# Patient Record
Sex: Female | Born: 1940
Health system: Southern US, Community
[De-identification: ages and names within clinical notes are randomized; demographics above are authoritative.]

## PROBLEM LIST (undated history)

## (undated) DIAGNOSIS — I739 Peripheral vascular disease, unspecified: Secondary | ICD-10-CM

## (undated) DIAGNOSIS — D329 Benign neoplasm of meninges, unspecified: Secondary | ICD-10-CM

## (undated) DIAGNOSIS — E785 Hyperlipidemia, unspecified: Secondary | ICD-10-CM

## (undated) DIAGNOSIS — I639 Cerebral infarction, unspecified: Secondary | ICD-10-CM

## (undated) DIAGNOSIS — I4891 Unspecified atrial fibrillation: Secondary | ICD-10-CM

## (undated) DIAGNOSIS — L97929 Non-pressure chronic ulcer of unspecified part of left lower leg with unspecified severity: Secondary | ICD-10-CM

## (undated) DIAGNOSIS — C50919 Malignant neoplasm of unspecified site of unspecified female breast: Secondary | ICD-10-CM

## (undated) DIAGNOSIS — R0989 Other specified symptoms and signs involving the circulatory and respiratory systems: Secondary | ICD-10-CM

## (undated) DIAGNOSIS — L97919 Non-pressure chronic ulcer of unspecified part of right lower leg with unspecified severity: Secondary | ICD-10-CM

## (undated) DIAGNOSIS — I10 Essential (primary) hypertension: Secondary | ICD-10-CM

## (undated) DIAGNOSIS — M479 Spondylosis, unspecified: Secondary | ICD-10-CM

## (undated) DIAGNOSIS — G458 Other transient cerebral ischemic attacks and related syndromes: Secondary | ICD-10-CM

## (undated) DIAGNOSIS — D649 Anemia, unspecified: Secondary | ICD-10-CM

## (undated) DIAGNOSIS — J189 Pneumonia, unspecified organism: Secondary | ICD-10-CM

## (undated) DIAGNOSIS — I6529 Occlusion and stenosis of unspecified carotid artery: Secondary | ICD-10-CM

## (undated) HISTORY — DX: Other specified symptoms and signs involving the circulatory and respiratory systems: R09.89

## (undated) HISTORY — PX: EXPLORATORY LAPAROTOMY: SUR591

## (undated) HISTORY — DX: Other transient cerebral ischemic attacks and related syndromes: G45.8

## (undated) HISTORY — DX: Essential (primary) hypertension: I10

## (undated) HISTORY — DX: Malignant neoplasm of unspecified site of unspecified female breast: C50.919

## (undated) HISTORY — DX: Occlusion and stenosis of unspecified carotid artery: I65.29

## (undated) HISTORY — DX: Peripheral vascular disease, unspecified: I73.9

## (undated) HISTORY — DX: Hyperlipidemia, unspecified: E78.5

## (undated) HISTORY — DX: Cerebral infarction, unspecified: I63.9

## (undated) HISTORY — DX: Benign neoplasm of meninges, unspecified: D32.9

## (undated) HISTORY — PX: LUMBAR EPIDURAL INJECTION: SHX1980

## (undated) HISTORY — DX: Anemia, unspecified: D64.9

## (undated) HISTORY — PX: EYE SURGERY: SHX253

## (undated) HISTORY — DX: Unspecified atrial fibrillation: I48.91

## (undated) HISTORY — PX: CARDIAC CATHETERIZATION: SHX172

---

## 1988-04-09 HISTORY — PX: MASTECTOMY: SHX3

## 2006-08-10 ENCOUNTER — Inpatient Hospital Stay (HOSPITAL_COMMUNITY): Admission: EM | Admit: 2006-08-10 | Discharge: 2006-08-13 | Payer: Self-pay | Admitting: Emergency Medicine

## 2006-08-11 ENCOUNTER — Encounter: Payer: Self-pay | Admitting: Neurology

## 2006-08-12 ENCOUNTER — Encounter (INDEPENDENT_AMBULATORY_CARE_PROVIDER_SITE_OTHER): Payer: Self-pay | Admitting: Cardiology

## 2006-08-23 ENCOUNTER — Ambulatory Visit (HOSPITAL_COMMUNITY): Admission: RE | Admit: 2006-08-23 | Discharge: 2006-08-23 | Payer: Self-pay | Admitting: Interventional Radiology

## 2006-09-05 ENCOUNTER — Encounter: Payer: Self-pay | Admitting: Interventional Radiology

## 2006-09-09 ENCOUNTER — Encounter (HOSPITAL_COMMUNITY): Admission: RE | Admit: 2006-09-09 | Discharge: 2006-10-09 | Payer: Self-pay | Admitting: Internal Medicine

## 2006-11-26 ENCOUNTER — Ambulatory Visit (HOSPITAL_COMMUNITY): Admission: RE | Admit: 2006-11-26 | Discharge: 2006-11-26 | Payer: Self-pay | Admitting: Interventional Radiology

## 2006-12-17 ENCOUNTER — Ambulatory Visit (HOSPITAL_COMMUNITY): Admission: RE | Admit: 2006-12-17 | Discharge: 2006-12-17 | Payer: Self-pay | Admitting: Internal Medicine

## 2007-01-08 ENCOUNTER — Ambulatory Visit (HOSPITAL_COMMUNITY): Admission: RE | Admit: 2007-01-08 | Discharge: 2007-01-08 | Payer: Self-pay | Admitting: Internal Medicine

## 2007-07-22 ENCOUNTER — Ambulatory Visit (HOSPITAL_COMMUNITY): Admission: RE | Admit: 2007-07-22 | Discharge: 2007-07-22 | Payer: Self-pay | Admitting: Internal Medicine

## 2007-08-06 ENCOUNTER — Ambulatory Visit (HOSPITAL_COMMUNITY): Admission: RE | Admit: 2007-08-06 | Discharge: 2007-08-06 | Payer: Self-pay | Admitting: Orthopedic Surgery

## 2007-08-26 ENCOUNTER — Encounter (HOSPITAL_COMMUNITY): Admission: RE | Admit: 2007-08-26 | Discharge: 2007-09-25 | Payer: Self-pay | Admitting: Internal Medicine

## 2007-09-26 ENCOUNTER — Encounter (HOSPITAL_COMMUNITY): Admission: RE | Admit: 2007-09-26 | Discharge: 2007-10-26 | Payer: Self-pay | Admitting: Internal Medicine

## 2007-12-19 ENCOUNTER — Ambulatory Visit (HOSPITAL_COMMUNITY): Admission: RE | Admit: 2007-12-19 | Discharge: 2007-12-19 | Payer: Self-pay | Admitting: Internal Medicine

## 2008-01-15 ENCOUNTER — Ambulatory Visit (HOSPITAL_COMMUNITY): Admission: RE | Admit: 2008-01-15 | Discharge: 2008-01-15 | Payer: Self-pay | Admitting: Internal Medicine

## 2009-02-15 ENCOUNTER — Ambulatory Visit (HOSPITAL_COMMUNITY): Admission: RE | Admit: 2009-02-15 | Discharge: 2009-02-15 | Payer: Self-pay | Admitting: Internal Medicine

## 2009-06-28 ENCOUNTER — Ambulatory Visit (HOSPITAL_COMMUNITY): Admission: RE | Admit: 2009-06-28 | Discharge: 2009-06-28 | Payer: Self-pay | Admitting: Internal Medicine

## 2009-07-01 ENCOUNTER — Ambulatory Visit (HOSPITAL_COMMUNITY)
Admission: RE | Admit: 2009-07-01 | Discharge: 2009-07-01 | Payer: Self-pay | Source: Home / Self Care | Admitting: Internal Medicine

## 2009-07-18 ENCOUNTER — Ambulatory Visit: Payer: Self-pay | Admitting: Surgery

## 2009-10-10 ENCOUNTER — Ambulatory Visit: Payer: Self-pay | Admitting: Cardiology

## 2009-10-10 ENCOUNTER — Inpatient Hospital Stay (HOSPITAL_COMMUNITY): Admission: EM | Admit: 2009-10-10 | Discharge: 2009-10-20 | Payer: Self-pay | Admitting: Emergency Medicine

## 2009-10-11 ENCOUNTER — Telehealth: Payer: Self-pay | Admitting: Cardiovascular Disease

## 2009-10-12 ENCOUNTER — Encounter (INDEPENDENT_AMBULATORY_CARE_PROVIDER_SITE_OTHER): Payer: Self-pay | Admitting: Internal Medicine

## 2009-10-12 ENCOUNTER — Ambulatory Visit: Payer: Self-pay | Admitting: Vascular Surgery

## 2009-10-12 ENCOUNTER — Encounter: Payer: Self-pay | Admitting: Cardiovascular Disease

## 2009-10-15 ENCOUNTER — Encounter: Payer: Self-pay | Admitting: Cardiovascular Disease

## 2009-10-17 ENCOUNTER — Encounter: Payer: Self-pay | Admitting: Cardiovascular Disease

## 2009-10-20 ENCOUNTER — Telehealth: Payer: Self-pay | Admitting: Cardiovascular Disease

## 2009-10-20 ENCOUNTER — Encounter: Payer: Self-pay | Admitting: Cardiovascular Disease

## 2009-10-24 ENCOUNTER — Ambulatory Visit: Payer: Self-pay | Admitting: Cardiology

## 2009-10-26 ENCOUNTER — Ambulatory Visit: Payer: Self-pay | Admitting: Cardiovascular Disease

## 2009-10-26 DIAGNOSIS — I4819 Other persistent atrial fibrillation: Secondary | ICD-10-CM | POA: Insufficient documentation

## 2009-10-26 DIAGNOSIS — R55 Syncope and collapse: Secondary | ICD-10-CM | POA: Insufficient documentation

## 2009-10-26 DIAGNOSIS — I4891 Unspecified atrial fibrillation: Secondary | ICD-10-CM

## 2009-10-26 DIAGNOSIS — I5033 Acute on chronic diastolic (congestive) heart failure: Secondary | ICD-10-CM | POA: Insufficient documentation

## 2009-10-26 DIAGNOSIS — I10 Essential (primary) hypertension: Secondary | ICD-10-CM | POA: Insufficient documentation

## 2009-10-26 DIAGNOSIS — E785 Hyperlipidemia, unspecified: Secondary | ICD-10-CM | POA: Insufficient documentation

## 2009-10-27 ENCOUNTER — Encounter: Payer: Self-pay | Admitting: Cardiovascular Disease

## 2009-10-28 ENCOUNTER — Telehealth: Payer: Self-pay | Admitting: Cardiovascular Disease

## 2009-10-31 ENCOUNTER — Ambulatory Visit: Payer: Self-pay | Admitting: Cardiology

## 2009-11-01 DIAGNOSIS — R0989 Other specified symptoms and signs involving the circulatory and respiratory systems: Secondary | ICD-10-CM

## 2009-11-04 ENCOUNTER — Telehealth: Payer: Self-pay | Admitting: Cardiovascular Disease

## 2009-11-07 ENCOUNTER — Ambulatory Visit: Payer: Self-pay | Admitting: Cardiology

## 2009-11-21 ENCOUNTER — Ambulatory Visit: Payer: Self-pay | Admitting: Cardiology

## 2009-12-02 ENCOUNTER — Emergency Department (HOSPITAL_COMMUNITY): Admission: EM | Admit: 2009-12-02 | Discharge: 2009-12-02 | Payer: Self-pay | Admitting: Emergency Medicine

## 2009-12-05 ENCOUNTER — Ambulatory Visit: Payer: Self-pay | Admitting: Cardiology

## 2009-12-05 LAB — CONVERTED CEMR LAB: POC INR: 2.2

## 2009-12-19 ENCOUNTER — Ambulatory Visit: Payer: Self-pay | Admitting: Surgery

## 2009-12-19 ENCOUNTER — Encounter: Payer: Self-pay | Admitting: Cardiovascular Disease

## 2009-12-26 ENCOUNTER — Ambulatory Visit: Payer: Self-pay | Admitting: Cardiology

## 2009-12-26 LAB — CONVERTED CEMR LAB: POC INR: 2.1

## 2009-12-27 ENCOUNTER — Encounter: Payer: Self-pay | Admitting: Cardiovascular Disease

## 2009-12-29 ENCOUNTER — Encounter: Payer: Self-pay | Admitting: Cardiovascular Disease

## 2010-01-23 ENCOUNTER — Ambulatory Visit: Payer: Self-pay | Admitting: Cardiology

## 2010-01-23 LAB — CONVERTED CEMR LAB: POC INR: 3.6

## 2010-01-24 ENCOUNTER — Ambulatory Visit: Payer: Self-pay | Admitting: Cardiovascular Disease

## 2010-02-02 ENCOUNTER — Telehealth: Payer: Self-pay | Admitting: Cardiovascular Disease

## 2010-02-13 ENCOUNTER — Ambulatory Visit: Payer: Self-pay | Admitting: Cardiology

## 2010-02-13 LAB — CONVERTED CEMR LAB: POC INR: 3.7

## 2010-02-20 ENCOUNTER — Ambulatory Visit (HOSPITAL_COMMUNITY)
Admission: RE | Admit: 2010-02-20 | Discharge: 2010-02-20 | Payer: Self-pay | Source: Home / Self Care | Admitting: Internal Medicine

## 2010-03-06 ENCOUNTER — Ambulatory Visit: Payer: Self-pay | Admitting: Cardiology

## 2010-03-06 LAB — CONVERTED CEMR LAB: POC INR: 1.3

## 2010-03-13 ENCOUNTER — Ambulatory Visit: Payer: Self-pay | Admitting: Cardiology

## 2010-03-13 LAB — CONVERTED CEMR LAB: POC INR: 2.8

## 2010-04-05 ENCOUNTER — Ambulatory Visit: Payer: Self-pay | Admitting: Cardiology

## 2010-04-05 LAB — CONVERTED CEMR LAB: POC INR: 2.5

## 2010-04-30 ENCOUNTER — Encounter: Payer: Self-pay | Admitting: Internal Medicine

## 2010-05-03 ENCOUNTER — Ambulatory Visit: Admission: RE | Admit: 2010-05-03 | Discharge: 2010-05-03 | Payer: Self-pay | Source: Home / Self Care

## 2010-05-04 ENCOUNTER — Encounter (HOSPITAL_COMMUNITY)
Admission: RE | Admit: 2010-05-04 | Discharge: 2010-05-09 | Payer: Self-pay | Source: Home / Self Care | Attending: Internal Medicine | Admitting: Internal Medicine

## 2010-05-09 NOTE — Medication Information (Signed)
Summary: ccr-lr  Anticoagulant Therapy  Managed by: Vashti Hey, RN Referring MD: Eden Emms MD, Theron Arista PCP: Louanne Skye Supervising MD: Dietrich Pates MD, Molly Maduro Indication 1: Atrial Fibrillation Indication 2: Cerebrovascular Accident Lab Used: [facility] [place] Site: [address] INR POC 1.8 INR RANGE 2.0-3.0  Dietary changes: no    Health status changes: no    Bleeding/hemorrhagic complications: no    Recent/future hospitalizations: no    Any changes in medication regimen? no    Recent/future dental: no  Any missed doses?: no       Is patient compliant with meds? yes       Allergies: 1)  ! * Providone-Iodine 2)  ! Iodine 3)  ! * Contrast Dye  Anticoagulation Management History:      The patient is taking warfarin and comes in today for a routine follow up visit.  Positive risk factors for bleeding include an age of 72 years or older.  The bleeding index is 'intermediate risk'.  Positive CHADS2 values include History of CHF and History of HTN.  Negative CHADS2 values include Age > 72 years old.  Anticoagulation responsible provider: Dietrich Pates MD, Molly Maduro.  INR POC: 1.8.  Cuvette Lot#: [id number].  Exp: 01/2011.    Anticoagulation Management Assessment/Plan:      The patient's current anticoagulation dose is Coumadin 4 mg tabs: use as directed per Anticoagulation Clinic.  The next INR is due 11/21/2009.  Anticoagulation instructions were given to patient.  Results were reviewed/authorized by Vashti Hey, RN.  She was notified by Vashti Hey RN.         Prior Anticoagulation Instructions: INR 3.2 Decrease coumadin to 1 tablet once daily except 1/2 tablet on Mondays, Wednesdays and Fridays  Current Anticoagulation Instructions: INR 1.8 Increase coumadin to 4mg  once daily except 2mg  on Wednesdays

## 2010-05-09 NOTE — Letter (Signed)
Summary: [facility] OFFICE NOTE 12/19/09  [facility] OFFICE NOTE 12/19/09   Imported By: Faythe Ghee 12/29/2009 14:20:02  _____________________________________________________________________  External Attachment:    Type:   Image     Comment:   External Document

## 2010-05-09 NOTE — Medication Information (Signed)
Summary: ccr-lr  Anticoagulant Therapy  Managed by: Vashti Hey, RN Referring MD: Eden Emms MD, Theron Arista PCP: Louanne Skye Supervising MD: Diona Browner MD, Remi Deter Indication 1: Atrial Fibrillation Indication 2: Cerebrovascular Accident Lab Used: [facility] [place] Site: [place] INR POC 1.3 INR RANGE 2.0-3.0  Dietary changes: no    Health status changes: no    Bleeding/hemorrhagic complications: no    Recent/future hospitalizations: no    Any changes in medication regimen? no    Recent/future dental: no  Any missed doses?: no       Is patient compliant with meds? yes       Allergies: 1)  ! * Providone-Iodine 2)  ! Iodine 3)  ! * Contrast Dye  Anticoagulation Management History:      The patient is taking warfarin and comes in today for a routine follow up visit.  Positive risk factors for bleeding include an age of 72 years or older.  The bleeding index is 'intermediate risk'.  Positive CHADS2 values include History of CHF and History of HTN.  Negative CHADS2 values include Age > 50 years old.  Anticoagulation responsible provider: Diona Browner MD, Remi Deter.  INR POC: 1.3.  Cuvette Lot#: [id number].  Exp: 01/2011.    Anticoagulation Management Assessment/Plan:      The patient's current anticoagulation dose is Coumadin 4 mg tabs: use as directed per Anticoagulation Clinic.  The target INR is 2.0-3.0.  The next INR is due 03/13/2010.  Anticoagulation instructions were given to patient.  Results were reviewed/authorized by Vashti Hey, RN.  She was notified by Vashti Hey RN.         Prior Anticoagulation Instructions: INR 3.7 Hold coumadin tonight then decrease dose to 4mg  once daily except 2mg  on Mondays, Wednesdays and Fridays  Current Anticoagulation Instructions: INR 1.3 Take coumadin 1 1/2 tablets tonight then increase dose to 4mg  once daily except 2mg  on Mondays and Fridays

## 2010-05-09 NOTE — Progress Notes (Signed)
Summary: calling regarding dental work on Monday/ calling back   Phone Note Call from Patient Call back at [employer] [phone]   Caller: Patient Summary of Call: Pt having dental work on 02/06/10 and pt need clearance on what meds pt need to take call Dr.Hugh Dowdy # [phone] Initial call taken by: Judie Grieve,  February 02, 2010 9:07 AM  Follow-up for Phone Call        Left message to call back Deliah Goody, RN  February 02, 2010 3:33 PM   per brenda calling back office [phone] Lorne Skeens  February 03, 2010 8:48 AM   office closed at 1pm. Left message to call back Deliah Goody, RN  February 03, 2010 1:47 PM   Additional Follow-up for Phone Call Additional follow up Details #1::        spoke with brenda, okay given, per dr Eden Emms note, for pt to hold coumadin and plavix prior to dental procedures Deliah Goody, RN  February 06, 2010 9:21 AM

## 2010-05-09 NOTE — Medication Information (Signed)
Summary: ccr-lr  Anticoagulant Therapy  Managed by: Vashti Hey, RN Referring MD: Eden Emms MD, Theron Arista PCP: Louanne Skye Supervising MD: Dietrich Pates MD, Molly Maduro Indication 1: Atrial Fibrillation Indication 2: Cerebrovascular Accident Lab Used: [facility] [place] Site: [address] INR POC 2.1 INR RANGE 2.0-3.0  Dietary changes: no    Health status changes: no    Bleeding/hemorrhagic complications: no    Recent/future hospitalizations: no    Any changes in medication regimen? no    Recent/future dental: no  Any missed doses?: no       Is patient compliant with meds? yes       Allergies: 1)  ! * Providone-Iodine 2)  ! Iodine 3)  ! * Contrast Dye  Anticoagulation Management History:      The patient is taking warfarin and comes in today for a routine follow up visit.  Positive risk factors for bleeding include an age of 15 years or older.  The bleeding index is 'intermediate risk'.  Positive CHADS2 values include History of CHF and History of HTN.  Negative CHADS2 values include Age > 32 years old.  Anticoagulation responsible provider: Dietrich Pates MD, Molly Maduro.  INR POC: 2.1.  Cuvette Lot#: [id number].  Exp: 01/2011.    Anticoagulation Management Assessment/Plan:      The patient's current anticoagulation dose is Coumadin 4 mg tabs: use as directed per Anticoagulation Clinic.  The target INR is 2.0-3.0.  The next INR is due 01/23/2010.  Anticoagulation instructions were given to patient.  Results were reviewed/authorized by Vashti Hey, RN.  She was notified by Vashti Hey RN.         Prior Anticoagulation Instructions: INR 2.2 Continue coumadin 4mg  once daily except 2mg  on Wednesdays  Current Anticoagulation Instructions: INR 2.1 Continue coumadin 4mg  once daily except 2mg  on Wednesdays

## 2010-05-09 NOTE — Medication Information (Signed)
Summary: ccr-lr  Anticoagulant Therapy  Managed by: Vashti Hey, RN Referring MD: Eden Emms MD, Theron Arista PCP: Louanne Skye Supervising MD: Dietrich Pates MD, Molly Maduro Indication 1: Atrial Fibrillation Indication 2: Cerebrovascular Accident Lab Used: [facility] [place] Site: [address] INR POC 2.2 INR RANGE 2.0-3.0  Dietary changes: no    Health status changes: no    Bleeding/hemorrhagic complications: no    Recent/future hospitalizations: no    Any changes in medication regimen? no    Recent/future dental: no  Any missed doses?: no       Is patient compliant with meds? yes       Allergies: 1)  ! * Providone-Iodine 2)  ! Iodine 3)  ! * Contrast Dye  Anticoagulation Management History:      The patient is taking warfarin and comes in today for a routine follow up visit.  Positive risk factors for bleeding include an age of 72 years or older.  The bleeding index is 'intermediate risk'.  Positive CHADS2 values include History of CHF and History of HTN.  Negative CHADS2 values include Age > 46 years old.  Anticoagulation responsible provider: Dietrich Pates MD, Molly Maduro.  INR POC: 2.2.  Cuvette Lot#: [id number].  Exp: 01/2011.    Anticoagulation Management Assessment/Plan:      The patient's current anticoagulation dose is Coumadin 4 mg tabs: use as directed per Anticoagulation Clinic.  The target INR is 2.0-3.0.  The next INR is due 12/26/2009.  Anticoagulation instructions were given to patient.  Results were reviewed/authorized by Vashti Hey, RN.  She was notified by Vashti Hey RN.         Prior Anticoagulation Instructions: INR 2.3 Continue coumadin 4mg  once daily except 2mg  on Wednesdays  Current Anticoagulation Instructions: INR 2.2 Continue coumadin 4mg  once daily except 2mg  on Wednesdays

## 2010-05-09 NOTE — Medication Information (Signed)
Summary: ccr-lr  Anticoagulant Therapy  Managed by: Vashti Hey, RN Referring MD: Eden Emms MD, Theron Arista PCP: Louanne Skye Supervising MD: Dietrich Pates MD, Molly Maduro Indication 1: Atrial Fibrillation Indication 2: Cerebrovascular Accident Lab Used: [facility] [place] Site: [place] INR POC 3.7 INR RANGE 2.0-3.0  Dietary changes: no    Health status changes: no    Bleeding/hemorrhagic complications: no    Recent/future hospitalizations: no    Any changes in medication regimen? no    Recent/future dental: no  Any missed doses?: no       Is patient compliant with meds? yes       Allergies: 1)  ! * Providone-Iodine 2)  ! Iodine 3)  ! * Contrast Dye  Anticoagulation Management History:      The patient is taking warfarin and comes in today for a routine follow up visit.  Positive risk factors for bleeding include an age of 72 years or older.  The bleeding index is 'intermediate risk'.  Positive CHADS2 values include History of CHF and History of HTN.  Negative CHADS2 values include Age > 72 years old.  Anticoagulation responsible Darria Corvera: Dietrich Pates MD, Molly Maduro.  INR POC: 3.7.  Cuvette Lot#: [id number].  Exp: 01/2011.    Anticoagulation Management Assessment/Plan:      The patient's current anticoagulation dose is Coumadin 4 mg tabs: use as directed per Anticoagulation Clinic.  The target INR is 2.0-3.0.  The next INR is due 03/06/2010.  Anticoagulation instructions were given to patient.  Results were reviewed/authorized by Vashti Hey, RN.  She was notified by Vashti Hey RN.         Prior Anticoagulation Instructions: INR 3.6 Hold coumadin tonight then resume 1 tablet once daily except 1/2 tablet on Wednesdays  Current Anticoagulation Instructions: INR 3.7 Hold coumadin tonight then decrease dose to 4mg  once daily except 2mg  on Mondays, Wednesdays and Fridays

## 2010-05-09 NOTE — Letter (Signed)
Summary: [facility]   Imported By: Roderic Ovens 11/07/2009 15:31:31  _____________________________________________________________________  External Attachment:    Type:   Image     Comment:   External Document

## 2010-05-09 NOTE — Letter (Signed)
Summary: CAROTID DUPLEX 12/19/09  CAROTID DUPLEX 12/19/09   Imported By: Faythe Ghee 12/29/2009 14:20:29  _____________________________________________________________________  External Attachment:    Type:   Image     Comment:   External Document

## 2010-05-09 NOTE — Medication Information (Signed)
Summary: ccr  Anticoagulant Therapy  Managed by: Vashti Hey, RN Referring MD: Eden Emms MD, Theron Arista PCP: Louanne Skye Supervising MD: Dietrich Pates MD, Molly Maduro Indication 1: Atrial Fibrillation Indication 2: Cerebrovascular Accident Lab Used: [facility] [place] Site: [address] INR POC 3.2 INR RANGE 2.0-3.0  Dietary changes: no    Health status changes: yes       Details: nausea when taking pills   told to take with food  Bleeding/hemorrhagic complications: no    Recent/future hospitalizations: no    Any changes in medication regimen? no    Recent/future dental: no  Any missed doses?: no       Is patient compliant with meds? yes       Allergies: 1)  ! * Providone-Iodine 2)  ! Iodine 3)  ! * Contrast Dye  Anticoagulation Management History:      The patient is taking warfarin and comes in today for a routine follow up visit.  Positive risk factors for bleeding include an age of 72 years or older.  The bleeding index is 'intermediate risk'.  Positive CHADS2 values include History of CHF and History of HTN.  Negative CHADS2 values include Age > 72 years old.  Anticoagulation responsible provider: Dietrich Pates MD, Molly Maduro.  INR POC: 3.2.  Cuvette Lot#: [id number].  Exp: 01/2011.    Anticoagulation Management Assessment/Plan:      The patient's current anticoagulation dose is Coumadin 4 mg tabs: use as directed per Anticoagulation Clinic.  The next INR is due 11/07/2009.  Anticoagulation instructions were given to patient/spouse.  Results were reviewed/authorized by Vashti Hey, RN.  She was notified by Vashti Hey RN.        Coagulation management information includes: On amiodarone 200mg  bid.  Prior Anticoagulation Instructions: INR 2.2 Continue 4mg s everyday. Recheck INR in one week at [facility] office.  Discontinue Lovenox injections.   Current Anticoagulation Instructions: INR 3.2 Decrease coumadin to 1 tablet once daily except 1/2 tablet on Mondays, Wednesdays and Fridays

## 2010-05-09 NOTE — Progress Notes (Signed)
Summary: pt needs carotid  Phone Note Call from Patient Call back at Home Phone [phone]   Caller: Patient Reason for Call: Talk to Nurse, Talk to Doctor Summary of Call: pt was seen by Dr. Eden Emms in the [place] office on 7/20 her next appt with him is 10/18 @ 9am. Pt explained that Dr. Eden Emms told her to have a carotid study done at Dr. Myra Gianotti office but patient was told she needed a referral and I explained that we could do that study here at out office but she said that is not what Dr. Eden Emms wanted. I explained to patient I did not know why so I would send a message and we would ask Dr. Eden Emms. Initial call taken by: Omer Jack,  October 28, 2009 4:26 PM  Follow-up for Phone Call        Spoke with pt. Patient states Dr. Eden Emms agreed for  pt. to have a Carotid duplex that  Dr. Linnell Fulling recommeded for pt. to have one in his office. According to pt. Dr. Dwana Melena from [place]  referred pt. to Dr. Linnell Fulling Vascular vein specialist  in april/11.  Because Dr. Eden Emms wants the  test  be done before Oct.18th his office told pt. she needs a referral from Dr. Fabio Bering opffice. The vascular office is closed at this time. I let pt. know we  will  call next week with referral. Pt. verbalized understanding.  Follow-up by: Ollen Gross, RN, BSN,  October 28, 2009 5:26 PM  Additional Follow-up for Phone Call Additional follow up Details #1::        Patient should have a referral to [facility] from vascular/CVTS for known inominate stenosis and bilateral carotid disease.  Would lkie her to have carotid and subclavian duplex at his office so he can review.  Can use my name as referral. Additional Follow-up by: Colon Branch, MD, [facility],  October 29, 2009 3:33 PM     Appended Document: pt needs carotid    Clinical Lists Changes  Problems: Added new problem of CAROTID STENOSIS (ICD-433.10) Added new problem of CAROTID ARTERY STENOSIS, BILATERAL (ICD-433.10) Orders: Added new Referral order of  [facility] Referral ([facility] Ref) - Signed      Appended Document: pt needs carotid LMOM giving instructions that we will call with appt date and time for Dr. Lear Ng

## 2010-05-09 NOTE — Medication Information (Signed)
Summary: ccr-lr  Anticoagulant Therapy  Managed by: Vashti Hey, RN Referring MD: Eden Emms MD, Theron Arista PCP: Louanne Skye Supervising MD: Diona Browner MD, Remi Deter Indication 1: Atrial Fibrillation Indication 2: Cerebrovascular Accident Lab Used: [facility] [place] Site: [place] INR POC 2.8 INR RANGE 2.0-3.0           Allergies: 1)  ! * Providone-Iodine 2)  ! Iodine 3)  ! * Contrast Dye  Anticoagulation Management History:      The patient is taking warfarin and comes in today for a routine follow up visit.  Positive risk factors for bleeding include an age of 72 years or older.  The bleeding index is 'intermediate risk'.  Positive CHADS2 values include History of CHF and History of HTN.  Negative CHADS2 values include Age > 72 years old.  Anticoagulation responsible provider: Diona Browner MD, Remi Deter.  INR POC: 2.8.  Cuvette Lot#: [id number].  Exp: 01/2011.    Anticoagulation Management Assessment/Plan:      The patient's current anticoagulation dose is Coumadin 4 mg tabs: use as directed per Anticoagulation Clinic.  The target INR is 2.0-3.0.  The next INR is due 04/05/2010.  Anticoagulation instructions were given to patient.  Results were reviewed/authorized by Vashti Hey, RN.  She was notified by Vashti Hey RN.         Prior Anticoagulation Instructions: INR 1.3 Take coumadin 1 1/2 tablets tonight then increase dose to 4mg  once daily except 2mg  on Mondays and Fridays  Current Anticoagulation Instructions: INR 2.8 Continue coumadin 4mg  once daily except 2mg  on Mondays and Fridays

## 2010-05-09 NOTE — Medication Information (Signed)
Summary: rov/sp  Anticoagulant Therapy  Managed by: Bethena Midget, RN, BSN Referring MD: Eden Emms MD, Lavera Guise MD: Antoine Poche MD, Fayrene Fearing Indication 1: Atrial Fibrillation Indication 2: Cerebrovascular Accident Lab Used: [facility] [place] Site: [address] INR POC 2.2 INR RANGE 2.0-3.0          Comments: Discharged home on Thursday. Started on 7/11 with 4gms daily. Discharged home on Lovenox two times a day 60mg s. Discharged home on New meds: Norvasc, Amiodarone 200mg s two times a day, Metoprolol 75mg s BID  Current Medications (verified): 1)  Amiodarone Hcl 200 Mg Tabs (Amiodarone Hcl) .... Take 1 Tablet By Mouth Twice Daily 2)  Norvasc 5 Mg Tabs (Amlodipine Besylate) .... Take 1 Tablet By Mouth Daily 3)  Norco 5-325 Mg Tabs (Hydrocodone-Acetaminophen) .... Take 1 Tablet By Mouth Every 6 Hours As Needed 4)  Metoprolol Tartrate 25 Mg Tabs (Metoprolol Tartrate) .... Take 3 Tablets By Mouth Twice Daily 5)  Aspirin 81 Mg Tbec (Aspirin) .... Take 1 Tablet By Mouth Every Morning 6)  Calcium-Vitamin D 250-125 Mg-Unit Tabs (Calcium Carbonate-Vitamin D) .... Take 1 Tablet By Mouth Twice Daily 7)  Fosamax 70 Mg Tabs (Alendronate Sodium) .... Take 1 Tablet By Mouth Weekly 8)  Mobic 15 Mg Tabs (Meloxicam) .... Take 1 Tablet By Mouth Every Morning 9)  Multivitamins  Caps (Multiple Vitamin) .... Take 1 Tablet By Mouth Once Daily 10)  Plavix 75 Mg Tabs (Clopidogrel Bisulfate) .... Take One Tablet By Mouth Every Morning 11)  Pravachol 40 Mg Tabs (Pravastatin Sodium) .... Take 1 Tablet By Mouth At Bedtime 12)  Ascorbic Acid 500 Mg Tabs (Ascorbic Acid) .... Take 1 Tablet By Mouth Every Morning 13)  Welchol 625 Mg Tabs (Colesevelam Hcl) .... Take 3 Tablets By Mouth Twice Daily  Allergies (verified): 1)  ! * Providone-Iodine 2)  ! Iodine 3)  ! * Contrast Dye  Anticoagulation Management History:      The patient comes in today for her initial visit for anticoagulation  therapy.  Positive risk factors for bleeding include an age of 72 years or older.  The bleeding index is 'intermediate risk'.  Negative CHADS2 values include Age > 75 years old.  Anticoagulation responsible provider: Antoine Poche MD, Fayrene Fearing.  INR POC: 2.2.  Cuvette Lot#: [id number].  Exp: 01/2011.    Anticoagulation Management Assessment/Plan:      The next INR is due 10/31/2009.  Anticoagulation instructions were given to patient/spouse.  Results were reviewed/authorized by Bethena Midget, RN, BSN.  She was notified by Bethena Midget, RN, BSN.         Current Anticoagulation Instructions: INR 2.2 Continue 4mg s everyday. Recheck INR in one week at [facility] office.  Discontinue Lovenox injections.

## 2010-05-09 NOTE — Assessment & Plan Note (Signed)
Summary: f51m/jml   Primary Chrishonda Hesch:  Timothy Lasso Hall,MD   History of Present Illness: Brittley is seen post hospital D/C  It appears that she was consulted on by Dr Daleen Squibb and cathed by Dr Alice Reichert.  She has known vascular disease primarily in the sublavian and carotids.  She was admitted with syncope.  She had PAF with diastolic heart failure.  Her cath showed only diagonal disease with no critical major vessel disease.  EF was normal.  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  She has 60-79% stenosis on left ICA.  In combination with rapid atrial fibrillation this may have contributed to a brief loss of consdousness.  Her diastolic CHF cleared with restoration of NSR.  She was sent home on low dose amiodarone.  She has had no SOB, or palpitations since being home. there have been no bleeding problems.  She denies SSCP, she wants to get back to her gym activities  She needs a root canal in the near future.  She should be ok with this but Plavix and coumadin will likely need to be held 3-5 days ahead of time.  I think she can have sedation in office but she is chronically on Norco so lower doses should be used.  Current Problems (verified): 1)  Carotid Artery Stenosis, Bilateral  (ICD-433.10) 2)  Carotid Stenosis  (ICD-433.10) 3)  Acute On Chronic Diastolic Heart Failure  (ICD-428.33) 4)  Atrial Fibrillation  (ICD-427.31) 5)  Hyperlipidemia  (ICD-272.4) 6)  Hypertension  (ICD-401.9) 7)  Syncope and Collapse  (ICD-780.2)  Current Medications (verified): 1)  Amiodarone Hcl 200 Mg Tabs (Amiodarone Hcl) .... Take 1 Tablet By Mouth Twice Daily 2)  Norvasc 5 Mg Tabs (Amlodipine Besylate) .... Take 1 Tablet By Mouth Daily 3)  Norco 5-325 Mg Tabs (Hydrocodone-Acetaminophen) .... Take 1 Tablet By Mouth Every 6 Hours As Needed 4)  Metoprolol Tartrate 25 Mg Tabs (Metoprolol Tartrate) .... Take 3 Tablets By Mouth Twice Daily 5)  Aspirin 81  Mg Tbec (Aspirin) .... Take 1 Tablet By Mouth Every Morning 6)  Calcium-Vitamin D 250-125 Mg-Unit Tabs (Calcium Carbonate-Vitamin D) .... Take 1 Tablet By Mouth Twice Daily 7)  Fosamax 70 Mg Tabs (Alendronate Sodium) .... Take 1 Tablet By Mouth Weekly 8)  Mobic 15 Mg Tabs (Meloxicam) .... Take 1 Tablet By Mouth Every Morning 9)  Multivitamins  Caps (Multiple Vitamin) .... Take 1 Tablet By Mouth Once Daily 10)  Plavix 75 Mg Tabs (Clopidogrel Bisulfate) .... Take One Tablet By Mouth Every Morning 11)  Pravachol 40 Mg Tabs (Pravastatin Sodium) .... Take 1 Tablet By Mouth At Bedtime 12)  Ascorbic Acid 500 Mg Tabs (Ascorbic Acid) .... Take 1 Tablet By Mouth Every Morning 13)  Welchol 625 Mg Tabs (Colesevelam Hcl) .... Take 3 Tablets By Mouth Twice Daily 14)  Coumadin 4 Mg Tabs (Warfarin Sodium) .... Use As Directed Per Anticoagulation Clinic 15)  Fish Oil 1200 Mg Caps (Omega-3 Fatty Acids) .... Take 1 Tablet By Mouth Once A Day 16)  Diovan 160 Mg Tabs (Valsartan) .... Take One Tablet By Mouth Daily  Allergies (verified): 1)  ! * Providone-Iodine 2)  ! Iodine 3)  ! * Contrast Dye  Past History:  Past Medical History: Last updated: 10/26/2009  1. Hypertension.   2. Osteoporosis.   3. Hyperlipidemia.   4. History of right frontal CVA in 2008 followed by Dr. Sandria Manly.   5. History  of breast cancer status post left mastectomy.   6. History of left occipital meningioma.   7. 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  The patient also noted to have low       blood pressure in the right arm.      Past Surgical History: Last updated: 2009/11/03 Cardiac Catheterization  Family History: Last updated: Nov 03, 2009     Her mother died at 59 from complications of Alzheimer   dementia.  Father died at 16 from complications of Parkinson disease.   Social History: Last  updated: 11/03/09     The patient is married.  She has 2 children.  She moved   from [place] to [place].  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  She quit tobacco after she had a stroke and now she drinks   occasional alcohol.   Review of Systems       Denies fever, malais, weight loss, blurry vision, decreased visual acuity, cough, sputum, SOB, hemoptysis, pleuritic pain, palpitaitons, heartburn, abdominal pain, melena, lower extremity edema, claudication, or rash.   Vital Signs:  Patient profile:   70 year old female Height:      65 inches Weight:      127 pounds BMI:     21.21 Pulse rate:   72 / minute Resp:     12 per minute BP sitting:   180 / 98  (left arm)  Vitals Entered By: Kem Parkinson (January 24, 2010 9:10 AM)  Physical Exam  General:  Affect appropriate Healthy:  appears stated age HEENT: normal Neck supple with no adenopathy JVP normal left  bruits no thyromegaly Lungs clear with no wheezing and good diaphragmatic motion Heart:  S1/S2 no murmur,rub, gallop or click PMI normal Abdomen: benighn, BS positve, no tenderness, no AAA no bruit.  No HSM or HJR Distal pulses intact with no bruits No edema Neuro non-focal Skin warm and dry Tremor   Impression & Recommendations:  Problem # 1:  CAROTID ARTERY STENOSIS, BILATERAL (ICD-433.10) 60-79% left ICA  F/U duplex VVS in 6 months Her updated medication list for this problem includes:    Aspirin 81 Mg Tbec (Aspirin) .Marland Kitchen... Take 1 tablet by mouth every morning    Plavix 75 Mg Tabs (Clopidogrel bisulfate) .Marland Kitchen... Take one tablet by mouth every morning    Coumadin 4 Mg Tabs (Warfarin sodium) ..... Use as directed per anticoagulation clinic  Problem # 2:  ACUTE ON CHRONIC DIASTOLIC HEART FAILURE (ICD-428.33) Resolved Continue Rx BP and maint of NSR will be key Her updated medication list for this problem includes:    Amiodarone Hcl 200 Mg Tabs (Amiodarone hcl) .Marland Kitchen... Take 1 tablet by mouth  twice daily    Norvasc 5 Mg Tabs (Amlodipine besylate) .Marland Kitchen... Take 1 tablet by mouth daily    Metoprolol Tartrate 25 Mg Tabs (Metoprolol tartrate) .Marland Kitchen... Take 3 tablets by mouth twice daily    Aspirin 81 Mg Tbec (Aspirin) .Marland Kitchen... Take 1 tablet by mouth every morning    Plavix 75 Mg Tabs (Clopidogrel bisulfate) .Marland Kitchen... Take one tablet by mouth every morning    Coumadin 4 Mg Tabs (Warfarin sodium) ..... Use as directed per anticoagulation clinic    Diovan 160 Mg Tabs (Valsartan) .Marland Kitchen... Take one tablet by mouth daily  Problem # 3:  ATRIAL FIBRILLATION (ICD-427.31) Continue low dose amiodarone.  LFT's Thyroid and DLCO in 6 months Her updated medication list for this problem includes:    Amiodarone Hcl 200 Mg Tabs (Amiodarone hcl) .Marland Kitchen... Take 1 tablet by mouth twice daily    Metoprolol Tartrate 25 Mg Tabs (Metoprolol tartrate) .Marland Kitchen... Take 3 tablets by mouth twice daily    Aspirin 81 Mg Tbec (Aspirin) .Marland Kitchen... Take 1 tablet by mouth every morning    Plavix 75 Mg Tabs (Clopidogrel bisulfate) .Marland Kitchen... Take one tablet by mouth every morning    Coumadin 4 Mg Tabs (Warfarin sodium) ..... Use as directed per anticoagulation clinic  Problem # 4:  HYPERLIPIDEMIA (ICD-272.4) Well controlled Her updated medication list for this problem includes:    Pravachol 40 Mg Tabs (Pravastatin sodium) .Marland Kitchen... Take 1 tablet by mouth at bedtime    Welchol 625 Mg Tabs (Colesevelam hcl) .Marland Kitchen... Take 3 tablets by mouth twice daily  Problem # 5:  HYPERTENSION (ICD-401.9) Continieu Diovan and low sodium diet Her updated medication list for this problem includes:    Norvasc 5 Mg Tabs (Amlodipine besylate) .Marland Kitchen... Take 1 tablet by mouth daily    Metoprolol Tartrate 25 Mg Tabs (Metoprolol tartrate) .Marland Kitchen... Take 3 tablets by mouth twice daily    Aspirin 81 Mg Tbec (Aspirin) .Marland Kitchen... Take 1 tablet by mouth every morning    Diovan 160 Mg Tabs (Valsartan) .Marland Kitchen... Take one tablet by mouth daily  Problem # 6:  SYNCOPE AND COLLAPSE (ICD-780.2) Non  recurrent.  At cerebral risk with right innominate occlusion and Left ICA diseae.  No significant arrtythmias  Aviod dehydration Her updated medication list for this problem includes:    Amiodarone Hcl 200 Mg Tabs (Amiodarone hcl) .Marland Kitchen... Take 1 tablet by mouth twice daily    Norvasc 5 Mg Tabs (Amlodipine besylate) .Marland Kitchen... Take 1 tablet by mouth daily    Metoprolol Tartrate 25 Mg Tabs (Metoprolol tartrate) .Marland Kitchen... Take 3 tablets by mouth twice daily    Aspirin 81 Mg Tbec (Aspirin) .Marland Kitchen... Take 1 tablet by mouth every morning    Plavix 75 Mg Tabs (Clopidogrel bisulfate) .Marland Kitchen... Take one tablet by mouth every morning    Coumadin 4 Mg Tabs (Warfarin sodium) ..... Use as directed per anticoagulation clinic  Patient Instructions: 1)  Your physician recommends that you schedule a follow-up appointment in:  6 MONTHS WITH DR Eden Emms 2)  Your physician recommends that you continue on your current medications as directed. Please refer to the Current Medication list given to you today. Prescriptions: DIOVAN 160 MG TABS (VALSARTAN) Take one tablet by mouth daily  #90 x 3   Entered by:   Scherrie Bateman, LPN   Authorized by:   Colon Branch, MD, [facility]   Signed by:   Scherrie Bateman, LPN on 04/54/0981   Method used:   Electronically to        [facility]* (mail-order)       [address]       [place], [place]  [address]       Ph: [phone]       Fax: [phone]   RxID:   [id number]

## 2010-05-09 NOTE — Medication Information (Signed)
Summary: ccr-lr  Anticoagulant Therapy  Managed by: Colleen Hey, RN Referring MD: Eden Emms MD, Theron Arista PCP: Louanne Skye Supervising MD: Dietrich Pates MD, Molly Maduro Indication 1: Atrial Fibrillation Indication 2: Cerebrovascular Accident Lab Used: [facility] [place] Site: [address] INR POC 2.3 INR RANGE 2.0-3.0  Dietary changes: no    Health status changes: no    Bleeding/hemorrhagic complications: no    Recent/future hospitalizations: no    Any changes in medication regimen? no    Recent/future dental: no  Any missed doses?: no       Is patient compliant with meds? yes       Allergies: 1)  ! * Providone-Iodine 2)  ! Iodine 3)  ! * Contrast Dye  Anticoagulation Management History:      The patient is taking warfarin and comes in today for a routine follow up visit.  Positive risk factors for bleeding include an age of 20 years or older.  The bleeding index is 'intermediate risk'.  Positive CHADS2 values include History of CHF and History of HTN.  Negative CHADS2 values include Age > 63 years old.  Anticoagulation responsible Berenize Gatlin: Dietrich Pates MD, Molly Maduro.  INR POC: 2.3.  Cuvette Lot#: [id number].  Exp: 01/2011.    Anticoagulation Management Assessment/Plan:      The patient's current anticoagulation dose is Coumadin 4 mg tabs: use as directed per Anticoagulation Clinic.  The target INR is 2.0-3.0.  The next INR is due 12/05/2009.  Anticoagulation instructions were given to patient.  Results were reviewed/authorized by Colleen Hey, RN.  She was notified by Colleen Hey RN.         Prior Anticoagulation Instructions: INR 1.8 Increase coumadin to 4mg  once daily except 2mg  on Wednesdays   Current Anticoagulation Instructions: INR 2.3 Continue coumadin 4mg  once daily except 2mg  on Wednesdays

## 2010-05-09 NOTE — Medication Information (Signed)
Summary: ccr-lr  Anticoagulant Therapy  Managed by: Vashti Hey, RN Referring MD: Eden Emms MD, Theron Arista PCP: Louanne Skye Supervising MD: Diona Browner MD, Remi Deter Indication 1: Atrial Fibrillation Indication 2: Cerebrovascular Accident Lab Used: [facility] [place] Site: [address] INR POC 3.6 INR RANGE 2.0-3.0  Dietary changes: no    Health status changes: no    Bleeding/hemorrhagic complications: no    Recent/future hospitalizations: no    Any changes in medication regimen? no    Recent/future dental: no  Any missed doses?: no       Is patient compliant with meds? yes       Allergies: 1)  ! * Providone-Iodine 2)  ! Iodine 3)  ! * Contrast Dye  Anticoagulation Management History:      The patient is taking warfarin and comes in today for a routine follow up visit.  Positive risk factors for bleeding include an age of 72 years or older.  The bleeding index is 'intermediate risk'.  Positive CHADS2 values include History of CHF and History of HTN.  Negative CHADS2 values include Age > 72 years old.  Anticoagulation responsible Jamai Dolce: Diona Browner MD, Remi Deter.  INR POC: 3.6.  Cuvette Lot#: [id number].  Exp: 01/2011.    Anticoagulation Management Assessment/Plan:      The patient's current anticoagulation dose is Coumadin 4 mg tabs: use as directed per Anticoagulation Clinic.  The target INR is 2.0-3.0.  The next INR is due 02/13/2010.  Anticoagulation instructions were given to patient.  Results were reviewed/authorized by Vashti Hey, RN.  She was notified by Vashti Hey RN.         Prior Anticoagulation Instructions: INR 2.1 Continue coumadin 4mg  once daily except 2mg  on Wednesdays  Current Anticoagulation Instructions: INR 3.6 Hold coumadin tonight then resume 1 tablet once daily except 1/2 tablet on Wednesdays

## 2010-05-09 NOTE — Assessment & Plan Note (Signed)
Summary: eph/per pt request/jml   Visit Type:  post cath Primary Provider:  Zack Hall,MD   History of Present Illness: [name] is seen post hospital D/C  It appears that she was consulted on by Dr Daleen Squibb and cathed by Dr Alice Reichert.  She has known vascular disease primarily in the sublavian and carotids.  She was admitted with syncope.  She had PAF with diastolic heart failure.  Her cath showed only diagonal disease with no critical major vessel disease.  EF was normal.  She has F/U with Dr Myra Gianotti in October.  She has an occluded inominate on the right and had failed previous stenting attempts.  She has 60-79% stenosis on left ICA.  In combination with rapid atrial fibrillation this may have contributed to a brief loss of consdousness.  Her diastolic CHF cleared with restoration of NSR.  She was sent home on low dose amiodarone.  She has had no SOB, or palpitations since being home.  Her INR on Monday in the [place] office was 2.2.  She will see Misty Stanley in [place] this Monday.  there have been no bleeding problems.  She denies SSCP, she wants to get back to her gym activities.  I told her from a cardiac perspective there was no reason to prohibit driving since she is in NSR, has normal EF and no critical CAD.  However she will have to have her primary or neuro sign off on driving sine she had a "syncopal" episode  Preventive Screening-Counseling & Management  Alcohol-Tobacco     Smoking Status: quit     Year Quit: 2008     Pack years: 1 &1/2 PPD x's 47yrs  Current Problems (verified): None  Current Medications (verified): 1)  Amiodarone Hcl 200 Mg Tabs (Amiodarone Hcl) .... Take 1 Tablet By Mouth Twice Daily 2)  Norvasc 5 Mg Tabs (Amlodipine Besylate) .... Take 1 Tablet By Mouth Daily 3)  Norco 5-325 Mg Tabs (Hydrocodone-Acetaminophen) .... Take 1 Tablet By Mouth Every 6 Hours As Needed 4)  Metoprolol Tartrate 25 Mg Tabs (Metoprolol Tartrate) .... Take 3 Tablets By Mouth Twice Daily 5)   Aspirin 81 Mg Tbec (Aspirin) .... Take 1 Tablet By Mouth Every Morning 6)  Calcium-Vitamin D 250-125 Mg-Unit Tabs (Calcium Carbonate-Vitamin D) .... Take 1 Tablet By Mouth Twice Daily 7)  Fosamax 70 Mg Tabs (Alendronate Sodium) .... Take 1 Tablet By Mouth Weekly 8)  Mobic 15 Mg Tabs (Meloxicam) .... Take 1 Tablet By Mouth Every Morning 9)  Multivitamins  Caps (Multiple Vitamin) .... Take 1 Tablet By Mouth Once Daily 10)  Plavix 75 Mg Tabs (Clopidogrel Bisulfate) .... Take One Tablet By Mouth Every Morning 11)  Pravachol 40 Mg Tabs (Pravastatin Sodium) .... Take 1 Tablet By Mouth At Bedtime 12)  Ascorbic Acid 500 Mg Tabs (Ascorbic Acid) .... Take 1 Tablet By Mouth Every Morning 13)  Welchol 625 Mg Tabs (Colesevelam Hcl) .... Take 3 Tablets By Mouth Twice Daily 14)  Coumadin 4 Mg Tabs (Warfarin Sodium) .... Use As Directed Per Anticoagulation Clinic 15)  Fish Oil 1200 Mg Caps (Omega-3 Fatty Acids) .... Take 1 Tablet By Mouth Once A Day  Allergies (verified): 1)  ! * Providone-Iodine 2)  ! Iodine 3)  ! * Contrast Dye  Comments:  Nurse/Medical Assistant: The patient's medication list and allergies were reviewed with the patient and were updated in the Medication and Allergy Lists.  Past History:  Past Medical History: CVA PAF Diastolic Dysfunciton Right inominate occlusion Breast CA Hypercholesterolemia  Social History: Smoking Status:  quit Pack years:  1 &1/2 PPD x's 4yrs  Vital Signs:  Patient profile:   70 year old female Height:      65 inches Weight:      127 pounds BMI:     21.21 Pulse rate:   77 / minute BP sitting:   121 / 74  (left arm) Cuff size:   large  Vitals Entered By: Carlye Grippe (October 26, 2009 9:47 AM)  Physical Exam  General:  Affect appropriate Healthy:  appears stated age HEENT: normal Neck supple with no adenopathy JVP normal bilateral carotid and subclavian bruits no thyromegaly Lungs clear with no wheezing and good diaphragmatic  motion Heart:  S1/S2 no murmur,rub, gallop or click PMI normal Abdomen: benighn, BS positve, no tenderness, no AAA no bruit.  No HSM or HJR Distal pulses intact with no bruits No edema Neuro non-focal Skin warm and dry BP 80mm Hg on right arm and 120 mmHg on left    EKG  Procedure date:  10/12/2009  Findings:       Study Conclusions    - Left ventricle: The cavity size was normal. Wall thickness was     increased in a pattern of mild LVH. Systolic function was normal.     The estimated ejection fraction was in the range of 55% to 60%.     Diastolic function was indeterminant. Wall motion was normal;     there were no regional wall motion abnormalities.   - Aortic valve: There was no stenosis.   - Mitral valve: Mild regurgitation.   - Left atrium: The atrium was mildly dilated.   - Right ventricle: The cavity size was normal. Systolic function was     normal.   - Right atrium: The atrium was mildly dilated.   - Tricuspid valve: Peak RV-RA gradient: 34mm Hg (S).   - Pulmonary arteries: Based on estimated RA pressure of 16-20 mmHg     from dilated IVC, PA systolic pressure would be 50-54 mmHg and PA     diastolic pressure would be 26-30 mmHg.   - Systemic veins: The IVC was dilated to 2.7 cm with some     respirophasic variation, suggesting RA pressure 16-20 mmHg.   Impressions:    - Normal LV size with mild LV hypertrophy. EF 55-60% without wall     motion abnormalities. The RV was normal in size and systolic     function. The IVC was dilated to 2.7 cm. There was estimated to be     moderate pulmonary hypertension (PA systolic pressure 50-54 mmHg).    -------------------------------------------------------------  ABI's  Procedure date:  10/12/2009  Findings:      --------------------------------------------------------------------   Summary:   Right - There appears to be an stenosis versis an occlusion proximal   to the origin of the common carotid artery. Doppler  waveforms are   abnormal with reduction of velocity throughout the extracranial   carotid system. There is a patent vertebral artery with retrograde   flow consistent with the known subclavian steal. Cannot rule out   that the vertebral is feeding the common. Left - There is a 60% to   79% ICA stenosis at the origin and mid ICA. Difficult to evaluate   the velocity due to possible increase also being somewhat due to   compensatory flow. The vertebral artery flow is antegrade.   Other specific details can be found in the table(s) above.  Prepared  and Electronically Authenticated by    Gretta Began, MD  CXR  Procedure date:  10/13/2009  Findings:         IMPRESSION:   Interval resolution of the bibasilar airspace opacities.   Hyperexpansion.  VQ Scan  Procedure date:  10/10/2009  Findings:       IMPRESSION:    1.  Low probability for pulmonary embolus.   2.  Minimal air trapping at the right upper lung zone, with small   focal ventilation defect about the right hilum.    Read By:  Carita Ghent,  M.D.  Impression & Recommendations:  Problem # 1:  SYNCOPE AND COLLAPSE (ICD-780.2) Needs further f/u for her vascular disease.  I believe she has appt with Dr Edilia Bo.  Right inominate occlusion with moderate LICA stenosis.  Reverse flow documented on right side with BP differential Her updated medication list for this problem includes:    Amiodarone Hcl 200 Mg Tabs (Amiodarone hcl) .Marland Kitchen... Take 1 tablet by mouth twice daily    Norvasc 5 Mg Tabs (Amlodipine besylate) .Marland Kitchen... Take 1 tablet by mouth daily    Metoprolol Tartrate 25 Mg Tabs (Metoprolol tartrate) .Marland Kitchen... Take 3 tablets by mouth twice daily    Aspirin 81 Mg Tbec (Aspirin) .Marland Kitchen... Take 1 tablet by mouth every morning    Plavix 75 Mg Tabs (Clopidogrel bisulfate) .Marland Kitchen... Take one tablet by mouth every morning    Coumadin 4 Mg Tabs (Warfarin sodium) ..... Use as directed per anticoagulation clinic  Problem # 2:  ATRIAL  FIBRILLATION (ICD-427.31) Maint NSR continue amiodarone Her updated medication list for this problem includes:    Amiodarone Hcl 200 Mg Tabs (Amiodarone hcl) .Marland Kitchen... Take 1 tablet by mouth twice daily    Metoprolol Tartrate 25 Mg Tabs (Metoprolol tartrate) .Marland Kitchen... Take 3 tablets by mouth twice daily    Aspirin 81 Mg Tbec (Aspirin) .Marland Kitchen... Take 1 tablet by mouth every morning    Plavix 75 Mg Tabs (Clopidogrel bisulfate) .Marland Kitchen... Take one tablet by mouth every morning    Coumadin 4 Mg Tabs (Warfarin sodium) ..... Use as directed per anticoagulation clinic  Problem # 3:  ACUTE ON CHRONIC DIASTOLIC HEART FAILURE (ICD-428.33) Flair usually with rapid afib.  So long as she maint NSR may not need diuretic Her updated medication list for this problem includes:    Amiodarone Hcl 200 Mg Tabs (Amiodarone hcl) .Marland Kitchen... Take 1 tablet by mouth twice daily    Norvasc 5 Mg Tabs (Amlodipine besylate) .Marland Kitchen... Take 1 tablet by mouth daily    Metoprolol Tartrate 25 Mg Tabs (Metoprolol tartrate) .Marland Kitchen... Take 3 tablets by mouth twice daily    Aspirin 81 Mg Tbec (Aspirin) .Marland Kitchen... Take 1 tablet by mouth every morning    Plavix 75 Mg Tabs (Clopidogrel bisulfate) .Marland Kitchen... Take one tablet by mouth every morning    Coumadin 4 Mg Tabs (Warfarin sodium) ..... Use as directed per anticoagulation clinic  Problem # 4:  HYPERTENSION (ICD-401.9) Well controlled.  BP should be taken on left arm despite previous mastectomy as left side is lower from inominate occlusion Her updated medication list for this problem includes:    Norvasc 5 Mg Tabs (Amlodipine besylate) .Marland Kitchen... Take 1 tablet by mouth daily    Metoprolol Tartrate 25 Mg Tabs (Metoprolol tartrate) .Marland Kitchen... Take 3 tablets by mouth twice daily    Aspirin 81 Mg Tbec (Aspirin) .Marland Kitchen... Take 1 tablet by mouth every morning  Problem # 5:  HYPERLIPIDEMIA (ICD-272.4) Continue 2 drug Rx given known vascular  disease Her updated medication list for this problem includes:    Pravachol 40 Mg Tabs  (Pravastatin sodium) .Marland Kitchen... Take 1 tablet by mouth at bedtime    Welchol 625 Mg Tabs (Colesevelam hcl) .Marland Kitchen... Take 3 tablets by mouth twice daily  Patient Instructions: 1)  Your physician recommends that you schedule a follow-up appointment in: 3 month   Echocardiogram Report  Procedure date:  10/26/2009  Findings:      NSR 70 Nonspecific ST/T wave changes

## 2010-05-09 NOTE — Progress Notes (Signed)
Summary: refill  Phone Note Refill Request Message from:  Patient on November 04, 2009 1:07 PM  Refills Requested: Medication #1:  WELCHOL 625 MG TABS take 3 tablets by mouth twice daily  Medication #2:  MOBIC 15 MG TABS take 1 tablet by mouth every morning  Medication #3:  METOPROLOL TARTRATE 25 MG TABS take 3 tablets by mouth twice daily  Medication #4:  NORVASC 5 MG TABS take 1 tablet by mouth daily Prescription  [facility] [phone] opt 1 ref  #[id number]  Initial call taken by: Judie Grieve,  November 04, 2009 1:12 PM  Follow-up for Phone Call        called pt husband answered states she sleep so i told him we filled meds excpet mobic primary care md has to fill this for her Follow-up by: Kem Parkinson,  November 04, 2009 2:22 PM    Prescriptions: NORVASC 5 MG TABS (AMLODIPINE BESYLATE) take 1 tablet by mouth daily  #90 x 3   Entered by:   Kem Parkinson   Authorized by:   Colon Branch, MD, [facility]   Signed by:   Kem Parkinson on 11/04/2009   Method used:   Electronically to        [facility] (mail-order)       [address]       [place], [place]  [address]       Ph: [phone]       Fax: [phone]   RxID:   [id number] METOPROLOL TARTRATE 25 MG TABS (METOPROLOL TARTRATE) take 3 tablets by mouth twice daily  #540 x 3   Entered by:   Kem Parkinson   Authorized by:   Colon Branch, MD, [facility]   Signed by:   Kem Parkinson on 11/04/2009   Method used:   Electronically to        [facility] (mail-order)       [address]       [place], [place]  [address]       Ph: [phone]       Fax: [phone]   RxID:   [id number] WELCHOL 625 MG TABS (COLESEVELAM HCL) take 3 tablets by mouth twice daily  #540 x 3   Entered by:   Kem Parkinson   Authorized by:   Colon Branch, MD, [facility]   Signed by:   Kem Parkinson on 11/04/2009   Method used:   Electronically to        [facility]* (mail-order)       [address], [place]  [address]       Ph: [phone]       Fax: [phone]   RxID:   [id number]

## 2010-05-09 NOTE — Progress Notes (Signed)
Summary: husband requesting dr Eden Emms see pt at [facility]  Phone Note Call from Patient   Caller: Spouse robert  Reason for Call: Talk to Nurse Summary of Call: pt aiyana stegmann is requesting that dr Eden Emms see his wife [name], she is at [facility] due to passing out yesterday, and was told she would need to see a cardiologist while at [facility] and pt is requesting dr Eden Emms to see her-is this possible? [phone] or [phone] Initial call taken by: Glynda Jaeger,  October 11, 2009 10:04 AM  Follow-up for Phone Call        spoke with pt husband, he is aware that dr Eden Emms is in [place] until thursday this week and would not be able to see them until after thursday. husband voiced understanding Deliah Goody, RN  October 11, 2009 2:36 PM

## 2010-05-09 NOTE — Progress Notes (Signed)
Summary: pt's husband wants pt seen asap  Phone Note Call from Patient   Caller: Spouse [name] Reason for Call: Talk to Nurse Summary of Call: pt being d/c from [facility] today-eph made for 8-24 w/Foy Mungia-husband doesn't want to wait that long, she is weak and lightheaded, no other symptomspls call [phone] Initial call taken by: Glynda Jaeger,  October 20, 2009 12:01 PM  Follow-up for Phone Call        Called patient's husband back and advised that if she does not feel well then he needs to speak with the staff at the hospital before she goes home. He states that he has already reported her condition to the hospital staff and called here  just  to see if Dr.Alireza Pollack could see her sooner than 8/24. I moved up her appointment with PN to 7/20 in the [facility] clinic.Marland KitchenVincy Haigler's husband aware. I called Rhonda PA at the hospital to report Patient's symptoms because she is still and INPATIENT at [facility]. Follow-up by: Suzan Garibaldi RN

## 2010-05-10 ENCOUNTER — Ambulatory Visit (HOSPITAL_COMMUNITY)
Admission: RE | Admit: 2010-05-10 | Discharge: 2010-05-10 | Disposition: A | Discharge status: Home / Self Care | Payer: Medicare Other | Source: Ambulatory Visit | Attending: Internal Medicine | Admitting: Internal Medicine

## 2010-05-10 DIAGNOSIS — L97309 Non-pressure chronic ulcer of unspecified ankle with unspecified severity: Secondary | ICD-10-CM | POA: Insufficient documentation

## 2010-05-10 DIAGNOSIS — IMO0001 Reserved for inherently not codable concepts without codable children: Secondary | ICD-10-CM | POA: Insufficient documentation

## 2010-05-11 NOTE — Medication Information (Signed)
Summary: ccr-lr  Anticoagulant Therapy  Managed by: Vashti Hey, RN Referring MD: Eden Emms MD, Theron Arista PCP: Louanne Skye Supervising MD: Dietrich Pates MD, Molly Maduro Indication 1: Atrial Fibrillation Indication 2: Cerebrovascular Accident Lab Used: [facility] [place] Site: [place] INR POC 2.5 INR RANGE 2.0-3.0  Dietary changes: no    Health status changes: no    Bleeding/hemorrhagic complications: no    Recent/future hospitalizations: no    Any changes in medication regimen? no    Recent/future dental: no  Any missed doses?: no       Is patient compliant with meds? yes       Allergies: 1)  ! * Providone-Iodine 2)  ! Iodine 3)  ! * Contrast Dye  Anticoagulation Management History:      The patient is taking warfarin and comes in today for a routine follow up visit.  Positive risk factors for bleeding include an age of 72 years or older.  The bleeding index is 'intermediate risk'.  Positive CHADS2 values include History of CHF and History of HTN.  Negative CHADS2 values include Age > 72 years old.  Anticoagulation responsible provider: Dietrich Pates MD, Molly Maduro.  INR POC: 2.5.  Cuvette Lot#: [id number].  Exp: 01/2011.    Anticoagulation Management Assessment/Plan:      The patient's current anticoagulation dose is Coumadin 4 mg tabs: use as directed per Anticoagulation Clinic.  The target INR is 2.0-3.0.  The next INR is due 05/03/2010.  Anticoagulation instructions were given to patient.  Results were reviewed/authorized by Vashti Hey, RN.  She was notified by Vashti Hey RN.         Prior Anticoagulation Instructions: INR 2.8 Continue coumadin 4mg  once daily except 2mg  on Mondays and Fridays  Current Anticoagulation Instructions: INR 2.5 Continue coumadin 4mg  once daily except 2mg  on Mondays and Fridays

## 2010-05-11 NOTE — Medication Information (Signed)
Summary: ccr-lr  Anticoagulant Therapy  Managed by: Vashti Hey, RN Referring MD: Eden Emms MD, Theron Arista PCP: Louanne Skye Supervising MD: Diona Browner MD, Remi Deter Indication 1: Atrial Fibrillation Indication 2: Cerebrovascular Accident Lab Used: [facility]  Site: [place] INR RANGE 2.0-3.0  Dietary changes: no    Health status changes: no    Bleeding/hemorrhagic complications: no    Recent/future hospitalizations: no    Any changes in medication regimen? no    Recent/future dental: no  Any missed doses?: no       Is patient compliant with meds? yes       Allergies: 1)  ! * Providone-Iodine 2)  ! Iodine 3)  ! * Contrast Dye  Anticoagulation Management History:      The patient is taking warfarin and comes in today for a routine follow up visit.  Positive risk factors for bleeding include an age of 73 years or older.  The bleeding index is 'intermediate risk'.  Positive CHADS2 values include History of CHF and History of HTN.  Negative CHADS2 values include Age > 33 years old.  Anticoagulation responsible provider: Diona Browner MD, Remi Deter.  Cuvette Lot#: [id number].  Exp: 01/2011.    Anticoagulation Management Assessment/Plan:      The patient's current anticoagulation dose is Coumadin 4 mg tabs: use as directed per Anticoagulation Clinic.  The target INR is 2.0-3.0.  The next INR is due 05/29/2010.  Anticoagulation instructions were given to patient.  Results were reviewed/authorized by Vashti Hey, RN.  She was notified by Vashti Hey RN.         Prior Anticoagulation Instructions: INR 2.5 Continue coumadin 4mg  once daily except 2mg  on Mondays and Fridays  Current Anticoagulation Instructions: INR 2.4 Continue coumadin 4mg  once daily except 2mg  on Mondays and Fridays

## 2010-05-12 ENCOUNTER — Ambulatory Visit (HOSPITAL_COMMUNITY)
Admission: RE | Admit: 2010-05-12 | Discharge: 2010-05-12 | Disposition: A | Discharge status: Home / Self Care | Payer: Medicare Other | Source: Ambulatory Visit | Attending: Internal Medicine | Admitting: Internal Medicine

## 2010-05-12 DIAGNOSIS — L97309 Non-pressure chronic ulcer of unspecified ankle with unspecified severity: Secondary | ICD-10-CM | POA: Insufficient documentation

## 2010-05-12 DIAGNOSIS — IMO0001 Reserved for inherently not codable concepts without codable children: Secondary | ICD-10-CM | POA: Insufficient documentation

## 2010-05-15 ENCOUNTER — Ambulatory Visit (HOSPITAL_COMMUNITY)
Admission: RE | Admit: 2010-05-15 | Discharge: 2010-05-15 | Disposition: A | Discharge status: Home / Self Care | Payer: Medicare Other | Source: Ambulatory Visit | Attending: Internal Medicine | Admitting: Internal Medicine

## 2010-05-15 DIAGNOSIS — S91009A Unspecified open wound, unspecified ankle, initial encounter: Secondary | ICD-10-CM | POA: Insufficient documentation

## 2010-05-15 DIAGNOSIS — S81009A Unspecified open wound, unspecified knee, initial encounter: Secondary | ICD-10-CM | POA: Insufficient documentation

## 2010-05-15 DIAGNOSIS — IMO0001 Reserved for inherently not codable concepts without codable children: Secondary | ICD-10-CM | POA: Insufficient documentation

## 2010-05-17 ENCOUNTER — Ambulatory Visit (HOSPITAL_COMMUNITY): Payer: Self-pay

## 2010-05-17 ENCOUNTER — Ambulatory Visit (HOSPITAL_COMMUNITY)
Admission: RE | Admit: 2010-05-17 | Discharge: 2010-05-17 | Disposition: A | Discharge status: Home / Self Care | Payer: Medicare Other | Source: Ambulatory Visit | Attending: Internal Medicine | Admitting: Internal Medicine

## 2010-05-17 DIAGNOSIS — IMO0001 Reserved for inherently not codable concepts without codable children: Secondary | ICD-10-CM | POA: Insufficient documentation

## 2010-05-17 DIAGNOSIS — S91009A Unspecified open wound, unspecified ankle, initial encounter: Secondary | ICD-10-CM | POA: Insufficient documentation

## 2010-05-17 DIAGNOSIS — S81009A Unspecified open wound, unspecified knee, initial encounter: Secondary | ICD-10-CM | POA: Insufficient documentation

## 2010-05-19 ENCOUNTER — Ambulatory Visit (HOSPITAL_COMMUNITY)
Admission: RE | Admit: 2010-05-19 | Discharge: 2010-05-19 | Disposition: A | Discharge status: Home / Self Care | Payer: Medicare Other | Source: Ambulatory Visit | Admitting: Physical Therapy

## 2010-05-22 ENCOUNTER — Ambulatory Visit (HOSPITAL_COMMUNITY)
Admission: RE | Admit: 2010-05-22 | Discharge: 2010-05-22 | Disposition: A | Discharge status: Home / Self Care | Payer: Medicare Other | Source: Ambulatory Visit | Attending: Internal Medicine | Admitting: Internal Medicine

## 2010-05-22 DIAGNOSIS — S81009A Unspecified open wound, unspecified knee, initial encounter: Secondary | ICD-10-CM | POA: Insufficient documentation

## 2010-05-22 DIAGNOSIS — IMO0001 Reserved for inherently not codable concepts without codable children: Secondary | ICD-10-CM | POA: Insufficient documentation

## 2010-05-24 ENCOUNTER — Ambulatory Visit (HOSPITAL_COMMUNITY)
Admission: RE | Admit: 2010-05-24 | Discharge: 2010-05-24 | Disposition: A | Discharge status: Home / Self Care | Payer: Medicare Other | Source: Ambulatory Visit | Attending: Internal Medicine | Admitting: Internal Medicine

## 2010-05-26 ENCOUNTER — Ambulatory Visit (HOSPITAL_COMMUNITY)
Admission: RE | Admit: 2010-05-26 | Discharge: 2010-05-26 | Disposition: A | Discharge status: Home / Self Care | Payer: Medicare Other | Source: Ambulatory Visit | Attending: *Deleted | Admitting: *Deleted

## 2010-05-26 LAB — WOUND CULTURE: Gram Stain: NONE SEEN

## 2010-05-29 ENCOUNTER — Encounter: Payer: Self-pay | Admitting: Cardiovascular Disease

## 2010-05-29 ENCOUNTER — Encounter (INDEPENDENT_AMBULATORY_CARE_PROVIDER_SITE_OTHER): Payer: Medicare Other

## 2010-05-29 ENCOUNTER — Ambulatory Visit (HOSPITAL_COMMUNITY)
Admission: RE | Admit: 2010-05-29 | Discharge: 2010-05-29 | Disposition: A | Discharge status: Home / Self Care | Payer: Medicare Other | Source: Ambulatory Visit | Attending: *Deleted | Admitting: *Deleted

## 2010-05-29 DIAGNOSIS — Z7901 Long term (current) use of anticoagulants: Secondary | ICD-10-CM

## 2010-05-29 DIAGNOSIS — I4891 Unspecified atrial fibrillation: Secondary | ICD-10-CM

## 2010-06-06 NOTE — Medication Information (Signed)
Summary: ccr-lr  Anticoagulant Therapy  Managed by: Vashti Hey, RN Referring MD: Eden Emms MD, Theron Arista PCP: Louanne Skye Supervising MD: Clifton James MD,Christopher Indication 1: Atrial Fibrillation Indication 2: Cerebrovascular Accident Lab Used: [facility] [place] Site: [place] INR POC 3.4 INR RANGE 2.0-3.0  Dietary changes: no    Health status changes: yes       Details: has wound on foot  getting debrided 3 x wk  Bleeding/hemorrhagic complications: no    Recent/future hospitalizations: no    Any changes in medication regimen? yes       Details: Been on Keflex 3 week   pending wound culture today   Abx may change  Recent/future dental: no  Any missed doses?: no       Is patient compliant with meds? yes       Allergies: 1)  ! * Providone-Iodine 2)  ! Iodine 3)  ! * Contrast Dye  Anticoagulation Management History:      The patient is taking warfarin and comes in today for a routine follow up visit.  Positive risk factors for bleeding include an age of 72 years or older.  The bleeding index is 'intermediate risk'.  Positive CHADS2 values include History of CHF and History of HTN.  Negative CHADS2 values include Age > 8 years old.  Anticoagulation responsible provider: Clifton James MD,Christopher.  INR POC: 3.4.  Exp: 01/2011.    Anticoagulation Management Assessment/Plan:      The patient's current anticoagulation dose is Coumadin 4 mg tabs: use as directed per Anticoagulation Clinic.  The target INR is 2.0-3.0.  The next INR is due 06/14/2010.  Anticoagulation instructions were given to patient.  Results were reviewed/authorized by Vashti Hey, RN.  She was notified by Vashti Hey RN.         Prior Anticoagulation Instructions: INR 2.4 Continue coumadin 4mg  once daily except 2mg  on Mondays and Fridays  Current Anticoagulation Instructions: INR 3.4 Been on Keflex.  Pending wound culture.  Abx might change. Decrease coumadin to 4mg  once daily except 2mg  on Mondays,  Wednesdays and Fridays

## 2010-06-08 ENCOUNTER — Ambulatory Visit (HOSPITAL_COMMUNITY)
Admission: RE | Admit: 2010-06-08 | Discharge: 2010-06-08 | Disposition: A | Discharge status: Home / Self Care | Payer: Medicare Other | Source: Ambulatory Visit | Attending: Internal Medicine | Admitting: Internal Medicine

## 2010-06-08 DIAGNOSIS — IMO0001 Reserved for inherently not codable concepts without codable children: Secondary | ICD-10-CM | POA: Insufficient documentation

## 2010-06-08 DIAGNOSIS — L97309 Non-pressure chronic ulcer of unspecified ankle with unspecified severity: Secondary | ICD-10-CM | POA: Insufficient documentation

## 2010-06-09 ENCOUNTER — Ambulatory Visit (HOSPITAL_COMMUNITY): Payer: Medicare Other | Admitting: Physical Therapy

## 2010-06-12 ENCOUNTER — Ambulatory Visit (HOSPITAL_COMMUNITY)
Admission: RE | Admit: 2010-06-12 | Discharge: 2010-06-12 | Disposition: A | Discharge status: Home / Self Care | Payer: Medicare Other | Source: Ambulatory Visit | Attending: *Deleted | Admitting: *Deleted

## 2010-06-14 ENCOUNTER — Encounter: Payer: Self-pay | Admitting: Cardiovascular Disease

## 2010-06-14 ENCOUNTER — Encounter (INDEPENDENT_AMBULATORY_CARE_PROVIDER_SITE_OTHER): Payer: Medicare Other

## 2010-06-14 ENCOUNTER — Ambulatory Visit (INDEPENDENT_AMBULATORY_CARE_PROVIDER_SITE_OTHER): Payer: Medicare Other | Admitting: Cardiovascular Disease

## 2010-06-14 DIAGNOSIS — I4891 Unspecified atrial fibrillation: Secondary | ICD-10-CM

## 2010-06-14 DIAGNOSIS — I10 Essential (primary) hypertension: Secondary | ICD-10-CM

## 2010-06-14 DIAGNOSIS — R0989 Other specified symptoms and signs involving the circulatory and respiratory systems: Secondary | ICD-10-CM

## 2010-06-14 DIAGNOSIS — Z7901 Long term (current) use of anticoagulants: Secondary | ICD-10-CM

## 2010-06-14 DIAGNOSIS — I6529 Occlusion and stenosis of unspecified carotid artery: Secondary | ICD-10-CM

## 2010-06-14 DIAGNOSIS — E785 Hyperlipidemia, unspecified: Secondary | ICD-10-CM

## 2010-06-14 NOTE — Patient Instructions (Signed)
F/U Dr Eden Emms 6 months F/U Dr Margo Aye for lab work F/U wound center for cellulitis F/U coumadin clinic per schedule

## 2010-06-14 NOTE — Assessment & Plan Note (Signed)
Well controlled Take BP in left arm given known inominate occlusion

## 2010-06-14 NOTE — Assessment & Plan Note (Signed)
Maint NSR  INR RX today.  Continue beta blocker

## 2010-06-14 NOTE — Progress Notes (Signed)
[name] is seen for F/U of PAF, bruit, right subclavian steal and anticoagulatin She has known vascular disease primarily in the sublavian and carotids.  She was admitted with syncope last year .  She had PAF with diastolic heart failure.  Her cath showed only diagonal disease with no critical major vessel disease.  EF was normal.  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  She has 60-79% stenosis on left ICA.  In combination with rapid atrial fibrillation this may have contributed to a brief loss of consdousness.  Her diastolic CHF cleared with restoration of NSR.  She was sent home on low dose amiodarone.  She has had no SOB, or palpitations since being home. there have been no bleeding problems.  She denies SSCP,  Has had an ulcer on the lateral aspect of the left ankle.  Seeing wound care center and has been on antibiotics This has made her coumadin hard to control.    Reviewed duplex from [facility] 9/1  60-79% LICA stenosis.  Innominate stenosis.     ROS:  denieas TIA, bleeding, palpitaitons, SOB, SSCP, falls, no fever or D/C from wound on leg.  Denies recurrent syncope  All other systems reviewed and negative

## 2010-06-14 NOTE — Assessment & Plan Note (Signed)
Has F/U duplex at [facility] this month.  Continue antiplatlet RX

## 2010-06-14 NOTE — Assessment & Plan Note (Signed)
Continue aggressive Rx with statin and welchol LABs per Dr Margo Aye

## 2010-06-15 ENCOUNTER — Ambulatory Visit (HOSPITAL_COMMUNITY)
Admission: RE | Admit: 2010-06-15 | Discharge: 2010-06-15 | Disposition: A | Discharge status: Home / Self Care | Payer: Medicare Other | Source: Ambulatory Visit | Attending: Internal Medicine | Admitting: Internal Medicine

## 2010-06-19 ENCOUNTER — Ambulatory Visit (HOSPITAL_COMMUNITY)
Admission: RE | Admit: 2010-06-19 | Discharge: 2010-06-19 | Disposition: A | Discharge status: Home / Self Care | Payer: Medicare Other | Source: Ambulatory Visit | Attending: Internal Medicine | Admitting: Internal Medicine

## 2010-06-20 NOTE — Medication Information (Signed)
Summary: ccr-lr  Anticoagulant Therapy  Managed by: Vashti Hey, RN Referring MD: Eden Emms MD, Theron Arista PCP: Louanne Skye Supervising MD: Eden Emms MD, Theron Arista Indication 1: Atrial Fibrillation Indication 2: Cerebrovascular Accident Lab Used: [facility] [place] Site: [place] INR POC 3.2 INR RANGE 2.0-3.0  Dietary changes: no    Health status changes: no    Bleeding/hemorrhagic complications: no    Recent/future hospitalizations: no    Any changes in medication regimen? yes       Details: On Doxycycline for staph infection in ankle  Recent/future dental: no  Any missed doses?: no       Is patient compliant with meds? yes       Allergies: 1)  ! * Providone-Iodine 2)  ! Iodine 3)  ! * Contrast Dye  Anticoagulation Management History:      The patient is taking warfarin and comes in today for a routine follow up visit.  Positive risk factors for bleeding include an age of 72 years or older.  The bleeding index is 'intermediate risk'.  Positive CHADS2 values include History of CHF and History of HTN.  Negative CHADS2 values include Age > 72 years old.  Anticoagulation responsible provider: Eden Emms MD, Theron Arista.  INR POC: 3.2.  Cuvette Lot#: [id number].  Exp: 01/2011.    Anticoagulation Management Assessment/Plan:      The patient's current anticoagulation dose is Coumadin 4 mg tabs: use as directed per Anticoagulation Clinic.  The target INR is 2.0-3.0.  The next INR is due 07/12/2010.  Anticoagulation instructions were given to patient.  Results were reviewed/authorized by Vashti Hey, RN.  She was notified by Vashti Hey RN.         Prior Anticoagulation Instructions: INR 3.4 Been on Keflex.  Pending wound culture.  Abx might change. Decrease coumadin to 4mg  once daily except 2mg  on Mondays, Wednesdays and Fridays  Current Anticoagulation Instructions: INR 3.2 Hold coumadin tonight then resume 4mg  once daily except 2mg  on Mondays, Wednesdays and Fridays Finished  Doxycycline last night.  Took diflucan last Friday

## 2010-06-20 NOTE — Assessment & Plan Note (Signed)
Summary: FOLLOW UP - 6 MONTHS   Visit Type:  Follow-up Primary Provider:  Zack Hall,MD  CC:  no cardiology complaints.  History of Present Illness: [name] is seen for F/U of PAF, bruit, right subclavian steal and anticoagulatin She has known vascular disease primarily in the sublavian and carotids.  She was admitted with syncope last year .  She had PAF with diastolic heart failure.  Her cath showed only diagonal disease with no critical major vessel disease.  EF was normal.  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  She has 60-79% stenosis on left ICA.  In combination with rapid atrial fibrillation this may have contributed to a brief loss of consdousness.  Her diastolic CHF cleared with restoration of NSR.  She was sent home on low dose amiodarone.  She has had no SOB, or palpitations since being home. there have been no bleeding problems.  She denies SSCP,  Has had an ulcer on the lateral aspect of the left ankle.  Seeing wound care center and has been on antibiotics This has made her coumadin hard to control.    Reviewed duplex from [facility] 9/1  60-79% LICA stenosis.  Innominate stenosis.    Current Problems (verified): 1)  Carotid Artery Stenosis, Bilateral  (ICD-433.10) 2)  Carotid Stenosis  (ICD-433.10) 3)  Acute On Chronic Diastolic Heart Failure  (ICD-428.33) 4)  Atrial Fibrillation  (ICD-427.31) 5)  Hyperlipidemia  (ICD-272.4) 6)  Hypertension  (ICD-401.9) 7)  Syncope and Collapse  (ICD-780.2)  Current Medications (verified): 1)  Amiodarone Hcl 200 Mg Tabs (Amiodarone Hcl) .... Take 1 Tablet By Mouth Twice Daily 2)  Norco 5-325 Mg Tabs (Hydrocodone-Acetaminophen) .... Take 1 Tablet By Mouth Every 6 Hours As Needed 3)  Metoprolol Tartrate 25 Mg Tabs (Metoprolol Tartrate) .... Take 3 Tablets By Mouth Twice Daily 4)  Aspirin 81 Mg Tbec (Aspirin) .... Take 1 Tablet By Mouth Every Morning 5)  Calcium-Vitamin D  250-125 Mg-Unit Tabs (Calcium Carbonate-Vitamin D) .... Take 1 Tablet By Mouth Twice Daily 6)  Fosamax 70 Mg Tabs (Alendronate Sodium) .... Take 1 Tablet By Mouth Weekly 7)  Mobic 15 Mg Tabs (Meloxicam) .... Take 1 Tablet By Mouth Every Morning 8)  Multivitamins  Caps (Multiple Vitamin) .... Take 1 Tablet By Mouth Once Daily 9)  Plavix 75 Mg Tabs (Clopidogrel Bisulfate) .... Take One Tablet By Mouth Every Morning 10)  Pravachol 40 Mg Tabs (Pravastatin Sodium) .... Take 1 Tablet By Mouth At Bedtime 11)  Ascorbic Acid 500 Mg Tabs (Ascorbic Acid) .... Take 1 Tablet By Mouth Every Morning 12)  Welchol 625 Mg Tabs (Colesevelam Hcl) .... Take 3 Tablets By Mouth Twice Daily 13)  Coumadin 4 Mg Tabs (Warfarin Sodium) .... Use As Directed Per Anticoagulation Clinic 14)  Fish Oil 1200 Mg Caps (Omega-3 Fatty Acids) .... Take 1 Tablet By Mouth Once A Day 15)  Exforge Hct 10-320-25 Mg Tabs (Amlodipine-Valsartan-Hctz) .... Take 1 Tab Daily  Allergies (verified): 1)  ! * Providone-Iodine 2)  ! Iodine 3)  ! * Contrast Dye  Comments:  Nurse/Medical Assistant: patient brought med list we reviewed she use [facility]  Past History:  Past Medical History: Last updated: 10/26/2009  1. Hypertension.   2. Osteoporosis.   3. Hyperlipidemia.   4. History of right frontal CVA in 2008 followed by Dr. Sandria Manly.   5. History of breast cancer status post  left mastectomy.   6. History of left occipital meningioma.   7. 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  The patient also noted to have low       blood pressure in the right arm.      Past Surgical History: Last updated: 11/04/2009 Cardiac Catheterization  Family History: Last updated: 2009/11/04     Her mother died at 60 from complications of Alzheimer   dementia.  Father died at 59  from complications of Parkinson disease.   Social History: Last updated: 2009/11/04     The patient is married.  She has 2 children.  She moved   from [place] to [place].  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  She quit tobacco after she had a stroke and now she drinks   occasional alcohol.   Review of Systems       Denies fever, malais, weight loss, blurry vision, decreased visual acuity, cough, sputum, SOB, hemoptysis, pleuritic pain, palpitaitons, heartburn, abdominal pain, melena, lower extremity edema, claudication, or rash.   Vital Signs:  Patient profile:   70 year old female Weight:      125 pounds BMI:     20.88 Pulse rate:   64 / minute BP sitting:   137 / 82  (left arm)  Vitals Entered By: Dreama Saa, CNA (June 14, 2010 8:46 AM)  Physical Exam  General:  Affect appropriate Healthy:  appears stated age HEENT: normal Neck supple with no adenopathy JVP normal left bruits no thyromegaly Lungs clear with no wheezing and good diaphragmatic motion Heart:  S1/S2 no murmur,rub, gallop or click PMI normal Abdomen: benighn, BS positve, no tenderness, no AAA no bruit.  No HSM or HJR Distal pulses intact with no bruits No edema Neuro non-focal Skin warm and dry Wound lateral aspect of left ankle   Impression & Recommendations:  Problem # 1:  CAROTID ARTERY STENOSIS, BILATERAL (ICD-433.10) Has duplex scheduled with [facility] next month  No TIA symptoms Her updated medication list for this problem includes:    Aspirin 81 Mg Tbec (Aspirin) .Marland Kitchen... Take 1 tablet by mouth every morning    Plavix 75 Mg Tabs (Clopidogrel bisulfate) .Marland Kitchen... Take one tablet by mouth every morning    Coumadin 4 Mg Tabs (Warfarin sodium) ..... Use as directed per anticoagulation clinic  Problem # 2:  ATRIAL FIBRILLATION (ICD-427.31) Main NSR.  Not keen on changing to pradaxa as she has had a stroke in 2008 and has low body mass Her updated medication list for this problem  includes:    Amiodarone Hcl 200 Mg Tabs (Amiodarone hcl) .Marland Kitchen... Take 1 tablet by mouth twice daily    Metoprolol Tartrate 25 Mg Tabs (Metoprolol tartrate) .Marland Kitchen... Take 3 tablets by mouth twice daily    Aspirin 81 Mg Tbec (Aspirin) .Marland Kitchen... Take 1 tablet by mouth every morning    Plavix 75 Mg Tabs (Clopidogrel bisulfate) .Marland Kitchen... Take one tablet by mouth every morning    Coumadin 4 Mg Tabs (Warfarin sodium) ..... Use as directed per anticoagulation clinic  Problem # 3:  HYPERTENSION (ICD-401.9) Well controlled  Should be checked in left arm with right inominate stenosis The following medications were removed from the medication list:    Norvasc 5 Mg Tabs (Amlodipine besylate) .Marland Kitchen... Take 1 tablet  by mouth daily    Diovan 320 Mg Tabs (Valsartan) .Marland Kitchen... Take 1 tablet once daily Her updated medication list for this problem includes:    Metoprolol Tartrate 25 Mg Tabs (Metoprolol tartrate) .Marland Kitchen... Take 3 tablets by mouth twice daily    Aspirin 81 Mg Tbec (Aspirin) .Marland Kitchen... Take 1 tablet by mouth every morning    Exforge Hct 10-320-25 Mg Tabs (Amlodipine-valsartan-hctz) .Marland Kitchen... Take 1 tab daily  Problem # 4:  HYPERLIPIDEMIA (ICD-272.4) Continue agressive Rx given vascular disease.  F/U labs Dr Margo Aye Her updated medication list for this problem includes:    Pravachol 40 Mg Tabs (Pravastatin sodium) .Marland Kitchen... Take 1 tablet by mouth at bedtime    Welchol 625 Mg Tabs (Colesevelam hcl) .Marland Kitchen... Take 3 tablets by mouth twice daily  Problem # 5:  COUMADIN THERAPY (ICD-V58.61) INR 3.4  slightly high due to antibiotics.  Dose adjusted by Misty Stanley in clinic  Patient Instructions: 1)  Your physician recommends that you schedule a follow-up appointment in: 6 months 2)  Your physician recommends that you continue on your current medications as directed. Please refer to the Current Medication list given to you today.

## 2010-06-22 ENCOUNTER — Ambulatory Visit (HOSPITAL_COMMUNITY)
Admission: RE | Admit: 2010-06-22 | Discharge: 2010-06-22 | Disposition: A | Discharge status: Home / Self Care | Payer: Medicare Other | Source: Ambulatory Visit | Attending: Internal Medicine | Admitting: Internal Medicine

## 2010-06-23 LAB — PROTIME-INR
INR: 1.83 — ABNORMAL HIGH (ref 0.00–1.49)
Prothrombin Time: 21.3 seconds — ABNORMAL HIGH (ref 11.6–15.2)

## 2010-06-23 LAB — CBC
Platelets: 331 10*3/uL (ref 150–400)
RBC: 4.01 MIL/uL (ref 3.87–5.11)
RDW: 14 % (ref 11.5–15.5)
WBC: 9.1 10*3/uL (ref 4.0–10.5)

## 2010-06-23 LAB — BASIC METABOLIC PANEL
BUN: 20 mg/dL (ref 6–23)
Calcium: 9 mg/dL (ref 8.4–10.5)
Creatinine, Ser: 0.82 mg/dL (ref 0.4–1.2)
GFR calc Af Amer: >60 mL/min (ref >60)
GFR calc non Af Amer: >60 mL/min (ref >60)

## 2010-06-23 LAB — URINALYSIS, ROUTINE W REFLEX MICROSCOPIC
Ketones, ur: NEGATIVE mg/dL
Nitrite: NEGATIVE
Urobilinogen, UA: 0.2 mg/dL (ref 0.0–1.0)
pH: 6 (ref 5.0–8.0)

## 2010-06-23 LAB — URINE CULTURE
Colony Count: 80000
Culture  Setup Time: 201108262153

## 2010-06-23 LAB — DIFFERENTIAL
Basophils Absolute: 0.1 10*3/uL (ref 0.0–0.1)
Eosinophils Relative: 2 % (ref 0–5)
Lymphocytes Relative: 27 % (ref 12–46)
Lymphs Abs: 2.4 10*3/uL (ref 0.7–4.0)
Neutrophils Relative %: 60 % (ref 43–77)

## 2010-06-23 LAB — GLUCOSE, CAPILLARY: Glucose-Capillary: 116 mg/dL — ABNORMAL HIGH (ref 70–99)

## 2010-06-23 LAB — URINE MICROSCOPIC-ADD ON

## 2010-06-25 LAB — URINALYSIS, ROUTINE W REFLEX MICROSCOPIC
Bilirubin Urine: NEGATIVE
Glucose, UA: NEGATIVE mg/dL
Hgb urine dipstick: NEGATIVE
Ketones, ur: 15 mg/dL — AB
Nitrite: NEGATIVE
Protein, ur: NEGATIVE mg/dL
Specific Gravity, Urine: 1.01 (ref 1.005–1.030)
Urobilinogen, UA: 0.2 mg/dL (ref 0.0–1.0)
pH: 5.5 (ref 5.0–8.0)

## 2010-06-25 LAB — BLOOD GAS, ARTERIAL
Bicarbonate: 24 mEq/L (ref 20.0–24.0)
Drawn by: 319961
O2 Saturation: 99.1 %
Patient temperature: 98.6
TCO2: 25 mmol/L (ref 0–100)
pCO2 arterial: 27 mmHg — ABNORMAL LOW (ref 35.0–45.0)
pH, Arterial: 7.485 — ABNORMAL HIGH (ref 7.350–7.400)
pO2, Arterial: 113 mmHg — ABNORMAL HIGH (ref 80.0–100.0)

## 2010-06-25 LAB — CBC
HCT: 27.6 % — ABNORMAL LOW (ref 36.0–46.0)
HCT: 30.9 % — ABNORMAL LOW (ref 36.0–46.0)
HCT: 34.5 % — ABNORMAL LOW (ref 36.0–46.0)
HCT: 35.8 % — ABNORMAL LOW (ref 36.0–46.0)
HCT: 36.2 % (ref 36.0–46.0)
HCT: 36.9 % (ref 36.0–46.0)
HCT: 38 % (ref 36.0–46.0)
Hemoglobin: 10.4 g/dL — ABNORMAL LOW (ref 12.0–15.0)
Hemoglobin: 10.5 g/dL — ABNORMAL LOW (ref 12.0–15.0)
Hemoglobin: 11.8 g/dL — ABNORMAL LOW (ref 12.0–15.0)
Hemoglobin: 12.5 g/dL (ref 12.0–15.0)
Hemoglobin: 12.9 g/dL (ref 12.0–15.0)
Hemoglobin: 9.2 g/dL — ABNORMAL LOW (ref 12.0–15.0)
MCH: 32.4 pg (ref 26.0–34.0)
MCH: 32.7 pg (ref 26.0–34.0)
MCH: 32.8 pg (ref 26.0–34.0)
MCH: 32.8 pg (ref 26.0–34.0)
MCH: 32.8 pg (ref 26.0–34.0)
MCH: 33 pg (ref 26.0–34.0)
MCH: 33 pg (ref 26.0–34.0)
MCH: 33.1 pg (ref 26.0–34.0)
MCH: 33.4 pg (ref 26.0–34.0)
MCHC: 33.4 g/dL (ref 30.0–36.0)
MCHC: 33.6 g/dL (ref 30.0–36.0)
MCHC: 33.9 g/dL (ref 30.0–36.0)
MCHC: 34 g/dL (ref 30.0–36.0)
MCHC: 34 g/dL (ref 30.0–36.0)
MCHC: 34.1 g/dL (ref 30.0–36.0)
MCHC: 34.2 g/dL (ref 30.0–36.0)
MCHC: 34.5 g/dL (ref 30.0–36.0)
MCHC: 34.5 g/dL (ref 30.0–36.0)
MCV: 95.5 fL (ref 78.0–100.0)
MCV: 96.4 fL (ref 78.0–100.0)
MCV: 96.8 fL (ref 78.0–100.0)
MCV: 96.8 fL (ref 78.0–100.0)
MCV: 97.2 fL (ref 78.0–100.0)
MCV: 97.5 fL (ref 78.0–100.0)
Platelets: 221 K/uL (ref 150–400)
Platelets: 243 10*3/uL (ref 150–400)
Platelets: 247 10*3/uL (ref 150–400)
Platelets: 280 10*3/uL (ref 150–400)
Platelets: 300 10*3/uL (ref 150–400)
RBC: 2.84 MIL/uL — ABNORMAL LOW (ref 3.87–5.11)
RBC: 3.58 MIL/uL — ABNORMAL LOW (ref 3.87–5.11)
RBC: 3.73 MIL/uL — ABNORMAL LOW (ref 3.87–5.11)
RBC: 3.92 MIL/uL (ref 3.87–5.11)
RDW: 13.3 % (ref 11.5–15.5)
RDW: 13.7 % (ref 11.5–15.5)
RDW: 13.7 % (ref 11.5–15.5)
RDW: 13.7 % (ref 11.5–15.5)
RDW: 13.8 % (ref 11.5–15.5)
RDW: 13.8 % (ref 11.5–15.5)
RDW: 14 % (ref 11.5–15.5)
RDW: 14 % (ref 11.5–15.5)
WBC: 14.4 10*3/uL — ABNORMAL HIGH (ref 4.0–10.5)
WBC: 15.9 10*3/uL — ABNORMAL HIGH (ref 4.0–10.5)
WBC: 16.7 K/uL — ABNORMAL HIGH (ref 4.0–10.5)
WBC: 18.3 10*3/uL — ABNORMAL HIGH (ref 4.0–10.5)
WBC: 9.8 10*3/uL (ref 4.0–10.5)

## 2010-06-25 LAB — HEPARIN LEVEL (UNFRACTIONATED)
Heparin Unfractionated: 0.13 IU/mL — ABNORMAL LOW (ref 0.30–0.70)
Heparin Unfractionated: 0.24 IU/mL — ABNORMAL LOW (ref 0.30–0.70)
Heparin Unfractionated: 0.34 IU/mL (ref 0.30–0.70)
Heparin Unfractionated: 0.39 IU/mL (ref 0.30–0.70)
Heparin Unfractionated: 0.46 IU/mL (ref 0.30–0.70)
Heparin Unfractionated: 0.6 [IU]/mL (ref 0.30–0.70)
Heparin Unfractionated: 0.61 IU/mL (ref 0.30–0.70)

## 2010-06-25 LAB — BASIC METABOLIC PANEL
BUN: 11 mg/dL (ref 6–23)
BUN: 13 mg/dL (ref 6–23)
BUN: 15 mg/dL (ref 6–23)
BUN: 17 mg/dL (ref 6–23)
BUN: 17 mg/dL (ref 6–23)
BUN: 19 mg/dL (ref 6–23)
BUN: 33 mg/dL — ABNORMAL HIGH (ref 6–23)
BUN: 8 mg/dL (ref 6–23)
CO2: 23 mEq/L (ref 19–32)
CO2: 23 mEq/L (ref 19–32)
CO2: 23 mEq/L (ref 19–32)
CO2: 26 mEq/L (ref 19–32)
CO2: 27 mEq/L (ref 19–32)
CO2: 27 mEq/L (ref 19–32)
Calcium: 8.5 mg/dL (ref 8.4–10.5)
Calcium: 8.7 mg/dL (ref 8.4–10.5)
Calcium: 8.8 mg/dL (ref 8.4–10.5)
Calcium: 8.8 mg/dL (ref 8.4–10.5)
Calcium: 9.2 mg/dL (ref 8.4–10.5)
Calcium: 9.5 mg/dL (ref 8.4–10.5)
Calcium: 9.7 mg/dL (ref 8.4–10.5)
Chloride: 100 mEq/L (ref 96–112)
Chloride: 108 mEq/L (ref 96–112)
Chloride: 97 mEq/L (ref 96–112)
Creatinine, Ser: 0.64 mg/dL (ref 0.4–1.2)
Creatinine, Ser: 0.74 mg/dL (ref 0.4–1.2)
Creatinine, Ser: 0.76 mg/dL (ref 0.4–1.2)
Creatinine, Ser: 0.8 mg/dL (ref 0.4–1.2)
GFR calc Af Amer: >60 mL/min (ref >60)
GFR calc Af Amer: >60 mL/min (ref >60)
GFR calc Af Amer: >60 mL/min (ref >60)
GFR calc Af Amer: >60 mL/min (ref >60)
GFR calc non Af Amer: >60 mL/min (ref >60)
GFR calc non Af Amer: >60 mL/min (ref >60)
GFR calc non Af Amer: >60 mL/min (ref >60)
GFR calc non Af Amer: >60 mL/min (ref >60)
GFR calc non Af Amer: >60 mL/min (ref >60)
GFR calc non Af Amer: >60 mL/min (ref >60)
GFR calc non Af Amer: >60 mL/min (ref >60)
Glucose, Bld: 106 mg/dL — ABNORMAL HIGH (ref 70–99)
Glucose, Bld: 108 mg/dL — ABNORMAL HIGH (ref 70–99)
Glucose, Bld: 111 mg/dL — ABNORMAL HIGH (ref 70–99)
Glucose, Bld: 117 mg/dL — ABNORMAL HIGH (ref 70–99)
Glucose, Bld: 128 mg/dL — ABNORMAL HIGH (ref 70–99)
Glucose, Bld: 138 mg/dL — ABNORMAL HIGH (ref 70–99)
Glucose, Bld: 99 mg/dL (ref 70–99)
Potassium: 3.1 mEq/L — ABNORMAL LOW (ref 3.5–5.1)
Potassium: 3.8 mEq/L (ref 3.5–5.1)
Potassium: 3.9 mEq/L (ref 3.5–5.1)
Potassium: 4.1 mEq/L (ref 3.5–5.1)
Sodium: 130 mEq/L — ABNORMAL LOW (ref 135–145)
Sodium: 131 mEq/L — ABNORMAL LOW (ref 135–145)
Sodium: 136 mEq/L (ref 135–145)
Sodium: 137 mEq/L (ref 135–145)
Sodium: 139 mEq/L (ref 135–145)
Sodium: 142 mEq/L (ref 135–145)

## 2010-06-25 LAB — DIFFERENTIAL
Basophils Absolute: 0 10*3/uL (ref 0.0–0.1)
Basophils Relative: 0 % (ref 0–1)
Eosinophils Absolute: 0 10*3/uL (ref 0.0–0.7)
Eosinophils Relative: 0 % (ref 0–5)
Lymphocytes Relative: 16 % (ref 12–46)
Lymphs Abs: 1.6 K/uL (ref 0.7–4.0)
Monocytes Absolute: 0.8 K/uL (ref 0.1–1.0)
Monocytes Relative: 8 % (ref 3–12)
Neutro Abs: 7.4 K/uL (ref 1.7–7.7)
Neutrophils Relative %: 75 % (ref 43–77)

## 2010-06-25 LAB — CARDIAC PANEL(CRET KIN+CKTOT+MB+TROPI)
CK, MB: 12.7 ng/mL (ref 0.3–4.0)
Total CK: 456 U/L — ABNORMAL HIGH (ref 7–177)
Total CK: 550 U/L — ABNORMAL HIGH (ref 7–177)
Total CK: 595 U/L — ABNORMAL HIGH (ref 7–177)
Troponin I: 0.38 ng/mL — ABNORMAL HIGH (ref 0.00–0.06)

## 2010-06-25 LAB — CK TOTAL AND CKMB (NOT AT ARMC)
CK, MB: 15.7 ng/mL (ref 0.3–4.0)
CK, MB: 7.2 ng/mL (ref 0.3–4.0)
Relative Index: 3.5 — ABNORMAL HIGH (ref 0.0–2.5)
Total CK: 448 U/L — ABNORMAL HIGH (ref 7–177)

## 2010-06-25 LAB — PREALBUMIN: Prealbumin: 34.1 mg/dL (ref 18.0–45.0)

## 2010-06-25 LAB — POCT I-STAT 3, ART BLOOD GAS (G3+)
Acid-Base Excess: 4 mmol/L — ABNORMAL HIGH (ref 0.0–2.0)
Bicarbonate: 26.2 mEq/L — ABNORMAL HIGH (ref 20.0–24.0)
O2 Saturation: 96 %
TCO2: 27 mmol/L (ref 0–100)
pCO2 arterial: 32.6 mmHg — ABNORMAL LOW (ref 35.0–45.0)
pH, Arterial: 7.512 — ABNORMAL HIGH (ref 7.350–7.400)
pO2, Arterial: 74 mmHg — ABNORMAL LOW (ref 80.0–100.0)

## 2010-06-25 LAB — POCT I-STAT 3, VENOUS BLOOD GAS (G3P V)
Acid-Base Excess: 1 mmol/L (ref 0.0–2.0)
Bicarbonate: 24.7 meq/L — ABNORMAL HIGH (ref 20.0–24.0)
O2 Saturation: 61 %
TCO2: 26 mmol/L (ref 0–100)
pCO2, Ven: 36 mmHg — ABNORMAL LOW (ref 45.0–50.0)
pH, Ven: 7.445 — ABNORMAL HIGH (ref 7.250–7.300)
pO2, Ven: 30 mmHg (ref 30.0–45.0)

## 2010-06-25 LAB — GLUCOSE, CAPILLARY
Glucose-Capillary: 104 mg/dL — ABNORMAL HIGH (ref 70–99)
Glucose-Capillary: 105 mg/dL — ABNORMAL HIGH (ref 70–99)
Glucose-Capillary: 106 mg/dL — ABNORMAL HIGH (ref 70–99)
Glucose-Capillary: 113 mg/dL — ABNORMAL HIGH (ref 70–99)
Glucose-Capillary: 116 mg/dL — ABNORMAL HIGH (ref 70–99)
Glucose-Capillary: 120 mg/dL — ABNORMAL HIGH (ref 70–99)
Glucose-Capillary: 124 mg/dL — ABNORMAL HIGH (ref 70–99)
Glucose-Capillary: 133 mg/dL — ABNORMAL HIGH (ref 70–99)
Glucose-Capillary: 142 mg/dL — ABNORMAL HIGH (ref 70–99)

## 2010-06-25 LAB — MRSA PCR SCREENING: MRSA by PCR: NEGATIVE

## 2010-06-25 LAB — BASIC METABOLIC PANEL WITH GFR
Calcium: 9.1 mg/dL (ref 8.4–10.5)
Creatinine, Ser: 0.7 mg/dL (ref 0.4–1.2)
GFR calc Af Amer: >60 mL/min (ref >60)
GFR calc non Af Amer: >60 mL/min (ref >60)
Glucose, Bld: 119 mg/dL — ABNORMAL HIGH (ref 70–99)
Potassium: 3.5 meq/L (ref 3.5–5.1)
Sodium: 131 meq/L — ABNORMAL LOW (ref 135–145)

## 2010-06-25 LAB — PROTIME-INR
INR: 1.05 (ref 0.00–1.49)
INR: 1.39 (ref 0.00–1.49)
Prothrombin Time: 13.6 s (ref 11.6–15.2)
Prothrombin Time: 14.2 seconds (ref 11.6–15.2)

## 2010-06-25 LAB — URINE CULTURE: Colony Count: 50000

## 2010-06-25 LAB — POCT CARDIAC MARKERS
CKMB, poc: 3.5 ng/mL (ref 1.0–8.0)
CKMB, poc: 4.5 ng/mL (ref 1.0–8.0)
Myoglobin, poc: 400 ng/mL (ref 12–200)
Myoglobin, poc: >500 ng/mL (ref 12–200)
Troponin i, poc: 0.18 ng/mL — ABNORMAL HIGH (ref 0.00–0.09)
Troponin i, poc: <0.05 ng/mL (ref 0.00–0.09)

## 2010-06-25 LAB — PROLACTIN: Prolactin: 2.6 ng/mL

## 2010-06-25 LAB — ALBUMIN: Albumin: 3.8 g/dL (ref 3.5–5.2)

## 2010-06-25 LAB — LIPID PANEL
HDL: 67 mg/dL (ref >39)
Total CHOL/HDL Ratio: 1.9 RATIO
VLDL: 17 mg/dL (ref 0–40)

## 2010-06-25 LAB — APTT: aPTT: 28 s (ref 24–37)

## 2010-06-25 LAB — MAGNESIUM: Magnesium: 2.1 mg/dL (ref 1.5–2.5)

## 2010-06-25 LAB — TROPONIN I
Troponin I: 0.33 ng/mL — ABNORMAL HIGH (ref 0.00–0.06)
Troponin I: 1.28 ng/mL (ref 0.00–0.06)

## 2010-06-25 LAB — D-DIMER, QUANTITATIVE: D-Dimer, Quant: 0.49 ug/mL-FEU — ABNORMAL HIGH (ref 0.00–0.48)

## 2010-06-26 ENCOUNTER — Other Ambulatory Visit (INDEPENDENT_AMBULATORY_CARE_PROVIDER_SITE_OTHER): Payer: Medicare Other

## 2010-06-26 ENCOUNTER — Ambulatory Visit (INDEPENDENT_AMBULATORY_CARE_PROVIDER_SITE_OTHER): Payer: Medicare Other | Admitting: Surgery

## 2010-06-26 ENCOUNTER — Other Ambulatory Visit: Payer: Self-pay

## 2010-06-26 DIAGNOSIS — I6529 Occlusion and stenosis of unspecified carotid artery: Secondary | ICD-10-CM

## 2010-06-27 ENCOUNTER — Ambulatory Visit (HOSPITAL_COMMUNITY)
Admission: RE | Admit: 2010-06-27 | Discharge: 2010-06-27 | Disposition: A | Discharge status: Home / Self Care | Payer: Medicare Other | Source: Ambulatory Visit | Attending: Internal Medicine | Admitting: Internal Medicine

## 2010-06-27 DIAGNOSIS — IMO0001 Reserved for inherently not codable concepts without codable children: Secondary | ICD-10-CM | POA: Insufficient documentation

## 2010-06-27 DIAGNOSIS — L97309 Non-pressure chronic ulcer of unspecified ankle with unspecified severity: Secondary | ICD-10-CM | POA: Insufficient documentation

## 2010-06-27 NOTE — Assessment & Plan Note (Addendum)
OFFICE VISIT  [name], [name] DOB:  [birth year]                                       06/26/2010 ZOXWR#:[id number]  REASON FOR VISIT:  Followup.  HISTORY:  This is a 70 year old female whom I am following for carotid disease in the setting of the innominate artery occlusion.  The patient suffered a stroke in 2008 at which time she was diagnosed via angiography as having an occluded innominate artery.  I first met her in 2011 which she was found to have left carotid stenosis.  She has had a couple of episodes of syncope but no specific symptoms related to obvious carotid disease.  Specifically, she has not had any amaurosis, no numbness or weakness in the extremity.  No slurred speech.  The patient is medically managed for hypertension, hypercholesterolemia. She is on Coumadin for atrial fibrillation.  She has developed a left lateral malleolus wound which is being managed in the wound center.  PHYSICAL EXAMINATION:  Vital signs:  Heart rate is 60, blood pressure 124/69, temperature is 97.9.  General:  She is well-appearing, no distress.  HEENT:  Within normal limits.  Lungs:  Clear bilaterally. Cardiovascular:  Regular rate and rhythm.  She has a left carotid bruit. Abdomen:  Soft.  Extremities:  Warm and well-perfused.  She has a 2 x 1 defect which is approximately 1/2 cm deep on the lateral malleolus of the left leg.  She has a palpable pedal pulse.  No surrounding erythema.  DIAGNOSTIC STUDIES:  Carotid duplex was performed today which shows no significant change, 60%-79% proximal left carotid stenosis on the left. No significant stenosis on the right.  She has known right innominate occlusion.  ASSESSMENT:  Known innominate occlusion and left carotid stenosis.  PLAN:  The patient continues to be asymptomatic.  We will continue to medically manage her occlusive disease including blood pressure control, cholesterol management.  I am going to have her  follow up in 6 months with an ultrasound.  I will plan on seeing her back in a year with a repeat ultrasound assuming her 74-month study is unchanged.    Jorge Ny, MD Electronically Signed  VWB/MEDQ  D:  06/26/2010  T:  06/27/2010  Job:  [id number]  cc:   Catalina Pizza, M.D. Noralyn Pick. Eden Emms, MD, [facility]

## 2010-06-30 ENCOUNTER — Inpatient Hospital Stay (HOSPITAL_COMMUNITY)
Admission: RE | Admit: 2010-06-30 | Discharge: 2010-06-30 | Disposition: A | Discharge status: Home / Self Care | Payer: Medicare Other | Source: Ambulatory Visit | Attending: Physical Therapy | Admitting: Physical Therapy

## 2010-07-03 ENCOUNTER — Ambulatory Visit (HOSPITAL_COMMUNITY)
Admission: RE | Admit: 2010-07-03 | Discharge: 2010-07-03 | Disposition: A | Discharge status: Home / Self Care | Payer: Medicare Other | Source: Ambulatory Visit | Attending: Internal Medicine | Admitting: Internal Medicine

## 2010-07-05 ENCOUNTER — Encounter: Payer: Self-pay | Admitting: Cardiovascular Disease

## 2010-07-05 DIAGNOSIS — I4891 Unspecified atrial fibrillation: Secondary | ICD-10-CM

## 2010-07-05 DIAGNOSIS — Z7901 Long term (current) use of anticoagulants: Secondary | ICD-10-CM | POA: Insufficient documentation

## 2010-07-06 ENCOUNTER — Ambulatory Visit (HOSPITAL_COMMUNITY)
Admission: RE | Admit: 2010-07-06 | Discharge: 2010-07-06 | Disposition: A | Discharge status: Home / Self Care | Payer: Medicare Other | Source: Ambulatory Visit | Attending: Internal Medicine | Admitting: Internal Medicine

## 2010-07-06 DIAGNOSIS — L97309 Non-pressure chronic ulcer of unspecified ankle with unspecified severity: Secondary | ICD-10-CM | POA: Insufficient documentation

## 2010-07-06 DIAGNOSIS — IMO0001 Reserved for inherently not codable concepts without codable children: Secondary | ICD-10-CM | POA: Insufficient documentation

## 2010-07-10 ENCOUNTER — Ambulatory Visit (HOSPITAL_COMMUNITY)
Admission: RE | Admit: 2010-07-10 | Discharge: 2010-07-10 | Disposition: A | Discharge status: Home / Self Care | Payer: Medicare Other | Source: Ambulatory Visit | Attending: Internal Medicine | Admitting: Internal Medicine

## 2010-07-10 DIAGNOSIS — IMO0001 Reserved for inherently not codable concepts without codable children: Secondary | ICD-10-CM | POA: Insufficient documentation

## 2010-07-10 DIAGNOSIS — L97309 Non-pressure chronic ulcer of unspecified ankle with unspecified severity: Secondary | ICD-10-CM | POA: Insufficient documentation

## 2010-07-12 ENCOUNTER — Ambulatory Visit (INDEPENDENT_AMBULATORY_CARE_PROVIDER_SITE_OTHER): Payer: Medicare Other | Admitting: *Deleted

## 2010-07-12 DIAGNOSIS — I4891 Unspecified atrial fibrillation: Secondary | ICD-10-CM

## 2010-07-12 DIAGNOSIS — Z7901 Long term (current) use of anticoagulants: Secondary | ICD-10-CM

## 2010-07-12 LAB — POCT INR: INR: 3.3

## 2010-07-13 ENCOUNTER — Ambulatory Visit (HOSPITAL_COMMUNITY)
Admission: RE | Admit: 2010-07-13 | Discharge: 2010-07-13 | Disposition: A | Discharge status: Home / Self Care | Payer: Medicare Other | Source: Ambulatory Visit | Attending: Internal Medicine | Admitting: Internal Medicine

## 2010-07-13 DIAGNOSIS — IMO0001 Reserved for inherently not codable concepts without codable children: Secondary | ICD-10-CM | POA: Insufficient documentation

## 2010-07-13 DIAGNOSIS — L97309 Non-pressure chronic ulcer of unspecified ankle with unspecified severity: Secondary | ICD-10-CM | POA: Insufficient documentation

## 2010-07-17 ENCOUNTER — Ambulatory Visit (HOSPITAL_COMMUNITY)
Admission: RE | Admit: 2010-07-17 | Discharge: 2010-07-17 | Disposition: A | Discharge status: Home / Self Care | Payer: Medicare Other | Source: Ambulatory Visit | Attending: Internal Medicine | Admitting: Internal Medicine

## 2010-07-20 ENCOUNTER — Ambulatory Visit (HOSPITAL_COMMUNITY)
Admission: RE | Admit: 2010-07-20 | Discharge: 2010-07-20 | Disposition: A | Discharge status: Home / Self Care | Payer: Medicare Other | Source: Ambulatory Visit | Attending: Internal Medicine | Admitting: Internal Medicine

## 2010-07-22 ENCOUNTER — Inpatient Hospital Stay (HOSPITAL_COMMUNITY)
Admission: EM | Admit: 2010-07-22 | Discharge: 2010-07-28 | DRG: 602 | Disposition: A | Discharge status: Home / Self Care | Payer: Medicare Other | Attending: Internal Medicine | Admitting: Internal Medicine

## 2010-07-22 ENCOUNTER — Emergency Department (HOSPITAL_COMMUNITY): Payer: Medicare Other

## 2010-07-22 DIAGNOSIS — Z7901 Long term (current) use of anticoagulants: Secondary | ICD-10-CM

## 2010-07-22 DIAGNOSIS — J449 Chronic obstructive pulmonary disease, unspecified: Secondary | ICD-10-CM | POA: Diagnosis present

## 2010-07-22 DIAGNOSIS — Z853 Personal history of malignant neoplasm of breast: Secondary | ICD-10-CM | POA: Diagnosis present

## 2010-07-22 DIAGNOSIS — I4891 Unspecified atrial fibrillation: Secondary | ICD-10-CM | POA: Diagnosis present

## 2010-07-22 DIAGNOSIS — Z8614 Personal history of Methicillin resistant Staphylococcus aureus infection: Secondary | ICD-10-CM

## 2010-07-22 DIAGNOSIS — L02419 Cutaneous abscess of limb, unspecified: Principal | ICD-10-CM | POA: Diagnosis present

## 2010-07-22 DIAGNOSIS — J189 Pneumonia, unspecified organism: Secondary | ICD-10-CM | POA: Diagnosis not present

## 2010-07-22 DIAGNOSIS — J4489 Other specified chronic obstructive pulmonary disease: Secondary | ICD-10-CM | POA: Diagnosis present

## 2010-07-22 DIAGNOSIS — E8779 Other fluid overload: Secondary | ICD-10-CM | POA: Diagnosis not present

## 2010-07-22 DIAGNOSIS — Z8673 Personal history of transient ischemic attack (TIA), and cerebral infarction without residual deficits: Secondary | ICD-10-CM | POA: Diagnosis present

## 2010-07-22 DIAGNOSIS — I10 Essential (primary) hypertension: Secondary | ICD-10-CM | POA: Diagnosis present

## 2010-07-22 LAB — DIFFERENTIAL
Basophils Absolute: 0 10*3/uL (ref 0.0–0.1)
Eosinophils Relative: 0 % (ref 0–5)
Lymphocytes Relative: 6 % — ABNORMAL LOW (ref 12–46)
Neutro Abs: 17.5 10*3/uL — ABNORMAL HIGH (ref 1.7–7.7)

## 2010-07-22 LAB — CBC
HCT: 30.2 % — ABNORMAL LOW (ref 36.0–46.0)
Hemoglobin: 10 g/dL — ABNORMAL LOW (ref 12.0–15.0)
RDW: 15.3 % (ref 11.5–15.5)
WBC: 19.9 10*3/uL — ABNORMAL HIGH (ref 4.0–10.5)

## 2010-07-22 LAB — URINALYSIS, ROUTINE W REFLEX MICROSCOPIC
Nitrite: NEGATIVE
Specific Gravity, Urine: >1.03 — ABNORMAL HIGH (ref 1.005–1.030)
pH: 5.5 (ref 5.0–8.0)

## 2010-07-22 LAB — BASIC METABOLIC PANEL
Chloride: 103 mEq/L (ref 96–112)
Creatinine, Ser: 0.87 mg/dL (ref 0.4–1.2)
GFR calc Af Amer: >60 mL/min (ref >60)

## 2010-07-22 LAB — PROTIME-INR: INR: 1.82 — ABNORMAL HIGH (ref 0.00–1.49)

## 2010-07-22 LAB — LACTIC ACID, PLASMA: Lactic Acid, Venous: 2.7 mmol/L — ABNORMAL HIGH (ref 0.5–2.2)

## 2010-07-23 LAB — CBC
Hemoglobin: 8.1 g/dL — ABNORMAL LOW (ref 12.0–15.0)
Platelets: 252 10*3/uL (ref 150–400)
RBC: 2.71 MIL/uL — ABNORMAL LOW (ref 3.87–5.11)

## 2010-07-23 LAB — COMPREHENSIVE METABOLIC PANEL
ALT: 128 U/L — ABNORMAL HIGH (ref 0–35)
AST: 156 U/L — ABNORMAL HIGH (ref 0–37)
Alkaline Phosphatase: 73 U/L (ref 39–117)
CO2: 20 mEq/L (ref 19–32)
Chloride: 108 mEq/L (ref 96–112)
GFR calc Af Amer: >60 mL/min (ref >60)
GFR calc non Af Amer: 56 mL/min — ABNORMAL LOW (ref >60)
Potassium: 3.9 mEq/L (ref 3.5–5.1)
Sodium: 136 mEq/L (ref 135–145)
Total Bilirubin: 0.8 mg/dL (ref 0.3–1.2)

## 2010-07-23 LAB — DIFFERENTIAL
Basophils Absolute: 0 10*3/uL (ref 0.0–0.1)
Lymphocytes Relative: 6 % — ABNORMAL LOW (ref 12–46)
Monocytes Relative: 4 % (ref 3–12)
Neutrophils Relative %: 90 % — ABNORMAL HIGH (ref 43–77)
WBC Morphology: INCREASED

## 2010-07-23 LAB — PROTIME-INR
INR: 2.09 — ABNORMAL HIGH (ref 0.00–1.49)
Prothrombin Time: 23.6 seconds — ABNORMAL HIGH (ref 11.6–15.2)

## 2010-07-24 ENCOUNTER — Inpatient Hospital Stay (HOSPITAL_COMMUNITY): Payer: Medicare Other

## 2010-07-24 ENCOUNTER — Encounter (HOSPITAL_COMMUNITY): Payer: Self-pay

## 2010-07-24 ENCOUNTER — Ambulatory Visit (HOSPITAL_COMMUNITY): Payer: Medicare Other | Admitting: Physical Therapy

## 2010-07-24 ENCOUNTER — Other Ambulatory Visit (HOSPITAL_COMMUNITY): Payer: Medicare Other

## 2010-07-24 LAB — BLOOD GAS, ARTERIAL
Acid-base deficit: 5.4 mmol/L — ABNORMAL HIGH (ref 0.0–2.0)
Drawn by: 23534
O2 Content: 3.5 L/min
O2 Saturation: 90.4 %
TCO2: 16.8 mmol/L (ref 0–100)
pCO2 arterial: 27 mmHg — ABNORMAL LOW (ref 35.0–45.0)
pO2, Arterial: 58.2 mmHg — ABNORMAL LOW (ref 80.0–100.0)

## 2010-07-24 LAB — CBC
Hemoglobin: 8.3 g/dL — ABNORMAL LOW (ref 12.0–15.0)
MCH: 30 pg (ref 26.0–34.0)
MCHC: 33.3 g/dL (ref 30.0–36.0)
MCV: 89.9 fL (ref 78.0–100.0)
Platelets: 294 10*3/uL (ref 150–400)

## 2010-07-24 LAB — DIFFERENTIAL
Basophils Absolute: 0.1 10*3/uL (ref 0.0–0.1)
Basophils Relative: 0 % (ref 0–1)
Eosinophils Relative: 1 % (ref 0–5)
Monocytes Absolute: 1.2 10*3/uL — ABNORMAL HIGH (ref 0.1–1.0)
Monocytes Relative: 4 % (ref 3–12)

## 2010-07-24 LAB — MRSA PCR SCREENING: MRSA by PCR: NEGATIVE

## 2010-07-24 LAB — BASIC METABOLIC PANEL
CO2: 19 mEq/L (ref 19–32)
Calcium: 7.5 mg/dL — ABNORMAL LOW (ref 8.4–10.5)
Chloride: 111 mEq/L (ref 96–112)
Creatinine, Ser: 0.82 mg/dL (ref 0.4–1.2)
Glucose, Bld: 89 mg/dL (ref 70–99)

## 2010-07-24 LAB — VANCOMYCIN, TROUGH: Vancomycin Tr: 10 ug/mL (ref 10.0–20.0)

## 2010-07-24 MED ORDER — TECHNETIUM TO 99M ALBUMIN AGGREGATED
6.0000 | Freq: Once | INTRAVENOUS | Status: AC | PRN
  Administered 2010-07-24: 5.1 via INTRAVENOUS

## 2010-07-24 MED ORDER — XENON XE 133 GAS
10.0000 | GAS_FOR_INHALATION | Freq: Once | RESPIRATORY_TRACT | Status: AC | PRN
  Administered 2010-07-24: 10.39 via RESPIRATORY_TRACT

## 2010-07-25 ENCOUNTER — Inpatient Hospital Stay (HOSPITAL_COMMUNITY): Payer: Medicare Other

## 2010-07-25 LAB — CBC
Hemoglobin: 8.5 g/dL — ABNORMAL LOW (ref 12.0–15.0)
MCH: 29.5 pg (ref 26.0–34.0)
MCHC: 32.9 g/dL (ref 30.0–36.0)
RDW: 16.1 % — ABNORMAL HIGH (ref 11.5–15.5)

## 2010-07-25 LAB — COMPREHENSIVE METABOLIC PANEL
ALT: 134 U/L — ABNORMAL HIGH (ref 0–35)
AST: 55 U/L — ABNORMAL HIGH (ref 0–37)
Albumin: 2 g/dL — ABNORMAL LOW (ref 3.5–5.2)
Alkaline Phosphatase: 121 U/L — ABNORMAL HIGH (ref 39–117)
BUN: 14 mg/dL (ref 6–23)
Chloride: 109 mEq/L (ref 96–112)
GFR calc Af Amer: >60 mL/min (ref >60)
Potassium: 3.7 mEq/L (ref 3.5–5.1)
Sodium: 138 mEq/L (ref 135–145)
Total Bilirubin: 0.3 mg/dL (ref 0.3–1.2)
Total Protein: 5.9 g/dL — ABNORMAL LOW (ref 6.0–8.3)

## 2010-07-25 LAB — DIFFERENTIAL
Basophils Absolute: 0 10*3/uL (ref 0.0–0.1)
Basophils Relative: 0 % (ref 0–1)
Eosinophils Absolute: 0 10*3/uL (ref 0.0–0.7)
Eosinophils Relative: 0 % (ref 0–5)
Monocytes Absolute: 0.6 10*3/uL (ref 0.1–1.0)
Monocytes Relative: 2 % — ABNORMAL LOW (ref 3–12)
Neutro Abs: 24.1 10*3/uL — ABNORMAL HIGH (ref 1.7–7.7)

## 2010-07-25 LAB — URINE CULTURE: Culture: NO GROWTH

## 2010-07-25 MED ORDER — GADOBENATE DIMEGLUMINE 529 MG/ML IV SOLN: 10.0000 mL | Freq: Once | INTRAVENOUS | Status: AC | PRN

## 2010-07-26 ENCOUNTER — Inpatient Hospital Stay (HOSPITAL_COMMUNITY): Payer: Medicare Other

## 2010-07-26 DIAGNOSIS — I059 Rheumatic mitral valve disease, unspecified: Secondary | ICD-10-CM

## 2010-07-26 LAB — DIFFERENTIAL
Eosinophils Absolute: 0 10*3/uL (ref 0.0–0.7)
Eosinophils Relative: 0 % (ref 0–5)
Lymphs Abs: 2 10*3/uL (ref 0.7–4.0)
Monocytes Absolute: 1.1 10*3/uL — ABNORMAL HIGH (ref 0.1–1.0)
Monocytes Relative: 3 % (ref 3–12)
Neutrophils Relative %: 92 % — ABNORMAL HIGH (ref 43–77)

## 2010-07-26 LAB — COMPREHENSIVE METABOLIC PANEL
ALT: 99 U/L — ABNORMAL HIGH (ref 0–35)
AST: 34 U/L (ref 0–37)
Albumin: 1.9 g/dL — ABNORMAL LOW (ref 3.5–5.2)
Calcium: 8 mg/dL — ABNORMAL LOW (ref 8.4–10.5)
GFR calc Af Amer: >60 mL/min (ref >60)
Sodium: 139 mEq/L (ref 135–145)
Total Protein: 5.8 g/dL — ABNORMAL LOW (ref 6.0–8.3)

## 2010-07-26 LAB — CBC
MCH: 29.6 pg (ref 26.0–34.0)
MCV: 88 fL (ref 78.0–100.0)
Platelets: 338 10*3/uL (ref 150–400)
RBC: 2.74 MIL/uL — ABNORMAL LOW (ref 3.87–5.11)

## 2010-07-26 LAB — PROTIME-INR: INR: 3.88 — ABNORMAL HIGH (ref 0.00–1.49)

## 2010-07-27 ENCOUNTER — Ambulatory Visit (HOSPITAL_COMMUNITY): Payer: Medicare Other | Admitting: Physical Therapy

## 2010-07-27 LAB — CBC
HCT: 24.6 % — ABNORMAL LOW (ref 36.0–46.0)
MCHC: 33.3 g/dL (ref 30.0–36.0)
MCV: 88.5 fL (ref 78.0–100.0)
RDW: 15.9 % — ABNORMAL HIGH (ref 11.5–15.5)

## 2010-07-27 LAB — CULTURE, BLOOD (ROUTINE X 2)

## 2010-07-27 LAB — DIFFERENTIAL
Basophils Absolute: 0.1 10*3/uL (ref 0.0–0.1)
Eosinophils Relative: 0 % (ref 0–5)
Lymphocytes Relative: 14 % (ref 12–46)
Monocytes Absolute: 1.6 10*3/uL — ABNORMAL HIGH (ref 0.1–1.0)

## 2010-07-27 LAB — BASIC METABOLIC PANEL
BUN: 16 mg/dL (ref 6–23)
Calcium: 8 mg/dL — ABNORMAL LOW (ref 8.4–10.5)
Creatinine, Ser: 0.8 mg/dL (ref 0.4–1.2)
GFR calc non Af Amer: >60 mL/min (ref >60)
Glucose, Bld: 73 mg/dL (ref 70–99)

## 2010-07-28 ENCOUNTER — Inpatient Hospital Stay (HOSPITAL_COMMUNITY): Payer: Medicare Other

## 2010-07-28 LAB — DIFFERENTIAL
Eosinophils Absolute: 0.3 10*3/uL (ref 0.0–0.7)
Eosinophils Relative: 2 % (ref 0–5)
Lymphs Abs: 4.2 10*3/uL — ABNORMAL HIGH (ref 0.7–4.0)
Monocytes Relative: 5 % (ref 3–12)

## 2010-07-28 LAB — CBC
MCH: 29.4 pg (ref 26.0–34.0)
MCV: 88.9 fL (ref 78.0–100.0)
Platelets: 462 10*3/uL — ABNORMAL HIGH (ref 150–400)
RDW: 16.1 % — ABNORMAL HIGH (ref 11.5–15.5)

## 2010-07-28 LAB — BASIC METABOLIC PANEL
CO2: 30 mEq/L (ref 19–32)
Calcium: 9 mg/dL (ref 8.4–10.5)
Chloride: 101 mEq/L (ref 96–112)
Creatinine, Ser: 0.77 mg/dL (ref 0.4–1.2)
Glucose, Bld: 70 mg/dL (ref 70–99)
Sodium: 138 mEq/L (ref 135–145)

## 2010-07-28 NOTE — Group Therapy Note (Signed)
  NAME:  [name], [name] NO.:  [id number]  MEDICAL RECORD NO.:  [id number]          PATIENT TYPE:  LOCATION:                                 FACILITY:  PHYSICIAN:  Wilson Singer, M.D.DATE OF BIRTH:  May 15, 1940  DATE OF PROCEDURE:  07/27/2010 DATE OF DISCHARGE:                                PROGRESS NOTE   HISTORY OF PRESENT ILLNESS:  This lady continues to improve empirically and she feels like she does not feel anywhere near short of breath as she had in the last couple of days.  She has been undergoing pretty significant diuresis.  She did have an echocardiogram done yesterday which showed a normal ejection fraction of 55-60% and interestingly there was no evidence of diastolic dysfunction.  It appears that we are really dealing with the pneumonia rub and any heart failure.  Her left leg also seems to be improving and wound care specialist have seen her and also confirmed this.  PHYSICAL EXAMINATION:  VITAL SIGNS:  On physical examination today, temperature 97.5, blood pressure 145/76, pulse 69, saturation 93% on 4 liters of oxygen. He does not look dyspneic at rest.  There is no increased work of breathing, so there is no peripheral or central cyanosis. CARDIOVASCULAR:  Heart sounds are present and normal. LUNGS:  Lung fields show some crackles in the right mid and possibly right lower zone.  There is no bronchial breathing.  LABORATORY DATA:  Investigations day showed an INR of 3.29, sodium of 141, potassium 2.4, bicarbonate 27, BUN 16, creatinine 0.8, hemoglobin 8.2, white blood cell coming down to 26.4 from a high yesterday of 38.2, and platelets 394.  IMPRESSION: 1. Left lower leg cellulitis. 2. Right-sided pneumonia. 3. Atrial fibrillation, currently clinically in sinus rhythm, on     Coumadin.  PLAN: 1. Discontinue IV Lasix.  I do not see any echocardiographic evidence     of heart failure. 2. Replete potassium. 3. Repeat chest x-ray  tomorrow. 4. Try to reduce inspired oxygen. 5. Possibly discharged home soon.     Wilson Singer, M.D.     NCG/MEDQ  D:  07/27/2010  T:  07/27/2010  Job:  [id number]  Electronically Signed by Lilly Cove M.D. on 07/28/2010 01:12:58 PM

## 2010-07-29 NOTE — Discharge Summary (Signed)
[name], [name]                  ACCOUNT NO.:  [id number]  MEDICAL RECORD NO.:  [id number]           PATIENT TYPE:  I  LOCATION:  A328                          FACILITY:  [facility]  PHYSICIAN:  Wilson Singer, M.D.DATE OF BIRTH:  [birth year]  DATE OF ADMISSION:  07/22/2010 DATE OF DISCHARGE:  04/20/2012LH                              DISCHARGE SUMMARY   PRIMARY CARE PHYSICIAN:  Catalina Pizza, MD.  FINAL DISCHARGE DIAGNOSES: 1. Cellulitis of the left lower leg. 2. Right-sided pneumonia. 3. Chronic obstructive pulmonary disease. 4. Atrial fibrillation, currently in sinus rhythm, on Coumadin. 5. Possible fluid overload, normal echocardiogram.  CONDITION ON DISCHARGE:  Stable.  MEDICATIONS ON DISCHARGE:  She will continue home medications, which include: 1. Exforge one tablet daily. 2. Pravachol 40 mg daily. 3. Hydrocodone/APAP 5/325 one tablet every 6 hours p.r.n. 4. Meloxicam 15 mg daily. 5. Welchol 625 mg 3 tablets twice daily. 6. Warfarin dose dependent on INR and on her schedule. 7. Vitamin C 500 mg daily. 8. Omega-3 1 gram daily. 9. Plavix 75 mg daily. 10.Os-Cal with vitamin D one tablet b.i.d. 11.Multivitamins one tablet daily. 12.Metoprolol 75 mg b.i.d. 13.Aspirin 81 mg daily. 14.Amiodarone 200 mg b.i.d. 15.Fosamax 70 mg once a week. 16.Levaquin 500 mg daily for 1 week.  HISTORY:  This is a very pleasant 70-hour lady, who was admitted just approximately a week ago with weakness, shaking, and chills, and was found to have left leg cellulitis.  Please see initial history and physical examination done by Dr. Burtis Junes.  HOSPITAL PROGRESS:  The patient was treated appropriately with intravenous antibiotics.  An MRI scan of the left lower leg was done and this showed open wound of the lateral aspect of the upper left ankle with surrounding cellulitis involving the entire lower extremity, but there was no findings to suggest myofascitis, focal soft tissue  abscess, osteomyelitis, or septic arthritis.  During the hospitalization, she desaturated and required an increased in supplemental oxygen.  A chest x- ray showed that she had a right-sided pneumonia.  There was question of whether she in fact had congestive heart failure, but clinically she did not appear to have this, and an echocardiogram indeed showed that she had normal ejection fraction of 555-60% and there was no indication of diastolic dysfunction in fact the left ventricular diastolic function parameters were within normal limits entirely.  She was initially empirically treated with Lasix and believe that she might have heart failure, but clearly this was not the case.  However, she now had some fluid overload from IV fluids because after Lasix had been given she did improve and her chest x-ray also improved.  In fact today, she feels very well.  She is not dyspneic at all.  PHYSICAL EXAMINATION:  VITAL SIGNS:  Temperature is 98, blood pressure 116/70, pulse 60 and appears to be in sinus rhythm now, saturating 95% on 2 liters of oxygen. HEART/LUNGS:  Heart sounds are present with some crackles at the right and left base.  There is no bronchial breathing.  The lung fields are much improved compared to when I had seen a  few days ago. ABDOMEN:  Soft and nontender. NEUROLOGIC:  She is alert and oriented today without any focal allergic signs.  INVESTIGATIONS:  Today show sodium of 138, potassium 3.5, bicarbonate 30, BUN 15, creatinine 0.77, INR is 2.68 and in the therapeutic range. Hemoglobin 8.7, white blood cell count is 20.2 and this is coming down now from a peak of 38.2 just 2 days ago, I suspect this will trend down even further, platelets of 462.  DISPOSITION:  I think she is now stable to be discharged home and I will give her another 1-week course of antibiotics in the way of Levaquin 500 mg daily.  She is [facility] early next week and I have  asked her to follow up with her primary care physician in the next week or so also.     Wilson Singer, M.D.     NCG/MEDQ  D:  07/28/2010  T:  07/28/2010  Job:  [id number]  cc:   Catalina Pizza, M.D. Fax: [phone]  Electronically Signed by Lilly Cove M.D. on 07/29/2010 12:48:50 PM

## 2010-07-31 ENCOUNTER — Ambulatory Visit (HOSPITAL_COMMUNITY): Payer: Medicare Other | Admitting: Physical Therapy

## 2010-07-31 ENCOUNTER — Ambulatory Visit (HOSPITAL_COMMUNITY)
Admission: RE | Admit: 2010-07-31 | Discharge: 2010-07-31 | Disposition: A | Discharge status: Home / Self Care | Payer: Medicare Other | Source: Ambulatory Visit | Attending: Internal Medicine | Admitting: Internal Medicine

## 2010-07-31 DIAGNOSIS — L97309 Non-pressure chronic ulcer of unspecified ankle with unspecified severity: Secondary | ICD-10-CM | POA: Insufficient documentation

## 2010-07-31 DIAGNOSIS — IMO0001 Reserved for inherently not codable concepts without codable children: Secondary | ICD-10-CM | POA: Insufficient documentation

## 2010-08-01 ENCOUNTER — Ambulatory Visit (HOSPITAL_COMMUNITY): Payer: Medicare Other | Admitting: Physical Therapy

## 2010-08-02 ENCOUNTER — Ambulatory Visit (HOSPITAL_COMMUNITY)
Admission: RE | Admit: 2010-08-02 | Discharge: 2010-08-02 | Disposition: A | Discharge status: Home / Self Care | Payer: Medicare Other | Source: Ambulatory Visit | Attending: Internal Medicine | Admitting: Internal Medicine

## 2010-08-04 ENCOUNTER — Ambulatory Visit (HOSPITAL_COMMUNITY)
Admission: RE | Admit: 2010-08-04 | Discharge: 2010-08-04 | Disposition: A | Discharge status: Home / Self Care | Payer: Medicare Other | Source: Ambulatory Visit | Attending: Internal Medicine | Admitting: Internal Medicine

## 2010-08-04 ENCOUNTER — Ambulatory Visit (HOSPITAL_COMMUNITY): Payer: Medicare Other | Admitting: Physical Therapy

## 2010-08-07 ENCOUNTER — Ambulatory Visit (HOSPITAL_COMMUNITY)
Admission: RE | Admit: 2010-08-07 | Discharge: 2010-08-07 | Disposition: A | Discharge status: Home / Self Care | Payer: Medicare Other | Source: Ambulatory Visit | Attending: Internal Medicine | Admitting: Internal Medicine

## 2010-08-07 DIAGNOSIS — IMO0001 Reserved for inherently not codable concepts without codable children: Secondary | ICD-10-CM | POA: Insufficient documentation

## 2010-08-07 DIAGNOSIS — L97309 Non-pressure chronic ulcer of unspecified ankle with unspecified severity: Secondary | ICD-10-CM | POA: Insufficient documentation

## 2010-08-09 ENCOUNTER — Ambulatory Visit (INDEPENDENT_AMBULATORY_CARE_PROVIDER_SITE_OTHER): Payer: Medicare Other | Admitting: *Deleted

## 2010-08-09 ENCOUNTER — Ambulatory Visit (HOSPITAL_COMMUNITY)
Admission: RE | Admit: 2010-08-09 | Discharge: 2010-08-09 | Disposition: A | Discharge status: Home / Self Care | Payer: Medicare Other | Source: Ambulatory Visit | Attending: Internal Medicine | Admitting: Internal Medicine

## 2010-08-09 DIAGNOSIS — IMO0001 Reserved for inherently not codable concepts without codable children: Secondary | ICD-10-CM | POA: Insufficient documentation

## 2010-08-09 DIAGNOSIS — S81809A Unspecified open wound, unspecified lower leg, initial encounter: Secondary | ICD-10-CM | POA: Insufficient documentation

## 2010-08-09 DIAGNOSIS — S81009A Unspecified open wound, unspecified knee, initial encounter: Secondary | ICD-10-CM | POA: Insufficient documentation

## 2010-08-09 DIAGNOSIS — I4891 Unspecified atrial fibrillation: Secondary | ICD-10-CM

## 2010-08-09 DIAGNOSIS — Z7901 Long term (current) use of anticoagulants: Secondary | ICD-10-CM

## 2010-08-09 NOTE — Procedures (Unsigned)
CAROTID DUPLEX EXAM  INDICATION:  Carotid disease.  HISTORY: Diabetes:  No. Cardiac:  Atrial fibrillation. Hypertension:  Yes. Smoking:  Previous. Previous Surgery:  Mastectomy. CV History:  History of stroke in 2008, currently asymptomatic. Amaurosis Fugax No, Paresthesias No, Hemiparesis No                                      RIGHT             LEFT Brachial systolic pressure: Brachial Doppler waveforms: Vertebral direction of flow:        Retrograde        Antegrade DUPLEX VELOCITIES (cm/sec) CCA peak systolic                   32                80 ECA peak systolic                   23                90 ICA peak systolic                   28                206 ICA end diastolic                                     61 PLAQUE MORPHOLOGY:                  Heterogeneous     Heterogeneous PLAQUE AMOUNT:                      Mild              Mild/moderate PLAQUE LOCATION:                    ICA / CCA         ICA / ECA  IMPRESSION: 1. Dampened right carotid  artery waveforms noted with no     hemodynamically significant stenosis.  Known occlusion of the     innominate artery with the right common carotid artery appearing to     be supplied by the right proximal subclavian artery via a     retrograde vertebral artery.  Doppler velocities suggest a high end     40%-59% stenosis of the left proximal internal carotid artery. 2. No significant change in the Doppler velocities of the internal     carotid arteries when compared to the previous exam on 12/19/2009.  ___________________________________________ V. Charlena Cross, MD  CH/MEDQ  D:  06/27/2010  T:  06/27/2010  Job:  [id number]

## 2010-08-10 NOTE — H&P (Signed)
[name], [name]                  ACCOUNT NO.:  [id number]  MEDICAL RECORD NO.:  [id number]           PATIENT TYPE:  I  LOCATION:  A328                          FACILITY:  [facility]  PHYSICIAN:  Danilo Cappiello, DO         DATE OF BIRTH:  Sep 23, 1940  DATE OF ADMISSION:  07/22/2010 DATE OF DISCHARGE:  LH                             HISTORY & PHYSICAL   CHIEF COMPLAINT:  Weakness, shaking chills.  HISTORY OF PRESENT ILLNESS:  The patient is a 70 year old female who states about a week ago she started feeling weak which is bad and having shaking chills.  She would feel like she was getting better than she get worse, but gradually she has just gotten weaker over the week to the point that she had to be held to her car this morning.  The patient has been under the care of the [facility] at [facility] for a left ankle wound that started last December.  The wound grew out MRSA in February 2012, and the patient was on vancomycin for a period of time, but then it was stopped.  PAST MEDICAL HISTORY:  Significant for atrial fibrillation rate controlled for which the patient is on anticoagulation, she is now in sinus rhythm, coronary artery disease, hypertension, osteoporosis, hyperlipidemia, history of right frontal CVA in 2008, history of breast cancer, history of left occipital meningioma, left carotid stenosis, history of tobacco abuse, scoliosis.  She has a clotted off innominate artery and the left lateral malleolus wound for which she is [facility] which grew out MRSA in February 2012.  PAST SURGICAL HISTORY:  Significant for left radical mastectomy.  MEDICATIONS AT HOME: 1. Alendronate 70 mg 1 p.o. daily. 2. Amiodarone 200 mg 1 p.o. b.i.d. 3. Ascorbic acid 500 mg 1 p.o. daily. 4. Aspirin 81 mg 1 p.o. daily. 5. Plavix 75 mg 1 p.o. daily. 6. Os-Cal 1 tablet p.o. b.i.d. 7. WelChol 1875 mg 1 p.o. b.i.d. 8. Hydrocodone/acetaminophen 5/325 one p.o. q.6 h.  p.r.n. pain. 9. Meloxicam 15 mg 1 p.o. daily. 10.Metoprolol 75 mg 1 p.o. b.i.d. 11.Multivitamin 1 p.o. daily. 12.Prevacid 40 mg 1 p.o. daily. 13.Coumadin 5 mg 1 p.o. daily. 14.Vitamin C 500 mg 1 p.o. daily. 15.Exforge 5/320 one p.o. daily.  ALLERGIES:  BETADINE and IODINE.  SOCIAL HISTORY:  The patient has roughly a 100 pack-year smoking history, but she quit 2 years ago.  Alcohol she has, but she drinks a week, no recreational drug use.  FAMILY MEDICAL HISTORY:  Mother died at age 11 of Alzheimer.  Father died at age 86 of Parkinson.  REVIEW OF SYSTEMS:  CONSTITUTIONAL:  Positive for fever, positive for chills, positive for rigors, positive for malaise.  CNS:  Positive for headache, negative for seizure, negative for specific limb weakness. ENT:  No nasal congestion, throat pain, or coryza.  CARDIOVASCULAR:  No chest pain, no palpitations or orthopnea.  RESPIRATORY:  Positive for occasional cough.  No wheeze, no shortness of breath.  GASTROINTESTINAL: No nausea, no vomiting, no constipation, no diarrhea.  GENITOURINARY: No dysuria,  no hematuria, no urinary frequency.  RENAL:  No flank pain, no swelling, no pruritus.  SKIN:  No rashes, no sores or lesions. HEMATOLOGIC:  No easy bruising, no purpura, no clots.  LYMPH:  No lymphadenopathy.  No painful nodes or no specific lymph swelling. PSYCHIATRIC:  No anxiety, no depression, no insomnia.  The patient does have a chronic wound on her left ankle as previously stated.  PHYSICAL EXAMINATION:  VITALS:  Temperature 102.8, blood pressure 105/89, pulse 79, respiratory rate 20, she is satting 100% on room air actually. GENERAL:  The patient is a thin, well kempt, well-developed elderly female who is awake, alert and oriented x3. EYES:  Pupils equal, round, and reactive to light and accommodation. Extraocular movements bilaterally.  Sclerae nonicteric, noninjected. MOUTH:  Oral mucosa is dry.  No lesions, no sores.  Pharynx clear.   No erythema, no exudate. NECK:  Negative for JVD.  Negative for thyromegaly.  Negative for lymphadenopathy. HEART:  Regular rate and rhythm 80 beats per minute without murmur, ectopy or gallops.  No lateral PMI.  No thrills. LUNGS:  Clear to auscultation bilaterally without wheezes, rales or rhonchi.  No increased work of breathing.  No tactile fremitus. ABDOMEN:  Soft, nontender and nondistended.  Positive bowel sounds.  No hepatosplenomegaly, no hernias palpated.  CARDIOVASCULAR:  Extremities are negative for cyanosis, clubbing or edema.  She has diminished dorsalis pedis and popliteal pulses bilaterally.  No carotid bruits bilaterally. NEUROLOGICAL:  Cranial nerves II through XII are grossly intact.  Motor and sensory intact.  The patient is moving all extremities.  SKIN:  The patient has an area of erythema on the distal third of her left lower extremity, but clearly proximal to the ankle that is erythematous, that is very hot to the touch.  She is warmer to the touch in this location then she is at the ankle.  The ankle wound appears clean and dry and appears to be granulating well.  It is not fluctuant.  It is not as warm as the more proximal area on her tibia and fibula.  She has no other wounds or sores or rashes.  LABORATORY DATA:  WBC is 19.9, hemoglobin 10.0, hematocrit 30.2, platelets 362, 88% neutrophils, 6% lymphs.  PT is 21.2, INR is 1.82. Sodium 135, potassium 4.8, chloride 103, CO2 23, BUN 18, creatinine 0.87, calcium 8.6, lactic acid is 2.7.  Two-view of the chest shows no acute cardiopulmonary abnormalities.  EKG demonstrates sinus rhythm with first-degree AV block, nonischemic changes.  ASSESSMENT: 1. Cellulitis of the left lower extremity.  There is concern for     osteomyelitis with the patient with an adjacent wound which appears     to be healing well and this history of MRSA without apparent care. 2. Left ankle wound, slow healing with history of positive  culture for     MRSA. 3. Atrial fibrillation.  The patient currently in sinus.  She is on     anticoagulation. 4. Carotid artery stenosis. 5. Innominate artery stenosis. 6. Hypertension. 7. Hyperlipidemia. 8. History of cerebrovascular accident 2008. 9. History of breast cancer. 10.History of tobacco abuse.  PLAN: 1. Admit to regular medical bed with contact precautions. 2. IV vancomycin. 3. We will get an MRI of the left lower extremity. 4. IV fluid resuscitation. 5. Blood cultures x2. 6. We are checking a UA, C and S. 7. Wound care.  ______________________________ Fran Lowes, DO     AS/MEDQ  D:  07/22/2010  T:  07/23/2010  Job:  [id number]  cc:   Catalina Pizza, M.D. Fax: [phone]  Electronically Signed by Fran Lowes DO on 08/10/2010 06:07:29 PM

## 2010-08-11 ENCOUNTER — Ambulatory Visit (HOSPITAL_COMMUNITY)
Admit: 2010-08-11 | Discharge: 2010-08-11 | Disposition: A | Discharge status: Home / Self Care | Payer: Medicare Other | Attending: Internal Medicine | Admitting: Internal Medicine

## 2010-08-11 LAB — BASIC METABOLIC PANEL
BUN: 21 mg/dL (ref 4–21)
Creatinine: 0.7 mg/dL (ref 0.5–1.1)
Glucose: 84 mg/dL
Potassium: 4.5 mmol/L (ref 3.4–5.3)
Sodium: 136 mmol/L — AB (ref 137–147)

## 2010-08-11 LAB — HEPATIC FUNCTION PANEL
ALT: 11 U/L (ref 7–35)
AST: 20 U/L (ref 13–35)
Bilirubin, Total: 0.3 mg/dL

## 2010-08-11 LAB — CBC AND DIFFERENTIAL: WBC: 8.4 10^3/mL

## 2010-08-14 ENCOUNTER — Ambulatory Visit (HOSPITAL_COMMUNITY)
Admit: 2010-08-14 | Discharge: 2010-08-14 | Disposition: A | Discharge status: Home / Self Care | Payer: Medicare Other | Attending: Internal Medicine | Admitting: Internal Medicine

## 2010-08-16 ENCOUNTER — Ambulatory Visit (HOSPITAL_COMMUNITY)
Admission: RE | Admit: 2010-08-16 | Discharge: 2010-08-16 | Disposition: A | Discharge status: Home / Self Care | Payer: Medicare Other | Source: Ambulatory Visit | Attending: Internal Medicine | Admitting: Internal Medicine

## 2010-08-18 ENCOUNTER — Ambulatory Visit (HOSPITAL_COMMUNITY)
Admit: 2010-08-18 | Discharge: 2010-08-18 | Disposition: A | Discharge status: Home / Self Care | Payer: Medicare Other | Attending: Internal Medicine | Admitting: Internal Medicine

## 2010-08-21 ENCOUNTER — Ambulatory Visit (HOSPITAL_COMMUNITY)
Admission: RE | Admit: 2010-08-21 | Discharge: 2010-08-21 | Disposition: A | Discharge status: Home / Self Care | Payer: Medicare Other | Source: Ambulatory Visit | Attending: Internal Medicine | Admitting: Internal Medicine

## 2010-08-22 NOTE — Discharge Summary (Signed)
[name], [name] NO.:  [id number]   MEDICAL RECORD NO.:  [id number]          PATIENT TYPE:  INP   LOCATION:  3003                         FACILITY:  [facility]   PHYSICIAN:  Annie Main, N.P.      DATE OF BIRTH:  12/30/1940   DATE OF ADMISSION:  08/11/2006  DATE OF DISCHARGE:  08/13/2006                               DISCHARGE SUMMARY   DIAGNOSES AT TIME OF DISCHARGE:  1. 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  2. Right brachiocephalic stenosis for percutaneous transluminal      coronary angioplasty/stent Friday, May 9th.  3. Dyslipidemia.  4. Hip pain.  5. Breast cancer with radical mastectomy and chemotherapy in 1989 but      no radiation.  6. Hypertension.  7. Cigarettes smoker.  8. Left lower lobe nodule, 4 to 5 mm, likely calcification that needs      followup.   MEDICATIONS AT TIME OF DISCHARGE:  1. Fosamax 70 mg a day.  2. Multivitamin a day.  3. Pravachol 40 mg q.h.s.  4. Ziac 5/6.25 mg a day.  5. Welchol 625 mg 2 tablets b.i.d.  6. Aspirin 325 mg a day.  7. Nicotine patch 21 mg daily x6 weeks and then 14 mg daily x2 weeks,      and then 7 mg daily x2 weeks, then stop.   MEDICATION ALLERGIES:  1. IODINE.  2. BETADINE.   STUDIES PERFORMED:  1. CT of the brain on admission showed no acute abnormality, mild      atrophy, and chronic type small vessel disease.  2. MRI of the brain showed acute right frontotemporal, parietal, and      inferior infarction  related to occlusion in the angular branch and      right middle cerebral artery.  Atrophy and small vessel disease.      Mild left occipital meningioma 10 x 18 x 13 mm projecting adjacent      to and just above the transverse sinus on the left.  3. MRA of the head showed occlusion of the angular branch and right      middle cerebral artery accounting for the observed findings on      diffusion.  4. MRA of the neck shows hemodynamically significant proximal  innominate stenosis with reduced caliber and signal intensity of      the carotid and subclavian systems on the right.  Estimated 75%      stenosis left subclavian that is proximal to the vertebral origin.      Bilateral vertebral artery proximal ostial stenosis at 75%.  Non      stenotic posterior wall plaque left ICA origin.  Non stenotic      plaque origin right ICA.  Other significant stenosis could be      masked due to the reduced caliber of the vessel.  5. Chest x-ray shows COPD, emphysema, possible lung nodules in the      left apex, and right infrahilar regions.  6. CT of the chest shows no pulmonary nodule corresponding to recent  chest x-ray, 4 to 5 mm nodule in left lower lobe can be followed in      6 to 12 months and looks like calcification, and then small      bilateral pleural effusions.   LABORATORY STUDIES:  CBC with hematocrit 35.1, otherwise normal.  Chemistry normal.  Coagulation study is normal.  Cardiac enzymes  negative x3.  Cholesterol 115, triglycerides 54, HDL 39, and LDL 65.  Calcium is 8.3.  Urinalysis with 3 to 6 white blood cells, 0 to 2 red  blood cells, small leukocyte esterase.  Guaiac stools negative.  Homocysteine 6.8.  Urine drug screen negative.  Hemoglobin A1c 5.7.   HISTORY OF PRESENT ILLNESS:  [name] is a 70 year old right-handed  Caucasian female who recently moved from [place] to [place] and does  not have a primary care physician.  The day prior to admission, she and  her husband were working in the yard and stopped work about 2:30 p.m.  They  cleaned up and went out to smoke a cigarette.  She was seen by her  husband some time after 4 p.m. and apparently had some difficulty with  her speech that had began since he last saw her.  She was initially  unable to speak.  The last time she was seen normal was 2:30 p.m.  She  was taken to [facility] where her speech improved, and it was  decided she really was last normal around 3  p.m.  Dr. Sandria Manly was called  regarding her aphasia at around 5:52 p.m. as she was getting ready to go  to CT.  Her examination was reported to show only an aphasia without any  other neurologic signs, and she could understand, read, and write, and  that she was having some word-finding difficulties.  It was noticed she  had improved, and because of her improvement the length of time at the  end of the three-hour window and not having had her CT scan results at  that point, it was decided she would not make the time limit for TPA.  She was admitted to [facility] and did not worsen during the night but  did not improvement, and she was transferred to [facility] for further  evaluation.  She was placed on aspirin there.  She was not taking that  prior to admission.  Risk factors for stroke include:  High blood  pressure, cigarette smoking, and dyslipidemia.  She was admitted to  [facility] for further evaluation.   HOSPITAL COURSE:  MRA of the brain showed right brachiocephalic stenosis  which is her likely etiology for her stroke.  She will need an  angioplasty and a stent of this area by radiology.  She was seen by Dr.  Corliss Skains, and he is agrees and is scheduled for procedure for Friday,  May 9.  There is no need for ongoing hospitalization until the procedure  is performed.  The patient's speech has continued to improve during the  hospitalization and is almost back to baseline.  She has been arranged  for outpatient speech therapy after discharge for refining of her  speech.  She was continued on aspirin for secondary stroke prevention  and will be discharged on this.  The patient has been advised to stop  smoking.  She was placed on a nicotine patch.  The patient states she  will attempt to stop smoking.  She has been advised to get a primary  care physician and  to see him or her within one month.  She will need followup of her pulmonary nodule that was found by routine x-ray in the   hospital.  CT of the chest in the hospital shows that it was most likely  a calcification.   CONDITION AT DISCHARGE:  The patient is awake, alert, and oriented x3.  Very mild aphasia with word-finding difficulties, mild confluent speech,  and no focal weakness.  CHEST:  Clear to auscultation.  Heart rate regular.   DISCHARGE PLAN:  1. Discharge home with husband.  2. Aspirin for stroke prevention.  3. Find a primary care physician.  4. Followup chest x-ray in four to six months to evaluate lung nodule,      most likely calcification.  5. __________  for angioplasty, stent right brachiocephalic on Friday,      May 9th, by Dr. Corliss Skains and then follow up with Dr. Delia Heady      in two to three months.      Annie Main, N.P.     SB/MEDQ  D:  08/13/2006  T:  08/13/2006  Job:  [id number]   cc:   Pramod P. Pearlean Brownie, MD

## 2010-08-22 NOTE — Consult Note (Signed)
[name], [name]                  ACCOUNT NO.:  [id number]   MEDICAL RECORD NO.:  [id number]          PATIENT TYPE:  AMB   LOCATION:  SDS                          FACILITY:  [facility]   PHYSICIAN:  Sanjeev K. Deveshwar, M.D.DATE OF BIRTH:  27-Apr-1940   DATE OF CONSULTATION:  11/26/2006  DATE OF DISCHARGE:  11/26/2006                                 CONSULTATION   REASON FOR CONSULTATION:  Cerebrovascular disease.   HISTORY OF PRESENT ILLNESS:  This is a very pleasant 70 year old female  who was admitted to [facility] on Aug 11, 2006, until  Aug 13, 2006, by Dr. Evie Lacks, with a new onset of a right frontal  CVA.  An MRI/MRA was performed on that day and was consistent with a 75%  stenosis of the left subclavian artery with a significant innominate  artery stenosis as well.  The patient was noted to have bilateral  vertebral artery stenosis of approximately 75%.   The patient's initial presenting symptoms included inability to speak.  This gradually improved to the point where she was essentially  asymptomatic.  Dr. Grandville Silos. Deveshwar was contacted during her  hospital stay, and arrangements were made to have the patient return to  [facility] on Aug 23, 2006, to undergo a cerebral  angiogram with possible intervention.  At that time Dr. Corliss Skains  attempted to address the stenosis of the innominate artery; however, he  was unable to cross the lesion.  Multiple attempts were made using  different catheters and guide wires; however, the artery was almost  completely occluded.  Arrangements were made to have the patient return  to [facility] on November 26, 2006, for another attempt.   PAST MEDICAL HISTORY:  1. Significant for hyperlipidemia.  2. The above-noted CVA.  3. Hypertension.  4. Previous history of breast cancer.  5. History of a left occipital meningioma.  6. The patient has a long history of tobacco use.  She quit at  the      time of her stroke.  7. While she was in the hospital during her initial admission she was      found to have a pulmonary nodule on chest x-ray.  The CT scan was      performed in followup.  At that time there was no pulmonary nodule      to correspond with the nodule seen on the chest x-ray; however,      follow-up CT scan was recommended in six to 12 months.  Apparently      the patient has not had that follow-up scan.  She states she was      not aware of any pulmonary nodule.   PAST SURGICAL HISTORY:  1. Significant for a left mastectomy.  2. History of an exploratory laparotomy in 1961.   ALLERGIES:  IODINE AND BETADINE.   MEDICATIONS ON ADMISSION:  1. Aspirin.  2. Plavix.  3. Pravastatin.  4. Bisoprolol/hydrochlorothiazide,  5. Welchol.  6. Nicoderm patches.  7. Fosamax.   SOCIAL HISTORY:  The patient  is married.  She and her husband have two  children.  They recently moved to [place] from [place].  She  initially did not have a primary care physician.  She is now seeing Dr.  Catalina Pizza.  She has a long history of tobacco use.  She quit at the time  of her stroke.  She does not use alcohol.  She is college-educated.  She  taught kindergarten until 1989.   FAMILY HISTORY:  Her mother died at age 21, from complications of  Alzheimer's dementia.  Her father died at age 23, from complications  from Parkinson's disease.   REVIEW OF SYSTEMS:  Is completely negative.   LABORATORY DATA:  INR 1, PTT 31.  Hemoglobin 14, hematocrit 40.8, WBCs  9,600, platelets 296,000.  BUN 13, creatinine 0.63, potassium 5, glucose  84, GFR greater than 60.   PHYSICAL EXAMINATION:  GENERAL:  A very pleasant 70 year old white  female, in no acute distress.  VITAL SIGNS:  Blood pressure 102/71, pulse 61, respirations 16,  temperature 97.8 degrees, oxygen saturation 96% on room air.  HEENT:  Unremarkable.  NECK:  Revealed a left carotid bruit.  HEART:  A regular rate and rhythm  with a grade 2/6 systolic murmur.  LUNGS:  Decreased but clear.  Her airway was rated at a 2.  Her ASA  scale was a 3.  ABDOMEN:  Nontender.  EXTREMITIES:  Pulses intact without edema.  NEUROLOGIC:  Mental status:  The patient was alert and oriented and  follows commands.  Cranial nerves II-XII  are grossly intact. Sensation  intact to light touch.  Motor strength was 5/5 throughout.  Cerebellar  testing was intact.   IMPRESSION/PLAN:  As noted, this patient was admitted to [facility] for a second attempt at opening an occluded innominate  artery.  It was felt that she might have a small amount of flow through  this vessel.  It was also hoped that while she was on aspirin and  Plavix, the area may have recannulized.   The patient does have a history of CONTRAST ALLERGY.  She was  premedicated with prednisone, Benadryl and Pepcid.  Dr. Corliss Skains did  attempt once again to open the innominate artery; however, unfortunately  again this was unsuccessful.  Please see his dictated noted for complete  details.  Arrangements were made to discharge the patient home later  that day.   As noted, the patient had an abnormal chest x-ray during her initial  admission for a CVA.  She had a follow-up CT scan; however, a subsequent  follow-up CT scan was recommended in six to 12 months.  She is now due  for that  scan.  We thought if she was admitted at this time, that the scan could  be performed at [facility]; however, as noted, she did  not require admission.  She is due to see her primary care physician,  Dr. Catalina Pizza, in the near future.  Perhaps he can schedule the follow-  up CT scan.      Delton See, P.A.    ______________________________  Grandville Silos. Corliss Skains, M.D.    DR/MEDQ  D:  11/27/2006  T:  11/27/2006  Job:  [id number]   cc:   Catalina Pizza, M.D.  Evie Lacks, MD

## 2010-08-22 NOTE — H&P (Signed)
NAMEELIZAVETA, MATTICE NO.:  [id number]   MEDICAL RECORD NO.:  [id number]          PATIENT TYPE:  INP   LOCATION:  3003                         FACILITY:  [facility]   PHYSICIAN:  Genene Churn. Love, M.D.    DATE OF BIRTH:  01/20/1941   DATE OF ADMISSION:  08/11/2006  DATE OF DISCHARGE:                              HISTORY & PHYSICAL   This is the first [facility] admission for this 70 year old  right-handed white married [place]  transferred to [facility] to evaluate aphasia, stroke and low  blood pressure and to obtain MRI study of brain.   HISTORY OF PRESENT ILLNESS:  Ms. Sabo has moved recently from [place]  to [place], [place] and does not have a primary care  physician.  Yesterday, she and her husband were working in the yard and  stopped work about 2:30 p.m.  They cleaned up and she went out to smoke  a cigarette.  She was seen by her husband sometime after 4:00 and  apparently had difficulty sometime between 3:00 and 3:30 she states with  her cigarette.  She initially was unable to speak when seen by her  husband at 4:20 p.m.  The last time he saw that she was normal was 2:30  p.m.  She was taken to [facility] where her speech improved and  it was decided she was last normal at about 3:00.  I was called  regarding her aphasia about 5:52 p.m. and she was getting ready to go  for CT scan.  Her examination was reported to show only an aphasia  without any other neurologic signs and that she could understand, read  and write and that she was having word-finding difficulties.  It was  noted that she had improved and because of her improvement, the length  of time being at the end of the 3-hour window, and not having her CT  scan results at that point, it was decided she would not make the time  limit for TPA and it was not given.  She did not worsen during the night  but did not improve and she is  transferred today for further evaluation.  She was given aspirin which she was not taking before her admission to  [facility].  Risk factors for stroke include high blood  pressure, cigarette smoking and hyperlipidemia.   PAST MEDICAL HISTORY:  Her past medical history is significant hip pain,  breast cancer with left radical mastectomy and chemotherapy in 1989 but  no radiation, hypertension, elevated cholesterol.  SHE HAS A HISTORY OF  ALLERGY TO IODINE AND BETADINE.  She smokes one pack per day of  cigarettes.  She drinks one to two drinks per week of alcohol.   MEDICATIONS:  Her medications include multivitamins and vitamin C daily,  Welchol 625 mg two p.o. b.i.d., Pravachol 40 mg daily, Ziac 5/6.25  daily, Aleve one  b.i.d., and Fosamax 70 mg once per week.   SOCIAL HISTORY:  She attended college.  She has worked on her  master's  degree.  She taught kindergarten until 1989.  She is married, formerly  from [place], lives now in [place], does not have a primary care  physician.  She is independent with her activities of daily living.  She  does smoke cigarettes one pack per day which she smoked for many years,  part of this is related to the stress of taking care of her mother who  had Alzheimer's disease and her father had Parkinson's disease.   ALLERGIES:  SHE HAS ALLERGY TO IODINE AND BETAIODINE.   REVIEW OF SYSTEMS:  Medical review of systems is significant for a sore  to her heel which has not been healing well.   FAMILY HISTORY:  Her mother died at age 18 from Alzheimer's disease.  Her father died at 30 from Parkinson's disease.  She has one son 39 and  a daughter 43 living and well.   EXAMINATION:  Well-developed thin white female.  Blood pressure right  arm 80/60, left arm 140/80 and this was repeated.  She had left carotid  and left supraclavicular bruit louder than right carotid bruit.  Neck  flexion/extension maneuvers were unremarkable.  Mental status:  She was  alert.  She was aphasic.  She would follow one-, two- and three-step  commands.  She spoke with broken sentences.  She had poor repetition and  poor naming, could name only 1/6 objects at 5 minutes and became very  frustrated.  She could read and write but could not read out loud.  Her  cranial nerve examination revealed visual fields to be full, both disks  were seen and flat, the extraocular movements were full, there was a  slight right seventh, tongue was midline, the uvula was midline and gags  were present.  Sternocleidomastoid and trapezius testing were normal.  Motor examination revealed 5/5 strength upper and lower extremities.  Sensory examination with decreased two-point discrimination in her left  hand but pinprick was intact, deep tendon reflex were 2+, plantar  responses were downgoing.  Tympanic membranes were clear.  Mouth is in  good repair.  Lungs were clear.  Heart was without murmurs.  Bowel  sounds were normal.  There was no enlargement of liver, spleen or  kidneys.   STUDIES:  CT scan review of [facility] study showed bicerebral strokes,  right greater than left small vessel disease type.  EKG showed a normal  sinus rhythm though the initial EKG showed that the leads were probably  reversed.  Initial sodium is 131, potassium 3.8, chloride 110, CO2  content 24, BUN 6, creatinine 0.4, glucose 90 on Aug 10, 2006; repeat  revealed normal sodium, a potassium of 3.7, chloride 100, CO2 content  25, BUN 9, creatinine 0.53, glucose 108.  Her hemoglobin has dropped  from 13.2 down to 11.6 from Aug 10, 2006, through Aug 11, 2006.   IMPRESSION:  1. Aphasia (code 784.3).  2. Left brain stroke (code 434.01).  3. High blood pressure differential in the arms, rule out dissection      versus subclavian stenosis on the right.  4. Left greater than right carotid bruit (code 785.9).  5. Hypertension (code 796.2).  6. Hyperlipidemia (code 272.4). 7. Drop in hemoglobin  probably dilutional.  8. History of breast cancer status post left mastectomy and      chemotherapy and the code on the cancer is 174.9.   Plan at this time obtain MRI, MRA, repeat her CBC this day, continue her  on aspirin,  hemoccult stools.           ______________________________  Genene Churn. Sandria Manly, M.D.     JML/MEDQ  D:  08/11/2006  T:  08/11/2006  Job:  [id number]

## 2010-08-22 NOTE — H&P (Signed)
NAMEKALLE, BERNATH                  ACCOUNT NO.:  [id number]   MEDICAL RECORD NO.:  [id number]         PATIENT TYPE:  INP   LOCATION:  3003                         FACILITY:  [facility]   PHYSICIAN:  Marcello Moores, MD   DATE OF BIRTH:  1940/10/18   DATE OF ADMISSION:  08/11/2006  DATE OF DISCHARGE:  LH                              HISTORY & PHYSICAL   PRIMARY MEDICAL DOCTOR:  Unassigned.   CHIEF COMPLAINT:  Acute onset of inability to speak.   HISTORY OF PRESENT ILLNESS:  Ms. Craker is a 70 year old female patient  with history of chronic smoking, history of breast cancer and status  post mastectomy and a history of hypercholesterolemia.  She presented to  the emergency room, after she had an episode of difficulty speaking.  As  per the patient's husband, yesterday in the afternoon around 3:00 p.m.  she was at home, but they were in the front room, and he found that she  could not express what she wanted to say.  According to him, he  understood that she had this problem probably for an hour, and he called  911, and he brought her to the emergency room.  She was trying to tell  him, but she was unable to tell him.  Other than that, he did not notice  any weakness or other abnormality.  The patient was relatively in good  health, until that moment.  When she arrived to the emergency room, also  she had the same problem, but in between she was a little bit improved  and she was able to speak for a moment, according to the ER physician.  When I came to interview her, she had difficulty expressing, but she  understood everything.  She was able to eat and she was able to write,  as well.  On walking, she has not any gait, and she was walking in  stable.  I asked Dr. Colon Branch the possibility of transferring her to [facility] medically, but she had already spoke with Dr. Sandria Manly.  The  neurologist at [facility] assessed the case, as well as the  time for possibility of the TPA, and they  felt the time cap is already  passing, and they felt she could be managed at [facility], and  I was called to admit her.   REVIEW OF SYSTEMS:  A 10-point review of system is noncontributory and  limited, as the patient could not express her history.   ALLERGIES:  NOT KNOWN.   SOCIAL HISTORY:  She lives with her husband in [place].  Recently,  they moved from [place].  She is a chronic smoker but denied alcohol or  any drug abuse.   FAMILY HISTORY:  Unknown.   PAST MEDICAL HISTORY:  1. Chronic smoker.  2. History of breast cancer, status post surgery in 1999, as per the      patient's husband.  3. Hypercholesterolemia.  4. History of hypertension.   HOME MEDICATIONS:  1. Aleve  2. Fosamax.  3. Multivitamins.  4. Pravachol.  5. Ziac, all  of them unknown dose.   PHYSICAL EXAMINATION:  GENERAL:  The patient is lying on the bed,  without any respiratory distress.  VITAL SIGNS:  Blood pressure is 88 x 56, pulse is 80, respiratory rate  20, saturation 98, temperature is 97.2.  HEENT:  She has pink conjunctivae.  Nonicteric sclera.  Pupils are equal  and reactive.  NECK:  Supple.  No sign of meningeal irritation.  CHEST:  Good air entry.  CVS:  S1-S2, well-heard, regular, no murmur.  ABDOMEN:  Soft, flat.  No organomegaly was palpated, normoactive bowel  sounds.  EXTREMITIES:  No pedal edema.  CNS:  She is alert and well-oriented.  She was unable to express what  she want to express, but she understands well.  NEUROLOGICAL:  She has normal power, normal reflexes, normal tone.  There is not any sign of local deficit.  She can walk without any  problem.   LABS:  White blood cells 9.24, hemoglobin is 13.2, hematocrit is 38,  platelet count is 262.  Chemistry:  Sodium is 131, potassium 3.7,  chloride is 100, and bicarb is 25.  Glucose is 108, BUN 9, creatinine is  0.5.  PT/INR is 13.6 and 1.  CAT scan of the brain was done in the  emergency room, and the  preliminary report was some microvascular  disease.  Otherwise, there was not any acute bleeding or visible  infarct.   ASSESSMENT:  1. Expressive aphagia probably secondary to cerebrovascular accident      versus transient ischemic attack, and I will admit the patient to      the second floor and will put her on aspirin, Plavix and will      monitor her.  I will do an MRI/MRA of brain, will do carotid      Doppler and echocardiogram and will consult also neurologist and      will reassess her also during the night if there is any worsening      of any neurological symptoms or signs.  I discussed with the      husband the problem and what we are doing and what we are planning      to do and what is possible outcome in detail.  Second is low blood      pressure.  Her blood pressure is 105 x 80 and 88 x 56, and will      give her IV fluid and will monitor it as well.  2. Hypercholesterolemia.  I will put her on Lipitor.   PLAN:  The plan is will admit the patient to the floor, but if we see  any worsening of signs, we will transfer her to [facility] to  be treated by the neurologist.      Marcello Moores, MD  Electronically Signed     MT/MEDQ  D:  08/11/2006  T:  08/11/2006  Job:  [id number]

## 2010-08-22 NOTE — Procedures (Signed)
CAROTID DUPLEX EXAM   INDICATION:  Carotid artery disease.   HISTORY:  Diabetes:  No.  Cardiac:  Atrial fibrillation.  Hypertension:  Yes.  Smoking:  No.  Previous Surgery:  No.  CV History:  Asymptomatic.  Amaurosis Fugax , Paresthesias , Hemiparesis                                       RIGHT             LEFT  Brachial systolic pressure:  Brachial Doppler waveforms:  Vertebral direction of flow:        Retrograde        Antegrade  DUPLEX VELOCITIES (cm/sec)  CCA peak systolic                   28                98  ECA peak systolic                   39                103  ICA peak systolic                   23                208  ICA end diastolic                                     67  PLAQUE MORPHOLOGY:                  Heterogenous      Heterogenous  PLAQUE AMOUNT:                      Mild              Moderate  PLAQUE LOCATION:                    ICA               ICA, ECA   IMPRESSION:  1. Right internal carotid artery abnormal waveforms showing loss of      diastole, indicating proximal stenosis.  No significant plaque      noted in the internal carotid artery.  Right      brachiocephalic/proximal subclavian artery stenosis noted.  2. Left internal carotid artery velocities indicate a 60% to 79%      stenosis.   ___________________________________________  V. Charlena Cross, MD   EM/MEDQ  D:  12/19/2009  T:  12/19/2009  Job:  [id number]

## 2010-08-22 NOTE — Consult Note (Signed)
NAME:  [name], [name]                  ACCOUNT NO.:  [id number]   MEDICAL RECORD NO.:  [id number]          PATIENT TYPE:  OUT   LOCATION:  XRAY                         FACILITY:  [facility]   PHYSICIAN:  Sanjeev K. Deveshwar, M.D.DATE OF BIRTH:  07-Mar-1941   DATE OF CONSULTATION:  09/05/2006  DATE OF DISCHARGE:  08/23/2006                                 CONSULTATION   CHIEF COMPLAINT:  Innominate stenosis.   HISTORY OF PRESENT ILLNESS:  This is a very pleasant 70 year old female  who was admitted to [facility] from Aug 11, 2006 to Aug 13, 2006,  by Dr. Avie Echevaria with a new onset of a right frontal CVA.  An MRI, MRA  was performed on Aug 11, 2006, that was consistent with a 75% stenosis of  the left subclavian artery with a significant innominate artery  stenosis.  She was also noted to have bilateral vertebral artery  stenosis of approximately 75%.   The patient's initial presenting symptoms associated with her stroke was  inability to speak.  This gradually improved to the point where she is  essentially asymptomatic at this time.  Dr. Corliss Skains was contacted  during her hospital stay and arrangements were made to have her return  to [facility] on Aug 23, 2006, to undergo a cerebral angiogram  with possible intervention.   PAST MEDICAL HISTORY:  Is significant for:  1. Hyperlipidemia.  2. The above-noted CVA.  3. Hypertension.  4. Previous history of breast cancer.  5. She has a left occipital meningioma.  6. She has a long history of tobacco use.  She recently quit at the      time of her stroke.  7. She was also noted to have a pulmonary nodule in the left lower      lobe, approximately 4-to-5-mm , noted on chest x-ray.  A CT scan      was performed on Aug 12, 2006.  There was, at that time, no      pulmonary nodule to correspond with the nodule noted on the chest x-      ray.  A followup CT scan was recommended in 6-12 months.  She was      also noted have small  bilateral pleural effusions on that scan.   SURGICAL HISTORY:  1. Is significant for a left mastectomy.  The patient denies any      previous problems with anesthesia.  2. She also had exploratory laparotomy in 1961.   ALLERGIES:  She is allergic to:  1. IODINE.  2. BETADINE.   MEDICATIONS:  At the time of her most recent hospitalization include  Fosamax, multivitamin, Pravachol, Ziac, Welchol, aspirin, and Plavix.   SOCIAL HISTORY:  The patient is married.  She and her husband have two  children.  The patient and her husband recently moved to [place] from  [place].  She initially did not have a primary care physician.  She is  now being followed by Dr. Catalina Pizza.  She has a long history of tobacco  use.  She quit at the  time of her stroke.  She does not use alcohol.  She is college educated.  She taught kindergarten until 1989.   FAMILY HISTORY:  Her mother died at age 52, from complications of  Alzheimer's dementia.  Her father died at age 33, from complications of  Parkinson's disease.   IMPRESSION AND PLAN:  As noted, this patient was initially admitted to  [facility] on Aug 11, 2006, with a right frontal cerebrovascular  accident, which presented with aphasia and then gradually resolved.  She  was referred to Dr. Corliss Skains for a significant innominate stenosis  which was felt to have contributed to her stroke.  She was readmitted to  [facility] on Aug 23, 2006, for a cerebral angiogram and  possible PTA stenting of the innominate artery.  Please see Dr.  Fatima Sanger complete dictated report for full details.  Multiple  attempts were made using different catheters and guidewires to access  the nearly completely occluded innominate artery.  However, this  unfortunately was not successful.  The patient was later discharged from  the hospital that same day.  She returns today accompanied by her  husband to discuss other possible treatment options.   Dr.  Corliss Skains reviewed the images from the patient's recent angiogram.  He pointed out the areas of stenosis.  He felt at this time there were  three options.  One would be to continue medical therapy with aspirin  and Plavix without further attempts at intervention.  The second  consideration would be to reattempt the PTA stenting using the same  right femoral artery approach in hopes that the previous attempt had  caused some recannulization.  The other option would be to consider a  brachial approach at the stenosis.  The final option would be to  consider surgery, although this would require a sternotomy.  The patient  has been asymptomatic on her aspirin and Plavix.  As noted, she did quit  smoking and she was congratulated for this.   The patient and her husband decided they would like to try another  attempt at the PTA stenting procedure.  Dr. Corliss Skains recommended  waiting at least 3 months to see if there was any recannulization.  In  the meantime, the patient will continue on aspirin and Plavix therapy.  Dr. Corliss Skains also discussed this case with her neurologist, Dr. Sandria Manly.   The patient was told to call if she had any new or recurrent symptoms.  We will schedule her next intervention in approximately 3 months from  the previous intervention on Aug 23, 2006.   Greater than 30 minutes was spent on this consult.      Delton See, P.A.    ______________________________  Grandville Silos. Corliss Skains, M.D.    DR/MEDQ  D:  09/05/2006  T:  09/05/2006  Job:  [id number]   cc:   Catalina Pizza, M.D.  Genene Churn. Love, M.D.

## 2010-08-22 NOTE — Assessment & Plan Note (Signed)
OFFICE VISIT   [name], [name]  DOB:  1940-05-27                                       07/18/2009  WJXBJ#:[id number]   REASON FOR VISIT:  Carotid disease.   REFERRING PHYSICIAN:  Catalina Pizza, M.D.   HISTORY:  This is a 70 year old female seen at the request of Dr. Margo Aye  for evaluation of her carotid disease.  The patient has a history of  having a stroke in 2008.  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 Corliss Skains.  Her  stroke consisted of the inability to read and write as well as the  inability to talk.  Fortunately with medical management, the patient has  returned to near baseline.  She recently had a carotid ultrasound which  revealed moderate stenosis in her left carotid arteries.  She is sent to  me for further evaluation.   The patient suffers from hypertension and hypercholesterolemia, both of  which are medically managed.  Again, she has suffered a stroke, likely  secondary to her innominate disease.  She has nearly recovered from  this.   REVIEW OF SYSTEMS:  Positive for heart murmur, pain in her legs with  walking, history of a stroke, scoliosis and osteoporosis.  All other  review systems are negative as documented in the counter form.   PAST MEDICAL HISTORY:  Hypertension, hypercholesterolemia, history of  stroke, osteopenia.   FAMILY HISTORY:  Negative for cardiovascular disease at an early age.   SOCIAL HISTORY:  She is married with 2 children.  She is a retired  Runner, broadcasting/film/video, quit smoking in 2008, used to smoke 1 pack a day.  Drinks  occasional alcohol.   ALLERGIES TO MEDICATIONS:  Include iodine and Betadine.   MEDICATIONS:  Please see medical record.   PHYSICAL EXAMINATION:  Vital signs:  Heart rate 94, blood pressure  118/96, temperature is 97.7.  General:  She is well-appearing in no  distress.  HEENT:  Within normal limits.  Lungs are clear bilaterally.  Cardiovascular:   Regular rate and rhythm.  Positive systolic ejection  murmur, positive left carotid bruit.  Abdomen:  Soft, nontender.  No  pulsatile mass.  No hepatosplenomegaly.  Musculoskeletal:  Without major  deformities.  Neuro:  She has no focal weakness or paresthesias.  Skin  is without rash.  Extremities are warm and well-perfused.  She has  palpable pedal pulses.   DIAGNOSTICS:  The patient comes with a report of her ultrasound which  reveals some right foot velocities on the right carotid at 49/12, on the  left 168/46.   ASSESSMENT:  Moderate left carotid stenosis.   PLAN:  At this point, the patient continues to be asymptomatic from  extracranial carotid disease.  She has a moderate stenosis on the left  side which will need to be followed closely.  I reviewed the notes from  Dr. Fatima Sanger angiogram.  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  The  decrease in carotid velocities on the right are secondary to this.   At this point, I think the best course of management is going to be  medical therapy for her vascular disease.  I plan on seeing her back in  6 months.  Obviously, we will need to be very meticulous about following  her left carotid stenosis, if  it were to progress or if she were to  start to have symptoms, we would need to discuss operative intervention.     Jorge Ny, MD  Electronically Signed   VWB/MEDQ  D:  07/18/2009  T:  07/19/2009  Job:  [id number]   cc:   Catalina Pizza, M.D.

## 2010-08-22 NOTE — Assessment & Plan Note (Signed)
OFFICE VISIT   [name], [name]  DOB:  1940/04/21                                       12/19/2009  EAVWU#:[id number]   Ms. [name] comes in today for further evaluation and follow-up of her  carotid disease.  I first met her in April 2011.  She has a history of a  stroke in 2008.  She has been diagnosed with an occluded innominate  artery in 2008 via an angiography Dr. Glyn Ade.  She had 60-79%  stenosis in her left carotid.  Last time I saw her she had abnormal  waveforms on the right indicating proximal stenosis which was known to  be an occluded innominate artery.  Since our last visit, on July 4, the  patient had a syncopal episode where she passed out.  She was admitted  to the hospital and underwent full workup.  She comes back in to me to  evaluate her carotid disease.  The patient has no specific symptoms  regarding numbness or weakness in either extremity, amaurosis fugax or  slurring of her speech.  Her only problem has been passing out.   The patient continues to be medically managed for her hypertension and  hypercholesterolemia.   REVIEW OF SYSTEMS:  Positive for  scoliosis and history of stroke.  Negative for weight gain or weight loss.  Negative for chest pain and  shortness breath.  Positive for dizziness and passing out as documented  in the HPI.  All other review systems are negative.   PHYSICAL EXAMINATION:  Heart rate 67, blood pressure 154/87 in the left  arm, temperature 98.2.  GENERAL:  She is well-appearing, in no distress.  HEENT:  Within normal limits.  LUNGS:  Clear bilaterally.  CARDIOVASCULAR:  Regular rate and rhythm.  No carotid bruits.  Right  radial pulse is not palpable.  Left radial pulse is palpable.  ABDOMEN:  Soft, nontender.  NEURO:  She has no focal deficits.  SKIN:  Without rash.   DIAGNOSTICS:  I have ordered and independently reviewed her ultrasound.  This shows abnormal right-sided waveforms consistent with  proximal  stenosis.  No significant carotid plaque identified.  The left carotid  indicates 60-79% stenosis.   ASSESSMENT/PLAN:  Innominate occlusion, left carotid stenosis.   I spent in excess of 45 minutes today reviewing the patient's medical  records and discussing her situation with her.  I do not feel that her  symptoms that occurred on July 4 consisting of dizziness and passing out  are related to her extracranial carotid occlusive disease.  She  continues to have 60-79% stenosis of left carotid.  I think this needs  to be monitored.  We would consider intervening if she becomes  symptomatic or if her stenosis progresses to greater than 80% on the  left side.  I will plan on seeing her back in 6 months with a repeat  ultrasound.     Colleen Ny, MD  Electronically Signed   VWB/MEDQ  D:  12/19/2009  T:  12/20/2009  Job:  [id number]

## 2010-08-23 ENCOUNTER — Ambulatory Visit (HOSPITAL_COMMUNITY)
Admission: RE | Admit: 2010-08-23 | Discharge: 2010-08-23 | Disposition: A | Discharge status: Home / Self Care | Payer: Medicare Other | Source: Ambulatory Visit | Attending: Internal Medicine | Admitting: Internal Medicine

## 2010-08-25 ENCOUNTER — Ambulatory Visit (HOSPITAL_COMMUNITY)
Admission: RE | Admit: 2010-08-25 | Discharge: 2010-08-25 | Disposition: A | Discharge status: Home / Self Care | Payer: Medicare Other | Source: Ambulatory Visit | Attending: Internal Medicine | Admitting: Internal Medicine

## 2010-08-28 ENCOUNTER — Ambulatory Visit (HOSPITAL_COMMUNITY): Payer: Medicare Other | Admitting: Physical Therapy

## 2010-08-30 ENCOUNTER — Ambulatory Visit (INDEPENDENT_AMBULATORY_CARE_PROVIDER_SITE_OTHER): Payer: Medicare Other | Admitting: *Deleted

## 2010-08-30 ENCOUNTER — Ambulatory Visit (HOSPITAL_COMMUNITY)
Admission: RE | Admit: 2010-08-30 | Discharge: 2010-08-30 | Disposition: A | Discharge status: Home / Self Care | Payer: Medicare Other | Source: Ambulatory Visit | Attending: Internal Medicine | Admitting: Internal Medicine

## 2010-08-30 DIAGNOSIS — I4891 Unspecified atrial fibrillation: Secondary | ICD-10-CM

## 2010-08-30 DIAGNOSIS — Z7901 Long term (current) use of anticoagulants: Secondary | ICD-10-CM

## 2010-09-01 ENCOUNTER — Ambulatory Visit (HOSPITAL_COMMUNITY)
Admission: RE | Admit: 2010-09-01 | Discharge: 2010-09-01 | Disposition: A | Discharge status: Home / Self Care | Payer: Medicare Other | Source: Ambulatory Visit | Attending: Internal Medicine | Admitting: Internal Medicine

## 2010-09-01 DIAGNOSIS — IMO0001 Reserved for inherently not codable concepts without codable children: Secondary | ICD-10-CM | POA: Insufficient documentation

## 2010-09-01 DIAGNOSIS — L97309 Non-pressure chronic ulcer of unspecified ankle with unspecified severity: Secondary | ICD-10-CM | POA: Insufficient documentation

## 2010-09-05 ENCOUNTER — Ambulatory Visit (HOSPITAL_COMMUNITY)
Admission: RE | Admit: 2010-09-05 | Discharge: 2010-09-05 | Disposition: A | Discharge status: Home / Self Care | Payer: Medicare Other | Source: Ambulatory Visit | Attending: Internal Medicine | Admitting: Internal Medicine

## 2010-09-08 ENCOUNTER — Ambulatory Visit (HOSPITAL_COMMUNITY)
Admission: RE | Admit: 2010-09-08 | Discharge: 2010-09-08 | Disposition: A | Discharge status: Home / Self Care | Payer: Medicare Other | Source: Ambulatory Visit | Attending: Internal Medicine | Admitting: Internal Medicine

## 2010-09-08 DIAGNOSIS — L97309 Non-pressure chronic ulcer of unspecified ankle with unspecified severity: Secondary | ICD-10-CM | POA: Insufficient documentation

## 2010-09-08 DIAGNOSIS — IMO0001 Reserved for inherently not codable concepts without codable children: Secondary | ICD-10-CM | POA: Insufficient documentation

## 2010-09-11 ENCOUNTER — Ambulatory Visit (HOSPITAL_COMMUNITY)
Admission: RE | Admit: 2010-09-11 | Discharge: 2010-09-11 | Disposition: A | Discharge status: Home / Self Care | Payer: Medicare Other | Source: Ambulatory Visit | Attending: Internal Medicine | Admitting: Internal Medicine

## 2010-09-12 ENCOUNTER — Other Ambulatory Visit: Payer: Self-pay | Admitting: Anesthesiology

## 2010-09-12 ENCOUNTER — Encounter (HOSPITAL_COMMUNITY): Payer: Medicare Other

## 2010-09-12 LAB — BASIC METABOLIC PANEL
GFR calc Af Amer: >60 mL/min (ref >60)
GFR calc non Af Amer: >60 mL/min (ref >60)
Glucose, Bld: 108 mg/dL — ABNORMAL HIGH (ref 70–99)
Potassium: 4.7 mEq/L (ref 3.5–5.1)
Sodium: 136 mEq/L (ref 135–145)

## 2010-09-12 LAB — HEMOGLOBIN AND HEMATOCRIT, BLOOD: Hemoglobin: 10.9 g/dL — ABNORMAL LOW (ref 12.0–15.0)

## 2010-09-13 ENCOUNTER — Ambulatory Visit (HOSPITAL_COMMUNITY)
Admission: RE | Admit: 2010-09-13 | Discharge: 2010-09-13 | Disposition: A | Discharge status: Home / Self Care | Payer: Medicare Other | Source: Ambulatory Visit | Attending: Internal Medicine | Admitting: Internal Medicine

## 2010-09-13 ENCOUNTER — Other Ambulatory Visit (HOSPITAL_COMMUNITY): Payer: Medicare Other

## 2010-09-15 ENCOUNTER — Ambulatory Visit (HOSPITAL_COMMUNITY)
Admission: RE | Admit: 2010-09-15 | Discharge: 2010-09-15 | Disposition: A | Discharge status: Home / Self Care | Payer: Medicare Other | Source: Ambulatory Visit | Attending: Internal Medicine | Admitting: Internal Medicine

## 2010-09-15 DIAGNOSIS — L97309 Non-pressure chronic ulcer of unspecified ankle with unspecified severity: Secondary | ICD-10-CM | POA: Insufficient documentation

## 2010-09-15 DIAGNOSIS — IMO0001 Reserved for inherently not codable concepts without codable children: Secondary | ICD-10-CM | POA: Insufficient documentation

## 2010-09-18 ENCOUNTER — Inpatient Hospital Stay (HOSPITAL_COMMUNITY)
Admission: RE | Admit: 2010-09-18 | Discharge: 2010-09-18 | Disposition: A | Discharge status: Home / Self Care | Payer: Medicare Other | Source: Ambulatory Visit | Attending: *Deleted | Admitting: *Deleted

## 2010-09-19 ENCOUNTER — Ambulatory Visit (HOSPITAL_COMMUNITY)
Admission: RE | Admit: 2010-09-19 | Discharge: 2010-09-19 | Disposition: A | Discharge status: Home / Self Care | Payer: Medicare Other | Source: Ambulatory Visit | Attending: Ophthalmology | Admitting: Ophthalmology

## 2010-09-19 DIAGNOSIS — Z79899 Other long term (current) drug therapy: Secondary | ICD-10-CM | POA: Insufficient documentation

## 2010-09-19 DIAGNOSIS — H2510 Age-related nuclear cataract, unspecified eye: Secondary | ICD-10-CM | POA: Insufficient documentation

## 2010-09-19 DIAGNOSIS — Z7982 Long term (current) use of aspirin: Secondary | ICD-10-CM | POA: Insufficient documentation

## 2010-09-19 DIAGNOSIS — Z01812 Encounter for preprocedural laboratory examination: Secondary | ICD-10-CM | POA: Insufficient documentation

## 2010-09-19 DIAGNOSIS — I10 Essential (primary) hypertension: Secondary | ICD-10-CM | POA: Insufficient documentation

## 2010-09-20 ENCOUNTER — Ambulatory Visit (HOSPITAL_COMMUNITY)
Admission: RE | Admit: 2010-09-20 | Discharge: 2010-09-20 | Disposition: A | Discharge status: Home / Self Care | Payer: Medicare Other | Source: Ambulatory Visit | Attending: Ophthalmology | Admitting: Ophthalmology

## 2010-09-20 LAB — POC HEMOCCULT BLD/STL (HOME/3-CARD/SCREEN)

## 2010-09-22 ENCOUNTER — Ambulatory Visit (HOSPITAL_COMMUNITY)
Admission: RE | Admit: 2010-09-22 | Discharge: 2010-09-22 | Disposition: A | Discharge status: Home / Self Care | Payer: Medicare Other | Source: Ambulatory Visit | Attending: Internal Medicine | Admitting: Internal Medicine

## 2010-09-25 ENCOUNTER — Ambulatory Visit (HOSPITAL_COMMUNITY)
Admission: RE | Admit: 2010-09-25 | Discharge: 2010-09-25 | Disposition: A | Discharge status: Home / Self Care | Payer: Medicare Other | Source: Ambulatory Visit | Attending: Internal Medicine | Admitting: Internal Medicine

## 2010-09-26 ENCOUNTER — Other Ambulatory Visit (HOSPITAL_COMMUNITY): Payer: Medicare Other

## 2010-09-27 ENCOUNTER — Ambulatory Visit (INDEPENDENT_AMBULATORY_CARE_PROVIDER_SITE_OTHER): Payer: Medicare Other | Admitting: *Deleted

## 2010-09-27 ENCOUNTER — Ambulatory Visit (HOSPITAL_COMMUNITY)
Admission: RE | Admit: 2010-09-27 | Discharge: 2010-09-27 | Disposition: A | Discharge status: Home / Self Care | Payer: Medicare Other | Source: Ambulatory Visit | Attending: Internal Medicine | Admitting: Internal Medicine

## 2010-09-27 DIAGNOSIS — I4891 Unspecified atrial fibrillation: Secondary | ICD-10-CM

## 2010-09-27 DIAGNOSIS — Z7901 Long term (current) use of anticoagulants: Secondary | ICD-10-CM

## 2010-09-29 ENCOUNTER — Ambulatory Visit (HOSPITAL_COMMUNITY)
Admission: RE | Admit: 2010-09-29 | Discharge: 2010-09-29 | Disposition: A | Discharge status: Home / Self Care | Payer: Medicare Other | Source: Ambulatory Visit | Attending: Internal Medicine | Admitting: Internal Medicine

## 2010-10-01 ENCOUNTER — Other Ambulatory Visit: Payer: Self-pay | Admitting: *Deleted

## 2010-10-01 MED ORDER — AMIODARONE HCL 200 MG PO TABS: 200.0000 mg | ORAL_TABLET | Freq: Two times a day (BID) | ORAL | Status: DC

## 2010-10-01 MED ORDER — METOPROLOL TARTRATE 25 MG PO TABS: 25.0000 mg | ORAL_TABLET | Freq: Two times a day (BID) | ORAL | Status: DC

## 2010-10-02 ENCOUNTER — Ambulatory Visit (HOSPITAL_COMMUNITY)
Admission: RE | Admit: 2010-10-02 | Discharge: 2010-10-02 | Disposition: A | Discharge status: Home / Self Care | Payer: Medicare Other | Source: Ambulatory Visit | Attending: Internal Medicine | Admitting: Internal Medicine

## 2010-10-03 ENCOUNTER — Ambulatory Visit (HOSPITAL_COMMUNITY)
Admission: RE | Admit: 2010-10-03 | Discharge: 2010-10-03 | Disposition: A | Discharge status: Home / Self Care | Payer: Medicare Other | Source: Ambulatory Visit | Attending: Ophthalmology | Admitting: Ophthalmology

## 2010-10-03 DIAGNOSIS — Z79899 Other long term (current) drug therapy: Secondary | ICD-10-CM | POA: Insufficient documentation

## 2010-10-03 DIAGNOSIS — I10 Essential (primary) hypertension: Secondary | ICD-10-CM | POA: Insufficient documentation

## 2010-10-03 DIAGNOSIS — Z7982 Long term (current) use of aspirin: Secondary | ICD-10-CM | POA: Insufficient documentation

## 2010-10-03 DIAGNOSIS — H2510 Age-related nuclear cataract, unspecified eye: Secondary | ICD-10-CM | POA: Insufficient documentation

## 2010-10-04 ENCOUNTER — Ambulatory Visit (HOSPITAL_COMMUNITY)
Admission: RE | Admit: 2010-10-04 | Discharge: 2010-10-04 | Disposition: A | Discharge status: Home / Self Care | Payer: Medicare Other | Source: Ambulatory Visit | Attending: Ophthalmology | Admitting: Ophthalmology

## 2010-10-06 ENCOUNTER — Ambulatory Visit (HOSPITAL_COMMUNITY)
Admission: RE | Admit: 2010-10-06 | Discharge: 2010-10-06 | Disposition: A | Discharge status: Home / Self Care | Payer: Medicare Other | Source: Ambulatory Visit | Attending: Internal Medicine | Admitting: Internal Medicine

## 2010-10-09 ENCOUNTER — Ambulatory Visit (HOSPITAL_COMMUNITY)
Admission: RE | Admit: 2010-10-09 | Discharge: 2010-10-09 | Disposition: A | Discharge status: Home / Self Care | Payer: Medicare Other | Source: Ambulatory Visit | Attending: Internal Medicine | Admitting: Internal Medicine

## 2010-10-09 DIAGNOSIS — IMO0001 Reserved for inherently not codable concepts without codable children: Secondary | ICD-10-CM | POA: Insufficient documentation

## 2010-10-09 DIAGNOSIS — L97309 Non-pressure chronic ulcer of unspecified ankle with unspecified severity: Secondary | ICD-10-CM | POA: Insufficient documentation

## 2010-10-12 ENCOUNTER — Ambulatory Visit (HOSPITAL_COMMUNITY)
Admission: RE | Admit: 2010-10-12 | Discharge: 2010-10-12 | Disposition: A | Discharge status: Home / Self Care | Payer: Medicare Other | Source: Ambulatory Visit | Attending: Internal Medicine | Admitting: Internal Medicine

## 2010-10-16 ENCOUNTER — Inpatient Hospital Stay (HOSPITAL_COMMUNITY): Admission: RE | Admit: 2010-10-16 | Payer: Medicare Other | Source: Ambulatory Visit

## 2010-10-16 ENCOUNTER — Ambulatory Visit (HOSPITAL_COMMUNITY)
Admission: RE | Admit: 2010-10-16 | Discharge: 2010-10-16 | Disposition: A | Discharge status: Home / Self Care | Payer: Medicare Other | Source: Ambulatory Visit | Attending: Internal Medicine | Admitting: Internal Medicine

## 2010-10-16 NOTE — Progress Notes (Signed)
Physical Therapy Treatment Patient Name: RUTHE ROEMER BJYNW'G Date: 10/16/2010  Precautions/Restrictions     Mobility (including Balance)       Exercise/Treatments @FLOW [phone]  Goals PT Short Term Goals Short Term Goal 1: Necrotic tissue to be 0%, granulation to be 100% Short Term Goal 2: Pt/family will be independent with dressing changes/self-care. Short Term Goal 3: Decreased wound size by 1.5cm X 0.5cm X 0.25cm Short Term Goal 4: Decreased drainage equal or less than scant PT Long Term Goals Long Term Goal 1: LTG's equal to STG's End of Session Patient Active Problem List  Diagnoses  . HYPERLIPIDEMIA  . HYPERTENSION  . ATRIAL FIBRILLATION  . ACUTE ON CHRONIC DIASTOLIC HEART FAILURE  . Occlusion and stenosis of carotid artery without mention of cerebral infarction  . SYNCOPE AND COLLAPSE  . Long term current use of anticoagulant   General Behavior During Session: WFL for tasks performed PT Assessment and Plan Rehab Potential: Good PT Frequency: Min 3X/week PT Duration: 4 weeks PT Treatment/Interventions: Patient/family education PT Plan: Continue wound care to promote wound closure   Bascom Levels, Amy B 10/16/2010, 10:59 AM

## 2010-10-18 ENCOUNTER — Ambulatory Visit (HOSPITAL_COMMUNITY)
Admission: RE | Admit: 2010-10-18 | Discharge: 2010-10-18 | Disposition: A | Discharge status: Home / Self Care | Payer: Medicare Other | Source: Ambulatory Visit | Attending: Internal Medicine | Admitting: Internal Medicine

## 2010-10-18 NOTE — Progress Notes (Signed)
Physical Therapy Treatment Patient Name: NEAL OSHEA ZOXWR'U Date: 10/18/2010    Time in:  8:12 am Time out:  9:28am Visits #: 3 of 8; 58 total to date Charges: debridement less than 20 cm; estim unattended  SUBJECTIVE:  Pt reports no pain unless we "dig in" it.  OBJECTIVE:  Irrigation, excisional debridement to wound, followed by estim high-volt using negative polarity for 45 minutes.  Dressed wound using hydrogel impregnated 2X2 packed into wound.  Secured with 3 inch conform   GOALS: PT Short Term Goals Long Term Goal 1 Progress: Progressing toward goal Long Term Goal 2 Progress: Met Long Term Goal 3 Progress: Progressing toward goal Long Term Goal 4 Progress: Progressing toward goal   End of Session Patient Active Problem List  Diagnoses  . HYPERLIPIDEMIA  . HYPERTENSION  . ATRIAL FIBRILLATION  . ACUTE ON CHRONIC DIASTOLIC HEART FAILURE  . Occlusion and stenosis of carotid artery without mention of cerebral infarction  . SYNCOPE AND COLLAPSE  . Long term current use of anticoagulant   PT - End of Session Activity Tolerance: Patient tolerated treatment well General Behavior During Session: [facility] for tasks performed Cognition: [facility] for tasks performed PT Assessment and Plan Clinical Impression Statement: Pt able to tolerate debridement better Rehab Potential: Good PT Plan: continue woundcare and E-stim to promote wound closure   Emeline Gins B 10/18/2010, 3:54 PM

## 2010-10-20 ENCOUNTER — Ambulatory Visit (HOSPITAL_COMMUNITY)
Admission: RE | Admit: 2010-10-20 | Discharge: 2010-10-20 | Disposition: A | Discharge status: Home / Self Care | Payer: Medicare Other | Source: Ambulatory Visit | Attending: Internal Medicine | Admitting: Internal Medicine

## 2010-10-20 NOTE — Progress Notes (Signed)
  Patient Name: [name] MRN: [id number] Today's Date: 10/20/2010       Physical Therapy Treatment Note  Time In: 8:04 Time Out: 9:45 Visits #: 4 of 8; 59 total to date  Subjective: no pain today    Objective: Irrigation, excisional debridement to wound, followed by estim high-volt using positive polarity for 60 minutes. Dressed wound using hydrogel impregnated 2X2 packed into wound. Secured with 1.4x 3.5 inch.   Assessment:Clinical Impression Statement: Pt able to tolerate debridement better  Rehab Potential: Good   PT Plan: continue woundcare and E-stim posivite polarity for 2 more sessions to promote wound closure   Charges: debridement less than 20 cm; estim unattended x 60  min   Becky Sax, PTA  [name] 10/20/2010, 8:25 AM

## 2010-10-23 ENCOUNTER — Ambulatory Visit (HOSPITAL_COMMUNITY)
Admission: RE | Admit: 2010-10-23 | Discharge: 2010-10-23 | Disposition: A | Discharge status: Home / Self Care | Payer: Medicare Other | Source: Ambulatory Visit | Attending: Internal Medicine | Admitting: Internal Medicine

## 2010-10-23 ENCOUNTER — Encounter (INDEPENDENT_AMBULATORY_CARE_PROVIDER_SITE_OTHER): Payer: Self-pay

## 2010-10-23 DIAGNOSIS — S91009A Unspecified open wound, unspecified ankle, initial encounter: Secondary | ICD-10-CM | POA: Insufficient documentation

## 2010-10-23 NOTE — Progress Notes (Signed)
Physical Therapy Treatment Patient Name: [name] Date: 10/23/2010  HPI: Symptoms/Limitations Symptoms: No pain.  "I think it is doing better overall" Pain Assessment Currently in Pain?: No/denies   Exercise/Treatments  Debribment to L ankle see progress notes.   Pharmacologist Location: L ankle wound Electrical Stimulation Action: promote blood flow and promote wound healing Electrical Stimulation Parameters: high volt, #2 negative polarity 120 volts for 60 minutes Electrical Stimulation Goals: Wound  Goals PT Short Term Goals Short Term Goal 1 Progress: Progressing toward goal Short Term Goal 2 Progress: Progressing toward goal Short Term Goal 3 Progress: Progressing toward goal Short Term Goal 4 Progress: Progressing toward goal End of Session Patient Active Problem List  Diagnoses  . HYPERLIPIDEMIA  . HYPERTENSION  . ATRIAL FIBRILLATION  . ACUTE ON CHRONIC DIASTOLIC HEART FAILURE  . Occlusion and stenosis of carotid artery without mention of cerebral infarction  . SYNCOPE AND COLLAPSE  . Long term current use of anticoagulant  . Wound of ankle   PT - End of Session Activity Tolerance: Patient tolerated treatment well General Behavior During Session: [facility] for tasks performed Cognition: [facility] for tasks performed PT Assessment and Plan Clinical Impression Statement: Pt wound is becoming shallow at the superior border and has improved granualtion    [name] 10/23/2010, 11:59 AM

## 2010-10-25 ENCOUNTER — Ambulatory Visit (HOSPITAL_COMMUNITY)
Admission: RE | Admit: 2010-10-25 | Discharge: 2010-10-25 | Disposition: A | Discharge status: Home / Self Care | Payer: Medicare Other | Source: Ambulatory Visit

## 2010-10-25 ENCOUNTER — Ambulatory Visit (INDEPENDENT_AMBULATORY_CARE_PROVIDER_SITE_OTHER): Payer: Medicare Other | Admitting: *Deleted

## 2010-10-25 DIAGNOSIS — Z7901 Long term (current) use of anticoagulants: Secondary | ICD-10-CM

## 2010-10-25 DIAGNOSIS — I4891 Unspecified atrial fibrillation: Secondary | ICD-10-CM

## 2010-10-25 LAB — POCT INR: INR: 2.2

## 2010-10-25 NOTE — Progress Notes (Signed)
Physical Therapy Treatment Patient Name: [name] EAVWU'J Date: 10/25/2010  HPI: Symptoms/Limitations Symptoms: No pain, "I think it is looking better", Monday night was the first night that it has hurt in a long time.   Exercise/Treatments   Modalities Modalities: Copywriter, advertising Location: L ankle wound Electrical Stimulation Action: promote blood flow and promote wound healing Electrical Stimulation Parameters: high volt, #3 negative polarity 150 volts for 20 minutes Electrical Stimulation Goals: Wound  Goals   End of Session Patient Active Problem List  Diagnoses  . HYPERLIPIDEMIA  . HYPERTENSION  . ATRIAL FIBRILLATION  . ACUTE ON CHRONIC DIASTOLIC HEART FAILURE  . Occlusion and stenosis of carotid artery without mention of cerebral infarction  . SYNCOPE AND COLLAPSE  . Long term current use of anticoagulant  . Wound of ankle   General Behavior During Session: [facility] for tasks performed Cognition: [facility] for tasks performed PT Assessment and Plan Clinical Impression Statement: Pt wound is becoming shallow at the superior border and has improved granualtion Rehab Potential: Good PT Frequency: Min 3X/week PT Duration: 4 weeks PT Plan: continue woundcare and estim to promote wound closure., begin positive polarity next treatment.   [name] 10/25/2010, 9:00 AM

## 2010-10-27 ENCOUNTER — Ambulatory Visit (HOSPITAL_COMMUNITY)
Admission: RE | Admit: 2010-10-27 | Discharge: 2010-10-27 | Disposition: A | Discharge status: Home / Self Care | Payer: Medicare Other | Source: Ambulatory Visit | Attending: Internal Medicine | Admitting: Internal Medicine

## 2010-10-27 NOTE — Progress Notes (Signed)
Physical Therapy Treatment Patient Name: [name] Date: 10/27/2010  Time In: 8:04 Time Out: 9:40 Visit #: total 61 Next Re-eval: 11/12/2010 Charge: selective debridement <20cm  Subjective Symptoms/Limitations Symptoms: No pain today, I think it is looking/feeling better.  Objective:   see wound doc flowsheet    Exercise/Treatments:  wound care with estim positive polarity, debridement, dressings complete    Assessment:  wound become more superficial, increase granulation borders closing.    Plan:  continue woundcare with positive polarity x 2 more sessions to promote wound closure.  [name] 10/27/2010, 1:30 PM

## 2010-10-30 ENCOUNTER — Ambulatory Visit (HOSPITAL_COMMUNITY)
Admission: RE | Admit: 2010-10-30 | Discharge: 2010-10-30 | Disposition: A | Discharge status: Home / Self Care | Payer: Medicare Other | Source: Ambulatory Visit | Attending: Internal Medicine | Admitting: Internal Medicine

## 2010-10-30 NOTE — Progress Notes (Signed)
Physical Therapy Treatment Patient Name: [name] ZOXWR'U Date: 10/30/2010 Time: 805-925 Charges 1 unit Deb <20cm, 1 unitunattended e-stim HPI: Symptoms/Limitations Symptoms: "i'm doing okay.  I was able to go a day and a half without changing it at least one time"     Exercise/Treatments  Irrigation w/debridment with forceps and q-tip Museum/gallery exhibitions officer Stimulation Location: L lateral ankle wound  Electrical Stimulation Action: promote blood flow and promote wound healing  Electrical Stimulation Parameters: high volt, #2 positive polarity 105 volts for 60 min  Wound care with 2x2 w/ saline and hydrogel covered by 4x4.  Goals PT Short Term Goals Short Term Goal 1 Progress: Progressing toward goal Short Term Goal 2 Progress: Met Short Term Goal 3 Progress: Progressing toward goal Short Term Goal 4 Progress: Progressing toward goal End of Session Patient Active Problem List  Diagnoses  . HYPERLIPIDEMIA  . HYPERTENSION  . ATRIAL FIBRILLATION  . ACUTE ON CHRONIC DIASTOLIC HEART FAILURE  . Occlusion and stenosis of carotid artery without mention of cerebral infarction  . SYNCOPE AND COLLAPSE  . Long term current use of anticoagulant  . Wound of ankle   PT Assessment and Plan Clinical Impression Statement: Pt wound is becoming more superficial and has improved granulation and wound is becoming pulled in at the margins with a healthy periwound.  PT Plan: Continue to progress.  Use Positive Polarity x1 to promote wound closure.  Kalup Jaquith 10/30/2010, 8:36 AM

## 2010-10-30 NOTE — Patient Instructions (Addendum)
Educated patient to keep dressing on for 2 days to promote healing.

## 2010-11-01 ENCOUNTER — Ambulatory Visit (HOSPITAL_COMMUNITY)
Admission: RE | Admit: 2010-11-01 | Discharge: 2010-11-01 | Disposition: A | Discharge status: Home / Self Care | Payer: Medicare Other | Source: Ambulatory Visit | Attending: Internal Medicine | Admitting: Internal Medicine

## 2010-11-01 NOTE — Progress Notes (Signed)
Physical Therapy Treatment Patient Name: [name] NFAOZ'H Date: 11/01/2010  Time In: 8:08 Time Out: 9:50 Visit #: 63 Next Re-eval: 11/12/2010 Charge: selective debridement <20 cm estim unattended   Subjective: Symptoms/Limitations Symptoms: I'm doing good today, no pain.  Objective:  Exercise/Treatments  Irrigation, debridement, dressings, estim high volt positive polarity #3  Modalities Modalities: Archivist Stimulation Location: L lateral ankle wound Electrical Stimulation Action: promote blood flow and wound healing Electrical Stimulation Parameters: high volt, #3 positive polarity 150 volts for 60 min Electrical Stimulation Goals: Wound  Goals   End of Session Patient Active Problem List  Diagnoses  . HYPERLIPIDEMIA  . HYPERTENSION  . ATRIAL FIBRILLATION  . ACUTE ON CHRONIC DIASTOLIC HEART FAILURE  . Occlusion and stenosis of carotid artery without mention of cerebral infarction  . SYNCOPE AND COLLAPSE  . Long term current use of anticoagulant  . Wound of ankle   PT - End of Session Activity Tolerance: Patient tolerated treatment well General Behavior During Session: [facility] for tasks performed Cognition: [facility] for tasks performed PT Assessment and Plan Clinical Impression Statement: wound becoming more superficial, increase granulation borders closing PT Plan: Continue to progress, change to negative polarity next tx to promote wound closure.  [name] 11/01/2010, 10:30 AM

## 2010-11-03 ENCOUNTER — Ambulatory Visit (HOSPITAL_COMMUNITY)
Admission: RE | Admit: 2010-11-03 | Discharge: 2010-11-03 | Disposition: A | Discharge status: Home / Self Care | Payer: Medicare Other | Source: Ambulatory Visit | Attending: Internal Medicine | Admitting: Internal Medicine

## 2010-11-03 ENCOUNTER — Telehealth: Payer: Self-pay | Admitting: Cardiovascular Disease

## 2010-11-03 NOTE — Telephone Encounter (Signed)
Called and spoke with patient and let her know that [name] Dr Fabio Bering nurse will have to make the approval for this change and call her back  She said okay to call back on Monday anytime after 10:00am

## 2010-11-03 NOTE — Telephone Encounter (Signed)
Per pt call, pt wants to set up appt with Nishan. Pt doesn't want appt until after September 17th, pt will be out of town September 23-October 11. Pt appt recall is for [place] office. There are no "available" appts in [place] for the remainder of the year that pt will be in town and available to attend.  Pt husband has appt w/ Nishan in [place], November 21, 2010. Pt wants to know if she too can come to [facility] for appt w/ Nishan.  I was informed that [place] pt's should not be scheduled in [facility] unless approval has been received.  Please return pt call to advise/discuss.

## 2010-11-03 NOTE — Progress Notes (Signed)
Physical Therapy Treatment Patient Name: [name] [id number] Date: 11/03/2010  Time In: 8:10 Time Out: 9:15  Visit #: 64  Next Re-eval: 11/12/2010  Charge: selective debridement <20 cm  estim unattended    HPI: Symptoms/Limitations Symptoms: No pain. Pain Assessment Currently in Pain?: No/denies   Exercise/Treatments Irrigation, debridement, dressings, estim high volt negative polarity #1  Exercise/Treatments   Modalities Modalities: Copywriter, advertising Location: L lateral ankle wound Electrical Stimulation Action: promote blood flow and wound healing Electrical Stimulation Parameters: high volt #1 negative polarity 150 volts for 60 min  Electrical Stimulation Goals: Wound  Goals PT Short Term Goals Short Term Goal 1 Progress: Progressing toward goal Short Term Goal 2 Progress: Met Short Term Goal 3 Progress: Progressing toward goal Short Term Goal 4 Progress: Progressing toward goal End of Session Patient Active Problem List  Diagnoses  . HYPERLIPIDEMIA  . HYPERTENSION  . ATRIAL FIBRILLATION  . ACUTE ON CHRONIC DIASTOLIC HEART FAILURE  . Occlusion and stenosis of carotid artery without mention of cerebral infarction  . SYNCOPE AND COLLAPSE  . Long term current use of anticoagulant  . Wound of ankle   PT Assessment and Plan Clinical Impression Statement: Pt with decreased sensitivity to debridement. Wound with margin approximation. PT Treatment/Interventions: Other (comment);Stair training (wound debridement = or < 20cm) PT Plan: Continue to progress. Continue with negative polarity tx #2 next tx.  Seth Bake [facility] 11/03/2010, 10:19 AM

## 2010-11-06 ENCOUNTER — Ambulatory Visit (HOSPITAL_COMMUNITY)
Admission: RE | Admit: 2010-11-06 | Discharge: 2010-11-06 | Disposition: A | Discharge status: Home / Self Care | Payer: Medicare Other | Source: Ambulatory Visit | Attending: Internal Medicine | Admitting: Internal Medicine

## 2010-11-06 NOTE — Progress Notes (Signed)
Physical Therapy Treatment Patient Name: [name] Date: 11/06/2010  Time in: 8:04am Time out:  8:55 am Visits #:  11 of 12, 69 total Charges:  Deb less than 20cm; high-volt neg estim 30 minutes  SUBJECTIVE: Symptoms/Limitations Symptoms: Pt. reports no pain; wishes to get her wound healed by September vacation. Pain Assessment Currently in Pain?: No/denies Multiple Pain Sites: No  Precautions/Restrictions :  None known        Exercise/Treatments:  Pt received irrigation, excisional debridement, high volt estim and dressing change with hydrogel, 2X2 gauze and 3 inch conform.   Modalities Modalities: Copywriter, advertising Location: L Lat ankle wound Electrical Stimulation Action: Promote blood flow and granulation Electrical Stimulation Parameters: High volt neg polarity, 150 V for 30 minutes; treatment #2 Electrical Stimulation Goals: Wound Weight Bearing Technique Weight Bearing Technique: No  Goals PT Short Term Goals Short Term Goal 1 Progress: Progressing toward goal Short Term Goal 3 Progress: Progressing toward goal Short Term Goal 4 Progress: Progressing toward goal End of Session Patient Active Problem List  Diagnoses  . HYPERLIPIDEMIA  . HYPERTENSION  . ATRIAL FIBRILLATION  . ACUTE ON CHRONIC DIASTOLIC HEART FAILURE  . Occlusion and stenosis of carotid artery without mention of cerebral infarction  . SYNCOPE AND COLLAPSE  . Long term current use of anticoagulant  . Wound of ankle   PT - End of Session Activity Tolerance: Patient tolerated treatment well General Behavior During Session: [facility] for tasks performed Cognition: [facility] for tasks performed PT Assessment and Plan Clinical Impression Statement: Wound continues to heal slowly; Needs re-eval next visit PT Frequency: Min 3X/week PT Duration: 4 weeks PT Plan: Re-eval next visit.  Bascom Levels, Amy B 11/06/2010, 8:49 AM

## 2010-11-07 NOTE — Telephone Encounter (Signed)
Spoke with pt wife, she will keep her current appt [name]

## 2010-11-08 ENCOUNTER — Telehealth: Payer: Self-pay | Admitting: Cardiovascular Disease

## 2010-11-08 ENCOUNTER — Encounter (INDEPENDENT_AMBULATORY_CARE_PROVIDER_SITE_OTHER): Payer: Self-pay | Admitting: Internal Medicine

## 2010-11-08 ENCOUNTER — Telehealth (INDEPENDENT_AMBULATORY_CARE_PROVIDER_SITE_OTHER): Payer: Self-pay | Admitting: *Deleted

## 2010-11-08 ENCOUNTER — Ambulatory Visit (HOSPITAL_COMMUNITY)
Admission: RE | Admit: 2010-11-08 | Discharge: 2010-11-08 | Disposition: A | Discharge status: Home / Self Care | Payer: Medicare Other | Source: Ambulatory Visit | Attending: Internal Medicine | Admitting: Internal Medicine

## 2010-11-08 ENCOUNTER — Ambulatory Visit (INDEPENDENT_AMBULATORY_CARE_PROVIDER_SITE_OTHER): Payer: Medicare Other | Admitting: Internal Medicine

## 2010-11-08 VITALS — BP 120/70 | HR 72 | Temp 97.9°F | Ht 66.0 in | Wt 121.5 lb

## 2010-11-08 DIAGNOSIS — IMO0001 Reserved for inherently not codable concepts without codable children: Secondary | ICD-10-CM | POA: Insufficient documentation

## 2010-11-08 DIAGNOSIS — D649 Anemia, unspecified: Secondary | ICD-10-CM

## 2010-11-08 DIAGNOSIS — R195 Other fecal abnormalities: Secondary | ICD-10-CM

## 2010-11-08 DIAGNOSIS — L97309 Non-pressure chronic ulcer of unspecified ankle with unspecified severity: Secondary | ICD-10-CM | POA: Insufficient documentation

## 2010-11-08 LAB — IRON AND TIBC: Iron: 43 ug/dL (ref 42–145)

## 2010-11-08 LAB — FERRITIN: Ferritin: 51 ng/mL (ref 10–291)

## 2010-11-08 NOTE — Telephone Encounter (Signed)
TCS +/- EGD sch'd @ 10:45 am, Plavix & Warfarin 5 days, ASA 2 days, movi prep inst given

## 2010-11-08 NOTE — Telephone Encounter (Signed)
Pt having colonscopy. Per [employer] [phone]

## 2010-11-08 NOTE — Progress Notes (Addendum)
Physical Therapy Treatment Patient Name: [name] [id number] Date: 11/08/2010 Charges: [name], e-stim Time: [id number] Treatment by Becky Sax, PTA HPI: Symptoms/Limitations Symptoms: No pain.   Pain Assessment Currently in Pain?: No/denies  Exercise/Treatments  Debridement: with forceps, scalpel, irrigation Modalities Modalities: Archivist Stimulation Location: L Lat ankle wound Electrical Stimulation Action: Promote blood flow and granulation  Electrical Stimulation Parameters: High volt neg polarity, 150 V for 30 minutes; treatment #3 Electrical Stimulation Goals: Wound Weight Bearing Technique Weight Bearing Technique: No Dressing: 4x4 w/hydrogel and gauze Goals   End of Session Patient Active Problem List  Diagnoses  . HYPERLIPIDEMIA  . HYPERTENSION  . ATRIAL FIBRILLATION  . ACUTE ON CHRONIC DIASTOLIC HEART FAILURE  . Occlusion and stenosis of carotid artery without mention of cerebral infarction  . SYNCOPE AND COLLAPSE  . Long term current use of anticoagulant  . Wound of ankle   PT - End of Session Activity Tolerance: Patient tolerated treatment well PT Assessment and Plan Clinical Impression Statement: Pt continues to demonstrate increased anxiousness during debridment PT Plan: Cont  [name] 11/08/2010, 9:03 AM

## 2010-11-08 NOTE — Telephone Encounter (Signed)
Left message for [name], question what procedure the pt is having. [name]'s phone number is [phone] and the fax is [phone]. Deliah Goody

## 2010-11-08 NOTE — Telephone Encounter (Signed)
[facility] calling for ok for pt to dc plavix and warfarin for 5 days prior to surgery 11-17-10

## 2010-11-08 NOTE — Progress Notes (Signed)
Subjective:     Patient ID: [name], female   DOB: [birth year], 70 y.o.   MRN: [id number]  HPI  [name] is a 70 yr old white female referred to our office by Dr. Margo Aye for anemia and one positive stool card.  Husband is present in room.  She underwent a CBC in May/2012: H and H 9.9 and 31.3, MCV 90.7.  Appetite good. No weight loss. No dysphagia. No abdominal pain. BM x 1 a day or every other day.  Stools brown in color. No melena or bright red rectal bleeding.  She says she is actually not having any problems.  Last colonoscopy 10 yrs ago in [place] and it was normal.  No polyps.   Review of Systems see hpi.    Objective:   Physical ExamAlert and oriented. Skin warm and dry. Oral mucosa is moist. Natural teeth in good condition. Sclera anicteric, conjunctivae is pink. Thyroid not enlarged. No cervical lymphadenopathy. Lungs clear. Heart regular rate and rhythm.  Abdomen is soft. Bowel sounds are positive. No hepatomegaly. No abdominal masses felt. No tenderness.  Stool guaiac negative.  No edema to lower extremities. Patient is alert and oriented.      Assessment:    anemia, Guaiac positive stools.  Colonic neoplasm, AVM, polyp, needs to be ruled out.  If colonoscopy normal she will need an EGD to rule out upper GI bleed.    Plan:     Will schedule a colonoscopy with Dr Karilyn Cota. IF normal, he will proceed with an EGD.  Will also get iron studies on her today.The risks and benefits such as perforation, bleeding, and infection were reviewed with the patient and is agreeable.

## 2010-11-09 LAB — TRANSFERRIN: Transferrin: 302 mg/dL (ref 200–360)

## 2010-11-10 ENCOUNTER — Ambulatory Visit (HOSPITAL_COMMUNITY)
Admission: RE | Admit: 2010-11-10 | Discharge: 2010-11-10 | Disposition: A | Discharge status: Home / Self Care | Payer: Medicare Other | Source: Ambulatory Visit

## 2010-11-10 NOTE — Progress Notes (Signed)
Physical Therapy Treatment Patient Name: UNNAMED HINO [id number] Date: 11/10/2010  Time In: 8:05 Time Out: 9:08 Visit #: total 71 Next Re-eval: 12/09/2010 Charge: wound debridement < 20 cm estim high volt x 30 min  Subjective: Symptoms/Limitations Symptoms: No pain today, John didnt change my dressings this morning.  Increased slough in dressings. Pain Assessment Currently in Pain?: No/denies  Objective:   Exercise/Treatments  wound care Modalities Modalities: Copywriter, advertising Location: L Lat ankle wound  Electrical Stimulation Action: Promote blood flow and granulation  Electrical Stimulation Parameters: High volt positive polarity, 150 V for 30 minutes; treatment #1 with positive polarity  Goals   End of Session Patient Active Problem List  Diagnoses  . HYPERLIPIDEMIA  . HYPERTENSION  . ATRIAL FIBRILLATION  . ACUTE ON CHRONIC DIASTOLIC HEART FAILURE  . Occlusion and stenosis of carotid artery without mention of cerebral infarction  . SYNCOPE AND COLLAPSE  . Long term current use of anticoagulant  . Wound of ankle   PT - End of Session Activity Tolerance: Patient tolerated treatment well General Behavior During Session: [facility] for tasks performed Cognition: [facility] for tasks performed PT Assessment and Plan Clinical Impression Statement: wound continues to heal slowly. PT Treatment/Interventions: Other (comment) (wound debridement <20 cm with e-stim) PT Plan: Continue with current POC, positive polarity high volt x 2 more sessions.  [name] 11/10/2010, 9:14 AM

## 2010-11-10 NOTE — Telephone Encounter (Signed)
Stroke in 2008 thought due to sublavian stensosi and not afib.  Ok to stop coumadin and plavix

## 2010-11-13 ENCOUNTER — Encounter: Payer: Self-pay | Admitting: *Deleted

## 2010-11-13 ENCOUNTER — Ambulatory Visit (HOSPITAL_COMMUNITY)
Admission: RE | Admit: 2010-11-13 | Discharge: 2010-11-13 | Disposition: A | Discharge status: Home / Self Care | Payer: Medicare Other | Source: Ambulatory Visit | Attending: Internal Medicine | Admitting: Internal Medicine

## 2010-11-13 NOTE — Progress Notes (Signed)
Physical Therapy Treatment Patient Name: [name] [id number] Date: 11/13/2010  Time In: 8:05  Time Out: 9:44 Visit #: total 72 Next Re-eval: 12/09/2010  Charge: wound debridement < 20 cm  estim high volt x 60 min, positive polarity at 150V   HPI: Symptoms/Limitations Symptoms: Pt reports no pain; was a little anxious this morning and had John drive her here.  More crusting at borders and slough upon bandage removal, however filling in and decreasing in size. Pain Assessment Currently in Pain?: No/denies  Precautions/Restrictions:  None noted     End of Session Patient Active Problem List  Diagnoses  . HYPERLIPIDEMIA  . HYPERTENSION  . ATRIAL FIBRILLATION  . ACUTE ON CHRONIC DIASTOLIC HEART FAILURE  . Occlusion and stenosis of carotid artery without mention of cerebral infarction  . SYNCOPE AND COLLAPSE  . Long term current use of anticoagulant  . Wound of ankle   PT - End of Session Activity Tolerance: Patient tolerated treatment well General Behavior During Session: [facility] for tasks performed PT Assessment and Plan Clinical Impression Statement: Wound healing slowly PT Plan: Continue current POC, Positive polarity high volt X 1 more session.  Bascom Levels, Amy B 11/13/2010, 9:47 AM

## 2010-11-13 NOTE — Telephone Encounter (Signed)
Left message for anne of clearance. Letter will be faxed to number provided Deliah Goody

## 2010-11-15 ENCOUNTER — Ambulatory Visit (HOSPITAL_COMMUNITY)
Admission: RE | Admit: 2010-11-15 | Discharge: 2010-11-15 | Disposition: A | Discharge status: Home / Self Care | Payer: Medicare Other | Source: Ambulatory Visit | Attending: Physical Therapy | Admitting: Physical Therapy

## 2010-11-15 ENCOUNTER — Other Ambulatory Visit: Payer: Self-pay | Admitting: Orthopaedic Surgery

## 2010-11-15 DIAGNOSIS — M545 Low back pain, unspecified: Secondary | ICD-10-CM

## 2010-11-15 NOTE — Progress Notes (Signed)
Physical Therapy Treatment Patient Name: [name] Date: 11/15/2010  Time In: 8:10  Time Out: 9:34  Visit #: total 73  Next Re-eval: 12/09/2010  Charge: wound debridement < 20 cm  estim high volt x 60 min, positive polarity at 150V    SUBJECTIVE: Symptoms/Limitations Symptoms: No pain today Pain Assessment Currently in Pain?: No/denies      Goals PT Short Term Goals Short Term Goal 1 Progress: Progressing toward goal Short Term Goal 2 Progress: Met Short Term Goal 3 Progress: Progressing toward goal Short Term Goal 4 Progress: Progressing toward goal End of Session Patient Active Problem List  Diagnoses  . HYPERLIPIDEMIA  . HYPERTENSION  . ATRIAL FIBRILLATION  . ACUTE ON CHRONIC DIASTOLIC HEART FAILURE  . Occlusion and stenosis of carotid artery without mention of cerebral infarction  . SYNCOPE AND COLLAPSE  . Long term current use of anticoagulant  . Wound of ankle   PT - End of Session Activity Tolerance: Patient tolerated treatment well General Behavior During Session: [facility] for tasks performed Cognition: [facility] for tasks performed PT Assessment and Plan Clinical Impression Statement: Wound continues to granulate and fill in PT Treatment/Interventions:  (Woundcare and high volt estim) PT Plan: Continue woundcare, change to negative polarity next session.  Bascom Levels, Amy B 11/15/2010, 9:29 AM

## 2010-11-16 ENCOUNTER — Telehealth: Payer: Self-pay | Admitting: *Deleted

## 2010-11-16 NOTE — Telephone Encounter (Signed)
Left message for daneil, pt given clearance to hold meds for colonoscopy and information was given to ann. i gave them ann's phone number because I did not personally talk to the pt and instruct her to hold her meds. Deliah Goody

## 2010-11-17 ENCOUNTER — Encounter (HOSPITAL_COMMUNITY)
Admission: RE | Disposition: A | Discharge status: Home / Self Care | Payer: Self-pay | Source: Ambulatory Visit | Attending: Internal Medicine

## 2010-11-17 ENCOUNTER — Ambulatory Visit (HOSPITAL_COMMUNITY): Payer: Medicare Other | Admitting: Physical Therapy

## 2010-11-17 ENCOUNTER — Ambulatory Visit (HOSPITAL_COMMUNITY)
Admission: RE | Admit: 2010-11-17 | Discharge: 2010-11-17 | Disposition: A | Discharge status: Home / Self Care | Payer: Medicare Other | Source: Ambulatory Visit | Attending: Internal Medicine | Admitting: Internal Medicine

## 2010-11-17 ENCOUNTER — Encounter: Payer: Self-pay | Admitting: Cardiovascular Disease

## 2010-11-17 DIAGNOSIS — K921 Melena: Secondary | ICD-10-CM | POA: Insufficient documentation

## 2010-11-17 DIAGNOSIS — Z7982 Long term (current) use of aspirin: Secondary | ICD-10-CM | POA: Insufficient documentation

## 2010-11-17 DIAGNOSIS — K5520 Angiodysplasia of colon without hemorrhage: Secondary | ICD-10-CM | POA: Insufficient documentation

## 2010-11-17 DIAGNOSIS — I10 Essential (primary) hypertension: Secondary | ICD-10-CM | POA: Insufficient documentation

## 2010-11-17 DIAGNOSIS — D509 Iron deficiency anemia, unspecified: Secondary | ICD-10-CM

## 2010-11-17 DIAGNOSIS — Z7901 Long term (current) use of anticoagulants: Secondary | ICD-10-CM | POA: Insufficient documentation

## 2010-11-17 DIAGNOSIS — Z79899 Other long term (current) drug therapy: Secondary | ICD-10-CM | POA: Insufficient documentation

## 2010-11-17 DIAGNOSIS — E785 Hyperlipidemia, unspecified: Secondary | ICD-10-CM | POA: Insufficient documentation

## 2010-11-17 HISTORY — PX: COLONOSCOPY: SHX5424

## 2010-11-17 HISTORY — PX: ESOPHAGOGASTRODUODENOSCOPY: SHX5428

## 2010-11-17 SURGERY — COLONOSCOPY
Anesthesia: Moderate Sedation

## 2010-11-17 MED ORDER — FERROUS SULFATE 325 (65 FE) MG PO TBEC: 325.0000 mg | DELAYED_RELEASE_TABLET | Freq: Two times a day (BID) | ORAL | Status: DC

## 2010-11-17 MED ORDER — BUTAMBEN-TETRACAINE-BENZOCAINE 2-2-14 % EX AERO
INHALATION_SPRAY | CUTANEOUS | Status: DC | PRN
  Administered 2010-11-17: 2 via TOPICAL

## 2010-11-17 MED ORDER — MIDAZOLAM HCL 5 MG/5ML IJ SOLN
INTRAMUSCULAR | Status: AC
  Filled 2010-11-17: qty 10

## 2010-11-17 MED ORDER — SODIUM CHLORIDE 0.45 % IV SOLN
Freq: Once | INTRAVENOUS | Status: AC
  Administered 2010-11-17: 10:00:00 via INTRAVENOUS

## 2010-11-17 MED ORDER — STERILE WATER FOR IRRIGATION IR SOLN
Status: DC | PRN
  Administered 2010-11-17: 12:00:00

## 2010-11-17 MED ORDER — MEPERIDINE HCL 50 MG/ML IJ SOLN
INTRAMUSCULAR | Status: AC
  Filled 2010-11-17: qty 1

## 2010-11-17 MED ORDER — MEPERIDINE HCL 25 MG/ML IJ SOLN
INTRAMUSCULAR | Status: DC | PRN
  Administered 2010-11-17 (×2): 25 mg via INTRAVENOUS

## 2010-11-17 MED ORDER — MIDAZOLAM HCL 5 MG/5ML IJ SOLN
INTRAMUSCULAR | Status: DC | PRN
  Administered 2010-11-17 (×5): 2 mg via INTRAVENOUS

## 2010-11-17 NOTE — Telephone Encounter (Signed)
Per Duwayne Heck from [facility], they need the ok for the patient to continue to hold plavix until Monday for epidural injection. The patient underwent colonoscopy today and had been given the ok from Dr. Eden Emms to hold coumadin and plavix prior to procedure. Per Duwayne Heck, they have an ok from Dr. Eden Emms to continue to hold coumadin prior to epidural injection. I have reviewed this with Dr. Myrtis Ser and he has given the ok for the patient to hold plavix until Monday. Duwayne Heck is aware.

## 2010-11-17 NOTE — H&P (Signed)
[name] is an 70 y.o. female.   Chief Complaint: For EGD and colonoscopy; patient has anemia and heme-positive stool. HPI: Patient is 70 year old female who was recently found to have hemoglobin of less than 10 g. Her stool is heme positive. There is no history of melena or rectal bleeding She denies abdominal pain heartburn or dysphagia. Her appetite is good no recent weight loss. Patient has been on Coumadin aspirin and Plavix and also takes Fosamax. Her last colonoscopy was 10 years ago. There is no history of peptic ulcer disease.  Past Medical History  Diagnosis Date  . Subclavian steal syndrome   . Carotid bruit     LICA 60-79% (duplex 9/11)  . Hypertension   . Hyperlipidemia   . Atrial fibrillation   . Anticoagulation management encounter   . Breast cancer     S/P left mastectomy  . Meningioma     Past Surgical History  Procedure Date  . Cardiac catheterization   . Mastectomy     left in 1990  . Cataract surgery     recently    No family history on file. Social History:  reports that she has quit smoking. Her smoking use included Cigarettes. She does not have any smokeless tobacco history on file. She reports that she does not drink alcohol or use illicit drugs.  Allergies:  Allergies  Allergen Reactions  . Iodine     REACTION: rash/burning  . Iohexol      Desc: PT NEEDS PRE.MEDS     Medications Prior to Admission  Medication Dose Route Frequency Provider Last Rate Last Dose  . 0.45 % sodium chloride infusion   Intravenous Once Malissa Hippo, MD 20 mL/hr at 11/17/10 1028    . meperidine (DEMEROL) 50 MG/ML injection           . midazolam (VERSED) 5 MG/5ML injection            Medications Prior to Admission  Medication Sig Dispense Refill  . alendronate (FOSAMAX) 70 MG tablet Take 70 mg by mouth every 7 (seven) days. Take in the morning with a full glass of water, on an empty stomach, and do not take anything else by mouth or lie down for the next 30  min.       Marland Kitchen amiodarone (PACERONE) 200 MG tablet Take 1 tablet (200 mg total) by mouth 2 (two) times daily.  60 tablet  1  . amLODipine (NORVASC) 5 MG tablet Take 5 mg by mouth daily.        Marland Kitchen ascorbic acid (VITAMIN C) 500 MG tablet Take 500 mg by mouth every morning.        . calcium-vitamin D (OSCAL) 250-125 MG-UNIT per tablet Take 1 tablet by mouth 2 (two) times daily.        . colesevelam (WELCHOL) 625 MG tablet Take 1,875 mg by mouth 3 (three) times daily.       . fish oil-omega-3 fatty acids 1000 MG capsule Take by mouth daily.        Marland Kitchen HYDROcodone-acetaminophen (NORCO) 5-325 MG per tablet Take 1 tablet by mouth every 6 (six) hours as needed.        . meloxicam (MOBIC) 15 MG tablet Take 15 mg by mouth every morning.        . metoprolol tartrate (LOPRESSOR) 25 MG tablet Take 1 tablet (25 mg total) by mouth 2 (two) times daily. Take 3 tablets twice a day.  60 tablet  1  .  Multiple Vitamin (MULTIVITAMIN) capsule Take 1 capsule by mouth daily.        . pravastatin (PRAVACHOL) 40 MG tablet Take 40 mg by mouth at bedtime.        . valsartan (DIOVAN) 320 MG tablet Take 320 mg by mouth daily.       Marland Kitchen aspirin 81 MG tablet Take 81 mg by mouth every morning.        . clopidogrel (PLAVIX) 75 MG tablet Take 75 mg by mouth every morning.        . warfarin (COUMADIN) 4 MG tablet Take 4 mg by mouth daily. Coumadin 4mg  on Sund, Tuesday, Thursday., 2mg  on Monday, Wednesday, Friday and Saturday        No results found for this or any previous visit (from the past 48 hour(s)). No results found.  Review of Systems  Constitutional: Negative for fever and weight loss.  Gastrointestinal: Negative for heartburn, nausea, vomiting, abdominal pain, diarrhea, constipation, blood in stool and melena.    Blood pressure 155/73, pulse 70, temperature 97.6 F (36.4 C), temperature source Oral, resp. rate 22, height 5\' 6"  (1.676 m), weight 121 lb (54.885 kg), SpO2 100.00%. Physical Exam  Constitutional: She appears  well-developed.       Thin female  HENT:  Mouth/Throat: Oropharynx is clear and moist.  Eyes: Conjunctivae are normal. No scleral icterus.  Neck: Neck supple. No thyromegaly present.  Cardiovascular: Normal rate, regular rhythm and normal heart sounds.   No murmur heard. Respiratory: Breath sounds normal.  GI: Soft. She exhibits no distension and no mass. There is no tenderness.  Musculoskeletal: She exhibits no edema.  Lymphadenopathy:    She has no cervical adenopathy.  Neurological: She is alert.  Skin: Skin is warm and dry.     Assessment/Plan Anemia and heme positive stools. EGD and colonoscopy.  Latham Kinzler U 11/17/2010, 12:19 PM

## 2010-11-17 NOTE — Op Note (Signed)
EGD and COLONOSCOPY  REPORT  PATIENT:  [name]  MR#:  [id number] Birthdate:  09/26/1940, 70 y.o., female Endoscopist:  Dr. Malissa Hippo, MD Referred By:  Dr. Dwana Melena MD Procedure Date: 11/17/2010  Procedure:   EGD & Colonoscopy  Indications:  Anemia and heme positive stools. Iron studies are suggestive of iron deficiency anemia although serum ferritin is low normal (serum iron 43, TIBC 392, saturation 11% and ferritin is 51). While patient does not have any  GI symptoms she is on low-dose ASA, Plavix, Coumadin and Fosamax. Patient's loss colonoscopy was 10 years ago.            Informed Consent:  The risks, benefits, alternatives & imponderables which include, but are not limited to, bleeding, infection, perforation, drug reaction and potential missed lesion have been reviewed.  The potential for biopsy, lesion removal, esophageal dilation, etc. have also been discussed.  Questions have been answered.  All parties agreeable.  Please see history & physical in medical record for more information.  Medications:  Demerol 50  mg IV Versed 10 mg IV Cetacaine spray topically for oropharyngeal anesthesia  EGD  Description of procedure:  The endoscope was introduced through the mouth and advanced to the second portion of the duodenum without difficulty or limitations. The mucosal surfaces were surveyed very carefully during advancement of the scope and upon withdrawal.  Findings:  Esophagus:  Normal mucosa. GEJ:  39 cm.  Stomach:  Normal distensibility; folds in the proximal stomach were normal;  mucosa at body, antrum, pyloric channel as well as angularis, fundus and cardia was normal Duodenum:  Normal all bulbar and post bulbar mucosa.  Therapeutic/Diagnostic Maneuvers Performed:  None  COLONOSCOPY Description of procedure:  After a digital rectal exam was performed, that colonoscope was advanced from the anus through the rectum and colon to the area of the cecum, ileocecal valve  and appendiceal orifice. The cecum was deeply intubated. These structures were well-seen and photographed for the record. From the level of the cecum and ileocecal valve, the scope was slowly and cautiously withdrawn. The mucosal surfaces were carefully surveyed utilizing scope tip to flexion to facilitate fold flattening as needed. The scope was pulled down into the rectum where a thorough exam including retroflexion was performed.  Findings:   Prep excellent. Single small AV malformation at hepatic flexure; no other abnormalities noted. Thickened anoderm.  Therapeutic/Diagnostic Maneuvers Performed:  None  Complications:  None.  Cecal Withdrawal Time:  6 minutes  Impression:  Normal esophagogastroduodenoscopy. Colonoscopy except single small AVM at hepatic flexure.  Recommendations:  Resume usual medications as before. Ferrous sulfate 25 mg twice daily. CBC in one month(office will call) Whether or not she will need given capsule study will depend on her clinical course.  Female Minish U  11/17/2010 1:21 PM  CC: Dr. Dwana Melena, MD

## 2010-11-17 NOTE — Telephone Encounter (Signed)
Error

## 2010-11-20 ENCOUNTER — Telehealth (INDEPENDENT_AMBULATORY_CARE_PROVIDER_SITE_OTHER): Payer: Self-pay | Admitting: *Deleted

## 2010-11-20 ENCOUNTER — Ambulatory Visit
Admission: RE | Admit: 2010-11-20 | Discharge: 2010-11-20 | Disposition: A | Discharge status: Home / Self Care | Payer: Medicare Other | Source: Ambulatory Visit | Attending: Orthopaedic Surgery | Admitting: Orthopaedic Surgery

## 2010-11-20 ENCOUNTER — Telehealth (INDEPENDENT_AMBULATORY_CARE_PROVIDER_SITE_OTHER): Payer: Self-pay | Admitting: Internal Medicine

## 2010-11-20 DIAGNOSIS — D649 Anemia, unspecified: Secondary | ICD-10-CM

## 2010-11-20 DIAGNOSIS — M545 Low back pain: Secondary | ICD-10-CM

## 2010-11-20 NOTE — Progress Notes (Signed)
Patient rescheduled from today to Friday, 11-24-10, for LEPI due to need for 13-hour prep and because patient took Coumadin last evening.  Dr. Karin Golden in to speak with patient and her husband about contrast allergy.  Script scanned then given to patient for 13-hour prep, with written instructions as to when to take the Prednisone and Benadryl doses.  Patient also knows to remain off Plavix and Coumadin until after LEPI.  She knows to get INR checked early Friday morning and to be here thirty minutes early for Valium.  Donell Sievert, RN

## 2010-11-20 NOTE — Telephone Encounter (Signed)
Results given to husband. 

## 2010-11-20 NOTE — Telephone Encounter (Signed)
Per Dr. Karilyn Cota the Patient will need a cbc/d in 1 month

## 2010-11-21 ENCOUNTER — Ambulatory Visit (HOSPITAL_COMMUNITY)
Admission: RE | Admit: 2010-11-21 | Discharge: 2010-11-21 | Disposition: A | Discharge status: Home / Self Care | Payer: Medicare Other | Source: Ambulatory Visit | Attending: Internal Medicine | Admitting: Internal Medicine

## 2010-11-21 NOTE — Progress Notes (Signed)
Physical Therapy Treatment Patient Name: [name] Date: 11/21/2010  Time In: 2:05pm  Time Out:  3:54pm  Visit #: total 74  Next Re-eval: 12/09/2010  Charge: wound debridement < 20 cm  estim high volt x 60 min, negative polarity at 150V (first RX)  SUBJECTIVE: Symptoms/Limitations Symptoms: Pt. reports she is not in any pain today. Pain Assessment Currently in Pain?: No/denies  Precautions/Restrictions:  None noted   OBJECTIVE:  See wound doc sheets.  Overall decreasing slough and wound borders approximating.  88% granulated.  Wound irrigated, debrided and dressed following high volt estim.  Wound dressed with hydrogel, 2X2's and 3" conform.     Modalities Modalities: Copywriter, advertising Location: L lat ankle wound Electrical Stimulation Action: to promote blood flow and granulation Electrical Stimulation Parameters: High Volt, negative polarity, 150V for 60 minutes, treatment #1 with negative polarity Electrical Stimulation Goals: Wound End of Session Patient Active Problem List  Diagnoses  . HYPERLIPIDEMIA  . HYPERTENSION  . ATRIAL FIBRILLATION  . ACUTE ON CHRONIC DIASTOLIC HEART FAILURE  . Occlusion and stenosis of carotid artery without mention of cerebral infarction  . SYNCOPE AND COLLAPSE  . Long term current use of anticoagulant  . Wound of ankle   PT - End of Session Activity Tolerance: Patient tolerated treatment well General Behavior During Session: [facility] for tasks performed Cognition: [facility] for tasks performed PT Assessment and Plan Clinical Impression Statement: Overall improving in size and depth. PT Treatment/Interventions:  Gladis Riffle) PT Plan: Continue Woundcare, changed to negative polarity; to continue 2 more sessions with negative polarity.  Bascom Levels, Eloisa Chokshi B 11/21/2010, 6:06 PM

## 2010-11-22 ENCOUNTER — Encounter: Payer: Medicare Other | Admitting: *Deleted

## 2010-11-23 ENCOUNTER — Ambulatory Visit (HOSPITAL_COMMUNITY)
Admission: RE | Admit: 2010-11-23 | Discharge: 2010-11-23 | Disposition: A | Discharge status: Home / Self Care | Payer: Medicare Other | Source: Ambulatory Visit | Attending: Internal Medicine | Admitting: Internal Medicine

## 2010-11-23 ENCOUNTER — Ambulatory Visit (INDEPENDENT_AMBULATORY_CARE_PROVIDER_SITE_OTHER): Payer: Medicare Other | Admitting: *Deleted

## 2010-11-23 ENCOUNTER — Encounter (HOSPITAL_COMMUNITY): Payer: Self-pay | Admitting: Internal Medicine

## 2010-11-23 DIAGNOSIS — Z7901 Long term (current) use of anticoagulants: Secondary | ICD-10-CM

## 2010-11-23 DIAGNOSIS — I4891 Unspecified atrial fibrillation: Secondary | ICD-10-CM

## 2010-11-23 LAB — POCT INR: INR: 1.1

## 2010-11-23 NOTE — Progress Notes (Addendum)
Physical Therapy - Wound Therapy  Patient Details  Name: [name] MRN: [id number] Date of Birth: 1940/08/06  Today's Date: 11/23/2010 Time: 4098-1191 Time Calculation (min): 87 min Visit#: 75 Re-eval: 12/09/10 Charges: deb < 20 cm, unattended e-stim Wound Therapy Wound 05/04/10 Abrasion(s);Other (Comment) Ankle Left;Lateral scratch that turned into ulcer; 2.0cmX1cmX0.25cm with 95% slough and 5% granulation (Active)  Site / Wound Assessment Clean;Dry 11/21/2010  5:00 PM  % Wound base Black 0% 11/03/2010  8:00 AM  Peri-wound Assessment Erythema (blanchable);Bleeding 11/06/2010  8:00 AM  Wound Length (cm) 3.4 cm 11/23/2010  8:00 AM  Wound Width (cm) 1.2 cm 11/23/2010  8:00 AM  Wound Depth (cm) 0.1 cm 11/23/2010  8:00 AM  Margins Other (Comment) 10/23/2010  8:00 AM  Drainage Amount Minimal 11/23/2010  8:00 AM  Drainage Description Sanguineous 11/23/2010  8:00 AM  Non-staged Wound Description Not applicable 11/08/2010  8:00 AM  Treatment Cleansed 11/23/2010  8:00 AM  Dressing Type Hydrogel 11/23/2010  8:00 AM  Dressing Changed New 11/10/2010  8:00 AM  Dressing Status Clean;Intact 11/23/2010  8:00 AM   Electrical Stimulation: High Volt, negative polarity, 150V for 60 minutes, treatment #2 with negative polarity    Physical Therapy Assessment and Plan Wound Therapy - Assess/Plan/Recommendations Wound Therapy - Clinical Statement: continues to heal, decrease in size Wound Therapy - Functional Problem List: Vascular compromise Factors Delaying/Impairing Wound Healing: Vascular compromise Hydrotherapy Plan: Debridement;Dressing change;Electrical stimulation PT Assessment and Plan Clinical Impression Statement: improving width and granulation    Goals PT Short Term Goals PT Short Term Goal 1: Necrotic tissue to be 0%, granulation to be 100%  PT Short Term Goal 1 - Progress: Progressing toward goal PT Short Term Goal 2: Pt/family will be independent with dressing changes/self-care PT Short Term  Goal 2 - Progress: Met PT Short Term Goal 3: Decreased wound size by 1.5cm X 0.5cm X 0.25cm  PT Short Term Goal 3 - Progress: Progressing toward goal PT Short Term Goal 4: Decreased drainage equal or less than scant  PT Short Term Goal 4 - Progress: Progressing toward goal  Problem List Patient Active Problem List  Diagnoses  . HYPERLIPIDEMIA  . HYPERTENSION  . ATRIAL FIBRILLATION  . ACUTE ON CHRONIC DIASTOLIC HEART FAILURE  . Occlusion and stenosis of carotid artery without mention of cerebral infarction  . SYNCOPE AND COLLAPSE  . Long term current use of anticoagulant  . Wound of ankle    Shade Kaley 11/23/2010, 10:01 AM

## 2010-11-24 ENCOUNTER — Ambulatory Visit
Admission: RE | Admit: 2010-11-24 | Discharge: 2010-11-24 | Disposition: A | Discharge status: Home / Self Care | Payer: Medicare Other | Source: Ambulatory Visit | Attending: Orthopaedic Surgery | Admitting: Orthopaedic Surgery

## 2010-11-24 ENCOUNTER — Ambulatory Visit (HOSPITAL_COMMUNITY): Payer: Medicare Other | Admitting: *Deleted

## 2010-11-24 ENCOUNTER — Other Ambulatory Visit: Payer: Self-pay | Admitting: Orthopaedic Surgery

## 2010-11-24 ENCOUNTER — Inpatient Hospital Stay: Admission: RE | Admit: 2010-11-24 | Payer: Medicare Other | Source: Ambulatory Visit

## 2010-11-24 DIAGNOSIS — M545 Low back pain: Secondary | ICD-10-CM

## 2010-11-24 DIAGNOSIS — M79606 Pain in leg, unspecified: Secondary | ICD-10-CM

## 2010-11-24 MED ORDER — DIAZEPAM 2 MG PO TABS
5.0000 mg | ORAL_TABLET | Freq: Once | ORAL | Status: AC
  Administered 2010-11-24: 5 mg via ORAL

## 2010-11-24 MED ORDER — IOHEXOL 180 MG/ML  SOLN
1.0000 mL | Freq: Once | INTRAMUSCULAR | Status: AC | PRN
  Administered 2010-11-24: 1 mL via EPIDURAL

## 2010-11-24 MED ORDER — METHYLPREDNISOLONE ACETATE 40 MG/ML INJ SUSP (RADIOLOG
120.0000 mg | Freq: Once | INTRAMUSCULAR | Status: AC
  Administered 2010-11-24: 120 mg via EPIDURAL

## 2010-11-24 NOTE — Progress Notes (Signed)
13-hr prep phoned in to Ada at [facility] for next visit 12/08/10.  Doses and times reviewed and written info given to pt's husband.  Instructions written and reviewed for stopping Plavix and Coumadin prior to next injection.  Pt also knows to arrive early next visit for Valium.  jkl

## 2010-11-24 NOTE — Progress Notes (Signed)
Pt took 13-hr prep as prescribed.  INR 1.1 as of 8/16 at 1550.   jkl

## 2010-11-27 ENCOUNTER — Encounter: Payer: Self-pay | Admitting: Cardiovascular Disease

## 2010-11-27 ENCOUNTER — Ambulatory Visit (HOSPITAL_COMMUNITY)
Admission: RE | Admit: 2010-11-27 | Discharge: 2010-11-27 | Disposition: A | Discharge status: Home / Self Care | Payer: Medicare Other | Source: Ambulatory Visit | Attending: Internal Medicine | Admitting: Internal Medicine

## 2010-11-27 NOTE — Progress Notes (Signed)
Physical Therapy - Wound Therapy  Patient Details  Name: [name] MRN: [id number] Date of Birth: 1940-12-09  Today's Date: 11/27/2010 Time: 4098-1191 Time Calculation (min): 95 min Visit#: 76 Re-eval: 12/08/10 Charges:  debrid <20cm, estim unattnd  Wound Therapy Wound 05/04/10 Abrasion(s);Other (Comment) Ankle Left;Lateral scratch that turned into ulcer; 2.0cmX1cmX0.25cm with 95% slough and 5% granulation (Active)  Site / Wound Assessment Clean;Dry 11/27/2010  9:00 AM  % Wound base Black 0% 11/03/2010  8:00 AM  Peri-wound Assessment Erythema (blanchable);Bleeding 11/06/2010  8:00 AM  Wound Length (cm) 3.4 cm 11/23/2010  8:00 AM  Wound Width (cm) 1.2 cm 11/23/2010  8:00 AM  Wound Depth (cm) 0.1 cm 11/23/2010  8:00 AM  Margins Other (Comment) 10/23/2010  8:00 AM  Drainage Amount Minimal 11/27/2010  9:00 AM  Drainage Description Sanguineous 11/27/2010  9:00 AM  Non-staged Wound Description Not applicable 11/08/2010  8:00 AM  Treatment Cleansed 11/27/2010  9:00 AM  Dressing Type Hydrogel 11/27/2010  9:00 AM  Dressing Changed New 11/10/2010  8:00 AM  Dressing Status Clean;Intact 11/27/2010  9:00 AM       Physical Therapy Assessment and Plan Wound Therapy - Assess/Plan/Recommendations Wound Therapy - Clinical Statement: Overall decreasing in size and increasing in granulation. Wound Therapy - Functional Problem List: Vascular compromise Factors Delaying/Impairing Wound Healing: Vascular compromise Hydrotherapy Plan: Debridement;Dressing change;Electrical stimulation PT Assessment and Plan Clinical Impression Statement: Wound with increased approximation and granulation.   PT Treatment/Interventions:  (Woundcare and high-volt estim (negative polarity)) PT Plan: Change to positive polarity next session.      Problem List Patient Active Problem List  Diagnoses  . HYPERLIPIDEMIA  . HYPERTENSION  . ATRIAL FIBRILLATION  . ACUTE ON CHRONIC DIASTOLIC HEART FAILURE  . Occlusion and stenosis of  carotid artery without mention of cerebral infarction  . SYNCOPE AND COLLAPSE  . Long term current use of anticoagulant  . Wound of ankle    Lurena Nida 11/27/2010, 9:38 AM

## 2010-11-29 ENCOUNTER — Ambulatory Visit (HOSPITAL_COMMUNITY)
Admission: RE | Admit: 2010-11-29 | Discharge: 2010-11-29 | Disposition: A | Discharge status: Home / Self Care | Payer: Medicare Other | Source: Ambulatory Visit

## 2010-11-29 NOTE — Progress Notes (Signed)
Physical Therapy - Wound Therapy  Patient Details  Name: [name] MRN: [id number] Date of Birth: [birth year]  Today's Date: 11/29/2010 Time: 9147-8295 Time Calculation (min): 100 min Visit#: 77 *Re-eval: 12/08/10  Wound Therapy Wound 05/04/10 Abrasion(s);Other (Comment) Ankle Left;Lateral scratch that turned into ulcer; 2.0cmX1cmX0.25cm with 95% slough and 5% granulation (Active)  Site / Wound Assessment Clean;Dry 11/29/2010  8:00 AM  % Wound base Black 0% 11/03/2010  8:00 AM  Peri-wound Assessment Erythema (blanchable);Bleeding 11/06/2010  8:00 AM  Wound Length (cm) 3.4 cm 11/23/2010  8:00 AM  Wound Width (cm) 1.2 cm 11/23/2010  8:00 AM  Wound Depth (cm) 0.1 cm 11/23/2010  8:00 AM  Margins Other (Comment) 10/23/2010  8:00 AM  Drainage Amount Minimal 11/29/2010  8:00 AM  Drainage Description Sanguineous 11/29/2010  8:00 AM  Non-staged Wound Description Not applicable 11/08/2010  8:00 AM  Treatment Cleansed 11/29/2010  8:00 AM  Dressing Type Hydrogel 11/29/2010  8:00 AM  Dressing Changed New 11/10/2010  8:00 AM  Dressing Status Clean;Intact 11/29/2010  8:00 AM       Physical Therapy Assessment and Plan   PT Assessment and Plan Clinical Impression Statement: Wound with increased approximation and granulation PT Plan: Continue high volt positive polarity x 2 more sessions.    Goals    Problem List Patient Active Problem List  Diagnoses  . HYPERLIPIDEMIA  . HYPERTENSION  . ATRIAL FIBRILLATION  . ACUTE ON CHRONIC DIASTOLIC HEART FAILURE  . Occlusion and stenosis of carotid artery without mention of cerebral infarction  . SYNCOPE AND COLLAPSE  . Long term current use of anticoagulant  . Wound of ankle    [name] 11/29/2010, 12:44 PM

## 2010-12-01 ENCOUNTER — Ambulatory Visit (HOSPITAL_COMMUNITY)
Admission: RE | Admit: 2010-12-01 | Discharge: 2010-12-01 | Disposition: A | Discharge status: Home / Self Care | Payer: Medicare Other | Source: Ambulatory Visit | Attending: Internal Medicine | Admitting: Internal Medicine

## 2010-12-01 NOTE — Progress Notes (Signed)
Physical Therapy - Wound Therapy  Patient Details  Name: [name] MRN: [id number] Date of Birth: July 26, 1940  Today's Date: 12/01/2010 Time: 0800-0917 Time Calculation (min): 77 min Visit#: 78 Re-eval: 12/08/10 Charges: 1 deb >20cm, 1 e-stim Wound Therapy Wound 05/04/10 Abrasion(s);Other (Comment) Ankle Left;Lateral scratch that turned into ulcer; 2.0cmX1cmX0.25cm with 95% slough and 5% granulation (Active)  Site / Wound Assessment Clean;Dry 12/01/2010  8:00 AM  % Wound base Black 0% 11/03/2010  8:00 AM  Peri-wound Assessment Erythema (blanchable);Bleeding 11/06/2010  8:00 AM  Wound Length (cm) 2.3 cm 12/01/2010  8:00 AM  Wound Width (cm) 1 cm 12/01/2010  8:00 AM  Wound Depth (cm) 0.1 cm 12/01/2010  8:00 AM  Margins Other (Comment) 10/23/2010  8:00 AM  Drainage Amount Minimal 12/01/2010  8:00 AM  Drainage Description Sanguineous 12/01/2010  8:00 AM  Non-staged Wound Description Not applicable 11/08/2010  8:00 AM  Treatment Cleansed 12/01/2010  8:00 AM  Dressing Type Hydrogel 12/01/2010  8:00 AM  Dressing Changed New 11/10/2010  8:00 AM  Dressing Status Clean;Intact 12/01/2010  8:00 AM     Granulation: 90%, Slough 10%  Physical Therapy Assessment and Plan Wound Therapy - Assess/Plan/Recommendations Wound Therapy - Clinical Statement: Overall decreasing in size and increasing in granulation. Wound Therapy - Functional Problem List: Vascular compromise Factors Delaying/Impairing Wound Healing: Vascular compromise Hydrotherapy Plan: Debridement;Dressing change;Electrical stimulation PT Assessment and Plan Clinical Impression Statement: Pt continues to have significant decrease in wound length and width and has improved vascularity noted with debridment. PT Plan: Cont with high volt POSITIVE polarity.    Goals    Problem List Patient Active Problem List  Diagnoses  . HYPERLIPIDEMIA  . HYPERTENSION  . ATRIAL FIBRILLATION  . ACUTE ON CHRONIC DIASTOLIC HEART FAILURE  . Occlusion and  stenosis of carotid artery without mention of cerebral infarction  . SYNCOPE AND COLLAPSE  . Long term current use of anticoagulant  . Wound of ankle    [name] 12/01/2010, 8:34 AM

## 2010-12-04 ENCOUNTER — Encounter: Payer: Medicare Other | Admitting: *Deleted

## 2010-12-04 ENCOUNTER — Ambulatory Visit (HOSPITAL_COMMUNITY)
Admission: RE | Admit: 2010-12-04 | Discharge: 2010-12-04 | Disposition: A | Discharge status: Home / Self Care | Payer: Medicare Other | Source: Ambulatory Visit | Attending: Internal Medicine | Admitting: Internal Medicine

## 2010-12-04 NOTE — Progress Notes (Signed)
Physical Therapy - Wound Therapy  Patient Details  Name: [name] MRN: [id number] Date of Birth: 22-Dec-1940  Today's Date: 12/04/2010 Time: 0802-0927 Time Calculation (min): 85 min Visit#: 79 Re-eval: 12/08/10 Charges: 1 deb >20cm, 1 e-stim  Wound Therapy Wound 05/04/10 Abrasion(s);Other (Comment) Ankle Left;Lateral scratch that turned into ulcer; 2.0cmX1cmX0.25cm with 95% slough and 5% granulation (Active)  Site / Wound Assessment Clean;Dry 12/04/2010  8:00 AM  % Wound base Black 0% 11/03/2010  8:00 AM  Peri-wound Assessment Erythema (blanchable);Bleeding 11/06/2010  8:00 AM  Wound Length (cm) 2.3 cm 12/01/2010  8:00 AM  Wound Width (cm) 1 cm 12/01/2010  8:00 AM  Wound Depth (cm) 0.1 cm 12/01/2010  8:00 AM  Margins Other (Comment) 10/23/2010  8:00 AM  Drainage Amount Minimal 12/04/2010  8:00 AM  Drainage Description Sanguineous 12/04/2010  8:00 AM  Non-staged Wound Description Not applicable 11/08/2010  8:00 AM  Treatment Cleansed 12/04/2010  8:00 AM  Dressing Type Hydrogel 12/04/2010  8:00 AM  Dressing Changed New 11/10/2010  8:00 AM  Dressing Status Clean;Intact 12/04/2010  8:00 AM   Slough 10% Granulation 90%  Physical Therapy Assessment and Plan Wound Therapy - Assess/Plan/Recommendations Wound Therapy - Clinical Statement: Overall decreasing in size and increasing in granulation. Wound Therapy - Functional Problem List: Vascular compromise Factors Delaying/Impairing Wound Healing: Vascular compromise Hydrotherapy Plan: Debridement;Dressing change;Electrical stimulation PT Assessment and Plan Clinical Impression Statement: Pt presents with decreased wound width. Pt with decreased dry skin around periwound. PT Treatment/Interventions: Other (comment) (Woundcare and high-volt estim (positive polarity)) PT Plan: Continue with positive polarity #2 next tx.    Goals PT Short Term Goals PT Short Term Goal 1: Necrotic tissue to be 0%, granulation to be 100%  PT Short Term Goal 1 -  Progress: Progressing toward goal PT Short Term Goal 2: Pt/family will be independent with dressing changes/self-care PT Short Term Goal 2 - Progress: Met PT Short Term Goal 3: Decreased wound size by 1.5cm X 0.5cm X 0.25cm  PT Short Term Goal 3 - Progress: Progressing toward goal PT Short Term Goal 4: Decreased drainage equal or less than scant  PT Short Term Goal 4 - Progress: Progressing toward goal  Problem List Patient Active Problem List  Diagnoses  . HYPERLIPIDEMIA  . HYPERTENSION  . ATRIAL FIBRILLATION  . ACUTE ON CHRONIC DIASTOLIC HEART FAILURE  . Occlusion and stenosis of carotid artery without mention of cerebral infarction  . SYNCOPE AND COLLAPSE  . Long term current use of anticoagulant  . Wound of ankle    [name] 12/04/2010, 8:37 AM

## 2010-12-06 ENCOUNTER — Ambulatory Visit (HOSPITAL_COMMUNITY): Payer: Medicare Other | Admitting: *Deleted

## 2010-12-07 ENCOUNTER — Ambulatory Visit (HOSPITAL_COMMUNITY)
Admission: RE | Admit: 2010-12-07 | Discharge: 2010-12-07 | Disposition: A | Discharge status: Home / Self Care | Payer: Medicare Other | Source: Ambulatory Visit

## 2010-12-07 ENCOUNTER — Ambulatory Visit (INDEPENDENT_AMBULATORY_CARE_PROVIDER_SITE_OTHER): Payer: Medicare Other | Admitting: *Deleted

## 2010-12-07 DIAGNOSIS — Z7901 Long term (current) use of anticoagulants: Secondary | ICD-10-CM

## 2010-12-07 DIAGNOSIS — I4891 Unspecified atrial fibrillation: Secondary | ICD-10-CM

## 2010-12-07 NOTE — Progress Notes (Signed)
Physical Therapy - Wound Therapy  Patient Details  Name: [name] MRN: [id number] Date of Birth: 1941/01/01  Today's Date: 12/07/2010 Time: 0802-0935 Time Calculation (min): 93 min Visit#: 80 Re-eval: 12/08/10 next tx Charge: Selective debridement < 20 cm estim unattended 1 unit  Wound Therapy Wound 05/04/10 Abrasion(s);Other (Comment) Ankle Left;Lateral scratch that turned into ulcer; 2.0cmX1cmX0.25cm with 95% slough and 5% granulation (Active)  Site / Wound Assessment Clean;Dry 12/07/2010  8:00 AM  % Wound base Black 0% 11/03/2010  8:00 AM  Peri-wound Assessment Erythema (blanchable);Bleeding 11/06/2010  8:00 AM  Wound Length (cm) 2.3 cm 12/01/2010  8:00 AM  Wound Width (cm) 1 cm 12/01/2010  8:00 AM  Wound Depth (cm) 0.1 cm 12/01/2010  8:00 AM  Margins Other (Comment) 10/23/2010  8:00 AM  Drainage Amount Minimal 12/07/2010  8:00 AM  Drainage Description Sanguineous 12/07/2010  8:00 AM  Non-staged Wound Description Not applicable 11/08/2010  8:00 AM  Treatment Cleansed 12/07/2010  8:00 AM  Dressing Type Hydrogel 12/07/2010  8:00 AM  Dressing Changed New 11/10/2010  8:00 AM  Dressing Status Clean;Intact 12/07/2010  8:00 AM       Physical Therapy Assessment and Plan Wound Therapy - Assess/Plan/Recommendations Wound Therapy - Clinical Statement: Overall decreasing in size and increasing in granulation. Wound Therapy - Functional Problem List: Vascular compromise Factors Delaying/Impairing Wound Healing: Vascular compromise Hydrotherapy Plan: Debridement;Dressing change;Electrical stimulation PT Assessment and Plan Clinical Impression Statement: Pt with more superficial wound and decreased wound width.   PT Plan: Continue with negative polarity, reassess next session.    Goals    Problem List Patient Active Problem List  Diagnoses  . HYPERLIPIDEMIA  . HYPERTENSION  . ATRIAL FIBRILLATION  . ACUTE ON CHRONIC DIASTOLIC HEART FAILURE  . Occlusion and stenosis of carotid artery without  mention of cerebral infarction  . SYNCOPE AND COLLAPSE  . Long term current use of anticoagulant  . Wound of ankle    [name] 12/07/2010, 11:23 AM

## 2010-12-08 ENCOUNTER — Ambulatory Visit
Admission: RE | Admit: 2010-12-08 | Discharge: 2010-12-08 | Disposition: A | Discharge status: Home / Self Care | Payer: Medicare Other | Source: Ambulatory Visit | Attending: Orthopaedic Surgery | Admitting: Orthopaedic Surgery

## 2010-12-08 ENCOUNTER — Inpatient Hospital Stay (HOSPITAL_COMMUNITY): Admission: RE | Admit: 2010-12-08 | Payer: Medicare Other | Source: Ambulatory Visit

## 2010-12-08 DIAGNOSIS — M545 Low back pain: Secondary | ICD-10-CM

## 2010-12-08 MED ORDER — METHYLPREDNISOLONE ACETATE 40 MG/ML INJ SUSP (RADIOLOG
120.0000 mg | Freq: Once | INTRAMUSCULAR | Status: AC
  Administered 2010-12-08: 120 mg via EPIDURAL

## 2010-12-08 MED ORDER — IOHEXOL 180 MG/ML  SOLN
1.0000 mL | Freq: Once | INTRAMUSCULAR | Status: AC | PRN
  Administered 2010-12-08: 1 mL via EPIDURAL

## 2010-12-08 MED ORDER — DIAZEPAM 2 MG PO TABS
5.0000 mg | ORAL_TABLET | Freq: Once | ORAL | Status: AC
  Administered 2010-12-08: 5 mg via ORAL

## 2010-12-08 NOTE — Progress Notes (Signed)
Pt states she took her 13-hour prep as prescribed.  INR 1.0.  jkl

## 2010-12-12 ENCOUNTER — Ambulatory Visit (HOSPITAL_COMMUNITY)
Admission: RE | Admit: 2010-12-12 | Discharge: 2010-12-12 | Disposition: A | Discharge status: Home / Self Care | Payer: Medicare Other | Source: Ambulatory Visit | Attending: Internal Medicine | Admitting: Internal Medicine

## 2010-12-12 DIAGNOSIS — IMO0001 Reserved for inherently not codable concepts without codable children: Secondary | ICD-10-CM | POA: Insufficient documentation

## 2010-12-12 DIAGNOSIS — L97309 Non-pressure chronic ulcer of unspecified ankle with unspecified severity: Secondary | ICD-10-CM | POA: Insufficient documentation

## 2010-12-12 NOTE — Progress Notes (Signed)
Physical Therapy - Wound Therapy  Patient Details  Name: [name] MRN: [id number] Date of Birth: [birth year]  Today's Date: 12/12/2010 Time: 4098-1191 Time Calculation (min): 88 min Visit#: 81 Re-eval: 12/12/10 Charge: Selective debridement < 20 cm  estim unattended (negative polarity #1) 1 unit, Re-evalulation  Wound Therapy Wound 05/04/10 Abrasion(s);Other (Comment) Ankle Left;Lateral scratch that turned into ulcer; 2.0cmX1cmX0.25cm with 95% slough and 5% granulation (Active)  Site / Wound Assessment Clean;Dry 12/12/2010  8:00 AM  % Wound base Black 0% 11/03/2010  8:00 AM  Peri-wound Assessment Erythema (blanchable);Bleeding 11/06/2010  8:00 AM  Wound Length (cm) 2.5 cm 12/12/2010  8:00 AM  Wound Width (cm) 0.75 cm 12/12/2010  8:00 AM  Wound Depth (cm) 0.2 cm 12/12/2010  8:00 AM  Margins Other (Comment) 10/23/2010  8:00 AM  Drainage Amount Minimal 12/12/2010  8:00 AM  Drainage Description Sanguineous 12/12/2010  8:00 AM  Non-staged Wound Description Not applicable 11/08/2010  8:00 AM  Treatment Cleansed;Electrical stimulation 12/12/2010  8:00 AM  Dressing Type Hydrogel 12/12/2010  8:00 AM  Dressing Changed New 12/12/2010  8:00 AM  Dressing Status Clean;Dry;Intact 12/12/2010  8:00 AM       Physical Therapy Assessment and Plan Wound Therapy - Assess/Plan/Recommendations Wound Therapy - Clinical Statement: wound continues to decrease in size/increase in granulation. Wound Therapy - Functional Problem List: Vascular compromise Factors Delaying/Impairing Wound Healing: Vascular compromise Hydrotherapy Plan: Debridement;Dressing change;Electrical stimulation PT Assessment and Plan Clinical Impression Statement: Wound decreasing in depth, length and width.  Granulation increased to 92% today.  Wound responding well to current treatment. Rehab Potential: Good PT Frequency: Min 2X/week PT Duration: 4 weeks PT Treatment/Interventions:  (Woundcare including excisional debridement, high-volt estim ) PT  Plan: Continue X 4 more weeks with current POC.    Goals PT Short Term Goals PT Short Term Goal 1: Necrotic tissue to be 0%, granulation to be 100%  PT Short Term Goal 1 - Progress: Progressing toward goal PT Short Term Goal 2: Pt/family will be independent with dressing changes/self-care PT Short Term Goal 2 - Progress: Met PT Short Term Goal 3: Decreased wound size by 1.5cm X 0.5cm X 0.25cm  PT Short Term Goal 3 - Progress: Progressing toward goal PT Short Term Goal 4: Decreased drainage equal or less than scant  PT Short Term Goal 4 - Progress: Progressing toward goal  Problem List Patient Active Problem List  Diagnoses  . HYPERLIPIDEMIA  . HYPERTENSION  . ATRIAL FIBRILLATION  . ACUTE ON CHRONIC DIASTOLIC HEART FAILURE  . Occlusion and stenosis of carotid artery without mention of cerebral infarction  . SYNCOPE AND COLLAPSE  . Long term current use of anticoagulant  . Wound of ankle    [name] 12/12/2010, 8:40 AM

## 2010-12-15 ENCOUNTER — Ambulatory Visit (HOSPITAL_COMMUNITY)
Admission: RE | Admit: 2010-12-15 | Discharge: 2010-12-15 | Disposition: A | Discharge status: Home / Self Care | Payer: Medicare Other | Source: Ambulatory Visit | Attending: Internal Medicine | Admitting: Internal Medicine

## 2010-12-15 DIAGNOSIS — I4891 Unspecified atrial fibrillation: Secondary | ICD-10-CM

## 2010-12-15 DIAGNOSIS — Z7901 Long term (current) use of anticoagulants: Secondary | ICD-10-CM

## 2010-12-15 NOTE — Progress Notes (Signed)
Physical Therapy - Wound Therapy  Patient Details  Name: [name] MRN: [id number] Date of Birth: June 22, 1940  Today's Date: 12/15/2010 Time: 4098-1191 Time Calculation (min): 88 min Visit#: 82 Re-eval: 01/11/11 Charge: selective debridement < 20 cm Estim hi volt negative polarity #2 x 1 unit Wound Therapy Wound 05/04/10 Abrasion(s);Other (Comment) Ankle Left;Lateral scratch that turned into ulcer; 2.0cmX1cmX0.25cm with 95% slough and 5% granulation (Active)  Site / Wound Assessment Clean;Dry 12/15/2010  8:00 AM  % Wound base Black 0% 11/03/2010  8:00 AM  Peri-wound Assessment Erythema (blanchable);Bleeding 11/06/2010  8:00 AM  Wound Length (cm) 2.5 cm 12/15/2010  8:00 AM  Wound Width (cm) 0.65 cm 12/15/2010  8:00 AM  Wound Depth (cm) 0.2 cm 12/15/2010  8:00 AM  Margins Other (Comment) 10/23/2010  8:00 AM  Drainage Amount Minimal 12/15/2010  8:00 AM  Drainage Description Sanguineous 12/15/2010  8:00 AM  Non-staged Wound Description Not applicable 11/08/2010  8:00 AM  Treatment Cleansed 12/15/2010  8:00 AM  Dressing Type Hydrogel 12/15/2010  8:00 AM  Dressing Changed New 12/15/2010  8:00 AM  Dressing Status Clean;Dry;Intact 12/15/2010  8:00 AM       Physical Therapy Assessment and Plan Wound Therapy - Assess/Plan/Recommendations Wound Therapy - Clinical Statement: wound continues to decrease in size/increase in granulation. Wound Therapy - Functional Problem List: Vascular compromise Factors Delaying/Impairing Wound Healing: Vascular compromise Hydrotherapy Plan: Debridement;Dressing change;Electrical stimulation PT Assessment and Plan Clinical Impression Statement: Wound decreasing in depth, length and width. Granulation increased to 93% today. Wound responding well to current treatment PT Plan: Continue wound care.    Goals    Problem List Patient Active Problem List  Diagnoses  . HYPERLIPIDEMIA  . HYPERTENSION  . ATRIAL FIBRILLATION  . ACUTE ON CHRONIC DIASTOLIC HEART FAILURE  . Occlusion  and stenosis of carotid artery without mention of cerebral infarction  . SYNCOPE AND COLLAPSE  . Long term current use of anticoagulant  . Wound of ankle    [name] 12/15/2010, 9:40 AM

## 2010-12-17 ENCOUNTER — Encounter (HOSPITAL_COMMUNITY): Payer: Self-pay

## 2010-12-17 ENCOUNTER — Other Ambulatory Visit: Payer: Self-pay

## 2010-12-17 ENCOUNTER — Emergency Department (HOSPITAL_COMMUNITY)
Admission: EM | Admit: 2010-12-17 | Discharge: 2010-12-17 | Disposition: A | Discharge status: Home / Self Care | Payer: Medicare Other | Attending: Emergency Medicine | Admitting: Emergency Medicine

## 2010-12-17 DIAGNOSIS — Z79899 Other long term (current) drug therapy: Secondary | ICD-10-CM | POA: Insufficient documentation

## 2010-12-17 DIAGNOSIS — I10 Essential (primary) hypertension: Secondary | ICD-10-CM | POA: Insufficient documentation

## 2010-12-17 DIAGNOSIS — R42 Dizziness and giddiness: Secondary | ICD-10-CM | POA: Insufficient documentation

## 2010-12-17 DIAGNOSIS — R269 Unspecified abnormalities of gait and mobility: Secondary | ICD-10-CM | POA: Insufficient documentation

## 2010-12-17 HISTORY — DX: Pneumonia, unspecified organism: J18.9

## 2010-12-17 LAB — BASIC METABOLIC PANEL
BUN: 19 mg/dL (ref 6–23)
CO2: 24 mEq/L (ref 19–32)
Calcium: 8.8 mg/dL (ref 8.4–10.5)
Creatinine, Ser: 0.51 mg/dL (ref 0.50–1.10)
GFR calc non Af Amer: >60 mL/min (ref >60)
Glucose, Bld: 89 mg/dL (ref 70–99)
Sodium: 134 mEq/L — ABNORMAL LOW (ref 135–145)

## 2010-12-17 LAB — CBC
HCT: 36.7 % (ref 36.0–46.0)
Hemoglobin: 12.1 g/dL (ref 12.0–15.0)
MCH: 29.4 pg (ref 26.0–34.0)
MCHC: 33 g/dL (ref 30.0–36.0)
MCV: 89.3 fL (ref 78.0–100.0)
RBC: 4.11 MIL/uL (ref 3.87–5.11)

## 2010-12-17 LAB — URINALYSIS, ROUTINE W REFLEX MICROSCOPIC
Bilirubin Urine: NEGATIVE
Glucose, UA: NEGATIVE mg/dL
Ketones, ur: NEGATIVE mg/dL
Nitrite: NEGATIVE
Specific Gravity, Urine: 1.02 (ref 1.005–1.030)
pH: 7 (ref 5.0–8.0)

## 2010-12-17 LAB — PROTIME-INR: INR: 1.46 (ref 0.00–1.49)

## 2010-12-17 MED ORDER — SODIUM CHLORIDE 0.9 % IV SOLN
Freq: Once | INTRAVENOUS | Status: AC
  Administered 2010-12-17: 15:00:00 via INTRAVENOUS

## 2010-12-17 NOTE — ED Provider Notes (Signed)
History    [name] has a history of stroke and hypertension. she no longer smokes she says that she spilled some detergent on the floor today so which she was bending over and standing up repeatedly and she became dizzy she denies a headache nausea vomiting vision changes palpitations of her heart or shortness of breath she denies recent illness over the past few days she is asymptomatic  now except for the feeling that she is unsteady on her feet this has persisted since this morning so she decided to come to the emergency department  CSN: [id number] Arrival date & time: 12/17/2010  1:12 PM  Chief Complaint  Patient presents with  . Dizziness   The history is provided by the patient.    Past Medical History  Diagnosis Date  . Subclavian steal syndrome   . Carotid bruit     LICA 60-79% (duplex 9/11)  . Hypertension   . Hyperlipidemia   . Atrial fibrillation   . Anticoagulation management encounter   . Breast cancer     S/P left mastectomy  . Meningioma   . Osteoporosis   . Stroke   . Pneumonia     Past Surgical History  Procedure Date  . Cardiac catheterization   . Mastectomy     left in 1990  . Cataract surgery     recently  . Colonoscopy 11/17/2010    Procedure: COLONOSCOPY;  Surgeon: Malissa Hippo, MD;  Location: [facility];  Service: Endoscopy;  Laterality: N/A;  10:45 am  . Esophagogastroduodenoscopy 11/17/2010    Procedure: ESOPHAGOGASTRODUODENOSCOPY (EGD);  Surgeon: Malissa Hippo, MD;  Location: [facility];  Service: Endoscopy;  Laterality: N/A;  . Mastectomy     Family History  Problem Relation Age of Onset  . Alzheimer's disease Mother   . Dementia Mother   . Parkinsonism Father     History  Substance Use Topics  . Smoking status: Former Smoker    Types: Cigarettes  . Smokeless tobacco: Not on file   Comment: x 4 yrs.  . Alcohol Use: No     occasional alcohol    OB History    Grav Para Term Preterm Abortions TAB SAB Ect Mult Living             Review of Systems  Constitutional: Negative for chills and diaphoresis.  HENT: Negative for congestion and neck pain.   Eyes: Negative for redness.  Respiratory: Negative for cough, chest tightness and shortness of breath.   Gastrointestinal: Negative for abdominal pain.  Genitourinary: Negative for dysuria.  Musculoskeletal: Negative for back pain.  Skin: Negative for rash.  Neurological: Negative for light-headedness, numbness and headaches.  Psychiatric/Behavioral: Negative for confusion.    Physical Exam  BP 163/82  Pulse 67  Temp(Src) 97.8 F (36.6 C) (Oral)  Resp 20  Ht 5\' 5"  (1.651 m)  Wt 120 lb (54.432 kg)  BMI 19.97 kg/m2  SpO2 98%  Physical Exam  Constitutional: She is oriented to person, place, and time. She appears well-developed and well-nourished.  HENT:  Head: Normocephalic and atraumatic.  Eyes: Pupils are equal, round, and reactive to light.  Neck: Normal range of motion. Neck supple. Carotid bruit is present (left sided carotid bruit > right).  Cardiovascular: Normal rate, normal heart sounds and intact distal pulses.   Pulmonary/Chest: No respiratory distress. She has no wheezes. She has no rales.  Abdominal: She exhibits no distension and no mass. There is no tenderness. There is no rebound and  no guarding.  Musculoskeletal: She exhibits no edema and no tenderness.  Neurological: She is alert and oriented to person, place, and time. No cranial nerve deficit.  Skin: Skin is warm and dry. No rash noted.  Psychiatric: She has a normal mood and affect.    ED Course  Procedures  4:11 PM  She says she feels back to normal now.   Results for orders placed during the hospital encounter of 12/17/10  CBC      Component Value Range   WBC 14.9 (*) 4.0 - 10.5 (K/uL)   RBC 4.11  3.87 - 5.11 (MIL/uL)   Hemoglobin 12.1  12.0 - 15.0 (g/dL)   HCT 16.1  09.6 - 04.5 (%)   MCV 89.3  78.0 - 100.0 (fL)   MCH 29.4  26.0 - 34.0 (pg)   MCHC 33.0  30.0 -  36.0 (g/dL)   RDW 40.9 (*) 81.1 - 15.5 (%)   Platelets 306  150 - 400 (K/uL)  BASIC METABOLIC PANEL      Component Value Range   Sodium 134 (*) 135 - 145 (mEq/L)   Potassium 4.0  3.5 - 5.1 (mEq/L)   Chloride 102  96 - 112 (mEq/L)   CO2 24  19 - 32 (mEq/L)   Glucose, Bld 89  70 - 99 (mg/dL)   BUN 19  6 - 23 (mg/dL)   Creatinine, Ser 9.14  0.50 - 1.10 (mg/dL)   Calcium 8.8  8.4 - 78.2 (mg/dL)   GFR calc non Af Amer >60  >60 (mL/min)   GFR calc Af Amer >60  >60 (mL/min)  PROTIME-INR      Component Value Range   Prothrombin Time 18.0 (*) 11.6 - 15.2 (seconds)   INR 1.46  0.00 - 1.49    ED ECG REPORT   Date: 12/17/2010  EKG Time: 14:05    Rate: 57   Rhythm: sinus bradycardia,  Axis: normal   Intervals:none  ST&T Change: none  Narrative Interpretation:poor r wave progression, consider old ant wall mi   4:53 PM reamains asx.     MDM   Dizziness which has resolved after IV fluids leukocytosis which does not correlate with the clinical evaluation there is no evidence of infection stress for him history of steroid use.  . No evidence of urinary tract infection in this elderly female with history of dizziness        Nicholes Stairs, MD 12/17/10 1658

## 2010-12-17 NOTE — ED Notes (Signed)
This am around 0700 pt was working at her house, says was bending up and down repeatedly and suddenly felt dizzy.  Says is very unsteady on her feet.  C/O slight headache.  Reports history of cva in 2008.  Also ems reports pt was admitted in April with MRSA and pneumonia.

## 2010-12-18 ENCOUNTER — Encounter: Payer: Self-pay | Admitting: Cardiovascular Disease

## 2010-12-18 ENCOUNTER — Inpatient Hospital Stay (HOSPITAL_COMMUNITY): Admission: RE | Admit: 2010-12-18 | Payer: Medicare Other | Source: Ambulatory Visit | Admitting: Physical Therapy

## 2010-12-18 ENCOUNTER — Ambulatory Visit (INDEPENDENT_AMBULATORY_CARE_PROVIDER_SITE_OTHER): Payer: Medicare Other | Admitting: Cardiovascular Disease

## 2010-12-18 ENCOUNTER — Ambulatory Visit (INDEPENDENT_AMBULATORY_CARE_PROVIDER_SITE_OTHER): Payer: Medicare Other | Admitting: *Deleted

## 2010-12-18 DIAGNOSIS — I4891 Unspecified atrial fibrillation: Secondary | ICD-10-CM

## 2010-12-18 DIAGNOSIS — I10 Essential (primary) hypertension: Secondary | ICD-10-CM

## 2010-12-18 DIAGNOSIS — E785 Hyperlipidemia, unspecified: Secondary | ICD-10-CM

## 2010-12-18 DIAGNOSIS — Z7901 Long term (current) use of anticoagulants: Secondary | ICD-10-CM

## 2010-12-18 DIAGNOSIS — R55 Syncope and collapse: Secondary | ICD-10-CM

## 2010-12-18 NOTE — Progress Notes (Signed)
Glena is seen for F/U of PAF, bruit, right subclavian steal and anticoagulatin She has known vascular disease primarily in the sublavian and carotids. She was admitted with syncope last year . She had PAF with diastolic heart failure. Her cath showed only diagonal disease with no critical major vessel disease. EF was normal. 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 She has 60-79% stenosis on left ICA. In combination with rapid atrial fibrillation this may have contributed to a brief loss of consdousness. Her diastolic CHF cleared with restoration of NSR. She was sent home on low dose amiodarone. She has had no SOB, or palpitations since being home. there have been no bleeding problems. She denies SSCP, Has had an ulcer on the lateral aspect of the left ankle. Seeing wound care center and has been on antibiotics This has made her coumadin hard to control.   Reviewed duplex from [facility] 9/1 60-79% LICA stenosis. Innominate stenosis.  Has F/U duplex with [facility] next week  Planning trip to [place] and   ROS: Denies fever, malais, weight loss, blurry vision, decreased visual acuity, cough, sputum, SOB, hemoptysis, pleuritic pain, palpitaitons, heartburn, abdominal pain, melena, lower extremity edema, claudication, or rash.  All other systems reviewed and negative  General: Affect appropriate Chronically ill and frail HEENT: normal Neck supple with no adenopathy JVP normal bilateral subclavian  bruits no thyromegaly Lungs clear with no wheezing and good diaphragmatic motion Heart:  S1/S2 no murmur,rub, gallop or click PMI normal Abdomen: benighn, BS positve, no tenderness, no AAA no bruit.  No HSM or HJR Distal pulses intact with no bruits No edema Neuro non-focal Skin warm and dry ulcer on left ankle is wrapped No muscular weakness   Current Outpatient Prescriptions  Medication Sig Dispense Refill  . alendronate (FOSAMAX) 70 MG  tablet Take 70 mg by mouth every 7 (seven) days. Take in the morning with a full glass of water, on an empty stomach, and do not take anything else by mouth or lie down for the next 30 min. Patient take on Sunday      . amiodarone (PACERONE) 200 MG tablet Take 1 tablet (200 mg total) by mouth 2 (two) times daily.  60 tablet  1  . amLODipine-valsartan (EXFORGE) 10-320 MG per tablet Take 1 tablet by mouth daily.        Marland Kitchen ascorbic acid (VITAMIN C) 500 MG tablet Take 500 mg by mouth every morning.        Marland Kitchen aspirin EC 81 MG tablet Take 81 mg by mouth daily.        . calcium-vitamin D (OSCAL) 250-125 MG-UNIT per tablet Take 1 tablet by mouth 2 (two) times daily.        . clopidogrel (PLAVIX) 75 MG tablet Take 75 mg by mouth every morning.        . colesevelam (WELCHOL) 625 MG tablet Take 1,875 mg by mouth 3 (three) times daily.       . ferrous sulfate 325 (65 FE) MG EC tablet Take 1 tablet (325 mg total) by mouth 2 (two) times daily.      . fish oil-omega-3 fatty acids 1000 MG capsule Take by mouth daily.        Marland Kitchen HYDROcodone-acetaminophen (NORCO) 5-325 MG per tablet Take 1 tablet by mouth every 6 (six) hours as needed. pain      . meloxicam (MOBIC) 15 MG tablet Take 15 mg by mouth  every morning.        . metoprolol tartrate (LOPRESSOR) 25 MG tablet Take 75 mg by mouth 2 (two) times daily.        . Multiple Vitamin (MULTIVITAMIN) capsule Take 1 capsule by mouth daily.        . pravastatin (PRAVACHOL) 40 MG tablet Take 40 mg by mouth at bedtime.       Marland Kitchen warfarin (COUMADIN) 4 MG tablet Take 4 mg by mouth daily. Coumadin 4mg  on Sund, Tuesday, Thursday., 2mg  on Monday, Wednesday, Friday and Saturday       No current facility-administered medications for this visit.   Facility-Administered Medications Ordered in Other Visits  Medication Dose Route Frequency Provider Last Rate Last Dose  . 0.9 %  sodium chloride infusion   Intravenous Once Nicholes Stairs, MD        Allergies  Iodine and  Iohexol  Electrocardiogram:  Assessment and Plan

## 2010-12-18 NOTE — Assessment & Plan Note (Signed)
Related to postural motion and innominate large vessel disease.  Stay hydrated and avoid postural changes.  [facility] F/U with duplex

## 2010-12-18 NOTE — Assessment & Plan Note (Signed)
Cholesterol is at goal.  Continue current dose of statin and diet Rx.  No myalgias or side effects.  F/U  LFT's in 6 months. Lab Results  Component Value Date   LDLCALC 60 08/11/2010             

## 2010-12-18 NOTE — Patient Instructions (Addendum)
Your physician recommends that you schedule a follow-up appointment in: 6 MONTHS WITH DR NISHAN  Your physician recommends that you continue on your current medications as directed. Please refer to the Current Medication list given to you today. 

## 2010-12-18 NOTE — Assessment & Plan Note (Signed)
Good rate control and anticoagulation  

## 2010-12-18 NOTE — Assessment & Plan Note (Signed)
Well controlled.  Continue current medications and low sodium Dash type diet.    

## 2010-12-19 ENCOUNTER — Ambulatory Visit (HOSPITAL_COMMUNITY)
Admission: RE | Admit: 2010-12-19 | Discharge: 2010-12-19 | Disposition: A | Discharge status: Home / Self Care | Payer: Medicare Other | Source: Ambulatory Visit | Attending: Internal Medicine | Admitting: Internal Medicine

## 2010-12-19 NOTE — Progress Notes (Signed)
Physical Therapy - Wound Therapy  Patient Details  Name: [name] MRN: [id number] Date of Birth: Aug 19, 1940  Today's Date: 12/19/2010 Time: 4098-1191 Time Calculation (min): 86 min Visit#: 83 Re-eval: 01/11/11 Charges: selective debridement < 20 cm  Estim hi volt negative polarity #3 x 1 unit   Wound Therapy Wound 05/04/10 Abrasion(s);Other (Comment) Ankle Left;Lateral scratch that turned into ulcer; 2.0cmX1cmX0.25cm with 95% slough and 5% granulation (Active)  Site / Wound Assessment Clean;Dry 12/19/2010  8:00 AM  % Wound base Black 0% 11/03/2010  8:00 AM  Peri-wound Assessment Erythema (blanchable);Bleeding 11/06/2010  8:00 AM  Wound Length (cm) 2.5 cm 12/15/2010  8:00 AM  Wound Width (cm) 0.65 cm 12/15/2010  8:00 AM  Wound Depth (cm) 0.2 cm 12/15/2010  8:00 AM  Margins Other (Comment) 10/23/2010  8:00 AM  Drainage Amount Minimal 12/19/2010  8:00 AM  Drainage Description Sanguineous 12/19/2010  8:00 AM  Non-staged Wound Description Not applicable 11/08/2010  8:00 AM  Treatment Cleansed;Electrical stimulation 12/19/2010  8:00 AM  Dressing Type Hydrogel 12/19/2010  8:00 AM  Dressing Changed New 12/19/2010  8:00 AM  Dressing Status Clean;Dry;Intact 12/19/2010  8:00 AM     Physical Therapy Assessment and Plan Wound Therapy - Assess/Plan/Recommendations Wound Therapy - Clinical Statement: wound continues to decrease in size/increase in granulation. Wound Therapy - Functional Problem List: Vascular compromise Factors Delaying/Impairing Wound Healing: Vascular compromise Hydrotherapy Plan: Debridement;Dressing change;Electrical stimulation PT Assessment and Plan Clinical Impression Statement: Wound continues to progress and appears smaller in size. Increased granualtion after debridement. Pt w/dried blood at 5:00 outter edge, 60% of dried blood removed. PT Treatment/Interventions: Other (comment) (Woundcare including excisional debridement, high-volt estim ) PT Plan: Continue per PT POC. Begin  positive polarity tx # 1 next visit.     Problem List Patient Active Problem List  Diagnoses  . HYPERLIPIDEMIA  . HYPERTENSION  . ATRIAL FIBRILLATION  . ACUTE ON CHRONIC DIASTOLIC HEART FAILURE  . Occlusion and stenosis of carotid artery without mention of cerebral infarction  . SYNCOPE AND COLLAPSE  . Long term current use of anticoagulant  . Wound of ankle    [name] 12/19/2010, 8:48 AM

## 2010-12-20 ENCOUNTER — Ambulatory Visit (HOSPITAL_COMMUNITY): Payer: Medicare Other

## 2010-12-22 ENCOUNTER — Ambulatory Visit (HOSPITAL_COMMUNITY)
Admission: RE | Admit: 2010-12-22 | Discharge: 2010-12-22 | Disposition: A | Discharge status: Home / Self Care | Payer: Medicare Other | Source: Ambulatory Visit | Attending: Internal Medicine | Admitting: Internal Medicine

## 2010-12-22 NOTE — Progress Notes (Signed)
Physical Therapy - Wound Therapy  Patient Details  Name: [name] MRN: [id number] Date of Birth: 1940/12/28  Today's Date: 12/22/2010 Time: 1308-6578 Time Calculation (min): 86 min Charges: Deb < 20cm, 1 e-stim Visit#: 84 Re-eval: 01/11/11  Wound Therapy Wound 05/04/10 Abrasion(s);Other (Comment) Ankle Left;Lateral scratch that turned into ulcer; 2.0cmX1cmX0.25cm with 95% slough and 5% granulation (Active)  Site / Wound Assessment Clean;Dry 12/22/2010  8:00 AM  % Wound base Black 0% 11/03/2010  8:00 AM  Peri-wound Assessment Erythema (blanchable);Bleeding 11/06/2010  8:00 AM  Wound Length (cm) 2.5 cm 12/15/2010  8:00 AM  Wound Width (cm) 0.65 cm 12/15/2010  8:00 AM  Wound Depth (cm) 0.2 cm 12/15/2010  8:00 AM  Margins Other (Comment) 10/23/2010  8:00 AM  Drainage Amount Minimal 12/22/2010  8:00 AM  Drainage Description Sanguineous 12/22/2010  8:00 AM  Non-staged Wound Description Not applicable 11/08/2010  8:00 AM  Treatment Cleansed;Electrical stimulation 12/22/2010  8:00 AM  Dressing Type Hydrogel 12/22/2010  8:00 AM  Dressing Changed New 12/22/2010  8:00 AM  Dressing Status Clean;Dry;Intact 12/22/2010  8:00 AM   Electrical Stimulation Electrical Stimulation - Location: L lateral ankle Electrical Stimulation - Polarity: Positive #1 Electrical Stimulation - Pulse Rate: Continuous Electrical Stimulation - Intensity: 150 mV Electrical Stimulation - Duration: 60 minutes   Physical Therapy Assessment and Plan Wound Therapy - Assess/Plan/Recommendations Wound Therapy - Clinical Statement: wound continues to decrease in size/increase in granulation. Wound Therapy - Functional Problem List: Vascular compromise Factors Delaying/Impairing Wound Healing: Vascular compromise Hydrotherapy Plan: Debridement;Dressing change;Electrical stimulation      Goals    Problem List Patient Active Problem List  Diagnoses  . HYPERLIPIDEMIA  . HYPERTENSION  . ATRIAL FIBRILLATION  . ACUTE ON CHRONIC  DIASTOLIC HEART FAILURE  . Occlusion and stenosis of carotid artery without mention of cerebral infarction  . SYNCOPE AND COLLAPSE  . Long term current use of anticoagulant  . Wound of ankle    [name] 12/22/2010, 8:36 AM

## 2010-12-25 ENCOUNTER — Ambulatory Visit (HOSPITAL_COMMUNITY): Payer: Medicare Other | Admitting: *Deleted

## 2010-12-25 ENCOUNTER — Other Ambulatory Visit (INDEPENDENT_AMBULATORY_CARE_PROVIDER_SITE_OTHER): Payer: Medicare Other | Admitting: *Deleted

## 2010-12-25 DIAGNOSIS — I6529 Occlusion and stenosis of unspecified carotid artery: Secondary | ICD-10-CM

## 2010-12-26 ENCOUNTER — Ambulatory Visit (HOSPITAL_COMMUNITY)
Admission: RE | Admit: 2010-12-26 | Discharge: 2010-12-26 | Disposition: A | Discharge status: Home / Self Care | Payer: Medicare Other | Source: Ambulatory Visit | Attending: Internal Medicine | Admitting: Internal Medicine

## 2010-12-26 NOTE — Progress Notes (Signed)
Physical Therapy - Wound Therapy  Patient Details  Name: [name] MRN: [id number] Date of Birth: 1940-08-18  Today's Date: 12/26/2010 Time: 1005-1025 Time Calculation (min): 20 min Visit#: 85 Re-eval: 01/11/11 Charges:  Debridement <20cm  Wound Therapy Wound 05/04/10 Abrasion(s);Other (Comment) Ankle Left;Lateral scratch that turned into ulcer; 2.0cmX1cmX0.25cm with 95% slough and 5% granulation (Active)  Site / Wound Assessment Clean;Dry 12/26/2010 12:00 PM  % Wound base Black 0% 11/03/2010  8:00 AM  Peri-wound Assessment Erythema (blanchable);Bleeding 11/06/2010  8:00 AM  Wound Length (cm) 2.5 cm 12/15/2010  8:00 AM  Wound Width (cm) 0.65 cm 12/15/2010  8:00 AM  Wound Depth (cm) 0.2 cm 12/15/2010  8:00 AM  Margins Other (Comment) 10/23/2010  8:00 AM  Drainage Amount Minimal 12/26/2010 12:00 PM  Drainage Description Sanguineous 12/26/2010 12:00 PM  Non-staged Wound Description Not applicable 11/08/2010  8:00 AM  Treatment Cleansed;Debridement (Selective) 12/26/2010 12:00 PM  Dressing Type Hydrogel 12/26/2010 12:00 PM  Dressing Changed New 12/26/2010 12:00 PM  Dressing Status Clean;Dry;Intact 12/26/2010 12:00 PM       Physical Therapy Assessment and Plan Wound Therapy - Assess/Plan/Recommendations Wound Therapy - Clinical Statement: wound continues to decrease in size/increase in granulation. Wound Therapy - Functional Problem List: Vascular compromise Factors Delaying/Impairing Wound Healing: Vascular compromise Hydrotherapy Plan: Debridement;Dressing change;Electrical stimulation PT Assessment and Plan Clinical Impression Statement: Wound continues to decrease in size, filling with granulation.  Pt. most sensitive to inferior edge of wound with debridement. PT Treatment/Interventions:  (Woundcare only today) PT Plan: Continue woundcare.  Take measurements next visit as will be last visit before 3 week vacation.       Problem List Patient Active Problem List  Diagnoses  .  HYPERLIPIDEMIA  . HYPERTENSION  . ATRIAL FIBRILLATION  . ACUTE ON CHRONIC DIASTOLIC HEART FAILURE  . Occlusion and stenosis of carotid artery without mention of cerebral infarction  . SYNCOPE AND COLLAPSE  . Long term current use of anticoagulant  . Wound of ankle    Lurena Nida 12/26/2010, 12:18 PM

## 2010-12-27 ENCOUNTER — Ambulatory Visit (HOSPITAL_COMMUNITY): Payer: Medicare Other | Admitting: Physical Therapy

## 2010-12-28 ENCOUNTER — Ambulatory Visit (INDEPENDENT_AMBULATORY_CARE_PROVIDER_SITE_OTHER): Payer: Medicare Other | Admitting: *Deleted

## 2010-12-28 DIAGNOSIS — I4891 Unspecified atrial fibrillation: Secondary | ICD-10-CM

## 2010-12-28 DIAGNOSIS — Z7901 Long term (current) use of anticoagulants: Secondary | ICD-10-CM

## 2010-12-28 LAB — POCT INR: INR: 3.8

## 2010-12-29 ENCOUNTER — Ambulatory Visit (HOSPITAL_COMMUNITY)
Admission: RE | Admit: 2010-12-29 | Discharge: 2010-12-29 | Disposition: A | Discharge status: Home / Self Care | Payer: Medicare Other | Source: Ambulatory Visit | Attending: Physical Therapy | Admitting: Physical Therapy

## 2010-12-29 NOTE — Progress Notes (Signed)
Physical Therapy - Wound Therapy  Patient Details  Name: [name] MRN: [id number] Date of Birth: 1940-08-26  Today's Date: 12/29/2010 Time: 7829-5621 Time Calculation (min): 87 min Charges: deb <20 cm, 1 e-stim Visit#: 86 Re-eval: 01/11/11  Wound Therapy Wound 05/04/10 Abrasion(s);Other (Comment) Ankle Left;Lateral scratch that turned into ulcer; 2.0cmX1cmX0.25cm with 95% slough and 5% granulation (Active)  Site / Wound Assessment Clean;Dry 12/29/2010  8:00 AM  % Wound base Black 0% 11/03/2010  8:00 AM  Peri-wound Assessment Erythema (blanchable);Bleeding 11/06/2010  8:00 AM  Wound Length (cm) 2.1 cm 12/29/2010  8:00 AM  Wound Width (cm) 1 cm 12/29/2010  8:00 AM  Wound Depth (cm) 0.2 cm 12/29/2010  8:00 AM  Margins Other (Comment) 10/23/2010  8:00 AM  Drainage Amount Minimal 12/29/2010  8:00 AM  Drainage Description Sanguineous 12/29/2010  8:00 AM  Non-staged Wound Description Not applicable 11/08/2010  8:00 AM  Treatment Cleansed;Debridement (Selective);Electrical stimulation 12/29/2010  8:00 AM  Dressing Type Hydrogel 12/29/2010  8:00 AM  Dressing Changed New 12/29/2010  8:00 AM  Dressing Status Clean;Dry;Intact 12/29/2010  8:00 AM   Electrical Stimulation Electrical Stimulation - Location: L lateral ankle Electrical Stimulation - Polarity: Positive #2 Electrical Stimulation - Pulse Rate: Continuous Electrical Stimulation - Intensity: 150 mV Electrical Stimulation - Duration: 60 minutes   Physical Therapy Assessment and Plan Wound Therapy - Assess/Plan/Recommendations Wound Therapy - Clinical Statement: wound continues to decrease in size/increase in granulation. Wound Therapy - Functional Problem List: Vascular compromise Factors Delaying/Impairing Wound Healing: Vascular compromise Hydrotherapy Plan: Debridement;Dressing change;Electrical stimulation PT Assessment and Plan Clinical Impression Statement: Her wound continues to decrease in length and has had a moderate increased in  width.  She continues to be more sensitive to inferior lateral edge of wound with debridment.  She tolerated manual therapy debridement.  Pt does not display s/s of infection.  PT Plan: Continue woundcare. Take measurements after she returns from vacation.    Goals    Problem List Patient Active Problem List  Diagnoses  . HYPERLIPIDEMIA  . HYPERTENSION  . ATRIAL FIBRILLATION  . ACUTE ON CHRONIC DIASTOLIC HEART FAILURE  . Occlusion and stenosis of carotid artery without mention of cerebral infarction  . SYNCOPE AND COLLAPSE  . Long term current use of anticoagulant  . Wound of ankle    [name] 12/29/2010, 9:54 AM

## 2011-01-01 ENCOUNTER — Other Ambulatory Visit: Payer: Medicare Other

## 2011-01-08 ENCOUNTER — Encounter: Payer: Self-pay | Admitting: Surgery

## 2011-01-08 NOTE — Procedures (Unsigned)
CAROTID DUPLEX EXAM - Corrected Report  INDICATION:  Followup left carotid disease.  HISTORY: Known innominate occlusion. Diabetes:  No. Cardiac:  No. Hypertension:  Yes. Smoking:  Previous. Previous Surgery:  Mastectomy. CV History:  CVA 2008. Amaurosis Fugax No, Paresthesias No, Hemiparesis No                                      RIGHT             LEFT Brachial systolic pressure: Brachial Doppler waveforms: Vertebral direction of flow:                          Abnormal DUPLEX VELOCITIES (cm/sec) CCA peak systolic                                     124 ECA peak systolic                                     141 ICA peak systolic                                     256 ICA end diastolic                                     66 PLAQUE MORPHOLOGY:                                    Heterogeneous PLAQUE AMOUNT:                                        Mild PLAQUE LOCATION:                                      ECA / ICA  IMPRESSION: 1. Brachial blood pressures were not performed due to previous     axillary lymph node removal. 2. 69%-75%  60-79% stenosis of left internal carotid artery by     velocity, however, minimal stenosis is observed.  Suspect     compensatory flow due to contralateral proximal occlusion. 3. Left vertebral artery is abnormal. 4. Left common carotid artery velocity is abnormal with turbulent     waveforms which may indicate proximal stenosis.      ___________________________________________ V. Charlena Cross, MD  LT/MEDQ  D:  12/25/2010  T:  12/25/2010  Job:  [id number]

## 2011-01-11 ENCOUNTER — Other Ambulatory Visit: Payer: Self-pay | Admitting: Vascular Surgery

## 2011-01-11 DIAGNOSIS — I6529 Occlusion and stenosis of unspecified carotid artery: Secondary | ICD-10-CM

## 2011-01-22 LAB — CBC
HCT: 40.8
MCV: 94.1
RBC: 4.33
WBC: 9.6

## 2011-01-22 LAB — DIFFERENTIAL
Eosinophils Absolute: 0.1
Eosinophils Relative: 1
Lymphocytes Relative: 24
Lymphs Abs: 2.3
Monocytes Relative: 10
Neutrophils Relative %: 64

## 2011-01-22 LAB — BASIC METABOLIC PANEL
Chloride: 99
GFR calc Af Amer: >60
Potassium: 5

## 2011-01-22 LAB — PROTIME-INR: INR: 1

## 2011-01-24 ENCOUNTER — Ambulatory Visit (INDEPENDENT_AMBULATORY_CARE_PROVIDER_SITE_OTHER): Payer: Medicare Other | Admitting: *Deleted

## 2011-01-24 ENCOUNTER — Ambulatory Visit (HOSPITAL_COMMUNITY)
Admission: RE | Admit: 2011-01-24 | Discharge: 2011-01-24 | Disposition: A | Discharge status: Home / Self Care | Payer: Medicare Other | Source: Ambulatory Visit | Attending: Internal Medicine | Admitting: Internal Medicine

## 2011-01-24 DIAGNOSIS — IMO0001 Reserved for inherently not codable concepts without codable children: Secondary | ICD-10-CM | POA: Insufficient documentation

## 2011-01-24 DIAGNOSIS — Z7901 Long term (current) use of anticoagulants: Secondary | ICD-10-CM

## 2011-01-24 DIAGNOSIS — L97309 Non-pressure chronic ulcer of unspecified ankle with unspecified severity: Secondary | ICD-10-CM | POA: Insufficient documentation

## 2011-01-24 DIAGNOSIS — I4891 Unspecified atrial fibrillation: Secondary | ICD-10-CM

## 2011-01-24 LAB — POCT INR: INR: 4.5

## 2011-01-24 NOTE — Progress Notes (Signed)
Physical Therapy - Wound Therapy  Patient Details  Name: [name] MRN: [id number] Date of Birth: April 16, 1940  Today's Date: 01/24/2011 Time: 5784-6962 Time Calculation (min): 27 min Visit#: 86 Re-eval: 02/21/11 Charge: selective debridement < 20 cm Wound Therapy Wound 05/04/10 Abrasion(s);Other (Comment) Ankle Left;Lateral scratch that turned into ulcer; 2.0cmX1cmX0.25cm with 95% slough and 5% granulation (Active)  Site / Wound Assessment Clean;Dry 01/24/2011  9:00 AM  % Wound base Black 0% 11/03/2010  8:00 AM  Peri-wound Assessment Erythema (blanchable);Bleeding 11/06/2010  8:00 AM  Wound Length (cm) 2.1 cm 01/24/2011  9:00 AM  Wound Width (cm) 1.9 cm 01/24/2011  9:00 AM  Wound Depth (cm) 0.6 cm 01/24/2011  9:00 AM  Margins Other (Comment) 10/23/2010  8:00 AM  Drainage Amount Minimal 01/24/2011  9:00 AM  Drainage Description Sanguineous 01/24/2011  9:00 AM  Non-staged Wound Description Not applicable 11/08/2010  8:00 AM  Treatment Cleansed;Debridement (Selective) 01/24/2011  9:00 AM  Dressing Type Hydrogel 01/24/2011  9:00 AM  Dressing Changed New 01/24/2011  9:00 AM  Dressing Status Clean;Dry;Intact 01/24/2011  9:00 AM       Physical Therapy Assessment and Plan Wound Therapy - Assess/Plan/Recommendations Wound Therapy - Clinical Statement: wound same length, increased width and depth following 3 week vacation Wound Therapy - Functional Problem List: Vascular compromise Factors Delaying/Impairing Wound Healing: Vascular compromise Hydrotherapy Plan: Debridement;Dressing change PT Assessment and Plan Clinical Impression Statement: Wound has increased in width and depth but same length following 3 week vacation.  Pt tolerated well towards manual therapy debridement with no c/o of pain.  Pt had coumadin appointment following wound treatment today so not available for estim hi volt today. PT Plan: Begin hi volt estim and continue wound debridement/irrigation treatment next session.      Goals    Problem List Patient Active Problem List  Diagnoses  . HYPERLIPIDEMIA  . HYPERTENSION  . ATRIAL FIBRILLATION  . ACUTE ON CHRONIC DIASTOLIC HEART FAILURE  . Occlusion and stenosis of carotid artery without mention of cerebral infarction  . SYNCOPE AND COLLAPSE  . Long term current use of anticoagulant  . Wound of ankle    [name] 01/24/2011, 10:06 AM

## 2011-01-25 ENCOUNTER — Ambulatory Visit (HOSPITAL_COMMUNITY): Payer: Medicare Other

## 2011-01-29 ENCOUNTER — Ambulatory Visit (HOSPITAL_COMMUNITY)
Admission: RE | Admit: 2011-01-29 | Discharge: 2011-01-29 | Disposition: A | Discharge status: Home / Self Care | Payer: Medicare Other | Source: Ambulatory Visit | Attending: Internal Medicine | Admitting: Internal Medicine

## 2011-01-29 NOTE — Progress Notes (Signed)
Physical Therapy - Wound Therapy  Patient Details  Name: [name] MRN: [id number] Date of Birth: 1940-06-29  Today's Date: 01/29/2011 Time: 1478-2956 Time Calculation (min): 96 min Visit#: 87 Re-eval: 02/21/11 Charges: Selective debridement (= or < 20 cm)   Wound Therapy Wound 05/04/10 Abrasion(s);Other (Comment) Ankle Left;Lateral scratch that turned into ulcer; 2.0cmX1cmX0.25cm with 95% slough and 5% granulation (Active)  Site / Wound Assessment Clean;Dry 01/29/2011  9:00 AM  % Wound base Black 0% 11/03/2010  8:00 AM  Peri-wound Assessment Erythema (blanchable);Bleeding 11/06/2010  8:00 AM  Wound Length (cm) 2.1 cm 01/24/2011  9:00 AM  Wound Width (cm) 1.9 cm 01/24/2011  9:00 AM  Wound Depth (cm) 0.6 cm 01/24/2011  9:00 AM  Margins Other (Comment) 10/23/2010  8:00 AM  Drainage Amount Minimal 01/29/2011  9:00 AM  Drainage Description Sanguineous 01/29/2011  9:00 AM  Non-staged Wound Description Not applicable 11/08/2010  8:00 AM  Treatment Cleansed;Debridement (Selective) 01/29/2011  9:00 AM  Dressing Type Hydrogel 01/29/2011  9:00 AM  Dressing Changed New 01/29/2011  9:00 AM  Dressing Status Clean;Dry;Intact 01/29/2011  9:00 AM     Physical Therapy Assessment and Plan Wound Therapy - Assess/Plan/Recommendations Wound Therapy - Functional Problem List: Vascular compromise Factors Delaying/Impairing Wound Healing: Vascular compromise Hydrotherapy Plan: Debridement;Dressing change PT Assessment and Plan Clinical Impression Statement: Hi volt estim applied to wound with positive polarity. Woun with increased granulation at end of session. Wound without signs of infection. PT Plan: Continue with PT POC. Begin positive polarity tx #2 next tx.      Problem List Patient Active Problem List  Diagnoses  . HYPERLIPIDEMIA  . HYPERTENSION  . ATRIAL FIBRILLATION  . ACUTE ON CHRONIC DIASTOLIC HEART FAILURE  . Occlusion and stenosis of carotid artery without mention of cerebral  infarction  . SYNCOPE AND COLLAPSE  . Long term current use of anticoagulant  . Wound of ankle    [name] 01/29/2011, 11:11 AM

## 2011-02-01 ENCOUNTER — Ambulatory Visit (HOSPITAL_COMMUNITY)
Admission: RE | Admit: 2011-02-01 | Discharge: 2011-02-01 | Disposition: A | Discharge status: Home / Self Care | Payer: Medicare Other | Source: Ambulatory Visit | Attending: Internal Medicine | Admitting: Internal Medicine

## 2011-02-01 NOTE — Progress Notes (Signed)
Physical Therapy - Wound Therapy  Patient Details  Name: [name] MRN: [id number] Date of Birth: 1940-06-12  Today's Date: 02/01/2011 Time: 4098-1191 Time Calculation (min): 85 min Visit#: 88 Re-eval: 02/21/11 Charge: selective debridement <20 cm- 1 unit estim hi volt positive polarity #2 - 1 unit Subjective: no pain today Wound Therapy Wound 05/04/10 Abrasion(s);Other (Comment) Ankle Left;Lateral scratch that turned into ulcer; 2.0cmX1cmX0.25cm with 95% slough and 5% granulation (Active)  Site / Wound Assessment Clean;Dry 02/01/2011  9:00 AM  % Wound base Black 0% 11/03/2010  8:00 AM  Peri-wound Assessment Erythema (blanchable);Bleeding 11/06/2010  8:00 AM  Wound Length (cm) 2.1 cm 01/24/2011  9:00 AM  Wound Width (cm) 1.9 cm 01/24/2011  9:00 AM  Wound Depth (cm) 0.6 cm 01/24/2011  9:00 AM  Margins Other (Comment) 10/23/2010  8:00 AM  Drainage Amount Minimal 02/01/2011  9:00 AM  Drainage Description Sanguineous 02/01/2011  9:00 AM  Non-staged Wound Description Not applicable 11/08/2010  8:00 AM  Treatment Cleansed;Debridement (Selective);Electrical stimulation 02/01/2011  9:00 AM  Dressing Type Hydrogel 02/01/2011  9:00 AM  Dressing Changed New 02/01/2011  9:00 AM  Dressing Status Clean;Dry;Intact 02/01/2011  9:00 AM   Electrical Stimulation Electrical Stimulation - Location: L lateral ankle Electrical Stimulation - Polarity: Positive #2 Electrical Stimulation - Pulse Rate: Continuous Electrical Stimulation - Intensity: 150 mV Electrical Stimulation - Duration: 60 minutes   Physical Therapy Assessment and Plan Wound Therapy - Assess/Plan/Recommendations Wound Therapy - Clinical Statement: wound with increased erythema around border, no sign of infection.  Pt tolerated debridement well with no c/o of pain. Wound Therapy - Functional Problem List: Vascular compromise Factors Delaying/Impairing Wound Healing: Vascular compromise Hydrotherapy Plan: Debridement;Dressing  change PT Assessment and Plan Clinical Impression Statement: wound with increased erythema around border, no heat or other sign of infection. Pt tolerated debridement well with no c/o of pain. Hi volt estim applied with positive polarity 2# for 60 minutes.  Able to remove excess slough following esim with base 35% yellow slough remaining.   PT Plan: Continue with PT POC. Continue high volt positive polarity x 1 more session then switch to negative.polarity     Goals    Problem List Patient Active Problem List  Diagnoses  . HYPERLIPIDEMIA  . HYPERTENSION  . ATRIAL FIBRILLATION  . ACUTE ON CHRONIC DIASTOLIC HEART FAILURE  . Occlusion and stenosis of carotid artery without mention of cerebral infarction  . SYNCOPE AND COLLAPSE  . Long term current use of anticoagulant  . Wound of ankle    [name] 02/01/2011, 11:41 AM

## 2011-02-05 ENCOUNTER — Ambulatory Visit (INDEPENDENT_AMBULATORY_CARE_PROVIDER_SITE_OTHER): Payer: Medicare Other | Admitting: *Deleted

## 2011-02-05 ENCOUNTER — Ambulatory Visit (HOSPITAL_COMMUNITY)
Admission: RE | Admit: 2011-02-05 | Discharge: 2011-02-05 | Disposition: A | Discharge status: Home / Self Care | Payer: Medicare Other | Source: Ambulatory Visit | Attending: Internal Medicine | Admitting: Internal Medicine

## 2011-02-05 DIAGNOSIS — I4891 Unspecified atrial fibrillation: Secondary | ICD-10-CM

## 2011-02-05 DIAGNOSIS — Z7901 Long term (current) use of anticoagulants: Secondary | ICD-10-CM

## 2011-02-05 LAB — POCT INR: INR: 3.4

## 2011-02-05 NOTE — Progress Notes (Signed)
Physical Therapy - Wound Therapy  Patient Details  Name: [name] MRN: [id number] Date of Birth: Aug 24, 1940  Today's Date: 02/05/2011 Time: 4098-1191 Time Calculation (min): 90 min Visit#: 89 Re-eval: 02/21/11 Charge: selective debridement < 20 cm Wound Therapy Wound 05/04/10 Abrasion(s);Other (Comment) Ankle Left;Lateral scratch that turned into ulcer; 2.0cmX1cmX0.25cm with 95% slough and 5% granulation (Active)  Site / Wound Assessment Clean;Dry 02/05/2011 10:00 AM  % Wound base Red or Granulating 75% 02/05/2011 10:00 AM  % Wound base Yellow 25% 02/05/2011 10:00 AM  % Wound base Black 0% 11/03/2010  8:00 AM  Peri-wound Assessment Erythema (blanchable);Bleeding 11/06/2010  8:00 AM  Wound Length (cm) 2.1 cm 01/24/2011  9:00 AM  Wound Width (cm) 1.9 cm 01/24/2011  9:00 AM  Wound Depth (cm) 0.6 cm 01/24/2011  9:00 AM  Margins Other (Comment) 10/23/2010  8:00 AM  Drainage Amount Minimal 02/05/2011 10:00 AM  Drainage Description Sanguineous 02/05/2011 10:00 AM  Non-staged Wound Description Not applicable 11/08/2010  8:00 AM  Treatment Cleansed;Debridement (Selective);Electrical stimulation 02/05/2011 10:00 AM  Dressing Type Hydrogel 02/05/2011 10:00 AM  Dressing Changed New 02/05/2011 10:00 AM  Dressing Status Clean;Dry;Intact 02/05/2011 10:00 AM   Electrical Stimulation Electrical Stimulation - Location: L lateral ankle Electrical Stimulation - Polarity: Positive #3 Electrical Stimulation - Pulse Rate: Continuous Electrical Stimulation - Intensity: 150 mV Electrical Stimulation - Duration: 60 minutes   Physical Therapy Assessment and Plan Wound Therapy - Assess/Plan/Recommendations Wound Therapy - Clinical Statement: wound with increased erythema around border, no sign of infection.  Wound Therapy - Functional Problem List: Vascular compromise Factors Delaying/Impairing Wound Healing: Vascular compromise Hydrotherapy Plan: Debridement;Dressing change PT Assessment and  Plan Clinical Impression Statement: Pt with increased sensivity with debridement today.  Hi volt estim applied with posivite polartiy #3 for 60 minutes.   Able to remove excess slough folllowing estim with base 25% yellow slough remaining.   PT Plan: Switch to negative polarite next session.    Goals    Problem List Patient Active Problem List  Diagnoses  . HYPERLIPIDEMIA  . HYPERTENSION  . ATRIAL FIBRILLATION  . ACUTE ON CHRONIC DIASTOLIC HEART FAILURE  . Occlusion and stenosis of carotid artery without mention of cerebral infarction  . SYNCOPE AND COLLAPSE  . Long term current use of anticoagulant  . Wound of ankle    [name] 02/05/2011, 10:23 AM

## 2011-02-05 NOTE — Progress Notes (Signed)
Physical Therapy - Wound Therapy  Patient Details  Name: [name] MRN: [id number] Date of Birth: Nov 15, 1940  Today's Date: 02/05/2011 Time: 4098-1191 Time Calculation (min): 90 min Visit#: 89 Re-eval: 02/21/11 Charge: selective debridement < 20 cm Wound Therapy Wound 05/04/10 Abrasion(s);Other (Comment) Ankle Left;Lateral scratch that turned into ulcer; 2.0cmX1cmX0.25cm with 95% slough and 5% granulation (Active)  Site / Wound Assessment Clean;Dry 02/05/2011 10:00 AM  % Wound base Red or Granulating 75% 02/05/2011 10:00 AM  % Wound base Yellow 25% 02/05/2011 10:00 AM  % Wound base Black 0% 11/03/2010  8:00 AM  Peri-wound Assessment Erythema (blanchable);Bleeding 11/06/2010  8:00 AM  Wound Length (cm) 2.1 cm 01/24/2011  9:00 AM  Wound Width (cm) 1.9 cm 01/24/2011  9:00 AM  Wound Depth (cm) 0.6 cm 01/24/2011  9:00 AM  Margins Other (Comment) 10/23/2010  8:00 AM  Drainage Amount Minimal 02/05/2011 10:00 AM  Drainage Description Sanguineous 02/05/2011 10:00 AM  Non-staged Wound Description Not applicable 11/08/2010  8:00 AM  Treatment Cleansed;Debridement (Selective);Electrical stimulation 02/05/2011 10:00 AM  Dressing Type Hydrogel 02/05/2011 10:00 AM  Dressing Changed New 02/05/2011 10:00 AM  Dressing Status Clean;Dry;Intact 02/05/2011 10:00 AM       Physical Therapy Assessment and Plan   PT Assessment and Plan Clinical Impression Statement: Pt with increased sensivity with debridement today.  Hi volt estim applied with posivite polartiy #3 for 60 minutes.   Able to remove excess slough folllowing estim with base 25% yellow slough remaining.   PT Plan: Switch to negative polarite next session.    Goals    Problem List Patient Active Problem List  Diagnoses  . HYPERLIPIDEMIA  . HYPERTENSION  . ATRIAL FIBRILLATION  . ACUTE ON CHRONIC DIASTOLIC HEART FAILURE  . Occlusion and stenosis of carotid artery without mention of cerebral infarction  . SYNCOPE AND COLLAPSE  . Long  term current use of anticoagulant  . Wound of ankle    [name] 02/05/2011, 2:13 PM

## 2011-02-08 ENCOUNTER — Ambulatory Visit (HOSPITAL_COMMUNITY)
Admission: RE | Admit: 2011-02-08 | Discharge: 2011-02-08 | Disposition: A | Discharge status: Home / Self Care | Payer: Medicare Other | Source: Ambulatory Visit | Attending: Internal Medicine | Admitting: Internal Medicine

## 2011-02-08 DIAGNOSIS — IMO0001 Reserved for inherently not codable concepts without codable children: Secondary | ICD-10-CM | POA: Insufficient documentation

## 2011-02-08 DIAGNOSIS — L97309 Non-pressure chronic ulcer of unspecified ankle with unspecified severity: Secondary | ICD-10-CM | POA: Insufficient documentation

## 2011-02-08 NOTE — Progress Notes (Signed)
Physical Therapy - Wound Therapy  Patient Details  Name: [name] MRN: [id number] Date of Birth: 09-Dec-1940  Today's Date: 02/08/2011 Time: 4132-4401 Time Calculation (min): 90 min Visit#: 90 Re-eval: 02/21/11 Charge: selective debridement <20 cm Estim x 1 unit  Wound Therapy Wound 05/04/10 Abrasion(s);Other (Comment) Ankle Left;Lateral scratch that turned into ulcer; 2.0cmX1cmX0.25cm with 95% slough and 5% granulation (Active)  Site / Wound Assessment Clean;Dry 02/08/2011 12:00 PM  % Wound base Red or Granulating 75% 02/05/2011 10:00 AM  % Wound base Yellow 25% 02/05/2011 10:00 AM  % Wound base Black 0% 11/03/2010  8:00 AM  Peri-wound Assessment Erythema (blanchable);Bleeding 11/06/2010  8:00 AM  Wound Length (cm) 2.1 cm 01/24/2011  9:00 AM  Wound Width (cm) 1.9 cm 01/24/2011  9:00 AM  Wound Depth (cm) 0.6 cm 01/24/2011  9:00 AM  Margins Other (Comment) 10/23/2010  8:00 AM  Drainage Amount Moderate 02/08/2011 12:00 PM  Drainage Description Sanguineous 02/08/2011 12:00 PM  Non-staged Wound Description Not applicable 11/08/2010  8:00 AM  Treatment Cleansed;Debridement (Selective);Electrical stimulation 02/08/2011 12:00 PM  Dressing Type Hydrogel 02/08/2011 12:00 PM  Dressing Changed New 02/08/2011 12:00 PM  Dressing Status Clean;Dry;Intact 02/08/2011 12:00 PM   Electrical Stimulation Electrical Stimulation - Location: L lateral ankle Electrical Stimulation - Polarity: Negative #1 Electrical Stimulation - Pulse Rate: Continuous Electrical Stimulation - Intensity: 150 mV Electrical Stimulation - Duration: 60 minutes   Physical Therapy Assessment and Plan Wound Therapy - Assess/Plan/Recommendations Wound Therapy - Clinical Statement: wound with increased erythema around border, dead skin was removed previous session, no sign of infection.  Wound Therapy - Functional Problem List: Vascular compromise Factors Delaying/Impairing Wound Healing: Vascular compromise Hydrotherapy Plan:  Debridement;Dressing change PT Assessment and Plan Clinical Impression Statement: Pt with increased anxiety at beginning of session, prior debridement.  Moderate amount of yellow slough at entrance.  Pt tolerated treatment well with no c/o increased pain during debridement.  Hi volt negative polarity #1, continue x 2 more sessions. PT Plan: Continue with negative polarity x 2 more session.    Goals    Problem List Patient Active Problem List  Diagnoses  . HYPERLIPIDEMIA  . HYPERTENSION  . ATRIAL FIBRILLATION  . ACUTE ON CHRONIC DIASTOLIC HEART FAILURE  . Occlusion and stenosis of carotid artery without mention of cerebral infarction  . SYNCOPE AND COLLAPSE  . Long term current use of anticoagulant  . Wound of ankle    [name] 02/08/2011, 12:02 PM

## 2011-02-12 ENCOUNTER — Ambulatory Visit (HOSPITAL_COMMUNITY)
Admission: RE | Admit: 2011-02-12 | Discharge: 2011-02-12 | Disposition: A | Discharge status: Home / Self Care | Payer: Medicare Other | Source: Ambulatory Visit | Attending: Internal Medicine | Admitting: Internal Medicine

## 2011-02-12 NOTE — Progress Notes (Signed)
Physical Therapy - Wound Therapy  Patient Details  Name: [name] MRN: [id number] Date of Birth: 1940-08-15  Today's Date: 02/12/2011 Time: 1308-6578 Time Calculation (min): 90 min Visit#: 91 Re-eval: 02/21/11 Charges:  Debridement <20cm, estim 1 unit  Wound Therapy Wound 05/04/10 Abrasion(s);Other (Comment) Ankle Left;Lateral scratch that turned into ulcer; 2.0cmX1cmX0.25cm with 95% slough and 5% granulation (Active)  Site / Wound Assessment Clean;Dry 02/12/2011  9:00 AM  % Wound base Red or Granulating 75% 02/12/2011  9:00 AM  % Wound base Yellow 25% 02/12/2011  9:00 AM  % Wound base Black 0% 11/03/2010  8:00 AM  Peri-wound Assessment Erythema (blanchable);Bleeding 11/06/2010  8:00 AM  Wound Length (cm) 2.1 cm 01/24/2011  9:00 AM  Wound Width (cm) 1.9 cm 01/24/2011  9:00 AM  Wound Depth (cm) 0.6 cm 01/24/2011  9:00 AM  Margins Other (Comment) 10/23/2010  8:00 AM  Drainage Amount Minimal 02/12/2011  9:00 AM  Drainage Description Sanguineous 02/12/2011  9:00 AM  Non-staged Wound Description Not applicable 11/08/2010  8:00 AM  Treatment Cleansed;Debridement (Selective);Electrical stimulation 02/12/2011  9:00 AM  Dressing Type Hydrogel 02/12/2011  9:00 AM  Dressing Changed New 02/12/2011  9:00 AM  Dressing Status Clean;Dry;Intact 02/12/2011  9:00 AM   Electrical Stimulation L lateral ankle, negative polarity (RX #2), continuous at 150 mV for 60 minutes.  Physical Therapy Assessment and Plan Wound Therapy - Assess/Plan/Recommendations Wound Therapy - Clinical Statement: Pt. with continued sensitivity, jerking and shaking with debridement.  Overall improving in granulation/closure. Wound Therapy - Functional Problem List: Vascular compromise Factors Delaying/Impairing Wound Healing: Vascular compromise Hydrotherapy Plan: Debridement;Dressing change PT Assessment and Plan Clinical Impression Statement: Pt. with increased sensitivity/anxiety during debridment.  Difficulty to debride secondary  to jerky movements.  Wound initially covered 100% with slough but able to remove 75% of slough.  Received hi volt, negative polarity (RX #2) at . PT Plan: Continue with negative polarity X 1 more session.     Problem List Patient Active Problem List  Diagnoses  . HYPERLIPIDEMIA  . HYPERTENSION  . ATRIAL FIBRILLATION  . ACUTE ON CHRONIC DIASTOLIC HEART FAILURE  . Occlusion and stenosis of carotid artery without mention of cerebral infarction  . SYNCOPE AND COLLAPSE  . Long term current use of anticoagulant  . Wound of ankle    Lurena Nida 02/12/2011, 9:29 AM

## 2011-02-15 ENCOUNTER — Ambulatory Visit (HOSPITAL_COMMUNITY)
Admission: RE | Admit: 2011-02-15 | Discharge: 2011-02-15 | Disposition: A | Discharge status: Home / Self Care | Payer: Medicare Other | Source: Ambulatory Visit | Attending: Internal Medicine | Admitting: Internal Medicine

## 2011-02-15 NOTE — Progress Notes (Signed)
Physical Therapy - Wound Therapy  Patient Details  Name: [name] MRN: [id number] Date of Birth: 09/03/1940  Today's Date: 02/15/2011 Time: 7829-5621 Time Calculation (min): 86 min Charges: Deb <20 cm, 1 e-stim Visit#: 91  Re-eval: 02/21/11  Wound Therapy Wound 05/04/10 Abrasion(s);Other (Comment) Ankle Left;Lateral scratch that turned into ulcer; 2.0cmX1cmX0.25cm with 95% slough and 5% granulation (Active)  Site / Wound Assessment Clean;Dry 02/15/2011  8:00 AM  % Wound base Red or Granulating 75% 02/12/2011  9:00 AM  % Wound base Yellow 25% 02/12/2011  9:00 AM  % Wound base Black 0% 11/03/2010  8:00 AM  Peri-wound Assessment Erythema (blanchable);Bleeding 11/06/2010  8:00 AM  Wound Length (cm) 2.1 cm 01/24/2011  9:00 AM  Wound Width (cm) 1.9 cm 01/24/2011  9:00 AM  Wound Depth (cm) 0.6 cm 01/24/2011  9:00 AM  Margins Other (Comment) 10/23/2010  8:00 AM  Drainage Amount Minimal 02/15/2011  8:00 AM  Drainage Description Sanguineous 02/15/2011  8:00 AM  Non-staged Wound Description Not applicable 11/08/2010  8:00 AM  Treatment Cleansed;Debridement (Selective);Electrical stimulation;Ice applied;Tape changed 02/15/2011  8:00 AM  Dressing Type Hydrogel 02/15/2011  8:00 AM  Dressing Changed New 02/15/2011  8:00 AM  Dressing Status Clean;Dry;Intact 02/15/2011  8:00 AM   Electrical Stimulation Electrical Stimulation - Location: L lateral ankle Electrical Stimulation - Polarity: Negative #3 Electrical Stimulation - Pulse Rate: Continuous Electrical Stimulation - Intensity: 150 mV Electrical Stimulation - Duration: 60 minutes   Physical Therapy Assessment and Plan Wound Therapy - Assess/Plan/Recommendations Wound Therapy - Clinical Statement: Pt tolerated increased debridment to her distal wound bed after 5 minutes of ice massage applied.  She did not feel the debridment.  Had improved granulation and decreased slough after debridment and e-stim.  Continue with ice massage to wound bed before  debridment for improved tolerance.  Wound Therapy - Functional Problem List: Vascular compromise Factors Delaying/Impairing Wound Healing: Vascular compromise Hydrotherapy Plan: Debridement;Dressing change;Electrical stimulation;Patient/family education PT Assessment and Plan PT Plan: Cont with ice massage to wound bed.  Start positive polarity next visit.    Goals    Problem List Patient Active Problem List  Diagnoses  . HYPERLIPIDEMIA  . HYPERTENSION  . ATRIAL FIBRILLATION  . ACUTE ON CHRONIC DIASTOLIC HEART FAILURE  . Occlusion and stenosis of carotid artery without mention of cerebral infarction  . SYNCOPE AND COLLAPSE  . Long term current use of anticoagulant  . Wound of ankle    [name] 02/15/2011, 8:36 AM

## 2011-02-19 ENCOUNTER — Ambulatory Visit (HOSPITAL_COMMUNITY)
Admission: RE | Admit: 2011-02-19 | Discharge: 2011-02-19 | Disposition: A | Discharge status: Home / Self Care | Payer: Medicare Other | Source: Ambulatory Visit | Attending: Internal Medicine | Admitting: Internal Medicine

## 2011-02-19 NOTE — Progress Notes (Addendum)
Physical Therapy - Wound Therapy  Patient Details  Name: [name] MRN: [id number] Date of Birth: 10/15/1940  Today's Date: 02/19/2011 Time: 0800-0925 Time Calculation (min): 85 min Charges: Deb < 20 cm, 1 e-stim, 1 ice Visit#: 93 Re-eval: 02/21/11  Wound Therapy Wound 05/04/10 Abrasion(s);Other (Comment) Ankle Left;Lateral scratch that turned into ulcer; 2.0cmX1cmX0.25cm with 95% slough and 5% granulation (Active)  Site / Wound Assessment Clean;Dry 02/19/2011  8:00 AM  % Wound base Red or Granulating 75% 02/12/2011  9:00 AM  % Wound base Yellow 25% 02/12/2011  9:00 AM  % Wound base Black 0% 11/03/2010  8:00 AM  Peri-wound Assessment Erythema (blanchable);Bleeding 11/06/2010  8:00 AM  Wound Length (cm) 2.1 cm 01/24/2011  9:00 AM  Wound Width (cm) 1.9 cm 01/24/2011  9:00 AM  Wound Depth (cm) 0.6 cm 01/24/2011  9:00 AM  Margins Other (Comment) 10/23/2010  8:00 AM  Drainage Amount Minimal 02/19/2011  8:00 AM  Drainage Description Sanguineous 02/19/2011  8:00 AM  Non-staged Wound Description Not applicable 02/19/2011  8:00 AM  Treatment Cleansed;Debridement (Selective);Electrical stimulation;Ice applied;Tape changed 02/19/2011  8:00 AM  Dressing Type Hydrogel;Moist to moist;Tape dressing 02/19/2011  8:00 AM  Dressing Changed New 02/19/2011  8:00 AM  Dressing Status Clean;Dry;Intact 02/19/2011  8:00 AM   Electrical Stimulation Electrical Stimulation - Location: L lateral ankle Electrical Stimulation - Polarity: Positive #1 Electrical Stimulation - Pulse Rate: Continuous Electrical Stimulation - Intensity: 150 mV Electrical Stimulation - Duration: 60 minutes   Physical Therapy Assessment and Plan Wound Therapy - Assess/Plan/Recommendations Wound Therapy - Clinical Statement: Pt continues to show improvement with decreased depth and improved outer structures.  She has improved granualation that is evident increased redness inside the wound. Wound Therapy - Functional Problem List:  Vascular compromise Factors Delaying/Impairing Wound Healing: Vascular compromise Hydrotherapy Plan: Debridement;Dressing change;Electrical stimulation;Patient/family education PT Assessment and Plan PT Plan: Measurments next visit.  Continue with ice to the wound bed before debridment to decrease overall pain.     Goals    Problem List Patient Active Problem List  Diagnoses  . HYPERLIPIDEMIA  . HYPERTENSION  . ATRIAL FIBRILLATION  . ACUTE ON CHRONIC DIASTOLIC HEART FAILURE  . Occlusion and stenosis of carotid artery without mention of cerebral infarction  . SYNCOPE AND COLLAPSE  . Long term current use of anticoagulant  . Wound of ankle    Elice Crigger 02/19/2011, 8:31 AM

## 2011-02-20 ENCOUNTER — Telehealth: Payer: Self-pay | Admitting: Cardiovascular Disease

## 2011-02-20 NOTE — Telephone Encounter (Signed)
Amiodarone, Welchol refills needed, [phone] [facility]

## 2011-02-21 MED ORDER — COLESEVELAM HCL 625 MG PO TABS: 1875.0000 mg | ORAL_TABLET | Freq: Three times a day (TID) | ORAL | Status: DC

## 2011-02-21 MED ORDER — AMIODARONE HCL 200 MG PO TABS: 200.0000 mg | ORAL_TABLET | Freq: Two times a day (BID) | ORAL | Status: DC

## 2011-02-22 ENCOUNTER — Ambulatory Visit (HOSPITAL_COMMUNITY)
Admission: RE | Admit: 2011-02-22 | Discharge: 2011-02-22 | Disposition: A | Discharge status: Home / Self Care | Payer: Medicare Other | Source: Ambulatory Visit | Attending: Internal Medicine | Admitting: Internal Medicine

## 2011-02-22 NOTE — Progress Notes (Signed)
Physical Therapy - Wound Therapy  Patient Details  Name: [name] MRN: [id number] Date of Birth: [birth year]  Today's Date: 02/22/2011 Time:  4098-1191 Visit#:94 Re-eval: 03/21/11 Charge: selective debridement <20 cm: 1 unit Hi volt estim: 45 min   Wound Therapy Wound 05/04/10 Abrasion(s);Other (Comment) Ankle Left;Lateral scratch that turned into ulcer; 2.0cmX1cmX0.25cm with 95% slough and 5% granulation (Active)  Site / Wound Assessment Clean;Dry 02/22/2011  8:00 AM  % Wound base Red or Granulating 75% 02/22/2011  8:00 AM  % Wound base Yellow 25% 02/22/2011  8:00 AM  % Wound base Black 0% 11/03/2010  8:00 AM  Peri-wound Assessment Erythema (blanchable);Bleeding 11/06/2010  8:00 AM  Wound Length (cm) 1.5 cm 02/22/2011  8:00 AM  Wound Width (cm) 1 cm 02/22/2011  8:00 AM  Wound Depth (cm) 0.25 cm 02/22/2011  8:00 AM  Margins Other (Comment) 10/23/2010  8:00 AM  Drainage Amount Minimal 02/22/2011  8:00 AM  Drainage Description Sanguineous 02/22/2011  8:00 AM  Non-staged Wound Description Not applicable 02/19/2011  8:00 AM  Treatment Cleansed;Debridement (Selective);Electrical stimulation 02/22/2011  8:00 AM  Dressing Type Hydrogel;Moist to moist 02/22/2011  8:00 AM  Dressing Changed New 02/22/2011  8:00 AM  Dressing Status Clean;Dry;Intact 02/22/2011  8:00 AM       Physical Therapy Assessment and Plan A: Pt with anxiety during debridment with increased jerky movement, husband helped to stabilize. Pt made no c/o of pain during session. Reassessment measurements complete with decrese in width, length and depth.  P: Continue wound care, continue with ice to wound bed prior debridement to decrease pain   Goals    Problem List Patient Active Problem List  Diagnoses  . HYPERLIPIDEMIA  . HYPERTENSION  . ATRIAL FIBRILLATION  . ACUTE ON CHRONIC DIASTOLIC HEART FAILURE  . Occlusion and stenosis of carotid artery without mention of cerebral infarction  . SYNCOPE AND COLLAPSE  .  Long term current use of anticoagulant  . Wound of ankle    [name] 02/22/2011, 12:51 PM

## 2011-02-26 ENCOUNTER — Ambulatory Visit (INDEPENDENT_AMBULATORY_CARE_PROVIDER_SITE_OTHER): Payer: Medicare Other | Admitting: *Deleted

## 2011-02-26 ENCOUNTER — Ambulatory Visit (HOSPITAL_COMMUNITY)
Admission: RE | Admit: 2011-02-26 | Discharge: 2011-02-26 | Disposition: A | Discharge status: Home / Self Care | Payer: Medicare Other | Source: Ambulatory Visit | Attending: Internal Medicine | Admitting: Internal Medicine

## 2011-02-26 DIAGNOSIS — I4891 Unspecified atrial fibrillation: Secondary | ICD-10-CM

## 2011-02-26 DIAGNOSIS — Z7901 Long term (current) use of anticoagulants: Secondary | ICD-10-CM

## 2011-02-26 LAB — POCT INR: INR: 2.9

## 2011-02-26 NOTE — Progress Notes (Signed)
Physical Therapy - Wound Therapy  Patient Details  Name: [name] MRN: [id number] Date of Birth: 19-Oct-1940  Today's Date: 02/26/2011 Time: 4098-1191 Time Calculation (min): 86 min Deb < 20 cm, 1 estim Visit#: 94 Re-eval: 03/21/11  Wound Therapy Wound 05/04/10 Abrasion(s);Other (Comment) Ankle Left;Lateral scratch that turned into ulcer; 2.0cmX1cmX0.25cm with 95% slough and 5% granulation (Active)  Site / Wound Assessment Clean;Dry 02/26/2011 11:00 AM  % Wound base Red or Granulating 80 02/26/2011  8:00 AM  % Wound base Yellow 20% 02/26/2011  8:00 AM  % Wound base Black 0% 11/03/2010  8:00 AM  Peri-wound Assessment Erythema (blanchable);Bleeding 11/06/2010  8:00 AM  Wound Length (cm) 1.5 cm 02/22/2011  8:00 AM  Wound Width (cm) 1 cm 02/22/2011  8:00 AM  Wound Depth (cm) 0.25 cm 02/22/2011  8:00 AM  Margins Other (Comment) 10/23/2010  8:00 AM  Drainage Amount Minimal 02/26/2011 11:00 AM  Drainage Description Sanguineous 02/26/2011 11:00 AM  Non-staged Wound Description Not applicable 02/26/2011 11:00 AM  Treatment Cleansed;Debridement (Selective);Electrical stimulation 02/26/2011 11:00 AM  Dressing Type Hydrogel;Moist to moist 02/26/2011 11:00 AM  Dressing Changed New 02/26/2011 11:00 AM  Dressing Status Clean;Dry;Intact 02/26/2011 11:00 AM  Ice Massage prior to debridment Museum/gallery exhibitions officer Stimulation - Location: L lateral ankle Electrical Stimulation - Polarity: Positive #3 Electrical Stimulation - Pulse Rate: Continuous Electrical Stimulation - Intensity: 150 mV Electrical Stimulation - Duration: 60 minutes   Physical Therapy Assessment and Plan Wound Therapy - Assess/Plan/Recommendations Wound Therapy - Clinical Statement: Cont to have decrease in overall deth and improved border.  Discussed leaving bandage on until Wedensday to promote healing.  Wound Therapy - Functional Problem List: Vascular compromise Factors Delaying/Impairing Wound Healing:  Vascular compromise Hydrotherapy Plan: Debridement;Dressing change;Electrical stimulation;Patient/family education PT Assessment and Plan Clinical Impression Statement: Cont to have decrease in overall deth and improved border.  Discussed leaving bandage on until Wedensday to promote healing.  PT Plan: Cont with wound care.     Goals    Problem List Patient Active Problem List  Diagnoses  . HYPERLIPIDEMIA  . HYPERTENSION  . ATRIAL FIBRILLATION  . ACUTE ON CHRONIC DIASTOLIC HEART FAILURE  . Occlusion and stenosis of carotid artery without mention of cerebral infarction  . SYNCOPE AND COLLAPSE  . Long term current use of anticoagulant  . Wound of ankle    [name] 02/26/2011, 11:47 AM

## 2011-02-27 ENCOUNTER — Ambulatory Visit (HOSPITAL_COMMUNITY): Payer: Medicare Other

## 2011-02-28 ENCOUNTER — Telehealth: Payer: Self-pay | Admitting: Cardiovascular Disease

## 2011-02-28 ENCOUNTER — Ambulatory Visit (HOSPITAL_COMMUNITY)
Admission: RE | Admit: 2011-02-28 | Discharge: 2011-02-28 | Disposition: A | Discharge status: Home / Self Care | Payer: Medicare Other | Source: Ambulatory Visit | Attending: Internal Medicine | Admitting: Internal Medicine

## 2011-02-28 NOTE — Telephone Encounter (Signed)
New problem [facility] calling Ref number [id number] Pharmacy wants clarification of how many tablets per day Please call

## 2011-02-28 NOTE — Progress Notes (Signed)
Physical Therapy - Wound Therapy  Patient Details  Name: [name] MRN: [id number] Date of Birth: 03/13/1941  Today's Date: 02/28/2011 Time: 4098-1191 Time Calculation (min): 96 min Visit#: 96 Re-eval: 03/21/11 Charge: selective debridement < 20 cm estim 1 unit Ice massage 1 unit Wound Therapy Wound 05/04/10 Abrasion(s);Other (Comment) Ankle Left;Lateral scratch that turned into ulcer; 2.0cmX1cmX0.25cm with 95% slough and 5% granulation (Active)  Site / Wound Assessment Clean;Dry 02/28/2011  8:00 AM  % Wound base Red or Granulating 75% 02/22/2011  8:00 AM  % Wound base Yellow 25% 02/22/2011  8:00 AM  % Wound base Black 0% 11/03/2010  8:00 AM  Peri-wound Assessment Erythema (blanchable);Bleeding 11/06/2010  8:00 AM  Wound Length (cm) 1.5 cm 02/22/2011  8:00 AM  Wound Width (cm) 1 cm 02/22/2011  8:00 AM  Wound Depth (cm) 0.25 cm 02/22/2011  8:00 AM  Margins Other (Comment) 10/23/2010  8:00 AM  Drainage Amount Minimal 02/28/2011  8:00 AM  Drainage Description Sanguineous 02/28/2011  8:00 AM  Non-staged Wound Description Not applicable 02/28/2011  8:00 AM  Treatment Cleansed;Debridement (Selective);Electrical stimulation 02/28/2011  8:00 AM  Dressing Type Hydrogel;Moist to moist 02/28/2011  8:00 AM  Dressing Changed New 02/28/2011  8:00 AM  Dressing Status Clean;Dry;Intact 02/28/2011  8:00 AM   Electrical Stimulation Electrical Stimulation - Location: L lateral ankle Electrical Stimulation - Polarity: Negative #1 Electrical Stimulation - Pulse Rate: Continuous Electrical Stimulation - Intensity: 150 mV Electrical Stimulation - Duration: 60 minutes   Physical Therapy Assessment and Plan Wound Therapy - Assess/Plan/Recommendations Wound Therapy - Clinical Statement: Cont to have decrease in overall deth and improved border.  Discussed leaving bandage on until Wedensday to promote healing.  Wound Therapy - Functional Problem List: Vascular compromise Factors Delaying/Impairing  Wound Healing: Vascular compromise Hydrotherapy Plan: Debridement;Dressing change;Electrical stimulation;Patient/family education PT Assessment and Plan Clinical Impression Statement: Wound continues to have decrease in overall depth and improved border.  Leaving bandage for 2 days without changing dressing was found to be benefitial, pt encouraged to continue waiting 2-3 days between dressing changes to promote overall healing. PT Plan: Continue with wound care.    Goals    Problem List Patient Active Problem List  Diagnoses  . HYPERLIPIDEMIA  . HYPERTENSION  . ATRIAL FIBRILLATION  . ACUTE ON CHRONIC DIASTOLIC HEART FAILURE  . Occlusion and stenosis of carotid artery without mention of cerebral infarction  . SYNCOPE AND COLLAPSE  . Long term current use of anticoagulant  . Wound of ankle    [name] 02/28/2011, 9:46 AM

## 2011-02-28 NOTE — Telephone Encounter (Signed)
Spoke with pharm, questions regarding welchol answered Deliah Goody

## 2011-03-05 ENCOUNTER — Ambulatory Visit (HOSPITAL_COMMUNITY)
Admission: RE | Admit: 2011-03-05 | Discharge: 2011-03-05 | Disposition: A | Discharge status: Home / Self Care | Payer: Medicare Other | Source: Ambulatory Visit | Attending: Internal Medicine | Admitting: Internal Medicine

## 2011-03-05 NOTE — Progress Notes (Signed)
Physical Therapy - Wound Therapy  Patient Details  Name: [name] MRN: [id number] Date of Birth: 05-Oct-1940  Today's Date: 03/05/2011 Time: 9147-8295 Time Calculation (min): 80 min Charges: Deb <20cm, 1 estim Visit#: 97 Re-eval: 03/21/11  Wound Therapy  03/05/11 0800  Wound 05/04/10 Abrasion(s);Other (Comment) Ankle Left;Lateral scratch that turned into ulcer; 2.0cmX1cmX0.25cm with 95% slough and 5% granulation  Date First Assessed/Time First Assessed: 05/04/10 0805   Wound Type: Abrasion(s);Other (Comment)  Location: Ankle  Location Orientation: Left;Lateral  Wound Description (Comments): scratch that turned into ulcer; 2.0cmX1cmX0.25cm with 95% slough and 5% g  Site / Wound Assessment Clean;Dry  % Wound base Red or Granulating (80)  % Wound base Yellow (20)  Drainage Amount Minimal  Drainage Description Sanguineous  Non-staged Wound Description Not applicable  Treatment Cleansed;Debridement (Selective);Electrical stimulation  Dressing Type Hydrogel;Moist to moist  Dressing Changed New  Dressing Status Clean;Dry;Intact  Teacher, English as a foreign language Stimulation - Location L lateral ankle  Electrical Stimulation - Polarity Negative #2  Electrical Stimulation - Pulse Rate Continuous  Electrical Stimulation - Intensity 150 mV  Electrical Stimulation - Duration 60 minutes  Wound Therapy - Assess/Plan/Recommendations  Wound Therapy - Clinical Statement Wound is healing well and showed improved granulation and healthy bleeding with debridment today.  She continues to struggle with anxiety about her overall healing.   Wound Therapy - Functional Problem List Vascular compromise  Factors Delaying/Impairing Wound Healing Vascular compromise  Hydrotherapy Plan Debridement;Dressing change;Electrical stimulation;Patient/family education     Electrical Stimulation Electrical Stimulation - Location: L lateral ankle Electrical Stimulation - Polarity: Negative #2 Electrical  Stimulation - Pulse Rate: Continuous Electrical Stimulation - Intensity: 150 mV Electrical Stimulation - Duration: 60 minutes   Physical Therapy Assessment and Plan Wound Therapy - Assess/Plan/Recommendations Wound Therapy - Clinical Statement: Wound is healing well and showed improved granulation and healthy bleeding with debridment today.  She continues to struggle with anxiety about her overall healing.  Wound Therapy - Functional Problem List: Vascular compromise Factors Delaying/Impairing Wound Healing: Vascular compromise Hydrotherapy Plan: Debridement;Dressing change;Electrical stimulation;Patient/family education PT Assessment and Plan Clinical Impression Statement: Wound is healing well and showed improved granulation and healthy bleeding with debridment today.  She continues to struggle with anxiety about her overall healing.  PT Plan: Continue with wound care. Negative #3    Goals    Problem List Patient Active Problem List  Diagnoses  . HYPERLIPIDEMIA  . HYPERTENSION  . ATRIAL FIBRILLATION  . ACUTE ON CHRONIC DIASTOLIC HEART FAILURE  . Occlusion and stenosis of carotid artery without mention of cerebral infarction  . SYNCOPE AND COLLAPSE  . Long term current use of anticoagulant  . Wound of ankle    [name] 03/05/2011, 8:42 AM

## 2011-03-08 ENCOUNTER — Ambulatory Visit (HOSPITAL_COMMUNITY)
Admission: RE | Admit: 2011-03-08 | Discharge: 2011-03-08 | Disposition: A | Discharge status: Home / Self Care | Payer: Medicare Other | Source: Ambulatory Visit | Attending: Internal Medicine | Admitting: Internal Medicine

## 2011-03-08 NOTE — Progress Notes (Signed)
Physical Therapy - Wound Therapy  Patient Details  Name: [name] MRN: [id number] Date of Birth: [birth year]  Today's Date: 03/08/2011 Time: 0805-0928 Time Calculation (min): 83 min Visit#: 98 Re-eval: 03/21/11 Charges:  Debridement <20cm  Wound Therapy Wound 05/04/10 Abrasion(s);Other (Comment) Ankle Left;Lateral scratch that turned into ulcer; 2.0cmX1cmX0.25cm with 95% slough and 5% granulation (Active)  Site / Wound Assessment Clean;Dry 03/08/2011 11:00 AM  % Wound base Red or Granulating 75% 03/08/2011 11:00 AM  % Wound base Yellow 25% 03/08/2011 11:00 AM  % Wound base Black 0% 11/03/2010  8:00 AM  Peri-wound Assessment Erythema (blanchable);Bleeding 11/06/2010  8:00 AM  Wound Length (cm) 1.5 cm 02/22/2011  8:00 AM  Wound Width (cm) 1 cm 02/22/2011  8:00 AM  Wound Depth (cm) 0.25 cm 02/22/2011  8:00 AM  Margins Other (Comment) 10/23/2010  8:00 AM  Drainage Amount Minimal 03/08/2011 11:00 AM  Drainage Description Sanguineous 03/08/2011 11:00 AM  Non-staged Wound Description Not applicable 03/08/2011 11:00 AM  Treatment Cleansed;Debridement (Selective);Electrical stimulation 03/08/2011 11:00 AM  Dressing Type Hydrogel;Moist to moist 03/08/2011 11:00 AM  Dressing Changed New 03/08/2011 11:00 AM  Dressing Status Clean;Dry;Intact 03/08/2011 11:00 AM   Electrical Stimulation Electrical Stimulation - Location: L lateral ankle Electrical Stimulation - Polarity: Negative #3 Electrical Stimulation - Pulse Rate: Continuous Electrical Stimulation - Intensity: 150 mV Electrical Stimulation - Duration: 60 minutes   Physical Therapy Assessment and Plan Wound Therapy - Assess/Plan/Recommendations Wound Therapy - Clinical Statement: Wound is healing well and showed improved granulation and healthy bleeding with debridment today.  Less anxiety, tolerating debridement well today. Wound Therapy - Functional Problem List: Vascular compromise Factors Delaying/Impairing Wound Healing: Vascular  compromise Hydrotherapy Plan: Debridement;Dressing change;Electrical stimulation;Patient/family education PT Assessment and Plan Clinical Impression Statement: Wound continues to decrease in size and fill in with healthy tissue.  Perimeter remains dry, pt. is moisturizing at home.  Less anxiety today with improved tolerance for debridement. PT Plan: Begin Positive estim, treatment #1 next visit; continue woundcare.     Problem List Patient Active Problem List  Diagnoses  . HYPERLIPIDEMIA  . HYPERTENSION  . ATRIAL FIBRILLATION  . ACUTE ON CHRONIC DIASTOLIC HEART FAILURE  . Occlusion and stenosis of carotid artery without mention of cerebral infarction  . SYNCOPE AND COLLAPSE  . Long term current use of anticoagulant  . Wound of ankle    Lurena Nida 03/08/2011, 11:41 AM

## 2011-03-09 ENCOUNTER — Other Ambulatory Visit: Payer: Self-pay | Admitting: Orthopaedic Surgery

## 2011-03-09 DIAGNOSIS — M545 Low back pain, unspecified: Secondary | ICD-10-CM

## 2011-03-13 ENCOUNTER — Ambulatory Visit (HOSPITAL_COMMUNITY)
Admission: RE | Admit: 2011-03-13 | Discharge: 2011-03-13 | Disposition: A | Discharge status: Home / Self Care | Payer: Medicare Other | Source: Ambulatory Visit | Attending: Internal Medicine | Admitting: Internal Medicine

## 2011-03-13 DIAGNOSIS — IMO0001 Reserved for inherently not codable concepts without codable children: Secondary | ICD-10-CM | POA: Insufficient documentation

## 2011-03-13 DIAGNOSIS — L97309 Non-pressure chronic ulcer of unspecified ankle with unspecified severity: Secondary | ICD-10-CM | POA: Insufficient documentation

## 2011-03-13 NOTE — Progress Notes (Signed)
Physical Therapy - Wound Therapy  Patient Details  Name: [name] MRN: [id number] Date of Birth: [birth year]  Today's Date: 03/13/2011 Time: 0802-0924 Time Calculation (min): 82 min Visit#: 99 Re-eval: 03/21/11 Charges:  Deb <20cm, estim   Wound Therapy Wound 05/04/10 Abrasion(s);Other (Comment) Ankle Left;Lateral scratch that turned into ulcer; 2.0cmX1cmX0.25cm with 95% slough and 5% granulation (Active)  Site / Wound Assessment Clean;Dry 03/13/2011 12:00 PM  % Wound base Red or Granulating 75% 03/08/2011 11:00 AM  % Wound base Yellow 25% 03/08/2011 11:00 AM  % Wound base Black 0% 11/03/2010  8:00 AM  Peri-wound Assessment Erythema (blanchable);Bleeding 11/06/2010  8:00 AM  Wound Length (cm) 1.5 cm 02/22/2011  8:00 AM  Wound Width (cm) 1 cm 02/22/2011  8:00 AM  Wound Depth (cm) 0.25 cm 02/22/2011  8:00 AM  Margins Other (Comment) 10/23/2010  8:00 AM  Drainage Amount Minimal 03/13/2011 12:00 PM  Drainage Description Sanguineous 03/13/2011 12:00 PM  Non-staged Wound Description Not applicable 03/13/2011 12:00 PM  Treatment Cleansed;Debridement (Selective);Electrical stimulation 03/13/2011 12:00 PM  Dressing Type Hydrogel;Moist to moist 03/13/2011 12:00 PM  Dressing Changed New 03/13/2011 12:00 PM  Dressing Status Intact;Clean;Dry 03/13/2011 12:00 PM   Electrical Stimulation Electrical Stimulation - Location: L lateral ankle Electrical Stimulation - Polarity: Positive #1 Electrical Stimulation - Pulse Rate: Continuous Electrical Stimulation - Intensity: 150 mV Electrical Stimulation - Duration: 60 minutes   Physical Therapy Assessment and Plan Wound Therapy - Assess/Plan/Recommendations Wound Therapy - Clinical Statement: Wound is healing well and showed improved granulation and healthy bleeding with debridment today.  Less anxiety, tolerating debridement well today. Wound Therapy - Functional Problem List: Vascular compromise Factors Delaying/Impairing Wound Healing: Vascular  compromise Hydrotherapy Plan: Debridement;Dressing change;Electrical stimulation;Patient/family education PT Assessment and Plan Clinical Impression Statement: A little bleeding from wound noted after estim removal today, but subsided prior to dressing wound.  Wound continues to granulate with overall 85% granulation in wound bed. PT Plan: Continue X 2 more visits of positive charged estim.     Problem List Patient Active Problem List  Diagnoses  . HYPERLIPIDEMIA  . HYPERTENSION  . ATRIAL FIBRILLATION  . ACUTE ON CHRONIC DIASTOLIC HEART FAILURE  . Occlusion and stenosis of carotid artery without mention of cerebral infarction  . SYNCOPE AND COLLAPSE  . Long term current use of anticoagulant  . Wound of ankle    Lurena Nida 03/13/2011, 12:16 PM

## 2011-03-16 ENCOUNTER — Ambulatory Visit (HOSPITAL_COMMUNITY)
Admission: RE | Admit: 2011-03-16 | Discharge: 2011-03-16 | Disposition: A | Discharge status: Home / Self Care | Payer: Medicare Other | Source: Ambulatory Visit | Attending: Internal Medicine | Admitting: Internal Medicine

## 2011-03-16 NOTE — Progress Notes (Signed)
Physical Therapy - Wound Therapy  Patient Details  Name: [name] MRN: [id number] Date of Birth: [birth year]  Today's Date: 03/16/2011 Time: 0805-0920 Time Calculation (min): 75 min Charges: Deb < 20 cm, E-stim Visit#:100  Re-eval: 03/21/11  Wound Therapy Wound 05/04/10 Abrasion(s);Other (Comment) Ankle Left;Lateral scratch that turned into ulcer; 2.0cmX1cmX0.25cm with 95% slough and 5% granulation (Active)  Site / Wound Assessment Clean;Dry 03/13/2011 12:00 PM  % Wound base Red or Granulating 85 03/16/2011 11:00 AM  % Wound base Yellow 15 12/7//2012 11:00 AM  % Wound base Black 0% 11/03/2010  8:00 AM  Peri-wound Assessment Erythema (blanchable);Bleeding 11/06/2010  8:00 AM  Wound Length (cm) 1.5 cm 02/22/2011  8:00 AM  Wound Width (cm) 1 cm 02/22/2011  8:00 AM  Wound Depth (cm) 0.25 cm 02/22/2011  8:00 AM  Margins Other (Comment) 10/23/2010  8:00 AM  Drainage Amount Minimal 03/16/2011  8:00 AM  Drainage Description Sanguineous 03/16/2011  8:00 AM  Non-staged Wound Description Not applicable 03/16/2011  8:00 AM  Treatment Cleansed;Debridement (Selective);Electrical stimulation 03/16/2011  8:00 AM  Dressing Type Hydrogel;Moist to moist 03/16/2011  8:00 AM  Dressing Changed New 03/16/2011  8:00 AM  Dressing Status Intact;Clean;Dry 03/16/2011  8:00 AM   Electrical Stimulation Electrical Stimulation - Location: L lateral ankle Electrical Stimulation - Polarity: Positive #2 Electrical Stimulation - Pulse Rate: Continuous Electrical Stimulation - Intensity: 150 mV Electrical Stimulation - Duration: 60 minutes   Physical Therapy Assessment and Plan Wound Therapy - Assess/Plan/Recommendations Wound Therapy - Clinical Statement: Wound continues to show progress with improved granulation and bleeding to the ankle.  Wound Therapy - Functional Problem List: Vascular compromise Factors Delaying/Impairing Wound Healing: Vascular compromise Hydrotherapy Plan: Debridement;Dressing change;Electrical  stimulation;Patient/family education PT Assessment and Plan Clinical Impression Statement: Wound continues to show progress with improved granulation and bleeding to the ankle.  PT Plan: Cont 1 more positive e-stim    Goals    Problem List Patient Active Problem List  Diagnoses  . HYPERLIPIDEMIA  . HYPERTENSION  . ATRIAL FIBRILLATION  . ACUTE ON CHRONIC DIASTOLIC HEART FAILURE  . Occlusion and stenosis of carotid artery without mention of cerebral infarction  . SYNCOPE AND COLLAPSE  . Long term current use of anticoagulant  . Wound of ankle    [name] 03/16/2011, 8:32 AM

## 2011-03-19 ENCOUNTER — Telehealth: Payer: Self-pay | Admitting: Cardiovascular Disease

## 2011-03-19 MED ORDER — PRAVASTATIN SODIUM 40 MG PO TABS: 40.0000 mg | ORAL_TABLET | Freq: Every day | ORAL | Status: DC

## 2011-03-19 NOTE — Telephone Encounter (Signed)
New msg Pt wants refill of pravastatin called to [facility]

## 2011-03-20 ENCOUNTER — Ambulatory Visit (HOSPITAL_COMMUNITY)
Admission: RE | Admit: 2011-03-20 | Discharge: 2011-03-20 | Disposition: A | Discharge status: Home / Self Care | Payer: Medicare Other | Source: Ambulatory Visit | Attending: Internal Medicine | Admitting: Internal Medicine

## 2011-03-20 NOTE — Progress Notes (Signed)
Physical Therapy - Wound Therapy  Patient Details  Name: [name] MRN: [id number] Date of Birth: April 17, 1940  Today's Date: 03/20/2011 Time: 0800-0915 Time Calculation (min): 75 min Visit#: 101 Re-eval: 03/22/11 Charges:  Debridement < 20cm, estim unattended  Wound Therapy Wound 05/04/10 Abrasion(s);Other (Comment) Ankle Left;Lateral scratch that turned into ulcer; 2.0cmX1cmX0.25cm with 95% slough and 5% granulation (Active)  Site / Wound Assessment Clean;Dry 03/20/2011  8:00 AM  % Wound base Red or Granulating 75% 03/08/2011 11:00 AM  % Wound base Yellow 25% 03/08/2011 11:00 AM  % Wound base Black 0% 11/03/2010  8:00 AM  Peri-wound Assessment Erythema (blanchable);Bleeding 11/06/2010  8:00 AM  Wound Length (cm) 1.5 cm 02/22/2011  8:00 AM  Wound Width (cm) 1 cm 02/22/2011  8:00 AM  Wound Depth (cm) 0.25 cm 02/22/2011  8:00 AM  Margins Other (Comment) 10/23/2010  8:00 AM  Drainage Amount Scant 03/20/2011  8:00 AM  Drainage Description Sanguineous 03/20/2011  8:00 AM  Non-staged Wound Description Not applicable 03/20/2011  8:00 AM  Treatment Cleansed;Debridement (Selective);Electrical stimulation 03/20/2011  8:00 AM  Dressing Type Hydrogel 03/20/2011  8:00 AM  Dressing Changed New 03/20/2011  8:00 AM  Dressing Status Intact;Clean;Dry 03/20/2011  8:00 AM   Electrical Stimulation Electrical Stimulation - Location: L lateral ankle Electrical Stimulation - Polarity: Positive #3 Electrical Stimulation - Pulse Rate: Continuous Electrical Stimulation - Intensity: 150 mV Electrical Stimulation - Duration: 60 minutes   Physical Therapy Assessment and Plan Wound Therapy - Assess/Plan/Recommendations Wound Therapy - Clinical Statement: Wound continues to show progress with improved granulation and bleeding to the ankle.  Wound Therapy - Functional Problem List: Vascular compromise Factors Delaying/Impairing Wound Healing: Vascular compromise Hydrotherapy Plan: Debridement;Dressing  change;Electrical stimulation;Patient/family education PT Assessment and Plan Clinical Impression Statement: Continued granulation and overall reduction in size.  Less sensitvity to wound perimeter. PT Plan: Re-evaluate next visit; change to negative polarity with electrical stimulation.    Problem List Patient Active Problem List  Diagnoses  . HYPERLIPIDEMIA  . HYPERTENSION  . ATRIAL FIBRILLATION  . ACUTE ON CHRONIC DIASTOLIC HEART FAILURE  . Occlusion and stenosis of carotid artery without mention of cerebral infarction  . SYNCOPE AND COLLAPSE  . Long term current use of anticoagulant  . Wound of ankle    Lurena Nida 03/20/2011, 8:24 AM

## 2011-03-23 ENCOUNTER — Ambulatory Visit (HOSPITAL_COMMUNITY)
Admission: RE | Admit: 2011-03-23 | Discharge: 2011-03-23 | Disposition: A | Discharge status: Home / Self Care | Payer: Medicare Other | Source: Ambulatory Visit | Attending: Internal Medicine | Admitting: Internal Medicine

## 2011-03-23 NOTE — Progress Notes (Addendum)
Physical Therapy - Wound Therapy  Patient Details  Name: [name] MRN: [id number] Date of Birth: 25-Aug-1940  Today's Date: 03/23/2011 Time: 0805-0920 Time Calculation (min): 75 min Charges: Deb <20 cm; e-stim Visit#: 102 Re-eval: 04/22/11  03/23/11 0800  Wound 05/04/10 Abrasion(s);Other (Comment) Ankle Left;Lateral scratch that turned into ulcer; 2.0cmX1cmX0.25cm with 95% slough and 5% granulation  Date First Assessed/Time First Assessed: 05/04/10 0805   Wound Type: Abrasion(s);Other (Comment)  Location: Ankle  Location Orientation: Left;Lateral  Wound Description (Comments): scratch that turned into ulcer; 2.0cmX1cmX0.25cm with 95% slough and 5% g  Site / Wound Assessment Clean;Dry  % Wound base Red or Granulating (90%)  % Wound base Yellow (10%)  Drainage Amount Scant  Drainage Description Serous  Non-staged Wound Description Not applicable  Treatment Cleansed;Debridement (Selective);Electrical stimulation  Dressing Type Hydrogel  Dressing Changed New  Dressing Status Intact;Clean;Dry  Banker - Location L lateral ankle  Electrical Stimulation - Polarity Negative #1  Electrical Stimulation - Pulse Rate Continuous  Electrical Stimulation - Intensity 150 mV  Electrical Stimulation - Duration 60 minutes  Wound Therapy - Assess/Plan/Recommendations  Wound Therapy - Clinical Statement Wound continues to improve granulation with decreased necrotic tissue.  Wound Therapy - Functional Problem List Vascular compromise  Factors Delaying/Impairing Wound Healing Vascular compromise  Hydrotherapy Plan Debridement;Dressing change;Electrical stimulation;Patient/family education   Measurements: 1.5cmx0.7cmx0cm; 100% granulation after treatment today  Electrical Stimulation Electrical Stimulation - Location: L lateral ankle Electrical Stimulation - Polarity: Negative #1 Electrical Stimulation - Pulse Rate: Continuous Electrical Stimulation -  Intensity: 150 mV Electrical Stimulation - Duration: 60 minutes   Physical Therapy Assessment and Plan Wound Therapy - Assess/Plan/Recommendations Wound Therapy - Clinical Statement: Wound continues to improve granulation with decreased necrotic tissue. Wound Therapy - Functional Problem List: Vascular compromise Factors Delaying/Impairing Wound Healing: Vascular compromise Hydrotherapy Plan: Debridement;Dressing change;Electrical stimulation;Patient/family education PT Assessment and Plan Clinical Impression Statement: [name] has attended 101 visits of OPPT to address a non healing wound. She has improved granulation, decreased necrotic tissue and approximating borders.  She will continue to benefit from skilled OPPT to address continued wound care.  PT Frequency: Min 2X/week PT Duration: 8 weeks PT Plan: Continue with negative polarity x2 more visits    Goals PT Short Term Goals PT Short Term Goal 1: Necrotic tissue to be 0%, granulation to be 100%  PT Short Term Goal 1 - Progress: Progressing toward goal PT Short Term Goal 2: Pt/family will be independent with dressing changes/self-care PT Short Term Goal 2 - Progress: Met PT Short Term Goal 3: Decreased wound size by 1.5cm X 0.5cm X 0.25cm  PT Short Term Goal 3 - Progress: Progressing toward goal PT Short Term Goal 4: Decreased drainage equal or less than scant  PT Short Term Goal 4 - Progress: Met  Problem List Patient Active Problem List  Diagnoses  . HYPERLIPIDEMIA  . HYPERTENSION  . ATRIAL FIBRILLATION  . ACUTE ON CHRONIC DIASTOLIC HEART FAILURE  . Occlusion and stenosis of carotid artery without mention of cerebral infarction  . SYNCOPE AND COLLAPSE  . Long term current use of anticoagulant  . Wound of ankle    Edgardo Petrenko 03/23/2011, 9:40 AM

## 2011-03-26 ENCOUNTER — Encounter: Payer: Medicare Other | Admitting: *Deleted

## 2011-03-27 ENCOUNTER — Ambulatory Visit (HOSPITAL_COMMUNITY)
Admission: RE | Admit: 2011-03-27 | Discharge: 2011-03-27 | Disposition: A | Discharge status: Home / Self Care | Payer: Medicare Other | Source: Ambulatory Visit | Attending: Internal Medicine | Admitting: Internal Medicine

## 2011-03-27 NOTE — Progress Notes (Addendum)
Physical Therapy Treatment Patient Details  Name: [name] MRN: [id number] Date of Birth: June 26, 1940  Today's Date: 03/27/2011 Time: 4098-1191 Time Calculation (min): 69 min Deb <20 cm, 1 estim Visit#: 103  of    Re-eval: 04/21/10    Subjective: Symptoms/Limitations Symptoms: Reports she thinks she is doing very well.   Precautions/Restrictions     Mobility (including Balance)       Exercise/Treatments  03/27/11 0800  Site / Wound Assessment Clean;Dry  % Wound base Red or Granulating 100%  % Wound base Yellow 0%  Peri-wound Assessment Erythema (blanchable)  Drainage Amount Scant  Drainage Description Serous  Non-staged Wound Description Not applicable  Treatment Cleansed;Debridement (Selective);Electrical stimulation (Q-tip only, did not utilize forceps or scaple)  Dressing Type Hydrogel  Dressing Changed New  Dressing Status Intact;Clean;Dry  Banker - Location L lateral ankle  Electrical Stimulation - Polarity Negative #2  Electrical Stimulation - Pulse Rate Continuous  Electrical Stimulation - Intensity 150 mV  Electrical Stimulation - Duration 60 minutes  Measurements: Length: 0.8cm  Width: 0.4cm  Depth: 0cm   Physical Therapy Assessment and Plan PT Assessment and Plan Clinical Impression Statement: Pt continues to demonstrate improved granulation and decreased width.  Minimal debridment with a q-tip required. PT Plan: Cont with negative polarity x1 more visit.    Goals    Problem List Patient Active Problem List  Diagnoses  . HYPERLIPIDEMIA  . HYPERTENSION  . ATRIAL FIBRILLATION  . ACUTE ON CHRONIC DIASTOLIC HEART FAILURE  . Occlusion and stenosis of carotid artery without mention of cerebral infarction  . SYNCOPE AND COLLAPSE  . Long term current use of anticoagulant  . Wound of ankle    PT - End of Session Activity Tolerance: Patient tolerated treatment well  Damire Remedios 03/27/2011, 8:25 AM

## 2011-03-28 ENCOUNTER — Ambulatory Visit (INDEPENDENT_AMBULATORY_CARE_PROVIDER_SITE_OTHER): Payer: Medicare Other | Admitting: *Deleted

## 2011-03-28 DIAGNOSIS — I4891 Unspecified atrial fibrillation: Secondary | ICD-10-CM

## 2011-03-28 DIAGNOSIS — Z7901 Long term (current) use of anticoagulants: Secondary | ICD-10-CM

## 2011-03-29 ENCOUNTER — Ambulatory Visit
Admission: RE | Admit: 2011-03-29 | Discharge: 2011-03-29 | Disposition: A | Discharge status: Home / Self Care | Payer: Medicare Other | Source: Ambulatory Visit | Attending: Orthopaedic Surgery | Admitting: Orthopaedic Surgery

## 2011-03-29 DIAGNOSIS — M545 Low back pain, unspecified: Secondary | ICD-10-CM

## 2011-03-29 MED ORDER — IOHEXOL 180 MG/ML  SOLN
1.0000 mL | Freq: Once | INTRAMUSCULAR | Status: AC | PRN
  Administered 2011-03-29: 1 mL via EPIDURAL

## 2011-03-29 MED ORDER — DIAZEPAM 5 MG PO TABS
5.0000 mg | ORAL_TABLET | Freq: Once | ORAL | Status: AC
  Administered 2011-03-29: 5 mg via ORAL

## 2011-03-29 MED ORDER — METHYLPREDNISOLONE ACETATE 40 MG/ML INJ SUSP (RADIOLOG
120.0000 mg | Freq: Once | INTRAMUSCULAR | Status: AC
  Administered 2011-03-29: 120 mg via EPIDURAL

## 2011-03-29 MED ORDER — DIPHENHYDRAMINE HCL 50 MG PO CAPS
50.0000 mg | ORAL_CAPSULE | Freq: Once | ORAL | Status: AC
  Administered 2011-03-29: 50 mg via ORAL

## 2011-03-29 NOTE — Patient Instructions (Signed)

## 2011-03-30 ENCOUNTER — Ambulatory Visit (HOSPITAL_COMMUNITY)
Admission: RE | Admit: 2011-03-30 | Discharge: 2011-03-30 | Disposition: A | Discharge status: Home / Self Care | Payer: Medicare Other | Source: Ambulatory Visit | Attending: Internal Medicine | Admitting: Internal Medicine

## 2011-03-30 NOTE — Progress Notes (Signed)
Physical Therapy - Wound Therapy  Patient Details  Name: MKENZIE DOTTS MRN: [id number] Date of Birth: [birth year]  Today's Date: 03/30/2011 Time:  8:00-8:25 Visit#: 104 Re-eval:  04/21/2010 Charge: selective debridement < 20 cm Wound Therapy Wound 05/04/10 Abrasion(s);Other (Comment) Ankle Left;Lateral scratch that turned into ulcer; 2.0cmX1cmX0.25cm with 95% slough and 5% granulation (Active)  Site / Wound Assessment Clean;Dry 03/30/2011  8:23 AM  % Wound base Red or Granulating 100% 03/30/2011  8:23 AM  % Wound base Yellow 0% 03/30/2011  8:23 AM  % Wound base Black 0% 03/30/2011  8:23 AM  % Wound base Other (Comment) 0% 03/30/2011  8:23 AM  Peri-wound Assessment Erythema (blanchable) 03/30/2011  8:23 AM  Wound Length (cm) 0.8 cm 03/30/2011  8:23 AM  Wound Width (cm) 0.4 cm 03/30/2011  8:23 AM  Wound Depth (cm) 0 cm 03/30/2011  8:23 AM  Tunneling (cm) no tunneling 03/30/2011  8:23 AM  Undermining (cm) no undermining 03/30/2011  8:23 AM  Margins Other (Comment) 10/23/2010  8:00 AM  Drainage Amount Scant 03/27/2011  8:00 AM  Drainage Description Serous 03/27/2011  8:00 AM  Non-staged Wound Description Not applicable 03/27/2011  8:00 AM  Treatment Cleansed;Debridement (Selective);Electrical stimulation 03/27/2011  8:00 AM  Dressing Type Hydrogel 03/30/2011  8:23 AM  Dressing Changed New 03/30/2011  8:23 AM  Dressing Status Intact;Clean;Dry 03/30/2011  8:23 AM   Measurement: .8x .4x 0 depth Selective Debridement Selective Debridement - Location: L ankle Selective Debridement - Tools Used: Scalpel (q tips)   Physical Therapy Assessment and Plan Wound Therapy - Assess/Plan/Recommendations Wound Therapy - Clinical Statement: Wound continues to improve in granulation and decreased necrotic tissue.  Two darker pigmentation spots noted distal wound.     P: Negative polarity x 1 more session estim, look at distal pigmentation spots.  Goals    Problem List Patient Active Problem List   Diagnoses  . HYPERLIPIDEMIA  . HYPERTENSION  . ATRIAL FIBRILLATION  . ACUTE ON CHRONIC DIASTOLIC HEART FAILURE  . Occlusion and stenosis of carotid artery without mention of cerebral infarction  . SYNCOPE AND COLLAPSE  . Long term current use of anticoagulant  . Wound of ankle    [name] 03/30/2011, 8:29 AM

## 2011-04-04 ENCOUNTER — Ambulatory Visit (HOSPITAL_COMMUNITY)
Admission: RE | Admit: 2011-04-04 | Discharge: 2011-04-04 | Disposition: A | Discharge status: Home / Self Care | Payer: Medicare Other | Source: Ambulatory Visit | Attending: Internal Medicine | Admitting: Internal Medicine

## 2011-04-04 NOTE — Progress Notes (Signed)
Physical Therapy - Wound Therapy  Patient Details  Name: [name] MRN: [id number] Date of Birth: January 29, 1941  Today's Date: 04/04/2011 Time: 1430-1520 Time Calculation (min): 50 min Visit#: 105 Re-eval: 04/22/11 Charge: selective debridement < 20 cm  Wound Therapy Wound 05/04/10 Abrasion(s);Other (Comment) Ankle Left;Lateral scratch that turned into ulcer; 2.0cmX1cmX0.25cm with 95% slough and 5% granulation (Active)  Site / Wound Assessment Clean;Dry 04/04/2011  2:57 PM  % Wound base Red or Granulating 100% 04/04/2011  2:57 PM  % Wound base Yellow 0% 04/04/2011  2:57 PM  % Wound base Black 0% 04/04/2011  2:57 PM  % Wound base Other (Comment) 0% 04/04/2011  2:57 PM  Peri-wound Assessment Erythema (blanchable) 04/04/2011  2:57 PM  Wound Length (cm) 0.6 cm 04/04/2011  2:57 PM  Wound Width (cm) 0.4 cm 04/04/2011  2:57 PM  Wound Depth (cm) 0 cm 04/04/2011  2:57 PM  Tunneling (cm) no tunnerling 04/04/2011  2:57 PM  Undermining (cm) no undermining  04/04/2011  2:57 PM  Margins Other (Comment) 10/23/2010  8:00 AM  Drainage Amount Scant 04/04/2011  2:57 PM  Drainage Description Serous 04/04/2011  2:57 PM  Non-staged Wound Description Not applicable 03/27/2011  8:00 AM  Treatment Cleansed;Debridement (Selective);Electrical stimulation 04/04/2011  2:57 PM  Dressing Type Hydrogel 04/04/2011  2:57 PM  Dressing Changed New 04/04/2011  2:57 PM  Dressing Status Clean;Dry;Intact 04/04/2011  2:57 PM   Selective Debridement Selective Debridement - Location: L ankle  Selective Debridement - Tools Used: Forceps (q tips) Pharmacologist - Location: L lateral ankle Electrical Stimulation - Polarity: Negative #3 Electrical Stimulation - Pulse Rate: Continuous Electrical Stimulation - Intensity: 150 mV Electrical Stimulation - Duration: 30 minutes   Physical Therapy Assessment and Plan Wound Therapy - Assess/Plan/Recommendations Wound Therapy - Clinical Statement:  Pt removed scab over wound with finger nails, pt educated to not do so to prevent infection.  The two dark pigmentation spots remain with no change in size.   Hydrotherapy Plan:  (Begin positive polarity #1next session) PT Assessment and Plan Clinical Impression Statement: Pt removed scab over wound with finger nails, pt educated to not do so to prevent infection. The two dark pigmentation spots remain with no change in size PT Plan: Begin positive polarity #1 next session.    Goals    Problem List Patient Active Problem List  Diagnoses  . HYPERLIPIDEMIA  . HYPERTENSION  . ATRIAL FIBRILLATION  . ACUTE ON CHRONIC DIASTOLIC HEART FAILURE  . Occlusion and stenosis of carotid artery without mention of cerebral infarction  . SYNCOPE AND COLLAPSE  . Long term current use of anticoagulant  . Wound of ankle    [name] 04/04/2011, 3:31 PM

## 2011-04-05 ENCOUNTER — Ambulatory Visit (INDEPENDENT_AMBULATORY_CARE_PROVIDER_SITE_OTHER): Payer: Medicare Other | Admitting: *Deleted

## 2011-04-05 DIAGNOSIS — Z7901 Long term (current) use of anticoagulants: Secondary | ICD-10-CM

## 2011-04-05 DIAGNOSIS — I4891 Unspecified atrial fibrillation: Secondary | ICD-10-CM

## 2011-04-10 HISTORY — PX: CATARACT EXTRACTION: SUR2

## 2011-04-13 ENCOUNTER — Other Ambulatory Visit (HOSPITAL_COMMUNITY): Payer: Self-pay | Admitting: Internal Medicine

## 2011-04-13 ENCOUNTER — Ambulatory Visit (HOSPITAL_COMMUNITY)
Admission: RE | Admit: 2011-04-13 | Discharge: 2011-04-13 | Disposition: A | Discharge status: Home / Self Care | Payer: Medicare Other | Source: Ambulatory Visit | Attending: Internal Medicine | Admitting: Internal Medicine

## 2011-04-13 DIAGNOSIS — Z139 Encounter for screening, unspecified: Secondary | ICD-10-CM

## 2011-04-13 DIAGNOSIS — L97309 Non-pressure chronic ulcer of unspecified ankle with unspecified severity: Secondary | ICD-10-CM | POA: Insufficient documentation

## 2011-04-13 DIAGNOSIS — IMO0001 Reserved for inherently not codable concepts without codable children: Secondary | ICD-10-CM | POA: Diagnosis not present

## 2011-04-13 DIAGNOSIS — M545 Low back pain: Secondary | ICD-10-CM | POA: Diagnosis not present

## 2011-04-13 DIAGNOSIS — M999 Biomechanical lesion, unspecified: Secondary | ICD-10-CM | POA: Diagnosis not present

## 2011-04-13 DIAGNOSIS — M5137 Other intervertebral disc degeneration, lumbosacral region: Secondary | ICD-10-CM | POA: Diagnosis not present

## 2011-04-13 NOTE — Progress Notes (Signed)
Physical Therapy - Wound Therapy  Treatment  Patient Details  Name: [name] MRN: [id number] Date of Birth: 1940/12/22  Today's Date: 04/13/2011 Time: 0805-0910 Time Calculation (min): 65 min  Visit#: 106  of    Re-eval: 04/22/11  Wound Therapy Wound 05/04/10 Abrasion(s);Other (Comment) Ankle Left;Lateral scratch that turned into ulcer; 2.0cmX1cmX0.25cm with 95% slough and 5% granulation (Active)  Site / Wound Assessment Clean;Dry 04/13/2011  8:29 AM  % Wound base Red or Granulating 100% 04/13/2011  8:29 AM  % Wound base Yellow 0% 04/13/2011  8:29 AM  % Wound base Black 0% 04/13/2011  8:29 AM  % Wound base Other (Comment) 0% 04/13/2011  8:29 AM  Peri-wound Assessment Erythema (blanchable) 04/13/2011  8:29 AM  Wound Length (cm) 0.6 cm 04/13/2011  9:08 AM  Wound Width (cm) 0.5 cm 04/13/2011  9:08 AM  Wound Depth (cm) 0 cm 04/04/2011  2:57 PM  Tunneling (cm) no tunnerling 04/04/2011  2:57 PM  Undermining (cm) no undermining  04/04/2011  2:57 PM  Margins Other (Comment) 10/23/2010  8:00 AM  Drainage Amount Scant 04/04/2011  2:57 PM  Drainage Description Serous 04/04/2011  2:57 PM  Non-staged Wound Description Not applicable 03/27/2011  8:00 AM  Treatment Cleansed;Debridement (Selective);Electrical stimulation 04/13/2011  8:29 AM  Dressing Type Hydrogel 04/13/2011  8:29 AM  Dressing Changed New 04/13/2011  8:29 AM  Dressing Status Clean;Dry;Intact 04/13/2011  8:29 AM   Selective Debridement Selective Debridement - Location: L ankle Selective Debridement - Tools Used: Scalpel;Other (comment) (qtips) Pharmacologist - Location: L lateral ankle Electrical Stimulation - Polarity: Positive #1 Electrical Stimulation - Pulse Rate: hi volt continuous Electrical Stimulation - Intensity: 150 mV Electrical Stimulation - Duration: 30 minutes   Physical Therapy Assessment and Plan Wound Therapy - Assess/Plan/Recommendations Wound Therapy - Clinical Statement: Increased yellow  slough on two dark pigmentation spots at beginning of session, both width .4 cm x length .4 cm with no depth.  Wound continues to improve in healthy granulated tisssue. Hydrotherapy Plan: Debridement;Dressing change;Electrical stimulation Wound Therapy - Frequency:  (2x week)      Goals    Problem List Patient Active Problem List  Diagnoses  . HYPERLIPIDEMIA  . HYPERTENSION  . ATRIAL FIBRILLATION  . ACUTE ON CHRONIC DIASTOLIC HEART FAILURE  . Occlusion and stenosis of carotid artery without mention of cerebral infarction  . SYNCOPE AND COLLAPSE  . Long term current use of anticoagulant  . Wound of ankle    [name] 04/13/2011, 9:11 AM

## 2011-04-16 ENCOUNTER — Ambulatory Visit (HOSPITAL_COMMUNITY)
Admission: RE | Admit: 2011-04-16 | Discharge: 2011-04-16 | Disposition: A | Discharge status: Home / Self Care | Payer: Medicare Other | Source: Ambulatory Visit | Attending: Internal Medicine | Admitting: Internal Medicine

## 2011-04-16 DIAGNOSIS — L97309 Non-pressure chronic ulcer of unspecified ankle with unspecified severity: Secondary | ICD-10-CM | POA: Diagnosis not present

## 2011-04-16 DIAGNOSIS — IMO0001 Reserved for inherently not codable concepts without codable children: Secondary | ICD-10-CM | POA: Diagnosis not present

## 2011-04-16 NOTE — Progress Notes (Signed)
Physical Therapy - Wound Therapy  Treatment  Patient Details  Name: [name] MRN: [id number] Date of Birth: Nov 19, 1940  Today's Date: 04/16/2011 Time: 0805-0906 Time Calculation (min): 61 min Charges: Selective debridement (= or < 20 cm) Estim x 1    Visit#: 107   Re-eval: 04/22/11  Wound Therapy Wound 05/04/10 Abrasion(s);Other (Comment) Ankle Left;Lateral scratch that turned into ulcer; 2.0cmX1cmX0.25cm with 95% slough and 5% granulation (Active)  Site / Wound Assessment Clean;Dry 04/13/2011  8:29 AM  % Wound base Red or Granulating 100% 04/13/2011  8:29 AM  % Wound base Yellow 0% 04/13/2011  8:29 AM  % Wound base Black 0% 04/13/2011  8:29 AM  % Wound base Other (Comment) 0% 04/13/2011  8:29 AM  Peri-wound Assessment Erythema (blanchable) 04/13/2011  8:29 AM  Wound Length (cm) 0.6 cm 04/13/2011  9:08 AM  Wound Width (cm) 0.5 cm 04/13/2011  9:08 AM  Wound Depth (cm) 0 cm 04/04/2011  2:57 PM  Tunneling (cm) no tunnerling 04/04/2011  2:57 PM  Undermining (cm) no undermining  04/04/2011  2:57 PM  Margins Other (Comment) 10/23/2010  8:00 AM  Drainage Amount Scant 04/04/2011  2:57 PM  Drainage Description Serous 04/04/2011  2:57 PM  Non-staged Wound Description Not applicable 03/27/2011  8:00 AM  Treatment Cleansed;Debridement (Selective);Electrical stimulation 04/13/2011  8:29 AM  Dressing Type Hydrogel 04/13/2011  8:29 AM  Dressing Changed New 04/13/2011  8:29 AM  Dressing Status Clean;Dry;Intact 04/13/2011  8:29 AM       Physical Therapy Assessment and Plan Wound Therapy - Assess/Plan/Recommendations Wound Therapy - Clinical Statement: Wound continues to progress. Only one dark spot present this session. Dark spot measures .3x.8cm, no depth. Pt with increased granualtion at end of session compared to beginning  Hydrotherapy Plan: Debridement;Dressing change;Electrical stimulation PT Assessment and Plan Clinical Impression Statement: Pt presents with only dark pigmentation spot this session. Dark  spot coninues to have no depth. Pt continues to have anxiety with debridement. Estim only completed for 30' today per pt request. 1' ice massage completed to L ankle per pt request to wound before debridement to decrease pain/sensitivity. Pt reports no increased pain at end of session. PT Plan: Continue per PT POC. Continue with positive polarity #3 next session.     Problem List Patient Active Problem List  Diagnoses  . HYPERLIPIDEMIA  . HYPERTENSION  . ATRIAL FIBRILLATION  . ACUTE ON CHRONIC DIASTOLIC HEART FAILURE  . Occlusion and stenosis of carotid artery without mention of cerebral infarction  . SYNCOPE AND COLLAPSE  . Long term current use of anticoagulant  . Wound of ankle    [name] 04/16/2011, 10:54 AM

## 2011-04-20 ENCOUNTER — Ambulatory Visit (HOSPITAL_COMMUNITY)
Admission: RE | Admit: 2011-04-20 | Discharge: 2011-04-20 | Disposition: A | Discharge status: Home / Self Care | Payer: Medicare Other | Source: Ambulatory Visit | Attending: Internal Medicine | Admitting: Internal Medicine

## 2011-04-20 DIAGNOSIS — Z1231 Encounter for screening mammogram for malignant neoplasm of breast: Secondary | ICD-10-CM | POA: Diagnosis not present

## 2011-04-20 DIAGNOSIS — IMO0001 Reserved for inherently not codable concepts without codable children: Secondary | ICD-10-CM | POA: Diagnosis not present

## 2011-04-20 DIAGNOSIS — Z139 Encounter for screening, unspecified: Secondary | ICD-10-CM

## 2011-04-20 DIAGNOSIS — L97309 Non-pressure chronic ulcer of unspecified ankle with unspecified severity: Secondary | ICD-10-CM | POA: Diagnosis not present

## 2011-04-23 ENCOUNTER — Other Ambulatory Visit: Payer: Self-pay | Admitting: Cardiovascular Disease

## 2011-04-23 ENCOUNTER — Ambulatory Visit (HOSPITAL_COMMUNITY)
Admission: RE | Admit: 2011-04-23 | Discharge: 2011-04-23 | Disposition: A | Discharge status: Home / Self Care | Payer: Medicare Other | Source: Ambulatory Visit | Attending: Internal Medicine | Admitting: Internal Medicine

## 2011-04-23 DIAGNOSIS — IMO0001 Reserved for inherently not codable concepts without codable children: Secondary | ICD-10-CM | POA: Diagnosis not present

## 2011-04-23 DIAGNOSIS — L97309 Non-pressure chronic ulcer of unspecified ankle with unspecified severity: Secondary | ICD-10-CM | POA: Diagnosis not present

## 2011-04-23 MED ORDER — CLOPIDOGREL BISULFATE 75 MG PO TABS: 75.0000 mg | ORAL_TABLET | ORAL | Status: DC

## 2011-04-23 NOTE — Progress Notes (Signed)
Physical Therapy - Wound Therapy  Treatment  Patient Details  Name: [name] MRN: [id number] Date of Birth: 02-Jan-1941  Today's Date: 04/23/2011 Time: 0805-0906 Time Calculation (min): 61 min Charges:  Debridement <20cm, estim 30 minutes  Visit#: 109  of 117   Re-eval: 05/21/11  Wound Therapy Wound 05/04/10 Abrasion(s);Other (Comment) Ankle Left;Lateral scratch that turned into ulcer; 2.0cmX1cmX0.25cm with 95% slough and 5% granulation (Active)  Site / Wound Assessment Clean;Yellow;Pink 04/23/2011  8:00 AM  % Wound base Red or Granulating 75% 04/23/2011  8:00 AM  % Wound base Yellow 25% 04/23/2011  8:00 AM  % Wound base Black 0% 04/23/2011  8:00 AM  % Wound base Other (Comment) 0% 04/13/2011  8:29 AM  Peri-wound Assessment Intact;Erythema (blanchable) 04/23/2011  8:00 AM  Wound Length (cm) 1.5 cm 04/23/2011  8:00 AM  Wound Width (cm) 1 cm 04/23/2011  8:00 AM  Wound Depth (cm) 0.2 cm 04/23/2011  8:00 AM  Tunneling (cm) no tunnerling 04/04/2011  2:57 PM  Undermining (cm) no undermining  04/04/2011  2:57 PM  Margins Other (Comment) 10/23/2010  8:00 AM  Drainage Amount Minimal 04/23/2011  8:00 AM  Drainage Description Serous 04/23/2011  8:00 AM  Non-staged Wound Description Full thickness 04/20/2011  8:38 AM  Treatment Cleansed;Debridement (Selective);Electrical stimulation;Ice applied 04/23/2011  8:00 AM  Dressing Type Hydrogel 04/13/2011  8:29 AM  Dressing Changed New 04/23/2011  8:00 AM  Dressing Status Clean;Dry;Intact 04/23/2011  8:00 AM   Selective Debridement Selective Debridement - Location: L ankle  Selective Debridement - Tools Used: Forceps;Scalpel Selective Debridement - Tissue Removed: 80% slough Electrical Stimulation Electrical Stimulation - Location: L lateral ankle  Electrical Stimulation - Polarity: Positive # 3 Electrical Stimulation - Pulse Rate: high volt 75 Electrical Stimulation - Intensity: 150 mV Electrical Stimulation - Duration: 30 min.   Physical Therapy  Assessment and Plan PT Assessment and Plan Clinical Impression Statement: Wound re-measured with overall increase in size from 04/13/11 visit without known cause.  Also with small wound inferior wound approx. 0.2cm2 in size.  Pt. continues to have sensitivity to debridement requiring ice massage prior to treating. (wound drawing completed and sent to scan in media section) PT Frequency: Min 2X/week PT Duration: 4 weeks PT Plan: Recommend to PT to Continue woundcare as goals not met.  Change to negative polarity #1 next visit.    Goals PT Short Term Goals PT Short Term Goal 1: Necrotic tissue to be 0%, granulation to be 100%  PT Short Term Goal 1 - Progress: Progressing toward goal PT Short Term Goal 2: Pt/family will be independent with dressing changes/self-care PT Short Term Goal 2 - Progress: Met PT Short Term Goal 3: Decreased wound size by 1.5cm X 0.5cm X 0.25cm  PT Short Term Goal 3 - Progress: Not met PT Short Term Goal 4: Decreased drainage equal or less than scant  PT Short Term Goal 4 - Progress: Progressing toward goal  Problem List Patient Active Problem List  Diagnoses  . HYPERLIPIDEMIA  . HYPERTENSION  . ATRIAL FIBRILLATION  . ACUTE ON CHRONIC DIASTOLIC HEART FAILURE  . Occlusion and stenosis of carotid artery without mention of cerebral infarction  . SYNCOPE AND COLLAPSE  . Long term current use of anticoagulant  . Wound of ankle    Amy B. Bascom Levels, PTA 04/23/2011, 8:53 AM

## 2011-04-27 ENCOUNTER — Ambulatory Visit (HOSPITAL_COMMUNITY): Payer: Medicare Other | Admitting: Physical Therapy

## 2011-04-27 ENCOUNTER — Telehealth (HOSPITAL_COMMUNITY): Payer: Self-pay | Admitting: Physical Therapy

## 2011-04-30 ENCOUNTER — Ambulatory Visit (HOSPITAL_COMMUNITY)
Admission: RE | Admit: 2011-04-30 | Discharge: 2011-04-30 | Disposition: A | Discharge status: Home / Self Care | Payer: Medicare Other | Source: Ambulatory Visit | Attending: Internal Medicine | Admitting: Internal Medicine

## 2011-04-30 ENCOUNTER — Ambulatory Visit (INDEPENDENT_AMBULATORY_CARE_PROVIDER_SITE_OTHER): Payer: Medicare Other | Admitting: *Deleted

## 2011-04-30 DIAGNOSIS — Z7901 Long term (current) use of anticoagulants: Secondary | ICD-10-CM | POA: Diagnosis not present

## 2011-04-30 DIAGNOSIS — M5137 Other intervertebral disc degeneration, lumbosacral region: Secondary | ICD-10-CM | POA: Diagnosis not present

## 2011-04-30 DIAGNOSIS — IMO0001 Reserved for inherently not codable concepts without codable children: Secondary | ICD-10-CM | POA: Diagnosis not present

## 2011-04-30 DIAGNOSIS — I4891 Unspecified atrial fibrillation: Secondary | ICD-10-CM | POA: Diagnosis not present

## 2011-04-30 DIAGNOSIS — M545 Low back pain: Secondary | ICD-10-CM | POA: Diagnosis not present

## 2011-04-30 DIAGNOSIS — M999 Biomechanical lesion, unspecified: Secondary | ICD-10-CM | POA: Diagnosis not present

## 2011-04-30 DIAGNOSIS — L97309 Non-pressure chronic ulcer of unspecified ankle with unspecified severity: Secondary | ICD-10-CM | POA: Diagnosis not present

## 2011-04-30 LAB — POCT INR: INR: 1.9

## 2011-04-30 NOTE — Progress Notes (Signed)
Physical Therapy - Wound Therapy  Treatment  Patient Details  Name: [name] MRN: [id number] Date of Birth: 02/04/1941  Today's Date: 04/30/2011 Time: 0808-0900 Time Calculation (min): 52 min  Visit#: 110  of 117   Re-eval: 05/21/11 Charges: Selective debridement (= or < 20 cm) Estim x 30'  Wound Therapy Wound 05/04/10 Abrasion(s);Other (Comment) Ankle Left;Lateral scratch that turned into ulcer; 2.0cmX1cmX0.25cm with 95% slough and 5% granulation (Active)  Site / Wound Assessment Clean;Yellow;Pink 04/30/2011  8:00 AM  % Wound base Red or Granulating 75% 04/30/2011  8:00 AM  % Wound base Yellow 25% 04/30/2011  8:00 AM  % Wound base Black 0% 04/30/2011  8:00 AM  % Wound base Other (Comment) 0% 04/13/2011  8:29 AM  Peri-wound Assessment Intact;Erythema (blanchable) 04/30/2011  8:00 AM  Wound Length (cm) 1.5 cm 04/23/2011  8:00 AM  Wound Width (cm) 1 cm 04/23/2011  8:00 AM  Wound Depth (cm) 0.2 cm 04/23/2011  8:00 AM  Tunneling (cm) no tunnerling 04/04/2011  2:57 PM  Undermining (cm) no undermining  04/04/2011  2:57 PM  Margins Other (Comment) 10/23/2010  8:00 AM  Drainage Amount Minimal 04/30/2011  8:00 AM  Drainage Description Serous 04/30/2011  8:00 AM  Non-staged Wound Description Full thickness 04/20/2011  8:38 AM  Treatment Cleansed;Debridement (Selective);Electrical stimulation;Ice applied 04/30/2011  8:00 AM  Dressing Type Hydrogel 04/13/2011  8:29 AM  Dressing Changed New 04/30/2011  8:00 AM  Dressing Status Clean;Dry;Intact 04/30/2011  8:00 AM   Selective Debridement Selective Debridement - Location: L ankle  Selective Debridement - Tools Used: Forceps;Scalpel Selective Debridement - Tissue Removed: 60% slough Electrical Stimulation Electrical Stimulation - Location: L lateral ankle  Electrical Stimulation - Polarity: Positive # 3 Electrical Stimulation - Pulse Rate: high volt 75 Electrical Stimulation - Intensity: 150 mV Electrical Stimulation - Duration: 30 min.   Physical  Therapy Assessment and Plan   PT Assessment and Plan Clinical Impression Statement: Good amound of yellow slough removed this session.Pt continues to be sensitive to debridement requiring ice massage prior to debridement. Small inferior wound noted last session is healed. Pt requests to have estim for only 30' this session. PT Plan: Continue with negative polarity tx #2 next session.     Problem List Patient Active Problem List  Diagnoses  . HYPERLIPIDEMIA  . HYPERTENSION  . ATRIAL FIBRILLATION  . ACUTE ON CHRONIC DIASTOLIC HEART FAILURE  . Occlusion and stenosis of carotid artery without mention of cerebral infarction  . SYNCOPE AND COLLAPSE  . Long term current use of anticoagulant  . Wound of ankle    [name] 04/30/2011, 9:35 AM

## 2011-05-01 ENCOUNTER — Other Ambulatory Visit: Payer: Self-pay | Admitting: Cardiovascular Disease

## 2011-05-01 MED ORDER — CLOPIDOGREL BISULFATE 75 MG PO TABS: 75.0000 mg | ORAL_TABLET | ORAL | Status: DC

## 2011-05-04 ENCOUNTER — Ambulatory Visit (HOSPITAL_COMMUNITY)
Admission: RE | Admit: 2011-05-04 | Discharge: 2011-05-04 | Disposition: A | Discharge status: Home / Self Care | Payer: Medicare Other | Source: Ambulatory Visit | Attending: Internal Medicine | Admitting: Internal Medicine

## 2011-05-04 DIAGNOSIS — IMO0001 Reserved for inherently not codable concepts without codable children: Secondary | ICD-10-CM | POA: Diagnosis not present

## 2011-05-04 DIAGNOSIS — L97309 Non-pressure chronic ulcer of unspecified ankle with unspecified severity: Secondary | ICD-10-CM | POA: Diagnosis not present

## 2011-05-04 NOTE — Progress Notes (Signed)
Physical Therapy - Wound Therapy  Treatment  Patient Details  Name: [name] MRN: [id number] Date of Birth: Feb 17, 1941  Today's Date: 05/04/2011 Time: 4098-1191 Time Calculation (min): 44 min  Visit#: 111  of 117   Re-eval: 05/20/12 Wound Therapy Wound 05/04/10 Abrasion(s);Other (Comment) Ankle Left;Lateral scratch that turned into ulcer; 2.0cmX1cmX0.25cm with 95% slough and 5% granulation (Active)  Site / Wound Assessment Clean 05/04/2011  8:26 AM  % Wound base Red or Granulating 75% 05/04/2011  8:26 AM  % Wound base Yellow 25% 05/04/2011  8:26 AM  % Wound base Black 0% 04/30/2011  8:00 AM  % Wound base Other (Comment) 0% 04/13/2011  8:29 AM  Peri-wound Assessment Intact 05/04/2011  8:26 AM  Wound Length (cm) 1.5 cm 04/23/2011  8:00 AM  Wound Width (cm) 1 cm 04/23/2011  8:00 AM  Wound Depth (cm) 0.2 cm 04/23/2011  8:00 AM  Tunneling (cm) no tunnerling 04/04/2011  2:57 PM  Undermining (cm) no undermining  04/04/2011  2:57 PM  Margins Other (Comment) 10/23/2010  8:00 AM  Drainage Amount Minimal 05/04/2011  8:26 AM  Drainage Description Serous 05/04/2011  8:26 AM  Non-staged Wound Description Full thickness 05/04/2011  8:26 AM  Treatment Cleansed;Debridement (Selective);Electrical stimulation 05/04/2011  8:26 AM  Dressing Type Hydrogel 04/13/2011  8:29 AM  Dressing Changed New 05/04/2011  8:26 AM  Dressing Status Clean;Dry 05/04/2011  8:26 AM   Selective Debridement Selective Debridement - Tools Used: Forceps;Scalpel Selective Debridement - Tissue Removed: minimal due to pt high pain today. Pharmacologist - Location: L Lateral ankle Electrical Stimulation - Polarity: #4 Electrical Stimulation - Pulse Rate: positive on wound high volt 100 Electrical Stimulation - Duration: 30 min   Physical Therapy Assessment and Plan Wound Therapy - Assess/Plan/Recommendations Wound Therapy - Clinical Statement: Pt stive very reactive to debridement.  Will try to use tegaderm  as a dressing to see if this improves wound.  Pt advised not to take tegaderm off unless needed.   Factors Delaying/Impairing Wound Healing: Vascular compromise Hydrotherapy Plan: Dressing change;Electrical stimulation;Patient/family education;Debridement Wound Therapy - Current Recommendations: PT PT Assessment and Plan Clinical Impression Statement: Pt very reactive to debridement today.  Will assess changing dressing to tegaderm next week.    Goals  increase granulation to 100%  Problem List Patient Active Problem List  Diagnoses  . HYPERLIPIDEMIA  . HYPERTENSION  . ATRIAL FIBRILLATION  . ACUTE ON CHRONIC DIASTOLIC HEART FAILURE  . Occlusion and stenosis of carotid artery without mention of cerebral infarction  . SYNCOPE AND COLLAPSE  . Long term current use of anticoagulant  . Wound of ankle    Jahdiel Krol,[name] 05/04/2011, 8:34 AM

## 2011-05-07 ENCOUNTER — Telehealth (HOSPITAL_COMMUNITY): Payer: Self-pay

## 2011-05-07 ENCOUNTER — Ambulatory Visit (HOSPITAL_COMMUNITY): Payer: Medicare Other | Admitting: Physical Therapy

## 2011-05-07 DIAGNOSIS — M545 Low back pain: Secondary | ICD-10-CM | POA: Diagnosis not present

## 2011-05-11 ENCOUNTER — Ambulatory Visit (HOSPITAL_COMMUNITY)
Admission: RE | Admit: 2011-05-11 | Discharge: 2011-05-11 | Disposition: A | Discharge status: Home / Self Care | Payer: Medicare Other | Source: Ambulatory Visit | Attending: Internal Medicine | Admitting: Internal Medicine

## 2011-05-11 DIAGNOSIS — L97309 Non-pressure chronic ulcer of unspecified ankle with unspecified severity: Secondary | ICD-10-CM | POA: Insufficient documentation

## 2011-05-11 DIAGNOSIS — IMO0001 Reserved for inherently not codable concepts without codable children: Secondary | ICD-10-CM | POA: Insufficient documentation

## 2011-05-11 NOTE — Progress Notes (Signed)
Physical Therapy - Wound Therapy  Treatment  Patient Details  Name: [name] MRN: [id number] Date of Birth: [birth year]  Today's Date: 05/11/2011 Time: 0805-0829 Time Calculation (min): 24 min  Visit#: 112  of 117   Re-eval:    Wound Therapy Wound 05/04/10 Abrasion(s);Other (Comment) Ankle Left;Lateral scratch that turned into ulcer; 2.0cmX1cmX0.25cm with 95% slough and 5% granulation (Active)  Site / Wound Assessment Clean 05/04/2011  8:26 AM  % Wound base Red or Granulating 50% 05/11/2011  8:45 AM  % Wound base Yellow 50% 05/11/2011  8:45 AM  % Wound base Black 0% 04/30/2011  8:00 AM  % Wound base Other (Comment) 0% 04/13/2011  8:29 AM  Peri-wound Assessment Intact 05/04/2011  8:26 AM  Wound Length (cm) 1.5 cm 04/23/2011  8:00 AM  Wound Width (cm) 1 cm 04/23/2011  8:00 AM  Wound Depth (cm) 0.2 cm 04/23/2011  8:00 AM  Tunneling (cm) no tunnerling 04/04/2011  2:57 PM  Undermining (cm) no undermining  04/04/2011  2:57 PM  Margins Other (Comment) 10/23/2010  8:00 AM  Drainage Amount Minimal 05/04/2011  8:26 AM  Drainage Description Serous 05/04/2011  8:26 AM  Non-staged Wound Description Full thickness 05/11/2011  8:45 AM  Treatment Debridement (Selective) 05/11/2011  8:45 AM  Dressing Type Hydrogel 04/13/2011  8:29 AM  Dressing Changed Changed 05/11/2011  8:45 AM  Dressing Status Clean;Dry 05/11/2011  8:45 AM   Selective Debridement Selective Debridement - Location: minimal debridement done secondary to pt being very apprehensive today. Selective Debridement - Tools Used: Forceps Selective Debridement - Tissue Removed: min.  Laser 1:05 @ 40J/cm2 x 2 Physical Therapy Assessment and Plan Wound Therapy - Assess/Plan/Recommendations Factors Delaying/Impairing Wound Healing: Vascular compromise Hydrotherapy Plan: Debridement;Dressing change;Patient/family education (laser) Wound Therapy - Frequency:  (2x) PT Assessment and Plan Clinical Impression Statement: Assess how Laser changes wound status.   Pt states tegaderm did not work,(pulled off after 2 hours) Rehab Potential: Good PT Frequency: Min 2X/week PT Plan: continue debridement as pt will allow and laser    Goals    Problem List Patient Active Problem List  Diagnoses  . HYPERLIPIDEMIA  . HYPERTENSION  . ATRIAL FIBRILLATION  . ACUTE ON CHRONIC DIASTOLIC HEART FAILURE  . Occlusion and stenosis of carotid artery without mention of cerebral infarction  . SYNCOPE AND COLLAPSE  . Long term current use of anticoagulant  . Wound of ankle    RUSSELL,[name] 05/11/2011, 8:50 AM

## 2011-05-14 ENCOUNTER — Ambulatory Visit (HOSPITAL_COMMUNITY)
Admission: RE | Admit: 2011-05-14 | Discharge: 2011-05-14 | Disposition: A | Discharge status: Home / Self Care | Payer: Medicare Other | Source: Ambulatory Visit | Attending: Internal Medicine | Admitting: Internal Medicine

## 2011-05-14 DIAGNOSIS — M545 Low back pain: Secondary | ICD-10-CM | POA: Diagnosis not present

## 2011-05-14 DIAGNOSIS — M5137 Other intervertebral disc degeneration, lumbosacral region: Secondary | ICD-10-CM | POA: Diagnosis not present

## 2011-05-14 DIAGNOSIS — IMO0001 Reserved for inherently not codable concepts without codable children: Secondary | ICD-10-CM | POA: Diagnosis not present

## 2011-05-14 DIAGNOSIS — L97309 Non-pressure chronic ulcer of unspecified ankle with unspecified severity: Secondary | ICD-10-CM | POA: Diagnosis not present

## 2011-05-14 DIAGNOSIS — M999 Biomechanical lesion, unspecified: Secondary | ICD-10-CM | POA: Diagnosis not present

## 2011-05-14 NOTE — Progress Notes (Signed)
Physical Therapy - Wound Therapy  Treatment  Patient Details  Name: GLENDY BARSANTI MRN: [id number] Date of Birth: March 08, 1941  Today's Date: 05/14/2011 Time: 4098-1191 Time Calculation (min): 30 min Charges:  Debridement < 20 cm Visit#: 113  of 117   Re-eval: 05/21/11  Wound Therapy Wound 05/04/10 Abrasion(s);Other (Comment) Ankle Left;Lateral scratch that turned into ulcer; 2.0cmX1cmX0.25cm with 95% slough and 5% granulation (Active)  Site / Wound Assessment Pale;Pink;Red 05/14/2011  9:00 AM  % Wound base Red or Granulating 75% 05/14/2011  9:00 AM  % Wound base Yellow 25% 05/14/2011  9:00 AM  % Wound base Black 0% 05/14/2011  9:00 AM  % Wound base Other (Comment) 0% 05/14/2011  9:00 AM  Peri-wound Assessment Erythema (blanchable) 05/14/2011  9:00 AM  Wound Length (cm) 1.5 cm 04/23/2011  8:00 AM  Wound Width (cm) 1 cm 04/23/2011  8:00 AM  Wound Depth (cm) 0.2 cm 04/23/2011  8:00 AM  Tunneling (cm) no tunnerling 04/04/2011  2:57 PM  Undermining (cm) no undermining  04/04/2011  2:57 PM  Margins Other (Comment) 10/23/2010  8:00 AM  Drainage Amount Scant 05/14/2011  9:00 AM  Drainage Description Serous 05/04/2011  8:26 AM  Non-staged Wound Description Full thickness 05/11/2011  8:45 AM  Treatment Cleansed;Debridement (Selective);Other (Comment) 05/14/2011  9:00 AM  Dressing Type Hydrogel 05/14/2011  9:00 AM  Dressing Changed Changed 05/14/2011  9:00 AM  Dressing Status Clean;Dry;Intact 05/14/2011  9:00 AM   Modalities:  Laser Settings:  To increase circulation over bone , Chronic continuous Frequency/time/Joules:  1:45" each, 2 placements at 40-50 Joules.  Physical Therapy Assessment and Plan Wound Therapy - Assess/Plan/Recommendations Factors Delaying/Impairing Wound Healing: Vascular compromise Hydrotherapy Plan: Debridement;Dressing change (laser) PT Assessment and Plan Clinical Impression Statement: Improving granulation tissue today with less pain with debridement. PT Plan: Continue woundcare utilizing  laser treatment.     Problem List Patient Active Problem List  Diagnoses  . HYPERLIPIDEMIA  . HYPERTENSION  . ATRIAL FIBRILLATION  . ACUTE ON CHRONIC DIASTOLIC HEART FAILURE  . Occlusion and stenosis of carotid artery without mention of cerebral infarction  . SYNCOPE AND COLLAPSE  . Long term current use of anticoagulant  . Wound of ankle    Amy B. Bascom Levels, PTA 05/14/2011, 10:09 AM

## 2011-05-16 ENCOUNTER — Ambulatory Visit (HOSPITAL_COMMUNITY)
Admission: RE | Admit: 2011-05-16 | Discharge: 2011-05-16 | Disposition: A | Discharge status: Home / Self Care | Payer: Medicare Other | Source: Ambulatory Visit | Attending: Physical Therapy | Admitting: Physical Therapy

## 2011-05-16 DIAGNOSIS — L97309 Non-pressure chronic ulcer of unspecified ankle with unspecified severity: Secondary | ICD-10-CM | POA: Diagnosis not present

## 2011-05-16 DIAGNOSIS — IMO0001 Reserved for inherently not codable concepts without codable children: Secondary | ICD-10-CM | POA: Diagnosis not present

## 2011-05-16 NOTE — Progress Notes (Addendum)
Physical Therapy - Wound Therapy  Treatment  Patient Details  Name: [name] MRN: [id number] Date of Birth: 02-May-1940  Today's Date: 05/16/2011 Time: 0800-0840 Time Calculation (min): 40 min  Visit#: 800  of 840   Re-eval: 05/21/11 Charge: selective debridement < 20 cm Wound Therapy Wound 05/04/10 Abrasion(s);Other (Comment) Ankle Left;Lateral scratch that turned into ulcer; 2.0cmX1cmX0.25cm with 95% slough and 5% granulation (Active)  Site / Wound Assessment Pale;Pink;Red 05/16/2011 12:04 PM  % Wound base Red or Granulating 75% 05/14/2011  9:00 AM  % Wound base Yellow 25% 05/16/2011 12:04 PM  % Wound base Black 0% 05/16/2011 12:04 PM  % Wound base Other (Comment) 0% 05/16/2011 12:04 PM  Peri-wound Assessment Erythema (blanchable) 05/16/2011 12:04 PM  Wound Length (cm) 1.5 cm 04/23/2011  8:00 AM  Wound Width (cm) 1 cm 04/23/2011  8:00 AM  Wound Depth (cm) 0.2 cm 04/23/2011  8:00 AM  Tunneling (cm) no tunnerling 04/04/2011  2:57 PM  Undermining (cm) no undermining  04/04/2011  2:57 PM  Margins Other (Comment) 10/23/2010  8:00 AM  Drainage Amount Scant 05/16/2011 12:04 PM  Drainage Description Serous 05/16/2011 12:04 PM  Non-staged Wound Description Full thickness 05/11/2011  8:45 AM  Treatment Cleansed;Irrigation;Debridement (Selective);Other (Comment) 05/16/2011 12:04 PM  Dressing Type Hydrogel 05/16/2011 12:04 PM  Dressing Changed Changed 05/16/2011 12:04 PM  Dressing Status Clean;Dry;Intact 05/16/2011 12:04 PM   Selective Debridement Selective Debridement - Location: L ankle wound Selective Debridement - Tools Used:  (gauze) Selective Debridement - Tissue Removed: slough, dry scaley perimeter   Modalities: Laser  Settings: To increase circulation over bone , Chronic continuous  Frequency/time/Joules: 1:45" each, 2 placements at 40-50 Joules.  Physical Therapy Assessment and Plan Wound Therapy - Assess/Plan/Recommendations Wound Therapy - Clinical Statement: Improving granulation tissue today  with less pain with debridement. Factors Delaying/Impairing Wound Healing: Vascular compromise Hydrotherapy Plan: Debridement;Dressing change (laser)      Goals    Problem List Patient Active Problem List  Diagnoses  . HYPERLIPIDEMIA  . HYPERTENSION  . ATRIAL FIBRILLATION  . ACUTE ON CHRONIC DIASTOLIC HEART FAILURE  . Occlusion and stenosis of carotid artery without mention of cerebral infarction  . SYNCOPE AND COLLAPSE  . Long term current use of anticoagulant  . Wound of ankle    [name] 05/16/2011, 12:10 PM

## 2011-05-17 DIAGNOSIS — E785 Hyperlipidemia, unspecified: Secondary | ICD-10-CM | POA: Diagnosis not present

## 2011-05-17 DIAGNOSIS — I4891 Unspecified atrial fibrillation: Secondary | ICD-10-CM | POA: Diagnosis not present

## 2011-05-17 DIAGNOSIS — I10 Essential (primary) hypertension: Secondary | ICD-10-CM | POA: Diagnosis not present

## 2011-05-18 ENCOUNTER — Ambulatory Visit (HOSPITAL_COMMUNITY)
Admission: RE | Admit: 2011-05-18 | Discharge: 2011-05-18 | Disposition: A | Discharge status: Home / Self Care | Payer: Medicare Other | Source: Ambulatory Visit | Attending: Internal Medicine | Admitting: Internal Medicine

## 2011-05-18 DIAGNOSIS — L97309 Non-pressure chronic ulcer of unspecified ankle with unspecified severity: Secondary | ICD-10-CM | POA: Diagnosis not present

## 2011-05-18 DIAGNOSIS — IMO0001 Reserved for inherently not codable concepts without codable children: Secondary | ICD-10-CM | POA: Diagnosis not present

## 2011-05-18 NOTE — Progress Notes (Signed)
Physical Therapy - Wound Therapy  Treatment  Patient Details  Name: [name] MRN: [id number] Date of Birth: 04-12-1940  Today's Date: 05/18/2011 Time: 1308-6578 Time Calculation (min): 25 min  Visit#: 115  of 117   Re-eval: 05/21/11 Charge: selective debridment < 20 cm Wound Therapy Wound 05/04/10 Abrasion(s);Other (Comment) Ankle Left;Lateral scratch that turned into ulcer; 2.0cmX1cmX0.25cm with 95% slough and 5% granulation (Active)  Site / Wound Assessment Pale;Pink;Red 05/18/2011 10:53 AM  % Wound base Red or Granulating 75% 05/14/2011  9:00 AM  % Wound base Yellow 25% 05/16/2011 12:04 PM  % Wound base Black 0% 05/18/2011 10:53 AM  % Wound base Other (Comment) 0% 05/18/2011 10:53 AM  Peri-wound Assessment Erythema (blanchable) 05/18/2011 10:53 AM  Wound Length (cm) 1.5 cm 04/23/2011  8:00 AM  Wound Width (cm) 1 cm 04/23/2011  8:00 AM  Wound Depth (cm) 0.2 cm 04/23/2011  8:00 AM  Tunneling (cm) no tunnerling 04/04/2011  2:57 PM  Undermining (cm) no undermining  04/04/2011  2:57 PM  Margins Other (Comment) 10/23/2010  8:00 AM  Drainage Amount Scant 05/16/2011 12:04 PM  Drainage Description Serous 05/16/2011 12:04 PM  Non-staged Wound Description Full thickness 05/11/2011  8:45 AM  Treatment Cleansed;Debridement (Selective);Irrigation 05/18/2011 10:53 AM  Dressing Type Hydrogel 05/18/2011 10:53 AM  Dressing Changed Changed 05/18/2011 10:53 AM  Dressing Status Clean;Dry;Intact 05/18/2011 10:53 AM   Selective Debridement Selective Debridement - Location: L ankle wound Selective Debridement - Tools Used: Forceps;Scalpel Selective Debridement - Tissue Removed: slough, dry scaley perimeter   Modalities: Laser  Settings: To increase circulation over bone , Chronic continuous  Frequency/time/Joules: 1:45" each, 2 placements at 40-50 Joules.  Physical Therapy Assessment and Plan Wound Therapy - Assess/Plan/Recommendations Wound Therapy - Clinical Statement: Increased slough today able to remove with  debridement, improving granulation following treatment.  No c/o of pain with total treatment. Hydrotherapy Plan: Debridement;Dressing change;Other (comment) (lazer)      Goals    Problem List Patient Active Problem List  Diagnoses  . HYPERLIPIDEMIA  . HYPERTENSION  . ATRIAL FIBRILLATION  . ACUTE ON CHRONIC DIASTOLIC HEART FAILURE  . Occlusion and stenosis of carotid artery without mention of cerebral infarction  . SYNCOPE AND COLLAPSE  . Long term current use of anticoagulant  . Wound of ankle    [name] 05/18/2011, 12:05 PM

## 2011-05-21 ENCOUNTER — Ambulatory Visit (INDEPENDENT_AMBULATORY_CARE_PROVIDER_SITE_OTHER): Payer: Medicare Other | Admitting: *Deleted

## 2011-05-21 ENCOUNTER — Ambulatory Visit (HOSPITAL_COMMUNITY)
Admission: RE | Admit: 2011-05-21 | Discharge: 2011-05-21 | Disposition: A | Discharge status: Home / Self Care | Payer: Medicare Other | Source: Ambulatory Visit | Attending: Internal Medicine | Admitting: Internal Medicine

## 2011-05-21 DIAGNOSIS — IMO0001 Reserved for inherently not codable concepts without codable children: Secondary | ICD-10-CM | POA: Diagnosis not present

## 2011-05-21 DIAGNOSIS — I4891 Unspecified atrial fibrillation: Secondary | ICD-10-CM | POA: Diagnosis not present

## 2011-05-21 DIAGNOSIS — Z7901 Long term (current) use of anticoagulants: Secondary | ICD-10-CM | POA: Diagnosis not present

## 2011-05-21 DIAGNOSIS — L97309 Non-pressure chronic ulcer of unspecified ankle with unspecified severity: Secondary | ICD-10-CM | POA: Diagnosis not present

## 2011-05-21 NOTE — Progress Notes (Signed)
Physical Therapy - Wound Therapy  Treatment  Patient Details  Name: [name] MRN: [id number] Date of Birth: Feb 16, 1941  Today's Date: 05/21/2011 Time: 1191-4782 Time Calculation (min): 35 min Charges:  Debridement < 20cm; Laser 1:54X2 treatments (not billed) Visit#: 116  of 117   Re-eval: 05/23/11  Wound Therapy Wound 05/04/10 Abrasion(s);Other (Comment) Ankle Left;Lateral scratch that turned into ulcer; 2.0cmX1cmX0.25cm with 95% slough and 5% granulation (Active)  Site / Wound Assessment Pale;Pink;Red 05/21/2011  8:00 AM  % Wound base Red or Granulating 75% 05/21/2011  8:00 AM  % Wound base Yellow 25% 05/21/2011  8:00 AM  % Wound base Black 0% 05/18/2011 10:53 AM  % Wound base Other (Comment) 0% 05/18/2011 10:53 AM  Peri-wound Assessment Erythema (blanchable) 05/21/2011  8:00 AM  Wound Length (cm) 1.5 cm 04/23/2011  8:00 AM  Wound Width (cm) 1 cm 04/23/2011  8:00 AM  Wound Depth (cm) 0.2 cm 04/23/2011  8:00 AM  Tunneling (cm) no tunnerling 04/04/2011  2:57 PM  Undermining (cm) no undermining  04/04/2011  2:57 PM  Margins Other (Comment) 10/23/2010  8:00 AM  Drainage Amount Scant 05/16/2011 12:04 PM  Drainage Description Serous 05/16/2011 12:04 PM  Non-staged Wound Description Full thickness 05/11/2011  8:45 AM  Treatment Cleansed;Debridement (Selective);Packing (Saline gauze) 05/21/2011  8:00 AM  Dressing Type Hydrogel 05/21/2011  8:00 AM  Dressing Changed Changed 05/21/2011  8:00 AM  Dressing Status Clean;Dry;Intact 05/21/2011  8:00 AM   Selective Debridement Selective Debridement - Location: L ankle wound Selective Debridement - Tools Used: Forceps;Scalpel Selective Debridement - Tissue Removed: slough, dry scaley perimeter   Physical Therapy Assessment and Plan Wound Therapy - Assess/Plan/Recommendations Wound Therapy - Clinical Statement: Slough outter edges easily removed with debridement. Hydrotherapy Plan: Debridement;Dressing change;Other (comment) (Laser therapy) PT Assessment and  Plan Clinical Impression Statement: Slough continues to decrease with improvement overall.  Appears to be responding well to Laser therapy. PT Plan: Re-eval next visit /Measure; Continue with laser therapy.     Problem List Patient Active Problem List  Diagnoses  . HYPERLIPIDEMIA  . HYPERTENSION  . ATRIAL FIBRILLATION  . ACUTE ON CHRONIC DIASTOLIC HEART FAILURE  . Occlusion and stenosis of carotid artery without mention of cerebral infarction  . SYNCOPE AND COLLAPSE  . Long term current use of anticoagulant  . Wound of ankle    Paz Fuentes B. Bascom Levels, PTA 05/21/2011, 8:53 AM

## 2011-05-23 ENCOUNTER — Ambulatory Visit (HOSPITAL_COMMUNITY)
Admission: RE | Admit: 2011-05-23 | Discharge: 2011-05-23 | Disposition: A | Discharge status: Home / Self Care | Payer: Medicare Other | Source: Ambulatory Visit | Attending: Internal Medicine | Admitting: Internal Medicine

## 2011-05-23 DIAGNOSIS — IMO0001 Reserved for inherently not codable concepts without codable children: Secondary | ICD-10-CM | POA: Diagnosis not present

## 2011-05-23 DIAGNOSIS — L97309 Non-pressure chronic ulcer of unspecified ankle with unspecified severity: Secondary | ICD-10-CM | POA: Diagnosis not present

## 2011-05-23 NOTE — Progress Notes (Signed)
Physical Therapy - Wound Therapy  Treatment  Patient Details  Name: DARRELLE BARRELL MRN: [id number] Date of Birth: 03/26/1941  Today's Date: 05/23/2011 Time: 4098-1191 Time Calculation (min): 31 min  Visit#: 117  of 125   Re-eval: 06/20/11 Charges: Selective debridement (= or < 20 cm)   Wound Therapy Wound 05/04/10 Abrasion(s);Other (Comment) Ankle Left;Lateral scratch that turned into ulcer; 2.0cmX1cmX0.25cm with 95% slough and 5% granulation (Active)  Site / Wound Assessment Pink;Red 05/23/2011  8:00 AM  % Wound base Red or Granulating 75% 05/21/2011  8:00 AM  % Wound base Yellow 25% 05/21/2011  8:00 AM  % Wound base Black 0% 05/18/2011 10:53 AM  % Wound base Other (Comment) 0% 05/18/2011 10:53 AM  Peri-wound Assessment Erythema (blanchable) 05/23/2011  8:00 AM  Wound Length (cm) 1.4 cm 05/23/2011  8:00 AM  Wound Width (cm) 0.4 cm 05/23/2011  8:00 AM  Wound Depth (cm) 0.1 cm 05/23/2011  8:00 AM  Tunneling (cm) no tunnerling 04/04/2011  2:57 PM  Undermining (cm) no undermining  04/04/2011  2:57 PM  Margins Other (Comment) 10/23/2010  8:00 AM  Drainage Amount Scant 05/16/2011 12:04 PM  Drainage Description Serous 05/16/2011 12:04 PM  Non-staged Wound Description Full thickness 05/11/2011  8:45 AM  Treatment Cleansed;Debridement (Selective) 05/23/2011  8:00 AM  Dressing Type Hydrogel 05/23/2011  8:00 AM  Dressing Changed Changed 05/23/2011  8:00 AM  Dressing Status Clean;Dry;Intact 05/23/2011  8:00 AM   Selective Debridement Selective Debridement - Location: L ankle wound Selective Debridement - Tools Used: Forceps Selective Debridement - Tissue Removed: slough, dry scaley perimeter  Modalities: Laser  Settings: To increase circulation over bone , Chronic continuous  Frequency/time/Joules: 1:45" each, 2 placements at 40-50 Joules.  Physical Therapy Assessment and Plan Wound Therapy - Assess/Plan/Recommendations Wound Therapy - Clinical Statement: Granulation continues to improve. Increase  circulation noted since starting laser. Some erythema noted around wound perimeter. Wound has decreased in size since last measurement. Pt is without pain. Hydrotherapy Plan: Debridement;Dressing change;Other (comment) (Laser) PT Assessment and Plan Clinical Impression Statement: Granulation continues to improve. Increase circulation noted since starting laser. Some erythema noted around wound perimeter. Wound has decreased in size since last measurement. Pt is without pain. PT Plan: Recommend to PT to continue x 4 more weeks.    Goals PT Short Term Goals PT Short Term Goal 1: Necrotic tissue to be 0%, granulation to be 100%  PT Short Term Goal 1 - Progress: Progressing toward goal PT Short Term Goal 2: Pt/family will be independent with dressing changes/self-care PT Short Term Goal 2 - Progress: Met PT Short Term Goal 3: Decreased wound size by 1.5cm X 0.5cm X 0.25cm  PT Short Term Goal 3 - Progress: Met PT Short Term Goal 4: Decreased drainage equal or less than scant  PT Short Term Goal 4 - Progress: Progressing toward goal  Problem List Patient Active Problem List  Diagnoses  . HYPERLIPIDEMIA  . HYPERTENSION  . ATRIAL FIBRILLATION  . ACUTE ON CHRONIC DIASTOLIC HEART FAILURE  . Occlusion and stenosis of carotid artery without mention of cerebral infarction  . SYNCOPE AND COLLAPSE  . Long term current use of anticoagulant  . Wound of ankle    [name] 05/23/2011, 8:56 AM

## 2011-05-25 ENCOUNTER — Ambulatory Visit (HOSPITAL_COMMUNITY)
Admission: RE | Admit: 2011-05-25 | Discharge: 2011-05-25 | Disposition: A | Discharge status: Home / Self Care | Payer: Medicare Other | Source: Ambulatory Visit | Attending: Internal Medicine | Admitting: Internal Medicine

## 2011-05-25 DIAGNOSIS — IMO0001 Reserved for inherently not codable concepts without codable children: Secondary | ICD-10-CM | POA: Diagnosis not present

## 2011-05-25 DIAGNOSIS — L97309 Non-pressure chronic ulcer of unspecified ankle with unspecified severity: Secondary | ICD-10-CM | POA: Diagnosis not present

## 2011-05-25 NOTE — Progress Notes (Addendum)
Physical Therapy - Wound Therapy  Treatment  Patient Details  Name: [name] MRN: [id number] Date of Birth: Feb 27, 1941  Today's Date: 05/25/2011 Time: 7829-5621 Time Calculation (min): 30 min Charges:  Deb < 20cm Visit#: 118  of 125   Re-eval: 06/20/11  Wound Therapy Wound 05/04/10 Abrasion(s);Other (Comment) Ankle Left;Lateral scratch that turned into ulcer; 2.0cmX1cmX0.25cm with 95% slough and 5% granulation (Active)  Site / Wound Assessment Pink;Red 05/25/2011  9:00 AM  % Wound base Red or Granulating 75% 05/21/2011  8:00 AM  % Wound base Yellow 25% 05/21/2011  8:00 AM  % Wound base Black 0% 05/18/2011 10:53 AM  % Wound base Other (Comment) 0% 05/18/2011 10:53 AM  Peri-wound Assessment Erythema (blanchable) 05/25/2011  9:00 AM  Wound Length (cm) 1.4 cm 05/23/2011  8:00 AM  Wound Width (cm) 0.4 cm 05/23/2011  8:00 AM  Wound Depth (cm) 0.1 cm 05/23/2011  8:00 AM  Tunneling (cm) no tunnerling 04/04/2011  2:57 PM  Undermining (cm) no undermining  04/04/2011  2:57 PM  Margins Other (Comment) 10/23/2010  8:00 AM  Drainage Amount Scant 05/16/2011 12:04 PM  Drainage Description Serous 05/16/2011 12:04 PM  Non-staged Wound Description Full thickness 05/11/2011  8:45 AM  Treatment Cleansed 05/25/2011  9:00 AM  Dressing Type Hydrogel 05/25/2011  9:00 AM  Dressing Changed Changed 05/25/2011  9:00 AM  Dressing Status Clean;Dry;Intact 05/25/2011  9:00 AM   Modalites:  Laser therapy; settings:  Increase circulation over bone, continuous chronic, 1:45 X 2 positions at 45 J/cm2 Selective Debridement - Location: L ankle wound Selective Debridement - Tools Used: Other (comment) (gauze/qtip) Selective Debridement - Tissue Removed: slough, dry scaley perimeter   Physical Therapy Assessment and Plan Wound Therapy - Assess/Plan/Recommendations Hydrotherapy Plan: Debridement;Dressing change;Other (comment) PT Assessment and Plan Clinical Impression Statement: Wound continues to increase in granulation with  laser/debridement.  Less erythema around wound perimeter but with dryness/scaliness.  Vaseline applied to perimeter. PT Plan: Continue X 1 more visit then pt. will be on vacation X 1 week.  Pt. has 4 weeks remaining on current script.     Problem List Patient Active Problem List  Diagnoses  . HYPERLIPIDEMIA  . HYPERTENSION  . ATRIAL FIBRILLATION  . ACUTE ON CHRONIC DIASTOLIC HEART FAILURE  . Occlusion and stenosis of carotid artery without mention of cerebral infarction  . SYNCOPE AND COLLAPSE  . Long term current use of anticoagulant  . Wound of ankle    [name] Levels, PTA 05/25/2011, 9:38 AM

## 2011-05-28 ENCOUNTER — Ambulatory Visit (HOSPITAL_COMMUNITY)
Admission: RE | Admit: 2011-05-28 | Discharge: 2011-05-28 | Disposition: A | Discharge status: Home / Self Care | Payer: Medicare Other | Source: Ambulatory Visit | Attending: Internal Medicine | Admitting: Internal Medicine

## 2011-05-28 DIAGNOSIS — L97309 Non-pressure chronic ulcer of unspecified ankle with unspecified severity: Secondary | ICD-10-CM | POA: Diagnosis not present

## 2011-05-28 DIAGNOSIS — IMO0001 Reserved for inherently not codable concepts without codable children: Secondary | ICD-10-CM | POA: Diagnosis not present

## 2011-05-28 NOTE — Progress Notes (Signed)
Physical Therapy - Wound Therapy  Treatment  Patient Details  Name: [name] MRN: [id number] Date of Birth: 05/27/1940  Today's Date: 05/28/2011 Time: 4098-1191 Time Calculation (min): 25 min  Visit#: 119  of 125   Re-eval: 06/08/11  Wound Therapy Wound 05/04/10 Abrasion(s);Other (Comment) Ankle Left;Lateral scratch that turned into ulcer; 2.0cmX1cmX0.25cm with 95% slough and 5% granulation (Active)  Site / Wound Assessment Clean;Dry 05/28/2011  8:33 AM  % Wound base Red or Granulating 75% 05/21/2011  8:00 AM  % Wound base Yellow 25% 05/21/2011  8:00 AM  % Wound base Black 0% 05/18/2011 10:53 AM  % Wound base Other (Comment) 0% 05/18/2011 10:53 AM  Peri-wound Assessment Intact 05/28/2011  8:33 AM  Wound Length (cm) 1.4 cm 05/23/2011  8:00 AM  Wound Width (cm) 0.4 cm 05/23/2011  8:00 AM  Wound Depth (cm) 0.1 cm 05/23/2011  8:00 AM  Tunneling (cm) no tunnerling 04/04/2011  2:57 PM  Undermining (cm) no undermining  04/04/2011  2:57 PM  Margins Other (Comment) 10/23/2010  8:00 AM  Drainage Amount Scant 05/28/2011  8:33 AM  Drainage Description Serous 05/28/2011  8:33 AM  Non-staged Wound Description Full thickness 05/11/2011  8:45 AM  Treatment Debridement (Selective) 05/28/2011  8:33 AM  Dressing Type Hydrogel 05/25/2011  9:00 AM  Dressing Changed Changed 05/28/2011  8:33 AM  Dressing Status Clean;Dry;Intact 05/25/2011  9:00 AM   Selective Debridement Selective Debridement - Location: L ankle Selective Debridement - Tools Used: Forceps Selective Debridement - Tissue Removed: dry scaley perimeter, slough Electrical Stimulation Electrical Stimulation - Location: Laser to lateral aspect of ankle  Electrical Stimulation - Polarity: continuous chronic bone  Electrical Stimulation - Pulse Rate: 70J/cm2 x 2 1:45   Physical Therapy Assessment and Plan Wound Therapy - Assess/Plan/Recommendations Wound Therapy - Clinical Statement: Pt will take a two week break and come for reassessment on March  1st.  If wound has not back slide we will D/C to home care. Hydrotherapy Plan: Debridement;Other (comment) (laser ) Wound Therapy - Frequency:  (2x wk)      Goals    Problem List Patient Active Problem List  Diagnoses  . HYPERLIPIDEMIA  . HYPERTENSION  . ATRIAL FIBRILLATION  . ACUTE ON CHRONIC DIASTOLIC HEART FAILURE  . Occlusion and stenosis of carotid artery without mention of cerebral infarction  . SYNCOPE AND COLLAPSE  . Long term current use of anticoagulant  . Wound of ankle    Toryn Mcclinton,[name] 05/28/2011, 8:39 AM

## 2011-05-29 ENCOUNTER — Other Ambulatory Visit (HOSPITAL_COMMUNITY): Payer: Self-pay | Admitting: Internal Medicine

## 2011-05-29 DIAGNOSIS — R748 Abnormal levels of other serum enzymes: Secondary | ICD-10-CM

## 2011-06-01 ENCOUNTER — Ambulatory Visit (HOSPITAL_COMMUNITY): Admission: RE | Admit: 2011-06-01 | Payer: Medicare Other | Source: Ambulatory Visit

## 2011-06-08 ENCOUNTER — Ambulatory Visit (HOSPITAL_COMMUNITY)
Admission: RE | Admit: 2011-06-08 | Discharge: 2011-06-08 | Disposition: A | Discharge status: Home / Self Care | Payer: Medicare Other | Source: Ambulatory Visit | Attending: Internal Medicine | Admitting: Internal Medicine

## 2011-06-08 ENCOUNTER — Other Ambulatory Visit (HOSPITAL_COMMUNITY): Payer: Self-pay | Admitting: Internal Medicine

## 2011-06-08 ENCOUNTER — Other Ambulatory Visit: Payer: Self-pay

## 2011-06-08 DIAGNOSIS — IMO0001 Reserved for inherently not codable concepts without codable children: Secondary | ICD-10-CM | POA: Diagnosis not present

## 2011-06-08 DIAGNOSIS — L97309 Non-pressure chronic ulcer of unspecified ankle with unspecified severity: Secondary | ICD-10-CM | POA: Insufficient documentation

## 2011-06-08 DIAGNOSIS — R748 Abnormal levels of other serum enzymes: Secondary | ICD-10-CM

## 2011-06-08 MED ORDER — METOPROLOL TARTRATE 25 MG PO TABS: 75.0000 mg | ORAL_TABLET | Freq: Two times a day (BID) | ORAL | Status: DC

## 2011-06-08 NOTE — Patient Instructions (Signed)
Complete retromassage at home

## 2011-06-08 NOTE — Progress Notes (Signed)
Physical Therapy - Wound Therapy  Treatment  Patient Details  Name: [name] MRN: [id number] Date of Birth: Apr 25, 1940  Today's Date: 06/08/2011 Time: 4098-1191 Time Calculation (min): 36 min  Visit#: 120  of 124   Re-eval: 06/15/11 Charge:  Massage x 2 Pt needs wound to be measured next treatment Wound Therapy Wound 05/04/10 Abrasion(s);Other (Comment) Ankle Left;Lateral scratch that turned into ulcer; 2.0cmX1cmX0.25cm with 95% slough and 5% granulation (Active)  Site / Wound Assessment Clean 06/08/2011  9:36 AM  % Wound base Red or Granulating 75% 05/21/2011  8:00 AM  % Wound base Yellow 25% 05/21/2011  8:00 AM  % Wound base Black 0% 05/18/2011 10:53 AM  % Wound base Other (Comment) 0% 05/18/2011 10:53 AM  Peri-wound Assessment Intact 06/08/2011  9:36 AM  Wound Length (cm) 1.4 cm 05/23/2011  8:00 AM  Wound Width (cm) 0.4 cm 05/23/2011  8:00 AM  Wound Depth (cm) 0.1 cm 05/23/2011  8:00 AM  Tunneling (cm) no tunnerling 04/04/2011  2:57 PM  Undermining (cm) no undermining  04/04/2011  2:57 PM  Margins Attached edges (approximated) 06/08/2011  9:36 AM  Drainage Amount Scant 06/08/2011  9:36 AM  Drainage Description Serous 06/08/2011  9:36 AM  Non-staged Wound Description Full thickness 06/08/2011  9:36 AM  Treatment Cleansed 06/08/2011  9:36 AM  Dressing Type Hydrogel 05/25/2011  9:00 AM  Dressing Changed Changed 06/08/2011  9:36 AM  Dressing Status Clean;Dry 06/08/2011  9:36 AM   Selective Debridement Selective Debridement - Location: L ankle Selective Debridement - Tools Used: Forceps;Scissors Selective Debridement - Tissue Removed: dry scaley perimeter    Physical Therapy Assessment and Plan Wound Therapy - Assess/Plan/Recommendations Wound Therapy - Clinical Statement: Wound continues to be healing; LE with moderate swelling today.  Worked on Chiropodist with significant improvement seen during treatment time. Factors Delaying/Impairing Wound Healing: Vascular compromise Hydrotherapy Plan:   (CarraKlenz scrub with 2x2 f/b retromassage and laser) Wound Therapy - Frequency:  (decrease to one time a week) PT Assessment and Plan Clinical Impression Statement: Improved granulation.  Decreased swelling after retromassage. Rehab Potential: Good PT Frequency: Min 1X/week PT Duration: 4 weeks PT Treatment/Interventions:  (retromassage, debridement with 2x2 and Laser treatment)    Goals    Problem List Patient Active Problem List  Diagnoses  . HYPERLIPIDEMIA  . HYPERTENSION  . ATRIAL FIBRILLATION  . ACUTE ON CHRONIC DIASTOLIC HEART FAILURE  . Occlusion and stenosis of carotid artery without mention of cerebral infarction  . SYNCOPE AND COLLAPSE  . Long term current use of anticoagulant  . Wound of ankle    GP No functional reporting required  Carlethia Mesquita,[name] 06/08/2011, 9:46 AM

## 2011-06-11 DIAGNOSIS — M5137 Other intervertebral disc degeneration, lumbosacral region: Secondary | ICD-10-CM | POA: Diagnosis not present

## 2011-06-11 DIAGNOSIS — IMO0002 Reserved for concepts with insufficient information to code with codable children: Secondary | ICD-10-CM | POA: Diagnosis not present

## 2011-06-11 DIAGNOSIS — M999 Biomechanical lesion, unspecified: Secondary | ICD-10-CM | POA: Diagnosis not present

## 2011-06-11 DIAGNOSIS — M545 Low back pain: Secondary | ICD-10-CM | POA: Diagnosis not present

## 2011-06-12 ENCOUNTER — Ambulatory Visit (HOSPITAL_COMMUNITY)
Admission: RE | Admit: 2011-06-12 | Discharge: 2011-06-12 | Disposition: A | Discharge status: Home / Self Care | Payer: Medicare Other | Source: Ambulatory Visit | Attending: Internal Medicine | Admitting: Internal Medicine

## 2011-06-12 ENCOUNTER — Other Ambulatory Visit (HOSPITAL_COMMUNITY): Payer: Self-pay | Admitting: Internal Medicine

## 2011-06-12 DIAGNOSIS — R748 Abnormal levels of other serum enzymes: Secondary | ICD-10-CM

## 2011-06-12 DIAGNOSIS — R7989 Other specified abnormal findings of blood chemistry: Secondary | ICD-10-CM | POA: Diagnosis not present

## 2011-06-12 DIAGNOSIS — R932 Abnormal findings on diagnostic imaging of liver and biliary tract: Secondary | ICD-10-CM | POA: Diagnosis not present

## 2011-06-12 DIAGNOSIS — K824 Cholesterolosis of gallbladder: Secondary | ICD-10-CM | POA: Diagnosis not present

## 2011-06-13 DIAGNOSIS — Z8679 Personal history of other diseases of the circulatory system: Secondary | ICD-10-CM | POA: Diagnosis not present

## 2011-06-13 DIAGNOSIS — G250 Essential tremor: Secondary | ICD-10-CM | POA: Diagnosis not present

## 2011-06-13 DIAGNOSIS — G252 Other specified forms of tremor: Secondary | ICD-10-CM | POA: Diagnosis not present

## 2011-06-13 DIAGNOSIS — R413 Other amnesia: Secondary | ICD-10-CM | POA: Diagnosis not present

## 2011-06-14 ENCOUNTER — Ambulatory Visit (HOSPITAL_COMMUNITY)
Admission: RE | Admit: 2011-06-14 | Discharge: 2011-06-14 | Disposition: A | Discharge status: Home / Self Care | Payer: Medicare Other | Source: Ambulatory Visit | Attending: Internal Medicine | Admitting: Internal Medicine

## 2011-06-14 DIAGNOSIS — L97309 Non-pressure chronic ulcer of unspecified ankle with unspecified severity: Secondary | ICD-10-CM | POA: Diagnosis not present

## 2011-06-14 DIAGNOSIS — IMO0001 Reserved for inherently not codable concepts without codable children: Secondary | ICD-10-CM | POA: Diagnosis not present

## 2011-06-14 NOTE — Progress Notes (Signed)
Physical Therapy - Wound Therapy  Evaluation-reassessment  Patient Details  Name: [name] MRN: [id number] Date of Birth: 1940/11/04  Today's Date: 06/14/2011 Time: 0805-0830 Time Calculation (min): 25 min  Visit#: 121  of 124   Re-eval: 06/28/11 Charge:  Self care 22 Wound Therapy Wound 05/04/10 Abrasion(s);Other (Comment) Ankle Left;Lateral scratch that turned into ulcer; 2.0cmX1cmX0.25cm with 95% slough and 5% granulation (Active)  Site / Wound Assessment Granulation tissue 06/14/2011  8:37 AM  % Wound base Red or Granulating 100% 06/14/2011  8:37 AM  % Wound base Yellow 0% 06/14/2011  8:37 AM  % Wound base Black 0% 06/14/2011  8:37 AM  % Wound base Other (Comment) 0% 05/18/2011 10:53 AM  Peri-wound Assessment Intact 06/14/2011  8:37 AM  Wound Length (cm) 1 cm 06/14/2011  8:37 AM  Wound Width (cm) 0.2 cm 06/14/2011  8:37 AM  Wound Depth (cm) 0.1 cm 06/14/2011  8:37 AM  Tunneling (cm) none 06/14/2011  8:37 AM  Undermining (cm) none 06/14/2011  8:37 AM  Margins Attached edges (approximated) 06/14/2011  8:37 AM  Drainage Amount Scant 06/14/2011  8:37 AM  Drainage Description Serous 06/14/2011  8:37 AM  Non-staged Wound Description Partial thickness 06/14/2011  8:37 AM  Treatment Cleansed 06/08/2011  9:36 AM  Dressing Type Hydrogel 05/25/2011  9:00 AM  Dressing Changed Changed 06/14/2011  8:37 AM  Dressing Status Clean 06/14/2011  8:37 AM       Physical Therapy Assessment and Plan Wound Therapy - Assess/Plan/Recommendations Wound Therapy - Clinical Statement: Wound continues to fill in.  Pt demonstrated how she was performing retromassage.  Therapist corrected technique and reviewed proper technique. Factors Delaying/Impairing Wound Healing: Vascular compromise Hydrotherapy Plan: Dressing change;Patient/family education;Other (comment) (Laser to increase bone circulation chronic continuous 75J/cm) Wound Therapy - Frequency:  (one time a week) Wound Therapy - Current Recommendations: PT PT Assessment and  Plan Clinical Impression Statement: Pt wound continues to decrease in size.  Swelling decreased but not totally gone yet.  Rehab Potential: Good PT Frequency: Min 1X/week PT Duration:  (2 weeks) PT Treatment/Interventions: Patient/family education (Laser x 2 to wound) PT Plan: contintue one time a week for two more weeks.    Goals    Problem List Patient Active Problem List  Diagnoses  . HYPERLIPIDEMIA  . HYPERTENSION  . ATRIAL FIBRILLATION  . ACUTE ON CHRONIC DIASTOLIC HEART FAILURE  . Occlusion and stenosis of carotid artery without mention of cerebral infarction  . SYNCOPE AND COLLAPSE  . Long term current use of anticoagulant  . Wound of ankle    GP No functional reporting required  Kermit Arnette,[name] 06/14/2011, 8:48 AM

## 2011-06-18 ENCOUNTER — Ambulatory Visit (INDEPENDENT_AMBULATORY_CARE_PROVIDER_SITE_OTHER): Payer: Medicare Other | Admitting: *Deleted

## 2011-06-18 ENCOUNTER — Other Ambulatory Visit: Payer: Self-pay | Admitting: *Deleted

## 2011-06-18 DIAGNOSIS — Z7901 Long term (current) use of anticoagulants: Secondary | ICD-10-CM | POA: Diagnosis not present

## 2011-06-18 DIAGNOSIS — I4891 Unspecified atrial fibrillation: Secondary | ICD-10-CM | POA: Diagnosis not present

## 2011-06-18 NOTE — Patient Instructions (Signed)
Continue same dose and return in 1 month.

## 2011-06-19 ENCOUNTER — Other Ambulatory Visit (HOSPITAL_COMMUNITY): Payer: Medicare Other

## 2011-06-19 ENCOUNTER — Encounter: Payer: Self-pay | Admitting: Cardiovascular Disease

## 2011-06-19 ENCOUNTER — Ambulatory Visit (INDEPENDENT_AMBULATORY_CARE_PROVIDER_SITE_OTHER): Payer: Medicare Other | Admitting: Cardiovascular Disease

## 2011-06-19 VITALS — BP 110/66 | HR 64 | Ht 65.0 in | Wt 121.0 lb

## 2011-06-19 DIAGNOSIS — I6350 Cerebral infarction due to unspecified occlusion or stenosis of unspecified cerebral artery: Secondary | ICD-10-CM | POA: Diagnosis not present

## 2011-06-19 DIAGNOSIS — I639 Cerebral infarction, unspecified: Secondary | ICD-10-CM

## 2011-06-19 MED ORDER — AMLODIPINE BESYLATE-VALSARTAN 5-160 MG PO TABS: 1.0000 | ORAL_TABLET | Freq: Every day | ORAL | Status: DC

## 2011-06-19 NOTE — Assessment & Plan Note (Signed)
Moderate known disease  Duplex at [facility] next week

## 2011-06-19 NOTE — Assessment & Plan Note (Signed)
Home readings low and postural symptoms  Decrease exforge to 5/160

## 2011-06-19 NOTE — Assessment & Plan Note (Signed)
Maint NSR on low dose amiodarone Coumadin for PAF and CVA in 2008

## 2011-06-19 NOTE — Assessment & Plan Note (Signed)
F/U wound center Dressing not undone today

## 2011-06-19 NOTE — Progress Notes (Signed)
[name] is seen for F/U of PAF, bruit, right subclavian steal and anticoagulatin She has known vascular disease primarily in the sublavian and carotids. She was admitted with syncope last year . She had PAF with diastolic heart failure. Her cath showed only diagonal disease with no critical major vessel disease. EF was normal. 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 She has 60-79% stenosis on left ICA. In combination with rapid atrial fibrillation this may have contributed to a brief loss of consdousness. Her diastolic CHF cleared with restoration of NSR. She was sent home on low dose amiodarone. She has had no SOB, or palpitations since being home. there have been no bleeding problems. She denies SSCP, Has had an ulcer on the lateral aspect of the left ankle. Seeing wound care center and has been on antibiotics This has made her coumadin hard to control.  Reviewed duplex from [facility] 9[facility]1 60-79% LICA stenosis. Innominate stenosis.  Has F/U duplex with [facility] next week   Nice trip to [place] especially [place] and [place].  Wound on left ankle healing Postural symptoms since being on exforge  ROS: Denies fever, malais, weight loss, blurry vision, decreased visual acuity, cough, sputum, SOB, hemoptysis, pleuritic pain, palpitaitons, heartburn, abdominal pain, melena, lower extremity edema, claudication, or rash.  All other systems reviewed and negative  General: Affect appropriate Chronically ill female HEENT: normal Neck supple with no adenopathy JVP normal bilateral  bruits no thyromegaly Lungs clear with no wheezing and good diaphragmatic motion Heart:  S1/S2 no murmur, no rub, gallop or click PMI normal Abdomen: benighn, BS positve, no tenderness, no AAA no bruit.  No HSM or HJR Distal pulses intact with no bruits Bilateral edema with venous ulcer laterla malleolus left leg Neuro non-focal Skin warm and dry No muscular  weakness   Current Outpatient Prescriptions  Medication Sig Dispense Refill  . alendronate (FOSAMAX) 70 MG tablet Take 70 mg by mouth every 7 (seven) days. Take in the morning with a full glass of water, on an empty stomach, and do not take anything else by mouth or lie down for the next 30 min. Patient take on Sunday      . amiodarone (PACERONE) 200 MG tablet Take 1 tablet (200 mg total) by mouth 2 (two) times daily.  180 tablet  2  . amLODipine-valsartan (EXFORGE) 10-320 MG per tablet Take 1 tablet by mouth daily.        Marland Kitchen ascorbic acid (VITAMIN C) 500 MG tablet Take 500 mg by mouth every morning.        Marland Kitchen aspirin EC 81 MG tablet Take 81 mg by mouth daily.        . calcium-vitamin D (OSCAL) 250-125 MG-UNIT per tablet Take 1 tablet by mouth 2 (two) times daily.        . clopidogrel (PLAVIX) 75 MG tablet Take 1 tablet (75 mg total) by mouth every morning.  90 tablet  3  . colesevelam (WELCHOL) 625 MG tablet Take 3 tablets (1,875 mg total) by mouth 3 (three) times daily.  [id number] tablet  2  . ferrous sulfate 325 (65 FE) MG EC tablet Take 1 tablet (325 mg total) by mouth 2 (two) times daily.      . fish oil-omega-3 fatty acids 1000 MG capsule Take by mouth daily.        Marland Kitchen HYDROcodone-acetaminophen (NORCO) 5-325 MG per tablet Take 1 tablet by mouth every 6 (six)  hours as needed. pain      . meloxicam (MOBIC) 15 MG tablet Take 15 mg by mouth every morning.        . metoprolol tartrate (LOPRESSOR) 25 MG tablet Take 3 tablets (75 mg total) by mouth 2 (two) times daily.  90 tablet  3  . Multiple Vitamin (MULTIVITAMIN) capsule Take 1 capsule by mouth daily.        . pravastatin (PRAVACHOL) 40 MG tablet Take 1 tablet (40 mg total) by mouth at bedtime.  90 tablet  3  . warfarin (COUMADIN) 4 MG tablet Take 4 mg by mouth daily. Coumadin 4mg  on Sund, Tuesday, Thursday., 2mg  on Monday, Wednesday, Friday and Saturday        Allergies  Iodine and Iohexol  Electrocardiogram:  Assessment and Plan

## 2011-06-19 NOTE — Assessment & Plan Note (Signed)
Cholesterol is at goal.  Continue current dose of statin and diet Rx.  No myalgias or side effects.  F/U  LFT's in 6 months. Lab Results  Component Value Date   LDLCALC 60 08/11/2010             

## 2011-06-19 NOTE — Patient Instructions (Addendum)
Your physician wants you to follow-up in: 6 MONTHS WITH DR Eden Emms  IN [place]  You will receive a reminder letter in the mail two months in advance. If you don't receive a letter, please call our office to schedule the follow-up appointment. Your physician has recommended you make the following change in your medication: DECREASE EXFORGE 5/160 MG  EVERY DAY

## 2011-06-21 ENCOUNTER — Ambulatory Visit (HOSPITAL_COMMUNITY)
Admission: RE | Admit: 2011-06-21 | Discharge: 2011-06-21 | Disposition: A | Discharge status: Home / Self Care | Payer: Medicare Other | Source: Ambulatory Visit | Attending: Internal Medicine | Admitting: Internal Medicine

## 2011-06-21 DIAGNOSIS — L97309 Non-pressure chronic ulcer of unspecified ankle with unspecified severity: Secondary | ICD-10-CM | POA: Diagnosis not present

## 2011-06-21 DIAGNOSIS — IMO0001 Reserved for inherently not codable concepts without codable children: Secondary | ICD-10-CM | POA: Diagnosis not present

## 2011-06-21 NOTE — Progress Notes (Signed)
Physical Therapy - Wound Therapy  Treatment  Patient Details  Name: [name] MRN: [id number] Date of Birth: 05/09/1940  Today's Date: 06/21/2011 Time: 0805-0825 Time Calculation (min): 20 min  Visit#: 122  of 124   Re-eval: 06/28/11 Charge:  Self care x 8' Wound Therapy Wound 05/04/10 Abrasion(s);Other (Comment) Ankle Left;Lateral scratch that turned into ulcer; 2.0cmX1cmX0.25cm with 95% slough and 5% granulation (Active)  Site / Wound Assessment Granulation tissue 06/21/2011  8:30 AM  % Wound base Red or Granulating 100% 06/21/2011  8:30 AM  % Wound base Yellow 0% 06/21/2011  8:30 AM  % Wound base Black 0% 06/14/2011  8:37 AM  % Wound base Other (Comment) 0% 05/18/2011 10:53 AM  Peri-wound Assessment Intact 06/21/2011  8:30 AM  Wound Length (cm) 0.8 cm 06/21/2011  8:30 AM  Wound Width (cm) 0.2 cm 06/21/2011  8:30 AM  Wound Depth (cm) 0.05 cm 06/21/2011  8:30 AM  Tunneling (cm) none 06/14/2011  8:37 AM  Undermining (cm) none 06/14/2011  8:37 AM  Margins Attached edges (approximated) 06/14/2011  8:37 AM  Drainage Amount None 06/21/2011  8:30 AM  Drainage Description Serous 06/14/2011  8:37 AM  Non-staged Wound Description Partial thickness 06/21/2011  8:30 AM  Treatment Cleansed 06/21/2011  8:30 AM  Dressing Type Hydrogel 05/25/2011  9:00 AM  Dressing Changed Changed 06/21/2011  8:30 AM  Dressing Status Clean 06/21/2011  8:30 AM   Electrical Stimulation Electrical Stimulation - Pulse Rate: 70 J/cm2 x 2   Physical Therapy Assessment and Plan Wound Therapy - Assess/Plan/Recommendations Wound Therapy - Clinical Statement: Wound continues to improve. Hydrotherapy Plan: Dressing change Wound Therapy - Frequency:  (1x/wk-per pt wishes) PT Assessment and Plan Clinical Impression Statement: Pt swelling is gone. Pt continues to complete self-retromassage B.  Wound continues to decrease in size. Rehab Potential: Excellent PT Frequency: Min 1X/week PT Treatment/Interventions: Patient/family  education PT Plan: Reassess next treatment.    Goals    Problem List Patient Active Problem List  Diagnoses  . HYPERLIPIDEMIA  . HYPERTENSION  . ATRIAL FIBRILLATION  . ACUTE ON CHRONIC DIASTOLIC HEART FAILURE  . Occlusion and stenosis of carotid artery without mention of cerebral infarction  . SYNCOPE AND COLLAPSE  . Long term current use of anticoagulant  . Wound of ankle    GP No functional reporting required  Ajai Terhaar,[name] 06/21/2011, 8:35 AM

## 2011-06-22 ENCOUNTER — Ambulatory Visit (HOSPITAL_COMMUNITY): Payer: Medicare Other

## 2011-06-25 ENCOUNTER — Other Ambulatory Visit: Payer: Self-pay | Admitting: Orthopaedic Surgery

## 2011-06-25 DIAGNOSIS — M545 Low back pain: Secondary | ICD-10-CM

## 2011-06-28 ENCOUNTER — Ambulatory Visit (HOSPITAL_COMMUNITY)
Admission: RE | Admit: 2011-06-28 | Discharge: 2011-06-28 | Disposition: A | Discharge status: Home / Self Care | Payer: Medicare Other | Source: Ambulatory Visit | Attending: Internal Medicine | Admitting: Internal Medicine

## 2011-06-28 NOTE — Progress Notes (Signed)
Physical Therapy - Wound Therapy  Treatment  Patient Details  Name: [name] MRN: [id number] Date of Birth: 1941-03-06  Today's Date: 06/28/2011 Time: 0800-0810 Time Calculation (min): 10 min  Visit#: 122  of 124   Re-eval:  wound is healed no treatment needed pt discharged. Wound Therapy Wound 05/04/10 Abrasion(s);Other (Comment) Ankle Left;Lateral scratch that turned into ulcer; 2.0cmX1cmX0.25cm with 95% slough and 5% granulation (Active)  Site / Wound Assessment Granulation tissue 06/21/2011  8:30 AM  % Wound base Red or Granulating 100% 06/21/2011  8:30 AM  % Wound base Yellow 0% 06/21/2011  8:30 AM  % Wound base Black 0% 06/14/2011  8:37 AM  % Wound base Other (Comment) 0% 05/18/2011 10:53 AM  Peri-wound Assessment Intact 06/21/2011  8:30 AM  Wound Length (cm) 0.8 cm 06/21/2011  8:30 AM  Wound Width (cm) 0.2 cm 06/21/2011  8:30 AM  Wound Depth (cm) 0.05 cm 06/21/2011  8:30 AM  Tunneling (cm) none 06/14/2011  8:37 AM  Undermining (cm) none 06/14/2011  8:37 AM  Margins Attached edges (approximated) 06/14/2011  8:37 AM  Drainage Amount None 06/21/2011  8:30 AM  Drainage Description Serous 06/14/2011  8:37 AM  Non-staged Wound Description Partial thickness 06/21/2011  8:30 AM  Treatment Cleansed 06/21/2011  8:30 AM  Dressing Type Hydrogel 05/25/2011  9:00 AM  Dressing Changed Changed 06/21/2011  8:30 AM  Dressing Status Clean 06/21/2011  8:30 AM       Physical Therapy Assessment and Plan Wound Therapy - Assess/Plan/Recommendations Wound Therapy - Clinical Statement: Wound totally healed discharge patient      Goals PT Short Term Goals PT Short Term Goal 1 - Progress: Met PT Short Term Goal 2 - Progress: Met PT Short Term Goal 3 - Progress: Met PT Short Term Goal 4 - Progress: Met  Problem List Patient Active Problem List  Diagnoses  . HYPERLIPIDEMIA  . HYPERTENSION  . ATRIAL FIBRILLATION  . ACUTE ON CHRONIC DIASTOLIC HEART FAILURE  . Occlusion and stenosis of carotid artery without  mention of cerebral infarction  . SYNCOPE AND COLLAPSE  . Long term current use of anticoagulant  . Wound of ankle    GP No functional reporting required  Breckan Cafiero,[name] 06/28/2011, 8:23 AM

## 2011-06-29 ENCOUNTER — Encounter: Payer: Self-pay | Admitting: Surgery

## 2011-07-02 ENCOUNTER — Ambulatory Visit: Payer: Medicare Other | Admitting: Surgery

## 2011-07-02 ENCOUNTER — Other Ambulatory Visit (INDEPENDENT_AMBULATORY_CARE_PROVIDER_SITE_OTHER): Payer: Medicare Other | Admitting: *Deleted

## 2011-07-02 DIAGNOSIS — I6529 Occlusion and stenosis of unspecified carotid artery: Secondary | ICD-10-CM

## 2011-07-03 ENCOUNTER — Ambulatory Visit: Payer: Medicare Other | Admitting: *Deleted

## 2011-07-03 DIAGNOSIS — I4891 Unspecified atrial fibrillation: Secondary | ICD-10-CM

## 2011-07-03 NOTE — Patient Instructions (Signed)
INR is 1.1 today.  Pt to schedule a recheck 1 week after restarting coumadin.

## 2011-07-04 ENCOUNTER — Ambulatory Visit (INDEPENDENT_AMBULATORY_CARE_PROVIDER_SITE_OTHER): Payer: Medicare Other | Admitting: *Deleted

## 2011-07-04 ENCOUNTER — Ambulatory Visit
Admission: RE | Admit: 2011-07-04 | Discharge: 2011-07-04 | Disposition: A | Discharge status: Home / Self Care | Payer: Medicare Other | Source: Ambulatory Visit | Attending: Orthopaedic Surgery | Admitting: Orthopaedic Surgery

## 2011-07-04 ENCOUNTER — Other Ambulatory Visit: Payer: Self-pay | Admitting: Orthopaedic Surgery

## 2011-07-04 VITALS — BP 144/72 | HR 64

## 2011-07-04 DIAGNOSIS — M47817 Spondylosis without myelopathy or radiculopathy, lumbosacral region: Secondary | ICD-10-CM | POA: Diagnosis not present

## 2011-07-04 DIAGNOSIS — M545 Low back pain: Secondary | ICD-10-CM

## 2011-07-04 DIAGNOSIS — I4891 Unspecified atrial fibrillation: Secondary | ICD-10-CM

## 2011-07-04 DIAGNOSIS — Z7901 Long term (current) use of anticoagulants: Secondary | ICD-10-CM

## 2011-07-04 MED ORDER — DIAZEPAM 5 MG PO TABS
5.0000 mg | ORAL_TABLET | Freq: Once | ORAL | Status: AC
  Administered 2011-07-04: 5 mg via ORAL

## 2011-07-04 MED ORDER — IOHEXOL 180 MG/ML  SOLN
1.0000 mL | Freq: Once | INTRAMUSCULAR | Status: AC | PRN
  Administered 2011-07-04: 1 mL via EPIDURAL

## 2011-07-04 MED ORDER — METHYLPREDNISOLONE ACETATE 40 MG/ML INJ SUSP (RADIOLOG
120.0000 mg | Freq: Once | INTRAMUSCULAR | Status: AC
  Administered 2011-07-04: 120 mg via EPIDURAL

## 2011-07-04 NOTE — Discharge Instructions (Addendum)

## 2011-07-05 ENCOUNTER — Ambulatory Visit (HOSPITAL_COMMUNITY): Payer: Medicare Other | Admitting: *Deleted

## 2011-07-09 DIAGNOSIS — M545 Low back pain: Secondary | ICD-10-CM | POA: Diagnosis not present

## 2011-07-12 ENCOUNTER — Ambulatory Visit (HOSPITAL_COMMUNITY): Payer: Medicare Other

## 2011-07-16 ENCOUNTER — Other Ambulatory Visit: Payer: Self-pay | Admitting: *Deleted

## 2011-07-16 DIAGNOSIS — I6529 Occlusion and stenosis of unspecified carotid artery: Secondary | ICD-10-CM

## 2011-07-17 ENCOUNTER — Ambulatory Visit: Payer: Medicare Other | Admitting: *Deleted

## 2011-07-17 DIAGNOSIS — I4891 Unspecified atrial fibrillation: Secondary | ICD-10-CM

## 2011-07-17 NOTE — Progress Notes (Signed)
INR 1.1 TODAY.  WILL FORWARD INFORMATION TO LISA REID.  PT IS HAVING INJECTIONS AND NEEDS TO BE SUB THERAPUTIC FOR THESE.

## 2011-07-18 ENCOUNTER — Encounter: Payer: Self-pay | Admitting: Surgery

## 2011-07-18 ENCOUNTER — Ambulatory Visit
Admission: RE | Admit: 2011-07-18 | Discharge: 2011-07-18 | Disposition: A | Discharge status: Home / Self Care | Payer: Medicare Other | Source: Ambulatory Visit | Attending: Orthopaedic Surgery | Admitting: Orthopaedic Surgery

## 2011-07-18 VITALS — BP 143/71 | HR 59

## 2011-07-18 DIAGNOSIS — M545 Low back pain: Secondary | ICD-10-CM

## 2011-07-18 DIAGNOSIS — M47817 Spondylosis without myelopathy or radiculopathy, lumbosacral region: Secondary | ICD-10-CM | POA: Diagnosis not present

## 2011-07-18 MED ORDER — IOHEXOL 180 MG/ML  SOLN
1.0000 mL | Freq: Once | INTRAMUSCULAR | Status: AC | PRN
  Administered 2011-07-18: 1 mL via EPIDURAL

## 2011-07-18 MED ORDER — METHYLPREDNISOLONE ACETATE 40 MG/ML INJ SUSP (RADIOLOG
120.0000 mg | Freq: Once | INTRAMUSCULAR | Status: AC
  Administered 2011-07-18: 120 mg via EPIDURAL

## 2011-07-18 MED ORDER — DIAZEPAM 5 MG PO TABS
5.0000 mg | ORAL_TABLET | Freq: Once | ORAL | Status: AC
  Administered 2011-07-18: 5 mg via ORAL

## 2011-07-18 NOTE — Discharge Instructions (Signed)
Post Procedure Spinal Discharge Instruction Sheet  1. You may resume a regular diet and any medications that you routinely take (including pain medications).  2. No driving day of procedure.  3. Light activity throughout the rest of the day.  Do not do any strenuous work, exercise, bending or lifting.  The day following the procedure, you can resume normal physical activity but you should refrain from exercising or physical therapy for at least three days thereafter.   Common Side Effects:   Headaches- take your usual medications as directed by your physician.  Increase your fluid intake.  Caffeinated beverages may be helpful.  Lie flat in bed until your headache resolves.   Restlessness or inability to sleep- you may have trouble sleeping for the next few days.  Ask your referring physician if you need any medication for sleep.   Facial flushing or redness- should subside within a few days.   Increased pain- a temporary increase in pain a day or two following your procedure is not unusual.  Take your pain medication as prescribed by your referring physician.   Leg cramps  Please contact our office at [phone] for the following symptoms:  Fever greater than 100 degrees.  Headaches unresolved with medication after 2-3 days.  Increased swelling, pain, or redness at injection site.  Thank you for visiting our office.   May resume plavix and coumadin today.

## 2011-07-18 NOTE — Procedures (Unsigned)
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 Diabetes:  No Cardiac:  No Hypertension:  Yes Smoking:  Previous Previous Surgery:  Mastectomy on the left CV History:  CVA 2008, the patient complains of lightheadedness Amaurosis Fugax Yes No, Paresthesias Yes No, Hemiparesis Yes No                                      RIGHT             LEFT Brachial systolic pressure:                           Mastectomy Brachial Doppler waveforms:         Monophasic        Triphasic Vertebral direction of flow:        Retrograde        Antegrade DUPLEX VELOCITIES (cm/sec) CCA peak systolic                   35                168 ECA peak systolic                   33                138 slight curve ICA peak systolic                   31                252 ICA end diastolic                   4                 49 PLAQUE MORPHOLOGY:                  Mixed, irregular  Mixed PLAQUE AMOUNT:                      Mild              Mild PLAQUE LOCATION:                    Bifurcation, ICA  Bifurcation, ICA, ECA  IMPRESSION: 1. Right:  Known innominate artery occlusion. 2. Patent right common carotid artery, internal carotid artery and     external carotid artery with abnormal dampened waveforms. 3. Retrograde right vertebral artery. 4. Left:  60%-79% internal carotid artery stenosis.  Increased     velocity may be due to contralateral occlusion of the innominate. 5. Antegrade left vertebral artery flow. 6. No change since previous study of 12/25/2010.  ___________________________________________ V. Charlena Cross, MD  SS/MEDQ  D:  07/02/2011  T:  07/02/2011  Job:  [id number]

## 2011-07-19 ENCOUNTER — Ambulatory Visit (HOSPITAL_COMMUNITY): Payer: Medicare Other | Admitting: Physical Therapy

## 2011-07-19 ENCOUNTER — Other Ambulatory Visit: Payer: Self-pay

## 2011-07-19 DIAGNOSIS — I6529 Occlusion and stenosis of unspecified carotid artery: Secondary | ICD-10-CM

## 2011-07-26 ENCOUNTER — Ambulatory Visit (HOSPITAL_COMMUNITY): Payer: Medicare Other | Admitting: Physical Therapy

## 2011-08-01 ENCOUNTER — Emergency Department (HOSPITAL_COMMUNITY)
Admission: EM | Admit: 2011-08-01 | Discharge: 2011-08-01 | Disposition: A | Discharge status: Home / Self Care | Payer: Medicare Other | Attending: Emergency Medicine | Admitting: Emergency Medicine

## 2011-08-01 ENCOUNTER — Encounter (HOSPITAL_COMMUNITY): Payer: Self-pay | Admitting: Emergency Medicine

## 2011-08-01 DIAGNOSIS — D689 Coagulation defect, unspecified: Secondary | ICD-10-CM | POA: Diagnosis not present

## 2011-08-01 DIAGNOSIS — Z7901 Long term (current) use of anticoagulants: Secondary | ICD-10-CM | POA: Diagnosis not present

## 2011-08-01 DIAGNOSIS — Z853 Personal history of malignant neoplasm of breast: Secondary | ICD-10-CM | POA: Insufficient documentation

## 2011-08-01 DIAGNOSIS — E785 Hyperlipidemia, unspecified: Secondary | ICD-10-CM | POA: Diagnosis not present

## 2011-08-01 DIAGNOSIS — Z79899 Other long term (current) drug therapy: Secondary | ICD-10-CM | POA: Diagnosis not present

## 2011-08-01 DIAGNOSIS — S61409A Unspecified open wound of unspecified hand, initial encounter: Secondary | ICD-10-CM | POA: Diagnosis not present

## 2011-08-01 DIAGNOSIS — X58XXXA Exposure to other specified factors, initial encounter: Secondary | ICD-10-CM | POA: Insufficient documentation

## 2011-08-01 DIAGNOSIS — IMO0002 Reserved for concepts with insufficient information to code with codable children: Secondary | ICD-10-CM

## 2011-08-01 DIAGNOSIS — Z8673 Personal history of transient ischemic attack (TIA), and cerebral infarction without residual deficits: Secondary | ICD-10-CM | POA: Insufficient documentation

## 2011-08-01 DIAGNOSIS — I10 Essential (primary) hypertension: Secondary | ICD-10-CM | POA: Insufficient documentation

## 2011-08-01 DIAGNOSIS — M810 Age-related osteoporosis without current pathological fracture: Secondary | ICD-10-CM | POA: Insufficient documentation

## 2011-08-01 LAB — POCT I-STAT, CHEM 8
BUN: 30 mg/dL — ABNORMAL HIGH (ref 6–23)
Creatinine, Ser: 1.3 mg/dL — ABNORMAL HIGH (ref 0.50–1.10)
Hemoglobin: 12.9 g/dL (ref 12.0–15.0)
Potassium: 4.3 mEq/L (ref 3.5–5.1)
Sodium: 139 mEq/L (ref 135–145)

## 2011-08-01 MED ORDER — TETANUS-DIPHTH-ACELL PERTUSSIS 5-2.5-18.5 LF-MCG/0.5 IM SUSP
0.5000 mL | Freq: Once | INTRAMUSCULAR | Status: AC
  Administered 2011-08-01: 0.5 mL via INTRAMUSCULAR
  Filled 2011-08-01: qty 0.5

## 2011-08-01 MED ORDER — "THROMBI-PAD 3""X3"" EX PADS"
1.0000 | MEDICATED_PAD | Freq: Once | CUTANEOUS | Status: AC
  Administered 2011-08-01: 1 via TOPICAL

## 2011-08-01 NOTE — ED Provider Notes (Signed)
History   This chart was scribed for Glynn Octave, MD by Melba Coon. The patient was seen in room [facility]19[facility] and the patient's care was started at 9:24PM.    CSN: [id number]  Arrival date & time 08/01/11  2106   First MD Initiated Contact with Patient 08/01/11 2121      Chief Complaint  Patient presents with  . Coagulation Disorder    (Consider location/radiation/quality/duration/timing/severity/associated sxs/prior treatment) HPI [name] is a 71 y.o. female who presents to the Emergency Department complaining of a present skin tear and persistent moderate right hand bleeding with an onset 6:00PM. Pt recently had 2 spots where the skin was torn on her right hand. Pt had spots covered with Band-Aids today; when pt removed Band-Aids, the skin was further torn. Pt has been actively bleeding since. Bleeding is so profuse that bandages have had to be changed 4 times since onset. No generalized pain present. No HA, fever, neck pain, sore throat, rash, back pain, CP, SOB, abd pain, n/v/d, dysuria, or extremity pain, edema, weakness, numbness, or tingling. Pt takes Coumadin for A fib. Allergic to iodine and iohexol. No other pertinent medical symptoms.  PCP: Dr. Catalina Pizza  Past Medical History  Diagnosis Date  . Subclavian steal syndrome   . Carotid bruit     LICA 60-79% (duplex 9/11)  . Hypertension   . Hyperlipidemia   . Atrial fibrillation   . Anticoagulation management encounter   . Meningioma   . Osteoporosis   . Stroke   . Pneumonia   . Breast cancer     S/P left mastectomy    Past Surgical History  Procedure Date  . Cardiac catheterization   . Mastectomy     left in 1990  . Cataract surgery     recently  . Colonoscopy 11/17/2010    Procedure: COLONOSCOPY;  Surgeon: Malissa Hippo, MD;  Location: [facility];  Service: Endoscopy;  Laterality: N/A;  10:45 am  . Esophagogastroduodenoscopy 11/17/2010    Procedure: ESOPHAGOGASTRODUODENOSCOPY (EGD);  Surgeon:  Malissa Hippo, MD;  Location: [facility];  Service: Endoscopy;  Laterality: N/A;  . Mastectomy     Family History  Problem Relation Age of Onset  . Alzheimer's disease Mother   . Dementia Mother   . Parkinsonism Father     History  Substance Use Topics  . Smoking status: Former Smoker    Types: Cigarettes  . Smokeless tobacco: Not on file   Comment: x 4 yrs.  . Alcohol Use: Yes     occasional alcohol    OB History    Grav Para Term Preterm Abortions TAB SAB Ect Mult Living                  Review of Systems 10 Systems reviewed and all are negative for acute change except as noted in the HPI.   Allergies  Iodine; Iohexol; and Tape  Home Medications   Current Outpatient Rx  Name Route Sig Dispense Refill  . ALENDRONATE SODIUM 70 MG PO TABS Oral Take 70 mg by mouth every 7 (seven) days. Take in the morning with a full glass of water, on an empty stomach, and do not take anything else by mouth or lie down for the next 30 min. Patient take on Sunday    . AMIODARONE HCL 200 MG PO TABS Oral Take 1 tablet (200 mg total) by mouth 2 (two) times daily. 180 tablet 2  . AMLODIPINE BESYLATE-VALSARTAN 5-160 MG  PO TABS Oral Take 1 tablet by mouth daily. 90 tablet 3  . ASCORBIC ACID 500 MG PO TABS Oral Take 500 mg by mouth every morning.      . ASPIRIN EC 81 MG PO TBEC Oral Take 81 mg by mouth daily.      Marland Kitchen CALCIUM-VITAMIN D 250-125 MG-UNIT PO TABS Oral Take 1 tablet by mouth 2 (two) times daily.      Marland Kitchen CLOPIDOGREL BISULFATE 75 MG PO TABS Oral Take 1 tablet (75 mg total) by mouth every morning. 90 tablet 3  . COLESEVELAM HCL 625 MG PO TABS Oral Take 3 tablets (1,875 mg total) by mouth 3 (three) times daily. [id number] tablet 2  . FERROUS SULFATE 325 (65 FE) MG PO TBEC Oral Take 1 tablet (325 mg total) by mouth 2 (two) times daily.    . OMEGA-3 FATTY ACIDS 1000 MG PO CAPS Oral Take 1 g by mouth daily.     Marland Kitchen HYDROCODONE-ACETAMINOPHEN 5-325 MG PO TABS Oral Take 1 tablet by mouth every 6  (six) hours as needed. pain    . MELOXICAM 15 MG PO TABS Oral Take 15 mg by mouth every morning.      Marland Kitchen METOPROLOL TARTRATE 25 MG PO TABS Oral Take 3 tablets (75 mg total) by mouth 2 (two) times daily. 90 tablet 3  . MULTIVITAMINS PO CAPS Oral Take 1 capsule by mouth daily.      Marland Kitchen PRAVASTATIN SODIUM 40 MG PO TABS Oral Take 1 tablet (40 mg total) by mouth at bedtime. 90 tablet 3  . WARFARIN SODIUM 4 MG PO TABS Oral Take 4 mg by mouth See admin instructions. Take one tablet (4mg ) by mouth on Sundays and Thursdays. Take one half tablet (2mg ) on all other days      BP 176/72  Pulse 76  Temp(Src) 97.9 F (36.6 C) (Oral)  Resp 16  Ht 5\' 5"  (1.651 m)  Wt 121 lb (54.885 kg)  BMI 20.14 kg/m2  SpO2 97%  Physical Exam  Nursing note and vitals reviewed. Constitutional: She is oriented to person, place, and time. She appears well-developed and well-nourished.       Awake, alert, nontoxic appearance.  HENT:  Head: Normocephalic and atraumatic.  Eyes: EOM are normal. Pupils are equal, round, and reactive to light. Right eye exhibits no discharge. Left eye exhibits no discharge.  Neck: Normal range of motion. Neck supple.  Cardiovascular: Normal rate, regular rhythm and normal heart sounds.   No murmur heard. Pulmonary/Chest: Effort normal and breath sounds normal. She exhibits no tenderness.  Abdominal: Soft. There is no tenderness. There is no rebound.  Musculoskeletal: She exhibits no edema and no tenderness.       Baseline ROM, no obvious new focal weakness. Full ROM of right hand and nml stength and sensation.  Neurological: She is alert and oriented to person, place, and time.       Mental status and motor strength appears baseline for patient and situation.  Skin: Skin is warm. No rash noted.       Dorsal rt hand: 1st skin tear - 2 cm skin tear with partial thickness overlying 2nd metacarpal; 2nd skin tear - overlying 5th metacarpal with partial thickness; both tears have active oozing  blood  Psychiatric: She has a normal mood and affect. Her behavior is normal.    ED Course  Procedures (including critical care time)  DIAGNOSTIC STUDIES: Oxygen Saturation is 100% on room air, normal by my interpretation.  COORDINATION OF CARE:  9:29PM - EDMD will close the affected area, as well as order tetanus shot for the pt. 9:55PM - recheck; pt is doing fine, but bleeding still unchanged 10:12PM - recheck; pt is doing fine, but bleeding still unchanged   Labs Reviewed  PROTIME-INR - Abnormal; Notable for the following:    Prothrombin Time 32.3 (*)    INR 3.08 (*)    All other components within normal limits  POCT I-STAT, CHEM 8 - Abnormal; Notable for the following:    BUN 30 (*)    Creatinine, Ser 1.30 (*)    All other components within normal limits  LAB REPORT - SCANNED   No results found.   1. Skin tear   2. Warfarin anticoagulation       MDM  Persistent bleeding skin tear on Coumadin. Vitals stable.  Hand neurovascularly intact.    Thrombigel applied.  Persistent oozing.  Hemoglobin stable, INR 3  Surgicel applied with cessation of bleeding.  Pressure dressing applied. Wound care instructions given to patient and husband.       I personally performed the services described in this documentation, which was scribed in my presence.  The recorded information has been reviewed and considered.       Glynn Octave, MD 08/02/11 [phone]

## 2011-08-01 NOTE — Discharge Instructions (Signed)
Skin Tear Laceration Care Do not remove dressing until Friday morning.  Soak it off gently.  Follow up with Dr. Margo Aye this week.  Return to the ED if you develop new or worsening symptoms. A skin tear is when the top layer of skin has been peeled off. This is a common problem with aging as the skin becomes thinner and more fragile. This is often repaired with tape or skin adhesive strips. This keeps the skin that has been peeled off in contact with the healthier skin beneath. Sometimes the peeled skin will grow back on but often it turns black and dies. Even when this happens, the torn skin acts as a good dressing until the skin underneath gets healthier and repairs itself. HOME CARE INSTRUCTIONS   Only take over-the-counter or prescription medicines for pain, discomfort or fever as directed by your caregiver.   A dressing, depending on the location of the laceration, may have been applied over the tape or skin adhesive strips. This may be changed once per day or as instructed. If the dressing sticks, it may be soaked off with warm soapy water.  You might need a tetanus shot now if:  You have no idea when you had the last one.   You have never had a tetanus shot before.   Your wound had dirt in it.  If you need a tetanus shot, and you choose not to get one, there is a rare chance of getting tetanus. Sickness from tetanus can be serious. If you got a tetanus shot, your arm may swell, get red and warm to the touch at the shot site. This is common and not a problem. SEEK IMMEDIATE MEDICAL CARE IF:   There is redness, swelling or increasing pain in the wound.   Pus is coming from wound.   An unexplained oral temperature above 102 F (38.9 C) develops.   You notice a bad smell coming from the wound or dressing.  Document Released: 12/19/2000 Document Revised: 03/15/2011 Document Reviewed: 06/28/2008 [facility] Patient Information 2012 [place], [place].

## 2011-08-01 NOTE — ED Notes (Signed)
Pt on blood thinners and pulled bandaid off and now has skin tear

## 2011-08-02 ENCOUNTER — Ambulatory Visit: Payer: Self-pay | Admitting: *Deleted

## 2011-08-02 ENCOUNTER — Telehealth: Payer: Self-pay | Admitting: *Deleted

## 2011-08-02 ENCOUNTER — Ambulatory Visit (HOSPITAL_COMMUNITY): Payer: Medicare Other | Admitting: Physical Therapy

## 2011-08-02 DIAGNOSIS — I4891 Unspecified atrial fibrillation: Secondary | ICD-10-CM

## 2011-08-02 DIAGNOSIS — Z7901 Long term (current) use of anticoagulants: Secondary | ICD-10-CM

## 2011-08-02 NOTE — Telephone Encounter (Signed)
Pt called.  See coumadin note.  Does not need INR appt today.   Coumadin instructions given.

## 2011-08-02 NOTE — Telephone Encounter (Signed)
Patient Was at ED last night and Protime was checked there it was 3.0.  Patient wants to know if you could look at that lab and call with instructions instead of her coming for appointment that was scheduled for today. / tg

## 2011-08-03 ENCOUNTER — Ambulatory Visit (HOSPITAL_COMMUNITY)
Admission: RE | Admit: 2011-08-03 | Discharge: 2011-08-03 | Disposition: A | Discharge status: Home / Self Care | Payer: Medicare Other | Source: Ambulatory Visit | Attending: Internal Medicine | Admitting: Internal Medicine

## 2011-08-03 DIAGNOSIS — IMO0001 Reserved for inherently not codable concepts without codable children: Secondary | ICD-10-CM | POA: Diagnosis not present

## 2011-08-03 DIAGNOSIS — L97309 Non-pressure chronic ulcer of unspecified ankle with unspecified severity: Secondary | ICD-10-CM | POA: Insufficient documentation

## 2011-08-03 NOTE — Progress Notes (Signed)
Physical Therapy - Wound Therapy  Evaluation  Patient Details  Name: [name] MRN: [id number] Date of Birth: [birth year]  Today's Date: 08/03/2011 Time: 1345-1430 Time Calculation (min): 45 min  Visit#: 1  of 8   Re-eval: 09/02/11  Wound Therapy                                                                                  Wound 08/03/11 Skin tear Hand MCP at index finger  (Active)  Site / Wound Assessment Dusky;Painful 08/03/2011  5:06 PM  % Wound base Red or Granulating 0% 08/03/2011  5:06 PM  % Wound base Yellow 100% 08/03/2011  5:06 PM  Peri-wound Assessment Intact 08/03/2011  5:06 PM  Wound Length (cm) 3 cm 08/03/2011  5:06 PM  Wound Width (cm) 2.5 cm 08/03/2011  5:06 PM  Wound Depth (cm) 0 cm 08/03/2011  5:06 PM  Margins Unattacted edges (unapproximated) 08/03/2011  5:06 PM  Closure None 08/03/2011  5:06 PM  Drainage Amount Minimal 08/03/2011  5:06 PM  Drainage Description Sanguineous 08/03/2011  5:06 PM  Treatment Cleansed 08/03/2011  5:06 PM  Dressing Type Hydrogel 08/03/2011  5:06 PM  Dressing Changed Changed 08/03/2011  5:06 PM  Dressing Status Clean 08/03/2011  5:06 PM     Wound 08/03/11 Skin tear Hand Right mcp at little finger (Active)  Site / Wound Assessment Bleeding 08/03/2011  5:06 PM  % Wound base Red or Granulating 50% 08/03/2011  5:06 PM  % Wound base Yellow 50% 08/03/2011  5:06 PM  Peri-wound Assessment Intact 08/03/2011  5:06 PM  Wound Length (cm) 3 cm 08/03/2011  5:06 PM  Wound Width (cm) 1.25 cm 08/03/2011  5:06 PM  Margins Unattacted edges (unapproximated) 08/03/2011  5:06 PM  Drainage Amount Minimal 08/03/2011  5:06 PM  Drainage Description Serosanguineous 08/03/2011  5:06 PM  Treatment Cleansed 08/03/2011  5:06 PM  Dressing Type Hydrogel 08/03/2011  5:06 PM  Dressing Changed Changed 08/03/2011  5:06 PM   Selective Debridement Selective Debridement - Location: Pt terrified at the thought of debridement.  Wound may need pulse lavage next  treatment.  I do not believe that the patient will tolerate debridement.   Physical Therapy Assessment and Plan Wound Therapy - Assess/Plan/Recommendations Wound Therapy - Clinical Statement: Pt to be seen 2 times a week for 4 weeks or until wounds are healed Factors Delaying/Impairing Wound Healing:  (Pt has had history of wounds becoming infected and not heali) Hydrotherapy Plan: Pulsatile lavage with suction;Dressing change;Patient/family education Wound Therapy - Frequency:  (2x/wk) Wound Therapy - Current Recommendations: PT      Goals:   STG 2wk 1) Pt to be able to verbalize the importance of not placing tape on skin as pt will always be at risk of skin tears. 2)  Wound bed to be 50% epithealized  LTG 4wk 1) Pt wounds to be decreased by 1.5x.75 cm 2) Pt to only have scant drainage   Problem List Patient Active Problem List  Diagnoses  . HYPERLIPIDEMIA  . HYPERTENSION  . ATRIAL FIBRILLATION  . ACUTE ON CHRONIC DIASTOLIC HEART FAILURE  . Occlusion and stenosis of carotid artery without mention of cerebral infarction  .  SYNCOPE AND COLLAPSE  . Long term current use of anticoagulant  . Wound of ankle    GP Current Status  [phone] - Other PT/OT Primary CN - 100% imparied, limited or restricted  Goal                      G8990                                                                                                    CI I picked CN because the wound is covered with slough with no granulation visible. CI because at D/C I anticipate only 5% slough.  Tylasia [name],Colleen 08/03/2011, 5:26 PM

## 2011-08-07 ENCOUNTER — Ambulatory Visit (HOSPITAL_COMMUNITY)
Admission: RE | Admit: 2011-08-07 | Discharge: 2011-08-07 | Disposition: A | Discharge status: Home / Self Care | Payer: Medicare Other | Source: Ambulatory Visit | Attending: Internal Medicine | Admitting: Internal Medicine

## 2011-08-07 NOTE — Progress Notes (Addendum)
Physical Therapy - Wound Therapy  Treatment  Patient Details  Name: [name] MRN: [id number] Date of Birth: 1940-09-19  Today's Date: 08/07/2011 Time: 1150-1215 Time Calculation (min): 25 min  Visit#: 2  of 8   Re-eval: 09/02/11  Wound Therapy  08/07/11 1248  Subjective Assessment  Subjective Pt very nervous through session,  entered dept with dressings on hands reported no pain.  Hand bleeding with dressings off.  Wound 08/03/11 Skin tear Hand MCP at index finger   Date First Assessed/Time First Assessed: 08/03/11 1445   Wound Type: Skin tear  Location: Hand  Wound Description (Comments): MCP at index finger   Present on Admission: Yes  Site / Wound Assessment Dusky;Painful;Yellow;Pink  % Wound base Red or Granulating 10%  % Wound base Yellow 90%  % Wound base Black 0%  Margins Unattacted edges (unapproximated)  Closure None  Drainage Amount Minimal  Drainage Description Sanguineous  Treatment Cleansed;Hydrotherapy (Pulse lavage);Other (Comment)  Dressing Type Hydrogel  Dressing Changed Changed (vaseline periwound)  Dressing Status Clean  Wound 08/03/11 Skin tear Hand Right mcp at little finger  Date First Assessed: 08/03/11   Wound Type: Skin tear  Location: (c) Hand  Location Orientation: Right  Wound Description (Comments): mcp at little finger  Site / Wound Assessment Bleeding  % Wound base Red or Granulating 70%  % Wound base Yellow 30%  % Wound base Black 0%  Margins Unattacted edges (unapproximated)  Drainage Amount Minimal  Drainage Description Serosanguineous  Treatment Cleansed;Hydrotherapy (Pulse lavage)  Dressing Type Hydrogel  Dressing Changed Changed (vaseline periwound)  Hydrotherapy  Pulsed Lavage with Suction (psi) 200 psi  Pulsed Lavage with Suction - Normal Saline Used 1000 mL  Pulsed Lavage Tip Tip with splash shield  Pulsed lavage therapy - wound location R hand  Selective Debridement  Selective Debridement - Location no debridement just  cleansed, PLS and dressings today  Wound Therapy - Assess/Plan/Recommendations  Wound Therapy - Clinical Statement Pt very nervous through session, fearful of debridement even after infromed there would be no debridement complete this session.  Able to remove 10% yellow slough with PLS.  Vaseline placed periwound with hydrogel and 4x4 gauze dressings.    Hydrotherapy Plan Pulsatile lavage with suction;Dressing change     Hydrotherapy Pulsed lavage therapy - wound location: R hand Pulsed Lavage with Suction (psi): 200 psi Pulsed Lavage with Suction - Normal Saline Used: 1000 mL Pulsed Lavage Tip: Tip with splash shield Selective Debridement Selective Debridement - Location: no debridement just cleansed, PLS and dressings today   Physical Therapy Assessment and Plan Wound Therapy - Assess/Plan/Recommendations Wound Therapy - Clinical Statement: Pt very nervous through session, fearful of debridement even after infromed there would be no debridement complete this session.  Able to remove 10% yellow slough with PLS.  Vaseline placed periwound with hydrogel and 4x4 gauze dressings.  Pt returned to department later that afternoon with blood through dressings, wound was redressed with increased pressure and abd pad placed to address increased drainage. Hydrotherapy Plan: Pulsatile lavage with suction;Dressing change      Goals    Problem List Patient Active Problem List  Diagnoses  . HYPERLIPIDEMIA  . HYPERTENSION  . ATRIAL FIBRILLATION  . ACUTE ON CHRONIC DIASTOLIC HEART FAILURE  . Occlusion and stenosis of carotid artery without mention of cerebral infarction  . SYNCOPE AND COLLAPSE  . Long term current use of anticoagulant  . Wound of ankle    GP No functional reporting required  [name],  PTA 08/07/2011, 12:58 PM

## 2011-08-09 ENCOUNTER — Ambulatory Visit (HOSPITAL_COMMUNITY)
Admission: RE | Admit: 2011-08-09 | Discharge: 2011-08-09 | Disposition: A | Discharge status: Home / Self Care | Payer: Medicare Other | Source: Ambulatory Visit | Attending: Internal Medicine | Admitting: Internal Medicine

## 2011-08-09 DIAGNOSIS — L97309 Non-pressure chronic ulcer of unspecified ankle with unspecified severity: Secondary | ICD-10-CM | POA: Diagnosis not present

## 2011-08-09 DIAGNOSIS — IMO0001 Reserved for inherently not codable concepts without codable children: Secondary | ICD-10-CM | POA: Insufficient documentation

## 2011-08-09 NOTE — Progress Notes (Signed)
Physical Therapy - Wound Therapy  Treatment  Patient Details  Name: [name] MRN: [id number] Date of Birth: September 21, 1940  Today's Date: 08/09/2011 Time: 0810-0850 Time Calculation (min): 40 min Visit#: 3  of 8   Re-eval: 09/02/11 Charges: Selective debridement (= or < 20 cm)   Wound Therapy  08/09/11 0900  Wound 08/03/11 Skin tear Hand MCP at index finger   Date First Assessed/Time First Assessed: 08/03/11 1445   Wound Type: Skin tear  Location: Hand  Wound Description (Comments): MCP at index finger   Present on Admission: Yes  Site / Wound Assessment Dusky;Painful;Yellow;Pink  % Wound base Red or Granulating 10%  % Wound base Yellow 90%  % Wound base Black 0%  Margins Unattacted edges (unapproximated)  Closure None  Drainage Amount Minimal  Drainage Description Sanguineous  Treatment Cleansed (PL not completed to this wound secondary to bleeding)  Dressing Type Hydrogel  Dressing Changed Changed (vaseline periwound)  Dressing Status Clean  Wound 08/03/11 Skin tear Hand Right mcp at little finger  Date First Assessed: 08/03/11   Wound Type: Skin tear  Location: (c) Hand  Location Orientation: Right  Wound Description (Comments): mcp at little finger  Site / Wound Assessment Bleeding  % Wound base Red or Granulating 70%  % Wound base Yellow 30%  % Wound base Black 0%  Margins Unattacted edges (unapproximated)  Drainage Amount Minimal  Drainage Description Serosanguineous  Treatment Cleansed;Hydrotherapy (Pulse lavage) (with decreased suction to limit bleeding)  Dressing Type Hydrogel  Dressing Changed New (vaseline periwound)  Hydrotherapy  Pulsed lavage therapy - wound location R hand  Pulsed Lavage with Suction (psi) 50 psi (mmHg)  Pulsed Lavage with Suction - Normal Saline Used 1000 mL  Pulsed Lavage Tip Tip with splash shield  Wound Therapy - Assess/Plan/Recommendations  Wound Therapy - Clinical Statement Pt is apprehensive throughout session but anxiety  appears decreased this session. PL completed to wound on ulnar side of R hand with decreased suction secondary to bleeding. No PL completed to wound on radial side of R hand secondary to bleeding. Abd pad cut to fit wound on radial side secondary to bleeding. Pt with multiple skin tears on L hand dressed with hydrogel and covered with 2x2. Vaseline applied to periwound on B hands. Both hands wrapped with 3" soft form gauze and # 5 netting. Pt reports 0/10 pain at end of session.  Hydrotherapy Plan Pulsatile lavage with suction;Dressing change   Hydrotherapy Pulsed lavage therapy - wound location: R hand Pulsed Lavage with Suction (psi): 50 psi (mmHg) Pulsed Lavage with Suction - Normal Saline Used: 1000 mL Pulsed Lavage Tip: Tip with splash shield   Physical Therapy Assessment and Plan Wound Therapy - Assess/Plan/Recommendations Wound Therapy - Clinical Statement: Pt is apprehensive throughout session but anxiety appears decreased this session. PL completed to wound on ulnar side of R hand with decreased suction secondary to bleeding. No PL completed to wound on radial side of R hand secondary to bleeding. Abd pad cut to fit wound on radial side secondary to bleeding. Pt with multiple skin tears on L hand dressed with hydrogel and covered with 2x2. Vaseline applied to periwound on B hands. Both hands wrapped with 3" soft form gauze and # 5 netting. Pt reports 0/10 pain at end of session. Hydrotherapy Plan: Pulsatile lavage with suction;Dressing change PT Assessment and Plan PT Plan: Continue to progress per PT POC.     Problem List Patient Active Problem List  Diagnoses  . HYPERLIPIDEMIA  .  HYPERTENSION  . ATRIAL FIBRILLATION  . ACUTE ON CHRONIC DIASTOLIC HEART FAILURE  . Occlusion and stenosis of carotid artery without mention of cerebral infarction  . SYNCOPE AND COLLAPSE  . Long term current use of anticoagulant  . Wound of ankle     Seth Bake, PTA 08/09/2011, 9:46 AM

## 2011-08-14 ENCOUNTER — Ambulatory Visit (HOSPITAL_COMMUNITY)
Admission: RE | Admit: 2011-08-14 | Discharge: 2011-08-14 | Disposition: A | Discharge status: Home / Self Care | Payer: Medicare Other | Source: Ambulatory Visit | Attending: Internal Medicine | Admitting: Internal Medicine

## 2011-08-14 DIAGNOSIS — IMO0001 Reserved for inherently not codable concepts without codable children: Secondary | ICD-10-CM | POA: Diagnosis not present

## 2011-08-14 DIAGNOSIS — L97309 Non-pressure chronic ulcer of unspecified ankle with unspecified severity: Secondary | ICD-10-CM | POA: Diagnosis not present

## 2011-08-14 NOTE — Progress Notes (Signed)
Physical Therapy Wound Treatment Patient Details  Name: [name] MRN: [id number] Date of Birth: 08-13-1940  Today's Date: 08/14/2011 Time: 9147-8295 PT Time Calculation (min): 35 min Visit#: 4  of 8   Re-eval: 08/31/11 Charges:  Reece Levy < 20cm   Subjective: Symptoms/Limitations Symptoms: Husband states the hydrogel makes it worse and has not been using it.  Pt. reports she's been putting a glove on her hand to cook/clean. Pain Assessment Currently in Pain?: No/denies   Woundcare:   08/14/11 0900  Wound 08/03/11 Skin tear Hand MCP at index finger   Date First Assessed/Time First Assessed: 08/03/11 1445   Wound Type: Skin tear  Location: Hand  Wound Description (Comments): MCP at index finger   Present on Admission: Yes  Site / Wound Assessment Dusky;Painful;Yellow;Pink  % Wound base Red or Granulating 15%  % Wound base Yellow 85%  % Wound base Black 0%  Margins Unattacted edges (unapproximated)  Closure None  Drainage Amount Minimal  Drainage Description Serosanguineous  Treatment Cleansed;Debridement (Selective)  Dressing Type Impregnated gauze (bismuth)  Dressing Changed New  Dressing Status Clean;Intact;Dry  Wound 08/03/11 Skin tear Hand Right mcp at little finger  Date First Assessed: 08/03/11   Wound Type: Skin tear  Location: (c) Hand  Location Orientation: Right  Wound Description (Comments): mcp at little finger  Site / Wound Assessment Pale;Yellow;Painful;Granulation tissue  % Wound base Red or Granulating 75%  % Wound base Yellow 25%  % Wound base Black 0%  Margins Unattacted edges (unapproximated)  Drainage Amount Minimal  Drainage Description Serosanguineous  Treatment Cleansed;Debridement (Selective)  Dressing Type Impregnated gauze (bismuth)  Dressing Changed New  Dressing Status Dry;Intact;Clean     Physical Therapy Assessment and Plan PT Assessment and Plan Clinical Impression Statement: Very little bleeding today when dressings removed.  Increasing  granulation and able to debride edges with forceps.  Held pulse lavage to prevent return of bleeding and irrigated with keracleanse.  Changed dressing to xeroform to promote moisture/granulation without adherence to wound.   Pt. Given xeroform to change dressings as home as needed. PT Plan: Continue current wound care.     Problem List Patient Active Problem List  Diagnoses  . HYPERLIPIDEMIA  . HYPERTENSION  . ATRIAL FIBRILLATION  . ACUTE ON CHRONIC DIASTOLIC HEART FAILURE  . Occlusion and stenosis of carotid artery without mention of cerebral infarction  . SYNCOPE AND COLLAPSE  . Long term current use of anticoagulant  . Wound of ankle    PT - End of Session Activity Tolerance: Patient tolerated treatment well General Behavior During Session: [facility] for tasks performed Cognition: [facility] for tasks performed   Gerald Honea B. Bascom Levels, PTA 08/14/2011, 9:41 AM

## 2011-08-15 DIAGNOSIS — I259 Chronic ischemic heart disease, unspecified: Secondary | ICD-10-CM | POA: Diagnosis not present

## 2011-08-15 DIAGNOSIS — I10 Essential (primary) hypertension: Secondary | ICD-10-CM | POA: Diagnosis not present

## 2011-08-15 DIAGNOSIS — I4891 Unspecified atrial fibrillation: Secondary | ICD-10-CM | POA: Diagnosis not present

## 2011-08-15 DIAGNOSIS — E785 Hyperlipidemia, unspecified: Secondary | ICD-10-CM | POA: Diagnosis not present

## 2011-08-15 DIAGNOSIS — T148XXA Other injury of unspecified body region, initial encounter: Secondary | ICD-10-CM | POA: Diagnosis not present

## 2011-08-16 ENCOUNTER — Ambulatory Visit (HOSPITAL_COMMUNITY): Payer: Medicare Other | Admitting: *Deleted

## 2011-08-16 ENCOUNTER — Ambulatory Visit (INDEPENDENT_AMBULATORY_CARE_PROVIDER_SITE_OTHER): Payer: Medicare Other | Admitting: *Deleted

## 2011-08-16 DIAGNOSIS — Z7901 Long term (current) use of anticoagulants: Secondary | ICD-10-CM

## 2011-08-16 DIAGNOSIS — I4891 Unspecified atrial fibrillation: Secondary | ICD-10-CM

## 2011-08-17 ENCOUNTER — Ambulatory Visit (HOSPITAL_COMMUNITY)
Admission: RE | Admit: 2011-08-17 | Discharge: 2011-08-17 | Disposition: A | Discharge status: Home / Self Care | Payer: Medicare Other | Source: Ambulatory Visit | Attending: Internal Medicine | Admitting: Internal Medicine

## 2011-08-17 ENCOUNTER — Other Ambulatory Visit: Payer: Self-pay | Admitting: Cardiovascular Disease

## 2011-08-17 DIAGNOSIS — L97309 Non-pressure chronic ulcer of unspecified ankle with unspecified severity: Secondary | ICD-10-CM | POA: Diagnosis not present

## 2011-08-17 DIAGNOSIS — IMO0001 Reserved for inherently not codable concepts without codable children: Secondary | ICD-10-CM | POA: Diagnosis not present

## 2011-08-17 MED ORDER — METOPROLOL TARTRATE 25 MG PO TABS: 75.0000 mg | ORAL_TABLET | Freq: Two times a day (BID) | ORAL | Status: DC

## 2011-08-17 NOTE — Progress Notes (Signed)
Physical Therapy - Wound Therapy  Treatment  Patient Details  Name: [name] MRN: [id number] Date of Birth: 13-Mar-1941  Today's Date: 08/17/2011 Time: 7829-5621 Time Calculation (min): 28 min  Visit#: 5  of 5   Re-eval:   None needed Wound Therapy Wound 05/04/10 Abrasion(s);Other (Comment) Ankle Left;Lateral scratch that turned into ulcer; 2.0cmX1cmX0.25cm with 95% slough and 5% granulation (Active)  Site / Wound Assessment Clean;Dry 06/28/2011  8:18 AM  % Wound base Red or Granulating 100% 06/21/2011  8:30 AM  % Wound base Yellow 0% 06/21/2011  8:30 AM  % Wound base Black 0% 06/14/2011  8:37 AM  % Wound base Other (Comment) 0% 05/18/2011 10:53 AM  Peri-wound Assessment Intact 06/28/2011  8:18 AM  Wound Length (cm) 0 cm 06/28/2011  8:18 AM  Wound Width (cm) 0 cm 06/28/2011  8:18 AM  Wound Depth (cm) 0 cm 06/28/2011  8:18 AM  Tunneling (cm) 0 06/28/2011  8:18 AM  Undermining (cm) 0 06/28/2011  8:18 AM  Margins Attached edges (approximated) 06/28/2011  8:18 AM  Drainage Amount None 06/28/2011  8:18 AM  Drainage Description Serous 06/14/2011  8:37 AM  Non-staged Wound Description Not applicable 06/28/2011  8:18 AM  Treatment Cleansed 06/21/2011  8:30 AM  Dressing Type Hydrogel 05/25/2011  9:00 AM  Dressing Changed Changed 06/21/2011  8:30 AM  Dressing Status None 06/28/2011  8:18 AM     Wound 08/03/11 Skin tear Hand MCP at index finger  (Active)  Site / Wound Assessment Clean;Granulation tissue 08/17/2011  8:35 AM  % Wound base Red or Granulating 100% 08/17/2011  8:35 AM  % Wound base Yellow 0% 08/17/2011  8:35 AM  % Wound base Black 0% 08/14/2011  9:00 AM  Peri-wound Assessment Intact 08/17/2011  8:35 AM  Wound Length (cm) 1.5 cm 08/17/2011  8:35 AM  Wound Width (cm) 1.25 cm 08/17/2011  8:35 AM  Wound Depth (cm) 0 cm 08/17/2011  8:35 AM  Margins Attached edges (approximated) 08/17/2011  8:35 AM  Closure None 08/17/2011  8:35 AM  Drainage Amount Scant 08/17/2011  8:35 AM  Drainage Description Serous  08/17/2011  8:35 AM  Non-staged Wound Description Partial thickness 08/17/2011  8:35 AM  Treatment Cleansed;Debridement (Selective) 08/17/2011  8:35 AM  Dressing Type Impregnated gauze (bismuth) 08/17/2011  8:35 AM  Dressing Changed New 08/14/2011  9:00 AM  Dressing Status Clean;Intact;Dry 08/17/2011  8:35 AM     Wound 08/03/11 Skin tear Hand Right mcp at little finger (Active)  Site / Wound Assessment Granulation tissue 08/17/2011  8:35 AM  % Wound base Red or Granulating 100% 08/17/2011  8:35 AM  % Wound base Yellow 0% 08/17/2011  8:35 AM  % Wound base Black 0% 08/14/2011  9:00 AM  Peri-wound Assessment Intact 08/17/2011  8:35 AM  Wound Length (cm) 1.25 cm 08/17/2011  8:35 AM  Wound Width (cm) 0.75 cm 08/17/2011  8:35 AM  Margins Attached edges (approximated) 08/17/2011  8:35 AM  Drainage Amount Scant 08/17/2011  8:35 AM  Drainage Description Serous 08/17/2011  8:35 AM  Non-staged Wound Description Partial thickness 08/17/2011  8:35 AM  Treatment Cleansed;Debridement (Selective) 08/17/2011  8:35 AM  Dressing Type Impregnated gauze (bismuth) 08/17/2011  8:35 AM  Dressing Changed Changed 08/17/2011  8:35 AM  Dressing Status Clean;Dry 08/17/2011  8:35 AM       Physical Therapy Assessment and Plan Wound Therapy - Assess/Plan/Recommendations Wound Therapy - Clinical Statement: Pt much clamer this treatment.  Wound has improved significantly.  Wife and husband  are in agreement that they are able to handle wound care at this time.  No skilled threapy is needed at this time Hydrotherapy Plan: Other (comment) (discharge)      Goals  met pt ready for discharge.  Problem List Patient Active Problem List  Diagnoses  . HYPERLIPIDEMIA  . HYPERTENSION  . ATRIAL FIBRILLATION  . ACUTE ON CHRONIC DIASTOLIC HEART FAILURE  . Occlusion and stenosis of carotid artery without mention of cerebral infarction  . SYNCOPE AND COLLAPSE  . Long term current use of anticoagulant  . Wound of ankle    GP Current G8990 CI   Discharge G8990 CI Darnita Woodrum,[name] 08/17/2011, 8:42 AM

## 2011-08-17 NOTE — Telephone Encounter (Signed)
Refill- Metoprolol tartrate (LOPRESSOR) 25 MG tablet  Verified new preferred as [facility]

## 2011-08-20 ENCOUNTER — Ambulatory Visit (INDEPENDENT_AMBULATORY_CARE_PROVIDER_SITE_OTHER): Payer: Medicare Other | Admitting: *Deleted

## 2011-08-20 DIAGNOSIS — I4891 Unspecified atrial fibrillation: Secondary | ICD-10-CM | POA: Diagnosis not present

## 2011-08-20 DIAGNOSIS — Z7901 Long term (current) use of anticoagulants: Secondary | ICD-10-CM

## 2011-08-20 LAB — POCT INR: INR: 1.9

## 2011-08-21 ENCOUNTER — Ambulatory Visit (HOSPITAL_COMMUNITY): Payer: Medicare Other | Admitting: *Deleted

## 2011-08-23 ENCOUNTER — Ambulatory Visit (HOSPITAL_COMMUNITY): Payer: Medicare Other | Admitting: Physical Therapy

## 2011-08-23 ENCOUNTER — Ambulatory Visit (INDEPENDENT_AMBULATORY_CARE_PROVIDER_SITE_OTHER): Payer: Medicare Other | Admitting: *Deleted

## 2011-08-23 DIAGNOSIS — Z7901 Long term (current) use of anticoagulants: Secondary | ICD-10-CM

## 2011-08-23 DIAGNOSIS — I4891 Unspecified atrial fibrillation: Secondary | ICD-10-CM

## 2011-08-23 LAB — POCT INR: INR: 1.3

## 2011-08-28 ENCOUNTER — Ambulatory Visit (HOSPITAL_COMMUNITY): Payer: Medicare Other

## 2011-08-30 ENCOUNTER — Ambulatory Visit (HOSPITAL_COMMUNITY): Payer: Medicare Other

## 2011-08-30 ENCOUNTER — Ambulatory Visit (INDEPENDENT_AMBULATORY_CARE_PROVIDER_SITE_OTHER): Payer: Medicare Other | Admitting: *Deleted

## 2011-08-30 DIAGNOSIS — Z7901 Long term (current) use of anticoagulants: Secondary | ICD-10-CM

## 2011-08-30 DIAGNOSIS — I4891 Unspecified atrial fibrillation: Secondary | ICD-10-CM

## 2011-08-31 ENCOUNTER — Ambulatory Visit (HOSPITAL_COMMUNITY): Payer: Medicare Other

## 2011-09-04 ENCOUNTER — Ambulatory Visit (HOSPITAL_COMMUNITY): Payer: Medicare Other | Admitting: *Deleted

## 2011-09-06 ENCOUNTER — Ambulatory Visit (HOSPITAL_COMMUNITY): Payer: Medicare Other | Admitting: Physical Therapy

## 2011-09-06 ENCOUNTER — Ambulatory Visit (INDEPENDENT_AMBULATORY_CARE_PROVIDER_SITE_OTHER): Payer: Medicare Other | Admitting: *Deleted

## 2011-09-06 ENCOUNTER — Encounter: Payer: Self-pay | Admitting: *Deleted

## 2011-09-06 DIAGNOSIS — I4891 Unspecified atrial fibrillation: Secondary | ICD-10-CM

## 2011-09-06 DIAGNOSIS — Z7901 Long term (current) use of anticoagulants: Secondary | ICD-10-CM | POA: Diagnosis not present

## 2011-09-06 NOTE — Progress Notes (Signed)
This encounter was created in error - please disregard.

## 2011-09-07 ENCOUNTER — Ambulatory Visit (HOSPITAL_COMMUNITY): Payer: Medicare Other

## 2011-09-21 DIAGNOSIS — H1130 Conjunctival hemorrhage, unspecified eye: Secondary | ICD-10-CM | POA: Diagnosis not present

## 2011-09-25 DIAGNOSIS — Z961 Presence of intraocular lens: Secondary | ICD-10-CM | POA: Diagnosis not present

## 2011-09-26 ENCOUNTER — Ambulatory Visit (INDEPENDENT_AMBULATORY_CARE_PROVIDER_SITE_OTHER): Payer: Medicare Other | Admitting: *Deleted

## 2011-09-26 DIAGNOSIS — Z7901 Long term (current) use of anticoagulants: Secondary | ICD-10-CM | POA: Diagnosis not present

## 2011-09-26 DIAGNOSIS — I4891 Unspecified atrial fibrillation: Secondary | ICD-10-CM | POA: Diagnosis not present

## 2011-09-26 LAB — POCT INR: INR: 1.7

## 2011-10-08 DIAGNOSIS — M545 Low back pain: Secondary | ICD-10-CM | POA: Diagnosis not present

## 2011-10-17 ENCOUNTER — Ambulatory Visit (INDEPENDENT_AMBULATORY_CARE_PROVIDER_SITE_OTHER): Payer: Medicare Other | Admitting: *Deleted

## 2011-10-17 DIAGNOSIS — I4891 Unspecified atrial fibrillation: Secondary | ICD-10-CM

## 2011-10-17 DIAGNOSIS — Z7901 Long term (current) use of anticoagulants: Secondary | ICD-10-CM | POA: Diagnosis not present

## 2011-10-31 ENCOUNTER — Other Ambulatory Visit: Payer: Self-pay | Admitting: *Deleted

## 2011-10-31 MED ORDER — METOPROLOL TARTRATE 25 MG PO TABS: 75.0000 mg | ORAL_TABLET | Freq: Two times a day (BID) | ORAL | Status: DC

## 2011-11-07 ENCOUNTER — Ambulatory Visit (INDEPENDENT_AMBULATORY_CARE_PROVIDER_SITE_OTHER): Payer: Medicare Other | Admitting: *Deleted

## 2011-11-07 DIAGNOSIS — I4891 Unspecified atrial fibrillation: Secondary | ICD-10-CM

## 2011-11-07 DIAGNOSIS — Z7901 Long term (current) use of anticoagulants: Secondary | ICD-10-CM

## 2011-11-19 ENCOUNTER — Ambulatory Visit (INDEPENDENT_AMBULATORY_CARE_PROVIDER_SITE_OTHER): Payer: Medicare Other | Admitting: Cardiovascular Disease

## 2011-11-19 ENCOUNTER — Encounter: Payer: Self-pay | Admitting: Cardiovascular Disease

## 2011-11-19 ENCOUNTER — Ambulatory Visit (INDEPENDENT_AMBULATORY_CARE_PROVIDER_SITE_OTHER): Payer: Medicare Other | Admitting: *Deleted

## 2011-11-19 VITALS — BP 128/74 | HR 59 | Ht 65.5 in | Wt 116.0 lb

## 2011-11-19 DIAGNOSIS — I5033 Acute on chronic diastolic (congestive) heart failure: Secondary | ICD-10-CM

## 2011-11-19 DIAGNOSIS — E785 Hyperlipidemia, unspecified: Secondary | ICD-10-CM

## 2011-11-19 DIAGNOSIS — Z7901 Long term (current) use of anticoagulants: Secondary | ICD-10-CM | POA: Diagnosis not present

## 2011-11-19 DIAGNOSIS — I10 Essential (primary) hypertension: Secondary | ICD-10-CM | POA: Diagnosis not present

## 2011-11-19 DIAGNOSIS — I4891 Unspecified atrial fibrillation: Secondary | ICD-10-CM

## 2011-11-19 DIAGNOSIS — I6529 Occlusion and stenosis of unspecified carotid artery: Secondary | ICD-10-CM

## 2011-11-19 NOTE — Assessment & Plan Note (Signed)
Postural symptoms stable  F/U VVS duplex next month.  Would be high risk for surgical correction

## 2011-11-19 NOTE — Progress Notes (Signed)
Patient ID: [name], female   DOB: 20-Jul-1940, 71 y.o.   MRN: [id number] [name] is seen for F/U of PAF, bruit, right subclavian steal and anticoagulatin She has known vascular disease primarily in the sublavian and carotids. She was admitted with syncope last year . She had PAF with diastolic heart failure. Her cath showed only diagonal disease with no critical major vessel disease. EF was normal. 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 She has 60-79% stenosis on left ICA. In combination with rapid atrial fibrillation this may have contributed to a brief loss of consdousness. Her diastolic CHF cleared with restoration of NSR. She was sent home on low dose amiodarone. She has had no SOB, or palpitations since being home. there have been no bleeding problems. She denies SSCP, Has had an ulcer on the lateral aspect of the left ankle. Seeing wound care center and has been on antibiotics This has made her coumadin hard to control.  Reviewed duplex from [facility] 9/1 60-79% LICA stenosis. Innominate stenosis.  Has F/U duplex with [facility]  Next month  Had bad coumadin visit with Weston Brass.  Discussed issues.  She wants to stay on coumadin and I think some of the inconsistancy in INR;s has been starting and stopping it for proceedures.  Has thin skin and bruises all the time.  Told her she would be a candidate for Eliquis lower dose but she preferes to stay on coumadin  ROS: Denies fever, malais, weight loss, blurry vision, decreased visual acuity, cough, sputum, SOB, hemoptysis, pleuritic pain, palpitaitons, heartburn, abdominal pain, melena, lower extremity edema, claudication, or rash.  All other systems reviewed and negative  General: Affect appropriate Frail elderly femlae HEENT: normal Neck supple with no adenopathy JVP normal left carotid /subclavian bruits no thyromegaly Lungs clear with no wheezing and good diaphragmatic  motion Heart:  S1/S2 no murmur, no rub, gallop or click PMI normal Abdomen: benighn, BS positve, no tenderness, no AAA no bruit.  No HSM or HJR Distal pulses intact with no bruits No edema Neuro non-focal Skin warm and dry multiple echymosis on arms and legs No muscular weakness   Current Outpatient Prescriptions  Medication Sig Dispense Refill  . alendronate (FOSAMAX) 70 MG tablet Take 70 mg by mouth every 7 (seven) days. Take in the morning with a full glass of water, on an empty stomach, and do not take anything else by mouth or lie down for the next 30 min. Patient take on Sunday      . amiodarone (PACERONE) 200 MG tablet Take 1 tablet (200 mg total) by mouth 2 (two) times daily.  180 tablet  2  . amLODipine-valsartan (EXFORGE) 5-160 MG per tablet Take 1 tablet by mouth daily.  90 tablet  3  . aspirin EC 81 MG tablet Take 81 mg by mouth daily.        . calcium-vitamin D (OSCAL) 250-125 MG-UNIT per tablet Take 1 tablet by mouth 2 (two) times daily.        . clopidogrel (PLAVIX) 75 MG tablet Take 1 tablet (75 mg total) by mouth every morning.  90 tablet  3  . colesevelam (WELCHOL) 625 MG tablet Take 3 tablets (1,875 mg total) by mouth 3 (three) times daily.  [id number] tablet  2  . fish oil-omega-3 fatty acids 1000 MG capsule Take 1 g by mouth daily.       Marland Kitchen HYDROcodone-acetaminophen (NORCO) 5-325 MG  per tablet Take 1 tablet by mouth every 6 (six) hours as needed. pain      . Iron-FA-B Cmp-C-Biot-Probiotic (FUSION PLUS) CAPS Take 1 capsule by mouth daily.      . meloxicam (MOBIC) 15 MG tablet Take 15 mg by mouth every morning.        . metoprolol tartrate (LOPRESSOR) 25 MG tablet Take 3 tablets (75 mg total) by mouth 2 (two) times daily.  270 tablet  3  . Multiple Vitamin (MULTIVITAMIN) capsule Take 1 capsule by mouth daily.        . pravastatin (PRAVACHOL) 40 MG tablet Take 1 tablet (40 mg total) by mouth at bedtime.  90 tablet  3  . warfarin (COUMADIN) 4 MG tablet Take 4 mg by mouth See  admin instructions. Take one tablet (4mg ) by mouth on Sundays and Thursdays. Take one half tablet (2mg ) on all other days      . ferrous sulfate 325 (65 FE) MG EC tablet Take 1 tablet (325 mg total) by mouth 2 (two) times daily.        Allergies  Iodine; Iohexol; and Tape  Electrocardiogram:  NSR rate 59  PR 254  Otherwise normal  Assessment and Plan

## 2011-11-19 NOTE — Assessment & Plan Note (Signed)
Well controlled.  Continue current medications and low sodium Dash type diet.    

## 2011-11-19 NOTE — Patient Instructions (Addendum)
Your physician recommends that you continue on your current medications as directed. Please refer to the Current Medication list given to you today.  Your physician recommends that you schedule a follow-up appointment in: 6 months  

## 2011-11-19 NOTE — Assessment & Plan Note (Signed)
Cholesterol is at goal.  Continue current dose of statin and diet Rx.  No myalgias or side effects.  F/U  LFT's in 6 months. Lab Results  Component Value Date   LDLCALC 60 08/11/2010             

## 2011-11-19 NOTE — Assessment & Plan Note (Signed)
Maint NSR  Continue coumadin INR check today.  History of CVA needs lifelong anticoagulation

## 2011-11-20 ENCOUNTER — Other Ambulatory Visit: Payer: Self-pay | Admitting: *Deleted

## 2011-11-20 MED ORDER — AMIODARONE HCL 200 MG PO TABS: 200.0000 mg | ORAL_TABLET | Freq: Two times a day (BID) | ORAL | Status: DC

## 2011-11-28 DIAGNOSIS — I259 Chronic ischemic heart disease, unspecified: Secondary | ICD-10-CM | POA: Diagnosis not present

## 2011-11-28 DIAGNOSIS — E785 Hyperlipidemia, unspecified: Secondary | ICD-10-CM | POA: Diagnosis not present

## 2011-11-28 DIAGNOSIS — I4891 Unspecified atrial fibrillation: Secondary | ICD-10-CM | POA: Diagnosis not present

## 2011-11-28 DIAGNOSIS — I10 Essential (primary) hypertension: Secondary | ICD-10-CM | POA: Diagnosis not present

## 2011-11-30 ENCOUNTER — Other Ambulatory Visit: Payer: Self-pay | Admitting: Orthopaedic Surgery

## 2011-11-30 DIAGNOSIS — M549 Dorsalgia, unspecified: Secondary | ICD-10-CM

## 2011-12-06 ENCOUNTER — Inpatient Hospital Stay: Admission: RE | Admit: 2011-12-06 | Payer: Medicare Other | Source: Ambulatory Visit

## 2011-12-06 ENCOUNTER — Ambulatory Visit (INDEPENDENT_AMBULATORY_CARE_PROVIDER_SITE_OTHER): Payer: Medicare Other | Admitting: *Deleted

## 2011-12-06 DIAGNOSIS — I4891 Unspecified atrial fibrillation: Secondary | ICD-10-CM

## 2011-12-06 DIAGNOSIS — Z7901 Long term (current) use of anticoagulants: Secondary | ICD-10-CM

## 2011-12-06 LAB — POCT INR: INR: 1.2

## 2011-12-07 ENCOUNTER — Telehealth: Payer: Self-pay | Admitting: *Deleted

## 2011-12-07 ENCOUNTER — Other Ambulatory Visit: Payer: Self-pay | Admitting: Orthopaedic Surgery

## 2011-12-07 ENCOUNTER — Ambulatory Visit
Admission: RE | Admit: 2011-12-07 | Discharge: 2011-12-07 | Disposition: A | Discharge status: Home / Self Care | Payer: Medicare Other | Source: Ambulatory Visit | Attending: Orthopaedic Surgery | Admitting: Orthopaedic Surgery

## 2011-12-07 VITALS — BP 147/71 | HR 60

## 2011-12-07 DIAGNOSIS — M549 Dorsalgia, unspecified: Secondary | ICD-10-CM

## 2011-12-07 DIAGNOSIS — M47817 Spondylosis without myelopathy or radiculopathy, lumbosacral region: Secondary | ICD-10-CM | POA: Diagnosis not present

## 2011-12-07 MED ORDER — IOHEXOL 180 MG/ML  SOLN
1.0000 mL | Freq: Once | INTRAMUSCULAR | Status: AC | PRN
  Administered 2011-12-07: 1 mL via EPIDURAL

## 2011-12-07 MED ORDER — DIAZEPAM 5 MG PO TABS
5.0000 mg | ORAL_TABLET | Freq: Once | ORAL | Status: AC
  Administered 2011-12-07: 5 mg via ORAL

## 2011-12-07 MED ORDER — METHYLPREDNISOLONE ACETATE 40 MG/ML INJ SUSP (RADIOLOG
120.0000 mg | Freq: Once | INTRAMUSCULAR | Status: AC
  Administered 2011-12-07: 120 mg via EPIDURAL

## 2011-12-07 NOTE — Telephone Encounter (Signed)
Please put [name] on CCR schedule, per her request, for me to check her INR on November 5th at [facility].  Thank you!

## 2011-12-17 ENCOUNTER — Other Ambulatory Visit: Payer: Self-pay | Admitting: Cardiovascular Disease

## 2011-12-18 MED ORDER — COLESEVELAM HCL 625 MG PO TABS: 1875.0000 mg | ORAL_TABLET | Freq: Three times a day (TID) | ORAL | Status: DC

## 2011-12-20 ENCOUNTER — Ambulatory Visit (INDEPENDENT_AMBULATORY_CARE_PROVIDER_SITE_OTHER): Payer: Medicare Other | Admitting: *Deleted

## 2011-12-20 DIAGNOSIS — Z7901 Long term (current) use of anticoagulants: Secondary | ICD-10-CM

## 2011-12-20 DIAGNOSIS — I4891 Unspecified atrial fibrillation: Secondary | ICD-10-CM | POA: Diagnosis not present

## 2011-12-26 ENCOUNTER — Telehealth: Payer: Self-pay | Admitting: *Deleted

## 2011-12-26 NOTE — Telephone Encounter (Signed)
Received a call from [facility] regarding patients Welchol and needing clarification in directions.  Rx sent for Welchol 3 tablets 3 times a day and [facility] said previous sig 3 twice a day.  After reviewing chart it is documented 3 tablets 3 times a day in last two office notes.  Did call patient and speak to her and she stated she had been taking 3 tablets twice a day.  Will forward to Dr Eden Emms to clarify what he would like patient to be taking.

## 2011-12-26 NOTE — Telephone Encounter (Signed)
Should be 3 tablets twice daily

## 2011-12-27 NOTE — Telephone Encounter (Signed)
Spoke with [facility] and gave correct sig

## 2011-12-31 ENCOUNTER — Encounter: Payer: Self-pay | Admitting: Neurosurgery

## 2012-01-01 ENCOUNTER — Encounter: Payer: Self-pay | Admitting: Neurosurgery

## 2012-01-01 ENCOUNTER — Ambulatory Visit (INDEPENDENT_AMBULATORY_CARE_PROVIDER_SITE_OTHER): Payer: Medicare Other | Admitting: Vascular Surgery

## 2012-01-01 ENCOUNTER — Ambulatory Visit (INDEPENDENT_AMBULATORY_CARE_PROVIDER_SITE_OTHER): Payer: Medicare Other | Admitting: Neurosurgery

## 2012-01-01 VITALS — BP 107/65 | HR 60 | Resp 16 | Ht 65.0 in | Wt 116.5 lb

## 2012-01-01 DIAGNOSIS — I6529 Occlusion and stenosis of unspecified carotid artery: Secondary | ICD-10-CM | POA: Diagnosis not present

## 2012-01-01 NOTE — Progress Notes (Signed)
Carotid duplex performed @ [facility] 01/01/2012

## 2012-01-01 NOTE — Addendum Note (Signed)
Addended by: Sharee Pimple on: 01/01/2012 04:04 PM   Modules accepted: Orders

## 2012-01-01 NOTE — Progress Notes (Signed)
[facility] Carotid Office Note  CC: Six-month carotid surveillance Referring Physician: Brabham  History of Present Illness: 71 year old female patient of Dr. Myra Gianotti with no history of carotid intervention comes in for carotid duplex surveillance. The patient denies any signs or symptoms of CVA, TIA, amaurosis fugax or any neural deficit. The patient does have a complicated medical history.  Past Medical History  Diagnosis Date  . Subclavian steal syndrome   . Carotid bruit     LICA 60-79% (duplex 9/11)  . Hypertension   . Hyperlipidemia   . Atrial fibrillation   . Anticoagulation management encounter   . Meningioma   . Osteoporosis   . Stroke   . Pneumonia   . Breast cancer     S/P left mastectomy    ROS: [x]  Positive   [ ]  Denies    General: [ ]  Weight loss, [ ]  Fever, [ ]  chills Neurologic: [ ]  Dizziness, [ ]  Blackouts, [ ]  Seizure [ ]  Stroke, [ ]  "Mini stroke", [ ]  Slurred speech, [ ]  Temporary blindness; [ ]  weakness in arms or legs, [ ]  Hoarseness Cardiac: [ ]  Chest pain/pressure, [ ]  Shortness of breath at rest [ ]  Shortness of breath with exertion, [ ]  Atrial fibrillation or irregular heartbeat Vascular: [ ]  Pain in legs with walking, [ ]  Pain in legs at rest, [ ]  Pain in legs at night,  [ ]  Non-healing ulcer, [ ]  Blood clot in vein/DVT,   Pulmonary: [ ]  Home oxygen, [ ]  Productive cough, [ ]  Coughing up blood, [ ]  Asthma,  [ ]  Wheezing Musculoskeletal:  [ ]  Arthritis, [ ]  Low back pain, [ ]  Joint pain Hematologic: [ ]  Easy Bruising, [ ]  Anemia; [ ]  Hepatitis Gastrointestinal: [ ]  Blood in stool, [ ]  Gastroesophageal Reflux/heartburn, [ ]  Trouble swallowing Urinary: [ ]  chronic Kidney disease, [ ]  on HD - [ ]  MWF or [ ]  TTHS, [ ]  Burning with urination, [ ]  Difficulty urinating Skin: [ ]  Rashes, [ ]  Wounds Psychological: [ ]  Anxiety, [ ]  Depression   Social History History  Substance Use Topics  . Smoking status: Former Smoker   Types: Cigarettes  . Smokeless tobacco: Not on file   Comment: x 4 yrs.  . Alcohol Use: Yes     occasional alcohol    Family History Family History  Problem Relation Age of Onset  . Alzheimer's disease Mother   . Dementia Mother   . Parkinsonism Father     Allergies  Allergen Reactions  . Iodine Rash and Other (See Comments)    BETADINE Rash/burning, blisters on skin.  . Iohexol Rash and Other (See Comments)    Blisters; PT NEEDS 13-HOUR PREP   . Tape Itching and Rash    PAPER TAPE    Current Outpatient Prescriptions  Medication Sig Dispense Refill  . alendronate (FOSAMAX) 70 MG tablet Take 70 mg by mouth every 7 (seven) days. Take in the morning with a full glass of water, on an empty stomach, and do not take anything else by mouth or lie down for the next 30 min. Patient take on Sunday      . amiodarone (PACERONE) 200 MG tablet Take 1 tablet (200 mg total) by mouth 2 (two) times daily.  180 tablet  2  . amLODipine-valsartan (EXFORGE) 5-160 MG per tablet Take 1 tablet by mouth daily.  90 tablet  3  . aspirin EC 81 MG tablet Take 81 mg by mouth  daily.        . calcium-vitamin D (OSCAL) 250-125 MG-UNIT per tablet Take 1 tablet by mouth 2 (two) times daily.        . clopidogrel (PLAVIX) 75 MG tablet Take 1 tablet (75 mg total) by mouth every morning.  90 tablet  3  . colesevelam (WELCHOL) 625 MG tablet Take 1,875 mg by mouth 2 (two) times daily with a meal.      . ferrous sulfate 325 (65 FE) MG EC tablet Take 325 mg by mouth 2 (two) times daily.      . fish oil-omega-3 fatty acids 1000 MG capsule Take 1 g by mouth daily.       Marland Kitchen HYDROcodone-acetaminophen (NORCO) 5-325 MG per tablet Take 1 tablet by mouth every 6 (six) hours as needed. pain      . Iron-FA-B Cmp-C-Biot-Probiotic (FUSION PLUS) CAPS Take 1 capsule by mouth daily.      . meloxicam (MOBIC) 15 MG tablet Take 15 mg by mouth every morning.        . metoprolol tartrate (LOPRESSOR) 25 MG tablet Take 3 tablets (75 mg  total) by mouth 2 (two) times daily.  270 tablet  3  . Multiple Vitamin (MULTIVITAMIN) capsule Take 1 capsule by mouth daily.        . pravastatin (PRAVACHOL) 40 MG tablet Take 1 tablet (40 mg total) by mouth at bedtime.  90 tablet  3  . warfarin (COUMADIN) 4 MG tablet Take 4 mg by mouth See admin instructions. Take one tablet (4mg ) by mouth on Sundays and Thursdays. Take one half tablet (2mg ) on all other days      . DISCONTD: ferrous sulfate 325 (65 FE) MG EC tablet Take 1 tablet (325 mg total) by mouth 2 (two) times daily.        Physical Examination  Filed Vitals:   01/01/12 1459  BP: 107/65  Pulse: 60  Resp:     Body mass index is 19.39 kg/(m^2).  General:  WDWN in NAD Gait: Normal HEENT: WNL Eyes: Pupils equal Pulmonary: normal non-labored breathing , without Rales, rhonchi,  wheezing Cardiac: RRR, without  Murmurs, rubs or gallops; Abdomen: soft, NT, no masses Skin: no rashes, ulcers noted  Vascular Exam Pulses: 2+ radial pulses bilaterally Carotid bruits: Soft carotid bruit heard on the left with a right-sided occlusion Extremities without ischemic changes, no Gangrene , no cellulitis; no open wounds;  Musculoskeletal: no muscle wasting or atrophy   Neurologic: A&O X 3; Appropriate Affect ; SENSATION: normal; MOTOR FUNCTION:  moving all extremities equally. 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 The patient will followup in 6 months for repeat carotid duplex. The patient's questions were encouraged and answered, she is in agreement with this plan.  Lauree Chandler ANP Clinic MD: [place]

## 2012-01-03 ENCOUNTER — Ambulatory Visit (INDEPENDENT_AMBULATORY_CARE_PROVIDER_SITE_OTHER): Payer: Medicare Other | Admitting: *Deleted

## 2012-01-03 DIAGNOSIS — I4891 Unspecified atrial fibrillation: Secondary | ICD-10-CM

## 2012-01-03 DIAGNOSIS — Z7901 Long term (current) use of anticoagulants: Secondary | ICD-10-CM

## 2012-01-03 LAB — POCT INR: INR: 2.1

## 2012-01-08 ENCOUNTER — Telehealth: Payer: Self-pay | Admitting: Cardiovascular Disease

## 2012-01-08 NOTE — Telephone Encounter (Signed)
PT'S HUSBAND AWARE HAVE ACCESS  TO VEIN VASCULAR RECORDS  ALL CONNECTED WITH EPIC./CY

## 2012-01-08 NOTE — Telephone Encounter (Signed)
pt wants to know if we received her records from [facility]? pls call and let her know  [phone]

## 2012-01-11 DIAGNOSIS — M545 Low back pain: Secondary | ICD-10-CM | POA: Diagnosis not present

## 2012-01-24 ENCOUNTER — Ambulatory Visit (INDEPENDENT_AMBULATORY_CARE_PROVIDER_SITE_OTHER): Payer: Medicare Other | Admitting: *Deleted

## 2012-01-24 DIAGNOSIS — Z7901 Long term (current) use of anticoagulants: Secondary | ICD-10-CM

## 2012-01-24 DIAGNOSIS — I4891 Unspecified atrial fibrillation: Secondary | ICD-10-CM | POA: Diagnosis not present

## 2012-01-24 LAB — POCT INR: INR: 2.2

## 2012-02-13 ENCOUNTER — Other Ambulatory Visit: Payer: Medicare Other

## 2012-02-13 ENCOUNTER — Ambulatory Visit (INDEPENDENT_AMBULATORY_CARE_PROVIDER_SITE_OTHER): Payer: Medicare Other | Admitting: *Deleted

## 2012-02-13 DIAGNOSIS — Z7901 Long term (current) use of anticoagulants: Secondary | ICD-10-CM

## 2012-02-13 DIAGNOSIS — I4891 Unspecified atrial fibrillation: Secondary | ICD-10-CM

## 2012-02-13 LAB — POCT INR: INR: 1.2

## 2012-02-14 ENCOUNTER — Inpatient Hospital Stay: Admission: RE | Admit: 2012-02-14 | Payer: Medicare Other | Source: Ambulatory Visit

## 2012-02-14 ENCOUNTER — Ambulatory Visit
Admission: RE | Admit: 2012-02-14 | Discharge: 2012-02-14 | Disposition: A | Discharge status: Home / Self Care | Payer: Medicare Other | Source: Ambulatory Visit | Attending: Orthopaedic Surgery | Admitting: Orthopaedic Surgery

## 2012-02-14 VITALS — BP 83/61 | HR 66

## 2012-02-14 DIAGNOSIS — M549 Dorsalgia, unspecified: Secondary | ICD-10-CM

## 2012-02-14 DIAGNOSIS — M5126 Other intervertebral disc displacement, lumbar region: Secondary | ICD-10-CM | POA: Diagnosis not present

## 2012-02-14 MED ORDER — DIAZEPAM 5 MG PO TABS
5.0000 mg | ORAL_TABLET | Freq: Once | ORAL | Status: AC
  Administered 2012-02-14: 5 mg via ORAL

## 2012-02-14 MED ORDER — IOHEXOL 180 MG/ML  SOLN: 1.0000 mL | Freq: Once | INTRAMUSCULAR | Status: AC | PRN

## 2012-02-14 MED ORDER — METHYLPREDNISOLONE ACETATE 40 MG/ML INJ SUSP (RADIOLOG: 120.0000 mg | Freq: Once | INTRAMUSCULAR | Status: DC

## 2012-02-25 ENCOUNTER — Ambulatory Visit (INDEPENDENT_AMBULATORY_CARE_PROVIDER_SITE_OTHER): Payer: Medicare Other | Admitting: *Deleted

## 2012-02-25 DIAGNOSIS — Z7901 Long term (current) use of anticoagulants: Secondary | ICD-10-CM | POA: Diagnosis not present

## 2012-02-25 DIAGNOSIS — I4891 Unspecified atrial fibrillation: Secondary | ICD-10-CM

## 2012-02-28 ENCOUNTER — Encounter: Payer: Self-pay | Admitting: Cardiovascular Disease

## 2012-02-28 DIAGNOSIS — D649 Anemia, unspecified: Secondary | ICD-10-CM | POA: Diagnosis not present

## 2012-02-28 DIAGNOSIS — I2510 Atherosclerotic heart disease of native coronary artery without angina pectoris: Secondary | ICD-10-CM | POA: Diagnosis not present

## 2012-02-28 DIAGNOSIS — I10 Essential (primary) hypertension: Secondary | ICD-10-CM | POA: Diagnosis not present

## 2012-02-28 DIAGNOSIS — I4891 Unspecified atrial fibrillation: Secondary | ICD-10-CM | POA: Diagnosis not present

## 2012-02-28 DIAGNOSIS — E785 Hyperlipidemia, unspecified: Secondary | ICD-10-CM | POA: Diagnosis not present

## 2012-03-12 ENCOUNTER — Ambulatory Visit (INDEPENDENT_AMBULATORY_CARE_PROVIDER_SITE_OTHER): Payer: Medicare Other | Admitting: *Deleted

## 2012-03-12 DIAGNOSIS — I4891 Unspecified atrial fibrillation: Secondary | ICD-10-CM | POA: Diagnosis not present

## 2012-03-12 DIAGNOSIS — Z7901 Long term (current) use of anticoagulants: Secondary | ICD-10-CM

## 2012-03-21 ENCOUNTER — Other Ambulatory Visit (HOSPITAL_COMMUNITY): Payer: Self-pay | Admitting: Internal Medicine

## 2012-03-21 DIAGNOSIS — Z139 Encounter for screening, unspecified: Secondary | ICD-10-CM

## 2012-03-25 ENCOUNTER — Other Ambulatory Visit: Payer: Self-pay | Admitting: *Deleted

## 2012-03-25 MED ORDER — PRAVASTATIN SODIUM 40 MG PO TABS: 40.0000 mg | ORAL_TABLET | Freq: Every day | ORAL | Status: DC

## 2012-04-10 ENCOUNTER — Telehealth: Payer: Self-pay | Admitting: Cardiovascular Disease

## 2012-04-10 ENCOUNTER — Ambulatory Visit (INDEPENDENT_AMBULATORY_CARE_PROVIDER_SITE_OTHER): Payer: Medicare Other | Admitting: *Deleted

## 2012-04-10 DIAGNOSIS — Z7901 Long term (current) use of anticoagulants: Secondary | ICD-10-CM

## 2012-04-10 DIAGNOSIS — I4891 Unspecified atrial fibrillation: Secondary | ICD-10-CM | POA: Diagnosis not present

## 2012-04-10 NOTE — Telephone Encounter (Signed)
Pt needs refill of mobic 5mg ,  [facility] 90 days supply with refills

## 2012-04-11 DIAGNOSIS — M545 Low back pain: Secondary | ICD-10-CM | POA: Diagnosis not present

## 2012-04-11 NOTE — Telephone Encounter (Signed)
New problem:   Patient think she is not a candidate for  surgery on left spine.    Message attached to refill taken by [facility] on  1/2.

## 2012-04-11 NOTE — Telephone Encounter (Signed)
It was recommended to pt that she has spinal surgery, some form of "cage" placed around her spine.  Wants Dr. Fabio Bering thoughts as to her ability to pursue this.  Will forward to Dr. Eden Emms for review.

## 2012-04-13 NOTE — Telephone Encounter (Signed)
Would try to avoid any back surgery if possible

## 2012-04-14 NOTE — Telephone Encounter (Signed)
Pt notified of Dr. Fabio Bering recommendation.

## 2012-04-16 DIAGNOSIS — J069 Acute upper respiratory infection, unspecified: Secondary | ICD-10-CM | POA: Diagnosis not present

## 2012-04-21 DIAGNOSIS — M545 Low back pain: Secondary | ICD-10-CM | POA: Diagnosis not present

## 2012-04-21 DIAGNOSIS — I10 Essential (primary) hypertension: Secondary | ICD-10-CM | POA: Diagnosis not present

## 2012-04-22 ENCOUNTER — Ambulatory Visit (HOSPITAL_COMMUNITY)
Admission: RE | Admit: 2012-04-22 | Discharge: 2012-04-22 | Disposition: A | Discharge status: Home / Self Care | Payer: Medicare Other | Source: Ambulatory Visit | Attending: Internal Medicine | Admitting: Internal Medicine

## 2012-04-22 DIAGNOSIS — Z1231 Encounter for screening mammogram for malignant neoplasm of breast: Secondary | ICD-10-CM | POA: Diagnosis not present

## 2012-04-22 DIAGNOSIS — M5137 Other intervertebral disc degeneration, lumbosacral region: Secondary | ICD-10-CM | POA: Diagnosis not present

## 2012-04-22 DIAGNOSIS — M5126 Other intervertebral disc displacement, lumbar region: Secondary | ICD-10-CM | POA: Diagnosis not present

## 2012-04-22 DIAGNOSIS — Z139 Encounter for screening, unspecified: Secondary | ICD-10-CM

## 2012-05-05 ENCOUNTER — Telehealth: Payer: Self-pay | Admitting: Cardiovascular Disease

## 2012-05-05 DIAGNOSIS — M48061 Spinal stenosis, lumbar region without neurogenic claudication: Secondary | ICD-10-CM | POA: Diagnosis not present

## 2012-05-05 DIAGNOSIS — M5137 Other intervertebral disc degeneration, lumbosacral region: Secondary | ICD-10-CM | POA: Diagnosis not present

## 2012-05-05 DIAGNOSIS — IMO0002 Reserved for concepts with insufficient information to code with codable children: Secondary | ICD-10-CM | POA: Diagnosis not present

## 2012-05-05 DIAGNOSIS — M545 Low back pain: Secondary | ICD-10-CM | POA: Diagnosis not present

## 2012-05-05 NOTE — Telephone Encounter (Signed)
SPOKE WITH PT, RE MESSAGE, REVIEWED  PT' S CHART AND FOUND THAT PT HAD CALLED ON Apr 14 2012  RE WANTING TO KNOW IF COULD HAVE SURGERY PER DR Eden Emms ON THAT PHONE NOTE  STATED  PT SHOULD AVOID ANY TYPE OF BACK SURGERY PT AWARE OF THE FOLLOWING . PT AGREES HAS U[PCOMING APPT WILL SEE DR Earlene Plater ON 05-13-12/CY

## 2012-05-05 NOTE — Telephone Encounter (Signed)
Pt saw dr Jeral Fruit today and wants nurse to call to get report from visit today and wants to know if she is a candidate for back surgery, pls call pt  [phone]

## 2012-05-07 ENCOUNTER — Ambulatory Visit (INDEPENDENT_AMBULATORY_CARE_PROVIDER_SITE_OTHER): Payer: Medicare Other | Admitting: *Deleted

## 2012-05-07 DIAGNOSIS — Z7901 Long term (current) use of anticoagulants: Secondary | ICD-10-CM

## 2012-05-07 DIAGNOSIS — I4891 Unspecified atrial fibrillation: Secondary | ICD-10-CM | POA: Diagnosis not present

## 2012-05-13 ENCOUNTER — Ambulatory Visit: Payer: Medicare Other | Admitting: Cardiovascular Disease

## 2012-05-15 ENCOUNTER — Telehealth: Payer: Self-pay | Admitting: Cardiovascular Disease

## 2012-05-15 ENCOUNTER — Other Ambulatory Visit: Payer: Self-pay | Admitting: *Deleted

## 2012-05-15 MED ORDER — CLOPIDOGREL BISULFATE 75 MG PO TABS: 75.0000 mg | ORAL_TABLET | ORAL | Status: DC

## 2012-05-15 MED ORDER — METOPROLOL TARTRATE 25 MG PO TABS: 75.0000 mg | ORAL_TABLET | Freq: Two times a day (BID) | ORAL | Status: DC

## 2012-05-15 NOTE — Telephone Encounter (Signed)
Pt need refill of plavix to [facility], asap she only has 10 pills left and going out of town

## 2012-05-19 ENCOUNTER — Ambulatory Visit: Payer: Medicare Other | Admitting: Cardiovascular Disease

## 2012-05-26 ENCOUNTER — Ambulatory Visit (INDEPENDENT_AMBULATORY_CARE_PROVIDER_SITE_OTHER): Payer: Medicare Other | Admitting: *Deleted

## 2012-05-26 DIAGNOSIS — Z7901 Long term (current) use of anticoagulants: Secondary | ICD-10-CM | POA: Diagnosis not present

## 2012-05-26 DIAGNOSIS — I4891 Unspecified atrial fibrillation: Secondary | ICD-10-CM | POA: Diagnosis not present

## 2012-05-26 LAB — POCT INR: INR: 2.6

## 2012-06-06 ENCOUNTER — Encounter: Payer: Self-pay | Admitting: Cardiovascular Disease

## 2012-06-06 ENCOUNTER — Ambulatory Visit (INDEPENDENT_AMBULATORY_CARE_PROVIDER_SITE_OTHER): Payer: Medicare Other | Admitting: Cardiovascular Disease

## 2012-06-06 VITALS — BP 126/72 | Ht 65.0 in | Wt 119.0 lb

## 2012-06-06 DIAGNOSIS — I6529 Occlusion and stenosis of unspecified carotid artery: Secondary | ICD-10-CM | POA: Diagnosis not present

## 2012-06-06 DIAGNOSIS — E785 Hyperlipidemia, unspecified: Secondary | ICD-10-CM

## 2012-06-06 DIAGNOSIS — I4891 Unspecified atrial fibrillation: Secondary | ICD-10-CM | POA: Diagnosis not present

## 2012-06-06 DIAGNOSIS — I10 Essential (primary) hypertension: Secondary | ICD-10-CM

## 2012-06-06 NOTE — Patient Instructions (Addendum)
Your physician wants you to follow-up in: 6 months with Dr. Eden Emms. You will receive a reminder letter in the mail two months in advance. If you don't receive a letter, please call our office to schedule the follow-up appointment.  Continue taking your currents medications as prescribed.

## 2012-06-06 NOTE — Assessment & Plan Note (Signed)
Maint NSR continue amiodarone Ok to stop coumadin 5 days before back injection with no lovenox bridge.  Had 3 back injections last year by [facility] and no problems stopping plavix and coumadin

## 2012-06-06 NOTE — Assessment & Plan Note (Signed)
Cholesterol is at goal.  Continue current dose of statin and diet Rx.  No myalgias or side effects.  F/U  LFT's in 6 months. Lab Results  Component Value Date   LDLCALC 60 08/11/2010

## 2012-06-06 NOTE — Assessment & Plan Note (Signed)
Well controlled.  Continue current medications and low sodium Dash type diet.    

## 2012-06-06 NOTE — Assessment & Plan Note (Signed)
Has f/u duplex with [facility] in March . LICA 60-79%  Plavix to be held for back injection

## 2012-06-06 NOTE — Progress Notes (Signed)
Patient ID: [name], female   DOB: 07/21/1940, 72 y.o.   MRN: [id number] [place] is seen for F/U of PAF, bruit, right subclavian steal and anticoagulatin She has known vascular disease primarily in the sublavian and carotids. She was admitted with syncope last year . She had PAF with diastolic heart failure. Her cath showed only diagonal disease with no critical major vessel disease. EF was normal. 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 She has 60-79% stenosis on left ICA. In combination with rapid atrial fibrillation this may have contributed to a brief loss of consdousness. Her diastolic CHF cleared with restoration of NSR. She was sent home on low dose amiodarone. She has had no SOB, or palpitations since being home. there have been no bleeding problems.  Reviewed duplex from [facility] 9/13  60-79% LICA stenosis. Innominate right occlusion with steal  ROS: Denies fever, malais, weight loss, blurry vision, decreased visual acuity, cough, sputum, SOB, hemoptysis, pleuritic pain, palpitaitons, heartburn, abdominal pain, melena, lower extremity edema, claudication, or rash.  All other systems reviewed and negative  General: Affect appropriate Chronically ill frail female HEENT: normal Neck supple with no adenopathy JVP normal bilateral carotid bruits and right subclavian  bruits no thyromegaly Lungs clear with no wheezing and good diaphragmatic motion Heart:  S1/S2 no murmur, no rub, gallop or click PMI normal Abdomen: benighn, BS positve, no tenderness, no AAA no bruit.  No HSM or HJR Distal pulses intact right arm pulses lower than left No edema Neuro non-focal Skin warm and dry bad chronic echymosis on hands No muscular weakness   Current Outpatient Prescriptions  Medication Sig Dispense Refill  . alendronate (FOSAMAX) 70 MG tablet Take 70 mg by mouth every 7 (seven) days. Take in the morning with a full glass of  water, on an empty stomach, and do not take anything else by mouth or lie down for the next 30 min. Patient take on Sunday      . amiodarone (PACERONE) 200 MG tablet Take 1 tablet (200 mg total) by mouth 2 (two) times daily.  180 tablet  2  . amLODipine-valsartan (EXFORGE) 5-160 MG per tablet Take 1 tablet by mouth daily.  90 tablet  3  . aspirin EC 81 MG tablet Take 81 mg by mouth daily.        . calcium-vitamin D (OSCAL) 250-125 MG-UNIT per tablet Take 1 tablet by mouth 2 (two) times daily.        . clopidogrel (PLAVIX) 75 MG tablet Take 1 tablet (75 mg total) by mouth every morning.  90 tablet  3  . colesevelam (WELCHOL) 625 MG tablet Take 1,875 mg by mouth 2 (two) times daily with a meal.      . ferrous sulfate 325 (65 FE) MG EC tablet Take 325 mg by mouth 2 (two) times daily.      . fish oil-omega-3 fatty acids 1000 MG capsule Take 1 g by mouth daily.       Marland Kitchen HYDROcodone-acetaminophen (NORCO) 5-325 MG per tablet Take 1 tablet by mouth every 6 (six) hours as needed. pain      . Iron-FA-B Cmp-C-Biot-Probiotic (FUSION PLUS) CAPS Take 1 capsule by mouth daily.      . magnesium oxide (MAG-OX) 400 MG tablet Take 400 mg by mouth daily.      . meloxicam (MOBIC) 15 MG tablet Take 15 mg by mouth every morning.        Marland Kitchen  metoprolol tartrate (LOPRESSOR) 25 MG tablet Take 3 tablets (75 mg total) by mouth 2 (two) times daily.  270 tablet  3  . Multiple Vitamin (MULTIVITAMIN) capsule Take 1 capsule by mouth daily.        . pravastatin (PRAVACHOL) 40 MG tablet Take 1 tablet (40 mg total) by mouth at bedtime.  90 tablet  3  . warfarin (COUMADIN) 4 MG tablet Take 4 mg by mouth See admin instructions. Take one tablet (4mg ) by mouth on Sundays and Thursdays. Take one half tablet (2mg ) on all other days       No current facility-administered medications for this visit.    Allergies  Iodine; Iohexol; and Tape  Electrocardiogram:  SR rate 64 PR 260  [facility]  Assessment and Plan

## 2012-06-11 ENCOUNTER — Encounter: Payer: Self-pay | Admitting: Cardiovascular Disease

## 2012-06-11 DIAGNOSIS — I4891 Unspecified atrial fibrillation: Secondary | ICD-10-CM | POA: Diagnosis not present

## 2012-06-11 DIAGNOSIS — D649 Anemia, unspecified: Secondary | ICD-10-CM | POA: Diagnosis not present

## 2012-06-11 DIAGNOSIS — E782 Mixed hyperlipidemia: Secondary | ICD-10-CM | POA: Diagnosis not present

## 2012-06-11 DIAGNOSIS — I10 Essential (primary) hypertension: Secondary | ICD-10-CM | POA: Diagnosis not present

## 2012-06-11 DIAGNOSIS — J069 Acute upper respiratory infection, unspecified: Secondary | ICD-10-CM | POA: Diagnosis not present

## 2012-06-11 DIAGNOSIS — I2510 Atherosclerotic heart disease of native coronary artery without angina pectoris: Secondary | ICD-10-CM | POA: Diagnosis not present

## 2012-06-11 DIAGNOSIS — E785 Hyperlipidemia, unspecified: Secondary | ICD-10-CM | POA: Diagnosis not present

## 2012-06-16 ENCOUNTER — Ambulatory Visit (INDEPENDENT_AMBULATORY_CARE_PROVIDER_SITE_OTHER): Payer: Medicare Other | Admitting: *Deleted

## 2012-06-16 DIAGNOSIS — Z7901 Long term (current) use of anticoagulants: Secondary | ICD-10-CM | POA: Diagnosis not present

## 2012-06-16 DIAGNOSIS — I4891 Unspecified atrial fibrillation: Secondary | ICD-10-CM | POA: Diagnosis not present

## 2012-06-17 DIAGNOSIS — M47817 Spondylosis without myelopathy or radiculopathy, lumbosacral region: Secondary | ICD-10-CM | POA: Diagnosis not present

## 2012-06-17 DIAGNOSIS — G894 Chronic pain syndrome: Secondary | ICD-10-CM | POA: Diagnosis not present

## 2012-06-17 DIAGNOSIS — M79609 Pain in unspecified limb: Secondary | ICD-10-CM | POA: Diagnosis not present

## 2012-06-17 DIAGNOSIS — M545 Low back pain: Secondary | ICD-10-CM | POA: Diagnosis not present

## 2012-06-26 ENCOUNTER — Ambulatory Visit (INDEPENDENT_AMBULATORY_CARE_PROVIDER_SITE_OTHER): Payer: Medicare Other | Admitting: *Deleted

## 2012-06-26 DIAGNOSIS — Z7901 Long term (current) use of anticoagulants: Secondary | ICD-10-CM | POA: Diagnosis not present

## 2012-06-26 DIAGNOSIS — I4891 Unspecified atrial fibrillation: Secondary | ICD-10-CM

## 2012-07-07 ENCOUNTER — Other Ambulatory Visit (INDEPENDENT_AMBULATORY_CARE_PROVIDER_SITE_OTHER): Payer: Medicare Other | Admitting: Vascular Surgery

## 2012-07-07 ENCOUNTER — Ambulatory Visit: Payer: Medicare Other | Admitting: Neurosurgery

## 2012-07-07 DIAGNOSIS — I6529 Occlusion and stenosis of unspecified carotid artery: Secondary | ICD-10-CM

## 2012-07-08 ENCOUNTER — Other Ambulatory Visit: Payer: Self-pay

## 2012-07-08 ENCOUNTER — Encounter: Payer: Self-pay | Admitting: Surgery

## 2012-07-09 ENCOUNTER — Ambulatory Visit (INDEPENDENT_AMBULATORY_CARE_PROVIDER_SITE_OTHER): Payer: Medicare Other | Admitting: *Deleted

## 2012-07-09 DIAGNOSIS — Z7901 Long term (current) use of anticoagulants: Secondary | ICD-10-CM | POA: Diagnosis not present

## 2012-07-09 DIAGNOSIS — I4891 Unspecified atrial fibrillation: Secondary | ICD-10-CM | POA: Diagnosis not present

## 2012-07-14 DIAGNOSIS — I10 Essential (primary) hypertension: Secondary | ICD-10-CM | POA: Diagnosis not present

## 2012-07-14 DIAGNOSIS — M545 Low back pain: Secondary | ICD-10-CM | POA: Diagnosis not present

## 2012-07-31 ENCOUNTER — Ambulatory Visit (INDEPENDENT_AMBULATORY_CARE_PROVIDER_SITE_OTHER): Payer: Medicare Other | Admitting: *Deleted

## 2012-07-31 DIAGNOSIS — Z7901 Long term (current) use of anticoagulants: Secondary | ICD-10-CM

## 2012-07-31 DIAGNOSIS — I4891 Unspecified atrial fibrillation: Secondary | ICD-10-CM | POA: Diagnosis not present

## 2012-08-18 DIAGNOSIS — M47817 Spondylosis without myelopathy or radiculopathy, lumbosacral region: Secondary | ICD-10-CM | POA: Diagnosis not present

## 2012-08-18 DIAGNOSIS — M79609 Pain in unspecified limb: Secondary | ICD-10-CM | POA: Diagnosis not present

## 2012-08-18 DIAGNOSIS — M545 Low back pain: Secondary | ICD-10-CM | POA: Diagnosis not present

## 2012-08-18 DIAGNOSIS — G894 Chronic pain syndrome: Secondary | ICD-10-CM | POA: Diagnosis not present

## 2012-08-21 ENCOUNTER — Encounter: Payer: Medicare Other | Admitting: *Deleted

## 2012-08-25 ENCOUNTER — Ambulatory Visit (INDEPENDENT_AMBULATORY_CARE_PROVIDER_SITE_OTHER): Payer: Medicare Other | Admitting: *Deleted

## 2012-08-25 DIAGNOSIS — Z7901 Long term (current) use of anticoagulants: Secondary | ICD-10-CM

## 2012-08-25 DIAGNOSIS — I4891 Unspecified atrial fibrillation: Secondary | ICD-10-CM

## 2012-08-26 DIAGNOSIS — G894 Chronic pain syndrome: Secondary | ICD-10-CM | POA: Diagnosis not present

## 2012-08-26 DIAGNOSIS — M47817 Spondylosis without myelopathy or radiculopathy, lumbosacral region: Secondary | ICD-10-CM | POA: Diagnosis not present

## 2012-08-26 DIAGNOSIS — M545 Low back pain: Secondary | ICD-10-CM | POA: Diagnosis not present

## 2012-09-03 ENCOUNTER — Ambulatory Visit (INDEPENDENT_AMBULATORY_CARE_PROVIDER_SITE_OTHER): Payer: Medicare Other | Admitting: *Deleted

## 2012-09-03 DIAGNOSIS — I4891 Unspecified atrial fibrillation: Secondary | ICD-10-CM | POA: Diagnosis not present

## 2012-09-03 DIAGNOSIS — Z7901 Long term (current) use of anticoagulants: Secondary | ICD-10-CM | POA: Diagnosis not present

## 2012-09-03 LAB — POCT INR: INR: 2

## 2012-09-24 ENCOUNTER — Ambulatory Visit (INDEPENDENT_AMBULATORY_CARE_PROVIDER_SITE_OTHER): Payer: Medicare Other | Admitting: *Deleted

## 2012-09-24 ENCOUNTER — Other Ambulatory Visit: Payer: Self-pay | Admitting: *Deleted

## 2012-09-24 DIAGNOSIS — I4891 Unspecified atrial fibrillation: Secondary | ICD-10-CM | POA: Diagnosis not present

## 2012-09-24 DIAGNOSIS — Z7901 Long term (current) use of anticoagulants: Secondary | ICD-10-CM | POA: Diagnosis not present

## 2012-09-24 LAB — POCT INR: INR: 2.2

## 2012-09-24 MED ORDER — AMIODARONE HCL 200 MG PO TABS: 200.0000 mg | ORAL_TABLET | Freq: Two times a day (BID) | ORAL | Status: DC

## 2012-10-13 ENCOUNTER — Telehealth: Payer: Self-pay | Admitting: *Deleted

## 2012-10-13 DIAGNOSIS — M47817 Spondylosis without myelopathy or radiculopathy, lumbosacral region: Secondary | ICD-10-CM | POA: Diagnosis not present

## 2012-10-13 DIAGNOSIS — G894 Chronic pain syndrome: Secondary | ICD-10-CM | POA: Diagnosis not present

## 2012-10-13 DIAGNOSIS — M79609 Pain in unspecified limb: Secondary | ICD-10-CM | POA: Diagnosis not present

## 2012-10-13 DIAGNOSIS — M545 Low back pain: Secondary | ICD-10-CM | POA: Diagnosis not present

## 2012-10-13 NOTE — Telephone Encounter (Signed)
SPOKE WITH PT'S HUSBAND  WELCHOL  CONTINUES TO RISE  IN PRICE WOULD LIKE TO CHANGE TO CHEAPER MED WILL DISCUSS WITH DR NISHAN./CY

## 2012-10-13 NOTE — Telephone Encounter (Signed)
Pt is calling to know if she could get a Generic for her Welchol due to price. Pt states she heard something about a Generic coming out. She states she has to now start paying $500+ for this medication. [phone]. Informed pt that Dr. Fabio Bering nurse will call her back.

## 2012-10-16 DIAGNOSIS — E785 Hyperlipidemia, unspecified: Secondary | ICD-10-CM | POA: Diagnosis not present

## 2012-10-16 DIAGNOSIS — M810 Age-related osteoporosis without current pathological fracture: Secondary | ICD-10-CM | POA: Diagnosis not present

## 2012-10-16 DIAGNOSIS — I10 Essential (primary) hypertension: Secondary | ICD-10-CM | POA: Diagnosis not present

## 2012-10-16 DIAGNOSIS — I4891 Unspecified atrial fibrillation: Secondary | ICD-10-CM | POA: Diagnosis not present

## 2012-10-16 NOTE — Telephone Encounter (Signed)
Dont know if there is a generic equivalent to welcohl.  Will forward to [facility] to switch if there is

## 2012-10-20 ENCOUNTER — Ambulatory Visit (INDEPENDENT_AMBULATORY_CARE_PROVIDER_SITE_OTHER): Payer: Medicare Other | Admitting: *Deleted

## 2012-10-20 DIAGNOSIS — I4891 Unspecified atrial fibrillation: Secondary | ICD-10-CM | POA: Diagnosis not present

## 2012-10-20 DIAGNOSIS — Z7901 Long term (current) use of anticoagulants: Secondary | ICD-10-CM

## 2012-10-21 ENCOUNTER — Other Ambulatory Visit: Payer: Self-pay | Admitting: *Deleted

## 2012-10-21 DIAGNOSIS — M47817 Spondylosis without myelopathy or radiculopathy, lumbosacral region: Secondary | ICD-10-CM | POA: Diagnosis not present

## 2012-10-21 DIAGNOSIS — M545 Low back pain: Secondary | ICD-10-CM | POA: Diagnosis not present

## 2012-10-21 DIAGNOSIS — G894 Chronic pain syndrome: Secondary | ICD-10-CM | POA: Diagnosis not present

## 2012-10-21 DIAGNOSIS — M79609 Pain in unspecified limb: Secondary | ICD-10-CM | POA: Diagnosis not present

## 2012-10-21 MED ORDER — COLESEVELAM HCL 625 MG PO TABS: 1875.0000 mg | ORAL_TABLET | Freq: Two times a day (BID) | ORAL | Status: DC

## 2012-10-21 MED ORDER — METOPROLOL TARTRATE 25 MG PO TABS: 75.0000 mg | ORAL_TABLET | Freq: Two times a day (BID) | ORAL | Status: DC

## 2012-10-21 NOTE — Telephone Encounter (Signed)
LMTCB ./CY 

## 2012-10-21 NOTE — Telephone Encounter (Signed)
WHILE  GIVING INFO  ON MED CHANGES PT'S HUSBAND   STATES PT HAS HELD COUMADIN SINCE  10-15-12 HAD  BRANCH  BLOCK DONE TODAY  WITH DR Eduard Clos   DID NOT KNOW  THAT THIS PROCEDURES WAS A TWO PART NEEDING TO  DO SECOND PROCEDURE NEXT WEEKS PT NEEDS TO KNOW IF OKAY TO PROCEED AND TO CONTINUE TO HOLD  COUMADIN FOR ANOTHER   6 DAYS  OR  SKIP  A WEEK  AND SCHEDULE SECOND  PROCEDURE  WEEK AFTER NEXT   INR ON 10-20-12  WAS  1.1 WILL FORWARD TO DR Eden Emms FOR REVIEW ./CY      PT  AWARE OF RECOMMENDATIONS RE MED CHANGE ALSO LEFT SAMPLES OF WELCHOL AT  FRONT DESK PT TO CALL  BACK  WHEN MED GETS LOW  AND NEEDS NEW MED FILLED .Zack Seal

## 2012-10-21 NOTE — Telephone Encounter (Signed)
Pt could try cholestyramine 4gm daily as a cheaper alternative.  Also have samples available in office for patient.

## 2012-10-21 NOTE — Telephone Encounter (Signed)
Just keep holding coumadin

## 2012-10-21 NOTE — Telephone Encounter (Signed)
PT'S HUSBAND  AWARE MAY CONTINUE TO HOLD .Zack Seal

## 2012-10-24 DIAGNOSIS — I4891 Unspecified atrial fibrillation: Secondary | ICD-10-CM | POA: Diagnosis not present

## 2012-10-24 DIAGNOSIS — E785 Hyperlipidemia, unspecified: Secondary | ICD-10-CM | POA: Diagnosis not present

## 2012-10-28 ENCOUNTER — Telehealth: Payer: Self-pay | Admitting: *Deleted

## 2012-10-28 DIAGNOSIS — M79609 Pain in unspecified limb: Secondary | ICD-10-CM | POA: Diagnosis not present

## 2012-10-28 DIAGNOSIS — M545 Low back pain: Secondary | ICD-10-CM | POA: Diagnosis not present

## 2012-10-28 DIAGNOSIS — M47817 Spondylosis without myelopathy or radiculopathy, lumbosacral region: Secondary | ICD-10-CM | POA: Diagnosis not present

## 2012-10-28 DIAGNOSIS — G894 Chronic pain syndrome: Secondary | ICD-10-CM | POA: Diagnosis not present

## 2012-10-28 NOTE — Telephone Encounter (Signed)
Patient would like to discuss coumadin directions / tgs

## 2012-10-28 NOTE — Telephone Encounter (Signed)
Spoke with pt's husband.  Pt finished injections today.  Wanted to make sure it was okay to restart Coumadin.  Instructed okay to restart.  Reinforced directions given at last visit to take 1 tablet x 3 days then resume previous dose.  Pt has f/u appt on 7/28.

## 2012-10-30 ENCOUNTER — Other Ambulatory Visit: Payer: Self-pay | Admitting: *Deleted

## 2012-10-30 MED ORDER — METOPROLOL TARTRATE 25 MG PO TABS: 75.0000 mg | ORAL_TABLET | Freq: Two times a day (BID) | ORAL | Status: DC

## 2012-11-03 ENCOUNTER — Ambulatory Visit (INDEPENDENT_AMBULATORY_CARE_PROVIDER_SITE_OTHER): Payer: Medicare Other | Admitting: *Deleted

## 2012-11-03 DIAGNOSIS — Z7901 Long term (current) use of anticoagulants: Secondary | ICD-10-CM

## 2012-11-03 DIAGNOSIS — I4891 Unspecified atrial fibrillation: Secondary | ICD-10-CM | POA: Diagnosis not present

## 2012-11-03 LAB — POCT INR: INR: 1.7

## 2012-11-17 ENCOUNTER — Ambulatory Visit (INDEPENDENT_AMBULATORY_CARE_PROVIDER_SITE_OTHER): Payer: Medicare Other | Admitting: *Deleted

## 2012-11-17 DIAGNOSIS — Z7901 Long term (current) use of anticoagulants: Secondary | ICD-10-CM | POA: Diagnosis not present

## 2012-11-17 DIAGNOSIS — I4891 Unspecified atrial fibrillation: Secondary | ICD-10-CM

## 2012-11-17 LAB — POCT INR: INR: 3.2

## 2012-11-28 DIAGNOSIS — E785 Hyperlipidemia, unspecified: Secondary | ICD-10-CM | POA: Diagnosis not present

## 2012-11-28 DIAGNOSIS — I10 Essential (primary) hypertension: Secondary | ICD-10-CM | POA: Diagnosis not present

## 2012-11-28 DIAGNOSIS — Z0000 Encounter for general adult medical examination without abnormal findings: Secondary | ICD-10-CM | POA: Diagnosis not present

## 2012-11-28 DIAGNOSIS — I4891 Unspecified atrial fibrillation: Secondary | ICD-10-CM | POA: Diagnosis not present

## 2012-12-11 ENCOUNTER — Ambulatory Visit (INDEPENDENT_AMBULATORY_CARE_PROVIDER_SITE_OTHER): Payer: Medicare Other | Admitting: *Deleted

## 2012-12-11 DIAGNOSIS — I4891 Unspecified atrial fibrillation: Secondary | ICD-10-CM | POA: Diagnosis not present

## 2012-12-11 DIAGNOSIS — Z7901 Long term (current) use of anticoagulants: Secondary | ICD-10-CM | POA: Diagnosis not present

## 2012-12-11 LAB — POCT INR: INR: 2.1

## 2012-12-22 ENCOUNTER — Ambulatory Visit (INDEPENDENT_AMBULATORY_CARE_PROVIDER_SITE_OTHER): Payer: Medicare Other | Admitting: Cardiovascular Disease

## 2012-12-22 ENCOUNTER — Encounter: Payer: Self-pay | Admitting: Cardiovascular Disease

## 2012-12-22 DIAGNOSIS — I6529 Occlusion and stenosis of unspecified carotid artery: Secondary | ICD-10-CM

## 2012-12-22 DIAGNOSIS — E785 Hyperlipidemia, unspecified: Secondary | ICD-10-CM

## 2012-12-22 DIAGNOSIS — I4891 Unspecified atrial fibrillation: Secondary | ICD-10-CM | POA: Diagnosis not present

## 2012-12-22 DIAGNOSIS — I10 Essential (primary) hypertension: Secondary | ICD-10-CM | POA: Diagnosis not present

## 2012-12-22 NOTE — Assessment & Plan Note (Signed)
Well controlled.  Continue current medications and low sodium Dash type diet.    

## 2012-12-22 NOTE — Assessment & Plan Note (Signed)
F/U with Dr Myra Gianotti  Has duplex at [facility] 01/10/13  Concern about poor flow in RICA due to innominate occlusion. She has dizzyness with rapid standing that is probably related to this

## 2012-12-22 NOTE — Patient Instructions (Signed)
Your physician wants you to follow-up in:  6 MONTHS WITH DR NISHAN  You will receive a reminder letter in the mail two months in advance. If you don't receive a letter, please call our office to schedule the follow-up appointment. Your physician recommends that you continue on your current medications as directed. Please refer to the Current Medication list given to you today. 

## 2012-12-22 NOTE — Assessment & Plan Note (Signed)
Maint NSR f/u [place] for INR Has been Rx

## 2012-12-22 NOTE — Progress Notes (Signed)
Patient ID: [name], female   DOB: 08-18-1940, 72 y.o.   MRN: [id number] [name] is seen for F/U of PAF, bruit, right subclavian steal and anticoagulatin She has known vascular disease primarily in the sublavian and carotids. She was admitted with syncope last year . She had PAF with diastolic heart failure. Her cath showed only diagonal disease with no critical major vessel disease. EF was normal. 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 She has 60-79% stenosis on left ICA. In combination with rapid atrial fibrillation this may have contributed to a brief loss of consdousness. Her diastolic CHF cleared with restoration of NSR. She was sent home on low dose amiodarone. She has had no SOB, or palpitations since being home. there have been no bleeding problems.   Reviewed duplex from [facility]  07/07/12    60-79% LICA stenosis. Innominate right occlusion with steal  ROS: Denies fever, malais, weight loss, blurry vision, decreased visual acuity, cough, sputum, SOB, hemoptysis, pleuritic pain, palpitaitons, heartburn, abdominal pain, melena, lower extremity edema, claudication, or rash.  All other systems reviewed and negative  General: Affect appropriate Chronically ill white female HEENT: normal Neck supple with no adenopathy JVP normal bilataeral bruits no thyromegaly Lungs clear with no wheezing and good diaphragmatic motion Heart:  S1/S2 no murmur, no rub, gallop or click PMI normal Abdomen: benighn, BS positve, no tenderness, no AAA no bruit.  No HSM or HJR Decreased right brachial and radial pulse No edema Neuro non-focal Skin warm and dry No muscular weakness   Current Outpatient Prescriptions  Medication Sig Dispense Refill  . alendronate (FOSAMAX) 70 MG tablet Take 70 mg by mouth every 7 (seven) days. Take in the morning with a full glass of water, on an empty stomach, and do not take anything else by mouth or lie  down for the next 30 min. Patient take on Sunday      . amiodarone (PACERONE) 200 MG tablet Take 1 tablet (200 mg total) by mouth 2 (two) times daily.  180 tablet  2  . amLODipine-valsartan (EXFORGE) 5-160 MG per tablet Take 1 tablet by mouth daily.  90 tablet  3  . aspirin EC 81 MG tablet Take 81 mg by mouth daily.        . calcium-vitamin D (OSCAL) 250-125 MG-UNIT per tablet Take 1 tablet by mouth 2 (two) times daily.        . clopidogrel (PLAVIX) 75 MG tablet Take 1 tablet (75 mg total) by mouth every morning.  90 tablet  3  . colesevelam (WELCHOL) 625 MG tablet Take 3 tablets (1,875 mg total) by mouth 2 (two) times daily with a meal.  540 tablet  1  . ferrous sulfate 325 (65 FE) MG EC tablet Take 325 mg by mouth 2 (two) times daily.      . fish oil-omega-3 fatty acids 1000 MG capsule Take 1 g by mouth daily.       Marland Kitchen HYDROcodone-acetaminophen (NORCO) 5-325 MG per tablet Take 1 tablet by mouth every 6 (six) hours as needed. pain      . Iron-FA-B Cmp-C-Biot-Probiotic (FUSION PLUS) CAPS Take 1 capsule by mouth daily.      . magnesium oxide (MAG-OX) 400 MG tablet Take 400 mg by mouth daily.      . meloxicam (MOBIC) 15 MG tablet Take 15 mg by mouth every morning.        . metoprolol tartrate (  LOPRESSOR) 25 MG tablet Take 3 tablets (75 mg total) by mouth 2 (two) times daily.  540 tablet  3  . Multiple Vitamin (MULTIVITAMIN) capsule Take 1 capsule by mouth daily.        . pravastatin (PRAVACHOL) 40 MG tablet Take 1 tablet (40 mg total) by mouth at bedtime.  90 tablet  3  . warfarin (COUMADIN) 4 MG tablet Take 4 mg by mouth See admin instructions. Take one tablet (4mg ) by mouth on Sundays and Thursdays. Take one half tablet (2mg ) on all other days       No current facility-administered medications for this visit.    Allergies  Iodine; Iohexol; and Tape  Electrocardiogram:  06/06/12  SR rate 64 LVH  PR 260 msec  Assessment and Plan

## 2012-12-22 NOTE — Assessment & Plan Note (Signed)
Cholesterol is at goal.  Continue current dose of statin and diet Rx.  No myalgias or side effects.  F/U  LFT's in 6 months. Lab Results  Component Value Date   LDLCALC 60 08/11/2010

## 2012-12-28 ENCOUNTER — Other Ambulatory Visit: Payer: Self-pay | Admitting: Cardiovascular Disease

## 2012-12-31 DIAGNOSIS — Z23 Encounter for immunization: Secondary | ICD-10-CM | POA: Diagnosis not present

## 2013-01-08 ENCOUNTER — Ambulatory Visit (INDEPENDENT_AMBULATORY_CARE_PROVIDER_SITE_OTHER): Payer: Medicare Other | Admitting: *Deleted

## 2013-01-08 DIAGNOSIS — I4891 Unspecified atrial fibrillation: Secondary | ICD-10-CM | POA: Diagnosis not present

## 2013-01-08 DIAGNOSIS — Z7901 Long term (current) use of anticoagulants: Secondary | ICD-10-CM | POA: Diagnosis not present

## 2013-01-12 ENCOUNTER — Ambulatory Visit (HOSPITAL_COMMUNITY)
Admission: RE | Admit: 2013-01-12 | Discharge: 2013-01-12 | Disposition: A | Discharge status: Home / Self Care | Payer: Medicare Other | Source: Ambulatory Visit | Attending: Surgery | Admitting: Surgery

## 2013-01-12 ENCOUNTER — Ambulatory Visit (INDEPENDENT_AMBULATORY_CARE_PROVIDER_SITE_OTHER): Payer: Medicare Other | Admitting: Family

## 2013-01-12 ENCOUNTER — Encounter: Payer: Self-pay | Admitting: Family

## 2013-01-12 DIAGNOSIS — R42 Dizziness and giddiness: Secondary | ICD-10-CM | POA: Diagnosis not present

## 2013-01-12 DIAGNOSIS — I6529 Occlusion and stenosis of unspecified carotid artery: Secondary | ICD-10-CM

## 2013-01-12 NOTE — Progress Notes (Signed)
Pt. C/o dizziness with position change.  States "going on for a long time."

## 2013-01-12 NOTE — Progress Notes (Signed)
Established Carotid Patient  History of Present Illness  [name] is a 72 y.o. female patient of Dr. Myra Gianotti with no history of carotid intervention comes in for carotid duplex surveillance, has known right innominate artery occlusion. Patient has orthostatic hypotension if stands up quickly, but denies dizziness otherwise. Patient denies history of amaurosis fugax, denies hemiplegia. Had stroke in May, 2008, manifested by dysphagia and aphasia which have resolved. Has hx of atrial fib., followed by Dr. Eden Emms.  Patient has had a loose cough for a week, had cold symptoms the week prior.  The patient's previous neurologic status is Unchanged.   Reports New Medical or Surgical History: ESI's earlier this year for spinal stenosis and scoliosis, OA of the spine, DDD, and sciatic pain that is intermittent.  Pt Diabetic: No Pt smoker: former smoker, quit 2008  Pt meds include: Statin : Yes Betablocker: yes ASA: Yes Other anticoagulants/antiplatelets: coumadin, for atrial fib.   Past Medical History  Diagnosis Date  . Subclavian steal syndrome   . Carotid bruit     LICA 60-79% (duplex 9/11)  . Hypertension   . Hyperlipidemia   . Atrial fibrillation   . Anticoagulation management encounter   . Meningioma   . Osteoporosis   . Stroke   . Pneumonia   . Breast cancer     S/P left mastectomy    Social History History  Substance Use Topics  . Smoking status: Former Smoker -- 1.00 packs/day for 56 years    Types: Cigarettes    Quit date: 02/14/2007  . Smokeless tobacco: Never Used     Comment: x 4 yrs.  . Alcohol Use: Yes     Comment: occasional alcohol    Family History Family History  Problem Relation Age of Onset  . Alzheimer's disease Mother   . Dementia Mother   . Parkinsonism Father     Surgical History Past Surgical History  Procedure Laterality Date  . Cardiac catheterization    . Mastectomy      left in 1990  . Cataract surgery      recently  .  Colonoscopy  11/17/2010    Procedure: COLONOSCOPY;  Surgeon: Malissa Hippo, MD;  Location: [facility];  Service: Endoscopy;  Laterality: N/A;  10:45 am  . Esophagogastroduodenoscopy  11/17/2010    Procedure: ESOPHAGOGASTRODUODENOSCOPY (EGD);  Surgeon: Malissa Hippo, MD;  Location: [facility];  Service: Endoscopy;  Laterality: N/A;  . Mastectomy      Allergies  Allergen Reactions  . Iodine Rash and Other (See Comments)    BETADINE Rash/burning, blisters on skin.  . Iohexol Rash and Other (See Comments)    Blisters; PT NEEDS 13-HOUR PREP   . Tape Itching and Rash    PAPER TAPE    Current Outpatient Prescriptions  Medication Sig Dispense Refill  . acetaminophen (TYLENOL) 500 MG tablet Take 500 mg by mouth every 6 (six) hours as needed for pain.      Marland Kitchen alendronate (FOSAMAX) 70 MG tablet Take 70 mg by mouth every 7 (seven) days. Take in the morning with a full glass of water, on an empty stomach, and do not take anything else by mouth or lie down for the next 30 min. Patient take on Sunday      . amiodarone (PACERONE) 200 MG tablet Take 1 tablet (200 mg total) by mouth 2 (two) times daily.  180 tablet  2  . amLODipine-valsartan (EXFORGE) 5-160 MG per tablet Take 1 tablet by mouth  daily.  90 tablet  3  . aspirin EC 81 MG tablet Take 81 mg by mouth daily.        . calcium-vitamin D (OSCAL) 250-125 MG-UNIT per tablet Take 1 tablet by mouth 2 (two) times daily.        . clopidogrel (PLAVIX) 75 MG tablet Take 1 tablet (75 mg total) by mouth every morning.  90 tablet  3  . colesevelam (WELCHOL) 625 MG tablet Take 3 tablets (1,875 mg total) by mouth 2 (two) times daily with a meal.  540 tablet  1  . ferrous sulfate 325 (65 FE) MG EC tablet Take 325 mg by mouth 2 (two) times daily.      . fish oil-omega-3 fatty acids 1000 MG capsule Take 1 g by mouth daily.       Marland Kitchen HYDROcodone-acetaminophen (NORCO) 5-325 MG per tablet Take 1 tablet by mouth every 6 (six) hours as needed. pain      .  Iron-FA-B Cmp-C-Biot-Probiotic (FUSION PLUS) CAPS Take 1 capsule by mouth daily.      . magnesium oxide (MAG-OX) 400 MG tablet Take 400 mg by mouth daily.      . meloxicam (MOBIC) 15 MG tablet Take 15 mg by mouth every morning.        . metoprolol tartrate (LOPRESSOR) 25 MG tablet Take 3 tablets (75 mg total) by mouth 2 (two) times daily.  540 tablet  3  . Multiple Vitamin (MULTIVITAMIN) capsule Take 1 capsule by mouth daily.        . pravastatin (PRAVACHOL) 40 MG tablet Take 1 tablet by mouth  every night at bedtime  90 tablet  1  . warfarin (COUMADIN) 4 MG tablet Take 4 mg by mouth See admin instructions. Take one tablet (4mg ) by mouth on Sundays and Thursdays. Take one half tablet (2mg ) on all other days       No current facility-administered medications for this visit.    Review of Systems : [x]  Positive   [ ]  Denies  General:[ ]  Weight loss,  [ ]  Weight gain, [ ]  Loss of appetite, [ ]  Fever, [ ]  chills  Neurologic: [ ]  Dizziness, [ ]  Blackouts, [ ]  Headaches, [ ]  Seizure [ ]  Stroke, [ ]  "Mini stroke", [ ]  Slurred speech, [ ]  Temporary blindness;  [ ] weakness,  Ear/Nose/Throat: [ ]  Change in hearing, [ ]  Nose bleeds, [ ]  Hoarseness  Vascular:[ ]  Pain in legs with walking, [ ]  Pain in feet while lying flat , [ ]   Non-healing ulcer, [ ]  Blood clot in vein,    Pulmonary: [ ]  Home oxygen, [ ]   Productive cough, [ ]  Bronchitis, [ ]  Coughing up blood,  [ ]  Asthma, [ ]  Wheezing  Musculoskeletal:  [ ]  Arthritis, [ ]  Joint pain, [ ]  low back pain  Cardiac: [ ]  Chest pain, [ ]  Shortness of breath when lying flat, [ ]  Shortness of breath with exertion, [ ]  Palpitations, [ ]  Heart murmur, [ ]   Atrial fibrillation  Hematologic:[ ]  Easy Bruising, [ ]  Anemia; [ ]  Hepatitis  Psychiatric: [ ]   Depression, [ ]  Anxiety   Gastrointestinal: [ ]  Black stool, [ ]  Blood in stool, [ ]  Peptic ulcer disease,  [ ]  Gastroesophageal Reflux, [ ]  Trouble swallowing, [ ]  Diarrhea, [ ]  Constipation  Urinary: [ ]   chronic Kidney disease, [ ]  on HD, [ ]  Burning with urination, [ ]  Frequent urination, [ ]  Difficulty urinating;   Skin: [ ]   Rashes, [ ]  Wounds    Physical Examination  Filed Vitals:   01/12/13 1536  BP: 156/84  Pulse: 66  Resp: 16   Filed Weights   01/12/13 1536  Weight: 116 lb (52.617 kg)   Body mass index is 19 kg/(m^2).  General: WDWN female in NAD GAIT: normal Eyes: PERRLA Pulmonary:  Clear in left posterior and anterior fields, Positive  Rales in right posterior  fields, Positive rhonchi in right posterior fileds, & Negative wheezing. Loose cough.  Cardiac: regular Rhythm.  VASCULAR EXAM Carotid Bruits Left Right   Positive Positive    Aorta is not palpable. Radial pulses are 2+ palpable and  equal.                                                                                                                            LE Pulses LEFT RIGHT       FEMORAL   palpable   palpable        POPLITEAL  not palpable    palpable       POSTERIOR TIBIAL   palpable    palpable        DORSALIS PEDIS      ANTERIOR TIBIAL  palpable   palpable     Gastrointestinal: soft, nontender, BS WNL, no r/g,  negative masses.  Musculoskeletal: Negative muscle atrophy/wasting. M/S 4/5 throughout, Extremities without ischemic changes.  Legs and arms have multiple bruises secondary to coumadin use.  Neurologic: A&O X 3; Appropriate Affect ; SENSATION ;normal;  Speech is normal CN 2-12 intact, Pain and light touch intact in extremities, Motor exam as listed above.   Non-Invasive Vascular Imaging CAROTID DUPLEX 01/12/2013   Right ICA: flow noted below baseline with <40% stenosis; known right innominate occlusion with monophasic flow throughout the right carotid system. Left ICA: 60 - 79 % stenosis, may be due to compensatory flow. Retrograde right vertebral flow, antegrade on left.  These findings are Unchanged from previous exam 6 months earlier.  Assessment: AIREONNA BAUER is a  72 y.o. female who presents with asymptomatic right ICA with flow noted below baseline and <40% stenosis; known right innominate occlusion with monophasic flow throughout the right carotid system. Left ICA: 60 - 79 % stenosis, may be due to compensatory flow. Retrograde right vertebral flow, antegrade on left. The  ICA stenosis is  Unchanged from previous exam 6 months prior. She has had a loose cough for about a week which is followed by cold symptoms the week prior. Rales, rhonchi, and decreased breath sounds auscultated in right posterior fields. Patient advised to notify her PCP about this ASAP so that he may evaluate her.  Will send this progress note to her PCP today.   Plan: Based on today's exam and Duplex results, and after discussing with Dr. Hart Rochester, advised patient to follow-up in 6 months with Carotid Duplex scan.   I discussed in depth with the patient the nature of atherosclerosis, and  emphasized the importance of maximal medical management including strict control of blood pressure, blood glucose, and lipid levels, obtaining regular exercise, and continued cessation of smoking.  The patient is aware that without maximal medical management the underlying atherosclerotic disease process will progress, limiting the benefit of any interventions. The patient was given information about stroke prevention and what symptoms should prompt the patient to seek immediate medical care. Thank you for allowing [place] to participate in this patient's care.  Colleen March, RN, MSN, FNP-C [facility] Office: [phone]  Clinic Physician: Hart Rochester  01/12/2013 2:34 PM

## 2013-01-13 ENCOUNTER — Other Ambulatory Visit (HOSPITAL_COMMUNITY): Payer: Self-pay | Admitting: Internal Medicine

## 2013-01-13 ENCOUNTER — Ambulatory Visit (HOSPITAL_COMMUNITY)
Admission: RE | Admit: 2013-01-13 | Discharge: 2013-01-13 | Disposition: A | Discharge status: Home / Self Care | Payer: Medicare Other | Source: Ambulatory Visit | Attending: Internal Medicine | Admitting: Internal Medicine

## 2013-01-13 DIAGNOSIS — C50919 Malignant neoplasm of unspecified site of unspecified female breast: Secondary | ICD-10-CM | POA: Diagnosis not present

## 2013-01-13 DIAGNOSIS — I10 Essential (primary) hypertension: Secondary | ICD-10-CM | POA: Diagnosis not present

## 2013-01-13 DIAGNOSIS — I700 Atherosclerosis of aorta: Secondary | ICD-10-CM | POA: Diagnosis not present

## 2013-01-13 DIAGNOSIS — R5381 Other malaise: Secondary | ICD-10-CM | POA: Insufficient documentation

## 2013-01-13 DIAGNOSIS — R05 Cough: Secondary | ICD-10-CM

## 2013-01-13 DIAGNOSIS — R5383 Other fatigue: Secondary | ICD-10-CM

## 2013-01-13 DIAGNOSIS — J1289 Other viral pneumonia: Secondary | ICD-10-CM | POA: Diagnosis not present

## 2013-01-13 DIAGNOSIS — I4891 Unspecified atrial fibrillation: Secondary | ICD-10-CM | POA: Insufficient documentation

## 2013-01-13 DIAGNOSIS — J449 Chronic obstructive pulmonary disease, unspecified: Secondary | ICD-10-CM | POA: Insufficient documentation

## 2013-01-13 DIAGNOSIS — J158 Pneumonia due to other specified bacteria: Secondary | ICD-10-CM | POA: Diagnosis not present

## 2013-01-13 DIAGNOSIS — J4489 Other specified chronic obstructive pulmonary disease: Secondary | ICD-10-CM | POA: Insufficient documentation

## 2013-01-13 NOTE — Addendum Note (Signed)
Addended by: Lorin Mercy K on: 01/13/2013 09:00 AM   Modules accepted: Orders

## 2013-02-05 ENCOUNTER — Ambulatory Visit (INDEPENDENT_AMBULATORY_CARE_PROVIDER_SITE_OTHER): Payer: Medicare Other | Admitting: *Deleted

## 2013-02-05 DIAGNOSIS — Z7901 Long term (current) use of anticoagulants: Secondary | ICD-10-CM

## 2013-02-05 DIAGNOSIS — I4891 Unspecified atrial fibrillation: Secondary | ICD-10-CM

## 2013-02-16 DIAGNOSIS — IMO0002 Reserved for concepts with insufficient information to code with codable children: Secondary | ICD-10-CM | POA: Diagnosis not present

## 2013-02-16 DIAGNOSIS — G894 Chronic pain syndrome: Secondary | ICD-10-CM | POA: Diagnosis not present

## 2013-02-16 DIAGNOSIS — M545 Low back pain: Secondary | ICD-10-CM | POA: Diagnosis not present

## 2013-02-16 DIAGNOSIS — M48062 Spinal stenosis, lumbar region with neurogenic claudication: Secondary | ICD-10-CM | POA: Diagnosis not present

## 2013-03-02 ENCOUNTER — Ambulatory Visit (INDEPENDENT_AMBULATORY_CARE_PROVIDER_SITE_OTHER): Payer: Medicare Other | Admitting: *Deleted

## 2013-03-02 DIAGNOSIS — I4891 Unspecified atrial fibrillation: Secondary | ICD-10-CM | POA: Diagnosis not present

## 2013-03-02 DIAGNOSIS — Z7901 Long term (current) use of anticoagulants: Secondary | ICD-10-CM | POA: Diagnosis not present

## 2013-03-02 LAB — POCT INR: INR: 1.1

## 2013-03-09 ENCOUNTER — Ambulatory Visit (INDEPENDENT_AMBULATORY_CARE_PROVIDER_SITE_OTHER): Payer: Medicare Other | Admitting: *Deleted

## 2013-03-09 DIAGNOSIS — I4891 Unspecified atrial fibrillation: Secondary | ICD-10-CM | POA: Diagnosis not present

## 2013-03-09 DIAGNOSIS — Z7901 Long term (current) use of anticoagulants: Secondary | ICD-10-CM

## 2013-03-09 LAB — POCT INR: INR: 2.7

## 2013-03-25 ENCOUNTER — Other Ambulatory Visit (HOSPITAL_COMMUNITY): Payer: Self-pay | Admitting: Internal Medicine

## 2013-03-25 DIAGNOSIS — Z139 Encounter for screening, unspecified: Secondary | ICD-10-CM

## 2013-04-09 DIAGNOSIS — L97929 Non-pressure chronic ulcer of unspecified part of left lower leg with unspecified severity: Secondary | ICD-10-CM

## 2013-04-09 DIAGNOSIS — M479 Spondylosis, unspecified: Secondary | ICD-10-CM

## 2013-04-09 DIAGNOSIS — L97919 Non-pressure chronic ulcer of unspecified part of right lower leg with unspecified severity: Secondary | ICD-10-CM

## 2013-04-09 HISTORY — DX: Non-pressure chronic ulcer of unspecified part of left lower leg with unspecified severity: L97.929

## 2013-04-09 HISTORY — DX: Non-pressure chronic ulcer of unspecified part of left lower leg with unspecified severity: L97.919

## 2013-04-09 HISTORY — DX: Spondylosis, unspecified: M47.9

## 2013-04-13 ENCOUNTER — Ambulatory Visit (INDEPENDENT_AMBULATORY_CARE_PROVIDER_SITE_OTHER): Payer: Medicare Other | Admitting: *Deleted

## 2013-04-13 DIAGNOSIS — Z5181 Encounter for therapeutic drug level monitoring: Secondary | ICD-10-CM

## 2013-04-13 DIAGNOSIS — I4891 Unspecified atrial fibrillation: Secondary | ICD-10-CM | POA: Diagnosis not present

## 2013-04-13 DIAGNOSIS — Z7901 Long term (current) use of anticoagulants: Secondary | ICD-10-CM | POA: Diagnosis not present

## 2013-04-13 LAB — POCT INR: INR: 1.5

## 2013-04-21 DIAGNOSIS — E785 Hyperlipidemia, unspecified: Secondary | ICD-10-CM | POA: Diagnosis not present

## 2013-04-21 DIAGNOSIS — E559 Vitamin D deficiency, unspecified: Secondary | ICD-10-CM | POA: Diagnosis not present

## 2013-04-21 DIAGNOSIS — D649 Anemia, unspecified: Secondary | ICD-10-CM | POA: Diagnosis not present

## 2013-04-21 DIAGNOSIS — I10 Essential (primary) hypertension: Secondary | ICD-10-CM | POA: Diagnosis not present

## 2013-04-27 ENCOUNTER — Ambulatory Visit (INDEPENDENT_AMBULATORY_CARE_PROVIDER_SITE_OTHER): Payer: Medicare Other | Admitting: *Deleted

## 2013-04-27 DIAGNOSIS — I4891 Unspecified atrial fibrillation: Secondary | ICD-10-CM | POA: Diagnosis not present

## 2013-04-27 DIAGNOSIS — Z7901 Long term (current) use of anticoagulants: Secondary | ICD-10-CM | POA: Diagnosis not present

## 2013-04-27 LAB — POCT INR: INR: 2.7

## 2013-04-28 ENCOUNTER — Ambulatory Visit (HOSPITAL_COMMUNITY)
Admission: RE | Admit: 2013-04-28 | Discharge: 2013-04-28 | Disposition: A | Discharge status: Home / Self Care | Payer: Medicare Other | Source: Ambulatory Visit | Attending: Internal Medicine | Admitting: Internal Medicine

## 2013-04-28 DIAGNOSIS — Z1231 Encounter for screening mammogram for malignant neoplasm of breast: Secondary | ICD-10-CM | POA: Insufficient documentation

## 2013-04-28 DIAGNOSIS — Z139 Encounter for screening, unspecified: Secondary | ICD-10-CM

## 2013-04-29 DIAGNOSIS — I4891 Unspecified atrial fibrillation: Secondary | ICD-10-CM | POA: Diagnosis not present

## 2013-04-29 DIAGNOSIS — R945 Abnormal results of liver function studies: Secondary | ICD-10-CM | POA: Diagnosis not present

## 2013-04-29 DIAGNOSIS — I10 Essential (primary) hypertension: Secondary | ICD-10-CM | POA: Diagnosis not present

## 2013-04-29 DIAGNOSIS — I2510 Atherosclerotic heart disease of native coronary artery without angina pectoris: Secondary | ICD-10-CM | POA: Diagnosis not present

## 2013-05-13 DIAGNOSIS — M545 Low back pain, unspecified: Secondary | ICD-10-CM | POA: Diagnosis not present

## 2013-05-13 DIAGNOSIS — I10 Essential (primary) hypertension: Secondary | ICD-10-CM | POA: Diagnosis not present

## 2013-05-18 ENCOUNTER — Ambulatory Visit (INDEPENDENT_AMBULATORY_CARE_PROVIDER_SITE_OTHER): Payer: Medicare Other | Admitting: *Deleted

## 2013-05-18 DIAGNOSIS — Z7901 Long term (current) use of anticoagulants: Secondary | ICD-10-CM | POA: Diagnosis not present

## 2013-05-18 DIAGNOSIS — Z5181 Encounter for therapeutic drug level monitoring: Secondary | ICD-10-CM

## 2013-05-18 DIAGNOSIS — I4891 Unspecified atrial fibrillation: Secondary | ICD-10-CM

## 2013-05-18 LAB — POCT INR: INR: 1.4

## 2013-05-19 DIAGNOSIS — G894 Chronic pain syndrome: Secondary | ICD-10-CM | POA: Diagnosis not present

## 2013-05-19 DIAGNOSIS — M545 Low back pain, unspecified: Secondary | ICD-10-CM | POA: Diagnosis not present

## 2013-05-19 DIAGNOSIS — M48062 Spinal stenosis, lumbar region with neurogenic claudication: Secondary | ICD-10-CM | POA: Diagnosis not present

## 2013-05-19 DIAGNOSIS — IMO0002 Reserved for concepts with insufficient information to code with codable children: Secondary | ICD-10-CM | POA: Diagnosis not present

## 2013-05-25 ENCOUNTER — Ambulatory Visit (INDEPENDENT_AMBULATORY_CARE_PROVIDER_SITE_OTHER): Payer: Medicare Other | Admitting: *Deleted

## 2013-05-25 DIAGNOSIS — Z5181 Encounter for therapeutic drug level monitoring: Secondary | ICD-10-CM

## 2013-05-25 DIAGNOSIS — I4891 Unspecified atrial fibrillation: Secondary | ICD-10-CM

## 2013-05-25 DIAGNOSIS — Z7901 Long term (current) use of anticoagulants: Secondary | ICD-10-CM

## 2013-05-25 LAB — POCT INR: INR: 2.6

## 2013-06-15 ENCOUNTER — Ambulatory Visit (INDEPENDENT_AMBULATORY_CARE_PROVIDER_SITE_OTHER): Payer: Medicare Other | Admitting: *Deleted

## 2013-06-15 DIAGNOSIS — Z7901 Long term (current) use of anticoagulants: Secondary | ICD-10-CM

## 2013-06-15 DIAGNOSIS — Z5181 Encounter for therapeutic drug level monitoring: Secondary | ICD-10-CM | POA: Diagnosis not present

## 2013-06-15 DIAGNOSIS — I4891 Unspecified atrial fibrillation: Secondary | ICD-10-CM

## 2013-06-15 LAB — POCT INR: INR: 1.3

## 2013-06-16 DIAGNOSIS — M545 Low back pain, unspecified: Secondary | ICD-10-CM | POA: Diagnosis not present

## 2013-06-16 DIAGNOSIS — IMO0002 Reserved for concepts with insufficient information to code with codable children: Secondary | ICD-10-CM | POA: Diagnosis not present

## 2013-06-16 DIAGNOSIS — G894 Chronic pain syndrome: Secondary | ICD-10-CM | POA: Diagnosis not present

## 2013-06-16 DIAGNOSIS — M48062 Spinal stenosis, lumbar region with neurogenic claudication: Secondary | ICD-10-CM | POA: Diagnosis not present

## 2013-06-21 ENCOUNTER — Other Ambulatory Visit: Payer: Self-pay | Admitting: Cardiovascular Disease

## 2013-06-24 ENCOUNTER — Encounter (INDEPENDENT_AMBULATORY_CARE_PROVIDER_SITE_OTHER): Payer: Self-pay

## 2013-06-24 ENCOUNTER — Ambulatory Visit (INDEPENDENT_AMBULATORY_CARE_PROVIDER_SITE_OTHER): Payer: Medicare Other | Admitting: Cardiovascular Disease

## 2013-06-24 ENCOUNTER — Encounter: Payer: Self-pay | Admitting: Cardiovascular Disease

## 2013-06-24 VITALS — BP 165/86 | HR 62 | Ht 65.0 in | Wt 117.4 lb

## 2013-06-24 DIAGNOSIS — R259 Unspecified abnormal involuntary movements: Secondary | ICD-10-CM | POA: Diagnosis not present

## 2013-06-24 DIAGNOSIS — I10 Essential (primary) hypertension: Secondary | ICD-10-CM

## 2013-06-24 DIAGNOSIS — I6529 Occlusion and stenosis of unspecified carotid artery: Secondary | ICD-10-CM | POA: Diagnosis not present

## 2013-06-24 DIAGNOSIS — G252 Other specified forms of tremor: Secondary | ICD-10-CM | POA: Diagnosis not present

## 2013-06-24 DIAGNOSIS — G250 Essential tremor: Secondary | ICD-10-CM

## 2013-06-24 DIAGNOSIS — I4891 Unspecified atrial fibrillation: Secondary | ICD-10-CM

## 2013-06-24 DIAGNOSIS — R251 Tremor, unspecified: Secondary | ICD-10-CM | POA: Insufficient documentation

## 2013-06-24 NOTE — Assessment & Plan Note (Signed)
Runs normal at home she will monitor  Consider adding calcium blocker or ACE

## 2013-06-24 NOTE — Assessment & Plan Note (Signed)
Family history of tremor and previous CVA  F/U referral to Dr TAT for evaluation.

## 2013-06-24 NOTE — Assessment & Plan Note (Signed)
Continue rate control and anticoagulation  INR checked by Edrick Oh in [place]

## 2013-06-24 NOTE — Patient Instructions (Addendum)
Your physician wants you to follow-up in:   [place] will receive a reminder letter in the mail two months in advance. If you don't receive a letter, please call our office to schedule the follow-up appointment. Your physician recommends that you continue on your current medications as directed. Please refer to the Current Medication list given to you today.  You have been referred to DR  TAT   [facility]

## 2013-06-24 NOTE — Assessment & Plan Note (Signed)
F/U [facility] moderate bilateral ICA stenosis  Continue asa and coumadin

## 2013-06-24 NOTE — Progress Notes (Signed)
Patient ID: [name], female   DOB: September 09, 1940, 73 y.o.   MRN: [id number] [name] is seen for F/U of PAF, bruit, right subclavian steal and anticoagulatin She has known vascular disease primarily in the sublavian and carotids. She was admitted with syncope last year . She had PAF with diastolic heart failure. Her cath showed only diagonal disease with no critical major vessel disease. EF was normal. 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 She has 60-79% stenosis on left ICA. In combination with rapid atrial fibrillation this may have contributed to a brief loss of consdousness. Her diastolic CHF cleared with restoration of NSR. She was sent home on low dose amiodarone. She has had no SOB, or palpitations since being home. there have been no bleeding problems.  Reviewed duplex from [facility] 0/27/74 12-87% LICA stenosis. Innominate right occlusion with steal      ROS: Denies fever, malais, weight loss, blurry vision, decreased visual acuity, cough, sputum, SOB, hemoptysis, pleuritic pain, palpitaitons, heartburn, abdominal pain, melena, lower extremity edema, claudication, or rash.  All other systems reviewed and negative  General: Affect appropriate Chronically ill female  HEENT: normal Neck supple with no adenopathy JVP normal bliateral  bruits no thyromegaly Lungs clear with no wheezing and good diaphragmatic motion Heart:  S1/S2 no murmur, no rub, gallop or click PMI normal Abdomen: benighn, BS positve, no tenderness, no AAA no bruit.  No HSM or HJR Distal pulses intact with no bruits No edema Neuro non-focal  Essential tremor head and UEls left worse than rigtht Skin multiple echymosis  No muscular weakness   Current Outpatient Prescriptions  Medication Sig Dispense Refill  . acetaminophen (TYLENOL) 500 MG tablet Take 500 mg by mouth every 6 (six) hours as needed for pain.      Marland Kitchen alendronate (FOSAMAX) 70 MG tablet  Take 70 mg by mouth every 7 (seven) days. Take in the morning with a full glass of water, on an empty stomach, and do not take anything else by mouth or lie down for the next 30 min. Patient take on Sunday      . amiodarone (PACERONE) 200 MG tablet Take 1 tablet (200 mg total) by mouth 2 (two) times daily.  180 tablet  2  . amLODipine-valsartan (EXFORGE) 5-160 MG per tablet Take 1 tablet by mouth daily.  90 tablet  3  . aspirin EC 81 MG tablet Take 81 mg by mouth daily.        . calcium-vitamin D (OSCAL) 250-125 MG-UNIT per tablet Take 1 tablet by mouth 2 (two) times daily.        . clopidogrel (PLAVIX) 75 MG tablet Take 1 tablet (75 mg total) by mouth every morning.  90 tablet  0  . colesevelam (WELCHOL) 625 MG tablet Take 3 tablets (1,875 mg total) by mouth 2 (two) times daily with a meal.  540 tablet  1  . HYDROcodone-acetaminophen (NORCO) 5-325 MG per tablet Take 1 tablet by mouth every 6 (six) hours as needed. pain      . Iron-FA-B Cmp-C-Biot-Probiotic (FUSION PLUS) CAPS Take 1 capsule by mouth daily.      . magnesium oxide (MAG-OX) 400 MG tablet Take 400 mg by mouth daily.      . meloxicam (MOBIC) 15 MG tablet Take 15 mg by mouth every morning.        . metoprolol tartrate (LOPRESSOR) 25 MG tablet Take 3 tablets (75 mg  total) by mouth 2 (two) times daily.  540 tablet  3  . Multiple Vitamin (MULTIVITAMIN) capsule Take 1 capsule by mouth daily.        . Omega-3 Fatty Acids (FISH OIL) 1200 MG CAPS Take 1,200 mg by mouth 2 (two) times daily.      . pravastatin (PRAVACHOL) 40 MG tablet Take 1 tablet by mouth  every night at bedtime  90 tablet  1  . warfarin (COUMADIN) 4 MG tablet Take 4 mg by mouth See admin instructions. Take one tablet (4mg ) by mouth on Sundays and Thursdays. Take one half tablet (2mg ) on all other days       No current facility-administered medications for this visit.    Allergies  Iodine; Iohexol; and Tape  Electrocardiogram:  SR rate 62 PR 276  LVH    Assessment and  Plan

## 2013-06-25 ENCOUNTER — Ambulatory Visit (INDEPENDENT_AMBULATORY_CARE_PROVIDER_SITE_OTHER): Payer: Medicare Other | Admitting: *Deleted

## 2013-06-25 DIAGNOSIS — I4891 Unspecified atrial fibrillation: Secondary | ICD-10-CM

## 2013-06-25 DIAGNOSIS — Z5181 Encounter for therapeutic drug level monitoring: Secondary | ICD-10-CM | POA: Diagnosis not present

## 2013-06-25 DIAGNOSIS — Z7901 Long term (current) use of anticoagulants: Secondary | ICD-10-CM | POA: Diagnosis not present

## 2013-06-25 LAB — POCT INR: INR: 1.8

## 2013-06-29 ENCOUNTER — Encounter: Payer: Self-pay | Admitting: Neurology

## 2013-06-29 ENCOUNTER — Ambulatory Visit (INDEPENDENT_AMBULATORY_CARE_PROVIDER_SITE_OTHER): Payer: Medicare Other | Admitting: Neurology

## 2013-06-29 VITALS — BP 156/88 | HR 78 | Resp 20 | Ht 65.0 in | Wt 115.0 lb

## 2013-06-29 DIAGNOSIS — I4891 Unspecified atrial fibrillation: Secondary | ICD-10-CM

## 2013-06-29 DIAGNOSIS — G251 Drug-induced tremor: Secondary | ICD-10-CM

## 2013-06-29 DIAGNOSIS — T50904A Poisoning by unspecified drugs, medicaments and biological substances, undetermined, initial encounter: Secondary | ICD-10-CM

## 2013-06-29 DIAGNOSIS — G250 Essential tremor: Secondary | ICD-10-CM | POA: Diagnosis not present

## 2013-06-29 DIAGNOSIS — I6529 Occlusion and stenosis of unspecified carotid artery: Secondary | ICD-10-CM | POA: Diagnosis not present

## 2013-06-29 DIAGNOSIS — G252 Other specified forms of tremor: Secondary | ICD-10-CM

## 2013-06-29 NOTE — Progress Notes (Signed)
Subjective:    [name] was seen in consultation in the movement disorder clinic at the request of [name].  Her PCP is Delphina Cahill, MD.  The evaluation is for tremor.  The patient is a 73 y.o. right handed female with a history of tremor.  Tremor started decades ago, per the husband (pt denies this) but her husband states that tremor picked up after "medications" were added.  When it started years ago, it started in the hands and really was very mild.  Over the last few years, hand tremor has increased and head tremor has started. Amiodarone was started 3 years ago when she had a-fib.  Her husband states that tremor got worse when medication for atrial fibrillation was added but he wasn't sure which medication it was because several were added at the same time.  There is a family hx of tremor in her son in his hands; her father also had PD which caused tremor.  She has trouble with picking up a pot full of water.  She has trouble with writing.      Affected by caffeine:  no (has 1 cup of coffee every 4-5 days and does not seem to change tremor) Affected by alcohol:  yes (but only 1-3 glasses wine per month) Affected by stress:  yes (and worse when angered) Affected by fatigue:  no Spills soup if on spoon:  yes and so drinks her soup Spills glass of liquid if full:  yes Affects ADL's (tying shoes, brushing teeth, etc):  yes (has to stabilize arm to put on makeup, brush teeth)  Current/Previously tried tremor medications: n/a  Current medications that may exacerbate tremor:  amiodarone  Outside reports reviewed: historical medical records and referral letter/letters.  Allergies  Allergen Reactions  . Iodine Rash and Other (See Comments)    BETADINE Rash/burning, blisters on skin.  . Iohexol Rash and Other (See Comments)    Blisters; PT NEEDS 13-HOUR PREP   . Tape Itching and Rash    PAPER TAPE    Current Outpatient Prescriptions on File Prior to Visit  Medication Sig Dispense  Refill  . acetaminophen (TYLENOL) 500 MG tablet Take 500 mg by mouth every 6 (six) hours as needed for pain.      Marland Kitchen alendronate (FOSAMAX) 70 MG tablet Take 70 mg by mouth every 7 (seven) days. Take in the morning with a full glass of water, on an empty stomach, and do not take anything else by mouth or lie down for the next 30 min. Patient take on Sunday      . amiodarone (PACERONE) 200 MG tablet Take 1 tablet (200 mg total) by mouth 2 (two) times daily.  180 tablet  2  . amLODipine-valsartan (EXFORGE) 5-160 MG per tablet Take 1 tablet by mouth daily.  90 tablet  3  . aspirin EC 81 MG tablet Take 81 mg by mouth daily.        . calcium-vitamin D (OSCAL) 250-125 MG-UNIT per tablet Take 1 tablet by mouth 2 (two) times daily.        . clopidogrel (PLAVIX) 75 MG tablet Take 1 tablet (75 mg total) by mouth every morning.  90 tablet  0  . colesevelam (WELCHOL) 625 MG tablet Take 3 tablets (1,875 mg total) by mouth 2 (two) times daily with a meal.  540 tablet  1  . HYDROcodone-acetaminophen (NORCO) 5-325 MG per tablet Take 1 tablet by mouth every 6 (six) hours as needed. pain      .  Iron-FA-B Cmp-C-Biot-Probiotic (FUSION PLUS) CAPS Take 1 capsule by mouth daily.      . magnesium oxide (MAG-OX) 400 MG tablet Take 400 mg by mouth daily.      . meloxicam (MOBIC) 15 MG tablet Take 15 mg by mouth every morning.        . metoprolol tartrate (LOPRESSOR) 25 MG tablet Take 3 tablets (75 mg total) by mouth 2 (two) times daily.  540 tablet  3  . Multiple Vitamin (MULTIVITAMIN) capsule Take 1 capsule by mouth daily.        . Omega-3 Fatty Acids (FISH OIL) 1200 MG CAPS Take 1,200 mg by mouth 2 (two) times daily.      . pravastatin (PRAVACHOL) 40 MG tablet Take 1 tablet by mouth  every night at bedtime  90 tablet  1  . warfarin (COUMADIN) 4 MG tablet Take 4 mg by mouth See admin instructions. Take one tablet (4mg ) by mouth on Sundays and Thursdays. Take one half tablet (2mg ) on all other days       No current  facility-administered medications on file prior to visit.    Past Medical History  Diagnosis Date  . Subclavian steal syndrome   . Carotid bruit     LICA 16-10% (duplex 9/60)  . Hypertension   . Hyperlipidemia   . Atrial fibrillation   . Anticoagulation management encounter   . Meningioma   . Osteoporosis   . Stroke   . Pneumonia   . Breast cancer     S/P left mastectomy    Past Surgical History  Procedure Laterality Date  . Cardiac catheterization    . Mastectomy Left 1990  . Cataract extraction Bilateral 2013  . Colonoscopy  11/17/2010    Procedure: COLONOSCOPY;  Surgeon: [name], MD;  Location: [facility];  Service: Endoscopy;  Laterality: N/A;  10:45 am  . Esophagogastroduodenoscopy  11/17/2010    Procedure: ESOPHAGOGASTRODUODENOSCOPY (EGD);  Surgeon: [name], MD;  Location: [facility];  Service: Endoscopy;  Laterality: N/A;    History   Social History  . Marital Status: Married    Spouse Name: N/A    Number of Children: 2  . Years of Education: N/A   Occupational History  . Retired    Social History Main Topics  . Smoking status: Former Smoker -- 1.00 packs/day for 56 years    Types: Cigarettes    Quit date: 02/14/2007  . Smokeless tobacco: Never Used     Comment: x 4 yrs.  . Alcohol Use: Yes     Comment: occasional alcohol  . Drug Use: No  . Sexual Activity: No   Other Topics Concern  . Not on file   Social History Narrative  . No narrative on file    Family Status  Relation Status Death Age  . Mother Deceased     Dementia  . Father Deceased     Parkinson's Disease  . Son Alive     good health  . Daughter Alive     good health    Review of Systems A complete 10 system ROS was obtained and was negative apart from what is mentioned.   Objective:   VITALS:   Filed Vitals:   06/29/13 0813  BP: 156/88  Pulse: 78  Resp: 20  Height: 5\' 5"  (1.651 m)  Weight: 115 lb (52.164 kg)   Gen:  Appears stated age and in  NAD. HEENT:  Normocephalic, atraumatic. The mucous membranes are moist.  The superficial temporal arteries are without ropiness or tenderness. Cardiovascular: Irreg irregular Lungs: Clear to auscultation bilaterally. Neck: There are no carotid bruits noted bilaterally.  NEUROLOGICAL:  Orientation:  The patient is alert and oriented x 3.  Recent and remote memory are intact.  Attention span and concentration are normal.  Able to name objects and repeat without trouble.  Fund of knowledge is appropriate Cranial nerves: There is good facial symmetry. The pupils are equal round and reactive to light bilaterally. Fundoscopic exam reveals clear disc margins bilaterally. Extraocular muscles are intact and visual fields are full to confrontational testing. Speech is fluent and clear. Soft palate rises symmetrically and there is no tongue deviation. Hearing is intact to conversational tone. Tone: Tone is good throughout. Sensation: Sensation is intact to light touch and pinprick throughout (facial, trunk, extremities). Vibration is intact at the bilateral big toe but she has trouble noting when it stops.  There is no extinction with double simultaneous stimulation. There is no sensory dermatomal level identified. Coordination:  The patient has no dysdiadichokinesia or dysmetria. Motor: Strength is 5/5 in the bilateral upper and lower extremities.  Shoulder shrug is equal bilaterally.  There is no pronator drift.  There are no fasciculations noted. DTR's: Deep tendon reflexes are 2/4 at the bilateral biceps, triceps, brachioradialis, patella and trace at the bilateral  achilles.  Plantar responses are downgoing bilaterally. Gait and Station: The patient is able to ambulate without difficulty. Good arm swing The patient is able to ambulate in a tandem fashion. The patient is able to stand in the Romberg position but sways with eyes closed  MOVEMENT EXAM: Tremor:  There is tremor in the UE, noted most  significantly with action; the L hand is worse than the right.  There is head tremor in the "yes" direction without definitive null point.  The patient is not able to draw Archimedes spirals without significant difficulty.  She especially has trouble with the L hand.  There is head tremor at rest and mild bilateral foot tremor.   The patient is not able to pour water from one glass to another without spilling it.  LABS:  No results found for this basename: TSH        Assessment/Plan:   1. Tremor.  -Given the history, I suspect that she has had mild ET which has gotten significantly worse with the addition of amiodarone.  33% of patients on this medication will experience some degree of tremor.  Before I add more medication, we decided to ask [name] if this medication can be changed or if the dosage can be altered.  If not, we will likely try medication for tremor to see if we can get some degree of tremor control.  -Her neuro examination is non focal and non lateralizing.  Reassurance was provided.  No evidence of neurodegenerative process.  -Will try to get copy of labwork from PCP.  She states that tsh and basic panels done in December.    -Pt education provided.

## 2013-06-30 DIAGNOSIS — M545 Low back pain, unspecified: Secondary | ICD-10-CM | POA: Diagnosis not present

## 2013-06-30 DIAGNOSIS — M48062 Spinal stenosis, lumbar region with neurogenic claudication: Secondary | ICD-10-CM | POA: Diagnosis not present

## 2013-06-30 DIAGNOSIS — G894 Chronic pain syndrome: Secondary | ICD-10-CM | POA: Diagnosis not present

## 2013-06-30 DIAGNOSIS — IMO0002 Reserved for concepts with insufficient information to code with codable children: Secondary | ICD-10-CM | POA: Diagnosis not present

## 2013-07-02 ENCOUNTER — Telehealth: Payer: Self-pay | Admitting: Neurology

## 2013-07-02 NOTE — Telephone Encounter (Signed)
[name], will you let pt know that I got labs from her PCP in Jan and noted that her liver enzymes were a little elevated.  Has she followed up with that?

## 2013-07-03 ENCOUNTER — Telehealth: Payer: Self-pay | Admitting: Neurology

## 2013-07-03 ENCOUNTER — Other Ambulatory Visit: Payer: Self-pay | Admitting: Cardiovascular Disease

## 2013-07-03 ENCOUNTER — Telehealth: Payer: Self-pay | Admitting: Cardiovascular Disease

## 2013-07-03 NOTE — Telephone Encounter (Signed)
Patient made aware to follow up with Dr Johnsie Cancel about medication change. She just saw him last week and will call him to follow up.

## 2013-07-03 NOTE — Telephone Encounter (Signed)
Message copied by Richmond Campbell on Fri Jul 03, 2013 10:37 AM ------      Message from: Josue Hector      Created: Wed Jul 01, 2013  9:06 AM       Did she want to lower amiodarone            ----- Message -----         From: Richmond Campbell, LPN         Sent: 11/17/5724   8:18 AM           To: Josue Hector, MD                        ----- Message -----         From: Josue Hector, MD         Sent: 06/29/2013   3:42 PM           To: Richmond Campbell, LPN            Tell Hannah that the neurologist thinks the amiodarone may make her tremor worse      See if she wants to atleast decrease it to 200mg  dialy            ----- Message -----         From: Ludwig Clarks, DO         Sent: 06/29/2013   9:26 AM           To: Josue Hector, MD                         ------

## 2013-07-03 NOTE — Telephone Encounter (Signed)
Pt  Would like a call back [phone]

## 2013-07-03 NOTE — Telephone Encounter (Signed)
PT  NOTIFIED   MAY DECREASE  AMIODARONE  TO   200 MG   PER  TAT AND  THIS  IS OKAY PER  DR NISHAN./CY

## 2013-07-03 NOTE — Telephone Encounter (Signed)
Patient spoke with Dr Johnsie Cancel and they decreased amiodarone to 200 mg from 400 mg. She will call with any other questions/problems.

## 2013-07-03 NOTE — Telephone Encounter (Signed)
I did and he said that he would look into it.  Perhaps she should make a f/u to discuss with him

## 2013-07-03 NOTE — Telephone Encounter (Signed)
Spoke with patient and made her aware lab results showed increased liver enzymes. She does have a follow up for repeat lab work on 08/06/2013. She was asking if we had heard back from Dr Johnsie Cancel about lowering the dose of her amiodarone. Please advise.

## 2013-07-03 NOTE — Telephone Encounter (Signed)
New message   Dr Tat told pt amiodarone causes tremors and for pt to ask Dr Johnsie Cancel if he want to keep her on it

## 2013-07-16 ENCOUNTER — Ambulatory Visit (INDEPENDENT_AMBULATORY_CARE_PROVIDER_SITE_OTHER): Payer: Medicare Other | Admitting: *Deleted

## 2013-07-16 DIAGNOSIS — Z5181 Encounter for therapeutic drug level monitoring: Secondary | ICD-10-CM

## 2013-07-16 DIAGNOSIS — Z7901 Long term (current) use of anticoagulants: Secondary | ICD-10-CM | POA: Diagnosis not present

## 2013-07-16 DIAGNOSIS — I4891 Unspecified atrial fibrillation: Secondary | ICD-10-CM

## 2013-07-16 LAB — POCT INR: INR: 1.8

## 2013-07-20 ENCOUNTER — Ambulatory Visit: Payer: Medicare Other | Admitting: Surgery

## 2013-07-20 ENCOUNTER — Other Ambulatory Visit (HOSPITAL_COMMUNITY): Payer: Medicare Other

## 2013-08-05 ENCOUNTER — Ambulatory Visit (INDEPENDENT_AMBULATORY_CARE_PROVIDER_SITE_OTHER): Payer: Medicare Other | Admitting: *Deleted

## 2013-08-05 DIAGNOSIS — Z7901 Long term (current) use of anticoagulants: Secondary | ICD-10-CM

## 2013-08-05 DIAGNOSIS — Z5181 Encounter for therapeutic drug level monitoring: Secondary | ICD-10-CM

## 2013-08-05 DIAGNOSIS — E785 Hyperlipidemia, unspecified: Secondary | ICD-10-CM | POA: Diagnosis not present

## 2013-08-05 DIAGNOSIS — I4891 Unspecified atrial fibrillation: Secondary | ICD-10-CM | POA: Diagnosis not present

## 2013-08-05 DIAGNOSIS — R945 Abnormal results of liver function studies: Secondary | ICD-10-CM | POA: Diagnosis not present

## 2013-08-05 DIAGNOSIS — R7402 Elevation of levels of lactic acid dehydrogenase (LDH): Secondary | ICD-10-CM | POA: Diagnosis not present

## 2013-08-05 LAB — POCT INR: INR: 4

## 2013-08-07 ENCOUNTER — Encounter: Payer: Self-pay | Admitting: Surgery

## 2013-08-10 ENCOUNTER — Ambulatory Visit (HOSPITAL_COMMUNITY)
Admission: RE | Admit: 2013-08-10 | Discharge: 2013-08-10 | Disposition: A | Discharge status: Home / Self Care | Payer: Medicare Other | Source: Ambulatory Visit | Attending: Surgery | Admitting: Surgery

## 2013-08-10 ENCOUNTER — Ambulatory Visit (INDEPENDENT_AMBULATORY_CARE_PROVIDER_SITE_OTHER): Payer: Medicare Other | Admitting: Surgery

## 2013-08-10 ENCOUNTER — Encounter: Payer: Self-pay | Admitting: Surgery

## 2013-08-10 VITALS — BP 169/78 | HR 61 | Resp 18 | Ht 65.5 in | Wt 115.4 lb

## 2013-08-10 DIAGNOSIS — I6529 Occlusion and stenosis of unspecified carotid artery: Secondary | ICD-10-CM | POA: Diagnosis not present

## 2013-08-10 NOTE — Progress Notes (Signed)
Patient name: [name] MRN: [id number] DOB: 12-10-1940 Sex: female     Chief Complaint  Patient presents with  . Follow-up    6 month FU Carotid with duplex  . Carotid    HISTORY OF PRESENT ILLNESS: The patient is back today for followup of her carotid disease in the setting of innominate artery occlusion.  She suffered a stroke back in 2008 at which time she was diagnosed via catheter angiography is having an occluded innominate artery.  I met the patient for the first time in 2011 when she developed left carotid stenosis.  She has remained asymptomatic.  She has been followed biannually with Doppler ultrasound.  Today she does not endorse amaurosis, numbness or weakness in either extremity or slurred speech.  She continues to take Coumadin.  Past Medical History  Diagnosis Date  . Subclavian steal syndrome   . Carotid bruit     LICA 98-33% (duplex 8/25)  . Hypertension   . Hyperlipidemia   . Atrial fibrillation   . Anticoagulation management encounter   . Meningioma   . Osteoporosis   . Stroke   . Pneumonia   . Breast cancer     S/P left mastectomy    Past Surgical History  Procedure Laterality Date  . Cardiac catheterization    . Mastectomy Left 1990  . Cataract extraction Bilateral 2013  . Colonoscopy  11/17/2010    Procedure: COLONOSCOPY;  Surgeon: [name], MD;  Location: [facility];  Service: Endoscopy;  Laterality: N/A;  10:45 am  . Esophagogastroduodenoscopy  11/17/2010    Procedure: ESOPHAGOGASTRODUODENOSCOPY (EGD);  Surgeon: [name], MD;  [facility]: [facility];  Service: Endoscopy;  Laterality: N/A;  . Lumbar epidural injection  06-2012--06-2013    pt. states she has had 5 epidurals in 06-2012----06-2013    History   Social History  . Marital Status: Married    Spouse Name: N/A    Number of Children: 2  . Years of Education: N/A   Occupational History  . Retired    Social History Main Topics  . Smoking status: Former Smoker  -- 1.00 packs/day for 56 years    Types: Cigarettes    Quit date: 02/14/2007  . Smokeless tobacco: Never Used     Comment: x 4 yrs.  . Alcohol Use: Yes     Comment: occasional alcohol  . Drug Use: No  . Sexual Activity: No   Other Topics Concern  . Not on file   Social History Narrative  . No narrative on file    Family History  Problem Relation Age of Onset  . Alzheimer's disease Mother   . Dementia Mother   . Parkinsonism Father     Allergies as of 08/10/2013 - Review Complete 08/10/2013  Allergen Reaction Noted  . Iodine Rash and Other (See Comments) 10/24/2009  . Iohexol Rash and Other (See Comments) 01/07/2007  . Tape Itching and Rash 08/01/2011    Current Outpatient Prescriptions on File Prior to Visit  Medication Sig Dispense Refill  . acetaminophen (TYLENOL) 500 MG tablet Take 500 mg by mouth every 6 (six) hours as needed for pain.      Marland Kitchen alendronate (FOSAMAX) 70 MG tablet Take 70 mg by mouth every 7 (seven) days. Take in the morning with a full glass of water, on an empty stomach, and do not take anything else by mouth or lie down for the next 30 min. Patient take on Sunday      .  amiodarone (PACERONE) 200 MG tablet Take 1 tablet (200 mg total) by mouth daily.  90 tablet  1  . aspirin EC 81 MG tablet Take 81 mg by mouth daily.        . calcium-vitamin D (OSCAL) 250-125 MG-UNIT per tablet Take 1 tablet by mouth 2 (two) times daily.        . clopidogrel (PLAVIX) 75 MG tablet Take 1 tablet (75 mg total) by mouth every morning.  90 tablet  0  . colesevelam (WELCHOL) 625 MG tablet Take 3 tablets (1,875 mg total) by mouth 2 (two) times daily with a meal.  540 tablet  1  . HYDROcodone-acetaminophen (NORCO) 5-325 MG per tablet Take 1 tablet by mouth every 6 (six) hours as needed. pain      . Iron-FA-B Cmp-C-Biot-Probiotic (FUSION PLUS) CAPS Take 1 capsule by mouth daily.      . magnesium oxide (MAG-OX) 400 MG tablet Take 400 mg by mouth daily.      . meloxicam (MOBIC) 15  MG tablet Take 15 mg by mouth every morning.        . metoprolol tartrate (LOPRESSOR) 25 MG tablet Take 3 tablets (75 mg total) by mouth 2 (two) times daily.  540 tablet  3  . Multiple Vitamin (MULTIVITAMIN) capsule Take 1 capsule by mouth daily.        . Omega-3 Fatty Acids (FISH OIL) 1200 MG CAPS Take 1,200 mg by mouth 2 (two) times daily.      . pravastatin (PRAVACHOL) 40 MG tablet Take 1 tablet by mouth  every night at bedtime  90 tablet  1  . warfarin (COUMADIN) 4 MG tablet Take 4 mg by mouth See admin instructions. Take one tablet (9m) by mouth on Sundays and Thursdays. Take one half tablet (229m on all other days      . amLODipine-valsartan (EXFORGE) 5-160 MG per tablet Take 1 tablet by mouth daily.  90 tablet  3  . diazepam (VALIUM) 5 MG tablet        No current facility-administered medications on file prior to visit.     REVIEW OF SYSTEMS: Cardiovascular: No chest pain, chest pressure, palpitations, orthopnea, or dyspnea on exertion. No claudication or rest pain,  No history of DVT or phlebitis. Pulmonary: No productive cough, asthma or wheezing. Neurologic: No weakness, paresthesias, aphasia, or amaurosis. No dizziness. Hematologic: Positive for easy bruising. Musculoskeletal: No joint pain or joint swelling. Gastrointestinal: No blood in stool or hematemesis Genitourinary: No dysuria or hematuria. Psychiatric:: No history of major depression. Integumentary: No rashes or ulcers. Constitutional: No fever or chills.  PHYSICAL EXAMINATION:   Vital signs are BP 169/78  Pulse 61  Resp 18  Ht 5' 5.5" (1.664 m)  Wt 115 lb 6.4 oz (52.345 kg)  BMI 18.90 kg/m2 General: The patient appears their stated age. HEENT:  No gross abnormalities Pulmonary:  Non labored breathing Abdomen: Soft and non-tender.  Aorta nonpalpable Musculoskeletal: There are no major deformities. Neurologic: No focal weakness or paresthesias are detected, Skin: There are no ulcer or rashes  noted. Psychiatric: The patient has normal affect. Cardiovascular: There is a regular rate and rhythm without significant murmur appreciated.  Positive bruit   Diagnostic Studies I have ordered and reviewed her carotid ultrasound.  This shows less than 40% right internal carotid stenosis which may be decreased to 2 proximal disease.  There is a 40-59% left internal carotid stenosis.  Assessment: Innominate artery occlusion Plan: The patient remains asymptomatic.  We will  continue with routine Doppler ultrasound.  Eldridge Abrahams, M.D. [facility] Office: [phone] Pager:  [phone]

## 2013-08-10 NOTE — Addendum Note (Signed)
Addended by: Mena Goes on: 08/10/2013 02:32 PM   Modules accepted: Orders

## 2013-08-19 ENCOUNTER — Ambulatory Visit (INDEPENDENT_AMBULATORY_CARE_PROVIDER_SITE_OTHER): Payer: Medicare Other | Admitting: *Deleted

## 2013-08-19 DIAGNOSIS — Z7901 Long term (current) use of anticoagulants: Secondary | ICD-10-CM | POA: Diagnosis not present

## 2013-08-19 DIAGNOSIS — I4891 Unspecified atrial fibrillation: Secondary | ICD-10-CM | POA: Diagnosis not present

## 2013-08-19 DIAGNOSIS — Z5181 Encounter for therapeutic drug level monitoring: Secondary | ICD-10-CM

## 2013-08-19 LAB — POCT INR: INR: 1.9

## 2013-09-05 ENCOUNTER — Emergency Department (HOSPITAL_COMMUNITY)
Admission: EM | Admit: 2013-09-05 | Discharge: 2013-09-05 | Disposition: A | Discharge status: Home / Self Care | Payer: Medicare Other | Attending: Emergency Medicine | Admitting: Emergency Medicine

## 2013-09-05 ENCOUNTER — Encounter (HOSPITAL_COMMUNITY): Payer: Self-pay | Admitting: Emergency Medicine

## 2013-09-05 ENCOUNTER — Emergency Department (HOSPITAL_COMMUNITY): Payer: Medicare Other

## 2013-09-05 DIAGNOSIS — Y929 Unspecified place or not applicable: Secondary | ICD-10-CM | POA: Insufficient documentation

## 2013-09-05 DIAGNOSIS — I10 Essential (primary) hypertension: Secondary | ICD-10-CM | POA: Insufficient documentation

## 2013-09-05 DIAGNOSIS — Z7902 Long term (current) use of antithrombotics/antiplatelets: Secondary | ICD-10-CM | POA: Insufficient documentation

## 2013-09-05 DIAGNOSIS — S32509A Unspecified fracture of unspecified pubis, initial encounter for closed fracture: Secondary | ICD-10-CM | POA: Insufficient documentation

## 2013-09-05 DIAGNOSIS — Z8701 Personal history of pneumonia (recurrent): Secondary | ICD-10-CM | POA: Insufficient documentation

## 2013-09-05 DIAGNOSIS — Y9389 Activity, other specified: Secondary | ICD-10-CM | POA: Insufficient documentation

## 2013-09-05 DIAGNOSIS — Z9889 Other specified postprocedural states: Secondary | ICD-10-CM | POA: Diagnosis not present

## 2013-09-05 DIAGNOSIS — Z7982 Long term (current) use of aspirin: Secondary | ICD-10-CM | POA: Insufficient documentation

## 2013-09-05 DIAGNOSIS — S79919A Unspecified injury of unspecified hip, initial encounter: Secondary | ICD-10-CM | POA: Diagnosis not present

## 2013-09-05 DIAGNOSIS — S32591A Other specified fracture of right pubis, initial encounter for closed fracture: Secondary | ICD-10-CM

## 2013-09-05 DIAGNOSIS — E785 Hyperlipidemia, unspecified: Secondary | ICD-10-CM | POA: Insufficient documentation

## 2013-09-05 DIAGNOSIS — Z853 Personal history of malignant neoplasm of breast: Secondary | ICD-10-CM | POA: Diagnosis not present

## 2013-09-05 DIAGNOSIS — Z7901 Long term (current) use of anticoagulants: Secondary | ICD-10-CM | POA: Insufficient documentation

## 2013-09-05 DIAGNOSIS — Z791 Long term (current) use of non-steroidal anti-inflammatories (NSAID): Secondary | ICD-10-CM | POA: Diagnosis not present

## 2013-09-05 DIAGNOSIS — I4891 Unspecified atrial fibrillation: Secondary | ICD-10-CM | POA: Insufficient documentation

## 2013-09-05 DIAGNOSIS — Z86011 Personal history of benign neoplasm of the brain: Secondary | ICD-10-CM | POA: Diagnosis not present

## 2013-09-05 DIAGNOSIS — M25559 Pain in unspecified hip: Secondary | ICD-10-CM | POA: Diagnosis not present

## 2013-09-05 DIAGNOSIS — S79929A Unspecified injury of unspecified thigh, initial encounter: Secondary | ICD-10-CM | POA: Diagnosis not present

## 2013-09-05 DIAGNOSIS — Z8673 Personal history of transient ischemic attack (TIA), and cerebral infarction without residual deficits: Secondary | ICD-10-CM | POA: Insufficient documentation

## 2013-09-05 DIAGNOSIS — W19XXXA Unspecified fall, initial encounter: Secondary | ICD-10-CM

## 2013-09-05 DIAGNOSIS — Z8739 Personal history of other diseases of the musculoskeletal system and connective tissue: Secondary | ICD-10-CM | POA: Insufficient documentation

## 2013-09-05 DIAGNOSIS — Z87891 Personal history of nicotine dependence: Secondary | ICD-10-CM | POA: Diagnosis not present

## 2013-09-05 DIAGNOSIS — W010XXA Fall on same level from slipping, tripping and stumbling without subsequent striking against object, initial encounter: Secondary | ICD-10-CM | POA: Insufficient documentation

## 2013-09-05 LAB — CBC WITH DIFFERENTIAL/PLATELET
BASOS ABS: 0.1 10*3/uL (ref 0.0–0.1)
BASOS PCT: 1 % (ref 0–1)
Eosinophils Absolute: 0.2 10*3/uL (ref 0.0–0.7)
Eosinophils Relative: 2 % (ref 0–5)
HEMATOCRIT: 38.2 % (ref 36.0–46.0)
Hemoglobin: 12.8 g/dL (ref 12.0–15.0)
Lymphocytes Relative: 15 % (ref 12–46)
Lymphs Abs: 1.5 10*3/uL (ref 0.7–4.0)
MCH: 32.3 pg (ref 26.0–34.0)
MCHC: 33.5 g/dL (ref 30.0–36.0)
MCV: 96.5 fL (ref 78.0–100.0)
MONO ABS: 1.1 10*3/uL — AB (ref 0.1–1.0)
Monocytes Relative: 10 % (ref 3–12)
NEUTROS ABS: 7.6 10*3/uL (ref 1.7–7.7)
Neutrophils Relative %: 72 % (ref 43–77)
Platelets: 281 10*3/uL (ref 150–400)
RBC: 3.96 MIL/uL (ref 3.87–5.11)
RDW: 15.3 % (ref 11.5–15.5)
WBC: 10.3 10*3/uL (ref 4.0–10.5)

## 2013-09-05 LAB — BASIC METABOLIC PANEL
BUN: 22 mg/dL (ref 6–23)
CHLORIDE: 104 meq/L (ref 96–112)
CO2: 27 mEq/L (ref 19–32)
CREATININE: 0.81 mg/dL (ref 0.50–1.10)
Calcium: 9.5 mg/dL (ref 8.4–10.5)
GFR, EST AFRICAN AMERICAN: 80 mL/min — AB (ref >90)
GFR, EST NON AFRICAN AMERICAN: 69 mL/min — AB (ref >90)
Glucose, Bld: 96 mg/dL (ref 70–99)
Potassium: 5.1 mEq/L (ref 3.7–5.3)
Sodium: 141 mEq/L (ref 137–147)

## 2013-09-05 MED ORDER — MORPHINE SULFATE 2 MG/ML IJ SOLN
2.0000 mg | Freq: Once | INTRAMUSCULAR | Status: AC
  Administered 2013-09-05: 2 mg via INTRAVENOUS
  Filled 2013-09-05: qty 1

## 2013-09-05 MED ORDER — OXYCODONE-ACETAMINOPHEN 5-325 MG PO TABS: 1.0000 | ORAL_TABLET | ORAL | Status: DC | PRN

## 2013-09-05 MED ORDER — SODIUM CHLORIDE 0.9 % IV BOLUS (SEPSIS)
500.0000 mL | Freq: Once | INTRAVENOUS | Status: AC
  Administered 2013-09-05: 500 mL via INTRAVENOUS

## 2013-09-05 NOTE — Discharge Instructions (Signed)
Stable Pelvic Fracture, Adult You have one or more fractures (this means there is a break in the bones) of the pelvis. The pelvis is the ring of bones that make up your hipbones. These are the bones you sit on and the lower part of the spine. It is like a boney ring where your legs attach and which supports your upper body. You have an un-displaced fracture. This means the bones are in good position. The pelvic fracture you have is a simple (uncomplicated) fracture. DIAGNOSIS  X-rays usually diagnose these fractures. TREATMENT  The goals of treating pelvic fractures are to get the bones to heal in a good position. The patient should return to normal activities as soon as possible. Such fractures are often treated with normal bed rest and conservative measures.  HOME CARE INSTRUCTIONS   You should be on bed rest for as long as directed by your caregiver. Change positions of your legs every 1-2 hours to maintain good blood flow. You may sit as long as is tolerable. Following this, you may do usual activities, but avoid strenuous activities for as long as directed by your caregiver.  Only take over-the-counter or prescription medicines for pain, discomfort, or fever as directed by your caregiver.  Bed-rest may also be used for discomfort.  Resume your activities when you are able. Use a cain or crutch on the injured side to reduce pain while walking, as needed.  If you develop increased pain or discomfort not relieved with medications, contact your caregiver.  Warning: Do not drive a car or operate a motor vehicle until your caregiver specifically tells you it is safe to do so. SEEK IMMEDIATE MEDICAL CARE IF:   You feel light-headed or faint, develop chest pain or shortness of breath.  An unexplained oral temperature above 102 F (38.9 C) develops.  You develop blood in the urine or in the stools.  There is difficulty urinating, and/or having a bowel movement, or pain with these  efforts.  There is a difficulty or increased pain with walking.  There is swelling in one or both legs that is not normal. Document Released: 06/04/2001 Document Revised: 11/26/2012 Document Reviewed: 11/07/2007 [facility] Patient Information 2014 [place], [place].   Use your walker. Pain medication. Followup with Dr. Luna Glasgow as needed. Ice pack.

## 2013-09-05 NOTE — ED Provider Notes (Signed)
CSN: [id number]     Arrival date & time 09/05/13  0808 History  This chart was scribed for Colleen Christen, MD by Delphia Grates, ED Scribe. This patient was seen in room APA01/APA01 and the patient's care was started at 8:35 AM.    Chief Complaint  Patient presents with  . Fall      The history is provided by the patient. No language interpreter was used.    HPI Comments: [name] is a 73 y.o. female who presents to the Emergency Department complaining of fall that occurred last night. Patient states she was walking down some steps and tripped over the last step and fell on her right side. There is associated right hip pain. Patient states she was able to walk after falling, however, she states she pain has gotten progressively worse since last night and is now unable to walk. She states the pain is worse when weight bearing. Patient has history of stroke, HTN, A-fib, and osteoporosis. She is currently taking coumadin, Plavix, and ASA. Patient denies any other injuries or history of fractures.     Past Medical History  Diagnosis Date  . Subclavian steal syndrome   . Carotid bruit     LICA 36-14% (duplex 4/31)  . Hypertension   . Hyperlipidemia   . Atrial fibrillation   . Anticoagulation management encounter   . Meningioma   . Osteoporosis   . Stroke   . Pneumonia   . Breast cancer     S/P left mastectomy   Past Surgical History  Procedure Laterality Date  . Cardiac catheterization    . Mastectomy Left 1990  . Cataract extraction Bilateral 2013  . Colonoscopy  11/17/2010    Procedure: COLONOSCOPY;  Surgeon: [name], MD;  Location: [facility];  Service: Endoscopy;  Laterality: N/A;  10:45 am  . Esophagogastroduodenoscopy  11/17/2010    Procedure: ESOPHAGOGASTRODUODENOSCOPY (EGD);  Surgeon: [name], MD;  Location: [facility];  Service: Endoscopy;  Laterality: N/A;  . Lumbar epidural injection  06-2012--06-2013    pt. states she has had 5 epidurals in  06-2012----06-2013   Family History  Problem Relation Age of Onset  . Alzheimer's disease Mother   . Dementia Mother   . Parkinsonism Father    History  Substance Use Topics  . Smoking status: Former Smoker -- 1.00 packs/day for 56 years    Types: Cigarettes    Quit date: 02/14/2007  . Smokeless tobacco: Never Used     Comment: x 4 yrs.  . Alcohol Use: Yes     Comment: occasional alcohol   OB History   Grav Para Term Preterm Abortions TAB SAB Ect Mult Living                 Review of Systems A complete 10 system review of systems was obtained and all systems are negative except as noted in the HPI and PMH.     Allergies  Iodine; Iohexol; and Tape  Home Medications   Prior to Admission medications   Medication Sig Start Date End Date Taking? Authorizing Provider  acetaminophen (TYLENOL) 500 MG tablet Take 500 mg by mouth every 6 (six) hours as needed for pain.   Yes Historical Provider, MD  alendronate (FOSAMAX) 70 MG tablet Take 70 mg by mouth every 7 (seven) days. Take in the morning with a full glass of water, on an empty stomach, and do not take anything else by mouth or lie down for  the next 30 min. Patient take on Sunday   Yes Historical Provider, MD  amiodarone (PACERONE) 200 MG tablet Take 1 tablet (200 mg total) by mouth daily. 07/03/13  Yes Josue Hector, MD  amLODipine (NORVASC) 10 MG tablet Take 10 mg by mouth daily.   Yes Historical Provider, MD  aspirin EC 81 MG tablet Take 81 mg by mouth daily.     Yes Historical Provider, MD  calcium-vitamin D (OSCAL) 250-125 MG-UNIT per tablet Take 1 tablet by mouth 2 (two) times daily.     Yes Historical Provider, MD  clopidogrel (PLAVIX) 75 MG tablet Take 1 tablet (75 mg total) by mouth every morning.   Yes Josue Hector, MD  colesevelam [facility]) 625 MG tablet Take 3 tablets (1,875 mg total) by mouth 2 (two) times daily with a meal. 10/21/12  Yes Josue Hector, MD  HYDROcodone-acetaminophen (NORCO) 5-325 MG per tablet  Take 1 tablet by mouth every 6 (six) hours as needed for moderate pain. pain   Yes Historical Provider, MD  irbesartan (AVAPRO) 300 MG tablet Take 300 mg by mouth daily.   Yes Historical Provider, MD  Iron-FA-B Cmp-C-Biot-Probiotic (FUSION PLUS) CAPS Take 1 capsule by mouth daily.   Yes Historical Provider, MD  magnesium oxide (MAG-OX) 400 MG tablet Take 400 mg by mouth daily.   Yes Historical Provider, MD  meloxicam (MOBIC) 15 MG tablet Take 15 mg by mouth every morning.     Yes Historical Provider, MD  metoprolol tartrate (LOPRESSOR) 25 MG tablet Take 3 tablets (75 mg total) by mouth 2 (two) times daily. 10/30/12  Yes Josue Hector, MD  Multiple Vitamin (MULTIVITAMIN) capsule Take 1 capsule by mouth daily.     Yes Historical Provider, MD  Omega-3 Fatty Acids (FISH OIL) 1200 MG CAPS Take 1,200 mg by mouth 2 (two) times daily.   Yes Historical Provider, MD  pravastatin (PRAVACHOL) 40 MG tablet Take 1 tablet by mouth  every night at bedtime 12/28/12  Yes Josue Hector, MD  vitamin C (ASCORBIC ACID) 500 MG tablet Take 500 mg by mouth daily.   Yes Historical Provider, MD  warfarin (COUMADIN) 4 MG tablet Take 2-4 mg by mouth daily. Takes 4 mg on Monday and 2 mg on all other days of the week.   Yes Historical Provider, MD  oxyCODONE-acetaminophen (PERCOCET) 5-325 MG per tablet Take 1 tablet by mouth every 4 (four) hours as needed. 09/05/13   Colleen Christen, MD   Triage Vitals: BP 128/66  Pulse 58  Temp(Src) 98.3 F (36.8 C) (Oral)  Resp 16  Ht 5\' 5"  (1.651 m)  Wt 115 lb (52.164 kg)  BMI 19.14 kg/m2  SpO2 100%  Physical Exam  Nursing note and vitals reviewed. Constitutional: She is oriented to person, place, and time. She appears well-developed and well-nourished.  HENT:  Head: Normocephalic and atraumatic.  Eyes: Conjunctivae and EOM are normal. Pupils are equal, round, and reactive to light.  Neck: Normal range of motion. Neck supple.  Cardiovascular: Normal rate, regular rhythm and normal heart  sounds.   Pulmonary/Chest: Effort normal and breath sounds normal.  Abdominal: Soft. Bowel sounds are normal.  Musculoskeletal: Normal range of motion.  Tenderness in right posterior lateral hip.  Neurological: She is alert and oriented to person, place, and time.  Skin: Skin is warm and dry.  Ecchymosis.  Psychiatric: She has a normal mood and affect. Her behavior is normal.    ED Course  Procedures (including critical care time)  DIAGNOSTIC STUDIES: Oxygen Saturation is 100% on room air, normal by my interpretation.    COORDINATION OF CARE: At [phone] Discussed treatment plan with patient which includes right hip xray. Patient agrees.   Labs Review Labs Reviewed  BASIC METABOLIC PANEL - Abnormal; Notable for the following:    GFR calc non Af Amer 69 (*)    GFR calc Af Amer 80 (*)    All other components within normal limits  CBC WITH DIFFERENTIAL - Abnormal; Notable for the following:    Monocytes Absolute 1.1 (*)    All other components within normal limits    Imaging Review Dg Hip Complete Right  09/05/2013   CLINICAL DATA:  Traumatic injury with right hip pain  EXAM: RIGHT HIP - COMPLETE 2+ VIEW  COMPARISON:  None.  FINDINGS: Proximal femur is within normal limits. There is lucency in the inferior pubic ramus which may be related to an undisplaced fracture. No other focal abnormality is seen.  IMPRESSION: Changes suggestive of a 80 right inferior pubic ramus fracture.   Electronically Signed   By: Inez Catalina M.D.   On: 09/05/2013 10:05     EKG Interpretation None      MDM   Final diagnoses:  Fall  Closed fracture of ramus of right pubis    Plain films of reveal a inferior right ramus fracture. Discussed with patient and her husband. Rx Percocet for pain, walker for ambulation, follow up with orthopedic surgeon.  I personally performed the services described in this documentation, which was scribed in my presence. The recorded information has been reviewed and is  accurate.    Colleen Christen, MD 09/05/13 [phone]

## 2013-09-05 NOTE — ED Notes (Signed)
Pt states that she tripped last night causing her to fall, landing on right side, c/o pain to right hip area, cms intact distal, pain is worse when she bears weight on leg,

## 2013-09-05 NOTE — ED Notes (Signed)
Dr Lacinda Axon at bedside,

## 2013-09-08 ENCOUNTER — Other Ambulatory Visit: Payer: Self-pay | Admitting: Cardiovascular Disease

## 2013-09-09 ENCOUNTER — Ambulatory Visit (INDEPENDENT_AMBULATORY_CARE_PROVIDER_SITE_OTHER): Payer: Medicare Other | Admitting: *Deleted

## 2013-09-09 DIAGNOSIS — I4891 Unspecified atrial fibrillation: Secondary | ICD-10-CM | POA: Diagnosis not present

## 2013-09-09 DIAGNOSIS — I10 Essential (primary) hypertension: Secondary | ICD-10-CM | POA: Diagnosis not present

## 2013-09-09 DIAGNOSIS — Z5181 Encounter for therapeutic drug level monitoring: Secondary | ICD-10-CM

## 2013-09-09 DIAGNOSIS — S32509A Unspecified fracture of unspecified pubis, initial encounter for closed fracture: Secondary | ICD-10-CM | POA: Diagnosis not present

## 2013-09-09 DIAGNOSIS — Z7901 Long term (current) use of anticoagulants: Secondary | ICD-10-CM | POA: Diagnosis not present

## 2013-09-09 LAB — POCT INR: INR: 2.7

## 2013-09-30 DIAGNOSIS — I10 Essential (primary) hypertension: Secondary | ICD-10-CM | POA: Diagnosis not present

## 2013-09-30 DIAGNOSIS — S32509A Unspecified fracture of unspecified pubis, initial encounter for closed fracture: Secondary | ICD-10-CM | POA: Diagnosis not present

## 2013-10-07 ENCOUNTER — Ambulatory Visit (INDEPENDENT_AMBULATORY_CARE_PROVIDER_SITE_OTHER): Payer: Medicare Other | Admitting: *Deleted

## 2013-10-07 DIAGNOSIS — I4891 Unspecified atrial fibrillation: Secondary | ICD-10-CM | POA: Diagnosis not present

## 2013-10-07 DIAGNOSIS — Z7901 Long term (current) use of anticoagulants: Secondary | ICD-10-CM

## 2013-10-07 DIAGNOSIS — Z5181 Encounter for therapeutic drug level monitoring: Secondary | ICD-10-CM

## 2013-10-07 LAB — POCT INR: INR: 2.5

## 2013-10-08 ENCOUNTER — Other Ambulatory Visit: Payer: Self-pay | Admitting: Cardiovascular Disease

## 2013-10-13 DIAGNOSIS — H524 Presbyopia: Secondary | ICD-10-CM | POA: Diagnosis not present

## 2013-11-02 DIAGNOSIS — S32509A Unspecified fracture of unspecified pubis, initial encounter for closed fracture: Secondary | ICD-10-CM | POA: Diagnosis not present

## 2013-11-04 ENCOUNTER — Encounter: Payer: Self-pay | Admitting: Cardiovascular Disease

## 2013-11-04 DIAGNOSIS — E785 Hyperlipidemia, unspecified: Secondary | ICD-10-CM | POA: Diagnosis not present

## 2013-11-04 DIAGNOSIS — R945 Abnormal results of liver function studies: Secondary | ICD-10-CM | POA: Diagnosis not present

## 2013-11-04 DIAGNOSIS — I10 Essential (primary) hypertension: Secondary | ICD-10-CM | POA: Diagnosis not present

## 2013-11-05 ENCOUNTER — Ambulatory Visit (INDEPENDENT_AMBULATORY_CARE_PROVIDER_SITE_OTHER): Payer: Medicare Other | Admitting: *Deleted

## 2013-11-05 DIAGNOSIS — Z7901 Long term (current) use of anticoagulants: Secondary | ICD-10-CM | POA: Diagnosis not present

## 2013-11-05 DIAGNOSIS — Z5181 Encounter for therapeutic drug level monitoring: Secondary | ICD-10-CM

## 2013-11-05 DIAGNOSIS — I4891 Unspecified atrial fibrillation: Secondary | ICD-10-CM

## 2013-11-05 LAB — POCT INR: INR: 1.9

## 2013-11-06 DIAGNOSIS — E876 Hypokalemia: Secondary | ICD-10-CM | POA: Diagnosis not present

## 2013-11-06 DIAGNOSIS — R945 Abnormal results of liver function studies: Secondary | ICD-10-CM | POA: Diagnosis not present

## 2013-11-06 DIAGNOSIS — E785 Hyperlipidemia, unspecified: Secondary | ICD-10-CM | POA: Diagnosis not present

## 2013-11-06 DIAGNOSIS — I10 Essential (primary) hypertension: Secondary | ICD-10-CM | POA: Diagnosis not present

## 2013-11-11 ENCOUNTER — Telehealth: Payer: Self-pay | Admitting: Cardiovascular Disease

## 2013-11-11 NOTE — Telephone Encounter (Signed)
PT AWARE OF LAB RESULTS./CY 

## 2013-11-11 NOTE — Telephone Encounter (Signed)
Patient is returning your call. Please call back.  °

## 2013-11-12 ENCOUNTER — Other Ambulatory Visit (HOSPITAL_COMMUNITY): Payer: Self-pay | Admitting: Internal Medicine

## 2013-11-12 DIAGNOSIS — K760 Fatty (change of) liver, not elsewhere classified: Secondary | ICD-10-CM

## 2013-11-13 ENCOUNTER — Other Ambulatory Visit: Payer: Self-pay | Admitting: Cardiovascular Disease

## 2013-11-16 ENCOUNTER — Ambulatory Visit (HOSPITAL_COMMUNITY)
Admission: RE | Admit: 2013-11-16 | Discharge: 2013-11-16 | Disposition: A | Discharge status: Home / Self Care | Payer: Medicare Other | Source: Ambulatory Visit | Attending: Internal Medicine | Admitting: Internal Medicine

## 2013-11-16 DIAGNOSIS — K7689 Other specified diseases of liver: Secondary | ICD-10-CM | POA: Diagnosis not present

## 2013-11-16 DIAGNOSIS — K760 Fatty (change of) liver, not elsewhere classified: Secondary | ICD-10-CM

## 2013-11-19 ENCOUNTER — Encounter (INDEPENDENT_AMBULATORY_CARE_PROVIDER_SITE_OTHER): Payer: Self-pay | Admitting: *Deleted

## 2013-11-26 ENCOUNTER — Ambulatory Visit (INDEPENDENT_AMBULATORY_CARE_PROVIDER_SITE_OTHER): Payer: Medicare Other | Admitting: *Deleted

## 2013-11-26 DIAGNOSIS — Z5181 Encounter for therapeutic drug level monitoring: Secondary | ICD-10-CM

## 2013-11-26 DIAGNOSIS — Z7901 Long term (current) use of anticoagulants: Secondary | ICD-10-CM

## 2013-11-26 DIAGNOSIS — I4891 Unspecified atrial fibrillation: Secondary | ICD-10-CM | POA: Diagnosis not present

## 2013-11-26 LAB — POCT INR: INR: 1.6

## 2013-12-03 ENCOUNTER — Ambulatory Visit (INDEPENDENT_AMBULATORY_CARE_PROVIDER_SITE_OTHER): Payer: Medicare Other | Admitting: *Deleted

## 2013-12-03 DIAGNOSIS — Z7901 Long term (current) use of anticoagulants: Secondary | ICD-10-CM | POA: Diagnosis not present

## 2013-12-03 DIAGNOSIS — Z5181 Encounter for therapeutic drug level monitoring: Secondary | ICD-10-CM

## 2013-12-03 DIAGNOSIS — I4891 Unspecified atrial fibrillation: Secondary | ICD-10-CM | POA: Diagnosis not present

## 2013-12-03 LAB — POCT INR: INR: 2.8

## 2013-12-16 ENCOUNTER — Ambulatory Visit (INDEPENDENT_AMBULATORY_CARE_PROVIDER_SITE_OTHER): Payer: Medicare Other | Admitting: Internal Medicine

## 2013-12-16 ENCOUNTER — Encounter (INDEPENDENT_AMBULATORY_CARE_PROVIDER_SITE_OTHER): Payer: Self-pay | Admitting: Internal Medicine

## 2013-12-16 VITALS — BP 110/70 | HR 60 | Temp 97.6°F | Ht 65.0 in | Wt 123.0 lb

## 2013-12-16 DIAGNOSIS — I6529 Occlusion and stenosis of unspecified carotid artery: Secondary | ICD-10-CM

## 2013-12-16 DIAGNOSIS — R748 Abnormal levels of other serum enzymes: Secondary | ICD-10-CM | POA: Insufficient documentation

## 2013-12-16 NOTE — Patient Instructions (Signed)
CMET today. OV in 1 month

## 2013-12-16 NOTE — Progress Notes (Signed)
Subjective:    Patient ID: [name], female    DOB: 10/01/1940, 73 y.o.   MRN: [id number]  HPI Referred to our office for elevated liver enzymes.  Her Pravachol reduced to 20mg  3 weeks ago. Appetite good. No weight loss. No abdominal pain. BMs are moving normal. No melena or BRRB. No tattoos, No IV drug use.   11/04/2013 Hep C negative 11/16/2013 [place] RUQ: Gallbladder:  The gallbladder is adequately distended with no evidence of stones,  wall thickening, or pericholecystic fluid. A previously demonstrated  polyp is not evident today. There is no positive sonographic  Murphy's sign.  Common bile duct:  Diameter: 3.6 mm  Liver:  The liver exhibits normal echotexture with no focal mass nor ductal  dilation.  IMPRESSION:  Normal limited right upper quadrant ultrasound examination.   11/04/2013 AST 108, ALT 116,  ALP 113 10/2012 AST 32 ALT 43, ALP 80  Total protein 6.4, Albumin 3.7, Calcium 9.0. H and H 12.8 and 38.5, MCV 95.8 Review of Systems    Current outpatient prescriptions:acetaminophen (TYLENOL) 500 MG tablet, Take 500 mg by mouth every 6 (six) hours as needed for pain., Disp: , Rfl: ;  alendronate (FOSAMAX) 70 MG tablet, Take 70 mg by mouth every 7 (seven) days. Take in the morning with a full glass of water, on an empty stomach, and do not take anything else by mouth or lie down for the next 30 min. Patient take on Sunday, Disp: , Rfl:  amiodarone (PACERONE) 200 MG tablet, Take 1 tablet by mouth  daily, Disp: 90 tablet, Rfl: 0;  amLODipine (NORVASC) 10 MG tablet, Take 10 mg by mouth daily., Disp: , Rfl: ;  aspirin EC 81 MG tablet, Take 81 mg by mouth daily.  , Disp: , Rfl: ;  calcium-vitamin D (OSCAL) 250-125 MG-UNIT per tablet, Take 1 tablet by mouth 2 (two) times daily.  , Disp: , Rfl:  clopidogrel (PLAVIX) 75 MG tablet, Take 1 tablet by mouth  every morning, Disp: 30 tablet, Rfl: 3;  HYDROcodone-acetaminophen (NORCO) 5-325 MG per tablet, Take 1 tablet by mouth every 6 (six)  hours as needed for moderate pain. pain, Disp: , Rfl: ;  irbesartan (AVAPRO) 300 MG tablet, Take 300 mg by mouth daily., Disp: , Rfl: ;  Iron-FA-B Cmp-C-Biot-Probiotic (FUSION PLUS) CAPS, Take 1 capsule by mouth daily., Disp: , Rfl:  magnesium oxide (MAG-OX) 400 MG tablet, Take 400 mg by mouth daily., Disp: , Rfl: ;  meloxicam (MOBIC) 15 MG tablet, Take 15 mg by mouth every morning.  , Disp: , Rfl: ;  metoprolol tartrate (LOPRESSOR) 25 MG tablet, Take 3 tablets (75 mg  total) by mouth 2 (two)  times daily., Disp: 540 tablet, Rfl: 0;  Multiple Vitamin (MULTIVITAMIN) capsule, Take 1 capsule by mouth daily.  , Disp: , Rfl:  Omega-3 Fatty Acids (FISH OIL) 1200 MG CAPS, Take 1,200 mg by mouth 2 (two) times daily., Disp: , Rfl: ;  pravastatin (PRAVACHOL) 20 MG tablet, Take 20 mg by mouth daily., Disp: , Rfl: ;  vitamin C (ASCORBIC ACID) 500 MG tablet, Take 500 mg by mouth daily., Disp: , Rfl: ;  warfarin (COUMADIN) 4 MG tablet, Take 2-4 mg by mouth daily. Takes 4 mg on Monday and 2 mg on all other days of the week., Disp: , Rfl:  WELCHOL 625 MG tablet, Take 3 tablets by mouth  twice a day with a meal, Disp: 180 tablet, Rfl: 3  Objective:   Physical Exam  Filed Vitals:   12/16/13 1103  BP: 110/70  Pulse: 60  Temp: 97.6 F (36.4 C)  Height: 5\' 5"  (1.651 m)  Weight: 123 lb (55.792 kg)   Alert and oriented. Skin warm and dry. Oral mucosa is moist.   . Sclera anicteric, conjunctivae is pink. Thyroid not enlarged. No cervical lymphadenopathy. Lungs clear. Heart regular rate and rhythm.  Abdomen is soft. Bowel sounds are positive. No hepatomegaly. No abdominal masses felt. No tenderness.  1+ edema to lower extremities.          Assessment & Plan:  Elevated liver enzymes with normal US of the liver. ? Drug induced. Pravachol has been reduced. Will repeat CMET fasting. Further recommendations once we have back.  If normal, will repeat in 6 months.

## 2013-12-17 ENCOUNTER — Ambulatory Visit (INDEPENDENT_AMBULATORY_CARE_PROVIDER_SITE_OTHER): Payer: Medicare Other | Admitting: *Deleted

## 2013-12-17 DIAGNOSIS — Z7901 Long term (current) use of anticoagulants: Secondary | ICD-10-CM

## 2013-12-17 DIAGNOSIS — I4891 Unspecified atrial fibrillation: Secondary | ICD-10-CM | POA: Diagnosis not present

## 2013-12-17 DIAGNOSIS — R748 Abnormal levels of other serum enzymes: Secondary | ICD-10-CM | POA: Diagnosis not present

## 2013-12-17 DIAGNOSIS — Z5181 Encounter for therapeutic drug level monitoring: Secondary | ICD-10-CM

## 2013-12-17 DIAGNOSIS — I480 Paroxysmal atrial fibrillation: Secondary | ICD-10-CM

## 2013-12-17 LAB — COMPREHENSIVE METABOLIC PANEL
ALT: 57 U/L — AB (ref 0–35)
AST: 53 U/L — ABNORMAL HIGH (ref 0–37)
Albumin: 3.7 g/dL (ref 3.5–5.2)
Alkaline Phosphatase: 103 U/L (ref 39–117)
BUN: 23 mg/dL (ref 6–23)
CO2: 25 mEq/L (ref 19–32)
Calcium: 9 mg/dL (ref 8.4–10.5)
Chloride: 106 mEq/L (ref 96–112)
Creat: 0.84 mg/dL (ref 0.50–1.10)
Glucose, Bld: 87 mg/dL (ref 70–99)
Potassium: 4.8 mEq/L (ref 3.5–5.3)
SODIUM: 137 meq/L (ref 135–145)
TOTAL PROTEIN: 6.2 g/dL (ref 6.0–8.3)
Total Bilirubin: 0.6 mg/dL (ref 0.2–1.2)

## 2013-12-17 LAB — POCT INR: INR: 3.4

## 2013-12-18 ENCOUNTER — Encounter (INDEPENDENT_AMBULATORY_CARE_PROVIDER_SITE_OTHER): Payer: Self-pay | Admitting: *Deleted

## 2013-12-21 ENCOUNTER — Other Ambulatory Visit: Payer: Self-pay | Admitting: Cardiovascular Disease

## 2013-12-24 ENCOUNTER — Telehealth (INDEPENDENT_AMBULATORY_CARE_PROVIDER_SITE_OTHER): Payer: Self-pay | Admitting: *Deleted

## 2013-12-24 NOTE — Telephone Encounter (Signed)
[name] would like to get her lab results. The return phone number is [phone].

## 2013-12-25 ENCOUNTER — Telehealth (INDEPENDENT_AMBULATORY_CARE_PROVIDER_SITE_OTHER): Payer: Self-pay | Admitting: *Deleted

## 2013-12-25 DIAGNOSIS — R748 Abnormal levels of other serum enzymes: Secondary | ICD-10-CM

## 2013-12-25 NOTE — Telephone Encounter (Signed)
.  Per Lelon Perla lab is due in 3 months.

## 2013-12-28 ENCOUNTER — Encounter: Payer: Self-pay | Admitting: Cardiovascular Disease

## 2013-12-28 ENCOUNTER — Ambulatory Visit (INDEPENDENT_AMBULATORY_CARE_PROVIDER_SITE_OTHER): Payer: Medicare Other | Admitting: Cardiovascular Disease

## 2013-12-28 VITALS — BP 128/62 | HR 57 | Ht 62.0 in | Wt 120.0 lb

## 2013-12-28 DIAGNOSIS — I6529 Occlusion and stenosis of unspecified carotid artery: Secondary | ICD-10-CM

## 2013-12-28 DIAGNOSIS — I10 Essential (primary) hypertension: Secondary | ICD-10-CM

## 2013-12-28 DIAGNOSIS — E785 Hyperlipidemia, unspecified: Secondary | ICD-10-CM | POA: Diagnosis not present

## 2013-12-28 DIAGNOSIS — I4891 Unspecified atrial fibrillation: Secondary | ICD-10-CM | POA: Diagnosis not present

## 2013-12-28 DIAGNOSIS — I480 Paroxysmal atrial fibrillation: Secondary | ICD-10-CM

## 2013-12-28 DIAGNOSIS — R748 Abnormal levels of other serum enzymes: Secondary | ICD-10-CM

## 2013-12-28 NOTE — Assessment & Plan Note (Signed)
F/U [facility] no TIA;s  LICA 37-48%  Duplex in 6 months

## 2013-12-28 NOTE — Assessment & Plan Note (Signed)
Cholesterol is at goal.  Continue current dose of statin and diet Rx.  No myalgias or side effects.  F/U  LFT's in 6 months. Lab Results  Component Value Date   LDLCALC 60 08/11/2010   Labs with Dr Nevada Crane lft's improved with lower dose pravachol f/u GI

## 2013-12-28 NOTE — Assessment & Plan Note (Signed)
[place] with no pathology down to 50 range amiodarone and pravachol lowered  F/u GI

## 2013-12-28 NOTE — Telephone Encounter (Signed)
I have given patient lab results

## 2013-12-28 NOTE — Patient Instructions (Addendum)
Your physician wants you to follow-up in:  6 MONTHS WITH DR NISHAN  You will receive a reminder letter in the mail two months in advance. If you don't receive a letter, please call our office to schedule the follow-up appointment. Your physician recommends that you continue on your current medications as directed. Please refer to the Current Medication list given to you today. 

## 2013-12-28 NOTE — Assessment & Plan Note (Signed)
Well controlled.  Continue current medications and low sodium Dash type diet.    

## 2013-12-28 NOTE — Progress Notes (Signed)
Patient ID: [name], female   DOB: 11-03-1940, 73 y.o.   MRN: [id number] [name] is seen for F/U of PAF, bruit, right subclavian steal and anticoagulatin She has known vascular disease primarily in the sublavian and carotids. She was admitted with syncope last year . She had PAF with diastolic heart failure. Her cath showed only diagonal disease with no critical major vessel disease. EF was normal. 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 She has 60-79% stenosis on left ICA. In combination with rapid atrial fibrillation this may have contributed to a brief loss of consdousness. Her diastolic CHF cleared with restoration of NSR. She was sent home on low dose amiodarone. She has had no SOB, or palpitations since being home. there have been no bleeding problems.   Reviewed duplex from [facility]  4/27  06-23%  LICA stenosis. Innominate right occlusion with steal  Reviewed labs from last week and AST/ALT much better in 50 range down from 120  ROS: Denies fever, malais, weight loss, blurry vision, decreased visual acuity, cough, sputum, SOB, hemoptysis, pleuritic pain, palpitaitons, heartburn, abdominal pain, melena, lower extremity edema, claudication, or rash.  All other systems reviewed and negative  General: Affect appropriate Chronically ill female  HEENT: normal Neck supple with no adenopathy JVP normal bilateral subclavian bruits and carotid  bruits no thyromegaly Lungs clear with no wheezing and good diaphragmatic motion Heart:  S1/S2 no murmur, no rub, gallop or click PMI normal Abdomen: benighn, BS positve, no tenderness, no AAA no bruit.  No HSM or HJR Distal pulses intact with no bruits No edema Neuro non-focal Skin warm and dry bruising  No muscular weakness   Current Outpatient Prescriptions  Medication Sig Dispense Refill  . acetaminophen (TYLENOL) 500 MG tablet Take 500 mg by mouth every 6 (six) hours as needed  for pain.      Marland Kitchen alendronate (FOSAMAX) 70 MG tablet Take 70 mg by mouth every 7 (seven) days. Take in the morning with a full glass of water, on an empty stomach, and do not take anything else by mouth or lie down for the next 30 min. Patient take on Sunday      . amiodarone (PACERONE) 200 MG tablet Take 1 tablet by mouth  daily  30 tablet  6  . amLODipine (NORVASC) 10 MG tablet Take 10 mg by mouth daily.      Marland Kitchen aspirin EC 81 MG tablet Take 81 mg by mouth daily.        . calcium-vitamin D (OSCAL) 250-125 MG-UNIT per tablet Take 1 tablet by mouth 2 (two) times daily.        . clopidogrel (PLAVIX) 75 MG tablet Take 1 tablet by mouth  every morning  30 tablet  3  . HYDROcodone-acetaminophen (NORCO) 5-325 MG per tablet Take 1 tablet by mouth every 6 (six) hours as needed for moderate pain. pain      . irbesartan (AVAPRO) 300 MG tablet Take 300 mg by mouth daily.      . Iron-FA-B Cmp-C-Biot-Probiotic (FUSION PLUS) CAPS Take 1 capsule by mouth daily.      . magnesium oxide (MAG-OX) 400 MG tablet Take 400 mg by mouth daily.      . meloxicam (MOBIC) 15 MG tablet Take 15 mg by mouth every morning.        . metoprolol tartrate (LOPRESSOR) 25 MG tablet Take 3 tablets (75 mg  total) by mouth  2 (two)  times daily.  540 tablet  0  . Multiple Vitamin (MULTIVITAMIN) capsule Take 1 capsule by mouth daily.        . Omega-3 Fatty Acids (FISH OIL) 1200 MG CAPS Take 1,200 mg by mouth 2 (two) times daily.      . pravastatin (PRAVACHOL) 20 MG tablet Take 20 mg by mouth daily.      . vitamin C (ASCORBIC ACID) 500 MG tablet Take 500 mg by mouth daily.      Marland Kitchen warfarin (COUMADIN) 4 MG tablet Take 2-4 mg by mouth daily. Takes 4 mg on Monday and 2 mg on all other days of the week.      [name] 625 MG tablet Take 3 tablets by mouth  twice a day with a meal  180 tablet  3   No current facility-administered medications for this visit.    Allergies  Iodine; Iohexol; and Tape  Electrocardiogram:  SR PR 276  LV[facility]     Assessment and Plan

## 2013-12-28 NOTE — Assessment & Plan Note (Signed)
Good rate control and anticoagulation no bleeding issues

## 2014-01-07 ENCOUNTER — Ambulatory Visit (INDEPENDENT_AMBULATORY_CARE_PROVIDER_SITE_OTHER): Payer: Medicare Other | Admitting: *Deleted

## 2014-01-07 DIAGNOSIS — Z23 Encounter for immunization: Secondary | ICD-10-CM | POA: Diagnosis not present

## 2014-01-07 DIAGNOSIS — Z7901 Long term (current) use of anticoagulants: Secondary | ICD-10-CM

## 2014-01-07 DIAGNOSIS — I4891 Unspecified atrial fibrillation: Secondary | ICD-10-CM

## 2014-01-07 DIAGNOSIS — Z5181 Encounter for therapeutic drug level monitoring: Secondary | ICD-10-CM | POA: Diagnosis not present

## 2014-01-07 LAB — POCT INR: INR: 1.5

## 2014-01-14 ENCOUNTER — Ambulatory Visit (INDEPENDENT_AMBULATORY_CARE_PROVIDER_SITE_OTHER): Payer: Medicare Other | Admitting: Internal Medicine

## 2014-01-20 ENCOUNTER — Ambulatory Visit (INDEPENDENT_AMBULATORY_CARE_PROVIDER_SITE_OTHER): Payer: Medicare Other | Admitting: *Deleted

## 2014-01-20 DIAGNOSIS — Z7901 Long term (current) use of anticoagulants: Secondary | ICD-10-CM | POA: Diagnosis not present

## 2014-01-20 DIAGNOSIS — I4891 Unspecified atrial fibrillation: Secondary | ICD-10-CM

## 2014-01-20 DIAGNOSIS — Z5181 Encounter for therapeutic drug level monitoring: Secondary | ICD-10-CM | POA: Diagnosis not present

## 2014-01-20 LAB — POCT INR: INR: 4.7

## 2014-01-27 ENCOUNTER — Ambulatory Visit (INDEPENDENT_AMBULATORY_CARE_PROVIDER_SITE_OTHER): Payer: Medicare Other | Admitting: *Deleted

## 2014-01-27 DIAGNOSIS — I4891 Unspecified atrial fibrillation: Secondary | ICD-10-CM

## 2014-01-27 DIAGNOSIS — Z5181 Encounter for therapeutic drug level monitoring: Secondary | ICD-10-CM

## 2014-01-27 DIAGNOSIS — Z7901 Long term (current) use of anticoagulants: Secondary | ICD-10-CM

## 2014-01-27 LAB — POCT INR: INR: 1.9

## 2014-01-31 ENCOUNTER — Other Ambulatory Visit: Payer: Self-pay | Admitting: Cardiovascular Disease

## 2014-02-01 DIAGNOSIS — S32509D Unspecified fracture of unspecified pubis, subsequent encounter for fracture with routine healing: Secondary | ICD-10-CM | POA: Diagnosis not present

## 2014-02-03 ENCOUNTER — Ambulatory Visit (INDEPENDENT_AMBULATORY_CARE_PROVIDER_SITE_OTHER): Payer: Medicare Other | Admitting: *Deleted

## 2014-02-03 DIAGNOSIS — Z5181 Encounter for therapeutic drug level monitoring: Secondary | ICD-10-CM | POA: Diagnosis not present

## 2014-02-03 DIAGNOSIS — I4891 Unspecified atrial fibrillation: Secondary | ICD-10-CM

## 2014-02-03 LAB — POCT INR: INR: 1.9

## 2014-02-12 ENCOUNTER — Encounter: Payer: Self-pay | Admitting: Family

## 2014-02-12 DIAGNOSIS — M545 Low back pain: Secondary | ICD-10-CM | POA: Diagnosis not present

## 2014-02-12 DIAGNOSIS — Z23 Encounter for immunization: Secondary | ICD-10-CM | POA: Diagnosis not present

## 2014-02-15 ENCOUNTER — Ambulatory Visit (INDEPENDENT_AMBULATORY_CARE_PROVIDER_SITE_OTHER): Payer: Medicare Other | Admitting: Family

## 2014-02-15 ENCOUNTER — Encounter: Payer: Self-pay | Admitting: Family

## 2014-02-15 ENCOUNTER — Ambulatory Visit (HOSPITAL_COMMUNITY)
Admission: RE | Admit: 2014-02-15 | Discharge: 2014-02-15 | Disposition: A | Discharge status: Home / Self Care | Payer: Medicare Other | Source: Ambulatory Visit | Attending: Family | Admitting: Family

## 2014-02-15 VITALS — BP 106/62 | HR 65 | Resp 16 | Ht 65.0 in | Wt 115.0 lb

## 2014-02-15 DIAGNOSIS — I6523 Occlusion and stenosis of bilateral carotid arteries: Secondary | ICD-10-CM | POA: Insufficient documentation

## 2014-02-15 DIAGNOSIS — I6529 Occlusion and stenosis of unspecified carotid artery: Secondary | ICD-10-CM | POA: Diagnosis not present

## 2014-02-15 NOTE — Progress Notes (Signed)
Established Carotid Patient   History of Present Illness  [name] is a 73 y.o. female  patient of Dr. Trula Slade with no history of carotid intervention comes in for carotid duplex surveillance, has known right innominate artery occlusion. She denies tingling, numbness, pain or weakness in either hand or arm. Patient has orthostatic hypotension if stands up quickly, but denies dizziness otherwise. Patient denies history of amaurosis fugax, denies hemiplegia. She had a stroke in May, 2008 as manifested by dysphagia and aphasia which have resolved. She has a history of atrial fib followed by [name].   Reports New Medical or Surgical History: ESI's earlier this year for spinal stenosis and scoliosis, OA of the spine, DDD, and sciatic pain that is intermittent. She is in a wheelchair due to her back pain, taking Flexeril and OxyContin prn, prescribed by her orthopedist.  She has seen Dr. Hall Busing, neurologist, in the past. Her appetite is not good due to back pain, denies chronic weight loss. She will be having her liver evaluated.   Pt Diabetic: No Pt smoker: former smoker, quit 2008  Pt meds include: Statin : Yes Betablocker: yes ASA: Yes Other anticoagulants/antiplatelets: coumadin for atrial fib, also taking Plavix  Past Medical History  Diagnosis Date  . Subclavian steal syndrome   . Carotid bruit     LICA 54-09% (duplex 8/11)  . Hypertension   . Hyperlipidemia   . Atrial fibrillation     x 3 yrs  . Anticoagulation management encounter   . Meningioma   . Osteoporosis   . Stroke     CVA 2008  . Pneumonia   . Breast cancer     S/P left mastectomy, 1989 remained in remission    Social History History  Substance Use Topics  . Smoking status: Former Smoker -- 1.00 packs/day for 56 years    Types: Cigarettes    Quit date: 02/14/2007  . Smokeless tobacco: Never Used     Comment: x 4 yrs.  . Alcohol Use: Yes     Comment: occasional alcohol    Family  History Family History  Problem Relation Age of Onset  . Alzheimer's disease Mother   . Dementia Mother   . Parkinsonism Father     Surgical History Past Surgical History  Procedure Laterality Date  . Cardiac catheterization    . Mastectomy Left 1990  . Cataract extraction Bilateral 2013  . Colonoscopy  11/17/2010    Procedure: COLONOSCOPY;  Surgeon: [name], MD;  [facility]: [facility];  Service: Endoscopy;  Laterality: N/A;  10:45 am  . Esophagogastroduodenoscopy  11/17/2010    Procedure: ESOPHAGOGASTRODUODENOSCOPY (EGD);  Surgeon: [name], MD;  [facility];  Service: Endoscopy;  Laterality: N/A;  . Lumbar epidural injection  06-2012--06-2013    pt. states she has had 5 epidurals in 06-2012----06-2013    Allergies  Allergen Reactions  . Iodine Rash and Other (See Comments)    BETADINE Rash/burning, blisters on skin.  . Iohexol Rash and Other (See Comments)    Blisters; PT NEEDS 13-HOUR PREP   . Tape Itching and Rash    Prefers PAPER TAPE    Current Outpatient Prescriptions  Medication Sig Dispense Refill  . acetaminophen (TYLENOL) 500 MG tablet Take 500 mg by mouth every 6 (six) hours as needed for pain.    Marland Kitchen alendronate (FOSAMAX) 70 MG tablet Take 70 mg by mouth every 7 (seven) days. Take in the morning with a full glass of water,  on an empty stomach, and do not take anything else by mouth or lie down for the next 30 min. Patient take on Sunday    . amiodarone (PACERONE) 200 MG tablet Take 1 tablet by mouth  daily 30 tablet 6  . amLODipine (NORVASC) 10 MG tablet Take 10 mg by mouth daily.    Marland Kitchen aspirin EC 81 MG tablet Take 81 mg by mouth daily.      . calcium-vitamin D (OSCAL) 250-125 MG-UNIT per tablet Take 1 tablet by mouth 2 (two) times daily.      . clopidogrel (PLAVIX) 75 MG tablet Take 1 tablet by mouth  every morning 30 tablet 3  . HYDROcodone-acetaminophen (NORCO) 5-325 MG per tablet Take 1 tablet by mouth every 6 (six) hours as needed  for moderate pain. pain    . irbesartan (AVAPRO) 300 MG tablet Take 300 mg by mouth daily.    . Iron-FA-B Cmp-C-Biot-Probiotic (FUSION PLUS) CAPS Take 1 capsule by mouth daily.    . magnesium oxide (MAG-OX) 400 MG tablet Take 400 mg by mouth daily.    . meloxicam (MOBIC) 15 MG tablet Take 15 mg by mouth every morning.      . metoprolol tartrate (LOPRESSOR) 25 MG tablet Take 3 tablets (75 mg  total) by mouth 2 (two)  times daily. 540 tablet 1  . Multiple Vitamin (MULTIVITAMIN) capsule Take 1 capsule by mouth daily.      . Omega-3 Fatty Acids (FISH OIL) 1200 MG CAPS Take 1,200 mg by mouth 2 (two) times daily.    . pravastatin (PRAVACHOL) 20 MG tablet Take 20 mg by mouth daily.    . vitamin C (ASCORBIC ACID) 500 MG tablet Take 500 mg by mouth daily.    Marland Kitchen warfarin (COUMADIN) 4 MG tablet Take 2-4 mg by mouth daily. Takes 4 mg on Monday and 2 mg on all other days of the week.    [name] 625 MG tablet Take 3 tablets by mouth  twice a day with a meal 180 tablet 3   No current facility-administered medications for this visit.    Review of Systems : See HPI for pertinent positives and negatives.  Physical Examination  Filed Vitals:   02/15/14 1010  BP: 106/62  Pulse: 65  Resp: 16  Height: 5\' 5"  (1.651 m)  Weight: 115 lb (52.164 kg)  SpO2: 97%   Body mass index is 19.14 kg/(m^2).  General: WDWN thin female in NAD GAIT: seated in wheelchair Eyes: PERRLA Pulmonary: Diminished air movement in all fields, no rales, rhonchi or wheezing. Loose cough.  Cardiac: regular Rhythm.  VASCULAR EXAM Carotid Bruits Left Right   Positive Positive    Aorta is not palpable. Radial pulses are 2+ palpable and equal.      LE Pulses LEFT RIGHT   FEMORAL  palpable  palpable    POPLITEAL not palpable   palpable   POSTERIOR TIBIAL   palpable   palpable    DORSALIS PEDIS  ANTERIOR TIBIAL palpable  palpable     Gastrointestinal: soft, nontender, BS WNL, no r/g, no palpable masses.  Musculoskeletal: Negative muscle atrophy/wasting. M/S 4/5 throughout, Extremities without ischemic changes.  Legs and arms have multiple bruises secondary to coumadin use.  Neurologic: A&O X 3; Appropriate Affect ; SENSATION ;normal;  Speech is normal CN 2-12 intact, Pain and light touch intact in extremities, Motor exam as listed above.    Non-Invasive Vascular Imaging CAROTID DUPLEX 02/15/2014   CEREBROVASCULAR DUPLEX EVALUATION  INDICATION: Follow-up carotid disease; known right innominate occlusion    PREVIOUS INTERVENTION(S):     DUPLEX EXAM:     RIGHT  LEFT  Peak Systolic Velocities (cm/s) End Diastolic Velocities (cm/s) Plaque LOCATION Peak Systolic Velocities (cm/s) End Diastolic Velocities (cm/s) Plaque  22 7  CCA PROXIMAL 145 28   34 9  CCA MID 93 22   27 0  CCA DISTAL 88 26   28 8   ECA 106 14   30 0  ICA PROXIMAL 221 51 HT/CP  44 0  ICA MID 217 51   38 0  ICA DISTAL 138 30     NA ICA / CCA Ratio (PSV) 2.5  Retrograde  Vertebral Flow Abnormal antegrade   Brachial Systolic Pressure (mmHg)   Fed by vertebral artery   Brachial Artery Waveforms Within normal limits     Plaque Morphology:  HM = Homogeneous, HT = Heterogeneous, CP = Calcific Plaque, SP = Smooth Plaque, IP = Irregular Plaque     ADDITIONAL FINDINGS: Left subclavian artery velocity: 366cm/second with post stenotic turbulence present.    IMPRESSION: 1. Evidence of <40% disease of the right internal carotid artery. 2. Waveforms throughout the right side are monophasic due to known innominate occlusion; however, in the internal carotid artery there is retrograde flow at end diastole.  3. Evidence of 40%-59% stenosis of the left internal carotid artery. 4. Right vertebral artery is retrograde due to steal; right subclavian artery  is abnormal. 5. Left vertebral artery is abnormal; left subclavian artery is stenotic.    Compared to the previous exam:  Left subclavian artery stenosis was not previously mentioned.     Assessment: LAMICA MCCART is a 73 y.o. female who has known right innominate artery occlusion. She denies tingling, numbness, pain or weakness in either hand or arm. Patient has orthostatic hypotension if stands up quickly, but denies dizziness otherwise. Patient denies history of amaurosis fugax, denies hemiplegia. She had a stroke in May, 2008 as manifested by dysphagia and aphasia which have resolved. Today's carotid Duplex reveals <40% disease of the right internal carotid artery. Waveforms throughout the right side are monophasic due to known innominate occlusion; however, in the internal carotid artery there is retrograde flow at end diastole.  Evidence of 40%-59% stenosis of the left internal carotid artery. Right vertebral artery is retrograde due to steal; right subclavian artery is abnormal. Left vertebral artery is abnormal; left subclavian artery is stenotic.  The  ICA stenosis is  Unchanged from previous exam. Left subclavian artery stenosis was not previously mentioned.  Plan: Follow-up in 6 months with Carotid Duplex scan.   I discussed in depth with the patient the nature of atherosclerosis, and emphasized the importance of maximal medical management including strict control of blood pressure, blood glucose, and lipid levels, obtaining regular exercise, and continued cessation of smoking.  The patient is aware that without maximal medical management the underlying atherosclerotic disease process will progress, limiting the benefit of any interventions. The patient was given information about stroke prevention and what symptoms should prompt the patient to seek immediate medical care. Thank you for allowing [place] to participate in this patient's care.  Clemon Chambers, RN, MSN, FNP-C [facility] Office: [phone]  Clinic Physician: Trula Slade  02/15/2014 9:41 AM

## 2014-02-15 NOTE — Patient Instructions (Signed)
Stroke Prevention Some medical conditions and behaviors are associated with an increased chance of having a stroke. You may prevent a stroke by making healthy choices and managing medical conditions. HOW CAN I REDUCE MY RISK OF HAVING A STROKE?   Stay physically active. Get at least 30 minutes of activity on most or all days.  Do not smoke. It may also be helpful to avoid exposure to secondhand smoke.  Limit alcohol use. Moderate alcohol use is considered to be:  No more than 2 drinks per day for men.  No more than 1 drink per day for nonpregnant women.  Eat healthy foods. This involves:  Eating 5 or more servings of fruits and vegetables a day.  Making dietary changes that address high blood pressure (hypertension), high cholesterol, diabetes, or obesity.  Manage your cholesterol levels.  Making food choices that are high in fiber and low in saturated fat, trans fat, and cholesterol may control cholesterol levels.  Take any prescribed medicines to control cholesterol as directed by your health care provider.  Manage your diabetes.  Controlling your carbohydrate and sugar intake is recommended to manage diabetes.  Take any prescribed medicines to control diabetes as directed by your health care provider.  Control your hypertension.  Making food choices that are low in salt (sodium), saturated fat, trans fat, and cholesterol is recommended to manage hypertension.  Take any prescribed medicines to control hypertension as directed by your health care provider.  Maintain a healthy weight.  Reducing calorie intake and making food choices that are low in sodium, saturated fat, trans fat, and cholesterol are recommended to manage weight.  Stop drug abuse.  Avoid taking birth control pills.  Talk to your health care provider about the risks of taking birth control pills if you are over 35 years old, smoke, get migraines, or have ever had a blood clot.  Get evaluated for sleep  disorders (sleep apnea).  Talk to your health care provider about getting a sleep evaluation if you snore a lot or have excessive sleepiness.  Take medicines only as directed by your health care provider.  For some people, aspirin or blood thinners (anticoagulants) are helpful in reducing the risk of forming abnormal blood clots that can lead to stroke. If you have the irregular heart rhythm of atrial fibrillation, you should be on a blood thinner unless there is a good reason you cannot take them.  Understand all your medicine instructions.  Make sure that other conditions (such as anemia or atherosclerosis) are addressed. SEEK IMMEDIATE MEDICAL CARE IF:   You have sudden weakness or numbness of the face, arm, or leg, especially on one side of the body.  Your face or eyelid droops to one side.  You have sudden confusion.  You have trouble speaking (aphasia) or understanding.  You have sudden trouble seeing in one or both eyes.  You have sudden trouble walking.  You have dizziness.  You have a loss of balance or coordination.  You have a sudden, severe headache with no known cause.  You have new chest pain or an irregular heartbeat. Any of these symptoms may represent a serious problem that is an emergency. Do not wait to see if the symptoms will go away. Get medical help at once. Call your local emergency services (911 in [place] Do not drive yourself to the hospital. Document Released: 05/03/2004 Document Revised: 08/10/2013 Document Reviewed: 09/26/2012 Exit[employer] Patient Information 2015 [employer]. This information is not intended to replace advice given   to you by your health care provider. Make sure you discuss any questions you have with your health care provider.  

## 2014-02-17 ENCOUNTER — Ambulatory Visit (INDEPENDENT_AMBULATORY_CARE_PROVIDER_SITE_OTHER): Payer: Medicare Other | Admitting: *Deleted

## 2014-02-17 DIAGNOSIS — Z5181 Encounter for therapeutic drug level monitoring: Secondary | ICD-10-CM | POA: Diagnosis not present

## 2014-02-17 DIAGNOSIS — I4891 Unspecified atrial fibrillation: Secondary | ICD-10-CM | POA: Diagnosis not present

## 2014-02-17 LAB — POCT INR: INR: 5.5

## 2014-02-19 DIAGNOSIS — M47816 Spondylosis without myelopathy or radiculopathy, lumbar region: Secondary | ICD-10-CM | POA: Diagnosis not present

## 2014-02-19 DIAGNOSIS — M5126 Other intervertebral disc displacement, lumbar region: Secondary | ICD-10-CM | POA: Diagnosis not present

## 2014-02-22 DIAGNOSIS — S22089A Unspecified fracture of T11-T12 vertebra, initial encounter for closed fracture: Secondary | ICD-10-CM | POA: Diagnosis not present

## 2014-02-24 ENCOUNTER — Other Ambulatory Visit: Payer: Self-pay | Admitting: Cardiovascular Disease

## 2014-02-24 ENCOUNTER — Encounter (INDEPENDENT_AMBULATORY_CARE_PROVIDER_SITE_OTHER): Payer: Self-pay | Admitting: *Deleted

## 2014-02-24 ENCOUNTER — Ambulatory Visit (INDEPENDENT_AMBULATORY_CARE_PROVIDER_SITE_OTHER): Payer: Medicare Other | Admitting: *Deleted

## 2014-02-24 ENCOUNTER — Other Ambulatory Visit (INDEPENDENT_AMBULATORY_CARE_PROVIDER_SITE_OTHER): Payer: Self-pay | Admitting: *Deleted

## 2014-02-24 DIAGNOSIS — Z5181 Encounter for therapeutic drug level monitoring: Secondary | ICD-10-CM

## 2014-02-24 DIAGNOSIS — I4891 Unspecified atrial fibrillation: Secondary | ICD-10-CM | POA: Diagnosis not present

## 2014-02-24 DIAGNOSIS — R748 Abnormal levels of other serum enzymes: Secondary | ICD-10-CM

## 2014-02-24 LAB — POCT INR: INR: >8

## 2014-02-24 LAB — PROTIME-INR
INR: 6.36 (ref <1.50)
PROTHROMBIN TIME: 56.4 s — AB (ref 11.6–15.2)

## 2014-03-01 ENCOUNTER — Ambulatory Visit (INDEPENDENT_AMBULATORY_CARE_PROVIDER_SITE_OTHER): Payer: Medicare Other | Admitting: *Deleted

## 2014-03-01 DIAGNOSIS — Z5181 Encounter for therapeutic drug level monitoring: Secondary | ICD-10-CM

## 2014-03-01 DIAGNOSIS — I4891 Unspecified atrial fibrillation: Secondary | ICD-10-CM

## 2014-03-01 LAB — POCT INR: INR: 3.3

## 2014-03-01 MED ORDER — WARFARIN SODIUM 1 MG PO TABS: 1.0000 mg | ORAL_TABLET | Freq: Once | ORAL | Status: DC

## 2014-03-05 ENCOUNTER — Other Ambulatory Visit: Payer: Self-pay | Admitting: Cardiovascular Disease

## 2014-03-08 ENCOUNTER — Ambulatory Visit (INDEPENDENT_AMBULATORY_CARE_PROVIDER_SITE_OTHER): Payer: Medicare Other | Admitting: *Deleted

## 2014-03-08 DIAGNOSIS — Z5181 Encounter for therapeutic drug level monitoring: Secondary | ICD-10-CM

## 2014-03-08 DIAGNOSIS — I4891 Unspecified atrial fibrillation: Secondary | ICD-10-CM

## 2014-03-08 LAB — POCT INR: INR: 2.9

## 2014-03-15 ENCOUNTER — Other Ambulatory Visit: Payer: Self-pay | Admitting: *Deleted

## 2014-03-15 ENCOUNTER — Ambulatory Visit (INDEPENDENT_AMBULATORY_CARE_PROVIDER_SITE_OTHER): Payer: Medicare Other | Admitting: *Deleted

## 2014-03-15 DIAGNOSIS — Z5181 Encounter for therapeutic drug level monitoring: Secondary | ICD-10-CM | POA: Diagnosis not present

## 2014-03-15 DIAGNOSIS — I4891 Unspecified atrial fibrillation: Secondary | ICD-10-CM

## 2014-03-15 LAB — POCT INR: INR: 2.3

## 2014-03-15 MED ORDER — CLOPIDOGREL BISULFATE 75 MG PO TABS: ORAL_TABLET | ORAL | Status: DC

## 2014-03-16 DIAGNOSIS — S22089D Unspecified fracture of T11-T12 vertebra, subsequent encounter for fracture with routine healing: Secondary | ICD-10-CM | POA: Diagnosis not present

## 2014-03-25 ENCOUNTER — Encounter (INDEPENDENT_AMBULATORY_CARE_PROVIDER_SITE_OTHER): Payer: Self-pay

## 2014-03-26 DIAGNOSIS — R748 Abnormal levels of other serum enzymes: Secondary | ICD-10-CM | POA: Diagnosis not present

## 2014-03-26 LAB — COMPREHENSIVE METABOLIC PANEL
ALBUMIN: 3 g/dL — AB (ref 3.5–5.2)
ALT: 37 U/L — AB (ref 0–35)
AST: 44 U/L — AB (ref 0–37)
Alkaline Phosphatase: 116 U/L (ref 39–117)
BUN: 20 mg/dL (ref 6–23)
CALCIUM: 9.2 mg/dL (ref 8.4–10.5)
CHLORIDE: 101 meq/L (ref 96–112)
CO2: 24 meq/L (ref 19–32)
Creat: 0.67 mg/dL (ref 0.50–1.10)
Glucose, Bld: 89 mg/dL (ref 70–99)
POTASSIUM: 5.4 meq/L — AB (ref 3.5–5.3)
Sodium: 135 mEq/L (ref 135–145)
Total Bilirubin: 0.5 mg/dL (ref 0.2–1.2)
Total Protein: 7.1 g/dL (ref 6.0–8.3)

## 2014-03-29 ENCOUNTER — Ambulatory Visit (INDEPENDENT_AMBULATORY_CARE_PROVIDER_SITE_OTHER): Payer: Medicare Other | Admitting: *Deleted

## 2014-03-29 DIAGNOSIS — I4891 Unspecified atrial fibrillation: Secondary | ICD-10-CM | POA: Diagnosis not present

## 2014-03-29 DIAGNOSIS — Z5181 Encounter for therapeutic drug level monitoring: Secondary | ICD-10-CM

## 2014-03-29 LAB — POCT INR: INR: 1.5

## 2014-04-05 ENCOUNTER — Telehealth (INDEPENDENT_AMBULATORY_CARE_PROVIDER_SITE_OTHER): Payer: Self-pay | Admitting: *Deleted

## 2014-04-05 DIAGNOSIS — R748 Abnormal levels of other serum enzymes: Secondary | ICD-10-CM

## 2014-04-05 NOTE — Telephone Encounter (Signed)
.  Per Lelon Perla patient to have labs in 3 months, and fasting.

## 2014-04-05 NOTE — Telephone Encounter (Signed)
Per Provider cancel the Hepatic Profile as the C-met will have the results needed.

## 2014-04-05 NOTE — Addendum Note (Signed)
Addended by: Grayland Ormond on: 04/05/2014 12:26 PM   Modules accepted: Orders

## 2014-04-06 ENCOUNTER — Other Ambulatory Visit: Payer: Self-pay | Admitting: *Deleted

## 2014-04-06 ENCOUNTER — Encounter (INDEPENDENT_AMBULATORY_CARE_PROVIDER_SITE_OTHER): Payer: Self-pay | Admitting: *Deleted

## 2014-04-06 MED ORDER — WARFARIN SODIUM 1 MG PO TABS: 1.0000 mg | ORAL_TABLET | Freq: Once | ORAL | Status: DC

## 2014-04-12 ENCOUNTER — Ambulatory Visit (INDEPENDENT_AMBULATORY_CARE_PROVIDER_SITE_OTHER): Payer: Medicare Other | Admitting: *Deleted

## 2014-04-12 DIAGNOSIS — I4891 Unspecified atrial fibrillation: Secondary | ICD-10-CM | POA: Diagnosis not present

## 2014-04-12 DIAGNOSIS — Z5181 Encounter for therapeutic drug level monitoring: Secondary | ICD-10-CM | POA: Diagnosis not present

## 2014-04-12 LAB — POCT INR: INR: 1.3

## 2014-04-16 DIAGNOSIS — I10 Essential (primary) hypertension: Secondary | ICD-10-CM | POA: Diagnosis not present

## 2014-04-16 DIAGNOSIS — S22089D Unspecified fracture of T11-T12 vertebra, subsequent encounter for fracture with routine healing: Secondary | ICD-10-CM | POA: Diagnosis not present

## 2014-04-19 ENCOUNTER — Ambulatory Visit (INDEPENDENT_AMBULATORY_CARE_PROVIDER_SITE_OTHER): Payer: Medicare Other | Admitting: *Deleted

## 2014-04-19 DIAGNOSIS — I480 Paroxysmal atrial fibrillation: Secondary | ICD-10-CM

## 2014-04-19 DIAGNOSIS — I4891 Unspecified atrial fibrillation: Secondary | ICD-10-CM | POA: Diagnosis not present

## 2014-04-19 DIAGNOSIS — Z5181 Encounter for therapeutic drug level monitoring: Secondary | ICD-10-CM

## 2014-04-19 LAB — POCT INR: INR: 2.4

## 2014-04-21 ENCOUNTER — Ambulatory Visit (HOSPITAL_COMMUNITY)
Admission: RE | Admit: 2014-04-21 | Discharge: 2014-04-21 | Disposition: A | Discharge status: Home / Self Care | Payer: Medicare Other | Source: Ambulatory Visit | Attending: Orthopaedic Surgery | Admitting: Orthopaedic Surgery

## 2014-04-21 DIAGNOSIS — R262 Difficulty in walking, not elsewhere classified: Secondary | ICD-10-CM | POA: Insufficient documentation

## 2014-04-21 DIAGNOSIS — M6281 Muscle weakness (generalized): Secondary | ICD-10-CM | POA: Insufficient documentation

## 2014-04-21 DIAGNOSIS — R29898 Other symptoms and signs involving the musculoskeletal system: Secondary | ICD-10-CM

## 2014-04-21 DIAGNOSIS — R278 Other lack of coordination: Secondary | ICD-10-CM | POA: Insufficient documentation

## 2014-04-21 DIAGNOSIS — R2689 Other abnormalities of gait and mobility: Secondary | ICD-10-CM

## 2014-04-21 NOTE — Therapy (Signed)
[place], [place], [address] Phone: [phone]   Fax:  [phone]  Physical Therapy Evaluation  Patient Details  Name: [name] MRN: [id number] Date of Birth: 24-Jan-1941 Referring Provider:  Sanjuana Kava, MD  Encounter Date: 04/21/2014      PT End of Session - 04/21/14 0941    Visit Number 1   Number of Visits 12   Date for PT Re-Evaluation 06/20/14   Authorization Type medicare   PT Start Time 0805   PT Stop Time 0850   PT Time Calculation (min) 45 min   Activity Tolerance Patient tolerated treatment well      Past Medical History  Diagnosis Date  . Subclavian steal syndrome   . Carotid bruit     LICA 02-54% (duplex 2/70)  . Hypertension   . Hyperlipidemia   . Atrial fibrillation     x 3 yrs  . Anticoagulation management encounter   . Meningioma   . Osteoporosis   . Stroke     CVA 2008  . Pneumonia   . Breast cancer     S/P left mastectomy, 1989 remained in remission  . Anemia     Past Surgical History  Procedure Laterality Date  . Cardiac catheterization    . Mastectomy Left 1990  . Cataract extraction Bilateral 2013  . Colonoscopy  11/17/2010    Procedure: COLONOSCOPY;  Surgeon: [name], MD;  [facility];  Service: Endoscopy;  Laterality: N/A;  10:45 am  . Esophagogastroduodenoscopy  11/17/2010    Procedure: ESOPHAGOGASTRODUODENOSCOPY (EGD);  Surgeon: [name], MD;  [facility];  Service: Endoscopy;  Laterality: N/A;  . Lumbar epidural injection  06-2012--06-2013    pt. states she has had 5 epidurals in 06-2012----06-2013  . Eye surgery      There were no vitals taken for this visit.  Visit Diagnosis:  Leg weakness, bilateral  Poor balance  Difficulty walking      Subjective Assessment - 04/21/14 0806    Symptoms Ms. Caudell comes to our department complaining of back pain.  The pt slipped and fx her pelvic  bone a few months ago but  continued to have pain so x-rays were taken and it was shown that she  has a healing compression fracture of T11-T12.   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  She states that this compression fracture has thrown her for a loop as she is unable to walk.     Pertinent History Pt states that she was on her back for about six weeks and has lost a lot of her strength.    How long can you sit comfortably? 30 minutes   How long can you stand comfortably? unable    How long can you walk comfortably? only able to walk for about 30 ft. if she is behind her wheelchair pushing it;  without the wheelchair she is unable to walk    Patient Stated Goals To be able to stand up to iron even if it is only two shirts , walk and drive    Currently in Pain? Yes   Pain Score 4   worst pain in the past week is a 10/10;  lying down = 0/10    Pain Location Back   Pain  Orientation Lower   Pain Descriptors / Indicators Burning;Crushing   Pain Type Chronic pain   Pain Radiating Towards B thighs to knee level    Pain Onset More than a month ago   Pain Frequency Intermittent          [facility] PT Assessment - 04/21/14 0819    Assessment   Medical Diagnosis compression fx   Onset Date 01/07/14   Prior Therapy none   Precautions   Precautions Back   Restrictions   Weight Bearing Restrictions No   Balance Screen   Has the patient fallen in the past 6 months Yes   How many times? 1   Has the patient had a decrease in activity level because of a fear of falling?  Yes   Is the patient reluctant to leave their home because of a fear of falling?  Yes   Prior Function   Level of Independence Independent with homemaking with ambulation   Vocation Retired   Leisure household   Cognition   Overall Cognitive Status Within Functional Limits for tasks assessed   Posture/Postural Control   Postural  Limitations Rounded Shoulders;Forward head;Decreased lumbar lordosis   Strength   Right Hip Flexion 3+/5   Right Hip Extension 2+/5   Right Hip ABduction 3-/5   Left Hip Flexion 4/5   Left Hip Extension 2/5   Left Hip ABduction 2/5   Right Knee Flexion 3+/5   Right Knee Extension 5/5   Left Knee Flexion 3+/5   Left Knee Extension 5/5   Right Ankle Dorsiflexion 2/5   Right Ankle Plantar Flexion 2+/5   Left Ankle Dorsiflexion 2-/5   Left Ankle Plantar Flexion 2+/5   Balance   Balance Assessed --  Pt is only able to stand for 15 seconds without holdihg                   [facility] Adult PT Treatment/Exercise - 04/21/14 0819    Exercises   Exercises Lumbar   Lumbar Exercises: Seated   Other Seated Lumbar Exercises Ankle DF/ PF x 10   Lumbar Exercises: Supine   Bridge 10 reps   Other Supine Lumbar Exercises decompression ex    Lumbar Exercises: Sidelying   Hip Abduction 10 reps                  PT Short Term Goals - 04/21/14 0946    PT SHORT TERM GOAL #1   Title I in HEP   Time 1   Period Weeks   PT SHORT TERM GOAL #2   Title Pt to be able to stand for two minutes   Time 4   Period Weeks   PT SHORT TERM GOAL #3   Title Pt to be able to walk behind wheelchair for five minues   Time 4   Period Weeks   PT SHORT TERM GOAL #4   Title Pain level to be no greater than a 7./10           PT Long Term Goals - 04/21/14 0947    PT LONG TERM GOAL #1   Title I in advance HEP   Time 8   Period Weeks   PT LONG TERM GOAL #2   Title 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   Time 8   Period Weeks   PT LONG TERM GOAL #3   Title Pt to be able to walk in the house with  a cane    Time 8   Period Weeks   PT LONG TERM GOAL #4   Title Pain level to be no greater than a 510 80% of the time    Time 8   Period Weeks               Plan - 05-17-2014 6861    Clinical Impression Statement Pt is a 74 yo who has a hx of compression fx of  T11-T12 which has left her quite debilitated.  She can not stand longer than 15 seconds.  She needs a walker to walk 30 ft.  She has been referred and will benefit from skilled PT to address findings of decreased strength, decreased balance, and decreased tolerane to actiivity to maximize her functional capacity.    Pt will benefit from skilled therapeutic intervention in order to improve on the following deficits Decreased activity tolerance;Decreased balance;Decreased coordination;Decreased mobility;Pain;Difficulty walking;Decreased strength   Rehab Potential Good   PT Frequency 3x / week   PT Duration 8 weeks   PT Treatment/Interventions ADLs/Self Care Home Management;Functional mobility training;Therapeutic activities;Therapeutic exercise;Balance training;Patient/family education   PT Next Visit Plan Begin decompresion ex with t-band; clams, ab sets, w-back progress to prone glut sets, heel squeezes and SLR then progress to standing tolerance and activities.    PT Home Exercise Plan given          G-Codes - May 17, 2014 6837    Functional Limitation Other PT primary   Other PT Primary Current Status (G9021) At least 60 percent but less than 80 percent impaired, limited or restricted   Other PT Primary Goal Status (J1552) At least 40 percent but less than 60 percent impaired, limited or restricted       Problem List Patient Active Problem List   Diagnosis Date Noted  . Elevated liver enzymes 12/16/2013  . Tremor 06/24/2013  . Encounter for therapeutic drug monitoring 05/18/2013  . Dizziness 01/12/2013  . Wound of ankle 10/23/2010  . Long term current use of anticoagulant 07/05/2010  . Occlusion and stenosis of carotid artery without mention of cerebral infarction 11/01/2009  . HYPERLIPIDEMIA 10/26/2009  . HYPERTENSION 10/26/2009  . ATRIAL FIBRILLATION 10/26/2009  . ACUTE ON CHRONIC DIASTOLIC HEART FAILURE 11/09/2334  . SYNCOPE AND COLLAPSE 10/26/2009    RUSSELL,[name]  PT/CLT 05/17/2014, 9:50 AM  [place], [place], [address] Phone: [phone]   Fax:  [phone]

## 2014-04-21 NOTE — Patient Instructions (Signed)
Toe / Heel Raise (Sitting)   Sitting, raise heels, then rock back on heels and raise toes. Repeat ___15_ times.  Copyright  VHI. All rights reserved.  Bridging   Slowly raise buttocks from floor, keeping stomach tight. Repeat _10___ times per set. Do __1__ sets per session. Do ___2_ sessions per day.  http://orth.exer.us/1096   Copyright  VHI. All rights reserved.  Isometric Abdominal   Lying on back with knees bent, tighten stomach by pressing elbows down. Hold __5__ seconds. Repeat 10____ times per set. Do 1___ sets per session. Do __2__ sessions per day.  http://orth.exer.us/1086   Copyright  VHI. All rights reserved.  Strengthening: Hip Abduction (Side-Lying)   Tighten muscles on front of left thigh, then lift leg __10__ inches from surface, keeping knee locked.  Repeat ___10_ times per set. Do ___1_ sets per session. Do _2___ sessions per day.  http://orth.exer.us/622   Copyright  VHI. All rights reserved.

## 2014-04-23 ENCOUNTER — Ambulatory Visit (HOSPITAL_COMMUNITY)
Admission: RE | Admit: 2014-04-23 | Discharge: 2014-04-23 | Disposition: A | Discharge status: Home / Self Care | Payer: Medicare Other | Source: Ambulatory Visit | Attending: Orthopaedic Surgery | Admitting: Orthopaedic Surgery

## 2014-04-23 DIAGNOSIS — M6281 Muscle weakness (generalized): Secondary | ICD-10-CM | POA: Diagnosis not present

## 2014-04-23 DIAGNOSIS — R2689 Other abnormalities of gait and mobility: Secondary | ICD-10-CM

## 2014-04-23 DIAGNOSIS — R262 Difficulty in walking, not elsewhere classified: Secondary | ICD-10-CM | POA: Diagnosis not present

## 2014-04-23 DIAGNOSIS — R278 Other lack of coordination: Secondary | ICD-10-CM | POA: Diagnosis not present

## 2014-04-23 DIAGNOSIS — R29898 Other symptoms and signs involving the musculoskeletal system: Secondary | ICD-10-CM

## 2014-04-23 NOTE — Therapy (Signed)
[place], [place], [address] Phone: [phone]   Fax:  [phone]  Physical Therapy Treatment  Patient Details  Name: [name] MRN: [id number] Date of Birth: 1940-10-26 Referring Provider:  Sanjuana Kava, MD  Encounter Date: 04/23/2014      PT End of Session - 04/23/14 1051    Visit Number 2   Number of Visits 12   Date for PT Re-Evaluation 06/20/14   Authorization Type medicare   Authorization - Visit Number 1   Authorization - Number of Visits 10   PT Start Time 1012   PT Stop Time 1055   PT Time Calculation (min) 43 min   Activity Tolerance Patient limited by pain   Behavior During Therapy [facility] for tasks assessed/performed      Past Medical History  Diagnosis Date  . Subclavian steal syndrome   . Carotid bruit     LICA 13-24% (duplex 4/01)  . Hypertension   . Hyperlipidemia   . Atrial fibrillation     x 3 yrs  . Anticoagulation management encounter   . Meningioma   . Osteoporosis   . Stroke     CVA 2008  . Pneumonia   . Breast cancer     S/P left mastectomy, 1989 remained in remission  . Anemia     Past Surgical History  Procedure Laterality Date  . Cardiac catheterization    . Mastectomy Left 1990  . Cataract extraction Bilateral 2013  . Colonoscopy  11/17/2010    Procedure: COLONOSCOPY;  Surgeon: [name], MD;  [facility];  Service: Endoscopy;  Laterality: N/A;  10:45 am  . Esophagogastroduodenoscopy  11/17/2010    Procedure: ESOPHAGOGASTRODUODENOSCOPY (EGD);  Surgeon: [name], MD;  [facility];  Service: Endoscopy;  Laterality: N/A;  . Lumbar epidural injection  06-2012--06-2013    pt. states she has had 5 epidurals in 06-2012----06-2013  . Eye surgery      There were no vitals taken for this visit.  Visit Diagnosis:  Leg weakness, bilateral  Poor balance  Difficulty walking      Subjective Assessment - 04/23/14 1021    Symptoms Pt reprots compliance with HEP 2x/day with significant pain/muscle soreness today at 10/10 in (B) thighs.     Currently in Pain? Yes   Pain Score 10-Worst pain ever   Pain Location --  thighs   Pain Orientation Right;Left          [facility] PT Assessment - 04/23/14 0001    Flexibility   Soft Tissue Assessment /Muscle Lenght yes   Quadriceps Rt 8 in, Lt 6 in to glute                  [facility] Adult PT Treatment/Exercise - 04/23/14 1012    Exercises   Exercises Lumbar   Lumbar Exercises: Stretches   Lower Trunk Rotation Other (comment)   Lower Trunk Rotation Limitations x10   Quad Stretch 2 reps;20 seconds   Quad Stretch Limitations Manual stretch   Lumbar Exercises: Aerobic   Stationary [place] #2, Lv. 2 5' use of UE and LE   Lumbar Exercises: Seated   Long Arc Quad on Chair 2 sets;5 reps   LAQ on Chair Weights (lbs) 1   Other Seated Lumbar Exercises W-Back x10   Lumbar Exercises: Supine   Bridge 15 reps   Bridge Limitations 3x5   Straight Leg Raise 10 reps  Straight Leg Raises Limitations 2x5   Lumbar Exercises: Sidelying   Clam 15 reps   Clam Limitations 3x5, manual assist to maintain foot placement   Hip Abduction Limitations unable secondary to muscle soreness   Lumbar Exercises: Prone   Straight Leg Raise 10 reps   Straight Leg Raises Limitations 2x5   Other Prone Lumbar Exercises HS Curls #1, 2x5                  PT Short Term Goals - 04/21/14 0946    PT SHORT TERM GOAL #1   Title I in HEP   Time 1   Period Weeks   PT SHORT TERM GOAL #2   Title Pt to be able to stand for two minutes   Time 4   Period Weeks   PT SHORT TERM GOAL #3   Title Pt to be able to walk behind wheelchair for five minues   Time 4   Period Weeks   PT SHORT TERM GOAL #4   Title Pain level to be no greater than a 7./10           PT Long Term Goals - 04/21/14 0947    PT LONG TERM GOAL #1   Title I in advance HEP   Time 8   Period Weeks   PT  LONG TERM GOAL #2   Title 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   Time 8   Period Weeks   PT LONG TERM GOAL #3   Title Pt to be able to walk in the house with a cane    Time 8   Period Weeks   PT LONG TERM GOAL #4   Title Pain level to be no greater than a 510 80% of the time    Time 8   Period Weeks               Plan - 04/23/14 1052    Clinical Impression Statement PT POC initiated for lower body strengehning program.  Pt presents to PT with significant complaints of muscle soreness from HEP, though reports she is willing to try therex in clinic today.  Added quad strength today to address muscle soreness to (B) LE.  Pt was able to complete most exercises, though did require rest break after 5 reps before continueing.  Only unable to complete SLR ABD secondary to soreness, and transitioned to clam shells which pt was able to complete.       Pt will benefit from skilled therapeutic intervention in order to improve on the following deficits Decreased activity tolerance;Decreased balance;Decreased coordination;Decreased mobility;Pain;Difficulty walking;Decreased strength   Rehab Potential Good   PT Frequency 3x / week   PT Duration 8 weeks   PT Treatment/Interventions ADLs/Self Care Home Management;Functional mobility training;Therapeutic activities;Therapeutic exercise;Balance training;Patient/family education   PT Next Visit Plan Continue strengthening program, increasing activity tolerance with increased reps as able.  Progress to standing as tolerated.          Problem List Patient Active Problem List   Diagnosis Date Noted  . Elevated liver enzymes 12/16/2013  . Tremor 06/24/2013  . Encounter for therapeutic drug monitoring 05/18/2013  . Dizziness 01/12/2013  . Wound of ankle 10/23/2010  . Long term current use of anticoagulant 07/05/2010  . Occlusion and stenosis of carotid artery without mention of cerebral infarction 11/01/2009  .  HYPERLIPIDEMIA 10/26/2009  . HYPERTENSION 10/26/2009  . ATRIAL FIBRILLATION 10/26/2009  . ACUTE  ON CHRONIC DIASTOLIC HEART FAILURE 26/71/2458  . SYNCOPE AND COLLAPSE 10/26/2009    Lonna Cobb, DPT [phone]   04/23/2014, 10:54 AM  [place], [place], [address] Phone: [phone]   Fax:  [phone]

## 2014-04-26 ENCOUNTER — Ambulatory Visit (HOSPITAL_COMMUNITY)
Admission: RE | Admit: 2014-04-26 | Discharge: 2014-04-26 | Disposition: A | Discharge status: Home / Self Care | Payer: Medicare Other | Source: Ambulatory Visit | Attending: Orthopaedic Surgery | Admitting: Orthopaedic Surgery

## 2014-04-26 ENCOUNTER — Ambulatory Visit (INDEPENDENT_AMBULATORY_CARE_PROVIDER_SITE_OTHER): Payer: Medicare Other | Admitting: *Deleted

## 2014-04-26 DIAGNOSIS — I480 Paroxysmal atrial fibrillation: Secondary | ICD-10-CM | POA: Diagnosis not present

## 2014-04-26 DIAGNOSIS — R278 Other lack of coordination: Secondary | ICD-10-CM | POA: Diagnosis not present

## 2014-04-26 DIAGNOSIS — Z5181 Encounter for therapeutic drug level monitoring: Secondary | ICD-10-CM

## 2014-04-26 DIAGNOSIS — R262 Difficulty in walking, not elsewhere classified: Secondary | ICD-10-CM

## 2014-04-26 DIAGNOSIS — I4891 Unspecified atrial fibrillation: Secondary | ICD-10-CM

## 2014-04-26 DIAGNOSIS — R2689 Other abnormalities of gait and mobility: Secondary | ICD-10-CM

## 2014-04-26 DIAGNOSIS — M6281 Muscle weakness (generalized): Secondary | ICD-10-CM | POA: Diagnosis not present

## 2014-04-26 DIAGNOSIS — R29898 Other symptoms and signs involving the musculoskeletal system: Secondary | ICD-10-CM

## 2014-04-26 LAB — POCT INR: INR: 5.8

## 2014-04-26 NOTE — Therapy (Signed)
[place], [place], [address] Phone: [phone]   Fax:  [phone]  Physical Therapy Treatment  Patient Details  Name: [name] MRN: [id number] Date of Birth: 1941/04/08 Referring Provider:  Sanjuana Kava, MD  Encounter Date: 04/26/2014      PT End of Session - 04/26/14 0932    Visit Number 3   Number of Visits 12   Date for PT Re-Evaluation 06/20/14   Authorization Type medicare   Authorization - Visit Number 2   Authorization - Number of Visits 10   PT Start Time 0848   PT Stop Time 0932   PT Time Calculation (min) 44 min   Equipment Utilized During Treatment Gait belt   Activity Tolerance Patient tolerated treatment well   Behavior During Therapy [facility] for tasks assessed/performed      Past Medical History  Diagnosis Date  . Subclavian steal syndrome   . Carotid bruit     LICA 97-98% (duplex 9/21)  . Hypertension   . Hyperlipidemia   . Atrial fibrillation     x 3 yrs  . Anticoagulation management encounter   . Meningioma   . Osteoporosis   . Stroke     CVA 2008  . Pneumonia   . Breast cancer     S/P left mastectomy, 1989 remained in remission  . Anemia     Past Surgical History  Procedure Laterality Date  . Cardiac catheterization    . Mastectomy Left 1990  . Cataract extraction Bilateral 2013  . Colonoscopy  11/17/2010    Procedure: COLONOSCOPY;  Surgeon: [name], MD;  [facility];  Service: Endoscopy;  Laterality: N/A;  10:45 am  . Esophagogastroduodenoscopy  11/17/2010    Procedure: ESOPHAGOGASTRODUODENOSCOPY (EGD);  Surgeon: [name], MD;  [facility];  Service: Endoscopy;  Laterality: N/A;  . Lumbar epidural injection  06-2012--06-2013    pt. states she has had 5 epidurals in 06-2012----06-2013  . Eye surgery      There were no vitals taken for this visit.  Visit Diagnosis:  Leg weakness, bilateral  Poor balance  Difficulty  walking      Subjective Assessment - 04/26/14 0858    Symptoms Pt reports after last treatment she was extremely sore (muscle soreness), though that did dissipate over the weekend.  Pt had blood drawn this morning, with INR of 5.8 today.           [facility] PT Assessment - 04/26/14 0848    Assessment   Medical Diagnosis compression fx   Onset Date 01/07/14                  [facility] Adult PT Treatment/Exercise - 04/26/14 0848    Exercises   Exercises Lumbar   Lumbar Exercises: Stretches   Passive Hamstring Stretch 2 reps;20 seconds   Passive Hamstring Stretch Limitations Gastroc Stretch, Slantboard   Lower Trunk Rotation Other (comment)   Lower Trunk Rotation Limitations 5" x5   Quad Stretch 2 reps;30 seconds   Quad Stretch Limitations Prone with rope   Lumbar Exercises: Aerobic   Stationary [place] #2, Lv. 2 7' use of UE and LE (ave. spm 37)   Tread Mill Gait around clinic pushing W/C, with VC for upright posture.    Lumbar Exercises: Standing   Other Standing Lumbar Exercises Step Up, 4" x10 each LE with 1 HHA   Lumbar Exercises: Seated   Sit to  Stand 10 reps   Sit to Stand Limitations 2x5, with  use of (B) EU   Other Seated Lumbar Exercises Ankle DF/ PF 2x10   Other Seated Lumbar Exercises W-Back x10   Lumbar Exercises: Supine   Bridge 20 reps   Bridge Limitations 2x10   Straight Leg Raise 20 reps   Straight Leg Raises Limitations 2x10   Lumbar Exercises: Sidelying   Clam 10 reps   Hip Abduction 10 reps   Hip Abduction Limitations AAROM Lt x10, 2x10 Rt LE   Lumbar Exercises: Prone   Straight Leg Raise 20 reps   Straight Leg Raises Limitations 2x10   Other Prone Lumbar Exercises HS Curls #1 1/2, 2x10                  PT Short Term Goals - 04/21/14 0946    PT SHORT TERM GOAL #1   Title I in HEP   Time 1   Period Weeks   PT SHORT TERM GOAL #2   Title Pt to be able to stand for two minutes   Time 4   Period Weeks   PT SHORT TERM GOAL  #3   Title Pt to be able to walk behind wheelchair for five minues   Time 4   Period Weeks   PT SHORT TERM GOAL #4   Title Pain level to be no greater than a 7./10           PT Long Term Goals - 04/21/14 0947    PT LONG TERM GOAL #1   Title I in advance HEP   Time 8   Period Weeks   PT LONG TERM GOAL #2   Title 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   Time 8   Period Weeks   PT LONG TERM GOAL #3   Title Pt to be able to walk in the house with a cane    Time 8   Period Weeks   PT LONG TERM GOAL #4   Title Pain level to be no greater than a 510 80% of the time    Time 8   Period Weeks               Plan - 04/26/14 2353    Clinical Impression Statement Continued exercise program, adding standing step ups today before pt mentioned her INR level (thin blood), and rest of planned exercises in standing withheld.  Pt demonstrated great improvements from last treatment session with ability to complete double reps of exercises; pt reports great compliance with HEP.  Pt did have some difficulty with hip ABD on the Lt side, requiring AAROM to complete SLR (pt does have a hx of drop foot on this side).   Pt will benefit from skilled therapeutic intervention in order to improve on the following deficits Decreased activity tolerance;Decreased balance;Decreased coordination;Decreased mobility;Pain;Difficulty walking;Decreased strength   Rehab Potential Good   PT Frequency 3x / week   PT Duration 8 weeks   PT Treatment/Interventions ADLs/Self Care Home Management;Functional mobility training;Therapeutic activities;Therapeutic exercise;Balance training;Patient/family education   PT Next Visit Plan Add row for scapular strengthening.  Continue strengthening program, increasing activity tolerance with increased reps as able.  Progress to standing as tolerated.          Problem List Patient Active Problem List   Diagnosis Date Noted  . Elevated liver enzymes  12/16/2013  . Tremor 06/24/2013  . Encounter for therapeutic drug monitoring  05/18/2013  . Dizziness 01/12/2013  . Wound of ankle 10/23/2010  . Long term current use of anticoagulant 07/05/2010  . Occlusion and stenosis of carotid artery without mention of cerebral infarction 11/01/2009  . HYPERLIPIDEMIA 10/26/2009  . HYPERTENSION 10/26/2009  . ATRIAL FIBRILLATION 10/26/2009  . ACUTE ON CHRONIC DIASTOLIC HEART FAILURE 78/93/8101  . SYNCOPE AND COLLAPSE 10/26/2009    Lonna Cobb, DPT [phone]   04/26/2014, 9:38 AM  [place] [address] [place], [place], [address] Phone: [phone]   Fax:  [phone]

## 2014-04-29 ENCOUNTER — Ambulatory Visit (HOSPITAL_COMMUNITY): Payer: Medicare Other | Admitting: Physical Therapy

## 2014-04-30 ENCOUNTER — Ambulatory Visit (HOSPITAL_COMMUNITY): Payer: Medicare Other | Admitting: Physical Therapy

## 2014-05-03 ENCOUNTER — Ambulatory Visit (HOSPITAL_COMMUNITY)
Admission: RE | Admit: 2014-05-03 | Discharge: 2014-05-03 | Disposition: A | Discharge status: Home / Self Care | Payer: Medicare Other | Source: Ambulatory Visit | Attending: Orthopaedic Surgery | Admitting: Orthopaedic Surgery

## 2014-05-03 DIAGNOSIS — R278 Other lack of coordination: Secondary | ICD-10-CM | POA: Diagnosis not present

## 2014-05-03 DIAGNOSIS — M6281 Muscle weakness (generalized): Secondary | ICD-10-CM | POA: Diagnosis not present

## 2014-05-03 DIAGNOSIS — R2689 Other abnormalities of gait and mobility: Secondary | ICD-10-CM

## 2014-05-03 DIAGNOSIS — R29898 Other symptoms and signs involving the musculoskeletal system: Secondary | ICD-10-CM

## 2014-05-03 DIAGNOSIS — R262 Difficulty in walking, not elsewhere classified: Secondary | ICD-10-CM | POA: Diagnosis not present

## 2014-05-03 NOTE — Therapy (Signed)
[place], [place], [address] Phone: [phone]   Fax:  [phone]  Physical Therapy Treatment  Patient Details  Name: [name] MRN: [id number] Date of Birth: July 29, 1940 Referring Provider:  Sanjuana Kava, MD  Encounter Date: 05/03/2014      PT End of Session - 05/03/14 0958    Visit Number 4   Number of Visits 12   Date for PT Re-Evaluation 06/20/14   Authorization Type medicare   Authorization - Visit Number 4   Authorization - Number of Visits 10   PT Start Time 0840   PT Stop Time 0945   PT Time Calculation (min) 65 min   Equipment Utilized During Treatment Gait belt   Activity Tolerance Patient tolerated treatment well   Behavior During Therapy [facility] for tasks assessed/performed      Past Medical History  Diagnosis Date  . Subclavian steal syndrome   . Carotid bruit     LICA 78-24% (duplex 2/35)  . Hypertension   . Hyperlipidemia   . Atrial fibrillation     x 3 yrs  . Anticoagulation management encounter   . Meningioma   . Osteoporosis   . Stroke     CVA 2008  . Pneumonia   . Breast cancer     S/P left mastectomy, 1989 remained in remission  . Anemia     Past Surgical History  Procedure Laterality Date  . Cardiac catheterization    . Mastectomy Left 1990  . Cataract extraction Bilateral 2013  . Colonoscopy  11/17/2010    Procedure: COLONOSCOPY;  Surgeon: [name], MD;  [facility];  Service: Endoscopy;  Laterality: N/A;  10:45 am  . Esophagogastroduodenoscopy  11/17/2010    Procedure: ESOPHAGOGASTRODUODENOSCOPY (EGD);  Surgeon: [name], MD;  [facility];  Service: Endoscopy;  Laterality: N/A;  . Lumbar epidural injection  06-2012--06-2013    pt. states she has had 5 epidurals in 06-2012----06-2013  . Eye surgery      There were no vitals taken for this visit.  Visit Diagnosis:  Leg weakness, bilateral  Poor balance  Difficulty  walking      Subjective Assessment - 05/03/14 0959    Symptoms Pt admits to not being compliant with HEP.  Explained importance of increasing activty at home and doing HEP.  States she returns tomorrow to get her INR drawn.   Currently in Pain? Yes   Pain Score 6    Pain Location Back                    [facility] Adult PT Treatment/Exercise - 05/03/14 0848    Exercises   Exercises Lumbar   Lumbar Exercises: Stretches   Passive Hamstring Stretch 2 reps;20 seconds   Passive Hamstring Stretch Limitations Gastroc Stretch, Slantboard   Quad Stretch 2 reps;30 seconds   Quad Stretch Limitations Prone with rope   Lumbar Exercises: Aerobic   Stationary [place] #2, Lv. 2 8' use of UE and LE (ave. spm 40)   Tread Mill Gait around clinic pushing W/C, with VC for upright posture.    Lumbar Exercises: Standing   Heel Raises 10 reps   Heel Raises Limitations bilateral UE assist   Other Standing Lumbar Exercises Step Up, 4" x10 each LE with 2 HHA   Other Standing Lumbar Exercises lateral step ups 4" 10 reps each 2 HHA   Lumbar Exercises: Seated   Long  [employer] on Chair Both;10 reps   LAQ on [employer] (lbs) 2   Sit to Stand Limitations 2x5, with  use of (B) UE   Other Seated Lumbar Exercises toeraises 15 reps   Other Seated Lumbar Exercises W-Back x10   Lumbar Exercises: Supine   Bridge 20 reps   Bridge Limitations 2x10   Straight Leg Raise 20 reps   Straight Leg Raises Limitations 2x10   Lumbar Exercises: Sidelying   Clam 10 reps   Hip Abduction 10 reps   Lumbar Exercises: Prone   Straight Leg Raise 10 reps   Other Prone Lumbar Exercises HS Curls 2#, 10 reps each                  PT Short Term Goals - 04/21/14 0946    PT SHORT TERM GOAL #1   Title I in HEP   Time 1   Period Weeks   PT SHORT TERM GOAL #2   Title Pt to be able to stand for two minutes   Time 4   Period Weeks   PT SHORT TERM GOAL #3   Title Pt to be able to walk behind wheelchair  for five minues   Time 4   Period Weeks   PT SHORT TERM GOAL #4   Title Pain level to be no greater than a 7./10           PT Long Term Goals - 04/21/14 0947    PT LONG TERM GOAL #1   Title I in advance HEP   Time 8   Period Weeks   PT LONG TERM GOAL #2   Title 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   Time 8   Period Weeks   PT LONG TERM GOAL #3   Title Pt to be able to walk in the house with a cane    Time 8   Period Weeks   PT LONG TERM GOAL #4   Title Pain level to be no greater than a 510 80% of the time    Time 8   Period Weeks               Plan - 05/03/14 1000    Clinical Impression Statement Progressed to lateral step ups and standing heelraises today.  Patient unable to complete any standing exercises or sit to stand without use of bilateral UE's.  Increased to 2# weights with seated LAQ and hamstring curls today without difficulty.   Noted weakness in quadriceps with exercises.  encouraged patient to complete toeraises in seated every hour.  Pt verbalized understanding.    PT Next Visit Plan Continue strengthening program, increasing activity tolerance with increased reps as able.  Progress to standing as tolerated.          Problem List Patient Active Problem List   Diagnosis Date Noted  . HYPERLIPIDEMIA 10/26/2009    Priority: Medium  . HYPERTENSION 10/26/2009    Priority: Low  . Elevated liver enzymes 12/16/2013  . Tremor 06/24/2013  . Encounter for therapeutic drug monitoring 05/18/2013  . Dizziness 01/12/2013  . Wound of ankle 10/23/2010  . Long term current use of anticoagulant 07/05/2010  . Occlusion and stenosis of carotid artery without mention of cerebral infarction 11/01/2009  . ATRIAL FIBRILLATION 10/26/2009  . ACUTE ON CHRONIC DIASTOLIC HEART FAILURE 40/98/1191  . SYNCOPE AND COLLAPSE 10/26/2009    Teena Irani, PTA/CLT [phone] 05/03/2014, 10:09 AM  [place], [place], [address] Phone: [phone]   Fax:  [phone]

## 2014-05-05 ENCOUNTER — Ambulatory Visit (HOSPITAL_COMMUNITY)
Admission: RE | Admit: 2014-05-05 | Discharge: 2014-05-05 | Disposition: A | Discharge status: Home / Self Care | Payer: Medicare Other | Source: Ambulatory Visit | Attending: Orthopaedic Surgery | Admitting: Orthopaedic Surgery

## 2014-05-05 ENCOUNTER — Ambulatory Visit (INDEPENDENT_AMBULATORY_CARE_PROVIDER_SITE_OTHER): Payer: Medicare Other | Admitting: *Deleted

## 2014-05-05 DIAGNOSIS — M6281 Muscle weakness (generalized): Secondary | ICD-10-CM | POA: Diagnosis not present

## 2014-05-05 DIAGNOSIS — R262 Difficulty in walking, not elsewhere classified: Secondary | ICD-10-CM | POA: Diagnosis not present

## 2014-05-05 DIAGNOSIS — Z5181 Encounter for therapeutic drug level monitoring: Secondary | ICD-10-CM | POA: Diagnosis not present

## 2014-05-05 DIAGNOSIS — I4891 Unspecified atrial fibrillation: Secondary | ICD-10-CM | POA: Diagnosis not present

## 2014-05-05 DIAGNOSIS — R29898 Other symptoms and signs involving the musculoskeletal system: Secondary | ICD-10-CM

## 2014-05-05 DIAGNOSIS — R2689 Other abnormalities of gait and mobility: Secondary | ICD-10-CM

## 2014-05-05 DIAGNOSIS — R278 Other lack of coordination: Secondary | ICD-10-CM | POA: Diagnosis not present

## 2014-05-05 LAB — POCT INR: INR: 1.3

## 2014-05-05 NOTE — Patient Instructions (Addendum)
Knee-to-Chest Stretch: Unilateral   With hand behind right knee, pull knee in to chest until a comfortable stretch is felt in lower back and buttocks. Keep back relaxed. Hold _30___ seconds. Repeat __3__ times per set. Do _1___ sets per session. Do ___2_ sessions per day.  http://orth.exer.us/126   Copyright  VHI. All rights reserved.  Hamstring Stretch: Active   Support behind right knee. Starting with knee bent, attempt to straighten knee until a comfortable stretch is felt in back of thigh. Hold __30__ seconds. Repeat __3__ times per set. Do ___1_ sets per session. Do ___2_ sessions per day.  http://orth.exer.us/158   Copyright  VHI. All rights reserved.  Hip Abduction / Adduction: with Knee Flexion (Supine)   With right knee bent, gently lower knee to side and return. Repeat __10__ times per set. Do _1___ sets per session. Do _2___ sessions per day.  http://orth.exer.us/682   Copyright  VHI. All rights reserved.  Bent Leg Lift (Hook-Lying)   Tighten stomach and slowly raise right leg __3__ inches from floor. Keep trunk rigid. Hold _3___ seconds. Repeat __10__ times per set. Do __1__ sets per session. Do _2___ sessions per day.  http://orth.exer.us/1090   Copyright  VHI. All rights reserved.

## 2014-05-05 NOTE — Therapy (Signed)
[place], [place], [address] Phone: [phone]   Fax:  [phone]  Physical Therapy Treatment  Patient Details  Name: [name] MRN: [id number] Date of Birth: 12-Aug-1940 Referring Provider:  Sanjuana Kava, MD  Encounter Date: 05/05/2014      PT End of Session - 05/05/14 1021    PT Start Time 0935   PT Stop Time 1030   PT Time Calculation (min) 55 min      Past Medical History  Diagnosis Date  . Subclavian steal syndrome   . Carotid bruit     LICA 84-13% (duplex 2/44)  . Hypertension   . Hyperlipidemia   . Atrial fibrillation     x 3 yrs  . Anticoagulation management encounter   . Meningioma   . Osteoporosis   . Stroke     CVA 2008  . Pneumonia   . Breast cancer     S/P left mastectomy, 1989 remained in remission  . Anemia     Past Surgical History  Procedure Laterality Date  . Cardiac catheterization    . Mastectomy Left 1990  . Cataract extraction Bilateral 2013  . Colonoscopy  11/17/2010    Procedure: COLONOSCOPY;  Surgeon: [name], MD;  [facility];  Service: Endoscopy;  Laterality: N/A;  10:45 am  . Esophagogastroduodenoscopy  11/17/2010    Procedure: ESOPHAGOGASTRODUODENOSCOPY (EGD);  Surgeon: [name], MD;  [facility];  Service: Endoscopy;  Laterality: N/A;  . Lumbar epidural injection  06-2012--06-2013    pt. states she has had 5 epidurals in 06-2012----06-2013  . Eye surgery      There were no vitals taken for this visit.  Visit Diagnosis:  Leg weakness, bilateral  Poor balance  Difficulty walking      Subjective Assessment - 05/05/14 0936    Symptoms Pt states that her Lt foot does not want to lift up .  Pt is doing exercises    Currently in Pain? Yes   Pain Score 6    Pain Location Back   Pain Orientation Right;Left;Lower                    [facility] Adult PT Treatment/Exercise - 05/05/14 0940    Exercises   Exercises Lumbar   Lumbar Exercises: Stretches   Active Hamstring Stretch 3 reps;30 seconds   Single Knee to Chest Stretch 3 reps;30 seconds   Lumbar Exercises: Aerobic   Stationary Bike [place] hills 2; L3 x 8:00   Lumbar Exercises: Supine   Ab Set 10 reps   Clam 10 reps   Bent Knee Raise 10 reps   Bridge 15 reps   Bridge Limitations with 5 second hold    Straight Leg Raise 10 reps   Other Supine Lumbar Exercises hip isometric x 10    Lumbar Exercises: Prone   Straight Leg Raise 15 reps                  PT Short Term Goals - 04/21/14 0946    PT SHORT TERM GOAL #1   Title I in HEP   Time 1   Period Weeks   PT SHORT TERM GOAL #2   Title Pt to be able to stand for two minutes   Time 4   Period Weeks   PT SHORT TERM GOAL #3   Title Pt to be able to walk behind wheelchair for five minues  Time 4   Period Weeks   PT SHORT TERM GOAL #4   Title Pain level to be no greater than a 7./10           PT Long Term Goals - 04/21/14 0947    PT LONG TERM GOAL #1   Title I in advance HEP   Time 8   Period Weeks   PT LONG TERM GOAL #2   Title 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   Time 8   Period Weeks   PT LONG TERM GOAL #3   Title Pt to be able to walk in the house with a cane    Time 8   Period Weeks   PT LONG TERM GOAL #4   Title Pain level to be no greater than a 510 80% of the time    Time 8   Period Weeks               Plan - 05/05/14 4142    Clinical Impression Statement Pt progressed in suping positin to ensure lumbar stability with exercises.  Pt needed multimodal cuing to perform exercises correctly.  Pt given new HEP to include new exercises given today.    PT Next Visit Plan begin decompression exercises with T-band next treatment.         Problem List Patient Active Problem List   Diagnosis Date Noted  . Elevated liver enzymes 12/16/2013  . Tremor 06/24/2013  . Encounter for therapeutic drug monitoring  05/18/2013  . Dizziness 01/12/2013  . Wound of ankle 10/23/2010  . Long term current use of anticoagulant 07/05/2010  . Occlusion and stenosis of carotid artery without mention of cerebral infarction 11/01/2009  . HYPERLIPIDEMIA 10/26/2009  . HYPERTENSION 10/26/2009  . ATRIAL FIBRILLATION 10/26/2009  . ACUTE ON CHRONIC DIASTOLIC HEART FAILURE 39/53/2023  . SYNCOPE AND COLLAPSE 10/26/2009    RUSSELL,[name] PT 05/05/2014, 10:22 AM  [place], [place], [address] Phone: [phone]   Fax:  [phone]

## 2014-05-07 ENCOUNTER — Ambulatory Visit (HOSPITAL_COMMUNITY)
Admission: RE | Admit: 2014-05-07 | Discharge: 2014-05-07 | Disposition: A | Discharge status: Home / Self Care | Payer: Medicare Other | Source: Ambulatory Visit | Attending: Orthopaedic Surgery | Admitting: Orthopaedic Surgery

## 2014-05-07 DIAGNOSIS — R262 Difficulty in walking, not elsewhere classified: Secondary | ICD-10-CM

## 2014-05-07 DIAGNOSIS — R2689 Other abnormalities of gait and mobility: Secondary | ICD-10-CM

## 2014-05-07 DIAGNOSIS — R278 Other lack of coordination: Secondary | ICD-10-CM | POA: Diagnosis not present

## 2014-05-07 DIAGNOSIS — R29898 Other symptoms and signs involving the musculoskeletal system: Secondary | ICD-10-CM

## 2014-05-07 DIAGNOSIS — M6281 Muscle weakness (generalized): Secondary | ICD-10-CM | POA: Diagnosis not present

## 2014-05-07 NOTE — Therapy (Signed)
[place], [place], [address] Phone: [phone]   Fax:  [phone]  Physical Therapy Treatment  Patient Details  Name: [name] MRN: [id number] Date of Birth: 1941/03/12 Referring Provider:  Sanjuana Kava, MD  Encounter Date: 05/07/2014      PT End of Session - 05/07/14 0832    Visit Number 6   Number of Visits 12   Date for PT Re-Evaluation 06/20/14   Authorization Type medicare   Authorization - Visit Number 6   Authorization - Number of Visits 10   PT Start Time 0801   PT Stop Time 0842   PT Time Calculation (min) 41 min   Activity Tolerance Patient limited by pain   Behavior During Therapy [facility] for tasks assessed/performed      Past Medical History  Diagnosis Date  . Subclavian steal syndrome   . Carotid bruit     LICA 62-37% (duplex 6/28)  . Hypertension   . Hyperlipidemia   . Atrial fibrillation     x 3 yrs  . Anticoagulation management encounter   . Meningioma   . Osteoporosis   . Stroke     CVA 2008  . Pneumonia   . Breast cancer     S/P left mastectomy, 1989 remained in remission  . Anemia     Past Surgical History  Procedure Laterality Date  . Cardiac catheterization    . Mastectomy Left 1990  . Cataract extraction Bilateral 2013  . Colonoscopy  11/17/2010    Procedure: COLONOSCOPY;  Surgeon: [name], MD;  [facility];  Service: Endoscopy;  Laterality: N/A;  10:45 am  . Esophagogastroduodenoscopy  11/17/2010    Procedure: ESOPHAGOGASTRODUODENOSCOPY (EGD);  Surgeon: [name], MD;  [facility];  Service: Endoscopy;  Laterality: N/A;  . Lumbar epidural injection  06-2012--06-2013    pt. states she has had 5 epidurals in 06-2012----06-2013  . Eye surgery      There were no vitals taken for this visit.  Visit Diagnosis:  Leg weakness, bilateral  Poor balance  Difficulty walking      Subjective Assessment - 05/07/14 0806    Symptoms Pt reports significant amounts of pain in the Rt side low back.  Pt reprots she husband massaged it, and she applied some heat to the area.    Currently in Pain? Yes   Pain Score 10-Worst pain ever   Pain Location Back   Pain Orientation Right;Lower          [facility] PT Assessment - 05/07/14 0001    Assessment   Medical Diagnosis compression fx   Onset Date 01/07/14                  [facility] Adult PT Treatment/Exercise - 05/07/14 0001    Lumbar Exercises: Stretches   Active Hamstring Stretch 2 reps;30 seconds   Active Hamstring Stretch Limitations 12" Step   Passive Hamstring Stretch 2 reps;30 seconds   Passive Hamstring Stretch Limitations Gastroc Stretch, Slantboard   Single Knee to Chest Stretch 3 reps;20 seconds   Lower Trunk Rotation 10 seconds;5 reps   Lower Trunk Rotation Limitations --   Lumbar Exercises: Aerobic   Stationary Bike Nustep hills 2; L4 x10' with UE and LE   Lumbar Exercises: Supine   Bent Knee Raise 15 reps   Straight Leg Raise 10 reps   Lumbar Exercises: Sidelying   Clam 15 reps   Modalities  Modalities Moist Heat   Moist Heat Therapy   Number Minutes Moist Heat 10 Minutes  with supine therapeutic exercises   Moist Heat Location Other (comment)  lumbar spine                  PT Short Term Goals - 05/07/14 0831    PT SHORT TERM GOAL #1   Title I in HEP   Status On-going   PT SHORT TERM GOAL #2   Title Pt to be able to stand for two minutes   Status On-going   PT SHORT TERM GOAL #3   Title Pt to be able to walk behind wheelchair for five minues   Status On-going   PT SHORT TERM GOAL #4   Title Pain level to be no greater than a 7/10   Status On-going           PT Long Term Goals - 05/07/14 8841    PT LONG TERM GOAL #1   Title I in advance HEP   Status On-going   PT LONG TERM GOAL #2   Title 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  Pt is very concerned about digressing with PT as she feels she is making progress.  Limited standing exercises today secondary to pain in standing.  Pt was able to perform exercises on mat table without increases in pain, though was unable to lift leg to knee height with SLR flexion and unable to perform SLR abduction secondary to low back pain.  MHP was applied with supine exericses to decrease muscle tone today.  Finsihed treatment session on NuStep.  Pt educated to rest on HEP the rest of day, and re-attempt stretching and mobility exercises tomorrow; pt instructed to avoid staying in bed all day today though to avoid letting muscles get stiff and tight after treatment session.  Also, educated pt in use of ice/heat at home for pain management.    Pt will benefit from skilled therapeutic intervention in order to improve on the following deficits Decreased activity tolerance;Decreased balance;Decreased coordination;Decreased mobility;Pain;Difficulty walking;Decreased strength   Rehab Potential Good   PT Frequency 3x / week   PT Duration 8 weeks   PT Treatment/Interventions ADLs/Self Care Home Management;Functional mobility training;Therapeutic activities;Therapeutic exercise;Balance training;Patient/family education   PT Next Visit Plan If pain is tolerable, resume standing exercises.  Attempt gait assessment for time in the clinic for assessment of STG #3 for gait.         Problem List Patient Active Problem List   Diagnosis Date Noted  . Elevated liver enzymes 12/16/2013  . Tremor 06/24/2013  . Encounter for therapeutic drug  monitoring 05/18/2013  . Dizziness 01/12/2013  . Wound of ankle 10/23/2010  . Long term current use of anticoagulant 07/05/2010  . Occlusion and stenosis of carotid artery without mention of cerebral infarction  11/01/2009  . HYPERLIPIDEMIA 10/26/2009  . HYPERTENSION 10/26/2009  . ATRIAL FIBRILLATION 10/26/2009  . ACUTE ON CHRONIC DIASTOLIC HEART FAILURE 02/77/4128  . SYNCOPE AND COLLAPSE 10/26/2009    Lonna Cobb, DPT [phone]  05/07/2014, 8:36 AM  [place], [place], [address] Phone: [phone]   Fax:  [phone]

## 2014-05-10 ENCOUNTER — Ambulatory Visit (HOSPITAL_COMMUNITY)
Admission: RE | Admit: 2014-05-10 | Discharge: 2014-05-10 | Disposition: A | Discharge status: Home / Self Care | Payer: Medicare Other | Source: Ambulatory Visit | Attending: Orthopaedic Surgery | Admitting: Orthopaedic Surgery

## 2014-05-10 DIAGNOSIS — M6281 Muscle weakness (generalized): Secondary | ICD-10-CM | POA: Insufficient documentation

## 2014-05-10 DIAGNOSIS — R262 Difficulty in walking, not elsewhere classified: Secondary | ICD-10-CM | POA: Diagnosis not present

## 2014-05-10 DIAGNOSIS — R29898 Other symptoms and signs involving the musculoskeletal system: Secondary | ICD-10-CM

## 2014-05-10 DIAGNOSIS — R278 Other lack of coordination: Secondary | ICD-10-CM | POA: Insufficient documentation

## 2014-05-10 DIAGNOSIS — D649 Anemia, unspecified: Secondary | ICD-10-CM

## 2014-05-10 DIAGNOSIS — R2689 Other abnormalities of gait and mobility: Secondary | ICD-10-CM

## 2014-05-10 HISTORY — DX: Anemia, unspecified: D64.9

## 2014-05-10 NOTE — Therapy (Signed)
[place], [place], [address] Phone: [phone]   Fax:  [phone]  Physical Therapy Treatment  Patient Details  Name: [name] MRN: [id number] Date of Birth: Aug 13, 1940 Referring Provider:  Sanjuana Kava, MD  Encounter Date: 05/10/2014      PT End of Session - 05/10/14 0911    Visit Number 7   Number of Visits 12   Date for PT Re-Evaluation 06/20/14   Authorization Type medicare   Authorization - Visit Number 7   Authorization - Number of Visits 10   PT Start Time 0808   PT Stop Time 0902   PT Time Calculation (min) 54 min   Activity Tolerance Patient limited by pain   Behavior During Therapy [facility] for tasks assessed/performed      Past Medical History  Diagnosis Date  . Subclavian steal syndrome   . Carotid bruit     LICA 12-87% (duplex 8/67)  . Hypertension   . Hyperlipidemia   . Atrial fibrillation     x 3 yrs  . Anticoagulation management encounter   . Meningioma   . Osteoporosis   . Stroke     CVA 2008  . Pneumonia   . Breast cancer     S/P left mastectomy, 1989 remained in remission  . Anemia     Past Surgical History  Procedure Laterality Date  . Cardiac catheterization    . Mastectomy Left 1990  . Cataract extraction Bilateral 2013  . Colonoscopy  11/17/2010    Procedure: COLONOSCOPY;  Surgeon: [name], MD;  [facility];  Service: Endoscopy;  Laterality: N/A;  10:45 am  . Esophagogastroduodenoscopy  11/17/2010    Procedure: ESOPHAGOGASTRODUODENOSCOPY (EGD);  Surgeon: [name], MD;  [facility];  Service: Endoscopy;  Laterality: N/A;  . Lumbar epidural injection  06-2012--06-2013    pt. states she has had 5 epidurals in 06-2012----06-2013  . Eye surgery      There were no vitals taken for this visit.  Visit Diagnosis:  Leg weakness, bilateral  Poor balance  Difficulty walking      Subjective Assessment - 05/10/14 0815    Symptoms  Pt and spouse report concerns this morning.  Spouse states patient is less mobile now than when she started and patient reports more pain in Lower back and legs that's progressively gotten worse since starting therapy. Currently with 10/10 pain.   Currently in Pain? Yes   Pain Score 10-Worst pain ever   Pain Location Back   Pain Orientation Lower                    [facility] Adult PT Treatment/Exercise - 05/10/14 0820    Exercises   Exercises Lumbar   Lumbar Exercises: Aerobic   Stationary Bike Nustep hills 2; L2 x8' with UE and LE   Tread Mill Gait 1/2 around clinic pushing W/C, with VC for upright posture.    Lumbar Exercises: Seated   Sit to Stand 5 reps   Sit to Stand Limitations with  use of (B) UE   Other Seated Lumbar Exercises toeraises 15 reps   Other Seated Lumbar Exercises W-Back x10   Lumbar Exercises: Supine   Ab Set 10 reps   Bridge 15 reps   Bridge Limitations with 5 second hold    Straight Leg Raise 10 reps   Other Supine Lumbar Exercises decompression exercises 1-5 5 reps each  PT Short Term Goals - 05/07/14 0831    PT SHORT TERM GOAL #1   Title I in HEP   Status On-going   PT SHORT TERM GOAL #2   Title Pt to be able to stand for two minutes   Status On-going   PT SHORT TERM GOAL #3   Title Pt to be able to walk behind wheelchair for five minues   Status On-going   PT SHORT TERM GOAL #4   Title Pain level to be no greater than a 7/10   Status On-going           PT Long Term Goals - 05/07/14 4975    PT LONG TERM GOAL #1   Title I in advance HEP   Status On-going   PT LONG TERM GOAL #2   Title 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  Encouraged patient to continue HEP and increase mobility/ambulation at home.  Will continue with mat/seated exercises and progress to standing exericses.   PT Next Visit Plan Proceed with slower progression. Assess pain and mobility following todays session at next visit.        Problem List Patient Active Problem List   Diagnosis Date Noted  . HYPERLIPIDEMIA 10/26/2009    Priority: Medium  . HYPERTENSION 10/26/2009    Priority: Low  . Elevated liver enzymes 12/16/2013  . Tremor 06/24/2013  . Encounter for therapeutic drug monitoring 05/18/2013  . Dizziness 01/12/2013  . Wound of ankle 10/23/2010  . Long term current use of anticoagulant 07/05/2010  . Occlusion and stenosis of carotid artery without mention of cerebral infarction 11/01/2009  . ATRIAL FIBRILLATION 10/26/2009  . ACUTE ON CHRONIC DIASTOLIC HEART FAILURE 30/08/1100  . SYNCOPE AND COLLAPSE 10/26/2009    Teena Irani, PTA/CLT [phone] 05/10/2014, 9:16 AM  [place], [place], [address] Phone: [phone]   Fax:  [phone]

## 2014-05-12 ENCOUNTER — Ambulatory Visit (HOSPITAL_COMMUNITY)
Admission: RE | Admit: 2014-05-12 | Discharge: 2014-05-12 | Disposition: A | Discharge status: Home / Self Care | Payer: Medicare Other | Source: Ambulatory Visit | Attending: Orthopaedic Surgery | Admitting: Orthopaedic Surgery

## 2014-05-12 DIAGNOSIS — M6281 Muscle weakness (generalized): Secondary | ICD-10-CM | POA: Diagnosis not present

## 2014-05-12 DIAGNOSIS — R262 Difficulty in walking, not elsewhere classified: Secondary | ICD-10-CM

## 2014-05-12 DIAGNOSIS — R2689 Other abnormalities of gait and mobility: Secondary | ICD-10-CM

## 2014-05-12 DIAGNOSIS — R29898 Other symptoms and signs involving the musculoskeletal system: Secondary | ICD-10-CM

## 2014-05-12 DIAGNOSIS — R278 Other lack of coordination: Secondary | ICD-10-CM | POA: Diagnosis not present

## 2014-05-12 NOTE — Therapy (Signed)
[place], [place], [address] Phone: [phone]   Fax:  [phone]  Physical Therapy Treatment  Patient Details  Name: [name] MRN: [id number] Date of Birth: Dec 13, 1940 Referring Provider:  Sanjuana Kava, MD  Encounter Date: 05/12/2014      PT End of Session - 05/12/14 1057    Visit Number 8   Number of Visits 12   Authorization Type medicare   Authorization - Visit Number 8   Authorization - Number of Visits 10   PT Start Time 0805   PT Stop Time 3329   PT Time Calculation (min) 50 min   Equipment Utilized During Treatment Gait belt      Past Medical History  Diagnosis Date  . Subclavian steal syndrome   . Carotid bruit     LICA 51-88% (duplex 4/16)  . Hypertension   . Hyperlipidemia   . Atrial fibrillation     x 3 yrs  . Anticoagulation management encounter   . Meningioma   . Osteoporosis   . Stroke     CVA 2008  . Pneumonia   . Breast cancer     S/P left mastectomy, 1989 remained in remission  . Anemia     Past Surgical History  Procedure Laterality Date  . Cardiac catheterization    . Mastectomy Left 1990  . Cataract extraction Bilateral 2013  . Colonoscopy  11/17/2010    Procedure: COLONOSCOPY;  Surgeon: [name], MD;  [facility];  Service: Endoscopy;  Laterality: N/A;  10:45 am  . Esophagogastroduodenoscopy  11/17/2010    Procedure: ESOPHAGOGASTRODUODENOSCOPY (EGD);  Surgeon: [name], MD;  [facility];  Service: Endoscopy;  Laterality: N/A;  . Lumbar epidural injection  06-2012--06-2013    pt. states she has had 5 epidurals in 06-2012----06-2013  . Eye surgery      There were no vitals taken for this visit.  Visit Diagnosis:  Leg weakness, bilateral  Poor balance  Difficulty walking      Subjective Assessment - 05/12/14 0946    Symptoms Pt states her pain is better today but still high at 10/10.  Pt reports she is not sore today  and last session was much more manageable.     Currently in Pain? Yes   Pain Score 10-Worst pain ever              [facility] Adult PT Treatment/Exercise - 05/12/14 0943    Lumbar Exercises: Stretches   Passive Hamstring Stretch Limitations Gastroc Stretch, Slantboard   Single Knee to Chest Stretch 3 reps;20 seconds   Lumbar Exercises: Aerobic   Stationary Bike Nustep hills 2; L2 x8' with UE and LE   Tread Mill Gait 45 feet X 1, 15' X 2 pushing W/C, with VC for upright posture.    Lumbar Exercises: Seated   Sit to Stand 5 reps   Sit to Stand Limitations with  use of (B) UE   Other Seated Lumbar Exercises toeraises 15 reps   Other Seated Lumbar Exercises W-Back x10, thoracic extension 10X UE's crossed   Lumbar Exercises: Supine   Bridge 15 reps   Other Supine Lumbar Exercises decompression exercises 1-5 5 reps each   Lumbar Exercises: Sidelying   Clam 15 reps              PT Short Term Goals - 05/07/14 0831    PT SHORT TERM GOAL #1   Title I in  HEP   Status On-going   PT SHORT TERM GOAL #2   Title Pt to be able to stand for two minutes   Status On-going   PT SHORT TERM GOAL #3   Title Pt to be able to walk behind wheelchair for five minues   Status On-going   PT SHORT TERM GOAL #4   Title Pain level to be no greater than a 7/10   Status On-going           PT Long Term Goals - 05/07/14 3559    PT LONG TERM GOAL #1   Title I in advance HEP   Status On-going   PT LONG TERM GOAL #2   Title 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  discussed importance of  continuing to try with patient and pushing herself.  Pt requires alot of encouragement to complete walking and "harder" actvities.  Pt with poor standing tolerance.  Added seated thoracic excursion today without c/o pain.  Pt responding better to slower progression and mostly mat actvities.    PT Next Visit Plan Proceed with slower progression. Assess pain and mobility following todays session at next visit.        Problem List Patient Active Problem List   Diagnosis Date Noted  . HYPERLIPIDEMIA 10/26/2009    Priority: Medium  . HYPERTENSION 10/26/2009    Priority: Low  . Elevated liver enzymes 12/16/2013  . Tremor 06/24/2013  . Encounter for therapeutic drug monitoring 05/18/2013  . Dizziness 01/12/2013  . Wound of ankle 10/23/2010  . Long term current use of anticoagulant 07/05/2010  . Occlusion and stenosis of carotid artery without mention of cerebral infarction 11/01/2009  . ATRIAL FIBRILLATION 10/26/2009  . ACUTE ON CHRONIC DIASTOLIC HEART FAILURE 74/16/3845  . SYNCOPE AND COLLAPSE 10/26/2009    Teena Irani, PTA/CLT [phone] 05/12/2014, 11:04 AM  [place], [place], [address] Phone: [phone]   Fax:  [phone]

## 2014-05-14 ENCOUNTER — Ambulatory Visit (HOSPITAL_COMMUNITY)
Admission: RE | Admit: 2014-05-14 | Discharge: 2014-05-14 | Disposition: A | Discharge status: Home / Self Care | Payer: Medicare Other | Source: Ambulatory Visit | Attending: Orthopaedic Surgery | Admitting: Orthopaedic Surgery

## 2014-05-14 DIAGNOSIS — R2689 Other abnormalities of gait and mobility: Secondary | ICD-10-CM

## 2014-05-14 DIAGNOSIS — R278 Other lack of coordination: Secondary | ICD-10-CM | POA: Diagnosis not present

## 2014-05-14 DIAGNOSIS — R29898 Other symptoms and signs involving the musculoskeletal system: Secondary | ICD-10-CM

## 2014-05-14 DIAGNOSIS — M6281 Muscle weakness (generalized): Secondary | ICD-10-CM | POA: Diagnosis not present

## 2014-05-14 DIAGNOSIS — R262 Difficulty in walking, not elsewhere classified: Secondary | ICD-10-CM | POA: Diagnosis not present

## 2014-05-14 NOTE — Therapy (Signed)
[place], [place], [address] Phone: [phone]   Fax:  [phone]  Physical Therapy Treatment  Patient Details  Name: [name] MRN: [id number] Date of Birth: [birth year] Referring Provider:  Sanjuana Kava, MD  Encounter Date: 05/14/2014      PT End of Session - 05/14/14 0928    Visit Number 9   Number of Visits 12   Date for PT Re-Evaluation 06/20/14   Authorization Type medicare   Authorization - Visit Number 9   Authorization - Number of Visits 10   PT Start Time 0849   PT Stop Time 0932   PT Time Calculation (min) 43 min   Activity Tolerance Patient tolerated treatment well   Behavior During Therapy [facility] for tasks assessed/performed      Past Medical History  Diagnosis Date  . Subclavian steal syndrome   . Carotid bruit     LICA 01-09% (duplex 3/23)  . Hypertension   . Hyperlipidemia   . Atrial fibrillation     x 3 yrs  . Anticoagulation management encounter   . Meningioma   . Osteoporosis   . Stroke     CVA 2008  . Pneumonia   . Breast cancer     S/P left mastectomy, 1989 remained in remission  . Anemia     Past Surgical History  Procedure Laterality Date  . Cardiac catheterization    . Mastectomy Left 1990  . Cataract extraction Bilateral 2013  . Colonoscopy  11/17/2010    Procedure: COLONOSCOPY;  Surgeon: [name], MD;  [facility];  Service: Endoscopy;  Laterality: N/A;  10:45 am  . Esophagogastroduodenoscopy  11/17/2010    Procedure: ESOPHAGOGASTRODUODENOSCOPY (EGD);  Surgeon: [name], MD;  [facility];  Service: Endoscopy;  Laterality: N/A;  . Lumbar epidural injection  06-2012--06-2013    pt. states she has had 5 epidurals in 06-2012----06-2013  . Eye surgery      There were no vitals taken for this visit.  Visit Diagnosis:  Leg weakness, bilateral  Poor balance  Difficulty walking      Subjective Assessment - 05/14/14 0905    Symptoms Pt reports her pain is slightly less, and feels like the last two treatment session have been beneficial as it has been more manageable.  Recieved new orders from [facility], including ther ex, HEP, and any modalities 2-3 times a week.           [facility] PT Assessment - 05/14/14 0001    Assessment   Medical Diagnosis compression fx   Onset Date 01/07/14                  [facility] Adult PT Treatment/Exercise - 05/14/14 0001    Exercises   Exercises Lumbar   Lumbar Exercises: Stretches   Passive Hamstring Stretch 3 reps;30 seconds   Passive Hamstring Stretch Limitations Gastroc Stretch, Slantboard   Single Knee to Chest Stretch 2 reps;20 seconds   Lower Trunk Rotation Other (comment)   Lower Trunk Rotation Limitations x10   Lumbar Exercises: Aerobic   Stationary Bike Nustep hills 2; L2 x8' with UE and LE   Tread Mill Gait 45 feet X 1 pushing W/C, with VC for upright posture.    Lumbar Exercises: Standing   Other Standing Lumbar Exercises Side Step 8 feet RT x2, with HHA   Lumbar Exercises: Seated   Sit to Stand 10 reps   Other Seated  Lumbar Exercises toeraises 2x10 reps   Lumbar Exercises: Supine   Clam 10 reps   Clam Limitations RTB   Bridge 10 reps   Straight Leg Raise 10 reps   Straight Leg Raises Limitations 2x5                  PT Short Term Goals - 05/07/14 0831    PT SHORT TERM GOAL #1   Title I in HEP   Status On-going   PT SHORT TERM GOAL #2   Title Pt to be able to stand for two minutes   Status On-going   PT SHORT TERM GOAL #3   Title Pt to be able to walk behind wheelchair for five minues   Status On-going   PT SHORT TERM GOAL #4   Title Pain level to be no greater than a 7/10   Status On-going           PT Long Term Goals - 05/07/14 8413    PT LONG TERM GOAL #1   Title I in advance HEP   Status On-going   PT LONG TERM GOAL #2   Title 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  Pt agreeable to alternating functional and mat exercises in the clinic today for strengtheing.  Added short distance side stepping today, with (B) HHA though pt was able to maintain upright posture without VC for this today.     Pt will benefit from skilled therapeutic intervention in order to improve on the following deficits Decreased activity tolerance;Decreased balance;Decreased coordination;Decreased mobility;Pain;Difficulty walking;Decreased strength   Rehab Potential Good   PT Frequency 3x / week   PT Duration 8 weeks   PT Treatment/Interventions ADLs/Self Care Home Management;Functional mobility training;Therapeutic activities;Therapeutic exercise;Balance training;Patient/family education   PT Next Visit Plan Proceed with slower progression. Update Gcode next visit.         Problem List Patient Active Problem List   Diagnosis Date Noted  . Elevated liver enzymes 12/16/2013  . Tremor 06/24/2013  . Encounter for therapeutic drug monitoring 05/18/2013  . Dizziness 01/12/2013  . Wound of ankle 10/23/2010  . Long term current use of anticoagulant 07/05/2010  . Occlusion and stenosis of carotid artery without mention of cerebral infarction 11/01/2009  . HYPERLIPIDEMIA 10/26/2009  . HYPERTENSION 10/26/2009  . ATRIAL FIBRILLATION 10/26/2009  . ACUTE ON CHRONIC DIASTOLIC HEART FAILURE 24/40/1027  . SYNCOPE AND COLLAPSE 10/26/2009    Lonna Cobb, DPT [phone]  05/14/2014, 9:33 AM  [place] [address] [place], [place], [address] Phone: [phone]   Fax:  [phone]

## 2014-05-17 ENCOUNTER — Ambulatory Visit (INDEPENDENT_AMBULATORY_CARE_PROVIDER_SITE_OTHER): Payer: Medicare Other | Admitting: *Deleted

## 2014-05-17 ENCOUNTER — Ambulatory Visit (HOSPITAL_COMMUNITY)
Admission: RE | Admit: 2014-05-17 | Discharge: 2014-05-17 | Disposition: A | Discharge status: Home / Self Care | Payer: Medicare Other | Source: Ambulatory Visit | Attending: Orthopaedic Surgery | Admitting: Orthopaedic Surgery

## 2014-05-17 DIAGNOSIS — I4891 Unspecified atrial fibrillation: Secondary | ICD-10-CM

## 2014-05-17 DIAGNOSIS — M6281 Muscle weakness (generalized): Secondary | ICD-10-CM | POA: Diagnosis not present

## 2014-05-17 DIAGNOSIS — R2689 Other abnormalities of gait and mobility: Secondary | ICD-10-CM

## 2014-05-17 DIAGNOSIS — R29898 Other symptoms and signs involving the musculoskeletal system: Secondary | ICD-10-CM

## 2014-05-17 DIAGNOSIS — Z5181 Encounter for therapeutic drug level monitoring: Secondary | ICD-10-CM

## 2014-05-17 DIAGNOSIS — R278 Other lack of coordination: Secondary | ICD-10-CM | POA: Diagnosis not present

## 2014-05-17 DIAGNOSIS — I480 Paroxysmal atrial fibrillation: Secondary | ICD-10-CM

## 2014-05-17 DIAGNOSIS — R262 Difficulty in walking, not elsewhere classified: Secondary | ICD-10-CM | POA: Diagnosis not present

## 2014-05-17 LAB — POCT INR: INR: 1.7

## 2014-05-17 NOTE — Therapy (Signed)
[place], [place], [address] Phone: [phone]   Fax:  [phone]  Physical Therapy Treatment  Patient Details  Name: [name] MRN: [id number] Date of Birth: Feb 06, 1941 Referring Provider:  Sanjuana Kava, MD  Encounter Date: 05/17/2014      PT End of Session - 05/17/14 0921    Visit Number 10   Number of Visits 11   Date for PT Re-Evaluation 06/20/14   Authorization Type medicare   Authorization - Visit Number 10   Authorization - Number of Visits 10   PT Start Time 0539   PT Stop Time 0921   PT Time Calculation (min) 44 min   Activity Tolerance Patient limited by fatigue;Patient tolerated treatment well   Behavior During Therapy [facility] for tasks assessed/performed      Past Medical History  Diagnosis Date  . Subclavian steal syndrome   . Carotid bruit     LICA 76-73% (duplex 4/19)  . Hypertension   . Hyperlipidemia   . Atrial fibrillation     x 3 yrs  . Anticoagulation management encounter   . Meningioma   . Osteoporosis   . Stroke     CVA 2008  . Pneumonia   . Breast cancer     S/P left mastectomy, 1989 remained in remission  . Anemia     Past Surgical History  Procedure Laterality Date  . Cardiac catheterization    . Mastectomy Left 1990  . Cataract extraction Bilateral 2013  . Colonoscopy  11/17/2010    Procedure: COLONOSCOPY;  Surgeon: [name], MD;  [facility];  Service: Endoscopy;  Laterality: N/A;  10:45 am  . Esophagogastroduodenoscopy  11/17/2010    Procedure: ESOPHAGOGASTRODUODENOSCOPY (EGD);  Surgeon: [name], MD;  [facility];  Service: Endoscopy;  Laterality: N/A;  . Lumbar epidural injection  06-2012--06-2013    pt. states she has had 5 epidurals in 06-2012----06-2013  . Eye surgery      There were no vitals taken for this visit.  Visit Diagnosis:  Leg weakness, bilateral  Poor balance  Difficulty walking      Subjective  Assessment - 05/17/14 0838    Symptoms Patient reports pain this morning but states she hasd good weekend   Currently in Pain? Yes   Pain Score 8    Pain Location Back   Pain Orientation Lower   Pain Type Chronic pain          [facility] PT Assessment - 05/17/14 0001    Observation/Other Assessments   Focus on Therapeutic Outcomes (FOTO)  3                  [facility] Adult PT Treatment/Exercise - 05/17/14 0001    Lumbar Exercises: Stretches   Passive Hamstring Stretch 2 reps;30 seconds   Passive Hamstring Stretch Limitations Gastroc Stretch, Slantboard   Single Knee to Chest Stretch 2 reps;20 seconds   Lumbar Exercises: Standing   Other Standing Lumbar Exercises Side stepping x2 approx 41ft  20 seconds max   Other Standing Lumbar Exercises Standing marches; forward toe taps; mini-squats  25 seconds max   Lumbar Exercises: Supine   Bridge 10 reps   Other Supine Lumbar Exercises quad sets 1x10   Lumbar Exercises: Sidelying   Hip Abduction 10 reps   Hip Abduction Weights (lbs) 0                  PT Short  Term Goals - 05/07/14 0831    PT SHORT TERM GOAL #1   Title I in HEP   Status On-going   PT SHORT TERM GOAL #2   Title Pt to be able to stand for two minutes   Status On-going   PT SHORT TERM GOAL #3   Title Pt to be able to walk behind wheelchair for five minues   Status On-going   PT SHORT TERM GOAL #4   Title Pain level to be no greater than a 7/10   Status On-going           PT Long Term Goals - 05/07/14 1610    PT LONG TERM GOAL #1   Title I in advance HEP   Status On-going   PT LONG TERM GOAL #2   Title 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 Required frequent supine rest breaks due to back pain on this date. FOTO performed . Patient limited by fatigue and back pain on this date, education regarding use of wheeling chair around house to assist in increasing functional endurance.    Pt will benefit from skilled therapeutic intervention in order to improve on the following deficits Decreased activity tolerance;Decreased balance;Decreased coordination;Decreased mobility;Pain;Difficulty walking;Decreased strength   Rehab Potential Good   PT Frequency 3x / week   PT Duration 8 weeks   PT Treatment/Interventions ADLs/Self Care Home Management;Functional mobility training;Therapeutic activities;Therapeutic exercise;Balance training;Patient/family education   PT Next Visit Plan Continue with slower progression. Functional standing strengthening/balance tasks. Functional endurance.    Consulted and Agree with Plan of Care Patient          G-Codes - 05-23-14 5409    Functional Limitation Other PT primary   Other PT Primary Current Status [phone]) At least 40 percent but less than 60 percent impaired, limited or restricted   Other PT Primary Goal Status (Y7829) At least 20 percent but less than 40 percent impaired, limited or restricted      Problem List Patient Active Problem List   Diagnosis Date Noted  . Elevated liver enzymes 12/16/2013  . Tremor 06/24/2013  . Encounter for therapeutic drug monitoring 05/18/2013  . Dizziness 01/12/2013  . Wound of ankle 10/23/2010  . Long term current use of anticoagulant 07/05/2010  . Occlusion and stenosis of carotid artery without mention of cerebral infarction 11/01/2009  . HYPERLIPIDEMIA 10/26/2009  . HYPERTENSION 10/26/2009  . ATRIAL  FIBRILLATION 10/26/2009  . ACUTE ON CHRONIC DIASTOLIC HEART FAILURE 56/21/3086  . SYNCOPE AND COLLAPSE 10/26/2009    Deniece Ree PT, DPT [place] [address] [place], [place], [address] Phone: [phone]   Fax:  [phone]

## 2014-05-19 ENCOUNTER — Encounter (HOSPITAL_COMMUNITY): Payer: Medicare Other | Admitting: Physical Therapy

## 2014-05-19 ENCOUNTER — Ambulatory Visit (HOSPITAL_COMMUNITY)
Admission: RE | Admit: 2014-05-19 | Discharge: 2014-05-19 | Disposition: A | Discharge status: Home / Self Care | Payer: Medicare Other | Source: Ambulatory Visit | Attending: Orthopaedic Surgery | Admitting: Orthopaedic Surgery

## 2014-05-19 DIAGNOSIS — M6281 Muscle weakness (generalized): Secondary | ICD-10-CM | POA: Diagnosis not present

## 2014-05-19 DIAGNOSIS — J189 Pneumonia, unspecified organism: Secondary | ICD-10-CM

## 2014-05-19 DIAGNOSIS — R262 Difficulty in walking, not elsewhere classified: Secondary | ICD-10-CM

## 2014-05-19 DIAGNOSIS — R29898 Other symptoms and signs involving the musculoskeletal system: Secondary | ICD-10-CM

## 2014-05-19 DIAGNOSIS — R2689 Other abnormalities of gait and mobility: Secondary | ICD-10-CM

## 2014-05-19 HISTORY — DX: Pneumonia, unspecified organism: J18.9

## 2014-05-19 NOTE — Therapy (Signed)
[place], [place], [address] Phone: [phone]   Fax:  [phone]  Physical Therapy  Patient Details  Name: [name] MRN: [id number] Date of Birth: 1940-11-19 Referring Provider:  Sanjuana Kava, MD  Encounter Date: 05/19/2014      PT End of Session - 05/19/14 0845    Visit Number 11   Number of Visits 47   Authorization - Visit Number 11   Authorization - Number of Visits 24   PT Start Time 0800   PT Stop Time 1749   PT Time Calculation (min) 55 min      Past Medical History  Diagnosis Date  . Subclavian steal syndrome   . Carotid bruit     LICA 44-96% (duplex 7/59)  . Hypertension   . Hyperlipidemia   . Atrial fibrillation     x 3 yrs  . Anticoagulation management encounter   . Meningioma   . Osteoporosis   . Stroke     CVA 2008  . Pneumonia   . Breast cancer     S/P left mastectomy, 1989 remained in remission  . Anemia     Past Surgical History  Procedure Laterality Date  . Cardiac catheterization    . Mastectomy Left 1990  . Cataract extraction Bilateral 2013  . Colonoscopy  11/17/2010    Procedure: COLONOSCOPY;  Surgeon: [name], MD;  [facility];  Service: Endoscopy;  Laterality: N/A;  10:45 am  . Esophagogastroduodenoscopy  11/17/2010    Procedure: ESOPHAGOGASTRODUODENOSCOPY (EGD);  Surgeon: [name], MD;  [facility];  Service: Endoscopy;  Laterality: N/A;  . Lumbar epidural injection  06-2012--06-2013    pt. states she has had 5 epidurals in 06-2012----06-2013  . Eye surgery      There were no vitals taken for this visit.  Visit Diagnosis:  Leg weakness, bilateral  Poor balance  Difficulty walking      Subjective Assessment - 05/19/14 1430    Symptoms Pt states that she feels as if she has been working to hard for the first three weeks but she is doing better now    How long can you sit comfortably? 30 minutes   How long can you  stand comfortably? able to stand for 90 seconds was unable to stand   How long can you walk comfortably? Pt able to walk for 85 feet pushing wheelchair was 30 ft.    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 Yes   Pain Score 10-Worst pain ever   Pain Location Back   Pain Orientation Lower          [facility] PT Assessment - 05/19/14 0001    Strength   Right Hip Flexion 3+/5   Right Hip Extension 3/5  was 2+/5    Right Hip ABduction 3-/5  was 3-/5    Left Hip Flexion 3/5   Left Hip Extension 3-/5  was 2/5   Left Hip ABduction 3-/5   Right Knee Flexion 3+/5  was 3+/5   Left Knee Flexion 3+/5   Left Knee Extension --  was 3+5   Right Ankle Dorsiflexion 2+/5   Right Ankle Plantar Flexion 2+/5   Left Ankle Dorsiflexion 2/5   Left Ankle Plantar Flexion 3-/5  [facility] Adult PT Treatment/Exercise - 05/19/14 0001    Exercises   Exercises Lumbar   Lumbar Exercises: Stretches   Prone on Elbows Stretch 3 reps;60 seconds   Lumbar Exercises: Aerobic   Tread Mill GT x 85 ft    Lumbar Exercises: Standing   Heel Raises 10 reps   Functional Squats 10 reps   Other Standing Lumbar Exercises Side stepping x2 approx 19ft  20 seconds max   Lumbar Exercises: Seated   Other Seated Lumbar Exercises Pulling into upright position.   Other Seated Lumbar Exercises W-back   Lumbar Exercises: Prone   Straight Leg Raise 15 reps   Other Prone Lumbar Exercises Glut sets/ heel squeeze x 10                PT Education - 05/19/14 1433    Education provided Yes   Education Details POE, prone SLR   Person(s) Educated Patient   Methods Explanation;Demonstration   Comprehension Verbalized understanding;Returned demonstration          PT Short Term Goals - 05/19/14 0846    PT SHORT TERM GOAL #1   Title I in HEP   Time 1   Status Achieved   PT SHORT TERM GOAL #2   Title Pt to be able to  stand for two minutes   Baseline able to stand for 75 seconds was unable    Time 4   Status On-going   PT SHORT TERM GOAL #3   Title Pt to be able to walk behind wheelchair for five minues   Baseline 90 seconds was 30 seconds    Time 4   Period Weeks   Status On-going   PT SHORT TERM GOAL #4   Title Pain level to be no greater than a 7/10   Baseline 1-/10 was 20/10           PT Long Term Goals - 05/07/14 4782    PT LONG TERM GOAL #1   Title I in advance HEP   Status On-going   PT LONG TERM GOAL #2   Title 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  Pt continues to have high pain but she also continues to be very weak.  Pt will benefit from continued therapy to improve pain for quality of life and strength for functional mobiliy.   PT Next Visit Plan Continue with slower progression. Functional standing strengthening/balance tasks. Functional endurance.    PT Home Exercise Plan given         Problem List Patient Active Problem List   Diagnosis Date Noted  . Elevated liver enzymes 12/16/2013  . Tremor 06/24/2013  . Encounter for therapeutic drug monitoring 05/18/2013  . Dizziness 01/12/2013  . Wound of ankle 10/23/2010  . Long term current use of anticoagulant 07/05/2010  . Occlusion and stenosis of carotid artery without mention of cerebral infarction 11/01/2009  . HYPERLIPIDEMIA 10/26/2009  . HYPERTENSION 10/26/2009  .  ATRIAL FIBRILLATION 10/26/2009  . ACUTE ON CHRONIC DIASTOLIC HEART FAILURE 17/00/1749  . SYNCOPE AND COLLAPSE 10/26/2009    RUSSELL,[name]  PT  05/19/2014, 2:36 PM  [facility] [address] [place], [place], [address] Phone: [phone]   Fax:  [phone]

## 2014-05-19 NOTE — Patient Instructions (Signed)
Heel Squeeze (Prone)   Abdomen supported, bend knees and gently squeeze heels together. Hold 3____ seconds. Repeat __10__ times per set. Do __1__ sets per session. Do __3__ sessions per day. 1 http://orth.exer.us/1080   Copyright  VHI. All rights reserved.  Gluteal Sets   Tighten buttocks while pressing pelvis to floor. Hold _5___ seconds. Repeat _10___ times per set. Do ___1_ sets per session. Do __3__ sessions per day.  http://orth.exer.us/104   Copyright  VHI. All rights reserved.  On Elbows (Prone)   Rise up on elbows as high as possible, keeping hips on floor. Hold __60__ seconds. Repeat __4__ times per set. Do __1__ sets per session. Do ____3 sessions per day.  http://orth.exer.us/92   Copyright  VHI. All rights reserved.  Strengthening: Hip Abduction (Side-Lying)   Tighten muscles on front of left thigh, then lift leg __12__ inches from surface, keeping knee locked.  Repeat __10__ times per set. Do _1__ sets per session. Do __3__ sessions per day.  http://orth.exer.us/622   Copyright  VHI. All rights reserved.

## 2014-05-20 ENCOUNTER — Other Ambulatory Visit: Payer: Self-pay

## 2014-05-20 ENCOUNTER — Encounter (HOSPITAL_COMMUNITY): Payer: Self-pay | Admitting: Emergency Medicine

## 2014-05-20 ENCOUNTER — Other Ambulatory Visit (HOSPITAL_COMMUNITY): Payer: Self-pay

## 2014-05-20 ENCOUNTER — Inpatient Hospital Stay (HOSPITAL_COMMUNITY)
Admission: EM | Admit: 2014-05-20 | Discharge: 2014-05-24 | DRG: 177 | Disposition: A | Discharge status: Home / Self Care | Payer: Medicare Other | Attending: Internal Medicine | Admitting: Internal Medicine

## 2014-05-20 ENCOUNTER — Telehealth (HOSPITAL_COMMUNITY): Payer: Self-pay

## 2014-05-20 ENCOUNTER — Emergency Department (HOSPITAL_COMMUNITY): Payer: Medicare Other

## 2014-05-20 DIAGNOSIS — Z7982 Long term (current) use of aspirin: Secondary | ICD-10-CM | POA: Diagnosis not present

## 2014-05-20 DIAGNOSIS — E785 Hyperlipidemia, unspecified: Secondary | ICD-10-CM | POA: Diagnosis present

## 2014-05-20 DIAGNOSIS — D72829 Elevated white blood cell count, unspecified: Secondary | ICD-10-CM | POA: Diagnosis not present

## 2014-05-20 DIAGNOSIS — Z9012 Acquired absence of left breast and nipple: Secondary | ICD-10-CM | POA: Diagnosis present

## 2014-05-20 DIAGNOSIS — E440 Moderate protein-calorie malnutrition: Secondary | ICD-10-CM | POA: Insufficient documentation

## 2014-05-20 DIAGNOSIS — Z7901 Long term (current) use of anticoagulants: Secondary | ICD-10-CM | POA: Diagnosis not present

## 2014-05-20 DIAGNOSIS — R531 Weakness: Secondary | ICD-10-CM | POA: Diagnosis not present

## 2014-05-20 DIAGNOSIS — Z853 Personal history of malignant neoplasm of breast: Secondary | ICD-10-CM

## 2014-05-20 DIAGNOSIS — J984 Other disorders of lung: Secondary | ICD-10-CM | POA: Diagnosis not present

## 2014-05-20 DIAGNOSIS — I4819 Other persistent atrial fibrillation: Secondary | ICD-10-CM | POA: Diagnosis present

## 2014-05-20 DIAGNOSIS — E43 Unspecified severe protein-calorie malnutrition: Secondary | ICD-10-CM | POA: Diagnosis not present

## 2014-05-20 DIAGNOSIS — Z820 Family history of epilepsy and other diseases of the nervous system: Secondary | ICD-10-CM | POA: Diagnosis not present

## 2014-05-20 DIAGNOSIS — R9389 Abnormal findings on diagnostic imaging of other specified body structures: Secondary | ICD-10-CM

## 2014-05-20 DIAGNOSIS — I10 Essential (primary) hypertension: Secondary | ICD-10-CM | POA: Diagnosis present

## 2014-05-20 DIAGNOSIS — J449 Chronic obstructive pulmonary disease, unspecified: Secondary | ICD-10-CM | POA: Diagnosis not present

## 2014-05-20 DIAGNOSIS — M1990 Unspecified osteoarthritis, unspecified site: Secondary | ICD-10-CM | POA: Diagnosis present

## 2014-05-20 DIAGNOSIS — J189 Pneumonia, unspecified organism: Secondary | ICD-10-CM | POA: Diagnosis present

## 2014-05-20 DIAGNOSIS — Z8673 Personal history of transient ischemic attack (TIA), and cerebral infarction without residual deficits: Secondary | ICD-10-CM

## 2014-05-20 DIAGNOSIS — M810 Age-related osteoporosis without current pathological fracture: Secondary | ICD-10-CM | POA: Diagnosis present

## 2014-05-20 DIAGNOSIS — Z681 Body mass index (BMI) 19 or less, adult: Secondary | ICD-10-CM

## 2014-05-20 DIAGNOSIS — G8929 Other chronic pain: Secondary | ICD-10-CM | POA: Diagnosis present

## 2014-05-20 DIAGNOSIS — Z993 Dependence on wheelchair: Secondary | ICD-10-CM

## 2014-05-20 DIAGNOSIS — D638 Anemia in other chronic diseases classified elsewhere: Secondary | ICD-10-CM | POA: Diagnosis present

## 2014-05-20 DIAGNOSIS — J850 Gangrene and necrosis of lung: Secondary | ICD-10-CM | POA: Diagnosis not present

## 2014-05-20 DIAGNOSIS — D649 Anemia, unspecified: Secondary | ICD-10-CM | POA: Diagnosis present

## 2014-05-20 DIAGNOSIS — Z8249 Family history of ischemic heart disease and other diseases of the circulatory system: Secondary | ICD-10-CM

## 2014-05-20 DIAGNOSIS — I4891 Unspecified atrial fibrillation: Secondary | ICD-10-CM | POA: Diagnosis present

## 2014-05-20 DIAGNOSIS — Z87891 Personal history of nicotine dependence: Secondary | ICD-10-CM | POA: Diagnosis not present

## 2014-05-20 DIAGNOSIS — D473 Essential (hemorrhagic) thrombocythemia: Secondary | ICD-10-CM | POA: Diagnosis present

## 2014-05-20 DIAGNOSIS — R1084 Generalized abdominal pain: Secondary | ICD-10-CM | POA: Diagnosis not present

## 2014-05-20 DIAGNOSIS — M419 Scoliosis, unspecified: Secondary | ICD-10-CM | POA: Diagnosis present

## 2014-05-20 DIAGNOSIS — D75839 Thrombocytosis, unspecified: Secondary | ICD-10-CM | POA: Diagnosis present

## 2014-05-20 DIAGNOSIS — I480 Paroxysmal atrial fibrillation: Secondary | ICD-10-CM | POA: Diagnosis not present

## 2014-05-20 DIAGNOSIS — R911 Solitary pulmonary nodule: Secondary | ICD-10-CM | POA: Diagnosis not present

## 2014-05-20 DIAGNOSIS — K5900 Constipation, unspecified: Secondary | ICD-10-CM | POA: Diagnosis not present

## 2014-05-20 LAB — CBC WITH DIFFERENTIAL/PLATELET
Basophils Absolute: 0 10*3/uL (ref 0.0–0.1)
Basophils Relative: 0 % (ref 0–1)
EOS ABS: 0 10*3/uL (ref 0.0–0.7)
EOS PCT: 0 % (ref 0–5)
HCT: 26.2 % — ABNORMAL LOW (ref 36.0–46.0)
HEMOGLOBIN: 8.5 g/dL — AB (ref 12.0–15.0)
LYMPHS ABS: 2 10*3/uL (ref 0.7–4.0)
Lymphocytes Relative: 7 % — ABNORMAL LOW (ref 12–46)
MCH: 28.5 pg (ref 26.0–34.0)
MCHC: 32.4 g/dL (ref 30.0–36.0)
MCV: 87.9 fL (ref 78.0–100.0)
MONO ABS: 2.4 10*3/uL — AB (ref 0.1–1.0)
MONOS PCT: 8 % (ref 3–12)
Neutro Abs: 25.1 10*3/uL — ABNORMAL HIGH (ref 1.7–7.7)
Neutrophils Relative %: 85 % — ABNORMAL HIGH (ref 43–77)
RBC: 2.98 MIL/uL — ABNORMAL LOW (ref 3.87–5.11)
RDW: 15.7 % — ABNORMAL HIGH (ref 11.5–15.5)
WBC: 29.6 10*3/uL — ABNORMAL HIGH (ref 4.0–10.5)

## 2014-05-20 LAB — TROPONIN I: Troponin I: <0.03 ng/mL (ref <0.031)

## 2014-05-20 LAB — COMPREHENSIVE METABOLIC PANEL
ALBUMIN: 2.1 g/dL — AB (ref 3.5–5.2)
ALK PHOS: 203 U/L — AB (ref 39–117)
ALT: 28 U/L (ref 0–35)
ANION GAP: 7 (ref 5–15)
AST: 22 U/L (ref 0–37)
BUN: 14 mg/dL (ref 6–23)
CO2: 22 mmol/L (ref 19–32)
Calcium: 8.1 mg/dL — ABNORMAL LOW (ref 8.4–10.5)
Chloride: 100 mmol/L (ref 96–112)
Creatinine, Ser: 0.66 mg/dL (ref 0.50–1.10)
GFR calc Af Amer: >90 mL/min (ref >90)
GFR calc non Af Amer: 84 mL/min — ABNORMAL LOW (ref >90)
GLUCOSE: 103 mg/dL — AB (ref 70–99)
POTASSIUM: 4.2 mmol/L (ref 3.5–5.1)
SODIUM: 129 mmol/L — AB (ref 135–145)
TOTAL PROTEIN: 7.3 g/dL (ref 6.0–8.3)
Total Bilirubin: 0.4 mg/dL (ref 0.3–1.2)

## 2014-05-20 LAB — URINALYSIS, ROUTINE W REFLEX MICROSCOPIC
BILIRUBIN URINE: NEGATIVE
Glucose, UA: NEGATIVE mg/dL
KETONES UR: NEGATIVE mg/dL
LEUKOCYTES UA: NEGATIVE
NITRITE: NEGATIVE
Protein, ur: NEGATIVE mg/dL
Specific Gravity, Urine: 1.025 (ref 1.005–1.030)
UROBILINOGEN UA: 0.2 mg/dL (ref 0.0–1.0)
pH: 5.5 (ref 5.0–8.0)

## 2014-05-20 LAB — URINE MICROSCOPIC-ADD ON

## 2014-05-20 LAB — POC OCCULT BLOOD, ED: Fecal Occult Bld: NEGATIVE

## 2014-05-20 LAB — PROTIME-INR
INR: 2.45 — ABNORMAL HIGH (ref 0.00–1.49)
Prothrombin Time: 26.8 seconds — ABNORMAL HIGH (ref 11.6–15.2)

## 2014-05-20 LAB — I-STAT CG4 LACTIC ACID, ED: Lactic Acid, Venous: 0.9 mmol/L (ref 0.5–2.0)

## 2014-05-20 LAB — LIPASE, BLOOD: Lipase: 31 U/L (ref 11–59)

## 2014-05-20 MED ORDER — SODIUM CHLORIDE 0.9 % IV BOLUS (SEPSIS)
1000.0000 mL | Freq: Once | INTRAVENOUS | Status: AC
Start: 2014-05-20 — End: 2014-05-20
  Administered 2014-05-20: 1000 mL via INTRAVENOUS

## 2014-05-20 MED ORDER — ONDANSETRON HCL 4 MG PO TABS: 4.0000 mg | ORAL_TABLET | Freq: Four times a day (QID) | ORAL | Status: DC | PRN

## 2014-05-20 MED ORDER — DEXTROSE 5 % IV SOLN: 1.0000 g | Freq: Once | INTRAVENOUS | Status: DC

## 2014-05-20 MED ORDER — AMIODARONE HCL 200 MG PO TABS
200.0000 mg | ORAL_TABLET | Freq: Every day | ORAL | Status: DC
  Administered 2014-05-21 – 2014-05-24 (×4): 200 mg via ORAL
  Filled 2014-05-20 (×4): qty 1

## 2014-05-20 MED ORDER — SODIUM CHLORIDE 0.9 % IV SOLN
INTRAVENOUS | Status: AC
  Administered 2014-05-20: 22:00:00 via INTRAVENOUS

## 2014-05-20 MED ORDER — ONDANSETRON HCL 4 MG/2ML IJ SOLN: 4.0000 mg | Freq: Four times a day (QID) | INTRAMUSCULAR | Status: DC | PRN

## 2014-05-20 MED ORDER — ENOXAPARIN SODIUM 40 MG/0.4ML ~~LOC~~ SOLN
40.0000 mg | SUBCUTANEOUS | Status: DC
  Administered 2014-05-21: 40 mg via SUBCUTANEOUS
  Filled 2014-05-20: qty 0.4

## 2014-05-20 MED ORDER — SODIUM CHLORIDE 0.9 % IV SOLN
INTRAVENOUS | Status: DC
  Administered 2014-05-20 – 2014-05-23 (×4): via INTRAVENOUS

## 2014-05-20 MED ORDER — PRAVASTATIN SODIUM 40 MG PO TABS
40.0000 mg | ORAL_TABLET | Freq: Every day | ORAL | Status: DC
  Administered 2014-05-21 – 2014-05-24 (×4): 40 mg via ORAL
  Filled 2014-05-20 (×4): qty 1

## 2014-05-20 MED ORDER — VANCOMYCIN HCL IN DEXTROSE 1-5 GM/200ML-% IV SOLN
1000.0000 mg | Freq: Once | INTRAVENOUS | Status: AC
Start: 2014-05-20 — End: 2014-05-21
  Administered 2014-05-20: 1000 mg via INTRAVENOUS
  Filled 2014-05-20: qty 200

## 2014-05-20 MED ORDER — FENTANYL CITRATE 0.05 MG/ML IJ SOLN
50.0000 ug | Freq: Once | INTRAMUSCULAR | Status: AC
  Administered 2014-05-20: 50 ug via INTRAVENOUS
  Filled 2014-05-20: qty 2

## 2014-05-20 MED ORDER — DEXTROSE 5 % IV SOLN: 500.0000 mg | Freq: Once | INTRAVENOUS | Status: DC

## 2014-05-20 MED ORDER — METOPROLOL TARTRATE 50 MG PO TABS
50.0000 mg | ORAL_TABLET | Freq: Two times a day (BID) | ORAL | Status: DC
  Administered 2014-05-21 – 2014-05-24 (×8): 50 mg via ORAL
  Filled 2014-05-20 (×8): qty 1

## 2014-05-20 MED ORDER — FENTANYL CITRATE 0.05 MG/ML IJ SOLN
25.0000 ug | INTRAMUSCULAR | Status: DC | PRN
  Administered 2014-05-20 – 2014-05-23 (×8): 25 ug via INTRAVENOUS
  Filled 2014-05-20 (×8): qty 2

## 2014-05-20 MED ORDER — SODIUM CHLORIDE 0.9 % IJ SOLN
3.0000 mL | Freq: Two times a day (BID) | INTRAMUSCULAR | Status: DC
  Administered 2014-05-21 – 2014-05-24 (×3): 3 mL via INTRAVENOUS

## 2014-05-20 MED ORDER — DEXTROSE 5 % IV SOLN
2.0000 g | Freq: Once | INTRAVENOUS | Status: AC
  Administered 2014-05-20: 2 g via INTRAVENOUS
  Filled 2014-05-20: qty 2

## 2014-05-20 MED ORDER — COLESEVELAM HCL 625 MG PO TABS
1875.0000 mg | ORAL_TABLET | Freq: Two times a day (BID) | ORAL | Status: DC
  Administered 2014-05-21 – 2014-05-24 (×7): 1875 mg via ORAL
  Filled 2014-05-20 (×11): qty 3

## 2014-05-20 MED ORDER — ACETAMINOPHEN 500 MG PO TABS
1000.0000 mg | ORAL_TABLET | Freq: Once | ORAL | Status: AC
  Administered 2014-05-20: 1000 mg via ORAL
  Filled 2014-05-20: qty 2

## 2014-05-20 MED ORDER — CLOPIDOGREL BISULFATE 75 MG PO TABS
75.0000 mg | ORAL_TABLET | Freq: Every day | ORAL | Status: DC
  Administered 2014-05-21 – 2014-05-24 (×4): 75 mg via ORAL
  Filled 2014-05-20 (×4): qty 1

## 2014-05-20 MED ORDER — ASPIRIN EC 81 MG PO TBEC
81.0000 mg | DELAYED_RELEASE_TABLET | Freq: Every morning | ORAL | Status: DC
  Administered 2014-05-21 – 2014-05-24 (×4): 81 mg via ORAL
  Filled 2014-05-20 (×4): qty 1

## 2014-05-20 NOTE — ED Notes (Signed)
Pt states that she has been having bad back pain for the past 3 days.  States she has chronic compression fractures and is currently taking rehab for back pain.

## 2014-05-20 NOTE — ED Notes (Addendum)
Pt has a block artery on right side unable to had blood pressure in this arm, left side mastectomy, per Dr. Darrick Meigs and Dr. Leonides Schanz, okay to have IV on left side.

## 2014-05-20 NOTE — Telephone Encounter (Signed)
Husband called and said [name] is doing very poorly and cannot come in today

## 2014-05-20 NOTE — H&P (Addendum)
PCP:   Delphina Cahill, MD   Chief Complaint:  Generalized weakness   HPI:   74 year old female who   has a past medical history of Subclavian steal syndrome; Carotid bruit; Hypertension; Hyperlipidemia; Atrial fibrillation; Anticoagulation management encounter; Meningioma; Osteoporosis; Stroke; Pneumonia; Breast cancer; and Anemia. Today presented to the ED with chief complaint of generalized weakness, poor by mouth intake and 15 pounds weight loss over past several weeks. Patient has a history of chronic back pain with scoliosis, osteoarthritis and T12 compression fracture. Patient takes oxycodone for the back pain. She denies nausea vomiting or diarrhea. No dysuria urgency frequency of urination. She denies chest pain no shortness of breath. She has a remote history of breast cancer with left mastectomy in 1989.   in the ED CT chest was done was done which showed masslike area of consolidation in the right lower lobe which may reflect pneumonia with necrosis. New plaster disease possible.  Allergies:   Allergies  Allergen Reactions  . Iodine Rash and Other (See Comments)    BETADINE Rash/burning, blisters on skin.  . Iohexol Rash and Other (See Comments)    Blisters; PT NEEDS 13-HOUR PREP   . Tape Itching and Rash    Prefers PAPER TAPE      Past Medical History  Diagnosis Date  . Subclavian steal syndrome   . Carotid bruit     LICA 78-29% (duplex 5/62)  . Hypertension   . Hyperlipidemia   . Atrial fibrillation     x 3 yrs  . Anticoagulation management encounter   . Meningioma   . Osteoporosis   . Stroke     CVA 2008  . Pneumonia   . Breast cancer     S/P left mastectomy, 1989 remained in remission  . Anemia     Past Surgical History  Procedure Laterality Date  . Cardiac catheterization    . Mastectomy Left 1990  . Cataract extraction Bilateral 2013  . Colonoscopy  11/17/2010    Procedure: COLONOSCOPY;  Surgeon: [name], MD;  Location: [facility];   Service: Endoscopy;  Laterality: N/A;  10:45 am  . Esophagogastroduodenoscopy  11/17/2010    Procedure: ESOPHAGOGASTRODUODENOSCOPY (EGD);  Surgeon: [name], MD;  Location: [facility];  Service: Endoscopy;  Laterality: N/A;  . Lumbar epidural injection  06-2012--06-2013    pt. states she has had 5 epidurals in 06-2012----06-2013  . Eye surgery      Prior to Admission medications   Medication Sig Start Date End Date Taking? Authorizing Provider  acetaminophen (TYLENOL) 500 MG tablet Take 1,000 mg by mouth every 6 (six) hours as needed for mild pain or moderate pain.    Yes Historical Provider, MD  alendronate (FOSAMAX) 70 MG tablet Take 70 mg by mouth every Sunday. Take in the morning with a full glass of water, on an empty stomach, and do not take anything else by mouth or lie down for the next 30 min. Patient take on Sunday   Yes Historical Provider, MD  amiodarone (PACERONE) 200 MG tablet Take 1 tablet by mouth  daily Patient taking differently: Take 1 tablet by mouth  daily in the morning 12/22/13  Yes Josue Hector, MD  amLODipine (NORVASC) 10 MG tablet Take 10 mg by mouth every evening.    Yes Historical Provider, MD  aspirin EC 81 MG tablet Take 81 mg by mouth every morning.    Yes Historical Provider, MD  calcium-vitamin D (OSCAL) 250-125 MG-UNIT per  tablet Take 1 tablet by mouth 2 (two) times daily.     Yes Historical Provider, MD  clopidogrel (PLAVIX) 75 MG tablet Take 1 tablet by mouth  every morning 03/15/14  Yes Josue Hector, MD  irbesartan (AVAPRO) 300 MG tablet Take 300 mg by mouth every evening.    Yes Historical Provider, MD  Iron-FA-B Cmp-C-Biot-Probiotic (FUSION PLUS) CAPS Take 1 capsule by mouth every morning.    Yes Historical Provider, MD  magnesium oxide (MAG-OX) 400 MG tablet Take 400 mg by mouth every morning.    Yes Historical Provider, MD  meloxicam (MOBIC) 15 MG tablet Take 15 mg by mouth every morning.     Yes Historical Provider, MD  metoprolol tartrate  (LOPRESSOR) 25 MG tablet Take 3 tablets (75 mg  total) by mouth 2 (two)  times daily. 02/03/14  Yes Josue Hector, MD  Multiple Vitamin (MULTIVITAMIN WITH MINERALS) TABS tablet Take 1 tablet by mouth every morning.   Yes Historical Provider, MD  Omega-3 Fatty Acids (FISH OIL) 1200 MG CAPS Take 1,200 mg by mouth 2 (two) times daily.   Yes Historical Provider, MD  oxyCODONE-acetaminophen (PERCOCET) 7.5-325 MG per tablet Take 0.5 tablets by mouth at bedtime as needed. For pain 04/16/14  Yes Historical Provider, MD  pravastatin (PRAVACHOL) 40 MG tablet Take 1 tablet by mouth  every night at bedtime 03/08/14  Yes Josue Hector, MD  vitamin C (ASCORBIC ACID) 500 MG tablet Take 500 mg by mouth every morning.    Yes Historical Provider, MD  warfarin (COUMADIN) 1 MG tablet Take 1 tablet (1 mg total) by mouth one time only at 6 PM. Patient taking differently: Take 2-4 mg by mouth every evening. 2mg  on all days except Mondays. Take 4mg  on Mondays 04/06/14  Yes Josue Hector, MD  [facility] 625 MG tablet Take 3 tablets by mouth  twice a day with a meal 11/13/13  Yes Josue Hector, MD    Social History:  reports that she quit smoking about 7 years ago. Her smoking use included Cigarettes. She has a 56 pack-year smoking history. She has never used smokeless tobacco. She reports that she does not drink alcohol or use illicit drugs.  Family History  Problem Relation Age of Onset  . Alzheimer's disease Mother   . Dementia Mother   . Hypertension Mother   . Parkinsonism Father      All the positives are listed in BOLD  Review of Systems:  HEENT: Headache, blurred vision, runny nose, sore throat Neck: Hypothyroidism, hyperthyroidism,,lymphadenopathy Chest : Shortness of breath, history of COPD, Asthma Heart : Chest pain, history of coronary arterey disease, atrial fibrillation GI:  Nausea, vomiting, diarrhea, constipation, GERD GU: Dysuria, urgency, frequency of urination, hematuria Neuro: Stroke,  seizures, syncope Psych: Depression, anxiety, hallucinations   Physical Exam: Blood pressure 117/61, pulse 91, temperature 100.3 F (37.9 C), temperature source Rectal, resp. rate 33, height 5\' 5"  (1.651 m), weight 45.36 kg (100 lb), SpO2 93 %. Constitutional:   Patient is a well-developed and well-nourished *female  in no acute distress and cooperative with exam. Head: Normocephalic and atraumatic Mouth: Mucus membranes moist Eyes: PERRL, EOMI, conjunctivae normal Neck: Supple, No Thyromegaly Cardiovascular: RRR, S1 normal, S2 normal Pulmonary/Chest: CTAB, no wheezes, rales, or rhonchi Abdominal: Soft. Non-tender, non-distended, bowel sounds are normal, no masses, organomegaly, or guarding present.  Neurological: A&O x3, Strength is normal and symmetric bilaterally, cranial nerve II-XII are grossly intact, no focal motor deficit, sensory intact to light  touch bilaterally.  Extremities : No Cyanosis, Clubbing or Edema  Labs on Admission:  Basic Metabolic Panel:  Recent Labs Lab 05/20/14 1803  NA 129*  K 4.2  CL 100  CO2 22  GLUCOSE 103*  BUN 14  CREATININE 0.66  CALCIUM 8.1*   Liver Function Tests:  Recent Labs Lab 05/20/14 1803  AST 22  ALT 28  ALKPHOS 203*  BILITOT 0.4  PROT 7.3  ALBUMIN 2.1*    Recent Labs Lab 05/20/14 1803  LIPASE 31   No results for input(s): AMMONIA in the last 168 hours. CBC:  Recent Labs Lab 05/20/14 1803  WBC 29.6*  NEUTROABS 25.1*  HGB 8.5*  HCT 26.2*  MCV 87.9  PLT SPECIMEN CHECKED FOR CLOTS   Cardiac Enzymes:  Recent Labs Lab 05/20/14 1803  TROPONINI <0.03    BNP (last 3 results) No results for input(s): BNP in the last 8760 hours.  ProBNP (last 3 results) No results for input(s): PROBNP in the last 8760 hours.  CBG: No results for input(s): GLUCAP in the last 168 hours.  Radiological Exams on Admission: Ct Chest Wo Contrast  05/20/2014   CLINICAL DATA:  Abnormal chest radiograph tonight. Consolidation  noted in the right lower lung on acute abdominal series. Patient has leukocytosis, weakness and cough for 1 week.  EXAM: CT CHEST WITHOUT CONTRAST  TECHNIQUE: Multidetector CT imaging of the chest was performed following the standard protocol without IV contrast.  COMPARISON:  Current chest radiograph.  FINDINGS: There is a heterogeneous masslike area of consolidation in the posterior right lower lobe. This contains mostly fluid attenuation with intervening areas of higher attenuation. It is surrounded by patchy airspace and coarse reticular type opacities. Masslike area measures 9.4 cm x 7 cm x 9.3 cm. It has mass effect bulging the right oblique fissure anteriorly and compressing adjacent lung. There are air bubbles within this.  There are few areas of mild reticular opacity in the posterior right upper lobe likely scarring and/ or atelectasis. A 4.4 mm nodule lies in the left lower lobe, image 42, series 3. Pleural-parenchymal scarring is noted at the apices, most prominently on the right. Mild changes of centrilobular emphysema are noted in the upper lobes.  There is mild right hilar and mediastinal adenopathy. A precarinal node measures 1 cm short axis.  Heart is normal in size and configuration. There are moderate coronary artery calcifications. Great vessels are normal in caliber.  There is a small hiatal hernia. Limited evaluation of the upper abdomen demonstrates aortic and branch vessel atherosclerotic calcifications but is otherwise unremarkable. No adrenal masses.  Marked compression fracture of T12. Large Schmorl's node noted in the upper endplate of L2. No osteoblastic or osteolytic lesions.  IMPRESSION: 1. Masslike area of consolidation in the right lower lobe. This may reflect pneumonia with necrosis. Neoplastic disease is possible. Coarse reticular and patchy airspace lung opacity in the adjacent along is likely combination of infection and compressive atelectasis. 2. Mild right hilar and  mediastinal adenopathy. 3. Underlying COPD. Given the patient's symptoms of cough and elevated white blood cell count, necrotic pneumonia is felt to be the most likely etiology. However, if the patient does not respond to treatment for pneumonia, biopsy would be indicated.   Electronically Signed   By: Lajean Manes M.D.   On: 05/20/2014 20:11   Dg Abd Acute W/chest  05/20/2014   CLINICAL DATA:  Generalized abdominal pain for 1 week. Chronic back pain worse for 3 days. Chronic compression fractures.  History of breast cancer post mastectomy 1989.  EXAM: ACUTE ABDOMEN SERIES (ABDOMEN 2 VIEW & CHEST 1 VIEW)  COMPARISON:  Chest 01/13/2013 and 07/28/2010.  FINDINGS: There is a large masslike opacity in the right mid lung. CT is recommended to exclude neoplasm. Localized pneumonia is also within the differential diagnosis. Diffuse emphysematous changes in the lungs. Normal heart size and pulmonary vascularity. No blunting of costophrenic angles. No pneumothorax. Postoperative changes of left mastectomy and surgical clips in the left axilla.  Lumbar scoliosis convex to the right with degenerative changes in the spine. Gas and stool throughout the colon with scattered gas in the small bowel. No small or large bowel distention. Findings likely represent normal variation or ileus. No free intra-abdominal air. No abnormal air-fluid levels. Vascular calcifications. No radiopaque stones.  IMPRESSION: Large masslike opacity in the right mid lung. CT recommended for further evaluation. Emphysematous changes in the lungs.  Scattered gas and stool within nondistended small and large bowel. No evidence of obstruction. Lumbar scoliosis and degenerative changes.   Electronically Signed   By: Lucienne Capers M.D.   On: 05/20/2014 19:00    EKG: Independently reviewed. sinus rhythm   Assessment/Plan Active Problems:   Necrotic pneumonia   Pneumonia  Necrotizing pneumonia CT chest showed pneumonia with necrosis in the right  lower lobe area did also possibility of mass with neo plastic process. We'll start the patient on vancomycin and cefepime for the pneumonia. Patient might require biopsy of the mass if pneumonia is unresolving. Will check strep pneumo antigen as well as Legionella antigen. Follow the blood culture results.  Atrial fibrillation Will continue with amiodarone, cut down the dose of metoprolol to 50 g twice a day as patient had low blood pressure. Continue Coumadin per pharmacy consultation  History of hypertension Patient blood pressure was lower side when she came to the ED with systolic in 264B. Will hold amlodipine cut down the dose of metoprolol to 50 mg twice a day.  Hyperlipidemia Continue WelChol, Pravachol  History of carotid artery disease  Continue aspirin, Plavix .  Anemia Hemoglobin is 8.5, her last hemoglobin from 09/05/2013 was 12.8. We'll check stool for occult blood as patient is on aspirin and Plavix and Coumadin.  Code status: full code  Family discussion: Admission, patients condition and plan of care including tests being ordered have been discussed with the patient and her husband at bedside** who indicate understanding and agree with the plan and Code Status.   Time Spent on Admission: *60 minutes   [facility] Pager: [phone] 05/20/2014, 9:45 PM  If 7PM-7AM, please contact night-coverage  [email or web address]  Password [email or web address]

## 2014-05-20 NOTE — ED Notes (Signed)
Pt IV in left arm occluded, per Dr. Leonides Schanz and Dr. Darrick Meigs, okay to place IV in right arm, IV placed in inner right forearm.

## 2014-05-20 NOTE — ED Notes (Signed)
Assisted Malen Gauze with an in and out on patient.

## 2014-05-20 NOTE — ED Provider Notes (Signed)
TIME SEEN: 5:40 PM  CHIEF COMPLAINT: Generalized weakness, decreased appetite and oral intake, abdominal pain, back pain  HPI: Pt is a 74 y.o. female with history of hypertension, hyperlipidemia, atrial fibrillation on Coumadin, prior stroke, prior breast cancer in remission since 1989 who presents emergency department mostly for generalized weakness. Patient has no reports that she has not been eating or drinking well for the past several days and has lost 15 pounds over the past several weeks. She has a history of chronic back pain from scoliosis, osteoarthritis, degenerative disc disease and recently had a T12 compression fracture and is receiving rehabilitation. States that she takes oxycodone at home for her back and states that her back pain is been slightly worse over the past 3 days but no new injury. No numbness, tingling or focal weakness. No bowel or bladder incontinence. Denies any fever but has had a cough. No chest pain or shortness of breath. No vomiting or diarrhea. Last bowel movement was yesterday and there was no blood or melena. States she is having abdominal pain in her epigastric region and she describes this as feeling "raw". Denies dysuria or hematuria.  ROS: See HPI Constitutional: no fever  Eyes: no drainage  ENT: no runny nose   Cardiovascular:  no chest pain  Resp: no SOB  GI: no vomiting GU: no dysuria Integumentary: no rash  Allergy: no hives  Musculoskeletal: no leg swelling  Neurological: no slurred speech ROS otherwise negative  PAST MEDICAL HISTORY/PAST SURGICAL HISTORY:  Past Medical History  Diagnosis Date  . Subclavian steal syndrome   . Carotid bruit     LICA 19-37% (duplex 9/02)  . Hypertension   . Hyperlipidemia   . Atrial fibrillation     x 3 yrs  . Anticoagulation management encounter   . Meningioma   . Osteoporosis   . Stroke     CVA 2008  . Pneumonia   . Breast cancer     S/P left mastectomy, 1989 remained in remission  . Anemia      MEDICATIONS:  Prior to Admission medications   Medication Sig Start Date End Date Taking? Authorizing Provider  acetaminophen (TYLENOL) 500 MG tablet Take 500 mg by mouth every 6 (six) hours as needed for pain.    Historical Provider, MD  alendronate (FOSAMAX) 70 MG tablet Take 70 mg by mouth every 7 (seven) days. Take in the morning with a full glass of water, on an empty stomach, and do not take anything else by mouth or lie down for the next 30 min. Patient take on Sunday    Historical Provider, MD  amiodarone (PACERONE) 200 MG tablet Take 1 tablet by mouth  daily 12/22/13   Josue Hector, MD  amLODipine (NORVASC) 10 MG tablet Take 10 mg by mouth daily.    Historical Provider, MD  aspirin EC 81 MG tablet Take 81 mg by mouth daily.      Historical Provider, MD  calcium-vitamin D (OSCAL) 250-125 MG-UNIT per tablet Take 1 tablet by mouth 2 (two) times daily.      Historical Provider, MD  clopidogrel (PLAVIX) 75 MG tablet Take 1 tablet by mouth  every morning 03/15/14   Josue Hector, MD  cyclobenzaprine (FLEXERIL) 10 MG tablet Take 10 mg by mouth 3 (three) times daily as needed for muscle spasms.    Historical Provider, MD  HYDROcodone-acetaminophen (NORCO) 5-325 MG per tablet Take 1 tablet by mouth every 6 (six) hours as needed for moderate pain. pain  Historical Provider, MD  HYDROcodone-acetaminophen (NORCO) 7.5-325 MG per tablet as needed. 12/08/13   Historical Provider, MD  irbesartan (AVAPRO) 300 MG tablet Take 300 mg by mouth daily.    Historical Provider, MD  Iron-FA-B Cmp-C-Biot-Probiotic (FUSION PLUS) CAPS Take 1 capsule by mouth daily.    Historical Provider, MD  magnesium oxide (MAG-OX) 400 MG tablet Take 400 mg by mouth daily.    Historical Provider, MD  meloxicam (MOBIC) 15 MG tablet Take 15 mg by mouth every morning.      Historical Provider, MD  metoprolol tartrate (LOPRESSOR) 25 MG tablet Take 3 tablets (75 mg  total) by mouth 2 (two)  times daily. 02/03/14   Josue Hector,  MD  Multiple Vitamin (MULTIVITAMIN) capsule Take 1 capsule by mouth daily.      Historical Provider, MD  Omega-3 Fatty Acids (FISH OIL) 1200 MG CAPS Take 1,200 mg by mouth 2 (two) times daily.    Historical Provider, MD  pravastatin (PRAVACHOL) 20 MG tablet Take 20 mg by mouth daily.    Historical Provider, MD  pravastatin (PRAVACHOL) 40 MG tablet Take 1 tablet by mouth  every night at bedtime 03/08/14   Josue Hector, MD  vitamin C (ASCORBIC ACID) 500 MG tablet Take 500 mg by mouth daily.    Historical Provider, MD  warfarin (COUMADIN) 1 MG tablet Take 1 tablet (1 mg total) by mouth one time only at 6 PM. 04/06/14   Josue Hector, MD  [facility] 625 MG tablet Take 3 tablets by mouth  twice a day with a meal 11/13/13   Josue Hector, MD    ALLERGIES:  Allergies  Allergen Reactions  . Iodine Rash and Other (See Comments)    BETADINE Rash/burning, blisters on skin.  . Iohexol Rash and Other (See Comments)    Blisters; PT NEEDS 13-HOUR PREP   . Tape Itching and Rash    Prefers PAPER TAPE    SOCIAL HISTORY:  History  Substance Use Topics  . Smoking status: Former Smoker -- 1.00 packs/day for 56 years    Types: Cigarettes    Quit date: 02/14/2007  . Smokeless tobacco: Never Used     Comment: x 4 yrs.  . Alcohol Use: No    FAMILY HISTORY: Family History  Problem Relation Age of Onset  . Alzheimer's disease Mother   . Dementia Mother   . Hypertension Mother   . Parkinsonism Father     EXAM: BP 115/62 mmHg  Pulse 104  Temp(Src) 98.1 F (36.7 C) (Oral)  Resp 20  Ht 5\' 5"  (1.651 m)  Wt 100 lb (45.36 kg)  BMI 16.64 kg/m2  SpO2 97% CONSTITUTIONAL: Alert and oriented and responds appropriately to questions. Thin, elderly, in no acute distress HEAD: Normocephalic EYES: Conjunctivae clear, PERRL ENT: normal nose; no rhinorrhea; moist mucous membranes; pharynx without lesions noted NECK: Supple, no meningismus, no LAD  CARD: RRR; S1 and S2 appreciated; no murmurs, no clicks,  no rubs, no gallops RESP: Normal chest excursion without splinting or tachypnea; breath sounds clear and equal bilaterally; no wheezes, no rhonchi, no rales, no hypoxia or respiratory distress ABD/GI: Normal bowel sounds; non-distended; soft, non-tender, no rebound, no guarding, no peritoneal signs BACK:  The back appears normal and is non-tender to palpation, there is no CVA tenderness, scoliosis with no midline spinal tenderness or step-off or deformity EXT: Normal ROM in all joints; non-tender to palpation; no edema; normal capillary refill; no cyanosis    SKIN: Normal color  for age and race; warm NEURO: Moves all extremities equally, able to lift her legs off the bed to almost 90 flexion of the hips, no pronator drift, sensation to light touch intact diffusely, no clonus PSYCH: The patient's mood and manner are appropriate. Grooming and personal hygiene are appropriate.  MEDICAL DECISION MAKING: Patient here with generalized weakness, deconditioning. Suspect some of her symptoms are due to her chronic pain and being on narcotics. We'll check basic labs, troponin and EKG, chest x-ray and urine to evaluate for infection. We'll give IV fluids. We'll give fentanyl for her chronic back pain.  ED PROGRESS: Patient is still hemodynamically stable but has a leukocytosis of 30,000 with left shift. Chest x-ray does show a right-sided opacity that could be a mass that may also be infectious. Will obtain CT scan for further evaluation. She is also mildly hyponatremic and is receiving IV fluids that she does appear dry on exam. Troponin negative. INR therapeutic. No free air seen on abdominal x-ray or obstruction or constipation. Will obtain lactate, blood cultures, rectal temp. Hemoglobin is also 8.5 which is lower than her baseline. We'll obtain Hemoccult.    8:00 PM  Pt's rectal temp is 100.3. We'll give Tylenol. She denies any known fever at home. States her back pain is her chronic pain since her T12  fracture. No new neurologic deficits. She does have a cough and her CT scan is pending to evaluate for possible pneumonia.  Hemoccult is negative.   8:30 PM  Pt's CT scan shows possible necrotic pneumonia versus mass. Suspect pneumonia given her cough, fever, leukocytosis. No recent admission to the hospital. Given her significant leukocytosis and large pneumonia will give broad-spectrum antibiotics. Lactate normal. We'll discuss with hospitalist for admission.   8:45 PM  D/w Dr. Darrick Meigs for admission to medical bed, inpt.     Date: 05/20/2014 18:11  Rate: 86  Rhythm: normal sinus rhythm  QRS Axis: normal  Intervals: First-degree heart block, prolonged QT interval  ST/T Wave abnormalities: normal  Conduction Disutrbances: none  Narrative Interpretation: First-degree heart block, prolonged QT interval, no significant change compared to prior EKGs      [place], DO 05/20/14 2045

## 2014-05-21 ENCOUNTER — Ambulatory Visit (HOSPITAL_COMMUNITY): Payer: Medicare Other

## 2014-05-21 DIAGNOSIS — D473 Essential (hemorrhagic) thrombocythemia: Secondary | ICD-10-CM | POA: Diagnosis present

## 2014-05-21 DIAGNOSIS — D75839 Thrombocytosis, unspecified: Secondary | ICD-10-CM | POA: Diagnosis present

## 2014-05-21 DIAGNOSIS — R531 Weakness: Secondary | ICD-10-CM | POA: Insufficient documentation

## 2014-05-21 DIAGNOSIS — E440 Moderate protein-calorie malnutrition: Secondary | ICD-10-CM | POA: Insufficient documentation

## 2014-05-21 DIAGNOSIS — E43 Unspecified severe protein-calorie malnutrition: Secondary | ICD-10-CM

## 2014-05-21 DIAGNOSIS — D649 Anemia, unspecified: Secondary | ICD-10-CM | POA: Diagnosis present

## 2014-05-21 LAB — COMPREHENSIVE METABOLIC PANEL
ALBUMIN: 1.9 g/dL — AB (ref 3.5–5.2)
ALT: 24 U/L (ref 0–35)
AST: 23 U/L (ref 0–37)
Alkaline Phosphatase: 174 U/L — ABNORMAL HIGH (ref 39–117)
Anion gap: 10 (ref 5–15)
BUN: 11 mg/dL (ref 6–23)
CHLORIDE: 103 mmol/L (ref 96–112)
CO2: 23 mmol/L (ref 19–32)
CREATININE: 0.56 mg/dL (ref 0.50–1.10)
Calcium: 8.2 mg/dL — ABNORMAL LOW (ref 8.4–10.5)
GFR calc non Af Amer: 89 mL/min — ABNORMAL LOW (ref >90)
Glucose, Bld: 94 mg/dL (ref 70–99)
POTASSIUM: 3.8 mmol/L (ref 3.5–5.1)
SODIUM: 136 mmol/L (ref 135–145)
Total Bilirubin: 0.5 mg/dL (ref 0.3–1.2)
Total Protein: 6.6 g/dL (ref 6.0–8.3)

## 2014-05-21 LAB — CBC
HCT: 24.4 % — ABNORMAL LOW (ref 36.0–46.0)
Hemoglobin: 7.6 g/dL — ABNORMAL LOW (ref 12.0–15.0)
MCH: 27.8 pg (ref 26.0–34.0)
MCHC: 31.1 g/dL (ref 30.0–36.0)
MCV: 89.4 fL (ref 78.0–100.0)
Platelets: 827 10*3/uL — ABNORMAL HIGH (ref 150–400)
RBC: 2.73 MIL/uL — AB (ref 3.87–5.11)
RDW: 15.3 % (ref 11.5–15.5)
WBC: 31 10*3/uL — ABNORMAL HIGH (ref 4.0–10.5)

## 2014-05-21 LAB — TSH: TSH: 1.703 u[IU]/mL (ref 0.350–4.500)

## 2014-05-21 LAB — VITAMIN B12: Vitamin B-12: 1003 pg/mL — ABNORMAL HIGH (ref 211–911)

## 2014-05-21 LAB — STREP PNEUMONIAE URINARY ANTIGEN: Strep Pneumo Urinary Antigen: NEGATIVE

## 2014-05-21 LAB — PROTIME-INR
INR: 2.54 — AB (ref 0.00–1.49)
Prothrombin Time: 27.6 seconds — ABNORMAL HIGH (ref 11.6–15.2)

## 2014-05-21 LAB — IRON AND TIBC
Iron: <10 ug/dL — ABNORMAL LOW (ref 42–145)
UIBC: 149 ug/dL (ref 125–400)

## 2014-05-21 LAB — RETICULOCYTES
RBC.: 2.5 MIL/uL — AB (ref 3.87–5.11)
RETIC CT PCT: 3.2 % — AB (ref 0.4–3.1)
Retic Count, Absolute: 80 10*3/uL (ref 19.0–186.0)

## 2014-05-21 LAB — FOLATE: Folate: >20 ng/mL

## 2014-05-21 LAB — FERRITIN: FERRITIN: 658 ng/mL — AB (ref 10–291)

## 2014-05-21 MED ORDER — CETYLPYRIDINIUM CHLORIDE 0.05 % MT LIQD
7.0000 mL | Freq: Two times a day (BID) | OROMUCOSAL | Status: DC
  Administered 2014-05-21 – 2014-05-24 (×7): 7 mL via OROMUCOSAL

## 2014-05-21 MED ORDER — WARFARIN - PHARMACIST DOSING INPATIENT: Status: DC

## 2014-05-21 MED ORDER — DEXTROSE 5 % IV SOLN
1.0000 g | INTRAVENOUS | Status: DC
  Administered 2014-05-21 – 2014-05-23 (×3): 1 g via INTRAVENOUS
  Filled 2014-05-21 (×4): qty 1

## 2014-05-21 MED ORDER — VANCOMYCIN HCL IN DEXTROSE 1-5 GM/200ML-% IV SOLN
1000.0000 mg | INTRAVENOUS | Status: DC
  Administered 2014-05-21 – 2014-05-23 (×3): 1000 mg via INTRAVENOUS
  Filled 2014-05-21 (×3): qty 200

## 2014-05-21 MED ORDER — WARFARIN SODIUM 2 MG PO TABS
2.0000 mg | ORAL_TABLET | Freq: Once | ORAL | Status: AC
  Administered 2014-05-21: 2 mg via ORAL
  Filled 2014-05-21: qty 1

## 2014-05-21 NOTE — Progress Notes (Addendum)
ANTICOAGULATION CONSULT NOTE - Initial Consult  Pharmacy Consult for Coumadin (chronic Rx PTA) Indication: atrial fibrillation  Allergies  Allergen Reactions  . Iodine Rash and Other (See Comments)    BETADINE Rash/burning, blisters on skin.  . Iohexol Rash and Other (See Comments)    Blisters; PT NEEDS 13-HOUR PREP   . Tape Itching and Rash    Prefers PAPER TAPE   Patient Measurements: Height: 5\' 5"  (165.1 cm) Weight: 100 lb 15.5 oz (45.8 kg) IBW/kg (Calculated) : 57  Vital Signs: Temp: 97.7 F (36.5 C) (02/12 0440) Temp Source: Oral (02/12 0440) BP: 120/81 mmHg (02/12 0440) Pulse Rate: 79 (02/12 0440)  Labs:  Recent Labs  05/20/14 1803 05/21/14 0542 05/21/14 0804  HGB 8.5* 7.6*  --   HCT 26.2* 24.4*  --   PLT SPECIMEN CHECKED FOR CLOTS 827*  --   LABPROT 26.8*  --  27.6*  INR 2.45*  --  2.54*  CREATININE 0.66 0.56  --   TROPONINI <0.03  --   --    Estimated Creatinine Clearance: 43.9 mL/min (by C-G formula based on Cr of 0.56).  Medical History: Past Medical History  Diagnosis Date  . Subclavian steal syndrome   . Carotid bruit     LICA 93-26% (duplex 7/12)  . Hypertension   . Hyperlipidemia   . Atrial fibrillation     x 3 yrs  . Anticoagulation management encounter   . Meningioma   . Osteoporosis   . Stroke     CVA 2008  . Pneumonia   . Breast cancer     S/P left mastectomy, 1989 remained in remission  . Anemia    Medications:  Prescriptions prior to admission  Medication Sig Dispense Refill Last Dose  . acetaminophen (TYLENOL) 500 MG tablet Take 1,000 mg by mouth every 6 (six) hours as needed for mild pain or moderate pain.    05/20/2014 at Unknown time  . alendronate (FOSAMAX) 70 MG tablet Take 70 mg by mouth every Sunday. Take in the morning with a full glass of water, on an empty stomach, and do not take anything else by mouth or lie down for the next 30 min. Patient take on Sunday   Past Month at Unknown time  . amiodarone (PACERONE) 200 MG  tablet Take 1 tablet by mouth  daily (Patient taking differently: Take 1 tablet by mouth  daily in the morning) 30 tablet 6 05/20/2014 at Unknown time  . amLODipine (NORVASC) 10 MG tablet Take 10 mg by mouth every evening.    05/19/2014 at Unknown time  . aspirin EC 81 MG tablet Take 81 mg by mouth every morning.    05/20/2014 at Unknown time  . calcium-vitamin D (OSCAL) 250-125 MG-UNIT per tablet Take 1 tablet by mouth 2 (two) times daily.     05/20/2014 at Unknown time  . clopidogrel (PLAVIX) 75 MG tablet Take 1 tablet by mouth  every morning 90 tablet 0 05/20/2014 at Unknown time  . irbesartan (AVAPRO) 300 MG tablet Take 300 mg by mouth every evening.    05/19/2014 at Unknown time  . Iron-FA-B Cmp-C-Biot-Probiotic (FUSION PLUS) CAPS Take 1 capsule by mouth every morning.    05/20/2014 at Unknown time  . magnesium oxide (MAG-OX) 400 MG tablet Take 400 mg by mouth every morning.    05/20/2014 at Unknown time  . meloxicam (MOBIC) 15 MG tablet Take 15 mg by mouth every morning.     05/20/2014 at Unknown time  . metoprolol  tartrate (LOPRESSOR) 25 MG tablet Take 3 tablets (75 mg  total) by mouth 2 (two)  times daily. 540 tablet 1 05/20/2014 at 730a  . Multiple Vitamin (MULTIVITAMIN WITH MINERALS) TABS tablet Take 1 tablet by mouth every morning.   05/20/2014 at Unknown time  . Omega-3 Fatty Acids (FISH OIL) 1200 MG CAPS Take 1,200 mg by mouth 2 (two) times daily.   05/20/2014 at Unknown time  . oxyCODONE-acetaminophen (PERCOCET) 7.5-325 MG per tablet Take 0.5 tablets by mouth at bedtime as needed. For pain   05/19/2014 at Unknown time  . pravastatin (PRAVACHOL) 40 MG tablet Take 1 tablet by mouth  every night at bedtime 90 tablet 1 05/19/2014 at Unknown time  . vitamin C (ASCORBIC ACID) 500 MG tablet Take 500 mg by mouth every morning.    05/20/2014 at Unknown time  . warfarin (COUMADIN) 1 MG tablet Take 1 tablet (1 mg total) by mouth one time only at 6 PM. (Patient taking differently: Take 2-4 mg by mouth every  evening. 2mg  on all days except Mondays. Take 4mg  on Mondays) 90 tablet 1 05/19/2014 at Unknown time  . WELCHOL 625 MG tablet Take 3 tablets by mouth  twice a day with a meal 180 tablet 3 05/20/2014 at Unknown time   Assessment: 74yo female on chronic Coumadin PTA.  Home dose listed above.  INR is therapeutic therefore Lovenox d/c'd.    Goal of Therapy:  INR 2-3 Monitor platelets by anticoagulation protocol: Yes   Plan:  Coumadin 2mg  today x 1 (home dose) INR daily  Hart Robinsons A 05/21/2014,9:36 AM

## 2014-05-21 NOTE — Progress Notes (Signed)
Assisted to bathroom with patient pushing wheelchair to help steady herself.  Voided clear yellow urine, specimen sent to lab per orders.

## 2014-05-21 NOTE — Progress Notes (Signed)
INITIAL NUTRITION ASSESSMENT Pt meets criteria for SEVERE MALNUTRITION in the context of Acute illness as evidenced by consuming <50% energy intake compared to needs for >5 days and loss of  13% of bw in 1 month. DOCUMENTATION CODES Per approved criteria  -Severe malnutrition in the context of acute illness or injury   INTERVENTION: 1. Ensure Complete po BID, each supplement provides 350 kcal and 13 grams of protein  2. Encourage PO intake  NUTRITION DIAGNOSIS: Inadequate Oral intake related to back pain and lock of appetite as evidenced by reported loss of 15 pounds in 1 month   Goal: Pt to meet >/= 90% of their estimated nutrition needs   Monitor:  Oral intake, gi distress/symptoms, weight, lab/procedures  Reason for Assessment: Poor PO intake  74 y.o. female  Admitting Dx: <principal problem not specified>  ASSESSMENT: 74 year old female w/ pmhx: Subclavian steal syndrome; Carotid bruit; Hypertension; Hyperlipidemia; Atrial fibrillation;  Meningioma; Osteoporosis; Stroke; Pneumonia; Breast cancer; and Anemia.Today presented to the ED with chief complaint of generalized weakness, poor by mouth intake and 15 pounds weight loss over past several weeks  Patient sais she was maintaining her weight and eating well up until one month ago. She lost her appetite, became extremely fatigues and also had issues with back pain that severely reduced her intake. She did say she would drink 1 boost daily that had 220 kcals in it, but had little else. She denies n/v or c/d.   Patient agreeable to trying Ensure BID  Nutrition Focused Physical Exam:  Subcutaneous Fat:  Orbital Region: mild Upper Arm Region: mild Thoracic and Lumbar Region: mild  Muscle:  Temple Region: mild Clavicle Bone Region:moderate Clavicle and Acromion Bone Region: mild Scapular Bone Region: n/a Dorsal Hand: none Patellar Region: n/a Anterior Thigh Region:mild Posterior Calf Region: mild  Edema:  None  Height: Ht Readings from Last 1 Encounters:  05/20/14 5\' 5"  (1.651 m)    Weight: Wt Readings from Last 1 Encounters:  05/20/14 100 lb 15.5 oz (45.8 kg)    Ideal Body Weight: 125  % Ideal Body Weight: 80%  Wt Readings from Last 10 Encounters:  05/20/14 100 lb 15.5 oz (45.8 kg)  02/15/14 115 lb (52.164 kg)  12/28/13 120 lb (54.432 kg)  12/16/13 123 lb (55.792 kg)  09/05/13 115 lb (52.164 kg)  08/10/13 115 lb 6.4 oz (52.345 kg)  06/29/13 115 lb (52.164 kg)  06/24/13 117 lb 6.4 oz (53.252 kg)  01/12/13 116 lb (52.617 kg)  12/22/12 118 lb (53.524 kg)    Usual Body Weight: 115  % Usual Body Weight: 87%  BMI:  Body mass index is 16.8 kg/(m^2).  Estimated Nutritional Needs: Kcal: 1600-1850 (35-40 kcal/kg) Protein: >70 g Fluid: 1.6-1.85 liters  Skin: Dry  Diet Order: Diet Heart  EDUCATION NEEDS: -No education needs identified at this time   Intake/Output Summary (Last 24 hours) at 05/21/14 0902 Last data filed at 05/21/14 0700  Gross per 24 hour  Intake    795 ml  Output    100 ml  Net    695 ml    Last BM: 2/11   Labs:   Recent Labs Lab 05/20/14 1803 05/21/14 0542  NA 129* 136  K 4.2 3.8  CL 100 103  CO2 22 23  BUN 14 11  CREATININE 0.66 0.56  CALCIUM 8.1* 8.2*  GLUCOSE 103* 94    CBG (last 3)  No results for input(s): GLUCAP in the last 72 hours.  Scheduled Meds: . amiodarone  200 mg Oral Daily  . antiseptic oral rinse  7 mL Mouth Rinse BID  . aspirin EC  81 mg Oral q morning - 10a  . ceFEPime (MAXIPIME) IV  1 g Intravenous Q24H  . clopidogrel  75 mg Oral Daily  . colesevelam  1,875 mg Oral BID WC  . enoxaparin (LOVENOX) injection  40 mg Subcutaneous Q24H  . metoprolol tartrate  50 mg Oral BID  . pravastatin  40 mg Oral Daily  . sodium chloride  3 mL Intravenous Q12H  . vancomycin  1,000 mg Intravenous Q24H    Continuous Infusions: . sodium chloride 100 mL/hr at 05/20/14 2303    Past Medical History  Diagnosis Date  .  Subclavian steal syndrome   . Carotid bruit     LICA 48-18% (duplex 5/90)  . Hypertension   . Hyperlipidemia   . Atrial fibrillation     x 3 yrs  . Anticoagulation management encounter   . Meningioma   . Osteoporosis   . Stroke     CVA 2008  . Pneumonia   . Breast cancer     S/P left mastectomy, 1989 remained in remission  . Anemia     Past Surgical History  Procedure Laterality Date  . Cardiac catheterization    . Mastectomy Left 1990  . Cataract extraction Bilateral 2013  . Colonoscopy  11/17/2010    Procedure: COLONOSCOPY;  Surgeon: [name], MD;  Location: [facility];  Service: Endoscopy;  Laterality: N/A;  10:45 am  . Esophagogastroduodenoscopy  11/17/2010    Procedure: ESOPHAGOGASTRODUODENOSCOPY (EGD);  Surgeon: [name], MD;  Location: [facility];  Service: Endoscopy;  Laterality: N/A;  . Lumbar epidural injection  06-2012--06-2013    pt. states she has had 5 epidurals in 06-2012----06-2013  . Eye surgery      Burtis Junes RD, LDN Nutrition Pager: [phone] 05/21/2014 9:02 AM

## 2014-05-21 NOTE — Care Management Utilization Note (Signed)
UR completed 

## 2014-05-21 NOTE — Progress Notes (Signed)
[facility] PROGRESS NOTE  [name] KZS:[id number] DOB: 1940/10/08 DOA: 05/20/2014 PCP: Delphina Cahill, MD  Assessment/Plan: Necrotizing pneumonia WBC's trending up. She is afebrile and non-toxic appearing. Oxygen saturation level >90% on room air  Continue vancomycin and cefepime day #2. Strep pneumo antigen as well as Legionella antigen are pending. Blood culture with no growth. CT with concern for neoplastic disease as well. May need biopsy if no improvement  Anemia Hg dropped from 8.5 to 7.6. May be dilutional. Chart review indicates Hg 12 6 months ago. Will check FOBT. Will check anemia panel. She is on asa, plavix and coumadin.  Monitor. Transfuse as indicated. VSS  Thrombocytosis Platelet 827. Chart review indicates platelets 305 6 months ago. May be reactive. Will monitor. If no improvement will work up.   Atrial fibrillation  Rate controlled. INR therapeutic. Continue amiodarone and BB at lower dose.  History of hypertension BP soft in ED. SBP 110-130. Will continue to hold amlodipine and irbesartan. Will monitor and resume as indicated. Continue metoprolol at lower dose.   Hyperlipidemia Continue WelChol, Pravachol  History of carotid artery disease  Continue aspirin, Plavix .  Chronic low back pain S/ P several surgeries and injections. On chronic pain meds. Has been wheelchair bound "for 3 months". Currently in OP rehab.     Code Status: full Family Communication: none Disposition Plan: home when ready   Consultants:  none  Procedures:  none  Antibiotics:  Vancomycin 05/20/14>>  Cefepime 05/20/14>>  HPI/Subjective: Awake alert reports "feeling better now"   Objective: Filed Vitals:   05/21/14 0440  BP: 120/81  Pulse: 79  Temp: 97.7 F (36.5 C)  Resp: 18    Intake/Output Summary (Last 24 hours) at 05/21/14 0931 Last data filed at 05/21/14 0700  Gross per 24 hour  Intake    795 ml  Output    100 ml  Net    695 ml   Filed Weights    05/20/14 1727 05/20/14 2247  Weight: 45.36 kg (100 lb) 45.8 kg (100 lb 15.5 oz)    Exam:   General:  Thin frail appears chronically ill  Cardiovascular: RRR no MGR no LE edema  Respiratory: normal effort BS diminished. Slightly coarse. No wheeze moist coughing during exam  Abdomen: soft non-distended +BS non tender to palpation  Musculoskeletal: no clubbing or cyanosis   Data Reviewed: Basic Metabolic Panel:  Recent Labs Lab 05/20/14 1803 05/21/14 0542  NA 129* 136  K 4.2 3.8  CL 100 103  CO2 22 23  GLUCOSE 103* 94  BUN 14 11  CREATININE 0.66 0.56  CALCIUM 8.1* 8.2*   Liver Function Tests:  Recent Labs Lab 05/20/14 1803 05/21/14 0542  AST 22 23  ALT 28 24  ALKPHOS 203* 174*  BILITOT 0.4 0.5  PROT 7.3 6.6  ALBUMIN 2.1* 1.9*    Recent Labs Lab 05/20/14 1803  LIPASE 31   No results for input(s): AMMONIA in the last 168 hours. CBC:  Recent Labs Lab 05/20/14 1803 05/21/14 0542  WBC 29.6* 31.0*  NEUTROABS 25.1*  --   HGB 8.5* 7.6*  HCT 26.2* 24.4*  MCV 87.9 89.4  PLT SPECIMEN CHECKED FOR CLOTS 827*   Cardiac Enzymes:  Recent Labs Lab 05/20/14 1803  TROPONINI <0.03   BNP (last 3 results) No results for input(s): BNP in the last 8760 hours.  ProBNP (last 3 results) No results for input(s): PROBNP in the last 8760 hours.  CBG: No results for input(s): GLUCAP in the  last 168 hours.  Recent Results (from the past 240 hour(s))  Blood culture (routine x 2)     Status: None (Preliminary result)   Collection Time: 05/20/14  8:05 PM  Result Value Ref Range Status   Specimen Description BLOOD RIGHT ARM  Final   Special Requests BOTTLES DRAWN AEROBIC AND ANAEROBIC 8CC EACH  Final   Culture NO GROWTH 1 DAY  Final   Report Status PENDING  Incomplete  Blood culture (routine x 2)     Status: None (Preliminary result)   Collection Time: 05/20/14  8:10 PM  Result Value Ref Range Status   Specimen Description BLOOD RIGHT HAND  Final   Special  Requests BOTTLES DRAWN AEROBIC AND ANAEROBIC 6cc each  Final   Culture NO GROWTH 1 DAY  Final   Report Status PENDING  Incomplete     Studies: Ct Chest Wo Contrast  05/20/2014   CLINICAL DATA:  Abnormal chest radiograph tonight. Consolidation noted in the right lower lung on acute abdominal series. Patient has leukocytosis, weakness and cough for 1 week.  EXAM: CT CHEST WITHOUT CONTRAST  TECHNIQUE: Multidetector CT imaging of the chest was performed following the standard protocol without IV contrast.  COMPARISON:  Current chest radiograph.  FINDINGS: There is a heterogeneous masslike area of consolidation in the posterior right lower lobe. This contains mostly fluid attenuation with intervening areas of higher attenuation. It is surrounded by patchy airspace and coarse reticular type opacities. Masslike area measures 9.4 cm x 7 cm x 9.3 cm. It has mass effect bulging the right oblique fissure anteriorly and compressing adjacent lung. There are air bubbles within this.  There are few areas of mild reticular opacity in the posterior right upper lobe likely scarring and/ or atelectasis. A 4.4 mm nodule lies in the left lower lobe, image 42, series 3. Pleural-parenchymal scarring is noted at the apices, most prominently on the right. Mild changes of centrilobular emphysema are noted in the upper lobes.  There is mild right hilar and mediastinal adenopathy. A precarinal node measures 1 cm short axis.  Heart is normal in size and configuration. There are moderate coronary artery calcifications. Great vessels are normal in caliber.  There is a small hiatal hernia. Limited evaluation of the upper abdomen demonstrates aortic and branch vessel atherosclerotic calcifications but is otherwise unremarkable. No adrenal masses.  Marked compression fracture of T12. Large Schmorl's node noted in the upper endplate of L2. No osteoblastic or osteolytic lesions.  IMPRESSION: 1. Masslike area of consolidation in the right lower  lobe. This may reflect pneumonia with necrosis. Neoplastic disease is possible. Coarse reticular and patchy airspace lung opacity in the adjacent along is likely combination of infection and compressive atelectasis. 2. Mild right hilar and mediastinal adenopathy. 3. Underlying COPD. Given the patient's symptoms of cough and elevated white blood cell count, necrotic pneumonia is felt to be the most likely etiology. However, if the patient does not respond to treatment for pneumonia, biopsy would be indicated.   Electronically Signed   By: Lajean Manes M.D.   On: 05/20/2014 20:11   Dg Abd Acute W/chest  05/20/2014   CLINICAL DATA:  Generalized abdominal pain for 1 week. Chronic back pain worse for 3 days. Chronic compression fractures. History of breast cancer post mastectomy 1989.  EXAM: ACUTE ABDOMEN SERIES (ABDOMEN 2 VIEW & CHEST 1 VIEW)  COMPARISON:  Chest 01/13/2013 and 07/28/2010.  FINDINGS: There is a large masslike opacity in the right mid lung. CT is recommended  to exclude neoplasm. Localized pneumonia is also within the differential diagnosis. Diffuse emphysematous changes in the lungs. Normal heart size and pulmonary vascularity. No blunting of costophrenic angles. No pneumothorax. Postoperative changes of left mastectomy and surgical clips in the left axilla.  Lumbar scoliosis convex to the right with degenerative changes in the spine. Gas and stool throughout the colon with scattered gas in the small bowel. No small or large bowel distention. Findings likely represent normal variation or ileus. No free intra-abdominal air. No abnormal air-fluid levels. Vascular calcifications. No radiopaque stones.  IMPRESSION: Large masslike opacity in the right mid lung. CT recommended for further evaluation. Emphysematous changes in the lungs.  Scattered gas and stool within nondistended small and large bowel. No evidence of obstruction. Lumbar scoliosis and degenerative changes.   Electronically Signed   By:  Lucienne Capers M.D.   On: 05/20/2014 19:00    Scheduled Meds: . amiodarone  200 mg Oral Daily  . antiseptic oral rinse  7 mL Mouth Rinse BID  . aspirin EC  81 mg Oral q morning - 10a  . ceFEPime (MAXIPIME) IV  1 g Intravenous Q24H  . clopidogrel  75 mg Oral Daily  . colesevelam  1,875 mg Oral BID WC  . enoxaparin (LOVENOX) injection  40 mg Subcutaneous Q24H  . metoprolol tartrate  50 mg Oral BID  . pravastatin  40 mg Oral Daily  . sodium chloride  3 mL Intravenous Q12H  . vancomycin  1,000 mg Intravenous Q24H   Continuous Infusions: . sodium chloride 100 mL/hr at 05/20/14 2303    Active Problems:   Essential hypertension   Atrial fibrillation   Necrotic pneumonia   Pneumonia   Anemia   Thrombocytosis    Time spent: 40 minutes    [facility] Pager [phone]. If 7PM-7AM, please contact night-coverage at [email or web address], password [facility] 05/21/2014, 9:31 AM  LOS: 1 day

## 2014-05-21 NOTE — Plan of Care (Signed)
Problem: Phase I Progression Outcomes Goal: Pain controlled with appropriate interventions Outcome: Progressing Medicated as needed for pain control

## 2014-05-21 NOTE — Care Management Note (Addendum)
    Page 1 of 1   05/24/2014     3:40:44 PM CARE MANAGEMENT NOTE 05/24/2014  Patient:  [name], [name]   Account Number:  [id number]  Date Initiated:  05/21/2014  Documentation initiated by:  Jolene Provost  Subjective/Objective Assessment:   Pt admitted for necrotizing pneumonia. Pt is from home, lives with husband. Pt pushes a wheelchair for stability, she does not want to use a walker because she can put things in the seat of the walker.     Action/Plan:   Pt goes to OP [facility]hab at the [facility]. Discussed the possibility of needing IV abx at discharge. Pt says she would like to go home with IV abx if needed but would have to talk to her husband about help administering IV meds.   Anticipated DC Date:  05/25/2014   Anticipated DC Plan:  [place] referral  Clinical Social Worker      DC Planning Services  CM consult      Choice offered to / List presented to:             Status of service:  Completed, signed off Medicare Important Message given?  YES (If response is "NO", the following Medicare IM given date fields will be blank) Date Medicare IM given:  05/21/2014 Medicare IM given by:  Jolene Provost Date Additional Medicare IM given:  05/24/2014 Additional Medicare IM given by:  Vladimir Creeks  Discharge Disposition:  [place]  Per UR Regulation:  Reviewed for med. necessity/level of care/duration of stay  If discussed at [place] of Stay Meetings, dates discussed:    Comments:  05/24/14 [place] RN/CM CSW aware of pending discharge disposition. [place] not anticipated over weekend. Will cont to follow.

## 2014-05-21 NOTE — Progress Notes (Signed)
ANTIBIOTIC CONSULT NOTE - INITIAL  Pharmacy Consult for Vancomycin and Cefepime Indication: pneumonia  Allergies  Allergen Reactions  . Iodine Rash and Other (See Comments)    BETADINE Rash/burning, blisters on skin.  . Iohexol Rash and Other (See Comments)    Blisters; PT NEEDS 13-HOUR PREP   . Tape Itching and Rash    Prefers PAPER TAPE   Patient Measurements: Height: 5\' 5"  (165.1 cm) Weight: 100 lb 15.5 oz (45.8 kg) IBW/kg (Calculated) : 57  Vital Signs: Temp: 97.7 F (36.5 C) (02/12 0440) Temp Source: Oral (02/12 0440) BP: 120/81 mmHg (02/12 0440) Pulse Rate: 79 (02/12 0440) Intake/Output from previous day: 02/11 0701 - 02/12 0700 In: 795 [I.V.:795] Out: 100 [Urine:100] Intake/Output from this shift: Total I/O In: 240 [P.O.:240] Out: 350 [Urine:350]  Labs:  Recent Labs  05/20/14 1803 05/21/14 0542  WBC 29.6* 31.0*  HGB 8.5* 7.6*  PLT SPECIMEN CHECKED FOR CLOTS 827*  CREATININE 0.66 0.56   Estimated Creatinine Clearance: 43.9 mL/min (by C-G formula based on Cr of 0.56). No results for input(s): VANCOTROUGH, VANCOPEAK, VANCORANDOM, GENTTROUGH, GENTPEAK, GENTRANDOM, TOBRATROUGH, TOBRAPEAK, TOBRARND, AMIKACINPEAK, AMIKACINTROU, AMIKACIN in the last 72 hours.   Microbiology: Recent Results (from the past 720 hour(s))  Blood culture (routine x 2)     Status: None (Preliminary result)   Collection Time: 05/20/14  8:05 PM  Result Value Ref Range Status   Specimen Description BLOOD RIGHT ARM  Final   Special Requests BOTTLES DRAWN AEROBIC AND ANAEROBIC 8CC EACH  Final   Culture NO GROWTH 1 DAY  Final   Report Status PENDING  Incomplete  Blood culture (routine x 2)     Status: None (Preliminary result)   Collection Time: 05/20/14  8:10 PM  Result Value Ref Range Status   Specimen Description BLOOD RIGHT HAND  Final   Special Requests BOTTLES DRAWN AEROBIC AND ANAEROBIC 6cc each  Final   Culture NO GROWTH 1 DAY  Final   Report Status PENDING  Incomplete    Medical History: Past Medical History  Diagnosis Date  . Subclavian steal syndrome   . Carotid bruit     LICA 02-54% (duplex 2/70)  . Hypertension   . Hyperlipidemia   . Atrial fibrillation     x 3 yrs  . Anticoagulation management encounter   . Meningioma   . Osteoporosis   . Stroke     CVA 2008  . Pneumonia   . Breast cancer     S/P left mastectomy, 1989 remained in remission  . Anemia    Medications:  Prescriptions prior to admission  Medication Sig Dispense Refill Last Dose  . acetaminophen (TYLENOL) 500 MG tablet Take 1,000 mg by mouth every 6 (six) hours as needed for mild pain or moderate pain.    05/20/2014 at Unknown time  . alendronate (FOSAMAX) 70 MG tablet Take 70 mg by mouth every Sunday. Take in the morning with a full glass of water, on an empty stomach, and do not take anything else by mouth or lie down for the next 30 min. Patient take on Sunday   Past Month at Unknown time  . amiodarone (PACERONE) 200 MG tablet Take 1 tablet by mouth  daily (Patient taking differently: Take 1 tablet by mouth  daily in the morning) 30 tablet 6 05/20/2014 at Unknown time  . amLODipine (NORVASC) 10 MG tablet Take 10 mg by mouth every evening.    05/19/2014 at Unknown time  . aspirin EC 81 MG tablet  Take 81 mg by mouth every morning.    05/20/2014 at Unknown time  . calcium-vitamin D (OSCAL) 250-125 MG-UNIT per tablet Take 1 tablet by mouth 2 (two) times daily.     05/20/2014 at Unknown time  . clopidogrel (PLAVIX) 75 MG tablet Take 1 tablet by mouth  every morning 90 tablet 0 05/20/2014 at Unknown time  . irbesartan (AVAPRO) 300 MG tablet Take 300 mg by mouth every evening.    05/19/2014 at Unknown time  . Iron-FA-B Cmp-C-Biot-Probiotic (FUSION PLUS) CAPS Take 1 capsule by mouth every morning.    05/20/2014 at Unknown time  . magnesium oxide (MAG-OX) 400 MG tablet Take 400 mg by mouth every morning.    05/20/2014 at Unknown time  . meloxicam (MOBIC) 15 MG tablet Take 15 mg by mouth every  morning.     05/20/2014 at Unknown time  . metoprolol tartrate (LOPRESSOR) 25 MG tablet Take 3 tablets (75 mg  total) by mouth 2 (two)  times daily. 540 tablet 1 05/20/2014 at 730a  . Multiple Vitamin (MULTIVITAMIN WITH MINERALS) TABS tablet Take 1 tablet by mouth every morning.   05/20/2014 at Unknown time  . Omega-3 Fatty Acids (FISH OIL) 1200 MG CAPS Take 1,200 mg by mouth 2 (two) times daily.   05/20/2014 at Unknown time  . oxyCODONE-acetaminophen (PERCOCET) 7.5-325 MG per tablet Take 0.5 tablets by mouth at bedtime as needed. For pain   05/19/2014 at Unknown time  . pravastatin (PRAVACHOL) 40 MG tablet Take 1 tablet by mouth  every night at bedtime 90 tablet 1 05/19/2014 at Unknown time  . vitamin C (ASCORBIC ACID) 500 MG tablet Take 500 mg by mouth every morning.    05/20/2014 at Unknown time  . warfarin (COUMADIN) 1 MG tablet Take 1 tablet (1 mg total) by mouth one time only at 6 PM. (Patient taking differently: Take 2-4 mg by mouth every evening. 2mg  on all days except Mondays. Take 4mg  on Mondays) 90 tablet 1 05/19/2014 at Unknown time  . WELCHOL 625 MG tablet Take 3 tablets by mouth  twice a day with a meal 180 tablet 3 05/20/2014 at Unknown time   Assessment: 74yo female admitted with necrotizing pneumonia.  Pt started on Vancomycin and Cefepime.  SCr is at baseline.  Pt has small body habitus.  Currently afebrile with elevated WBC.  Lactic acid is normal.  Initial doses of ABX given on admission.  Goal of Therapy:  Vancomycin trough level 15-20 mcg/ml Eradicate infection.  Plan:  Vancomycin 1000mg  IV q24hrs Check trough at steady state Cefepime 1000mg  IV q24hrs Monitor labs, renal fxn, and cultures  Hart Robinsons A 05/21/2014,12:30 PM

## 2014-05-22 DIAGNOSIS — I10 Essential (primary) hypertension: Secondary | ICD-10-CM

## 2014-05-22 DIAGNOSIS — D649 Anemia, unspecified: Secondary | ICD-10-CM

## 2014-05-22 LAB — BASIC METABOLIC PANEL
ANION GAP: 6 (ref 5–15)
BUN: 9 mg/dL (ref 6–23)
CALCIUM: 7.8 mg/dL — AB (ref 8.4–10.5)
CO2: 22 mmol/L (ref 19–32)
CREATININE: 0.5 mg/dL (ref 0.50–1.10)
Chloride: 108 mmol/L (ref 96–112)
GFR calc Af Amer: >90 mL/min (ref >90)
GLUCOSE: 84 mg/dL (ref 70–99)
Potassium: 3.3 mmol/L — ABNORMAL LOW (ref 3.5–5.1)
Sodium: 136 mmol/L (ref 135–145)

## 2014-05-22 LAB — CBC
HCT: 20.3 % — ABNORMAL LOW (ref 36.0–46.0)
HCT: 30.1 % — ABNORMAL LOW (ref 36.0–46.0)
HEMOGLOBIN: 6.4 g/dL — AB (ref 12.0–15.0)
Hemoglobin: 10.1 g/dL — ABNORMAL LOW (ref 12.0–15.0)
MCH: 27.9 pg (ref 26.0–34.0)
MCH: 29.2 pg (ref 26.0–34.0)
MCHC: 31.5 g/dL (ref 30.0–36.0)
MCHC: 33.6 g/dL (ref 30.0–36.0)
MCV: 88.6 fL (ref 78.0–100.0)
MCV: 87 fL (ref 78.0–100.0)
PLATELETS: 702 10*3/uL — AB (ref 150–400)
PLATELETS: 627 10*3/uL — AB (ref 150–400)
RBC: 2.29 MIL/uL — ABNORMAL LOW (ref 3.87–5.11)
RBC: 3.46 MIL/uL — ABNORMAL LOW (ref 3.87–5.11)
RDW: 15.7 % — ABNORMAL HIGH (ref 11.5–15.5)
RDW: 14.6 % (ref 11.5–15.5)
WBC: 21.2 10*3/uL — ABNORMAL HIGH (ref 4.0–10.5)
WBC: 19.9 10*3/uL — AB (ref 4.0–10.5)

## 2014-05-22 LAB — ABO/RH: ABO/RH(D): A POS

## 2014-05-22 LAB — PREPARE RBC (CROSSMATCH)

## 2014-05-22 LAB — PROTIME-INR
INR: 2.34 — AB (ref 0.00–1.49)
Prothrombin Time: 25.9 seconds — ABNORMAL HIGH (ref 11.6–15.2)

## 2014-05-22 MED ORDER — SODIUM CHLORIDE 0.9 % IV SOLN
Freq: Once | INTRAVENOUS | Status: AC
  Administered 2014-05-22: 08:00:00 via INTRAVENOUS

## 2014-05-22 MED ORDER — WARFARIN SODIUM 2 MG PO TABS
2.0000 mg | ORAL_TABLET | Freq: Once | ORAL | Status: AC
  Administered 2014-05-22: 2 mg via ORAL
  Filled 2014-05-22: qty 1

## 2014-05-22 NOTE — Progress Notes (Signed)
[facility] PROGRESS NOTE  [name] EYC:[id number] DOB: 06/16/1940 DOA: 05/20/2014 PCP: Delphina Cahill, MD  Assessment/Plan: Necrotizing PNA -Continue vanc/rocephin. -Clinical signs of improvement: afebrile, WBC count decreasing, non-toxic appearing. -Still with concern for underlying malignancy; will need repeat CT chest in 6 weeks following treatment for PNA.  AOCD -FOBT -. -Hb has decreased to 6.4 today from 8 on admission. -Suspect dilution is playing a role. -Is on triple anticoagulation with ASA/Plavix/coumadin for a fib and vascular disease; since no signs of active bleed, feel it is ok to continue for now. -Will be transfused 2 units of PRBCs today.  A Fib -Rate controlled. -Continue coumadin.  Thrombocytosis -Plt count 702 2/13.  HTN -Well controlled. -Continue current regimen.  Hyperlipidemia -Continue pravachol, welchol  Carotid Artery Disease -Continue ASA/Plavix and follow up with Dr. Trula Slade as scheduled.  Code Status: Full Code Family Communication: patient only  Disposition Plan: Home when ready; likely 48-72 hours.   Consultants:  None   Antibiotics:  Vanc  Cefepime   Subjective: No complaints.  Objective: Filed Vitals:   05/22/14 0455 05/22/14 0602 05/22/14 1130 05/22/14 1205  BP: 109/55 110/62 104/59 108/50  Pulse: 72 82 64 63  Temp:  98.3 F (36.8 C) 98.2 F (36.8 C) 98 F (36.7 C)  TempSrc:  Oral Oral Oral  Resp:  18 20 20   Height:      Weight:      SpO2:  96% 96% 95%    Intake/Output Summary (Last 24 hours) at 05/22/14 1334 Last data filed at 05/22/14 0800  Gross per 24 hour  Intake    483 ml  Output      0 ml  Net    483 ml   Filed Weights   05/20/14 1727 05/20/14 2247  Weight: 45.36 kg (100 lb) 45.8 kg (100 lb 15.5 oz)    Exam:   General:  AA Ox3, NAD  Cardiovascular: RRR, no M/R/G  Respiratory: CTA B  Abdomen: S/NT/ND/+BS  Extremities: no C/C/E   Neurologic:  Non-focal/intact  Data  Reviewed: Basic Metabolic Panel:  Recent Labs Lab 05/20/14 1803 05/21/14 0542 05/22/14 0603  NA 129* 136 136  K 4.2 3.8 3.3*  CL 100 103 108  CO2 22 23 22   GLUCOSE 103* 94 84  BUN 14 11 9   CREATININE 0.66 0.56 0.50  CALCIUM 8.1* 8.2* 7.8*   Liver Function Tests:  Recent Labs Lab 05/20/14 1803 05/21/14 0542  AST 22 23  ALT 28 24  ALKPHOS 203* 174*  BILITOT 0.4 0.5  PROT 7.3 6.6  ALBUMIN 2.1* 1.9*    Recent Labs Lab 05/20/14 1803  LIPASE 31   No results for input(s): AMMONIA in the last 168 hours. CBC:  Recent Labs Lab 05/20/14 1803 05/21/14 0542 05/22/14 0603  WBC 29.6* 31.0* 21.2*  NEUTROABS 25.1*  --   --   HGB 8.5* 7.6* 6.4*  HCT 26.2* 24.4* 20.3*  MCV 87.9 89.4 88.6  PLT SPECIMEN CHECKED FOR CLOTS 827* 702*   Cardiac Enzymes:  Recent Labs Lab 05/20/14 1803  TROPONINI <0.03   BNP (last 3 results) No results for input(s): BNP in the last 8760 hours.  ProBNP (last 3 results) No results for input(s): PROBNP in the last 8760 hours.  CBG: No results for input(s): GLUCAP in the last 168 hours.  Recent Results (from the past 240 hour(s))  Blood culture (routine x 2)     Status: None (Preliminary result)   Collection Time: 05/20/14  8:05 PM  Result Value Ref Range Status   Specimen Description BLOOD RIGHT ARM  Final   Special Requests BOTTLES DRAWN AEROBIC AND ANAEROBIC 8CC EACH  Final   Culture NO GROWTH 2 DAYS  Final   Report Status PENDING  Incomplete  Blood culture (routine x 2)     Status: None (Preliminary result)   Collection Time: 05/20/14  8:10 PM  Result Value Ref Range Status   Specimen Description BLOOD RIGHT HAND  Final   Special Requests BOTTLES DRAWN AEROBIC AND ANAEROBIC 6cc each  Final   Culture NO GROWTH 2 DAYS  Final   Report Status PENDING  Incomplete     Studies: Ct Chest Wo Contrast  05/20/2014   CLINICAL DATA:  Abnormal chest radiograph tonight. Consolidation noted in the right lower lung on acute abdominal series.  Patient has leukocytosis, weakness and cough for 1 week.  EXAM: CT CHEST WITHOUT CONTRAST  TECHNIQUE: Multidetector CT imaging of the chest was performed following the standard protocol without IV contrast.  COMPARISON:  Current chest radiograph.  FINDINGS: There is a heterogeneous masslike area of consolidation in the posterior right lower lobe. This contains mostly fluid attenuation with intervening areas of higher attenuation. It is surrounded by patchy airspace and coarse reticular type opacities. Masslike area measures 9.4 cm x 7 cm x 9.3 cm. It has mass effect bulging the right oblique fissure anteriorly and compressing adjacent lung. There are air bubbles within this.  There are few areas of mild reticular opacity in the posterior right upper lobe likely scarring and/ or atelectasis. A 4.4 mm nodule lies in the left lower lobe, image 42, series 3. Pleural-parenchymal scarring is noted at the apices, most prominently on the right. Mild changes of centrilobular emphysema are noted in the upper lobes.  There is mild right hilar and mediastinal adenopathy. A precarinal node measures 1 cm short axis.  Heart is normal in size and configuration. There are moderate coronary artery calcifications. Great vessels are normal in caliber.  There is a small hiatal hernia. Limited evaluation of the upper abdomen demonstrates aortic and branch vessel atherosclerotic calcifications but is otherwise unremarkable. No adrenal masses.  Marked compression fracture of T12. Large Schmorl's node noted in the upper endplate of L2. No osteoblastic or osteolytic lesions.  IMPRESSION: 1. Masslike area of consolidation in the right lower lobe. This may reflect pneumonia with necrosis. Neoplastic disease is possible. Coarse reticular and patchy airspace lung opacity in the adjacent along is likely combination of infection and compressive atelectasis. 2. Mild right hilar and mediastinal adenopathy. 3. Underlying COPD. Given the patient's  symptoms of cough and elevated white blood cell count, necrotic pneumonia is felt to be the most likely etiology. However, if the patient does not respond to treatment for pneumonia, biopsy would be indicated.   Electronically Signed   By: Lajean Manes M.D.   On: 05/20/2014 20:11   Dg Abd Acute W/chest  05/20/2014   CLINICAL DATA:  Generalized abdominal pain for 1 week. Chronic back pain worse for 3 days. Chronic compression fractures. History of breast cancer post mastectomy 1989.  EXAM: ACUTE ABDOMEN SERIES (ABDOMEN 2 VIEW & CHEST 1 VIEW)  COMPARISON:  Chest 01/13/2013 and 07/28/2010.  FINDINGS: There is a large masslike opacity in the right mid lung. CT is recommended to exclude neoplasm. Localized pneumonia is also within the differential diagnosis. Diffuse emphysematous changes in the lungs. Normal heart size and pulmonary vascularity. No blunting of costophrenic angles. No pneumothorax. Postoperative  changes of left mastectomy and surgical clips in the left axilla.  Lumbar scoliosis convex to the right with degenerative changes in the spine. Gas and stool throughout the colon with scattered gas in the small bowel. No small or large bowel distention. Findings likely represent normal variation or ileus. No free intra-abdominal air. No abnormal air-fluid levels. Vascular calcifications. No radiopaque stones.  IMPRESSION: Large masslike opacity in the right mid lung. CT recommended for further evaluation. Emphysematous changes in the lungs.  Scattered gas and stool within nondistended small and large bowel. No evidence of obstruction. Lumbar scoliosis and degenerative changes.   Electronically Signed   By: Lucienne Capers M.D.   On: 05/20/2014 19:00    Scheduled Meds: . amiodarone  200 mg Oral Daily  . antiseptic oral rinse  7 mL Mouth Rinse BID  . aspirin EC  81 mg Oral q morning - 10a  . ceFEPime (MAXIPIME) IV  1 g Intravenous Q24H  . clopidogrel  75 mg Oral Daily  . colesevelam  1,875 mg Oral BID  WC  . metoprolol tartrate  50 mg Oral BID  . pravastatin  40 mg Oral Daily  . sodium chloride  3 mL Intravenous Q12H  . vancomycin  1,000 mg Intravenous Q24H  . warfarin  2 mg Oral Once  . Warfarin - Pharmacist Dosing Inpatient   Does not apply Q24H   Continuous Infusions: . sodium chloride 75 mL/hr at 05/21/14 1947    Active Problems:   Essential hypertension   Atrial fibrillation   Necrotic pneumonia   Pneumonia   Anemia   Thrombocytosis   Protein-calorie malnutrition, severe   Generalized weakness    Time spent: 35 minutes. Greater than 50% of this time was spent in direct contact with the patient coordinating care.    Lelon Frohlich  [facility] Pager [phone]  If 7PM-7AM, please contact night-coverage at [email or web address], password [facility] 05/22/2014, 1:34 PM  LOS: 2 days

## 2014-05-22 NOTE — Plan of Care (Signed)
Problem: Phase II Progression Outcomes Goal: Wean O2 if indicated Outcome: Completed/Met Date Met:  05/22/14 Pt on room air     

## 2014-05-22 NOTE — Progress Notes (Signed)
[name] for Coumadin (chronic Rx PTA) Indication: atrial fibrillation  Allergies  Allergen Reactions  . Iodine Rash and Other (See Comments)    BETADINE Rash/burning, blisters on skin.  . Iohexol Rash and Other (See Comments)    Blisters; PT NEEDS 13-HOUR PREP   . Tape Itching and Rash    Prefers PAPER TAPE   Patient Measurements: Height: 5\' 5"  (165.1 cm) Weight: 100 lb 15.5 oz (45.8 kg) IBW/kg (Calculated) : 57  Vital Signs: Temp: 98.3 F (36.8 C) (02/13 0602) Temp Source: Oral (02/13 0602) BP: 110/62 mmHg (02/13 0602) Pulse Rate: 82 (02/13 0602)  Labs:  Recent Labs  05/20/14 1803 05/21/14 0542 05/21/14 0804 05/22/14 0603  HGB 8.5* 7.6*  --  6.4*  HCT 26.2* 24.4*  --  20.3*  PLT SPECIMEN CHECKED FOR CLOTS 827*  --  702*  LABPROT 26.8*  --  27.6* 25.9*  INR 2.45*  --  2.54* 2.34*  CREATININE 0.66 0.56  --  0.50  TROPONINI <0.03  --   --   --    Estimated Creatinine Clearance: 43.9 mL/min (by C-G formula based on Cr of 0.5).  Medications:  Prescriptions prior to admission  Medication Sig Dispense Refill Last Dose  . acetaminophen (TYLENOL) 500 MG tablet Take 1,000 mg by mouth every 6 (six) hours as needed for mild pain or moderate pain.    05/20/2014 at Unknown time  . alendronate (FOSAMAX) 70 MG tablet Take 70 mg by mouth every Sunday. Take in the morning with a full glass of water, on an empty stomach, and do not take anything else by mouth or lie down for the next 30 min. Patient take on Sunday   Past Month at Unknown time  . amiodarone (PACERONE) 200 MG tablet Take 1 tablet by mouth  daily (Patient taking differently: Take 1 tablet by mouth  daily in the morning) 30 tablet 6 05/20/2014 at Unknown time  . amLODipine (NORVASC) 10 MG tablet Take 10 mg by mouth every evening.    05/19/2014 at Unknown time  . aspirin EC 81 MG tablet Take 81 mg by mouth every morning.    05/20/2014 at Unknown time  . calcium-vitamin D (OSCAL)  250-125 MG-UNIT per tablet Take 1 tablet by mouth 2 (two) times daily.     05/20/2014 at Unknown time  . clopidogrel (PLAVIX) 75 MG tablet Take 1 tablet by mouth  every morning 90 tablet 0 05/20/2014 at Unknown time  . irbesartan (AVAPRO) 300 MG tablet Take 300 mg by mouth every evening.    05/19/2014 at Unknown time  . Iron-FA-B Cmp-C-Biot-Probiotic (FUSION PLUS) CAPS Take 1 capsule by mouth every morning.    05/20/2014 at Unknown time  . magnesium oxide (MAG-OX) 400 MG tablet Take 400 mg by mouth every morning.    05/20/2014 at Unknown time  . meloxicam (MOBIC) 15 MG tablet Take 15 mg by mouth every morning.     05/20/2014 at Unknown time  . metoprolol tartrate (LOPRESSOR) 25 MG tablet Take 3 tablets (75 mg  total) by mouth 2 (two)  times daily. 540 tablet 1 05/20/2014 at 730a  . Multiple Vitamin (MULTIVITAMIN WITH MINERALS) TABS tablet Take 1 tablet by mouth every morning.   05/20/2014 at Unknown time  . Omega-3 Fatty Acids (FISH OIL) 1200 MG CAPS Take 1,200 mg by mouth 2 (two) times daily.   05/20/2014 at Unknown time  . oxyCODONE-acetaminophen (PERCOCET) 7.5-325 MG per tablet Take 0.5 tablets by mouth  at bedtime as needed. For pain   05/19/2014 at Unknown time  . pravastatin (PRAVACHOL) 40 MG tablet Take 1 tablet by mouth  every night at bedtime 90 tablet 1 05/19/2014 at Unknown time  . vitamin C (ASCORBIC ACID) 500 MG tablet Take 500 mg by mouth every morning.    05/20/2014 at Unknown time  . warfarin (COUMADIN) 1 MG tablet Take 1 tablet (1 mg total) by mouth one time only at 6 PM. (Patient taking differently: Take 2-4 mg by mouth every evening. 2mg  on all days except Mondays. Take 4mg  on Mondays) 90 tablet 1 05/19/2014 at Unknown time  . WELCHOL 625 MG tablet Take 3 tablets by mouth  twice a day with a meal 180 tablet 3 05/20/2014 at Unknown time   Assessment: 74yo female on chronic Coumadin PTA.  Home dose listed above.  Hg decreased to 6.4.  Stool (-) for fecal occult blood.   Goal of Therapy:  INR  2-3   Plan:  Repeat Coumadin 2mg  today x 1. INR daily  Pricilla [name] 05/22/2014,7:53 AM

## 2014-05-23 DIAGNOSIS — I480 Paroxysmal atrial fibrillation: Secondary | ICD-10-CM

## 2014-05-23 LAB — TYPE AND SCREEN
ABO/RH(D): A POS
Antibody Screen: NEGATIVE
UNIT DIVISION: 0
UNIT DIVISION: 0

## 2014-05-23 LAB — CBC
HCT: 33.1 % — ABNORMAL LOW (ref 36.0–46.0)
HEMOGLOBIN: 10.9 g/dL — AB (ref 12.0–15.0)
MCH: 28.5 pg (ref 26.0–34.0)
MCHC: 32.9 g/dL (ref 30.0–36.0)
MCV: 86.6 fL (ref 78.0–100.0)
Platelets: 711 10*3/uL — ABNORMAL HIGH (ref 150–400)
RBC: 3.82 MIL/uL — ABNORMAL LOW (ref 3.87–5.11)
RDW: 15.2 % (ref 11.5–15.5)
WBC: 20 10*3/uL — ABNORMAL HIGH (ref 4.0–10.5)

## 2014-05-23 LAB — PROTIME-INR
INR: 1.87 — ABNORMAL HIGH (ref 0.00–1.49)
Prothrombin Time: 21.7 seconds — ABNORMAL HIGH (ref 11.6–15.2)

## 2014-05-23 LAB — VANCOMYCIN, TROUGH: Vancomycin Tr: 6.5 ug/mL — ABNORMAL LOW (ref 10.0–20.0)

## 2014-05-23 MED ORDER — WARFARIN SODIUM 2 MG PO TABS
3.0000 mg | ORAL_TABLET | Freq: Once | ORAL | Status: AC
  Administered 2014-05-23: 3 mg via ORAL
  Filled 2014-05-23 (×2): qty 1

## 2014-05-23 MED ORDER — OXYCODONE-ACETAMINOPHEN 5-325 MG PO TABS
1.5000 | ORAL_TABLET | Freq: Three times a day (TID) | ORAL | Status: DC | PRN
  Administered 2014-05-23 – 2014-05-24 (×3): 1.5 via ORAL
  Filled 2014-05-23 (×3): qty 2

## 2014-05-23 NOTE — Progress Notes (Signed)
[facility] PROGRESS NOTE  [name] VOZ:[id number] DOB: 1940/11/19 DOA: 05/20/2014 PCP: Delphina Cahill, MD  Assessment/Plan: Necrotizing PNA -Continue vanc/cefepime. -Clinical signs of improvement: afebrile, WBC count decreasing, non-toxic appearing. -Still with concern for underlying malignancy; will need repeat CT chest in 6 weeks following treatment for PNA.  AOCD -FOBT -. -Was transfused 2 units of PRBCs on 2/13 for a Hb of 6.4. Up to 10.1. -Suspect dilution is playing a role. -Is on triple anticoagulation with ASA/Plavix/coumadin for a fib and vascular disease; since no signs of active bleed, feel it is ok to continue for now.  A Fib -Rate controlled. -Continue coumadin.  Thrombocytosis -Plt count 711 2/14.  HTN -Well controlled. -Continue current regimen.  Hyperlipidemia -Continue pravachol, welchol  Carotid Artery Disease -Continue ASA/Plavix and follow up with Dr. Trula Slade as scheduled.  Code Status: Full Code Family Communication: patient only  Disposition Plan: Home when ready; likely 48-72 hours.   Consultants:  None   Antibiotics:  Vanc  Cefepime   Subjective: No complaints.  Objective: Filed Vitals:   05/22/14 1800 05/22/14 2118 05/23/14 0550 05/23/14 1332  BP: 120/55 108/56 138/58 106/59  Pulse: 70 79 73 66  Temp: 97.4 F (36.3 C) 98.2 F (36.8 C) 98.5 F (36.9 C) 97.6 F (36.4 C)  TempSrc: Oral Oral Oral Oral  Resp: 20 20 20 20   Height:      Weight:      SpO2: 98% 97% 96% 98%    Intake/Output Summary (Last 24 hours) at 05/23/14 1531 Last data filed at 05/23/14 1200  Gross per 24 hour  Intake    830 ml  Output      0 ml  Net    830 ml   Filed Weights   05/20/14 1727 05/20/14 2247  Weight: 45.36 kg (100 lb) 45.8 kg (100 lb 15.5 oz)    Exam:   General:  AA Ox3, NAD  Cardiovascular: RRR, no M/R/G  Respiratory: CTA B  Abdomen: S/NT/ND/+BS  Extremities: no C/C/E   Neurologic:  Non-focal/intact  Data  Reviewed: Basic Metabolic Panel:  Recent Labs Lab 05/20/14 1803 05/21/14 0542 05/22/14 0603  NA 129* 136 136  K 4.2 3.8 3.3*  CL 100 103 108  CO2 22 23 22   GLUCOSE 103* 94 84  BUN 14 11 9   CREATININE 0.66 0.56 0.50  CALCIUM 8.1* 8.2* 7.8*   Liver Function Tests:  Recent Labs Lab 05/20/14 1803 05/21/14 0542  AST 22 23  ALT 28 24  ALKPHOS 203* 174*  BILITOT 0.4 0.5  PROT 7.3 6.6  ALBUMIN 2.1* 1.9*    Recent Labs Lab 05/20/14 1803  LIPASE 31   No results for input(s): AMMONIA in the last 168 hours. CBC:  Recent Labs Lab 05/20/14 1803 05/21/14 0542 05/22/14 0603 05/22/14 1909 05/23/14 0606  WBC 29.6* 31.0* 21.2* 19.9* 20.0*  NEUTROABS 25.1*  --   --   --   --   HGB 8.5* 7.6* 6.4* 10.1* 10.9*  HCT 26.2* 24.4* 20.3* 30.1* 33.1*  MCV 87.9 89.4 88.6 87.0 86.6  PLT SPECIMEN CHECKED FOR CLOTS 827* 702* 627* 711*   Cardiac Enzymes:  Recent Labs Lab 05/20/14 1803  TROPONINI <0.03   BNP (last 3 results) No results for input(s): BNP in the last 8760 hours.  ProBNP (last 3 results) No results for input(s): PROBNP in the last 8760 hours.  CBG: No results for input(s): GLUCAP in the last 168 hours.  Recent Results (from the past 240  hour(s))  Blood culture (routine x 2)     Status: None (Preliminary result)   Collection Time: 05/20/14  8:05 PM  Result Value Ref Range Status   Specimen Description BLOOD RIGHT ARM  Final   Special Requests BOTTLES DRAWN AEROBIC AND ANAEROBIC 8CC EACH  Final   Culture NO GROWTH 3 DAYS  Final   Report Status PENDING  Incomplete  Blood culture (routine x 2)     Status: None (Preliminary result)   Collection Time: 05/20/14  8:10 PM  Result Value Ref Range Status   Specimen Description BLOOD RIGHT HAND  Final   Special Requests BOTTLES DRAWN AEROBIC AND ANAEROBIC 6cc each  Final   Culture NO GROWTH 3 DAYS  Final   Report Status PENDING  Incomplete     Studies: No results found.  Scheduled Meds: . amiodarone  200 mg  Oral Daily  . antiseptic oral rinse  7 mL Mouth Rinse BID  . aspirin EC  81 mg Oral q morning - 10a  . ceFEPime (MAXIPIME) IV  1 g Intravenous Q24H  . clopidogrel  75 mg Oral Daily  . colesevelam  1,875 mg Oral BID WC  . metoprolol tartrate  50 mg Oral BID  . pravastatin  40 mg Oral Daily  . sodium chloride  3 mL Intravenous Q12H  . vancomycin  1,000 mg Intravenous Q24H  . warfarin  3 mg Oral Once  . Warfarin - Pharmacist Dosing Inpatient   Does not apply Q24H   Continuous Infusions: . sodium chloride 75 mL/hr at 05/23/14 4210    Active Problems:   Essential hypertension   Atrial fibrillation   Necrotic pneumonia   Pneumonia   Anemia   Thrombocytosis   Protein-calorie malnutrition, severe   Generalized weakness    Time spent: 25 minutes. Greater than 50% of this time was spent in direct contact with the patient coordinating care.    Lelon Frohlich  [facility] Pager [phone]  If 7PM-7AM, please contact night-coverage at [email or web address], password [facility] 05/23/2014, 3:31 PM  LOS: 3 days

## 2014-05-23 NOTE — Progress Notes (Signed)
[place] for Coumadin (chronic Rx PTA) Indication: atrial fibrillation  Allergies  Allergen Reactions  . Iodine Rash and Other (See Comments)    BETADINE Rash/burning, blisters on skin.  . Iohexol Rash and Other (See Comments)    Blisters; PT NEEDS 13-HOUR PREP   . Tape Itching and Rash    Prefers PAPER TAPE   Patient Measurements: Height: 5\' 5"  (165.1 cm) Weight: 100 lb 15.5 oz (45.8 kg) IBW/kg (Calculated) : 57  Vital Signs: Temp: 98.5 F (36.9 C) (02/14 0550) Temp Source: Oral (02/14 0550) BP: 138/58 mmHg (02/14 0550) Pulse Rate: 73 (02/14 0550)  Labs:  Recent Labs  05/20/14 1803 05/21/14 0542 05/21/14 0804 05/22/14 0603 05/22/14 1909 05/23/14 0606  HGB 8.5* 7.6*  --  6.4* 10.1* 10.9*  HCT 26.2* 24.4*  --  20.3* 30.1* 33.1*  PLT SPECIMEN CHECKED FOR CLOTS 827*  --  702* 627* 711*  LABPROT 26.8*  --  27.6* 25.9*  --  21.7*  INR 2.45*  --  2.54* 2.34*  --  1.87*  CREATININE 0.66 0.56  --  0.50  --   --   TROPONINI <0.03  --   --   --   --   --    Estimated Creatinine Clearance: 43.9 mL/min (by C-G formula based on Cr of 0.5).  Medications:  Prescriptions prior to admission  Medication Sig Dispense Refill Last Dose  . acetaminophen (TYLENOL) 500 MG tablet Take 1,000 mg by mouth every 6 (six) hours as needed for mild pain or moderate pain.    05/20/2014 at Unknown time  . alendronate (FOSAMAX) 70 MG tablet Take 70 mg by mouth every Sunday. Take in the morning with a full glass of water, on an empty stomach, and do not take anything else by mouth or lie down for the next 30 min. Patient take on Sunday   Past Month at Unknown time  . amiodarone (PACERONE) 200 MG tablet Take 1 tablet by mouth  daily (Patient taking differently: Take 1 tablet by mouth  daily in the morning) 30 tablet 6 05/20/2014 at Unknown time  . amLODipine (NORVASC) 10 MG tablet Take 10 mg by mouth every evening.    05/19/2014 at Unknown time  . aspirin EC 81 MG  tablet Take 81 mg by mouth every morning.    05/20/2014 at Unknown time  . calcium-vitamin D (OSCAL) 250-125 MG-UNIT per tablet Take 1 tablet by mouth 2 (two) times daily.     05/20/2014 at Unknown time  . clopidogrel (PLAVIX) 75 MG tablet Take 1 tablet by mouth  every morning 90 tablet 0 05/20/2014 at Unknown time  . irbesartan (AVAPRO) 300 MG tablet Take 300 mg by mouth every evening.    05/19/2014 at Unknown time  . Iron-FA-B Cmp-C-Biot-Probiotic (FUSION PLUS) CAPS Take 1 capsule by mouth every morning.    05/20/2014 at Unknown time  . magnesium oxide (MAG-OX) 400 MG tablet Take 400 mg by mouth every morning.    05/20/2014 at Unknown time  . meloxicam (MOBIC) 15 MG tablet Take 15 mg by mouth every morning.     05/20/2014 at Unknown time  . metoprolol tartrate (LOPRESSOR) 25 MG tablet Take 3 tablets (75 mg  total) by mouth 2 (two)  times daily. 540 tablet 1 05/20/2014 at 730a  . Multiple Vitamin (MULTIVITAMIN WITH MINERALS) TABS tablet Take 1 tablet by mouth every morning.   05/20/2014 at Unknown time  . Omega-3 Fatty Acids (FISH OIL) 1200  MG CAPS Take 1,200 mg by mouth 2 (two) times daily.   05/20/2014 at Unknown time  . oxyCODONE-acetaminophen (PERCOCET) 7.5-325 MG per tablet Take 0.5 tablets by mouth at bedtime as needed. For pain   05/19/2014 at Unknown time  . pravastatin (PRAVACHOL) 40 MG tablet Take 1 tablet by mouth  every night at bedtime 90 tablet 1 05/19/2014 at Unknown time  . vitamin C (ASCORBIC ACID) 500 MG tablet Take 500 mg by mouth every morning.    05/20/2014 at Unknown time  . warfarin (COUMADIN) 1 MG tablet Take 1 tablet (1 mg total) by mouth one time only at 6 PM. (Patient taking differently: Take 2-4 mg by mouth every evening. 2mg  on all days except Mondays. Take 4mg  on Mondays) 90 tablet 1 05/19/2014 at Unknown time  . WELCHOL 625 MG tablet Take 3 tablets by mouth  twice a day with a meal 180 tablet 3 05/20/2014 at Unknown time   Assessment: 74yo female on chronic Coumadin PTA.  Home dose  listed above.  Hg improved s/p PRBCs.  Stool (-) for fecal occult blood.  INR below goal.  Also on Plavix, ASA, and amiodarone.   Goal of Therapy:  INR 2-3   Plan:  Repeat Coumadin 3 mg today x 1. INR daily  Pricilla Larsson 05/23/2014,10:49 AM

## 2014-05-24 LAB — CBC
HEMATOCRIT: 31.2 % — AB (ref 36.0–46.0)
HEMOGLOBIN: 10.2 g/dL — AB (ref 12.0–15.0)
MCH: 28.8 pg (ref 26.0–34.0)
MCHC: 32.7 g/dL (ref 30.0–36.0)
MCV: 88.1 fL (ref 78.0–100.0)
Platelets: 718 10*3/uL — ABNORMAL HIGH (ref 150–400)
RBC: 3.54 MIL/uL — AB (ref 3.87–5.11)
RDW: 15.4 % (ref 11.5–15.5)
WBC: 18.5 10*3/uL — AB (ref 4.0–10.5)

## 2014-05-24 LAB — BASIC METABOLIC PANEL
ANION GAP: 8 (ref 5–15)
BUN: 9 mg/dL (ref 6–23)
CO2: 24 mmol/L (ref 19–32)
CREATININE: 0.49 mg/dL — AB (ref 0.50–1.10)
Calcium: 8.2 mg/dL — ABNORMAL LOW (ref 8.4–10.5)
Chloride: 107 mmol/L (ref 96–112)
GFR calc non Af Amer: >90 mL/min (ref >90)
GLUCOSE: 78 mg/dL (ref 70–99)
Potassium: 3 mmol/L — ABNORMAL LOW (ref 3.5–5.1)
SODIUM: 139 mmol/L (ref 135–145)

## 2014-05-24 LAB — PROTIME-INR
INR: 1.74 — ABNORMAL HIGH (ref 0.00–1.49)
PROTHROMBIN TIME: 20.5 s — AB (ref 11.6–15.2)

## 2014-05-24 LAB — LEGIONELLA ANTIGEN, URINE

## 2014-05-24 MED ORDER — VANCOMYCIN HCL IN DEXTROSE 1-5 GM/200ML-% IV SOLN
1000.0000 mg | Freq: Two times a day (BID) | INTRAVENOUS | Status: DC
  Administered 2014-05-24: 1000 mg via INTRAVENOUS
  Filled 2014-05-24 (×3): qty 200

## 2014-05-24 MED ORDER — POTASSIUM CHLORIDE CRYS ER 20 MEQ PO TBCR
40.0000 meq | EXTENDED_RELEASE_TABLET | Freq: Once | ORAL | Status: AC
  Administered 2014-05-24: 40 meq via ORAL
  Filled 2014-05-24: qty 2

## 2014-05-24 MED ORDER — AMOXICILLIN-POT CLAVULANATE 875-125 MG PO TABS: 1.0000 | ORAL_TABLET | Freq: Two times a day (BID) | ORAL | Status: DC

## 2014-05-24 MED ORDER — WARFARIN SODIUM 2 MG PO TABS: 4.0000 mg | ORAL_TABLET | Freq: Once | ORAL | Status: DC

## 2014-05-24 MED ORDER — METOPROLOL TARTRATE 50 MG PO TABS: 50.0000 mg | ORAL_TABLET | Freq: Two times a day (BID) | ORAL | Status: DC

## 2014-05-24 NOTE — Progress Notes (Signed)
ANTIBIOTIC CONSULT NOTE - follow up  Pharmacy Consult for Vancomycin and Cefepime Indication: pneumonia  Allergies  Allergen Reactions  . Iodine Rash and Other (See Comments)    BETADINE Rash/burning, blisters on skin.  . Iohexol Rash and Other (See Comments)    Blisters; PT NEEDS 13-HOUR PREP   . Tape Itching and Rash    Prefers PAPER TAPE   Patient Measurements: Height: 5\' 5"  (165.1 cm) Weight: 100 lb 15.5 oz (45.8 kg) IBW/kg (Calculated) : 57  Vital Signs: Temp: 98 F (36.7 C) (02/15 0525) Temp Source: Oral (02/15 0525) BP: 129/57 mmHg (02/15 0525) Pulse Rate: 69 (02/15 0525) Intake/Output from previous day: 02/14 0701 - 02/15 0700 In: 1423 [P.O.:720; I.V.:703] Out: -  Intake/Output from this shift:    Labs:  Recent Labs  05/22/14 0603 05/22/14 1909 05/23/14 0606 05/24/14 0543  WBC 21.2* 19.9* 20.0* 18.5*  HGB 6.4* 10.1* 10.9* 10.2*  PLT 702* 627* 711* 718*  CREATININE 0.50  --   --  0.49*   Estimated Creatinine Clearance: 43.9 mL/min (by C-G formula based on Cr of 0.49).  Recent Labs  05/23/14 2124  [place] 6.5*    Microbiology: Recent Results (from the past 720 hour(s))  Blood culture (routine x 2)     Status: None (Preliminary result)   Collection Time: 05/20/14  8:05 PM  Result Value Ref Range Status   Specimen Description BLOOD RIGHT ARM  Final   Special Requests BOTTLES DRAWN AEROBIC AND ANAEROBIC 8CC EACH  Final   Culture NO GROWTH 3 DAYS  Final   Report Status PENDING  Incomplete  Blood culture (routine x 2)     Status: None (Preliminary result)   Collection Time: 05/20/14  8:10 PM  Result Value Ref Range Status   Specimen Description BLOOD RIGHT HAND  Final   Special Requests BOTTLES DRAWN AEROBIC AND ANAEROBIC 6cc each  Final   Culture NO GROWTH 3 DAYS  Final   Report Status PENDING  Incomplete   Medical History: Past Medical History  Diagnosis Date  . Subclavian steal syndrome   . Carotid bruit     LICA 62-83% (duplex 1/51)   . Hypertension   . Hyperlipidemia   . Atrial fibrillation     x 3 yrs  . Anticoagulation management encounter   . Meningioma   . Osteoporosis   . Stroke     CVA 2008  . Pneumonia   . Breast cancer     S/P left mastectomy, 1989 remained in remission  . Anemia    Medications:  Prescriptions prior to admission  Medication Sig Dispense Refill Last Dose  . acetaminophen (TYLENOL) 500 MG tablet Take 1,000 mg by mouth every 6 (six) hours as needed for mild pain or moderate pain.    05/20/2014 at Unknown time  . alendronate (FOSAMAX) 70 MG tablet Take 70 mg by mouth every Sunday. Take in the morning with a full glass of water, on an empty stomach, and do not take anything else by mouth or lie down for the next 30 min. Patient take on Sunday   Past Month at Unknown time  . amiodarone (PACERONE) 200 MG tablet Take 1 tablet by mouth  daily (Patient taking differently: Take 1 tablet by mouth  daily in the morning) 30 tablet 6 05/20/2014 at Unknown time  . amLODipine (NORVASC) 10 MG tablet Take 10 mg by mouth every evening.    05/19/2014 at Unknown time  . aspirin EC 81 MG tablet Take 81  mg by mouth every morning.    05/20/2014 at Unknown time  . calcium-vitamin D (OSCAL) 250-125 MG-UNIT per tablet Take 1 tablet by mouth 2 (two) times daily.     05/20/2014 at Unknown time  . clopidogrel (PLAVIX) 75 MG tablet Take 1 tablet by mouth  every morning 90 tablet 0 05/20/2014 at Unknown time  . irbesartan (AVAPRO) 300 MG tablet Take 300 mg by mouth every evening.    05/19/2014 at Unknown time  . Iron-FA-B Cmp-C-Biot-Probiotic (FUSION PLUS) CAPS Take 1 capsule by mouth every morning.    05/20/2014 at Unknown time  . magnesium oxide (MAG-OX) 400 MG tablet Take 400 mg by mouth every morning.    05/20/2014 at Unknown time  . meloxicam (MOBIC) 15 MG tablet Take 15 mg by mouth every morning.     05/20/2014 at Unknown time  . metoprolol tartrate (LOPRESSOR) 25 MG tablet Take 3 tablets (75 mg  total) by mouth 2 (two)  times  daily. 540 tablet 1 05/20/2014 at 730a  . Multiple Vitamin (MULTIVITAMIN WITH MINERALS) TABS tablet Take 1 tablet by mouth every morning.   05/20/2014 at Unknown time  . Omega-3 Fatty Acids (FISH OIL) 1200 MG CAPS Take 1,200 mg by mouth 2 (two) times daily.   05/20/2014 at Unknown time  . oxyCODONE-acetaminophen (PERCOCET) 7.5-325 MG per tablet Take 0.5 tablets by mouth at bedtime as needed. For pain   05/19/2014 at Unknown time  . pravastatin (PRAVACHOL) 40 MG tablet Take 1 tablet by mouth  every night at bedtime 90 tablet 1 05/19/2014 at Unknown time  . vitamin C (ASCORBIC ACID) 500 MG tablet Take 500 mg by mouth every morning.    05/20/2014 at Unknown time  . warfarin (COUMADIN) 1 MG tablet Take 1 tablet (1 mg total) by mouth one time only at 6 PM. (Patient taking differently: Take 2-4 mg by mouth every evening. 2mg  on all days except Mondays. Take 4mg  on Mondays) 90 tablet 1 05/19/2014 at Unknown time  . WELCHOL 625 MG tablet Take 3 tablets by mouth  twice a day with a meal 180 tablet 3 05/20/2014 at Unknown time   Assessment: 74yo female admitted with necrotizing pneumonia.  Pt started on Vancomycin and Cefepime.  SCr is at baseline.  Pt has small body habitus.  Currently afebrile with elevated WBC.  Lactic acid is normal.  Trough level is below goal.  Goal of Therapy:  Vancomycin trough level 15-20 mcg/ml Eradicate infection.  Plan:  Increase Vancomycin to 1000mg  IV q12hrs Check trough at steady state Cefepime 1000mg  IV q24hrs Monitor labs, renal fxn, and cultures  Hart Robinsons A 05/24/2014,7:41 AM

## 2014-05-24 NOTE — Discharge Summary (Signed)
Physician Discharge Summary  [name] UEA:[id number] DOB: 09/17/1940 DOA: 05/20/2014  PCP: Delphina Cahill, MD  Admit date: 05/20/2014 Discharge date: 05/24/2014  Time spent: 40 minutes  Recommendations for Outpatient Follow-up:  1. Follow up with Dr Nevada Crane in 1-2 weeks for evaluation of resolution of PNA. Will need repeat CT chest in 6 weeks as still concern for underlying malignancy. Monitor blood pressure as norvasc and avapro discontinued and BB dose decreased. Recommend CBC to track Hg and platelets 2. OP PT  Discharge Diagnoses:  Active Problems:   Essential hypertension   Atrial fibrillation   Necrotic pneumonia   Pneumonia   Anemia   Thrombocytosis   Protein-calorie malnutrition, severe   Generalized weakness   Discharge Condition: stable  Diet recommendation: heart healthy  Filed Weights   05/20/14 1727 05/20/14 2247  Weight: 45.36 kg (100 lb) 45.8 kg (100 lb 15.5 oz)    History of present illness:  74 year old female who  has a past medical history of Subclavian steal syndrome; Carotid bruit; Hypertension; Hyperlipidemia; Atrial fibrillation; Anticoagulation management encounter; Meningioma; Osteoporosis; Stroke; Pneumonia; Breast cancer; and Anemia, presented to ED on 05/20/14 with chief complaint of generalized weakness, poor by mouth intake and 15 pounds weight loss over past several weeks. Patient with a history of chronic back pain with scoliosis, osteoarthritis and T12 compression fracture. Patient takes oxycodone for the back pain. She denied nausea vomiting or diarrhea. No dysuria urgency frequency of urination. She denied chest pain no shortness of breath. She has a remote history of breast cancer with left mastectomy in 1989.  in the ED CT chest was done  which showed masslike area of consolidation in the right lower lobe which may reflect pneumonia with necrosis. Neoplastic disease possible  Hospital Course:  Necrotizing PNA Received 4 days of Continue  vanc/cefepime. She gradually improved. At discharge she is afebrile, WBC count decreasing, non-toxic appearing. Still with concern for underlying malignancy; will need repeat CT chest in 6 weeks following treatment for PNA. Will discharge with augmentin 10 days.  AOCD FOBT pending at discharge. Was transfused 2 units of PRBCs on 2/13 for a Hb of 6.4. Up to 10.2 at discharge. Suspect dilution  playing a role. Is on triple anticoagulation with ASA/Plavix/coumadin for a fib and vascular disease; since no signs of active bleed. Follow up with PCP 1-2 weeks as noted above  A Fib -remained rate controlled. Continue coumadin. Follow up with clinic for INR check.   Thrombocytosis -Plt count 711 2/14.  HTN -Well controlled. -Continue current regimen.  Hyperlipidemia -Continue pravachol, welchol  Carotid Artery Disease -Continue ASA/Plavix and follow up with Dr. Trula Slade as scheduled.   Procedures:  none  Consultations:  none  Discharge Exam: Filed Vitals:   05/24/14 0525  BP: 129/57  Pulse: 69  Temp: 98 F (36.7 C)  Resp: 20    General: thin somewhat frail appearing. NAD Cardiovascular: irregularly irregular. No MGR no LE edema Respiratory: normal effort BS somewhat but clear no wheezse  Discharge Instructions    Current Discharge Medication List    CONTINUE these medications which have CHANGED   Details  metoprolol (LOPRESSOR) 50 MG tablet Take 1 tablet (50 mg total) by mouth 2 (two) times daily. Qty: 60 tablet, Refills: 0      CONTINUE these medications which have NOT CHANGED   Details  acetaminophen (TYLENOL) 500 MG tablet Take 1,000 mg by mouth every 6 (six) hours as needed for mild pain or moderate pain.  alendronate (FOSAMAX) 70 MG tablet Take 70 mg by mouth every Sunday. Take in the morning with a full glass of water, on an empty stomach, and do not take anything else by mouth or lie down for the next 30 min. Patient take on Sunday    amiodarone  (PACERONE) 200 MG tablet Take 1 tablet by mouth  daily Qty: 30 tablet, Refills: 6    amLODipine (NORVASC) 10 MG tablet Take 10 mg by mouth every evening.     aspirin EC 81 MG tablet Take 81 mg by mouth every morning.     calcium-vitamin D (OSCAL) 250-125 MG-UNIT per tablet Take 1 tablet by mouth 2 (two) times daily.      clopidogrel (PLAVIX) 75 MG tablet Take 1 tablet by mouth  every morning Qty: 90 tablet, Refills: 0    irbesartan (AVAPRO) 300 MG tablet Take 300 mg by mouth every evening.     Iron-FA-B Cmp-C-Biot-Probiotic (FUSION PLUS) CAPS Take 1 capsule by mouth every morning.     magnesium oxide (MAG-OX) 400 MG tablet Take 400 mg by mouth every morning.     meloxicam (MOBIC) 15 MG tablet Take 15 mg by mouth every morning.      Multiple Vitamin (MULTIVITAMIN WITH MINERALS) TABS tablet Take 1 tablet by mouth every morning.    Omega-3 Fatty Acids (FISH OIL) 1200 MG CAPS Take 1,200 mg by mouth 2 (two) times daily.    oxyCODONE-acetaminophen (PERCOCET) 7.5-325 MG per tablet Take 0.5 tablets by mouth at bedtime as needed. For pain    pravastatin (PRAVACHOL) 40 MG tablet Take 1 tablet by mouth  every night at bedtime Qty: 90 tablet, Refills: 1    vitamin C (ASCORBIC ACID) 500 MG tablet Take 500 mg by mouth every morning.     warfarin (COUMADIN) 1 MG tablet Take 1 tablet (1 mg total) by mouth one time only at 6 PM. Qty: 90 tablet, Refills: 1    WELCHOL 625 MG tablet Take 3 tablets by mouth  twice a day with a meal Qty: 180 tablet, Refills: 3       Allergies  Allergen Reactions  . Iodine Rash and Other (See Comments)    BETADINE Rash/burning, blisters on skin.  . Iohexol Rash and Other (See Comments)    Blisters; PT NEEDS 13-HOUR PREP   . Tape Itching and Rash    Prefers PAPER TAPE   Follow-up Information    Follow up with Delphina Cahill, MD.   Specialty:  Internal Medicine   Contact information:   [place] [id number]        The results of significant diagnostics  from this hospitalization (including imaging, microbiology, ancillary and laboratory) are listed below for reference.    Significant Diagnostic Studies: Ct Chest Wo Contrast  05/20/2014   CLINICAL DATA:  Abnormal chest radiograph tonight. Consolidation noted in the right lower lung on acute abdominal series. Patient has leukocytosis, weakness and cough for 1 week.  EXAM: CT CHEST WITHOUT CONTRAST  TECHNIQUE: Multidetector CT imaging of the chest was performed following the standard protocol without IV contrast.  COMPARISON:  Current chest radiograph.  FINDINGS: There is a heterogeneous masslike area of consolidation in the posterior right lower lobe. This contains mostly fluid attenuation with intervening areas of higher attenuation. It is surrounded by patchy airspace and coarse reticular type opacities. Masslike area measures 9.4 cm x 7 cm x 9.3 cm. It has mass effect bulging the right oblique fissure anteriorly and compressing adjacent lung. There  are air bubbles within this.  There are few areas of mild reticular opacity in the posterior right upper lobe likely scarring and/ or atelectasis. A 4.4 mm nodule lies in the left lower lobe, image 42, series 3. Pleural-parenchymal scarring is noted at the apices, most prominently on the right. Mild changes of centrilobular emphysema are noted in the upper lobes.  There is mild right hilar and mediastinal adenopathy. A precarinal node measures 1 cm short axis.  Heart is normal in size and configuration. There are moderate coronary artery calcifications. Great vessels are normal in caliber.  There is a small hiatal hernia. Limited evaluation of the upper abdomen demonstrates aortic and branch vessel atherosclerotic calcifications but is otherwise unremarkable. No adrenal masses.  Marked compression fracture of T12. Large Schmorl's node noted in the upper endplate of L2. No osteoblastic or osteolytic lesions.  IMPRESSION: 1. Masslike area of consolidation in the right  lower lobe. This may reflect pneumonia with necrosis. Neoplastic disease is possible. Coarse reticular and patchy airspace lung opacity in the adjacent along is likely combination of infection and compressive atelectasis. 2. Mild right hilar and mediastinal adenopathy. 3. Underlying COPD. Given the patient's symptoms of cough and elevated white blood cell count, necrotic pneumonia is felt to be the most likely etiology. However, if the patient does not respond to treatment for pneumonia, biopsy would be indicated.   Electronically Signed   By: Lajean Manes M.D.   On: 05/20/2014 20:11   Dg Abd Acute W/chest  05/20/2014   CLINICAL DATA:  Generalized abdominal pain for 1 week. Chronic back pain worse for 3 days. Chronic compression fractures. History of breast cancer post mastectomy 1989.  EXAM: ACUTE ABDOMEN SERIES (ABDOMEN 2 VIEW & CHEST 1 VIEW)  COMPARISON:  Chest 01/13/2013 and 07/28/2010.  FINDINGS: There is a large masslike opacity in the right mid lung. CT is recommended to exclude neoplasm. Localized pneumonia is also within the differential diagnosis. Diffuse emphysematous changes in the lungs. Normal heart size and pulmonary vascularity. No blunting of costophrenic angles. No pneumothorax. Postoperative changes of left mastectomy and surgical clips in the left axilla.  Lumbar scoliosis convex to the right with degenerative changes in the spine. Gas and stool throughout the colon with scattered gas in the small bowel. No small or large bowel distention. Findings likely represent normal variation or ileus. No free intra-abdominal air. No abnormal air-fluid levels. Vascular calcifications. No radiopaque stones.  IMPRESSION: Large masslike opacity in the right mid lung. CT recommended for further evaluation. Emphysematous changes in the lungs.  Scattered gas and stool within nondistended small and large bowel. No evidence of obstruction. Lumbar scoliosis and degenerative changes.   Electronically Signed   By:  Lucienne Capers M.D.   On: 05/20/2014 19:00    Microbiology: Recent Results (from the past 240 hour(s))  Blood culture (routine x 2)     Status: None (Preliminary result)   Collection Time: 05/20/14  8:05 PM  Result Value Ref Range Status   Specimen Description BLOOD RIGHT ARM  Final   Special Requests BOTTLES DRAWN AEROBIC AND ANAEROBIC 8CC EACH  Final   Culture NO GROWTH 3 DAYS  Final   Report Status PENDING  Incomplete  Blood culture (routine x 2)     Status: None (Preliminary result)   Collection Time: 05/20/14  8:10 PM  Result Value Ref Range Status   Specimen Description BLOOD RIGHT HAND  Final   Special Requests BOTTLES DRAWN AEROBIC AND ANAEROBIC 6cc each  Final   Culture NO GROWTH 3 DAYS  Final   Report Status PENDING  Incomplete     Labs: Basic Metabolic Panel:  Recent Labs Lab 05/20/14 1803 05/21/14 0542 05/22/14 0603 05/24/14 0543  NA 129* 136 136 139  K 4.2 3.8 3.3* 3.0*  CL 100 103 108 107  CO2 22 23 22 24   GLUCOSE 103* 94 84 78  BUN 14 11 9 9   CREATININE 0.66 0.56 0.50 0.49*  CALCIUM 8.1* 8.2* 7.8* 8.2*   Liver Function Tests:  Recent Labs Lab 05/20/14 1803 05/21/14 0542  AST 22 23  ALT 28 24  ALKPHOS 203* 174*  BILITOT 0.4 0.5  PROT 7.3 6.6  ALBUMIN 2.1* 1.9*    Recent Labs Lab 05/20/14 1803  LIPASE 31   No results for input(s): AMMONIA in the last 168 hours. CBC:  Recent Labs Lab 05/20/14 1803 05/21/14 0542 05/22/14 0603 05/22/14 1909 05/23/14 0606 05/24/14 0543  WBC 29.6* 31.0* 21.2* 19.9* 20.0* 18.5*  NEUTROABS 25.1*  --   --   --   --   --   HGB 8.5* 7.6* 6.4* 10.1* 10.9* 10.2*  HCT 26.2* 24.4* 20.3* 30.1* 33.1* 31.2*  MCV 87.9 89.4 88.6 87.0 86.6 88.1  PLT SPECIMEN CHECKED FOR CLOTS 827* 702* 627* 711* 718*   Cardiac Enzymes:  Recent Labs Lab 05/20/14 1803  TROPONINI <0.03   BNP: BNP (last 3 results) No results for input(s): BNP in the last 8760 hours.  ProBNP (last 3 results) No results for input(s): PROBNP  in the last 8760 hours.  CBG: No results for input(s): GLUCAP in the last 168 hours.     SignedRadene Gunning  [facility] 05/24/2014, 10:02 AM

## 2014-05-24 NOTE — Progress Notes (Signed)
Discharge instructions and prescriptions reviewed and discussed all questions were answered. Follow up appointments were reviewed and discussed with patient. Pt's IV catheter removed and intact. Pt's IV site clean dry and intact. Pt will be escorted by nurse tech.

## 2014-05-24 NOTE — Progress Notes (Signed)
[place] for Coumadin (chronic Rx PTA) Indication: atrial fibrillation  Allergies  Allergen Reactions  . Iodine Rash and Other (See Comments)    BETADINE Rash/burning, blisters on skin.  . Iohexol Rash and Other (See Comments)    Blisters; PT NEEDS 13-HOUR PREP   . Tape Itching and Rash    Prefers PAPER TAPE   Patient Measurements: Height: 5\' 5"  (165.1 cm) Weight: 100 lb 15.5 oz (45.8 kg) IBW/kg (Calculated) : 57  Vital Signs: Temp: 98 F (36.7 C) (02/15 0525) Temp Source: Oral (02/15 0525) BP: 129/57 mmHg (02/15 0525) Pulse Rate: 69 (02/15 0525)  Labs:  Recent Labs  05/22/14 0603 05/22/14 1909 05/23/14 0606 05/24/14 0543  HGB 6.4* 10.1* 10.9* 10.2*  HCT 20.3* 30.1* 33.1* 31.2*  PLT 702* 627* 711* 718*  LABPROT 25.9*  --  21.7* 20.5*  INR 2.34*  --  1.87* 1.74*  CREATININE 0.50  --   --  0.49*   Estimated Creatinine Clearance: 43.9 mL/min (by C-G formula based on Cr of 0.49).  Medications:  Prescriptions prior to admission  Medication Sig Dispense Refill Last Dose  . acetaminophen (TYLENOL) 500 MG tablet Take 1,000 mg by mouth every 6 (six) hours as needed for mild pain or moderate pain.    05/20/2014 at Unknown time  . alendronate (FOSAMAX) 70 MG tablet Take 70 mg by mouth every Sunday. Take in the morning with a full glass of water, on an empty stomach, and do not take anything else by mouth or lie down for the next 30 min. Patient take on Sunday   Past Month at Unknown time  . amiodarone (PACERONE) 200 MG tablet Take 1 tablet by mouth  daily (Patient taking differently: Take 1 tablet by mouth  daily in the morning) 30 tablet 6 05/20/2014 at Unknown time  . amLODipine (NORVASC) 10 MG tablet Take 10 mg by mouth every evening.    05/19/2014 at Unknown time  . aspirin EC 81 MG tablet Take 81 mg by mouth every morning.    05/20/2014 at Unknown time  . calcium-vitamin D (OSCAL) 250-125 MG-UNIT per tablet Take 1 tablet by mouth 2  (two) times daily.     05/20/2014 at Unknown time  . clopidogrel (PLAVIX) 75 MG tablet Take 1 tablet by mouth  every morning 90 tablet 0 05/20/2014 at Unknown time  . irbesartan (AVAPRO) 300 MG tablet Take 300 mg by mouth every evening.    05/19/2014 at Unknown time  . Iron-FA-B Cmp-C-Biot-Probiotic (FUSION PLUS) CAPS Take 1 capsule by mouth every morning.    05/20/2014 at Unknown time  . magnesium oxide (MAG-OX) 400 MG tablet Take 400 mg by mouth every morning.    05/20/2014 at Unknown time  . meloxicam (MOBIC) 15 MG tablet Take 15 mg by mouth every morning.     05/20/2014 at Unknown time  . metoprolol tartrate (LOPRESSOR) 25 MG tablet Take 3 tablets (75 mg  total) by mouth 2 (two)  times daily. 540 tablet 1 05/20/2014 at 730a  . Multiple Vitamin (MULTIVITAMIN WITH MINERALS) TABS tablet Take 1 tablet by mouth every morning.   05/20/2014 at Unknown time  . Omega-3 Fatty Acids (FISH OIL) 1200 MG CAPS Take 1,200 mg by mouth 2 (two) times daily.   05/20/2014 at Unknown time  . oxyCODONE-acetaminophen (PERCOCET) 7.5-325 MG per tablet Take 0.5 tablets by mouth at bedtime as needed. For pain   05/19/2014 at Unknown time  . pravastatin (PRAVACHOL) 40 MG tablet  Take 1 tablet by mouth  every night at bedtime 90 tablet 1 05/19/2014 at Unknown time  . vitamin C (ASCORBIC ACID) 500 MG tablet Take 500 mg by mouth every morning.    05/20/2014 at Unknown time  . warfarin (COUMADIN) 1 MG tablet Take 1 tablet (1 mg total) by mouth one time only at 6 PM. (Patient taking differently: Take 2-4 mg by mouth every evening. 2mg  on all days except Mondays. Take 4mg  on Mondays) 90 tablet 1 05/19/2014 at Unknown time  . WELCHOL 625 MG tablet Take 3 tablets by mouth  twice a day with a meal 180 tablet 3 05/20/2014 at Unknown time   Assessment: 74yo female on chronic Coumadin PTA.  Home dose listed above.  Hg improved s/p PRBCs.  Stool (-) for fecal occult blood.  INR below goal.  Also on Plavix, ASA, and amiodarone.   Goal of Therapy:   INR 2-3   Plan:  Coumadin 4mg  today x 1. INR daily  Hart Robinsons A 05/24/2014,7:45 AM

## 2014-05-24 NOTE — Evaluation (Signed)
Physical Therapy Evaluation Patient Details Name: [name] MRN: [id number] DOB: 1940/09/21 Today's Date: 05/24/2014   History of Present Illness  Pt is a 74 year old female with multiple medical illnesses who was admitted with pneumonia.  She lives with her husband and is normally independent at home, ambulates behind a transport w/c.  Clinical Impression   Pt was seen for evaluation and found to be close to functional baseline.  She had been receiving OPPT due to old lumbar compression fractures and would like to resume this after discharge.    Follow Up Recommendations Outpatient PT (resume OPPT-pt already has appointments made)    Equipment Recommendations  None recommended by PT    Recommendations for Other Services   none    Precautions / Restrictions Precautions Precautions: Fall Restrictions Weight Bearing Restrictions: No      Mobility  Bed Mobility Overal bed mobility: Independent                Transfers Overall transfer level: Independent                  Ambulation/Gait Ambulation/Gait assistance: Modified independent (Device/Increase time) Ambulation Distance (Feet): 80 Feet Assistive device: 4-wheeled walker Gait Pattern/deviations: Decreased dorsiflexion - left     General Gait Details: mild drop foot, does not wear a brace  Stairs            Wheelchair Mobility    Modified Rankin (Stroke Patients Only)       Balance Overall balance assessment: No apparent balance deficits (not formally assessed)                                           Pertinent Vitals/Pain Pain Assessment: No/denies pain    Home Living Family/patient expects to be discharged to:: Private residence Living Arrangements: Spouse/significant other Available Help at Discharge: Family;Available 24 hours/day Type of Home: House Home Access: Stairs to enter Entrance Stairs-Rails: Right;Left;Can reach both Entrance Stairs-Number of  Steps: 3 Home Layout: One level Home Equipment: Wheelchair - manual      Prior Function Level of Independence: Independent with assistive device(s)               Hand Dominance        Extremity/Trunk Assessment   Upper Extremity Assessment: Overall WFL for tasks assessed           Lower Extremity Assessment: LLE deficits/detail   LLE Deficits / Details: mild drop foot secondary to spinal dysfunction  Cervical / Trunk Assessment: Kyphotic  Communication   Communication: No difficulties  Cognition Arousal/Alertness: Awake/alert Behavior During Therapy: WFL for tasks assessed/performed Overall Cognitive Status: Within Functional Limits for tasks assessed                      General Comments      Exercises        Assessment/Plan    PT Assessment All further PT needs can be met in the next venue of care  PT Diagnosis Difficulty walking   PT Problem List Decreased activity tolerance;Decreased mobility  PT Treatment Interventions     PT Goals (Current goals can be found in the Care Plan section) Acute Rehab PT Goals PT Goal Formulation: All assessment and education complete, DC therapy    Frequency     Barriers to discharge  none  Co-evaluation               End of Session Equipment Utilized During Treatment: Gait belt Activity Tolerance: Patient tolerated treatment well Patient left: in chair;with call bell/phone within reach Nurse Communication: Mobility status         Time: 0902-0929 PT Time Calculation (min) (ACUTE ONLY): 27 min   Charges:   PT Evaluation $Initial PT Evaluation Tier I: 1 Procedure     PT G CodesSable Feil 05/24/2014, 9:39 AM

## 2014-05-25 NOTE — Care Management Utilization Note (Signed)
UR completed 

## 2014-05-26 ENCOUNTER — Encounter (HOSPITAL_COMMUNITY): Payer: Medicare Other | Admitting: Physical Therapy

## 2014-05-26 LAB — CULTURE, BLOOD (ROUTINE X 2)
CULTURE: NO GROWTH
CULTURE: NO GROWTH

## 2014-05-27 ENCOUNTER — Encounter (INDEPENDENT_AMBULATORY_CARE_PROVIDER_SITE_OTHER): Payer: Self-pay | Admitting: *Deleted

## 2014-05-27 ENCOUNTER — Other Ambulatory Visit (INDEPENDENT_AMBULATORY_CARE_PROVIDER_SITE_OTHER): Payer: Self-pay | Admitting: *Deleted

## 2014-05-27 DIAGNOSIS — R748 Abnormal levels of other serum enzymes: Secondary | ICD-10-CM

## 2014-05-28 ENCOUNTER — Encounter (HOSPITAL_COMMUNITY): Payer: Medicare Other | Admitting: Physical Therapy

## 2014-05-31 ENCOUNTER — Ambulatory Visit (INDEPENDENT_AMBULATORY_CARE_PROVIDER_SITE_OTHER): Payer: Medicare Other | Admitting: *Deleted

## 2014-05-31 ENCOUNTER — Ambulatory Visit (HOSPITAL_COMMUNITY): Payer: Medicare Other | Admitting: Physical Therapy

## 2014-05-31 DIAGNOSIS — R29898 Other symptoms and signs involving the musculoskeletal system: Secondary | ICD-10-CM

## 2014-05-31 DIAGNOSIS — I480 Paroxysmal atrial fibrillation: Secondary | ICD-10-CM

## 2014-05-31 DIAGNOSIS — Z5181 Encounter for therapeutic drug level monitoring: Secondary | ICD-10-CM

## 2014-05-31 DIAGNOSIS — M6281 Muscle weakness (generalized): Secondary | ICD-10-CM | POA: Diagnosis not present

## 2014-05-31 DIAGNOSIS — I4891 Unspecified atrial fibrillation: Secondary | ICD-10-CM

## 2014-05-31 DIAGNOSIS — R262 Difficulty in walking, not elsewhere classified: Secondary | ICD-10-CM

## 2014-05-31 DIAGNOSIS — R278 Other lack of coordination: Secondary | ICD-10-CM | POA: Diagnosis not present

## 2014-05-31 DIAGNOSIS — R2689 Other abnormalities of gait and mobility: Secondary | ICD-10-CM

## 2014-05-31 LAB — POCT INR: INR: 1.8

## 2014-05-31 NOTE — Therapy (Signed)
[place], [place], [address] Phone: [phone]   Fax:  [phone]  Physical Therapy Treatment  Patient Details  Name: [name] MRN: [id number] Date of Birth: April 07, 1941 Referring Provider:  Sanjuana Kava, MD  Encounter Date: 05/31/2014      PT End of Session - 05/31/14 0847    Visit Number 12   Number of Visits 24   Date for PT Re-Evaluation 06/20/14   Authorization Type medicare   Authorization - Visit Number 12   Authorization - Number of Visits 24   PT Start Time 0803   PT Stop Time 6962   PT Time Calculation (min) 41 min   Activity Tolerance Patient limited by fatigue;Patient tolerated treatment well   Behavior During Therapy [facility] for tasks assessed/performed      Past Medical History  Diagnosis Date  . Subclavian steal syndrome   . Carotid bruit     LICA 95-28% (duplex 4/13)  . Hypertension   . Hyperlipidemia   . Atrial fibrillation     x 3 yrs  . Anticoagulation management encounter   . Meningioma   . Osteoporosis   . Stroke     CVA 2008  . Pneumonia   . Breast cancer     S/P left mastectomy, 1989 remained in remission  . Anemia     Past Surgical History  Procedure Laterality Date  . Cardiac catheterization    . Mastectomy Left 1990  . Cataract extraction Bilateral 2013  . Colonoscopy  11/17/2010    Procedure: COLONOSCOPY;  Surgeon: [name], MD;  [facility];  Service: Endoscopy;  Laterality: N/A;  10:45 am  . Esophagogastroduodenoscopy  11/17/2010    Procedure: ESOPHAGOGASTRODUODENOSCOPY (EGD);  Surgeon: [name], MD;  [facility];  Service: Endoscopy;  Laterality: N/A;  . Lumbar epidural injection  06-2012--06-2013    pt. states she has had 5 epidurals in 06-2012----06-2013  . Eye surgery      There were no vitals taken for this visit.  Visit Diagnosis:  Leg weakness, bilateral  Poor balance  Difficulty walking      Subjective  Assessment - 05/31/14 0806    Symptoms Patient states that she was in the hospital for 5 days with pneumonia and thinks that the symptoms had been worse before her hospital stay   Currently in Pain? Yes   Pain Score 8    Pain Location Back   Pain Orientation Lower                    [facility] Adult PT Treatment/Exercise - 05/31/14 0001    Lumbar Exercises: Stretches   Active Hamstring Stretch 3 reps;30 seconds   Active Hamstring Stretch Limitations 14 inch box   Passive Hamstring Stretch 3 reps;30 seconds   Passive Hamstring Stretch Limitations Gastroc stretch on slantboard   Single Knee to Chest Stretch 2 reps;30 seconds   Single Knee to Chest Stretch Limitations manual assist   Lower Trunk Rotation Other (comment)   Lower Trunk Rotation Limitations x10 with 10 second holds   Lumbar Exercises: Standing   Functional Squats 10 reps   Functional Squats Limitations Sit to stand from mat table   Other Standing Lumbar Exercises Heel to toe stance 2x10 seconds each side; side stepping x26ft with cues for posture and proper form; 3D hip excursions 2x5   Other Standing Lumbar Exercises Standing hip ABD 1x5 B HHA, difficulty with maintaining  good form and balance so switched to sidelying hip ABD;    Lumbar Exercises: Sidelying   Hip Abduction 10 reps   Hip Abduction Weights (lbs) 0   Hip Abduction Limitations manual assist for form; large amount difficulty with this exercise today due to gross weakness                  PT Short Term Goals - 05/19/14 0846    PT SHORT TERM GOAL #1   Title I in HEP   Time 1   Status Achieved   PT SHORT TERM GOAL #2   Title Pt to be able to stand for two minutes   Baseline able to stand for 75 seconds was unable    Time 4   Status On-going   PT SHORT TERM GOAL #3   Title Pt to be able to walk behind wheelchair for five minues   Baseline 90 seconds was 30 seconds    Time 4   Period Weeks   Status On-going   PT SHORT TERM GOAL #4    Title Pain level to be no greater than a 7/10   Baseline 1-/10 was 20/10           PT Long Term Goals - 05/07/14 4270    PT LONG TERM GOAL #1   Title I in advance HEP   Status On-going   PT LONG TERM GOAL #2   Title 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 Patient demonstrates general compensation patterns possibly secondary to general deconditioning and weakness in combination with aftereffects of recent bout of pneumonia. Continues to benefit from slower pacing of functional exercises and slower progression thereof.   Pt will benefit from skilled therapeutic intervention in order to improve on the following deficits Decreased activity tolerance;Decreased balance;Decreased coordination;Decreased mobility;Pain;Difficulty walking;Decreased strength   Rehab Potential Good   PT Frequency 3x / week   PT Duration 8 weeks   PT Next Visit Plan Continue with slower progression. Functional standing strengthening/balance tasks. Functional endurance.    Consulted and Agree with Plan of Care Patient        Problem List Patient Active Problem List   Diagnosis Date Noted  . Thrombocytosis 05/21/2014  . Protein-calorie malnutrition, severe 05/21/2014  . Anemia   . Generalized weakness   . Necrotic pneumonia 05/20/2014  . Pneumonia 05/20/2014  . Elevated liver enzymes 12/16/2013  . Tremor 06/24/2013  . Encounter for therapeutic drug monitoring 05/18/2013  . Dizziness 01/12/2013  . Wound  of ankle 10/23/2010  . Long term current use of anticoagulant 07/05/2010  . Occlusion and stenosis of carotid artery without mention of cerebral infarction 11/01/2009  . HYPERLIPIDEMIA 10/26/2009  . Essential hypertension 10/26/2009  . Atrial fibrillation 10/26/2009  . ACUTE ON CHRONIC DIASTOLIC HEART FAILURE 62/37/6283  . SYNCOPE AND COLLAPSE  10/26/2009    Deniece Ree PT, DPT [phone]  [facility] [address] [place], [place], [address] Phone: [phone]   Fax:  [phone]

## 2014-06-02 ENCOUNTER — Ambulatory Visit (HOSPITAL_COMMUNITY): Payer: Medicare Other | Admitting: Physical Therapy

## 2014-06-02 DIAGNOSIS — R278 Other lack of coordination: Secondary | ICD-10-CM | POA: Diagnosis not present

## 2014-06-02 DIAGNOSIS — R2689 Other abnormalities of gait and mobility: Secondary | ICD-10-CM

## 2014-06-02 DIAGNOSIS — R262 Difficulty in walking, not elsewhere classified: Secondary | ICD-10-CM

## 2014-06-02 DIAGNOSIS — R29898 Other symptoms and signs involving the musculoskeletal system: Secondary | ICD-10-CM

## 2014-06-02 DIAGNOSIS — M6281 Muscle weakness (generalized): Secondary | ICD-10-CM | POA: Diagnosis not present

## 2014-06-02 NOTE — Therapy (Signed)
[place], [place], [address] Phone: [phone]   Fax:  [phone]  Physical Therapy Treatment  Patient Details  Name: [name] MRN: [id number] Date of Birth: 24-Feb-1941 Referring Provider:  Sanjuana Kava, MD  Encounter Date: 06/02/2014      PT End of Session - 06/02/14 0841    Visit Number 13   Number of Visits 24   Date for PT Re-Evaluation 06/20/14   Authorization Type medicare   Authorization - Visit Number 13   Authorization - Number of Visits 24   PT Start Time 0803   PT Stop Time 3818   PT Time Calculation (min) 41 min   Activity Tolerance Patient limited by fatigue;Patient tolerated treatment well   Behavior During Therapy [facility] for tasks assessed/performed      Past Medical History  Diagnosis Date  . Subclavian steal syndrome   . Carotid bruit     LICA 29-93% (duplex 7/16)  . Hypertension   . Hyperlipidemia   . Atrial fibrillation     x 3 yrs  . Anticoagulation management encounter   . Meningioma   . Osteoporosis   . Stroke     CVA 2008  . Pneumonia   . Breast cancer     S/P left mastectomy, 1989 remained in remission  . Anemia     Past Surgical History  Procedure Laterality Date  . Cardiac catheterization    . Mastectomy Left 1990  . Cataract extraction Bilateral 2013  . Colonoscopy  11/17/2010    Procedure: COLONOSCOPY;  Surgeon: [name], MD;  [facility];  Service: Endoscopy;  Laterality: N/A;  10:45 am  . Esophagogastroduodenoscopy  11/17/2010    Procedure: ESOPHAGOGASTRODUODENOSCOPY (EGD);  Surgeon: [name], MD;  [facility];  Service: Endoscopy;  Laterality: N/A;  . Lumbar epidural injection  06-2012--06-2013    pt. states she has had 5 epidurals in 06-2012----06-2013  . Eye surgery      There were no vitals taken for this visit.  Visit Diagnosis:  Leg weakness, bilateral  Poor balance  Difficulty walking      Subjective  Assessment - 06/02/14 0804    Symptoms Patient states that she is miserable this morning, 10/10 pain but says that it is probably due to the weather since she always hurts more when its raining   Currently in Pain? Yes   Pain Score 10-Worst pain ever   Pain Location Back   Pain Orientation Lower                    [facility] Adult PT Treatment/Exercise - 06/02/14 0001    Lumbar Exercises: Stretches   Active Hamstring Stretch 3 reps;30 seconds   Active Hamstring Stretch Limitations 14 inch box   Passive Hamstring Stretch 3 reps;30 seconds   Passive Hamstring Stretch Limitations Gastroc stretch on slantboard   Single Knee to Chest Stretch 2 reps;30 seconds   Single Knee to Chest Stretch Limitations manual assist   Lower Trunk Rotation Other (comment)   Lower Trunk Rotation Limitations x10 with 10 second holds   Lumbar Exercises: Standing   Forward Lunge 10 reps   Forward Lunge Limitations short steps forward with cues for appropriate form and weight bearing through lead leg   Other Standing Lumbar Exercises 3D hip excursions 1x10; standing marches for 30 seconds; heel raises x50 seconds; mini-squats x40 seconds ; sit to stand x7 with HHA; forward and  sideways taps for challenging balance   Other Standing Lumbar Exercises Side stepping approx 21ft x2                  PT Short Term Goals - 05/19/14 0846    PT SHORT TERM GOAL #1   Title I in HEP   Time 1   Status Achieved   PT SHORT TERM GOAL #2   Title Pt to be able to stand for two minutes   Baseline able to stand for 75 seconds was unable    Time 4   Status On-going   PT SHORT TERM GOAL #3   Title Pt to be able to walk behind wheelchair for five minues   Baseline 90 seconds was 30 seconds    Time 4   Period Weeks   Status On-going   PT SHORT TERM GOAL #4   Title Pain level to be no greater than a 7/10   Baseline 1-/10 was 20/10           PT Long Term Goals - 05/07/14 9562    PT LONG TERM GOAL #1    Title I in advance HEP   Status On-going   PT LONG TERM GOAL #2   Title 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   Status On-going   PT LONG TERM GOAL #3   Title Pt to be able to walk in the house with a cane    Status On-going   PT LONG TERM GOAL #4   Title Pain level to be no greater than a 5/10 80% of the time    Status On-going               Plan - 06/02/14 0844    Clinical Impression Statement Patient continues to present with increased fatigue and requires frequent rest breaks during session. Patient states she is becoming frustrated again regarding her slow progress with skilled PT services; education provided regarding slow progression over time due to patient's poor activity tolerance. Focus today on standing functional strength and endurance tasks. Patient's back pain does appear to be alleviated by laying down and this was performed in between standing exercises.   Pt will benefit from skilled therapeutic intervention in order to improve on the following deficits Decreased activity tolerance;Decreased balance;Decreased coordination;Decreased mobility;Pain;Difficulty walking;Decreased strength   Rehab Potential Good   PT Frequency 3x / week   PT Duration 8 weeks   PT Treatment/Interventions ADLs/Self Care Home Management;Functional mobility training;Therapeutic activities;Therapeutic exercise;Balance training;Patient/family education   PT Next Visit Plan Continue with slower progression. Functional standing strengthening/balance tasks. Functional endurance. Treadmill or Nustep next session.    PT Home Exercise Plan given        Problem List Patient Active Problem List   Diagnosis Date Noted  . Thrombocytosis 05/21/2014  . Protein-calorie malnutrition, severe 05/21/2014  . Anemia   . Generalized weakness   . Necrotic pneumonia 05/20/2014  . Pneumonia 05/20/2014  . Elevated liver enzymes 12/16/2013  . Tremor 06/24/2013  . Encounter for  therapeutic drug monitoring 05/18/2013  . Dizziness 01/12/2013  . Wound of ankle 10/23/2010  . Long term current use of anticoagulant 07/05/2010  . Occlusion and stenosis of carotid artery without mention of cerebral infarction 11/01/2009  . HYPERLIPIDEMIA 10/26/2009  . Essential hypertension 10/26/2009  . Atrial fibrillation 10/26/2009  . ACUTE ON CHRONIC DIASTOLIC HEART FAILURE 13/11/6576  . SYNCOPE AND COLLAPSE 10/26/2009    Deniece Ree PT,  DPT [place], [place], [address] Phone: [phone]   Fax:  [phone]

## 2014-06-04 ENCOUNTER — Ambulatory Visit (HOSPITAL_COMMUNITY): Payer: Medicare Other | Admitting: Physical Therapy

## 2014-06-04 DIAGNOSIS — R2689 Other abnormalities of gait and mobility: Secondary | ICD-10-CM

## 2014-06-04 DIAGNOSIS — M6281 Muscle weakness (generalized): Secondary | ICD-10-CM | POA: Diagnosis not present

## 2014-06-04 DIAGNOSIS — R262 Difficulty in walking, not elsewhere classified: Secondary | ICD-10-CM | POA: Diagnosis not present

## 2014-06-04 DIAGNOSIS — S9422XA Injury of deep peroneal nerve at ankle and foot level, left leg, initial encounter: Secondary | ICD-10-CM | POA: Diagnosis not present

## 2014-06-04 DIAGNOSIS — S22089D Unspecified fracture of T11-T12 vertebra, subsequent encounter for fracture with routine healing: Secondary | ICD-10-CM | POA: Diagnosis not present

## 2014-06-04 DIAGNOSIS — R278 Other lack of coordination: Secondary | ICD-10-CM | POA: Diagnosis not present

## 2014-06-04 DIAGNOSIS — R29898 Other symptoms and signs involving the musculoskeletal system: Secondary | ICD-10-CM

## 2014-06-04 DIAGNOSIS — I10 Essential (primary) hypertension: Secondary | ICD-10-CM | POA: Diagnosis not present

## 2014-06-04 NOTE — Therapy (Signed)
[place], [place], [address] Phone: [phone]   Fax:  [phone]  Physical Therapy Treatment  Patient Details  Name: [name] MRN: [id number] Date of Birth: 1941/03/23 Referring Provider:  Sanjuana Kava, MD  Encounter Date: 06/04/2014      PT End of Session - 06/04/14 1147    Visit Number 14   Number of Visits 24   Date for PT Re-Evaluation 06/20/14   Authorization Type medicare   Authorization - Visit Number 14   Authorization - Number of Visits 24   Activity Tolerance Patient tolerated treatment well;Patient limited by fatigue   Behavior During Therapy [facility] for tasks assessed/performed      Past Medical History  Diagnosis Date  . Subclavian steal syndrome   . Carotid bruit     LICA 67-59% (duplex 1/63)  . Hypertension   . Hyperlipidemia   . Atrial fibrillation     x 3 yrs  . Anticoagulation management encounter   . Meningioma   . Osteoporosis   . Stroke     CVA 2008  . Pneumonia   . Breast cancer     S/P left mastectomy, 1989 remained in remission  . Anemia     Past Surgical History  Procedure Laterality Date  . Cardiac catheterization    . Mastectomy Left 1990  . Cataract extraction Bilateral 2013  . Colonoscopy  11/17/2010    Procedure: COLONOSCOPY;  Surgeon: [name], MD;  [facility];  Service: Endoscopy;  Laterality: N/A;  10:45 am  . Esophagogastroduodenoscopy  11/17/2010    Procedure: ESOPHAGOGASTRODUODENOSCOPY (EGD);  Surgeon: [name], MD;  [facility];  Service: Endoscopy;  Laterality: N/A;  . Lumbar epidural injection  06-2012--06-2013    pt. states she has had 5 epidurals in 06-2012----06-2013  . Eye surgery      There were no vitals taken for this visit.  Visit Diagnosis:  Leg weakness, bilateral  Poor balance  Difficulty walking      Subjective Assessment - 06/04/14 1147    Symptoms Patient states that her pain is not bad  this morning, maybe 8/10   Pertinent History Pt states that she was on her back for about six weeks and has lost a lot of her strength.           [facility] PT Assessment - 06/04/14 0001    Observation/Other Assessments   Observations FABER positive L hip, grind test negative L hip                  [facility] Adult PT Treatment/Exercise - 06/04/14 0001    Ambulation/Gait   Gait Comments Gait x 39ft, 29ft, 43ft, 54ft; noted reduced dorsiflexion both sides but most significantly on L, reduced TKE bilaterally, reduced pelvic rotation, reduced gait speed, flexed trunk   Lumbar Exercises: Stretches   Active Hamstring Stretch 3 reps;30 seconds   Active Hamstring Stretch Limitations 14 inch box   Passive Hamstring Stretch 2 reps;30 seconds   Passive Hamstring Stretch Limitations Gastroc stretch on slantboard   Lumbar Exercises: Standing   Functional Squats 10 reps   Functional Squats Limitations Staggered stance, x5 each foot forward   Other Standing Lumbar Exercises Standing marches x60 seconds; hip ABD x60 seconds   Other Standing Lumbar Exercises Trunk rotation in standing with facilitation of pelvic facilitation from therapist to improve gait mechanics   Lumbar Exercises: Supine   Bridge 10 reps;Other (comment)  Bridge Limitations Standard bridges; sustained bridge dropping hip down on either side; down-ups with pelvis without rear touching mat table                PT Education - 06/04/14 1147    Education provided Yes   Education Details Education regarding AFO that MD had prescribed   Person(s) Educated Patient   Methods Explanation   Comprehension Verbalized understanding          PT Short Term Goals - 05/19/14 0846    PT SHORT TERM GOAL #1   Title I in HEP   Time 1   Status Achieved   PT SHORT TERM GOAL #2   Title Pt to be able to stand for two minutes   Baseline able to stand for 75 seconds was unable    Time 4   Status On-going   PT SHORT TERM GOAL #3    Title Pt to be able to walk behind wheelchair for five minues   Baseline 90 seconds was 30 seconds    Time 4   Period Weeks   Status On-going   PT SHORT TERM GOAL #4   Title Pain level to be no greater than a 7/10   Baseline 1-/10 was 20/10           PT Long Term Goals - 05/07/14 4742    PT LONG TERM GOAL #1   Title I in advance HEP   Status On-going   PT LONG TERM GOAL #2   Title 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 Continued functional strengthening in supine and standing with increased tolerance of standing tasks. Performed gait training and noted significant impairments including reduced TKE bilaterally, reduced pelvic rotation, reduced dorsiflexion with L side greater than R, reduced step length, and reduced gait speed, flexed trunk. Pt c/o transient L hip pain; FABER test positive L and grind test negative L. L ankle only able to actively come through partial range of dorsiflexion in gravity eleiminated position, muscle contraction present in gravity present but untable to efficiently dorsiflex. Difficulty with pelvic rotation during gait even with therapist facilitation.   Pt will benefit from skilled therapeutic intervention in order to improve on the following deficits Decreased activity tolerance;Decreased balance;Decreased coordination;Decreased mobility;Pain;Difficulty walking;Decreased strength   Rehab Potential Good   PT Frequency 3x / week   PT Duration 8 weeks   PT Treatment/Interventions ADLs/Self Care Home  Management;Functional mobility training;Therapeutic activities;Therapeutic exercise;Balance training;Patient/family education   PT Next Visit Plan Continue with slower progression. Functional standing strengthening/balance tasks. Functional endurance. Treadmill or Nustep next session. More special tests for hip to assist in ID-ing source of pain. Work on pelvic rotation in standing.    PT Home Exercise Plan given   Consulted and Agree with Plan of Care Patient  Problem List Patient Active Problem List   Diagnosis Date Noted  . Thrombocytosis 05/21/2014  . Protein-calorie malnutrition, severe 05/21/2014  . Anemia   . Generalized weakness   . Necrotic pneumonia 05/20/2014  . Pneumonia 05/20/2014  . Elevated liver enzymes 12/16/2013  . Tremor 06/24/2013  . Encounter for therapeutic drug monitoring 05/18/2013  . Dizziness 01/12/2013  . Wound of ankle 10/23/2010  . Long term current use of anticoagulant 07/05/2010  . Occlusion and stenosis of carotid artery without mention of cerebral infarction 11/01/2009  . HYPERLIPIDEMIA 10/26/2009  . Essential hypertension 10/26/2009  . Atrial fibrillation 10/26/2009  . ACUTE ON CHRONIC DIASTOLIC HEART FAILURE 55/21/7471  . SYNCOPE AND COLLAPSE 10/26/2009    Deniece Ree PT, DPT [place] [address] [place], [place], [address] Phone: [phone]   Fax:  [phone]

## 2014-06-07 ENCOUNTER — Ambulatory Visit (INDEPENDENT_AMBULATORY_CARE_PROVIDER_SITE_OTHER): Payer: Medicare Other | Admitting: *Deleted

## 2014-06-07 ENCOUNTER — Ambulatory Visit (HOSPITAL_COMMUNITY): Payer: Medicare Other | Admitting: Physical Therapy

## 2014-06-07 DIAGNOSIS — R29898 Other symptoms and signs involving the musculoskeletal system: Secondary | ICD-10-CM

## 2014-06-07 DIAGNOSIS — Z5181 Encounter for therapeutic drug level monitoring: Secondary | ICD-10-CM | POA: Diagnosis not present

## 2014-06-07 DIAGNOSIS — R278 Other lack of coordination: Secondary | ICD-10-CM | POA: Diagnosis not present

## 2014-06-07 DIAGNOSIS — I4891 Unspecified atrial fibrillation: Secondary | ICD-10-CM | POA: Diagnosis not present

## 2014-06-07 DIAGNOSIS — R262 Difficulty in walking, not elsewhere classified: Secondary | ICD-10-CM | POA: Diagnosis not present

## 2014-06-07 DIAGNOSIS — R2689 Other abnormalities of gait and mobility: Secondary | ICD-10-CM

## 2014-06-07 DIAGNOSIS — I480 Paroxysmal atrial fibrillation: Secondary | ICD-10-CM

## 2014-06-07 DIAGNOSIS — M6281 Muscle weakness (generalized): Secondary | ICD-10-CM | POA: Diagnosis not present

## 2014-06-07 LAB — POCT INR: INR: 1.7

## 2014-06-07 NOTE — Therapy (Deleted)
[place], [place], [address] Phone: [phone]   Fax:  [phone]  Physical Therapy Treatment  Patient Details  Name: [name] MRN: [id number] Date of Birth: [birth year] Referring Provider:  Sanjuana Kava, MD  Encounter Date: 06/07/2014      PT End of Session - 06/07/14 0848    Visit Number 15   Number of Visits 24   Date for PT Re-Evaluation 06/20/14   Authorization Type medicare   Authorization - Visit Number 15   Authorization - Number of Visits 24   PT Start Time 0801   PT Stop Time 3474   PT Time Calculation (min) 43 min   Activity Tolerance Patient limited by fatigue;Patient limited by pain   Behavior During Therapy [facility] for tasks assessed/performed      Past Medical History  Diagnosis Date  . Subclavian steal syndrome   . Carotid bruit     LICA 25-95% (duplex 6/38)  . Hypertension   . Hyperlipidemia   . Atrial fibrillation     x 3 yrs  . Anticoagulation management encounter   . Meningioma   . Osteoporosis   . Stroke     CVA 2008  . Pneumonia   . Breast cancer     S/P left mastectomy, 1989 remained in remission  . Anemia     Past Surgical History  Procedure Laterality Date  . Cardiac catheterization    . Mastectomy Left 1990  . Cataract extraction Bilateral 2013  . Colonoscopy  11/17/2010    Procedure: COLONOSCOPY;  Surgeon: [name], MD;  [facility];  Service: Endoscopy;  Laterality: N/A;  10:45 am  . Esophagogastroduodenoscopy  11/17/2010    Procedure: ESOPHAGOGASTRODUODENOSCOPY (EGD);  Surgeon: [name], MD;  [facility];  Service: Endoscopy;  Laterality: N/A;  . Lumbar epidural injection  06-2012--06-2013    pt. states she has had 5 epidurals in 06-2012----06-2013  . Eye surgery      There were no vitals taken for this visit.  Visit Diagnosis:  Leg weakness, bilateral  Poor balance  Difficulty walking      Subjective  Assessment - 06/07/14 0802    Symptoms Patient states that she got her brace this weekend, is wearing it now; legs are "killing" her this morning. States that she did not do her exercises this weekend and was more active and out and about than usual.    Currently in Pain? Yes   Pain Score 10-Worst pain ever  muscle soreness in general area of quads bilaterally; back about 8/10   Pain Location Leg  bilateral legs and back   Pain Orientation --  both legs and back   Pain Descriptors / Indicators Constant                    [facility] Adult PT Treatment/Exercise - 06/07/14 0001    Lumbar Exercises: Stretches   Active Hamstring Stretch 3 reps;30 seconds   Active Hamstring Stretch Limitations 14 inch box   Passive Hamstring Stretch 2 reps;30 seconds   Passive Hamstring Stretch Limitations Gastroc stretch   Lower Trunk Rotation Other (comment)   Lower Trunk Rotation Limitations x10 with 10 second holds   Piriformis Stretch 2 reps;30 seconds   Piriformis Stretch Limitations supine   Lumbar Exercises: Standing   Other Standing Lumbar Exercises Standing marches x70 seconds; standing hip ABD x70 seconds; step ups on 4 inch steps x45 seconds  Other Standing Lumbar Exercises Facilitation of trunk rotation and increased step length; Mod facilitation and cues   Lumbar Exercises: Seated   Other Seated Lumbar Exercises Pushing WC backwards against resistance of sports cord 70ftx 2   Lumbar Exercises: Supine   Bridge 15 reps   Bridge Limitations Standard bridges;    Straight Leg Raise 15 reps   Other Supine Lumbar Exercises Hip ABD 1x15 bilaterally; attempted in sidelying but patient unable to perform correctly                 PT Education - 06/07/14 0821    Education provided Yes   Education Details Education regarding skin inspections in area of AFO to identify and prevent skin breakdown   Person(s) Educated Patient   Methods Explanation   Comprehension Verbalized  understanding          PT Short Term Goals - 05/19/14 0846    PT SHORT TERM GOAL #1   Title I in HEP   Time 1   Status Achieved   PT SHORT TERM GOAL #2   Title Pt to be able to stand for two minutes   Baseline able to stand for 75 seconds was unable    Time 4   Status On-going   PT SHORT TERM GOAL #3   Title Pt to be able to walk behind wheelchair for five minues   Baseline 90 seconds was 30 seconds    Time 4   Period Weeks   Status On-going   PT SHORT TERM GOAL #4   Title Pain level to be no greater than a 7/10   Baseline 1-/10 was 20/10           PT Long Term Goals - 05/07/14 6644    PT LONG TERM GOAL #1   Title I in advance HEP   Status On-going   PT LONG TERM GOAL #2   Title 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 Cues provided however patient continued to be limited by fatigue throughout entire session. Treatment adjusted to patient status on this date and activities modified as necessary. Slight wet sounding cough noted. Practice with pelvic rotation in static stance and gait.    Pt will benefit from skilled therapeutic intervention in order to improve on the following deficits Decreased activity tolerance;Decreased balance;Decreased coordination;Decreased mobility;Pain;Difficulty walking;Decreased strength   Rehab Potential Good   PT Frequency 3x / week   PT Duration 8 weeks   PT Treatment/Interventions ADLs/Self Care Home  Management;Functional mobility training;Therapeutic activities;Therapeutic exercise;Balance training;Patient/family education   PT Next Visit Plan Continue with slower progression. Functional standing strengthening/balance tasks. Functional endurance. Treadmill or Nustep next session. More special tests for hip to assist in ID-ing source of pain. Work on pelvic rotation in standing. Contniue with resisted WC  endurance/strength exercise.         Problem List Patient Active Problem List   Diagnosis Date Noted  . Thrombocytosis 05/21/2014  . Protein-calorie malnutrition, severe 05/21/2014  . Anemia   . Generalized weakness   . Necrotic pneumonia 05/20/2014  . Pneumonia 05/20/2014  . Elevated liver enzymes 12/16/2013  . Tremor 06/24/2013  . Encounter for therapeutic drug monitoring 05/18/2013  . Dizziness 01/12/2013  . Wound of ankle 10/23/2010  . Long term current use of anticoagulant 07/05/2010  . Occlusion and stenosis of carotid artery without mention of cerebral infarction 11/01/2009  . HYPERLIPIDEMIA 10/26/2009  . Essential hypertension 10/26/2009  . Atrial fibrillation 10/26/2009  . ACUTE ON CHRONIC DIASTOLIC HEART FAILURE 48/59/2763  . SYNCOPE AND COLLAPSE 10/26/2009    Deniece Ree PT, DPT [place] [address] [place], [place], [address] Phone: [phone]   Fax:  [phone]

## 2014-06-07 NOTE — Therapy (Signed)
[place], [place], [address] Phone: [phone]   Fax:  [phone]  Physical Therapy Treatment  Patient Details  Name: [name] MRN: [id number] Date of Birth: 17-Aug-1940 Referring Provider:  Sanjuana Kava, MD  Encounter Date: 06/07/2014      PT End of Session - 06/07/14 0848    Visit Number 15   Number of Visits 24   Date for PT Re-Evaluation 06/20/14   Authorization Type medicare   Authorization - Visit Number 15   Authorization - Number of Visits 24   PT Start Time 0801   PT Stop Time 3536   PT Time Calculation (min) 43 min   Activity Tolerance Patient limited by fatigue;Patient limited by pain   Behavior During Therapy [facility] for tasks assessed/performed      Past Medical History  Diagnosis Date  . Subclavian steal syndrome   . Carotid bruit     LICA 14-43% (duplex 1/54)  . Hypertension   . Hyperlipidemia   . Atrial fibrillation     x 3 yrs  . Anticoagulation management encounter   . Meningioma   . Osteoporosis   . Stroke     CVA 2008  . Pneumonia   . Breast cancer     S/P left mastectomy, 1989 remained in remission  . Anemia     Past Surgical History  Procedure Laterality Date  . Cardiac catheterization    . Mastectomy Left 1990  . Cataract extraction Bilateral 2013  . Colonoscopy  11/17/2010    Procedure: COLONOSCOPY;  Surgeon: [name], MD;  [facility];  Service: Endoscopy;  Laterality: N/A;  10:45 am  . Esophagogastroduodenoscopy  11/17/2010    Procedure: ESOPHAGOGASTRODUODENOSCOPY (EGD);  Surgeon: [name], MD;  [facility];  Service: Endoscopy;  Laterality: N/A;  . Lumbar epidural injection  06-2012--06-2013    pt. states she has had 5 epidurals in 06-2012----06-2013  . Eye surgery      There were no vitals taken for this visit.  Visit Diagnosis:  Leg weakness, bilateral  Poor balance  Difficulty walking      Subjective  Assessment - 06/07/14 0802    Symptoms Patient states that she got her brace this weekend, is wearing it now; legs are "killing" her this morning. States that she did not do her exercises this weekend and was more active and out and about than usual.    Currently in Pain? Yes   Pain Score 10-Worst pain ever  muscle soreness in general area of quads bilaterally; back about 8/10   Pain Location Leg  bilateral legs and back   Pain Orientation --  both legs and back   Pain Descriptors / Indicators Constant                    [facility] Adult PT Treatment/Exercise - 06/07/14 0001    Lumbar Exercises: Stretches   Active Hamstring Stretch 3 reps;30 seconds   Active Hamstring Stretch Limitations 14 inch box   Passive Hamstring Stretch 2 reps;30 seconds   Passive Hamstring Stretch Limitations Gastroc stretch   Lower Trunk Rotation Other (comment)   Lower Trunk Rotation Limitations x10 with 10 second holds   Piriformis Stretch 2 reps;30 seconds   Piriformis Stretch Limitations supine   Lumbar Exercises: Standing   Other Standing Lumbar Exercises Standing marches x70 seconds; standing hip ABD x70 seconds; step ups on 4 inch steps x45 seconds  Other Standing Lumbar Exercises Facilitation of trunk rotation and increased step length; Mod facilitation and cues   Lumbar Exercises: Seated   Other Seated Lumbar Exercises Pushing WC backwards against resistance of sports cord 25ftx 2   Lumbar Exercises: Supine   Bridge 15 reps   Bridge Limitations Standard bridges;    Straight Leg Raise 15 reps   Other Supine Lumbar Exercises Hip ABD 1x15 bilaterally; attempted in sidelying but patient unable to perform correctly                 PT Education - 06/07/14 0821    Education provided Yes   Education Details Education regarding skin inspections in area of AFO to identify and prevent skin breakdown   Person(s) Educated Patient   Methods Explanation   Comprehension Verbalized  understanding          PT Short Term Goals - 05/19/14 0846    PT SHORT TERM GOAL #1   Title I in HEP   Time 1   Status Achieved   PT SHORT TERM GOAL #2   Title Pt to be able to stand for two minutes   Baseline able to stand for 75 seconds was unable    Time 4   Status On-going   PT SHORT TERM GOAL #3   Title Pt to be able to walk behind wheelchair for five minues   Baseline 90 seconds was 30 seconds    Time 4   Period Weeks   Status On-going   PT SHORT TERM GOAL #4   Title Pain level to be no greater than a 7/10   Baseline 1-/10 was 20/10           PT Long Term Goals - 05/07/14 6270    PT LONG TERM GOAL #1   Title I in advance HEP   Status On-going   PT LONG TERM GOAL #2   Title 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 Cues provided however patient continued to be limited by fatigue throughout entire session. Treatment adjusted to patient status on this date and activities modified as necessary. Slight wet sounding cough noted. Practice with pelvic rotation in static stance and gait.    Pt will benefit from skilled therapeutic intervention in order to improve on the following deficits Decreased activity tolerance;Decreased balance;Decreased coordination;Decreased mobility;Pain;Difficulty walking;Decreased strength   Rehab Potential Good   PT Frequency 3x / week   PT Duration 8 weeks   PT Treatment/Interventions ADLs/Self Care Home  Management;Functional mobility training;Therapeutic activities;Therapeutic exercise;Balance training;Patient/family education   PT Next Visit Plan Continue with slower progression. Functional standing strengthening/balance tasks. Functional endurance. Treadmill or Nustep next session. More special tests for hip to assist in ID-ing source of pain. Work on pelvic rotation in standing. Contniue with resisted WC  endurance/strength exercise.         Problem List Patient Active Problem List   Diagnosis Date Noted  . Thrombocytosis 05/21/2014  . Protein-calorie malnutrition, severe 05/21/2014  . Anemia   . Generalized weakness   . Necrotic pneumonia 05/20/2014  . Pneumonia 05/20/2014  . Elevated liver enzymes 12/16/2013  . Tremor 06/24/2013  . Encounter for therapeutic drug monitoring 05/18/2013  . Dizziness 01/12/2013  . Wound of ankle 10/23/2010  . Long term current use of anticoagulant 07/05/2010  . Occlusion and stenosis of carotid artery without mention of cerebral infarction 11/01/2009  . HYPERLIPIDEMIA 10/26/2009  . Essential hypertension 10/26/2009  . Atrial fibrillation 10/26/2009  . ACUTE ON CHRONIC DIASTOLIC HEART FAILURE 09/81/1914  . SYNCOPE AND COLLAPSE 10/26/2009    Hunt Oris 06/07/2014, 10:08 AM  [place], [place], [address] Phone: [phone]   Fax:  [phone]

## 2014-06-07 NOTE — Therapy (Signed)
[place], [place], [address] Phone: [phone]   Fax:  [phone]  Physical Therapy Treatment  Patient Details  Name: [name] MRN: [id number] Date of Birth: 30-Dec-1940 Referring Provider:  Sanjuana Kava, MD  Encounter Date: 06/07/2014      PT End of Session - 06/07/14 0848    Visit Number 15   Number of Visits 24   Date for PT Re-Evaluation 06/20/14   Authorization Type medicare   Authorization - Visit Number 15   Authorization - Number of Visits 24   PT Start Time 0801   PT Stop Time 7616   PT Time Calculation (min) 43 min   Activity Tolerance Patient limited by fatigue;Patient limited by pain   Behavior During Therapy [facility] for tasks assessed/performed      Past Medical History  Diagnosis Date  . Subclavian steal syndrome   . Carotid bruit     LICA 07-37% (duplex 1/06)  . Hypertension   . Hyperlipidemia   . Atrial fibrillation     x 3 yrs  . Anticoagulation management encounter   . Meningioma   . Osteoporosis   . Stroke     CVA 2008  . Pneumonia   . Breast cancer     S/P left mastectomy, 1989 remained in remission  . Anemia     Past Surgical History  Procedure Laterality Date  . Cardiac catheterization    . Mastectomy Left 1990  . Cataract extraction Bilateral 2013  . Colonoscopy  11/17/2010    Procedure: COLONOSCOPY;  Surgeon: [name], MD;  [facility];  Service: Endoscopy;  Laterality: N/A;  10:45 am  . Esophagogastroduodenoscopy  11/17/2010    Procedure: ESOPHAGOGASTRODUODENOSCOPY (EGD);  Surgeon: [name], MD;  [facility];  Service: Endoscopy;  Laterality: N/A;  . Lumbar epidural injection  06-2012--06-2013    pt. states she has had 5 epidurals in 06-2012----06-2013  . Eye surgery      There were no vitals taken for this visit.  Visit Diagnosis:  Leg weakness, bilateral  Poor balance  Difficulty walking      Subjective  Assessment - 06/07/14 0802    Symptoms Patient states that she got her brace this weekend, is wearing it now; legs are "killing" her this morning. States that she did not do her exercises this weekend and was more active and out and about than usual.    Currently in Pain? Yes   Pain Score 10-Worst pain ever  muscle soreness in general area of quads bilaterally; back about 8/10   Pain Location Leg  bilateral legs and back   Pain Orientation --  both legs and back   Pain Descriptors / Indicators Constant                    [facility] Adult PT Treatment/Exercise - 06/07/14 0001    Lumbar Exercises: Stretches   Active Hamstring Stretch 3 reps;30 seconds   Active Hamstring Stretch Limitations 14 inch box   Passive Hamstring Stretch 2 reps;30 seconds   Passive Hamstring Stretch Limitations Gastroc stretch   Lower Trunk Rotation Other (comment)   Lower Trunk Rotation Limitations x10 with 10 second holds   Piriformis Stretch 2 reps;30 seconds   Piriformis Stretch Limitations supine   Lumbar Exercises: Standing   Other Standing Lumbar Exercises Standing marches x70 seconds; standing hip ABD x70 seconds; step ups on 4 inch steps x45 seconds  Other Standing Lumbar Exercises Facilitation of trunk rotation and increased step length; Mod facilitation and cues   Lumbar Exercises: Seated   Other Seated Lumbar Exercises Pushing WC backwards against resistance of sports cord 18ftx 2   Lumbar Exercises: Supine   Bridge 15 reps   Bridge Limitations Standard bridges;    Straight Leg Raise 15 reps   Other Supine Lumbar Exercises Hip ABD 1x15 bilaterally; attempted in sidelying but patient unable to perform correctly                 PT Education - 06/07/14 0821    Education provided Yes   Education Details Education regarding skin inspections in area of AFO to identify and prevent skin breakdown   Person(s) Educated Patient   Methods Explanation   Comprehension Verbalized  understanding          PT Short Term Goals - 05/19/14 0846    PT SHORT TERM GOAL #1   Title I in HEP   Time 1   Status Achieved   PT SHORT TERM GOAL #2   Title Pt to be able to stand for two minutes   Baseline able to stand for 75 seconds was unable    Time 4   Status On-going   PT SHORT TERM GOAL #3   Title Pt to be able to walk behind wheelchair for five minues   Baseline 90 seconds was 30 seconds    Time 4   Period Weeks   Status On-going   PT SHORT TERM GOAL #4   Title Pain level to be no greater than a 7/10   Baseline 1-/10 was 20/10           PT Long Term Goals - 05/07/14 7353    PT LONG TERM GOAL #1   Title I in advance HEP   Status On-going   PT LONG TERM GOAL #2   Title 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 Cues provided however patient continued to be limited by fatigue throughout entire session. Treatment adjusted to patient status on this date and activities modified as necessary. Slight wet sounding cough noted. Practice with pelvic rotation in static stance and gait.    Pt will benefit from skilled therapeutic intervention in order to improve on the following deficits Decreased activity tolerance;Decreased balance;Decreased coordination;Decreased mobility;Pain;Difficulty walking;Decreased strength   Rehab Potential Good   PT Frequency 3x / week   PT Duration 8 weeks   PT Treatment/Interventions ADLs/Self Care Home  Management;Functional mobility training;Therapeutic activities;Therapeutic exercise;Balance training;Patient/family education   PT Next Visit Plan Continue with slower progression. Functional standing strengthening/balance tasks. Functional endurance. Treadmill or Nustep next session. More special tests for hip to assist in ID-ing source of pain. Work on pelvic rotation in standing. Contniue with resisted WC  endurance/strength exercise.         Problem List Patient Active Problem List   Diagnosis Date Noted  . Thrombocytosis 05/21/2014  . Protein-calorie malnutrition, severe 05/21/2014  . Anemia   . Generalized weakness   . Necrotic pneumonia 05/20/2014  . Pneumonia 05/20/2014  . Elevated liver enzymes 12/16/2013  . Tremor 06/24/2013  . Encounter for therapeutic drug monitoring 05/18/2013  . Dizziness 01/12/2013  . Wound of ankle 10/23/2010  . Long term current use of anticoagulant 07/05/2010  . Occlusion and stenosis of carotid artery without mention of cerebral infarction 11/01/2009  . HYPERLIPIDEMIA 10/26/2009  . Essential hypertension 10/26/2009  . Atrial fibrillation 10/26/2009  . ACUTE ON CHRONIC DIASTOLIC HEART FAILURE 52/77/8242  . SYNCOPE AND COLLAPSE 10/26/2009    Hunt Oris 06/07/2014, 10:09 AM  [place], [place], [address] Phone: [phone]   Fax:  [phone]

## 2014-06-08 DIAGNOSIS — D594 Other nonautoimmune hemolytic anemias: Secondary | ICD-10-CM | POA: Diagnosis not present

## 2014-06-08 DIAGNOSIS — D649 Anemia, unspecified: Secondary | ICD-10-CM | POA: Diagnosis not present

## 2014-06-08 DIAGNOSIS — I482 Chronic atrial fibrillation: Secondary | ICD-10-CM | POA: Diagnosis not present

## 2014-06-08 DIAGNOSIS — R062 Wheezing: Secondary | ICD-10-CM | POA: Diagnosis not present

## 2014-06-08 DIAGNOSIS — J189 Pneumonia, unspecified organism: Secondary | ICD-10-CM | POA: Diagnosis not present

## 2014-06-09 ENCOUNTER — Other Ambulatory Visit (HOSPITAL_COMMUNITY): Payer: Self-pay | Admitting: Internal Medicine

## 2014-06-09 ENCOUNTER — Ambulatory Visit (HOSPITAL_COMMUNITY): Payer: Medicare Other | Attending: Orthopaedic Surgery | Admitting: Physical Therapy

## 2014-06-09 DIAGNOSIS — M6281 Muscle weakness (generalized): Secondary | ICD-10-CM | POA: Diagnosis not present

## 2014-06-09 DIAGNOSIS — R29898 Other symptoms and signs involving the musculoskeletal system: Secondary | ICD-10-CM

## 2014-06-09 DIAGNOSIS — R278 Other lack of coordination: Secondary | ICD-10-CM | POA: Diagnosis not present

## 2014-06-09 DIAGNOSIS — Z1231 Encounter for screening mammogram for malignant neoplasm of breast: Secondary | ICD-10-CM

## 2014-06-09 DIAGNOSIS — R262 Difficulty in walking, not elsewhere classified: Secondary | ICD-10-CM | POA: Insufficient documentation

## 2014-06-09 DIAGNOSIS — J189 Pneumonia, unspecified organism: Secondary | ICD-10-CM

## 2014-06-09 DIAGNOSIS — R2689 Other abnormalities of gait and mobility: Secondary | ICD-10-CM

## 2014-06-09 NOTE — Therapy (Signed)
[place], [place], [address] Phone: [phone]   Fax:  [phone]  Physical Therapy Treatment  Patient Details  Name: [name] MRN: [id number] Date of Birth: 1940/06/30 Referring Provider:  Sanjuana Kava, MD  Encounter Date: 06/09/2014      PT End of Session - 06/09/14 0853    Visit Number 16   Number of Visits 24   Date for PT Re-Evaluation 06/20/14   Authorization Type medicare   Authorization - Visit Number 16   Authorization - Number of Visits 24   PT Start Time 0805   PT Stop Time 0845   PT Time Calculation (min) 40 min   Activity Tolerance Patient limited by fatigue;Patient limited by pain   Behavior During Therapy [facility] for tasks assessed/performed      Past Medical History  Diagnosis Date  . Subclavian steal syndrome   . Carotid bruit     LICA 53-29% (duplex 9/24)  . Hypertension   . Hyperlipidemia   . Atrial fibrillation     x 3 yrs  . Anticoagulation management encounter   . Meningioma   . Osteoporosis   . Stroke     CVA 2008  . Pneumonia   . Breast cancer     S/P left mastectomy, 1989 remained in remission  . Anemia     Past Surgical History  Procedure Laterality Date  . Cardiac catheterization    . Mastectomy Left 1990  . Cataract extraction Bilateral 2013  . Colonoscopy  11/17/2010    Procedure: COLONOSCOPY;  Surgeon: [name], MD;  [facility];  Service: Endoscopy;  Laterality: N/A;  10:45 am  . Esophagogastroduodenoscopy  11/17/2010    Procedure: ESOPHAGOGASTRODUODENOSCOPY (EGD);  Surgeon: [name], MD;  [facility];  Service: Endoscopy;  Laterality: N/A;  . Lumbar epidural injection  06-2012--06-2013    pt. states she has had 5 epidurals in 06-2012----06-2013  . Eye surgery      There were no vitals taken for this visit.  Visit Diagnosis:  Leg weakness, bilateral  Poor balance  Difficulty walking      Subjective Assessment  - 06/09/14 0928    Symptoms Pt states she's trying to walk around her house some pushing her wheelchair.  States her legs always feel better after therapy.  No reports of pain this morning.                    [facility] Adult PT Treatment/Exercise - 06/09/14 0853    Lumbar Exercises: Stretches   Active Hamstring Stretch 3 reps;30 seconds   Active Hamstring Stretch Limitations 14 inch box   Passive Hamstring Stretch 2 reps;30 seconds   Passive Hamstring Stretch Limitations Gastroc stretch   Lower Trunk Rotation Other (comment)   Lower Trunk Rotation Limitations x10 with 10 second holds   Piriformis Stretch 2 reps;30 seconds   Piriformis Stretch Limitations supine   Lumbar Exercises: Standing   Other Standing Lumbar Exercises Standing marches x70 seconds; standing hip ABD x70 seconds; lateral step ups on 4 inch steps x10 each    Other Standing Lumbar Exercises gait x100 feet pushing WC   Lumbar Exercises: Supine   Bridge 15 reps   Bridge Limitations Standard bridges;    Straight Leg Raise 15 reps   Other Supine Lumbar Exercises Hip ABD 2x10 bilaterally; attempted in sidelying but patient unable to perform correctly    Lumbar Exercises: Sidelying  Clam 15 reps                  PT Short Term Goals - 05/19/14 0846    PT SHORT TERM GOAL #1   Title I in HEP   Time 1   Status Achieved   PT SHORT TERM GOAL #2   Title Pt to be able to stand for two minutes   Baseline able to stand for 75 seconds was unable    Time 4   Status On-going   PT SHORT TERM GOAL #3   Title Pt to be able to walk behind wheelchair for five minues   Baseline 90 seconds was 30 seconds    Time 4   Period Weeks   Status On-going   PT SHORT TERM GOAL #4   Title Pain level to be no greater than a 7/10   Baseline 1-/10 was 20/10           PT Long Term Goals - 05/07/14 3710    PT LONG TERM GOAL #1   Title I in advance HEP   Status On-going   PT LONG TERM GOAL #2   Title Pt to be able 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  Less muscle shaking during end of reps noted.  Cues to keep feet in neutral with supine hip abduction and added side lying clams to further increase hip abductor strength. No longer with wet sounding cough present.  Able to complete 100 feet ambulating pushing wheelchair before becoming fatiqued.   PT Next Visit Plan Continue with slower progression. Functional standing strengthening/balance tasks. Functional endurance. Treadmill or Nustep next session. More special tests for hip to assist in ID-ing source of pain. Work on pelvic rotation in standing. Contniue with resisted WC endurance/strength exercise.         Problem List Patient Active Problem List   Diagnosis Date Noted  . HYPERLIPIDEMIA 10/26/2009    Priority: Medium  . Essential hypertension 10/26/2009    Priority: Low  . Thrombocytosis 05/21/2014  . Protein-calorie malnutrition, severe 05/21/2014  . Anemia   . Generalized weakness   . Necrotic pneumonia 05/20/2014  . Pneumonia 05/20/2014  . Elevated liver enzymes 12/16/2013  . Tremor 06/24/2013  . Encounter for therapeutic drug monitoring 05/18/2013  . Dizziness 01/12/2013  . Wound of ankle 10/23/2010  . Long term current use of anticoagulant 07/05/2010  . Occlusion and stenosis of carotid artery without mention of cerebral infarction 11/01/2009  . Atrial fibrillation 10/26/2009  . ACUTE ON CHRONIC DIASTOLIC HEART FAILURE 62/69/4854  . SYNCOPE AND COLLAPSE 10/26/2009    Teena Irani, PTA/CLT [phone] 06/09/2014, 9:29 AM  [place] [address] [place], [place], [address] Phone: [phone]   Fax:  [phone]

## 2014-06-11 ENCOUNTER — Ambulatory Visit (HOSPITAL_COMMUNITY): Payer: Medicare Other | Admitting: Physical Therapy

## 2014-06-11 DIAGNOSIS — R278 Other lack of coordination: Secondary | ICD-10-CM | POA: Diagnosis not present

## 2014-06-11 DIAGNOSIS — R29898 Other symptoms and signs involving the musculoskeletal system: Secondary | ICD-10-CM

## 2014-06-11 DIAGNOSIS — M6281 Muscle weakness (generalized): Secondary | ICD-10-CM | POA: Diagnosis not present

## 2014-06-11 DIAGNOSIS — R262 Difficulty in walking, not elsewhere classified: Secondary | ICD-10-CM | POA: Diagnosis not present

## 2014-06-11 DIAGNOSIS — R2689 Other abnormalities of gait and mobility: Secondary | ICD-10-CM

## 2014-06-11 NOTE — Therapy (Signed)
[place], [place], [address] Phone: [phone]   Fax:  [phone]  Physical Therapy Treatment  Patient Details  Name: [name] MRN: [id number] Date of Birth: 10-15-1940 Referring Provider:  Sanjuana Kava, MD  Encounter Date: 06/11/2014      PT End of Session - 06/11/14 0843    Visit Number 17   Number of Visits 24   Date for PT Re-Evaluation 06/20/14   Authorization Type medicare   Authorization - Visit Number 17   Authorization - Number of Visits 24   PT Start Time 0803   PT Stop Time 9767   PT Time Calculation (min) 41 min   Activity Tolerance Patient limited by fatigue;Patient limited by pain   Behavior During Therapy [facility] for tasks assessed/performed      Past Medical History  Diagnosis Date  . Subclavian steal syndrome   . Carotid bruit     LICA 34-19% (duplex 3/79)  . Hypertension   . Hyperlipidemia   . Atrial fibrillation     x 3 yrs  . Anticoagulation management encounter   . Meningioma   . Osteoporosis   . Stroke     CVA 2008  . Pneumonia   . Breast cancer     S/P left mastectomy, 1989 remained in remission  . Anemia     Past Surgical History  Procedure Laterality Date  . Cardiac catheterization    . Mastectomy Left 1990  . Cataract extraction Bilateral 2013  . Colonoscopy  11/17/2010    Procedure: COLONOSCOPY;  Surgeon: [name], MD;  [facility];  Service: Endoscopy;  Laterality: N/A;  10:45 am  . Esophagogastroduodenoscopy  11/17/2010    Procedure: ESOPHAGOGASTRODUODENOSCOPY (EGD);  Surgeon: [name], MD;  [facility];  Service: Endoscopy;  Laterality: N/A;  . Lumbar epidural injection  06-2012--06-2013    pt. states she has had 5 epidurals in 06-2012----06-2013  . Eye surgery      There were no vitals taken for this visit.  Visit Diagnosis:  Leg weakness, bilateral  Poor balance  Difficulty walking      Subjective Assessment  - 06/11/14 0806    Symptoms Patient states that she fell this week and hit her hip; no bruising, swelling, or acute pain, just a general soreness where she hit her hip.    Pertinent History Pt states that she was on her back for about six weeks and has lost a lot of her strength.    Currently in Pain? Yes   Pain Score 7    Pain Location Back  some throbbing pain in Lhip, no sharp pain and able to bear weight through limb                    [facility] Adult PT Treatment/Exercise - 06/11/14 0001    Lumbar Exercises: Stretches   Active Hamstring Stretch 3 reps;30 seconds   Active Hamstring Stretch Limitations 12 inch box   Passive Hamstring Stretch 3 reps;30 seconds   Passive Hamstring Stretch Limitations Gastroc on slantboard   Lower Trunk Rotation Other (comment)   Lower Trunk Rotation Limitations x2 with 30 second holds   Piriformis Stretch 2 reps;30 seconds   Piriformis Stretch Limitations supine   Lumbar Exercises: Standing   Other Standing Lumbar Exercises Standing side steps 2x53ft   Other Standing Lumbar Exercises Standing marches x75 seconds; hip ABD with cues for proper form x80 seconds  Lumbar Exercises: Seated   Other Seated Lumbar Exercises Pushing WC backwards and forwarrds agaignst resistance of PT for improved hamstring and quad strength   Lumbar Exercises: Supine   Bridge 15 reps   Bridge Limitations Standard bridges;    Straight Leg Raise 15 reps;Other (comment)  twice each leg                PT Education - 06/11/14 [phone]    Education provided Yes   Education Details Education regarding safety at home in order to prevent future falls, avoid injury   Person(s) Educated Patient   Methods Explanation   Comprehension Verbalized understanding          PT Short Term Goals - 05/19/14 0846    PT SHORT TERM GOAL #1   Title I in HEP   Time 1   Status Achieved   PT SHORT TERM GOAL #2   Title Pt to be able to stand for two minutes   Baseline able to  stand for 75 seconds was unable    Time 4   Status On-going   PT SHORT TERM GOAL #3   Title Pt to be able to walk behind wheelchair for five minues   Baseline 90 seconds was 30 seconds    Time 4   Period Weeks   Status On-going   PT SHORT TERM GOAL #4   Title Pain level to be no greater than a 7/10   Baseline 1-/10 was 20/10           PT Long Term Goals - 05/07/14 6568    PT LONG TERM GOAL #1   Title I in advance HEP   Status On-going   PT LONG TERM GOAL #2   Title 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 Continues to require frequent rest breaks during skilled session. Focus today on muscle strength and endurance bilateral lower extremities, as well as standing tasks. Able to tolerate palpation and weight bearing L hip as well as full range of motion, no concern for fracture based upon symptoms and physical performance and pain level at this time. Difficulty in engaging hip abductors without compensation in standing side steps.   Pt will benefit from skilled therapeutic intervention in order to improve on the following deficits Decreased activity tolerance;Decreased balance;Decreased coordination;Decreased mobility;Pain;Difficulty walking;Decreased strength   Rehab Potential Good   PT Frequency 3x / week   PT Duration 8 weeks   PT Treatment/Interventions ADLs/Self Care Home Management;Functional mobility training;Therapeutic activities;Therapeutic exercise;Balance training;Patient/family education   PT Next Visit Plan Continue with slower  progression. Functional standing strengthening/balance tasks. Functional endurance. Treadmill or Nustep next session. More special tests for hip to assist in ID-ing source of pain. Work on pelvic rotation in standing. Contniue with resisted WC endurance/strength exercise.    Consulted and Agree with Plan of Care Patient  Problem List Patient Active Problem List   Diagnosis Date Noted  . Thrombocytosis 05/21/2014  . Protein-calorie malnutrition, severe 05/21/2014  . Anemia   . Generalized weakness   . Necrotic pneumonia 05/20/2014  . Pneumonia 05/20/2014  . Elevated liver enzymes 12/16/2013  . Tremor 06/24/2013  . Encounter for therapeutic drug monitoring 05/18/2013  . Dizziness 01/12/2013  . Wound of ankle 10/23/2010  . Long term current use of anticoagulant 07/05/2010  . Occlusion and stenosis of carotid artery without mention of cerebral infarction 11/01/2009  . HYPERLIPIDEMIA 10/26/2009  . Essential hypertension 10/26/2009  . Atrial fibrillation 10/26/2009  . ACUTE ON CHRONIC DIASTOLIC HEART FAILURE 18/98/4210  . SYNCOPE AND COLLAPSE 10/26/2009   Deniece Ree PT, DPT [place] [address] [place], [place], [address] Phone: [phone]   Fax:  [phone]

## 2014-06-15 ENCOUNTER — Ambulatory Visit (HOSPITAL_COMMUNITY): Payer: Medicare Other | Admitting: Physical Therapy

## 2014-06-15 DIAGNOSIS — R2689 Other abnormalities of gait and mobility: Secondary | ICD-10-CM

## 2014-06-15 DIAGNOSIS — R262 Difficulty in walking, not elsewhere classified: Secondary | ICD-10-CM | POA: Diagnosis not present

## 2014-06-15 DIAGNOSIS — R278 Other lack of coordination: Secondary | ICD-10-CM | POA: Diagnosis not present

## 2014-06-15 DIAGNOSIS — M6281 Muscle weakness (generalized): Secondary | ICD-10-CM | POA: Diagnosis not present

## 2014-06-15 DIAGNOSIS — R29898 Other symptoms and signs involving the musculoskeletal system: Secondary | ICD-10-CM

## 2014-06-15 NOTE — Therapy (Signed)
[place], [place], [address] Phone: [phone]   Fax:  [phone]  Physical Therapy Treatment  Patient Details  Name: [name] MRN: [id number] Date of Birth: 11/20/1940 Referring Provider:  Sanjuana Kava, MD  Encounter Date: 06/15/2014      PT End of Session - 06/15/14 0930    Visit Number 18   Number of Visits 24   Date for PT Re-Evaluation 06/20/14   Authorization Type medicare   Authorization - Visit Number 18   Authorization - Number of Visits 24   PT Start Time 3664   PT Stop Time 0929   PT Time Calculation (min) 42 min   Activity Tolerance Patient limited by fatigue   Behavior During Therapy [facility] for tasks assessed/performed      Past Medical History  Diagnosis Date  . Subclavian steal syndrome   . Carotid bruit     LICA 40-34% (duplex 7/42)  . Hypertension   . Hyperlipidemia   . Atrial fibrillation     x 3 yrs  . Anticoagulation management encounter   . Meningioma   . Osteoporosis   . Stroke     CVA 2008  . Pneumonia   . Breast cancer     S/P left mastectomy, 1989 remained in remission  . Anemia     Past Surgical History  Procedure Laterality Date  . Cardiac catheterization    . Mastectomy Left 1990  . Cataract extraction Bilateral 2013  . Colonoscopy  11/17/2010    Procedure: COLONOSCOPY;  Surgeon: [name], MD;  [facility];  Service: Endoscopy;  Laterality: N/A;  10:45 am  . Esophagogastroduodenoscopy  11/17/2010    Procedure: ESOPHAGOGASTRODUODENOSCOPY (EGD);  Surgeon: [name], MD;  [facility];  Service: Endoscopy;  Laterality: N/A;  . Lumbar epidural injection  06-2012--06-2013    pt. states she has had 5 epidurals in 06-2012----06-2013  . Eye surgery      There were no vitals taken for this visit.  Visit Diagnosis:  Leg weakness, bilateral  Poor balance  Difficulty walking      Subjective Assessment - 06/15/14 0848    Symptoms Patient states that she fell again this weekend; had a fall by trying to close the bathroom door by furniture walking and her L leg just went out from under her. Fell on L hip.    Pertinent History Pt states that she was on her back for about six weeks and has lost a lot of her strength.    Pain Score 8    Pain Location Back                    [facility] Adult PT Treatment/Exercise - 06/15/14 0001    Lumbar Exercises: Stretches   Active Hamstring Stretch 3 reps;30 seconds   Active Hamstring Stretch Limitations 12 inch box   Passive Hamstring Stretch 3 reps;30 seconds   Passive Hamstring Stretch Limitations Gastroc, slantboard   Single Knee to Chest Stretch 2 reps;30 seconds   Single Knee to Chest Stretch Limitations manual    Lower Trunk Rotation Other (comment)   Lower Trunk Rotation Limitations x2 with 30 second holds   Lumbar Exercises: Standing   Other Standing Lumbar Exercises Standing hip flexion/extension, lateral protrusions against resistance of red therband; functional squats with red therband resistance   Other Standing Lumbar Exercises Standing manually resisted rotation of bilateral hips with manual resistance by therapist; 3D  hip excursions in split stance   Lumbar Exercises: Seated   Other Seated Lumbar Exercises Pushing WC backwards and forwarrds agaignst resistance of PT for improved hamstring and quad strength                PT Education - 06/15/14 0930    Education provided Yes   Education Details Education regarding possibility of delayed onset muscle soreness due to progression of exercises in standing   Person(s) Educated Patient   Methods Explanation   Comprehension Verbalized understanding          PT Short Term Goals - 05/19/14 0846    PT SHORT TERM GOAL #1   Title I in HEP   Time 1   Status Achieved   PT SHORT TERM GOAL #2   Title Pt to be able to stand for two minutes   Baseline able to stand for 75 seconds was unable     Time 4   Status On-going   PT SHORT TERM GOAL #3   Title Pt to be able to walk behind wheelchair for five minues   Baseline 90 seconds was 30 seconds    Time 4   Period Weeks   Status On-going   PT SHORT TERM GOAL #4   Title Pain level to be no greater than a 7/10   Baseline 1-/10 was 20/10           PT Long Term Goals - 05/07/14 5784    PT LONG TERM GOAL #1   Title I in advance HEP   Status On-going   PT LONG TERM GOAL #2   Title 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 Patient fatigued at end of tasks, did have some difficulty in moving against resistance. Appeared to have more fatigue than usual today as compared to her norm. No signs of L hip injury after fall again this weekend based on physical performance and range of motion at this time. Diffuse compensation patterns noted in standing. Mod cues to correctly perform new exercises.    Pt will benefit from skilled therapeutic intervention in order to improve on the following deficits Decreased activity tolerance;Decreased balance;Decreased coordination;Decreased mobility;Pain;Difficulty walking;Decreased strength   Rehab Potential Good   PT Frequency 3x / week   PT Duration 8 weeks   PT Treatment/Interventions ADLs/Self Care Home Management;Functional mobility training;Therapeutic activities;Therapeutic exercise;Balance training;Patient/family education   PT Next Visit Plan Continue functional exercises, gauge if resistance band appropriate or not day by day; continue WC endurance/strength  activities; continue to work on pelvic rotation in standing; hip flexor stretch   PT Home Exercise Plan given   Consulted and Agree with Plan of Care Patient        Problem List Patient Active Problem List   Diagnosis Date Noted  . Thrombocytosis 05/21/2014  . Protein-calorie malnutrition, severe 05/21/2014  . Anemia   . Generalized  weakness   . Necrotic pneumonia 05/20/2014  . Pneumonia 05/20/2014  . Elevated liver enzymes 12/16/2013  . Tremor 06/24/2013  . Encounter for therapeutic drug monitoring 05/18/2013  . Dizziness 01/12/2013  . Wound of ankle 10/23/2010  . Long term current use of anticoagulant 07/05/2010  . Occlusion and stenosis of carotid artery without mention of cerebral infarction 11/01/2009  . HYPERLIPIDEMIA 10/26/2009  . Essential hypertension 10/26/2009  . Atrial fibrillation 10/26/2009  . ACUTE ON CHRONIC DIASTOLIC HEART FAILURE 81/77/1165  . SYNCOPE AND COLLAPSE 10/26/2009    Deniece Ree PT, DPT [facility] [address] [place], [place], [address] Phone: [phone]   Fax:  [phone]

## 2014-06-17 ENCOUNTER — Ambulatory Visit (HOSPITAL_COMMUNITY): Payer: Medicare Other | Admitting: Physical Therapy

## 2014-06-17 DIAGNOSIS — M6281 Muscle weakness (generalized): Secondary | ICD-10-CM | POA: Diagnosis not present

## 2014-06-17 DIAGNOSIS — R2689 Other abnormalities of gait and mobility: Secondary | ICD-10-CM

## 2014-06-17 DIAGNOSIS — R262 Difficulty in walking, not elsewhere classified: Secondary | ICD-10-CM | POA: Diagnosis not present

## 2014-06-17 DIAGNOSIS — R278 Other lack of coordination: Secondary | ICD-10-CM | POA: Diagnosis not present

## 2014-06-17 DIAGNOSIS — R29898 Other symptoms and signs involving the musculoskeletal system: Secondary | ICD-10-CM

## 2014-06-17 NOTE — Therapy (Signed)
[place], [place], [address] Phone: [phone]   Fax:  [phone]  Physical Therapy Treatment  Patient Details  Name: [name] MRN: [id number] Date of Birth: 1940-08-15 Referring Provider:  Sanjuana Kava, MD  Encounter Date: 06/17/2014      PT End of Session - 06/17/14 0934    Visit Number 19   Number of Visits 24   Date for PT Re-Evaluation 06/20/14   Authorization Type medicare   Authorization Time Period G-code done on 06/17/14   Authorization - Visit Number 47   Authorization - Number of Visits 24   PT Start Time 0843   PT Stop Time 0927   PT Time Calculation (min) 44 min   Activity Tolerance Patient tolerated treatment well   Behavior During Therapy [facility] for tasks assessed/performed      Past Medical History  Diagnosis Date  . Subclavian steal syndrome   . Carotid bruit     LICA 32-67% (duplex 1/24)  . Hypertension   . Hyperlipidemia   . Atrial fibrillation     x 3 yrs  . Anticoagulation management encounter   . Meningioma   . Osteoporosis   . Stroke     CVA 2008  . Pneumonia   . Breast cancer     S/P left mastectomy, 1989 remained in remission  . Anemia     Past Surgical History  Procedure Laterality Date  . Cardiac catheterization    . Mastectomy Left 1990  . Cataract extraction Bilateral 2013  . Colonoscopy  11/17/2010    Procedure: COLONOSCOPY;  Surgeon: [name], MD;  [facility];  Service: Endoscopy;  Laterality: N/A;  10:45 am  . Esophagogastroduodenoscopy  11/17/2010    Procedure: ESOPHAGOGASTRODUODENOSCOPY (EGD);  Surgeon: [name], MD;  [facility];  Service: Endoscopy;  Laterality: N/A;  . Lumbar epidural injection  06-2012--06-2013    pt. states she has had 5 epidurals in 06-2012----06-2013  . Eye surgery      There were no vitals taken for this visit.  Visit Diagnosis:  Leg weakness, bilateral  Poor balance  Difficulty  walking      Subjective Assessment - 06/17/14 0844    Symptoms Patient states she is having some trouble moving today, still feels a little tired from last session and says she was sore afterwards. No falls in past couple days.   Pertinent History Pt states that she was on her back for about six weeks and has lost a lot of her strength.    Pain Score 8    Pain Location Leg  muscle soreness pain in bilateral legs   Pain Orientation --  bilateral                    [facility] Adult PT Treatment/Exercise - 06/17/14 0001    Lumbar Exercises: Stretches   Active Hamstring Stretch 3 reps;30 seconds   Active Hamstring Stretch Limitations 12 inch step   Passive Hamstring Stretch 3 reps;30 seconds   Quad Stretch 2 reps;30 seconds   Quad Stretch Limitations prone   Lumbar Exercises: Standing   Other Standing Lumbar Exercises Step ups on 2 inch step 1x15   Lumbar Exercises: Seated   Other Seated Lumbar Exercises Pulling/pushing WC forwards/backwards without resistance    Other Seated Lumbar Exercises Navigation of WC over mat on floor in order to replicate carpet at home and improve functional WC mobility skills  PT Education - 23-Jun-2014 0934    Education provided Yes   Education Details Education regarding importance of grading activity to muscle soreness to assist in recovery; prognosis and education on progress with skilled PT services   Person(s) Educated Patient   Methods Explanation   Comprehension Verbalized understanding          PT Short Term Goals - 05/19/14 0846    PT SHORT TERM GOAL #1   Title I in HEP   Time 1   Status Achieved   PT SHORT TERM GOAL #2   Title Pt to be able to stand for two minutes   Baseline able to stand for 75 seconds was unable    Time 4   Status On-going   PT SHORT TERM GOAL #3   Title Pt to be able to walk behind wheelchair for five minues   Baseline 90 seconds was 30 seconds    Time 4   Period Weeks   Status  On-going   PT SHORT TERM GOAL #4   Title Pain level to be no greater than a 7/10   Baseline 1-/10 was 20/10           PT Long Term Goals - 05/07/14 9326    PT LONG TERM GOAL #1   Title I in advance HEP   Status On-going   PT LONG TERM GOAL #2   Title 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 Poor functional endurance; difficulty in navigating WC over resistant surface but patient did improve with practice. Overall low tolerance of challenges during functional exercises at this time. However during this session patient did have reduced pain in back and did not require supine rest breaks between exercises.    Pt will benefit from skilled therapeutic intervention in order to improve on the following deficits Decreased activity tolerance;Decreased balance;Decreased coordination;Decreased mobility;Pain;Difficulty walking;Decreased strength   Rehab Potential Good   PT Frequency 3x / week   PT Duration 8 weeks   PT Treatment/Interventions ADLs/Self Care Home Management;Functional mobility training;Therapeutic activities;Therapeutic exercise;Balance training;Patient/family education   PT Next Visit Plan Continue functional exercises, gauge if resistance band appropriate or not day by day; continue WC endurance/strength activities; continue to work on pelvic rotation in standing; hip flexor stretch   Consulted and Agree with Plan of Care Patient          G-Codes - 06/23/2014 7124    Functional Assessment Tool  Used FOTO    Functional Limitation Other PT primary   Other PT Primary Current Status (P8099) At least 60 percent but less than 80 percent impaired, limited or restricted   Other PT Primary Goal Status (I3382) At least 40 percent but less than 60 percent impaired, limited or restricted      Problem List Patient Active Problem List   Diagnosis Date Noted  .  Thrombocytosis 05/21/2014  . Protein-calorie malnutrition, severe 05/21/2014  . Anemia   . Generalized weakness   . Necrotic pneumonia 05/20/2014  . Pneumonia 05/20/2014  . Elevated liver enzymes 12/16/2013  . Tremor 06/24/2013  . Encounter for therapeutic drug monitoring 05/18/2013  . Dizziness 01/12/2013  . Wound of ankle 10/23/2010  . Long term current use of anticoagulant 07/05/2010  . Occlusion and stenosis of carotid artery without mention of cerebral infarction 11/01/2009  . HYPERLIPIDEMIA 10/26/2009  . Essential hypertension 10/26/2009  . Atrial fibrillation 10/26/2009  . ACUTE ON CHRONIC DIASTOLIC HEART FAILURE 82/42/3536  . SYNCOPE AND COLLAPSE 10/26/2009    Deniece Ree PT, DPT [facility] [address] [place], [place], [address] Phone: [phone]   Fax:  [phone]

## 2014-06-21 ENCOUNTER — Ambulatory Visit (INDEPENDENT_AMBULATORY_CARE_PROVIDER_SITE_OTHER): Payer: Medicare Other | Admitting: *Deleted

## 2014-06-21 ENCOUNTER — Ambulatory Visit (HOSPITAL_COMMUNITY): Payer: Medicare Other | Admitting: Physical Therapy

## 2014-06-21 DIAGNOSIS — I480 Paroxysmal atrial fibrillation: Secondary | ICD-10-CM

## 2014-06-21 DIAGNOSIS — I4891 Unspecified atrial fibrillation: Secondary | ICD-10-CM

## 2014-06-21 DIAGNOSIS — Z5181 Encounter for therapeutic drug level monitoring: Secondary | ICD-10-CM | POA: Diagnosis not present

## 2014-06-21 DIAGNOSIS — R262 Difficulty in walking, not elsewhere classified: Secondary | ICD-10-CM | POA: Diagnosis not present

## 2014-06-21 DIAGNOSIS — R278 Other lack of coordination: Secondary | ICD-10-CM | POA: Diagnosis not present

## 2014-06-21 DIAGNOSIS — R29898 Other symptoms and signs involving the musculoskeletal system: Secondary | ICD-10-CM

## 2014-06-21 DIAGNOSIS — R2689 Other abnormalities of gait and mobility: Secondary | ICD-10-CM

## 2014-06-21 DIAGNOSIS — M6281 Muscle weakness (generalized): Secondary | ICD-10-CM | POA: Diagnosis not present

## 2014-06-21 LAB — POCT INR: INR: 3

## 2014-06-21 NOTE — Therapy (Signed)
[place], [place], [address] Phone: [phone]   Fax:  [phone]  Physical Therapy Treatment  Patient Details  Name: [name] MRN: [id number] Date of Birth: 12/07/1940 Referring Provider:  Sanjuana Kava, MD  Encounter Date: 06/21/2014      PT End of Session - 06/21/14 0920    Visit Number 20   Number of Visits 24   Date for PT Re-Evaluation 06/20/14   Authorization Type medicare   Authorization Time Period G-code done on 06/17/14   Authorization - Visit Number 57   Authorization - Number of Visits 29   PT Start Time 0805   PT Stop Time 0850   PT Time Calculation (min) 45 min   Activity Tolerance Patient tolerated treatment well   Behavior During Therapy [facility] for tasks assessed/performed      Past Medical History  Diagnosis Date  . Subclavian steal syndrome   . Carotid bruit     LICA 10-17% (duplex 5/10)  . Hypertension   . Hyperlipidemia   . Atrial fibrillation     x 3 yrs  . Anticoagulation management encounter   . Meningioma   . Osteoporosis   . Stroke     CVA 2008  . Pneumonia   . Breast cancer     S/P left mastectomy, 1989 remained in remission  . Anemia     Past Surgical History  Procedure Laterality Date  . Cardiac catheterization    . Mastectomy Left 1990  . Cataract extraction Bilateral 2013  . Colonoscopy  11/17/2010    Procedure: COLONOSCOPY;  Surgeon: [name], MD;  [facility];  Service: Endoscopy;  Laterality: N/A;  10:45 am  . Esophagogastroduodenoscopy  11/17/2010    Procedure: ESOPHAGOGASTRODUODENOSCOPY (EGD);  Surgeon: [name], MD;  [facility];  Service: Endoscopy;  Laterality: N/A;  . Lumbar epidural injection  06-2012--06-2013    pt. states she has had 5 epidurals in 06-2012----06-2013  . Eye surgery      There were no vitals filed for this visit.  Visit Diagnosis:  Leg weakness, bilateral  Poor balance  Difficulty  walking      Subjective Assessment - 06/21/14 0931    Symptoms Pt states she's feeling the best today than she has in some time.  States she has more energy.   Currently in Pain? No/denies                       [facility] Adult PT Treatment/Exercise - 06/21/14 0805    Ambulation/Gait   Gait Comments Gait x 163ft, 65ft, 70ft pushing wheelchair   Lumbar Exercises: Stretches   Active Hamstring Stretch 3 reps;30 seconds   Active Hamstring Stretch Limitations 12 inch step   Passive Hamstring Stretch 3 reps;30 seconds   Passive Hamstring Stretch Limitations Gastroc, slantboard   Lower Trunk Rotation Other (comment)   Lower Trunk Rotation Limitations x2 with 30 second holds   Lumbar Exercises: Standing   Other Standing Lumbar Exercises Step ups forward on 2 inch step 10 reps each 1 HHA. lateral step ups 4" 2 HHA 10 reps   Lumbar Exercises: Supine   Bridge 15 reps   Straight Leg Raise 15 reps;Other (comment)   Other Supine Lumbar Exercises Hip ABD 2x10 bilaterally; attempted in sidelying but patient unable to perform correctly    Lumbar Exercises: Prone   Straight Leg Raise 10 reps   Other Prone Lumbar  Exercises hamstring curls with eccentric 10 reps                  PT Short Term Goals - 05/19/14 0846    PT SHORT TERM GOAL #1   Title I in HEP   Time 1   Status Achieved   PT SHORT TERM GOAL #2   Title Pt to be able to stand for two minutes   Baseline able to stand for 75 seconds was unable    Time 4   Status On-going   PT SHORT TERM GOAL #3   Title Pt to be able to walk behind wheelchair for five minues   Baseline 90 seconds was 30 seconds    Time 4   Period Weeks   Status On-going   PT SHORT TERM GOAL #4   Title Pain level to be no greater than a 7/10   Baseline 1-/10 was 20/10           PT Long Term Goals - 05/07/14 1610    PT LONG TERM GOAL #1   Title I in advance HEP   Status On-going   PT LONG TERM GOAL #2   Title Pt to be able to stand  for five minutes to iron two shirts   Status On-going   PT LONG TERM GOAL #3   Title Pt to be able to walk in the house with a cane    Status On-going   PT LONG TERM GOAL #4   Title Pain level to be no greater than a 5/10 80% of the time    Status On-going               Plan - 06/21/14 0921    Clinical Impression Statement Pt needing decreased rest breaks today with overall improved mobility and endurance.  Pt very encouraged by her increased ability to ambulate and decreased need of rest.  Added lateral step ups with 4" step and decreased UE assistance with forward step ups.  completed stretches and mat actvities at the end of session.  Noted glute and hamstring weakness with focus on eccentric control.     PT Next Visit Plan Continue functional exercises, gauge if resistance band appropriate or not day by day; continue WC endurance/strength activities; continue to work on pelvic rotation in standing; hip flexor stretch   Consulted and Agree with Plan of Care Patient        Problem List Patient Active Problem List   Diagnosis Date Noted  . HYPERLIPIDEMIA 10/26/2009    Priority: Medium  . Essential hypertension 10/26/2009    Priority: Low  . Thrombocytosis 05/21/2014  . Protein-calorie malnutrition, severe 05/21/2014  . Anemia   . Generalized weakness   . Necrotic pneumonia 05/20/2014  . Pneumonia 05/20/2014  . Elevated liver enzymes 12/16/2013  . Tremor 06/24/2013  . Encounter for therapeutic drug monitoring 05/18/2013  . Dizziness 01/12/2013  . Wound of ankle 10/23/2010  . Long term current use of anticoagulant 07/05/2010  . Occlusion and stenosis of carotid artery without mention of cerebral infarction 11/01/2009  . Atrial fibrillation 10/26/2009  . ACUTE ON CHRONIC DIASTOLIC HEART FAILURE 96/07/5407  . SYNCOPE AND COLLAPSE 10/26/2009    Teena Irani, PTA/CLT [phone] 06/21/2014, 9:31 AM  [place], [place], [address] Phone: [phone]   Fax:  [phone]

## 2014-06-23 ENCOUNTER — Ambulatory Visit (HOSPITAL_COMMUNITY): Payer: Medicare Other | Admitting: Physical Therapy

## 2014-06-23 DIAGNOSIS — R29898 Other symptoms and signs involving the musculoskeletal system: Secondary | ICD-10-CM

## 2014-06-23 DIAGNOSIS — R262 Difficulty in walking, not elsewhere classified: Secondary | ICD-10-CM | POA: Diagnosis not present

## 2014-06-23 DIAGNOSIS — M6281 Muscle weakness (generalized): Secondary | ICD-10-CM | POA: Diagnosis not present

## 2014-06-23 DIAGNOSIS — R278 Other lack of coordination: Secondary | ICD-10-CM | POA: Diagnosis not present

## 2014-06-23 DIAGNOSIS — R2689 Other abnormalities of gait and mobility: Secondary | ICD-10-CM

## 2014-06-23 NOTE — Therapy (Signed)
[place], [place], [address] Phone: [phone]   Fax:  [phone]  Physical Therapy Treatment  Patient Details  Name: [name] MRN: [id number] Date of Birth: [birth year] Referring Provider:  Sanjuana Kava, MD  Encounter Date: 06/23/2014      PT End of Session - 06/23/14 0938    Visit Number 21   Number of Visits 24   Date for PT Re-Evaluation 07/14/14   Authorization Type medicare   Authorization Time Period G-code done on 06/17/14   Authorization - Visit Number 21   Authorization - Number of Visits 29   PT Start Time 3976   PT Stop Time 0928   PT Time Calculation (min) 41 min   Activity Tolerance Patient limited by fatigue   Behavior During Therapy [facility] for tasks assessed/performed      Past Medical History  Diagnosis Date  . Subclavian steal syndrome   . Carotid bruit     LICA 73-41% (duplex 9/37)  . Hypertension   . Hyperlipidemia   . Atrial fibrillation     x 3 yrs  . Anticoagulation management encounter   . Meningioma   . Osteoporosis   . Stroke     CVA 2008  . Pneumonia   . Breast cancer     S/P left mastectomy, 1989 remained in remission  . Anemia     Past Surgical History  Procedure Laterality Date  . Cardiac catheterization    . Mastectomy Left 1990  . Cataract extraction Bilateral 2013  . Colonoscopy  11/17/2010    Procedure: COLONOSCOPY;  Surgeon: [name], MD;  Location: [facility];  Service: Endoscopy;  Laterality: N/A;  10:45 am  . Esophagogastroduodenoscopy  11/17/2010    Procedure: ESOPHAGOGASTRODUODENOSCOPY (EGD);  Surgeon: [name], MD;  Location: [facility];  Service: Endoscopy;  Laterality: N/A;  . Lumbar epidural injection  06-2012--06-2013    pt. states she has had 5 epidurals in 06-2012----06-2013  . Eye surgery      There were no vitals filed for this visit.  Visit Diagnosis:  Leg weakness, bilateral - Plan: PT plan of care  cert/re-cert  Poor balance - Plan: PT plan of care cert/re-cert  Difficulty walking - Plan: PT plan of care cert/re-cert      Subjective Assessment - 06/23/14 0852    Symptoms Patient states she's feeling absolutely miserable today; says she felt good during last session but since has felt very sore and just very tired and drained   Pertinent History Pt states that she was on her back for about six weeks and has lost a lot of her strength.    Currently in Pain? Yes   Pain Score 9    Pain Location Other (Comment)  legs and lower back   Pain Orientation Other (Comment)  bilateral            [facility] PT Assessment - 06/23/14 0001    Observation/Other Assessments   Focus on Therapeutic Outcomes (FOTO)  73% limited   Strength   Right Hip Flexion 3+/5   Right Hip Extension 3+/5   Right Hip ABduction 2/5  severe muscle soreness   Left Hip Flexion 3+/5   Left Hip Extension 3/5   Left Hip ABduction 2/5  severe muscle soreness today   Right Knee Flexion 4/5   Right Knee Extension 4/5   Right Ankle Dorsiflexion 3-/5   Left Ankle Dorsiflexion 2+/5  AFO in place-  MMT grade adjusted to account for brace   Ambulation/Gait   Gait Comments 6 Minute Walk Test 173ft totat; 2 rest breaks provided                   [facility] Adult PT Treatment/Exercise - 06/23/14 0001    Lumbar Exercises: Supine   Bridge 15 reps   Straight Leg Raise 15 reps   Other Supine Lumbar Exercises Hip ABD 1x5 with cues for form                PT Education - 06/23/14 0938    Education provided Yes   Education Details Education regarding progress with skilled PT services; also education regarding delayed onset muscle soreness and impact on function   Person(s) Educated Patient   Methods Explanation   Comprehension Verbalized understanding          PT Short Term Goals - 06/23/14 0942    PT SHORT TERM GOAL #1   Title I in HEP   Time 1   Period Weeks   Status Achieved   PT SHORT TERM  GOAL #2   Title Pt to be able to stand for two minutes   Time 4   Period Weeks   Status On-going   PT SHORT TERM GOAL #3   Title Pt to be able to walk behind wheelchair for five minues   Baseline 3/16- approximately 2 minutes   Time 4   Period Weeks   Status On-going   PT SHORT TERM GOAL #4   Title Pain level to be no greater than a 7/10   Baseline 3/16- 9/10   Status On-going           PT Long Term Goals - 06/23/14 0943    PT LONG TERM GOAL #1   Title I in advance HEP   Time 8   Period Weeks   Status On-going   PT LONG TERM GOAL #2   Title 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   Time 8   Period Weeks   Status On-going   PT LONG TERM GOAL #3   Title Pt to be able to walk in the house with a cane    Time 8   Period Weeks   Status On-going   PT LONG TERM GOAL #4   Title Pain level to be no greater than a 5/10 80% of the time    Time 8   Period Weeks   Status On-going               Plan - 06/23/14 0941    Clinical Impression Statement Re-assessment performed on this date; it is crucial to note that patient is experiencing significant delayed onset muscle soreness today and as such demonstrated reduced muscle strength and lower level of function today than may be accurate. Patient does demonstrate improved muscle strength in some proximal musculature and in other areas in her lower legs such as hamstrings and quadriceps. Patient continues to show reduced gait tolerance and overall functional activity tolerance today. However, again it is important to note that patient was limited by muscle soreness this monning, and on days that she is not experiencing muscle soreness she does display improved function and activity tolerance. Patient will benefit from continued skilled PT services in order to contniue to address reduced strength, reduced functional mobility skills, reduced gait tolerance/impaired gait mechanics, and to reduce overall fall risk.    Pt  will benefit from skilled therapeutic intervention in order to improve on the following deficits Decreased activity tolerance;Decreased balance;Decreased coordination;Decreased mobility;Pain;Difficulty walking;Decreased strength   Rehab Potential Good   PT Frequency 3x / week   PT Duration 8 weeks   PT Treatment/Interventions ADLs/Self Care Home Management;Functional mobility training;Therapeutic activities;Therapeutic exercise;Balance training;Patient/family education   PT Next Visit Plan Continue functional exercises, gauge if resistance band appropriate or not day by day; continue WC endurance/strength activities; continue to work on pelvic rotation in standing; hip flexor stretch   PT Home Exercise Plan given   Consulted and Agree with Plan of Care Patient        Problem List Patient Active Problem List   Diagnosis Date Noted  . Thrombocytosis 05/21/2014  . Protein-calorie malnutrition, severe 05/21/2014  . Anemia   . Generalized weakness   . Necrotic pneumonia 05/20/2014  . Pneumonia 05/20/2014  . Elevated liver enzymes 12/16/2013  . Tremor 06/24/2013  . Encounter for therapeutic drug monitoring 05/18/2013  . Dizziness 01/12/2013  . Wound of ankle 10/23/2010  . Long term current use of anticoagulant 07/05/2010  . Occlusion and stenosis of carotid artery without mention of cerebral infarction 11/01/2009  . HYPERLIPIDEMIA 10/26/2009  . Essential hypertension 10/26/2009  . Atrial fibrillation 10/26/2009  . ACUTE ON CHRONIC DIASTOLIC HEART FAILURE 67/34/1937  . SYNCOPE AND COLLAPSE 10/26/2009    Deniece Ree PT, DPT [place] [address] [place], [place], [address] Phone: [phone]   Fax:  [phone]

## 2014-06-25 ENCOUNTER — Encounter (HOSPITAL_COMMUNITY): Payer: Medicare Other | Admitting: Physical Therapy

## 2014-06-27 NOTE — Progress Notes (Signed)
Patient ID: [name], female   DOB: 06/13/1940, 74 y.o.   MRN: [id number] [name] is seen for F/U of PAF, bruit, right subclavian steal and anticoagulatin She has known vascular disease primarily in the sublavian and carotids. She was admitted with syncope last year . She had PAF with diastolic heart failure. Her cath showed only diagonal disease with no critical major vessel disease. EF was normal. She has seen [name] in F/U She has an occluded inominate on the right and had failed previous stenting attempts. She has 60-79% stenosis on left ICA. In combination with rapid atrial fibrillation this may have contributed to a brief loss of consdousness. Her diastolic CHF cleared with restoration of NSR. She was sent home on low dose amiodarone. She has had no SOB, or palpitations since being home. there have been no bleeding problems.   Reviewed duplex from [facility]  2/99  37-16%  LICA stenosis. Innominate right occlusion with steal  Reviewed labs from last week and AST/ALT much better in 50 range down from 120  Seen in ER 2/16 with cough  Given Augmentin but CT with mass in RLL ? necrotising pneumonia or cancer  Reviewed  Has lost about 15 lbs  Looks weak  Indicates breathing OK   ROS: Denies fever, malais, weight loss, blurry vision, decreased visual acuity, cough, sputum, SOB, hemoptysis, pleuritic pain, palpitaitons, heartburn, abdominal pain, melena, lower extremity edema, claudication, or rash.  All other systems reviewed and negative  General: Affect appropriate Chronically ill female  HEENT: normal Neck supple with no adenopathy JVP normal bilateral subclavian bruits and carotid  bruits no thyromegaly Lungs rhonchi decreased BS right base no  wheezing and good diaphragmatic motion Heart:  S1/S2 no murmur, no rub, gallop or click PMI normal Abdomen: benighn, BS positve, no tenderness, no AAA no bruit.  No HSM or HJR Distal pulses intact with no bruits No edema Neuro non-focal Skin warm and  dry bruising  No muscular weakness   Current Outpatient Prescriptions  Medication Sig Dispense Refill  . acetaminophen (TYLENOL) 500 MG tablet Take 1,000 mg by mouth every 6 (six) hours as needed for mild pain or moderate pain.     Marland Kitchen alendronate (FOSAMAX) 70 MG tablet Take 70 mg by mouth every Sunday. Take in the morning with a full glass of water, on an empty stomach, and do not take anything else by mouth or lie down for the next 30 min. Patient take on Sunday    . amiodarone (PACERONE) 200 MG tablet Take 1 tablet by mouth  daily (Patient taking differently: Take 1 tablet by mouth  daily in the morning) 30 tablet 6  . amoxicillin-clavulanate (AUGMENTIN) 875-125 MG per tablet Take 1 tablet by mouth 2 (two) times daily. 20 tablet 0  . aspirin EC 81 MG tablet Take 81 mg by mouth every morning.     . calcium-vitamin D (OSCAL) 250-125 MG-UNIT per tablet Take 1 tablet by mouth 2 (two) times daily.      . clopidogrel (PLAVIX) 75 MG tablet Take 1 tablet by mouth  every morning 90 tablet 0  . Iron-FA-B Cmp-C-Biot-Probiotic (FUSION PLUS) CAPS Take 1 capsule by mouth every morning.     . magnesium oxide (MAG-OX) 400 MG tablet Take 400 mg by mouth every morning.     . meloxicam (MOBIC) 15 MG tablet Take 15 mg by mouth every morning.      . metoprolol (LOPRESSOR) 50 MG tablet Take 1 tablet (50 mg total) by mouth  2 (two) times daily. 60 tablet 0  . Multiple Vitamin (MULTIVITAMIN WITH MINERALS) TABS tablet Take 1 tablet by mouth every morning.    . Omega-3 Fatty Acids (FISH OIL) 1200 MG CAPS Take 1,200 mg by mouth 2 (two) times daily.    Marland Kitchen oxyCODONE-acetaminophen (PERCOCET) 7.5-325 MG per tablet Take 0.5 tablets by mouth at bedtime as needed. For pain    . pravastatin (PRAVACHOL) 40 MG tablet Take 1 tablet by mouth  every night at bedtime 90 tablet 1  . vitamin C (ASCORBIC ACID) 500 MG tablet Take 500 mg by mouth every morning.     . warfarin (COUMADIN) 1 MG tablet Take 1 tablet (1 mg total) by mouth one  time only at 6 PM. (Patient taking differently: Take 2-4 mg by mouth every evening. 2mg  on all days except Mondays. Take 4mg  on Mondays) 90 tablet 1  . WELCHOL 625 MG tablet Take 3 tablets by mouth  twice a day with a meal 180 tablet 3   No current facility-administered medications for this visit.    Allergies  Iodine; Iohexol; and Tape  Electrocardiogram:  05/21/14  SR PR 276  [facility]    Assessment and Plan

## 2014-06-28 ENCOUNTER — Telehealth: Payer: Self-pay | Admitting: Internal Medicine

## 2014-06-28 ENCOUNTER — Ambulatory Visit (INDEPENDENT_AMBULATORY_CARE_PROVIDER_SITE_OTHER): Payer: Medicare Other | Admitting: Cardiovascular Disease

## 2014-06-28 ENCOUNTER — Encounter: Payer: Self-pay | Admitting: Cardiovascular Disease

## 2014-06-28 ENCOUNTER — Ambulatory Visit (HOSPITAL_COMMUNITY): Payer: Medicare Other | Admitting: Physical Therapy

## 2014-06-28 VITALS — BP 120/60 | HR 76 | Ht 65.0 in | Wt 99.8 lb

## 2014-06-28 DIAGNOSIS — J850 Gangrene and necrosis of lung: Secondary | ICD-10-CM | POA: Diagnosis not present

## 2014-06-28 DIAGNOSIS — R938 Abnormal findings on diagnostic imaging of other specified body structures: Secondary | ICD-10-CM | POA: Diagnosis not present

## 2014-06-28 DIAGNOSIS — R278 Other lack of coordination: Secondary | ICD-10-CM | POA: Diagnosis not present

## 2014-06-28 DIAGNOSIS — R262 Difficulty in walking, not elsewhere classified: Secondary | ICD-10-CM

## 2014-06-28 DIAGNOSIS — R2689 Other abnormalities of gait and mobility: Secondary | ICD-10-CM

## 2014-06-28 DIAGNOSIS — I10 Essential (primary) hypertension: Secondary | ICD-10-CM | POA: Diagnosis not present

## 2014-06-28 DIAGNOSIS — M6281 Muscle weakness (generalized): Secondary | ICD-10-CM | POA: Diagnosis not present

## 2014-06-28 DIAGNOSIS — R0989 Other specified symptoms and signs involving the circulatory and respiratory systems: Secondary | ICD-10-CM

## 2014-06-28 DIAGNOSIS — R918 Other nonspecific abnormal finding of lung field: Secondary | ICD-10-CM

## 2014-06-28 DIAGNOSIS — R29898 Other symptoms and signs involving the musculoskeletal system: Secondary | ICD-10-CM

## 2014-06-28 NOTE — Therapy (Addendum)
[place], [place], [address] Phone: [phone]   Fax:  [phone]  Physical Therapy Treatment  Patient Details  Name: [name] MRN: [id number] Date of Birth: 08/21/1940 Referring Provider:  Sanjuana Kava, MD  Encounter Date: 06/28/2014      PT End of Session - 06/28/14 0844    Visit Number 22   Number of Visits 24   Date for PT Re-Evaluation 07/14/14   Authorization Type medicare   Authorization Time Period G-code done on 06/17/14   Authorization - Visit Number 103   Authorization - Number of Visits 30   PT Start Time 0802   PT Stop Time 0842   PT Time Calculation (min) 40 min   Equipment Utilized During Treatment Gait belt      Past Medical History  Diagnosis Date  . Subclavian steal syndrome   . Carotid bruit     LICA 47-42% (duplex 5/95)  . Hypertension   . Hyperlipidemia   . Atrial fibrillation     x 3 yrs  . Anticoagulation management encounter   . Meningioma   . Osteoporosis   . Stroke     CVA 2008  . Pneumonia   . Breast cancer     S/P left mastectomy, 1989 remained in remission  . Anemia     Past Surgical History  Procedure Laterality Date  . Cardiac catheterization    . Mastectomy Left 1990  . Cataract extraction Bilateral 2013  . Colonoscopy  11/17/2010    Procedure: COLONOSCOPY;  Surgeon: [name], MD;  [facility];  Service: Endoscopy;  Laterality: N/A;  10:45 am  . Esophagogastroduodenoscopy  11/17/2010    Procedure: ESOPHAGOGASTRODUODENOSCOPY (EGD);  Surgeon: [name], MD;  [facility];  Service: Endoscopy;  Laterality: N/A;  . Lumbar epidural injection  06-2012--06-2013    pt. states she has had 5 epidurals in 06-2012----06-2013  . Eye surgery      There were no vitals filed for this visit.  Visit Diagnosis:  Leg weakness, bilateral  Poor balance  Difficulty walking      Subjective Assessment - 06/28/14 0803    Symptoms  Pt states that she is sore and achey.  She is doing her exercises at home.    Currently in Pain? Yes   Pain Score 8    Pain Location Leg   Pain Orientation Right;Left           [facility] Adult PT Treatment/Exercise - 06/28/14 0001    Lumbar Exercises: Standing   Heel Raises 10 reps   Functional Squats 10 reps   Gait  --  Gait with rolling walker concentrating on keeping chest high   Other Standing Lumbar Exercises weight shifting forward and back;; side to side no UE x 10;    Other Standing Lumbar Exercises marching 1 UE support x 10; side stepping x 1 RT around mat.    Lumbar Exercises: Seated   Sit to Stand 5 reps with Rt LE in back;5 reps with Lt LE in back  no hands           PT Education - 06/28/14 0843    Education provided Yes   Education Details Pt to complete 10 toe raise every hour while sitting    Person(s) Educated Patient   Methods Explanation   Comprehension Verbalized understanding;Returned demonstration          PT Short Term Goals - 06/23/14  0942    PT SHORT TERM GOAL #1   Title I in HEP   Time 1   Period Weeks   Status Achieved   PT SHORT TERM GOAL #2   Title Pt to be able to stand for two minutes   Time 4   Period Weeks   Status On-going   PT SHORT TERM GOAL #3   Title Pt to be able to walk behind wheelchair for five minues   Baseline 3/16- approximately 2 minutes   Time 4   Period Weeks   Status On-going   PT SHORT TERM GOAL #4   Title Pain level to be no greater than a 7/10   Baseline 3/16- 9/10   Status On-going           PT Long Term Goals - 06/23/14 0943    PT LONG TERM GOAL #1   Title I in advance HEP   Time 8   Period Weeks   Status On-going   PT LONG TERM GOAL #2   Title 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   Time 8   Period Weeks   Status On-going   PT LONG TERM GOAL #3   Title Pt to be able to walk in the house with a cane    Time 8   Period Weeks   Status On-going   PT LONG TERM GOAL #4    Title Pain level to be no greater than a 5/10 80% of the time    Time 8   Period Weeks   Status On-going               Plan - 06/28/14 0845    Clinical Impression Statement Pt began more weight bearing exercises to improve standing tolerance.  Pt has progressed well but still has significant decreased dynamic balance as well as LE weakness.  Continue to focus on higher weightbearing activity to progress towards pt's goal of being able to stand and walk more. Pt needed multiple rest breaks throughout treatment.    Pt will benefit from skilled therapeutic intervention in order to improve on the following deficits Decreased activity tolerance;Decreased balance;Decreased coordination;Decreased mobility;Pain;Difficulty walking;Decreased strength   PT Frequency 3x / week   PT Duration 8 weeks   PT Next Visit Plan Continue functional weight bearing  exercises        Problem List Patient Active Problem List   Diagnosis Date Noted  . Thrombocytosis 05/21/2014  . Protein-calorie malnutrition, severe 05/21/2014  . Anemia   . Generalized weakness   . Necrotic pneumonia 05/20/2014  . Pneumonia 05/20/2014  . Elevated liver enzymes 12/16/2013  . Tremor 06/24/2013  . Encounter for therapeutic drug monitoring 05/18/2013  . Dizziness 01/12/2013  . Wound of ankle 10/23/2010  . Long term current use of anticoagulant 07/05/2010  . Occlusion and stenosis of carotid artery without mention of cerebral infarction 11/01/2009  . HYPERLIPIDEMIA 10/26/2009  . Essential hypertension 10/26/2009  . Atrial fibrillation 10/26/2009  . ACUTE ON CHRONIC DIASTOLIC HEART FAILURE 51/88/4166  . SYNCOPE AND COLLAPSE 10/26/2009   Rayetta Humphrey PT/ [phone] 06/28/2014, 8:47 AM  [place] [address] [place], [place], [address] Phone: [phone]   Fax:  [phone]

## 2014-06-28 NOTE — Assessment & Plan Note (Signed)
Having f/u CT at [facility] next [facility]  Will arrange f/u with our pulmonary doctors that Thursday or Friday Concern for lung CA with continued Abnormal exam and weight loss

## 2014-06-28 NOTE — Assessment & Plan Note (Signed)
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

## 2014-06-28 NOTE — Assessment & Plan Note (Signed)
Well controlled.  Continue current medications and low sodium Dash type diet.    

## 2014-06-28 NOTE — Patient Instructions (Signed)
Your physician wants you to follow-up in:   [name] will receive a reminder letter in the mail two months in advance. If you don't receive a letter, please call our office to schedule the follow-up appointment.  Your physician recommends that you continue on your current medications as directed. Please refer to the Current Medication list given to you today.  You have been referred to PULMONARY  Thursday OR  Friday   POSSIBLE LUNG  CANCER

## 2014-06-28 NOTE — Telephone Encounter (Signed)
Called and spoke to pt's husband. Made appt with MR on 4/1. Pt's husband verbalized understanding and denied any further questions or concerns at this time.

## 2014-06-29 DIAGNOSIS — R748 Abnormal levels of other serum enzymes: Secondary | ICD-10-CM | POA: Diagnosis not present

## 2014-06-30 ENCOUNTER — Ambulatory Visit (HOSPITAL_COMMUNITY): Payer: Medicare Other | Admitting: Physical Therapy

## 2014-06-30 DIAGNOSIS — R29898 Other symptoms and signs involving the musculoskeletal system: Secondary | ICD-10-CM

## 2014-06-30 DIAGNOSIS — M6281 Muscle weakness (generalized): Secondary | ICD-10-CM | POA: Diagnosis not present

## 2014-06-30 DIAGNOSIS — R262 Difficulty in walking, not elsewhere classified: Secondary | ICD-10-CM

## 2014-06-30 DIAGNOSIS — R278 Other lack of coordination: Secondary | ICD-10-CM | POA: Diagnosis not present

## 2014-06-30 DIAGNOSIS — R2689 Other abnormalities of gait and mobility: Secondary | ICD-10-CM

## 2014-06-30 LAB — COMPREHENSIVE METABOLIC PANEL
ALK PHOS: 96 U/L (ref 39–117)
ALT: 30 U/L (ref 0–35)
AST: 31 U/L (ref 0–37)
Albumin: 3.8 g/dL (ref 3.5–5.2)
BILIRUBIN TOTAL: 0.4 mg/dL (ref 0.2–1.2)
BUN: 21 mg/dL (ref 6–23)
CO2: 24 mEq/L (ref 19–32)
Calcium: 9.1 mg/dL (ref 8.4–10.5)
Chloride: 103 mEq/L (ref 96–112)
Creat: 0.67 mg/dL (ref 0.50–1.10)
Glucose, Bld: 82 mg/dL (ref 70–99)
Potassium: 4.9 mEq/L (ref 3.5–5.3)
SODIUM: 133 meq/L — AB (ref 135–145)
TOTAL PROTEIN: 6.9 g/dL (ref 6.0–8.3)

## 2014-06-30 NOTE — Therapy (Signed)
[place], [place], [address] Phone: [phone]   Fax:  [phone]  Physical Therapy Treatment  Patient Details  Name: [name] MRN: [id number] Date of Birth: 1940/12/04 Referring Provider:  Sanjuana Kava, MD  Encounter Date: 06/30/2014      PT End of Session - 06/30/14 1259    Visit Number 23   Number of Visits 24   Date for PT Re-Evaluation 07/14/14   Authorization Type medicare   Authorization Time Period G-code done on 06/17/14   Authorization - Visit Number 23   Authorization - Number of Visits 30   Activity Tolerance Patient limited by fatigue;Patient tolerated treatment well   Behavior During Therapy [facility] for tasks assessed/performed      Past Medical History  Diagnosis Date  . Subclavian steal syndrome   . Carotid bruit     LICA 69-62% (duplex 9/52)  . Hypertension   . Hyperlipidemia   . Atrial fibrillation     x 3 yrs  . Anticoagulation management encounter   . Meningioma   . Osteoporosis   . Stroke     CVA 2008  . Pneumonia   . Breast cancer     S/P left mastectomy, 1989 remained in remission  . Anemia     Past Surgical History  Procedure Laterality Date  . Cardiac catheterization    . Mastectomy Left 1990  . Cataract extraction Bilateral 2013  . Colonoscopy  11/17/2010    Procedure: COLONOSCOPY;  Surgeon: [name], MD;  [facility];  Service: Endoscopy;  Laterality: N/A;  10:45 am  . Esophagogastroduodenoscopy  11/17/2010    Procedure: ESOPHAGOGASTRODUODENOSCOPY (EGD);  Surgeon: [name], MD;  [facility];  Service: Endoscopy;  Laterality: N/A;  . Lumbar epidural injection  06-2012--06-2013    pt. states she has had 5 epidurals in 06-2012----06-2013  . Eye surgery      There were no vitals filed for this visit.  Visit Diagnosis:  Leg weakness, bilateral  Difficulty walking  Poor balance      Subjective Assessment - 06/30/14 1208     Symptoms Patient very anxious about getting printouts of exercises from last time, appeared irritated towards therapist on this date.    Pertinent History Pt states that she was on her back for about six weeks and has lost a lot of her strength.    Currently in Pain? Yes   Pain Score 5    Pain Location --  bilateral legs                        [facility] Adult PT Treatment/Exercise - 06/30/14 0001    Ambulation/Gait   Gait Comments Gait approximately 117ft pushing wheelchair   Lumbar Exercises: Stretches   Active Hamstring Stretch 3 reps;30 seconds   Active Hamstring Stretch Limitations 8 inch box   Passive Hamstring Stretch 3 reps;30 seconds   Passive Hamstring Stretch Limitations slantboard   Lumbar Exercises: Standing   Heel Raises 15 reps   Functional Squats 10 reps   Other Standing Lumbar Exercises weight shifts forward/back, lateral    Other Standing Lumbar Exercises standing marches with B UE HHA; functional squats with staggered stance 1x5 each side; sit to stand with mini-squat pulses 1x3                PT Education - 06/30/14 1210    Education provided Yes   Education  Details Review of general exercises given last session; education regarding importance of stretches and warm up with activity, proper form of all exercises   Person(s) Educated Patient   Methods Explanation   Comprehension Verbalized understanding;Returned demonstration  verbally disagreed with therapist regarding form for some exercises and attempted to perform with improper technique           PT Short Term Goals - 06/23/14 0942    PT SHORT TERM GOAL #1   Title I in HEP   Time 1   Period Weeks   Status Achieved   PT SHORT TERM GOAL #2   Title Pt to be able to stand for two minutes   Time 4   Period Weeks   Status On-going   PT SHORT TERM GOAL #3   Title Pt to be able to walk behind wheelchair for five minues   Baseline 3/16- approximately 2 minutes   Time 4   Period  Weeks   Status On-going   PT SHORT TERM GOAL #4   Title Pain level to be no greater than a 7/10   Baseline 3/16- 9/10   Status On-going           PT Long Term Goals - 06/23/14 0943    PT LONG TERM GOAL #1   Title I in advance HEP   Time 8   Period Weeks   Status On-going   PT LONG TERM GOAL #2   Title 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   Time 8   Period Weeks   Status On-going   PT LONG TERM GOAL #3   Title Pt to be able to walk in the house with a cane    Time 8   Period Weeks   Status On-going   PT LONG TERM GOAL #4   Title Pain level to be no greater than a 5/10 80% of the time    Time 8   Period Weeks   Status On-going               Plan - 06/30/14 1259    Clinical Impression Statement Patient arrived at session and appeared irritated with therapist, very anxious about getting handouts regarding exercises from last session. Continued with less aggressive approach towards exercise on this date, however patient did frequently disagree with therapist on form of technique and attempted to perform with impaired technique, requiring correction of exercise form. Overall patient physically tolerated session well, reports that she is walking more overall during the past few days.   Pt will benefit from skilled therapeutic intervention in order to improve on the following deficits Decreased activity tolerance;Decreased balance;Decreased coordination;Decreased mobility;Pain;Difficulty walking;Decreased strength   Rehab Potential Good   PT Frequency 3x / week   PT Duration 8 weeks   PT Treatment/Interventions ADLs/Self Care Home Management;Functional mobility training;Therapeutic activities;Therapeutic exercise;Balance training;Patient/family education   PT Next Visit Plan Continue functional weight bearing  exercises; focus on correct form    PT Home Exercise Plan given   Consulted and Agree with Plan of Care Patient        Problem  List Patient Active Problem List   Diagnosis Date Noted  . Thrombocytosis 05/21/2014  . Protein-calorie malnutrition, severe 05/21/2014  . Anemia   . Generalized weakness   . Necrotic pneumonia 05/20/2014  . Pneumonia 05/20/2014  . Elevated liver enzymes 12/16/2013  . Tremor 06/24/2013  . Encounter for therapeutic drug monitoring 05/18/2013  .  Dizziness 01/12/2013  . Wound of ankle 10/23/2010  . Long term current use of anticoagulant 07/05/2010  . Carotid bruit 11/01/2009  . HYPERLIPIDEMIA 10/26/2009  . Essential hypertension 10/26/2009  . Atrial fibrillation 10/26/2009  . ACUTE ON CHRONIC DIASTOLIC HEART FAILURE 37/01/6268  . SYNCOPE AND COLLAPSE 10/26/2009    Deniece Ree PT, DPT [place] [address] [place], [place], [address] Phone: [phone]   Fax:  [phone]

## 2014-06-30 NOTE — Patient Instructions (Addendum)
Heel Raises   Stand with support. Tighten pelvic floor and hold. With knees straight, raise heels off ground. Repeat 10-15 times; perform 1-2 times throughout the day.  Repeat ___ times. Do ___ times a day.  Copyright  VHI. All rights reserved.   Mini Squat   Place feet in line with hips. Look straight ahead, back straight. Bend knees slightly.  Repeat _10___ times. Do __1-2__ sessions per day. CAUTION: Move slowly. Wear supportive shoes as needed.  Copyright  VHI. All rights reserved.  FUNCTIONAL MOBILITY: Weight Shift   Stance: shoulder-width on floor. Stand with upright posture shift weight from side to side and then from toes to heels. Hold __2_ seconds each side. _10__ reps per set, _2__ sets per day, _7__ days per week Perform with eyes open.   Copyright  VHI. All rights reserved.   FUNCTIONAL MOBILITY: Marching - Standing   March in place by lifting left leg up, then right. Alternate. _10__ reps per set, __1-2_ sets per day, __7_ days per week Hold onto a support.  Copyright  VHI. All rights reserved.   Sit to Stand (Sitting)   Sit on chair. Tighten pelvic floor and hold. Lean forward. Stand up. Repeat _5__ times with right foot forward, 5 times with left foot forward.. Do 1-2___ times a day.  Copyright  VHI. All rights reserved.   Side Stepping   In vertical position, move sideways by side stepping one direction for approximately 10-15 feet; then side step the other direction.  Perform __1_ laps leading with left leg. Perform _1__ laps leading with right leg. Hold onto  Something with both hands while doing exercise.   Copyright  VHI. All rights reserved.

## 2014-07-01 ENCOUNTER — Encounter (INDEPENDENT_AMBULATORY_CARE_PROVIDER_SITE_OTHER): Payer: Self-pay | Admitting: *Deleted

## 2014-07-01 ENCOUNTER — Encounter (INDEPENDENT_AMBULATORY_CARE_PROVIDER_SITE_OTHER): Payer: Self-pay | Admitting: Internal Medicine

## 2014-07-01 ENCOUNTER — Ambulatory Visit (INDEPENDENT_AMBULATORY_CARE_PROVIDER_SITE_OTHER): Payer: Medicare Other | Admitting: Internal Medicine

## 2014-07-01 ENCOUNTER — Telehealth (INDEPENDENT_AMBULATORY_CARE_PROVIDER_SITE_OTHER): Payer: Self-pay | Admitting: *Deleted

## 2014-07-01 VITALS — BP 110/72 | HR 72 | Temp 98.1°F | Ht 65.0 in | Wt 100.3 lb

## 2014-07-01 DIAGNOSIS — R748 Abnormal levels of other serum enzymes: Secondary | ICD-10-CM

## 2014-07-01 NOTE — Telephone Encounter (Signed)
.  Per Terri Setzer,NP patient to have labs in 6 months. 

## 2014-07-01 NOTE — Progress Notes (Signed)
Subjective:    Patient ID: [name], female    DOB: Aug 31, 1940, 74 y.o.   MRN: [id number]  HPI 74 yr old white female here today for f/u of her elevated transaminases. Recent transaminases are normal. Please see below. Dr Wende Neighbors PCP.  She is presently taking Pravachol for her cholesterol.  She tells me she is doing good. She is in Rehab due to her back.  She has a T12 compression. Appetite is good. She has lost 20 pounds since her last visit in September. She says she lost weight due to her back.  Her weight has stabilized since January. She eats a lot of fruits and vegetables. She was in hospital in February for pneumonia.  Will be seeing a pulmonologist in [facility] next week for possible lung mass    Hepatic Function Panel     Component Value Date/Time   PROT 6.9 06/29/2014 0835   ALBUMIN 3.8 06/29/2014 0835   AST 31 06/29/2014 0835   ALT 30 06/29/2014 0835   ALKPHOS 96 06/29/2014 0835   BILITOT 0.4 06/29/2014 0835         11/04/2013 AST 108, ALT 116, ALP 113 10/2012 AST 32 ALT 43, ALP 80    11/04/2013 Hep C negative 11/16/2013 [place] RUQ: Gallbladder:  The gallbladder is adequately distended with no evidence of stones,  wall thickening, or pericholecystic fluid. A previously demonstrated  polyp is not evident today. There is no positive sonographic  Murphy's sign.  Common bile duct:  Diameter: 3.6 mm  Liver:  The liver exhibits normal echotexture with no focal mass nor ductal  dilation.  IMPRESSION:  Normal limited right upper quadrant ultrasound examination.   11/04/2013 AST 108, ALT 116, ALP 113 10/2012 AST 32 ALT 43, ALP 80        Review of Systems Past Medical History  Diagnosis Date  . Subclavian steal syndrome   . Carotid bruit     LICA 93-57% (duplex 0/17)  . Hypertension   . Hyperlipidemia   . Atrial fibrillation     x 3 yrs  . Anticoagulation management encounter   . Meningioma   . Osteoporosis   . Stroke     CVA 2008  .  Pneumonia   . Breast cancer     S/P left mastectomy, 1989 remained in remission  . Anemia     Past Surgical History  Procedure Laterality Date  . Cardiac catheterization    . Mastectomy Left 1990  . Cataract extraction Bilateral 2013  . Colonoscopy  11/17/2010    Procedure: COLONOSCOPY;  Surgeon: [name], MD;  [facility];  Service: Endoscopy;  Laterality: N/A;  10:45 am  . Esophagogastroduodenoscopy  11/17/2010    Procedure: ESOPHAGOGASTRODUODENOSCOPY (EGD);  Surgeon: [name], MD;  [facility];  Service: Endoscopy;  Laterality: N/A;  . Lumbar epidural injection  06-2012--06-2013    pt. states she has had 5 epidurals in 06-2012----06-2013  . Eye surgery      Allergies  Allergen Reactions  . Iodine Rash and Other (See Comments)    BETADINE Rash/burning, blisters on skin.  . Iohexol Rash and Other (See Comments)    Blisters; PT NEEDS 13-HOUR PREP   . Tape Itching and Rash    Prefers PAPER TAPE    Current Outpatient Prescriptions on File Prior to Visit  Medication Sig Dispense Refill  . acetaminophen (TYLENOL) 500 MG tablet Take 1,000 mg by mouth every 6 (six) hours as  needed for mild pain or moderate pain.     Marland Kitchen alendronate (FOSAMAX) 70 MG tablet Take 70 mg by mouth every Sunday. Take in the morning with a full glass of water, on an empty stomach, and do not take anything else by mouth or lie down for the next 30 min. Patient take on Sunday    . amiodarone (PACERONE) 200 MG tablet Take 1 tablet by mouth  daily (Patient taking differently: Take 1 tablet by mouth  daily in the morning) 30 tablet 6  . amLODipine (NORVASC) 10 MG tablet Take 10 mg by mouth daily.    Marland Kitchen aspirin EC 81 MG tablet Take 81 mg by mouth every morning.     . calcium-vitamin D (OSCAL) 250-125 MG-UNIT per tablet Take 1 tablet by mouth 2 (two) times daily.      . clopidogrel (PLAVIX) 75 MG tablet Take 1 tablet by mouth  every morning 90 tablet 0  . irbesartan (AVAPRO) 300 MG  tablet Take 300 mg by mouth daily.    . Iron-FA-B Cmp-C-Biot-Probiotic (FUSION PLUS) CAPS Take 1 capsule by mouth every morning.     . magnesium oxide (MAG-OX) 400 MG tablet Take 400 mg by mouth every morning.     . meloxicam (MOBIC) 15 MG tablet Take 15 mg by mouth every morning.      . metoprolol (LOPRESSOR) 50 MG tablet Take 1 tablet (50 mg total) by mouth 2 (two) times daily. (Patient taking differently: Take 75 mg by mouth 2 (two) times daily. ) 60 tablet 0  . Multiple Vitamin (MULTIVITAMIN WITH MINERALS) TABS tablet Take 1 tablet by mouth every morning.    . Omega-3 Fatty Acids (FISH OIL) 1200 MG CAPS Take 1,200 mg by mouth 2 (two) times daily.    . pravastatin (PRAVACHOL) 40 MG tablet Take 1 tablet by mouth  every night at bedtime 90 tablet 1  . PROAIR HFA 108 (90 BASE) MCG/ACT inhaler Inhale 2 puffs into the lungs as needed.    . vitamin C (ASCORBIC ACID) 500 MG tablet Take 500 mg by mouth every morning.     . warfarin (COUMADIN) 1 MG tablet Take 1 tablet (1 mg total) by mouth one time only at 6 PM. (Patient taking differently: Take 2-4 mg by mouth every evening. 2mg  on all days except Mondays. Take 4mg  on Mondays) 90 tablet 1  . WELCHOL 625 MG tablet Take 3 tablets by mouth  twice a day with a meal 180 tablet 3  . oxyCODONE-acetaminophen (PERCOCET) 7.5-325 MG per tablet Take 0.5 tablets by mouth at bedtime as needed. For pain     No current facility-administered medications on file prior to visit.         Objective:   Physical ExamBlood pressure 110/72, pulse 72, temperature 98.1 F (36.7 C), height 5\' 5"  (1.651 m), weight 100 lb 4.8 oz (45.496 kg).  Alert and oriented. Skin warm and dry. Oral mucosa is moist.   . Sclera anicteric, conjunctivae is pink. Thyroid not enlarged. No cervical lymphadenopathy. Lungs clear. Heart regular rate and rhythm.  Abdomen is soft. Bowel sounds are positive. No hepatomegaly. No abdominal masses felt. No tenderness.  No edema to lower extremities.  Patient is alert and oriented.       Assessment & Plan:  Transaminases are normal. [place] normal.  Will see back in 6 months.

## 2014-07-01 NOTE — Patient Instructions (Signed)
OV in 6 months. 

## 2014-07-02 ENCOUNTER — Ambulatory Visit (HOSPITAL_COMMUNITY): Payer: Medicare Other | Admitting: Physical Therapy

## 2014-07-02 DIAGNOSIS — M6281 Muscle weakness (generalized): Secondary | ICD-10-CM | POA: Diagnosis not present

## 2014-07-02 DIAGNOSIS — R29898 Other symptoms and signs involving the musculoskeletal system: Secondary | ICD-10-CM

## 2014-07-02 DIAGNOSIS — R2689 Other abnormalities of gait and mobility: Secondary | ICD-10-CM

## 2014-07-02 DIAGNOSIS — R278 Other lack of coordination: Secondary | ICD-10-CM | POA: Diagnosis not present

## 2014-07-02 DIAGNOSIS — R262 Difficulty in walking, not elsewhere classified: Secondary | ICD-10-CM

## 2014-07-02 NOTE — Therapy (Signed)
[place], [place], [address] Phone: [phone]   Fax:  [phone]  Physical Therapy Treatment  Patient Details  Name: [name] MRN: [id number] Date of Birth: 1941/02/25 Referring Provider:  Sanjuana Kava, MD  Encounter Date: 07/02/2014      PT End of Session - 07/02/14 1025    Visit Number 24   Number of Visits 24   Date for PT Re-Evaluation 07/14/14   Authorization Type medicare   Authorization Time Period G-code done on 06/17/14   Authorization - Visit Number 24   Authorization - Number of Visits 30   Activity Tolerance Patient tolerated treatment well   Behavior During Therapy [facility] for tasks assessed/performed      Past Medical History  Diagnosis Date  . Subclavian steal syndrome   . Carotid bruit     LICA 31-51% (duplex 7/61)  . Hypertension   . Hyperlipidemia   . Atrial fibrillation     x 3 yrs  . Anticoagulation management encounter   . Meningioma   . Osteoporosis   . Stroke     CVA 2008  . Pneumonia   . Breast cancer     S/P left mastectomy, 1989 remained in remission  . Anemia     Past Surgical History  Procedure Laterality Date  . Cardiac catheterization    . Mastectomy Left 1990  . Cataract extraction Bilateral 2013  . Colonoscopy  11/17/2010    Procedure: COLONOSCOPY;  Surgeon: [name], MD;  [facility];  Service: Endoscopy;  Laterality: N/A;  10:45 am  . Esophagogastroduodenoscopy  11/17/2010    Procedure: ESOPHAGOGASTRODUODENOSCOPY (EGD);  Surgeon: [name], MD;  [facility];  Service: Endoscopy;  Laterality: N/A;  . Lumbar epidural injection  06-2012--06-2013    pt. states she has had 5 epidurals in 06-2012----06-2013  . Eye surgery      There were no vitals filed for this visit.  Visit Diagnosis:  Leg weakness, bilateral  Difficulty walking  Poor balance      Subjective Assessment - 07/02/14 0840    Symptoms Patient  continues to be somewhat irritable, stating "this is a waste of time, you're supposed to wash your hands" when asked to use alcohol rub on hands. States she did a lot the past couple days.    Pertinent History Pt states that she was on her back for about six weeks and has lost a lot of her strength.    Pain Score 5    Pain Location Leg  bilateral legs; pt says back feels fine today                       [facility] Adult PT Treatment/Exercise - 07/02/14 0001    Ambulation/Gait   Gait Comments Gait approximately 161ft, 284ft, 126ft holding onto wheelchair   Lumbar Exercises: Standing   Heel Raises 15 reps   Heel Raises Limitations standing   Other Standing Lumbar Exercises Sit to stand with staggered stance 1x10; sit to stand with 3 mini-squat pulses 1x5; standing marches 1x10 with cues for form   Other Standing Lumbar Exercises Side-stepping and retro-gait 2x75ft.    Lumbar Exercises: Seated   Other Seated Lumbar Exercises Side to sides with elbow touching mat 1x10 each way for improved core strength   Other Seated Lumbar Exercises Pelvic walks at edge of mat table  PT Education - 07/02/14 1025    Education provided Yes   Education Details Safety of exercise program at home in order to reduce falls; form during functional exercises during session   Person(s) Educated Patient   Methods Explanation   Comprehension Verbalized understanding          PT Short Term Goals - 06/23/14 0942    PT SHORT TERM GOAL #1   Title I in HEP   Time 1   Period Weeks   Status Achieved   PT SHORT TERM GOAL #2   Title Pt to be able to stand for two minutes   Time 4   Period Weeks   Status On-going   PT SHORT TERM GOAL #3   Title Pt to be able to walk behind wheelchair for five minues   Baseline 3/16- approximately 2 minutes   Time 4   Period Weeks   Status On-going   PT SHORT TERM GOAL #4   Title Pain level to be no greater than a 7/10   Baseline 3/16- 9/10    Status On-going           PT Long Term Goals - 06/23/14 0943    PT LONG TERM GOAL #1   Title I in advance HEP   Time 8   Period Weeks   Status On-going   PT LONG TERM GOAL #2   Title 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   Time 8   Period Weeks   Status On-going   PT LONG TERM GOAL #3   Title Pt to be able to walk in the house with a cane    Time 8   Period Weeks   Status On-going   PT LONG TERM GOAL #4   Title Pain level to be no greater than a 5/10 80% of the time    Time 8   Period Weeks   Status On-going               Plan - 07/02/14 1025    Clinical Impression Statement Patient arrived at session slightly irritable but appeared to improve in mood today as session progressed. Continued focus on functional standing and core exercises. Intermittent correction of exercise form. Overall patient tolerated session well but did state that her legs were continuing to feel pretty sore after what she did at home the other day. Frequent rest breaks provided throughout session.   Pt will benefit from skilled therapeutic intervention in order to improve on the following deficits Decreased activity tolerance;Decreased balance;Decreased coordination;Decreased mobility;Pain;Difficulty walking;Decreased strength   Rehab Potential Good   PT Frequency 3x / week   PT Duration 8 weeks   PT Treatment/Interventions ADLs/Self Care Home Management;Functional mobility training;Therapeutic activities;Therapeutic exercise;Balance training;Patient/family education   PT Next Visit Plan Continue functional weight bearing  exercises; focus on correct form    PT Home Exercise Plan given   Consulted and Agree with Plan of Care Patient        Problem List Patient Active Problem List   Diagnosis Date Noted  . Thrombocytosis 05/21/2014  . Protein-calorie malnutrition, severe 05/21/2014  . Anemia   . Generalized weakness   . Necrotic pneumonia 05/20/2014  .  Pneumonia 05/20/2014  . Elevated liver enzymes 12/16/2013  . Tremor 06/24/2013  . Encounter for therapeutic drug monitoring 05/18/2013  . Dizziness 01/12/2013  . Wound of ankle 10/23/2010  . Long term current use of anticoagulant 07/05/2010  .  Carotid bruit 11/01/2009  . HYPERLIPIDEMIA 10/26/2009  . Essential hypertension 10/26/2009  . Atrial fibrillation 10/26/2009  . ACUTE ON CHRONIC DIASTOLIC HEART FAILURE 45/40/9811  . SYNCOPE AND COLLAPSE 10/26/2009    Deniece Ree PT, DPT [place] [address] [place], [place], [address] Phone: [phone]   Fax:  [phone]

## 2014-07-05 ENCOUNTER — Ambulatory Visit (HOSPITAL_COMMUNITY): Payer: Medicare Other | Admitting: Physical Therapy

## 2014-07-05 ENCOUNTER — Ambulatory Visit (INDEPENDENT_AMBULATORY_CARE_PROVIDER_SITE_OTHER): Payer: Medicare Other | Admitting: *Deleted

## 2014-07-05 DIAGNOSIS — I480 Paroxysmal atrial fibrillation: Secondary | ICD-10-CM | POA: Diagnosis not present

## 2014-07-05 DIAGNOSIS — R262 Difficulty in walking, not elsewhere classified: Secondary | ICD-10-CM

## 2014-07-05 DIAGNOSIS — Z5181 Encounter for therapeutic drug level monitoring: Secondary | ICD-10-CM | POA: Diagnosis not present

## 2014-07-05 DIAGNOSIS — M6281 Muscle weakness (generalized): Secondary | ICD-10-CM | POA: Diagnosis not present

## 2014-07-05 DIAGNOSIS — Z6820 Body mass index (BMI) 20.0-20.9, adult: Secondary | ICD-10-CM | POA: Diagnosis not present

## 2014-07-05 DIAGNOSIS — S22089D Unspecified fracture of T11-T12 vertebra, subsequent encounter for fracture with routine healing: Secondary | ICD-10-CM | POA: Diagnosis not present

## 2014-07-05 DIAGNOSIS — R2689 Other abnormalities of gait and mobility: Secondary | ICD-10-CM

## 2014-07-05 DIAGNOSIS — I10 Essential (primary) hypertension: Secondary | ICD-10-CM | POA: Diagnosis not present

## 2014-07-05 DIAGNOSIS — I4891 Unspecified atrial fibrillation: Secondary | ICD-10-CM

## 2014-07-05 DIAGNOSIS — R29898 Other symptoms and signs involving the musculoskeletal system: Secondary | ICD-10-CM

## 2014-07-05 DIAGNOSIS — R278 Other lack of coordination: Secondary | ICD-10-CM | POA: Diagnosis not present

## 2014-07-05 LAB — POCT INR: INR: 2.1

## 2014-07-05 NOTE — Therapy (Signed)
[place], [place], [address] Phone: [phone]   Fax:  [phone]  Physical Therapy Treatment  Patient Details  Name: [name] MRN: [id number] Date of Birth: Mar 21, 1941 Referring Provider:  Delphina Cahill, MD  Encounter Date: 07/05/2014      PT End of Session - 07/05/14 1116    Visit Number 25   Number of Visits 30   Date for PT Re-Evaluation 07/14/14   Authorization Type medicare   Authorization Time Period G-code done on 06/17/14   Authorization - Visit Number 25   Authorization - Number of Visits 30   PT Start Time 0804   PT Stop Time 0850   PT Time Calculation (min) 46 min   Equipment Utilized During Treatment Gait belt   Activity Tolerance Patient tolerated treatment well   Behavior During Therapy [facility] for tasks assessed/performed      Past Medical History  Diagnosis Date  . Subclavian steal syndrome   . Carotid bruit     LICA 66-44% (duplex 0/34)  . Hypertension   . Hyperlipidemia   . Atrial fibrillation     x 3 yrs  . Anticoagulation management encounter   . Meningioma   . Osteoporosis   . Stroke     CVA 2008  . Pneumonia   . Breast cancer     S/P left mastectomy, 1989 remained in remission  . Anemia     Past Surgical History  Procedure Laterality Date  . Cardiac catheterization    . Mastectomy Left 1990  . Cataract extraction Bilateral 2013  . Colonoscopy  11/17/2010    Procedure: COLONOSCOPY;  Surgeon: [name], MD;  [facility];  Service: Endoscopy;  Laterality: N/A;  10:45 am  . Esophagogastroduodenoscopy  11/17/2010    Procedure: ESOPHAGOGASTRODUODENOSCOPY (EGD);  Surgeon: [name], MD;  Location: [facility];  Service: Endoscopy;  Laterality: N/A;  . Lumbar epidural injection  06-2012--06-2013    pt. states she has had 5 epidurals in 06-2012----06-2013  . Eye surgery      There were no vitals filed for this visit.  Visit Diagnosis:  Leg  weakness, bilateral  Difficulty walking  Poor balance      Subjective Assessment - 07/05/14 1113    Symptoms PT is doing her exercises at home but has not tried to complete functional tasks such as ironing a shirt.    Pertinent History Pt states that she was on her back for about six weeks and has lost a lot of her strength.    How long can you sit comfortably? Pt is able to sit for an hour but this causes her pain was 30 minutes    How long can you stand comfortably? Pt is able to stand for four minutes was 90 seconds one month ago and unabel at inital evaluation.    How long can you walk comfortably? Able gto walk for 226 ft pushing wheelchair was 85 ft one month ago and 30 ft initally    Patient Stated Goals To be able to stand up to iron even if it is only two shirts , walk and drive    Currently in Pain? Yes   Pain Score 5    Pain Location Leg   Pain Orientation Left   Pain Descriptors / Indicators Constant   Pain Type Chronic pain            [facility] PT Assessment - 07/05/14 0001  Assessment   Medical Diagnosis compression fx   Onset Date 01/07/14   Prior Therapy OP   Precautions   Precautions Back   Restrictions   Weight Bearing Restrictions No   Balance Screen   Has the patient fallen in the past 6 months Yes   How many times? 1   Has the patient had a decrease in activity level because of a fear of falling?  Yes   Is the patient reluctant to leave their home because of a fear of falling?  Yes   Prior Function   Level of Independence Independent with basic ADLs   Cognition   Overall Cognitive Status Within Functional Limits for tasks assessed   Observation/Other Assessments   Focus on Therapeutic Outcomes (FOTO)  73% was 58   Strength   Right Hip Flexion 3+/5   Right Hip Extension 3+/5  was 2+   Right Hip ABduction 2/5  severe muscle soreness   Left Hip Flexion 3+/5   Left Hip Extension 3/5  was 2/5   Left Hip ABduction 2/5  severe muscle soreness today    Right Knee Flexion 4/5  was 3+5    Right Knee Extension 4/5   Left Knee Flexion 3+/5   Right Ankle Dorsiflexion 3-/5  was 2/5   Left Ankle Dorsiflexion 2+/5  AFO in place- MMT grade adjusted to account for brace             [facility] Adult PT Treatment/Exercise - 07/05/14 0001    Ambulation/Gait   Gait Comments 6 Minute  Walk Test 286 ft totat; 1 rest breaks provided after 226' .    Lumbar Exercises: Standing   Other Standing Lumbar Exercises sit to stand x 10; 3 D hip excurson x 10; side stepping aroung mat x 1 RT   Lumbar Exercises: Prone   Straight Leg Raise 10 reps   Other Prone Lumbar Exercises ham curls x 10             PT Short Term Goals - 07/05/14 1120    PT SHORT TERM GOAL #1   Title I in HEP   Time 1   Period Weeks   Status Achieved   PT SHORT TERM GOAL #2   Title Pt to be able to stand for two minutes   Baseline 07/05/14 able to stand for four minutes   Time 4   Period Weeks   Status Achieved   PT SHORT TERM GOAL #3   Title Pt to be able to walk behind wheelchair for five minues   Baseline 07/05/2014 -able to walk for five minutes if cued to slow down to preserve energy   Time 4   Period Weeks   Status Achieved   PT SHORT TERM GOAL #4   Title Pain level to be no greater than a 7/10   Baseline 3/16- 9/10   Time 4   Period Weeks   Status On-going           PT Long Term Goals - 07/05/14 1121    PT LONG TERM GOAL #1   Title I in advance HEP   Time 8   Period Weeks   Status Achieved   PT LONG TERM GOAL #2   Title 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   Time 8   Period Weeks   Status On-going   PT LONG TERM GOAL #3   Title Pt to be able to walk in  the house with a cane    Time 8   Period Weeks   Status On-going   PT LONG TERM GOAL #4   Title Pain level to be no greater than a 5/10 80% of the time    Time 8   Period Weeks   Status On-going               Plan - 07/05/14 1117    Clinical Impression  Statement Pt has shown significant functional improvement.  Pt was encouraged to transition this to home IE stand to iron shirts as this was one of her goals.  Pt continues to be able to tolerate increased weight bearing but still has weakened  hip mm.  Pt was encouraged to complete prone exercises at home as she currently is not doing prone exercises.   Pt will benefit from skilled therapeutic intervention in order to improve on the following deficits Decreased activity tolerance;Decreased balance;Decreased coordination;Decreased mobility;Pain;Difficulty walking;Decreased strength   Rehab Potential Good   PT Next Visit Plan attempt gt training with quad cane for short distances.         Problem List Patient Active Problem List   Diagnosis Date Noted  . Thrombocytosis 05/21/2014  . Protein-calorie malnutrition, severe 05/21/2014  . Anemia   . Generalized weakness   . Necrotic pneumonia 05/20/2014  . Pneumonia 05/20/2014  . Elevated liver enzymes 12/16/2013  . Tremor 06/24/2013  . Encounter for therapeutic drug monitoring 05/18/2013  . Dizziness 01/12/2013  . Wound of ankle 10/23/2010  . Long term current use of anticoagulant 07/05/2010  . Carotid bruit 11/01/2009  . HYPERLIPIDEMIA 10/26/2009  . Essential hypertension 10/26/2009  . Atrial fibrillation 10/26/2009  . ACUTE ON CHRONIC DIASTOLIC HEART FAILURE 62/22/9798  . SYNCOPE AND COLLAPSE 10/26/2009   Rayetta Humphrey PT [phone] 07/05/2014, 11:24 AM  [place], [place], [address] Phone: [phone]   Fax:  [phone]

## 2014-07-06 ENCOUNTER — Other Ambulatory Visit (HOSPITAL_COMMUNITY): Payer: Medicare Other

## 2014-07-07 ENCOUNTER — Ambulatory Visit (HOSPITAL_COMMUNITY): Payer: Medicare Other | Admitting: Physical Therapy

## 2014-07-07 ENCOUNTER — Ambulatory Visit (HOSPITAL_COMMUNITY): Payer: Medicare Other

## 2014-07-07 DIAGNOSIS — R278 Other lack of coordination: Secondary | ICD-10-CM | POA: Diagnosis not present

## 2014-07-07 DIAGNOSIS — R2689 Other abnormalities of gait and mobility: Secondary | ICD-10-CM

## 2014-07-07 DIAGNOSIS — R262 Difficulty in walking, not elsewhere classified: Secondary | ICD-10-CM

## 2014-07-07 DIAGNOSIS — M6281 Muscle weakness (generalized): Secondary | ICD-10-CM | POA: Diagnosis not present

## 2014-07-07 DIAGNOSIS — R29898 Other symptoms and signs involving the musculoskeletal system: Secondary | ICD-10-CM

## 2014-07-07 NOTE — Therapy (Signed)
[place], [place], [address] Phone: [phone]   Fax:  [phone]  Physical Therapy Treatment  Patient Details  Name: [name] MRN: [id number] Date of Birth: 01-29-1941 Referring Provider:  Sanjuana Kava, MD  Encounter Date: 07/07/2014      PT End of Session - 07/07/14 1313    Visit Number 26   Number of Visits 30   Date for PT Re-Evaluation 07/14/14   Authorization Type medicare   Authorization Time Period G-code done on 06/17/14 (19th visit)   Authorization - Visit Number 26   Authorization - Number of Visits 30   PT Start Time 0845   PT Stop Time 0927   PT Time Calculation (min) 42 min   Activity Tolerance Patient tolerated treatment well   Behavior During Therapy [facility] for tasks assessed/performed      Past Medical History  Diagnosis Date  . Subclavian steal syndrome   . Carotid bruit     LICA 16-01% (duplex 0/93)  . Hypertension   . Hyperlipidemia   . Atrial fibrillation     x 3 yrs  . Anticoagulation management encounter   . Meningioma   . Osteoporosis   . Stroke     CVA 2008  . Pneumonia   . Breast cancer     S/P left mastectomy, 1989 remained in remission  . Anemia     Past Surgical History  Procedure Laterality Date  . Cardiac catheterization    . Mastectomy Left 1990  . Cataract extraction Bilateral 2013  . Colonoscopy  11/17/2010    Procedure: COLONOSCOPY;  Surgeon: [name], MD;  [facility];  Service: Endoscopy;  Laterality: N/A;  10:45 am  . Esophagogastroduodenoscopy  11/17/2010    Procedure: ESOPHAGOGASTRODUODENOSCOPY (EGD);  Surgeon: [name], MD;  [facility];  Service: Endoscopy;  Laterality: N/A;  . Lumbar epidural injection  06-2012--06-2013    pt. states she has had 5 epidurals in 06-2012----06-2013  . Eye surgery      There were no vitals filed for this visit.  Visit Diagnosis:  Leg weakness, bilateral  Difficulty  walking  Poor balance      Subjective Assessment - 07/07/14 1306    Symptoms Patient states she is continuing to feel good, some slight pain in her thighs but no major; states she was able to stand for as much as 5 minutes recently.    Pertinent History Pt states that she was on her back for about six weeks and has lost a lot of her strength.    Currently in Pain? Yes   Pain Score 3    Pain Location --  bilateral legs                       [facility] Adult PT Treatment/Exercise - 07/07/14 0001    Ambulation/Gait   Ambulation/Gait Yes   Ambulation Distance (Feet) --  124ft, 171ft pushing WC; 53ft x 4/48ft x2 and WBQC   Ambulation Surface Level   Gait Comments Cues for sequencing with quad cane, reduced gait speed, reduced bilateral step lengths, flexed posture   Lumbar Exercises: Standing   Forward Lunge 10 reps   Forward Lunge Limitations toe taps on 6 inch box with wide based quad cane;   Other Standing Lumbar Exercises sit to stand with staggered stance 1x7 each side    Other Standing Lumbar Exercises Forward and backwards stepping with wide  base quad cane   Lumbar Exercises: Supine   Bridge 20 reps   Bridge Limitations standard   Straight Leg Raise 20 reps   Straight Leg Raises Limitations standard   Lumbar Exercises: Sidelying   Hip Abduction 15 reps   Hip Abduction Limitations cues for proper performance                PT Education - 07/07/14 1312    Education provided Yes   Education Details Education regarding sequencing during gait with quad cane; patient instructed not to attempt gait at home with cane until further gait training has occurred with cane, and asked patient to bring in her personal cane next session   Person(s) Educated Patient   Methods Explanation   Comprehension Verbalized understanding          PT Short Term Goals - 07/05/14 1120    PT SHORT TERM GOAL #1   Title I in HEP   Time 1   Period Weeks   Status Achieved   PT  SHORT TERM GOAL #2   Title Pt to be able to stand for two minutes   Baseline 07/05/14 able to stand for four minutes   Time 4   Period Weeks   Status Achieved   PT SHORT TERM GOAL #3   Title Pt to be able to walk behind wheelchair for five minues   Baseline 07/05/2014 -able to walk for five minutes if cued to slow down to preserve energy   Time 4   Period Weeks   Status Achieved   PT SHORT TERM GOAL #4   Title Pain level to be no greater than a 7/10   Baseline 3/16- 9/10   Time 4   Period Weeks   Status On-going           PT Long Term Goals - 07/05/14 1121    PT LONG TERM GOAL #1   Title I in advance HEP   Time 8   Period Weeks   Status Achieved   PT LONG TERM GOAL #2   Title 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   Time 8   Period Weeks   Status On-going   PT LONG TERM GOAL #3   Title Pt to be able to walk in the house with a cane    Time 8   Period Weeks   Status On-going   PT LONG TERM GOAL #4   Title Pain level to be no greater than a 5/10 80% of the time    Time 8   Period Weeks   Status On-going               Plan - 07/07/14 1314    Clinical Impression Statement Introduced gait training with quad cane; also incorporated standing exercises with quad cane today along with other functional exercises in sitting and on mat table. Patient requires cues for proper use of cane but overall performed well with new AD. Shows signfiicant improvement in activity tolerance today, however continues to demonstrate proximal weakness.    Pt will benefit from skilled therapeutic intervention in order to improve on the following deficits Decreased activity tolerance;Decreased balance;Decreased coordination;Decreased mobility;Pain;Difficulty walking;Decreased strength   Rehab Potential Good   PT Frequency 3x / week   PT Duration 8 weeks   PT Treatment/Interventions ADLs/Self Care Home Management;Functional mobility training;Therapeutic  activities;Therapeutic exercise;Balance training;Patient/family education   PT Next Visit Plan Continue gait training with  quad cane and patient's personal cane; prone exercises; activity tolerance   PT Home Exercise Plan given   Consulted and Agree with Plan of Care Patient        Problem List Patient Active Problem List   Diagnosis Date Noted  . Thrombocytosis 05/21/2014  . Protein-calorie malnutrition, severe 05/21/2014  . Anemia   . Generalized weakness   . Necrotic pneumonia 05/20/2014  . Pneumonia 05/20/2014  . Elevated liver enzymes 12/16/2013  . Tremor 06/24/2013  . Encounter for therapeutic drug monitoring 05/18/2013  . Dizziness 01/12/2013  . Wound of ankle 10/23/2010  . Long term current use of anticoagulant 07/05/2010  . Carotid bruit 11/01/2009  . HYPERLIPIDEMIA 10/26/2009  . Essential hypertension 10/26/2009  . Atrial fibrillation 10/26/2009  . ACUTE ON CHRONIC DIASTOLIC HEART FAILURE 45/06/8880  . SYNCOPE AND COLLAPSE 10/26/2009    Deniece Ree PT, DPT [place] [address] [place], [place], [address] Phone: [phone]   Fax:  [phone]

## 2014-07-08 ENCOUNTER — Ambulatory Visit (HOSPITAL_COMMUNITY)
Admission: RE | Admit: 2014-07-08 | Discharge: 2014-07-08 | Disposition: A | Discharge status: Home / Self Care | Payer: Medicare Other | Source: Ambulatory Visit | Attending: Internal Medicine | Admitting: Internal Medicine

## 2014-07-08 DIAGNOSIS — K449 Diaphragmatic hernia without obstruction or gangrene: Secondary | ICD-10-CM | POA: Diagnosis not present

## 2014-07-08 DIAGNOSIS — R911 Solitary pulmonary nodule: Secondary | ICD-10-CM | POA: Insufficient documentation

## 2014-07-08 DIAGNOSIS — Z853 Personal history of malignant neoplasm of breast: Secondary | ICD-10-CM | POA: Diagnosis not present

## 2014-07-08 DIAGNOSIS — Z87891 Personal history of nicotine dependence: Secondary | ICD-10-CM | POA: Diagnosis not present

## 2014-07-08 DIAGNOSIS — J189 Pneumonia, unspecified organism: Secondary | ICD-10-CM | POA: Diagnosis not present

## 2014-07-08 MED ORDER — IOHEXOL 300 MG/ML  SOLN
80.0000 mL | Freq: Once | INTRAMUSCULAR | Status: AC | PRN
  Administered 2014-07-08: 80 mL via INTRAVENOUS

## 2014-07-09 ENCOUNTER — Ambulatory Visit (INDEPENDENT_AMBULATORY_CARE_PROVIDER_SITE_OTHER): Payer: Medicare Other | Admitting: Internal Medicine

## 2014-07-09 ENCOUNTER — Ambulatory Visit (HOSPITAL_COMMUNITY): Payer: Medicare Other | Attending: Orthopaedic Surgery | Admitting: Physical Therapy

## 2014-07-09 ENCOUNTER — Encounter: Payer: Self-pay | Admitting: Internal Medicine

## 2014-07-09 VITALS — BP 136/74 | HR 73 | Ht 65.0 in | Wt 102.0 lb

## 2014-07-09 DIAGNOSIS — R278 Other lack of coordination: Secondary | ICD-10-CM | POA: Diagnosis not present

## 2014-07-09 DIAGNOSIS — J189 Pneumonia, unspecified organism: Secondary | ICD-10-CM | POA: Diagnosis not present

## 2014-07-09 DIAGNOSIS — R262 Difficulty in walking, not elsewhere classified: Secondary | ICD-10-CM | POA: Insufficient documentation

## 2014-07-09 DIAGNOSIS — R918 Other nonspecific abnormal finding of lung field: Secondary | ICD-10-CM | POA: Diagnosis not present

## 2014-07-09 DIAGNOSIS — M6281 Muscle weakness (generalized): Secondary | ICD-10-CM | POA: Diagnosis not present

## 2014-07-09 DIAGNOSIS — J984 Other disorders of lung: Secondary | ICD-10-CM | POA: Diagnosis not present

## 2014-07-09 DIAGNOSIS — R2689 Other abnormalities of gait and mobility: Secondary | ICD-10-CM

## 2014-07-09 DIAGNOSIS — R29898 Other symptoms and signs involving the musculoskeletal system: Secondary | ICD-10-CM

## 2014-07-09 NOTE — Progress Notes (Signed)
Subjective:    Patient ID: [name], female    DOB: November 17, 1940, 74 y.o.   MRN: [id number] PCP Delphina Cahill, MD REferrd by dr Johnsie Cancel HPI  IOV 07/09/2014  Chief Complaint  Patient presents with  . Pulmonary Consult    Pt referred by [name] fpr abnormal CT. Pt denies SOB, cough, and CP/tightness. Pt stated she went to hospital for pna on 05/20/14.     74 year old female with significant neuromuscular disability due to prior stroke, spinal issues. Previous greater than 50 pack smoker. Was admitted around 05/20/2014 with significant right lower lobe pneumonia/huge masslike appearance on CT scan of the chest. Apparently she was extensively asymptomatic at this point in time. Treated with antibiotics. She had follow-up CT scan of the chest which shows significant improvement in the right lower lobe opacity but with a residual 5.2 cm thick-walled cavitary lesion. There for she's here for follow-up. At baseline she reports no dyspnea but then she is extremely disabled and is only able to move a little bit with pushing a wheelchair and this is because of a spinal issues. She only rarely feels short of breath and manages with pro-air when necessary. She's never had lung function test in the past. She denies any cough. Currently she feels better   CT chest 07/05/14 IMPRESSION: 1. Significant improvement in the severity and extent of opacity right lower lobe. A 5.2 thick walled irregular cavitary lesion persists however. Findings are most suggestive of resolving pneumonia with necrosis and peripheral atelectasis. However malignancy is still not entirely excluded. Followup CT scan in about 1 month could be considered to confirm further interval resolution. Alternatively, CT-guided biopsy could be considered as well.   2. Stable 4 mm left pulmonary nodule. If the patient is at high risk for bronchogenic carcinoma, follow-up chest CT at 1 year from initial study is recommended. If the patient is  at low risk, no follow-up is needed. This recommendation follows the consensus statement: Guidelines for Management of Small Pulmonary Nodules Detected on CT Scans: A Statement from the [employer] as published in Radiology 2005; 237:395-400.  3. Moderate hiatal hernia    has a past medical history of Subclavian steal syndrome; Carotid bruit; Hypertension; Hyperlipidemia; Atrial fibrillation; Anticoagulation management encounter; Meningioma; Osteoporosis; Stroke; Pneumonia; Breast cancer; and Anemia.   reports that she quit smoking about 7 years ago. Her smoking use included Cigarettes. She has a 56 pack-year smoking history. She has never used smokeless tobacco.  Past Surgical History  Procedure Laterality Date  . Cardiac catheterization    . Mastectomy Left 1990  . Cataract extraction Bilateral 2013  . Colonoscopy  11/17/2010    Procedure: COLONOSCOPY;  Surgeon: [name], MD;  [facility];  Service: Endoscopy;  Laterality: N/A;  10:45 am  . Esophagogastroduodenoscopy  11/17/2010    Procedure: ESOPHAGOGASTRODUODENOSCOPY (EGD);  Surgeon: [name], MD;  [facility]: [facility];  Service: Endoscopy;  Laterality: N/A;  . Lumbar epidural injection  06-2012--06-2013    pt. states she has had 5 epidurals in 06-2012----06-2013  . Eye surgery      Allergies  Allergen Reactions  . Iodine Rash and Other (See Comments)    BETADINE Rash/burning, blisters on skin.  . Iohexol Rash and Other (See Comments)    Blisters; PT NEEDS 13-HOUR PREP   . Tape Itching and Rash    Prefers PAPER TAPE    Immunization History  Administered Date(s) Administered  . Influenza Split 01/07/2014  .  Influenza-Unspecified 01/07/2013  . Pneumococcal Conjugate-13 01/07/2014  . Tdap 08/01/2011    Family History  Problem Relation Age of Onset  . Alzheimer's disease Mother   . Dementia Mother   . Hypertension Mother   . Parkinsonism Father      Current outpatient  prescriptions:  .  alendronate (FOSAMAX) 70 MG tablet, Take 70 mg by mouth every Sunday. Take in the morning with a full glass of water, on an empty stomach, and do not take anything else by mouth or lie down for the next 30 min. Patient take on Sunday, Disp: , Rfl:  .  amiodarone (PACERONE) 200 MG tablet, Take 1 tablet by mouth  daily (Patient taking differently: Take 1 tablet by mouth  daily in the morning), Disp: 30 tablet, Rfl: 6 .  amLODipine (NORVASC) 10 MG tablet, Take 10 mg by mouth daily., Disp: , Rfl:  .  aspirin EC 81 MG tablet, Take 81 mg by mouth every morning. , Disp: , Rfl:  .  calcium-vitamin D (OSCAL) 250-125 MG-UNIT per tablet, Take 1 tablet by mouth 2 (two) times daily.  , Disp: , Rfl:  .  clopidogrel (PLAVIX) 75 MG tablet, Take 1 tablet by mouth  every morning, Disp: 90 tablet, Rfl: 0 .  HYDROcodone-acetaminophen (NORCO/VICODIN) 5-325 MG per tablet, Take 1 tablet by mouth every 6 (six) hours as needed for moderate pain., Disp: , Rfl:  .  irbesartan (AVAPRO) 300 MG tablet, Take 300 mg by mouth daily., Disp: , Rfl:  .  Iron-FA-B Cmp-C-Biot-Probiotic (FUSION PLUS) CAPS, Take 1 capsule by mouth every morning. , Disp: , Rfl:  .  magnesium oxide (MAG-OX) 400 MG tablet, Take 400 mg by mouth every morning. , Disp: , Rfl:  .  meloxicam (MOBIC) 15 MG tablet, Take 15 mg by mouth every morning.  , Disp: , Rfl:  .  metoprolol (LOPRESSOR) 50 MG tablet, Take 1 tablet (50 mg total) by mouth 2 (two) times daily. (Patient taking differently: Take 75 mg by mouth 2 (two) times daily. ), Disp: 60 tablet, Rfl: 0 .  Multiple Vitamin (MULTIVITAMIN WITH MINERALS) TABS tablet, Take 1 tablet by mouth every morning., Disp: , Rfl:  .  Omega-3 Fatty Acids (FISH OIL) 1200 MG CAPS, Take 1,200 mg by mouth 2 (two) times daily., Disp: , Rfl:  .  oxyCODONE-acetaminophen (PERCOCET) 7.5-325 MG per tablet, Take 0.5 tablets by mouth at bedtime as needed. For pain, Disp: , Rfl:  .  pravastatin (PRAVACHOL) 40 MG tablet,  Take 1 tablet by mouth  every night at bedtime, Disp: 90 tablet, Rfl: 1 .  PROAIR HFA 108 (90 BASE) MCG/ACT inhaler, Inhale 2 puffs into the lungs as needed., Disp: , Rfl:  .  vitamin C (ASCORBIC ACID) 500 MG tablet, Take 500 mg by mouth every morning. , Disp: , Rfl:  .  warfarin (COUMADIN) 1 MG tablet, Take 1 tablet (1 mg total) by mouth one time only at 6 PM. (Patient taking differently: Take 2-4 mg by mouth every evening. 2mg  on all days except Mondays. Take 4mg  on Mondays and Thursday), Disp: 90 tablet, Rfl: 1 .  WELCHOL 625 MG tablet, Take 3 tablets by mouth  twice a day with a meal, Disp: 180 tablet, Rfl: 3    Review of Systems  Constitutional: Negative for fever and unexpected weight change.  HENT: Negative for congestion, dental problem, ear pain, nosebleeds, postnasal drip, rhinorrhea, sinus pressure, sneezing, sore throat and trouble swallowing.   Eyes: Negative for redness and  itching.  Respiratory: Negative for cough, chest tightness, shortness of breath and wheezing.   Cardiovascular: Negative for palpitations and leg swelling.  Gastrointestinal: Negative for nausea and vomiting.  Genitourinary: Negative for dysuria.  Musculoskeletal: Negative for joint swelling.  Skin: Negative for rash.  Neurological: Negative for headaches.  Hematological: Does not bruise/bleed easily.  Psychiatric/Behavioral: Negative for dysphoric mood. The patient is not nervous/anxious.        Objective:   Physical Exam  Constitutional: She is oriented to person, place, and time. She appears well-developed and well-nourished. No distress.  Body mass index is 16.97 kg/(m^2).   HENT:  Head: Normocephalic and atraumatic.  Right Ear: External ear normal.  Left Ear: External ear normal.  Mouth/Throat: Oropharynx is clear and moist. No oropharyngeal exudate.  Eyes: Conjunctivae and EOM are normal. Pupils are equal, round, and reactive to light. Right eye exhibits no discharge. Left eye exhibits no  discharge. No scleral icterus.  Neck: Normal range of motion. Neck supple. No JVD present. No tracheal deviation present. No thyromegaly present.  Cardiovascular: Normal rate, regular rhythm, normal heart sounds and intact distal pulses.  Exam reveals no gallop and no friction rub.   No murmur heard. Pulmonary/Chest: Effort normal and breath sounds normal. No respiratory distress. She has no wheezes. She has no rales. She exhibits no tenderness.  Abdominal: Soft. Bowel sounds are normal. She exhibits no distension and no mass. There is no tenderness. There is no rebound and no guarding.  Musculoskeletal: Normal range of motion. She exhibits no edema or tenderness.  cachexic On wheel chair Braces to knee Scoliosis +  Lymphadenopathy:    She has no cervical adenopathy.  Neurological: She is alert and oriented to person, place, and time. She has normal reflexes. No cranial nerve deficit. She exhibits normal muscle tone. Coordination normal.  Skin: Skin is warm and dry. No rash noted. She is not diaphoretic. No erythema. No pallor.  Psychiatric: She has a normal mood and affect. Her behavior is normal. Judgment and thought content normal.  Vitals reviewed.   Filed Vitals:   07/09/14 1332  BP: 136/74  Pulse: 73  Height: 5\' 5"  (1.651 m)  Weight: 102 lb (46.267 kg)  SpO2: 94%         Assessment & Plan:     ICD-9-CM ICD-10-CM   1. Pneumonia with cavity of lung 486 J18.9 CT Chest Wo Contrast   518.89 J98.4   2. Lung mass 786.6 R91.8 CT Chest Wo Contrast    She had necrotizing pneumonia in the right lower lobe infiltrate 2016 the setting of underlying emphysema from previous smoking. As typical with these infections patients can be asymptomatic. Follow-up CT scan of the chest 07/07/2004 and show significant improvement. There is residual right lower lobe 5.2 cm thick walled cavity. She and her husband are concerned that this might be lung cancer. I think the pretest probability is fairly  high that this is actually post pneumonia cavity and not lung cancer. Nevertheless only follow-up surveillance CT scan of the chest and rule out lung cancer. In the event it is lung cancer she is not interested in any biopsies. She is open to follow-up CT scan of the chest  Will do repeat CT scan of the chest without contrast in 2 months and reassess her. She appears to have emphysema on CT scan of the chest but she is hardly symptomatic and she is satisfied with pro-air when necessary for this. She's not inclined to take any other  inhaler   Dr. Brand Males, M.D., [facility].C.P Pulmonary and Critical Care Medicine Staff Physician [facility] Pager: [phone], If no answer or between  15:00h - 7:00h: call [phone]  07/09/2014 2:01 PM

## 2014-07-09 NOTE — Patient Instructions (Signed)
ICD-9-CM ICD-10-CM   1. Pneumonia with cavity of lung 486 J18.9    518.89 J98.4   2. Lung mass 786.6 R91.8    You are improved but with residual Cavity/MAss Right lung lower lobe superior segment Repeat CT chest wo contrast - [facility] Na - 2 months  Followup AFter CT chest in 2 months

## 2014-07-09 NOTE — Therapy (Signed)
[facility] [place], [place], [address] Phone: [phone]   Fax:  [phone]  Physical Therapy Treatment  Patient Details  Name: [name] MRN: [id number] Date of Birth: [birth year] Referring Provider:  Sanjuana Kava, MD  Encounter Date: 07/09/2014      PT End of Session - 07/09/14 0932    Visit Number 27   Number of Visits 30   Date for PT Re-Evaluation 07/14/14   Authorization Type medicare   Authorization Time Period G-code done on 06/17/14 (19th visit)   Authorization - Visit Number 27   Authorization - Number of Visits 30   PT Start Time 0847   PT Stop Time 0929   PT Time Calculation (min) 42 min   Activity Tolerance Patient tolerated treatment well   Behavior During Therapy [facility] for tasks assessed/performed      Past Medical History  Diagnosis Date  . Subclavian steal syndrome   . Carotid bruit     LICA 76-19% (duplex 5/09)  . Hypertension   . Hyperlipidemia   . Atrial fibrillation     x 3 yrs  . Anticoagulation management encounter   . Meningioma   . Osteoporosis   . Stroke     CVA 2008  . Pneumonia   . Breast cancer     S/P left mastectomy, 1989 remained in remission  . Anemia     Past Surgical History  Procedure Laterality Date  . Cardiac catheterization    . Mastectomy Left 1990  . Cataract extraction Bilateral 2013  . Colonoscopy  11/17/2010    Procedure: COLONOSCOPY;  Surgeon: [name], MD;  [facility];  Service: Endoscopy;  Laterality: N/A;  10:45 am  . Esophagogastroduodenoscopy  11/17/2010    Procedure: ESOPHAGOGASTRODUODENOSCOPY (EGD);  Surgeon: [name], MD;  [facility];  Service: Endoscopy;  Laterality: N/A;  . Lumbar epidural injection  06-2012--06-2013    pt. states she has had 5 epidurals in 06-2012----06-2013  . Eye surgery      There were no vitals filed for this visit.  Visit Diagnosis:  Leg weakness, bilateral  Difficulty  walking  Poor balance      Subjective Assessment - 07/09/14 0900    Symptoms Patient states she is continuing to feel very good; some mild pain in her L thigh but otherwise feeling good. States she tried her hurricane at home and does not feel steady with it.    Pertinent History Pt states that she was on her back for about six weeks and has lost a lot of her strength.    Currently in Pain? Yes   Pain Score 3    Pain Location Other (Comment)  left thigh    Pain Orientation Left                       [facility] Adult PT Treatment/Exercise - 07/09/14 0001    Ambulation/Gait   Ambulation/Gait Yes   Ambulation/Gait Assistance 4: Min guard   Ambulation Distance (Feet) --  69ft x6    Ambulation Surface Level   Gait Comments 3 laps with quad cane, 1 lap with Hurricane; continues to demonstrate flexed posture, continues to require cues for proper sequencing with cane. Occasional mild LOB that required CGA to correct.    Lumbar Exercises: Standing   Other Standing Lumbar Exercises Sit to stand 1x10; cues for posture and slow lower back to chair as patient did  show tendency to flop and have LOB posteriorly. Min(A) to control descent until patient able to correct technique.    Lumbar Exercises: Supine   Bridge 10 reps   Bridge Limitations one leg forward, one leg back, 1x10 each side   Straight Leg Raise 15 reps   Straight Leg Raises Limitations standard   Lumbar Exercises: Sidelying   Hip Abduction 15 reps   Hip Abduction Weights (lbs) supine   Lumbar Exercises: Prone   Other Prone Lumbar Exercises Hip extensions Prone 1x5 each side                PT Education - 07/09/14 0931    Education provided Yes   Education Details Education regarding good sequencing with cane during gait; instructed to not attempt to ambulate with cane at home by herself to reduce fall risk.    Person(s) Educated Patient   Methods Explanation   Comprehension Verbalized understanding;Returned  demonstration          PT Short Term Goals - 07/05/14 1120    PT SHORT TERM GOAL #1   Title I in HEP   Time 1   Period Weeks   Status Achieved   PT SHORT TERM GOAL #2   Title Pt to be able to stand for two minutes   Baseline 07/05/14 able to stand for four minutes   Time 4   Period Weeks   Status Achieved   PT SHORT TERM GOAL #3   Title Pt to be able to walk behind wheelchair for five minues   Baseline 07/05/2014 -able to walk for five minutes if cued to slow down to preserve energy   Time 4   Period Weeks   Status Achieved   PT SHORT TERM GOAL #4   Title Pain level to be no greater than a 7/10   Baseline 3/16- 9/10   Time 4   Period Weeks   Status On-going           PT Long Term Goals - 07/05/14 1121    PT LONG TERM GOAL #1   Title I in advance HEP   Time 8   Period Weeks   Status Achieved   PT LONG TERM GOAL #2   Title 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 Continues to require cues for good sequencing and posture during gait. Difficulty with increased unsteadiness when posture corrected during gait. Also had difficulty with slow descent of sit to stand on this date. Continues to demonstrate proximal weakness; advised to perform hip ABD in supine at home with husband's help to maintain good form due to poor form in sidelying today.    Pt will benefit from skilled therapeutic intervention in order to improve on the following deficits Decreased activity tolerance;Decreased  balance;Decreased  coordination;Decreased mobility;Pain;Difficulty walking;Decreased strength   Rehab Potential Good   PT Frequency 3x / week   PT Duration 8 weeks   PT Treatment/Interventions ADLs/Self Care Home Management;Functional mobility training;Therapeutic activities;Therapeutic exercise;Balance training;Patient/family education   PT Next Visit Plan Continue gait training with quad cane and patient's personal cane; prone exercises; activity tolerance building tasks; proximal muscle strength   PT Home Exercise Plan given   Consulted and Agree with Plan of Care Patient        Problem List Patient Active Problem List   Diagnosis Date Noted  . Thrombocytosis 05/21/2014  . Protein-calorie malnutrition, severe 05/21/2014  . Anemia   . Generalized weakness   . Necrotic pneumonia 05/20/2014  . Pneumonia 05/20/2014  . Elevated liver enzymes 12/16/2013  . Tremor 06/24/2013  . Encounter for therapeutic drug monitoring 05/18/2013  . Dizziness 01/12/2013  . Wound of ankle 10/23/2010  . Long term current use of anticoagulant 07/05/2010  . Carotid bruit 11/01/2009  . HYPERLIPIDEMIA 10/26/2009  . Essential hypertension 10/26/2009  . Atrial fibrillation 10/26/2009  . ACUTE ON CHRONIC DIASTOLIC HEART FAILURE 53/61/4431  . SYNCOPE AND COLLAPSE 10/26/2009    Deniece Ree PT, DPT [facility] [address] [place], [place], [address] Phone: [phone]   Fax:  [phone]

## 2014-07-11 ENCOUNTER — Other Ambulatory Visit: Payer: Self-pay | Admitting: Cardiovascular Disease

## 2014-07-12 ENCOUNTER — Ambulatory Visit (HOSPITAL_COMMUNITY): Payer: Medicare Other | Admitting: Physical Therapy

## 2014-07-12 DIAGNOSIS — R2689 Other abnormalities of gait and mobility: Secondary | ICD-10-CM

## 2014-07-12 DIAGNOSIS — R29898 Other symptoms and signs involving the musculoskeletal system: Secondary | ICD-10-CM

## 2014-07-12 DIAGNOSIS — R262 Difficulty in walking, not elsewhere classified: Secondary | ICD-10-CM

## 2014-07-12 DIAGNOSIS — R278 Other lack of coordination: Secondary | ICD-10-CM | POA: Diagnosis not present

## 2014-07-12 DIAGNOSIS — M6281 Muscle weakness (generalized): Secondary | ICD-10-CM | POA: Diagnosis not present

## 2014-07-12 NOTE — Therapy (Signed)
[place], [place], [address] Phone: [phone]   Fax:  [phone]  Physical Therapy Treatment  Patient Details  Name: [name] MRN: [id number] Date of Birth: 20-Jun-1940 Referring Provider:  Sanjuana Kava, MD  Encounter Date: 07/12/2014      PT End of Session - 07/12/14 0844    Visit Number 28   Number of Visits 30   Date for PT Re-Evaluation 07/14/14   Authorization Type medicare   Authorization Time Period G-code done on 06/17/14 (19th visit)   Authorization - Visit Number 28   Authorization - Number of Visits 30   PT Start Time 0758   PT Stop Time 0842   PT Time Calculation (min) 44 min   Activity Tolerance Patient tolerated treatment well   Behavior During Therapy [facility] for tasks assessed/performed      Past Medical History  Diagnosis Date  . Subclavian steal syndrome   . Carotid bruit     LICA 69-62% (duplex 9/52)  . Hypertension   . Hyperlipidemia   . Atrial fibrillation     x 3 yrs  . Anticoagulation management encounter   . Meningioma   . Osteoporosis   . Stroke     CVA 2008  . Pneumonia   . Breast cancer     S/P left mastectomy, 1989 remained in remission  . Anemia     Past Surgical History  Procedure Laterality Date  . Cardiac catheterization    . Mastectomy Left 1990  . Cataract extraction Bilateral 2013  . Colonoscopy  11/17/2010    Procedure: COLONOSCOPY;  Surgeon: [name], MD;  [facility];  Service: Endoscopy;  Laterality: N/A;  10:45 am  . Esophagogastroduodenoscopy  11/17/2010    Procedure: ESOPHAGOGASTRODUODENOSCOPY (EGD);  Surgeon: [name], MD;  [facility];  Service: Endoscopy;  Laterality: N/A;  . Lumbar epidural injection  06-2012--06-2013    pt. states she has had 5 epidurals in 06-2012----06-2013  . Eye surgery      There were no vitals filed for this visit.  Visit Diagnosis:  Leg weakness, bilateral  Difficulty  walking  Poor balance      Subjective Assessment - 07/12/14 0814    Subjective Patient states that she is doing well this morning, a little bit sore but only because she did a lot of work this weekend including ironing and unloading the dishwasher. Patient also apologized for any days she had been in a bad mood, as she is still frustrated over how her doctor handled her possible lung mass.    Pertinent History Pt states that she was on her back for about six weeks and has lost a lot of her strength.                        [facility] Adult PT Treatment/Exercise - 07/12/14 0001    Ambulation/Gait   Ambulation/Gait Yes   Ambulation/Gait Assistance 4: Min guard   Ambulation Distance (Feet) --  153ft, 67ft with Hurricane, Min guard, adequate sequencing   Ambulation Surface Level   Gait Comments Sequencing improved; however patient does continue to require cues for posture and safe transfers to chair for seated rest break when fatigued from gait   Lumbar Exercises: Standing   Forward Lunge 5 reps   Forward Lunge Limitations 2 inch box;    Other Standing Lumbar Exercises SIt to stand 1x7 each side; improved posture  in upright standing and improved control of eccentric descent today   Other Standing Lumbar Exercises Step ups on 2 inch box x3 each side with Hurricane; side-stepping around mat table 1.5 times with B HHA   Lumbar Exercises: Supine   Bridge 15 reps   Bridge Limitations one leg forward, one leg back, 1x15 each side   Straight Leg Raise 20 reps   Straight Leg Raises Limitations standard   Lumbar Exercises: Sidelying   Hip Abduction 15 reps   Hip Abduction Weights (lbs) supine for good form   Lumbar Exercises: Prone   Other Prone Lumbar Exercises Hip extensions prone 1x7 each side                 PT Education - 07/12/14 0844    Education provided Yes   Education Details Patient instructed not to attempt gait at home with cane by herself in order to reduce  risk of fall with new device   Person(s) Educated Patient   Methods Explanation   Comprehension Verbalized understanding          PT Short Term Goals - 07/05/14 1120    PT SHORT TERM GOAL #1   Title I in HEP   Time 1   Period Weeks   Status Achieved   PT SHORT TERM GOAL #2   Title Pt to be able to stand for two minutes   Baseline 07/05/14 able to stand for four minutes   Time 4   Period Weeks   Status Achieved   PT SHORT TERM GOAL #3   Title Pt to be able to walk behind wheelchair for five minues   Baseline 07/05/2014 -able to walk for five minutes if cued to slow down to preserve energy   Time 4   Period Weeks   Status Achieved   PT SHORT TERM GOAL #4   Title Pain level to be no greater than a 7/10   Baseline 3/16- 9/10   Time 4   Period Weeks   Status On-going           PT Long Term Goals - 07/05/14 1121    PT LONG TERM GOAL #1   Title I in advance HEP   Time 8   Period Weeks   Status Achieved   PT LONG TERM GOAL #2   Title 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   Time 8   Period Weeks   Status On-going   PT LONG TERM GOAL #3   Title Pt to be able to walk in the house with a cane    Time 8   Period Weeks   Status On-going   PT LONG TERM GOAL #4   Title Pain level to be no greater than a 5/10 80% of the time    Time 8   Period Weeks   Status On-going               Plan - 07/12/14 0845    Clinical Impression Statement Continued gait training with Hurricane; while patient does show improved comfort with sequencing with AD, she continues to demonstrate postural impairments during gait as well as poor safety awareness while transferring to chair for seated rest break after becoing fatigued from gait. Introduced progression of functional exercises including lunges and step ups on 2 inch box, which patient did display difficulty with.    Pt will benefit from skilled therapeutic intervention in order to improve on  the following  deficits Decreased activity tolerance;Decreased balance;Decreased coordination;Decreased mobility;Pain;Difficulty walking;Decreased strength   Rehab Potential Good   PT Frequency 3x / week   PT Duration 8 weeks   PT Treatment/Interventions ADLs/Self Care Home Management;Functional mobility training;Therapeutic activities;Therapeutic exercise;Balance training;Patient/family education   PT Next Visit Plan G-codes next session. Continue gait training with quad cane and patient's personal cane; prone exercises; activity tolerance building tasks; proximal muscle strength   PT Home Exercise Plan given   Consulted and Agree with Plan of Care Patient        Problem List Patient Active Problem List   Diagnosis Date Noted  . Pneumonia with cavity of lung 07/09/2014  . Lung mass 07/09/2014  . Thrombocytosis 05/21/2014  . Protein-calorie malnutrition, severe 05/21/2014  . Anemia   . Generalized weakness   . Necrotic pneumonia 05/20/2014  . Pneumonia 05/20/2014  . Elevated liver enzymes 12/16/2013  . Tremor 06/24/2013  . Encounter for therapeutic drug monitoring 05/18/2013  . Dizziness 01/12/2013  . Wound of ankle 10/23/2010  . Long term current use of anticoagulant 07/05/2010  . Carotid bruit 11/01/2009  . HYPERLIPIDEMIA 10/26/2009  . Essential hypertension 10/26/2009  . Atrial fibrillation 10/26/2009  . ACUTE ON CHRONIC DIASTOLIC HEART FAILURE 68/03/7516  . SYNCOPE AND COLLAPSE 10/26/2009    Deniece Ree PT, DPT [facility] [address] [place], [place], [address] Phone: [phone]   Fax:  [phone]

## 2014-07-14 ENCOUNTER — Ambulatory Visit (HOSPITAL_COMMUNITY)
Admission: RE | Admit: 2014-07-14 | Discharge: 2014-07-14 | Disposition: A | Discharge status: Home / Self Care | Payer: Medicare Other | Source: Ambulatory Visit | Attending: Internal Medicine | Admitting: Internal Medicine

## 2014-07-14 DIAGNOSIS — Z1231 Encounter for screening mammogram for malignant neoplasm of breast: Secondary | ICD-10-CM | POA: Diagnosis not present

## 2014-07-16 ENCOUNTER — Other Ambulatory Visit: Payer: Self-pay | Admitting: Cardiovascular Disease

## 2014-07-16 ENCOUNTER — Other Ambulatory Visit: Payer: Self-pay | Admitting: *Deleted

## 2014-07-16 MED ORDER — COLESEVELAM HCL 625 MG PO TABS: ORAL_TABLET | ORAL | Status: DC

## 2014-07-19 ENCOUNTER — Encounter (HOSPITAL_COMMUNITY): Payer: Medicare Other | Admitting: Physical Therapy

## 2014-07-20 ENCOUNTER — Ambulatory Visit (HOSPITAL_COMMUNITY): Payer: Medicare Other | Admitting: Physical Therapy

## 2014-07-21 ENCOUNTER — Ambulatory Visit (HOSPITAL_COMMUNITY): Payer: Medicare Other | Admitting: Physical Therapy

## 2014-07-21 DIAGNOSIS — R2689 Other abnormalities of gait and mobility: Secondary | ICD-10-CM

## 2014-07-21 DIAGNOSIS — R262 Difficulty in walking, not elsewhere classified: Secondary | ICD-10-CM | POA: Diagnosis not present

## 2014-07-21 DIAGNOSIS — M6281 Muscle weakness (generalized): Secondary | ICD-10-CM | POA: Diagnosis not present

## 2014-07-21 DIAGNOSIS — R29898 Other symptoms and signs involving the musculoskeletal system: Secondary | ICD-10-CM

## 2014-07-21 DIAGNOSIS — R278 Other lack of coordination: Secondary | ICD-10-CM | POA: Diagnosis not present

## 2014-07-21 NOTE — Therapy (Signed)
[facility] [address] [place], [place], [address] Phone: [phone]   Fax:  [phone]  Physical Therapy Treatment (Re-assessment)  Patient Details  Name: [name] MRN: [id number] Date of Birth: [birth year] Referring Provider:  Darreld Mclean, MD  Encounter Date: 07/21/2014      PT End of Session - 07/21/14 1054    Visit Number 29   Number of Visits 30   Date for PT Re-Evaluation 08/18/14   Authorization Type medicare   Authorization Time Period G-code done 29th visit    Authorization - Visit Number 29   Authorization - Number of Visits 30   Activity Tolerance Patient tolerated treatment well   Behavior During Therapy [facility] for tasks assessed/performed      Past Medical History  Diagnosis Date  . Subclavian steal syndrome   . Carotid bruit     LICA 60-79% (duplex 9/11)  . Hypertension   . Hyperlipidemia   . Atrial fibrillation     x 3 yrs  . Anticoagulation management encounter   . Meningioma   . Osteoporosis   . Stroke     CVA 2008  . Pneumonia   . Breast cancer     S/P left mastectomy, 1989 remained in remission  . Anemia     Past Surgical History  Procedure Laterality Date  . Cardiac catheterization    . Mastectomy Left 1990  . Cataract extraction Bilateral 2013  . Colonoscopy  11/17/2010    Procedure: COLONOSCOPY;  Surgeon: Malissa Hippo, MD;  [facility];  Service: Endoscopy;  Laterality: N/A;  10:45 am  . Esophagogastroduodenoscopy  11/17/2010    Procedure: ESOPHAGOGASTRODUODENOSCOPY (EGD);  Surgeon: Malissa Hippo, MD;  [facility];  Service: Endoscopy;  Laterality: N/A;  . Lumbar epidural injection  06-2012--06-2013    pt. states she has had 5 epidurals in 06-2012----06-2013  . Eye surgery      There were no vitals filed for this visit.  Visit Diagnosis:  Leg weakness, bilateral  Difficulty walking  Poor balance      Subjective Assessment - 07/21/14 1036    Subjective Patient states she is doing well, she had been sick but is feeling much better and has been walking around with her cane and her husband at home; also states she ironed recently    Pertinent History Pt states that she was on her back for about six weeks and has lost a lot of her strength.    How long can you sit comfortably? 4/13- Patient states her max right now is about 45 minutes    How long can you stand comfortably? 4/13- able to stand for 5 minutes, no assistive device or outside support   How long can you walk comfortably? 4/13- 135ft with hurricane    Patient Stated Goals To be able to stand up to iron even if it is only two shirts , walk and drive    Currently in Pain? Yes   Pain Score 3    Pain Location Other (Comment)  back and front of thighs            [facility] PT Assessment - 07/21/14 0001    Observation/Other Assessments   Focus on Therapeutic Outcomes (FOTO)  58% limited    Strength   Right Hip Flexion 4-/5   Right Hip Extension 3+/5   Right Hip ABduction 3/5   Left Hip Flexion 4-/5   Left Hip Extension 3-/5  Left Hip ABduction 2/5   Right Knee Flexion 4/5   Right Knee Extension 4/5   Left Knee Flexion 4/5   Left Knee Extension 4/5   Right Ankle Dorsiflexion 3-/5   Left Ankle Dorsiflexion 2+/5   Ambulation/Gait   Ambulation/Gait Yes   Ambulation/Gait Assistance 4: Min guard   Ambulation Distance (Feet) --  127ft   Ambulation Surface Level                           PT Education - 07/21/14 1037    Education provided Yes   Education Details Progress with skilled PT services and progress towards goals; education on focus of PT as plan of care continues    Person(s) Educated Patient   Methods Explanation   Comprehension Verbalized understanding          PT Short Term Goals - 07/21/14 1050    PT SHORT TERM GOAL #1   Title I in HEP   Time 1   Period Weeks   Status Achieved   PT SHORT TERM GOAL #2   Title Pt to be able to  stand for two minutes   Baseline 04/13- able to stand for 5 minutes    Time 4   Period Weeks   Status Achieved   PT SHORT TERM GOAL #3   Title Pt to be able to walk behind wheelchair for five minues   Baseline 07/05/2014 -able to walk for five minutes if cued to slow down to preserve energy   Time 4   Period Weeks   Status Achieved   PT SHORT TERM GOAL #4   Title Pain level to be no greater than a 7/10   Baseline 4/13- on average 3/10   Time 4   Period Weeks   Status Achieved   PT SHORT TERM GOAL #5   Title Patient will demonstrate improved muscle strength in bilateral hip abductor and extensor groups, at least 4/5 for improved stability and functional task performance    Time 4   Period Weeks   Status New   PT SHORT TERM GOAL #6   Title Patient will be able to ambulate at least 149ft with supervision and cane, G-/F+ balance, and good balance reaction strategies, low fall risk   Time 4   Period Weeks   Status New           PT Long Term Goals - 07/21/14 1051    PT LONG TERM GOAL #1   Title I in advance HEP   Time 8   Period Weeks   Status Achieved   PT LONG TERM GOAL #2   Title 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   Baseline 4/13- able to stand for 5 minutes, states she ironed 5 shirts    Time 8   Period Weeks   Status Achieved   PT LONG TERM GOAL #3   Title Pt to be able to walk in the house with a cane    Baseline 4/13- walking in house with husband and hurricane    Time 8   Period Weeks   Status Achieved   PT LONG TERM GOAL #4   Title Pain level to be no greater than a 5/10 80% of the time    Baseline 4/13- on average 3/10   Time 8   Period Weeks   Status Achieved   PT LONG TERM GOAL #5  Title Patient will demonstrate the ability to safely perform transfers, mobility, and gait inside her home with cane on a Mod(I) basis and with no falls reported    Time 8   Period Weeks   Status New               Plan - Jul 26, 2014 1054     Clinical Impression Statement Re-assessment performed today. Patient shows tremendous progress towards goals, having met all of the goals that were set for her at time of initial evaluation. However patient does continue to dispaly reduced activity tolerance, is unable to ambulate with assisttive device without Min guard, and continues to display significant weakness in bilateral proximal muscles, many of which are crucial for dynamic stability and reduced fall risk. Patient will continue to benefit from skilled PT services in order to assist her in reaching her updated goals, to improve her independence with functional mobility within her home, and to overall assist her in reaching an optimal level of function. Patient overall very enthusiastic about particiapating in skilled PT services and states at this time she wishes to continue with therapy so that she may reach her personal goal of being much more independent and mobile with low fall risk in teh home.     Pt will benefit from skilled therapeutic intervention in order to improve on the following deficits Decreased activity tolerance;Decreased balance;Decreased coordination;Decreased mobility;Pain;Difficulty walking;Decreased strength   Rehab Potential Good   PT Frequency 3x / week   PT Duration 8 weeks   PT Treatment/Interventions ADLs/Self Care Home Management;Functional mobility training;Therapeutic activities;Therapeutic exercise;Balance training;Patient/family education   PT Next Visit Plan Continue gait training with assistive device, prone exercise; functional activity tolerance; proximal muscle strength    PT Home Exercise Plan given   Consulted and Agree with Plan of Care Patient          G-Codes - 2014/07/26 1052    Functional Assessment Tool Used FOTO    Functional Limitation Other PT primary   Other PT Primary Current Status (D6222) At least 40 percent but less than 60 percent impaired, limited or restricted   Other PT Primary Goal  Status (L7989) At least 20 percent but less than 40 percent impaired, limited or restricted      Problem List Patient Active Problem List   Diagnosis Date Noted  . Pneumonia with cavity of lung 07/09/2014  . Lung mass 07/09/2014  . Thrombocytosis 05/21/2014  . Protein-calorie malnutrition, severe 05/21/2014  . Anemia   . Generalized weakness   . Necrotic pneumonia 05/20/2014  . Pneumonia 05/20/2014  . Elevated liver enzymes 12/16/2013  . Tremor 06/24/2013  . Encounter for therapeutic drug monitoring 05/18/2013  . Dizziness 01/12/2013  . Wound of ankle 10/23/2010  . Long term current use of anticoagulant 07/05/2010  . Carotid bruit 11/01/2009  . HYPERLIPIDEMIA 10/26/2009  . Essential hypertension 10/26/2009  . Atrial fibrillation 10/26/2009  . ACUTE ON CHRONIC DIASTOLIC HEART FAILURE 21/19/4174  . SYNCOPE AND COLLAPSE 10/26/2009   Deniece Ree PT, DPT [facility] [address] [place], [place], [address] Phone: [phone]   Fax:  [phone]

## 2014-07-23 ENCOUNTER — Ambulatory Visit (HOSPITAL_COMMUNITY): Payer: Medicare Other | Admitting: Physical Therapy

## 2014-07-23 DIAGNOSIS — R262 Difficulty in walking, not elsewhere classified: Secondary | ICD-10-CM

## 2014-07-23 DIAGNOSIS — R29898 Other symptoms and signs involving the musculoskeletal system: Secondary | ICD-10-CM

## 2014-07-23 DIAGNOSIS — M6281 Muscle weakness (generalized): Secondary | ICD-10-CM | POA: Diagnosis not present

## 2014-07-23 DIAGNOSIS — R278 Other lack of coordination: Secondary | ICD-10-CM | POA: Diagnosis not present

## 2014-07-23 DIAGNOSIS — R2689 Other abnormalities of gait and mobility: Secondary | ICD-10-CM

## 2014-07-23 NOTE — Therapy (Signed)
[place], [place], [address] Phone: [phone]   Fax:  [phone]  Physical Therapy Treatment  Patient Details  Name: [name] MRN: [id number] Date of Birth: Apr 26, 1940 Referring Provider:  Sanjuana Kava, MD  Encounter Date: 07/23/2014      PT End of Session - 07/23/14 0846    Visit Number 30   Number of Visits 8   Date for PT Re-Evaluation 08/18/14   Authorization Type medicare   Authorization Time Period G-code done 29th visit    Authorization - Visit Number 80   Authorization - Number of Visits 60   PT Start Time 0800   PT Stop Time 0845   PT Time Calculation (min) 45 min      Past Medical History  Diagnosis Date  . Subclavian steal syndrome   . Carotid bruit     LICA 75-88% (duplex 3/25)  . Hypertension   . Hyperlipidemia   . Atrial fibrillation     x 3 yrs  . Anticoagulation management encounter   . Meningioma   . Osteoporosis   . Stroke     CVA 2008  . Pneumonia   . Breast cancer     S/P left mastectomy, 1989 remained in remission  . Anemia     Past Surgical History  Procedure Laterality Date  . Cardiac catheterization    . Mastectomy Left 1990  . Cataract extraction Bilateral 2013  . Colonoscopy  11/17/2010    Procedure: COLONOSCOPY;  Surgeon: [name], MD;  [facility];  Service: Endoscopy;  Laterality: N/A;  10:45 am  . Esophagogastroduodenoscopy  11/17/2010    Procedure: ESOPHAGOGASTRODUODENOSCOPY (EGD);  Surgeon: [name], MD;  [facility];  Service: Endoscopy;  Laterality: N/A;  . Lumbar epidural injection  06-2012--06-2013    pt. states she has had 5 epidurals in 06-2012----06-2013  . Eye surgery      There were no vitals filed for this visit.  Visit Diagnosis:  Leg weakness, bilateral  Difficulty walking  Poor balance      Subjective Assessment - 07/23/14 0825    Subjective Pt states she is doing her exercises at home.    Currently in Pain? No/denies   Pain Score 0-No pain               [facility] Adult PT Treatment/Exercise - 07/23/14 0001    Exercises   Exercises Knee/Hip   Lumbar Exercises: Standing   Heel Raises 10 reps   Heel Raises Limitations combined with functional squat.   Functional Squats 10 reps   Forward Lunge 10 reps   Forward Lunge Limitations one hand hold    Other Standing Lumbar Exercises side step x 2 RT    Other Standing Lumbar Exercises back at door B UE flexion x 10    Lumbar Exercises: Seated   Sit to Stand 10 reps   Lumbar Exercises: Prone   Straight Leg Raise 10 reps   Opposite Arm/Leg Raise Right arm/Left leg;Left arm/Right leg;5 reps   Knee/Hip Exercises: Standing   Lateral Step Up Both;10 reps   Forward Step Up 10 reps                  PT Short Term Goals - 07/21/14 1050    PT SHORT TERM GOAL #1   Title I in HEP   Time 1   Period Weeks   Status Achieved   PT SHORT TERM  GOAL #2   Title Pt to be able to stand for two minutes   Baseline 04/13- able to stand for 5 minutes    Time 4   Period Weeks   Status Achieved   PT SHORT TERM GOAL #3   Title Pt to be able to walk behind wheelchair for five minues   Baseline 07/05/2014 -able to walk for five minutes if cued to slow down to preserve energy   Time 4   Period Weeks   Status Achieved   PT SHORT TERM GOAL #4   Title Pain level to be no greater than a 7/10   Baseline 4/13- on average 3/10   Time 4   Period Weeks   Status Achieved   PT SHORT TERM GOAL #5   Title Patient will demonstrate improved muscle strength in bilateral hip abductor and extensor groups, at least 4/5 for improved stability and functional task performance    Time 4   Period Weeks   Status New   PT SHORT TERM GOAL #6   Title Patient will be able to ambulate at least 119ft with supervision and cane, G-/F+ balance, and good balance reaction strategies, low fall risk   Time 4   Period Weeks   Status New           PT  Long Term Goals - 07/21/14 1051    PT LONG TERM GOAL #1   Title I in advance HEP   Time 8   Period Weeks   Status Achieved   PT LONG TERM GOAL #2   Title 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   Baseline 4/13- able to stand for 5 minutes, states she ironed 5 shirts    Time 8   Period Weeks   Status ongoing   PT LONG TERM GOAL #3   Title Pt to be able to walk in the house with a cane    Baseline 4/13- walking in house with husband and hurricane    Time 8   Period Weeks   Status ongoing   PT LONG TERM GOAL #4   Title Pain level to be no greater than a 5/10 80% of the time    Baseline 4/13- on average 3/10   Time 8   Period Weeks   Status met   PT LONG TERM GOAL #5   Title Patient will demonstrate the ability to safely perform transfers, mobility, and gait inside her home with cane on a Mod(I) basis and with no falls reported    Time 8   Period Weeks   Status ongoing               Plan - 07/23/14 0847    Clinical Impression Statement Increased weight bearing exercises to promote balance and strength.  Pt tends to lose balance with center of gravity going behind base of support.  All exercises needed therapist facilitation but pt did very well with proper cuing.    PT Next Visit Plan begin wall bumps         Problem List Patient Active Problem List   Diagnosis Date Noted  . Pneumonia with cavity of lung 07/09/2014  . Lung mass 07/09/2014  . Thrombocytosis 05/21/2014  . Protein-calorie malnutrition, severe 05/21/2014  . Anemia   . Generalized weakness   . Necrotic pneumonia 05/20/2014  . Pneumonia 05/20/2014  . Elevated liver enzymes 12/16/2013  . Tremor 06/24/2013  . Encounter for therapeutic drug  monitoring 05/18/2013  . Dizziness 01/12/2013  . Wound of ankle 10/23/2010  . Long term current use of anticoagulant 07/05/2010  . Carotid bruit 11/01/2009  . HYPERLIPIDEMIA 10/26/2009  . Essential hypertension 10/26/2009  . Atrial  fibrillation 10/26/2009  . ACUTE ON CHRONIC DIASTOLIC HEART FAILURE 34/06/7094  . SYNCOPE AND COLLAPSE 10/26/2009  Rayetta Humphrey, PT CLT [phone] 07/23/2014, 8:49 AM  [facility] [address] [place], [place], [address] Phone: [phone]   Fax:  [phone]

## 2014-07-26 ENCOUNTER — Ambulatory Visit (HOSPITAL_COMMUNITY): Payer: Medicare Other | Admitting: Physical Therapy

## 2014-07-26 ENCOUNTER — Ambulatory Visit (INDEPENDENT_AMBULATORY_CARE_PROVIDER_SITE_OTHER): Payer: Medicare Other | Admitting: *Deleted

## 2014-07-26 DIAGNOSIS — R278 Other lack of coordination: Secondary | ICD-10-CM | POA: Diagnosis not present

## 2014-07-26 DIAGNOSIS — R262 Difficulty in walking, not elsewhere classified: Secondary | ICD-10-CM | POA: Diagnosis not present

## 2014-07-26 DIAGNOSIS — I480 Paroxysmal atrial fibrillation: Secondary | ICD-10-CM | POA: Diagnosis not present

## 2014-07-26 DIAGNOSIS — Z5181 Encounter for therapeutic drug level monitoring: Secondary | ICD-10-CM | POA: Diagnosis not present

## 2014-07-26 DIAGNOSIS — R29898 Other symptoms and signs involving the musculoskeletal system: Secondary | ICD-10-CM

## 2014-07-26 DIAGNOSIS — I4891 Unspecified atrial fibrillation: Secondary | ICD-10-CM

## 2014-07-26 DIAGNOSIS — R2689 Other abnormalities of gait and mobility: Secondary | ICD-10-CM

## 2014-07-26 DIAGNOSIS — M6281 Muscle weakness (generalized): Secondary | ICD-10-CM | POA: Diagnosis not present

## 2014-07-26 LAB — POCT INR: INR: 1.3
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
## 2014-07-28 ENCOUNTER — Ambulatory Visit (HOSPITAL_COMMUNITY): Payer: Medicare Other | Admitting: Physical Therapy

## 2014-07-28 DIAGNOSIS — R278 Other lack of coordination: Secondary | ICD-10-CM | POA: Diagnosis not present

## 2014-07-28 DIAGNOSIS — R262 Difficulty in walking, not elsewhere classified: Secondary | ICD-10-CM | POA: Diagnosis not present

## 2014-07-28 DIAGNOSIS — M6281 Muscle weakness (generalized): Secondary | ICD-10-CM | POA: Diagnosis not present

## 2014-07-28 DIAGNOSIS — R2689 Other abnormalities of gait and mobility: Secondary | ICD-10-CM

## 2014-07-28 DIAGNOSIS — R29898 Other symptoms and signs involving the musculoskeletal system: Secondary | ICD-10-CM
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
## 2014-07-30 ENCOUNTER — Ambulatory Visit (HOSPITAL_COMMUNITY): Payer: Medicare Other | Admitting: Physical Therapy

## 2014-07-30 DIAGNOSIS — M6281 Muscle weakness (generalized): Secondary | ICD-10-CM | POA: Diagnosis not present

## 2014-07-30 DIAGNOSIS — R262 Difficulty in walking, not elsewhere classified: Secondary | ICD-10-CM | POA: Diagnosis not present

## 2014-07-30 DIAGNOSIS — R2689 Other abnormalities of gait and mobility: Secondary | ICD-10-CM

## 2014-07-30 DIAGNOSIS — R278 Other lack of coordination: Secondary | ICD-10-CM | POA: Diagnosis not present

## 2014-07-30 DIAGNOSIS — R29898 Other symptoms and signs involving the musculoskeletal system: Secondary | ICD-10-CM
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
## 2014-08-02 ENCOUNTER — Ambulatory Visit (HOSPITAL_COMMUNITY): Payer: Medicare Other | Admitting: Physical Therapy

## 2014-08-02 DIAGNOSIS — R262 Difficulty in walking, not elsewhere classified: Secondary | ICD-10-CM | POA: Diagnosis not present

## 2014-08-02 DIAGNOSIS — R278 Other lack of coordination: Secondary | ICD-10-CM | POA: Diagnosis not present

## 2014-08-02 DIAGNOSIS — R29898 Other symptoms and signs involving the musculoskeletal system: Secondary | ICD-10-CM

## 2014-08-02 DIAGNOSIS — R2689 Other abnormalities of gait and mobility: Secondary | ICD-10-CM

## 2014-08-02 DIAGNOSIS — M6281 Muscle weakness (generalized): Secondary | ICD-10-CM | POA: Diagnosis not present
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
## 2014-08-04 ENCOUNTER — Other Ambulatory Visit: Payer: Self-pay | Admitting: Emergency Medicine

## 2014-08-04 ENCOUNTER — Ambulatory Visit (HOSPITAL_COMMUNITY): Payer: Medicare Other | Admitting: Physical Therapy

## 2014-08-04 DIAGNOSIS — R2689 Other abnormalities of gait and mobility: Secondary | ICD-10-CM

## 2014-08-04 DIAGNOSIS — I10 Essential (primary) hypertension: Secondary | ICD-10-CM | POA: Diagnosis not present

## 2014-08-04 DIAGNOSIS — Z681 Body mass index (BMI) 19 or less, adult: Secondary | ICD-10-CM | POA: Diagnosis not present

## 2014-08-04 DIAGNOSIS — S22089D Unspecified fracture of T11-T12 vertebra, subsequent encounter for fracture with routine healing: Secondary | ICD-10-CM | POA: Diagnosis not present

## 2014-08-04 DIAGNOSIS — R262 Difficulty in walking, not elsewhere classified: Secondary | ICD-10-CM

## 2014-08-04 DIAGNOSIS — R29898 Other symptoms and signs involving the musculoskeletal system: Secondary | ICD-10-CM

## 2014-08-04 DIAGNOSIS — R278 Other lack of coordination: Secondary | ICD-10-CM | POA: Diagnosis not present

## 2014-08-04 DIAGNOSIS — M6281 Muscle weakness (generalized): Secondary | ICD-10-CM | POA: Diagnosis not present

## 2014-08-04 MED ORDER — PROAIR HFA 108 (90 BASE) MCG/ACT IN AERS: 2.0000 | INHALATION_SPRAY | Freq: Four times a day (QID) | RESPIRATORY_TRACT | Status: DC | PRN
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
## 2014-08-06 ENCOUNTER — Ambulatory Visit (HOSPITAL_COMMUNITY): Payer: Medicare Other | Admitting: Physical Therapy

## 2014-08-06 DIAGNOSIS — R278 Other lack of coordination: Secondary | ICD-10-CM | POA: Diagnosis not present

## 2014-08-06 DIAGNOSIS — R262 Difficulty in walking, not elsewhere classified: Secondary | ICD-10-CM | POA: Diagnosis not present

## 2014-08-06 DIAGNOSIS — R29898 Other symptoms and signs involving the musculoskeletal system: Secondary | ICD-10-CM

## 2014-08-06 DIAGNOSIS — R2689 Other abnormalities of gait and mobility: Secondary | ICD-10-CM

## 2014-08-06 DIAGNOSIS — M6281 Muscle weakness (generalized): Secondary | ICD-10-CM | POA: Diagnosis not present
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
## 2014-08-09 ENCOUNTER — Ambulatory Visit (INDEPENDENT_AMBULATORY_CARE_PROVIDER_SITE_OTHER): Payer: Medicare Other | Admitting: *Deleted

## 2014-08-09 ENCOUNTER — Ambulatory Visit (HOSPITAL_COMMUNITY): Payer: Medicare Other | Attending: Orthopaedic Surgery | Admitting: Physical Therapy

## 2014-08-09 DIAGNOSIS — R2689 Other abnormalities of gait and mobility: Secondary | ICD-10-CM

## 2014-08-09 DIAGNOSIS — M6281 Muscle weakness (generalized): Secondary | ICD-10-CM | POA: Insufficient documentation

## 2014-08-09 DIAGNOSIS — I4891 Unspecified atrial fibrillation: Secondary | ICD-10-CM | POA: Diagnosis not present

## 2014-08-09 DIAGNOSIS — I480 Paroxysmal atrial fibrillation: Secondary | ICD-10-CM | POA: Diagnosis not present

## 2014-08-09 DIAGNOSIS — R278 Other lack of coordination: Secondary | ICD-10-CM | POA: Diagnosis not present

## 2014-08-09 DIAGNOSIS — Z5181 Encounter for therapeutic drug level monitoring: Secondary | ICD-10-CM | POA: Diagnosis not present

## 2014-08-09 DIAGNOSIS — R262 Difficulty in walking, not elsewhere classified: Secondary | ICD-10-CM | POA: Diagnosis not present

## 2014-08-09 DIAGNOSIS — R29898 Other symptoms and signs involving the musculoskeletal system: Secondary | ICD-10-CM

## 2014-08-09 LAB — POCT INR: INR: 2
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
## 2014-08-11 ENCOUNTER — Ambulatory Visit (HOSPITAL_COMMUNITY): Payer: Medicare Other | Admitting: Physical Therapy

## 2014-08-11 DIAGNOSIS — R262 Difficulty in walking, not elsewhere classified: Secondary | ICD-10-CM

## 2014-08-11 DIAGNOSIS — R29898 Other symptoms and signs involving the musculoskeletal system: Secondary | ICD-10-CM

## 2014-08-11 DIAGNOSIS — M6281 Muscle weakness (generalized): Secondary | ICD-10-CM | POA: Diagnosis not present

## 2014-08-11 DIAGNOSIS — R278 Other lack of coordination: Secondary | ICD-10-CM | POA: Diagnosis not present

## 2014-08-11 DIAGNOSIS — R2689 Other abnormalities of gait and mobility: Secondary | ICD-10-CM
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
## 2014-08-13 ENCOUNTER — Ambulatory Visit (HOSPITAL_COMMUNITY): Payer: Medicare Other | Admitting: Physical Therapy

## 2014-08-13 ENCOUNTER — Encounter: Payer: Self-pay | Admitting: Family

## 2014-08-13 DIAGNOSIS — R29898 Other symptoms and signs involving the musculoskeletal system: Secondary | ICD-10-CM

## 2014-08-13 DIAGNOSIS — M6281 Muscle weakness (generalized): Secondary | ICD-10-CM | POA: Diagnosis not present

## 2014-08-13 DIAGNOSIS — R2689 Other abnormalities of gait and mobility: Secondary | ICD-10-CM

## 2014-08-13 DIAGNOSIS — R262 Difficulty in walking, not elsewhere classified: Secondary | ICD-10-CM | POA: Diagnosis not present

## 2014-08-13 DIAGNOSIS — R278 Other lack of coordination: Secondary | ICD-10-CM | POA: Diagnosis not present
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
## 2014-08-16 ENCOUNTER — Encounter: Payer: Self-pay | Admitting: Family

## 2014-08-16 ENCOUNTER — Ambulatory Visit (HOSPITAL_COMMUNITY)
Admission: RE | Admit: 2014-08-16 | Discharge: 2014-08-16 | Disposition: A | Discharge status: Home / Self Care | Payer: Medicare Other | Source: Ambulatory Visit | Attending: Family | Admitting: Family

## 2014-08-16 ENCOUNTER — Ambulatory Visit (INDEPENDENT_AMBULATORY_CARE_PROVIDER_SITE_OTHER): Payer: Medicare Other | Admitting: Family

## 2014-08-16 VITALS — BP 109/74 | HR 63 | Resp 14 | Ht 62.0 in | Wt 102.0 lb

## 2014-08-16 DIAGNOSIS — I6523 Occlusion and stenosis of bilateral carotid arteries: Secondary | ICD-10-CM | POA: Insufficient documentation

## 2014-08-16 DIAGNOSIS — Z87891 Personal history of nicotine dependence: Secondary | ICD-10-CM | POA: Diagnosis not present

## 2014-08-16 NOTE — Progress Notes (Signed)
Established Carotid Patient   History of Present Illness  [name] is a 74 y.o. female patient of Dr. Trula Slade with no history of carotid intervention comes in for carotid duplex surveillance, has known right innominate artery occlusion. She denies tingling, numbness, pain or weakness in either hand or arm. Patient has orthostatic hypotension if stands up quickly, but denies dizziness otherwise. Patient denies history of amaurosis fugax, denies hemiplegia. She had a stroke in May, 2008 as manifested by dysphagia and aphasia which have resolved. She has a history of atrial fib followed by [name].   Reports New Medical or Surgical History: ESI's in the past for spinal stenosis and scoliosis, OA of the spine, DDD, and sciatic pain that is intermittent. She is in a wheelchair due to her back pain, was taking Flexeril and OxyContin prn, prescribed by her orthopedist, heating pad helps.  She has seen Dr. Hall Busing, neurologist, in the past. Her appetite is not good due to back pain, denies chronic weight loss. She had her liver evaluated, states she has ongoing evaluation for slightly elevated liver enzymes. Hospitalized at [facility] February 2016 with pneumonia. She sees a pulmonologist, Dr. Ernest Haber, for a lesion found in her lungs, has had a CT, will have another CT September 08, 2014. Left foot drop from back issues per pt; she wears a brace to prevent foot drop.   Pt Diabetic: No Pt smoker: former smoker, quit 2008  Pt meds include: Statin : Yes Betablocker: yes ASA: Yes Other anticoagulants/antiplatelets: coumadin for atrial fib, also taking Plavix    Past Medical History  Diagnosis Date  . Subclavian steal syndrome   . Carotid bruit     LICA 71-06% (duplex 2/69)  . Hypertension   . Hyperlipidemia   . Atrial fibrillation     x 3 yrs  . Anticoagulation management encounter   . Meningioma   . Osteoporosis   . Stroke     CVA 2008  . Pneumonia May 19, 2014  . Breast cancer      S/P left mastectomy, 1989 remained in remission  . Anemia   . Carotid artery occlusion     Social History History  Substance Use Topics  . Smoking status: Former Smoker -- 1.00 packs/day for 56 years    Types: Cigarettes    Quit date: 08/14/2006  . Smokeless tobacco: Never Used     Comment: x 4 yrs.  . Alcohol Use: 0.0 oz/week    0 Standard drinks or equivalent per week     Comment: Occassional glass of wine    Family History Family History  Problem Relation Age of Onset  . Alzheimer's disease Mother   . Dementia Mother   . Hypertension Mother   . Hyperlipidemia Mother   . Parkinsonism Father     Surgical History Past Surgical History  Procedure Laterality Date  . Cardiac catheterization    . Mastectomy Left 1990  . Cataract extraction Bilateral 2013  . Colonoscopy  11/17/2010    Procedure: COLONOSCOPY;  Surgeon: [name], MD;  [facility];  Service: Endoscopy;  Laterality: N/A;  10:45 am  . Esophagogastroduodenoscopy  11/17/2010    Procedure: ESOPHAGOGASTRODUODENOSCOPY (EGD);  Surgeon: [name], MD;  [facility];  Service: Endoscopy;  Laterality: N/A;  . Lumbar epidural injection  06-2012--06-2013    pt. states she has had 5 epidurals in 06-2012----06-2013  . Eye surgery      Allergies  Allergen Reactions  . Iodine  Rash and Other (See Comments)    BETADINE Rash/burning, blisters on skin.  . Iohexol Rash and Other (See Comments)    Blisters; PT NEEDS 13-HOUR PREP   . Tape Itching and Rash    Prefers PAPER TAPE    Current Outpatient Prescriptions  Medication Sig Dispense Refill  . alendronate (FOSAMAX) 70 MG tablet Take 70 mg by mouth every Sunday. Take in the morning with a full glass of water, on an empty stomach, and do not take anything else by mouth or lie down for the next 30 min. Patient take on Sunday    . amiodarone (PACERONE) 200 MG tablet Take 1 tablet by mouth  daily (Patient taking differently: Take 1  tablet by mouth  daily in the morning) 30 tablet 6  . amLODipine (NORVASC) 10 MG tablet Take 10 mg by mouth daily.    Marland Kitchen aspirin EC 81 MG tablet Take 81 mg by mouth every morning.     . calcium-vitamin D (OSCAL) 250-125 MG-UNIT per tablet Take 1 tablet by mouth 2 (two) times daily.      . clopidogrel (PLAVIX) 75 MG tablet Take 1 tablet by mouth  every morning 90 tablet 1  . colesevelam (WELCHOL) 625 MG tablet Take 3 tablets by mouth  twice a day with a meal 540 tablet 3  . HYDROcodone-acetaminophen (NORCO/VICODIN) 5-325 MG per tablet Take 1 tablet by mouth every 6 (six) hours as needed for moderate pain.    Marland Kitchen irbesartan (AVAPRO) 300 MG tablet Take 300 mg by mouth daily.    . Iron-FA-B Cmp-C-Biot-Probiotic (FUSION PLUS) CAPS Take 1 capsule by mouth every morning.     . magnesium oxide (MAG-OX) 400 MG tablet Take 400 mg by mouth every morning.     . meloxicam (MOBIC) 15 MG tablet Take 15 mg by mouth every morning.      . metoprolol tartrate (LOPRESSOR) 25 MG tablet Take 75 mg by mouth 2 (two) times daily.    . Multiple Vitamin (MULTIVITAMIN WITH MINERALS) TABS tablet Take 1 tablet by mouth every morning.    . Omega-3 Fatty Acids (FISH OIL) 1200 MG CAPS Take 1,200 mg by mouth 2 (two) times daily.    Marland Kitchen oxyCODONE-acetaminophen (PERCOCET) 7.5-325 MG per tablet Take 0.5 tablets by mouth at bedtime as needed. For pain    . pravastatin (PRAVACHOL) 40 MG tablet Take 1 tablet by mouth  every night at bedtime 90 tablet 1  . PROAIR HFA 108 (90 BASE) MCG/ACT inhaler Inhale 2 puffs into the lungs every 6 (six) hours as needed. 1 Inhaler 4  . vitamin C (ASCORBIC ACID) 500 MG tablet Take 500 mg by mouth every morning.     . warfarin (COUMADIN) 1 MG tablet Take 1 tablet (1 mg total) by mouth one time only at 6 PM. (Patient taking differently: Take 2-4 mg by mouth every evening. 4 mg Monday-Wednesday-Friday  And  2 mg Tuesday- Thursday- Saturday and Sunday) 90 tablet 1  . metoprolol (LOPRESSOR) 50 MG tablet Take 1  tablet (50 mg total) by mouth 2 (two) times daily. (Patient not taking: Reported on 08/16/2014) 60 tablet 0   No current facility-administered medications for this visit.    Review of Systems : See HPI for pertinent positives and negatives.  Physical Examination  Filed Vitals:   08/16/14 1003 08/16/14 1010  BP: 90/62 109/74  Pulse: 62 63  Resp:  14  Height:  '5\' 2"'$  (1.575 m)  Weight:  102 lb (46.267 kg)  SpO2:  96%   Body mass index is 18.65 kg/(m^2).  General: WDWN thin female in NAD GAIT: seated in wheelchair Eyes: PERRLA Pulmonary: Diminished air movement in all fields, no rales, rhonchi or wheezing. Loose cough.  Cardiac: regular Rhythm.  VASCULAR EXAM Carotid Bruits Left Right   Positive Positive    Aorta is not palpable. Radial pulses are 2+ palpable and equal.      LE Pulses LEFT RIGHT   FEMORAL  palpable  palpable    POPLITEAL not palpable   palpable   POSTERIOR TIBIAL  palpable   palpable    DORSALIS PEDIS  ANTERIOR TIBIAL palpable  palpable     Gastrointestinal: soft, nontender, BS WNL, no r/g, no palpable masses.  Musculoskeletal: Negative muscle atrophy/wasting. M/S 4/5 throughout, Extremities without ischemic changes.  Legs and arms have multiple bruises secondary to coumadin/Plavix/ASA use.  Neurologic: A&O X 3; Appropriate Affect, left lower leg/foot brace to prevent foot drop, Speech is normal CN 2-12 intact, Pain and light touch intact in extremities, Motor exam as listed above.          Non-Invasive Vascular Imaging CAROTID DUPLEX 08/16/2014   CEREBROVASCULAR DUPLEX EVALUATION    INDICATION: Carotid artery disease, known right innominate occlusion    PREVIOUS INTERVENTION(S):     DUPLEX EXAM: Carotid duplex    RIGHT  LEFT   Peak Systolic Velocities (cm/s) End Diastolic Velocities (cm/s) Plaque LOCATION Peak Systolic Velocities (cm/s) End Diastolic Velocities (cm/s) Plaque  27 9 - CCA PROXIMAL 114 20 -  26 8 - CCA MID 97 26 -  30 8 HT CCA DISTAL 83 25 -  43 14 - ECA 124 23 -  23 .7 HT ICA PROXIMAL 211 55 CP  22 0 - ICA MID 220 47 -  25 2 - ICA DISTAL 150 33 -    NA ICA / CCA Ratio (PSV) 2.2  Retrograde Vertebral Flow Abnormal antegrade  90 Brachial Systolic Pressure (mmHg) 628  Fed by vertebral artery M Brachial Artery Waveforms Biphasic    Plaque Morphology:  HM = Homogeneous, HT = Heterogeneous, CP = Calcific Plaque, SP = Smooth Plaque, IP = Irregular Plaque     ADDITIONAL FINDINGS: Waveforms throughout the right side are monophasic due to known innominate occlusion    IMPRESSION: 1. Evidence of 1 - 40% right internal carotid artery stenosis.. 2. Evidence of 50 - 69% left internal carotid artery stenosis. 3. Right vertebral artery flow is retrograde due to steal,    Compared to the previous exam:  No significant change      Assessment: [name] is a 74 y.o. female who has known right innominate artery occlusion. She denies tingling, numbness, pain or weakness in either hand or arm. Patient has orthostatic hypotension if stands up quickly, but denies dizziness otherwise. Patient denies history of amaurosis fugax, denies hemiplegia. She had a stroke in May, 2008 as manifested by dysphagia and aphasia which have resolved. She has a history of atrial fib followed by [name].  Today's carotid Duplex suggests 1 - 40% right internal carotid artery stenosis,  50 - 69% left internal carotid artery stenosis, and right vertebral artery flow is retrograde due to steal. Waveforms throughout the right side are monophasic due to known innominate occlusion. No significant change from 02/15/14 Duplex.   Plan: Follow-up in 6 months with Carotid Duplex.   I discussed in depth with the patient the nature  of atherosclerosis, and emphasized the importance of maximal medical management  including strict control of blood pressure, blood glucose, and lipid levels, obtaining regular exercise, and continued cessation of smoking.  The patient is aware that without maximal medical management the underlying atherosclerotic disease process will progress, limiting the benefit of any interventions. The patient was given information about stroke prevention and what symptoms should prompt the patient to seek immediate medical care. Thank you for allowing [place] to participate in this patient's care.  Clemon Chambers, RN, MSN, FNP-C [facility] Office: [facility] Physician:  Trula Slade  08/16/2014 10:37 AM

## 2014-08-16 NOTE — Patient Instructions (Signed)
Stroke Prevention Some medical conditions and behaviors are associated with an increased chance of having a stroke. You may prevent a stroke by making healthy choices and managing medical conditions. HOW CAN I REDUCE MY RISK OF HAVING A STROKE?   Stay physically active. Get at least 30 minutes of activity on most or all days.  Do not smoke. It may also be helpful to avoid exposure to secondhand smoke.  Limit alcohol use. Moderate alcohol use is considered to be:  No more than 2 drinks per day for men.  No more than 1 drink per day for nonpregnant women.  Eat healthy foods. This involves:  Eating 5 or more servings of fruits and vegetables a day.  Making dietary changes that address high blood pressure (hypertension), high cholesterol, diabetes, or obesity.  Manage your cholesterol levels.  Making food choices that are high in fiber and low in saturated fat, trans fat, and cholesterol may control cholesterol levels.  Take any prescribed medicines to control cholesterol as directed by your health care provider.  Manage your diabetes.  Controlling your carbohydrate and sugar intake is recommended to manage diabetes.  Take any prescribed medicines to control diabetes as directed by your health care provider.  Control your hypertension.  Making food choices that are low in salt (sodium), saturated fat, trans fat, and cholesterol is recommended to manage hypertension.  Take any prescribed medicines to control hypertension as directed by your health care provider.  Maintain a healthy weight.  Reducing calorie intake and making food choices that are low in sodium, saturated fat, trans fat, and cholesterol are recommended to manage weight.  Stop drug abuse.  Avoid taking birth control pills.  Talk to your health care provider about the risks of taking birth control pills if you are over 35 years old, smoke, get migraines, or have ever had a blood clot.  Get evaluated for sleep  disorders (sleep apnea).  Talk to your health care provider about getting a sleep evaluation if you snore a lot or have excessive sleepiness.  Take medicines only as directed by your health care provider.  For some people, aspirin or blood thinners (anticoagulants) are helpful in reducing the risk of forming abnormal blood clots that can lead to stroke. If you have the irregular heart rhythm of atrial fibrillation, you should be on a blood thinner unless there is a good reason you cannot take them.  Understand all your medicine instructions.  Make sure that other conditions (such as anemia or atherosclerosis) are addressed. SEEK IMMEDIATE MEDICAL CARE IF:   You have sudden weakness or numbness of the face, arm, or leg, especially on one side of the body.  Your face or eyelid droops to one side.  You have sudden confusion.  You have trouble speaking (aphasia) or understanding.  You have sudden trouble seeing in one or both eyes.  You have sudden trouble walking.  You have dizziness.  You have a loss of balance or coordination.  You have a sudden, severe headache with no known cause.  You have new chest pain or an irregular heartbeat. Any of these symptoms may represent a serious problem that is an emergency. Do not wait to see if the symptoms will go away. Get medical help at once. Call your local emergency services (911 in [place] Do not drive yourself to the hospital. Document Released: 05/03/2004 Document Revised: 08/10/2013 Document Reviewed: 09/26/2012 Exit[employer] Patient Information 2015 [employer]. This information is not intended to replace advice given   to you by your health care provider. Make sure you discuss any questions you have with your health care provider.  

## 2014-08-17 ENCOUNTER — Ambulatory Visit (HOSPITAL_COMMUNITY): Payer: Medicare Other | Admitting: Physical Therapy

## 2014-08-17 DIAGNOSIS — R262 Difficulty in walking, not elsewhere classified: Secondary | ICD-10-CM | POA: Diagnosis not present

## 2014-08-17 DIAGNOSIS — R278 Other lack of coordination: Secondary | ICD-10-CM | POA: Diagnosis not present

## 2014-08-17 DIAGNOSIS — R2689 Other abnormalities of gait and mobility: Secondary | ICD-10-CM

## 2014-08-17 DIAGNOSIS — M6281 Muscle weakness (generalized): Secondary | ICD-10-CM | POA: Diagnosis not present

## 2014-08-17 DIAGNOSIS — R29898 Other symptoms and signs involving the musculoskeletal system: Secondary | ICD-10-CM
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
## 2014-08-18 ENCOUNTER — Ambulatory Visit (HOSPITAL_COMMUNITY): Payer: Medicare Other | Admitting: Physical Therapy

## 2014-08-18 DIAGNOSIS — M6281 Muscle weakness (generalized): Secondary | ICD-10-CM | POA: Diagnosis not present

## 2014-08-18 DIAGNOSIS — R2689 Other abnormalities of gait and mobility: Secondary | ICD-10-CM

## 2014-08-18 DIAGNOSIS — R262 Difficulty in walking, not elsewhere classified: Secondary | ICD-10-CM | POA: Diagnosis not present

## 2014-08-18 DIAGNOSIS — R278 Other lack of coordination: Secondary | ICD-10-CM | POA: Diagnosis not present

## 2014-08-18 DIAGNOSIS — R29898 Other symptoms and signs involving the musculoskeletal system: Secondary | ICD-10-CM
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
## 2014-08-20 ENCOUNTER — Ambulatory Visit (HOSPITAL_COMMUNITY): Payer: Medicare Other | Admitting: Physical Therapy

## 2014-08-20 DIAGNOSIS — M6281 Muscle weakness (generalized): Secondary | ICD-10-CM | POA: Diagnosis not present

## 2014-08-20 DIAGNOSIS — R29898 Other symptoms and signs involving the musculoskeletal system: Secondary | ICD-10-CM

## 2014-08-20 DIAGNOSIS — R278 Other lack of coordination: Secondary | ICD-10-CM | POA: Diagnosis not present

## 2014-08-20 DIAGNOSIS — R2689 Other abnormalities of gait and mobility: Secondary | ICD-10-CM

## 2014-08-20 DIAGNOSIS — R262 Difficulty in walking, not elsewhere classified: Secondary | ICD-10-CM

## 2014-08-20 NOTE — Therapy (Signed)
[place], [place], [address] Phone: [phone]   Fax:  [phone]  Physical Therapy Treatment  Patient Details  Name: [name] MRN: [id number] Date of Birth: 10-22-1940 Referring Provider:  Sanjuana Kava, MD  Encounter Date: 08/20/2014      PT End of Session - 08/20/14 0849    Visit Number 42   Number of Visits 43   Date for PT Re-Evaluation 09/10/14   Authorization Type medicare   Authorization Time Period G_code done 39th visit    Authorization - Visit Number 42   Authorization - Number of Visits 19   PT Start Time 0800   PT Stop Time 0845   PT Time Calculation (min) 45 min   Activity Tolerance Patient tolerated treatment well   Behavior During Therapy [facility] for tasks assessed/performed      Past Medical History  Diagnosis Date  . Subclavian steal syndrome   . Carotid bruit     LICA 62-86% (duplex 3/81)  . Hypertension   . Hyperlipidemia   . Atrial fibrillation     x 3 yrs  . Anticoagulation management encounter   . Meningioma   . Osteoporosis   . Stroke     CVA 2008  . Pneumonia May 19, 2014  . Breast cancer     S/P left mastectomy, 1989 remained in remission  . Anemia   . Carotid artery occlusion     Past Surgical History  Procedure Laterality Date  . Cardiac catheterization    . Mastectomy Left 1990  . Cataract extraction Bilateral 2013  . Colonoscopy  11/17/2010    Procedure: COLONOSCOPY;  Surgeon: [name], MD;  [facility];  Service: Endoscopy;  Laterality: N/A;  10:45 am  . Esophagogastroduodenoscopy  11/17/2010    Procedure: ESOPHAGOGASTRODUODENOSCOPY (EGD);  Surgeon: [name], MD;  [facility];  Service: Endoscopy;  Laterality: N/A;  . Lumbar epidural injection  06-2012--06-2013    pt. states she has had 5 epidurals in 06-2012----06-2013  . Eye surgery      There were no vitals filed for this visit.  Visit Diagnosis:  Poor  balance  Difficulty walking  Leg weakness, bilateral      Subjective Assessment - 08/20/14 0802    Subjective Patient arrives to therapy walking in without her husband for assist and with cane. No pain this morning.    Pertinent History Pt states that she was on her back for about six weeks and has lost a lot of her strength.    Currently in Pain? No/denies                         [facility] Adult PT Treatment/Exercise - 08/20/14 0001    Ambulation/Gait   Ambulation/Gait Yes   Ambulation/Gait Assistance 4: Min guard   Ambulation Distance (Feet) --  2x170f   Assistive device Straight cane   Gait Comments head turns during gait to challenge balance   Lumbar Exercises: Standing   Heel Raises 15 reps   Heel Raises Limitations U HHA    Functional Squats --   Functional Squats Limitations --   Forward Lunge 20 reps   Forward Lunge Limitations 2 sets, 10 reps to floor, no HHA    Other Standing Lumbar Exercises march x 15 1 fingertip   Knee/Hip Exercises: Standing   Rocker Board Limitations x20 AP, x20 lateral, no HHA  Balance Exercises - 08/20/14 0807    Balance Exercises: Standing   Tandem Stance Eyes open;4 reps;15 secs   SLS Eyes open;Solid surface;4 reps;10 secs           PT Education - 08/20/14 0848    Education provided Yes   Education Details education on cert going out to mid-July; advised patient to call insurance to make sure they will continue to cover sessions    Person(s) Educated Patient   Methods Explanation   Comprehension Verbalized understanding          PT Short Term Goals - 07/21/14 1050    PT SHORT TERM GOAL #1   Title I in HEP   Time 1   Period Weeks   Status Achieved   PT SHORT TERM GOAL #2   Title Pt to be able to stand for two minutes   Baseline 04/13- able to stand for 5 minutes    Time 4   Period Weeks   Status Achieved   PT SHORT TERM GOAL #3   Title Pt to be able to walk behind wheelchair for five  minues   Baseline 07/05/2014 -able to walk for five minutes if cued to slow down to preserve energy   Time 4   Period Weeks   Status Achieved   PT SHORT TERM GOAL #4   Title Pain level to be no greater than a 7/10   Baseline 4/13- on average 3/10   Time 4   Period Weeks   Status Achieved   PT SHORT TERM GOAL #5   Title Patient will demonstrate improved muscle strength in bilateral hip abductor and extensor groups, at least 4/5 for improved stability and functional task performance    Time 4   Period Weeks   Status New   PT SHORT TERM GOAL #6   Title Patient will be able to ambulate at least 13f with supervision and cane, G-/F+ balance, and good balance reaction strategies, low fall risk   Time 4   Period Weeks   Status New           PT Long Term Goals - 07/26/14 0847    PT LONG TERM GOAL #1   Title I in advance HEP   Time 8   Period Weeks   Status Achieved   PT LONG TERM GOAL #2   Title 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   Time 8   Period Weeks   Status Achieved   PT LONG TERM GOAL #3   Title Pt to be able to walk in the house with a cane    Baseline 4/13- walking in house with husband and hurricane    Time 8   Status Achieved   PT LONG TERM GOAL #4   Title Pain level to be no greater than a 5/10 80% of the time    Baseline 4/13- on average 3/10   Time 8   Period Weeks   Status Achieved   PT LONG TERM GOAL #5   Title Patient will demonstrate the ability to safely perform transfers, mobility, and gait inside her home with cane on a Mod(I) basis and with no falls reported    Time 8   Period Weeks   Status On-going               Plan - 08/20/14 0849    Clinical Impression Statement Continued functional exercise and progressed balance activities. Introduced gait  training with head turns. Patient continues to be able to perform most exercises with no HHA with exception of standing marches. Functional activity tolerance appears to be  improving. Advised patient to call insurance to make sure further sessions will be covered.    Pt will benefit from skilled therapeutic intervention in order to improve on the following deficits Decreased activity tolerance;Decreased balance;Decreased coordination;Decreased mobility;Pain;Difficulty walking;Decreased strength   Rehab Potential Good   PT Frequency 3x / week   PT Duration 8 weeks   PT Treatment/Interventions ADLs/Self Care Home Management;Functional mobility training;Therapeutic activities;Therapeutic exercise;Balance training;Patient/family education   PT Next Visit Plan Continue functional exercise with no HHA, progress balance exercises, progress difficultyo f gait training exercises.    PT Home Exercise Plan given   Consulted and Agree with Plan of Care Patient        Problem List Patient Active Problem List   Diagnosis Date Noted  . Pneumonia with cavity of lung 07/09/2014  . Lung mass 07/09/2014  . Thrombocytosis 05/21/2014  . Protein-calorie malnutrition, severe 05/21/2014  . Anemia   . Generalized weakness   . Necrotic pneumonia 05/20/2014  . Pneumonia 05/20/2014  . Elevated liver enzymes 12/16/2013  . Tremor 06/24/2013  . Encounter for therapeutic drug monitoring 05/18/2013  . Dizziness 01/12/2013  . Wound of ankle 10/23/2010  . Long term current use of anticoagulant 07/05/2010  . Carotid bruit 11/01/2009  . HYPERLIPIDEMIA 10/26/2009  . Essential hypertension 10/26/2009  . Atrial fibrillation 10/26/2009  . ACUTE ON CHRONIC DIASTOLIC HEART FAILURE 55/73/2202  . SYNCOPE AND COLLAPSE 10/26/2009   Deniece Ree PT, DPT [facility] [address] [place], [place], [address] Phone: [phone]   Fax:  [phone]

## 2014-08-23 ENCOUNTER — Ambulatory Visit (HOSPITAL_COMMUNITY): Payer: Medicare Other | Admitting: Physical Therapy

## 2014-08-23 DIAGNOSIS — R262 Difficulty in walking, not elsewhere classified: Secondary | ICD-10-CM

## 2014-08-23 DIAGNOSIS — R29898 Other symptoms and signs involving the musculoskeletal system: Secondary | ICD-10-CM

## 2014-08-23 DIAGNOSIS — R2689 Other abnormalities of gait and mobility: Secondary | ICD-10-CM

## 2014-08-23 DIAGNOSIS — M6281 Muscle weakness (generalized): Secondary | ICD-10-CM | POA: Diagnosis not present

## 2014-08-23 DIAGNOSIS — R278 Other lack of coordination: Secondary | ICD-10-CM | POA: Diagnosis not present

## 2014-08-23 NOTE — Therapy (Signed)
[place], [place], [address] Phone: [phone]   Fax:  [phone]  Physical Therapy Treatment  Patient Details  Name: [name] MRN: [id number] Date of Birth: 23-May-1940 Referring Provider:  Sanjuana Kava, MD  Encounter Date: 08/23/2014      PT End of Session - 08/23/14 1213    Visit Number 43   Number of Visits 82   Date for PT Re-Evaluation 09/10/14   Authorization Type medicare   Authorization Time Period G_code done 39th visit    Authorization - Visit Number 43   Authorization - Number of Visits 81   PT Start Time 0810   PT Stop Time 0853   PT Time Calculation (min) 43 min   Equipment Utilized During Treatment Gait belt   Activity Tolerance Patient tolerated treatment well      Past Medical History  Diagnosis Date  . Subclavian steal syndrome   . Carotid bruit     LICA 93-81% (duplex 0/17)  . Hypertension   . Hyperlipidemia   . Atrial fibrillation     x 3 yrs  . Anticoagulation management encounter   . Meningioma   . Osteoporosis   . Stroke     CVA 2008  . Pneumonia May 19, 2014  . Breast cancer     S/P left mastectomy, 1989 remained in remission  . Anemia   . Carotid artery occlusion     Past Surgical History  Procedure Laterality Date  . Cardiac catheterization    . Mastectomy Left 1990  . Cataract extraction Bilateral 2013  . Colonoscopy  11/17/2010    Procedure: COLONOSCOPY;  Surgeon: [name], MD;  [facility];  Service: Endoscopy;  Laterality: N/A;  10:45 am  . Esophagogastroduodenoscopy  11/17/2010    Procedure: ESOPHAGOGASTRODUODENOSCOPY (EGD);  Surgeon: [name], MD;  [facility];  Service: Endoscopy;  Laterality: N/A;  . Lumbar epidural injection  06-2012--06-2013    pt. states she has had 5 epidurals in 06-2012----06-2013  . Eye surgery      There were no vitals filed for this visit.  Visit Diagnosis:  Poor  balance  Difficulty walking  Leg weakness, bilateral      Subjective Assessment - 08/23/14 0844    Subjective Pt states her Lt side of her back and down her leg has been bothering her.    Pain Score 5    Pain Location Back   Pain Orientation Right   Pain Radiating Towards Rt thigh              [facility] Adult PT Treatment/Exercise - 08/23/14 0001    Lumbar Exercises: Stretches   Passive Hamstring Stretch 3 reps;30 seconds   Single Knee to Chest Stretch 3 reps;30 seconds   Piriformis Stretch 2 reps;30 seconds   Lumbar Exercises: Standing   Forward Lunge 10 reps   Side Lunge 10 reps   Other Standing Lumbar Exercises sidestep x 2 RT; tandem and retro gt x 2 RT;    Other Standing Lumbar Exercises march x 15 1 fingertip    rocker board x 2'          PT Short Term Goals - 07/21/14 1050    PT SHORT TERM GOAL #1   Title I in HEP   Time 1   Period Weeks   Status Achieved   PT SHORT TERM GOAL #2   Title Pt to be able to stand for  two minutes   Baseline 04/13- able to stand for 5 minutes    Time 4   Period Weeks   Status Achieved   PT SHORT TERM GOAL #3   Title Pt to be able to walk behind wheelchair for five minues   Baseline 07/05/2014 -able to walk for five minutes if cued to slow down to preserve energy   Time 4   Period Weeks   Status Achieved   PT SHORT TERM GOAL #4   Title Pain level to be no greater than a 7/10   Baseline 4/13- on average 3/10   Time 4   Period Weeks   Status Achieved   PT SHORT TERM GOAL #5   Title Patient will demonstrate improved muscle strength in bilateral hip abductor and extensor groups, at least 4/5 for improved stability and functional task performance    Time 4   Period Weeks   Status New   PT SHORT TERM GOAL #6   Title Patient will be able to ambulate at least 138f with supervision and cane, G-/F+ balance, and good balance reaction strategies, low fall risk   Time 4   Period Weeks   Status New           PT Long Term  Goals - 07/26/14 0847    PT LONG TERM GOAL #1   Title I in advance HEP   Time 8   Period Weeks   Status Achieved   PT LONG TERM GOAL #2   Title 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   Time 8   Period Weeks   Status Achieved   PT LONG TERM GOAL #3   Title Pt to be able to walk in the house with a cane    Baseline 4/13- walking in house with husband and hurricane    Time 8   Status Achieved   PT LONG TERM GOAL #4   Title Pain level to be no greater than a 5/10 80% of the time    Baseline 4/13- on average 3/10   Time 8   Period Weeks   Status Achieved   PT LONG TERM GOAL #5   Title Patient will demonstrate the ability to safely perform transfers, mobility, and gait inside her home with cane on a Mod(I) basis and with no falls reported    Time 8   Period Weeks   Status On-going               Plan - 08/23/14 1214    Clinical Impression Statement Pt is no longer bringing wheelchair into department she is walking in and out of department.  Pt ambulating in a more erect position as well.  Pt is also taking fewer rest breaks.  All exercises werer completed with therapist facilitationfor safety without pt being allowed to hold on with UE.    PT Next Visit Plan begin stair climbing on 4' steps         Problem List Patient Active Problem List   Diagnosis Date Noted  . Pneumonia with cavity of lung 07/09/2014  . Lung mass 07/09/2014  . Thrombocytosis 05/21/2014  . Protein-calorie malnutrition, severe 05/21/2014  . Anemia   . Generalized weakness   . Necrotic pneumonia 05/20/2014  . Pneumonia 05/20/2014  . Elevated liver enzymes 12/16/2013  . Tremor 06/24/2013  . Encounter for therapeutic drug monitoring 05/18/2013  . Dizziness 01/12/2013  . Wound of ankle 10/23/2010  . Long  term current use of anticoagulant 07/05/2010  . Carotid bruit 11/01/2009  . HYPERLIPIDEMIA 10/26/2009  . Essential hypertension 10/26/2009  . Atrial fibrillation 10/26/2009   . ACUTE ON CHRONIC DIASTOLIC HEART FAILURE 50/38/8828  . SYNCOPE AND COLLAPSE 10/26/2009   Rayetta Humphrey, PT CLT [phone] 08/23/2014, 12:18 PM  [facility] [address] [place], [place], [address] Phone: [phone]   Fax:  [phone]

## 2014-08-25 ENCOUNTER — Ambulatory Visit (HOSPITAL_COMMUNITY): Payer: Medicare Other | Admitting: Physical Therapy

## 2014-08-25 DIAGNOSIS — R262 Difficulty in walking, not elsewhere classified: Secondary | ICD-10-CM

## 2014-08-25 DIAGNOSIS — R29898 Other symptoms and signs involving the musculoskeletal system: Secondary | ICD-10-CM

## 2014-08-25 DIAGNOSIS — M6281 Muscle weakness (generalized): Secondary | ICD-10-CM | POA: Diagnosis not present

## 2014-08-25 DIAGNOSIS — R278 Other lack of coordination: Secondary | ICD-10-CM | POA: Diagnosis not present

## 2014-08-25 DIAGNOSIS — R2689 Other abnormalities of gait and mobility: Secondary | ICD-10-CM

## 2014-08-25 NOTE — Therapy (Signed)
[name], [place], [address] Phone: [phone]   Fax:  [phone]  Physical Therapy Treatment  Patient Details  Name: [name] MRN: [id number] Date of Birth: 03-Apr-1941 Referring Provider:  Sanjuana Kava, MD  Encounter Date: 08/25/2014      PT End of Session - 08/25/14 0929    Visit Number 98   Number of Visits 86   Date for PT Re-Evaluation 09/10/14   Authorization Type medicare   Authorization Time Period G_code done 39th visit    Authorization - Visit Number 44   Authorization - Number of Visits 55   PT Start Time 0802   PT Stop Time 0848   PT Time Calculation (min) 46 min   Equipment Utilized During Treatment Gait belt   Activity Tolerance Patient tolerated treatment well   Behavior During Therapy [facility] for tasks assessed/performed      Past Medical History  Diagnosis Date  . Subclavian steal syndrome   . Carotid bruit     LICA 64-33% (duplex 2/95)  . Hypertension   . Hyperlipidemia   . Atrial fibrillation     x 3 yrs  . Anticoagulation management encounter   . Meningioma   . Osteoporosis   . Stroke     CVA 2008  . Pneumonia May 19, 2014  . Breast cancer     S/P left mastectomy, 1989 remained in remission  . Anemia   . Carotid artery occlusion     Past Surgical History  Procedure Laterality Date  . Cardiac catheterization    . Mastectomy Left 1990  . Cataract extraction Bilateral 2013  . Colonoscopy  11/17/2010    Procedure: COLONOSCOPY;  Surgeon: [name], MD;  [facility];  Service: Endoscopy;  Laterality: N/A;  10:45 am  . Esophagogastroduodenoscopy  11/17/2010    Procedure: ESOPHAGOGASTRODUODENOSCOPY (EGD);  Surgeon: [name], MD;  [facility];  Service: Endoscopy;  Laterality: N/A;  . Lumbar epidural injection  06-2012--06-2013    pt. states she has had 5 epidurals in 06-2012----06-2013  . Eye surgery      There were no vitals  filed for this visit.  Visit Diagnosis:  Poor balance  Difficulty walking  Leg weakness, bilateral      Subjective Assessment - 08/25/14 0933    Subjective PT states the nustep wears her out.  States she continues to increase her actvity at home.  Walking with SPC now.   Currently in Pain? No/denies                         [facility] Adult PT Treatment/Exercise - 08/25/14 0001    Lumbar Exercises: Standing   Heel Raises 15 reps   Heel Raises Limitations no HHA   Functional Squats 15 reps   Functional Squats Limitations no HHA   Forward Lunge 10 reps   Forward Lunge Limitations no HHA   Side Lunge 10 reps   Side Lunge Limitations no HHA   Other Standing Lumbar Exercises sidestep x 2 RT; tandem and retro gt x 2 RT;    Other Standing Lumbar Exercises staircase 4" with 1 HR step-to 1RT, reciprocally 1RT, march x 15 1 fingertip   Knee/Hip Exercises: Aerobic   Stationary Bike nustep hills #1 level 2 LE only seat 6                  PT Short Term Goals -  07/21/14 1050    PT SHORT TERM GOAL #1   Title I in HEP   Time 1   Period Weeks   Status Achieved   PT SHORT TERM GOAL #2   Title Pt to be able to stand for two minutes   Baseline 04/13- able to stand for 5 minutes    Time 4   Period Weeks   Status Achieved   PT SHORT TERM GOAL #3   Title Pt to be able to walk behind wheelchair for five minues   Baseline 07/05/2014 -able to walk for five minutes if cued to slow down to preserve energy   Time 4   Period Weeks   Status Achieved   PT SHORT TERM GOAL #4   Title Pain level to be no greater than a 7/10   Baseline 4/13- on average 3/10   Time 4   Period Weeks   Status Achieved   PT SHORT TERM GOAL #5   Title Patient will demonstrate improved muscle strength in bilateral hip abductor and extensor groups, at least 4/5 for improved stability and functional task performance    Time 4   Period Weeks   Status New   PT SHORT TERM GOAL #6   Title Patient  will be able to ambulate at least 154f with supervision and cane, G-/F+ balance, and good balance reaction strategies, low fall risk   Time 4   Period Weeks   Status New           PT Long Term Goals - 07/26/14 0847    PT LONG TERM GOAL #1   Title I in advance HEP   Time 8   Period Weeks   Status Achieved   PT LONG TERM GOAL #2   Title 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   Time 8   Period Weeks   Status Achieved   PT LONG TERM GOAL #3   Title Pt to be able to walk in the house with a cane    Baseline 4/13- walking in house with husband and hurricane    Time 8   Status Achieved   PT LONG TERM GOAL #4   Title Pain level to be no greater than a 5/10 80% of the time    Baseline 4/13- on average 3/10   Time 8   Period Weeks   Status Achieved   PT LONG TERM GOAL #5   Title Patient will demonstrate the ability to safely perform transfers, mobility, and gait inside her home with cane on a Mod(I) basis and with no falls reported    Time 8   Period Weeks   Status On-going               Plan - 08/25/14 0929    Clinical Impression Statement PT able to complete all activities today, including nustep.  REquired 5 short seated rest breaks during session today.  Most difficulty wtih tandem gait as patient hesitant to narrow her BOS.  Added stair negotiation today using SPC and 1 HR.  Pt able to complete 4" steps in reciprocal manner.  PT reports her steps are deeper at home (6-7").  Able to complete 8 minutes on nustep today but with lower parameters due to fatigue.     PT Next Visit Plan continue to progress LE strength and activity tolerance.  Increase to 7" stairs next visit in step to pattern.  Problem List Patient Active Problem List   Diagnosis Date Noted  . HYPERLIPIDEMIA 10/26/2009    Priority: Medium  . Essential hypertension 10/26/2009    Priority: Low  . Pneumonia with cavity of lung 07/09/2014  . Lung mass 07/09/2014  .  Thrombocytosis 05/21/2014  . Protein-calorie malnutrition, severe 05/21/2014  . Anemia   . Generalized weakness   . Necrotic pneumonia 05/20/2014  . Pneumonia 05/20/2014  . Elevated liver enzymes 12/16/2013  . Tremor 06/24/2013  . Encounter for therapeutic drug monitoring 05/18/2013  . Dizziness 01/12/2013  . Wound of ankle 10/23/2010  . Long term current use of anticoagulant 07/05/2010  . Carotid bruit 11/01/2009  . Atrial fibrillation 10/26/2009  . ACUTE ON CHRONIC DIASTOLIC HEART FAILURE 41/06/129  . SYNCOPE AND COLLAPSE 10/26/2009    Teena Irani, PTA/CLT [phone] 08/25/2014, 9:34 AM  [place], [place], [address] Phone: [phone]   Fax:  [phone]

## 2014-08-27 ENCOUNTER — Ambulatory Visit (HOSPITAL_COMMUNITY): Payer: Medicare Other | Admitting: Physical Therapy

## 2014-08-27 DIAGNOSIS — R2689 Other abnormalities of gait and mobility: Secondary | ICD-10-CM

## 2014-08-27 DIAGNOSIS — R29898 Other symptoms and signs involving the musculoskeletal system: Secondary | ICD-10-CM

## 2014-08-27 DIAGNOSIS — M6281 Muscle weakness (generalized): Secondary | ICD-10-CM | POA: Diagnosis not present

## 2014-08-27 DIAGNOSIS — R262 Difficulty in walking, not elsewhere classified: Secondary | ICD-10-CM | POA: Diagnosis not present

## 2014-08-27 DIAGNOSIS — R278 Other lack of coordination: Secondary | ICD-10-CM | POA: Diagnosis not present
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
## 2014-08-30 ENCOUNTER — Telehealth: Payer: Self-pay

## 2014-08-30 ENCOUNTER — Ambulatory Visit (HOSPITAL_COMMUNITY): Payer: Medicare Other | Admitting: Physical Therapy

## 2014-08-30 ENCOUNTER — Encounter (HOSPITAL_COMMUNITY): Payer: Medicare Other | Admitting: Physical Therapy

## 2014-08-30 ENCOUNTER — Ambulatory Visit (INDEPENDENT_AMBULATORY_CARE_PROVIDER_SITE_OTHER): Payer: Medicare Other | Admitting: *Deleted

## 2014-08-30 DIAGNOSIS — Z5181 Encounter for therapeutic drug level monitoring: Secondary | ICD-10-CM | POA: Diagnosis not present

## 2014-08-30 DIAGNOSIS — R262 Difficulty in walking, not elsewhere classified: Secondary | ICD-10-CM

## 2014-08-30 DIAGNOSIS — R278 Other lack of coordination: Secondary | ICD-10-CM | POA: Diagnosis not present

## 2014-08-30 DIAGNOSIS — R2689 Other abnormalities of gait and mobility: Secondary | ICD-10-CM

## 2014-08-30 DIAGNOSIS — I4891 Unspecified atrial fibrillation: Secondary | ICD-10-CM

## 2014-08-30 DIAGNOSIS — R29898 Other symptoms and signs involving the musculoskeletal system: Secondary | ICD-10-CM

## 2014-08-30 DIAGNOSIS — M6281 Muscle weakness (generalized): Secondary | ICD-10-CM | POA: Diagnosis not present

## 2014-08-30 LAB — POCT INR: INR: 1.7

## 2014-08-30 MED ORDER — WARFARIN SODIUM 4 MG PO TABS: ORAL_TABLET | ORAL | Status: DC

## 2014-08-30 MED ORDER — METOPROLOL TARTRATE 25 MG PO TABS: ORAL_TABLET | ORAL | Status: DC
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
## 2014-08-30 NOTE — Telephone Encounter (Signed)
refill 

## 2014-08-30 NOTE — Telephone Encounter (Signed)
Rx sent 

## 2014-09-01 ENCOUNTER — Ambulatory Visit (HOSPITAL_COMMUNITY): Payer: Medicare Other | Admitting: Physical Therapy

## 2014-09-01 DIAGNOSIS — R278 Other lack of coordination: Secondary | ICD-10-CM | POA: Diagnosis not present

## 2014-09-01 DIAGNOSIS — R2689 Other abnormalities of gait and mobility: Secondary | ICD-10-CM

## 2014-09-01 DIAGNOSIS — I10 Essential (primary) hypertension: Secondary | ICD-10-CM | POA: Diagnosis not present

## 2014-09-01 DIAGNOSIS — M6281 Muscle weakness (generalized): Secondary | ICD-10-CM | POA: Diagnosis not present

## 2014-09-01 DIAGNOSIS — M545 Low back pain: Secondary | ICD-10-CM | POA: Diagnosis not present

## 2014-09-01 DIAGNOSIS — R29898 Other symptoms and signs involving the musculoskeletal system: Secondary | ICD-10-CM

## 2014-09-01 DIAGNOSIS — R262 Difficulty in walking, not elsewhere classified: Secondary | ICD-10-CM | POA: Diagnosis not present

## 2014-09-01 DIAGNOSIS — Z6820 Body mass index (BMI) 20.0-20.9, adult: Secondary | ICD-10-CM | POA: Diagnosis not present

## 2014-09-01 DIAGNOSIS — Z5181 Encounter for therapeutic drug level monitoring: Secondary | ICD-10-CM | POA: Diagnosis not present
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
## 2014-09-03 ENCOUNTER — Ambulatory Visit (HOSPITAL_COMMUNITY): Payer: Medicare Other | Admitting: Physical Therapy

## 2014-09-03 DIAGNOSIS — R262 Difficulty in walking, not elsewhere classified: Secondary | ICD-10-CM | POA: Diagnosis not present

## 2014-09-03 DIAGNOSIS — R2689 Other abnormalities of gait and mobility: Secondary | ICD-10-CM

## 2014-09-03 DIAGNOSIS — R29898 Other symptoms and signs involving the musculoskeletal system: Secondary | ICD-10-CM

## 2014-09-03 DIAGNOSIS — M6281 Muscle weakness (generalized): Secondary | ICD-10-CM | POA: Diagnosis not present

## 2014-09-03 DIAGNOSIS — R278 Other lack of coordination: Secondary | ICD-10-CM | POA: Diagnosis not present

## 2014-09-03 NOTE — Therapy (Signed)
[place], [place], [address] Phone: [phone]   Fax:  [phone]  Physical Therapy Treatment  Patient Details  Name: [name] MRN: [id number] Date of Birth: 08-30-1940 Referring Provider:  Sanjuana Kava, MD  Encounter Date: 09/03/2014      PT End of Session - 09/03/14 0848    Visit Number 48   Number of Visits 11   Date for PT Re-Evaluation 09/10/14   Authorization Type medicare   Authorization Time Period G_code done 39th visit    Authorization - Visit Number 1   Authorization - Number of Visits 72   PT Start Time 0801   PT Stop Time 0843   PT Time Calculation (min) 42 min   Equipment Utilized During Treatment Gait belt   Activity Tolerance Patient limited by fatigue   Behavior During Therapy [facility] for tasks assessed/performed      Past Medical History  Diagnosis Date  . Subclavian steal syndrome   . Carotid bruit     LICA 52-77% (duplex 8/24)  . Hypertension   . Hyperlipidemia   . Atrial fibrillation     x 3 yrs  . Anticoagulation management encounter   . Meningioma   . Osteoporosis   . Stroke     CVA 2008  . Pneumonia May 19, 2014  . Breast cancer     S/P left mastectomy, 1989 remained in remission  . Anemia   . Carotid artery occlusion     Past Surgical History  Procedure Laterality Date  . Cardiac catheterization    . Mastectomy Left 1990  . Cataract extraction Bilateral 2013  . Colonoscopy  11/17/2010    Procedure: COLONOSCOPY;  Surgeon: [name], MD;  [facility];  Service: Endoscopy;  Laterality: N/A;  10:45 am  . Esophagogastroduodenoscopy  11/17/2010    Procedure: ESOPHAGOGASTRODUODENOSCOPY (EGD);  Surgeon: [name], MD;  [facility];  Service: Endoscopy;  Laterality: N/A;  . Lumbar epidural injection  06-2012--06-2013    pt. states she has had 5 epidurals in 06-2012----06-2013  . Eye surgery      There were no vitals filed for  this visit.  Visit Diagnosis:  Poor balance  Difficulty walking  Leg weakness, bilateral      Subjective Assessment - 09/03/14 0805    Subjective Patient states her husband is going out of town next week but will still have someone there with her, will not be on her own    Pertinent History Pt states that she was on her back for about six weeks and has lost a lot of her strength.    Currently in Pain? Yes   Pain Score 4    Pain Location Back                         [facility] Adult PT Treatment/Exercise - 09/03/14 0001    Lumbar Exercises: Standing   Heel Raises 20 reps   Heel Raises Limitations no HHA    Functional Squats 15 reps   Functional Squats Limitations no HHA    Forward Lunge 15 reps   Forward Lunge Limitations no HHA    Other Standing Lumbar Exercises Gait 176f x4 with supervision, cane    Other Standing Lumbar Exercises 4" stairs with U raiiling, 2 round trips    Knee/Hip Exercises: Standing   Other Standing Knee Exercises standing marches 1x20, fingertip touch B  Other Standing Knee Exercises Standing hip ABD 1x10 B                PT Education - 09/03/14 0848    Education provided No          PT Short Term Goals - 08/27/14 0855    PT SHORT TERM GOAL #1   Title I in HEP   Time 1   Period Weeks   Status Achieved   PT SHORT TERM GOAL #2   Title Pt to be able to stand for two minutes   Baseline 04/13- able to stand for 5 minutes    Time 4   Period Weeks   Status Achieved   PT SHORT TERM GOAL #3   Title Pt to be able to walk behind wheelchair for five minues   Baseline 07/05/2014 -able to walk for five minutes if cued to slow down to preserve energy   Time 4   Period Weeks   Status Achieved   PT SHORT TERM GOAL #4   Title Pain level to be no greater than a 7/10   Baseline 4/13- on average 3/10   Period Weeks   Status Achieved   PT SHORT TERM GOAL #5   Title Patient will demonstrate improved muscle strength in bilateral hip  abductor and extensor groups, at least 4/5 for improved stability and functional task performance    Time 4   Period Weeks   Status On-going   PT SHORT TERM GOAL #6   Title Patient will be able to ambulate at least 140f with supervision and cane, G-/F+ balance, and good balance reaction strategies, low fall risk   Time 4   Status On-going           PT Long Term Goals - 08/27/14 0856    PT LONG TERM GOAL #1   Title I in advance HEP   Time 8   Period Weeks   Status Achieved   PT LONG TERM GOAL #2   Title 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   Baseline 4/13- able to stand for 5 minutes, states she ironed 5 shirts    Time 8   Period Weeks   Status Achieved   PT LONG TERM GOAL #3   Title Pt to be able to walk in the house with a cane    Baseline 4/13- walking in house with husband and hurricane    Time 8   Period Weeks   Status Achieved   PT LONG TERM GOAL #4   Title Pain level to be no greater than a 5/10 80% of the time    Baseline 4/13- on average 3/10   Time 8   Status Achieved   PT LONG TERM GOAL #5   Title Patient will demonstrate the ability to safely perform transfers, mobility, and gait inside her home with cane on a Mod(I) basis and with no falls reported    Time 8   Period Weeks   Status On-going               Plan - 09/03/14 0850    Clinical Impression Statement Focused on functional exercises and gait training today [facility]. All gait with supervision today, continued to perform functional exercises with reduced assist/external support and progressed as patient was able to tolerate. Patient again requested to hold nustep today.    Pt will benefit from skilled therapeutic intervention in order to  improve on the following deficits Decreased activity tolerance;Decreased balance;Decreased coordination;Decreased mobility;Pain;Difficulty walking;Decreased strength   Rehab Potential Good   PT Frequency 3x / week   PT  Duration 8 weeks   PT Treatment/Interventions ADLs/Self Care Home Management;Functional mobility training;Therapeutic activities;Therapeutic exercise;Balance training;Patient/family education   PT Next Visit Plan G-code due next session. Continue to progress activity tolerance and LE strength progressing as able.    PT Home Exercise Plan given   Consulted and Agree with Plan of Care Patient        Problem List Patient Active Problem List   Diagnosis Date Noted  . Pneumonia with cavity of lung 07/09/2014  . Lung mass 07/09/2014  . Thrombocytosis 05/21/2014  . Protein-calorie malnutrition, severe 05/21/2014  . Anemia   . Generalized weakness   . Necrotic pneumonia 05/20/2014  . Pneumonia 05/20/2014  . Elevated liver enzymes 12/16/2013  . Tremor 06/24/2013  . Encounter for therapeutic drug monitoring 05/18/2013  . Dizziness 01/12/2013  . Wound of ankle 10/23/2010  . Long term current use of anticoagulant 07/05/2010  . Carotid bruit 11/01/2009  . HYPERLIPIDEMIA 10/26/2009  . Essential hypertension 10/26/2009  . Atrial fibrillation 10/26/2009  . ACUTE ON CHRONIC DIASTOLIC HEART FAILURE 88/75/7972  . SYNCOPE AND COLLAPSE 10/26/2009    Deniece Ree PT, DPT [place] [address] [place], [place], [address] Phone: [phone]   Fax:  [phone]

## 2014-09-08 ENCOUNTER — Ambulatory Visit (HOSPITAL_COMMUNITY)
Admission: RE | Admit: 2014-09-08 | Discharge: 2014-09-08 | Disposition: A | Discharge status: Home / Self Care | Payer: Medicare Other | Source: Ambulatory Visit | Attending: Internal Medicine | Admitting: Internal Medicine

## 2014-09-08 DIAGNOSIS — Z853 Personal history of malignant neoplasm of breast: Secondary | ICD-10-CM | POA: Diagnosis not present

## 2014-09-08 DIAGNOSIS — J189 Pneumonia, unspecified organism: Secondary | ICD-10-CM | POA: Diagnosis not present

## 2014-09-08 DIAGNOSIS — J9819 Other pulmonary collapse: Secondary | ICD-10-CM | POA: Diagnosis not present

## 2014-09-08 DIAGNOSIS — J9811 Atelectasis: Secondary | ICD-10-CM | POA: Diagnosis not present

## 2014-09-08 DIAGNOSIS — J181 Lobar pneumonia, unspecified organism: Secondary | ICD-10-CM | POA: Diagnosis not present

## 2014-09-08 DIAGNOSIS — R918 Other nonspecific abnormal finding of lung field: Secondary | ICD-10-CM | POA: Diagnosis not present

## 2014-09-08 DIAGNOSIS — J984 Other disorders of lung: Secondary | ICD-10-CM

## 2014-09-09 ENCOUNTER — Telehealth: Payer: Self-pay | Admitting: Internal Medicine

## 2014-09-09 NOTE — Telephone Encounter (Signed)
CT chest  - largely improved. Some nodules suggestive of acute infection in RML - ask if she is having any acute infectious symptoms   Dr. Brand Males, M.D., [facility].C.P Pulmonary and Critical Care Medicine Staff Physician [place] [facility] Pager: [phone], If no answer or between  15:00h - 7:00h: call [phone]  09/09/2014 4:31 PM

## 2014-09-09 NOTE — Telephone Encounter (Signed)
Called and spoke to pt. Informed her of the results per MR. Pt stated she is doing well and does not have any infectious symptoms.

## 2014-09-10 NOTE — Telephone Encounter (Signed)
iok great. I willl see her 09/29/14. Message closed

## 2014-09-13 ENCOUNTER — Ambulatory Visit (INDEPENDENT_AMBULATORY_CARE_PROVIDER_SITE_OTHER): Payer: Medicare Other | Admitting: *Deleted

## 2014-09-13 DIAGNOSIS — I4891 Unspecified atrial fibrillation: Secondary | ICD-10-CM

## 2014-09-13 DIAGNOSIS — Z5181 Encounter for therapeutic drug level monitoring: Secondary | ICD-10-CM

## 2014-09-13 DIAGNOSIS — M25572 Pain in left ankle and joints of left foot: Secondary | ICD-10-CM | POA: Diagnosis not present

## 2014-09-13 DIAGNOSIS — Z6820 Body mass index (BMI) 20.0-20.9, adult: Secondary | ICD-10-CM | POA: Diagnosis not present

## 2014-09-13 LAB — POCT INR: INR: 2.1

## 2014-09-14 ENCOUNTER — Ambulatory Visit (HOSPITAL_COMMUNITY): Payer: Medicare Other | Attending: Orthopaedic Surgery

## 2014-09-14 DIAGNOSIS — R2689 Other abnormalities of gait and mobility: Secondary | ICD-10-CM

## 2014-09-14 DIAGNOSIS — R262 Difficulty in walking, not elsewhere classified: Secondary | ICD-10-CM | POA: Insufficient documentation

## 2014-09-14 DIAGNOSIS — R278 Other lack of coordination: Secondary | ICD-10-CM | POA: Diagnosis not present

## 2014-09-14 DIAGNOSIS — M6281 Muscle weakness (generalized): Secondary | ICD-10-CM | POA: Diagnosis not present

## 2014-09-14 DIAGNOSIS — R29898 Other symptoms and signs involving the musculoskeletal system: Secondary | ICD-10-CM

## 2014-09-14 NOTE — Therapy (Signed)
[place], [place], [address] Phone: [phone]   Fax:  [phone]  Physical Therapy Treatment  Patient Details  Name: [name] MRN: [id number] Date of Birth: Mar 28, 1941 Referring Provider:  Sanjuana Kava, MD  Encounter Date: 09/14/2014      [name] End of Session - 09/14/14 0814    Visit Number 2   Number of Visits 18   Date for [name] Re-Evaluation 10/12/14   Authorization Type medicare   Authorization Time Period G_code done 49th visit    Authorization - Visit Number 49   Authorization - Number of Visits 50   [name] Start Time 0801   [name] Stop Time 0850   [name] Time Calculation (min) 49 min   Equipment Utilized During Treatment Gait belt   Activity Tolerance Patient limited by fatigue   Behavior During Therapy [facility] for tasks assessed/performed      Past Medical History  Diagnosis Date  . Subclavian steal syndrome   . Carotid bruit     LICA 53-29% (duplex 9/24)  . Hypertension   . Hyperlipidemia   . Atrial fibrillation     x 3 yrs  . Anticoagulation management encounter   . Meningioma   . Osteoporosis   . Stroke     CVA 2008  . Pneumonia May 19, 2014  . Breast cancer     S/P left mastectomy, 1989 remained in remission  . Anemia   . Carotid artery occlusion     Past Surgical History  Procedure Laterality Date  . Cardiac catheterization    . Mastectomy Left 1990  . Cataract extraction Bilateral 2013  . Colonoscopy  11/17/2010    Procedure: COLONOSCOPY;  Surgeon: [name], MD;  [facility];  Service: Endoscopy;  Laterality: N/A;  10:45 am  . Esophagogastroduodenoscopy  11/17/2010    Procedure: ESOPHAGOGASTRODUODENOSCOPY (EGD);  Surgeon: [name], MD;  [facility];  Service: Endoscopy;  Laterality: N/A;  . Lumbar epidural injection  06-2012--06-2013    [name]. states she has had 5 epidurals in 06-2012----06-2013  . Eye surgery      There were no vitals filed for  this visit.  Visit Diagnosis:  Poor balance  Difficulty walking  Leg weakness, bilateral      Subjective Assessment - 09/14/14 0807    Subjective (p) [name] entered dept stateing her Lt ankle has been bothering her since last week. Reports no falls in last 6 weeks.  [name] went to MD yesterday and was recommended to complete contrast baths and stated that has been helping.  Current pain scale 5/10 for back and Lt ankle.     Currently in Pain? (p) Yes   Pain Score (p) 5    Pain Location (p) Back   Pain Orientation (p) Lower   Pain Descriptors / Indicators (p) Aching   Pain Score (p) 5            [facility] [name] Assessment - 09/14/14 0001    Observation/Other Assessments   Focus on Therapeutic Outcomes (FOTO)  62% limited   was 58% limited   Strength   Right Hip Flexion 4-/5  was 4-/5   Right Hip Extension 3/5  was 3+/5   Right Hip ABduction 3+/5  was 3/5   Left Hip Flexion 4-/5  was 4-/5   Left Hip Extension 3-/5  was 3-/5   Left Hip ABduction 2+/5  was 2/5   Right Knee Flexion 4/5  was 4/5   Right Knee Extension 4/5  was 4/5   Left Knee Flexion 4/5  was 4/5   Left Knee Extension 4+/5  was 4/5   Right Ankle Dorsiflexion 3-/5  was 3-/5   Left Ankle Dorsiflexion 2+/5  was 2+/5   Ambulation/Gait   Ambulation/Gait Yes   Ambulation/Gait Assistance 5: Supervision   Ambulation Distance (Feet) 336 Feet  6 min walk   Assistive device Straight cane                     [facility] Adult [name] Treatment/Exercise - 09/14/14 0001    Exercises   Exercises Knee/Hip   Lumbar Exercises: Standing   Heel Raises 20 reps   Heel Raises Limitations no HHA    Functional Squats 15 reps   Functional Squats Limitations no HHA    Other Standing Lumbar Exercises Gait 226 feet with supervision, cane; 6 minute walk complete 336 feet   Other Standing Lumbar Exercises 10 STS with 1 HHA standings and no HHA descending                  [name] Short Term Goals - 09/14/14 8099    [name]  SHORT TERM GOAL #1   Title I in HEP   Status Achieved   [name] SHORT TERM GOAL #2   Title [name] to be able to stand for two minutes   Status Achieved   [name] SHORT TERM GOAL #3   Title [name] to be able to walk behind wheelchair for five minues   Status Achieved   [name] SHORT TERM GOAL #4   Title Pain level to be no greater than a 7/10   Status Achieved   [name] SHORT TERM GOAL #5   Title Patient will demonstrate improved muscle strength in bilateral hip abductor and extensor groups, at least 4/5 for improved stability and functional task performance    Status On-going   [name] SHORT TERM GOAL #6   Title Patient will be able to ambulate at least 134f with supervision and cane, G-/F+ balance, and good balance reaction strategies, low fall risk   Status On-going           [name] Long Term Goals - 09/14/14 0835    [name] LONG TERM GOAL #1   Title I in advance HEP   Status Achieved   [name] LONG TERM GOAL #2   Title [name] 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   Status Achieved   [name] LONG TERM GOAL #3   Title [name] to be able to walk in the house with a cane    Status Achieved   [name] LONG TERM GOAL #4   Title Pain level to be no greater than a 5/10 80% of the time    Status Achieved   [name] LONG TERM GOAL #5   Title Patient will demonstrate the ability to safely perform transfers, mobility, and gait inside her home with cane on a Mod(I) basis and with no falls reported    Baseline 09/14/2014: Reports no falls for last 6 weeks   Status Achieved   [name] LONG TERM GOAL #6   Title [name] able to stand for at least 10 minutes with LRAD in order to more easily perform functional tasks such as cooking   Baseline Able to stand for 5 minutes    Status New   [name] LONG TERM GOAL #7   Title [name] able to ambuoate at least  250 feet with LRAD in order to allow [name] to perform functional community tasks such as walking into grocery store   Status New               Plan - Sep 30, 2014 1412    Clinical Impression Statement  Reassessment complete with the following findings:  [name] reports compliance with HEP and ability to walk wtih [facility] around house when husband is at home for safety and to reduce fear of fallilng.  Overall LE strength has made minimal improvements since last reassessed but [name] reports ability to complete increased functional activities.  Reviewed goals with [name] who added new goals as appropraite.  Recommend continuing OPPT for 4 more weeks to address goals unmet.     [name] Next Visit Plan Recommend continuing OPPT for 4 more weeks to address goals unmet.  Continue to progress activity tolerance and LE strength progressing as able. Complete BERG balance test next session.         Problem List Patient Active Problem List   Diagnosis Date Noted  . Pneumonia with cavity of lung 07/09/2014  . Lung mass 07/09/2014  . Thrombocytosis 05/21/2014  . Protein-calorie malnutrition, severe 05/21/2014  . Anemia   . Generalized weakness   . Necrotic pneumonia 05/20/2014  . Pneumonia 05/20/2014  . Elevated liver enzymes 12/16/2013  . Tremor 06/24/2013  . Encounter for therapeutic drug monitoring 05/18/2013  . Dizziness 01/12/2013  . Wound of ankle 10/23/2010  . Long term current use of anticoagulant 07/05/2010  . Carotid bruit 11/01/2009  . HYPERLIPIDEMIA 10/26/2009  . Essential hypertension 10/26/2009  . Atrial fibrillation 10/26/2009  . ACUTE ON CHRONIC DIASTOLIC HEART FAILURE 56/86/1683  . SYNCOPE AND COLLAPSE 10/26/2009   Aldona Lento, PTA      G-Codes - September 30, 2014 1628    Functional Assessment Tool Used Based on improvements in overall activity tolerance, gait, posture, pain, patient's reports of improved QOL (FOTO gave 62% limitation today, which does not appear consistent with patient's level of function at home, functional performance in clinic, and with recent reported improvements in QOL)   Functional Limitation Other [name] primary   Other [name] Primary Current Status (F2902) At least 40 percent  but less than 60 percent impaired, limited or restricted   Other [name] Primary Goal Status (X1155) At least 20 percent but less than 40 percent impaired, limited or restricted      Deniece Ree [name], DPT [place], [place], [address] Phone: [phone]   Fax:  [phone]

## 2014-09-16 ENCOUNTER — Ambulatory Visit (HOSPITAL_COMMUNITY): Payer: Medicare Other | Admitting: Physical Therapy

## 2014-09-16 DIAGNOSIS — R29898 Other symptoms and signs involving the musculoskeletal system: Secondary | ICD-10-CM

## 2014-09-16 DIAGNOSIS — R262 Difficulty in walking, not elsewhere classified: Secondary | ICD-10-CM

## 2014-09-16 DIAGNOSIS — R2689 Other abnormalities of gait and mobility: Secondary | ICD-10-CM

## 2014-09-16 DIAGNOSIS — R278 Other lack of coordination: Secondary | ICD-10-CM | POA: Diagnosis not present

## 2014-09-16 DIAGNOSIS — M6281 Muscle weakness (generalized): Secondary | ICD-10-CM | POA: Diagnosis not present

## 2014-09-16 NOTE — Therapy (Signed)
[place], [place], [address] Phone: [phone]   Fax:  [phone]  Physical Therapy Treatment  Patient Details  Name: [name] MRN: [id number] Date of Birth: 1940-06-21 Referring Provider:  Sanjuana Kava, MD  Encounter Date: 09/16/2014      PT End of Session - 09/16/14 0856    Visit Number 50   Number of Visits 17   Date for PT Re-Evaluation 10/12/14   Authorization Type medicare   Authorization Time Period G_code done 49th visit    Authorization - Visit Number 22   Authorization - Number of Visits 109   PT Start Time 0801   PT Stop Time 0844   PT Time Calculation (min) 43 min   Equipment Utilized During Treatment Gait belt   Activity Tolerance Patient tolerated treatment well;Patient limited by fatigue   Behavior During Therapy [facility] for tasks assessed/performed      Past Medical History  Diagnosis Date  . Subclavian steal syndrome   . Carotid bruit     LICA 93-71% (duplex 6/96)  . Hypertension   . Hyperlipidemia   . Atrial fibrillation     x 3 yrs  . Anticoagulation management encounter   . Meningioma   . Osteoporosis   . Stroke     CVA 2008  . Pneumonia May 19, 2014  . Breast cancer     S/P left mastectomy, 1989 remained in remission  . Anemia   . Carotid artery occlusion     Past Surgical History  Procedure Laterality Date  . Cardiac catheterization    . Mastectomy Left 1990  . Cataract extraction Bilateral 2013  . Colonoscopy  11/17/2010    Procedure: COLONOSCOPY;  Surgeon: [name], MD;  [facility];  Service: Endoscopy;  Laterality: N/A;  10:45 am  . Esophagogastroduodenoscopy  11/17/2010    Procedure: ESOPHAGOGASTRODUODENOSCOPY (EGD);  Surgeon: [name], MD;  [facility];  Service: Endoscopy;  Laterality: N/A;  . Lumbar epidural injection  06-2012--06-2013    pt. states she has had 5 epidurals in 06-2012----06-2013  . Eye surgery       There were no vitals filed for this visit.  Visit Diagnosis:  Poor balance  Difficulty walking  Leg weakness, bilateral      Subjective Assessment - 09/16/14 0817    Subjective Patient presents very pleasant today, walking with supervision with hurricane    Pertinent History Pt states that she was on her back for about six weeks and has lost a lot of her strength.    Pain Score 4    Pain Location Back                         [facility] Adult PT Treatment/Exercise - 09/16/14 0001    Lumbar Exercises: Standing   Other Standing Lumbar Exercises Gait 256f with cane, S    Other Standing Lumbar Exercises 10 sit to stands off mat table; supine hip ABD 1x10   Lumbar Exercises: Supine   Bridge 15 reps   Bridge Limitations standard form    Other Supine Lumbar Exercises Lumbar rotations 1x20   Other Supine Lumbar Exercises Knees to chest 1x10; hip ABD 1x15                PT Education - 09/16/14 0853    Education provided Yes   Education Details extensive education regarding medicare insurance/high level of visits, possiblity  of starting OT  if MD gives approval of this plan    Person(s) Educated Patient   Methods Explanation   Comprehension Verbalized understanding          PT Short Term Goals - 09/14/14 3976    PT SHORT TERM GOAL #1   Title I in HEP   Status Achieved   PT [place] #2   Title Pt to be able to stand for two minutes   Status Achieved   PT SHORT TERM GOAL #3   Title Pt to be able to walk behind wheelchair for five minues   Status Achieved   PT SHORT TERM GOAL #4   Title Pain level to be no greater than a 7/10   Status Achieved   PT SHORT TERM GOAL #5   Title Patient will demonstrate improved muscle strength in bilateral hip abductor and extensor groups, at least 4/5 for improved stability and functional task performance    Status On-going   PT SHORT TERM GOAL #6   Title Patient will be able to ambulate at least 123f with  supervision and cane, G-/F+ balance, and good balance reaction strategies, low fall risk   Status On-going           PT Long Term Goals - 09/14/14 0835    PT LONG TERM GOAL #1   Title I in advance HEP   Status Achieved   PT LONG TERM GOAL #2   Title 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   Status Achieved   PT LONG TERM GOAL #3   Title Pt to be able to walk in the house with a cane    Status Achieved   PT LONG TERM GOAL #4   Title Pain level to be no greater than a 5/10 80% of the time    Status Achieved   PT LONG TERM GOAL #5   Title Patient will demonstrate the ability to safely perform transfers, mobility, and gait inside her home with cane on a Mod(I) basis and with no falls reported    Baseline 09/14/2014: Reports no falls for last 6 weeks   Status Achieved   PT LONG TERM GOAL #6   Title Pt able to stand for at least 10 minutes with LRAD in order to more easily perform functional tasks such as cooking   Baseline Able to stand for 5 minutes    Status New   PT LONG TERM GOAL #7   Title Pt able to ambuoate at least 250 feet with LRAD in order to allow pt to perform functional community tasks such as walking into grocery store   Status New               Plan - 09/16/14 0856    Clinical Impression Statement Extensive education provided today regarding PT related reimbursement concerns due to high number of visits with PT recently. Patient educated that her remaining impairments are more appropriate to be addressed by OT, and recommended that patient speak to MD to see if they can obtain MD referral for OT services to address remaining impairments which chiefly appear related to ADLs. Otherwise focused  on gait and development of appropriate HEP for use at home today.  Patient placed on hold with skilled PT services at this time.    Pt will benefit from skilled therapeutic intervention in order to improve on the following deficits Decreased activity  tolerance;Decreased balance;Decreased coordination;Decreased mobility;Pain;Difficulty walking;Decreased  strength   Rehab Potential Good   PT Frequency 3x / week   PT Duration 8 weeks   PT Treatment/Interventions ADLs/Self Care Home Management;Functional mobility training;Therapeutic activities;Therapeutic exercise;Balance training;Patient/family education   PT Next Visit Plan On hold   PT Home Exercise Plan given   Consulted and Agree with Plan of Care Patient        Problem List Patient Active Problem List   Diagnosis Date Noted  . Pneumonia with cavity of lung 07/09/2014  . Lung mass 07/09/2014  . Thrombocytosis 05/21/2014  . Protein-calorie malnutrition, severe 05/21/2014  . Anemia   . Generalized weakness   . Necrotic pneumonia 05/20/2014  . Pneumonia 05/20/2014  . Elevated liver enzymes 12/16/2013  . Tremor 06/24/2013  . Encounter for therapeutic drug monitoring 05/18/2013  . Dizziness 01/12/2013  . Wound of ankle 10/23/2010  . Long term current use of anticoagulant 07/05/2010  . Carotid bruit 11/01/2009  . HYPERLIPIDEMIA 10/26/2009  . Essential hypertension 10/26/2009  . Atrial fibrillation 10/26/2009  . ACUTE ON CHRONIC DIASTOLIC HEART FAILURE 94/85/4627  . SYNCOPE AND COLLAPSE 10/26/2009    Deniece Ree PT, DPT [place] [address] [place], [place], [address] Phone: [phone]   Fax:  [phone]

## 2014-09-16 NOTE — Patient Instructions (Signed)
   BRIDGING  While lying on your back, tighten your lower abdominals, squeeze your buttocks and then raise your buttocks off the floor/bed as creating a "Bridge" with your body. Repeat 15 times.     DOUBLE KNEE TO CHEST STRETCH - DKTC  While Lying on your back,  hold your knees and gently pull them up towards your chest. Do 10 times.    Lumbar Spine Rotation (Quadratus Lumborum)  Lie on the back with the knee and hips bent and feet flat on the floor.  Place the arms to the sides and outward, palms up.  Place the left leg over the right leg. Slowly allow both legs to drop towards the left, stretching the right side. Repeat 20 times.      ELASTIC BAND - SUPINE HIP ABDUCTION  While lying on your back, slowly bring your leg out to the side. Keep  your knee straight the entire time. Repeat 10 times each leg, making sure to keep your toes facing up towards the ceiling.       SIT TO STAND - NO HANDS  Start by sitting in a chair. Next, raise up to standing without using your hands for support. Repeat 10 times.   Side-Stepping   Walk to left side with eyes open. Take even steps, leading with same foot. Make sure each foot lifts off the floor. Repeat in opposite direction. Repeat for __5__ minutes per session. Do _2___ sessions per day.  Copyright  VHI. All rights reserved.   WALKING MULTIPLE TIMES A DAY  Try to walk for at least 10-15 minute bouts with your cane and someone with you; do this 2-3 times a day.

## 2014-09-21 ENCOUNTER — Encounter (HOSPITAL_COMMUNITY): Payer: Medicare Other | Admitting: Physical Therapy

## 2014-09-23 ENCOUNTER — Encounter (HOSPITAL_COMMUNITY): Payer: Medicare Other | Admitting: Physical Therapy

## 2014-09-24 ENCOUNTER — Encounter (HOSPITAL_COMMUNITY): Payer: Medicare Other

## 2014-09-27 ENCOUNTER — Encounter (HOSPITAL_COMMUNITY): Payer: Medicare Other | Admitting: Physical Therapy

## 2014-09-29 ENCOUNTER — Ambulatory Visit (INDEPENDENT_AMBULATORY_CARE_PROVIDER_SITE_OTHER): Payer: Medicare Other | Admitting: Internal Medicine

## 2014-09-29 ENCOUNTER — Other Ambulatory Visit: Payer: Self-pay | Admitting: Cardiovascular Disease

## 2014-09-29 ENCOUNTER — Encounter (HOSPITAL_COMMUNITY): Payer: Medicare Other | Admitting: Physical Therapy

## 2014-09-29 ENCOUNTER — Encounter: Payer: Self-pay | Admitting: Internal Medicine

## 2014-09-29 VITALS — BP 128/70 | HR 62 | Ht 62.0 in | Wt 115.2 lb

## 2014-09-29 DIAGNOSIS — Z87891 Personal history of nicotine dependence: Secondary | ICD-10-CM

## 2014-09-29 DIAGNOSIS — J189 Pneumonia, unspecified organism: Secondary | ICD-10-CM

## 2014-09-29 DIAGNOSIS — I6523 Occlusion and stenosis of bilateral carotid arteries: Secondary | ICD-10-CM

## 2014-09-29 DIAGNOSIS — Z720 Tobacco use: Secondary | ICD-10-CM | POA: Diagnosis not present

## 2014-09-29 DIAGNOSIS — R918 Other nonspecific abnormal finding of lung field: Secondary | ICD-10-CM | POA: Insufficient documentation

## 2014-09-29 DIAGNOSIS — J984 Other disorders of lung: Secondary | ICD-10-CM

## 2014-09-29 DIAGNOSIS — Z122 Encounter for screening for malignant neoplasm of respiratory organs: Secondary | ICD-10-CM

## 2014-09-29 NOTE — Progress Notes (Signed)
Subjective:    Patient ID: [name], female    DOB: 12-Feb-1941, 74 y.o.   MRN: [id number]  HPI   PCP Delphina Cahill, MD REferrd by dr Johnsie Cancel HPI  IOV 07/09/2014  Chief Complaint  Patient presents with  . Pulmonary Consult    Pt referred by [name] fpr abnormal CT. Pt denies SOB, cough, and CP/tightness. Pt stated she went to hospital for pna on 05/20/14.     74 year old female with significant neuromuscular disability due to prior stroke, spinal issues. Previous greater than 50 pack smoker. Was admitted around 05/20/2014 with significant right lower lobe pneumonia/huge masslike appearance on CT scan of the chest. Apparently she was extensively asymptomatic at this point in time. Treated with antibiotics. She had follow-up CT scan of the chest which shows significant improvement in the right lower lobe opacity but with a residual 5.2 cm thick-walled cavitary lesion. There for she's here for follow-up. At baseline she reports no dyspnea but then she is extremely disabled and is only able to move a little bit with pushing a wheelchair and this is because of a spinal issues. She only rarely feels short of breath and manages with pro-air when necessary. She's never had lung function test in the past. She denies any cough. Currently she feels better     OV 09/29/2014  Chief Complaint  Patient presents with  . Follow-up    Pt here after CT scan. Pt states her breathing is unchanged since last OV. Pt has left ankle edema dt recent sprain, pt is seeing PCP for the issue.     Follow-up   - Right lower lobe lung cavity following pneumonia February 2016: She had CT scan of the chest June 2016. This showed near resolution of the cavity and it is down to a scar tissue. I personally visualized image dated 09/08/2014  - New issue: CT scan of the chest 09/08/2014 compared to CT scans of the chest March 2016 show some new right middle lobe groundglass nodules that are extremely small. She denies  she was sick at that time  - Other issue: History of smoking with shortness of breath: She denies any shortness of breath or cough but on deeper questioning she does admit to occasional shortness of breath that is very rare. She uses pro-air may be once every 2 weeks. She feels good. She's never had pulmonary function testing. She has a history of 56 pack smoking history but is quit. She does not want to pulmonary function test now but will do it in the future   reports that she quit smoking about 8 years ago. Her smoking use included Cigarettes. She has a 56 pack-year smoking history. She has never used smokeless tobacco.   Immunization History  Administered Date(s) Administered  . Influenza Split 01/07/2014  . Influenza-Unspecified 01/07/2013  . Pneumococcal Conjugate-13 01/07/2014  . Tdap 08/01/2011     Review of Systems  Constitutional: Negative for fever and unexpected weight change.  HENT: Negative for congestion, dental problem, ear pain, nosebleeds, postnasal drip, rhinorrhea, sinus pressure, sneezing, sore throat and trouble swallowing.   Eyes: Negative for redness and itching.  Respiratory: Negative for cough, chest tightness, shortness of breath and wheezing.   Cardiovascular: Negative for palpitations and leg swelling.  Gastrointestinal: Negative for nausea and vomiting.  Genitourinary: Negative for dysuria.  Musculoskeletal: Negative for joint swelling.  Skin: Negative for rash.  Neurological: Negative for headaches.  Hematological: Does not bruise/bleed easily.  Psychiatric/Behavioral: Negative for  dysphoric mood. The patient is not nervous/anxious.        Objective:   Physical Exam  Constitutional: She is oriented to person, place, and time. She appears well-developed and well-nourished. No distress.  Body mass index is 21.06 kg/(m^2). Looking much better  HENT:  Head: Normocephalic and atraumatic.  Right Ear: External ear normal.  Left Ear: External ear normal.   Mouth/Throat: Oropharynx is clear and moist. No oropharyngeal exudate.  Eyes: Conjunctivae and EOM are normal. Pupils are equal, round, and reactive to light. Right eye exhibits no discharge. Left eye exhibits no discharge. No scleral icterus.  Neck: Normal range of motion. Neck supple. No JVD present. No tracheal deviation present. No thyromegaly present.  Cardiovascular: Normal rate, regular rhythm, normal heart sounds and intact distal pulses.  Exam reveals no gallop and no friction rub.   No murmur heard. Pulmonary/Chest: Effort normal and breath sounds normal. No respiratory distress. She has no wheezes. She has no rales. She exhibits no tenderness.  occ scattered crackles  Abdominal: Soft. Bowel sounds are normal. She exhibits no distension and no mass. There is no tenderness. There is no rebound and no guarding.  Musculoskeletal: Normal range of motion. She exhibits no edema or tenderness.  Not on wheelchair this time  Lymphadenopathy:    She has no cervical adenopathy.  Neurological: She is alert and oriented to person, place, and time. She has normal reflexes. No cranial nerve deficit. She exhibits normal muscle tone. Coordination normal.  Skin: Skin is warm and dry. No rash noted. She is not diaphoretic. No erythema. No pallor.  extensiuve bruising on skin +  Psychiatric: She has a normal mood and affect. Her behavior is normal. Judgment and thought content normal.  Vitals reviewed.   Filed Vitals:   09/29/14 0916  BP: 128/70  Pulse: 62  Height: '5\' 2"'$  (1.575 m)  Weight: 115 lb 3.2 oz (52.254 kg)  SpO2: 95%          Assessment & Plan:     ICD-9-CM ICD-10-CM   1. Pneumonia with cavity of lung 486 J18.9    518.89 J98.4   2. Lung nodules 793.19 R91.8   3. Smoking history V15.82 Z72.0     #Pneumonia with cavity  - resolved on CT chest June 2016 to a small scar (compared to feb 20165) - have flu shot in fall -  In Nov 2016 - have another Pneumonia vaccine called  Pneumovax (you had Prevnar Oct 2015)  #Lung nodules  - new small ground glass nodules on CT June 2016 compared to feb and march 2016) - do CT chest wo contrast in 9 months from June 2016 - low dose protocol - do at [facility] of smoking   - do full PFT at [facility] anytime next 9 months  #FOlllowup  - after CT chest and PFT in 9 months   Dr. Brand Males, M.D., [facility].C.P Pulmonary and Critical Care Medicine Staff Physician [facility] Pager: [phone], If no answer or between  15:00h - 7:00h: call [phone]  09/29/2014 9:42 AM

## 2014-09-29 NOTE — Patient Instructions (Addendum)
ICD-9-CM ICD-10-CM   1. Pneumonia with cavity of lung 486 J18.9    518.89 J98.4   2. Lung nodules 793.19 R91.8   3. Smoking history V15.82 Z72.0     #Pneumonia with cavity  - resolved on CT chest June 2016 to a small scar (compared to feb 20165) - have flu shot in fall -  In Nov 2016 - have another Pneumonia vaccine called Pneumovax (you had Prevnar Oct 2015)  #Lung nodules  - new small ground glass nodules on CT June 2016 compared to feb and march 2016) - do CT chest wo contrast in 9 months from June 2016 - low dose protocol - do at [facility] of smoking   - do full PFT at [facility] anytime next 9 months  #FOlllowup  - after CT chest and PFT in 9 months

## 2014-09-30 DIAGNOSIS — K5900 Constipation, unspecified: Secondary | ICD-10-CM | POA: Diagnosis not present

## 2014-09-30 DIAGNOSIS — R600 Localized edema: Secondary | ICD-10-CM | POA: Diagnosis not present

## 2014-10-01 ENCOUNTER — Ambulatory Visit (HOSPITAL_COMMUNITY): Payer: Medicare Other | Admitting: Specialist

## 2014-10-01 ENCOUNTER — Encounter (HOSPITAL_COMMUNITY): Payer: Medicare Other

## 2014-10-01 DIAGNOSIS — R262 Difficulty in walking, not elsewhere classified: Secondary | ICD-10-CM

## 2014-10-01 NOTE — Therapy (Signed)
[place], [place], [address] Phone: [phone]   Fax:  [phone]  Occupational Therapy Screen Patient Details  Name: [name] MRN: [id number] Date of Birth: 1940-08-06 Referring Provider:  Sanjuana Kava, MD  Encounter Date: 10/01/2014      OT End of Session - 10/01/14 0929    Visit Number 1   Number of Visits 1   Authorization Type medicare   OT Start Time 0850   OT Stop Time 0915   OT Time Calculation (min) 25 min   Activity Tolerance Patient tolerated treatment well   Behavior During Therapy [facility] for tasks assessed/performed      Past Medical History  Diagnosis Date  . Subclavian steal syndrome   . Carotid bruit     LICA 75-64% (duplex 3/32)  . Hypertension   . Hyperlipidemia   . Atrial fibrillation     x 3 yrs  . Anticoagulation management encounter   . Meningioma   . Osteoporosis   . Stroke     CVA 2008  . Pneumonia May 19, 2014  . Breast cancer     S/P left mastectomy, 1989 remained in remission  . Anemia   . Carotid artery occlusion     Past Surgical History  Procedure Laterality Date  . Cardiac catheterization    . Mastectomy Left 1990  . Cataract extraction Bilateral 2013  . Colonoscopy  11/17/2010    Procedure: COLONOSCOPY;  Surgeon: [name], MD;  [facility];  Service: Endoscopy;  Laterality: N/A;  10:45 am  . Esophagogastroduodenoscopy  11/17/2010    Procedure: ESOPHAGOGASTRODUODENOSCOPY (EGD);  Surgeon: [name], MD;  [facility];  Service: Endoscopy;  Laterality: N/A;  . Lumbar epidural injection  06-2012--06-2013    pt. states she has had 5 epidurals in 06-2012----06-2013  . Eye surgery      There were no vitals filed for this visit.  Visit Diagnosis:  Difficulty walking - Plan: Ot plan of care cert/re-cert      Subjective Assessment - 10/01/14 0923    Subjective  S:  I have been going to [employer] and exercising.  I  just can't stand for long periods of time while cooking or ironing.    Patient is accompained by: Family member   Pertinent History [name] has been referred to occupational therapy due to decreased ability to complete ADLs due to decreased standing balance.     Limitations fatigues easily   Patient Stated Goals Complete ADLs as desired   Currently in Pain? No/denies           [facility] OT Assessment - 10/01/14 0001    Assessment   Diagnosis decreased ADL independence   Onset Date --  January 2016   Precautions   Precautions Back   Restrictions   Weight Bearing Restrictions No   Prior Function   Level of Independence Independent with basic ADLs   Vocation Retired   Leisure exercising, going to church   ADL   ADL comments [name] is able to complete all desired daily activities. She is able to iron one shirt at a time and then rest.     Mobility   Mobility Status --  ambulates with a cane and hand held assist   Vision - History   Baseline Vision Wears glasses all the time   Activity Tolerance   Activity Tolerance Tolerates < min activity, no significant change in vital  signs   ROM / Strength   AROM / PROM / Strength AROM;Strength   AROM   Overall AROM Comments BUE AROM is WNL   Strength   Overall Strength Comments BUE strength is 5/5   Hand Function   Right Hand Grip (lbs) 50   Left Hand Grip (lbs) 50                         OT Education - 10/01/14 0927    Education provided Yes   Education Details Recommended timing her standing endurance, and then adding 30 seconds to a minute each date.  Recommended sitting to cook, or alternating sitting and standing.     Person(s) Educated Patient;Spouse   Methods Explanation   Comprehension Verbalized understanding                    Plan - 10/01/14 0929    Clinical Impression Statement A:  Patient is a 74 year old with decreased balance and independence with BADLS.  Upon patient interview,  patient is going to gym with a personal trainer 3 times per week, completing lower and upper extremity exercises.  She is applying energy conservation techniques to her daily activities, such as sitting to complete meal prep, taking frequent rest breaks, and when standing keeping wc in close proximity in the event she fatigues and needs to rest.  Patient is satisfied with her current functional independence level, no skilled OT intervention indiicated at this time.    OT Frequency 1x / week   OT Duration --  1 week   OT Treatment/Interventions Self-care/ADL training;Patient/family education   Plan No further skilled OT intervention indicated at this time.    Consulted and Agree with Plan of Care Patient;Family member/caregiver   Family Member Consulted husband        Problem List Patient Active Problem List   Diagnosis Date Noted  . Lung nodules 09/29/2014  . Smoking history 09/29/2014  . Pneumonia with cavity of lung 07/09/2014  . Lung mass 07/09/2014  . Thrombocytosis 05/21/2014  . Protein-calorie malnutrition, severe 05/21/2014  . Anemia   . Generalized weakness   . Necrotic pneumonia 05/20/2014  . Pneumonia 05/20/2014  . Elevated liver enzymes 12/16/2013  . Tremor 06/24/2013  . Encounter for therapeutic drug monitoring 05/18/2013  . Dizziness 01/12/2013  . Wound of ankle 10/23/2010  . Long term current use of anticoagulant 07/05/2010  . Carotid bruit 11/01/2009  . HYPERLIPIDEMIA 10/26/2009  . Essential hypertension 10/26/2009  . Atrial fibrillation 10/26/2009  . ACUTE ON CHRONIC DIASTOLIC HEART FAILURE 95/18/8416  . SYNCOPE AND COLLAPSE 10/26/2009    Vangie Bicker, OTR/L [phone]  10/01/2014, 9:34 AM  [place] [address] [place], [place], [address] Phone: [phone]   Fax:  [phone]

## 2014-10-01 NOTE — Addendum Note (Signed)
Addended by: Maurice March on: 10/01/2014 01:57 PM   Modules accepted: Orders

## 2014-10-01 NOTE — Addendum Note (Signed)
Addended by: Maurice March on: 10/01/2014 03:42 PM   Modules accepted: Orders

## 2014-10-04 ENCOUNTER — Ambulatory Visit (INDEPENDENT_AMBULATORY_CARE_PROVIDER_SITE_OTHER): Payer: Medicare Other | Admitting: *Deleted

## 2014-10-04 ENCOUNTER — Encounter (HOSPITAL_COMMUNITY): Payer: Medicare Other | Admitting: Physical Therapy

## 2014-10-04 DIAGNOSIS — M79662 Pain in left lower leg: Secondary | ICD-10-CM | POA: Diagnosis not present

## 2014-10-04 DIAGNOSIS — Z5181 Encounter for therapeutic drug level monitoring: Secondary | ICD-10-CM

## 2014-10-04 DIAGNOSIS — I4891 Unspecified atrial fibrillation: Secondary | ICD-10-CM | POA: Diagnosis not present

## 2014-10-04 DIAGNOSIS — M546 Pain in thoracic spine: Secondary | ICD-10-CM | POA: Diagnosis not present

## 2014-10-04 DIAGNOSIS — M545 Low back pain: Secondary | ICD-10-CM | POA: Diagnosis not present

## 2014-10-04 DIAGNOSIS — Z6820 Body mass index (BMI) 20.0-20.9, adult: Secondary | ICD-10-CM | POA: Diagnosis not present

## 2014-10-04 LAB — POCT INR: INR: 2.4

## 2014-10-06 ENCOUNTER — Encounter (HOSPITAL_COMMUNITY): Payer: Medicare Other | Admitting: Physical Therapy

## 2014-10-08 ENCOUNTER — Encounter (HOSPITAL_COMMUNITY): Payer: Medicare Other

## 2014-10-12 ENCOUNTER — Encounter (HOSPITAL_COMMUNITY): Payer: Medicare Other | Admitting: Physical Therapy

## 2014-10-13 ENCOUNTER — Encounter (HOSPITAL_COMMUNITY): Payer: Medicare Other | Admitting: Physical Therapy

## 2014-10-15 ENCOUNTER — Encounter (HOSPITAL_COMMUNITY): Payer: Medicare Other | Admitting: Physical Therapy

## 2014-10-18 ENCOUNTER — Encounter (HOSPITAL_COMMUNITY): Payer: Medicare Other | Admitting: Physical Therapy

## 2014-10-20 ENCOUNTER — Encounter (HOSPITAL_COMMUNITY): Payer: Medicare Other | Admitting: Physical Therapy

## 2014-10-21 DIAGNOSIS — I10 Essential (primary) hypertension: Secondary | ICD-10-CM | POA: Diagnosis not present

## 2014-10-26 DIAGNOSIS — R609 Edema, unspecified: Secondary | ICD-10-CM | POA: Diagnosis not present

## 2014-10-26 DIAGNOSIS — I10 Essential (primary) hypertension: Secondary | ICD-10-CM | POA: Diagnosis not present

## 2014-10-26 DIAGNOSIS — E785 Hyperlipidemia, unspecified: Secondary | ICD-10-CM | POA: Diagnosis not present

## 2014-11-01 ENCOUNTER — Ambulatory Visit (INDEPENDENT_AMBULATORY_CARE_PROVIDER_SITE_OTHER): Payer: Medicare Other | Admitting: *Deleted

## 2014-11-01 DIAGNOSIS — Z5181 Encounter for therapeutic drug level monitoring: Secondary | ICD-10-CM | POA: Diagnosis not present

## 2014-11-01 DIAGNOSIS — I4891 Unspecified atrial fibrillation: Secondary | ICD-10-CM | POA: Diagnosis not present

## 2014-11-01 LAB — POCT INR: INR: 3.1

## 2014-11-02 ENCOUNTER — Other Ambulatory Visit (HOSPITAL_COMMUNITY): Payer: Self-pay | Admitting: Internal Medicine

## 2014-11-02 DIAGNOSIS — R609 Edema, unspecified: Secondary | ICD-10-CM

## 2014-11-03 ENCOUNTER — Ambulatory Visit (HOSPITAL_COMMUNITY)
Admission: RE | Admit: 2014-11-03 | Discharge: 2014-11-03 | Disposition: A | Discharge status: Home / Self Care | Payer: Medicare Other | Source: Ambulatory Visit | Attending: Internal Medicine | Admitting: Internal Medicine

## 2014-11-03 DIAGNOSIS — M79662 Pain in left lower leg: Secondary | ICD-10-CM | POA: Insufficient documentation

## 2014-11-03 DIAGNOSIS — R600 Localized edema: Secondary | ICD-10-CM | POA: Insufficient documentation

## 2014-11-03 DIAGNOSIS — R609 Edema, unspecified: Secondary | ICD-10-CM

## 2014-11-06 ENCOUNTER — Other Ambulatory Visit: Payer: Self-pay | Admitting: Cardiovascular Disease

## 2014-11-11 ENCOUNTER — Other Ambulatory Visit: Payer: Self-pay | Admitting: Cardiovascular Disease

## 2014-11-11 MED ORDER — ALENDRONATE SODIUM 70 MG PO TABS
70.0000 mg | ORAL_TABLET | ORAL | Status: DC
Start: 2014-11-11 — End: 2015-05-10

## 2014-11-29 ENCOUNTER — Ambulatory Visit (INDEPENDENT_AMBULATORY_CARE_PROVIDER_SITE_OTHER): Payer: Medicare Other | Admitting: *Deleted

## 2014-11-29 DIAGNOSIS — I4891 Unspecified atrial fibrillation: Secondary | ICD-10-CM | POA: Diagnosis not present

## 2014-11-29 DIAGNOSIS — Z5181 Encounter for therapeutic drug level monitoring: Secondary | ICD-10-CM | POA: Diagnosis not present

## 2014-11-29 LAB — POCT INR: INR: 4.7

## 2014-11-30 ENCOUNTER — Other Ambulatory Visit (INDEPENDENT_AMBULATORY_CARE_PROVIDER_SITE_OTHER): Payer: Self-pay | Admitting: *Deleted

## 2014-11-30 ENCOUNTER — Encounter (INDEPENDENT_AMBULATORY_CARE_PROVIDER_SITE_OTHER): Payer: Self-pay | Admitting: *Deleted

## 2014-11-30 DIAGNOSIS — R748 Abnormal levels of other serum enzymes: Secondary | ICD-10-CM

## 2014-12-01 ENCOUNTER — Other Ambulatory Visit: Payer: Self-pay

## 2014-12-01 DIAGNOSIS — M79605 Pain in left leg: Secondary | ICD-10-CM

## 2014-12-01 DIAGNOSIS — G589 Mononeuropathy, unspecified: Secondary | ICD-10-CM | POA: Diagnosis not present

## 2014-12-01 DIAGNOSIS — R0989 Other specified symptoms and signs involving the circulatory and respiratory systems: Secondary | ICD-10-CM

## 2014-12-01 DIAGNOSIS — R609 Edema, unspecified: Secondary | ICD-10-CM

## 2014-12-01 DIAGNOSIS — L89511 Pressure ulcer of right ankle, stage 1: Secondary | ICD-10-CM | POA: Diagnosis not present

## 2014-12-10 DIAGNOSIS — Z23 Encounter for immunization: Secondary | ICD-10-CM | POA: Diagnosis not present

## 2014-12-10 DIAGNOSIS — L89512 Pressure ulcer of right ankle, stage 2: Secondary | ICD-10-CM | POA: Diagnosis not present

## 2014-12-16 ENCOUNTER — Ambulatory Visit (INDEPENDENT_AMBULATORY_CARE_PROVIDER_SITE_OTHER): Payer: Medicare Other | Admitting: *Deleted

## 2014-12-16 DIAGNOSIS — Z5181 Encounter for therapeutic drug level monitoring: Secondary | ICD-10-CM

## 2014-12-16 DIAGNOSIS — I4891 Unspecified atrial fibrillation: Secondary | ICD-10-CM | POA: Diagnosis not present

## 2014-12-16 LAB — POCT INR: INR: 3.4

## 2014-12-17 DIAGNOSIS — L89512 Pressure ulcer of right ankle, stage 2: Secondary | ICD-10-CM | POA: Diagnosis not present

## 2014-12-24 ENCOUNTER — Ambulatory Visit (INDEPENDENT_AMBULATORY_CARE_PROVIDER_SITE_OTHER)
Admission: RE | Admit: 2014-12-24 | Discharge: 2014-12-24 | Disposition: A | Discharge status: Home / Self Care | Payer: Medicare Other | Source: Ambulatory Visit | Attending: Vascular Surgery | Admitting: Vascular Surgery

## 2014-12-24 ENCOUNTER — Ambulatory Visit (HOSPITAL_COMMUNITY)
Admission: RE | Admit: 2014-12-24 | Discharge: 2014-12-24 | Disposition: A | Discharge status: Home / Self Care | Payer: Medicare Other | Source: Ambulatory Visit | Attending: Vascular Surgery | Admitting: Vascular Surgery

## 2014-12-24 DIAGNOSIS — R609 Edema, unspecified: Secondary | ICD-10-CM | POA: Diagnosis not present

## 2014-12-24 DIAGNOSIS — R0989 Other specified symptoms and signs involving the circulatory and respiratory systems: Secondary | ICD-10-CM | POA: Diagnosis not present

## 2014-12-24 DIAGNOSIS — M79605 Pain in left leg: Secondary | ICD-10-CM | POA: Diagnosis not present

## 2014-12-27 ENCOUNTER — Encounter: Payer: Self-pay | Admitting: Family

## 2014-12-29 ENCOUNTER — Ambulatory Visit (INDEPENDENT_AMBULATORY_CARE_PROVIDER_SITE_OTHER): Payer: Medicare Other | Admitting: Surgery

## 2014-12-29 ENCOUNTER — Encounter: Payer: Self-pay | Admitting: Surgery

## 2014-12-29 ENCOUNTER — Ambulatory Visit: Payer: Medicare Other | Admitting: Surgery

## 2014-12-29 VITALS — BP 136/82 | HR 74 | Temp 97.0°F | Resp 14 | Ht 65.0 in | Wt 118.0 lb

## 2014-12-29 DIAGNOSIS — M7989 Other specified soft tissue disorders: Secondary | ICD-10-CM

## 2014-12-29 DIAGNOSIS — I6523 Occlusion and stenosis of bilateral carotid arteries: Secondary | ICD-10-CM | POA: Diagnosis not present

## 2014-12-29 NOTE — Progress Notes (Signed)
Patient name: [name] MRN: [id number] DOB: 1940-10-11 Sex: female     Chief Complaint  Patient presents with  . New Evaluation    bilateral leg swelling   left worse than right    HISTORY OF PRESENT ILLNESS: This is a patient that I have seen in the past and him following for innominate artery occlusion.  Today she is here for a new problem.  She has a ulcer on her right lateral malleolus and she is also complaining of new onset swelling in the left leg.  Studies have been negative for DVT on the left.  The patient states that she has had numerous ulcers on her legs in the past they have all taken up proximally one year to heal.  She refuses to go to a wound center.  She is currently leaving her right ankle wound open for 6 hours per day and applying some kind of ointment to it for the rest of the time.  The patient has a history of breast cancer and is status post left mastectomy.  She has stroke in 2008.  She is on Coumadin for atrial fibrillation.  She is medically managed for hypercholesterolemia with a statin.  She is on multiple medications for hypertension.  Past Medical History  Diagnosis Date  . Subclavian steal syndrome   . Carotid bruit     LICA 94-49% (duplex 6/75)  . Hypertension   . Hyperlipidemia   . Atrial fibrillation     x 3 yrs  . Anticoagulation management encounter   . Meningioma   . Osteoporosis   . Stroke     CVA 2008  . Pneumonia May 19, 2014  . Breast cancer     S/P left mastectomy, 1989 remained in remission  . Anemia   . Carotid artery occlusion     Past Surgical History  Procedure Laterality Date  . Cardiac catheterization    . Mastectomy Left 1990  . Cataract extraction Bilateral 2013  . Colonoscopy  11/17/2010    Procedure: COLONOSCOPY;  Surgeon: [name], MD;  [facility];  Service: Endoscopy;  Laterality: N/A;  10:45 am  . Esophagogastroduodenoscopy  11/17/2010    Procedure: ESOPHAGOGASTRODUODENOSCOPY (EGD);   Surgeon: [name], MD;  [facility];  Service: Endoscopy;  Laterality: N/A;  . Lumbar epidural injection  06-2012--06-2013    pt. states she has had 5 epidurals in 06-2012----06-2013  . Eye surgery      Social History   Social History  . Marital Status: Married    Spouse Name: N/A  . Number of Children: 2  . Years of Education: N/A   Occupational History  . Retired    Social History Main Topics  . Smoking status: Former Smoker -- 1.00 packs/day for 56 years    Types: Cigarettes    Quit date: 08/14/2006  . Smokeless tobacco: Never Used     Comment: x 4 yrs.  . Alcohol Use: 0.0 oz/week    0 Standard drinks or equivalent per week     Comment: Occassional glass of wine  . Drug Use: No  . Sexual Activity: No   Other Topics Concern  . Not on file   Social History Narrative    Family History  Problem Relation Age of Onset  . Alzheimer's disease Mother   . Dementia Mother   . Hypertension Mother   . Hyperlipidemia Mother   . Parkinsonism Father  Allergies as of 12/29/2014 - Review Complete 12/29/2014  Allergen Reaction Noted  . Iodine Rash and Other (See Comments) 10/24/2009  . Iohexol Rash and Other (See Comments) 01/07/2007  . Tape Itching and Rash 08/01/2011    Current Outpatient Prescriptions on File Prior to Visit  Medication Sig Dispense Refill  . alendronate (FOSAMAX) 70 MG tablet Take 1 tablet (70 mg total) by mouth every Sunday. Take in the morning with a full glass of water, on empty stomach 30 tablet 0  . amiodarone (PACERONE) 200 MG tablet Take 1 tablet by mouth  daily 30 tablet 1  . amLODipine (NORVASC) 10 MG tablet Take 10 mg by mouth daily.    Marland Kitchen aspirin EC 81 MG tablet Take 81 mg by mouth every morning.     . calcium-vitamin D (OSCAL) 250-125 MG-UNIT per tablet Take 1 tablet by mouth 2 (two) times daily.      . clopidogrel (PLAVIX) 75 MG tablet Take 1 tablet by mouth  every morning 90 tablet 1  . colesevelam (WELCHOL) 625 MG  tablet Take 3 tablets by mouth  twice a day with a meal 540 tablet 3  . irbesartan (AVAPRO) 300 MG tablet Take 300 mg by mouth daily.    . Iron-FA-B Cmp-C-Biot-Probiotic (FUSION PLUS) CAPS Take 1 capsule by mouth every morning.     . magnesium oxide (MAG-OX) 400 MG tablet Take 400 mg by mouth every morning.     . meloxicam (MOBIC) 15 MG tablet Take 15 mg by mouth every morning.      . metoprolol (LOPRESSOR) 25 MG tablet Take 75 mg (three) tablets by mouth twice a day 180 tablet 6  . Multiple Vitamin (MULTIVITAMIN WITH MINERALS) TABS tablet Take 1 tablet by mouth every morning.    . Omega-3 Fatty Acids (FISH OIL) 1200 MG CAPS Take 1,200 mg by mouth 2 (two) times daily.    . pravastatin (PRAVACHOL) 40 MG tablet Take 1 tablet by mouth  every night at bedtime 90 tablet 1  . PROAIR HFA 108 (90 BASE) MCG/ACT inhaler Inhale 2 puffs into the lungs every 6 (six) hours as needed. 1 Inhaler 4  . vitamin C (ASCORBIC ACID) 500 MG tablet Take 500 mg by mouth every morning.     . warfarin (COUMADIN) 4 MG tablet Take '4mg'$  daily except '2mg'$  on Sundays and Thursdays 90 tablet 3  . HYDROcodone-acetaminophen (NORCO/VICODIN) 5-325 MG per tablet Take 1 tablet by mouth every 6 (six) hours as needed for moderate pain.    Marland Kitchen oxyCODONE-acetaminophen (PERCOCET) 7.5-325 MG per tablet Take 0.5 tablets by mouth at bedtime as needed. For pain     No current facility-administered medications on file prior to visit.     REVIEW OF SYSTEMS: Cardiovascular: No chest pain, chest pressure, palpitations, orthopnea, or dyspnea on exertion. No claudication or rest pain,  No history of DVT or phlebitis.  Positive left leg swelling Pulmonary: No productive cough, asthma or wheezing. Neurologic: No weakness, paresthesias, aphasia, or amaurosis. No dizziness. Hematologic: No bleeding problems or clotting disorders. Musculoskeletal: No joint pain or joint swelling. Gastrointestinal: No blood in stool or hematemesis Genitourinary: No  dysuria or hematuria. Psychiatric:: No history of major depression. Integumentary: Recurrent lower extremity ulcers Constitutional: No fever or chills.  PHYSICAL EXAMINATION:   Vital signs are  Filed Vitals:   12/29/14 0939  BP: 136/82  Pulse: 74  Temp: 97 F (36.1 C)  Resp: 14  Height: '5\' 5"'$  (1.651 m)  Weight: 118 lb (53.524 kg)  SpO2: 96%   Body mass index is 19.64 kg/(m^2). General: The patient appears their stated age. HEENT:  No gross abnormalities Pulmonary:  Non labored breathing Abdomen: Soft and non-tender Musculoskeletal: There are no major deformities. Neurologic: No focal weakness or paresthesias are detected, Skin: Right lateral malleolus ulcer proximally 3 cm x 1 cm.  There is a fibrillar dissection at the base.  No significant erythema or drainage Psychiatric: The patient has normal affect. Cardiovascular: .  Bilateral pitting edema, 1-2 plus Nonpalpable pedal pulses  Diagnostic Studies I have reviewed the patient's vascular lab studies.  She has an ankle-brachial index of 0.86 on the right and 1.14 on the left.  Toe pressures are 94 on the right and 1:30 on the left  Venous evaluation was performed of the left leg there is no evidence of DVT.  No superficial vein incompetence.  There was incompetence noted in the left common femoral vein.  Assessment: #1: Left leg swelling #2: Right lower extremity ulcer Plan: #1: The patient has reflux in the left common femoral vein.  I think she also has lymphedema in the leg which could have resulted from trauma to the left leg.  Regardless treatment for this is going to be with compression stockings.  I have given her a prescription for 20-30 compression and discussed how to wear these.  #2: The patient has a relatively significant wound on her right ankle.  Doppler studies today suggest adequate blood flow to heal the wound.  I discussed that she would need this debrided.  She refuses to go to the wound center because of  the pain she suffered from previous ulcers getting debrided.  She can consider using Accuzyme as a chemical debriding agent.  She may also benefit from compression of the right leg.  Eldridge Abrahams, M.D. [facility] Office: [phone] Pager:  [phone]

## 2014-12-31 DIAGNOSIS — L89513 Pressure ulcer of right ankle, stage 3: Secondary | ICD-10-CM | POA: Diagnosis not present

## 2014-12-31 DIAGNOSIS — R748 Abnormal levels of other serum enzymes: Secondary | ICD-10-CM | POA: Diagnosis not present

## 2015-01-01 LAB — HEPATIC FUNCTION PANEL
ALT: 24 U/L (ref 6–29)
AST: 23 U/L (ref 10–35)
Albumin: 3.4 g/dL — ABNORMAL LOW (ref 3.6–5.1)
Alkaline Phosphatase: 113 U/L (ref 33–130)
BILIRUBIN TOTAL: 0.3 mg/dL (ref 0.2–1.2)
Bilirubin, Direct: 0.1 mg/dL (ref <=0.2)
Indirect Bilirubin: 0.2 mg/dL (ref 0.2–1.2)
TOTAL PROTEIN: 6.7 g/dL (ref 6.1–8.1)

## 2015-01-03 ENCOUNTER — Encounter (INDEPENDENT_AMBULATORY_CARE_PROVIDER_SITE_OTHER): Payer: Self-pay | Admitting: Internal Medicine

## 2015-01-03 ENCOUNTER — Ambulatory Visit (INDEPENDENT_AMBULATORY_CARE_PROVIDER_SITE_OTHER): Payer: Medicare Other | Admitting: Internal Medicine

## 2015-01-03 ENCOUNTER — Encounter: Payer: Self-pay | Admitting: Cardiovascular Disease

## 2015-01-03 VITALS — BP 112/82 | HR 68 | Temp 97.7°F | Ht 63.0 in | Wt 118.6 lb

## 2015-01-03 DIAGNOSIS — I6523 Occlusion and stenosis of bilateral carotid arteries: Secondary | ICD-10-CM

## 2015-01-03 DIAGNOSIS — R740 Nonspecific elevation of levels of transaminase and lactic acid dehydrogenase [LDH]: Secondary | ICD-10-CM

## 2015-01-03 DIAGNOSIS — R7401 Elevation of levels of liver transaminase levels: Secondary | ICD-10-CM

## 2015-01-03 NOTE — Progress Notes (Signed)
Subjective:    Patient ID: [name], female    DOB: 09/19/1940, 74 y.o.   MRN: [id number]  HPIHere today for f/u of her elevated transamiibeases. PCP Dr. Agapito Games ll. Pravachol was cut in half. Her transaminases are normal. She tells me she is doing good.  She has a dressing on her rt ankle for a wound. She is on Doxycyline for this but an ointment.  Her appetite is good. She has gained 18 pounds since her last visit in March. Her BMs are normal. No melena or BRRB.     11/04/2013 Hep C negative 11/16/2013 [place] RUQ: Gallbladder:  The gallbladder is adequately distended with no evidence of stones,  wall thickening, or pericholecystic fluid. A previously demonstrated  polyp is not evident today. There is no positive sonographic  Murphy's sign.  Common bile duct:  Diameter: 3.6 mm  Liver:  The liver exhibits normal echotexture with no focal mass nor ductal  dilation.  IMPRESSION:  Normal limited right upper quadrant ultrasound examination.      Hepatic Function Latest Ref Rng 12/31/2014 06/29/2014 05/21/2014  Total Protein 6.1 - 8.1 g/dL 6.7 6.9 6.6  Albumin 3.6 - 5.1 g/dL 3.4(L) 3.8 1.9(L)  AST 10 - 35 U/L $Remo'23 31 23  'qkQpP$ ALT 6 - 29 U/L $Remo'24 30 24  'OlelP$ Alk Phosphatase 33 - 130 U/L 113 96 174(H)  Total Bilirubin 0.2 - 1.2 mg/dL 0.3 0.4 0.5  Bilirubin, Direct <=0.2 mg/dL 0.1 - -        Review of Systems Past Medical History  Diagnosis Date  . Subclavian steal syndrome   . Carotid bruit     LICA 87-86% (duplex 7/67)  . Hypertension   . Hyperlipidemia   . Atrial fibrillation     x 3 yrs  . Anticoagulation management encounter   . Meningioma   . Osteoporosis   . Stroke     CVA 2008  . Pneumonia May 19, 2014  . Breast cancer     S/P left mastectomy, 1989 remained in remission  . Anemia   . Carotid artery occlusion     Past Surgical History  Procedure Laterality Date  . Cardiac catheterization    . Mastectomy Left 1990  . Cataract extraction Bilateral 2013   . Colonoscopy  11/17/2010    Procedure: COLONOSCOPY;  Surgeon: [name], MD;  Location: [facility];  Service: Endoscopy;  Laterality: N/A;  10:45 am  . Esophagogastroduodenoscopy  11/17/2010    Procedure: ESOPHAGOGASTRODUODENOSCOPY (EGD);  Surgeon: [name], MD;  Location: [facility];  Service: Endoscopy;  Laterality: N/A;  . Lumbar epidural injection  06-2012--06-2013    pt. states she has had 5 epidurals in 06-2012----06-2013  . Eye surgery      Allergies  Allergen Reactions  . Iodine Rash and Other (See Comments)    BETADINE Rash/burning, blisters on skin.  . Iohexol Rash and Other (See Comments)    Blisters; PT NEEDS 13-HOUR PREP   . Tape Itching and Rash    Prefers PAPER TAPE    Current Outpatient Prescriptions on File Prior to Visit  Medication Sig Dispense Refill  . alendronate (FOSAMAX) 70 MG tablet Take 1 tablet (70 mg total) by mouth every Sunday. Take in the morning with a full glass of water, on empty stomach 30 tablet 0  . amiodarone (PACERONE) 200 MG tablet Take 1 tablet by mouth  daily 30 tablet 1  . amLODipine (NORVASC) 10 MG tablet Take 10  mg by mouth daily.    Marland Kitchen aspirin EC 81 MG tablet Take 81 mg by mouth every morning.     . calcium-vitamin D (OSCAL) 250-125 MG-UNIT per tablet Take 1 tablet by mouth 2 (two) times daily.      . clopidogrel (PLAVIX) 75 MG tablet Take 1 tablet by mouth  every morning 90 tablet 1  . colesevelam (WELCHOL) 625 MG tablet Take 3 tablets by mouth  twice a day with a meal 540 tablet 3  . gabapentin (NEURONTIN) 100 MG capsule Take 100 mg by mouth daily.    . irbesartan (AVAPRO) 300 MG tablet Take 300 mg by mouth daily.    . Iron-FA-B Cmp-C-Biot-Probiotic (FUSION PLUS) CAPS Take 1 capsule by mouth every morning.     . magnesium oxide (MAG-OX) 400 MG tablet Take 400 mg by mouth every morning.     . meloxicam (MOBIC) 15 MG tablet Take 15 mg by mouth every morning.      . metoprolol (LOPRESSOR) 25 MG tablet Take 75 mg  (three) tablets by mouth twice a day 180 tablet 6  . Multiple Vitamin (MULTIVITAMIN WITH MINERALS) TABS tablet Take 1 tablet by mouth every morning.    . Omega-3 Fatty Acids (FISH OIL) 1200 MG CAPS Take 1,200 mg by mouth 2 (two) times daily.    . pravastatin (PRAVACHOL) 40 MG tablet Take 1 tablet by mouth  every night at bedtime 90 tablet 1  . PROAIR HFA 108 (90 BASE) MCG/ACT inhaler Inhale 2 puffs into the lungs every 6 (six) hours as needed. 1 Inhaler 4  . vitamin C (ASCORBIC ACID) 500 MG tablet Take 500 mg by mouth every morning.     . warfarin (COUMADIN) 4 MG tablet Take $RemoveBef'4mg'QzZxMKCXIZ$  daily except $RemoveBefo'2mg'DaSJffvDBwW$  on Sundays and Thursdays (Patient taking differently: Take $RemoveBeforeDEI'4mg'YbGVvBYYYeRAozZT$  daily except $RemoveBefo'2mg'aNGQTcOfkSE$  on Sundays and Thursdays (changes every week)) 90 tablet 3  . HYDROcodone-acetaminophen (NORCO/VICODIN) 5-325 MG per tablet Take 1 tablet by mouth every 6 (six) hours as needed for moderate pain.    Marland Kitchen oxyCODONE-acetaminophen (PERCOCET) 7.5-325 MG per tablet Take 0.5 tablets by mouth at bedtime as needed. For pain     No current facility-administered medications on file prior to visit.        Objective:   Physical Exam Blood pressure 112/82, pulse 68, temperature 97.7 F (36.5 C), height $RemoveBe'5\' 3"'lFYEVFYMx$  (1.6 m), weight 118 lb 9.6 oz (53.797 kg). Alert and oriented. Skin warm and dry. Oral mucosa is moist.   . Sclera anicteric, conjunctivae is pink. Thyroid not enlarged. No cervical lymphadenopathy. Lungs clear. Heart regular rate and rhythm. Murmur heard  Abdomen is soft. Bowel sounds are positive. No hepatomegaly. No abdominal masses felt. No tenderness.  No edema to lower extremities.          Assessment & Plan:  Elevated transaminases. They are normal now. She does not have a fatty liver.  OV in 1 year.

## 2015-01-03 NOTE — Progress Notes (Signed)
Patient ID: [name], female   DOB: May 07, 1940, 74 y.o.   MRN: [id number] [name] is seen for F/U of PAF, bruit, right subclavian steal and anticoagulatin She has known vascular disease primarily in the sublavian and carotids. She was admitted with syncope last year . She had PAF with diastolic heart failure. Her cath showed only diagonal disease with no critical major vessel disease. EF was normal. 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 She has 60-79% stenosis on left ICA. In combination with rapid atrial fibrillation this may have contributed to a brief loss of consdousness. Her diastolic CHF cleared with restoration of NSR. She was sent home on low dose amiodarone. She has had no SOB, or palpitations since being home. there have been no bleeding problems.   Reviewed duplex from [facility]  9/32  67-12%  LICA stenosis. Innominate right occlusion with steal  Reviewed labs from last week and AST/ALT much better in 50 range down from 120  Seen in ER 2/16 with cough  Given Augmentin but CT with mass in RLL ? necrotising pneumonia or cancer  Reviewed  Has lost about 15 lbs  Looks weak  Indicates breathing OK  Has cavitary lesion in lungs followed by pulmonary  IMPRESSION:  CT 09/08/11  Reviewed  1. Near complete collapse of the right lower lobe cavitary lesion with residual pleural-parenchymal thickening. 2. New fine ground-glass nodules in the right middle lobe consistent with pulmonary infection. 3. Stable pleural-parenchymal thickening in right upper lobe. 4. Stable small left lower lobe pulmonary nodules.  ROS: Denies fever, malais, weight loss, blurry vision, decreased visual acuity, cough, sputum, SOB, hemoptysis, pleuritic pain, palpitaitons, heartburn, abdominal pain, melena, lower extremity edema, claudication, or rash.  All other systems reviewed and negative  General: Affect appropriate Chronically ill female  HEENT:  normal Neck supple with no adenopathy JVP normal bilateral subclavian bruits and carotid  bruits no thyromegaly Lungs rhonchi decreased BS right base no  wheezing and good diaphragmatic motion Heart:  S1/S2 no murmur, no rub, gallop or click PMI normal Abdomen: benighn, BS positve, no tenderness, no AAA no bruit.  No HSM or HJR Distal pulses intact with no bruits No edema Neuro non-focal Skin warm and dry bruising  No muscular weakness   Current Outpatient Prescriptions  Medication Sig Dispense Refill  . albuterol (PROVENTIL HFA;VENTOLIN HFA) 108 (90 BASE) MCG/ACT inhaler Inhale 1-2 puffs into the lungs every 6 (six) hours as needed for wheezing or shortness of breath.    Marland Kitchen alendronate (FOSAMAX) 70 MG tablet Take 1 tablet (70 mg total) by mouth every Sunday. Take in the morning with a full glass of water, on empty stomach 30 tablet 0  . amiodarone (PACERONE) 200 MG tablet Take 1 tablet by mouth  daily 30 tablet 1  . amLODipine (NORVASC) 10 MG tablet Take 10 mg by mouth daily.    Marland Kitchen aspirin EC 81 MG tablet Take 81 mg by mouth every morning.     . calcium-vitamin D (OSCAL) 250-125 MG-UNIT per tablet Take 1 tablet by mouth 2 (two) times daily.      . clopidogrel (PLAVIX) 75 MG tablet Take 1 tablet by mouth  every morning 90 tablet 1  . colesevelam (WELCHOL) 625 MG tablet Take 3 tablets by mouth  twice a day with a meal 540 tablet 3  . gabapentin (NEURONTIN) 100 MG capsule Take 100 mg by mouth daily.    Marland Kitchen  HYDROcodone-acetaminophen (NORCO/VICODIN) 5-325 MG per tablet Take 1 tablet by mouth every 6 (six) hours as needed for moderate pain.    Marland Kitchen irbesartan (AVAPRO) 300 MG tablet Take 300 mg by mouth daily.    . Iron-FA-B Cmp-C-Biot-Probiotic (FUSION PLUS) CAPS Take 1 capsule by mouth every morning.     . magnesium oxide (MAG-OX) 400 MG tablet Take 400 mg by mouth every morning.     . meloxicam (MOBIC) 15 MG tablet Take 15 mg by mouth every morning.      . metoprolol (LOPRESSOR) 25 MG tablet  Take 75 mg (three) tablets by mouth twice a day 180 tablet 6  . Multiple Vitamin (MULTIVITAMIN WITH MINERALS) TABS tablet Take 1 tablet by mouth every morning.    . Omega-3 Fatty Acids (FISH OIL) 1200 MG CAPS Take 1,200 mg by mouth 2 (two) times daily.    Marland Kitchen oxyCODONE-acetaminophen (PERCOCET) 7.5-325 MG per tablet Take 0.5 tablets by mouth at bedtime as needed. For pain    . pravastatin (PRAVACHOL) 40 MG tablet Take 1 tablet by mouth  every night at bedtime 90 tablet 1  . vitamin C (ASCORBIC ACID) 500 MG tablet Take 500 mg by mouth every morning.     . warfarin (COUMADIN) 4 MG tablet Take '4mg'$  daily except '2mg'$  on Sundays and Thursdays (Patient taking differently: Take '4mg'$  daily except '2mg'$  on Sundays and Thursdays (changes every week)) 90 tablet 3   No current facility-administered medications for this visit.    Allergies  Iodine; Iohexol; and Tape  Electrocardiogram:  05/21/14  SR PR 276  LVH    Assessment and Plan Vascular: right innominate occlusion with steal moderate Left ICA disease.  F/u [facility] PAF:  Continue coumadin  Chol:  On statin.  Labs with primary  HTN:  On ARB well controlled  Pulmonary:  F/u CT in 3/17  Cavitary lung lesion not cancer Edema:  Reviewed ABI's and venous duplex9/16 no DVT and circulation fine mild lymphedema  F/u with me in 6 months

## 2015-01-03 NOTE — Patient Instructions (Signed)
OV 1 year.

## 2015-01-04 ENCOUNTER — Ambulatory Visit (INDEPENDENT_AMBULATORY_CARE_PROVIDER_SITE_OTHER): Payer: Medicare Other | Admitting: Cardiovascular Disease

## 2015-01-04 ENCOUNTER — Encounter: Payer: Self-pay | Admitting: Cardiovascular Disease

## 2015-01-04 VITALS — BP 110/60 | HR 72 | Ht 65.0 in | Wt 118.8 lb

## 2015-01-04 DIAGNOSIS — I10 Essential (primary) hypertension: Secondary | ICD-10-CM | POA: Diagnosis not present

## 2015-01-04 DIAGNOSIS — I6523 Occlusion and stenosis of bilateral carotid arteries: Secondary | ICD-10-CM

## 2015-01-04 DIAGNOSIS — I4891 Unspecified atrial fibrillation: Secondary | ICD-10-CM | POA: Diagnosis not present

## 2015-01-04 NOTE — Patient Instructions (Signed)
Medication Instructions:  Your physician recommends that you continue on your current medications as directed. Please refer to the Current Medication list given to you today.   Labwork: NONE  Testing/Procedures: NONE  Follow-Up: Your physician wants you to follow-up in: 6 MONTHS  WITH  DR NISHAN  You will receive a reminder letter in the mail two months in advance. If you don't receive a letter, please call our office to schedule the follow-up appointment.  Any Other Special Instructions Will Be Listed Below (If Applicable).   

## 2015-01-05 ENCOUNTER — Ambulatory Visit (INDEPENDENT_AMBULATORY_CARE_PROVIDER_SITE_OTHER): Payer: Medicare Other | Admitting: *Deleted

## 2015-01-05 ENCOUNTER — Other Ambulatory Visit: Payer: Self-pay | Admitting: Cardiovascular Disease

## 2015-01-05 DIAGNOSIS — Z5181 Encounter for therapeutic drug level monitoring: Secondary | ICD-10-CM

## 2015-01-05 DIAGNOSIS — I4891 Unspecified atrial fibrillation: Secondary | ICD-10-CM | POA: Diagnosis not present

## 2015-01-05 LAB — POCT INR: INR: 3.3

## 2015-01-14 DIAGNOSIS — L89512 Pressure ulcer of right ankle, stage 2: Secondary | ICD-10-CM | POA: Diagnosis not present

## 2015-01-17 DIAGNOSIS — Z6820 Body mass index (BMI) 20.0-20.9, adult: Secondary | ICD-10-CM | POA: Diagnosis not present

## 2015-01-17 DIAGNOSIS — M545 Low back pain: Secondary | ICD-10-CM | POA: Diagnosis not present

## 2015-01-18 ENCOUNTER — Other Ambulatory Visit: Payer: Self-pay | Admitting: Cardiovascular Disease

## 2015-01-19 ENCOUNTER — Ambulatory Visit (INDEPENDENT_AMBULATORY_CARE_PROVIDER_SITE_OTHER): Payer: Medicare Other | Admitting: *Deleted

## 2015-01-19 DIAGNOSIS — I4891 Unspecified atrial fibrillation: Secondary | ICD-10-CM | POA: Diagnosis not present

## 2015-01-19 DIAGNOSIS — Z5181 Encounter for therapeutic drug level monitoring: Secondary | ICD-10-CM

## 2015-01-19 LAB — POCT INR: INR: 5.4

## 2015-01-24 DIAGNOSIS — S41111A Laceration without foreign body of right upper arm, initial encounter: Secondary | ICD-10-CM | POA: Diagnosis not present

## 2015-01-26 ENCOUNTER — Other Ambulatory Visit (HOSPITAL_COMMUNITY): Payer: Self-pay | Admitting: Orthopaedic Surgery

## 2015-01-26 DIAGNOSIS — M545 Low back pain: Secondary | ICD-10-CM

## 2015-01-28 ENCOUNTER — Ambulatory Visit (HOSPITAL_COMMUNITY): Payer: Medicare Other

## 2015-01-31 ENCOUNTER — Ambulatory Visit (HOSPITAL_COMMUNITY)
Admission: RE | Admit: 2015-01-31 | Discharge: 2015-01-31 | Disposition: A | Discharge status: Home / Self Care | Payer: Medicare Other | Source: Ambulatory Visit | Attending: Orthopaedic Surgery | Admitting: Orthopaedic Surgery

## 2015-01-31 DIAGNOSIS — M545 Low back pain: Secondary | ICD-10-CM | POA: Diagnosis not present

## 2015-01-31 DIAGNOSIS — M4186 Other forms of scoliosis, lumbar region: Secondary | ICD-10-CM | POA: Insufficient documentation

## 2015-01-31 DIAGNOSIS — L89513 Pressure ulcer of right ankle, stage 3: Secondary | ICD-10-CM | POA: Diagnosis not present

## 2015-01-31 DIAGNOSIS — M47896 Other spondylosis, lumbar region: Secondary | ICD-10-CM | POA: Diagnosis not present

## 2015-01-31 DIAGNOSIS — M5126 Other intervertebral disc displacement, lumbar region: Secondary | ICD-10-CM | POA: Diagnosis not present

## 2015-02-02 ENCOUNTER — Ambulatory Visit (INDEPENDENT_AMBULATORY_CARE_PROVIDER_SITE_OTHER): Payer: Medicare Other | Admitting: *Deleted

## 2015-02-02 DIAGNOSIS — S22080D Wedge compression fracture of T11-T12 vertebra, subsequent encounter for fracture with routine healing: Secondary | ICD-10-CM | POA: Diagnosis not present

## 2015-02-02 DIAGNOSIS — I4891 Unspecified atrial fibrillation: Secondary | ICD-10-CM | POA: Diagnosis not present

## 2015-02-02 DIAGNOSIS — Z5181 Encounter for therapeutic drug level monitoring: Secondary | ICD-10-CM

## 2015-02-02 DIAGNOSIS — Z6820 Body mass index (BMI) 20.0-20.9, adult: Secondary | ICD-10-CM | POA: Diagnosis not present

## 2015-02-02 LAB — POCT INR: INR: 6.3

## 2015-02-11 DIAGNOSIS — E782 Mixed hyperlipidemia: Secondary | ICD-10-CM | POA: Diagnosis not present

## 2015-02-11 DIAGNOSIS — I10 Essential (primary) hypertension: Secondary | ICD-10-CM | POA: Diagnosis not present

## 2015-02-14 ENCOUNTER — Encounter: Payer: Self-pay | Admitting: Family

## 2015-02-14 ENCOUNTER — Inpatient Hospital Stay (HOSPITAL_COMMUNITY)
Admission: EM | Admit: 2015-02-14 | Discharge: 2015-02-20 | DRG: 377 | Disposition: A | Discharge status: Home Health | Payer: Medicare Other | Attending: Internal Medicine | Admitting: Internal Medicine

## 2015-02-14 ENCOUNTER — Encounter (HOSPITAL_COMMUNITY): Payer: Self-pay | Admitting: Emergency Medicine

## 2015-02-14 ENCOUNTER — Emergency Department (HOSPITAL_COMMUNITY): Payer: Medicare Other

## 2015-02-14 DIAGNOSIS — I480 Paroxysmal atrial fibrillation: Secondary | ICD-10-CM | POA: Diagnosis present

## 2015-02-14 DIAGNOSIS — I959 Hypotension, unspecified: Secondary | ICD-10-CM | POA: Diagnosis not present

## 2015-02-14 DIAGNOSIS — Z79891 Long term (current) use of opiate analgesic: Secondary | ICD-10-CM

## 2015-02-14 DIAGNOSIS — D62 Acute posthemorrhagic anemia: Secondary | ICD-10-CM | POA: Diagnosis present

## 2015-02-14 DIAGNOSIS — M545 Low back pain: Secondary | ICD-10-CM | POA: Diagnosis present

## 2015-02-14 DIAGNOSIS — I4819 Other persistent atrial fibrillation: Secondary | ICD-10-CM | POA: Diagnosis present

## 2015-02-14 DIAGNOSIS — Z91048 Other nonmedicinal substance allergy status: Secondary | ICD-10-CM

## 2015-02-14 DIAGNOSIS — R404 Transient alteration of awareness: Secondary | ICD-10-CM | POA: Diagnosis not present

## 2015-02-14 DIAGNOSIS — R579 Shock, unspecified: Secondary | ICD-10-CM | POA: Diagnosis present

## 2015-02-14 DIAGNOSIS — Z853 Personal history of malignant neoplasm of breast: Secondary | ICD-10-CM | POA: Diagnosis not present

## 2015-02-14 DIAGNOSIS — I739 Peripheral vascular disease, unspecified: Secondary | ICD-10-CM | POA: Diagnosis present

## 2015-02-14 DIAGNOSIS — I4891 Unspecified atrial fibrillation: Secondary | ICD-10-CM | POA: Diagnosis not present

## 2015-02-14 DIAGNOSIS — Z22322 Carrier or suspected carrier of Methicillin resistant Staphylococcus aureus: Secondary | ICD-10-CM

## 2015-02-14 DIAGNOSIS — K929 Disease of digestive system, unspecified: Secondary | ICD-10-CM | POA: Diagnosis not present

## 2015-02-14 DIAGNOSIS — K259 Gastric ulcer, unspecified as acute or chronic, without hemorrhage or perforation: Secondary | ICD-10-CM | POA: Diagnosis present

## 2015-02-14 DIAGNOSIS — N179 Acute kidney failure, unspecified: Secondary | ICD-10-CM | POA: Diagnosis present

## 2015-02-14 DIAGNOSIS — Z9189 Other specified personal risk factors, not elsewhere classified: Secondary | ICD-10-CM | POA: Diagnosis not present

## 2015-02-14 DIAGNOSIS — Z87891 Personal history of nicotine dependence: Secondary | ICD-10-CM

## 2015-02-14 DIAGNOSIS — K449 Diaphragmatic hernia without obstruction or gangrene: Secondary | ICD-10-CM | POA: Diagnosis present

## 2015-02-14 DIAGNOSIS — Z7902 Long term (current) use of antithrombotics/antiplatelets: Secondary | ICD-10-CM

## 2015-02-14 DIAGNOSIS — K2971 Gastritis, unspecified, with bleeding: Secondary | ICD-10-CM | POA: Diagnosis not present

## 2015-02-14 DIAGNOSIS — T45515A Adverse effect of anticoagulants, initial encounter: Secondary | ICD-10-CM | POA: Diagnosis present

## 2015-02-14 DIAGNOSIS — G8929 Other chronic pain: Secondary | ICD-10-CM | POA: Diagnosis present

## 2015-02-14 DIAGNOSIS — Z8673 Personal history of transient ischemic attack (TIA), and cerebral infarction without residual deficits: Secondary | ICD-10-CM | POA: Diagnosis not present

## 2015-02-14 DIAGNOSIS — E785 Hyperlipidemia, unspecified: Secondary | ICD-10-CM | POA: Diagnosis present

## 2015-02-14 DIAGNOSIS — D689 Coagulation defect, unspecified: Secondary | ICD-10-CM | POA: Diagnosis not present

## 2015-02-14 DIAGNOSIS — N19 Unspecified kidney failure: Secondary | ICD-10-CM | POA: Diagnosis not present

## 2015-02-14 DIAGNOSIS — D72829 Elevated white blood cell count, unspecified: Secondary | ICD-10-CM | POA: Diagnosis not present

## 2015-02-14 DIAGNOSIS — I779 Disorder of arteries and arterioles, unspecified: Secondary | ICD-10-CM | POA: Diagnosis present

## 2015-02-14 DIAGNOSIS — M810 Age-related osteoporosis without current pathological fracture: Secondary | ICD-10-CM | POA: Diagnosis present

## 2015-02-14 DIAGNOSIS — I2510 Atherosclerotic heart disease of native coronary artery without angina pectoris: Secondary | ICD-10-CM | POA: Diagnosis present

## 2015-02-14 DIAGNOSIS — Z9012 Acquired absence of left breast and nipple: Secondary | ICD-10-CM | POA: Diagnosis not present

## 2015-02-14 DIAGNOSIS — K921 Melena: Secondary | ICD-10-CM | POA: Diagnosis not present

## 2015-02-14 DIAGNOSIS — Z8249 Family history of ischemic heart disease and other diseases of the circulatory system: Secondary | ICD-10-CM

## 2015-02-14 DIAGNOSIS — R0902 Hypoxemia: Secondary | ICD-10-CM | POA: Diagnosis not present

## 2015-02-14 DIAGNOSIS — K922 Gastrointestinal hemorrhage, unspecified: Secondary | ICD-10-CM | POA: Diagnosis not present

## 2015-02-14 DIAGNOSIS — I10 Essential (primary) hypertension: Secondary | ICD-10-CM | POA: Diagnosis not present

## 2015-02-14 DIAGNOSIS — C3490 Malignant neoplasm of unspecified part of unspecified bronchus or lung: Secondary | ICD-10-CM | POA: Diagnosis not present

## 2015-02-14 DIAGNOSIS — R799 Abnormal finding of blood chemistry, unspecified: Secondary | ICD-10-CM | POA: Diagnosis not present

## 2015-02-14 DIAGNOSIS — R791 Abnormal coagulation profile: Secondary | ICD-10-CM | POA: Diagnosis present

## 2015-02-14 DIAGNOSIS — E440 Moderate protein-calorie malnutrition: Secondary | ICD-10-CM | POA: Diagnosis present

## 2015-02-14 DIAGNOSIS — J189 Pneumonia, unspecified organism: Secondary | ICD-10-CM | POA: Diagnosis present

## 2015-02-14 DIAGNOSIS — Z7901 Long term (current) use of anticoagulants: Secondary | ICD-10-CM | POA: Diagnosis not present

## 2015-02-14 DIAGNOSIS — E43 Unspecified severe protein-calorie malnutrition: Secondary | ICD-10-CM | POA: Diagnosis present

## 2015-02-14 DIAGNOSIS — M549 Dorsalgia, unspecified: Secondary | ICD-10-CM

## 2015-02-14 DIAGNOSIS — J690 Pneumonitis due to inhalation of food and vomit: Secondary | ICD-10-CM | POA: Diagnosis not present

## 2015-02-14 DIAGNOSIS — J984 Other disorders of lung: Secondary | ICD-10-CM | POA: Diagnosis present

## 2015-02-14 DIAGNOSIS — L97319 Non-pressure chronic ulcer of right ankle with unspecified severity: Secondary | ICD-10-CM | POA: Diagnosis present

## 2015-02-14 DIAGNOSIS — D473 Essential (hemorrhagic) thrombocythemia: Secondary | ICD-10-CM | POA: Diagnosis present

## 2015-02-14 DIAGNOSIS — E876 Hypokalemia: Secondary | ICD-10-CM | POA: Diagnosis present

## 2015-02-14 DIAGNOSIS — I482 Chronic atrial fibrillation: Secondary | ICD-10-CM | POA: Diagnosis not present

## 2015-02-14 DIAGNOSIS — D75839 Thrombocytosis, unspecified: Secondary | ICD-10-CM | POA: Diagnosis present

## 2015-02-14 DIAGNOSIS — Z7982 Long term (current) use of aspirin: Secondary | ICD-10-CM

## 2015-02-14 DIAGNOSIS — D594 Other nonautoimmune hemolytic anemias: Secondary | ICD-10-CM | POA: Diagnosis not present

## 2015-02-14 DIAGNOSIS — N189 Chronic kidney disease, unspecified: Secondary | ICD-10-CM | POA: Diagnosis present

## 2015-02-14 DIAGNOSIS — R918 Other nonspecific abnormal finding of lung field: Secondary | ICD-10-CM | POA: Diagnosis present

## 2015-02-14 DIAGNOSIS — R6510 Systemic inflammatory response syndrome (SIRS) of non-infectious origin without acute organ dysfunction: Secondary | ICD-10-CM | POA: Diagnosis present

## 2015-02-14 DIAGNOSIS — Z79899 Other long term (current) drug therapy: Secondary | ICD-10-CM | POA: Diagnosis not present

## 2015-02-14 DIAGNOSIS — E782 Mixed hyperlipidemia: Secondary | ICD-10-CM | POA: Diagnosis not present

## 2015-02-14 DIAGNOSIS — R531 Weakness: Secondary | ICD-10-CM | POA: Diagnosis not present

## 2015-02-14 HISTORY — DX: Spondylosis, unspecified: M47.9

## 2015-02-14 HISTORY — DX: Non-pressure chronic ulcer of unspecified part of right lower leg with unspecified severity: L97.919

## 2015-02-14 HISTORY — DX: Non-pressure chronic ulcer of unspecified part of left lower leg with unspecified severity: L97.929

## 2015-02-14 LAB — COMPREHENSIVE METABOLIC PANEL
ALBUMIN: 1.8 g/dL — AB (ref 3.5–5.0)
ALK PHOS: 117 U/L (ref 38–126)
ALT: 16 U/L (ref 14–54)
AST: 20 U/L (ref 15–41)
Anion gap: 8 (ref 5–15)
BILIRUBIN TOTAL: 0.2 mg/dL — AB (ref 0.3–1.2)
BUN: 44 mg/dL — AB (ref 6–20)
CALCIUM: 8.4 mg/dL — AB (ref 8.9–10.3)
CO2: 22 mmol/L (ref 22–32)
Chloride: 102 mmol/L (ref 101–111)
Creatinine, Ser: 1.23 mg/dL — ABNORMAL HIGH (ref 0.44–1.00)
GFR calc Af Amer: 48 mL/min — ABNORMAL LOW (ref >60)
GFR calc non Af Amer: 42 mL/min — ABNORMAL LOW (ref >60)
GLUCOSE: 145 mg/dL — AB (ref 65–99)
Potassium: 5.3 mmol/L — ABNORMAL HIGH (ref 3.5–5.1)
SODIUM: 132 mmol/L — AB (ref 135–145)
TOTAL PROTEIN: 6.3 g/dL — AB (ref 6.5–8.1)

## 2015-02-14 LAB — CBC WITH DIFFERENTIAL/PLATELET
BASOS ABS: 0.1 10*3/uL (ref 0.0–0.1)
BASOS PCT: 0 %
EOS PCT: 0 %
Eosinophils Absolute: 0 10*3/uL (ref 0.0–0.7)
HEMATOCRIT: 18.5 % — AB (ref 36.0–46.0)
Hemoglobin: 6.2 g/dL — CL (ref 12.0–15.0)
Lymphocytes Relative: 6 %
Lymphs Abs: 2.3 10*3/uL (ref 0.7–4.0)
MCH: 30.1 pg (ref 26.0–34.0)
MCHC: 33.5 g/dL (ref 30.0–36.0)
MCV: 89.8 fL (ref 78.0–100.0)
MONO ABS: 2.4 10*3/uL — AB (ref 0.1–1.0)
MONOS PCT: 6 %
NEUTROS ABS: 36.5 10*3/uL — AB (ref 1.7–7.7)
Neutrophils Relative %: 88 %
PLATELETS: 622 10*3/uL — AB (ref 150–400)
RBC: 2.06 MIL/uL — ABNORMAL LOW (ref 3.87–5.11)
RDW: 15.4 % (ref 11.5–15.5)
WBC: 41.3 10*3/uL — ABNORMAL HIGH (ref 4.0–10.5)

## 2015-02-14 LAB — PROTIME-INR
INR: >10 (ref 0.00–1.49)
Prothrombin Time: >90 seconds — ABNORMAL HIGH (ref 11.6–15.2)

## 2015-02-14 LAB — POC OCCULT BLOOD, ED: FECAL OCCULT BLD: POSITIVE — AB

## 2015-02-14 LAB — LIPASE, BLOOD: Lipase: 13 U/L (ref 11–51)

## 2015-02-14 LAB — PREPARE RBC (CROSSMATCH)

## 2015-02-14 MED ORDER — ONDANSETRON HCL 4 MG PO TABS: 4.0000 mg | ORAL_TABLET | Freq: Four times a day (QID) | ORAL | Status: DC | PRN

## 2015-02-14 MED ORDER — HYDROGEN PEROXIDE 3 % EX SOLN
CUTANEOUS | Status: AC
  Filled 2015-02-14: qty 473

## 2015-02-14 MED ORDER — ONDANSETRON HCL 4 MG/2ML IJ SOLN
4.0000 mg | Freq: Four times a day (QID) | INTRAMUSCULAR | Status: DC | PRN
  Administered 2015-02-20: 4 mg via INTRAVENOUS
  Filled 2015-02-14: qty 2

## 2015-02-14 MED ORDER — SODIUM CHLORIDE 0.9 % IV BOLUS (SEPSIS)
500.0000 mL | Freq: Once | INTRAVENOUS | Status: AC
  Administered 2015-02-14: 500 mL via INTRAVENOUS

## 2015-02-14 MED ORDER — PIPERACILLIN-TAZOBACTAM 3.375 G IVPB 30 MIN
3.3750 g | Freq: Once | INTRAVENOUS | Status: AC
  Administered 2015-02-15: 3.375 g via INTRAVENOUS
  Filled 2015-02-14: qty 50

## 2015-02-14 MED ORDER — VITAMIN K1 10 MG/ML IJ SOLN: 10.0000 mg | Freq: Once | INTRAVENOUS | Status: DC

## 2015-02-14 MED ORDER — SODIUM CHLORIDE 0.9 % IV SOLN: 10.0000 mL/h | Freq: Once | INTRAVENOUS | Status: DC

## 2015-02-14 MED ORDER — PANTOPRAZOLE SODIUM 40 MG IV SOLR
INTRAVENOUS | Status: AC
  Filled 2015-02-14: qty 160

## 2015-02-14 MED ORDER — SODIUM CHLORIDE 0.9 % IV SOLN
INTRAVENOUS | Status: DC
  Administered 2015-02-14: 21:00:00 via INTRAVENOUS

## 2015-02-14 MED ORDER — VANCOMYCIN HCL 500 MG IV SOLR: 500.0000 mg | INTRAVENOUS | Status: DC

## 2015-02-14 MED ORDER — MORPHINE SULFATE (PF) 2 MG/ML IV SOLN
1.0000 mg | INTRAVENOUS | Status: DC | PRN
  Administered 2015-02-14 – 2015-02-15 (×4): 1 mg via INTRAVENOUS
  Filled 2015-02-14 (×4): qty 1

## 2015-02-14 MED ORDER — PIPERACILLIN-TAZOBACTAM 3.375 G IVPB
3.3750 g | Freq: Three times a day (TID) | INTRAVENOUS | Status: DC
  Administered 2015-02-15 – 2015-02-20 (×14): 3.375 g via INTRAVENOUS
  Filled 2015-02-14 (×20): qty 50

## 2015-02-14 MED ORDER — SODIUM CHLORIDE 0.9 % IV SOLN
80.0000 mg | Freq: Once | INTRAVENOUS | Status: AC
  Administered 2015-02-15: 80 mg via INTRAVENOUS
  Filled 2015-02-14: qty 80

## 2015-02-14 MED ORDER — FENTANYL CITRATE (PF) 100 MCG/2ML IJ SOLN
25.0000 ug | Freq: Once | INTRAMUSCULAR | Status: AC
  Administered 2015-02-14: 25 ug via INTRAVENOUS
  Filled 2015-02-14: qty 2

## 2015-02-14 MED ORDER — DEXTROSE 5 % IV SOLN
10.0000 mg | INTRAVENOUS | Status: AC
  Administered 2015-02-14: 10 mg via INTRAVENOUS
  Filled 2015-02-14: qty 1

## 2015-02-14 MED ORDER — SODIUM CHLORIDE 0.9 % IV SOLN
INTRAVENOUS | Status: DC
  Administered 2015-02-14 – 2015-02-18 (×6): via INTRAVENOUS

## 2015-02-14 MED ORDER — ONDANSETRON HCL 4 MG/2ML IJ SOLN
4.0000 mg | Freq: Once | INTRAMUSCULAR | Status: AC
  Administered 2015-02-14: 4 mg via INTRAVENOUS
  Filled 2015-02-14: qty 2

## 2015-02-14 MED ORDER — PANTOPRAZOLE SODIUM 40 MG IV SOLR: 40.0000 mg | Freq: Two times a day (BID) | INTRAVENOUS | Status: DC

## 2015-02-14 MED ORDER — VANCOMYCIN HCL IN DEXTROSE 1-5 GM/200ML-% IV SOLN
1000.0000 mg | Freq: Once | INTRAVENOUS | Status: AC
  Administered 2015-02-15: 1000 mg via INTRAVENOUS
  Filled 2015-02-14: qty 200

## 2015-02-14 MED ORDER — SODIUM CHLORIDE 0.9 % IV SOLN
8.0000 mg/h | INTRAVENOUS | Status: DC
  Administered 2015-02-15 – 2015-02-16 (×4): 8 mg/h via INTRAVENOUS
  Filled 2015-02-14 (×8): qty 80

## 2015-02-14 MED ORDER — SODIUM CHLORIDE 0.9 % IV SOLN
10.0000 mL/h | Freq: Once | INTRAVENOUS | Status: AC
  Administered 2015-02-14: 10 mL/h via INTRAVENOUS

## 2015-02-14 MED ORDER — VITAMIN K1 10 MG/ML IJ SOLN
INTRAMUSCULAR | Status: AC
  Filled 2015-02-14: qty 1

## 2015-02-14 NOTE — ED Notes (Signed)
Report attempted. No nurse to take report.

## 2015-02-14 NOTE — ED Notes (Addendum)
Patient states she had blood work drawn on 11/04 and was told to come here today for anemia. Hemoglobin 8.3, hematocrit 25.7 on lab papers brought in by family member. Patient also states she has had black stools "for a few days" with bright red rectal bleeding starting today. Patient complains of back pain but states she has a history of back problems.

## 2015-02-14 NOTE — ED Notes (Signed)
Critical lab value   Hemoglobin:  6.2  Dr. [name] informed.

## 2015-02-14 NOTE — H&P (Signed)
Patient Demographics  [name], is a 74 y.o. female  MRN: [id number]   DOB - [birth year]  Admit Date - 02/14/2015  Outpatient Primary MD for the patient is Wende Neighbors, MD   With History of -  Past Medical History  Diagnosis Date  . Subclavian steal syndrome   . Carotid bruit     LICA 40-98% (duplex 1/19)  . Hypertension   . Hyperlipidemia   . Atrial fibrillation ([place])     x 3 yrs  . Anticoagulation management encounter   . Meningioma ([place])   . Osteoporosis   . Stroke [facility])     CVA 2008  . Pneumonia May 19, 2014  . Breast cancer ([place])     S/P left mastectomy, 1989 remained in remission  . Anemia   . Carotid artery occlusion       Past Surgical History  Procedure Laterality Date  . Cardiac catheterization    . Mastectomy Left 1990  . Cataract extraction Bilateral 2013  . Colonoscopy  11/17/2010    Procedure: COLONOSCOPY;  Surgeon: [name], MD;  Location: [facility];  Service: Endoscopy;  Laterality: N/A;  10:45 am  . Esophagogastroduodenoscopy  11/17/2010    Procedure: ESOPHAGOGASTRODUODENOSCOPY (EGD);  Surgeon: [name], MD;  Location: [facility];  Service: Endoscopy;  Laterality: N/A;  . Lumbar epidural injection  06-2012--06-2013    pt. states she has had 5 epidurals in 06-2012----06-2013  . Eye surgery      in for   Chief Complaint  Patient presents with  . Abnormal Lab Value   . Rectal Bleeding     HPI  [name]  is a 74 y.o. female, with past medical history of hypertension, carotid artery disease, atrial fibrillation on warfarin, hyperlipidemia, patient presents with complaints of hematochezia, patient reports she had recent blood work done by PCP office on November 4, hemoglobin came back at 8.3, she was told to come to the ED for further evaluation for anemia, patient reports she's been having generalized weakness and fatigue over the last few days, dark-colored stools for a few days as well, she developed bright red blood  per rectum over the last 24 hours which prompted her to come to the ED, patient is on aspirin and Plavix for carotid artery disease, as well on low back for lower back pain, and she is on warfarin for A. Fib,her INR is >10, Hgb is 6.2, WBC is 41000, denies fever, chills or cough or dysuria, his x-ray reveals recurrence of right lung lesion, husband report she had colonoscopy and endoscopy in 2012 without acute findings, hospitalist requested to admit.    Review of Systems    In addition to the HPI above,  No Fever-chills, No Headache, No changes with Vision or hearing, No problems swallowing food or Liquids, No Chest pain, Cough reports Shortness of Breath, No Abdominal pain, No Nausea or Vommittingreports melena for a few days, and hematochezia recently  No Blood in stool or Urine, No dysuria, No new skin rashes or bruises, evidence of chronic lower back pain No new weakness, tingling, numbness in any extremity, No recent weight gain or loss, No polyuria, polydypsia or polyphagia, No significant Mental Stressors.  A full 10 point Review of Systems was done, except as stated above, all other Review of Systems were negative.   Social History Social History  Substance Use Topics  . Smoking status: Former Smoker -- 1.00 packs/day for 56 years  Types: Cigarettes    Quit date: 08/14/2006  . Smokeless tobacco: Never Used     Comment: quit in 2008  . Alcohol Use: 0.0 oz/week    0 Standard drinks or equivalent per week     Comment: Occassional glass of wine     Family History Family History  Problem Relation Age of Onset  . Alzheimer's disease Mother   . Dementia Mother   . Hypertension Mother   . Hyperlipidemia Mother   . Parkinsonism Father      Prior to Admission medications   Medication Sig Start Date End Date Taking? Authorizing Provider  albuterol (PROVENTIL HFA;VENTOLIN HFA) 108 (90 BASE) MCG/ACT inhaler Inhale 1-2 puffs into the lungs every 6 (six) hours as needed  for wheezing or shortness of breath.   Yes Historical Provider, MD  alendronate (FOSAMAX) 70 MG tablet Take 1 tablet (70 mg total) by mouth every Sunday. Take in the morning with a full glass of water, on empty stomach 11/11/14  Yes Josue Hector, MD  amiodarone (PACERONE) 200 MG tablet Take 1 tablet by mouth  daily 01/18/15  Yes Josue Hector, MD  amLODipine (NORVASC) 10 MG tablet Take 10 mg by mouth every evening.  05/08/14  Yes Historical Provider, MD  aspirin EC 81 MG tablet Take 81 mg by mouth every morning.    Yes Historical Provider, MD  calcium-vitamin D (OSCAL) 250-125 MG-UNIT per tablet Take 1 tablet by mouth 2 (two) times daily.     Yes Historical Provider, MD  clopidogrel (PLAVIX) 75 MG tablet Take 1 tablet by mouth  every morning 01/05/15  Yes Josue Hector, MD  colesevelam [facility]) 625 MG tablet Take 3 tablets by mouth  twice a day with a meal Patient taking differently: Take 1,875 mg by mouth 2 (two) times daily with a meal.  07/16/14  Yes Josue Hector, MD  ferrous sulfate 325 (65 FE) MG tablet Take 325 mg by mouth daily with breakfast.   Yes Historical Provider, MD  HYDROcodone-acetaminophen (NORCO/VICODIN) 5-325 MG per tablet Take 1 tablet by mouth every 6 (six) hours as needed for moderate pain.   Yes Historical Provider, MD  irbesartan (AVAPRO) 300 MG tablet Take 300 mg by mouth every evening.  05/04/14  Yes Historical Provider, MD  Iron-FA-B Cmp-C-Biot-Probiotic (FUSION PLUS) CAPS Take 1 capsule by mouth every morning.    Yes Historical Provider, MD  magnesium oxide (MAG-OX) 400 MG tablet Take 400 mg by mouth every morning.    Yes Historical Provider, MD  meloxicam (MOBIC) 15 MG tablet Take 15 mg by mouth every morning.     Yes Historical Provider, MD  metoprolol (LOPRESSOR) 25 MG tablet Take 75 mg (three) tablets by mouth twice a day Patient taking differently: Take 75 mg by mouth 2 (two) times daily. Take 75 mg (three) tablets by mouth twice a 08/30/14  Yes Josue Hector, MD    Multiple Vitamin (MULTIVITAMIN WITH MINERALS) TABS tablet Take 1 tablet by mouth every morning.   Yes Historical Provider, MD  naloxegol oxalate (MOVANTIK) 25 MG TABS tablet Take 25 mg by mouth daily as needed.   Yes Historical Provider, MD  Omega-3 Fatty Acids (FISH OIL) 1200 MG CAPS Take 1,200 mg by mouth 2 (two) times daily.   Yes Historical Provider, MD  oxyCODONE-acetaminophen (PERCOCET) 7.5-325 MG per tablet Take 0.5 tablets by mouth at bedtime as needed. For pain 04/16/14  Yes Historical Provider, MD  pravastatin (PRAVACHOL) 40 MG tablet Take 1  tablet by mouth  every night at bedtime 09/30/14  Yes Josue Hector, MD  SANTYL ointment Apply 1 application topically daily. Applied to sore on ankle 01/17/15  Yes Historical Provider, MD  vitamin C (ASCORBIC ACID) 500 MG tablet Take 500 mg by mouth every morning.    Yes Historical Provider, MD  warfarin (COUMADIN) 4 MG tablet Take '4mg'$  daily except '2mg'$  on Sundays and Thursdays Patient taking differently: Take 2 mg by mouth every evening.  08/30/14  Yes Josue Hector, MD    Allergies  Allergen Reactions  . Iodine Rash and Other (See Comments)    BETADINE Rash/burning, blisters on skin.  . Iohexol Rash and Other (See Comments)    Blisters; PT NEEDS 13-HOUR PREP   . Tape Itching and Rash    Prefers PAPER TAPE    Physical Exam  Vitals  Blood pressure 117/68, pulse 73, temperature 97.7 F (36.5 C), temperature source Oral, resp. rate 18, height '5\' 5"'$  (1.651 m), weight 49.896 kg (110 lb), SpO2 100 %.   1. GenerFrail, elderly pale female laying in bed  in NAD,    2. Normal affect and insight, Not Suicidal or Homicidal, Awake Alert, Oriented X 3.  3. No F.N deficits, ALL C.Nerves Intact, Strength 5/5 all 4 extremities, Sensation intact all 4 extremities, Plantars down going.  4. Ears and Eyes appear Normal, Conjunctivae clear but pale , PERRdryal Mucosa.  5. Supple Neck, No JVD  6. Symmetrical Chest wall movement, Good air movement  bilaterally, CTAB.  7.  No Gallops, Rubs , No Parasternal Heave.  8. Positive Bowel Sounds, Abdomen Soft, No tenderness, No organomegaly appriciated,No rebound -guarding or rigidity.  9.  No Cyanosis, Normal Skin Turgor, No Skin Rash or Bruise.  10. Good muscle tone,  joints appear normal , no effusions, Normal ROM.    Data Review  CBC  Recent Labs Lab 02/14/15 2005  WBC 41.3*  HGB 6.2*  HCT 18.5*  PLT 622*  MCV 89.8  MCH 30.1  MCHC 33.5  RDW 15.4  LYMPHSABS 2.3  MONOABS 2.4*  EOSABS 0.0  BASOSABS 0.1   ------------------------------------------------------------------------------------------------------------------  Chemistries   Recent Labs Lab 02/14/15 2005  NA 132*  K 5.3*  CL 102  CO2 22  GLUCOSE 145*  BUN 44*  CREATININE 1.23*  CALCIUM 8.4*  AST 20  ALT 16  ALKPHOS 117  BILITOT 0.2*   ------------------------------------------------------------------------------------------------------------------ estimated creatinine clearance is 30.7 mL/min (by C-G formula based on Cr of 1.23). ------------------------------------------------------------------------------------------------------------------ No results for input(s): TSH, T4TOTAL, T3FREE, THYROIDAB in the last 72 hours.  Invalid input(s): FREET3   Coagulation profile  Recent Labs Lab 02/14/15 2005  INR >10.00*   ------------------------------------------------------------------------------------------------------------------- No results for input(s): DDIMER in the last 72 hours. -------------------------------------------------------------------------------------------------------------------  Cardiac Enzymes No results for input(s): CKMB, TROPONINI, MYOGLOBIN in the last 168 hours.  Invalid input(s): CK ------------------------------------------------------------------------------------------------------------------ Invalid input(s):  POCBNP   ---------------------------------------------------------------------------------------------------------------  Urinalysis    Component Value Date/Time   COLORURINE YELLOW 05/20/2014 2040   APPEARANCEUR CLEAR 05/20/2014 2040   LABSPEC 1.025 05/20/2014 2040   PHURINE 5.5 05/20/2014 2040   GLUCOSEU NEGATIVE 05/20/2014 2040   HGBUR TRACE* 05/20/2014 2040   BILIRUBINUR NEGATIVE 05/20/2014 2040   KETONESUR NEGATIVE 05/20/2014 2040   PROTEINUR NEGATIVE 05/20/2014 2040   UROBILINOGEN 0.2 05/20/2014 2040   NITRITE NEGATIVE 05/20/2014 2040   LEUKOCYTESUR NEGATIVE 05/20/2014 2040    ----------------------------------------------------------------------------------------------------------------  Imaging results:   Dg Chest Port 1 View  02/14/2015  CLINICAL DATA:  GI bleeding EXAM: PORTABLE CHEST 1 VIEW COMPARISON:  05/20/2014 FINDINGS: There is a dense opacity at the right base with rounded masslike appearance. Findings are similar to abnormality seen on 05/20/2014 chest CT. The left lung is well aerated. Normal heart size and stable aortic and hilar contours given technique. Changes of left mastectomy and axillary dissection. IMPRESSION: Right basilar consolidation with rounded masslike area. Appearance very similar to abnormality seen 05/20/2014, which largely resolved on 09/08/2014 CT. Recurrent pneumonia or inflammatory cavity are primary considerations given rapid recurrence. Recommend chest CT. Electronically Signed   By: Monte Fantasia M.D.   On: 02/14/2015 21:46    My personal review of EKG: Rhythm NSR, Rate  74 /min, QTc 474 , no Acute ST changes    Assessment & Plan  Active Problems:   Hyperlipemia   Essential hypertension   Atrial fibrillation (HCC)   Thrombocytosis (HCC)   Protein-calorie malnutrition, severe (HCC)   GI bleed   Carotid artery disease (HCC)   Leukocytosis   GI bleed - Unclear if this is related to upper or lower GI bleed, but this is most  likely related to aspirin, Plavix, morbid Supratherapeutic INR, patient will be admitted to intensive care unit, will be started on Protonix drip, will reverse several therapeutic INR with FFP is on vitamin K, will be kept nothing by mouth, will transfuse 2 units PRBC and then recheck hemoglobin and transfuse as needed, discussed with GI Dr Gala Romney, who will evaluate patient in a.m.  Leukocytosis - Most likely reactive especially patient is afebrile, but patient has chronically elevated white blood cells, will need evaluation by hematology when she is more stable, but at this point will obtain urinalysis, blood cultures. - Patient has cavitary  right lung lesion, she'll be started empirically on vancomycin and Zosyn until this is further evaluated with CT chest when she is more stable.  Atrial fibrillation - Currently normal sinus rhythm, continue with amiodarone, hold metoprolol giving hypotension, warfarin on hold given GI bleed.  Supratherapeutic INR/coagulopathy - Patient has been given vitamin K and FFP, continue to hold warfarin .  Acute renal failure - Continue to volume depletion, continue with hydration, avoid nephrotoxic medication.  SIRS - Patient with leukocytosis and hypotension,  this is most likely related to GI bleed, follow on septic workup .   essential hypertension  - Continue to hold all medication given she is hypotensive on presentation .  Thrombocytosis  - Chronic, at baseline .   carotid artery disease - Continue to hold aspirin and Plavix  Chronic lower back pain - Discontinue morbid, continue with low-dose morphine as needed if blood pressure allows  Severe protein calorie malnutrition - To be addressed when patient is more stable.  DVT Prophylaxis  SCDs   AM Labs Ordered, also please review Full Orders  Family Communication: Admission, patients condition and plan of care including tests being ordered have been discussed with the patient and husband  who  indicate understanding and agree with the plan and Code Status.  Code Status Full  Likely DC to  : Will admit to ICU   Condition GUARDED / critical   Time spent in minutes : 70 minutes    Juelle Dickmann M.D on 02/14/2015 at 10:23 PM  Between 7am to 7pm - Pager - [phone]  After 7pm go to [email or web address] - password [email or web address]  And look for the night coverage person covering me after hours  [facility] Office  [phone]

## 2015-02-14 NOTE — ED Notes (Signed)
CRITICAL VALUE ALERT  Critical value received:  inr >10  Date of notification:  02/14/15  Time of notification:  2114  Critical value read back:Yes.    Nurse who received alert:  Joellyn Rued, RN  MD notified (1st page):  Dr [name]  Time of first page:  2115  MD notified (2nd page):  Time of second page:  Responding MD:  Dr [name]  Time MD responded:  2115

## 2015-02-14 NOTE — ED Provider Notes (Signed)
CSN: [id number]     Arrival date & time 02/14/15  1854 History   First MD Initiated Contact with Patient 02/14/15 1930     Chief Complaint  Patient presents with  . Abnormal Lab Value   . Rectal Bleeding     (Consider location/radiation/quality/duration/timing/severity/associated sxs/prior Treatment) Patient is a 74 y.o. female presenting with hematochezia. The history is provided by the patient and the spouse.  Rectal Bleeding Associated symptoms: no abdominal pain, no fever and no vomiting    patient with blood work done on November 4 results called to then today told to come in for anemia. Hemoglobin was 8.3 hematocrit was 25.7. Patient's had black stools for several days. Today started with bright red blood bleeding has a had about 5 episodes of that. Patient complaints of back pain chronically. Patient is on blood thinners for atrial fib and previous stroke. Patient has a history of some residual weakness from the stroke. Patient denies any nausea vomiting or abdominal pain. Patient has been bruising easily for a while. Patient states that her Coumadin level was elevated the end of October.  Past Medical History  Diagnosis Date  . Subclavian steal syndrome   . Carotid bruit     LICA 63-87% (duplex 5/64)  . Hypertension   . Hyperlipidemia   . Atrial fibrillation ([place])     x 3 yrs  . Anticoagulation management encounter   . Meningioma ([place])   . Osteoporosis   . Stroke [facility])     CVA 2008  . Pneumonia May 19, 2014  . Breast cancer ([place])     S/P left mastectomy, 1989 remained in remission  . Anemia   . Carotid artery occlusion    Past Surgical History  Procedure Laterality Date  . Cardiac catheterization    . Mastectomy Left 1990  . Cataract extraction Bilateral 2013  . Colonoscopy  11/17/2010    Procedure: COLONOSCOPY;  Surgeon: [name], MD;  [facility];  Service: Endoscopy;  Laterality: N/A;  10:45 am  . Esophagogastroduodenoscopy  11/17/2010     Procedure: ESOPHAGOGASTRODUODENOSCOPY (EGD);  Surgeon: [name], MD;  Location: [facility];  Service: Endoscopy;  Laterality: N/A;  . Lumbar epidural injection  06-2012--06-2013    pt. states she has had 5 epidurals in 06-2012----06-2013  . Eye surgery     Family History  Problem Relation Age of Onset  . Alzheimer's disease Mother   . Dementia Mother   . Hypertension Mother   . Hyperlipidemia Mother   . Parkinsonism Father    Social History  Substance Use Topics  . Smoking status: Former Smoker -- 1.00 packs/day for 56 years    Types: Cigarettes    Quit date: 08/14/2006  . Smokeless tobacco: Never Used     Comment: quit in 2008  . Alcohol Use: 0.0 oz/week    0 Standard drinks or equivalent per week     Comment: Occassional glass of wine   OB History    No data available     Review of Systems  Constitutional: Positive for fatigue. Negative for fever.  HENT: Negative for congestion.   Eyes: Negative for redness.  Respiratory: Negative for shortness of breath.   Cardiovascular: Negative for chest pain.  Gastrointestinal: Positive for blood in stool and hematochezia. Negative for nausea, vomiting and abdominal pain.  Genitourinary: Negative for dysuria.  Musculoskeletal: Positive for back pain.  Skin: Positive for pallor.  Neurological: Positive for weakness.  Hematological: Bruises/bleeds easily.  Psychiatric/Behavioral: Negative for confusion.      Allergies  Iodine; Iohexol; and Tape  Home Medications   Prior to Admission medications   Medication Sig Start Date End Date Taking? Authorizing Provider  albuterol (PROVENTIL HFA;VENTOLIN HFA) 108 (90 BASE) MCG/ACT inhaler Inhale 1-2 puffs into the lungs every 6 (six) hours as needed for wheezing or shortness of breath.   Yes Historical Provider, MD  alendronate (FOSAMAX) 70 MG tablet Take 1 tablet (70 mg total) by mouth every Sunday. Take in the morning with a full glass of water, on empty stomach 11/11/14  Yes  Josue Hector, MD  amiodarone (PACERONE) 200 MG tablet Take 1 tablet by mouth  daily 01/18/15  Yes Josue Hector, MD  amLODipine (NORVASC) 10 MG tablet Take 10 mg by mouth every evening.  05/08/14  Yes Historical Provider, MD  aspirin EC 81 MG tablet Take 81 mg by mouth every morning.    Yes Historical Provider, MD  calcium-vitamin D (OSCAL) 250-125 MG-UNIT per tablet Take 1 tablet by mouth 2 (two) times daily.     Yes Historical Provider, MD  clopidogrel (PLAVIX) 75 MG tablet Take 1 tablet by mouth  every morning 01/05/15  Yes Josue Hector, MD  colesevelam [facility]) 625 MG tablet Take 3 tablets by mouth  twice a day with a meal Patient taking differently: Take 1,875 mg by mouth 2 (two) times daily with a meal.  07/16/14  Yes Josue Hector, MD  ferrous sulfate 325 (65 FE) MG tablet Take 325 mg by mouth daily with breakfast.   Yes Historical Provider, MD  HYDROcodone-acetaminophen (NORCO/VICODIN) 5-325 MG per tablet Take 1 tablet by mouth every 6 (six) hours as needed for moderate pain.   Yes Historical Provider, MD  irbesartan (AVAPRO) 300 MG tablet Take 300 mg by mouth every evening.  05/04/14  Yes Historical Provider, MD  Iron-FA-B Cmp-C-Biot-Probiotic (FUSION PLUS) CAPS Take 1 capsule by mouth every morning.    Yes Historical Provider, MD  magnesium oxide (MAG-OX) 400 MG tablet Take 400 mg by mouth every morning.    Yes Historical Provider, MD  meloxicam (MOBIC) 15 MG tablet Take 15 mg by mouth every morning.     Yes Historical Provider, MD  metoprolol (LOPRESSOR) 25 MG tablet Take 75 mg (three) tablets by mouth twice a day Patient taking differently: Take 75 mg by mouth 2 (two) times daily. Take 75 mg (three) tablets by mouth twice a 08/30/14  Yes Josue Hector, MD  Multiple Vitamin (MULTIVITAMIN WITH MINERALS) TABS tablet Take 1 tablet by mouth every morning.   Yes Historical Provider, MD  naloxegol oxalate (MOVANTIK) 25 MG TABS tablet Take 25 mg by mouth daily as needed.   Yes Historical  Provider, MD  Omega-3 Fatty Acids (FISH OIL) 1200 MG CAPS Take 1,200 mg by mouth 2 (two) times daily.   Yes Historical Provider, MD  oxyCODONE-acetaminophen (PERCOCET) 7.5-325 MG per tablet Take 0.5 tablets by mouth at bedtime as needed. For pain 04/16/14  Yes Historical Provider, MD  pravastatin (PRAVACHOL) 40 MG tablet Take 1 tablet by mouth  every night at bedtime 09/30/14  Yes Josue Hector, MD  SANTYL ointment Apply 1 application topically daily. Applied to sore on ankle 01/17/15  Yes Historical Provider, MD  vitamin C (ASCORBIC ACID) 500 MG tablet Take 500 mg by mouth every morning.    Yes Historical Provider, MD  warfarin (COUMADIN) 4 MG tablet Take '4mg'$  daily except '2mg'$  on Sundays and Thursdays Patient  taking differently: Take 2 mg by mouth every evening.  08/30/14  Yes Josue Hector, MD   BP 105/57 mmHg  Pulse 53  Temp(Src)   Resp 12  Ht '5\' 5"'$  (1.651 m)  Wt 110 lb (49.896 kg)  BMI 18.31 kg/m2  SpO2 100% Physical Exam  Constitutional: She is oriented to person, place, and time. She appears well-developed and well-nourished. She appears distressed.  HENT:  Head: Normocephalic and atraumatic.  Next membranes dry.  Eyes: Conjunctivae and EOM are normal. Pupils are equal, round, and reactive to light.  Neck: Normal range of motion.  Cardiovascular: Normal rate, regular rhythm and normal heart sounds.   No murmur heard. Pulmonary/Chest: Effort normal and breath sounds normal. No respiratory distress.  Abdominal: Soft. Bowel sounds are normal. There is no tenderness.  Genitourinary: Guaiac positive stool.  Maroon and red blood per rectum.  Musculoskeletal: Normal range of motion. She exhibits no edema.  Neurological: She is alert and oriented to person, place, and time.  Skin: There is pallor.  Marked old bruising to both upper extremities.  Nursing note and vitals reviewed.   ED Course  Procedures (including critical care time) Labs Review Labs Reviewed  COMPREHENSIVE  METABOLIC PANEL - Abnormal; Notable for the following:    Sodium 132 (*)    Potassium 5.3 (*)    Glucose, Bld 145 (*)    BUN 44 (*)    Creatinine, Ser 1.23 (*)    Calcium 8.4 (*)    Total Protein 6.3 (*)    Albumin 1.8 (*)    Total Bilirubin 0.2 (*)    GFR calc non Af Amer 42 (*)    GFR calc Af Amer 48 (*)    All other components within normal limits  CBC WITH DIFFERENTIAL/PLATELET - Abnormal; Notable for the following:    WBC 41.3 (*)    RBC 2.06 (*)    Hemoglobin 6.2 (*)    HCT 18.5 (*)    Platelets 622 (*)    Neutro Abs 36.5 (*)    Monocytes Absolute 2.4 (*)    All other components within normal limits  PROTIME-INR - Abnormal; Notable for the following:    Prothrombin Time >90.0 (*)    INR >10.00 (*)    All other components within normal limits  POC OCCULT BLOOD, ED - Abnormal; Notable for the following:    Fecal Occult Bld POSITIVE (*)    All other components within normal limits  LIPASE, BLOOD  POC OCCULT BLOOD, ED  TYPE AND SCREEN  PREPARE RBC (CROSSMATCH)  PREPARE FRESH FROZEN PLASMA   Results for orders placed or performed during the hospital encounter of 02/14/15  Lipase, blood  Result Value Ref Range   Lipase 13 11 - 51 U/L  Comprehensive metabolic panel  Result Value Ref Range   Sodium 132 (L) 135 - 145 mmol/L   Potassium 5.3 (H) 3.5 - 5.1 mmol/L   Chloride 102 101 - 111 mmol/L   CO2 22 22 - 32 mmol/L   Glucose, Bld 145 (H) 65 - 99 mg/dL   BUN 44 (H) 6 - 20 mg/dL   Creatinine, Ser 1.23 (H) 0.44 - 1.00 mg/dL   Calcium 8.4 (L) 8.9 - 10.3 mg/dL   Total Protein 6.3 (L) 6.5 - 8.1 g/dL   Albumin 1.8 (L) 3.5 - 5.0 g/dL   AST 20 15 - 41 U/L   ALT 16 14 - 54 U/L   Alkaline Phosphatase 117 38 - 126 U/L  Total Bilirubin 0.2 (L) 0.3 - 1.2 mg/dL   GFR calc non Af Amer 42 (L) >60 mL/min   GFR calc Af Amer 48 (L) >60 mL/min   Anion gap 8 5 - 15  CBC with Differential/Platelet  Result Value Ref Range   WBC 41.3 (H) 4.0 - 10.5 K/uL   RBC 2.06 (L) 3.87 - 5.11  MIL/uL   Hemoglobin 6.2 (LL) 12.0 - 15.0 g/dL   HCT 18.5 (L) 36.0 - 46.0 %   MCV 89.8 78.0 - 100.0 fL   MCH 30.1 26.0 - 34.0 pg   MCHC 33.5 30.0 - 36.0 g/dL   RDW 15.4 11.5 - 15.5 %   Platelets 622 (H) 150 - 400 K/uL   Neutrophils Relative % 88 %   Neutro Abs 36.5 (H) 1.7 - 7.7 K/uL   Lymphocytes Relative 6 %   Lymphs Abs 2.3 0.7 - 4.0 K/uL   Monocytes Relative 6 %   Monocytes Absolute 2.4 (H) 0.1 - 1.0 K/uL   Eosinophils Relative 0 %   Eosinophils Absolute 0.0 0.0 - 0.7 K/uL   Basophils Relative 0 %   Basophils Absolute 0.1 0.0 - 0.1 K/uL   WBC Morphology WHITE COUNT CONFIRMED ON SMEAR    RBC Morphology ROULEAUX    Smear Review LARGE PLATELETS PRESENT   Protime-INR  Result Value Ref Range   Prothrombin Time >90.0 (H) 11.6 - 15.2 seconds   INR >10.00 (HH) 0.00 - 1.49  POC occult blood, ED  Result Value Ref Range   Fecal Occult Bld POSITIVE (A) NEGATIVE  Type and screen [facility]  Result Value Ref Range   ABO/RH(D) A POS    Antibody Screen NEG    Sample Expiration 02/17/2015    Unit Number [id number]    Blood Component Type RED CELLS,LR    Unit division 00    Status of Unit ISSUED    Transfusion Status OK TO TRANSFUSE    Crossmatch Result Compatible    Unit Number [id number]    Blood Component Type RED CELLS,LR    Unit division 00    Status of Unit ALLOCATED    Transfusion Status OK TO TRANSFUSE    Crossmatch Result Compatible   Prepare RBC  Result Value Ref Range   Order Confirmation ORDER PROCESSED BY BLOOD BANK      Imaging Review Dg Chest Port 1 View  02/14/2015  CLINICAL DATA:  GI bleeding EXAM: PORTABLE CHEST 1 VIEW COMPARISON:  05/20/2014 FINDINGS: There is a dense opacity at the right base with rounded masslike appearance. Findings are similar to abnormality seen on 05/20/2014 chest CT. The left lung is well aerated. Normal heart size and stable aortic and hilar contours given technique. Changes of left mastectomy and axillary dissection.  IMPRESSION: Right basilar consolidation with rounded masslike area. Appearance very similar to abnormality seen 05/20/2014, which largely resolved on 09/08/2014 CT. Recurrent pneumonia or inflammatory cavity are primary considerations given rapid recurrence. Recommend chest CT. Electronically Signed   By: Monte Fantasia M.D.   On: 02/14/2015 21:46   I have personally reviewed and evaluated these images and lab results as part of my medical decision-making.   EKG Interpretation   Date/Time:  Monday February 14 2015 19:57:16 EST Ventricular Rate:  74 PR Interval:  221 QRS Duration: 83 QT Interval:  427 QTC Calculation: 474 R Axis:   54 Text Interpretation:  Sinus rhythm Prolonged PR interval Confirmed by  [name]  MD, Alley Neils ([id number]) on 02/14/2015 8:08:48 PM  CRITICAL CARE Performed by: Fredia Sorrow Total critical care time: 30 minutes Critical care time was exclusive of separately billable procedures and treating other patients. Critical care was necessary to treat or prevent imminent or life-threatening deterioration. Critical care was time spent personally by me on the following activities: development of treatment plan with patient and/or surrogate as well as nursing, discussions with consultants, evaluation of patient's response to treatment, examination of patient, obtaining history from patient or surrogate, ordering and performing treatments and interventions, ordering and review of laboratory studies, ordering and review of radiographic studies, pulse oximetry and re-evaluation of patient's condition.     MDM   Final diagnoses:  Gastrointestinal hemorrhage, unspecified gastritis, unspecified gastrointestinal hemorrhage type  Coagulopathy ([place])  Renal failure   Patient presenting with GI bleed. His had black stools for several days. Has had 5 red blood bowel movements with some hematochezia today. Patient is on Coumadin as well as Plavix. Patient has a history of some  chronic back pain.  Patient has a history of atrial fibrillation. EKG today shows no evidence of atrial fib. Patient initial blood pressure here was 88 however when she got back to the bed that were getting blood pressures of 97 are better most recent blood pressure was 105/57. That was with some fluids. Patient's hemoglobin is 6 will require blood transfusion. 2 units have been ordered first unit is starting now. Patient also has a Coumadin INR of greater than 10 patient will need fresh frozen plasma. Patient without any further GI bleeding.  Patient has some significant lab abnormalities to include a marked leukocytosis which is somewhat chronic and marked elevation of platelets which is chronic. Not clear why.  Patient also known to have a cavitary lesion followed by pulmonary and present for a while they state that it is not a neoplasm. Not sure whether there is any history of TB. Based on today's chest x-ray this is been enlargement in this lung mass. Could be a recurrent pneumonia. Patient not stable enough at the moment for CT chest. Admitting team hospitalist aware.       Fredia Sorrow, MD 02/14/15 2203

## 2015-02-14 NOTE — Progress Notes (Signed)
ANTIBIOTIC CONSULT NOTE - INITIAL  Pharmacy Consult for vancomycin and zosyn Indication: pneumonia  Allergies  Allergen Reactions  . Iodine Rash and Other (See Comments)    BETADINE Rash/burning, blisters on skin.  . Iohexol Rash and Other (See Comments)    Blisters; PT NEEDS 13-HOUR PREP   . Tape Itching and Rash    Prefers PAPER TAPE    Patient Measurements: Height: '5\' 5"'$  (165.1 cm) Weight: 110 lb (49.896 kg) IBW/kg (Calculated) : 57   Vital Signs: Temp: 97.6 F (36.4 C) (11/07 2223) Temp Source: Oral (11/07 2223) BP: 118/55 mmHg (11/07 2223) Pulse Rate: 68 (11/07 2223) Intake/Output from previous day:   Intake/Output from this shift:    Labs:  Recent Labs  02/14/15 2005  WBC 41.3*  HGB 6.2*  PLT 622*  CREATININE 1.23*   Estimated Creatinine Clearance: 30.7 mL/min (by C-G formula based on Cr of 1.23). No results for input(s): VANCOTROUGH, VANCOPEAK, VANCORANDOM, GENTTROUGH, GENTPEAK, GENTRANDOM, TOBRATROUGH, TOBRAPEAK, TOBRARND, AMIKACINPEAK, AMIKACINTROU, AMIKACIN in the last 72 hours.   Microbiology: No results found for this or any previous visit (from the past 720 hour(s)).  Medical History: Past Medical History  Diagnosis Date  . Subclavian steal syndrome   . Carotid bruit     LICA 67-89% (duplex 3/81)  . Hypertension   . Hyperlipidemia   . Atrial fibrillation ([place])     x 3 yrs  . Anticoagulation management encounter   . Meningioma ([place])   . Osteoporosis   . Stroke [facility])     CVA 2008  . Pneumonia May 19, 2014  . Breast cancer ([place])     S/P left mastectomy, 1989 remained in remission  . Anemia   . Carotid artery occlusion     Medications:  See medication history Assessment: 74 yo lady admitted with GI bleed to start broad spectrum antibiotics for PNA.  She has cavitary right lung lesion.  Her CrCl ~30 ml/min.    Goal of Therapy:  Vancomycin trough level 15-20 mcg/ml  Plan:  Zosyn 3.375 gm IV q8 hours Vancomycin 1 gm IV X 1  then 500 mg IV q24 hours F/u renal function, cultures and clinical course  Thanks for allowing pharmacy to be a part of this patient's care.  Excell Seltzer, PharmD Clinical Pharmacist 02/14/2015,10:54 PM

## 2015-02-15 ENCOUNTER — Inpatient Hospital Stay (HOSPITAL_COMMUNITY): Payer: Medicare Other

## 2015-02-15 ENCOUNTER — Encounter (HOSPITAL_COMMUNITY): Payer: Self-pay | Admitting: Physician Assistant

## 2015-02-15 DIAGNOSIS — K921 Melena: Secondary | ICD-10-CM

## 2015-02-15 DIAGNOSIS — R918 Other nonspecific abnormal finding of lung field: Secondary | ICD-10-CM | POA: Diagnosis present

## 2015-02-15 DIAGNOSIS — G8929 Other chronic pain: Secondary | ICD-10-CM | POA: Diagnosis present

## 2015-02-15 DIAGNOSIS — M549 Dorsalgia, unspecified: Secondary | ICD-10-CM

## 2015-02-15 DIAGNOSIS — N189 Chronic kidney disease, unspecified: Secondary | ICD-10-CM | POA: Diagnosis present

## 2015-02-15 DIAGNOSIS — I2510 Atherosclerotic heart disease of native coronary artery without angina pectoris: Secondary | ICD-10-CM | POA: Diagnosis present

## 2015-02-15 DIAGNOSIS — R6510 Systemic inflammatory response syndrome (SIRS) of non-infectious origin without acute organ dysfunction: Secondary | ICD-10-CM | POA: Diagnosis present

## 2015-02-15 DIAGNOSIS — R791 Abnormal coagulation profile: Secondary | ICD-10-CM | POA: Diagnosis present

## 2015-02-15 DIAGNOSIS — Z79899 Other long term (current) drug therapy: Secondary | ICD-10-CM

## 2015-02-15 DIAGNOSIS — K922 Gastrointestinal hemorrhage, unspecified: Secondary | ICD-10-CM | POA: Diagnosis present

## 2015-02-15 DIAGNOSIS — J984 Other disorders of lung: Secondary | ICD-10-CM | POA: Diagnosis present

## 2015-02-15 DIAGNOSIS — N179 Acute kidney failure, unspecified: Secondary | ICD-10-CM | POA: Diagnosis present

## 2015-02-15 LAB — URINALYSIS, ROUTINE W REFLEX MICROSCOPIC
Bilirubin Urine: NEGATIVE
Glucose, UA: NEGATIVE mg/dL
KETONES UR: NEGATIVE mg/dL
NITRITE: NEGATIVE
PH: 5 (ref 5.0–8.0)
Protein, ur: NEGATIVE mg/dL
Specific Gravity, Urine: 1.021 (ref 1.005–1.030)
Urobilinogen, UA: 0.2 mg/dL (ref 0.0–1.0)

## 2015-02-15 LAB — URINE MICROSCOPIC-ADD ON

## 2015-02-15 LAB — CBC
HEMATOCRIT: 26 % — AB (ref 36.0–46.0)
HEMOGLOBIN: 8.8 g/dL — AB (ref 12.0–15.0)
MCH: 29.9 pg (ref 26.0–34.0)
MCHC: 33.8 g/dL (ref 30.0–36.0)
MCV: 88.4 fL (ref 78.0–100.0)
Platelets: 475 10*3/uL — ABNORMAL HIGH (ref 150–400)
RBC: 2.94 MIL/uL — ABNORMAL LOW (ref 3.87–5.11)
RDW: 14.1 % (ref 11.5–15.5)
WBC: 33.1 10*3/uL — ABNORMAL HIGH (ref 4.0–10.5)

## 2015-02-15 LAB — INFLUENZA PANEL BY PCR (TYPE A & B)
H1N1 flu by pcr: NOT DETECTED
Influenza A By PCR: NEGATIVE
Influenza B By PCR: NEGATIVE

## 2015-02-15 LAB — BASIC METABOLIC PANEL
Anion gap: 10 (ref 5–15)
BUN: 31 mg/dL — AB (ref 6–20)
CHLORIDE: 105 mmol/L (ref 101–111)
CO2: 22 mmol/L (ref 22–32)
CREATININE: 0.82 mg/dL (ref 0.44–1.00)
Calcium: 8.2 mg/dL — ABNORMAL LOW (ref 8.9–10.3)
GFR calc Af Amer: >60 mL/min (ref >60)
GFR calc non Af Amer: >60 mL/min (ref >60)
Glucose, Bld: 106 mg/dL — ABNORMAL HIGH (ref 65–99)
Potassium: 4.8 mmol/L (ref 3.5–5.1)
SODIUM: 137 mmol/L (ref 135–145)

## 2015-02-15 LAB — PROTIME-INR
INR: 1.64 — AB (ref 0.00–1.49)
INR: 1.56 — AB (ref 0.00–1.49)
INR: 1.29 (ref 0.00–1.49)
PROTHROMBIN TIME: 19.5 s — AB (ref 11.6–15.2)
Prothrombin Time: 18.8 seconds — ABNORMAL HIGH (ref 11.6–15.2)
Prothrombin Time: 16.2 seconds — ABNORMAL HIGH (ref 11.6–15.2)

## 2015-02-15 LAB — ABO/RH: ABO/RH(D): A POS

## 2015-02-15 LAB — HEMOGLOBIN AND HEMATOCRIT, BLOOD
HEMATOCRIT: 22.9 % — AB (ref 36.0–46.0)
HEMOGLOBIN: 7.7 g/dL — AB (ref 12.0–15.0)

## 2015-02-15 LAB — MRSA PCR SCREENING: MRSA BY PCR: POSITIVE — AB

## 2015-02-15 LAB — HEMATOCRIT: HEMATOCRIT: 26.4 % — AB (ref 36.0–46.0)

## 2015-02-15 MED ORDER — SODIUM CHLORIDE 0.9 % IV SOLN
500.0000 mg | Freq: Two times a day (BID) | INTRAVENOUS | Status: DC
  Administered 2015-02-15 – 2015-02-19 (×7): 500 mg via INTRAVENOUS
  Filled 2015-02-15 (×9): qty 500

## 2015-02-15 MED ORDER — SODIUM CHLORIDE 3 % IN NEBU
15.0000 mL | INHALATION_SOLUTION | Freq: Once | RESPIRATORY_TRACT | Status: DC
  Filled 2015-02-15: qty 15

## 2015-02-15 MED ORDER — SODIUM CHLORIDE 0.9 % IJ SOLN
3.0000 mL | Freq: Two times a day (BID) | INTRAMUSCULAR | Status: DC
  Administered 2015-02-15 – 2015-02-16 (×3): 3 mL via INTRAVENOUS

## 2015-02-15 MED ORDER — SODIUM CHLORIDE 0.9 % IV SOLN
Freq: Once | INTRAVENOUS | Status: AC
  Administered 2015-02-15: 07:00:00 via INTRAVENOUS

## 2015-02-15 MED ORDER — IPRATROPIUM-ALBUTEROL 0.5-2.5 (3) MG/3ML IN SOLN
3.0000 mL | Freq: Four times a day (QID) | RESPIRATORY_TRACT | Status: DC
  Administered 2015-02-15 – 2015-02-16 (×4): 3 mL via RESPIRATORY_TRACT
  Filled 2015-02-15 (×4): qty 3

## 2015-02-15 MED ORDER — CETYLPYRIDINIUM CHLORIDE 0.05 % MT LIQD
7.0000 mL | Freq: Two times a day (BID) | OROMUCOSAL | Status: DC
  Administered 2015-02-15 – 2015-02-20 (×10): 7 mL via OROMUCOSAL

## 2015-02-15 MED ORDER — ALBUTEROL SULFATE (2.5 MG/3ML) 0.083% IN NEBU: 2.5000 mg | INHALATION_SOLUTION | RESPIRATORY_TRACT | Status: DC | PRN

## 2015-02-15 MED ORDER — VANCOMYCIN HCL 500 MG IV SOLR
500.0000 mg | INTRAVENOUS | Status: DC
  Filled 2015-02-15: qty 500

## 2015-02-15 MED ORDER — AMIODARONE HCL 100 MG PO TABS
200.0000 mg | ORAL_TABLET | Freq: Every day | ORAL | Status: DC
  Administered 2015-02-15 – 2015-02-20 (×6): 200 mg via ORAL
  Filled 2015-02-15: qty 1
  Filled 2015-02-15: qty 2
  Filled 2015-02-15: qty 1
  Filled 2015-02-15: qty 2
  Filled 2015-02-15 (×2): qty 1

## 2015-02-15 MED ORDER — COLLAGENASE 250 UNIT/GM EX OINT
TOPICAL_OINTMENT | Freq: Every day | CUTANEOUS | Status: DC
  Administered 2015-02-15 – 2015-02-17 (×2): via TOPICAL
  Administered 2015-02-18: 1 via TOPICAL
  Administered 2015-02-19 – 2015-02-20 (×2): via TOPICAL
  Filled 2015-02-15: qty 30

## 2015-02-15 MED ORDER — CHLORHEXIDINE GLUCONATE CLOTH 2 % EX PADS
6.0000 | MEDICATED_PAD | Freq: Every day | CUTANEOUS | Status: AC
  Administered 2015-02-15 – 2015-02-19 (×5): 6 via TOPICAL

## 2015-02-15 MED ORDER — MUPIROCIN 2 % EX OINT
1.0000 "application " | TOPICAL_OINTMENT | Freq: Two times a day (BID) | CUTANEOUS | Status: AC
  Administered 2015-02-15 – 2015-02-19 (×10): 1 via NASAL
  Filled 2015-02-15 (×2): qty 22

## 2015-02-15 NOTE — Progress Notes (Signed)
Utilization Review Completed.  

## 2015-02-15 NOTE — Progress Notes (Signed)
[place] TEAM 1 - Stepdown/ICU TEAM Progress Note  [name] CHY:[id number] DOB: Jan 30, 1941 DOA: 02/14/2015 PCP: Wende Neighbors, MD  Admit HPI / Brief Narrative: [name] is a 74 y.o.WF PMHx HTN, CAD native artery, Atrial Fibrillation on warfarin, Subclavian Steal Syndrome, Carotid Bruit, Carotid Artery Occlusion, Breast Cancer S/P left mastectomy Meningioma, HLD, chronic right ankle venous stasis ulcer  Presents with complaints of hematochezia, patient reports she had recent blood work done by PCP office on November 4, hemoglobin came back at 8.3, she was told to come to the ED for further evaluation for anemia, patient reports she's been having generalized weakness and fatigue over the last few days, dark-colored stools for a few days as well, she developed bright red blood per rectum over the last 24 hours which prompted her to come to the ED, patient is on aspirin and Plavix for carotid artery disease, as well on low back for lower back pain, and she is on warfarin for A. Fib,her INR is >10, Hgb is 6.2, WBC is 41000, denies fever, chills or cough or dysuria, his x-ray reveals recurrence of right lung lesion, husband report she had colonoscopy and endoscopy in 2012 without acute findings, hospitalist requested to admit.   HPI/Subjective: 11/8 A/O 4, positive dry cough.  Assessment/Plan: Acute GI bleed - Multifactorial to include upper/lower GI bleed, iatrogenic patient on aspirin, Plavix, warfarin -11/7 transfused 1 unit PRBC -11/7 transfused 2 units FFP -11/8 transfused 2 units FFP -EGD in a.m.  Atrial fibrillation - Currently normal sinus rhythm, continue with amiodarone, hold metoprolol giving hypotension, warfarin on hold given GI bleed.  Essential HTN   - Continue to hold all medication given she is hypotensive on presentation .  CAD native artery - Continue to hold aspirin and Plavix  Supratherapeutic INR/coagulopathy - Patient has been given vitamin K and FFP, continue  to hold warfarin .  Right lung mass/cavitary lesion -Obtain QuantiFERON blood test -Obtain induced sputum for culture -Influenza panel/respiratory virus panel -Obtain chest CT -Dependent upon findings may require PCCM to perform BAL  Acute renal failure - Continue to volume depletion, continue with hydration, avoid nephrotoxic medication.  SIRS/acute - Patient with leukocytosis and hypotension, this is most likely related to GI bleed, follow on septic workup .  Leukocytosis - Most likely reactive especially patient is afebrile, but patient has chronically elevated white blood cells, will need evaluation by hematology when she is more stable, but at this point will obtain urinalysis, blood cultures. - Patient has cavitary right lung lesion, started empirically on vancomycin and Zosyn until this is further evaluated with CT chest when she is more stable.  Thrombocytosis  - Chronic, at baseline .  Chronic lower back pain - Discontinue Mobitz, continue with low-dose morphine as needed if blood pressure allows -Per patient and husband was supposed to been evaluated by MRI, if time allows obtain MRI; NOTE patient very claustrophobic  Right ankle venous stasis ulcer -Husband states emphatically she does not want debriding. Has been using Santyl and dressing changes.  -Consult wound care  Severe protein calorie malnutrition - To be addressed when patient is more stable.     Code Status: FULL Family Communication: Husband present at time of exam Disposition Plan: Per GI    Consultants: GI  Procedure/Significant Events: 11/7 transfused one unit PRBC 11/7 transfused 2 units FFP 11/8 transfused 2 units FFP   Culture 11/7 right AC/wrist pending 11/7 MRSA by PCR positive   Antibiotics: Zosyn 11/7>> Vancomycin 11/7>>  DVT prophylaxis: SCD   Devices    LINES / TUBES:      Continuous Infusions: . sodium chloride 100 mL/hr at 02/15/15 1412  . pantoprozole  (PROTONIX) infusion 8 mg/hr (02/15/15 1415)    Objective: VITAL SIGNS: Temp: 98.1 F (36.7 C) (11/08 1200) Temp Source: Oral (11/08 1200) BP: 109/56 mmHg (11/08 1400) Pulse Rate: 106 (11/08 1400) SPO2; FIO2:   Intake/Output Summary (Last 24 hours) at 02/15/15 1919 Last data filed at 02/15/15 1121  Gross per 24 hour  Intake 2875.33 ml  Output    200 ml  Net 2675.33 ml     Exam: General: A/O 4, positive acute respiratory distress Eyes: Negative headache, eye pain, double vision,negative scleral hemorrhage ENT: Negative Runny nose, negative ear pain, negative gingival bleeding, Neck:  Negative scars, masses, torticollis, lymphadenopathy, JVD Lungs: diffuse poor air movement in all lung fields, inspiratory and expiratory wheezing right lung fields, negative crackles  Cardiovascular: Regular rate and rhythm without murmur gallop or rub normal S1 and S2 Abdomen:negative abdominal pain, nondistended, positive soft, bowel sounds, no rebound, no ascites, no appreciable mass Extremities: No significant cyanosis, clubbing, or edema bilateral lower extremities, right ankle venous stasis ulcer? Psychiatric:  Negative depression, negative anxiety, negative fatigue, negative mania  Neurologic:  Cranial nerves II through XII intact, tongue/uvula midline, all extremities muscle strength 5/5, sensation intact throughout, negative dysarthria, negative expressive aphasia, negative receptive aphasia.   Data Reviewed: Basic Metabolic Panel:  Recent Labs Lab 02/14/15 2005 02/15/15 0450  NA 132* 137  K 5.3* 4.8  CL 102 105  CO2 22 22  GLUCOSE 145* 106*  BUN 44* 31*  CREATININE 1.23* 0.82  CALCIUM 8.4* 8.2*   Liver Function Tests:  Recent Labs Lab 02/14/15 2005  AST 20  ALT 16  ALKPHOS 117  BILITOT 0.2*  PROT 6.3*  ALBUMIN 1.8*    Recent Labs Lab 02/14/15 2005  LIPASE 13   No results for input(s): AMMONIA in the last 168 hours. CBC:  Recent Labs Lab 02/14/15 2005  02/15/15 0450 02/15/15 1826  WBC 41.3* 33.1*  --   NEUTROABS 36.5*  --   --   HGB 6.2* 8.8* 7.7*  HCT 18.5* 26.0*  26.4* 22.9*  MCV 89.8 88.4  --   PLT 622* 475*  --    Cardiac Enzymes: No results for input(s): CKTOTAL, CKMB, CKMBINDEX, TROPONINI in the last 168 hours. BNP (last 3 results) No results for input(s): BNP in the last 8760 hours.  ProBNP (last 3 results) No results for input(s): PROBNP in the last 8760 hours.  CBG: No results for input(s): GLUCAP in the last 168 hours.  Recent Results (from the past 240 hour(s))  Culture, blood (routine x 2)     Status: None (Preliminary result)   Collection Time: 02/14/15 10:35 PM  Result Value Ref Range Status   Specimen Description RIGHT ANTECUBITAL  Final   Special Requests BOTTLES DRAWN AEROBIC AND ANAEROBIC 6CC  Final   Culture PENDING  Incomplete   Report Status PENDING  Incomplete  Culture, blood (routine x 2)     Status: None (Preliminary result)   Collection Time: 02/14/15 10:40 PM  Result Value Ref Range Status   Specimen Description BLOOD RIGHT WRIST  Final   Special Requests BOTTLES DRAWN AEROBIC ONLY [place]  Final   Culture PENDING  Incomplete   Report Status PENDING  Incomplete  MRSA PCR Screening     Status: Abnormal   Collection Time: 02/15/15  4:15 AM  Result Value Ref Range Status   MRSA by PCR POSITIVE (A) NEGATIVE Corrected    Comment:        The GeneXpert MRSA Assay (FDA approved for NASAL specimens only), is one component of a comprehensive MRSA colonization surveillance program. It is not intended to diagnose MRSA infection nor to guide or monitor treatment for MRSA infections. RESULT CALLED TO, READ BACK BY AND VERIFIED WITH: A PETTIFORD @ [phone] 02/08/15 MKELLY        The GeneXpert MRSA Assay (FDA approved for NASAL specimens only), is one component of a comprehensive MRSA colonization surveillance program. It is not intended to diagnose MRSA infection nor to guide or monitor treatment  for MRSA infections. CORRECTED ON 11/08 AT 1308: PREVIOUSLY REPORTED AS RESISTANT        The GeneXpert MRSA Assay (FDA approved for NASAL specimens only), is one component of a comprehensive MRSA colonization surveillance program. It is not intended to diagnose MRSA  infection nor to guide or monitor treatment for MRSA infections. RESULT CALLED TO, READ BACK BY AND VERIFIED WITH: A PETTIFORD @ [phone] 02/08/15 [place] ELLY      Studies:  Recent x-ray studies have been reviewed in detail by the Attending Physician  Scheduled Meds:  Scheduled Meds: . amiodarone  200 mg Oral Daily  . antiseptic oral rinse  7 mL Mouth Rinse BID  . Chlorhexidine Gluconate Cloth  6 each Topical Q0600  . collagenase   Topical Daily  . ipratropium-albuterol  3 mL Nebulization Q6H  . mupirocin ointment  1 application Nasal BID  . [START ON 02/18/2015] pantoprazole (PROTONIX) IV  40 mg Intravenous Q12H  . piperacillin-tazobactam (ZOSYN)  IV  3.375 g Intravenous Q8H  . sodium chloride  3 mL Intravenous Q12H  . sodium chloride HYPERTONIC  15 mL Nebulization Once  . vancomycin  500 mg Intravenous Q12H    Time spent on care of this patient: 40 mins   WOODS, Geraldo Docker , MD  [facility] Office  [phone] Pager [phone]  On-Call/Text Page:      [email or web address]      password [email or web address]  If 7PM-7AM, please contact night-coverage [email or web address] Password [email or web address] 02/15/2015, 7:19 PM   LOS: 1 day   Care during the described time interval was provided by me .  I have reviewed this patient's available data, including medical history, events of note, physical examination, and all test results as part of my evaluation. I have personally reviewed and interpreted all radiology studies.   Dia Crawford, MD [phone] Pager

## 2015-02-15 NOTE — Progress Notes (Signed)
Patient has MRSA positive pcr. Precautions have been initiated; orders made per protocol.

## 2015-02-15 NOTE — Progress Notes (Signed)
ANTIBIOTIC CONSULT NOTE  Follow up Pharmacy Consult for vancomycin and zosyn Indication: pneumonia  Allergies  Allergen Reactions  . Iodine Rash and Other (See Comments)    BETADINE Rash/burning, blisters on skin.  . Iohexol Rash and Other (See Comments)    Blisters; PT NEEDS 13-HOUR PREP   . Tape Itching and Rash    Prefers PAPER TAPE    Patient Measurements: Height: '5\' 5"'$  (165.1 cm) Weight: 116 lb 2.9 oz (52.7 kg) IBW/kg (Calculated) : 57   Vital Signs: Temp: 98.1 F (36.7 C) (11/08 1200) Temp Source: Oral (11/08 1200) BP: 99/53 mmHg (11/08 1200) Pulse Rate: 47 (11/08 1121) Intake/Output from previous day: 11/07 0701 - 11/08 0700 In: 2568.3 [I.V.:1338.3; Blood:1130; IV Piggyback:100] Out: 200 [Urine:200] Intake/Output from this shift:    Labs:  Recent Labs  02/14/15 2005 02/15/15 0450  WBC 41.3* 33.1*  HGB 6.2* 8.8*  PLT 622* 475*  CREATININE 1.23* 0.82   Estimated Creatinine Clearance: 48.6 mL/min (by C-G formula based on Cr of 0.82). No results for input(s): VANCOTROUGH, VANCOPEAK, VANCORANDOM, GENTTROUGH, GENTPEAK, GENTRANDOM, TOBRATROUGH, TOBRAPEAK, TOBRARND, AMIKACINPEAK, AMIKACINTROU, AMIKACIN in the last 72 hours.   Microbiology: Recent Results (from the past 720 hour(s))  Culture, blood (routine x 2)     Status: None (Preliminary result)   Collection Time: 02/14/15 10:35 PM  Result Value Ref Range Status   Specimen Description RIGHT ANTECUBITAL  Final   Special Requests BOTTLES DRAWN AEROBIC AND ANAEROBIC 6CC  Final   Culture PENDING  Incomplete   Report Status PENDING  Incomplete  Culture, blood (routine x 2)     Status: None (Preliminary result)   Collection Time: 02/14/15 10:40 PM  Result Value Ref Range Status   Specimen Description BLOOD RIGHT WRIST  Final   Special Requests BOTTLES DRAWN AEROBIC ONLY [place]  Final   Culture PENDING  Incomplete   Report Status PENDING  Incomplete  MRSA PCR Screening     Status: Abnormal   Collection Time:  02/15/15  4:15 AM  Result Value Ref Range Status   MRSA by PCR POSITIVE (A) NEGATIVE Corrected    Comment:        The GeneXpert MRSA Assay (FDA approved for NASAL specimens only), is one component of a comprehensive MRSA colonization surveillance program. It is not intended to diagnose MRSA infection nor to guide or monitor treatment for MRSA infections. RESULT CALLED TO, READ BACK BY AND VERIFIED WITH: A PETTIFORD @ [phone] 02/08/15 MKELLY        The GeneXpert MRSA Assay (FDA approved for NASAL specimens only), is one component of a comprehensive MRSA colonization surveillance program. It is not intended to diagnose MRSA infection nor to guide or monitor treatment for MRSA infections. CORRECTED ON 11/08 AT 6333: PREVIOUSLY REPORTED AS RESISTANT        The GeneXpert MRSA Assay (FDA approved for NASAL specimens only), is one component of a comprehensive MRSA colonization surveillance program. It is not intended to diagnose MRSA  infection nor to guide or monitor treatment for MRSA infections. RESULT CALLED TO, READ BACK BY AND VERIFIED WITH: A PETTIFORD @ 5456 02/08/15 [place] ELLY     Assessment:  74 yo F admitted with GIB on vanc/zosyn per Rx for PNA.  WBC 41.3>33.1. Creat 1.23>>0.82, afeb. Creat cl ~ 60 ml/min.   11/7 CXR: R basilar consolidation w/ rounded masslike area. Similar to 05/20/14 CXR which largely resolved on 09/08/14 CT, recurrent PNA or inflammatory cavity are possible, rec chest CT  Vanc  11/8>> Zosyn 11/8>>  10/7 BCx2>>   Goal of Therapy:  Vancomycin trough level 15-20 mcg/ml  Plan:  Increase vanc maint dose to 500 q12  Continue Zosyn 3.375 gm IV q8 F/u renal fxn, ss VT  Eudelia Bunch, Pharm.D. [phone] 02/15/2015 2:50 PM

## 2015-02-15 NOTE — Consult Note (Signed)
[facility] Consult: 11:08 AM 02/15/2015  LOS: 1 day    Referring Provider: Sherral Hammers  Primary Care Physician:  Wende Neighbors, MD Primary Gastroenterologist:  Dr. Laural Golden.       Reason for Consultation:  GI bleed   HPI: [name] is a frail 74 y.o. female.  Hx parox a fib on coumadin, htn.  subclavian and carotid stenosis, subclavian steal syndrome; failed previous stenting attempts.  CVA 2008. Leg ulcers.  Breast cancer s/p left mastectomy.  Osteoporosis. Chronic back pain and degenerative spine with compression fxs.  On Plavix and ASA. Leukocytosis. Normocytic anemia with Hgb 6.4 in 05/2014 (low iron, ferritin 658).  She takes oral iron for her history of anemia Necrotic PNA in 05/2014, improved but persistent lesion and left pulm nodule on 09/2014 follow up CT chest. . Hiatal hernia per 06/2014 CT.  S/P remote exploratory laparotomy in the 1960s for peritonitis of undetermined cause. 11/2010 Colonoscopy for IDA, FOBT +.  Non-bleeding AVM at hepatic flexure.  11/2010 EGD. Normal.   11/4 office Hgb was 8.3.  PMD sent her to ED for eval.   + fatigue, weakness. Dark stools started 11/6 and they became more burgundy on 10/7.  No nausea. Chronic anorexia.  In addition to the Plavix/ASA/Coumadin, the patient also takes Fosamax, Mobic and twice a week uses a single dose of Aleve.  Not on PPI.  She denies abdominal pain. No nausea or vomiting.  Wears dentures, able to chew her food well and denies dysphagia. Before Sunday, her bowel movements were brown. She has been using Movantic to address hydrocodone-induced constipation.  Repeat Hgb 6.2, 8.8 after PRBC x 2 total.  INR>10, corrected with FFP to 1.5.   WBC 41 K 20 K in 05/2014).  BUN to 44. FOBT +. PPI drip in place.  No PPI PTA  Initial hypotension has improved. No bm today.   She is hungry/thirsty.    Past Medical History  Diagnosis Date  . Subclavian steal syndrome   . Carotid bruit     LICA 12-24% (duplex 8/25)  . Hypertension   . Hyperlipidemia   . Atrial fibrillation ([place])     x 3 yrs  . Meningioma ([place])   . Osteoporosis   . Stroke [facility])     CVA 2008  . Pneumonia May 19, 2014  . Breast cancer ([place])     S/P left mastectomy and chemotherapy 1989 remained in remission  . Anemia 05/2014.  . Carotid artery occlusion   . Ulcers of both lower extremities ([place]) 2015  . Degenerative arthritis of spine 2015    Chronic back pain    Past Surgical History  Procedure Laterality Date  . Cardiac catheterization    . Mastectomy Left 1990  . Cataract extraction Bilateral 2013  . Colonoscopy  11/17/2010    Procedure: COLONOSCOPY;  Surgeon: [name], MD;  Location: [facility];  Service: Endoscopy;  Laterality: N/A;  10:45 am  . Esophagogastroduodenoscopy  11/17/2010    Procedure: ESOPHAGOGASTRODUODENOSCOPY (EGD);  Surgeon: [name], MD;  Location:  [facility];  Service: Endoscopy;  Laterality: N/A;  . Lumbar epidural injection  06-2012--06-2013    pt. states she has had 5 epidurals in 06-2012----06-2013  . Eye surgery    . Exploratory laparotomy  1960s    For peritonitis of undetermined cause    Prior to Admission medications   Medication Sig Start Date End Date Taking? Authorizing Provider  albuterol (PROVENTIL HFA;VENTOLIN HFA) 108 (90 BASE) MCG/ACT inhaler Inhale 1-2 puffs into the lungs every 6 (six) hours as needed for wheezing or shortness of breath.   Yes Historical Provider, MD  alendronate (FOSAMAX) 70 MG tablet Take 1 tablet (70 mg total) by mouth every Sunday. Take in the morning with a full glass of water, on empty stomach 11/11/14  Yes Josue Hector, MD  amiodarone (PACERONE) 200 MG tablet Take 1 tablet by mouth  daily 01/18/15  Yes Josue Hector, MD  amLODipine (NORVASC) 10 MG tablet Take 10 mg by mouth every evening.   05/08/14  Yes Historical Provider, MD  aspirin EC 81 MG tablet Take 81 mg by mouth every morning.    Yes Historical Provider, MD  calcium-vitamin D (OSCAL) 250-125 MG-UNIT per tablet Take 1 tablet by mouth 2 (two) times daily.     Yes Historical Provider, MD  clopidogrel (PLAVIX) 75 MG tablet Take 1 tablet by mouth  every morning 01/05/15  Yes Josue Hector, MD  colesevelam [facility]) 625 MG tablet Take 3 tablets by mouth  twice a day with a meal Patient taking differently: Take 1,875 mg by mouth 2 (two) times daily with a meal.  07/16/14  Yes Josue Hector, MD  ferrous sulfate 325 (65 FE) MG tablet Take 325 mg by mouth daily with breakfast.   Yes Historical Provider, MD  HYDROcodone-acetaminophen (NORCO/VICODIN) 5-325 MG per tablet Take 1 tablet by mouth every 6 (six) hours as needed for moderate pain.   Yes Historical Provider, MD  irbesartan (AVAPRO) 300 MG tablet Take 300 mg by mouth every evening.  05/04/14  Yes Historical Provider, MD  Iron-FA-B Cmp-C-Biot-Probiotic (FUSION PLUS) CAPS Take 1 capsule by mouth every morning.    Yes Historical Provider, MD  magnesium oxide (MAG-OX) 400 MG tablet Take 400 mg by mouth every morning.    Yes Historical Provider, MD  meloxicam (MOBIC) 15 MG tablet Take 15 mg by mouth every morning.     Yes Historical Provider, MD  metoprolol (LOPRESSOR) 25 MG tablet Take 75 mg (three) tablets by mouth twice a day Patient taking differently: Take 75 mg by mouth 2 (two) times daily. Take 75 mg (three) tablets by mouth twice a 08/30/14  Yes Josue Hector, MD  Multiple Vitamin (MULTIVITAMIN WITH MINERALS) TABS tablet Take 1 tablet by mouth every morning.   Yes Historical Provider, MD  naloxegol oxalate (MOVANTIK) 25 MG TABS tablet Take 25 mg by mouth daily as needed.   Yes Historical Provider, MD  Omega-3 Fatty Acids (FISH OIL) 1200 MG CAPS Take 1,200 mg by mouth 2 (two) times daily.   Yes Historical Provider, MD  oxyCODONE-acetaminophen (PERCOCET) 7.5-325 MG per tablet Take  0.5 tablets by mouth at bedtime as needed. For pain 04/16/14  Yes Historical Provider, MD  pravastatin (PRAVACHOL) 40 MG tablet Take 1 tablet by mouth  every night at bedtime 09/30/14  Yes Josue Hector, MD  SANTYL ointment Apply 1 application topically daily. Applied to sore on ankle 01/17/15  Yes Historical Provider, MD  vitamin C (ASCORBIC ACID) 500 MG  tablet Take 500 mg by mouth every morning.    Yes Historical Provider, MD  warfarin (COUMADIN) 4 MG tablet Take '4mg'$  daily except '2mg'$  on Sundays and Thursdays Patient taking differently: Take 2 mg by mouth every evening.  08/30/14  Yes Josue Hector, MD    Scheduled Meds: . amiodarone  200 mg Oral Daily  . antiseptic oral rinse  7 mL Mouth Rinse BID  . Chlorhexidine Gluconate Cloth  6 each Topical Q0600  . mupirocin ointment  1 application Nasal BID  . [START ON 02/18/2015] pantoprazole (PROTONIX) IV  40 mg Intravenous Q12H  . piperacillin-tazobactam (ZOSYN)  IV  3.375 g Intravenous Q8H  . sodium chloride  3 mL Intravenous Q12H  . [START ON 02/16/2015] vancomycin  500 mg Intravenous Q24H   Infusions: . sodium chloride 100 mL/hr at 02/14/15 2354  . pantoprozole (PROTONIX) infusion 8 mg/hr (02/15/15 0419)   PRN Meds: albuterol, morphine injection, ondansetron **OR** ondansetron (ZOFRAN) IV   Allergies as of 02/14/2015 - Review Complete 02/14/2015  Allergen Reaction Noted  . Iodine Rash and Other (See Comments) 10/24/2009  . Iohexol Rash and Other (See Comments) 01/07/2007  . Tape Itching and Rash 08/01/2011    Family History  Problem Relation Age of Onset  . Alzheimer's disease Mother   . Dementia Mother   . Hypertension Mother   . Hyperlipidemia Mother   . Parkinsonism Father     Social History   Social History  . Marital Status: Married    Spouse Name: N/A  . Number of Children: 2  . Years of Education: N/A   Occupational History  . Retired    Social History Main Topics  . Smoking status: Former Smoker -- 1.00  packs/day for 56 years    Types: Cigarettes    Quit date: 08/14/2006  . Smokeless tobacco: Never Used     Comment: quit in 2008  . Alcohol Use: 0.0 oz/week    0 Standard drinks or equivalent per week     Comment: Occassional glass of wine  . Drug Use: No  . Sexual Activity: No   Other Topics Concern  . Not on file   Social History Narrative    REVIEW OF SYSTEMS: Constitutional:  Generally weak. Because of back pain should she is unsteady on her feet. Has fallen maybe 3 or 4 times within the last few months. ENT:  No nose bleeds Pulm:  Chronic cough. Although mucoid, it is nonproductive.  No new DOE. Doesn't exert herself enough to really experience significant dyspnea on exertion CV:  No palpitations, no LE edema.  GU:  No hematuria, no frequency GI:  Per HPI Heme:  Per HPi   Transfusions:  Other than the transfusions in sprain 2016, and the transfusions within the last 24 hours, she has never received blood transfusions. Neuro:  No headaches, no peripheral tingling or numbness Derm:  No itching, no rash or sores.  Endocrine:  No sweats or chills.  No polyuria or dysuria Immunization:  Did not inquire. Travel:  None beyond local counties in last few months.    PHYSICAL EXAM: Vital signs in last 24 hours: Filed Vitals:   02/15/15 1010  BP: 108/57  Pulse: 87  Temp: 98.9 F (37.2 C)  Resp: 21   Wt Readings from Last 3 Encounters:  02/15/15 116 lb 2.9 oz (52.7 kg)  01/04/15 118 lb 12.8 oz (53.887 kg)  01/03/15 118 lb 9.6 oz (53.797 kg)   General: Very frail, cachectic appearing  WF. Except for her back pain, she is comfortable. Head:  No facial asymmetry or swelling  Eyes:  No scleral icterus. Conjunctiva pale. Ears:  Not HOH  Nose:  No discharge or congestion Mouth:  Poor dentition, very few teeth remaining. Dentures are not in place. Mucosa is moist and pink. No lesions or exudates Neck:  No JVD, no TMG, no masses. Lungs:  Rhonchi/crackles in the right  base. Heart: RRR. No MRG. S1/S2 audible Abdomen:  Soft, not tender. Not distended. Bowel sounds present. No masses, no fluid wave, no HSM.Marland Kitchen   Rectal: Burgundy/black, FOBT positive stool. No masses   Musc/Skeltl: Kyphosis. No joint erythema or swelling. Extremities:  Gauze bandage covers the right leg just below the knee and the right heel. These were not removed for exam.  Neurologic:  Oriented 3. Moves all 4 limbs. No tremor. No asterixis Skin:  No telangiectasia.  Purpura/heme staining extensively in the hands, forearms Tattoos:  None Nodes:  No cervical adenopathy   Psych:  Pleasant, anxious. Cooperative.  Intake/Output from previous day: 11/07 0701 - 11/08 0700 In: 2568.3 [I.V.:1338.3; Blood:1130; IV Piggyback:100] Out: 200 [Urine:200] Intake/Output this shift:    LAB RESULTS:  Recent Labs  02/14/15 2005 02/15/15 0450  WBC 41.3* 33.1*  HGB 6.2* 8.8*  HCT 18.5* 26.0*  26.4*  PLT 622* 475*   BMET Lab Results  Component Value Date   NA 137 02/15/2015   NA 132* 02/14/2015   NA 133* 06/29/2014   K 4.8 02/15/2015   K 5.3* 02/14/2015   K 4.9 06/29/2014   CL 105 02/15/2015   CL 102 02/14/2015   CL 103 06/29/2014   CO2 22 02/15/2015   CO2 22 02/14/2015   CO2 24 06/29/2014   GLUCOSE 106* 02/15/2015   GLUCOSE 145* 02/14/2015   GLUCOSE 82 06/29/2014   BUN 31* 02/15/2015   BUN 44* 02/14/2015   BUN 21 06/29/2014   CREATININE 0.82 02/15/2015   CREATININE 1.23* 02/14/2015   CREATININE 0.67 06/29/2014   CALCIUM 8.2* 02/15/2015   CALCIUM 8.4* 02/14/2015   CALCIUM 9.1 06/29/2014   LFT  Recent Labs  02/14/15 2005  PROT 6.3*  ALBUMIN 1.8*  AST 20  ALT 16  ALKPHOS 117  BILITOT 0.2*   PT/INR Lab Results  Component Value Date   INR 1.56* 02/15/2015   INR 1.64* 02/15/2015   INR >10.00* 02/14/2015   Hepatitis Panel No results for input(s): HEPBSAG, HCVAB, HEPAIGM, HEPBIGM in the last 72 hours. C-Diff No components found for: CDIFF Lipase     Component  Value Date/Time   LIPASE 13 02/14/2015 2005    Drugs of Abuse  No results found for: LABOPIA, COCAINSCRNUR, LABBENZ, AMPHETMU, THCU, LABBARB   RADIOLOGY STUDIES: Dg Chest Port 1 View  02/14/2015  CLINICAL DATA:  GI bleeding EXAM: PORTABLE CHEST 1 VIEW COMPARISON:  05/20/2014 FINDINGS: There is a dense opacity at the right base with rounded masslike appearance. Findings are similar to abnormality seen on 05/20/2014 chest CT. The left lung is well aerated. Normal heart size and stable aortic and hilar contours given technique. Changes of left mastectomy and axillary dissection. IMPRESSION: Right basilar consolidation with rounded masslike area. Appearance very similar to abnormality seen 05/20/2014, which largely resolved on 09/08/2014 CT. Recurrent pneumonia or inflammatory cavity are primary considerations given rapid recurrence. Recommend chest CT. Electronically Signed   By: Monte Fantasia M.D.   On: 02/14/2015 21:46    ENDOSCOPIC STUDIES: Per HPI  IMPRESSION:    *  GI bleed.  Suspect UGI source given the elevated BUN at initial melenic stool.  Taking Mobic, ASA, aleve.  Suspect med induced ulcer.   Colon AVM, normal EGD in 2012  *  ABL anemia, hx chronic anemia with iron deficiency.   *   Marked leukocytosis, acute on chronic.  No fever.   *  Chronic warfarin, Plavix and ASA.  INR >10, corrected with vit k and FFP to 1.5.    *  Protein malnutrition.   *  Recurrent right PNA.  On abx. Last CT shest was 09/2014.     PLAN:     *  Needs EGD late AM tomorrow.    Azucena Freed  02/15/2015, 11:08 AM Pager: [phone]     Attending Addendum: I have taken an interval history, reviewed the chart, and examined the patient. I agree with the Advanced Practitioner's note and impression. Patient with multiple co-morbidities on coumadin, aspirin, plavix, and also using NSAIDs presenting with anemia and melena, with some red stools as well. INR initially > 10 has since corrected with FFP.  She has responded appropriately to PRBC transfusion and is hemodynamically stable. On PPI at this time with anticoagulants / platelets held. Agree with EGD initially given her history is most suggestive of UGI bleed and BUN elevation. She is scheduled for tomorrow AM to have anesthesia assistance in her case. If she has recurrence of bleeding or becomes unstable in this light please contact us in the interim. Otherwise elevated WBC noted, likely due to pneumonia, recent CT chest reviewed. The indications, risks, and benefits of EGD were explained to the patient in detail. Risks include but are not limited to bleeding, perforation, adverse reaction to medications, and cardiopulmonary compromise. The patient verbalized understanding and wished to proceed. Further recommendations pending results of the exam.    Farmingdale Cellar, MD [facility] Pager [phone]

## 2015-02-15 NOTE — ED Notes (Signed)
Report given to [facility] en route to unit

## 2015-02-15 NOTE — Progress Notes (Signed)
Gave pt treatment but dry cough

## 2015-02-15 NOTE — Consult Note (Addendum)
WOC wound consult note Reason for Consult: Consult requested for right ankle.  Pt states this is a chronic wound that has been there several months. She has been followed by VVS service prior to admission. Right upper calf with partial thickness abrasion; pt states this occurred prior to admission.  1X1X.1cm, dark red, dry wound bed without odor or drainage.  Foam dressing to protect and promote healing. Wound type: Right outer ankle with full thickness wounds; there are several patchy areas which are almost connected in a location which is 3X2X.1cm, 80% yellow slough, 20% red and moist. Drainage (amount, consistency, odor) Mod amt yellow drainage, no odor. Periwound: Intact skin surrounding Dressing procedure/placement/frequency: Pt has been using Santyl for chemical debridement prior to admission.  Continue present plan of care. Pt is well-informed regarding topical treatment and plan of care.  She can resume follow-up with VVS team after discharge. Please re-consult if further assistance is needed.  Thank-you,  Julien Girt MSN, [place], [place], [place], [place]

## 2015-02-15 NOTE — Progress Notes (Signed)
Active orders for FFP ordered and started at [facility]; after transfer to [facility], RN called blood bank to inquire if FFP is ready. Crystal in blood bank stated they cannot see the orders from [facility], so [place] contacted to place new orders for [facility].

## 2015-02-16 ENCOUNTER — Ambulatory Visit: Payer: Medicare Other | Admitting: Family

## 2015-02-16 ENCOUNTER — Encounter (HOSPITAL_COMMUNITY)
Admission: EM | Disposition: A | Discharge status: Home Health | Payer: Self-pay | Source: Home / Self Care | Attending: Internal Medicine

## 2015-02-16 ENCOUNTER — Encounter (HOSPITAL_COMMUNITY): Payer: Medicare Other

## 2015-02-16 DIAGNOSIS — Z9189 Other specified personal risk factors, not elsewhere classified: Secondary | ICD-10-CM

## 2015-02-16 DIAGNOSIS — J984 Other disorders of lung: Secondary | ICD-10-CM

## 2015-02-16 DIAGNOSIS — K922 Gastrointestinal hemorrhage, unspecified: Secondary | ICD-10-CM

## 2015-02-16 DIAGNOSIS — J690 Pneumonitis due to inhalation of food and vomit: Secondary | ICD-10-CM

## 2015-02-16 DIAGNOSIS — R0902 Hypoxemia: Secondary | ICD-10-CM

## 2015-02-16 DIAGNOSIS — R791 Abnormal coagulation profile: Secondary | ICD-10-CM

## 2015-02-16 HISTORY — PX: ESOPHAGOGASTRODUODENOSCOPY: SHX5428

## 2015-02-16 LAB — PREPARE FRESH FROZEN PLASMA
UNIT DIVISION: 0
Unit division: 0
Unit division: 0
Unit division: 0
Unit division: 0

## 2015-02-16 LAB — TYPE AND SCREEN
ABO/RH(D): A POS
Antibody Screen: NEGATIVE
UNIT DIVISION: 0
UNIT DIVISION: 0

## 2015-02-16 LAB — HEMOGLOBIN AND HEMATOCRIT, BLOOD
HCT: 22.6 % — ABNORMAL LOW (ref 36.0–46.0)
HCT: 22.2 % — ABNORMAL LOW (ref 36.0–46.0)
HEMOGLOBIN: 7.5 g/dL — AB (ref 12.0–15.0)
Hemoglobin: 7.6 g/dL — ABNORMAL LOW (ref 12.0–15.0)

## 2015-02-16 LAB — PREPARE RBC (CROSSMATCH)

## 2015-02-16 LAB — PROTIME-INR
INR: 1.43 (ref 0.00–1.49)
Prothrombin Time: 17.5 seconds — ABNORMAL HIGH (ref 11.6–15.2)

## 2015-02-16 SURGERY — EGD (ESOPHAGOGASTRODUODENOSCOPY)
Anesthesia: Moderate Sedation

## 2015-02-16 MED ORDER — MORPHINE SULFATE (PF) 2 MG/ML IV SOLN: 1.0000 mg | INTRAVENOUS | Status: DC | PRN

## 2015-02-16 MED ORDER — IPRATROPIUM-ALBUTEROL 0.5-2.5 (3) MG/3ML IN SOLN
3.0000 mL | RESPIRATORY_TRACT | Status: DC | PRN
  Administered 2015-02-17 – 2015-02-18 (×2): 3 mL via RESPIRATORY_TRACT
  Filled 2015-02-16 (×2): qty 3

## 2015-02-16 MED ORDER — EPINEPHRINE HCL 0.1 MG/ML IJ SOSY
PREFILLED_SYRINGE | INTRAMUSCULAR | Status: AC
  Filled 2015-02-16: qty 10

## 2015-02-16 MED ORDER — OXYCODONE HCL 5 MG PO TABS
5.0000 mg | ORAL_TABLET | ORAL | Status: DC | PRN
  Administered 2015-02-16 – 2015-02-20 (×14): 5 mg via ORAL
  Filled 2015-02-16 (×15): qty 1

## 2015-02-16 MED ORDER — SODIUM CHLORIDE 0.9 % IV SOLN: INTRAVENOUS | Status: DC

## 2015-02-16 MED ORDER — PANTOPRAZOLE SODIUM 40 MG PO TBEC: 40.0000 mg | DELAYED_RELEASE_TABLET | Freq: Two times a day (BID) | ORAL | Status: DC

## 2015-02-16 MED ORDER — SODIUM CHLORIDE 0.9 % IV BOLUS (SEPSIS)
500.0000 mL | Freq: Once | INTRAVENOUS | Status: AC
  Administered 2015-02-16: 500 mL via INTRAVENOUS

## 2015-02-16 MED ORDER — SODIUM CHLORIDE 0.9 % IV SOLN: Freq: Once | INTRAVENOUS | Status: DC

## 2015-02-16 MED ORDER — MIDAZOLAM HCL 5 MG/ML IJ SOLN
INTRAMUSCULAR | Status: AC
  Filled 2015-02-16: qty 2

## 2015-02-16 MED ORDER — MIDAZOLAM HCL 10 MG/2ML IJ SOLN
INTRAMUSCULAR | Status: DC | PRN
  Administered 2015-02-16 (×4): 1 mg via INTRAVENOUS

## 2015-02-16 MED ORDER — FENTANYL CITRATE (PF) 100 MCG/2ML IJ SOLN
INTRAMUSCULAR | Status: AC
  Filled 2015-02-16: qty 2

## 2015-02-16 MED ORDER — DIPHENHYDRAMINE HCL 50 MG/ML IJ SOLN
INTRAMUSCULAR | Status: AC
  Filled 2015-02-16: qty 1

## 2015-02-16 MED ORDER — PANTOPRAZOLE SODIUM 40 MG PO TBEC
40.0000 mg | DELAYED_RELEASE_TABLET | Freq: Two times a day (BID) | ORAL | Status: DC
  Administered 2015-02-16 – 2015-02-20 (×8): 40 mg via ORAL
  Filled 2015-02-16 (×8): qty 1

## 2015-02-16 MED ORDER — FENTANYL CITRATE (PF) 100 MCG/2ML IJ SOLN
INTRAMUSCULAR | Status: DC | PRN
  Administered 2015-02-16 (×3): 25 ug via INTRAVENOUS

## 2015-02-16 NOTE — Progress Notes (Signed)
[place] TEAM 1 - Stepdown/ICU TEAM PROGRESS NOTE  [name] ZOX:[id number] DOB: 06-25-1940 DOA: 02/14/2015 PCP: Wende Neighbors, MD  Admit HPI / Brief Narrative: 74 y.o. F Hx HTN, CAD, Atrial Fibrillation on warfarin, Subclavian Steal Syndrome, Carotid Artery Occlusion, Breast Cancer S/P left mastectomy, Meningioma, HLD, and a chronic right ankle venous stasis ulcer who presented with complaints of hematochezia.  She had blood work done by her PCP on November 4, and when her hemoglobin came back at 8.3 she was told to come to the ED for further evaluation.  Patient reported generalized weakness and fatigue over a few days, dark-colored stools for a few days, and then bright red blood per rectum over 24 hours.  Patient is on aspirin and Plavix for carotid artery disease, as well as warfarin for A. Fib.  In the ED her INR was >10, Hgb 6.2.  CXR revealed a cavitary R lung lesion.    HPI/Subjective: The patient complains of an acute exacerbation of her chronic severe low back pain.  She did have a bowel movement this morning which was maroon colored.  She denies chest pain shortness of breath nausea or vomiting.  She admits to having a very poor appetite over the last 2 weeks.  Assessment/Plan:  Acute GI bleed -elements suggestive of both UGIB and LGIB - for endoscopic evaluation  -patient on aspirin, Plavix, warfarin -transfused 2 units PRBC thus far - to get 2 additional units today  -transfused 5 units FFP thus far   Acute blood loss anemia -transfuse prn to keep Hgb 8.0 or > (hx of CAD)  SIRS -  Hemorrhagic shock  BP improving - keep hydrated and transfuse to goal Hgb of 8.0   Supratherapeutic INR / warfarin induced coagulopathy -Patient has been given vitamin K and FFP - INR now corrected   Atrial fibrillation -NSR presently - continue with amiodarone - hold metoprolol given hypotension - warfarin on hold given GI bleed  Essential HTN  -not an active issue at present - follow trend     CAD native artery -continue to hold aspirin and Plavix - no SSCP presently - further historical details not clear   RLL lung mass / cavitary lesion - 9.5 x 6.7cm -previously noted (Feb 2016), resolved (June 2016), and has now recurred  -Quantiferon gold pending  -Obtain induced sputum for culture -Influenza panel/respiratory virus panel -Obtain chest CT -Dependent upon findings may require PCCM to perform BAL -was previously being worked up as outpt by Dr. Chase Caller - will ask PCCM to see while here   Acute renal failure -due to volume depletion - resolved   Leukocytosis -likely related to RLL cavitary PNA - cont empiric abx - PCCM to see   Thrombocytosis  -chronic - at baseline  Chronic lower back pain -Per patient and husband was supposed to been evaluated by MRI, if time allows obtain MRI; NOTE patient very claustrophobic - severe compression fx noted at T12, along w/ mod-severe new T10 compression fx   Right ankle venous stasis ulcer -Husband states emphatically she does not want debriding - has been using Santyl and dressing changes- WOC to see   Severe protein calorie malnutrition -to be addressed when patient is more stable  MRSA screen +  Code Status: FULL Family Communication: spoke w/ husband at bedside  Disposition Plan: SDU   Consultants: [facility]  PCCM  Procedures: EGD - pending   Antibiotics: Zosyn 11/7 > Vancomycin 11/7 >  DVT prophylaxis: SCDs  Objective:  Blood pressure 98/60, pulse 96, temperature 97.3 F (36.3 C), temperature source Oral, resp. rate 19, height '5\' 5"'$  (1.651 m), weight 52.7 kg (116 lb 2.9 oz), SpO2 96 %.  Intake/Output Summary (Last 24 hours) at 02/16/15 1027 Last data filed at 02/16/15 0700  Gross per 24 hour  Intake   2982 ml  Output    225 ml  Net   2757 ml   Exam: General: No acute respiratory distress Lungs: Clear to auscultation bilaterally without wheezes or crackles Cardiovascular: Regular rate and rhythm  without murmur gallop or rub normal S1 and S2 Abdomen: Nontender, nondistended, soft, bowel sounds positive, no rebound, no ascites, no appreciable mass Extremities: No significant cyanosis, clubbing, or edema bilateral lower extremities  Data Reviewed: Basic Metabolic Panel:  Recent Labs Lab 02/14/15 2005 02/15/15 0450  NA 132* 137  K 5.3* 4.8  CL 102 105  CO2 22 22  GLUCOSE 145* 106*  BUN 44* 31*  CREATININE 1.23* 0.82  CALCIUM 8.4* 8.2*    CBC:  Recent Labs Lab 02/14/15 2005 02/15/15 0450 02/15/15 1826 02/16/15 0247 02/16/15 0915  WBC 41.3* 33.1*  --   --   --   NEUTROABS 36.5*  --   --   --   --   HGB 6.2* 8.8* 7.7* 7.6* 7.5*  HCT 18.5* 26.0*  26.4* 22.9* 22.6* 22.2*  MCV 89.8 88.4  --   --   --   PLT 622* 475*  --   --   --     Liver Function Tests:  Recent Labs Lab 02/14/15 2005  AST 20  ALT 16  ALKPHOS 117  BILITOT 0.2*  PROT 6.3*  ALBUMIN 1.8*    Recent Labs Lab 02/14/15 2005  LIPASE 13   Coags:  Recent Labs Lab 02/14/15 2005 02/15/15 0450 02/15/15 0625 02/15/15 1421  INR >10.00* 1.64* 1.56* 1.29    Recent Results (from the past 240 hour(s))  Culture, blood (routine x 2)     Status: None (Preliminary result)   Collection Time: 02/14/15 10:35 PM  Result Value Ref Range Status   Specimen Description RIGHT ANTECUBITAL  Final   Special Requests BOTTLES DRAWN AEROBIC AND ANAEROBIC 6CC  Final   Culture PENDING  Incomplete   Report Status PENDING  Incomplete  Culture, blood (routine x 2)     Status: None (Preliminary result)   Collection Time: 02/14/15 10:40 PM  Result Value Ref Range Status   Specimen Description BLOOD RIGHT WRIST  Final   Special Requests BOTTLES DRAWN AEROBIC ONLY 6CC  Final   Culture PENDING  Incomplete   Report Status PENDING  Incomplete  MRSA PCR Screening     Status: Abnormal   Collection Time: 02/15/15  4:15 AM  Result Value Ref Range Status   MRSA by PCR POSITIVE (A) NEGATIVE Corrected    Comment:          The GeneXpert MRSA Assay (FDA approved for NASAL specimens only), is one component of a comprehensive MRSA colonization surveillance program. It is not intended to diagnose MRSA infection nor to guide or monitor treatment for MRSA infections. RESULT CALLED TO, READ BACK BY AND VERIFIED WITH: A PETTIFORD @ 8182 02/08/15 MKELLY        The GeneXpert MRSA Assay (FDA approved for NASAL specimens only), is one component of a comprehensive MRSA colonization surveillance program. It is not intended to diagnose MRSA infection nor to guide or monitor treatment for MRSA infections. CORRECTED ON 11/08 AT 9937: PREVIOUSLY  REPORTED AS RESISTANT        The GeneXpert MRSA Assay (FDA approved for NASAL specimens only), is one component of a comprehensive MRSA colonization surveillance program. It is not intended to diagnose MRSA  infection nor to guide or monitor treatment for MRSA infections. RESULT CALLED TO, READ BACK BY AND VERIFIED WITH: A PETTIFORD @ 9093 02/08/15 [place] ELLY      Studies:   Recent x-ray studies have been reviewed in detail by the Attending Physician  Scheduled Meds:  Scheduled Meds: . amiodarone  200 mg Oral Daily  . antiseptic oral rinse  7 mL Mouth Rinse BID  . Chlorhexidine Gluconate Cloth  6 each Topical Q0600  . collagenase   Topical Daily  . ipratropium-albuterol  3 mL Nebulization Q6H  . mupirocin ointment  1 application Nasal BID  . [START ON 02/18/2015] pantoprazole (PROTONIX) IV  40 mg Intravenous Q12H  . piperacillin-tazobactam (ZOSYN)  IV  3.375 g Intravenous Q8H  . sodium chloride  3 mL Intravenous Q12H  . sodium chloride HYPERTONIC  15 mL Nebulization Once  . vancomycin  500 mg Intravenous Q12H    Time spent on care of this patient: 35 mins   Lezette Kitts T , MD   [facility] Office  [phone] Pager - Text Page per Shea Evans as per below:  On-Call/Text Page:      [email or web address]      password [email or web address]  If 7PM-7AM, please contact  night-coverage [email or web address] Password [email or web address] 02/16/2015, 10:27 AM   LOS: 2 days

## 2015-02-16 NOTE — Progress Notes (Signed)
#  2 Unit of blood started.

## 2015-02-16 NOTE — Op Note (Signed)
[facility] [place], [address]   ENDOSCOPY PROCEDURE REPORT  PATIENT: [name], [name]  MR#: [id number] BIRTHDATE: 05/01/1940 , 47  yrs. old GENDER: female ENDOSCOPIST: Yetta Flock, MD REFERRED BY: PROCEDURE DATE:  02/16/2015 PROCEDURE:  EGD, diagnostic ASA CLASS:     Class III INDICATIONS:  melena and anemia secondary to acute blood loss. MEDICATIONS: Fentanyl 75 mcg IV and Versed 4 mg IV TOPICAL ANESTHETIC:  DESCRIPTION OF PROCEDURE: After the risks benefits and alternatives of the procedure were thoroughly explained, informed consent was obtained.  The    endoscope was introduced through the mouth and advanced to the second portion of the duodenum , Without limitations.  The instrument was slowly withdrawn as the mucosa was fully examined.    FINDINGS: The esophagus appeared normal.  There was a 5cm hiatal hernia.  DH noted at 38cm from the incisors, with GEJ and SCJ noted 33cm from the incisors.  The stomach was remarkable for numerous small linear clean based ulcerations throughout the gastric body and antrum, in association with patchy erythema.  There was no evidence of active bleeding or high risk stigmata for bleeding. There were a few small (few mm) clean based ulcers in the duodenal bulb, without high risk stigmata for bleeding, and a normal 2nd portion duodenum.  Retroflexed views revealed a hiatal hernia. The scope was then withdrawn from the patient and the procedure completed.  COMPLICATIONS: There were no immediate complications.  ENDOSCOPIC IMPRESSION: Normal esophagus 5cm hiatal hernia Gastritis with numerous clean based linear ulcers of the stomach, with a few clean based ulcers in the duodenal bulb  Overall, suspect the patient had bleeding from these findings in the setting of supratherapeutic INR and plavix/aspirin use. Unclear if ulcerations are due to aspirin / NSAIDs vs. H  pylori  RECOMMENDATIONS: Continue IV protonix, can switch to '40mg'$  twice daily Clear liquid diet okay Trend H/H, coags Avoid NSAIDs if possible H pylori IgG serology - treat if positive Please call with questions or changes in her status    eSigned:  Yetta Flock, MD 02/16/2015 12:31 PM    CC:  PATIENT NAME:  [name], [name] MR#: [id number]

## 2015-02-16 NOTE — Progress Notes (Signed)
Miscommunication when wasting medications at [facility].  Fentanyl '75mg'$  given IV, wasted 25 mg with Sharon Mt, tech.  Waste in [facility] listed as '25mg'$  given with '75mg'$  wasted.  Elna Breslow, RN, charge for endo aware.

## 2015-02-16 NOTE — Interval H&P Note (Signed)
History and Physical Interval Note:  02/16/2015 11:26 AM  [name]  has presented today for surgery, with the diagnosis of gi bleed, anemia.  The various methods of treatment have been discussed with the patient and family. After consideration of risks, benefits and other options for treatment, the patient has consented to  Procedure(s): ESOPHAGOGASTRODUODENOSCOPY (EGD) (N/A) as a surgical intervention .  The patient's history has been reviewed, patient examined, no change in status, stable for surgery.  I have reviewed the patient's chart and labs.  Questions were answered to the patient's satisfaction.     Renelda Loma Armbruster

## 2015-02-16 NOTE — H&P (View-Only) (Signed)
[facility] Consult: 11:08 AM 02/15/2015  LOS: 1 day    Referring Provider: Sherral Hammers  Primary Care Physician:  Wende Neighbors, MD Primary Gastroenterologist:  Dr. Laural Golden.       Reason for Consultation:  GI bleed   HPI: [name] is a frail 74 y.o. female.  Hx parox a fib on coumadin, htn.  subclavian and carotid stenosis, subclavian steal syndrome; failed previous stenting attempts.  CVA 2008. Leg ulcers.  Breast cancer s/p left mastectomy.  Osteoporosis. Chronic back pain and degenerative spine with compression fxs.  On Plavix and ASA. Leukocytosis. Normocytic anemia with Hgb 6.4 in 05/2014 (low iron, ferritin 658).  She takes oral iron for her history of anemia Necrotic PNA in 05/2014, improved but persistent lesion and left pulm nodule on 09/2014 follow up CT chest. . Hiatal hernia per 06/2014 CT.  S/P remote exploratory laparotomy in the 1960s for peritonitis of undetermined cause. 11/2010 Colonoscopy for IDA, FOBT +.  Non-bleeding AVM at hepatic flexure.  11/2010 EGD. Normal.   11/4 office Hgb was 8.3.  PMD sent her to ED for eval.   + fatigue, weakness. Dark stools started 11/6 and they became more burgundy on 10/7.  No nausea. Chronic anorexia.  In addition to the Plavix/ASA/Coumadin, the patient also takes Fosamax, Mobic and twice a week uses a single dose of Aleve.  Not on PPI.  She denies abdominal pain. No nausea or vomiting.  Wears dentures, able to chew her food well and denies dysphagia. Before Sunday, her bowel movements were brown. She has been using Movantic to address hydrocodone-induced constipation.  Repeat Hgb 6.2, 8.8 after PRBC x 2 total.  INR>10, corrected with FFP to 1.5.   WBC 41 K 20 K in 05/2014).  BUN to 44. FOBT +. PPI drip in place.  No PPI PTA  Initial hypotension has improved. No bm today.   She is hungry/thirsty.    Past Medical History  Diagnosis Date  . Subclavian steal syndrome   . Carotid bruit     LICA 01-09% (duplex 3/23)  . Hypertension   . Hyperlipidemia   . Atrial fibrillation ([place])     x 3 yrs  . Meningioma ([place])   . Osteoporosis   . Stroke [facility])     CVA 2008  . Pneumonia May 19, 2014  . Breast cancer ([place])     S/P left mastectomy and chemotherapy 1989 remained in remission  . Anemia 05/2014.  . Carotid artery occlusion   . Ulcers of both lower extremities ([place]) 2015  . Degenerative arthritis of spine 2015    Chronic back pain    Past Surgical History  Procedure Laterality Date  . Cardiac catheterization    . Mastectomy Left 1990  . Cataract extraction Bilateral 2013  . Colonoscopy  11/17/2010    Procedure: COLONOSCOPY;  Surgeon: [name], MD;  Location: [facility] END[facility]ITE;  Service: Endoscopy;  Laterality: N/A;  10:45 am  . Esophagogastroduodenoscopy  11/17/2010    Procedure: ESOPHAGOGASTRODUODENOSCOPY (EGD);  Surgeon: [name], MD;  Location:  [facility] END[facility];  Service: Endoscopy;  Laterality: N/A;  . Lumbar epidural injection  06-2012--06-2013    pt. states she has had 5 epidurals in 06-2012----06-2013  . Eye surgery    . Exploratory laparotomy  1960s    For peritonitis of undetermined cause    Prior to Admission medications   Medication Sig Start Date End Date Taking? Authorizing Provider  albuterol (PROVENTIL HFA;VENTOLIN HFA) 108 (90 BASE) MCG/ACT inhaler Inhale 1-2 puffs into the lungs every 6 (six) hours as needed for wheezing or shortness of breath.   Yes Historical Provider, MD  alendronate (FOSAMAX) 70 MG tablet Take 1 tablet (70 mg total) by mouth every Sunday. Take in the morning with a full glass of water, on empty stomach 11/11/14  Yes Josue Hector, MD  amiodarone (PACERONE) 200 MG tablet Take 1 tablet by mouth  daily 01/18/15  Yes Josue Hector, MD  amLODipine (NORVASC) 10 MG tablet Take 10 mg by mouth every evening.   05/08/14  Yes Historical Provider, MD  aspirin EC 81 MG tablet Take 81 mg by mouth every morning.    Yes Historical Provider, MD  calcium-vitamin D (OSCAL) 250-125 MG-UNIT per tablet Take 1 tablet by mouth 2 (two) times daily.     Yes Historical Provider, MD  clopidogrel (PLAVIX) 75 MG tablet Take 1 tablet by mouth  every morning 01/05/15  Yes Josue Hector, MD  colesevelam [facility]) 625 MG tablet Take 3 tablets by mouth  twice a day with a meal Patient taking differently: Take 1,875 mg by mouth 2 (two) times daily with a meal.  07/16/14  Yes Josue Hector, MD  ferrous sulfate 325 (65 FE) MG tablet Take 325 mg by mouth daily with breakfast.   Yes Historical Provider, MD  HYDROcodone-acetaminophen (NORCO/VICODIN) 5-325 MG per tablet Take 1 tablet by mouth every 6 (six) hours as needed for moderate pain.   Yes Historical Provider, MD  irbesartan (AVAPRO) 300 MG tablet Take 300 mg by mouth every evening.  05/04/14  Yes Historical Provider, MD  Iron-FA-B Cmp-C-Biot-Probiotic (FUSION PLUS) CAPS Take 1 capsule by mouth every morning.    Yes Historical Provider, MD  magnesium oxide (MAG-OX) 400 MG tablet Take 400 mg by mouth every morning.    Yes Historical Provider, MD  meloxicam (MOBIC) 15 MG tablet Take 15 mg by mouth every morning.     Yes Historical Provider, MD  metoprolol (LOPRESSOR) 25 MG tablet Take 75 mg (three) tablets by mouth twice a day Patient taking differently: Take 75 mg by mouth 2 (two) times daily. Take 75 mg (three) tablets by mouth twice a 08/30/14  Yes Josue Hector, MD  Multiple Vitamin (MULTIVITAMIN WITH MINERALS) TABS tablet Take 1 tablet by mouth every morning.   Yes Historical Provider, MD  naloxegol oxalate (MOVANTIK) 25 MG TABS tablet Take 25 mg by mouth daily as needed.   Yes Historical Provider, MD  Omega-3 Fatty Acids (FISH OIL) 1200 MG CAPS Take 1,200 mg by mouth 2 (two) times daily.   Yes Historical Provider, MD  oxyCODONE-acetaminophen (PERCOCET) 7.5-325 MG per tablet Take  0.5 tablets by mouth at bedtime as needed. For pain 04/16/14  Yes Historical Provider, MD  pravastatin (PRAVACHOL) 40 MG tablet Take 1 tablet by mouth  every night at bedtime 09/30/14  Yes Josue Hector, MD  SANTYL ointment Apply 1 application topically daily. Applied to sore on ankle 01/17/15  Yes Historical Provider, MD  vitamin C (ASCORBIC ACID) 500 MG  tablet Take 500 mg by mouth every morning.    Yes Historical Provider, MD  warfarin (COUMADIN) 4 MG tablet Take '4mg'$  daily except '2mg'$  on Sundays and Thursdays Patient taking differently: Take 2 mg by mouth every evening.  08/30/14  Yes Josue Hector, MD    Scheduled Meds: . amiodarone  200 mg Oral Daily  . antiseptic oral rinse  7 mL Mouth Rinse BID  . Chlorhexidine Gluconate Cloth  6 each Topical Q0600  . mupirocin ointment  1 application Nasal BID  . [START ON 02/18/2015] pantoprazole (PROTONIX) IV  40 mg Intravenous Q12H  . piperacillin-tazobactam (ZOSYN)  IV  3.375 g Intravenous Q8H  . sodium chloride  3 mL Intravenous Q12H  . [START ON 02/16/2015] vancomycin  500 mg Intravenous Q24H   Infusions: . sodium chloride 100 mL/hr at 02/14/15 2354  . pantoprozole (PROTONIX) infusion 8 mg/hr (02/15/15 0419)   PRN Meds: albuterol, morphine injection, ondansetron **OR** ondansetron (ZOFRAN) IV   Allergies as of 02/14/2015 - Review Complete 02/14/2015  Allergen Reaction Noted  . Iodine Rash and Other (See Comments) 10/24/2009  . Iohexol Rash and Other (See Comments) 01/07/2007  . Tape Itching and Rash 08/01/2011    Family History  Problem Relation Age of Onset  . Alzheimer's disease Mother   . Dementia Mother   . Hypertension Mother   . Hyperlipidemia Mother   . Parkinsonism Father     Social History   Social History  . Marital Status: Married    Spouse Name: N/A  . Number of Children: 2  . Years of Education: N/A   Occupational History  . Retired    Social History Main Topics  . Smoking status: Former Smoker -- 1.00  packs/day for 56 years    Types: Cigarettes    Quit date: 08/14/2006  . Smokeless tobacco: Never Used     Comment: quit in 2008  . Alcohol Use: 0.0 oz/week    0 Standard drinks or equivalent per week     Comment: Occassional glass of wine  . Drug Use: No  . Sexual Activity: No   Other Topics Concern  . Not on file   Social History Narrative    REVIEW OF SYSTEMS: Constitutional:  Generally weak. Because of back pain should she is unsteady on her feet. Has fallen maybe 3 or 4 times within the last few months. ENT:  No nose bleeds Pulm:  Chronic cough. Although mucoid, it is nonproductive.  No new DOE. Doesn't exert herself enough to really experience significant dyspnea on exertion CV:  No palpitations, no LE edema.  GU:  No hematuria, no frequency GI:  Per HPI Heme:  Per HPi   Transfusions:  Other than the transfusions in sprain 2016, and the transfusions within the last 24 hours, she has never received blood transfusions. Neuro:  No headaches, no peripheral tingling or numbness Derm:  No itching, no rash or sores.  Endocrine:  No sweats or chills.  No polyuria or dysuria Immunization:  Did not inquire. Travel:  None beyond local counties in last few months.    PHYSICAL EXAM: Vital signs in last 24 hours: Filed Vitals:   02/15/15 1010  BP: 108/57  Pulse: 87  Temp: 98.9 F (37.2 C)  Resp: 21   Wt Readings from Last 3 Encounters:  02/15/15 116 lb 2.9 oz (52.7 kg)  01/04/15 118 lb 12.8 oz (53.887 kg)  01/03/15 118 lb 9.6 oz (53.797 kg)   General: Very frail, cachectic appearing  WF. Except for her back pain, she is comfortable. Head:  No facial asymmetry or swelling  Eyes:  No scleral icterus. Conjunctiva pale. Ears:  Not HOH  Nose:  No discharge or congestion Mouth:  Poor dentition, very few teeth remaining. Dentures are not in place. Mucosa is moist and pink. No lesions or exudates Neck:  No JVD, no TMG, no masses. Lungs:  Rhonchi/crackles in the right  base. Heart: RRR. No MRG. S1/S2 audible Abdomen:  Soft, not tender. Not distended. Bowel sounds present. No masses, no fluid wave, no HSM.Marland Kitchen   Rectal: Burgundy/black, FOBT positive stool. No masses   Musc/Skeltl: Kyphosis. No joint erythema or swelling. Extremities:  Gauze bandage covers the right leg just below the knee and the right heel. These were not removed for exam.  Neurologic:  Oriented 3. Moves all 4 limbs. No tremor. No asterixis Skin:  No telangiectasia.  Purpura/heme staining extensively in the hands, forearms Tattoos:  None Nodes:  No cervical adenopathy   Psych:  Pleasant, anxious. Cooperative.  Intake/Output from previous day: 11/07 0701 - 11/08 0700 In: 2568.3 [I.V.:1338.3; Blood:1130; IV Piggyback:100] Out: 200 [Urine:200] Intake/Output this shift:    LAB RESULTS:  Recent Labs  02/14/15 2005 02/15/15 0450  WBC 41.3* 33.1*  HGB 6.2* 8.8*  HCT 18.5* 26.0*  26.4*  PLT 622* 475*   BMET Lab Results  Component Value Date   NA 137 02/15/2015   NA 132* 02/14/2015   NA 133* 06/29/2014   K 4.8 02/15/2015   K 5.3* 02/14/2015   K 4.9 06/29/2014   CL 105 02/15/2015   CL 102 02/14/2015   CL 103 06/29/2014   CO2 22 02/15/2015   CO2 22 02/14/2015   CO2 24 06/29/2014   GLUCOSE 106* 02/15/2015   GLUCOSE 145* 02/14/2015   GLUCOSE 82 06/29/2014   BUN 31* 02/15/2015   BUN 44* 02/14/2015   BUN 21 06/29/2014   CREATININE 0.82 02/15/2015   CREATININE 1.23* 02/14/2015   CREATININE 0.67 06/29/2014   CALCIUM 8.2* 02/15/2015   CALCIUM 8.4* 02/14/2015   CALCIUM 9.1 06/29/2014   LFT  Recent Labs  02/14/15 2005  PROT 6.3*  ALBUMIN 1.8*  AST 20  ALT 16  ALKPHOS 117  BILITOT 0.2*   PT/INR Lab Results  Component Value Date   INR 1.56* 02/15/2015   INR 1.64* 02/15/2015   INR >10.00* 02/14/2015   Hepatitis Panel No results for input(s): HEPBSAG, HCVAB, HEPAIGM, HEPBIGM in the last 72 hours. C-Diff No components found for: CDIFF Lipase     Component  Value Date/Time   LIPASE 13 02/14/2015 2005    Drugs of Abuse  No results found for: LABOPIA, COCAINSCRNUR, LABBENZ, AMPHETMU, THCU, LABBARB   RADIOLOGY STUDIES: Dg Chest Port 1 View  02/14/2015  CLINICAL DATA:  GI bleeding EXAM: PORTABLE CHEST 1 VIEW COMPARISON:  05/20/2014 FINDINGS: There is a dense opacity at the right base with rounded masslike appearance. Findings are similar to abnormality seen on 05/20/2014 chest CT. The left lung is well aerated. Normal heart size and stable aortic and hilar contours given technique. Changes of left mastectomy and axillary dissection. IMPRESSION: Right basilar consolidation with rounded masslike area. Appearance very similar to abnormality seen 05/20/2014, which largely resolved on 09/08/2014 CT. Recurrent pneumonia or inflammatory cavity are primary considerations given rapid recurrence. Recommend chest CT. Electronically Signed   By: Monte Fantasia M.D.   On: 02/14/2015 21:46    ENDOSCOPIC STUDIES: Per HPI  IMPRESSION:    *  GI bleed.  Suspect UGI source given the elevated BUN at initial melenic stool.  Taking Mobic, ASA, aleve.  Suspect med induced ulcer.   Colon AVM, normal EGD in 2012  *  ABL anemia, hx chronic anemia with iron deficiency.   *   Marked leukocytosis, acute on chronic.  No fever.   *  Chronic warfarin, Plavix and ASA.  INR >10, corrected with vit k and FFP to 1.5.    *  Protein malnutrition.   *  Recurrent right PNA.  On abx. Last CT shest was 09/2014.     PLAN:     *  Needs EGD late AM tomorrow.    Azucena Freed  02/15/2015, 11:08 AM Pager: [phone]     Attending Addendum: I have taken an interval history, reviewed the chart, and examined the patient. I agree with the Advanced Practitioner's note and impression. Patient with multiple co-morbidities on coumadin, aspirin, plavix, and also using NSAIDs presenting with anemia and melena, with some red stools as well. INR initially > 10 has since corrected with FFP.  She has responded appropriately to PRBC transfusion and is hemodynamically stable. On PPI at this time with anticoagulants / platelets held. Agree with EGD initially given her history is most suggestive of UGI bleed and BUN elevation. She is scheduled for tomorrow AM to have anesthesia assistance in her case. If she has recurrence of bleeding or becomes unstable in this light please contact us in the interim. Otherwise elevated WBC noted, likely due to pneumonia, recent CT chest reviewed. The indications, risks, and benefits of EGD were explained to the patient in detail. Risks include but are not limited to bleeding, perforation, adverse reaction to medications, and cardiopulmonary compromise. The patient verbalized understanding and wished to proceed. Further recommendations pending results of the exam.    Effort Cellar, MD [facility] Pager [phone]

## 2015-02-16 NOTE — Progress Notes (Signed)
#  1 unit PRBC's started.

## 2015-02-16 NOTE — Consult Note (Signed)
Name: [name] MRN: [id number] DOB: 11-Jan-1941    ADMISSION DATE:  02/14/2015 CONSULTATION DATE:  02/16/2015  REFERRING MD :  Thereasa Solo  CHIEF COMPLAINT:  Cavitary lung lesion  BRIEF PATIENT DESCRIPTION: 74 year old female admitted 11/7 for acute GI bleed and had incidental finding of RLL mass vs cavitary lesion, which has been present in the past and resolved.  This was confirmed on CT. PCCM consulted for further evaluation.  SIGNIFICANT EVENTS:   STUDIES:  CT chest (05/2014) Masslike area of consolidation RLL, r hilar and mediastinal LAN CT chest (06/2014) Significant improvement of RLL opacification, cavitary lesion persists suggestive of resolving PNA with necrosis.  CT chest (09/2014) Near complete collapse of the right lower lobe cavitary lesion with residual pleural-parenchymal thickening. New fine ground-glass nodules in the right middle lobe consistent with pulmonary infection. CT chest (02/15/2015) Recurrent to rounded masslike area of consolidation in the right lower lobe. Given that this area was present previously then resolved and has recurrent quickly, this is most compatible with rounded pneumonia. Small right pleural effusion with adjacent right lower lobe atelectasis or consolidation. Mildly enlarged precarinal lymph node, likely reactive.  HISTORY OF PRESENT ILLNESS:  74 year old female with PMH as below, which includes subclavian steal syndrome, HTN, AF on coumadin, CVA (with significant residual neuromuscular disability), Breast Ca, and Carotid artery occlusion. She has has several episodes of cavitary necrotizing pneumonia first occuring in February of this year (2016). She was treated with 4 days of vanc/cefepime and was then discharged on 10 days augmentin. F/u CT in march showed significant improvement of this. Also was transfused for anemia of chronic illness during that admission. She has been followed by MR in pulmonary office. He saw her in 07/2014 and felt strongly  that the cavitary lesion noted on CT was post-pneumonia in setting of underlying emphysema and had a low probability of being related to cancer. F/u CT 6/16 revelaed resolved PNA to a small scar. And new GGO and small lung nodules with plans for low dose CT in 9 months. 11/4 she went to PCP for bloodwork and hemoglobin came back at 8.3. She was directed to ED when that resulted 11/7. At time of admission she complained of melena and 24 hours of intermittent BRBPR.  INR was noted to be 10 from being on coumadin for AF. WBC 41000. She was admitted to hospitalist team and treated for acute GIB by correcting coagulopathy and PRBC transfusion. With WBC elevation and CXR with cavitary lesion she was started on empiric vancomycin and zosyn. CT of the chest was ordered and showed recurrent to rounded masslike area of consolidation in the right lower lobe, most suspicious for PNA. PCCM asked to see for further evaluation. Currently she complains of no cough, dyspnea, fevers/chills. No chest pain.   PAST MEDICAL HISTORY :   has a past medical history of Subclavian steal syndrome; Carotid bruit; Hypertension; Hyperlipidemia; Atrial fibrillation ([place]); Meningioma ([place]); Osteoporosis; Stroke [facility]); Pneumonia (May 19, 2014); Breast cancer ([place]); Anemia (05/2014.); Carotid artery occlusion; Ulcers of both lower extremities ([place]) (2015); and Degenerative arthritis of spine (2015).  has past surgical history that includes Cardiac catheterization; Mastectomy (Left, 1990); Cataract extraction (Bilateral, 2013); Colonoscopy (11/17/2010); Esophagogastroduodenoscopy (11/17/2010); Lumbar epidural injection (06-2012--06-2013); Eye surgery; and Exploratory laparotomy (1960s). Prior to Admission medications   Medication Sig Start Date End Date Taking? Authorizing Provider  albuterol (PROVENTIL HFA;VENTOLIN HFA) 108 (90 BASE) MCG/ACT inhaler Inhale 1-2 puffs into the lungs every 6 (six) hours  as needed for wheezing or shortness of  breath.   Yes Historical Provider, MD  alendronate (FOSAMAX) 70 MG tablet Take 1 tablet (70 mg total) by mouth every Sunday. Take in the morning with a full glass of water, on empty stomach 11/11/14  Yes Josue Hector, MD  amiodarone (PACERONE) 200 MG tablet Take 1 tablet by mouth  daily 01/18/15  Yes Josue Hector, MD  amLODipine (NORVASC) 10 MG tablet Take 10 mg by mouth every evening.  05/08/14  Yes Historical Provider, MD  aspirin EC 81 MG tablet Take 81 mg by mouth every morning.    Yes Historical Provider, MD  calcium-vitamin D (OSCAL) 250-125 MG-UNIT per tablet Take 1 tablet by mouth 2 (two) times daily.     Yes Historical Provider, MD  clopidogrel (PLAVIX) 75 MG tablet Take 1 tablet by mouth  every morning 01/05/15  Yes Josue Hector, MD  colesevelam [facility]) 625 MG tablet Take 3 tablets by mouth  twice a day with a meal Patient taking differently: Take 1,875 mg by mouth 2 (two) times daily with a meal.  07/16/14  Yes Josue Hector, MD  ferrous sulfate 325 (65 FE) MG tablet Take 325 mg by mouth daily with breakfast.   Yes Historical Provider, MD  HYDROcodone-acetaminophen (NORCO/VICODIN) 5-325 MG per tablet Take 1 tablet by mouth every 6 (six) hours as needed for moderate pain.   Yes Historical Provider, MD  irbesartan (AVAPRO) 300 MG tablet Take 300 mg by mouth every evening.  05/04/14  Yes Historical Provider, MD  Iron-FA-B Cmp-C-Biot-Probiotic (FUSION PLUS) CAPS Take 1 capsule by mouth every morning.    Yes Historical Provider, MD  magnesium oxide (MAG-OX) 400 MG tablet Take 400 mg by mouth every morning.    Yes Historical Provider, MD  meloxicam (MOBIC) 15 MG tablet Take 15 mg by mouth every morning.     Yes Historical Provider, MD  metoprolol (LOPRESSOR) 25 MG tablet Take 75 mg (three) tablets by mouth twice a day Patient taking differently: Take 75 mg by mouth 2 (two) times daily. Take 75 mg (three) tablets by mouth twice a 08/30/14  Yes Josue Hector, MD  Multiple Vitamin (MULTIVITAMIN  WITH MINERALS) TABS tablet Take 1 tablet by mouth every morning.   Yes Historical Provider, MD  naloxegol oxalate (MOVANTIK) 25 MG TABS tablet Take 25 mg by mouth daily as needed.   Yes Historical Provider, MD  Omega-3 Fatty Acids (FISH OIL) 1200 MG CAPS Take 1,200 mg by mouth 2 (two) times daily.   Yes Historical Provider, MD  oxyCODONE-acetaminophen (PERCOCET) 7.5-325 MG per tablet Take 0.5 tablets by mouth at bedtime as needed. For pain 04/16/14  Yes Historical Provider, MD  pravastatin (PRAVACHOL) 40 MG tablet Take 1 tablet by mouth  every night at bedtime 09/30/14  Yes Josue Hector, MD  SANTYL ointment Apply 1 application topically daily. Applied to sore on ankle 01/17/15  Yes Historical Provider, MD  vitamin C (ASCORBIC ACID) 500 MG tablet Take 500 mg by mouth every morning.    Yes Historical Provider, MD  warfarin (COUMADIN) 4 MG tablet Take '4mg'$  daily except '2mg'$  on Sundays and Thursdays Patient taking differently: Take 2 mg by mouth every evening.  08/30/14  Yes Josue Hector, MD   Allergies  Allergen Reactions  . Iodine Rash and Other (See Comments)    BETADINE Rash/burning, blisters on skin.  . Iohexol Rash and Other (See Comments)    Blisters; PT NEEDS 13-HOUR PREP   .  Tape Itching and Rash    Prefers PAPER TAPE    FAMILY HISTORY:  family history includes Alzheimer's disease in her mother; Dementia in her mother; Hyperlipidemia in her mother; Hypertension in her mother; Parkinsonism in her father. SOCIAL HISTORY:  reports that she quit smoking about 8 years ago. Her smoking use included Cigarettes. She has a 56 pack-year smoking history. She has never used smokeless tobacco. She reports that she drinks alcohol. She reports that she does not use illicit drugs.  REVIEW OF SYSTEMS:   Bolds are positive  Constitutional: weight loss, gain, night sweats, Fevers, chills, fatigue .  HEENT: headaches, Sore throat, sneezing, nasal congestion, post nasal drip, Difficulty swallowing,  Tooth/dental problems, visual complaints visual changes, ear ache CV:  chest pain, radiates: ,Orthopnea, PND, swelling in lower extremities, dizziness, palpitations, syncope.  GI  heartburn, indigestion, abdominal pain, nausea, vomiting, diarrhea, change in bowel habits, loss of appetite, bloody stools.  Resp: cough, productive: , hemoptysis, dyspnea, chest pain, pleuritic.  Skin: rash or itching or icterus GU: dysuria, change in color of urine, urgency or frequency. flank pain, hematuria  MS: joint pain or swelling. decreased range of motion  Psych: change in mood or affect. depression or anxiety.  Neuro: difficulty with speech, weakness, numbness, ataxia    SUBJECTIVE:   VITAL SIGNS: Temp:  [97.3 F (36.3 C)-98.5 F (36.9 C)] 97.5 F (36.4 C) (11/09 0826) Pulse Rate:  [80-111] 94 (11/09 1150) Resp:  [14-31] 30 (11/09 1150) BP: (72-129)/(51-90) 106/62 mmHg (11/09 1200) SpO2:  [91 %-100 %] 95 % (11/09 1154)  PHYSICAL EXAMINATION: General:  Elderly female in NAD Neuro:  Alert, oriented HEENT:  [place]/AT, no JVD, PERRL Cardiovascular:  RRR, no MRG Lungs:  RLL crackles otherwise clear Abdomen:  Soft, non-tender, non-distended Musculoskeletal:  Contracted RUE Skin:  Grossly intact   Recent Labs Lab 02/14/15 2005 02/15/15 0450  NA 132* 137  K 5.3* 4.8  CL 102 105  CO2 22 22  BUN 44* 31*  CREATININE 1.23* 0.82  GLUCOSE 145* 106*    Recent Labs Lab 02/14/15 2005 02/15/15 0450 02/15/15 1826 02/16/15 0247 02/16/15 0915  HGB 6.2* 8.8* 7.7* 7.6* 7.5*  HCT 18.5* 26.0*  26.4* 22.9* 22.6* 22.2*  WBC 41.3* 33.1*  --   --   --   PLT 622* 475*  --   --   --    Ct Chest Wo Contrast  02/15/2015  CLINICAL DATA:  Right lower lobe lung mass. EXAM: CT CHEST WITHOUT CONTRAST TECHNIQUE: Multidetector CT imaging of the chest was performed following the standard protocol without IV contrast. COMPARISON:  Multiple chest CTs including 05/20/2014, 07/08/2014, 09/08/2014. FINDINGS: Large  rounded masslike area of consolidation noted in the right lower lobe, measuring 9.5 x 6.7 cm. There is adjacent posterior right lower lobe airspace consolidation and small right pleural effusion. This is in an area of similar rounded opacification in February which resolved in June. I suspect this represents an area of rounded pneumonia. There are small areas of central cavitation. 5 mm nodule in the left lower lobe on image 39 is stable. No confluent opacity on the left. Mildly enlarged precarinal lymph node has a short axis diameter of 16 mm compared with 10 mm previously. No axillary adenopathy. No definite visible hilar adenopathy although evaluation is difficult without intravenous contrast. Chest wall soft tissues are unremarkable aortic and coronary artery calcifications noted. Heart is normal size. Aorta is normal caliber. Imaging into the upper abdomen shows no acute findings. Severe  compression fracture at T12 is stable. New moderate to severe compression fracture at T10. IMPRESSION: Recurrent to rounded masslike area of consolidation in the right lower lobe. Given that this area was present previously then resolved and has recurrent quickly, this is most compatible with rounded pneumonia. Small right pleural effusion with adjacent right lower lobe atelectasis or consolidation. Mildly enlarged precarinal lymph node, likely reactive. Coronary artery and aortic atherosclerosis. Electronically Signed   By: Rolm Baptise M.D.   On: 02/15/2015 16:31   Dg Chest Port 1 View  02/14/2015  CLINICAL DATA:  GI bleeding EXAM: PORTABLE CHEST 1 VIEW COMPARISON:  05/20/2014 FINDINGS: There is a dense opacity at the right base with rounded masslike appearance. Findings are similar to abnormality seen on 05/20/2014 chest CT. The left lung is well aerated. Normal heart size and stable aortic and hilar contours given technique. Changes of left mastectomy and axillary dissection. IMPRESSION: Right basilar consolidation with  rounded masslike area. Appearance very similar to abnormality seen 05/20/2014, which largely resolved on 09/08/2014 CT. Recurrent pneumonia or inflammatory cavity are primary considerations given rapid recurrence. Recommend chest CT. Electronically Signed   By: Monte Fantasia M.D.   On: 02/14/2015 21:46   Assessment and Plan/Attending Note:  74 year old female with history of CVAs who left her severely debilitated, presenting to the hospital with upper GI bleeding. Abdominal CT was ordered and patient was found to have a recurrence of RLL infiltrate. PCCM was consulted. The chart was reviewed thoroughly and patient has stated to Dr. Chase Caller that she is not interested in diagnosis or treatment of cancer but that was a concern at one point. I reviewed the CT myself, a very dense pneumonia is noted. Interestingly enough in the same location. DDx is aspiration pneumonia (specially in a very highly debilitated woman) vs post obstructive pneumonia (unlikely given the fact that the CT cleared except for a cavity after treatment). This is no at all likely to be TB given location and pre-test probability in the patient's demographics.   Pneumonia: as above aspiration vs post obstructive. - Vanc/zosyn as ordered. - F/U on cultures. - No bronchoscopy. - F/U CT in 8-12 wks for clearance. - F/u on that with Dr. Chase Caller.  Cavitary lesion: this is not TB. - Quant-feron gold sent. - F/U on results. - Continue droplet precautions for now. - May d/c if negative.  Recurrence of PNA: - Swallow evaluation. - F/U imaging as above. - No bronchoscopy.  Cancer risk: is high but patient does not wish for any treatment if it is cancer. - No bronchoscopy. - F/U imaging as above.  Discussed with TRH-MD and PCCM-NP.  Patient seen and  examined, agree with above note. I dictated the care and orders written for this patient under my direction.  Rush Farmer, MD [phone]  Pulmonary and [place] Pager: [phone]  02/16/2015, 1:41 PM

## 2015-02-16 NOTE — Progress Notes (Signed)
Lt  F.A. Iv patient with lt . Mastectomy. Ok to use extremity per Dr. Thereasa Solo

## 2015-02-17 ENCOUNTER — Encounter (HOSPITAL_COMMUNITY): Payer: Self-pay | Admitting: Gastroenterology

## 2015-02-17 DIAGNOSIS — D689 Coagulation defect, unspecified: Secondary | ICD-10-CM | POA: Diagnosis present

## 2015-02-17 DIAGNOSIS — K259 Gastric ulcer, unspecified as acute or chronic, without hemorrhage or perforation: Secondary | ICD-10-CM | POA: Diagnosis present

## 2015-02-17 DIAGNOSIS — K922 Gastrointestinal hemorrhage, unspecified: Secondary | ICD-10-CM | POA: Diagnosis present

## 2015-02-17 DIAGNOSIS — C3490 Malignant neoplasm of unspecified part of unspecified bronchus or lung: Secondary | ICD-10-CM

## 2015-02-17 DIAGNOSIS — E785 Hyperlipidemia, unspecified: Secondary | ICD-10-CM

## 2015-02-17 LAB — QUANTIFERON IN TUBE
QFT TB AG MINUS NIL VALUE: 0 [IU]/mL
QUANTIFERON MITOGEN VALUE: 0.08 [IU]/mL
QUANTIFERON NIL VALUE: 0.02 [IU]/mL
QUANTIFERON TB AG VALUE: 0.02 IU/mL
QUANTIFERON TB GOLD: UNDETERMINED

## 2015-02-17 LAB — CBC
HCT: 32.9 % — ABNORMAL LOW (ref 36.0–46.0)
HCT: 31.6 % — ABNORMAL LOW (ref 36.0–46.0)
HEMOGLOBIN: 11.1 g/dL — AB (ref 12.0–15.0)
Hemoglobin: 10.5 g/dL — ABNORMAL LOW (ref 12.0–15.0)
MCH: 29.8 pg (ref 26.0–34.0)
MCH: 29.2 pg (ref 26.0–34.0)
MCHC: 33.7 g/dL (ref 30.0–36.0)
MCHC: 33.2 g/dL (ref 30.0–36.0)
MCV: 88.2 fL (ref 78.0–100.0)
MCV: 88 fL (ref 78.0–100.0)
PLATELETS: 451 10*3/uL — AB (ref 150–400)
Platelets: 467 10*3/uL — ABNORMAL HIGH (ref 150–400)
RBC: 3.73 MIL/uL — AB (ref 3.87–5.11)
RBC: 3.59 MIL/uL — ABNORMAL LOW (ref 3.87–5.11)
RDW: 14.8 % (ref 11.5–15.5)
RDW: 15.1 % (ref 11.5–15.5)
WBC: 25.6 10*3/uL — ABNORMAL HIGH (ref 4.0–10.5)
WBC: 22.1 10*3/uL — ABNORMAL HIGH (ref 4.0–10.5)

## 2015-02-17 LAB — COMPREHENSIVE METABOLIC PANEL
ALK PHOS: 102 U/L (ref 38–126)
ALT: 17 U/L (ref 14–54)
AST: 20 U/L (ref 15–41)
Albumin: 1.6 g/dL — ABNORMAL LOW (ref 3.5–5.0)
Anion gap: 7 (ref 5–15)
BUN: 7 mg/dL (ref 6–20)
CALCIUM: 7.5 mg/dL — AB (ref 8.9–10.3)
CHLORIDE: 106 mmol/L (ref 101–111)
CO2: 21 mmol/L — AB (ref 22–32)
CREATININE: 0.59 mg/dL (ref 0.44–1.00)
GFR calc Af Amer: >60 mL/min (ref >60)
Glucose, Bld: 83 mg/dL (ref 65–99)
Potassium: 3.4 mmol/L — ABNORMAL LOW (ref 3.5–5.1)
SODIUM: 134 mmol/L — AB (ref 135–145)
Total Bilirubin: 0.9 mg/dL (ref 0.3–1.2)
Total Protein: 5.4 g/dL — ABNORMAL LOW (ref 6.5–8.1)

## 2015-02-17 LAB — TYPE AND SCREEN
ABO/RH(D): A POS
Antibody Screen: NEGATIVE
UNIT DIVISION: 0
Unit division: 0

## 2015-02-17 LAB — PROTIME-INR
INR: 1.44 (ref 0.00–1.49)
Prothrombin Time: 17.7 seconds — ABNORMAL HIGH (ref 11.6–15.2)

## 2015-02-17 LAB — RESPIRATORY VIRUS PANEL
Adenovirus: NEGATIVE
INFLUENZA A: NEGATIVE
INFLUENZA B 1: NEGATIVE
METAPNEUMOVIRUS: NEGATIVE
PARAINFLUENZA 1 A: NEGATIVE
PARAINFLUENZA 3 A: NEGATIVE
Parainfluenza 2: NEGATIVE
Respiratory Syncytial Virus A: NEGATIVE
Respiratory Syncytial Virus B: NEGATIVE
Rhinovirus: NEGATIVE

## 2015-02-17 LAB — APTT: aPTT: 34 seconds (ref 24–37)

## 2015-02-17 LAB — QUANTIFERON TB GOLD ASSAY (BLOOD)

## 2015-02-17 MED ORDER — SODIUM CHLORIDE 3 % IN NEBU
3.0000 mL | INHALATION_SOLUTION | Freq: Once | RESPIRATORY_TRACT | Status: AC | PRN
  Administered 2015-02-17: 3 mL via RESPIRATORY_TRACT
  Filled 2015-02-17: qty 4

## 2015-02-17 NOTE — Progress Notes (Signed)
Name: [name] MRN: [id number] DOB: 09-Apr-1941    ADMISSION DATE:  02/14/2015 CONSULTATION DATE:  02/16/2015  REFERRING MD :  Thereasa Solo  CHIEF COMPLAINT:  Cavitary lung lesion  BRIEF PATIENT DESCRIPTION: 74 year old female admitted 11/7 for acute GI bleed and had incidental finding of RLL mass vs cavitary lesion, which has been present in the past and resolved.  This was confirmed on CT. PCCM consulted for further evaluation.  SIGNIFICANT EVENTS:   STUDIES:  CT chest (05/2014) Masslike area of consolidation RLL, r hilar and mediastinal LAN CT chest (06/2014) Significant improvement of RLL opacification, cavitary lesion persists suggestive of resolving PNA with necrosis.  CT chest (09/2014) Near complete collapse of the right lower lobe cavitary lesion with residual pleural-parenchymal thickening. New fine ground-glass nodules in the right middle lobe consistent with pulmonary infection. CT chest (02/15/2015) Recurrent to rounded masslike area of consolidation in the right lower lobe. Given that this area was present previously then resolved and has recurrent quickly, this is most compatible with rounded pneumonia. Small right pleural effusion with adjacent right lower lobe atelectasis or consolidation. Mildly enlarged precarinal lymph node, likely reactive.  HISTORY OF PRESENT ILLNESS:  74 year old female with PMH as below, which includes subclavian steal syndrome, HTN, AF on coumadin, CVA (with significant residual neuromuscular disability), Breast Ca, and Carotid artery occlusion. She has has several episodes of cavitary necrotizing pneumonia first occuring in February of this year (2016). She was treated with 4 days of vanc/cefepime and was then discharged on 10 days augmentin. F/u CT in march showed significant improvement of this. Also was transfused for anemia of chronic illness during that admission. She has been followed by MR in pulmonary office. He saw her in 07/2014 and felt strongly  that the cavitary lesion noted on CT was post-pneumonia in setting of underlying emphysema and had a low probability of being related to cancer. F/u CT 6/16 revelaed resolved PNA to a small scar. And new GGO and small lung nodules with plans for low dose CT in 9 months. 11/4 she went to PCP for bloodwork and hemoglobin came back at 8.3. She was directed to ED when that resulted 11/7. At time of admission she complained of melena and 24 hours of intermittent BRBPR.  INR was noted to be 10 from being on coumadin for AF. WBC 41000. She was admitted to hospitalist team and treated for acute GIB by correcting coagulopathy and PRBC transfusion. With WBC elevation and CXR with cavitary lesion she was started on empiric vancomycin and zosyn. CT of the chest was ordered and showed recurrent to rounded masslike area of consolidation in the right lower lobe, most suspicious for PNA. PCCM asked to see for further evaluation. Currently she complains of no cough, dyspnea, fevers/chills. No chest pain.   SUBJECTIVE: No events overnight.  VITAL SIGNS: Temp:  [96.4 F (35.8 C)-98.8 F (37.1 C)] 98.4 F (36.9 C) (11/10 1211) Pulse Rate:  [77-97] 92 (11/10 1211) Resp:  [18-40] 22 (11/10 1211) BP: (100-128)/(59-92) 128/73 mmHg (11/10 1211) SpO2:  [87 %-94 %] 92 % (11/10 1211)  PHYSICAL EXAMINATION: General:  Elderly female in NAD Neuro:  Alert, oriented HEENT:  [place]/AT, no JVD, PERRL Cardiovascular:  RRR, no MRG Lungs:  RLL crackles otherwise clear Abdomen:  Soft, non-tender, non-distended Musculoskeletal:  Contracted RUE Skin:  Grossly intact   Recent Labs Lab 02/14/15 2005 02/15/15 0450 02/17/15 0318  NA 132* 137 134*  K 5.3* 4.8 3.4*  CL 102  105 106  CO2 22 22 21*  BUN 44* 31* 7  CREATININE 1.23* 0.82 0.59  GLUCOSE 145* 106* 83    Recent Labs Lab 02/15/15 0450  02/16/15 0915 02/16/15 2300 02/17/15 0318  HGB 8.8*  < > 7.5* 11.1* 10.5*  HCT 26.0*  26.4*  < > 22.2* 32.9* 31.6*  WBC 33.1*   --   --  25.6* 22.1*  PLT 475*  --   --  467* 451*  < > = values in this interval not displayed. Ct Chest Wo Contrast  02/15/2015  CLINICAL DATA:  Right lower lobe lung mass. EXAM: CT CHEST WITHOUT CONTRAST TECHNIQUE: Multidetector CT imaging of the chest was performed following the standard protocol without IV contrast. COMPARISON:  Multiple chest CTs including 05/20/2014, 07/08/2014, 09/08/2014. FINDINGS: Large rounded masslike area of consolidation noted in the right lower lobe, measuring 9.5 x 6.7 cm. There is adjacent posterior right lower lobe airspace consolidation and small right pleural effusion. This is in an area of similar rounded opacification in February which resolved in June. I suspect this represents an area of rounded pneumonia. There are small areas of central cavitation. 5 mm nodule in the left lower lobe on image 39 is stable. No confluent opacity on the left. Mildly enlarged precarinal lymph node has a short axis diameter of 16 mm compared with 10 mm previously. No axillary adenopathy. No definite visible hilar adenopathy although evaluation is difficult without intravenous contrast. Chest wall soft tissues are unremarkable aortic and coronary artery calcifications noted. Heart is normal size. Aorta is normal caliber. Imaging into the upper abdomen shows no acute findings. Severe compression fracture at T12 is stable. New moderate to severe compression fracture at T10. IMPRESSION: Recurrent to rounded masslike area of consolidation in the right lower lobe. Given that this area was present previously then resolved and has recurrent quickly, this is most compatible with rounded pneumonia. Small right pleural effusion with adjacent right lower lobe atelectasis or consolidation. Mildly enlarged precarinal lymph node, likely reactive. Coronary artery and aortic atherosclerosis. Electronically Signed   By: Rolm Baptise M.D.   On: 02/15/2015 16:31   Assessment and Plan/Attending Note:  74 year  old female with history of CVAs who left her severely debilitated, presenting to the hospital with upper GI bleeding. Abdominal CT was ordered and patient was found to have a recurrence of RLL infiltrate. PCCM was consulted. The chart was reviewed thoroughly and patient has stated to Dr. Chase Caller that she is not interested in diagnosis or treatment of cancer but that was a concern at one point. I reviewed the CT myself, a very dense pneumonia is noted. Interestingly enough in the same location. DDx is aspiration pneumonia (specially in a very highly debilitated woman) vs post obstructive pneumonia (unlikely given the fact that the CT cleared except for a cavity after treatment). This is no at all likely to be TB given location and pre-test probability in the patient's demographics.  More importantly however, the patient does not wish for any testing.   Pneumonia: as above aspiration vs post obstructive. - Vanc/zosyn as ordered. - F/U on cultures. - No bronchoscopy indicated and per patient's request. - F/U CT in 8-12 wks for clearance if patient wishes to continue screening for cancer. - F/u on that with Dr. Chase Caller.  Cavitary lesion: this is not TB. - Quant-feron gold sent and pending. - F/U on results. - Continue droplet precautions for now. - May d/c if negative.  Recurrence of PNA: -  Swallow evaluation passed. - F/U imaging as above. - No bronchoscopy.  Cancer risk: is high but patient does not wish for any treatment if it is cancer. - No bronchoscopy. - F/U imaging as above.  - Patient is refusing interventions at this time.  Once quantiferon gold has resulted negative, recommend completion of treatment course as HCAP then repeat CT in 8-12 weeks if patient wants any further follow up for work up for malignancy  and f/u with Dr. Chase Caller, if not then would recommend no further follow up.  PCCM will sign off, please call back if needed.  Discussed with TRH-MD and PCCM-NP.  Rush Farmer, M.D. [facility] Pulmonary/Critical Care Medicine. Pager: [phone]. After hours pager: [phone].  02/17/2015, 1:15 PM

## 2015-02-17 NOTE — Progress Notes (Signed)
Sputum induction performed with no success of sputum sample at this time. Duo neb given post hypertonic solution per MD order. RN notified.

## 2015-02-17 NOTE — Progress Notes (Signed)
[facility] TEAM 1 - Stepdown/ICU TEAM Progress Note  [name] RXV:[id number] DOB: 12-Jun-1940 DOA: 02/14/2015 PCP: Wende Neighbors, MD  Admit HPI / Brief Narrative: [name] is a 74 y.o.WF PMHx HTN, CAD native artery, Atrial Fibrillation on warfarin, Subclavian Steal Syndrome, Carotid Bruit, Carotid Artery Occlusion, Breast Cancer S/P left mastectomy Meningioma, HLD, chronic right ankle venous stasis ulcer  Presents with complaints of hematochezia, patient reports she had recent blood work done by PCP office on November 4, hemoglobin came back at 8.3, she was told to come to the ED for further evaluation for anemia, patient reports she's been having generalized weakness and fatigue over the last few days, dark-colored stools for a few days as well, she developed bright red blood per rectum over the last 24 hours which prompted her to come to the ED, patient is on aspirin and Plavix for carotid artery disease, as well on low back for lower back pain, and she is on warfarin for A. Fib,her INR is >10, Hgb is 6.2, WBC is 41000, denies fever, chills or cough or dysuria, his x-ray reveals recurrence of right lung lesion, husband report she had colonoscopy and endoscopy in 2012 without acute findings, hospitalist requested to admit.   HPI/Subjective: 11/10 A/O 4, positive dry cough.  Assessment/Plan: Acute GI bleed - Multifactorial to include upper/lower GI bleed, iatrogenic patient on aspirin, Plavix, warfarin -11/7 transfused 1 unit PRBC -11/7 transfused 2 units FFP -11/8 transfused 2 units FFP -EGD; no significant areas of bleeding, most likely secondary to supratherapeutic INR -H/H stable  Atrial fibrillation - Currently normal sinus rhythm, continue with amiodarone, hold metoprolol giving hypotension, warfarin on hold given GI bleed.  Essential HTN   -Improving, still borderline hypotensive for her age. Continue to hold all medication   CAD native artery - Continue to hold aspirin and  Plavix  Supratherapeutic INR/coagulopathy - Patient has been given vitamin K and FFP, continue to hold warfarin . -INR now WNL, would continue to hold anticoagulant for at least 2 weeks -In the a.m. discuss risks and benefits of restarting aspirin vs aspirin+ Plavix prior to discharge  Right lung mass/cavitary lesion -QuantiFERON blood test; indeterminate -Obtain induced sputum for culture -Influenza panel/respiratory virus panel; negative -Obtain chest CT; see results below -PCCM has evaluated patient and patient does not want to pursue invasive diagnostic procedures of RLL cavitary lesion/mass/infection  Acute renal failure - Resolved with hydration  -Avoid nephrotoxic medication.  SIRS/acute - Patient with leukocytosis and hypotension, this is most likely related to GI bleed, follow on septic workup .  Leukocytosis - Most likely reactive especially patient is afebrile, but patient has chronically elevated white blood cells, will need evaluation by hematology when she is more stable, but at this point will obtain urinalysis, blood cultures. - Patient has cavitary right lung lesion, continue empirically on vancomycin and Zosyn and complete 7-10 days antibiotics for HCAP  Thrombocytosis  - Chronic, at baseline .  Chronic lower back pain - Discontinue Mobitz, continue with low-dose morphine as needed if blood pressure allows -Per patient and husband was supposed to been evaluated by MRI, if time allows obtain MRI; NOTE patient very claustrophobic  Right ankle venous stasis ulcer -Husband states emphatically she does not want debriding. Has been using Santyl and dressing changes.  -Consult wound care  Severe protein calorie malnutrition - To be addressed when patient is more stable.     Code Status: FULL Family Communication: Husband present at time of exam Disposition Plan: Per  GI    Consultants: GI  Procedure/Significant Events: 11/7 transfused one unit  PRBC 11/7 transfused 2 units FFP 11/8 transfused 2 units FFP 11/9 EGD;Normal esophagus -5cm hiatal hernia -Gastritis with numerous clean based linear ulcers of the stomach, w/ a few clean based ulcers in the duodenal bulb    Culture 11/7 right AC/wrist pending 11/7 MRSA by PCR positive 11/8 QuantiFERON indeterminate 11/8 influenza A/B/H1N1 negative 11/8 respiratory virus panel negative    Antibiotics: Zosyn 11/7>> Vancomycin 11/7>>  DVT prophylaxis: SCD   Devices    LINES / TUBES:      Continuous Infusions: . sodium chloride 75 mL/hr at 02/16/15 2222  . sodium chloride      Objective: VITAL SIGNS: Temp: 97.8 F (36.6 C) (11/10 1930) Temp Source: Oral (11/10 1930) BP: 122/65 mmHg (11/10 1930) Pulse Rate: 98 (11/10 1930) SPO2; FIO2:   Intake/Output Summary (Last 24 hours) at 02/17/15 2105 Last data filed at 02/17/15 2031  Gross per 24 hour  Intake   2975 ml  Output   2350 ml  Net    625 ml     Exam: General: A/O 4, positive acute respiratory distress Eyes: Negative headache, eye pain, double vision,negative scleral hemorrhage ENT: Negative Runny nose, negative ear pain, negative gingival bleeding, Neck:  Negative scars, masses, torticollis, lymphadenopathy, JVD Lungs: diffuse poor air movement in all lung fields, inspiratory and expiratory wheezing right lung fields, decreased breath sounds right lung base, negative crackles  Cardiovascular: Regular rate and rhythm without murmur gallop or rub normal S1 and S2 Abdomen:negative abdominal pain, nondistended, positive soft, bowel sounds, no rebound, no ascites, no appreciable mass Extremities: No significant cyanosis, clubbing, or edema bilateral lower extremities, right ankle venous stasis ulcer? Psychiatric:  Negative depression, negative anxiety, negative fatigue, negative mania  Neurologic:  Cranial nerves II through XII intact, tongue/uvula midline, all extremities muscle strength 5/5, sensation  intact throughout, negative dysarthria, negative expressive aphasia, negative receptive aphasia.   Data Reviewed: Basic Metabolic Panel:  Recent Labs Lab 02/14/15 2005 02/15/15 0450 02/17/15 0318  NA 132* 137 134*  K 5.3* 4.8 3.4*  CL 102 105 106  CO2 22 22 21*  GLUCOSE 145* 106* 83  BUN 44* 31* 7  CREATININE 1.23* 0.82 0.59  CALCIUM 8.4* 8.2* 7.5*   Liver Function Tests:  Recent Labs Lab 02/14/15 2005 02/17/15 0318  AST 20 20  ALT 16 17  ALKPHOS 117 102  BILITOT 0.2* 0.9  PROT 6.3* 5.4*  ALBUMIN 1.8* 1.6*    Recent Labs Lab 02/14/15 2005  LIPASE 13   No results for input(s): AMMONIA in the last 168 hours. CBC:  Recent Labs Lab 02/14/15 2005 02/15/15 0450 02/15/15 1826 02/16/15 0247 02/16/15 0915 02/16/15 2300 02/17/15 0318  WBC 41.3* 33.1*  --   --   --  25.6* 22.1*  NEUTROABS 36.5*  --   --   --   --   --   --   HGB 6.2* 8.8* 7.7* 7.6* 7.5* 11.1* 10.5*  HCT 18.5* 26.0*  26.4* 22.9* 22.6* 22.2* 32.9* 31.6*  MCV 89.8 88.4  --   --   --  88.2 88.0  PLT 622* 475*  --   --   --  467* 451*   Cardiac Enzymes: No results for input(s): CKTOTAL, CKMB, CKMBINDEX, TROPONINI in the last 168 hours. BNP (last 3 results) No results for input(s): BNP in the last 8760 hours.  ProBNP (last 3 results) No results for input(s): PROBNP in the last  8760 hours.  CBG: No results for input(s): GLUCAP in the last 168 hours.  Recent Results (from the past 240 hour(s))  Culture, blood (routine x 2)     Status: None (Preliminary result)   Collection Time: 02/14/15 10:35 PM  Result Value Ref Range Status   Specimen Description RIGHT ANTECUBITAL  Final   Special Requests BOTTLES DRAWN AEROBIC AND ANAEROBIC 6CC  Final   Culture PENDING  Incomplete   Report Status PENDING  Incomplete  Culture, blood (routine x 2)     Status: None (Preliminary result)   Collection Time: 02/14/15 10:40 PM  Result Value Ref Range Status   Specimen Description BLOOD RIGHT WRIST  Final    Special Requests BOTTLES DRAWN AEROBIC ONLY [place]  Final   Culture PENDING  Incomplete   Report Status PENDING  Incomplete  MRSA PCR Screening     Status: Abnormal   Collection Time: 02/15/15  4:15 AM  Result Value Ref Range Status   MRSA by PCR POSITIVE (A) NEGATIVE Corrected    Comment:        The GeneXpert MRSA Assay (FDA approved for NASAL specimens only), is one component of a comprehensive MRSA colonization surveillance program. It is not intended to diagnose MRSA infection nor to guide or monitor treatment for MRSA infections. RESULT CALLED TO, READ BACK BY AND VERIFIED WITH: A PETTIFORD @ 4196 02/08/15 MKELLY        The GeneXpert MRSA Assay (FDA approved for NASAL specimens only), is one component of a comprehensive MRSA colonization surveillance program. It is not intended to diagnose MRSA infection nor to guide or monitor treatment for MRSA infections. CORRECTED ON 11/08 AT 2229: PREVIOUSLY REPORTED AS RESISTANT        The GeneXpert MRSA Assay (FDA approved for NASAL specimens only), is one component of a comprehensive MRSA colonization surveillance program. It is not intended to diagnose MRSA  infection nor to guide or monitor treatment for MRSA infections. RESULT CALLED TO, READ BACK BY AND VERIFIED WITH: A PETTIFORD @ 7989 02/08/15 [place] ELLY   Respiratory virus panel     Status: None   Collection Time: 02/15/15  2:49 PM  Result Value Ref Range Status   Respiratory Syncytial Virus A Negative Negative Final   Respiratory Syncytial Virus B Negative Negative Final   Influenza A Negative Negative Final   Influenza B Negative Negative Final   Parainfluenza 1 Negative Negative Final   Parainfluenza 2 Negative Negative Final   Parainfluenza 3 Negative Negative Final   Metapneumovirus Negative Negative Final   Rhinovirus Negative Negative Final   Adenovirus Negative Negative Final    Comment: (NOTE) Performed At: [facility] [place], [place]  [id number] Lindon Romp MD CX:[id number]      Studies:  Recent x-ray studies have been reviewed in detail by the Attending Physician  Scheduled Meds:  Scheduled Meds: . sodium chloride   Intravenous Once  . amiodarone  200 mg Oral Daily  . antiseptic oral rinse  7 mL Mouth Rinse BID  . Chlorhexidine Gluconate Cloth  6 each Topical Q0600  . collagenase   Topical Daily  . mupirocin ointment  1 application Nasal BID  . pantoprazole  40 mg Oral BID  . piperacillin-tazobactam (ZOSYN)  IV  3.375 g Intravenous Q8H  . sodium chloride  3 mL Intravenous Q12H  . sodium chloride HYPERTONIC  15 mL Nebulization Once  . vancomycin  500 mg Intravenous Q12H    Time spent on  care of this patient: 40 mins   Chaquita Basques, Geraldo Docker , MD  [facility] Office  [phone] Pager [phone]  On-Call/Text Page:      [email or web address]      password [email or web address]  If 7PM-7AM, please contact night-coverage [email or web address] Password [email or web address] 02/17/2015, 9:05 PM   LOS: 3 days   Care during the described time interval was provided by me .  I have reviewed this patient's available data, including medical history, events of note, physical examination, and all test results as part of my evaluation. I have personally reviewed and interpreted all radiology studies.   Dia Crawford, MD [phone] Pager

## 2015-02-17 NOTE — Progress Notes (Signed)
[facility] Progress Note  Subjective:   Hgb 10.5.  INR 1.44. S/P EGD 02/16/15: ENDOSCOPIC IMPRESSION: Normal esophagus 5cm hiatal hernia Gastritis with numerous clean based linear ulcers of the stomach, with a few clean based ulcers in the duodenal bulb Page 1 of 2 Overall, suspect the patient had bleeding from these findings in the setting of supratherapeutic INR and plavix/aspirin use. Unclear if ulcerations are due to aspirin / NSAIDs vs. H pylori RECOMMENDATIONS: Continue IV protonix, can switch to '40mg'$  twice daily Clear liquid diet okay Trend H/H, coags Avoid NSAIDs if possible H pylori IgG serology - treat if positive Please call with questions or changes in her status.  Had bedside swallowing study this morning--recommended to continue thin liquids.H Pylori serology pending.Feels well this morning. No N/V or abd pain. No diarrhea/dark stools. Objective:  Vital signs in last 24 hours: Temp:  [96.4 F (35.8 C)-98.8 F (37.1 C)] 97.5 F (36.4 C) (11/10 0700) Pulse Rate:  [77-102] 92 (11/10 0800) Resp:  [18-40] 25 (11/10 0800) BP: (99-125)/(55-92) 125/73 mmHg (11/10 0800) SpO2:  [87 %-99 %] 92 % (11/10 0800) Last BM Date: 02/13/15 General:   Alert, frail female in NAD Heart:  Regular rate and rhythm; no murmurs Pulm;lungs clear Abdomen:  Soft, nontender and nondistended. Normal bowel sounds, without guarding, and without rebound.   Extremities:  Without edema.   Intake/Output from previous day: 11/09 0701 - 11/10 0700 In: 2035 [P.O.:240; I.V.:675; Blood:970; IV Piggyback:150] Out: 1500 [Urine:1500] Intake/Output this shift:    Lab Results:  Recent Labs  02/15/15 0450  02/16/15 0915 02/16/15 2300 02/17/15 0318  WBC 33.1*  --   --  25.6* 22.1*  HGB 8.8*  < > 7.5* 11.1* 10.5*  HCT 26.0*  26.4*  < > 22.2* 32.9* 31.6*  PLT 475*  --   --  467* 451*  < > = values in this interval not displayed. BMET  Recent Labs  02/14/15 2005  02/15/15 0450 02/17/15 0318  NA 132* 137 134*  K 5.3* 4.8 3.4*  CL 102 105 106  CO2 22 22 21*  GLUCOSE 145* 106* 83  BUN 44* 31* 7  CREATININE 1.23* 0.82 0.59  CALCIUM 8.4* 8.2* 7.5*   LFT  Recent Labs  02/17/15 0318  PROT 5.4*  ALBUMIN 1.6*  AST 20  ALT 17  ALKPHOS 102  BILITOT 0.9   PT/INR  Recent Labs  02/16/15 1025 02/17/15 0318  LABPROT 17.5* 17.7*  INR 1.43 1.44   Hepatitis Panel No results for input(s): HEPBSAG, HCVAB, HEPAIGM, HEPBIGM in the last 72 hours.  Ct Chest Wo Contrast  02/15/2015  CLINICAL DATA:  Right lower lobe lung mass. EXAM: CT CHEST WITHOUT CONTRAST TECHNIQUE: Multidetector CT imaging of the chest was performed following the standard protocol without IV contrast. COMPARISON:  Multiple chest CTs including 05/20/2014, 07/08/2014, 09/08/2014. FINDINGS: Large rounded masslike area of consolidation noted in the right lower lobe, measuring 9.5 x 6.7 cm. There is adjacent posterior right lower lobe airspace consolidation and small right pleural effusion. This is in an area of similar rounded opacification in February which resolved in June. I suspect this represents an area of rounded pneumonia. There are small areas of central cavitation. 5 mm nodule in the left lower lobe on image 39 is stable. No confluent opacity on the left. Mildly enlarged precarinal lymph node has a short axis diameter of 16 mm compared with 10 mm previously. No axillary adenopathy. No definite visible hilar adenopathy although  evaluation is difficult without intravenous contrast. Chest wall soft tissues are unremarkable aortic and coronary artery calcifications noted. Heart is normal size. Aorta is normal caliber. Imaging into the upper abdomen shows no acute findings. Severe compression fracture at T12 is stable. New moderate to severe compression fracture at T10. IMPRESSION: Recurrent to rounded masslike area of consolidation in the right lower lobe. Given that this area was present  previously then resolved and has recurrent quickly, this is most compatible with rounded pneumonia. Small right pleural effusion with adjacent right lower lobe atelectasis or consolidation. Mildly enlarged precarinal lymph node, likely reactive. Coronary artery and aortic atherosclerosis. Electronically Signed   By: Rolm Baptise M.D.   On: 02/15/2015 16:31    ASSESSMENT/PLAN:  74 yo female admitted with UGI--was taking mobic, ASA and aleve. EGD yesterday with gastritis and numerous clean based ulcers in stomach.Continue bid PPI. Full liquids today.     LOS: 3 days   Hvozdovic, Vita Barley PA-C 02/17/2015, Pager [phone] Mon-Fri 8a-5p [phone] after 5p, weekends, holidays   Attending physician's note   I have taken an interval history, reviewed the chart and examined the patient. I agree with the Advanced Practitioner's note, impression and recommendations.  Multiple clean based gastric ulcers in the setting of NSAID's Advance diet as tolerated. Will sign off, please call with any questions  K Denzil Magnuson, MD [phone] Mon-Fri 8a-5p [phone] after 5p, weekends, holidays

## 2015-02-17 NOTE — Evaluation (Addendum)
Clinical/Bedside Swallow Evaluation Patient Details  Name: [name] MRN: [id number] Date of Birth: 1941/02/19  Today's Date: 02/17/2015 Time: SLP Start Time (ACUTE ONLY): 0911 SLP Stop Time (ACUTE ONLY): 0929 SLP Time Calculation (min) (ACUTE ONLY): 18 min  Past Medical History:  Past Medical History  Diagnosis Date  . Subclavian steal syndrome   . Carotid bruit     LICA 29-47% (duplex 6/54)  . Hypertension   . Hyperlipidemia   . Atrial fibrillation ([place])     x 3 yrs  . Meningioma ([place])   . Osteoporosis   . Stroke [facility]     CVA 2008  . Pneumonia May 19, 2014  . Breast cancer ([place])     S/P left mastectomy and chemotherapy 1989 remained in remission  . Anemia 05/2014.  . Carotid artery occlusion   . Ulcers of both lower extremities ([place]) 2015  . Degenerative arthritis of spine 2015    Chronic back pain   Past Surgical History:  Past Surgical History  Procedure Laterality Date  . Cardiac catheterization    . Mastectomy Left 1990  . Cataract extraction Bilateral 2013  . Colonoscopy  11/17/2010    Procedure: COLONOSCOPY;  Surgeon: [name], MD;  [facility];  Service: Endoscopy;  Laterality: N/A;  10:45 am  . Esophagogastroduodenoscopy  11/17/2010    Procedure: ESOPHAGOGASTRODUODENOSCOPY (EGD);  Surgeon: [name], MD;  Location: [facility];  Service: Endoscopy;  Laterality: N/A;  . Lumbar epidural injection  06-2012--06-2013    pt. states she has had 5 epidurals in 06-2012----06-2013  . Eye surgery    . Exploratory laparotomy  1960s    For peritonitis of undetermined cause  . Esophagogastroduodenoscopy N/A 02/16/2015    Procedure: ESOPHAGOGASTRODUODENOSCOPY (EGD);  Surgeon: [name], MD;  Location: [facility];  Service: Gastroenterology;  Laterality: N/A;   HPI:  74 y.o. female, with past medical history of hypertension, carotid artery disease, atrial fibrillation, CVA 2008, pna, breast cancer, hyperlipidemia, patient  presents with complaints of hematochezia. Found to have acute GI bleed. CXR recurrent to rounded masslike area of consolidation in the right lower lobe. Given that this area was present previously then resolved and has recurrent quickly, this is most compatible with rounded pneumonia. Small right pleural effusion with adjacent right lower lobe atelectasis or consolidation. Per MD not differential diagnosis is aspiration pneumonia (specially in a very highly debilitated woman) vs post obstructive pneumonia (unlikely given the fact that the CT cleared except for a cavity after treatment).   Assessment / Plan / Recommendation Clinical Impression  Mildly increased work of breathing during swallow assessment. Oral transfer and swallow initiaton timely, no indications of airway enroachment or residue. Pt denies past (with CVA) or recent s/s aspiration or esophageal impairments. Recommend she continue thin liquds per GI recommendations due to GI bleed. MD to upgrade when appropriate. No further ST needed. If pt continues to have repeat pna's in the future, objective assessment should be considered.      Aspiration Risk  No limitations    Diet Recommendation  Thin liquids (clear liquid diet per GI), upgrade per GI when appropriate  Medication Administration: Whole meds with liquid    Other  Recommendations Oral Care Recommendations: Oral care BID   Follow up Recommendations  None    Frequency and Duration            Swallow Study      Oral/Motor/Sensory Function Overall Oral Motor/Sensory Function: Within functional limits  Ice Chips Ice chips: Not tested   Thin Liquid Thin Liquid: Within functional limits Presentation: Cup;Straw    Nectar Thick Nectar Thick Liquid: Not tested   Honey Thick Honey Thick Liquid: Not tested   Puree Puree: Within functional limits   Solid Solid: Not tested (clear liquid diet due to GI bleed)       Houston Siren 02/17/2015,9:45 AM  [name]  [name].Ed [employer] [phone]

## 2015-02-18 LAB — CBC
HCT: 36.5 % (ref 36.0–46.0)
Hemoglobin: 12.4 g/dL (ref 12.0–15.0)
MCH: 30.3 pg (ref 26.0–34.0)
MCHC: 34 g/dL (ref 30.0–36.0)
MCV: 89.2 fL (ref 78.0–100.0)
PLATELETS: 551 10*3/uL — AB (ref 150–400)
RBC: 4.09 MIL/uL (ref 3.87–5.11)
RDW: 15.6 % — ABNORMAL HIGH (ref 11.5–15.5)
WBC: 22.4 10*3/uL — AB (ref 4.0–10.5)

## 2015-02-18 LAB — H. PYLORI ANTIBODY, IGG: H Pylori IgG: <0.9 U/mL (ref 0.0–0.8)

## 2015-02-18 MED ORDER — SODIUM CHLORIDE 0.9 % IV SOLN: INTRAVENOUS | Status: DC

## 2015-02-18 MED ORDER — POTASSIUM CHLORIDE CRYS ER 20 MEQ PO TBCR
40.0000 meq | EXTENDED_RELEASE_TABLET | Freq: Once | ORAL | Status: AC
  Administered 2015-02-18: 40 meq via ORAL
  Filled 2015-02-18: qty 2

## 2015-02-18 MED ORDER — SODIUM CHLORIDE 3 % IN NEBU
3.0000 mL | INHALATION_SOLUTION | RESPIRATORY_TRACT | Status: DC | PRN
  Administered 2015-02-18: 3 mL via RESPIRATORY_TRACT
  Filled 2015-02-18 (×3): qty 4

## 2015-02-18 MED ORDER — METOPROLOL TARTRATE 50 MG PO TABS
75.0000 mg | ORAL_TABLET | Freq: Two times a day (BID) | ORAL | Status: DC
  Administered 2015-02-18 – 2015-02-20 (×5): 75 mg via ORAL
  Filled 2015-02-18 (×9): qty 1

## 2015-02-18 NOTE — Progress Notes (Addendum)
Sputum induction performed with success of sputum sample at this time. Patient states she never is able to cough anything up, however, small sputum sample was coughed up and sent to lab by RN. Duo neb given post hypertonic solution per MD order.

## 2015-02-18 NOTE — Progress Notes (Addendum)
[place] TEAM 1 - Stepdown/ICU TEAM PROGRESS NOTE  [name] OYD:[id number] DOB: [birth year] DOA: 02/14/2015 PCP: Wende Neighbors, MD  Admit HPI / Brief Narrative: 74 y.o. F Hx HTN, CAD, Atrial Fibrillation on warfarin, Subclavian Steal Syndrome, Carotid Artery Occlusion, Breast Cancer S/P left mastectomy, Meningioma, HLD, and a chronic right ankle venous stasis ulcer who presented with complaints of hematochezia.  She had blood work done by her PCP on November 4, and when her hemoglobin came back at 8.3 she was told to come to the ED for further evaluation.  Patient reported generalized weakness and fatigue over a few days, dark-colored stools for a few days, and then bright red blood per rectum over 24 hours.  Patient is on aspirin and Plavix for carotid artery disease, as well as warfarin for A. Fib.  In the ED her INR was >10, Hgb 6.2.  CXR revealed a cavitary R lung lesion.    HPI/Subjective: The patient is resting comfortably in bed.  She has not noted any further bleeding today.  She admits to having a poor appetite.  She is anxious to get up and get moving around.  She denies chest pain fevers chills nausea vomiting or cough.  She in fact had quite a difficult time producing sputum for the respiratory therapist.  Assessment/Plan:  Acute GI bleed -EGD noted normal esophagus, 5cm HH, gastritis w/ numerous clean based linear ulcers of stomach, and a few clean based ulcers in the duodenal bulb -GI suggests we cont Protinix BID -H pylori screen negative  -patient on aspirin, Plavix, warfarin -transfused 4 units PRBC thus far -transfused 5 units FFP thus far   Acute blood loss anemia -transfuse prn to keep Hgb 8.0 or > (hx of CAD) -Check hemoglobin now  SIRS -  Hemorrhagic shock  -Resolved  Supratherapeutic INR / warfarin induced coagulopathy -Patient has been given vitamin K and FFP - INR now corrected - suspect this was due to 2 weeks of very poor nutrition at home - I have counseled  the patient on the importance of consistent nutrition in the face of ongoing Coumadin dosing   Atrial fibrillation -NSR presently - continue with amiodarone - resume metoprolol - warfarin on hold given GI bleed - consider resuming tomorrow if Hgb stable  **addendum - during my visit today the patient asked if she could transition to Eliquis instead of Coumadin - a detailed/extensive review of her cardiology office notes reveal that [name] offered to transition her to this medication in August 2013 but this was not done as the patient herself simply desired to remain on Coumadin at that time - if she persist in her desire to change to Eliquis I do feel this would be reasonable  Essential HTN  -not an active issue at present - follow trend   CAD native artery -resume aspirin and Plavix prior to d/c if Hgb proves to remain stable   RLL lung mass / cavitary lesion - 9.5 x 6.7cm - cavitary PNA  -previously noted (Feb 2016), resolved (June 2016), and has now recurred  -Quantiferon gold indeterminate  -sputum AFB pending  -Influenza panel/respiratory virus panel negative  -was previously being worked up as outpt by Dr. Chase Caller - PCCM has signed off    Acute renal failure -due to volume depletion - resolved   Thrombocytosis  -chronic - at baseline  Chronic lower back pain -Per patient and husband was supposed to been evaluated by MRI, if time allows obtain MRI; NOTE patient  very claustrophobic - severe compression fx noted at T12, along w/ mod-severe new T10 compression fx   Right ankle venous stasis ulcer -Husband states emphatically she does not want debriding - has been using Santyl and dressing changes- WOC to see   Severe protein calorie malnutrition -advance diet - nutrition consult   Hypokalemia -due to poor intake - supplement and recheck in AM  MRSA screen +  Code Status: FULL Family Communication: spoke w/ husband at bedside  Disposition Plan: Stable for transfer to  medical bed if negative pressure isolation available - begin PT/OT - advance diet - follow serial hemoglobin  Consultants: [facility]  PCCM  Procedures: EGD - as noted above   Antibiotics: Zosyn 11/7 > Vancomycin 11/7 >  DVT prophylaxis: SCDs  Objective: Blood pressure 125/75, pulse 89, temperature 98.5 F (36.9 C), temperature source Axillary, resp. rate 17, height '5\' 5"'$  (1.651 m), weight 52.7 kg (116 lb 2.9 oz), SpO2 92 %.  Intake/Output Summary (Last 24 hours) at 02/18/15 1103 Last data filed at 02/18/15 0900  Gross per 24 hour  Intake   2225 ml  Output   2650 ml  Net   -425 ml   Exam: General: No acute respiratory distress - alert and conversant  Lungs: Clear to auscultation bilaterally without wheezes/crackles Cardiovascular: Regular rate and rhythm without murmur gallop or rub Abdomen: Nontender, nondistended, soft, bowel sounds positive, no rebound, no ascites, no appreciable mass Extremities: No significant cyanosis, clubbing, edema bilateral lower extremities  Data Reviewed: Basic Metabolic Panel:  Recent Labs Lab 02/14/15 2005 02/15/15 0450 02/17/15 0318  NA 132* 137 134*  K 5.3* 4.8 3.4*  CL 102 105 106  CO2 22 22 21*  GLUCOSE 145* 106* 83  BUN 44* 31* 7  CREATININE 1.23* 0.82 0.59  CALCIUM 8.4* 8.2* 7.5*    CBC:  Recent Labs Lab 02/14/15 2005 02/15/15 0450 02/15/15 1826 02/16/15 0247 02/16/15 0915 02/16/15 2300 02/17/15 0318  WBC 41.3* 33.1*  --   --   --  25.6* 22.1*  NEUTROABS 36.5*  --   --   --   --   --   --   HGB 6.2* 8.8* 7.7* 7.6* 7.5* 11.1* 10.5*  HCT 18.5* 26.0*  26.4* 22.9* 22.6* 22.2* 32.9* 31.6*  MCV 89.8 88.4  --   --   --  88.2 88.0  PLT 622* 475*  --   --   --  467* 451*    Liver Function Tests:  Recent Labs Lab 02/14/15 2005 02/17/15 0318  AST 20 20  ALT 16 17  ALKPHOS 117 102  BILITOT 0.2* 0.9  PROT 6.3* 5.4*  ALBUMIN 1.8* 1.6*    Recent Labs Lab 02/14/15 2005  LIPASE 13   Coags:  Recent  Labs Lab 02/15/15 0450 02/15/15 0625 02/15/15 1421 02/16/15 1025 02/17/15 0318  INR 1.64* 1.56* 1.29 1.43 1.44    Recent Results (from the past 240 hour(s))  Culture, blood (routine x 2)     Status: None (Preliminary result)   Collection Time: 02/14/15 10:35 PM  Result Value Ref Range Status   Specimen Description RIGHT ANTECUBITAL  Final   Special Requests BOTTLES DRAWN AEROBIC AND ANAEROBIC 6CC  Final   Culture NO GROWTH 4 DAYS  Final   Report Status PENDING  Incomplete  Culture, blood (routine x 2)     Status: None (Preliminary result)   Collection Time: 02/14/15 10:40 PM  Result Value Ref Range Status   Specimen Description BLOOD RIGHT  WRIST  Final   Special Requests BOTTLES DRAWN AEROBIC ONLY 6CC  Final   Culture NO GROWTH 4 DAYS  Final   Report Status PENDING  Incomplete  MRSA PCR Screening     Status: Abnormal   Collection Time: 02/15/15  4:15 AM  Result Value Ref Range Status   MRSA by PCR POSITIVE (A) NEGATIVE Corrected    Comment:        The GeneXpert MRSA Assay (FDA approved for NASAL specimens only), is one component of a comprehensive MRSA colonization surveillance program. It is not intended to diagnose MRSA infection nor to guide or monitor treatment for MRSA infections. RESULT CALLED TO, READ BACK BY AND VERIFIED WITH: A PETTIFORD @ 8299 02/08/15 MKELLY        The GeneXpert MRSA Assay (FDA approved for NASAL specimens only), is one component of a comprehensive MRSA colonization surveillance program. It is not intended to diagnose MRSA infection nor to guide or monitor treatment for MRSA infections. CORRECTED ON 11/08 AT 3716: PREVIOUSLY REPORTED AS RESISTANT        The GeneXpert MRSA Assay (FDA approved for NASAL specimens only), is one component of a comprehensive MRSA colonization surveillance program. It is not intended to diagnose MRSA  infection nor to guide or monitor treatment for MRSA infections. RESULT CALLED TO, READ BACK BY AND VERIFIED  WITH: A PETTIFORD @ 9678 02/08/15 [place] ELLY   Respiratory virus panel     Status: None   Collection Time: 02/15/15  2:49 PM  Result Value Ref Range Status   Respiratory Syncytial Virus A Negative Negative Final   Respiratory Syncytial Virus B Negative Negative Final   Influenza A Negative Negative Final   Influenza B Negative Negative Final   Parainfluenza 1 Negative Negative Final   Parainfluenza 2 Negative Negative Final   Parainfluenza 3 Negative Negative Final   Metapneumovirus Negative Negative Final   Rhinovirus Negative Negative Final   Adenovirus Negative Negative Final    Comment: (NOTE) Performed At: [facility] [place], [place] [id number] Lindon Romp MD WC:[id number]      Studies:   Recent x-ray studies have been reviewed in detail by the Attending Physician  Scheduled Meds:  Scheduled Meds: . sodium chloride   Intravenous Once  . amiodarone  200 mg Oral Daily  . antiseptic oral rinse  7 mL Mouth Rinse BID  . Chlorhexidine Gluconate Cloth  6 each Topical Q0600  . collagenase   Topical Daily  . mupirocin ointment  1 application Nasal BID  . pantoprazole  40 mg Oral BID  . piperacillin-tazobactam (ZOSYN)  IV  3.375 g Intravenous Q8H  . sodium chloride  3 mL Intravenous Q12H  . sodium chloride HYPERTONIC  15 mL Nebulization Once  . vancomycin  500 mg Intravenous Q12H    Time spent on care of this patient: 35 mins   Landis Dowdy T , MD   [facility] Office  [phone] Pager - Text Page per Shea Evans as per below:  On-Call/Text Page:      [email or web address]      password [email or web address]  If 7PM-7AM, please contact night-coverage [email or web address] Password [email or web address] 02/18/2015, 11:03 AM   LOS: 4 days

## 2015-02-18 NOTE — Progress Notes (Signed)
ANTIBIOTIC CONSULT NOTE - FOLLOW UP  Pharmacy Consult for Zosyn and Vancomycin  Indication: PNA  Patient Measurements: Height: '5\' 5"'$  (165.1 cm) Weight: 116 lb 2.9 oz (52.7 kg) IBW/kg (Calculated) : 57  Vital Signs: Temp: 97.6 F (36.4 C) (11/11 0500) Temp Source: Oral (11/11 0500) BP: 121/67 mmHg (11/11 0500) Pulse Rate: 88 (11/11 0500)   Intake/Output from previous day: 11/10 0701 - 11/11 0700 In: 1655 [P.O.:600; I.V.:1725; IV Piggyback:150] Out: 850 [Urine:850] Intake/Output from this shift:    Labs:  Recent Labs  02/16/15 0915 02/16/15 2300 02/17/15 0318  WBC  --  25.6* 22.1*  HGB 7.5* 11.1* 10.5*  PLT  --  467* 451*  CREATININE  --   --  0.59  *Estimated CrCl: 55-65 ml/min  Microbiology: Anti-infective's Vanc 11/8>>  Zosyn 11/8>>   Cx data:  11/7 BCx2>>  11/8 RSV>>  11/8 MRSA pcr +  11/8 flu - negative  11/8: resp viral panel: negative 11/8: quantiferon Gold: negative   Assessment: 74yoF admitted with acute GU bleed found to have R LL lung mass vs cavitary lesion in setting of cavitary necrotizing pna earlier this year.  Goal of Therapy:  Vancomycin trough level 15-20 mcg/ml  Plan:  1. Continue vancomycin 500 mg q 12H as previously ordered 2. Obtain trough before 0600 dose on 11/12 to evaluate current treatment regimen (may require higher dose based on previous dosing/levels) 3. Zosyn EI q 8H 4. F/u cx data, clinical response and narrow abx as feasible   [name], PharmD, BCPS 02/18/2015, 7:14 AM Pager: [phone]    [name] 02/18/2015,7:07 AM

## 2015-02-18 NOTE — Care Management Important Message (Signed)
Important Message  Patient Details  Name: [name] MRN: [id number] Date of Birth: July 14, 1940   Medicare Important Message Given:  Yes    Lacretia Leigh, RN 02/18/2015, 3:19 PM

## 2015-02-19 DIAGNOSIS — D689 Coagulation defect, unspecified: Secondary | ICD-10-CM

## 2015-02-19 DIAGNOSIS — I10 Essential (primary) hypertension: Secondary | ICD-10-CM

## 2015-02-19 DIAGNOSIS — I779 Disorder of arteries and arterioles, unspecified: Secondary | ICD-10-CM

## 2015-02-19 DIAGNOSIS — I480 Paroxysmal atrial fibrillation: Secondary | ICD-10-CM

## 2015-02-19 DIAGNOSIS — M549 Dorsalgia, unspecified: Secondary | ICD-10-CM

## 2015-02-19 DIAGNOSIS — G8929 Other chronic pain: Secondary | ICD-10-CM

## 2015-02-19 DIAGNOSIS — I2510 Atherosclerotic heart disease of native coronary artery without angina pectoris: Secondary | ICD-10-CM

## 2015-02-19 DIAGNOSIS — N179 Acute kidney failure, unspecified: Secondary | ICD-10-CM

## 2015-02-19 LAB — COMPREHENSIVE METABOLIC PANEL
ALK PHOS: 111 U/L (ref 38–126)
ALT: 17 U/L (ref 14–54)
ANION GAP: 10 (ref 5–15)
AST: 18 U/L (ref 15–41)
Albumin: 1.6 g/dL — ABNORMAL LOW (ref 3.5–5.0)
BILIRUBIN TOTAL: 0.9 mg/dL (ref 0.3–1.2)
BUN: 6 mg/dL (ref 6–20)
CALCIUM: 8.4 mg/dL — AB (ref 8.9–10.3)
CO2: 25 mmol/L (ref 22–32)
Chloride: 99 mmol/L — ABNORMAL LOW (ref 101–111)
Creatinine, Ser: 0.61 mg/dL (ref 0.44–1.00)
GFR calc non Af Amer: >60 mL/min (ref >60)
Glucose, Bld: 71 mg/dL (ref 65–99)
POTASSIUM: 4 mmol/L (ref 3.5–5.1)
Sodium: 134 mmol/L — ABNORMAL LOW (ref 135–145)
TOTAL PROTEIN: 5.9 g/dL — AB (ref 6.5–8.1)

## 2015-02-19 LAB — VANCOMYCIN, TROUGH: VANCOMYCIN TR: 8 ug/mL — AB (ref 10.0–20.0)

## 2015-02-19 LAB — CBC
HCT: 35.5 % — ABNORMAL LOW (ref 36.0–46.0)
Hemoglobin: 11.9 g/dL — ABNORMAL LOW (ref 12.0–15.0)
MCH: 30.1 pg (ref 26.0–34.0)
MCHC: 33.5 g/dL (ref 30.0–36.0)
MCV: 89.9 fL (ref 78.0–100.0)
Platelets: 609 10*3/uL — ABNORMAL HIGH (ref 150–400)
RBC: 3.95 MIL/uL (ref 3.87–5.11)
RDW: 15.3 % (ref 11.5–15.5)
WBC: 21.4 10*3/uL — ABNORMAL HIGH (ref 4.0–10.5)

## 2015-02-19 LAB — CULTURE, BLOOD (ROUTINE X 2)
CULTURE: NO GROWTH
CULTURE: NO GROWTH

## 2015-02-19 MED ORDER — APIXABAN 5 MG PO TABS
5.0000 mg | ORAL_TABLET | Freq: Two times a day (BID) | ORAL | Status: DC
  Administered 2015-02-19 – 2015-02-20 (×2): 5 mg via ORAL
  Filled 2015-02-19 (×3): qty 1

## 2015-02-19 MED ORDER — VANCOMYCIN HCL IN DEXTROSE 750-5 MG/150ML-% IV SOLN
750.0000 mg | Freq: Two times a day (BID) | INTRAVENOUS | Status: DC
  Administered 2015-02-19 – 2015-02-20 (×2): 750 mg via INTRAVENOUS
  Filled 2015-02-19 (×3): qty 150

## 2015-02-19 MED ORDER — CLOPIDOGREL BISULFATE 75 MG PO TABS
75.0000 mg | ORAL_TABLET | Freq: Every day | ORAL | Status: DC
  Administered 2015-02-20: 75 mg via ORAL
  Filled 2015-02-19: qty 1

## 2015-02-19 MED ORDER — BOOST PLUS PO LIQD
237.0000 mL | Freq: Three times a day (TID) | ORAL | Status: DC
  Administered 2015-02-19 – 2015-02-20 (×2): 237 mL via ORAL
  Filled 2015-02-19 (×7): qty 237

## 2015-02-19 NOTE — Progress Notes (Signed)
[facility]                                                                              Patient Demographics  [name], is a 74 y.o. female, DOB - 1940/06/13, GGY:[id number]  Admit date - 02/14/2015   Admitting Physician Norval Morton, MD  Outpatient Primary MD for the patient is Wende Neighbors, MD  LOS - 5   Chief Complaint  Patient presents with  . Abnormal Lab Value   . Rectal Bleeding       Brief HPI   74 y.o. F Hx HTN, CAD, Atrial Fibrillation on warfarin, Subclavian Steal Syndrome, Carotid Artery Occlusion, Breast Cancer S/P left mastectomy, Meningioma, HLD, and a chronic right ankle venous stasis ulcer who presented with complaints of hematochezia. She had blood work done by her PCP on November 4, and when her hemoglobin came back at 8.3 she was told to come to the ED for further evaluation. Patient reported generalized weakness and fatigue over a few days, dark-colored stools for a few days, and then bright red blood per rectum over 24 hours. Patient is on aspirin and Plavix for carotid artery disease, as well as warfarin for A. Fib. In the ED her INR was >10, Hgb 6.2. CXR revealed a cavitary R lung lesion.    Assessment & Plan   Principal problem Acute GI bleed: Patient presented with hemoglobin of 6.2, INR above 10 at the time of admission. - Patient was admitted to stepdown unit, transfused 4 units packed RBCs and 5 units of FFP through hospitalization. - Patient was on aspirin, Plavix, warfarin prior to admission. - Gastro-oncology was consulted. Patient underwent endoscopy on 11/9 which showed normal esophagus, 5cm HH, gastritis w/ numerous clean based linear ulcers of stomach, and a few clean based ulcers in the duodenal bulb -Continue Protonix twice a day as recommended by gastroenterology, avoid NSAIDs -H pylori screen negative   Active problems Acute blood loss anemia - H&H stable now  SIRS - Hemorrhagic shock   -Resolved  Supratherapeutic INR / warfarin induced coagulopathy -Patient has been given vitamin K and FFP, INR now corrected, possibly due to poor oral intake for the last 2 weeks  Atrial fibrillation - Currently normal sinus rhythm, continue amiodarone and metoprolol.  - Warfarin currently on hold. However prior cardiology notes, per [name], had offered her to transition her to low-dose eliquis however patient at that time wanted to remain on Coumadin  - Patient is also on aspirin and Plavix prior to the admission, I have consulted cardiology today to streamline her dual antiplatelet agents, Coumadin versus NOAC, d/w Dr Radford Pax, will follow recommendations   Essential HTN  - stable   CAD native artery - follow cardiology recommendations regarding antiplatelet & anticoagulation therapy   RLL lung mass / cavitary lesion - 9.5 x 6.7cm - cavitary PNA , aspiration versus postobstructive. Per pulmonology, not TB. -previously noted (Feb 2016), resolved (June 2016), and has now recurred  -Quantiferon gold indeterminate  -sputum AFB pending , DC droplet precautions if negative -Influenza panel/respiratory virus panel negative  - PCCM has signed off, recommended no bronchoscopy, completion of  the treatment course as HCAP, repeat CT in 8-12 weeks, follow-up outpatient with Dr. Chase Caller    Acute renal failure -due to volume depletion - resolved   Thrombocytosis  -chronic - at baseline  Chronic lower back pain -CT of the lumbar spine on 10/24 had shown remote T12 compression fracture with bony retropulsion, moderately severe central canal stenosis at and just below T11 and 12 level  - Continue pain control for now, outpatient follow-up with neurosurgery per PCP referral  Right ankle venous stasis ulcer - patient does not want debriding, continue Santyl and dressing changesper wound care recommendations  Severe protein calorie malnutrition - nutrition consult   Code Status: Full  code  Family Communication: Discussed in detail with the patient, all imaging results, lab results explained to the patient    Disposition Plan: *Physical therapy today, await cardiology recommendations, likely DC in a.m.   Time Spent in minutes  25  minutes  Procedures  Endoscopy  Consults   Gastroenterology  Pulmonology  Cardiology   DVT Prophylaxis  SCD's  Medications  Scheduled Meds: . amiodarone  200 mg Oral Daily  . antiseptic oral rinse  7 mL Mouth Rinse BID  . collagenase   Topical Daily  . metoprolol  75 mg Oral BID  . mupirocin ointment  1 application Nasal BID  . pantoprazole  40 mg Oral BID  . piperacillin-tazobactam (ZOSYN)  IV  3.375 g Intravenous Q8H  . vancomycin  500 mg Intravenous Q12H   Continuous Infusions: . sodium chloride 10 mL/hr at 02/18/15 1407   PRN Meds:.ipratropium-albuterol, morphine injection, ondansetron **OR** ondansetron (ZOFRAN) IV, oxyCODONE   Antibiotics   Anti-infectives    Start     Dose/Rate Route Frequency Ordered Stop   02/16/15 0600  vancomycin (VANCOCIN) 500 mg in sodium chloride 0.9 % 100 mL IVPB  Status:  Discontinued     500 mg 100 mL/hr over 60 Minutes Intravenous Every 24 hours 02/15/15 0538 02/15/15 1447   02/15/15 2300  vancomycin (VANCOCIN) 500 mg in sodium chloride 0.9 % 100 mL IVPB  Status:  Discontinued     500 mg 100 mL/hr over 60 Minutes Intravenous Every 24 hours 02/14/15 2301 02/15/15 0538   02/15/15 1800  vancomycin (VANCOCIN) 500 mg in sodium chloride 0.9 % 100 mL IVPB     500 mg 100 mL/hr over 60 Minutes Intravenous Every 12 hours 02/15/15 1447     02/15/15 0700  piperacillin-tazobactam (ZOSYN) IVPB 3.375 g     3.375 g 12.5 mL/hr over 240 Minutes Intravenous Every 8 hours 02/14/15 2252     02/14/15 2300  piperacillin-tazobactam (ZOSYN) IVPB 3.375 g     3.375 g 100 mL/hr over 30 Minutes Intravenous  Once 02/14/15 2252 02/15/15 0601   02/14/15 2300  vancomycin (VANCOCIN) IVPB 1000 mg/200 mL premix      1,000 mg 200 mL/hr over 60 Minutes Intravenous  Once 02/14/15 2252 02/15/15 0631        Subjective:   [name] was seen and examined today. Denies any further GI bleeding. Feels weak although up for physical therapy evaluation.  Patient denies dizziness, chest pain, shortness of breath, abdominal pain,  new weakness, numbess, tingling. No acute events overnight.    Objective:   Blood pressure 146/75, pulse 76, temperature 97.7 F (36.5 C), temperature source Oral, resp. rate 19, height '5\' 5"'$  (1.651 m), weight 52.7 kg (116 lb 2.9 oz), SpO2 95 %.  Wt Readings from Last 3 Encounters:  02/15/15 52.7 kg (116  lb 2.9 oz)  01/04/15 53.887 kg (118 lb 12.8 oz)  01/03/15 53.797 kg (118 lb 9.6 oz)     Intake/Output Summary (Last 24 hours) at 02/19/15 0928 Last data filed at 02/19/15 0700  Gross per 24 hour  Intake    540 ml  Output    500 ml  Net     40 ml    Exam  General: Alert and oriented x 3, NAD, frail  HEENT:  PERRLA, EOMI, Anicteric Sclera, mucous membranes moist.   Neck: Supple, no JVD, no masses  CVS: S1 S2 auscultated, no rubs, murmurs or gallops. Regular rate and rhythm.  Respiratory: Clear to auscultation bilaterally, no wheezing, rales or rhonchi  Abdomen: Soft, nontender, nondistended, + bowel sounds  Ext: no cyanosis clubbing or edema  Neuro: AAOx3, Cr N's II- XII. Strength 5/5 upper and lower extremities bilaterally  Skin: Right ankle dressing intact   Psych: Normal affect and demeanor, alert and oriented x3    Data Review   Micro Results Recent Results (from the past 240 hour(s))  Culture, blood (routine x 2)     Status: None   Collection Time: 02/14/15 10:35 PM  Result Value Ref Range Status   Specimen Description RIGHT ANTECUBITAL  Final   Special Requests BOTTLES DRAWN AEROBIC AND ANAEROBIC 6CC  Final   Culture NO GROWTH 5 DAYS  Final   Report Status 02/19/2015 FINAL  Final  Culture, blood (routine x 2)     Status: None   Collection Time:  02/14/15 10:40 PM  Result Value Ref Range Status   Specimen Description BLOOD RIGHT WRIST  Final   Special Requests BOTTLES DRAWN AEROBIC ONLY [place]  Final   Culture NO GROWTH 5 DAYS  Final   Report Status 02/19/2015 FINAL  Final  MRSA PCR Screening     Status: Abnormal   Collection Time: 02/15/15  4:15 AM  Result Value Ref Range Status   MRSA by PCR POSITIVE (A) NEGATIVE Corrected    Comment:        The GeneXpert MRSA Assay (FDA approved for NASAL specimens only), is one component of a comprehensive MRSA colonization surveillance program. It is not intended to diagnose MRSA infection nor to guide or monitor treatment for MRSA infections. RESULT CALLED TO, READ BACK BY AND VERIFIED WITH: A PETTIFORD @ 7124 02/08/15 MKELLY        The GeneXpert MRSA Assay (FDA approved for NASAL specimens only), is one component of a comprehensive MRSA colonization surveillance program. It is not intended to diagnose MRSA infection nor to guide or monitor treatment for MRSA infections. CORRECTED ON 11/08 AT 5809: PREVIOUSLY REPORTED AS RESISTANT        The GeneXpert MRSA Assay (FDA approved for NASAL specimens only), is one component of a comprehensive MRSA colonization surveillance program. It is not intended to diagnose MRSA  infection nor to guide or monitor treatment for MRSA infections. RESULT CALLED TO, READ BACK BY AND VERIFIED WITH: A PETTIFORD @ 9833 02/08/15 [place] ELLY   Respiratory virus panel     Status: None   Collection Time: 02/15/15  2:49 PM  Result Value Ref Range Status   Respiratory Syncytial Virus A Negative Negative Final   Respiratory Syncytial Virus B Negative Negative Final   Influenza A Negative Negative Final   Influenza B Negative Negative Final   Parainfluenza 1 Negative Negative Final   Parainfluenza 2 Negative Negative Final   Parainfluenza 3 Negative Negative Final   Metapneumovirus Negative Negative  Final   Rhinovirus Negative Negative Final   Adenovirus  Negative Negative Final    Comment: (NOTE) Performed At: [facility] [place], [place] [id number] Lindon Romp MD XB:[id number]     Radiology Reports Ct Chest Wo Contrast  02/15/2015  CLINICAL DATA:  Right lower lobe lung mass. EXAM: CT CHEST WITHOUT CONTRAST TECHNIQUE: Multidetector CT imaging of the chest was performed following the standard protocol without IV contrast. COMPARISON:  Multiple chest CTs including 05/20/2014, 07/08/2014, 09/08/2014. FINDINGS: Large rounded masslike area of consolidation noted in the right lower lobe, measuring 9.5 x 6.7 cm. There is adjacent posterior right lower lobe airspace consolidation and small right pleural effusion. This is in an area of similar rounded opacification in February which resolved in June. I suspect this represents an area of rounded pneumonia. There are small areas of central cavitation. 5 mm nodule in the left lower lobe on image 39 is stable. No confluent opacity on the left. Mildly enlarged precarinal lymph node has a short axis diameter of 16 mm compared with 10 mm previously. No axillary adenopathy. No definite visible hilar adenopathy although evaluation is difficult without intravenous contrast. Chest wall soft tissues are unremarkable aortic and coronary artery calcifications noted. Heart is normal size. Aorta is normal caliber. Imaging into the upper abdomen shows no acute findings. Severe compression fracture at T12 is stable. New moderate to severe compression fracture at T10. IMPRESSION: Recurrent to rounded masslike area of consolidation in the right lower lobe. Given that this area was present previously then resolved and has recurrent quickly, this is most compatible with rounded pneumonia. Small right pleural effusion with adjacent right lower lobe atelectasis or consolidation. Mildly enlarged precarinal lymph node, likely reactive. Coronary artery and aortic atherosclerosis. Electronically Signed   By: Rolm Baptise M.D.   On: 02/15/2015 16:31   Ct Lumbar Spine Wo Contrast  01/31/2015  CLINICAL DATA:  Low back pain for 3 months which has markedly worsened over the past 2 weeks. No known injury. Initial encounter. EXAM: CT LUMBAR SPINE WITHOUT CONTRAST TECHNIQUE: Multidetector CT imaging of the lumbar spine was performed without intravenous contrast administration. Multiplanar CT image reconstructions were also generated. COMPARISON:  Plain films lumbar spine 05/05/2012. CT chest 05/05/2012. FINDINGS: Marked convex right scoliosis is identified. The patient has a severe compression fracture of T12 were vertebral plana deformity is identified and there is bony retropulsion into the central spinal canal. The appearance is unchanged compared to the prior chest CT. No other vertebral fracture is identified. Remote healed fracture of the left twelfth rib is noted. Paraspinous structures demonstrate a small hiatal hernia. Aortoiliac atherosclerosis is identified. No aneurysm. T11-12: Bone is retropulsed into the central spinal canal. Moderately severe central canal narrowing is present. The foramina are open. The appearance is unchanged. T12-L1: Mild bony retropulsion off the inferior endplate causes mild central canal stenosis. The foramina are open. L1-2: Shallow disc bulge and endplate spur without central canal or foraminal stenosis. L2-3: There is some facet degenerative disease. Mild retrolisthesis is noted. The central canal and foramina appear open. L3-4: Facet arthropathy is identified and there is a shallow disc bulge with endplate spur. Moderate central canal narrowing is seen. Moderate left foraminal narrowing also identified. The right foramen is open. L4-5: Disc, endplate spur and facet arthropathy cause moderately severe central canal stenosis. Left worse than right foraminal narrowing is identified. L5-S1: There is facet degenerative change and a shallow disc bulge but the central spinal canal and  left  foramen appear open. Moderate to moderate severe right foraminal narrowing is seen. IMPRESSION: No acute abnormality. Remote T12 compression fracture with bony retropulsion resulting in moderately severe central canal stenosis at and just below the T11-12 level. Multilevel spondylosis appearing worst at L4-5 and L3-4 as above. Severe convex right scoliosis. Electronically Signed   By: Inge Rise M.D.   On: 01/31/2015 16:15   Dg Chest Port 1 View  02/14/2015  CLINICAL DATA:  GI bleeding EXAM: PORTABLE CHEST 1 VIEW COMPARISON:  05/20/2014 FINDINGS: There is a dense opacity at the right base with rounded masslike appearance. Findings are similar to abnormality seen on 05/20/2014 chest CT. The left lung is well aerated. Normal heart size and stable aortic and hilar contours given technique. Changes of left mastectomy and axillary dissection. IMPRESSION: Right basilar consolidation with rounded masslike area. Appearance very similar to abnormality seen 05/20/2014, which largely resolved on 09/08/2014 CT. Recurrent pneumonia or inflammatory cavity are primary considerations given rapid recurrence. Recommend chest CT. Electronically Signed   By: Monte Fantasia M.D.   On: 02/14/2015 21:46    CBC  Recent Labs Lab 02/14/15 2005 02/15/15 0450  02/16/15 0915 02/16/15 2300 02/17/15 0318 02/18/15 1448 02/19/15 0540  WBC 41.3* 33.1*  --   --  25.6* 22.1* 22.4* 21.4*  HGB 6.2* 8.8*  < > 7.5* 11.1* 10.5* 12.4 11.9*  HCT 18.5* 26.0*  26.4*  < > 22.2* 32.9* 31.6* 36.5 35.5*  PLT 622* 475*  --   --  467* 451* 551* 609*  MCV 89.8 88.4  --   --  88.2 88.0 89.2 89.9  MCH 30.1 29.9  --   --  29.8 29.2 30.3 30.1  MCHC 33.5 33.8  --   --  33.7 33.2 34.0 33.5  RDW 15.4 14.1  --   --  14.8 15.1 15.6* 15.3  LYMPHSABS 2.3  --   --   --   --   --   --   --   MONOABS 2.4*  --   --   --   --   --   --   --   EOSABS 0.0  --   --   --   --   --   --   --   BASOSABS 0.1  --   --   --   --   --   --   --   < > =  values in this interval not displayed.  Chemistries   Recent Labs Lab 02/14/15 2005 02/15/15 0450 02/17/15 0318 02/19/15 0540  NA 132* 137 134* 134*  K 5.3* 4.8 3.4* 4.0  CL 102 105 106 99*  CO2 22 22 21* 25  GLUCOSE 145* 106* 83 71  BUN 44* 31* 7 6  CREATININE 1.23* 0.82 0.59 0.61  CALCIUM 8.4* 8.2* 7.5* 8.4*  AST 20  --  20 18  ALT 16  --  17 17  ALKPHOS 117  --  102 111  BILITOT 0.2*  --  0.9 0.9   ------------------------------------------------------------------------------------------------------------------ estimated creatinine clearance is 49.8 mL/min (by C-G formula based on Cr of 0.61). ------------------------------------------------------------------------------------------------------------------ No results for input(s): HGBA1C in the last 72 hours. ------------------------------------------------------------------------------------------------------------------ No results for input(s): CHOL, HDL, LDLCALC, TRIG, CHOLHDL, LDLDIRECT in the last 72 hours. ------------------------------------------------------------------------------------------------------------------ No results for input(s): TSH, T4TOTAL, T3FREE, THYROIDAB in the last 72 hours.  Invalid input(s): FREET3 ------------------------------------------------------------------------------------------------------------------ No results for input(s): VITAMINB12, FOLATE, FERRITIN, TIBC, IRON, RETICCTPCT in the last 72 hours.  Coagulation profile  Recent Labs Lab 02/15/15 0450 02/15/15 0625 02/15/15 1421 02/16/15 1025 02/17/15 0318  INR 1.64* 1.56* 1.29 1.43 1.44    No results for input(s): DDIMER in the last 72 hours.  Cardiac Enzymes No results for input(s): CKMB, TROPONINI, MYOGLOBIN in the last 168 hours.  Invalid input(s): CK ------------------------------------------------------------------------------------------------------------------ Invalid input(s): POCBNP  No results for input(s):  GLUCAP in the last 72 hours.   [name] M.D. [facility] 02/19/2015, 9:28 AM  Pager: [phone] Between 7am to 7pm - call Pager - [phone]  After 7pm go to [email or web address] - password [email or web address]  Call night coverage person covering after 7pm

## 2015-02-19 NOTE — Progress Notes (Signed)
ANTIBIOTIC CONSULT NOTE - FOLLOW UP  Pharmacy Consult for Zosyn and Vancomycin  Indication: PNA  Patient Measurements: Height: '5\' 5"'$  (165.1 cm) Weight: 116 lb 2.9 oz (52.7 kg) IBW/kg (Calculated) : 57  Vital Signs: Temp: 97.7 F (36.5 C) (11/12 0336) Temp Source: Oral (11/12 0336) BP: 146/75 mmHg (11/12 0336) Pulse Rate: 76 (11/12 0336)   Intake/Output from previous day: 11/11 0701 - 11/12 0700 In: 980 [P.O.:480; I.V.:450; IV Piggyback:50] Out: 2300 [Urine:2300] Intake/Output from this shift:    Labs:  Recent Labs  02/17/15 0318 02/18/15 1448 02/19/15 0540  WBC 22.1* 22.4* 21.4*  HGB 10.5* 12.4 11.9*  PLT 451* 551* 609*  CREATININE 0.59  --  0.61  *Estimated CrCl: 55-65 ml/min  Microbiology: Anti-infective's Vanc 11/8>>  Zosyn 11/8>>   11/12 VT 8. Verified drawn correctly.  Cx data:  11/7 BCx2>> NGF 11/8 RSV>> neg 11/8 MRSA pcr +  11/8 flu - negative  11/8: resp viral panel: negative 11/8: quantiferon Gold: Indeterminate 11/11: AFB pending H. Pylori Ag 0.9 (indeterminate)  Assessment: 74yoF admitted with acute GU bleed found to have R LL lung mass vs cavitary lesion in setting of cavitary necrotizing pna earlier this year. Currently afebrile with persistent leukocytosis.  Goal of Therapy:  Vancomycin trough level 15-20 mcg/ml  Plan:  Increase vancomycin to '750mg'$  q12h  Continue zosyn 3.'375mg'$  q8h F/u cx data, CBC, renal fcn, VT'@SS'$ , and clinical response Narrow abx as feasible   Stephens November, PharmD. PGY1 resident Pager [phone] 02/19/2015, 1:17 PM

## 2015-02-19 NOTE — Progress Notes (Signed)
Initial Nutrition Assessment  DOCUMENTATION CODES:   Severe malnutrition in context of chronic illness  INTERVENTION:  Provide Boost Plus po TID, each supplement provides 360 kcal and 14 grams of protein.   Encourage adequate PO intake.  NUTRITION DIAGNOSIS:   Malnutrition related to chronic illness as evidenced by energy intake < or equal to 75% for > or equal to 1 month, severe depletion of muscle mass.  GOAL:   Patient will meet greater than or equal to 90% of their needs  MONITOR:   PO intake, Supplement acceptance, Weight trends, Labs, I & O's  REASON FOR ASSESSMENT:   Consult Poor PO  ASSESSMENT:   74 y.o. F Hx HTN, CAD, Atrial Fibrillation on warfarin, Subclavian Steal Syndrome, Carotid Artery Occlusion, Breast Cancer S/P left mastectomy, Meningioma, HLD, and a chronic right ankle venous stasis ulcer who presented with complaints of hematochezia.  Pt reports appetite has been improving. Meal completion has been 50-65%. Pt reports she has been experiencing a decreased appetite PTA which has been ongoing over the past 2 months. Per Husband at bedside, pt does try to consume 3 meals a day, however noticed meals have been very small portions. Weight has been stable however. Pt reports she has been consuming Ensure at home on occasion. She has reported she has tried the [employer] while admitted and reports it is "too rich tasting" for her and will not drink it. RD to order Boost instead. Pt was encouraged to eat her food at meals.  Nutrition-Focused physical exam completed. Findings are moderate fat depletion, severe muscle depletion, and mild edema.   Labs and medications reviewed.   Diet Order:  Diet regular Room service appropriate?: Yes; Fluid consistency:: Thin  Skin:  Wound (see comment) (R ankle wound)  Last BM:  11/9  Height:   Ht Readings from Last 1 Encounters:  02/15/15 '5\' 5"'$  (1.651 m)    Weight:   Wt Readings from Last 1 Encounters:  02/15/15 116  lb 2.9 oz (52.7 kg)    Ideal Body Weight:  56.8 kg  BMI:  Body mass index is 19.33 kg/(m^2).  Estimated Nutritional Needs:   Kcal:  1600-1800  Protein:  70-80 grams  Fluid:  1.6 - 1.8 L/day  EDUCATION NEEDS:   Education needs addressed  Corrin Parker, MS, RD, LDN Pager # [phone] After hours/ weekend pager # [phone]

## 2015-02-19 NOTE — Consult Note (Addendum)
CONSULTATION NOTE  Reason for Consult: GIB, anticoagulation management  Requesting Physician: Dr. Isidoro Donning  Cardiologist: Dr. Eden Emms  HPI: This is a 74 y.o. female with a past cardiac history significant for paroxysmal atrial fibrillation on warfarin, hypertension, dyslipidemia and peripheral arterial disease with right subclavian steal syndrome and a carotid bruit - there is associated 40-59 % left internal carotid artery stenosis. She has seen Dr. Myra Gianotti with vascular surgery and had an occluded right innominate and had failed prior stenting attempts. She had a previous episode of rapid atrial fibrillation and this may have contributed to some loss of consciousness. Apparently she also has a chronic cavitary lung lesion and when she was last seen in the office had lost about 15 pounds and appeared weak. She is on amiodarone therapy 200 mg daily and is had elevated liver enzymes with an AST of 50 and ALT of 120 recently.  She is now admitted with hematochezia and recent findings of anemia with a hemoglobin of 8.3. She reports generalized weakness and fatigue as well as dark stools and bright red blood per rectum. Evaluation revealed multiple gastric ulcers. On admission, she was on triple therapy with aspirin, Plavix and warfarin. INR on admission was greater than 10. White blood cell count on admission was markedly elevated at 41,000 and she is again noted to have a right lung cavitary lesion on chest x-ray. Her leukocytosis appears to be improving. She was transfused 4 units of packed red blood cells and 5 units of FFP. Her INR is now subtherapeutic. Anticoagulation is being held. Cardiology is consult for management recommendations regarding anticoagulation and antiplatelet therapy.  PMHx:  Past Medical History  Diagnosis Date  . Subclavian steal syndrome   . Carotid bruit     LICA 60-79% (duplex 9/11)  . Hypertension   . Hyperlipidemia   . Atrial fibrillation (HCC)     x 3 yrs  .  Meningioma (HCC)   . Osteoporosis   . Stroke [facility])     CVA 2008  . Pneumonia May 19, 2014  . Breast cancer (HCC)     S/P left mastectomy and chemotherapy 1989 remained in remission  . Anemia 05/2014.  . Carotid artery occlusion   . Ulcers of both lower extremities (HCC) 2015  . Degenerative arthritis of spine 2015    Chronic back pain   Past Surgical History  Procedure Laterality Date  . Cardiac catheterization    . Mastectomy Left 1990  . Cataract extraction Bilateral 2013  . Colonoscopy  11/17/2010    Procedure: COLONOSCOPY;  Surgeon: Malissa Hippo, MD;  Location: [facility];  Service: Endoscopy;  Laterality: N/A;  10:45 am  . Esophagogastroduodenoscopy  11/17/2010    Procedure: ESOPHAGOGASTRODUODENOSCOPY (EGD);  Surgeon: Malissa Hippo, MD;  Location: [facility];  Service: Endoscopy;  Laterality: N/A;  . Lumbar epidural injection  06-2012--06-2013    pt. states she has had 5 epidurals in 06-2012----06-2013  . Eye surgery    . Exploratory laparotomy  1960s    For peritonitis of undetermined cause  . Esophagogastroduodenoscopy N/A 02/16/2015    Procedure: ESOPHAGOGASTRODUODENOSCOPY (EGD);  Surgeon: Ruffin Frederick, MD;  Location: [facility] ENDOSCOPY;  Service: Gastroenterology;  Laterality: N/A;    FAMHx: Family History  Problem Relation Age of Onset  . Alzheimer's disease Mother   . Dementia Mother   . Hypertension Mother   . Hyperlipidemia Mother   . Parkinsonism Father     SOCHx:  reports that she quit  smoking about 8 years ago. Her smoking use included Cigarettes. She has a 56 pack-year smoking history. She has never used smokeless tobacco. She reports that she drinks alcohol. She reports that she does not use illicit drugs.  ALLERGIES: Allergies  Allergen Reactions  . Iodine Rash and Other (See Comments)    BETADINE Rash/burning, blisters on skin.  . Iohexol Rash and Other (See Comments)    Blisters; PT NEEDS 13-HOUR PREP   . Tape Itching and Rash      Prefers PAPER TAPE    ROS: A comprehensive review of systems was negative except for: Musculoskeletal: positive for back pain  HOME MEDICATIONS:   Medication List    ASK your doctor about these medications        albuterol 108 (90 BASE) MCG/ACT inhaler  Commonly known as:  PROVENTIL HFA;VENTOLIN HFA  Inhale 1-2 puffs into the lungs every 6 (six) hours as needed for wheezing or shortness of breath.     alendronate 70 MG tablet  Commonly known as:  FOSAMAX  Take 1 tablet (70 mg total) by mouth every Sunday. Take in the morning with a full glass of water, on empty stomach     amiodarone 200 MG tablet  Commonly known as:  PACERONE  Take 1 tablet by mouth  daily     amLODipine 10 MG tablet  Commonly known as:  NORVASC  Take 10 mg by mouth every evening.     aspirin EC 81 MG tablet  Take 81 mg by mouth every morning.     calcium-vitamin D 250-125 MG-UNIT tablet  Commonly known as:  OSCAL  Take 1 tablet by mouth 2 (two) times daily.     clopidogrel 75 MG tablet  Commonly known as:  PLAVIX  Take 1 tablet by mouth  every morning     colesevelam 625 MG tablet  Commonly known as:  WELCHOL  Take 3 tablets by mouth  twice a day with a meal     ferrous sulfate 325 (65 FE) MG tablet  Take 325 mg by mouth daily with breakfast.     Fish Oil 1200 MG Caps  Take 1,200 mg by mouth 2 (two) times daily.     FUSION PLUS Caps  Take 1 capsule by mouth every morning.     HYDROcodone-acetaminophen 5-325 MG tablet  Commonly known as:  NORCO/VICODIN  Take 1 tablet by mouth every 6 (six) hours as needed for moderate pain.     irbesartan 300 MG tablet  Commonly known as:  AVAPRO  Take 300 mg by mouth every evening.     magnesium oxide 400 MG tablet  Commonly known as:  MAG-OX  Take 400 mg by mouth every morning.     meloxicam 15 MG tablet  Commonly known as:  MOBIC  Take 15 mg by mouth every morning.     metoprolol tartrate 25 MG tablet  Commonly known as:  LOPRESSOR  Take  75 mg (three) tablets by mouth twice a day     MOVANTIK 25 MG Tabs tablet  Generic drug:  naloxegol oxalate  Take 25 mg by mouth daily as needed.     multivitamin with minerals Tabs tablet  Take 1 tablet by mouth every morning.     oxyCODONE-acetaminophen 7.5-325 MG tablet  Commonly known as:  PERCOCET  Take 0.5 tablets by mouth at bedtime as needed. For pain     pravastatin 40 MG tablet  Commonly known as:  PRAVACHOL  Take 1  tablet by mouth  every night at bedtime     SANTYL ointment  Generic drug:  collagenase  Apply 1 application topically daily. Applied to sore on ankle     vitamin C 500 MG tablet  Commonly known as:  ASCORBIC ACID  Take 500 mg by mouth every morning.     warfarin 4 MG tablet  Commonly known as:  COUMADIN  Take $Rem'4mg'iwRE$  daily except $RemoveBefo'2mg'JIInTmDrShT$  on Sundays and Thursdays        HOSPITAL MEDICATIONS: I have reviewed the patient's current medications.  VITALS: Blood pressure 146/75, pulse 76, temperature 97.7 F (36.5 C), temperature source Oral, resp. rate 19, height $RemoveBe'5\' 5"'RkmkjPgJk$  (1.651 m), weight 116 lb 2.9 oz (52.7 kg), SpO2 95 %.  PHYSICAL EXAM: General appearance: alert, appears older than stated age, cachectic and no distress Neck: no carotid bruit and no JVD Lungs: diminished breath sounds RLL Heart: regular rate and rhythm Abdomen: soft, non-tender; bowel sounds normal; no masses,  no organomegaly and scaphoid Extremities: extremities normal, atraumatic, no cyanosis or edema Pulses: 2+ and symmetric Skin: extensive arm ecchymosis Neurologic: Grossly normal Psych: Pleasant  LABS: Results for orders placed or performed during the hospital encounter of 02/14/15 (from the past 48 hour(s))  H. pylori antibody, IgG     Status: None   Collection Time: 02/17/15 11:23 PM  Result Value Ref Range   H Pylori IgG <0.9 0.0 - 0.8 U/mL    Comment: (NOTE)                             Negative            <0.9                             Indeterminate  0.9 - 1.0                              Positive            >1.0 Performed At: [facility] [place], [place] [id number] Lindon Romp MD [id number]   CBC     Status: Abnormal   Collection Time: 02/18/15  2:48 PM  Result Value Ref Range   WBC 22.4 (H) 4.0 - 10.5 K/uL   RBC 4.09 3.87 - 5.11 MIL/uL   Hemoglobin 12.4 12.0 - 15.0 g/dL   HCT 36.5 36.0 - 46.0 %   MCV 89.2 78.0 - 100.0 fL   MCH 30.3 26.0 - 34.0 pg   MCHC 34.0 30.0 - 36.0 g/dL   RDW 15.6 (H) 11.5 - 15.5 %   Platelets 551 (H) 150 - 400 K/uL  CBC     Status: Abnormal   Collection Time: 02/19/15  5:40 AM  Result Value Ref Range   WBC 21.4 (H) 4.0 - 10.5 K/uL   RBC 3.95 3.87 - 5.11 MIL/uL   Hemoglobin 11.9 (L) 12.0 - 15.0 g/dL   HCT 35.5 (L) 36.0 - 46.0 %   MCV 89.9 78.0 - 100.0 fL   MCH 30.1 26.0 - 34.0 pg   MCHC 33.5 30.0 - 36.0 g/dL   RDW 15.3 11.5 - 15.5 %   Platelets 609 (H) 150 - 400 K/uL  Comprehensive metabolic panel     Status: Abnormal   Collection Time: 02/19/15  5:40 AM  Result Value Ref Range  Sodium 134 (L) 135 - 145 mmol/L   Potassium 4.0 3.5 - 5.1 mmol/L   Chloride 99 (L) 101 - 111 mmol/L   CO2 25 22 - 32 mmol/L   Glucose, Bld 71 65 - 99 mg/dL   BUN 6 6 - 20 mg/dL   Creatinine, Ser 0.61 0.44 - 1.00 mg/dL   Calcium 8.4 (L) 8.9 - 10.3 mg/dL   Total Protein 5.9 (L) 6.5 - 8.1 g/dL   Albumin 1.6 (L) 3.5 - 5.0 g/dL   AST 18 15 - 41 U/L   ALT 17 14 - 54 U/L   Alkaline Phosphatase 111 38 - 126 U/L   Total Bilirubin 0.9 0.3 - 1.2 mg/dL   GFR calc non Af Amer >60 >60 mL/min   GFR calc Af Amer >60 >60 mL/min    Comment: (NOTE) The eGFR has been calculated using the CKD EPI equation. This calculation has not been validated in all clinical situations. eGFR's persistently <60 mL/min signify possible Chronic Kidney Disease.    Anion gap 10 5 - 15  Vancomycin, trough     Status: Abnormal   Collection Time: 02/19/15  5:40 AM  Result Value Ref Range   Vancomycin Tr 8 (L) 10.0 - 20.0 ug/mL     IMAGING: No results found.  HOSPITAL DIAGNOSES: Active Problems:   Hyperlipemia   Essential hypertension   Atrial fibrillation (HCC)   Thrombocytosis (HCC)   Protein-calorie malnutrition, severe (HCC)   GI bleed   Carotid artery disease (HCC)   Leukocytosis   Acute GI bleeding   CAD in native artery   Supratherapeutic INR   Right lower lobe lung mass   Cavitary lesion of lung   Acute renal failure (HCC)   SIRS (systemic inflammatory response syndrome) (HCC)   Chronic back pain   Multiple gastric ulcers   Coagulopathy (HCC)   Bleeding gastrointestinal   IMPRESSION: 1. Acute UGIB (with gastric ulcers), worsened by warfarin coagulopathy (INR>10) 2. PAD and mild CAD on dual antiplatelet therapy 3. Paroxysmal atrial fibrillation on amiodarone - CHADSVASC or 5, HAS-BLED score of 5  RECOMMENDATION: 1. [name] had acute UGI bleeding which was exacerbated by NSAID use and warfarin coagulopathy.. This may been due to dietary deficiency, weight loss or other factors. Certainly her bleeding risk is very high on triple therapy. Her annual risk of stroke is 10%, this is reduced to 5.5% on aspirin and Plavix alone and would be 3.3% on triple therapy, however the risk of major bleeding would be 3.8%. If we considered an alternative agent such as Eliquis, stroke risk would be reduced to 2.4% with similar adverse bleeding risk. A direct oral anticoagulant may be slightly safer in her given the labile INR and recent weight change with antibiotic use. The concern would be the inability to easily reverse this medication if she were to have recurrent GI bleeding.  I discussed the options with her and we have agreed to switch her to Eliquis 5 mg po BID (weight <60 kg, but not >80 yrs old and creatinine is <1.5). I would recommend discontinuing aspirin and restarting plavix 75 mg daily on discharge for CAD/PAD. Follow-up with [name] after discharge. Thanks for the consultation. Call with  questions.  Time Spent Directly with Patient: 35 minutes  Pixie Casino, MD, [facility] Attending Cardiologist [place] 02/19/2015, 11:08 AM

## 2015-02-19 NOTE — Evaluation (Signed)
Physical Therapy Evaluation Patient Details Name: [name] MRN: [id number] DOB: April 20, 1940 Today's Date: 02/19/2015   History of Present Illness  74 y.o. F Hx HTN, CAD, Atrial Fibrillation on warfarin, Subclavian Steal Syndrome, Carotid Artery Occlusion, Breast Cancer S/P left mastectomy, Meningioma, HLD, and a chronic right ankle venous stasis ulcer who presented with complaints of hematochezia. She reports spontaneous T10 fx while turning in bed 3 weeks ago.  Clinical Impression  Patient reports that today was best she has walked in some time and she expressed that she was proud of herself.  Therapy recommends RW for mobility, fall prevention and pain management, however, patient politely declines RW as she feels she was getting by fine by holding to back of w/c at home prior to admission.  Will continue to follow patient while on this venue of care to progress mobility. Patient requests OPPT as she was participating with them earlier this year and would like to have services set up in [place].     Follow Up Recommendations Outpatient PT;Supervision for mobility/OOB    Equipment Recommendations  None recommended by PT    Recommendations for Other Services       Precautions / Restrictions Precautions Precautions: Back;Fall (back prec to assist with pain management, not prescribed) Precaution Booklet Issued: No Precaution Comments: advised pt on back precautions to manage pain although MD did not prescribe Restrictions Weight Bearing Restrictions: No Other Position/Activity Restrictions: contact and airborne precautions, therefore limited tasks to within room      Mobility  Bed Mobility Overal bed mobility: Modified Independent             General bed mobility comments: using rail and raised HOB (instructed in back prec for bed mobility to manage pain)  Transfers Overall transfer level: Needs assistance Equipment used: 1 person hand held assist Transfers: Sit  to/from Stand Sit to Stand: Min guard         General transfer comment: min cues for hand placement  Ambulation/Gait Ambulation/Gait assistance: Min assist Ambulation Distance (Feet): 12 Feet Assistive device: 1 person hand held assist Gait Pattern/deviations: Shuffle;Trunk flexed     General Gait Details: limited by fatigue  Stairs            Wheelchair Mobility    Modified Rankin (Stroke Patients Only)       Balance Overall balance assessment: Needs assistance Sitting-balance support: No upper extremity supported;Feet supported Sitting balance-Leahy Scale: Fair     Standing balance support: Single extremity supported Standing balance-Leahy Scale: Poor                               Pertinent Vitals/Pain Pain Assessment: 0-10 Pain Score: 6  Pain Location: low back Pain Descriptors / Indicators: Constant;Aching;Sore Pain Intervention(s): Limited activity within patient's tolerance;Monitored during session    [place] expects to be discharged to:: Private residence Living Arrangements: Spouse/significant other Available Help at Discharge: Family;Friend(s);Available 24 hours/day (friends will assist when husband not present) Type of Home: House Home Access: Stairs to enter Entrance Stairs-Rails: Right Entrance Stairs-Number of Steps: 4 Home Layout: One level Home Equipment: Transport chair;Cane - single point;Grab bars - tub/shower;Shower seat Additional Comments: ambulated by holding to back of w/c prior to admission    Prior Function Level of Independence: Independent with assistive device(s)         Comments: using w/c as assistive device     Hand Dominance   Dominant Hand:  Right    Extremity/Trunk Assessment   Upper Extremity Assessment: Overall WFL for tasks assessed           Lower Extremity Assessment: Generalized weakness      Cervical / Trunk Assessment: Kyphotic  Communication    Communication: No difficulties  Cognition Arousal/Alertness: Awake/alert Behavior During Therapy: WFL for tasks assessed/performed Overall Cognitive Status: Within Functional Limits for tasks assessed                      General Comments General comments (skin integrity, edema, etc.): non productive cough during session    Exercises        Assessment/Plan    PT Assessment Patient needs continued PT services  PT Diagnosis Difficulty walking;Generalized weakness;Acute pain   PT Problem List Decreased strength;Decreased activity tolerance;Decreased balance;Decreased mobility;Decreased knowledge of use of DME;Decreased safety awareness;Decreased knowledge of precautions;Pain  PT Treatment Interventions DME instruction;Gait training;Stair training;Functional mobility training;Therapeutic activities;Therapeutic exercise;Balance training;Patient/family education   PT Goals (Current goals can be found in the Care Plan section) Acute Rehab PT Goals Patient Stated Goal: return to outpatient therapy in [place] PT Goal Formulation: With patient Time For Goal Achievement: 02/26/15 Potential to Achieve Goals: Good    Frequency Min 3X/week   Barriers to discharge Decreased caregiver support unsure how much assist husband can provide    Co-evaluation               End of Session Equipment Utilized During Treatment: Gait belt Activity Tolerance: Patient limited by fatigue;Patient tolerated treatment well Patient left: in chair;with call bell/phone within reach Nurse Communication: Mobility status         Time: 0912-0950 PT Time Calculation (min) (ACUTE ONLY): 38 min   Charges:   PT Evaluation $Initial PT Evaluation Tier I: 1 Procedure PT Treatments $Gait Training: 8-22 mins   PT G CodesMalka So, PT [phone]  [place] 02/19/2015, 11:28 AM

## 2015-02-20 LAB — CBC
HEMATOCRIT: 36.6 % (ref 36.0–46.0)
HEMOGLOBIN: 12 g/dL (ref 12.0–15.0)
MCH: 29.5 pg (ref 26.0–34.0)
MCHC: 32.8 g/dL (ref 30.0–36.0)
MCV: 89.9 fL (ref 78.0–100.0)
Platelets: 698 10*3/uL — ABNORMAL HIGH (ref 150–400)
RBC: 4.07 MIL/uL (ref 3.87–5.11)
RDW: 15.2 % (ref 11.5–15.5)
WBC: 20.8 10*3/uL — AB (ref 4.0–10.5)

## 2015-02-20 LAB — BASIC METABOLIC PANEL
ANION GAP: 10 (ref 5–15)
BUN: 8 mg/dL (ref 6–20)
CHLORIDE: 97 mmol/L — AB (ref 101–111)
CO2: 24 mmol/L (ref 22–32)
Calcium: 8.6 mg/dL — ABNORMAL LOW (ref 8.9–10.3)
Creatinine, Ser: 0.68 mg/dL (ref 0.44–1.00)
GFR calc Af Amer: >60 mL/min (ref >60)
GFR calc non Af Amer: >60 mL/min (ref >60)
GLUCOSE: 74 mg/dL (ref 65–99)
POTASSIUM: 3.8 mmol/L (ref 3.5–5.1)
Sodium: 131 mmol/L — ABNORMAL LOW (ref 135–145)

## 2015-02-20 MED ORDER — AMOXICILLIN-POT CLAVULANATE 875-125 MG PO TABS: 1.0000 | ORAL_TABLET | Freq: Two times a day (BID) | ORAL | Status: DC

## 2015-02-20 MED ORDER — APIXABAN 5 MG PO TABS: 5.0000 mg | ORAL_TABLET | Freq: Two times a day (BID) | ORAL | Status: DC

## 2015-02-20 MED ORDER — BOOST PLUS PO LIQD: 237.0000 mL | Freq: Three times a day (TID) | ORAL | Status: DC

## 2015-02-20 MED ORDER — PANTOPRAZOLE SODIUM 40 MG PO TBEC: 40.0000 mg | DELAYED_RELEASE_TABLET | Freq: Two times a day (BID) | ORAL | Status: DC

## 2015-02-20 MED ORDER — HYDROCODONE-ACETAMINOPHEN 5-325 MG PO TABS: 1.0000 | ORAL_TABLET | Freq: Four times a day (QID) | ORAL | Status: DC | PRN

## 2015-02-20 NOTE — Discharge Summary (Signed)
Physician Discharge Summary   Patient ID: [name] MRN: [id number] DOB/AGE: 1940-10-23 74 y.o.  Admit date: 02/14/2015 Discharge date: 02/20/2015  Primary Care Physician:  Wende Neighbors, MD  Discharge Diagnoses:    . GI bleed . Multiple gastric ulcers . Coagulopathy ([place]) with a supratherapeutic INR  . Hyperlipemia . Essential hypertension . Atrial fibrillation ([place]) . Thrombocytosis ([place]) . Protein-calorie malnutrition, severe ([place]) . Carotid artery disease ([place]) . Leukocytosis . CAD in native artery . Right lower lobe lung mass . Cavitary lesion of lung . Acute renal failure ([place]) . SIRS (systemic inflammatory response syndrome) ([facility]) . Chronic back pain   Consults:   Gastroenterology  Pulmonology  Cardiology   Recommendations for Outpatient Follow-up:  Please note aspirin and Coumadin have been discontinued.  Per Cardiology, Dr. Debara Pickett, patient was placed on Eliquis 5 mg twice a day, Plavix 75 mg daily  Please follow CBC, BMET  Patient placed on Protonix 40 mg twice a day  Repeat CT of the chest in 8-12 weeks, follow up outpatient with pulmonology, Dr. Chase Caller  TESTS THAT NEED FOLLOW-UP CBC, BMET   DIET: Heart healthy diet  Allergies:   Allergies  Allergen Reactions  . Iodine Rash and Other (See Comments)    BETADINE Rash/burning, blisters on skin.  . Iohexol Rash and Other (See Comments)    Blisters; PT NEEDS 13-HOUR PREP   . Tape Itching and Rash    Prefers PAPER TAPE     Discharge Medications:   Medication List    STOP taking these medications        aspirin EC 81 MG tablet     FUSION PLUS Caps     meloxicam 15 MG tablet  Commonly known as:  MOBIC     warfarin 4 MG tablet  Commonly known as:  COUMADIN      TAKE these medications        albuterol 108 (90 BASE) MCG/ACT inhaler  Commonly known as:  PROVENTIL HFA;VENTOLIN HFA  Inhale 1-2 puffs into the lungs every 6 (six) hours as needed for wheezing or shortness of breath.      alendronate 70 MG tablet  Commonly known as:  FOSAMAX  Take 1 tablet (70 mg total) by mouth every Sunday. Take in the morning with a full glass of water, on empty stomach     amiodarone 200 MG tablet  Commonly known as:  PACERONE  Take 1 tablet by mouth  daily     amLODipine 10 MG tablet  Commonly known as:  NORVASC  Take 10 mg by mouth every evening.     amoxicillin-clavulanate 875-125 MG tablet  Commonly known as:  AUGMENTIN  Take 1 tablet by mouth 2 (two) times daily. X 7days     apixaban 5 MG Tabs tablet  Commonly known as:  ELIQUIS  Take 1 tablet (5 mg total) by mouth 2 (two) times daily.     calcium-vitamin D 250-125 MG-UNIT tablet  Commonly known as:  OSCAL  Take 1 tablet by mouth 2 (two) times daily.     clopidogrel 75 MG tablet  Commonly known as:  PLAVIX  Take 1 tablet by mouth  every morning     colesevelam 625 MG tablet  Commonly known as:  WELCHOL  Take 3 tablets by mouth  twice a day with a meal     ferrous sulfate 325 (65 FE) MG tablet  Take 325 mg by mouth daily with breakfast.     Fish Oil 1200  MG Caps  Take 1,200 mg by mouth 2 (two) times daily.     HYDROcodone-acetaminophen 5-325 MG tablet  Commonly known as:  NORCO/VICODIN  Take 1 tablet by mouth every 6 (six) hours as needed for moderate pain.     irbesartan 300 MG tablet  Commonly known as:  AVAPRO  Take 300 mg by mouth every evening.     lactose free nutrition Liqd  Take 237 mLs by mouth 3 (three) times daily between meals. Over the counter     magnesium oxide 400 MG tablet  Commonly known as:  MAG-OX  Take 400 mg by mouth every morning.     metoprolol tartrate 25 MG tablet  Commonly known as:  LOPRESSOR  Take 75 mg (three) tablets by mouth twice a day     MOVANTIK 25 MG Tabs tablet  Generic drug:  naloxegol oxalate  Take 25 mg by mouth daily as needed.     multivitamin with minerals Tabs tablet  Take 1 tablet by mouth every morning.     oxyCODONE-acetaminophen 7.5-325 MG  tablet  Commonly known as:  PERCOCET  Take 0.5 tablets by mouth at bedtime as needed. For pain     pantoprazole 40 MG tablet  Commonly known as:  PROTONIX  Take 1 tablet (40 mg total) by mouth 2 (two) times daily.     pravastatin 40 MG tablet  Commonly known as:  PRAVACHOL  Take 1 tablet by mouth  every night at bedtime     SANTYL ointment  Generic drug:  collagenase  Apply 1 application topically daily. Applied to sore on ankle     vitamin C 500 MG tablet  Commonly known as:  ASCORBIC ACID  Take 500 mg by mouth every morning.         Brief H and P: For complete details please refer to admission H and P, but in brief 74 y.o. F Hx HTN, CAD, Atrial Fibrillation on warfarin, Subclavian Steal Syndrome, Carotid Artery Occlusion, Breast Cancer S/P left mastectomy, Meningioma, HLD, and a chronic right ankle venous stasis ulcer who presented with complaints of hematochezia. She had blood work done by her PCP on November 4, and when her hemoglobin came back at 8.3 she was told to come to the ED for further evaluation. Patient reported generalized weakness and fatigue over a few days, dark-colored stools for a few days, and then bright red blood per rectum over 24 hours. Patient is on aspirin and Plavix for carotid artery disease, as well as warfarin for A. Fib. In the ED her INR was >10, Hgb 6.2. CXR revealed a cavitary R lung lesion.   Hospital Course:  Acute GI bleed: Patient presented with hemoglobin of 6.2, INR above 10 at the time of admission. - Patient was admitted to stepdown unit, transfused 4 units packed RBCs and 5 units of FFP through hospitalization. - Patient was on aspirin, Plavix, warfarin prior to admission. - Gastroenterology was consulted. Patient underwent endoscopy on 11/9 which showed normal esophagus, 5cm HH, gastritis w/ numerous clean based linear ulcers of stomach, and a few clean based ulcers in the duodenal bulb -Continue Protonix twice a day as recommended by  gastroenterology, avoid NSAIDs. Meloxicam discontinued. -H pylori screen negative   Acute blood loss anemia - Patient presented with hemoglobin of 6.2, received 4 units packed RBCs, no improved to 12.0 at the time of discharge H&H stable now  SIRS - Hemorrhagic shock  -Resolved  Supratherapeutic INR / warfarin induced coagulopathy -Patient  has been given vitamin K and FFP, INR now corrected, possibly due to poor oral intake for the last 2 weeks  Atrial fibrillation - Currently normal sinus rhythm, continue amiodarone and metoprolol.  - Warfarin discontinued. Per prior cardiology notes, her primary cardiologist [name], had offered her to transition her to low-dose eliquis however patient at that time wanted to remain on Coumadin. Cardiology was consulted to streamline her blood thinners and anticoagulation as triple therapy raises her bleeding risk significantly. - Per cardiology recommendations, Dr. Debara Pickett, who discussed with the patient, placed her on eliquis 5 mg twice a day and Plavix 75 mg daily for CAD/PAD.  Essential HTN  - stable   CAD native artery - follow cardiology recommendations regarding antiplatelet & anticoagulation therapy   RLL lung mass / cavitary lesion - 9.5 x 6.7cm - cavitary PNA , aspiration versus postobstructive. Per pulmonology, not TB. -previously noted (Feb 2016), resolved (June 2016), and has now recurred  -Quantiferon gold indeterminate, sputum AFB negative  -Influenza panel/respiratory virus panel negative  - PCCM has signed off, recommended no bronchoscopy, completion of the treatment course as HCAP, repeat CT in 8-12 weeks, follow-up outpatient with Dr. Chase Caller. Patient will continue Augmentin for 7 more days.    Acute renal failure -due to volume depletion - resolved   Thrombocytosis  -chronic - at baseline  Chronic lower back pain -CT of the lumbar spine on 10/24 had shown remote T12 compression fracture with bony retropulsion,  moderately severe central canal stenosis at and just below T11 and 12 level  - Continue pain control for now, outpatient follow-up with neurosurgery per PCP referral  Right ankle venous stasis ulcer - patient does not want debriding, continue Santyl and dressing changesdaily per wound care recommendations  Severe protein calorie malnutrition - Patient was recommended nutritional supplements  Day of Discharge BP 142/80 mmHg  Pulse 76  Temp(Src) 98.1 F (36.7 C) (Oral)  Resp 18  Ht '5\' 5"'$  (1.651 m)  Wt 52.7 kg (116 lb 2.9 oz)  BMI 19.33 kg/m2  SpO2 98%  Physical Exam: General: Alert and awake oriented x3 not in any acute distress. HEENT: anicteric sclera, pupils reactive to light and accommodation CVS: S1-S2 clear no murmur rubs or gallops Chest: clear to auscultation bilaterally, no wheezing rales or rhonchi Abdomen: soft nontender, nondistended, normal bowel sounds Extremities: no cyanosis, clubbing or edema noted bilaterally Neuro: Cranial nerves II-XII intact, no focal neurological deficits    The results of significant diagnostics from this hospitalization (including imaging, microbiology, ancillary and laboratory) are listed below for reference.    LAB RESULTS: Basic Metabolic Panel:  Recent Labs Lab 02/19/15 0540 02/20/15 0418  NA 134* 131*  K 4.0 3.8  CL 99* 97*  CO2 25 24  GLUCOSE 71 74  BUN 6 8  CREATININE 0.61 0.68  CALCIUM 8.4* 8.6*   Liver Function Tests:  Recent Labs Lab 02/17/15 0318 02/19/15 0540  AST 20 18  ALT 17 17  ALKPHOS 102 111  BILITOT 0.9 0.9  PROT 5.4* 5.9*  ALBUMIN 1.6* 1.6*    Recent Labs Lab 02/14/15 2005  LIPASE 13   No results for input(s): AMMONIA in the last 168 hours. CBC:  Recent Labs Lab 02/14/15 2005  02/19/15 0540 02/20/15 0418  WBC 41.3*  < > 21.4* 20.8*  NEUTROABS 36.5*  --   --   --   HGB 6.2*  < > 11.9* 12.0  HCT 18.5*  < > 35.5* 36.6  MCV 89.8  < >  89.9 89.9  PLT 622*  < > 609* 698*  < > =  values in this interval not displayed. Cardiac Enzymes: No results for input(s): CKTOTAL, CKMB, CKMBINDEX, TROPONINI in the last 168 hours. BNP: Invalid input(s): POCBNP CBG: No results for input(s): GLUCAP in the last 168 hours.  Significant Diagnostic Studies:  Ct Chest Wo Contrast  02/15/2015  CLINICAL DATA:  Right lower lobe lung mass. EXAM: CT CHEST WITHOUT CONTRAST TECHNIQUE: Multidetector CT imaging of the chest was performed following the standard protocol without IV contrast. COMPARISON:  Multiple chest CTs including 05/20/2014, 07/08/2014, 09/08/2014. FINDINGS: Large rounded masslike area of consolidation noted in the right lower lobe, measuring 9.5 x 6.7 cm. There is adjacent posterior right lower lobe airspace consolidation and small right pleural effusion. This is in an area of similar rounded opacification in February which resolved in June. I suspect this represents an area of rounded pneumonia. There are small areas of central cavitation. 5 mm nodule in the left lower lobe on image 39 is stable. No confluent opacity on the left. Mildly enlarged precarinal lymph node has a short axis diameter of 16 mm compared with 10 mm previously. No axillary adenopathy. No definite visible hilar adenopathy although evaluation is difficult without intravenous contrast. Chest wall soft tissues are unremarkable aortic and coronary artery calcifications noted. Heart is normal size. Aorta is normal caliber. Imaging into the upper abdomen shows no acute findings. Severe compression fracture at T12 is stable. New moderate to severe compression fracture at T10. IMPRESSION: Recurrent to rounded masslike area of consolidation in the right lower lobe. Given that this area was present previously then resolved and has recurrent quickly, this is most compatible with rounded pneumonia. Small right pleural effusion with adjacent right lower lobe atelectasis or consolidation. Mildly enlarged precarinal lymph node, likely  reactive. Coronary artery and aortic atherosclerosis. Electronically Signed   By: Rolm Baptise M.D.   On: 02/15/2015 16:31   Dg Chest Port 1 View  02/14/2015  CLINICAL DATA:  GI bleeding EXAM: PORTABLE CHEST 1 VIEW COMPARISON:  05/20/2014 FINDINGS: There is a dense opacity at the right base with rounded masslike appearance. Findings are similar to abnormality seen on 05/20/2014 chest CT. The left lung is well aerated. Normal heart size and stable aortic and hilar contours given technique. Changes of left mastectomy and axillary dissection. IMPRESSION: Right basilar consolidation with rounded masslike area. Appearance very similar to abnormality seen 05/20/2014, which largely resolved on 09/08/2014 CT. Recurrent pneumonia or inflammatory cavity are primary considerations given rapid recurrence. Recommend chest CT. Electronically Signed   By: Monte Fantasia M.D.   On: 02/14/2015 21:46    2D ECHO:   Disposition and Follow-up: Discharge Instructions    Diet - low sodium heart healthy    Complete by:  As directed      Discharge instructions    Complete by:  As directed   Please note that Aspirin and coumadin has been stopped. You are now started on eliquis and plavix only.     Increase activity slowly    Complete by:  As directed             DISPOSITION: Home with home health PT, OT   DISCHARGE FOLLOW-UP Follow-up Information    Follow up with REHMAN,NAJEEB U, MD. Schedule an appointment as soon as possible for a visit in 2 weeks.   Specialty:  Gastroenterology   Why:  for hospital follow-up   Contact information:   [address],  [address] [place] [address] [phone]       Follow up with Wende Neighbors, MD. Schedule an appointment as soon as possible for a visit in 2 weeks.   Specialty:  Internal Medicine   Why:  for hospital follow-up   Contact information:   [place] [address] [phone]       Follow up with Jenkins Rouge, MD. Schedule an appointment  as soon as possible for a visit in 2 weeks.   Specialty:  Cardiology   Why:  for hospital follow-up   Contact information:   [address] [place] [address] [phone]       Follow up with [facility], MD. Schedule an appointment as soon as possible for a visit in 3 weeks.   Specialty:  Pulmonary Disease   Why:  for hospital follow-up   Contact information:   [place] [address] [phone]        Time spent on Discharge: 35 mins   Signed:   RAI,RIPUDEEP M.D. [facility] 02/20/2015, 10:35 AM Pager: [phone]

## 2015-02-20 NOTE — Care Management Note (Signed)
Case Management Note  Patient Details  Name: [name] MRN: [id number] Date of Birth: [birth year]  Subjective/Objective:                  Marland Kitchen GI bleed  Action/Plan:  Discharge planning Expected Discharge Date:   02/20/15               Expected Discharge Plan:  [place]  In-House Referral:     Discharge planning Services  CM Consult  Post Acute Care Choice:    Choice offered to:  Patient, Spouse  DME Arranged:  N/A DME Agency:  NA  HH Arranged:  RN, PT, OT, Nurse's Aide [place] Agency:  [place] [facility]  Status of Service:  Completed, signed off  Medicare Important Message Given:  Yes Date Medicare IM Given:    Medicare IM give by:    Date Additional Medicare IM Given:    Additional Medicare Important Message give by:     If discussed at [place] of Stay Meetings, dates discussed:    Additional Comments: CM met with pt in room to offer choice of home health agency.  Pt choses [facility] to render HHPT/OT/RN/aide.  Pt states she has a wheelchair at home and does not walk.  Pt refuses 3n1.  Address and contact information verified by pt.  Referral called to [facility] rep, tiffany.  No other CM needs were communicated. Dellie Catholic, RN 02/20/2015, 11:21 AM

## 2015-02-21 ENCOUNTER — Telehealth: Payer: Self-pay | Admitting: Cardiovascular Disease

## 2015-02-21 DIAGNOSIS — D649 Anemia, unspecified: Secondary | ICD-10-CM | POA: Diagnosis not present

## 2015-02-21 DIAGNOSIS — S81802D Unspecified open wound, left lower leg, subsequent encounter: Secondary | ICD-10-CM | POA: Diagnosis not present

## 2015-02-21 DIAGNOSIS — J189 Pneumonia, unspecified organism: Secondary | ICD-10-CM | POA: Diagnosis not present

## 2015-02-21 DIAGNOSIS — Z7901 Long term (current) use of anticoagulants: Secondary | ICD-10-CM | POA: Diagnosis not present

## 2015-02-21 DIAGNOSIS — K259 Gastric ulcer, unspecified as acute or chronic, without hemorrhage or perforation: Secondary | ICD-10-CM | POA: Diagnosis not present

## 2015-02-21 DIAGNOSIS — Z7902 Long term (current) use of antithrombotics/antiplatelets: Secondary | ICD-10-CM | POA: Diagnosis not present

## 2015-02-21 DIAGNOSIS — Z87891 Personal history of nicotine dependence: Secondary | ICD-10-CM | POA: Diagnosis not present

## 2015-02-21 DIAGNOSIS — Z8673 Personal history of transient ischemic attack (TIA), and cerebral infarction without residual deficits: Secondary | ICD-10-CM | POA: Diagnosis not present

## 2015-02-21 DIAGNOSIS — D329 Benign neoplasm of meninges, unspecified: Secondary | ICD-10-CM | POA: Diagnosis not present

## 2015-02-21 DIAGNOSIS — Z853 Personal history of malignant neoplasm of breast: Secondary | ICD-10-CM | POA: Diagnosis not present

## 2015-02-21 DIAGNOSIS — M810 Age-related osteoporosis without current pathological fracture: Secondary | ICD-10-CM | POA: Diagnosis not present

## 2015-02-21 DIAGNOSIS — Z79891 Long term (current) use of opiate analgesic: Secondary | ICD-10-CM | POA: Diagnosis not present

## 2015-02-21 DIAGNOSIS — I739 Peripheral vascular disease, unspecified: Secondary | ICD-10-CM | POA: Diagnosis not present

## 2015-02-21 DIAGNOSIS — R918 Other nonspecific abnormal finding of lung field: Secondary | ICD-10-CM | POA: Diagnosis not present

## 2015-02-21 DIAGNOSIS — Z951 Presence of aortocoronary bypass graft: Secondary | ICD-10-CM | POA: Diagnosis not present

## 2015-02-21 DIAGNOSIS — I10 Essential (primary) hypertension: Secondary | ICD-10-CM | POA: Diagnosis not present

## 2015-02-21 DIAGNOSIS — I2510 Atherosclerotic heart disease of native coronary artery without angina pectoris: Secondary | ICD-10-CM | POA: Diagnosis not present

## 2015-02-21 DIAGNOSIS — J984 Other disorders of lung: Secondary | ICD-10-CM | POA: Diagnosis not present

## 2015-02-21 DIAGNOSIS — Z792 Long term (current) use of antibiotics: Secondary | ICD-10-CM | POA: Diagnosis not present

## 2015-02-21 DIAGNOSIS — I4891 Unspecified atrial fibrillation: Secondary | ICD-10-CM | POA: Diagnosis not present

## 2015-02-21 DIAGNOSIS — E46 Unspecified protein-calorie malnutrition: Secondary | ICD-10-CM | POA: Diagnosis not present

## 2015-02-21 DIAGNOSIS — K2970 Gastritis, unspecified, without bleeding: Secondary | ICD-10-CM | POA: Diagnosis not present

## 2015-02-21 DIAGNOSIS — G8929 Other chronic pain: Secondary | ICD-10-CM | POA: Diagnosis not present

## 2015-02-21 NOTE — Telephone Encounter (Signed)
Pt's husband Jenny Reichmann, calling requesting a prior auth on Eliquis 5 mg. Pt was in the ED and discharge on 04/21/2014. Husband would like a call back as soon as possible. Pt does not have any medication to take. Please advise

## 2015-02-22 ENCOUNTER — Encounter (HOSPITAL_COMMUNITY): Payer: Self-pay | Admitting: Gastroenterology

## 2015-02-22 ENCOUNTER — Telehealth: Payer: Self-pay | Admitting: Internal Medicine

## 2015-02-22 DIAGNOSIS — K2970 Gastritis, unspecified, without bleeding: Secondary | ICD-10-CM | POA: Diagnosis not present

## 2015-02-22 DIAGNOSIS — I4891 Unspecified atrial fibrillation: Secondary | ICD-10-CM | POA: Diagnosis not present

## 2015-02-22 DIAGNOSIS — S81802D Unspecified open wound, left lower leg, subsequent encounter: Secondary | ICD-10-CM | POA: Diagnosis not present

## 2015-02-22 DIAGNOSIS — J189 Pneumonia, unspecified organism: Secondary | ICD-10-CM | POA: Diagnosis not present

## 2015-02-22 DIAGNOSIS — J984 Other disorders of lung: Secondary | ICD-10-CM | POA: Diagnosis not present

## 2015-02-22 DIAGNOSIS — K259 Gastric ulcer, unspecified as acute or chronic, without hemorrhage or perforation: Secondary | ICD-10-CM | POA: Diagnosis not present

## 2015-02-22 NOTE — Telephone Encounter (Signed)
Spoke with pt's spouse, needs HFU for pt.  Scheduled for 12/5 with TP.  Nothing further needed.

## 2015-02-22 NOTE — Telephone Encounter (Signed)
Pt's husband Jenny Reichmann calling requesting a prior auth on pt's medication Eliquis 5 mg. I explained to the husband that the request has been sent to [facility], the person that does the prior auth and that she is working on it. I told the husband that I would leave samples of Eliquis 5 mg tablets, enough to last for a month, at the front desk for husband to pick up, while waiting on the prior auth to be approve. I advised the husband that if the pt has any other problems, questions or concerns to call the office. Husband verbalized understanding.

## 2015-02-23 DIAGNOSIS — K2970 Gastritis, unspecified, without bleeding: Secondary | ICD-10-CM | POA: Diagnosis not present

## 2015-02-23 DIAGNOSIS — K259 Gastric ulcer, unspecified as acute or chronic, without hemorrhage or perforation: Secondary | ICD-10-CM | POA: Diagnosis not present

## 2015-02-23 DIAGNOSIS — J984 Other disorders of lung: Secondary | ICD-10-CM | POA: Diagnosis not present

## 2015-02-23 DIAGNOSIS — L8993 Pressure ulcer of unspecified site, stage 3: Secondary | ICD-10-CM | POA: Diagnosis not present

## 2015-02-23 DIAGNOSIS — J189 Pneumonia, unspecified organism: Secondary | ICD-10-CM | POA: Diagnosis not present

## 2015-02-23 DIAGNOSIS — I4891 Unspecified atrial fibrillation: Secondary | ICD-10-CM | POA: Diagnosis not present

## 2015-02-23 DIAGNOSIS — D649 Anemia, unspecified: Secondary | ICD-10-CM | POA: Diagnosis not present

## 2015-02-23 DIAGNOSIS — M545 Low back pain: Secondary | ICD-10-CM | POA: Diagnosis not present

## 2015-02-23 DIAGNOSIS — D500 Iron deficiency anemia secondary to blood loss (chronic): Secondary | ICD-10-CM | POA: Diagnosis not present

## 2015-02-23 DIAGNOSIS — K922 Gastrointestinal hemorrhage, unspecified: Secondary | ICD-10-CM | POA: Diagnosis not present

## 2015-02-23 DIAGNOSIS — S81802D Unspecified open wound, left lower leg, subsequent encounter: Secondary | ICD-10-CM | POA: Diagnosis not present

## 2015-02-23 NOTE — Telephone Encounter (Signed)
Left message for patient and her husband to call me with Insurance Info.

## 2015-02-25 ENCOUNTER — Other Ambulatory Visit: Payer: Self-pay

## 2015-02-25 DIAGNOSIS — S81802D Unspecified open wound, left lower leg, subsequent encounter: Secondary | ICD-10-CM | POA: Diagnosis not present

## 2015-02-25 DIAGNOSIS — J189 Pneumonia, unspecified organism: Secondary | ICD-10-CM | POA: Diagnosis not present

## 2015-02-25 DIAGNOSIS — J984 Other disorders of lung: Secondary | ICD-10-CM | POA: Diagnosis not present

## 2015-02-25 DIAGNOSIS — I4891 Unspecified atrial fibrillation: Secondary | ICD-10-CM | POA: Diagnosis not present

## 2015-02-25 DIAGNOSIS — K2970 Gastritis, unspecified, without bleeding: Secondary | ICD-10-CM | POA: Diagnosis not present

## 2015-02-25 DIAGNOSIS — K259 Gastric ulcer, unspecified as acute or chronic, without hemorrhage or perforation: Secondary | ICD-10-CM | POA: Diagnosis not present

## 2015-02-25 MED ORDER — APIXABAN 5 MG PO TABS: 5.0000 mg | ORAL_TABLET | Freq: Two times a day (BID) | ORAL | Status: DC

## 2015-02-25 NOTE — Telephone Encounter (Signed)
Eliquis '5mg'$  re-ordered through [facility] with a 90 day supply. i have asked them to forward PA info to our office. I do NOT have her [facility] ID #. Explained this to her husband.

## 2015-02-25 NOTE — Telephone Encounter (Signed)
Spoke with patient's husband. Advised I will take care of the PA

## 2015-02-28 DIAGNOSIS — J984 Other disorders of lung: Secondary | ICD-10-CM | POA: Diagnosis not present

## 2015-02-28 DIAGNOSIS — K2970 Gastritis, unspecified, without bleeding: Secondary | ICD-10-CM | POA: Diagnosis not present

## 2015-02-28 DIAGNOSIS — J189 Pneumonia, unspecified organism: Secondary | ICD-10-CM | POA: Diagnosis not present

## 2015-02-28 DIAGNOSIS — S81802D Unspecified open wound, left lower leg, subsequent encounter: Secondary | ICD-10-CM | POA: Diagnosis not present

## 2015-02-28 DIAGNOSIS — I4891 Unspecified atrial fibrillation: Secondary | ICD-10-CM | POA: Diagnosis not present

## 2015-02-28 DIAGNOSIS — K259 Gastric ulcer, unspecified as acute or chronic, without hemorrhage or perforation: Secondary | ICD-10-CM | POA: Diagnosis not present

## 2015-03-01 DIAGNOSIS — J189 Pneumonia, unspecified organism: Secondary | ICD-10-CM | POA: Diagnosis not present

## 2015-03-01 DIAGNOSIS — J984 Other disorders of lung: Secondary | ICD-10-CM | POA: Diagnosis not present

## 2015-03-01 DIAGNOSIS — S81802D Unspecified open wound, left lower leg, subsequent encounter: Secondary | ICD-10-CM | POA: Diagnosis not present

## 2015-03-01 DIAGNOSIS — K259 Gastric ulcer, unspecified as acute or chronic, without hemorrhage or perforation: Secondary | ICD-10-CM | POA: Diagnosis not present

## 2015-03-01 DIAGNOSIS — K2970 Gastritis, unspecified, without bleeding: Secondary | ICD-10-CM | POA: Diagnosis not present

## 2015-03-01 DIAGNOSIS — I4891 Unspecified atrial fibrillation: Secondary | ICD-10-CM | POA: Diagnosis not present

## 2015-03-02 ENCOUNTER — Ambulatory Visit: Payer: Self-pay | Admitting: *Deleted

## 2015-03-02 DIAGNOSIS — J984 Other disorders of lung: Secondary | ICD-10-CM | POA: Diagnosis not present

## 2015-03-02 DIAGNOSIS — Z5181 Encounter for therapeutic drug level monitoring: Secondary | ICD-10-CM

## 2015-03-02 DIAGNOSIS — I4891 Unspecified atrial fibrillation: Secondary | ICD-10-CM | POA: Diagnosis not present

## 2015-03-02 DIAGNOSIS — K259 Gastric ulcer, unspecified as acute or chronic, without hemorrhage or perforation: Secondary | ICD-10-CM | POA: Diagnosis not present

## 2015-03-02 DIAGNOSIS — J189 Pneumonia, unspecified organism: Secondary | ICD-10-CM | POA: Diagnosis not present

## 2015-03-02 DIAGNOSIS — S81802D Unspecified open wound, left lower leg, subsequent encounter: Secondary | ICD-10-CM | POA: Diagnosis not present

## 2015-03-02 DIAGNOSIS — K2970 Gastritis, unspecified, without bleeding: Secondary | ICD-10-CM | POA: Diagnosis not present

## 2015-03-04 DIAGNOSIS — S81802D Unspecified open wound, left lower leg, subsequent encounter: Secondary | ICD-10-CM | POA: Diagnosis not present

## 2015-03-04 DIAGNOSIS — K2970 Gastritis, unspecified, without bleeding: Secondary | ICD-10-CM | POA: Diagnosis not present

## 2015-03-04 DIAGNOSIS — I4891 Unspecified atrial fibrillation: Secondary | ICD-10-CM | POA: Diagnosis not present

## 2015-03-04 DIAGNOSIS — J189 Pneumonia, unspecified organism: Secondary | ICD-10-CM | POA: Diagnosis not present

## 2015-03-04 DIAGNOSIS — K259 Gastric ulcer, unspecified as acute or chronic, without hemorrhage or perforation: Secondary | ICD-10-CM | POA: Diagnosis not present

## 2015-03-04 DIAGNOSIS — J984 Other disorders of lung: Secondary | ICD-10-CM | POA: Diagnosis not present

## 2015-03-07 DIAGNOSIS — S81802D Unspecified open wound, left lower leg, subsequent encounter: Secondary | ICD-10-CM | POA: Diagnosis not present

## 2015-03-07 DIAGNOSIS — K259 Gastric ulcer, unspecified as acute or chronic, without hemorrhage or perforation: Secondary | ICD-10-CM | POA: Diagnosis not present

## 2015-03-07 DIAGNOSIS — I4891 Unspecified atrial fibrillation: Secondary | ICD-10-CM | POA: Diagnosis not present

## 2015-03-07 DIAGNOSIS — K2970 Gastritis, unspecified, without bleeding: Secondary | ICD-10-CM | POA: Diagnosis not present

## 2015-03-07 DIAGNOSIS — J984 Other disorders of lung: Secondary | ICD-10-CM | POA: Diagnosis not present

## 2015-03-07 DIAGNOSIS — J189 Pneumonia, unspecified organism: Secondary | ICD-10-CM | POA: Diagnosis not present

## 2015-03-08 DIAGNOSIS — K2970 Gastritis, unspecified, without bleeding: Secondary | ICD-10-CM | POA: Diagnosis not present

## 2015-03-08 DIAGNOSIS — J189 Pneumonia, unspecified organism: Secondary | ICD-10-CM | POA: Diagnosis not present

## 2015-03-08 DIAGNOSIS — J984 Other disorders of lung: Secondary | ICD-10-CM | POA: Diagnosis not present

## 2015-03-08 DIAGNOSIS — S81802D Unspecified open wound, left lower leg, subsequent encounter: Secondary | ICD-10-CM | POA: Diagnosis not present

## 2015-03-08 DIAGNOSIS — I4891 Unspecified atrial fibrillation: Secondary | ICD-10-CM | POA: Diagnosis not present

## 2015-03-08 DIAGNOSIS — K259 Gastric ulcer, unspecified as acute or chronic, without hemorrhage or perforation: Secondary | ICD-10-CM | POA: Diagnosis not present

## 2015-03-09 DIAGNOSIS — K259 Gastric ulcer, unspecified as acute or chronic, without hemorrhage or perforation: Secondary | ICD-10-CM | POA: Diagnosis not present

## 2015-03-09 DIAGNOSIS — J984 Other disorders of lung: Secondary | ICD-10-CM | POA: Diagnosis not present

## 2015-03-09 DIAGNOSIS — K2970 Gastritis, unspecified, without bleeding: Secondary | ICD-10-CM | POA: Diagnosis not present

## 2015-03-09 DIAGNOSIS — I4891 Unspecified atrial fibrillation: Secondary | ICD-10-CM | POA: Diagnosis not present

## 2015-03-09 DIAGNOSIS — S81802D Unspecified open wound, left lower leg, subsequent encounter: Secondary | ICD-10-CM | POA: Diagnosis not present

## 2015-03-09 DIAGNOSIS — J189 Pneumonia, unspecified organism: Secondary | ICD-10-CM | POA: Diagnosis not present

## 2015-03-10 ENCOUNTER — Ambulatory Visit (INDEPENDENT_AMBULATORY_CARE_PROVIDER_SITE_OTHER): Payer: Medicare Other | Admitting: Internal Medicine

## 2015-03-10 ENCOUNTER — Encounter (INDEPENDENT_AMBULATORY_CARE_PROVIDER_SITE_OTHER): Payer: Self-pay | Admitting: Internal Medicine

## 2015-03-10 VITALS — BP 104/64 | HR 64 | Temp 97.8°F | Ht 64.0 in | Wt 106.9 lb

## 2015-03-10 DIAGNOSIS — I4891 Unspecified atrial fibrillation: Secondary | ICD-10-CM | POA: Diagnosis not present

## 2015-03-10 DIAGNOSIS — S81802D Unspecified open wound, left lower leg, subsequent encounter: Secondary | ICD-10-CM | POA: Diagnosis not present

## 2015-03-10 DIAGNOSIS — K2970 Gastritis, unspecified, without bleeding: Secondary | ICD-10-CM | POA: Diagnosis not present

## 2015-03-10 DIAGNOSIS — J189 Pneumonia, unspecified organism: Secondary | ICD-10-CM | POA: Diagnosis not present

## 2015-03-10 DIAGNOSIS — K259 Gastric ulcer, unspecified as acute or chronic, without hemorrhage or perforation: Secondary | ICD-10-CM | POA: Diagnosis not present

## 2015-03-10 DIAGNOSIS — K922 Gastrointestinal hemorrhage, unspecified: Secondary | ICD-10-CM | POA: Diagnosis not present

## 2015-03-10 DIAGNOSIS — I6523 Occlusion and stenosis of bilateral carotid arteries: Secondary | ICD-10-CM | POA: Diagnosis not present

## 2015-03-10 DIAGNOSIS — J984 Other disorders of lung: Secondary | ICD-10-CM | POA: Diagnosis not present

## 2015-03-10 LAB — CBC WITH DIFFERENTIAL/PLATELET
BASOS PCT: 0 % (ref 0–1)
Basophils Absolute: 0 10*3/uL (ref 0.0–0.1)
EOS ABS: 0 10*3/uL (ref 0.0–0.7)
Eosinophils Relative: 0 % (ref 0–5)
HCT: 32.7 % — ABNORMAL LOW (ref 36.0–46.0)
HEMOGLOBIN: 10.8 g/dL — AB (ref 12.0–15.0)
Lymphocytes Relative: 7 % — ABNORMAL LOW (ref 12–46)
Lymphs Abs: 1.4 10*3/uL (ref 0.7–4.0)
MCH: 28.8 pg (ref 26.0–34.0)
MCHC: 33 g/dL (ref 30.0–36.0)
MCV: 87.2 fL (ref 78.0–100.0)
MPV: 9.3 fL (ref 8.6–12.4)
Monocytes Absolute: 2.4 10*3/uL — ABNORMAL HIGH (ref 0.1–1.0)
Monocytes Relative: 12 % (ref 3–12)
NEUTROS ABS: 15.9 10*3/uL — AB (ref 1.7–7.7)
NEUTROS PCT: 81 % — AB (ref 43–77)
PLATELETS: 654 10*3/uL — AB (ref 150–400)
RBC: 3.75 MIL/uL — AB (ref 3.87–5.11)
RDW: 15.4 % (ref 11.5–15.5)
WBC: 19.6 10*3/uL — AB (ref 4.0–10.5)

## 2015-03-10 NOTE — Patient Instructions (Signed)
CBC today. OV in 3 months.

## 2015-03-10 NOTE — Progress Notes (Signed)
Subjective:    Patient ID: [name], female    DOB: [birth year], 74 y.o.   MRN: [id number]  HPI Here today for f/u after recent UGI bleed. Admitted to [facility] 02/14/2015. Apparently had blood works by her PCP on 02/11/2015 and her hemoglobin came back at 8.3. She started having black stools and rectal bleeding and her husband took her to the ED.  She was transferred to [facility].  She received PRBCs and FFP while admitted. Patient is on Plavix carotid artery disease. She was on Warfarin for A. Fib. She was taking Mobic and ASA also for chronic back pain which has now been d/c.  Warfarin d/c. She is taking Plavis and Eliquis at present.  She tells me she has not seen any further rectal bleeding. She feels better. She has some weakness. She is trying to eat. She is having a BM every 2-3 days. Patient is on chronic pain medication and takes Movantik.  No melena or BRRB. No nausea,vomiting or abdominal pain.  Patient is in a W/C but is able to ambulate. She underwent and EGD 02/16/2015 for UGIB. See below.  02/16/2015 EGD: GI Bleed: Dr. Ionia Cellar Normal esophagus. 5cm hiatal hernia. Gastritis with numerous clean based ulcers of the stomach with a few cleaned based ulcers in the duodenal bulb.   Hemoglobin 02/20/2015 12.0.      CBC    Component Value Date/Time   WBC 20.8* 02/20/2015 0418   WBC 8.4 08/11/2010   RBC 4.07 02/20/2015 0418   RBC 2.50* 05/21/2014 0933   HGB 12.0 02/20/2015 0418   HCT 36.6 02/20/2015 0418   PLT 698* 02/20/2015 0418   MCV 89.9 02/20/2015 0418   MCH 29.5 02/20/2015 0418   MCHC 32.8 02/20/2015 0418   RDW 15.2 02/20/2015 0418   LYMPHSABS 2.3 02/14/2015 2005   MONOABS 2.4* 02/14/2015 2005   EOSABS 0.0 02/14/2015 2005   BASOSABS 0.1 02/14/2015 2005       Review of Systems Past Medical History  Diagnosis Date  . Subclavian steal syndrome   . Carotid bruit     LICA 73-42% (duplex 8/76)  . Hypertension   . Hyperlipidemia   . Atrial  fibrillation ([place])     x 3 yrs  . Meningioma ([place])   . Osteoporosis   . Stroke [facility])     CVA 2008  . Pneumonia May 19, 2014  . Breast cancer ([place])     S/P left mastectomy and chemotherapy 1989 remained in remission  . Anemia 05/2014.  . Carotid artery occlusion   . Ulcers of both lower extremities ([place]) 2015  . Degenerative arthritis of spine 2015    Chronic back pain    Past Surgical History  Procedure Laterality Date  . Cardiac catheterization    . Mastectomy Left 1990  . Cataract extraction Bilateral 2013  . Colonoscopy  11/17/2010    Procedure: COLONOSCOPY;  Surgeon: [name], MD;  [facility];  Service: Endoscopy;  Laterality: N/A;  10:45 am  . Esophagogastroduodenoscopy  11/17/2010    Procedure: ESOPHAGOGASTRODUODENOSCOPY (EGD);  Surgeon: [name], MD;  Location: [facility];  Service: Endoscopy;  Laterality: N/A;  . Lumbar epidural injection  06-2012--06-2013    pt. states she has had 5 epidurals in 06-2012----06-2013  . Eye surgery    . Exploratory laparotomy  1960s    For peritonitis of undetermined cause  . Esophagogastroduodenoscopy N/A 02/16/2015    Procedure: ESOPHAGOGASTRODUODENOSCOPY (EGD);  Surgeon:  [name], MD;  Location: [facility];  Service: Gastroenterology;  Laterality: N/A;    Allergies  Allergen Reactions  . Iodine Rash and Other (See Comments)    BETADINE Rash/burning, blisters on skin.  . Iohexol Rash and Other (See Comments)    Blisters; PT NEEDS 13-HOUR PREP   . Tape Itching and Rash    Prefers PAPER TAPE    Current Outpatient Prescriptions on File Prior to Visit  Medication Sig Dispense Refill  . albuterol (PROVENTIL HFA;VENTOLIN HFA) 108 (90 BASE) MCG/ACT inhaler Inhale 1-2 puffs into the lungs every 6 (six) hours as needed for wheezing or shortness of breath.    Marland Kitchen alendronate (FOSAMAX) 70 MG tablet Take 1 tablet (70 mg total) by mouth every Sunday. Take in the morning with a full glass of  water, on empty stomach 30 tablet 0  . amiodarone (PACERONE) 200 MG tablet Take 1 tablet by mouth  daily 30 tablet 6  . amLODipine (NORVASC) 10 MG tablet Take 10 mg by mouth every evening.     Marland Kitchen amoxicillin-clavulanate (AUGMENTIN) 875-125 MG tablet Take 1 tablet by mouth 2 (two) times daily. X 7days 14 tablet 0  . apixaban (ELIQUIS) 5 MG TABS tablet Take 1 tablet (5 mg total) by mouth 2 (two) times daily. 180 tablet 3  . calcium-vitamin D (OSCAL) 250-125 MG-UNIT per tablet Take 1 tablet by mouth 2 (two) times daily.      . clopidogrel (PLAVIX) 75 MG tablet Take 1 tablet by mouth  every morning 90 tablet 3  . colesevelam (WELCHOL) 625 MG tablet Take 3 tablets by mouth  twice a day with a meal (Patient taking differently: Take 1,875 mg by mouth 2 (two) times daily with a meal. ) 540 tablet 3  . ferrous sulfate 325 (65 FE) MG tablet Take 325 mg by mouth daily with breakfast.    . HYDROcodone-acetaminophen (NORCO/VICODIN) 5-325 MG tablet Take 1 tablet by mouth every 6 (six) hours as needed for moderate pain. 20 tablet 0  . irbesartan (AVAPRO) 300 MG tablet Take 300 mg by mouth every evening.     . lactose free nutrition (BOOST PLUS) LIQD Take 237 mLs by mouth 3 (three) times daily between meals. Over the counter  0  . magnesium oxide (MAG-OX) 400 MG tablet Take 400 mg by mouth every morning.     . metoprolol (LOPRESSOR) 25 MG tablet Take 75 mg (three) tablets by mouth twice a day (Patient taking differently: Take 75 mg by mouth 2 (two) times daily. Take 75 mg (three) tablets by mouth twice a) 180 tablet 6  . Multiple Vitamin (MULTIVITAMIN WITH MINERALS) TABS tablet Take 1 tablet by mouth every morning.    . naloxegol oxalate (MOVANTIK) 25 MG TABS tablet Take 25 mg by mouth daily as needed.    . Omega-3 Fatty Acids (FISH OIL) 1200 MG CAPS Take 1,200 mg by mouth 2 (two) times daily.    Marland Kitchen oxyCODONE-acetaminophen (PERCOCET) 7.5-325 MG per tablet Take 0.5 tablets by mouth at bedtime as needed. For pain    .  pantoprazole (PROTONIX) 40 MG tablet Take 1 tablet (40 mg total) by mouth 2 (two) times daily. 60 tablet 3  . pravastatin (PRAVACHOL) 40 MG tablet Take 1 tablet by mouth  every night at bedtime 90 tablet 1  . SANTYL ointment Apply 1 application topically daily. Applied to sore on ankle    . vitamin C (ASCORBIC ACID) 500 MG tablet Take 500 mg by mouth every morning.  No current facility-administered medications on file prior to visit.        Objective:   Physical ExamBlood pressure 104/64, pulse 64, temperature 97.8 F (36.6 C), height '5\' 4"'$  (1.626 m), weight 106 lb 14.4 oz (48.49 kg).  Alert and oriented. Skin warm and dry. Oral mucosa is moist.   . Sclera anicteric, conjunctivae is pink. Thyroid not enlarged. No cervical lymphadenopathy. Lungs clear. Heart regular rate and rhythm.  Abdomen is soft. Bowel sounds are positive. No hepatomegaly. No abdominal masses felt. No tenderness.  No edema to lower extremities.         Assessment & Plan:  UGI bleeding for her anticoagulation. Meds have been changed to Plavix and Eliquis. Will get a CBC today. OV in 3 months.

## 2015-03-11 ENCOUNTER — Encounter (INDEPENDENT_AMBULATORY_CARE_PROVIDER_SITE_OTHER): Payer: Self-pay | Admitting: *Deleted

## 2015-03-11 ENCOUNTER — Telehealth (INDEPENDENT_AMBULATORY_CARE_PROVIDER_SITE_OTHER): Payer: Self-pay | Admitting: *Deleted

## 2015-03-11 DIAGNOSIS — K2970 Gastritis, unspecified, without bleeding: Secondary | ICD-10-CM | POA: Diagnosis not present

## 2015-03-11 DIAGNOSIS — S81802D Unspecified open wound, left lower leg, subsequent encounter: Secondary | ICD-10-CM | POA: Diagnosis not present

## 2015-03-11 DIAGNOSIS — J984 Other disorders of lung: Secondary | ICD-10-CM | POA: Diagnosis not present

## 2015-03-11 DIAGNOSIS — K259 Gastric ulcer, unspecified as acute or chronic, without hemorrhage or perforation: Secondary | ICD-10-CM | POA: Diagnosis not present

## 2015-03-11 DIAGNOSIS — K922 Gastrointestinal hemorrhage, unspecified: Secondary | ICD-10-CM

## 2015-03-11 DIAGNOSIS — J189 Pneumonia, unspecified organism: Secondary | ICD-10-CM | POA: Diagnosis not present

## 2015-03-11 DIAGNOSIS — I4891 Unspecified atrial fibrillation: Secondary | ICD-10-CM | POA: Diagnosis not present

## 2015-03-11 NOTE — Telephone Encounter (Signed)
.  Per Lelon Perla patient to have lab in 4 weeks.

## 2015-03-14 ENCOUNTER — Ambulatory Visit (INDEPENDENT_AMBULATORY_CARE_PROVIDER_SITE_OTHER): Payer: Medicare Other | Admitting: Adult Health

## 2015-03-14 ENCOUNTER — Ambulatory Visit (INDEPENDENT_AMBULATORY_CARE_PROVIDER_SITE_OTHER)
Admission: RE | Admit: 2015-03-14 | Discharge: 2015-03-14 | Disposition: A | Discharge status: Home / Self Care | Payer: Medicare Other | Source: Ambulatory Visit | Attending: Adult Health | Admitting: Adult Health

## 2015-03-14 ENCOUNTER — Encounter: Payer: Self-pay | Admitting: Adult Health

## 2015-03-14 ENCOUNTER — Ambulatory Visit (INDEPENDENT_AMBULATORY_CARE_PROVIDER_SITE_OTHER): Payer: Medicare Other | Admitting: Cardiology

## 2015-03-14 ENCOUNTER — Encounter: Payer: Self-pay | Admitting: Cardiology

## 2015-03-14 VITALS — BP 100/52 | HR 81 | Ht 64.0 in | Wt 106.0 lb

## 2015-03-14 VITALS — BP 102/60 | HR 82 | Temp 97.9°F | Ht 64.0 in | Wt 108.0 lb

## 2015-03-14 DIAGNOSIS — I4891 Unspecified atrial fibrillation: Secondary | ICD-10-CM | POA: Diagnosis not present

## 2015-03-14 DIAGNOSIS — R938 Abnormal findings on diagnostic imaging of other specified body structures: Secondary | ICD-10-CM

## 2015-03-14 DIAGNOSIS — R0989 Other specified symptoms and signs involving the circulatory and respiratory systems: Secondary | ICD-10-CM | POA: Diagnosis not present

## 2015-03-14 DIAGNOSIS — R918 Other nonspecific abnormal finding of lung field: Secondary | ICD-10-CM | POA: Diagnosis not present

## 2015-03-14 DIAGNOSIS — Z7901 Long term (current) use of anticoagulants: Secondary | ICD-10-CM

## 2015-03-14 DIAGNOSIS — J189 Pneumonia, unspecified organism: Secondary | ICD-10-CM | POA: Diagnosis not present

## 2015-03-14 DIAGNOSIS — I480 Paroxysmal atrial fibrillation: Secondary | ICD-10-CM | POA: Diagnosis not present

## 2015-03-14 DIAGNOSIS — J984 Other disorders of lung: Secondary | ICD-10-CM

## 2015-03-14 DIAGNOSIS — I10 Essential (primary) hypertension: Secondary | ICD-10-CM

## 2015-03-14 DIAGNOSIS — I6523 Occlusion and stenosis of bilateral carotid arteries: Secondary | ICD-10-CM | POA: Diagnosis not present

## 2015-03-14 LAB — BASIC METABOLIC PANEL
BUN: 16 mg/dL (ref 7–25)
CO2: 23 mmol/L (ref 20–31)
Calcium: 8.5 mg/dL — ABNORMAL LOW (ref 8.6–10.4)
Chloride: 101 mmol/L (ref 98–110)
Creat: 0.57 mg/dL — ABNORMAL LOW (ref 0.60–0.93)
GLUCOSE: 111 mg/dL — AB (ref 65–99)
POTASSIUM: 3.9 mmol/L (ref 3.5–5.3)
SODIUM: 135 mmol/L (ref 135–146)

## 2015-03-14 LAB — CBC WITH DIFFERENTIAL/PLATELET
BASOS PCT: 0 % (ref 0–1)
Basophils Absolute: 0 10*3/uL (ref 0.0–0.1)
Eosinophils Absolute: 0 10*3/uL (ref 0.0–0.7)
Eosinophils Relative: 0 % (ref 0–5)
HCT: 31.6 % — ABNORMAL LOW (ref 36.0–46.0)
HEMOGLOBIN: 10.7 g/dL — AB (ref 12.0–15.0)
Lymphocytes Relative: 6 % — ABNORMAL LOW (ref 12–46)
Lymphs Abs: 1.5 10*3/uL (ref 0.7–4.0)
MCH: 29 pg (ref 26.0–34.0)
MCHC: 33.9 g/dL (ref 30.0–36.0)
MCV: 85.6 fL (ref 78.0–100.0)
MPV: 8.9 fL (ref 8.6–12.4)
Monocytes Absolute: 3 10*3/uL — ABNORMAL HIGH (ref 0.1–1.0)
Monocytes Relative: 12 % (ref 3–12)
NEUTROS ABS: 20.3 10*3/uL — AB (ref 1.7–7.7)
NEUTROS PCT: 82 % — AB (ref 43–77)
Platelets: 614 10*3/uL — ABNORMAL HIGH (ref 150–400)
RBC: 3.69 MIL/uL — AB (ref 3.87–5.11)
RDW: 14.8 % (ref 11.5–15.5)
WBC: 24.7 10*3/uL — AB (ref 4.0–10.5)

## 2015-03-14 MED ORDER — IRBESARTAN 150 MG PO TABS: 150.0000 mg | ORAL_TABLET | Freq: Every evening | ORAL | Status: DC

## 2015-03-14 MED ORDER — AMOXICILLIN-POT CLAVULANATE 875-125 MG PO TABS: 1.0000 | ORAL_TABLET | Freq: Two times a day (BID) | ORAL | Status: AC

## 2015-03-14 NOTE — Progress Notes (Signed)
Cardiology Office Note   Date:  03/14/2015   ID:  [name], [name] April 13, 1940, MRN [id number]  PCP:  Wende Neighbors, MD  Cardiologist:  [name]    Chief Complaint  Patient presents with  . Hospitalization Follow-up    feels weak today      History of Present Illness: [name] is a 74 y.o. female who presents for post hospitalization.  She has a hx of PAF, bruit, right subclavian steal and anticoagulatin She has known vascular disease primarily in the sublavian and carotids. She was admitted with syncope last year . She had PAF with diastolic heart failure. Her cath showed only diagonal disease with no critical major vessel disease. EF was normal. 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 She has 60-79% stenosis on left ICA. In combination with rapid atrial fibrillation this may have contributed to a brief loss of consdousness. Her diastolic CHF cleared with restoration of NSR. She was sent home on low dose amiodarone.  Recent admit 02/2015 for GI bleed exacerbated by NSAID use and warfarin coagulopathy .  With that her ASA and coumadin have been discontinued-but placed on eliquis and continues on Plavix. .  She remains on amiodarone. Dr. Debara Pickett saw her in the hospital and this was his recommendation.   1. "Certainly her bleeding risk is very high on triple therapy. Her annual risk of stroke is 10%, this is reduced to 5.5% on aspirin and Plavix alone and would be 3.3% on triple therapy, however the risk of major bleeding would be 3.8%. If we considered an alternative agent such as Eliquis, stroke risk would be reduced to 2.4% with similar adverse bleeding risk. A direct oral anticoagulant may be slightly safer in her given the labile INR and recent weight change with antibiotic use. The concern would be the inability to easily reverse this medication if she were to have recurrent GI bleeding." 2. Per Dr.  Debara Pickett   Additionally was found to have PNA/rt lung mass on CT of chest previously seen in Feb but resolved in June and now returned was placed on Augmentin and has completed course.  To see pulmonary today.    Recent CBC with hgb at 10.8 down from 12 at discharge.   Today  She denies any black stools or obvious blood in her stools.  No chest pain. Mild SOB this AM.  No palpitations.  Her back is bothering her.  But just doesn't feel as well as she did.   No fevers.    Past Medical History  Diagnosis Date  . Subclavian steal syndrome   . Carotid bruit     LICA 51-88% (duplex 4/16)  . Hypertension   . Hyperlipidemia   . Atrial fibrillation ([place])     x 3 yrs  . Meningioma ([place])   . Osteoporosis   . Stroke [facility])     CVA 2008  . Pneumonia May 19, 2014  . Breast cancer ([place])     S/P left mastectomy and chemotherapy 1989 remained in remission  . Anemia 05/2014.  . Carotid artery occlusion   . Ulcers of both lower extremities ([place]) 2015  . Degenerative arthritis of spine 2015    Chronic back pain    Past Surgical History  Procedure Laterality Date  . Cardiac catheterization    . Mastectomy Left 1990  . Cataract extraction Bilateral 2013  . Colonoscopy  11/17/2010  Procedure: COLONOSCOPY;  Surgeon: [name], MD;  [facility];  Service: Endoscopy;  Laterality: N/A;  10:45 am  . Esophagogastroduodenoscopy  11/17/2010    Procedure: ESOPHAGOGASTRODUODENOSCOPY (EGD);  Surgeon: [name], MD;  Location[facility];  Service: Endoscopy;  Laterality: N/A;  . Lumbar epidural injection  06-2012--06-2013    pt. states she has had 5 epidurals in 06-2012----06-2013  . Eye surgery    . Exploratory laparotomy  1960s    For peritonitis of undetermined cause  . Esophagogastroduodenoscopy N/A 02/16/2015    Procedure: ESOPHAGOGASTRODUODENOSCOPY (EGD);  Surgeon: [name], MD;  Location: [place];  Service: Gastroenterology;  Laterality: N/A;      Current Outpatient Prescriptions  Medication Sig Dispense Refill  . albuterol (PROVENTIL HFA;VENTOLIN HFA) 108 (90 BASE) MCG/ACT inhaler Inhale 1-2 puffs into the lungs every 6 (six) hours as needed for wheezing or shortness of breath.    Marland Kitchen alendronate (FOSAMAX) 70 MG tablet Take 1 tablet (70 mg total) by mouth every Sunday. Take in the morning with a full glass of water, on empty stomach 30 tablet 0  . amiodarone (PACERONE) 200 MG tablet Take 1 tablet by mouth  daily 30 tablet 6  . amLODipine (NORVASC) 10 MG tablet Take 10 mg by mouth every evening.     Marland Kitchen apixaban (ELIQUIS) 5 MG TABS tablet Take 1 tablet (5 mg total) by mouth 2 (two) times daily. 180 tablet 3  . calcium-vitamin D (OSCAL) 250-125 MG-UNIT per tablet Take 1 tablet by mouth 2 (two) times daily.      . clopidogrel (PLAVIX) 75 MG tablet Take 1 tablet by mouth  every morning 90 tablet 3  . colesevelam (WELCHOL) 625 MG tablet Take 1,875 mg by mouth 2 (two) times daily with a meal.    . ferrous sulfate 325 (65 FE) MG tablet Take 325 mg by mouth daily with breakfast.    . HYDROcodone-acetaminophen (NORCO/VICODIN) 5-325 MG tablet Take 1 tablet by mouth every 6 (six) hours as needed for moderate pain. 20 tablet 0  . irbesartan (AVAPRO) 150 MG tablet Take 1 tablet (150 mg total) by mouth every evening.    . lactose free nutrition (BOOST PLUS) LIQD Take 237 mLs by mouth 3 (three) times daily between meals. Over the counter  0  . magnesium oxide (MAG-OX) 400 MG tablet Take 400 mg by mouth every morning.     . metoprolol tartrate (LOPRESSOR) 25 MG tablet Take 75 mg by mouth 2 (two) times daily.    . Multiple Vitamin (MULTIVITAMIN WITH MINERALS) TABS tablet Take 1 tablet by mouth every morning.    . naloxegol oxalate (MOVANTIK) 25 MG TABS tablet Take 25 mg by mouth daily as needed (constipation).     . Omega-3 Fatty Acids (FISH OIL) 1200 MG CAPS Take 1,200 mg by mouth 2 (two) times daily.    Marland Kitchen oxyCODONE-acetaminophen (PERCOCET) 7.5-325 MG  per tablet Take 0.5 tablets by mouth at bedtime as needed. For pain    . pantoprazole (PROTONIX) 40 MG tablet Take 1 tablet (40 mg total) by mouth 2 (two) times daily. 60 tablet 3  . pravastatin (PRAVACHOL) 40 MG tablet Take 1 tablet by mouth  every night at bedtime 90 tablet 1  . SANTYL ointment Apply 1 application topically daily. Applied to sore on ankle    . vitamin C (ASCORBIC ACID) 500 MG tablet Take 500 mg by mouth every morning.      No current facility-administered medications for  this visit.    Allergies:   Iodine; Iohexol; and Tape    Social History:  The patient  reports that she quit smoking about 8 years ago. Her smoking use included Cigarettes. She has a 56 pack-year smoking history. She has never used smokeless tobacco. She reports that she drinks alcohol. She reports that she does not use illicit drugs.   Family History:  The patient's family history includes Alzheimer's disease in her mother; Dementia in her mother; Hyperlipidemia in her mother; Hypertension in her mother; Parkinsonism in her father.    ROS:  General:no colds or fevers,  weight continues to decrease  Skin:no rashes or ulcers HEENT:no blurred vision, no congestion CV:see HPI PUL:see HPI GI:no diarrhea constipation or melena, no indigestion, some nausea with her meds. GU:no hematuria, no dysuria MS:no joint pain, no claudication, + back pain Neuro:no syncope, no lightheadedness Endo:no diabetes, no thyroid disease  Wt Readings from Last 3 Encounters:  03/14/15 106 lb (48.081 kg)  03/10/15 106 lb 14.4 oz (48.49 kg)  02/15/15 116 lb 2.9 oz (52.7 kg)     PHYSICAL EXAM: VS:  BP 100/52 mmHg  Pulse 81  Ht '5\' 4"'$  (1.626 m)  Wt 106 lb (48.081 kg)  BMI 18.19 kg/m2 , BMI Body mass index is 18.19 kg/(m^2). General:Pleasant affect, NAD, harsh cough Skin:Warm and dry, brisk capillary refill HEENT:normocephalic, sclera clear, mucus membranes moist Neck:supple, no JVD, + bruits  Heart:S1S2 RRR without  murmur, gallup, rub or click Lungs:diminished on rt side 2/3's up then clear, lt lung clear without rales, rhonchi, or wheezes IWL:NLGX, non tender, + BS, do not palpate liver spleen or masses Ext:no lower ext edema, 2+ pedal pulses, 2+ radial pulses Neuro:alert and oriented X 3, MAE, follows commands, + facial symmetry    EKG:  EKG is NOT ordered today.    Recent Labs: 05/21/2014: TSH 1.703 02/19/2015: ALT 17 02/20/2015: BUN 8; Creatinine, Ser 0.68; Potassium 3.8; Sodium 131* 03/10/2015: Hemoglobin 10.8*; Platelets 654*    Lipid Panel    Component Value Date/Time   CHOL 137 08/11/2010   TRIG 68 08/11/2010   HDL 63 08/11/2010   CHOLHDL 1.9 10/11/2009 0610   VLDL 17 10/11/2009 0610   LDLCALC 60 08/11/2010       Other studies Reviewed: Additional studies/ records that were reviewed today include: hospital notes, CT scan.   ASSESSMENT AND PLAN:   1. GI bleed- denies any further bleeding but feeling weak today will recheck her CBC. Also her BMET as asked post hospital.  Follow up with [name] 1st available.    2. PAF- now on plavix and eliquis.  No obvious bleeding currently. Maintaining SR  3. HTN, now much lower than her usual on lopressor 75 and amlodipine 10 and avapro 300, will decrease the Avapro to give her more BP to see if this helps her fatigue/weakness.     4.  Vascular: right innominate occlusion with steal moderate Left ICA disease. F/u [facility]  5. Chol: On statin. Labs with primary   6. Pulmonary: F/u CT in 3/17 Cavitary lung lesion not cancer now recurrent    Current medicines are reviewed with the patient today.  The patient Has no concerns regarding medicines.  The following changes have been made:  See above Labs/ tests ordered today include:see above  Disposition:   FU:  see above  Signed, Isaiah Serge, NP  03/14/2015 1:22 PM    [facility] [address] [place], [place], [place]  [place], [place] Phone: [phone]; Fax: [phone]

## 2015-03-14 NOTE — Patient Instructions (Signed)
Set up for CT chest in 4 weeks.  Continue on current regimen  Please contact office for sooner follow up if symptoms do not improve or worsen or seek emergency care  follow up Dr. Chase Caller in  4 weeks after CT chest

## 2015-03-14 NOTE — Patient Instructions (Addendum)
Medication Instructions:  Your physician has recommended you make the following change in your medication:  REDUCE Avapro to '150mg'$  daily   Labwork: Cbc today  Testing/Procedures: None ordered  Follow-Up: Your physician recommends that you schedule a follow-up appointment first available with Dr.Nishan   Any Other Special Instructions Will Be Listed Below (If Applicable).     If you need a refill on your cardiac medications before your next appointment, please call your pharmacy.

## 2015-03-14 NOTE — Progress Notes (Signed)
Subjective:    Patient ID: [name], female    DOB: 1940-12-16, 74 y.o.   MRN: [id number]  HPI 73 year old female with significant neuromuscular disability due to prior stroke, spinal issues.    STUDIES:  CT chest (05/2014) Masslike area of consolidation RLL, r hilar and mediastinal LAN CT chest (06/2014) Significant improvement of RLL opacification, cavitary lesion persists suggestive of resolving PNA with necrosis.  CT chest (09/2014) Near complete collapse of the right lower lobe cavitary lesion with residual pleural-parenchymal thickening. New fine ground-glass nodules in the right middle lobe consistent with pulmonary infection. CT chest (02/15/2015) Recurrent to rounded masslike area of consolidation in the right lower lobe. Given that this area was present previously then resolved and has recurrent quickly, this is most compatible with rounded pneumonia. Small right pleural effusion with adjacent right lower lobe atelectasis or consolidation. Mildly enlarged precarinal lymph node, likely reactive.  03/14/2015 [facility] follow up  Patient returns for a post hospital follow-up Patient was recently admitted 1 month ago for a acute GI bleed and cavitary lung lesion. On admissions. Patient's INR was 10 while on Coumadin. Coumadin was held. She required 4 units of packed red blood cells, and 5 units of FFP. He was seen by gastroenterology. She underwent endoscopy on November 9 that showed a normal esophagus and gastritis with numerous clean, linear ulcers of the stomach and a few ulcers of the duodenal bulb. She did stabilize and was transitioned to Eliquis '5mg'$  Twice daily  Prior to discharge.  CT chest showed a recurrent rounded masslike area of consolidation on the right lower lobe measuring 9.5 x 6.7 cm.  She was treated with IV antibiotics. And discharged on 7 additional days of Augmentin.. Was admitted around 05/20/2014 with significant right lower lobe pneumonia/huge masslike  appearance on CT scan of the chest. Apparently she was extensively asymptomatic at this point in time. Treated with antibiotics. She had follow-up CT scan of the chest which shows significant improvement in the right lower lobe opacity but with a residual 5.2 cm thick-walled cavitary lesion.  CT scan of the chest June 2016. This showed near resolution of the cavity and it is down to a scar tissue.  Since discharge, patient remains very weak. White count has been trending down as it had maximum peak at 41,000 however remains at 24K .  She denies any fever, hemoptysis, orthopnea, PND, or increased leg swelling..   Review of Systems Constitutional:   No  weight loss, night sweats,  Fevers, chills,  +fatigue, or  lassitude.  HEENT:   No headaches,  Difficulty swallowing,  Tooth/dental problems, or  Sore throat,                No sneezing, itching, ear ache, +nasal congestion, post nasal drip  CV:  No chest pain,  Orthopnea, PND, swelling in lower extremities, anasarca, dizziness, palpitations, syncope.   GI  No heartburn, indigestion, abdominal pain, nausea, vomiting, diarrhea, change in bowel habits, loss of appetite, bloody stools.   Resp:.  No chest wall deformity  Skin: no rash or lesions.  GU: no dysuria, change in color of urine, no urgency or frequency.  No flank pain, no hematuria   MS:  No joint pain or swelling.  No decreased range of motion.  No back pain.  Psych:  No change in mood or affect. No depression or anxiety.  No memory loss.         Objective:   Physical Exam  GEN: A/Ox3; pleasant , NAD, frail and elderly in wheelchair  HEENT:  [place]/AT,  EACs-clear, TMs-wnl, NOSE-clear, THROAT-clear, no lesions, no postnasal drip or exudate noted.   NECK:  Supple w/ fair ROM; no JVD; normal carotid impulses w/o bruits; no thyromegaly or nodules palpated; no lymphadenopathy.  RESP  few scattered rhonchi .no accessory muscle use, no dullness to percussion  CARD:  RRR, no  m/r/g  , no peripheral edema, pulses intact, no cyanosis or clubbing.  GI:   Soft & nt; nml bowel sounds; no organomegaly or masses detected.  Musco: Warm bil, no deformities or joint swelling noted.   Neuro: alert, no focal deficits noted.    Skin: Warm, no lesions or rashes       Assessment & Plan:

## 2015-03-14 NOTE — Assessment & Plan Note (Signed)
Recurrent PNA  ,? Aspiration - on return will need to look at possible swallow eval.   no significant improvement on CXR  Will need prolonged course of abx  Case and films reviewed with Dr. Chase Caller  Pt called and updated on cxr .    Plan  Late add  Start Augmentin twice daily for 3 weeks Follow-up chest x-ray on return in 3 weeks. Consider swallow evaluation on return Patient to use probiotic and yogurt while on antibiotics Please contact office for sooner follow up if symptoms do not improve or worsen or seek emergency care

## 2015-03-16 DIAGNOSIS — J984 Other disorders of lung: Secondary | ICD-10-CM | POA: Diagnosis not present

## 2015-03-16 DIAGNOSIS — J189 Pneumonia, unspecified organism: Secondary | ICD-10-CM | POA: Diagnosis not present

## 2015-03-16 DIAGNOSIS — S81802D Unspecified open wound, left lower leg, subsequent encounter: Secondary | ICD-10-CM | POA: Diagnosis not present

## 2015-03-16 DIAGNOSIS — K259 Gastric ulcer, unspecified as acute or chronic, without hemorrhage or perforation: Secondary | ICD-10-CM | POA: Diagnosis not present

## 2015-03-16 DIAGNOSIS — K2970 Gastritis, unspecified, without bleeding: Secondary | ICD-10-CM | POA: Diagnosis not present

## 2015-03-16 DIAGNOSIS — I4891 Unspecified atrial fibrillation: Secondary | ICD-10-CM | POA: Diagnosis not present

## 2015-03-17 DIAGNOSIS — I4891 Unspecified atrial fibrillation: Secondary | ICD-10-CM | POA: Diagnosis not present

## 2015-03-17 DIAGNOSIS — J189 Pneumonia, unspecified organism: Secondary | ICD-10-CM | POA: Diagnosis not present

## 2015-03-17 DIAGNOSIS — K259 Gastric ulcer, unspecified as acute or chronic, without hemorrhage or perforation: Secondary | ICD-10-CM | POA: Diagnosis not present

## 2015-03-17 DIAGNOSIS — K2970 Gastritis, unspecified, without bleeding: Secondary | ICD-10-CM | POA: Diagnosis not present

## 2015-03-17 DIAGNOSIS — J984 Other disorders of lung: Secondary | ICD-10-CM | POA: Diagnosis not present

## 2015-03-17 DIAGNOSIS — S81802D Unspecified open wound, left lower leg, subsequent encounter: Secondary | ICD-10-CM | POA: Diagnosis not present

## 2015-03-18 DIAGNOSIS — J984 Other disorders of lung: Secondary | ICD-10-CM | POA: Diagnosis not present

## 2015-03-18 DIAGNOSIS — K2970 Gastritis, unspecified, without bleeding: Secondary | ICD-10-CM | POA: Diagnosis not present

## 2015-03-18 DIAGNOSIS — J189 Pneumonia, unspecified organism: Secondary | ICD-10-CM | POA: Diagnosis not present

## 2015-03-18 DIAGNOSIS — K259 Gastric ulcer, unspecified as acute or chronic, without hemorrhage or perforation: Secondary | ICD-10-CM | POA: Diagnosis not present

## 2015-03-18 DIAGNOSIS — I4891 Unspecified atrial fibrillation: Secondary | ICD-10-CM | POA: Diagnosis not present

## 2015-03-18 DIAGNOSIS — S81802D Unspecified open wound, left lower leg, subsequent encounter: Secondary | ICD-10-CM | POA: Diagnosis not present

## 2015-03-22 DIAGNOSIS — I4891 Unspecified atrial fibrillation: Secondary | ICD-10-CM | POA: Diagnosis not present

## 2015-03-22 DIAGNOSIS — S81802D Unspecified open wound, left lower leg, subsequent encounter: Secondary | ICD-10-CM | POA: Diagnosis not present

## 2015-03-22 DIAGNOSIS — K259 Gastric ulcer, unspecified as acute or chronic, without hemorrhage or perforation: Secondary | ICD-10-CM | POA: Diagnosis not present

## 2015-03-22 DIAGNOSIS — J189 Pneumonia, unspecified organism: Secondary | ICD-10-CM | POA: Diagnosis not present

## 2015-03-22 DIAGNOSIS — K2970 Gastritis, unspecified, without bleeding: Secondary | ICD-10-CM | POA: Diagnosis not present

## 2015-03-22 DIAGNOSIS — J984 Other disorders of lung: Secondary | ICD-10-CM | POA: Diagnosis not present

## 2015-03-25 DIAGNOSIS — M545 Low back pain: Secondary | ICD-10-CM | POA: Diagnosis not present

## 2015-03-25 DIAGNOSIS — K2970 Gastritis, unspecified, without bleeding: Secondary | ICD-10-CM | POA: Diagnosis not present

## 2015-03-25 DIAGNOSIS — L8991 Pressure ulcer of unspecified site, stage 1: Secondary | ICD-10-CM | POA: Diagnosis not present

## 2015-03-25 DIAGNOSIS — S81802D Unspecified open wound, left lower leg, subsequent encounter: Secondary | ICD-10-CM | POA: Diagnosis not present

## 2015-03-25 DIAGNOSIS — I4891 Unspecified atrial fibrillation: Secondary | ICD-10-CM | POA: Diagnosis not present

## 2015-03-25 DIAGNOSIS — K259 Gastric ulcer, unspecified as acute or chronic, without hemorrhage or perforation: Secondary | ICD-10-CM | POA: Diagnosis not present

## 2015-03-25 DIAGNOSIS — J984 Other disorders of lung: Secondary | ICD-10-CM | POA: Diagnosis not present

## 2015-03-25 DIAGNOSIS — J189 Pneumonia, unspecified organism: Secondary | ICD-10-CM | POA: Diagnosis not present

## 2015-03-29 DIAGNOSIS — J189 Pneumonia, unspecified organism: Secondary | ICD-10-CM | POA: Diagnosis not present

## 2015-03-29 DIAGNOSIS — J984 Other disorders of lung: Secondary | ICD-10-CM | POA: Diagnosis not present

## 2015-03-29 DIAGNOSIS — S81802D Unspecified open wound, left lower leg, subsequent encounter: Secondary | ICD-10-CM | POA: Diagnosis not present

## 2015-03-29 DIAGNOSIS — K259 Gastric ulcer, unspecified as acute or chronic, without hemorrhage or perforation: Secondary | ICD-10-CM | POA: Diagnosis not present

## 2015-03-29 DIAGNOSIS — K2970 Gastritis, unspecified, without bleeding: Secondary | ICD-10-CM | POA: Diagnosis not present

## 2015-03-29 DIAGNOSIS — I4891 Unspecified atrial fibrillation: Secondary | ICD-10-CM | POA: Diagnosis not present

## 2015-03-31 ENCOUNTER — Telehealth: Payer: Self-pay | Admitting: Internal Medicine

## 2015-03-31 NOTE — Telephone Encounter (Signed)
Thank Kennon Holter  for xmas card

## 2015-04-01 NOTE — Telephone Encounter (Signed)
Called and spoke to pt and thanked her for card. Pt also requested to verify appt time and date, confirmed with pt her upcoming appt with TP on 04/12/15. Nothing further needed at this time.

## 2015-04-03 LAB — AFB CULTURE WITH SMEAR (NOT AT ARMC): Acid Fast Smear: NONE SEEN

## 2015-04-06 DIAGNOSIS — S81802D Unspecified open wound, left lower leg, subsequent encounter: Secondary | ICD-10-CM | POA: Diagnosis not present

## 2015-04-06 DIAGNOSIS — K259 Gastric ulcer, unspecified as acute or chronic, without hemorrhage or perforation: Secondary | ICD-10-CM | POA: Diagnosis not present

## 2015-04-06 DIAGNOSIS — J189 Pneumonia, unspecified organism: Secondary | ICD-10-CM | POA: Diagnosis not present

## 2015-04-06 DIAGNOSIS — J984 Other disorders of lung: Secondary | ICD-10-CM | POA: Diagnosis not present

## 2015-04-06 DIAGNOSIS — I4891 Unspecified atrial fibrillation: Secondary | ICD-10-CM | POA: Diagnosis not present

## 2015-04-06 DIAGNOSIS — K2970 Gastritis, unspecified, without bleeding: Secondary | ICD-10-CM | POA: Diagnosis not present

## 2015-04-07 ENCOUNTER — Other Ambulatory Visit: Payer: Self-pay | Admitting: Cardiovascular Disease

## 2015-04-08 DIAGNOSIS — K922 Gastrointestinal hemorrhage, unspecified: Secondary | ICD-10-CM | POA: Diagnosis not present

## 2015-04-08 LAB — HEMOGLOBIN AND HEMATOCRIT, BLOOD
HCT: 34.5 % — ABNORMAL LOW (ref 36.0–46.0)
Hemoglobin: 11 g/dL — ABNORMAL LOW (ref 12.0–15.0)

## 2015-04-12 ENCOUNTER — Other Ambulatory Visit: Payer: Self-pay

## 2015-04-12 ENCOUNTER — Ambulatory Visit: Payer: Medicare Other | Admitting: Adult Health

## 2015-04-12 MED ORDER — IRBESARTAN 150 MG PO TABS
150.0000 mg | ORAL_TABLET | Freq: Every evening | ORAL | Status: DC
Start: 2015-04-12 — End: 2016-02-20

## 2015-04-12 NOTE — Telephone Encounter (Signed)
Colleen Serge, NP at 03/14/2015 8:04 AM  irbesartan (AVAPRO) 150 MG tablet Take 1 tablet (150 mg total) by mouth every eve 3. HTN, now much lower than her usual on lopressor 75 and amlodipine 10 and avapro 300, will decrease the Avapro to give her more BP to see if this helps her fatigue/weakness.   Patient Instructions     Medication Instructions:  Your physician has recommended you make the following change in your medication:  REDUCE Avapro to '150mg'$  daily

## 2015-04-13 ENCOUNTER — Ambulatory Visit (HOSPITAL_COMMUNITY)
Admission: RE | Admit: 2015-04-13 | Discharge: 2015-04-13 | Disposition: A | Discharge status: Home / Self Care | Payer: Medicare Other | Source: Ambulatory Visit | Attending: Adult Health | Admitting: Adult Health

## 2015-04-13 DIAGNOSIS — R590 Localized enlarged lymph nodes: Secondary | ICD-10-CM | POA: Insufficient documentation

## 2015-04-13 DIAGNOSIS — Z8701 Personal history of pneumonia (recurrent): Secondary | ICD-10-CM | POA: Diagnosis not present

## 2015-04-13 DIAGNOSIS — K259 Gastric ulcer, unspecified as acute or chronic, without hemorrhage or perforation: Secondary | ICD-10-CM | POA: Diagnosis not present

## 2015-04-13 DIAGNOSIS — J90 Pleural effusion, not elsewhere classified: Secondary | ICD-10-CM | POA: Insufficient documentation

## 2015-04-13 DIAGNOSIS — J984 Other disorders of lung: Secondary | ICD-10-CM | POA: Diagnosis not present

## 2015-04-13 DIAGNOSIS — S81802D Unspecified open wound, left lower leg, subsequent encounter: Secondary | ICD-10-CM | POA: Diagnosis not present

## 2015-04-13 DIAGNOSIS — I2510 Atherosclerotic heart disease of native coronary artery without angina pectoris: Secondary | ICD-10-CM | POA: Insufficient documentation

## 2015-04-13 DIAGNOSIS — R918 Other nonspecific abnormal finding of lung field: Secondary | ICD-10-CM | POA: Diagnosis not present

## 2015-04-13 DIAGNOSIS — K2970 Gastritis, unspecified, without bleeding: Secondary | ICD-10-CM | POA: Diagnosis not present

## 2015-04-13 DIAGNOSIS — J189 Pneumonia, unspecified organism: Secondary | ICD-10-CM | POA: Diagnosis not present

## 2015-04-13 DIAGNOSIS — I4891 Unspecified atrial fibrillation: Secondary | ICD-10-CM | POA: Diagnosis not present

## 2015-04-14 NOTE — Progress Notes (Signed)
Quick Note:  Attempted to call pt. Unable to LVM, due to no VM set up. ______

## 2015-04-15 ENCOUNTER — Other Ambulatory Visit: Payer: Self-pay | Admitting: Cardiovascular Disease

## 2015-04-15 MED ORDER — PRAVASTATIN SODIUM 40 MG PO TABS: ORAL_TABLET | ORAL | Status: DC

## 2015-04-19 DIAGNOSIS — H26491 Other secondary cataract, right eye: Secondary | ICD-10-CM | POA: Diagnosis not present

## 2015-04-19 DIAGNOSIS — Z961 Presence of intraocular lens: Secondary | ICD-10-CM | POA: Diagnosis not present

## 2015-04-20 DIAGNOSIS — J189 Pneumonia, unspecified organism: Secondary | ICD-10-CM | POA: Diagnosis not present

## 2015-04-20 DIAGNOSIS — I4891 Unspecified atrial fibrillation: Secondary | ICD-10-CM | POA: Diagnosis not present

## 2015-04-20 DIAGNOSIS — K259 Gastric ulcer, unspecified as acute or chronic, without hemorrhage or perforation: Secondary | ICD-10-CM | POA: Diagnosis not present

## 2015-04-20 DIAGNOSIS — K2970 Gastritis, unspecified, without bleeding: Secondary | ICD-10-CM | POA: Diagnosis not present

## 2015-04-20 DIAGNOSIS — S81802D Unspecified open wound, left lower leg, subsequent encounter: Secondary | ICD-10-CM | POA: Diagnosis not present

## 2015-04-20 DIAGNOSIS — J984 Other disorders of lung: Secondary | ICD-10-CM | POA: Diagnosis not present

## 2015-04-22 DIAGNOSIS — I739 Peripheral vascular disease, unspecified: Secondary | ICD-10-CM | POA: Diagnosis not present

## 2015-04-22 DIAGNOSIS — S81802D Unspecified open wound, left lower leg, subsequent encounter: Secondary | ICD-10-CM | POA: Diagnosis not present

## 2015-04-22 DIAGNOSIS — R918 Other nonspecific abnormal finding of lung field: Secondary | ICD-10-CM | POA: Diagnosis not present

## 2015-04-22 DIAGNOSIS — G8929 Other chronic pain: Secondary | ICD-10-CM | POA: Diagnosis not present

## 2015-04-22 DIAGNOSIS — Z7902 Long term (current) use of antithrombotics/antiplatelets: Secondary | ICD-10-CM | POA: Diagnosis not present

## 2015-04-22 DIAGNOSIS — J189 Pneumonia, unspecified organism: Secondary | ICD-10-CM | POA: Diagnosis not present

## 2015-04-22 DIAGNOSIS — D649 Anemia, unspecified: Secondary | ICD-10-CM | POA: Diagnosis not present

## 2015-04-22 DIAGNOSIS — Z79891 Long term (current) use of opiate analgesic: Secondary | ICD-10-CM | POA: Diagnosis not present

## 2015-04-22 DIAGNOSIS — Z8673 Personal history of transient ischemic attack (TIA), and cerebral infarction without residual deficits: Secondary | ICD-10-CM | POA: Diagnosis not present

## 2015-04-22 DIAGNOSIS — Z853 Personal history of malignant neoplasm of breast: Secondary | ICD-10-CM | POA: Diagnosis not present

## 2015-04-22 DIAGNOSIS — M810 Age-related osteoporosis without current pathological fracture: Secondary | ICD-10-CM | POA: Diagnosis not present

## 2015-04-22 DIAGNOSIS — I10 Essential (primary) hypertension: Secondary | ICD-10-CM | POA: Diagnosis not present

## 2015-04-22 DIAGNOSIS — Z792 Long term (current) use of antibiotics: Secondary | ICD-10-CM | POA: Diagnosis not present

## 2015-04-22 DIAGNOSIS — M549 Dorsalgia, unspecified: Secondary | ICD-10-CM | POA: Diagnosis not present

## 2015-04-22 DIAGNOSIS — D329 Benign neoplasm of meninges, unspecified: Secondary | ICD-10-CM | POA: Diagnosis not present

## 2015-04-22 DIAGNOSIS — K259 Gastric ulcer, unspecified as acute or chronic, without hemorrhage or perforation: Secondary | ICD-10-CM | POA: Diagnosis not present

## 2015-04-22 DIAGNOSIS — I2510 Atherosclerotic heart disease of native coronary artery without angina pectoris: Secondary | ICD-10-CM | POA: Diagnosis not present

## 2015-04-22 DIAGNOSIS — Z87891 Personal history of nicotine dependence: Secondary | ICD-10-CM | POA: Diagnosis not present

## 2015-04-22 DIAGNOSIS — Z7901 Long term (current) use of anticoagulants: Secondary | ICD-10-CM | POA: Diagnosis not present

## 2015-04-22 DIAGNOSIS — E46 Unspecified protein-calorie malnutrition: Secondary | ICD-10-CM | POA: Diagnosis not present

## 2015-04-22 DIAGNOSIS — Z951 Presence of aortocoronary bypass graft: Secondary | ICD-10-CM | POA: Diagnosis not present

## 2015-04-22 DIAGNOSIS — J984 Other disorders of lung: Secondary | ICD-10-CM | POA: Diagnosis not present

## 2015-04-22 DIAGNOSIS — I4891 Unspecified atrial fibrillation: Secondary | ICD-10-CM | POA: Diagnosis not present

## 2015-04-22 DIAGNOSIS — K2970 Gastritis, unspecified, without bleeding: Secondary | ICD-10-CM | POA: Diagnosis not present

## 2015-04-27 ENCOUNTER — Other Ambulatory Visit: Payer: Self-pay | Admitting: Cardiovascular Disease

## 2015-04-27 DIAGNOSIS — I4891 Unspecified atrial fibrillation: Secondary | ICD-10-CM | POA: Diagnosis not present

## 2015-04-27 DIAGNOSIS — J189 Pneumonia, unspecified organism: Secondary | ICD-10-CM | POA: Diagnosis not present

## 2015-04-27 DIAGNOSIS — J984 Other disorders of lung: Secondary | ICD-10-CM | POA: Diagnosis not present

## 2015-04-27 DIAGNOSIS — S81802D Unspecified open wound, left lower leg, subsequent encounter: Secondary | ICD-10-CM | POA: Diagnosis not present

## 2015-04-27 DIAGNOSIS — K259 Gastric ulcer, unspecified as acute or chronic, without hemorrhage or perforation: Secondary | ICD-10-CM | POA: Diagnosis not present

## 2015-04-27 DIAGNOSIS — K2970 Gastritis, unspecified, without bleeding: Secondary | ICD-10-CM | POA: Diagnosis not present

## 2015-04-27 NOTE — Telephone Encounter (Signed)
This needs to be refilled by patient's PCP

## 2015-04-29 ENCOUNTER — Ambulatory Visit (INDEPENDENT_AMBULATORY_CARE_PROVIDER_SITE_OTHER)
Admission: RE | Admit: 2015-04-29 | Discharge: 2015-04-29 | Disposition: A | Discharge status: Home / Self Care | Payer: Medicare Other | Source: Ambulatory Visit | Attending: Adult Health | Admitting: Adult Health

## 2015-04-29 ENCOUNTER — Encounter: Payer: Self-pay | Admitting: Adult Health

## 2015-04-29 ENCOUNTER — Ambulatory Visit (INDEPENDENT_AMBULATORY_CARE_PROVIDER_SITE_OTHER): Payer: Medicare Other | Admitting: Adult Health

## 2015-04-29 VITALS — BP 118/64 | HR 61 | Temp 97.6°F | Ht 65.0 in | Wt 95.0 lb

## 2015-04-29 DIAGNOSIS — K922 Gastrointestinal hemorrhage, unspecified: Secondary | ICD-10-CM

## 2015-04-29 DIAGNOSIS — R918 Other nonspecific abnormal finding of lung field: Secondary | ICD-10-CM

## 2015-04-29 DIAGNOSIS — J984 Other disorders of lung: Secondary | ICD-10-CM | POA: Diagnosis not present

## 2015-04-29 DIAGNOSIS — J189 Pneumonia, unspecified organism: Secondary | ICD-10-CM

## 2015-04-29 NOTE — Assessment & Plan Note (Signed)
Clinically improving  CXR is slowly clearing  Will need follow up CXR on return .   Consider CT chest in 3-4 months

## 2015-04-29 NOTE — Progress Notes (Signed)
Subjective:    Patient ID: [name], female    DOB: Apr 02, 1941, 75 y.o.   MRN: [id number]  HPI  75 year old female with significant neuromuscular disability due to prior stroke, spinal issues.    STUDIES:  CT chest (05/2014) Masslike area of consolidation RLL, r hilar and mediastinal LAN CT chest (06/2014) Significant improvement of RLL opacification, cavitary lesion persists suggestive of resolving PNA with necrosis.  CT chest (09/2014) Near complete collapse of the right lower lobe cavitary lesion with residual pleural-parenchymal thickening. New fine ground-glass nodules in the right middle lobe consistent with pulmonary infection. CT chest (02/15/2015) Recurrent to rounded masslike area of consolidation in the right lower lobe. Given that this area was present previously then resolved and has recurrent quickly, this is most compatible with rounded pneumonia. Small right pleural effusion with adjacent right lower lobe atelectasis or consolidation. Mildly enlarged precarinal lymph node, likely reactive.  04/29/2015 Follow up : Cavitary PNA  Pt returns for  6 week  follow up .  She is slowly getting better, stronger each day.  Feels her breathing has improved.  CXR today shows significant improved RLL infiltrate .  Admitted in Nov 2016 with acute GI bleed and cavitary lung lesion. EGD showed a normal esophagus and gastritis with numerous clean, linear ulcers of the stomach and a few ulcers of the duodenal bulb. She did stabilize and was transitioned to Eliquis '5mg'$  Twice daily   Prior to discharge CT chest showed a recurrent rounded masslike area of consolidation on the right lower lobe measuring 9.5 x 6.7 cm.  She was treated with aggressive ABX .  We discussed she is on Fosamax , suggested that with recent GIB and gastric ulcers that this should be held until discuss with PCP .   She denies any bleeding , n/v/d , fever or chest pain.    Review of Systems  Constitutional:   No   weight loss, night sweats,  Fevers, chills,  +fatigue, or  lassitude.  HEENT:   No headaches,  Difficulty swallowing,  Tooth/dental problems, or  Sore throat,                No sneezing, itching, ear ache, +nasal congestion, post nasal drip  CV:  No chest pain,  Orthopnea, PND, swelling in lower extremities, anasarca, dizziness, palpitations, syncope.   GI  No heartburn, indigestion, abdominal pain, nausea, vomiting, diarrhea, change in bowel habits, loss of appetite, bloody stools.   Resp:.  No chest wall deformity  Skin: no rash or lesions.  GU: no dysuria, change in color of urine, no urgency or frequency.  No flank pain, no hematuria   MS:  No joint pain or swelling.  No decreased range of motion.  No back pain.  Psych:  No change in mood or affect. No depression or anxiety.  No memory loss.         Objective:   Physical Exam   GEN: A/Ox3; pleasant , NAD, frail and elderly in wheelchair  HEENT:  [place]/AT,  EACs-clear, TMs-wnl, NOSE-clear, THROAT-clear, no lesions, no postnasal drip or exudate noted.   NECK:  Supple w/ fair ROM; no JVD; normal carotid impulses w/o bruits; no thyromegaly or nodules palpated; no lymphadenopathy.  RESP  few scattered rhonchi .no accessory muscle use, no dullness to percussion  CARD:  RRR, no m/r/g  , no peripheral edema, pulses intact, no cyanosis or clubbing.  GI:   Soft & nt; nml bowel sounds; no organomegaly or masses  detected.  Musco: Warm bil, no deformities or joint swelling noted.   Neuro: alert, no focal deficits noted.    Skin: Warm, no lesions or rashes       Assessment & Plan:

## 2015-04-29 NOTE — Patient Instructions (Addendum)
Hold Fosamax for now , discuss with primary MD possible alternatives that are safer for stomach.  Hold fish oil for 2 weeks then restart but place in freezer.  Follow up Dr. Chase Caller in 6 weeks with chest xray and As needed   Please contact office for sooner follow up if symptoms do not improve or worsen or seek emergency care

## 2015-04-29 NOTE — Assessment & Plan Note (Signed)
Hx of GIB  Hold Fosamax for now , discuss with primary MD possible alternatives that are safer for stomach.  Follow up Dr. Chase Caller in 6 weeks with chest xray and As needed   Please contact office for sooner follow up if symptoms do not improve or worsen or seek emergency care

## 2015-05-03 ENCOUNTER — Ambulatory Visit (HOSPITAL_COMMUNITY)
Admission: RE | Admit: 2015-05-03 | Discharge: 2015-05-03 | Disposition: A | Discharge status: Home / Self Care | Payer: Medicare Other | Source: Ambulatory Visit | Attending: Ophthalmology | Admitting: Ophthalmology

## 2015-05-03 ENCOUNTER — Encounter (HOSPITAL_COMMUNITY)
Admission: RE | Disposition: A | Discharge status: Home / Self Care | Payer: Self-pay | Source: Ambulatory Visit | Attending: Ophthalmology

## 2015-05-03 DIAGNOSIS — M1991 Primary osteoarthritis, unspecified site: Secondary | ICD-10-CM | POA: Diagnosis not present

## 2015-05-03 DIAGNOSIS — H26491 Other secondary cataract, right eye: Secondary | ICD-10-CM | POA: Diagnosis not present

## 2015-05-03 DIAGNOSIS — Z79899 Other long term (current) drug therapy: Secondary | ICD-10-CM | POA: Insufficient documentation

## 2015-05-03 DIAGNOSIS — I10 Essential (primary) hypertension: Secondary | ICD-10-CM | POA: Insufficient documentation

## 2015-05-03 DIAGNOSIS — E7800 Pure hypercholesterolemia, unspecified: Secondary | ICD-10-CM | POA: Diagnosis not present

## 2015-05-03 DIAGNOSIS — H2640 Unspecified secondary cataract: Secondary | ICD-10-CM | POA: Insufficient documentation

## 2015-05-03 HISTORY — PX: YAG LASER APPLICATION: SHX6189

## 2015-05-03 SURGERY — TREATMENT, USING YAG LASER
Anesthesia: LOCAL | Laterality: Right

## 2015-05-03 MED ORDER — TROPICAMIDE 1 % OP SOLN
1.0000 [drp] | OPHTHALMIC | Status: AC
  Administered 2015-05-03 (×3): 1 [drp] via OPHTHALMIC

## 2015-05-03 MED ORDER — TROPICAMIDE 1 % OP SOLN
OPHTHALMIC | Status: AC
  Filled 2015-05-03: qty 3

## 2015-05-03 NOTE — Op Note (Signed)
Colleen Guster [name], MD  Procedure: Yag Capsulotomy  Yag Laser Self Test Completedyes. Procedure: Posterior Capsulotomy, Eye Protection Worn by Staff yes. Laser In Use Sign on Door yes.  Laser: Nd:YAG Spot Size: Fixed Burst Mode: III Power Setting: 3.4 mJ/burst Number of shots: 15 Total energy delivered: 49.52 mJ   The patient tolerated the procedure without difficulty. No complications were encountered.   The patient was discharged home with the instructions to continue all her current glaucoma medications, if any.   Patient instructed to go to office at 0100 for intraocular pressure check.  Patient verbalizes understanding of discharge instructions Yes.  .   Pre-Operative Diagnosis: After-Cataract, obscuring vision, 366.53 OD Post-Operative Diagnosis: After-Cataract, obscuring vision, 366.53 OD Date of Cataract Surgery: 09/19/2010

## 2015-05-03 NOTE — H&P (Signed)
The patient was re examined and there is no change in the patients condition since the original H and P. 

## 2015-05-04 NOTE — Progress Notes (Signed)
Patient ID: [name], female   DOB: June 12, 1940, 75 y.o.   MRN: [id number]   Mckenna is seen for F/U of PAF, bruit, right subclavian steal and anticoagulatin She has known vascular disease primarily in the sublavian and carotids. She was admitted with syncope last year . She had PAF with diastolic heart failure. Her cath showed only diagonal disease with no critical major vessel disease. EF was normal. 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 She has 60-79% stenosis on left ICA. In combination with rapid atrial fibrillation this may have contributed to a brief loss of consdousness. Her diastolic CHF cleared with restoration of NSR. She was sent home on low dose amiodarone. She has had no SOB, or palpitations since being home. there have been no bleeding problems.   Reviewed duplex from [facility]  1/95  09-32%  LICA stenosis. Innominate right occlusion with steal  Reviewed labs from last week and AST/ALT much better in 50 range down from 120  Seen in ER 2/16 with cough  Given Augmentin but CT with mass in RLL ? necrotising pneumonia or cancer  Reviewed  Has lost about 15 lbs  Looks weak  Indicates breathing OK  Has cavitary lesion in lungs followed by pulmonary  IMPRESSION:  CT 09/08/11  Reviewed  1. Near complete collapse of the right lower lobe cavitary lesion with residual pleural-parenchymal thickening. 2. New fine ground-glass nodules in the right middle lobe consistent with pulmonary infection. 3. Stable pleural-parenchymal thickening in right upper lobe. 4. Stable small left lower lobe pulmonary nodules.   02/20/15  Hospitalized with GI Bleed  ASA stopped and coumadin changed to eliquis per Dr Debara Pickett  Acute GI bleed: Patient presented with hemoglobin of 6.2, INR above 10 at the time of admission. - Patient was admitted to stepdown unit, transfused 4 units packed RBCs and 5 units of FFP through hospitalization. - Patient was  on aspirin, Plavix, warfarin prior to admission. - Gastroenterology was consulted. Patient underwent endoscopy on 11/9 which showed normal esophagus, 5cm HH, gastritis w/ numerous clean based linear ulcers of stomach, and a few clean based ulcers in the duodenal bulb -Continue Protonix twice a day as recommended by gastroenterology, avoid NSAIDs. Meloxicam discontinued. -H pylori screen negative    ROS: Denies fever, malais, weight loss, blurry vision, decreased visual acuity, cough, sputum, SOB, hemoptysis, pleuritic pain, palpitaitons, heartburn, abdominal pain, melena, lower extremity edema, claudication, or rash.  All other systems reviewed and negative  General: Affect appropriate Chronically ill female  HEENT: normal Neck supple with no adenopathy JVP normal bilateral subclavian bruits and carotid  bruits no thyromegaly Lungs rhonchi decreased BS right base no  wheezing and good diaphragmatic motion Heart:  S1/S2 no murmur, no rub, gallop or click PMI normal Abdomen: benighn, BS positve, no tenderness, no AAA no bruit.  No HSM or HJR Distal pulses intact with no bruits No edema Neuro non-focal Skin warm and dry bruising  No muscular weakness   Current Outpatient Prescriptions  Medication Sig Dispense Refill  . albuterol (PROVENTIL HFA;VENTOLIN HFA) 108 (90 BASE) MCG/ACT inhaler Inhale 1-2 puffs into the lungs every 6 (six) hours as needed for wheezing or shortness of breath.    Marland Kitchen amiodarone (PACERONE) 200 MG tablet Take 1 tablet by mouth  daily 30 tablet 6  . amLODipine (NORVASC) 10 MG tablet Take 10 mg by mouth every evening.     Marland Kitchen apixaban (ELIQUIS) 5  MG TABS tablet Take 1 tablet (5 mg total) by mouth 2 (two) times daily. 180 tablet 3  . calcium-vitamin D (OSCAL) 250-125 MG-UNIT per tablet Take 1 tablet by mouth 2 (two) times daily.      . colesevelam (WELCHOL) 625 MG tablet Take 1,875 mg by mouth 2 (two) times daily with a meal.    . ferrous sulfate 325 (65 FE) MG tablet  Take 325 mg by mouth daily with breakfast.    . HYDROcodone-acetaminophen (NORCO/VICODIN) 5-325 MG tablet Take 1 tablet by mouth every 6 (six) hours as needed for moderate pain. 20 tablet 0  . irbesartan (AVAPRO) 150 MG tablet Take 1 tablet (150 mg total) by mouth every evening. 90 tablet 1  . magnesium oxide (MAG-OX) 400 MG tablet Take 400 mg by mouth every morning.     . metoprolol tartrate (LOPRESSOR) 25 MG tablet Take 3 tablets by mouth two times daily 180 tablet 3  . Multiple Vitamin (MULTIVITAMIN WITH MINERALS) TABS tablet Take 1 tablet by mouth every morning.    . naloxegol oxalate (MOVANTIK) 25 MG TABS tablet Take 25 mg by mouth daily as needed (constipation).     . Omega-3 Fatty Acids (FISH OIL) 1200 MG CAPS Take 1,200 mg by mouth 2 (two) times daily.    Marland Kitchen oxyCODONE-acetaminophen (PERCOCET) 7.5-325 MG per tablet Take 1 tablet by mouth at bedtime as needed. For pain    . pantoprazole (PROTONIX) 40 MG tablet Take 1 tablet (40 mg total) by mouth 2 (two) times daily. 60 tablet 3  . pravastatin (PRAVACHOL) 20 MG tablet Take 20 mg by mouth daily.    . vitamin C (ASCORBIC ACID) 500 MG tablet Take 500 mg by mouth every morning.      No current facility-administered medications for this visit.    Allergies  Iodine; Iohexol; and Tape  Electrocardiogram:  05/21/14  SR PR 276  LVH    Assessment and Plan Vascular: right innominate occlusion with steal moderate Left ICA disease.  F/u [facility] PAF:  Continue eliquis lower to 2.5 bid if any further bleeding issues  Chol:  On statin.  Labs with primary  HTN:  On ARB well controlled  Pulmonary:  F/u CT in 3/17  Cavitary lung lesion not cancer Edema:  Reviewed ABI's and venous duplex9/16 no DVT and circulation fine mild lymphedema GI Bleed:  ASA stopped continue protonix  Lab Results  Component Value Date   HCT 34.5* 04/08/2015    F/u with me in 6 months   Jenkins Rouge

## 2015-05-05 ENCOUNTER — Encounter (HOSPITAL_COMMUNITY): Payer: Self-pay | Admitting: Ophthalmology

## 2015-05-05 DIAGNOSIS — S81802D Unspecified open wound, left lower leg, subsequent encounter: Secondary | ICD-10-CM | POA: Diagnosis not present

## 2015-05-05 DIAGNOSIS — J189 Pneumonia, unspecified organism: Secondary | ICD-10-CM | POA: Diagnosis not present

## 2015-05-05 DIAGNOSIS — I4891 Unspecified atrial fibrillation: Secondary | ICD-10-CM | POA: Diagnosis not present

## 2015-05-05 DIAGNOSIS — K259 Gastric ulcer, unspecified as acute or chronic, without hemorrhage or perforation: Secondary | ICD-10-CM | POA: Diagnosis not present

## 2015-05-05 DIAGNOSIS — J984 Other disorders of lung: Secondary | ICD-10-CM | POA: Diagnosis not present

## 2015-05-05 DIAGNOSIS — K2970 Gastritis, unspecified, without bleeding: Secondary | ICD-10-CM | POA: Diagnosis not present

## 2015-05-10 ENCOUNTER — Ambulatory Visit (INDEPENDENT_AMBULATORY_CARE_PROVIDER_SITE_OTHER): Payer: Medicare Other | Admitting: Cardiovascular Disease

## 2015-05-10 ENCOUNTER — Encounter: Payer: Self-pay | Admitting: Cardiovascular Disease

## 2015-05-10 VITALS — BP 120/64 | HR 70 | Ht 65.0 in | Wt 90.4 lb

## 2015-05-10 DIAGNOSIS — I4891 Unspecified atrial fibrillation: Secondary | ICD-10-CM | POA: Diagnosis not present

## 2015-05-10 MED ORDER — PRAVASTATIN SODIUM 20 MG PO TABS: 20.0000 mg | ORAL_TABLET | Freq: Every day | ORAL | Status: DC

## 2015-05-10 NOTE — Patient Instructions (Signed)
Medication Instructions:  Your physician recommends that you continue on your current medications as directed. Please refer to the Current Medication list given to you today.  Labwork: NONE  Testing/Procedures: NONE  Follow-Up: Your physician wants you to follow-up in: 6 month with [name]. You will receive a reminder letter in the mail two months in advance. If you don't receive a letter, please call our office to schedule the follow-up appointment.   If you need a refill on your cardiac medications before your next appointment, please call your pharmacy.

## 2015-05-12 DIAGNOSIS — K259 Gastric ulcer, unspecified as acute or chronic, without hemorrhage or perforation: Secondary | ICD-10-CM | POA: Diagnosis not present

## 2015-05-12 DIAGNOSIS — J984 Other disorders of lung: Secondary | ICD-10-CM | POA: Diagnosis not present

## 2015-05-12 DIAGNOSIS — S81802D Unspecified open wound, left lower leg, subsequent encounter: Secondary | ICD-10-CM | POA: Diagnosis not present

## 2015-05-12 DIAGNOSIS — J189 Pneumonia, unspecified organism: Secondary | ICD-10-CM | POA: Diagnosis not present

## 2015-05-12 DIAGNOSIS — I4891 Unspecified atrial fibrillation: Secondary | ICD-10-CM | POA: Diagnosis not present

## 2015-05-12 DIAGNOSIS — K2970 Gastritis, unspecified, without bleeding: Secondary | ICD-10-CM | POA: Diagnosis not present

## 2015-05-19 DIAGNOSIS — I4891 Unspecified atrial fibrillation: Secondary | ICD-10-CM | POA: Diagnosis not present

## 2015-05-19 DIAGNOSIS — S81802D Unspecified open wound, left lower leg, subsequent encounter: Secondary | ICD-10-CM | POA: Diagnosis not present

## 2015-05-19 DIAGNOSIS — K2970 Gastritis, unspecified, without bleeding: Secondary | ICD-10-CM | POA: Diagnosis not present

## 2015-05-19 DIAGNOSIS — K259 Gastric ulcer, unspecified as acute or chronic, without hemorrhage or perforation: Secondary | ICD-10-CM | POA: Diagnosis not present

## 2015-05-19 DIAGNOSIS — J984 Other disorders of lung: Secondary | ICD-10-CM | POA: Diagnosis not present

## 2015-05-19 DIAGNOSIS — J189 Pneumonia, unspecified organism: Secondary | ICD-10-CM | POA: Diagnosis not present

## 2015-05-24 ENCOUNTER — Encounter (HOSPITAL_COMMUNITY)
Admission: RE | Disposition: A | Discharge status: Home / Self Care | Payer: Self-pay | Source: Ambulatory Visit | Attending: Ophthalmology

## 2015-05-24 ENCOUNTER — Encounter (HOSPITAL_COMMUNITY): Payer: Self-pay | Admitting: *Deleted

## 2015-05-24 ENCOUNTER — Ambulatory Visit (HOSPITAL_COMMUNITY)
Admission: RE | Admit: 2015-05-24 | Discharge: 2015-05-24 | Disposition: A | Discharge status: Home / Self Care | Payer: Medicare Other | Source: Ambulatory Visit | Attending: Ophthalmology | Admitting: Ophthalmology

## 2015-05-24 DIAGNOSIS — Z8673 Personal history of transient ischemic attack (TIA), and cerebral infarction without residual deficits: Secondary | ICD-10-CM | POA: Insufficient documentation

## 2015-05-24 DIAGNOSIS — H26492 Other secondary cataract, left eye: Secondary | ICD-10-CM | POA: Diagnosis not present

## 2015-05-24 DIAGNOSIS — Z79899 Other long term (current) drug therapy: Secondary | ICD-10-CM | POA: Diagnosis not present

## 2015-05-24 DIAGNOSIS — M1991 Primary osteoarthritis, unspecified site: Secondary | ICD-10-CM | POA: Diagnosis not present

## 2015-05-24 DIAGNOSIS — Z7901 Long term (current) use of anticoagulants: Secondary | ICD-10-CM | POA: Insufficient documentation

## 2015-05-24 DIAGNOSIS — I10 Essential (primary) hypertension: Secondary | ICD-10-CM | POA: Diagnosis not present

## 2015-05-24 HISTORY — PX: YAG LASER APPLICATION: SHX6189

## 2015-05-24 SURGERY — TREATMENT, USING YAG LASER
Anesthesia: LOCAL | Laterality: Left

## 2015-05-24 MED ORDER — TROPICAMIDE 1 % OP SOLN
OPHTHALMIC | Status: AC
  Filled 2015-05-24: qty 3

## 2015-05-24 MED ORDER — TROPICAMIDE 1 % OP SOLN
1.0000 [drp] | OPHTHALMIC | Status: AC
  Administered 2015-05-24 (×2): 1 [drp] via OPHTHALMIC

## 2015-05-24 NOTE — H&P (Signed)
The patient was re examined and there is no change in the patients condition since the original H and P. 

## 2015-05-24 NOTE — Op Note (Signed)
Colleen Bartolome [name], MD  Procedure: Yag Capsulotomy  Yag Laser Self Test Completedyes. Procedure: Posterior Capsulotomy, Eye Protection Worn by Staff yes. Laser In Use Sign on Door yes.  Laser: Nd:YAG Spot Size: Fixed Burst Mode: III Power Setting: 3.2 mJ/burst Number of shots: 25 Total energy delivered: 75.78 mJ   The patient tolerated the procedure without difficulty. No complications were encountered.   The patient was discharged home with the instructions to continue all her current glaucoma medications, if any.   Patient instructed to go to office at 0100 for intraocular pressure check.  Patient verbalizes understanding of discharge instructions Yes.  .    Pre-Operative Diagnosis: After-Cataract, obscuring vision, 366.53 OS Post-Operative Diagnosis: After-Cataract, obscuring vision, 366.53 OS Date of Cataract Surgery: 10/03/2010

## 2015-05-24 NOTE — Discharge Instructions (Signed)
KATHYA WILZ  05/23/2015     Instructions    Activity: No Restrictions.   Diet: Resume Diet you were on at home.   Pain Medication: Tylenol if Needed.   CONTACT YOUR DOCTOR IF YOU HAVE PAIN, REDNESS IN YOUR EYE, OR DECREASED VISION.   Follow-up with [name], MD.  05/24/2015   1:00 p.m.   Dr. Gershon Crane: [phone]       If you find that you cannot contact your physician, but feel that your signs and   Symptoms warrant a physician's attention, call the Emergency Room at   [phone] [phone].

## 2015-05-25 ENCOUNTER — Encounter (HOSPITAL_COMMUNITY): Payer: Self-pay | Admitting: Ophthalmology

## 2015-05-25 DIAGNOSIS — J984 Other disorders of lung: Secondary | ICD-10-CM | POA: Diagnosis not present

## 2015-05-25 DIAGNOSIS — K259 Gastric ulcer, unspecified as acute or chronic, without hemorrhage or perforation: Secondary | ICD-10-CM | POA: Diagnosis not present

## 2015-05-25 DIAGNOSIS — S81802D Unspecified open wound, left lower leg, subsequent encounter: Secondary | ICD-10-CM | POA: Diagnosis not present

## 2015-05-25 DIAGNOSIS — J189 Pneumonia, unspecified organism: Secondary | ICD-10-CM | POA: Diagnosis not present

## 2015-05-25 DIAGNOSIS — I4891 Unspecified atrial fibrillation: Secondary | ICD-10-CM | POA: Diagnosis not present

## 2015-05-25 DIAGNOSIS — K2970 Gastritis, unspecified, without bleeding: Secondary | ICD-10-CM | POA: Diagnosis not present

## 2015-05-26 ENCOUNTER — Other Ambulatory Visit: Payer: Self-pay | Admitting: Cardiovascular Disease

## 2015-06-01 DIAGNOSIS — J189 Pneumonia, unspecified organism: Secondary | ICD-10-CM | POA: Diagnosis not present

## 2015-06-01 DIAGNOSIS — I4891 Unspecified atrial fibrillation: Secondary | ICD-10-CM | POA: Diagnosis not present

## 2015-06-01 DIAGNOSIS — S81802D Unspecified open wound, left lower leg, subsequent encounter: Secondary | ICD-10-CM | POA: Diagnosis not present

## 2015-06-01 DIAGNOSIS — J984 Other disorders of lung: Secondary | ICD-10-CM | POA: Diagnosis not present

## 2015-06-01 DIAGNOSIS — K259 Gastric ulcer, unspecified as acute or chronic, without hemorrhage or perforation: Secondary | ICD-10-CM | POA: Diagnosis not present

## 2015-06-01 DIAGNOSIS — K2970 Gastritis, unspecified, without bleeding: Secondary | ICD-10-CM | POA: Diagnosis not present

## 2015-06-02 ENCOUNTER — Telehealth: Payer: Self-pay | Admitting: Adult Health

## 2015-06-02 NOTE — Telephone Encounter (Signed)
Called, spoke with pt.  She c/o right side achy pain only when laying down  X 1 day.  States she has no appetite and doesn't feel well.  Denies SOB, cough, chest tightness, CP, f/c/s.  Pt hasn't tried any meds for pain.  She hasn't used heat/ice to area stating "it's not that kind of pain."  States pain is in lung.  Pt was seen by TP on 04/29/15 with the following recs:  Patient Instructions     Hold Fosamax for now , discuss with primary MD possible alternatives that are safer for stomach.  Hold fish oil for 2 weeks then restart but place in freezer.  Follow up Dr. Chase Caller in 6 weeks with chest xray and As needed  Please contact office for sooner follow up if symptoms do not improve or worsen or seek emergency care    -------  Pt has a pending appt with TP next Friday, 3/3 with cxr.  She feels she need abx prior to this "before it flares into pna."  Offered OV - pt would 1st like to see if abx can be called in.  Tammy, please advise.

## 2015-06-02 NOTE — Telephone Encounter (Signed)
It is hard to say if this is an infection without cough/congestion or fever.  Pain could be many things. Would recommend she be seen , may need to see PCP if she she only has pain .  Please contact office for sooner follow up if symptoms do not improve or worsen or seek emergency care

## 2015-06-02 NOTE — Telephone Encounter (Signed)
Spoke with the pt and notified of recs per TP  She states prefers to come here rather than PCP since she feels this is PNA  OV with TP at 9:45 am

## 2015-06-03 ENCOUNTER — Ambulatory Visit (INDEPENDENT_AMBULATORY_CARE_PROVIDER_SITE_OTHER)
Admission: RE | Admit: 2015-06-03 | Discharge: 2015-06-03 | Disposition: A | Discharge status: Home / Self Care | Payer: Medicare Other | Source: Ambulatory Visit | Attending: Adult Health | Admitting: Adult Health

## 2015-06-03 ENCOUNTER — Encounter: Payer: Self-pay | Admitting: Adult Health

## 2015-06-03 ENCOUNTER — Ambulatory Visit (INDEPENDENT_AMBULATORY_CARE_PROVIDER_SITE_OTHER): Payer: Medicare Other | Admitting: Adult Health

## 2015-06-03 ENCOUNTER — Other Ambulatory Visit: Payer: Self-pay | Admitting: Cardiovascular Disease

## 2015-06-03 VITALS — BP 116/80 | HR 63 | Temp 98.0°F | Ht 64.0 in | Wt 97.0 lb

## 2015-06-03 DIAGNOSIS — J189 Pneumonia, unspecified organism: Secondary | ICD-10-CM

## 2015-06-03 DIAGNOSIS — J984 Other disorders of lung: Secondary | ICD-10-CM

## 2015-06-03 NOTE — Progress Notes (Signed)
Subjective:    Patient ID: [name], female    DOB: 1941-03-22, 75 y.o.   MRN: [id number]  HPI 75 year old female former smoker with significant neuromuscular disability due to prior stroke, spinal issues.    STUDIES:  CT chest (05/2014) Masslike area of consolidation RLL, r hilar and mediastinal LAN CT chest (06/2014) Significant improvement of RLL opacification, cavitary lesion persists suggestive of resolving PNA with necrosis.  CT chest (09/2014) Near complete collapse of the right lower lobe cavitary lesion with residual pleural-parenchymal thickening. New fine ground-glass nodules in the right middle lobe consistent with pulmonary infection. CT chest (02/15/2015) Recurrent to rounded masslike area of consolidation in the right lower lobe. Given that this area was present previously then resolved and has recurrent quickly, this is most compatible with rounded pneumonia. Small right pleural effusion with adjacent right lower lobe atelectasis or consolidation. Mildly enlarged precarinal lymph node, likely reactive. CT chest 04/13/15 >Interval improvement in masslike consolidation in the right lower lobe, without complete resolution, favoring resolving pneumonia. 2. Locule of air at the lateral base of the right hemi thorax may be within necrotic lung. Bronchopleural fistula cannot be definitively excluded. 3. Small, partially loculated right pleural effusion. 4. 4 mm left lower lobe nodule, stable.    06/03/2015 Acute OV : Cavitary PNA  Pt presents for work in visit. Was seen 1 month ago . She is recovering from recurrent cavitary PNA on the right.  Previous episode in 05/2014 with right sided mass like consolidation. She was treated with abx. Serial CT showed near complete resolution on CT chest 09/2014.  Admitted in Nov 2016 with acute GI bleed and cavitary lung lesion. EGD showed a normal esophagus and gastritis with numerous clean, linear ulcers of the stomach and a few ulcers of the  duodenal bulb. She did stabilize and was transitioned back on Eliquis '5mg'$  Twice daily   Prior to discharge CT chest showed a recurrent rounded masslike area of consolidation on the right lower lobe measuring 9.5 x 6.7 cm.  She was treated with aggressive ABX . Follow up CT chest in Jan showed interval improvement in RLL consolidation. Clinically she has been slowly improving.  She is very frail and has previous stroke years ago. She is on chronic pain meds as well for back issues.  She denies any swallow issues but does admit she eats in bed often. Says this week has not felt as good  Says that her right side was hurting when she lied down in bed and not as much energy. Feels much better today. CXR today shows further improvement in RLL opacity . No new areas noted.  She denies any bleeding , n/v/d , fever or chest pain. , orthopnea, hemoptysis , or edema.  Has ov with PCP next week for labs.  Last labs last month w/ improved H/H .    Review of Systems Constitutional:   No  weight loss, night sweats,  Fevers, chills,  +fatigue, or  lassitude.s  HEENT:   No headaches,  Difficulty swallowing,  Tooth/dental problems, or  Sore throat,                No sneezing, itching, ear ache,nasal congestion, post nasal drip  CV:  No chest pain,  Orthopnea, PND, swelling in lower extremities, anasarca, dizziness, palpitations, syncope.   GI  No heartburn, indigestion, abdominal pain, nausea, vomiting, diarrhea, change in bowel habits, loss of appetite, bloody stools.   Resp:.  No chest wall deformity  Skin: no rash or lesions.  GU: no dysuria, change in color of urine, no urgency or frequency.  No flank pain, no hematuria   MS:  No joint pain or swelling.  No decreased range of motion.  No back pain.  Psych:  No change in mood or affect. No depression or anxiety.  No memory loss.         Objective:   Physical Exam  Filed Vitals:   06/03/15 1002  BP: 116/80  Pulse: 63  Temp: 98 F (36.7  C)  TempSrc: Oral  Height: '5\' 4"'$  (1.626 m)  Weight: 97 lb (43.999 kg)  SpO2: 98%    GEN: A/Ox3; pleasant , NAD, frail and elderly in wheelchair  HEENT:  [place]/AT,  EACs-clear, TMs-wnl, NOSE-clear, THROAT-clear, no lesions, no postnasal drip or exudate noted.   NECK:  Supple w/ fair ROM; no JVD; normal carotid impulses w/o bruits; no thyromegaly or nodules palpated; no lymphadenopathy.  RESP  few scattered rhonchi .no accessory muscle use, no dullness to percussion  CARD:  RRR, no m/r/g  , no peripheral edema, pulses intact, no cyanosis or clubbing.  GI:   Soft & nt; nml bowel sounds; no organomegaly or masses detected.  Musco: Warm bil, no deformities or joint swelling noted. Brace on left leg.   Neuro: alert, no focal deficits noted.    Skin: Warm, no lesions or rashes   06/03/2015 CXR reviewed and agree with radiology interpretation    Mild further improvement of right lower lobe airspace opacity with decreased size of small right pleural effusion. Persistent small loculated right basilar effusion. 2. Persistent right basilar opacity likely reflecting a combination of residual airspace opacity and fissural fluid. Continued follow-up to resolution is recommended.     Assessment & Plan:

## 2015-06-03 NOTE — Assessment & Plan Note (Signed)
Recurrent right sided cavitary PNA , shows gradual clearing on cxr and CT  Needs follow up CT next month as planned.  ? Aspiration issues with previous stroke, age and chronic pain med use.  Set up for MBS w/ ST .   Plan  Follow up for CT chest in 1 month as planned Set pu for Modified Barium Swallow, may do this at [facility].  Do not eat 3 hrs before bedtime , do not lie down after eating .  Follow up with Dr. Chase Caller in 6 weeks with PFT.  Please contact office for sooner follow up if symptoms do not improve or worsen or seek emergency care  Incentive spirometry during datyime. Marland Kitchen

## 2015-06-03 NOTE — Patient Instructions (Addendum)
Follow up for CT chest in 1 month as planned Set pu for Modified Barium Swallow, may do this at [facility].  Do not eat 3 hrs before bedtime , do not lie down after eating .  Follow up with Dr. Chase Caller in 6 weeks with PFT.  Please contact office for sooner follow up if symptoms do not improve or worsen or seek emergency care  Incentive spirometry during datyime. Marland Kitchen

## 2015-06-06 ENCOUNTER — Other Ambulatory Visit: Payer: Self-pay | Admitting: Adult Health

## 2015-06-06 DIAGNOSIS — R1310 Dysphagia, unspecified: Secondary | ICD-10-CM

## 2015-06-07 DIAGNOSIS — J984 Other disorders of lung: Secondary | ICD-10-CM | POA: Diagnosis not present

## 2015-06-07 DIAGNOSIS — J189 Pneumonia, unspecified organism: Secondary | ICD-10-CM | POA: Diagnosis not present

## 2015-06-07 DIAGNOSIS — I4891 Unspecified atrial fibrillation: Secondary | ICD-10-CM | POA: Diagnosis not present

## 2015-06-07 DIAGNOSIS — K259 Gastric ulcer, unspecified as acute or chronic, without hemorrhage or perforation: Secondary | ICD-10-CM | POA: Diagnosis not present

## 2015-06-07 DIAGNOSIS — S81802D Unspecified open wound, left lower leg, subsequent encounter: Secondary | ICD-10-CM | POA: Diagnosis not present

## 2015-06-07 DIAGNOSIS — K2970 Gastritis, unspecified, without bleeding: Secondary | ICD-10-CM | POA: Diagnosis not present

## 2015-06-08 ENCOUNTER — Encounter (INDEPENDENT_AMBULATORY_CARE_PROVIDER_SITE_OTHER): Payer: Self-pay | Admitting: Internal Medicine

## 2015-06-08 ENCOUNTER — Ambulatory Visit (INDEPENDENT_AMBULATORY_CARE_PROVIDER_SITE_OTHER): Payer: Medicare Other | Admitting: Internal Medicine

## 2015-06-08 VITALS — BP 104/60 | HR 64 | Temp 98.9°F | Ht 64.0 in | Wt 99.3 lb

## 2015-06-08 DIAGNOSIS — K922 Gastrointestinal hemorrhage, unspecified: Secondary | ICD-10-CM | POA: Diagnosis not present

## 2015-06-08 DIAGNOSIS — R509 Fever, unspecified: Secondary | ICD-10-CM | POA: Diagnosis not present

## 2015-06-08 LAB — CBC WITH DIFFERENTIAL/PLATELET
BASOS ABS: 0 10*3/uL (ref 0.0–0.1)
BASOS PCT: 0 % (ref 0–1)
EOS ABS: 0 10*3/uL (ref 0.0–0.7)
EOS PCT: 0 % (ref 0–5)
HCT: 32.7 % — ABNORMAL LOW (ref 36.0–46.0)
Hemoglobin: 10.4 g/dL — ABNORMAL LOW (ref 12.0–15.0)
LYMPHS ABS: 1.8 10*3/uL (ref 0.7–4.0)
Lymphocytes Relative: 9 % — ABNORMAL LOW (ref 12–46)
MCH: 28.8 pg (ref 26.0–34.0)
MCHC: 31.8 g/dL (ref 30.0–36.0)
MCV: 90.6 fL (ref 78.0–100.0)
MPV: 9.3 fL (ref 8.6–12.4)
Monocytes Absolute: 2 10*3/uL — ABNORMAL HIGH (ref 0.1–1.0)
Monocytes Relative: 10 % (ref 3–12)
NEUTROS PCT: 81 % — AB (ref 43–77)
Neutro Abs: 16.4 10*3/uL — ABNORMAL HIGH (ref 1.7–7.7)
PLATELETS: 511 10*3/uL — AB (ref 150–400)
RBC: 3.61 MIL/uL — ABNORMAL LOW (ref 3.87–5.11)
RDW: 16.4 % — AB (ref 11.5–15.5)
WBC: 20.3 10*3/uL — AB (ref 4.0–10.5)

## 2015-06-08 NOTE — Patient Instructions (Signed)
CBC today. OV 6 months.

## 2015-06-08 NOTE — Progress Notes (Addendum)
Subjective:    Patient ID: [name], female    DOB: 03/12/1941, 75 y.o.   MRN: [id number]  HPI   HPI Here today for f/u after recent UGI bleed. Admitted to [facility] 02/14/2015. Apparently had blood works by her PCP on 02/11/2015 and her hemoglobin came back at 8.3. She started having black stools and rectal bleeding and her husband took her to the ED. She was transferred to [facility]. Hemoglobin at [facility] was 6.2 She received PRBCs and FFP while admitted. She underwent an EGD by [facility] (see below). H. Pylori was negative. Patient had been on Plavix and Warfarin for CAD and A. Fib.   She also had been taking Mobic and ASA also for chronic back pain which has now been d/c.  Presently taking Eliquis and Plavix per husband. Patient did not bring her medications in. I have asked her husband to call me her medications. She tells me she has had a low grade fever and is afraid she has pneumonia.  She has not had any further rectal bleeding. She is having one BM daily.  No abdominal pain.  Appetite is pretty good.  She has lost about 6 pounds since her last visit in December.   02/16/2015 EGD: GI Bleed: Dr. [place] [facility] Normal esophagus. 5cm hiatal hernia. Gastritis with numerous clean based ulcers of the stomach with a few cleaned based ulcers in the duodenal bulb.   CBC    Component Value Date/Time   WBC 24.7* 03/14/2015 1230   WBC 8.4 08/11/2010   RBC 3.69* 03/14/2015 1230   RBC 2.50* 05/21/2014 0933   HGB 11.0* 04/08/2015 0843   HCT 34.5* 04/08/2015 0843   PLT 614* 03/14/2015 1230   MCV 85.6 03/14/2015 1230   MCH 29.0 03/14/2015 1230   MCHC 33.9 03/14/2015 1230   RDW 14.8 03/14/2015 1230   LYMPHSABS 1.5 03/14/2015 1230   MONOABS 3.0* 03/14/2015 1230   EOSABS 0.0 03/14/2015 1230   BASOSABS 0.0 03/14/2015 1230      Review of Systems Past Medical History  Diagnosis Date  . Subclavian steal syndrome   . Carotid bruit     LICA 35-36% (duplex 1/44)  .  Hypertension   . Hyperlipidemia   . Atrial fibrillation ([place])     x 3 yrs  . Meningioma ([place])   . Osteoporosis   . Stroke [facility])     CVA 2008  . Pneumonia May 19, 2014  . Breast cancer ([place])     S/P left mastectomy and chemotherapy 1989 remained in remission  . Anemia 05/2014.  . Carotid artery occlusion   . Ulcers of both lower extremities ([place]) 2015  . Degenerative arthritis of spine 2015    Chronic back pain    Past Surgical History  Procedure Laterality Date  . Cardiac catheterization    . Mastectomy Left 1990  . Cataract extraction Bilateral 2013  . Colonoscopy  11/17/2010    Procedure: COLONOSCOPY;  Surgeon: [name], MD;  [facility];  Service: Endoscopy;  Laterality: N/A;  10:45 am  . Esophagogastroduodenoscopy  11/17/2010    Procedure: ESOPHAGOGASTRODUODENOSCOPY (EGD);  Surgeon: [name], MD;  Location: [facility];  Service: Endoscopy;  Laterality: N/A;  . Lumbar epidural injection  06-2012--06-2013    pt. states she has had 5 epidurals in 06-2012----06-2013  . Eye surgery    . Exploratory laparotomy  1960s    For peritonitis of undetermined cause  . Esophagogastroduodenoscopy N/A  02/16/2015    Procedure: ESOPHAGOGASTRODUODENOSCOPY (EGD);  Surgeon: [name], MD;  Location: [facility];  Service: Gastroenterology;  Laterality: N/A;  . Yag laser application Right 7/54/4920    Procedure: YAG LASER APPLICATION;  Surgeon: [name], MD;  Location: [facility] OR[facility];  Service: Ophthalmology;  Laterality: Right;  right  . Yag laser application Left 1/00/7121    Procedure: YAG LASER APPLICATION;  Surgeon: [name], MD;  Location: [facility];  Service: Ophthalmology;  Laterality: Left;    Allergies  Allergen Reactions  . Iodine Rash and Other (See Comments)    BETADINE Rash/burning, blisters on skin.  . Iohexol Rash and Other (See Comments)    Blisters; PT NEEDS 13-HOUR PREP   . Tape Itching and Rash    Prefers PAPER TAPE     Current Outpatient Prescriptions on File Prior to Visit  Medication Sig Dispense Refill  . albuterol (PROVENTIL HFA;VENTOLIN HFA) 108 (90 BASE) MCG/ACT inhaler Inhale 1-2 puffs into the lungs every 6 (six) hours as needed for wheezing or shortness of breath.    Marland Kitchen amiodarone (PACERONE) 200 MG tablet Take 1 tablet by mouth  daily 90 tablet 3  . amLODipine (NORVASC) 10 MG tablet Take 10 mg by mouth every evening.     Marland Kitchen apixaban (ELIQUIS) 5 MG TABS tablet Take 1 tablet (5 mg total) by mouth 2 (two) times daily. 180 tablet 3  . calcium-vitamin D (OSCAL) 250-125 MG-UNIT per tablet Take 1 tablet by mouth 2 (two) times daily.      . colesevelam (WELCHOL) 625 MG tablet Take 1,875 mg by mouth 2 (two) times daily with a meal.    . ferrous sulfate 325 (65 FE) MG tablet Take 325 mg by mouth daily with breakfast.    . HYDROcodone-acetaminophen (NORCO/VICODIN) 5-325 MG tablet Take 1 tablet by mouth every 6 (six) hours as needed for moderate pain. 20 tablet 0  . irbesartan (AVAPRO) 150 MG tablet Take 1 tablet (150 mg total) by mouth every evening. 90 tablet 1  . magnesium oxide (MAG-OX) 400 MG tablet Take 400 mg by mouth every morning.     . metoprolol tartrate (LOPRESSOR) 25 MG tablet Take 3 tablets by mouth two times daily 540 tablet 3  . Multiple Vitamin (MULTIVITAMIN WITH MINERALS) TABS tablet Take 1 tablet by mouth every morning.    . naloxegol oxalate (MOVANTIK) 25 MG TABS tablet Take 25 mg by mouth daily as needed (constipation).     . Omega-3 Fatty Acids (FISH OIL) 1200 MG CAPS Take 1,200 mg by mouth 2 (two) times daily.    Marland Kitchen oxyCODONE-acetaminophen (PERCOCET) 7.5-325 MG per tablet Take 1 tablet by mouth at bedtime as needed. For pain    . pantoprazole (PROTONIX) 40 MG tablet Take 1 tablet (40 mg total) by mouth 2 (two) times daily. (Patient taking differently: Take 40 mg by mouth daily. ) 60 tablet 3  . pravastatin (PRAVACHOL) 20 MG tablet Take 1 tablet (20 mg total) by mouth daily. 90 tablet 3  .  vitamin C (ASCORBIC ACID) 500 MG tablet Take 500 mg by mouth every morning.      No current facility-administered medications on file prior to visit.        Objective:   Physical Exam Blood pressure 104/60, pulse 64, temperature 98.9 F (37.2 C), height '5\' 4"'$  (1.626 m), weight 99 lb 4.8 oz (45.042 kg).  Alert and oriented. Skin warm and dry. Oral mucosa is moist.   . Sclera anicteric, conjunctivae is pink.  Thyroid not enlarged. No cervical lymphadenopathy. Lungs clear. Heart regular rate and rhythm.  Abdomen is soft. Bowel sounds are positive. No hepatomegaly. No abdominal masses felt. No tenderness.  No edema to lower extremities.        Assessment & Plan:  UGI bleeding in setting of anticoagulant therapy. Presently taking Plavix and Eliquis. Husband will call me later to be sure she is on these two medications. OV in 6 months. CBC today

## 2015-06-08 NOTE — Progress Notes (Signed)
Subjective:    Patient ID: [name], female    DOB: 02/11/1941, 75 y.o.   MRN: [id number]  HPI   HPI Here today for f/u after recent UGI bleed. Admitted to [facility] 02/14/2015. Apparently had blood works by her PCP on 02/11/2015 and her hemoglobin came back at 8.3. She started having black stools and rectal bleeding and her husband took her to the ED. She was transferred to [facility]. She received PRBCs and FFP while admitted. She underwent an EGD by [place] Cellar (see below). H. Pylori was negative. Patient had been on Plavix and Warfarin for CAD and A. Fib.   She also had been taking Mobic and ASA also for chronic back pain which has now been d/c.  Presently taking Eliquis and Plavix per husband. Patient did not bring her medications in. I have asked her husband to call me her medications. She tells me she has had a low grade fever and is afraid she has pneumonia.  She has not had any further rectal bleeding. She is having one BM daily.  No abdominal pain.  Appetite is pretty good.  She has lost about 6 pounds since her last visit in December.   02/16/2015 EGD: GI Bleed: Dr. [place] Cellar Normal esophagus. 5cm hiatal hernia. Gastritis with numerous clean based ulcers of the stomach with a few cleaned based ulcers in the duodenal bulb.   CBC    Component Value Date/Time   WBC 24.7* 03/14/2015 1230   WBC 8.4 08/11/2010   RBC 3.69* 03/14/2015 1230   RBC 2.50* 05/21/2014 0933   HGB 11.0* 04/08/2015 0843   HCT 34.5* 04/08/2015 0843   PLT 614* 03/14/2015 1230   MCV 85.6 03/14/2015 1230   MCH 29.0 03/14/2015 1230   MCHC 33.9 03/14/2015 1230   RDW 14.8 03/14/2015 1230   LYMPHSABS 1.5 03/14/2015 1230   MONOABS 3.0* 03/14/2015 1230   EOSABS 0.0 03/14/2015 1230   BASOSABS 0.0 03/14/2015 1230      Review of Systems Past Medical History  Diagnosis Date  . Subclavian steal syndrome   . Carotid bruit     LICA 24-09% (duplex 7/35)  . Hypertension   . Hyperlipidemia     . Atrial fibrillation ([place])     x 3 yrs  . Meningioma ([place])   . Osteoporosis   . Stroke [facility])     CVA 2008  . Pneumonia May 19, 2014  . Breast cancer ([place])     S/P left mastectomy and chemotherapy 1989 remained in remission  . Anemia 05/2014.  . Carotid artery occlusion   . Ulcers of both lower extremities ([place]) 2015  . Degenerative arthritis of spine 2015    Chronic back pain    Past Surgical History  Procedure Laterality Date  . Cardiac catheterization    . Mastectomy Left 1990  . Cataract extraction Bilateral 2013  . Colonoscopy  11/17/2010    Procedure: COLONOSCOPY;  Surgeon: [name], MD;  [facility];  Service: Endoscopy;  Laterality: N/A;  10:45 am  . Esophagogastroduodenoscopy  11/17/2010    Procedure: ESOPHAGOGASTRODUODENOSCOPY (EGD);  Surgeon: [name], MD;  [facility];  Service: Endoscopy;  Laterality: N/A;  . Lumbar epidural injection  06-2012--06-2013    pt. states she has had 5 epidurals in 06-2012----06-2013  . Eye surgery    . Exploratory laparotomy  1960s    For peritonitis of undetermined cause  . Esophagogastroduodenoscopy N/A 02/16/2015  Procedure: ESOPHAGOGASTRODUODENOSCOPY (EGD);  Surgeon: [name], MD;  Location: [place];  Service: Gastroenterology;  Laterality: N/A;  . Yag laser application Right 06/07/6008    Procedure: YAG LASER APPLICATION;  Surgeon: [name], MD;  Location: [facility];  Service: Ophthalmology;  Laterality: Right;  right  . Yag laser application Left 9/32/3557    Procedure: YAG LASER APPLICATION;  Surgeon: [name], MD;  Location: [facility];  Service: Ophthalmology;  Laterality: Left;    Allergies  Allergen Reactions  . Iodine Rash and Other (See Comments)    BETADINE Rash/burning, blisters on skin.  . Iohexol Rash and Other (See Comments)    Blisters; PT NEEDS 13-HOUR PREP   . Tape Itching and Rash    Prefers PAPER TAPE    Current Outpatient Prescriptions on  File Prior to Visit  Medication Sig Dispense Refill  . albuterol (PROVENTIL HFA;VENTOLIN HFA) 108 (90 BASE) MCG/ACT inhaler Inhale 1-2 puffs into the lungs every 6 (six) hours as needed for wheezing or shortness of breath.    Marland Kitchen amiodarone (PACERONE) 200 MG tablet Take 1 tablet by mouth  daily 90 tablet 3  . amLODipine (NORVASC) 10 MG tablet Take 10 mg by mouth every evening.     Marland Kitchen apixaban (ELIQUIS) 5 MG TABS tablet Take 1 tablet (5 mg total) by mouth 2 (two) times daily. 180 tablet 3  . calcium-vitamin D (OSCAL) 250-125 MG-UNIT per tablet Take 1 tablet by mouth 2 (two) times daily.      . colesevelam (WELCHOL) 625 MG tablet Take 1,875 mg by mouth 2 (two) times daily with a meal.    . ferrous sulfate 325 (65 FE) MG tablet Take 325 mg by mouth daily with breakfast.    . HYDROcodone-acetaminophen (NORCO/VICODIN) 5-325 MG tablet Take 1 tablet by mouth every 6 (six) hours as needed for moderate pain. 20 tablet 0  . irbesartan (AVAPRO) 150 MG tablet Take 1 tablet (150 mg total) by mouth every evening. 90 tablet 1  . magnesium oxide (MAG-OX) 400 MG tablet Take 400 mg by mouth every morning.     . metoprolol tartrate (LOPRESSOR) 25 MG tablet Take 3 tablets by mouth two times daily 540 tablet 3  . Multiple Vitamin (MULTIVITAMIN WITH MINERALS) TABS tablet Take 1 tablet by mouth every morning.    . naloxegol oxalate (MOVANTIK) 25 MG TABS tablet Take 25 mg by mouth daily as needed (constipation).     . Omega-3 Fatty Acids (FISH OIL) 1200 MG CAPS Take 1,200 mg by mouth 2 (two) times daily.    Marland Kitchen oxyCODONE-acetaminophen (PERCOCET) 7.5-325 MG per tablet Take 1 tablet by mouth at bedtime as needed. For pain    . pantoprazole (PROTONIX) 40 MG tablet Take 1 tablet (40 mg total) by mouth 2 (two) times daily. (Patient taking differently: Take 40 mg by mouth daily. ) 60 tablet 3  . pravastatin (PRAVACHOL) 20 MG tablet Take 1 tablet (20 mg total) by mouth daily. 90 tablet 3  . vitamin C (ASCORBIC ACID) 500 MG tablet  Take 500 mg by mouth every morning.      No current facility-administered medications on file prior to visit.        Objective:   Physical Exam Blood pressure 104/60, pulse 64, temperature 98.9 F (37.2 C), height '5\' 4"'$  (1.626 m), weight 99 lb 4.8 oz (45.042 kg).  Alert and oriented. Skin warm and dry. Oral mucosa is moist.   . Sclera anicteric, conjunctivae is pink. Thyroid not enlarged. No  cervical lymphadenopathy. Lungs clear. Heart regular rate and rhythm.  Abdomen is soft. Bowel sounds are positive. No hepatomegaly. No abdominal masses felt. No tenderness.  No edema to lower extremities.        Assessment & Plan:  UGI bleeding in setting of anticoagulant therapy. Presently taking Plavix and Eliquis. Husband will call me later to be sure she is on these two medications. OV in 6 months. CBC today

## 2015-06-09 ENCOUNTER — Ambulatory Visit (HOSPITAL_COMMUNITY)
Admission: RE | Admit: 2015-06-09 | Discharge: 2015-06-09 | Disposition: A | Discharge status: Home / Self Care | Payer: Medicare Other | Source: Ambulatory Visit | Attending: Adult Health | Admitting: Adult Health

## 2015-06-09 ENCOUNTER — Other Ambulatory Visit (HOSPITAL_COMMUNITY): Payer: Self-pay | Admitting: Internal Medicine

## 2015-06-09 ENCOUNTER — Ambulatory Visit (HOSPITAL_COMMUNITY): Payer: Medicare Other | Attending: Adult Health | Admitting: Speech Pathology

## 2015-06-09 DIAGNOSIS — R1310 Dysphagia, unspecified: Secondary | ICD-10-CM | POA: Insufficient documentation

## 2015-06-09 DIAGNOSIS — I639 Cerebral infarction, unspecified: Secondary | ICD-10-CM | POA: Diagnosis not present

## 2015-06-09 DIAGNOSIS — Z1231 Encounter for screening mammogram for malignant neoplasm of breast: Secondary | ICD-10-CM

## 2015-06-09 DIAGNOSIS — J189 Pneumonia, unspecified organism: Secondary | ICD-10-CM | POA: Diagnosis not present

## 2015-06-09 NOTE — Therapy (Signed)
[place], [place], [address] Phone: [phone]   Fax:  [phone]  Modified Barium Swallow  Patient Details  Name: [name] MRN: [id number] Date of Birth: 01/04/1941 No Data Recorded  Encounter Date: 06/09/2015      End of Session - 06/09/15 1951    Visit Number 1   Number of Visits 1   Authorization Type Medicare   SLP Start Time 1310   SLP Stop Time  7106   SLP Time Calculation (min) 33 min   Activity Tolerance Patient tolerated treatment well      Past Medical History  Diagnosis Date  . Subclavian steal syndrome   . Carotid bruit     LICA 26-94% (duplex 8/54)  . Hypertension   . Hyperlipidemia   . Atrial fibrillation ([place])     x 3 yrs  . Meningioma ([place])   . Osteoporosis   . Stroke [facility])     CVA 2008  . Pneumonia May 19, 2014  . Breast cancer ([place])     S/P left mastectomy and chemotherapy 1989 remained in remission  . Anemia 05/2014.  . Carotid artery occlusion   . Ulcers of both lower extremities ([place]) 2015  . Degenerative arthritis of spine 2015    Chronic back pain    Past Surgical History  Procedure Laterality Date  . Cardiac catheterization    . Mastectomy Left 1990  . Cataract extraction Bilateral 2013  . Colonoscopy  11/17/2010    Procedure: COLONOSCOPY;  Surgeon: [name], MD;  [facility];  Service: Endoscopy;  Laterality: N/A;  10:45 am  . Esophagogastroduodenoscopy  11/17/2010    Procedure: ESOPHAGOGASTRODUODENOSCOPY (EGD);  Surgeon: [name], MD;  [facility];  Service: Endoscopy;  Laterality: N/A;  . Lumbar epidural injection  06-2012--06-2013    pt. states she has had 5 epidurals in 06-2012----06-2013  . Eye surgery    . Exploratory laparotomy  1960s    For peritonitis of undetermined cause  . Esophagogastroduodenoscopy N/A 02/16/2015    Procedure: ESOPHAGOGASTRODUODENOSCOPY (EGD);  Surgeon: [name], MD;   Location: [place];  Service: Gastroenterology;  Laterality: N/A;  . Yag laser application Right 10/03/348    Procedure: YAG LASER APPLICATION;  Surgeon: [name], MD;  [facility];  Service: Ophthalmology;  Laterality: Right;  right  . Yag laser application Left 0/93/8182    Procedure: YAG LASER APPLICATION;  Surgeon: [name], MD;  [facility];  Service: Ophthalmology;  Laterality: Left;    There were no vitals filed for this visit.  Visit Diagnosis: Dysphagia   75 year old female former smoker with significant neuromuscular disability due to prior stroke, spinal issues.  STUDIES:   CT chest (05/2014) Masslike area of consolidation RLL, r hilar and mediastinal LAN CT chest (06/2014) Significant improvement of RLL opacification, cavitary lesion persists suggestive of resolving PNA with necrosis.   CT chest (09/2014) Near complete collapse of the right lower lobe cavitary lesion with residual pleural-parenchymal thickening. New fine ground-glass nodules in the right middle lobe consistent with pulmonary infection. CT chest (02/15/2015) Recurrent to rounded masslike area of consolidation in the right lower lobe. Given that this area was present previously then resolved and has recurrent quickly, this is most compatible with rounded pneumonia. Small right pleural effusion with adjacent right lower lobe atelectasis or consolidation. Mildly enlarged precarinal lymph node, likely reactive. CT chest 04/13/15 >Interval improvement in masslike consolidation in the  right lower lobe, without complete resolution, favoring resolving pneumonia. 2. Locule of air at the lateral base of the right hemi thorax may be within necrotic lung. Bronchopleural fistula cannot be definitively excluded. 3. Small, partially loculated right pleural effusion. 4. 4 mm left lower lobe nodule, stable.  06/03/2015 CXR: Mild further improvement of right lower lobe airspace opacity with decreased size of small  right pleural effusion. Persistent small loculated right basilar effusion. 2. Persistent right basilar opacity likely reflecting a combination of residual airspace opacity and fissural fluid. Continued follow-up to resolution is recommended.  02/16/2015 EGD: GI Bleed: Dr. [place] Cellar Normal esophagus. 5cm hiatal hernia. Gastritis with numerous clean based ulcers of the stomach with a few cleaned based ulcers in the duodenal bulb.         Subjective Assessment - 06/09/15 1946    Subjective "I don't have trouble swallowing, but I keep getting pneumonia."   Patient is accompained by: Family member   Special Tests MBSS   Currently in Pain? No/denies           General - 06/09/15 1947    General Information   Date of Onset 06/03/15   HPI [name] is a 75 yo woman who was referred by her pulmonary team for MBSS   Type of Study MBS-Modified Barium Swallow Study   Previous Swallow Assessment None on record   Diet Prior to this Study Regular;Thin liquids   Temperature Spikes Noted No   Respiratory Status Room air   History of Recent Intubation No   Behavior/Cognition Alert;Cooperative;Pleasant mood   Oral Cavity Assessment Within Functional Limits   Oral Care Completed by SLP No   Oral Cavity - Dentition Adequate natural dentition  has a partial that she does not wear very much   Vision Functional for self feeding   Self-Feeding Abilities Able to feed self  tremor   Patient Positioning Upright in chair   Baseline Vocal Quality Normal   Volitional Cough Strong   Volitional Swallow Able to elicit   Anatomy Within functional limits   Pharyngeal Secretions Not observed secondary MBS            Oral Preparation/Oral Phase - 06/09/15 1949    Oral Preparation/Oral Phase   Oral Phase Within functional limits   Electrical stimulation - Oral Phase   Was Electrical Stimulation Used No          Pharyngeal Phase - 06/09/15 1949    Pharyngeal Phase   Pharyngeal  Phase Impaired   Pharyngeal - Thin   Pharyngeal- Thin Straw Swallow initiation at vallecula;Penetration/Apiration after swallow;Pharyngeal residue - pyriform   Pharyngeal Material does not enter airway;Material enters airway, remains ABOVE vocal cords then ejected out   Pharyngeal - Solids   Pharyngeal- Puree Within functional limits   Pharyngeal- Regular Within functional limits   Pharyngeal- Pill Within functional limits   Electrical Stimulation - Pharyngeal Phase   Was Electrical Stimulation Used No          Cricopharyngeal Phase - 06/09/15 1950    Cervical Esophageal Phase   Cervical Esophageal Phase Within functional limits  Evidence of small hiatal hernia during esophageal sweep confirmed by Radiologist           Plan - 06/09/15 1951    Clinical Impression Statement [name] presents with mild pharyngeal phase dysphagia characterized by pooling in the pyriforms after the swallow with thin liquids, likely due to use of straw (residuals trickle down are not sensed well  once in the pyriforms). Residuals in pyriforms are minimal, however could be considered a risk for aspiration. She had one episode where residuals started to spill towards arytenoids into laryngeal vestibule, however pt immediately cleared her throat and material did not reach vocal folds/aspiration not visualized. Given history of frequent, unexplained pneumonia episodes, SLP instructed pt and husband on aspiration precautions anc compensatory strategies to minimize risk of aspiration. Pt cued to swallow 2x for each sip and clear throat periodically with repeat swallow when consuming liquids to help clear pyriforms. Pt verbalized understanding and written handout provided.           G-Codes - 06/30/2015 1958    Functional Assessment Tool Used MBSS; clinical judgment   Functional Limitations Swallowing   Swallow Current Status [phone]) At least 1 percent but less than 20 percent impaired, limited or restricted    Swallow Goal Status (Y0737) At least 1 percent but less than 20 percent impaired, limited or restricted   Swallow Discharge Status [phone]) At least 1 percent but less than 20 percent impaired, limited or restricted          Recommendations/Treatment - Jun 30, 2015 1950    Swallow Evaluation Recommendations   SLP Diet Recommendations Thin;Age appropriate regular   Liquid Administration via Straw   Medication Administration Whole meds with liquid   Supervision Patient able to self feed   Compensations Multiple dry swallows after each bite/sip;Clear throat intermittently   Postural Changes Seated upright at 90 degrees;Remain upright for at least 30 minutes after feeds/meals          Prognosis - 06-30-2015 1950    Prognosis   Prognosis for Safe Diet Advancement Good   Individuals Consulted   Consulted and Agree with Results and Recommendations Patient;Family member/caregiver   Family Member Consulted Husband   Report Sent to  Referring physician  pt request report to Dr. Hall/PCP as well      Problem List Patient Active Problem List   Diagnosis Date Noted  . Multiple gastric ulcers   . Coagulopathy ([place])   . Bleeding gastrointestinal   . Acute GI bleeding   . CAD in native artery   . Supratherapeutic INR   . Right lower lobe lung mass   . Cavitary lesion of lung   . Acute renal failure ([place])   . SIRS (systemic inflammatory response syndrome) ([facility])   . Chronic back pain   . GI bleed 02/14/2015  . Carotid artery disease ([place]) 02/14/2015  . Leukocytosis 02/14/2015  . Lung nodules 09/29/2014  . Smoking history 09/29/2014  . Pneumonia with cavity of lung 07/09/2014  . Lung mass 07/09/2014  . Thrombocytosis ([place]) 05/21/2014  . Protein-calorie malnutrition, severe ([place]) 05/21/2014  . Anemia   . Generalized weakness   . Necrotic pneumonia ([place]) 05/20/2014  . Pneumonia 05/20/2014  . Elevated liver enzymes 12/16/2013  . Tremor 06/24/2013  . Encounter for therapeutic drug  monitoring 05/18/2013  . Dizziness 01/12/2013  . Wound of ankle 10/23/2010  . Long term current use of anticoagulant 07/05/2010  . Carotid bruit 11/01/2009  . Hyperlipemia 10/26/2009  . Essential hypertension 10/26/2009  . Atrial fibrillation ([place]) 10/26/2009  . ACUTE ON CHRONIC DIASTOLIC HEART FAILURE 94/85/4627  . SYNCOPE AND COLLAPSE 10/26/2009   Thank you,  Genene Churn, [place]  [facility] 2015-06-30, 7:59 PM  [place] [address] [place], [place], [address] Phone: [phone]   Fax:  [phone]  Name: ANIAH PAULI MRN: [id number] Date of  Birth: 08/18/1940

## 2015-06-10 ENCOUNTER — Ambulatory Visit: Payer: Medicare Other | Admitting: Adult Health

## 2015-06-10 DIAGNOSIS — I10 Essential (primary) hypertension: Secondary | ICD-10-CM | POA: Diagnosis not present

## 2015-06-10 DIAGNOSIS — E782 Mixed hyperlipidemia: Secondary | ICD-10-CM | POA: Diagnosis not present

## 2015-06-13 NOTE — Progress Notes (Signed)
Quick Note:  Called and spoke with pt. Reviewed results and recs. Pt voiced understanding and had no further questions. ______ 

## 2015-06-14 ENCOUNTER — Telehealth (INDEPENDENT_AMBULATORY_CARE_PROVIDER_SITE_OTHER): Payer: Self-pay | Admitting: *Deleted

## 2015-06-14 DIAGNOSIS — K922 Gastrointestinal hemorrhage, unspecified: Secondary | ICD-10-CM

## 2015-06-14 DIAGNOSIS — G894 Chronic pain syndrome: Secondary | ICD-10-CM | POA: Diagnosis not present

## 2015-06-14 DIAGNOSIS — I10 Essential (primary) hypertension: Secondary | ICD-10-CM | POA: Diagnosis not present

## 2015-06-14 DIAGNOSIS — J129 Viral pneumonia, unspecified: Secondary | ICD-10-CM | POA: Diagnosis not present

## 2015-06-14 DIAGNOSIS — M816 Localized osteoporosis [Lequesne]: Secondary | ICD-10-CM | POA: Diagnosis not present

## 2015-06-14 DIAGNOSIS — K219 Gastro-esophageal reflux disease without esophagitis: Secondary | ICD-10-CM | POA: Diagnosis not present

## 2015-06-14 DIAGNOSIS — I482 Chronic atrial fibrillation: Secondary | ICD-10-CM | POA: Diagnosis not present

## 2015-06-14 DIAGNOSIS — D594 Other nonautoimmune hemolytic anemias: Secondary | ICD-10-CM | POA: Diagnosis not present

## 2015-06-14 DIAGNOSIS — E782 Mixed hyperlipidemia: Secondary | ICD-10-CM | POA: Diagnosis not present

## 2015-06-14 NOTE — Telephone Encounter (Signed)
.  Per Lelon Perla patient is to have lab in 1 month.

## 2015-06-15 ENCOUNTER — Encounter (HOSPITAL_COMMUNITY): Payer: Medicare Other

## 2015-06-16 DIAGNOSIS — J984 Other disorders of lung: Secondary | ICD-10-CM | POA: Diagnosis not present

## 2015-06-16 DIAGNOSIS — J189 Pneumonia, unspecified organism: Secondary | ICD-10-CM | POA: Diagnosis not present

## 2015-06-16 DIAGNOSIS — S81802D Unspecified open wound, left lower leg, subsequent encounter: Secondary | ICD-10-CM | POA: Diagnosis not present

## 2015-06-16 DIAGNOSIS — K2970 Gastritis, unspecified, without bleeding: Secondary | ICD-10-CM | POA: Diagnosis not present

## 2015-06-16 DIAGNOSIS — K259 Gastric ulcer, unspecified as acute or chronic, without hemorrhage or perforation: Secondary | ICD-10-CM | POA: Diagnosis not present

## 2015-06-16 DIAGNOSIS — I4891 Unspecified atrial fibrillation: Secondary | ICD-10-CM | POA: Diagnosis not present

## 2015-06-28 DIAGNOSIS — E782 Mixed hyperlipidemia: Secondary | ICD-10-CM | POA: Diagnosis not present

## 2015-06-28 DIAGNOSIS — R799 Abnormal finding of blood chemistry, unspecified: Secondary | ICD-10-CM | POA: Diagnosis not present

## 2015-06-28 DIAGNOSIS — G589 Mononeuropathy, unspecified: Secondary | ICD-10-CM | POA: Diagnosis not present

## 2015-06-28 DIAGNOSIS — I10 Essential (primary) hypertension: Secondary | ICD-10-CM | POA: Diagnosis not present

## 2015-07-04 ENCOUNTER — Encounter (HOSPITAL_COMMUNITY): Payer: Self-pay

## 2015-07-06 ENCOUNTER — Other Ambulatory Visit (INDEPENDENT_AMBULATORY_CARE_PROVIDER_SITE_OTHER): Payer: Self-pay | Admitting: *Deleted

## 2015-07-06 ENCOUNTER — Encounter (INDEPENDENT_AMBULATORY_CARE_PROVIDER_SITE_OTHER): Payer: Self-pay | Admitting: *Deleted

## 2015-07-06 DIAGNOSIS — K922 Gastrointestinal hemorrhage, unspecified: Secondary | ICD-10-CM

## 2015-07-08 ENCOUNTER — Ambulatory Visit (HOSPITAL_COMMUNITY)
Admission: RE | Admit: 2015-07-08 | Discharge: 2015-07-08 | Disposition: A | Discharge status: Home / Self Care | Payer: Medicare Other | Source: Ambulatory Visit | Attending: Adult Health | Admitting: Adult Health

## 2015-07-08 DIAGNOSIS — J189 Pneumonia, unspecified organism: Secondary | ICD-10-CM

## 2015-07-08 DIAGNOSIS — J984 Other disorders of lung: Secondary | ICD-10-CM | POA: Insufficient documentation

## 2015-07-08 LAB — PULMONARY FUNCTION TEST
DL/VA % pred: 73 %
DL/VA: 3.54 ml/min/mmHg/L
DLCO UNC % PRED: 46 %
DLCO UNC: 11.33 ml/min/mmHg
FEF 25-75 PRE: 0.84 L/s
FEF 25-75 Post: 0.5 L/sec
FEF2575-%CHANGE-POST: -39 %
FEF2575-%Pred-Post: 32 %
FEF2575-%Pred-Pre: 54 %
FEV1-%Change-Post: -8 %
FEV1-%PRED-POST: 64 %
FEV1-%Pred-Pre: 70 %
FEV1-Post: 1.31 L
FEV1-Pre: 1.44 L
FEV1FVC-%Change-Post: -2 %
FEV1FVC-%Pred-Pre: 93 %
FEV6-%CHANGE-POST: -10 %
FEV6-%PRED-POST: 71 %
FEV6-%Pred-Pre: 79 %
FEV6-Post: 1.85 L
FEV6-Pre: 2.07 L
FEV6FVC-%Change-Post: -4 %
FEV6FVC-%Pred-Post: 99 %
FEV6FVC-%Pred-Pre: 104 %
FVC-%CHANGE-POST: -6 %
FVC-%PRED-POST: 71 %
FVC-%Pred-Pre: 76 %
FVC-Post: 1.96 L
FVC-Pre: 2.09 L
POST FEV1/FVC RATIO: 67 %
PRE FEV1/FVC RATIO: 69 %
Post FEV6/FVC ratio: 95 %
Pre FEV6/FVC Ratio: 99 %
RV % pred: 135 %
RV: 3.16 L
TLC % pred: 98 %
TLC: 4.96 L

## 2015-07-08 MED ORDER — ALBUTEROL SULFATE (2.5 MG/3ML) 0.083% IN NEBU
2.5000 mg | INHALATION_SOLUTION | Freq: Once | RESPIRATORY_TRACT | Status: AC
  Administered 2015-07-08: 2.5 mg via RESPIRATORY_TRACT

## 2015-07-11 ENCOUNTER — Ambulatory Visit (HOSPITAL_COMMUNITY)
Admission: RE | Admit: 2015-07-11 | Discharge: 2015-07-11 | Disposition: A | Discharge status: Home / Self Care | Payer: Medicare Other | Source: Ambulatory Visit | Attending: Internal Medicine | Admitting: Internal Medicine

## 2015-07-11 DIAGNOSIS — R918 Other nonspecific abnormal finding of lung field: Secondary | ICD-10-CM | POA: Diagnosis not present

## 2015-07-11 DIAGNOSIS — I2510 Atherosclerotic heart disease of native coronary artery without angina pectoris: Secondary | ICD-10-CM | POA: Diagnosis not present

## 2015-07-11 DIAGNOSIS — Z720 Tobacco use: Secondary | ICD-10-CM | POA: Insufficient documentation

## 2015-07-11 DIAGNOSIS — Z87891 Personal history of nicotine dependence: Secondary | ICD-10-CM

## 2015-07-12 ENCOUNTER — Ambulatory Visit (INDEPENDENT_AMBULATORY_CARE_PROVIDER_SITE_OTHER): Payer: Medicare Other | Admitting: Internal Medicine

## 2015-07-12 ENCOUNTER — Encounter: Payer: Self-pay | Admitting: Internal Medicine

## 2015-07-12 VITALS — BP 122/68 | HR 62 | Ht 64.0 in | Wt 95.4 lb

## 2015-07-12 DIAGNOSIS — J449 Chronic obstructive pulmonary disease, unspecified: Secondary | ICD-10-CM | POA: Diagnosis not present

## 2015-07-12 MED ORDER — TIOTROPIUM BROMIDE MONOHYDRATE 2.5 MCG/ACT IN AERS: 2.0000 | INHALATION_SPRAY | Freq: Every day | RESPIRATORY_TRACT | Status: DC

## 2015-07-12 NOTE — Progress Notes (Signed)
Subjective:     Patient ID: [name], female   DOB: [birth year], 75 y.o.   MRN: [id number]  HPI  75 year old female former smoker with significant neuromuscular disability due to prior stroke, spinal issues.    STUDIES:  CT chest (05/2014) Masslike area of consolidation RLL, r hilar and mediastinal LAN CT chest (06/2014) Significant improvement of RLL opacification, cavitary lesion persists suggestive of resolving PNA with necrosis.  CT chest (09/2014) Near complete collapse of the right lower lobe cavitary lesion with residual pleural-parenchymal thickening. New fine ground-glass nodules in the right middle lobe consistent with pulmonary infection. CT chest (02/15/2015) Recurrent to rounded masslike area of consolidation in the right lower lobe. Given that this area was present previously then resolved and has recurrent quickly, this is most compatible with rounded pneumonia. Small right pleural effusion with adjacent right lower lobe atelectasis or consolidation. Mildly enlarged precarinal lymph node, likely reactive. CT chest 04/13/15 >Interval improvement in masslike consolidation in the right lower lobe, without complete resolution, favoring resolving pneumonia. 2. Locule of air at the lateral base of the right hemi thorax may be within necrotic lung. Bronchopleural fistula cannot be definitively excluded. 3. Small, partially loculated right pleural effusion. 4. 4 mm left lower lobe nodule, stable.    PCP Delphina Cahill, MD REferrd by dr Johnsie Cancel HPI  IOV 07/09/2014  Chief Complaint  Patient presents with  . Pulmonary Consult    Pt referred by [name] fpr abnormal CT. Pt denies SOB, cough, and CP/tightness. Pt stated she went to hospital for pna on 05/20/14.     75 year old female with significant neuromuscular disability due to prior stroke, spinal issues. Previous greater than 50 pack smoker. Was admitted around 05/20/2014 with significant right lower lobe pneumonia/huge masslike  appearance on CT scan of the chest. Apparently she was extensively asymptomatic at this point in time. Treated with antibiotics. She had follow-up CT scan of the chest which shows significant improvement in the right lower lobe opacity but with a residual 5.2 cm thick-walled cavitary lesion. There for she's here for follow-up. At baseline she reports no dyspnea but then she is extremely disabled and is only able to move a little bit with pushing a wheelchair and this is because of a spinal issues. She only rarely feels short of breath and manages with pro-air when necessary. She's never had lung function test in the past. She denies any cough. Currently she feels better     OV 09/29/2014  Chief Complaint  Patient presents with  . Follow-up    Pt here after CT scan. Pt states her breathing is unchanged since last OV. Pt has left ankle edema dt recent sprain, pt is seeing PCP for the issue.     Follow-up   - Right lower lobe lung cavity following pneumonia February 2016: She had CT scan of the chest June 2016. This showed near resolution of the cavity and it is down to a scar tissue. I personally visualized image dated 09/08/2014  - New issue: CT scan of the chest 09/08/2014 compared to CT scans of the chest March 2016 show some new right middle lobe groundglass nodules that are extremely small. She denies she was sick at that time  - Other issue: History of smoking with shortness of breath: She denies any shortness of breath or cough but on deeper questioning she does admit to occasional shortness of breath that is very rare. She uses pro-air may be once every 2 weeks. She feels good.  She's never had pulmonary function testing. She has a history of 56 pack smoking history but is quit. She does not want to pulmonary function test now but will do it in the future   06/03/2015 Acute OV : Cavitary PNA  Pt presents for work in visit. Was seen 1 month ago . She is recovering from recurrent cavitary  PNA on the right.  Previous episode in 05/2014 with right sided mass like consolidation. She was treated with abx. Serial CT showed near complete resolution on CT chest 09/2014.  Admitted in Nov 2016 with acute GI bleed and cavitary lung lesion. EGD showed a normal esophagus and gastritis with numerous clean, linear ulcers of the stomach and a few ulcers of the duodenal bulb. She did stabilize and was transitioned back on Eliquis '5mg'$  Twice daily   Prior to discharge CT chest showed a recurrent rounded masslike area of consolidation on the right lower lobe measuring 9.5 x 6.7 cm.  She was treated with aggressive ABX . Follow up CT chest in Jan showed interval improvement in RLL consolidation. Clinically she has been slowly improving.  She is very frail and has previous stroke years ago. She is on chronic pain meds as well for back issues.  She denies any swallow issues but does admit she eats in bed often. Says this week has not felt as good  Says that her right side was hurting when she lied down in bed and not as much energy. Feels much better today. CXR today shows further improvement in RLL opacity . No new areas noted.  She denies any bleeding , n/v/d , fever or chest pain. , orthopnea, hemoptysis , or edema.  Has ov with PCP next week for labs.  Last labs last month w/ improved H/H .    OV 07/12/2015  Chief Complaint  Patient presents with  . Follow-up    Pt last seen on 06/03/15 for pna. Pt states she is feeling well now. Pt deines SOB, cough, wheezing, CP/tightness, f/c/s.     Follow-up recurrent pneumonia. Most recently 6 weeks ago she saw nurse practitioner. She says she is now recovered from respiratory infection. She and her husband state that she gets recurrent respiratory infection and is looking at ways to prevent it. Pulmonary function test today shows gold stage II COPD. CT scan chest as a follow-up from pneumonia 2 years ago shows that things have resolved to scar tissue. There is  no lung mass. Overall she's asymptomatic from a respiratory standpoint at baseline but then she is largely disabled because of spinal issues and does not exert much.  CT and PFT were personally visualized  Pulmonary function test 07/08/2015 FEV1 1.44 L/70%. No post bronchodilator response. FVC 2.1 L/76% and a ratio 69 consistent with Gold stage II COPD. Total lung capacity is 98%. DLCO is reduced at 11.33/46%  Ct Chest Wo Contrast  07/11/2015  CLINICAL DATA:  Lung nodules. EXAM: CT CHEST WITHOUT CONTRAST TECHNIQUE: Multidetector CT imaging of the chest was performed following the standard protocol without IV contrast. COMPARISON:  CT chest exams dating back to 01/15/2008. FINDINGS: Mediastinum/Nodes: Sub cm low-attenuation lesion in the right lobe of the thyroid is incidentally noted. Mediastinal lymph nodes are not enlarged by CT size criteria. Hilar regions are difficult to definitively evaluate without IV contrast. No axillary adenopathy. Surgical clips in the left axilla. Atherosclerotic calcification of the arterial vasculature. Dense calcification and narrowing of the right brachiocephalic artery. Three-vessel coronary artery calcification. Heart size normal.  No pericardial effusion. Small hiatal hernia. Calcified subcarinal lymph node. Lungs/Pleura: Right apical pleural parenchymal scarring. Mild centrilobular emphysema. Post infectious scarring in the right middle and right lower lobes. A few scattered pulmonary nodules measure 4 mm or less in size, unchanged. Some are calcified. No pleural fluid. Airway is unremarkable. Upper abdomen: Visualized portions of the liver, adrenal glands unremarkable. Probable renal vascular calcifications bilaterally. Difficult to exclude renal stones as well. Visualized portions of the spleen, pancreas and stomach are grossly unremarkable. Musculoskeletal: Degenerative changes are seen in the spine. No worrisome lytic or sclerotic lesions. Lower thoracic compression  fractures, as before. IMPRESSION: 1. Post infectious scarring in the right middle and right lower lobes. 2. Three-vessel coronary artery calcification. 3. Advanced atherosclerotic calcification and narrowing of the proximal right brachiocephalic artery. Electronically Signed   By: Lorin Picket M.D.   On: 07/11/2015 11:25   \    has a past medical history of Subclavian steal syndrome; Carotid bruit; Hypertension; Hyperlipidemia; Atrial fibrillation ([place]); Meningioma ([place]); Osteoporosis; Stroke [facility]); Pneumonia (May 19, 2014); Breast cancer ([place]); Anemia (05/2014.); Carotid artery occlusion; Ulcers of both lower extremities ([place]) (2015); and Degenerative arthritis of spine (2015).    has past surgical history that includes Cardiac catheterization; Mastectomy (Left, 1990); Cataract extraction (Bilateral, 2013); Colonoscopy (11/17/2010); Esophagogastroduodenoscopy (11/17/2010); Lumbar epidural injection (06-2012--06-2013); Eye surgery; Exploratory laparotomy (1960s); Esophagogastroduodenoscopy (N/A, 02/16/2015); Yag laser application (Right, 05/06/7865); and Yag laser application (Left, 6/72/0947).   reports that she quit smoking about 8 years ago. Her smoking use included Cigarettes. She has a 56 pack-year smoking history. She has never used smokeless tobacco.    Current outpatient prescriptions:  .  albuterol (PROVENTIL HFA;VENTOLIN HFA) 108 (90 BASE) MCG/ACT inhaler, Inhale 1-2 puffs into the lungs every 6 (six) hours as needed for wheezing or shortness of breath., Disp: , Rfl:  .  amiodarone (PACERONE) 200 MG tablet, Take 1 tablet by mouth  daily, Disp: 90 tablet, Rfl: 3 .  amLODipine (NORVASC) 10 MG tablet, Take 10 mg by mouth every evening. , Disp: , Rfl:  .  apixaban (ELIQUIS) 5 MG TABS tablet, Take 1 tablet (5 mg total) by mouth 2 (two) times daily., Disp: 180 tablet, Rfl: 3 .  calcium-vitamin D (OSCAL) 250-125 MG-UNIT per tablet, Take 1 tablet by mouth 2 (two) times daily.  , Disp: , Rfl:   .  clopidogrel (PLAVIX) 75 MG tablet, Take 75 mg by mouth daily., Disp: , Rfl:  .  ferrous sulfate 325 (65 FE) MG tablet, Take 325 mg by mouth daily with breakfast., Disp: , Rfl:  .  HYDROcodone-acetaminophen (NORCO/VICODIN) 5-325 MG tablet, Take 1 tablet by mouth every 6 (six) hours as needed for moderate pain., Disp: 20 tablet, Rfl: 0 .  irbesartan (AVAPRO) 150 MG tablet, Take 1 tablet (150 mg total) by mouth every evening., Disp: 90 tablet, Rfl: 1 .  magnesium oxide (MAG-OX) 400 MG tablet, Take 400 mg by mouth every morning. , Disp: , Rfl:  .  metoprolol tartrate (LOPRESSOR) 25 MG tablet, Take 3 tablets by mouth two times daily, Disp: 540 tablet, Rfl: 3 .  Multiple Vitamin (MULTIVITAMIN WITH MINERALS) TABS tablet, Take 1 tablet by mouth every morning., Disp: , Rfl:  .  naloxegol oxalate (MOVANTIK) 25 MG TABS tablet, Take 25 mg by mouth daily as needed (constipation). , Disp: , Rfl:  .  oxyCODONE-acetaminophen (PERCOCET) 7.5-325 MG per tablet, Take 1 tablet by mouth at bedtime as needed. For pain, Disp: , Rfl:  .  pantoprazole (  PROTONIX) 40 MG tablet, Take 1 tablet (40 mg total) by mouth 2 (two) times daily. (Patient taking differently: Take 40 mg by mouth daily. ), Disp: 60 tablet, Rfl: 3 .  pravastatin (PRAVACHOL) 20 MG tablet, Take 1 tablet (20 mg total) by mouth daily., Disp: 90 tablet, Rfl: 3 .  vitamin C (ASCORBIC ACID) 500 MG tablet, Take 500 mg by mouth every morning. , Disp: , Rfl:    Allergies  Allergen Reactions  . Iodine Rash and Other (See Comments)    BETADINE Rash/burning, blisters on skin.  . Iohexol Rash and Other (See Comments)    Blisters; PT NEEDS 13-HOUR PREP   . Tape Itching and Rash    Prefers PAPER TAPE     Review of Systems     Objective:   Physical Exam  Constitutional: She is oriented to person, place, and time. No distress.  Frail female sitting in wheel chair  HENT:  Head: Normocephalic and atraumatic.  Right Ear: External ear normal.  Left Ear:  External ear normal.  Mouth/Throat: Oropharynx is clear and moist. No oropharyngeal exudate.  Eyes: Conjunctivae and EOM are normal. Pupils are equal, round, and reactive to light. Right eye exhibits no discharge. Left eye exhibits no discharge. No scleral icterus.  Neck: Normal range of motion. Neck supple. No JVD present. No tracheal deviation present. No thyromegaly present.  Cardiovascular: Normal rate, regular rhythm, normal heart sounds and intact distal pulses.  Exam reveals no gallop and no friction rub.   No murmur heard. Pulmonary/Chest: Effort normal and breath sounds normal. No respiratory distress. She has no wheezes. She has no rales. She exhibits no tenderness.  Abdominal: Soft. Bowel sounds are normal. She exhibits no distension and no mass. There is no tenderness. There is no rebound and no guarding.  Musculoskeletal: Normal range of motion. She exhibits no edema or tenderness.  kyphotic  Lymphadenopathy:    She has no cervical adenopathy.  Neurological: She is alert and oriented to person, place, and time. She has normal reflexes. No cranial nerve deficit. She exhibits normal muscle tone. Coordination normal.  Skin: Skin is warm and dry. No rash noted. She is not diaphoretic. No erythema. No pallor.  Psychiatric: She has a normal mood and affect. Her behavior is normal. Judgment and thought content normal.  Vitals reviewed.    Filed Vitals:   07/12/15 0932  BP: 122/68  Pulse: 62  Height: '5\' 4"'$  (1.626 m)  Weight: 95 lb 6.4 oz (43.273 kg)  SpO2: 95%   Estimated body mass index is 16.37 kg/(m^2) as calculated from the following:   Height as of this encounter: '5\' 4"'$  (1.626 m).   Weight as of this encounter: 95 lb 6.4 oz (43.273 kg).       Assessment:        ICD-9-CM ICD-10-CM   1. COPD, moderate ([place]) 496 J44.9        Plan:      You have moderate copd Pneumonia has resolved to scar on CT Chest No further CT chest needed Please start spiriva daily - take  sample, script and show technique - we can see if this helps improve her effort tolerance and cut down recurrent respiratory exacerbation Use albuterol as needed If you feel sick you can call us   Followup  4 months or sooner if needed   Dr. Brand Males, M.D., [facility].C.P Pulmonary and Critical Care Medicine Staff Physician [place] [facility] Pager: [phone],  If no answer or between  15:00h - 7:00h: call [phone]  07/12/2015 9:57 AM

## 2015-07-12 NOTE — Patient Instructions (Signed)
ICD-9-CM ICD-10-CM   1. COPD, moderate ([place]) 496 J44.9    You have moderate copd Pneumonia has resolved to scar on CT Chest No further CT chest needed Please start spiriva daily - take sample, script and show technique Use albuterol as needed If you feel sick you can call us   Followup  4 months or sooner if needed

## 2015-07-15 DIAGNOSIS — K922 Gastrointestinal hemorrhage, unspecified: Secondary | ICD-10-CM | POA: Diagnosis not present

## 2015-07-15 LAB — CBC
HCT: 38.7 % (ref 35.0–45.0)
HEMOGLOBIN: 12.5 g/dL (ref 11.7–15.5)
MCH: 30 pg (ref 27.0–33.0)
MCHC: 32.3 g/dL (ref 32.0–36.0)
MCV: 92.8 fL (ref 80.0–100.0)
MPV: 10.6 fL (ref 7.5–12.5)
Platelets: 293 10*3/uL (ref 140–400)
RBC: 4.17 MIL/uL (ref 3.80–5.10)
RDW: 16.6 % — AB (ref 11.0–15.0)
WBC: 6.7 10*3/uL (ref 3.8–10.8)

## 2015-07-18 ENCOUNTER — Ambulatory Visit (HOSPITAL_COMMUNITY): Payer: Medicare Other

## 2015-07-21 ENCOUNTER — Ambulatory Visit (HOSPITAL_COMMUNITY)
Admission: RE | Admit: 2015-07-21 | Discharge: 2015-07-21 | Disposition: A | Discharge status: Home / Self Care | Payer: Medicare Other | Source: Ambulatory Visit | Attending: Internal Medicine | Admitting: Internal Medicine

## 2015-07-21 DIAGNOSIS — Z1231 Encounter for screening mammogram for malignant neoplasm of breast: Secondary | ICD-10-CM

## 2015-07-25 ENCOUNTER — Telehealth (INDEPENDENT_AMBULATORY_CARE_PROVIDER_SITE_OTHER): Payer: Self-pay | Admitting: *Deleted

## 2015-07-25 DIAGNOSIS — K922 Gastrointestinal hemorrhage, unspecified: Secondary | ICD-10-CM

## 2015-07-25 NOTE — Telephone Encounter (Signed)
Per Dr.Rehman the patient will need to have labs drawn in 3 months 

## 2015-07-25 NOTE — Telephone Encounter (Signed)
Patient was called and given her lab results per NUR. She was made aware that we would repeat in 3 months and that a letter would be sent as a reminder.

## 2015-09-29 ENCOUNTER — Encounter (INDEPENDENT_AMBULATORY_CARE_PROVIDER_SITE_OTHER): Payer: Self-pay | Admitting: *Deleted

## 2015-09-29 ENCOUNTER — Other Ambulatory Visit (INDEPENDENT_AMBULATORY_CARE_PROVIDER_SITE_OTHER): Payer: Self-pay | Admitting: *Deleted

## 2015-09-29 DIAGNOSIS — K922 Gastrointestinal hemorrhage, unspecified: Secondary | ICD-10-CM

## 2015-10-12 DIAGNOSIS — E782 Mixed hyperlipidemia: Secondary | ICD-10-CM | POA: Diagnosis not present

## 2015-10-14 DIAGNOSIS — K219 Gastro-esophageal reflux disease without esophagitis: Secondary | ICD-10-CM | POA: Diagnosis not present

## 2015-10-14 DIAGNOSIS — I10 Essential (primary) hypertension: Secondary | ICD-10-CM | POA: Diagnosis not present

## 2015-10-14 DIAGNOSIS — I482 Chronic atrial fibrillation: Secondary | ICD-10-CM | POA: Diagnosis not present

## 2015-10-14 DIAGNOSIS — R799 Abnormal finding of blood chemistry, unspecified: Secondary | ICD-10-CM | POA: Diagnosis not present

## 2015-10-14 DIAGNOSIS — D594 Other nonautoimmune hemolytic anemias: Secondary | ICD-10-CM | POA: Diagnosis not present

## 2015-10-14 DIAGNOSIS — G894 Chronic pain syndrome: Secondary | ICD-10-CM | POA: Diagnosis not present

## 2015-10-14 DIAGNOSIS — E782 Mixed hyperlipidemia: Secondary | ICD-10-CM | POA: Diagnosis not present

## 2015-11-01 DIAGNOSIS — K922 Gastrointestinal hemorrhage, unspecified: Secondary | ICD-10-CM | POA: Diagnosis not present

## 2015-11-02 LAB — HEMOGLOBIN AND HEMATOCRIT, BLOOD
HCT: 38 % (ref 35.0–45.0)
Hemoglobin: 12.5 g/dL (ref 11.7–15.5)

## 2015-11-09 NOTE — Progress Notes (Signed)
Patient ID: [name], female   DOB: 01-17-1941, 75 y.o.   MRN: [id number]   [name] is seen for F/U of PAF, bruit, right subclavian steal and anticoagulatin She has known vascular disease primarily in the sublavian and carotids. She was admitted with syncope last year . She had PAF with diastolic heart failure. Her cath showed only diagonal disease with no critical major vessel disease. EF was normal. She has seen [name] in F/U She has an occluded inominate on the right and had failed previous stenting attempts. She has 60-79% stenosis on left ICA. In combination with rapid atrial fibrillation this may have contributed to a brief loss of consdousness. Her diastolic CHF cleared with restoration of NSR. She was sent home on low dose amiodarone. She has had no SOB, or palpitations since being home. there have been no bleeding problems.   Reviewed duplex from [facility]  3/54  65-68%  LICA stenosis. Innominate right occlusion with steal  Reviewed labs from last week and AST/ALT much better in 50 range down from 120  Seen in ER 2/16 with cough  Given Augmentin but CT with mass in RLL ? necrotising pneumonia or cancer  Reviewed  Has lost about 15 lbs  Looks weak  Indicates breathing OK  Has cavitary lesion in lungs followed by pulmonary  IMPRESSION:  CT 09/08/11  Reviewed  1. Near complete collapse of the right lower lobe cavitary lesion with residual pleural-parenchymal thickening. 2. New fine ground-glass nodules in the right middle lobe consistent with pulmonary infection. 3. Stable pleural-parenchymal thickening in right upper lobe. 4. Stable small left lower lobe pulmonary nodules.   02/20/15  Hospitalized with GI Bleed  ASA stopped and coumadin changed to eliquis per Dr [name]  Acute GI bleed: Patient presented with hemoglobin of 6.2, INR above 10 at the time of admission. - Patient was admitted to stepdown unit, transfused 4 units packed RBCs and 5 units of FFP through hospitalization. - Patient was  on aspirin, Plavix, warfarin prior to admission. - Gastroenterology was consulted. Patient underwent endoscopy on 11/9 which showed normal esophagus, 5cm HH, gastritis w/ numerous clean based linear ulcers of stomach, and a few clean based ulcers in the duodenal bulb -Continue Protonix twice a day as recommended by gastroenterology, avoid NSAIDs. Meloxicam discontinued. -H pylori screen negative    ROS: Denies fever, malais, weight loss, blurry vision, decreased visual acuity, cough, sputum, SOB, hemoptysis, pleuritic pain, palpitaitons, heartburn, abdominal pain, melena, lower extremity edema, claudication, or rash.  All other systems reviewed and negative  General: BP 80/palp right 120/80 left  Affect appropriate Chronically ill female  HEENT: normal Neck supple with no adenopathy JVP normal bilateral subclavian bruits and carotid  bruits no thyromegaly Lungs rhonchi decreased BS right base no  wheezing and good diaphragmatic motion Heart:  S1/S2 no murmur, no rub, gallop or click PMI normal Abdomen: benighn, BS positve, no tenderness, no AAA no bruit.  No HSM or HJR Distal pulses intact with no bruits No edema Neuro non-focal Skin warm and dry bruising  No muscular weakness   Current Outpatient Prescriptions  Medication Sig Dispense Refill  . albuterol (PROVENTIL HFA;VENTOLIN HFA) 108 (90 BASE) MCG/ACT inhaler Inhale 1-2 puffs into the lungs every 6 (six) hours as needed for wheezing or shortness of breath.    Marland Kitchen amiodarone (PACERONE) 200 MG tablet Take 1 tablet by mouth  daily 90 tablet 3  . amLODipine (NORVASC) 10 MG tablet Take 10 mg by mouth every evening.     Marland Kitchen  apixaban (ELIQUIS) 5 MG TABS tablet Take 1 tablet (5 mg total) by mouth 2 (two) times daily. 180 tablet 3  . calcium-vitamin D (OSCAL) 250-125 MG-UNIT per tablet Take 1 tablet by mouth 2 (two) times daily.      . clopidogrel (PLAVIX) 75 MG tablet Take 1 tablet by mouth  every morning 90 tablet 1  . ferrous sulfate 325  (65 FE) MG tablet Take 325 mg by mouth daily with breakfast.    . HYDROcodone-acetaminophen (NORCO/VICODIN) 5-325 MG tablet Take 1 tablet by mouth every 6 (six) hours as needed for moderate pain. 20 tablet 0  . irbesartan (AVAPRO) 150 MG tablet Take 1 tablet (150 mg total) by mouth every evening. 90 tablet 1  . magnesium oxide (MAG-OX) 400 MG tablet Take 400 mg by mouth every morning.     . metoprolol tartrate (LOPRESSOR) 25 MG tablet Take 3 tablets by mouth two times daily 540 tablet 3  . Multiple Vitamin (MULTIVITAMIN WITH MINERALS) TABS tablet Take 1 tablet by mouth every morning.    . naloxegol oxalate (MOVANTIK) 25 MG TABS tablet Take 25 mg by mouth daily as needed (constipation).     Marland Kitchen oxyCODONE-acetaminophen (PERCOCET) 7.5-325 MG per tablet Take 1 tablet by mouth at bedtime as needed. For pain    . pantoprazole (PROTONIX) 40 MG tablet Take 40 mg by mouth daily.    . pravastatin (PRAVACHOL) 20 MG tablet Take 1 tablet (20 mg total) by mouth daily. 90 tablet 3  . Tiotropium Bromide Monohydrate (SPIRIVA RESPIMAT) 2.5 MCG/ACT AERS Inhale 2 puffs into the lungs daily. 3 Inhaler 3  . vitamin C (ASCORBIC ACID) 500 MG tablet Take 500 mg by mouth every morning.      No current facility-administered medications for this visit.     Allergies  Iodine; Iohexol; and Tape  Electrocardiogram:  05/21/14  SR PR 276  LVH    Assessment and Plan Vascular: right innominate occlusion with steal moderate Left ICA disease.  F/u [facility] PAF:  Continue eliquis lower to 2.5 bid if any further bleeding issues  Chol:  On statin.  Labs with primary  HTN:  On ARB well controlled  Pulmonary:  F/u CT in 3/17  Cavitary lung lesion not cancer Edema:  Reviewed ABI's and venous duplex9/16 no DVT and circulation fine mild lymphedema GI Bleed:  ASA stopped continue protonix  Lab Results  Component Value Date   HCT 38.0 11/01/2015    F/u with me in 6 months   [name]

## 2015-11-10 ENCOUNTER — Other Ambulatory Visit: Payer: Self-pay | Admitting: Cardiovascular Disease

## 2015-11-11 ENCOUNTER — Encounter: Payer: Self-pay | Admitting: Cardiovascular Disease

## 2015-11-11 ENCOUNTER — Ambulatory Visit (INDEPENDENT_AMBULATORY_CARE_PROVIDER_SITE_OTHER): Payer: Medicare Other | Admitting: Cardiovascular Disease

## 2015-11-11 ENCOUNTER — Encounter (INDEPENDENT_AMBULATORY_CARE_PROVIDER_SITE_OTHER): Payer: Self-pay

## 2015-11-11 VITALS — BP 138/61 | HR 60 | Ht 60.0 in | Wt 102.8 lb

## 2015-11-11 DIAGNOSIS — I6523 Occlusion and stenosis of bilateral carotid arteries: Secondary | ICD-10-CM | POA: Diagnosis not present

## 2015-11-11 NOTE — Patient Instructions (Signed)

## 2015-11-14 ENCOUNTER — Encounter (INDEPENDENT_AMBULATORY_CARE_PROVIDER_SITE_OTHER): Payer: Self-pay | Admitting: Internal Medicine

## 2015-11-14 ENCOUNTER — Other Ambulatory Visit (INDEPENDENT_AMBULATORY_CARE_PROVIDER_SITE_OTHER): Payer: Self-pay | Admitting: *Deleted

## 2015-11-14 DIAGNOSIS — K922 Gastrointestinal hemorrhage, unspecified: Secondary | ICD-10-CM

## 2015-11-14 DIAGNOSIS — K259 Gastric ulcer, unspecified as acute or chronic, without hemorrhage or perforation: Secondary | ICD-10-CM

## 2015-11-14 NOTE — Progress Notes (Signed)
Patient given an appointment for 01/24/16 at 9:45am.  Patient was mailed a letter.

## 2015-11-22 ENCOUNTER — Ambulatory Visit (INDEPENDENT_AMBULATORY_CARE_PROVIDER_SITE_OTHER): Payer: Medicare Other | Admitting: Internal Medicine

## 2015-11-22 ENCOUNTER — Other Ambulatory Visit: Payer: Medicare Other

## 2015-11-22 ENCOUNTER — Encounter: Payer: Self-pay | Admitting: Internal Medicine

## 2015-11-22 VITALS — BP 118/84 | HR 51 | Ht 64.0 in | Wt 100.8 lb

## 2015-11-22 DIAGNOSIS — J449 Chronic obstructive pulmonary disease, unspecified: Secondary | ICD-10-CM

## 2015-11-22 DIAGNOSIS — I6523 Occlusion and stenosis of bilateral carotid arteries: Secondary | ICD-10-CM | POA: Diagnosis not present

## 2015-11-22 NOTE — Progress Notes (Signed)
Subjective:     Patient ID: [name], female   DOB: 1940/04/25, 75 y.o.   MRN: [id number]  HPI   OV 11/22/2015  Chief Complaint  Patient presents with  . Follow-up    Pt states that she tries to take the [place] Resp daily - sometimes she forgets. Denies current breathing issues. Pt states that she does not have to use the Albuterol HFA.    75 year old female former smoker with significant neuromuscular disability due to prior stroke, spinal issues.    STUDIES:  CT chest (05/2014) Masslike area of consolidation RLL, r hilar and mediastinal LAN CT chest (06/2014) Significant improvement of RLL opacification, cavitary lesion persists suggestive of resolving PNA with necrosis.  CT chest (09/2014) Near complete collapse of the right lower lobe cavitary lesion with residual pleural-parenchymal thickening. New fine ground-glass nodules in the right middle lobe consistent with pulmonary infection. CT chest (02/15/2015) Recurrent to rounded masslike area of consolidation in the right lower lobe. Given that this area was present previously then resolved and has recurrent quickly, this is most compatible with rounded pneumonia. Small right pleural effusion with adjacent right lower lobe atelectasis or consolidation. Mildly enlarged precarinal lymph node, likely reactive. CT chest 04/13/15 >Interval improvement in masslike consolidation in the right lower lobe, without complete resolution, favoring resolving pneumonia. 2. Locule of air at the lateral base of the right hemi thorax may be within necrotic lung. Bronchopleural fistula cannot be definitively excluded. 3. Small, partially loculated right pleural effusion. 4. 4 mm left lower lobe nodule, stable.    PCP Delphina Cahill, MD REferrd by dr Johnsie Cancel HPI  IOV 07/09/2014  Chief Complaint  Patient presents with  . Pulmonary Consult    Pt referred by [name] fpr abnormal CT. Pt denies SOB, cough, and CP/tightness. Pt stated she went to hospital  for pna on 05/20/14.     75 year old female with significant neuromuscular disability due to prior stroke, spinal issues. Previous greater than 50 pack smoker. Was admitted around 05/20/2014 with significant right lower lobe pneumonia/huge masslike appearance on CT scan of the chest. Apparently she was extensively asymptomatic at this point in time. Treated with antibiotics. She had follow-up CT scan of the chest which shows significant improvement in the right lower lobe opacity but with a residual 5.2 cm thick-walled cavitary lesion. There for she's here for follow-up. At baseline she reports no dyspnea but then she is extremely disabled and is only able to move a little bit with pushing a wheelchair and this is because of a spinal issues. She only rarely feels short of breath and manages with pro-air when necessary. She's never had lung function test in the past. She denies any cough. Currently she feels better     OV 09/29/2014  Chief Complaint  Patient presents with  . Follow-up    Pt here after CT scan. Pt states her breathing is unchanged since last OV. Pt has left ankle edema dt recent sprain, pt is seeing PCP for the issue.     Follow-up   - Right lower lobe lung cavity following pneumonia February 2016: She had CT scan of the chest June 2016. This showed near resolution of the cavity and it is down to a scar tissue. I personally visualized image dated 09/08/2014  - New issue: CT scan of the chest 09/08/2014 compared to CT scans of the chest March 2016 show some new right middle lobe groundglass nodules that are extremely small. She denies she was sick  at that time  - Other issue: History of smoking with shortness of breath: She denies any shortness of breath or cough but on deeper questioning she does admit to occasional shortness of breath that is very rare. She uses pro-air may be once every 2 weeks. She feels good. She's never had pulmonary function testing. She has a history of 56  pack smoking history but is quit. She does not want to pulmonary function test now but will do it in the future   06/03/2015 Acute OV : Cavitary PNA  Pt presents for work in visit. Was seen 1 month ago . She is recovering from recurrent cavitary PNA on the right.  Previous episode in 05/2014 with right sided mass like consolidation. She was treated with abx. Serial CT showed near complete resolution on CT chest 09/2014.  Admitted in Nov 2016 with acute GI bleed and cavitary lung lesion. EGD showed a normal esophagus and gastritis with numerous clean, linear ulcers of the stomach and a few ulcers of the duodenal bulb. She did stabilize and was transitioned back on Eliquis '5mg'$  Twice daily   Prior to discharge CT chest showed a recurrent rounded masslike area of consolidation on the right lower lobe measuring 9.5 x 6.7 cm.  She was treated with aggressive ABX . Follow up CT chest in Jan showed interval improvement in RLL consolidation. Clinically she has been slowly improving.  She is very frail and has previous stroke years ago. She is on chronic pain meds as well for back issues.  She denies any swallow issues but does admit she eats in bed often. Says this week has not felt as good  Says that her right side was hurting when she lied down in bed and not as much energy. Feels much better today. CXR today shows further improvement in RLL opacity . No new areas noted.  She denies any bleeding , n/v/d , fever or chest pain. , orthopnea, hemoptysis , or edema.  Has ov with PCP next week for labs.  Last labs last month w/ improved H/H .    OV 07/12/2015  Chief Complaint  Patient presents with  . Follow-up    Pt last seen on 06/03/15 for pna. Pt states she is feeling well now. Pt deines SOB, cough, wheezing, CP/tightness, f/c/s.     Follow-up recurrent pneumonia. Most recently 6 weeks ago she saw nurse practitioner. She says she is now recovered from respiratory infection. She and her husband state that  she gets recurrent respiratory infection and is looking at ways to prevent it. Pulmonary function test today shows gold stage II COPD. CT scan chest as a follow-up from pneumonia 2 years ago shows that things have resolved to scar tissue. There is no lung mass. Overall she's asymptomatic from a respiratory standpoint at baseline but then she is largely disabled because of spinal issues and does not exert much.  CT and PFT were personally visualized  Pulmonary function test 07/08/2015 FEV1 1.44 L/70%. No post bronchodilator response. FVC 2.1 L/76% and a ratio 69 consistent with Gold stage II COPD. Total lung capacity is 98%. DLCO is reduced at 11.33/46%  Ct Chest Wo Contrast  07/11/2015  CLINICAL DATA:  Lung nodules. EXAM: CT CHEST WITHOUT CONTRAST TECHNIQUE: Multidetector CT imaging of the chest was performed following the standard protocol without IV contrast. COMPARISON:  CT chest exams dating back to 01/15/2008. FINDINGS: Mediastinum/Nodes: Sub cm low-attenuation lesion in the right lobe of the thyroid is incidentally noted. Mediastinal  lymph nodes are not enlarged by CT size criteria. Hilar regions are difficult to definitively evaluate without IV contrast. No axillary adenopathy. Surgical clips in the left axilla. Atherosclerotic calcification of the arterial vasculature. Dense calcification and narrowing of the right brachiocephalic artery. Three-vessel coronary artery calcification. Heart size normal. No pericardial effusion. Small hiatal hernia. Calcified subcarinal lymph node. Lungs/Pleura: Right apical pleural parenchymal scarring. Mild centrilobular emphysema. Post infectious scarring in the right middle and right lower lobes. A few scattered pulmonary nodules measure 4 mm or less in size, unchanged. Some are calcified. No pleural fluid. Airway is unremarkable. Upper abdomen: Visualized portions of the liver, adrenal glands unremarkable. Probable renal vascular calcifications bilaterally. Difficult  to exclude renal stones as well. Visualized portions of the spleen, pancreas and stomach are grossly unremarkable. Musculoskeletal: Degenerative changes are seen in the spine. No worrisome lytic or sclerotic lesions. Lower thoracic compression fractures, as before. IMPRESSION: 1. Post infectious scarring in the right middle and right lower lobes. 2. Three-vessel coronary artery calcification. 3. Advanced atherosclerotic calcification and narrowing of the proximal right brachiocephalic artery. Electronically Signed   By: Lorin Picket M.D.   On: 07/11/2015 11:25    OV 11/22/2015  Chief Complaint  Patient presents with  . Follow-up    Pt states that she tries to take the [place] Resp daily - sometimes she forgets. Denies current breathing issues. Pt states that she does not have to use the Albuterol HFA.     Follow-up Gold stage II COPD in the setting of neuromuscular issues due to spine and stroke. Currently on Spiriva.she is here with her husband. Overall she's doing well. She never uses albuterol for rescue since her last visit. No interim exacerbations or urgent care visits. Sleeps well. She's not interested in pulmonary rehabilitation. She continues to Spiriva. She takes flu shot in the fall and is willing to do so again.    has a past medical history of Anemia (05/2014.); Atrial fibrillation ([place]); Breast cancer ([place]); Carotid artery occlusion; Carotid bruit; Degenerative arthritis of spine (2015); Hyperlipidemia; Hypertension; Meningioma ([place]); Osteoporosis; Pneumonia (May 19, 2014); Stroke [facility]); Subclavian steal syndrome; and Ulcers of both lower extremities ([place]) (2015).   reports that she quit smoking about 9 years ago. Her smoking use included Cigarettes. She has a 56.00 pack-year smoking history. She has never used smokeless tobacco.  Past Surgical History:  Procedure Laterality Date  . CARDIAC CATHETERIZATION    . CATARACT EXTRACTION Bilateral 2013  . COLONOSCOPY  11/17/2010    Procedure: COLONOSCOPY;  Surgeon: [name], MD;  [facility];  Service: Endoscopy;  Laterality: N/A;  10:45 am  . ESOPHAGOGASTRODUODENOSCOPY  11/17/2010   Procedure: ESOPHAGOGASTRODUODENOSCOPY (EGD);  Surgeon: [name], MD;  [facility];  Service: Endoscopy;  Laterality: N/A;  . ESOPHAGOGASTRODUODENOSCOPY N/A 02/16/2015   Procedure: ESOPHAGOGASTRODUODENOSCOPY (EGD);  Surgeon: [name], MD;  Location: [facility];  Service: Gastroenterology;  Laterality: N/A;  . EXPLORATORY LAPAROTOMY  1960s   For peritonitis of undetermined cause  . EYE SURGERY    . LUMBAR EPIDURAL INJECTION  06-2012--06-2013   pt. states she has had 5 epidurals in 06-2012----06-2013  . MASTECTOMY Left 1990  . YAG LASER APPLICATION Right 06/20/9700   Procedure: YAG LASER APPLICATION;  Surgeon: [name], MD;  [facility];  Service: Ophthalmology;  Laterality: Right;  right  . YAG LASER APPLICATION Left 6/37/8588   Procedure: YAG LASER APPLICATION;  Surgeon: [name], MD;  [facility];  Service: Ophthalmology;  Laterality: Left;    Allergies  Allergen Reactions  . Iodine Rash and Other (See Comments)    BETADINE Rash/burning, blisters on skin.  . Iohexol Rash and Other (See Comments)    Blisters; PT NEEDS 13-HOUR PREP   . Tape Itching and Rash    Prefers PAPER TAPE    Immunization History  Administered Date(s) Administered  . Influenza Split 01/07/2014  . Influenza,inj,Quad PF,36+ Mos 01/12/2015  . Influenza-Unspecified 01/07/2013  . Pneumococcal Conjugate-13 01/07/2014  . Tdap 08/01/2011    Family History  Problem Relation Age of Onset  . Alzheimer's disease Mother   . Dementia Mother   . Hypertension Mother   . Hyperlipidemia Mother   . Parkinsonism Father      Current Outpatient Prescriptions:  .  amiodarone (PACERONE) 200 MG tablet, Take 1 tablet by mouth  daily, Disp: 90 tablet, Rfl: 3 .  amLODipine (NORVASC) 10 MG tablet, Take 10  mg by mouth every evening. , Disp: , Rfl:  .  apixaban (ELIQUIS) 5 MG TABS tablet, Take 1 tablet (5 mg total) by mouth 2 (two) times daily., Disp: 180 tablet, Rfl: 3 .  calcium-vitamin D (OSCAL) 250-125 MG-UNIT per tablet, Take 1 tablet by mouth 2 (two) times daily.  , Disp: , Rfl:  .  clopidogrel (PLAVIX) 75 MG tablet, Take 1 tablet by mouth  every morning, Disp: 90 tablet, Rfl: 1 .  ferrous sulfate 325 (65 FE) MG tablet, Take 325 mg by mouth daily with breakfast., Disp: , Rfl:  .  HYDROcodone-acetaminophen (NORCO/VICODIN) 5-325 MG tablet, Take 1 tablet by mouth every 6 (six) hours as needed for moderate pain., Disp: 20 tablet, Rfl: 0 .  irbesartan (AVAPRO) 150 MG tablet, Take 1 tablet (150 mg total) by mouth every evening., Disp: 90 tablet, Rfl: 1 .  magnesium oxide (MAG-OX) 400 MG tablet, Take 400 mg by mouth every morning. , Disp: , Rfl:  .  metoprolol tartrate (LOPRESSOR) 25 MG tablet, Take 3 tablets by mouth two times daily, Disp: 540 tablet, Rfl: 3 .  Multiple Vitamin (MULTIVITAMIN WITH MINERALS) TABS tablet, Take 1 tablet by mouth every morning., Disp: , Rfl:  .  naloxegol oxalate (MOVANTIK) 25 MG TABS tablet, Take 25 mg by mouth daily as needed (constipation). , Disp: , Rfl:  .  oxyCODONE-acetaminophen (PERCOCET) 7.5-325 MG per tablet, Take 1 tablet by mouth at bedtime as needed. For pain, Disp: , Rfl:  .  pantoprazole (PROTONIX) 40 MG tablet, Take 40 mg by mouth daily., Disp: , Rfl:  .  pravastatin (PRAVACHOL) 20 MG tablet, Take 1 tablet (20 mg total) by mouth daily., Disp: 90 tablet, Rfl: 3 .  Tiotropium Bromide Monohydrate (SPIRIVA RESPIMAT) 2.5 MCG/ACT AERS, Inhale 2 puffs into the lungs daily., Disp: 3 Inhaler, Rfl: 3 .  vitamin C (ASCORBIC ACID) 500 MG tablet, Take 500 mg by mouth every morning. , Disp: , Rfl:  .  albuterol (PROVENTIL HFA;VENTOLIN HFA) 108 (90 BASE) MCG/ACT inhaler, Inhale 1-2 puffs into the lungs every 6 (six) hours as needed for wheezing or shortness of breath.,  Disp: , Rfl:    Review of Systems     Objective:   Physical Exam  Constitutional: She is oriented to person, place, and time. She appears well-developed and well-nourished. No distress.  HENT:  Head: Normocephalic and atraumatic.  Right Ear: External ear normal.  Left Ear: External ear normal.  Mouth/Throat: Oropharynx is clear and moist. No oropharyngeal exudate.  Eyes: Conjunctivae and EOM are  normal. Pupils are equal, round, and reactive to light. Right eye exhibits no discharge. Left eye exhibits no discharge. No scleral icterus.  Neck: Normal range of motion. Neck supple. No JVD present. No tracheal deviation present. No thyromegaly present.  Cardiovascular: Normal rate, regular rhythm, normal heart sounds and intact distal pulses.  Exam reveals no gallop and no friction rub.   No murmur heard. Pulmonary/Chest: Effort normal and breath sounds normal. No respiratory distress. She has no wheezes. She has no rales. She exhibits no tenderness.  Abdominal: Soft. Bowel sounds are normal. She exhibits no distension and no mass. There is no tenderness. There is no rebound and no guarding.  Musculoskeletal: Normal range of motion. She exhibits no edema or tenderness.  Frail female Uses cane  Slow gait  Lymphadenopathy:    She has no cervical adenopathy.  Neurological: She is alert and oriented to person, place, and time. She has normal reflexes. No cranial nerve deficit. She exhibits normal muscle tone. Coordination normal.  Skin: Skin is warm and dry. No rash noted. She is not diaphoretic. No erythema. No pallor.  Psychiatric: She has a normal mood and affect. Her behavior is normal. Judgment and thought content normal.  Vitals reviewed.    Vitals:   11/22/15 1015  BP: 118/84  Pulse: (!) 51  SpO2: 93%  Weight: 100 lb 12.8 oz (45.7 kg)  Height: '5\' 4"'$  (1.626 m)       Assessment:       ICD-9-CM ICD-10-CM   1. COPD, moderate ([place]) 496 J44.9 Alpha-1 antitrypsin phenotype        Plan:     Stable disease  Plan Do alpha 1 blood test today Continue spiriva daily as before Use albuterol as needed Flu shot in fall  followup 6 months or sooner if needed; call us anytime or return sooner if any concern or problem   Dr. Brand Males, M.D., [facility].C.P Pulmonary and Critical Care Medicine Staff Physician [place] [facility] Pager: [phone], If no answer or between  15:00h - 7:00h: call [phone]  11/22/2015 10:35 AM

## 2015-11-22 NOTE — Patient Instructions (Signed)
ICD-9-CM ICD-10-CM   1. COPD, moderate ([place]) 496 J44.9 Alpha-1 antitrypsin phenotype   Stable disease  Plan Do alpha 1 blood test today Continue spiriva daily as before Use albuterol as needed Flu shot in fall  followup 6 months or sooner if needed; call us anytime or return sooner if any concern or problem

## 2015-11-23 ENCOUNTER — Other Ambulatory Visit: Payer: Self-pay | Admitting: *Deleted

## 2015-11-23 MED ORDER — APIXABAN 5 MG PO TABS: 5.0000 mg | ORAL_TABLET | Freq: Two times a day (BID) | ORAL | 3 refills | Status: DC

## 2015-11-26 LAB — ALPHA-1 ANTITRYPSIN PHENOTYPE: A1 ANTITRYPSIN: 140 mg/dL (ref 83–199)

## 2015-12-05 ENCOUNTER — Other Ambulatory Visit: Payer: Self-pay

## 2015-12-09 ENCOUNTER — Ambulatory Visit (INDEPENDENT_AMBULATORY_CARE_PROVIDER_SITE_OTHER): Payer: Medicare Other | Admitting: Internal Medicine

## 2015-12-09 ENCOUNTER — Encounter (INDEPENDENT_AMBULATORY_CARE_PROVIDER_SITE_OTHER): Payer: Self-pay | Admitting: Internal Medicine

## 2015-12-09 VITALS — BP 112/78 | HR 64 | Temp 97.6°F | Ht 64.0 in | Wt 99.3 lb

## 2015-12-09 DIAGNOSIS — I6523 Occlusion and stenosis of bilateral carotid arteries: Secondary | ICD-10-CM | POA: Diagnosis not present

## 2015-12-09 DIAGNOSIS — K922 Gastrointestinal hemorrhage, unspecified: Secondary | ICD-10-CM | POA: Diagnosis not present

## 2015-12-09 NOTE — Patient Instructions (Signed)
OV in 6 months. CBC in 3 months.

## 2015-12-09 NOTE — Progress Notes (Addendum)
Subjective:    Patient ID: [name], female    DOB: 1940/11/27, 75 y.o.   MRN: [id number]  HPI Here today for f/u. She was last seen in September of 2017.  Hx of recent UGI bleed in November of 2016. She was having black stool and rectal bleeding. Admitted to [facility]. Hemoglobin 6.2 on admission.  She received PRBCs and FFP.  She underwent an EGD  02/16/2015 EGD: GI Bleed: Dr. Remo Lipps Armbruster Normal esophagus. 5cm hiatal hernia. Gastritis with numerous clean based ulcers of the stomach with a few cleaned based ulcers in the duodenal bulb.  She tells me she is doing good. She has a fan when she becomes hot. Appetite is good. No weight loss. BMs are normal. No melena.  Eliquis and Plavix for atrial fib and CAD.  CBC in July was normal      CBC    Component Value Date/Time   WBC 6.7 07/15/2015 0835   RBC 4.17 07/15/2015 0835   HGB 12.5 11/01/2015 0752   HCT 38.0 11/01/2015 0752   PLT 293 07/15/2015 0835   MCV 92.8 07/15/2015 0835   MCH 30.0 07/15/2015 0835   MCHC 32.3 07/15/2015 0835   RDW 16.6 (H) 07/15/2015 0835   LYMPHSABS 1.8 06/08/2015 0900   MONOABS 2.0 (H) 06/08/2015 0900   EOSABS 0.0 06/08/2015 0900   BASOSABS 0.0 06/08/2015 0900     Review of Systems Past Medical History:  Diagnosis Date  . Anemia 05/2014.  Marland Kitchen Atrial fibrillation ([place])    x 3 yrs  . Breast cancer ([place])    S/P left mastectomy and chemotherapy 1989 remained in remission  . Carotid artery occlusion   . Carotid bruit    LICA 02-58% (duplex 5/27)  . Degenerative arthritis of spine 2015   Chronic back pain  . Hyperlipidemia   . Hypertension   . Meningioma ([place])   . Osteoporosis   . Pneumonia May 19, 2014  . Stroke [facility])    CVA 2008  . Subclavian steal syndrome   . Ulcers of both lower extremities ([place]) 2015    Past Surgical History:  Procedure Laterality Date  . CARDIAC CATHETERIZATION    . CATARACT EXTRACTION Bilateral 2013  . COLONOSCOPY  11/17/2010   Procedure: COLONOSCOPY;   Surgeon: [name], MD;  [facility];  Service: Endoscopy;  Laterality: N/A;  10:45 am  . ESOPHAGOGASTRODUODENOSCOPY  11/17/2010   Procedure: ESOPHAGOGASTRODUODENOSCOPY (EGD);  Surgeon: [name], MD;  [facility];  Service: Endoscopy;  Laterality: N/A;  . ESOPHAGOGASTRODUODENOSCOPY N/A 02/16/2015   Procedure: ESOPHAGOGASTRODUODENOSCOPY (EGD);  Surgeon: [name], MD;  Location: [facility];  Service: Gastroenterology;  Laterality: N/A;  . EXPLORATORY LAPAROTOMY  1960s   For peritonitis of undetermined cause  . EYE SURGERY    . LUMBAR EPIDURAL INJECTION  06-2012--06-2013   pt. states she has had 5 epidurals in 06-2012----06-2013  . MASTECTOMY Left 1990  . YAG LASER APPLICATION Right 7/82/4235   Procedure: YAG LASER APPLICATION;  Surgeon: [name], MD;  [facility];  Service: Ophthalmology;  Laterality: Right;  right  . YAG LASER APPLICATION Left 3/61/4431   Procedure: YAG LASER APPLICATION;  Surgeon: [name], MD;  [facility];  Service: Ophthalmology;  Laterality: Left;    Allergies  Allergen Reactions  . Iodine Rash and Other (See Comments)    BETADINE Rash/burning, blisters on skin.  . Iohexol Rash and Other (See Comments)    Blisters; PT  NEEDS 13-HOUR PREP   . Tape Itching and Rash    Prefers PAPER TAPE    Current Outpatient Prescriptions on File Prior to Visit  Medication Sig Dispense Refill  . albuterol (PROVENTIL HFA;VENTOLIN HFA) 108 (90 BASE) MCG/ACT inhaler Inhale 1-2 puffs into the lungs every 6 (six) hours as needed for wheezing or shortness of breath.    Marland Kitchen amiodarone (PACERONE) 200 MG tablet Take 1 tablet by mouth  daily 90 tablet 3  . amLODipine (NORVASC) 10 MG tablet Take 10 mg by mouth every evening.     Marland Kitchen apixaban (ELIQUIS) 5 MG TABS tablet Take 1 tablet (5 mg total) by mouth 2 (two) times daily. 180 tablet 3  . calcium-vitamin D (OSCAL) 250-125 MG-UNIT per tablet Take 1 tablet by mouth 2 (two) times  daily.      . clopidogrel (PLAVIX) 75 MG tablet Take 1 tablet by mouth  every morning 90 tablet 1  . ferrous sulfate 325 (65 FE) MG tablet Take 325 mg by mouth daily with breakfast.    . HYDROcodone-acetaminophen (NORCO/VICODIN) 5-325 MG tablet Take 1 tablet by mouth every 6 (six) hours as needed for moderate pain. 20 tablet 0  . irbesartan (AVAPRO) 150 MG tablet Take 1 tablet (150 mg total) by mouth every evening. 90 tablet 1  . magnesium oxide (MAG-OX) 400 MG tablet Take 400 mg by mouth every morning.     . metoprolol tartrate (LOPRESSOR) 25 MG tablet Take 3 tablets by mouth two times daily 540 tablet 3  . Multiple Vitamin (MULTIVITAMIN WITH MINERALS) TABS tablet Take 1 tablet by mouth every morning.    . naloxegol oxalate (MOVANTIK) 25 MG TABS tablet Take 25 mg by mouth daily as needed (constipation).     Marland Kitchen oxyCODONE-acetaminophen (PERCOCET) 7.5-325 MG per tablet Take 1 tablet by mouth at bedtime as needed. For pain    . pantoprazole (PROTONIX) 40 MG tablet Take 40 mg by mouth daily.    . pravastatin (PRAVACHOL) 20 MG tablet Take 1 tablet (20 mg total) by mouth daily. 90 tablet 3  . Tiotropium Bromide Monohydrate (SPIRIVA RESPIMAT) 2.5 MCG/ACT AERS Inhale 2 puffs into the lungs daily. 3 Inhaler 3  . vitamin C (ASCORBIC ACID) 500 MG tablet Take 500 mg by mouth every morning.      No current facility-administered medications on file prior to visit.        Objective:   Physical Exam Blood pressure 112/78, pulse 64, temperature 97.6 F (36.4 C), height '5\' 4"'$  (1.626 m), weight 99 lb 4.8 oz (45 kg).  Alert and oriented. Skin warm and dry. Oral mucosa is moist.   . Sclera anicteric, conjunctivae is pink. Thyroid not enlarged. No cervical lymphadenopathy. Lungs clear. Heart regular rate and rhythm.  Abdomen is soft. Bowel sounds are positive. No hepatomegaly. No abdominal masses felt. No tenderness.  No edema to lower extremities.         Assessment & Plan:  UGIB: she is doing well. No  melena. Will see back in 6 months. CBC in 3 months.

## 2015-12-21 ENCOUNTER — Telehealth: Payer: Self-pay | Admitting: Cardiovascular Disease

## 2015-12-21 NOTE — Telephone Encounter (Signed)
Left message for patient to call back. Patient's husband stated that patient is suppose to have a tooth pulled and wanted to know if she needs to come off her elquis or plavix. Informed patient's husband that it would be best for the dentist to send in a request for holding her medications. Patient's husband verbalized understanding and will call back with any other questions.

## 2015-12-21 NOTE — Telephone Encounter (Signed)
Request for surgical clearance:  1. What type of surgery is being performed? Tooth Extraction  2. When is this surgery scheduled? 12-27-15   3. Are there any medications that need to be held prior to surgery and how long?Can pt stop her Plavix? If so how long?   4. Name of physician performing surgery? Dr Retta Mac   5. What is your office phone and fax number? [phone] and fax number is [phone]

## 2015-12-21 NOTE — Telephone Encounter (Signed)
New message   Pt calling about a procedure she is having on 9/19 and she would like to know if she needs to be off the Plavix and any other of meds prior to the procedure. Please call.

## 2015-12-22 NOTE — Telephone Encounter (Signed)
Follow up      Calling to get update on stopping plavix for tooth extraction.

## 2015-12-22 NOTE — Telephone Encounter (Signed)
Spoke with patient and advised her that [name] is out of the office and per Dr. Burt Knack, DOD, she may hold Plavix 5 days prior to tooth extraction. She asked about stopping eliquis.  I reviewed with Fuller Canada, PharmD who advised that with patient's history of CVA, that she continue eliquis for 1 tooth extraction.  Patient verbalized understanding and agreement and thanked me for the call.  Note faxed to Dr. Reather Laurence office.

## 2016-01-02 ENCOUNTER — Other Ambulatory Visit (INDEPENDENT_AMBULATORY_CARE_PROVIDER_SITE_OTHER): Payer: Self-pay | Admitting: *Deleted

## 2016-01-02 ENCOUNTER — Encounter (INDEPENDENT_AMBULATORY_CARE_PROVIDER_SITE_OTHER): Payer: Self-pay | Admitting: *Deleted

## 2016-01-02 DIAGNOSIS — K259 Gastric ulcer, unspecified as acute or chronic, without hemorrhage or perforation: Secondary | ICD-10-CM

## 2016-01-02 DIAGNOSIS — K922 Gastrointestinal hemorrhage, unspecified: Secondary | ICD-10-CM

## 2016-01-19 DIAGNOSIS — Z23 Encounter for immunization: Secondary | ICD-10-CM | POA: Diagnosis not present

## 2016-01-19 DIAGNOSIS — K922 Gastrointestinal hemorrhage, unspecified: Secondary | ICD-10-CM | POA: Diagnosis not present

## 2016-01-19 DIAGNOSIS — K259 Gastric ulcer, unspecified as acute or chronic, without hemorrhage or perforation: Secondary | ICD-10-CM | POA: Diagnosis not present

## 2016-01-19 LAB — CBC
HCT: 40.5 % (ref 35.0–45.0)
Hemoglobin: 13.2 g/dL (ref 11.7–15.5)
MCH: 31.9 pg (ref 27.0–33.0)
MCHC: 32.6 g/dL (ref 32.0–36.0)
MCV: 97.8 fL (ref 80.0–100.0)
MPV: 10.3 fL (ref 7.5–12.5)
PLATELETS: 362 10*3/uL (ref 140–400)
RBC: 4.14 MIL/uL (ref 3.80–5.10)
RDW: 14.1 % (ref 11.0–15.0)
WBC: 11.2 10*3/uL — AB (ref 3.8–10.8)

## 2016-01-26 ENCOUNTER — Other Ambulatory Visit (INDEPENDENT_AMBULATORY_CARE_PROVIDER_SITE_OTHER): Payer: Self-pay | Admitting: *Deleted

## 2016-01-26 DIAGNOSIS — K259 Gastric ulcer, unspecified as acute or chronic, without hemorrhage or perforation: Secondary | ICD-10-CM

## 2016-01-26 DIAGNOSIS — K922 Gastrointestinal hemorrhage, unspecified: Secondary | ICD-10-CM

## 2016-02-16 IMAGING — CT CT L SPINE W/O CM
3 of 9 series · 13 of 33 positions shown, 16 images · non-contrast
Comparison: Plain films lumbar spine 05/05/2012. CT chest
05/05/2012.

CLINICAL DATA: Low back pain for 3 months which has markedly
worsened over the past 2 weeks. No known injury. Initial encounter.

EXAM:
CT LUMBAR SPINE WITHOUT CONTRAST
TECHNIQUE: Multidetector CT imaging of the lumbar spine was performed without
intravenous contrast administration. Multiplanar CT image
reconstructions were also generated.

[Series 3: lumbar spine 2.0 b30s · axial · 0.36mm/px · z∈[-398,-268]mm · 5 of 91 slices shown, 7 images]
[im 13/91  soft-tissue]
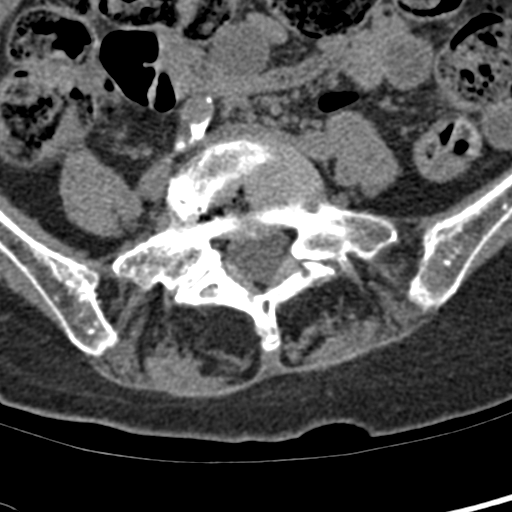
[im 13/91  bone]
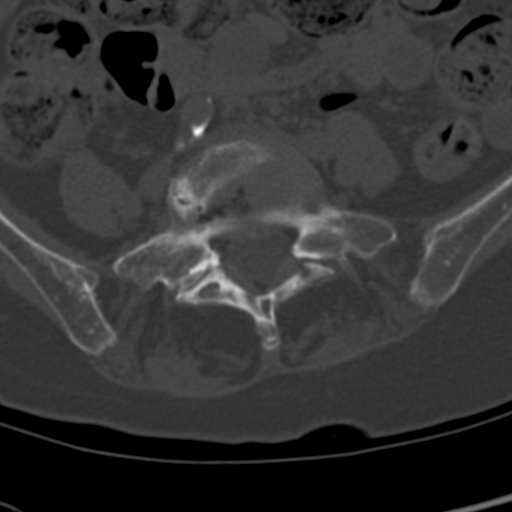
[im 26/91  bone]
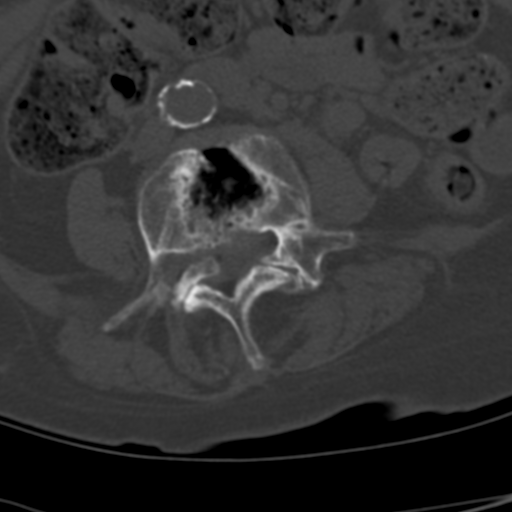
[im 52/91  bone]
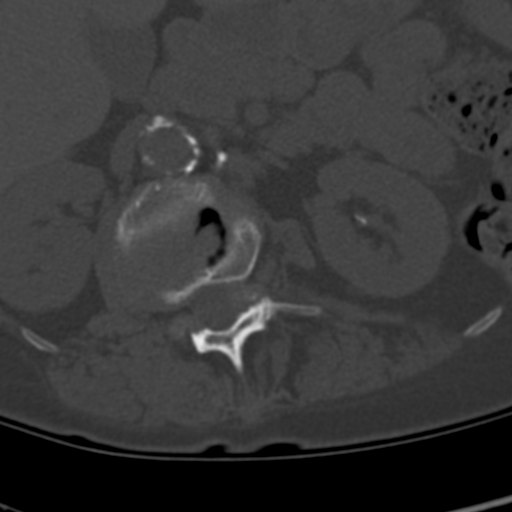
[im 65/91  bone]
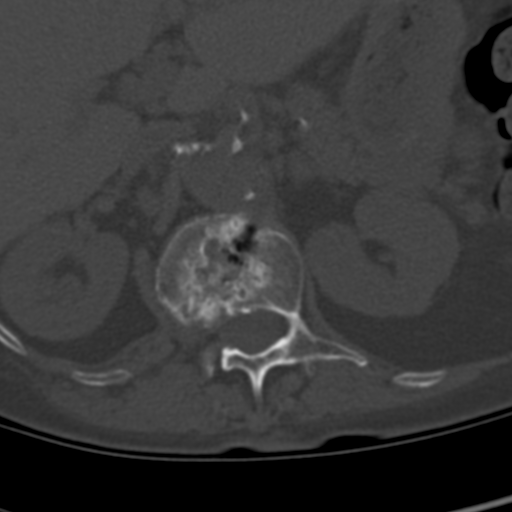
[im 78/91  soft-tissue]
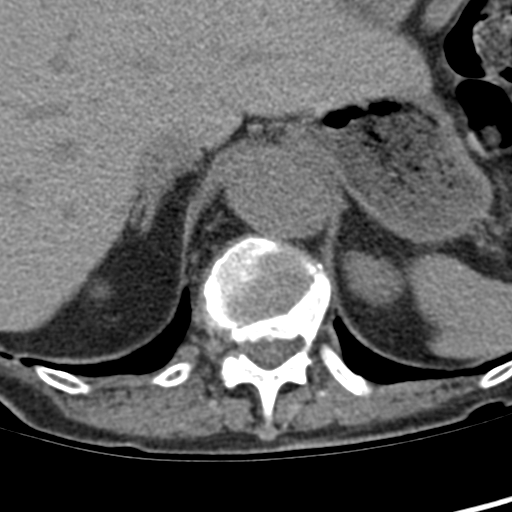
[im 78/91  bone]
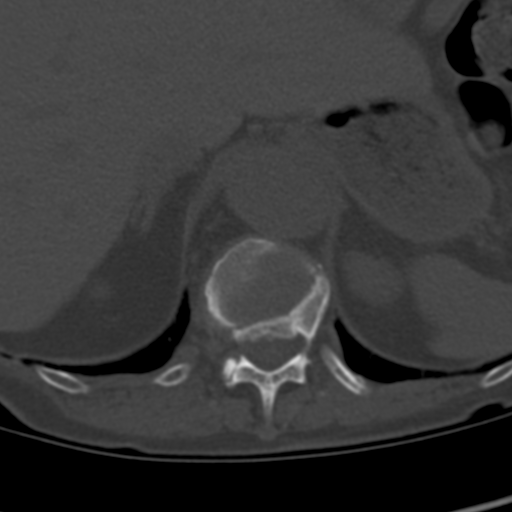

[Series 4: lumbar spine 2.0 spo cor · coronal · 0.30mm/px · 3 of 73 slices shown]
[im 15/73  bone]
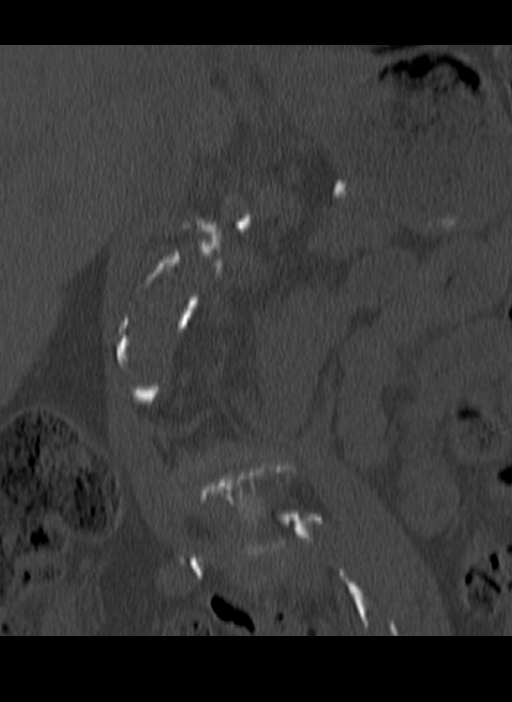
[im 29/73  bone]
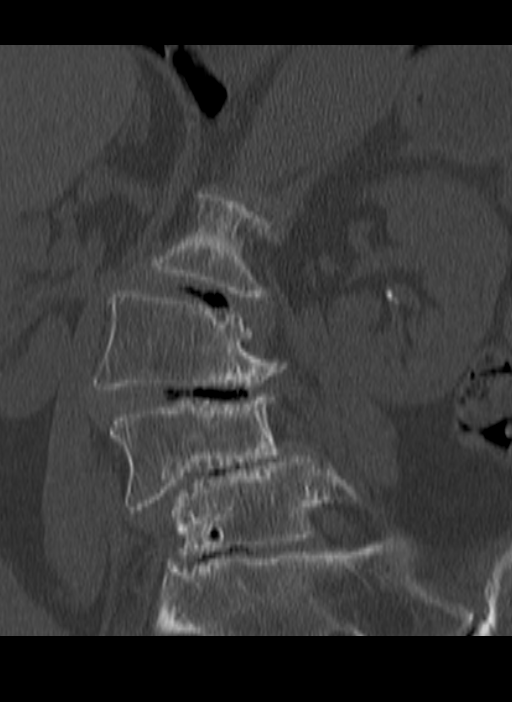
[im 44/73  bone]
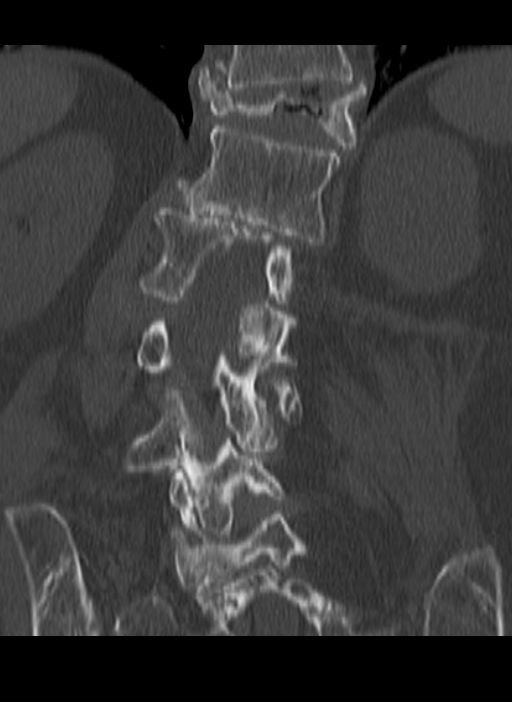

[Series 5: lumbar spine 2.0 spo sag · sagittal · 0.33mm/px · 5 of 81 slices shown, 6 images]
[im 27/81  bone]
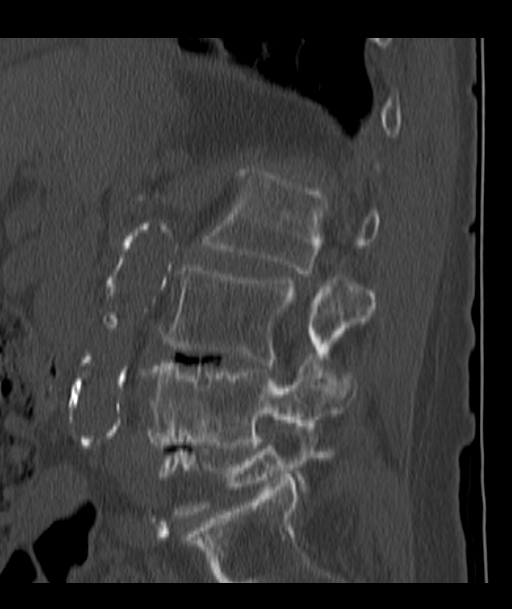
[im 34/81  bone]
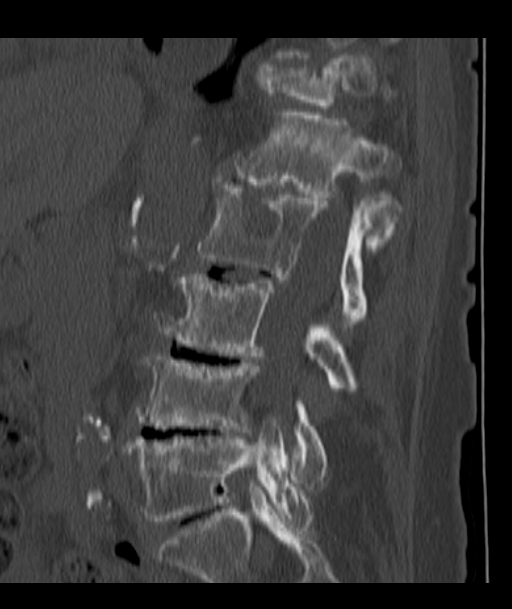
[im 41/81  soft-tissue]
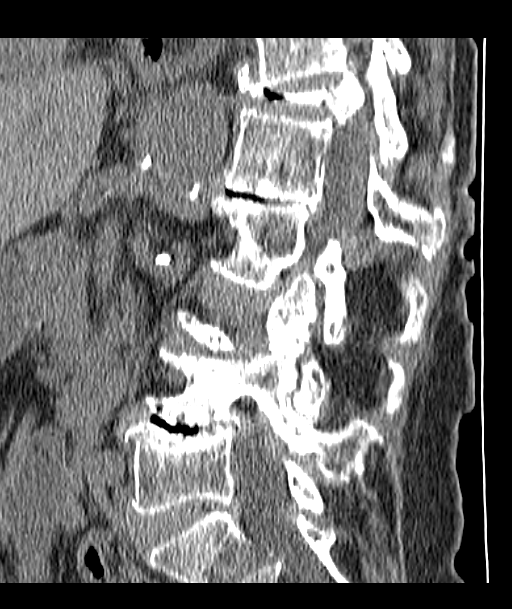
[im 41/81  bone]
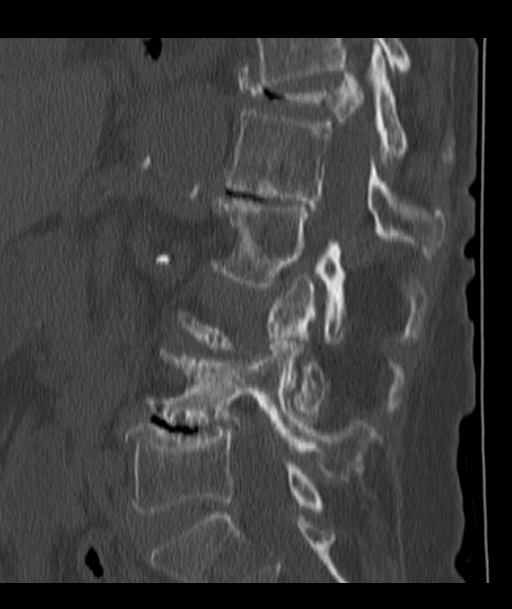
[im 47/81  bone]
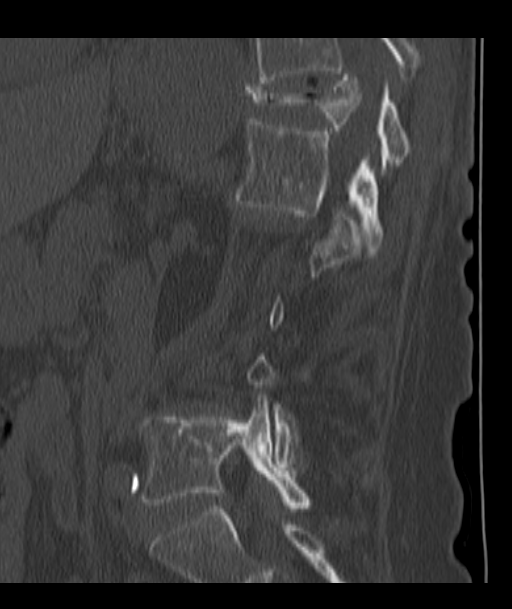
[im 54/81  bone]
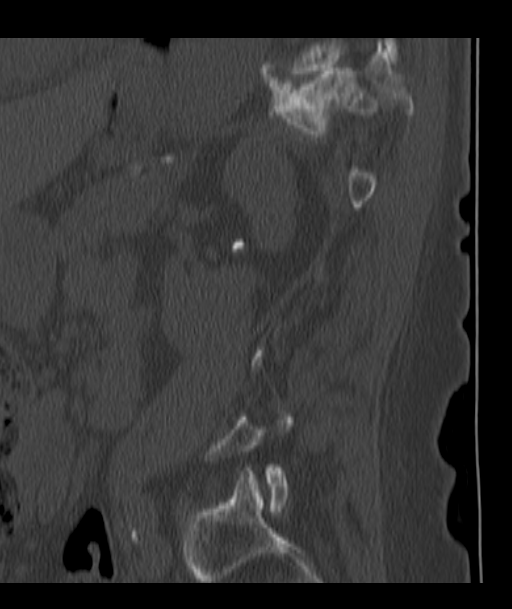

[13 of 33 positions shown; findings below may reference images not displayed]

FINDINGS: Marked convex right scoliosis is identified. The patient has a
severe compression fracture of T12 were vertebral plana deformity is
identified and there is bony retropulsion into the central spinal
canal. The appearance is unchanged compared to the prior chest CT.
No other vertebral fracture is identified. Remote healed fracture of
the left twelfth rib is noted. Paraspinous structures demonstrate a
small hiatal hernia. Aortoiliac atherosclerosis is identified. No
aneurysm.

T11-12: Bone is retropulsed into the central spinal canal.
Moderately severe central canal narrowing is present. The foramina
are open. The appearance is unchanged.

T12-L1: Mild bony retropulsion off the inferior endplate causes mild
central canal stenosis. The foramina are open.

L1-2: Shallow disc bulge and endplate spur without central canal or
foraminal stenosis.

L2-3: There is some facet degenerative disease. Mild retrolisthesis
is noted. The central canal and foramina appear open.

L3-4: Facet arthropathy is identified and there is a shallow disc
bulge with endplate spur. Moderate central canal narrowing is seen.
Moderate left foraminal narrowing also identified. The right foramen
is open.

L4-5: Disc, endplate spur and facet arthropathy cause moderately
severe central canal stenosis. Left worse than right foraminal
narrowing is identified.

L5-S1: There is facet degenerative change and a shallow disc bulge
but the central spinal canal and left foramen appear open. Moderate
to moderate severe right foraminal narrowing is seen.
IMPRESSION: No acute abnormality.

Remote T12 compression fracture with bony retropulsion resulting in
moderately severe central canal stenosis at and just below the
T11-12 level.

Multilevel spondylosis appearing worst at L4-5 and L3-4 as above.

Severe convex right scoliosis.

## 2016-02-20 ENCOUNTER — Other Ambulatory Visit: Payer: Self-pay

## 2016-02-20 MED ORDER — IRBESARTAN 150 MG PO TABS: 150.0000 mg | ORAL_TABLET | Freq: Every evening | ORAL | 2 refills | Status: DC

## 2016-02-20 MED ORDER — AMLODIPINE BESYLATE 10 MG PO TABS: 10.0000 mg | ORAL_TABLET | Freq: Every evening | ORAL | 2 refills | Status: DC

## 2016-03-07 DIAGNOSIS — Z681 Body mass index (BMI) 19 or less, adult: Secondary | ICD-10-CM | POA: Diagnosis not present

## 2016-03-07 DIAGNOSIS — L89512 Pressure ulcer of right ankle, stage 2: Secondary | ICD-10-CM | POA: Diagnosis not present

## 2016-04-12 DIAGNOSIS — I4891 Unspecified atrial fibrillation: Secondary | ICD-10-CM | POA: Diagnosis not present

## 2016-04-12 DIAGNOSIS — D509 Iron deficiency anemia, unspecified: Secondary | ICD-10-CM | POA: Diagnosis not present

## 2016-04-12 DIAGNOSIS — E785 Hyperlipidemia, unspecified: Secondary | ICD-10-CM | POA: Diagnosis not present

## 2016-04-12 DIAGNOSIS — E782 Mixed hyperlipidemia: Secondary | ICD-10-CM | POA: Diagnosis not present

## 2016-04-12 DIAGNOSIS — K219 Gastro-esophageal reflux disease without esophagitis: Secondary | ICD-10-CM | POA: Diagnosis not present

## 2016-04-12 DIAGNOSIS — I482 Chronic atrial fibrillation: Secondary | ICD-10-CM | POA: Diagnosis not present

## 2016-04-16 DIAGNOSIS — Z0001 Encounter for general adult medical examination with abnormal findings: Secondary | ICD-10-CM | POA: Diagnosis not present

## 2016-04-16 DIAGNOSIS — G894 Chronic pain syndrome: Secondary | ICD-10-CM | POA: Diagnosis not present

## 2016-04-16 DIAGNOSIS — D594 Other nonautoimmune hemolytic anemias: Secondary | ICD-10-CM | POA: Diagnosis not present

## 2016-04-16 DIAGNOSIS — R799 Abnormal finding of blood chemistry, unspecified: Secondary | ICD-10-CM | POA: Diagnosis not present

## 2016-04-16 DIAGNOSIS — I482 Chronic atrial fibrillation: Secondary | ICD-10-CM | POA: Diagnosis not present

## 2016-04-16 DIAGNOSIS — E782 Mixed hyperlipidemia: Secondary | ICD-10-CM | POA: Diagnosis not present

## 2016-04-16 DIAGNOSIS — K219 Gastro-esophageal reflux disease without esophagitis: Secondary | ICD-10-CM | POA: Diagnosis not present

## 2016-04-16 DIAGNOSIS — I10 Essential (primary) hypertension: Secondary | ICD-10-CM | POA: Diagnosis not present

## 2016-04-16 DIAGNOSIS — L89512 Pressure ulcer of right ankle, stage 2: Secondary | ICD-10-CM | POA: Diagnosis not present

## 2016-04-27 NOTE — Progress Notes (Signed)
Patient ID: [name], female   DOB: 05/10/1940, 76 y.o.   MRN: [id number]   [name] is seen for F/U of PAF, bruit, right subclavian steal and anticoagulatin She has known vascular disease primarily in the sublavian and carotids. She was admitted with syncope last year . She had PAF with diastolic heart failure.  EF was normal. 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 She has 60-79% stenosis on left ICA. In combination with rapid atrial fibrillation this may have contributed to a brief loss of consdousness. Her diastolic CHF cleared with restoration of NSR. She was sent home on low dose amiodarone. She has had no SOB, or palpitations since being home. there have been no bleeding problems.   Reviewed duplex from [facility]  0/27  25-36%  LICA stenosis. Innominate right occlusion with steal  Reviewed labs from 04/12/16  and AST/ALT much better in  Normal range  LDL 103   Seen in ER 2/16 with cough  Given Augmentin but CT with mass in RLL ? necrotising pneumonia or cancer  Reviewed  Has lost about 15 lbs  Looks weak  Indicates breathing OK  Has cavitary lesion in lungs followed by pulmonary  IMPRESSION:  CT 09/08/11  Reviewed  1. Near complete collapse of the right lower lobe cavitary lesion with residual pleural-parenchymal thickening. 2. New fine ground-glass nodules in the right middle lobe consistent with pulmonary infection. 3. Stable pleural-parenchymal thickening in right upper lobe. 4. Stable small left lower lobe pulmonary nodules.   02/20/15  Hospitalized with GI Bleed  ASA stopped and coumadin changed to eliquis per Dr Debara Pickett  Acute GI bleed: Patient presented with hemoglobin of 6.2, INR above 10 at the time of admission. - Patient was admitted to stepdown unit, transfused 4 units packed RBCs and 5 units of FFP through hospitalization. - Patient was on aspirin, Plavix, warfarin prior to admission. - Gastroenterology was  consulted. Patient underwent endoscopy on 11/9 which showed normal esophagus, 5cm HH, gastritis w/ numerous clean based linear ulcers of stomach, and a few clean based ulcers in the duodenal bulb -Continue Protonix twice a day as recommended by gastroenterology, avoid NSAIDs. Meloxicam discontinued. -H pylori screen negative    ROS: Denies fever, malais, weight loss, blurry vision, decreased visual acuity, cough, sputum, SOB, hemoptysis, pleuritic pain, palpitaitons, heartburn, abdominal pain, melena, lower extremity edema, claudication, or rash.  All other systems reviewed and negative  General: BP 80/palp right 120/80 left  Affect appropriate Chronically ill female  HEENT: normal Neck supple with no adenopathy JVP normal bilateral subclavian bruits and carotid  bruits no thyromegaly Lungs rhonchi decreased BS right base no  wheezing and good diaphragmatic motion Heart:  S1/S2 no murmur, no rub, gallop or click PMI normal Abdomen: benighn, BS positve, no tenderness, no AAA no bruit.  No HSM or HJR Distal pulses intact with no bruits No edema Neuro non-focal Skin warm and dry bruising  No muscular weakness   Current Outpatient Prescriptions  Medication Sig Dispense Refill  . albuterol (PROVENTIL HFA;VENTOLIN HFA) 108 (90 BASE) MCG/ACT inhaler Inhale 1-2 puffs into the lungs every 6 (six) hours as needed for wheezing or shortness of breath.    Marland Kitchen amiodarone (PACERONE) 200 MG tablet Take 1 tablet by mouth  daily 90 tablet 3  . amLODipine (NORVASC) 5 MG tablet Take 1 tablet (5 mg total) by mouth daily. 90 tablet 3  . apixaban (ELIQUIS) 5 MG  TABS tablet Take 1 tablet (5 mg total) by mouth 2 (two) times daily. 180 tablet 3  . calcium-vitamin D (OSCAL) 250-125 MG-UNIT per tablet Take 1 tablet by mouth 2 (two) times daily.      . clopidogrel (PLAVIX) 75 MG tablet Take 1 tablet by mouth  every morning 90 tablet 1  . ferrous sulfate 325 (65 FE) MG tablet Take 325 mg by mouth daily with  breakfast.    . HYDROcodone-acetaminophen (NORCO/VICODIN) 5-325 MG tablet Take 1 tablet by mouth every 6 (six) hours as needed for moderate pain. 20 tablet 0  . irbesartan (AVAPRO) 150 MG tablet Take 1 tablet (150 mg total) by mouth every evening. 90 tablet 2  . magnesium oxide (MAG-OX) 400 MG tablet Take 400 mg by mouth every morning.     . metoprolol tartrate (LOPRESSOR) 25 MG tablet Take 3 tablets by mouth two times daily 540 tablet 3  . Multiple Vitamin (MULTIVITAMIN WITH MINERALS) TABS tablet Take 1 tablet by mouth every morning.    . naloxegol oxalate (MOVANTIK) 25 MG TABS tablet Take 25 mg by mouth daily as needed (constipation).     Marland Kitchen oxyCODONE-acetaminophen (PERCOCET) 7.5-325 MG per tablet Take 1 tablet by mouth at bedtime as needed. For pain    . oxyCODONE-acetaminophen (PERCOCET/ROXICET) 5-325 MG tablet Take 1-2 tablets by mouth daily as needed for pain.    . pantoprazole (PROTONIX) 40 MG tablet Take 40 mg by mouth daily.    . pravastatin (PRAVACHOL) 20 MG tablet Take 1 tablet (20 mg total) by mouth daily. 90 tablet 3  . Tiotropium Bromide Monohydrate (SPIRIVA RESPIMAT) 2.5 MCG/ACT AERS Inhale 2 puffs into the lungs daily. 3 Inhaler 3  . vitamin C (ASCORBIC ACID) 500 MG tablet Take 500 mg by mouth every morning.      No current facility-administered medications for this visit.     Allergies  Iodine; Iohexol; and Tape  Electrocardiogram:  05/21/14  SR PR 276  LVH    Assessment and Plan Vascular: right innominate occlusion with steal moderate Left ICA disease.  F/u [facility] for duplex  PAF:  Continue eliquis lower to 2.5 bid if any further bleeding issues  Chol:  On statin.  Labs with primary  HTN:  On ARB well controlled  Pulmonary:  F/u CT in 3/17  Cavitary lung lesion not cancer This has been recurrent. 50 pack  Year history of smoking quit 10 years ago with Gold class 2 COPD f/u Ramaswamy  Edema:  Reviewed ABI's and venous duplex9/16 no DVT and circulation fine mild  lymphedema Decreased norvasc to 5 mg  GI Bleed:  ASA stopped continue protonix had gastritis and duodenal ulcers.  Lab Results  Component Value Date   HCT 40.5 01/19/2016   CAD:  No chest pain extensive by CT last cath 2011 with no hemodynamically significant disease Continue plavix not on ASA due to GI bleed and need for eliquis for PAF   F/u with me in 6 months   Jenkins Rouge

## 2016-05-03 ENCOUNTER — Encounter (INDEPENDENT_AMBULATORY_CARE_PROVIDER_SITE_OTHER): Payer: Self-pay | Admitting: *Deleted

## 2016-05-03 ENCOUNTER — Other Ambulatory Visit (INDEPENDENT_AMBULATORY_CARE_PROVIDER_SITE_OTHER): Payer: Self-pay | Admitting: *Deleted

## 2016-05-03 DIAGNOSIS — K259 Gastric ulcer, unspecified as acute or chronic, without hemorrhage or perforation: Secondary | ICD-10-CM

## 2016-05-03 DIAGNOSIS — K922 Gastrointestinal hemorrhage, unspecified: Secondary | ICD-10-CM

## 2016-05-07 ENCOUNTER — Encounter: Payer: Self-pay | Admitting: Cardiovascular Disease

## 2016-05-07 ENCOUNTER — Ambulatory Visit (INDEPENDENT_AMBULATORY_CARE_PROVIDER_SITE_OTHER): Payer: Medicare Other | Admitting: Cardiovascular Disease

## 2016-05-07 VITALS — BP 110/70 | HR 61 | Ht 64.0 in | Wt 115.1 lb

## 2016-05-07 DIAGNOSIS — I6523 Occlusion and stenosis of bilateral carotid arteries: Secondary | ICD-10-CM | POA: Diagnosis not present

## 2016-05-07 MED ORDER — AMLODIPINE BESYLATE 5 MG PO TABS: 5.0000 mg | ORAL_TABLET | Freq: Every day | ORAL | 3 refills | Status: DC

## 2016-05-07 NOTE — Patient Instructions (Addendum)

## 2016-05-08 ENCOUNTER — Other Ambulatory Visit: Payer: Self-pay | Admitting: *Deleted

## 2016-05-08 DIAGNOSIS — I6523 Occlusion and stenosis of bilateral carotid arteries: Secondary | ICD-10-CM

## 2016-05-14 DIAGNOSIS — J060 Acute laryngopharyngitis: Secondary | ICD-10-CM | POA: Diagnosis not present

## 2016-05-14 DIAGNOSIS — L89512 Pressure ulcer of right ankle, stage 2: Secondary | ICD-10-CM | POA: Diagnosis not present

## 2016-05-18 ENCOUNTER — Encounter (HOSPITAL_COMMUNITY): Payer: Medicare Other

## 2016-05-18 ENCOUNTER — Ambulatory Visit: Payer: Medicare Other

## 2016-05-21 ENCOUNTER — Ambulatory Visit: Payer: Medicare Other | Admitting: Internal Medicine

## 2016-05-24 ENCOUNTER — Other Ambulatory Visit: Payer: Self-pay | Admitting: Cardiovascular Disease

## 2016-05-28 DIAGNOSIS — K922 Gastrointestinal hemorrhage, unspecified: Secondary | ICD-10-CM | POA: Diagnosis not present

## 2016-05-28 DIAGNOSIS — K259 Gastric ulcer, unspecified as acute or chronic, without hemorrhage or perforation: Secondary | ICD-10-CM | POA: Diagnosis not present

## 2016-05-28 LAB — HEMOGLOBIN AND HEMATOCRIT, BLOOD
HCT: 39.9 % (ref 35.0–45.0)
Hemoglobin: 13.1 g/dL (ref 11.7–15.5)

## 2016-05-29 DIAGNOSIS — Z961 Presence of intraocular lens: Secondary | ICD-10-CM | POA: Diagnosis not present

## 2016-05-29 DIAGNOSIS — H5213 Myopia, bilateral: Secondary | ICD-10-CM | POA: Diagnosis not present

## 2016-05-29 DIAGNOSIS — H524 Presbyopia: Secondary | ICD-10-CM | POA: Diagnosis not present

## 2016-05-31 ENCOUNTER — Other Ambulatory Visit: Payer: Self-pay | Admitting: Cardiovascular Disease

## 2016-06-07 ENCOUNTER — Ambulatory Visit (INDEPENDENT_AMBULATORY_CARE_PROVIDER_SITE_OTHER): Payer: Medicare Other | Admitting: Internal Medicine

## 2016-06-07 ENCOUNTER — Encounter (INDEPENDENT_AMBULATORY_CARE_PROVIDER_SITE_OTHER): Payer: Self-pay | Admitting: Internal Medicine

## 2016-06-07 VITALS — BP 108/72 | HR 64 | Temp 97.8°F | Ht 64.0 in | Wt 113.3 lb

## 2016-06-07 DIAGNOSIS — K922 Gastrointestinal hemorrhage, unspecified: Secondary | ICD-10-CM

## 2016-06-07 DIAGNOSIS — I6523 Occlusion and stenosis of bilateral carotid arteries: Secondary | ICD-10-CM | POA: Diagnosis not present

## 2016-06-07 NOTE — Patient Instructions (Signed)
OV in 6 months. 

## 2016-06-07 NOTE — Progress Notes (Signed)
Subjective:    Patient ID: [name], female    DOB: [birth year], 76 y.o.   MRN: [id number]  HPI Here today for f/u. Hx of UGI bleed in November, 2017. Transferred from [facility] to [facility]. She had been taking ASA and Mobic for chronic back pain which has now been d/c. She received PRBCs and FFP. She underwent and EGD by Sheela Stack which revealed normal esophagus. 5cm hiatal hernia. Gastritis with numerous clean based ulcers of the stomach and a few cleaned based ulcers in the duodenal bulb.  H. Pylori was negative. She tells me she is doing fine. She is moving right along. She is ready for spring. Her appetite has been good. No black stools. She is having a BM once a day. She hs gained from 999 to 113.3.   Hx of atrial fib CAD and maintained on Eliquis and Plavix.   05/28/2016 H and H 13.1 and 39.9  Review of Systems Past Medical History:  Diagnosis Date  . Anemia 05/2014.  Marland Kitchen Atrial fibrillation ([place])    x 3 yrs  . Breast cancer ([place])    S/P left mastectomy and chemotherapy 1989 remained in remission  . Carotid artery occlusion   . Carotid bruit    LICA 63-87% (duplex 5/64)  . Degenerative arthritis of spine 2015   Chronic back pain  . Hyperlipidemia   . Hypertension   . Meningioma ([place])   . Osteoporosis   . Pneumonia May 19, 2014  . Stroke [facility])    CVA 2008  . Subclavian steal syndrome   . Ulcers of both lower extremities ([place]) 2015    Past Surgical History:  Procedure Laterality Date  . CARDIAC CATHETERIZATION    . CATARACT EXTRACTION Bilateral 2013  . COLONOSCOPY  11/17/2010   Procedure: COLONOSCOPY;  Surgeon: [name], MD;  Location: [facility];  Service: Endoscopy;  Laterality: N/A;  10:45 am  . ESOPHAGOGASTRODUODENOSCOPY  11/17/2010   Procedure: ESOPHAGOGASTRODUODENOSCOPY (EGD);  Surgeon: [name], MD;  Location: [facility];  Service: Endoscopy;  Laterality: N/A;  . ESOPHAGOGASTRODUODENOSCOPY N/A 02/16/2015   Procedure:  ESOPHAGOGASTRODUODENOSCOPY (EGD);  Surgeon: [name], MD;  Location: [place];  Service: Gastroenterology;  Laterality: N/A;  . EXPLORATORY LAPAROTOMY  1960s   For peritonitis of undetermined cause  . EYE SURGERY    . LUMBAR EPIDURAL INJECTION  06-2012--06-2013   pt. states she has had 5 epidurals in 06-2012----06-2013  . MASTECTOMY Left 1990  . YAG LASER APPLICATION Right 3/32/9518   Procedure: YAG LASER APPLICATION;  Surgeon: [name], MD;  Location: [facility];  Service: Ophthalmology;  Laterality: Right;  right  . YAG LASER APPLICATION Left 8/41/6606   Procedure: YAG LASER APPLICATION;  Surgeon: [name], MD;  Location: [facility];  Service: Ophthalmology;  Laterality: Left;    Allergies  Allergen Reactions  . Iodine Rash and Other (See Comments)    BETADINE Rash/burning, blisters on skin.  . Iohexol Rash and Other (See Comments)    Blisters; PT NEEDS 13-HOUR PREP   . Tape Itching and Rash    Prefers PAPER TAPE    Current Outpatient Prescriptions on File Prior to Visit  Medication Sig Dispense Refill  . albuterol (PROVENTIL HFA;VENTOLIN HFA) 108 (90 BASE) MCG/ACT inhaler Inhale 1-2 puffs into the lungs every 6 (six) hours as needed for wheezing or shortness of breath.    Marland Kitchen amiodarone (PACERONE) 200 MG tablet TAKE 1 TABLET BY MOUTH  DAILY 90 tablet 3  .  amLODipine (NORVASC) 5 MG tablet Take 1 tablet (5 mg total) by mouth daily. 90 tablet 3  . apixaban (ELIQUIS) 5 MG TABS tablet Take 1 tablet (5 mg total) by mouth 2 (two) times daily. 180 tablet 3  . calcium-vitamin D (OSCAL) 250-125 MG-UNIT per tablet Take 1 tablet by mouth 2 (two) times daily.      . clopidogrel (PLAVIX) 75 MG tablet TAKE 1 TABLET BY MOUTH  EVERY MORNING 90 tablet 3  . ferrous sulfate 325 (65 FE) MG tablet Take 325 mg by mouth daily with breakfast.    . irbesartan (AVAPRO) 150 MG tablet Take 1 tablet (150 mg total) by mouth every evening. 90 tablet 2  . magnesium oxide (MAG-OX) 400 MG tablet  Take 400 mg by mouth every morning.     . metoprolol tartrate (LOPRESSOR) 25 MG tablet TAKE 3 TABLETS BY MOUTH TWO TIMES DAILY 540 tablet 3  . Multiple Vitamin (MULTIVITAMIN WITH MINERALS) TABS tablet Take 1 tablet by mouth every morning.    . naloxegol oxalate (MOVANTIK) 25 MG TABS tablet Take 25 mg by mouth daily as needed (constipation).     Marland Kitchen oxyCODONE-acetaminophen (PERCOCET) 7.5-325 MG per tablet Take 1 tablet by mouth at bedtime as needed. For pain    . pantoprazole (PROTONIX) 40 MG tablet Take 40 mg by mouth daily.    . pravastatin (PRAVACHOL) 20 MG tablet TAKE 1 TABLET BY MOUTH  DAILY 90 tablet 3  . Tiotropium Bromide Monohydrate (SPIRIVA RESPIMAT) 2.5 MCG/ACT AERS Inhale 2 puffs into the lungs daily. 3 Inhaler 3  . vitamin C (ASCORBIC ACID) 500 MG tablet Take 500 mg by mouth every morning.     Marland Kitchen oxyCODONE-acetaminophen (PERCOCET/ROXICET) 5-325 MG tablet Take 1-2 tablets by mouth daily as needed for pain.     No current facility-administered medications on file prior to visit.        Objective:   Physical Exam Blood pressure 108/72, pulse 64, temperature 97.8 F (36.6 C), height '5\' 4"'$  (1.626 m), weight 113 lb 4.8 oz (51.4 kg). Alert and oriented. Skin warm and dry. Oral mucosa is moist.   . Sclera anicteric, conjunctivae is pink. Thyroid not enlarged. No cervical lymphadenopathy. Lungs clear. Heart regular rate and rhythm.  Abdomen is soft. Bowel sounds are positive. No hepatomegaly. No abdominal masses felt. No tenderness.  No edema to lower extremities.           Assessment & Plan:  UGI bleed in setting of anticoaulant therapy. Presently taking Plavix and Eliquis.  Hemoglobin remains stable. OV in 6 months. Continue the Protonix.

## 2016-06-27 ENCOUNTER — Encounter: Payer: Self-pay | Admitting: Internal Medicine

## 2016-06-27 ENCOUNTER — Ambulatory Visit (INDEPENDENT_AMBULATORY_CARE_PROVIDER_SITE_OTHER): Payer: Medicare Other | Admitting: Internal Medicine

## 2016-06-27 DIAGNOSIS — I6523 Occlusion and stenosis of bilateral carotid arteries: Secondary | ICD-10-CM

## 2016-06-27 DIAGNOSIS — J449 Chronic obstructive pulmonary disease, unspecified: Secondary | ICD-10-CM | POA: Diagnosis not present

## 2016-06-27 NOTE — Patient Instructions (Signed)
COPD, moderate ([place]) Stable COPD  Plan - Flu shot in fall 2018 - Continue Spiriva but please ensure that he take it daily - Use albuterol as needed   follow-up - 9 months or sooner if needed

## 2016-06-27 NOTE — Progress Notes (Signed)
Subjective:     Patient ID: [name], female   DOB: 23-Dec-1940, 76 y.o.   MRN: [id number]  HPI    OV 11/22/2015  Chief Complaint  Patient presents with  . Follow-up    Pt states that she tries to take the [place] Resp daily - sometimes she forgets. Denies current breathing issues. Pt states that she does not have to use the Albuterol HFA.    76 year old female former smoker with significant neuromuscular disability due to prior stroke, spinal issues.    STUDIES:  CT chest (05/2014) Masslike area of consolidation RLL, r hilar and mediastinal LAN CT chest (06/2014) Significant improvement of RLL opacification, cavitary lesion persists suggestive of resolving PNA with necrosis.  CT chest (09/2014) Near complete collapse of the right lower lobe cavitary lesion with residual pleural-parenchymal thickening. New fine ground-glass nodules in the right middle lobe consistent with pulmonary infection. CT chest (02/15/2015) Recurrent to rounded masslike area of consolidation in the right lower lobe. Given that this area was present previously then resolved and has recurrent quickly, this is most compatible with rounded pneumonia. Small right pleural effusion with adjacent right lower lobe atelectasis or consolidation. Mildly enlarged precarinal lymph node, likely reactive. CT chest 04/13/15 >Interval improvement in masslike consolidation in the right lower lobe, without complete resolution, favoring resolving pneumonia. 2. Locule of air at the lateral base of the right hemi thorax may be within necrotic lung. Bronchopleural fistula cannot be definitively excluded. 3. Small, partially loculated right pleural effusion. 4. 4 mm left lower lobe nodule, stable.    PCP Delphina Cahill, MD REferrd by dr Johnsie Cancel HPI  IOV 07/09/2014  Chief Complaint  Patient presents with  . Pulmonary Consult    Pt referred by [name] fpr abnormal CT. Pt denies SOB, cough, and CP/tightness. Pt stated she went to  hospital for pna on 05/20/14.     76 year old female with significant neuromuscular disability due to prior stroke, spinal issues. Previous greater than 50 pack smoker. Was admitted around 05/20/2014 with significant right lower lobe pneumonia/huge masslike appearance on CT scan of the chest. Apparently she was extensively asymptomatic at this point in time. Treated with antibiotics. She had follow-up CT scan of the chest which shows significant improvement in the right lower lobe opacity but with a residual 5.2 cm thick-walled cavitary lesion. There for she's here for follow-up. At baseline she reports no dyspnea but then she is extremely disabled and is only able to move a little bit with pushing a wheelchair and this is because of a spinal issues. She only rarely feels short of breath and manages with pro-air when necessary. She's never had lung function test in the past. She denies any cough. Currently she feels better     OV 09/29/2014  Chief Complaint  Patient presents with  . Follow-up    Pt here after CT scan. Pt states her breathing is unchanged since last OV. Pt has left ankle edema dt recent sprain, pt is seeing PCP for the issue.     Follow-up   - Right lower lobe lung cavity following pneumonia February 2016: She had CT scan of the chest June 2016. This showed near resolution of the cavity and it is down to a scar tissue. I personally visualized image dated 09/08/2014  - New issue: CT scan of the chest 09/08/2014 compared to CT scans of the chest March 2016 show some new right middle lobe groundglass nodules that are extremely small. She denies she was  sick at that time  - Other issue: History of smoking with shortness of breath: She denies any shortness of breath or cough but on deeper questioning she does admit to occasional shortness of breath that is very rare. She uses pro-air may be once every 2 weeks. She feels good. She's never had pulmonary function testing. She has a  history of 56 pack smoking history but is quit. She does not want to pulmonary function test now but will do it in the future   06/03/2015 Acute OV : Cavitary PNA  Pt presents for work in visit. Was seen 1 month ago . She is recovering from recurrent cavitary PNA on the right.  Previous episode in 05/2014 with right sided mass like consolidation. She was treated with abx. Serial CT showed near complete resolution on CT chest 09/2014.  Admitted in Nov 2016 with acute GI bleed and cavitary lung lesion. EGD showed a normal esophagus and gastritis with numerous clean, linear ulcers of the stomach and a few ulcers of the duodenal bulb. She did stabilize and was transitioned back on Eliquis 5mg  Twice daily   Prior to discharge CT chest showed a recurrent rounded masslike area of consolidation on the right lower lobe measuring 9.5 x 6.7 cm.  She was treated with aggressive ABX . Follow up CT chest in Jan showed interval improvement in RLL consolidation. Clinically she has been slowly improving.  She is very frail and has previous stroke years ago. She is on chronic pain meds as well for back issues.  She denies any swallow issues but does admit she eats in bed often. Says this week has not felt as good  Says that her right side was hurting when she lied down in bed and not as much energy. Feels much better today. CXR today shows further improvement in RLL opacity . No new areas noted.  She denies any bleeding , n/v/d , fever or chest pain. , orthopnea, hemoptysis , or edema.  Has ov with PCP next week for labs.  Last labs last month w/ improved H/H .    OV 07/12/2015  Chief Complaint  Patient presents with  . Follow-up    Pt last seen on 06/03/15 for pna. Pt states she is feeling well now. Pt deines SOB, cough, wheezing, CP/tightness, f/c/s.     Follow-up recurrent pneumonia. Most recently 6 weeks ago she saw nurse practitioner. She says she is now recovered from respiratory infection. She and her  husband state that she gets recurrent respiratory infection and is looking at ways to prevent it. Pulmonary function test today shows gold stage II COPD. CT scan chest as a follow-up from pneumonia 2 years ago shows that things have resolved to scar tissue. There is no lung mass. Overall she's asymptomatic from a respiratory standpoint at baseline but then she is largely disabled because of spinal issues and does not exert much.  CT and PFT were personally visualized  Pulmonary function test 07/08/2015 FEV1 1.44 L/70%. No post bronchodilator response. FVC 2.1 L/76% and a ratio 69 consistent with Gold stage II COPD. Total lung capacity is 98%. DLCO is reduced at 11.33/46%  Ct Chest Wo Contrast  07/11/2015  CLINICAL DATA:  Lung nodules. EXAM: CT CHEST WITHOUT CONTRAST TECHNIQUE: Multidetector CT imaging of the chest was performed following the standard protocol without IV contrast. COMPARISON:  CT chest exams dating back to 01/15/2008. FINDINGS: Mediastinum/Nodes: Sub cm low-attenuation lesion in the right lobe of the thyroid is incidentally noted.  Mediastinal lymph nodes are not enlarged by CT size criteria. Hilar regions are difficult to definitively evaluate without IV contrast. No axillary adenopathy. Surgical clips in the left axilla. Atherosclerotic calcification of the arterial vasculature. Dense calcification and narrowing of the right brachiocephalic artery. Three-vessel coronary artery calcification. Heart size normal. No pericardial effusion. Small hiatal hernia. Calcified subcarinal lymph node. Lungs/Pleura: Right apical pleural parenchymal scarring. Mild centrilobular emphysema. Post infectious scarring in the right middle and right lower lobes. A few scattered pulmonary nodules measure 4 mm or less in size, unchanged. Some are calcified. No pleural fluid. Airway is unremarkable. Upper abdomen: Visualized portions of the liver, adrenal glands unremarkable. Probable renal vascular calcifications  bilaterally. Difficult to exclude renal stones as well. Visualized portions of the spleen, pancreas and stomach are grossly unremarkable. Musculoskeletal: Degenerative changes are seen in the spine. No worrisome lytic or sclerotic lesions. Lower thoracic compression fractures, as before. IMPRESSION: 1. Post infectious scarring in the right middle and right lower lobes. 2. Three-vessel coronary artery calcification. 3. Advanced atherosclerotic calcification and narrowing of the proximal right brachiocephalic artery. Electronically Signed   By: Lorin Picket M.D.   On: 07/11/2015 11:25    OV 11/22/2015  Chief Complaint  Patient presents with  . Follow-up    Pt states that she tries to take the [place] Resp daily - sometimes she forgets. Denies current breathing issues. Pt states that she does not have to use the Albuterol HFA.     Follow-up Gold stage II COPD in the setting of neuromuscular issues due to spine and stroke. Currently on Spiriva.she is here with her husband. Overall she's doing well. She never uses albuterol for rescue since her last visit. No interim exacerbations or urgent care visits. Sleeps well. She's not interested in pulmonary rehabilitation. She continues to Spiriva. She takes flu shot in the fall and is willing to do so again.    has a past medical history of Anemia (05/2014.); Atrial fibrillation ([place]); Breast cancer ([place]); Carotid artery occlusion; Carotid bruit; Degenerative arthritis of spine (2015); Hyperlipidemia; Hypertension; Meningioma ([place]); Osteoporosis; Pneumonia (May 19, 2014); Stroke [facility]); Subclavian steal syndrome; and Ulcers of both lower extremities ([place]) (2015).   reports that she quit smoking about 9 years ago. Her smoking use included Cigarettes. She has a 56.00 pack-year smoking history. She has never used smokeless tobacco.   OV 06/27/2016  Chief Complaint  Patient presents with  . Follow-up    pt states she is doing well at this time, denies  any breathing complaints.     Follow-up moderate COPD on single agent Spiriva. Last seen August 2018. She is here with her husband. In the interim no COPD exacerbations of prednisone use or antibody use or admissions to the hospital emergency room visits for any reason. She is followed up with the gastroenterologist and neurologist for previous histories of atherosclerosis and GI bleeds. She is here with her husband. She did admit that she is not fully compliant with the Spiriva but she stable.     has a past medical history of Anemia (05/2014.); Atrial fibrillation ([place]); Breast cancer ([place]); Carotid artery occlusion; Carotid bruit; Degenerative arthritis of spine (2015); Hyperlipidemia; Hypertension; Meningioma ([place]); Osteoporosis; Pneumonia (May 19, 2014); Stroke [facility]); Subclavian steal syndrome; and Ulcers of both lower extremities ([place]) (2015).   reports that she quit smoking about 9 years ago. Her smoking use included Cigarettes. She has a 56.00 pack-year smoking history. She has never used smokeless tobacco.  Past Surgical History:  Procedure Laterality Date  . CARDIAC CATHETERIZATION    . CATARACT EXTRACTION Bilateral 2013  . COLONOSCOPY  11/17/2010   Procedure: COLONOSCOPY;  Surgeon: [name], MD;  [facility];  Service: Endoscopy;  Laterality: N/A;  10:45 am  . ESOPHAGOGASTRODUODENOSCOPY  11/17/2010   Procedure: ESOPHAGOGASTRODUODENOSCOPY (EGD);  Surgeon: [name], MD;  [facility];  Service: Endoscopy;  Laterality: N/A;  . ESOPHAGOGASTRODUODENOSCOPY N/A 02/16/2015   Procedure: ESOPHAGOGASTRODUODENOSCOPY (EGD);  Surgeon: [name], MD;  Location: [facility];  Service: Gastroenterology;  Laterality: N/A;  . EXPLORATORY LAPAROTOMY  1960s   For peritonitis of undetermined cause  . EYE SURGERY    . LUMBAR EPIDURAL INJECTION  06-2012--06-2013   pt. states she has had 5 epidurals in 06-2012----06-2013  . MASTECTOMY Left 1990  . YAG  LASER APPLICATION Right 9/38/1017   Procedure: YAG LASER APPLICATION;  Surgeon: [name], MD;  Location: [facility];  Service: Ophthalmology;  Laterality: Right;  right  . YAG LASER APPLICATION Left 08/17/2583   Procedure: YAG LASER APPLICATION;  Surgeon: [name], MD;  Location: [facility];  Service: Ophthalmology;  Laterality: Left;    Allergies  Allergen Reactions  . Iodine Rash and Other (See Comments)    BETADINE Rash/burning, blisters on skin.  . Iohexol Rash and Other (See Comments)    Blisters; PT NEEDS 13-HOUR PREP   . Tape Itching and Rash    Prefers PAPER TAPE    Immunization History  Administered Date(s) Administered  . Influenza Split 01/07/2014  . Influenza, High Dose Seasonal PF 01/28/2016  . Influenza,inj,Quad PF,36+ Mos 01/12/2015  . Influenza-Unspecified 01/07/2013  . Pneumococcal Conjugate-13 01/07/2014  . Tdap 08/01/2011    Family History  Problem Relation Age of Onset  . Alzheimer's disease Mother   . Dementia Mother   . Hypertension Mother   . Hyperlipidemia Mother   . Parkinsonism Father      Current Outpatient Prescriptions:  .  amiodarone (PACERONE) 200 MG tablet, TAKE 1 TABLET BY MOUTH  DAILY, Disp: 90 tablet, Rfl: 3 .  amLODipine (NORVASC) 5 MG tablet, Take 1 tablet (5 mg total) by mouth daily., Disp: 90 tablet, Rfl: 3 .  apixaban (ELIQUIS) 5 MG TABS tablet, Take 1 tablet (5 mg total) by mouth 2 (two) times daily., Disp: 180 tablet, Rfl: 3 .  calcium-vitamin D (OSCAL) 250-125 MG-UNIT per tablet, Take 1 tablet by mouth 2 (two) times daily.  , Disp: , Rfl:  .  clopidogrel (PLAVIX) 75 MG tablet, TAKE 1 TABLET BY MOUTH  EVERY MORNING, Disp: 90 tablet, Rfl: 3 .  ferrous sulfate 325 (65 FE) MG tablet, Take 325 mg by mouth daily with breakfast., Disp: , Rfl:  .  irbesartan (AVAPRO) 150 MG tablet, Take 1 tablet (150 mg total) by mouth every evening. (Patient taking differently: Take 150 mg by mouth 2 (two) times daily. ), Disp: 90 tablet, Rfl: 2 .   magnesium oxide (MAG-OX) 400 MG tablet, Take 400 mg by mouth every morning. , Disp: , Rfl:  .  metoprolol tartrate (LOPRESSOR) 25 MG tablet, TAKE 3 TABLETS BY MOUTH TWO TIMES DAILY, Disp: 540 tablet, Rfl: 3 .  Multiple Vitamin (MULTIVITAMIN WITH MINERALS) TABS tablet, Take 1 tablet by mouth every morning., Disp: , Rfl:  .  naloxegol oxalate (MOVANTIK) 25 MG TABS tablet, Take 25 mg by mouth daily as needed (constipation). , Disp: , Rfl:  .  oxyCODONE-acetaminophen (PERCOCET/ROXICET) 5-325 MG tablet, Take 1-2 tablets by mouth daily as  needed for pain., Disp: , Rfl:  .  pantoprazole (PROTONIX) 40 MG tablet, Take 40 mg by mouth daily., Disp: , Rfl:  .  pravastatin (PRAVACHOL) 20 MG tablet, TAKE 1 TABLET BY MOUTH  DAILY, Disp: 90 tablet, Rfl: 3 .  Tiotropium Bromide Monohydrate (SPIRIVA RESPIMAT) 2.5 MCG/ACT AERS, Inhale 2 puffs into the lungs daily., Disp: 3 Inhaler, Rfl: 3 .  vitamin C (ASCORBIC ACID) 500 MG tablet, Take 500 mg by mouth every morning. , Disp: , Rfl:     Review of Systems     Objective:   Physical Exam  Constitutional: She is oriented to person, place, and time. She appears well-developed and well-nourished. No distress.  HENT:  Head: Normocephalic and atraumatic.  Right Ear: External ear normal.  Left Ear: External ear normal.  Mouth/Throat: Oropharynx is clear and moist. No oropharyngeal exudate.  Eyes: Conjunctivae and EOM are normal. Pupils are equal, round, and reactive to light. Right eye exhibits no discharge. Left eye exhibits no discharge. No scleral icterus.  Neck: Normal range of motion. Neck supple. No JVD present. No tracheal deviation present. No thyromegaly present.  Cardiovascular: Normal rate, regular rhythm, normal heart sounds and intact distal pulses.  Exam reveals no gallop and no friction rub.   No murmur heard. Pulmonary/Chest: Effort normal and breath sounds normal. No respiratory distress. She has no wheezes. She has no rales. She exhibits no  tenderness.  Abdominal: Soft. Bowel sounds are normal. She exhibits no distension and no mass. There is no tenderness. There is no rebound and no guarding.  Musculoskeletal: Normal range of motion. She exhibits no edema or tenderness.  Lymphadenopathy:    She has no cervical adenopathy.  Neurological: She is alert and oriented to person, place, and time. She has normal reflexes. No cranial nerve deficit. She exhibits normal muscle tone. Coordination normal.  Uses cane  Skin: Skin is warm and dry. No rash noted. She is not diaphoretic. No erythema. No pallor.  Psychiatric: She has a normal mood and affect. Her behavior is normal. Judgment and thought content normal.  Vitals reviewed.  Vitals:   06/27/16 1032  BP: 126/62  Pulse: 60  SpO2: 93%  Weight: 115 lb (52.2 kg)  Height: '5\' 4"'$  (1.626 m)    Estimated body mass index is 19.74 kg/m as calculated from the following:   Height as of this encounter: '5\' 4"'$  (1.626 m).   Weight as of this encounter: 115 lb (52.2 kg).       Assessment:       ICD-9-CM ICD-10-CM   1. COPD, moderate ([place]) 496 J44.9        Plan:     COPD, moderate ([place]) Stable COPD  Plan - Flu shot in fall 2018 - Continue Spiriva but please ensure that he take it daily - Use albuterol as needed   follow-up - 9 months or sooner if needed    Dr. Brand Males, M.D., [facility].C.P Pulmonary and Critical Care Medicine Staff Physician [facility] Pager: [phone], If no answer or between  15:00h - 7:00h: call [phone]  06/27/2016 11:28 AM   '

## 2016-06-27 NOTE — Assessment & Plan Note (Signed)
Stable COPD  Plan - Flu shot in fall 2018 - Continue Spiriva but please ensure that he take it daily - Use albuterol as needed   follow-up - 9 months or sooner if needed

## 2016-07-02 ENCOUNTER — Encounter (HOSPITAL_COMMUNITY): Payer: Medicare Other

## 2016-07-02 ENCOUNTER — Ambulatory Visit: Payer: Medicare Other | Admitting: Family

## 2016-07-26 ENCOUNTER — Ambulatory Visit: Payer: Medicare Other | Admitting: Family

## 2016-07-26 ENCOUNTER — Encounter (HOSPITAL_COMMUNITY): Payer: Medicare Other

## 2016-08-07 DIAGNOSIS — L89512 Pressure ulcer of right ankle, stage 2: Secondary | ICD-10-CM | POA: Diagnosis not present

## 2016-09-17 ENCOUNTER — Encounter: Payer: Self-pay | Admitting: Family

## 2016-09-24 ENCOUNTER — Ambulatory Visit (INDEPENDENT_AMBULATORY_CARE_PROVIDER_SITE_OTHER): Payer: Medicare Other | Admitting: Family

## 2016-09-24 ENCOUNTER — Ambulatory Visit (HOSPITAL_COMMUNITY)
Admission: RE | Admit: 2016-09-24 | Discharge: 2016-09-24 | Disposition: A | Discharge status: Home / Self Care | Payer: Medicare Other | Source: Ambulatory Visit | Attending: Surgery | Admitting: Surgery

## 2016-09-24 ENCOUNTER — Encounter: Payer: Self-pay | Admitting: Family

## 2016-09-24 VITALS — BP 130/69 | HR 56 | Temp 97.2°F | Resp 18 | Ht 64.0 in | Wt 115.0 lb

## 2016-09-24 DIAGNOSIS — I6523 Occlusion and stenosis of bilateral carotid arteries: Secondary | ICD-10-CM | POA: Diagnosis not present

## 2016-09-24 LAB — VAS US CAROTID
LEFT ECA DIAS: -26 cm/s
LEFT VERTEBRAL DIAS: -17 cm/s
LICADDIAS: -24 cm/s
LICAPDIAS: 53 cm/s
Left CCA dist dias: 22 cm/s
Left CCA dist sys: 82 cm/s
Left CCA prox dias: 20 cm/s
Left CCA prox sys: 82 cm/s
Left ICA dist sys: -87 cm/s
Left ICA prox sys: 188 cm/s
RCCADSYS: -35 cm/s
RIGHT CCA MID DIAS: 20 cm/s
RIGHT VERTEBRAL DIAS: 15 cm/s

## 2016-09-24 NOTE — Progress Notes (Signed)
Chief Complaint: Follow up Extracranial Carotid Artery Stenosis   History of Present Illness  [name] is a 76 y.o. female patient of Dr. Trula Slade with no history of carotid intervention comes in for carotid duplex surveillance, has known right innominate artery occlusion. She denies tingling, numbness, pain or weakness in either hand or arm. Patient has orthostatic hypotension if stands up quickly, but denies dizziness otherwise. Patient denies history of amaurosis fugax, denies hemiplegia. She had a stroke in May, 2008 as manifested by dysphagia and aphasia which have resolved. She has a history of atrial fib followed by [name].   She has had ESI's in the past for spinal stenosis and scoliosis, OA of the spine, DDD, and sciatic pain that is intermittent. These conditions limit her ambulation.  She has seen Dr. Hall Busing, neurologist, in the past.  She had her liver evaluated, states she has ongoing evaluation for slightly elevated liver enzymes. Hospitalized at [facility] February 2016 with pneumonia. She sees a pulmonologist, Dr. Chase Caller, for a lesion found in her lungs, has had a CT, will have another CT September 08, 2014. Left foot drop from back issues per pt; she wears a brace to prevent foot drop.  Pt Diabetic: No Pt smoker: former smoker, quit 2008  Pt meds include: Statin : Yes Betablocker: yes ASA: Yes Other anticoagulants/antiplatelets: Eliquis for atrial fib, also taking Plavix   Past Medical History:  Diagnosis Date  . Anemia 05/2014.  Marland Kitchen Atrial fibrillation ([place])    x 3 yrs  . Breast cancer ([place])    S/P left mastectomy and chemotherapy 1989 remained in remission  . Carotid artery occlusion   . Carotid bruit    LICA 02-54% (duplex 2/70)  . Degenerative arthritis of spine 2015   Chronic back pain  . Hyperlipidemia   . Hypertension   . Meningioma ([place])   . Osteoporosis   . Pneumonia May 19, 2014  . Stroke [facility])    CVA 2008  . Subclavian steal syndrome    . Ulcers of both lower extremities ([place]) 2015    Social History Social History  Substance Use Topics  . Smoking status: Former Smoker    Packs/day: 1.00    Years: 56.00    Types: Cigarettes    Quit date: 08/14/2006  . Smokeless tobacco: Never Used     Comment: quit in 2008  . Alcohol use No    Family History Family History  Problem Relation Age of Onset  . Alzheimer's disease Mother   . Dementia Mother   . Hypertension Mother   . Hyperlipidemia Mother   . Parkinsonism Father     Surgical History Past Surgical History:  Procedure Laterality Date  . CARDIAC CATHETERIZATION    . CATARACT EXTRACTION Bilateral 2013  . COLONOSCOPY  11/17/2010   Procedure: COLONOSCOPY;  Surgeon: [name], MD;  [facility];  Service: Endoscopy;  Laterality: N/A;  10:45 am  . ESOPHAGOGASTRODUODENOSCOPY  11/17/2010   Procedure: ESOPHAGOGASTRODUODENOSCOPY (EGD);  Surgeon: [name], MD;  [facility];  Service: Endoscopy;  Laterality: N/A;  . ESOPHAGOGASTRODUODENOSCOPY N/A 02/16/2015   Procedure: ESOPHAGOGASTRODUODENOSCOPY (EGD);  Surgeon: [name], MD;  Location: [place];  Service: Gastroenterology;  Laterality: N/A;  . EXPLORATORY LAPAROTOMY  1960s   For peritonitis of undetermined cause  . EYE SURGERY    . LUMBAR EPIDURAL INJECTION  06-2012--06-2013   pt. states she has had 5 epidurals in 06-2012----06-2013  . MASTECTOMY Left 1990  .  YAG LASER APPLICATION Right 7/49/4496   Procedure: YAG LASER APPLICATION;  Surgeon: [name], MD;  Location: [facility];  Service: Ophthalmology;  Laterality: Right;  right  . YAG LASER APPLICATION Left 7/59/1638   Procedure: YAG LASER APPLICATION;  Surgeon: [name], MD;  Location: [facility];  Service: Ophthalmology;  Laterality: Left;    Allergies  Allergen Reactions  . Iodine Rash and Other (See Comments)    BETADINE Rash/burning, blisters on skin.  . Iohexol Rash and Other (See Comments)    Blisters;  PT NEEDS 13-HOUR PREP   . Tape Itching and Rash    Prefers PAPER TAPE    Current Outpatient Prescriptions  Medication Sig Dispense Refill  . amiodarone (PACERONE) 200 MG tablet TAKE 1 TABLET BY MOUTH  DAILY 90 tablet 3  . amLODipine (NORVASC) 5 MG tablet Take 1 tablet (5 mg total) by mouth daily. 90 tablet 3  . apixaban (ELIQUIS) 5 MG TABS tablet Take 1 tablet (5 mg total) by mouth 2 (two) times daily. 180 tablet 3  . calcium-vitamin D (OSCAL) 250-125 MG-UNIT per tablet Take 1 tablet by mouth 2 (two) times daily.      . clopidogrel (PLAVIX) 75 MG tablet TAKE 1 TABLET BY MOUTH  EVERY MORNING 90 tablet 3  . ferrous sulfate 325 (65 FE) MG tablet Take 325 mg by mouth daily with breakfast.    . irbesartan (AVAPRO) 150 MG tablet Take 1 tablet (150 mg total) by mouth every evening. (Patient taking differently: Take 150 mg by mouth 2 (two) times daily. ) 90 tablet 2  . magnesium oxide (MAG-OX) 400 MG tablet Take 400 mg by mouth every morning.     . metoprolol tartrate (LOPRESSOR) 25 MG tablet TAKE 3 TABLETS BY MOUTH TWO TIMES DAILY 540 tablet 3  . Multiple Vitamin (MULTIVITAMIN WITH MINERALS) TABS tablet Take 1 tablet by mouth every morning.    . naloxegol oxalate (MOVANTIK) 25 MG TABS tablet Take 25 mg by mouth daily as needed (constipation).     Marland Kitchen oxyCODONE-acetaminophen (PERCOCET/ROXICET) 5-325 MG tablet Take 1-2 tablets by mouth daily as needed for pain.    . pantoprazole (PROTONIX) 40 MG tablet Take 40 mg by mouth daily.    . pravastatin (PRAVACHOL) 20 MG tablet TAKE 1 TABLET BY MOUTH  DAILY 90 tablet 3  . Tiotropium Bromide Monohydrate (SPIRIVA RESPIMAT) 2.5 MCG/ACT AERS Inhale 2 puffs into the lungs daily. 3 Inhaler 3  . vitamin C (ASCORBIC ACID) 500 MG tablet Take 500 mg by mouth every morning.      No current facility-administered medications for this visit.     Review of Systems : See HPI for pertinent positives and negatives.  Physical Examination  Vitals:   09/24/16 1235  BP:  130/69  Pulse: (!) 56  Resp: 18  Temp: 97.2 F (36.2 C)  TempSrc: Oral  SpO2: 98%  Weight: 115 lb (52.2 kg)  Height: 5\' 4"  (1.626 m)   Body mass index is 19.74 kg/m.  General: WDWN thin female in NAD GAIT: seated in wheelchair Eyes: PERRLA Pulmonary: Diminished air movement in all fields, no rales, rhonchi or wheezing. + moist cough.  Cardiac: regular Rhythm.  VASCULAR EXAM Carotid Bruits Left Right   Positive Positive    Aorta is not palpable. Radial pulses are 2+ palpable and equal.      LE Pulses LEFT RIGHT   POPLITEAL not palpable   palpable   POSTERIOR TIBIAL  not palpable   not palpable  DORSALIS PEDIS  ANTERIOR TIBIAL palpable  faintly palpable     Gastrointestinal: soft, nontender, BS WNL, no r/g, no palpable masses.  Musculoskeletal: Nomuscle atrophy/wasting. M/S 4/5 throughout, Extremities without ischemic changes.  Legs and arms have multiple bruises secondary to Eliquis/Plavix/ASA use.  Neurologic: A&O X 3; Appropriate Affect, left lower leg/foot brace to prevent foot drop,speech is normal, CN 2-12 intact, Pain and light touch intact in extremities, Motor exam as listed above    Assessment: JESLYNN HOLLANDER is a 76 y.o. female who has known right innominate artery occlusion. She denies tingling, numbness, pain or weakness in either hand or arm. She has orthostatic hypotension if stands up quickly, but denies dizziness otherwise. Patient denies history of amaurosis fugax, denies hemiplegia. She had a stroke in May, 2008 as manifested by dysphagia and aphasia which have resolved. She has a history of atrial fib followed by [name].   DATA Carotid Duplex (09/24/16): 1 - 39% right internal carotid artery stenosis,  40 - 59% left internal carotid  artery stenosis, and right vertebral artery flow is retrograde due to steal. Waveforms throughout the right side are monophasic due to known innominate occlusion.  Left subclavian waveforms are biphasic. No significant change from 08-16-14 Duplex.  12-24-14 Left Venous Duplex to evaluate left leg swelling: No DVT or superficial venous thrombosis.   12-24-14 ABI: Right: 0.86 (no previous for comparison) with biphasic waveforms; TBI: 0/70 Left: 1.14 with triphasic waveforms, TBI: 0.96  Plan: Follow-up in 1 year with Carotid Duplex.    I discussed in depth with the patient the nature of atherosclerosis, and emphasized the importance of maximal medical management including strict control of blood pressure, blood glucose, and lipid levels, obtaining regular exercise, and continued cessation of smoking.  The patient is aware that without maximal medical management the underlying atherosclerotic disease process will progress, limiting the benefit of any interventions. The patient was given information about stroke prevention and what symptoms should prompt the patient to seek immediate medical care. Thank you for allowing [place] to participate in this patient's care.  Clemon Chambers, RN, MSN, FNP-C [facility] Office: [facility] Physician: Trula Slade  09/24/16 7:05 PM

## 2016-09-24 NOTE — Patient Instructions (Signed)
Stroke Prevention Some medical conditions and behaviors are associated with an increased chance of having a stroke. You may prevent a stroke by making healthy choices and managing medical conditions. How can I reduce my risk of having a stroke?  Stay physically active. Get at least 30 minutes of activity on most or all days.  Do not smoke. It may also be helpful to avoid exposure to secondhand smoke.  Limit alcohol use. Moderate alcohol use is considered to be:  No more than 2 drinks per day for men.  No more than 1 drink per day for nonpregnant women.  Eat healthy foods. This involves:  Eating 5 or more servings of fruits and vegetables a day.  Making dietary changes that address high blood pressure (hypertension), high cholesterol, diabetes, or obesity.  Manage your cholesterol levels.  Making food choices that are high in fiber and low in saturated fat, trans fat, and cholesterol may control cholesterol levels.  Take any prescribed medicines to control cholesterol as directed by your health care provider.  Manage your diabetes.  Controlling your carbohydrate and sugar intake is recommended to manage diabetes.  Take any prescribed medicines to control diabetes as directed by your health care provider.  Control your hypertension.  Making food choices that are low in salt (sodium), saturated fat, trans fat, and cholesterol is recommended to manage hypertension.  Ask your health care provider if you need treatment to lower your blood pressure. Take any prescribed medicines to control hypertension as directed by your health care provider.  If you are 18-39 years of age, have your blood pressure checked every 3-5 years. If you are 40 years of age or older, have your blood pressure checked every year.  Maintain a healthy weight.  Reducing calorie intake and making food choices that are low in sodium, saturated fat, trans fat, and cholesterol are recommended to manage  weight.  Stop drug abuse.  Avoid taking birth control pills.  Talk to your health care provider about the risks of taking birth control pills if you are over 35 years old, smoke, get migraines, or have ever had a blood clot.  Get evaluated for sleep disorders (sleep apnea).  Talk to your health care provider about getting a sleep evaluation if you snore a lot or have excessive sleepiness.  Take medicines only as directed by your health care provider.  For some people, aspirin or blood thinners (anticoagulants) are helpful in reducing the risk of forming abnormal blood clots that can lead to stroke. If you have the irregular heart rhythm of atrial fibrillation, you should be on a blood thinner unless there is a good reason you cannot take them.  Understand all your medicine instructions.  Make sure that other conditions (such as anemia or atherosclerosis) are addressed. Get help right away if:  You have sudden weakness or numbness of the face, arm, or leg, especially on one side of the body.  Your face or eyelid droops to one side.  You have sudden confusion.  You have trouble speaking (aphasia) or understanding.  You have sudden trouble seeing in one or both eyes.  You have sudden trouble walking.  You have dizziness.  You have a loss of balance or coordination.  You have a sudden, severe headache with no known cause.  You have new chest pain or an irregular heartbeat. Any of these symptoms may represent a serious problem that is an emergency. Do not wait to see if the symptoms will go away.   Get medical help at once. Call your local emergency services (911 in [place] Do not drive yourself to the hospital. This information is not intended to replace advice given to you by your health care provider. Make sure you discuss any questions you have with your health care provider. Document Released: 05/03/2004 Document Revised: 09/01/2015 Document Reviewed: 09/26/2012 Elsevier  Interactive Patient Education  2017 Elsevier Inc.  

## 2016-09-28 DIAGNOSIS — L89512 Pressure ulcer of right ankle, stage 2: Secondary | ICD-10-CM | POA: Diagnosis not present

## 2016-09-28 DIAGNOSIS — Z681 Body mass index (BMI) 19 or less, adult: Secondary | ICD-10-CM | POA: Diagnosis not present

## 2016-10-02 NOTE — Addendum Note (Signed)
Addended by: Lianne Cure A on: 10/02/2016 03:50 PM   Modules accepted: Orders

## 2016-10-15 DIAGNOSIS — E782 Mixed hyperlipidemia: Secondary | ICD-10-CM | POA: Diagnosis not present

## 2016-10-15 DIAGNOSIS — I10 Essential (primary) hypertension: Secondary | ICD-10-CM | POA: Diagnosis not present

## 2016-10-17 DIAGNOSIS — G894 Chronic pain syndrome: Secondary | ICD-10-CM | POA: Diagnosis not present

## 2016-10-17 DIAGNOSIS — K219 Gastro-esophageal reflux disease without esophagitis: Secondary | ICD-10-CM | POA: Diagnosis not present

## 2016-10-17 DIAGNOSIS — I10 Essential (primary) hypertension: Secondary | ICD-10-CM | POA: Diagnosis not present

## 2016-10-17 DIAGNOSIS — R799 Abnormal finding of blood chemistry, unspecified: Secondary | ICD-10-CM | POA: Diagnosis not present

## 2016-10-17 DIAGNOSIS — Z681 Body mass index (BMI) 19 or less, adult: Secondary | ICD-10-CM | POA: Diagnosis not present

## 2016-10-17 DIAGNOSIS — Z8673 Personal history of transient ischemic attack (TIA), and cerebral infarction without residual deficits: Secondary | ICD-10-CM | POA: Diagnosis not present

## 2016-10-17 DIAGNOSIS — D594 Other nonautoimmune hemolytic anemias: Secondary | ICD-10-CM | POA: Diagnosis not present

## 2016-10-17 DIAGNOSIS — L89512 Pressure ulcer of right ankle, stage 2: Secondary | ICD-10-CM | POA: Diagnosis not present

## 2016-10-17 DIAGNOSIS — E782 Mixed hyperlipidemia: Secondary | ICD-10-CM | POA: Diagnosis not present

## 2016-10-17 DIAGNOSIS — I482 Chronic atrial fibrillation: Secondary | ICD-10-CM | POA: Diagnosis not present

## 2016-11-05 NOTE — Progress Notes (Signed)
Patient ID: [name], female   DOB: 1940/08/24, 76 y.o.   MRN: [id number]   [name] is seen for F/U of PAF, bruit, right subclavian steal and anticoagulatin She has known vascular disease primarily in the sublavian and carotids. She was admitted with syncope 2015 . She had PAF with diastolic heart failure.  EF was normal. She has seen [name] in F/U She has an occluded inominate on the right and had failed previous stenting attempts. She has 60-79% stenosis on left ICA. In combination with rapid atrial fibrillation this may have contributed to a brief loss of consdousness. Her diastolic CHF cleared with restoration of NSR. She was sent home on low dose amiodarone. She has had no SOB, or palpitations since being home. there have been no bleeding problems.   Reviewed duplex from [facility]  8/93/81   01-75%  LICA stenosis. Innominate right occlusion with steal  Reviewed labs from 04/12/16  and AST/ALT much better in  Normal range  LDL 103   Seen in ER 2/16 with cough  Given Augmentin but CT with mass in RLL ? necrotising pneumonia or cancer  Reviewed  Has lost about 15 lbs  Looks weak  Indicates breathing OK  Has cavitary lesion in lungs followed by pulmonary  IMPRESSION:  CT 09/08/11  Reviewed  1. Near complete collapse of the right lower lobe cavitary lesion with residual pleural-parenchymal thickening. 2. New fine ground-glass nodules in the right middle lobe consistent with pulmonary infection. 3. Stable pleural-parenchymal thickening in right upper lobe. 4. Stable small left lower lobe pulmonary nodules.   02/20/15  Hospitalized with GI Bleed  ASA stopped and coumadin changed to eliquis per Dr [name]  Acute GI bleed: Patient presented with hemoglobin of 6.2, INR above 10 at the time of admission. - Patient was admitted to stepdown unit, transfused 4 units packed RBCs and 5 units of FFP through hospitalization. - Patient was on aspirin, Plavix, warfarin prior to admission. - Gastroenterology was  consulted. Patient underwent endoscopy on 11/9 which showed normal esophagus, 5cm HH, gastritis w/ numerous clean based linear ulcers of stomach, and a few clean based ulcers in the duodenal bulb -Continue Protonix twice a day as recommended by gastroenterology, avoid NSAIDs. Meloxicam discontinued. -H pylori screen negative   Sees [name] pulmonary for moderate COPD.    ROS: Denies fever, malais, weight loss, blurry vision, decreased visual acuity, cough, sputum, SOB, hemoptysis, pleuritic pain, palpitaitons, heartburn, abdominal pain, melena, lower extremity edema, claudication, or rash.  All other systems reviewed and negative  General: BP 80/palp right 120/80 left  Affect appropriate Chronically ill white female  HEENT: normal Neck supple with no adenopathy JVP normal bilateral subclavian bruits no thyromegaly Lungs exp wheezing and good diaphragmatic motion Heart:  S1/S2 no murmur, no rub, gallop or click PMI normal Abdomen: benighn, BS positve, no tenderness, no AAA no bruit.  No HSM or HJR Distal pulses intact with no bruits No edema Neuro non-focal Skin warm and dry  Much bruising  No muscular weakness    Current Outpatient Prescriptions  Medication Sig Dispense Refill  . amiodarone (PACERONE) 200 MG tablet TAKE 1 TABLET BY MOUTH  DAILY 90 tablet 3  . amLODipine (NORVASC) 5 MG tablet Take 1 tablet (5 mg total) by mouth daily. 90 tablet 3  . apixaban (ELIQUIS) 5 MG TABS tablet Take 1 tablet (5 mg total) by mouth 2 (two) times daily. 180 tablet 3  . calcium-vitamin D (OSCAL) 250-125 MG-UNIT per tablet Take 1 tablet  by mouth 2 (two) times daily.      . clopidogrel (PLAVIX) 75 MG tablet TAKE 1 TABLET BY MOUTH  EVERY MORNING 90 tablet 3  . ferrous sulfate 325 (65 FE) MG tablet Take 325 mg by mouth daily with breakfast.    . irbesartan (AVAPRO) 150 MG tablet Take 1 tablet (150 mg total) by mouth every evening. (Patient taking differently: Take 150 mg by mouth 2 (two) times  daily. ) 90 tablet 2  . magnesium oxide (MAG-OX) 400 MG tablet Take 400 mg by mouth every morning.     . metoprolol tartrate (LOPRESSOR) 25 MG tablet TAKE 3 TABLETS BY MOUTH TWO TIMES DAILY 540 tablet 3  . Multiple Vitamin (MULTIVITAMIN WITH MINERALS) TABS tablet Take 1 tablet by mouth every morning.    . naloxegol oxalate (MOVANTIK) 25 MG TABS tablet Take 25 mg by mouth daily as needed (constipation).     Marland Kitchen oxyCODONE-acetaminophen (PERCOCET/ROXICET) 5-325 MG tablet Take 1-2 tablets by mouth daily as needed for pain.    . pantoprazole (PROTONIX) 40 MG tablet Take 40 mg by mouth daily.    . pravastatin (PRAVACHOL) 20 MG tablet TAKE 1 TABLET BY MOUTH  DAILY 90 tablet 3  . Tiotropium Bromide Monohydrate (SPIRIVA RESPIMAT) 2.5 MCG/ACT AERS Inhale 2 puffs into the lungs daily. 3 Inhaler 3  . vitamin C (ASCORBIC ACID) 500 MG tablet Take 500 mg by mouth every morning.      No current facility-administered medications for this visit.     Allergies  Iodine; Iohexol; and Tape  Electrocardiogram:  05/21/14  SR PR 276  LVH    Assessment and Plan Vascular: right innominate occlusion with steal moderate Left ICA disease.  F/u [facility] for duplex  PAF:  Continue eliquis lower to 2.5 bid if any further bleeding issues  Chol:  On statin.  Labs with primary  HTN:  On ARB well controlled  Pulmonary:  F/u CT in 3/17  Cavitary lung lesion not cancer This has been recurrent. 50 pack  Year history of smoking quit 10 years ago with Gold class 2 COPD f/u [name]  Edema:  Reviewed ABI's and venous duplex9/16 no DVT and circulation fine mild lymphedema Decreased norvasc to 5 mg  GI Bleed:  ASA stopped continue protonix had gastritis and duodenal ulcers.  Lab Results  Component Value Date   HCT 39.9 05/28/2016   CAD:  No chest pain extensive by CT last cath 2011 with no hemodynamically significant disease Continue plavix not on ASA due to GI bleed and need for eliquis for PAF   F/u with me in 6 months    [name]

## 2016-11-07 ENCOUNTER — Encounter: Payer: Self-pay | Admitting: Cardiovascular Disease

## 2016-11-07 ENCOUNTER — Ambulatory Visit (INDEPENDENT_AMBULATORY_CARE_PROVIDER_SITE_OTHER): Payer: Medicare Other | Admitting: Cardiovascular Disease

## 2016-11-07 ENCOUNTER — Encounter (INDEPENDENT_AMBULATORY_CARE_PROVIDER_SITE_OTHER): Payer: Self-pay

## 2016-11-07 VITALS — BP 140/80 | HR 62 | Ht 61.0 in | Wt 116.4 lb

## 2016-11-07 DIAGNOSIS — I480 Paroxysmal atrial fibrillation: Secondary | ICD-10-CM | POA: Diagnosis not present

## 2016-11-07 DIAGNOSIS — I6523 Occlusion and stenosis of bilateral carotid arteries: Secondary | ICD-10-CM | POA: Diagnosis not present

## 2016-11-07 NOTE — Patient Instructions (Signed)

## 2016-11-19 ENCOUNTER — Other Ambulatory Visit: Payer: Self-pay | Admitting: Cardiovascular Disease

## 2016-12-11 ENCOUNTER — Ambulatory Visit (INDEPENDENT_AMBULATORY_CARE_PROVIDER_SITE_OTHER): Payer: Medicare Other | Admitting: Internal Medicine

## 2017-01-15 DIAGNOSIS — Z23 Encounter for immunization: Secondary | ICD-10-CM | POA: Diagnosis not present

## 2017-02-15 ENCOUNTER — Other Ambulatory Visit: Payer: Self-pay | Admitting: Cardiovascular Disease

## 2017-03-08 ENCOUNTER — Other Ambulatory Visit: Payer: Self-pay | Admitting: Cardiovascular Disease

## 2017-04-18 DIAGNOSIS — E782 Mixed hyperlipidemia: Secondary | ICD-10-CM | POA: Diagnosis not present

## 2017-04-18 DIAGNOSIS — I10 Essential (primary) hypertension: Secondary | ICD-10-CM | POA: Diagnosis not present

## 2017-04-22 DIAGNOSIS — D594 Other nonautoimmune hemolytic anemias: Secondary | ICD-10-CM | POA: Diagnosis not present

## 2017-04-22 DIAGNOSIS — L89512 Pressure ulcer of right ankle, stage 2: Secondary | ICD-10-CM | POA: Diagnosis not present

## 2017-04-22 DIAGNOSIS — K219 Gastro-esophageal reflux disease without esophagitis: Secondary | ICD-10-CM | POA: Diagnosis not present

## 2017-04-22 DIAGNOSIS — Z8673 Personal history of transient ischemic attack (TIA), and cerebral infarction without residual deficits: Secondary | ICD-10-CM | POA: Diagnosis not present

## 2017-04-22 DIAGNOSIS — I10 Essential (primary) hypertension: Secondary | ICD-10-CM | POA: Diagnosis not present

## 2017-04-22 DIAGNOSIS — Z0001 Encounter for general adult medical examination with abnormal findings: Secondary | ICD-10-CM | POA: Diagnosis not present

## 2017-04-22 DIAGNOSIS — E785 Hyperlipidemia, unspecified: Secondary | ICD-10-CM | POA: Diagnosis not present

## 2017-04-22 DIAGNOSIS — R944 Abnormal results of kidney function studies: Secondary | ICD-10-CM | POA: Diagnosis not present

## 2017-04-22 DIAGNOSIS — G894 Chronic pain syndrome: Secondary | ICD-10-CM | POA: Diagnosis not present

## 2017-04-22 DIAGNOSIS — D72829 Elevated white blood cell count, unspecified: Secondary | ICD-10-CM | POA: Diagnosis not present

## 2017-04-22 DIAGNOSIS — I482 Chronic atrial fibrillation: Secondary | ICD-10-CM | POA: Diagnosis not present

## 2017-05-10 ENCOUNTER — Telehealth: Payer: Self-pay | Admitting: Orthopaedic Surgery

## 2017-05-10 DIAGNOSIS — M816 Localized osteoporosis [Lequesne]: Secondary | ICD-10-CM | POA: Diagnosis not present

## 2017-05-10 DIAGNOSIS — G894 Chronic pain syndrome: Secondary | ICD-10-CM | POA: Diagnosis not present

## 2017-05-10 DIAGNOSIS — D594 Other nonautoimmune hemolytic anemias: Secondary | ICD-10-CM | POA: Diagnosis not present

## 2017-05-10 DIAGNOSIS — Z0001 Encounter for general adult medical examination with abnormal findings: Secondary | ICD-10-CM | POA: Diagnosis not present

## 2017-05-10 DIAGNOSIS — L89512 Pressure ulcer of right ankle, stage 2: Secondary | ICD-10-CM | POA: Diagnosis not present

## 2017-05-10 DIAGNOSIS — E782 Mixed hyperlipidemia: Secondary | ICD-10-CM | POA: Diagnosis not present

## 2017-05-10 DIAGNOSIS — K219 Gastro-esophageal reflux disease without esophagitis: Secondary | ICD-10-CM | POA: Diagnosis not present

## 2017-05-10 DIAGNOSIS — I10 Essential (primary) hypertension: Secondary | ICD-10-CM | POA: Diagnosis not present

## 2017-05-10 DIAGNOSIS — M25562 Pain in left knee: Secondary | ICD-10-CM | POA: Diagnosis not present

## 2017-05-10 DIAGNOSIS — R799 Abnormal finding of blood chemistry, unspecified: Secondary | ICD-10-CM | POA: Diagnosis not present

## 2017-05-10 DIAGNOSIS — G589 Mononeuropathy, unspecified: Secondary | ICD-10-CM | POA: Diagnosis not present

## 2017-05-10 DIAGNOSIS — Z8673 Personal history of transient ischemic attack (TIA), and cerebral infarction without residual deficits: Secondary | ICD-10-CM | POA: Diagnosis not present

## 2017-05-10 NOTE — Telephone Encounter (Signed)
Patient called this morning, Friday, 05/10/17, requesting to see Dr Luna Glasgow today for left knee problem - states she had a bit of a fall earlier in the week, and is now concerned.  Relayed we can schedule when Dr Luna Glasgow returns to clinic, Tuesday, 05/14/17.  Options discussed: primary care doctor, Emergency room, or urgent care.  Michela Pitcher will try primary care doctor, and opted to schedule for Tuesday as discussed.

## 2017-05-14 ENCOUNTER — Encounter: Payer: Self-pay | Admitting: Orthopaedic Surgery

## 2017-05-14 ENCOUNTER — Ambulatory Visit (INDEPENDENT_AMBULATORY_CARE_PROVIDER_SITE_OTHER): Payer: Medicare Other | Admitting: Orthopaedic Surgery

## 2017-05-14 ENCOUNTER — Ambulatory Visit (INDEPENDENT_AMBULATORY_CARE_PROVIDER_SITE_OTHER): Payer: Medicare Other

## 2017-05-14 VITALS — BP 140/84 | HR 65 | Temp 97.6°F | Ht 62.0 in | Wt 110.0 lb

## 2017-05-14 DIAGNOSIS — M25562 Pain in left knee: Secondary | ICD-10-CM

## 2017-05-14 NOTE — Progress Notes (Signed)
dg 

## 2017-05-14 NOTE — Progress Notes (Signed)
Patient [id number] [name], female DOB:25-Mar-1941, 77 y.o. GLO:[id number]  Chief Complaint  Patient presents with  . Knee Pain    left    HPI  [name] is a 77 y.o. female who fell about 10 days to two weeks ago and hurt her left knee.  She got better at first.  She was seen at Dr. Earnest Conroy. [place] office and told to use ice.  She did well but then has gotten worse.  She has difficulty standing.  She has no other injury.  She is on anti-coagulants and cannot take any NSAID.  She has minimal swelling, no redness, no giving way.  Past Medical History:  Diagnosis Date  . Anemia 05/2014.  Marland Kitchen Atrial fibrillation ([place])    x 3 yrs  . Breast cancer ([place])    S/P left mastectomy and chemotherapy 1989 remained in remission  . Carotid artery occlusion   . Carotid bruit    LICA 18-84% (duplex 1/66)  . Degenerative arthritis of spine 2015   Chronic back pain  . Hyperlipidemia   . Hypertension   . Meningioma ([place])   . Osteoporosis   . Pneumonia May 19, 2014  . Stroke [facility])    CVA 2008  . Subclavian steal syndrome   . Ulcers of both lower extremities ([place]) 2015    Past Surgical History:  Procedure Laterality Date  . CARDIAC CATHETERIZATION    . CATARACT EXTRACTION Bilateral 2013  . COLONOSCOPY  11/17/2010   Procedure: COLONOSCOPY;  Surgeon: [name], MD;  [facility];  Service: Endoscopy;  Laterality: N/A;  10:45 am  . ESOPHAGOGASTRODUODENOSCOPY  11/17/2010   Procedure: ESOPHAGOGASTRODUODENOSCOPY (EGD);  Surgeon: [name], MD;  [facility];  Service: Endoscopy;  Laterality: N/A;  . ESOPHAGOGASTRODUODENOSCOPY N/A 02/16/2015   Procedure: ESOPHAGOGASTRODUODENOSCOPY (EGD);  Surgeon: [name], MD;  Location: [place];  Service: Gastroenterology;  Laterality: N/A;  . EXPLORATORY LAPAROTOMY  1960s   For peritonitis of undetermined cause  . EYE SURGERY    . LUMBAR EPIDURAL INJECTION  06-2012--06-2013   pt. states she has had 5 epidurals in  06-2012----06-2013  . MASTECTOMY Left 1990  . YAG LASER APPLICATION Right 0/63/0160   Procedure: YAG LASER APPLICATION;  Surgeon: [name], MD;  [facility];  Service: Ophthalmology;  Laterality: Right;  right  . YAG LASER APPLICATION Left 04/17/3233   Procedure: YAG LASER APPLICATION;  Surgeon: [name], MD;  Location: [facility];  Service: Ophthalmology;  Laterality: Left;    Current Outpatient Medications on File Prior to Visit  Medication Sig Dispense Refill  . amiodarone (PACERONE) 200 MG tablet TAKE 1 TABLET BY MOUTH  DAILY 90 tablet 3  . amLODipine (NORVASC) 5 MG tablet TAKE 1 TABLET BY MOUTH  DAILY 90 tablet 2  . calcium-vitamin D (OSCAL) 250-125 MG-UNIT per tablet Take 1 tablet by mouth 2 (two) times daily.      . clopidogrel (PLAVIX) 75 MG tablet TAKE 1 TABLET BY MOUTH  EVERY MORNING 90 tablet 3  . ELIQUIS 5 MG TABS tablet TAKE 1 TABLET BY MOUTH TWO  TIMES DAILY 180 tablet 3  . ferrous sulfate 325 (65 FE) MG tablet Take 325 mg by mouth daily with breakfast.    . HYDROcodone-acetaminophen (NORCO/VICODIN) 5-325 MG tablet Take 1 tablet by mouth every 6 (six) hours as needed for moderate pain.    Marland Kitchen irbesartan (AVAPRO) 150 MG tablet TAKE 1 TABLET BY MOUTH  EVERY EVENING 90 tablet 1  .  magnesium oxide (MAG-OX) 400 MG tablet Take 400 mg by mouth every morning.     . metoprolol tartrate (LOPRESSOR) 25 MG tablet TAKE 3 TABLETS BY MOUTH TWO TIMES DAILY 540 tablet 3  . Multiple Vitamin (MULTIVITAMIN WITH MINERALS) TABS tablet Take 1 tablet by mouth every morning.    . naloxegol oxalate (MOVANTIK) 25 MG TABS tablet Take 25 mg by mouth daily as needed (constipation).     . pantoprazole (PROTONIX) 40 MG tablet Take 40 mg by mouth daily.    . pravastatin (PRAVACHOL) 20 MG tablet TAKE 1 TABLET BY MOUTH  DAILY 90 tablet 2  . Tiotropium Bromide Monohydrate (SPIRIVA RESPIMAT) 2.5 MCG/ACT AERS Inhale 2 puffs into the lungs daily. 3 Inhaler 3  . vitamin C (ASCORBIC ACID) 500 MG tablet Take 500  mg by mouth every morning.     Marland Kitchen oxyCODONE-acetaminophen (PERCOCET/ROXICET) 5-325 MG tablet Take 1-2 tablets by mouth daily as needed for pain.     No current facility-administered medications on file prior to visit.     Social History   Socioeconomic History  . Marital status: Married    Spouse name: Not on file  . Number of children: 2  . Years of education: Not on file  . Highest education level: Not on file  Social Needs  . Financial resource strain: Not on file  . Food insecurity - worry: Not on file  . Food insecurity - inability: Not on file  . Transportation needs - medical: Not on file  . Transportation needs - non-medical: Not on file  Occupational History  . Occupation: Retired    Fish farm manager: RETIRED  Tobacco Use  . Smoking status: Former Smoker    Packs/day: 1.00    Years: 56.00    Pack years: 56.00    Types: Cigarettes    Last attempt to quit: 08/14/2006    Years since quitting: 10.7  . Smokeless tobacco: Never Used  . Tobacco comment: quit in 2008  Substance and Sexual Activity  . Alcohol use: No    Alcohol/week: 0.0 oz  . Drug use: No  . Sexual activity: No  Other Topics Concern  . Not on file  Social History Narrative  . Not on file    Family History  Problem Relation Age of Onset  . Alzheimer's disease Mother   . Dementia Mother   . Hypertension Mother   . Hyperlipidemia Mother   . Parkinsonism Father     BP 140/84   Pulse 65   Temp 97.6 F (36.4 C)   Ht 5\' 2"  (1.575 m)   Wt 110 lb (49.9 kg)   BMI 20.12 kg/m   HPI  Body mass index is 20.12 kg/m.  ROS  Review of Systems  Respiratory: Positive for shortness of breath.   Cardiovascular: Positive for palpitations.  Musculoskeletal: Positive for arthralgias, back pain, gait problem and joint swelling.  All other systems reviewed and are negative.   Past Medical History:  Diagnosis Date  . Anemia 05/2014.  Marland Kitchen Atrial fibrillation ([place])    x 3 yrs  . Breast cancer ([place])    S/P left  mastectomy and chemotherapy 1989 remained in remission  . Carotid artery occlusion   . Carotid bruit    LICA 62-83% (duplex 1/51)  . Degenerative arthritis of spine 2015   Chronic back pain  . Hyperlipidemia   . Hypertension   . Meningioma ([place])   . Osteoporosis   . Pneumonia May 19, 2014  . Stroke [facility])  CVA 2008  . Subclavian steal syndrome   . Ulcers of both lower extremities ([place]) 2015    Past Surgical History:  Procedure Laterality Date  . CARDIAC CATHETERIZATION    . CATARACT EXTRACTION Bilateral 2013  . COLONOSCOPY  11/17/2010   Procedure: COLONOSCOPY;  Surgeon: [name], MD;  [facility];  Service: Endoscopy;  Laterality: N/A;  10:45 am  . ESOPHAGOGASTRODUODENOSCOPY  11/17/2010   Procedure: ESOPHAGOGASTRODUODENOSCOPY (EGD);  Surgeon: [name], MD;  [facility];  Service: Endoscopy;  Laterality: N/A;  . ESOPHAGOGASTRODUODENOSCOPY N/A 02/16/2015   Procedure: ESOPHAGOGASTRODUODENOSCOPY (EGD);  Surgeon: [name], MD;  Location: [place];  Service: Gastroenterology;  Laterality: N/A;  . EXPLORATORY LAPAROTOMY  1960s   For peritonitis of undetermined cause  . EYE SURGERY    . LUMBAR EPIDURAL INJECTION  06-2012--06-2013   pt. states she has had 5 epidurals in 06-2012----06-2013  . MASTECTOMY Left 1990  . YAG LASER APPLICATION Right 3/66/4403   Procedure: YAG LASER APPLICATION;  Surgeon: [name], MD;  [facility];  Service: Ophthalmology;  Laterality: Right;  right  . YAG LASER APPLICATION Left 4/74/2595   Procedure: YAG LASER APPLICATION;  Surgeon: [name], MD;  Location[facility];  Service: Ophthalmology;  Laterality: Left;    Family History  Problem Relation Age of Onset  . Alzheimer's disease Mother   . Dementia Mother   . Hypertension Mother   . Hyperlipidemia Mother   . Parkinsonism Father     Social History Social History   Tobacco Use  . Smoking status: Former Smoker    Packs/day:  1.00    Years: 56.00    Pack years: 56.00    Types: Cigarettes    Last attempt to quit: 08/14/2006    Years since quitting: 10.7  . Smokeless tobacco: Never Used  . Tobacco comment: quit in 2008  Substance Use Topics  . Alcohol use: No    Alcohol/week: 0.0 oz  . Drug use: No    Allergies  Allergen Reactions  . Iodine Rash and Other (See Comments)    BETADINE Rash/burning, blisters on skin.  . Iohexol Rash and Other (See Comments)    Blisters; PT NEEDS 13-HOUR PREP   . Tape Itching and Rash    Prefers PAPER TAPE    Current Outpatient Medications  Medication Sig Dispense Refill  . amiodarone (PACERONE) 200 MG tablet TAKE 1 TABLET BY MOUTH  DAILY 90 tablet 3  . amLODipine (NORVASC) 5 MG tablet TAKE 1 TABLET BY MOUTH  DAILY 90 tablet 2  . calcium-vitamin D (OSCAL) 250-125 MG-UNIT per tablet Take 1 tablet by mouth 2 (two) times daily.      . clopidogrel (PLAVIX) 75 MG tablet TAKE 1 TABLET BY MOUTH  EVERY MORNING 90 tablet 3  . ELIQUIS 5 MG TABS tablet TAKE 1 TABLET BY MOUTH TWO  TIMES DAILY 180 tablet 3  . ferrous sulfate 325 (65 FE) MG tablet Take 325 mg by mouth daily with breakfast.    . HYDROcodone-acetaminophen (NORCO/VICODIN) 5-325 MG tablet Take 1 tablet by mouth every 6 (six) hours as needed for moderate pain.    Marland Kitchen irbesartan (AVAPRO) 150 MG tablet TAKE 1 TABLET BY MOUTH  EVERY EVENING 90 tablet 1  . magnesium oxide (MAG-OX) 400 MG tablet Take 400 mg by mouth every morning.     . metoprolol tartrate (LOPRESSOR) 25 MG tablet TAKE 3 TABLETS BY MOUTH TWO TIMES DAILY 540 tablet 3  .  Multiple Vitamin (MULTIVITAMIN WITH MINERALS) TABS tablet Take 1 tablet by mouth every morning.    . naloxegol oxalate (MOVANTIK) 25 MG TABS tablet Take 25 mg by mouth daily as needed (constipation).     . pantoprazole (PROTONIX) 40 MG tablet Take 40 mg by mouth daily.    . pravastatin (PRAVACHOL) 20 MG tablet TAKE 1 TABLET BY MOUTH  DAILY 90 tablet 2  . Tiotropium Bromide Monohydrate (SPIRIVA  RESPIMAT) 2.5 MCG/ACT AERS Inhale 2 puffs into the lungs daily. 3 Inhaler 3  . vitamin C (ASCORBIC ACID) 500 MG tablet Take 500 mg by mouth every morning.     Marland Kitchen oxyCODONE-acetaminophen (PERCOCET/ROXICET) 5-325 MG tablet Take 1-2 tablets by mouth daily as needed for pain.     No current facility-administered medications for this visit.      Physical Exam  Blood pressure 140/84, pulse 65, temperature 97.6 F (36.4 C), height 5\' 2"  (1.575 m), weight 110 lb (49.9 kg).  Constitutional: overall normal hygiene, normal nutrition, well developed, normal grooming, normal body habitus. Assistive device:wheelchair  Musculoskeletal: gait and station Limp left, muscle tone and strength are normal, no tremors or atrophy is present.  .  Neurological: coordination overall normal.  Deep tendon reflex/nerve stretch intact.  Sensation normal.  Cranial nerves II-XII intact.   Skin:   Normal overall no scars, lesions, ulcers or rashes. No psoriasis.  Psychiatric: Alert and oriented x 3.  Recent memory intact, remote memory unclear.  Normal mood and affect. Well groomed.  Good eye contact.  Cardiovascular: overall no swelling, no varicosities, no edema bilaterally, normal temperatures of the legs and arms, no clubbing, cyanosis and good capillary refill.  Lymphatic: palpation is normal.  Left knee with ROM 0 to 110, tender medially, crepitus, slight effusion, stable, NV intact.  All other systems reviewed and are negative   The patient has been educated about the nature of the problem(s) and counseled on treatment options.  The patient appeared to understand what I have discussed and is in agreement with it.  X-rays were done of the left knee, reported separately.  Encounter Diagnosis  Name Primary?  . Acute pain of left knee Yes   PROCEDURE NOTE:  The patient requests injections of the left knee , verbal consent was obtained.  The left knee was prepped appropriately after time out was  performed.   Sterile technique was observed and injection of 1 cc of Depo-Medrol 40 mg with several cc's of plain xylocaine. Anesthesia was provided by ethyl chloride and a 20-gauge needle was used to inject the knee area. The injection was tolerated well.  A band aid dressing was applied.  The patient was advised to apply ice later today and tomorrow to the injection sight as needed.   PLAN Call if any problems.  Precautions discussed.  Continue current medications.   Return to clinic 2 weeks   Electronically Signed Sanjuana Kava, MD 2/5/20193:19 PM

## 2017-05-17 ENCOUNTER — Other Ambulatory Visit: Payer: Self-pay | Admitting: Cardiovascular Disease

## 2017-05-21 ENCOUNTER — Ambulatory Visit (INDEPENDENT_AMBULATORY_CARE_PROVIDER_SITE_OTHER): Payer: Medicare Other | Admitting: Orthopaedic Surgery

## 2017-05-21 ENCOUNTER — Encounter: Payer: Self-pay | Admitting: Orthopaedic Surgery

## 2017-05-21 VITALS — BP 149/92 | HR 56 | Temp 96.1°F | Ht 62.0 in | Wt 110.0 lb

## 2017-05-21 DIAGNOSIS — L89512 Pressure ulcer of right ankle, stage 2: Secondary | ICD-10-CM | POA: Diagnosis not present

## 2017-05-21 DIAGNOSIS — M25562 Pain in left knee: Secondary | ICD-10-CM

## 2017-05-21 DIAGNOSIS — G8929 Other chronic pain: Secondary | ICD-10-CM

## 2017-05-21 NOTE — Progress Notes (Signed)
[name], female DOB:03/16/1941, 77 y.o. PYP:[id number]  Chief Complaint  Colleen presents with  . Knee Pain    left   HPI  [name] is a 77 y.o. female who has worsening pain of the left knee.  The injection did not help.  She has more instability and giving way, more pain.  She has no new trauma. She has less swelling.   I will get a MRI of the knee on the left. HPI  Body mass index is 20.12 kg/m.  ROS  Review of Systems  Respiratory: Positive for shortness of breath.   Cardiovascular: Positive for palpitations.  Musculoskeletal: Positive for arthralgias, back pain, gait problem and joint swelling.  All other systems reviewed and are negative.   Past Medical History:  Diagnosis Date  . Anemia 05/2014.  Marland Kitchen Atrial fibrillation ([place])    x 3 yrs  . Breast cancer ([place])    S/P left mastectomy and chemotherapy 1989 remained in remission  . Carotid artery occlusion   . Carotid bruit    LICA 24-58% (duplex 0/99)  . Degenerative arthritis of spine 2015   Chronic back pain  . Hyperlipidemia   . Hypertension   . Meningioma ([place])   . Osteoporosis   . Pneumonia May 19, 2014  . Stroke [facility])    CVA 2008  . Subclavian steal syndrome   . Ulcers of both lower extremities ([place]) 2015    Past Surgical History:  Procedure Laterality Date  . CARDIAC CATHETERIZATION    . CATARACT EXTRACTION Bilateral 2013  . COLONOSCOPY  11/17/2010   Procedure: COLONOSCOPY;  Surgeon: [name], MD;  [facility];  Service: Endoscopy;  Laterality: N/A;  10:45 am  . ESOPHAGOGASTRODUODENOSCOPY  11/17/2010   Procedure: ESOPHAGOGASTRODUODENOSCOPY (EGD);  Surgeon: [name], MD;  [facility];  Service: Endoscopy;  Laterality: N/A;  . ESOPHAGOGASTRODUODENOSCOPY N/A 02/16/2015   Procedure: ESOPHAGOGASTRODUODENOSCOPY (EGD);  Surgeon: [name], MD;  Location: [place];  Service: Gastroenterology;  Laterality: N/A;  . EXPLORATORY LAPAROTOMY   1960s   For peritonitis of undetermined cause  . EYE SURGERY    . LUMBAR EPIDURAL INJECTION  06-2012--06-2013   pt. states she has had 5 epidurals in 06-2012----06-2013  . MASTECTOMY Left 1990  . YAG LASER APPLICATION Right 8/33/8250   Procedure: YAG LASER APPLICATION;  Surgeon: [name], MD;  [facility];  Service: Ophthalmology;  Laterality: Right;  right  . YAG LASER APPLICATION Left 5/39/7673   Procedure: YAG LASER APPLICATION;  Surgeon: [name], MD;  [facility];  Service: Ophthalmology;  Laterality: Left;    Family History  Problem Relation Age of Onset  . Alzheimer's disease Mother   . Dementia Mother   . Hypertension Mother   . Hyperlipidemia Mother   . Parkinsonism Father     Social History Social History   Tobacco Use  . Smoking status: Former Smoker    Packs/day: 1.00    Years: 56.00    Pack years: 56.00    Types: Cigarettes    Last attempt to quit: 08/14/2006    Years since quitting: 10.7  . Smokeless tobacco: Never Used  . Tobacco comment: quit in 2008  Substance Use Topics  . Alcohol use: No    Alcohol/week: 0.0 oz  . Drug use: No    Allergies  Allergen Reactions  . Iodine Rash and Other (See Comments)    BETADINE Rash/burning, blisters on skin.  . Iohexol  Rash and Other (See Comments)    Blisters; PT NEEDS 13-HOUR PREP   . Tape Itching and Rash    Prefers PAPER TAPE    Current Outpatient Medications  Medication Sig Dispense Refill  . amiodarone (PACERONE) 200 MG tablet TAKE 1 TABLET BY MOUTH  DAILY 90 tablet 3  . amLODipine (NORVASC) 5 MG tablet TAKE 1 TABLET BY MOUTH  DAILY 90 tablet 2  . calcium-vitamin D (OSCAL) 250-125 MG-UNIT per tablet Take 1 tablet by mouth 2 (two) times daily.      . clopidogrel (PLAVIX) 75 MG tablet TAKE 1 TABLET BY MOUTH  EVERY MORNING 90 tablet 3  . ELIQUIS 5 MG TABS tablet TAKE 1 TABLET BY MOUTH TWO  TIMES DAILY 180 tablet 3  . ferrous sulfate 325 (65 FE) MG tablet Take 325 mg by mouth daily with  breakfast.    . HYDROcodone-acetaminophen (NORCO/VICODIN) 5-325 MG tablet Take 1 tablet by mouth every 6 (six) hours as needed for moderate pain.    Marland Kitchen irbesartan (AVAPRO) 150 MG tablet TAKE 1 TABLET BY MOUTH  EVERY EVENING 90 tablet 1  . magnesium oxide (MAG-OX) 400 MG tablet Take 400 mg by mouth every morning.     . metoprolol tartrate (LOPRESSOR) 25 MG tablet TAKE 3 TABLETS BY MOUTH TWO TIMES DAILY 540 tablet 3  . Multiple Vitamin (MULTIVITAMIN WITH MINERALS) TABS tablet Take 1 tablet by mouth every morning.    . naloxegol oxalate (MOVANTIK) 25 MG TABS tablet Take 25 mg by mouth daily as needed (constipation).     Marland Kitchen oxyCODONE-acetaminophen (PERCOCET/ROXICET) 5-325 MG tablet Take 1-2 tablets by mouth daily as needed for pain.    . pantoprazole (PROTONIX) 40 MG tablet Take 40 mg by mouth daily.    . pravastatin (PRAVACHOL) 20 MG tablet TAKE 1 TABLET BY MOUTH  DAILY 90 tablet 2  . Tiotropium Bromide Monohydrate (SPIRIVA RESPIMAT) 2.5 MCG/ACT AERS Inhale 2 puffs into the lungs daily. 3 Inhaler 3  . vitamin C (ASCORBIC ACID) 500 MG tablet Take 500 mg by mouth every morning.      No current facility-administered medications for this visit.      Physical Exam  Blood pressure (!) 149/92, pulse (!) 56, temperature (!) 96.1 F (35.6 C), height 5\' 2"  (1.575 m), weight 110 lb (49.9 kg).  Constitutional: overall normal hygiene, normal nutrition, well developed, normal grooming, normal body habitus. Assistive device:wheelchair  Musculoskeletal: gait and station Limp left, muscle tone and strength are normal, no tremors or atrophy is present.  .  Neurological: coordination overall normal.  Deep tendon reflex/nerve stretch intact.  Sensation normal.  Cranial nerves II-XII intact.   Skin:   Normal overall no scars, lesions, ulcers or rashes. No psoriasis.  Psychiatric: Alert and oriented x 3.  Recent memory intact, remote memory unclear.  Normal mood and affect. Well groomed.  Good eye  contact.  Cardiovascular: overall no swelling, no varicosities, no edema bilaterally, normal temperatures of the legs and arms, no clubbing, cyanosis and good capillary refill.  Lymphatic: palpation is normal.  The left lower extremity is examined:  Inspection:  Thigh:  Non-tender and no defects  Knee has swelling 1/2+ effusion.                        Joint tenderness is present                        Colleen is tender over the  medial joint line  Lower Leg:  Has normal appearance and no tenderness or defects  Ankle:  Non-tender and no defects  Foot:  Non-tender and no defects Range of Motion:  Knee:  Range of motion is: 0-105                        Crepitus is  present  Ankle:  Range of motion is normal. Strength and Tone:  The left lower extremity has normal strength and tone. Stability:  Knee:  The knee has positive medial McMurray.  Ankle:  The ankle is stable.   All other systems reviewed and are negative   The Colleen has been educated about the nature of the problem(s) and counseled on treatment options.  The Colleen appeared to understand what I have discussed and is in agreement with it.  Encounter Diagnosis  Name Primary?  . Chronic pain of left knee Yes    PLAN Call if any problems.  Precautions discussed.  Continue current medications.   Return to clinic aft MRI of the left knee.  I am concerned about a medial meniscus tear.   Electronically Signed Sanjuana Kava, MD 2/12/20198:55 AM

## 2017-05-21 NOTE — Patient Instructions (Signed)
Your MRI has been ordered.  We will contact your insurance company for approval. [facility] [place], [place].  Their scheduling number is [phone].  They will call you to schedule the appointment after the study has been given an authorization number.  If you have not been given an appointment within within 5 business days please call [phone] and ask for the pre-authorization representative in our office.

## 2017-05-22 DIAGNOSIS — S82115A Nondisplaced fracture of left tibial spine, initial encounter for closed fracture: Secondary | ICD-10-CM | POA: Diagnosis not present

## 2017-05-22 DIAGNOSIS — M76892 Other specified enthesopathies of left lower limb, excluding foot: Secondary | ICD-10-CM | POA: Diagnosis not present

## 2017-05-22 DIAGNOSIS — S89092A Other physeal fracture of upper end of left tibia, initial encounter for closed fracture: Secondary | ICD-10-CM | POA: Diagnosis not present

## 2017-05-22 DIAGNOSIS — M25462 Effusion, left knee: Secondary | ICD-10-CM | POA: Diagnosis not present

## 2017-05-22 DIAGNOSIS — M6752 Plica syndrome, left knee: Secondary | ICD-10-CM | POA: Diagnosis not present

## 2017-05-23 ENCOUNTER — Telehealth: Payer: Self-pay | Admitting: Radiology

## 2017-05-23 NOTE — Telephone Encounter (Signed)
Dr. Luna Glasgow spoke with the patient and gave her the MRI results.  He explained the need for her to stay off her leg and come in 05/29/17 for her appointment.  We will x-ray her leg again at that time.

## 2017-05-27 NOTE — Progress Notes (Signed)
Patient ID: [name], female   DOB: 1940-08-17, 77 y.o.   MRN: [id number]   77 y.o. 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 LICA 35-57% duplex 06/28/00 GI bleed 02/20/15 transfused 4 units INR over 10 coumadin changed to eliquis. Had gastric and duodenal ulcers. Chronic lung disease with necrotizing pneumonia moderate COPD sees Ramaschawmy from pulmonary   Labs 04/18/17 Hct 39.4 PLT 321 Cr 1.28 LDL 109 normal LFTls   She seems depressed about her inability to do things and wants to limit diagnostic testing   ROS: Denies fever, malais, weight loss, blurry vision, decreased visual acuity, cough, sputum, SOB, hemoptysis, pleuritic pain, palpitaitons, heartburn, abdominal pain, melena, lower extremity edema, claudication, or rash.  All other systems reviewed and negative  General: BP (!) 154/94   Pulse (!) 103   Ht 5\' 2"  (1.575 m)   Wt 115 lb (52.2 kg)   SpO2 97%   BMI 21.03 kg/m  Affect appropriate Frail thin chronically ill white female  HEENT: normal Neck supple with no adenopathy JVP normal bilateral carotid and subclavian  bruits no thyromegaly Lungs clear with no wheezing and good diaphragmatic motion Heart:  S1/S2 no murmur, no rub, gallop or click PMI normal Abdomen: benighn, BS positve, no tenderness, no AAA no bruit.  No HSM or HJR Distal pulses intact with no bruits No edema Neuro non-focal Skin bruising over arms  No muscular weakness     Current Outpatient Medications  Medication Sig Dispense Refill  . amiodarone (PACERONE) 200 MG tablet TAKE 1 TABLET BY MOUTH  DAILY 90 tablet 1  . amLODipine (NORVASC) 5 MG tablet TAKE 1 TABLET BY MOUTH  DAILY 90 tablet 2  . calcium-vitamin D (OSCAL) 250-125 MG-UNIT per tablet Take 1 tablet by mouth 2 (two) times daily.      . clopidogrel (PLAVIX) 75 MG tablet TAKE 1 TABLET BY MOUTH  EVERY MORNING 90 tablet 1  . ELIQUIS 5 MG TABS tablet TAKE 1 TABLET BY MOUTH  TWO  TIMES DAILY 180 tablet 3  . ferrous sulfate 325 (65 FE) MG tablet Take 325 mg by mouth daily with breakfast.    . HYDROcodone-acetaminophen (NORCO/VICODIN) 5-325 MG tablet Take 1 tablet by mouth every 6 (six) hours as needed for moderate pain.    Marland Kitchen irbesartan (AVAPRO) 150 MG tablet TAKE 1 TABLET BY MOUTH  EVERY EVENING 90 tablet 1  . magnesium oxide (MAG-OX) 400 MG tablet Take 400 mg by mouth every morning.     . metoprolol tartrate (LOPRESSOR) 25 MG tablet TAKE 3 TABLETS BY MOUTH TWO TIMES DAILY 540 tablet 1  . Multiple Vitamin (MULTIVITAMIN WITH MINERALS) TABS tablet Take 1 tablet by mouth every morning.    . naloxegol oxalate (MOVANTIK) 25 MG TABS tablet Take 25 mg by mouth daily as needed (constipation).     Marland Kitchen oxyCODONE-acetaminophen (PERCOCET/ROXICET) 5-325 MG tablet Take 1-2 tablets by mouth daily as needed for pain.    . pantoprazole (PROTONIX) 40 MG tablet Take 40 mg by mouth daily.    . pravastatin (PRAVACHOL) 20 MG tablet TAKE 1 TABLET BY MOUTH  DAILY 90 tablet 2  . Tiotropium Bromide Monohydrate (SPIRIVA RESPIMAT) 2.5 MCG/ACT AERS Inhale 2 puffs into the lungs daily. 3 Inhaler 3  . vitamin C (ASCORBIC ACID) 500 MG tablet Take 500 mg by mouth every morning.      No current facility-administered medications for this visit.     Allergies  Iodine; Iohexol;  and Tape  Electrocardiogram:  05/21/14  SR PR 276  LVH  06/03/17  SR rate 57 PR 308 msec   Assessment and Plan Vascular: right innominate occlusion with moderate LICA stenosis f/u duplex June 2019 at [facility] PAF:  Continue eliquis lower to 2.5 bid if any further bleeding issues  Chol:  On statin.  Labs with primary  HTN:  Continue ARB controlled with right arm BP lower than left due to PVD Pulmonary:  Quit smoking 11 years ago Gold Class 2 COPD f/u pulmonary  Edema:  No DVT by duplex norvasc decreased stable Anemia :  History of gastric ulcers continue protonix f/u GI  Lab Results  Component Value Date   HCT 39.9 05/28/2016    CAD:  No chest pain extensive by CT last cath 2011 with no hemodynamically significant disease Continue plavix not on ASA due to GI bleed and need for eliquis for PAF   F/u with me in 6 months   Jenkins Rouge

## 2017-05-28 NOTE — Telephone Encounter (Signed)
Dr. Luna Glasgow spoke with the patient and explained the MRI findings.

## 2017-05-29 ENCOUNTER — Ambulatory Visit (INDEPENDENT_AMBULATORY_CARE_PROVIDER_SITE_OTHER): Payer: Medicare Other

## 2017-05-29 ENCOUNTER — Encounter: Payer: Self-pay | Admitting: Orthopaedic Surgery

## 2017-05-29 ENCOUNTER — Other Ambulatory Visit: Payer: Self-pay | Admitting: Cardiovascular Disease

## 2017-05-29 ENCOUNTER — Ambulatory Visit (INDEPENDENT_AMBULATORY_CARE_PROVIDER_SITE_OTHER): Payer: Medicare Other | Admitting: Orthopaedic Surgery

## 2017-05-29 VITALS — BP 134/78 | HR 58 | Temp 97.3°F

## 2017-05-29 DIAGNOSIS — M25562 Pain in left knee: Secondary | ICD-10-CM | POA: Diagnosis not present

## 2017-05-29 DIAGNOSIS — S82142A Displaced bicondylar fracture of left tibia, initial encounter for closed fracture: Secondary | ICD-10-CM | POA: Diagnosis not present

## 2017-05-29 NOTE — Progress Notes (Signed)
Patient [name], female DOB:[birth year], 77 y.o. HBZ:[id number]  Chief Complaint  Patient presents with  . Knee Pain    left    HPI  [name] is a 77 y.o. female who has had knee pain on the left.  She had a MRI which showed a fracture nondisplaced nondepressed of the left tibial plateau medially.  She was called and told to stay off of the knee.  She has done this and is feeling much better.  She has no effusion now and very little pain. HPI  There is no height or weight on file to calculate BMI.  ROS  Review of Systems  Respiratory: Positive for shortness of breath.   Cardiovascular: Positive for palpitations.  Musculoskeletal: Positive for arthralgias, back pain, gait problem and joint swelling.  All other systems reviewed and are negative.   Past Medical History:  Diagnosis Date  . Anemia 05/2014.  [name] Atrial fibrillation ([place])    x 3 yrs  . Breast cancer ([place])    S/P left mastectomy and chemotherapy 1989 remained in remission  . Carotid artery occlusion   . Carotid bruit    LICA 10-17% (duplex 5/10)  . Degenerative arthritis of spine 2015   Chronic back pain  . Hyperlipidemia   . Hypertension   . Meningioma ([place])   . Osteoporosis   . Pneumonia May 19, 2014  . Stroke [facility])    CVA 2008  . Subclavian steal syndrome   . Ulcers of both lower extremities ([place]) 2015    Past Surgical History:  Procedure Laterality Date  . CARDIAC CATHETERIZATION    . CATARACT EXTRACTION Bilateral 2013  . COLONOSCOPY  11/17/2010   Procedure: COLONOSCOPY;  Surgeon: [name], MD;  Location[facility];  Service: Endoscopy;  Laterality: N/A;  10:45 am  . ESOPHAGOGASTRODUODENOSCOPY  11/17/2010   Procedure: ESOPHAGOGASTRODUODENOSCOPY (EGD);  Surgeon: [name], MD;  Location: [facility];  Service: Endoscopy;  Laterality: N/A;  . ESOPHAGOGASTRODUODENOSCOPY N/A 02/16/2015   Procedure: ESOPHAGOGASTRODUODENOSCOPY (EGD);  Surgeon: [name], MD;   Location: [facility];  Service: Gastroenterology;  Laterality: N/A;  . EXPLORATORY LAPAROTOMY  1960s   For peritonitis of undetermined cause  . EYE SURGERY    . LUMBAR EPIDURAL INJECTION  06-2012--06-2013   pt. states she has had 5 epidurals in 06-2012----06-2013  . MASTECTOMY Left 1990  . YAG LASER APPLICATION Right 2/58/5277   Procedure: YAG LASER APPLICATION;  Surgeon: [name], MD;  Location: [facility];  Service: Ophthalmology;  Laterality: Right;  right  . YAG LASER APPLICATION Left 12/01/2351   Procedure: YAG LASER APPLICATION;  Surgeon: [name], MD;  Location: [facility];  Service: Ophthalmology;  Laterality: Left;    Family History  Problem Relation Age of Onset  . Alzheimer's disease Mother   . Dementia Mother   . Hypertension Mother   . Hyperlipidemia Mother   . Parkinsonism Father     Social History Social History   Tobacco Use  . Smoking status: Former Smoker    Packs/day: 1.00    Years: 56.00    Pack years: 56.00    Types: Cigarettes    Last attempt to quit: 08/14/2006    Years since quitting: 10.7  . Smokeless tobacco: Never Used  . Tobacco comment: quit in 2008  Substance Use Topics  . Alcohol use: No    Alcohol/week: 0.0 oz  . Drug use: No    Allergies  Allergen Reactions  . Iodine  Rash and Other (See Comments)    BETADINE Rash/burning, blisters on skin.  . Iohexol Rash and Other (See Comments)    Blisters; PT NEEDS 13-HOUR PREP   . Tape Itching and Rash    Prefers PAPER TAPE    Current Outpatient Medications  Medication Sig Dispense Refill  . amiodarone (PACERONE) 200 MG tablet TAKE 1 TABLET BY MOUTH  DAILY 90 tablet 3  . amLODipine (NORVASC) 5 MG tablet TAKE 1 TABLET BY MOUTH  DAILY 90 tablet 2  . calcium-vitamin D (OSCAL) 250-125 MG-UNIT per tablet Take 1 tablet by mouth 2 (two) times daily.      . clopidogrel (PLAVIX) 75 MG tablet TAKE 1 TABLET BY MOUTH  EVERY MORNING 90 tablet 3  . ELIQUIS 5 MG TABS tablet TAKE 1 TABLET BY MOUTH TWO   TIMES DAILY 180 tablet 3  . ferrous sulfate 325 (65 FE) MG tablet Take 325 mg by mouth daily with breakfast.    . HYDROcodone-acetaminophen (NORCO/VICODIN) 5-325 MG tablet Take 1 tablet by mouth every 6 (six) hours as needed for moderate pain.    [name] irbesartan (AVAPRO) 150 MG tablet TAKE 1 TABLET BY MOUTH  EVERY EVENING 90 tablet 1  . magnesium oxide (MAG-OX) 400 MG tablet Take 400 mg by mouth every morning.     . metoprolol tartrate (LOPRESSOR) 25 MG tablet TAKE 3 TABLETS BY MOUTH TWO TIMES DAILY 540 tablet 3  . Multiple Vitamin (MULTIVITAMIN WITH MINERALS) TABS tablet Take 1 tablet by mouth every morning.    . naloxegol oxalate (MOVANTIK) 25 MG TABS tablet Take 25 mg by mouth daily as needed (constipation).     [name] oxyCODONE-acetaminophen (PERCOCET/ROXICET) 5-325 MG tablet Take 1-2 tablets by mouth daily as needed for pain.    . pantoprazole (PROTONIX) 40 MG tablet Take 40 mg by mouth daily.    . pravastatin (PRAVACHOL) 20 MG tablet TAKE 1 TABLET BY MOUTH  DAILY 90 tablet 2  . Tiotropium Bromide Monohydrate (SPIRIVA RESPIMAT) 2.5 MCG/ACT AERS Inhale 2 puffs into the lungs daily. 3 Inhaler 3  . vitamin C (ASCORBIC ACID) 500 MG tablet Take 500 mg by mouth every morning.      No current facility-administered medications for this visit.      Physical Exam  Blood pressure 134/78, pulse (!) 58, temperature (!) 97.3 F (36.3 C).  Constitutional: overall normal hygiene, normal nutrition, well developed, normal grooming, normal body habitus. Assistive device:walker  Musculoskeletal: gait and station Limp left, muscle tone and strength are normal, no tremors or atrophy is present.  .  Neurological: coordination overall normal.  Deep tendon reflex/nerve stretch intact.  Sensation normal.  Cranial nerves II-XII intact.   Skin:   Normal overall no scars, lesions, ulcers or rashes. No psoriasis.  Psychiatric: Alert and oriented x 3.  Recent memory intact, remote memory unclear.  Normal mood and  affect. Well groomed.  Good eye contact.  Cardiovascular: overall no swelling, no varicosities, no edema bilaterally, normal temperatures of the legs and arms, no clubbing, cyanosis and good capillary refill.  Lymphatic: palpation is normal.  Left knee has pain medially over the joint line, ROM is 0 to 110, there is slight crepitus, NV intact.  She has no effusion today.  All other systems reviewed and are negative   The patient has been educated about the nature of the problem(s) and counseled on treatment options.  The patient appeared to understand what I have discussed and is in agreement with it.  Encounter Diagnoses  Name Primary?  . Acute pain of left knee Yes  . Tibial plateau fracture, left, closed, initial encounter     PLAN Call if any problems.  Precautions discussed.  Continue current medications.   Return to clinic 1 month   X-rays of the left knee on return.  Electronically Signed Sanjuana Kava, MD 2/20/20198:44 AM

## 2017-05-31 DIAGNOSIS — L89512 Pressure ulcer of right ankle, stage 2: Secondary | ICD-10-CM | POA: Diagnosis not present

## 2017-06-03 ENCOUNTER — Ambulatory Visit (INDEPENDENT_AMBULATORY_CARE_PROVIDER_SITE_OTHER): Payer: Medicare Other | Admitting: Cardiovascular Disease

## 2017-06-03 ENCOUNTER — Encounter: Payer: Self-pay | Admitting: Cardiovascular Disease

## 2017-06-03 VITALS — BP 154/94 | HR 103 | Ht 62.0 in | Wt 115.0 lb

## 2017-06-03 DIAGNOSIS — I480 Paroxysmal atrial fibrillation: Secondary | ICD-10-CM

## 2017-06-03 DIAGNOSIS — I10 Essential (primary) hypertension: Secondary | ICD-10-CM

## 2017-06-03 DIAGNOSIS — I6523 Occlusion and stenosis of bilateral carotid arteries: Secondary | ICD-10-CM

## 2017-06-03 DIAGNOSIS — E782 Mixed hyperlipidemia: Secondary | ICD-10-CM

## 2017-06-03 NOTE — Patient Instructions (Addendum)
Medication Instructions:  Your physician recommends that you continue on your current medications as directed. Please refer to the Current Medication list given to you today.  Labwork: NONE  Testing/Procedures: Your physician has requested that you have a carotid duplex in June. This test is an ultrasound of the carotid arteries in your neck. It looks at blood flow through these arteries that supply the brain with blood. Allow one hour for this exam. There are no restrictions or special instructions.  Follow-Up: Your physician wants you to follow-up in: 6 months with [name]. You will receive a reminder letter in the mail two months in advance. If you don't receive a letter, please call our office to schedule the follow-up appointment.   If you need a refill on your cardiac medications before your next appointment, please call your pharmacy.

## 2017-06-21 DIAGNOSIS — L89512 Pressure ulcer of right ankle, stage 2: Secondary | ICD-10-CM | POA: Diagnosis not present

## 2017-06-21 DIAGNOSIS — G894 Chronic pain syndrome: Secondary | ICD-10-CM | POA: Diagnosis not present

## 2017-06-21 DIAGNOSIS — I10 Essential (primary) hypertension: Secondary | ICD-10-CM | POA: Diagnosis not present

## 2017-06-21 DIAGNOSIS — E785 Hyperlipidemia, unspecified: Secondary | ICD-10-CM | POA: Diagnosis not present

## 2017-06-21 DIAGNOSIS — Z0001 Encounter for general adult medical examination with abnormal findings: Secondary | ICD-10-CM | POA: Diagnosis not present

## 2017-06-21 DIAGNOSIS — G589 Mononeuropathy, unspecified: Secondary | ICD-10-CM | POA: Diagnosis not present

## 2017-06-21 DIAGNOSIS — I482 Chronic atrial fibrillation: Secondary | ICD-10-CM | POA: Diagnosis not present

## 2017-06-21 DIAGNOSIS — Z8673 Personal history of transient ischemic attack (TIA), and cerebral infarction without residual deficits: Secondary | ICD-10-CM | POA: Diagnosis not present

## 2017-06-21 DIAGNOSIS — D594 Other nonautoimmune hemolytic anemias: Secondary | ICD-10-CM | POA: Diagnosis not present

## 2017-06-21 DIAGNOSIS — M816 Localized osteoporosis [Lequesne]: Secondary | ICD-10-CM | POA: Diagnosis not present

## 2017-06-21 DIAGNOSIS — E782 Mixed hyperlipidemia: Secondary | ICD-10-CM | POA: Diagnosis not present

## 2017-06-21 DIAGNOSIS — K219 Gastro-esophageal reflux disease without esophagitis: Secondary | ICD-10-CM | POA: Diagnosis not present

## 2017-06-21 DIAGNOSIS — R799 Abnormal finding of blood chemistry, unspecified: Secondary | ICD-10-CM | POA: Diagnosis not present

## 2017-06-27 DIAGNOSIS — S82142A Displaced bicondylar fracture of left tibia, initial encounter for closed fracture: Secondary | ICD-10-CM | POA: Insufficient documentation

## 2017-07-01 ENCOUNTER — Ambulatory Visit (INDEPENDENT_AMBULATORY_CARE_PROVIDER_SITE_OTHER): Payer: Medicare Other

## 2017-07-01 ENCOUNTER — Ambulatory Visit (INDEPENDENT_AMBULATORY_CARE_PROVIDER_SITE_OTHER): Payer: Self-pay | Admitting: Orthopedic Surgery

## 2017-07-01 ENCOUNTER — Encounter: Payer: Self-pay | Admitting: Orthopedic Surgery

## 2017-07-01 DIAGNOSIS — S82142D Displaced bicondylar fracture of left tibia, subsequent encounter for closed fracture with routine healing: Secondary | ICD-10-CM | POA: Diagnosis not present

## 2017-07-01 NOTE — Patient Instructions (Signed)
Weight-bear as tolerated in the brace

## 2017-07-01 NOTE — Progress Notes (Signed)
Fracture care follow-up  Chief Complaint  Patient presents with  . Knee Pain    left knee / tibial plateau fracture approx 05/10/17    Encounter Diagnosis  Name Primary?  . Closed fracture of left tibial plateau with routine healing, subsequent encounter approx. 05/10/17     77 year old female being treated for insufficiency fracture medial tibial plateau  She has been nonweightbearing and has no symptoms at this time     Current Outpatient Medications:  .  amiodarone (PACERONE) 200 MG tablet, TAKE 1 TABLET BY MOUTH  DAILY, Disp: 90 tablet, Rfl: 1 .  amLODipine (NORVASC) 5 MG tablet, TAKE 1 TABLET BY MOUTH  DAILY, Disp: 90 tablet, Rfl: 2 .  calcium-vitamin D (OSCAL) 250-125 MG-UNIT per tablet, Take 1 tablet by mouth 2 (two) times daily.  , Disp: , Rfl:  .  clopidogrel (PLAVIX) 75 MG tablet, TAKE 1 TABLET BY MOUTH  EVERY MORNING, Disp: 90 tablet, Rfl: 1 .  ELIQUIS 5 MG TABS tablet, TAKE 1 TABLET BY MOUTH TWO  TIMES DAILY, Disp: 180 tablet, Rfl: 3 .  ferrous sulfate 325 (65 FE) MG tablet, Take 325 mg by mouth daily with breakfast., Disp: , Rfl:  .  HYDROcodone-acetaminophen (NORCO/VICODIN) 5-325 MG tablet, Take 1 tablet by mouth every 6 (six) hours as needed for moderate pain., Disp: , Rfl:  .  irbesartan (AVAPRO) 150 MG tablet, TAKE 1 TABLET BY MOUTH  EVERY EVENING, Disp: 90 tablet, Rfl: 1 .  magnesium oxide (MAG-OX) 400 MG tablet, Take 400 mg by mouth every morning. , Disp: , Rfl:  .  metoprolol tartrate (LOPRESSOR) 25 MG tablet, TAKE 3 TABLETS BY MOUTH TWO TIMES DAILY, Disp: 540 tablet, Rfl: 1 .  Multiple Vitamin (MULTIVITAMIN WITH MINERALS) TABS tablet, Take 1 tablet by mouth every morning., Disp: , Rfl:  .  naloxegol oxalate (MOVANTIK) 25 MG TABS tablet, Take 25 mg by mouth daily as needed (constipation). , Disp: , Rfl:  .  oxyCODONE-acetaminophen (PERCOCET/ROXICET) 5-325 MG tablet, Take 1-2 tablets by mouth daily as needed for pain., Disp: , Rfl:  .  pantoprazole (PROTONIX) 40 MG  tablet, Take 40 mg by mouth daily., Disp: , Rfl:  .  pravastatin (PRAVACHOL) 20 MG tablet, TAKE 1 TABLET BY MOUTH  DAILY, Disp: 90 tablet, Rfl: 2 .  Tiotropium Bromide Monohydrate (SPIRIVA RESPIMAT) 2.5 MCG/ACT AERS, Inhale 2 puffs into the lungs daily., Disp: 3 Inhaler, Rfl: 3 .  vitamin C (ASCORBIC ACID) 500 MG tablet, Take 500 mg by mouth every morning. , Disp: , Rfl:   BP 107/83   Pulse (!) 59   Ht 5\' 2"  (1.575 m)   BMI 21.03 kg/m   Physical Exam  Musculoskeletal:       Legs:    Xrays: Insufficiency fracture line is noted bony sclerosis indicating healing recommend economy hinged brace weightbearing as tolerated  Plan  Economy hinged brace weight-bear as tolerated follow-up x-ray 6 weeks

## 2017-07-11 ENCOUNTER — Ambulatory Visit (INDEPENDENT_AMBULATORY_CARE_PROVIDER_SITE_OTHER): Payer: Medicare Other | Admitting: Internal Medicine

## 2017-07-11 ENCOUNTER — Encounter: Payer: Self-pay | Admitting: Internal Medicine

## 2017-07-11 VITALS — BP 116/72 | HR 65 | Ht 63.0 in | Wt 117.6 lb

## 2017-07-11 DIAGNOSIS — I6523 Occlusion and stenosis of bilateral carotid arteries: Secondary | ICD-10-CM

## 2017-07-11 DIAGNOSIS — J449 Chronic obstructive pulmonary disease, unspecified: Secondary | ICD-10-CM | POA: Diagnosis not present

## 2017-07-11 MED ORDER — TIOTROPIUM BROMIDE MONOHYDRATE 2.5 MCG/ACT IN AERS: 2.0000 | INHALATION_SPRAY | Freq: Every day | RESPIRATORY_TRACT | 0 refills | Status: DC

## 2017-07-11 MED ORDER — ALBUTEROL SULFATE HFA 108 (90 BASE) MCG/ACT IN AERS: 2.0000 | INHALATION_SPRAY | Freq: Four times a day (QID) | RESPIRATORY_TRACT | 3 refills | Status: DC | PRN

## 2017-07-11 MED ORDER — TIOTROPIUM BROMIDE MONOHYDRATE 2.5 MCG/ACT IN AERS: 2.0000 | INHALATION_SPRAY | Freq: Every day | RESPIRATORY_TRACT | 3 refills | Status: DC

## 2017-07-11 NOTE — Patient Instructions (Addendum)
ICD-10-CM   1. COPD, moderate ([place]) J44.9     Stable copd  Plan Improve compliance with spiriva - do respimat once daily; take sample Do albuterol as needed  - cma to ensure refills  Followup 1 year or sooner if needed

## 2017-07-11 NOTE — Addendum Note (Signed)
Addended by: Lorretta Harp on: 07/11/2017 11:03 AM   Modules accepted: Orders

## 2017-07-11 NOTE — Progress Notes (Signed)
Subjective:     Patient ID: [name], female   DOB: [birth year], 77 y.o.   MRN: [id number]  HPI    OV 11/22/2015  Chief Complaint  Patient presents with  . Follow-up    Pt states that she tries to take the [place] Resp daily - sometimes she forgets. Denies current breathing issues. Pt states that she does not have to use the Albuterol HFA.    77 year old female former smoker with significant neuromuscular disability due to prior stroke, spinal issues.    STUDIES:  CT chest (05/2014) Masslike area of consolidation RLL, r hilar and mediastinal LAN CT chest (06/2014) Significant improvement of RLL opacification, cavitary lesion persists suggestive of resolving PNA with necrosis.  CT chest (09/2014) Near complete collapse of the right lower lobe cavitary lesion with residual pleural-parenchymal thickening. New fine ground-glass nodules in the right middle lobe consistent with pulmonary infection. CT chest (02/15/2015) Recurrent to rounded masslike area of consolidation in the right lower lobe. Given that this area was present previously then resolved and has recurrent quickly, this is most compatible with rounded pneumonia. Small right pleural effusion with adjacent right lower lobe atelectasis or consolidation. Mildly enlarged precarinal lymph node, likely reactive. CT chest 04/13/15 >Interval improvement in masslike consolidation in the right lower lobe, without complete resolution, favoring resolving pneumonia. 2. Locule of air at the lateral base of the right hemi thorax may be within necrotic lung. Bronchopleural fistula cannot be definitively excluded. 3. Small, partially loculated right pleural effusion. 4. 4 mm left lower lobe nodule, stable.    PCP Delphina Cahill, MD REferrd by dr Johnsie Cancel HPI  IOV 07/09/2014  Chief Complaint  Patient presents with  . Pulmonary Consult    Pt referred by [name] fpr abnormal CT. Pt denies SOB, cough, and CP/tightness. Pt stated she went to  hospital for pna on 05/20/14.     77 year old female with significant neuromuscular disability due to prior stroke, spinal issues. Previous greater than 50 pack smoker. Was admitted around 05/20/2014 with significant right lower lobe pneumonia/huge masslike appearance on CT scan of the chest. Apparently she was extensively asymptomatic at this point in time. Treated with antibiotics. She had follow-up CT scan of the chest which shows significant improvement in the right lower lobe opacity but with a residual 5.2 cm thick-walled cavitary lesion. There for she's here for follow-up. At baseline she reports no dyspnea but then she is extremely disabled and is only able to move a little bit with pushing a wheelchair and this is because of a spinal issues. She only rarely feels short of breath and manages with pro-air when necessary. She's never had lung function test in the past. She denies any cough. Currently she feels better     OV 09/29/2014  Chief Complaint  Patient presents with  . Follow-up    Pt here after CT scan. Pt states her breathing is unchanged since last OV. Pt has left ankle edema dt recent sprain, pt is seeing PCP for the issue.     Follow-up   - Right lower lobe lung cavity following pneumonia February 2016: She had CT scan of the chest June 2016. This showed near resolution of the cavity and it is down to a scar tissue. I personally visualized image dated 09/08/2014  - New issue: CT scan of the chest 09/08/2014 compared to CT scans of the chest March 2016 show some new right middle lobe groundglass nodules that are extremely small. She denies she was  sick at that time  - Other issue: History of smoking with shortness of breath: She denies any shortness of breath or cough but on deeper questioning she does admit to occasional shortness of breath that is very rare. She uses pro-air may be once every 2 weeks. She feels good. She's never had pulmonary function testing. She has a  history of 56 pack smoking history but is quit. She does not want to pulmonary function test now but will do it in the future   06/03/2015 Acute OV : Cavitary PNA  Pt presents for work in visit. Was seen 1 month ago . She is recovering from recurrent cavitary PNA on the right.  Previous episode in 05/2014 with right sided mass like consolidation. She was treated with abx. Serial CT showed near complete resolution on CT chest 09/2014.  Admitted in Nov 2016 with acute GI bleed and cavitary lung lesion. EGD showed a normal esophagus and gastritis with numerous clean, linear ulcers of the stomach and a few ulcers of the duodenal bulb. She did stabilize and was transitioned back on Eliquis 5mg  Twice daily   Prior to discharge CT chest showed a recurrent rounded masslike area of consolidation on the right lower lobe measuring 9.5 x 6.7 cm.  She was treated with aggressive ABX . Follow up CT chest in Jan showed interval improvement in RLL consolidation. Clinically she has been slowly improving.  She is very frail and has previous stroke years ago. She is on chronic pain meds as well for back issues.  She denies any swallow issues but does admit she eats in bed often. Says this week has not felt as good  Says that her right side was hurting when she lied down in bed and not as much energy. Feels much better today. CXR today shows further improvement in RLL opacity . No new areas noted.  She denies any bleeding , n/v/d , fever or chest pain. , orthopnea, hemoptysis , or edema.  Has ov with PCP next week for labs.  Last labs last month w/ improved H/H .    OV 07/12/2015  Chief Complaint  Patient presents with  . Follow-up    Pt last seen on 06/03/15 for pna. Pt states she is feeling well now. Pt deines SOB, cough, wheezing, CP/tightness, f/c/s.     Follow-up recurrent pneumonia. Most recently 6 weeks ago she saw nurse practitioner. She says she is now recovered from respiratory infection. She and her  husband state that she gets recurrent respiratory infection and is looking at ways to prevent it. Pulmonary function test today shows gold stage II COPD. CT scan chest as a follow-up from pneumonia 2 years ago shows that things have resolved to scar tissue. There is no lung mass. Overall she's asymptomatic from a respiratory standpoint at baseline but then she is largely disabled because of spinal issues and does not exert much.  CT and PFT were personally visualized  Pulmonary function test 07/08/2015 FEV1 1.44 L/70%. No post bronchodilator response. FVC 2.1 L/76% and a ratio 69 consistent with Gold stage II COPD. Total lung capacity is 98%. DLCO is reduced at 11.33/46%  Ct Chest Wo Contrast  07/11/2015  CLINICAL DATA:  Lung nodules. EXAM: CT CHEST WITHOUT CONTRAST TECHNIQUE: Multidetector CT imaging of the chest was performed following the standard protocol without IV contrast. COMPARISON:  CT chest exams dating back to 01/15/2008. FINDINGS: Mediastinum/Nodes: Sub cm low-attenuation lesion in the right lobe of the thyroid is incidentally noted.  Mediastinal lymph nodes are not enlarged by CT size criteria. Hilar regions are difficult to definitively evaluate without IV contrast. No axillary adenopathy. Surgical clips in the left axilla. Atherosclerotic calcification of the arterial vasculature. Dense calcification and narrowing of the right brachiocephalic artery. Three-vessel coronary artery calcification. Heart size normal. No pericardial effusion. Small hiatal hernia. Calcified subcarinal lymph node. Lungs/Pleura: Right apical pleural parenchymal scarring. Mild centrilobular emphysema. Post infectious scarring in the right middle and right lower lobes. A few scattered pulmonary nodules measure 4 mm or less in size, unchanged. Some are calcified. No pleural fluid. Airway is unremarkable. Upper abdomen: Visualized portions of the liver, adrenal glands unremarkable. Probable renal vascular calcifications  bilaterally. Difficult to exclude renal stones as well. Visualized portions of the spleen, pancreas and stomach are grossly unremarkable. Musculoskeletal: Degenerative changes are seen in the spine. No worrisome lytic or sclerotic lesions. Lower thoracic compression fractures, as before. IMPRESSION: 1. Post infectious scarring in the right middle and right lower lobes. 2. Three-vessel coronary artery calcification. 3. Advanced atherosclerotic calcification and narrowing of the proximal right brachiocephalic artery. Electronically Signed   By: Lorin Picket M.D.   On: 07/11/2015 11:25    OV 11/22/2015  Chief Complaint  Patient presents with  . Follow-up    Pt states that she tries to take the [place] Resp daily - sometimes she forgets. Denies current breathing issues. Pt states that she does not have to use the Albuterol HFA.     Follow-up Gold stage II COPD in the setting of neuromuscular issues due to spine and stroke. Currently on Spiriva.she is here with her husband. Overall she's doing well. She never uses albuterol for rescue since her last visit. No interim exacerbations or urgent care visits. Sleeps well. She's not interested in pulmonary rehabilitation. She continues to Spiriva. She takes flu shot in the fall and is willing to do so again.    has a past medical history of Anemia (05/2014.); Atrial fibrillation ([place]); Breast cancer ([place]); Carotid artery occlusion; Carotid bruit; Degenerative arthritis of spine (2015); Hyperlipidemia; Hypertension; Meningioma ([place]); Osteoporosis; Pneumonia (May 19, 2014); Stroke [facility]); Subclavian steal syndrome; and Ulcers of both lower extremities ([place]) (2015).   reports that she quit smoking about 9 years ago. Her smoking use included Cigarettes. She has a 56.00 pack-year smoking history. She has never used smokeless tobacco.   OV 06/27/2016  Chief Complaint  Patient presents with  . Follow-up    pt states she is doing well at this time, denies  any breathing complaints.     Follow-up moderate COPD on single agent Spiriva. Last seen August 2017. She is here with her husband. In the interim no COPD exacerbations of prednisone use or antibody use or admissions to the hospital emergency room visits for any reason. She is followed up with the gastroenterologist and neurologist for previous histories of atherosclerosis and GI bleeds. She is here with her husband. She did admit that she is not fully compliant with the Spiriva but she stable.   OV 07/11/2017  Chief Complaint  Patient presents with  . Follow-up    1-year follow up.  Pt stated she fell 05/10/17 and was off of her feet x6 weeks. Pt has SOB with exertion. States she has been noncompliant when it comes to using her inhalers or neb machine.     Follow-up moderate COPD on single agent Spiriva and a wheelchair-bound female  There is a 1 year follow-up.  Since her last visit overall stable but  earlier this year had hairline fracture in her left tibia.  And was then bedbound and also wheelchair-bound.  She has a brace.  She has had routine cardiology follow-up.  In terms of her respiratory status she is stable.  However she says she missed taking her Spiriva inhalers for many months.  She does get some exertional dyspnea but only when there is too much exertion but for because she is wheelchair-bound and bedbound she does not feel it.  But she does recognize the need to take Spiriva on a regular basis.  She is up-to-date with her vaccines.   Marland Kitchen CAT COPD Symptom & Quality of Life Score (GSK trademark) 0 is no burden. 5 is highest burden 07/11/2017   Never Cough -> Cough all the time 0  No phlegm in chest -> Chest is full of phlegm 0  No chest tightness -> Chest feels very tight 0  No dyspnea for 1 flight stairs/hill -> Very dyspneic for 1 flight of stairs 3  No limitations for ADL at home -> Very limited with ADL at home 5  Confident leaving home -> Not at all confident leaving home 0   Sleep soundly -> Do not sleep soundly because of lung condition 0  Lots of Energy -> No energy at all 3  TOTAL Score (max 40)  11        has a past medical history of Anemia (05/2014.), Atrial fibrillation ([place]), Breast cancer ([place]), Carotid artery occlusion, Carotid bruit, Degenerative arthritis of spine (2015), Hyperlipidemia, Hypertension, Meningioma ([place]), Osteoporosis, Pneumonia (May 19, 2014), Stroke [facility]), Subclavian steal syndrome, and Ulcers of both lower extremities ([place]) (2015).   reports that she quit smoking about 10 years ago. Her smoking use included cigarettes. She has a 56.00 pack-year smoking history. She has never used smokeless tobacco.  Past Surgical History:  Procedure Laterality Date  . CARDIAC CATHETERIZATION    . CATARACT EXTRACTION Bilateral 2013  . COLONOSCOPY  11/17/2010   Procedure: COLONOSCOPY;  Surgeon: [name], MD;  [facility];  Service: Endoscopy;  Laterality: N/A;  10:45 am  . ESOPHAGOGASTRODUODENOSCOPY  11/17/2010   Procedure: ESOPHAGOGASTRODUODENOSCOPY (EGD);  Surgeon: [name], MD;  [facility];  Service: Endoscopy;  Laterality: N/A;  . ESOPHAGOGASTRODUODENOSCOPY N/A 02/16/2015   Procedure: ESOPHAGOGASTRODUODENOSCOPY (EGD);  Surgeon: [name], MD;  Location: [facility];  Service: Gastroenterology;  Laterality: N/A;  . EXPLORATORY LAPAROTOMY  1960s   For peritonitis of undetermined cause  . EYE SURGERY    . LUMBAR EPIDURAL INJECTION  06-2012--06-2013   pt. states she has had 5 epidurals in 06-2012----06-2013  . MASTECTOMY Left 1990  . YAG LASER APPLICATION Right 7/32/2025   Procedure: YAG LASER APPLICATION;  Surgeon: [name], MD;  Location: [facility];  Service: Ophthalmology;  Laterality: Right;  right  . YAG LASER APPLICATION Left 08/03/621   Procedure: YAG LASER APPLICATION;  Surgeon: [name], MD;  Location: [facility];  Service: Ophthalmology;  Laterality: Left;    Allergies   Allergen Reactions  . Iodine Rash and Other (See Comments)    BETADINE Rash/burning, blisters on skin.  . Iohexol Rash and Other (See Comments)    Blisters; PT NEEDS 13-HOUR PREP   . Tape Itching and Rash    Prefers PAPER TAPE    Immunization History  Administered Date(s) Administered  . Influenza Split 01/07/2014  . Influenza, High Dose Seasonal PF 01/28/2016, 01/07/2017  . Influenza,inj,Quad PF,6+ Mos 01/12/2015  . Influenza-Unspecified 01/07/2013  .  Pneumococcal Conjugate-13 01/07/2014  . Pneumococcal Polysaccharide-23 01/07/2017  . Tdap 08/01/2011  . Zoster Recombinat (Shingrix) 01/10/2017    Family History  Problem Relation Age of Onset  . Alzheimer's disease Mother   . Dementia Mother   . Hypertension Mother   . Hyperlipidemia Mother   . Parkinsonism Father      Current Outpatient Medications:  .  amiodarone (PACERONE) 200 MG tablet, TAKE 1 TABLET BY MOUTH  DAILY, Disp: 90 tablet, Rfl: 1 .  amLODipine (NORVASC) 5 MG tablet, TAKE 1 TABLET BY MOUTH  DAILY, Disp: 90 tablet, Rfl: 2 .  calcium-vitamin D (OSCAL) 250-125 MG-UNIT per tablet, Take 1 tablet by mouth 2 (two) times daily.  , Disp: , Rfl:  .  clopidogrel (PLAVIX) 75 MG tablet, TAKE 1 TABLET BY MOUTH  EVERY MORNING, Disp: 90 tablet, Rfl: 1 .  ELIQUIS 5 MG TABS tablet, TAKE 1 TABLET BY MOUTH TWO  TIMES DAILY, Disp: 180 tablet, Rfl: 3 .  ferrous sulfate 325 (65 FE) MG tablet, Take 325 mg by mouth daily with breakfast., Disp: , Rfl:  .  irbesartan (AVAPRO) 150 MG tablet, TAKE 1 TABLET BY MOUTH  EVERY EVENING, Disp: 90 tablet, Rfl: 1 .  magnesium oxide (MAG-OX) 400 MG tablet, Take 400 mg by mouth every morning. , Disp: , Rfl:  .  metoprolol tartrate (LOPRESSOR) 25 MG tablet, TAKE 3 TABLETS BY MOUTH TWO TIMES DAILY, Disp: 540 tablet, Rfl: 1 .  Multiple Vitamin (MULTIVITAMIN WITH MINERALS) TABS tablet, Take 1 tablet by mouth every morning., Disp: , Rfl:  .  naloxegol oxalate (MOVANTIK) 25 MG TABS tablet, Take 25 mg by  mouth daily as needed (constipation). , Disp: , Rfl:  .  pantoprazole (PROTONIX) 40 MG tablet, Take 40 mg by mouth daily., Disp: , Rfl:  .  pravastatin (PRAVACHOL) 20 MG tablet, TAKE 1 TABLET BY MOUTH  DAILY, Disp: 90 tablet, Rfl: 2 .  vitamin C (ASCORBIC ACID) 500 MG tablet, Take 500 mg by mouth every morning. , Disp: , Rfl:  .  albuterol (PROVENTIL HFA;VENTOLIN HFA) 108 (90 Base) MCG/ACT inhaler, Inhale into the lungs., Disp: , Rfl:  .  oxyCODONE-acetaminophen (PERCOCET/ROXICET) 5-325 MG tablet, Take 1-2 tablets by mouth daily as needed for pain., Disp: , Rfl:  .  Tiotropium Bromide Monohydrate (SPIRIVA RESPIMAT) 2.5 MCG/ACT AERS, Inhale 2 puffs into the lungs daily. (Patient not taking: Reported on 07/11/2017), Disp: 3 Inhaler, Rfl: 3     Review of Systems     Objective:   Physical Exam Vitals:   07/11/17 1025  BP: 116/72  Pulse: 65  SpO2: 98%  Weight: 117 lb 9.6 oz (53.3 kg)  Height: 5\' 3"  (1.6 m)    Estimated body mass index is 20.83 kg/m as calculated from the following:   Height as of this encounter: 5\' 3"  (1.6 m).   Weight as of this encounter: 117 lb 9.6 oz (53.3 kg).    General Appearance:    Thin frail on wheel chair  Head:    Normocephalic, without obvious abnormality, atraumatic  Eyes:    PERRL - yes, conjunctiva/corneas - yes      Ears:    Normal external ear canals, both ears  Nose:   NG tube - no  Throat:  ETT TUBE - no , OG tube - no  Neck:   Supple,  No enlargement/tenderness/nodules     Lungs:     Clear to auscultation bilaterally,   Chest wall:    No deformity  Heart:    S1 and S2 normal, no murmur, CVP - no.  Pressors - no  Abdomen:     Soft, no masses, no organomegaly  Genitalia:    Not done  Rectal:   not done  Extremities:   Extremities- left knee brace, both ankles bandaid. In wheel chair     Skin:   Intact in exposed areas .     Neurologic:   Sedation - none -> RASS - na . Moves all 4s - yes. CAM-ICU - neg . Orientation - x3 +         Assessment:       ICD-10-CM   1. COPD, moderate ([place]) J44.9        Plan:      Stable copd  Plan Improve compliance with spiriva - do respimat once daily; take sample Do albuterol as needed  - cma to ensure refills  Followup 1 year or sooner if needed    Dr. Brand Males, M.D., [facility].C.P Pulmonary and Critical Care Medicine Staff Physician, [place] Director - Interstitial Lung Disease  Program  Pulmonary [place] [facility] [place], [place], [address]  Pager: [phone], If no answer or between  15:00h - 7:00h: call [phone] Telephone: [phone]

## 2017-07-12 DIAGNOSIS — L8992 Pressure ulcer of unspecified site, stage 2: Secondary | ICD-10-CM | POA: Diagnosis not present

## 2017-07-15 ENCOUNTER — Other Ambulatory Visit: Payer: Self-pay | Admitting: *Deleted

## 2017-07-15 ENCOUNTER — Ambulatory Visit (INDEPENDENT_AMBULATORY_CARE_PROVIDER_SITE_OTHER): Payer: Medicare Other

## 2017-07-15 ENCOUNTER — Encounter: Payer: Self-pay | Admitting: Orthopedic Surgery

## 2017-07-15 ENCOUNTER — Ambulatory Visit (INDEPENDENT_AMBULATORY_CARE_PROVIDER_SITE_OTHER): Payer: Medicare Other | Admitting: Orthopedic Surgery

## 2017-07-15 VITALS — BP 128/68 | HR 60 | Ht 62.0 in

## 2017-07-15 DIAGNOSIS — S82142D Displaced bicondylar fracture of left tibia, subsequent encounter for closed fracture with routine healing: Secondary | ICD-10-CM

## 2017-07-15 DIAGNOSIS — M25561 Pain in right knee: Secondary | ICD-10-CM | POA: Diagnosis not present

## 2017-07-15 DIAGNOSIS — I6523 Occlusion and stenosis of bilateral carotid arteries: Secondary | ICD-10-CM | POA: Diagnosis not present

## 2017-07-15 DIAGNOSIS — S83411A Sprain of medial collateral ligament of right knee, initial encounter: Secondary | ICD-10-CM | POA: Diagnosis not present

## 2017-07-15 MED ORDER — ALBUTEROL SULFATE 108 (90 BASE) MCG/ACT IN AEPB: 2.0000 | INHALATION_SPRAY | Freq: Four times a day (QID) | RESPIRATORY_TRACT | 3 refills | Status: DC | PRN

## 2017-07-15 NOTE — Progress Notes (Signed)
Progress Note  New problem  Patient ID: [name], female   DOB: October 20, 1940, 77 y.o.   MRN: [id number]  Chief Complaint  Patient presents with  . Knee Pain    now with R knee pain s/p fall on 07/07/17    77 year old female being followed for proximal tibial fracture medial side doing well with bracing started weightbearing as tolerated last visit about 2 weeks ago doing well  Slipped out of a chair fell about a week and half ago complains of dull aching medial right knee pain without associated catching locking giving way.  Apparently when she fell she just sort of slid down.    Review of Systems  Skin:       Wound noted right leg along the heel/foot  Neurological:       No new neurologic deficits   Current Meds  Medication Sig  . albuterol (PROVENTIL HFA;VENTOLIN HFA) 108 (90 Base) MCG/ACT inhaler Inhale 2 puffs into the lungs every 6 (six) hours as needed for wheezing or shortness of breath.  . Albuterol Sulfate (PROAIR RESPICLICK) 782 (90 Base) MCG/ACT AEPB Inhale 2 puffs into the lungs every 6 (six) hours as needed.  Marland Kitchen amiodarone (PACERONE) 200 MG tablet TAKE 1 TABLET BY MOUTH  DAILY  . amLODipine (NORVASC) 5 MG tablet TAKE 1 TABLET BY MOUTH  DAILY  . calcium-vitamin D (OSCAL) 250-125 MG-UNIT per tablet Take 1 tablet by mouth 2 (two) times daily.    . clopidogrel (PLAVIX) 75 MG tablet TAKE 1 TABLET BY MOUTH  EVERY MORNING  . ELIQUIS 5 MG TABS tablet TAKE 1 TABLET BY MOUTH TWO  TIMES DAILY  . ferrous sulfate 325 (65 FE) MG tablet Take 325 mg by mouth daily with breakfast.  . irbesartan (AVAPRO) 150 MG tablet TAKE 1 TABLET BY MOUTH  EVERY EVENING  . magnesium oxide (MAG-OX) 400 MG tablet Take 400 mg by mouth every morning.   . metoprolol tartrate (LOPRESSOR) 25 MG tablet TAKE 3 TABLETS BY MOUTH TWO TIMES DAILY  . Multiple Vitamin (MULTIVITAMIN WITH MINERALS) TABS tablet Take 1 tablet by mouth every morning.  . naloxegol oxalate (MOVANTIK) 25 MG TABS tablet Take 25 mg by mouth  daily as needed (constipation).   . pantoprazole (PROTONIX) 40 MG tablet Take 40 mg by mouth daily.  . pravastatin (PRAVACHOL) 20 MG tablet TAKE 1 TABLET BY MOUTH  DAILY  . Tiotropium Bromide Monohydrate (SPIRIVA RESPIMAT) 2.5 MCG/ACT AERS Inhale 2 puffs into the lungs daily.  . Tiotropium Bromide Monohydrate (SPIRIVA RESPIMAT) 2.5 MCG/ACT AERS Inhale 2 puffs into the lungs daily.  . vitamin C (ASCORBIC ACID) 500 MG tablet Take 500 mg by mouth every morning.     Past Medical History:  Diagnosis Date  . Anemia 05/2014.  Marland Kitchen Atrial fibrillation ([place])    x 3 yrs  . Breast cancer ([place])    S/P left mastectomy and chemotherapy 1989 remained in remission  . Carotid artery occlusion   . Carotid bruit    LICA 95-62% (duplex 1/30)  . Degenerative arthritis of spine 2015   Chronic back pain  . Hyperlipidemia   . Hypertension   . Meningioma ([place])   . Osteoporosis   . Pneumonia May 19, 2014  . Stroke [facility])    CVA 2008  . Subclavian steal syndrome   . Ulcers of both lower extremities ([place]) 2015     Allergies  Allergen Reactions  . Iodine Rash and Other (See Comments)    BETADINE Rash/burning, blisters  on skin.  . Iohexol Rash and Other (See Comments)    Blisters; PT NEEDS 13-HOUR PREP   . Tape Itching and Rash    Prefers PAPER TAPE    BP 128/68   Pulse 60   Ht 5\' 2"  (1.575 m)   BMI 21.51 kg/m    Physical Exam  Constitutional: She is oriented to person, place, and time. She appears well-developed and well-nourished.  Neurological: She is alert and oriented to person, place, and time.  Psychiatric: She has a normal mood and affect. Judgment normal.  Vitals reviewed.   Ortho Exam Gait is primarily supported by walker and wheelchair  Tenderness medial collateral ligament without instability or gapping on stress testing range of motion is full and free extensor mechanism is intact with straight leg raise no atrophy is noted skin is otherwise warm dry and intact over the  knee area distal pulses are normal sensation is normal in the right leg around the knee.  Left knee continues to improve with normal range of motion stability strength and alignment  Radiographs will be ordered in the office  Medical decision-making  Imaging: AP lateral and patellar view right knee  No fracture on x-ray please see dictated report  Encounter Diagnoses  Name Primary?  . Closed fracture of left tibial plateau with routine healing, subsequent encounter approx 05/10/17   . Acute pain of right knee   . Tear of MCL (medial collateral ligament) of knee, right, initial encounter Yes    Plan 6 weeks of bracing, full weightbearing  Arther Abbott, MD 07/15/2017 11:04 AM

## 2017-07-15 NOTE — Progress Notes (Signed)
Changed pt's rescue inhaler from albuterol inhaler to proair respiclick as it alternative covered inhaler with pt's insurance.

## 2017-07-18 ENCOUNTER — Telehealth: Payer: Self-pay | Admitting: Radiology

## 2017-07-18 NOTE — Telephone Encounter (Signed)
Does she need a pain med

## 2017-07-18 NOTE — Telephone Encounter (Signed)
I called patient, she left message for me to call her. Left message for her to call me back. She wants to know about WB on her leg. I have advised her to WB as tolerated, start with walker, gradually increase. She has asked how long before she feels better, I advised several weeks, but should gradually improve, if it worsens instead of improves, we need to know. She has asked about braces on the knees, I have advised her to continue both economy knee braces until she feels better  To you FYI

## 2017-07-19 NOTE — Telephone Encounter (Signed)
No, she told me in the office she has the Oxycodone, and uses it prn, her questions were more about her activity level.

## 2017-07-19 NOTE — Telephone Encounter (Signed)
As tolereted

## 2017-07-22 ENCOUNTER — Telehealth: Payer: Self-pay | Admitting: Internal Medicine

## 2017-07-22 DIAGNOSIS — L89519 Pressure ulcer of right ankle, unspecified stage: Secondary | ICD-10-CM | POA: Diagnosis not present

## 2017-07-22 NOTE — Telephone Encounter (Signed)
Called and spoke with pt letting her know the spiriva was the inhaler for her to use every day doing 2 puffs in the morning. Also stated to pt that the proair inhaler was a rescue/backup inhaler if she needed it.  Pt expressed understanding. Nothing further needed at this time.

## 2017-07-25 ENCOUNTER — Telehealth: Payer: Self-pay | Admitting: Orthopedic Surgery

## 2017-07-25 NOTE — Telephone Encounter (Signed)
Patient called with questions - thought she'd like to see Dr Aline Brochure on Monday; relayed Dr Aline Brochure is out of clinic next week.  Please call to address questions: [phone]

## 2017-07-25 NOTE — Telephone Encounter (Signed)
I called patient back about the right knee pain she states still painful to put her entire weight on her right leg. She was instructed to WBAT, and it may be a couple weeks before she can put full weight without pain. She voiced understanding  To you FYI, unless you have anything further you want me to advise her.

## 2017-08-06 DIAGNOSIS — L89892 Pressure ulcer of other site, stage 2: Secondary | ICD-10-CM | POA: Diagnosis not present

## 2017-08-09 DIAGNOSIS — Z961 Presence of intraocular lens: Secondary | ICD-10-CM | POA: Diagnosis not present

## 2017-08-09 DIAGNOSIS — H01021 Squamous blepharitis right upper eyelid: Secondary | ICD-10-CM | POA: Diagnosis not present

## 2017-08-09 DIAGNOSIS — H01024 Squamous blepharitis left upper eyelid: Secondary | ICD-10-CM | POA: Diagnosis not present

## 2017-08-09 DIAGNOSIS — H01022 Squamous blepharitis right lower eyelid: Secondary | ICD-10-CM | POA: Diagnosis not present

## 2017-08-09 DIAGNOSIS — H01025 Squamous blepharitis left lower eyelid: Secondary | ICD-10-CM | POA: Diagnosis not present

## 2017-08-10 ENCOUNTER — Other Ambulatory Visit: Payer: Self-pay | Admitting: Cardiovascular Disease

## 2017-08-19 DIAGNOSIS — Z6820 Body mass index (BMI) 20.0-20.9, adult: Secondary | ICD-10-CM | POA: Diagnosis not present

## 2017-08-19 DIAGNOSIS — L89512 Pressure ulcer of right ankle, stage 2: Secondary | ICD-10-CM | POA: Diagnosis not present

## 2017-08-26 ENCOUNTER — Ambulatory Visit (INDEPENDENT_AMBULATORY_CARE_PROVIDER_SITE_OTHER): Payer: Medicare Other | Admitting: Orthopedic Surgery

## 2017-08-26 ENCOUNTER — Encounter: Payer: Self-pay | Admitting: Orthopedic Surgery

## 2017-08-26 VITALS — BP 127/72 | HR 59 | Ht 62.0 in | Wt 116.0 lb

## 2017-08-26 DIAGNOSIS — M25561 Pain in right knee: Secondary | ICD-10-CM

## 2017-08-26 DIAGNOSIS — S82142D Displaced bicondylar fracture of left tibia, subsequent encounter for closed fracture with routine healing: Secondary | ICD-10-CM | POA: Diagnosis not present

## 2017-08-26 DIAGNOSIS — I6523 Occlusion and stenosis of bilateral carotid arteries: Secondary | ICD-10-CM

## 2017-08-26 DIAGNOSIS — G8929 Other chronic pain: Secondary | ICD-10-CM

## 2017-08-26 NOTE — Progress Notes (Signed)
Progress Note   Patient ID: HEIKE POUNDS, female   DOB: [birth year], 77 y.o.   MRN: [id number]  Chief Complaint  Patient presents with  . Knee Injury    left 05/10/17 right 07/07/17 feeling better      Medical decision-making Encounter Diagnoses  Name Primary?  . Closed fracture of left tibial plateau with routine healing, subsequent encounter approx 05/10/17  Yes  . Chronic pain of right knee    She is doing well using her wheelchair as a walker  PLAN: Patient can resume normal activities no further treatment needed    No orders of the defined types were placed in this encounter.    Chief Complaint  Patient presents with  . Knee Injury    left 05/10/17 right 07/07/17 feeling better     77 year old female had a left knee injury and then had an MCL injury on the right treated with bracing which she stopped using 5 days ago she using her walker has minimal if any discomfort or restriction    ROS Current Meds  Medication Sig  . albuterol (PROVENTIL HFA;VENTOLIN HFA) 108 (90 Base) MCG/ACT inhaler Inhale 2 puffs into the lungs every 6 (six) hours as needed for wheezing or shortness of breath.  . Albuterol Sulfate (PROAIR RESPICLICK) 119 (90 Base) MCG/ACT AEPB Inhale 2 puffs into the lungs every 6 (six) hours as needed.  Marland Kitchen amiodarone (PACERONE) 200 MG tablet TAKE 1 TABLET BY MOUTH  DAILY  . amLODipine (NORVASC) 5 MG tablet TAKE 1 TABLET BY MOUTH  DAILY  . calcium-vitamin D (OSCAL) 250-125 MG-UNIT per tablet Take 1 tablet by mouth 2 (two) times daily.    . clopidogrel (PLAVIX) 75 MG tablet TAKE 1 TABLET BY MOUTH  EVERY MORNING  . ELIQUIS 5 MG TABS tablet TAKE 1 TABLET BY MOUTH TWO  TIMES DAILY  . ferrous sulfate 325 (65 FE) MG tablet Take 325 mg by mouth daily with breakfast.  . irbesartan (AVAPRO) 150 MG tablet TAKE 1 TABLET BY MOUTH  EVERY EVENING  . magnesium oxide (MAG-OX) 400 MG tablet Take 400 mg by mouth every morning.   . metoprolol tartrate (LOPRESSOR) 25 MG tablet TAKE 3  TABLETS BY MOUTH TWO TIMES DAILY  . Multiple Vitamin (MULTIVITAMIN WITH MINERALS) TABS tablet Take 1 tablet by mouth every morning.  . naloxegol oxalate (MOVANTIK) 25 MG TABS tablet Take 25 mg by mouth daily as needed (constipation).   Marland Kitchen oxyCODONE-acetaminophen (PERCOCET/ROXICET) 5-325 MG tablet Take 1-2 tablets by mouth daily as needed for pain.  . pantoprazole (PROTONIX) 40 MG tablet Take 40 mg by mouth daily.  . pravastatin (PRAVACHOL) 20 MG tablet TAKE 1 TABLET BY MOUTH  DAILY  . Tiotropium Bromide Monohydrate (SPIRIVA RESPIMAT) 2.5 MCG/ACT AERS Inhale 2 puffs into the lungs daily.  . Tiotropium Bromide Monohydrate (SPIRIVA RESPIMAT) 2.5 MCG/ACT AERS Inhale 2 puffs into the lungs daily.  . vitamin C (ASCORBIC ACID) 500 MG tablet Take 500 mg by mouth every morning.     Allergies  Allergen Reactions  . Iodine Rash and Other (See Comments)    BETADINE Rash/burning, blisters on skin.  . Iohexol Rash and Other (See Comments)    Blisters; PT NEEDS 13-HOUR PREP   . Tape Itching and Rash    Prefers PAPER TAPE     BP 127/72   Pulse (!) 59   Ht 5\' 2"  (1.575 m)   Wt 116 lb (52.6 kg)   BMI 21.22 kg/m   Physical Exam  No  instability to the left knee on valgus stress testing she is regained her preinjury range of motion   Arther Abbott, MD 08/26/2017 9:30 AM

## 2017-09-03 DIAGNOSIS — Z0001 Encounter for general adult medical examination with abnormal findings: Secondary | ICD-10-CM | POA: Diagnosis not present

## 2017-09-03 DIAGNOSIS — G894 Chronic pain syndrome: Secondary | ICD-10-CM | POA: Diagnosis not present

## 2017-09-03 DIAGNOSIS — Z6820 Body mass index (BMI) 20.0-20.9, adult: Secondary | ICD-10-CM | POA: Diagnosis not present

## 2017-09-03 DIAGNOSIS — R799 Abnormal finding of blood chemistry, unspecified: Secondary | ICD-10-CM | POA: Diagnosis not present

## 2017-09-03 DIAGNOSIS — M816 Localized osteoporosis [Lequesne]: Secondary | ICD-10-CM | POA: Diagnosis not present

## 2017-09-03 DIAGNOSIS — D594 Other nonautoimmune hemolytic anemias: Secondary | ICD-10-CM | POA: Diagnosis not present

## 2017-09-03 DIAGNOSIS — L89512 Pressure ulcer of right ankle, stage 2: Secondary | ICD-10-CM | POA: Diagnosis not present

## 2017-09-03 DIAGNOSIS — Z8673 Personal history of transient ischemic attack (TIA), and cerebral infarction without residual deficits: Secondary | ICD-10-CM | POA: Diagnosis not present

## 2017-09-03 DIAGNOSIS — E782 Mixed hyperlipidemia: Secondary | ICD-10-CM | POA: Diagnosis not present

## 2017-09-03 DIAGNOSIS — I10 Essential (primary) hypertension: Secondary | ICD-10-CM | POA: Diagnosis not present

## 2017-09-03 DIAGNOSIS — K219 Gastro-esophageal reflux disease without esophagitis: Secondary | ICD-10-CM | POA: Diagnosis not present

## 2017-09-03 DIAGNOSIS — G589 Mononeuropathy, unspecified: Secondary | ICD-10-CM | POA: Diagnosis not present

## 2017-09-19 ENCOUNTER — Ambulatory Visit (HOSPITAL_COMMUNITY)
Admission: RE | Admit: 2017-09-19 | Discharge: 2017-09-19 | Disposition: A | Discharge status: Home / Self Care | Payer: Medicare Other | Source: Ambulatory Visit | Attending: Cardiovascular Disease | Admitting: Cardiovascular Disease

## 2017-09-19 DIAGNOSIS — I10 Essential (primary) hypertension: Secondary | ICD-10-CM | POA: Diagnosis not present

## 2017-09-19 DIAGNOSIS — I6523 Occlusion and stenosis of bilateral carotid arteries: Secondary | ICD-10-CM | POA: Diagnosis not present

## 2017-09-19 DIAGNOSIS — I63233 Cerebral infarction due to unspecified occlusion or stenosis of bilateral carotid arteries: Secondary | ICD-10-CM | POA: Diagnosis not present

## 2017-09-19 DIAGNOSIS — E782 Mixed hyperlipidemia: Secondary | ICD-10-CM | POA: Insufficient documentation

## 2017-09-19 DIAGNOSIS — I480 Paroxysmal atrial fibrillation: Secondary | ICD-10-CM | POA: Insufficient documentation

## 2017-09-23 ENCOUNTER — Telehealth: Payer: Self-pay

## 2017-09-23 DIAGNOSIS — I6522 Occlusion and stenosis of left carotid artery: Secondary | ICD-10-CM

## 2017-09-23 NOTE — Telephone Encounter (Signed)
Patient aware of carotid results. Per [name], Moderate left ICA stenosis stable 50-69% f/u duplex in a year. Patient verbalized understanding and will have repeat carotid in June 2020.

## 2017-09-23 NOTE — Telephone Encounter (Signed)
-----   Message from Josue Hector, MD sent at 09/23/2017  8:02 AM EDT ----- Moderate left ICA stenosis stable 50-69% f/u duplex in a year

## 2017-10-01 ENCOUNTER — Ambulatory Visit: Payer: Medicare Other | Admitting: Family

## 2017-10-01 ENCOUNTER — Encounter (HOSPITAL_COMMUNITY): Payer: Medicare Other

## 2017-10-16 DIAGNOSIS — G589 Mononeuropathy, unspecified: Secondary | ICD-10-CM | POA: Diagnosis not present

## 2017-10-16 DIAGNOSIS — I10 Essential (primary) hypertension: Secondary | ICD-10-CM | POA: Diagnosis not present

## 2017-10-16 DIAGNOSIS — R799 Abnormal finding of blood chemistry, unspecified: Secondary | ICD-10-CM | POA: Diagnosis not present

## 2017-10-16 DIAGNOSIS — D594 Other nonautoimmune hemolytic anemias: Secondary | ICD-10-CM | POA: Diagnosis not present

## 2017-10-16 DIAGNOSIS — L89512 Pressure ulcer of right ankle, stage 2: Secondary | ICD-10-CM | POA: Diagnosis not present

## 2017-10-16 DIAGNOSIS — Z8673 Personal history of transient ischemic attack (TIA), and cerebral infarction without residual deficits: Secondary | ICD-10-CM | POA: Diagnosis not present

## 2017-10-16 DIAGNOSIS — K219 Gastro-esophageal reflux disease without esophagitis: Secondary | ICD-10-CM | POA: Diagnosis not present

## 2017-10-16 DIAGNOSIS — E782 Mixed hyperlipidemia: Secondary | ICD-10-CM | POA: Diagnosis not present

## 2017-10-16 DIAGNOSIS — I482 Chronic atrial fibrillation: Secondary | ICD-10-CM | POA: Diagnosis not present

## 2017-10-16 DIAGNOSIS — M816 Localized osteoporosis [Lequesne]: Secondary | ICD-10-CM | POA: Diagnosis not present

## 2017-10-16 DIAGNOSIS — G894 Chronic pain syndrome: Secondary | ICD-10-CM | POA: Diagnosis not present

## 2017-10-16 DIAGNOSIS — E785 Hyperlipidemia, unspecified: Secondary | ICD-10-CM | POA: Diagnosis not present

## 2017-10-22 DIAGNOSIS — L8992 Pressure ulcer of unspecified site, stage 2: Secondary | ICD-10-CM | POA: Diagnosis not present

## 2017-10-22 DIAGNOSIS — D594 Other nonautoimmune hemolytic anemias: Secondary | ICD-10-CM | POA: Diagnosis not present

## 2017-10-22 DIAGNOSIS — K219 Gastro-esophageal reflux disease without esophagitis: Secondary | ICD-10-CM | POA: Diagnosis not present

## 2017-10-22 DIAGNOSIS — H6120 Impacted cerumen, unspecified ear: Secondary | ICD-10-CM | POA: Diagnosis not present

## 2017-10-22 DIAGNOSIS — G894 Chronic pain syndrome: Secondary | ICD-10-CM | POA: Diagnosis not present

## 2017-10-22 DIAGNOSIS — I10 Essential (primary) hypertension: Secondary | ICD-10-CM | POA: Diagnosis not present

## 2017-10-22 DIAGNOSIS — N183 Chronic kidney disease, stage 3 (moderate): Secondary | ICD-10-CM | POA: Diagnosis not present

## 2017-10-22 DIAGNOSIS — E782 Mixed hyperlipidemia: Secondary | ICD-10-CM | POA: Diagnosis not present

## 2017-10-22 DIAGNOSIS — I482 Chronic atrial fibrillation: Secondary | ICD-10-CM | POA: Diagnosis not present

## 2017-10-22 DIAGNOSIS — Z681 Body mass index (BMI) 19 or less, adult: Secondary | ICD-10-CM | POA: Diagnosis not present

## 2017-12-02 NOTE — Progress Notes (Addendum)
Patient ID: [name], female   DOB: 01/15/1941, 77 y.o.   MRN: [id number]   77 y.o. 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 LICA 40-98% duplex 04/27/12 GI bleed 02/20/15 transfused 4 units INR over 10 coumadin changed to eliquis. Had gastric and duodenal ulcers. Chronic lung disease with necrotizing pneumonia moderate COPD sees Ramaschawmy from pulmonary   She seems depressed about her inability to do things and wants to limit diagnostic testing  Still issues with falling Using rolling wheel chair now  Feels her UE tremors are worse Had seen Dr Tat 5 years ago   ROS: Denies fever, malais, weight loss, blurry vision, decreased visual acuity, cough, sputum, SOB, hemoptysis, pleuritic pain, palpitaitons, heartburn, abdominal pain, melena, lower extremity edema, claudication, or rash.  All other systems reviewed and negative  General: BP 116/62   Pulse 62   Ht 5\' 2"  (1.575 m)   Wt 116 lb (52.6 kg)   BMI 21.22 kg/m  Affect appropriate Frail thin chronically ill white female  HEENT: normal Neck supple with no adenopathy JVP normal bilateral carotid and subclavian  bruits no thyromegaly Lungs clear with no wheezing and good diaphragmatic motion Heart:  S1/S2 no murmur, no rub, gallop or click PMI normal Abdomen: benighn, BS positve, no tenderness, no AAA no bruit.  No HSM or HJR Distal pulses intact with no bruits No edema Neuro non-focal Skin bruising over arms  No muscular weakness     Current Outpatient Medications  Medication Sig Dispense Refill  . albuterol (PROVENTIL HFA;VENTOLIN HFA) 108 (90 Base) MCG/ACT inhaler Inhale 2 puffs into the lungs every 6 (six) hours as needed for wheezing or shortness of breath. 3 Inhaler 3  . Albuterol Sulfate (PROAIR RESPICLICK) 782 (90 Base) MCG/ACT AEPB Inhale 2 puffs into the lungs every 6 (six) hours as needed. 3 each 3  . amiodarone (PACERONE) 200 MG tablet TAKE 1  TABLET BY MOUTH  DAILY 90 tablet 1  . amLODipine (NORVASC) 5 MG tablet TAKE 1 TABLET BY MOUTH  DAILY 90 tablet 2  . calcium-vitamin D (OSCAL) 250-125 MG-UNIT per tablet Take 1 tablet by mouth 2 (two) times daily.      . clopidogrel (PLAVIX) 75 MG tablet TAKE 1 TABLET BY MOUTH  EVERY MORNING 90 tablet 1  . ELIQUIS 5 MG TABS tablet TAKE 1 TABLET BY MOUTH TWO  TIMES DAILY 180 tablet 3  . ferrous sulfate 325 (65 FE) MG tablet Take 325 mg by mouth daily with breakfast.    . irbesartan (AVAPRO) 150 MG tablet TAKE 1 TABLET BY MOUTH  EVERY EVENING 90 tablet 1  . magnesium oxide (MAG-OX) 400 MG tablet Take 400 mg by mouth every morning.     . metoprolol tartrate (LOPRESSOR) 25 MG tablet TAKE 3 TABLETS BY MOUTH TWO TIMES DAILY 540 tablet 1  . Multiple Vitamin (MULTIVITAMIN WITH MINERALS) TABS tablet Take 1 tablet by mouth every morning.    . naloxegol oxalate (MOVANTIK) 25 MG TABS tablet Take 25 mg by mouth daily as needed (constipation).     Marland Kitchen oxyCODONE-acetaminophen (PERCOCET/ROXICET) 5-325 MG tablet Take 1-2 tablets by mouth daily as needed for pain.    . pantoprazole (PROTONIX) 40 MG tablet Take 40 mg by mouth daily.    . pravastatin (PRAVACHOL) 20 MG tablet TAKE 1 TABLET BY MOUTH  DAILY 90 tablet 2  . Tiotropium Bromide Monohydrate (SPIRIVA RESPIMAT) 2.5 MCG/ACT AERS Inhale 2 puffs into the lungs daily.  3 Inhaler 3  . Tiotropium Bromide Monohydrate (SPIRIVA RESPIMAT) 2.5 MCG/ACT AERS Inhale 2 puffs into the lungs daily. 1 Inhaler 0  . vitamin C (ASCORBIC ACID) 500 MG tablet Take 500 mg by mouth every morning.      No current facility-administered medications for this visit.     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   Labs with primary  HTN:  Continue ARB controlled with right arm BP lower than left due to PVD Pulmonary:  Quit smoking 11 years ago Gold Class 2 COPD f/u pulmonary  Edema:  No DVT by duplex norvasc decreased stable Anemia :  History of gastric ulcers continue protonix f/u GI  Lab Results  Component Value Date   HCT 39.9 05/28/2016   CAD:  No chest pain extensive by CT last cath 2011 with no hemodynamically significant disease Continue plavix not on ASA due to GI bleed and need for eliquis for PAF  Tremor:  Refer back to Dr Tat does not appear to be parkinson's but essential tremors making it harder for her to eat. On beta blocker   F/u with me in 6 months   Jenkins Rouge

## 2017-12-10 ENCOUNTER — Ambulatory Visit (INDEPENDENT_AMBULATORY_CARE_PROVIDER_SITE_OTHER): Payer: Medicare Other | Admitting: Cardiovascular Disease

## 2017-12-10 VITALS — BP 116/62 | HR 62 | Ht 62.0 in | Wt 116.0 lb

## 2017-12-10 DIAGNOSIS — E782 Mixed hyperlipidemia: Secondary | ICD-10-CM

## 2017-12-10 DIAGNOSIS — R251 Tremor, unspecified: Secondary | ICD-10-CM

## 2017-12-10 DIAGNOSIS — I6523 Occlusion and stenosis of bilateral carotid arteries: Secondary | ICD-10-CM

## 2017-12-10 DIAGNOSIS — I10 Essential (primary) hypertension: Secondary | ICD-10-CM | POA: Diagnosis not present

## 2017-12-10 DIAGNOSIS — I480 Paroxysmal atrial fibrillation: Secondary | ICD-10-CM | POA: Diagnosis not present

## 2017-12-10 DIAGNOSIS — I6522 Occlusion and stenosis of left carotid artery: Secondary | ICD-10-CM | POA: Diagnosis not present

## 2017-12-10 NOTE — Patient Instructions (Signed)

## 2017-12-10 NOTE — Progress Notes (Addendum)
Subjective:    [name] was seen in consultation in the movement disorder clinic at the request of [name].  Her PCP is Celene Squibb, MD.  The evaluation is for tremor.  The patient is a 77 y.o. right handed female with a history of tremor.  Tremor started decades ago, per the husband (pt denies this) but her husband states that tremor picked up after "medications" were added.  When it started years ago, it started in the hands and really was very mild.  Over the last few years, hand tremor has increased and head tremor has started. Amiodarone was started 3 years ago when she had a-fib.  Her husband states that tremor got worse when medication for atrial fibrillation was added but he wasn't sure which medication it was because several were added at the same time.  There is a family hx of tremor in her son in his hands; her father also had PD which caused tremor.  She has trouble with picking up a pot full of water.  She has trouble with writing.      Affected by caffeine:  no (has 1 cup of coffee every 4-5 days and does not seem to change tremor) Affected by alcohol:  yes (but only 1-3 glasses wine per month) Affected by stress:  yes (and worse when angered) Affected by fatigue:  no Spills soup if on spoon:  yes and so drinks her soup Spills glass of liquid if full:  yes Affects ADL's (tying shoes, brushing teeth, etc):  yes (has to stabilize arm to put on makeup, brush teeth)  Current/Previously tried tremor medications: n/a  Current medications that may exacerbate tremor:  amiodarone  Outside reports reviewed: historical medical records and referral letter/letters.  12/10/17 consult: Patient is seen today for tremor.  I have not seen the patient since 2015.  Numerous records were reviewed.  This patient is accompanied in the office by her spouse who supplements the history.  At that time, it was felt that patient likely had mild essential tremor that was worsened by amiodarone.  At that  time, I told her to follow-up with [name] to see if there are any alternatives to the amiodarone.  He was able to decrease her amiodarone dose from 400 mg to 200 mg, but is still on the amiodarone.   It got better after the medication was cut back per husband but now is again having troubles writing.   Is on albuterol and uses that rarely.  On spiriva and uses that twice per day.  Just started using that bid within last 6 months but doesn't think that changed tremor.  Notes tremor most with activation.  Does note difficulty with eating/drinking/writing.  She has not had falls.  She has not had lightheadedness or near syncope.  Current medications that may exacerbate tremor:  Amiodarone, albuterol, spiriva  Allergies, past medical history, past surgical history, social history, family history were reviewed and updated.  Allergies  Allergen Reactions  . Iodine Rash and Other (See Comments)    BETADINE Rash/burning, blisters on skin.  . Iohexol Rash and Other (See Comments)    Blisters; PT NEEDS 13-HOUR PREP   . Tape Itching and Rash    Prefers PAPER TAPE    Current Outpatient Medications on File Prior to Visit  Medication Sig Dispense Refill  . albuterol (PROVENTIL HFA;VENTOLIN HFA) 108 (90 Base) MCG/ACT inhaler Inhale 2 puffs into the lungs every 6 (six) hours as needed for  wheezing or shortness of breath. 3 Inhaler 3  . Albuterol Sulfate (PROAIR RESPICLICK) 782 (90 Base) MCG/ACT AEPB Inhale 2 puffs into the lungs every 6 (six) hours as needed. 3 each 3  . amiodarone (PACERONE) 200 MG tablet TAKE 1 TABLET BY MOUTH  DAILY 90 tablet 1  . amLODipine (NORVASC) 5 MG tablet TAKE 1 TABLET BY MOUTH  DAILY 90 tablet 2  . calcium-vitamin D (OSCAL) 250-125 MG-UNIT per tablet Take 1 tablet by mouth 2 (two) times daily.      . clopidogrel (PLAVIX) 75 MG tablet TAKE 1 TABLET BY MOUTH  EVERY MORNING 90 tablet 1  . ELIQUIS 5 MG TABS tablet TAKE 1 TABLET BY MOUTH TWO  TIMES DAILY 180 tablet 3  . ferrous  sulfate 325 (65 FE) MG tablet Take 325 mg by mouth daily with breakfast.    . irbesartan (AVAPRO) 150 MG tablet TAKE 1 TABLET BY MOUTH  EVERY EVENING 90 tablet 1  . magnesium oxide (MAG-OX) 400 MG tablet Take 400 mg by mouth every morning.     . metoprolol tartrate (LOPRESSOR) 25 MG tablet TAKE 3 TABLETS BY MOUTH TWO TIMES DAILY 540 tablet 1  . Multiple Vitamin (MULTIVITAMIN WITH MINERALS) TABS tablet Take 1 tablet by mouth every morning.    . naloxegol oxalate (MOVANTIK) 25 MG TABS tablet Take 25 mg by mouth daily as needed (constipation).     Marland Kitchen oxyCODONE-acetaminophen (PERCOCET/ROXICET) 5-325 MG tablet Take 1-2 tablets by mouth daily as needed for pain.    . pantoprazole (PROTONIX) 40 MG tablet Take 40 mg by mouth daily.    . pravastatin (PRAVACHOL) 20 MG tablet TAKE 1 TABLET BY MOUTH  DAILY 90 tablet 2  . Tiotropium Bromide Monohydrate (SPIRIVA RESPIMAT) 2.5 MCG/ACT AERS Inhale 2 puffs into the lungs daily. 3 Inhaler 3  . vitamin C (ASCORBIC ACID) 500 MG tablet Take 500 mg by mouth every morning.      No current facility-administered medications on file prior to visit.     Past Medical History:  Diagnosis Date  . Anemia 05/2014.  Marland Kitchen Atrial fibrillation ([place])    x 3 yrs  . Breast cancer ([place])    S/P left mastectomy and chemotherapy 1989 remained in remission  . Carotid artery occlusion   . Carotid bruit    LICA 95-62% (duplex 1/30)  . Degenerative arthritis of spine 2015   Chronic back pain  . Hyperlipidemia   . Hypertension   . Meningioma ([place])   . Osteoporosis   . Pneumonia May 19, 2014  . Stroke [facility])    CVA 2008  . Subclavian steal syndrome   . Ulcers of both lower extremities ([place]) 2015    Past Surgical History:  Procedure Laterality Date  . CARDIAC CATHETERIZATION    . CATARACT EXTRACTION Bilateral 2013  . COLONOSCOPY  11/17/2010   Procedure: COLONOSCOPY;  Surgeon: [name], MD;  Location[facility];  Service: Endoscopy;  Laterality: N/A;  10:45 am  .  ESOPHAGOGASTRODUODENOSCOPY  11/17/2010   Procedure: ESOPHAGOGASTRODUODENOSCOPY (EGD);  Surgeon: [name], MD;  Location[facility];  Service: Endoscopy;  Laterality: N/A;  . ESOPHAGOGASTRODUODENOSCOPY N/A 02/16/2015   Procedure: ESOPHAGOGASTRODUODENOSCOPY (EGD);  Surgeon: [name], MD;  Location: [place];  Service: Gastroenterology;  Laterality: N/A;  . EXPLORATORY LAPAROTOMY  1960s   For peritonitis of undetermined cause  . EYE SURGERY    . LUMBAR EPIDURAL INJECTION  06-2012--06-2013   pt. states she has had 5 epidurals  in 06-2012----06-2013  . MASTECTOMY Left 1990  . YAG LASER APPLICATION Right 7/65/4650   Procedure: YAG LASER APPLICATION;  Surgeon: [name], MD;  Location: [facility];  Service: Ophthalmology;  Laterality: Right;  right  . YAG LASER APPLICATION Left 3/54/6568   Procedure: YAG LASER APPLICATION;  Surgeon: [name], MD;  Location: [facility];  Service: Ophthalmology;  Laterality: Left;    Social History   Socioeconomic History  . Marital status: Married    Spouse name: Not on file  . Number of children: 2  . Years of education: Not on file  . Highest education level: Not on file  Occupational History  . Occupation: Retired    Fish farm manager: RETIRED  Social Needs  . Financial resource strain: Not on file  . Food insecurity:    Worry: Not on file    Inability: Not on file  . Transportation needs:    Medical: Not on file    Non-medical: Not on file  Tobacco Use  . Smoking status: Former Smoker    Packs/day: 1.00    Years: 56.00    Pack years: 56.00    Types: Cigarettes    Last attempt to quit: 08/14/2006    Years since quitting: 11.3  . Smokeless tobacco: Never Used  . Tobacco comment: quit in 2008  Substance and Sexual Activity  . Alcohol use: No    Alcohol/week: 0.0 standard drinks  . Drug use: No  . Sexual activity: Never  Lifestyle  . Physical activity:    Days per week: Not on file    Minutes per session: Not on file  .  Stress: Not on file  Relationships  . Social connections:    Talks on phone: Not on file    Gets together: Not on file    Attends religious service: Not on file    Active member of club or organization: Not on file    Attends meetings of clubs or organizations: Not on file    Relationship status: Not on file  . Intimate partner violence:    Fear of current or ex partner: Not on file    Emotionally abused: Not on file    Physically abused: Not on file    Forced sexual activity: Not on file  Other Topics Concern  . Not on file  Social History Narrative  . Not on file    Family Status  Relation Name Status  . Mother  Deceased at age 61       Dementia  . Father  Deceased at age 52       Parkinson's Disease  . Son  Alive       good health  . Daughter  Alive       good health  . Other married Alive  . MGM  Deceased  . MGF  Deceased  . PGM  Deceased  . PGF  Deceased    Review of Systems Review of Systems  Constitutional: Negative.   HENT: Negative.   Eyes: Negative.   Respiratory: Negative.   Cardiovascular: Negative.   Gastrointestinal: Negative.   Genitourinary: Negative.   Musculoskeletal: Positive for back pain and joint pain (bilateral hip pain).  Skin: Negative.   Neurological: Positive for tremors.  Endo/Heme/Allergies: Negative.   Psychiatric/Behavioral: Negative.       Objective:   VITALS:   Vitals:   12/12/17 0839  BP: (!) 144/68  Pulse: 60  SpO2: 97%  Weight: 117 lb (53.1 kg)  Height: 5'  1.5" (1.562 m)     Gen:  Appears stated age and in NAD. HEENT:  Normocephalic, atraumatic. The mucous membranes are moist. The superficial temporal arteries are without ropiness or tenderness. Cardiovascular: Regular rate and rhythm. Lungs: Clear to auscultation bilaterally. Neck: There are no carotid bruits noted bilaterally.  NEUROLOGICAL:  Orientation:  The patient is alert and oriented x 3.  Recent and remote memory are intact.  Attention span and  concentration are normal.  Able to name objects and repeat without trouble.  Fund of knowledge is appropriate Cranial nerves: There is good facial symmetry. Fundoscopic exam is attempted but the disc margins are not well visualized bilaterally. Extraocular muscles are intact and visual fields are full to confrontational testing. Speech is fluent and clear. Soft palate rises symmetrically and there is no tongue deviation. Hearing is intact to conversational tone. Tone: Tone is good throughout. Sensation: Sensation is intact to light touch throughout (facial, trunk, extremities). Vibration is intact at the bilateral big toe. There is no extinction with double simultaneous stimulation. There is no sensory dermatomal level identified. Coordination:  The patient has no difficulty with RAM's or FNF bilaterally. Motor: Strength is 5/5 in the bilateral upper and lower extremities.  Shoulder shrug is equal bilaterally.  There is no pronator drift.  There are no fasciculations noted. DTR's: Deep tendon reflexes are 2/4 at the bilateral biceps, triceps, brachioradialis, patella.  Plantar responses are downgoing bilaterally. Gait and Station: The patient uses a transport chair to ambulate.  When she lets go of it, she is very unstable.  She has L foot drop (states from her back).      MOVEMENT EXAM: Tremor:    has head tremor in the "yes" direction (had this in 2015).  She has trouble with Archimedes spirals, specifically worse on the left.  She has postural tremor and intention tremor bilaterally.  LABS:  Lab Results  Component Value Date   TSH 1.703 05/21/2014   Addendum labs: Lab work is received from Dr. Allyn Kenner that was completed on October 16, 2017.  White blood cells were 7.9, hemoglobin 14.2, hematocrit 42.7 and platelets 323.  Sodium was 134, potassium 5.0, chloride 97, CO2 22, BUN 23 and creatinine 1.14.  AST 20, ALT 11, alkaline phosphatase 82.     Assessment/Plan:   1. Tremor.  -I did tell  the patient that she likely has essential tremor that has been exacerbated by amiodarone.  She has a fam hx of ET.  Dosage of amiodarone has decreased over the course of time, but she still has tremor and still remains on the amiodarone.  They did note an improvement in tremor when amiodarone dosage was decreased from 400 mg to 200 mg.  Asked me if this could be decreased further, but I told him this would be up to her cardiologist.   -Talked about medications for tremor, but the only one that likely would be effective is primidone and this interacts with the Eliquis.  If she is able to change to Pradaxa, then primidone could be an option.  Will ask [name] about this  -likely not a candidate for beta blocker for tremor given lung function and bradycardia  -Talk to them about readi steadi glove.  I gave them patient brochure about this.  Discussed cost associated with this.  They are going to think about that.  2.  L foot drop  -Likely due to lumbar radiculopathy.  Last imaging was a CT of the lumbar spine  in 2016 demonstrating moderately severe central canal stenosis at the T11-T12 level, multilevel spondylosis at L3-L4 and L4-L5 and severe scoliosis.  We talked about reimaging, but the patient was very clear and stated multiple times that she would not do any surgery and understands the consequences.  3.  They will call us on Monday if they do not hear back from our office by then.

## 2017-12-12 ENCOUNTER — Ambulatory Visit (INDEPENDENT_AMBULATORY_CARE_PROVIDER_SITE_OTHER): Payer: Medicare Other | Admitting: Neurology

## 2017-12-12 ENCOUNTER — Telehealth: Payer: Self-pay | Admitting: Cardiovascular Disease

## 2017-12-12 ENCOUNTER — Encounter: Payer: Self-pay | Admitting: Neurology

## 2017-12-12 ENCOUNTER — Telehealth: Payer: Self-pay | Admitting: Neurology

## 2017-12-12 VITALS — BP 144/68 | HR 60 | Ht 61.5 in | Wt 117.0 lb

## 2017-12-12 DIAGNOSIS — G251 Drug-induced tremor: Secondary | ICD-10-CM | POA: Diagnosis not present

## 2017-12-12 DIAGNOSIS — M21372 Foot drop, left foot: Secondary | ICD-10-CM

## 2017-12-12 DIAGNOSIS — G250 Essential tremor: Secondary | ICD-10-CM | POA: Diagnosis not present

## 2017-12-12 DIAGNOSIS — M5416 Radiculopathy, lumbar region: Secondary | ICD-10-CM | POA: Diagnosis not present

## 2017-12-12 DIAGNOSIS — I6523 Occlusion and stenosis of bilateral carotid arteries: Secondary | ICD-10-CM

## 2017-12-12 NOTE — Telephone Encounter (Signed)
Spoke with [name] and he said ok to change anticoagulation to pradaxa and stop amiodarone all together.  Ask the patient if she wants to start primidone?  She may not as her husband said when I was leaving that if amiodarone could be reduced (or stopped) he would like to see how she does with that first.  She definitely has baseline essential tremor so will still have some tremor even if amiodarone stopped.

## 2017-12-12 NOTE — Telephone Encounter (Signed)
New Medication   Patients husband is calling in on behalf of his wife. He advises that Dr.Nishan wanted her to see Dr. Carles Collet. They did see Dr. Carles Collet and Dr. Carles Collet gave two recommendations. The first was to cut back or stop the amiodarone. The other was to stop Eliquis, start Pradaxa and add some other type of medication for tremors. They are not really happy with either choice and they would like to discuss in detail with [name]. Please call to discuss.

## 2017-12-12 NOTE — Telephone Encounter (Signed)
Called patient's husband back about possible changes. Patient does not want to stop eliquis. Patient's husband stated that it's okay to cut back on amiodarone. Informed husband that [name] advised to stop amiodarone all together. Patient will stop amiodarone and continue on eliquis. Patient does not want to start medication that helps with tremors, due to having to change anticoagation medication and the cost of new medication.

## 2017-12-12 NOTE — Telephone Encounter (Signed)
Colleen Hector, MD  Tat, Colleen Quail, DO  Cc: Supple, Harlon Flor, [place]; Aris [place], Olin Hauser, RN        Her tremors are very bothersome to her  Ok to change anticoagulation to pradaxa and stop amiodarone all together  Copied my note to nurse and pharm D as she may need to be on lower dose due  To age and body mass

## 2017-12-12 NOTE — Telephone Encounter (Signed)
Patient's husband made aware.  He states that they didn't want to switch medication and only wanted to know about stopping or reducing amiodarone. I did let him know he would need to discuss this with Dr. Kyla Balzarine office. If they are interested in the future to start Primidone and they have stopped Amiodarone, they will reach out to [place].

## 2017-12-12 NOTE — Telephone Encounter (Signed)
Pradaxa dose would be 75mg  BID with Crcl 16ml/min.

## 2017-12-26 ENCOUNTER — Telehealth: Payer: Self-pay | Admitting: Cardiology

## 2017-12-26 ENCOUNTER — Other Ambulatory Visit: Payer: Self-pay | Admitting: *Deleted

## 2017-12-26 MED ORDER — METOPROLOL TARTRATE 25 MG PO TABS: ORAL_TABLET | ORAL | 3 refills | Status: DC

## 2017-12-26 MED ORDER — PANTOPRAZOLE SODIUM 40 MG PO TBEC: 40.0000 mg | DELAYED_RELEASE_TABLET | Freq: Every day | ORAL | 3 refills | Status: DC

## 2017-12-26 MED ORDER — PRAVASTATIN SODIUM 20 MG PO TABS: 20.0000 mg | ORAL_TABLET | Freq: Every day | ORAL | 3 refills | Status: DC

## 2017-12-26 NOTE — Telephone Encounter (Signed)
Okay to refill pantoprazole and pravastatin? I do not see where Dr Johnsie Cancel has refilled the pantoprazole and per office visit, lipids followed by pcp. Please advise. Thanks, MI

## 2017-12-26 NOTE — Telephone Encounter (Signed)
Refilled placed for pravastatin and metoprolol. Will see if [name] wants to refill pantoprazole or send request to PCP.

## 2017-12-26 NOTE — Telephone Encounter (Signed)
All have been refilled.

## 2017-12-26 NOTE — Telephone Encounter (Signed)
New Message    *STAT* If patient is at the pharmacy, call can be transferred to refill team.   1. Which medications need to be refilled? (please list name of each medication and dose if known) pravastatin (PRAVACHOL) 20 MG tablet, metoprolol tartrate (LOPRESSOR) 25 MG tablet and pantoprazole (PROTONIX) 40 MG tablet    2. Which pharmacy/location (including street and city if local pharmacy) is medication to be sent to? [place], [place]  3. Do they need a 30 day or 90 day supply? 90 day  Please use reference number [id number]

## 2017-12-26 NOTE — Telephone Encounter (Signed)
Ok to refill everything

## 2017-12-30 ENCOUNTER — Other Ambulatory Visit: Payer: Self-pay | Admitting: Cardiovascular Disease

## 2018-01-03 ENCOUNTER — Other Ambulatory Visit: Payer: Self-pay | Admitting: Cardiovascular Disease

## 2018-01-14 DIAGNOSIS — Z23 Encounter for immunization: Secondary | ICD-10-CM | POA: Diagnosis not present

## 2018-02-14 ENCOUNTER — Other Ambulatory Visit: Payer: Self-pay | Admitting: Cardiovascular Disease

## 2018-02-14 NOTE — Telephone Encounter (Signed)
Eliquis 5mg  refill request received; pt is 77 yrs old, wt-53.1kg, Crea-1.14 on 10/16/17, last seen by [facility] on 12/10/17; will send in refill to requested pharmacy.

## 2018-02-18 DIAGNOSIS — R2242 Localized swelling, mass and lump, left lower limb: Secondary | ICD-10-CM | POA: Diagnosis not present

## 2018-04-07 DIAGNOSIS — N183 Chronic kidney disease, stage 3 (moderate): Secondary | ICD-10-CM | POA: Diagnosis not present

## 2018-04-07 DIAGNOSIS — Z6820 Body mass index (BMI) 20.0-20.9, adult: Secondary | ICD-10-CM | POA: Diagnosis not present

## 2018-04-07 DIAGNOSIS — D594 Other nonautoimmune hemolytic anemias: Secondary | ICD-10-CM | POA: Diagnosis not present

## 2018-04-07 DIAGNOSIS — R799 Abnormal finding of blood chemistry, unspecified: Secondary | ICD-10-CM | POA: Diagnosis not present

## 2018-04-07 DIAGNOSIS — R944 Abnormal results of kidney function studies: Secondary | ICD-10-CM | POA: Diagnosis not present

## 2018-04-07 DIAGNOSIS — D72829 Elevated white blood cell count, unspecified: Secondary | ICD-10-CM | POA: Diagnosis not present

## 2018-04-07 DIAGNOSIS — E785 Hyperlipidemia, unspecified: Secondary | ICD-10-CM | POA: Diagnosis not present

## 2018-04-07 DIAGNOSIS — I10 Essential (primary) hypertension: Secondary | ICD-10-CM | POA: Diagnosis not present

## 2018-04-07 DIAGNOSIS — E782 Mixed hyperlipidemia: Secondary | ICD-10-CM | POA: Diagnosis not present

## 2018-04-14 DIAGNOSIS — G894 Chronic pain syndrome: Secondary | ICD-10-CM | POA: Diagnosis not present

## 2018-04-14 DIAGNOSIS — E782 Mixed hyperlipidemia: Secondary | ICD-10-CM | POA: Diagnosis not present

## 2018-04-14 DIAGNOSIS — I482 Chronic atrial fibrillation, unspecified: Secondary | ICD-10-CM | POA: Diagnosis not present

## 2018-04-14 DIAGNOSIS — L89512 Pressure ulcer of right ankle, stage 2: Secondary | ICD-10-CM | POA: Diagnosis not present

## 2018-04-14 DIAGNOSIS — K219 Gastro-esophageal reflux disease without esophagitis: Secondary | ICD-10-CM | POA: Diagnosis not present

## 2018-04-14 DIAGNOSIS — I10 Essential (primary) hypertension: Secondary | ICD-10-CM | POA: Diagnosis not present

## 2018-04-14 DIAGNOSIS — D594 Other nonautoimmune hemolytic anemias: Secondary | ICD-10-CM | POA: Diagnosis not present

## 2018-04-14 DIAGNOSIS — Z8673 Personal history of transient ischemic attack (TIA), and cerebral infarction without residual deficits: Secondary | ICD-10-CM | POA: Diagnosis not present

## 2018-04-14 DIAGNOSIS — N182 Chronic kidney disease, stage 2 (mild): Secondary | ICD-10-CM | POA: Diagnosis not present

## 2018-04-22 ENCOUNTER — Other Ambulatory Visit (HOSPITAL_COMMUNITY): Payer: Self-pay | Admitting: Internal Medicine

## 2018-04-22 DIAGNOSIS — Z1231 Encounter for screening mammogram for malignant neoplasm of breast: Secondary | ICD-10-CM

## 2018-04-22 DIAGNOSIS — Z780 Asymptomatic menopausal state: Secondary | ICD-10-CM

## 2018-04-29 ENCOUNTER — Ambulatory Visit (HOSPITAL_COMMUNITY)
Admission: RE | Admit: 2018-04-29 | Discharge: 2018-04-29 | Disposition: A | Discharge status: Home / Self Care | Payer: Medicare Other | Source: Ambulatory Visit | Attending: Internal Medicine | Admitting: Internal Medicine

## 2018-04-29 DIAGNOSIS — M810 Age-related osteoporosis without current pathological fracture: Secondary | ICD-10-CM | POA: Diagnosis not present

## 2018-04-29 DIAGNOSIS — Z780 Asymptomatic menopausal state: Secondary | ICD-10-CM | POA: Diagnosis not present

## 2018-04-30 DIAGNOSIS — M810 Age-related osteoporosis without current pathological fracture: Secondary | ICD-10-CM | POA: Diagnosis not present

## 2018-05-05 NOTE — Discharge Instructions (Signed)
Romosozumab injection What is this medicine? ROMOSOZUMAB (roe moe SOZ ue mab) increases bone formation. It is used to treat osteoporosis in women. This medicine may be used for other purposes; ask your health care provider or pharmacist if you have questions. COMMON BRAND NAME(S): EVENITY What should I tell my health care provider before I take this medicine? They need to know if you have any of these conditions: -dental disease or wear dentures -heart disease -history of stroke -kidney disease -low levels of calcium in the blood -an unusual or allergic reaction to romosozumab, other medicines, foods, dyes or preservatives -pregnant or trying to get pregnant -breast-feeding How should I use this medicine? This medicine is for injection under the skin. It is given by a healthcare professional in a hospital or clinic setting. Talk to your pediatrician about the use of this medicine in children. Special care may be needed. Overdosage: If you think you have taken too much of this medicine contact a poison control center or emergency room at once. NOTE: This medicine is only for you. Do not share this medicine with others. What if I miss a dose? Keep appointments for follow-up doses. It is important not to miss your dose. Call your doctor or healthcare professional if you are unable to keep an appointment. What may interact with this medicine? Interactions are not expected. This list may not describe all possible interactions. Give your health care provider a list of all the medicines, herbs, non-prescription drugs, or dietary supplements you use. Also tell them if you smoke, drink alcohol, or use illegal drugs. Some items may interact with your medicine. What should I watch for while using this medicine? Your condition will be monitored carefully while you are receiving this medicine. You may need blood work done while you are taking this medicine. Some people who take this medicine have  severe bone, joint, or muscle pain. This medicine may also increase your risk for jaw problems or a broken thigh bone. Tell your healthcare professional right away if you have severe pain in your jaw, bones, joints, or muscles. Tell your healthcare professional if you have any pain that does not go away or that gets worse. You should make sure you get enough calcium and vitamin D while you are taking this medicine. Discuss the foods you eat and the vitamins you take with your healthcare professional. Tell your dentist and dental surgeon that you are taking this medicine. You should not have major dental surgery while on this medicine. See your dentist to have a dental exam and fix any dental problems before starting this medicine. Take good care of your teeth while on this medicine. Make sure you see your dentist for regular follow-up appointments. What side effects may I notice from receiving this medicine? Side effects that you should report to your doctor or health care professional as soon as possible: -allergic reactions like skin rash, itching or hives, swelling of the face, lips, or tongue -bone pain -chest pain or chest tightness -jaw pain, especially after dental work -signs and symptoms of a stroke like changes in vision; confusion; trouble speaking or understanding; severe headaches; sudden numbness or weakness of the face, arm or leg; trouble walking; dizziness; loss of balance or coordination -signs and symptoms of low calcium like fast heartbeat; muscle cramps; muscle pain; pain, tingling, numbness in the hands or feet; seizures Side effects that usually do not require medical attention (report these to your doctor or health care professional if they continue  or are bothersome): -headache -joint pain -pain, redness, or irritation at site where injected -pain, tingling, numbness in the hands or feet -swelling of ankles, feet, hands -trouble sleeping This list may not describe all  possible side effects. Call your doctor for medical advice about side effects. You may report side effects to FDA at 1-800-FDA-1088. Where should I keep my medicine? This medicine is given in a hospital or clinic and will not be stored at home. NOTE: This sheet is a summary. It may not cover all possible information. If you have questions about this medicine, talk to your doctor, pharmacist, or health care provider.  2019 Elsevier/Gold Standard (2017-07-18 01:15:32)

## 2018-05-06 ENCOUNTER — Telehealth: Payer: Self-pay | Admitting: Cardiovascular Disease

## 2018-05-06 NOTE — Telephone Encounter (Signed)
Would not use seems to be associated with high risk of MI and stroke and you  Have a lot of vascular disease

## 2018-05-06 NOTE — Telephone Encounter (Signed)
New message   Per patient states needs [name] to contact Dr. Wende Neighbors [phone] in reference to bone density test. Patient is on Evenity and wants to make sure that this medication is okay to take with heart medication.  Please call to discuss.

## 2018-05-06 NOTE — Telephone Encounter (Signed)
Patient had bone density test done last Tuesday. Patient stated her bones are brittle and Dr. Nevada Crane wants patient to go on Evenity. Patient wanted to make sure it's okay with [name] for her to take Evenity, a shot,  twice a month at [facility], 1st shot is this Friday. Patient just wants Dr. Kyla Balzarine input for peace of mind.

## 2018-05-07 ENCOUNTER — Inpatient Hospital Stay (HOSPITAL_COMMUNITY)
Admission: RE | Admit: 2018-05-07 | Discharge: 2018-05-07 | Disposition: A | Discharge status: Home / Self Care | Payer: Medicare Other | Source: Ambulatory Visit

## 2018-05-07 ENCOUNTER — Encounter (HOSPITAL_COMMUNITY): Payer: Self-pay

## 2018-05-07 NOTE — Telephone Encounter (Signed)
Called patient back with Dr. Kyla Balzarine response. Patient wants to know what should she take instead, possibly fosamax or a shot that is given twice a year (patient unable to remember the name of it). Will forward to [name] for advisement.

## 2018-05-07 NOTE — Telephone Encounter (Signed)
That's up to your medical doc

## 2018-05-07 NOTE — Telephone Encounter (Signed)
Patient called to check on status of question.

## 2018-05-08 NOTE — Telephone Encounter (Signed)
Called patient back, talked to patient's husband (DPR) and informed him of Dr. Kyla Balzarine recommendations. Patient's husband stated he would let his wife know.

## 2018-05-09 ENCOUNTER — Encounter (HOSPITAL_COMMUNITY): Payer: Self-pay

## 2018-05-09 ENCOUNTER — Encounter (HOSPITAL_COMMUNITY): Admission: RE | Admit: 2018-05-09 | Payer: Medicare Other | Source: Ambulatory Visit

## 2018-05-14 ENCOUNTER — Encounter (HOSPITAL_COMMUNITY)
Admission: RE | Admit: 2018-05-14 | Discharge: 2018-05-14 | Disposition: A | Discharge status: Home / Self Care | Payer: Medicare Other | Source: Ambulatory Visit | Attending: Internal Medicine | Admitting: Internal Medicine

## 2018-05-14 ENCOUNTER — Encounter (HOSPITAL_COMMUNITY): Payer: Self-pay

## 2018-05-14 DIAGNOSIS — M810 Age-related osteoporosis without current pathological fracture: Secondary | ICD-10-CM | POA: Diagnosis not present

## 2018-05-14 MED ORDER — DENOSUMAB 60 MG/ML ~~LOC~~ SOSY
60.0000 mg | PREFILLED_SYRINGE | Freq: Once | SUBCUTANEOUS | Status: AC
  Administered 2018-05-14: 60 mg via SUBCUTANEOUS

## 2018-05-14 NOTE — Progress Notes (Signed)
Educational material provided regarding Prolia.  Verbalized understanding.  Consent signed.

## 2018-05-14 NOTE — Discharge Instructions (Signed)
Denosumab injection °What is this medicine? °DENOSUMAB (den oh sue mab) slows bone breakdown. Prolia is used to treat osteoporosis in women after menopause and in men, and in people who are taking corticosteroids for 6 months or more. Xgeva is used to treat a high calcium level due to cancer and to prevent bone fractures and other bone problems caused by multiple myeloma or cancer bone metastases. Xgeva is also used to treat giant cell tumor of the bone. °This medicine may be used for other purposes; ask your health care provider or pharmacist if you have questions. °COMMON BRAND NAME(S): Prolia, XGEVA °What should I tell my health care provider before I take this medicine? °They need to know if you have any of these conditions: °-dental disease °-having surgery or tooth extraction °-infection °-kidney disease °-low levels of calcium or Vitamin D in the blood °-malnutrition °-on hemodialysis °-skin conditions or sensitivity °-thyroid or parathyroid disease °-an unusual reaction to denosumab, other medicines, foods, dyes, or preservatives °-pregnant or trying to get pregnant °-breast-feeding °How should I use this medicine? °This medicine is for injection under the skin. It is given by a health care professional in a hospital or clinic setting. °A special MedGuide will be given to you before each treatment. Be sure to read this information carefully each time. °For Prolia, talk to your pediatrician regarding the use of this medicine in children. Special care may be needed. For Xgeva, talk to your pediatrician regarding the use of this medicine in children. While this drug may be prescribed for children as young as 13 years for selected conditions, precautions do apply. °Overdosage: If you think you have taken too much of this medicine contact a poison control center or emergency room at once. °NOTE: This medicine is only for you. Do not share this medicine with others. °What if I miss a dose? °It is important not to  miss your dose. Call your doctor or health care professional if you are unable to keep an appointment. °What may interact with this medicine? °Do not take this medicine with any of the following medications: °-other medicines containing denosumab °This medicine may also interact with the following medications: °-medicines that lower your chance of fighting infection °-steroid medicines like prednisone or cortisone °This list may not describe all possible interactions. Give your health care provider a list of all the medicines, herbs, non-prescription drugs, or dietary supplements you use. Also tell them if you smoke, drink alcohol, or use illegal drugs. Some items may interact with your medicine. °What should I watch for while using this medicine? °Visit your doctor or health care professional for regular checks on your progress. Your doctor or health care professional may order blood tests and other tests to see how you are doing. °Call your doctor or health care professional for advice if you get a fever, chills or sore throat, or other symptoms of a cold or flu. Do not treat yourself. This drug may decrease your body's ability to fight infection. Try to avoid being around people who are sick. °You should make sure you get enough calcium and vitamin D while you are taking this medicine, unless your doctor tells you not to. Discuss the foods you eat and the vitamins you take with your health care professional. °See your dentist regularly. Brush and floss your teeth as directed. Before you have any dental work done, tell your dentist you are receiving this medicine. °Do not become pregnant while taking this medicine or for 5 months   after stopping it. Talk with your doctor or health care professional about your birth control options while taking this medicine. Women should inform their doctor if they wish to become pregnant or think they might be pregnant. There is a potential for serious side effects to an unborn  child. Talk to your health care professional or pharmacist for more information. °What side effects may I notice from receiving this medicine? °Side effects that you should report to your doctor or health care professional as soon as possible: °-allergic reactions like skin rash, itching or hives, swelling of the face, lips, or tongue °-bone pain °-breathing problems °-dizziness °-jaw pain, especially after dental work °-redness, blistering, peeling of the skin °-signs and symptoms of infection like fever or chills; cough; sore throat; pain or trouble passing urine °-signs of low calcium like fast heartbeat, muscle cramps or muscle pain; pain, tingling, numbness in the hands or feet; seizures °-unusual bleeding or bruising °-unusually weak or tired °Side effects that usually do not require medical attention (report to your doctor or health care professional if they continue or are bothersome): °-constipation °-diarrhea °-headache °-joint pain °-loss of appetite °-muscle pain °-runny nose °-tiredness °-upset stomach °This list may not describe all possible side effects. Call your doctor for medical advice about side effects. You may report side effects to FDA at 1-800-FDA-1088. °Where should I keep my medicine? °This medicine is only given in a clinic, doctor's office, or other health care setting and will not be stored at home. °NOTE: This sheet is a summary. It may not cover all possible information. If you have questions about this medicine, talk to your doctor, pharmacist, or health care provider. °© 2019 Elsevier/Gold Standard (2017-08-02 16:10:44) ° °

## 2018-05-21 ENCOUNTER — Other Ambulatory Visit: Payer: Self-pay

## 2018-05-21 MED ORDER — IRBESARTAN 150 MG PO TABS: 150.0000 mg | ORAL_TABLET | Freq: Every evening | ORAL | 0 refills | Status: DC

## 2018-05-30 ENCOUNTER — Other Ambulatory Visit: Payer: Self-pay | Admitting: Cardiovascular Disease

## 2018-06-12 NOTE — Progress Notes (Deleted)
Patient ID: [name], female   DOB: [birth year], 78 y.o.   MRN: [id number]     78 y.o. 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 LICA 96-28% duplex 3/66/29 GI bleed 02/20/15 transfused 4 units INR over 10 coumadin changed to eliquis. Had gastric and duodenal ulcers. Chronic lung disease with necrotizing pneumonia moderate COPD sees Ramaschawmy from pulmonary   Still issues with falling Using rolling wheel chair now  Feels her UE tremors are worse Seen by Dr Tat 12/12/17 essential tremor made Worse by amiodarone and primidone interacts with eliquis Could change to 75 bid Of pradaxa She decided to stop amiodarone but not start new meds   ***  ROS: Denies fever, malais, weight loss, blurry vision, decreased visual acuity, cough, sputum, SOB, hemoptysis, pleuritic pain, palpitaitons, heartburn, abdominal pain, melena, lower extremity edema, claudication, or rash.  All other systems reviewed and negative  General: There were no vitals taken for this visit. Affect appropriate Frail thin chronically ill white female  HEENT: normal Neck supple with no adenopathy JVP normal bilateral carotid and subclavian  bruits no thyromegaly Lungs clear with no wheezing and good diaphragmatic motion Heart:  S1/S2 no murmur, no rub, gallop or click PMI normal Abdomen: benighn, BS positve, no tenderness, no AAA no bruit.  No HSM or HJR Distal pulses intact with no bruits No edema Neuro UE tremors left foot drop  Skin bruising over arms  No muscular weakness     Current Outpatient Medications  Medication Sig Dispense Refill  . albuterol (PROVENTIL HFA;VENTOLIN HFA) 108 (90 Base) MCG/ACT inhaler Inhale 2 puffs into the lungs every 6 (six) hours as needed for wheezing or shortness of breath. 3 Inhaler 3  . Albuterol Sulfate (PROAIR RESPICLICK) 476 (90 Base) MCG/ACT AEPB Inhale 2 puffs into the lungs every 6 (six) hours as needed. 3 each  3  . amLODipine (NORVASC) 5 MG tablet TAKE 1 TABLET BY MOUTH  DAILY 90 tablet 3  . calcium-vitamin D (OSCAL) 250-125 MG-UNIT per tablet Take 1 tablet by mouth 2 (two) times daily.      . clopidogrel (PLAVIX) 75 MG tablet TAKE 1 TABLET BY MOUTH  EVERY MORNING 90 tablet 3  . denosumab (PROLIA) 60 MG/ML SOSY injection Inject 60 mg into the skin every 6 (six) months.    Marland Kitchen ELIQUIS 5 MG TABS tablet TAKE 1 TABLET BY MOUTH TWO  TIMES DAILY 180 tablet 3  . ferrous sulfate 325 (65 FE) MG tablet Take 325 mg by mouth daily with breakfast.    . irbesartan (AVAPRO) 150 MG tablet TAKE 1 TABLET BY MOUTH EVERY EVENING. 90 tablet 1  . magnesium oxide (MAG-OX) 400 MG tablet Take 400 mg by mouth every morning.     . metoprolol tartrate (LOPRESSOR) 25 MG tablet TAKE 3 TABLETS BY MOUTH TWO TIMES DAILY 540 tablet 3  . Multiple Vitamin (MULTIVITAMIN WITH MINERALS) TABS tablet Take 1 tablet by mouth every morning.    . naloxegol oxalate (MOVANTIK) 25 MG TABS tablet Take 25 mg by mouth daily as needed (constipation).     Marland Kitchen oxyCODONE-acetaminophen (PERCOCET/ROXICET) 5-325 MG tablet Take 1-2 tablets by mouth daily as needed for pain.    . pantoprazole (PROTONIX) 40 MG tablet Take 1 tablet (40 mg total) by mouth daily. 90 tablet 3  . pravastatin (PRAVACHOL) 20 MG tablet Take 1 tablet (20 mg total) by mouth daily. 90 tablet 3  . Tiotropium Bromide Monohydrate (SPIRIVA RESPIMAT) 2.5  MCG/ACT AERS Inhale 2 puffs into the lungs daily. 3 Inhaler 3  . vitamin C (ASCORBIC ACID) 500 MG tablet Take 500 mg by mouth every morning.      No current facility-administered medications for this visit.     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  Labs with  primary  HTN:  Continue ARB controlled with right arm BP lower than left due to PVD Pulmonary:  Quit smoking 11 years ago Gold Class 2 COPD f/u pulmonary  Edema:  No DVT by duplex norvasc decreased stable Anemia :  History of gastric ulcers continue protonix f/u GI  Lab Results  Component Value Date   HCT 39.9 05/28/2016   CAD:  No chest pain extensive by CT last cath 2011 with no hemodynamically significant disease Continue plavix not on ASA due to GI bleed and need for eliquis for PAF  Tremor:  Essential hopefully will improve off amiodarone. Alternative is to stop eliquis and start primidone and pradaxa low dose   Osteoporosis:  Told her not to use Evenity as it is associated with MI and complications in patients with vascular disease   F/u with me in 6 months   Jenkins Rouge

## 2018-06-23 ENCOUNTER — Telehealth: Payer: Self-pay

## 2018-06-23 NOTE — Telephone Encounter (Signed)
Cancelled appointment for 06/24/18 with [name]. Will send to cancel pool.

## 2018-06-24 ENCOUNTER — Ambulatory Visit: Payer: Medicare Other | Admitting: Cardiovascular Disease

## 2018-07-07 NOTE — Telephone Encounter (Signed)
Anderson Malta - please schedule appt in 3 months with [name] or myself.

## 2018-08-08 DIAGNOSIS — Z8673 Personal history of transient ischemic attack (TIA), and cerebral infarction without residual deficits: Secondary | ICD-10-CM | POA: Diagnosis not present

## 2018-08-08 DIAGNOSIS — E785 Hyperlipidemia, unspecified: Secondary | ICD-10-CM | POA: Diagnosis not present

## 2018-08-08 DIAGNOSIS — E782 Mixed hyperlipidemia: Secondary | ICD-10-CM | POA: Diagnosis not present

## 2018-08-08 DIAGNOSIS — K219 Gastro-esophageal reflux disease without esophagitis: Secondary | ICD-10-CM | POA: Diagnosis not present

## 2018-08-08 DIAGNOSIS — D594 Other nonautoimmune hemolytic anemias: Secondary | ICD-10-CM | POA: Diagnosis not present

## 2018-08-08 DIAGNOSIS — I10 Essential (primary) hypertension: Secondary | ICD-10-CM | POA: Diagnosis not present

## 2018-08-27 ENCOUNTER — Telehealth: Payer: Self-pay | Admitting: Cardiovascular Disease

## 2018-08-27 NOTE — Telephone Encounter (Signed)
Follow up:    Patient returning a call back from yesterday. Did not see a note.

## 2018-08-27 NOTE — Telephone Encounter (Signed)
Not sure who called pt.

## 2018-09-02 ENCOUNTER — Telehealth: Payer: Self-pay

## 2018-09-02 DIAGNOSIS — I6523 Occlusion and stenosis of bilateral carotid arteries: Secondary | ICD-10-CM

## 2018-09-02 NOTE — Telephone Encounter (Signed)
Placed order for carotids to be done at [facility]. Will send message to scheduling to help with appt.

## 2018-09-02 NOTE — Telephone Encounter (Signed)
-----   Message from Baxter Flattery sent at 09/02/2018  2:16 PM EDT ----- She likes her carotid done @ [facility]. Can you place the orders for there and have them call to schedule I'm unable to schedule there. ----- Message ----- From: Michaelyn Barter, RN Sent: 09/02/2018   1:42 PM EDT To: Baxter Flattery   ----- Message ----- From: Michaelyn Barter, RN Sent: 08/31/2018 To: Michaelyn Barter, RN  Patient needs repeat carotids in June 2020

## 2018-09-12 ENCOUNTER — Ambulatory Visit (HOSPITAL_COMMUNITY)
Admission: RE | Admit: 2018-09-12 | Discharge: 2018-09-12 | Disposition: A | Discharge status: Home / Self Care | Payer: Medicare Other | Source: Ambulatory Visit | Attending: Cardiovascular Disease | Admitting: Cardiovascular Disease

## 2018-09-12 ENCOUNTER — Other Ambulatory Visit: Payer: Self-pay

## 2018-09-12 DIAGNOSIS — I6523 Occlusion and stenosis of bilateral carotid arteries: Secondary | ICD-10-CM | POA: Insufficient documentation

## 2018-09-12 DIAGNOSIS — I6522 Occlusion and stenosis of left carotid artery: Secondary | ICD-10-CM | POA: Diagnosis not present

## 2018-09-15 ENCOUNTER — Telehealth: Payer: Self-pay

## 2018-09-15 DIAGNOSIS — I6522 Occlusion and stenosis of left carotid artery: Secondary | ICD-10-CM

## 2018-09-15 NOTE — Telephone Encounter (Signed)
Patient aware of results. Will place order for carotid in one year.

## 2018-09-15 NOTE — Telephone Encounter (Signed)
-----   Message from Josue Hector, MD sent at 09/15/2018  7:15 AM EDT ----- Left ICA is 40-59% by our criteria f/u duplex in a year

## 2018-09-19 ENCOUNTER — Other Ambulatory Visit: Payer: Self-pay | Admitting: Internal Medicine

## 2018-09-23 ENCOUNTER — Other Ambulatory Visit: Payer: Self-pay | Admitting: Cardiovascular Disease

## 2018-09-23 MED ORDER — IRBESARTAN 150 MG PO TABS: 150.0000 mg | ORAL_TABLET | Freq: Every evening | ORAL | 0 refills | Status: DC

## 2018-09-23 MED ORDER — CLOPIDOGREL BISULFATE 75 MG PO TABS: 75.0000 mg | ORAL_TABLET | Freq: Every morning | ORAL | 0 refills | Status: DC

## 2018-09-23 MED ORDER — PANTOPRAZOLE SODIUM 40 MG PO TBEC: 40.0000 mg | DELAYED_RELEASE_TABLET | Freq: Every day | ORAL | 0 refills | Status: DC

## 2018-10-08 DIAGNOSIS — D594 Other nonautoimmune hemolytic anemias: Secondary | ICD-10-CM | POA: Diagnosis not present

## 2018-10-08 DIAGNOSIS — D72829 Elevated white blood cell count, unspecified: Secondary | ICD-10-CM | POA: Diagnosis not present

## 2018-10-08 DIAGNOSIS — I10 Essential (primary) hypertension: Secondary | ICD-10-CM | POA: Diagnosis not present

## 2018-10-08 DIAGNOSIS — E785 Hyperlipidemia, unspecified: Secondary | ICD-10-CM | POA: Diagnosis not present

## 2018-10-08 DIAGNOSIS — Z8673 Personal history of transient ischemic attack (TIA), and cerebral infarction without residual deficits: Secondary | ICD-10-CM | POA: Diagnosis not present

## 2018-10-08 DIAGNOSIS — K219 Gastro-esophageal reflux disease without esophagitis: Secondary | ICD-10-CM | POA: Diagnosis not present

## 2018-10-08 DIAGNOSIS — E782 Mixed hyperlipidemia: Secondary | ICD-10-CM | POA: Diagnosis not present

## 2018-10-13 DIAGNOSIS — E782 Mixed hyperlipidemia: Secondary | ICD-10-CM | POA: Diagnosis not present

## 2018-10-13 DIAGNOSIS — N183 Chronic kidney disease, stage 3 (moderate): Secondary | ICD-10-CM | POA: Diagnosis not present

## 2018-10-13 DIAGNOSIS — N182 Chronic kidney disease, stage 2 (mild): Secondary | ICD-10-CM | POA: Diagnosis not present

## 2018-10-13 DIAGNOSIS — E785 Hyperlipidemia, unspecified: Secondary | ICD-10-CM | POA: Diagnosis not present

## 2018-10-13 DIAGNOSIS — I10 Essential (primary) hypertension: Secondary | ICD-10-CM | POA: Diagnosis not present

## 2018-10-20 DIAGNOSIS — G894 Chronic pain syndrome: Secondary | ICD-10-CM | POA: Diagnosis not present

## 2018-10-20 DIAGNOSIS — E782 Mixed hyperlipidemia: Secondary | ICD-10-CM | POA: Diagnosis not present

## 2018-10-20 DIAGNOSIS — H6122 Impacted cerumen, left ear: Secondary | ICD-10-CM | POA: Diagnosis not present

## 2018-10-20 DIAGNOSIS — D594 Other nonautoimmune hemolytic anemias: Secondary | ICD-10-CM | POA: Diagnosis not present

## 2018-10-20 DIAGNOSIS — I482 Chronic atrial fibrillation, unspecified: Secondary | ICD-10-CM | POA: Diagnosis not present

## 2018-10-20 DIAGNOSIS — N183 Chronic kidney disease, stage 3 (moderate): Secondary | ICD-10-CM | POA: Diagnosis not present

## 2018-10-20 DIAGNOSIS — I129 Hypertensive chronic kidney disease with stage 1 through stage 4 chronic kidney disease, or unspecified chronic kidney disease: Secondary | ICD-10-CM | POA: Diagnosis not present

## 2018-10-20 DIAGNOSIS — K219 Gastro-esophageal reflux disease without esophagitis: Secondary | ICD-10-CM | POA: Diagnosis not present

## 2018-10-20 DIAGNOSIS — Z8673 Personal history of transient ischemic attack (TIA), and cerebral infarction without residual deficits: Secondary | ICD-10-CM | POA: Diagnosis not present

## 2018-11-04 NOTE — Progress Notes (Signed)
Patient ID: [name], female   DOB: 1941-02-07, 78 y.o.   MRN: [id number]     78 y.o. 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 LICA 87-86% duplex 10/13/70 by our criteria with peak velocity 2.64 m/sec and diastolic only 50 cm/sec  GI bleed 02/20/15 transfused 4 units INR over 10 coumadin changed to eliquis. Had gastric and duodenal ulcers. Chronic lung disease with necrotizing pneumonia moderate COPD sees Ramaschawmy from pulmonary   She seems depressed about her inability to do things and wants to limit diagnostic testing  Still issues with falling Using rolling wheel chair now  Feels her UE tremors are worse  Seen by Dr Tat 12/12/17 ? Decrease dose of amiodarone further Or change eliquis to pradaxa and try primidone Also noted left foot drop ? From lumbar radiculopathy  Told not to start Evinity for osteoporosis due to increased risk of MI/Stroke Dr Nevada Crane suggested Getting Prolia now   In office she was in rate controlled flutter today Asymptomatic and no indication to be aggressive with [facility] She is on anticoagulation   ROS: Denies fever, malais, weight loss, blurry vision, decreased visual acuity, cough, sputum, SOB, hemoptysis, pleuritic pain, palpitaitons, heartburn, abdominal pain, melena, lower extremity edema, claudication, or rash.  All other systems reviewed and negative   General: There were no vitals taken for this visit. Affect appropriate Frail thin chronically ill white female  HEENT: normal Neck supple with no adenopathy JVP normal bilateral carotid and subclavian  bruits no thyromegaly Lungs clear with no wheezing and good diaphragmatic motion Heart:  S1/S2 no murmur, no rub, gallop or click PMI normal Abdomen: benighn, BS positve, no tenderness, no AAA no bruit.  No HSM or HJR Distal pulses intact with no bruits No edema Neuro UE tremors left foot drop  Skin bruising over arms  No muscular weakness     Current Outpatient Medications  Medication Sig Dispense Refill  . albuterol (PROVENTIL HFA;VENTOLIN HFA) 108 (90 Base) MCG/ACT inhaler Inhale 2 puffs into the lungs every 6 (six) hours as needed for wheezing or shortness of breath. 3 Inhaler 3  . Albuterol Sulfate (PROAIR RESPICLICK) 094 (90 Base) MCG/ACT AEPB Inhale 2 puffs into the lungs every 6 (six) hours as needed. 3 each 3  . amLODipine (NORVASC) 5 MG tablet TAKE 1 TABLET BY MOUTH  DAILY 90 tablet 3  . calcium-vitamin D (OSCAL) 250-125 MG-UNIT per tablet Take 1 tablet by mouth 2 (two) times daily.      . clopidogrel (PLAVIX) 75 MG tablet Take 1 tablet (75 mg total) by mouth every morning. Please keep upcoming appt with [name] in July for future refills. Thank you 90 tablet 0  . denosumab (PROLIA) 60 MG/ML SOSY injection Inject 60 mg into the skin every 6 (six) months.    Marland Kitchen ELIQUIS 5 MG TABS tablet TAKE 1 TABLET BY MOUTH TWO  TIMES DAILY 180 tablet 3  . ferrous sulfate 325 (65 FE) MG tablet Take 325 mg by mouth daily with breakfast.    . irbesartan (AVAPRO) 150 MG tablet Take 1 tablet (150 mg total) by mouth every evening. Please keep upcoming appt with [name] in July for future refills. Thank you 90 tablet 0  . magnesium oxide (MAG-OX) 400 MG tablet Take 400 mg by mouth every morning.     . metoprolol tartrate (LOPRESSOR) 25 MG tablet TAKE 3 TABLETS BY MOUTH TWO TIMES DAILY 540 tablet 3  . Multiple  Vitamin (MULTIVITAMIN WITH MINERALS) TABS tablet Take 1 tablet by mouth every morning.    . naloxegol oxalate (MOVANTIK) 25 MG TABS tablet Take 25 mg by mouth daily as needed (constipation).     Marland Kitchen oxyCODONE-acetaminophen (PERCOCET/ROXICET) 5-325 MG tablet Take 1-2 tablets by mouth daily as needed for pain.    . pantoprazole (PROTONIX) 40 MG tablet Take 1 tablet (40 mg total) by mouth daily. Please keep upcoming appt with [name] in July for future refills. Thank you 90 tablet 0  . pravastatin (PRAVACHOL) 20 MG tablet Take 1 tablet  (20 mg total) by mouth daily. 90 tablet 3  . SPIRIVA RESPIMAT 2.5 MCG/ACT AERS USE 2 PUFFS DAILY 12 g 0  . vitamin C (ASCORBIC ACID) 500 MG tablet Take 500 mg by mouth every morning.      No current facility-administered medications for this visit.     Allergies  Iodine, Iohexol, and Tape  Electrocardiogram:  05/21/14  SR PR 276  LVH  06/03/17  SR rate 57 PR 308 msec   Assessment and Plan Vascular: right innominate occlusion with moderate LICA stenosis f/u duplex June 2021 at [facility] PAF:  Continue eliquis lower to 2.5 bid if any further bleeding issues  Chol:  On statin.  Labs with primary  HTN:  Continue ARB controlled with right arm BP lower than left due to PVD Pulmonary:  Quit smoking 11 years ago Gold Class 2 COPD f/u pulmonary  Edema:  No DVT by duplex norvasc decreased stable Anemia :  History of gastric ulcers continue protonix f/u GI  Lab Results  Component Value Date   HCT 39.9 05/28/2016   CAD:  No chest pain extensive by CT last cath 2011 with no hemodynamically significant disease Continue plavix not on ASA due to GI bleed and need for eliquis for PAF  Tremor:  Discussed changing from eliquis to pradaxa and starting primidone Amiodarone has been d/c She feels tremors are better and does not want to change her meds   F/u with me in 6 months   Jenkins Rouge

## 2018-11-06 ENCOUNTER — Ambulatory Visit (INDEPENDENT_AMBULATORY_CARE_PROVIDER_SITE_OTHER): Payer: Medicare Other | Admitting: Cardiovascular Disease

## 2018-11-06 ENCOUNTER — Other Ambulatory Visit: Payer: Self-pay

## 2018-11-06 ENCOUNTER — Encounter: Payer: Self-pay | Admitting: Cardiovascular Disease

## 2018-11-06 VITALS — BP 130/82 | HR 95 | Ht 61.5 in | Wt 122.0 lb

## 2018-11-06 DIAGNOSIS — E782 Mixed hyperlipidemia: Secondary | ICD-10-CM | POA: Diagnosis not present

## 2018-11-06 DIAGNOSIS — I480 Paroxysmal atrial fibrillation: Secondary | ICD-10-CM | POA: Diagnosis not present

## 2018-11-06 DIAGNOSIS — I6523 Occlusion and stenosis of bilateral carotid arteries: Secondary | ICD-10-CM | POA: Diagnosis not present

## 2018-11-06 DIAGNOSIS — I10 Essential (primary) hypertension: Secondary | ICD-10-CM | POA: Diagnosis not present

## 2018-11-06 NOTE — Patient Instructions (Signed)
Medication Instructions:  Your physician recommends that you continue on your current medications as directed. Please refer to the Current Medication list given to you today.  If you need a refill on your cardiac medications before your next appointment, please call your pharmacy.   Lab work: None Ordered   Testing/Procedures: None Ordered   Follow-Up: At [facility], you and your health needs are our priority.  As part of our continuing mission to provide you with exceptional heart care, we have created designated Provider Care Teams.  These Care Teams include your primary Cardiologist (physician) and Advanced Practice Providers (APPs -  Physician Assistants and Nurse Practitioners) who all work together to provide you with the care you need, when you need it. You will need a follow up appointment in 6 months.  Please call our office 2 months in advance to schedule this appointment.  You may see Jenkins Rouge, MD or one of the following Advanced Practice Providers on your designated Care Team:   Truitt Merle, NP Cecilie Kicks, NP . Kathyrn Drown, NP

## 2018-11-07 ENCOUNTER — Other Ambulatory Visit: Payer: Self-pay | Admitting: Internal Medicine

## 2018-11-07 DIAGNOSIS — S91002A Unspecified open wound, left ankle, initial encounter: Secondary | ICD-10-CM | POA: Diagnosis not present

## 2018-11-07 DIAGNOSIS — L03818 Cellulitis of other sites: Secondary | ICD-10-CM | POA: Diagnosis not present

## 2018-11-12 ENCOUNTER — Encounter (HOSPITAL_COMMUNITY): Payer: Self-pay

## 2018-11-12 ENCOUNTER — Other Ambulatory Visit: Payer: Self-pay

## 2018-11-12 ENCOUNTER — Encounter (HOSPITAL_COMMUNITY)
Admission: RE | Admit: 2018-11-12 | Discharge: 2018-11-12 | Disposition: A | Discharge status: Home / Self Care | Payer: Medicare Other | Source: Ambulatory Visit | Attending: Internal Medicine | Admitting: Internal Medicine

## 2018-11-12 DIAGNOSIS — M810 Age-related osteoporosis without current pathological fracture: Secondary | ICD-10-CM | POA: Diagnosis not present

## 2018-11-12 DIAGNOSIS — L89522 Pressure ulcer of left ankle, stage 2: Secondary | ICD-10-CM | POA: Diagnosis not present

## 2018-11-12 DIAGNOSIS — S91002D Unspecified open wound, left ankle, subsequent encounter: Secondary | ICD-10-CM | POA: Diagnosis not present

## 2018-11-12 MED ORDER — DENOSUMAB 60 MG/ML ~~LOC~~ SOSY
60.0000 mg | PREFILLED_SYRINGE | Freq: Once | SUBCUTANEOUS | Status: AC
  Administered 2018-11-12: 11:00:00 60 mg via SUBCUTANEOUS

## 2018-11-13 ENCOUNTER — Other Ambulatory Visit: Payer: Self-pay | Admitting: Cardiovascular Disease

## 2018-11-17 DIAGNOSIS — S91002D Unspecified open wound, left ankle, subsequent encounter: Secondary | ICD-10-CM | POA: Diagnosis not present

## 2018-11-17 DIAGNOSIS — L89522 Pressure ulcer of left ankle, stage 2: Secondary | ICD-10-CM | POA: Diagnosis not present

## 2018-11-20 ENCOUNTER — Other Ambulatory Visit: Payer: Self-pay | Admitting: Cardiovascular Disease

## 2018-11-21 DIAGNOSIS — L89522 Pressure ulcer of left ankle, stage 2: Secondary | ICD-10-CM | POA: Diagnosis not present

## 2018-11-21 DIAGNOSIS — S91002D Unspecified open wound, left ankle, subsequent encounter: Secondary | ICD-10-CM | POA: Diagnosis not present

## 2018-11-28 DIAGNOSIS — S91002D Unspecified open wound, left ankle, subsequent encounter: Secondary | ICD-10-CM | POA: Diagnosis not present

## 2018-11-28 DIAGNOSIS — L89522 Pressure ulcer of left ankle, stage 2: Secondary | ICD-10-CM | POA: Diagnosis not present

## 2018-12-02 ENCOUNTER — Other Ambulatory Visit: Payer: Self-pay | Admitting: Cardiovascular Disease

## 2018-12-08 DIAGNOSIS — S91002D Unspecified open wound, left ankle, subsequent encounter: Secondary | ICD-10-CM | POA: Diagnosis not present

## 2018-12-08 DIAGNOSIS — L89522 Pressure ulcer of left ankle, stage 2: Secondary | ICD-10-CM | POA: Diagnosis not present

## 2018-12-08 DIAGNOSIS — D594 Other nonautoimmune hemolytic anemias: Secondary | ICD-10-CM | POA: Diagnosis not present

## 2018-12-08 DIAGNOSIS — K219 Gastro-esophageal reflux disease without esophagitis: Secondary | ICD-10-CM | POA: Diagnosis not present

## 2018-12-08 DIAGNOSIS — E785 Hyperlipidemia, unspecified: Secondary | ICD-10-CM | POA: Diagnosis not present

## 2018-12-08 DIAGNOSIS — Z8673 Personal history of transient ischemic attack (TIA), and cerebral infarction without residual deficits: Secondary | ICD-10-CM | POA: Diagnosis not present

## 2018-12-08 DIAGNOSIS — E782 Mixed hyperlipidemia: Secondary | ICD-10-CM | POA: Diagnosis not present

## 2018-12-08 DIAGNOSIS — D72829 Elevated white blood cell count, unspecified: Secondary | ICD-10-CM | POA: Diagnosis not present

## 2018-12-08 DIAGNOSIS — I10 Essential (primary) hypertension: Secondary | ICD-10-CM | POA: Diagnosis not present

## 2018-12-11 ENCOUNTER — Ambulatory Visit: Payer: Medicare Other | Admitting: Primary Care

## 2018-12-17 DIAGNOSIS — L89522 Pressure ulcer of left ankle, stage 2: Secondary | ICD-10-CM | POA: Diagnosis not present

## 2018-12-17 DIAGNOSIS — Z23 Encounter for immunization: Secondary | ICD-10-CM | POA: Diagnosis not present

## 2018-12-17 DIAGNOSIS — S91002D Unspecified open wound, left ankle, subsequent encounter: Secondary | ICD-10-CM | POA: Diagnosis not present

## 2018-12-19 ENCOUNTER — Other Ambulatory Visit: Payer: Self-pay

## 2018-12-19 ENCOUNTER — Ambulatory Visit (INDEPENDENT_AMBULATORY_CARE_PROVIDER_SITE_OTHER): Payer: Medicare Other | Admitting: Primary Care

## 2018-12-19 ENCOUNTER — Encounter: Payer: Self-pay | Admitting: Primary Care

## 2018-12-19 ENCOUNTER — Ambulatory Visit (INDEPENDENT_AMBULATORY_CARE_PROVIDER_SITE_OTHER): Payer: Medicare Other

## 2018-12-19 VITALS — BP 128/64 | HR 104 | Temp 97.6°F | Ht 63.0 in | Wt 120.8 lb

## 2018-12-19 DIAGNOSIS — J449 Chronic obstructive pulmonary disease, unspecified: Secondary | ICD-10-CM | POA: Diagnosis not present

## 2018-12-19 DIAGNOSIS — J984 Other disorders of lung: Secondary | ICD-10-CM | POA: Diagnosis not present

## 2018-12-19 DIAGNOSIS — J189 Pneumonia, unspecified organism: Secondary | ICD-10-CM | POA: Diagnosis not present

## 2018-12-19 DIAGNOSIS — R918 Other nonspecific abnormal finding of lung field: Secondary | ICD-10-CM | POA: Diagnosis not present

## 2018-12-19 MED ORDER — SPIRIVA RESPIMAT 2.5 MCG/ACT IN AERS: INHALATION_SPRAY | RESPIRATORY_TRACT | 3 refills | Status: DC

## 2018-12-19 NOTE — Assessment & Plan Note (Addendum)
-   Stable interval - No shortness of breath, wheezing or cough  - Continue Spiriva respimat 2.41mcg daily - CXR today d/t rales on exam  - Monitor for increased dyspnea, mucus productive or fever - Up to date with influenza vaccine  - FU in 1 year with Dr. Chase Caller or sooner if needed

## 2018-12-19 NOTE — Progress Notes (Signed)
@Patient  ID: [name], female    DOB: 10/30/1940, 79 y.o.   MRN: [id number]  Chief Complaint  Patient presents with  . Follow-up    1 year f/up - no complaints today    Referring provider: Celene Squibb, MD  HPI: 78 year old female, former smoker quit in 2008 (56-pack-year history).  Past medical history significant for moderate COPD GOLD II, recurrent pneumonia with cavitary lesion of lung, right lower lobe lung mass, hypertension, stroke, neuromuscular disability, carotid artery disease, A. fib, diastolic heart failure, GI bleed, protein calorie malnutrition.  Patient of Dr. Chase Caller, last seen April 2019. Reports some exertional dyspnea. Non-compliant at times with Spiriva.   12/19/2018  Patient presents today for a one-year follow-up. She is doing very well with no acute complaints. Breathing is stable. Reports compliance with Spiriva 2 puffs daily. She has not required rescue inhaler. Ambulating with rolling walker/wheelchair. States that she does well moving around, her last fall was 1 year ago. She is scattered bruising/thin skin to her upper extremities. Denies shortness of breath, cough/mucus, wheezing or chest pain.  Received influenza vaccine last week.   CAT COPD Symptom & Quality of Life Score (GSK trademark) 0 is no burden. 5 is highest burden 07/11/2017  12/19/2018   Never Cough -> Cough all the time 0 0  No phlegm in chest -> Chest is full of phlegm 0 0  No chest tightness -> Chest feels very tight 0 0  No dyspnea for 1 flight stairs/hill -> Very dyspneic for 1 flight of stairs 3 1  No limitations for ADL at home -> Very limited with ADL at home 5 3  Confident leaving home -> Not at all confident leaving home 0 0  Sleep soundly -> Do not sleep soundly because of lung condition 0 0  Lots of Energy -> No energy at all 3 1  TOTAL Score (max 40)  11 5    Pulmonary function test : PFTs (06/2015)- FEV1 1.44 L/70%. No post bronchodilator response. FVC 2.1 L/76% and a  ratio 69 consistent with Gold stage II COPD. Total lung capacity is 98%. DLCO is reduced at 11.33/46%  Imaging:  CT chest (05/2014) Masslike area of consolidation RLL, r hilar and mediastinal LAN CT chest (06/2014) Significant improvement of RLL opacification, cavitary lesion persists suggestive of resolving PNA with necrosis.  CT chest (09/2014) Near complete collapse of the right lower lobe cavitary lesion with residual pleural-parenchymal thickening. New fine ground-glass nodules in the right middle lobe consistent with pulmonary infection. CT chest (02/15/2015) Recurrent to rounded masslike area of consolidation in the right lower lobe. Given that this area was present previously then resolved and has recurrent quickly, this is most compatible with rounded pneumonia. Small right pleural effusion with adjacent right lower lobe atelectasis or consolidation. Mildly enlarged precarinal lymph node, likely reactive. CT chest 04/13/15 >Interval improvement in masslike consolidation in the right lower lobe, without complete resolution, favoring resolving pneumonia. Locule of air at the lateral base of the right hemi thorax may be within necrotic lung. Bronchopleural fistula cannot be definitively Excluded. Small, partially loculated right pleural effusion. 4 mm left lower lobe nodule, stable. CT Chest (07/2015)- Post infectious scarring in the right middle and right lower lobes   Allergies  Allergen Reactions  . Iodine Rash and Other (See Comments)    BETADINE Rash/burning, blisters on skin. Burns skin  . Iohexol Rash and Other (See Comments)    Blisters; PT NEEDS 13-HOUR PREP   .  Povidone-Iodine Other (See Comments)    Burns skin  . Tape Itching, Rash and Other (See Comments)    Prefers PAPER TAPE Burns skin    Immunization History  Administered Date(s) Administered  . Influenza Split 01/07/2014  . Influenza, High Dose Seasonal PF 01/28/2016, 01/07/2017, 12/17/2018  . Influenza,inj,Quad  PF,6+ Mos 01/12/2015  . Influenza-Unspecified 01/07/2013  . Pneumococcal Conjugate-13 01/07/2014  . Pneumococcal Polysaccharide-23 01/07/2017  . Tdap 08/01/2011  . Zoster Recombinat (Shingrix) 01/10/2017    Past Medical History:  Diagnosis Date  . Anemia 05/2014.  Marland Kitchen Atrial fibrillation ([place])    x 3 yrs  . Breast cancer ([place])    S/P left mastectomy and chemotherapy 1989 remained in remission  . Carotid artery occlusion   . Carotid bruit    LICA 82-42% (duplex 3/53)  . Degenerative arthritis of spine 2015   Chronic back pain  . Hyperlipidemia   . Hypertension   . Meningioma ([place])   . Osteoporosis   . Pneumonia May 19, 2014  . Stroke [facility])    CVA 2008  . Subclavian steal syndrome   . Ulcers of both lower extremities ([place]) 2015    Tobacco History: Social History   Tobacco Use  Smoking Status Former Smoker  . Packs/day: 1.00  . Years: 56.00  . Pack years: 56.00  . Types: Cigarettes  . Quit date: 08/14/2006  . Years since quitting: 12.3  Smokeless Tobacco Never Used  Tobacco Comment   quit in 2008   Counseling given: Not Answered Comment: quit in 2008   Outpatient Medications Prior to Visit  Medication Sig Dispense Refill  . amLODipine (NORVASC) 5 MG tablet TAKE 1 TABLET BY MOUTH  DAILY 90 tablet 3  . calcium-vitamin D (OSCAL) 250-125 MG-UNIT per tablet Take 1 tablet by mouth 2 (two) times daily.      . clopidogrel (PLAVIX) 75 MG tablet Take 1 tablet (75 mg total) by mouth every morning. Please keep upcoming appt with [name] in July for future refills. Thank you 90 tablet 0  . denosumab (PROLIA) 60 MG/ML SOSY injection Inject 60 mg into the skin every 6 (six) months.    Marland Kitchen ELIQUIS 5 MG TABS tablet TAKE 1 TABLET BY MOUTH TWO  TIMES DAILY 180 tablet 3  . ferrous sulfate 325 (65 FE) MG tablet Take 325 mg by mouth daily with breakfast.    . furosemide (LASIX) 20 MG tablet Take 20 mg by mouth as needed. Only as needed PRN    . irbesartan (AVAPRO) 150 MG tablet  TAKE 1 TABLET BY MOUTH  EVERY EVENING. PLEASE KEEP  UPCOMING APPT. WITH DR.  Johnsie Cancel IN JULY FOR FUTURE  REFILLS 90 tablet 1  . magnesium oxide (MAG-OX) 400 MG tablet Take 400 mg by mouth every morning.     . metoprolol tartrate (LOPRESSOR) 25 MG tablet TAKE 3 TABLETS BY MOUTH TWO TIMES DAILY 540 tablet 3  . Multiple Vitamin (MULTIVITAMIN WITH MINERALS) TABS tablet Take 1 tablet by mouth every morning.    . naloxegol oxalate (MOVANTIK) 25 MG TABS tablet Take 25 mg by mouth daily as needed (constipation).     Marland Kitchen oxyCODONE-acetaminophen (PERCOCET/ROXICET) 5-325 MG tablet Take 1-2 tablets by mouth daily as needed for pain.    . pantoprazole (PROTONIX) 40 MG tablet Take 1 tablet (40 mg total) by mouth daily. Please keep upcoming appt with [name] in July for future refills. Thank you 90 tablet 0  . pravastatin (PRAVACHOL) 20 MG tablet Take 1 tablet (  20 mg total) by mouth daily. 90 tablet 3  . vitamin C (ASCORBIC ACID) 500 MG tablet Take 500 mg by mouth every morning.     Marland Kitchen SPIRIVA RESPIMAT 2.5 MCG/ACT AERS USE 2 INHALATIONS BY MOUTH  DAILY 12 g 3   No facility-administered medications prior to visit.    Review of Systems  Review of Systems  Constitutional: Negative.   HENT: Negative.   Respiratory: Negative for cough, shortness of breath and wheezing.   Cardiovascular: Negative.    Physical Exam  BP 128/64 (BP Location: Left Arm, Patient Position: Sitting, Cuff Size: Normal)   Pulse (!) 104   Temp 97.6 F (36.4 C)   Ht 5\' 3"  (1.6 m)   Wt 120 lb 12.8 oz (54.8 kg)   SpO2 100%   BMI 21.40 kg/m  Physical Exam Constitutional:      Comments: Thin/frail elderly female, well appearing. Pleasant.   HENT:     Head: Normocephalic and atraumatic.     Nose: Nose normal.  Eyes:     Pupils: Pupils are equal, round, and reactive to light.  Cardiovascular:     Rate and Rhythm: Regular rhythm. Tachycardia present.  Pulmonary:     Effort: Pulmonary effort is normal. No respiratory distress.      Breath sounds: Rales present. No wheezing.  Musculoskeletal:     Comments: Ambulates with rolling walker/wheelchair   Skin:    Findings: Bruising present.  Neurological:     General: No focal deficit present.     Mental Status: She is alert. Mental status is at baseline.  Psychiatric:        Mood and Affect: Mood normal.        Behavior: Behavior normal.        Thought Content: Thought content normal.        Judgment: Judgment normal.      Lab Results:  CBC    Component Value Date/Time   WBC 11.2 (H) 01/19/2016 0739   RBC 4.14 01/19/2016 0739   HGB 13.1 05/28/2016 0834   HCT 39.9 05/28/2016 0834   PLT 362 01/19/2016 0739   MCV 97.8 01/19/2016 0739   MCH 31.9 01/19/2016 0739   MCHC 32.6 01/19/2016 0739   RDW 14.1 01/19/2016 0739   LYMPHSABS 1.8 06/08/2015 0900   MONOABS 2.0 (H) 06/08/2015 0900   EOSABS 0.0 06/08/2015 0900   BASOSABS 0.0 06/08/2015 0900    BMET    Component Value Date/Time   NA 135 03/14/2015 1230   NA 136 (A) 08/11/2010   K 3.9 03/14/2015 1230   CL 101 03/14/2015 1230   CO2 23 03/14/2015 1230   GLUCOSE 111 (H) 03/14/2015 1230   BUN 16 03/14/2015 1230   BUN 21 08/11/2010   CREATININE 0.57 (L) 03/14/2015 1230   CALCIUM 8.5 (L) 03/14/2015 1230   GFRNONAA >60 02/20/2015 0418   GFRAA >60 02/20/2015 0418    BNP No results found for: BNP  ProBNP    Component Value Date/Time   PROBNP 533.0 (H) 07/26/2010 0350    Imaging: No results found.   Assessment & Plan:   COPD, moderate (HCC) - Stable interval - No shortness of breath, wheezing or cough  - Continue Spiriva respimat 2.56mcg daily - CXR today d/t rales on exam  - Monitor for increased dyspnea, mucus productive or fever - Up to date with influenza vaccine  - FU in 1 year with Dr. Chase Caller or sooner if needed  Pneumonia with cavity of lung -  No active symptoms  - CT Chest (07/2015)- Post infectious scarring in the right middle and right lower lobes    Martyn Ehrich, NP  12/19/2018

## 2018-12-19 NOTE — Patient Instructions (Addendum)
It was a pleasure meeting you today, I am glad that you are doing well  Continue Spiriva 2 puffs daily (refill sent)  CXR today for follow-up   Follow-up in 1 year with Dr. Chase Caller Or sooner if you develop worsening shortness of breath, cough, chest pain or fever

## 2018-12-19 NOTE — Assessment & Plan Note (Signed)
-   No active symptoms  - CT Chest (07/2015)- Post infectious scarring in the right middle and right lower lobes

## 2018-12-24 DIAGNOSIS — D594 Other nonautoimmune hemolytic anemias: Secondary | ICD-10-CM | POA: Diagnosis not present

## 2018-12-24 DIAGNOSIS — K219 Gastro-esophageal reflux disease without esophagitis: Secondary | ICD-10-CM | POA: Diagnosis not present

## 2018-12-24 DIAGNOSIS — D72829 Elevated white blood cell count, unspecified: Secondary | ICD-10-CM | POA: Diagnosis not present

## 2018-12-24 DIAGNOSIS — E785 Hyperlipidemia, unspecified: Secondary | ICD-10-CM | POA: Diagnosis not present

## 2018-12-24 DIAGNOSIS — Z8673 Personal history of transient ischemic attack (TIA), and cerebral infarction without residual deficits: Secondary | ICD-10-CM | POA: Diagnosis not present

## 2018-12-24 DIAGNOSIS — E782 Mixed hyperlipidemia: Secondary | ICD-10-CM | POA: Diagnosis not present

## 2018-12-24 DIAGNOSIS — I10 Essential (primary) hypertension: Secondary | ICD-10-CM | POA: Diagnosis not present

## 2018-12-25 ENCOUNTER — Other Ambulatory Visit: Payer: Self-pay | Admitting: Cardiovascular Disease

## 2018-12-26 MED ORDER — APIXABAN 2.5 MG PO TABS: 2.5000 mg | ORAL_TABLET | Freq: Two times a day (BID) | ORAL | 1 refills | Status: DC

## 2018-12-26 NOTE — Telephone Encounter (Signed)
Pt last saw Dr Johnsie Cancel on 11/06/18, last labs 10/13/18 Creat 0.92, age 78, weight 54.8kg, based on specified criteria pt is not on appropriate dosage of Eliquis.  Given pt's age of 13 now and weight of 54.8kg at last OV on 12/19/18 pt should be on 2.5mg  Eliquis BID.  Will forward message to Dr Johnsie Cancel to see if dose reduction is appropriate. Will await his response.

## 2018-12-26 NOTE — Telephone Encounter (Signed)
-----   Message from Josue Hector, MD sent at 12/26/2018  2:23 PM EDT ----- Ok to lower dose to 2.5 mg bid ----- Message ----- From: Brynda Peon, RN Sent: 12/26/2018   2:03 PM EDT To: Josue Hector, MD  Pt last saw Dr Johnsie Cancel on 11/06/18, last labs 10/13/18 Creat 0.92, age 78, weight 54.8kg, based on specified criteria pt is not on appropriate dosage of Eliquis.  Given pt's age of 57 now and weight of 54.8kg at last OV on 12/19/18 pt should be on 2.5mg  Eliquis BID.  Will forward message to Dr Johnsie Cancel to see if dose reduction is appropriate. Will await his response.

## 2018-12-26 NOTE — Telephone Encounter (Signed)
Called spoke with pt's husband, made aware of Eliquis dosage change given pt's age 78 and weight 54.8kg.  Per Dr Johnsie Cancel ok to reduce dosage to 2.5mg  BID.  90 day rx sent to [facility] order pharmacy.

## 2018-12-31 DIAGNOSIS — S91002D Unspecified open wound, left ankle, subsequent encounter: Secondary | ICD-10-CM | POA: Diagnosis not present

## 2018-12-31 DIAGNOSIS — L89512 Pressure ulcer of right ankle, stage 2: Secondary | ICD-10-CM | POA: Diagnosis not present

## 2018-12-31 DIAGNOSIS — I10 Essential (primary) hypertension: Secondary | ICD-10-CM | POA: Diagnosis not present

## 2018-12-31 DIAGNOSIS — M816 Localized osteoporosis [Lequesne]: Secondary | ICD-10-CM | POA: Diagnosis not present

## 2018-12-31 DIAGNOSIS — K219 Gastro-esophageal reflux disease without esophagitis: Secondary | ICD-10-CM | POA: Diagnosis not present

## 2018-12-31 DIAGNOSIS — Z8673 Personal history of transient ischemic attack (TIA), and cerebral infarction without residual deficits: Secondary | ICD-10-CM | POA: Diagnosis not present

## 2018-12-31 DIAGNOSIS — D594 Other nonautoimmune hemolytic anemias: Secondary | ICD-10-CM | POA: Diagnosis not present

## 2018-12-31 DIAGNOSIS — G894 Chronic pain syndrome: Secondary | ICD-10-CM | POA: Diagnosis not present

## 2018-12-31 DIAGNOSIS — R799 Abnormal finding of blood chemistry, unspecified: Secondary | ICD-10-CM | POA: Diagnosis not present

## 2018-12-31 DIAGNOSIS — E785 Hyperlipidemia, unspecified: Secondary | ICD-10-CM | POA: Diagnosis not present

## 2018-12-31 DIAGNOSIS — L89523 Pressure ulcer of left ankle, stage 3: Secondary | ICD-10-CM | POA: Diagnosis not present

## 2018-12-31 DIAGNOSIS — E782 Mixed hyperlipidemia: Secondary | ICD-10-CM | POA: Diagnosis not present

## 2018-12-31 DIAGNOSIS — I482 Chronic atrial fibrillation, unspecified: Secondary | ICD-10-CM | POA: Diagnosis not present

## 2018-12-31 DIAGNOSIS — G589 Mononeuropathy, unspecified: Secondary | ICD-10-CM | POA: Diagnosis not present

## 2019-01-07 DIAGNOSIS — L89523 Pressure ulcer of left ankle, stage 3: Secondary | ICD-10-CM | POA: Diagnosis not present

## 2019-01-07 DIAGNOSIS — S91002D Unspecified open wound, left ankle, subsequent encounter: Secondary | ICD-10-CM | POA: Diagnosis not present

## 2019-01-12 ENCOUNTER — Telehealth: Payer: Self-pay | Admitting: Cardiovascular Disease

## 2019-01-12 NOTE — Telephone Encounter (Signed)
Spoke to patient husband. Advised that patient could continue to take the 5mg  twice a day until she ran out of the medication and then switch to the 2.5mg  BID. Advised that tablet could be cut but I would not advice it as the tablets are not scored and the dose may be inaccurate.  Patient husband states he doesn't understand why the 5mg  and the 2.5mg  tablets cost the same amount. States that the 2.5mg  tablets should be half the price. Complained about the cost of Eliquis and how it makes her go into the donut whole. Offered to send an application for the patient assistance program, but he was not interested. I advised that I cannot send an Rx for 5mg  tablets as if I sent in for 5mg  BID and advised her to take 2.5mg  BID that would be insurance fraud. He disagreed with this statement. It is also unsafe to cut tablets long term.  I stated that if he was unhappy with the cost of the medication that she could switch to warfarin but he stated that she tried that her number was never right. Husband stated that he "wasn't mad at me, you don't make the rules" and was appreciative of the call.

## 2019-01-12 NOTE — Telephone Encounter (Signed)
New Message  Pt c/o medication issue:  1. Name of Medication: apixaban (ELIQUIS) 2.5 MG TABS tablet  2. How are you currently taking this medication (dosage and times per day)?  Take 1 tablet (2.5 mg total) by mouth 2 (two) times daily. 3. Are you having a reaction (difficulty breathing--STAT)? No  4. What is your medication issue? Patient wants to know can the prescription for the 5 mg pills of Eliquis be kept and they just cut the pill in half. Patient's husband states that the prescription is the same price as the 2.5 mg and if the keep the same 5 mg prescription and cut the pills in half, it would save them money. Please call back to confirm.

## 2019-01-12 NOTE — Telephone Encounter (Signed)
Patient's husband would like to talk to [name] about his wife's order for eliquis. Informed patient's husband that we could only order what is prescribed to the patient by the doctor. Patient recently had 5 mg eliquis filled, so patient has a lot of the 5 mg tablets, informed him that it is not recommended for them to cut the pills due to getting accurate dosing, but they can cut what they have. Patient's husband was not happy about this and he would like for [name] to call him directly. Will forward to [name].

## 2019-01-12 NOTE — Telephone Encounter (Signed)
dont have time to call him about that you are correct

## 2019-01-14 DIAGNOSIS — S91002D Unspecified open wound, left ankle, subsequent encounter: Secondary | ICD-10-CM | POA: Diagnosis not present

## 2019-01-14 DIAGNOSIS — L89522 Pressure ulcer of left ankle, stage 2: Secondary | ICD-10-CM | POA: Diagnosis not present

## 2019-01-23 DIAGNOSIS — S91002D Unspecified open wound, left ankle, subsequent encounter: Secondary | ICD-10-CM | POA: Diagnosis not present

## 2019-01-23 DIAGNOSIS — L89522 Pressure ulcer of left ankle, stage 2: Secondary | ICD-10-CM | POA: Diagnosis not present

## 2019-01-26 DIAGNOSIS — Z8673 Personal history of transient ischemic attack (TIA), and cerebral infarction without residual deficits: Secondary | ICD-10-CM | POA: Diagnosis not present

## 2019-01-26 DIAGNOSIS — K219 Gastro-esophageal reflux disease without esophagitis: Secondary | ICD-10-CM | POA: Diagnosis not present

## 2019-01-26 DIAGNOSIS — E782 Mixed hyperlipidemia: Secondary | ICD-10-CM | POA: Diagnosis not present

## 2019-01-26 DIAGNOSIS — D594 Other nonautoimmune hemolytic anemias: Secondary | ICD-10-CM | POA: Diagnosis not present

## 2019-01-26 DIAGNOSIS — I10 Essential (primary) hypertension: Secondary | ICD-10-CM | POA: Diagnosis not present

## 2019-01-26 DIAGNOSIS — E785 Hyperlipidemia, unspecified: Secondary | ICD-10-CM | POA: Diagnosis not present

## 2019-01-26 DIAGNOSIS — D72829 Elevated white blood cell count, unspecified: Secondary | ICD-10-CM | POA: Diagnosis not present

## 2019-01-29 ENCOUNTER — Other Ambulatory Visit: Payer: Self-pay | Admitting: Cardiovascular Disease

## 2019-02-03 DIAGNOSIS — L89522 Pressure ulcer of left ankle, stage 2: Secondary | ICD-10-CM | POA: Diagnosis not present

## 2019-02-03 DIAGNOSIS — R2242 Localized swelling, mass and lump, left lower limb: Secondary | ICD-10-CM | POA: Diagnosis not present

## 2019-02-17 DIAGNOSIS — L89522 Pressure ulcer of left ankle, stage 2: Secondary | ICD-10-CM | POA: Diagnosis not present

## 2019-02-20 DIAGNOSIS — E782 Mixed hyperlipidemia: Secondary | ICD-10-CM | POA: Diagnosis not present

## 2019-02-20 DIAGNOSIS — N183 Chronic kidney disease, stage 3 unspecified: Secondary | ICD-10-CM | POA: Diagnosis not present

## 2019-02-20 DIAGNOSIS — I10 Essential (primary) hypertension: Secondary | ICD-10-CM | POA: Diagnosis not present

## 2019-02-20 DIAGNOSIS — E785 Hyperlipidemia, unspecified: Secondary | ICD-10-CM | POA: Diagnosis not present

## 2019-02-24 DIAGNOSIS — G894 Chronic pain syndrome: Secondary | ICD-10-CM | POA: Diagnosis not present

## 2019-02-24 DIAGNOSIS — M810 Age-related osteoporosis without current pathological fracture: Secondary | ICD-10-CM | POA: Diagnosis not present

## 2019-02-24 DIAGNOSIS — Z0001 Encounter for general adult medical examination with abnormal findings: Secondary | ICD-10-CM | POA: Diagnosis not present

## 2019-02-24 DIAGNOSIS — K219 Gastro-esophageal reflux disease without esophagitis: Secondary | ICD-10-CM | POA: Diagnosis not present

## 2019-02-24 DIAGNOSIS — I482 Chronic atrial fibrillation, unspecified: Secondary | ICD-10-CM | POA: Diagnosis not present

## 2019-02-24 DIAGNOSIS — E782 Mixed hyperlipidemia: Secondary | ICD-10-CM | POA: Diagnosis not present

## 2019-02-24 DIAGNOSIS — R7301 Impaired fasting glucose: Secondary | ICD-10-CM | POA: Diagnosis not present

## 2019-02-24 DIAGNOSIS — D594 Other nonautoimmune hemolytic anemias: Secondary | ICD-10-CM | POA: Diagnosis not present

## 2019-02-24 DIAGNOSIS — Z8673 Personal history of transient ischemic attack (TIA), and cerebral infarction without residual deficits: Secondary | ICD-10-CM | POA: Diagnosis not present

## 2019-02-24 DIAGNOSIS — I129 Hypertensive chronic kidney disease with stage 1 through stage 4 chronic kidney disease, or unspecified chronic kidney disease: Secondary | ICD-10-CM | POA: Diagnosis not present

## 2019-03-11 ENCOUNTER — Telehealth: Payer: Self-pay

## 2019-03-11 NOTE — Telephone Encounter (Signed)
Patient calling stating she needs pre auth for pantoprazole (protonix 40 mg) per [facility]. Will send request to our pre auth nurse.

## 2019-03-12 NOTE — Telephone Encounter (Signed)
**Note De-Identified [name] Obfuscation** After reviewing the pt and his wife [name]'s charts I found that Dr Johnsie Cancel did not prescribe Pantoprazole for either of them but has been refilling as a courtesy to the pt.  The pt states that a MD in [place] put him and his wife on it years ago. He does have Peptic Ulcer Disease that he states he is seeing Dr Nevada Crane for now. She has Ulcers and gerd and  is seeing Dr Nevada Crane for now as well.  I have advised them both to contact Dr [name] office for further refills and PAs for this medication.  The pts verbalized understanding and thanked me for calling them and explaining this to them.

## 2019-04-15 DIAGNOSIS — E785 Hyperlipidemia, unspecified: Secondary | ICD-10-CM | POA: Diagnosis not present

## 2019-04-15 DIAGNOSIS — K219 Gastro-esophageal reflux disease without esophagitis: Secondary | ICD-10-CM | POA: Diagnosis not present

## 2019-04-15 DIAGNOSIS — I10 Essential (primary) hypertension: Secondary | ICD-10-CM | POA: Diagnosis not present

## 2019-04-15 DIAGNOSIS — I482 Chronic atrial fibrillation, unspecified: Secondary | ICD-10-CM | POA: Diagnosis not present

## 2019-04-15 DIAGNOSIS — D594 Other nonautoimmune hemolytic anemias: Secondary | ICD-10-CM | POA: Diagnosis not present

## 2019-04-15 DIAGNOSIS — D72829 Elevated white blood cell count, unspecified: Secondary | ICD-10-CM | POA: Diagnosis not present

## 2019-04-15 DIAGNOSIS — Z8673 Personal history of transient ischemic attack (TIA), and cerebral infarction without residual deficits: Secondary | ICD-10-CM | POA: Diagnosis not present

## 2019-04-15 DIAGNOSIS — E782 Mixed hyperlipidemia: Secondary | ICD-10-CM | POA: Diagnosis not present

## 2019-05-02 DIAGNOSIS — Z23 Encounter for immunization: Secondary | ICD-10-CM | POA: Diagnosis not present

## 2019-05-06 NOTE — Progress Notes (Signed)
Patient ID: KARIZMA CHEEK, female   DOB: Jul 30, 1940, 79 y.o.   MRN: [id number]     79 y.o. 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 LICA 59-16% duplex 06/14/44 by our criteria with peak velocity 2.64 m/sec and diastolic only 50 cm/sec  GI bleed 02/20/15 transfused 4 units INR over 10 coumadin changed to eliquis. Had gastric and duodenal ulcers. Chronic lung disease with necrotizing pneumonia moderate COPD sees Ramaschawmy from pulmonary   She seems depressed about her inability to do things and wants to limit diagnostic testing  Still issues with falling Using rolling wheel chair now  Feels her UE tremors are worse  Seen by Dr Tat 12/12/17 ? Decrease dose of amiodarone further Or change eliquis to pradaxa and try primidone Also noted left foot drop ? From lumbar radiculopathy  Told not to start Evinity for osteoporosis due to increased risk of MI/Stroke Dr Nevada Crane suggested Getting Prolia now  Husband upset about cost of eliquis and wanted 5 mg tabs called in so they could cut them in half Discussed how This is not appropriate and not advised to cut eliquis in half   Feels well able to get vaccine last week   ROS: Denies fever, malais, weight loss, blurry vision, decreased visual acuity, cough, sputum, SOB, hemoptysis, pleuritic pain, palpitaitons, heartburn, abdominal pain, melena, lower extremity edema, claudication, or rash.  All other systems reviewed and negative   General: BP 110/60   Pulse (!) 107   Ht 5\' 3"  (1.6 m)   Wt 121 lb (54.9 kg)   SpO2 97%   BMI 21.43 kg/m  Affect appropriate Frail thin chronically ill white female  HEENT: normal Neck supple with no adenopathy JVP normal bilateral carotid and subclavian  bruits no thyromegaly Lungs clear with no wheezing and good diaphragmatic motion Heart:  S1/S2 no murmur, no rub, gallop or click PMI normal Abdomen: benighn, BS positve, no tenderness, no AAA no bruit.   No HSM or HJR Distal pulses intact with no bruits No edema Neuro UE tremors left foot drop  Skin bruising over arms  No muscular weakness     Current Outpatient Medications  Medication Sig Dispense Refill  . amLODipine (NORVASC) 5 MG tablet TAKE 1 TABLET BY MOUTH  DAILY 90 tablet 3  . apixaban (ELIQUIS) 2.5 MG TABS tablet Take 1 tablet (2.5 mg total) by mouth 2 (two) times daily. 180 tablet 1  . calcium-vitamin D (OSCAL) 250-125 MG-UNIT per tablet Take 1 tablet by mouth 2 (two) times daily.      . clopidogrel (PLAVIX) 75 MG tablet TAKE 1 TABLET BY MOUTH  EVERY MORNING 90 tablet 2  . denosumab (PROLIA) 60 MG/ML SOSY injection Inject 60 mg into the skin every 6 (six) months.    . ferrous sulfate 325 (65 FE) MG tablet Take 325 mg by mouth daily with breakfast.    . furosemide (LASIX) 20 MG tablet Take 20 mg by mouth as needed. Only as needed PRN    . irbesartan (AVAPRO) 150 MG tablet TAKE 1 TABLET BY MOUTH  EVERY EVENING. PLEASE KEEP  UPCOMING APPT. WITH DR.  Johnsie Cancel IN JULY FOR FUTURE  REFILLS 90 tablet 1  . magnesium oxide (MAG-OX) 400 MG tablet Take 400 mg by mouth every morning.     . metoprolol tartrate (LOPRESSOR) 25 MG tablet TAKE 3 TABLETS BY MOUTH TWO TIMES DAILY 540 tablet 3  . Multiple Vitamin (MULTIVITAMIN WITH MINERALS) TABS tablet  Take 1 tablet by mouth every morning.    . naloxegol oxalate (MOVANTIK) 25 MG TABS tablet Take 25 mg by mouth daily as needed (constipation).     Marland Kitchen oxyCODONE-acetaminophen (PERCOCET/ROXICET) 5-325 MG tablet Take 1-2 tablets by mouth daily as needed for pain.    . pantoprazole (PROTONIX) 40 MG tablet TAKE 1 TABLET BY MOUTH  DAILY 90 tablet 2  . pravastatin (PRAVACHOL) 20 MG tablet TAKE 1 TABLET BY MOUTH  DAILY 90 tablet 3  . Tiotropium Bromide Monohydrate (SPIRIVA RESPIMAT) 2.5 MCG/ACT AERS USE 2 INHALATIONS BY MOUTH  DAILY 12 g 3  . vitamin C (ASCORBIC ACID) 500 MG tablet Take 500 mg by mouth every morning.      No current facility-administered  medications for this visit.    Allergies  Iodine, Iohexol, Povidone-iodine, and Tape  Electrocardiogram:  05/21/14  SR PR 276  LVH  06/03/17  SR rate 57 PR 308 msec   Assessment and Plan Vascular: right innominate occlusion with moderate LICA stenosis f/u duplex June 2021 at [facility] PAF:  Continue eliquis lower to 2.5 bid if any further bleeding issues  Chol:  On statin.  LDL not at goal increase mevacor to 40 mg f/u labs with primary 3 months  HTN:  Continue ARB controlled with right arm BP lower than left due to PVD Pulmonary:  Quit smoking 11 years ago Gold Class 2 COPD f/u pulmonary  Edema:  No DVT by duplex norvasc decreased stable Anemia :  History of gastric ulcers continue protonix f/u GI  Lab Results  Component Value Date   HCT 39.9 05/28/2016   CAD:  No chest pain extensive by CT last cath 2011 with no hemodynamically significant disease Continue plavix not on ASA due to GI bleed and need for eliquis for PAF  Tremor:  Discussed changing from eliquis to pradaxa and starting primidone Amiodarone has been d/c She feels tremors are better and does not want to change her meds   F/u with me in 6 months with duplex Pravachol 40 mg  Lipid liver in 3 months   Jenkins Rouge

## 2019-05-11 DIAGNOSIS — L89522 Pressure ulcer of left ankle, stage 2: Secondary | ICD-10-CM | POA: Diagnosis not present

## 2019-05-11 DIAGNOSIS — R2242 Localized swelling, mass and lump, left lower limb: Secondary | ICD-10-CM | POA: Diagnosis not present

## 2019-05-13 ENCOUNTER — Encounter: Payer: Self-pay | Admitting: Cardiovascular Disease

## 2019-05-13 ENCOUNTER — Encounter (INDEPENDENT_AMBULATORY_CARE_PROVIDER_SITE_OTHER): Payer: Self-pay

## 2019-05-13 ENCOUNTER — Other Ambulatory Visit: Payer: Self-pay

## 2019-05-13 ENCOUNTER — Ambulatory Visit (INDEPENDENT_AMBULATORY_CARE_PROVIDER_SITE_OTHER): Payer: Medicare Other | Admitting: Cardiovascular Disease

## 2019-05-13 VITALS — BP 110/60 | HR 107 | Ht 63.0 in | Wt 121.0 lb

## 2019-05-13 DIAGNOSIS — I4891 Unspecified atrial fibrillation: Secondary | ICD-10-CM | POA: Diagnosis not present

## 2019-05-13 DIAGNOSIS — E785 Hyperlipidemia, unspecified: Secondary | ICD-10-CM

## 2019-05-13 DIAGNOSIS — I10 Essential (primary) hypertension: Secondary | ICD-10-CM | POA: Diagnosis not present

## 2019-05-13 DIAGNOSIS — I779 Disorder of arteries and arterioles, unspecified: Secondary | ICD-10-CM | POA: Diagnosis not present

## 2019-05-13 MED ORDER — PRAVASTATIN SODIUM 40 MG PO TABS: 40.0000 mg | ORAL_TABLET | Freq: Every day | ORAL | 3 refills | Status: DC

## 2019-05-13 NOTE — Patient Instructions (Addendum)
Your physician recommends that you continue on your current medications as directed. Please refer to the Current Medication list given to you today. Your physician recommends that you return for lab work in:  [place] has requested that you have a carotid duplex. This test is an ultrasound of the carotid arteries in your neck. It looks at blood flow through these arteries that supply the brain with blood. Allow one hour for this exam. There are no restrictions or special instructions.   Your physician wants you to follow-up in: [place] will receive a reminder letter in the mail two months in advance. If you don't receive a letter, please call our office to schedule the follow-up appointment.

## 2019-05-14 DIAGNOSIS — D594 Other nonautoimmune hemolytic anemias: Secondary | ICD-10-CM | POA: Diagnosis not present

## 2019-05-14 DIAGNOSIS — I10 Essential (primary) hypertension: Secondary | ICD-10-CM | POA: Diagnosis not present

## 2019-05-14 DIAGNOSIS — K219 Gastro-esophageal reflux disease without esophagitis: Secondary | ICD-10-CM | POA: Diagnosis not present

## 2019-05-14 DIAGNOSIS — E7849 Other hyperlipidemia: Secondary | ICD-10-CM | POA: Diagnosis not present

## 2019-05-14 DIAGNOSIS — E785 Hyperlipidemia, unspecified: Secondary | ICD-10-CM | POA: Diagnosis not present

## 2019-05-14 DIAGNOSIS — E782 Mixed hyperlipidemia: Secondary | ICD-10-CM | POA: Diagnosis not present

## 2019-05-14 DIAGNOSIS — D72829 Elevated white blood cell count, unspecified: Secondary | ICD-10-CM | POA: Diagnosis not present

## 2019-05-14 DIAGNOSIS — Z8673 Personal history of transient ischemic attack (TIA), and cerebral infarction without residual deficits: Secondary | ICD-10-CM | POA: Diagnosis not present

## 2019-05-15 ENCOUNTER — Encounter (HOSPITAL_COMMUNITY): Payer: Self-pay

## 2019-05-15 ENCOUNTER — Encounter (HOSPITAL_COMMUNITY)
Admission: RE | Admit: 2019-05-15 | Discharge: 2019-05-15 | Disposition: A | Discharge status: Home / Self Care | Payer: Medicare Other | Source: Ambulatory Visit | Attending: Internal Medicine | Admitting: Internal Medicine

## 2019-05-15 ENCOUNTER — Other Ambulatory Visit: Payer: Self-pay

## 2019-05-15 DIAGNOSIS — M810 Age-related osteoporosis without current pathological fracture: Secondary | ICD-10-CM | POA: Insufficient documentation

## 2019-05-15 MED ORDER — DENOSUMAB 60 MG/ML ~~LOC~~ SOSY
60.0000 mg | PREFILLED_SYRINGE | Freq: Once | SUBCUTANEOUS | Status: AC
  Administered 2019-05-15: 60 mg via SUBCUTANEOUS

## 2019-06-08 DIAGNOSIS — R2242 Localized swelling, mass and lump, left lower limb: Secondary | ICD-10-CM | POA: Diagnosis not present

## 2019-06-08 DIAGNOSIS — L89522 Pressure ulcer of left ankle, stage 2: Secondary | ICD-10-CM | POA: Diagnosis not present

## 2019-07-20 ENCOUNTER — Other Ambulatory Visit: Payer: Self-pay

## 2019-07-20 ENCOUNTER — Other Ambulatory Visit: Payer: Self-pay | Admitting: Pharmacist

## 2019-07-20 MED ORDER — APIXABAN 2.5 MG PO TABS: 2.5000 mg | ORAL_TABLET | Freq: Two times a day (BID) | ORAL | 1 refills | Status: DC

## 2019-07-20 MED ORDER — METOPROLOL TARTRATE 25 MG PO TABS: 75.0000 mg | ORAL_TABLET | Freq: Two times a day (BID) | ORAL | 3 refills | Status: DC

## 2019-07-20 MED ORDER — PANTOPRAZOLE SODIUM 40 MG PO TBEC: 40.0000 mg | DELAYED_RELEASE_TABLET | Freq: Every day | ORAL | 3 refills | Status: DC

## 2019-07-20 MED ORDER — CLOPIDOGREL BISULFATE 75 MG PO TABS: 75.0000 mg | ORAL_TABLET | Freq: Every morning | ORAL | 3 refills | Status: DC

## 2019-07-20 MED ORDER — AMLODIPINE BESYLATE 5 MG PO TABS: 5.0000 mg | ORAL_TABLET | Freq: Every day | ORAL | 3 refills | Status: DC

## 2019-07-20 MED ORDER — IRBESARTAN 150 MG PO TABS: ORAL_TABLET | ORAL | 3 refills | Status: DC

## 2019-07-20 MED ORDER — PRAVASTATIN SODIUM 40 MG PO TABS: 40.0000 mg | ORAL_TABLET | Freq: Every day | ORAL | 3 refills | Status: DC

## 2019-07-20 NOTE — Progress Notes (Signed)
Age 79, weight 55kg, SCr 0.92 on 10/13/18, don't think SCr page was scanned in on 02/20/19 labs, afib indication, last OV Feb 2021

## 2019-07-29 ENCOUNTER — Other Ambulatory Visit: Payer: Self-pay | Admitting: Cardiovascular Disease

## 2019-08-06 ENCOUNTER — Telehealth: Payer: Self-pay | Admitting: Cardiovascular Disease

## 2019-08-06 NOTE — Telephone Encounter (Signed)
Pt advised per her request that I faxed lab orders for Lipid and Hepatic panel per [name] to Dr. Wende Neighbors... pt advised lab should be fasting.   Joylene Igo [phone] Phone [phone]

## 2019-08-06 NOTE — Telephone Encounter (Signed)
New Message:     Pt would like to have her lab work at her primary doctor(Dr [facility]). Would you please fax an order there, so she can have her lab work there.

## 2019-08-14 DIAGNOSIS — G589 Mononeuropathy, unspecified: Secondary | ICD-10-CM | POA: Diagnosis not present

## 2019-08-14 DIAGNOSIS — E7849 Other hyperlipidemia: Secondary | ICD-10-CM | POA: Diagnosis not present

## 2019-08-14 DIAGNOSIS — E782 Mixed hyperlipidemia: Secondary | ICD-10-CM | POA: Diagnosis not present

## 2019-08-14 DIAGNOSIS — I129 Hypertensive chronic kidney disease with stage 1 through stage 4 chronic kidney disease, or unspecified chronic kidney disease: Secondary | ICD-10-CM | POA: Diagnosis not present

## 2019-08-14 DIAGNOSIS — H6122 Impacted cerumen, left ear: Secondary | ICD-10-CM | POA: Diagnosis not present

## 2019-08-14 DIAGNOSIS — E785 Hyperlipidemia, unspecified: Secondary | ICD-10-CM | POA: Diagnosis not present

## 2019-08-14 DIAGNOSIS — D594 Other nonautoimmune hemolytic anemias: Secondary | ICD-10-CM | POA: Diagnosis not present

## 2019-08-14 DIAGNOSIS — I482 Chronic atrial fibrillation, unspecified: Secondary | ICD-10-CM | POA: Diagnosis not present

## 2019-08-14 DIAGNOSIS — H6120 Impacted cerumen, unspecified ear: Secondary | ICD-10-CM | POA: Diagnosis not present

## 2019-08-14 DIAGNOSIS — G894 Chronic pain syndrome: Secondary | ICD-10-CM | POA: Diagnosis not present

## 2019-08-14 DIAGNOSIS — I10 Essential (primary) hypertension: Secondary | ICD-10-CM | POA: Diagnosis not present

## 2019-08-14 DIAGNOSIS — D72829 Elevated white blood cell count, unspecified: Secondary | ICD-10-CM | POA: Diagnosis not present

## 2019-08-21 ENCOUNTER — Telehealth: Payer: Self-pay | Admitting: Cardiovascular Disease

## 2019-08-21 NOTE — Telephone Encounter (Signed)
I spoke to the patient's spouse and informed them that we have not received the lab work from her PCP.  They will call the PCP and have them refax labs.

## 2019-08-21 NOTE — Telephone Encounter (Signed)
   Pt is calling to check if we received blood work from Dr. [name] office and she would like to know results  Please advise

## 2019-08-27 NOTE — Telephone Encounter (Signed)
Patient following up on lab work.

## 2019-08-27 NOTE — Telephone Encounter (Signed)
Lab work has been received and it is on patient's chart. Routed lab results to [name].

## 2019-08-28 NOTE — Telephone Encounter (Signed)
Follow up  Pt called back to follow up, advised we just received lab results and she would like to know the results

## 2019-08-28 NOTE — Telephone Encounter (Signed)
Called patient back to let her know [name] reviewed lab work and recommends that she follow up with her PCP for elevated potassium. Patient verbalized understanding.

## 2019-09-02 ENCOUNTER — Other Ambulatory Visit: Payer: Self-pay | Admitting: Cardiovascular Disease

## 2019-09-02 NOTE — Telephone Encounter (Signed)
Eliquis 2.5mg  refill request received. Patient is 79 years old, weight-54.9kg, Crea-1.15 on 08/14/2019 via scanned labs on 08/14/2019 from Dr. [name] office, Diagnosis-Afib, and last seen by [name] on 05/13/2019. Dose is appropriate based on dosing criteria. Will send in refill to requested pharmacy.

## 2019-09-10 ENCOUNTER — Other Ambulatory Visit (HOSPITAL_COMMUNITY): Payer: Self-pay | Admitting: Internal Medicine

## 2019-09-10 ENCOUNTER — Telehealth: Payer: Self-pay | Admitting: Cardiovascular Disease

## 2019-09-10 DIAGNOSIS — I779 Disorder of arteries and arterioles, unspecified: Secondary | ICD-10-CM

## 2019-09-10 DIAGNOSIS — R0989 Other specified symptoms and signs involving the circulatory and respiratory systems: Secondary | ICD-10-CM

## 2019-09-10 DIAGNOSIS — I6523 Occlusion and stenosis of bilateral carotid arteries: Secondary | ICD-10-CM

## 2019-09-10 NOTE — Telephone Encounter (Signed)
Patient called and said she is due for her yearly Carotid Artery Scan. She would like to have the test done at [facility]. Please cal the patient when the orders are in her chart so she can call Forestine Na to get it scheduled

## 2019-09-10 NOTE — Telephone Encounter (Signed)
New order placed for pt to have bilateral carotid US done at [facility], per pt request and per [name]. [name] in [facility] Scheduling requested new order, and will call pt today to have this arranged.

## 2019-09-10 NOTE — Telephone Encounter (Signed)
Staff message sent to [facility] Scheduler Parks Neptune and [facility] pool, to call the pt back and arrange her Carotids to be done at [facility], for she is due to have this done this month.  Order was previously placed by a triage Nurse a few months ago, with notation to schedule this in June 2021, and arrange this test to be done at [facility], per pt request.  Scheduling to call the pt and arrange accordingly.  Scheduling to sent Dr. Kyla Balzarine Primary Covering RN a staff message when this test is scheduled for in June, for follow-up purposes.   Tried calling the pt to endorse to her that a message has been sent to our scheduling dept to call her back and arrange needed test, but the pt did not answer and VM was not working appropriately at this time.

## 2019-09-10 NOTE — Telephone Encounter (Signed)
Pts Carotid US is scheduled for 10/01/19 at [facility].  Pt made aware of appt date and time by [facility] scheduling dept. Will send this message to Dr. Kyla Balzarine RN as a general FYI.

## 2019-09-15 ENCOUNTER — Ambulatory Visit (HOSPITAL_COMMUNITY): Payer: Medicare Other

## 2019-09-17 ENCOUNTER — Other Ambulatory Visit: Payer: Self-pay

## 2019-09-17 ENCOUNTER — Telehealth: Payer: Self-pay | Admitting: Orthopedic Surgery

## 2019-09-17 ENCOUNTER — Emergency Department (HOSPITAL_COMMUNITY)
Admission: EM | Admit: 2019-09-17 | Discharge: 2019-09-17 | Disposition: A | Discharge status: Home / Self Care | Payer: Medicare HMO | Attending: Emergency Medicine | Admitting: Emergency Medicine

## 2019-09-17 ENCOUNTER — Encounter (HOSPITAL_COMMUNITY): Payer: Self-pay | Admitting: *Deleted

## 2019-09-17 ENCOUNTER — Emergency Department (HOSPITAL_COMMUNITY): Payer: Medicare HMO

## 2019-09-17 DIAGNOSIS — W1839XA Other fall on same level, initial encounter: Secondary | ICD-10-CM | POA: Insufficient documentation

## 2019-09-17 DIAGNOSIS — Y999 Unspecified external cause status: Secondary | ICD-10-CM | POA: Diagnosis not present

## 2019-09-17 DIAGNOSIS — M859 Disorder of bone density and structure, unspecified: Secondary | ICD-10-CM | POA: Insufficient documentation

## 2019-09-17 DIAGNOSIS — S32592A Other specified fracture of left pubis, initial encounter for closed fracture: Secondary | ICD-10-CM | POA: Diagnosis not present

## 2019-09-17 DIAGNOSIS — W19XXXA Unspecified fall, initial encounter: Secondary | ICD-10-CM

## 2019-09-17 DIAGNOSIS — S79912A Unspecified injury of left hip, initial encounter: Secondary | ICD-10-CM | POA: Diagnosis present

## 2019-09-17 DIAGNOSIS — Y9389 Activity, other specified: Secondary | ICD-10-CM | POA: Insufficient documentation

## 2019-09-17 DIAGNOSIS — Y929 Unspecified place or not applicable: Secondary | ICD-10-CM | POA: Insufficient documentation

## 2019-09-17 MED ORDER — HYDROCODONE-ACETAMINOPHEN 5-325 MG PO TABS: 1.0000 | ORAL_TABLET | Freq: Four times a day (QID) | ORAL | 0 refills | Status: DC | PRN

## 2019-09-17 NOTE — Telephone Encounter (Signed)
Call received from patient asking about a same day appointment due to a fall - concerned about hip injury. Relayed no immediate appointments - Dr just finishing surgery, no availability. Will go to [facility] urgent care. Will call back if needs to schedule a follow up visit.

## 2019-09-17 NOTE — ED Triage Notes (Signed)
FELL AROUND 1130. PAIN IN LEFT HIP

## 2019-09-17 NOTE — Discharge Instructions (Addendum)
Take the pain medication as needed.  Based on the x-ray findings there is evidence of a left pubic ramus fracture.  Questionable duration but since there has not been any recent falls and this fall did occur on the left side may be related.  No evidence of any other fractures.  This would be a stable fracture and you can ambulate on it.  Make an appointment to follow-up with Dr. Aline Brochure from orthopedics.  Return for any new or worse symptoms.

## 2019-09-17 NOTE — ED Triage Notes (Signed)
Left hip pain

## 2019-09-17 NOTE — ED Provider Notes (Signed)
[facility] EMERGENCY DEPARTMENT Provider Note   CSN: [id number] Arrival date & time: 09/17/19  1450     History Chief Complaint  Patient presents with  . Fall  . Hip Injury    [name] is a 79 y.o. female.  Patient status post fall was trying to use her wheelchair as a walker instead of a walker.  Wheelchair got out in front of her and she fell on her left side.  Complaint of pain around the left hip area.  But more up around the iliac crest.  Patient's been able to get back on her feet since then.  And can walk but it causes pain.  Did not hit her head no loss of consciousness no other injuries.        Past Medical History:  Diagnosis Date  . Anemia 05/2014.  Marland Kitchen Atrial fibrillation ([place])    x 3 yrs  . Breast cancer ([place])    S/P left mastectomy and chemotherapy 1989 remained in remission  . Carotid artery occlusion   . Carotid bruit    LICA 78-29% (duplex 5/62)  . Degenerative arthritis of spine 2015   Chronic back pain  . Hyperlipidemia   . Hypertension   . Meningioma ([place])   . Osteoporosis   . Pneumonia May 19, 2014  . Stroke [facility])    CVA 2008  . Subclavian steal syndrome   . Ulcers of both lower extremities ([place]) 2015    Patient Active Problem List   Diagnosis Date Noted  . Tibial plateau fracture, left approx. 05/10/17 06/27/2017  . COPD, moderate ([place]) 07/12/2015  . Multiple gastric ulcers   . Coagulopathy ([place])   . Bleeding gastrointestinal   . Acute GI bleeding   . CAD in native artery   . Supratherapeutic INR   . Right lower lobe lung mass   . Cavitary lesion of lung   . Acute renal failure ([place])   . SIRS (systemic inflammatory response syndrome) ([facility])   . Chronic back pain   . GI bleed 02/14/2015  . Carotid artery disease ([place]) 02/14/2015  . Leukocytosis 02/14/2015  . Lung nodules 09/29/2014  . Smoking history 09/29/2014  . Pneumonia with cavity of lung 07/09/2014  . Lung mass 07/09/2014  . Thrombocytosis ([place]) 05/21/2014  .  Protein-calorie malnutrition, severe ([place]) 05/21/2014  . Anemia   . Generalized weakness   . Necrotic pneumonia ([place]) 05/20/2014  . Pneumonia 05/20/2014  . Elevated liver enzymes 12/16/2013  . Tremor 06/24/2013  . Encounter for therapeutic drug monitoring 05/18/2013  . Dizziness 01/12/2013  . Wound of ankle 10/23/2010  . Long term current use of anticoagulant 07/05/2010  . Carotid bruit 11/01/2009  . Hyperlipemia 10/26/2009  . Essential hypertension 10/26/2009  . Atrial fibrillation ([place]) 10/26/2009  . ACUTE ON CHRONIC DIASTOLIC HEART FAILURE 13/11/6576  . SYNCOPE AND COLLAPSE 10/26/2009    Past Surgical History:  Procedure Laterality Date  . CARDIAC CATHETERIZATION    . CATARACT EXTRACTION Bilateral 2013  . COLONOSCOPY  11/17/2010   Procedure: COLONOSCOPY;  Surgeon: [name], MD;  Location: [facility];  Service: Endoscopy;  Laterality: N/A;  10:45 am  . ESOPHAGOGASTRODUODENOSCOPY  11/17/2010   Procedure: ESOPHAGOGASTRODUODENOSCOPY (EGD);  Surgeon: [name], MD;  Location: [facility];  Service: Endoscopy;  Laterality: N/A;  . ESOPHAGOGASTRODUODENOSCOPY N/A 02/16/2015   Procedure: ESOPHAGOGASTRODUODENOSCOPY (EGD);  Surgeon: [name], MD;  Location: [place];  Service: Gastroenterology;  Laterality: N/A;  . EXPLORATORY LAPAROTOMY  1960s   For peritonitis of undetermined cause  . EYE SURGERY    . LUMBAR EPIDURAL INJECTION  06-2012--06-2013   pt. states she has had 5 epidurals in 06-2012----06-2013  . MASTECTOMY Left 1990  . YAG LASER APPLICATION Right 1/70/0174   Procedure: YAG LASER APPLICATION;  Surgeon: [name], MD;  Location: [facility] ORS;  Service: Ophthalmology;  Laterality: Right;  right  . YAG LASER APPLICATION Left 9/44/9675   Procedure: YAG LASER APPLICATION;  Surgeon: [name], MD;  Location: [facility] OR[facility];  Service: Ophthalmology;  Laterality: Left;     OB History   No obstetric history on file.     Family History  Problem  Relation Age of Onset  . Alzheimer's disease Mother   . Dementia Mother   . Hypertension Mother   . Hyperlipidemia Mother   . Parkinsonism Father     Social History   Tobacco Use  . Smoking status: Former Smoker    Packs/day: 1.00    Years: 56.00    Pack years: 56.00    Types: Cigarettes    Quit date: 08/14/2006    Years since quitting: 13.1  . Smokeless tobacco: Never Used  . Tobacco comment: quit in 2008  Vaping Use  . Vaping Use: Never used  Substance Use Topics  . Alcohol use: No    Alcohol/week: 0.0 standard drinks  . Drug use: No    Home Medications Prior to Admission medications   Medication Sig Start Date End Date Taking? Authorizing Provider  amLODipine (NORVASC) 5 MG tablet Take 1 tablet (5 mg total) by mouth daily. 07/20/19   Josue Hector, MD  calcium-vitamin D (OSCAL) 250-125 MG-UNIT per tablet Take 1 tablet by mouth 2 (two) times daily.      [provider]  clopidogrel (PLAVIX) 75 MG tablet Take 1 tablet (75 mg total) by mouth every morning. 07/20/19   Josue Hector, MD  denosumab (PROLIA) 60 MG/ML SOSY injection Inject 60 mg into the skin every 6 (six) months.    Celene Squibb, MD  ELIQUIS 2.5 MG TABS tablet TAKE 1 TABLET BY MOUTH  TWICE DAILY 09/02/19   Josue Hector, MD  ferrous sulfate 325 (65 FE) MG tablet Take 325 mg by mouth daily with breakfast.    [provider]  furosemide (LASIX) 20 MG tablet Take 20 mg by mouth as needed. Only as needed PRN    [provider]  HYDROcodone-acetaminophen (NORCO/VICODIN) 5-325 MG tablet Take 1 tablet by mouth every 6 (six) hours as needed. 09/17/19   Fredia Sorrow, MD  irbesartan (AVAPRO) 150 MG tablet TAKE 1 TABLET BY MOUTH  EVERY EVENING. 07/31/19   Josue Hector, MD  magnesium oxide (MAG-OX) 400 MG tablet Take 400 mg by mouth every morning.     [provider]  metoprolol tartrate (LOPRESSOR) 25 MG tablet Take 3 tablets (75 mg total) by mouth 2 (two) times daily. 07/20/19    Josue Hector, MD  Multiple Vitamin (MULTIVITAMIN WITH MINERALS) TABS tablet Take 1 tablet by mouth every morning.    [provider]  naloxegol oxalate (MOVANTIK) 25 MG TABS tablet Take 25 mg by mouth daily as needed (constipation).     [provider]  oxyCODONE-acetaminophen (PERCOCET/ROXICET) 5-325 MG tablet Take 1-2 tablets by mouth daily as needed for pain. 04/16/16   [provider]  pantoprazole (PROTONIX) 40 MG tablet Take 1 tablet (40 mg total) by mouth daily. 07/20/19   Josue Hector, MD  pravastatin (PRAVACHOL) 40 MG tablet Take 1 tablet (40 mg total) by mouth daily. 07/20/19   Josue Hector, MD  Tiotropium Bromide Monohydrate (SPIRIVA RESPIMAT) 2.5 MCG/ACT AERS USE 2 INHALATIONS BY MOUTH  DAILY 12/19/18   Martyn Ehrich, NP  vitamin C (ASCORBIC ACID) 500 MG tablet Take 500 mg by mouth every morning.     [provider]    Allergies    Iodine, Iohexol, Povidone-iodine, and Tape  Review of Systems   Review of Systems  Constitutional: Negative for chills and fever.  HENT: Negative for congestion, rhinorrhea and sore throat.   Eyes: Negative for visual disturbance.  Respiratory: Negative for cough and shortness of breath.   Cardiovascular: Negative for chest pain and leg swelling.  Gastrointestinal: Negative for abdominal pain, diarrhea, nausea and vomiting.  Genitourinary: Negative for dysuria.  Musculoskeletal: Negative for back pain and neck pain.  Skin: Negative for rash.  Neurological: Negative for dizziness, light-headedness and headaches.  Hematological: Does not bruise/bleed easily.  Psychiatric/Behavioral: Negative for confusion.    Physical Exam Updated Vital Signs BP (!) 165/72   Pulse 67   Temp 98 F (36.7 C) (Oral)   Resp 15   Ht 1.575 m (5\' 2" )   SpO2 98%   BMI 22.13 kg/m   Physical Exam Vitals and nursing note reviewed.  Constitutional:      General: She is not in acute distress.    Appearance: She is  well-developed.  HENT:     Head: Normocephalic and atraumatic.  Eyes:     Conjunctiva/sclera: Conjunctivae normal.  Cardiovascular:     Rate and Rhythm: Normal rate and regular rhythm.     Heart sounds: No murmur heard.   Pulmonary:     Effort: Pulmonary effort is normal. No respiratory distress.     Breath sounds: Normal breath sounds.  Abdominal:     Palpations: Abdomen is soft.     Tenderness: There is no abdominal tenderness.  Musculoskeletal:        General: No swelling or deformity.     Cervical back: Neck supple. No tenderness.     Comments: Patient with some discomfort with range of motion of the left lower extremity.  No deformity.  Good cap refill distally.  No leg swelling.  Does have some tenderness to palpation on the edge of the iliac crest on the left side.  No bruising is no abrasions.  No back pain no neck pain.  Skin:    General: Skin is warm and dry.     Capillary Refill: Capillary refill takes less than 2 seconds.  Neurological:     General: No focal deficit present.     Mental Status: She is alert and oriented to person, place, and time.     Cranial Nerves: No cranial nerve deficit.     Sensory: No sensory deficit.     Motor: No weakness.     ED Results / Procedures / Treatments   Labs (all labs ordered are listed, but only abnormal results are displayed) Labs Reviewed - No data to display  EKG None  Radiology DG Hip Unilat With Pelvis 2-3 Views Left  Result Date: 09/17/2019 CLINICAL DATA:  Status post fall with subsequent left hip pain. EXAM: DG HIP (WITH OR WITHOUT PELVIS) 2-3V LEFT COMPARISON:  None. FINDINGS: A fracture deformity of indeterminate age is seen along the right inferior pubic ramus. Moderate severity degenerative changes are seen involving both hips with marked severity degenerative changes seen involving  the visualized portion of the lower lumbar spine. There is moderate severity vascular calcification. IMPRESSION: 1. Fracture of the  right inferior pubic ramus of indeterminate age. 2. Moderate severity degenerative changes involving both hips. Electronically Signed   By: Virgina Norfolk M.D.   On: 09/17/2019 16:08    Procedures Procedures (including critical care time)  Medications Ordered in ED Medications - No data to display  ED Course  I have reviewed the triage vital signs and the nursing notes.  Pertinent labs & imaging results that were available during my care of the patient were reviewed by me and considered in my medical decision making (see chart for details).    MDM Rules/Calculators/A&P                           Patient with fall to her left side complain of pain to the left hip area.  X-rays are consistent with a pubic ramus fracture.  Date kind of unknown but patient has not any recent falls.  Did have a pubic ramus fracture many years ago that apparently was hairline.  Doing this does not seem to represent that.  Some most likely this is related to the fall from today.  Even though her pain is more in the iliac crest area.  Will treat with pain medicine and have her follow-up with orthopedic she has been seen by Dr. Aline Brochure in the past.  They will return for any new or worse symptoms.  No apparent other injuries.   Final Clinical Impression(s) / ED Diagnoses Final diagnoses:  Fall, initial encounter  Pubic ramus fracture, left, closed, initial encounter [facility])    Rx / DC Orders ED Discharge Orders         Ordered    HYDROcodone-acetaminophen (NORCO/VICODIN) 5-325 MG tablet  Every 6 hours PRN     Discontinue  Reprint     09/17/19 1749           Fredia Sorrow, MD 09/17/19 1753

## 2019-09-25 ENCOUNTER — Encounter: Payer: Self-pay | Admitting: Orthopedic Surgery

## 2019-09-25 ENCOUNTER — Other Ambulatory Visit: Payer: Self-pay

## 2019-09-25 ENCOUNTER — Ambulatory Visit (INDEPENDENT_AMBULATORY_CARE_PROVIDER_SITE_OTHER): Payer: Medicare HMO | Admitting: Orthopedic Surgery

## 2019-09-25 DIAGNOSIS — M545 Low back pain: Secondary | ICD-10-CM | POA: Diagnosis not present

## 2019-09-25 DIAGNOSIS — G8929 Other chronic pain: Secondary | ICD-10-CM

## 2019-09-25 NOTE — Patient Instructions (Signed)
You had a contusion bruise when you fell, it should improve with time and activity modification

## 2019-09-25 NOTE — Progress Notes (Signed)
NEW PROBLEM//OFFICE VISIT  Chief Complaint  Patient presents with  . Pelvic Pain    fall on 09/17/19 pelvic fracture/ feeling better     79 year old female with osteoporosis fell complained of some left hip pain, points to her lower back, x-rays were obtained there was a questionable right inferior pubic ramus fracture no fracture seen on the left side  Patient says she is getting better denies any groin or thigh pain   Review of Systems  Musculoskeletal: Positive for back pain.  Neurological: Negative for tingling.     Past Medical History:  Diagnosis Date  . Anemia 05/2014.  Marland Kitchen Atrial fibrillation ([place])    x 3 yrs  . Breast cancer ([place])    S/P left mastectomy and chemotherapy 1989 remained in remission  . Carotid artery occlusion   . Carotid bruit    LICA 07-68% (duplex 0/88)  . Degenerative arthritis of spine 2015   Chronic back pain  . Hyperlipidemia   . Hypertension   . Meningioma ([place])   . Osteoporosis   . Pneumonia May 19, 2014  . Stroke [facility])    CVA 2008  . Subclavian steal syndrome   . Ulcers of both lower extremities ([place]) 2015    Past Surgical History:  Procedure Laterality Date  . CARDIAC CATHETERIZATION    . CATARACT EXTRACTION Bilateral 2013  . COLONOSCOPY  11/17/2010   Procedure: COLONOSCOPY;  Surgeon: [name], MD;  [facility];  Service: Endoscopy;  Laterality: N/A;  10:45 am  . ESOPHAGOGASTRODUODENOSCOPY  11/17/2010   Procedure: ESOPHAGOGASTRODUODENOSCOPY (EGD);  Surgeon: [name], MD;  [facility];  Service: Endoscopy;  Laterality: N/A;  . ESOPHAGOGASTRODUODENOSCOPY N/A 02/16/2015   Procedure: ESOPHAGOGASTRODUODENOSCOPY (EGD);  Surgeon: [name], MD;  Location: [place];  Service: Gastroenterology;  Laterality: N/A;  . EXPLORATORY LAPAROTOMY  1960s   For peritonitis of undetermined cause  . EYE SURGERY    . LUMBAR EPIDURAL INJECTION  06-2012--06-2013   pt. states she has had 5 epidurals  in 06-2012----06-2013  . MASTECTOMY Left 1990  . YAG LASER APPLICATION Right 04/18/3157   Procedure: YAG LASER APPLICATION;  Surgeon: [name], MD;  Location: [facility];  Service: Ophthalmology;  Laterality: Right;  right  . YAG LASER APPLICATION Left 4/58/5929   Procedure: YAG LASER APPLICATION;  Surgeon: [name], MD;  Location: [facility];  Service: Ophthalmology;  Laterality: Left;    Family History  Problem Relation Age of Onset  . Alzheimer's disease Mother   . Dementia Mother   . Hypertension Mother   . Hyperlipidemia Mother   . Parkinsonism Father    Social History   Tobacco Use  . Smoking status: Former Smoker    Packs/day: 1.00    Years: 56.00    Pack years: 56.00    Types: Cigarettes    Quit date: 08/14/2006    Years since quitting: 13.1  . Smokeless tobacco: Never Used  . Tobacco comment: quit in 2008  Vaping Use  . Vaping Use: Never used  Substance Use Topics  . Alcohol use: No    Alcohol/week: 0.0 standard drinks  . Drug use: No    Allergies  Allergen Reactions  . Iodine Rash and Other (See Comments)    BETADINE Rash/burning, blisters on skin. Burns skin  . Iohexol Rash and Other (See Comments)    Blisters; PT NEEDS 13-HOUR PREP   . Povidone-Iodine Other (See Comments)    Burns skin  . Tape Itching,  Rash and Other (See Comments)    Prefers PAPER TAPE Burns skin    Current Meds  Medication Sig  . amLODipine (NORVASC) 5 MG tablet Take 1 tablet (5 mg total) by mouth daily.  . calcium-vitamin D (OSCAL) 250-125 MG-UNIT per tablet Take 1 tablet by mouth 2 (two) times daily.    . clopidogrel (PLAVIX) 75 MG tablet Take 1 tablet (75 mg total) by mouth every morning.  . denosumab (PROLIA) 60 MG/ML SOSY injection Inject 60 mg into the skin every 6 (six) months.  Marland Kitchen ELIQUIS 2.5 MG TABS tablet TAKE 1 TABLET BY MOUTH  TWICE DAILY  . ferrous sulfate 325 (65 FE) MG tablet Take 325 mg by mouth daily with breakfast.  . furosemide (LASIX) 20 MG tablet Take 20 mg by  mouth as needed. Only as needed PRN  . irbesartan (AVAPRO) 150 MG tablet TAKE 1 TABLET BY MOUTH  EVERY EVENING.  . magnesium oxide (MAG-OX) 400 MG tablet Take 400 mg by mouth every morning.   . metoprolol tartrate (LOPRESSOR) 25 MG tablet Take 3 tablets (75 mg total) by mouth 2 (two) times daily.  . Multiple Vitamin (MULTIVITAMIN WITH MINERALS) TABS tablet Take 1 tablet by mouth every morning.  . naloxegol oxalate (MOVANTIK) 25 MG TABS tablet Take 25 mg by mouth daily as needed (constipation).   . pantoprazole (PROTONIX) 40 MG tablet Take 1 tablet (40 mg total) by mouth daily.  . pravastatin (PRAVACHOL) 40 MG tablet Take 1 tablet (40 mg total) by mouth daily.  . Tiotropium Bromide Monohydrate (SPIRIVA RESPIMAT) 2.5 MCG/ACT AERS USE 2 INHALATIONS BY MOUTH  DAILY  . vitamin C (ASCORBIC ACID) 500 MG tablet Take 500 mg by mouth every morning.     There were no vitals taken for this visit.  Physical Exam Awake alert and oriented x3 mood affect normal Ortho Exam  Mild tenderness lower left side of her back normal range of motion of the left hip leg lengths were equal  MEDICAL DECISION MAKING  A.  Encounter Diagnosis  Name Primary?  . Chronic left-sided low back pain without sciatica Yes    B. DATA ANALYSED:  ER record  IMAGING: Independent interpretation of images: AP pelvis AP and lateral left hip no fracture seen on the left hip  C. MANAGEMENT   Symptomatic management follow-up as needed  No orders of the defined types were placed in this encounter.     Arther Abbott, MD  09/25/2019 10:10 AM

## 2019-10-01 ENCOUNTER — Ambulatory Visit (HOSPITAL_COMMUNITY)
Admission: RE | Admit: 2019-10-01 | Discharge: 2019-10-01 | Disposition: A | Discharge status: Home / Self Care | Payer: Medicare HMO | Source: Ambulatory Visit | Attending: Cardiovascular Disease | Admitting: Cardiovascular Disease

## 2019-10-01 ENCOUNTER — Other Ambulatory Visit: Payer: Self-pay

## 2019-10-01 DIAGNOSIS — I779 Disorder of arteries and arterioles, unspecified: Secondary | ICD-10-CM

## 2019-10-01 DIAGNOSIS — I6523 Occlusion and stenosis of bilateral carotid arteries: Secondary | ICD-10-CM | POA: Diagnosis present

## 2019-10-01 DIAGNOSIS — R0989 Other specified symptoms and signs involving the circulatory and respiratory systems: Secondary | ICD-10-CM | POA: Diagnosis present

## 2019-10-02 ENCOUNTER — Other Ambulatory Visit: Payer: Self-pay | Admitting: *Deleted

## 2019-10-02 DIAGNOSIS — I779 Disorder of arteries and arterioles, unspecified: Secondary | ICD-10-CM

## 2019-10-02 NOTE — Progress Notes (Signed)
Order placed for 6 month follow up carotid doppler to be done at [facility]

## 2019-11-06 NOTE — Progress Notes (Signed)
Patient ID: [name], female   DOB: [birth year], 79 y.o.   MRN: [id number]     79 y.o. 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 LICA 01-02% duplex 11/01/34 GI bleed 02/20/15 transfused 4 units INR over 10 coumadin changed to eliquis. Had gastric and duodenal ulcers. Chronic lung disease with necrotizing pneumonia moderate COPD sees Ramaschawmy from pulmonary   She seems depressed about her inability to do things and wants to limit diagnostic testing  Still issues with falling Using rolling wheel chair now  Feels her UE tremors are worse  Seen by Dr Tat 12/12/17 ? Decrease dose of amiodarone furtherOr change eliquis to pradaxa and try primidone Also noted left foot drop ? From lumbar radiculopathy  Told not to start Evinity for osteoporosis due to increased risk of MI/Stroke Dr [place] Crane suggested Getting Prolia now   Husband upset about cost of eliquis and wanted 5 mg tabs called in so they could cut them in half Discussed how This is not appropriate and not advised to cut eliquis in half   Has had vaccine for COVID   No complaints Compliant with meds K back to normal suspect lab sample was hemolyzed  Told her to take Lasix PRN   ROS: Denies fever, malais, weight loss, blurry vision, decreased visual acuity, cough, sputum, SOB, hemoptysis, pleuritic pain, palpitaitons, heartburn, abdominal pain, melena, lower extremity edema, claudication, or rash.  All other systems reviewed and negative   General: BP 132/88   Pulse 100   Ht 5\' 2"  (1.575 m)   Wt 116 lb 9.6 oz (52.9 kg)   SpO2 93%   BMI 21.33 kg/m  Affect appropriate Frail thin chronically ill white female  HEENT: normal Neck supple with no adenopathy JVP normal bilateral carotid and subclavian  bruits no thyromegaly Lungs clear with no wheezing and good diaphragmatic motion Heart:  S1/S2 no murmur, no rub, gallop or click PMI normal Abdomen: benighn, BS positve, no  tenderness, no AAA no bruit.  No HSM or HJR Decreased pulse and BP in right arm  No edema Neuro UE tremors left foot drop  Skin bruising over arms  No muscular weakness     Current Outpatient Medications  Medication Sig Dispense Refill  . amLODipine (NORVASC) 5 MG tablet Take 1 tablet (5 mg total) by mouth daily. 90 tablet 3  . calcium-vitamin D (OSCAL) 250-125 MG-UNIT per tablet Take 1 tablet by mouth 2 (two) times daily.      . clopidogrel (PLAVIX) 75 MG tablet Take 1 tablet (75 mg total) by mouth every morning. 90 tablet 3  . denosumab (PROLIA) 60 MG/ML SOSY injection Inject 60 mg into the skin every 6 (six) months.    Marland Kitchen ELIQUIS 2.5 MG TABS tablet TAKE 1 TABLET BY MOUTH  TWICE DAILY 180 tablet 2  . ferrous sulfate 325 (65 FE) MG tablet Take 325 mg by mouth daily with breakfast.    . furosemide (LASIX) 20 MG tablet Take 20 mg by mouth as needed. Only as needed PRN    . irbesartan (AVAPRO) 150 MG tablet TAKE 1 TABLET BY MOUTH  EVERY EVENING. 90 tablet 3  . magnesium oxide (MAG-OX) 400 MG tablet Take 400 mg by mouth every morning.     . metoprolol tartrate (LOPRESSOR) 25 MG tablet Take 3 tablets (75 mg total) by mouth 2 (two) times daily. 540 tablet 3  . Multiple Vitamin (MULTIVITAMIN WITH MINERALS) TABS tablet Take 1 tablet  by mouth every morning.    . naloxegol oxalate (MOVANTIK) 25 MG TABS tablet Take 25 mg by mouth daily as needed (constipation).     . pantoprazole (PROTONIX) 40 MG tablet Take 1 tablet (40 mg total) by mouth daily. 90 tablet 3  . pravastatin (PRAVACHOL) 40 MG tablet Take 1 tablet (40 mg total) by mouth daily. 90 tablet 3  . Tiotropium Bromide Monohydrate (SPIRIVA RESPIMAT) 2.5 MCG/ACT AERS USE 2 INHALATIONS BY MOUTH  DAILY 12 g 3  . vitamin C (ASCORBIC ACID) 500 MG tablet Take 500 mg by mouth every morning.      No current facility-administered medications for this visit.    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 mevacor increased last visit LDL 107 on labs 08/14/19  Stable   HTN:  Continue ARB controlled with right arm BP lower than left due to PVD  Pulmonary:  Quit smoking 11 years ago Gold Class 2 COPD f/u pulmonary   Edema:  No DVT by duplex norvasc decreased stable  Anemia :  History of gastric ulcers continue protonix f/u GI  Lab Results  Component Value Date   HCT 39.9 05/28/2016   CAD:  No chest pain extensive by CT last cath 2011 with no hemodynamically significant disease Continue plavix not on ASA due to GI bleed and need for eliquis for PAF  Tremor:  Discussed changing from eliquis to pradaxa and starting primidone Amiodarone has been d/c She feels tremors are better and does not want to change her meds   F/u with me in 6 months   Carotid in December   Jenkins Rouge

## 2019-11-12 ENCOUNTER — Other Ambulatory Visit: Payer: Self-pay

## 2019-11-12 ENCOUNTER — Encounter (HOSPITAL_COMMUNITY)
Admission: RE | Admit: 2019-11-12 | Discharge: 2019-11-12 | Disposition: A | Discharge status: Home / Self Care | Payer: Medicare HMO | Source: Ambulatory Visit | Attending: Internal Medicine | Admitting: Internal Medicine

## 2019-11-12 DIAGNOSIS — M810 Age-related osteoporosis without current pathological fracture: Secondary | ICD-10-CM | POA: Diagnosis present

## 2019-11-12 MED ORDER — DENOSUMAB 60 MG/ML ~~LOC~~ SOSY
PREFILLED_SYRINGE | SUBCUTANEOUS | Status: AC
  Filled 2019-11-12: qty 1

## 2019-11-12 MED ORDER — DENOSUMAB 60 MG/ML ~~LOC~~ SOSY
60.0000 mg | PREFILLED_SYRINGE | Freq: Once | SUBCUTANEOUS | Status: AC
  Administered 2019-11-12: 60 mg via SUBCUTANEOUS

## 2019-11-18 ENCOUNTER — Other Ambulatory Visit: Payer: Self-pay

## 2019-11-18 ENCOUNTER — Ambulatory Visit (INDEPENDENT_AMBULATORY_CARE_PROVIDER_SITE_OTHER): Payer: Medicare HMO | Admitting: Cardiovascular Disease

## 2019-11-18 ENCOUNTER — Encounter: Payer: Self-pay | Admitting: Cardiovascular Disease

## 2019-11-18 VITALS — BP 132/88 | HR 100 | Ht 62.0 in | Wt 116.6 lb

## 2019-11-18 DIAGNOSIS — E785 Hyperlipidemia, unspecified: Secondary | ICD-10-CM | POA: Diagnosis not present

## 2019-11-18 DIAGNOSIS — I6523 Occlusion and stenosis of bilateral carotid arteries: Secondary | ICD-10-CM | POA: Diagnosis not present

## 2019-11-18 DIAGNOSIS — I2510 Atherosclerotic heart disease of native coronary artery without angina pectoris: Secondary | ICD-10-CM | POA: Diagnosis not present

## 2019-11-18 DIAGNOSIS — I480 Paroxysmal atrial fibrillation: Secondary | ICD-10-CM | POA: Diagnosis not present

## 2019-11-18 NOTE — Patient Instructions (Addendum)
Medication Instructions:  *If you need a refill on your cardiac medications before your next appointment, please call your pharmacy*  Lab Work: If you have labs (blood work) drawn today and your tests are completely normal, you will receive your results only by: Marland Kitchen MyChart Message (if you have MyChart) OR . A paper copy in the mail If you have any lab test that is abnormal or we need to change your treatment, we will call you to review the results.  Testing/Procedures: Your physician has requested that you have a carotid duplex in December at [facility]. This test is an ultrasound of the carotid arteries in your neck. It looks at blood flow through these arteries that supply the brain with blood. Allow one hour for this exam. There are no restrictions or special instructions.  Follow-Up: At [facility], you and your health needs are our priority.  As part of our continuing mission to provide you with exceptional heart care, we have created designated Provider Care Teams.  These Care Teams include your primary Cardiologist (physician) and Advanced Practice Providers (APPs -  Physician Assistants and Nurse Practitioners) who all work together to provide you with the care you need, when you need it.  We recommend signing up for the patient portal called "MyChart".  Sign up information is provided on this After Visit Summary.  MyChart is used to connect with patients for Virtual Visits (Telemedicine).  Patients are able to view lab/test results, encounter notes, upcoming appointments, etc.  Non-urgent messages can be sent to your provider as well.   To learn more about what you can do with MyChart, go to [email or web address].    Your next appointment:   6 month(s)  The format for your next appointment:   In Person  Provider:   You may see Jenkins Rouge, MD or one of the following Advanced Practice Providers on your designated Care Team:    Truitt Merle, NP  Cecilie Kicks, NP  Kathyrn Drown, NP

## 2019-12-22 ENCOUNTER — Encounter: Payer: Self-pay | Admitting: Internal Medicine

## 2019-12-22 ENCOUNTER — Other Ambulatory Visit: Payer: Self-pay

## 2019-12-22 ENCOUNTER — Ambulatory Visit (INDEPENDENT_AMBULATORY_CARE_PROVIDER_SITE_OTHER): Payer: Medicare HMO | Admitting: Internal Medicine

## 2019-12-22 VITALS — BP 130/70 | HR 70 | Temp 96.4°F | Ht 62.0 in | Wt 116.8 lb

## 2019-12-22 DIAGNOSIS — Z7189 Other specified counseling: Secondary | ICD-10-CM | POA: Diagnosis not present

## 2019-12-22 DIAGNOSIS — J449 Chronic obstructive pulmonary disease, unspecified: Secondary | ICD-10-CM | POA: Diagnosis not present

## 2019-12-22 DIAGNOSIS — Z7185 Encounter for immunization safety counseling: Secondary | ICD-10-CM

## 2019-12-22 MED ORDER — SPIRIVA RESPIMAT 2.5 MCG/ACT IN AERS
2.0000 | INHALATION_SPRAY | Freq: Every day | RESPIRATORY_TRACT | 0 refills | Status: DC
Start: 2019-12-22 — End: 2021-05-01

## 2019-12-22 NOTE — Addendum Note (Signed)
Addended by: Vanessa Barbara on: 12/22/2019 01:32 PM   Modules accepted: Orders

## 2019-12-22 NOTE — Patient Instructions (Addendum)
ICD-10-CM   1. COPD, moderate ([place])  J44.9   2. Vaccine counseling  Z71.89      COPD, moderate ([facility])  -Stable COPD  Plan -Continue Spiriva Respimat daily with albuterol as needed  -Take 2 samples of Spiriva Respimat from our office  -In the future if having financial difficulties with Spiriva please call us and we can try to get you a sample temporarily or we can try to change you to DuoNeb through Medicare part B  Vaccine counseling  -Recommend high-dose flu shot when possible [we do not have stock in office] -Also recommend COVID-19 booster third shot with caveat full FDA review pending but emerging evidence suggests elderly high risk most to benefit from covid booster  Follow-up -12 months or sooner if needed follow-up moderate COPD.

## 2019-12-22 NOTE — Progress Notes (Addendum)
OV 11/22/2015  Chief Complaint  Patient presents with  . Follow-up    Pt states that she tries to take the [place] Resp daily - sometimes she forgets. Denies current breathing issues. Pt states that she does not have to use the Albuterol HFA.    79 year old female former smoker with significant neuromuscular disability due to prior stroke, spinal issues.    STUDIES:  CT chest (05/2014) Masslike area of consolidation RLL, r hilar and mediastinal LAN CT chest (06/2014) Significant improvement of RLL opacification, cavitary lesion persists suggestive of resolving PNA with necrosis.  CT chest (09/2014) Near complete collapse of the right lower lobe cavitary lesion with residual pleural-parenchymal thickening. New fine ground-glass nodules in the right middle lobe consistent with pulmonary infection. CT chest (02/15/2015) Recurrent to rounded masslike area of consolidation in the right lower lobe. Given that this area was present previously then resolved and has recurrent quickly, this is most compatible with rounded pneumonia. Small right pleural effusion with adjacent right lower lobe atelectasis or consolidation. Mildly enlarged precarinal lymph node, likely reactive. CT chest 04/13/15 >Interval improvement in masslike consolidation in the right lower lobe, without complete resolution, favoring resolving pneumonia. 2. Locule of air at the lateral base of the right hemi thorax may be within necrotic lung. Bronchopleural fistula cannot be definitively excluded. 3. Small, partially loculated right pleural effusion. 4. 4 mm left lower lobe nodule, stable.    PCP Delphina Cahill, MD REferrd by dr Johnsie Cancel HPI  IOV 07/09/2014  Chief Complaint  Patient presents with  . Pulmonary Consult    Pt referred by [name] fpr abnormal CT. Pt denies SOB, cough, and CP/tightness. Pt stated she went to hospital for pna on 05/20/14.     79 year old female with significant neuromuscular disability due to  prior stroke, spinal issues. Previous greater than 50 pack smoker. Was admitted around 05/20/2014 with significant right lower lobe pneumonia/huge masslike appearance on CT scan of the chest. Apparently she was extensively asymptomatic at this point in time. Treated with antibiotics. She had follow-up CT scan of the chest which shows significant improvement in the right lower lobe opacity but with a residual 5.2 cm thick-walled cavitary lesion. There for she's here for follow-up. At baseline she reports no dyspnea but then she is extremely disabled and is only able to move a little bit with pushing a wheelchair and this is because of a spinal issues. She only rarely feels short of breath and manages with pro-air when necessary. She's never had lung function test in the past. She denies any cough. Currently she feels better     OV 09/29/2014  Chief Complaint  Patient presents with  . Follow-up    Pt here after CT scan. Pt states her breathing is unchanged since last OV. Pt has left ankle edema dt recent sprain, pt is seeing PCP for the issue.     Follow-up   - Right lower lobe lung cavity following pneumonia February 2016: She had CT scan of the chest June 2016. This showed near resolution of the cavity and it is down to a scar tissue. I personally visualized image dated 09/08/2014  - New issue: CT scan of the chest 09/08/2014 compared to CT scans of the chest March 2016 show some new right middle lobe groundglass nodules that are extremely small. She denies she was sick at that time  - Other issue: History of smoking with shortness of breath: She denies any shortness of breath or cough  but on deeper questioning she does admit to occasional shortness of breath that is very rare. She uses pro-air may be once every 2 weeks. She feels good. She's never had pulmonary function testing. She has a history of 56 pack smoking history but is quit. She does not want to pulmonary function test now but will do  it in the future   06/03/2015 Acute OV : Cavitary PNA  Pt presents for work in visit. Was seen 1 month ago . She is recovering from recurrent cavitary PNA on the right.  Previous episode in 05/2014 with right sided mass like consolidation. She was treated with abx. Serial CT showed near complete resolution on CT chest 09/2014.  Admitted in Nov 2016 with acute GI bleed and cavitary lung lesion. EGD showed a normal esophagus and gastritis with numerous clean, linear ulcers of the stomach and a few ulcers of the duodenal bulb. She did stabilize and was transitioned back on Eliquis 5mg  Twice daily   Prior to discharge CT chest showed a recurrent rounded masslike area of consolidation on the right lower lobe measuring 9.5 x 6.7 cm.  She was treated with aggressive ABX . Follow up CT chest in Jan showed interval improvement in RLL consolidation. Clinically she has been slowly improving.  She is very frail and has previous stroke years ago. She is on chronic pain meds as well for back issues.  She denies any swallow issues but does admit she eats in bed often. Says this week has not felt as good  Says that her right side was hurting when she lied down in bed and not as much energy. Feels much better today. CXR today shows further improvement in RLL opacity . No new areas noted.  She denies any bleeding , n/v/d , fever or chest pain. , orthopnea, hemoptysis , or edema.  Has ov with PCP next week for labs.  Last labs last month w/ improved H/H .    OV 07/12/2015  Chief Complaint  Patient presents with  . Follow-up    Pt last seen on 06/03/15 for pna. Pt states she is feeling well now. Pt deines SOB, cough, wheezing, CP/tightness, f/c/s.     Follow-up recurrent pneumonia. Most recently 6 weeks ago she saw nurse practitioner. She says she is now recovered from respiratory infection. She and her husband state that she gets recurrent respiratory infection and is looking at ways to prevent it. Pulmonary  function test today shows gold stage II COPD. CT scan chest as a follow-up from pneumonia 2 years ago shows that things have resolved to scar tissue. There is no lung mass. Overall she's asymptomatic from a respiratory standpoint at baseline but then she is largely disabled because of spinal issues and does not exert much.  CT and PFT were personally visualized  Pulmonary function test 07/08/2015 FEV1 1.44 L/70%. No post bronchodilator response. FVC 2.1 L/76% and a ratio 69 consistent with Gold stage II COPD. Total lung capacity is 98%. DLCO is reduced at 11.33/46%  Ct Chest Wo Contrast  07/11/2015  CLINICAL DATA:  Lung nodules. EXAM: CT CHEST WITHOUT CONTRAST TECHNIQUE: Multidetector CT imaging of the chest was performed following the standard protocol without IV contrast. COMPARISON:  CT chest exams dating back to 01/15/2008. FINDINGS: Mediastinum/Nodes: Sub cm low-attenuation lesion in the right lobe of the thyroid is incidentally noted. Mediastinal lymph nodes are not enlarged by CT size criteria. Hilar regions are difficult to definitively evaluate without IV contrast. No axillary adenopathy.  Surgical clips in the left axilla. Atherosclerotic calcification of the arterial vasculature. Dense calcification and narrowing of the right brachiocephalic artery. Three-vessel coronary artery calcification. Heart size normal. No pericardial effusion. Small hiatal hernia. Calcified subcarinal lymph node. Lungs/Pleura: Right apical pleural parenchymal scarring. Mild centrilobular emphysema. Post infectious scarring in the right middle and right lower lobes. A few scattered pulmonary nodules measure 4 mm or less in size, unchanged. Some are calcified. No pleural fluid. Airway is unremarkable. Upper abdomen: Visualized portions of the liver, adrenal glands unremarkable. Probable renal vascular calcifications bilaterally. Difficult to exclude renal stones as well. Visualized portions of the spleen, pancreas and stomach  are grossly unremarkable. Musculoskeletal: Degenerative changes are seen in the spine. No worrisome lytic or sclerotic lesions. Lower thoracic compression fractures, as before. IMPRESSION: 1. Post infectious scarring in the right middle and right lower lobes. 2. Three-vessel coronary artery calcification. 3. Advanced atherosclerotic calcification and narrowing of the proximal right brachiocephalic artery. Electronically Signed   By: Lorin Picket M.D.   On: 07/11/2015 11:25    OV 11/22/2015  Chief Complaint  Patient presents with  . Follow-up    Pt states that she tries to take the [place] Resp daily - sometimes she forgets. Denies current breathing issues. Pt states that she does not have to use the Albuterol HFA.     Follow-up Gold stage II COPD in the setting of neuromuscular issues due to spine and stroke. Currently on Spiriva.she is here with her husband. Overall she's doing well. She never uses albuterol for rescue since her last visit. No interim exacerbations or urgent care visits. Sleeps well. She's not interested in pulmonary rehabilitation. She continues to Spiriva. She takes flu shot in the fall and is willing to do so again.    has a past medical history of Anemia (05/2014.); Atrial fibrillation ([place]); Breast cancer ([place]); Carotid artery occlusion; Carotid bruit; Degenerative arthritis of spine (2015); Hyperlipidemia; Hypertension; Meningioma ([place]); Osteoporosis; Pneumonia (May 19, 2014); Stroke [facility]); Subclavian steal syndrome; and Ulcers of both lower extremities ([place]) (2015).   reports that she quit smoking about 9 years ago. Her smoking use included Cigarettes. She has a 56.00 pack-year smoking history. She has never used smokeless tobacco.   OV 06/27/2016  Chief Complaint  Patient presents with  . Follow-up    pt states she is doing well at this time, denies any breathing complaints.     Follow-up moderate COPD on single agent Spiriva. Last seen August 2017. She is  here with her husband. In the interim no COPD exacerbations of prednisone use or antibody use or admissions to the hospital emergency room visits for any reason. She is followed up with the gastroenterologist and neurologist for previous histories of atherosclerosis and GI bleeds. She is here with her husband. She did admit that she is not fully compliant with the Spiriva but she stable.   OV 07/11/2017  Chief Complaint  Patient presents with  . Follow-up    1-year follow up.  Pt stated she fell 05/10/17 and was off of her feet x6 weeks. Pt has SOB with exertion. States she has been noncompliant when it comes to using her inhalers or neb machine.     Follow-up moderate COPD on single agent Spiriva and a wheelchair-bound female  There is a 1 year follow-up.  Since her last visit overall stable but earlier this year had hairline fracture in her left tibia.  And was then bedbound and also wheelchair-bound.  She has a brace.  She has had routine cardiology follow-up.  In terms of her respiratory status she is stable.  However she says she missed taking her Spiriva inhalers for many months.  She does get some exertional dyspnea but only when there is too much exertion but for because she is wheelchair-bound and bedbound she does not feel it.  But she does recognize the need to take Spiriva on a regular basis.  She is up-to-date with her vaccines.   Marland Kitchen CAT COPD Symptom & Quality of Life Score (GSK trademark) 0 is no burden. 5 is highest burden 07/11/2017   Never Cough -> Cough all the time 0  No phlegm in chest -> Chest is full of phlegm 0  No chest tightness -> Chest feels very tight 0  No dyspnea for 1 flight stairs/hill -> Very dyspneic for 1 flight of stairs 3  No limitations for ADL at home -> Very limited with ADL at home 5  Confident leaving home -> Not at all confident leaving home 0  Sleep soundly -> Do not sleep soundly because of lung condition 0  Lots of Energy -> No energy at all 3    TOTAL Score (max 40)  11      OV 12/22/2019  Subjective:  Patient ID: Kennon Holter, female , DOB: 1941/01/29 , age 82 y.o. , MRN: [id number] , ADDRESS: [place] [facility] [id number]   12/22/2019 -   Chief Complaint  Patient presents with  . Follow-up    breathing about the same     HPI STORMEY WILBORN 79 y.o. -Personally not seen her in 2 years.  She is on Spiriva.  Last visit was in 2019 with me.  Then in 2020 she saw nurse practitioner.  She continues to do well on Spiriva.  COPD CAT score is 3 and improved.  Her husband is here with her.  They both expressed that Spiriva sometimes can be very expensive when she runs into the donut hole and the cost is few $100.  They asked advice about how to handle this.  They also have questions about whether they should get a COVID-19 booster third shot.  She got vaccine second dose in February 2021 mRNA vaccine.  She also plans to have the flu shot with her primary care physician.  She knows that she needs to get the high-dose flu shot.    CAT Score 12/22/2019  Total CAT Score 3       PFT Results Latest Ref Rng & Units 07/08/2015  FVC-Pre L 2.09  FVC-Predicted Pre % 76  FVC-Post L 1.96  FVC-Predicted Post % 71  Pre FEV1/FVC % % 69  Post FEV1/FCV % % 67  FEV1-Pre L 1.44  FEV1-Predicted Pre % 70  FEV1-Post L 1.31  DLCO uncorrected ml/min/mmHg 11.33  DLCO UNC% % 46  DLVA Predicted % 73  TLC L 4.96  TLC % Predicted % 98  RV % Predicted % 135   CAT Score 12/22/2019  Total CAT Score 3      ROS - per HPI  IMPRESSION: CT  1. Post infectious scarring in the right middle and right lower lobes. 2. Three-vessel coronary artery calcification. 3. Advanced atherosclerotic calcification and narrowing of the proximal right brachiocephalic artery.   Electronically Signed   By: Lorin Picket M.D.   On: 07/11/2015 11:25    IMPRESSION: CXR 1. Chronic post infectious or inflammatory scarring in the right middle lobe  and right lower lobe. 2. Aortic  atherosclerosis.   Electronically Signed   By: Vinnie Langton M.D.   On: 12/19/2018 16:08   has a past medical history of Anemia (05/2014.), Atrial fibrillation ([place]), Breast cancer [facility]), Carotid artery occlusion, Carotid bruit, Degenerative arthritis of spine (2015), Hyperlipidemia, Hypertension, Meningioma ([place]), Osteoporosis, Pneumonia (May 19, 2014), Stroke [facility]), Subclavian steal syndrome, and Ulcers of both lower extremities ([place]) (2015).   reports that she quit smoking about 13 years ago. Her smoking use included cigarettes. She has a 56.00 pack-year smoking history. She has never used smokeless tobacco.  Past Surgical History:  Procedure Laterality Date  . CARDIAC CATHETERIZATION    . CATARACT EXTRACTION Bilateral 2013  . COLONOSCOPY  11/17/2010   Procedure: COLONOSCOPY;  Surgeon: [name], MD;  [facility];  Service: Endoscopy;  Laterality: N/A;  10:45 am  . ESOPHAGOGASTRODUODENOSCOPY  11/17/2010   Procedure: ESOPHAGOGASTRODUODENOSCOPY (EGD);  Surgeon: [name], MD;  [facility];  Service: Endoscopy;  Laterality: N/A;  . ESOPHAGOGASTRODUODENOSCOPY N/A 02/16/2015   Procedure: ESOPHAGOGASTRODUODENOSCOPY (EGD);  Surgeon: [name], MD;  Location: [facility];  Service: Gastroenterology;  Laterality: N/A;  . EXPLORATORY LAPAROTOMY  1960s   For peritonitis of undetermined cause  . EYE SURGERY    . LUMBAR EPIDURAL INJECTION  06-2012--06-2013   pt. states she has had 5 epidurals in 06-2012----06-2013  . MASTECTOMY Left 1990  . YAG LASER APPLICATION Right 2/69/4854   Procedure: YAG LASER APPLICATION;  Surgeon: [name], MD;  Location: [facility];  Service: Ophthalmology;  Laterality: Right;  right  . YAG LASER APPLICATION Left 10/03/348   Procedure: YAG LASER APPLICATION;  Surgeon: [name], MD;  Location: [facility];  Service: Ophthalmology;  Laterality: Left;    Allergies  Allergen  Reactions  . Iodine Rash and Other (See Comments)    BETADINE Rash/burning, blisters on skin. Burns skin  . Iohexol Rash and Other (See Comments)    Blisters; PT NEEDS 13-HOUR PREP   . Povidone-Iodine Other (See Comments)    Burns skin  . Tape Itching, Rash and Other (See Comments)    Prefers PAPER TAPE Burns skin    Immunization History  Administered Date(s) Administered  . Influenza Split 01/07/2014  . Influenza, High Dose Seasonal PF 01/28/2016, 01/07/2017, 12/17/2018  . Influenza,inj,Quad PF,6+ Mos 01/12/2015  . Influenza-Unspecified 01/07/2013  . Moderna SARS-COVID-2 Vaccination 05/02/2019, 06/01/2019  . Pneumococcal Conjugate-13 01/07/2014  . Pneumococcal Polysaccharide-23 01/07/2017  . Tdap 08/01/2011  . Zoster Recombinat (Shingrix) 01/10/2017    Family History  Problem Relation Age of Onset  . Alzheimer's disease Mother   . Dementia Mother   . Hypertension Mother   . Hyperlipidemia Mother   . Parkinsonism Father      Current Outpatient Medications:  .  amLODipine (NORVASC) 5 MG tablet, Take 1 tablet (5 mg total) by mouth daily., Disp: 90 tablet, Rfl: 3 .  calcium-vitamin D (OSCAL) 250-125 MG-UNIT per tablet, Take 1 tablet by mouth 2 (two) times daily.  , Disp: , Rfl:  .  clopidogrel (PLAVIX) 75 MG tablet, Take 1 tablet (75 mg total) by mouth every morning., Disp: 90 tablet, Rfl: 3 .  denosumab (PROLIA) 60 MG/ML SOSY injection, Inject 60 mg into the skin every 6 (six) months., Disp: , Rfl:  .  ELIQUIS 2.5 MG TABS tablet, TAKE 1 TABLET BY MOUTH  TWICE DAILY, Disp: 180 tablet, Rfl: 2 .  ferrous sulfate 325 (65 FE) MG tablet, Take 325 mg by mouth daily with breakfast., Disp: ,  Rfl:  .  furosemide (LASIX) 20 MG tablet, Take 20 mg by mouth as needed. Only as needed PRN, Disp: , Rfl:  .  irbesartan (AVAPRO) 150 MG tablet, TAKE 1 TABLET BY MOUTH  EVERY EVENING., Disp: 90 tablet, Rfl: 3 .  magnesium oxide (MAG-OX) 400 MG tablet, Take 400 mg by mouth every morning. ,  Disp: , Rfl:  .  metoprolol tartrate (LOPRESSOR) 25 MG tablet, Take 3 tablets (75 mg total) by mouth 2 (two) times daily., Disp: 540 tablet, Rfl: 3 .  Multiple Vitamin (MULTIVITAMIN WITH MINERALS) TABS tablet, Take 1 tablet by mouth every morning., Disp: , Rfl:  .  naloxegol oxalate (MOVANTIK) 25 MG TABS tablet, Take 25 mg by mouth daily as needed (constipation). , Disp: , Rfl:  .  pantoprazole (PROTONIX) 40 MG tablet, Take 1 tablet (40 mg total) by mouth daily., Disp: 90 tablet, Rfl: 3 .  pravastatin (PRAVACHOL) 40 MG tablet, Take 1 tablet (40 mg total) by mouth daily., Disp: 90 tablet, Rfl: 3 .  Tiotropium Bromide Monohydrate (SPIRIVA RESPIMAT) 2.5 MCG/ACT AERS, USE 2 INHALATIONS BY MOUTH  DAILY, Disp: 12 g, Rfl: 3 .  vitamin C (ASCORBIC ACID) 500 MG tablet, Take 500 mg by mouth every morning. , Disp: , Rfl:       Objective:   Vitals:   12/22/19 1041  BP: 130/70  Pulse: 70  Temp: (!) 96.4 F (35.8 C)  SpO2: 97%  Weight: 116 lb 12.8 oz (53 kg)  Height: 5\' 2"  (1.575 m)    Estimated body mass index is 21.36 kg/m as calculated from the following:   Height as of this encounter: 5\' 2"  (1.575 m).   Weight as of this encounter: 116 lb 12.8 oz (53 kg).  @WEIGHTCHANGE @  Filed Weights   12/22/19 1041  Weight: 116 lb 12.8 oz (53 kg)     Physical Exam thin pleasant female.  Sitting on the bed.  Her wheelchair is next to her.  She has some chronic bruises on her forearms.  She has some crackles in her right base but otherwise stable clear lung entry.  Abdomen soft.  Normal heart sounds.  Alert and oriented x3.  No neck nodes no elevated JVP.        Assessment:       ICD-10-CM   1. COPD, moderate ([place])  J44.9   2. Vaccine counseling  Z71.89        Plan:     Patient Instructions     ICD-10-CM   1. COPD, moderate ([place])  J44.9   2. Vaccine counseling  Z71.89      COPD, moderate ([facility])  -Stable COPD  Plan -Continue Spiriva Respimat daily with albuterol as needed  -Take  2 samples of Spiriva Respimat from our office  -In the future if having financial difficulties with Spiriva please call us and we can try to get you a sample temporarily or we can try to change you to DuoNeb through Medicare part B  Vaccine counseling  -Recommend high-dose flu shot when possible [we do not have stock in office] -Also recommend COVID-19 booster third shot with caveat full FDA review pending but emerging evidence suggests elderly high risk most to benefit from covid booster  Follow-up -12 months or sooner if needed follow-up moderate COPD.       SIGNATURE    Dr. Brand Males, M.D., F.C.C.P,  Pulmonary and Critical Care Medicine Staff Physician, [facility] Director - Interstitial Lung Disease  Program  [place] [facility] [place], [place], [address]  Pager: [phone], If no answer or between  15:00h - 7:00h: call [phone] Telephone: [phone]  11:29 AM 12/22/2019

## 2020-02-17 ENCOUNTER — Telehealth: Payer: Self-pay | Admitting: Internal Medicine

## 2020-02-17 NOTE — Telephone Encounter (Signed)
Duplicate message, see patient's husband's chart.  Closing encounter as this is not the wife's medication.

## 2020-02-25 DIAGNOSIS — Z23 Encounter for immunization: Secondary | ICD-10-CM | POA: Diagnosis not present

## 2020-03-01 ENCOUNTER — Encounter: Payer: Self-pay | Admitting: Orthopedic Surgery

## 2020-03-01 ENCOUNTER — Ambulatory Visit: Payer: Medicare HMO

## 2020-03-01 ENCOUNTER — Ambulatory Visit (INDEPENDENT_AMBULATORY_CARE_PROVIDER_SITE_OTHER): Payer: Medicare HMO | Admitting: Orthopedic Surgery

## 2020-03-01 ENCOUNTER — Other Ambulatory Visit: Payer: Self-pay

## 2020-03-01 VITALS — BP 136/80 | HR 89 | Ht 62.0 in | Wt 116.0 lb

## 2020-03-01 DIAGNOSIS — G8929 Other chronic pain: Secondary | ICD-10-CM

## 2020-03-01 DIAGNOSIS — M25562 Pain in left knee: Secondary | ICD-10-CM | POA: Diagnosis not present

## 2020-03-01 NOTE — Patient Instructions (Signed)
Knee immobilizer on knee when up and walking  F/u 2-3 weeks for repeat evaluation  Medications as needed

## 2020-03-01 NOTE — Progress Notes (Signed)
New Patient Visit  Assessment: [name] is a 79 y.o. female with the following: 1. Acute pain of left knee; possible meniscus injury vs occult fracture   Plan: [name] is having left knee pain after falling directly onto her knee, with a possible twisting event.  The pain and swelling she has in her knee currently came on gradually after the fall.  She is diffusely tender to palpation on exam today, and she is exhibiting significant guarding.  She does have a significant effusion in her left knee, but range of motion does not appear to be her issue today.  We briefly discussed the possibility of aspirating some of this fluid, but she declined in clinic.  I reviewed the x-rays with the patient, which demonstrates no obvious injury.  As such, I recommended she wear a knee immobilizer while attempting to ambulate for the next 2-3 weeks.  She can use ice or heat, depending on which makes the knee feel better.  Hopefully her pain will significantly improve and we can obtain a better evaluation at her follow-up appointment.  Medications as needed.  Weightbearing as tolerated.   Follow-up: Return in about 3 weeks (around 03/22/2020) for 2-3 weeks.  Subjective:  Chief Complaint  Patient presents with  . Knee Pain    left knee pain, was walking last wednesday, and had a fall on left knee.     History of Present Illness: ELLERY [name] is a 79 y.o. female who presents for evaluation of her left knee.  She states that she fell directly onto her left knee, approximately 1 week ago.  She had some minor discomfort at that time, but in the subsequent days, the knee pain and swelling significantly worsened.  She has had difficulty ambulating due to the pain in her left knee.  She was using a wheelchair as a walker at baseline, and has continued to do so since sustaining the injury.  She is never injured her left knee before.  She does have some significant back pathology, which is likely contributed to  decreased function in her left foot.  She feels that this has contributed to her fall.   Review of Systems: No fevers or chills No numbness or tingling No chest pain No shortness of breath No bowel or bladder dysfunction No GI distress No headaches   Medical History:  Past Medical History:  Diagnosis Date  . Anemia 05/2014.  Marland Kitchen Atrial fibrillation ([place])    x 3 yrs  . Breast cancer ([place])    S/P left mastectomy and chemotherapy 1989 remained in remission  . Carotid artery occlusion   . Carotid bruit    LICA 65-03% (duplex 5/46)  . Degenerative arthritis of spine 2015   Chronic back pain  . Hyperlipidemia   . Hypertension   . Meningioma ([place])   . Osteoporosis   . Pneumonia May 19, 2014  . Stroke [facility])    CVA 2008  . Subclavian steal syndrome   . Ulcers of both lower extremities ([place]) 2015    Past Surgical History:  Procedure Laterality Date  . CARDIAC CATHETERIZATION    . CATARACT EXTRACTION Bilateral 2013  . COLONOSCOPY  11/17/2010   Procedure: COLONOSCOPY;  Surgeon: [name], MD;  [facility];  Service: Endoscopy;  Laterality: N/A;  10:45 am  . ESOPHAGOGASTRODUODENOSCOPY  11/17/2010   Procedure: ESOPHAGOGASTRODUODENOSCOPY (EGD);  Surgeon: [name], MD;  [facility];  Service: Endoscopy;  Laterality: N/A;  .  ESOPHAGOGASTRODUODENOSCOPY N/A 02/16/2015   Procedure: ESOPHAGOGASTRODUODENOSCOPY (EGD);  Surgeon: [name], MD;  Location: [place];  Service: Gastroenterology;  Laterality: N/A;  . EXPLORATORY LAPAROTOMY  1960s   For peritonitis of undetermined cause  . EYE SURGERY    . LUMBAR EPIDURAL INJECTION  06-2012--06-2013   pt. states she has had 5 epidurals in 06-2012----06-2013  . MASTECTOMY Left 1990  . YAG LASER APPLICATION Right 0/25/8527   Procedure: YAG LASER APPLICATION;  Surgeon: [name], MD;  Location: [facility];  Service: Ophthalmology;  Laterality: Right;  right  . YAG LASER APPLICATION Left  7/82/4235   Procedure: YAG LASER APPLICATION;  Surgeon: [name], MD;  Location: [facility];  Service: Ophthalmology;  Laterality: Left;    Family History  Problem Relation Age of Onset  . Alzheimer's disease Mother   . Dementia Mother   . Hypertension Mother   . Hyperlipidemia Mother   . Parkinsonism Father    Social History   Tobacco Use  . Smoking status: Former Smoker    Packs/day: 1.00    Years: 56.00    Pack years: 56.00    Types: Cigarettes    Quit date: 08/14/2006    Years since quitting: 13.5  . Smokeless tobacco: Never Used  . Tobacco comment: quit in 2008  Vaping Use  . Vaping Use: Never used  Substance Use Topics  . Alcohol use: No    Alcohol/week: 0.0 standard drinks  . Drug use: No    Allergies  Allergen Reactions  . Iodine Rash and Other (See Comments)    BETADINE Rash/burning, blisters on skin. Burns skin  . Iohexol Rash and Other (See Comments)    Blisters; PT NEEDS 13-HOUR PREP   . Povidone-Iodine Other (See Comments)    Burns skin  . Tape Itching, Rash and Other (See Comments)    Prefers PAPER TAPE Burns skin    Current Meds  Medication Sig  . amLODipine (NORVASC) 5 MG tablet Take 1 tablet (5 mg total) by mouth daily.  . calcium-vitamin D (OSCAL) 250-125 MG-UNIT per tablet Take 1 tablet by mouth 2 (two) times daily.    . clopidogrel (PLAVIX) 75 MG tablet Take 1 tablet (75 mg total) by mouth every morning.  . denosumab (PROLIA) 60 MG/ML SOSY injection Inject 60 mg into the skin every 6 (six) months.  Marland Kitchen ELIQUIS 2.5 MG TABS tablet TAKE 1 TABLET BY MOUTH  TWICE DAILY  . ferrous sulfate 325 (65 FE) MG tablet Take 325 mg by mouth daily with breakfast.  . furosemide (LASIX) 20 MG tablet Take 20 mg by mouth as needed. Only as needed PRN  . irbesartan (AVAPRO) 150 MG tablet TAKE 1 TABLET BY MOUTH  EVERY EVENING.  . magnesium oxide (MAG-OX) 400 MG tablet Take 400 mg by mouth every morning.   . metoprolol tartrate (LOPRESSOR) 25 MG tablet Take 3  tablets (75 mg total) by mouth 2 (two) times daily.  . Multiple Vitamin (MULTIVITAMIN WITH MINERALS) TABS tablet Take 1 tablet by mouth every morning.  . naloxegol oxalate (MOVANTIK) 25 MG TABS tablet Take 25 mg by mouth daily as needed (constipation).   . pantoprazole (PROTONIX) 40 MG tablet Take 1 tablet (40 mg total) by mouth daily.  . pravastatin (PRAVACHOL) 40 MG tablet Take 1 tablet (40 mg total) by mouth daily.  . Tiotropium Bromide Monohydrate (SPIRIVA RESPIMAT) 2.5 MCG/ACT AERS USE 2 INHALATIONS BY MOUTH  DAILY  . Tiotropium Bromide Monohydrate (SPIRIVA RESPIMAT) 2.5 MCG/ACT AERS Inhale 2  puffs into the lungs daily.  . vitamin C (ASCORBIC ACID) 500 MG tablet Take 500 mg by mouth every morning.     Objective: BP 136/80   Pulse 89   Ht 5\' 2"  (1.575 m)   Wt 116 lb (52.6 kg)   BMI 21.22 kg/m   Physical Exam:  General: Elderly female, no acute distress Gait: Unable to ambulate without assistance.  Evaluation left knee demonstrates a significant effusion with ecchymosis over the anterior, and posterior aspect of her knee.  She is able to maintain a straight leg raise.  She does have diffuse tenderness to palpation over the anterior aspect of the knee.  The quadriceps and patellar tendons are both intact and palpable.  Negative Lachman, although there is significant guarding.  No increased laxity to varus or valgus stress at 0 or 30 degrees, although there is significant guarding.    IMAGING: I personally ordered and reviewed the following images   X-rays of the left knee were obtained in clinic today and demonstrates neutral overall alignment.  There is an effusion within the knee.  No evidence of an acute injury.  The tibial plateau remains intact without obvious displacement.  Well-maintained joint space.  Impression: Normal appearing left knee   New Medications:  No orders of the defined types were placed in this encounter.     Mordecai Rasmussen, MD  03/01/2020 1:54  PM

## 2020-03-14 ENCOUNTER — Other Ambulatory Visit: Payer: Self-pay

## 2020-03-14 ENCOUNTER — Ambulatory Visit (HOSPITAL_COMMUNITY)
Admission: RE | Admit: 2020-03-14 | Discharge: 2020-03-14 | Disposition: A | Discharge status: Home / Self Care | Payer: Medicare HMO | Source: Ambulatory Visit | Attending: Cardiovascular Disease | Admitting: Cardiovascular Disease

## 2020-03-14 DIAGNOSIS — I2510 Atherosclerotic heart disease of native coronary artery without angina pectoris: Secondary | ICD-10-CM | POA: Diagnosis present

## 2020-03-14 DIAGNOSIS — I480 Paroxysmal atrial fibrillation: Secondary | ICD-10-CM

## 2020-03-14 DIAGNOSIS — E785 Hyperlipidemia, unspecified: Secondary | ICD-10-CM

## 2020-03-14 DIAGNOSIS — I6523 Occlusion and stenosis of bilateral carotid arteries: Secondary | ICD-10-CM

## 2020-03-22 ENCOUNTER — Ambulatory Visit: Payer: Medicare HMO | Admitting: Orthopedic Surgery

## 2020-03-25 ENCOUNTER — Encounter: Payer: Self-pay | Admitting: Orthopedic Surgery

## 2020-03-25 ENCOUNTER — Ambulatory Visit (INDEPENDENT_AMBULATORY_CARE_PROVIDER_SITE_OTHER): Payer: Medicare HMO | Admitting: Orthopedic Surgery

## 2020-03-25 ENCOUNTER — Other Ambulatory Visit: Payer: Self-pay

## 2020-03-25 VITALS — Ht 62.0 in | Wt 109.0 lb

## 2020-03-25 DIAGNOSIS — M25562 Pain in left knee: Secondary | ICD-10-CM | POA: Diagnosis not present

## 2020-03-25 NOTE — Progress Notes (Signed)
Orthopaedic Clinic Return  Assessment: [name] is a 79 y.o. female with the following: Left knee pain, possible occult fracture for small meniscus injury.  Plan: [name] return to clinic today and her knee is doing much better.  She use the knee immobilizer, and kept her leg straight for about a week.  Since then, the swelling significantly improved as has her pain.  She is now ambulating with the assistance of a walker, as well as she was before she hurt her knee.  No need for further investigation at this time.  She can continue with her current level of activity.  If she has any issues in the future, have asked her to contact the clinic for follow-up evaluation.    Body mass index is 19.94 kg/m.  Follow-up: Return if symptoms worsen or fail to improve.   Subjective:  Chief Complaint  Patient presents with  . Knee Pain    L/feeling much better.    History of Present Illness: [name] is a 79 y.o. female who presents for repeat evaluation of her left knee.  She injured her knee approximately 3-4 weeks ago.  At that time, she had significant pain and swelling in her left knee.  X-rays were negative.  We sent her home with a knee immobilizer, and asked her to come back for repeat evaluation, so we could get a better evaluation of her left knee.  She returns to clinic today with no pain in her left knee.  She has near full range of motion.  Her knee swelling has significantly improved.  She states that the knee is a little bit warm, but otherwise she feels very good.  According to her husband, she is walking as well she was prior to injuring her knee.  Review of Systems: No fevers or chills No numbness or tingling No chest pain No shortness of breath No bowel or bladder dysfunction No GI distress No headaches    Objective: Ht 5\' 2"  (1.575 m)   Wt 109 lb (49.4 kg)   BMI 19.94 kg/m   Physical Exam:  Ambulates with the assistance of a walker.  No obvious  limp.  Evaluation of left knee demonstrates a significantly improved effusion.  The knee is almost the same size as the contralateral side.  No tenderness to palpation.  She has full extension, with flexion greater than 120 degrees without discomfort.  No ecchymosis.  IMAGING: I personally ordered and reviewed the following images:  New imaging obtained.  Mordecai Rasmussen, MD 03/25/2020 9:29 AM

## 2020-04-26 ENCOUNTER — Ambulatory Visit (HOSPITAL_COMMUNITY): Payer: Medicare HMO

## 2020-05-09 ENCOUNTER — Telehealth: Payer: Self-pay | Admitting: Cardiovascular Disease

## 2020-05-09 MED ORDER — APIXABAN 2.5 MG PO TABS: 2.5000 mg | ORAL_TABLET | Freq: Two times a day (BID) | ORAL | 1 refills | Status: DC

## 2020-05-09 NOTE — Telephone Encounter (Signed)
Pt last saw Dr Johnsie Cancel 11/18/19, last labs 10/26/19 Creat 1.03, age 80, weight 49.4kg, based on specified criteria pt is on appropriate dosage of Eliquis 2.5mg  BID for afib.  Will refill rx.

## 2020-05-09 NOTE — Telephone Encounter (Signed)
*  STAT* If patient is at the pharmacy, call can be transferred to refill team.   1. Which medications need to be refilled? (please list name of each medication and dose if known) ELIQUIS 2.5 MG TABS tablet  2. Which pharmacy/location (including street and city if local pharmacy) is medication to be sent to? [facility]  3. Do they need a 30 day or 90 day supply? 90 day  Requesting a years worth of refills.

## 2020-05-16 ENCOUNTER — Encounter (HOSPITAL_COMMUNITY): Admission: RE | Admit: 2020-05-16 | Payer: Medicare HMO | Source: Ambulatory Visit

## 2020-05-24 NOTE — Progress Notes (Signed)
Patient ID: [name], female   DOB: 18-Jan-1941, 80 y.o.   MRN: [id number]     80 y.o. 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 LICA 01-09% duplex 06/29/53 GI bleed 02/20/15 transfused 4 units INR over 10 coumadin changed to eliquis. Had gastric and duodenal ulcers. Chronic lung disease with necrotizing pneumonia moderate COPD sees Ramaschawmy from pulmonary   Still issues with falling Using rolling wheel chair now  Feels her UE tremors are worse  Seen by Dr Tat 12/12/17 ? Decrease dose of amiodarone furtherOr change eliquis to pradaxa and try primidone Also noted left foot drop ? From lumbar radiculopathy  Told not to start Evinity for osteoporosis due to increased risk of MI/Stroke Dr Nevada Crane suggested Getting Prolia now   Husband upset about cost of eliquis and wanted 5 mg tabs called in so they could cut them in half Discussed how This is not appropriate and not advised to cut eliquis in half   Has had vaccine for COVID   No complaints Compliant with meds K back to normal suspect lab sample was hemolyzed  Told her to take Lasix PRN   K is up a bit at 5.4 Cr 1.23 Discussed holding ARB and repeating labs in 8 weeks GFR 29ml/min I don't think she needs to see renal at this time   ROS: Denies fever, malais, weight loss, blurry vision, decreased visual acuity, cough, sputum, SOB, hemoptysis, pleuritic pain, palpitaitons, heartburn, abdominal pain, melena, lower extremity edema, claudication, or rash.  All other systems reviewed and negative   General: BP 124/82   Pulse 60   Ht 5\' 2"  (1.575 m)   Wt 53.5 kg   SpO2 98%   BMI 21.58 kg/m  Affect appropriate Frail thin chronically ill white female  HEENT: normal Neck supple with no adenopathy JVP normal bilateral carotid and subclavian  bruits no thyromegaly Lungs clear with no wheezing and good diaphragmatic motion Heart:  S1/S2 no murmur, no rub, gallop or click PMI  normal Abdomen: benighn, BS positve, no tenderness, no AAA no bruit.  No HSM or HJR Decreased pulse and BP in right arm  No edema Neuro UE tremors left foot drop  Skin bruising over arms  No muscular weakness     Current Outpatient Medications  Medication Sig Dispense Refill  . amLODipine (NORVASC) 5 MG tablet Take 1 tablet (5 mg total) by mouth daily. 90 tablet 3  . apixaban (ELIQUIS) 2.5 MG TABS tablet Take 1 tablet (2.5 mg total) by mouth 2 (two) times daily. 180 tablet 1  . calcium-vitamin D (OSCAL) 250-125 MG-UNIT per tablet Take 1 tablet by mouth 2 (two) times daily.    . clopidogrel (PLAVIX) 75 MG tablet Take 1 tablet (75 mg total) by mouth every morning. 90 tablet 3  . denosumab (PROLIA) 60 MG/ML SOSY injection Inject 60 mg into the skin every 6 (six) months.    . ferrous sulfate 325 (65 FE) MG tablet Take 325 mg by mouth daily with breakfast.    . furosemide (LASIX) 20 MG tablet Take 20 mg by mouth as needed. Only as needed PRN    . irbesartan (AVAPRO) 150 MG tablet TAKE 1 TABLET BY MOUTH  EVERY EVENING. 90 tablet 3  . magnesium oxide (MAG-OX) 400 MG tablet Take 400 mg by mouth every morning.     . metoprolol tartrate (LOPRESSOR) 25 MG tablet Take 3 tablets (75 mg total) by mouth 2 (two) times  daily. 540 tablet 3  . Multiple Vitamin (MULTIVITAMIN WITH MINERALS) TABS tablet Take 1 tablet by mouth every morning.    . naloxegol oxalate (MOVANTIK) 25 MG TABS tablet Take 25 mg by mouth daily as needed (constipation).     . pantoprazole (PROTONIX) 40 MG tablet Take 1 tablet (40 mg total) by mouth daily. 90 tablet 3  . pravastatin (PRAVACHOL) 40 MG tablet Take 1 tablet (40 mg total) by mouth daily. 90 tablet 3  . Tiotropium Bromide Monohydrate (SPIRIVA RESPIMAT) 2.5 MCG/ACT AERS USE 2 INHALATIONS BY MOUTH  DAILY 12 g 3  . Tiotropium Bromide Monohydrate (SPIRIVA RESPIMAT) 2.5 MCG/ACT AERS Inhale 2 puffs into the lungs daily. 4 g 0  . vitamin C (ASCORBIC ACID) 500 MG tablet Take 500 mg  by mouth every morning.      No current facility-administered medications for this visit.    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 mevacor increased last visit LDL 107 on labs 08/14/19  Stable   HTN:  Hold ARB due to elevated CR/K repeat labs in 8 weeks Dr [name] office  Pulmonary:  Quit smoking 11 years ago Gold Class 2 COPD f/u pulmonary   Edema:  No DVT by duplex norvasc decreased stable  Anemia :  History of gastric ulcers continue protonix f/u GI  Lab Results  Component Value Date   HCT 39.9 05/28/2016   CAD:  No chest pain extensive by CT last cath 2011 with no hemodynamically significant disease Continue plavix not on ASA due to GI bleed and need for eliquis for PAF  Tremor:  Discussed changing from eliquis to pradaxa and starting primidone Amiodarone has been d/c She feels tremors are better and does not want to change her meds   F/u with me in 6 months   BMET Dr [name] office 8 weeks D/C Avapro   Jenkins Rouge

## 2020-05-27 ENCOUNTER — Encounter (HOSPITAL_COMMUNITY)
Admission: RE | Admit: 2020-05-27 | Discharge: 2020-05-27 | Disposition: A | Discharge status: Home / Self Care | Payer: Medicare HMO | Source: Ambulatory Visit | Attending: Internal Medicine | Admitting: Internal Medicine

## 2020-05-27 ENCOUNTER — Other Ambulatory Visit: Payer: Self-pay

## 2020-05-27 DIAGNOSIS — M810 Age-related osteoporosis without current pathological fracture: Secondary | ICD-10-CM | POA: Insufficient documentation

## 2020-05-27 MED ORDER — DENOSUMAB 60 MG/ML ~~LOC~~ SOSY
PREFILLED_SYRINGE | SUBCUTANEOUS | Status: AC
  Filled 2020-05-27: qty 1

## 2020-05-27 MED ORDER — DENOSUMAB 60 MG/ML ~~LOC~~ SOSY
60.0000 mg | PREFILLED_SYRINGE | Freq: Once | SUBCUTANEOUS | Status: AC
  Administered 2020-05-27: 60 mg via SUBCUTANEOUS

## 2020-05-31 ENCOUNTER — Ambulatory Visit (INDEPENDENT_AMBULATORY_CARE_PROVIDER_SITE_OTHER): Payer: Medicare HMO | Admitting: Cardiovascular Disease

## 2020-05-31 ENCOUNTER — Other Ambulatory Visit: Payer: Self-pay

## 2020-05-31 ENCOUNTER — Encounter: Payer: Self-pay | Admitting: Cardiovascular Disease

## 2020-05-31 VITALS — BP 124/82 | HR 60 | Ht 62.0 in | Wt 118.0 lb

## 2020-05-31 DIAGNOSIS — I10 Essential (primary) hypertension: Secondary | ICD-10-CM

## 2020-05-31 DIAGNOSIS — I480 Paroxysmal atrial fibrillation: Secondary | ICD-10-CM | POA: Diagnosis not present

## 2020-05-31 DIAGNOSIS — E782 Mixed hyperlipidemia: Secondary | ICD-10-CM

## 2020-05-31 DIAGNOSIS — E875 Hyperkalemia: Secondary | ICD-10-CM | POA: Diagnosis not present

## 2020-05-31 NOTE — Patient Instructions (Signed)
Medication Instructions:  STOP IRBESARTAN *If you need a refill on your cardiac medications before your next appointment, please call your pharmacy*   Lab Work:  [facility] If you have labs (blood work) drawn today and your tests are completely normal, you will receive your results only by: Marland Kitchen MyChart Message (if you have MyChart) OR . A paper copy in the mail If you have any lab test that is abnormal or we need to change your treatment, we will call you to review the results.   Testing/Procedures: NONE   Follow-Up: At [facility], you and your health needs are our priority.  As part of our continuing mission to provide you with exceptional heart care, we have created designated Provider Care Teams.  These Care Teams include your primary Cardiologist (physician) and Advanced Practice Providers (APPs -  Physician Assistants and Nurse Practitioners) who all work together to provide you with the care you need, when you need it.  We recommend signing up for the patient portal called "MyChart".  Sign up information is provided on this After Visit Summary.  MyChart is used to connect with patients for Virtual Visits (Telemedicine).  Patients are able to view lab/test results, encounter notes, upcoming appointments, etc.  Non-urgent messages can be sent to your provider as well.   To learn more about what you can do with MyChart, go to [email or web address].    Your next appointment:   6 month(s)  The format for your next appointment:   In Person  Provider:   Jenkins Rouge, MD

## 2020-06-03 ENCOUNTER — Other Ambulatory Visit (HOSPITAL_COMMUNITY): Payer: Self-pay | Admitting: Family Medicine

## 2020-06-03 DIAGNOSIS — M810 Age-related osteoporosis without current pathological fracture: Secondary | ICD-10-CM

## 2020-06-16 ENCOUNTER — Ambulatory Visit (HOSPITAL_COMMUNITY)
Admission: RE | Admit: 2020-06-16 | Discharge: 2020-06-16 | Disposition: A | Discharge status: Home / Self Care | Payer: Medicare HMO | Source: Ambulatory Visit | Attending: Family Medicine | Admitting: Family Medicine

## 2020-06-16 DIAGNOSIS — M810 Age-related osteoporosis without current pathological fracture: Secondary | ICD-10-CM | POA: Diagnosis not present

## 2020-06-24 ENCOUNTER — Other Ambulatory Visit: Payer: Self-pay | Admitting: Cardiovascular Disease

## 2020-07-26 ENCOUNTER — Telehealth: Payer: Self-pay | Admitting: Cardiovascular Disease

## 2020-07-26 DIAGNOSIS — I10 Essential (primary) hypertension: Secondary | ICD-10-CM

## 2020-07-26 NOTE — Telephone Encounter (Signed)
    Pt said [name] wants her to get blood work, she said to send the order to Dr. [name] office so it is closer to her

## 2020-07-27 NOTE — Telephone Encounter (Signed)
Late entry for 07/26/20 Spoke to patient about labs and asked her if she could go to [facility] in [place] and have them draw her labs.  Patient would like to go to her PCP, Dr. Nevada Crane.  Lab orders were entered for [facility] and order requisition sent to Dr. [name] office by fax.  Patient requested date for lab draw was August 02, 2020

## 2020-07-28 ENCOUNTER — Other Ambulatory Visit: Payer: Self-pay

## 2020-07-28 MED ORDER — AMLODIPINE BESYLATE 5 MG PO TABS: 5.0000 mg | ORAL_TABLET | Freq: Every day | ORAL | 3 refills | Status: DC

## 2020-07-29 NOTE — Telephone Encounter (Signed)
Lab work has been refaxed to Dr. Nevada Crane.  Spoke with the patient who states that Dr. [name] office got back in touch with her and they have received the orders.

## 2020-07-29 NOTE — Telephone Encounter (Signed)
Pt called in and stated that Dr Nevada Crane office has not rec'd the blood work orders .  She would like to know if they can be re faxed to [phone]

## 2020-08-08 ENCOUNTER — Telehealth: Payer: Self-pay | Admitting: *Deleted

## 2020-08-08 DIAGNOSIS — E875 Hyperkalemia: Secondary | ICD-10-CM

## 2020-08-08 NOTE — Telephone Encounter (Signed)
Spoke with pt re labs from 08/04/20 K was 5.6 and in Jan it was 5.4 Discussed with Dr Johnsie Cancel pt should recheck in 3 months and make sure pt has stopped Irbesartan.Spoke with pt and pt's husband is aware to recheck labs in 3 months and pt has stopped Irbesartan and will decrease intake of K rich foods ./cy

## 2020-08-18 ENCOUNTER — Other Ambulatory Visit: Payer: Self-pay

## 2020-08-18 MED ORDER — CLOPIDOGREL BISULFATE 75 MG PO TABS: 75.0000 mg | ORAL_TABLET | Freq: Every morning | ORAL | 3 refills | Status: DC

## 2020-09-22 ENCOUNTER — Other Ambulatory Visit: Payer: Self-pay | Admitting: Cardiovascular Disease

## 2020-10-24 ENCOUNTER — Telehealth: Payer: Self-pay | Admitting: Cardiovascular Disease

## 2020-10-24 NOTE — Telephone Encounter (Signed)
Patient states she had lab work with Dr. Wende Neighbors, MD (PCP) today and she is hoping [name] can review the results. She states Dr. Kyla Balzarine nurse will have to contact their office requesting the results.  Patient provided a phone/fax # for their office:  Fax#: [phone] Phone#: [phone] (ask for lab tech, Maudie Mercury)

## 2020-10-25 NOTE — Telephone Encounter (Signed)
Called patient's PCP and left message for their office to fax [place] a copy of her lab work.

## 2020-11-10 ENCOUNTER — Other Ambulatory Visit: Payer: Self-pay

## 2020-11-10 MED ORDER — APIXABAN 2.5 MG PO TABS: 2.5000 mg | ORAL_TABLET | Freq: Two times a day (BID) | ORAL | 1 refills | Status: DC

## 2020-11-10 NOTE — Telephone Encounter (Signed)
Pt last saw Dr Johnsie Cancel 05/31/20, last labs 10/24/20 Creat 0.96, age 80, weight 53.5kg, based on specified criteria pt is on appropriate dosage of Eliquis 2.5mg  BID.  Will refill rx.

## 2020-11-14 ENCOUNTER — Other Ambulatory Visit: Payer: Self-pay | Admitting: *Deleted

## 2020-11-14 MED ORDER — APIXABAN 2.5 MG PO TABS: 2.5000 mg | ORAL_TABLET | Freq: Two times a day (BID) | ORAL | 1 refills | Status: DC

## 2020-11-14 NOTE — Telephone Encounter (Signed)
Received Eliquis paper refill on the patient & called to clarify where to send refill since on 11/10/20 refill was sent to [facility]. Husband, who is on the DPR, states she needs the refill sent to [facility] and not [facility]. Advised the last refill on 11/10/20 was sent there and I needed to clarify. Will send to requested pharmacy. Also, removed [facility] from pharmacy list.  Eliquis 5mg  refill request received. Patient is 80 years old, weight-53.5kg, Crea-0.96 on 10/24/20 via scanned labs, Diagnosis-Afib, and last seen by [name] on 05/31/20. Dose is appropriate based on dosing criteria. Will send in refill to requested pharmacy.

## 2020-11-24 ENCOUNTER — Other Ambulatory Visit: Payer: Self-pay

## 2020-11-24 ENCOUNTER — Encounter (HOSPITAL_COMMUNITY)
Admission: RE | Admit: 2020-11-24 | Discharge: 2020-11-24 | Disposition: A | Discharge status: Home / Self Care | Payer: Medicare HMO | Source: Ambulatory Visit | Attending: Internal Medicine | Admitting: Internal Medicine

## 2020-11-24 DIAGNOSIS — M810 Age-related osteoporosis without current pathological fracture: Secondary | ICD-10-CM | POA: Diagnosis present

## 2020-11-24 DIAGNOSIS — M8000XA Age-related osteoporosis with current pathological fracture, unspecified site, initial encounter for fracture: Secondary | ICD-10-CM | POA: Diagnosis not present

## 2020-11-24 MED ORDER — DENOSUMAB 60 MG/ML ~~LOC~~ SOSY: 60.0000 mg | PREFILLED_SYRINGE | Freq: Once | SUBCUTANEOUS | Status: AC

## 2020-11-24 MED ORDER — DENOSUMAB 60 MG/ML ~~LOC~~ SOSY
PREFILLED_SYRINGE | SUBCUTANEOUS | Status: AC
  Administered 2020-11-24: 60 mg via SUBCUTANEOUS
  Filled 2020-11-24: qty 1

## 2020-11-30 NOTE — Progress Notes (Signed)
Cardiology Office Note    Date:  12/06/2020   ID:  Colleen, [name] 05-14-1940, MRN [id number]   PCP:  Colleen Squibb, MD   [place]  Cardiologist:  Colleen Rouge, MD   Advanced Practice Provider:  No care team member to display Electrophysiologist:  None   [phone]   Chief Complaint  Patient presents with   Follow-up     History of Present Illness:  [name] is a 80 y.o. female with history of right subclavian steal with occluded right innominate failed previous stenting attempts followed by Dr. Trula [name], PAF on low-dose Eliquis because of bleeding issues, hypertension, HLD, GI bleed in the setting of INR over 10 while on Coumadin with switch to Eliquis, history of gastric and duodenal ulcers, chronic lung disease with necrotizing pneumonia.  Last saw Dr. Johnsie [name] 05/2020 and K up and held ARB and evenually stopped in May.  Patient comes in for f/u. Has cut back on K in diet and now K normal.Denies chest pain, dyspnea, palpitations, edema. Bruises easily. No bleeding problems. LDL up to 121. No regular exercise. Doesn't sit a lot, stays active around the house.     Past Medical History:  Diagnosis Date   Anemia 05/2014.   Atrial fibrillation ([place])    x 3 yrs   Breast cancer ([name])    S/P left mastectomy and chemotherapy 1989 remained in remission   Carotid artery occlusion    Carotid bruit    LICA 76-73% (duplex 4/19)   Degenerative arthritis of spine 2015   Chronic back pain   Hyperlipidemia    Hypertension    Meningioma ([place])    Osteoporosis    Pneumonia May 19, 2014   Stroke [facility])    CVA 2008   Subclavian steal syndrome    Ulcers of both lower extremities ([place]) 2015    Past Surgical History:  Procedure Laterality Date   CARDIAC CATHETERIZATION     CATARACT EXTRACTION Bilateral 2013   COLONOSCOPY  11/17/2010   Procedure: COLONOSCOPY;  Surgeon: Colleen Houston, MD;  [facility];  Service: Endoscopy;  Laterality:  N/A;  10:45 am   ESOPHAGOGASTRODUODENOSCOPY  11/17/2010   Procedure: ESOPHAGOGASTRODUODENOSCOPY (EGD);  Surgeon: Colleen Houston, MD;  [facility];  Service: Endoscopy;  Laterality: N/A;   ESOPHAGOGASTRODUODENOSCOPY N/A 02/16/2015   Procedure: ESOPHAGOGASTRODUODENOSCOPY (EGD);  Surgeon: Colleen Gunning, MD;  Location: [place];  Service: Gastroenterology;  Laterality: N/A;   EXPLORATORY LAPAROTOMY  1960s   For peritonitis of undetermined cause   EYE SURGERY     LUMBAR EPIDURAL INJECTION  06-2012--06-2013   pt. states she has had 5 epidurals in 06-2012----06-2013   MASTECTOMY Left 3790   YAG LASER APPLICATION Right 2/40/9735   Procedure: YAG LASER APPLICATION;  Surgeon: Colleen Guys, MD;  Location: [facility];  Service: Ophthalmology;  Laterality: Right;  right   YAG LASER APPLICATION Left 07/05/9240   Procedure: YAG LASER APPLICATION;  Surgeon: Colleen Guys, MD;  Location: [facility];  Service: Ophthalmology;  Laterality: Left;    Current Medications: Current Meds  Medication Sig   amLODipine (NORVASC) 5 MG tablet Take 1 tablet (5 mg total) by mouth daily.   apixaban (ELIQUIS) 2.5 MG TABS tablet Take 1 tablet (2.5 mg total) by mouth 2 (two) times daily.   calcium-vitamin D (OSCAL) 250-125 MG-UNIT per tablet Take 1 tablet by mouth 2 (two) times daily.   clopidogrel (PLAVIX) 75 MG tablet Take 1  tablet (75 mg total) by mouth every morning.   denosumab (PROLIA) 60 MG/ML SOSY injection Inject 60 mg into the skin every 6 (six) months.   ferrous sulfate 325 (65 FE) MG tablet Take 325 mg by mouth daily with breakfast.   furosemide (LASIX) 20 MG tablet Take 20 mg by mouth as needed. Only as needed PRN   magnesium oxide (MAG-OX) 400 MG tablet Take 400 mg by mouth every morning.    metoprolol tartrate (LOPRESSOR) 25 MG tablet TAKE 3 TABLETS TWICE A DAY   Multiple Vitamin (MULTIVITAMIN WITH MINERALS) TABS tablet Take 1 tablet by mouth every morning.   naloxegol oxalate (MOVANTIK) 25 MG  TABS tablet Take 25 mg by mouth daily as needed (constipation).    pantoprazole (PROTONIX) 40 MG tablet TAKE 1 TABLET DAILY   pravastatin (PRAVACHOL) 40 MG tablet Take 1 tablet (40 mg total) by mouth daily.   Tiotropium Bromide Monohydrate (SPIRIVA RESPIMAT) 2.5 MCG/ACT AERS USE 2 INHALATIONS BY MOUTH  DAILY   Tiotropium Bromide Monohydrate (SPIRIVA RESPIMAT) 2.5 MCG/ACT AERS Inhale 2 puffs into the lungs daily.   vitamin C (ASCORBIC ACID) 500 MG tablet Take 500 mg by mouth every morning.      Allergies:   Iodine, Iohexol, Povidone-iodine, and Tape   Social History   Socioeconomic History   Marital status: Married    Spouse name: Not on file   Number of children: 2   Years of education: Not on file   Highest education level: Not on file  Occupational History   Occupation: Retired    Fish farm manager: RETIRED  Tobacco Use   Smoking status: Former    Packs/day: 1.00    Years: 56.00    Pack years: 56.00    Types: Cigarettes    Quit date: 08/14/2006    Years since quitting: 14.3   Smokeless tobacco: Never   Tobacco comments:    quit in 2008  Vaping Use   Vaping Use: Never used  Substance and Sexual Activity   Alcohol use: No    Alcohol/week: 0.0 standard drinks   Drug use: No   Sexual activity: Never  Other Topics Concern   Not on file  Social History Narrative   Not on file   Social Determinants of Health   Financial Resource Strain: Not on file  Food Insecurity: Not on file  Transportation Needs: Not on file  Physical Activity: Not on file  Stress: Not on file  Social Connections: Not on file     Family History:  The patient's  family history includes Alzheimer's disease in her mother; Dementia in her mother; Hyperlipidemia in her mother; Hypertension in her mother; Parkinsonism in her father.   ROS:   Please see the history of present illness.    ROS All other systems reviewed and are negative.   PHYSICAL EXAM:   VS:  BP 122/70   Pulse 99   Ht 5\' 2"  (1.575 m)    Wt 115 lb 12.8 oz (52.5 kg)   SpO2 96%   BMI 21.18 kg/m   Physical Exam  GEN: Thin, in no acute distress  Neck: bilateral carotid bruits no JVD, or masses Cardiac:RRR; 2/6 systolic murmur LSB Respiratory:  clear to auscultation bilaterally, normal work of breathing GI: soft, nontender, nondistended, + BS Ext: bruising arms and legs, without cyanosis, clubbing, or edema, Good distal pulses bilaterally Neuro:  Alert and Oriented x 3, Strength and sensation are intact Psych: euthymic mood, full affect  Wt Readings from Last  3 Encounters:  12/06/20 115 lb 12.8 oz (52.5 kg)  05/31/20 118 lb (53.5 kg)  03/25/20 109 lb (49.4 kg)      Studies/Labs Reviewed:   EKG:  EKG is  ordered today.  The ekg ordered today demonstrates atrial flutter 99/m old septal infarct, nonspecific ST changes, unchanged from prior tracings.  Recent Labs: No results found for requested labs within last 8760 hours.   Lipid Panel    Component Value Date/Time   CHOL 137 08/11/2010 0000   TRIG 68 08/11/2010 0000   HDL 63 08/11/2010 0000   CHOLHDL 1.9 10/11/2009 0610   VLDL 17 10/11/2009 0610   LDLCALC 60 08/11/2010 0000    Additional studies/ records that were reviewed today include:  Carotid dopplers 03/2020 IMPRESSION: 1. Right carotid artery system: Tardus parvus waveforms throughout the right carotid system, similar to comparison, and suggestive of a severe stenosis of the carotid inflow, likely secondary to right brachiocephalic atherosclerotic plaque visualized on chest CT from 07/11/2015.   2. Left carotid artery system: 50-69% stenosis secondary to atherosclerotic plaque formation, similar comparison.   3. Vertebral artery system: Unchanged retrograde flow in the right vertebral artery. The left vertebral artery is patent with antegrade flow.   Ruthann Cancer, MD   [facility]   [facility]     Electronically Signed   By: Ruthann Cancer  MD   On: 03/14/2020 10:37   Risk Assessment/Calculations:    CHA2DS2-VASc Score = 7  This indicates a 11.2% annual risk of stroke. The patient's score is based upon: CHF History: No HTN History: Yes Diabetes History: Yes Stroke History: Yes Vascular Disease History: No Age Score: 2 Gender Score: 1       ASSESSMENT:    1. Paroxysmal atrial fibrillation (HCC)   2. Carotid artery disease, unspecified laterality, unspecified type ([place])   3. Essential hypertension   4. Hyperlipidemia, unspecified hyperlipidemia type   5. Bilateral carotid bruits      PLAN:  In order of problems listed above:  PAF on low-dose Eliquis because of bleeding issues-so far she is doing well other than skin bruising. Labs 10/24/20 all stable. HR 99. She is asymptomatic. 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 She'll work on lowering this.  Shared Decision Making/Informed Consent        Medication Adjustments/Labs and Tests Ordered: Current medicines are reviewed at length with the patient today.  Concerns regarding medicines are outlined above.  Medication changes, Labs and Tests ordered today are listed in the Patient Instructions below. Patient Instructions  Medication Instructions:  Your physician recommends that you continue on your current medications as directed. Please refer to the Current Medication list given to you today.  *If you need a refill on your cardiac medications before your next appointment, please call your pharmacy*   Lab Work: None ordered   If you have labs (blood work) drawn today and your tests are completely normal, you will receive your results only by: [place] (if you have MyChart) OR A  paper copy in the mail If you have any lab test that is abnormal or we need to change your treatment, we will call you to review the  results.   Testing/Procedures: Your physician has requested that you have a carotid duplex in December. This test is an ultrasound of the carotid arteries in your neck. It looks at blood flow through these arteries that supply the brain with blood. Allow one hour for this exam. There are no restrictions or special instructions.   Follow-Up: Follow up as scheduled    Other Instructions Please call our office if your heart rate is running above 100 bmp at rest     Signed, Ermalinda Barrios, PA-C  12/06/2020 11:10 AM    [address], [place], [place]  [address] Phone: [phone]; Fax: [phone]

## 2020-12-06 ENCOUNTER — Ambulatory Visit (INDEPENDENT_AMBULATORY_CARE_PROVIDER_SITE_OTHER): Payer: Medicare HMO | Admitting: Physician Assistant

## 2020-12-06 ENCOUNTER — Other Ambulatory Visit: Payer: Self-pay

## 2020-12-06 ENCOUNTER — Encounter: Payer: Self-pay | Admitting: Physician Assistant

## 2020-12-06 VITALS — BP 122/70 | HR 99 | Ht 62.0 in | Wt 115.8 lb

## 2020-12-06 DIAGNOSIS — I480 Paroxysmal atrial fibrillation: Secondary | ICD-10-CM

## 2020-12-06 DIAGNOSIS — I10 Essential (primary) hypertension: Secondary | ICD-10-CM | POA: Diagnosis not present

## 2020-12-06 DIAGNOSIS — I779 Disorder of arteries and arterioles, unspecified: Secondary | ICD-10-CM | POA: Diagnosis not present

## 2020-12-06 DIAGNOSIS — R0989 Other specified symptoms and signs involving the circulatory and respiratory systems: Secondary | ICD-10-CM

## 2020-12-06 DIAGNOSIS — E785 Hyperlipidemia, unspecified: Secondary | ICD-10-CM

## 2020-12-06 NOTE — Patient Instructions (Addendum)
Medication Instructions:  Your physician recommends that you continue on your current medications as directed. Please refer to the Current Medication list given to you today.  *If you need a refill on your cardiac medications before your next appointment, please call your pharmacy*   Lab Work: None ordered   If you have labs (blood work) drawn today and your tests are completely normal, you will receive your results only by: [place] (if you have MyChart) OR A paper copy in the mail If you have any lab test that is abnormal or we need to change your treatment, we will call you to review the results.   Testing/Procedures: Your physician has requested that you have a carotid duplex in December. This test is an ultrasound of the carotid arteries in your neck. It looks at blood flow through these arteries that supply the brain with blood. Allow one hour for this exam. There are no restrictions or special instructions.   Follow-Up: Follow up as scheduled    Other Instructions Please call our office if your heart rate is running above 100 bmp at rest

## 2021-03-16 ENCOUNTER — Ambulatory Visit (HOSPITAL_COMMUNITY)
Admission: RE | Admit: 2021-03-16 | Discharge: 2021-03-16 | Disposition: A | Discharge status: Home / Self Care | Payer: Medicare HMO | Source: Ambulatory Visit | Attending: Physician Assistant | Admitting: Physician Assistant

## 2021-03-16 ENCOUNTER — Other Ambulatory Visit: Payer: Self-pay

## 2021-03-16 DIAGNOSIS — R0989 Other specified symptoms and signs involving the circulatory and respiratory systems: Secondary | ICD-10-CM | POA: Diagnosis present

## 2021-03-20 ENCOUNTER — Telehealth: Payer: Self-pay | Admitting: Cardiovascular Disease

## 2021-03-20 DIAGNOSIS — I771 Stricture of artery: Secondary | ICD-10-CM

## 2021-03-20 NOTE — Telephone Encounter (Signed)
[name] is calling stating she was advised on Friday 03/17/21 that [name] is wanting [facility] to [facility]. She states she contacted them today and they advised her the do not have a referral from our office in order to schedule. Please advise.

## 2021-03-20 NOTE — Telephone Encounter (Signed)
Colleen Burn, PA-C  03/17/2021  8:46 AM EST     Carotid dopplers similar to last year with sever stenosis in right brachiocephalic area. Please have her f/u with Dr. Trula Slade who follows her. thanks    Placed referral for patient since she has not been seen since 2018.  Called patient with her message. Patient verbalized understanding of results and instructions.

## 2021-05-01 ENCOUNTER — Other Ambulatory Visit: Payer: Self-pay

## 2021-05-01 ENCOUNTER — Ambulatory Visit (INDEPENDENT_AMBULATORY_CARE_PROVIDER_SITE_OTHER): Payer: Medicare HMO | Admitting: Surgery

## 2021-05-01 ENCOUNTER — Encounter: Payer: Self-pay | Admitting: Surgery

## 2021-05-01 VITALS — BP 141/91 | HR 100 | Temp 98.1°F | Resp 20 | Ht 62.0 in | Wt 115.0 lb

## 2021-05-01 DIAGNOSIS — I6523 Occlusion and stenosis of bilateral carotid arteries: Secondary | ICD-10-CM

## 2021-05-01 NOTE — Progress Notes (Signed)
[facility] [place]  Patient name: [name] MRN: [id number] DOB: Nov 17, 1940 Sex: female   REQUESTING PROVIDER:    Ermalinda Barrios   REASON FOR CONSULT:    Innominate stenosis  HISTORY OF PRESENT ILLNESS:   [name] is a 81 y.o. female, who is well-known to our practice however has not been evaluated since 2018.  She has a history of stroke in 2008.  At that time she was diagnosed with an occluded innominate artery by catheter-based angiography.  I met the patient for the first time in 2011 when she developed left carotid stenosis which was asymptomatic.  She continues to be without symptoms.  Specifically, she denies numbness or weakness in either extremity.  She denies slurred speech.  She denies amaurosis fugax.    The patient has a history of breast cancer, status post left mastectomy.  She is on Eliquis for atrial fibrillation.  She takes a statin for hypercholesterolemia.  She is medically managed for hypertension.  She is a former smoker. PAST MEDICAL HISTORY    Past Medical History:  Diagnosis Date   Anemia 05/2014.   Atrial fibrillation ([place])    x 3 yrs   Breast cancer ([place])    S/P left mastectomy and chemotherapy 1989 remained in remission   Carotid artery occlusion    Carotid bruit    LICA 42-59% (duplex 5/63)   Degenerative arthritis of spine 2015   Chronic back pain   Hyperlipidemia    Hypertension    Meningioma ([place])    Osteoporosis    Pneumonia May 19, 2014   Stroke [facility])    CVA 2008   Subclavian steal syndrome    Ulcers of both lower extremities ([place]) 2015     FAMILY HISTORY   Family History  Problem Relation Age of Onset   Alzheimer's disease Mother    Dementia Mother    Hypertension Mother    Hyperlipidemia Mother    Parkinsonism Father     SOCIAL HISTORY:   Social History   Socioeconomic History   Marital status: Married    Spouse name: Not on file   Number of  children: 2   Years of education: Not on file   Highest education level: Not on file  Occupational History   Occupation: Retired    Fish farm manager: RETIRED  Tobacco Use   Smoking status: Former    Packs/day: 1.00    Years: 56.00    Pack years: 56.00    Types: Cigarettes    Quit date: 08/14/2006    Years since quitting: 14.7   Smokeless tobacco: Never   Tobacco comments:    quit in 2008  Vaping Use   Vaping Use: Never used  Substance and Sexual Activity   Alcohol use: No    Alcohol/week: 0.0 standard drinks   Drug use: No   Sexual activity: Never  Other Topics Concern   Not on file  Social History Narrative   Not on file   Social Determinants of Health   Financial Resource Strain: Not on file  Food Insecurity: Not on file  Transportation Needs: Not on file  Physical Activity: Not on file  Stress: Not on file  Social Connections: Not on file  Intimate Partner Violence: Not on file  ALLERGIES:    Allergies  Allergen Reactions   Iodine Rash and Other (See Comments)    BETADINE Rash/burning, blisters on skin. Burns skin   Iohexol Rash and Other (See Comments)    Blisters; PT NEEDS 13-HOUR PREP    Povidone-Iodine Other (See Comments)    Burns skin   Tape Itching, Rash and Other (See Comments)    Prefers PAPER TAPE Burns skin    CURRENT MEDICATIONS:    Current Outpatient Medications  Medication Sig Dispense Refill   amLODipine (NORVASC) 5 MG tablet Take 1 tablet (5 mg total) by mouth daily. 90 tablet 3   apixaban (ELIQUIS) 2.5 MG TABS tablet Take 1 tablet (2.5 mg total) by mouth 2 (two) times daily. 180 tablet 1   calcium-vitamin D (OSCAL) 250-125 MG-UNIT per tablet Take 1 tablet by mouth 2 (two) times daily.     clopidogrel (PLAVIX) 75 MG tablet Take 1 tablet (75 mg total) by mouth every morning. 90 tablet 3   denosumab (PROLIA) 60 MG/ML SOSY injection Inject 60 mg into the skin every 6 (six) months.     ferrous sulfate 325 (65 FE) MG tablet Take 325 mg by  mouth daily with breakfast.     furosemide (LASIX) 20 MG tablet Take 20 mg by mouth as needed. Only as needed PRN     magnesium oxide (MAG-OX) 400 MG tablet Take 400 mg by mouth every morning.      metoprolol tartrate (LOPRESSOR) 25 MG tablet TAKE 3 TABLETS TWICE A DAY 540 tablet 3   Multiple Vitamin (MULTIVITAMIN WITH MINERALS) TABS tablet Take 1 tablet by mouth every morning.     naloxegol oxalate (MOVANTIK) 25 MG TABS tablet Take 25 mg by mouth daily as needed (constipation).      pantoprazole (PROTONIX) 40 MG tablet TAKE 1 TABLET DAILY 90 tablet 3   pravastatin (PRAVACHOL) 40 MG tablet Take 1 tablet (40 mg total) by mouth daily. 90 tablet 3   Tiotropium Bromide Monohydrate (SPIRIVA RESPIMAT) 2.5 MCG/ACT AERS USE 2 INHALATIONS BY MOUTH  DAILY 12 g 3   vitamin C (ASCORBIC ACID) 500 MG tablet Take 500 mg by mouth every morning.      No current facility-administered medications for this visit.    REVIEW OF SYSTEMS:   [X] denotes positive finding, [ ] denotes negative finding Cardiac  Comments:  Chest pain or chest pressure:    Shortness of breath upon exertion:    Short of breath when lying flat:    Irregular heart rhythm:        Vascular    Pain in calf, thigh, or hip brought on by ambulation:    Pain in feet at night that wakes you up from your sleep:     Blood clot in your veins:    Leg swelling:         Pulmonary    Oxygen at home:    Productive cough:     Wheezing:         Neurologic    Sudden weakness in arms or legs:     Sudden numbness in arms or legs:     Sudden onset of difficulty speaking or slurred speech:    Temporary loss of vision in one eye:     Problems with dizziness:         Gastrointestinal    Blood in stool:      Vomited blood:         Genitourinary    Burning  when urinating:     Blood in urine:        Psychiatric    Major depression:         Hematologic    Bleeding problems:    Problems with blood clotting too easily:        Skin     Rashes or ulcers:        Constitutional    Fever or chills:     PHYSICAL EXAM:   Vitals:   05/01/21 1025  BP: (!) 141/91  Pulse: 100  Resp: 20  Temp: 98.1 F (36.7 C)  SpO2: 95%  Weight: 115 lb (52.2 kg)  Height: 5' 2" (1.575 m)    GENERAL: The patient is a well-nourished female, in no acute distress. The vital signs are documented above. CARDIAC: There is a regular rate and rhythm.  VASCULAR: Palpable left radial pulse, nonpalpable right PULMONARY: Nonlabored respirations ABDOMEN: Soft and non-tender with normal pitched bowel sounds.  MUSCULOSKELETAL: There are no major deformities or cyanosis. NEUROLOGIC: No focal weakness or paresthesias are detected. SKIN: There are no ulcers or rashes noted. PSYCHIATRIC: The patient has a normal affect.  STUDIES:   I have reviewed the following  Similar tardus parvus waveforms throughout the right carotid system suggestive of severe stenosis of the carotid inflow such as right brachiocephalic disease centrally.   Left ICA stenosis remains 50-69% by ultrasound criteria.   Unchanged retrograde flow in the right vertebral artery   Normal antegrade flow in the left vertebral artery.   ASSESSMENT and PLAN   Carotid: The patient remains asymptomatic and has less than 80% bilateral disease.  She has a known right innominate occlusion which is also asymptomatic.  She will continue to get routine surveillance with [name].  She will follow up with me in 1 year   Annamarie Major, IV, MD, FACS [facility] [phone] Pager [phone]

## 2021-05-11 ENCOUNTER — Other Ambulatory Visit: Payer: Self-pay | Admitting: Cardiovascular Disease

## 2021-05-11 NOTE — Telephone Encounter (Signed)
Pt last saw Ermalinda Barrios, PA on 12/06/20, last labs 10/24/20 Creat 0.96, age 81, weight 52.2kg, based on specified criteria pt is on appropriate dosage of Eliquis 2.5mg  BID for afib. Will refill rx.

## 2021-05-30 ENCOUNTER — Encounter (HOSPITAL_COMMUNITY): Admission: RE | Admit: 2021-05-30 | Payer: Medicare HMO | Source: Ambulatory Visit

## 2021-06-02 ENCOUNTER — Ambulatory Visit: Payer: Medicare HMO | Admitting: Cardiovascular Disease

## 2021-06-03 NOTE — Progress Notes (Signed)
Patient ID: [name], female   DOB: [birth year], 81 y.o.   MRN: [id number]  ? ? ? ?81 y.o. 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 She has been asymptomatic and last saw him 6/65/99 LICA 35-70% duplex 17/7/93 GI bleed 02/20/15 transfused 4 units INR over 10 coumadin changed to eliquis. Had gastric and duodenal ulcers. Chronic lung disease with necrotizing pneumonia moderate COPD sees Ramaschawmy from pulmonary  ? ?Still issues with falling Using rolling wheel chair now  Feels her UE tremors are worse  Seen by Dr Tat 12/12/17 ? Decrease dose of amiodarone furtherOr change eliquis to pradaxa and try primidone Also noted left foot drop ? From lumbar radiculopathy ? ?Told not to start Evinity for osteoporosis due to increased risk of MI/Stroke Dr Nevada Crane suggested Getting Prolia now  ? ?Husband upset about cost of eliquis and wanted 5 mg tabs called in so they could cut them in half Discussed how This is not appropriate and not advised to cut eliquis in half  ? ?Avapro d/c 05/31/20 for elevated K/Cr Normalized on labs 10/24/20  ? ? ? ?ROS: Denies fever, malais, weight loss, blurry vision, decreased visual acuity, cough, sputum, SOB, hemoptysis, pleuritic pain, palpitaitons, heartburn, abdominal pain, melena, lower extremity edema, claudication, or rash.  All other systems reviewed and negative  ? ?General: ?BP (!) 122/58   Pulse 65   Ht 5\' 2"  (1.575 m)   Wt 116 lb (52.6 kg)   SpO2 95%   BMI 21.22 kg/m?  ?Affect appropriate ?Frail thin chronically ill white female  ?HEENT: normal ?Neck supple with no adenopathy ?JVP normal bilateral carotid and subclavian  bruits no thyromegaly ?Lungs clear with no wheezing and good diaphragmatic motion ?Heart:  S1/S2 no murmur, no rub, gallop or click ?PMI normal post left mastectomy  ?Abdomen: benighn, BS positve, no tenderness, no AAA ?no bruit.  No HSM or HJR ?Decreased pulse and BP in right arm  ?No edema ?Neuro  UE tremors left foot drop  ?Skin bruising over arms  ?No muscular weakness ? ? ? ? ?Current Outpatient Medications  ?Medication Sig Dispense Refill  ? amLODipine (NORVASC) 5 MG tablet TAKE 1 TABLET DAILY 90 tablet 3  ? calcium-vitamin D (OSCAL) 250-125 MG-UNIT per tablet Take 1 tablet by mouth 2 (two) times daily.    ? clopidogrel (PLAVIX) 75 MG tablet Take 1 tablet (75 mg total) by mouth every morning. 90 tablet 3  ? denosumab (PROLIA) 60 MG/ML SOSY injection Inject 60 mg into the skin every 6 (six) months.    ? ELIQUIS 2.5 MG TABS tablet TAKE 1 TABLET TWICE A DAY 180 tablet 1  ? ferrous sulfate 325 (65 FE) MG tablet Take 325 mg by mouth daily with breakfast.    ? furosemide (LASIX) 20 MG tablet Take 20 mg by mouth as needed. Only as needed PRN    ? HYDROcodone-acetaminophen (NORCO/VICODIN) 5-325 MG tablet hydrocodone 5 mg-acetaminophen 325 mg tablet    ? magnesium oxide (MAG-OX) 400 MG tablet Take 400 mg by mouth every morning.     ? metoprolol tartrate (LOPRESSOR) 25 MG tablet TAKE 3 TABLETS TWICE A DAY 540 tablet 3  ? Multiple Vitamin (MULTIVITAMIN WITH MINERALS) TABS tablet Take 1 tablet by mouth every morning.    ? naloxegol oxalate (MOVANTIK) 25 MG TABS tablet Take 25 mg by mouth daily as needed (constipation).     ? pantoprazole (PROTONIX) 40 MG tablet TAKE 1 TABLET DAILY  90 tablet 3  ? pravastatin (PRAVACHOL) 40 MG tablet Take 1 tablet (40 mg total) by mouth daily. 90 tablet 3  ? Tiotropium Bromide Monohydrate (SPIRIVA RESPIMAT) 2.5 MCG/ACT AERS USE 2 INHALATIONS BY MOUTH  DAILY 12 g 3  ? vitamin C (ASCORBIC ACID) 500 MG tablet Take 500 mg by mouth every morning.     ? ?No current facility-administered medications for this visit.  ? ? ?Allergies ? ?Iodine, Iohexol, Povidone-iodine, and Tape ? ?Electrocardiogram:  11/18/19 Afib rate 100 nonspecific ST changes  ? 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 ?PAF:  Continue eliquis  lower to 2.5 bid if any further bleeding issues  ? ?Chol:  On statin. mevacor increased last visit LDL 90 10/24/20   ? ?HTN:  stable off avapro  ? ?Pulmonary:  Quit smoking 11 years ago Gold Class 2 COPD f/u pulmonary  ? ?Edema:  No DVT by duplex norvasc decreased stable ? ?Anemia :  History of gastric ulcers continue protonix f/u GI  ?Lab Results  ?Component Value Date  ? HCT 39.9 05/28/2016  ? ?CAD:  No chest pain extensive by CT last cath 2011 with no hemodynamically significant disease Continue plavix not on ASA due to GI bleed and need for eliquis for PAF ? ?Tremor:  Discussed changing from eliquis to pradaxa and starting primidone Amiodarone has been d/c She feels tremors are better and does not want to change her meds ? ? ?F/u with me in 6 months   ? ? ?Jenkins Rouge ? ? ?

## 2021-06-07 ENCOUNTER — Encounter (HOSPITAL_COMMUNITY)
Admission: RE | Admit: 2021-06-07 | Discharge: 2021-06-07 | Disposition: A | Discharge status: Home / Self Care | Payer: Medicare HMO | Source: Ambulatory Visit | Attending: Internal Medicine | Admitting: Internal Medicine

## 2021-06-07 DIAGNOSIS — M810 Age-related osteoporosis without current pathological fracture: Secondary | ICD-10-CM | POA: Insufficient documentation

## 2021-06-07 MED ORDER — DENOSUMAB 60 MG/ML ~~LOC~~ SOSY
PREFILLED_SYRINGE | SUBCUTANEOUS | Status: AC
  Administered 2021-06-07: 60 mg
  Filled 2021-06-07: qty 1

## 2021-06-07 MED ORDER — DENOSUMAB 60 MG/ML ~~LOC~~ SOSY: 60.0000 mg | PREFILLED_SYRINGE | Freq: Once | SUBCUTANEOUS | Status: AC

## 2021-06-08 ENCOUNTER — Ambulatory Visit (INDEPENDENT_AMBULATORY_CARE_PROVIDER_SITE_OTHER): Payer: Medicare HMO | Admitting: Cardiovascular Disease

## 2021-06-08 ENCOUNTER — Other Ambulatory Visit: Payer: Self-pay

## 2021-06-08 VITALS — BP 122/58 | HR 65 | Ht 62.0 in | Wt 116.0 lb

## 2021-06-08 DIAGNOSIS — I480 Paroxysmal atrial fibrillation: Secondary | ICD-10-CM | POA: Diagnosis not present

## 2021-06-08 DIAGNOSIS — E785 Hyperlipidemia, unspecified: Secondary | ICD-10-CM | POA: Diagnosis not present

## 2021-06-08 DIAGNOSIS — I771 Stricture of artery: Secondary | ICD-10-CM | POA: Diagnosis not present

## 2021-06-08 DIAGNOSIS — I10 Essential (primary) hypertension: Secondary | ICD-10-CM

## 2021-06-08 DIAGNOSIS — E782 Mixed hyperlipidemia: Secondary | ICD-10-CM

## 2021-06-08 NOTE — Patient Instructions (Signed)

## 2021-07-20 ENCOUNTER — Other Ambulatory Visit: Payer: Self-pay

## 2021-07-20 MED ORDER — PRAVASTATIN SODIUM 40 MG PO TABS: 40.0000 mg | ORAL_TABLET | Freq: Every day | ORAL | 3 refills | Status: DC

## 2021-08-31 ENCOUNTER — Other Ambulatory Visit: Payer: Self-pay | Admitting: Cardiovascular Disease

## 2021-10-06 ENCOUNTER — Other Ambulatory Visit: Payer: Self-pay | Admitting: Cardiovascular Disease

## 2021-10-25 ENCOUNTER — Encounter: Payer: Self-pay | Admitting: Nurse Practitioner

## 2021-10-25 ENCOUNTER — Ambulatory Visit (INDEPENDENT_AMBULATORY_CARE_PROVIDER_SITE_OTHER): Payer: Medicare HMO | Admitting: Nurse Practitioner

## 2021-10-25 VITALS — BP 128/88 | HR 94 | Temp 97.9°F | Ht 62.0 in | Wt 112.8 lb

## 2021-10-25 DIAGNOSIS — I480 Paroxysmal atrial fibrillation: Secondary | ICD-10-CM

## 2021-10-25 DIAGNOSIS — J449 Chronic obstructive pulmonary disease, unspecified: Secondary | ICD-10-CM

## 2021-10-25 MED ORDER — SPIRIVA RESPIMAT 2.5 MCG/ACT IN AERS: INHALATION_SPRAY | RESPIRATORY_TRACT | 3 refills | Status: DC

## 2021-10-25 MED ORDER — SPIRIVA RESPIMAT 1.25 MCG/ACT IN AERS
2.0000 | INHALATION_SPRAY | Freq: Every day | RESPIRATORY_TRACT | 0 refills | Status: DC
Start: 2021-10-25 — End: 2021-12-20

## 2021-10-25 NOTE — Assessment & Plan Note (Signed)
Compensated on current regimen.  Symptom burden is extremely low.  She rarely notices any dyspnea as long as she uses her Spiriva.  Does not have any impact on daily activities.  No changes to current regimen.  Refills have been sent of her Spiriva and she was provided with 2 samples.  Patient Instructions  Continue Spiriva 2 puffs daily Continue Albuterol inhaler 2 puffs every 6 hours as needed for shortness of breath or wheezing. Notify if symptoms persist despite rescue inhaler/neb use.  Follow up in 6 months with Dr. Chase Caller. If symptoms do not improve or worsen, please contact office for sooner follow up or seek emergency care.

## 2021-10-25 NOTE — Progress Notes (Signed)
@Patient  ID: [name], female    DOB: 12-12-1940, 81 y.o.   MRN: [id number]  Chief Complaint  Patient presents with   Follow-up    Patient has no complaints.     Referring provider: Celene Squibb, MD  HPI: 81 year old female, former smoker followed for COPD.  She is a patient of Dr. Golden Pop and last seen in office 12/22/2019.  Past medical history significant for hypertension, A-fib on Eliquis, CHF, CAD, HLD, anemia, chronic back pain, history of CVA.  She had a recurrent cavitary pneumonia on the right side which she was treated for in 2016 and again in 2017; recovered well with resolution of pneumonia on scan from April 2017.  She has not had any further pneumonias/infections since.  TEST/EVENTS:  07/08/2015 PFTs: FVC 76, FEV1 70, ratio 67, TLC 98, DLCOunc 46 07/11/2015 CT chest without contrast: Incidentally noted lesion in the right lobe of the thyroid.  Atherosclerosis is present.  There are calcified subcarinal lymph node.  Small hiatal hernia.  There is right apical pleural-parenchymal scarring.  Mild centrilobular emphysema.  Postinfectious scarring in the right middle and lower lobes.  There are a few scattered pulmonary nodules measuring 4 mm or less in size and unchanged, some of these are calcified.  12/22/2019: OV with Dr. Chase Caller.  Doing well on Spiriva; however concerned that it can be quite expensive when she runs into the donut hole.  She was provided with 2 samples and advised to call in the future if she had any issues with affordability.  Discussed getting third COVID booster, which she was encouraged to do so given her long history.  10/25/2021: Today-follow-up Patient presents today with her husband, who is also one of our patients and being seen today by myself, for overdue follow-up.  She reports that she has been feeling well.  She has no concerns or complaints regarding her breathing.  She really does not notice any significant shortness of breath.  No limits in  her daily activities.  Her husband reports that she walks laps around him at the grocery store.  She is on Spiriva daily, which works well for her.  She never uses her albuterol inhaler.  She has not required prednisone or antibiotics for acute exacerbations of her breathing.  Allergies  Allergen Reactions   Iodine Rash and Other (See Comments)    BETADINE Rash/burning, blisters on skin. Burns skin   Iohexol Rash and Other (See Comments)    Blisters; PT NEEDS 13-HOUR PREP    Povidone-Iodine Other (See Comments)    Burns skin   Tape Itching, Rash and Other (See Comments)    Prefers PAPER TAPE Burns skin    Immunization History  Administered Date(s) Administered   Influenza Split 01/07/2014   Influenza, High Dose Seasonal PF 01/28/2016, 01/07/2017, 12/17/2018   Influenza,inj,Quad PF,6+ Mos 01/12/2015   Influenza,inj,quad, With Preservative 12/16/2020   Influenza-Unspecified 01/07/2013   Moderna Sars-Covid-2 Vaccination 05/02/2019, 06/01/2019   Pneumococcal Conjugate-13 01/07/2014   Pneumococcal Polysaccharide-23 01/07/2017   Tdap 08/01/2011   Zoster Recombinat (Shingrix) 01/10/2017    Past Medical History:  Diagnosis Date   Anemia 05/2014.   Atrial fibrillation ([place])    x 3 yrs   Breast cancer ([place])    S/P left mastectomy and chemotherapy 1989 remained in remission   Carotid artery occlusion    Carotid bruit    LICA 41-96% (duplex 2/22)   Degenerative arthritis of spine 2015   Chronic back pain   Hyperlipidemia  Hypertension    Meningioma ([place])    Osteoporosis    Pneumonia May 19, 2014   Stroke [facility])    CVA 2008   Subclavian steal syndrome    Ulcers of both lower extremities ([place]) 2015    Tobacco History: Social History   Tobacco Use  Smoking Status Former   Packs/day: 1.00   Years: 56.00   Total pack years: 56.00   Types: Cigarettes   Quit date: 08/14/2006   Years since quitting: 15.2  Smokeless Tobacco Never  Tobacco Comments   quit in 2008    Counseling given: Not Answered Tobacco comments: quit in 2008   Outpatient Medications Prior to Visit  Medication Sig Dispense Refill   amLODipine (NORVASC) 5 MG tablet TAKE 1 TABLET DAILY 90 tablet 3   calcium-vitamin D (OSCAL) 250-125 MG-UNIT per tablet Take 1 tablet by mouth 2 (two) times daily.     clopidogrel (PLAVIX) 75 MG tablet TAKE 1 TABLET EVERY MORNING 90 tablet 3   denosumab (PROLIA) 60 MG/ML SOSY injection Inject 60 mg into the skin every 6 (six) months.     ELIQUIS 2.5 MG TABS tablet TAKE 1 TABLET TWICE A DAY 180 tablet 1   ferrous sulfate 325 (65 FE) MG tablet Take 325 mg by mouth daily with breakfast.     furosemide (LASIX) 20 MG tablet Take 20 mg by mouth as needed. Only as needed PRN     HYDROcodone-acetaminophen (NORCO/VICODIN) 5-325 MG tablet hydrocodone 5 mg-acetaminophen 325 mg tablet     magnesium oxide (MAG-OX) 400 MG tablet Take 400 mg by mouth every morning.      metoprolol tartrate (LOPRESSOR) 25 MG tablet TAKE 3 TABLETS TWICE A DAY 540 tablet 3   Multiple Vitamin (MULTIVITAMIN WITH MINERALS) TABS tablet Take 1 tablet by mouth every morning.     naloxegol oxalate (MOVANTIK) 25 MG TABS tablet Take 25 mg by mouth daily as needed (constipation).      pantoprazole (PROTONIX) 40 MG tablet TAKE 1 TABLET DAILY 90 tablet 3   pravastatin (PRAVACHOL) 40 MG tablet Take 1 tablet (40 mg total) by mouth daily. 90 tablet 3   vitamin C (ASCORBIC ACID) 500 MG tablet Take 500 mg by mouth every morning.      Tiotropium Bromide Monohydrate (SPIRIVA RESPIMAT) 2.5 MCG/ACT AERS USE 2 INHALATIONS BY MOUTH  DAILY 12 g 3   No facility-administered medications prior to visit.     Review of Systems:   Constitutional: No weight loss or gain, night sweats, fevers, chills, fatigue, or lassitude. HEENT: No headaches, difficulty swallowing, tooth/dental problems, or sore throat. No sneezing, itching, ear ache, nasal congestion, or post nasal drip CV:  No chest pain, orthopnea, PND,  swelling in lower extremities, anasarca, dizziness, palpitations, syncope Resp: No shortness of breath with exertion or at rest. No excess mucus or change in color of mucus. No productive or non-productive. No hemoptysis. No wheezing.  No chest wall deformity Skin: No rash, lesions, ulcerations MSK:  No joint pain or swelling.  No decreased range of motion.  No back pain. Neuro: No dizziness or lightheadedness.  Psych: No depression or anxiety. Mood stable.     Physical Exam:  BP 128/88 (BP Location: Left Arm, Patient Position: Sitting, Cuff Size: Large)   Pulse 94   Temp 97.9 F (36.6 C) (Oral)   Ht 5\' 2"  (1.575 m)   Wt 112 lb 12.8 oz (51.2 kg)   SpO2 98%   BMI 20.63 kg/m   GEN: Pleasant,  interactive, well-appearing; in no acute distress HEENT:  Normocephalic and atraumatic. PERRLA. Sclera white. Nasal turbinates pink, moist and patent bilaterally. No rhinorrhea present. Oropharynx pink and moist, without exudate or edema. No lesions, ulcerations, or postnasal drip.  NECK:  Supple w/ fair ROM. No JVD present.  CV: Irregular rhythm, rate controlled, no m/r/g, no peripheral edema. Pulses intact, +2 bilaterally. No cyanosis, pallor or clubbing. PULMONARY:  Unlabored, regular breathing. Clear bilaterally A&P w/o wheezes/rales/rhonchi. No accessory muscle use. No dullness to percussion. GI: BS present and normoactive. Soft, non-tender to palpation. No organomegaly or masses detected. No CVA tenderness. MSK: No erythema, warmth or tenderness. Cap refil <2 sec all extrem. Neuro: A/Ox3. No focal deficits noted.   Skin: Warm, no lesions or rashe Psych: Normal affect and behavior. Judgement and thought content appropriate.     Lab Results:  CBC    Component Value Date/Time   WBC 11.2 (H) 01/19/2016 0739   RBC 4.14 01/19/2016 0739   HGB 13.1 05/28/2016 0834   HCT 39.9 05/28/2016 0834   PLT 362 01/19/2016 0739   MCV 97.8 01/19/2016 0739   MCH 31.9 01/19/2016 0739   MCHC 32.6  01/19/2016 0739   RDW 14.1 01/19/2016 0739   LYMPHSABS 1.8 06/08/2015 0900   MONOABS 2.0 (H) 06/08/2015 0900   EOSABS 0.0 06/08/2015 0900   BASOSABS 0.0 06/08/2015 0900    BMET    Component Value Date/Time   NA 135 03/14/2015 1230   NA 136 (A) 08/11/2010 0000   K 3.9 03/14/2015 1230   CL 101 03/14/2015 1230   CO2 23 03/14/2015 1230   GLUCOSE 111 (H) 03/14/2015 1230   BUN 16 03/14/2015 1230   BUN 21 08/11/2010 0000   CREATININE 0.57 (L) 03/14/2015 1230   CALCIUM 8.5 (L) 03/14/2015 1230   GFRNONAA >60 02/20/2015 0418   GFRAA >60 02/20/2015 0418    BNP No results found for: "BNP"   Imaging:  No results found.       Latest Ref Rng & Units 07/08/2015    7:53 AM  PFT Results  FVC-Pre L 2.09   FVC-Predicted Pre % 76   FVC-Post L 1.96   FVC-Predicted Post % 71   Pre FEV1/FVC % % 69   Post FEV1/FCV % % 67   FEV1-Pre L 1.44   FEV1-Predicted Pre % 70   FEV1-Post L 1.31   DLCO uncorrected ml/min/mmHg 11.33   DLCO UNC% % 46   DLVA Predicted % 73   TLC L 4.96   TLC % Predicted % 98   RV % Predicted % 135     No results found for: "NITRICOXIDE"      Assessment & Plan:   COPD, moderate (HCC) Compensated on current regimen.  Symptom burden is extremely low.  She rarely notices any dyspnea as long as she uses her Spiriva.  Does not have any impact on daily activities.  No changes to current regimen.  Refills have been sent of her Spiriva and she was provided with 2 samples.  Patient Instructions  Continue Spiriva 2 puffs daily Continue Albuterol inhaler 2 puffs every 6 hours as needed for shortness of breath or wheezing. Notify if symptoms persist despite rescue inhaler/neb use.  Follow up in 6 months with Dr. Chase Caller. If symptoms do not improve or worsen, please contact office for sooner follow up or seek emergency care.     Atrial fibrillation ([place]) Rate controlled.  She is chronically anticoagulated with Eliquis.  No excessive bruising or  bleeding.   Follow-up with cardiology as scheduled.  I spent 25 minutes of dedicated to the care of this patient on the date of this encounter to include pre-visit review of records, face-to-face time with the patient discussing conditions above, post visit ordering of testing, clinical documentation with the electronic health record, making appropriate referrals as documented, and communicating necessary findings to members of the patients care team.  Clayton Bibles, NP 10/25/2021  Pt aware and understands NP's role.

## 2021-10-25 NOTE — Assessment & Plan Note (Signed)
Rate controlled.  She is chronically anticoagulated with Eliquis.  No excessive bruising or bleeding.  Follow-up with cardiology as scheduled.

## 2021-10-25 NOTE — Patient Instructions (Addendum)
Continue Spiriva 2 puffs daily Continue Albuterol inhaler 2 puffs every 6 hours as needed for shortness of breath or wheezing. Notify if symptoms persist despite rescue inhaler/neb use.  Follow up in 6 months with Dr. Chase Caller. If symptoms do not improve or worsen, please contact office for sooner follow up or seek emergency care.

## 2021-11-06 ENCOUNTER — Telehealth: Payer: Self-pay | Admitting: Cardiovascular Disease

## 2021-11-06 NOTE — Telephone Encounter (Signed)
Pt c/o medication issue:  1. Name of Medication:   Losinopril, 5mg   2. How are you currently taking this medication (dosage and times per day)? N/A  3. Are you having a reaction (difficulty breathing--STAT)? N/A  4. What is your medication issue?   Caller wants to know if this medication can be prescribed to the patient because there is protein in her urine.

## 2021-11-06 NOTE — Telephone Encounter (Signed)
Left message for [facility] Dr. Peterson Lombard nurse to call our office back.

## 2021-11-07 NOTE — Telephone Encounter (Signed)
Talked with Magda Paganini the nurse form Dr. Peterson Lombard office. She stated patient wants Dr. Kyla Balzarine advisement on taking lisinopril 5 mg before she will let their office send in the medication. Patient is concerned about taking due to the protein in her urine. They are aware [name] is out of the office this week and next. Magda Paganini will let Dr. Mare Ferrari know that message will be sent to [name], but if they want a sooner answer we can consult with DOD. Dr. Peterson Lombard office will call us back sooner, or will await Dr. Kyla Balzarine advisement.

## 2021-11-16 ENCOUNTER — Other Ambulatory Visit: Payer: Self-pay

## 2021-11-16 MED ORDER — APIXABAN 2.5 MG PO TABS: 2.5000 mg | ORAL_TABLET | Freq: Two times a day (BID) | ORAL | 1 refills | Status: DC

## 2021-11-16 NOTE — Telephone Encounter (Signed)
Prescription refill request for Eliquis received. Indication:Afib Last office visit:3/23 Scr:0.8 Age: 81 Weight:51.2 kg  Prescription refilled

## 2021-11-20 NOTE — Telephone Encounter (Signed)
Left message for nurse to call back from Dr. Peterson Lombard office.

## 2021-11-23 ENCOUNTER — Other Ambulatory Visit: Payer: Self-pay | Admitting: Cardiovascular Disease

## 2021-11-23 DIAGNOSIS — I480 Paroxysmal atrial fibrillation: Secondary | ICD-10-CM

## 2021-11-23 NOTE — Telephone Encounter (Signed)
Eliquis 2.5mg  refill request received. Patient is 81 years old, weight-51.2kg, Crea-0.85 on 04/27/2021 via scanned labs, Diagnosis-Afib, and last seen by Dr Johnsie Cancel 06/08/2021. Dose is appropriate based on dosing criteria. Will send in refill to requested pharmacy.

## 2021-12-12 ENCOUNTER — Encounter (HOSPITAL_COMMUNITY)
Admission: RE | Admit: 2021-12-12 | Discharge: 2021-12-12 | Disposition: A | Discharge status: Home / Self Care | Payer: Medicare HMO | Source: Ambulatory Visit | Attending: Internal Medicine | Admitting: Internal Medicine

## 2021-12-12 ENCOUNTER — Encounter (HOSPITAL_COMMUNITY): Payer: Medicare HMO

## 2021-12-12 ENCOUNTER — Other Ambulatory Visit: Payer: Self-pay

## 2021-12-12 DIAGNOSIS — M810 Age-related osteoporosis without current pathological fracture: Secondary | ICD-10-CM | POA: Diagnosis present

## 2021-12-12 MED ORDER — DENOSUMAB 60 MG/ML ~~LOC~~ SOSY
60.0000 mg | PREFILLED_SYRINGE | Freq: Once | SUBCUTANEOUS | Status: AC
  Administered 2021-12-12: 60 mg via SUBCUTANEOUS

## 2021-12-12 NOTE — Progress Notes (Signed)
Diagnosis: Osteoporosis  Provider:  Wende Neighbors MD  Procedure: Injection  Prolia (Denosumab), Dose: 60 mg, Site: subcutaneous, Number of injections: 1  Discharge: Condition: Good, Destination: Home . AVS provided to patient.   Performed by:  Jonelle Sidle, RN

## 2021-12-13 NOTE — Progress Notes (Deleted)
Patient ID: [name], female   DOB: Mar 13, 1941, 81 y.o.   MRN: [id number]     81 y.o. 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 She has been asymptomatic and last saw him 4/85/46 LICA 27-03% duplex 50/0/93 GI bleed 02/20/15 transfused 4 units INR over 10 coumadin changed to eliquis. Had gastric and duodenal ulcers. Chronic lung disease with necrotizing pneumonia moderate COPD sees Ramaschawmy from pulmonary   Still issues with falling Using rolling wheel chair now  Feels her UE tremors are worse  Seen by Dr Tat 12/12/17 ? Decrease dose of amiodarone furtherOr change eliquis to pradaxa and try primidone Also noted left foot drop ? From lumbar radiculopathy  Told not to start Evinity for osteoporosis due to increased risk of MI/Stroke Dr Nevada Crane suggested Getting Prolia now   Husband upset about cost of eliquis and wanted 5 mg tabs called in so they could cut them in half Discussed how This is not appropriate and not advised to cut eliquis in half   Avapro d/c 05/31/20 for elevated K/Cr  1.23/54. Normalized on labs 10/24/20 Primary wanted to start Lisinopril for proteinuria but not a great idea given above and age   ***  ROS: Denies fever, malais, weight loss, blurry vision, decreased visual acuity, cough, sputum, SOB, hemoptysis, pleuritic pain, palpitaitons, heartburn, abdominal pain, melena, lower extremity edema, claudication, or rash.  All other systems reviewed and negative   General: There were no vitals taken for this visit. Affect appropriate Frail thin chronically ill white female  HEENT: normal Neck supple with no adenopathy JVP normal bilateral carotid and subclavian  bruits no thyromegaly Lungs clear with no wheezing and good diaphragmatic motion Heart:  S1/S2 no murmur, no rub, gallop or click PMI normal post left mastectomy  Abdomen: benighn, BS positve, no tenderness, no AAA no bruit.  No HSM or HJR Decreased  pulse and BP in right arm  No edema Neuro UE tremors left foot drop  Skin bruising over arms  No muscular weakness     Current Outpatient Medications  Medication Sig Dispense Refill   amLODipine (NORVASC) 5 MG tablet TAKE 1 TABLET DAILY 90 tablet 3   calcium-vitamin D (OSCAL) 250-125 MG-UNIT per tablet Take 1 tablet by mouth 2 (two) times daily.     clopidogrel (PLAVIX) 75 MG tablet TAKE 1 TABLET EVERY MORNING 90 tablet 3   denosumab (PROLIA) 60 MG/ML SOSY injection Inject 60 mg into the skin every 6 (six) months.     ELIQUIS 2.5 MG TABS tablet TAKE 1 TABLET TWICE A DAY 180 tablet 1   ferrous sulfate 325 (65 FE) MG tablet Take 325 mg by mouth daily with breakfast.     furosemide (LASIX) 20 MG tablet Take 20 mg by mouth as needed. Only as needed PRN     HYDROcodone-acetaminophen (NORCO/VICODIN) 5-325 MG tablet hydrocodone 5 mg-acetaminophen 325 mg tablet     magnesium oxide (MAG-OX) 400 MG tablet Take 400 mg by mouth every morning.      metoprolol tartrate (LOPRESSOR) 25 MG tablet TAKE 3 TABLETS TWICE A DAY 540 tablet 3   Multiple Vitamin (MULTIVITAMIN WITH MINERALS) TABS tablet Take 1 tablet by mouth every morning.     naloxegol oxalate (MOVANTIK) 25 MG TABS tablet Take 25 mg by mouth daily as needed (constipation).      pantoprazole (PROTONIX) 40 MG tablet TAKE 1 TABLET DAILY 90 tablet 3   pravastatin (PRAVACHOL) 40 MG tablet  Take 1 tablet (40 mg total) by mouth daily. 90 tablet 3   Tiotropium Bromide Monohydrate (SPIRIVA RESPIMAT) 1.25 MCG/ACT AERS Inhale 2 puffs into the lungs daily. 4 g 0   Tiotropium Bromide Monohydrate (SPIRIVA RESPIMAT) 2.5 MCG/ACT AERS USE 2 INHALATIONS BY MOUTH  DAILY 12 g 3   vitamin C (ASCORBIC ACID) 500 MG tablet Take 500 mg by mouth every morning.      No current facility-administered medications for this visit.    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 mevacor increased last visit LDL 90 10/24/20    HTN:  stable off avapro   Pulmonary:  Quit smoking 11 years ago Gold Class 2 COPD f/u pulmonary   Edema:  No DVT by duplex norvasc decreased stable  Anemia :  History of gastric ulcers continue protonix f/u GI  Lab Results  Component Value Date   HCT 39.9 05/28/2016   CAD:  No chest pain extensive by CT last cath 2011 with no hemodynamically significant disease Continue plavix not on ASA due to GI bleed and need for eliquis for PAF  Tremor:  Discussed changing from eliquis to pradaxa and starting primidone Amiodarone has been d/c She feels tremors are better and does not want to change her meds   F/u with me in 6 months     Jenkins Rouge

## 2021-12-14 ENCOUNTER — Other Ambulatory Visit: Payer: Self-pay | Admitting: *Deleted

## 2021-12-14 DIAGNOSIS — M79604 Pain in right leg: Secondary | ICD-10-CM

## 2021-12-14 NOTE — H&P (View-Only) (Signed)
HISTORY AND PHYSICAL     CC:  follow up. Requesting Provider:  Celene Squibb, MD  HPI: This is a 81 y.o. female who is here today for follow up for PAD.  Pt is pt of Dr. Trula Slade and has hx of carotid artery stenosis that is followed by [name].  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    The patient has a history of breast cancer, status post left mastectomy.  She is on Eliquis for atrial fibrillation.    Pt was last seen January 2023 by Dr. Trula Slade and at that time, she was not having any neurological symptoms.  Her routine surveillance is with [name] and she was to f/u with Dr. Trula Slade in one year.    The pt returns today for follow up and here with her husband of 92 years and he helps provide information.  They tell me that she had a small wound that started on the left great toe about 6-7 weeks ago.  This has continued to get worse over time.  She is also having rest pain in the left foot where she does not tolerate it up in the bed and had to hang her leg down to get some relief.  They state she had placed a solonapas pad on the top of the foot and it caused a sore that is also not healing.  She has discoloration of the left 2nd toe as well.  She does not have any pain in the right foot.  She states that due to her foot she is really unable to walk due to the pain in the left foot.  She does have hx of back problems as well.    She and her husband state that she has had her veins removed from both legs around 2015 at [place].   She does have some swelling in the right leg.  She has been started on lisinopril.    Her husband states that she had labs drawn at Dr. [name] office and gave [name] a copy.  Her labs from January revealed normal kidney function.  She states she had labs drawn in July and her husband states they were not much different from January but I do not have a copy of these labs.    She  has been very compliant with her Eliquis and they tell me she has not missed any doses.   She is on Eliquis for afib.   The pt is on a statin for cholesterol management.    The pt is not on an aspirin.    Other AC:  Plavix/Eliquis The pt is on CCB, diuretic, BB for hypertension.  The pt does not have diabetes. Tobacco hx:  former   Past Medical History:  Diagnosis Date   Anemia 05/2014.   Atrial fibrillation ([place])    x 3 yrs   Breast cancer ([place])    S/P left mastectomy and chemotherapy 1989 remained in remission   Carotid artery occlusion    Carotid bruit    LICA 54-27% (duplex 0/62)   Degenerative arthritis of spine 2015   Chronic back pain   Hyperlipidemia    Hypertension    Meningioma ([place])    Osteoporosis    Pneumonia May 19, 2014   Stroke [facility])    CVA 2008   Subclavian steal syndrome    Ulcers of both lower extremities ([place]) 2015  Past Surgical History:  Procedure Laterality Date   CARDIAC CATHETERIZATION     CATARACT EXTRACTION Bilateral 2013   COLONOSCOPY  11/17/2010   Procedure: COLONOSCOPY;  Surgeon: [name], MD;  [facility];  Service: Endoscopy;  Laterality: N/A;  10:45 am   ESOPHAGOGASTRODUODENOSCOPY  11/17/2010   Procedure: ESOPHAGOGASTRODUODENOSCOPY (EGD);  Surgeon: [name], MD;  [facility];  Service: Endoscopy;  Laterality: N/A;   ESOPHAGOGASTRODUODENOSCOPY N/A 02/16/2015   Procedure: ESOPHAGOGASTRODUODENOSCOPY (EGD);  Surgeon: [name], MD;  Location: [place];  Service: Gastroenterology;  Laterality: N/A;   EXPLORATORY LAPAROTOMY  1960s   For peritonitis of undetermined cause   EYE SURGERY     LUMBAR EPIDURAL INJECTION  06-2012--06-2013   pt. states she has had 5 epidurals in 06-2012----06-2013   MASTECTOMY Left 4008   YAG LASER APPLICATION Right 6/76/1950   Procedure: YAG LASER APPLICATION;  Surgeon: [name], MD;  Location[facility];  Service: Ophthalmology;  Laterality: Right;  right    YAG LASER APPLICATION Left 9/32/6712   Procedure: YAG LASER APPLICATION;  Surgeon: [name], MD;  Location[facility];  Service: Ophthalmology;  Laterality: Left;    Allergies  Allergen Reactions   Iodine Rash and Other (See Comments)    BETADINE Rash/burning, blisters on skin. Burns skin   Iohexol Rash and Other (See Comments)    Blisters; PT NEEDS 13-HOUR PREP    Povidone-Iodine Other (See Comments)    Burns skin   Tape Itching, Rash and Other (See Comments)    Prefers PAPER TAPE Burns skin    Current Outpatient Medications  Medication Sig Dispense Refill   amLODipine (NORVASC) 5 MG tablet TAKE 1 TABLET DAILY 90 tablet 3   calcium-vitamin D (OSCAL) 250-125 MG-UNIT per tablet Take 1 tablet by mouth 2 (two) times daily.     clopidogrel (PLAVIX) 75 MG tablet TAKE 1 TABLET EVERY MORNING 90 tablet 3   denosumab (PROLIA) 60 MG/ML SOSY injection Inject 60 mg into the skin every 6 (six) months.     ELIQUIS 2.5 MG TABS tablet TAKE 1 TABLET TWICE A DAY 180 tablet 1   ferrous sulfate 325 (65 FE) MG tablet Take 325 mg by mouth daily with breakfast.     furosemide (LASIX) 20 MG tablet Take 20 mg by mouth as needed. Only as needed PRN     HYDROcodone-acetaminophen (NORCO/VICODIN) 5-325 MG tablet hydrocodone 5 mg-acetaminophen 325 mg tablet     magnesium oxide (MAG-OX) 400 MG tablet Take 400 mg by mouth every morning.      metoprolol tartrate (LOPRESSOR) 25 MG tablet TAKE 3 TABLETS TWICE A DAY 540 tablet 3   Multiple Vitamin (MULTIVITAMIN WITH MINERALS) TABS tablet Take 1 tablet by mouth every morning.     naloxegol oxalate (MOVANTIK) 25 MG TABS tablet Take 25 mg by mouth daily as needed (constipation).      pantoprazole (PROTONIX) 40 MG tablet TAKE 1 TABLET DAILY 90 tablet 3   pravastatin (PRAVACHOL) 40 MG tablet Take 1 tablet (40 mg total) by mouth daily. 90 tablet 3   Tiotropium Bromide Monohydrate (SPIRIVA RESPIMAT) 1.25 MCG/ACT AERS Inhale 2 puffs into the lungs daily. 4 g 0    Tiotropium Bromide Monohydrate (SPIRIVA RESPIMAT) 2.5 MCG/ACT AERS USE 2 INHALATIONS BY MOUTH  DAILY 12 g 3   vitamin C (ASCORBIC ACID) 500 MG tablet Take 500 mg by mouth every morning.      No current facility-administered medications for this visit.  Family History  Problem Relation Age of Onset   Alzheimer's disease Mother    Dementia Mother    Hypertension Mother    Hyperlipidemia Mother    Parkinsonism Father     Social History   Socioeconomic History   Marital status: Married    Spouse name: Not on file   Number of children: 2   Years of education: Not on file   Highest education level: Not on file  Occupational History   Occupation: Retired    Fish farm manager: RETIRED  Tobacco Use   Smoking status: Former    Packs/day: 1.00    Years: 56.00    Total pack years: 56.00    Types: Cigarettes    Quit date: 08/14/2006    Years since quitting: 15.3   Smokeless tobacco: Never   Tobacco comments:    quit in 2008  Vaping Use   Vaping Use: Never used  Substance and Sexual Activity   Alcohol use: No    Alcohol/week: 0.0 standard drinks of alcohol   Drug use: No   Sexual activity: Never  Other Topics Concern   Not on file  Social History Narrative   Not on file   Social Determinants of Health   Financial Resource Strain: Not on file  Food Insecurity: Not on file  Transportation Needs: Not on file  Physical Activity: Not on file  Stress: Not on file  Social Connections: Not on file  Intimate Partner Violence: Not on file     REVIEW OF SYSTEMS:   [X]  denotes positive finding, [ ]  denotes negative finding Cardiac  Comments:  Chest pain or chest pressure:    Shortness of breath upon exertion:    Short of breath when lying flat:    Irregular heart rhythm:        Vascular    Pain in calf, thigh, or hip brought on by ambulation: x   Pain in feet at night that wakes you up from your sleep:  x   Blood clot in your veins:    Leg swelling:  x       Pulmonary     Oxygen at home:    Productive cough:     Wheezing:         Neurologic    Sudden weakness in arms or legs:     Sudden numbness in arms or legs:     Sudden onset of difficulty speaking or slurred speech:    Temporary loss of vision in one eye:     Problems with dizziness:         Gastrointestinal    Blood in stool:     Vomited blood:         Genitourinary    Burning when urinating:     Blood in urine:        Psychiatric    Major depression:         Hematologic    Bleeding problems:    Problems with blood clotting too easily:        Skin    Rashes or ulcers:        Constitutional    Fever or chills:      PHYSICAL EXAMINATION:  Today's Vitals   12/15/21 1133  BP: 105/66  Pulse: 60  Resp: 20  Temp: 98 F (36.7 C)  TempSrc: Temporal  SpO2: 95%  Height: 5\' 2"  (1.575 m)  PainSc: 8    Body mass index is 20.63 kg/m.   General:  WDWN in NAD; vital signs documented above Gait: Not observed HENT: WNL, normocephalic Pulmonary: normal non-labored breathing , without wheezing Cardiac: irregular HR Abdomen: soft Skin: without rashes Vascular Exam/Pulses:  Right Left  Radial 2+ (normal) 2+ (normal)  Femoral 2+ (normal) 2+ (normal)  DP Brisk biphasic doppler Unable to obtain  PT Brisk biphasic doppler Unable to obtain  Peroneal monophasic Unable to obtain    Extremities:  Ischemic changes to left foot; wound to lateral left great toe; ischemic changes to left plantar surface of the left 2nd toe.  Motor is not in tact as she is unable to wiggle left toes.     Left foot  Left foot  Left foot   Musculoskeletal: no muscle wasting or atrophy  Neurologic: A&O X 3 Psychiatric:  The pt has Normal affect.   Non-Invasive Vascular Imaging:   ABI's/TBI's on 12/15/2021: Right:  1.05/0.75  (B)- Great toe pressure: 95 Left:  0.33/bandage - Great toe pressure: bandage  Previous ABI 12/24/2014 Right: 0.86 (T) Left:  1.14 (T)  ASSESSMENT/PLAN:: 81 y.o. female here  for follow up for PAD with hx of carotid stenosis, innominate occlusion but presents today for evaluation for left foot pain with non healing wound.    -pt LLE critical limb ischemia with ABI of 33% but unable to get doppler signals on exam on the left foot and she has non healing wounds and ischemic changes and rest pain left foot.  She does have palpable bilateral femoral pulses.  -will plan for angiogram with runoff and possible intervention on Tuesday with Dr. Trula Slade.  Discussed with pt that she is at risk of toe amputation and possibly more proximal amputation.   -I have asked them not to place any heat or pads on the left foot and keep it clean and protected.   -she is on Eliquis for afib.  Our RN will call to make sure okay to hold for procedure.   -she has been started on lisinopril since her last visit as well   Leontine Locket, [facility] [phone]  Clinic MD:   Virl Cagey on call MD

## 2021-12-14 NOTE — Progress Notes (Signed)
HISTORY AND PHYSICAL     CC:  follow up. Requesting Provider:  Celene Squibb, MD  HPI: This is a 81 y.o. female who is here today for follow up for PAD.  Pt is pt of Dr. Trula Slade and has hx of carotid artery stenosis that is followed by [name].  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    The patient has a history of breast cancer, status post left mastectomy.  She is on Eliquis for atrial fibrillation.    Pt was last seen January 2023 by Dr. Trula Slade and at that time, she was not having any neurological symptoms.  Her routine surveillance is with [name] and she was to f/u with Dr. Trula Slade in one year.    The pt returns today for follow up and here with her husband of 67 years and he helps provide information.  They tell me that she had a small wound that started on the left great toe about 6-7 weeks ago.  This has continued to get worse over time.  She is also having rest pain in the left foot where she does not tolerate it up in the bed and had to hang her leg down to get some relief.  They state she had placed a solonapas pad on the top of the foot and it caused a sore that is also not healing.  She has discoloration of the left 2nd toe as well.  She does not have any pain in the right foot.  She states that due to her foot she is really unable to walk due to the pain in the left foot.  She does have hx of back problems as well.    She and her husband state that she has had her veins removed from both legs around 2015 at [place].   She does have some swelling in the right leg.  She has been started on lisinopril.    Her husband states that she had labs drawn at Dr. [name] office and gave [name] a copy.  Her labs from January revealed normal kidney function.  She states she had labs drawn in July and her husband states they were not much different from January but I do not have a copy of these labs.    She  has been very compliant with her Eliquis and they tell me she has not missed any doses.   She is on Eliquis for afib.   The pt is on a statin for cholesterol management.    The pt is not on an aspirin.    Other AC:  Plavix/Eliquis The pt is on CCB, diuretic, BB for hypertension.  The pt does not have diabetes. Tobacco hx:  former   Past Medical History:  Diagnosis Date   Anemia 05/2014.   Atrial fibrillation ([place])    x 3 yrs   Breast cancer ([place])    S/P left mastectomy and chemotherapy 1989 remained in remission   Carotid artery occlusion    Carotid bruit    LICA 74-25% (duplex 9/56)   Degenerative arthritis of spine 2015   Chronic back pain   Hyperlipidemia    Hypertension    Meningioma ([place])    Osteoporosis    Pneumonia May 19, 2014   Stroke [facility])    CVA 2008   Subclavian steal syndrome    Ulcers of both lower extremities ([place]) 2015  Past Surgical History:  Procedure Laterality Date   CARDIAC CATHETERIZATION     CATARACT EXTRACTION Bilateral 2013   COLONOSCOPY  11/17/2010   Procedure: COLONOSCOPY;  Surgeon: [name], MD;  [facility];  Service: Endoscopy;  Laterality: N/A;  10:45 am   ESOPHAGOGASTRODUODENOSCOPY  11/17/2010   Procedure: ESOPHAGOGASTRODUODENOSCOPY (EGD);  Surgeon: [name], MD;  [facility];  Service: Endoscopy;  Laterality: N/A;   ESOPHAGOGASTRODUODENOSCOPY N/A 02/16/2015   Procedure: ESOPHAGOGASTRODUODENOSCOPY (EGD);  Surgeon: [name], MD;  Location: [place];  Service: Gastroenterology;  Laterality: N/A;   EXPLORATORY LAPAROTOMY  1960s   For peritonitis of undetermined cause   EYE SURGERY     LUMBAR EPIDURAL INJECTION  06-2012--06-2013   pt. states she has had 5 epidurals in 06-2012----06-2013   MASTECTOMY Left 9629   YAG LASER APPLICATION Right 09/04/4130   Procedure: YAG LASER APPLICATION;  Surgeon: [name], MD;  Location: [facility];  Service: Ophthalmology;  Laterality: Right;  right    YAG LASER APPLICATION Left 4/40/1027   Procedure: YAG LASER APPLICATION;  Surgeon: [name], MD;  Location: [facility];  Service: Ophthalmology;  Laterality: Left;    Allergies  Allergen Reactions   Iodine Rash and Other (See Comments)    BETADINE Rash/burning, blisters on skin. Burns skin   Iohexol Rash and Other (See Comments)    Blisters; PT NEEDS 13-HOUR PREP    Povidone-Iodine Other (See Comments)    Burns skin   Tape Itching, Rash and Other (See Comments)    Prefers PAPER TAPE Burns skin    Current Outpatient Medications  Medication Sig Dispense Refill   amLODipine (NORVASC) 5 MG tablet TAKE 1 TABLET DAILY 90 tablet 3   calcium-vitamin D (OSCAL) 250-125 MG-UNIT per tablet Take 1 tablet by mouth 2 (two) times daily.     clopidogrel (PLAVIX) 75 MG tablet TAKE 1 TABLET EVERY MORNING 90 tablet 3   denosumab (PROLIA) 60 MG/ML SOSY injection Inject 60 mg into the skin every 6 (six) months.     ELIQUIS 2.5 MG TABS tablet TAKE 1 TABLET TWICE A DAY 180 tablet 1   ferrous sulfate 325 (65 FE) MG tablet Take 325 mg by mouth daily with breakfast.     furosemide (LASIX) 20 MG tablet Take 20 mg by mouth as needed. Only as needed PRN     HYDROcodone-acetaminophen (NORCO/VICODIN) 5-325 MG tablet hydrocodone 5 mg-acetaminophen 325 mg tablet     magnesium oxide (MAG-OX) 400 MG tablet Take 400 mg by mouth every morning.      metoprolol tartrate (LOPRESSOR) 25 MG tablet TAKE 3 TABLETS TWICE A DAY 540 tablet 3   Multiple Vitamin (MULTIVITAMIN WITH MINERALS) TABS tablet Take 1 tablet by mouth every morning.     naloxegol oxalate (MOVANTIK) 25 MG TABS tablet Take 25 mg by mouth daily as needed (constipation).      pantoprazole (PROTONIX) 40 MG tablet TAKE 1 TABLET DAILY 90 tablet 3   pravastatin (PRAVACHOL) 40 MG tablet Take 1 tablet (40 mg total) by mouth daily. 90 tablet 3   Tiotropium Bromide Monohydrate (SPIRIVA RESPIMAT) 1.25 MCG/ACT AERS Inhale 2 puffs into the lungs daily. 4 g 0    Tiotropium Bromide Monohydrate (SPIRIVA RESPIMAT) 2.5 MCG/ACT AERS USE 2 INHALATIONS BY MOUTH  DAILY 12 g 3   vitamin C (ASCORBIC ACID) 500 MG tablet Take 500 mg by mouth every morning.      No current facility-administered medications for this visit.  Family History  Problem Relation Age of Onset   Alzheimer's disease Mother    Dementia Mother    Hypertension Mother    Hyperlipidemia Mother    Parkinsonism Father     Social History   Socioeconomic History   Marital status: Married    Spouse name: Not on file   Number of children: 2   Years of education: Not on file   Highest education level: Not on file  Occupational History   Occupation: Retired    Fish farm manager: RETIRED  Tobacco Use   Smoking status: Former    Packs/day: 1.00    Years: 56.00    Total pack years: 56.00    Types: Cigarettes    Quit date: 08/14/2006    Years since quitting: 15.3   Smokeless tobacco: Never   Tobacco comments:    quit in 2008  Vaping Use   Vaping Use: Never used  Substance and Sexual Activity   Alcohol use: No    Alcohol/week: 0.0 standard drinks of alcohol   Drug use: No   Sexual activity: Never  Other Topics Concern   Not on file  Social History Narrative   Not on file   Social Determinants of Health   Financial Resource Strain: Not on file  Food Insecurity: Not on file  Transportation Needs: Not on file  Physical Activity: Not on file  Stress: Not on file  Social Connections: Not on file  Intimate Partner Violence: Not on file     REVIEW OF SYSTEMS:   [X]  denotes positive finding, [ ]  denotes negative finding Cardiac  Comments:  Chest pain or chest pressure:    Shortness of breath upon exertion:    Short of breath when lying flat:    Irregular heart rhythm:        Vascular    Pain in calf, thigh, or hip brought on by ambulation: x   Pain in feet at night that wakes you up from your sleep:  x   Blood clot in your veins:    Leg swelling:  x       Pulmonary     Oxygen at home:    Productive cough:     Wheezing:         Neurologic    Sudden weakness in arms or legs:     Sudden numbness in arms or legs:     Sudden onset of difficulty speaking or slurred speech:    Temporary loss of vision in one eye:     Problems with dizziness:         Gastrointestinal    Blood in stool:     Vomited blood:         Genitourinary    Burning when urinating:     Blood in urine:        Psychiatric    Major depression:         Hematologic    Bleeding problems:    Problems with blood clotting too easily:        Skin    Rashes or ulcers:        Constitutional    Fever or chills:      PHYSICAL EXAMINATION:  Today's Vitals   12/15/21 1133  BP: 105/66  Pulse: 60  Resp: 20  Temp: 98 F (36.7 C)  TempSrc: Temporal  SpO2: 95%  Height: 5\' 2"  (1.575 m)  PainSc: 8    Body mass index is 20.63 kg/m.   General:  WDWN in NAD; vital signs documented above Gait: Not observed HENT: WNL, normocephalic Pulmonary: normal non-labored breathing , without wheezing Cardiac: irregular HR Abdomen: soft Skin: without rashes Vascular Exam/Pulses:  Right Left  Radial 2+ (normal) 2+ (normal)  Femoral 2+ (normal) 2+ (normal)  DP Brisk biphasic doppler Unable to obtain  PT Brisk biphasic doppler Unable to obtain  Peroneal monophasic Unable to obtain    Extremities:  Ischemic changes to left foot; wound to lateral left great toe; ischemic changes to left plantar surface of the left 2nd toe.  Motor is not in tact as she is unable to wiggle left toes.     Left foot  Left foot  Left foot   Musculoskeletal: no muscle wasting or atrophy  Neurologic: A&O X 3 Psychiatric:  The pt has Normal affect.   Non-Invasive Vascular Imaging:   ABI's/TBI's on 12/15/2021: Right:  1.05/0.75  (B)- Great toe pressure: 95 Left:  0.33/bandage - Great toe pressure: bandage  Previous ABI 12/24/2014 Right: 0.86 (T) Left:  1.14 (T)  ASSESSMENT/PLAN:: 81 y.o. female here  for follow up for PAD with hx of carotid stenosis, innominate occlusion but presents today for evaluation for left foot pain with non healing wound.    -pt LLE critical limb ischemia with ABI of 33% but unable to get doppler signals on exam on the left foot and she has non healing wounds and ischemic changes and rest pain left foot.  She does have palpable bilateral femoral pulses.  -will plan for angiogram with runoff and possible intervention on Tuesday with Dr. Trula Slade.  Discussed with pt that she is at risk of toe amputation and possibly more proximal amputation.   -I have asked them not to place any heat or pads on the left foot and keep it clean and protected.   -she is on Eliquis for afib.  Our RN will call to make sure okay to hold for procedure.   -she has been started on lisinopril since her last visit as well   Leontine Locket, [facility] [phone]  Clinic MD:   Virl Cagey on call MD

## 2021-12-15 ENCOUNTER — Telehealth: Payer: Self-pay | Admitting: *Deleted

## 2021-12-15 ENCOUNTER — Encounter (INDEPENDENT_AMBULATORY_CARE_PROVIDER_SITE_OTHER): Payer: Medicare HMO | Admitting: Physician Assistant

## 2021-12-15 ENCOUNTER — Ambulatory Visit (HOSPITAL_COMMUNITY)
Admission: RE | Admit: 2021-12-15 | Discharge: 2021-12-15 | Disposition: A | Discharge status: Home / Self Care | Payer: Medicare HMO | Source: Ambulatory Visit | Attending: Vascular Surgery | Admitting: Vascular Surgery

## 2021-12-15 ENCOUNTER — Ambulatory Visit (INDEPENDENT_AMBULATORY_CARE_PROVIDER_SITE_OTHER): Payer: Medicare HMO | Admitting: Physician Assistant

## 2021-12-15 ENCOUNTER — Telehealth: Payer: Self-pay | Admitting: Cardiovascular Disease

## 2021-12-15 ENCOUNTER — Other Ambulatory Visit: Payer: Self-pay

## 2021-12-15 VITALS — BP 105/66 | HR 60 | Temp 98.0°F | Resp 20 | Ht 62.0 in

## 2021-12-15 DIAGNOSIS — I70223 Atherosclerosis of native arteries of extremities with rest pain, bilateral legs: Secondary | ICD-10-CM

## 2021-12-15 DIAGNOSIS — M79605 Pain in left leg: Secondary | ICD-10-CM | POA: Insufficient documentation

## 2021-12-15 DIAGNOSIS — Z01810 Encounter for preprocedural cardiovascular examination: Secondary | ICD-10-CM

## 2021-12-15 DIAGNOSIS — M79604 Pain in right leg: Secondary | ICD-10-CM | POA: Diagnosis present

## 2021-12-15 DIAGNOSIS — I70222 Atherosclerosis of native arteries of extremities with rest pain, left leg: Secondary | ICD-10-CM

## 2021-12-15 NOTE — Telephone Encounter (Signed)
S/w the pt's husband , he is agreeable to tele pre op appt add on per pre op provider at 3 pm for urgent surgery. Request was just received today. Meds have not been review and will need to be reviewed during tele appt. Consent  has been given.      Patient Consent for Virtual Visit        [name] has provided verbal consent on 12/15/2021 for a virtual visit (video or telephone).   CONSENT FOR VIRTUAL VISIT FOR:  [name]  By participating in this virtual visit I agree to the following:  I hereby voluntarily request, consent and authorize [facility] and its employed or contracted physicians, physician assistants, nurse practitioners or other licensed health care professionals (the Practitioner), to provide me with telemedicine health care services (the "Services") as deemed necessary by the treating Practitioner. I acknowledge and consent to receive the Services by the Practitioner via telemedicine. I understand that the telemedicine visit will involve communicating with the Practitioner through live audiovisual communication technology and the disclosure of certain medical information by electronic transmission. I acknowledge that I have been given the opportunity to request an in-person assessment or other available alternative prior to the telemedicine visit and am voluntarily participating in the telemedicine visit.  I understand that I have the right to withhold or withdraw my consent to the use of telemedicine in the course of my care at any time, without affecting my right to future care or treatment, and that the Practitioner or I may terminate the telemedicine visit at any time. I understand that I have the right to inspect all information obtained and/or recorded in the course of the telemedicine visit and may receive copies of available information for a reasonable fee.  I understand that some of the potential risks of receiving the Services via telemedicine include:   Delay or interruption in medical evaluation due to technological equipment failure or disruption; Information transmitted may not be sufficient (e.g. poor resolution of images) to allow for appropriate medical decision making by the Practitioner; and/or  In rare instances, security protocols could fail, causing a breach of personal health information.  Furthermore, I acknowledge that it is my responsibility to provide information about my medical history, conditions and care that is complete and accurate to the best of my ability. I acknowledge that Practitioner's advice, recommendations, and/or decision may be based on factors not within their control, such as incomplete or inaccurate data provided by me or distortions of diagnostic images or specimens that may result from electronic transmissions. I understand that the practice of medicine is not an exact science and that Practitioner makes no warranties or guarantees regarding treatment outcomes. I acknowledge that a copy of this consent can be made available to me via my patient portal ([facility]), or I can request a printed copy by calling the office of [facility].    I understand that my insurance will be billed for this visit.   I have read or had this consent read to me. I understand the contents of this consent, which adequately explains the benefits and risks of the Services being provided via telemedicine.  I have been provided ample opportunity to ask questions regarding this consent and the Services and have had my questions answered to my satisfaction. I give my informed consent for the services to be provided through the use of telemedicine in my medical care

## 2021-12-15 NOTE — Telephone Encounter (Signed)
S/w the pt's husband , he is agreeable to tele pre op appt add on per pre op provider at 3 pm for urgent surgery. Request was just received today. Meds have not been review and will need to be reviewed during tele appt. Consent  has been given.

## 2021-12-15 NOTE — Telephone Encounter (Signed)
Patient with diagnosis of A Fib on Eliquis for anticoagulation.    Procedure: ABDOMINAL AORTOGRAM WITH B/L LOWER EXTREMITY RUNOFF AND POSSIBLE INTERVENTION FOR CRITICAL LIMB ISCHEMIA   Date of procedure: 12/19/21   CHA2DS2-VASc Score = 6  This indicates a 9.7% annual risk of stroke. The patient's score is based upon: CHF History: 1 HTN History: 1 Diabetes History: 0 Stroke History: 0 Vascular Disease History: 1 Age Score: 2 Gender Score: 1   CrCl 40 mL/min Platelet count 262K   Per office protocol, patient can hold Eliquis for 2 days prior to procedure.    **This guidance is not considered finalized until pre-operative APP has relayed final recommendations.**

## 2021-12-15 NOTE — Telephone Encounter (Signed)
Pt has tele pre op appt today at 3 pm, will address pre op then

## 2021-12-15 NOTE — Telephone Encounter (Signed)
Kea from [facility] is calling to get confirmation of fax they sent for this patient for procedure she is having done on 09/12.

## 2021-12-15 NOTE — Telephone Encounter (Signed)
   Pre-operative Risk Assessment    Patient Name: [name]  DOB: 21-Nov-1940 MRN: [id number]      Request for Surgical Clearance    Procedure:   ABDOMINAL AORTOGRAM WITH B/L LOWER EXTREMITY RUNOFF AND POSSIBLE INTERVENTION FOR CRITICAL LIMB ISCHEMIA  Date of Surgery:  Clearance 12/19/21 STAT                                Surgeon:  DR. Trula Slade Surgeon's Group or Practice Name:  [facility] Phone number:  [phone] Fax number:  [phone]   Type of Clearance Requested:   - Medical  - Pharmacy:  Hold Apixaban (Eliquis) x 2 DAYS PRIOR   Type of Anesthesia:  Local    Additional requests/questions:    Jiles Prows   12/15/2021, 1:06 PM

## 2021-12-15 NOTE — Progress Notes (Addendum)
Virtual Visit via Telephone Note   Because of [name]'s co-morbid illnesses, she is at least at moderate risk for complications without adequate follow up.  This format is felt to be most appropriate for this patient at this time.  The patient did not have access to video technology/had technical difficulties with video requiring transitioning to audio format only (telephone).  All issues noted in this document were discussed and addressed.  No physical exam could be performed with this format.  Please refer to the patient's chart for her consent to telehealth for [facility].  Evaluation Performed:  Preoperative cardiovascular risk assessment _____________   Date:  12/15/2021   Patient ID:  [name], DOB 1940/07/11, MRN [id number] Patient Location:  Home Provider location:   Office  Primary Care Provider:  Celene Squibb, MD Primary Cardiologist:  Jenkins Rouge, MD  Chief Complaint / Patient Profile   81 y.o. y/o female with a h/o atrial fibrillation, carotid artery occlusion, breast cancer status post left mastectomy and chemotherapy in 1989, hypertension, hyperlipidemia, CVA 2008 who is pending abdominal aortogram with bilateral lower extremity runoff with possible intervention for critical limb ischemia and presents today for telephonic preoperative cardiovascular risk assessment.  Past Medical History    Past Medical History:  Diagnosis Date   Anemia 05/2014.   Atrial fibrillation ([place])    x 3 yrs   Breast cancer ([place])    S/P left mastectomy and chemotherapy 1989 remained in remission   Carotid artery occlusion    Carotid bruit    LICA 37-16% (duplex 9/67)   Degenerative arthritis of spine 2015   Chronic back pain   Hyperlipidemia    Hypertension    Meningioma ([place])    Osteoporosis    Pneumonia May 19, 2014   Stroke [facility])    CVA 2008   Subclavian steal syndrome    Ulcers of both lower extremities ([place]) 2015   Past Surgical History:  Procedure  Laterality Date   CARDIAC CATHETERIZATION     CATARACT EXTRACTION Bilateral 2013   COLONOSCOPY  11/17/2010   Procedure: COLONOSCOPY;  Surgeon: [name], MD;  [facility];  Service: Endoscopy;  Laterality: N/A;  10:45 am   ESOPHAGOGASTRODUODENOSCOPY  11/17/2010   Procedure: ESOPHAGOGASTRODUODENOSCOPY (EGD);  Surgeon: [name], MD;  [facility];  Service: Endoscopy;  Laterality: N/A;   ESOPHAGOGASTRODUODENOSCOPY N/A 02/16/2015   Procedure: ESOPHAGOGASTRODUODENOSCOPY (EGD);  Surgeon: [name], MD;  Location: [facility];  Service: Gastroenterology;  Laterality: N/A;   EXPLORATORY LAPAROTOMY  1960s   For peritonitis of undetermined cause   EYE SURGERY     LUMBAR EPIDURAL INJECTION  06-2012--06-2013   pt. states she has had 5 epidurals in 06-2012----06-2013   MASTECTOMY Left 8938   YAG LASER APPLICATION Right 04/09/7508   Procedure: YAG LASER APPLICATION;  Surgeon: [name], MD;  Location: [facility];  Service: Ophthalmology;  Laterality: Right;  right   YAG LASER APPLICATION Left 2/58/5277   Procedure: YAG LASER APPLICATION;  Surgeon: [name], MD;  Location: [facility];  Service: Ophthalmology;  Laterality: Left;    Allergies  Allergies  Allergen Reactions   Iodine Rash and Other (See Comments)    BETADINE Rash/burning, blisters on skin. Burns skin   Iohexol Rash and Other (See Comments)    Blisters; PT NEEDS 13-HOUR PREP    Povidone-Iodine Other (See Comments)    Burns skin   Tape Itching, Rash and Other (See Comments)  Prefers PAPER TAPE Burns skin    History of Present Illness    [name] is a 81 y.o. female who presents via [facility] (consulting) for a telehealth visit today.  Pt was last seen in cardiology clinic on 06/08/2021 by [name].  At that time [name] was doing well.  The patient is now pending procedure as outlined above. Since her last visit, she tells me that she is not able to walk 1-2 blocks or go  up and down stairs at all.  This is all due to her foot that has been giving her trouble.  She was able to do all of these things before last week when this all started.  She is hoping Dr. Trula Slade can get to the bottom of it and fix the problem.  Otherwise, she denies any shortness of breath and chest pain.  She states that her blood pressures been well controlled and is around 408 systolic.  She can hold Eliquis 2 days prior to her procedure.  Please restart your Eliquis when medically safe to do so.  Home Medications    Prior to Admission medications   Medication Sig Start Date End Date Taking? Authorizing Provider  amLODipine (NORVASC) 5 MG tablet TAKE 1 TABLET DAILY 05/11/21   Josue Hector, MD  calcium-vitamin D (OSCAL) 250-125 MG-UNIT per tablet Take 1 tablet by mouth 2 (two) times daily.    [provider]  clopidogrel (PLAVIX) 75 MG tablet TAKE 1 TABLET EVERY MORNING 08/31/21   Josue Hector, MD  denosumab (PROLIA) 60 MG/ML SOSY injection Inject 60 mg into the skin every 6 (six) months.    Celene Squibb, MD  ELIQUIS 2.5 MG TABS tablet TAKE 1 TABLET TWICE A DAY 11/23/21   Josue Hector, MD  ferrous sulfate 325 (65 FE) MG tablet Take 325 mg by mouth daily with breakfast.    [provider]  furosemide (LASIX) 20 MG tablet Take 20 mg by mouth as needed. Only as needed PRN    [provider]  HYDROcodone-acetaminophen (NORCO/VICODIN) 5-325 MG tablet hydrocodone 5 mg-acetaminophen 325 mg tablet    [provider]  magnesium oxide (MAG-OX) 400 MG tablet Take 400 mg by mouth every morning.     [provider]  metoprolol tartrate (LOPRESSOR) 25 MG tablet TAKE 3 TABLETS TWICE A DAY 10/06/21   Josue Hector, MD  Multiple Vitamin (MULTIVITAMIN WITH MINERALS) TABS tablet Take 1 tablet by mouth every morning.    [provider]  naloxegol oxalate (MOVANTIK) 25 MG TABS tablet Take 25 mg by mouth daily as needed (constipation).     [provider]  pantoprazole (PROTONIX) 40 MG tablet TAKE 1 TABLET DAILY 08/31/21   Josue Hector, MD  pravastatin (PRAVACHOL) 40 MG tablet Take 1 tablet (40 mg total) by mouth daily. 07/20/21   Josue Hector, MD  Tiotropium Bromide Monohydrate (SPIRIVA RESPIMAT) 1.25 MCG/ACT AERS Inhale 2 puffs into the lungs daily. 10/25/21   Cobb, Karie Schwalbe, NP  Tiotropium Bromide Monohydrate (SPIRIVA RESPIMAT) 2.5 MCG/ACT AERS USE 2 INHALATIONS BY MOUTH  DAILY 10/25/21   Cobb, Karie Schwalbe, NP  vitamin C (ASCORBIC ACID) 500 MG tablet Take 500 mg by mouth every morning.     [provider]    Physical Exam    Vital Signs:  [name] does not have vital signs available for review today.  Given telephonic nature of communication, physical exam is limited. AAOx3.  NAD. Normal affect.  Speech and respirations are unlabored.  Accessory Clinical Findings    None  Assessment & Plan    1.  Preoperative Cardiovascular Risk Assessment:  [name] perioperative risk of a major cardiac event is 6.6% according to the Revised Cardiac Risk Index (RCRI).  Therefore, she is at high risk for perioperative complications.   Her functional capacity is poor at 3.3 METs according to the [facility] Activity Status Index (DASI). Recommendations: Discussed with [name]. No further cardiac testing required at this time. Okay to proceed with surgery.   Antiplatelet and/or Anticoagulation Recommendations:  Eliquis (Apixaban) can be held for 2 days prior to surgery.  Please resume post op when felt to be safe.     A copy of this note will be routed to requesting surgeon.  Time:   Today, I have spent 10 minutes with the patient with telehealth technology discussing medical history, symptoms, and management plan.     Elgie Collard, PA-C  12/15/2021, 2:35 PM

## 2021-12-18 ENCOUNTER — Encounter (HOSPITAL_COMMUNITY): Payer: Medicare HMO

## 2021-12-18 ENCOUNTER — Ambulatory Visit: Payer: Medicare HMO

## 2021-12-19 ENCOUNTER — Encounter (HOSPITAL_COMMUNITY): Payer: Self-pay | Admitting: Surgery

## 2021-12-19 ENCOUNTER — Encounter (HOSPITAL_COMMUNITY)
Admission: RE | Disposition: A | Discharge status: Home / Self Care | Payer: Self-pay | Source: Home / Self Care | Attending: Surgery

## 2021-12-19 ENCOUNTER — Other Ambulatory Visit: Payer: Self-pay

## 2021-12-19 ENCOUNTER — Observation Stay (HOSPITAL_COMMUNITY)
Admission: RE | Admit: 2021-12-19 | Discharge: 2021-12-20 | Disposition: A | Discharge status: Home / Self Care | Payer: Medicare HMO | Attending: Surgery | Admitting: Surgery

## 2021-12-19 DIAGNOSIS — I739 Peripheral vascular disease, unspecified: Secondary | ICD-10-CM | POA: Diagnosis present

## 2021-12-19 DIAGNOSIS — D689 Coagulation defect, unspecified: Secondary | ICD-10-CM

## 2021-12-19 DIAGNOSIS — Z7901 Long term (current) use of anticoagulants: Secondary | ICD-10-CM | POA: Insufficient documentation

## 2021-12-19 DIAGNOSIS — Z8673 Personal history of transient ischemic attack (TIA), and cerebral infarction without residual deficits: Secondary | ICD-10-CM | POA: Insufficient documentation

## 2021-12-19 DIAGNOSIS — I779 Disorder of arteries and arterioles, unspecified: Secondary | ICD-10-CM | POA: Diagnosis not present

## 2021-12-19 DIAGNOSIS — I2510 Atherosclerotic heart disease of native coronary artery without angina pectoris: Secondary | ICD-10-CM | POA: Insufficient documentation

## 2021-12-19 DIAGNOSIS — I4891 Unspecified atrial fibrillation: Secondary | ICD-10-CM | POA: Insufficient documentation

## 2021-12-19 DIAGNOSIS — Z7902 Long term (current) use of antithrombotics/antiplatelets: Secondary | ICD-10-CM | POA: Diagnosis not present

## 2021-12-19 DIAGNOSIS — Z79899 Other long term (current) drug therapy: Secondary | ICD-10-CM | POA: Diagnosis not present

## 2021-12-19 DIAGNOSIS — I70222 Atherosclerosis of native arteries of extremities with rest pain, left leg: Secondary | ICD-10-CM | POA: Diagnosis not present

## 2021-12-19 DIAGNOSIS — I70223 Atherosclerosis of native arteries of extremities with rest pain, bilateral legs: Secondary | ICD-10-CM

## 2021-12-19 DIAGNOSIS — J449 Chronic obstructive pulmonary disease, unspecified: Secondary | ICD-10-CM | POA: Insufficient documentation

## 2021-12-19 DIAGNOSIS — Z87891 Personal history of nicotine dependence: Secondary | ICD-10-CM | POA: Insufficient documentation

## 2021-12-19 DIAGNOSIS — Z853 Personal history of malignant neoplasm of breast: Secondary | ICD-10-CM | POA: Diagnosis not present

## 2021-12-19 DIAGNOSIS — I110 Hypertensive heart disease with heart failure: Secondary | ICD-10-CM | POA: Insufficient documentation

## 2021-12-19 DIAGNOSIS — I70245 Atherosclerosis of native arteries of left leg with ulceration of other part of foot: Secondary | ICD-10-CM

## 2021-12-19 DIAGNOSIS — I5033 Acute on chronic diastolic (congestive) heart failure: Secondary | ICD-10-CM | POA: Insufficient documentation

## 2021-12-19 HISTORY — PX: PERIPHERAL VASCULAR INTERVENTION: CATH118257

## 2021-12-19 HISTORY — PX: ABDOMINAL AORTOGRAM W/LOWER EXTREMITY: CATH118223

## 2021-12-19 LAB — POCT ACTIVATED CLOTTING TIME
Activated Clotting Time: 269 seconds
Activated Clotting Time: 263 seconds
Activated Clotting Time: 269 seconds
Activated Clotting Time: 215 seconds
Activated Clotting Time: 191 seconds
Activated Clotting Time: 197 seconds
Activated Clotting Time: 179 seconds

## 2021-12-19 LAB — POCT I-STAT, CHEM 8
BUN: 36 mg/dL — ABNORMAL HIGH (ref 8–23)
BUN: 45 mg/dL — ABNORMAL HIGH (ref 8–23)
Calcium, Ion: 1.17 mmol/L (ref 1.15–1.40)
Calcium, Ion: 1.19 mmol/L (ref 1.15–1.40)
Chloride: 99 mmol/L (ref 98–111)
Chloride: 99 mmol/L (ref 98–111)
Creatinine, Ser: 1.1 mg/dL — ABNORMAL HIGH (ref 0.44–1.00)
Creatinine, Ser: 1.2 mg/dL — ABNORMAL HIGH (ref 0.44–1.00)
Glucose, Bld: 77 mg/dL (ref 70–99)
Glucose, Bld: 88 mg/dL (ref 70–99)
HCT: 36 % (ref 36.0–46.0)
HCT: 37 % (ref 36.0–46.0)
Hemoglobin: 12.2 g/dL (ref 12.0–15.0)
Hemoglobin: 12.6 g/dL (ref 12.0–15.0)
Potassium: 4.7 mmol/L (ref 3.5–5.1)
Potassium: 6 mmol/L — ABNORMAL HIGH (ref 3.5–5.1)
Sodium: 135 mmol/L (ref 135–145)
Sodium: 135 mmol/L (ref 135–145)
TCO2: 27 mmol/L (ref 22–32)
TCO2: 27 mmol/L (ref 22–32)

## 2021-12-19 SURGERY — ABDOMINAL AORTOGRAM W/LOWER EXTREMITY
Anesthesia: LOCAL

## 2021-12-19 MED ORDER — ONDANSETRON HCL 4 MG/2ML IJ SOLN: 4.0000 mg | Freq: Four times a day (QID) | INTRAMUSCULAR | Status: DC | PRN

## 2021-12-19 MED ORDER — HEPARIN (PORCINE) IN NACL 1000-0.9 UT/500ML-% IV SOLN
INTRAVENOUS | Status: AC
  Filled 2021-12-19: qty 500

## 2021-12-19 MED ORDER — HYDROMORPHONE HCL 1 MG/ML IJ SOLN
INTRAMUSCULAR | Status: AC
  Filled 2021-12-19: qty 0.5

## 2021-12-19 MED ORDER — HEPARIN SODIUM (PORCINE) 1000 UNIT/ML IJ SOLN
INTRAMUSCULAR | Status: DC | PRN
  Administered 2021-12-19: 5000 [IU] via INTRAVENOUS

## 2021-12-19 MED ORDER — SULFAMETHOXAZOLE-TRIMETHOPRIM 800-160 MG PO TABS: 1.0000 | ORAL_TABLET | Freq: Two times a day (BID) | ORAL | 0 refills | Status: DC

## 2021-12-19 MED ORDER — LIDOCAINE HCL (PF) 1 % IJ SOLN
INTRAMUSCULAR | Status: DC | PRN
  Administered 2021-12-19: 12 mL

## 2021-12-19 MED ORDER — OXYCODONE HCL 5 MG PO TABS
5.0000 mg | ORAL_TABLET | ORAL | Status: DC | PRN
  Administered 2021-12-19: 5 mg via ORAL
  Filled 2021-12-19: qty 2
  Filled 2021-12-19: qty 1

## 2021-12-19 MED ORDER — DIPHENHYDRAMINE HCL 50 MG/ML IJ SOLN
25.0000 mg | INTRAMUSCULAR | Status: AC
  Administered 2021-12-19: 25 mg via INTRAVENOUS
  Filled 2021-12-19: qty 1

## 2021-12-19 MED ORDER — IODIXANOL 320 MG/ML IV SOLN
INTRAVENOUS | Status: DC | PRN
  Administered 2021-12-19: 140 mL

## 2021-12-19 MED ORDER — CLOPIDOGREL BISULFATE 75 MG PO TABS
75.0000 mg | ORAL_TABLET | Freq: Every morning | ORAL | Status: DC
  Administered 2021-12-20: 75 mg via ORAL
  Filled 2021-12-19: qty 1

## 2021-12-19 MED ORDER — AMLODIPINE BESYLATE 5 MG PO TABS
5.0000 mg | ORAL_TABLET | Freq: Every day | ORAL | Status: DC
  Administered 2021-12-19 – 2021-12-20 (×2): 5 mg via ORAL
  Filled 2021-12-19 (×2): qty 1

## 2021-12-19 MED ORDER — INFLUENZA VAC A&B SA ADJ QUAD 0.5 ML IM PRSY
0.5000 mL | PREFILLED_SYRINGE | INTRAMUSCULAR | Status: DC
  Filled 2021-12-19: qty 0.5

## 2021-12-19 MED ORDER — METOPROLOL TARTRATE 5 MG/5ML IV SOLN: 2.0000 mg | INTRAVENOUS | Status: DC | PRN

## 2021-12-19 MED ORDER — MIDAZOLAM HCL 2 MG/2ML IJ SOLN
INTRAMUSCULAR | Status: AC
  Filled 2021-12-19: qty 2

## 2021-12-19 MED ORDER — METOPROLOL TARTRATE 50 MG PO TABS
75.0000 mg | ORAL_TABLET | Freq: Two times a day (BID) | ORAL | Status: DC
  Administered 2021-12-19 – 2021-12-20 (×2): 75 mg via ORAL
  Filled 2021-12-19 (×2): qty 1

## 2021-12-19 MED ORDER — METHYLPREDNISOLONE SODIUM SUCC 125 MG IJ SOLR
125.0000 mg | INTRAMUSCULAR | Status: AC
  Administered 2021-12-19: 125 mg via INTRAVENOUS
  Filled 2021-12-19: qty 2

## 2021-12-19 MED ORDER — ACETAMINOPHEN 325 MG PO TABS: 650.0000 mg | ORAL_TABLET | ORAL | Status: DC | PRN

## 2021-12-19 MED ORDER — MORPHINE SULFATE (PF) 2 MG/ML IV SOLN
INTRAVENOUS | Status: AC
  Filled 2021-12-19: qty 1

## 2021-12-19 MED ORDER — HEPARIN (PORCINE) IN NACL 1000-0.9 UT/500ML-% IV SOLN
INTRAVENOUS | Status: DC | PRN
  Administered 2021-12-19 (×3): 500 mL

## 2021-12-19 MED ORDER — ACETAMINOPHEN 325 MG PO TABS: 325.0000 mg | ORAL_TABLET | ORAL | Status: DC | PRN

## 2021-12-19 MED ORDER — MORPHINE SULFATE (PF) 2 MG/ML IV SOLN
2.0000 mg | INTRAVENOUS | Status: DC | PRN
  Administered 2021-12-19: 2 mg via INTRAVENOUS

## 2021-12-19 MED ORDER — PRAVASTATIN SODIUM 40 MG PO TABS
40.0000 mg | ORAL_TABLET | Freq: Every day | ORAL | Status: DC
  Administered 2021-12-19 – 2021-12-20 (×2): 40 mg via ORAL
  Filled 2021-12-19 (×2): qty 1

## 2021-12-19 MED ORDER — GUAIFENESIN-DM 100-10 MG/5ML PO SYRP: 15.0000 mL | ORAL_SOLUTION | ORAL | Status: DC | PRN

## 2021-12-19 MED ORDER — SODIUM CHLORIDE 0.9 % IV SOLN: INTRAVENOUS | Status: DC

## 2021-12-19 MED ORDER — HEPARIN SODIUM (PORCINE) 1000 UNIT/ML IJ SOLN
INTRAMUSCULAR | Status: AC
  Filled 2021-12-19: qty 10

## 2021-12-19 MED ORDER — FENTANYL CITRATE (PF) 100 MCG/2ML IJ SOLN
INTRAMUSCULAR | Status: AC
  Filled 2021-12-19: qty 2

## 2021-12-19 MED ORDER — HYDRALAZINE HCL 20 MG/ML IJ SOLN: 5.0000 mg | INTRAMUSCULAR | Status: DC | PRN

## 2021-12-19 MED ORDER — LABETALOL HCL 5 MG/ML IV SOLN: 10.0000 mg | INTRAVENOUS | Status: DC | PRN

## 2021-12-19 MED ORDER — MIDAZOLAM HCL 2 MG/2ML IJ SOLN
INTRAMUSCULAR | Status: DC | PRN
  Administered 2021-12-19: 2 mg via INTRAVENOUS

## 2021-12-19 MED ORDER — HYDROMORPHONE HCL 1 MG/ML IJ SOLN
INTRAMUSCULAR | Status: DC | PRN
  Administered 2021-12-19 (×2): .5 mg via INTRAVENOUS

## 2021-12-19 MED ORDER — PANTOPRAZOLE SODIUM 40 MG PO TBEC
40.0000 mg | DELAYED_RELEASE_TABLET | Freq: Every day | ORAL | Status: DC
  Administered 2021-12-19 – 2021-12-20 (×2): 40 mg via ORAL
  Filled 2021-12-19 (×2): qty 1

## 2021-12-19 MED ORDER — OXYCODONE-ACETAMINOPHEN 5-325 MG PO TABS: 1.0000 | ORAL_TABLET | ORAL | Status: DC | PRN

## 2021-12-19 MED ORDER — ALUM & MAG HYDROXIDE-SIMETH 200-200-20 MG/5ML PO SUSP: 15.0000 mL | ORAL | Status: DC | PRN

## 2021-12-19 MED ORDER — SODIUM CHLORIDE 0.9 % WEIGHT BASED INFUSION
1.0000 mL/kg/h | INTRAVENOUS | Status: AC
  Administered 2021-12-19: 1 mL/kg/h via INTRAVENOUS

## 2021-12-19 MED ORDER — ACETAMINOPHEN 650 MG RE SUPP: 325.0000 mg | RECTAL | Status: DC | PRN

## 2021-12-19 MED ORDER — PHENOL 1.4 % MT LIQD: 1.0000 | OROMUCOSAL | Status: DC | PRN

## 2021-12-19 MED ORDER — FENTANYL CITRATE (PF) 100 MCG/2ML IJ SOLN
INTRAMUSCULAR | Status: DC | PRN
  Administered 2021-12-19: 50 ug via INTRAVENOUS

## 2021-12-19 SURGICAL SUPPLY — 28 items
BALL STERLING OTW 2.5X150X150 (BALLOONS) ×2
BALLN MUSTANG 4X150X135 (BALLOONS) ×2
BALLN MUSTANG 5X150X135 (BALLOONS) ×2
BALLN STERLING OTW 2.5X150X150 (BALLOONS) ×2
BALLOON MUSTANG 4X150X135 (BALLOONS) IMPLANT
BALLOON MUSTANG 5X150X135 (BALLOONS) IMPLANT
BALLOON STRLNG OTW 2.5X150X150 (BALLOONS) IMPLANT
CATH ANGIO 5F BER2 65CM (CATHETERS) IMPLANT
CATH OMNI FLUSH 5F 65CM (CATHETERS) IMPLANT
CATH QUICKCROSS SUPP .035X90CM (MICROCATHETER) IMPLANT
DCB RANGER 4.0X100 135 (BALLOONS) IMPLANT
DEVICE CONTINUOUS FLUSH (MISCELLANEOUS) IMPLANT
DEVICE TORQUE H2O (MISCELLANEOUS) IMPLANT
GUIDEWIRE ANGLED .035X260CM (WIRE) IMPLANT
KIT ENCORE 26 ADVANTAGE (KITS) IMPLANT
KIT MICROPUNCTURE NIT STIFF (SHEATH) IMPLANT
KIT PV (KITS) ×2 IMPLANT
RANGER DCB 4.0X100 135 (BALLOONS) ×2
SHEATH PINNACLE 5F 10CM (SHEATH) IMPLANT
SHEATH PINNACLE ST 6F 45CM (SHEATH) IMPLANT
STENT ELUVIA 6X150X130 (Permanent Stent) IMPLANT
STENT ELUVIA 6X60X130 (Permanent Stent) IMPLANT
SYR MEDRAD MARK V 150ML (SYRINGE) IMPLANT
TRANSDUCER W/STOPCOCK (MISCELLANEOUS) ×2 IMPLANT
TRAY PV CATH (CUSTOM PROCEDURE TRAY) ×2 IMPLANT
WIRE BENTSON .035X145CM (WIRE) IMPLANT
WIRE G V18X300CM (WIRE) IMPLANT
WIRE HI TORQ VERSACORE 300 (WIRE) IMPLANT

## 2021-12-19 NOTE — Progress Notes (Signed)
Site area: right groin  Site Prior to Removal:  Level 0  Pressure Applied For 35 MINUTES    Minutes Beginning at [place]:   Yes.    Patient Status During Pull:  Stable  Post Pull Groin Site:  Level 0  Post Pull Instructions Given:  Yes.    Post Pull Pulses Present:  Yes.    Dressing Applied:  Yes.    Comments:  Bed rest started at 1915 X 4 hr.

## 2021-12-19 NOTE — Op Note (Signed)
Patient name: [name] MRN: [id number] DOB: 1940/07/31 Sex: female  12/19/2021 Pre-operative Diagnosis: Left leg ischemia Post-operative diagnosis:  Same Surgeon:  Annamarie Major Procedure Performed:  1.  Ultrasound-guided access, right femoral artery  2.  Aortogram  3.  Left lower extremity runoff  4.  Stent, left superficial femoral artery  5.  Drug-coated balloon angioplasty, left popliteal artery  6.  Angioplasty, left peroneal artery  7.  Conscious sedation, 102 minutes   Indications: This is an 81 year old female with critical limb ischemia to the left foot.  She comes in today for angiogram and possible invention  Procedure:  The patient was identified in the holding area and taken to room 8.  The patient was then placed supine on the table and prepped and draped in the usual sterile fashion.  A time out was called.  Conscious sedation was administered with the use of IV fentanyl and Versed under continuous physician and nurse monitoring.  Heart rate, blood pressure, and oxygen saturation were continuously monitored.  Total sedation time was 102 minutes.  Ultrasound was used to evaluate the right common femoral artery.  It was patent .  A digital ultrasound image was acquired.  A micropuncture needle was used to access the right common femoral artery under ultrasound guidance.  An 018 wire was advanced without resistance and a micropuncture sheath was placed.  The 018 wire was removed and a benson wire was placed.  The micropuncture sheath was exchanged for a 5 french sheath.  An omniflush catheter was advanced over the wire to the level of L-1.  An abdominal angiogram was obtained.  Next, using the omniflush catheter and a benson wire, the aortic bifurcation was crossed and the catheter was placed into theleft external iliac artery and left runoff was obtained.  Findings:   Aortogram: No significant renal artery stenosis was identified.  The infrarenal abdominal aorta is calcified  but patent without significant stenosis.  Bilateral common and external iliac arteries are widely patent  Right Lower Extremity: Not evaluated  Left Lower Extremity: The left common femoral artery and profundofemoral artery are patent throughout their course without significant stenosis.  The superficial femoral artery is occluded at its origin.  There is reconstitution just beyond the adductor canal of a moderately diseased popliteal artery.  There is small caliber below-knee popliteal artery with two-vessel runoff via the peroneal and anterior tibial artery with stenosis at the origin of the tibioperoneal trunk.  Intervention: After the above images were acquired the decision was made to proceed with intervention.  A 6 French 45 cm sheath was advanced into the left common femoral artery and the patient was fully heparinized.  I began by using an 035 Glidewire with a quick cross catheter.  Subintimal recanalization was performed with reentry in the above-knee popliteal artery which was confirmed with a contrast injection.  I then was able to advance a versa core down into the peroneal artery.  The subintimal tract was then dilated with a 4 mm balloon and then I placed overlapping 6 mm Elluvia stents up to the origin of the superficial femoral artery.  These were postdilated with a 5 mm balloon.  Completion imaging showed widely patent superficial femoral artery at this point with widely patent stents.  Next, I switched out to a V-18 wire.  I selected a 2.5 x 200 balloon and perform balloon angioplasty of the peroneal, below-knee popliteal, tibioperoneal trunk, and above-knee popliteal artery at nominal pressure for 2 minutes.  Completion imaging showed significant improvement however there was persistent disease in the above-knee popliteal artery so I treated this with a 4 x 80 drug-coated Ranger balloon.  I also dilated the tibioperoneal trunk and peroneal artery with a 2.5 mm balloon.  Completion imaging  was then performed which showed inline flow down to the ankle with a intact pedal arch.  Is very satisfied with these results.  I could not close the groin because of the calcific disease in the right groin.  Patient be taken the holding area for sheath pull  Impression:  #1  Occlusion of left superficial femoral artery which was able to be recanalized and stented using 6 mm Elluvia stents.  #2  Moderate approximately 50% stenosis within the above-knee popliteal artery treated using a 4 mm drug-coated balloon with less than 20% stenosis  #3  Disease below the knee popliteal artery which was very small in caliber and relatively uniform.  This was dilated with a 2.5 mm balloon with no residual stenosis.  Also performed balloon angioplasty of the proximal tibioperoneal trunk and peroneal artery with a 2.5 mm balloon  #4  The patient now has inline flow to the ankle via the anterior tibial and peroneal artery.  She will return to the office in 1 week for wound check to determine the level of amputation    V. Annamarie Major, M.D., [facility] [place] Office: [phone] Pager:  [phone]

## 2021-12-19 NOTE — Discharge Instructions (Signed)
   [facility]  Discharge Instructions  Lower Extremity Angiogram; Angioplasty/Stenting  Please refer to the following instructions for your post-procedure care. Your surgeon or physician assistant will discuss any changes with you.  Activity  Avoid lifting more than 8 pounds (1 gallons of milk) for 72 hours (3 days) after your procedure. You may walk as much as you can tolerate. It's OK to drive after 72 hours.  Bathing/Showering  You may shower the day after your procedure. If you have a bandage, you may remove it at 24- 48 hours. Clean your incision site with mild soap and water. Pat the area dry with a clean towel.  Continue to clean left foot daily with soap and water and apply clean dressing daily.  Continue to protect left foot from injury.  Diet  Resume your pre-procedure diet. There are no special food restrictions following this procedure. All patients with peripheral vascular disease should follow a low fat/low cholesterol diet. In order to heal from your surgery, it is CRITICAL to get adequate nutrition. Your body requires vitamins, minerals, and protein. Vegetables are the best source of vitamins and minerals. Vegetables also provide the perfect balance of protein. Processed food has little nutritional value, so try to avoid this.  Medications  Resume taking all of your medications unless your doctor tells you not to. If your incision is causing pain, you may take over-the-counter pain relievers such as acetaminophen (Tylenol)  Restart Eliquis with this evening's (9/13) dose.   Follow Up  Follow up will be arranged at the time of your procedure. You may have an office visit scheduled or may be scheduled for surgery. Ask your surgeon if you have any questions.  Please call us immediately for any of the following conditions: Severe or worsening pain your legs or feet at rest or with walking. Increased pain, redness, drainage at your groin  puncture site. Fever of 101 degrees or higher. If you have any mild or slow bleeding from your puncture site: lie down, apply firm constant pressure over the area with a piece of gauze or a clean wash cloth for 30 minutes- no peeking!, call 911 right away if you are still bleeding after 30 minutes, or if the bleeding is heavy and unmanageable.  Reduce your risk factors of vascular disease:  Stop smoking. If you would like help call [facility] at 1-800-QUIT-NOW 204-322-2787) or [place] at ([phone]. Manage your cholesterol Maintain a desired weight Control your diabetes Keep your blood pressure down  If you have any questions, please call the office at [phone]

## 2021-12-19 NOTE — Interval H&P Note (Signed)
History and Physical Interval Note:  12/19/2021 12:07 PM  [name]  has presented today for surgery, with the diagnosis of critical limb ischemia bilateral.  The various methods of treatment have been discussed with the patient and family. After consideration of risks, benefits and other options for treatment, the patient has consented to  Procedure(s): ABDOMINAL AORTOGRAM W/LOWER EXTREMITY (N/A) as a surgical intervention.  The patient's history has been reviewed, patient examined, no change in status, stable for surgery.  I have reviewed the patient's chart and labs.  Questions were answered to the patient's satisfaction.     Annamarie Major

## 2021-12-20 DIAGNOSIS — I739 Peripheral vascular disease, unspecified: Secondary | ICD-10-CM | POA: Diagnosis not present

## 2021-12-20 DIAGNOSIS — I70222 Atherosclerosis of native arteries of extremities with rest pain, left leg: Secondary | ICD-10-CM | POA: Diagnosis not present

## 2021-12-20 DIAGNOSIS — I998 Other disorder of circulatory system: Secondary | ICD-10-CM

## 2021-12-20 LAB — LIPID PANEL
Cholesterol: 109 mg/dL (ref 0–200)
HDL: 34 mg/dL — ABNORMAL LOW (ref >40)
LDL Cholesterol: 66 mg/dL (ref 0–99)
Total CHOL/HDL Ratio: 3.2 RATIO
Triglycerides: 46 mg/dL (ref <150)
VLDL: 9 mg/dL (ref 0–40)

## 2021-12-20 NOTE — Discharge Summary (Signed)
Discharge Summary    [name] 03-09-1941 81 y.o. female  [id number]  Admission Date: 12/19/2021  Discharge Date: 12/20/2021  Physician: Serafina Mitchell, MD  Admission Diagnosis: PAD (peripheral artery disease) ([place]) [I73.9]   HPI:   This is a 81 y.o. female who is here today for follow up for PAD.  Pt is pt of Dr. Trula Slade and has hx of carotid artery stenosis that is followed by [name].  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     The patient has a history of breast cancer, status post left mastectomy.  She is on Eliquis for atrial fibrillation.     Pt was last seen January 2023 by Dr. Trula Slade and at that time, she was not having any neurological symptoms.  Her routine surveillance is with [name] and she was to f/u with Dr. Trula Slade in one year.     The pt returns today for follow up and here with her husband of 78 years and he helps provide information.  They tell me that she had a small wound that started on the left great toe about 6-7 weeks ago.  This has continued to get worse over time.  She is also having rest pain in the left foot where she does not tolerate it up in the bed and had to hang her leg down to get some relief.  They state she had placed a solonapas pad on the top of the foot and it caused a sore that is also not healing.  She has discoloration of the left 2nd toe as well.  She does not have any pain in the right foot.  She states that due to her foot she is really unable to walk due to the pain in the left foot.  She does have hx of back problems as well.     She and her husband state that she has had her veins removed from both legs around 2015 at [place].   She does have some swelling in the right leg.  She has been started on lisinopril.     Her husband states that she had labs drawn at Dr. [name] office and gave [name] a copy.  Her labs from January revealed normal  kidney function.  She states she had labs drawn in July and her husband states they were not much different from January but I do not have a copy of these labs.     She has been very compliant with her Eliquis and they tell me she has not missed any doses.   She is on Eliquis for afib.    The pt is on a statin for cholesterol management.    The pt is not on an aspirin.    Other AC:  Plavix/Eliquis The pt is on CCB, diuretic, BB for hypertension.  The pt does not have diabetes. Tobacco hx:  former  Hospital Course:  The patient was admitted to the hospital and taken to the [facility] lab on 12/19/2021 and underwent:            1.  Ultrasound-guided access, right femoral artery            2.  Aortogram            3.  Left lower extremity runoff            4.  Stent, left superficial femoral artery  5.  Drug-coated balloon angioplasty, left popliteal artery            6.  Angioplasty, left peroneal artery            7.  Conscious sedation, 102 minutes     Findings as follows:            Aortogram: No significant renal artery stenosis was identified.  The infrarenal abdominal aorta is calcified but patent without significant stenosis.  Bilateral common and external iliac arteries are widely patent            Right Lower Extremity: Not evaluated             Left Lower Extremity: The left common femoral artery and profundofemoral artery are patent throughout their course without significant stenosis.  The superficial femoral artery is occluded at its origin.  There is reconstitution just beyond the adductor canal of a moderately diseased popliteal artery.  There is small caliber below-knee popliteal artery with two-vessel runoff via the peroneal and anterior tibial artery with stenosis at the origin of the tibioperoneal trunk  The pt tolerated the procedure well and was transported to the PACU in good condition.   The femoral sheath was removed late and pt was admitted for observation.   On  POD 1, she was doing well.  Her pain was still present but improved compared to prior to procedure.   She is discharged home with plans to f/u with Dr. Trula Slade on 9/18 for wound check and discuss further intervention.    Her eliquis will be resumed with pm dose on 9/13.     CBC    Component Value Date/Time   WBC 11.2 (H) 01/19/2016 0739   RBC 4.14 01/19/2016 0739   HGB 12.2 12/19/2021 1021   HCT 36.0 12/19/2021 1021   PLT 362 01/19/2016 0739   MCV 97.8 01/19/2016 0739   MCH 31.9 01/19/2016 0739   MCHC 32.6 01/19/2016 0739   RDW 14.1 01/19/2016 0739   LYMPHSABS 1.8 06/08/2015 0900   MONOABS 2.0 (H) 06/08/2015 0900   EOSABS 0.0 06/08/2015 0900   BASOSABS 0.0 06/08/2015 0900    BMET    Component Value Date/Time   NA 135 12/19/2021 1021   NA 136 (A) 08/11/2010 0000   K 4.7 12/19/2021 1021   CL 99 12/19/2021 1021   CO2 23 03/14/2015 1230   GLUCOSE 77 12/19/2021 1021   BUN 36 (H) 12/19/2021 1021   BUN 21 08/11/2010 0000   CREATININE 1.10 (H) 12/19/2021 1021   CREATININE 0.57 (L) 03/14/2015 1230   CALCIUM 8.5 (L) 03/14/2015 1230   GFRNONAA >60 02/20/2015 0418   GFRAA >60 02/20/2015 0418        Discharge Diagnosis:  PAD (peripheral artery disease) ([place]) [I73.9]  Secondary Diagnosis: Patient Active Problem List   Diagnosis Date Noted   PAD (peripheral artery disease) ([place]) 12/19/2021   Osteoporosis    Tibial plateau fracture, left approx. 05/10/17 06/27/2017   COPD, moderate ([place]) 07/12/2015   Multiple gastric ulcers    Coagulopathy (HCC)    Bleeding gastrointestinal    Acute GI bleeding    CAD in native artery    Supratherapeutic INR    Right lower lobe lung mass    Cavitary lesion of lung    Acute renal failure (HCC)    SIRS (systemic inflammatory response syndrome) (HCC)    Chronic back pain    GI bleed 02/14/2015   Carotid artery disease ([place]) 02/14/2015  Leukocytosis 02/14/2015   Lung nodules 09/29/2014   Smoking history 09/29/2014   Pneumonia with  cavity of lung 07/09/2014   Lung mass 07/09/2014   Thrombocytosis ([place]) 05/21/2014   Protein-calorie malnutrition, severe ([place]) 05/21/2014   Anemia    Generalized weakness    Necrotic pneumonia ([place]) 05/20/2014   Pneumonia 05/20/2014   Elevated liver enzymes 12/16/2013   Tremor 06/24/2013   Encounter for therapeutic drug monitoring 05/18/2013   Dizziness 01/12/2013   Wound of ankle 10/23/2010   Long term current use of anticoagulant 07/05/2010   Carotid bruit 11/01/2009   Hyperlipemia 10/26/2009   Essential hypertension 10/26/2009   Atrial fibrillation ([place]) 10/26/2009   ACUTE ON CHRONIC DIASTOLIC HEART FAILURE 76/73/4193   SYNCOPE AND COLLAPSE 10/26/2009   Past Medical History:  Diagnosis Date   Anemia 05/2014.   Atrial fibrillation ([place])    x 3 yrs   Breast cancer ([place])    S/P left mastectomy and chemotherapy 1989 remained in remission   Carotid artery occlusion    Carotid bruit    LICA 79-02% (duplex 4/09)   Degenerative arthritis of spine 2015   Chronic back pain   Hyperlipidemia    Hypertension    Meningioma ([place])    Osteoporosis    Pneumonia May 19, 2014   Stroke [facility])    CVA 2008   Subclavian steal syndrome    Ulcers of both lower extremities ([place]) 2015     Allergies as of 12/20/2021       Reactions   Iodine Rash, Other (See Comments)   BETADINE Rash/burning, blisters on skin. Burns skin   Iohexol Rash, Other (See Comments)   Blisters; PT NEEDS 13-HOUR PREP   Povidone-iodine Other (See Comments)   Burns skin   Tape Itching, Rash, Other (See Comments)   "DO NOT USE ADHESIVE TAPE" Not even a Band Aid" NO PAPER TAPE Burns skin Skin is very very thin        Medication List     TAKE these medications    amLODipine 5 MG tablet Commonly known as: NORVASC TAKE 1 TABLET DAILY   ascorbic acid 500 MG tablet Commonly known as: VITAMIN C Take 500 mg by mouth every morning.   CALCIUM PO Take 1,200 mg by mouth daily.   clopidogrel 75 MG  tablet Commonly known as: PLAVIX TAKE 1 TABLET EVERY MORNING   denosumab 60 MG/ML Sosy injection Commonly known as: PROLIA Inject 60 mg into the skin every 6 (six) months.   Eliquis 2.5 MG Tabs tablet Generic drug: apixaban TAKE 1 TABLET TWICE A DAY   furosemide 20 MG tablet Commonly known as: LASIX Take 20 mg by mouth daily. Only as needed PRN   HYDROcodone-acetaminophen 5-325 MG tablet Commonly known as: NORCO/VICODIN hydrocodone 5 mg-acetaminophen 325 mg tablet   lisinopril 5 MG tablet Commonly known as: ZESTRIL Take 5 mg by mouth daily.   MAGNESIUM OXIDE PO Take 500 mg by mouth every morning.   metoprolol tartrate 25 MG tablet Commonly known as: LOPRESSOR TAKE 3 TABLETS TWICE A DAY   multivitamin with minerals Tabs tablet Take 1 tablet by mouth every morning.   pantoprazole 40 MG tablet Commonly known as: PROTONIX TAKE 1 TABLET DAILY   potassium chloride 10 MEQ tablet Commonly known as: KLOR-CON M Take 10 mEq by mouth daily.   pravastatin 40 MG tablet Commonly known as: PRAVACHOL Take 1 tablet (40 mg total) by mouth daily.   Spiriva Respimat 2.5 MCG/ACT Aers Generic drug: Tiotropium Bromide  Monohydrate USE 2 INHALATIONS BY MOUTH  DAILY   Spiriva Respimat 1.25 MCG/ACT Aers Generic drug: Tiotropium Bromide Monohydrate Inhale 2 puffs into the lungs daily.   sulfamethoxazole-trimethoprim 800-160 MG tablet Commonly known as: BACTRIM DS Take 1 tablet by mouth 2 (two) times daily.   traMADol 50 MG tablet Commonly known as: ULTRAM Take 50 mg by mouth every 6 (six) hours.          Instructions:  [facility]  Discharge Instructions  Lower Extremity Angiogram; Angioplasty/Stenting  Please refer to the following instructions for your post-procedure care. Your surgeon or physician assistant will discuss any changes with you.  Activity  Avoid lifting more than 8 pounds (1 gallons of milk) for 72 hours (3 days) after  your procedure. You may walk as much as you can tolerate. It's OK to drive after 72 hours.  Bathing/Showering  You may shower the day after your procedure. If you have a bandage, you may remove it at 24- 48 hours. Clean your incision site with mild soap and water. Pat the area dry with a clean towel.  Continue to clean left foot daily with soap and water and apply clean dressing daily.  Continue to protect left foot from injury.  Diet  Resume your pre-procedure diet. There are no special food restrictions following this procedure. All patients with peripheral vascular disease should follow a low fat/low cholesterol diet. In order to heal from your surgery, it is CRITICAL to get adequate nutrition. Your body requires vitamins, minerals, and protein. Vegetables are the best source of vitamins and minerals. Vegetables also provide the perfect balance of protein. Processed food has little nutritional value, so try to avoid this.  Medications  Resume taking all of your medications unless your doctor tells you not to. If your incision is causing pain, you may take over-the-counter pain relievers such as acetaminophen (Tylenol)  Restart Eliquis with this evening's (9/13) dose.   Follow Up  Follow up will be arranged at the time of your procedure. You may have an office visit scheduled or may be scheduled for surgery. Ask your surgeon if you have any questions.  Please call us immediately for any of the following conditions: Severe or worsening pain your legs or feet at rest or with walking. Increased pain, redness, drainage at your groin puncture site. Fever of 101 degrees or higher. If you have any mild or slow bleeding from your puncture site: lie down, apply firm constant pressure over the area with a piece of gauze or a clean wash cloth for 30 minutes- no peeking!, call 911 right away if you are still bleeding after 30 minutes, or if the bleeding is heavy and unmanageable.  Reduce your risk  factors of vascular disease:  Stop smoking. If you would like help call [facility] at 1-800-QUIT-NOW 720 330 6933) or [place] at [phone]. Manage your cholesterol Maintain a desired weight Control your diabetes Keep your blood pressure down  If you have any questions, please call the office at [phone]  Prescriptions given:  None given  Disposition: home  Patient's condition: is Good  Follow up: 1. Dr. Trula Slade on 12/25/2021 for wound check and discuss further intervention   Leontine Locket, PA-C [facility] [phone] 12/20/2021  8:42 AM

## 2021-12-20 NOTE — Progress Notes (Signed)
Pt has not voided much since beginning of shift only 150 ml. Bladder scan revealed 580 in bladder. In and out cath order after paging Dr. Donzetta Matters. Pt had 600 ml out and 0 residual. Pt tolerated well

## 2021-12-20 NOTE — Progress Notes (Deleted)
   12/19/21 1949  Assess: MEWS Score  Temp 98.4 F (36.9 C)  BP 116/78  MAP (mmHg) 91  Pulse Rate (!) 110  ECG Heart Rate (!) 111  Resp (!) 21  Level of Consciousness Alert  SpO2 96 %  O2 Device Room Air  Assess: MEWS Score  MEWS Temp 0  MEWS Systolic 0  MEWS Pulse 2  MEWS RR 1  MEWS LOC 0  MEWS Score 3  MEWS Score Color Yellow  Assess: if the MEWS score is Yellow or Red  Were vital signs taken at a resting state? Yes  Focused Assessment No change from prior assessment  Does the patient meet 2 or more of the SIRS criteria? Yes  Does the patient have a confirmed or suspected source of infection? No  MEWS guidelines implemented *See Row Information* Yes  Treat  Pain Scale 0-10  Pain Score 0  Pain Location Groin  Pain Orientation Right  Assess: SIRS CRITERIA  SIRS Temperature  0  SIRS Pulse 1  SIRS Respirations  1  SIRS WBC 1  SIRS Score Sum  3

## 2021-12-20 NOTE — Progress Notes (Addendum)
  Progress Note    12/20/2021 8:04 AM 1 Day Post-Op  Subjective:  says she still has pain in her left foot but it is better than before her procedure.  Says she will never go for the "white box" again.  (White box at wound care center with what sounds like debridement instruments).   Afebrile HR 60's-110's afib 10'X-323'F systolic 57% RA  Vitals:   12/20/21 0530 12/20/21 0700  BP: 133/72 (!) 119/55  Pulse: 91 65  Resp:    Temp:    SpO2: 96% 99%    Physical Exam: General:  no distress Lungs:  non labored Incisions:  right groin soft without hematoma Extremities:  left foot bandaged and dry Abdomen:  soft  CBC    Component Value Date/Time   WBC 11.2 (H) 01/19/2016 0739   RBC 4.14 01/19/2016 0739   HGB 12.2 12/19/2021 1021   HCT 36.0 12/19/2021 1021   PLT 362 01/19/2016 0739   MCV 97.8 01/19/2016 0739   MCH 31.9 01/19/2016 0739   MCHC 32.6 01/19/2016 0739   RDW 14.1 01/19/2016 0739   LYMPHSABS 1.8 06/08/2015 0900   MONOABS 2.0 (H) 06/08/2015 0900   EOSABS 0.0 06/08/2015 0900   BASOSABS 0.0 06/08/2015 0900    BMET    Component Value Date/Time   NA 135 12/19/2021 1021   NA 136 (A) 08/11/2010 0000   K 4.7 12/19/2021 1021   CL 99 12/19/2021 1021   CO2 23 03/14/2015 1230   GLUCOSE 77 12/19/2021 1021   BUN 36 (H) 12/19/2021 1021   BUN 21 08/11/2010 0000   CREATININE 1.10 (H) 12/19/2021 1021   CREATININE 0.57 (L) 03/14/2015 1230   CALCIUM 8.5 (L) 03/14/2015 1230   GFRNONAA >60 02/20/2015 0418   GFRAA >60 02/20/2015 0418    INR    Component Value Date/Time   INR 1.44 02/17/2015 0318   INR 3.2 06/14/2010 0840     Intake/Output Summary (Last 24 hours) at 12/20/2021 0804 Last data filed at 12/20/2021 0600 Gross per 24 hour  Intake 734.54 ml  Output 750 ml  Net -15.46 ml     Assessment/Plan:  81 y.o. female is s/p:             1.  Ultrasound-guided access, right femoral artery            2.  Aortogram            3.  Left lower extremity runoff             4.  Stent, left superficial femoral artery            5.  Drug-coated balloon angioplasty, left popliteal artery            6.  Angioplasty, left peroneal artery            7.  Conscious sedation, 102 minutes  1 Day Post-Op   -pt right groin soft without hematoma -left foot pain still present but improved since procedure -discharge today and f/u with Dr. Trula Slade on Monday to determine next steps and level of amputation.  Our office will arrange appt.  -DVT prophylaxis:  restart Eliquis today -continue Plavix   Leontine Locket, PA-C [facility] [phone] 12/20/2021 8:04 AM  I agree with the above.  Brisk left AT signal. Plan for PO abx and clinic follow up on Monday  [facility]

## 2021-12-20 NOTE — Progress Notes (Signed)
Mobility Specialist - Progress Note   12/20/21 1019  Mobility  Activity Stood at bedside  Level of Assistance +2 (takes two people)  Geographical information systems officer  Activity Response Tolerated fair  $Mobility charge 1 Mobility   Pt was received EOB and agreeable. Pt was +2 to stand from bed due to LLE pain. Pt states she uses a wheelchair at home and her husband transfers her from bed to chair. Pt was left EOB with all needs met and NT present.   Larey Seat

## 2021-12-20 NOTE — Progress Notes (Signed)
   12/19/21 1949  Assess: MEWS Score  Temp 98.4 F (36.9 C)  BP 116/78  MAP (mmHg) 91  Pulse Rate (!) 110  ECG Heart Rate (!) 111  Resp (!) 21  Level of Consciousness Alert  SpO2 96 %  O2 Device Room Air  Assess: MEWS Score  MEWS Temp 0  MEWS Systolic 0  MEWS Pulse 2  MEWS RR 1  MEWS LOC 0  MEWS Score 3  MEWS Score Color Yellow  Assess: if the MEWS score is Yellow or Red  Were vital signs taken at a resting state? Yes  Focused Assessment No change from prior assessment  Does the patient meet 2 or more of the SIRS criteria? Yes  Does the patient have a confirmed or suspected source of infection? No  MEWS guidelines implemented *See Row Information* Yes  Treat  Pain Scale 0-10  Pain Score 0  Pain Location Groin  Pain Orientation Right  Take Vital Signs  Increase Vital Sign Frequency  Yellow: Q 2hr X 2 then Q 4hr X 2, if remains yellow, continue Q 4hrs  Escalate  MEWS: Escalate Yellow: discuss with charge nurse/RN and consider discussing with provider and RRT  Notify: Charge Nurse/RN  Name of Charge Nurse/RN Notified Christina, RN  Date Charge Nurse/RN Notified 12/19/21  Time Charge Nurse/RN Notified 2200  Assess: SIRS CRITERIA  SIRS Temperature  0  SIRS Pulse 1  SIRS Respirations  1  SIRS WBC 1  SIRS Score Sum  3

## 2021-12-20 NOTE — Plan of Care (Signed)

## 2021-12-20 NOTE — Progress Notes (Signed)
Discharge instructions (including medications) discussed with and copy provided to patient/caregiver 

## 2021-12-20 NOTE — Plan of Care (Signed)
  Problem: Activity: Goal: Ability to return to baseline activity level will improve Outcome: Progressing   Problem: Cardiovascular: Goal: Ability to achieve and maintain adequate cardiovascular perfusion will improve Outcome: Progressing Goal: Vascular access site(s) Level 0-1 will be maintained Outcome: Progressing   Problem: Clinical Measurements: Goal: Cardiovascular complication will be avoided Outcome: Progressing   Problem: Activity: Goal: Risk for activity intolerance will decrease Outcome: Progressing   Problem: Coping: Goal: Level of anxiety will decrease Outcome: Progressing   Problem: Elimination: Goal: Will not experience complications related to urinary retention Outcome: Progressing   Problem: Pain Managment: Goal: General experience of comfort will improve Outcome: Progressing   Problem: Safety: Goal: Ability to remain free from injury will improve Outcome: Progressing   Problem: Skin Integrity: Goal: Risk for impaired skin integrity will decrease Outcome: Progressing

## 2021-12-24 ENCOUNTER — Inpatient Hospital Stay (HOSPITAL_COMMUNITY)
Admission: EM | Admit: 2021-12-24 | Discharge: 2022-01-02 | DRG: 853 | Disposition: A | Discharge status: Skilled Nursing Facility | Payer: Medicare HMO | Attending: Family Medicine | Admitting: Family Medicine

## 2021-12-24 ENCOUNTER — Other Ambulatory Visit: Payer: Self-pay

## 2021-12-24 ENCOUNTER — Encounter (HOSPITAL_COMMUNITY): Payer: Self-pay | Admitting: Emergency Medicine

## 2021-12-24 DIAGNOSIS — I4892 Unspecified atrial flutter: Secondary | ICD-10-CM | POA: Diagnosis present

## 2021-12-24 DIAGNOSIS — I110 Hypertensive heart disease with heart failure: Secondary | ICD-10-CM | POA: Diagnosis present

## 2021-12-24 DIAGNOSIS — M86172 Other acute osteomyelitis, left ankle and foot: Secondary | ICD-10-CM | POA: Diagnosis present

## 2021-12-24 DIAGNOSIS — Z83438 Family history of other disorder of lipoprotein metabolism and other lipidemia: Secondary | ICD-10-CM

## 2021-12-24 DIAGNOSIS — Z888 Allergy status to other drugs, medicaments and biological substances status: Secondary | ICD-10-CM

## 2021-12-24 DIAGNOSIS — Z91041 Radiographic dye allergy status: Secondary | ICD-10-CM

## 2021-12-24 DIAGNOSIS — N179 Acute kidney failure, unspecified: Secondary | ICD-10-CM | POA: Diagnosis present

## 2021-12-24 DIAGNOSIS — R6520 Severe sepsis without septic shock: Secondary | ICD-10-CM | POA: Diagnosis present

## 2021-12-24 DIAGNOSIS — D72829 Elevated white blood cell count, unspecified: Secondary | ICD-10-CM

## 2021-12-24 DIAGNOSIS — I480 Paroxysmal atrial fibrillation: Secondary | ICD-10-CM | POA: Diagnosis present

## 2021-12-24 DIAGNOSIS — Z79899 Other long term (current) drug therapy: Secondary | ICD-10-CM

## 2021-12-24 DIAGNOSIS — Z820 Family history of epilepsy and other diseases of the nervous system: Secondary | ICD-10-CM

## 2021-12-24 DIAGNOSIS — E861 Hypovolemia: Secondary | ICD-10-CM | POA: Diagnosis present

## 2021-12-24 DIAGNOSIS — M869 Osteomyelitis, unspecified: Secondary | ICD-10-CM

## 2021-12-24 DIAGNOSIS — Z86011 Personal history of benign neoplasm of the brain: Secondary | ICD-10-CM

## 2021-12-24 DIAGNOSIS — Z9841 Cataract extraction status, right eye: Secondary | ICD-10-CM

## 2021-12-24 DIAGNOSIS — N189 Chronic kidney disease, unspecified: Secondary | ICD-10-CM

## 2021-12-24 DIAGNOSIS — E785 Hyperlipidemia, unspecified: Secondary | ICD-10-CM | POA: Diagnosis present

## 2021-12-24 DIAGNOSIS — Z91048 Other nonmedicinal substance allergy status: Secondary | ICD-10-CM

## 2021-12-24 DIAGNOSIS — Z8673 Personal history of transient ischemic attack (TIA), and cerebral infarction without residual deficits: Secondary | ICD-10-CM

## 2021-12-24 DIAGNOSIS — I4819 Other persistent atrial fibrillation: Secondary | ICD-10-CM | POA: Diagnosis present

## 2021-12-24 DIAGNOSIS — Z9842 Cataract extraction status, left eye: Secondary | ICD-10-CM

## 2021-12-24 DIAGNOSIS — E440 Moderate protein-calorie malnutrition: Secondary | ICD-10-CM

## 2021-12-24 DIAGNOSIS — I484 Atypical atrial flutter: Secondary | ICD-10-CM

## 2021-12-24 DIAGNOSIS — Z9012 Acquired absence of left breast and nipple: Secondary | ICD-10-CM

## 2021-12-24 DIAGNOSIS — I70262 Atherosclerosis of native arteries of extremities with gangrene, left leg: Secondary | ICD-10-CM | POA: Diagnosis present

## 2021-12-24 DIAGNOSIS — K5900 Constipation, unspecified: Secondary | ICD-10-CM | POA: Diagnosis present

## 2021-12-24 DIAGNOSIS — A419 Sepsis, unspecified organism: Principal | ICD-10-CM

## 2021-12-24 DIAGNOSIS — L97529 Non-pressure chronic ulcer of other part of left foot with unspecified severity: Secondary | ICD-10-CM | POA: Diagnosis present

## 2021-12-24 DIAGNOSIS — Z7901 Long term (current) use of anticoagulants: Secondary | ICD-10-CM

## 2021-12-24 DIAGNOSIS — Z6820 Body mass index (BMI) 20.0-20.9, adult: Secondary | ICD-10-CM

## 2021-12-24 DIAGNOSIS — I10 Essential (primary) hypertension: Secondary | ICD-10-CM | POA: Diagnosis present

## 2021-12-24 DIAGNOSIS — I5033 Acute on chronic diastolic (congestive) heart failure: Secondary | ICD-10-CM | POA: Diagnosis present

## 2021-12-24 DIAGNOSIS — Z95820 Peripheral vascular angioplasty status with implants and grafts: Secondary | ICD-10-CM

## 2021-12-24 DIAGNOSIS — E875 Hyperkalemia: Secondary | ICD-10-CM

## 2021-12-24 DIAGNOSIS — R197 Diarrhea, unspecified: Secondary | ICD-10-CM

## 2021-12-24 DIAGNOSIS — E876 Hypokalemia: Secondary | ICD-10-CM | POA: Diagnosis not present

## 2021-12-24 DIAGNOSIS — J9811 Atelectasis: Secondary | ICD-10-CM | POA: Diagnosis present

## 2021-12-24 DIAGNOSIS — G8929 Other chronic pain: Secondary | ICD-10-CM | POA: Diagnosis present

## 2021-12-24 DIAGNOSIS — Z87891 Personal history of nicotine dependence: Secondary | ICD-10-CM

## 2021-12-24 DIAGNOSIS — I4891 Unspecified atrial fibrillation: Secondary | ICD-10-CM | POA: Diagnosis present

## 2021-12-24 DIAGNOSIS — Z7902 Long term (current) use of antithrombotics/antiplatelets: Secondary | ICD-10-CM

## 2021-12-24 DIAGNOSIS — Z8249 Family history of ischemic heart disease and other diseases of the circulatory system: Secondary | ICD-10-CM

## 2021-12-24 DIAGNOSIS — Z853 Personal history of malignant neoplasm of breast: Secondary | ICD-10-CM

## 2021-12-24 DIAGNOSIS — E860 Dehydration: Secondary | ICD-10-CM | POA: Diagnosis present

## 2021-12-24 MED ORDER — ONDANSETRON HCL 4 MG/2ML IJ SOLN
4.0000 mg | Freq: Once | INTRAMUSCULAR | Status: AC
  Administered 2021-12-25: 4 mg via INTRAVENOUS
  Filled 2021-12-24: qty 2

## 2021-12-24 MED ORDER — SODIUM CHLORIDE 0.9 % IV BOLUS
1000.0000 mL | Freq: Once | INTRAVENOUS | Status: AC
  Administered 2021-12-25: 1000 mL via INTRAVENOUS

## 2021-12-24 NOTE — ED Provider Notes (Signed)
[facility] EMERGENCY DEPARTMENT Provider Note   CSN: [id number] Arrival date & time: 12/24/21  2306     History {Add pertinent medical, surgical, social history, OB history to HPI:1} Chief Complaint  Patient presents with   Diarrhea    [name] is a 81 y.o. female.  Patient is an 81 year old female with past medical history of atrial fibrillation on on Eliquis, hypertension, congestive heart failure, peripheral vascular disease.  Patient presenting today for evaluation of diarrhea.  This started earlier this evening.  Patient reports approximately 8 episodes of watery stool in the past several hours.  She denies any bloody or black stools.  She denies any fevers or chills.  She denies to me she is having any abdominal pain, nausea, or vomiting.  She denies any ill contacts or having consumed any suspicious foods.  She is currently taking Bactrim for the foot sore.  The history is provided by the patient.       Home Medications Prior to Admission medications   Medication Sig Start Date End Date Taking? Authorizing Provider  amLODipine (NORVASC) 5 MG tablet TAKE 1 TABLET DAILY 05/11/21   Josue Hector, MD  CALCIUM PO Take 1,200 mg by mouth daily.    [provider]  clopidogrel (PLAVIX) 75 MG tablet TAKE 1 TABLET EVERY MORNING 08/31/21   Josue Hector, MD  denosumab (PROLIA) 60 MG/ML SOSY injection Inject 60 mg into the skin every 6 (six) months.    Celene Squibb, MD  ELIQUIS 2.5 MG TABS tablet TAKE 1 TABLET TWICE A DAY 11/23/21   Josue Hector, MD  furosemide (LASIX) 20 MG tablet Take 20 mg by mouth daily. Only as needed PRN    [provider]  HYDROcodone-acetaminophen (NORCO/VICODIN) 5-325 MG tablet hydrocodone 5 mg-acetaminophen 325 mg tablet Patient not taking: Reported on 12/18/2021    [provider]  lisinopril (ZESTRIL) 5 MG tablet Take 5 mg by mouth daily.    [provider]  MAGNESIUM OXIDE PO Take 500 mg by mouth every morning.     [provider]  metoprolol tartrate (LOPRESSOR) 25 MG tablet TAKE 3 TABLETS TWICE A DAY 10/06/21   Josue Hector, MD  Multiple Vitamin (MULTIVITAMIN WITH MINERALS) TABS tablet Take 1 tablet by mouth every morning.    [provider]  pantoprazole (PROTONIX) 40 MG tablet TAKE 1 TABLET DAILY 08/31/21   Josue Hector, MD  potassium chloride (KLOR-CON M) 10 MEQ tablet Take 10 mEq by mouth daily.    [provider]  pravastatin (PRAVACHOL) 40 MG tablet Take 1 tablet (40 mg total) by mouth daily. 07/20/21   Josue Hector, MD  sulfamethoxazole-trimethoprim (BACTRIM DS) 800-160 MG tablet Take 1 tablet by mouth 2 (two) times daily. 12/19/21   Serafina Mitchell, MD  Tiotropium Bromide Monohydrate (SPIRIVA RESPIMAT) 2.5 MCG/ACT AERS USE 2 INHALATIONS BY MOUTH  DAILY 10/25/21   Cobb, Karie Schwalbe, NP  traMADol (ULTRAM) 50 MG tablet Take 50 mg by mouth every 6 (six) hours.    [provider]  vitamin C (ASCORBIC ACID) 500 MG tablet Take 500 mg by mouth every morning.     [provider]      Allergies    Iodine, Iohexol, Povidone-iodine, and Tape    Review of Systems   Review of Systems  All other systems reviewed and are negative.   Physical Exam Updated Vital Signs BP (!) 147/97 (BP Location: Left Arm)   Pulse Marland Kitchen)  51   Temp (!) 96.6 F (35.9 C) (Axillary)   Resp (!) 100   Ht 5\' 2"  (1.575 m)   Wt 50.3 kg   BMI 20.30 kg/m  Physical Exam Vitals and nursing note reviewed.  Constitutional:      General: She is not in acute distress.    Appearance: She is well-developed. She is not diaphoretic.  HENT:     Head: Normocephalic and atraumatic.  Cardiovascular:     Rate and Rhythm: Normal rate and regular rhythm.     Heart sounds: No murmur heard.    No friction rub. No gallop.  Pulmonary:     Effort: Pulmonary effort is normal. No respiratory distress.     Breath sounds: Normal breath sounds. No wheezing.  Abdominal:     General: Bowel sounds are  normal. There is no distension.     Palpations: Abdomen is soft.     Tenderness: There is no abdominal tenderness.  Musculoskeletal:        General: Normal range of motion.     Cervical back: Normal range of motion and neck supple.  Skin:    General: Skin is warm and dry.  Neurological:     General: No focal deficit present.     Mental Status: She is alert and oriented to person, place, and time.     ED Results / Procedures / Treatments   Labs (all labs ordered are listed, but only abnormal results are displayed) Labs Reviewed  LIPASE, BLOOD  COMPREHENSIVE METABOLIC PANEL  CBC  URINALYSIS, ROUTINE W REFLEX MICROSCOPIC    EKG None  Radiology No results found.  Procedures Procedures  {Document cardiac monitor, telemetry assessment procedure when appropriate:1}  Medications Ordered in ED Medications  sodium chloride 0.9 % bolus 1,000 mL (has no administration in time range)  ondansetron (ZOFRAN) injection 4 mg (has no administration in time range)    ED Course/ Medical Decision Making/ A&P                           Medical Decision Making Amount and/or Complexity of Data Reviewed Labs: ordered.  Risk Prescription drug management.   ***  {Document critical care time when appropriate:1} {Document review of labs and clinical decision tools ie heart score, Chads2Vasc2 etc:1}  {Document your independent review of radiology images, and any outside records:1} {Document your discussion with family members, caretakers, and with consultants:1} {Document social determinants of health affecting pt's care:1} {Document your decision making why or why not admission, treatments were needed:1} Final Clinical Impression(s) / ED Diagnoses Final diagnoses:  None    Rx / DC Orders ED Discharge Orders     None

## 2021-12-24 NOTE — ED Triage Notes (Signed)
Pt with c/o diarrhea since 1pm.

## 2021-12-25 ENCOUNTER — Ambulatory Visit: Payer: Medicare HMO | Admitting: Cardiovascular Disease

## 2021-12-25 ENCOUNTER — Emergency Department (HOSPITAL_COMMUNITY): Payer: Medicare HMO

## 2021-12-25 ENCOUNTER — Encounter: Payer: Medicare HMO | Admitting: Surgery

## 2021-12-25 ENCOUNTER — Inpatient Hospital Stay (HOSPITAL_COMMUNITY): Payer: Medicare HMO

## 2021-12-25 ENCOUNTER — Encounter (HOSPITAL_COMMUNITY): Payer: Self-pay | Admitting: Family Medicine

## 2021-12-25 DIAGNOSIS — L97529 Non-pressure chronic ulcer of other part of left foot with unspecified severity: Secondary | ICD-10-CM | POA: Diagnosis present

## 2021-12-25 DIAGNOSIS — M86172 Other acute osteomyelitis, left ankle and foot: Secondary | ICD-10-CM | POA: Diagnosis present

## 2021-12-25 DIAGNOSIS — I70262 Atherosclerosis of native arteries of extremities with gangrene, left leg: Secondary | ICD-10-CM | POA: Diagnosis present

## 2021-12-25 DIAGNOSIS — I10 Essential (primary) hypertension: Secondary | ICD-10-CM

## 2021-12-25 DIAGNOSIS — Z8249 Family history of ischemic heart disease and other diseases of the circulatory system: Secondary | ICD-10-CM | POA: Diagnosis not present

## 2021-12-25 DIAGNOSIS — M79672 Pain in left foot: Secondary | ICD-10-CM | POA: Diagnosis not present

## 2021-12-25 DIAGNOSIS — I4819 Other persistent atrial fibrillation: Secondary | ICD-10-CM | POA: Diagnosis not present

## 2021-12-25 DIAGNOSIS — E785 Hyperlipidemia, unspecified: Secondary | ICD-10-CM | POA: Diagnosis present

## 2021-12-25 DIAGNOSIS — A419 Sepsis, unspecified organism: Secondary | ICD-10-CM

## 2021-12-25 DIAGNOSIS — R6520 Severe sepsis without septic shock: Secondary | ICD-10-CM

## 2021-12-25 DIAGNOSIS — M86672 Other chronic osteomyelitis, left ankle and foot: Secondary | ICD-10-CM | POA: Diagnosis not present

## 2021-12-25 DIAGNOSIS — E440 Moderate protein-calorie malnutrition: Secondary | ICD-10-CM

## 2021-12-25 DIAGNOSIS — E860 Dehydration: Secondary | ICD-10-CM | POA: Diagnosis present

## 2021-12-25 DIAGNOSIS — I70245 Atherosclerosis of native arteries of left leg with ulceration of other part of foot: Secondary | ICD-10-CM | POA: Diagnosis not present

## 2021-12-25 DIAGNOSIS — R197 Diarrhea, unspecified: Secondary | ICD-10-CM | POA: Diagnosis present

## 2021-12-25 DIAGNOSIS — N179 Acute kidney failure, unspecified: Secondary | ICD-10-CM | POA: Diagnosis present

## 2021-12-25 DIAGNOSIS — I4891 Unspecified atrial fibrillation: Secondary | ICD-10-CM | POA: Diagnosis not present

## 2021-12-25 DIAGNOSIS — J9811 Atelectasis: Secondary | ICD-10-CM | POA: Diagnosis present

## 2021-12-25 DIAGNOSIS — Z87891 Personal history of nicotine dependence: Secondary | ICD-10-CM | POA: Diagnosis not present

## 2021-12-25 DIAGNOSIS — M869 Osteomyelitis, unspecified: Secondary | ICD-10-CM

## 2021-12-25 DIAGNOSIS — E861 Hypovolemia: Secondary | ICD-10-CM | POA: Diagnosis present

## 2021-12-25 DIAGNOSIS — I110 Hypertensive heart disease with heart failure: Secondary | ICD-10-CM | POA: Diagnosis present

## 2021-12-25 DIAGNOSIS — I5033 Acute on chronic diastolic (congestive) heart failure: Secondary | ICD-10-CM | POA: Diagnosis present

## 2021-12-25 DIAGNOSIS — E875 Hyperkalemia: Secondary | ICD-10-CM

## 2021-12-25 DIAGNOSIS — I2510 Atherosclerotic heart disease of native coronary artery without angina pectoris: Secondary | ICD-10-CM | POA: Diagnosis not present

## 2021-12-25 DIAGNOSIS — Z7902 Long term (current) use of antithrombotics/antiplatelets: Secondary | ICD-10-CM | POA: Diagnosis not present

## 2021-12-25 DIAGNOSIS — Z79899 Other long term (current) drug therapy: Secondary | ICD-10-CM | POA: Diagnosis not present

## 2021-12-25 DIAGNOSIS — D72829 Elevated white blood cell count, unspecified: Secondary | ICD-10-CM

## 2021-12-25 DIAGNOSIS — K5900 Constipation, unspecified: Secondary | ICD-10-CM | POA: Diagnosis present

## 2021-12-25 DIAGNOSIS — I480 Paroxysmal atrial fibrillation: Secondary | ICD-10-CM | POA: Diagnosis present

## 2021-12-25 DIAGNOSIS — E876 Hypokalemia: Secondary | ICD-10-CM | POA: Diagnosis not present

## 2021-12-25 DIAGNOSIS — Z7901 Long term (current) use of anticoagulants: Secondary | ICD-10-CM | POA: Diagnosis not present

## 2021-12-25 DIAGNOSIS — Z853 Personal history of malignant neoplasm of breast: Secondary | ICD-10-CM | POA: Diagnosis not present

## 2021-12-25 DIAGNOSIS — I4892 Unspecified atrial flutter: Secondary | ICD-10-CM | POA: Diagnosis present

## 2021-12-25 LAB — COMPREHENSIVE METABOLIC PANEL
ALT: 28 U/L (ref 0–44)
ALT: 31 U/L (ref 0–44)
ALT: 40 U/L (ref 0–44)
AST: 37 U/L (ref 15–41)
AST: 39 U/L (ref 15–41)
AST: 49 U/L — ABNORMAL HIGH (ref 15–41)
Albumin: 2.5 g/dL — ABNORMAL LOW (ref 3.5–5.0)
Albumin: 2.5 g/dL — ABNORMAL LOW (ref 3.5–5.0)
Albumin: 3 g/dL — ABNORMAL LOW (ref 3.5–5.0)
Alkaline Phosphatase: 50 U/L (ref 38–126)
Alkaline Phosphatase: 51 U/L (ref 38–126)
Alkaline Phosphatase: 65 U/L (ref 38–126)
Anion gap: 11 (ref 5–15)
Anion gap: 14 (ref 5–15)
Anion gap: 9 (ref 5–15)
BUN: 42 mg/dL — ABNORMAL HIGH (ref 8–23)
BUN: 44 mg/dL — ABNORMAL HIGH (ref 8–23)
BUN: 44 mg/dL — ABNORMAL HIGH (ref 8–23)
CO2: 18 mmol/L — ABNORMAL LOW (ref 22–32)
CO2: 20 mmol/L — ABNORMAL LOW (ref 22–32)
CO2: 20 mmol/L — ABNORMAL LOW (ref 22–32)
Calcium: 7.8 mg/dL — ABNORMAL LOW (ref 8.9–10.3)
Calcium: 8.1 mg/dL — ABNORMAL LOW (ref 8.9–10.3)
Calcium: 9.1 mg/dL (ref 8.9–10.3)
Chloride: 100 mmol/L (ref 98–111)
Chloride: 103 mmol/L (ref 98–111)
Chloride: 107 mmol/L (ref 98–111)
Creatinine, Ser: 1.94 mg/dL — ABNORMAL HIGH (ref 0.44–1.00)
Creatinine, Ser: 1.97 mg/dL — ABNORMAL HIGH (ref 0.44–1.00)
Creatinine, Ser: 2.14 mg/dL — ABNORMAL HIGH (ref 0.44–1.00)
GFR, Estimated: 22 mL/min — ABNORMAL LOW (ref >60)
GFR, Estimated: 25 mL/min — ABNORMAL LOW (ref >60)
GFR, Estimated: 25 mL/min — ABNORMAL LOW (ref >60)
Glucose, Bld: 127 mg/dL — ABNORMAL HIGH (ref 70–99)
Glucose, Bld: 159 mg/dL — ABNORMAL HIGH (ref 70–99)
Glucose, Bld: 80 mg/dL (ref 70–99)
Potassium: 4.8 mmol/L (ref 3.5–5.1)
Potassium: 4.9 mmol/L (ref 3.5–5.1)
Potassium: 5.7 mmol/L — ABNORMAL HIGH (ref 3.5–5.1)
Sodium: 134 mmol/L — ABNORMAL LOW (ref 135–145)
Sodium: 134 mmol/L — ABNORMAL LOW (ref 135–145)
Sodium: 134 mmol/L — ABNORMAL LOW (ref 135–145)
Total Bilirubin: 0.6 mg/dL (ref 0.3–1.2)
Total Bilirubin: 0.6 mg/dL (ref 0.3–1.2)
Total Bilirubin: 0.8 mg/dL (ref 0.3–1.2)
Total Protein: 5.8 g/dL — ABNORMAL LOW (ref 6.5–8.1)
Total Protein: 5.9 g/dL — ABNORMAL LOW (ref 6.5–8.1)
Total Protein: 7.3 g/dL (ref 6.5–8.1)

## 2021-12-25 LAB — CBC
HCT: 31.8 % — ABNORMAL LOW (ref 36.0–46.0)
HCT: 36.7 % (ref 36.0–46.0)
Hemoglobin: 10.4 g/dL — ABNORMAL LOW (ref 12.0–15.0)
Hemoglobin: 12 g/dL (ref 12.0–15.0)
MCH: 31.8 pg (ref 26.0–34.0)
MCH: 32.4 pg (ref 26.0–34.0)
MCHC: 32.7 g/dL (ref 30.0–36.0)
MCHC: 32.7 g/dL (ref 30.0–36.0)
MCV: 97.3 fL (ref 80.0–100.0)
MCV: 99.1 fL (ref 80.0–100.0)
Platelets: 377 10*3/uL (ref 150–400)
Platelets: 461 10*3/uL — ABNORMAL HIGH (ref 150–400)
RBC: 3.21 MIL/uL — ABNORMAL LOW (ref 3.87–5.11)
RBC: 3.77 MIL/uL — ABNORMAL LOW (ref 3.87–5.11)
RDW: 13.7 % (ref 11.5–15.5)
RDW: 13.9 % (ref 11.5–15.5)
WBC: 33.1 10*3/uL — ABNORMAL HIGH (ref 4.0–10.5)
WBC: 38.2 10*3/uL — ABNORMAL HIGH (ref 4.0–10.5)
nRBC: 0 % (ref 0.0–0.2)
nRBC: 0.1 % (ref 0.0–0.2)

## 2021-12-25 LAB — CBC WITH DIFFERENTIAL/PLATELET
Abs Immature Granulocytes: 0.47 10*3/uL — ABNORMAL HIGH (ref 0.00–0.07)
Basophils Absolute: 0.1 10*3/uL (ref 0.0–0.1)
Basophils Relative: 0 %
Eosinophils Absolute: 0 10*3/uL (ref 0.0–0.5)
Eosinophils Relative: 0 %
HCT: 27.2 % — ABNORMAL LOW (ref 36.0–46.0)
Hemoglobin: 8.7 g/dL — ABNORMAL LOW (ref 12.0–15.0)
Immature Granulocytes: 2 %
Lymphocytes Relative: 7 %
Lymphs Abs: 2 10*3/uL (ref 0.7–4.0)
MCH: 31.5 pg (ref 26.0–34.0)
MCHC: 32 g/dL (ref 30.0–36.0)
MCV: 98.6 fL (ref 80.0–100.0)
Monocytes Absolute: 2 10*3/uL — ABNORMAL HIGH (ref 0.1–1.0)
Monocytes Relative: 7 %
Neutro Abs: 25.7 10*3/uL — ABNORMAL HIGH (ref 1.7–7.7)
Neutrophils Relative %: 84 %
Platelets: 342 10*3/uL (ref 150–400)
RBC: 2.76 MIL/uL — ABNORMAL LOW (ref 3.87–5.11)
RDW: 14.2 % (ref 11.5–15.5)
WBC: 30.2 10*3/uL — ABNORMAL HIGH (ref 4.0–10.5)
nRBC: 0 % (ref 0.0–0.2)

## 2021-12-25 LAB — LACTIC ACID, PLASMA
Lactic Acid, Venous: 2.7 mmol/L (ref 0.5–1.9)
Lactic Acid, Venous: 3.4 mmol/L (ref 0.5–1.9)
Lactic Acid, Venous: 3.2 mmol/L (ref 0.5–1.9)
Lactic Acid, Venous: 1.4 mmol/L (ref 0.5–1.9)

## 2021-12-25 LAB — MAGNESIUM: Magnesium: 2.3 mg/dL (ref 1.7–2.4)

## 2021-12-25 LAB — POTASSIUM: Potassium: 4.9 mmol/L (ref 3.5–5.1)

## 2021-12-25 LAB — CORTISOL-AM, BLOOD: Cortisol - AM: 26.2 ug/dL — ABNORMAL HIGH (ref 6.7–22.6)

## 2021-12-25 LAB — C-REACTIVE PROTEIN: CRP: 7.1 mg/dL — ABNORMAL HIGH (ref <1.0)

## 2021-12-25 LAB — PROTIME-INR
INR: 1.4 — ABNORMAL HIGH (ref 0.8–1.2)
Prothrombin Time: 17.3 seconds — ABNORMAL HIGH (ref 11.4–15.2)

## 2021-12-25 LAB — PROCALCITONIN: Procalcitonin: 0.72 ng/mL

## 2021-12-25 LAB — LIPASE, BLOOD: Lipase: 36 U/L (ref 11–51)

## 2021-12-25 LAB — SEDIMENTATION RATE: Sed Rate: 71 mm/hr — ABNORMAL HIGH (ref 0–22)

## 2021-12-25 MED ORDER — CLOPIDOGREL BISULFATE 75 MG PO TABS
75.0000 mg | ORAL_TABLET | Freq: Every morning | ORAL | Status: DC
  Filled 2021-12-25: qty 1

## 2021-12-25 MED ORDER — VANCOMYCIN HCL 750 MG/150ML IV SOLN
750.0000 mg | INTRAVENOUS | Status: DC
  Administered 2021-12-26 – 2021-12-27 (×2): 750 mg via INTRAVENOUS
  Filled 2021-12-25 (×3): qty 150

## 2021-12-25 MED ORDER — ONDANSETRON HCL 4 MG PO TABS: 4.0000 mg | ORAL_TABLET | Freq: Four times a day (QID) | ORAL | Status: DC | PRN

## 2021-12-25 MED ORDER — SODIUM CHLORIDE 0.9 % IV SOLN
2.0000 g | INTRAVENOUS | Status: DC
  Administered 2021-12-26 – 2021-12-28 (×3): 2 g via INTRAVENOUS
  Filled 2021-12-25 (×3): qty 12.5

## 2021-12-25 MED ORDER — APIXABAN 2.5 MG PO TABS
2.5000 mg | ORAL_TABLET | Freq: Two times a day (BID) | ORAL | Status: DC
  Filled 2021-12-25: qty 1

## 2021-12-25 MED ORDER — SODIUM CHLORIDE 0.9 % IV BOLUS
500.0000 mL | Freq: Once | INTRAVENOUS | Status: AC
  Administered 2021-12-25: 500 mL via INTRAVENOUS

## 2021-12-25 MED ORDER — VANCOMYCIN VARIABLE DOSE PER UNSTABLE RENAL FUNCTION (PHARMACIST DOSING): Status: DC

## 2021-12-25 MED ORDER — ONDANSETRON HCL 4 MG/2ML IJ SOLN
4.0000 mg | Freq: Four times a day (QID) | INTRAMUSCULAR | Status: DC | PRN
  Administered 2021-12-28: 4 mg via INTRAVENOUS
  Filled 2021-12-25: qty 2

## 2021-12-25 MED ORDER — VANCOMYCIN HCL IN DEXTROSE 1-5 GM/200ML-% IV SOLN
1000.0000 mg | Freq: Once | INTRAVENOUS | Status: AC
  Administered 2021-12-25: 1000 mg via INTRAVENOUS
  Filled 2021-12-25: qty 200

## 2021-12-25 MED ORDER — ACETAMINOPHEN 325 MG PO TABS
650.0000 mg | ORAL_TABLET | Freq: Four times a day (QID) | ORAL | Status: DC | PRN
  Administered 2021-12-25 – 2022-01-02 (×5): 650 mg via ORAL
  Filled 2021-12-25 (×7): qty 2

## 2021-12-25 MED ORDER — SODIUM CHLORIDE 0.9 % IV SOLN: INTRAVENOUS | Status: DC

## 2021-12-25 MED ORDER — PRAVASTATIN SODIUM 40 MG PO TABS
40.0000 mg | ORAL_TABLET | Freq: Every day | ORAL | Status: DC
  Administered 2021-12-25 – 2022-01-02 (×9): 40 mg via ORAL
  Filled 2021-12-25 (×9): qty 1

## 2021-12-25 MED ORDER — ALBUTEROL SULFATE (2.5 MG/3ML) 0.083% IN NEBU: 2.5000 mg | INHALATION_SOLUTION | RESPIRATORY_TRACT | Status: DC | PRN

## 2021-12-25 MED ORDER — ACETAMINOPHEN 650 MG RE SUPP: 650.0000 mg | Freq: Four times a day (QID) | RECTAL | Status: DC | PRN

## 2021-12-25 MED ORDER — SODIUM CHLORIDE 0.9 % IV SOLN
2.0000 g | Freq: Once | INTRAVENOUS | Status: AC
  Administered 2021-12-25: 2 g via INTRAVENOUS
  Filled 2021-12-25: qty 12.5

## 2021-12-25 MED ORDER — METRONIDAZOLE 500 MG/100ML IV SOLN
500.0000 mg | Freq: Two times a day (BID) | INTRAVENOUS | Status: DC
  Administered 2021-12-25 – 2021-12-27 (×5): 500 mg via INTRAVENOUS
  Filled 2021-12-25 (×5): qty 100

## 2021-12-25 MED ORDER — OXYCODONE HCL 5 MG PO TABS
5.0000 mg | ORAL_TABLET | ORAL | Status: DC | PRN
  Administered 2021-12-25 – 2022-01-02 (×24): 5 mg via ORAL
  Filled 2021-12-25 (×25): qty 1

## 2021-12-25 NOTE — Assessment & Plan Note (Signed)
-   Holding Norvasc, metoprolol, lisinopril, Lasix in the setting of AKI and hypotension

## 2021-12-25 NOTE — ED Notes (Signed)
Patient's foot unwrapped per hospitalist's and wrapped back at this time. Black and yellow eschar noted to left great toe, anterior foot and medial ankle. Patient denies pain upon palpation.

## 2021-12-25 NOTE — ED Notes (Signed)
Patient had small bowel movement. Patient cleaned and provided with new gown, brief, linen and pad. Patient repositioned in bed and given water. Patient voices no complaints at this time.

## 2021-12-25 NOTE — ED Notes (Signed)
Patient ate 100% of lunch

## 2021-12-25 NOTE — Assessment & Plan Note (Signed)
-   Creatinine increased from 1.10>> 2.14 -Bicarb 20 - Prerenal with diarrhea and hypovolemia - 1 L NS in the ED - Continue 150 MLS per hour maintenance fluid - Trend in the a.m. - Hold nephrotoxic agents including lisinopril and Protonix

## 2021-12-25 NOTE — Assessment & Plan Note (Signed)
-  Possible osteomyelitis on left foot x-ray - Pictures in chart from September 8 - Wound and dry gangrene have gotten significantly worse since those pictures - Patient already following outpatient with vascular and recently had stents for peripheral vascular disease - Check ESR and CRP - Continue to monitor

## 2021-12-25 NOTE — Assessment & Plan Note (Signed)
-   Continue Eliquis -Holding metoprolol in the setting of hypotension down to 75/42

## 2021-12-25 NOTE — Assessment & Plan Note (Signed)
-   Leukocytosis 38, hypotension down to 75/42, with AKI - Sources to consider include possible osteomyelitis and intra-abdominal infection given the diarrhea - Continue broad-spectrum antibiotics with cefepime, Flagyl, vancomycin - Stool culture and C. difficile PCR pending - Blood cultures pending - Lactic acid uptrending from 2.7, 3.4, continue 150 mL/h IV fluids - Continue to monitor

## 2021-12-25 NOTE — Progress Notes (Addendum)
12/25/2021 9:18 AM  I unwrapped left foot with RN and photos below.  Will attempt to contact vascular surgeon today and likely will need transfer to [facility].  I reached out to Dr. Trula Slade and his recommendation was for patient to come to [facility]. Changing admit order to [facility].  Will reach out to vascular team and [facility] to make them aware of her coming.            ASSUMPTION OF CARE NOTE   12/25/2021 1:26 PM  Kennon Holter was seen and examined.  The H&P by the admitting provider, orders, imaging was reviewed.  Please see new orders.  Pt will transfer to [facility]  Notified vascular surgery and [facility] of transfer to [facility].  I reached out to Dr. Trula Slade and he recommended transfer to [facility].  I reached out to [facility] office and notified provider on call that patient was transferring to [facility] and requested consultation.  I reached out to [facility] to notify of transfer to [facility].     Vitals:   12/25/21 0952 12/25/21 1230  BP:  (!) 94/53  Pulse:  (!) 110  Resp:  19  Temp: 98.1 F (36.7 C)   SpO2:  100%    Results for orders placed or performed during the hospital encounter of 12/24/21  Culture, blood (routine x 2)   Specimen: BLOOD LEFT HAND  Result Value Ref Range   Specimen Description BLOOD LEFT HAND BOTTLES DRAWN AEROBIC ONLY    Special Requests      Blood Culture results may not be optimal due to an inadequate volume of blood received in culture bottles Performed at [facility], [address] [place], [place] [address]    Culture PENDING    Report Status PENDING   Culture, blood (routine x 2)   Specimen: Left Antecubital; Blood  Result Value Ref Range   Specimen Description      LEFT ANTECUBITAL BOTTLES DRAWN AEROBIC AND ANAEROBIC   Special Requests      Blood Culture adequate volume Performed at [facility], [address] [place], [place] [address]    Culture PENDING    Report Status PENDING   Lipase, blood  Result Value Ref Range   Lipase 36 11 - 51 U/L  Comprehensive metabolic panel  Result Value Ref  Range   Sodium 134 (L) 135 - 145 mmol/L   Potassium 5.7 (H) 3.5 - 5.1 mmol/L   Chloride 100 98 - 111 mmol/L   CO2 20 (L) 22 - 32 mmol/L   Glucose, Bld 159 (H) 70 - 99 mg/dL   BUN 44 (H) 8 - 23 mg/dL   Creatinine, Ser 2.14 (H) 0.44 - 1.00 mg/dL   Calcium 9.1 8.9 - 10.3 mg/dL   Total Protein 7.3 6.5 - 8.1 g/dL   Albumin 3.0 (L) 3.5 - 5.0 g/dL   AST 49 (H) 15 - 41 U/L   ALT 40 0 - 44 U/L   Alkaline Phosphatase 65 38 - 126 U/L   Total Bilirubin 0.6 0.3 - 1.2 mg/dL   GFR, Estimated 22 (L) >60 mL/min   Anion gap 14 5 - 15  CBC  Result Value Ref Range   WBC 38.2 (H) 4.0 - 10.5 K/uL   RBC 3.77 (L) 3.87 - 5.11 MIL/uL   Hemoglobin 12.0 12.0 - 15.0 g/dL   HCT 36.7 36.0 - 46.0 %   MCV 97.3 80.0 - 100.0 fL   MCH 31.8 26.0 - 34.0 pg   MCHC 32.7 30.0 -  36.0 g/dL   RDW 13.7 11.5 - 15.5 %   Platelets 461 (H) 150 - 400 K/uL   nRBC 0.1 0.0 - 0.2 %  Potassium  Result Value Ref Range   Potassium 4.9 3.5 - 5.1 mmol/L  Lactic acid, plasma  Result Value Ref Range   Lactic Acid, Venous 2.7 (HH) 0.5 - 1.9 mmol/L  Lactic acid, plasma  Result Value Ref Range   Lactic Acid, Venous 3.4 (HH) 0.5 - 1.9 mmol/L  C-reactive protein  Result Value Ref Range   CRP 7.1 (H) <1.0 mg/dL  Sedimentation rate  Result Value Ref Range   Sed Rate 71 (H) 0 - 22 mm/hr  Procalcitonin - Baseline  Result Value Ref Range   Procalcitonin 0.72 ng/mL  Lactic acid, plasma  Result Value Ref Range   Lactic Acid, Venous 3.2 (HH) 0.5 - 1.9 mmol/L  Comprehensive metabolic panel  Result Value Ref Range   Sodium 134 (L) 135 - 145 mmol/L   Potassium 4.9 3.5 - 5.1 mmol/L   Chloride 103 98 - 111 mmol/L   CO2 20 (L) 22 - 32 mmol/L   Glucose, Bld 127 (H) 70 - 99 mg/dL   BUN 44 (H) 8 - 23 mg/dL   Creatinine, Ser 1.97 (H) 0.44 - 1.00 mg/dL   Calcium 8.1 (L) 8.9 - 10.3 mg/dL   Total Protein 5.9 (L) 6.5 - 8.1 g/dL   Albumin 2.5 (L) 3.5 - 5.0 g/dL   AST 39 15 - 41 U/L   ALT 31 0 - 44 U/L   Alkaline Phosphatase 51 38 - 126 U/L    Total Bilirubin 0.8 0.3 - 1.2 mg/dL   GFR, Estimated 25 (L) >60 mL/min   Anion gap 11 5 - 15  CBC  Result Value Ref Range   WBC 33.1 (H) 4.0 - 10.5 K/uL   RBC 3.21 (L) 3.87 - 5.11 MIL/uL   Hemoglobin 10.4 (L) 12.0 - 15.0 g/dL   HCT 31.8 (L) 36.0 - 46.0 %   MCV 99.1 80.0 - 100.0 fL   MCH 32.4 26.0 - 34.0 pg   MCHC 32.7 30.0 - 36.0 g/dL   RDW 13.9 11.5 - 15.5 %   Platelets 377 150 - 400 K/uL   nRBC 0.0 0.0 - 0.2 %  Protime-INR  Result Value Ref Range   Prothrombin Time 17.3 (H) 11.4 - 15.2 seconds   INR 1.4 (H) 0.8 - 1.2  Comprehensive metabolic panel  Result Value Ref Range   Sodium 134 (L) 135 - 145 mmol/L   Potassium 4.8 3.5 - 5.1 mmol/L   Chloride 107 98 - 111 mmol/L   CO2 18 (L) 22 - 32 mmol/L   Glucose, Bld 80 70 - 99 mg/dL   BUN 42 (H) 8 - 23 mg/dL   Creatinine, Ser 1.94 (H) 0.44 - 1.00 mg/dL   Calcium 7.8 (L) 8.9 - 10.3 mg/dL   Total Protein 5.8 (L) 6.5 - 8.1 g/dL   Albumin 2.5 (L) 3.5 - 5.0 g/dL   AST 37 15 - 41 U/L   ALT 28 0 - 44 U/L   Alkaline Phosphatase 50 38 - 126 U/L   Total Bilirubin 0.6 0.3 - 1.2 mg/dL   GFR, Estimated 25 (L) >60 mL/min   Anion gap 9 5 - 15  Magnesium  Result Value Ref Range   Magnesium 2.3 1.7 - 2.4 mg/dL  CBC with Differential/Platelet  Result Value Ref Range   WBC 30.2 (H) 4.0 - 10.5 K/uL  RBC 2.76 (L) 3.87 - 5.11 MIL/uL   Hemoglobin 8.7 (L) 12.0 - 15.0 g/dL   HCT 27.2 (L) 36.0 - 46.0 %   MCV 98.6 80.0 - 100.0 fL   MCH 31.5 26.0 - 34.0 pg   MCHC 32.0 30.0 - 36.0 g/dL   RDW 14.2 11.5 - 15.5 %   Platelets 342 150 - 400 K/uL   nRBC 0.0 0.0 - 0.2 %   Neutrophils Relative % 84 %   Neutro Abs 25.7 (H) 1.7 - 7.7 K/uL   Lymphocytes Relative 7 %   Lymphs Abs 2.0 0.7 - 4.0 K/uL   Monocytes Relative 7 %   Monocytes Absolute 2.0 (H) 0.1 - 1.0 K/uL   Eosinophils Relative 0 %   Eosinophils Absolute 0.0 0.0 - 0.5 K/uL   Basophils Relative 0 %   Basophils Absolute 0.1 0.0 - 0.1 K/uL   Immature Granulocytes 2 %   Abs Immature  Granulocytes 0.47 (H) 0.00 - 0.07 K/uL  Lactic acid, plasma  Result Value Ref Range   Lactic Acid, Venous 1.4 0.5 - 1.9 mmol/L   C. Wynetta Emery, MD [facility]   12/24/2021 11:27 PM How to contact the [facility] Attending or Consulting provider [place] or covering provider during after hours [place], for this patient?  Check the care team in [facility] and look for a) attending/consulting [facility] provider listed and b) the [facility] team listed Log into [email or web address] and use [place] universal password to access. If you do not have the password, please contact the hospital operator. Locate the [facility] provider you are looking for under [facility] and page to a number that you can be directly reached. If you still have difficulty reaching the provider, please page the [facility] (Director on Call) for the Hospitalists listed on amion for assistance.

## 2021-12-25 NOTE — Assessment & Plan Note (Signed)
-   Albumin 3.0 - Encourage nutrient dense p.o. intake - Continue to monitor

## 2021-12-25 NOTE — Assessment & Plan Note (Signed)
-   After taking 2 courses of antibiotics - Concern for C. difficile - Next time Patient has a bowel movement sent for culture - Continue to monitor

## 2021-12-25 NOTE — H&P (Signed)
History and Physical    Patient: [name] NKN:[id number] DOB: 10/08/1940 DOA: 12/24/2021 DOS: the patient was seen and examined on 12/25/2021 PCP: Celene Squibb, MD  Patient coming from: Home  Chief Complaint:  Chief Complaint  Patient presents with   Diarrhea   HPI: [name] is a 81 y.o. female with medical history significant of atrial fibrillation, anemia, hyperlipidemia, hypertension, stroke, peripheral vascular disease, and more presents to the ED with a chief complaint of diarrhea.  Patient reports the diarrhea started at 1 PM on the day of presentation.  He had progressively worse with more frequency throughout the day.  By 6 PM patient was having a bowel movement every 10 minutes.  She describes it as watery.  There was no black, coffee-ground, red color to it.  Patient denies any abdominal pain.  She reports no fever but does report chills.  She has had no nausea and vomiting.  She reports her last meal was 2-3 weeks ago.  She was on to explain that she had had a foot wound that was so painful it was reducing her appetite.  She followed with vascular and on Tuesday they put stents in her left lower extremity which improved the pain in her left foot tremendously.  Since then she has been eating a little more, but still not back to her normal appetite.  She is not sure how much weight she is lost.  Prior to vascular taking her to the OR, they started her on Bactrim.  She thinks she is taking 2 courses of Bactrim the first 1 in mid August for 10 days and the second 1 started 9 days ago.  Patient has never had C. difficile before.  As for the wound it started off about the size of a nickel on his big toe.  It was painful.  They are cleaning it with wound wash.  Because of the pain and applying Salonpas on it and when they pulled that off it ripped the skin off.  This made the surrounding skin slough off and now she has a large area on the dorsal aspect of her foot that has been stripped of  skin.  They do report there is some drainage.  It was like a blister on the dorsum of the foot and they poked it with a needle and expressed purulence and red fluid from it.  Overall the poor historians.  Wife turns to husband answer all questions.  Husband answers whenever he thinks you should know, not which he actually ask, and he gets very frustrated if you repeat the same question.  Patient does not smoke, does not drink alcohol, does not use illicit drugs.  She is vaccinated for COVID.  Patient is full code. Review of Systems: As mentioned in the history of present illness. All other systems reviewed and are negative. Past Medical History:  Diagnosis Date   Anemia 05/2014.   Atrial fibrillation ([place])    x 3 yrs   Breast cancer ([place])    S/P left mastectomy and chemotherapy 1989 remained in remission   Carotid artery occlusion    Carotid bruit    LICA 37-90% (duplex 2/40)   Degenerative arthritis of spine 2015   Chronic back pain   Hyperlipidemia    Hypertension    Meningioma ([place])    Osteoporosis    Pneumonia May 19, 2014   Stroke [facility])    CVA 2008   Subclavian steal syndrome    Ulcers  of both lower extremities ([place]) 2015   Past Surgical History:  Procedure Laterality Date   ABDOMINAL AORTOGRAM W/LOWER EXTREMITY N/A 12/19/2021   Procedure: ABDOMINAL AORTOGRAM W/LOWER EXTREMITY;  Surgeon: Serafina Mitchell, MD;  Location: [facility];  Service: Cardiovascular;  Laterality: N/A;   CARDIAC CATHETERIZATION     CATARACT EXTRACTION Bilateral 2013   COLONOSCOPY  11/17/2010   Procedure: COLONOSCOPY;  Surgeon: [name], MD;  [facility];  Service: Endoscopy;  Laterality: N/A;  10:45 am   ESOPHAGOGASTRODUODENOSCOPY  11/17/2010   Procedure: ESOPHAGOGASTRODUODENOSCOPY (EGD);  Surgeon: [name], MD;  [facility];  Service: Endoscopy;  Laterality: N/A;   ESOPHAGOGASTRODUODENOSCOPY N/A 02/16/2015   Procedure: ESOPHAGOGASTRODUODENOSCOPY (EGD);   Surgeon: [name], MD;  Location: [facility];  Service: Gastroenterology;  Laterality: N/A;   EXPLORATORY LAPAROTOMY  1960s   For peritonitis of undetermined cause   EYE SURGERY     LUMBAR EPIDURAL INJECTION  06-2012--06-2013   pt. states she has had 5 epidurals in 06-2012----06-2013   MASTECTOMY Left 1990   PERIPHERAL VASCULAR INTERVENTION Left 12/19/2021   Procedure: PERIPHERAL VASCULAR INTERVENTION;  Surgeon: Serafina Mitchell, MD;  Location: [facility] [facility];  Service: Cardiovascular;  Laterality: Left;  SFA and PTA of POP   YAG LASER APPLICATION Right 9/48/5462   Procedure: YAG LASER APPLICATION;  Surgeon: [name], MD;  Location: [facility];  Service: Ophthalmology;  Laterality: Right;  right   YAG LASER APPLICATION Left 10/10/5007   Procedure: YAG LASER APPLICATION;  Surgeon: [name], MD;  Location: [facility];  Service: Ophthalmology;  Laterality: Left;   Social History:  reports that she quit smoking about 15 years ago. Her smoking use included cigarettes. She has a 56.00 pack-year smoking history. She has never been exposed to tobacco smoke. She has never used smokeless tobacco. She reports that she does not drink alcohol and does not use drugs.  Allergies  Allergen Reactions   Iodine Rash and Other (See Comments)    BETADINE Rash/burning, blisters on skin. Burns skin   Iohexol Rash and Other (See Comments)    Blisters; PT NEEDS 13-HOUR PREP    Povidone-Iodine Other (See Comments)    Burns skin   Tape Itching, Rash and Other (See Comments)    "DO NOT USE ADHESIVE TAPE" Not even a Band Aid" NO PAPER TAPE Burns skin Skin is very very thin    Family History  Problem Relation Age of Onset   Alzheimer's disease Mother    Dementia Mother    Hypertension Mother    Hyperlipidemia Mother    Parkinsonism Father     Prior to Admission medications   Medication Sig Start Date End Date Taking? Authorizing Provider  amLODipine (NORVASC) 5 MG tablet TAKE 1  TABLET DAILY 05/11/21   Josue Hector, MD  CALCIUM PO Take 1,200 mg by mouth daily.    [provider]  clopidogrel (PLAVIX) 75 MG tablet TAKE 1 TABLET EVERY MORNING 08/31/21   Josue Hector, MD  denosumab (PROLIA) 60 MG/ML SOSY injection Inject 60 mg into the skin every 6 (six) months.    Celene Squibb, MD  ELIQUIS 2.5 MG TABS tablet TAKE 1 TABLET TWICE A DAY 11/23/21   Josue Hector, MD  furosemide (LASIX) 20 MG tablet Take 20 mg by mouth daily. Only as needed PRN    [provider]  HYDROcodone-acetaminophen (NORCO/VICODIN) 5-325 MG tablet hydrocodone 5 mg-acetaminophen 325 mg tablet Patient  not taking: Reported on 12/18/2021    [provider]  lisinopril (ZESTRIL) 5 MG tablet Take 5 mg by mouth daily.    [provider]  MAGNESIUM OXIDE PO Take 500 mg by mouth every morning.    [provider]  metoprolol tartrate (LOPRESSOR) 25 MG tablet TAKE 3 TABLETS TWICE A DAY 10/06/21   Josue Hector, MD  Multiple Vitamin (MULTIVITAMIN WITH MINERALS) TABS tablet Take 1 tablet by mouth every morning.    [provider]  pantoprazole (PROTONIX) 40 MG tablet TAKE 1 TABLET DAILY 08/31/21   Josue Hector, MD  potassium chloride (KLOR-CON M) 10 MEQ tablet Take 10 mEq by mouth daily.    [provider]  pravastatin (PRAVACHOL) 40 MG tablet Take 1 tablet (40 mg total) by mouth daily. 07/20/21   Josue Hector, MD  sulfamethoxazole-trimethoprim (BACTRIM DS) 800-160 MG tablet Take 1 tablet by mouth 2 (two) times daily. 12/19/21   Serafina Mitchell, MD  Tiotropium Bromide Monohydrate (SPIRIVA RESPIMAT) 2.5 MCG/ACT AERS USE 2 INHALATIONS BY MOUTH  DAILY 10/25/21   Cobb, Karie Schwalbe, NP  traMADol (ULTRAM) 50 MG tablet Take 50 mg by mouth every 6 (six) hours.    [provider]  vitamin C (ASCORBIC ACID) 500 MG tablet Take 500 mg by mouth every morning.     [provider]    Physical Exam: Vitals:   12/25/21 0030 12/25/21 0100  12/25/21 0130 12/25/21 0200  BP: (!) 85/73 (!) 75/42 (!) 88/59 103/66  Pulse: (!) 105 (!) 106 (!) 102 (!) 103  Resp: 13 (!) 28 (!) 24 15  Temp:      TempSrc:      SpO2: 99% 97% 100% 98%  Weight:      Height:       1.  General: Patient lying supine in bed, chronically ill-appearing, no acute distress   2. Psychiatric: Alert and oriented x 3, mood and behavior normal for situation, pleasant and cooperative with exam   3. Neurologic: Speech and language are normal, face is symmetric, moves all 4 extremities voluntarily, at baseline without acute deficits on limited exam   4. HEENMT:  Head is atraumatic, normocephalic, pupils reactive to light, neck is supple, trachea is midline, mucous membranes are moist   5. Respiratory : Lungs are clear to auscultation bilaterally without wheezing, rhonchi, rales, no cyanosis, no increase in work of breathing or accessory muscle use   6. Cardiovascular : Heart rate normal, rhythm is regular, no murmurs, rubs or gallops, no peripheral edema, peripheral pulses palpated   7. Gastrointestinal:  Abdomen is soft, nondistended, nontender to palpation bowel sounds active, no masses or organomegaly palpated   8. Skin:  Large area over the dorsal aspect of the left foot that is dark in color and appears to be similar to the dry gangrene with some granulation tissue around the borders, also great toe is appearing like dry gangrene as well with tactile sensation intact second digit left foot is swollen and then has a purplish hue   9.Musculoskeletal:  No acute deformities or trauma, no asymmetry in tone, no peripheral edema, peripheral pulses palpated, no tenderness to palpation in the extremities  Data Reviewed: In the ED Temp 96.6, heart rate 51, respiratory rate normal, blood pressure 147/97 initially and then as low as 75/42 Leukocytosis 38.2, hemoglobin 12.0, platelets 161 Chemistry shows a slight hyperkalemia at 5.7 initially but improved after 1 L  bolus, and an AKI with a creatinine  of 2.14 albumin is 3.0 Chest x-ray shows no active disease but stable emphysema X-ray left foot shows possible osteomyelitis Patient was given Zofran, 1 L normal saline, and started on broad-spectrum antibiotics Admission requested for possible sepsis Assessment and Plan: * Sepsis ([place]) - Leukocytosis 38, hypotension down to 75/42, with AKI - Sources to consider include possible osteomyelitis and intra-abdominal infection given the diarrhea - Continue broad-spectrum antibiotics with cefepime, Flagyl, vancomycin - Stool culture and C. difficile PCR pending - Blood cultures pending - Lactic acid uptrending from 2.7, 3.4, continue 150 mL/h IV fluids - Continue to monitor  Osteomyelitis (HCC) - Possible osteomyelitis on left foot x-ray - Pictures in chart from September 8 - Wound and dry gangrene have gotten significantly worse since those pictures - Patient already following outpatient with vascular and recently had stents for peripheral vascular disease - Check ESR and CRP - Continue to monitor  Hyperkalemia - Potassium initially 5.7 after 1 L fluids 4.9 - Continue IV fluids - Trend again in the a.m.  Diarrhea - After taking 2 courses of antibiotics - Concern for C. difficile - Next time Patient has a bowel movement sent for culture - Continue to monitor  AKI (acute kidney injury) ([place]) - Creatinine increased from 1.10>> 2.14 -Bicarb 20 - Prerenal with diarrhea and hypovolemia - 1 L NS in the ED - Continue 150 MLS per hour maintenance fluid - Trend in the a.m. - Hold nephrotoxic agents including lisinopril and Protonix  Protein-calorie malnutrition, moderate (HCC) - Albumin 3.0 - Encourage nutrient dense p.o. intake - Continue to monitor  Atrial fibrillation (HCC) - Continue Eliquis -Holding metoprolol in the setting of hypotension down to 75/42  Essential hypertension - Holding Norvasc, metoprolol, lisinopril, Lasix in the  setting of AKI and hypotension  Hyperlipemia - Continue pravastatin      Advance Care Planning:   Code Status: Prior full  Consults: None  Family Communication: Husband at bedside  Severity of Illness: The appropriate patient status for this patient is INPATIENT. Inpatient status is judged to be reasonable and necessary in order to provide the required intensity of service to ensure the patient's safety. The patient's presenting symptoms, physical exam findings, and initial radiographic and laboratory data in the context of their chronic comorbidities is felt to place them at high risk for further clinical deterioration. Furthermore, it is not anticipated that the patient will be medically stable for discharge from the hospital within 2 midnights of admission.   * I certify that at the point of admission it is my clinical judgment that the patient will require inpatient hospital care spanning beyond 2 midnights from the point of admission due to high intensity of service, high risk for further deterioration and high frequency of surveillance required.*  Author: Rolla Plate, DO 12/25/2021 5:14 AM  For on call review [email or web address].

## 2021-12-25 NOTE — ED Notes (Signed)
Date and time results received: 12/25/21 0239 (use smartphrase ".now" to insert current time)  Test: lactic acid Critical Value: 2.7  Name of Provider Notified: A. Zierle-Ghosh, DO

## 2021-12-25 NOTE — Progress Notes (Signed)
Pharmacy Antibiotic Note  [name] is a 81 y.o. female admitted on 12/24/2021 with sepsis.  Pharmacy has been consulted for Vanc and Cefepime dosing.  CrCl - 15-17 ml/min  Plan: Continue Vancomycin 750mg  mg IV Q 24 hrs. Goal AUC 400-550. Expected AUC: 520 SCr used: 1.94  Cefepime 2 gms Iv q24hr Monitor renal function, clinical status, C&S and vanc levels as needed  Height: 5\' 2"  (157.5 cm) Weight: 50.3 kg (111 lb) IBW/kg (Calculated) : 50.1  Temp (24hrs), Avg:97.3 F (36.3 C), Min:96.6 F (35.9 C), Max:98.1 F (36.7 C)  Recent Labs  Lab 12/19/21 0947 12/19/21 1021 12/25/21 0000 12/25/21 0139 12/25/21 0343 12/25/21 0557 12/25/21 1038  WBC  --   --  38.2*  --   --  33.1* 30.2*  CREATININE 1.20* 1.10* 2.14*  --   --  1.97* 1.94*  LATICACIDVEN  --   --   --  2.7* 3.4* 3.2*  --      Estimated Creatinine Clearance: 17.4 mL/min (A) (by C-G formula based on SCr of 1.94 mg/dL (H)).    Allergies  Allergen Reactions   Iodine Rash and Other (See Comments)    BETADINE Rash/burning, blisters on skin. Burns skin   Iohexol Rash and Other (See Comments)    Blisters; PT NEEDS 13-HOUR PREP    Povidone-Iodine Other (See Comments)    Burns skin   Tape Itching, Rash and Other (See Comments)    "DO NOT USE ADHESIVE TAPE" Not even a Band Aid" NO PAPER TAPE Burns skin Skin is very very thin    Antimicrobials this admission: Vanc 9/18 >>  Cefepime 9/18 >>   Thank you for allowing pharmacy to be a part of this patient's care.  Isac Sarna, BS Pharm D, BCPS Clinical Pharmacist 12/25/2021, 11:35 AM

## 2021-12-25 NOTE — Consult Note (Signed)
Hospital Consult    Reason for Consult:  left leg tissue loss Referring Physician:  Forestine Na ED MRN #:  [id number]  History of Present Illness: This is a 81 y.o. female with multiple risk factors that vascular surgery has been consulted for critical limb ischemia left leg.  She just underwent intervention last week by Dr. Trula Slade with left SFA stent, left popliteal DCB, left peroneal angioplasty on 12/19/2021 for left leg ischemia with tissue loss.  She feels her foot is looking better.  She went to the ED today because of diarrhea and was found to have an elevated white count of 38,000.  Vascular surgery was consulted.  No pain in her left foot.  Doing dressing care.  Past Medical History:  Diagnosis Date   Anemia 05/2014.   Atrial fibrillation ([place])    x 3 yrs   Breast cancer ([place])    S/P left mastectomy and chemotherapy 1989 remained in remission   Carotid artery occlusion    Carotid bruit    LICA 61-60% (duplex 7/37)   Degenerative arthritis of spine 2015   Chronic back pain   Hyperlipidemia    Hypertension    Meningioma ([place])    Osteoporosis    Pneumonia May 19, 2014   Stroke [facility])    CVA 2008   Subclavian steal syndrome    Ulcers of both lower extremities ([place]) 2015    Past Surgical History:  Procedure Laterality Date   ABDOMINAL AORTOGRAM W/LOWER EXTREMITY N/A 12/19/2021   Procedure: ABDOMINAL AORTOGRAM W/LOWER EXTREMITY;  Surgeon: Serafina Mitchell, MD;  Location: [place] [facility];  Service: Cardiovascular;  Laterality: N/A;   CARDIAC CATHETERIZATION     CATARACT EXTRACTION Bilateral 2013   COLONOSCOPY  11/17/2010   Procedure: COLONOSCOPY;  Surgeon: [name], MD;  [facility];  Service: Endoscopy;  Laterality: N/A;  10:45 am   ESOPHAGOGASTRODUODENOSCOPY  11/17/2010   Procedure: ESOPHAGOGASTRODUODENOSCOPY (EGD);  Surgeon: [name], MD;  [facility];  Service: Endoscopy;  Laterality: N/A;   ESOPHAGOGASTRODUODENOSCOPY N/A  02/16/2015   Procedure: ESOPHAGOGASTRODUODENOSCOPY (EGD);  Surgeon: [name], MD;  Location: [place];  Service: Gastroenterology;  Laterality: N/A;   EXPLORATORY LAPAROTOMY  1960s   For peritonitis of undetermined cause   EYE SURGERY     LUMBAR EPIDURAL INJECTION  06-2012--06-2013   pt. states she has had 5 epidurals in 06-2012----06-2013   MASTECTOMY Left 1990   PERIPHERAL VASCULAR INTERVENTION Left 12/19/2021   Procedure: PERIPHERAL VASCULAR INTERVENTION;  Surgeon: Serafina Mitchell, MD;  Location: [place] [facility];  Service: Cardiovascular;  Laterality: Left;  SFA and PTA of POP   YAG LASER APPLICATION Right 04/14/2692   Procedure: YAG LASER APPLICATION;  Surgeon: [name], MD;  Location: [facility];  Service: Ophthalmology;  Laterality: Right;  right   YAG LASER APPLICATION Left 8/54/6270   Procedure: YAG LASER APPLICATION;  Surgeon: [name], MD;  Location: [facility];  Service: Ophthalmology;  Laterality: Left;    Allergies  Allergen Reactions   Iodine Rash and Other (See Comments)    BETADINE Rash/burning, blisters on skin. Burns skin   Iohexol Rash and Other (See Comments)    Blisters; PT NEEDS 13-HOUR PREP    Povidone-Iodine Other (See Comments)    Burns skin   Tape Itching, Rash and Other (See Comments)    "DO NOT USE ADHESIVE TAPE" Not even a Band Aid" NO PAPER TAPE Burns skin Skin is very very thin  Prior to Admission medications   Medication Sig Start Date End Date Taking? Authorizing Provider  amLODipine (NORVASC) 5 MG tablet TAKE 1 TABLET DAILY 05/11/21  Yes Josue Hector, MD  CALCIUM PO Take 1,200 mg by mouth daily.   Yes [provider]  clopidogrel (PLAVIX) 75 MG tablet TAKE 1 TABLET EVERY MORNING 08/31/21  Yes Josue Hector, MD  denosumab (PROLIA) 60 MG/ML SOSY injection Inject 60 mg into the skin every 6 (six) months.   Yes Celene Squibb, MD  ELIQUIS 2.5 MG TABS tablet TAKE 1 TABLET TWICE A DAY 11/23/21  Yes Josue Hector, MD   furosemide (LASIX) 20 MG tablet Take 20 mg by mouth daily. Only as needed PRN   Yes [provider]  lisinopril (ZESTRIL) 5 MG tablet Take 5 mg by mouth daily.   Yes [provider]  MAGNESIUM OXIDE PO Take 500 mg by mouth every morning.   Yes [provider]  metoprolol tartrate (LOPRESSOR) 25 MG tablet TAKE 3 TABLETS TWICE A DAY 10/06/21  Yes Josue Hector, MD  Multiple Vitamin (MULTIVITAMIN WITH MINERALS) TABS tablet Take 1 tablet by mouth every morning.   Yes [provider]  pantoprazole (PROTONIX) 40 MG tablet TAKE 1 TABLET DAILY 08/31/21  Yes Josue Hector, MD  potassium chloride (KLOR-CON M) 10 MEQ tablet Take 10 mEq by mouth daily.   Yes [provider]  pravastatin (PRAVACHOL) 40 MG tablet Take 1 tablet (40 mg total) by mouth daily. 07/20/21  Yes Josue Hector, MD  sulfamethoxazole-trimethoprim (BACTRIM DS) 800-160 MG tablet Take 1 tablet by mouth 2 (two) times daily. 12/19/21  Yes Serafina Mitchell, MD  Tiotropium Bromide Monohydrate (SPIRIVA RESPIMAT) 2.5 MCG/ACT AERS USE 2 INHALATIONS BY MOUTH  DAILY 10/25/21  Yes Cobb, Karie Schwalbe, NP  traMADol (ULTRAM) 50 MG tablet Take 50 mg by mouth every 6 (six) hours.   Yes [provider]  vitamin C (ASCORBIC ACID) 500 MG tablet Take 500 mg by mouth every morning.    Yes [provider]  HYDROcodone-acetaminophen (NORCO/VICODIN) 5-325 MG tablet hydrocodone 5 mg-acetaminophen 325 mg tablet Patient not taking: Reported on 12/18/2021    [provider]    Social History   Socioeconomic History   Marital status: Married    Spouse name: Not on file   Number of children: 2   Years of education: Not on file   Highest education level: Not on file  Occupational History   Occupation: Retired    Fish farm manager: RETIRED  Tobacco Use   Smoking status: Former    Packs/day: 1.00    Years: 56.00    Total pack years: 56.00    Types: Cigarettes    Quit date: 08/14/2006    Years since  quitting: 15.3    Passive exposure: Never   Smokeless tobacco: Never   Tobacco comments:    quit in 2008  Vaping Use   Vaping Use: Never used  Substance and Sexual Activity   Alcohol use: No    Alcohol/week: 0.0 standard drinks of alcohol   Drug use: No   Sexual activity: Never  Other Topics Concern   Not on file  Social History Narrative   Not on file   Social Determinants of Health   Financial Resource Strain: Not on file  Food Insecurity: No Food Insecurity (12/19/2021)   Hunger Vital Sign    Worried About Running Out of Food in the Last Year: Never true  Ran Out of Food in the Last Year: Never true  Transportation Needs: No Transportation Needs (12/19/2021)   PRAPARE - Hydrologist (Medical): No    Lack of Transportation (Non-Medical): No  Physical Activity: Not on file  Stress: Not on file  Social Connections: Not on file  Intimate Partner Violence: Not At Risk (12/19/2021)   Humiliation, Afraid, Rape, and Kick questionnaire    Fear of Current or Ex-Partner: No    Emotionally Abused: No    Physically Abused: No    Sexually Abused: No     Family History  Problem Relation Age of Onset   Alzheimer's disease Mother    Dementia Mother    Hypertension Mother    Hyperlipidemia Mother    Parkinsonism Father     ROS: [x]  Positive   [ ]  Negative   [ ]  All sytems reviewed and are negative  Cardiovascular: []  chest pain/pressure []  palpitations []  SOB lying flat []  DOE []  pain in legs while walking []  pain in legs at rest []  pain in legs at night []  non-healing ulcers []  hx of DVT []  swelling in legs  Pulmonary: []  productive cough []  asthma/wheezing []  home O2  Neurologic: []  weakness in []  arms []  legs []  numbness in []  arms []  legs []  hx of CVA []  mini stroke [] difficulty speaking or slurred speech []  temporary loss of vision in one eye []  dizziness  Hematologic: []  hx of cancer []  bleeding problems []  problems with  blood clotting easily  Endocrine:   []  diabetes []  thyroid disease  GI []  vomiting blood []  blood in stool  GU: []  CKD/renal failure []  HD--[]  M/W/F or []  T/T/S []  burning with urination []  blood in urine  Psychiatric: []  anxiety []  depression  Musculoskeletal: []  arthritis []  joint pain  Integumentary: []  rashes []  ulcers  Constitutional: []  fever []  chills   Physical Examination  Vitals:   12/25/21 1838 12/25/21 2013  BP: 91/71 110/63  Pulse:  (!) 114  Resp: 19 20  Temp:  98.1 F (36.7 C)  SpO2:  95%   Body mass index is 20.3 kg/m.  General:  WDWN in NAD Gait: Not observed HENT: WNL, normocephalic Pulmonary: normal non-labored breathing Cardiac: regular, without  Murmurs, rubs or gallops Abdomen:  soft, NT/ND Vascular Exam/Pulses: Left femoral pulse palpable Left AT and peroneal brisk by doppler Extremities: ischemic changes left foot as pictured Musculoskeletal: no muscle wasting or atrophy  Neurologic: A&O X 3; Appropriate Affect ; SENSATION: normal; MOTOR FUNCTION:  moving all extremities equally. Speech is fluent/normal      CBC    Component Value Date/Time   WBC 30.2 (H) 12/25/2021 1038   RBC 2.76 (L) 12/25/2021 1038   HGB 8.7 (L) 12/25/2021 1038   HCT 27.2 (L) 12/25/2021 1038   PLT 342 12/25/2021 1038   MCV 98.6 12/25/2021 1038   MCH 31.5 12/25/2021 1038   MCHC 32.0 12/25/2021 1038   RDW 14.2 12/25/2021 1038   LYMPHSABS 2.0 12/25/2021 1038   MONOABS 2.0 (H) 12/25/2021 1038   EOSABS 0.0 12/25/2021 1038   BASOSABS 0.1 12/25/2021 1038    BMET    Component Value Date/Time   NA 134 (L) 12/25/2021 1038   NA 136 (A) 08/11/2010 0000   K 4.8 12/25/2021 1038   CL 107 12/25/2021 1038   CO2 18 (L) 12/25/2021 1038   GLUCOSE 80 12/25/2021 1038   BUN 42 (H) 12/25/2021 1038   BUN 21 08/11/2010 0000  CREATININE 1.94 (H) 12/25/2021 1038   CREATININE 0.57 (L) 03/14/2015 1230   CALCIUM 7.8 (L) 12/25/2021 1038   GFRNONAA 25 (L)  12/25/2021 1038   GFRAA >60 02/20/2015 0418    COAGS: Lab Results  Component Value Date   INR 1.4 (H) 12/25/2021   INR 1.44 02/17/2015   INR 1.43 02/16/2015     Non-Invasive Vascular Imaging:    N/A   ASSESSMENT/PLAN: This is a 81 y.o. female transferred from [facility] with critical limb ischemia of the left lower extremity that just underwent intervention last week by Dr. Trula Slade with a left SFA stent, left popliteal DCB and left peroneal angioplasty on 12/19/21.  On exam she has a brisk peroneal signal at the ankle and I think recent leg intervention remains patent.  Her reason for presenting to the ED was for diarrhea and found to have leukocytosis 38,000.  I do not think her foot is making her septic.  I will inform Dr. Trula Slade of her admission.  She missed her follow-up appointment with him today in the office.  Marty Heck, MD [facility] Office: [place]

## 2021-12-25 NOTE — Progress Notes (Signed)
Pharmacy Antibiotic Note  SUKAINA TOOTHAKER is a 81 y.o. female admitted on 12/24/2021 with sepsis.  Pharmacy has been consulted for Vanc and Cefepime dosing.  CrCl - 15 ml/min  Plan: Vanc 1 gm IV x 1, then vanc variable dosing based on changing renal function Cefepime 2 gms Iv q24hr Monitor renal function, clinical status, C&S and vanc levels as needed  Height: 5\' 2"  (157.5 cm) Weight: 50.3 kg (111 lb) IBW/kg (Calculated) : 50.1  Temp (24hrs), Avg:96.6 F (35.9 C), Min:96.6 F (35.9 C), Max:96.6 F (35.9 C)  Recent Labs  Lab 12/19/21 0947 12/19/21 1021 12/25/21 0000  WBC  --   --  38.2*  CREATININE 1.20* 1.10* 2.14*    Estimated Creatinine Clearance: 15.8 mL/min (A) (by C-G formula based on SCr of 2.14 mg/dL (H)).    Allergies  Allergen Reactions   Iodine Rash and Other (See Comments)    BETADINE Rash/burning, blisters on skin. Burns skin   Iohexol Rash and Other (See Comments)    Blisters; PT NEEDS 13-HOUR PREP    Povidone-Iodine Other (See Comments)    Burns skin   Tape Itching, Rash and Other (See Comments)    "DO NOT USE ADHESIVE TAPE" Not even a Band Aid" NO PAPER TAPE Burns skin Skin is very very thin    Antimicrobials this admission: Vanc 9/18 >>  Cefepime 9/18 >>   Thank you for allowing pharmacy to be a part of this patient's care.  Alanda Slim, PharmD, [facility] Clinical Pharmacist Please see AMION for all Pharmacists' Contact Phone Numbers 12/25/2021, 2:16 AM

## 2021-12-25 NOTE — Assessment & Plan Note (Signed)
-   Potassium initially 5.7 after 1 L fluids 4.9 - Continue IV fluids - Trend again in the a.m.

## 2021-12-25 NOTE — Assessment & Plan Note (Signed)
Continue pravastatin 

## 2021-12-25 NOTE — TOC Progression Note (Signed)
  Transition of Care [facility]) Screening Note   Patient Details  Name: SHEILYN BOEHLKE Date of Birth: 14-Feb-1941   Transition of Care [facility]) CM/SW Contact:    Shade Flood, LCSW Phone Number: 12/25/2021, 1:18 PM   Per MD, pt will transfer to [facility] for Vascular services. TOC at [facility] will follow and assist with dc planning as needed.   Transition of Care Department [facility]) has reviewed patient and no TOC needs have been identified at this time. We will continue to monitor patient advancement through interdisciplinary progression rounds. If new patient transition needs arise, please place a TOC consult.

## 2021-12-26 DIAGNOSIS — R6520 Severe sepsis without septic shock: Secondary | ICD-10-CM | POA: Diagnosis not present

## 2021-12-26 DIAGNOSIS — A419 Sepsis, unspecified organism: Secondary | ICD-10-CM | POA: Diagnosis not present

## 2021-12-26 DIAGNOSIS — M79672 Pain in left foot: Secondary | ICD-10-CM

## 2021-12-26 LAB — BASIC METABOLIC PANEL
Anion gap: 7 (ref 5–15)
BUN: 30 mg/dL — ABNORMAL HIGH (ref 8–23)
CO2: 19 mmol/L — ABNORMAL LOW (ref 22–32)
Calcium: 7.6 mg/dL — ABNORMAL LOW (ref 8.9–10.3)
Chloride: 110 mmol/L (ref 98–111)
Creatinine, Ser: 1.7 mg/dL — ABNORMAL HIGH (ref 0.44–1.00)
GFR, Estimated: 30 mL/min — ABNORMAL LOW (ref >60)
Glucose, Bld: 72 mg/dL (ref 70–99)
Potassium: 4 mmol/L (ref 3.5–5.1)
Sodium: 136 mmol/L (ref 135–145)

## 2021-12-26 LAB — CBC WITH DIFFERENTIAL/PLATELET
Abs Immature Granulocytes: 0.34 10*3/uL — ABNORMAL HIGH (ref 0.00–0.07)
Basophils Absolute: 0.1 10*3/uL (ref 0.0–0.1)
Basophils Relative: 0 %
Eosinophils Absolute: 0 10*3/uL (ref 0.0–0.5)
Eosinophils Relative: 0 %
HCT: 25.3 % — ABNORMAL LOW (ref 36.0–46.0)
Hemoglobin: 8.5 g/dL — ABNORMAL LOW (ref 12.0–15.0)
Immature Granulocytes: 1 %
Lymphocytes Relative: 7 %
Lymphs Abs: 1.8 10*3/uL (ref 0.7–4.0)
MCH: 32.2 pg (ref 26.0–34.0)
MCHC: 33.6 g/dL (ref 30.0–36.0)
MCV: 95.8 fL (ref 80.0–100.0)
Monocytes Absolute: 1.8 10*3/uL — ABNORMAL HIGH (ref 0.1–1.0)
Monocytes Relative: 7 %
Neutro Abs: 20.2 10*3/uL — ABNORMAL HIGH (ref 1.7–7.7)
Neutrophils Relative %: 85 %
Platelets: 330 10*3/uL (ref 150–400)
RBC: 2.64 MIL/uL — ABNORMAL LOW (ref 3.87–5.11)
RDW: 14.1 % (ref 11.5–15.5)
WBC: 24.2 10*3/uL — ABNORMAL HIGH (ref 4.0–10.5)
nRBC: 0 % (ref 0.0–0.2)

## 2021-12-26 LAB — PROCALCITONIN: Procalcitonin: 0.5 ng/mL

## 2021-12-26 MED ORDER — JUVEN PO PACK
1.0000 | PACK | Freq: Two times a day (BID) | ORAL | Status: DC
  Administered 2021-12-27 – 2022-01-02 (×11): 1 via ORAL
  Filled 2021-12-26 (×13): qty 1

## 2021-12-26 MED ORDER — BOOST / RESOURCE BREEZE PO LIQD CUSTOM
1.0000 | Freq: Three times a day (TID) | ORAL | Status: DC
  Administered 2021-12-26 – 2022-01-01 (×10): 1 via ORAL

## 2021-12-26 MED ORDER — FENTANYL CITRATE PF 50 MCG/ML IJ SOSY
25.0000 ug | PREFILLED_SYRINGE | Freq: Once | INTRAMUSCULAR | Status: AC
  Administered 2021-12-26: 25 ug via INTRAVENOUS
  Filled 2021-12-26: qty 1

## 2021-12-26 MED ORDER — METOPROLOL TARTRATE 50 MG PO TABS
75.0000 mg | ORAL_TABLET | Freq: Two times a day (BID) | ORAL | Status: DC
  Administered 2021-12-26 – 2021-12-27 (×3): 75 mg via ORAL
  Filled 2021-12-26 (×3): qty 1

## 2021-12-26 MED ORDER — FENTANYL CITRATE PF 50 MCG/ML IJ SOSY: 12.5000 ug | PREFILLED_SYRINGE | Freq: Once | INTRAMUSCULAR | Status: DC

## 2021-12-26 NOTE — Progress Notes (Signed)
Pt in lots of pain to L foot in spite of oral PRN Oxy given, score from 6 to 9, burning discomfort, pt unable to relax and moaning loudly. On call for [facility] contacted and order placed for one time dose of Fentanyl, pt back to green MEWS score following dose, pain level greatly decreased and pt able to sleep

## 2021-12-26 NOTE — Plan of Care (Signed)
  Problem: Education: Goal: Understanding of CV disease, CV risk reduction, and recovery process will improve Outcome: Progressing Goal: Individualized Educational Video(s) Outcome: Progressing   Problem: Activity: Goal: Ability to return to baseline activity level will improve Outcome: Progressing   Problem: Cardiovascular: Goal: Ability to achieve and maintain adequate cardiovascular perfusion will improve Outcome: Progressing Goal: Vascular access site(s) Level 0-1 will be maintained Outcome: Progressing   Problem: Health Behavior/Discharge Planning: Goal: Ability to safely manage health-related needs after discharge will improve Outcome: Progressing   Problem: Fluid Volume: Goal: Hemodynamic stability will improve Outcome: Progressing   Problem: Clinical Measurements: Goal: Diagnostic test results will improve Outcome: Progressing Goal: Signs and symptoms of infection will decrease Outcome: Progressing   Problem: Respiratory: Goal: Ability to maintain adequate ventilation will improve Outcome: Progressing   Problem: Education: Goal: Knowledge of General Education information will improve Description: Including pain rating scale, medication(s)/side effects and non-pharmacologic comfort measures Outcome: Progressing   Problem: Health Behavior/Discharge Planning: Goal: Ability to manage health-related needs will improve Outcome: Progressing   Problem: Clinical Measurements: Goal: Ability to maintain clinical measurements within normal limits will improve Outcome: Progressing Goal: Will remain free from infection Outcome: Progressing Goal: Diagnostic test results will improve Outcome: Progressing Goal: Respiratory complications will improve Outcome: Progressing Goal: Cardiovascular complication will be avoided Outcome: Progressing   Problem: Activity: Goal: Risk for activity intolerance will decrease Outcome: Progressing   Problem: Nutrition: Goal: Adequate  nutrition will be maintained Outcome: Progressing   Problem: Coping: Goal: Level of anxiety will decrease Outcome: Progressing   Problem: Elimination: Goal: Will not experience complications related to bowel motility Outcome: Progressing Goal: Will not experience complications related to urinary retention Outcome: Progressing   Problem: Pain Managment: Goal: General experience of comfort will improve Outcome: Progressing   Problem: Safety: Goal: Ability to remain free from injury will improve Outcome: Progressing   Problem: Skin Integrity: Goal: Risk for impaired skin integrity will decrease Outcome: Progressing

## 2021-12-26 NOTE — Plan of Care (Signed)
  Problem: Education: Goal: Understanding of CV disease, CV risk reduction, and recovery process will improve Outcome: Progressing   Problem: Activity: Goal: Ability to return to baseline activity level will improve Outcome: Progressing   Problem: Health Behavior/Discharge Planning: Goal: Ability to safely manage health-related needs after discharge will improve Outcome: Progressing   Problem: Respiratory: Goal: Ability to maintain adequate ventilation will improve Outcome: Progressing

## 2021-12-26 NOTE — Progress Notes (Signed)
Initial Nutrition Assessment  DOCUMENTATION CODES:   Non-severe (moderate) malnutrition in context of chronic illness  INTERVENTION:  Recommend SLP evaluation for diet appropriateness  Boost Breeze po TID, each supplement provides 250 kcal and 9 grams of protein 1 packet Juven BID, each packet provides 95 calories, 2.5 grams of protein (collagen), and 9.8 grams of carbohydrate (3 grams sugar); also contains 7 grams of L-arginine and L-glutamine, 300 mg vitamin C, 15 mg vitamin E, 1.2 mcg vitamin B-12, 9.5 mg zinc, 200 mg calcium, and 1.5 g  Calcium Beta-hydroxy-Beta-methylbutyrate to support wound healing MVI with minerals daily  NUTRITION DIAGNOSIS:   Moderate Malnutrition related to chronic illness (critical limb ischemia) as evidenced by moderate fat depletion, severe muscle depletion.  GOAL:   Patient will meet greater than or equal to 90% of their needs  MONITOR:   PO intake, Supplement acceptance, Labs, Diet advancement  REASON FOR ASSESSMENT:   Consult Assessment of nutrition requirement/status  ASSESSMENT:   Pt admitted with diarrhea. PMH significant for afib, anemia, HLD, HTN, stroke, PVD.  Sepsis r/t L great toe osteomyelitis. Vascular Surgery following for possible amputation. Continue wound care and IV abx for now.   Pt resting comfortably in bed. She states that she has had a poor and decreasing appetite since the beginning of August related to her toe pain. She has been unable to walk within this time so her husband has been preparing meals and she reports that he has been concerned about her lack of meal intake.   Pt transferred from [facility] on dysphagia 2 diet. She states that she has not had difficulty chewing or swallowing foods. She did not bring her upper denture plates to the hospital as she was concerned this would be one more thing to worry about. She does have some upper and lower teeth however. Reached out to MD with recommendation to place SLP consult to  evaluate for most appropriate diet for pt.   No documented meal completions to review.   She has previously tried Ensure and Boost products but does not enjoy these. She is agreeable to try Boost Breeze to see if she may enjoy this better. We also discussed trying Juven to support wound healing.  Pt reports ~10-15 lb weight loss since August d/t poor PO intake. Reviewed weight history. Over all her weight appears to have remained stable within the last year. Noted a 7% weight loss between 7/19 and 9/12 however her weight upon admission appears to almost be back at baseline.   Medications: IV abx  Labs: BUN 30, Cr 1.70, corrected ca 8.8, GFR 30  NUTRITION - FOCUSED PHYSICAL EXAM:  Flowsheet Row Most Recent Value  Orbital Region Moderate depletion  Upper Arm Region Severe depletion  Thoracic and Lumbar Region Moderate depletion  Buccal Region Moderate depletion  Temple Region Moderate depletion  Clavicle Bone Region Severe depletion  Clavicle and Acromion Bone Region Severe depletion  Scapular Bone Region Severe depletion  Dorsal Hand Severe depletion  Patellar Region Severe depletion  Anterior Thigh Region Severe depletion  Posterior Calf Region Severe depletion  Edema (RD Assessment) None  Hair Reviewed  Eyes Reviewed  Mouth Other (Comment)  [poor dentition]  Skin Reviewed  Nails Reviewed       Diet Order:   Diet Order             DIET DYS 2 Room service appropriate? Yes; Fluid consistency: Thin  Diet effective now  EDUCATION NEEDS:   Education needs have been addressed  Skin:  Skin Assessment: Reviewed RN Assessment  Last BM:  9/18  Height:   Ht Readings from Last 1 Encounters:  12/24/21 5\' 2"  (1.575 m)    Weight:   Wt Readings from Last 1 Encounters:  12/24/21 50.3 kg    BMI:  Body mass index is 20.3 kg/m.  Estimated Nutritional Needs:   Kcal:  1300-1500  Protein:  70-85g  Fluid:  >/=1.5L  Clayborne Dana, RDN,  LDN Clinical Nutrition

## 2021-12-26 NOTE — Progress Notes (Signed)
PROGRESS NOTE    [name]  OPF:[id number] DOB: 1940/04/26 DOA: 12/24/2021 PCP: Celene Squibb, MD   Brief Narrative:  [name] is a 81 y.o. female with medical history significant of atrial fibrillation, anemia, hyperlipidemia, hypertension, stroke, peripheral vascular disease, and more presented to the ED with a chief complaint of diarrhea.  Patient reports the diarrhea started at 1 PM on the day of presentation.  He had progressively worse with more frequency throughout the day.  By 6 PM patient was having a bowel movement every 10 minutes.  She describes it as watery.  There was no black, coffee-ground, red color to it.  Patient denied any abdominal pain.  She reports no fever but does report chills.  She has had no nausea and vomiting.  She reports her last meal was 2-3 weeks ago.  She was on to explain that she had had a foot wound that was so painful it was reducing her appetite.  She followed with vascular and on Tuesday they put stents in her left lower extremity which improved the pain in her left foot tremendously.  Since then she has been eating a little more, but still not back to her normal appetite.  She is not sure how much weight she is lost.  Prior to vascular taking her to the OR, they started her on Bactrim.  She thinks she is taking 2 courses of Bactrim the first 1 in mid August for 10 days and the second 1 started 9 days ago.  Patient has never had C. difficile before.   As for the wound it started off about the size of a nickel on his big toe.  It was painful.  They are cleaning it with wound wash.  Because of the pain and applying Salonpas on it and when they pulled that off it ripped the skin off.  This made the surrounding skin slough off and now she has a large area on the dorsal aspect of her foot that has been stripped of skin.  They do report there is some drainage.  It was like a blister on the dorsum of the foot and they poked it with a needle and expressed purulence  and red fluid from it.  Assessment & Plan:   Principal Problem:   Severe sepsis ([place]) Active Problems:   Hyperlipemia   Essential hypertension   Atrial fibrillation (HCC)   Protein-calorie malnutrition, moderate (HCC)   AKI (acute kidney injury) ([place])   Diarrhea   Hyperkalemia   Osteomyelitis of great toe of left foot (HCC)  Severe sepsis secondary to left great toe osteomyelitis: Patient met criteria for sepsis sepsis based on leukocytosis 38, tachycardia, tachypnea lactic acid> 2 as well as hypotension down to 75/42, with AKI.  Seen by vascular surgery.  They do not think that the source of sepsis is her foot.  However MRI clearly indicates osteomyelitis.  Waiting for  Dr. Trula Slade to see patient today.  We will continue broad-spectrum antibiotics including cefepime, Flagyl and vancomycin in the meantime.  Hyperkalemia: Resolved.  Diarrhea: C. difficile and stool pathogen panel pending.  However she states that she has no more diarrhea since yesterday.   AKI (acute kidney injury) ([place]): Secondary to dehydration, diarrhea, hypotension leading to ATN.  Slight improvement in creatinine.  Continue IV fluids.  Avoid nephrotoxic agents.   Protein-calorie malnutrition, moderate ([place]): We will consult nutrition/dietitian.   Paroxysmal atrial fibrillation [facility]): Rate slightly elevated.  Metoprolol was held due to  low blood pressure but blood pressure is improved now.  Will resume metoprolol.  Eliquis on hold and I will continue to hold it until we have further clarification from vascular surgery if patient is going to need any surgical intervention.   History of essential hypertension but presented with severe hypotension:Holding Norvasc,isinopril, Lasix in the setting of AKI and hypotension   Hyperlipemia: - Continue pravastatin  DVT prophylaxis: SCDs Start: 12/25/21 0957   Code Status: Full Code  Family Communication:  None present at bedside.  Plan of care discussed with patient in  length and he/she verbalized understanding and agreed with it.  Status is: Inpatient Remains inpatient appropriate because: Needs evaluation by vascular surgery.   Estimated body mass index is 20.3 kg/m as calculated from the following:   Height as of this encounter: _0  (1.575 m).   Weight as of this encounter: 50.3 kg.    Nutritional Assessment: Body mass index is 20.3 kg/m.Marland Kitchen Seen by dietician.  I agree with the assessment and plan as outlined below: Nutrition Status:        . Skin Assessment: I have examined the patient's skin and I agree with the wound assessment as performed by the wound care RN as outlined below:    Consultants:  Vascular surgery  Procedures:  None  Antimicrobials:  Anti-infectives (From admission, onward)    Start     Dose/Rate Route Frequency Ordered Stop   12/26/21 1200  ceFEPIme (MAXIPIME) 2 g in sodium chloride 0.9 % 100 mL IVPB        2 g 200 mL/hr over 30 Minutes Intravenous Every 24 hours 12/25/21 0211 01/02/22 1159   12/26/21 0500  vancomycin (VANCOREADY) IVPB 750 mg/150 mL        750 mg 150 mL/hr over 60 Minutes Intravenous Every 24 hours 12/25/21 1145     12/25/21 0213  vancomycin variable dose per unstable renal function (pharmacist dosing)  Status:  Discontinued         Does not apply See admin instructions 12/25/21 0213 12/25/21 1145   12/25/21 0145  vancomycin (VANCOCIN) IVPB 1000 mg/200 mL premix        1,000 mg 200 mL/hr over 60 Minutes Intravenous  Once 12/25/21 0139 12/25/21 0606   12/25/21 0145  ceFEPIme (MAXIPIME) 2 g in sodium chloride 0.9 % 100 mL IVPB        2 g 200 mL/hr over 30 Minutes Intravenous  Once 12/25/21 0139 12/25/21 0549   12/25/21 0145  metroNIDAZOLE (FLAGYL) IVPB 500 mg        500 mg 100 mL/hr over 60 Minutes Intravenous Every 12 hours 12/25/21 0140           Subjective: Patient seen and examined.  She states that her only complaint is left foot pain.  Diarrhea is improved and has not had any  loose stools since yesterday.  No other complaint.  Objective: Vitals:   12/26/21 0300 12/26/21 0340 12/26/21 0400 12/26/21 0445  BP:  (!) 123/58  (!) 107/57  Pulse: (!) 117 (!) 108 (!) 117 (!) 103  Resp: _1 Temp:  98.8 F (37.1 C)  98.4 F (36.9 C)  TempSrc:  Oral  Oral  SpO2: 100% 99% 100% 96%  Weight:      Height:        Intake/Output Summary (Last 24 hours) at 12/26/2021 0834 Last data filed at 12/26/2021 0600 Gross per 24 hour  Intake 3925 ml  Output --  Net  3925 ml   Filed Weights   12/24/21 2333  Weight: 50.3 kg    Examination:  General exam: Appears calm and comfortable  Respiratory system: Clear to auscultation. Respiratory effort normal. Cardiovascular system: S1 & S2 heard, RRR. No JVD, murmurs, rubs, gallops or clicks. No pedal edema. Gastrointestinal system: Abdomen is nondistended, soft and nontender. No organomegaly or masses felt. Normal bowel sounds heard. Central nervous system: Alert and oriented. No focal neurological deficits. Extremities: Dressing in the left foot. Psychiatry: Judgement and insight appear normal. Mood & affect appropriate.    Data Reviewed: I have personally reviewed following labs and imaging studies  CBC: Recent Labs  Lab 12/19/21 1021 12/25/21 0000 12/25/21 0557 12/25/21 1038 12/26/21 0123  WBC  --  38.2* 33.1* 30.2* 24.2*  NEUTROABS  --   --   --  25.7* 20.2*  HGB 12.2 12.0 10.4* 8.7* 8.5*  HCT 36.0 36.7 31.8* 27.2* 25.3*  MCV  --  97.3 99.1 98.6 95.8  PLT  --  461* 377 342 324   Basic Metabolic Panel: Recent Labs  Lab 12/19/21 1021 12/25/21 0000 12/25/21 0343 12/25/21 0557 12/25/21 1038 12/26/21 0123  NA 135 134*  --  134* 134* 136  K 4.7 5.7* 4.9 4.9 4.8 4.0  CL 99 100  --  103 107 110  CO2  --  20*  --  20* 18* 19*  GLUCOSE 77 159*  --  127* 80 72  BUN 36* 44*  --  44* 42* 30*  CREATININE 1.10* 2.14*  --  1.97* 1.94* 1.70*  CALCIUM  --  9.1  --  8.1* 7.8* 7.6*  MG  --   --   --   --  2.3   --    GFR: Estimated Creatinine Clearance: 19.8 mL/min (A) (by C-G formula based on SCr of 1.7 mg/dL (H)). Liver Function Tests: Recent Labs  Lab 12/25/21 0000 12/25/21 0557 12/25/21 1038  AST 49* 39 37  ALT 40 31 28  ALKPHOS 65 51 50  BILITOT 0.6 0.8 0.6  PROT 7.3 5.9* 5.8*  ALBUMIN 3.0* 2.5* 2.5*   Recent Labs  Lab 12/25/21 0000  LIPASE 36   No results for input(s): "AMMONIA" in the last 168 hours. Coagulation Profile: Recent Labs  Lab 12/25/21 1038  INR 1.4*   Cardiac Enzymes: No results for input(s): "CKTOTAL", "CKMB", "CKMBINDEX", "TROPONINI" in the last 168 hours. BNP (last 3 results) No results for input(s): "PROBNP" in the last 8760 hours. HbA1C: No results for input(s): "HGBA1C" in the last 72 hours. CBG: No results for input(s): "GLUCAP" in the last 168 hours. Lipid Profile: No results for input(s): "CHOL", "HDL", "LDLCALC", "TRIG", "CHOLHDL", "LDLDIRECT" in the last 72 hours. Thyroid Function Tests: No results for input(s): "TSH", "T4TOTAL", "FREET4", "T3FREE", "THYROIDAB" in the last 72 hours. Anemia Panel: No results for input(s): "VITAMINB12", "FOLATE", "FERRITIN", "TIBC", "IRON", "RETICCTPCT" in the last 72 hours. Sepsis Labs: Recent Labs  Lab 12/25/21 0139 12/25/21 0343 12/25/21 0557 12/25/21 1159 12/26/21 0123  PROCALCITON  --  0.72  --   --  0.50  LATICACIDVEN 2.7* 3.4* 3.2* 1.4  --     Recent Results (from the past 240 hour(s))  Culture, blood (routine x 2)     Status: None (Preliminary result)   Collection Time: 12/25/21  1:15 AM   Specimen: BLOOD LEFT HAND  Result Value Ref Range Status   Specimen Description BLOOD LEFT HAND BOTTLES DRAWN AEROBIC ONLY  Final   Special Requests  Final    Blood Culture results may not be optimal due to an inadequate volume of blood received in culture bottles   Culture   Final    NO GROWTH 1 DAY Performed at [facility], [address] [place], [place] [address]    Report Status PENDING   Incomplete  Culture, blood (routine x 2)     Status: None (Preliminary result)   Collection Time: 12/25/21  1:30 AM   Specimen: Left Antecubital; Blood  Result Value Ref Range Status   Specimen Description   Final    LEFT ANTECUBITAL BOTTLES DRAWN AEROBIC AND ANAEROBIC   Special Requests Blood Culture adequate volume  Final   Culture  Setup Time ANAEROBIC BOTTLE ONLY NO ORGANISMS SEEN   Final   Culture   Final    NO GROWTH 1 DAY Performed at [facility], [address], [place], [place] [address]    Report Status PENDING  Incomplete     Radiology Studies: MR FOOT LEFT WO CONTRAST  Result Date: 12/25/2021 CLINICAL DATA:  Foot swelling, nondiabetic, osteomyelitis suspected EXAM: MRI OF THE LEFT FOOT WITHOUT CONTRAST TECHNIQUE: Multiplanar, multisequence MR imaging of the left forefoot was performed. No intravenous contrast was administered. COMPARISON:  X-ray 12/25/2021 FINDINGS: Technical Note: Despite efforts by the technologist and patient, motion artifact is present on today's exam and could not be eliminated. This reduces exam sensitivity and specificity. Bones/Joint/Cartilage There is bone marrow edema throughout the distal phalanx of the great toe with patchy areas of intermediate T1 marrow signal. No focal erosion. There is mild bone marrow edema along the medial aspect of the base of the great toe proximal phalanx and within the first metatarsal head. Additionally there is mode marrow edema within the head of the second toe proximal phalanx. Preserved fatty T1 marrow signal at these locations without marrow replacement or erosion. Relatively increased T2 signal within the third, fourth, and fifth toes as well as the fifth metatarsal head is felt to be artifactual secondary to poor fat saturation. No evidence of fracture. No dislocation. Mild degenerative changes of the forefoot, most pronounced at first and second MTP joints. No sizable joint effusion. Ligaments Intact Lisfranc ligament.  No evidence of collateral ligament injury. Muscles and Tendons Chronic denervation changes of the foot musculature. Intact flexor and extensor tendons. No tenosynovitis. Soft tissues Soft tissue wound at the medial aspect of the great toe. Generalized soft tissue edema. No organized fluid collection. IMPRESSION: 1. Soft tissue wound at the medial aspect of the great toe with findings suspicious for early acute osteomyelitis of the great toe distal phalanx. 2. Mild bone marrow edema within the base of the great toe proximal phalanx, first metatarsal, and of the second toe proximal phalanx without marrow replacement or erosion. Findings are favored to reflect reactive osteitis without findings of acute osteomyelitis at this time. 3. Mild degenerative changes of the forefoot, most pronounced at first and second MTP joints. Electronically Signed   By: Davina Poke D.O.   On: 12/25/2021 10:05   DG Foot Complete Left  Result Date: 12/25/2021 CLINICAL DATA:  Possible osteomyelitis, multiple wounds on left foot. EXAM: LEFT FOOT - COMPLETE 3+ VIEW COMPARISON:  None Available. FINDINGS: There is no evidence of fracture or dislocation. Evaluation for osteomyelitis is limited due to osteopenia. There questionable lucencies along the lateral aspect of the tuft of the first and second digits. Soft tissues are unremarkable. IMPRESSION: Possible lucencies at the tufts of the distal phalanx of the first  and second digits, the possibility of osteomyelitis can not be excluded. Consider MRI for further evaluation. Electronically Signed   By: Brett Fairy M.D.   On: 12/25/2021 01:01   DG Chest Port 1 View  Result Date: 12/25/2021 CLINICAL DATA:  Leukocytosis, diarrhea. EXAM: PORTABLE CHEST 1 VIEW COMPARISON:  12/19/2018 FINDINGS: The heart size and mediastinal contours are within normal limits. There is atherosclerotic calcification of the aorta. Emphysematous changes are noted in the lungs. There is stable scarring in the  mid to right lower lung. No consolidation, effusion, or pneumothorax. Surgical clips are noted in the left upper chest. No acute osseous abnormality. IMPRESSION: 1. No active disease. 2. Emphysematous changes and stable scarring in the right lung. Electronically Signed   By: Brett Fairy M.D.   On: 12/25/2021 00:45    Scheduled Meds:  pravastatin  40 mg Oral Daily   Continuous Infusions:  sodium chloride 125 mL/hr at 12/26/21 0058   ceFEPime (MAXIPIME) IV     metronidazole 500 mg (12/26/21 0136)   vancomycin 750 mg (12/26/21 0439)     LOS: 1 day   Darliss Cheney, MD [facility]  12/26/2021, 8:34 AM   *Please note that this is a verbal dictation therefore any spelling or grammatical errors are due to the "[employer]" system interpretation.  Please page via [place] and do not message via secure chat for urgent patient care matters. Secure chat can be used for non urgent patient care matters.  How to contact the [facility] Attending or Consulting provider [place] or covering provider during after hours [place], for this patient?  Check the care team in [facility] and look for a) attending/consulting [facility] provider listed and b) the [facility] team listed. Page or secure chat [phone]. Log into [email or web address] and use [place]'s universal password to access. If you do not have the password, please contact the hospital operator. Locate the [facility] provider you are looking for under [facility] and page to a number that you can be directly reached. If you still have difficulty reaching the provider, please page the [facility] (Director on Call) for the Hospitalists listed on amion for assistance.

## 2021-12-26 NOTE — Progress Notes (Addendum)
  Progress Note    12/26/2021 7:49 AM * No surgery found *  Subjective:  says she had significant pain in left foot this morning. Now much better. Say she intermittently has had some burning /tingling in her foot over past week but this morning was the worst   Vitals:   12/26/21 0400 12/26/21 0445  BP:  (!) 107/57  Pulse: (!) 117 (!) 103  Resp: 15 14  Temp:  98.4 F (36.9 C)  SpO2: 100% 96%   Physical Exam: Cardiac:  irregular Lungs:  non labored Extremities:  ble well perfused and warm. Doppler Peroneal signals bilaterally Abdomen:  soft Neurologic: alert and oriented  CBC    Component Value Date/Time   WBC 24.2 (H) 12/26/2021 0123   RBC 2.64 (L) 12/26/2021 0123   HGB 8.5 (L) 12/26/2021 0123   HCT 25.3 (L) 12/26/2021 0123   PLT 330 12/26/2021 0123   MCV 95.8 12/26/2021 0123   MCH 32.2 12/26/2021 0123   MCHC 33.6 12/26/2021 0123   RDW 14.1 12/26/2021 0123   LYMPHSABS 1.8 12/26/2021 0123   MONOABS 1.8 (H) 12/26/2021 0123   EOSABS 0.0 12/26/2021 0123   BASOSABS 0.1 12/26/2021 0123    BMET    Component Value Date/Time   NA 136 12/26/2021 0123   NA 136 (A) 08/11/2010 0000   K 4.0 12/26/2021 0123   CL 110 12/26/2021 0123   CO2 19 (L) 12/26/2021 0123   GLUCOSE 72 12/26/2021 0123   BUN 30 (H) 12/26/2021 0123   BUN 21 08/11/2010 0000   CREATININE 1.70 (H) 12/26/2021 0123   CREATININE 0.57 (L) 03/14/2015 1230   CALCIUM 7.6 (L) 12/26/2021 0123   GFRNONAA 30 (L) 12/26/2021 0123   GFRAA >60 02/20/2015 0418    INR    Component Value Date/Time   INR 1.4 (H) 12/25/2021 1038   INR 3.2 06/14/2010 0840     Intake/Output Summary (Last 24 hours) at 12/26/2021 0749 Last data filed at 12/26/2021 0600 Gross per 24 hour  Intake 3925 ml  Output --  Net 3925 ml     Assessment/Plan:  81 y.o. female who was admitted with diarrhea and WBC of 38,000. Her diarrhea is improving. She is recently s/p left SFA stent, left popliteal DCB, left peroneal angioplasty on 12/19/2021  for left leg ischemia with tissue loss. Overall she feels her left leg is improving slowly. She has brisk signals in her left leg. Do not feel that it is her foot making her septic.WBC trending down. On Vanc and Cefepime. Overall pain has been minimal.  Continue local wound care. Dr. Trula Slade to see patient today    Karoline Caldwell, PA-C [facility] [phone] 12/26/2021 7:49 AM  I agree with the above.  I have seen and examined the patient.  She was admitted with sepsis, diarrhea, and elevated WBC with gangrenous left foot.  Her foot does not appear to be the major issue, as she has improved blood flow from her recent intervention, and it appears improved from when I last saw it 1 week ago.  I Don't see results from her CDiff studies.  I will continue to let her foot wounds demarcate before determining the level of amputation.  Continuee wound care and IV abx.  We will follow.  Annamarie Major

## 2021-12-27 DIAGNOSIS — R6520 Severe sepsis without septic shock: Secondary | ICD-10-CM | POA: Diagnosis not present

## 2021-12-27 DIAGNOSIS — A419 Sepsis, unspecified organism: Secondary | ICD-10-CM | POA: Diagnosis not present

## 2021-12-27 DIAGNOSIS — M79672 Pain in left foot: Secondary | ICD-10-CM | POA: Diagnosis not present

## 2021-12-27 LAB — CBC WITH DIFFERENTIAL/PLATELET
Abs Immature Granulocytes: 0.24 10*3/uL — ABNORMAL HIGH (ref 0.00–0.07)
Basophils Absolute: 0 10*3/uL (ref 0.0–0.1)
Basophils Relative: 0 %
Eosinophils Absolute: 0.2 10*3/uL (ref 0.0–0.5)
Eosinophils Relative: 1 %
HCT: 24 % — ABNORMAL LOW (ref 36.0–46.0)
Hemoglobin: 7.8 g/dL — ABNORMAL LOW (ref 12.0–15.0)
Immature Granulocytes: 1 %
Lymphocytes Relative: 11 %
Lymphs Abs: 2 10*3/uL (ref 0.7–4.0)
MCH: 31.5 pg (ref 26.0–34.0)
MCHC: 32.5 g/dL (ref 30.0–36.0)
MCV: 96.8 fL (ref 80.0–100.0)
Monocytes Absolute: 1.7 10*3/uL — ABNORMAL HIGH (ref 0.1–1.0)
Monocytes Relative: 9 %
Neutro Abs: 14.5 10*3/uL — ABNORMAL HIGH (ref 1.7–7.7)
Neutrophils Relative %: 78 %
Platelets: 327 10*3/uL (ref 150–400)
RBC: 2.48 MIL/uL — ABNORMAL LOW (ref 3.87–5.11)
RDW: 14.4 % (ref 11.5–15.5)
WBC: 18.7 10*3/uL — ABNORMAL HIGH (ref 4.0–10.5)
nRBC: 0 % (ref 0.0–0.2)

## 2021-12-27 LAB — BASIC METABOLIC PANEL
Anion gap: 4 — ABNORMAL LOW (ref 5–15)
BUN: 18 mg/dL (ref 8–23)
CO2: 19 mmol/L — ABNORMAL LOW (ref 22–32)
Calcium: 7.2 mg/dL — ABNORMAL LOW (ref 8.9–10.3)
Chloride: 116 mmol/L — ABNORMAL HIGH (ref 98–111)
Creatinine, Ser: 1.13 mg/dL — ABNORMAL HIGH (ref 0.44–1.00)
GFR, Estimated: 48 mL/min — ABNORMAL LOW (ref >60)
Glucose, Bld: 80 mg/dL (ref 70–99)
Potassium: 5.3 mmol/L — ABNORMAL HIGH (ref 3.5–5.1)
Sodium: 139 mmol/L (ref 135–145)

## 2021-12-27 LAB — PROCALCITONIN: Procalcitonin: 0.27 ng/mL

## 2021-12-27 MED ORDER — VANCOMYCIN HCL 500 MG/100ML IV SOLN
500.0000 mg | INTRAVENOUS | Status: DC
  Administered 2021-12-28: 500 mg via INTRAVENOUS
  Filled 2021-12-27: qty 100

## 2021-12-27 MED ORDER — SODIUM ZIRCONIUM CYCLOSILICATE 10 G PO PACK
10.0000 g | PACK | Freq: Once | ORAL | Status: AC
  Administered 2021-12-27: 10 g via ORAL
  Filled 2021-12-27: qty 1

## 2021-12-27 MED ORDER — METOPROLOL TARTRATE 50 MG PO TABS
100.0000 mg | ORAL_TABLET | Freq: Two times a day (BID) | ORAL | Status: DC
  Administered 2021-12-27 – 2022-01-02 (×12): 100 mg via ORAL
  Filled 2021-12-27 (×12): qty 2

## 2021-12-27 MED ORDER — METRONIDAZOLE 500 MG PO TABS
500.0000 mg | ORAL_TABLET | Freq: Two times a day (BID) | ORAL | Status: DC
  Administered 2021-12-27 – 2022-01-01 (×11): 500 mg via ORAL
  Filled 2021-12-27 (×11): qty 1

## 2021-12-27 MED ORDER — ENOXAPARIN SODIUM 40 MG/0.4ML IJ SOSY
40.0000 mg | PREFILLED_SYRINGE | Freq: Once | INTRAMUSCULAR | Status: AC
  Administered 2021-12-27: 40 mg via SUBCUTANEOUS
  Filled 2021-12-27: qty 0.4

## 2021-12-27 MED ORDER — METOPROLOL TARTRATE 25 MG PO TABS
25.0000 mg | ORAL_TABLET | Freq: Once | ORAL | Status: AC
  Administered 2021-12-27: 25 mg via ORAL
  Filled 2021-12-27: qty 1

## 2021-12-27 NOTE — Plan of Care (Signed)
  Problem: Activity: Goal: Ability to return to baseline activity level will improve Outcome: Progressing   Problem: Fluid Volume: Goal: Hemodynamic stability will improve Outcome: Progressing   Problem: Education: Goal: Knowledge of General Education information will improve Description: Including pain rating scale, medication(s)/side effects and non-pharmacologic comfort measures Outcome: Progressing

## 2021-12-27 NOTE — Progress Notes (Signed)
MD was informed that pt continues to have high HR between 115-118. MD ordered to give the night dose of Metoprolol 100mg . Will inform Night RN.  12/27/21 1700  Assess: MEWS Score  Pulse Rate (!) 118  ECG Heart Rate (!) 118  Resp (!) 22  SpO2 96 %  Assess: MEWS Score  MEWS Temp 0  MEWS Systolic 0  MEWS Pulse 2  MEWS RR 1  MEWS LOC 0  MEWS Score 3  MEWS Score Color Yellow  Assess: if the MEWS score is Yellow or Red  Were vital signs taken at a resting state? Yes  Focused Assessment No change from prior assessment  Does the patient meet 2 or more of the SIRS criteria? Yes  Does the patient have a confirmed or suspected source of infection? Yes  Provider and Rapid Response Notified? No  MEWS guidelines implemented *See Row Information* No, previously yellow, continue vital signs every 4 hours  Notify: Provider  Provider response Other (Comment) (MD ordered to give night dose of Metoprolol.)  Date of Provider Response 12/27/21  Time of Provider Response 1730  Assess: SIRS CRITERIA  SIRS Temperature  0  SIRS Pulse 1  SIRS Respirations  1  SIRS WBC 1  SIRS Score Sum  3

## 2021-12-27 NOTE — Progress Notes (Signed)
PROGRESS NOTE    [name]  DTO:[id number] DOB: 1940/08/16 DOA: 12/24/2021 PCP: Celene Squibb, MD   Brief Narrative:  ALAINAH [name] is a 81 y.o. female with medical history significant of atrial fibrillation, anemia, hyperlipidemia, hypertension, stroke, peripheral vascular disease, and more presented to the ED with a chief complaint of diarrhea.  Patient reports the diarrhea started at 1 PM on the day of presentation.  He had progressively worse with more frequency throughout the day.  By 6 PM patient was having a bowel movement every 10 minutes.  She describes it as watery.  There was no black, coffee-ground, red color to it.  Patient denied any abdominal pain.  She reports no fever but does report chills.  She has had no nausea and vomiting.  She reports her last meal was 2-3 weeks ago.  She was on to explain that she had had a foot wound that was so painful it was reducing her appetite.  She followed with vascular and on Tuesday they put stents in her left lower extremity which improved the pain in her left foot tremendously.  Since then she has been eating a little more, but still not back to her normal appetite.  She is not sure how much weight she is lost.  Prior to vascular taking her to the OR, they started her on Bactrim.  She thinks she is taking 2 courses of Bactrim the first 1 in mid August for 10 days and the second 1 started 9 days ago.  Patient has never had C. difficile before.   As for the wound it started off about the size of a nickel on his big toe.  It was painful.  They are cleaning it with wound wash.  Because of the pain and applying Salonpas on it and when they pulled that off it ripped the skin off.  This made the surrounding skin slough off and now she has a large area on the dorsal aspect of her foot that has been stripped of skin.  They do report there is some drainage.  It was like a blister on the dorsum of the foot and they poked it with a needle and expressed purulence  and red fluid from it.  Assessment & Plan:   Principal Problem:   Severe sepsis ([place]) Active Problems:   Hyperlipemia   Essential hypertension   Atrial fibrillation (HCC)   Protein-calorie malnutrition, moderate (HCC)   AKI (acute kidney injury) ([place])   Diarrhea   Hyperkalemia   Osteomyelitis of great toe of left foot (HCC)  Severe sepsis secondary to left great toe osteomyelitis: Patient met criteria for severe sepsis based on leukocytosis 38, tachycardia, tachypnea lactic acid> 2 as well as hypotension down to 75/42, with AKI.  Seen by vascular surgery.  They do not think that the source of sepsis is her foot.  However MRI clearly indicates osteomyelitis.  Discussed with Dr. Trula Slade over secure chat expressing my concern that patient source of sepsis is likely osteomyelitis as patient has not had any diarrhea for the last 48 hours so that pretty much rules out C. difficile.  He mentioned that he will change the dressing for the patient today and reassess again.  We will continue broad-spectrum antibiotics including cefepime, Flagyl and vancomycin in the meantime.  Hyperkalemia: 5.3.  We will try Lokelma.  Diarrhea: C. difficile and stool pathogen panel pending because patient has not had any diarrhea in last 2 days.  I doubt C.  difficile as the source of sepsis.   AKI (acute kidney injury) ([place]): Secondary to dehydration, diarrhea, hypotension leading to ATN.  Slight improvement in creatinine.  Continue IV fluids.  Avoid nephrotoxic agents.   Protein-calorie malnutrition, moderate [facility]): Dietitian consulted.   Paroxysmal atrial fibrillation [facility]): Rate slightly elevated despite of receiving her usual dose of metoprolol 75 mg.  We will give her 25 mg more and increase metoprolol to 100 mg p.o. twice daily.  History of essential hypertension but presented with severe hypotension:Holding Norvasc,isinopril, Lasix in the setting of AKI and hypotension   Hyperlipemia: - Continue  pravastatin  DVT prophylaxis: SCDs Start: 12/25/21 0957   Code Status: Full Code  Family Communication:  None present at bedside.  Plan of care discussed with patient in length and he/she verbalized understanding and agreed with it.  Status is: Inpatient Remains inpatient appropriate because: Needs evaluation by vascular surgery.   Estimated body mass index is 20.3 kg/m as calculated from the following:   Height as of this encounter: $RemoveBeforeD'5\' 2"'[id number]$  (1.575 m).   Weight as of this encounter: 50.3 kg.    Nutritional Assessment: Body mass index is 20.3 kg/m.Marland Kitchen Seen by dietician.  I agree with the assessment and plan as outlined below: Nutrition Status: Nutrition Problem: Moderate Malnutrition Etiology: chronic illness (critical limb ischemia) Signs/Symptoms: moderate fat depletion, severe muscle depletion Interventions: MVI, Boost Breeze, Juven  . Skin Assessment: I have examined the patient's skin and I agree with the wound assessment as performed by the wound care RN as outlined below:    Consultants:  Vascular surgery  Procedures:  None  Antimicrobials:  Anti-infectives (From admission, onward)    Start     Dose/Rate Route Frequency Ordered Stop   12/26/21 1200  ceFEPIme (MAXIPIME) 2 g in sodium chloride 0.9 % 100 mL IVPB        2 g 200 mL/hr over 30 Minutes Intravenous Every 24 hours 12/25/21 0211 01/02/22 1159   12/26/21 0500  vancomycin (VANCOREADY) IVPB 750 mg/150 mL        750 mg 150 mL/hr over 60 Minutes Intravenous Every 24 hours 12/25/21 1145     12/25/21 0213  vancomycin variable dose per unstable renal function (pharmacist dosing)  Status:  Discontinued         Does not apply See admin instructions 12/25/21 0213 12/25/21 1145   12/25/21 0145  vancomycin (VANCOCIN) IVPB 1000 mg/200 mL premix        1,000 mg 200 mL/hr over 60 Minutes Intravenous  Once 12/25/21 0139 12/25/21 0606   12/25/21 0145  ceFEPIme (MAXIPIME) 2 g in sodium chloride 0.9 % 100 mL IVPB        2  g 200 mL/hr over 30 Minutes Intravenous  Once 12/25/21 0139 12/25/21 0549   12/25/21 0145  metroNIDAZOLE (FLAGYL) IVPB 500 mg        500 mg 100 mL/hr over 60 Minutes Intravenous Every 12 hours 12/25/21 0140           Subjective: Patient seen and examined.  She said that she is feeling better, pain is improved.  She has not had any bowel movement in 48 hours.  No other complaint.  Objective: Vitals:   12/27/21 0129 12/27/21 0520 12/27/21 0712 12/27/21 0800  BP: 106/70 121/77 117/73   Pulse: (!) 112  (!) 117 (!) 117  Resp: $Remo'15  20 20  'gvnfT$ Temp: 98.2 F (36.8 C) 98.6 F (37 C) 98.3 F (36.8 C)   TempSrc: Oral Oral  Oral   SpO2: 97%  98% 100%  Weight:      Height:        Intake/Output Summary (Last 24 hours) at 12/27/2021 0815 Last data filed at 12/26/2021 1700 Gross per 24 hour  Intake 1359.35 ml  Output --  Net 1359.35 ml    Filed Weights   12/24/21 2333  Weight: 50.3 kg    Examination:  General exam: Appears calm and comfortable  Respiratory system: Clear to auscultation. Respiratory effort normal. Cardiovascular system: S1 & S2 heard, RRR. No JVD, murmurs, rubs, gallops or clicks. No pedal edema. Gastrointestinal system: Abdomen is nondistended, soft and nontender. No organomegaly or masses felt. Normal bowel sounds heard. Central nervous system: Alert and oriented. No focal neurological deficits. Extremities: Dressing in the left foot. Psychiatry: Judgement and insight appear normal. Mood & affect appropriate.   Data Reviewed: I have personally reviewed following labs and imaging studies  CBC: Recent Labs  Lab 12/25/21 0000 12/25/21 0557 12/25/21 1038 12/26/21 0123 12/27/21 0130  WBC 38.2* 33.1* 30.2* 24.2* 18.7*  NEUTROABS  --   --  25.7* 20.2* 14.5*  HGB 12.0 10.4* 8.7* 8.5* 7.8*  HCT 36.7 31.8* 27.2* 25.3* 24.0*  MCV 97.3 99.1 98.6 95.8 96.8  PLT 461* 377 342 330 956    Basic Metabolic Panel: Recent Labs  Lab 12/25/21 0000 12/25/21 0343  12/25/21 0557 12/25/21 1038 12/26/21 0123 12/27/21 0130  NA 134*  --  134* 134* 136 139  K 5.7* 4.9 4.9 4.8 4.0 5.3*  CL 100  --  103 107 110 116*  CO2 20*  --  20* 18* 19* 19*  GLUCOSE 159*  --  127* 80 72 80  BUN 44*  --  44* 42* 30* 18  CREATININE 2.14*  --  1.97* 1.94* 1.70* 1.13*  CALCIUM 9.1  --  8.1* 7.8* 7.6* 7.2*  MG  --   --   --  2.3  --   --     GFR: Estimated Creatinine Clearance: 29.8 mL/min (A) (by C-G formula based on SCr of 1.13 mg/dL (H)). Liver Function Tests: Recent Labs  Lab 12/25/21 0000 12/25/21 0557 12/25/21 1038  AST 49* 39 37  ALT 40 31 28  ALKPHOS 65 51 50  BILITOT 0.6 0.8 0.6  PROT 7.3 5.9* 5.8*  ALBUMIN 3.0* 2.5* 2.5*    Recent Labs  Lab 12/25/21 0000  LIPASE 36    No results for input(s): "AMMONIA" in the last 168 hours. Coagulation Profile: Recent Labs  Lab 12/25/21 1038  INR 1.4*    Cardiac Enzymes: No results for input(s): "CKTOTAL", "CKMB", "CKMBINDEX", "TROPONINI" in the last 168 hours. BNP (last 3 results) No results for input(s): "PROBNP" in the last 8760 hours. HbA1C: No results for input(s): "HGBA1C" in the last 72 hours. CBG: No results for input(s): "GLUCAP" in the last 168 hours. Lipid Profile: No results for input(s): "CHOL", "HDL", "LDLCALC", "TRIG", "CHOLHDL", "LDLDIRECT" in the last 72 hours. Thyroid Function Tests: No results for input(s): "TSH", "T4TOTAL", "FREET4", "T3FREE", "THYROIDAB" in the last 72 hours. Anemia Panel: No results for input(s): "VITAMINB12", "FOLATE", "FERRITIN", "TIBC", "IRON", "RETICCTPCT" in the last 72 hours. Sepsis Labs: Recent Labs  Lab 12/25/21 0139 12/25/21 0343 12/25/21 0557 12/25/21 1159 12/26/21 0123 12/27/21 0130  PROCALCITON  --  0.72  --   --  0.50 0.27  LATICACIDVEN 2.7* 3.4* 3.2* 1.4  --   --      Recent Results (from the past 240 hour(s))  Culture,  blood (routine x 2)     Status: None (Preliminary result)   Collection Time: 12/25/21  1:15 AM   Specimen:  BLOOD LEFT HAND  Result Value Ref Range Status   Specimen Description BLOOD LEFT HAND BOTTLES DRAWN AEROBIC ONLY  Final   Special Requests   Final    Blood Culture results may not be optimal due to an inadequate volume of blood received in culture bottles   Culture   Final    NO GROWTH 2 DAYS Performed at [facility], [address] [place], [place] [address]    Report Status PENDING  Incomplete  Culture, blood (routine x 2)     Status: None (Preliminary result)   Collection Time: 12/25/21  1:30 AM   Specimen: Left Antecubital; Blood  Result Value Ref Range Status   Specimen Description   Final    LEFT ANTECUBITAL BOTTLES DRAWN AEROBIC AND ANAEROBIC   Special Requests Blood Culture adequate volume  Final   Culture  Setup Time ANAEROBIC BOTTLE ONLY NO ORGANISMS SEEN   Final   Culture   Final    NO GROWTH 2 DAYS Performed at [facility], [address] [place], [place] [address]    Report Status PENDING  Incomplete     Radiology Studies: MR FOOT LEFT WO CONTRAST  Result Date: 12/25/2021 CLINICAL DATA:  Foot swelling, nondiabetic, osteomyelitis suspected EXAM: MRI OF THE LEFT FOOT WITHOUT CONTRAST TECHNIQUE: Multiplanar, multisequence MR imaging of the left forefoot was performed. No intravenous contrast was administered. COMPARISON:  X-ray 12/25/2021 FINDINGS: Technical Note: Despite efforts by the technologist and patient, motion artifact is present on today's exam and could not be eliminated. This reduces exam sensitivity and specificity. Bones/Joint/Cartilage There is bone marrow edema throughout the distal phalanx of the great toe with patchy areas of intermediate T1 marrow signal. No focal erosion. There is mild bone marrow edema along the medial aspect of the base of the great toe proximal phalanx and within the first metatarsal head. Additionally there is mode marrow edema within the head of the second toe proximal phalanx. Preserved fatty T1 marrow signal at these locations  without marrow replacement or erosion. Relatively increased T2 signal within the third, fourth, and fifth toes as well as the fifth metatarsal head is felt to be artifactual secondary to poor fat saturation. No evidence of fracture. No dislocation. Mild degenerative changes of the forefoot, most pronounced at first and second MTP joints. No sizable joint effusion. Ligaments Intact Lisfranc ligament. No evidence of collateral ligament injury. Muscles and Tendons Chronic denervation changes of the foot musculature. Intact flexor and extensor tendons. No tenosynovitis. Soft tissues Soft tissue wound at the medial aspect of the great toe. Generalized soft tissue edema. No organized fluid collection. IMPRESSION: 1. Soft tissue wound at the medial aspect of the great toe with findings suspicious for early acute osteomyelitis of the great toe distal phalanx. 2. Mild bone marrow edema within the base of the great toe proximal phalanx, first metatarsal, and of the second toe proximal phalanx without marrow replacement or erosion. Findings are favored to reflect reactive osteitis without findings of acute osteomyelitis at this time. 3. Mild degenerative changes of the forefoot, most pronounced at first and second MTP joints. Electronically Signed   By: Davina Poke D.O.   On: 12/25/2021 10:05    Scheduled Meds:  feeding supplement  1 Container Oral TID BM   metoprolol tartrate  75 mg Oral BID   nutrition supplement (JUVEN)  1  packet Oral BID BM   pravastatin  40 mg Oral Daily   sodium zirconium cyclosilicate  10 g Oral Once   Continuous Infusions:  sodium chloride 125 mL/hr at 12/27/21 0814   ceFEPime (MAXIPIME) IV 2 g (12/26/21 1213)   metronidazole 500 mg (12/27/21 0234)   vancomycin 750 mg (12/27/21 0457)     LOS: 2 days   Darliss Cheney, MD [facility]  12/27/2021, 8:15 AM   *Please note that this is a verbal dictation therefore any spelling or grammatical errors are due to the "[place] [employer]" system interpretation.  Please page via [place] and do not message via secure chat for urgent patient care matters. Secure chat can be used for non urgent patient care matters.  How to contact the [facility] Attending or Consulting provider [place] or covering provider during after hours [place], for this patient?  Check the care team in [facility] and look for a) attending/consulting [facility] provider listed and b) the [facility] team listed. Page or secure chat [phone]. Log into [email or web address] and use [place]'s universal password to access. If you do not have the password, please contact the hospital operator. Locate the [facility] provider you are looking for under [facility] and page to a number that you can be directly reached. If you still have difficulty reaching the provider, please page the [facility] (Director on Call) for the Hospitalists listed on amion for assistance.

## 2021-12-27 NOTE — Progress Notes (Signed)
Pt continued Yellow MEWs for HR between 115-120, MD was notified and 1 time dose of Metoprolol PO was ordered.  12/27/21 1100  Assess: MEWS Score  Level of Consciousness Alert  Assess: MEWS Score  MEWS Temp 0  MEWS Systolic 0  MEWS Pulse 2  MEWS RR 0  MEWS LOC 0  MEWS Score 2  MEWS Score Color Yellow  Assess: if the MEWS score is Yellow or Red  Were vital signs taken at a resting state? Yes  Focused Assessment No change from prior assessment  Does the patient meet 2 or more of the SIRS criteria? Yes  Does the patient have a confirmed or suspected source of infection? Yes  Provider and Rapid Response Notified? No  MEWS guidelines implemented *See Row Information* No, previously yellow, continue vital signs every 4 hours (MD was notified. 1x dose of Metoprolol was given, will recheck HR.)

## 2021-12-27 NOTE — Progress Notes (Addendum)
  Progress Note    12/27/2021 9:07 AM * No surgery found *  Subjective:  some tingling and burning on dorsum of both feet this morning otherwise no recurrence of severe pain in left foot. No further diarrhea    Vitals:   12/27/21 0712 12/27/21 0800  BP: 117/73   Pulse: (!) 117 (!) 117  Resp: 20 20  Temp: 98.3 F (36.8 C)   SpO2: 98% 100%   Physical Exam: Cardiac:  irregular Lungs:  non labored Extremities:  well perfused and warm. Doppler Peroneal signal Abdomen:  soft Neurologic: alert and oriented  CBC    Component Value Date/Time   WBC 18.7 (H) 12/27/2021 0130   RBC 2.48 (L) 12/27/2021 0130   HGB 7.8 (L) 12/27/2021 0130   HCT 24.0 (L) 12/27/2021 0130   PLT 327 12/27/2021 0130   MCV 96.8 12/27/2021 0130   MCH 31.5 12/27/2021 0130   MCHC 32.5 12/27/2021 0130   RDW 14.4 12/27/2021 0130   LYMPHSABS 2.0 12/27/2021 0130   MONOABS 1.7 (H) 12/27/2021 0130   EOSABS 0.2 12/27/2021 0130   BASOSABS 0.0 12/27/2021 0130    BMET    Component Value Date/Time   NA 139 12/27/2021 0130   NA 136 (A) 08/11/2010 0000   K 5.3 (H) 12/27/2021 0130   CL 116 (H) 12/27/2021 0130   CO2 19 (L) 12/27/2021 0130   GLUCOSE 80 12/27/2021 0130   BUN 18 12/27/2021 0130   BUN 21 08/11/2010 0000   CREATININE 1.13 (H) 12/27/2021 0130   CREATININE 0.57 (L) 03/14/2015 1230   CALCIUM 7.2 (L) 12/27/2021 0130   GFRNONAA 48 (L) 12/27/2021 0130   GFRAA >60 02/20/2015 0418    INR    Component Value Date/Time   INR 1.4 (H) 12/25/2021 1038   INR 3.2 06/14/2010 0840     Intake/Output Summary (Last 24 hours) at 12/27/2021 0907 Last data filed at 12/27/2021 4163 Gross per 24 hour  Intake 1719.35 ml  Output 1000 ml  Net 719.35 ml     Assessment/Plan:  81 y.o. female admitted with sepsis, diarrhea and elevated WBC. Her foot is unchanged. MRI showing early osteomyelitis in left great toe distal phalanx.WBC are trending down 18K today. Her left leg is well perfused following recent  intervention. Continue IV Abx. Will continue to allow foot to demarcate. She will likely need at least her great toe amputated vs TMA or more proximal amputation.   Karoline Caldwell, PA-C [facility] [phone] 12/27/2021 9:07 AM  I agree with the above.  I have seen and evaluated the patient.  Her husband is present at the bedside.  I changed her dressings today.  She has dry gangrene of the right great toe as well as a large ulcer on the dorsum of her foot and a another small ulcer around the ankle.  No gross purulence or erythema is observed.  MRI does show early osteomyelitis of the distal phalanx.  I discussed with the patient and husband that had minimum she is going to require a right great toe amputation and debridement of the ulcer on her foot.  She remains at high risk for below-knee amputation.  I would like for her wounds to continue to demarcate.  She should continue antibiotics and then we will make a decision on the timing of amputation in the near future.  Annamarie Major

## 2021-12-27 NOTE — Progress Notes (Signed)
Pharmacy Antibiotic Note  [name] is a 81 y.o. female admitted on 12/24/2021 with toe osteomyelitis.  Pharmacy has been consulted for Vanc and Cefepime dosing x 7d.  9/20 Vancomycin 500mg  Q 24 hr Scr used: 1.13 mg/dL Weight: 50.3 kg Vd coeff: 0.72 L/kg Est AUC: 473  Plan: Decrease vanc 500mg  q24 hr   Cefepime 2 g q24hr Metronidazole per team  Monitor renal function, clinical status, C&S and vanc levels as needed  Height: 5\' 2"  (157.5 cm) Weight: 50.3 kg (111 lb) IBW/kg (Calculated) : 50.1  Temp (24hrs), Avg:98.5 F (36.9 C), Min:98.2 F (36.8 C), Max:98.8 F (37.1 C)  Recent Labs  Lab 12/25/21 0000 12/25/21 0139 12/25/21 0343 12/25/21 0557 12/25/21 1038 12/25/21 1159 12/26/21 0123 12/27/21 0130  WBC 38.2*  --   --  33.1* 30.2*  --  24.2* 18.7*  CREATININE 2.14*  --   --  1.97* 1.94*  --  1.70* 1.13*  LATICACIDVEN  --  2.7* 3.4* 3.2*  --  1.4  --   --      Estimated Creatinine Clearance: 29.8 mL/min (A) (by C-G formula based on SCr of 1.13 mg/dL (H)).    Allergies  Allergen Reactions   Iodine Rash and Other (See Comments)    BETADINE Rash/burning, blisters on skin. Burns skin   Iohexol Rash and Other (See Comments)    Blisters; PT NEEDS 13-HOUR PREP    Povidone-Iodine Other (See Comments)    Burns skin   Tape Itching, Rash and Other (See Comments)    "DO NOT USE ADHESIVE TAPE" Not even a Band Aid" NO PAPER TAPE Burns skin Skin is very very thin    Antimicrobials this admission: Vanc 9/18 >> 9/25 Cefepime 9/18 >> 9/25 MTZ 9/18 >>   Microbiology  9/18 Bccx ngtd   Thank you for allowing pharmacy to be a part of this patient's care.  Benetta Spar, PharmD, BCPS, BCCP Clinical Pharmacist  Please check AMION for all [place] phone numbers After 10:00 PM, call [place] [phone]

## 2021-12-28 DIAGNOSIS — A419 Sepsis, unspecified organism: Secondary | ICD-10-CM | POA: Diagnosis not present

## 2021-12-28 DIAGNOSIS — R6520 Severe sepsis without septic shock: Secondary | ICD-10-CM | POA: Diagnosis not present

## 2021-12-28 DIAGNOSIS — M79672 Pain in left foot: Secondary | ICD-10-CM | POA: Diagnosis not present

## 2021-12-28 DIAGNOSIS — M86672 Other chronic osteomyelitis, left ankle and foot: Secondary | ICD-10-CM | POA: Diagnosis not present

## 2021-12-28 LAB — BASIC METABOLIC PANEL
Anion gap: 6 (ref 5–15)
BUN: 17 mg/dL (ref 8–23)
CO2: 18 mmol/L — ABNORMAL LOW (ref 22–32)
Calcium: 7.5 mg/dL — ABNORMAL LOW (ref 8.9–10.3)
Chloride: 115 mmol/L — ABNORMAL HIGH (ref 98–111)
Creatinine, Ser: 0.89 mg/dL (ref 0.44–1.00)
GFR, Estimated: >60 mL/min (ref >60)
Glucose, Bld: 87 mg/dL (ref 70–99)
Potassium: 4.7 mmol/L (ref 3.5–5.1)
Sodium: 139 mmol/L (ref 135–145)

## 2021-12-28 LAB — CBC WITH DIFFERENTIAL/PLATELET
Abs Immature Granulocytes: 0.2 10*3/uL — ABNORMAL HIGH (ref 0.00–0.07)
Basophils Absolute: 0.1 10*3/uL (ref 0.0–0.1)
Basophils Relative: 1 %
Eosinophils Absolute: 0.2 10*3/uL (ref 0.0–0.5)
Eosinophils Relative: 2 %
HCT: 26.9 % — ABNORMAL LOW (ref 36.0–46.0)
Hemoglobin: 8.6 g/dL — ABNORMAL LOW (ref 12.0–15.0)
Immature Granulocytes: 1 %
Lymphocytes Relative: 15 %
Lymphs Abs: 2.1 10*3/uL (ref 0.7–4.0)
MCH: 31.6 pg (ref 26.0–34.0)
MCHC: 32 g/dL (ref 30.0–36.0)
MCV: 98.9 fL (ref 80.0–100.0)
Monocytes Absolute: 1.5 10*3/uL — ABNORMAL HIGH (ref 0.1–1.0)
Monocytes Relative: 10 %
Neutro Abs: 10.2 10*3/uL — ABNORMAL HIGH (ref 1.7–7.7)
Neutrophils Relative %: 71 %
Platelets: 363 10*3/uL (ref 150–400)
RBC: 2.72 MIL/uL — ABNORMAL LOW (ref 3.87–5.11)
RDW: 14.6 % (ref 11.5–15.5)
WBC: 14.2 10*3/uL — ABNORMAL HIGH (ref 4.0–10.5)
nRBC: 0 % (ref 0.0–0.2)

## 2021-12-28 MED ORDER — SODIUM CHLORIDE 0.9 % IV SOLN
2.0000 g | INTRAVENOUS | Status: DC
  Administered 2021-12-28 – 2021-12-31 (×4): 2 g via INTRAVENOUS
  Filled 2021-12-28 (×4): qty 20

## 2021-12-28 MED ORDER — SODIUM CHLORIDE 0.9 % IV SOLN: 2.0000 g | Freq: Two times a day (BID) | INTRAVENOUS | Status: DC

## 2021-12-28 MED ORDER — VANCOMYCIN HCL 1250 MG/250ML IV SOLN
1250.0000 mg | INTRAVENOUS | Status: AC
  Administered 2021-12-29 – 2021-12-31 (×2): 1250 mg via INTRAVENOUS
  Filled 2021-12-28 (×2): qty 250

## 2021-12-28 MED ORDER — DOCUSATE SODIUM 100 MG PO CAPS
100.0000 mg | ORAL_CAPSULE | Freq: Two times a day (BID) | ORAL | Status: DC
  Administered 2021-12-28 – 2021-12-30 (×5): 100 mg via ORAL
  Filled 2021-12-28 (×10): qty 1

## 2021-12-28 NOTE — Consult Note (Signed)
  Subjective:  Patient ID: [name], female    DOB: 07-26-1940,  MRN: [id number]  A 81 y.o. female with medical history significant of atrial fibrillation, anemia, hyperlipidemia, hypertension, stroke, peripheral vascular disease status post stent placement by vascular surgery now with left foot osteomyelitis of the great toe with gangrene and left dorsal lateral foot wound.  Patient states she is doing okay.  She denies any other acute complaints minimal pain.  No nausea fever chills vomiting.  Objective:   Vitals:   12/28/21 0955 12/28/21 1418  BP: (!) 143/82 127/79  Pulse: (!) 124 (!) 121  Resp: 20 18  Temp: 97.8 F (36.6 C) 98.1 F (36.7 C)  SpO2: 99% 96%   General AA&O x3. Normal mood and affect.  Vascular Dorsalis pedis and posterior tibial pulses 2/4 bilat. Brisk capillary refill to all digits. Pedal hair present.  Neurologic Epicritic sensation grossly intact.  Dermatologic Left hallux gangrene noted no redness or erythema noted no purulent drainage noted.  Left dorsal lateral foot eschar noted without any signs of infection.  Orthopedic: MMT 5/5 in dorsiflexion, plantarflexion, inversion, and eversion. Normal joint ROM without pain or crepitus.    Assessment & Plan:  Patient was evaluated and treated and all questions answered.  Left hallux gangrene with underlying osteomyelitis and left lateral eschar -All questions and concerns were discussed with the patient in extensive detail -Given the nature of bone infection involvement I believe patient will benefit from left partial first ray amputation with primary closure as well as excisional debridement of the lateral foot wound with application of Integra graft -N.p.o. after midnight -Weightbearing as tolerated with a surgical shoe -Betadine wet-to-dry dressing  Felipa Furnace, DPM  Accessible via secure chat for questions or concerns.

## 2021-12-28 NOTE — Consult Note (Signed)
[facility]    Date of Admission:  12/24/2021     Total days of antibiotics 5               Reason for Consult: Osteomyelitis  Referring Provider: Dr, Reesa Chew  Primary Care Provider: Celene Squibb, MD   ASSESSMENT:  [name] is an 81 y/o caucasian female with peripheral vascular disease s/p left angiography, angioplasty and stenting on 9/12 admitted with acute onset diarrhea worsening left foot would found to have acute osteomyelitis. Awaiting Podiatry evaluation for potential surgical intervention with Vascular Surgery indicating need for at least great toe amputation. Narrow antibiotics to vancomycin, ceftriaxone and metronidazole. Monitor renal function and vancomycin levels for therapeutic drug monitoring. Suspect diarrhea likely related to Bactrim. Appears to have chronic leukocytosis and will monitor. Discussed plan of care with husband at beside to include continued and potentially extended course of antibiotics depending upon surgical interventions and outcomes. Optimize nutrition to aid in wound healing as she remains at high risk for complicated healing and further infection.   PLAN:  Continue current dose of vancomycin and metronidazole.  Change cefepime to ceftriaxone.  Await Podiatry recommendations for surgical intervention. Remaining medical and supportive care per primary team.    Principal Problem:   Severe sepsis ([place]) Active Problems:   Hyperlipemia   Essential hypertension   Atrial fibrillation (HCC)   Protein-calorie malnutrition, moderate (HCC)   AKI (acute kidney injury) ([place])   Diarrhea   Hyperkalemia   Osteomyelitis of great toe of left foot (HCC)    docusate sodium  100 mg Oral BID   feeding supplement  1 Container Oral TID BM   metoprolol tartrate  100 mg Oral BID   metroNIDAZOLE  500 mg Oral Q12H   nutrition supplement (JUVEN)  1 packet Oral BID BM   pravastatin  40 mg Oral Daily     HPI: [name] is a 81 y.o.  female with previous medical history of breast cancers s/p mastectomy and chemotherapy (1989), atrial fibrillation on Eliquis,hypertension, CVA, and peripheral vascular disease presenting with diarrhea.  [name] presented with acute onset diarrhea with 8 episodes of watery stools. Recently started on Bactrim follow left aortogram, angioplasty and stent placement on 12/19/21. Husband was performing wound care to the left foot and per previous notes [name] was placed on it for pain and was removed causing underlying skin to be removed.  Was taking the Bactrim up to arrival to the hospital. Left foot x-ray with possible lucencies at the tufts of the distal phalanx of the first and second digits. MRI left foot with soft tissue wound at the medial aspect of the great toe with findings suspicious for early acute osteomyelitis of the great toe; and mild bone marrow edema within the base of the great toe proximal phalanx, first metatarsal, and second toe favoring reactive osteitis.  [name] was started on broad spectrum coverage with vancomycin, cefepime and metronidazole. Blood cultures have been without growth to date. Diarrhea has resolved since being in the hospital. Significant leukocytosis upon arrival which is improving. Appears to have history of elevated WBC count. Vascular Surgery following suspecting that she will at minimum require a great toe amputation and debridement and remains at high risk for below knee amputation.    Review of Systems: Review of Systems  Constitutional:  Negative for chills, fever and weight loss.  Respiratory:  Negative for cough, shortness of breath and wheezing.   Cardiovascular:  Negative for chest pain and leg swelling.  Gastrointestinal:  Negative for abdominal pain, constipation, diarrhea, nausea and vomiting.  Skin:  Negative for rash.     Past Medical History:  Diagnosis Date   Anemia 05/2014.   Atrial fibrillation ([place])    x 3 yrs   Breast cancer ([place])     S/P left mastectomy and chemotherapy 1989 remained in remission   Carotid artery occlusion    Carotid bruit    LICA 32-67% (duplex 1/24)   Degenerative arthritis of spine 2015   Chronic back pain   Hyperlipidemia    Hypertension    Meningioma ([place])    Osteoporosis    Pneumonia May 19, 2014   Stroke [facility])    CVA 2008   Subclavian steal syndrome    Ulcers of both lower extremities ([place]) 2015    Social History   Tobacco Use   Smoking status: Former    Packs/day: 1.00    Years: 56.00    Total pack years: 56.00    Types: Cigarettes    Quit date: 08/14/2006    Years since quitting: 15.3    Passive exposure: Never   Smokeless tobacco: Never   Tobacco comments:    quit in 2008  Vaping Use   Vaping Use: Never used  Substance Use Topics   Alcohol use: No    Alcohol/week: 0.0 standard drinks of alcohol   Drug use: No    Family History  Problem Relation Age of Onset   Alzheimer's disease Mother    Dementia Mother    Hypertension Mother    Hyperlipidemia Mother    Parkinsonism Father     Allergies  Allergen Reactions   Iodine Rash and Other (See Comments)    BETADINE Rash/burning, blisters on skin. Burns skin   Iohexol Rash and Other (See Comments)    Blisters; PT NEEDS 13-HOUR PREP    Povidone-Iodine Other (See Comments)    Burns skin   Tape Itching, Rash and Other (See Comments)    "DO NOT USE ADHESIVE TAPE" Not even a Band Aid" NO PAPER TAPE Burns skin Skin is very very thin    OBJECTIVE: Blood pressure (!) 143/82, pulse (!) 124, temperature 97.8 F (36.6 C), temperature source Oral, resp. rate 20, height 5\' 2"  (1.575 m), weight 50.3 kg, SpO2 99 %.  Physical Exam Constitutional:      General: She is not in acute distress.    Appearance: She is well-developed.  Cardiovascular:     Rate and Rhythm: Normal rate and regular rhythm.     Heart sounds: Normal heart sounds.  Pulmonary:     Effort: Pulmonary effort is normal.     Breath sounds: Normal  breath sounds.  Musculoskeletal:     Comments: Left great toe with gangrenous changes.   Skin:    General: Skin is warm and dry.  Neurological:     Mental Status: She is alert and oriented to person, place, and time.  Psychiatric:        Behavior: Behavior normal.        Thought Content: Thought content normal.        Judgment: Judgment normal.     Lab Results Lab Results  Component Value Date   WBC 14.2 (H) 12/28/2021   HGB 8.6 (L) 12/28/2021   HCT 26.9 (L) 12/28/2021   MCV 98.9 12/28/2021   PLT 363 12/28/2021    Lab Results  Component Value Date   CREATININE 0.89 12/28/2021  BUN 17 12/28/2021   NA 139 12/28/2021   K 4.7 12/28/2021   CL 115 (H) 12/28/2021   CO2 18 (L) 12/28/2021    Lab Results  Component Value Date   ALT 28 12/25/2021   AST 37 12/25/2021   ALKPHOS 50 12/25/2021   BILITOT 0.6 12/25/2021     Microbiology: Recent Results (from the past 240 hour(s))  Culture, blood (routine x 2)     Status: None (Preliminary result)   Collection Time: 12/25/21  1:15 AM   Specimen: BLOOD LEFT HAND  Result Value Ref Range Status   Specimen Description BLOOD LEFT HAND BOTTLES DRAWN AEROBIC ONLY  Final   Special Requests   Final    Blood Culture results may not be optimal due to an inadequate volume of blood received in culture bottles   Culture   Final    NO GROWTH 3 DAYS Performed at [facility], [address] [place], [place] [address]    Report Status PENDING  Incomplete  Culture, blood (routine x 2)     Status: None (Preliminary result)   Collection Time: 12/25/21  1:30 AM   Specimen: Left Antecubital; Blood  Result Value Ref Range Status   Specimen Description   Final    LEFT ANTECUBITAL BOTTLES DRAWN AEROBIC AND ANAEROBIC   Special Requests Blood Culture adequate volume  Final   Culture  Setup Time ANAEROBIC BOTTLE ONLY NO ORGANISMS SEEN   Final   Culture   Final    NO GROWTH 3 DAYS Performed at [facility], [address], [place], [place]  [address]    Report Status PENDING  Incomplete     Terri Piedra, NP [facility]  12/28/2021  1:16 PM

## 2021-12-28 NOTE — Progress Notes (Signed)
PROGRESS NOTE    [name]  ZOX:[id number] DOB: 08-01-1940 DOA: 12/24/2021 PCP: Celene Squibb, MD   Brief Narrative:  81 y.o. female with medical history significant of atrial fibrillation, anemia, hyperlipidemia, hypertension, stroke, peripheral vascular disease, and more presented to the ED with a chief complaint of diarrhea.  She is also reported of left foot wound with significant pain, previously started on Bactrim MRI indicated left great toe osteomyelitis, seen by vascular team.  Start broad-spectrum antibiotics.  Diarrhea appears to have resolved.   Assessment & Plan:  Principal Problem:   Severe sepsis ([place]) Active Problems:   Hyperlipemia   Essential hypertension   Atrial fibrillation (HCC)   Protein-calorie malnutrition, moderate (HCC)   AKI (acute kidney injury) ([place])   Diarrhea   Hyperkalemia   Osteomyelitis of great toe of left foot (HCC)   Severe sepsis secondary to left great toe osteomyelitis -Sepsis physiology is not improving.  Likely source at this time is the left toe osteomyelitis.  Continue to follow culture data.  Broad-spectrum antibiotics IV vancomycin/Flagyl and cefepime.  WBC appears to be improving - ID consulted.  She will need long-term antibiotics especially without amputation   Hyperkalemia; resolved -Lokelma given   Diarrhea; resolved -Appears to have resolved.  Initially C. difficile was ordered.   AKI (acute kidney injury) ([employer]); resolved -Secondary to dehydration and hypotension.  Creatinine peaked at 1.9.  Today 0.8   Protein-calorie malnutrition, moderate (HCC) -Dietitian   Paroxysmal atrial fibrillation (HCC) -Rate improved.  Metoprolol 100 mg twice daily.   History of essential hypertension but presented with severe hypotension -Antihypertensives on hold due to severe sepsis with hypotension.  IV as needed.  We will slowly resume   Hyperlipemia: - Continue pravastatin    DVT prophylaxis: SCDs Code Status: Full code Family  Communication: Husband present at bedside  Status is: Inpatient On the evaluation for osteomyelitis, continue IV antibiotics in the meantime   Nutritional status    Signs/Symptoms: moderate fat depletion, severe muscle depletion  Interventions: MVI, Boost Breeze, Juven  Body mass index is 20.3 kg/m.         Subjective: Denies any complaints.  Tells me she has not had a bowel movement in 3 days   Examination:  General exam: Elderly frail Respiratory system: Clear to auscultation. Respiratory effort normal. Cardiovascular system: S1 & S2 heard, RRR. No JVD, murmurs, rubs, gallops or clicks. No pedal edema. Gastrointestinal system: Abdomen is nondistended, soft and nontender. No organomegaly or masses felt. Normal bowel sounds heard. Central nervous system: Alert and oriented. No focal neurological deficits. Extremities: Symmetric 4 x 5 power. Skin: Left foot dressing in place Psychiatry: Judgement and insight appear normal. Mood & affect appropriate.     Objective: Vitals:   12/27/21 1949 12/28/21 0020 12/28/21 0528 12/28/21 0830  BP: 125/75 119/89 137/88 (!) 143/97  Pulse:    (!) 120  Resp: 20 17 19  (!) 24  Temp: 98.2 F (36.8 C) 98.3 F (36.8 C) 98.3 F (36.8 C) 97.8 F (36.6 C)  TempSrc: Oral Oral  Oral  SpO2: 94% 100% 97% 98%  Weight:      Height:        Intake/Output Summary (Last 24 hours) at 12/28/2021 0856 Last data filed at 12/28/2021 0526 Gross per 24 hour  Intake 3710.05 ml  Output 1800 ml  Net 1910.05 ml   Filed Weights   12/24/21 2333  Weight: 50.3 kg     Data Reviewed:   CBC: Recent Labs  Lab 12/25/21 0557 12/25/21 1038 12/26/21 0123 12/27/21 0130 12/28/21 0131  WBC 33.1* 30.2* 24.2* 18.7* 14.2*  NEUTROABS  --  25.7* 20.2* 14.5* 10.2*  HGB 10.4* 8.7* 8.5* 7.8* 8.6*  HCT 31.8* 27.2* 25.3* 24.0* 26.9*  MCV 99.1 98.6 95.8 96.8 98.9  PLT 377 342 330 327 213   Basic Metabolic Panel: Recent Labs  Lab 12/25/21 0557  12/25/21 1038 12/26/21 0123 12/27/21 0130 12/28/21 0131  NA 134* 134* 136 139 139  K 4.9 4.8 4.0 5.3* 4.7  CL 103 107 110 116* 115*  CO2 20* 18* 19* 19* 18*  GLUCOSE 127* 80 72 80 87  BUN 44* 42* 30* 18 17  CREATININE 1.97* 1.94* 1.70* 1.13* 0.89  CALCIUM 8.1* 7.8* 7.6* 7.2* 7.5*  MG  --  2.3  --   --   --    GFR: Estimated Creatinine Clearance: 37.9 mL/min (by C-G formula based on SCr of 0.89 mg/dL). Liver Function Tests: Recent Labs  Lab 12/25/21 0000 12/25/21 0557 12/25/21 1038  AST 49* 39 37  ALT 40 31 28  ALKPHOS 65 51 50  BILITOT 0.6 0.8 0.6  PROT 7.3 5.9* 5.8*  ALBUMIN 3.0* 2.5* 2.5*   Recent Labs  Lab 12/25/21 0000  LIPASE 36   No results for input(s): "AMMONIA" in the last 168 hours. Coagulation Profile: Recent Labs  Lab 12/25/21 1038  INR 1.4*   Cardiac Enzymes: No results for input(s): "CKTOTAL", "CKMB", "CKMBINDEX", "TROPONINI" in the last 168 hours. BNP (last 3 results) No results for input(s): "PROBNP" in the last 8760 hours. HbA1C: No results for input(s): "HGBA1C" in the last 72 hours. CBG: No results for input(s): "GLUCAP" in the last 168 hours. Lipid Profile: No results for input(s): "CHOL", "HDL", "LDLCALC", "TRIG", "CHOLHDL", "LDLDIRECT" in the last 72 hours. Thyroid Function Tests: No results for input(s): "TSH", "T4TOTAL", "FREET4", "T3FREE", "THYROIDAB" in the last 72 hours. Anemia Panel: No results for input(s): "VITAMINB12", "FOLATE", "FERRITIN", "TIBC", "IRON", "RETICCTPCT" in the last 72 hours. Sepsis Labs: Recent Labs  Lab 12/25/21 0139 12/25/21 0343 12/25/21 0557 12/25/21 1159 12/26/21 0123 12/27/21 0130  PROCALCITON  --  0.72  --   --  0.50 0.27  LATICACIDVEN 2.7* 3.4* 3.2* 1.4  --   --     Recent Results (from the past 240 hour(s))  Culture, blood (routine x 2)     Status: None (Preliminary result)   Collection Time: 12/25/21  1:15 AM   Specimen: BLOOD LEFT HAND  Result Value Ref Range Status   Specimen  Description BLOOD LEFT HAND BOTTLES DRAWN AEROBIC ONLY  Final   Special Requests   Final    Blood Culture results may not be optimal due to an inadequate volume of blood received in culture bottles   Culture   Final    NO GROWTH 3 DAYS Performed at [facility], [address], [place], [place] [address]    Report Status PENDING  Incomplete  Culture, blood (routine x 2)     Status: None (Preliminary result)   Collection Time: 12/25/21  1:30 AM   Specimen: Left Antecubital; Blood  Result Value Ref Range Status   Specimen Description   Final    LEFT ANTECUBITAL BOTTLES DRAWN AEROBIC AND ANAEROBIC   Special Requests Blood Culture adequate volume  Final   Culture  Setup Time ANAEROBIC BOTTLE ONLY NO ORGANISMS SEEN   Final   Culture   Final    NO GROWTH 3 DAYS Performed at [facility],  [address], [place], [place] [address]    Report Status PENDING  Incomplete         Radiology Studies: No results found.      Scheduled Meds:  feeding supplement  1 Container Oral TID BM   metoprolol tartrate  100 mg Oral BID   metroNIDAZOLE  500 mg Oral Q12H   nutrition supplement (JUVEN)  1 packet Oral BID BM   pravastatin  40 mg Oral Daily   Continuous Infusions:  sodium chloride 125 mL/hr at 12/28/21 0024   ceFEPime (MAXIPIME) IV 2 g (12/27/21 1156)   vancomycin 500 mg (12/28/21 0526)     LOS: 3 days   Time spent= 35 mins    Yesica Kemler Arsenio Loader, MD [facility]  If 7PM-7AM, please contact night-coverage  12/28/2021, 8:56 AM

## 2021-12-28 NOTE — Progress Notes (Signed)
Pharmacy Antibiotic Note  [name] is a 81 y.o. female admitted on 12/24/2021 with toe osteomyelitis.  Pharmacy has been consulted for Vanc and Cefepime dosing x 7d.  Renal function improved to baseline.   9/21 Vancomycin 1250mg  Q 48 hr Scr used: 0.89 mg/dL Weight: 50.3 kg Vd coeff: 0.72 L/kg Est AUC: 480  Plan: Increase vanc 1250mg  q48 hr   Cefepime 2 g q12hr Last doses 9/24 per consult  Metronidazole per team  Monitor renal function, clinical status, C&S and vanc levels as needed  Height: 5\' 2"  (157.5 cm) Weight: 50.3 kg (111 lb) IBW/kg (Calculated) : 50.1  Temp (24hrs), Avg:98.1 F (36.7 C), Min:97.8 F (36.6 C), Max:98.3 F (36.8 C)  Recent Labs  Lab 12/25/21 0139 12/25/21 0343 12/25/21 0557 12/25/21 1038 12/25/21 1159 12/26/21 0123 12/27/21 0130 12/28/21 0131  WBC  --   --  33.1* 30.2*  --  24.2* 18.7* 14.2*  CREATININE  --   --  1.97* 1.94*  --  1.70* 1.13* 0.89  LATICACIDVEN 2.7* 3.4* 3.2*  --  1.4  --   --   --      Estimated Creatinine Clearance: 37.9 mL/min (by C-G formula based on SCr of 0.89 mg/dL).    Allergies  Allergen Reactions   Iodine Rash and Other (See Comments)    BETADINE Rash/burning, blisters on skin. Burns skin   Iohexol Rash and Other (See Comments)    Blisters; PT NEEDS 13-HOUR PREP    Povidone-Iodine Other (See Comments)    Burns skin   Tape Itching, Rash and Other (See Comments)    "DO NOT USE ADHESIVE TAPE" Not even a Band Aid" NO PAPER TAPE Burns skin Skin is very very thin    Antimicrobials this admission: Vanc 9/18 >> 9/24 Cefepime 9/18 >> 9/24 MTZ 9/18 >>   Dose adjustments  9/20 Vanc 750mg  q24hr > 500mg  q24hr 9/21 Vanc 1250mg   q48 hr   Microbiology  9/18 Bccx ngtd   Thank you for allowing pharmacy to be a part of this patient's care.  Benetta Spar, PharmD, BCPS, BCCP Clinical Pharmacist  Please check AMION for all [place] phone numbers After 10:00 PM, call [place] [phone]

## 2021-12-28 NOTE — Progress Notes (Addendum)
  Progress Note    12/28/2021 8:17 AM * No surgery found *  Subjective:  patient sleeping soundly. Did not wake   Vitals:   12/28/21 0020 12/28/21 0528  BP: 119/89 137/88  Pulse:    Resp: 17 19  Temp: 98.3 F (36.8 C) 98.3 F (36.8 C)  SpO2: 100% 97%   Physical Exam: Cardiac: irregular Lungs:  non labored Extremities:  left foot dressed   CBC    Component Value Date/Time   WBC 14.2 (H) 12/28/2021 0131   RBC 2.72 (L) 12/28/2021 0131   HGB 8.6 (L) 12/28/2021 0131   HCT 26.9 (L) 12/28/2021 0131   PLT 363 12/28/2021 0131   MCV 98.9 12/28/2021 0131   MCH 31.6 12/28/2021 0131   MCHC 32.0 12/28/2021 0131   RDW 14.6 12/28/2021 0131   LYMPHSABS 2.1 12/28/2021 0131   MONOABS 1.5 (H) 12/28/2021 0131   EOSABS 0.2 12/28/2021 0131   BASOSABS 0.1 12/28/2021 0131    BMET    Component Value Date/Time   NA 139 12/28/2021 0131   NA 136 (A) 08/11/2010 0000   K 4.7 12/28/2021 0131   CL 115 (H) 12/28/2021 0131   CO2 18 (L) 12/28/2021 0131   GLUCOSE 87 12/28/2021 0131   BUN 17 12/28/2021 0131   BUN 21 08/11/2010 0000   CREATININE 0.89 12/28/2021 0131   CREATININE 0.57 (L) 03/14/2015 1230   CALCIUM 7.5 (L) 12/28/2021 0131   GFRNONAA >60 12/28/2021 0131   GFRAA >60 02/20/2015 0418    INR    Component Value Date/Time   INR 1.4 (H) 12/25/2021 1038   INR 3.2 06/14/2010 0840     Intake/Output Summary (Last 24 hours) at 12/28/2021 0817 Last data filed at 12/28/2021 0526 Gross per 24 hour  Intake 4070.05 ml  Output 2800 ml  Net 1270.05 ml     Assessment/Plan:  81 y.o. female admitted with sepsis, diarrhea and elevated WBC. No longer having diarrhea. MRI showing early osteomyelitis in left great toe distal phalanx. Continue Vanc and Cefepime. WBC continues to trend down 14.2 this morning. Continue daily dressing changes. Allowing patients foot to further demarcate prior to decision regarding timing and level of amputation  [facility] [phone] 12/28/2021 8:17 AM  I agree with the above.  I have seen and evaluated the patient.  No acute events.  WBC down to 14k.  I have spoken with Dr. Posey Pronto with podiatry who is going to evaluate her later today for possible toe amputation and wound debridement.  I tried to call the husband but did not get an answer.  I left a message.  Annamarie Major

## 2021-12-28 NOTE — Care Management Important Message (Signed)
Important Message  Patient Details  Name: [name] MRN: [id number] Date of Birth: [birth year]   Medicare Important Message Given:  Yes     Hannah Beat 12/28/2021, 11:23 AM

## 2021-12-29 ENCOUNTER — Inpatient Hospital Stay (HOSPITAL_COMMUNITY): Payer: Medicare HMO | Admitting: Certified Registered Nurse Anesthetist

## 2021-12-29 ENCOUNTER — Encounter (HOSPITAL_COMMUNITY)
Admission: EM | Disposition: A | Discharge status: Skilled Nursing Facility | Payer: Self-pay | Source: Home / Self Care | Attending: Internal Medicine

## 2021-12-29 ENCOUNTER — Encounter (HOSPITAL_COMMUNITY): Payer: Self-pay | Admitting: Family Medicine

## 2021-12-29 ENCOUNTER — Other Ambulatory Visit: Payer: Self-pay

## 2021-12-29 DIAGNOSIS — A419 Sepsis, unspecified organism: Secondary | ICD-10-CM | POA: Diagnosis not present

## 2021-12-29 DIAGNOSIS — I10 Essential (primary) hypertension: Secondary | ICD-10-CM

## 2021-12-29 DIAGNOSIS — Z87891 Personal history of nicotine dependence: Secondary | ICD-10-CM

## 2021-12-29 DIAGNOSIS — I2510 Atherosclerotic heart disease of native coronary artery without angina pectoris: Secondary | ICD-10-CM

## 2021-12-29 DIAGNOSIS — M869 Osteomyelitis, unspecified: Secondary | ICD-10-CM

## 2021-12-29 DIAGNOSIS — R6520 Severe sepsis without septic shock: Secondary | ICD-10-CM | POA: Diagnosis not present

## 2021-12-29 DIAGNOSIS — M86672 Other chronic osteomyelitis, left ankle and foot: Secondary | ICD-10-CM | POA: Diagnosis not present

## 2021-12-29 HISTORY — PX: AMPUTATION TOE: SHX6595

## 2021-12-29 LAB — CBC WITH DIFFERENTIAL/PLATELET
Abs Immature Granulocytes: 0.12 10*3/uL — ABNORMAL HIGH (ref 0.00–0.07)
Basophils Absolute: 0 10*3/uL (ref 0.0–0.1)
Basophils Relative: 0 %
Eosinophils Absolute: 0.2 10*3/uL (ref 0.0–0.5)
Eosinophils Relative: 2 %
HCT: 23.2 % — ABNORMAL LOW (ref 36.0–46.0)
Hemoglobin: 7.7 g/dL — ABNORMAL LOW (ref 12.0–15.0)
Immature Granulocytes: 1 %
Lymphocytes Relative: 13 %
Lymphs Abs: 1.8 10*3/uL (ref 0.7–4.0)
MCH: 32 pg (ref 26.0–34.0)
MCHC: 33.2 g/dL (ref 30.0–36.0)
MCV: 96.3 fL (ref 80.0–100.0)
Monocytes Absolute: 1.3 10*3/uL — ABNORMAL HIGH (ref 0.1–1.0)
Monocytes Relative: 10 %
Neutro Abs: 9.9 10*3/uL — ABNORMAL HIGH (ref 1.7–7.7)
Neutrophils Relative %: 74 %
Platelets: 335 10*3/uL (ref 150–400)
RBC: 2.41 MIL/uL — ABNORMAL LOW (ref 3.87–5.11)
RDW: 14.7 % (ref 11.5–15.5)
WBC: 13.3 10*3/uL — ABNORMAL HIGH (ref 4.0–10.5)
nRBC: 0 % (ref 0.0–0.2)

## 2021-12-29 LAB — BASIC METABOLIC PANEL
Anion gap: 5 (ref 5–15)
BUN: 16 mg/dL (ref 8–23)
CO2: 16 mmol/L — ABNORMAL LOW (ref 22–32)
Calcium: 6.6 mg/dL — ABNORMAL LOW (ref 8.9–10.3)
Chloride: 118 mmol/L — ABNORMAL HIGH (ref 98–111)
Creatinine, Ser: 0.71 mg/dL (ref 0.44–1.00)
GFR, Estimated: >60 mL/min (ref >60)
Glucose, Bld: 162 mg/dL — ABNORMAL HIGH (ref 70–99)
Potassium: 3.1 mmol/L — ABNORMAL LOW (ref 3.5–5.1)
Sodium: 139 mmol/L (ref 135–145)

## 2021-12-29 LAB — SURGICAL PCR SCREEN
MRSA, PCR: POSITIVE — AB
Staphylococcus aureus: POSITIVE — AB

## 2021-12-29 SURGERY — AMPUTATION, TOE
Anesthesia: General | Site: Toe | Laterality: Left

## 2021-12-29 MED ORDER — MUPIROCIN 2 % EX OINT
1.0000 | TOPICAL_OINTMENT | Freq: Two times a day (BID) | CUTANEOUS | Status: DC
  Administered 2021-12-29 – 2022-01-02 (×8): 1 via NASAL
  Filled 2021-12-29 (×2): qty 22

## 2021-12-29 MED ORDER — FENTANYL CITRATE (PF) 250 MCG/5ML IJ SOLN
INTRAMUSCULAR | Status: AC
  Filled 2021-12-29: qty 5

## 2021-12-29 MED ORDER — CHLORHEXIDINE GLUCONATE 0.12 % MT SOLN: 15.0000 mL | Freq: Once | OROMUCOSAL | Status: AC

## 2021-12-29 MED ORDER — LIDOCAINE 2% (20 MG/ML) 5 ML SYRINGE
INTRAMUSCULAR | Status: DC | PRN
  Administered 2021-12-29: 40 mg via INTRAVENOUS

## 2021-12-29 MED ORDER — IPRATROPIUM-ALBUTEROL 0.5-2.5 (3) MG/3ML IN SOLN
3.0000 mL | Freq: Once | RESPIRATORY_TRACT | Status: AC
  Administered 2021-12-29: 3 mL via RESPIRATORY_TRACT

## 2021-12-29 MED ORDER — ONDANSETRON HCL 4 MG/2ML IJ SOLN
INTRAMUSCULAR | Status: DC | PRN
  Administered 2021-12-29: 4 mg via INTRAVENOUS

## 2021-12-29 MED ORDER — POTASSIUM CHLORIDE 10 MEQ/100ML IV SOLN
10.0000 meq | INTRAVENOUS | Status: AC
  Administered 2021-12-29 (×6): 10 meq via INTRAVENOUS
  Filled 2021-12-29 (×3): qty 100

## 2021-12-29 MED ORDER — POTASSIUM CHLORIDE 20 MEQ PO PACK: 40.0000 meq | PACK | ORAL | Status: DC

## 2021-12-29 MED ORDER — POTASSIUM CHLORIDE 10 MEQ/100ML IV SOLN: 10.0000 meq | INTRAVENOUS | Status: DC

## 2021-12-29 MED ORDER — IPRATROPIUM-ALBUTEROL 0.5-2.5 (3) MG/3ML IN SOLN
RESPIRATORY_TRACT | Status: AC
  Filled 2021-12-29: qty 3

## 2021-12-29 MED ORDER — LIDOCAINE HCL 1 % IJ SOLN
INTRAMUSCULAR | Status: AC
  Filled 2021-12-29: qty 20

## 2021-12-29 MED ORDER — DEXAMETHASONE SODIUM PHOSPHATE 10 MG/ML IJ SOLN
INTRAMUSCULAR | Status: DC | PRN
  Administered 2021-12-29: 10 mg via INTRAVENOUS

## 2021-12-29 MED ORDER — FENTANYL CITRATE (PF) 250 MCG/5ML IJ SOLN
INTRAMUSCULAR | Status: DC | PRN
  Administered 2021-12-29 (×3): 25 ug via INTRAVENOUS

## 2021-12-29 MED ORDER — CHLORHEXIDINE GLUCONATE CLOTH 2 % EX PADS
6.0000 | MEDICATED_PAD | Freq: Every day | CUTANEOUS | Status: DC
  Administered 2021-12-30: 6 via TOPICAL

## 2021-12-29 MED ORDER — 0.9 % SODIUM CHLORIDE (POUR BTL) OPTIME
TOPICAL | Status: DC | PRN
  Administered 2021-12-29: 1000 mL

## 2021-12-29 MED ORDER — BUPIVACAINE HCL (PF) 0.25 % IJ SOLN
INTRAMUSCULAR | Status: AC
  Filled 2021-12-29: qty 30

## 2021-12-29 MED ORDER — CHLORHEXIDINE GLUCONATE 0.12 % MT SOLN
OROMUCOSAL | Status: AC
  Administered 2021-12-29: 15 mL via OROMUCOSAL
  Filled 2021-12-29: qty 15

## 2021-12-29 MED ORDER — PROPOFOL 10 MG/ML IV BOLUS
INTRAVENOUS | Status: DC | PRN
  Administered 2021-12-29: 100 mg via INTRAVENOUS

## 2021-12-29 MED ORDER — PHENYLEPHRINE 80 MCG/ML (10ML) SYRINGE FOR IV PUSH (FOR BLOOD PRESSURE SUPPORT)
PREFILLED_SYRINGE | INTRAVENOUS | Status: DC | PRN
  Administered 2021-12-29: 160 ug via INTRAVENOUS
  Administered 2021-12-29: 80 ug via INTRAVENOUS

## 2021-12-29 MED ORDER — PROPOFOL 10 MG/ML IV BOLUS
INTRAVENOUS | Status: AC
  Filled 2021-12-29: qty 20

## 2021-12-29 MED ORDER — LACTATED RINGERS IV SOLN: INTRAVENOUS | Status: DC

## 2021-12-29 MED ORDER — SODIUM CHLORIDE 0.45 % IV SOLN: INTRAVENOUS | Status: DC

## 2021-12-29 MED ORDER — ORAL CARE MOUTH RINSE: 15.0000 mL | Freq: Once | OROMUCOSAL | Status: AC

## 2021-12-29 SURGICAL SUPPLY — 57 items
APL PRP STRL LF DISP 70% ISPRP (MISCELLANEOUS) ×1
BAG COUNTER SPONGE SURGICOUNT (BAG) IMPLANT
BANDAGE ESMARK 6X9 LF (GAUZE/BANDAGES/DRESSINGS) IMPLANT
BLADE AVERAGE 25X9 (BLADE) IMPLANT
BLADE SURG 15 STRL LF DISP TIS (BLADE) ×2 IMPLANT
BLADE SURG 15 STRL SS (BLADE) ×1
BNDG CMPR 9X6 STRL LF SNTH (GAUZE/BANDAGES/DRESSINGS)
BNDG ELASTIC 3X5.8 VLCR STR LF (GAUZE/BANDAGES/DRESSINGS) IMPLANT
BNDG ELASTIC 4X5.8 VLCR STR LF (GAUZE/BANDAGES/DRESSINGS) ×1 IMPLANT
BNDG ESMARK 6X9 LF (GAUZE/BANDAGES/DRESSINGS)
BNDG GAUZE DERMACEA FLUFF 4 (GAUZE/BANDAGES/DRESSINGS) ×2 IMPLANT
BNDG GZE DERMACEA 4 6PLY (GAUZE/BANDAGES/DRESSINGS) ×1
CHLORAPREP W/TINT 26 (MISCELLANEOUS) ×1 IMPLANT
COVER BACK TABLE 60X90IN (DRAPES) ×1 IMPLANT
CUFF TOURN SGL QUICK 34 (TOURNIQUET CUFF)
CUFF TRNQT CYL 34X4.125X (TOURNIQUET CUFF) IMPLANT
DRAPE EXTREMITY T 121X128X90 (DISPOSABLE) ×1 IMPLANT
DRAPE IMP U-DRAPE 54X76 (DRAPES) ×1 IMPLANT
DRAPE OEC MINIVIEW 54X84 (DRAPES) IMPLANT
DRAPE SURG 17X23 STRL (DRAPES) IMPLANT
DRAPE U-SHAPE 47X51 STRL (DRAPES) ×1 IMPLANT
DRSG ADAPTIC 3X8 NADH LF (GAUZE/BANDAGES/DRESSINGS) IMPLANT
DRSG EMULSION OIL 3X3 NADH (GAUZE/BANDAGES/DRESSINGS) ×1 IMPLANT
ELECT REM PT RETURN 9FT ADLT (ELECTROSURGICAL) ×1
ELECTRODE REM PT RTRN 9FT ADLT (ELECTROSURGICAL) ×1 IMPLANT
GAUZE 4X4 16PLY ~~LOC~~+RFID DBL (SPONGE) IMPLANT
GAUZE SPONGE 4X4 12PLY STRL (GAUZE/BANDAGES/DRESSINGS) ×1 IMPLANT
GLOVE BIO SURGEON STRL SZ7 (GLOVE) ×1 IMPLANT
GLOVE BIOGEL PI IND STRL 7.5 (GLOVE) ×1 IMPLANT
GOWN STRL REUS W/ TWL LRG LVL3 (GOWN DISPOSABLE) ×1 IMPLANT
GOWN STRL REUS W/ TWL XL LVL3 (GOWN DISPOSABLE) ×1 IMPLANT
GOWN STRL REUS W/TWL LRG LVL3 (GOWN DISPOSABLE) ×1
GOWN STRL REUS W/TWL XL LVL3 (GOWN DISPOSABLE) ×1
GRAFT MYRIAD 3 LAYER 7X10 (Graft) IMPLANT
KIT BASIN OR (CUSTOM PROCEDURE TRAY) ×1 IMPLANT
NDL HYPO 25X1 1.5 SAFETY (NEEDLE) ×1 IMPLANT
NDL SAFETY ECLIPSE 18X1.5 (NEEDLE) IMPLANT
NEEDLE HYPO 18GX1.5 SHARP (NEEDLE)
NEEDLE HYPO 25X1 1.5 SAFETY (NEEDLE) ×1 IMPLANT
NS IRRIG 1000ML POUR BTL (IV SOLUTION) IMPLANT
PAD ABD 8X10 STRL (GAUZE/BANDAGES/DRESSINGS) IMPLANT
PADDING CAST ABS COTTON 4X4 ST (CAST SUPPLIES) ×2 IMPLANT
PENCIL SMOKE EVACUATOR (MISCELLANEOUS) ×1 IMPLANT
POWDER MYRIAD MORCELLS 1000MG (Miscellaneous) IMPLANT
STAPLER VISISTAT 35W (STAPLE) IMPLANT
STOCKINETTE 6  STRL (DRAPES) ×1
STOCKINETTE 6 STRL (DRAPES) ×1 IMPLANT
SUT MNCRL AB 3-0 PS2 18 (SUTURE) IMPLANT
SUT MNCRL AB 4-0 PS2 18 (SUTURE) IMPLANT
SUT MON AB 5-0 PS2 18 (SUTURE) IMPLANT
SUT PROLENE 3 0 PS 2 (SUTURE) ×1 IMPLANT
SUT PROLENE 4 0 PS 2 18 (SUTURE) IMPLANT
SWAB CULTURE LIQUID MINI MALE (MISCELLANEOUS) IMPLANT
SYR BULB EAR ULCER 3OZ GRN STR (SYRINGE) ×1 IMPLANT
SYR CONTROL 10ML LL (SYRINGE) IMPLANT
UNDERPAD 30X36 HEAVY ABSORB (UNDERPADS AND DIAPERS) ×1 IMPLANT
YANKAUER SUCT BULB TIP NO VENT (SUCTIONS) ×1 IMPLANT

## 2021-12-29 NOTE — Interval H&P Note (Signed)
History and Physical Interval Note:  12/29/2021 2:30 PM  [name]  has presented today for surgery, with the diagnosis of Osteomylitis Left Foot.  The various methods of treatment have been discussed with the patient and family. After consideration of risks, benefits and other options for treatment, the patient has consented to  Procedure(s): Incisional DEBRIDEMENT Left Foot with Amputation Left Great Toe (Left) with application of skin graft as a surgical intervention.  The patient's history has been reviewed, patient examined, no change in status, stable for surgery.  I have reviewed the patient's chart and labs.  Questions were answered to the patient's satisfaction.     Felipa Furnace

## 2021-12-29 NOTE — Transfer of Care (Signed)
Immediate Anesthesia Transfer of Care Note  Patient: [name]  Procedure(s) Performed: Incisional DEBRIDEMENT Left Foot with Amputation Left Great Toe (Left: Toe)  Patient Location: PACU  Anesthesia Type:General  Level of Consciousness: awake and alert   Airway & Oxygen Therapy: Patient Spontanous Breathing and Patient connected to nasal cannula oxygen  Post-op Assessment: Report given to RN and Post -op Vital signs reviewed and stable  Post vital signs: Reviewed and stable  Last Vitals:  Vitals Value Taken Time  BP 144/78   Temp    Pulse 98   Resp 20   SpO2 95     Last Pain:  Vitals:   12/29/21 1434  TempSrc:   PainSc: 0-No pain      Patients Stated Pain Goal: 0 (97/98/92 1194)  Complications: No notable events documented.

## 2021-12-29 NOTE — Anesthesia Preprocedure Evaluation (Signed)
Anesthesia Evaluation  Patient identified by MRN, date of birth, ID band Patient awake    Reviewed: Allergy & Precautions, NPO status , Patient's Chart, lab work & pertinent test results, reviewed documented beta blocker date and time   Airway Mallampati: II  TM Distance: >3 FB Neck ROM: Full    Dental  (+) Missing, Poor Dentition, Dental Advisory Given   Pulmonary pneumonia, resolved, COPD,  COPD inhaler, former smoker,    Pulmonary exam normal breath sounds clear to auscultation + decreased breath sounds      Cardiovascular hypertension, Pt. on medications + CAD and + Peripheral Vascular Disease  Normal cardiovascular exam Rhythm:Irregular Rate:Normal     Neuro/Psych CVA, No Residual Symptoms negative psych ROS   GI/Hepatic Neg liver ROS, PUD,   Endo/Other  negative endocrine ROS  Renal/GU Renal disease  negative genitourinary   Musculoskeletal  (+) Arthritis , Osteoarthritis,    Abdominal   Peds  Hematology Eliquis therapy - last dose 3 days ago   Anesthesia Other Findings   Reproductive/Obstetrics                             Anesthesia Physical Anesthesia Plan  ASA: 3  Anesthesia Plan: General   Post-op Pain Management: Minimal or no pain anticipated   Induction: Intravenous  PONV Risk Score and Plan: 4 or greater and Treatment may vary due to age or medical condition and Ondansetron  Airway Management Planned: LMA  Additional Equipment: None  Intra-op Plan:   Post-operative Plan: Extubation in OR  Informed Consent: I have reviewed the patients History and Physical, chart, labs and discussed the procedure including the risks, benefits and alternatives for the proposed anesthesia with the patient or authorized representative who has indicated his/her understanding and acceptance.     Dental advisory given  Plan Discussed with: Anesthesiologist and CRNA  Anesthesia Plan  Comments:         Anesthesia Quick Evaluation

## 2021-12-29 NOTE — Progress Notes (Signed)
PROGRESS NOTE    [name]  KCL:[id number] DOB: 11-Sep-1940 DOA: 12/24/2021 PCP: Celene Squibb, MD   Brief Narrative:  81 y.o. female with medical history significant of atrial fibrillation, anemia, hyperlipidemia, hypertension, stroke, peripheral vascular disease, and more presented to the ED with a chief complaint of diarrhea.  She is also reported of left foot wound with significant pain, previously started on Bactrim MRI indicated left great toe osteomyelitis, seen by vascular team.  Start broad-spectrum antibiotics.  Diarrhea appears to have resolved.  Podiatry team consulted plans on taking patient to the OR today   Assessment & Plan:  Principal Problem:   Severe sepsis ([place]) Active Problems:   Hyperlipemia   Essential hypertension   Atrial fibrillation (HCC)   Protein-calorie malnutrition, moderate (HCC)   AKI (acute kidney injury) ([place])   Diarrhea   Hyperkalemia   Osteomyelitis of great toe of left foot (HCC)   Severe sepsis secondary to left great toe osteomyelitis -Sepsis physiology is not improving.  Likely source at this time is the left toe osteomyelitis.  Continue to follow culture data.  Broad-spectrum antibiotics IV vancomycin/Flagyl and Rocephin.  Podiatry planning on OR today - ID consulted.   Hyperkalemia; resolved -Lokelma given   Diarrhea; resolved -Appears to have resolved.  Initially C. difficile was ordered.   AKI (acute kidney injury) ([place]); resolved -Secondary to dehydration and hypotension.  Creatinine peaked at 1.9.  Today 0.8   Protein-calorie malnutrition, moderate (HCC) -Dietitian   Paroxysmal atrial fibrillation (HCC) -Rate improved.  Metoprolol 100 mg twice daily.   History of essential hypertension but presented with severe hypotension -Antihypertensives on hold due to severe sepsis with hypotension.  IV as needed.  We will slowly resume   Hyperlipemia: - Continue pravastatin    DVT prophylaxis: SCDs Code Status: Full code Family  Communication: Husband present at bedside  Status is: Inpatient On the evaluation for osteomyelitis, continue IV antibiotics in the meantime   Nutritional status    Signs/Symptoms: moderate fat depletion, severe muscle depletion  Interventions: MVI, Boost Breeze, Juven  Body mass index is 20.3 kg/m.         Subjective: No complaints, waiting for OR later today  Examination: Constitutional: Not in acute distress.  Elderly frail. Respiratory: Clear to auscultation bilaterally Cardiovascular: Normal sinus rhythm, no rubs Abdomen: Nontender nondistended good bowel sounds Musculoskeletal: No edema noted Skin: Left foot dressing is in place extremity strength 4/5 Neurologic: CN 2-12 grossly intact.  And nonfocal Psychiatric: Normal judgment and insight. Alert and oriented x 3. Normal mood.     Objective: Vitals:   12/28/21 1418 12/28/21 2257 12/29/21 0438 12/29/21 0737  BP: 127/79 (!) 143/82 125/80 130/81  Pulse: (!) 121   91  Resp: 18 16 16 20   Temp: 98.1 F (36.7 C) 98.2 F (36.8 C) 97.7 F (36.5 C) 98.3 F (36.8 C)  TempSrc:  Oral Oral Tympanic  SpO2: 96% 96% 96% 93%  Weight:      Height:        Intake/Output Summary (Last 24 hours) at 12/29/2021 1326 Last data filed at 12/29/2021 0600 Gross per 24 hour  Intake 1125 ml  Output 500 ml  Net 625 ml   Filed Weights   12/24/21 2333  Weight: 50.3 kg     Data Reviewed:   CBC: Recent Labs  Lab 12/25/21 1038 12/26/21 0123 12/27/21 0130 12/28/21 0131 12/29/21 0054  WBC 30.2* 24.2* 18.7* 14.2* 13.3*  NEUTROABS 25.7* 20.2* 14.5* 10.2* 9.9*  HGB  8.7* 8.5* 7.8* 8.6* 7.7*  HCT 27.2* 25.3* 24.0* 26.9* 23.2*  MCV 98.6 95.8 96.8 98.9 96.3  PLT 342 330 327 363 383   Basic Metabolic Panel: Recent Labs  Lab 12/25/21 1038 12/26/21 0123 12/27/21 0130 12/28/21 0131 12/29/21 0054  NA 134* 136 139 139 139  K 4.8 4.0 5.3* 4.7 3.1*  CL 107 110 116* 115* 118*  CO2 18* 19* 19* 18* 16*  GLUCOSE 80 72 80  87 162*  BUN 42* 30* 18 17 16   CREATININE 1.94* 1.70* 1.13* 0.89 0.71  CALCIUM 7.8* 7.6* 7.2* 7.5* 6.6*  MG 2.3  --   --   --   --    GFR: Estimated Creatinine Clearance: 42.1 mL/min (by C-G formula based on SCr of 0.71 mg/dL). Liver Function Tests: Recent Labs  Lab 12/25/21 0000 12/25/21 0557 12/25/21 1038  AST 49* 39 37  ALT 40 31 28  ALKPHOS 65 51 50  BILITOT 0.6 0.8 0.6  PROT 7.3 5.9* 5.8*  ALBUMIN 3.0* 2.5* 2.5*   Recent Labs  Lab 12/25/21 0000  LIPASE 36   No results for input(s): "AMMONIA" in the last 168 hours. Coagulation Profile: Recent Labs  Lab 12/25/21 1038  INR 1.4*   Cardiac Enzymes: No results for input(s): "CKTOTAL", "CKMB", "CKMBINDEX", "TROPONINI" in the last 168 hours. BNP (last 3 results) No results for input(s): "PROBNP" in the last 8760 hours. HbA1C: No results for input(s): "HGBA1C" in the last 72 hours. CBG: No results for input(s): "GLUCAP" in the last 168 hours. Lipid Profile: No results for input(s): "CHOL", "HDL", "LDLCALC", "TRIG", "CHOLHDL", "LDLDIRECT" in the last 72 hours. Thyroid Function Tests: No results for input(s): "TSH", "T4TOTAL", "FREET4", "T3FREE", "THYROIDAB" in the last 72 hours. Anemia Panel: No results for input(s): "VITAMINB12", "FOLATE", "FERRITIN", "TIBC", "IRON", "RETICCTPCT" in the last 72 hours. Sepsis Labs: Recent Labs  Lab 12/25/21 0139 12/25/21 0343 12/25/21 0557 12/25/21 1159 12/26/21 0123 12/27/21 0130  PROCALCITON  --  0.72  --   --  0.50 0.27  LATICACIDVEN 2.7* 3.4* 3.2* 1.4  --   --     Recent Results (from the past 240 hour(s))  Culture, blood (routine x 2)     Status: None (Preliminary result)   Collection Time: 12/25/21  1:15 AM   Specimen: BLOOD LEFT HAND  Result Value Ref Range Status   Specimen Description BLOOD LEFT HAND BOTTLES DRAWN AEROBIC ONLY  Final   Special Requests   Final    Blood Culture results may not be optimal due to an inadequate volume of blood received in culture  bottles   Culture   Final    NO GROWTH 3 DAYS Performed at [facility], [address] [place], [place] [address]    Report Status PENDING  Incomplete  Culture, blood (routine x 2)     Status: None (Preliminary result)   Collection Time: 12/25/21  1:30 AM   Specimen: Left Antecubital; Blood  Result Value Ref Range Status   Specimen Description   Final    LEFT ANTECUBITAL BOTTLES DRAWN AEROBIC AND ANAEROBIC   Special Requests Blood Culture adequate volume  Final   Culture  Setup Time ANAEROBIC BOTTLE ONLY NO ORGANISMS SEEN   Final   Culture   Final    NO GROWTH 3 DAYS Performed at [facility], [address] [place], [place] [address]    Report Status PENDING  Incomplete         Radiology Studies: No results found.  Scheduled Meds:  docusate sodium  100 mg Oral BID   feeding supplement  1 Container Oral TID BM   metoprolol tartrate  100 mg Oral BID   metroNIDAZOLE  500 mg Oral Q12H   nutrition supplement (JUVEN)  1 packet Oral BID BM   pravastatin  40 mg Oral Daily   Continuous Infusions:  sodium chloride     cefTRIAXone (ROCEPHIN)  IV 2 g (12/28/21 2300)   potassium chloride 10 mEq (12/29/21 1240)   vancomycin 1,250 mg (12/29/21 0100)     LOS: 4 days   Time spent= 35 mins    Renee Erb Arsenio Loader, MD [facility]  If 7PM-7AM, please contact night-coverage  12/29/2021, 1:26 PM

## 2021-12-29 NOTE — Anesthesia Procedure Notes (Signed)
Procedure Name: LMA Insertion Date/Time: 12/29/2021 3:03 PM  Performed by: Lorie Phenix, CRNAPre-anesthesia Checklist: Patient identified, Emergency Drugs available, Suction available and Patient being monitored Patient Re-evaluated:Patient Re-evaluated prior to induction Oxygen Delivery Method: Circle System Utilized Preoxygenation: Pre-oxygenation with 100% oxygen Induction Type: IV induction Ventilation: Mask ventilation without difficulty LMA: LMA inserted LMA Size: 4.0 Number of attempts: 1 Placement Confirmation: positive ETCO2 Tube secured with: Tape Dental Injury: Teeth and Oropharynx as per pre-operative assessment

## 2021-12-29 NOTE — Op Note (Signed)
Surgeon: Surgeon(s): [name], [name]  Assistants: None Pre-operative diagnosis: Osteomylitis Left Foot  Post-operative diagnosis: same Procedure: Procedure(s) (LRB): Incisional DEBRIDEMENT Left Foot with Amputation Left Great Toe (Left) with application of biologic Pathology:  ID Type Source Tests Collected by Time Destination  1 : left great toe  Amputation Toe, Left SURGICAL PATHOLOGY [name], [name] 12/29/2021 1532     Pertinent Intra-op findings: Osteomyelitic changes noted to the great toe.  The lateral foot wound went down to the level of the tendons.  No further exposure of bone noted Anesthesia: Choice  Hemostasis: None EBL: 15 mL  Materials: 3-0 Prolene and application of skin graft Injectables: None Complications: None  Indications for surgery: A 81 y.o. female presents with left foot great toe osteomyelitis and left lateral foot wound. Patient has failed all conservative therapy including but not limited to local wound care and IV antibiotics. She wishes to have surgical correction of the foot/deformity. It was determined that patient would benefit from left partial first ray amputation with excisional debridement of lateral foot wound with application of biologic skin graft. Informed surgical risk consent was reviewed and read aloud to the patient.  I reviewed the films.  I have discussed my findings with the patient in great detail.  I have discussed all risks including but not limited to infection, stiffness, scarring, limp, disability, deformity, damage to blood vessels and nerves, numbness, poor healing, need for braces, arthritis, chronic pain, amputation, death.  All benefits and realistic expectations discussed in great detail.  I have made no promises as to the outcome.  I have provided realistic expectations.  I have offered the patient a 2nd opinion, which they have declined and assured me they preferred to proceed despite the risks   Procedure in detail: The  patient was both verbally and visually identified by myself, the nursing staff, and anesthesia staff in the preoperative holding area. They were then transferred to the operating room and placed on the operative table in supine position.  Attention was directed to the left great toe.  Using a fishmouth style incision is was delineated using skin marker.  Using #10 blade the incision was carried down from dermal dermal junction down to the level of the bone the toe was disarticulated at the metatarsophalangeal joint.  The toe was sent to pathology in standard technique.  At this time to avoid proper closure patient would benefit from removing first metatarsal head.  At this time using sagittal saw partial first ray amputation was carried out in standard technique.  No purulent drainage noted no signs of infection noted.  All devitalized tissue was removed.  The wound was thoroughly irrigated.  The wound was primarily closed with 3-0 Prolene and skin staples.  The wound was dressed with Kerlix Xeroform 4 x 4 gauze Ace bandage  Attention was directed to the lateral foot wound.  Using #10 blade the eschar was the roof and removed.  All devitalized tissue was used to surgically and sharply removed to bleeding healthy tissue.  At this time the wound bed was ready for grafting.  The wound was measured to be 7 cm x 4.5 cm.  It is important of the wound was at the level of the tendons.  No exposure of bone noted.  Once the wound bed was prepped the graft was applied in standard technique and secured in place with skin staples.  The left was dressed with Adaptic 4 x 4 gauze Kerlix Ace bandage.  At the conclusion of the procedure the patient was awoken from anesthesia and found to have tolerated the procedure well any complications. There were transferred to PACU with vital signs stable and vascular status intact.  [name], [name]

## 2021-12-29 NOTE — Anesthesia Postprocedure Evaluation (Signed)
Anesthesia Post Note  Patient: [name]  Procedure(s) Performed: Incisional DEBRIDEMENT Left Foot with Amputation Left Great Toe (Left: Toe)     Patient location during evaluation: PACU Anesthesia Type: General Level of consciousness: awake and alert and oriented Pain management: pain level controlled Vital Signs Assessment: post-procedure vital signs reviewed and stable Respiratory status: spontaneous breathing, nonlabored ventilation, respiratory function stable and patient connected to nasal cannula oxygen Cardiovascular status: blood pressure returned to baseline and stable Postop Assessment: no apparent nausea or vomiting Anesthetic complications: no   No notable events documented.  Last Vitals:  Vitals:   12/29/21 1545 12/29/21 1600  BP: (!) 120/96 (!) 146/75  Pulse: 96 (!) 124  Resp: (!) 24 (!) 22  Temp:    SpO2: 95% 95%    Last Pain:  Vitals:   12/29/21 1600  TempSrc:   PainSc: 0-No pain                 Cataleah Stites A.

## 2021-12-29 NOTE — Progress Notes (Signed)
  Progress Note    12/29/2021 7:44 AM * No surgery date entered *  Subjective:  no complaints. Says she is just ready for her surgery today   Vitals:   12/29/21 0438 12/29/21 0737  BP: 125/80 130/81  Pulse:  91  Resp: 16 20  Temp: 97.7 F (36.5 C) 98.3 F (36.8 C)  SpO2: 96% 93%   Physical Exam: Cardiac:  regular Lungs:  non labored Extremities:  left leg well perfused and warm Abdomen:  flat Neurologic: alert and oriented  CBC    Component Value Date/Time   WBC 13.3 (H) 12/29/2021 0054   RBC 2.41 (L) 12/29/2021 0054   HGB 7.7 (L) 12/29/2021 0054   HCT 23.2 (L) 12/29/2021 0054   PLT 335 12/29/2021 0054   MCV 96.3 12/29/2021 0054   MCH 32.0 12/29/2021 0054   MCHC 33.2 12/29/2021 0054   RDW 14.7 12/29/2021 0054   LYMPHSABS 1.8 12/29/2021 0054   MONOABS 1.3 (H) 12/29/2021 0054   EOSABS 0.2 12/29/2021 0054   BASOSABS 0.0 12/29/2021 0054    BMET    Component Value Date/Time   NA 139 12/29/2021 0054   NA 136 (A) 08/11/2010 0000   K 3.1 (L) 12/29/2021 0054   CL 118 (H) 12/29/2021 0054   CO2 16 (L) 12/29/2021 0054   GLUCOSE 162 (H) 12/29/2021 0054   BUN 16 12/29/2021 0054   BUN 21 08/11/2010 0000   CREATININE 0.71 12/29/2021 0054   CREATININE 0.57 (L) 03/14/2015 1230   CALCIUM 6.6 (L) 12/29/2021 0054   GFRNONAA >60 12/29/2021 0054   GFRAA >60 02/20/2015 0418    INR    Component Value Date/Time   INR 1.4 (H) 12/25/2021 1038   INR 3.2 06/14/2010 0840     Intake/Output Summary (Last 24 hours) at 12/29/2021 0744 Last data filed at 12/29/2021 0600 Gross per 24 hour  Intake 2294.26 ml  Output 1200 ml  Net 1094.26 ml     Assessment/Plan:  81 y.o. female admitted with sepsis and elevated WBC in setting of osteomyelitis of left great toe.  Without any pain WBC continues to trend down Continue Abx Appreciate Podiatry seeing patient To OR today with podiatry for Partial left great toe amputation and excisional debridement of left lateral foot  wound   Karoline Caldwell, PA-C [facility] [phone] 12/29/2021 7:44 AM

## 2021-12-30 ENCOUNTER — Inpatient Hospital Stay (HOSPITAL_COMMUNITY): Payer: Medicare HMO

## 2021-12-30 DIAGNOSIS — I4892 Unspecified atrial flutter: Secondary | ICD-10-CM | POA: Diagnosis not present

## 2021-12-30 DIAGNOSIS — I5033 Acute on chronic diastolic (congestive) heart failure: Secondary | ICD-10-CM | POA: Diagnosis not present

## 2021-12-30 DIAGNOSIS — I4891 Unspecified atrial fibrillation: Secondary | ICD-10-CM | POA: Diagnosis not present

## 2021-12-30 DIAGNOSIS — R6520 Severe sepsis without septic shock: Secondary | ICD-10-CM | POA: Diagnosis not present

## 2021-12-30 DIAGNOSIS — I484 Atypical atrial flutter: Secondary | ICD-10-CM

## 2021-12-30 DIAGNOSIS — A419 Sepsis, unspecified organism: Secondary | ICD-10-CM | POA: Diagnosis not present

## 2021-12-30 LAB — CBC WITH DIFFERENTIAL/PLATELET
Abs Immature Granulocytes: 0.21 10*3/uL — ABNORMAL HIGH (ref 0.00–0.07)
Basophils Absolute: 0 10*3/uL (ref 0.0–0.1)
Basophils Relative: 0 %
Eosinophils Absolute: 0 10*3/uL (ref 0.0–0.5)
Eosinophils Relative: 0 %
HCT: 27.7 % — ABNORMAL LOW (ref 36.0–46.0)
Hemoglobin: 9.1 g/dL — ABNORMAL LOW (ref 12.0–15.0)
Immature Granulocytes: 1 %
Lymphocytes Relative: 6 %
Lymphs Abs: 1 10*3/uL (ref 0.7–4.0)
MCH: 31.7 pg (ref 26.0–34.0)
MCHC: 32.9 g/dL (ref 30.0–36.0)
MCV: 96.5 fL (ref 80.0–100.0)
Monocytes Absolute: 0.9 10*3/uL (ref 0.1–1.0)
Monocytes Relative: 6 %
Neutro Abs: 14.6 10*3/uL — ABNORMAL HIGH (ref 1.7–7.7)
Neutrophils Relative %: 87 %
Platelets: 399 10*3/uL (ref 150–400)
RBC: 2.87 MIL/uL — ABNORMAL LOW (ref 3.87–5.11)
RDW: 15.3 % (ref 11.5–15.5)
WBC: 16.7 10*3/uL — ABNORMAL HIGH (ref 4.0–10.5)
nRBC: 0 % (ref 0.0–0.2)

## 2021-12-30 LAB — CULTURE, BLOOD (ROUTINE X 2)
Culture: NO GROWTH
Culture: NO GROWTH
Special Requests: ADEQUATE

## 2021-12-30 LAB — ECHOCARDIOGRAM COMPLETE
Area-P 1/2: 7.44 cm2
Calc EF: 53.4 %
Height: 62 in
P 1/2 time: 479 msec
S' Lateral: 3.4 cm
Single Plane A2C EF: 44.3 %
Single Plane A4C EF: 60.7 %
Weight: 1776 oz

## 2021-12-30 LAB — BASIC METABOLIC PANEL
Anion gap: 9 (ref 5–15)
BUN: 18 mg/dL (ref 8–23)
CO2: 16 mmol/L — ABNORMAL LOW (ref 22–32)
Calcium: 7.7 mg/dL — ABNORMAL LOW (ref 8.9–10.3)
Chloride: 113 mmol/L — ABNORMAL HIGH (ref 98–111)
Creatinine, Ser: 0.83 mg/dL (ref 0.44–1.00)
GFR, Estimated: >60 mL/min (ref >60)
Glucose, Bld: 149 mg/dL — ABNORMAL HIGH (ref 70–99)
Potassium: 4.6 mmol/L (ref 3.5–5.1)
Sodium: 138 mmol/L (ref 135–145)

## 2021-12-30 MED ORDER — APIXABAN 2.5 MG PO TABS
2.5000 mg | ORAL_TABLET | Freq: Two times a day (BID) | ORAL | Status: DC
  Administered 2021-12-30 – 2022-01-02 (×7): 2.5 mg via ORAL
  Filled 2021-12-30 (×7): qty 1

## 2021-12-30 MED ORDER — LACTULOSE 10 GM/15ML PO SOLN
30.0000 g | Freq: Three times a day (TID) | ORAL | Status: AC
  Administered 2021-12-30 (×2): 30 g via ORAL
  Filled 2021-12-30 (×3): qty 45

## 2021-12-30 MED ORDER — PERFLUTREN LIPID MICROSPHERE
1.0000 mL | INTRAVENOUS | Status: AC | PRN
  Administered 2021-12-30: 2 mL via INTRAVENOUS

## 2021-12-30 MED ORDER — DIGOXIN 0.25 MG/ML IJ SOLN
0.5000 mg | Freq: Once | INTRAMUSCULAR | Status: AC
  Administered 2021-12-30: 0.5 mg via INTRAVENOUS
  Filled 2021-12-30: qty 2

## 2021-12-30 MED ORDER — FUROSEMIDE 10 MG/ML IJ SOLN
40.0000 mg | Freq: Once | INTRAMUSCULAR | Status: AC
  Administered 2021-12-30: 40 mg via INTRAVENOUS
  Filled 2021-12-30: qty 4

## 2021-12-30 MED ORDER — SODIUM CHLORIDE 0.9 % IV SOLN: INTRAVENOUS | Status: DC

## 2021-12-30 NOTE — Evaluation (Signed)
Physical Therapy Evaluation Patient Details Name: [name] MRN: [id number] DOB: 06/28/1940 Today's Date: 12/30/2021  History of Present Illness  Pt is a 81 y.o. F who presents 12/24/2021 with severe sepsis secondary to left great toe osteomyelitis now s/p 1st toe amputation 12/29/2021. Significant PMH: atrial fibrillation, anemia, HLD, HTN, stroke, PVD.  Clinical Impression  PTA, pt lives with her spouse, requires assist for ADL's, and is a limited household ambulator. Pt reports a decline in her mobility since August. Pt presents with generalized weakness, impaired standing balance, and decreased activity tolerance. Pt requiring moderate assist for stand pivot transfers. HR 124-143 bpm. Pt preference is for home with assist from her spouse. Will continue to progress as tolerated.     Recommendations for follow up therapy are one component of a multi-disciplinary discharge planning process, led by the attending physician.  Recommendations may be updated based on patient status, additional functional criteria and insurance authorization.  Follow Up Recommendations Home health PT      Assistance Recommended at Discharge Frequent or constant Supervision/Assistance  Patient can return home with the following  A lot of help with walking and/or transfers;A lot of help with bathing/dressing/bathroom    Equipment Recommendations BSC/3in1  Recommendations for Other Services       Functional Status Assessment Patient has had a recent decline in their functional status and demonstrates the ability to make significant improvements in function in a reasonable and predictable amount of time.     Precautions / Restrictions Precautions Precautions: Fall;Other (comment) Precaution Comments: watch HR Required Braces or Orthoses: Other Brace Other Brace: L post op shoe Restrictions Weight Bearing Restrictions: Yes LLE Weight Bearing: Weight bearing as tolerated      Mobility  Bed  Mobility Overal bed mobility: Needs Assistance Bed Mobility: Supine to Sit     Supine to sit: Mod assist     General bed mobility comments: Assist for BLE to edge of bed and trunk to upright    Transfers Overall transfer level: Needs assistance Equipment used: Rolling walker (2 wheels), None Transfers: Sit to/from Stand, Bed to chair/wheelchair/BSC Sit to Stand: Mod assist, +2 safety/equipment Stand pivot transfers: Mod assist, +2 safety/equipment         General transfer comment: ModA (+2 safety) to stand from edge of bed to RW; cues for hand and foot placement. Pt with posterior and right lateral lean. Performed face to face transfer from bed to chair towards right    Ambulation/Gait                  Stairs            Wheelchair Mobility    Modified Rankin (Stroke Patients Only)       Balance Overall balance assessment: Needs assistance Sitting-balance support: Feet supported Sitting balance-Leahy Scale: Good     Standing balance support: Bilateral upper extremity supported Standing balance-Leahy Scale: Poor                               Pertinent Vitals/Pain Pain Assessment Pain Assessment: No/denies pain    Home Living Family/patient expects to be discharged to:: Private residence Living Arrangements: Spouse/significant other Available Help at Discharge: Family Type of Home: House Home Access: Stairs to enter Entrance Stairs-Rails: Psychiatric nurse of Steps: 4   Home Layout: One level Home Equipment: Transport chair;Grab bars - tub/shower;Rollator (4 wheels);Shower seat      Prior Function Prior  Level of Function : Needs assist             Mobility Comments: typically household ambulatory, however, has not ambulated much since August. Typically pushes transport chair and sits to take breaks ADLs Comments: pt husband has been assisting with shower transfers     Hand Dominance         Extremity/Trunk Assessment   Upper Extremity Assessment Upper Extremity Assessment: Defer to OT evaluation    Lower Extremity Assessment Lower Extremity Assessment: Generalized weakness    Cervical / Trunk Assessment Cervical / Trunk Assessment: Kyphotic  Communication   Communication: No difficulties  Cognition Arousal/Alertness: Awake/alert Behavior During Therapy: WFL for tasks assessed/performed Overall Cognitive Status: Within Functional Limits for tasks assessed                                          General Comments      Exercises     Assessment/Plan    PT Assessment Patient needs continued PT services  PT Problem List Decreased strength;Decreased activity tolerance;Decreased balance;Decreased mobility       PT Treatment Interventions DME instruction;Gait training;Stair training;Functional mobility training;Therapeutic activities;Therapeutic exercise;Balance training;Patient/family education    PT Goals (Current goals can be found in the Care Plan section)  Acute Rehab PT Goals Patient Stated Goal: to go home PT Goal Formulation: With patient/family Time For Goal Achievement: 01/13/22 Potential to Achieve Goals: Good    Frequency Min 3X/week     Co-evaluation               AM-PAC PT "6 Clicks" Mobility  Outcome Measure Help needed turning from your back to your side while in a flat bed without using bedrails?: A Little Help needed moving from lying on your back to sitting on the side of a flat bed without using bedrails?: A Lot Help needed moving to and from a bed to a chair (including a wheelchair)?: A Lot Help needed standing up from a chair using your arms (e.g., wheelchair or bedside chair)?: A Lot Help needed to walk in hospital room?: Total Help needed climbing 3-5 steps with a railing? : Total 6 Click Score: 11    End of Session Equipment Utilized During Treatment: Gait belt Activity Tolerance: Patient tolerated  treatment well Patient left: in chair;with call bell/phone within reach;with chair alarm set Nurse Communication: Mobility status PT Visit Diagnosis: Unsteadiness on feet (R26.81);Muscle weakness (generalized) (M62.81);Difficulty in walking, not elsewhere classified (R26.2)    Time: 3846-6599 PT Time Calculation (min) (ACUTE ONLY): 23 min   Charges:   PT Evaluation $PT Eval Moderate Complexity: 1 Mod PT Treatments $Therapeutic Activity: 8-22 mins        Wyona Almas, PT, DPT Acute Rehabilitation Services Office [phone]   Deno Etienne 12/30/2021, 11:15 AM

## 2021-12-30 NOTE — Progress Notes (Signed)
  Subjective:  Patient ID: [name], female    DOB: 1940/10/21,  MRN: [id number]  A 81 y.o. female presents with left foot great toe osteomyelitis with left lateral foot wound with eschar covering now status post postop day 1 left partial first ray amputation with excisional debridement of wound with application of skin graft.  Patient states that she is doing okay.  No nausea fever chills vomiting the foot feels great.  No acute complaints from left foot. Objective:   Vitals:   12/30/21 0811 12/30/21 0832  BP: 127/73 126/74  Pulse: 60 (!) 122  Resp: 15 (!) 23  Temp: 98.4 F (36.9 C)   SpO2: 95% 96%   General AA&O x3. Normal mood and affect.  Vascular Dorsalis pedis and posterior tibial pulses 2/4 bilat. Brisk capillary refill to all digits. Pedal hair present.  Neurologic Epicritic sensation grossly intact.  Dermatologic No open lesions. Interspaces clear of maceration. Nails well groomed and normal in appearance.  Orthopedic: MMT 5/5 in dorsiflexion, plantarflexion, inversion, and eversion. Normal joint ROM without pain or crepitus.    Assessment & Plan:  Patient was evaluated and treated and all questions answered.  Left foot ulceration and left foot wound status post partial first ray amputation with debridement of the wound with application of skin graft -Questions and concerns were discussed with the patient in extensive detail -Patient is okay to be discharged from podiatric standpoint. -10 days of doxycycline. -No dressing change until follow-up -Follow-up with me 1 week from discharge -Weightbearing as tolerated with surgical shoe  Felipa Furnace, DPM  Accessible via secure chat for questions or concerns.

## 2021-12-30 NOTE — Progress Notes (Addendum)
PROGRESS NOTE    [name]  GYK:[id number] DOB: Aug 25, 1940 DOA: 12/24/2021 PCP: Celene Squibb, MD   Brief Narrative:  81 y.o. female with medical history significant of atrial fibrillation, anemia, hyperlipidemia, hypertension, stroke, peripheral vascular disease, and more presented to the ED with a chief complaint of diarrhea.  She is also reported of left foot wound with significant pain, previously started on Bactrim MRI indicated left great toe osteomyelitis, seen by vascular team.  Start broad-spectrum antibiotics.  Diarrhea appears to have resolved.  Podiatry to go to the OR on 9/22 for incisional debridement amputation of the left great toe.   Assessment & Plan:  Principal Problem:   Severe sepsis ([place]) Active Problems:   Hyperlipemia   Essential hypertension   Atrial fibrillation (HCC)   Protein-calorie malnutrition, moderate (HCC)   AKI (acute kidney injury) ([place])   Diarrhea   Hyperkalemia   Osteomyelitis of great toe of left foot (HCC)   Severe sepsis secondary to left great toe osteomyelitis Status post debridement and amputation of left great toe 9/22. -Patient still remains tachycardic.  Status post amputation.  Continue to follow culture data.  Currently on IV vancomycin/Rocephin and Flagyl.  ID following. - ID consulted.   Hyperkalemia; resolved -Lokelma given  Paroxysmal atrial fibrillation ([place]) - Uncontrolled rates this morning.  On metoprolol 100 mg twice daily.  We will give 1 dose of IV digoxin.  We will consult cardiology, follows with outpatient Dr. Admission.    Diarrhea; resolved -Appears to have resolved.  Initially C. difficile was ordered.   AKI (acute kidney injury) ([place]); resolved -Secondary to dehydration and hypotension.  Creatinine peaked at 1.9.  Today 0.8   Protein-calorie malnutrition, moderate (HCC) -Dietitian   Constipation Lactulose and prn meds ordered.     History of essential hypertension but presented with severe  hypotension -On Metoprolol 100mg  bid   Hyperlipemia: - Continue pravastatin   DVT prophylaxis: SCDs Code Status: Full code Family Communication: Husband is present at bedside  Status is: Inpatient Ongoing management of underlying atrial fibrillation with RVR.  ID following to eventually recommend antibiotics  Nutritional status    Signs/Symptoms: moderate fat depletion, severe muscle depletion  Interventions: MVI, Boost Breeze, Juven  Body mass index is 20.3 kg/m.         Subjective:  Patient seen and examined at this morning.  She was sitting up in the recliner with the help of the nurse and her heart rate was in 130s irregular.  It remained elevated even after she remained calm.  Did not really help much complaints.  Examination: Constitutional: Not in acute distress Respiratory: Clear to auscultation bilaterally Cardiovascular: Irregularly irregular heart rate in 130s. Abdomen: Nontender nondistended good bowel sounds Musculoskeletal: No edema noted Skin: Left lower extremity dressing in place Neurologic: CN 2-12 grossly intact.  And nonfocal Psychiatric: Normal judgment and insight. Alert and oriented x 3. Normal mood.  Objective: Vitals:   12/30/21 0000 12/30/21 0100 12/30/21 0500 12/30/21 0832  BP: (!) 141/82 123/82 113/87 126/74  Pulse: 99 (!) 117 (!) 120 (!) 122  Resp: 19 16 20  (!) 23  Temp:   98.2 F (36.8 C)   TempSrc:      SpO2: 99% 97% 98% 96%  Weight:      Height:        Intake/Output Summary (Last 24 hours) at 12/30/2021 0832 Last data filed at 12/29/2021 1656 Gross per 24 hour  Intake 300 ml  Output 715 ml  Net -415  ml   Filed Weights   12/24/21 2333  Weight: 50.3 kg     Data Reviewed:   CBC: Recent Labs  Lab 12/26/21 0123 12/27/21 0130 12/28/21 0131 12/29/21 0054 12/30/21 0618  WBC 24.2* 18.7* 14.2* 13.3* 16.7*  NEUTROABS 20.2* 14.5* 10.2* 9.9* 14.6*  HGB 8.5* 7.8* 8.6* 7.7* 9.1*  HCT 25.3* 24.0* 26.9* 23.2* 27.7*   MCV 95.8 96.8 98.9 96.3 96.5  PLT 330 327 363 335 326   Basic Metabolic Panel: Recent Labs  Lab 12/25/21 1038 12/26/21 0123 12/27/21 0130 12/28/21 0131 12/29/21 0054 12/30/21 0618  NA 134* 136 139 139 139 138  K 4.8 4.0 5.3* 4.7 3.1* 4.6  CL 107 110 116* 115* 118* 113*  CO2 18* 19* 19* 18* 16* 16*  GLUCOSE 80 72 80 87 162* 149*  BUN 42* 30* 18 17 16 18   CREATININE 1.94* 1.70* 1.13* 0.89 0.71 0.83  CALCIUM 7.8* 7.6* 7.2* 7.5* 6.6* 7.7*  MG 2.3  --   --   --   --   --    GFR: Estimated Creatinine Clearance: 40.6 mL/min (by C-G formula based on SCr of 0.83 mg/dL). Liver Function Tests: Recent Labs  Lab 12/25/21 0000 12/25/21 0557 12/25/21 1038  AST 49* 39 37  ALT 40 31 28  ALKPHOS 65 51 50  BILITOT 0.6 0.8 0.6  PROT 7.3 5.9* 5.8*  ALBUMIN 3.0* 2.5* 2.5*   Recent Labs  Lab 12/25/21 0000  LIPASE 36   No results for input(s): "AMMONIA" in the last 168 hours. Coagulation Profile: Recent Labs  Lab 12/25/21 1038  INR 1.4*   Cardiac Enzymes: No results for input(s): "CKTOTAL", "CKMB", "CKMBINDEX", "TROPONINI" in the last 168 hours. BNP (last 3 results) No results for input(s): "PROBNP" in the last 8760 hours. HbA1C: No results for input(s): "HGBA1C" in the last 72 hours. CBG: No results for input(s): "GLUCAP" in the last 168 hours. Lipid Profile: No results for input(s): "CHOL", "HDL", "LDLCALC", "TRIG", "CHOLHDL", "LDLDIRECT" in the last 72 hours. Thyroid Function Tests: No results for input(s): "TSH", "T4TOTAL", "FREET4", "T3FREE", "THYROIDAB" in the last 72 hours. Anemia Panel: No results for input(s): "VITAMINB12", "FOLATE", "FERRITIN", "TIBC", "IRON", "RETICCTPCT" in the last 72 hours. Sepsis Labs: Recent Labs  Lab 12/25/21 0139 12/25/21 0343 12/25/21 0557 12/25/21 1159 12/26/21 0123 12/27/21 0130  PROCALCITON  --  0.72  --   --  0.50 0.27  LATICACIDVEN 2.7* 3.4* 3.2* 1.4  --   --     Recent Results (from the past 240 hour(s))  Culture, blood  (routine x 2)     Status: None   Collection Time: 12/25/21  1:15 AM   Specimen: BLOOD LEFT HAND  Result Value Ref Range Status   Specimen Description BLOOD LEFT HAND BOTTLES DRAWN AEROBIC ONLY  Final   Special Requests   Final    Blood Culture results may not be optimal due to an inadequate volume of blood received in culture bottles   Culture   Final    NO GROWTH 5 DAYS Performed at [facility], [address] [place], [place] [address]    Report Status 12/30/2021 FINAL  Final  Culture, blood (routine x 2)     Status: None   Collection Time: 12/25/21  1:30 AM   Specimen: Left Antecubital; Blood  Result Value Ref Range Status   Specimen Description   Final    LEFT ANTECUBITAL BOTTLES DRAWN AEROBIC AND ANAEROBIC   Special Requests Blood Culture adequate volume  Final  Culture  Setup Time ANAEROBIC BOTTLE ONLY NO ORGANISMS SEEN   Final   Culture   Final    NO GROWTH 5 DAYS Performed at [facility], [address], [place], [place] [address]    Report Status 12/30/2021 FINAL  Final  Surgical pcr screen     Status: Abnormal   Collection Time: 12/29/21 12:14 PM   Specimen: Nasal Mucosa; Nasal Swab  Result Value Ref Range Status   MRSA, PCR POSITIVE (A) NEGATIVE Final    Comment: RESULT CALLED TO, READ BACK BY AND VERIFIED WITH: 12/29/21 1430 RN KIANG    Staphylococcus aureus POSITIVE (A) NEGATIVE Final    Comment: (NOTE) The Xpert SA Assay (FDA approved for NASAL specimens in patients 54 years of age and older), is one component of a comprehensive surveillance program. It is not intended to diagnose infection nor to guide or monitor treatment. Performed at [facility], [place] [address], [place], [place] [address]          Radiology Studies: No results found.      Scheduled Meds:  Chlorhexidine Gluconate Cloth  6 each Topical Q0600   docusate sodium  100 mg Oral BID   feeding supplement  1 Container Oral TID BM   metoprolol tartrate  100 mg Oral BID    metroNIDAZOLE  500 mg Oral Q12H   mupirocin ointment  1 Application Nasal BID   nutrition supplement (JUVEN)  1 packet Oral BID BM   pravastatin  40 mg Oral Daily   Continuous Infusions:  sodium chloride 75 mL/hr at 12/30/21 0823   cefTRIAXone (ROCEPHIN)  IV 2 g (12/29/21 2303)   vancomycin 1,250 mg (12/29/21 0100)     LOS: 5 days   Time spent= 35 mins    Jazari Ober Arsenio Loader, MD [facility]  If 7PM-7AM, please contact night-coverage  12/30/2021, 8:32 AM

## 2021-12-30 NOTE — Consult Note (Signed)
Cardiology Consultation   Patient ID: [name] MRN: [id number]; DOB: [birth year]  Admit date: 12/24/2021 Date of Consult: 12/30/2021  PCP:  Celene Squibb, MD   [facility] Cardiologist:  Jenkins Rouge, MD        Patient Profile:   AYAUNA [name] is a 81 y.o. female with a hx of atrial fibrillation, CVA, PAD, hypertension, hyperlipidemia who is being seen 12/30/2021 for the evaluation of atrial fibrillation at the request of Dr. Reesa Chew.  History of Present Illness:   Ms. [name] 81 year old female with the above medical history who was admitted on 12/25/2021 with osteomyelitis of left foot.  She initially presented to the ED with diarrhea and also reported painful foot wound.  She had undergone stent to left SFA and angioplasty to left popliteal artery and left peroneal artery on 12/19/2021 with vascular surgery.  She was found to be septic secondary to left great toe osteomyelitis.  Podiatry was consulted and she underwent partial left great toe amputation and excisional debridement of foot wound on 9/22.  She had an AKI due to dehydration and hypotension, which has resolved with IV fluids.  EKG shows 2:1 atrial flutter, rate 123.  She has a history of paroxysmal atrial fibrillation and is on Eliquis 2.5 mg twice daily at home, as well as metoprolol 75 mg twice daily.  Most recent BP 126/74, pulse 122.  Labs from today notable for creatinine 0.8, hemoglobin 9.1, WBC 16.7.  She currently reports feeling short of breath.   Past Medical History:  Diagnosis Date   Anemia 05/2014.   Atrial fibrillation ([place])    x 3 yrs   Breast cancer ([place])    S/P left mastectomy and chemotherapy 1989 remained in remission   Carotid artery occlusion    Carotid bruit    LICA 00-17% (duplex 4/94)   Degenerative arthritis of spine 2015   Chronic back pain   Hyperlipidemia    Hypertension    Meningioma ([place])    Osteoporosis    Pneumonia May 19, 2014   Stroke [facility])    CVA 2008    Subclavian steal syndrome    Ulcers of both lower extremities ([place]) 2015    Past Surgical History:  Procedure Laterality Date   ABDOMINAL AORTOGRAM W/LOWER EXTREMITY N/A 12/19/2021   Procedure: ABDOMINAL AORTOGRAM W/LOWER EXTREMITY;  Surgeon: Serafina Mitchell, MD;  Location: [place] [facility];  Service: Cardiovascular;  Laterality: N/A;   CARDIAC CATHETERIZATION     CATARACT EXTRACTION Bilateral 2013   COLONOSCOPY  11/17/2010   Procedure: COLONOSCOPY;  Surgeon: [name], MD;  [facility];  Service: Endoscopy;  Laterality: N/A;  10:45 am   ESOPHAGOGASTRODUODENOSCOPY  11/17/2010   Procedure: ESOPHAGOGASTRODUODENOSCOPY (EGD);  Surgeon: [name], MD;  [facility];  Service: Endoscopy;  Laterality: N/A;   ESOPHAGOGASTRODUODENOSCOPY N/A 02/16/2015   Procedure: ESOPHAGOGASTRODUODENOSCOPY (EGD);  Surgeon: [name], MD;  Location: [place];  Service: Gastroenterology;  Laterality: N/A;   EXPLORATORY LAPAROTOMY  1960s   For peritonitis of undetermined cause   EYE SURGERY     LUMBAR EPIDURAL INJECTION  06-2012--06-2013   pt. states she has had 5 epidurals in 06-2012----06-2013   MASTECTOMY Left 1990   PERIPHERAL VASCULAR INTERVENTION Left 12/19/2021   Procedure: PERIPHERAL VASCULAR INTERVENTION;  Surgeon: Serafina Mitchell, MD;  Location: [place] [facility];  Service: Cardiovascular;  Laterality: Left;  SFA and PTA of POP   YAG LASER APPLICATION Right  05/03/2015   Procedure: YAG LASER APPLICATION;  Surgeon: [name], MD;  Location: [facility];  Service: Ophthalmology;  Laterality: Right;  right   YAG LASER APPLICATION Left 11/01/3662   Procedure: YAG LASER APPLICATION;  Surgeon: [name], MD;  Location: [facility];  Service: Ophthalmology;  Laterality: Left;    Inpatient Medications: Scheduled Meds:  Chlorhexidine Gluconate Cloth  6 each Topical Q0600   docusate sodium  100 mg Oral BID   feeding supplement  1 Container Oral TID BM   lactulose   30 g Oral TID   metoprolol tartrate  100 mg Oral BID   metroNIDAZOLE  500 mg Oral Q12H   mupirocin ointment  1 Application Nasal BID   nutrition supplement (JUVEN)  1 packet Oral BID BM   pravastatin  40 mg Oral Daily   Continuous Infusions:  cefTRIAXone (ROCEPHIN)  IV 2 g (12/29/21 2303)   vancomycin 1,250 mg (12/29/21 0100)   PRN Meds: acetaminophen **OR** acetaminophen, albuterol, ondansetron **OR** ondansetron (ZOFRAN) IV, oxyCODONE  Allergies:    Allergies  Allergen Reactions   Iodine Rash and Other (See Comments)    BETADINE Rash/burning, blisters on skin. Burns skin   Iohexol Rash and Other (See Comments)    Blisters; PT NEEDS 13-HOUR PREP    Povidone-Iodine Other (See Comments)    Burns skin   Tape Itching, Rash and Other (See Comments)    "DO NOT USE ADHESIVE TAPE" Not even a Band Aid" NO PAPER TAPE Burns skin Skin is very very thin    Social History:   Social History   Socioeconomic History   Marital status: Married    Spouse name: Not on file   Number of children: 2   Years of education: Not on file   Highest education level: Not on file  Occupational History   Occupation: Retired    Fish farm manager: RETIRED  Tobacco Use   Smoking status: Former    Packs/day: 1.00    Years: 56.00    Total pack years: 56.00    Types: Cigarettes    Quit date: 08/14/2006    Years since quitting: 15.3    Passive exposure: Never   Smokeless tobacco: Never   Tobacco comments:    quit in 2008  Vaping Use   Vaping Use: Never used  Substance and Sexual Activity   Alcohol use: No    Alcohol/week: 0.0 standard drinks of alcohol   Drug use: No   Sexual activity: Never  Other Topics Concern   Not on file  Social History Narrative   Not on file   Social Determinants of Health   Financial Resource Strain: Not on file  Food Insecurity: No Food Insecurity (12/26/2021)   Hunger Vital Sign    Worried About Running Out of Food in the Last Year: Never true    [place] in  the Last Year: Never true  Transportation Needs: No Transportation Needs (12/26/2021)   PRAPARE - Hydrologist (Medical): No    Lack of Transportation (Non-Medical): No  Physical Activity: Not on file  Stress: Not on file  Social Connections: Not on file  Intimate Partner Violence: Not At Risk (12/26/2021)   Humiliation, Afraid, Rape, and Kick questionnaire    Fear of Current or Ex-Partner: No    Emotionally Abused: No    Physically Abused: No    Sexually Abused: No    Family History:    Family History  Problem Relation Age of  Onset   Alzheimer's disease Mother    Dementia Mother    Hypertension Mother    Hyperlipidemia Mother    Parkinsonism Father      ROS:  Please see the history of present illness.   All other ROS reviewed and negative.     Physical Exam/Data:   Vitals:   12/30/21 0100 12/30/21 0500 12/30/21 0811 12/30/21 0832  BP: 123/82 113/87 127/73 126/74  Pulse: (!) 117 (!) 120 60 (!) 122  Resp: 16 20 15  (!) 23  Temp:  98.2 F (36.8 C) 98.4 F (36.9 C)   TempSrc:   Oral   SpO2: 97% 98% 95% 96%  Weight:      Height:        Intake/Output Summary (Last 24 hours) at 12/30/2021 1233 Last data filed at 12/30/2021 6503 Gross per 24 hour  Intake 550 ml  Output 715 ml  Net -165 ml      12/24/2021   11:33 PM 12/19/2021    9:24 AM 10/25/2021   10:01 AM  Last 3 Weights  Weight (lbs) 111 lb 105 lb 112 lb 12.8 oz  Weight (kg) 50.349 kg 47.628 kg 51.166 kg     Body mass index is 20.3 kg/m.  General:  in no acute distress HEENT: normal Neck: +JVD Cardiac: Tachycardic, regular no murmur  Lungs: Expiratory wheezing Abd: soft, nontender Ext: 1+ edema Musculoskeletal:  No deformities, Skin: warm and dry  Neuro:  no focal abnormalities noted Psych:  Normal affect   EKG:  The EKG was personally reviewed and demonstrates:  EKG shows 2:1 atrial flutter, rate 123 Telemetry:  Telemetry was personally reviewed and demonstrates:  Previously atrial fibrillation, now atrial flutter with variable conduction.  Rates as low as 80s but often in 2-1 A-flutter and 120s  Relevant CV Studies:   Laboratory Data:  High Sensitivity Troponin:  No results for input(s): "TROPONINIHS" in the last 720 hours.   Chemistry Recent Labs  Lab 12/25/21 1038 12/26/21 0123 12/28/21 0131 12/29/21 0054 12/30/21 0618  NA 134*   < > 139 139 138  K 4.8   < > 4.7 3.1* 4.6  CL 107   < > 115* 118* 113*  CO2 18*   < > 18* 16* 16*  GLUCOSE 80   < > 87 162* 149*  BUN 42*   < > 17 16 18   CREATININE 1.94*   < > 0.89 0.71 0.83  CALCIUM 7.8*   < > 7.5* 6.6* 7.7*  MG 2.3  --   --   --   --   GFRNONAA 25*   < > >60 >60 >60  ANIONGAP 9   < > 6 5 9    < > = values in this interval not displayed.    Recent Labs  Lab 12/25/21 0000 12/25/21 0557 12/25/21 1038  PROT 7.3 5.9* 5.8*  ALBUMIN 3.0* 2.5* 2.5*  AST 49* 39 37  ALT 40 31 28  ALKPHOS 65 51 50  BILITOT 0.6 0.8 0.6   Lipids No results for input(s): "CHOL", "TRIG", "HDL", "LABVLDL", "LDLCALC", "CHOLHDL" in the last 168 hours.  Hematology Recent Labs  Lab 12/28/21 0131 12/29/21 0054 12/30/21 0618  WBC 14.2* 13.3* 16.7*  RBC 2.72* 2.41* 2.87*  HGB 8.6* 7.7* 9.1*  HCT 26.9* 23.2* 27.7*  MCV 98.9 96.3 96.5  MCH 31.6 32.0 31.7  MCHC 32.0 33.2 32.9  RDW 14.6 14.7 15.3  PLT 363 335 399   Thyroid No results for input(s): "TSH", "  FREET4" in the last 168 hours.  BNPNo results for input(s): "BNP", "PROBNP" in the last 168 hours.  DDimer No results for input(s): "DDIMER" in the last 168 hours.   Radiology/Studies:  No results found.   Assessment and Plan:   Atrial fibrillation/flutter: Has history of paroxysmal atrial fibrillation.  Current episode likely driven by acute illness, has presented with sepsis due to left great toe osteomyelitis.  She is status post amputation.  Had been in atrial fibrillation but now is in aflutter with variable conduction -Continue metoprolol 100 mg  twice daily.  Aflutter can be difficult to rate control, may ultimately require cardioversion as she recovers from her acute illness.  Would restart Eliquis once OK from surgical standpoint -Echocardiogram  Acute on chronic diastolic heart failure: Presented with hypovolemia in setting of diarrhea.  Had been getting IV fluids.  Now appears volume overloaded.  Will give IV Lasix 40 mg today  Sepsis: Secondary to osteomyelitis in left great toe, status post amputation 9/22   For questions or updates, please contact [place] Please consult [email or web address] for contact info under    Signed, Donato Heinz, MD  12/30/2021 12:33 PM

## 2021-12-30 NOTE — Evaluation (Signed)
Occupational Therapy Evaluation Patient Details Name: [name] MRN: [id number] DOB: 1940/04/10 Today's Date: 12/30/2021   History of Present Illness Pt is a 81 y.o. F who presents 12/24/2021 with severe sepsis secondary to left great toe osteomyelitis now s/p 1st toe amputation 12/29/2021. Significant PMH: atrial fibrillation, anemia, HLD, HTN, stroke, PVD.   Clinical Impression   Pt presents with decline in function and safety with ADLs and ADL mobility to maximize level of function and safety. PTA pt lived at home with her husband and recently required assist with LB ADLs, used transport chair for mobility around the house. Pt currently requires min guard A with UB ADLs and grooming, max - total A with LB ADLs, max/mod A sit - stand/SPTs and total A with toileting. Pt and husband insrtucted on B UE exercises (chair push ups) to increase strength to faciliate sit - stand, transfers; pt able to return demo with min A. Pt would benefit from acute OT services to address impairments to maximize level of function and safety     Recommendations for follow up therapy are one component of a multi-disciplinary discharge planning process, led by the attending physician.  Recommendations may be updated based on patient status, additional functional criteria and insurance authorization.   Follow Up Recommendations  Home health OT    Assistance Recommended at Discharge Frequent or constant Supervision/Assistance  Patient can return home with the following A lot of help with bathing/dressing/bathroom;A lot of help with walking and/or transfers    Functional Status Assessment  Patient has had a recent decline in their functional status and demonstrates the ability to make significant improvements in function in a reasonable and predictable amount of time.  Equipment Recommendations  BSC/3in1    Recommendations for Other Services       Precautions / Restrictions Precautions Precautions: Fall;Other  (comment) Precaution Comments: watch HR Required Braces or Orthoses: Other Brace Other Brace: L post op shoe Restrictions Weight Bearing Restrictions: Yes LLE Weight Bearing: Weight bearing as tolerated      Mobility Bed Mobility               General bed mobility comments: pt in recliner upon arrival    Transfers Overall transfer level: Needs assistance Equipment used: Rolling walker (2 wheels), None Transfers: Sit to/from Stand Sit to Stand: Max assist, Mod assist Stand pivot transfers: Max assist, Mod assist         General transfer comment: min verbal cues for correct hand/foot placement      Balance                                           ADL either performed or assessed with clinical judgement   ADL Overall ADL's : Needs assistance/impaired Eating/Feeding: Independent;Sitting   Grooming: Wash/dry hands;Wash/dry face;Min guard;Sitting   Upper Body Bathing: Min guard;Sitting   Lower Body Bathing: Maximal assistance   Upper Body Dressing : Min guard;Sitting   Lower Body Dressing: Total assistance   Toilet Transfer: Maximal assistance;Stand-pivot;BSC/3in1;Cueing for safety;Cueing for sequencing   Toileting- Clothing Manipulation and Hygiene: Total assistance       Functional mobility during ADLs: Maximal assistance;Moderate assistance;Cueing for safety;Cueing for sequencing       Vision Baseline Vision/History: 1 Wears glasses Patient Visual Report: No change from baseline       Perception     Praxis  Pertinent Vitals/Pain Pain Assessment Pain Assessment: Faces Faces Pain Scale: Hurts a little bit Pain Location: L foot Pain Descriptors / Indicators: Grimacing Pain Intervention(s): Monitored during session, Limited activity within patient's tolerance, Premedicated before session, Repositioned     Hand Dominance Right   Extremity/Trunk Assessment Upper Extremity Assessment Upper Extremity Assessment:  Generalized weakness   Lower Extremity Assessment Lower Extremity Assessment: Defer to PT evaluation   Cervical / Trunk Assessment Cervical / Trunk Assessment: Kyphotic   Communication Communication Communication: No difficulties   Cognition Arousal/Alertness: Awake/alert Behavior During Therapy: WFL for tasks assessed/performed Overall Cognitive Status: Within Functional Limits for tasks assessed                                       General Comments       Exercises Other Exercises Other Exercises: pt and husband insrtucted on B UE exercises (chair push ups) to increase strength to faciliate sit - stand, transfers. Pt able to return demo with min A   Shoulder Instructions      Home Living Family/patient expects to be discharged to:: Private residence Living Arrangements: Spouse/significant other Available Help at Discharge: Family Type of Home: House Home Access: Stairs to enter Technical brewer of Steps: 4 Entrance Stairs-Rails: Right;Left Home Layout: One level     Bathroom Shower/Tub: Occupational psychologist: Standard (with  riser) Bathroom Accessibility: No   Home Equipment: Transport chair;Grab bars - tub/shower;Rollator (4 wheels);Shower seat;Toilet riser;Adaptive equipment Adaptive Equipment: Reacher        Prior Functioning/Environment Prior Level of Function : Needs assist             Mobility Comments: typically household ambulatory, however, has not ambulated much since August. Typically pushes transport chair and sits to take breaks ADLs Comments: pt husband has been assisting with shower transfers        OT Problem List: Decreased strength;Impaired balance (sitting and/or standing);Pain;Decreased activity tolerance;Decreased coordination;Decreased knowledge of use of DME or AE      OT Treatment/Interventions: Self-care/ADL training;Patient/family education;Therapeutic exercise;Balance training;Neuromuscular  education;Therapeutic activities;DME and/or AE instruction    OT Goals(Current goals can be found in the care plan section) Acute Rehab OT Goals Patient Stated Goal: go home OT Goal Formulation: With patient/family Time For Goal Achievement: 01/13/22 Potential to Achieve Goals: Good ADL Goals Pt Will Perform Grooming: with supervision;with set-up;sitting Pt Will Perform Upper Body Bathing: with supervision;with set-up;sitting Pt Will Perform Lower Body Bathing: with mod assist;sitting/lateral leans;with caregiver independent in assisting Pt Will Perform Upper Body Dressing: with supervision;with set-up;sitting Pt Will Perform Lower Body Dressing: with max assist;with mod assist;sitting/lateral leans;with caregiver independent in assisting Pt Will Transfer to Toilet: with mod assist;stand pivot transfer;bedside commode Pt Will Perform Toileting - Clothing Manipulation and hygiene: with max assist;with mod assist;sitting/lateral leans;with caregiver independent in assisting  OT Frequency: Min 2X/week    Co-evaluation              AM-PAC OT "6 Clicks" Daily Activity     Outcome Measure Help from another person eating meals?: None Help from another person taking care of personal grooming?: A Little Help from another person toileting, which includes using toliet, bedpan, or urinal?: Total Help from another person bathing (including washing, rinsing, drying)?: A Lot Help from another person to put on and taking off regular upper body clothing?: A Little Help from another person to put on and  taking off regular lower body clothing?: Total 6 Click Score: 14   End of Session Equipment Utilized During Treatment: Gait belt;Rolling walker (2 wheels)  Activity Tolerance: Patient limited by pain;Patient limited by fatigue Patient left: in chair;with call bell/phone within reach;with family/visitor present  OT Visit Diagnosis: Unsteadiness on feet (R26.81);Other abnormalities of gait and  mobility (R26.89);Muscle weakness (generalized) (M62.81);Pain Pain - Right/Left: Left Pain - part of body: Ankle and joints of foot                Time: 3142-7670 OT Time Calculation (min): 33 min Charges:  OT General Charges $OT Visit: 1 Visit OT Evaluation $OT Eval Moderate Complexity: 1 Mod OT Treatments $Therapeutic Activity: 8-22 mins   Britt Bottom 12/30/2021, 12:28 PM

## 2021-12-30 NOTE — Progress Notes (Signed)
   12/30/21 0832  Assess: MEWS Score  BP 126/74  MAP (mmHg) 89  Pulse Rate (!) 122  ECG Heart Rate (!) 122  Resp (!) 23  SpO2 96 %  O2 Device Room Air  Assess: MEWS Score  MEWS Temp 0  MEWS Systolic 0  MEWS Pulse 2  MEWS RR 1  MEWS LOC 0  MEWS Score 3  MEWS Score Color Yellow  Assess: if the MEWS score is Yellow or Red  Were vital signs taken at a resting state? Yes  Focused Assessment No change from prior assessment  Does the patient meet 2 or more of the SIRS criteria? Yes  Does the patient have a confirmed or suspected source of infection? Yes  Provider and Rapid Response Notified? No  MEWS guidelines implemented *See Row Information* No, previously yellow, continue vital signs every 4 hours  Treat  Pain Scale 0-10  Pain Score 4  Notify: Provider  Provider Daleen Squibb MD  Date Provider Notified 12/30/21  Time Provider Notified 0900  Method of Notification Face-to-face  Notification Reason Other (Comment) (Yellow MEWS, high HR, asymptomatic. Sched Metoprolol was given.)  Provider response See new orders (Digixon IV, Cardio consult.)  Date of Provider Response 12/30/21  Assess: SIRS CRITERIA  SIRS Temperature  0  SIRS Pulse 1  SIRS Respirations  1  SIRS WBC 1  SIRS Score Sum  3

## 2021-12-30 NOTE — Progress Notes (Signed)
Orthopedic Tech Progress Note Patient Details:  [name] [birth year] [id number]  Ortho Devices Type of Ortho Device: Postop shoe/boot Ortho Device/Splint Location: LLE Ortho Device/Splint Interventions: Application   Post Interventions Patient Tolerated: Well  [name] [name] 12/30/2021, 9:27 AM

## 2021-12-30 NOTE — Progress Notes (Signed)
ANTICOAGULATION CONSULT NOTE - Initial Consult  Pharmacy Consult for Apixaban Indication: atrial fibrillation  Allergies  Allergen Reactions   Iodine Rash and Other (See Comments)    BETADINE Rash/burning, blisters on skin. Burns skin   Iohexol Rash and Other (See Comments)    Blisters; PT NEEDS 13-HOUR PREP    Povidone-Iodine Other (See Comments)    Burns skin   Tape Itching, Rash and Other (See Comments)    "DO NOT USE ADHESIVE TAPE" Not even a Band Aid" NO PAPER TAPE Burns skin Skin is very very thin    Patient Measurements: Height: 5\' 2"  (157.5 cm) Weight: 50.3 kg (111 lb) IBW/kg (Calculated) : 50.1  Vital Signs: Temp: 98.4 F (36.9 C) (09/23 0811) Temp Source: Oral (09/23 0811) BP: 126/74 (09/23 0832) Pulse Rate: 122 (09/23 0832)  Labs: Recent Labs    12/28/21 0131 12/29/21 0054 12/30/21 0618  HGB 8.6* 7.7* 9.1*  HCT 26.9* 23.2* 27.7*  PLT 363 335 399  CREATININE 0.89 0.71 0.83    Estimated Creatinine Clearance: 40.6 mL/min (by C-G formula based on SCr of 0.83 mg/dL).   Medical History: Past Medical History:  Diagnosis Date   Anemia 05/2014.   Atrial fibrillation ([place])    x 3 yrs   Breast cancer ([place])    S/P left mastectomy and chemotherapy 1989 remained in remission   Carotid artery occlusion    Carotid bruit    LICA 81-85% (duplex 6/31)   Degenerative arthritis of spine 2015   Chronic back pain   Hyperlipidemia    Hypertension    Meningioma ([place])    Osteoporosis    Pneumonia May 19, 2014   Stroke [facility])    CVA 2008   Subclavian steal syndrome    Ulcers of both lower extremities ([place]) 2015   Assessment: 81 yr old female on Apixaban 2.5 mg BID PTA for atrial fibrillation. Held for surgery 9/22, s/p L great toe amputation, debridement of wound and application of skin graft.   To resume Apixaban.   81 yrs old and 50.3 kg, so on reduced dose Apixaban.  Goal of Therapy:  Appropriate Apixaban regimen for indication Monitor platelets  by anticoagulation protocol: Yes   Plan:  Resume Apixaban 2.5 mg PO BID. Intermittent CBC. Monitor for signs/symptoms of bleeding.  Arty Baumgartner, [place] 12/30/2021,4:03 PM

## 2021-12-30 NOTE — Progress Notes (Signed)
  Echocardiogram 2D Echocardiogram has been performed.  [name] 12/30/2021, 4:15 PM

## 2021-12-31 DIAGNOSIS — I4819 Other persistent atrial fibrillation: Secondary | ICD-10-CM | POA: Diagnosis not present

## 2021-12-31 DIAGNOSIS — A419 Sepsis, unspecified organism: Secondary | ICD-10-CM | POA: Diagnosis not present

## 2021-12-31 DIAGNOSIS — R6520 Severe sepsis without septic shock: Secondary | ICD-10-CM | POA: Diagnosis not present

## 2021-12-31 LAB — CBC
HCT: 24.4 % — ABNORMAL LOW (ref 36.0–46.0)
Hemoglobin: 7.8 g/dL — ABNORMAL LOW (ref 12.0–15.0)
MCH: 31.6 pg (ref 26.0–34.0)
MCHC: 32 g/dL (ref 30.0–36.0)
MCV: 98.8 fL (ref 80.0–100.0)
Platelets: 358 10*3/uL (ref 150–400)
RBC: 2.47 MIL/uL — ABNORMAL LOW (ref 3.87–5.11)
RDW: 15.9 % — ABNORMAL HIGH (ref 11.5–15.5)
WBC: 17.9 10*3/uL — ABNORMAL HIGH (ref 4.0–10.5)
nRBC: 0.1 % (ref 0.0–0.2)

## 2021-12-31 LAB — BASIC METABOLIC PANEL
Anion gap: 9 (ref 5–15)
BUN: 24 mg/dL — ABNORMAL HIGH (ref 8–23)
CO2: 18 mmol/L — ABNORMAL LOW (ref 22–32)
Calcium: 7.5 mg/dL — ABNORMAL LOW (ref 8.9–10.3)
Chloride: 114 mmol/L — ABNORMAL HIGH (ref 98–111)
Creatinine, Ser: 0.69 mg/dL (ref 0.44–1.00)
GFR, Estimated: >60 mL/min (ref >60)
Glucose, Bld: 91 mg/dL (ref 70–99)
Potassium: 3.3 mmol/L — ABNORMAL LOW (ref 3.5–5.1)
Sodium: 141 mmol/L (ref 135–145)

## 2021-12-31 LAB — MAGNESIUM: Magnesium: 1.5 mg/dL — ABNORMAL LOW (ref 1.7–2.4)

## 2021-12-31 LAB — CALCIUM, IONIZED: Calcium, Ionized, Serum: 4.4 mg/dL — ABNORMAL LOW (ref 4.5–5.6)

## 2021-12-31 LAB — BRAIN NATRIURETIC PEPTIDE: B Natriuretic Peptide: 602 pg/mL — ABNORMAL HIGH (ref 0.0–100.0)

## 2021-12-31 MED ORDER — POTASSIUM CHLORIDE 20 MEQ PO PACK
40.0000 meq | PACK | ORAL | Status: AC
  Administered 2021-12-31 (×2): 40 meq via ORAL
  Filled 2021-12-31 (×3): qty 2

## 2021-12-31 MED ORDER — FUROSEMIDE 10 MG/ML IJ SOLN
20.0000 mg | Freq: Once | INTRAMUSCULAR | Status: AC
  Administered 2021-12-31: 20 mg via INTRAVENOUS
  Filled 2021-12-31: qty 2

## 2021-12-31 MED ORDER — MAGNESIUM OXIDE -MG SUPPLEMENT 400 (240 MG) MG PO TABS
800.0000 mg | ORAL_TABLET | Freq: Four times a day (QID) | ORAL | Status: AC
  Administered 2021-12-31 (×3): 800 mg via ORAL
  Filled 2021-12-31 (×3): qty 2

## 2021-12-31 NOTE — Plan of Care (Signed)
  Problem: Respiratory: Goal: Ability to maintain adequate ventilation will improve Outcome: Progressing   Problem: Clinical Measurements: Goal: Will remain free from infection Outcome: Progressing   Problem: Activity: Goal: Risk for activity intolerance will decrease Outcome: Progressing   Problem: Nutrition: Goal: Adequate nutrition will be maintained Outcome: Progressing

## 2021-12-31 NOTE — Plan of Care (Signed)
  Problem: Activity: Goal: Ability to return to baseline activity level will improve Outcome: Progressing   Problem: Respiratory: Goal: Ability to maintain adequate ventilation will improve Outcome: Progressing   Problem: Education: Goal: Knowledge of General Education information will improve Description: Including pain rating scale, medication(s)/side effects and non-pharmacologic comfort measures Outcome: Progressing   Problem: Activity: Goal: Risk for activity intolerance will decrease Outcome: Progressing   Problem: Nutrition: Goal: Adequate nutrition will be maintained Outcome: Progressing

## 2021-12-31 NOTE — Progress Notes (Signed)
Primary Cardiology:  [name]  Subjective:  Denies SSCP, palpitations or Dyspnea Oxy working for foot pain   Objective:  Vitals:   12/30/21 2351 12/31/21 0605 12/31/21 0852 12/31/21 1000  BP: 129/87 (!) 145/81 135/73   Pulse: 80 73 82 84  Resp:  15  14  Temp:  98.2 F (36.8 C)  98.7 F (37.1 C)  TempSrc:  Oral  Oral  SpO2: 100% 100%  100%  Weight:      Height:        Intake/Output from previous day:  Intake/Output Summary (Last 24 hours) at 12/31/2021 1153 Last data filed at 12/30/2021 1700 Gross per 24 hour  Intake 300 ml  Output 1200 ml  Net -900 ml    Physical Exam:  Affect appropriate Frail thin chronically ill white female  HEENT: normal Neck supple with no adenopathy JVP normal bilateral carotid and subclavian  bruits no thyromegaly Lungs clear with no wheezing and good diaphragmatic motion Heart:  S1/S2 no murmur, no rub, gallop or click PMI normal post left mastectomy  Abdomen: benighn, BS positve, no tenderness, no AAA no bruit.  No HSM or HJR Decreased pulse and BP in right arm  No edema Neuro UE tremors left foot drop  Skin bruising over arms  Post left great toe amputation     Lab Results: Basic Metabolic Panel: Recent Labs    12/30/21 0618 12/31/21 0158  NA 138 141  K 4.6 3.3*  CL 113* 114*  CO2 16* 18*  GLUCOSE 149* 91  BUN 18 24*  CREATININE 0.83 0.69  CALCIUM 7.7* 7.5*  MG  --  1.5*   Liver Function Tests: No results for input(s): "AST", "ALT", "ALKPHOS", "BILITOT", "PROT", "ALBUMIN" in the last 72 hours. No results for input(s): "LIPASE", "AMYLASE" in the last 72 hours. CBC: Recent Labs    12/29/21 0054 12/30/21 0618 12/31/21 0158  WBC 13.3* 16.7* 17.9*  NEUTROABS 9.9* 14.6*  --   HGB 7.7* 9.1* 7.8*  HCT 23.2* 27.7* 24.4*  MCV 96.3 96.5 98.8  PLT 335 399 358     Imaging: ECHOCARDIOGRAM COMPLETE  Result Date: 12/30/2021    ECHOCARDIOGRAM REPORT   Patient Name:   [name] Date of Exam: 12/30/2021 Medical Rec #:   [id number]    Height:       62.0 in Accession #:    [id number]   Weight:       111.0 lb Date of Birth:  November 27, 1940    BSA:          1.488 m Patient Age:    81 years     BP:           126/74 mmHg Patient Gender: F            HR:           126 bpm. Exam Location:  Inpatient Procedure: 2D Echo, Cardiac Doppler, Color Doppler and Intracardiac            Opacification Agent Indications:    I48.91* Unspeicified atrial fibrillation  History:        Patient has prior history of Echocardiogram examinations, most                 recent 10/12/2009. CAD, Abnormal ECG, COPD and Pulmonary HTN,                 Arrythmias:Atrial Fibrillation and Atrial Flutter,  Signs/Symptoms:Syncope and Dizziness/Lightheadedness; Risk                 Factors:Hypertension and Dyslipidemia. Breast cancer. Lung mass.                 ICA stenosis. Subclavian steal 2008.  Sonographer:    Roseanna Rainbow RDCS Referring Phys: Doreatha Martin [facility]  Sonographer Comments: Technically difficult study due to poor echo windows, suboptimal parasternal window, suboptimal apical window and suboptimal subcostal window. Image acquisition challenging due to respiratory motion. Study difficult due to thin habitus. IMPRESSIONS  1. EF very challenging with HR 126 bpm. Left ventricular ejection fraction, by estimation, is 55 to 60%. The left ventricle has normal function. The left ventricle has no regional wall motion abnormalities. There is mild left ventricular hypertrophy. Left ventricular diastolic parameters are indeterminate.  2. Right ventricular systolic function is normal. The right ventricular size is normal. There is mildly elevated pulmonary artery systolic pressure.  3. Left atrial size was mildly dilated.  4. Mild mitral valve regurgitation.  5. Tricuspid valve regurgitation is mild to moderate.  6. The aortic valve was not well visualized. Aortic valve regurgitation is not visualized.  7. The inferior vena cava is normal in size with greater than  50% respiratory variability, suggesting right atrial pressure of 3 mmHg. FINDINGS  Left Ventricle: EF very challenging with HR 126 bpm. Left ventricular ejection fraction, by estimation, is 55 to 60%. The left ventricle has normal function. The left ventricle has no regional wall motion abnormalities. Definity contrast agent was given  IV to delineate the left ventricular endocardial borders. The left ventricular internal cavity size was normal in size. There is mild left ventricular hypertrophy. Left ventricular diastolic parameters are indeterminate. Right Ventricle: The right ventricular size is normal. Right vetricular wall thickness was not well visualized. Right ventricular systolic function is normal. There is mildly elevated pulmonary artery systolic pressure. The tricuspid regurgitant velocity  is 2.78 m/s, and with an assumed right atrial pressure of 8 mmHg, the estimated right ventricular systolic pressure is 44.0 mmHg. Left Atrium: Left atrial size was mildly dilated. Right Atrium: Right atrial size was normal in size. Pericardium: There is no evidence of pericardial effusion. Mitral Valve: Mild mitral valve regurgitation. Tricuspid Valve: Tricuspid valve regurgitation is mild to moderate. Aortic Valve: The aortic valve was not well visualized. Aortic valve regurgitation is not visualized. Aortic regurgitation PHT measures 479 msec. Pulmonic Valve: Pulmonic valve regurgitation is not visualized. Aorta: The aortic root and ascending aorta are structurally normal, with no evidence of dilitation. Venous: The inferior vena cava is normal in size with greater than 50% respiratory variability, suggesting right atrial pressure of 3 mmHg. IAS/Shunts: The interatrial septum was not well visualized.  LEFT VENTRICLE PLAX 2D LVIDd:         3.80 cm     Diastology LVIDs:         3.40 cm     LV e' medial:    5.66 cm/s LV PW:         0.80 cm     LV E/e' medial:  15.2 LV IVS:        1.10 cm     LV e' lateral:   5.66 cm/s  LVOT diam:     2.00 cm     LV E/e' lateral: 15.2 LV SV:         38 LV SV Index:   26 LVOT Area:     3.14 cm  LV  Volumes (MOD) LV vol d, MOD A2C: 64.1 ml LV vol d, MOD A4C: 52.7 ml LV vol s, MOD A2C: 35.7 ml LV vol s, MOD A4C: 20.7 ml LV SV MOD A2C:     28.4 ml LV SV MOD A4C:     52.7 ml LV SV MOD BP:      34.0 ml RIGHT VENTRICLE             IVC RV S prime:     10.70 cm/s  IVC diam: 1.90 cm TAPSE (M-mode): 0.7 cm LEFT ATRIUM           Index        RIGHT ATRIUM          Index LA diam:      4.40 cm 2.96 cm/m   RA Area:     7.72 cm LA Vol (A2C): 25.9 ml 17.40 ml/m  RA Volume:   14.00 ml 9.41 ml/m LA Vol (A4C): 53.2 ml 35.74 ml/m  AORTIC VALVE LVOT Vmax:   80.90 cm/s LVOT Vmean:  49.800 cm/s LVOT VTI:    0.121 m AI PHT:      479 msec  AORTA Ao Root diam: 3.30 cm Ao Asc diam:  3.30 cm MITRAL VALVE               TRICUSPID VALVE MV Area (PHT): 7.44 cm    TR Peak grad:   30.9 mmHg MV Decel Time: 102 msec    TR Vmax:        278.00 cm/s MV E velocity: 86.20 cm/s                            SHUNTS                            Systemic VTI:  0.12 m                            Systemic Diam: 2.00 cm Phineas Inches Electronically signed by Phineas Inches Signature Date/Time: 12/30/2021/4:19:20 PM    Final    DG CHEST PORT 1 VIEW  Result Date: 12/30/2021 CLINICAL DATA:  Leukocytosis. EXAM: PORTABLE CHEST 1 VIEW COMPARISON:  December 25, 2021 FINDINGS: Calcific atherosclerotic disease and tortuosity of the aorta. Cardiomediastinal silhouette is normal. Mediastinal contours appear intact. Interval development small bilateral pleural effusions. Streaky bibasilar airspace opacities may represent atelectasis or airspace consolidation. Stable right lung probable scarring. Osseous structures are without acute abnormality. Left chest wall stable postsurgical changes. IMPRESSION: 1. Interval development of small bilateral pleural effusions. 2. Streaky bibasilar airspace opacities may represent atelectasis or airspace consolidation.  Electronically Signed   By: Fidela Salisbury M.D.   On: 12/30/2021 15:12    Cardiac Studies:  ECG: afib rate 123 non specific ST changes    Telemetry:  Afib rate 100-120 bpm  Echo: 12/30/21 EF 55-60% mild MR mild/mod TR  Medications:    apixaban  2.5 mg Oral BID   Chlorhexidine Gluconate Cloth  6 each Topical Q0600   docusate sodium  100 mg Oral BID   feeding supplement  1 Container Oral TID BM   magnesium oxide  800 mg Oral Q6H   metoprolol tartrate  100 mg Oral BID   metroNIDAZOLE  500 mg Oral Q12H   mupirocin ointment  1 Application Nasal BID   nutrition supplement (JUVEN)  1 packet Oral BID BM   potassium chloride  40 mEq Oral Q4H   pravastatin  40 mg Oral Daily      cefTRIAXone (ROCEPHIN)  IV 2 g (12/30/21 2206)    Assessment/Plan:   PAF:  low dose eliquis resumed post amputation Low dose with age low body weight and previous GI bleeding with gastric/duodenal ulcers CHADVASC 7 consider adding low dose amiodarone  CVA:  history of with right innominate occlusion and moderate LICA stenosis 19-37% duplex 03/16/21  Would take BP in left arm   3. PVD:  stent to left SFA balloon of popliteal and peroneal 12/19/21 with amputation of left great toe this       Admission  4. Pulmonary:  history of necrotizing pneumonia CXR small bilateral effusions bibasilar atelectasis Use      IS  5. HLD:  continue statin    Jenkins Rouge 12/31/2021, 11:53 AM

## 2021-12-31 NOTE — Progress Notes (Signed)
PROGRESS NOTE    [name]  SWH:[id number] DOB: [birth year] DOA: 12/24/2021 PCP: Celene Squibb, MD   Brief Narrative:  81 y.o. female with medical history significant of atrial fibrillation, anemia, hyperlipidemia, hypertension, stroke, peripheral vascular disease, and more presented to the ED with a chief complaint of diarrhea.  She is also reported of left foot wound with significant pain, previously started on Bactrim MRI indicated left great toe osteomyelitis, seen by vascular team.  Start broad-spectrum antibiotics.  Diarrhea appears to have resolved.  Podiatry to go to the OR on 9/22 for incisional debridement amputation of the left great toe.  Postop developed atrial flutter therefore cardiology consulted.   Assessment & Plan:  Principal Problem:   Severe sepsis ([place]) Active Problems:   Hyperlipemia   Essential hypertension   Atrial fibrillation (HCC)   Protein-calorie malnutrition, moderate (HCC)   AKI (acute kidney injury) ([place])   Diarrhea   Hyperkalemia   Osteomyelitis of great toe of left foot (HCC)   Atrial flutter with rapid ventricular response (HCC)   Severe sepsis secondary to left great toe osteomyelitis Status post debridement and amputation of left great toe 9/22. - Status post amputation.  Continue to follow culture data.  Currently on Rocephin and Flagyl.  ID following. - ID following   Hyperkalemia; resolved -Lokelma given  Paroxysmal atrial fibrillation/flutter (HCC) - Metoprolol 100 mg twice daily.  Received 1 dose of IV digoxin 9/23.  Update echocardiogram-EF 55%.  Cardiology consulted.  Eliquis resumed  Abnormal breath sounds suspicion for slight volume overload Acute diastolic CHF with preserved EF.  EF 55%.  Class III - We will give another dose of IV Lasix today    Diarrhea; resolved -Appears to have resolved.  Initially C. difficile was ordered.   AKI (acute kidney injury) ([place]); resolved -Secondary to dehydration and hypotension.  Creatinine  peaked at 1.9.  Today 0.8   Protein-calorie malnutrition, moderate (HCC) -Dietitian   Constipation Lactulose and prn meds ordered.     History of essential hypertension but presented with severe hypotension -On Metoprolol 100mg  bid   Hyperlipemia: - Continue pravastatin   DVT prophylaxis: SCDs Code Status: Full code Family Communication: None at bedside  Status is: Inpatient Ongoing management of underlying atrial fibrillation with RVR.  ID following to eventually recommend antibiotics  Nutritional status    Signs/Symptoms: moderate fat depletion, severe muscle depletion  Interventions: MVI, Boost Breeze, Juven  Body mass index is 20.3 kg/m.         Subjective:  Heart rate is better controlled in 90s this morning still in atrial fibrillation.  Shortness of breath improved but not resolved.  Examination: Constitutional: Not in acute distress Respiratory: Bibasilar crackles Cardiovascular: irRegularly irregular.  Heart rate in 90s Abdomen: Nontender nondistended good bowel sounds Musculoskeletal: No edema noted Skin: Left lower extremity dressing is in place Neurologic: CN 2-12 grossly intact.  And nonfocal Psychiatric: Normal judgment and insight. Alert and oriented x 3. Normal mood.  Objective: Vitals:   12/30/21 2351 12/31/21 0605 12/31/21 0852 12/31/21 1000  BP: 129/87 (!) 145/81 135/73   Pulse: 80 73 82 84  Resp:  15  14  Temp:  98.2 F (36.8 C)  98.7 F (37.1 C)  TempSrc:  Oral  Oral  SpO2: 100% 100%  100%  Weight:      Height:        Intake/Output Summary (Last 24 hours) at 12/31/2021 1108 Last data filed at 12/30/2021 1700 Gross per 24 hour  Intake  300 ml  Output 1200 ml  Net -900 ml   Filed Weights   12/24/21 2333  Weight: 50.3 kg     Data Reviewed:   CBC: Recent Labs  Lab 12/26/21 0123 12/27/21 0130 12/28/21 0131 12/29/21 0054 12/30/21 0618 12/31/21 0158  WBC 24.2* 18.7* 14.2* 13.3* 16.7* 17.9*  NEUTROABS 20.2* 14.5*  10.2* 9.9* 14.6*  --   HGB 8.5* 7.8* 8.6* 7.7* 9.1* 7.8*  HCT 25.3* 24.0* 26.9* 23.2* 27.7* 24.4*  MCV 95.8 96.8 98.9 96.3 96.5 98.8  PLT 330 327 363 335 399 536   Basic Metabolic Panel: Recent Labs  Lab 12/25/21 1038 12/26/21 0123 12/27/21 0130 12/28/21 0131 12/29/21 0054 12/30/21 0618 12/31/21 0158  NA 134*   < > 139 139 139 138 141  K 4.8   < > 5.3* 4.7 3.1* 4.6 3.3*  CL 107   < > 116* 115* 118* 113* 114*  CO2 18*   < > 19* 18* 16* 16* 18*  GLUCOSE 80   < > 80 87 162* 149* 91  BUN 42*   < > 18 17 16 18  24*  CREATININE 1.94*   < > 1.13* 0.89 0.71 0.83 0.69  CALCIUM 7.8*   < > 7.2* 7.5* 6.6* 7.7* 7.5*  MG 2.3  --   --   --   --   --  1.5*   < > = values in this interval not displayed.   GFR: Estimated Creatinine Clearance: 42.1 mL/min (by C-G formula based on SCr of 0.69 mg/dL). Liver Function Tests: Recent Labs  Lab 12/25/21 0000 12/25/21 0557 12/25/21 1038  AST 49* 39 37  ALT 40 31 28  ALKPHOS 65 51 50  BILITOT 0.6 0.8 0.6  PROT 7.3 5.9* 5.8*  ALBUMIN 3.0* 2.5* 2.5*   Recent Labs  Lab 12/25/21 0000  LIPASE 36   No results for input(s): "AMMONIA" in the last 168 hours. Coagulation Profile: Recent Labs  Lab 12/25/21 1038  INR 1.4*   Cardiac Enzymes: No results for input(s): "CKTOTAL", "CKMB", "CKMBINDEX", "TROPONINI" in the last 168 hours. BNP (last 3 results) No results for input(s): "PROBNP" in the last 8760 hours. HbA1C: No results for input(s): "HGBA1C" in the last 72 hours. CBG: No results for input(s): "GLUCAP" in the last 168 hours. Lipid Profile: No results for input(s): "CHOL", "HDL", "LDLCALC", "TRIG", "CHOLHDL", "LDLDIRECT" in the last 72 hours. Thyroid Function Tests: No results for input(s): "TSH", "T4TOTAL", "FREET4", "T3FREE", "THYROIDAB" in the last 72 hours. Anemia Panel: No results for input(s): "VITAMINB12", "FOLATE", "FERRITIN", "TIBC", "IRON", "RETICCTPCT" in the last 72 hours. Sepsis Labs: Recent Labs  Lab 12/25/21 0139  12/25/21 0343 12/25/21 0557 12/25/21 1159 12/26/21 0123 12/27/21 0130  PROCALCITON  --  0.72  --   --  0.50 0.27  LATICACIDVEN 2.7* 3.4* 3.2* 1.4  --   --     Recent Results (from the past 240 hour(s))  Culture, blood (routine x 2)     Status: None   Collection Time: 12/25/21  1:15 AM   Specimen: BLOOD LEFT HAND  Result Value Ref Range Status   Specimen Description BLOOD LEFT HAND BOTTLES DRAWN AEROBIC ONLY  Final   Special Requests   Final    Blood Culture results may not be optimal due to an inadequate volume of blood received in culture bottles   Culture   Final    NO GROWTH 5 DAYS Performed at [facility], [address], [place], [place] [address]  Report Status 12/30/2021 FINAL  Final  Culture, blood (routine x 2)     Status: None   Collection Time: 12/25/21  1:30 AM   Specimen: Left Antecubital; Blood  Result Value Ref Range Status   Specimen Description   Final    LEFT ANTECUBITAL BOTTLES DRAWN AEROBIC AND ANAEROBIC   Special Requests Blood Culture adequate volume  Final   Culture  Setup Time ANAEROBIC BOTTLE ONLY NO ORGANISMS SEEN   Final   Culture   Final    NO GROWTH 5 DAYS Performed at [facility], [address], [place], [place] [address]    Report Status 12/30/2021 FINAL  Final  Surgical pcr screen     Status: Abnormal   Collection Time: 12/29/21 12:14 PM   Specimen: Nasal Mucosa; Nasal Swab  Result Value Ref Range Status   MRSA, PCR POSITIVE (A) NEGATIVE Final    Comment: RESULT CALLED TO, READ BACK BY AND VERIFIED WITH: 12/29/21 1430 RN KIANG    Staphylococcus aureus POSITIVE (A) NEGATIVE Final    Comment: (NOTE) The Xpert SA Assay (FDA approved for NASAL specimens in patients 33 years of age and older), is one component of a comprehensive surveillance program. It is not intended to diagnose infection nor to guide or monitor treatment. Performed at [facility], [place] [address] [place], [place] [address]          Radiology  Studies: ECHOCARDIOGRAM COMPLETE  Result Date: 12/30/2021    ECHOCARDIOGRAM REPORT   Patient Name:   JALAN BODI Date of Exam: 12/30/2021 Medical Rec #:  [id number]    Height:       62.0 in Accession #:    [id number]   Weight:       111.0 lb Date of Birth:  Mar 01, 1941    BSA:          1.488 m Patient Age:    14 years     BP:           126/74 mmHg Patient Gender: F            HR:           126 bpm. Exam Location:  Inpatient Procedure: 2D Echo, Cardiac Doppler, Color Doppler and Intracardiac            Opacification Agent Indications:    I48.91* Unspeicified atrial fibrillation  History:        Patient has prior history of Echocardiogram examinations, most                 recent 10/12/2009. CAD, Abnormal ECG, COPD and Pulmonary HTN,                 Arrythmias:Atrial Fibrillation and Atrial Flutter,                 Signs/Symptoms:Syncope and Dizziness/Lightheadedness; Risk                 Factors:Hypertension and Dyslipidemia. Breast cancer. Lung mass.                 ICA stenosis. Subclavian steal 2008.  Sonographer:    Roseanna Rainbow RDCS Referring Phys: Doreatha Martin [facility]  Sonographer Comments: Technically difficult study due to poor echo windows, suboptimal parasternal window, suboptimal apical window and suboptimal subcostal window. Image acquisition challenging due to respiratory motion. Study difficult due to thin habitus. IMPRESSIONS  1. EF very challenging with HR 126 bpm. Left ventricular ejection fraction, by estimation, is 55  to 60%. The left ventricle has normal function. The left ventricle has no regional wall motion abnormalities. There is mild left ventricular hypertrophy. Left ventricular diastolic parameters are indeterminate.  2. Right ventricular systolic function is normal. The right ventricular size is normal. There is mildly elevated pulmonary artery systolic pressure.  3. Left atrial size was mildly dilated.  4. Mild mitral valve regurgitation.  5. Tricuspid valve regurgitation is mild to  moderate.  6. The aortic valve was not well visualized. Aortic valve regurgitation is not visualized.  7. The inferior vena cava is normal in size with greater than 50% respiratory variability, suggesting right atrial pressure of 3 mmHg. FINDINGS  Left Ventricle: EF very challenging with HR 126 bpm. Left ventricular ejection fraction, by estimation, is 55 to 60%. The left ventricle has normal function. The left ventricle has no regional wall motion abnormalities. Definity contrast agent was given  IV to delineate the left ventricular endocardial borders. The left ventricular internal cavity size was normal in size. There is mild left ventricular hypertrophy. Left ventricular diastolic parameters are indeterminate. Right Ventricle: The right ventricular size is normal. Right vetricular wall thickness was not well visualized. Right ventricular systolic function is normal. There is mildly elevated pulmonary artery systolic pressure. The tricuspid regurgitant velocity  is 2.78 m/s, and with an assumed right atrial pressure of 8 mmHg, the estimated right ventricular systolic pressure is 31.5 mmHg. Left Atrium: Left atrial size was mildly dilated. Right Atrium: Right atrial size was normal in size. Pericardium: There is no evidence of pericardial effusion. Mitral Valve: Mild mitral valve regurgitation. Tricuspid Valve: Tricuspid valve regurgitation is mild to moderate. Aortic Valve: The aortic valve was not well visualized. Aortic valve regurgitation is not visualized. Aortic regurgitation PHT measures 479 msec. Pulmonic Valve: Pulmonic valve regurgitation is not visualized. Aorta: The aortic root and ascending aorta are structurally normal, with no evidence of dilitation. Venous: The inferior vena cava is normal in size with greater than 50% respiratory variability, suggesting right atrial pressure of 3 mmHg. IAS/Shunts: The interatrial septum was not well visualized.  LEFT VENTRICLE PLAX 2D LVIDd:         3.80 cm      Diastology LVIDs:         3.40 cm     LV e' medial:    5.66 cm/s LV PW:         0.80 cm     LV E/e' medial:  15.2 LV IVS:        1.10 cm     LV e' lateral:   5.66 cm/s LVOT diam:     2.00 cm     LV E/e' lateral: 15.2 LV SV:         38 LV SV Index:   26 LVOT Area:     3.14 cm  LV Volumes (MOD) LV vol d, MOD A2C: 64.1 ml LV vol d, MOD A4C: 52.7 ml LV vol s, MOD A2C: 35.7 ml LV vol s, MOD A4C: 20.7 ml LV SV MOD A2C:     28.4 ml LV SV MOD A4C:     52.7 ml LV SV MOD BP:      34.0 ml RIGHT VENTRICLE             IVC RV S prime:     10.70 cm/s  IVC diam: 1.90 cm TAPSE (M-mode): 0.7 cm LEFT ATRIUM           Index  RIGHT ATRIUM          Index LA diam:      4.40 cm 2.96 cm/m   RA Area:     7.72 cm LA Vol (A2C): 25.9 ml 17.40 ml/m  RA Volume:   14.00 ml 9.41 ml/m LA Vol (A4C): 53.2 ml 35.74 ml/m  AORTIC VALVE LVOT Vmax:   80.90 cm/s LVOT Vmean:  49.800 cm/s LVOT VTI:    0.121 m AI PHT:      479 msec  AORTA Ao Root diam: 3.30 cm Ao Asc diam:  3.30 cm MITRAL VALVE               TRICUSPID VALVE MV Area (PHT): 7.44 cm    TR Peak grad:   30.9 mmHg MV Decel Time: 102 msec    TR Vmax:        278.00 cm/s MV E velocity: 86.20 cm/s                            SHUNTS                            Systemic VTI:  0.12 m                            Systemic Diam: 2.00 cm Phineas Inches Electronically signed by Phineas Inches Signature Date/Time: 12/30/2021/4:19:20 PM    Final    DG CHEST PORT 1 VIEW  Result Date: 12/30/2021 CLINICAL DATA:  Leukocytosis. EXAM: PORTABLE CHEST 1 VIEW COMPARISON:  December 25, 2021 FINDINGS: Calcific atherosclerotic disease and tortuosity of the aorta. Cardiomediastinal silhouette is normal. Mediastinal contours appear intact. Interval development small bilateral pleural effusions. Streaky bibasilar airspace opacities may represent atelectasis or airspace consolidation. Stable right lung probable scarring. Osseous structures are without acute abnormality. Left chest wall stable postsurgical changes.  IMPRESSION: 1. Interval development of small bilateral pleural effusions. 2. Streaky bibasilar airspace opacities may represent atelectasis or airspace consolidation. Electronically Signed   By: Fidela Salisbury M.D.   On: 12/30/2021 15:12        Scheduled Meds:  apixaban  2.5 mg Oral BID   Chlorhexidine Gluconate Cloth  6 each Topical Q0600   docusate sodium  100 mg Oral BID   feeding supplement  1 Container Oral TID BM   furosemide  20 mg Intravenous Once   magnesium oxide  800 mg Oral Q6H   metoprolol tartrate  100 mg Oral BID   metroNIDAZOLE  500 mg Oral Q12H   mupirocin ointment  1 Application Nasal BID   nutrition supplement (JUVEN)  1 packet Oral BID BM   potassium chloride  40 mEq Oral Q4H   pravastatin  40 mg Oral Daily   Continuous Infusions:  cefTRIAXone (ROCEPHIN)  IV 2 g (12/30/21 2206)     LOS: 6 days   Time spent= 35 mins    Magon Croson Arsenio Loader, MD [facility]  If 7PM-7AM, please contact night-coverage  12/31/2021, 11:08 AM

## 2021-12-31 NOTE — TOC Initial Note (Signed)
Transition of Care [facility]) - Initial/Assessment Note    Patient Details  Name: [name] MRN: [id number] Date of Birth: 12-19-1940  [facility] CM/SW Contact:    Bartholomew Crews, RN Phone Number: [phone] 12/31/2021, 4:02 PM  Clinical Narrative:                  Spoke with patient on hospital room phone to discuss post acute transition. Patient from home with her husband. She is agreeable to [facility] PT, and has no preference for agency. She did request that RNCM call her husband to discuss transition plans. Spoke with spouse on the phone [phone] who is agreeable to [facility], but stated that while he is willing to help her he is limited in his ability d/t his own health concerns. He stated that he is not able to pick her up. When asked about having BSC, he said they don't have one, but he would not be able to take care of it. Stated that it can be used over the toilet to help with standing up because it is higher and offers handles. Referral to [facility] for [facility] and to [facility] for PT [OT to be added later] accepted. TOC following for transition needs.   Expected Discharge Plan: [place] Barriers to Discharge: Continued Medical Work up   Patient Goals and CMS Choice Patient states their goals for this hospitalization and ongoing recovery are:: home with husband CMS Medicare.gov Compare Post Acute Care list provided to:: Patient Choice offered to / list presented to : Patient, Spouse  Expected Discharge Plan and Services Expected Discharge Plan: [place]   Discharge Planning Services: CM Consult Post Acute Care Choice: Home Health, Durable Medical Equipment Living arrangements for the past 2 months: Single Family Home                 DME Arranged: 3-N-1 DME Agency: [facility] Date DME Agency Contacted: 12/31/21 Time DME Agency Contacted: 72 Representative spoke with at DME Agency: [place]: PT [place]: [facility] (now known as [employer]) Date [place]: 12/31/21 Time [place]: 63 Representative spoke with at [place]: [place] Arrangements/Services Living arrangements for the past 2 months: [place] with:: Self, Spouse Patient language and need for interpreter reviewed:: Yes Do you feel safe going back to the place where you live?: Yes      Need for Family Participation in Patient Care: Yes (Comment)     Criminal Activity/Legal Involvement Pertinent to Current Situation/Hospitalization: No - Comment as needed  Activities of Daily Living Home Assistive Devices/Equipment: Environmental consultant (specify type), Shower chair with back, Raised toilet seat with rails, Grab bars in shower, Eyeglasses (rollator w/ seat) ADL Screening (condition at time of admission) Patient's cognitive ability adequate to safely complete daily activities?: Yes Is the patient deaf or have difficulty hearing?: Yes Does the patient have difficulty seeing, even when wearing glasses/contacts?: No Does the patient have difficulty concentrating, remembering, or making decisions?: No Patient able to express need for assistance with ADLs?: No Does the patient have difficulty dressing or bathing?: Yes Independently performs ADLs?: No Communication: Independent Toileting: Independent with device (comment) (walker) Walks in Home: Independent with device (comment) (walker) Does the patient have difficulty walking or climbing stairs?: Yes Weakness of Legs: Both Weakness of Arms/Hands: Both  Permission Sought/Granted Permission sought to share information with : Family Supports    Share Information with  NAME: Anjelica Gorniak     Permission granted to share info w Relationship: spouse  Permission granted to share info w Contact Information: [phone]  Emotional Assessment Appearance:: Appears stated age Attitude/Demeanor/Rapport: Engaged Affect (typically observed):  Accepting Orientation: : Oriented to Self, Oriented to Place, Oriented to  Time, Oriented to Situation Alcohol / Substance Use: Not Applicable Psych Involvement: No (comment)  Admission diagnosis:  Diarrhea of presumed infectious origin [R19.7] Sepsis ([place]) [A41.9] Leukocytosis, unspecified type [D72.829] Patient Active Problem List   Diagnosis Date Noted   Atrial flutter with rapid ventricular response ([place])    Severe sepsis ([place]) 12/25/2021   Diarrhea 12/25/2021   Hyperkalemia 12/25/2021   Osteomyelitis of great toe of left foot ([place]) 12/25/2021   PAD (peripheral artery disease) ([place]) 12/19/2021   Osteoporosis    Tibial plateau fracture, left approx. 05/10/17 06/27/2017   COPD, moderate ([place]) 07/12/2015   Multiple gastric ulcers    Coagulopathy (HCC)    Bleeding gastrointestinal    Acute GI bleeding    CAD in native artery    Supratherapeutic INR    Right lower lobe lung mass    Cavitary lesion of lung    AKI (acute kidney injury) ([place])    SIRS (systemic inflammatory response syndrome) (HCC)    Chronic back pain    GI bleed 02/14/2015   Carotid artery disease ([place]) 02/14/2015   Leukocytosis 02/14/2015   Lung nodules 09/29/2014   Smoking history 09/29/2014   Pneumonia with cavity of lung 07/09/2014   Lung mass 07/09/2014   Thrombocytosis ([place]) 05/21/2014   Protein-calorie malnutrition, moderate (HCC) 05/21/2014   Anemia    Generalized weakness    Necrotic pneumonia ([place]) 05/20/2014   Pneumonia 05/20/2014   Elevated liver enzymes 12/16/2013   Tremor 06/24/2013   Encounter for therapeutic drug monitoring 05/18/2013   Dizziness 01/12/2013   Wound of ankle 10/23/2010   Long term current use of anticoagulant 07/05/2010   Carotid bruit 11/01/2009   Hyperlipemia 10/26/2009   Essential hypertension 10/26/2009   Atrial fibrillation ([place]) 10/26/2009   ACUTE ON CHRONIC DIASTOLIC HEART FAILURE 82/42/3536   SYNCOPE AND COLLAPSE 10/26/2009   PCP:  Celene Squibb, MD Pharmacy:    [place], [place] [address] [place] [address] Phone: [phone] Fax: [phone]  [facility] [place] [facility] [address] [employer] [address] [place] [address] Phone: [phone] Fax: [phone]     Social Determinants of Health (SDOH) Interventions    Readmission Risk Interventions     No data to display

## 2021-12-31 NOTE — Progress Notes (Signed)
Physical Therapy Treatment Patient Details Name: [name] MRN: [id number] DOB: 02-Jun-1940 Today's Date: 12/31/2021   History of Present Illness Pt is a 81 y.o. F who presents 12/24/2021 with severe sepsis secondary to left great toe osteomyelitis now s/p 1st toe amputation 12/29/2021. Significant PMH: atrial fibrillation, anemia, HLD, HTN, stroke, PVD.    PT Comments    Pt received up in chair and motivated to participate in physical therapy session. Focus on seated exercises for BLE strengthening and transfer training. Pt performing 8 serial sit to stands from chair with emphasis on anterior weight shift. HR 90-118 bpm. Will continue to progress as tolerated.    Recommendations for follow up therapy are one component of a multi-disciplinary discharge planning process, led by the attending physician.  Recommendations may be updated based on patient status, additional functional criteria and insurance authorization.  Follow Up Recommendations  Home health PT     Assistance Recommended at Discharge Frequent or constant Supervision/Assistance  Patient can return home with the following A lot of help with walking and/or transfers;A lot of help with bathing/dressing/bathroom   Equipment Recommendations  BSC/3in1    Recommendations for Other Services       Precautions / Restrictions Precautions Precautions: Fall;Other (comment) Precaution Comments: watch HR Required Braces or Orthoses: Other Brace Other Brace: L post op shoe Restrictions Weight Bearing Restrictions: Yes LLE Weight Bearing: Weight bearing as tolerated     Mobility  Bed Mobility               General bed mobility comments: OOB in chair    Transfers Overall transfer level: Needs assistance Equipment used: Rolling walker (2 wheels), None Transfers: Sit to/from Stand Sit to Stand: Mod assist           General transfer comment: Pt performed x 8 sit to stands from chair with face to face vs RW. Max  multimodal cues for foot/hand placement, rocking forward to gain momentum, "nose over toes," anterior weight shift.    Ambulation/Gait                   Stairs             Wheelchair Mobility    Modified Rankin (Stroke Patients Only)       Balance Overall balance assessment: Needs assistance Sitting-balance support: Feet supported Sitting balance-Leahy Scale: Good     Standing balance support: Bilateral upper extremity supported Standing balance-Leahy Scale: Poor                              Cognition Arousal/Alertness: Awake/alert Behavior During Therapy: WFL for tasks assessed/performed Overall Cognitive Status: Impaired/Different from baseline Area of Impairment: Problem solving                             Problem Solving: Requires verbal cues          Exercises General Exercises - Lower Extremity Quad Sets: Both, 15 reps, Supine Long Arc Quad: Both, 20 reps, Seated Hip ABduction/ADduction: Both, 15 reps, Seated Hip Flexion/Marching: Both, 10 reps, Seated    General Comments        Pertinent Vitals/Pain Pain Assessment Pain Assessment: Faces Faces Pain Scale: Hurts a little bit Pain Location: L foot Pain Descriptors / Indicators: Grimacing Pain Intervention(s): Monitored during session    Home Living  Prior Function            PT Goals (current goals can now be found in the care plan section) Acute Rehab PT Goals Patient Stated Goal: to go home Potential to Achieve Goals: Good Progress towards PT goals: Progressing toward goals    Frequency    Min 3X/week      PT Plan Current plan remains appropriate    Co-evaluation              AM-PAC PT "6 Clicks" Mobility   Outcome Measure  Help needed turning from your back to your side while in a flat bed without using bedrails?: A Little Help needed moving from lying on your back to sitting on the side of a flat  bed without using bedrails?: A Lot Help needed moving to and from a bed to a chair (including a wheelchair)?: A Lot Help needed standing up from a chair using your arms (e.g., wheelchair or bedside chair)?: A Lot Help needed to walk in hospital room?: Total Help needed climbing 3-5 steps with a railing? : Total 6 Click Score: 11    End of Session Equipment Utilized During Treatment: Gait belt;Oxygen Activity Tolerance: Patient tolerated treatment well Patient left: in chair;with call bell/phone within reach;with chair alarm set Nurse Communication: Mobility status PT Visit Diagnosis: Unsteadiness on feet (R26.81);Muscle weakness (generalized) (M62.81);Difficulty in walking, not elsewhere classified (R26.2)     Time: 1100-1133 PT Time Calculation (min) (ACUTE ONLY): 33 min  Charges:  $Therapeutic Activity: 23-37 mins                     Wyona Almas, PT, DPT Acute Rehabilitation Services Office [phone]    Deno Etienne 12/31/2021, 12:45 PM

## 2022-01-01 ENCOUNTER — Encounter (HOSPITAL_COMMUNITY): Payer: Self-pay | Admitting: Podiatry

## 2022-01-01 ENCOUNTER — Telehealth: Payer: Self-pay | Admitting: Physician Assistant

## 2022-01-01 DIAGNOSIS — A419 Sepsis, unspecified organism: Secondary | ICD-10-CM | POA: Diagnosis not present

## 2022-01-01 DIAGNOSIS — I480 Paroxysmal atrial fibrillation: Secondary | ICD-10-CM | POA: Diagnosis not present

## 2022-01-01 DIAGNOSIS — M869 Osteomyelitis, unspecified: Secondary | ICD-10-CM | POA: Diagnosis not present

## 2022-01-01 DIAGNOSIS — R6520 Severe sepsis without septic shock: Secondary | ICD-10-CM | POA: Diagnosis not present

## 2022-01-01 LAB — CBC
HCT: 24.7 % — ABNORMAL LOW (ref 36.0–46.0)
Hemoglobin: 8 g/dL — ABNORMAL LOW (ref 12.0–15.0)
MCH: 32 pg (ref 26.0–34.0)
MCHC: 32.4 g/dL (ref 30.0–36.0)
MCV: 98.8 fL (ref 80.0–100.0)
Platelets: 355 10*3/uL (ref 150–400)
RBC: 2.5 MIL/uL — ABNORMAL LOW (ref 3.87–5.11)
RDW: 16.3 % — ABNORMAL HIGH (ref 11.5–15.5)
WBC: 16.2 10*3/uL — ABNORMAL HIGH (ref 4.0–10.5)
nRBC: 0.2 % (ref 0.0–0.2)

## 2022-01-01 LAB — BASIC METABOLIC PANEL
Anion gap: 8 (ref 5–15)
BUN: 26 mg/dL — ABNORMAL HIGH (ref 8–23)
CO2: 19 mmol/L — ABNORMAL LOW (ref 22–32)
Calcium: 8 mg/dL — ABNORMAL LOW (ref 8.9–10.3)
Chloride: 113 mmol/L — ABNORMAL HIGH (ref 98–111)
Creatinine, Ser: 0.68 mg/dL (ref 0.44–1.00)
GFR, Estimated: >60 mL/min (ref >60)
Glucose, Bld: 93 mg/dL (ref 70–99)
Potassium: 3.9 mmol/L (ref 3.5–5.1)
Sodium: 140 mmol/L (ref 135–145)

## 2022-01-01 LAB — SURGICAL PATHOLOGY

## 2022-01-01 LAB — MAGNESIUM: Magnesium: 1.7 mg/dL (ref 1.7–2.4)

## 2022-01-01 MED ORDER — AMOXICILLIN-POT CLAVULANATE 875-125 MG PO TABS
1.0000 | ORAL_TABLET | Freq: Two times a day (BID) | ORAL | Status: DC
  Administered 2022-01-01 – 2022-01-02 (×2): 1 via ORAL
  Filled 2022-01-01 (×2): qty 1

## 2022-01-01 MED ORDER — POTASSIUM CHLORIDE 20 MEQ PO PACK
20.0000 meq | PACK | Freq: Once | ORAL | Status: AC
  Administered 2022-01-01: 20 meq via ORAL
  Filled 2022-01-01: qty 1

## 2022-01-01 MED ORDER — FUROSEMIDE 10 MG/ML IJ SOLN
20.0000 mg | Freq: Once | INTRAMUSCULAR | Status: AC
  Administered 2022-01-01: 20 mg via INTRAVENOUS
  Filled 2022-01-01: qty 2

## 2022-01-01 MED ORDER — MAGNESIUM OXIDE -MG SUPPLEMENT 400 (240 MG) MG PO TABS
800.0000 mg | ORAL_TABLET | Freq: Once | ORAL | Status: AC
  Administered 2022-01-01: 800 mg via ORAL
  Filled 2022-01-01: qty 2

## 2022-01-01 MED ORDER — DOXYCYCLINE HYCLATE 100 MG PO TABS
100.0000 mg | ORAL_TABLET | Freq: Two times a day (BID) | ORAL | Status: DC
  Administered 2022-01-01 – 2022-01-02 (×3): 100 mg via ORAL
  Filled 2022-01-01 (×3): qty 1

## 2022-01-01 MED ORDER — AMIODARONE HCL 200 MG PO TABS
200.0000 mg | ORAL_TABLET | Freq: Every day | ORAL | Status: DC
  Administered 2022-01-01 – 2022-01-02 (×2): 200 mg via ORAL
  Filled 2022-01-01 (×2): qty 1

## 2022-01-01 MED ORDER — CLOPIDOGREL BISULFATE 75 MG PO TABS
75.0000 mg | ORAL_TABLET | Freq: Every day | ORAL | Status: DC
  Administered 2022-01-01 – 2022-01-02 (×2): 75 mg via ORAL
  Filled 2022-01-01 (×2): qty 1

## 2022-01-01 NOTE — Progress Notes (Signed)
Occupational Therapy Treatment Patient Details Name: [name] MRN: [id number] DOB: 03-Oct-1940 Today's Date: 01/01/2022   History of present illness Pt is a 81 y.o. F who presents 12/24/2021 with severe sepsis secondary to left great toe osteomyelitis now s/p 1st toe amputation 12/29/2021. Significant PMH: atrial fibrillation, anemia, HLD, HTN, stroke, PVD.   OT comments  Pt continuing to present with decreased strength, balance, and activity tolerance. Providing pt with education on theraband exercises. Pt performing x10 each for BUEs with yellow theraband. Pt requiring Mod A for power up into standing and demonstrating poor standing tolerance. Pt and husband reporting they are agreeable to post-acute rehab and feel this would be best dc plan for patient. Recommend dc to SNF to optimize safety, strength, and functional performance. Will continue to follow acutely as admitted.   Recommendations for follow up therapy are one component of a multi-disciplinary discharge planning process, led by the attending physician.  Recommendations may be updated based on patient status, additional functional criteria and insurance authorization.    Follow Up Recommendations  Skilled nursing-short term rehab (<3 hours/day)    Assistance Recommended at Discharge Frequent or constant Supervision/Assistance  Patient can return home with the following  A lot of help with bathing/dressing/bathroom;A lot of help with walking and/or transfers   Equipment Recommendations  BSC/3in1    Recommendations for Other Services      Precautions / Restrictions Precautions Precautions: Fall;Other (comment) Precaution Comments: watch HR Required Braces or Orthoses: Other Brace Other Brace: L post op shoe Restrictions Weight Bearing Restrictions: Yes LLE Weight Bearing: Weight bearing as tolerated       Mobility Bed Mobility               General bed mobility comments: In recliner upon arrival     Transfers Overall transfer level: Needs assistance Equipment used: Rolling walker (2 wheels), None Transfers: Sit to/from Stand Sit to Stand: Mod assist           General transfer comment: Heavy Mod A to power up and weight shift forward     Balance Overall balance assessment: Needs assistance Sitting-balance support: Feet supported Sitting balance-Leahy Scale: Good     Standing balance support: Bilateral upper extremity supported, During functional activity Standing balance-Leahy Scale: Poor Standing balance comment: walker and min/mod assist for static standing. Initial mod assist to get weight shifted anteriorly                           ADL either performed or assessed with clinical judgement   ADL Overall ADL's : Needs assistance/impaired                         Toilet Transfer: Moderate assistance;Rolling walker (2 wheels) (sit<>stand at recliner) Toilet Transfer Details (indicate cue type and reason): Mod A for weight shift forward and power up. Very weak         Functional mobility during ADLs: Moderate assistance;Rolling walker (2 wheels) (sit<>stand) General ADL Comments: Focused session on theraband exercises.    Extremity/Trunk Assessment Upper Extremity Assessment Upper Extremity Assessment: Generalized weakness   Lower Extremity Assessment Lower Extremity Assessment: Defer to PT evaluation        Vision       Perception     Praxis      Cognition Arousal/Alertness: Awake/alert Behavior During Therapy: WFL for tasks assessed/performed Overall Cognitive Status: Impaired/Different from baseline Area of Impairment: Problem solving  Problem Solving: Requires verbal cues General Comments: Requiring cues throughout and increased time        Exercises Exercises: General Upper Extremity General Exercises - Upper Extremity Shoulder ABduction: Strengthening, 10 reps, Right, Left,  Seated, Theraband (diagonal) Theraband Level (Shoulder Abduction): Level 1 (Yellow) Shoulder Horizontal ABduction: Strengthening, 10 reps, Both, Seated, Theraband Theraband Level (Shoulder Horizontal Abduction): Level 1 (Yellow) Elbow Flexion: Strengthening, Right, Left, 10 reps, Seated, Theraband Theraband Level (Elbow Flexion): Level 1 (Yellow) Elbow Extension: Strengthening, Right, Left, 10 reps, Seated, Theraband Theraband Level (Elbow Extension): Level 1 (Yellow)    Shoulder Instructions       General Comments HR elevating into 120s during therabanad exercises.    Pertinent Vitals/ Pain       Pain Assessment Pain Assessment: Faces Faces Pain Scale: Hurts a little bit Pain Location: L foot Pain Descriptors / Indicators: Grimacing Pain Intervention(s): Monitored during session, Limited activity within patient's tolerance, Repositioned  Home Living                                          Prior Functioning/Environment              Frequency  Min 2X/week        Progress Toward Goals  OT Goals(current goals can now be found in the care plan section)  Progress towards OT goals: Progressing toward goals  Acute Rehab OT Goals OT Goal Formulation: With patient/family Time For Goal Achievement: 01/13/22 Potential to Achieve Goals: Good ADL Goals Pt Will Perform Grooming: with supervision;with set-up;sitting Pt Will Perform Upper Body Bathing: with supervision;with set-up;sitting Pt Will Perform Lower Body Bathing: with mod assist;sitting/lateral leans;with caregiver independent in assisting Pt Will Perform Upper Body Dressing: with supervision;with set-up;sitting Pt Will Perform Lower Body Dressing: with max assist;with mod assist;sitting/lateral leans;with caregiver independent in assisting Pt Will Transfer to Toilet: with mod assist;stand pivot transfer;bedside commode Pt Will Perform Toileting - Clothing Manipulation and hygiene: with max  assist;with mod assist;sitting/lateral leans;with caregiver independent in assisting  Plan Discharge plan needs to be updated    Co-evaluation                 AM-PAC OT "6 Clicks" Daily Activity     Outcome Measure   Help from another person eating meals?: None Help from another person taking care of personal grooming?: A Little Help from another person toileting, which includes using toliet, bedpan, or urinal?: Total Help from another person bathing (including washing, rinsing, drying)?: A Lot Help from another person to put on and taking off regular upper body clothing?: A Little Help from another person to put on and taking off regular lower body clothing?: Total 6 Click Score: 14    End of Session Equipment Utilized During Treatment: Rolling walker (2 wheels)  OT Visit Diagnosis: Unsteadiness on feet (R26.81);Other abnormalities of gait and mobility (R26.89);Muscle weakness (generalized) (M62.81);Pain Pain - Right/Left: Left Pain - part of body: Ankle and joints of foot   Activity Tolerance Patient tolerated treatment well   Patient Left in chair;with call bell/phone within reach;with chair alarm set   Nurse Communication Mobility status        Time: 5027-7412 OT Time Calculation (min): 14 min  Charges: OT General Charges $OT Visit: 1 Visit OT Treatments $Therapeutic Exercise: 8-22 mins  Marvie Brevik MSOT, OTR/L Acute Rehab Office: [place] 01/01/2022, 5:06 PM

## 2022-01-01 NOTE — Progress Notes (Signed)
Physical Therapy Treatment Patient Details Name: [name] MRN: [id number] DOB: 03-06-1941 Today's Date: 01/01/2022   History of Present Illness Pt is a 81 y.o. F who presents 12/24/2021 with severe sepsis secondary to left great toe osteomyelitis now s/p 1st toe amputation 12/29/2021. Significant PMH: atrial fibrillation, anemia, HLD, HTN, stroke, PVD.    PT Comments    Pt making slow, steady progress with mobility. Pt/husband do not feel they can manage this level of care at home and that pt needs to be more independent prior to return home. Updated dc recommendations to [facility].    Recommendations for follow up therapy are one component of a multi-disciplinary discharge planning process, led by the attending physician.  Recommendations may be updated based on patient status, additional functional criteria and insurance authorization.  Follow Up Recommendations  Skilled nursing-short term rehab (<3 hours/day) Can patient physically be transported by private vehicle: No   Assistance Recommended at Discharge Frequent or constant Supervision/Assistance  Patient can return home with the following A lot of help with walking and/or transfers;A lot of help with bathing/dressing/bathroom;Assistance with cooking/housework   Equipment Recommendations  BSC/3in1    Recommendations for Other Services       Precautions / Restrictions Precautions Precautions: Fall;Other (comment) Precaution Comments: watch HR Required Braces or Orthoses: Other Brace Other Brace: L post op shoe Restrictions Weight Bearing Restrictions: Yes LLE Weight Bearing: Weight bearing as tolerated     Mobility  Bed Mobility Overal bed mobility: Needs Assistance Bed Mobility: Supine to Sit     Supine to sit: Min assist     General bed mobility comments: Assist for pt to pull up on hand to elevate trunk into sitting    Transfers Overall transfer level: Needs assistance Equipment used: Rolling walker (2 wheels),  None Transfers: Sit to/from Stand Sit to Stand: Mod assist           General transfer comment: Heavy assist to bring hips up and shift weight anteriorly. Verbal/tactile cues for hand placement and technique    Ambulation/Gait Ambulation/Gait assistance: Min assist Gait Distance (Feet): 15 Feet (15' x 1, 10' x 1) Assistive device: Rolling walker (2 wheels) Gait Pattern/deviations: Step-to pattern, Decreased step length - right, Decreased step length - left, Trunk flexed Gait velocity: decr Gait velocity interpretation: <1.31 ft/sec, indicative of household ambulator   General Gait Details: Assist with balance and support. Verbal/tactile/visual cues to not step forward past the walker   Stairs             Wheelchair Mobility    Modified Rankin (Stroke Patients Only)       Balance Overall balance assessment: Needs assistance Sitting-balance support: Feet supported Sitting balance-Leahy Scale: Good     Standing balance support: Bilateral upper extremity supported Standing balance-Leahy Scale: Poor Standing balance comment: walker and min/mod assist for static standing. Initial mod assist to get weight shifted anteriorly                            Cognition Arousal/Alertness: Awake/alert Behavior During Therapy: WFL for tasks assessed/performed Overall Cognitive Status: Impaired/Different from baseline Area of Impairment: Problem solving                             Problem Solving: Requires verbal cues          Exercises      General Comments General comments (skin integrity,  edema, etc.): HR to 135 with activity      Pertinent Vitals/Pain Pain Assessment Pain Assessment: Faces Faces Pain Scale: Hurts a little bit Pain Location: L foot Pain Descriptors / Indicators: Grimacing Pain Intervention(s): Monitored during session, Repositioned    Home Living                          Prior Function            PT  Goals (current goals can now be found in the care plan section) Acute Rehab PT Goals Patient Stated Goal: go to rehab to not burden husband Progress towards PT goals: Progressing toward goals    Frequency    Min 2X/week      PT Plan Discharge plan needs to be updated;Frequency needs to be updated    Co-evaluation              AM-PAC PT "6 Clicks" Mobility   Outcome Measure  Help needed turning from your back to your side while in a flat bed without using bedrails?: A Little Help needed moving from lying on your back to sitting on the side of a flat bed without using bedrails?: A Lot Help needed moving to and from a bed to a chair (including a wheelchair)?: A Lot Help needed standing up from a chair using your arms (e.g., wheelchair or bedside chair)?: A Lot Help needed to walk in hospital room?: Total Help needed climbing 3-5 steps with a railing? : Total 6 Click Score: 11    End of Session Equipment Utilized During Treatment: Gait belt;Oxygen Activity Tolerance: Patient tolerated treatment well Patient left: in chair;with call bell/phone within reach;with chair alarm set Nurse Communication: Mobility status PT Visit Diagnosis: Unsteadiness on feet (R26.81);Muscle weakness (generalized) (M62.81);Difficulty in walking, not elsewhere classified (R26.2)     Time: 9024-0973 PT Time Calculation (min) (ACUTE ONLY): 21 min  Charges:  $Gait Training: 8-22 mins                     [place] Office [place] 01/01/2022, 3:08 PM

## 2022-01-01 NOTE — Telephone Encounter (Signed)
-----   Message from [place], [place] sent at 01/01/2022 10:42 AM EDT ----- S/p  left SFA stent, left popliteal DCB, left peroneal angioplasty on 12/19/2021  by Dr. Trula Slade. She needs ABI and LLE arterial duplex around October 12th for her 4-6 post intervention follow up. Okay on PA schedule on day Dr. Trula Slade in office. thanks

## 2022-01-01 NOTE — Plan of Care (Signed)
  Problem: Education: Goal: Understanding of CV disease, CV risk reduction, and recovery process will improve Outcome: Progressing Goal: Individualized Educational Video(s) Outcome: Progressing   Problem: Activity: Goal: Ability to return to baseline activity level will improve Outcome: Progressing   Problem: Cardiovascular: Goal: Ability to achieve and maintain adequate cardiovascular perfusion will improve Outcome: Progressing Goal: Vascular access site(s) Level 0-1 will be maintained Outcome: Progressing   Problem: Health Behavior/Discharge Planning: Goal: Ability to safely manage health-related needs after discharge will improve Outcome: Progressing   Problem: Fluid Volume: Goal: Hemodynamic stability will improve Outcome: Progressing   Problem: Clinical Measurements: Goal: Diagnostic test results will improve Outcome: Progressing Goal: Signs and symptoms of infection will decrease Outcome: Progressing   Problem: Respiratory: Goal: Ability to maintain adequate ventilation will improve Outcome: Progressing   Problem: Education: Goal: Knowledge of General Education information will improve Description: Including pain rating scale, medication(s)/side effects and non-pharmacologic comfort measures Outcome: Progressing   Problem: Health Behavior/Discharge Planning: Goal: Ability to manage health-related needs will improve Outcome: Progressing   Problem: Clinical Measurements: Goal: Ability to maintain clinical measurements within normal limits will improve Outcome: Progressing Goal: Will remain free from infection Outcome: Progressing Goal: Diagnostic test results will improve Outcome: Progressing Goal: Respiratory complications will improve Outcome: Progressing Goal: Cardiovascular complication will be avoided Outcome: Progressing   Problem: Activity: Goal: Risk for activity intolerance will decrease Outcome: Progressing   Problem: Nutrition: Goal: Adequate  nutrition will be maintained Outcome: Progressing   Problem: Coping: Goal: Level of anxiety will decrease Outcome: Progressing   Problem: Elimination: Goal: Will not experience complications related to bowel motility Outcome: Progressing Goal: Will not experience complications related to urinary retention Outcome: Progressing   Problem: Pain Managment: Goal: General experience of comfort will improve Outcome: Progressing   Problem: Safety: Goal: Ability to remain free from injury will improve Outcome: Progressing   Problem: Skin Integrity: Goal: Risk for impaired skin integrity will decrease Outcome: Progressing

## 2022-01-01 NOTE — Progress Notes (Signed)
Mobility Specialist Progress Note   01/01/22 1230  Mobility  Activity Stood at bedside (x5 marches)  Level of Assistance Moderate assist, patient does 50-74%  Assistive Device Front wheel walker  LLE Weight Bearing WBAT  Distance Ambulated (ft) 0 ft  Activity Response Tolerated well  $Mobility charge 1 Mobility   Pre Mobility: 91 HR, 142/87BP, 94% SpO2 on 1LO2 During Mobility: 133 HR, 87% SpO2 on 1.5LO2 Post Mobility: 102 HR, 131/89 BP, SpO2  Received pt in bed having no c/o pain and agreeable. MinG to EOB but ModA to stand d/t general weakness. Mod cues for foot and hand placement throughout session. x3 STS w/ x5 marches on each stand. No LOB during STS, returned back to bed w/o fault, call bell in reach and needs met.     [place] Falling Mobility Specialist MS [facility] #:  [phone] Acute Rehab Office:  [phone]

## 2022-01-01 NOTE — Progress Notes (Signed)
Primary Cardiology:  [name]  Subjective:  Denies SSCP, palpitations or Dyspnea Right foot pain last night   Objective:  Vitals:   12/31/21 1000 12/31/21 1945 01/01/22 0500 01/01/22 0750  BP:  130/66 (!) 149/61 (!) 148/80  Pulse: 84 89 94 85  Resp: 14 16 17 16   Temp: 98.7 F (37.1 C) 98.8 F (37.1 C) 98.6 F (37 C) 98.1 F (36.7 C)  TempSrc: Oral Oral Oral Oral  SpO2: 100% 100% 100% 100%  Weight:      Height:        Intake/Output from previous day: No intake or output data in the 24 hours ending 01/01/22 1000   Physical Exam:  Affect appropriate Frail thin chronically ill white female  HEENT: normal Neck supple with no adenopathy JVP normal bilateral carotid and subclavian  bruits no thyromegaly Lungs clear with no wheezing and good diaphragmatic motion Heart:  S1/S2 no murmur, no rub, gallop or click PMI normal post left mastectomy  Abdomen: benighn, BS positve, no tenderness, no AAA no bruit.  No HSM or HJR Decreased pulse and BP in right arm  No edema Neuro UE tremors left foot drop  Skin bruising over arms  Post left great toe amputation     Lab Results: Basic Metabolic Panel: Recent Labs    12/31/21 0158 01/01/22 0114  NA 141 140  K 3.3* 3.9  CL 114* 113*  CO2 18* 19*  GLUCOSE 91 93  BUN 24* 26*  CREATININE 0.69 0.68  CALCIUM 7.5* 8.0*  MG 1.5* 1.7   Liver Function Tests: No results for input(s): "AST", "ALT", "ALKPHOS", "BILITOT", "PROT", "ALBUMIN" in the last 72 hours. No results for input(s): "LIPASE", "AMYLASE" in the last 72 hours. CBC: Recent Labs    12/30/21 0618 12/31/21 0158 01/01/22 0114  WBC 16.7* 17.9* 16.2*  NEUTROABS 14.6*  --   --   HGB 9.1* 7.8* 8.0*  HCT 27.7* 24.4* 24.7*  MCV 96.5 98.8 98.8  PLT 399 358 355     Imaging: ECHOCARDIOGRAM COMPLETE  Result Date: 12/30/2021    ECHOCARDIOGRAM REPORT   Patient Name:   [name] Date of Exam: 12/30/2021 Medical Rec #:  [id number]    Height:       62.0 in Accession  #:    [id number]   Weight:       111.0 lb Date of Birth:  Jan 17, 1941    BSA:          1.488 m Patient Age:    81 years     BP:           126/74 mmHg Patient Gender: F            HR:           126 bpm. Exam Location:  Inpatient Procedure: 2D Echo, Cardiac Doppler, Color Doppler and Intracardiac            Opacification Agent Indications:    I48.91* Unspeicified atrial fibrillation  History:        Patient has prior history of Echocardiogram examinations, most                 recent 10/12/2009. CAD, Abnormal ECG, COPD and Pulmonary HTN,                 Arrythmias:Atrial Fibrillation and Atrial Flutter,                 Signs/Symptoms:Syncope and Dizziness/Lightheadedness; Risk  Factors:Hypertension and Dyslipidemia. Breast cancer. Lung mass.                 ICA stenosis. Subclavian steal 2008.  Sonographer:    Roseanna Rainbow RDCS Referring Phys: Doreatha Martin [facility]  Sonographer Comments: Technically difficult study due to poor echo windows, suboptimal parasternal window, suboptimal apical window and suboptimal subcostal window. Image acquisition challenging due to respiratory motion. Study difficult due to thin habitus. IMPRESSIONS  1. EF very challenging with HR 126 bpm. Left ventricular ejection fraction, by estimation, is 55 to 60%. The left ventricle has normal function. The left ventricle has no regional wall motion abnormalities. There is mild left ventricular hypertrophy. Left ventricular diastolic parameters are indeterminate.  2. Right ventricular systolic function is normal. The right ventricular size is normal. There is mildly elevated pulmonary artery systolic pressure.  3. Left atrial size was mildly dilated.  4. Mild mitral valve regurgitation.  5. Tricuspid valve regurgitation is mild to moderate.  6. The aortic valve was not well visualized. Aortic valve regurgitation is not visualized.  7. The inferior vena cava is normal in size with greater than 50% respiratory variability, suggesting  right atrial pressure of 3 mmHg. FINDINGS  Left Ventricle: EF very challenging with HR 126 bpm. Left ventricular ejection fraction, by estimation, is 55 to 60%. The left ventricle has normal function. The left ventricle has no regional wall motion abnormalities. Definity contrast agent was given  IV to delineate the left ventricular endocardial borders. The left ventricular internal cavity size was normal in size. There is mild left ventricular hypertrophy. Left ventricular diastolic parameters are indeterminate. Right Ventricle: The right ventricular size is normal. Right vetricular wall thickness was not well visualized. Right ventricular systolic function is normal. There is mildly elevated pulmonary artery systolic pressure. The tricuspid regurgitant velocity  is 2.78 m/s, and with an assumed right atrial pressure of 8 mmHg, the estimated right ventricular systolic pressure is 99.3 mmHg. Left Atrium: Left atrial size was mildly dilated. Right Atrium: Right atrial size was normal in size. Pericardium: There is no evidence of pericardial effusion. Mitral Valve: Mild mitral valve regurgitation. Tricuspid Valve: Tricuspid valve regurgitation is mild to moderate. Aortic Valve: The aortic valve was not well visualized. Aortic valve regurgitation is not visualized. Aortic regurgitation PHT measures 479 msec. Pulmonic Valve: Pulmonic valve regurgitation is not visualized. Aorta: The aortic root and ascending aorta are structurally normal, with no evidence of dilitation. Venous: The inferior vena cava is normal in size with greater than 50% respiratory variability, suggesting right atrial pressure of 3 mmHg. IAS/Shunts: The interatrial septum was not well visualized.  LEFT VENTRICLE PLAX 2D LVIDd:         3.80 cm     Diastology LVIDs:         3.40 cm     LV e' medial:    5.66 cm/s LV PW:         0.80 cm     LV E/e' medial:  15.2 LV IVS:        1.10 cm     LV e' lateral:   5.66 cm/s LVOT diam:     2.00 cm     LV E/e'  lateral: 15.2 LV SV:         38 LV SV Index:   26 LVOT Area:     3.14 cm  LV Volumes (MOD) LV vol d, MOD A2C: 64.1 ml LV vol d, MOD A4C: 52.7 ml LV vol s, MOD  A2C: 35.7 ml LV vol s, MOD A4C: 20.7 ml LV SV MOD A2C:     28.4 ml LV SV MOD A4C:     52.7 ml LV SV MOD BP:      34.0 ml RIGHT VENTRICLE             IVC RV S prime:     10.70 cm/s  IVC diam: 1.90 cm TAPSE (M-mode): 0.7 cm LEFT ATRIUM           Index        RIGHT ATRIUM          Index LA diam:      4.40 cm 2.96 cm/m   RA Area:     7.72 cm LA Vol (A2C): 25.9 ml 17.40 ml/m  RA Volume:   14.00 ml 9.41 ml/m LA Vol (A4C): 53.2 ml 35.74 ml/m  AORTIC VALVE LVOT Vmax:   80.90 cm/s LVOT Vmean:  49.800 cm/s LVOT VTI:    0.121 m AI PHT:      479 msec  AORTA Ao Root diam: 3.30 cm Ao Asc diam:  3.30 cm MITRAL VALVE               TRICUSPID VALVE MV Area (PHT): 7.44 cm    TR Peak grad:   30.9 mmHg MV Decel Time: 102 msec    TR Vmax:        278.00 cm/s MV E velocity: 86.20 cm/s                            SHUNTS                            Systemic VTI:  0.12 m                            Systemic Diam: 2.00 cm Phineas Inches Electronically signed by Phineas Inches Signature Date/Time: 12/30/2021/4:19:20 PM    Final    DG CHEST PORT 1 VIEW  Result Date: 12/30/2021 CLINICAL DATA:  Leukocytosis. EXAM: PORTABLE CHEST 1 VIEW COMPARISON:  December 25, 2021 FINDINGS: Calcific atherosclerotic disease and tortuosity of the aorta. Cardiomediastinal silhouette is normal. Mediastinal contours appear intact. Interval development small bilateral pleural effusions. Streaky bibasilar airspace opacities may represent atelectasis or airspace consolidation. Stable right lung probable scarring. Osseous structures are without acute abnormality. Left chest wall stable postsurgical changes. IMPRESSION: 1. Interval development of small bilateral pleural effusions. 2. Streaky bibasilar airspace opacities may represent atelectasis or airspace consolidation. Electronically Signed   By: Fidela Salisbury M.D.   On: 12/30/2021 15:12    Cardiac Studies:  ECG: afib rate 123 non specific ST changes    Telemetry:  Afib rate 100-120 bpm  Echo: 12/30/21 EF 55-60% mild MR mild/mod TR  Medications:    apixaban  2.5 mg Oral BID   Chlorhexidine Gluconate Cloth  6 each Topical Q0600   docusate sodium  100 mg Oral BID   feeding supplement  1 Container Oral TID BM   metoprolol tartrate  100 mg Oral BID   metroNIDAZOLE  500 mg Oral Q12H   mupirocin ointment  1 Application Nasal BID   nutrition supplement (JUVEN)  1 packet Oral BID BM   pravastatin  40 mg Oral Daily      cefTRIAXone (ROCEPHIN)  IV 2 g (12/31/21 2144)  Assessment/Plan:   PAF:  low dose eliquis resumed post amputation Low dose with age low body weight and previous GI bleeding with gastric/duodenal ulcers CHADVASC 7 add low dose amiodarone today  CVA:  history of with right innominate occlusion and moderate LICA stenosis 90-22% duplex 03/16/21 Resume plavix   Would take BP in left arm   3. PVD:  stent to left SFA balloon of popliteal and peroneal 12/19/21 with amputation of left great toe this       Admission  4. Pulmonary:  history of necrotizing pneumonia CXR small bilateral effusions bibasilar atelectasis Use      IS  5. HLD:  continue statin   Plan is for rehab at [facility] 01/01/2022, 10:00 AM

## 2022-01-01 NOTE — [place] FL2 (Signed)
[place] MEDICAID FL2 LEVEL OF CARE SCREENING TOOL     IDENTIFICATION  Patient Name: [name] Birthdate: 08-06-1940 Sex: female Admission Date (Current Location): 12/24/2021  [facility] and [place] Number:  Herbalist and Address:  [facility], [place] [address], [place], [place] [address]      Provider Number: [id number]  Attending Physician Name and Address:  Damita Lack, MD  Relative Name and Phone Number:  [place], [place] [phone]    Current Level of Care: Hospital Recommended Level of Care: [place] Prior Approval Number:    Date Approved/Denied:   PASRR Number: [id number]  Discharge Plan: SNF    Current Diagnoses: Patient Active Problem List   Diagnosis Date Noted   Atrial flutter with rapid ventricular response ([place])    Severe sepsis ([place]) 12/25/2021   Diarrhea 12/25/2021   Hyperkalemia 12/25/2021   Osteomyelitis of great toe of left foot ([place]) 12/25/2021   PAD (peripheral artery disease) ([place]) 12/19/2021   Osteoporosis    Tibial plateau fracture, left approx. 05/10/17 06/27/2017   COPD, moderate (HCC) 07/12/2015   Multiple gastric ulcers    Coagulopathy (HCC)    Bleeding gastrointestinal    Acute GI bleeding    CAD in native artery    Supratherapeutic INR    Right lower lobe lung mass    Cavitary lesion of lung    AKI (acute kidney injury) ([place])    SIRS (systemic inflammatory response syndrome) (HCC)    Chronic back pain    GI bleed 02/14/2015   Carotid artery disease ([place]) 02/14/2015   Leukocytosis 02/14/2015   Lung nodules 09/29/2014   Smoking history 09/29/2014   Pneumonia with cavity of lung 07/09/2014   Lung mass 07/09/2014   Thrombocytosis ([place]) 05/21/2014   Protein-calorie malnutrition, moderate (HCC) 05/21/2014   Anemia    Generalized weakness    Necrotic pneumonia ([place]) 05/20/2014   Pneumonia 05/20/2014   Elevated liver enzymes 12/16/2013   Tremor 06/24/2013   Encounter for  therapeutic drug monitoring 05/18/2013   Dizziness 01/12/2013   Wound of ankle 10/23/2010   Long term current use of anticoagulant 07/05/2010   Carotid bruit 11/01/2009   Hyperlipemia 10/26/2009   Essential hypertension 10/26/2009   Atrial fibrillation ([place]) 10/26/2009   ACUTE ON CHRONIC DIASTOLIC HEART FAILURE 03/47/4259   SYNCOPE AND COLLAPSE 10/26/2009    Orientation RESPIRATION BLADDER Height & Weight     Self, Time, Situation, Place  O2 Continent Weight: 111 lb (50.3 kg) Height:  5\' 2"  (157.5 cm)  BEHAVIORAL SYMPTOMS/MOOD NEUROLOGICAL BOWEL NUTRITION STATUS      Continent Diet (see discharge summary)  AMBULATORY STATUS COMMUNICATION OF NEEDS Skin   Total Care Verbally Skin abrasions                       Personal Care Assistance Level of Assistance  Bathing, Feeding, Dressing, Total care Bathing Assistance: Maximum assistance Feeding assistance: Independent Dressing Assistance: Maximum assistance Total Care Assistance: Maximum assistance   Functional Limitations Info  Sight, Hearing, Speech Sight Info: Adequate Hearing Info: Adequate Speech Info: Adequate    SPECIAL CARE FACTORS FREQUENCY  PT (By licensed PT), OT (By licensed OT)     PT Frequency: 5x week OT Frequency: 5x week            Contractures Contractures Info: Not present    Additional Factors Info  Code Status, Allergies Code Status Info: full Allergies Info: Iodine, Iohexol, Povidone-iodine,  Tape           Current Medications (01/01/2022):  This is the current hospital active medication list Current Facility-Administered Medications  Medication Dose Route Frequency Provider Last Rate Last Admin   acetaminophen (TYLENOL) tablet 650 mg  650 mg Oral Q6H PRN Felipa Furnace, DPM   650 mg at 01/01/22 1202   Or   acetaminophen (TYLENOL) suppository 650 mg  650 mg Rectal Q6H PRN Felipa Furnace, DPM       albuterol (PROVENTIL) (2.5 MG/3ML) 0.083% nebulizer solution 2.5 mg  2.5 mg Nebulization  Q2H PRN Felipa Furnace, DPM       amiodarone (PACERONE) tablet 200 mg  200 mg Oral Daily Josue Hector, MD   200 mg at 01/01/22 1203   amoxicillin-clavulanate (AUGMENTIN) 875-125 MG per tablet 1 tablet  1 tablet Oral Q12H Vu, Trung T, MD       apixaban (ELIQUIS) tablet 2.5 mg  2.5 mg Oral BID Amin, Ankit Chirag, MD   2.5 mg at 01/01/22 0807   Chlorhexidine Gluconate Cloth 2 % PADS 6 each  6 each Topical Q0600 Damita Lack, MD   6 each at 12/30/21 0600   clopidogrel (PLAVIX) tablet 75 mg  75 mg Oral Daily Josue Hector, MD   75 mg at 01/01/22 1203   docusate sodium (COLACE) capsule 100 mg  100 mg Oral BID Felipa Furnace, DPM   100 mg at 12/30/21 6387   doxycycline (VIBRA-TABS) tablet 100 mg  100 mg Oral Q12H Vu, Trung T, MD   100 mg at 01/01/22 1202   feeding supplement (BOOST / RESOURCE BREEZE) liquid 1 Container  1 Container Oral TID BM Felipa Furnace, DPM   1 Container at 01/01/22 0809   metoprolol tartrate (LOPRESSOR) tablet 100 mg  100 mg Oral BID Felipa Furnace, DPM   100 mg at 01/01/22 5643   mupirocin ointment (BACTROBAN) 2 % 1 Application  1 Application Nasal BID Damita Lack, MD   1 Application at 32/95/18 8416   nutrition supplement (JUVEN) (JUVEN) powder packet 1 packet  1 packet Oral BID BM Felipa Furnace, DPM   1 packet at 01/01/22 0804   ondansetron (ZOFRAN) tablet 4 mg  4 mg Oral Q6H PRN Felipa Furnace, DPM       Or   ondansetron (ZOFRAN) injection 4 mg  4 mg Intravenous Q6H PRN Felipa Furnace, DPM   4 mg at 12/28/21 1241   oxyCODONE (Oxy IR/ROXICODONE) immediate release tablet 5 mg  5 mg Oral Q4H PRN Felipa Furnace, DPM   5 mg at 01/01/22 1201   pravastatin (PRAVACHOL) tablet 40 mg  40 mg Oral Daily Felipa Furnace, DPM   40 mg at 01/01/22 6063     Discharge Medications: Please see discharge summary for a list of discharge medications.  Relevant Imaging Results:  Relevant Lab Results:   Additional Information SSN: [id number]. Pt is vaccinated for covid  with multiple boosters.  Joanne Chars, LCSW

## 2022-01-01 NOTE — Progress Notes (Signed)
PROGRESS NOTE    [name]  KGU:[id number] DOB: 1940-12-17 DOA: 12/24/2021 PCP: Celene Squibb, MD   Brief Narrative:  81 y.o. female with medical history significant of atrial fibrillation, anemia, hyperlipidemia, hypertension, stroke, peripheral vascular disease, and more presented to the ED with a chief complaint of diarrhea.  She is also reported of left foot wound with significant pain, previously started on Bactrim MRI indicated left great toe osteomyelitis, seen by vascular team.  Start broad-spectrum antibiotics.  Diarrhea appears to have resolved.  Podiatry to go to the OR on 9/22 for incisional debridement amputation of the left great toe.  Postop developed atrial flutter therefore cardiology consulted. Due to signs of fluids overload she also received lasix.   Assessment & Plan:  Principal Problem:   Severe sepsis ([place]) Active Problems:   Hyperlipemia   Essential hypertension   Atrial fibrillation (HCC)   Protein-calorie malnutrition, moderate (HCC)   AKI (acute kidney injury) ([place])   Diarrhea   Hyperkalemia   Osteomyelitis of great toe of left foot (HCC)   Atrial flutter with rapid ventricular response (HCC)   Severe sepsis secondary to left great toe osteomyelitis Status post debridement and amputation of left great toe 9/22. - Status post amputation.  Continue to follow culture data.  Currently on Rocephin and Flagyl.  ID following, considering Augmentin and doxycycline. - ID following   Hyperkalemia; resolved -Lokelma given  Paroxysmal atrial fibrillation/flutter (HCC) - Metoprolol 100 mg twice daily.  Received 1 dose of IV digoxin 9/23.  Update echocardiogram-EF 55%.  Seen by cardiology, Eliquis resumed.  Start low-dose amiodarone  Abnormal breath sounds suspicion for slight volume overload Acute diastolic CHF with preserved EF.  EF 55%.  Class III - Still has some signs of fluid overload, will give her 1 dose of IV Lasix.  This should help.  Participate more in  physical therapy. Continue to wean off her oxygen    Diarrhea; resolved -Appears to have resolved.  Initially C. difficile was ordered.   AKI (acute kidney injury) ([place]); resolved -Secondary to dehydration and hypotension.  Creatinine peaked at 1.9.  Cr 0.68   Protein-calorie malnutrition, moderate (HCC) -Dietitian   Constipation Lactulose and prn meds ordered.   History of essential hypertension but presented with severe hypotension -On Metoprolol 100mg  bid   Hyperlipemia: - Continue pravastatin  PT/OT= HH  DVT prophylaxis: SCDs Code Status: Full code Family Communication: None at bedside  Status is: Inpatient A fib now better.  Getting gentle diuretic.  Hopefully she will be able to go home in next 1-2 days if she continues to improve.  I started to continue to wean off her oxygen  Nutritional status    Signs/Symptoms: moderate fat depletion, severe muscle depletion  Interventions: MVI, Boost Breeze, Juven  Body mass index is 20.3 kg/m.         Subjective: Overall patient feels tired and slightly short of breath but better compared to yesterday.  Heart rate is also better controlled.  Examination: Constitutional: Not in acute distress, 2 L nasal cannula.  Elderly frail Respiratory: Bibasilar crackles Cardiovascular: Normal sinus rhythm, no rubs Abdomen: Nontender nondistended good bowel sounds Musculoskeletal: No edema noted Skin: Left foot dressing in place Neurologic: CN 2-12 grossly intact.  And nonfocal Psychiatric: Normal judgment and insight. Alert and oriented x 3. Normal mood.  Objective: Vitals:   12/31/21 1000 12/31/21 1945 01/01/22 0500 01/01/22 0750  BP:  130/66 (!) 149/61 (!) 148/80  Pulse: 84 89 94 85  Resp: 14 16 17 16   Temp: 98.7 F (37.1 C) 98.8 F (37.1 C) 98.6 F (37 C) 98.1 F (36.7 C)  TempSrc: Oral Oral Oral Oral  SpO2: 100% 100% 100% 100%  Weight:      Height:       No intake or output data in the 24 hours ending  01/01/22 0758  Filed Weights   12/24/21 2333  Weight: 50.3 kg     Data Reviewed:   CBC: Recent Labs  Lab 12/26/21 0123 12/27/21 0130 12/28/21 0131 12/29/21 0054 12/30/21 0618 12/31/21 0158 01/01/22 0114  WBC 24.2* 18.7* 14.2* 13.3* 16.7* 17.9* 16.2*  NEUTROABS 20.2* 14.5* 10.2* 9.9* 14.6*  --   --   HGB 8.5* 7.8* 8.6* 7.7* 9.1* 7.8* 8.0*  HCT 25.3* 24.0* 26.9* 23.2* 27.7* 24.4* 24.7*  MCV 95.8 96.8 98.9 96.3 96.5 98.8 98.8  PLT 330 327 363 335 399 358 998   Basic Metabolic Panel: Recent Labs  Lab 12/25/21 1038 12/26/21 0123 12/28/21 0131 12/29/21 0054 12/30/21 0618 12/31/21 0158 01/01/22 0114  NA 134*   < > 139 139 138 141 140  K 4.8   < > 4.7 3.1* 4.6 3.3* 3.9  CL 107   < > 115* 118* 113* 114* 113*  CO2 18*   < > 18* 16* 16* 18* 19*  GLUCOSE 80   < > 87 162* 149* 91 93  BUN 42*   < > 17 16 18  24* 26*  CREATININE 1.94*   < > 0.89 0.71 0.83 0.69 0.68  CALCIUM 7.8*   < > 7.5* 6.6* 7.7* 7.5* 8.0*  MG 2.3  --   --   --   --  1.5* 1.7   < > = values in this interval not displayed.   GFR: Estimated Creatinine Clearance: 42.1 mL/min (by C-G formula based on SCr of 0.68 mg/dL). Liver Function Tests: Recent Labs  Lab 12/25/21 1038  AST 37  ALT 28  ALKPHOS 50  BILITOT 0.6  PROT 5.8*  ALBUMIN 2.5*   No results for input(s): "LIPASE", "AMYLASE" in the last 168 hours.  No results for input(s): "AMMONIA" in the last 168 hours. Coagulation Profile: Recent Labs  Lab 12/25/21 1038  INR 1.4*   Cardiac Enzymes: No results for input(s): "CKTOTAL", "CKMB", "CKMBINDEX", "TROPONINI" in the last 168 hours. BNP (last 3 results) No results for input(s): "PROBNP" in the last 8760 hours. HbA1C: No results for input(s): "HGBA1C" in the last 72 hours. CBG: No results for input(s): "GLUCAP" in the last 168 hours. Lipid Profile: No results for input(s): "CHOL", "HDL", "LDLCALC", "TRIG", "CHOLHDL", "LDLDIRECT" in the last 72 hours. Thyroid Function Tests: No results  for input(s): "TSH", "T4TOTAL", "FREET4", "T3FREE", "THYROIDAB" in the last 72 hours. Anemia Panel: No results for input(s): "VITAMINB12", "FOLATE", "FERRITIN", "TIBC", "IRON", "RETICCTPCT" in the last 72 hours. Sepsis Labs: Recent Labs  Lab 12/25/21 1159 12/26/21 0123 12/27/21 0130  PROCALCITON  --  0.50 0.27  LATICACIDVEN 1.4  --   --     Recent Results (from the past 240 hour(s))  Culture, blood (routine x 2)     Status: None   Collection Time: 12/25/21  1:15 AM   Specimen: BLOOD LEFT HAND  Result Value Ref Range Status   Specimen Description BLOOD LEFT HAND BOTTLES DRAWN AEROBIC ONLY  Final   Special Requests   Final    Blood Culture results may not be optimal due to an inadequate volume of blood received in culture bottles  Culture   Final    NO GROWTH 5 DAYS Performed at [facility], [address], [place], [place] [address]    Report Status 12/30/2021 FINAL  Final  Culture, blood (routine x 2)     Status: None   Collection Time: 12/25/21  1:30 AM   Specimen: Left Antecubital; Blood  Result Value Ref Range Status   Specimen Description   Final    LEFT ANTECUBITAL BOTTLES DRAWN AEROBIC AND ANAEROBIC   Special Requests Blood Culture adequate volume  Final   Culture  Setup Time ANAEROBIC BOTTLE ONLY NO ORGANISMS SEEN   Final   Culture   Final    NO GROWTH 5 DAYS Performed at [facility], [address] [place], [place] [address]    Report Status 12/30/2021 FINAL  Final  Surgical pcr screen     Status: Abnormal   Collection Time: 12/29/21 12:14 PM   Specimen: Nasal Mucosa; Nasal Swab  Result Value Ref Range Status   MRSA, PCR POSITIVE (A) NEGATIVE Final    Comment: RESULT CALLED TO, READ BACK BY AND VERIFIED WITH: 12/29/21 1430 RN KIANG    Staphylococcus aureus POSITIVE (A) NEGATIVE Final    Comment: (NOTE) The Xpert SA Assay (FDA approved for NASAL specimens in patients 71 years of age and older), is one component of a comprehensive surveillance program. It  is not intended to diagnose infection nor to guide or monitor treatment. Performed at [facility], [place] [address], [place],  [address]          Radiology Studies: ECHOCARDIOGRAM COMPLETE  Result Date: 12/30/2021    ECHOCARDIOGRAM REPORT   Patient Name:   KYRENE LONGAN Date of Exam: 12/30/2021 Medical Rec #:  [id number]    Height:       62.0 in Accession #:    [id number]   Weight:       111.0 lb Date of Birth:  [birth year]    BSA:          1.488 m Patient Age:    10 years     BP:           126/74 mmHg Patient Gender: F            HR:           126 bpm. Exam Location:  Inpatient Procedure: 2D Echo, Cardiac Doppler, Color Doppler and Intracardiac            Opacification Agent Indications:    I48.91* Unspeicified atrial fibrillation  History:        Patient has prior history of Echocardiogram examinations, most                 recent 10/12/2009. CAD, Abnormal ECG, COPD and Pulmonary HTN,                 Arrythmias:Atrial Fibrillation and Atrial Flutter,                 Signs/Symptoms:Syncope and Dizziness/Lightheadedness; Risk                 Factors:Hypertension and Dyslipidemia. Breast cancer. Lung mass.                 ICA stenosis. Subclavian steal 2008.  Sonographer:    Roseanna Rainbow RDCS Referring Phys: Doreatha Martin [facility]  Sonographer Comments: Technically difficult study due to poor echo windows, suboptimal parasternal window, suboptimal apical window and suboptimal subcostal window. Image acquisition challenging due to respiratory  motion. Study difficult due to thin habitus. IMPRESSIONS  1. EF very challenging with HR 126 bpm. Left ventricular ejection fraction, by estimation, is 55 to 60%. The left ventricle has normal function. The left ventricle has no regional wall motion abnormalities. There is mild left ventricular hypertrophy. Left ventricular diastolic parameters are indeterminate.  2. Right ventricular systolic function is normal. The right ventricular size is normal. There is  mildly elevated pulmonary artery systolic pressure.  3. Left atrial size was mildly dilated.  4. Mild mitral valve regurgitation.  5. Tricuspid valve regurgitation is mild to moderate.  6. The aortic valve was not well visualized. Aortic valve regurgitation is not visualized.  7. The inferior vena cava is normal in size with greater than 50% respiratory variability, suggesting right atrial pressure of 3 mmHg. FINDINGS  Left Ventricle: EF very challenging with HR 126 bpm. Left ventricular ejection fraction, by estimation, is 55 to 60%. The left ventricle has normal function. The left ventricle has no regional wall motion abnormalities. Definity contrast agent was given  IV to delineate the left ventricular endocardial borders. The left ventricular internal cavity size was normal in size. There is mild left ventricular hypertrophy. Left ventricular diastolic parameters are indeterminate. Right Ventricle: The right ventricular size is normal. Right vetricular wall thickness was not well visualized. Right ventricular systolic function is normal. There is mildly elevated pulmonary artery systolic pressure. The tricuspid regurgitant velocity  is 2.78 m/s, and with an assumed right atrial pressure of 8 mmHg, the estimated right ventricular systolic pressure is 27.7 mmHg. Left Atrium: Left atrial size was mildly dilated. Right Atrium: Right atrial size was normal in size. Pericardium: There is no evidence of pericardial effusion. Mitral Valve: Mild mitral valve regurgitation. Tricuspid Valve: Tricuspid valve regurgitation is mild to moderate. Aortic Valve: The aortic valve was not well visualized. Aortic valve regurgitation is not visualized. Aortic regurgitation PHT measures 479 msec. Pulmonic Valve: Pulmonic valve regurgitation is not visualized. Aorta: The aortic root and ascending aorta are structurally normal, with no evidence of dilitation. Venous: The inferior vena cava is normal in size with greater than 50%  respiratory variability, suggesting right atrial pressure of 3 mmHg. IAS/Shunts: The interatrial septum was not well visualized.  LEFT VENTRICLE PLAX 2D LVIDd:         3.80 cm     Diastology LVIDs:         3.40 cm     LV e' medial:    5.66 cm/s LV PW:         0.80 cm     LV E/e' medial:  15.2 LV IVS:        1.10 cm     LV e' lateral:   5.66 cm/s LVOT diam:     2.00 cm     LV E/e' lateral: 15.2 LV SV:         38 LV SV Index:   26 LVOT Area:     3.14 cm  LV Volumes (MOD) LV vol d, MOD A2C: 64.1 ml LV vol d, MOD A4C: 52.7 ml LV vol s, MOD A2C: 35.7 ml LV vol s, MOD A4C: 20.7 ml LV SV MOD A2C:     28.4 ml LV SV MOD A4C:     52.7 ml LV SV MOD BP:      34.0 ml RIGHT VENTRICLE             IVC RV S prime:     10.70 cm/s  IVC diam:  1.90 cm TAPSE (M-mode): 0.7 cm LEFT ATRIUM           Index        RIGHT ATRIUM          Index LA diam:      4.40 cm 2.96 cm/m   RA Area:     7.72 cm LA Vol (A2C): 25.9 ml 17.40 ml/m  RA Volume:   14.00 ml 9.41 ml/m LA Vol (A4C): 53.2 ml 35.74 ml/m  AORTIC VALVE LVOT Vmax:   80.90 cm/s LVOT Vmean:  49.800 cm/s LVOT VTI:    0.121 m AI PHT:      479 msec  AORTA Ao Root diam: 3.30 cm Ao Asc diam:  3.30 cm MITRAL VALVE               TRICUSPID VALVE MV Area (PHT): 7.44 cm    TR Peak grad:   30.9 mmHg MV Decel Time: 102 msec    TR Vmax:        278.00 cm/s MV E velocity: 86.20 cm/s                            SHUNTS                            Systemic VTI:  0.12 m                            Systemic Diam: 2.00 cm Phineas Inches Electronically signed by Phineas Inches Signature Date/Time: 12/30/2021/4:19:20 PM    Final    DG CHEST PORT 1 VIEW  Result Date: 12/30/2021 CLINICAL DATA:  Leukocytosis. EXAM: PORTABLE CHEST 1 VIEW COMPARISON:  December 25, 2021 FINDINGS: Calcific atherosclerotic disease and tortuosity of the aorta. Cardiomediastinal silhouette is normal. Mediastinal contours appear intact. Interval development small bilateral pleural effusions. Streaky bibasilar airspace opacities may  represent atelectasis or airspace consolidation. Stable right lung probable scarring. Osseous structures are without acute abnormality. Left chest wall stable postsurgical changes. IMPRESSION: 1. Interval development of small bilateral pleural effusions. 2. Streaky bibasilar airspace opacities may represent atelectasis or airspace consolidation. Electronically Signed   By: Fidela Salisbury M.D.   On: 12/30/2021 15:12        Scheduled Meds:  apixaban  2.5 mg Oral BID   Chlorhexidine Gluconate Cloth  6 each Topical Q0600   docusate sodium  100 mg Oral BID   feeding supplement  1 Container Oral TID BM   magnesium oxide  800 mg Oral Once   metoprolol tartrate  100 mg Oral BID   metroNIDAZOLE  500 mg Oral Q12H   mupirocin ointment  1 Application Nasal BID   nutrition supplement (JUVEN)  1 packet Oral BID BM   potassium chloride  20 mEq Oral Once   pravastatin  40 mg Oral Daily   Continuous Infusions:  cefTRIAXone (ROCEPHIN)  IV 2 g (12/31/21 2144)     LOS: 7 days   Time spent= 35 mins    Yarrow Linhart Arsenio Loader, MD [facility]  If 7PM-7AM, please contact night-coverage  01/01/2022, 7:58 AM

## 2022-01-01 NOTE — Progress Notes (Signed)
[facility] for Infectious Disease  Date of Admission:  12/24/2021     Total days of antibiotics 9         ASSESSMENT:  [name] is POD #3 from left great toe amputation and incisional debridement of the left foot. Continues to have appropriate pain. Pathology report focal acute osteomyelitis. Appears source control of osteomyelitis achieved with amputation. Will change antibiotics to doxycycline and Augmentin for 10 days from surgery starting on 9/22 placing end of treatment on 10/2. Continue wound care and follow up per Podiatry recommendations.  Remaining medical and supportive care per primary team.   PLAN:  Change antibiotics to doxycycline and Augmentin through 01/08/22. Wound care and follow up per Podiatry.  Remaining medical and supportive care per primary team.   ID will sign off.   Principal Problem:   Severe sepsis (HCC) Active Problems:   Hyperlipemia   Essential hypertension   Atrial fibrillation (HCC)   Protein-calorie malnutrition, moderate (HCC)   AKI (acute kidney injury) ([place])   Diarrhea   Hyperkalemia   Osteomyelitis of great toe of left foot (HCC)   Atrial flutter with rapid ventricular response (HCC)    amiodarone  200 mg Oral Daily   amoxicillin-clavulanate  1 tablet Oral Q12H   apixaban  2.5 mg Oral BID   Chlorhexidine Gluconate Cloth  6 each Topical Q0600   clopidogrel  75 mg Oral Daily   docusate sodium  100 mg Oral BID   doxycycline  100 mg Oral Q12H   feeding supplement  1 Container Oral TID BM   metoprolol tartrate  100 mg Oral BID   mupirocin ointment  1 Application Nasal BID   nutrition supplement (JUVEN)  1 packet Oral BID BM   pravastatin  40 mg Oral Daily    SUBJECTIVE:  Afebrile overnight with no acute events. Doing well with occasional pain.   Allergies  Allergen Reactions   Iodine Rash and Other (See Comments)    BETADINE Rash/burning, blisters on skin. Burns skin   Iohexol Rash and Other (See Comments)    Blisters;  PT NEEDS 13-HOUR PREP    Povidone-Iodine Other (See Comments)    Burns skin   Tape Itching, Rash and Other (See Comments)    "DO NOT USE ADHESIVE TAPE" Not even a Band Aid" NO PAPER TAPE Burns skin Skin is very very thin     Review of Systems: Review of Systems  Constitutional:  Negative for chills, fever and weight loss.  Respiratory:  Negative for cough, shortness of breath and wheezing.   Cardiovascular:  Negative for chest pain and leg swelling.  Gastrointestinal:  Negative for abdominal pain, constipation, diarrhea, nausea and vomiting.  Musculoskeletal:        Occasional post-op pain  Skin:  Negative for rash.      OBJECTIVE: Vitals:   12/31/21 1945 01/01/22 0500 01/01/22 0750 01/01/22 1331  BP: 130/66 (!) 149/61 (!) 148/80 124/69  Pulse: 89 94 85 92  Resp: 16 17 16 11   Temp: 98.8 F (37.1 C) 98.6 F (37 C) 98.1 F (36.7 C) 98 F (36.7 C)  TempSrc: Oral Oral Oral Oral  SpO2: 100% 100% 100% 96%  Weight:      Height:       Body mass index is 20.3 kg/m.  Physical Exam Constitutional:      General: She is not in acute distress.    Appearance: She is well-developed.     Comments: Lying in bed; pleasant.  Cardiovascular:     Rate and Rhythm: Normal rate and regular rhythm.     Heart sounds: Normal heart sounds.  Pulmonary:     Effort: Pulmonary effort is normal.     Breath sounds: Normal breath sounds.  Musculoskeletal:     Comments: Surgical dressing is clean and dry.   Skin:    General: Skin is warm and dry.  Neurological:     Mental Status: She is alert and oriented to person, place, and time.     Lab Results Lab Results  Component Value Date   WBC 16.2 (H) 01/01/2022   HGB 8.0 (L) 01/01/2022   HCT 24.7 (L) 01/01/2022   MCV 98.8 01/01/2022   PLT 355 01/01/2022    Lab Results  Component Value Date   CREATININE 0.68 01/01/2022   BUN 26 (H) 01/01/2022   NA 140 01/01/2022   K 3.9 01/01/2022   CL 113 (H) 01/01/2022   CO2 19 (L) 01/01/2022     Lab Results  Component Value Date   ALT 28 12/25/2021   AST 37 12/25/2021   ALKPHOS 50 12/25/2021   BILITOT 0.6 12/25/2021     Microbiology: Recent Results (from the past 240 hour(s))  Culture, blood (routine x 2)     Status: None   Collection Time: 12/25/21  1:15 AM   Specimen: BLOOD LEFT HAND  Result Value Ref Range Status   Specimen Description BLOOD LEFT HAND BOTTLES DRAWN AEROBIC ONLY  Final   Special Requests   Final    Blood Culture results may not be optimal due to an inadequate volume of blood received in culture bottles   Culture   Final    NO GROWTH 5 DAYS Performed at [facility], [address], [place], [place] [address]    Report Status 12/30/2021 FINAL  Final  Culture, blood (routine x 2)     Status: None   Collection Time: 12/25/21  1:30 AM   Specimen: Left Antecubital; Blood  Result Value Ref Range Status   Specimen Description   Final    LEFT ANTECUBITAL BOTTLES DRAWN AEROBIC AND ANAEROBIC   Special Requests Blood Culture adequate volume  Final   Culture  Setup Time ANAEROBIC BOTTLE ONLY NO ORGANISMS SEEN   Final   Culture   Final    NO GROWTH 5 DAYS Performed at [facility], [address] [place], [place] [address]    Report Status 12/30/2021 FINAL  Final  Surgical pcr screen     Status: Abnormal   Collection Time: 12/29/21 12:14 PM   Specimen: Nasal Mucosa; Nasal Swab  Result Value Ref Range Status   MRSA, PCR POSITIVE (A) NEGATIVE Final    Comment: RESULT CALLED TO, READ BACK BY AND VERIFIED WITH: 12/29/21 1430 RN KIANG    Staphylococcus aureus POSITIVE (A) NEGATIVE Final    Comment: (NOTE) The Xpert SA Assay (FDA approved for NASAL specimens in patients 72 years of age and older), is one component of a comprehensive surveillance program. It is not intended to diagnose infection nor to guide or monitor treatment. Performed at [facility], [place] [address], [place], [place] [address]      [name], [place] for  Infectious Disease [facility]  01/01/2022  3:05 PM

## 2022-01-01 NOTE — TOC Progression Note (Addendum)
Transition of Care [facility]) - Progression Note    Patient Details  Name: [name] MRN: [id number] Date of Birth: 11/13/1940  Transition of Care [facility]) CM/SW Contact  Joanne Chars, LCSW Phone Number: 01/01/2022, 12:21 PM  Clinical Narrative:   CSW spoke with pt and husband [name], they are requesting discharge to SNF, [facility] is [facility], would be willing to accept offer at [facility] as well.  [employer].  Pt has been vaccinated for covid with multiple boosters.  Referral sent out in hub.    1600: choice document given to pt along with bed offer at [facility].  [place] full.  Pt will discuss with husband.    Expected Discharge Plan: [place] Barriers to Discharge: Continued Medical Work up  Expected Discharge Plan and Services Expected Discharge Plan: [place]   Discharge Planning Services: CM Consult Post Acute Care Choice: Home Health, Durable Medical Equipment Living arrangements for the past 2 months: Single Family Home                 DME Arranged: 3-N-1 DME Agency: [employer] Date DME Agency Contacted: 12/31/21 Time DME Agency Contacted: [phone] Representative spoke with at DME Agency: [place]: PT [place]: [facility] (now known as [facility]) Date [place]: 12/31/21 Time [place]: 50 Representative spoke with at [place]: [place] Determinants of Health ([place]) Interventions    Readmission Risk Interventions     No data to display

## 2022-01-02 ENCOUNTER — Telehealth: Payer: Self-pay

## 2022-01-02 ENCOUNTER — Telehealth: Payer: Self-pay | Admitting: Podiatry

## 2022-01-02 DIAGNOSIS — I480 Paroxysmal atrial fibrillation: Secondary | ICD-10-CM | POA: Diagnosis not present

## 2022-01-02 DIAGNOSIS — N179 Acute kidney failure, unspecified: Secondary | ICD-10-CM | POA: Diagnosis not present

## 2022-01-02 DIAGNOSIS — M869 Osteomyelitis, unspecified: Secondary | ICD-10-CM | POA: Diagnosis not present

## 2022-01-02 DIAGNOSIS — A419 Sepsis, unspecified organism: Secondary | ICD-10-CM | POA: Diagnosis not present

## 2022-01-02 LAB — CBC
HCT: 24.9 % — ABNORMAL LOW (ref 36.0–46.0)
Hemoglobin: 8.3 g/dL — ABNORMAL LOW (ref 12.0–15.0)
MCH: 32.4 pg (ref 26.0–34.0)
MCHC: 33.3 g/dL (ref 30.0–36.0)
MCV: 97.3 fL (ref 80.0–100.0)
Platelets: 356 10*3/uL (ref 150–400)
RBC: 2.56 MIL/uL — ABNORMAL LOW (ref 3.87–5.11)
RDW: 16.8 % — ABNORMAL HIGH (ref 11.5–15.5)
WBC: 15 10*3/uL — ABNORMAL HIGH (ref 4.0–10.5)
nRBC: 0.5 % — ABNORMAL HIGH (ref 0.0–0.2)

## 2022-01-02 LAB — BASIC METABOLIC PANEL
Anion gap: 6 (ref 5–15)
BUN: 25 mg/dL — ABNORMAL HIGH (ref 8–23)
CO2: 22 mmol/L (ref 22–32)
Calcium: 8.1 mg/dL — ABNORMAL LOW (ref 8.9–10.3)
Chloride: 110 mmol/L (ref 98–111)
Creatinine, Ser: 0.74 mg/dL (ref 0.44–1.00)
GFR, Estimated: >60 mL/min (ref >60)
Glucose, Bld: 100 mg/dL — ABNORMAL HIGH (ref 70–99)
Potassium: 3.4 mmol/L — ABNORMAL LOW (ref 3.5–5.1)
Sodium: 138 mmol/L (ref 135–145)

## 2022-01-02 LAB — MAGNESIUM: Magnesium: 1.7 mg/dL (ref 1.7–2.4)

## 2022-01-02 MED ORDER — POTASSIUM CHLORIDE CRYS ER 20 MEQ PO TBCR
40.0000 meq | EXTENDED_RELEASE_TABLET | Freq: Once | ORAL | Status: AC
  Administered 2022-01-02: 40 meq via ORAL
  Filled 2022-01-02: qty 2

## 2022-01-02 MED ORDER — DOXYCYCLINE HYCLATE 100 MG PO TABS: 100.0000 mg | ORAL_TABLET | Freq: Two times a day (BID) | ORAL | 0 refills | Status: AC

## 2022-01-02 MED ORDER — ACETAMINOPHEN 325 MG PO TABS: 650.0000 mg | ORAL_TABLET | Freq: Four times a day (QID) | ORAL | Status: DC | PRN

## 2022-01-02 MED ORDER — LISINOPRIL 5 MG PO TABS: 5.0000 mg | ORAL_TABLET | Freq: Every day | ORAL | Status: DC

## 2022-01-02 MED ORDER — FUROSEMIDE 20 MG PO TABS: 20.0000 mg | ORAL_TABLET | Freq: Every day | ORAL | Status: DC

## 2022-01-02 MED ORDER — OXYCODONE HCL 5 MG PO TABS: 5.0000 mg | ORAL_TABLET | Freq: Four times a day (QID) | ORAL | 0 refills | Status: DC | PRN

## 2022-01-02 MED ORDER — DOCUSATE SODIUM 100 MG PO CAPS: 100.0000 mg | ORAL_CAPSULE | Freq: Two times a day (BID) | ORAL | 0 refills | Status: DC

## 2022-01-02 MED ORDER — AMIODARONE HCL 200 MG PO TABS: 200.0000 mg | ORAL_TABLET | Freq: Every day | ORAL | Status: DC

## 2022-01-02 MED ORDER — AMOXICILLIN-POT CLAVULANATE 875-125 MG PO TABS: 1.0000 | ORAL_TABLET | Freq: Two times a day (BID) | ORAL | 0 refills | Status: AC

## 2022-01-02 MED ORDER — ONDANSETRON HCL 4 MG PO TABS: 4.0000 mg | ORAL_TABLET | Freq: Four times a day (QID) | ORAL | 0 refills | Status: DC | PRN

## 2022-01-02 NOTE — Telephone Encounter (Signed)
Patient husband called he needs instructions sent to [facility] on [address] in [place]  on how to take care of her foot. He also needs [place] to reach out to them and get her appointment arranged for follow up because he cant transport her from there.    Please call  [phone] is phone number

## 2022-01-02 NOTE — Progress Notes (Signed)
Nutrition Follow-up  DOCUMENTATION CODES:   Non-severe (moderate) malnutrition in context of chronic illness  INTERVENTION:  -Continue current diet order, encourage protein in each meal -Continue Boost Breeze BID (peach if available) -Continue Juven BID  NUTRITION DIAGNOSIS:  Moderate Malnutrition related to chronic illness (critical limb ischemia) as evidenced by moderate fat depletion, severe muscle depletion.  GOAL:  Patient will meet greater than or equal to 90% of their needs  MONITOR:  PO intake, Supplement acceptance, Labs, Diet advancement  REASON FOR ASSESSMENT:  Follow Up po intake  ASSESSMENT:  Pt admitted with diarrhea. PMH significant for afib, anemia, HLD, HTN, stroke, PVD.  Visited pt at bedside this morning. She reports she is trying to eat more but doesn't do well with heavy meals. Discussed the importance of protein for muscle mass and healing. Pt asked about eating just fruit at lunch time. Suggested adding greek yogurt or cottage cheese with fruit, pt agreeable. Discussed other protein sources available during admission. Pt reports husband is encouraging her to eat as well. Seems to have good family support. Pt reports she enjoys the [employer], continue ONS. Documentation shows she is also accepting the [place]. She has no further questions/concerns at this time.   Labs reviewed: K:3.4, BG:100, BUN:25  Medications reviewed and include: eliquis, plavix, colace, boost breeze, juven, lopressor  Diet Order:   Diet Order             Diet Carb Modified Fluid consistency: Thin; Room service appropriate? Yes  Diet effective now                   EDUCATION NEEDS:  Education needs have been addressed  Skin:  Skin Assessment: Reviewed RN Assessment  Last BM:  9/24  Height:   Ht Readings from Last 1 Encounters:  12/24/21 5\' 2"  (1.575 m)   Weight:  Wt Readings from Last 1 Encounters:  12/24/21 50.3 kg   BMI:  Body mass index is 20.3  kg/m.  Estimated Nutritional Needs:   Kcal:  1300-1500  Protein:  70-85g  Fluid:  >/=1.5L  Candise Bowens, MS, RD, LDN, CNSC See AMiON for contact information

## 2022-01-02 NOTE — Plan of Care (Signed)
  Problem: Respiratory: Goal: Ability to maintain adequate ventilation will improve Outcome: Progressing   Problem: Education: Goal: Knowledge of General Education information will improve Description: Including pain rating scale, medication(s)/side effects and non-pharmacologic comfort measures Outcome: Progressing   Problem: Health Behavior/Discharge Planning: Goal: Ability to manage health-related needs will improve Outcome: Progressing

## 2022-01-02 NOTE — TOC Progression Note (Signed)
Transition of Care [facility]) - Progression Note    Patient Details  Name: [name] MRN: [id number] Date of Birth: 08-27-1940  Transition of Care [facility]) CM/SW Contact  Joanne Chars, LCSW Phone Number: 01/02/2022, 9:41 AM  Clinical Narrative:   CSW spoke with pt who confirms she would like to accept offer at [facility].    CSW reached out to Sh[facility] to inform them and request they start insurance auth.     Expected Discharge Plan: [place] Barriers to Discharge: Continued Medical Work up  Expected Discharge Plan and Services Expected Discharge Plan: [place]   Discharge Planning Services: CM Consult Post Acute Care Choice: Home Health, Durable Medical Equipment Living arrangements for the past 2 months: Single Family Home                 DME Arranged: 3-N-1 DME Agency: [employer] Date DME Agency Contacted: 12/31/21 Time DME Agency Contacted: [phone] Representative spoke with at DME Agency: [place]: PT [place]: [facility] (now known as [facility]) Date [place]: 12/31/21 Time [place]: 35 Representative spoke with at [place]: [place] Determinants of Health ([place]) Interventions    Readmission Risk Interventions     No data to display

## 2022-01-02 NOTE — TOC Transition Note (Signed)
Transition of Care [facility]) - CM/SW Discharge Note   Patient Details  Name: [name] MRN: [id number] Date of Birth: 1941/03/19  Transition of Care [facility]) CM/SW Contact:  Colleen Chars, LCSW Phone Number: 01/02/2022, 3:06 PM   Clinical Narrative:   Pt discharging to [facility].  RN call report to [phone].     Final next level of care: Skilled Nursing Facility Barriers to Discharge: Barriers Resolved   Patient Goals and CMS Choice Patient states their goals for this hospitalization and ongoing recovery are:: home with husband CMS Medicare.gov Compare Post Acute Care list provided to:: Patient Choice offered to / list presented to : Patient, Spouse  Discharge Placement              Patient chooses bed at:  [facility] rehab) Patient to be transferred to facility by: [place] Name of family member notified: husband John Patient and family notified of of transfer: 01/02/22  Discharge Plan and Services   Discharge Planning Services: CM Consult Post Acute Care Choice: Home Health, Durable Medical Equipment          DME Arranged: 3-N-1 DME Agency: [employer] Date DME Agency Contacted: 12/31/21 Time DME Agency Contacted: [phone] Representative spoke with at DME Agency: [place]: PT [place]: [facility] (now known as [facility]) Date [place]: 12/31/21 Time [place]: 80 Representative spoke with at [place]: [place] Determinants of Health ([place]) Interventions     Readmission Risk Interventions     No data to display

## 2022-01-02 NOTE — Care Management Important Message (Signed)
Important Message  Patient Details  Name: [name] MRN: [id number] Date of Birth: Jun 19, 1940   Medicare Important Message Given:  Yes     Hannah Beat 01/02/2022, 4:01 PM

## 2022-01-02 NOTE — Plan of Care (Signed)
Problem: Education: Goal: Understanding of CV disease, CV risk reduction, and recovery process will improve 01/02/2022 0751 by Bridgette Habermann, RN Outcome: Progressing 01/01/2022 2024 by Bridgette Habermann, RN Outcome: Progressing Goal: Individualized Educational Video(s) 01/02/2022 0751 by Bridgette Habermann, RN Outcome: Progressing 01/01/2022 2024 by Bridgette Habermann, RN Outcome: Progressing   Problem: Activity: Goal: Ability to return to baseline activity level will improve 01/02/2022 0751 by Bridgette Habermann, RN Outcome: Progressing 01/01/2022 2024 by Bridgette Habermann, RN Outcome: Progressing   Problem: Cardiovascular: Goal: Ability to achieve and maintain adequate cardiovascular perfusion will improve 01/02/2022 0751 by Bridgette Habermann, RN Outcome: Progressing 01/01/2022 2024 by Bridgette Habermann, RN Outcome: Progressing Goal: Vascular access site(s) Level 0-1 will be maintained 01/02/2022 0751 by Bridgette Habermann, RN Outcome: Progressing 01/01/2022 2024 by Bridgette Habermann, RN Outcome: Progressing   Problem: Health Behavior/Discharge Planning: Goal: Ability to safely manage health-related needs after discharge will improve 01/02/2022 0751 by Bridgette Habermann, RN Outcome: Progressing 01/01/2022 2024 by Bridgette Habermann, RN Outcome: Progressing   Problem: Fluid Volume: Goal: Hemodynamic stability will improve 01/02/2022 0751 by Bridgette Habermann, RN Outcome: Progressing 01/01/2022 2024 by Bridgette Habermann, RN Outcome: Progressing   Problem: Clinical Measurements: Goal: Diagnostic test results will improve 01/02/2022 0751 by Bridgette Habermann, RN Outcome: Progressing 01/01/2022 2024 by Bridgette Habermann, RN Outcome: Progressing Goal: Signs and symptoms of infection will decrease 01/02/2022 0751 by Bridgette Habermann, RN Outcome: Progressing 01/01/2022 2024 by Bridgette Habermann, RN Outcome: Progressing   Problem: Respiratory: Goal: Ability to maintain adequate ventilation will  improve 01/02/2022 0751 by Bridgette Habermann, RN Outcome: Progressing 01/01/2022 2024 by Bridgette Habermann, RN Outcome: Progressing   Problem: Education: Goal: Knowledge of General Education information will improve Description: Including pain rating scale, medication(s)/side effects and non-pharmacologic comfort measures 01/02/2022 0751 by Bridgette Habermann, RN Outcome: Progressing 01/01/2022 2024 by Bridgette Habermann, RN Outcome: Progressing   Problem: Health Behavior/Discharge Planning: Goal: Ability to manage health-related needs will improve 01/02/2022 0751 by Bridgette Habermann, RN Outcome: Progressing 01/01/2022 2024 by Bridgette Habermann, RN Outcome: Progressing   Problem: Clinical Measurements: Goal: Ability to maintain clinical measurements within normal limits will improve 01/02/2022 0751 by Bridgette Habermann, RN Outcome: Progressing 01/01/2022 2024 by Bridgette Habermann, RN Outcome: Progressing Goal: Will remain free from infection 01/02/2022 0751 by Bridgette Habermann, RN Outcome: Progressing 01/01/2022 2024 by Bridgette Habermann, RN Outcome: Progressing Goal: Diagnostic test results will improve 01/02/2022 0751 by Bridgette Habermann, RN Outcome: Progressing 01/01/2022 2024 by Bridgette Habermann, RN Outcome: Progressing Goal: Respiratory complications will improve 01/02/2022 0751 by Bridgette Habermann, RN Outcome: Progressing 01/01/2022 2024 by Bridgette Habermann, RN Outcome: Progressing Goal: Cardiovascular complication will be avoided 01/02/2022 0751 by Bridgette Habermann, RN Outcome: Progressing 01/01/2022 2024 by Bridgette Habermann, RN Outcome: Progressing   Problem: Activity: Goal: Risk for activity intolerance will decrease 01/02/2022 0751 by Bridgette Habermann, RN Outcome: Progressing 01/01/2022 2024 by Bridgette Habermann, RN Outcome: Progressing   Problem: Nutrition: Goal: Adequate nutrition will be maintained 01/02/2022 0751 by Bridgette Habermann, RN Outcome: Progressing 01/01/2022 2024  by Bridgette Habermann, RN Outcome: Progressing   Problem: Coping: Goal: Level of anxiety will decrease 01/02/2022 0751 by Bridgette Habermann, RN Outcome: Progressing 01/01/2022 2024 by Bridgette Habermann, RN Outcome: Progressing   Problem: Elimination: Goal: Will not experience complications related to bowel motility 01/02/2022 0751 by  Bridgette Habermann, RN Outcome: Progressing 01/01/2022 2024 by Bridgette Habermann, RN Outcome: Progressing Goal: Will not experience complications related to urinary retention 01/02/2022 0751 by Bridgette Habermann, RN Outcome: Progressing 01/01/2022 2024 by Bridgette Habermann, RN Outcome: Progressing   Problem: Pain Managment: Goal: General experience of comfort will improve 01/02/2022 0751 by Bridgette Habermann, RN Outcome: Progressing 01/01/2022 2024 by Bridgette Habermann, RN Outcome: Progressing   Problem: Safety: Goal: Ability to remain free from injury will improve 01/02/2022 0751 by Bridgette Habermann, RN Outcome: Progressing 01/01/2022 2024 by Bridgette Habermann, RN Outcome: Progressing   Problem: Skin Integrity: Goal: Risk for impaired skin integrity will decrease 01/02/2022 0751 by Bridgette Habermann, RN Outcome: Progressing 01/01/2022 2024 by Bridgette Habermann, RN Outcome: Progressing

## 2022-01-02 NOTE — Discharge Summary (Signed)
Physician Discharge Summary   Patient: [name] MRN: [id number] DOB: [birth year]  Admit date:     12/24/2021  Discharge date: 01/02/22  Discharge Physician: Oswald Hillock   PCP: Celene Squibb, MD   Recommendations at discharge:   Follow-up podiatry on 01/05/2022 Follow-up vascular surgery in 3 to 4 weeks  Discharge Diagnoses: Principal Problem:   Severe sepsis ([place]) Active Problems:   Hyperlipemia   Essential hypertension   Atrial fibrillation (HCC)   Protein-calorie malnutrition, moderate (HCC)   AKI (acute kidney injury) ([place])   Diarrhea   Hyperkalemia   Osteomyelitis of great toe of left foot (HCC)   Atrial flutter with rapid ventricular response ([place])  Resolved Problems:   * No resolved hospital problems. *  Hospital Course:  81 year old female with medical history of atrial fibrillation, anemia, hyperlipidemia, hypertension, stroke, peripheral vascular disease came to ED with complaints of diarrhea.  Also reported left foot wound with significant pain, she was previously started on Bactrim.  MRI indicated left great toe osteomyelitis, she was seen by vascular surgery.  Started on broad-spectrum antibiotics.  Diarrhea resolved.  Podiatry took her to the OR on 9/22 for incisional debridement and amputation of left big toe.  Postop developed atrial flutter therefore cardiology was consulted.  Due to signs of fluid overload she also received Lasix. 9/18 mri left foot 1. Soft tissue wound at the medial aspect of the great toe with findings suspicious for early acute osteomyelitis of the great toe distal phalanx. 2. Mild bone marrow edema within the base of the great toe proximal phalanx, first metatarsal, and of the second toe proximal phalanx without marrow replacement or erosion. Findings are favored to reflect reactive osteitis without findings of acute osteomyelitis at this time. 3. Mild degenerative changes of the forefoot, most pronounced at first and second MTP  joints.  Assessment and Plan:  Severe sepsis secondary to left great toe osteomyelitis -S/p debridement and amputation of left great toe on 9/22 -She had stent to left SFA , left popliteal DCB, angioplasty of popliteal artery  on 12/19/2021 with amputation of left great toe during this admission -Currently on Rocephin and Flagyl -ID was consulted, recommending to continue Augmentin and doxycycline for total 10 days till 01/09/2022 -ID follow-up as outpatient, needed   Hyperkalemia -Resolved after Lokelma was given  Hypokalemia -Potassium is 3.4 today -We will replace potassium before discharge   Paroxysmal atrial flutter -Continue metoprolol 100 mg twice daily -Patient received IV digoxin on 9/23 -Echocardiogram showed EF of 55% -Eliquis resumed -Started on low-dose amiodarone per cardiology   Acute diastolic CHF with preserved EF -Echocardiogram showed EF 55% -Patient received IV Lasix yesterday -Continue to wean off oxygen -We will continue with Lasix 20 mg daily as needed which patient was taking at home   Diarrhea -Resolved   Acute kidney injury -Secondary to dehydration and hypotension -Creatinine peaked at 1.9; today creatinine is 0.74 -Resolved   Protein calorie malnutrition -Nutrition consulted   Constipation -Continue lactulose as needed   History of hypertension -Blood pressure controlled -Continue metoprolol   History of CVA -History of right innominate occlusion and moderate LICA stenosis, 50 to 60% Carotid duplex from 03/16/2021 -Continue Plavix   Peripheral vascular disease -She has stent to left SFA   Hyperlipidemia -Continue pravastatin      Consultants: Cardiology, infectious disease, podiatry Procedures performed: Amputation of left big toe Disposition: Skilled nursing facility Diet recommendation:  Discharge Diet Orders (From admission, onward)  Start     Ordered   01/02/22 0000  Diet - low sodium heart healthy        01/02/22  1403           Cardiac diet DISCHARGE MEDICATION: Allergies as of 01/02/2022       Reactions   Iodine Rash, Other (See Comments)   BETADINE Rash/burning, blisters on skin. Burns skin   Iohexol Rash, Other (See Comments)   Blisters; PT NEEDS 13-HOUR PREP   Povidone-iodine Other (See Comments)   Burns skin   Tape Itching, Rash, Other (See Comments)   "DO NOT USE ADHESIVE TAPE" Not even a Band Aid" NO PAPER TAPE Burns skin Skin is very very thin        Medication List     STOP taking these medications    amLODipine 5 MG tablet Commonly known as: NORVASC   ascorbic acid 500 MG tablet Commonly known as: VITAMIN C   HYDROcodone-acetaminophen 5-325 MG tablet Commonly known as: NORCO/VICODIN   lisinopril 5 MG tablet Commonly known as: ZESTRIL   sulfamethoxazole-trimethoprim 800-160 MG tablet Commonly known as: BACTRIM DS   traMADol 50 MG tablet Commonly known as: ULTRAM       TAKE these medications    acetaminophen 325 MG tablet Commonly known as: TYLENOL Take 2 tablets (650 mg total) by mouth every 6 (six) hours as needed for mild pain (or Fever >/= 101).   amiodarone 200 MG tablet Commonly known as: PACERONE Take 1 tablet (200 mg total) by mouth daily. Start taking on: January 03, 2022   amoxicillin-clavulanate 875-125 MG tablet Commonly known as: AUGMENTIN Take 1 tablet by mouth every 12 (twelve) hours for 6 days.   CALCIUM PO Take 1,200 mg by mouth daily.   clopidogrel 75 MG tablet Commonly known as: PLAVIX TAKE 1 TABLET EVERY MORNING   denosumab 60 MG/ML Sosy injection Commonly known as: PROLIA Inject 60 mg into the skin every 6 (six) months.   docusate sodium 100 MG capsule Commonly known as: COLACE Take 1 capsule (100 mg total) by mouth 2 (two) times daily.   doxycycline 100 MG tablet Commonly known as: VIBRA-TABS Take 1 tablet (100 mg total) by mouth every 12 (twelve) hours for 6 days.   Eliquis 2.5 MG Tabs tablet Generic  drug: apixaban TAKE 1 TABLET TWICE A DAY   furosemide 20 MG tablet Commonly known as: LASIX Take 1 tablet (20 mg total) by mouth daily. Only as needed PRN   MAGNESIUM OXIDE PO Take 500 mg by mouth every morning.   metoprolol tartrate 25 MG tablet Commonly known as: LOPRESSOR TAKE 3 TABLETS TWICE A DAY   multivitamin with minerals Tabs tablet Take 1 tablet by mouth every morning.   ondansetron 4 MG tablet Commonly known as: ZOFRAN Take 1 tablet (4 mg total) by mouth every 6 (six) hours as needed for nausea.   oxyCODONE 5 MG immediate release tablet Commonly known as: Oxy IR/ROXICODONE Take 1 tablet (5 mg total) by mouth every 6 (six) hours as needed for moderate pain.   pantoprazole 40 MG tablet Commonly known as: PROTONIX TAKE 1 TABLET DAILY   potassium chloride 10 MEQ tablet Commonly known as: KLOR-CON M Take 10 mEq by mouth daily.   pravastatin 40 MG tablet Commonly known as: PRAVACHOL Take 1 tablet (40 mg total) by mouth daily.   Spiriva Respimat 2.5 MCG/ACT Aers Generic drug: Tiotropium Bromide Monohydrate USE 2 INHALATIONS BY MOUTH  DAILY  Durable Medical Equipment  (From admission, onward)           Start     Ordered   12/31/21 1547  For home use only DME Bedside commode  Once       Comments: Patient unable to get from bed/chair to bathroom due to current limited mobility  Question:  Patient needs a bedside commode to treat with the following condition  Answer:  Decreased functional mobility and endurance   12/31/21 1551            Follow-up Information     [facility] Follow up.   Why: 3-4 weeks.The office will call the patient with an appointment Contact information: [address] [place]        Felipa Furnace, DPM Follow up in 2 day(s).   Specialty: Podiatry Contact information: [address] [place] [address] [phone]                 Discharge Exam: Danley Danker Weights   12/24/21 2333  Weight: 50.3 kg   General-appears in no acute distress Heart-S1-S2, regular, no murmur auscultated Lungs-clear to auscultation bilaterally, no wheezing or crackles auscultated Abdomen-soft, nontender, no organomegaly Extremities-no edema in the lower extremities Neuro-alert, oriented x3, no focal deficit noted  Condition at discharge: good  The results of significant diagnostics from this hospitalization (including imaging, microbiology, ancillary and laboratory) are listed below for reference.   Imaging Studies: ECHOCARDIOGRAM COMPLETE  Result Date: 12/30/2021    ECHOCARDIOGRAM REPORT   Patient Name:   [name] Date of Exam: 12/30/2021 Medical Rec #:  [id number]    Height:       62.0 in Accession #:    [id number]   Weight:       111.0 lb Date of Birth:  June 04, 1940    BSA:          1.488 m Patient Age:    14 years     BP:           126/74 mmHg Patient Gender: F            HR:           126 bpm. Exam Location:  Inpatient Procedure: 2D Echo, Cardiac Doppler, Color Doppler and Intracardiac            Opacification Agent Indications:    I48.91* Unspeicified atrial fibrillation  History:        Patient has prior history of Echocardiogram examinations, most                 recent 10/12/2009. CAD, Abnormal ECG, COPD and Pulmonary HTN,                 Arrythmias:Atrial Fibrillation and Atrial Flutter,                 Signs/Symptoms:Syncope and Dizziness/Lightheadedness; Risk                 Factors:Hypertension and Dyslipidemia. Breast cancer. Lung mass.                 ICA stenosis. Subclavian steal 2008.  Sonographer:    Roseanna Rainbow RDCS Referring Phys: Doreatha Martin [facility]  Sonographer Comments: Technically difficult study due to poor echo windows, suboptimal parasternal window, suboptimal apical window and suboptimal subcostal window. Image acquisition challenging due to respiratory motion. Study difficult due to thin habitus. IMPRESSIONS  1. EF very  challenging with HR  126 bpm. Left ventricular ejection fraction, by estimation, is 55 to 60%. The left ventricle has normal function. The left ventricle has no regional wall motion abnormalities. There is mild left ventricular hypertrophy. Left ventricular diastolic parameters are indeterminate.  2. Right ventricular systolic function is normal. The right ventricular size is normal. There is mildly elevated pulmonary artery systolic pressure.  3. Left atrial size was mildly dilated.  4. Mild mitral valve regurgitation.  5. Tricuspid valve regurgitation is mild to moderate.  6. The aortic valve was not well visualized. Aortic valve regurgitation is not visualized.  7. The inferior vena cava is normal in size with greater than 50% respiratory variability, suggesting right atrial pressure of 3 mmHg. FINDINGS  Left Ventricle: EF very challenging with HR 126 bpm. Left ventricular ejection fraction, by estimation, is 55 to 60%. The left ventricle has normal function. The left ventricle has no regional wall motion abnormalities. Definity contrast agent was given  IV to delineate the left ventricular endocardial borders. The left ventricular internal cavity size was normal in size. There is mild left ventricular hypertrophy. Left ventricular diastolic parameters are indeterminate. Right Ventricle: The right ventricular size is normal. Right vetricular wall thickness was not well visualized. Right ventricular systolic function is normal. There is mildly elevated pulmonary artery systolic pressure. The tricuspid regurgitant velocity  is 2.78 m/s, and with an assumed right atrial pressure of 8 mmHg, the estimated right ventricular systolic pressure is 21.1 mmHg. Left Atrium: Left atrial size was mildly dilated. Right Atrium: Right atrial size was normal in size. Pericardium: There is no evidence of pericardial effusion. Mitral Valve: Mild mitral valve regurgitation. Tricuspid Valve: Tricuspid valve regurgitation is mild to  moderate. Aortic Valve: The aortic valve was not well visualized. Aortic valve regurgitation is not visualized. Aortic regurgitation PHT measures 479 msec. Pulmonic Valve: Pulmonic valve regurgitation is not visualized. Aorta: The aortic root and ascending aorta are structurally normal, with no evidence of dilitation. Venous: The inferior vena cava is normal in size with greater than 50% respiratory variability, suggesting right atrial pressure of 3 mmHg. IAS/Shunts: The interatrial septum was not well visualized.  LEFT VENTRICLE PLAX 2D LVIDd:         3.80 cm     Diastology LVIDs:         3.40 cm     LV e' medial:    5.66 cm/s LV PW:         0.80 cm     LV E/e' medial:  15.2 LV IVS:        1.10 cm     LV e' lateral:   5.66 cm/s LVOT diam:     2.00 cm     LV E/e' lateral: 15.2 LV SV:         38 LV SV Index:   26 LVOT Area:     3.14 cm  LV Volumes (MOD) LV vol d, MOD A2C: 64.1 ml LV vol d, MOD A4C: 52.7 ml LV vol s, MOD A2C: 35.7 ml LV vol s, MOD A4C: 20.7 ml LV SV MOD A2C:     28.4 ml LV SV MOD A4C:     52.7 ml LV SV MOD BP:      34.0 ml RIGHT VENTRICLE             IVC RV S prime:     10.70 cm/s  IVC diam: 1.90 cm TAPSE (M-mode): 0.7 cm LEFT ATRIUM  Index        RIGHT ATRIUM          Index LA diam:      4.40 cm 2.96 cm/m   RA Area:     7.72 cm LA Vol (A2C): 25.9 ml 17.40 ml/m  RA Volume:   14.00 ml 9.41 ml/m LA Vol (A4C): 53.2 ml 35.74 ml/m  AORTIC VALVE LVOT Vmax:   80.90 cm/s LVOT Vmean:  49.800 cm/s LVOT VTI:    0.121 m AI PHT:      479 msec  AORTA Ao Root diam: 3.30 cm Ao Asc diam:  3.30 cm MITRAL VALVE               TRICUSPID VALVE MV Area (PHT): 7.44 cm    TR Peak grad:   30.9 mmHg MV Decel Time: 102 msec    TR Vmax:        278.00 cm/s MV E velocity: 86.20 cm/s                            SHUNTS                            Systemic VTI:  0.12 m                            Systemic Diam: 2.00 cm Phineas Inches Electronically signed by Phineas Inches Signature Date/Time: 12/30/2021/4:19:20 PM    Final     DG CHEST PORT 1 VIEW  Result Date: 12/30/2021 CLINICAL DATA:  Leukocytosis. EXAM: PORTABLE CHEST 1 VIEW COMPARISON:  December 25, 2021 FINDINGS: Calcific atherosclerotic disease and tortuosity of the aorta. Cardiomediastinal silhouette is normal. Mediastinal contours appear intact. Interval development small bilateral pleural effusions. Streaky bibasilar airspace opacities may represent atelectasis or airspace consolidation. Stable right lung probable scarring. Osseous structures are without acute abnormality. Left chest wall stable postsurgical changes. IMPRESSION: 1. Interval development of small bilateral pleural effusions. 2. Streaky bibasilar airspace opacities may represent atelectasis or airspace consolidation. Electronically Signed   By: Fidela Salisbury M.D.   On: 12/30/2021 15:12   MR FOOT LEFT WO CONTRAST  Result Date: 12/25/2021 CLINICAL DATA:  Foot swelling, nondiabetic, osteomyelitis suspected EXAM: MRI OF THE LEFT FOOT WITHOUT CONTRAST TECHNIQUE: Multiplanar, multisequence MR imaging of the left forefoot was performed. No intravenous contrast was administered. COMPARISON:  X-ray 12/25/2021 FINDINGS: Technical Note: Despite efforts by the technologist and patient, motion artifact is present on today's exam and could not be eliminated. This reduces exam sensitivity and specificity. Bones/Joint/Cartilage There is bone marrow edema throughout the distal phalanx of the great toe with patchy areas of intermediate T1 marrow signal. No focal erosion. There is mild bone marrow edema along the medial aspect of the base of the great toe proximal phalanx and within the first metatarsal head. Additionally there is mode marrow edema within the head of the second toe proximal phalanx. Preserved fatty T1 marrow signal at these locations without marrow replacement or erosion. Relatively increased T2 signal within the third, fourth, and fifth toes as well as the fifth metatarsal head is felt to be  artifactual secondary to poor fat saturation. No evidence of fracture. No dislocation. Mild degenerative changes of the forefoot, most pronounced at first and second MTP joints. No sizable joint effusion. Ligaments Intact Lisfranc ligament. No evidence of collateral ligament injury. Muscles and Tendons  Chronic denervation changes of the foot musculature. Intact flexor and extensor tendons. No tenosynovitis. Soft tissues Soft tissue wound at the medial aspect of the great toe. Generalized soft tissue edema. No organized fluid collection. IMPRESSION: 1. Soft tissue wound at the medial aspect of the great toe with findings suspicious for early acute osteomyelitis of the great toe distal phalanx. 2. Mild bone marrow edema within the base of the great toe proximal phalanx, first metatarsal, and of the second toe proximal phalanx without marrow replacement or erosion. Findings are favored to reflect reactive osteitis without findings of acute osteomyelitis at this time. 3. Mild degenerative changes of the forefoot, most pronounced at first and second MTP joints. Electronically Signed   By: Davina Poke D.O.   On: 12/25/2021 10:05   DG Foot Complete Left  Result Date: 12/25/2021 CLINICAL DATA:  Possible osteomyelitis, multiple wounds on left foot. EXAM: LEFT FOOT - COMPLETE 3+ VIEW COMPARISON:  None Available. FINDINGS: There is no evidence of fracture or dislocation. Evaluation for osteomyelitis is limited due to osteopenia. There questionable lucencies along the lateral aspect of the tuft of the first and second digits. Soft tissues are unremarkable. IMPRESSION: Possible lucencies at the tufts of the distal phalanx of the first and second digits, the possibility of osteomyelitis can not be excluded. Consider MRI for further evaluation. Electronically Signed   By: Brett Fairy M.D.   On: 12/25/2021 01:01   DG Chest Port 1 View  Result Date: 12/25/2021 CLINICAL DATA:  Leukocytosis, diarrhea. EXAM: PORTABLE  CHEST 1 VIEW COMPARISON:  12/19/2018 FINDINGS: The heart size and mediastinal contours are within normal limits. There is atherosclerotic calcification of the aorta. Emphysematous changes are noted in the lungs. There is stable scarring in the mid to right lower lung. No consolidation, effusion, or pneumothorax. Surgical clips are noted in the left upper chest. No acute osseous abnormality. IMPRESSION: 1. No active disease. 2. Emphysematous changes and stable scarring in the right lung. Electronically Signed   By: Brett Fairy M.D.   On: 12/25/2021 00:45   PERIPHERAL VASCULAR CATHETERIZATION  Result Date: 12/19/2021 Patient name: [name] MRN: [id number] DOB: 1940-07-15 Sex: female 12/19/2021 Pre-operative Diagnosis: Left leg ischemia Post-operative diagnosis:  Same Surgeon:  Annamarie Major Procedure Performed:  1.  Ultrasound-guided access, right femoral artery  2.  Aortogram  3.  Left lower extremity runoff  4.  Stent, left superficial femoral artery  5.  Drug-coated balloon angioplasty, left popliteal artery  6.  Angioplasty, left peroneal artery  7.  Conscious sedation, 102 minutes Indications: This is an 81 year old female with critical limb ischemia to the left foot.  She comes in today for angiogram and possible invention Procedure:  The patient was identified in the holding area and taken to room 8.  The patient was then placed supine on the table and prepped and draped in the usual sterile fashion.  A time out was called.  Conscious sedation was administered with the use of IV fentanyl and Versed under continuous physician and nurse monitoring.  Heart rate, blood pressure, and oxygen saturation were continuously monitored.  Total sedation time was 102 minutes.  Ultrasound was used to evaluate the right common femoral artery.  It was patent .  A digital ultrasound image was acquired.  A micropuncture needle was used to access the right common femoral artery under ultrasound guidance.  An 018 wire was  advanced without resistance and a micropuncture sheath was placed.  The 018 wire was removed and  a benson wire was placed.  The micropuncture sheath was exchanged for a 5 french sheath.  An omniflush catheter was advanced over the wire to the level of L-1.  An abdominal angiogram was obtained.  Next, using the omniflush catheter and a benson wire, the aortic bifurcation was crossed and the catheter was placed into theleft external iliac artery and left runoff was obtained. Findings:  Aortogram: No significant renal artery stenosis was identified.  The infrarenal abdominal aorta is calcified but patent without significant stenosis.  Bilateral common and external iliac arteries are widely patent  Right Lower Extremity: Not evaluated  Left Lower Extremity: The left common femoral artery and profundofemoral artery are patent throughout their course without significant stenosis.  The superficial femoral artery is occluded at its origin.  There is reconstitution just beyond the adductor canal of a moderately diseased popliteal artery.  There is small caliber below-knee popliteal artery with two-vessel runoff via the peroneal and anterior tibial artery with stenosis at the origin of the tibioperoneal trunk. Intervention: After the above images were acquired the decision was made to proceed with intervention.  A 6 French 45 cm sheath was advanced into the left common femoral artery and the patient was fully heparinized.  I began by using an 035 Glidewire with a quick cross catheter.  Subintimal recanalization was performed with reentry in the above-knee popliteal artery which was confirmed with a contrast injection.  I then was able to advance a versa core down into the peroneal artery.  The subintimal tract was then dilated with a 4 mm balloon and then I placed overlapping 6 mm Elluvia stents up to the origin of the superficial femoral artery.  These were postdilated with a 5 mm balloon.  Completion imaging showed widely  patent superficial femoral artery at this point with widely patent stents. Next, I switched out to a V-18 wire.  I selected a 2.5 x 200 balloon and perform balloon angioplasty of the peroneal, below-knee popliteal, tibioperoneal trunk, and above-knee popliteal artery at nominal pressure for 2 minutes.  Completion imaging showed significant improvement however there was persistent disease in the above-knee popliteal artery so I treated this with a 4 x 80 drug-coated Ranger balloon.  I also dilated the tibioperoneal trunk and peroneal artery with a 2.5 mm balloon.  Completion imaging was then performed which showed inline flow down to the ankle with a intact pedal arch.  Is very satisfied with these results.  I could not close the groin because of the calcific disease in the right groin.  Patient be taken the holding area for sheath pull Impression:  #1  Occlusion of left superficial femoral artery which was able to be recanalized and stented using 6 mm Elluvia stents.  #2  Moderate approximately 50% stenosis within the above-knee popliteal artery treated using a 4 mm drug-coated balloon with less than 20% stenosis  #3  Disease below the knee popliteal artery which was very small in caliber and relatively uniform.  This was dilated with a 2.5 mm balloon with no residual stenosis.  Also performed balloon angioplasty of the proximal tibioperoneal trunk and peroneal artery with a 2.5 mm balloon  #4  The patient now has inline flow to the ankle via the anterior tibial and peroneal artery.  She will return to the office in 1 week for wound check to determine the level of amputation V. Annamarie Major, M.D., [facility] [place] Office: [phone] Pager:  [phone]   VAS  [place] ABI WITH/WO TBI  Result Date: 12/15/2021  LOWER EXTREMITY DOPPLER STUDY Patient Name:  [name]  Date of Exam:   12/15/2021 Medical Rec #: [id number]     Accession #:    [id number] Date of Birth: 16-May-1940     Patient  Gender: F Patient Age:   67 years Exam [facility] Procedure:      VAS [place] ABI WITH/WO TBI Referring Phys: Aldona Bar RHYNE --------------------------------------------------------------------------------  Indications: Ulceration, and peripheral artery disease. Coldness in left foot High Risk Factors: Hypertension, hyperlipidemia, prior CVA.  Limitations: Today's exam was limited due to patient unable to lie flat,              involuntary patient movement and an open wound. Comparison Study: 12/24/2014: Rt ABI 0.86; Lt ABI 1.14. Performing Technologist: Ivan Croft  Examination Guidelines: A complete evaluation includes at minimum, Doppler waveform signals and systolic blood pressure reading at the level of bilateral brachial, anterior tibial, and posterior tibial arteries, when vessel segments are accessible. Bilateral testing is considered an integral part of a complete examination. Photoelectric Plethysmograph (PPG) waveforms and toe systolic pressure readings are included as required and additional duplex testing as needed. Limited examinations for reoccurring indications may be performed as noted.  ABI Findings: +---------+------------------+-----+--------+----------------------------------+ Right    Rt Pressure (mmHg)IndexWaveformComment                            +---------+------------------+-----+--------+----------------------------------+ Brachial                                per patient do not take brachial                                           pressure on right side             +---------+------------------+-----+--------+----------------------------------+ PTA      116               0.92 biphasic                                   +---------+------------------+-----+--------+----------------------------------+ DP       132               1.05 biphasic                                    +---------+------------------+-----+--------+----------------------------------+ Great Toe95                0.75                                            +---------+------------------+-----+--------+----------------------------------+ +---------+------------------+-----+-------------------+-----------------------+ Left     Lt Pressure (mmHg)IndexWaveform           Comment                 +---------+------------------+-----+-------------------+-----------------------+ Brachial 126                                                               +---------+------------------+-----+-------------------+-----------------------+  PTA                                                unable to insonate      +---------+------------------+-----+-------------------+-----------------------+ DP       42                0.33 dampened monophasicdifficult to determine                                                     arterial vs venous flow +---------+------------------+-----+-------------------+-----------------------+ Great Toe                       Absent             unable to obtain                                                           pressure due to open                                                       wound/bandaging         +---------+------------------+-----+-------------------+-----------------------+ +-------+-----------+-----------+------------+------------+ ABI/TBIToday's ABIToday's TBIPrevious ABIPrevious TBI +-------+-----------+-----------+------------+------------+ Right  1.05       0.75       0.86                     +-------+-----------+-----------+------------+------------+ Left   0.33       0          1.14                     +-------+-----------+-----------+------------+------------+   Summary: Right: Resting right ankle-brachial index is within normal range. The right toe-brachial index is normal. Left: Resting left ankle-brachial  index indicates severe left lower extremity arterial disease. *See table(s) above for measurements and observations.  Electronically signed by Orlie Pollen on 12/15/2021 at 4:22:57 PM.    Final     Microbiology: Results for orders placed or performed during the hospital encounter of 12/24/21  Culture, blood (routine x 2)     Status: None   Collection Time: 12/25/21  1:15 AM   Specimen: BLOOD LEFT HAND  Result Value Ref Range Status   Specimen Description BLOOD LEFT HAND BOTTLES DRAWN AEROBIC ONLY  Final   Special Requests   Final    Blood Culture results may not be optimal due to an inadequate volume of blood received in culture bottles   Culture   Final    NO GROWTH 5 DAYS Performed at [facility], [address] [place], [place] [address]    Report Status 12/30/2021 FINAL  Final  Culture, blood (routine x 2)     Status: None   Collection Time: 12/25/21  1:30 AM   Specimen: Left Antecubital; Blood  Result Value  Ref Range Status   Specimen Description   Final    LEFT ANTECUBITAL BOTTLES DRAWN AEROBIC AND ANAEROBIC   Special Requests Blood Culture adequate volume  Final   Culture  Setup Time ANAEROBIC BOTTLE ONLY NO ORGANISMS SEEN   Final   Culture   Final    NO GROWTH 5 DAYS Performed at [facility], [address] [place], [place] [address]    Report Status 12/30/2021 FINAL  Final  Surgical pcr screen     Status: Abnormal   Collection Time: 12/29/21 12:14 PM   Specimen: Nasal Mucosa; Nasal Swab  Result Value Ref Range Status   MRSA, PCR POSITIVE (A) NEGATIVE Final    Comment: RESULT CALLED TO, READ BACK BY AND VERIFIED WITH: 12/29/21 1430 RN KIANG    Staphylococcus aureus POSITIVE (A) NEGATIVE Final    Comment: (NOTE) The Xpert SA Assay (FDA approved for NASAL specimens in patients 46 years of age and older), is one component of a comprehensive surveillance program. It is not intended to diagnose infection nor to guide or monitor treatment. Performed at [facility], [place] [address], [place], [place] [address]     Labs: CBC: Recent Labs  Lab 12/27/21 0130 12/28/21 0131 12/29/21 0054 12/30/21 0618 12/31/21 0158 01/01/22 0114 01/02/22 0148  WBC 18.7* 14.2* 13.3* 16.7* 17.9* 16.2* 15.0*  NEUTROABS 14.5* 10.2* 9.9* 14.6*  --   --   --   HGB 7.8* 8.6* 7.7* 9.1* 7.8* 8.0* 8.3*  HCT 24.0* 26.9* 23.2* 27.7* 24.4* 24.7* 24.9*  MCV 96.8 98.9 96.3 96.5 98.8 98.8 97.3  PLT 327 363 335 399 358 355 701   Basic Metabolic Panel: Recent Labs  Lab 12/29/21 0054 12/30/21 0618 12/31/21 0158 01/01/22 0114 01/02/22 0148  NA 139 138 141 140 138  K 3.1* 4.6 3.3* 3.9 3.4*  CL 118* 113* 114* 113* 110  CO2 16* 16* 18* 19* 22  GLUCOSE 162* 149* 91 93 100*  BUN 16 18 24* 26* 25*  CREATININE 0.71 0.83 0.69 0.68 0.74  CALCIUM 6.6* 7.7* 7.5* 8.0* 8.1*  MG  --   --  1.5* 1.7 1.7   Liver Function Tests: No results for input(s): "AST", "ALT", "ALKPHOS", "BILITOT", "PROT", "ALBUMIN" in the last 168 hours. CBG: No results for input(s): "GLUCAP" in the last 168 hours.  Discharge time spent: greater than 30 minutes.  Signed: Oswald Hillock, MD [facility] 01/02/2022

## 2022-01-02 NOTE — Telephone Encounter (Signed)
Pt's husband called stating that the pt was still in the hospital but transferring to rehab. He was unsure about next steps or f/u appt.  Reviewed pt's chart, returned call for clarification, two identifiers used. Informed him that the rehab center would be contacted to make transportation arrangements if she were still there at her appt date. Msg sent to scheduler to arrange. Pt will be at [facility] at [phone]. If she is no longer in rehab, pt's spouse will bring her. Confirmed understanding.

## 2022-01-03 NOTE — Telephone Encounter (Signed)
Appt has been scheduled.

## 2022-01-05 NOTE — Telephone Encounter (Signed)
Wound care [email or web address]  is aware of physician's instructions and upcoming appointment 10/04.

## 2022-01-09 ENCOUNTER — Other Ambulatory Visit: Payer: Self-pay | Admitting: *Deleted

## 2022-01-09 DIAGNOSIS — I70222 Atherosclerosis of native arteries of extremities with rest pain, left leg: Secondary | ICD-10-CM

## 2022-01-10 ENCOUNTER — Telehealth: Payer: Self-pay | Admitting: Cardiovascular Disease

## 2022-01-10 ENCOUNTER — Ambulatory Visit (INDEPENDENT_AMBULATORY_CARE_PROVIDER_SITE_OTHER): Payer: Medicare HMO | Admitting: Podiatry

## 2022-01-10 DIAGNOSIS — Z9889 Other specified postprocedural states: Secondary | ICD-10-CM

## 2022-01-10 DIAGNOSIS — L97522 Non-pressure chronic ulcer of other part of left foot with fat layer exposed: Secondary | ICD-10-CM

## 2022-01-10 DIAGNOSIS — Z89412 Acquired absence of left great toe: Secondary | ICD-10-CM

## 2022-01-10 NOTE — Telephone Encounter (Signed)
Pt's husband would like a call back to discuss getting patient in to see [name] sooner than what is available currently on 12/19. He also did not want to be scheduled with a N.P. or P.A. Requesting call back.

## 2022-01-10 NOTE — Progress Notes (Signed)
Subjective:  Patient ID: [name], female    DOB: Nov 10, 1940,  MRN: [id number]  Chief Complaint  Patient presents with   Routine Post Op    DOS: 12/29/2021 Procedure: Left partial first ray amputation with excisional debridement of wound with application of skin graft  81 y.o. female returns for post-op check.  Patient states that she is doing well.  No acute complaints.  No nausea fever chills vomiting  Review of Systems: Negative except as noted in the HPI. Denies N/V/F/Ch.  Past Medical History:  Diagnosis Date   Anemia 05/2014.   Atrial fibrillation ([place])    x 3 yrs   Breast cancer ([place])    S/P left mastectomy and chemotherapy 1989 remained in remission   Carotid artery occlusion    Carotid bruit    LICA 02-58% (duplex 5/27)   Degenerative arthritis of spine 2015   Chronic back pain   Hyperlipidemia    Hypertension    Meningioma ([place])    Osteoporosis    Pneumonia May 19, 2014   Stroke [facility])    CVA 2008   Subclavian steal syndrome    Ulcers of both lower extremities ([place]) 2015    Current Outpatient Medications:    acetaminophen (TYLENOL) 325 MG tablet, Take 2 tablets (650 mg total) by mouth every 6 (six) hours as needed for mild pain (or Fever >/= 101)., Disp: , Rfl:    amiodarone (PACERONE) 200 MG tablet, Take 1 tablet (200 mg total) by mouth daily., Disp: , Rfl:    CALCIUM PO, Take 1,200 mg by mouth daily., Disp: , Rfl:    clopidogrel (PLAVIX) 75 MG tablet, TAKE 1 TABLET EVERY MORNING, Disp: 90 tablet, Rfl: 3   denosumab (PROLIA) 60 MG/ML SOSY injection, Inject 60 mg into the skin every 6 (six) months., Disp: , Rfl:    docusate sodium (COLACE) 100 MG capsule, Take 1 capsule (100 mg total) by mouth 2 (two) times daily., Disp: 10 capsule, Rfl: 0   ELIQUIS 2.5 MG TABS tablet, TAKE 1 TABLET TWICE A DAY, Disp: 180 tablet, Rfl: 1   furosemide (LASIX) 20 MG tablet, Take 1 tablet (20 mg total) by mouth daily. Only as needed PRN, Disp: 30 tablet, Rfl:    MAGNESIUM  OXIDE PO, Take 500 mg by mouth every morning., Disp: , Rfl:    metoprolol tartrate (LOPRESSOR) 25 MG tablet, TAKE 3 TABLETS TWICE A DAY, Disp: 540 tablet, Rfl: 3   Multiple Vitamin (MULTIVITAMIN WITH MINERALS) TABS tablet, Take 1 tablet by mouth every morning., Disp: , Rfl:    ondansetron (ZOFRAN) 4 MG tablet, Take 1 tablet (4 mg total) by mouth every 6 (six) hours as needed for nausea., Disp: 20 tablet, Rfl: 0   oxyCODONE (OXY IR/ROXICODONE) 5 MG immediate release tablet, Take 1 tablet (5 mg total) by mouth every 6 (six) hours as needed for moderate pain., Disp: 10 tablet, Rfl: 0   pantoprazole (PROTONIX) 40 MG tablet, TAKE 1 TABLET DAILY, Disp: 90 tablet, Rfl: 3   potassium chloride (KLOR-CON M) 10 MEQ tablet, Take 10 mEq by mouth daily., Disp: , Rfl:    pravastatin (PRAVACHOL) 40 MG tablet, Take 1 tablet (40 mg total) by mouth daily., Disp: 90 tablet, Rfl: 3   Tiotropium Bromide Monohydrate (SPIRIVA RESPIMAT) 2.5 MCG/ACT AERS, USE 2 INHALATIONS BY MOUTH  DAILY, Disp: 12 g, Rfl: 3  Social History   Tobacco Use  Smoking Status Former   Packs/day: 1.00   Years: 56.00   Total pack  years: 56.00   Types: Cigarettes   Quit date: 08/14/2006   Years since quitting: 15.4   Passive exposure: Never  Smokeless Tobacco Never  Tobacco Comments   quit in 2008    Allergies  Allergen Reactions   Iodine Rash and Other (See Comments)    BETADINE Rash/burning, blisters on skin. Burns skin   Iohexol Rash and Other (See Comments)    Blisters; PT NEEDS 13-HOUR PREP    Povidone-Iodine Other (See Comments)    Burns skin   Tape Itching, Rash and Other (See Comments)    "DO NOT USE ADHESIVE TAPE" Not even a Band Aid" NO PAPER TAPE Burns skin Skin is very very thin   Objective:  There were no vitals filed for this visit. There is no height or weight on file to calculate BMI. Constitutional Well developed. Well nourished.  Vascular Foot warm and well perfused. Capillary refill normal to all  digits.   Neurologic Normal speech. Oriented to person, place, and time. Epicritic sensation to light touch grossly present bilaterally.  Dermatologic Skin healing well without signs of infection. Skin edges well coapted without signs of infection.  Orthopedic: Tenderness to palpation noted about the surgical site.   Radiographs: None Assessment:   1. 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 Status post foot surgery   3. Foot ulcer, left, with fat layer exposed ([place])    Plan:  Patient was evaluated and treated and all questions answered.  S/p foot surgery left -Progressing as expected post-operatively. -XR: None -WB Status: Partial weightbearing to the heel in surgical shoe -Sutures: Intact.  No clinical signs of Deis is noted no complication noted -Medications: None -Foot redressed. No follow-ups on file.

## 2022-01-11 NOTE — Telephone Encounter (Signed)
Called patient's husband, Jenny Reichmann, back and made patient an appointment. Patient's husband would like to see [name] at the same time. They had to miss there last appointment due to patient being in the hospital. This will be their 6 month follow-up. Will see if we can schedule them together, and get okay by [name] to add on patient.

## 2022-01-11 NOTE — Progress Notes (Addendum)
HISTORY AND PHYSICAL     CC:  follow up. Requesting Provider:  Celene Squibb, MD  HPI: This is a 81 y.o. female who is here today for follow up for PAD.  Pt has hx of carotid artery stenosis that is followed by [name].  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     The patient has a history of breast cancer, status post left mastectomy.  She is on Eliquis for atrial fibrillation.    She came to the office 12/15/2021 with her husband of 78 years.  She had wounds on the left foot and was found to have critical limb ischemia and was scheduled for angiogram.  On 12/19/2021 she underwent angiography with stent to the left SFA and drug coated balloon angioplasty of the left popliteal artery by Dr. Trula Slade.  She was kept overnight and eliquis was restarted the next day and plavix continued.  She was discharged home.  She was admitted to the hospital later in the month with leukocytosis.  She was having diarrhea and it was not felt her foot was the source of the leukocytosis.   Her leukocytosis continued to improved and podiatry was consulted and she underwent debridement of left foot and amputation of left great toe with application of biologic by Dr. Posey Pronto on 12/29/2021.   The pt returns today for follow up and here with her husband.  She states that her foot is feeling better and she is not having any pain.  They did not want to remove the dressing off of her foot today but tell me that it is healing.  She denies any pain in the right foot or any wounds.  She states she sees the podiatrist on Wednesday.   Her husband brought her lab results today and her hgb is stable and renal function looks good.   The pt is on a statin for cholesterol management.    The pt is not on an aspirin.    Other AC:  Plavix/Eliquis The pt is on BB, diuretic for hypertension.  The pt does not have diabetes. Tobacco hx:  former    Past Medical  History:  Diagnosis Date   Anemia 05/2014.   Atrial fibrillation ([place])    x 3 yrs   Breast cancer ([place])    S/P left mastectomy and chemotherapy 1989 remained in remission   Carotid artery occlusion    Carotid bruit    LICA 84-16% (duplex 6/06)   Degenerative arthritis of spine 2015   Chronic back pain   Hyperlipidemia    Hypertension    Meningioma ([place])    Osteoporosis    Pneumonia May 19, 2014   Stroke [facility])    CVA 2008   Subclavian steal syndrome    Ulcers of both lower extremities ([place]) 2015    Past Surgical History:  Procedure Laterality Date   ABDOMINAL AORTOGRAM W/LOWER EXTREMITY N/A 12/19/2021   Procedure: ABDOMINAL AORTOGRAM W/LOWER EXTREMITY;  Surgeon: Serafina Mitchell, MD;  Location: [place];  Service: Cardiovascular;  Laterality: N/A;   AMPUTATION TOE Left 12/29/2021   Procedure: Incisional DEBRIDEMENT Left Foot with Amputation Left Great Toe;  Surgeon: Felipa Furnace, DPM;  Location: [facility];  Service: Podiatry;  Laterality: Left;   CARDIAC CATHETERIZATION     CATARACT EXTRACTION Bilateral 2013   COLONOSCOPY  11/17/2010   Procedure: COLONOSCOPY;  Surgeon: [name], MD;  [facility];  Service: Endoscopy;  Laterality: N/A;  10:45 am   ESOPHAGOGASTRODUODENOSCOPY  11/17/2010   Procedure: ESOPHAGOGASTRODUODENOSCOPY (EGD);  Surgeon: [name], MD;  Location[facility];  Service: Endoscopy;  Laterality: N/A;   ESOPHAGOGASTRODUODENOSCOPY N/A 02/16/2015   Procedure: ESOPHAGOGASTRODUODENOSCOPY (EGD);  Surgeon: [name], MD;  Location: [facility];  Service: Gastroenterology;  Laterality: N/A;   EXPLORATORY LAPAROTOMY  1960s   For peritonitis of undetermined cause   EYE SURGERY     LUMBAR EPIDURAL INJECTION  06-2012--06-2013   pt. states she has had 5 epidurals in 06-2012----06-2013   MASTECTOMY Left 1990   PERIPHERAL VASCULAR INTERVENTION Left 12/19/2021   Procedure: PERIPHERAL VASCULAR INTERVENTION;  Surgeon: Serafina Mitchell, MD;  Location: [facility];  Service: Cardiovascular;  Laterality: Left;  SFA and PTA of POP   YAG LASER APPLICATION Right 1/47/8295   Procedure: YAG LASER APPLICATION;  Surgeon: [name], MD;  Location: [facility];  Service: Ophthalmology;  Laterality: Right;  right   YAG LASER APPLICATION Left 09/27/3084   Procedure: YAG LASER APPLICATION;  Surgeon: [name], MD;  Location: [facility];  Service: Ophthalmology;  Laterality: Left;    Allergies  Allergen Reactions   Iodine Rash and Other (See Comments)    BETADINE Rash/burning, blisters on skin. Burns skin   Iohexol Rash and Other (See Comments)    Blisters; PT NEEDS 13-HOUR PREP    Povidone-Iodine Other (See Comments)    Burns skin   Tape Itching, Rash and Other (See Comments)    "DO NOT USE ADHESIVE TAPE" Not even a Band Aid" NO PAPER TAPE Burns skin Skin is very very thin    Current Outpatient Medications  Medication Sig Dispense Refill   acetaminophen (TYLENOL) 325 MG tablet Take 2 tablets (650 mg total) by mouth every 6 (six) hours as needed for mild pain (or Fever >/= 101).     amiodarone (PACERONE) 200 MG tablet Take 1 tablet (200 mg total) by mouth daily.     CALCIUM PO Take 1,200 mg by mouth daily.     clopidogrel (PLAVIX) 75 MG tablet TAKE 1 TABLET EVERY MORNING 90 tablet 3   denosumab (PROLIA) 60 MG/ML SOSY injection Inject 60 mg into the skin every 6 (six) months.     docusate sodium (COLACE) 100 MG capsule Take 1 capsule (100 mg total) by mouth 2 (two) times daily. 10 capsule 0   ELIQUIS 2.5 MG TABS tablet TAKE 1 TABLET TWICE A DAY 180 tablet 1   furosemide (LASIX) 20 MG tablet Take 1 tablet (20 mg total) by mouth daily. Only as needed PRN 30 tablet    MAGNESIUM OXIDE PO Take 500 mg by mouth every morning.     metoprolol tartrate (LOPRESSOR) 25 MG tablet TAKE 3 TABLETS TWICE A DAY 540 tablet 3   Multiple Vitamin (MULTIVITAMIN WITH MINERALS) TABS tablet Take 1 tablet by mouth every morning.      ondansetron (ZOFRAN) 4 MG tablet Take 1 tablet (4 mg total) by mouth every 6 (six) hours as needed for nausea. 20 tablet 0   oxyCODONE (OXY IR/ROXICODONE) 5 MG immediate release tablet Take 1 tablet (5 mg total) by mouth every 6 (six) hours as needed for moderate pain. 10 tablet 0   pantoprazole (PROTONIX) 40 MG tablet TAKE 1 TABLET DAILY 90 tablet 3   potassium chloride (KLOR-CON M) 10 MEQ tablet Take 10 mEq by mouth daily.     pravastatin (PRAVACHOL) 40 MG tablet Take 1  tablet (40 mg total) by mouth daily. 90 tablet 3   Tiotropium Bromide Monohydrate (SPIRIVA RESPIMAT) 2.5 MCG/ACT AERS USE 2 INHALATIONS BY MOUTH  DAILY 12 g 3   No current facility-administered medications for this visit.    Family History  Problem Relation Age of Onset   Alzheimer's disease Mother    Dementia Mother    Hypertension Mother    Hyperlipidemia Mother    Parkinsonism Father     Social History   Socioeconomic History   Marital status: Married    Spouse name: Not on file   Number of children: 2   Years of education: Not on file   Highest education level: Not on file  Occupational History   Occupation: Retired    Fish farm manager: RETIRED  Tobacco Use   Smoking status: Former    Packs/day: 1.00    Years: 56.00    Total pack years: 56.00    Types: Cigarettes    Quit date: 08/14/2006    Years since quitting: 15.4    Passive exposure: Never   Smokeless tobacco: Never   Tobacco comments:    quit in 2008  Vaping Use   Vaping Use: Never used  Substance and Sexual Activity   Alcohol use: No    Alcohol/week: 0.0 standard drinks of alcohol   Drug use: No   Sexual activity: Never  Other Topics Concern   Not on file  Social History Narrative   Not on file   Social Determinants of Health   Financial Resource Strain: Not on file  Food Insecurity: No Food Insecurity (12/26/2021)   Hunger Vital Sign    Worried About Running Out of Food in the Last Year: Never true    [place] in the Last Year:  Never true  Transportation Needs: No Transportation Needs (12/26/2021)   PRAPARE - Hydrologist (Medical): No    Lack of Transportation (Non-Medical): No  Physical Activity: Not on file  Stress: Not on file  Social Connections: Not on file  Intimate Partner Violence: Not At Risk (12/26/2021)   Humiliation, Afraid, Rape, and Kick questionnaire    Fear of Current or Ex-Partner: No    Emotionally Abused: No    Physically Abused: No    Sexually Abused: No     REVIEW OF SYSTEMS:   [X]  denotes positive finding, [ ]  denotes negative finding Cardiac  Comments:  Chest pain or chest pressure:    Shortness of breath upon exertion:    Short of breath when lying flat:    Irregular heart rhythm:        Vascular    Pain in calf, thigh, or hip brought on by ambulation:    Pain in feet at night that wakes you up from your sleep:     Blood clot in your veins:    Leg swelling:         Pulmonary    Oxygen at home:    Productive cough:     Wheezing:         Neurologic    Sudden weakness in arms or legs:     Sudden numbness in arms or legs:     Sudden onset of difficulty speaking or slurred speech:    Temporary loss of vision in one eye:     Problems with dizziness:         Gastrointestinal    Blood in stool:     Vomited blood:  Genitourinary    Burning when urinating:     Blood in urine:        Psychiatric    Major depression:         Hematologic    Bleeding problems:    Problems with blood clotting too easily:        Skin    Rashes or ulcers:        Constitutional    Fever or chills:      PHYSICAL EXAMINATION:  Today's Vitals   01/15/22 1507  BP: 128/74  Pulse: 81  Resp: 14  Temp: (!) 97.1 F (36.2 C)  TempSrc: Temporal  SpO2: 98%  Weight: 104 lb (47.2 kg)  Height: 5\' 2"  (1.575 m)   Body mass index is 19.02 kg/m.   General:  WDWN in NAD; vital signs documented above Gait: Not observed HENT: WNL,  normocephalic Pulmonary: normal non-labored breathing , without wheezing Cardiac: irregular HR Skin: without rashes Vascular Exam/Pulses: Pt with brisk DP doppler signals bilaterally Extremities: left foot is warm with bandage in place.  Dorsum of the foot appears improved from previous pictures.  Musculoskeletal: no muscle wasting or atrophy  Neurologic: A&O X 3 Psychiatric:  The pt has Normal affect.   Non-Invasive Vascular Imaging:   ABI's/TBI's on 01/09/2022: Right:  1.08/0.86 - Great toe pressure: 98 Left:  0.99/amp - Great toe pressure: amp  Arterial duplex on 01/09/2022: SFA stent patent without stenosis Left popliteal artery with ~50% stenosis  Previous ABI's/TBI's on 12/15/2021: Right:  1.05/0.75 - Great toe pressure: 95 Left:  0.33/absent - Great toe pressure:  absent    ASSESSMENT/PLAN:: 81 y.o. female here for follow up for PAD with hx of left foot wounds and s/p angiography with stent to the left SFA and drug coated balloon angioplasty of the left popliteal artery by Dr. Trula Slade on 12/19/2021.  She subsequently underwent left great toe ampuation by Dr. Posey Pronto on 12/29/2021.   -pt with brisk biphasic doppler signal left DP.  Her ABI has improved from 33% to 99% on the left and the right is within normal range with normal toe pressure.  -toe amputation is subjectively healing.  They did not want to remove bandage today.  They do have f/u appt with podiatry on Wednesday.   -continue plavix/eliquis/statin -pt will f/u in 6 months with ABI and LLE arterial duplex -they will call sooner if there are any issues before then.     Leontine Locket, [facility] [phone]  Clinic MD:   Trula Slade

## 2022-01-15 ENCOUNTER — Ambulatory Visit (INDEPENDENT_AMBULATORY_CARE_PROVIDER_SITE_OTHER)
Admission: RE | Admit: 2022-01-15 | Discharge: 2022-01-15 | Disposition: A | Discharge status: Home / Self Care | Payer: Medicare HMO | Source: Ambulatory Visit | Attending: Surgery | Admitting: Surgery

## 2022-01-15 ENCOUNTER — Ambulatory Visit (INDEPENDENT_AMBULATORY_CARE_PROVIDER_SITE_OTHER): Payer: Medicare HMO | Admitting: Physician Assistant

## 2022-01-15 ENCOUNTER — Ambulatory Visit (HOSPITAL_COMMUNITY)
Admission: RE | Admit: 2022-01-15 | Discharge: 2022-01-15 | Disposition: A | Discharge status: Home / Self Care | Payer: Medicare HMO | Source: Ambulatory Visit | Attending: Surgery | Admitting: Surgery

## 2022-01-15 ENCOUNTER — Telehealth: Payer: Self-pay | Admitting: Cardiovascular Disease

## 2022-01-15 ENCOUNTER — Other Ambulatory Visit: Payer: Self-pay

## 2022-01-15 VITALS — BP 128/74 | HR 81 | Temp 97.1°F | Resp 14 | Ht 62.0 in | Wt 104.0 lb

## 2022-01-15 DIAGNOSIS — I70222 Atherosclerosis of native arteries of extremities with rest pain, left leg: Secondary | ICD-10-CM

## 2022-01-15 DIAGNOSIS — I70223 Atherosclerosis of native arteries of extremities with rest pain, bilateral legs: Secondary | ICD-10-CM

## 2022-01-15 NOTE — Telephone Encounter (Signed)
Pt c/o medication issue:  1. Name of Medication:   amiodarone (PACERONE) 200 MG tablet    pravastatin (PRAVACHOL) 40 MG tablet    2. How are you currently taking this medication (dosage and times per day)?   3. Are you having a reaction (difficulty breathing--STAT)? No  4. What is your medication issue? Pt's husband states that he would like to know if provider agrees with medication changes. He states that the Pravastatin has now been changed to Rosuvastatin 5 MG. Please advise

## 2022-01-15 NOTE — Telephone Encounter (Signed)
Tried to call patient's husband. He was not home at the time and patient prefers that we talk to her husband. She will have her husband call back tomorrow.

## 2022-01-16 MED ORDER — AMIODARONE HCL 200 MG PO TABS: 200.0000 mg | ORAL_TABLET | Freq: Every day | ORAL | 3 refills | Status: DC

## 2022-01-16 NOTE — Addendum Note (Signed)
Addended by: Aris Georgia, Jerine Surles L on: 01/16/2022 01:57 PM   Modules accepted: Orders

## 2022-01-16 NOTE — Telephone Encounter (Signed)
Called patient's husband to let him know [name] stated patient should be on amiodarone and not on amlodipine.

## 2022-01-16 NOTE — Telephone Encounter (Signed)
Called patient's husband back. Patient's husband stated that patient needs refill on amiodarone if that is what [name] wants patient to be on. Informed him that [name] prescribed amiodarone according to his hospital note. Patient stated that her pravastatin was changed as well, but there is nothing stating changs per hospital notes. Patient's discharge stated to stop amlodipine and lisinopril. Patient has started taking the amlodipine again, patient wants [name] advisement on this. Informed patient's husband that these 2 medications were discontinued at discharge. Patient's husband would like clarification from [name] if patient should be taking or not.

## 2022-01-16 NOTE — Addendum Note (Signed)
Addended by: Aris Georgia, Yatzari Jonsson L on: 01/16/2022 04:14 PM   Modules accepted: Orders

## 2022-01-17 ENCOUNTER — Ambulatory Visit (INDEPENDENT_AMBULATORY_CARE_PROVIDER_SITE_OTHER): Payer: Medicare HMO | Admitting: Podiatry

## 2022-01-17 ENCOUNTER — Telehealth: Payer: Self-pay | Admitting: Cardiovascular Disease

## 2022-01-17 DIAGNOSIS — Z89412 Acquired absence of left great toe: Secondary | ICD-10-CM

## 2022-01-17 DIAGNOSIS — L97522 Non-pressure chronic ulcer of other part of left foot with fat layer exposed: Secondary | ICD-10-CM | POA: Diagnosis not present

## 2022-01-17 DIAGNOSIS — Z9889 Other specified postprocedural states: Secondary | ICD-10-CM

## 2022-01-17 MED ORDER — SANTYL 250 UNIT/GM EX OINT: 1.0000 | TOPICAL_OINTMENT | Freq: Every day | CUTANEOUS | 0 refills | Status: DC

## 2022-01-17 NOTE — Telephone Encounter (Signed)
Saintclair Halsted, home health nurse, back about her message. Informed her that it is normal for patient with A. FIB and CAD to be on Plavix and Eliquis. She also stated that patient's wound on her foot was bleeding through dressing that was placed after her staples were removed this morning. Nurse stated that there was a pool of blood in the patient's shoe, so she changed bandage and patient's wound was not bleeding through bandage yet, but she wanted to let someone know. She stated she would call Dr. Serita Grit office as well. Informed her that message would be sent to [name] to see if patient needs to hold plavix and eliquis for a certain time frame to help prevent more bleeding from wound.

## 2022-01-17 NOTE — Telephone Encounter (Signed)
Pt c/o medication issue:  1. Name of Medication: ELIQUIS 2.5 MG TABS tablet clopidogrel (PLAVIX) 75 MG tablet  2. How are you currently taking this medication (dosage and times per day)? As prescribed   3. Are you having a reaction (difficulty breathing--STAT)? No   4. What is your medication issue? Elmyra Ricks a home health nurse with [facility] is calling to report eliquis and plavix show a level two reaction in her system. Patient is not having a reaction from taking both medication, but she does have bleeding on wounds of left foot from staples and a graft being removed today.

## 2022-01-17 NOTE — Telephone Encounter (Signed)
[name], [facility], she was informed of both [name] and Dr. Serita Grit advisement. Called patient's husband, DPR, to let him know of recommendations, and to hold the plavix for one week. He will also keep an eye on bandage and look for any bleeding and if it soaks through dressing he will take patient to ED.

## 2022-01-17 NOTE — Progress Notes (Signed)
Subjective:  Patient ID: [name], female    DOB: 10-12-1940,  MRN: [id number]  Chief Complaint  Patient presents with   Routine Post Op    pov # 2 left great toe amputation    DOS: 12/29/2021 Procedure: Left partial first ray amputation with excisional debridement of wound with application of skin graft  81 y.o. female returns for post-op check.  Patient states she is doing okay.  Minimal pain no acute complaints.  Review of Systems: Negative except as noted in the HPI. Denies N/V/F/Ch.  Past Medical History:  Diagnosis Date   Anemia 05/2014.   Atrial fibrillation ([place])    x 3 yrs   Breast cancer ([place])    S/P left mastectomy and chemotherapy 1989 remained in remission   Carotid artery occlusion    Carotid bruit    LICA 79-02% (duplex 4/09)   Degenerative arthritis of spine 2015   Chronic back pain   Hyperlipidemia    Hypertension    Meningioma ([place])    Osteoporosis    Pneumonia May 19, 2014   Stroke [facility])    CVA 2008   Subclavian steal syndrome    Ulcers of both lower extremities ([place]) 2015    Current Outpatient Medications:    collagenase (SANTYL) 250 UNIT/GM ointment, Apply 1 Application topically daily., Disp: 15 g, Rfl: 0   acetaminophen (TYLENOL) 325 MG tablet, Take 2 tablets (650 mg total) by mouth every 6 (six) hours as needed for mild pain (or Fever >/= 101)., Disp: , Rfl:    amiodarone (PACERONE) 200 MG tablet, Take 1 tablet (200 mg total) by mouth daily., Disp: 90 tablet, Rfl: 3   ascorbic acid (VITAMIN C) 500 MG tablet, Take 500 mg by mouth daily., Disp: , Rfl:    CALCIUM PO, Take 1,200 mg by mouth daily., Disp: , Rfl:    Cholecalciferol (VITAMIN D-3 PO), Take by mouth., Disp: , Rfl:    clopidogrel (PLAVIX) 75 MG tablet, TAKE 1 TABLET EVERY MORNING, Disp: 90 tablet, Rfl: 3   denosumab (PROLIA) 60 MG/ML SOSY injection, Inject 60 mg into the skin every 6 (six) months., Disp: , Rfl:    docusate sodium (COLACE) 100 MG capsule, Take 1 capsule (100 mg  total) by mouth 2 (two) times daily., Disp: 10 capsule, Rfl: 0   ELIQUIS 2.5 MG TABS tablet, TAKE 1 TABLET TWICE A DAY, Disp: 180 tablet, Rfl: 1   furosemide (LASIX) 20 MG tablet, Take 1 tablet (20 mg total) by mouth daily. Only as needed PRN, Disp: 30 tablet, Rfl:    MAGNESIUM OXIDE PO, Take 500 mg by mouth every morning., Disp: , Rfl:    metoprolol tartrate (LOPRESSOR) 25 MG tablet, TAKE 3 TABLETS TWICE A DAY, Disp: 540 tablet, Rfl: 3   Multiple Vitamin (MULTIVITAMIN WITH MINERALS) TABS tablet, Take 1 tablet by mouth every morning., Disp: , Rfl:    ondansetron (ZOFRAN) 4 MG tablet, Take 1 tablet (4 mg total) by mouth every 6 (six) hours as needed for nausea., Disp: 20 tablet, Rfl: 0   oxyCODONE (OXY IR/ROXICODONE) 5 MG immediate release tablet, Take 1 tablet (5 mg total) by mouth every 6 (six) hours as needed for moderate pain., Disp: 10 tablet, Rfl: 0   pantoprazole (PROTONIX) 40 MG tablet, TAKE 1 TABLET DAILY, Disp: 90 tablet, Rfl: 3   potassium chloride (KLOR-CON M) 10 MEQ tablet, Take 10 mEq by mouth daily., Disp: , Rfl:    pravastatin (PRAVACHOL) 40 MG tablet, Take 1 tablet (40  mg total) by mouth daily. (Patient not taking: Reported on 01/15/2022), Disp: 90 tablet, Rfl: 3   Tiotropium Bromide Monohydrate (SPIRIVA RESPIMAT) 2.5 MCG/ACT AERS, USE 2 INHALATIONS BY MOUTH  DAILY, Disp: 12 g, Rfl: 3  Social History   Tobacco Use  Smoking Status Former   Packs/day: 1.00   Years: 56.00   Total pack years: 56.00   Types: Cigarettes   Quit date: 08/14/2006   Years since quitting: 15.4   Passive exposure: Never  Smokeless Tobacco Never  Tobacco Comments   quit in 2008    Allergies  Allergen Reactions   Iodine Rash and Other (See Comments)    BETADINE Rash/burning, blisters on skin. Burns skin   Iohexol Rash and Other (See Comments)    Blisters; PT NEEDS 13-HOUR PREP    Povidone-Iodine Other (See Comments)    Burns skin   Tape Itching, Rash and Other (See Comments)    "DO NOT USE  ADHESIVE TAPE" Not even a Band Aid" NO PAPER TAPE Burns skin Skin is very very thin   Objective:  There were no vitals filed for this visit. There is no height or weight on file to calculate BMI. Constitutional Well developed. Well nourished.  Vascular Foot warm and well perfused. Capillary refill normal to all digits.   Neurologic Normal speech. Oriented to person, place, and time. Epicritic sensation to light touch grossly present bilaterally.  Dermatologic Left dorsal foot wound with fat layer exposed.  Granular/fibrogranular wound base noted.  No malodor present no purulent drainage noted.  Orthopedic: No tenderness to palpation noted about the surgical site.   Radiographs: None Assessment:   1. 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 Status post foot surgery   3. Foot ulcer, left, with fat layer exposed ([place])     Plan:  Patient was evaluated and treated and all questions answered.  S/p foot surgery left -Clinic healed.  No signs of dehiscence noted.  No complications noted.  Left dorsal foot wound with fat layer exposed -Minimal debridement was carried out.  Patient does have granular with some fibrotic patches at the wound base.  I believe patient will benefit from Santyl wet-to-dry dressing change to allow for more granulation tissue.  Ultimately patient may need to benefit from split-thickness skin graft once there is graft granulation tissue available. -If there is no improvement we will discuss negative pressure wound therapy. No follow-ups on file.    No follow-ups on file.

## 2022-01-22 ENCOUNTER — Other Ambulatory Visit: Payer: Self-pay | Admitting: Podiatry

## 2022-01-22 NOTE — Progress Notes (Signed)
Patient ID: [name], female   DOB: 06-Jun-1940, 81 y.o.   MRN: [id number]     81 y.o. 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 She has been asymptomatic and last saw him 09/20/41 LICA 15-40% duplex 11/13/74 GI bleed 02/20/15 transfused 4 units INR over 10 coumadin changed to eliquis. Had gastric and duodenal ulcers. Chronic lung disease with necrotizing pneumonia moderate COPD sees Ramaschawmy from pulmonary   Still issues with falling Using rolling wheel chair now  Feels her UE tremors are worse  Seen by Dr Tat 12/12/17 ? Decrease dose of amiodarone furtherOr change eliquis to pradaxa and try primidone Also noted left foot drop ? From lumbar radiculopathy  Told not to start Evinity for osteoporosis due to increased risk of MI/Stroke Dr [place] Crane suggested Getting Prolia now   Avapro d/c 05/31/20 for elevated K/Cr  1.23/54. Normalized on labs 10/24/20 Primary wanted to start Lisinopril for proteinuria but not a great idea given above and age   Hospitalized for sepsis 9/17-26/2023 left foot cellulitis with amputation of left great toe She Had some atrial flutter and volume overload VVS stent to Left SFA to improve inflow with popliteal angioplasty Finished Augmentin and Doxycycline 01/09/22   ABI 01/15/22 right 1.08 and left 0.99 TTE EF 55-60% mild MR/ Mild/mod TR   Foot healing well Having PT/OT 2x/week and dressing changes   ROS: Denies fever, malais, weight loss, blurry vision, decreased visual acuity, cough, sputum, SOB, hemoptysis, pleuritic pain, palpitaitons, heartburn, abdominal pain, melena, lower extremity edema, claudication, or rash.  All other systems reviewed and negative   General: BP 110/78   Pulse (!) 45   Ht 5\' 2"  (1.575 m)   Wt 99 lb 12.8 oz (45.3 kg)   SpO2 96%   BMI 18.25 kg/m  Affect appropriate Frail thin chronically ill white female  HEENT: normal Neck supple with no adenopathy JVP normal bilateral  carotid and subclavian  bruits no thyromegaly Lungs clear with no wheezing and good diaphragmatic motion Heart:  S1/S2 no murmur, no rub, gallop or click PMI normal post left mastectomy  Abdomen: benighn, BS positve, no tenderness, no AAA no bruit.  No HSM or HJR Decreased pulse and BP in right arm  No edema Neuro UE tremors left foot drop  Skin bruising over arms  Post recent left great toe amputation      Current Outpatient Medications  Medication Sig Dispense Refill   acetaminophen (TYLENOL) 325 MG tablet Take 2 tablets (650 mg total) by mouth every 6 (six) hours as needed for mild pain (or Fever >/= 101).     amiodarone (PACERONE) 200 MG tablet Take 1 tablet (200 mg total) by mouth daily. 90 tablet 3   ascorbic acid (VITAMIN C) 500 MG tablet Take 500 mg by mouth daily.     CALCIUM PO Take 1,200 mg by mouth daily.     Cholecalciferol (VITAMIN D-3 PO) Take by mouth.     clopidogrel (PLAVIX) 75 MG tablet TAKE 1 TABLET EVERY MORNING 90 tablet 3   denosumab (PROLIA) 60 MG/ML SOSY injection Inject 60 mg into the skin every 6 (six) months.     docusate sodium (COLACE) 100 MG capsule Take 1 capsule (100 mg total) by mouth 2 (two) times daily. 10 capsule 0   ELIQUIS 2.5 MG TABS tablet TAKE 1 TABLET TWICE A DAY 180 tablet 1   Ferrous Sulfate (IRON PO) Take 65 mg by mouth daily at 6 (  six) AM.     furosemide (LASIX) 20 MG tablet Take 1 tablet (20 mg total) by mouth daily. Only as needed PRN 30 tablet    MAGNESIUM OXIDE PO Take 500 mg by mouth every morning.     metoprolol tartrate (LOPRESSOR) 25 MG tablet TAKE 3 TABLETS TWICE A DAY 540 tablet 3   Multiple Vitamin (MULTIVITAMIN WITH MINERALS) TABS tablet Take 1 tablet by mouth every morning.     oxyCODONE (OXY IR/ROXICODONE) 5 MG immediate release tablet Take 1 tablet (5 mg total) by mouth every 6 (six) hours as needed for moderate pain. 10 tablet 0   pantoprazole (PROTONIX) 40 MG tablet TAKE 1 TABLET DAILY 90 tablet 3   potassium chloride  (KLOR-CON M) 10 MEQ tablet Take 10 mEq by mouth daily.     pravastatin (PRAVACHOL) 40 MG tablet Take 1 tablet (40 mg total) by mouth daily. 90 tablet 3   SANTYL 250 UNIT/GM ointment APPLY TO AFFECTED AREA DAILY 30 g 0   Tiotropium Bromide Monohydrate (SPIRIVA RESPIMAT) 2.5 MCG/ACT AERS USE 2 INHALATIONS BY MOUTH  DAILY 12 g 3   No current facility-administered medications for this visit.    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 mevacor increased last visit LDL 90 10/24/20    HTN:  stable off avapro   Pulmonary:  Quit smoking 12 years ago Gold Class 2 COPD f/u pulmonary   Edema:  No DVT by duplex norvasc decreased stable  Anemia :  History of gastric ulcers continue protonix f/u GI  Lab Results  Component Value Date   HCT 24.9 (L) 01/02/2022   CAD:  No chest pain extensive by CT last cath 2011 with no hemodynamically significant disease Continue plavix not on ASA due to GI bleed and need for eliquis for PAF  Tremor:  Discussed changing from eliquis to pradaxa and starting primidone Amiodarone needed for PAF but can make tremor worse   Vascular:  f/u [facility] recent left great toe amputation with SFA stenting and Popliteal balloon. Post Rx ABI"s in normal range    F/u with me in 6 months     Jenkins Rouge

## 2022-01-23 ENCOUNTER — Telehealth: Payer: Self-pay | Admitting: Podiatry

## 2022-01-23 ENCOUNTER — Telehealth: Payer: Self-pay | Admitting: *Deleted

## 2022-01-23 NOTE — Telephone Encounter (Signed)
Patient is asking for a refill of the santyl ointment.   Returned there call to patient to inform that the medication has been sent to [facility], verbalized understanding.

## 2022-01-23 NOTE — Telephone Encounter (Signed)
Pt 's husband called to inquire about a RX refill on Santyl, notes per Dr. Posey Pronto stated to discontinue 10/17. Transferred to nurse for further instructions.

## 2022-01-24 ENCOUNTER — Encounter: Payer: Self-pay | Admitting: Cardiovascular Disease

## 2022-01-24 ENCOUNTER — Encounter: Payer: Medicare HMO | Attending: Internal Medicine | Admitting: Cardiovascular Disease

## 2022-01-24 VITALS — BP 110/78 | HR 45 | Ht 62.0 in | Wt 99.8 lb

## 2022-01-24 DIAGNOSIS — I70222 Atherosclerosis of native arteries of extremities with rest pain, left leg: Secondary | ICD-10-CM | POA: Diagnosis not present

## 2022-01-24 DIAGNOSIS — I480 Paroxysmal atrial fibrillation: Secondary | ICD-10-CM | POA: Diagnosis not present

## 2022-01-24 DIAGNOSIS — E782 Mixed hyperlipidemia: Secondary | ICD-10-CM | POA: Insufficient documentation

## 2022-01-24 DIAGNOSIS — E785 Hyperlipidemia, unspecified: Secondary | ICD-10-CM | POA: Insufficient documentation

## 2022-01-24 DIAGNOSIS — R0989 Other specified symptoms and signs involving the circulatory and respiratory systems: Secondary | ICD-10-CM | POA: Insufficient documentation

## 2022-01-24 NOTE — Patient Instructions (Addendum)
Medication Instructions:  Your physician recommends that you continue on your current medications as directed. Please refer to the Current Medication list given to you today.  *If you need a refill on your cardiac medications before your next appointment, please call your pharmacy*  Lab Work: If you have labs (blood work) drawn today and your tests are completely normal, you will receive your results only by: [place] (if you have MyChart) OR A paper copy in the mail If you have any lab test that is abnormal or we need to change your treatment, we will call you to review the results.  Testing/Procedures: None ordered today.  Follow-Up: At [facility], you and your health needs are our priority.  As part of our continuing mission to provide you with exceptional heart care, we have created designated Provider Care Teams.  These Care Teams include your primary Cardiologist (physician) and Advanced Practice Providers (APPs -  Physician Assistants and Nurse Practitioners) who all work together to provide you with the care you need, when you need it.  We recommend signing up for the patient portal called "MyChart".  Sign up information is provided on this After Visit Summary.  MyChart is used to connect with patients for Virtual Visits (Telemedicine).  Patients are able to view lab/test results, encounter notes, upcoming appointments, etc.  Non-urgent messages can be sent to your provider as well.   To learn more about what you can do with MyChart, go to [email or web address].    Your next appointment:   3 month(s)  The format for your next appointment:   In Person  Provider:   Jenkins Rouge, MD     Important Information About Sugar

## 2022-01-30 ENCOUNTER — Other Ambulatory Visit: Payer: Self-pay

## 2022-02-07 ENCOUNTER — Ambulatory Visit (INDEPENDENT_AMBULATORY_CARE_PROVIDER_SITE_OTHER): Payer: Medicare HMO | Admitting: Podiatry

## 2022-02-07 DIAGNOSIS — L97522 Non-pressure chronic ulcer of other part of left foot with fat layer exposed: Secondary | ICD-10-CM | POA: Diagnosis not present

## 2022-02-07 DIAGNOSIS — Z9889 Other specified postprocedural states: Secondary | ICD-10-CM

## 2022-02-07 MED ORDER — SANTYL 250 UNIT/GM EX OINT: 1.0000 | TOPICAL_OINTMENT | Freq: Every day | CUTANEOUS | 0 refills | Status: DC

## 2022-02-07 NOTE — Progress Notes (Signed)
Subjective:  Patient ID: [name], female    DOB: [birth year],  MRN: [id number]  No chief complaint on file.   DOS: 12/29/2021 Procedure: Left partial first ray amputation with excisional debridement of wound with application of skin graft  81 y.o. female returns for post-op check.  Patient states she is doing okay.  Minimal pain no acute complaints.  Review of Systems: Negative except as noted in the HPI. Denies N/V/F/Ch.  Past Medical History:  Diagnosis Date   Anemia 05/2014.   Atrial fibrillation ([place])    x 3 yrs   Breast cancer ([place])    S/P left mastectomy and chemotherapy 1989 remained in remission   Carotid artery occlusion    Carotid bruit    LICA 31-51% (duplex 7/61)   Degenerative arthritis of spine 2015   Chronic back pain   Hyperlipidemia    Hypertension    Meningioma ([place])    Osteoporosis    Pneumonia May 19, 2014   Stroke [facility])    CVA 2008   Subclavian steal syndrome    Ulcers of both lower extremities ([place]) 2015    Current Outpatient Medications:    collagenase (SANTYL) 250 UNIT/GM ointment, Apply 1 Application topically daily., Disp: 15 g, Rfl: 0   acetaminophen (TYLENOL) 325 MG tablet, Take 2 tablets (650 mg total) by mouth every 6 (six) hours as needed for mild pain (or Fever >/= 101)., Disp: , Rfl:    amiodarone (PACERONE) 200 MG tablet, Take 1 tablet (200 mg total) by mouth daily., Disp: 90 tablet, Rfl: 3   ascorbic acid (VITAMIN C) 500 MG tablet, Take 500 mg by mouth daily., Disp: , Rfl:    CALCIUM PO, Take 1,200 mg by mouth daily., Disp: , Rfl:    Cholecalciferol (VITAMIN D-3 PO), Take by mouth., Disp: , Rfl:    clopidogrel (PLAVIX) 75 MG tablet, TAKE 1 TABLET EVERY MORNING, Disp: 90 tablet, Rfl: 3   denosumab (PROLIA) 60 MG/ML SOSY injection, Inject 60 mg into the skin every 6 (six) months., Disp: , Rfl:    docusate sodium (COLACE) 100 MG capsule, Take 1 capsule (100 mg total) by mouth 2 (two) times daily., Disp: 10 capsule, Rfl: 0   ELIQUIS  2.5 MG TABS tablet, TAKE 1 TABLET TWICE A DAY, Disp: 180 tablet, Rfl: 1   Ferrous Sulfate (IRON PO), Take 65 mg by mouth daily at 6 (six) AM., Disp: , Rfl:    furosemide (LASIX) 20 MG tablet, Take 1 tablet (20 mg total) by mouth daily. Only as needed PRN, Disp: 30 tablet, Rfl:    MAGNESIUM OXIDE PO, Take 500 mg by mouth every morning., Disp: , Rfl:    metoprolol tartrate (LOPRESSOR) 25 MG tablet, TAKE 3 TABLETS TWICE A DAY, Disp: 540 tablet, Rfl: 3   Multiple Vitamin (MULTIVITAMIN WITH MINERALS) TABS tablet, Take 1 tablet by mouth every morning., Disp: , Rfl:    oxyCODONE (OXY IR/ROXICODONE) 5 MG immediate release tablet, Take 1 tablet (5 mg total) by mouth every 6 (six) hours as needed for moderate pain., Disp: 10 tablet, Rfl: 0   pantoprazole (PROTONIX) 40 MG tablet, TAKE 1 TABLET DAILY, Disp: 90 tablet, Rfl: 3   potassium chloride (KLOR-CON M) 10 MEQ tablet, Take 10 mEq by mouth daily., Disp: , Rfl:    pravastatin (PRAVACHOL) 40 MG tablet, Take 1 tablet (40 mg total) by mouth daily., Disp: 90 tablet, Rfl: 3   SANTYL 250 UNIT/GM ointment, APPLY TO AFFECTED AREA DAILY, Disp: 30 g, Rfl:  0   Tiotropium Bromide Monohydrate (SPIRIVA RESPIMAT) 2.5 MCG/ACT AERS, USE 2 INHALATIONS BY MOUTH  DAILY, Disp: 12 g, Rfl: 3  Social History   Tobacco Use  Smoking Status Former   Packs/day: 1.00   Years: 56.00   Total pack years: 56.00   Types: Cigarettes   Quit date: 08/14/2006   Years since quitting: 15.4   Passive exposure: Never  Smokeless Tobacco Never  Tobacco Comments   quit in 2008    Allergies  Allergen Reactions   Iodine Rash and Other (See Comments)    BETADINE Rash/burning, blisters on skin. Burns skin   Iohexol Rash and Other (See Comments)    Blisters; PT NEEDS 13-HOUR PREP    Povidone-Iodine Other (See Comments)    Burns skin   Tape Itching, Rash and Other (See Comments)    "DO NOT USE ADHESIVE TAPE" Not even a Band Aid" NO PAPER TAPE Burns skin Skin is very very thin    Objective:  There were no vitals filed for this visit. There is no height or weight on file to calculate BMI. Constitutional Well developed. Well nourished.  Vascular Foot warm and well perfused. Capillary refill normal to all digits.   Neurologic Normal speech. Oriented to person, place, and time. Epicritic sensation to light touch grossly present bilaterally.  Dermatologic Left dorsal foot wound with fat layer exposed.  Granular/fibrogranular wound base noted.  No malodor present no purulent drainage noted.  Orthopedic: No tenderness to palpation noted about the surgical site.   Radiographs: None Assessment:   No diagnosis found.   Plan:  Patient was evaluated and treated and all questions answered.  S/p foot surgery left -Clinic healed.  No signs of dehiscence noted.  No complications noted.  Left dorsal foot wound with fat layer exposed -Minimal debridement was carried out.  Patient does have granular with some fibrotic patches at the wound base.   -Continue Santyl wet-to-dry as there is more granulation tissue noted. -New surgical shoe was dispensed. -If there is no improvement we will discuss skin graft options. No follow-ups on file.    No follow-ups on file.

## 2022-02-21 ENCOUNTER — Other Ambulatory Visit: Payer: Self-pay | Admitting: Podiatry

## 2022-02-21 DIAGNOSIS — L97522 Non-pressure chronic ulcer of other part of left foot with fat layer exposed: Secondary | ICD-10-CM

## 2022-02-23 NOTE — Telephone Encounter (Signed)
Patient is calling for a refill on the Santyl, please advise or send to pharmacy on file.

## 2022-03-05 ENCOUNTER — Telehealth: Payer: Self-pay | Admitting: Cardiovascular Disease

## 2022-03-05 ENCOUNTER — Other Ambulatory Visit: Payer: Self-pay

## 2022-03-05 ENCOUNTER — Inpatient Hospital Stay (HOSPITAL_COMMUNITY)
Admission: EM | Admit: 2022-03-05 | Discharge: 2022-03-09 | DRG: 872 | Disposition: A | Discharge status: Home Health | Payer: Medicare HMO | Attending: Internal Medicine | Admitting: Internal Medicine

## 2022-03-05 ENCOUNTER — Encounter (HOSPITAL_COMMUNITY): Payer: Self-pay | Admitting: Emergency Medicine

## 2022-03-05 DIAGNOSIS — Z9013 Acquired absence of bilateral breasts and nipples: Secondary | ICD-10-CM

## 2022-03-05 DIAGNOSIS — I5032 Chronic diastolic (congestive) heart failure: Secondary | ICD-10-CM | POA: Diagnosis present

## 2022-03-05 DIAGNOSIS — Z83438 Family history of other disorder of lipoprotein metabolism and other lipidemia: Secondary | ICD-10-CM

## 2022-03-05 DIAGNOSIS — Z8673 Personal history of transient ischemic attack (TIA), and cerebral infarction without residual deficits: Secondary | ICD-10-CM

## 2022-03-05 DIAGNOSIS — Z882 Allergy status to sulfonamides status: Secondary | ICD-10-CM

## 2022-03-05 DIAGNOSIS — G250 Essential tremor: Secondary | ICD-10-CM | POA: Diagnosis present

## 2022-03-05 DIAGNOSIS — E1151 Type 2 diabetes mellitus with diabetic peripheral angiopathy without gangrene: Secondary | ICD-10-CM | POA: Diagnosis present

## 2022-03-05 DIAGNOSIS — A419 Sepsis, unspecified organism: Principal | ICD-10-CM | POA: Diagnosis present

## 2022-03-05 DIAGNOSIS — E1121 Type 2 diabetes mellitus with diabetic nephropathy: Secondary | ICD-10-CM | POA: Diagnosis present

## 2022-03-05 DIAGNOSIS — Z7901 Long term (current) use of anticoagulants: Secondary | ICD-10-CM

## 2022-03-05 DIAGNOSIS — Z86011 Personal history of benign neoplasm of the brain: Secondary | ICD-10-CM

## 2022-03-05 DIAGNOSIS — K219 Gastro-esophageal reflux disease without esophagitis: Secondary | ICD-10-CM | POA: Diagnosis present

## 2022-03-05 DIAGNOSIS — R251 Tremor, unspecified: Secondary | ICD-10-CM | POA: Diagnosis present

## 2022-03-05 DIAGNOSIS — I110 Hypertensive heart disease with heart failure: Secondary | ICD-10-CM | POA: Diagnosis present

## 2022-03-05 DIAGNOSIS — Z91048 Other nonmedicinal substance allergy status: Secondary | ICD-10-CM

## 2022-03-05 DIAGNOSIS — R0682 Tachypnea, not elsewhere classified: Secondary | ICD-10-CM | POA: Diagnosis present

## 2022-03-05 DIAGNOSIS — Z923 Personal history of irradiation: Secondary | ICD-10-CM

## 2022-03-05 DIAGNOSIS — L03032 Cellulitis of left toe: Secondary | ICD-10-CM | POA: Insufficient documentation

## 2022-03-05 DIAGNOSIS — Z89422 Acquired absence of other left toe(s): Secondary | ICD-10-CM

## 2022-03-05 DIAGNOSIS — Z9221 Personal history of antineoplastic chemotherapy: Secondary | ICD-10-CM

## 2022-03-05 DIAGNOSIS — I4819 Other persistent atrial fibrillation: Secondary | ICD-10-CM | POA: Diagnosis present

## 2022-03-05 DIAGNOSIS — M810 Age-related osteoporosis without current pathological fracture: Secondary | ICD-10-CM | POA: Diagnosis present

## 2022-03-05 DIAGNOSIS — L02612 Cutaneous abscess of left foot: Secondary | ICD-10-CM | POA: Insufficient documentation

## 2022-03-05 DIAGNOSIS — Z820 Family history of epilepsy and other diseases of the nervous system: Secondary | ICD-10-CM

## 2022-03-05 DIAGNOSIS — Z8249 Family history of ischemic heart disease and other diseases of the circulatory system: Secondary | ICD-10-CM

## 2022-03-05 DIAGNOSIS — L03116 Cellulitis of left lower limb: Secondary | ICD-10-CM | POA: Diagnosis present

## 2022-03-05 DIAGNOSIS — E440 Moderate protein-calorie malnutrition: Secondary | ICD-10-CM | POA: Diagnosis present

## 2022-03-05 DIAGNOSIS — I739 Peripheral vascular disease, unspecified: Secondary | ICD-10-CM | POA: Diagnosis present

## 2022-03-05 DIAGNOSIS — I480 Paroxysmal atrial fibrillation: Secondary | ICD-10-CM | POA: Diagnosis present

## 2022-03-05 DIAGNOSIS — Z888 Allergy status to other drugs, medicaments and biological substances status: Secondary | ICD-10-CM

## 2022-03-05 DIAGNOSIS — Z681 Body mass index (BMI) 19 or less, adult: Secondary | ICD-10-CM

## 2022-03-05 DIAGNOSIS — Z87891 Personal history of nicotine dependence: Secondary | ICD-10-CM

## 2022-03-05 DIAGNOSIS — Z66 Do not resuscitate: Secondary | ICD-10-CM | POA: Diagnosis present

## 2022-03-05 DIAGNOSIS — Z1152 Encounter for screening for COVID-19: Secondary | ICD-10-CM

## 2022-03-05 DIAGNOSIS — I4891 Unspecified atrial fibrillation: Secondary | ICD-10-CM | POA: Diagnosis present

## 2022-03-05 DIAGNOSIS — I10 Essential (primary) hypertension: Secondary | ICD-10-CM | POA: Diagnosis present

## 2022-03-05 DIAGNOSIS — R6520 Severe sepsis without septic shock: Secondary | ICD-10-CM | POA: Diagnosis present

## 2022-03-05 DIAGNOSIS — R6510 Systemic inflammatory response syndrome (SIRS) of non-infectious origin without acute organ dysfunction: Principal | ICD-10-CM | POA: Diagnosis present

## 2022-03-05 DIAGNOSIS — E785 Hyperlipidemia, unspecified: Secondary | ICD-10-CM | POA: Diagnosis present

## 2022-03-05 DIAGNOSIS — Z79899 Other long term (current) drug therapy: Secondary | ICD-10-CM

## 2022-03-05 DIAGNOSIS — Z7902 Long term (current) use of antithrombotics/antiplatelets: Secondary | ICD-10-CM

## 2022-03-05 DIAGNOSIS — Z853 Personal history of malignant neoplasm of breast: Secondary | ICD-10-CM

## 2022-03-05 LAB — CBG MONITORING, ED: Glucose-Capillary: 122 mg/dL — ABNORMAL HIGH (ref 70–99)

## 2022-03-05 NOTE — Telephone Encounter (Signed)
Spoke with patient and her husband to let them know [name] wants her to stop amiodarone and follow up with neurology and her PCP. They verbalized understanding.

## 2022-03-05 NOTE — Telephone Encounter (Signed)
Spoke with patient's husband who states the patient's tremors have worsened to the point she has difficulty feeding self. He wants to know if her medications can be adjusted, if the amiodarone stopped as this seems to be making the tremors worse.   Advised I would forward his concerns to Dr Johnsie Cancel and his nurse for review.

## 2022-03-05 NOTE — ED Provider Notes (Incomplete)
[facility] EMERGENCY DEPARTMENT Provider Note   CSN: [id number] Arrival date & time: 03/05/22  1907     History {Add pertinent medical, surgical, social history, OB history to HPI:1} Chief Complaint  Patient presents with   Tremors    [name] is a 81 y.o. female.  Patient presents to the emergency department for evaluation of worsening tremors.  Patient has a history of essential tremor but over the last several weeks that has progressively been getting worse.  Over the last several days she has been having trouble eating and drinking because of the severity of her tremor.  She called her cardiologist today because she suspected amiodarone as the cause.  He did tell her that she could stop the amiodarone, has not had a dose today.  Symptoms got quite severe this evening but currently she is back to her baseline.       Home Medications Prior to Admission medications   Medication Sig Start Date End Date Taking? Authorizing Provider  acetaminophen (TYLENOL) 325 MG tablet Take 2 tablets (650 mg total) by mouth every 6 (six) hours as needed for mild pain (or Fever >/= 101). 01/02/22   Oswald Hillock, MD  amiodarone (PACERONE) 200 MG tablet Take 1 tablet (200 mg total) by mouth daily. 01/16/22   Josue Hector, MD  ascorbic acid (VITAMIN C) 500 MG tablet Take 500 mg by mouth daily.    [provider]  CALCIUM PO Take 1,200 mg by mouth daily.    [provider]  Cholecalciferol (VITAMIN D-3 PO) Take by mouth.    [provider]  clopidogrel (PLAVIX) 75 MG tablet TAKE 1 TABLET EVERY MORNING 08/31/21   Josue Hector, MD  collagenase (SANTYL) 250 UNIT/GM ointment Apply 1 Application topically daily. 02/07/22   Felipa Furnace, DPM  denosumab (PROLIA) 60 MG/ML SOSY injection Inject 60 mg into the skin every 6 (six) months.    Celene Squibb, MD  docusate sodium (COLACE) 100 MG capsule Take 1 capsule (100 mg total) by mouth 2 (two) times daily. 01/02/22   Oswald Hillock, MD  ELIQUIS 2.5 MG TABS tablet TAKE 1 TABLET TWICE A DAY 11/23/21   Josue Hector, MD  Ferrous Sulfate (IRON PO) Take 65 mg by mouth daily at 6 (six) AM.    [provider]  furosemide (LASIX) 20 MG tablet Take 1 tablet (20 mg total) by mouth daily. Only as needed PRN 01/02/22   Oswald Hillock, MD  MAGNESIUM OXIDE PO Take 500 mg by mouth every morning.    [provider]  metoprolol tartrate (LOPRESSOR) 25 MG tablet TAKE 3 TABLETS TWICE A DAY 10/06/21   Josue Hector, MD  Multiple Vitamin (MULTIVITAMIN WITH MINERALS) TABS tablet Take 1 tablet by mouth every morning.    [provider]  oxyCODONE (OXY IR/ROXICODONE) 5 MG immediate release tablet Take 1 tablet (5 mg total) by mouth every 6 (six) hours as needed for moderate pain. 01/02/22   Oswald Hillock, MD  pantoprazole (PROTONIX) 40 MG tablet TAKE 1 TABLET DAILY 08/31/21   Josue Hector, MD  potassium chloride (KLOR-CON M) 10 MEQ tablet Take 10 mEq by mouth daily.    [provider]  pravastatin (PRAVACHOL) 40 MG tablet Take 1 tablet (40 mg total) by mouth daily. 07/20/21   Josue Hector, MD  SANTYL 250 UNIT/GM ointment APPLY TO AFFECTED AREA DAILY 01/23/22   Felipa Furnace, DPM  Tiotropium  Bromide Monohydrate (SPIRIVA RESPIMAT) 2.5 MCG/ACT AERS USE 2 INHALATIONS BY MOUTH  DAILY 10/25/21   Cobb, Karie Schwalbe, NP      Allergies    Iodine, Iohexol, Povidone-iodine, and Tape    Review of Systems   Review of Systems  Physical Exam Updated Vital Signs BP 119/71   Pulse 100   Temp 98.2 F (36.8 C) (Oral)   Resp 18   Ht 5\' 2"  (1.575 m)   Wt 45.3 kg   SpO2 96%   BMI 18.27 kg/m  Physical Exam Vitals and nursing note reviewed.  Constitutional:      General: She is not in acute distress.    Appearance: She is well-developed.  HENT:     Head: Normocephalic and atraumatic.     Mouth/Throat:     Mouth: Mucous membranes are moist.  Eyes:     General: Vision grossly intact. Gaze aligned  appropriately.     Extraocular Movements: Extraocular movements intact.     Conjunctiva/sclera: Conjunctivae normal.  Cardiovascular:     Rate and Rhythm: Normal rate and regular rhythm.     Pulses: Normal pulses.     Heart sounds: Normal heart sounds, S1 normal and S2 normal. No murmur heard.    No friction rub. No gallop.  Pulmonary:     Effort: Pulmonary effort is normal. No respiratory distress.     Breath sounds: Normal breath sounds.  Abdominal:     General: Bowel sounds are normal.     Palpations: Abdomen is soft.     Tenderness: There is no abdominal tenderness. There is no guarding or rebound.     Hernia: No hernia is present.  Musculoskeletal:        General: No swelling.     Cervical back: Full passive range of motion without pain, normal range of motion and neck supple. No spinous process tenderness or muscular tenderness. Normal range of motion.     Right lower leg: No edema.     Left lower leg: No edema.  Skin:    General: Skin is warm and dry.     Capillary Refill: Capillary refill takes less than 2 seconds.     Findings: No ecchymosis, erythema, rash or wound.  Neurological:     General: No focal deficit present.     Mental Status: She is alert and oriented to person, place, and time.     GCS: GCS eye subscore is 4. GCS verbal subscore is 5. GCS motor subscore is 6.     Cranial Nerves: Cranial nerves 2-12 are intact.     Sensory: Sensation is intact.     Motor: Motor function is intact.     Coordination: Coordination is intact.  Psychiatric:        Attention and Perception: Attention normal.        Mood and Affect: Mood normal.        Speech: Speech normal.        Behavior: Behavior normal.     ED Results / Procedures / Treatments   Labs (all labs ordered are listed, but only abnormal results are displayed) Labs Reviewed  CBG MONITORING, ED - Abnormal; Notable for the following components:      Result Value   Glucose-Capillary 122 (*)    All other  components within normal limits  CBC WITH DIFFERENTIAL/PLATELET  COMPREHENSIVE METABOLIC PANEL  URINALYSIS, ROUTINE W REFLEX MICROSCOPIC    EKG None  Radiology No results found.  Procedures Procedures  {Document  cardiac monitor, telemetry assessment procedure when appropriate:1}  Medications Ordered in ED Medications - No data to display  ED Course/ Medical Decision Making/ A&P                           Medical Decision Making Amount and/or Complexity of Data Reviewed Labs: ordered.   ***  {Document critical care time when appropriate:1} {Document review of labs and clinical decision tools ie heart score, Chads2Vasc2 etc:1}  {Document your independent review of radiology images, and any outside records:1} {Document your discussion with family members, caretakers, and with consultants:1} {Document social determinants of health affecting pt's care:1} {Document your decision making why or why not admission, treatments were needed:1} Final Clinical Impression(s) / ED Diagnoses Final diagnoses:  None    Rx / DC Orders ED Discharge Orders     None

## 2022-03-05 NOTE — ED Triage Notes (Signed)
Pt has tremors and husband states they are worse today. She thinks she is having a reaction to amiodarone.

## 2022-03-05 NOTE — Telephone Encounter (Signed)
  Pt c/o medication issue:  1. Name of Medication: Amiodarone  2. How are you currently taking this medication (dosage and times per day)? 1 time a day  3. Are you having a reaction (difficulty breathing--STAT)?   4. What is your medication issue? Having  essential tremors are back, so bad. She can not write or hardly feed herself,

## 2022-03-05 NOTE — ED Notes (Signed)
Lab at bedside

## 2022-03-06 ENCOUNTER — Emergency Department (HOSPITAL_COMMUNITY): Payer: Medicare HMO

## 2022-03-06 DIAGNOSIS — I10 Essential (primary) hypertension: Secondary | ICD-10-CM

## 2022-03-06 DIAGNOSIS — Z681 Body mass index (BMI) 19 or less, adult: Secondary | ICD-10-CM | POA: Diagnosis not present

## 2022-03-06 DIAGNOSIS — Z66 Do not resuscitate: Secondary | ICD-10-CM | POA: Diagnosis present

## 2022-03-06 DIAGNOSIS — Z86011 Personal history of benign neoplasm of the brain: Secondary | ICD-10-CM | POA: Diagnosis not present

## 2022-03-06 DIAGNOSIS — I739 Peripheral vascular disease, unspecified: Secondary | ICD-10-CM

## 2022-03-06 DIAGNOSIS — E1151 Type 2 diabetes mellitus with diabetic peripheral angiopathy without gangrene: Secondary | ICD-10-CM | POA: Diagnosis present

## 2022-03-06 DIAGNOSIS — Z89422 Acquired absence of other left toe(s): Secondary | ICD-10-CM | POA: Diagnosis not present

## 2022-03-06 DIAGNOSIS — R6520 Severe sepsis without septic shock: Secondary | ICD-10-CM | POA: Diagnosis present

## 2022-03-06 DIAGNOSIS — L03116 Cellulitis of left lower limb: Secondary | ICD-10-CM | POA: Diagnosis present

## 2022-03-06 DIAGNOSIS — A419 Sepsis, unspecified organism: Secondary | ICD-10-CM

## 2022-03-06 DIAGNOSIS — I110 Hypertensive heart disease with heart failure: Secondary | ICD-10-CM | POA: Diagnosis present

## 2022-03-06 DIAGNOSIS — L03032 Cellulitis of left toe: Secondary | ICD-10-CM | POA: Diagnosis not present

## 2022-03-06 DIAGNOSIS — R6510 Systemic inflammatory response syndrome (SIRS) of non-infectious origin without acute organ dysfunction: Secondary | ICD-10-CM

## 2022-03-06 DIAGNOSIS — M810 Age-related osteoporosis without current pathological fracture: Secondary | ICD-10-CM | POA: Diagnosis present

## 2022-03-06 DIAGNOSIS — Z8673 Personal history of transient ischemic attack (TIA), and cerebral infarction without residual deficits: Secondary | ICD-10-CM | POA: Diagnosis not present

## 2022-03-06 DIAGNOSIS — Z8249 Family history of ischemic heart disease and other diseases of the circulatory system: Secondary | ICD-10-CM | POA: Diagnosis not present

## 2022-03-06 DIAGNOSIS — E440 Moderate protein-calorie malnutrition: Secondary | ICD-10-CM | POA: Diagnosis present

## 2022-03-06 DIAGNOSIS — Z1152 Encounter for screening for COVID-19: Secondary | ICD-10-CM | POA: Diagnosis not present

## 2022-03-06 DIAGNOSIS — K219 Gastro-esophageal reflux disease without esophagitis: Secondary | ICD-10-CM

## 2022-03-06 DIAGNOSIS — Z83438 Family history of other disorder of lipoprotein metabolism and other lipidemia: Secondary | ICD-10-CM | POA: Diagnosis not present

## 2022-03-06 DIAGNOSIS — I5032 Chronic diastolic (congestive) heart failure: Secondary | ICD-10-CM

## 2022-03-06 DIAGNOSIS — I480 Paroxysmal atrial fibrillation: Secondary | ICD-10-CM

## 2022-03-06 DIAGNOSIS — E1121 Type 2 diabetes mellitus with diabetic nephropathy: Secondary | ICD-10-CM | POA: Diagnosis present

## 2022-03-06 DIAGNOSIS — G250 Essential tremor: Secondary | ICD-10-CM | POA: Diagnosis present

## 2022-03-06 DIAGNOSIS — Z853 Personal history of malignant neoplasm of breast: Secondary | ICD-10-CM | POA: Diagnosis not present

## 2022-03-06 DIAGNOSIS — E785 Hyperlipidemia, unspecified: Secondary | ICD-10-CM

## 2022-03-06 DIAGNOSIS — R251 Tremor, unspecified: Secondary | ICD-10-CM

## 2022-03-06 DIAGNOSIS — Z87891 Personal history of nicotine dependence: Secondary | ICD-10-CM | POA: Diagnosis not present

## 2022-03-06 DIAGNOSIS — Z820 Family history of epilepsy and other diseases of the nervous system: Secondary | ICD-10-CM | POA: Diagnosis not present

## 2022-03-06 LAB — COMPREHENSIVE METABOLIC PANEL
ALT: 12 U/L (ref 0–44)
AST: 20 U/L (ref 15–41)
Albumin: 3.4 g/dL — ABNORMAL LOW (ref 3.5–5.0)
Alkaline Phosphatase: 56 U/L (ref 38–126)
Anion gap: 11 (ref 5–15)
BUN: 24 mg/dL — ABNORMAL HIGH (ref 8–23)
CO2: 24 mmol/L (ref 22–32)
Calcium: 9.1 mg/dL (ref 8.9–10.3)
Chloride: 101 mmol/L (ref 98–111)
Creatinine, Ser: 1.15 mg/dL — ABNORMAL HIGH (ref 0.44–1.00)
GFR, Estimated: 47 mL/min — ABNORMAL LOW (ref >60)
Glucose, Bld: 95 mg/dL (ref 70–99)
Potassium: 4.4 mmol/L (ref 3.5–5.1)
Sodium: 136 mmol/L (ref 135–145)
Total Bilirubin: 0.7 mg/dL (ref 0.3–1.2)
Total Protein: 7.3 g/dL (ref 6.5–8.1)

## 2022-03-06 LAB — BLOOD GAS, ARTERIAL
Acid-Base Excess: 0.9 mmol/L (ref 0.0–2.0)
Bicarbonate: 21.9 mmol/L (ref 20.0–28.0)
Drawn by: 21310
O2 Saturation: 99.5 %
Patient temperature: 37.3
pCO2 arterial: 25 mmHg — ABNORMAL LOW (ref 32–48)
pH, Arterial: 7.54 — ABNORMAL HIGH (ref 7.35–7.45)
pO2, Arterial: 198 mmHg — ABNORMAL HIGH (ref 83–108)

## 2022-03-06 LAB — CBC WITH DIFFERENTIAL/PLATELET
Abs Immature Granulocytes: 0.31 10*3/uL — ABNORMAL HIGH (ref 0.00–0.07)
Basophils Absolute: 0.1 10*3/uL (ref 0.0–0.1)
Basophils Relative: 0 %
Eosinophils Absolute: 0 10*3/uL (ref 0.0–0.5)
Eosinophils Relative: 0 %
HCT: 36.1 % (ref 36.0–46.0)
Hemoglobin: 11.7 g/dL — ABNORMAL LOW (ref 12.0–15.0)
Immature Granulocytes: 1 %
Lymphocytes Relative: 2 %
Lymphs Abs: 0.6 10*3/uL — ABNORMAL LOW (ref 0.7–4.0)
MCH: 32.5 pg (ref 26.0–34.0)
MCHC: 32.4 g/dL (ref 30.0–36.0)
MCV: 100.3 fL — ABNORMAL HIGH (ref 80.0–100.0)
Monocytes Absolute: 0.8 10*3/uL (ref 0.1–1.0)
Monocytes Relative: 2 %
Neutro Abs: 32.6 10*3/uL — ABNORMAL HIGH (ref 1.7–7.7)
Neutrophils Relative %: 95 %
Platelets: 327 10*3/uL (ref 150–400)
RBC: 3.6 MIL/uL — ABNORMAL LOW (ref 3.87–5.11)
RDW: 15 % (ref 11.5–15.5)
WBC: 34.5 10*3/uL — ABNORMAL HIGH (ref 4.0–10.5)
nRBC: 0 % (ref 0.0–0.2)

## 2022-03-06 LAB — SEDIMENTATION RATE: Sed Rate: 52 mm/hr — ABNORMAL HIGH (ref 0–22)

## 2022-03-06 LAB — C-REACTIVE PROTEIN: CRP: 13.3 mg/dL — ABNORMAL HIGH (ref <1.0)

## 2022-03-06 LAB — LACTIC ACID, PLASMA
Lactic Acid, Venous: 2.5 mmol/L (ref 0.5–1.9)
Lactic Acid, Venous: 1.6 mmol/L (ref 0.5–1.9)

## 2022-03-06 LAB — URINALYSIS, ROUTINE W REFLEX MICROSCOPIC
Bacteria, UA: NONE SEEN
Bilirubin Urine: NEGATIVE
Glucose, UA: NEGATIVE mg/dL
Hgb urine dipstick: NEGATIVE
Ketones, ur: 5 mg/dL — AB
Leukocytes,Ua: NEGATIVE
Nitrite: NEGATIVE
Protein, ur: 100 mg/dL — AB
Specific Gravity, Urine: 1.025 (ref 1.005–1.030)
pH: 6 (ref 5.0–8.0)

## 2022-03-06 LAB — TSH: TSH: 5.333 u[IU]/mL — ABNORMAL HIGH (ref 0.350–4.500)

## 2022-03-06 LAB — PHOSPHORUS: Phosphorus: 2.2 mg/dL — ABNORMAL LOW (ref 2.5–4.6)

## 2022-03-06 LAB — BRAIN NATRIURETIC PEPTIDE: B Natriuretic Peptide: 1274 pg/mL — ABNORMAL HIGH (ref 0.0–100.0)

## 2022-03-06 LAB — PREALBUMIN: Prealbumin: 19 mg/dL (ref 18–38)

## 2022-03-06 LAB — RESP PANEL BY RT-PCR (FLU A&B, COVID) ARPGX2
Influenza A by PCR: NEGATIVE
Influenza B by PCR: NEGATIVE
SARS Coronavirus 2 by RT PCR: NEGATIVE

## 2022-03-06 LAB — MAGNESIUM: Magnesium: 2.2 mg/dL (ref 1.7–2.4)

## 2022-03-06 LAB — HEMOGLOBIN A1C
Hgb A1c MFr Bld: 5.2 % (ref 4.8–5.6)
Mean Plasma Glucose: 103 mg/dL

## 2022-03-06 MED ORDER — ADULT MULTIVITAMIN W/MINERALS CH
1.0000 | ORAL_TABLET | Freq: Every morning | ORAL | Status: DC
  Administered 2022-03-06 – 2022-03-09 (×4): 1 via ORAL
  Filled 2022-03-06 (×4): qty 1

## 2022-03-06 MED ORDER — ACETAMINOPHEN 325 MG PO TABS
650.0000 mg | ORAL_TABLET | Freq: Four times a day (QID) | ORAL | Status: DC | PRN
  Administered 2022-03-06 – 2022-03-08 (×2): 650 mg via ORAL
  Filled 2022-03-06 (×2): qty 2

## 2022-03-06 MED ORDER — VANCOMYCIN HCL 750 MG/150ML IV SOLN
750.0000 mg | INTRAVENOUS | Status: DC
  Administered 2022-03-08: 750 mg via INTRAVENOUS
  Filled 2022-03-06: qty 150

## 2022-03-06 MED ORDER — FUROSEMIDE 20 MG PO TABS
20.0000 mg | ORAL_TABLET | Freq: Every day | ORAL | Status: DC
  Administered 2022-03-06 – 2022-03-09 (×4): 20 mg via ORAL
  Filled 2022-03-06 (×4): qty 1

## 2022-03-06 MED ORDER — CLOPIDOGREL BISULFATE 75 MG PO TABS
75.0000 mg | ORAL_TABLET | Freq: Every morning | ORAL | Status: DC
  Administered 2022-03-06 – 2022-03-09 (×4): 75 mg via ORAL
  Filled 2022-03-06 (×4): qty 1

## 2022-03-06 MED ORDER — APIXABAN 2.5 MG PO TABS
2.5000 mg | ORAL_TABLET | Freq: Two times a day (BID) | ORAL | Status: DC
  Administered 2022-03-06 – 2022-03-09 (×7): 2.5 mg via ORAL
  Filled 2022-03-06 (×8): qty 1

## 2022-03-06 MED ORDER — UMECLIDINIUM BROMIDE 62.5 MCG/ACT IN AEPB
1.0000 | INHALATION_SPRAY | Freq: Every day | RESPIRATORY_TRACT | Status: DC
  Administered 2022-03-07 – 2022-03-09 (×3): 1 via RESPIRATORY_TRACT
  Filled 2022-03-06: qty 7

## 2022-03-06 MED ORDER — VANCOMYCIN HCL IN DEXTROSE 1-5 GM/200ML-% IV SOLN
1000.0000 mg | Freq: Once | INTRAVENOUS | Status: AC
  Administered 2022-03-06: 1000 mg via INTRAVENOUS
  Filled 2022-03-06: qty 200

## 2022-03-06 MED ORDER — METRONIDAZOLE 500 MG/100ML IV SOLN
500.0000 mg | Freq: Once | INTRAVENOUS | Status: AC
  Administered 2022-03-06: 500 mg via INTRAVENOUS
  Filled 2022-03-06: qty 100

## 2022-03-06 MED ORDER — PRAVASTATIN SODIUM 40 MG PO TABS
40.0000 mg | ORAL_TABLET | Freq: Every day | ORAL | Status: DC
  Administered 2022-03-07 – 2022-03-09 (×3): 40 mg via ORAL
  Filled 2022-03-06 (×3): qty 1

## 2022-03-06 MED ORDER — LACTATED RINGERS IV SOLN: INTRAVENOUS | Status: AC

## 2022-03-06 MED ORDER — ONDANSETRON HCL 4 MG PO TABS: 4.0000 mg | ORAL_TABLET | Freq: Four times a day (QID) | ORAL | Status: DC | PRN

## 2022-03-06 MED ORDER — ACETAMINOPHEN 325 MG PO TABS
650.0000 mg | ORAL_TABLET | Freq: Once | ORAL | Status: AC
  Administered 2022-03-06: 650 mg via ORAL
  Filled 2022-03-06: qty 2

## 2022-03-06 MED ORDER — OXYCODONE HCL 5 MG PO TABS: 5.0000 mg | ORAL_TABLET | Freq: Three times a day (TID) | ORAL | Status: DC | PRN

## 2022-03-06 MED ORDER — SODIUM CHLORIDE 0.9 % IV SOLN
2.0000 g | Freq: Once | INTRAVENOUS | Status: AC
  Administered 2022-03-06: 2 g via INTRAVENOUS
  Filled 2022-03-06: qty 12.5

## 2022-03-06 MED ORDER — COLLAGENASE 250 UNIT/GM EX OINT
TOPICAL_OINTMENT | Freq: Every day | CUTANEOUS | Status: DC
  Filled 2022-03-06: qty 30

## 2022-03-06 MED ORDER — SODIUM CHLORIDE 0.9 % IV SOLN
2.0000 g | INTRAVENOUS | Status: DC
  Administered 2022-03-07 – 2022-03-09 (×3): 2 g via INTRAVENOUS
  Filled 2022-03-06 (×3): qty 20

## 2022-03-06 MED ORDER — LACTATED RINGERS IV BOLUS (SEPSIS)
1000.0000 mL | Freq: Once | INTRAVENOUS | Status: AC
  Administered 2022-03-06: 1000 mL via INTRAVENOUS

## 2022-03-06 MED ORDER — POTASSIUM CHLORIDE CRYS ER 10 MEQ PO TBCR
10.0000 meq | EXTENDED_RELEASE_TABLET | Freq: Every day | ORAL | Status: DC
  Administered 2022-03-06 – 2022-03-09 (×4): 10 meq via ORAL
  Filled 2022-03-06 (×4): qty 1

## 2022-03-06 MED ORDER — PRIMIDONE 50 MG PO TABS
12.5000 mg | ORAL_TABLET | Freq: Every day | ORAL | Status: DC
  Administered 2022-03-06: 12.5 mg via ORAL
  Filled 2022-03-06: qty 1

## 2022-03-06 MED ORDER — ONDANSETRON HCL 4 MG/2ML IJ SOLN: 4.0000 mg | Freq: Four times a day (QID) | INTRAMUSCULAR | Status: DC | PRN

## 2022-03-06 MED ORDER — PANTOPRAZOLE SODIUM 40 MG PO TBEC
40.0000 mg | DELAYED_RELEASE_TABLET | Freq: Every day | ORAL | Status: DC
  Administered 2022-03-06 – 2022-03-09 (×4): 40 mg via ORAL
  Filled 2022-03-06 (×4): qty 1

## 2022-03-06 MED ORDER — ACETAMINOPHEN 650 MG RE SUPP: 650.0000 mg | Freq: Four times a day (QID) | RECTAL | Status: DC | PRN

## 2022-03-06 MED ORDER — METOPROLOL TARTRATE 50 MG PO TABS
75.0000 mg | ORAL_TABLET | Freq: Two times a day (BID) | ORAL | Status: DC
  Administered 2022-03-07 – 2022-03-09 (×6): 75 mg via ORAL
  Filled 2022-03-06 (×7): qty 1

## 2022-03-06 NOTE — ED Notes (Signed)
Pt's left leg is warmer to touch vs right leg. Pt's dressing on left foot removed and wounds assessed. Foot, ankle, into calf is warm to touch and pink/red in color. EDP assessed left foot

## 2022-03-06 NOTE — Assessment & Plan Note (Signed)
Continue statin. 

## 2022-03-06 NOTE — ED Notes (Signed)
X-ray at bedside

## 2022-03-06 NOTE — Assessment & Plan Note (Signed)
Continue PPI ?

## 2022-03-06 NOTE — Assessment & Plan Note (Signed)
-  Status post left foot first ray amputation; ongoing non-healing wound in her left dorsal aspect (with purulence discharge currently) -Continue follow-up with Dr. Posey Pronto.  (Podiatrist). -Wound care service consultation for recommendations requested. -Follow clinical response.

## 2022-03-06 NOTE — Assessment & Plan Note (Signed)
-  Stable overall -Continue the use of metoprolol and resume home dose Lasix. -Follow-up vital signs.

## 2022-03-06 NOTE — Assessment & Plan Note (Signed)
-  Continue treatment with mysoline  -Outpatient follow-up with neurology service recommended.

## 2022-03-06 NOTE — ED Notes (Signed)
Pt's spouse asking why we need a UA. Pt educated regarding needing a UA sample.

## 2022-03-06 NOTE — Sepsis Progress Note (Signed)
Sepsis protocol is being followed by [facility]. 

## 2022-03-06 NOTE — ED Notes (Signed)
RT at bedside.

## 2022-03-06 NOTE — ED Notes (Signed)
Pt's IV infiltrated. Rest of Flagyl and LR on hold until another IV established

## 2022-03-06 NOTE — ED Notes (Signed)
Pt taken off of NRB per EDP orders. Care continued

## 2022-03-06 NOTE — Progress Notes (Signed)
Pharmacy Antibiotic Note  [name] is a 81 y.o. female admitted on 03/05/2022 with  wound infection .  Pharmacy has been consulted for vancomycin dosing.  Plan: Vancomycin 1000 mg IV x 1 dose. Vancomycin 750 mg IV every 48 hours. Monitor labs, c/s, and vanco level as indicated.  Height: 5\' 2"  (157.5 cm) Weight: 45.3 kg (99 lb 13.9 oz) IBW/kg (Calculated) : 50.1  Temp (24hrs), Avg:99.3 F (37.4 C), Min:97.7 F (36.5 C), Max:101 F (38.3 C)  Recent Labs  Lab 03/05/22 2318 03/06/22 0243 03/06/22 0514  WBC 34.5*  --   --   CREATININE 1.15*  --   --   LATICACIDVEN  --  2.5* 1.6    Estimated Creatinine Clearance: 26.5 mL/min (A) (by C-G formula based on SCr of 1.15 mg/dL (H)).    Allergies  Allergen Reactions   Iodine Rash and Other (See Comments)    BETADINE Rash/burning, blisters on skin. Burns skin   Iohexol Rash and Other (See Comments)    Blisters; PT NEEDS 13-HOUR PREP    Povidone-Iodine Other (See Comments)    Burns skin   Tape Itching, Rash and Other (See Comments)    "DO NOT USE ADHESIVE TAPE" Not even a Band Aid" NO PAPER TAPE Burns skin Skin is very very thin    Antimicrobials this admission: Vanco 11/28 >> CTX 11/28 >> Flagyl 11/28  Microbiology results: 11/28 BCx: pending 11/28 UCx: pending    Thank you for allowing pharmacy to be a part of this patient's care.  Margot Ables, PharmD Clinical Pharmacist 03/06/2022 11:26 AM

## 2022-03-06 NOTE — ED Notes (Signed)
Pt's O2 sat dropped to 90%. Pt placed on 2L [place]. Pt's O@ sat at 93%

## 2022-03-06 NOTE — H&P (Signed)
History and Physical    Patient: [name] JOI:[id number] DOB: 11-14-1940 DOA: 03/05/2022 DOS: the patient was seen and examined on 03/06/2022 PCP: Celene Squibb, MD  Patient coming from: Home  Chief Complaint:  Chief Complaint  Patient presents with   Tremors   HPI: [name] is a 81 y.o. female with medical history significant of atrial fibrillation, essential tremors, moderate protein-calorie malnutrition, hypertension, hyperlipidemia, history of stroke, type 2 diabetes mellitus with nephropathy and peripheral arterial disease; who presented to the hospital secondary to febrile illness and worsening tremors.  Symptoms have been present for the last 7-10 days initially thought to be secondary to use of amiodarone, cardiology service was contacted with instructions to stop amiodarone also future follow-up and to have herself checked with PCP and neurology.  Despite stopping medication symptoms continue to worsen and patient presented to the emergency department for further evaluation and management.  Patient denies chest pain, nausea, vomiting, shortness of breath, sick contacts, new focal deficits or any other complaints.  In the ED workup positive for severe sepsis and what appears to be left foot cellulitis; patient with ongoing nonhealing wound in the dorsal aspect of her foot with positive other and purulent discharge.  Patient may criteria for sepsis on admission with tachypnea, tachycardia, fever, elevated WBCs and elevated lactic acid.  Fluid resuscitation per sepsis protocol initiated, patient started on IV antibiotics after cultures taken; TRH consulted to place patient in the hospital for further evaluation and management.  Review of Systems: As mentioned in the history of present illness. All other systems reviewed and are negative. Past Medical History:  Diagnosis Date   Anemia 05/2014.   Atrial fibrillation ([place])    x 3 yrs   Breast cancer ([place])    S/P left mastectomy  and chemotherapy 1989 remained in remission   Carotid artery occlusion    Carotid bruit    LICA 47-09% (duplex 6/28)   Degenerative arthritis of spine 2015   Chronic back pain   Hyperlipidemia    Hypertension    Meningioma ([place])    Osteoporosis    Pneumonia May 19, 2014   Stroke [facility])    CVA 2008   Subclavian steal syndrome    Ulcers of both lower extremities ([place]) 2015   Past Surgical History:  Procedure Laterality Date   ABDOMINAL AORTOGRAM W/LOWER EXTREMITY N/A 12/19/2021   Procedure: ABDOMINAL AORTOGRAM W/LOWER EXTREMITY;  Surgeon: Serafina Mitchell, MD;  Location: [place] CV LAB;  Service: Cardiovascular;  Laterality: N/A;   AMPUTATION TOE Left 12/29/2021   Procedure: Incisional DEBRIDEMENT Left Foot with Amputation Left Great Toe;  Surgeon: Felipa Furnace, DPM;  Location: [place];  Service: Podiatry;  Laterality: Left;   CARDIAC CATHETERIZATION     CATARACT EXTRACTION Bilateral 2013   COLONOSCOPY  11/17/2010   Procedure: COLONOSCOPY;  Surgeon: [name], MD;  [facility];  Service: Endoscopy;  Laterality: N/A;  10:45 am   ESOPHAGOGASTRODUODENOSCOPY  11/17/2010   Procedure: ESOPHAGOGASTRODUODENOSCOPY (EGD);  Surgeon: [name], MD;  [facility];  Service: Endoscopy;  Laterality: N/A;   ESOPHAGOGASTRODUODENOSCOPY N/A 02/16/2015   Procedure: ESOPHAGOGASTRODUODENOSCOPY (EGD);  Surgeon: [name], MD;  Location: [place];  Service: Gastroenterology;  Laterality: N/A;   EXPLORATORY LAPAROTOMY  1960s   For peritonitis of undetermined cause   EYE SURGERY     LUMBAR EPIDURAL INJECTION  06-2012--06-2013   pt. states she has had 5 epidurals in 06-2012----06-2013  MASTECTOMY Left 1990   PERIPHERAL VASCULAR INTERVENTION Left 12/19/2021   Procedure: PERIPHERAL VASCULAR INTERVENTION;  Surgeon: Serafina Mitchell, MD;  Location: [place] CV LAB;  Service: Cardiovascular;  Laterality: Left;  SFA and PTA of POP   YAG LASER APPLICATION  Right 7/93/9030   Procedure: YAG LASER APPLICATION;  Surgeon: [name], MD;  Location: [facility];  Service: Ophthalmology;  Laterality: Right;  right   YAG LASER APPLICATION Left 0/92/3300   Procedure: YAG LASER APPLICATION;  Surgeon: [name], MD;  Location: [facility];  Service: Ophthalmology;  Laterality: Left;   Social History:  reports that she quit smoking about 15 years ago. Her smoking use included cigarettes. She has a 56.00 pack-year smoking history. She has never been exposed to tobacco smoke. She has never used smokeless tobacco. She reports that she does not drink alcohol and does not use drugs.  Allergies  Allergen Reactions   Iodine Rash and Other (See Comments)    BETADINE Rash/burning, blisters on skin. Burns skin   Iohexol Rash and Other (See Comments)    Blisters; PT NEEDS 13-HOUR PREP    Sulfa Antibiotics Nausea And Vomiting   Povidone-Iodine Other (See Comments)    Burns skin   Tape Itching, Rash and Other (See Comments)    "DO NOT USE ADHESIVE TAPE" Not even a Band Aid" NO PAPER TAPE Burns skin Skin is very very thin    Family History  Problem Relation Age of Onset   Alzheimer's disease Mother    Dementia Mother    Hypertension Mother    Hyperlipidemia Mother    Parkinsonism Father     Prior to Admission medications   Medication Sig Start Date End Date Taking? Authorizing Provider  acetaminophen (TYLENOL) 325 MG tablet Take 2 tablets (650 mg total) by mouth every 6 (six) hours as needed for mild pain (or Fever >/= 101). 01/02/22  Yes Darrick Meigs, Marge Duncans, MD  ascorbic acid (VITAMIN C) 500 MG tablet Take 500 mg by mouth daily.   Yes [provider]  CALCIUM PO Take 1,200 mg by mouth daily.   Yes [provider]  clopidogrel (PLAVIX) 75 MG tablet TAKE 1 TABLET EVERY MORNING 08/31/21  Yes Josue Hector, MD  collagenase (SANTYL) 250 UNIT/GM ointment Apply 1 Application topically daily. 02/07/22  Yes Felipa Furnace, DPM  denosumab (PROLIA) 60  MG/ML SOSY injection Inject 60 mg into the skin every 6 (six) months.   Yes Celene Squibb, MD  ELIQUIS 2.5 MG TABS tablet TAKE 1 TABLET TWICE A DAY 11/23/21  Yes Josue Hector, MD  Ferrous Sulfate (IRON PO) Take 65 mg by mouth daily at 6 (six) AM.   Yes [provider]  furosemide (LASIX) 20 MG tablet Take 1 tablet (20 mg total) by mouth daily. Only as needed PRN 01/02/22  Yes Lama, Marge Duncans, MD  MAGNESIUM OXIDE PO Take 500 mg by mouth every morning.   Yes [provider]  metoprolol tartrate (LOPRESSOR) 25 MG tablet TAKE 3 TABLETS TWICE A DAY 10/06/21  Yes Josue Hector, MD  Multiple Vitamin (MULTIVITAMIN WITH MINERALS) TABS tablet Take 1 tablet by mouth every morning.   Yes [provider]  oxyCODONE (OXY IR/ROXICODONE) 5 MG immediate release tablet Take 1 tablet (5 mg total) by mouth every 6 (six) hours as needed for moderate pain. 01/02/22  Yes Oswald Hillock, MD  pantoprazole (PROTONIX) 40 MG tablet TAKE 1 TABLET DAILY 08/31/21  Yes Josue Hector,  MD  potassium chloride (KLOR-CON M) 10 MEQ tablet Take 10 mEq by mouth daily.   Yes [provider]  pravastatin (PRAVACHOL) 40 MG tablet Take 1 tablet (40 mg total) by mouth daily. 07/20/21  Yes Josue Hector, MD  Tiotropium Bromide Monohydrate (SPIRIVA RESPIMAT) 2.5 MCG/ACT AERS USE 2 INHALATIONS BY MOUTH  DAILY 10/25/21  Yes Cobb, Karie Schwalbe, NP  amiodarone (PACERONE) 200 MG tablet Take 1 tablet (200 mg total) by mouth daily. Patient not taking: Reported on 03/06/2022 01/16/22   Josue Hector, MD  docusate sodium (COLACE) 100 MG capsule Take 1 capsule (100 mg total) by mouth 2 (two) times daily. Patient not taking: Reported on 03/06/2022 01/02/22   Oswald Hillock, MD  SANTYL 250 UNIT/GM ointment APPLY TO AFFECTED AREA DAILY Patient not taking: Reported on 03/06/2022 01/23/22   Felipa Furnace, DPM    Physical Exam: Vitals:   03/06/22 1139 03/06/22 1200 03/06/22 1225 03/06/22 1717  BP: (!) 94/57 (!) 98/58 (!)  94/57 (!) 110/59  Pulse:  91 98 95  Resp: (!) 21 (!) _0 Temp:   (!) 97.5 F (36.4 C) 98.2 F (36.8 C)  TempSrc:   Oral Oral  SpO2: 100% 96% 96% 95%  Weight:      Height:       General exam: Alert, awake, oriented x 3; underweight and chronically ill in appearance; denies shortness of breath or chest pain.  Having mild tenderness in her left fluid around dorsal aspect wound. Respiratory system: No frank crackles, no wheezing, no using accessory muscles.  Good saturation on room air.  Mild tachypnea at time of admission appreciated (most likely driven for compensation towards lactic acidosis). Cardiovascular system:Rate controlled; no rubs or gallops. Gastrointestinal system: Abdomen is nondistended, soft and nontender. No organomegaly or masses felt. Normal bowel sounds heard. Central nervous system: Alert and oriented. No focal neurological deficits. Extremities: No cyanosis or clubbing; left foot with open wound in the dorsal aspect with frank purulent discharge and positive ulcer.  Patient lower extremity is overall warm to palpation in comparison to her right leg.  No significant erythema outside borders of patient's wound.  Status post left first ray amputation, now healed. Skin: No petechiae. Psychiatry: Judgement and insight appear normal. Mood & affect appropriate.   Data Reviewed: Respiratory panel PCR: Negative for COVID and influenza Lactic acid: 2.5 at time of admission BNP 1274 ESR 52 CRP: 13.3 TSH 5.333 Phosphorus: 2.2 Left foot x-ray: Profound osteopenia, diffuse edema in the forefoot aspect.  There is no presence of destructive bone lesion or convincing soft tissue gas. Chest x-ray: Chronic appearing increased interstitial lung markings with mild stable mid right lung and right basilar linear scarring and or atelectasis.  There is no signs of significant vascular congestion.  Assessment and Plan: * Severe sepsis ([place]) -- Present at time of admission and  presumably secondary to left fluid cellulitis with open wound. -Patient with elevated WBCs, fever/rigors, tachycardia and tachypnea; lactic acid 2.5. -Continue current IV antibiotics -Gentle fluid initiated in the ED; subsequently stop given improvement in patient's lactic acid and presence of significant elevated BNP with mild transient hypoxia. -Chest x-ray not demonstrating acute cardiopulmonary process for infection. -Continue as needed antipyretics and follow clinical response. -Will be important to discuss with Dr. Posey Pronto podiatrist any further recommendations as part of wound care plans.  Pictures in epic media.  GERD (gastroesophageal reflux disease) -Continue PPI.  PAD (peripheral artery disease) ([place]) -Status post left  foot first ray amputation; ongoing non-healing wound in her left dorsal aspect (with purulence discharge currently) -Continue follow-up with Dr. Posey Pronto.  (Podiatrist). -Wound care service consultation for recommendations requested. -Follow clinical response.  Protein-calorie malnutrition, moderate (H[facility]) -Good oral nutrition/Hydration and feeding supplement discussed with patient. -Body mass index is 18.27 kg/m.   Tremor -Continue treatment with mysoline  -Outpatient follow-up with neurology service recommended.  Atrial fibrillation ([place]) -Rate controlled -Recently instructed by cardiology service to hold off on the use of amiodarone. -Continue Eliquis and metoprolol.  Essential hypertension -Stable overall -Continue the use of metoprolol and resume home dose Lasix. -Follow-up vital signs.  Hyperlipemia -Continue statin  Elevated BNP: -Patient with underlying history of chronic diastolic heart failure which appears to be stable and compensated despite these levels. -BNP most likely high in the setting of chronic renal failure.  History of breast cancer -Status post bilateral mastectomy, chemo and radiation -Appears to be currently in  remission -Continue outpatient follow-up screening.  Severe osteopenia/osteoporosis -Continue outpatient treatment with Prolia after discharge.  Chronic diastolic heart failure: -Stable and compensated -Patient denies orthopnea, no shortness of breath. -Chest x-ray not demonstrating vascular congestion. -Continue to follow daily weights and strict intake and output. -Resume home dose of Lasix.    Advance Care Planning:   Code Status: Full Code   Consults: None  Family Communication: Husband at bedside.  Severity of Illness: The appropriate patient status for this patient is INPATIENT. Inpatient status is judged to be reasonable and necessary in order to provide the required intensity of service to ensure the patient's safety. The patient's presenting symptoms, physical exam findings, and initial radiographic and laboratory data in the context of their chronic comorbidities is felt to place them at high risk for further clinical deterioration. Furthermore, it is not anticipated that the patient will be medically stable for discharge from the hospital within 2 midnights of admission.   * I certify that at the point of admission it is my clinical judgment that the patient will require inpatient hospital care spanning beyond 2 midnights from the point of admission due to high intensity of service, high risk for further deterioration and high frequency of surveillance required.*  Author: Barton Dubois, MD 03/06/2022 7:08 PM  For on call review [email or web address].

## 2022-03-06 NOTE — ED Notes (Signed)
Pt resting. Rates pain to left foot 7 but does not want anything for pain. Area is wrapped. No drainage on dressing at this time. A/o. Color wnl. Phone given to pt per request. States she walks at home with a chair walker. Aware awaiting admission. Denies any other needs at this time. Pt is not on 02 at home, 02 down to 1L at this time. Pt denies any sob.

## 2022-03-06 NOTE — Assessment & Plan Note (Signed)
-  Rate controlled -Recently instructed by cardiology service to hold off on the use of amiodarone. -Continue Eliquis and metoprolol.

## 2022-03-06 NOTE — ED Notes (Signed)
ED Provider at bedside. 

## 2022-03-06 NOTE — Assessment & Plan Note (Signed)
-  Good oral nutrition/Hydration and feeding supplement discussed with patient. -Body mass index is 18.27 kg/m.

## 2022-03-06 NOTE — Progress Notes (Signed)
ABG drawn and taken to lab 

## 2022-03-06 NOTE — Assessment & Plan Note (Signed)
--   Present at time of admission and presumably secondary to left fluid cellulitis with open wound. -Patient with elevated WBCs, fever/rigors, tachycardia and tachypnea; lactic acid 2.5. -Continue current IV antibiotics -Gentle fluid initiated in the ED; subsequently stop given improvement in patient's lactic acid and presence of significant elevated BNP with mild transient hypoxia. -Chest x-ray not demonstrating acute cardiopulmonary process for infection. -Continue as needed antipyretics and follow clinical response. -Will be important to discuss with Dr. Posey Pronto podiatrist any further recommendations as part of wound care plans.  Pictures in epic media.

## 2022-03-06 NOTE — Consult Note (Signed)
[facility] Nurse Consult Note: Reason for Consult: left foot wounds Reviewed chart patient followed by podiatry, debridement of wounds 12/29/21 per Dr. Patel/ ABIs improved after revascularization LLE 0.99/RLE 1.08  Wound type: surgical/arterial etiology  Pressure Injury POA: NA Measurement:see nursing flow sheets Wound bed: Dorsal foot: yellow drainage with fibrinous material, + early granulation buds First ray amputation site, healed Left medial malleolar; pink, moist  Drainage (amount, consistency, odor) see above  Periwound: intact  Dressing procedure/placement/frequency: 1. Cleanse left dorsal foot wound with saline, pat dry  2. Apply Santyl to the wound bed  3. Top with dry dressing, ABD or gauze, wrap with kerlix  Change daily   Foam dressing to the left malleolus and left great toe amputation site. Change every 3 days.  Follow up with vascular and podiatry as scheduled outpatient.    Re consult if needed, will not follow at this time. Thanks  Renezmae Canlas [employer], RN,CWOCN, CNS, [place] [phone])

## 2022-03-06 NOTE — ED Notes (Signed)
RN attempted multple [place] IV sites. Unsuccessful at this time. EDP made aware

## 2022-03-06 NOTE — ED Notes (Signed)
Noticed no urine in canister from [facility]. Pt staets she doesn't feel like she needs to urinate and is not wet. Pt is a/o. Pt stated they had to cath her last night. Bladder scanned pt and was 448ml. Cicely RN and Dr Dyann Kief aware

## 2022-03-06 NOTE — ED Notes (Signed)
Pt O2 sat in the 70s. Placed pt on NRB at [facility]. Pt O2 sat at 88-90 %. RT and EDP notified

## 2022-03-07 DIAGNOSIS — L03032 Cellulitis of left toe: Secondary | ICD-10-CM

## 2022-03-07 DIAGNOSIS — A419 Sepsis, unspecified organism: Secondary | ICD-10-CM | POA: Diagnosis not present

## 2022-03-07 DIAGNOSIS — I739 Peripheral vascular disease, unspecified: Secondary | ICD-10-CM | POA: Diagnosis not present

## 2022-03-07 DIAGNOSIS — L02612 Cutaneous abscess of left foot: Secondary | ICD-10-CM | POA: Insufficient documentation

## 2022-03-07 DIAGNOSIS — I480 Paroxysmal atrial fibrillation: Secondary | ICD-10-CM | POA: Diagnosis not present

## 2022-03-07 LAB — URINE CULTURE: Culture: NO GROWTH

## 2022-03-07 LAB — CBC
HCT: 25.8 % — ABNORMAL LOW (ref 36.0–46.0)
HCT: 27.6 % — ABNORMAL LOW (ref 36.0–46.0)
Hemoglobin: 8.4 g/dL — ABNORMAL LOW (ref 12.0–15.0)
Hemoglobin: 8.9 g/dL — ABNORMAL LOW (ref 12.0–15.0)
MCH: 32.3 pg (ref 26.0–34.0)
MCH: 32.2 pg (ref 26.0–34.0)
MCHC: 32.6 g/dL (ref 30.0–36.0)
MCHC: 32.2 g/dL (ref 30.0–36.0)
MCV: 99.2 fL (ref 80.0–100.0)
MCV: 100 fL (ref 80.0–100.0)
Platelets: 237 10*3/uL (ref 150–400)
Platelets: 230 10*3/uL (ref 150–400)
RBC: 2.6 MIL/uL — ABNORMAL LOW (ref 3.87–5.11)
RBC: 2.76 MIL/uL — ABNORMAL LOW (ref 3.87–5.11)
RDW: 15 % (ref 11.5–15.5)
RDW: 15.3 % (ref 11.5–15.5)
WBC: 22.2 10*3/uL — ABNORMAL HIGH (ref 4.0–10.5)
WBC: 22.3 10*3/uL — ABNORMAL HIGH (ref 4.0–10.5)
nRBC: 0 % (ref 0.0–0.2)

## 2022-03-07 LAB — BASIC METABOLIC PANEL
Anion gap: 6 (ref 5–15)
BUN: 24 mg/dL — ABNORMAL HIGH (ref 8–23)
CO2: 23 mmol/L (ref 22–32)
Calcium: 7.7 mg/dL — ABNORMAL LOW (ref 8.9–10.3)
Chloride: 104 mmol/L (ref 98–111)
Creatinine, Ser: 1.03 mg/dL — ABNORMAL HIGH (ref 0.44–1.00)
GFR, Estimated: 54 mL/min — ABNORMAL LOW (ref >60)
Glucose, Bld: 80 mg/dL (ref 70–99)
Potassium: 3.7 mmol/L (ref 3.5–5.1)
Sodium: 133 mmol/L — ABNORMAL LOW (ref 135–145)

## 2022-03-07 LAB — T4, FREE: Free T4: 1.26 ng/dL — ABNORMAL HIGH (ref 0.61–1.12)

## 2022-03-07 MED ORDER — ZINC SULFATE 220 (50 ZN) MG PO CAPS
220.0000 mg | ORAL_CAPSULE | Freq: Every day | ORAL | Status: DC
  Administered 2022-03-07 – 2022-03-09 (×3): 220 mg via ORAL
  Filled 2022-03-07 (×3): qty 1

## 2022-03-07 MED ORDER — ASCORBIC ACID 500 MG/5ML PO LIQD
500.0000 mg | Freq: Every day | ORAL | Status: DC
  Filled 2022-03-07 (×3): qty 5

## 2022-03-07 MED ORDER — PROSOURCE PLUS PO LIQD
30.0000 mL | Freq: Three times a day (TID) | ORAL | Status: DC
  Administered 2022-03-07 – 2022-03-09 (×6): 30 mL via ORAL
  Filled 2022-03-07 (×6): qty 30

## 2022-03-07 MED ORDER — VITAMIN C 500 MG PO TABS
500.0000 mg | ORAL_TABLET | Freq: Every day | ORAL | Status: DC
  Administered 2022-03-07 – 2022-03-09 (×3): 500 mg via ORAL
  Filled 2022-03-07 (×3): qty 1

## 2022-03-07 MED ORDER — JUVEN PO PACK
1.0000 | PACK | Freq: Two times a day (BID) | ORAL | Status: DC
  Administered 2022-03-07 – 2022-03-09 (×4): 1 via ORAL
  Filled 2022-03-07 (×4): qty 1

## 2022-03-07 NOTE — Progress Notes (Signed)
Patient stable and has voided.  Patient did have yellow Mews due to temperature.  Patient medicated and MEWS became green.

## 2022-03-07 NOTE — Progress Notes (Signed)
PROGRESS NOTE  [name] GEZ:[id number] DOB: Aug 04, 1940 DOA: 03/05/2022 PCP: Celene Squibb, MD  Brief History:    81 y.o. female with medical history significant of atrial fibrillation, essential tremors, moderate protein-calorie malnutrition, hypertension, hyperlipidemia, history of stroke, type 2 diabetes mellitus with nephropathy and peripheral arterial disease; who presented to the hospital secondary to febrile illness and worsening tremors for 1 week.   She did not take her temp at home.  She went to see her PCP on 03/05/22 who spoke with [name] and her amiodarone was stopped.  She continued to have what she felt were worsening tremors.  Therefore, she presented to the emergency department for further evaluation and treatment.. In retrospect, the patient now states that she has noted 1 week history of increasing erythema and edema of her left foot.  She is status post left first ray amputation 12/29/2021--Dr. Boneta Lucks. Patient denies fevers, chills, headache, chest pain, dyspnea, nausea, vomiting, diarrhea, abdominal pain, dysuria, hematuria, hematochezia, and melena. In the ED, the patient had a temperature up to 102.0 F with tachycardia and soft blood pressures.  Lactic acid peaked at 2.5.  WBC was 34.5.  The patient was started on vancomycin and cefepime initially.  Assessment/Plan:  Severe sepsis ([place]) -Present at time of admission and presumably secondary to left fluid cellulitis with open wound. -Patient with elevated WBCs, fever/rigors, tachycardia and tachypnea; lactic acid 2.5. -Continue current IV Vanco and ceftriaxone -Gentle fluid initiated in the ED -Personally reviewed chest x-ray--chronic interstitial prominence  Cellulitis left foot -Status post left first ray amputation for osteomyelitis of the great toe -Finish 10 days of Doxy and Augmentin on 01/09/2022 -CRP 13.3 -ESR 52 -01/15/2022 ABIs--normal bilateral -Continue vancomycin and ceftriaxone    GERD (gastroesophageal reflux disease) -Continue PPI.   PAD (peripheral artery disease) ([place]) --01/15/2022 ABIs--normal bilateral -Continue follow-up with Dr. Posey Pronto.  (Podiatrist). -Wound care service consultation for recommendations requested. -Follow clinical response.   Protein-calorie malnutrition, moderate (HCC) -Good oral nutrition/Hydration and feeding supplement discussed with patient. -Body mass index is 18.27 kg/m.   Tremor -Hold mysoline with which pt agrees due to interaction with apixaban -Outpatient follow-up with Dr. Carles Collet   Paroxysmal Atrial fibrillation ([place]) -may need to restart amio due to soft BPs -Continue Eliquis and metoprolol.   Essential hypertension -Stable overall -BPs soft   Hyperlipemia -Continue statin   History of breast cancer -Status post bilateral mastectomy, chemo and radiation -Appears to be currently in remission -Continue outpatient follow-up screening.   Severe osteopenia/osteoporosis -Continue outpatient treatment with Prolia after discharge.   Chronic diastolic heart failure: -Stable and compensated -Patient denies orthopnea, no shortness of breath. -Chest x-ray not demonstrating vascular congestion.          Family Communication:   Family at bedside  Consultants:  none  Code Status:  FULL / DNR  DVT Prophylaxis:  apixaban   Procedures: As Listed in Progress Note Above  Antibiotics: Vanc 11/28>> Ceftriaxone 11/29>          Subjective: She feels that her left foot edema and erythema have improved.  Patient denies fevers, chills, headache, chest pain, dyspnea, nausea, vomiting, diarrhea, abdominal pain, dysuria, hematuria, hematochezia, and melena.   Objective: Vitals:   03/06/22 2055 03/06/22 2237 03/07/22 0038 03/07/22 0322  BP: (!) 103/51 100/60 114/64 105/64  Pulse: 99 (!) 109 (!) 113 (!) 106  Resp: _0 Temp: (!) 102 F (38.9 C) (!)  101.2 F (38.4 C) 99.9 F (37.7 C) 98.2 F (36.8  C)  TempSrc:      SpO2: 93% 92% 90% 92%  Weight:    48.6 kg  Height:        Intake/Output Summary (Last 24 hours) at 03/07/2022 2831 Last data filed at 03/06/2022 1730 Gross per 24 hour  Intake 480 ml  Output 275 ml  Net 205 ml   Weight change: 3.3 kg Exam:  General:  Pt is alert, follows commands appropriately, not in acute distress HEENT: No icterus, No thrush, No neck mass, [place]/AT Cardiovascular: IRRR, S1/S2, no rubs, no gallops Respiratory: Fine bibasilar crackles but no wheezing. Abdomen: Soft/+BS, non tender, non distended, no guarding Extremities: 1+ LLE edema, No lymphangitis, No petechiae, No rashes, no synovitis      Data Reviewed: I have personally reviewed following labs and imaging studies Basic Metabolic Panel: Recent Labs  Lab 03/05/22 2318 03/06/22 1004 03/07/22 0530  NA 136  --  133*  K 4.4  --  3.7  CL 101  --  104  CO2 24  --  23  GLUCOSE 95  --  80  BUN 24*  --  24*  CREATININE 1.15*  --  1.03*  CALCIUM 9.1  --  7.7*  MG  --  2.2  --   PHOS  --  2.2*  --    Liver Function Tests: Recent Labs  Lab 03/05/22 2318  AST 20  ALT 12  ALKPHOS 56  BILITOT 0.7  PROT 7.3  ALBUMIN 3.4*   No results for input(s): "LIPASE", "AMYLASE" in the last 168 hours. No results for input(s): "AMMONIA" in the last 168 hours. Coagulation Profile: No results for input(s): "INR", "PROTIME" in the last 168 hours. CBC: Recent Labs  Lab 03/05/22 2318 03/07/22 0530  WBC 34.5* 22.2*  NEUTROABS 32.6*  --   HGB 11.7* 8.4*  HCT 36.1 25.8*  MCV 100.3* 99.2  PLT 327 237   Cardiac Enzymes: No results for input(s): "CKTOTAL", "CKMB", "CKMBINDEX", "TROPONINI" in the last 168 hours. BNP: Invalid input(s): "POCBNP" CBG: Recent Labs  Lab 03/05/22 1955  GLUCAP 122*   HbA1C: Recent Labs    03/06/22 1004  HGBA1C 5.2   Urine analysis:    Component Value Date/Time   COLORURINE AMBER (A) 03/06/2022 0520   APPEARANCEUR CLEAR 03/06/2022 0520   LABSPEC  1.025 03/06/2022 0520   PHURINE 6.0 03/06/2022 0520   GLUCOSEU NEGATIVE 03/06/2022 0520   HGBUR NEGATIVE 03/06/2022 0520   BILIRUBINUR NEGATIVE 03/06/2022 0520   KETONESUR 5 (A) 03/06/2022 0520   PROTEINUR 100 (A) 03/06/2022 0520   UROBILINOGEN 0.2 02/15/2015 0417   NITRITE NEGATIVE 03/06/2022 0520   LEUKOCYTESUR NEGATIVE 03/06/2022 0520   Sepsis Labs: _0 (procalcitonin:4,lacticidven:4) ) Recent Results (from the past 240 hour(s))  Culture, blood (Routine X 2) w Reflex to ID Panel     Status: None (Preliminary result)   Collection Time: 03/06/22  2:41 AM   Specimen: BLOOD  Result Value Ref Range Status   Specimen Description BLOOD BLOOD RIGHT ARM  Final   Special Requests   Final    BOTTLES DRAWN AEROBIC AND ANAEROBIC Blood Culture adequate volume Performed at [facility], [address], [place], [place] [address]    Culture PENDING  Incomplete   Report Status PENDING  Incomplete  Culture, blood (Routine X 2) w Reflex to ID Panel     Status: None (Preliminary result)   Collection Time: 03/06/22  2:43 AM  Specimen: BLOOD  Result Value Ref Range Status   Specimen Description BLOOD BLOOD LEFT HAND  Final   Special Requests   Final    BOTTLES DRAWN AEROBIC ONLY Blood Culture results may not be optimal due to an inadequate volume of blood received in culture bottles Performed at [facility], [address], [place], [place] [address]    Culture PENDING  Incomplete   Report Status PENDING  Incomplete  Resp Panel by RT-PCR (Flu A&B, Covid) Anterior Nasal Swab     Status: None   Collection Time: 03/06/22  3:15 AM   Specimen: Anterior Nasal Swab  Result Value Ref Range Status   SARS Coronavirus 2 by RT PCR NEGATIVE NEGATIVE Final    Comment: (NOTE) SARS-CoV-2 target nucleic acids are NOT DETECTED.  The SARS-CoV-2 RNA is generally detectable in upper respiratory specimens during the acute phase of infection. The lowest concentration of SARS-CoV-2 viral copies this assay  can detect is 138 copies/mL. A negative result does not preclude SARS-Cov-2 infection and should not be used as the sole basis for treatment or other patient management decisions. A negative result may occur with  improper specimen collection/handling, submission of specimen other than nasopharyngeal swab, presence of viral mutation(s) within the areas targeted by this assay, and inadequate number of viral copies(<138 copies/mL). A negative result must be combined with clinical observations, patient history, and epidemiological information. The expected result is Negative.  Fact Sheet for Patients:  EntrepreneurPulse.com.au  Fact Sheet for Healthcare Providers:  IncredibleEmployment.be  This test is no t yet approved or cleared by the [place] FDA and  has been authorized for detection and/or diagnosis of SARS-CoV-2 by FDA under an Emergency Use Authorization (EUA). This EUA will remain  in effect (meaning this test can be used) for the duration of the COVID-19 declaration under Section 564(b)(1) of the Act, 21 U.S.C.section 360bbb-3(b)(1), unless the authorization is terminated  or revoked sooner.       Influenza A by PCR NEGATIVE NEGATIVE Final   Influenza B by PCR NEGATIVE NEGATIVE Final    Comment: (NOTE) The Xpert Xpress SARS-CoV-2/FLU/RSV plus assay is intended as an aid in the diagnosis of influenza from Nasopharyngeal swab specimens and should not be used as a sole basis for treatment. Nasal washings and aspirates are unacceptable for Xpert Xpress SARS-CoV-2/FLU/RSV testing.  Fact Sheet for Patients: EntrepreneurPulse.com.au  Fact Sheet for Healthcare Providers: IncredibleEmployment.be  This test is not yet approved or cleared by the [place] FDA and has been authorized for detection and/or diagnosis of SARS-CoV-2 by FDA under an Emergency Use Authorization (EUA). This EUA will  remain in effect (meaning this test can be used) for the duration of the COVID-19 declaration under Section 564(b)(1) of the Act, 21 U.S.C. section 360bbb-3(b)(1), unless the authorization is terminated or revoked.  Performed at [facility], [address], [place], [place] [address]      Scheduled Meds:  apixaban  2.5 mg Oral BID   clopidogrel  75 mg Oral q morning   collagenase   Topical Daily   furosemide  20 mg Oral Daily   metoprolol tartrate  75 mg Oral BID   multivitamin with minerals  1 tablet Oral q morning   pantoprazole  40 mg Oral Daily   potassium chloride  10 mEq Oral Daily   pravastatin  40 mg Oral Daily   primidone  12.5 mg Oral QHS   umeclidinium bromide  1 puff Inhalation Daily   Continuous Infusions:  cefTRIAXone (ROCEPHIN)  IV 2 g (03/07/22 0413)   [START ON 03/08/2022] vancomycin      Procedures/Studies: DG Foot 2 Views Left  Result Date: 03/06/2022 CLINICAL DATA:  [id number]. Recent left toe amputation. Concern for infection. EXAM: LEFT FOOT - 2 VIEW COMPARISON:  Preoperative left foot series 12/25/2021 FINDINGS: Profound osteopenia is again noted. There is diffuse edema in the foot greatest in the forefoot. There is artifact from overlying clothing or dressing wrap material. There has been interval first ray amputation from the mid first metatarsal shaft forward. No destructive bone lesion is seen definitive for acute osteomyelitis. Joint spaces are maintained with normal interosseous alignment. No radiopaque foreign body or convincing soft tissue gas identified allowing for overlying artifact. There are vascular calcifications in the distal foreleg and foot. IMPRESSION: 1. Profound osteopenia. 2. Diffuse edema in the foot greatest in the forefoot. 3. Interval first ray amputation. 4. No destructive bone lesion or convincing soft tissue gas identified allowing for overlying artifact. Electronically Signed   By: Telford Nab M.D.   On: 03/06/2022 05:19   DG Chest  Port 1 View  Result Date: 03/06/2022 CLINICAL DATA:  81 year old female with history of worsening respiratory status. Fever and shortness of breath. EXAM: PORTABLE CHEST 1 VIEW COMPARISON:  Chest x-ray 03/06/2022. FINDINGS: Lung volumes are normal. No confluent consolidative airspace disease. No pleural effusions. No pneumothorax. Scattered areas of interstitial prominence and extensive architectural distortion are again noted in the lungs, most evident in the right mid to lower lung, similar to numerous prior examinations, most compatible with areas of chronic post infectious or inflammatory scarring. No definite suspicious appearing pulmonary nodules or masses are noted. No evidence of pulmonary edema. Heart size is normal. Atherosclerotic calcifications are noted within the thoracic aorta. IMPRESSION: 1. No definite radiographic evidence of acute cardiopulmonary disease. 2. Chronic interstitial prominence in the lungs and scattered areas of scarring, most evident throughout the right lung, similar to numerous prior examinations. 3. Aortic atherosclerosis. Electronically Signed   By: Vinnie Langton M.D.   On: 03/06/2022 05:14   DG Chest Port 1 View  Result Date: 03/06/2022 CLINICAL DATA:  Fevers and tremor. EXAM: PORTABLE CHEST 1 VIEW COMPARISON:  December 30, 2021 FINDINGS: The heart size and mediastinal contours are within normal limits. Radiopaque surgical clips are seen overlying the lateral aspect of the upper left lung. Mild to moderate severity diffuse, chronic appearing increased interstitial lung markings are seen. Mild, stable areas of linear scarring and/or atelectasis are seen within the mid right lung and right lung base. There is no evidence of a pleural effusion or pneumothorax. There is moderate severity levoscoliosis of the lower thoracic and upper lumbar spine. Multilevel degenerative changes are also seen throughout the thoracolumbar spine. IMPRESSION: Chronic appearing increased  interstitial lung markings with mild, stable mid right lung and right basilar linear scarring and/or atelectasis. Electronically Signed   By: Virgina Norfolk M.D.   On: 03/06/2022 03:18    Orson Eva, DO  [facility]  If 7PM-7AM, please contact night-coverage [email or web address] Password [email or web address] 03/07/2022, 7:38 AM   LOS: 1 day

## 2022-03-07 NOTE — TOC Initial Note (Signed)
Transition of Care [facility]) - Initial/Assessment Note    Patient Details  Name: [name] MRN: [id number] Date of Birth: [birth year]  Transition [facility]) CM/SW Contact:    Salome Arnt, [place] Phone Number: 03/07/2022, 8:11 AM  Clinical Narrative: Pt admitted for severe sepsis. Assessment completed due to high risk readmission score and TOC consult for home health/DME/medication assistance. Pt's husband reports pt requires some assistance at home, but had been improving with home health services. She is active with [facility] for HHPT and RN. [name] with [facility] aware of admission. Pt ambulates with walker at baseline. Pt's husband plans on pt returning home when medically stable. He indicates pt's medications can be a little expensive sometimes, but they have been able to afford everything so far. TOC will continue to follow.                  Expected Discharge Plan: [place] Barriers to Discharge: Continued Medical Work up   Patient Goals and CMS Choice Patient states their goals for this hospitalization and ongoing recovery are:: return home   Choice offered to / list presented to : Spouse  Expected Discharge Plan and Services Expected Discharge Plan: [place] In-house Referral: Clinical Social Work   Post Acute Care Choice: Resumption of Svcs/PTA Provider Living arrangements for the past 2 months: Single Family Home                           HH Arranged: PT, RN   Date HH Agency Contacted: 03/07/22 Time [place]: [phone] Representative spoke with at [place]: Billey Chang  Prior Living Arrangements/Services Living arrangements for the past 2 months: [place] Lives with:: Spouse Patient language and need for interpreter reviewed:: Yes Do you feel safe going back to the place where you live?: Yes      Need for Family Participation in Patient Care: Yes (Comment) Care giver support system in place?:  Yes (comment) Current home services: DME, Home PT, Home RN Criminal Activity/Legal Involvement Pertinent to Current Situation/Hospitalization: No - Comment as needed  Activities of Daily Living Home Assistive Devices/Equipment: Environmental consultant (specify type), Shower chair with back, Cane (specify quad or straight), Eyeglasses ADL Screening (condition at time of admission) Patient's cognitive ability adequate to safely complete daily activities?: Yes Is the patient deaf or have difficulty hearing?: No Does the patient have difficulty seeing, even when wearing glasses/contacts?: No Does the patient have difficulty concentrating, remembering, or making decisions?: No Patient able to express need for assistance with ADLs?: Yes Does the patient have difficulty dressing or bathing?: No Independently performs ADLs?: Yes (appropriate for developmental age) Does the patient have difficulty walking or climbing stairs?: Yes Weakness of Legs: Both Weakness of Arms/Hands: None  Permission Sought/Granted                  Emotional Assessment         Alcohol / Substance Use: Not Applicable Psych Involvement: No (comment)  Admission diagnosis:  SIRS (systemic inflammatory response syndrome) ([place]) [R65.10] Patient Active Problem List   Diagnosis Date Noted   Cellulitis and abscess of toe of left foot 03/07/2022   GERD (gastroesophageal reflux disease) 03/06/2022   Atrial flutter with rapid ventricular response ([place])    Severe sepsis ([place]) 12/25/2021   Diarrhea 12/25/2021   Hyperkalemia 12/25/2021   Osteomyelitis of great toe of left foot ([place]) 12/25/2021  PAD (peripheral artery disease) ([place]) 12/19/2021   Osteoporosis    Tibial plateau fracture, left approx. 05/10/17 06/27/2017   COPD, moderate ([place]) 07/12/2015   Multiple gastric ulcers    Coagulopathy (HCC)    Bleeding gastrointestinal    Acute GI bleeding    CAD in native artery    Supratherapeutic INR    Right lower lobe lung mass     Cavitary lesion of lung    AKI (acute kidney injury) ([place])    Chronic back pain    GI bleed 02/14/2015   Carotid artery disease ([place]) 02/14/2015   Leukocytosis 02/14/2015   Lung nodules 09/29/2014   Smoking history 09/29/2014   Pneumonia with cavity of lung 07/09/2014   Lung mass 07/09/2014   Thrombocytosis ([place]) 05/21/2014   Protein-calorie malnutrition, moderate (HCC) 05/21/2014   Anemia    Generalized weakness    Necrotic pneumonia ([place]) 05/20/2014   Pneumonia 05/20/2014   Elevated liver enzymes 12/16/2013   Tremor 06/24/2013   Encounter for therapeutic drug monitoring 05/18/2013   Dizziness 01/12/2013   Wound of ankle 10/23/2010   Long term current use of anticoagulant 07/05/2010   Carotid bruit 11/01/2009   Hyperlipemia 10/26/2009   Essential hypertension 10/26/2009   Atrial fibrillation ([place]) 10/26/2009   ACUTE ON CHRONIC DIASTOLIC HEART FAILURE 21/06/1279   SYNCOPE AND COLLAPSE 10/26/2009   PCP:  Celene Squibb, MD Pharmacy:   [facility], [place] [address] [place] [address] Phone: [phone] Fax: [phone]  [facility] [place] [facility] [address] [place] [address] Phone: [phone] Fax: [phone]     Social Determinants of Health (SDOH) Interventions    Readmission Risk Interventions    03/07/2022    8:09 AM  Readmission Risk Prevention Plan  Transportation Screening Complete  HRI or Home Care Consult Complete  Social Work Consult for [place] Planning/Counseling Complete  Palliative Care Screening Not Applicable  Medication Review Press photographer) Complete

## 2022-03-07 NOTE — Progress Notes (Signed)
Initial Nutrition Assessment  DOCUMENTATION CODES:      INTERVENTION:  Magic cup daily with dinner  JUVEN BID  Vitamin C 500 mg daily x 30 days  Zinc 220 mg x 14 days  NUTRITION DIAGNOSIS:   Increased nutrient needs related to wound healing as evidenced by estimated needs.   GOAL:  Patient will meet greater than or equal to 90% of their needs   MONITOR:  PO intake, Supplement acceptance, Labs, Skin, Weight trends  REASON FOR ASSESSMENT:   Consult Wound healing  ASSESSMENT: Patient is an 81 yo female from home with hx of DM2, Stroke, GERD, CHF, tremors and moderate PCM. Presents with severe sepsis secondary to cellulitis left foot.  No family bedside- she lives with her husband. Patient ate 50% of dinner last night and 25-50% of breakfast and lunch today. She ate 100% of her chicken and mashed potatoes at lunch and says appetite is getting better. Feeding herself.   Patient says for ~ 2 months she was taking a "sulfa" drug which was making her feel nauseated. During that time her nutrition intake was diminished. The past 5 weeks she has been at home and trying to focus on eating as well as possible. She likes fruits and vegetables and is a meat eater. Patient doesn't drink ONS but takes MVI, Vit D, Vit C, Iron  daily.   Weights reviewed. Patient moderately malnourished. Educated regarding importance of nutrition to support wound healing and increased nutrition needs during acute inflammation.     Medications: lasix, MVI, Potassium chloride, lopressor. IV -antibiotics     Latest Ref Rng & Units 03/07/2022    5:30 AM 03/05/2022   11:18 PM 01/02/2022    1:48 AM  BMP  Glucose 70 - 99 mg/dL 80  95  100   BUN 8 - 23 mg/dL 24  24  25    Creatinine 0.44 - 1.00 mg/dL 1.03  1.15  0.74   Sodium 135 - 145 mmol/L 133  136  138   Potassium 3.5 - 5.1 mmol/L 3.7  4.4  3.4   Chloride 98 - 111 mmol/L 104  101  110   CO2 22 - 32 mmol/L 23  24  22    Calcium 8.9 - 10.3 mg/dL 7.7  9.1   8.1       NUTRITION - FOCUSED PHYSICAL EXAM:  Flowsheet Row Most Recent Value  Orbital Region Mild depletion  Upper Arm Region Moderate depletion  Thoracic and Lumbar Region Mild depletion  Temple Region No depletion  Clavicle Bone Region Mild depletion  Clavicle and Acromion Bone Region Moderate depletion  Scapular Bone Region Mild depletion  Dorsal Hand Severe depletion  Patellar Region Moderate depletion  Anterior Thigh Region Mild depletion  Hair Reviewed  Eyes Reviewed  Mouth Reviewed  Skin Reviewed  Nails Reviewed       Diet Order:   Diet Order             Diet Heart Room service appropriate? Yes; Fluid consistency: Thin  Diet effective now                   EDUCATION NEEDS:  Education needs have been addressed  Skin:  Skin Assessment: Skin Integrity Issues: Skin Integrity Issues::  (2 open areas on left foot) first ray amputation (sept '23) -site healed, dorsal drainage  Last BM:  11/28  Height:   Ht Readings from Last 1 Encounters:  03/05/22 5\' 2"  (1.575 m)    Weight:  Wt Readings from Last 1 Encounters:  03/07/22 48.6 kg    Ideal Body Weight:   50 kg  BMI:  Body mass index is 19.6 kg/m.  Estimated Nutritional Needs:   Kcal:  1600-1750  Protein:  70-78 gr  Fluid:  >1500 ml daily   Colman Cater MS,RD,CSG,LDN Contact: Shea Evans

## 2022-03-08 DIAGNOSIS — L03032 Cellulitis of left toe: Secondary | ICD-10-CM | POA: Diagnosis not present

## 2022-03-08 DIAGNOSIS — I739 Peripheral vascular disease, unspecified: Secondary | ICD-10-CM | POA: Diagnosis not present

## 2022-03-08 DIAGNOSIS — A419 Sepsis, unspecified organism: Secondary | ICD-10-CM | POA: Diagnosis not present

## 2022-03-08 DIAGNOSIS — R6520 Severe sepsis without septic shock: Secondary | ICD-10-CM | POA: Diagnosis not present

## 2022-03-08 LAB — BASIC METABOLIC PANEL
Anion gap: 9 (ref 5–15)
BUN: 38 mg/dL — ABNORMAL HIGH (ref 8–23)
CO2: 21 mmol/L — ABNORMAL LOW (ref 22–32)
Calcium: 7.9 mg/dL — ABNORMAL LOW (ref 8.9–10.3)
Chloride: 104 mmol/L (ref 98–111)
Creatinine, Ser: 1.01 mg/dL — ABNORMAL HIGH (ref 0.44–1.00)
GFR, Estimated: 55 mL/min — ABNORMAL LOW (ref >60)
Glucose, Bld: 94 mg/dL (ref 70–99)
Potassium: 4.1 mmol/L (ref 3.5–5.1)
Sodium: 134 mmol/L — ABNORMAL LOW (ref 135–145)

## 2022-03-08 LAB — CBC
HCT: 30.7 % — ABNORMAL LOW (ref 36.0–46.0)
Hemoglobin: 9.5 g/dL — ABNORMAL LOW (ref 12.0–15.0)
MCH: 32.3 pg (ref 26.0–34.0)
MCHC: 30.9 g/dL (ref 30.0–36.0)
MCV: 104.4 fL — ABNORMAL HIGH (ref 80.0–100.0)
Platelets: 262 10*3/uL (ref 150–400)
RBC: 2.94 MIL/uL — ABNORMAL LOW (ref 3.87–5.11)
RDW: 15.2 % (ref 11.5–15.5)
WBC: 15.3 10*3/uL — ABNORMAL HIGH (ref 4.0–10.5)
nRBC: 0 % (ref 0.0–0.2)

## 2022-03-08 NOTE — Progress Notes (Signed)
PROGRESS NOTE  [name] GQQ:[id number] DOB: 17-Apr-1940 DOA: 03/05/2022 PCP: Celene Squibb, MD  Brief History:     81 y.o. female with medical history significant of atrial fibrillation, essential tremors, moderate protein-calorie malnutrition, hypertension, hyperlipidemia, history of stroke, type 2 diabetes mellitus with nephropathy and peripheral arterial disease; who presented to the hospital secondary to febrile illness and worsening tremors for 1 week.   She did not take her temp at home.  She went to see her PCP on 03/05/22 who spoke with [name] and her amiodarone was stopped.  She continued to have what she felt were worsening tremors.  Therefore, she presented to the emergency department for further evaluation and treatment.. In retrospect, the patient now states that she has noted 1 week history of increasing erythema and edema of her left foot.  She is status post left first ray amputation 12/29/2021--Dr. Boneta Lucks. Patient denies fevers, chills, headache, chest pain, dyspnea, nausea, vomiting, diarrhea, abdominal pain, dysuria, hematuria, hematochezia, and melena. In the ED, the patient had a temperature up to 102.0 F with tachycardia and soft blood pressures.  Lactic acid peaked at 2.5.  WBC was 34.5.  The patient was started on vancomycin and cefepime initially.   Assessment/Plan:  Severe sepsis ([place]) -Present at time of admission and presumably secondary to left fluid cellulitis with open wound. -Patient with elevated WBCs, fever/rigors, tachycardia and tachypnea; lactic acid 2.5. -Continue current IV Vanco and ceftriaxone -Gentle fluid initiated in the ED -Personally reviewed chest x-ray--chronic interstitial prominence   Cellulitis left foot -Status post left first ray amputation for osteomyelitis of the great toe -Finish 10 days of Doxy and Augmentin on 01/09/2022 -CRP 13.3 -ESR 52 -01/15/2022 ABIs--normal bilateral -Continue vancomycin and  ceftriaxone -WBC 34.5>>22.3>>15.3 -clinically edema, pain and erythema are improving   GERD (gastroesophageal reflux disease) -Continue PPI.   PAD (peripheral artery disease) ([place]) --01/15/2022 ABIs--normal bilateral -Continue follow-up with [facility] -Wound care service consultation for recommendations appreciated -Follow clinical response.   Protein-calorie malnutrition, moderate ([facility]) -Good oral nutrition/Hydration and feeding supplement discussed with patient. -Body mass index is 18.27 kg/m.   Tremor -Hold mysoline with which pt agrees due to interaction with apixaban -Outpatient follow-up with Dr. Carles Collet   Paroxysmal Atrial fibrillation ([place]) -may need to restart amio due to soft BPs -Continue Eliquis and metoprolol. -rate controlled   Essential hypertension -Stable overall -BPs soft   Hyperlipemia -Continue statin   History of breast cancer -Status post bilateral mastectomy, chemo and radiation -Appears to be currently in remission -Continue outpatient follow-up screening.   Severe osteopenia/osteoporosis -Continue outpatient treatment with Prolia after discharge.   Chronic diastolic heart failure: -Stable and compensated -Patient denies orthopnea, no shortness of breath. -Chest x-ray not demonstrating vascular congestion.               Family Communication:   spouse updated at bedside 11/30   Consultants:  none   Code Status:  FULL    DVT Prophylaxis:  apixaban     Procedures: As Listed in Progress Note Above   Antibiotics: Vanc 11/28>> Ceftriaxone 11/29>          Subjective: Patient complains of some pain on left heel.  Overall feels pain on left foot and leg are improving.  Denies f/c, cp, sob, n/v/d  Objective: Vitals:   03/07/22 2211 03/07/22 2238 03/08/22 0353 03/08/22 0824  BP: 108/65 (!) 107/52 (!) 143/87   Pulse: 75 100 Marland Kitchen)  104   Resp:  18 20   Temp:  99.5 F (37.5 C) 99.5 F (37.5 C)   TempSrc:  Oral    SpO2:  90% 92% 92%   Weight:   48.9 kg   Height:        Intake/Output Summary (Last 24 hours) at 03/08/2022 1328 Last data filed at 03/08/2022 0900 Gross per 24 hour  Intake 700 ml  Output 800 ml  Net -100 ml   Weight change: 0.3 kg Exam:  General:  Pt is alert, follows commands appropriately, not in acute distress HEENT: No icterus, No thrush, No neck mass, /AT Cardiovascular: RRR, S1/S2, no rubs, no gallops Respiratory: CTA bilaterally, no wheezing, no crackles, no rhonchi Abdomen: Soft/+BS, non tender, non distended, no guarding Extremities: see pics of left leg/foot today       Data Reviewed: I have personally reviewed following labs and imaging studies Basic Metabolic Panel: Recent Labs  Lab 03/05/22 2318 03/06/22 1004 03/07/22 0530 03/08/22 0444  NA 136  --  133* 134*  K 4.4  --  3.7 4.1  CL 101  --  104 104  CO2 24  --  23 21*  GLUCOSE 95  --  80 94  BUN 24*  --  24* 38*  CREATININE 1.15*  --  1.03* 1.01*  CALCIUM 9.1  --  7.7* 7.9*  MG  --  2.2  --   --   PHOS  --  2.2*  --   --    Liver Function Tests: Recent Labs  Lab 03/05/22 2318  AST 20  ALT 12  ALKPHOS 56  BILITOT 0.7  PROT 7.3  ALBUMIN 3.4*   No results for input(s): "LIPASE", "AMYLASE" in the last 168 hours. No results for input(s): "AMMONIA" in the last 168 hours. Coagulation Profile: No results for input(s): "INR", "PROTIME" in the last 168 hours. CBC: Recent Labs  Lab 03/05/22 2318 03/07/22 0530 03/07/22 0849 03/08/22 0444  WBC 34.5* 22.2* 22.3* 15.3*  NEUTROABS 32.6*  --   --   --   HGB 11.7* 8.4* 8.9* 9.5*  HCT 36.1 25.8* 27.6* 30.7*  MCV 100.3* 99.2 100.0 104.4*  PLT 327 237 230 262   Cardiac Enzymes: No results for input(s): "CKTOTAL", "CKMB", "CKMBINDEX", "TROPONINI" in the last 168 hours. BNP: Invalid input(s): "POCBNP" CBG: Recent Labs  Lab 03/05/22 1955  GLUCAP 122*   HbA1C: Recent Labs    03/06/22 1004  HGBA1C 5.2   Urine analysis:    Component Value Date/Time    COLORURINE AMBER (A) 03/06/2022 0520   APPEARANCEUR CLEAR 03/06/2022 0520   LABSPEC 1.025 03/06/2022 0520   PHURINE 6.0 03/06/2022 0520   GLUCOSEU NEGATIVE 03/06/2022 0520   HGBUR NEGATIVE 03/06/2022 0520   BILIRUBINUR NEGATIVE 03/06/2022 0520   KETONESUR 5 (A) 03/06/2022 0520   PROTEINUR 100 (A) 03/06/2022 0520   UROBILINOGEN 0.2 02/15/2015 0417   NITRITE NEGATIVE 03/06/2022 0520   LEUKOCYTESUR NEGATIVE 03/06/2022 0520   Sepsis Labs: _0 (procalcitonin:4,lacticidven:4) ) Recent Results (from the past 240 hour(s))  Culture, blood (Routine X 2) w Reflex to ID Panel     Status: None (Preliminary result)   Collection Time: 03/06/22  2:41 AM   Specimen: BLOOD  Result Value Ref Range Status   Specimen Description BLOOD BLOOD RIGHT ARM  Final   Special Requests   Final    BOTTLES DRAWN AEROBIC AND ANAEROBIC Blood Culture adequate volume   Culture   Final    NO GROWTH 1  DAY Performed at [facility], [address], [place], [place] [address]    Report Status PENDING  Incomplete  Culture, blood (Routine X 2) w Reflex to ID Panel     Status: None (Preliminary result)   Collection Time: 03/06/22  2:43 AM   Specimen: BLOOD  Result Value Ref Range Status   Specimen Description BLOOD BLOOD LEFT HAND  Final   Special Requests   Final    BOTTLES DRAWN AEROBIC ONLY Blood Culture results may not be optimal due to an inadequate volume of blood received in culture bottles   Culture   Final    NO GROWTH 1 DAY Performed at [facility], [address], [place], [place] [address]    Report Status PENDING  Incomplete  Resp Panel by RT-PCR (Flu A&B, Covid) Anterior Nasal Swab     Status: None   Collection Time: 03/06/22  3:15 AM   Specimen: Anterior Nasal Swab  Result Value Ref Range Status   SARS Coronavirus 2 by RT PCR NEGATIVE NEGATIVE Final    Comment: (NOTE) SARS-CoV-2 target nucleic acids are NOT DETECTED.  The SARS-CoV-2 RNA is generally detectable in upper respiratory specimens  during the acute phase of infection. The lowest concentration of SARS-CoV-2 viral copies this assay can detect is 138 copies/mL. A negative result does not preclude SARS-Cov-2 infection and should not be used as the sole basis for treatment or other patient management decisions. A negative result may occur with  improper specimen collection/handling, submission of specimen other than nasopharyngeal swab, presence of viral mutation(s) within the areas targeted by this assay, and inadequate number of viral copies(<138 copies/mL). A negative result must be combined with clinical observations, patient history, and epidemiological information. The expected result is Negative.  Fact Sheet for Patients:  EntrepreneurPulse.com.au  Fact Sheet for Healthcare Providers:  IncredibleEmployment.be  This test is no t yet approved or cleared by the [place] FDA and  has been authorized for detection and/or diagnosis of SARS-CoV-2 by FDA under an Emergency Use Authorization (EUA). This EUA will remain  in effect (meaning this test can be used) for the duration of the COVID-19 declaration under Section 564(b)(1) of the Act, 21 U.S.C.section 360bbb-3(b)(1), unless the authorization is terminated  or revoked sooner.       Influenza A by PCR NEGATIVE NEGATIVE Final   Influenza B by PCR NEGATIVE NEGATIVE Final    Comment: (NOTE) The Xpert Xpress SARS-CoV-2/FLU/RSV plus assay is intended as an aid in the diagnosis of influenza from Nasopharyngeal swab specimens and should not be used as a sole basis for treatment. Nasal washings and aspirates are unacceptable for Xpert Xpress SARS-CoV-2/FLU/RSV testing.  Fact Sheet for Patients: EntrepreneurPulse.com.au  Fact Sheet for Healthcare Providers: IncredibleEmployment.be  This test is not yet approved or cleared by the [place] FDA and has been authorized for detection  and/or diagnosis of SARS-CoV-2 by FDA under an Emergency Use Authorization (EUA). This EUA will remain in effect (meaning this test can be used) for the duration of the COVID-19 declaration under Section 564(b)(1) of the Act, 21 U.S.C. section 360bbb-3(b)(1), unless the authorization is terminated or revoked.  Performed at [facility], [address], [place], [place] [address]   Urine Culture     Status: None   Collection Time: 03/06/22  5:20 AM   Specimen: Urine, Catheterized  Result Value Ref Range Status   Specimen Description   Final    URINE, CATHETERIZED Performed at [facility], [address],  [place], [place] [address]    Special Requests   Final    NONE Performed at [facility], [address], [place], [place] [address]    Culture   Final    NO GROWTH Performed at [facility], [place] [address] [place], [place] [address]    Report Status 03/07/2022 FINAL  Final     Scheduled Meds:  (feeding supplement) PROSource Plus  30 mL Oral TID BM   apixaban  2.5 mg Oral BID   ascorbic acid  500 mg Oral Daily   clopidogrel  75 mg Oral q morning   collagenase   Topical Daily   furosemide  20 mg Oral Daily   metoprolol tartrate  75 mg Oral BID   multivitamin with minerals  1 tablet Oral q morning   nutrition supplement (JUVEN)  1 packet Oral BID BM   pantoprazole  40 mg Oral Daily   potassium chloride  10 mEq Oral Daily   pravastatin  40 mg Oral Daily   umeclidinium bromide  1 puff Inhalation Daily   zinc sulfate  220 mg Oral Daily   Continuous Infusions:  cefTRIAXone (ROCEPHIN)  IV Stopped (03/08/22 0422)   vancomycin 750 mg (03/08/22 0550)    Procedures/Studies: DG Foot 2 Views Left  Result Date: 03/06/2022 CLINICAL DATA:  [id number]. Recent left toe amputation. Concern for infection. EXAM: LEFT FOOT - 2 VIEW COMPARISON:  Preoperative left foot series 12/25/2021 FINDINGS: Profound osteopenia is again noted. There is diffuse edema in the foot greatest in the  forefoot. There is artifact from overlying clothing or dressing wrap material. There has been interval first ray amputation from the mid first metatarsal shaft forward. No destructive bone lesion is seen definitive for acute osteomyelitis. Joint spaces are maintained with normal interosseous alignment. No radiopaque foreign body or convincing soft tissue gas identified allowing for overlying artifact. There are vascular calcifications in the distal foreleg and foot. IMPRESSION: 1. Profound osteopenia. 2. Diffuse edema in the foot greatest in the forefoot. 3. Interval first ray amputation. 4. No destructive bone lesion or convincing soft tissue gas identified allowing for overlying artifact. Electronically Signed   By: Telford Nab M.D.   On: 03/06/2022 05:19   DG Chest Port 1 View  Result Date: 03/06/2022 CLINICAL DATA:  81 year old female with history of worsening respiratory status. Fever and shortness of breath. EXAM: PORTABLE CHEST 1 VIEW COMPARISON:  Chest x-ray 03/06/2022. FINDINGS: Lung volumes are normal. No confluent consolidative airspace disease. No pleural effusions. No pneumothorax. Scattered areas of interstitial prominence and extensive architectural distortion are again noted in the lungs, most evident in the right mid to lower lung, similar to numerous prior examinations, most compatible with areas of chronic post infectious or inflammatory scarring. No definite suspicious appearing pulmonary nodules or masses are noted. No evidence of pulmonary edema. Heart size is normal. Atherosclerotic calcifications are noted within the thoracic aorta. IMPRESSION: 1. No definite radiographic evidence of acute cardiopulmonary disease. 2. Chronic interstitial prominence in the lungs and scattered areas of scarring, most evident throughout the right lung, similar to numerous prior examinations. 3. Aortic atherosclerosis. Electronically Signed   By: Vinnie Langton M.D.   On: 03/06/2022 05:14   DG Chest  Port 1 View  Result Date: 03/06/2022 CLINICAL DATA:  Fevers and tremor. EXAM: PORTABLE CHEST 1 VIEW COMPARISON:  December 30, 2021 FINDINGS: The heart size and mediastinal contours are within normal limits. Radiopaque surgical clips are seen overlying the lateral aspect of the upper  left lung. Mild to moderate severity diffuse, chronic appearing increased interstitial lung markings are seen. Mild, stable areas of linear scarring and/or atelectasis are seen within the mid right lung and right lung base. There is no evidence of a pleural effusion or pneumothorax. There is moderate severity levoscoliosis of the lower thoracic and upper lumbar spine. Multilevel degenerative changes are also seen throughout the thoracolumbar spine. IMPRESSION: Chronic appearing increased interstitial lung markings with mild, stable mid right lung and right basilar linear scarring and/or atelectasis. Electronically Signed   By: Virgina Norfolk M.D.   On: 03/06/2022 03:18    Orson Eva, DO  [facility]  If 7PM-7AM, please contact night-coverage [email or web address] Password [email or web address] 03/08/2022, 1:28 PM   LOS: 2 days

## 2022-03-08 NOTE — Progress Notes (Signed)
Dressing change performed per order to left foot. Patient tolerated well.

## 2022-03-09 DIAGNOSIS — A419 Sepsis, unspecified organism: Secondary | ICD-10-CM | POA: Diagnosis not present

## 2022-03-09 DIAGNOSIS — I739 Peripheral vascular disease, unspecified: Secondary | ICD-10-CM | POA: Diagnosis not present

## 2022-03-09 DIAGNOSIS — I480 Paroxysmal atrial fibrillation: Secondary | ICD-10-CM | POA: Diagnosis not present

## 2022-03-09 DIAGNOSIS — L03032 Cellulitis of left toe: Secondary | ICD-10-CM | POA: Diagnosis not present

## 2022-03-09 LAB — CBC
HCT: 27.8 % — ABNORMAL LOW (ref 36.0–46.0)
Hemoglobin: 8.8 g/dL — ABNORMAL LOW (ref 12.0–15.0)
MCH: 32.1 pg (ref 26.0–34.0)
MCHC: 31.7 g/dL (ref 30.0–36.0)
MCV: 101.5 fL — ABNORMAL HIGH (ref 80.0–100.0)
Platelets: 275 10*3/uL (ref 150–400)
RBC: 2.74 MIL/uL — ABNORMAL LOW (ref 3.87–5.11)
RDW: 15.1 % (ref 11.5–15.5)
WBC: 12.7 10*3/uL — ABNORMAL HIGH (ref 4.0–10.5)
nRBC: 0 % (ref 0.0–0.2)

## 2022-03-09 LAB — CREATININE, SERUM
Creatinine, Ser: 0.92 mg/dL (ref 0.44–1.00)
GFR, Estimated: >60 mL/min (ref >60)

## 2022-03-09 MED ORDER — AMOXICILLIN-POT CLAVULANATE 875-125 MG PO TABS: 1.0000 | ORAL_TABLET | Freq: Two times a day (BID) | ORAL | 0 refills | Status: DC

## 2022-03-09 MED ORDER — DOXYCYCLINE HYCLATE 100 MG PO TABS: 100.0000 mg | ORAL_TABLET | Freq: Two times a day (BID) | ORAL | 0 refills | Status: DC

## 2022-03-09 MED ORDER — AMOXICILLIN-POT CLAVULANATE 875-125 MG PO TABS
1.0000 | ORAL_TABLET | Freq: Two times a day (BID) | ORAL | Status: DC
  Administered 2022-03-09: 1 via ORAL
  Filled 2022-03-09: qty 1

## 2022-03-09 MED ORDER — DOXYCYCLINE HYCLATE 100 MG PO TABS
100.0000 mg | ORAL_TABLET | Freq: Two times a day (BID) | ORAL | Status: DC
  Administered 2022-03-09: 100 mg via ORAL
  Filled 2022-03-09: qty 1

## 2022-03-09 NOTE — Care Management Important Message (Signed)
Important Message  Patient Details  Name: [name] MRN: [id number] Date of Birth: Jul 06, 1940   Medicare Important Message Given:  Yes     Tommy Medal 03/09/2022, 10:27 AM

## 2022-03-09 NOTE — Progress Notes (Signed)
Wound care provided to left foot per order. Patient tolerated well.

## 2022-03-09 NOTE — Progress Notes (Signed)
Nsg Discharge Note  Admit Date:  03/05/2022 Discharge date: 03/09/2022   Kennon Holter to be D/C'd Home per MD order.  AVS completed.  Copy for chart, and copy for patient signed, and dated. Patient/caregiver able to verbalize understanding.  Discharge Medication: Allergies as of 03/09/2022       Reactions   Iodine Rash, Other (See Comments)   BETADINE Rash/burning, blisters on skin. Burns skin   Iohexol Rash, Other (See Comments)   Blisters; PT NEEDS 13-HOUR PREP   Sulfa Antibiotics Nausea And Vomiting   Petroleum Gauze Non-woven 3x9" [wound Dressings] Other (See Comments)   BURNING SENSATION, RED SKIN   Povidone-iodine Other (See Comments)   Burns skin   Tape Itching, Rash, Other (See Comments)   "DO NOT USE ADHESIVE TAPE" Not even a Band Aid" NO PAPER TAPE Burns skin Skin is very very thin        Medication List     STOP taking these medications    docusate sodium 100 MG capsule Commonly known as: COLACE   oxyCODONE 5 MG immediate release tablet Commonly known as: Oxy IR/ROXICODONE       TAKE these medications    acetaminophen 325 MG tablet Commonly known as: TYLENOL Take 2 tablets (650 mg total) by mouth every 6 (six) hours as needed for mild pain (or Fever >/= 101).   amiodarone 200 MG tablet Commonly known as: PACERONE Take 1 tablet (200 mg total) by mouth daily.   amoxicillin-clavulanate 875-125 MG tablet Commonly known as: AUGMENTIN Take 1 tablet by mouth every 12 (twelve) hours.   ascorbic acid 500 MG tablet Commonly known as: VITAMIN C Take 500 mg by mouth daily.   CALCIUM PO Take 1,200 mg by mouth daily.   clopidogrel 75 MG tablet Commonly known as: PLAVIX TAKE 1 TABLET EVERY MORNING   denosumab 60 MG/ML Sosy injection Commonly known as: PROLIA Inject 60 mg into the skin every 6 (six) months.   doxycycline 100 MG tablet Commonly known as: VIBRA-TABS Take 1 tablet (100 mg total) by mouth every 12 (twelve) hours.   Eliquis 2.5 MG  Tabs tablet Generic drug: apixaban TAKE 1 TABLET TWICE A DAY   furosemide 20 MG tablet Commonly known as: LASIX Take 1 tablet (20 mg total) by mouth daily. Only as needed PRN   IRON PO Take 65 mg by mouth daily at 6 (six) AM.   MAGNESIUM OXIDE PO Take 500 mg by mouth every morning.   metoprolol tartrate 25 MG tablet Commonly known as: LOPRESSOR TAKE 3 TABLETS TWICE A DAY   multivitamin with minerals Tabs tablet Take 1 tablet by mouth every morning.   pantoprazole 40 MG tablet Commonly known as: PROTONIX TAKE 1 TABLET DAILY   potassium chloride 10 MEQ tablet Commonly known as: KLOR-CON M Take 10 mEq by mouth daily.   pravastatin 40 MG tablet Commonly known as: PRAVACHOL Take 1 tablet (40 mg total) by mouth daily.   Santyl 250 UNIT/GM ointment Generic drug: collagenase Apply 1 Application topically daily. What changed: Another medication with the same name was removed. Continue taking this medication, and follow the directions you see here.   Spiriva Respimat 2.5 MCG/ACT Aers Generic drug: Tiotropium Bromide Monohydrate USE 2 INHALATIONS BY MOUTH  DAILY        Discharge Assessment: Vitals:   03/09/22 0538 03/09/22 0818  BP: (!) 149/75   Pulse: 62   Resp: 16   Temp: 97.8 F (36.6 C)   SpO2: 96% 96%  Skin clean, dry and intact without evidence of skin break down, no evidence of skin tears noted. IV catheter discontinued intact. Site without signs and symptoms of complications - no redness or edema noted at insertion site, patient denies c/o pain - only slight tenderness at site.  Dressing with slight pressure applied.  D/c Instructions-Education: Discharge instructions given to patient/family with verbalized understanding. D/c education completed with patient/family including follow up instructions, medication list, d/c activities limitations if indicated, with other d/c instructions as indicated by MD - patient able to verbalize understanding, all questions  fully answered. Patient instructed to return to ED, call 911, or call MD for any changes in condition.  Patient escorted via [place], and D/C home via private auto.  Clovis Fredrickson, LPN 49/10/261 78:58 AM

## 2022-03-09 NOTE — Discharge Summary (Addendum)
Physician Discharge Summary   Patient: [name] MRN: [id number] DOB: Jan 24, 1941  Admit date:     03/05/2022  Discharge date: 03/09/22  Discharge Physician: Shanon Brow Laural Eiland   PCP: Celene Squibb, MD   Recommendations at discharge:   Please follow up with primary care provider within 1-2 weeks  Please repeat BMP and CBC in one week      Hospital Course:  81 y.o. female with medical history significant of atrial fibrillation, essential tremors, moderate protein-calorie malnutrition, hypertension, hyperlipidemia, history of stroke, type 2 diabetes mellitus with nephropathy and peripheral arterial disease; who presented to the hospital secondary to febrile illness and worsening tremors for 1 week.   She did not take her temp at home.  She went to see her PCP on 03/05/22 who spoke with [name] and her amiodarone was stopped.  She continued to have what she felt were worsening tremors.  Therefore, she presented to the emergency department for further evaluation and treatment.. In retrospect, the patient now states that she has noted 1 week history of increasing erythema and edema of her left foot.  She is status post left first ray amputation 12/29/2021--Dr. Boneta Lucks. Patient denies fevers, chills, headache, chest pain, dyspnea, nausea, vomiting, diarrhea, abdominal pain, dysuria, hematuria, hematochezia, and melena. In the ED, the patient had a temperature up to 102.0 F with tachycardia and soft blood pressures.  Lactic acid peaked at 2.5.  WBC was 34.5.  The patient was started on vancomycin and cefepime initially.  Assessment and Plan:  Severe sepsis ([place]) -Present at time of admission and presumably secondary to left foot cellulitis  -Patient with elevated WBCs, fever/rigors, tachycardia and tachypnea; lactic acid 2.5. -Continue current IV Vanco and ceftriaxone during hospitalization -Gentle fluid initiated in the ED>>saline locked after 24 hours -Personally reviewed chest x-ray--chronic  interstitial prominence -sepsis physiology resolved   Cellulitis left foot/leg -Status post left first ray amputation for osteomyelitis of the great toe -Finish 10 days of Doxy and Augmentin on 01/09/2022 -CRP 13.3 -ESR 52 -01/15/2022 ABIs--normal bilateral -Continue vancomycin and ceftriaxone during hospitalization -WBC 34.5>>22.3>>15.3>>12.7 -clinically edema, pain and erythema are improving>>see serial pics daily -d/c home with doxy and amox/clav x 6 more days   Paroxysmal Atrial fibrillation (HCC) -amiodarone was stopped on 11/26 prior to admission due to pt complaint of "worsen tremor" -in retrospect pt likely had rigors and fever (not worsen tremor) as she presented with fever 102.0 -tremor better than usual baseline during hospitalization -after discussion with patient, she wishes to restart amio and follow up with [name] -Continue Eliquis and metoprolol. -HR initially elevated due to sepsis>>now rate controlled  Tremor --amiodarone was stopped on 11/26 prior to admission due to pt complaint of "worsen tremor" -in retrospect pt likely had rigors and fever (not truly worsen tremor) as she presented with fever 102.0 -tremor better than usual baseline during hospitalization -after discussion with patient, she wishes to restart amio and follow up with [name] -Hold mysoline with which pt agrees due to interaction with apixaban -Outpatient follow-up with Dr. Carles Collet  PAD (peripheral artery disease) [facility]) --01/15/2022 ABIs--normal bilateral -Continue follow-up with [facility] -Wound care service consultation for recommendations appreciated -Follow clinical response.   Chronic diastolic heart failure: -Stable and compensated -Patient denies orthopnea, dyspnea, worsen edema -stable on RA -continue home dose furosemide  Abnormal Thyroid Functions -TSH 5.333 -FT4 = 1.26 -partly attributable to amiodarone -would repeat in 1 month and monitor as outpt  Protein-calorie  malnutrition, moderate (HCC) -Good oral  nutrition/Hydration and feeding supplement discussed with patient. -Body mass index is 18.27 kg/m.   GERD (gastroesophageal reflux disease) -Continue PPI.   Essential hypertension -Stable overall -BPs soft initially -continue metoprolol   Hyperlipemia -Continue statin   History of breast cancer -Status post bilateral mastectomy, chemo and radiation -Appears to be currently in remission -Continue outpatient follow-up screening.   Severe osteopenia/osteoporosis -Continue outpatient treatment with Prolia after discharge.                   Consultants: none Procedures performed: none  Disposition: Home Diet recommendation:  Cardiac diet DISCHARGE MEDICATION: Allergies as of 03/09/2022       Reactions   Iodine Rash, Other (See Comments)   BETADINE Rash/burning, blisters on skin. Burns skin   Iohexol Rash, Other (See Comments)   Blisters; PT NEEDS 13-HOUR PREP   Sulfa Antibiotics Nausea And Vomiting   Petroleum Gauze Non-woven 3x9" [wound Dressings] Other (See Comments)   BURNING SENSATION, RED SKIN   Povidone-iodine Other (See Comments)   Burns skin   Tape Itching, Rash, Other (See Comments)   "DO NOT USE ADHESIVE TAPE" Not even a Band Aid" NO PAPER TAPE Burns skin Skin is very very thin        Medication List     STOP taking these medications    docusate sodium 100 MG capsule Commonly known as: COLACE   oxyCODONE 5 MG immediate release tablet Commonly known as: Oxy IR/ROXICODONE       TAKE these medications    acetaminophen 325 MG tablet Commonly known as: TYLENOL Take 2 tablets (650 mg total) by mouth every 6 (six) hours as needed for mild pain (or Fever >/= 101).   amiodarone 200 MG tablet Commonly known as: PACERONE Take 1 tablet (200 mg total) by mouth daily.   amoxicillin-clavulanate 875-125 MG tablet Commonly known as: AUGMENTIN Take 1 tablet by mouth every 12 (twelve) hours.    ascorbic acid 500 MG tablet Commonly known as: VITAMIN C Take 500 mg by mouth daily.   CALCIUM PO Take 1,200 mg by mouth daily.   clopidogrel 75 MG tablet Commonly known as: PLAVIX TAKE 1 TABLET EVERY MORNING   denosumab 60 MG/ML Sosy injection Commonly known as: PROLIA Inject 60 mg into the skin every 6 (six) months.   doxycycline 100 MG tablet Commonly known as: VIBRA-TABS Take 1 tablet (100 mg total) by mouth every 12 (twelve) hours.   Eliquis 2.5 MG Tabs tablet Generic drug: apixaban TAKE 1 TABLET TWICE A DAY   furosemide 20 MG tablet Commonly known as: LASIX Take 1 tablet (20 mg total) by mouth daily. Only as needed PRN   IRON PO Take 65 mg by mouth daily at 6 (six) AM.   MAGNESIUM OXIDE PO Take 500 mg by mouth every morning.   metoprolol tartrate 25 MG tablet Commonly known as: LOPRESSOR TAKE 3 TABLETS TWICE A DAY   multivitamin with minerals Tabs tablet Take 1 tablet by mouth every morning.   pantoprazole 40 MG tablet Commonly known as: PROTONIX TAKE 1 TABLET DAILY   potassium chloride 10 MEQ tablet Commonly known as: KLOR-CON M Take 10 mEq by mouth daily.   pravastatin 40 MG tablet Commonly known as: PRAVACHOL Take 1 tablet (40 mg total) by mouth daily.   Santyl 250 UNIT/GM ointment Generic drug: collagenase Apply 1 Application topically daily. What changed: Another medication with the same name was removed. Continue taking this medication, and follow the directions you see here.  Spiriva Respimat 2.5 MCG/ACT Aers Generic drug: Tiotropium Bromide Monohydrate USE 2 INHALATIONS BY MOUTH  DAILY        Follow-up Information     [facility] Follow up.   Why: Will contact you to schedule home health visits.               Discharge Exam: Filed Weights   03/07/22 0322 03/08/22 0353 03/09/22 0500  Weight: 48.6 kg 48.9 kg 48.6 kg   HEENT:  [place]/AT, No thrush, no icterus CV:  RRR, no rub, no S3, no S4 Lung:  CTA, no wheeze, no  rhonchi Abd:  soft/+BS, NT Ext:  1  + RLE edema, no lymphangitis, no synovitis, no rash   Condition at discharge: stable  The results of significant diagnostics from this hospitalization (including imaging, microbiology, ancillary and laboratory) are listed below for reference.   Imaging Studies: DG Foot 2 Views Left  Result Date: 03/06/2022 CLINICAL DATA:  [address]. Recent left toe amputation. Concern for infection. EXAM: LEFT FOOT - 2 VIEW COMPARISON:  Preoperative left foot series 12/25/2021 FINDINGS: Profound osteopenia is again noted. There is diffuse edema in the foot greatest in the forefoot. There is artifact from overlying clothing or dressing wrap material. There has been interval first ray amputation from the mid first metatarsal shaft forward. No destructive bone lesion is seen definitive for acute osteomyelitis. Joint spaces are maintained with normal interosseous alignment. No radiopaque foreign body or convincing soft tissue gas identified allowing for overlying artifact. There are vascular calcifications in the distal foreleg and foot. IMPRESSION: 1. Profound osteopenia. 2. Diffuse edema in the foot greatest in the forefoot. 3. Interval first ray amputation. 4. No destructive bone lesion or convincing soft tissue gas identified allowing for overlying artifact. Electronically Signed   By: Telford Nab M.D.   On: 03/06/2022 05:19   DG Chest Port 1 View  Result Date: 03/06/2022 CLINICAL DATA:  81 year old female with history of worsening respiratory status. Fever and shortness of breath. EXAM: PORTABLE CHEST 1 VIEW COMPARISON:  Chest x-ray 03/06/2022. FINDINGS: Lung volumes are normal. No confluent consolidative airspace disease. No pleural effusions. No pneumothorax. Scattered areas of interstitial prominence and extensive architectural distortion are again noted in the lungs, most evident in the right mid to lower lung, similar to numerous prior examinations, most compatible with  areas of chronic post infectious or inflammatory scarring. No definite suspicious appearing pulmonary nodules or masses are noted. No evidence of pulmonary edema. Heart size is normal. Atherosclerotic calcifications are noted within the thoracic aorta. IMPRESSION: 1. No definite radiographic evidence of acute cardiopulmonary disease. 2. Chronic interstitial prominence in the lungs and scattered areas of scarring, most evident throughout the right lung, similar to numerous prior examinations. 3. Aortic atherosclerosis. Electronically Signed   By: Vinnie Langton M.D.   On: 03/06/2022 05:14   DG Chest Port 1 View  Result Date: 03/06/2022 CLINICAL DATA:  Fevers and tremor. EXAM: PORTABLE CHEST 1 VIEW COMPARISON:  December 30, 2021 FINDINGS: The heart size and mediastinal contours are within normal limits. Radiopaque surgical clips are seen overlying the lateral aspect of the upper left lung. Mild to moderate severity diffuse, chronic appearing increased interstitial lung markings are seen. Mild, stable areas of linear scarring and/or atelectasis are seen within the mid right lung and right lung base. There is no evidence of a pleural effusion or pneumothorax. There is moderate severity levoscoliosis of the lower thoracic and upper lumbar spine. Multilevel degenerative changes are also seen  throughout the thoracolumbar spine. IMPRESSION: Chronic appearing increased interstitial lung markings with mild, stable mid right lung and right basilar linear scarring and/or atelectasis. Electronically Signed   By: Virgina Norfolk M.D.   On: 03/06/2022 03:18    Microbiology: Results for orders placed or performed during the hospital encounter of 03/05/22  Culture, blood (Routine X 2) w Reflex to ID Panel     Status: None (Preliminary result)   Collection Time: 03/06/22  2:41 AM   Specimen: BLOOD  Result Value Ref Range Status   Specimen Description BLOOD BLOOD RIGHT ARM  Final   Special Requests   Final     BOTTLES DRAWN AEROBIC AND ANAEROBIC Blood Culture adequate volume   Culture   Final    NO GROWTH 2 DAYS Performed at [facility], [address] [place], [place] [address]    Report Status PENDING  Incomplete  Culture, blood (Routine X 2) w Reflex to ID Panel     Status: None (Preliminary result)   Collection Time: 03/06/22  2:43 AM   Specimen: BLOOD  Result Value Ref Range Status   Specimen Description BLOOD BLOOD LEFT HAND  Final   Special Requests   Final    BOTTLES DRAWN AEROBIC ONLY Blood Culture results may not be optimal due to an inadequate volume of blood received in culture bottles   Culture   Final    NO GROWTH 2 DAYS Performed at [facility], [address] [place], [place] [address]    Report Status PENDING  Incomplete  Resp Panel by RT-PCR (Flu A&B, Covid) Anterior Nasal Swab     Status: None   Collection Time: 03/06/22  3:15 AM   Specimen: Anterior Nasal Swab  Result Value Ref Range Status   SARS Coronavirus 2 by RT PCR NEGATIVE NEGATIVE Final    Comment: (NOTE) SARS-CoV-2 target nucleic acids are NOT DETECTED.  The SARS-CoV-2 RNA is generally detectable in upper respiratory specimens during the acute phase of infection. The lowest concentration of SARS-CoV-2 viral copies this assay can detect is 138 copies/mL. A negative result does not preclude SARS-Cov-2 infection and should not be used as the sole basis for treatment or other patient management decisions. A negative result may occur with  improper specimen collection/handling, submission of specimen other than nasopharyngeal swab, presence of viral mutation(s) within the areas targeted by this assay, and inadequate number of viral copies(<138 copies/mL). A negative result must be combined with clinical observations, patient history, and epidemiological information. The expected result is Negative.  Fact Sheet for Patients:  EntrepreneurPulse.com.au  Fact Sheet for Healthcare Providers:   IncredibleEmployment.be  This test is no t yet approved or cleared by the [place] FDA and  has been authorized for detection and/or diagnosis of SARS-CoV-2 by FDA under an Emergency Use Authorization (EUA). This EUA will remain  in effect (meaning this test can be used) for the duration of the COVID-19 declaration under Section 564(b)(1) of the Act, 21 U.S.C.section 360bbb-3(b)(1), unless the authorization is terminated  or revoked sooner.       Influenza A by PCR NEGATIVE NEGATIVE Final   Influenza B by PCR NEGATIVE NEGATIVE Final    Comment: (NOTE) The Xpert Xpress SARS-CoV-2/FLU/RSV plus assay is intended as an aid in the diagnosis of influenza from Nasopharyngeal swab specimens and should not be used as a sole basis for treatment. Nasal washings and aspirates are unacceptable for Xpert Xpress SARS-CoV-2/FLU/RSV testing.  Fact Sheet for Patients: EntrepreneurPulse.com.au  Fact Sheet for  Healthcare Providers: IncredibleEmployment.be  This test is not yet approved or cleared by the [place] and has been authorized for detection and/or diagnosis of SARS-CoV-2 by FDA under an Emergency Use Authorization (EUA). This EUA will remain in effect (meaning this test can be used) for the duration of the COVID-19 declaration under Section 564(b)(1) of the Act, 21 U.S.C. section 360bbb-3(b)(1), unless the authorization is terminated or revoked.  Performed at [facility], [address], [place], [place] [address]   Urine Culture     Status: None   Collection Time: 03/06/22  5:20 AM   Specimen: Urine, Catheterized  Result Value Ref Range Status   Specimen Description   Final    URINE, CATHETERIZED Performed at [facility], [address] [place], [place] [address]    Special Requests   Final    NONE Performed at [facility], [address] [place], [place] [address]    Culture   Final    NO GROWTH Performed  at [facility], [place] [address], [place], [place] [address]    Report Status 03/07/2022 FINAL  Final    Labs: CBC: Recent Labs  Lab 03/05/22 2318 03/07/22 0530 03/07/22 0849 03/08/22 0444 03/09/22 0506  WBC 34.5* 22.2* 22.3* 15.3* 12.7*  NEUTROABS 32.6*  --   --   --   --   HGB 11.7* 8.4* 8.9* 9.5* 8.8*  HCT 36.1 25.8* 27.6* 30.7* 27.8*  MCV 100.3* 99.2 100.0 104.4* 101.5*  PLT 327 237 230 262 494   Basic Metabolic Panel: Recent Labs  Lab 03/05/22 2318 03/06/22 1004 03/07/22 0530 03/08/22 0444 03/09/22 0506  NA 136  --  133* 134*  --   K 4.4  --  3.7 4.1  --   CL 101  --  104 104  --   CO2 24  --  23 21*  --   GLUCOSE 95  --  80 94  --   BUN 24*  --  24* 38*  --   CREATININE 1.15*  --  1.03* 1.01* 0.92  CALCIUM 9.1  --  7.7* 7.9*  --   MG  --  2.2  --   --   --   PHOS  --  2.2*  --   --   --    Liver Function Tests: Recent Labs  Lab 03/05/22 2318  AST 20  ALT 12  ALKPHOS 56  BILITOT 0.7  PROT 7.3  ALBUMIN 3.4*   CBG: Recent Labs  Lab 03/05/22 1955  GLUCAP 122*    Discharge time spent: greater than 30 minutes.  Signed: Orson Eva, MD [facility] 03/09/2022

## 2022-03-11 LAB — CULTURE, BLOOD (ROUTINE X 2)
Culture: NO GROWTH
Culture: NO GROWTH
Special Requests: ADEQUATE

## 2022-03-16 ENCOUNTER — Ambulatory Visit (INDEPENDENT_AMBULATORY_CARE_PROVIDER_SITE_OTHER): Payer: Medicare HMO | Admitting: Podiatry

## 2022-03-16 DIAGNOSIS — L97522 Non-pressure chronic ulcer of other part of left foot with fat layer exposed: Secondary | ICD-10-CM

## 2022-03-16 NOTE — Progress Notes (Signed)
Subjective:  Patient ID: [name], female    DOB: [birth year],  MRN: [id number]  Chief Complaint  Patient presents with   Routine Post Op    DOS: 12/29/2021 Procedure: Left partial first ray amputation with excisional debridement of wound with application of skin graft  81 y.o. female returns for post-op check.  Patient states she is doing okay.  Minimal pain no acute complaints.  Review of Systems: Negative except as noted in the HPI. Denies N/V/F/Ch.  Past Medical History:  Diagnosis Date   Anemia 05/2014.   Atrial fibrillation ([place])    x 3 yrs   Breast cancer ([facility])    S/P left mastectomy and chemotherapy 1989 remained in remission   Carotid artery occlusion    Carotid bruit    LICA 03-00% (duplex 9/23)   Degenerative arthritis of spine 2015   Chronic back pain   Hyperlipidemia    Hypertension    Meningioma ([place])    Osteoporosis    Pneumonia May 19, 2014   Stroke [facility])    CVA 2008   Subclavian steal syndrome    Ulcers of both lower extremities ([place]) 2015    Current Outpatient Medications:    acetaminophen (TYLENOL) 325 MG tablet, Take 2 tablets (650 mg total) by mouth every 6 (six) hours as needed for mild pain (or Fever >/= 101)., Disp: , Rfl:    amiodarone (PACERONE) 200 MG tablet, Take 1 tablet (200 mg total) by mouth daily. (Patient not taking: Reported on 03/06/2022), Disp: 90 tablet, Rfl: 3   amoxicillin-clavulanate (AUGMENTIN) 875-125 MG tablet, Take 1 tablet by mouth every 12 (twelve) hours., Disp: 12 tablet, Rfl: 0   ascorbic acid (VITAMIN C) 500 MG tablet, Take 500 mg by mouth daily., Disp: , Rfl:    CALCIUM PO, Take 1,200 mg by mouth daily., Disp: , Rfl:    clopidogrel (PLAVIX) 75 MG tablet, TAKE 1 TABLET EVERY MORNING, Disp: 90 tablet, Rfl: 3   collagenase (SANTYL) 250 UNIT/GM ointment, Apply 1 Application topically daily., Disp: 15 g, Rfl: 0   denosumab (PROLIA) 60 MG/ML SOSY injection, Inject 60 mg into the skin every 6 (six) months., Disp: , Rfl:     doxycycline (VIBRA-TABS) 100 MG tablet, Take 1 tablet (100 mg total) by mouth every 12 (twelve) hours., Disp: 12 tablet, Rfl: 0   ELIQUIS 2.5 MG TABS tablet, TAKE 1 TABLET TWICE A DAY, Disp: 180 tablet, Rfl: 1   Ferrous Sulfate (IRON PO), Take 65 mg by mouth daily at 6 (six) AM., Disp: , Rfl:    furosemide (LASIX) 20 MG tablet, Take 1 tablet (20 mg total) by mouth daily. Only as needed PRN, Disp: 30 tablet, Rfl:    MAGNESIUM OXIDE PO, Take 500 mg by mouth every morning., Disp: , Rfl:    metoprolol tartrate (LOPRESSOR) 25 MG tablet, TAKE 3 TABLETS TWICE A DAY, Disp: 540 tablet, Rfl: 3   Multiple Vitamin (MULTIVITAMIN WITH MINERALS) TABS tablet, Take 1 tablet by mouth every morning., Disp: , Rfl:    pantoprazole (PROTONIX) 40 MG tablet, TAKE 1 TABLET DAILY, Disp: 90 tablet, Rfl: 3   potassium chloride (KLOR-CON M) 10 MEQ tablet, Take 10 mEq by mouth daily., Disp: , Rfl:    pravastatin (PRAVACHOL) 40 MG tablet, Take 1 tablet (40 mg total) by mouth daily., Disp: 90 tablet, Rfl: 3   Tiotropium Bromide Monohydrate (SPIRIVA RESPIMAT) 2.5 MCG/ACT AERS, USE 2 INHALATIONS BY MOUTH  DAILY, Disp: 12 g, Rfl: 3  Social History  Tobacco Use  Smoking Status Former   Packs/day: 1.00   Years: 56.00   Total pack years: 56.00   Types: Cigarettes   Quit date: 08/14/2006   Years since quitting: 15.5   Passive exposure: Never  Smokeless Tobacco Never  Tobacco Comments   quit in 2008    Allergies  Allergen Reactions   Iodine Rash and Other (See Comments)    BETADINE Rash/burning, blisters on skin. Burns skin   Iohexol Rash and Other (See Comments)    Blisters; PT NEEDS 13-HOUR PREP    Sulfa Antibiotics Nausea And Vomiting   Petroleum Gauze Non-Woven 3x9" [Wound Dressings] Other (See Comments)    BURNING SENSATION, RED SKIN   Povidone-Iodine Other (See Comments)    Burns skin   Tape Itching, Rash and Other (See Comments)    "DO NOT USE ADHESIVE TAPE" Not even a Band Aid" NO PAPER TAPE Burns  skin Skin is very very thin   Objective:  There were no vitals filed for this visit. There is no height or weight on file to calculate BMI. Constitutional Well developed. Well nourished.  Vascular Foot warm and well perfused. Capillary refill normal to all digits.   Neurologic Normal speech. Oriented to person, place, and time. Epicritic sensation to light touch grossly present bilaterally.  Dermatologic Left dorsal foot wound with fat layer exposed.  Granular/fibrogranular wound base noted.  No malodor present no purulent drainage noted.  Orthopedic: No tenderness to palpation noted about the surgical site.   Radiographs: None Assessment:   No diagnosis found.   Plan:  Patient was evaluated and treated and all questions answered.  S/p foot surgery left -Clinic healed.  No signs of dehiscence noted.  No complications noted.  Left dorsal foot wound with fat layer exposed -Minimal debridement was carried out.  Patient does have granular with some fibrotic patches at the wound base.   -Continue Santyl wet-to-dry as there is more granulation tissue noted. -continue surgical shoe -If there is no improvement we will discuss skin graft options. No follow-ups on file.    No follow-ups on file.

## 2022-04-06 ENCOUNTER — Other Ambulatory Visit: Payer: Self-pay | Admitting: *Deleted

## 2022-04-23 ENCOUNTER — Other Ambulatory Visit: Payer: Self-pay

## 2022-04-23 MED ORDER — AMIODARONE HCL 200 MG PO TABS: 200.0000 mg | ORAL_TABLET | Freq: Every day | ORAL | 2 refills | Status: DC

## 2022-04-24 ENCOUNTER — Telehealth: Payer: Self-pay

## 2022-04-24 NOTE — Telephone Encounter (Signed)
Pt's medication amiodarone was D/C per Dr. Eden Emms on 03/05/22. Medication no longer on pt's medication list. Pt is aware of this.

## 2022-04-27 ENCOUNTER — Ambulatory Visit (INDEPENDENT_AMBULATORY_CARE_PROVIDER_SITE_OTHER): Payer: Medicare HMO | Admitting: Nurse Practitioner

## 2022-04-27 ENCOUNTER — Encounter: Payer: Self-pay | Admitting: Nurse Practitioner

## 2022-04-27 VITALS — BP 128/78 | HR 88 | Temp 98.0°F | Ht 61.5 in | Wt 97.2 lb

## 2022-04-27 DIAGNOSIS — R918 Other nonspecific abnormal finding of lung field: Secondary | ICD-10-CM

## 2022-04-27 DIAGNOSIS — J449 Chronic obstructive pulmonary disease, unspecified: Secondary | ICD-10-CM | POA: Diagnosis not present

## 2022-04-27 MED ORDER — INCRUSE ELLIPTA 62.5 MCG/ACT IN AEPB: 1.0000 | INHALATION_SPRAY | Freq: Every day | RESPIRATORY_TRACT | 5 refills | Status: DC

## 2022-04-27 NOTE — Patient Instructions (Addendum)
Continue Albuterol inhaler 2 puffs every 6 hours as needed for shortness of breath or wheezing. Notify if symptoms persist despite rescue inhaler/neb use.  Stop Spiriva. Change to Incruse 1 puff daily.   Maintain up to date vaccinations Activity as tolerated  Follow up in 6 months with Dr. Marchelle Gearing or Philis Nettle. If symptoms worsen, please contact office for sooner follow up or seek emergency care.

## 2022-04-27 NOTE — Assessment & Plan Note (Signed)
Moderate COPD with low symptom burden.  No recent exacerbations or hospitalizations due to her breathing.  She is compensated on current regimen with LAMA therapy.  We will change her to Incruse, which is the preferred alternative by her insurance.  Instructed on proper use today.  Medication education provided.  Action plan in place.  Encouraged to remain active.  Patient Instructions  Continue Albuterol inhaler 2 puffs every 6 hours as needed for shortness of breath or wheezing. Notify if symptoms persist despite rescue inhaler/neb use.  Stop Spiriva. Change to Incruse 1 puff daily.   Maintain up to date vaccinations Activity as tolerated  Follow up in 6 months with Dr. Marchelle Gearing or Philis Nettle. If symptoms worsen, please contact office for sooner follow up or seek emergency care.

## 2022-04-27 NOTE — Assessment & Plan Note (Signed)
History of small, scattered stable pulmonary nodules.  No further follow-up is required.  She is not a candidate for the lung cancer screening program given her age and quit smoking greater than 15 years ago.

## 2022-04-27 NOTE — Progress Notes (Signed)
@Patient  ID: [name], female    DOB: Jul 21, 1940, 82 y.o.   MRN: [id number]  Chief Complaint  Patient presents with   Follow-up    Doing well.  No sx noted today.  Spiriva no longer covered.    Referring provider: Celene Squibb, MD  HPI: 82 year old female, former smoker followed for COPD.  She is a patient of Dr. Golden Pop and last seen in office 10/25/2021 by Belenda Cruise NP.  Past medical history significant for hypertension, A-fib on Eliquis, CHF, CAD, HLD, anemia, chronic back pain, history of CVA.  She had a recurrent cavitary pneumonia on the right side which she was treated for in 2016 and again in 2017; recovered well with resolution of pneumonia on scan from April 2017.  She has not had any further pneumonias/infections since.  TEST/EVENTS:  07/08/2015 PFTs: FVC 76, FEV1 70, ratio 67, TLC 98, DLCOunc 46 07/11/2015 CT chest without contrast: Incidentally noted lesion in the right lobe of the thyroid.  Atherosclerosis is present.  There are calcified subcarinal lymph node.  Small hiatal hernia.  There is right apical pleural-parenchymal scarring.  Mild centrilobular emphysema.  Postinfectious scarring in the right middle and lower lobes.  There are a few scattered pulmonary nodules measuring 4 mm or less in size and unchanged, some of these are calcified.  12/22/2019: OV with Dr. Chase Caller.  Doing well on Spiriva; however concerned that it can be quite expensive when she runs into the donut hole.  She was provided with 2 samples and advised to call in the future if she had any issues with affordability.  Discussed getting third COVID booster, which she was encouraged to do so given her long history.  10/25/2021: OV with Marae Cottrell NP for overdue follow-up.  She reports that she has been feeling well.  She has no concerns or complaints regarding her breathing.  She really does not notice any significant shortness of breath.  No limits in her daily activities.  Her husband reports that she walks laps  around him at the grocery store.  She is on Spiriva daily, which works well for her.  She never uses her albuterol inhaler.  She has not required prednisone or antibiotics for acute exacerbations of her breathing.  04/27/2022: Today - follow up Patient presents today for 84-month follow-up.  She was recently admitted 03/05/2022 to the hospital for sepsis secondary to left foot cellulitis.  She was treated with IV vancomycin and ceftriaxone and transition to doxycycline and Augmentin to complete a 10-day course.  She was discharged home on 03/09/2022 after clinically improving.  Today, she tells me that she is feeling much better.  Feels like her breathing is at her baseline.  No significant concerns or complaints.  She does not have any cough, associated wheezing, or chest congestion.  Her insurance notified her that they will no longer be covering her Spiriva.  She needs an alternative inhaler for this.  Rare use of albuterol.  Allergies  Allergen Reactions   Iodine Rash and Other (See Comments)    BETADINE Rash/burning, blisters on skin. Burns skin   Iohexol Rash and Other (See Comments)    Blisters; PT NEEDS 13-HOUR PREP    Sulfa Antibiotics Nausea And Vomiting   Petroleum Gauze Non-Woven 3x9" [Wound Dressings] Other (See Comments)    BURNING SENSATION, RED SKIN   Povidone-Iodine Other (See Comments)    Burns skin   Tape Itching, Rash and Other (See Comments)    "DO NOT USE ADHESIVE  TAPE" Not even a Band Aid" NO PAPER TAPE Burns skin Skin is very very thin    Immunization History  Administered Date(s) Administered   Influenza Split 01/07/2014   Influenza, High Dose Seasonal PF 01/28/2016, 01/07/2017, 12/17/2018   Influenza,inj,Quad PF,6+ Mos 01/12/2015   Influenza,inj,quad, With Preservative 12/16/2020   Influenza-Unspecified 01/07/2013, 01/01/2022   Moderna Sars-Covid-2 Vaccination 05/02/2019, 06/01/2019   Pneumococcal Conjugate-13 01/07/2014   Pneumococcal Polysaccharide-23  01/07/2017   Tdap 08/01/2011   Zoster Recombinat (Shingrix) 01/10/2017    Past Medical History:  Diagnosis Date   Anemia 05/2014.   Atrial fibrillation ([place])    x 3 yrs   Breast cancer ([place])    S/P left mastectomy and chemotherapy 1989 remained in remission   Carotid artery occlusion    Carotid bruit    LICA 16-10% (duplex 9/60)   Degenerative arthritis of spine 2015   Chronic back pain   Hyperlipidemia    Hypertension    Meningioma ([place])    Osteoporosis    Pneumonia May 19, 2014   Stroke [facility])    CVA 2008   Subclavian steal syndrome    Ulcers of both lower extremities ([place]) 2015    Tobacco History: Social History   Tobacco Use  Smoking Status Former   Packs/day: 1.00   Years: 56.00   Total pack years: 56.00   Types: Cigarettes   Quit date: 08/14/2006   Years since quitting: 15.7   Passive exposure: Never  Smokeless Tobacco Never  Tobacco Comments   quit in 2008   Counseling given: Not Answered Tobacco comments: quit in 2008   Outpatient Medications Prior to Visit  Medication Sig Dispense Refill   acetaminophen (TYLENOL) 325 MG tablet Take 2 tablets (650 mg total) by mouth every 6 (six) hours as needed for mild pain (or Fever >/= 101).     ascorbic acid (VITAMIN C) 500 MG tablet Take 500 mg by mouth daily.     CALCIUM PO Take 1,200 mg by mouth daily.     clopidogrel (PLAVIX) 75 MG tablet TAKE 1 TABLET EVERY MORNING 90 tablet 3   denosumab (PROLIA) 60 MG/ML SOSY injection Inject 60 mg into the skin every 6 (six) months.     ELIQUIS 2.5 MG TABS tablet TAKE 1 TABLET TWICE A DAY 180 tablet 1   Ferrous Sulfate (IRON PO) Take 65 mg by mouth daily at 6 (six) AM.     furosemide (LASIX) 20 MG tablet Take 1 tablet (20 mg total) by mouth daily. Only as needed PRN 30 tablet    MAGNESIUM OXIDE PO Take 500 mg by mouth every morning.     metoprolol tartrate (LOPRESSOR) 25 MG tablet TAKE 3 TABLETS TWICE A DAY 540 tablet 3   Multiple Vitamin (MULTIVITAMIN WITH MINERALS)  TABS tablet Take 1 tablet by mouth every morning.     pantoprazole (PROTONIX) 40 MG tablet TAKE 1 TABLET DAILY 90 tablet 3   potassium chloride (KLOR-CON M) 10 MEQ tablet Take 10 mEq by mouth daily.     pravastatin (PRAVACHOL) 40 MG tablet Take 1 tablet (40 mg total) by mouth daily. 90 tablet 3   SANTYL 250 UNIT/GM ointment APPLY TO AFFECTED AREAS ONCE DAILY. 30 g 0   Tiotropium Bromide Monohydrate (SPIRIVA RESPIMAT) 2.5 MCG/ACT AERS USE 2 INHALATIONS BY MOUTH  DAILY 12 g 3   amoxicillin-clavulanate (AUGMENTIN) 875-125 MG tablet Take 1 tablet by mouth every 12 (twelve) hours. (Patient not taking: Reported on 04/27/2022) 12 tablet 0   doxycycline (  VIBRA-TABS) 100 MG tablet Take 1 tablet (100 mg total) by mouth every 12 (twelve) hours. (Patient not taking: Reported on 04/27/2022) 12 tablet 0   No facility-administered medications prior to visit.     Review of Systems:   Constitutional: No weight loss or gain, night sweats, fevers, chills, fatigue, or lassitude. HEENT: No headaches, difficulty swallowing, tooth/dental problems, or sore throat. No sneezing, itching, ear ache, nasal congestion, or post nasal drip CV:  No chest pain, orthopnea, PND, swelling in lower extremities, anasarca, dizziness, palpitations, syncope Resp: No shortness of breath with exertion or at rest. No excess mucus or change in color of mucus. No productive or non-productive. No hemoptysis. No wheezing.  No chest wall deformity Skin: No rash, lesions, ulcerations MSK:  No joint pain or swelling.  No decreased range of motion.  No back pain. Neuro: No dizziness or lightheadedness.  Psych: No depression or anxiety. Mood stable.     Physical Exam:  BP 128/78 (BP Location: Left Arm, Patient Position: Sitting, Cuff Size: Normal)   Pulse 88   Temp 98 F (36.7 C) (Oral)   Ht 5' 1.5" (1.562 m)   Wt 97 lb 3.2 oz (44.1 kg)   SpO2 97%   BMI 18.07 kg/m   GEN: Pleasant, interactive, well-appearing; in no acute  distress HEENT:  Normocephalic and atraumatic. PERRLA. Sclera white. Nasal turbinates pink, moist and patent bilaterally. No rhinorrhea present. Oropharynx pink and moist, without exudate or edema. No lesions, ulcerations, or postnasal drip.  NECK:  Supple w/ fair ROM. No JVD present.  CV: Irregular rhythm, rate controlled, no m/r/g, no peripheral edema. Pulses intact, +2 bilaterally. No cyanosis, pallor or clubbing. PULMONARY:  Unlabored, regular breathing. Clear bilaterally A&P w/o wheezes/rales/rhonchi. No accessory muscle use. No dullness to percussion. GI: BS present and normoactive. Soft, non-tender to palpation. No organomegaly or masses detected.  MSK: No erythema, warmth or tenderness. Cap refil <2 sec all extrem. Neuro: A/Ox3. No focal deficits noted.   Skin: Warm, no lesions or rashe Psych: Normal affect and behavior. Judgement and thought content appropriate.     Lab Results:  CBC    Component Value Date/Time   WBC 12.7 (H) 03/09/2022 0506   RBC 2.74 (L) 03/09/2022 0506   HGB 8.8 (L) 03/09/2022 0506   HCT 27.8 (L) 03/09/2022 0506   PLT 275 03/09/2022 0506   MCV 101.5 (H) 03/09/2022 0506   MCH 32.1 03/09/2022 0506   MCHC 31.7 03/09/2022 0506   RDW 15.1 03/09/2022 0506   LYMPHSABS 0.6 (L) 03/05/2022 2318   MONOABS 0.8 03/05/2022 2318   EOSABS 0.0 03/05/2022 2318   BASOSABS 0.1 03/05/2022 2318    BMET    Component Value Date/Time   NA 134 (L) 03/08/2022 0444   NA 136 (A) 08/11/2010 0000   K 4.1 03/08/2022 0444   CL 104 03/08/2022 0444   CO2 21 (L) 03/08/2022 0444   GLUCOSE 94 03/08/2022 0444   BUN 38 (H) 03/08/2022 0444   BUN 21 08/11/2010 0000   CREATININE 0.92 03/09/2022 0506   CREATININE 0.57 (L) 03/14/2015 1230   CALCIUM 7.9 (L) 03/08/2022 0444   GFRNONAA >60 03/09/2022 0506   GFRAA >60 02/20/2015 0418    BNP    Component Value Date/Time   BNP 1,274.0 (H) 03/06/2022 0240     Imaging:  No results found.       Latest Ref Rng & Units  07/08/2015    7:53 AM  PFT Results  FVC-Pre L 2.09  FVC-Predicted Pre % 76   FVC-Post L 1.96   FVC-Predicted Post % 71   Pre FEV1/FVC % % 69   Post FEV1/FCV % % 67   FEV1-Pre L 1.44   FEV1-Predicted Pre % 70   FEV1-Post L 1.31   DLCO uncorrected ml/min/mmHg 11.33   DLCO UNC% % 46   DLVA Predicted % 73   TLC L 4.96   TLC % Predicted % 98   RV % Predicted % 135     No results found for: "NITRICOXIDE"      Assessment & Plan:   COPD, moderate (HCC) Moderate COPD with low symptom burden.  No recent exacerbations or hospitalizations due to her breathing.  She is compensated on current regimen with LAMA therapy.  We will change her to Incruse, which is the preferred alternative by her insurance.  Instructed on proper use today.  Medication education provided.  Action plan in place.  Encouraged to remain active.  Patient Instructions  Continue Albuterol inhaler 2 puffs every 6 hours as needed for shortness of breath or wheezing. Notify if symptoms persist despite rescue inhaler/neb use.  Stop Spiriva. Change to Incruse 1 puff daily.   Maintain up to date vaccinations Activity as tolerated  Follow up in 6 months with Dr. Marchelle Gearing or Philis Nettle. If symptoms worsen, please contact office for sooner follow up or seek emergency care.    Lung nodules History of small, scattered stable pulmonary nodules.  No further follow-up is required.  She is not a candidate for the lung cancer screening program given her age and quit smoking greater than 15 years ago.   I spent 25 minutes of dedicated to the care of this patient on the date of this encounter to include pre-visit review of records, face-to-face time with the patient discussing conditions above, post visit ordering of testing, clinical documentation with the electronic health record, making appropriate referrals as documented, and communicating necessary findings to members of the patients care team.  Noemi Chapel,  NP 04/27/2022  Pt aware and understands NP's role.

## 2022-05-01 ENCOUNTER — Ambulatory Visit: Payer: Medicare HMO | Admitting: Cardiovascular Disease

## 2022-05-03 ENCOUNTER — Ambulatory Visit (INDEPENDENT_AMBULATORY_CARE_PROVIDER_SITE_OTHER): Payer: Medicare HMO | Admitting: Podiatry

## 2022-05-03 VITALS — BP 128/80

## 2022-05-03 DIAGNOSIS — L97522 Non-pressure chronic ulcer of other part of left foot with fat layer exposed: Secondary | ICD-10-CM

## 2022-05-03 MED ORDER — DOXYCYCLINE HYCLATE 100 MG PO TABS: 100.0000 mg | ORAL_TABLET | Freq: Two times a day (BID) | ORAL | 0 refills | Status: AC

## 2022-05-03 NOTE — Progress Notes (Signed)
Subjective:  Patient ID: [name], female    DOB: 10-09-1940,  MRN: [id number]  Chief Complaint  Patient presents with   Foot Ulcer    DOS: 12/29/2021 Procedure: Left partial first ray amputation with excisional debridement of wound with application of skin graft  82 y.o. female returns for post-op check.  Patient states that she is doing okay.  The top of the foot wound is doing much better.  She has a new wound to the left lateral ankle she wanted to get it evaluated.  Review of Systems: Negative except as noted in the HPI. Denies N/V/F/Ch.  Past Medical History:  Diagnosis Date   Anemia 05/2014.   Atrial fibrillation ([place])    x 3 yrs   Breast cancer ([place])    S/P left mastectomy and chemotherapy 1989 remained in remission   Carotid artery occlusion    Carotid bruit    LICA 81-27% (duplex 5/17)   Degenerative arthritis of spine 2015   Chronic back pain   Hyperlipidemia    Hypertension    Meningioma ([place])    Osteoporosis    Pneumonia May 19, 2014   Stroke [facility])    CVA 2008   Subclavian steal syndrome    Ulcers of both lower extremities ([place]) 2015    Current Outpatient Medications:    acetaminophen (TYLENOL) 325 MG tablet, Take 2 tablets (650 mg total) by mouth every 6 (six) hours as needed for mild pain (or Fever >/= 101)., Disp: , Rfl:    amoxicillin-clavulanate (AUGMENTIN) 875-125 MG tablet, Take 1 tablet by mouth every 12 (twelve) hours. (Patient not taking: Reported on 04/27/2022), Disp: 12 tablet, Rfl: 0   ascorbic acid (VITAMIN C) 500 MG tablet, Take 500 mg by mouth daily., Disp: , Rfl:    CALCIUM PO, Take 1,200 mg by mouth daily., Disp: , Rfl:    clopidogrel (PLAVIX) 75 MG tablet, TAKE 1 TABLET EVERY MORNING, Disp: 90 tablet, Rfl: 3   denosumab (PROLIA) 60 MG/ML SOSY injection, Inject 60 mg into the skin every 6 (six) months., Disp: , Rfl:    doxycycline (VIBRA-TABS) 100 MG tablet, Take 1 tablet (100 mg total) by mouth every 12 (twelve) hours for 14 days.,  Disp: 12 tablet, Rfl: 0   doxycycline (VIBRA-TABS) 100 MG tablet, Take 1 tablet (100 mg total) by mouth 2 (two) times daily for 14 days., Disp: 28 tablet, Rfl: 0   ELIQUIS 2.5 MG TABS tablet, TAKE 1 TABLET TWICE A DAY, Disp: 180 tablet, Rfl: 1   Ferrous Sulfate (IRON PO), Take 65 mg by mouth daily at 6 (six) AM., Disp: , Rfl:    furosemide (LASIX) 20 MG tablet, Take 1 tablet (20 mg total) by mouth daily. Only as needed PRN, Disp: 30 tablet, Rfl:    MAGNESIUM OXIDE PO, Take 500 mg by mouth every morning., Disp: , Rfl:    metoprolol tartrate (LOPRESSOR) 25 MG tablet, TAKE 3 TABLETS TWICE A DAY, Disp: 540 tablet, Rfl: 3   Multiple Vitamin (MULTIVITAMIN WITH MINERALS) TABS tablet, Take 1 tablet by mouth every morning., Disp: , Rfl:    pantoprazole (PROTONIX) 40 MG tablet, TAKE 1 TABLET DAILY, Disp: 90 tablet, Rfl: 3   potassium chloride (KLOR-CON M) 10 MEQ tablet, Take 10 mEq by mouth daily., Disp: , Rfl:    pravastatin (PRAVACHOL) 40 MG tablet, Take 1 tablet (40 mg total) by mouth daily., Disp: 90 tablet, Rfl: 3   SANTYL 250 UNIT/GM ointment, APPLY TO AFFECTED AREAS ONCE DAILY., Disp:  30 g, Rfl: 0   umeclidinium bromide (INCRUSE ELLIPTA) 62.5 MCG/ACT AEPB, Inhale 1 puff into the lungs daily., Disp: 30 each, Rfl: 5  Social History   Tobacco Use  Smoking Status Former   Packs/day: 1.00   Years: 56.00   Total pack years: 56.00   Types: Cigarettes   Quit date: 08/14/2006   Years since quitting: 15.7   Passive exposure: Never  Smokeless Tobacco Never  Tobacco Comments   quit in 2008    Allergies  Allergen Reactions   Iodine Rash and Other (See Comments)    BETADINE Rash/burning, blisters on skin. Burns skin   Iohexol Rash and Other (See Comments)    Blisters; PT NEEDS 13-HOUR PREP    Sulfa Antibiotics Nausea And Vomiting   Petroleum Gauze Non-Woven 3x9" [Wound Dressings] Other (See Comments)    BURNING SENSATION, RED SKIN   Povidone-Iodine Other (See Comments)    Burns skin   Tape  Itching, Rash and Other (See Comments)    "DO NOT USE ADHESIVE TAPE" Not even a Band Aid" NO PAPER TAPE Burns skin Skin is very very thin   Objective:   Vitals:   05/03/22 1016  BP: 128/80   There is no height or weight on file to calculate BMI. Constitutional Well developed. Well nourished.  Vascular Foot warm and well perfused. Capillary refill normal to all digits.   Neurologic Normal speech. Oriented to person, place, and time. Epicritic sensation to light touch grossly present bilaterally.  Dermatologic Left dorsal foot wound with fat layer exposed which is improving.  Granular/fibrogranular wound base noted.  No malodor present no purulent drainage noted.  Left lateral ankle with fat layer exposed.  No signs of infection noted.  No malodor present does not probe down to bone.  Orthopedic: No tenderness to palpation noted about the surgical site.   Radiographs: None Assessment:   No diagnosis found.   Plan:  Patient was evaluated and treated and all questions answered.  S/p foot surgery left -Clinic healed.  No signs of dehiscence noted.  No complications noted.  Left dorsal foot wound with fat layer exposed -Minimal debridement was carried out.  -Clinically improved considerably.  Only very small granular wound noted.  Will continue local wound care for now.  Continue applying Santyl wet-to-dry  Left lateral ankle with fat layer exposed -Patient has a granular ulceration to the left lateral which appears to be new.  It is primarily fibrogranular wound.  At this time patient will benefit from continue application of Santyl wet-to-dry dressing.  I discussed with patient she states understanding. No follow-ups on file.    No follow-ups on file.  Improved considerably.   New ulcer left lateral ankle continue Santyl wet-to-dry

## 2022-05-04 ENCOUNTER — Telehealth: Payer: Self-pay | Admitting: *Deleted

## 2022-05-04 ENCOUNTER — Other Ambulatory Visit: Payer: Self-pay

## 2022-05-04 MED ORDER — DOXYCYCLINE HYCLATE 100 MG PO TABS: 100.0000 mg | ORAL_TABLET | Freq: Two times a day (BID) | ORAL | 0 refills | Status: DC

## 2022-05-04 NOTE — Telephone Encounter (Signed)
Patient's husband is calling because they need clarification on medication , instructions say to take for 14 days, twice daily but only received 12 tablets, is she supposed to take for only 6 days? Please advise.

## 2022-05-07 ENCOUNTER — Telehealth: Payer: Self-pay | Admitting: Cardiovascular Disease

## 2022-05-07 ENCOUNTER — Telehealth: Payer: Self-pay | Admitting: Podiatry

## 2022-05-07 DIAGNOSIS — R251 Tremor, unspecified: Secondary | ICD-10-CM

## 2022-05-07 NOTE — Telephone Encounter (Signed)
Sent referral to  preferred Neurologist.

## 2022-05-07 NOTE — Telephone Encounter (Signed)
Pts husband called back and says he checked with pharmacy and was told that there isnt a second RX sent in.   Please advise.

## 2022-05-07 NOTE — Telephone Encounter (Signed)
Pts husband has concerns about medication.Pt will not have enough pills to last the 14 days.   They were given:  doxycycline (VIBRA-TABS) 100 MG tablet  Sig - Route: Take 1 tablet (100 mg total) by mouth every 12 (twelve) hours for 14 days.  Disp: 12 tablets   [place] [place] [facility] [place]  [address], [place] [address]   Please advise

## 2022-05-07 NOTE — Telephone Encounter (Signed)
Pt requesting new referral to see Dr. Carles Collet, Neurologist. Please advise

## 2022-05-08 ENCOUNTER — Encounter: Payer: Self-pay | Admitting: Neurology

## 2022-05-08 ENCOUNTER — Other Ambulatory Visit: Payer: Self-pay

## 2022-05-08 MED ORDER — DOXYCYCLINE HYCLATE 100 MG PO TABS: 100.0000 mg | ORAL_TABLET | Freq: Two times a day (BID) | ORAL | 0 refills | Status: AC

## 2022-05-24 ENCOUNTER — Inpatient Hospital Stay (HOSPITAL_COMMUNITY)
Admission: EM | Admit: 2022-05-24 | Discharge: 2022-05-26 | DRG: 291 | Disposition: A | Discharge status: Home Health | Payer: Medicare HMO | Attending: Family Medicine | Admitting: Family Medicine

## 2022-05-24 ENCOUNTER — Inpatient Hospital Stay (HOSPITAL_COMMUNITY): Payer: Medicare HMO

## 2022-05-24 ENCOUNTER — Other Ambulatory Visit (HOSPITAL_COMMUNITY): Payer: Self-pay | Admitting: *Deleted

## 2022-05-24 ENCOUNTER — Other Ambulatory Visit: Payer: Self-pay

## 2022-05-24 ENCOUNTER — Emergency Department (HOSPITAL_COMMUNITY): Payer: Medicare HMO

## 2022-05-24 ENCOUNTER — Encounter (HOSPITAL_COMMUNITY): Payer: Self-pay | Admitting: Emergency Medicine

## 2022-05-24 DIAGNOSIS — Z8249 Family history of ischemic heart disease and other diseases of the circulatory system: Secondary | ICD-10-CM | POA: Diagnosis not present

## 2022-05-24 DIAGNOSIS — Z1152 Encounter for screening for COVID-19: Secondary | ICD-10-CM | POA: Diagnosis not present

## 2022-05-24 DIAGNOSIS — I110 Hypertensive heart disease with heart failure: Secondary | ICD-10-CM | POA: Diagnosis present

## 2022-05-24 DIAGNOSIS — Z681 Body mass index (BMI) 19 or less, adult: Secondary | ICD-10-CM | POA: Diagnosis not present

## 2022-05-24 DIAGNOSIS — Z91041 Radiographic dye allergy status: Secondary | ICD-10-CM | POA: Diagnosis not present

## 2022-05-24 DIAGNOSIS — R531 Weakness: Secondary | ICD-10-CM

## 2022-05-24 DIAGNOSIS — E440 Moderate protein-calorie malnutrition: Secondary | ICD-10-CM | POA: Diagnosis present

## 2022-05-24 DIAGNOSIS — Z9012 Acquired absence of left breast and nipple: Secondary | ICD-10-CM

## 2022-05-24 DIAGNOSIS — I4891 Unspecified atrial fibrillation: Secondary | ICD-10-CM | POA: Diagnosis present

## 2022-05-24 DIAGNOSIS — I4819 Other persistent atrial fibrillation: Secondary | ICD-10-CM | POA: Diagnosis present

## 2022-05-24 DIAGNOSIS — I482 Chronic atrial fibrillation, unspecified: Secondary | ICD-10-CM | POA: Diagnosis present

## 2022-05-24 DIAGNOSIS — I509 Heart failure, unspecified: Principal | ICD-10-CM

## 2022-05-24 DIAGNOSIS — D6859 Other primary thrombophilia: Secondary | ICD-10-CM | POA: Diagnosis present

## 2022-05-24 DIAGNOSIS — K219 Gastro-esophageal reflux disease without esophagitis: Secondary | ICD-10-CM | POA: Diagnosis present

## 2022-05-24 DIAGNOSIS — D689 Coagulation defect, unspecified: Secondary | ICD-10-CM | POA: Diagnosis present

## 2022-05-24 DIAGNOSIS — I5031 Acute diastolic (congestive) heart failure: Secondary | ICD-10-CM | POA: Diagnosis not present

## 2022-05-24 DIAGNOSIS — G8929 Other chronic pain: Secondary | ICD-10-CM | POA: Diagnosis present

## 2022-05-24 DIAGNOSIS — J449 Chronic obstructive pulmonary disease, unspecified: Secondary | ICD-10-CM | POA: Diagnosis present

## 2022-05-24 DIAGNOSIS — I5021 Acute systolic (congestive) heart failure: Secondary | ICD-10-CM | POA: Diagnosis present

## 2022-05-24 DIAGNOSIS — Z89422 Acquired absence of other left toe(s): Secondary | ICD-10-CM

## 2022-05-24 DIAGNOSIS — E785 Hyperlipidemia, unspecified: Secondary | ICD-10-CM | POA: Diagnosis present

## 2022-05-24 DIAGNOSIS — Z8673 Personal history of transient ischemic attack (TIA), and cerebral infarction without residual deficits: Secondary | ICD-10-CM

## 2022-05-24 DIAGNOSIS — Z882 Allergy status to sulfonamides status: Secondary | ICD-10-CM

## 2022-05-24 DIAGNOSIS — S91002D Unspecified open wound, left ankle, subsequent encounter: Secondary | ICD-10-CM

## 2022-05-24 DIAGNOSIS — I10 Essential (primary) hypertension: Secondary | ICD-10-CM | POA: Diagnosis present

## 2022-05-24 DIAGNOSIS — Z7901 Long term (current) use of anticoagulants: Secondary | ICD-10-CM

## 2022-05-24 DIAGNOSIS — Z7902 Long term (current) use of antithrombotics/antiplatelets: Secondary | ICD-10-CM | POA: Diagnosis not present

## 2022-05-24 DIAGNOSIS — I480 Paroxysmal atrial fibrillation: Secondary | ICD-10-CM | POA: Diagnosis not present

## 2022-05-24 DIAGNOSIS — M549 Dorsalgia, unspecified: Secondary | ICD-10-CM | POA: Diagnosis present

## 2022-05-24 DIAGNOSIS — Z91048 Other nonmedicinal substance allergy status: Secondary | ICD-10-CM

## 2022-05-24 DIAGNOSIS — E1151 Type 2 diabetes mellitus with diabetic peripheral angiopathy without gangrene: Secondary | ICD-10-CM | POA: Diagnosis present

## 2022-05-24 DIAGNOSIS — I739 Peripheral vascular disease, unspecified: Secondary | ICD-10-CM | POA: Diagnosis not present

## 2022-05-24 DIAGNOSIS — Z87891 Personal history of nicotine dependence: Secondary | ICD-10-CM | POA: Diagnosis not present

## 2022-05-24 DIAGNOSIS — M810 Age-related osteoporosis without current pathological fracture: Secondary | ICD-10-CM | POA: Diagnosis present

## 2022-05-24 DIAGNOSIS — J9601 Acute respiratory failure with hypoxia: Secondary | ICD-10-CM | POA: Diagnosis present

## 2022-05-24 DIAGNOSIS — Z888 Allergy status to other drugs, medicaments and biological substances status: Secondary | ICD-10-CM

## 2022-05-24 DIAGNOSIS — I160 Hypertensive urgency: Secondary | ICD-10-CM | POA: Diagnosis present

## 2022-05-24 DIAGNOSIS — Z9221 Personal history of antineoplastic chemotherapy: Secondary | ICD-10-CM

## 2022-05-24 DIAGNOSIS — Z86011 Personal history of benign neoplasm of the brain: Secondary | ICD-10-CM

## 2022-05-24 DIAGNOSIS — Z853 Personal history of malignant neoplasm of breast: Secondary | ICD-10-CM

## 2022-05-24 DIAGNOSIS — I779 Disorder of arteries and arterioles, unspecified: Secondary | ICD-10-CM | POA: Diagnosis present

## 2022-05-24 DIAGNOSIS — E1140 Type 2 diabetes mellitus with diabetic neuropathy, unspecified: Secondary | ICD-10-CM | POA: Diagnosis present

## 2022-05-24 DIAGNOSIS — S91009A Unspecified open wound, unspecified ankle, initial encounter: Secondary | ICD-10-CM | POA: Diagnosis present

## 2022-05-24 DIAGNOSIS — Z79899 Other long term (current) drug therapy: Secondary | ICD-10-CM

## 2022-05-24 DIAGNOSIS — I2510 Atherosclerotic heart disease of native coronary artery without angina pectoris: Secondary | ICD-10-CM | POA: Diagnosis present

## 2022-05-24 DIAGNOSIS — T501X6A Underdosing of loop [high-ceiling] diuretics, initial encounter: Secondary | ICD-10-CM | POA: Diagnosis present

## 2022-05-24 LAB — CBC
HCT: 37.3 % (ref 36.0–46.0)
Hemoglobin: 11.5 g/dL — ABNORMAL LOW (ref 12.0–15.0)
MCH: 30.5 pg (ref 26.0–34.0)
MCHC: 30.8 g/dL (ref 30.0–36.0)
MCV: 98.9 fL (ref 80.0–100.0)
Platelets: 300 10*3/uL (ref 150–400)
RBC: 3.77 MIL/uL — ABNORMAL LOW (ref 3.87–5.11)
RDW: 18.4 % — ABNORMAL HIGH (ref 11.5–15.5)
WBC: 9.8 10*3/uL (ref 4.0–10.5)
nRBC: 0 % (ref 0.0–0.2)

## 2022-05-24 LAB — RESP PANEL BY RT-PCR (RSV, FLU A&B, COVID)  RVPGX2
Influenza A by PCR: NEGATIVE
Influenza B by PCR: NEGATIVE
Resp Syncytial Virus by PCR: NEGATIVE
SARS Coronavirus 2 by RT PCR: NEGATIVE

## 2022-05-24 LAB — ECHOCARDIOGRAM COMPLETE
Area-P 1/2: 5.23 cm2
Calc EF: 43.5 %
Height: 61.5 in
P 1/2 time: 499 msec
S' Lateral: 3.4 cm
Single Plane A2C EF: 41.6 %
Single Plane A4C EF: 41.8 %
Weight: 1555.57 oz

## 2022-05-24 LAB — TROPONIN I (HIGH SENSITIVITY)
Troponin I (High Sensitivity): 11 ng/L (ref <18)
Troponin I (High Sensitivity): 9 ng/L (ref <18)

## 2022-05-24 LAB — COMPREHENSIVE METABOLIC PANEL
ALT: 12 U/L (ref 0–44)
AST: 25 U/L (ref 15–41)
Albumin: 3.5 g/dL (ref 3.5–5.0)
Alkaline Phosphatase: 49 U/L (ref 38–126)
Anion gap: 11 (ref 5–15)
BUN: 28 mg/dL — ABNORMAL HIGH (ref 8–23)
CO2: 22 mmol/L (ref 22–32)
Calcium: 9.4 mg/dL (ref 8.9–10.3)
Chloride: 102 mmol/L (ref 98–111)
Creatinine, Ser: 1.23 mg/dL — ABNORMAL HIGH (ref 0.44–1.00)
GFR, Estimated: 44 mL/min — ABNORMAL LOW (ref >60)
Glucose, Bld: 130 mg/dL — ABNORMAL HIGH (ref 70–99)
Potassium: 4.2 mmol/L (ref 3.5–5.1)
Sodium: 135 mmol/L (ref 135–145)
Total Bilirubin: 1.1 mg/dL (ref 0.3–1.2)
Total Protein: 8.8 g/dL — ABNORMAL HIGH (ref 6.5–8.1)

## 2022-05-24 LAB — BLOOD GAS, VENOUS
Acid-Base Excess: 0.5 mmol/L (ref 0.0–2.0)
Bicarbonate: 26 mmol/L (ref 20.0–28.0)
Drawn by: 442
O2 Saturation: 19 %
Patient temperature: 36.4
pCO2, Ven: 43 mmHg — ABNORMAL LOW (ref 44–60)
pH, Ven: 7.39 (ref 7.25–7.43)
pO2, Ven: <31 mmHg — CL (ref 32–45)

## 2022-05-24 LAB — BRAIN NATRIURETIC PEPTIDE: B Natriuretic Peptide: 929 pg/mL — ABNORMAL HIGH (ref 0.0–100.0)

## 2022-05-24 MED ORDER — PRAVASTATIN SODIUM 40 MG PO TABS
40.0000 mg | ORAL_TABLET | Freq: Every day | ORAL | Status: DC
  Administered 2022-05-24 – 2022-05-25 (×2): 40 mg via ORAL
  Filled 2022-05-24 (×2): qty 1

## 2022-05-24 MED ORDER — FENTANYL CITRATE PF 50 MCG/ML IJ SOSY: 12.5000 ug | PREFILLED_SYRINGE | INTRAMUSCULAR | Status: DC | PRN

## 2022-05-24 MED ORDER — FUROSEMIDE 10 MG/ML IJ SOLN
30.0000 mg | Freq: Two times a day (BID) | INTRAMUSCULAR | Status: DC
  Administered 2022-05-24 – 2022-05-25 (×2): 30 mg via INTRAVENOUS
  Filled 2022-05-24 (×2): qty 4

## 2022-05-24 MED ORDER — ONDANSETRON HCL 4 MG PO TABS: 4.0000 mg | ORAL_TABLET | Freq: Four times a day (QID) | ORAL | Status: DC | PRN

## 2022-05-24 MED ORDER — MEDIHONEY WOUND/BURN DRESSING EX PSTE
1.0000 | PASTE | Freq: Every day | CUTANEOUS | Status: DC
  Filled 2022-05-24: qty 44

## 2022-05-24 MED ORDER — HYDRALAZINE HCL 20 MG/ML IJ SOLN: 10.0000 mg | INTRAMUSCULAR | Status: DC | PRN

## 2022-05-24 MED ORDER — BISACODYL 5 MG PO TBEC: 5.0000 mg | DELAYED_RELEASE_TABLET | Freq: Every day | ORAL | Status: DC | PRN

## 2022-05-24 MED ORDER — UMECLIDINIUM BROMIDE 62.5 MCG/ACT IN AEPB
1.0000 | INHALATION_SPRAY | Freq: Every day | RESPIRATORY_TRACT | Status: DC
  Administered 2022-05-24 – 2022-05-26 (×3): 1 via RESPIRATORY_TRACT
  Filled 2022-05-24: qty 7

## 2022-05-24 MED ORDER — PANTOPRAZOLE SODIUM 40 MG PO TBEC
40.0000 mg | DELAYED_RELEASE_TABLET | Freq: Every day | ORAL | Status: DC
  Administered 2022-05-25 – 2022-05-26 (×2): 40 mg via ORAL
  Filled 2022-05-24 (×2): qty 1

## 2022-05-24 MED ORDER — TIOTROPIUM BROMIDE MONOHYDRATE 18 MCG IN CAPS: 18.0000 ug | ORAL_CAPSULE | Freq: Every day | RESPIRATORY_TRACT | Status: DC

## 2022-05-24 MED ORDER — POTASSIUM CHLORIDE CRYS ER 20 MEQ PO TBCR
20.0000 meq | EXTENDED_RELEASE_TABLET | Freq: Two times a day (BID) | ORAL | Status: DC
  Administered 2022-05-24 – 2022-05-26 (×4): 20 meq via ORAL
  Filled 2022-05-24 (×4): qty 1

## 2022-05-24 MED ORDER — ADULT MULTIVITAMIN W/MINERALS CH
1.0000 | ORAL_TABLET | Freq: Every morning | ORAL | Status: DC
  Administered 2022-05-25 – 2022-05-26 (×2): 1 via ORAL
  Filled 2022-05-24 (×2): qty 1

## 2022-05-24 MED ORDER — OXYCODONE HCL 5 MG PO TABS: 5.0000 mg | ORAL_TABLET | Freq: Four times a day (QID) | ORAL | Status: DC | PRN

## 2022-05-24 MED ORDER — ACETAMINOPHEN 650 MG RE SUPP: 650.0000 mg | Freq: Four times a day (QID) | RECTAL | Status: DC | PRN

## 2022-05-24 MED ORDER — COLLAGENASE 250 UNIT/GM EX OINT
TOPICAL_OINTMENT | Freq: Every day | CUTANEOUS | Status: DC
  Administered 2022-05-25 – 2022-05-26 (×2): 1 via TOPICAL
  Filled 2022-05-24: qty 30

## 2022-05-24 MED ORDER — ACETAMINOPHEN 325 MG PO TABS: 650.0000 mg | ORAL_TABLET | Freq: Four times a day (QID) | ORAL | Status: DC | PRN

## 2022-05-24 MED ORDER — ONDANSETRON HCL 4 MG/2ML IJ SOLN: 4.0000 mg | Freq: Four times a day (QID) | INTRAMUSCULAR | Status: DC | PRN

## 2022-05-24 MED ORDER — APIXABAN 2.5 MG PO TABS
2.5000 mg | ORAL_TABLET | Freq: Two times a day (BID) | ORAL | Status: DC
  Administered 2022-05-24 – 2022-05-26 (×4): 2.5 mg via ORAL
  Filled 2022-05-24 (×4): qty 1

## 2022-05-24 MED ORDER — TRAZODONE HCL 50 MG PO TABS
25.0000 mg | ORAL_TABLET | Freq: Every evening | ORAL | Status: DC | PRN
  Filled 2022-05-24: qty 1

## 2022-05-24 MED ORDER — FUROSEMIDE 10 MG/ML IJ SOLN
40.0000 mg | Freq: Once | INTRAMUSCULAR | Status: AC
  Administered 2022-05-24: 40 mg via INTRAVENOUS
  Filled 2022-05-24: qty 4

## 2022-05-24 MED ORDER — CLOPIDOGREL BISULFATE 75 MG PO TABS
75.0000 mg | ORAL_TABLET | Freq: Every morning | ORAL | Status: DC
  Administered 2022-05-25 – 2022-05-26 (×2): 75 mg via ORAL
  Filled 2022-05-24 (×2): qty 1

## 2022-05-24 MED ORDER — METOPROLOL TARTRATE 50 MG PO TABS
75.0000 mg | ORAL_TABLET | Freq: Two times a day (BID) | ORAL | Status: DC
  Administered 2022-05-24 – 2022-05-26 (×4): 75 mg via ORAL
  Filled 2022-05-24 (×4): qty 1

## 2022-05-24 MED ORDER — VITAMIN C 500 MG PO TABS
500.0000 mg | ORAL_TABLET | Freq: Every day | ORAL | Status: DC
  Administered 2022-05-25 – 2022-05-26 (×2): 500 mg via ORAL
  Filled 2022-05-24 (×2): qty 1

## 2022-05-24 MED ORDER — IPRATROPIUM-ALBUTEROL 0.5-2.5 (3) MG/3ML IN SOLN: 3.0000 mL | RESPIRATORY_TRACT | Status: DC | PRN

## 2022-05-24 MED ORDER — NITROGLYCERIN 2 % TD OINT
0.5000 [in_us] | TOPICAL_OINTMENT | Freq: Once | TRANSDERMAL | Status: AC
  Administered 2022-05-24: 0.5 [in_us] via TOPICAL
  Filled 2022-05-24: qty 1

## 2022-05-24 NOTE — Consult Note (Addendum)
[facility] Nurse Consult Note: Reason for Consult: Consult requested for topical treatment recommendations for left ankle wound. Performed remotely after review of progress notes.  Pt has been followed as an outpatient by Dr Posey Pronto of the podiatry team and was recently seen in the office on 1/25 to assess chronic wounds to the left anterior foot post-op graft site and left ankle full thickness wound. According to his notes, "Only very small granular wound noted.  Will continue local wound care for now. Continue applying Santyl wet-to-dry. Left lateral ankle with fat layer exposed -Patient has a granular ulceration to the left lateral which appears to be new.  It is primarily fibrogranular wound.  At this time patient will benefit from continue application of Santyl wet-to-dry dressing." Dressing procedure/placement/frequency: Continue present plan of care. Topical treatment orders provided for bedside nurses to perform as follows: Apply Santyl to left anterior foot and left ankle wounds Q day, then cover with moist gauze and foam dressings.  (Change foam dressings Q 3 days or PRN soiling.) Pt should follow-up with podiatry team after discharge.  Please re-consult if further assistance is needed.  Thank-you,  Julien Girt MSN, [place], [place], [place], [place]

## 2022-05-24 NOTE — ED Triage Notes (Signed)
Pt arrives POV c/o SOB when she lays down. Pt also c/o numbness to bilateral arms and HTN for the past few hours.

## 2022-05-24 NOTE — Hospital Course (Signed)
82 year old female with 82 year old female with hypertension, hyperlipidemia, prior CVA,Type 2 diabetes mellitus with neuropathy, PAD s/p vascular surgery, Moderate protein calorie malnutrition, atrial fibrillation, s/p left first ray amputation 9/23 by Dr. Boneta Lucks who presented with shortness of breath when lying recumbent.  She was also having chest tightness and numbness to bilateral arms for past hours prior to arrival.  She denies cough,  fever and chills. She denies chest pain.  Her SOB was worse with lying recumbent.  She has COPD but quit smoking in 2008.  She says that she has not been taking her furosemide regularly because she was only taking it when she had edema in her legs and that has not been an issue for her recently.     In ED she was noted to have pulmonary edema on CXR and elevated BNP and she was given IV lasix and required 2L/min nasal cannula oxygen.  Admission requested for new findings of CHF.

## 2022-05-24 NOTE — Progress Notes (Signed)
  Echocardiogram 2D Echocardiogram has been performed.  [name] 05/24/2022, 1:04 PM

## 2022-05-24 NOTE — TOC Initial Note (Signed)
Transition of Care [facility]) - Initial/Assessment Note    Patient Details  Name: [name] MRN: [id number] Date of Birth: October 28, 1940  Transition of Care [facility]) CM/SW Contact:    Ihor Gully, LCSW Phone Number: 05/24/2022, 2:44 PM  Clinical Narrative:                 Patient from home with spouse. Admitted for Acute heart failure with preserved ejection fraction (HFpEF) ([place]) . Follows HH diet. Does not weigh daily. Independent at baseline. Uses a cane when out walking. Has travel chair for when she is walking long distances. Cooks. Has a person who cleans and a person who does yard work. Showers independently. Active with [facility] HHPT/RN.   Expected Discharge Plan: [place] Barriers to Discharge: Continued Medical Work up   Patient Goals and CMS Choice Patient states their goals for this hospitalization and ongoing recovery are:: return          Expected Discharge Plan and Services       Living arrangements for the past 2 months: Single Family Home                                      Prior Living Arrangements/Services Living arrangements for the past 2 months: Single Family Home Lives with:: Spouse Patient language and need for interpreter reviewed:: Yes        Need for Family Participation in Patient Care: Yes (Comment) Care giver support system in place?: Yes (comment) Current home services: DME, Home PT, Home RN Criminal Activity/Legal Involvement Pertinent to Current Situation/Hospitalization: No - Comment as needed  Activities of Daily Living Home Assistive Devices/Equipment: Cane (specify quad or straight), Walker (specify type), Other (Comment) (built in shower seat) ADL Screening (condition at time of admission) Patient's cognitive ability adequate to safely complete daily activities?: Yes Is the patient deaf or have difficulty hearing?: No Does the patient have difficulty seeing, even when wearing glasses/contacts?: No Does  the patient have difficulty concentrating, remembering, or making decisions?: No Patient able to express need for assistance with ADLs?: Yes Does the patient have difficulty dressing or bathing?: No Independently performs ADLs?: Yes (appropriate for developmental age) Does the patient have difficulty walking or climbing stairs?: Yes Weakness of Legs: Both Weakness of Arms/Hands: None  Permission Sought/Granted                  Emotional Assessment     Affect (typically observed): Appropriate Orientation: : Oriented to Self, Oriented to Place, Oriented to  Time, Oriented to Situation Alcohol / Substance Use: Not Applicable Psych Involvement: No (comment)  Admission diagnosis:  Acute heart failure (HCC) [I50.9] Acute respiratory failure with hypoxia ([place]) [J96.01] Acute congestive heart failure, unspecified heart failure type [facility]) [I50.9] Patient Active Problem List   Diagnosis Date Noted   Acute heart failure with preserved ejection fraction (HFpEF) ([place]) 05/24/2022   Cellulitis and abscess of toe of left foot 03/07/2022   GERD (gastroesophageal reflux disease) 03/06/2022   Atrial flutter with rapid ventricular response ([place])    Severe sepsis ([place]) 12/25/2021   Diarrhea 12/25/2021   Hyperkalemia 12/25/2021   Osteomyelitis of great toe of left foot ([place]) 12/25/2021   PAD (peripheral artery disease) ([place]) 12/19/2021   Osteoporosis    Tibial plateau fracture, left approx. 05/10/17 06/27/2017   COPD, moderate ([place]) 07/12/2015   Multiple gastric ulcers  Coagulopathy ([place])    Bleeding gastrointestinal    Acute GI bleeding    CAD in native artery    Supratherapeutic INR    Right lower lobe lung mass    Cavitary lesion of lung    AKI (acute kidney injury) ([place])    Chronic back pain    GI bleed 02/14/2015   Carotid artery disease ([place]) 02/14/2015   Leukocytosis 02/14/2015   Lung nodules 09/29/2014   Smoking history 09/29/2014   Pneumonia with cavity of lung 07/09/2014    Lung mass 07/09/2014   Thrombocytosis ([place]) 05/21/2014   Protein-calorie malnutrition, moderate (HCC) 05/21/2014   Anemia    Generalized weakness    Necrotic pneumonia ([place]) 05/20/2014   Pneumonia 05/20/2014   Elevated liver enzymes 12/16/2013   Tremor 06/24/2013   Encounter for therapeutic drug monitoring 05/18/2013   Dizziness 01/12/2013   Wound of ankle 10/23/2010   Long term current use of anticoagulant 07/05/2010   Carotid bruit 11/01/2009   Hyperlipidemia 10/26/2009   Essential hypertension 10/26/2009   Atrial fibrillation ([place]) 10/26/2009   ACUTE ON CHRONIC DIASTOLIC HEART FAILURE 10/18/1973   SYNCOPE AND COLLAPSE 10/26/2009   PCP:  Celene Squibb, MD Pharmacy:   [place], [place] [address] [place] [address] Phone: [phone] Fax: [phone]  [facility] [place] [place] [facility] [address] [facility] [address] [place] [address] Phone: [phone] Fax: [phone]     Social Determinants of Health (SDOH) Social History: [place]: No Food Insecurity (05/24/2022)  Housing: Low Risk  (05/24/2022)  Transportation Needs: No Transportation Needs (05/24/2022)  Utilities: Not At Risk (05/24/2022)  Tobacco Use: Medium Risk (05/24/2022)   SDOH Interventions:     Readmission Risk Interventions    03/07/2022    8:09 AM  Readmission Risk Prevention Plan  Transportation Screening Complete  HRI or [place] Complete  Social Work Consult for [place] Planning/Counseling Complete  Palliative Care Screening Not Applicable  Medication Review Press photographer) Complete

## 2022-05-24 NOTE — ED Provider Notes (Signed)
[facility] Provider Note   CSN: [id number] Arrival date & time: 05/24/22  0541     History  Chief Complaint  Patient presents with   Shortness of Breath    [name] is a 82 y.o. female.   Shortness of Breath Associated symptoms: no fever    Patient with extensive history including COPD, atrial fibrillation on anticoagulation, tremor presents with shortness of breath.  Patient reports starting yesterday started having mild shortness of breath.  Tonight she noted it while she was lying flat and is getting worse.  No chest pain, no new back pain.  She does report both of her arms "feel numb and funny" but no focal weakness. No new leg weakness or numbness. No fevers or cough.  She reports medication compliance. Previous history of smoking, quit in 2008    Home Medications Prior to Admission medications   Medication Sig Start Date End Date Taking? Authorizing Provider  acetaminophen (TYLENOL) 325 MG tablet Take 2 tablets (650 mg total) by mouth every 6 (six) hours as needed for mild pain (or Fever >/= 101). 01/02/22   Oswald Hillock, MD  ascorbic acid (VITAMIN C) 500 MG tablet Take 500 mg by mouth daily.    [provider]  CALCIUM PO Take 1,200 mg by mouth daily.    [provider]  clopidogrel (PLAVIX) 75 MG tablet TAKE 1 TABLET EVERY MORNING 08/31/21   Josue Hector, MD  denosumab (PROLIA) 60 MG/ML SOSY injection Inject 60 mg into the skin every 6 (six) months.    Celene Squibb, MD  ELIQUIS 2.5 MG TABS tablet TAKE 1 TABLET TWICE A DAY 11/23/21   Josue Hector, MD  Ferrous Sulfate (IRON PO) Take 65 mg by mouth daily at 6 (six) AM.    [provider]  furosemide (LASIX) 20 MG tablet Take 1 tablet (20 mg total) by mouth daily. Only as needed PRN 01/02/22   Oswald Hillock, MD  MAGNESIUM OXIDE PO Take 500 mg by mouth every morning.    [provider]  metoprolol tartrate (LOPRESSOR) 25 MG tablet TAKE 3  TABLETS TWICE A DAY 10/06/21   Josue Hector, MD  Multiple Vitamin (MULTIVITAMIN WITH MINERALS) TABS tablet Take 1 tablet by mouth every morning.    [provider]  pantoprazole (PROTONIX) 40 MG tablet TAKE 1 TABLET DAILY 08/31/21   Josue Hector, MD  potassium chloride (KLOR-CON M) 10 MEQ tablet Take 10 mEq by mouth daily.    [provider]  pravastatin (PRAVACHOL) 40 MG tablet Take 1 tablet (40 mg total) by mouth daily. 07/20/21   Josue Hector, MD  SANTYL 250 UNIT/GM ointment APPLY TO AFFECTED AREAS ONCE DAILY. 03/30/22   Felipa Furnace, DPM  umeclidinium bromide (INCRUSE ELLIPTA) 62.5 MCG/ACT AEPB Inhale 1 puff into the lungs daily. 04/27/22   Cobb, Karie Schwalbe, NP      Allergies    Iodine, Iohexol, Sulfa antibiotics, Petroleum gauze non-woven 3x9" [wound dressings], Povidone-iodine, and Tape    Review of Systems   Review of Systems  Constitutional:  Negative for fever.  Respiratory:  Positive for shortness of breath.     Physical Exam Updated Vital Signs BP (!) 157/87   Pulse 82   Temp 97.7 F (36.5 C) (Oral)   Resp (!) 26   Ht 1.562 m (5' 1.5")   Wt 44.1 kg   SpO2 94%   BMI 18.07 kg/m  Physical Exam CONSTITUTIONAL: Elderly and frail, anxious HEAD: Normocephalic/atraumatic EYES: EOMI/PERRL ENMT: Mucous membranes moist NECK: supple no meningeal signs SPINE/BACK: Kyphotic spine CV: No loud murmurs LUNGS: Crackles noted in the bases ABDOMEN: soft, nontender NEURO: Pt is awake/alert/appropriate, moves all extremitiesx4.  No facial droop.  Equal hand grips.  No arm drift noted EXTREMITIES: pulses normal/equalx4, full ROM (bandage noted to left foot due to chronic wound per patient) SKIN: warm, color normal PSYCH: Anxious  ED Results / Procedures / Treatments   Labs (all labs ordered are listed, but only abnormal results are displayed) Labs Reviewed  COMPREHENSIVE METABOLIC PANEL - Abnormal; Notable for the following components:      Result Value    Glucose, Bld 130 (*)    BUN 28 (*)    Creatinine, Ser 1.23 (*)    Total Protein 8.8 (*)    GFR, Estimated 44 (*)    All other components within normal limits  CBC - Abnormal; Notable for the following components:   RBC 3.77 (*)    Hemoglobin 11.5 (*)    RDW 18.4 (*)    All other components within normal limits  BLOOD GAS, VENOUS - Abnormal; Notable for the following components:   pCO2, Ven 43 (*)    pO2, Ven <31 (*)    All other components within normal limits  RESP PANEL BY RT-PCR (RSV, FLU A&B, COVID)  [id number]  BRAIN NATRIURETIC PEPTIDE  TROPONIN I (HIGH SENSITIVITY)    EKG EKG Interpretation  Date/Time:  Thursday May 24 2022 05:54:02 EST Ventricular Rate:  85 PR Interval:  172 QRS Duration: 80 QT Interval:  439 QTC Calculation: 523 R Axis:   73 Text Interpretation: indeterminate rhythm Atrial premature complex Anteroseptal infarct, age indeterminate Prolonged QT interval Confirmed by Ripley Fraise [phone]) on 05/24/2022 6:06:47 AM  Radiology DG Chest Portable 1 View  Result Date: 05/24/2022 CLINICAL DATA:  Shortness of breath when laying down EXAM: PORTABLE CHEST 1 VIEW COMPARISON:  03/06/2022 FINDINGS: Diffuse interstitial opacity above prior baseline including Kerley lines. Linear scar-like density in the right lower lung. Borderline heart size. Normal mediastinal contours. Postoperative left axilla. IMPRESSION: CHF. Electronically Signed   By: Jorje Guild M.D.   On: 05/24/2022 06:24    Procedures Procedures    Medications Ordered in ED Medications  nitroGLYCERIN (NITROGLYN) 2 % ointment 0.5 inch (0.5 inches Topical Given 05/24/22 0633)  furosemide (LASIX) injection 40 mg (40 mg Intravenous Given 05/24/22 3235)    ED Course/ Medical Decision Making/ A&P Clinical Course as of 05/24/22 0726  Thu May 24, 2022  0654 Creatinine(!): 1.23 Mild renal insufficiency [DW]  0654 Glucose(!): 130 [DW]  0654 Hyperglycemia [DW]  0711 Patient with extensive history  including CAD, atrial fibrillation, COPD presents with shortness of breath.  She reports acute onset of orthopnea.  Exam revealed bilateral crackles, and did have drops in her pulse ox/desaturation.  Imaging confirms acute CHF which appears to be a new diagnosis for patient.  Patient will be admitted [DW]  0725 New onset CHF [MK]  0725 Signed out to dr kommor to call report [DW]    Clinical Course User Index [DW] Ripley Fraise, MD [MK] Kommor, Debe Coder, MD                             Medical Decision Making Amount and/or Complexity of Data Reviewed Labs: ordered. Decision-making details documented in ED Course. Radiology: ordered. ECG/medicine tests: ordered.  Risk Prescription  drug management. Decision regarding hospitalization.   This patient presents to the ED for concern of shortness of breath, this involves an extensive number of treatment options, and is a complaint that carries with it a high risk of complications and morbidity.  The differential diagnosis includes but is not limited to Acute coronary syndrome, pneumonia, acute pulmonary edema, pneumothorax, acute anemia, pulmonary embolism    Comorbidities that complicate the patient evaluation: Patient's presentation is complicated by their history of COPD, atrial fibrillation  Social Determinants of Health: Patient's  previous history of tobacco use   increases the complexity of managing their presentation  Additional history obtained: Additional history obtained from spouse Records reviewed previous admission documents  Lab Tests: I Ordered, and personally interpreted labs.  The pertinent results include: Mild renal insufficiency, mild hyperglycemia  Imaging Studies ordered: I ordered imaging studies including X-ray chest   I independently visualized and interpreted imaging which showed CHF I agree with the radiologist interpretation  Cardiac Monitoring: The patient was maintained on a cardiac monitor.  I  personally viewed and interpreted the cardiac monitor which showed an underlying rhythm of:  Atrial Fibrillation  Medicines ordered and prescription drug management: I ordered medication including nitroglycerin and Lasix for CHF Reevaluation of the patient after these medicines showed that the patient    stayed the same   Critical Interventions:   initiation of nitrates and diuretics  Reevaluation: After the interventions noted above, I reevaluated the patient and found that they have :stayed the same  Complexity of problems addressed: Patient's presentation is most consistent with  acute presentation with potential threat to life or bodily function  Disposition: After consideration of the diagnostic results and the patient's response to treatment,  I feel that the patent would benefit from admission   .           Final Clinical Impression(s) / ED Diagnoses Final diagnoses:  Acute congestive heart failure, unspecified heart failure type ([place])  Acute respiratory failure with hypoxia [facility])    Rx / DC Orders ED Discharge Orders     None         Ripley Fraise, MD 05/24/22 [phone]

## 2022-05-24 NOTE — H&P (Signed)
History and Physical  [facility]  [name] HLK:[id number] DOB: December 02, 1940 DOA: 05/24/2022  PCP: Celene Squibb, MD  Patient coming from: Home by POV Level of care: Telemetry  I have personally briefly reviewed patient's old medical records in [place]  Chief Complaint: SOB  HPI: [name] is a 82 year old female with 82 year old female with hypertension, hyperlipidemia, prior CVA,Type 2 diabetes mellitus with neuropathy, PAD s/p vascular surgery, Moderate protein calorie malnutrition, atrial fibrillation, s/p left first ray amputation 9/23 by Dr. Boneta Lucks who presented with shortness of breath when lying recumbent.  She was also having chest tightness and numbness to bilateral arms for past hours prior to arrival.  She denies cough,  fever and chills. She denies chest pain.  Her SOB was worse with lying recumbent.  She has COPD but quit smoking in 2008.  She says that she has not been taking her furosemide regularly because she was only taking it when she had edema in her legs and that has not been an issue for her recently.     In ED she was noted to have pulmonary edema on CXR and elevated BNP and she was given IV lasix and required 2L/min nasal cannula oxygen.  Admission requested for new findings of CHF.       Past Medical History:  Diagnosis Date   Anemia 05/2014.   Atrial fibrillation ([place])    x 3 yrs   Breast cancer ([place])    S/P left mastectomy and chemotherapy 1989 remained in remission   Carotid artery occlusion    Carotid bruit    LICA 73-42% (duplex 8/76)   Degenerative arthritis of spine 2015   Chronic back pain   Hyperlipidemia    Hypertension    Meningioma ([place])    Osteoporosis    Pneumonia May 19, 2014   Stroke [facility])    CVA 2008   Subclavian steal syndrome    Ulcers of both lower extremities ([place]) 2015    Past Surgical History:  Procedure Laterality Date   ABDOMINAL AORTOGRAM W/LOWER EXTREMITY N/A 12/19/2021   Procedure: ABDOMINAL  AORTOGRAM W/LOWER EXTREMITY;  Surgeon: Serafina Mitchell, MD;  Location: [place] [facility];  Service: Cardiovascular;  Laterality: N/A;   AMPUTATION TOE Left 12/29/2021   Procedure: Incisional DEBRIDEMENT Left Foot with Amputation Left Great Toe;  Surgeon: Felipa Furnace, DPM;  Location: [place];  Service: Podiatry;  Laterality: Left;   CARDIAC CATHETERIZATION     CATARACT EXTRACTION Bilateral 2013   COLONOSCOPY  11/17/2010   Procedure: COLONOSCOPY;  Surgeon: [name], MD;  [facility];  Service: Endoscopy;  Laterality: N/A;  10:45 am   ESOPHAGOGASTRODUODENOSCOPY  11/17/2010   Procedure: ESOPHAGOGASTRODUODENOSCOPY (EGD);  Surgeon: [name], MD;  [facility];  Service: Endoscopy;  Laterality: N/A;   ESOPHAGOGASTRODUODENOSCOPY N/A 02/16/2015   Procedure: ESOPHAGOGASTRODUODENOSCOPY (EGD);  Surgeon: [name], MD;  Location: [place];  Service: Gastroenterology;  Laterality: N/A;   EXPLORATORY LAPAROTOMY  1960s   For peritonitis of undetermined cause   EYE SURGERY     LUMBAR EPIDURAL INJECTION  06-2012--06-2013   pt. states she has had 5 epidurals in 06-2012----06-2013   MASTECTOMY Left 1990   PERIPHERAL VASCULAR INTERVENTION Left 12/19/2021   Procedure: PERIPHERAL VASCULAR INTERVENTION;  Surgeon: Serafina Mitchell, MD;  Location: [place] [facility];  Service: Cardiovascular;  Laterality: Left;  SFA and PTA of POP   YAG LASER APPLICATION Right 11/17/5724  Procedure: YAG LASER APPLICATION;  Surgeon: [name], MD;  Location: [facility];  Service: Ophthalmology;  Laterality: Right;  right   YAG LASER APPLICATION Left 9/76/7341   Procedure: YAG LASER APPLICATION;  Surgeon: [name], MD;  Location: [facility];  Service: Ophthalmology;  Laterality: Left;     reports that she quit smoking about 15 years ago. Her smoking use included cigarettes. She has a 56.00 pack-year smoking history. She has never been exposed to tobacco smoke. She has never used  smokeless tobacco. She reports that she does not drink alcohol and does not use drugs.  Allergies  Allergen Reactions   Iodine Rash and Other (See Comments)    BETADINE Rash/burning, blisters on skin. Burns skin   Iohexol Rash and Other (See Comments)    Blisters; PT NEEDS 13-HOUR PREP    Sulfa Antibiotics Nausea And Vomiting   Petroleum Gauze Non-Woven 3x9" [Wound Dressings] Other (See Comments)    BURNING SENSATION, RED SKIN   Povidone-Iodine Other (See Comments)    Burns skin   Tape Itching, Rash and Other (See Comments)    "DO NOT USE ADHESIVE TAPE" Not even a Band Aid" NO PAPER TAPE Burns skin Skin is very very thin    Family History  Problem Relation Age of Onset   Alzheimer's disease Mother    Dementia Mother    Hypertension Mother    Hyperlipidemia Mother    Parkinsonism Father     Prior to Admission medications   Medication Sig Start Date End Date Taking? Authorizing Provider  acetaminophen (TYLENOL) 325 MG tablet Take 2 tablets (650 mg total) by mouth every 6 (six) hours as needed for mild pain (or Fever >/= 101). 01/02/22  Yes Darrick Meigs, Marge Duncans, MD  ascorbic acid (VITAMIN C) 500 MG tablet Take 500 mg by mouth daily.   Yes [provider]  CALCIUM PO Take 1,200 mg by mouth daily.   Yes [provider]  clopidogrel (PLAVIX) 75 MG tablet TAKE 1 TABLET EVERY MORNING 08/31/21  Yes Josue Hector, MD  ELIQUIS 2.5 MG TABS tablet TAKE 1 TABLET TWICE A DAY 11/23/21  Yes Josue Hector, MD  Ferrous Sulfate (IRON PO) Take 65 mg by mouth daily at 6 (six) AM.   Yes [provider]  furosemide (LASIX) 20 MG tablet Take 1 tablet (20 mg total) by mouth daily. Only as needed PRN 01/02/22  Yes Lama, Marge Duncans, MD  MAGNESIUM OXIDE PO Take 500 mg by mouth every morning.   Yes [provider]  metoprolol tartrate (LOPRESSOR) 25 MG tablet TAKE 3 TABLETS TWICE A DAY 10/06/21  Yes Josue Hector, MD  Multiple Vitamin (MULTIVITAMIN WITH MINERALS) TABS tablet  Take 1 tablet by mouth every morning.   Yes [provider]  pantoprazole (PROTONIX) 40 MG tablet TAKE 1 TABLET DAILY 08/31/21  Yes Josue Hector, MD  potassium chloride (KLOR-CON M) 10 MEQ tablet Take 10 mEq by mouth daily.   Yes [provider]  pravastatin (PRAVACHOL) 40 MG tablet Take 1 tablet (40 mg total) by mouth daily. 07/20/21  Yes Josue Hector, MD  SANTYL 250 UNIT/GM ointment APPLY TO AFFECTED AREAS ONCE DAILY. 03/30/22  Yes Felipa Furnace, DPM  tiotropium (SPIRIVA) 18 MCG inhalation capsule Place 18 mcg into inhaler and inhale daily.   Yes [provider]  denosumab (PROLIA) 60 MG/ML SOSY injection Inject 60 mg into the skin every 6 (six) months.    Celene Squibb, MD  umeclidinium bromide (INCRUSE ELLIPTA) 62.5 MCG/ACT AEPB Inhale 1 puff into the lungs daily. Patient not taking: Reported on 05/24/2022 04/27/22   Clayton Bibles, NP    Physical Exam: Vitals:   05/24/22 0549 05/24/22 0603 05/24/22 0630 05/24/22 0647  BP:  (!) 176/98 (!) 157/87   Pulse:  85 82 82  Resp:  18 (!) 30 (!) 26  Temp:  97.7 F (36.5 C)    TempSrc:  Oral    SpO2:  93% 91% 94%  Weight: 44.1 kg     Height: 5' 1.5" (1.562 m)       Constitutional: thin, frail female, awake, alert, NAD, calm, comfortable Eyes: PERRL, lids and conjunctivae normal ENMT: Mucous membranes are pale and moist. Posterior pharynx clear of any exudate or lesions.Normal dentition.  Neck: normal, supple, no masses, no thyromegaly Respiratory: bibasilar crackles heard.   Cardiovascular: irregularly irregular, normal s1, s2 sounds, no murmurs / rubs / gallops. Trace extremity edema. 2+ pedal pulses. No carotid bruits.  Abdomen: no tenderness, no masses palpated. No hepatosplenomegaly. Bowel sounds positive.  Musculoskeletal: no clubbing / cyanosis. No joint deformity upper and lower extremities. Good ROM, no contractures. Left ankle wound healing.  Skin: no rashes, lesions, ulcers. No  induration Neurologic: CN 2-12 grossly intact. Sensation intact, DTR normal. Strength 5/5 in all 4.  Psychiatric: Normal judgment and insight. Alert and oriented x 3. Normal mood.   Labs on Admission: I have personally reviewed following labs and imaging studies  CBC: Recent Labs  Lab 05/24/22 0605  WBC 9.8  HGB 11.5*  HCT 37.3  MCV 98.9  PLT 638   Basic Metabolic Panel: Recent Labs  Lab 05/24/22 0605  NA 135  K 4.2  CL 102  CO2 22  GLUCOSE 130*  BUN 28*  CREATININE 1.23*  CALCIUM 9.4   GFR: Estimated Creatinine Clearance: 24.1 mL/min (A) (by C-G formula based on SCr of 1.23 mg/dL (H)). Liver Function Tests: Recent Labs  Lab 05/24/22 0605  AST 25  ALT 12  ALKPHOS 49  BILITOT 1.1  PROT 8.8*  ALBUMIN 3.5   No results for input(s): "LIPASE", "AMYLASE" in the last 168 hours. No results for input(s): "AMMONIA" in the last 168 hours. Coagulation Profile: No results for input(s): "INR", "PROTIME" in the last 168 hours. Cardiac Enzymes: No results for input(s): "CKTOTAL", "CKMB", "CKMBINDEX", "TROPONINI" in the last 168 hours. BNP (last 3 results) No results for input(s): "PROBNP" in the last 8760 hours. HbA1C: No results for input(s): "HGBA1C" in the last 72 hours. CBG: No results for input(s): "GLUCAP" in the last 168 hours. Lipid Profile: No results for input(s): "CHOL", "HDL", "LDLCALC", "TRIG", "CHOLHDL", "LDLDIRECT" in the last 72 hours. Thyroid Function Tests: No results for input(s): "TSH", "T4TOTAL", "FREET4", "T3FREE", "THYROIDAB" in the last 72 hours. Anemia Panel: No results for input(s): "VITAMINB12", "FOLATE", "FERRITIN", "TIBC", "IRON", "RETICCTPCT" in the last 72 hours. Urine analysis:    Component Value Date/Time   COLORURINE AMBER (A) 03/06/2022 0520   APPEARANCEUR CLEAR 03/06/2022 0520   LABSPEC 1.025 03/06/2022 0520   PHURINE 6.0 03/06/2022 0520   GLUCOSEU NEGATIVE 03/06/2022 0520   HGBUR NEGATIVE 03/06/2022 0520   BILIRUBINUR NEGATIVE  03/06/2022 0520   KETONESUR 5 (A) 03/06/2022 0520   PROTEINUR 100 (A) 03/06/2022 0520   UROBILINOGEN 0.2 02/15/2015 0417   NITRITE NEGATIVE 03/06/2022 0520   LEUKOCYTESUR NEGATIVE 03/06/2022 0520    Radiological Exams on Admission: DG Chest Portable 1 View  Result Date: 05/24/2022 CLINICAL DATA:  Shortness of breath when laying down EXAM: PORTABLE CHEST 1 VIEW COMPARISON:  03/06/2022 FINDINGS: Diffuse interstitial opacity above prior baseline including Kerley lines. Linear scar-like density in the right lower lung. Borderline heart size. Normal mediastinal contours. Postoperative left axilla. IMPRESSION: CHF. Electronically Signed   By: Jorje Guild M.D.   On: 05/24/2022 06:24    EKG: Independently reviewed.   Assessment/Plan Principal Problem:   Acute heart failure with preserved ejection fraction (HFpEF) (HCC) Active Problems:   Hyperlipidemia   Essential hypertension   Atrial fibrillation (HCC)   Wound of ankle   Protein-calorie malnutrition, moderate (HCC)   Generalized weakness   Smoking history   Carotid artery disease (HCC)   CAD in native artery   Chronic back pain   Coagulopathy (HCC)   COPD, moderate (HCC)   PAD (peripheral artery disease) (HCC)   GERD (gastroesophageal reflux disease)   Acute HFpEF -pt reports she hasn't taken furosemide regularly -also reports eating out and some high sodium foods -continue IV lasix 30 mg BID -monitor I/O, weight -monitor electrolytes -check new TTE   Hyperlipidemia - resume home pravastatin daily  PAD - severe -s/p revascularization by Dr. Trula Slade to LLE -continue plavix daily   Hypertensive Urgency -resume home metoprolol 75 mg BID -IV hydralazine PRN -IV lasix 30 mg BID for diuresis  GERD -continue daily pantoprazole   Atrial Fibrillation with acquired thrombophilia -continue metoprolol 75 mg BID -continue apixaban BID for full anticoagulation  Left ankle wound -continue local wound care and follow up  with podiatrist    DVT prophylaxis: apixaban   Code Status: Full   Family Communication: husband at bedside   Disposition Plan: anticipating home   Consults called:   Admission status: INP  Level of care: Telemetry Irwin Brakeman MD [facility] How to contact the [facility] Attending or Consulting provider 7A - 7P or covering provider during after hours 7P -7A, for this patient?  Check the care team in [facility] and look for a) attending/consulting [facility] provider listed and b) the [facility] team listed Log into [email or web address] and use [facility] universal password to access. If you do not have the password, please contact the hospital operator. Locate the [facility] provider you are looking for under [facility] and page to a number that you can be directly reached. If you still have difficulty reaching the provider, please page the [facility] (Director on Call) for the Hospitalists listed on amion for assistance.   If 7PM-7AM, please contact night-coverage [email or web address] Password [email or web address]  05/24/2022, 8:03 AM

## 2022-05-25 ENCOUNTER — Inpatient Hospital Stay (HOSPITAL_COMMUNITY): Payer: Medicare HMO

## 2022-05-25 DIAGNOSIS — I4891 Unspecified atrial fibrillation: Secondary | ICD-10-CM

## 2022-05-25 DIAGNOSIS — I5031 Acute diastolic (congestive) heart failure: Secondary | ICD-10-CM | POA: Diagnosis not present

## 2022-05-25 DIAGNOSIS — I5021 Acute systolic (congestive) heart failure: Secondary | ICD-10-CM

## 2022-05-25 DIAGNOSIS — E440 Moderate protein-calorie malnutrition: Secondary | ICD-10-CM | POA: Diagnosis not present

## 2022-05-25 DIAGNOSIS — I779 Disorder of arteries and arterioles, unspecified: Secondary | ICD-10-CM | POA: Diagnosis not present

## 2022-05-25 DIAGNOSIS — K219 Gastro-esophageal reflux disease without esophagitis: Secondary | ICD-10-CM

## 2022-05-25 DIAGNOSIS — I739 Peripheral vascular disease, unspecified: Secondary | ICD-10-CM | POA: Diagnosis not present

## 2022-05-25 LAB — CBC
HCT: 32.6 % — ABNORMAL LOW (ref 36.0–46.0)
Hemoglobin: 10.3 g/dL — ABNORMAL LOW (ref 12.0–15.0)
MCH: 30.2 pg (ref 26.0–34.0)
MCHC: 31.6 g/dL (ref 30.0–36.0)
MCV: 95.6 fL (ref 80.0–100.0)
Platelets: 263 10*3/uL (ref 150–400)
RBC: 3.41 MIL/uL — ABNORMAL LOW (ref 3.87–5.11)
RDW: 18 % — ABNORMAL HIGH (ref 11.5–15.5)
WBC: 8.6 10*3/uL (ref 4.0–10.5)
nRBC: 0 % (ref 0.0–0.2)

## 2022-05-25 LAB — BASIC METABOLIC PANEL
Anion gap: 11 (ref 5–15)
BUN: 33 mg/dL — ABNORMAL HIGH (ref 8–23)
CO2: 26 mmol/L (ref 22–32)
Calcium: 9.1 mg/dL (ref 8.9–10.3)
Chloride: 97 mmol/L — ABNORMAL LOW (ref 98–111)
Creatinine, Ser: 1.33 mg/dL — ABNORMAL HIGH (ref 0.44–1.00)
GFR, Estimated: 39 mL/min — ABNORMAL LOW (ref >60)
Glucose, Bld: 92 mg/dL (ref 70–99)
Potassium: 3.6 mmol/L (ref 3.5–5.1)
Sodium: 134 mmol/L — ABNORMAL LOW (ref 135–145)

## 2022-05-25 LAB — BRAIN NATRIURETIC PEPTIDE: B Natriuretic Peptide: 560 pg/mL — ABNORMAL HIGH (ref 0.0–100.0)

## 2022-05-25 LAB — MAGNESIUM: Magnesium: 1.9 mg/dL (ref 1.7–2.4)

## 2022-05-25 MED ORDER — FUROSEMIDE 10 MG/ML IJ SOLN
30.0000 mg | Freq: Every day | INTRAMUSCULAR | Status: DC
  Filled 2022-05-25: qty 4

## 2022-05-25 NOTE — Progress Notes (Signed)
   05/25/22 0700  ReDS Vest / Clip  Station Marker A  Ruler Value 25  ReDS Value Range < 36  ReDS Actual Value 29

## 2022-05-25 NOTE — Consult Note (Signed)
CARDIOLOGY CONSULT NOTE       Patient ID: [name] MRN: [id number] DOB/AGE: Jun 11, 1940 82 y.o.  Admit date: 05/24/2022 Referring Physician: Irwin Brakeman Primary Physician: Celene Squibb, MD Primary Cardiologist: Johnsie Cancel Reason for Consultation: Acute systolic CHF  Principal Problem:   Acute heart failure with preserved ejection fraction (HFpEF) ([place]) Active Problems:   Hyperlipidemia   Essential hypertension   Atrial fibrillation ([place])   Wound of ankle   Protein-calorie malnutrition, moderate (HCC)   Generalized weakness   Smoking history   Carotid artery disease (HCC)   CAD in native artery   Chronic back pain   Coagulopathy (HCC)   COPD, moderate ([place])   PAD (peripheral artery disease) ([place])   GERD (gastroesophageal reflux disease)   HPI:  82 y.o. with history of right subclavian steal, PVD with carotid dx and left toe amputation, Afib with GI bleeding gastric /duodenal ulcers Severe COPD and necrotizing pneumonia. Admitted with a week of classic PNC/Orthopnea Mild cough no sputum/fever  CXR with chronic scarring no frank edema BNP 560 Improved back to baseline with lasix and nitro paste. No chest pain , palpitations syncope or weight gain. Echo with EF 40-45% mild to moderate MR Had only been taking lasix PRN when her legs got swollen   ROS All other systems reviewed and negative except as noted above  Past Medical History:  Diagnosis Date   Anemia 05/2014.   Atrial fibrillation ([place])    x 3 yrs   Breast cancer ([place])    S/P left mastectomy and chemotherapy 1989 remained in remission   Carotid artery occlusion    Carotid bruit    LICA 06-26% (duplex 9/48)   Degenerative arthritis of spine 2015   Chronic back pain   Hyperlipidemia    Hypertension    Meningioma ([place])    Osteoporosis    Pneumonia May 19, 2014   Stroke [facility])    CVA 2008   Subclavian steal syndrome    Ulcers of both lower extremities ([place]) 2015    Family History  Problem Relation  Age of Onset   Alzheimer's disease Mother    Dementia Mother    Hypertension Mother    Hyperlipidemia Mother    Parkinsonism Father     Social History   Socioeconomic History   Marital status: Married    Spouse name: Not on file   Number of children: 2   Years of education: Not on file   Highest education level: Not on file  Occupational History   Occupation: Retired    Fish farm manager: RETIRED  Tobacco Use   Smoking status: Former    Packs/day: 1.00    Years: 56.00    Total pack years: 56.00    Types: Cigarettes    Quit date: 08/14/2006    Years since quitting: 15.7    Passive exposure: Never   Smokeless tobacco: Never   Tobacco comments:    quit in 2008  Vaping Use   Vaping Use: Never used  Substance and Sexual Activity   Alcohol use: No    Alcohol/week: 0.0 standard drinks of alcohol   Drug use: No   Sexual activity: Never  Other Topics Concern   Not on file  Social History Narrative   Not on file   Social Determinants of Health   Financial Resource Strain: Not on file  Food Insecurity: No Food Insecurity (05/24/2022)   Hunger Vital Sign    Worried About [place] in the Last Year:  Never true    Ran Out of Food in the Last Year: Never true  Transportation Needs: No Transportation Needs (05/24/2022)   PRAPARE - Hydrologist (Medical): No    Lack of Transportation (Non-Medical): No  Physical Activity: Not on file  Stress: Not on file  Social Connections: Not on file  Intimate Partner Violence: Not At Risk (05/24/2022)   Humiliation, Afraid, Rape, and Kick questionnaire    Fear of Current or Ex-Partner: No    Emotionally Abused: No    Physically Abused: No    Sexually Abused: No    Past Surgical History:  Procedure Laterality Date   ABDOMINAL AORTOGRAM W/LOWER EXTREMITY N/A 12/19/2021   Procedure: ABDOMINAL AORTOGRAM W/LOWER EXTREMITY;  Surgeon: Serafina Mitchell, MD;  Location: [place] [facility];  Service: Cardiovascular;   Laterality: N/A;   AMPUTATION TOE Left 12/29/2021   Procedure: Incisional DEBRIDEMENT Left Foot with Amputation Left Great Toe;  Surgeon: Felipa Furnace, DPM;  Location: [place];  Service: Podiatry;  Laterality: Left;   CARDIAC CATHETERIZATION     CATARACT EXTRACTION Bilateral 2013   COLONOSCOPY  11/17/2010   Procedure: COLONOSCOPY;  Surgeon: [name], MD;  [facility];  Service: Endoscopy;  Laterality: N/A;  10:45 am   ESOPHAGOGASTRODUODENOSCOPY  11/17/2010   Procedure: ESOPHAGOGASTRODUODENOSCOPY (EGD);  Surgeon: [name], MD;  [facility];  Service: Endoscopy;  Laterality: N/A;   ESOPHAGOGASTRODUODENOSCOPY N/A 02/16/2015   Procedure: ESOPHAGOGASTRODUODENOSCOPY (EGD);  Surgeon: [name], MD;  Location: [place];  Service: Gastroenterology;  Laterality: N/A;   EXPLORATORY LAPAROTOMY  1960s   For peritonitis of undetermined cause   EYE SURGERY     LUMBAR EPIDURAL INJECTION  06-2012--06-2013   pt. states she has had 5 epidurals in 06-2012----06-2013   MASTECTOMY Left 1990   PERIPHERAL VASCULAR INTERVENTION Left 12/19/2021   Procedure: PERIPHERAL VASCULAR INTERVENTION;  Surgeon: Serafina Mitchell, MD;  Location: [place] [facility];  Service: Cardiovascular;  Laterality: Left;  SFA and PTA of POP   YAG LASER APPLICATION Right 2/72/5366   Procedure: YAG LASER APPLICATION;  Surgeon: [name], MD;  Location[facility];  Service: Ophthalmology;  Laterality: Right;  right   YAG LASER APPLICATION Left 4/40/3474   Procedure: YAG LASER APPLICATION;  Surgeon: [name], MD;  Location: [facility];  Service: Ophthalmology;  Laterality: Left;      Current Facility-Administered Medications:    acetaminophen (TYLENOL) tablet 650 mg, 650 mg, Oral, Q6H PRN **OR** acetaminophen (TYLENOL) suppository 650 mg, 650 mg, Rectal, Q6H PRN, Johnson, Clanford L, MD   apixaban (ELIQUIS) tablet 2.5 mg, 2.5 mg, Oral, BID, Johnson, Clanford L, MD, 2.5 mg at 05/24/22 2100    ascorbic acid (VITAMIN C) tablet 500 mg, 500 mg, Oral, Daily, Johnson, Clanford L, MD   bisacodyl (DULCOLAX) EC tablet 5 mg, 5 mg, Oral, Daily PRN, Johnson, Clanford L, MD   clopidogrel (PLAVIX) tablet 75 mg, 75 mg, Oral, q morning, Johnson, Clanford L, MD   collagenase (SANTYL) ointment, , Topical, Daily, Johnson, Clanford L, MD, Given at 05/24/22 1457   fentaNYL (SUBLIMAZE) injection 12.5 mcg, 12.5 mcg, Intravenous, Q2H PRN, Johnson, Clanford L, MD   furosemide (LASIX) injection 30 mg, 30 mg, Intravenous, Q12H, Johnson, Clanford L, MD, 30 mg at 05/24/22 2055   hydrALAZINE (APRESOLINE) injection 10 mg, 10 mg, Intravenous, Q4H PRN, Johnson, Clanford L, MD   ipratropium-albuterol (DUONEB) 0.5-2.5 (3) MG/3ML nebulizer solution 3 mL, 3 mL,  Nebulization, Q4H PRN, Johnson, Clanford L, MD   metoprolol tartrate (LOPRESSOR) tablet 75 mg, 75 mg, Oral, BID WC, Johnson, Clanford L, MD, 75 mg at 05/24/22 1703   multivitamin with minerals tablet 1 tablet, 1 tablet, Oral, q morning, Johnson, Clanford L, MD   ondansetron (ZOFRAN) tablet 4 mg, 4 mg, Oral, Q6H PRN **OR** ondansetron (ZOFRAN) injection 4 mg, 4 mg, Intravenous, Q6H PRN, Johnson, Clanford L, MD   oxyCODONE (Oxy IR/ROXICODONE) immediate release tablet 5 mg, 5 mg, Oral, Q6H PRN, Johnson, Clanford L, MD   pantoprazole (PROTONIX) EC tablet 40 mg, 40 mg, Oral, QAC breakfast, Johnson, Clanford L, MD   potassium chloride SA (KLOR-CON M) CR tablet 20 mEq, 20 mEq, Oral, BID, Johnson, Clanford L, MD, 20 mEq at 05/24/22 2100   pravastatin (PRAVACHOL) tablet 40 mg, 40 mg, Oral, q1800, Johnson, Clanford L, MD, 40 mg at 05/24/22 1703   traZODone (DESYREL) tablet 25 mg, 25 mg, Oral, QHS PRN, Johnson, Clanford L, MD   umeclidinium bromide (INCRUSE ELLIPTA) 62.5 MCG/ACT 1 puff, 1 puff, Inhalation, Daily, Johnson, Clanford L, MD, 1 puff at 05/24/22 1002  apixaban  2.5 mg Oral BID   ascorbic acid  500 mg Oral Daily   clopidogrel  75 mg Oral q morning   collagenase    Topical Daily   furosemide  30 mg Intravenous Q12H   metoprolol tartrate  75 mg Oral BID WC   multivitamin with minerals  1 tablet Oral q morning   pantoprazole  40 mg Oral QAC breakfast   potassium chloride  20 mEq Oral BID   pravastatin  40 mg Oral q1800   umeclidinium bromide  1 puff Inhalation Daily     Physical Exam: Blood pressure 129/74, pulse 86, temperature (!) 97.5 F (36.4 C), temperature source Oral, resp. rate 16, height 5' 1.5" (1.562 m), weight 40.9 kg, SpO2 92 %.    Chronically ill female Decreased pulses right arm JVP elevated with bilateral bruits Interstitial crackles at base SEM/MR murmur Abdomen benign No edema Post amputation with left foot wrapped  Labs:   Lab Results  Component Value Date   WBC 8.6 05/25/2022   HGB 10.3 (L) 05/25/2022   HCT 32.6 (L) 05/25/2022   MCV 95.6 05/25/2022   PLT 263 05/25/2022    Recent Labs  Lab 05/24/22 0605 05/25/22 0410  NA 135 134*  K 4.2 3.6  CL 102 97*  CO2 22 26  BUN 28* 33*  CREATININE 1.23* 1.33*  CALCIUM 9.4 9.1  PROT 8.8*  --   BILITOT 1.1  --   ALKPHOS 49  --   ALT 12  --   AST 25  --   GLUCOSE 130* 92   Lab Results  Component Value Date   CKTOTAL 456 (H) 10/12/2009   CKMB (HH) 10/12/2009    6.6 CRITICAL VALUE NOTED.  VALUE IS CONSISTENT WITH PREVIOUSLY REPORTED AND CALLED VALUE.   TROPONINI <0.03 05/20/2014    Lab Results  Component Value Date   CHOL 109 12/20/2021   CHOL 137 08/11/2010   CHOL  10/11/2009    129        ATP III CLASSIFICATION:  <200     mg/dL   Desirable  200-239  mg/dL   Borderline High  >=240    mg/dL   High          Lab Results  Component Value Date   HDL 34 (L) 12/20/2021   HDL 63 08/11/2010   HDL 67  10/11/2009   Lab Results  Component Value Date   LDLCALC 66 12/20/2021   LDLCALC 60 08/11/2010   LDLCALC  10/11/2009    45        Total Cholesterol/HDL:CHD Risk Coronary Heart Disease Risk Table                     Men   Women  1/2 Average Risk   3.4    3.3  Average Risk       5.0   4.4  2 X Average Risk   9.6   7.1  3 X Average Risk  23.4   11.0        Use the calculated Patient Ratio above and the CHD Risk Table to determine the patient's CHD Risk.        ATP III CLASSIFICATION (LDL):  <100     mg/dL   Optimal  100-129  mg/dL   Near or Above                    Optimal  130-159  mg/dL   Borderline  160-189  mg/dL   High  >190     mg/dL   Very High   Lab Results  Component Value Date   TRIG 46 12/20/2021   TRIG 68 08/11/2010   TRIG 87 10/11/2009   Lab Results  Component Value Date   CHOLHDL 3.2 12/20/2021   CHOLHDL 1.9 10/11/2009   No results found for: "LDLDIRECT"    Radiology: [facility] Chest Port 1 View  Result Date: 05/25/2022 CLINICAL DATA:  Pulmonary edema EXAM: PORTABLE CHEST 1 VIEW COMPARISON:  05/24/2022 FINDINGS: No frank interstitial edema. Right mid/lower lung and left infrahilar opacities, favoring chronic scarring. No focal consolidation. No pleural effusion or pneumothorax. The heart is normal in size. Surgical clips in the left chest wall/axilla. IMPRESSION: No frank interstitial edema. Scattered areas of chronic scarring. Electronically Signed   By: Julian Hy M.D.   On: 05/25/2022 07:29   ECHOCARDIOGRAM COMPLETE  Result Date: 05/24/2022    ECHOCARDIOGRAM REPORT   Patient Name:   KINBERLY PERRIS Date of Exam: 05/24/2022 Medical Rec #:  [id number]    Height:       61.5 in Accession #:    [id number]   Weight:       97.2 lb Date of Birth:  11/15/1940    BSA:          1.399 m Patient Age:    53 years     BP:           133/77 mmHg Patient Gender: F            HR:           85 bpm. Exam Location:  [facility] Procedure: 2D Echo, Cardiac Doppler and Color Doppler Indications:    acute diastolic chf  History:        Patient has prior history of Echocardiogram examinations, most                 recent 12/30/2021. CAD, PAD and COPD, Arrythmias:Atrial                 Fibrillation; Risk Factors:Former Smoker, Hypertension and                  Dyslipidemia.  Sonographer:    Johny Chess RDCS Referring Phys: [place] Comments: Image acquisition challenging due to respiratory motion.  IMPRESSIONS  1. Left ventricular ejection fraction, by estimation, is 40 to 45%. The left ventricle has mildly decreased function. The left ventricle demonstrates global hypokinesis. There is mild asymmetric left ventricular hypertrophy of the basal segment. Left ventricular diastolic parameters are indeterminate.  2. Right ventricular systolic function is normal. The right ventricular size is normal. There is mildly elevated pulmonary artery systolic pressure. The estimated right ventricular systolic pressure is 81.4 mmHg.  3. Left atrial size was moderately dilated.  4. The mitral valve is degenerative. Mild to moderate mitral valve regurgitation.  5. The aortic valve is tricuspid. Aortic valve regurgitation is mild. Aortic valve sclerosis/calcification is present, without any evidence of aortic stenosis.  6. The inferior vena cava is normal in size with greater than 50% respiratory variability, suggesting right atrial pressure of 3 mmHg. Comparison(s): Prior images reviewed side by side. LVEF has decreased in comparison. FINDINGS  Left Ventricle: Left ventricular ejection fraction, by estimation, is 40 to 45%. The left ventricle has mildly decreased function. The left ventricle demonstrates global hypokinesis. The left ventricular internal cavity size was normal in size. There is  mild asymmetric left ventricular hypertrophy of the basal segment. Left ventricular diastolic parameters are indeterminate. Right Ventricle: The right ventricular size is normal. No increase in right ventricular wall thickness. Right ventricular systolic function is normal. There is mildly elevated pulmonary artery systolic pressure. The tricuspid regurgitant velocity is 3.03  m/s, and with an assumed right atrial pressure of 3 mmHg, the estimated right  ventricular systolic pressure is 48.1 mmHg. Left Atrium: Left atrial size was moderately dilated. Right Atrium: Right atrial size was normal in size. Pericardium: There is no evidence of pericardial effusion. Mitral Valve: The mitral valve is degenerative in appearance. There is mild thickening of the mitral valve leaflet(s). Mild to moderate mitral valve regurgitation. Tricuspid Valve: The tricuspid valve is grossly normal. Tricuspid valve regurgitation is mild. Aortic Valve: The aortic valve is tricuspid. There is mild to moderate aortic valve annular calcification. Aortic valve regurgitation is mild. Aortic regurgitation PHT measures 499 msec. Aortic valve sclerosis/calcification is present, without any evidence of aortic stenosis. Pulmonic Valve: The pulmonic valve was grossly normal. Pulmonic valve regurgitation is trivial. Aorta: The aortic root is normal in size and structure. Venous: The inferior vena cava is normal in size with greater than 50% respiratory variability, suggesting right atrial pressure of 3 mmHg. IAS/Shunts: No atrial level shunt detected by color flow Doppler.  LEFT VENTRICLE PLAX 2D LVIDd:         4.40 cm LVIDs:         3.40 cm LV PW:         0.90 cm LV IVS:        1.10 cm LVOT diam:     1.90 cm LV SV:         30 LV SV Index:   21 LVOT Area:     2.84 cm  LV Volumes (MOD) LV vol d, MOD A2C: 53.4 ml LV vol d, MOD A4C: 50.2 ml LV vol s, MOD A2C: 31.2 ml LV vol s, MOD A4C: 29.2 ml LV SV MOD A2C:     22.2 ml LV SV MOD A4C:     50.2 ml LV SV MOD BP:      23.2 ml RIGHT VENTRICLE             IVC RV Basal diam:  2.50 cm     IVC diam: 1.80 cm RV S prime:  13.10 cm/s TAPSE (M-mode): 2.2 cm LEFT ATRIUM             Index        RIGHT ATRIUM           Index LA diam:        4.20 cm 3.00 cm/m   RA Area:     14.50 cm LA Vol (A2C):   43.0 ml 30.75 ml/m  RA Volume:   32.40 ml  23.17 ml/m LA Vol (A4C):   60.8 ml 43.47 ml/m LA Biplane Vol: 53.0 ml 37.90 ml/m  AORTIC VALVE LVOT Vmax:   60.40 cm/s LVOT  Vmean:  39.400 cm/s LVOT VTI:    0.106 m AI PHT:      499 msec  AORTA Ao Root diam: 3.00 cm Ao Asc diam:  3.10 cm MITRAL VALVE               TRICUSPID VALVE MV Area (PHT): 5.23 cm    TR Peak grad:   36.7 mmHg MV Decel Time: 145 msec    TR Vmax:        303.00 cm/s MV E velocity: 85.30 cm/s MV A velocity: 37.70 cm/s  SHUNTS MV E/A ratio:  2.26        Systemic VTI:  0.11 m                            Systemic Diam: 1.90 cm Rozann Lesches MD Electronically signed by Rozann Lesches MD Signature Date/Time: 05/24/2022/4:21:15 PM    Final    DG Chest Portable 1 View  Result Date: 05/24/2022 CLINICAL DATA:  Shortness of breath when laying down EXAM: PORTABLE CHEST 1 VIEW COMPARISON:  03/06/2022 FINDINGS: Diffuse interstitial opacity above prior baseline including Ker[place] lines. Linear scar-like density in the right lower lung. Borderline heart size. Normal mediastinal contours. Postoperative left axilla. IMPRESSION: CHF. Electronically Signed   By: Jorje Guild M.D.   On: 05/24/2022 06:24    EKG: afib nonspecific ST changes    ASSESSMENT AND PLAN:   Acute systolic CHF: overall she is not volume overloaded Given age, frailty and lack of chest pain no ischemic w/u and negative troponin x 2.  Start low dose ARB continue lasix change to 20 mg daily on d/c  Afib:  chronic on continue lopressor for rate control resume low dose eliquis for anticoagulation PVD:  right subclavian steal and left toe amputation with moderate LICA stenosis  and prior stroke on plavix  COPD:  sats ok CXR no acute finding respiratory panel negative continue oxygen and home nebs   Ok to d/c in am likely  Signed: Jenkins Rouge 05/25/2022, 7:55 AM

## 2022-05-25 NOTE — Care Management Important Message (Signed)
Important Message  Patient Details  Name: [name] MRN: [id number] Date of Birth: Oct 08, 1940   Medicare Important Message Given:  Yes     Tommy Medal 05/25/2022, 11:25 AM

## 2022-05-25 NOTE — Progress Notes (Signed)
Upon assessment pt. Did not have [place] in nose. Pt. Stated that it was blowing too hard. O2 set to 5L. 2.5L charted in EPIC on previous shift. Oxygen saturation was 98% on room air. Did not put [place] back in nose. Will continue to monitor.

## 2022-05-25 NOTE — Progress Notes (Signed)
PROGRESS NOTE   [name]  EHO:[id number] DOB: 05-20-1940 DOA: 05/24/2022 PCP: Celene Squibb, MD   Chief Complaint  Patient presents with   Shortness of Breath   Level of care: Telemetry  Brief Admission History:  82 year old female with 82 year old female with hypertension, hyperlipidemia, prior CVA,Type 2 diabetes mellitus with neuropathy, PAD s/p vascular surgery, Moderate protein calorie malnutrition, atrial fibrillation, s/p left first ray amputation 9/23 by Dr. Boneta Lucks who presented with shortness of breath when lying recumbent.  She was also having chest tightness and numbness to bilateral arms for past hours prior to arrival.  She denies cough,  fever and chills. She denies chest pain.  Her SOB was worse with lying recumbent.  She has COPD but quit smoking in 2008.  She says that she has not been taking her furosemide regularly because she was only taking it when she had edema in her legs and that has not been an issue for her recently.     In ED she was noted to have pulmonary edema on CXR and elevated BNP and she was given IV lasix and required 2L/min nasal cannula oxygen.  Admission requested for new findings of CHF.      Assessment and Plan:  Acute HFrEF  -Pt responding well to IV lasix -TTE with new finding of decrease LVEF to 40-45%  -appreciate cardiology recs -continue IV lasix today -DC home on oral lasix 20 mg daily  Hyperlipidemia -resumed pravastatin daily   PAD - severe - s/p revascularization to LLE by Dr. Trula Slade - continue plavix 75 mg daily   Hypertensive urgency -BPs much better controlled on home meds -metoprolol 75 mg BID resumed from home  GERD  -continue daily pantoprazole 40 mg   Atrial fibrillation with acquired thrombophilia -metoprolol BID for rate control -apixaban for full anticoagulation   Left ankle wound -healing wound -continue topical wound care with chemical debridement -follow up with podiatrist as scheduled    DVT  prophylaxis: apixaban  Code Status: Full  Family Communication: husband  Disposition: anticipate DC home 2/17  Remains inpatient appropriate because: IV furosemide    Consultants:  cardiology Procedures:  TTE: LVEF 40-45% with mild to moderate MR Antimicrobials:    Subjective: Pt reports that she is able to lie back more without severe SOB and starting to breathe better.    Objective: Vitals:   05/24/22 2108 05/25/22 0443 05/25/22 0802 05/25/22 0825  BP: 139/81 129/74  139/77  Pulse: 82 86  86  Resp: 16 16  18   Temp: (!) 97.5 F (36.4 C) (!) 97.5 F (36.4 C)  97.7 F (36.5 C)  TempSrc: Oral Oral  Oral  SpO2: 99% 92% 97%   Weight:  40.9 kg    Height:        Intake/Output Summary (Last 24 hours) at 05/25/2022 1013 Last data filed at 05/24/2022 2300 Gross per 24 hour  Intake 240 ml  Output 600 ml  Net -360 ml   Filed Weights   05/24/22 0549 05/25/22 0443  Weight: 44.1 kg 40.9 kg   Examination:  General exam: frail, thin female, awake, alert, Appears calm and comfortable  Respiratory system: rare expiratory crackles at bases heard posteriorly Cardiovascular system: normal S1 & S2 heard. No JVD, murmurs, rubs, gallops or clicks. No pedal edema. Gastrointestinal system: Abdomen is nondistended, soft and nontender. No organomegaly or masses felt. Normal bowel sounds heard. Central nervous system: Alert and oriented. No focal neurological deficits. Extremities: Symmetric 5 x 5  power. Skin: No rashes, lesions or ulcers. Psychiatry: Judgement and insight appear normal. Mood & affect appropriate.   Data Reviewed: I have personally reviewed following labs and imaging studies  CBC: Recent Labs  Lab 05/24/22 0605 05/25/22 0410  WBC 9.8 8.6  HGB 11.5* 10.3*  HCT 37.3 32.6*  MCV 98.9 95.6  PLT 300 696    Basic Metabolic Panel: Recent Labs  Lab 05/24/22 0605 05/25/22 0410  NA 135 134*  K 4.2 3.6  CL 102 97*  CO2 22 26  GLUCOSE 130* 92  BUN 28* 33*   CREATININE 1.23* 1.33*  CALCIUM 9.4 9.1  MG  --  1.9    CBG: No results for input(s): "GLUCAP" in the last 168 hours.  Recent Results (from the past 240 hour(s))  Resp panel by RT-PCR (RSV, Flu A&B, Covid) Anterior Nasal Swab     Status: None   Collection Time: 05/24/22  6:05 AM   Specimen: Anterior Nasal Swab  Result Value Ref Range Status   SARS Coronavirus 2 by RT PCR NEGATIVE NEGATIVE Final    Comment: (NOTE) SARS-CoV-2 target nucleic acids are NOT DETECTED.  The SARS-CoV-2 RNA is generally detectable in upper respiratory specimens during the acute phase of infection. The lowest concentration of SARS-CoV-2 viral copies this assay can detect is 138 copies/mL. A negative result does not preclude SARS-Cov-2 infection and should not be used as the sole basis for treatment or other patient management decisions. A negative result may occur with  improper specimen collection/handling, submission of specimen other than nasopharyngeal swab, presence of viral mutation(s) within the areas targeted by this assay, and inadequate number of viral copies(<138 copies/mL). A negative result must be combined with clinical observations, patient history, and epidemiological information. The expected result is Negative.  Fact Sheet for Patients:  EntrepreneurPulse.com.au  Fact Sheet for Healthcare Providers:  IncredibleEmployment.be  This test is no t yet approved or cleared by the [place] FDA and  has been authorized for detection and/or diagnosis of SARS-CoV-2 by FDA under an Emergency Use Authorization (EUA). This EUA will remain  in effect (meaning this test can be used) for the duration of the COVID-19 declaration under Section 564(b)(1) of the Act, 21 U.S.C.section 360bbb-3(b)(1), unless the authorization is terminated  or revoked sooner.       Influenza A by PCR NEGATIVE NEGATIVE Final   Influenza B by PCR NEGATIVE NEGATIVE Final     Comment: (NOTE) The Xpert Xpress SARS-CoV-2/FLU/RSV plus assay is intended as an aid in the diagnosis of influenza from Nasopharyngeal swab specimens and should not be used as a sole basis for treatment. Nasal washings and aspirates are unacceptable for Xpert Xpress SARS-CoV-2/FLU/RSV testing.  Fact Sheet for Patients: EntrepreneurPulse.com.au  Fact Sheet for Healthcare Providers: IncredibleEmployment.be  This test is not yet approved or cleared by the [place] FDA and has been authorized for detection and/or diagnosis of SARS-CoV-2 by FDA under an Emergency Use Authorization (EUA). This EUA will remain in effect (meaning this test can be used) for the duration of the COVID-19 declaration under Section 564(b)(1) of the Act, 21 U.S.C. section 360bbb-3(b)(1), unless the authorization is terminated or revoked.     Resp Syncytial Virus by PCR NEGATIVE NEGATIVE Final    Comment: (NOTE) Fact Sheet for Patients: EntrepreneurPulse.com.au  Fact Sheet for Healthcare Providers: IncredibleEmployment.be  This test is not yet approved or cleared by the [place] FDA and has been authorized for detection and/or diagnosis of SARS-CoV-2 by FDA under an  Emergency Use Authorization (EUA). This EUA will remain in effect (meaning this test can be used) for the duration of the COVID-19 declaration under Section 564(b)(1) of the Act, 21 U.S.C. section 360bbb-3(b)(1), unless the authorization is terminated or revoked.  Performed at [facility], [address], [place], [place] [address]      Radiology Studies: [facility] Chest [facility] 1 View  Result Date: 05/25/2022 CLINICAL DATA:  Pulmonary edema EXAM: PORTABLE CHEST 1 VIEW COMPARISON:  05/24/2022 FINDINGS: No frank interstitial edema. Right mid/lower lung and left infrahilar opacities, favoring chronic scarring. No focal consolidation. No pleural effusion or pneumothorax. The  heart is normal in size. Surgical clips in the left chest wall/axilla. IMPRESSION: No frank interstitial edema. Scattered areas of chronic scarring. Electronically Signed   By: Julian Hy M.D.   On: 05/25/2022 07:29   ECHOCARDIOGRAM COMPLETE  Result Date: 05/24/2022    ECHOCARDIOGRAM REPORT   Patient Name:   [name] Date of Exam: 05/24/2022 Medical Rec #:  [id number]    Height:       61.5 in Accession #:    [id number]   Weight:       97.2 lb Date of Birth:  1940-11-02    BSA:          1.399 m Patient Age:    33 years     BP:           133/77 mmHg Patient Gender: F            HR:           85 bpm. Exam Location:  [facility] Procedure: 2D Echo, Cardiac Doppler and Color Doppler Indications:    acute diastolic chf  History:        Patient has prior history of Echocardiogram examinations, most                 recent 12/30/2021. CAD, PAD and COPD, Arrythmias:Atrial                 Fibrillation; Risk Factors:Former Smoker, Hypertension and                 Dyslipidemia.  Sonographer:    Johny Chess RDCS Referring Phys: [place] Comments: Image acquisition challenging due to respiratory motion. IMPRESSIONS  1. Left ventricular ejection fraction, by estimation, is 40 to 45%. The left ventricle has mildly decreased function. The left ventricle demonstrates global hypokinesis. There is mild asymmetric left ventricular hypertrophy of the basal segment. Left ventricular diastolic parameters are indeterminate.  2. Right ventricular systolic function is normal. The right ventricular size is normal. There is mildly elevated pulmonary artery systolic pressure. The estimated right ventricular systolic pressure is 51.7 mmHg.  3. Left atrial size was moderately dilated.  4. The mitral valve is degenerative. Mild to moderate mitral valve regurgitation.  5. The aortic valve is tricuspid. Aortic valve regurgitation is mild. Aortic valve sclerosis/calcification is present, without any evidence  of aortic stenosis.  6. The inferior vena cava is normal in size with greater than 50% respiratory variability, suggesting right atrial pressure of 3 mmHg. Comparison(s): Prior images reviewed side by side. LVEF has decreased in comparison. FINDINGS  Left Ventricle: Left ventricular ejection fraction, by estimation, is 40 to 45%. The left ventricle has mildly decreased function. The left ventricle demonstrates global hypokinesis. The left ventricular internal cavity size was normal in size. There is  mild asymmetric left ventricular hypertrophy of the basal segment. Left  ventricular diastolic parameters are indeterminate. Right Ventricle: The right ventricular size is normal. No increase in right ventricular wall thickness. Right ventricular systolic function is normal. There is mildly elevated pulmonary artery systolic pressure. The tricuspid regurgitant velocity is 3.03  m/s, and with an assumed right atrial pressure of 3 mmHg, the estimated right ventricular systolic pressure is 32.6 mmHg. Left Atrium: Left atrial size was moderately dilated. Right Atrium: Right atrial size was normal in size. Pericardium: There is no evidence of pericardial effusion. Mitral Valve: The mitral valve is degenerative in appearance. There is mild thickening of the mitral valve leaflet(s). Mild to moderate mitral valve regurgitation. Tricuspid Valve: The tricuspid valve is grossly normal. Tricuspid valve regurgitation is mild. Aortic Valve: The aortic valve is tricuspid. There is mild to moderate aortic valve annular calcification. Aortic valve regurgitation is mild. Aortic regurgitation PHT measures 499 msec. Aortic valve sclerosis/calcification is present, without any evidence of aortic stenosis. Pulmonic Valve: The pulmonic valve was grossly normal. Pulmonic valve regurgitation is trivial. Aorta: The aortic root is normal in size and structure. Venous: The inferior vena cava is normal in size with greater than 50% respiratory  variability, suggesting right atrial pressure of 3 mmHg. IAS/Shunts: No atrial level shunt detected by color flow Doppler.  LEFT VENTRICLE PLAX 2D LVIDd:         4.40 cm LVIDs:         3.40 cm LV PW:         0.90 cm LV IVS:        1.10 cm LVOT diam:     1.90 cm LV SV:         30 LV SV Index:   21 LVOT Area:     2.84 cm  LV Volumes (MOD) LV vol d, MOD A2C: 53.4 ml LV vol d, MOD A4C: 50.2 ml LV vol s, MOD A2C: 31.2 ml LV vol s, MOD A4C: 29.2 ml LV SV MOD A2C:     22.2 ml LV SV MOD A4C:     50.2 ml LV SV MOD BP:      23.2 ml RIGHT VENTRICLE             IVC RV Basal diam:  2.50 cm     IVC diam: 1.80 cm RV S prime:     13.10 cm/s TAPSE (M-mode): 2.2 cm LEFT ATRIUM             Index        RIGHT ATRIUM           Index LA diam:        4.20 cm 3.00 cm/m   RA Area:     14.50 cm LA Vol (A2C):   43.0 ml 30.75 ml/m  RA Volume:   32.40 ml  23.17 ml/m LA Vol (A4C):   60.8 ml 43.47 ml/m LA Biplane Vol: 53.0 ml 37.90 ml/m  AORTIC VALVE LVOT Vmax:   60.40 cm/s LVOT Vmean:  39.400 cm/s LVOT VTI:    0.106 m AI PHT:      499 msec  AORTA Ao Root diam: 3.00 cm Ao Asc diam:  3.10 cm MITRAL VALVE               TRICUSPID VALVE MV Area (PHT): 5.23 cm    TR Peak grad:   36.7 mmHg MV Decel Time: 145 msec    TR Vmax:        303.00 cm/s MV E velocity: 85.30 cm/s MV A  velocity: 37.70 cm/s  SHUNTS MV E/A ratio:  2.26        Systemic VTI:  0.11 m                            Systemic Diam: 1.90 cm Rozann Lesches MD Electronically signed by Rozann Lesches MD Signature Date/Time: 05/24/2022/4:21:15 PM    Final    DG Chest Portable 1 View  Result Date: 05/24/2022 CLINICAL DATA:  Shortness of breath when laying down EXAM: PORTABLE CHEST 1 VIEW COMPARISON:  03/06/2022 FINDINGS: Diffuse interstitial opacity above prior baseline including Kerley lines. Linear scar-like density in the right lower lung. Borderline heart size. Normal mediastinal contours. Postoperative left axilla. IMPRESSION: CHF. Electronically Signed   By: Jorje Guild M.D.    On: 05/24/2022 06:24    Scheduled Meds:  apixaban  2.5 mg Oral BID   ascorbic acid  500 mg Oral Daily   clopidogrel  75 mg Oral q morning   collagenase   Topical Daily   furosemide  30 mg Intravenous Q12H   metoprolol tartrate  75 mg Oral BID WC   multivitamin with minerals  1 tablet Oral q morning   pantoprazole  40 mg Oral QAC breakfast   potassium chloride  20 mEq Oral BID   pravastatin  40 mg Oral q1800   umeclidinium bromide  1 puff Inhalation Daily   Continuous Infusions:   LOS: 1 day   Time spent: 36 mins  Shelva Hetzer Wynetta Emery, MD How to contact the [facility] Attending or Consulting provider [place] or covering provider during after hours [place], for this patient?  Check the care team in [facility] and look for a) attending/consulting [facility] provider listed and b) the [facility] team listed Log into [email or web address] and use [place]'s universal password to access. If you do not have the password, please contact the hospital operator. Locate the [facility] provider you are looking for under [facility] and page to a number that you can be directly reached. If you still have difficulty reaching the provider, please page the [facility] (Director on Call) for the Hospitalists listed on amion for assistance.  05/25/2022, 10:13 AM

## 2022-05-26 DIAGNOSIS — E785 Hyperlipidemia, unspecified: Secondary | ICD-10-CM | POA: Diagnosis not present

## 2022-05-26 DIAGNOSIS — R531 Weakness: Secondary | ICD-10-CM | POA: Diagnosis not present

## 2022-05-26 DIAGNOSIS — I5031 Acute diastolic (congestive) heart failure: Secondary | ICD-10-CM | POA: Diagnosis not present

## 2022-05-26 DIAGNOSIS — I779 Disorder of arteries and arterioles, unspecified: Secondary | ICD-10-CM | POA: Diagnosis not present

## 2022-05-26 MED ORDER — FUROSEMIDE 10 MG/ML IJ SOLN
40.0000 mg | Freq: Every day | INTRAMUSCULAR | Status: DC
  Administered 2022-05-26: 40 mg via INTRAVENOUS

## 2022-05-26 MED ORDER — FUROSEMIDE 20 MG PO TABS: 20.0000 mg | ORAL_TABLET | Freq: Every day | ORAL | 2 refills | Status: DC

## 2022-05-26 MED ORDER — POTASSIUM CHLORIDE CRYS ER 10 MEQ PO TBCR: 10.0000 meq | EXTENDED_RELEASE_TABLET | Freq: Every day | ORAL | 2 refills | Status: DC

## 2022-05-26 NOTE — TOC Transition Note (Signed)
Patient to discharge with [facility], HHPT/OT today. Notified MD for [facility] orders.  Spoke with Judson Roch with [facility] who will follow post discharge for [facility] services.   Awaiting HH orders.

## 2022-05-26 NOTE — Progress Notes (Signed)
   05/26/22 0800  ReDS Vest / Clip  Station Marker A  Ruler Value 25  ReDS Value Range 36 - 40  ReDS Actual Value 35

## 2022-05-26 NOTE — Discharge Summary (Signed)
Physician Discharge Summary  [name] ZOX:[id number] DOB: [birth year] DOA: 05/24/2022  PCP: Celene Squibb, MD  Admit date: 05/24/2022 Discharge date: 05/26/2022  Admitted From:  Home  Disposition: Home   Recommendations for Outpatient Follow-up:  Follow up with PCP in 1 weeks Follow up with cardiologist on 3/27 as scheduled  Please obtain BMP in 1-2 weeks to follow electrolytes  Discharge Condition: STABLE   CODE STATUS: FULL DIET: 2 gm sodium restricted heart healthy   Brief Hospitalization Summary: Please see all hospital notes, images, labs for full details of the hospitalization. ADMISSION HPI:  82 year old female with 82 year old female with hypertension, hyperlipidemia, prior CVA,Type 2 diabetes mellitus with neuropathy, PAD s/p vascular surgery, Moderate protein calorie malnutrition, atrial fibrillation, s/p left first ray amputation 9/23 by Dr. Boneta Lucks who presented with shortness of breath when lying recumbent.  She was also having chest tightness and numbness to bilateral arms for past hours prior to arrival.  She denies cough,  fever and chills. She denies chest pain.  Her SOB was worse with lying recumbent.  She has COPD but quit smoking in 2008.  She says that she has not been taking her furosemide regularly because she was only taking it when she had edema in her legs and that has not been an issue for her recently.     In ED she was noted to have pulmonary edema on CXR and elevated BNP and she was given IV lasix and required 2L/min nasal cannula oxygen.  Admission requested for new findings of CHF.     HOSPITAL COURSE BY PROBLEM   Acute HFrEF  -Pt responded well to IV lasix -TTE with new finding of decrease LVEF to 40-45%  -appreciate cardiology recs -given 40 mg IV lasix today -DC home on oral lasix 20 mg daily per [name]  -pt has outpatient appt with [name] on 3/27 -follow up with PCP in 1-2 weeks for labs and weight  -advised 2 gm sodium restricted diet    Hyperlipidemia -resumed pravastatin daily    PAD - severe - s/p revascularization to LLE by Dr. Trula Slade - continue plavix 75 mg daily    Hypertensive urgency -BPs much better controlled on home meds -metoprolol 75 mg BID resumed from home   GERD  -continue daily pantoprazole 40 mg    Atrial fibrillation with acquired thrombophilia -metoprolol BID for rate control -apixaban for full anticoagulation    Left ankle wound -healing wound -continue topical wound care with chemical debridement -follow up with podiatrist as scheduled     Discharge Diagnoses:  Principal Problem:   Acute heart failure with preserved ejection fraction (HFpEF) ([place]) Active Problems:   Hyperlipidemia   Essential hypertension   Atrial fibrillation (HCC)   Wound of ankle   Protein-calorie malnutrition, moderate (HCC)   Generalized weakness   Smoking history   Carotid artery disease (HCC)   CAD in native artery   Chronic back pain   Coagulopathy (HCC)   COPD, moderate (HCC)   PAD (peripheral artery disease) (HCC)   GERD (gastroesophageal reflux disease)   Discharge Instructions:  Allergies as of 05/26/2022       Reactions   Iodine Rash, Other (See Comments)   BETADINE Rash/burning, blisters on skin. Burns skin   Iohexol Rash, Other (See Comments)   Blisters; PT NEEDS 13-HOUR PREP   Sulfa Antibiotics Nausea And Vomiting   Petroleum Gauze Non-woven 3x9" [wound Dressings] Other (See Comments)   BURNING SENSATION, RED SKIN  Povidone-iodine Other (See Comments)   Burns skin   Tape Itching, Rash, Other (See Comments)   "DO NOT USE ADHESIVE TAPE" Not even a Band Aid" NO PAPER TAPE Burns skin Skin is very very thin        Medication List     STOP taking these medications    amoxicillin-clavulanate 875-125 MG tablet Commonly known as: AUGMENTIN   Incruse Ellipta 62.5 MCG/ACT Aepb Generic drug: umeclidinium bromide       TAKE these medications    acetaminophen 325 MG  tablet Commonly known as: TYLENOL Take 2 tablets (650 mg total) by mouth every 6 (six) hours as needed for mild pain (or Fever >/= 101).   ascorbic acid 500 MG tablet Commonly known as: VITAMIN C Take 500 mg by mouth daily.   CALCIUM PO Take 1,200 mg by mouth daily.   clopidogrel 75 MG tablet Commonly known as: PLAVIX TAKE 1 TABLET EVERY MORNING   denosumab 60 MG/ML Sosy injection Commonly known as: PROLIA Inject 60 mg into the skin every 6 (six) months.   Eliquis 2.5 MG Tabs tablet Generic drug: apixaban TAKE 1 TABLET TWICE A DAY   furosemide 20 MG tablet Commonly known as: LASIX Take 1 tablet (20 mg total) by mouth daily. Only as needed PRN Start taking on: May 27, 2022   IRON PO Take 65 mg by mouth daily at 6 (six) AM.   MAGNESIUM OXIDE PO Take 500 mg by mouth every morning.   metoprolol tartrate 25 MG tablet Commonly known as: LOPRESSOR TAKE 3 TABLETS TWICE A DAY   multivitamin with minerals Tabs tablet Take 1 tablet by mouth every morning.   pantoprazole 40 MG tablet Commonly known as: PROTONIX TAKE 1 TABLET DAILY   potassium chloride 10 MEQ tablet Commonly known as: KLOR-CON M Take 1 tablet (10 mEq total) by mouth daily.   pravastatin 40 MG tablet Commonly known as: PRAVACHOL Take 1 tablet (40 mg total) by mouth daily.   Santyl 250 UNIT/GM ointment Generic drug: collagenase APPLY TO AFFECTED AREAS ONCE DAILY.   tiotropium 18 MCG inhalation capsule Commonly known as: SPIRIVA Place 18 mcg into inhaler and inhale daily.        Follow-up Information     Celene Squibb, MD. Schedule an appointment as soon as possible for a visit in 1 week(s).   Specialty: Internal Medicine Why: Hospital Follow Up Contact information: [address] Quintella Reichert [facility] [address] [phone]         Josue Hector, MD. Go on 07/04/2022.   Specialty: Cardiology Why: Hospital Follow Up Contact information: [address] [place]  [address] [phone]                Allergies  Allergen Reactions   Iodine Rash and Other (See Comments)    BETADINE Rash/burning, blisters on skin. Burns skin   Iohexol Rash and Other (See Comments)    Blisters; PT NEEDS 13-HOUR PREP    Sulfa Antibiotics Nausea And Vomiting   Petroleum Gauze Non-Woven 3x9" [Wound Dressings] Other (See Comments)    BURNING SENSATION, RED SKIN   Povidone-Iodine Other (See Comments)    Burns skin   Tape Itching, Rash and Other (See Comments)    "DO NOT USE ADHESIVE TAPE" Not even a Band Aid" NO PAPER TAPE Burns skin Skin is very very thin   Allergies as of 05/26/2022       Reactions   Iodine Rash, Other (See  Comments)   BETADINE Rash/burning, blisters on skin. Burns skin   Iohexol Rash, Other (See Comments)   Blisters; PT NEEDS 13-HOUR PREP   Sulfa Antibiotics Nausea And Vomiting   Petroleum Gauze Non-woven 3x9" [wound Dressings] Other (See Comments)   BURNING SENSATION, RED SKIN   Povidone-iodine Other (See Comments)   Burns skin   Tape Itching, Rash, Other (See Comments)   "DO NOT USE ADHESIVE TAPE" Not even a Band Aid" NO PAPER TAPE Burns skin Skin is very very thin        Medication List     STOP taking these medications    amoxicillin-clavulanate 875-125 MG tablet Commonly known as: AUGMENTIN   Incruse Ellipta 62.5 MCG/ACT Aepb Generic drug: umeclidinium bromide       TAKE these medications    acetaminophen 325 MG tablet Commonly known as: TYLENOL Take 2 tablets (650 mg total) by mouth every 6 (six) hours as needed for mild pain (or Fever >/= 101).   ascorbic acid 500 MG tablet Commonly known as: VITAMIN C Take 500 mg by mouth daily.   CALCIUM PO Take 1,200 mg by mouth daily.   clopidogrel 75 MG tablet Commonly known as: PLAVIX TAKE 1 TABLET EVERY MORNING   denosumab 60 MG/ML Sosy injection Commonly known as: PROLIA Inject 60 mg into the skin every 6 (six) months.   Eliquis 2.5 MG Tabs  tablet Generic drug: apixaban TAKE 1 TABLET TWICE A DAY   furosemide 20 MG tablet Commonly known as: LASIX Take 1 tablet (20 mg total) by mouth daily. Only as needed PRN Start taking on: May 27, 2022   IRON PO Take 65 mg by mouth daily at 6 (six) AM.   MAGNESIUM OXIDE PO Take 500 mg by mouth every morning.   metoprolol tartrate 25 MG tablet Commonly known as: LOPRESSOR TAKE 3 TABLETS TWICE A DAY   multivitamin with minerals Tabs tablet Take 1 tablet by mouth every morning.   pantoprazole 40 MG tablet Commonly known as: PROTONIX TAKE 1 TABLET DAILY   potassium chloride 10 MEQ tablet Commonly known as: KLOR-CON M Take 1 tablet (10 mEq total) by mouth daily.   pravastatin 40 MG tablet Commonly known as: PRAVACHOL Take 1 tablet (40 mg total) by mouth daily.   Santyl 250 UNIT/GM ointment Generic drug: collagenase APPLY TO AFFECTED AREAS ONCE DAILY.   tiotropium 18 MCG inhalation capsule Commonly known as: SPIRIVA Place 18 mcg into inhaler and inhale daily.        Procedures/Studies: DG Chest Port 1 View  Result Date: 05/25/2022 CLINICAL DATA:  Pulmonary edema EXAM: PORTABLE CHEST 1 VIEW COMPARISON:  05/24/2022 FINDINGS: No frank interstitial edema. Right mid/lower lung and left infrahilar opacities, favoring chronic scarring. No focal consolidation. No pleural effusion or pneumothorax. The heart is normal in size. Surgical clips in the left chest wall/axilla. IMPRESSION: No frank interstitial edema. Scattered areas of chronic scarring. Electronically Signed   By: Julian Hy M.D.   On: 05/25/2022 07:29   ECHOCARDIOGRAM COMPLETE  Result Date: 05/24/2022    ECHOCARDIOGRAM REPORT   Patient Name:   MELODY CIRRINCIONE Date of Exam: 05/24/2022 Medical Rec #:  [id number]    Height:       61.5 in Accession #:    [id number]   Weight:       97.2 lb Date of Birth:  Aug 01, 1940    BSA:          1.399 m Patient Age:    29 years  BP:           133/77 mmHg Patient Gender: F             HR:           85 bpm. Exam Location:  [facility] Procedure: 2D Echo, Cardiac Doppler and Color Doppler Indications:    acute diastolic chf  History:        Patient has prior history of Echocardiogram examinations, most                 recent 12/30/2021. CAD, PAD and COPD, Arrythmias:Atrial                 Fibrillation; Risk Factors:Former Smoker, Hypertension and                 Dyslipidemia.  Sonographer:    Johny Chess RDCS Referring Phys: [place] Comments: Image acquisition challenging due to respiratory motion. IMPRESSIONS  1. Left ventricular ejection fraction, by estimation, is 40 to 45%. The left ventricle has mildly decreased function. The left ventricle demonstrates global hypokinesis. There is mild asymmetric left ventricular hypertrophy of the basal segment. Left ventricular diastolic parameters are indeterminate.  2. Right ventricular systolic function is normal. The right ventricular size is normal. There is mildly elevated pulmonary artery systolic pressure. The estimated right ventricular systolic pressure is 29.9 mmHg.  3. Left atrial size was moderately dilated.  4. The mitral valve is degenerative. Mild to moderate mitral valve regurgitation.  5. The aortic valve is tricuspid. Aortic valve regurgitation is mild. Aortic valve sclerosis/calcification is present, without any evidence of aortic stenosis.  6. The inferior vena cava is normal in size with greater than 50% respiratory variability, suggesting right atrial pressure of 3 mmHg. Comparison(s): Prior images reviewed side by side. LVEF has decreased in comparison. FINDINGS  Left Ventricle: Left ventricular ejection fraction, by estimation, is 40 to 45%. The left ventricle has mildly decreased function. The left ventricle demonstrates global hypokinesis. The left ventricular internal cavity size was normal in size. There is  mild asymmetric left ventricular hypertrophy of the basal segment. Left ventricular  diastolic parameters are indeterminate. Right Ventricle: The right ventricular size is normal. No increase in right ventricular wall thickness. Right ventricular systolic function is normal. There is mildly elevated pulmonary artery systolic pressure. The tricuspid regurgitant velocity is 3.03  m/s, and with an assumed right atrial pressure of 3 mmHg, the estimated right ventricular systolic pressure is 24.2 mmHg. Left Atrium: Left atrial size was moderately dilated. Right Atrium: Right atrial size was normal in size. Pericardium: There is no evidence of pericardial effusion. Mitral Valve: The mitral valve is degenerative in appearance. There is mild thickening of the mitral valve leaflet(s). Mild to moderate mitral valve regurgitation. Tricuspid Valve: The tricuspid valve is grossly normal. Tricuspid valve regurgitation is mild. Aortic Valve: The aortic valve is tricuspid. There is mild to moderate aortic valve annular calcification. Aortic valve regurgitation is mild. Aortic regurgitation PHT measures 499 msec. Aortic valve sclerosis/calcification is present, without any evidence of aortic stenosis. Pulmonic Valve: The pulmonic valve was grossly normal. Pulmonic valve regurgitation is trivial. Aorta: The aortic root is normal in size and structure. Venous: The inferior vena cava is normal in size with greater than 50% respiratory variability, suggesting right atrial pressure of 3 mmHg. IAS/Shunts: No atrial level shunt detected by color flow Doppler.  LEFT VENTRICLE PLAX 2D LVIDd:         4.40 cm  LVIDs:         3.40 cm LV PW:         0.90 cm LV IVS:        1.10 cm LVOT diam:     1.90 cm LV SV:         30 LV SV Index:   21 LVOT Area:     2.84 cm  LV Volumes (MOD) LV vol d, MOD A2C: 53.4 ml LV vol d, MOD A4C: 50.2 ml LV vol s, MOD A2C: 31.2 ml LV vol s, MOD A4C: 29.2 ml LV SV MOD A2C:     22.2 ml LV SV MOD A4C:     50.2 ml LV SV MOD BP:      23.2 ml RIGHT VENTRICLE             IVC RV Basal diam:  2.50 cm     IVC  diam: 1.80 cm RV S prime:     13.10 cm/s TAPSE (M-mode): 2.2 cm LEFT ATRIUM             Index        RIGHT ATRIUM           Index LA diam:        4.20 cm 3.00 cm/m   RA Area:     14.50 cm LA Vol (A2C):   43.0 ml 30.75 ml/m  RA Volume:   32.40 ml  23.17 ml/m LA Vol (A4C):   60.8 ml 43.47 ml/m LA Biplane Vol: 53.0 ml 37.90 ml/m  AORTIC VALVE LVOT Vmax:   60.40 cm/s LVOT Vmean:  39.400 cm/s LVOT VTI:    0.106 m AI PHT:      499 msec  AORTA Ao Root diam: 3.00 cm Ao Asc diam:  3.10 cm MITRAL VALVE               TRICUSPID VALVE MV Area (PHT): 5.23 cm    TR Peak grad:   36.7 mmHg MV Decel Time: 145 msec    TR Vmax:        303.00 cm/s MV E velocity: 85.30 cm/s MV A velocity: 37.70 cm/s  SHUNTS MV E/A ratio:  2.26        Systemic VTI:  0.11 m                            Systemic Diam: 1.90 cm Rozann Lesches MD Electronically signed by Rozann Lesches MD Signature Date/Time: 05/24/2022/4:21:15 PM    Final    DG Chest Portable 1 View  Result Date: 05/24/2022 CLINICAL DATA:  Shortness of breath when laying down EXAM: PORTABLE CHEST 1 VIEW COMPARISON:  03/06/2022 FINDINGS: Diffuse interstitial opacity above prior baseline including Kerley lines. Linear scar-like density in the right lower lung. Borderline heart size. Normal mediastinal contours. Postoperative left axilla. IMPRESSION: CHF. Electronically Signed   By: Jorje Guild M.D.   On: 05/24/2022 06:24     Subjective: Pt is breathing better, able to lie recumbent now and also able to ambulate well with no SOB.  She is taking lasix and diuresing well.    Discharge Exam: Vitals:   05/26/22 0742 05/26/22 0813  BP:  129/81  Pulse:  86  Resp:    Temp:    SpO2: 91%    Vitals:   05/26/22 0519 05/26/22 0522 05/26/22 0742 05/26/22 0813  BP: 131/82   129/81  Pulse: 81   86  Resp:  18     Temp: 98.3 F (36.8 C)     TempSrc: Oral     SpO2: 99%  91%   Weight:  40.7 kg    Height:       General: Pt is alert, awake, not in acute  distress Cardiovascular: normal S1/S2 +, no rubs, no gallops Respiratory: CTA bilaterally, no wheezing, no rhonchi Abdominal: Soft, NT, ND, bowel sounds + Extremities: no edema, no cyanosis   The results of significant diagnostics from this hospitalization (including imaging, microbiology, ancillary and laboratory) are listed below for reference.     Microbiology: Recent Results (from the past 240 hour(s))  Resp panel by RT-PCR (RSV, Flu A&B, Covid) Anterior Nasal Swab     Status: None   Collection Time: 05/24/22  6:05 AM   Specimen: Anterior Nasal Swab  Result Value Ref Range Status   SARS Coronavirus 2 by RT PCR NEGATIVE NEGATIVE Final    Comment: (NOTE) SARS-CoV-2 target nucleic acids are NOT DETECTED.  The SARS-CoV-2 RNA is generally detectable in upper respiratory specimens during the acute phase of infection. The lowest concentration of SARS-CoV-2 viral copies this assay can detect is 138 copies/mL. A negative result does not preclude SARS-Cov-2 infection and should not be used as the sole basis for treatment or other patient management decisions. A negative result may occur with  improper specimen collection/handling, submission of specimen other than nasopharyngeal swab, presence of viral mutation(s) within the areas targeted by this assay, and inadequate number of viral copies(<138 copies/mL). A negative result must be combined with clinical observations, patient history, and epidemiological information. The expected result is Negative.  Fact Sheet for Patients:  EntrepreneurPulse.com.au  Fact Sheet for Healthcare Providers:  IncredibleEmployment.be  This test is no t yet approved or cleared by the [place] FDA and  has been authorized for detection and/or diagnosis of SARS-CoV-2 by FDA under an Emergency Use Authorization (EUA). This EUA will remain  in effect (meaning this test can be used) for the duration of the COVID-19  declaration under Section 564(b)(1) of the Act, 21 U.S.C.section 360bbb-3(b)(1), unless the authorization is terminated  or revoked sooner.       Influenza A by PCR NEGATIVE NEGATIVE Final   Influenza B by PCR NEGATIVE NEGATIVE Final    Comment: (NOTE) The Xpert Xpress SARS-CoV-2/FLU/RSV plus assay is intended as an aid in the diagnosis of influenza from Nasopharyngeal swab specimens and should not be used as a sole basis for treatment. Nasal washings and aspirates are unacceptable for Xpert Xpress SARS-CoV-2/FLU/RSV testing.  Fact Sheet for Patients: EntrepreneurPulse.com.au  Fact Sheet for Healthcare Providers: IncredibleEmployment.be  This test is not yet approved or cleared by the [place] FDA and has been authorized for detection and/or diagnosis of SARS-CoV-2 by FDA under an Emergency Use Authorization (EUA). This EUA will remain in effect (meaning this test can be used) for the duration of the COVID-19 declaration under Section 564(b)(1) of the Act, 21 U.S.C. section 360bbb-3(b)(1), unless the authorization is terminated or revoked.     Resp Syncytial Virus by PCR NEGATIVE NEGATIVE Final    Comment: (NOTE) Fact Sheet for Patients: EntrepreneurPulse.com.au  Fact Sheet for Healthcare Providers: IncredibleEmployment.be  This test is not yet approved or cleared by the [place] FDA and has been authorized for detection and/or diagnosis of SARS-CoV-2 by FDA under an Emergency Use Authorization (EUA). This EUA will remain in effect (meaning this test can be used) for the duration of the COVID-19 declaration under Section  564(b)(1) of the Act, 21 U.S.C. section 360bbb-3(b)(1), unless the authorization is terminated or revoked.  Performed at [facility], [address] [place], [place] [address]      Labs: BNP (last 3 results) Recent Labs    03/06/22 0240 05/24/22 0605 05/25/22 0410   BNP 1,274.0* 929.0* 086.7*   Basic Metabolic Panel: Recent Labs  Lab 05/24/22 0605 05/25/22 0410  NA 135 134*  K 4.2 3.6  CL 102 97*  CO2 22 26  GLUCOSE 130* 92  BUN 28* 33*  CREATININE 1.23* 1.33*  CALCIUM 9.4 9.1  MG  --  1.9   Liver Function Tests: Recent Labs  Lab 05/24/22 0605  AST 25  ALT 12  ALKPHOS 49  BILITOT 1.1  PROT 8.8*  ALBUMIN 3.5   No results for input(s): "LIPASE", "AMYLASE" in the last 168 hours. No results for input(s): "AMMONIA" in the last 168 hours. CBC: Recent Labs  Lab 05/24/22 0605 05/25/22 0410  WBC 9.8 8.6  HGB 11.5* 10.3*  HCT 37.3 32.6*  MCV 98.9 95.6  PLT 300 263   Cardiac Enzymes: No results for input(s): "CKTOTAL", "CKMB", "CKMBINDEX", "TROPONINI" in the last 168 hours. BNP: Invalid input(s): "POCBNP" CBG: No results for input(s): "GLUCAP" in the last 168 hours. D-Dimer No results for input(s): "DDIMER" in the last 72 hours. Hgb A1c No results for input(s): "HGBA1C" in the last 72 hours. Lipid Profile No results for input(s): "CHOL", "HDL", "LDLCALC", "TRIG", "CHOLHDL", "LDLDIRECT" in the last 72 hours. Thyroid function studies No results for input(s): "TSH", "T4TOTAL", "T3FREE", "THYROIDAB" in the last 72 hours.  Invalid input(s): "FREET3" Anemia work up No results for input(s): "VITAMINB12", "FOLATE", "FERRITIN", "TIBC", "IRON", "RETICCTPCT" in the last 72 hours. Urinalysis    Component Value Date/Time   COLORURINE AMBER (A) 03/06/2022 0520   APPEARANCEUR CLEAR 03/06/2022 0520   LABSPEC 1.025 03/06/2022 0520   PHURINE 6.0 03/06/2022 0520   GLUCOSEU NEGATIVE 03/06/2022 0520   HGBUR NEGATIVE 03/06/2022 0520   BILIRUBINUR NEGATIVE 03/06/2022 0520   KETONESUR 5 (A) 03/06/2022 0520   PROTEINUR 100 (A) 03/06/2022 0520   UROBILINOGEN 0.2 02/15/2015 0417   NITRITE NEGATIVE 03/06/2022 0520   LEUKOCYTESUR NEGATIVE 03/06/2022 0520   Sepsis Labs Recent Labs  Lab 05/24/22 0605 05/25/22 0410  WBC 9.8 8.6    Microbiology Recent Results (from the past 240 hour(s))  Resp panel by RT-PCR (RSV, Flu A&B, Covid) Anterior Nasal Swab     Status: None   Collection Time: 05/24/22  6:05 AM   Specimen: Anterior Nasal Swab  Result Value Ref Range Status   SARS Coronavirus 2 by RT PCR NEGATIVE NEGATIVE Final    Comment: (NOTE) SARS-CoV-2 target nucleic acids are NOT DETECTED.  The SARS-CoV-2 RNA is generally detectable in upper respiratory specimens during the acute phase of infection. The lowest concentration of SARS-CoV-2 viral copies this assay can detect is 138 copies/mL. A negative result does not preclude SARS-Cov-2 infection and should not be used as the sole basis for treatment or other patient management decisions. A negative result may occur with  improper specimen collection/handling, submission of specimen other than nasopharyngeal swab, presence of viral mutation(s) within the areas targeted by this assay, and inadequate number of viral copies(<138 copies/mL). A negative result must be combined with clinical observations, patient history, and epidemiological information. The expected result is Negative.  Fact Sheet for Patients:  EntrepreneurPulse.com.au  Fact Sheet for Healthcare Providers:  IncredibleEmployment.be  This test is no t yet approved or cleared  by the [place] and  has been authorized for detection and/or diagnosis of SARS-CoV-2 by FDA under an Emergency Use Authorization (EUA). This EUA will remain  in effect (meaning this test can be used) for the duration of the COVID-19 declaration under Section 564(b)(1) of the Act, 21 U.S.C.section 360bbb-3(b)(1), unless the authorization is terminated  or revoked sooner.       Influenza A by PCR NEGATIVE NEGATIVE Final   Influenza B by PCR NEGATIVE NEGATIVE Final    Comment: (NOTE) The Xpert Xpress SARS-CoV-2/FLU/RSV plus assay is intended as an aid in the diagnosis of  influenza from Nasopharyngeal swab specimens and should not be used as a sole basis for treatment. Nasal washings and aspirates are unacceptable for Xpert Xpress SARS-CoV-2/FLU/RSV testing.  Fact Sheet for Patients: EntrepreneurPulse.com.au  Fact Sheet for Healthcare Providers: IncredibleEmployment.be  This test is not yet approved or cleared by the [place] FDA and has been authorized for detection and/or diagnosis of SARS-CoV-2 by FDA under an Emergency Use Authorization (EUA). This EUA will remain in effect (meaning this test can be used) for the duration of the COVID-19 declaration under Section 564(b)(1) of the Act, 21 U.S.C. section 360bbb-3(b)(1), unless the authorization is terminated or revoked.     Resp Syncytial Virus by PCR NEGATIVE NEGATIVE Final    Comment: (NOTE) Fact Sheet for Patients: EntrepreneurPulse.com.au  Fact Sheet for Healthcare Providers: IncredibleEmployment.be  This test is not yet approved or cleared by the [place] FDA and has been authorized for detection and/or diagnosis of SARS-CoV-2 by FDA under an Emergency Use Authorization (EUA). This EUA will remain in effect (meaning this test can be used) for the duration of the COVID-19 declaration under Section 564(b)(1) of the Act, 21 U.S.C. section 360bbb-3(b)(1), unless the authorization is terminated or revoked.  Performed at [facility], [address], [place], [place] [address]    Time coordinating discharge:  42 mins   SIGNED:  Irwin Brakeman, MD  [facility] 05/26/2022, 10:16 AM How to contact the [facility] Attending or Consulting provider [place] or covering provider during after hours [place], for this patient?  Check the care team in [facility] and look for a) attending/consulting [facility] provider listed and b) the [facility] team listed Log into [email or web address] and use [place]'s universal password to access. If you do  not have the password, please contact the hospital operator. Locate the [facility] provider you are looking for under [facility] and page to a number that you can be directly reached. If you still have difficulty reaching the provider, please page the [facility] (Director on Call) for the Hospitalists listed on amion for assistance.

## 2022-05-26 NOTE — Discharge Instructions (Signed)

## 2022-05-26 NOTE — Plan of Care (Signed)
  Problem: Acute Rehab PT Goals(only PT should resolve) Goal: Pt Will Go Supine/Side To Sit Outcome: Progressing Flowsheets (Taken 05/26/2022 1053) Pt will go Supine/Side to Sit: Independently Goal: Patient Will Transfer Sit To/From Stand Outcome: Progressing Flowsheets (Taken 05/26/2022 1053) Patient will transfer sit to/from stand: with min guard assist Goal: Pt Will Transfer Bed To Chair/Chair To Bed Outcome: Progressing Flowsheets (Taken 05/26/2022 1053) Pt will Transfer Bed to Chair/Chair to Bed: min guard assist Goal: Pt Will Ambulate Outcome: Progressing Flowsheets (Taken 05/26/2022 1053) Pt will Ambulate:  50 feet  with least restrictive assistive device  with min guard assist

## 2022-05-26 NOTE — Evaluation (Signed)
Physical Therapy Evaluation Patient Details Name: [name] MRN: [id number] DOB: [birth year] Today's Date: 05/26/2022  History of Present Illness  [name] is a 82 year old female with 82 year old female with hypertension, hyperlipidemia, prior CVA,Type 2 diabetes mellitus with neuropathy, PAD s/p vascular surgery, Moderate protein calorie malnutrition, atrial fibrillation, s/p left first ray amputation 9/23 by Dr. Boneta Lucks who presented with shortness of breath when lying recumbent.  She was also having chest tightness and numbness to bilateral arms for past hours prior to arrival.  She denies cough,  fever and chills. She denies chest pain.  Her SOB was worse with lying recumbent.  She has COPD but quit smoking in 2008.  She says that she has not been taking her furosemide regularly because she was only taking it when she had edema in her legs and that has not been an issue for her recently.        In ED she was noted to have pulmonary edema on CXR and elevated BNP and she was given IV lasix and required 2L/min nasal cannula oxygen.  Admission requested for new findings of CHF.    Clinical Impression  Patient lying in bed on therapist arrival.  Pleasant and cooperative with therapy assessment.  She performs supine to sit modified independent using handrail to assist with pulling up to sitting and taking slightly extra time to perform; sit to stand with minimal assist from therapist and able to ambulate a few steps with therapist and using to furniture or the wall to balance. State she normally will push a transport chair at home for safe ambulation.  Patient able to put her legs back up into the bed on her own.  Nursing notified of above. Patient will benefit from continued skilled therapy services during the remainder of her hospital stay and at the next recommended venue of care to address deficits and promote return to optimal function.           Recommendations for follow up therapy are  one component of a multi-disciplinary discharge planning process, led by the attending physician.  Recommendations may be updated based on patient status, additional functional criteria and insurance authorization.  Follow Up Recommendations Home health PT      Assistance Recommended at Discharge Intermittent Supervision/Assistance  Patient can return home with the following  A little help with walking and/or transfers;A little help with bathing/dressing/bathroom;Help with stairs or ramp for entrance    Equipment Recommendations None recommended by PT  Recommendations for Other Services       Functional Status Assessment Patient has had a recent decline in their functional status and demonstrates the ability to make significant improvements in function in a reasonable and predictable amount of time.     Precautions / Restrictions Precautions Precautions: Fall Restrictions Weight Bearing Restrictions: No      Mobility  Bed Mobility Overal bed mobility: Modified Independent             General bed mobility comments: supine to sit using arms to assist with pulling up to sitting Patient Response: Cooperative  Transfers Overall transfer level: Needs assistance Equipment used: 1 person hand held assist Transfers: Sit to/from Stand Sit to Stand: Min assist, Min guard           General transfer comment: min Assist for balance    Ambulation/Gait Ambulation/Gait assistance: Min assist Gait Distance (Feet): 5 Feet Assistive device: 1 person hand held assist Gait Pattern/deviations: Decreased stance time - left, Decreased stance  time - right, Decreased step length - left, Decreased step length - right       General Gait Details: impaired standing balance  Stairs            Wheelchair Mobility    Modified Rankin (Stroke Patients Only)       Balance Overall balance assessment: Needs assistance Sitting-balance support: Bilateral upper extremity supported,  Feet supported Sitting balance-Leahy Scale: Good Sitting balance - Comments: good sitting balance on the edge of the bed with feet and arms supported   Standing balance support: Single extremity supported, Reliant on assistive device for balance, During functional activity Standing balance-Leahy Scale: Fair Standing balance comment: fair standing balance with PT hand held assist                             Pertinent Vitals/Pain Pain Assessment Pain Assessment: No/denies pain    Home Living Family/patient expects to be discharged to:: Private residence Living Arrangements: Spouse/significant other Available Help at Discharge: Family Type of Home: House Home Access: Stairs to enter Entrance Stairs-Rails: Psychiatric nurse of Steps: 4   Home Layout: One level Home Equipment: Transport chair;Grab bars - tub/shower;Rollator (4 wheels);Shower seat;Toilet riser;Adaptive equipment      Prior Function Prior Level of Function : Needs assist             Mobility Comments: typically household ambulatory, however, has not ambulated much since August. Typically pushes transport chair and sits to take breaks; occasionally furniture walks short distances ADLs Comments: pt husband usually  assists with shower transfers but has been unable to shower due to wound left foot     Hand Dominance   Dominant Hand: Right    Extremity/Trunk Assessment   Upper Extremity Assessment Upper Extremity Assessment: Generalized weakness    Lower Extremity Assessment Lower Extremity Assessment: Generalized weakness;LLE deficits/detail LLE Deficits / Details: wound left foot and ankle; wrapped    Cervical / Trunk Assessment Cervical / Trunk Assessment: Normal  Communication   Communication: No difficulties  Cognition Arousal/Alertness: Awake/alert   Overall Cognitive Status: Within Functional Limits for tasks assessed                                           General Comments General comments (skin integrity, edema, etc.): left ankle wrapped; patient states she has wounds left foot and ankle    Exercises     Assessment/Plan    PT Assessment Patient needs continued PT services  PT Problem List Decreased strength;Decreased activity tolerance;Decreased skin integrity;Decreased balance;Decreased mobility       PT Treatment Interventions Balance training;Gait training;Neuromuscular re-education;Functional mobility training;Patient/family education;Therapeutic activities;Therapeutic exercise    PT Goals (Current goals can be found in the Care Plan section)  Acute Rehab PT Goals Patient Stated Goal: return home PT Goal Formulation: With patient Time For Goal Achievement: 06/09/22 Potential to Achieve Goals: Good    Frequency Min 3X/week     Co-evaluation               AM-PAC PT "6 Clicks" Mobility  Outcome Measure Help needed turning from your back to your side while in a flat bed without using bedrails?: A Little Help needed moving from lying on your back to sitting on the side of a flat bed without using bedrails?: A Little Help needed moving to  and from a bed to a chair (including a wheelchair)?: A Little Help needed standing up from a chair using your arms (e.g., wheelchair or bedside chair)?: A Little Help needed to walk in hospital room?: A Little Help needed climbing 3-5 steps with a railing? : A Lot 6 Click Score: 17    End of Session   Activity Tolerance: Patient tolerated treatment well Patient left: in bed;with call bell/phone within reach Nurse Communication: Mobility status PT Visit Diagnosis: Unsteadiness on feet (R26.81);Other abnormalities of gait and mobility (R26.89);Muscle weakness (generalized) (M62.81)    Time: 3893-7342 PT Time Calculation (min) (ACUTE ONLY): 19 min   Charges:   PT Evaluation $PT Eval Low Complexity: 1 Low          10:53 AM, 05/26/22 Shanterria Franta Small Alayiah Fontes MPT [facility] [place] [phone] TL:[phone]

## 2022-05-28 ENCOUNTER — Telehealth: Payer: Self-pay | Admitting: Cardiovascular Disease

## 2022-05-28 DIAGNOSIS — I480 Paroxysmal atrial fibrillation: Secondary | ICD-10-CM

## 2022-05-28 NOTE — Telephone Encounter (Signed)
Called patient's husband (DPR) back. He stated that patient is wanting to take something else for A. FIB, that after patient is taking amiodarone for weeks then her tremors get worse. Informed patient's husband that we could see if patient can get a referral to A. FIB clinic or EP to get her on something that works for her. Will see if [name] is okay with referral or wants to change amiodarone to something else.

## 2022-05-28 NOTE — Telephone Encounter (Signed)
Patient wanted clarification should she resume taking amiodarone due to her recent hospitalization. She stated it was recommended to d/c amiodarone due to her tremors, however, she said this has not decreased the tremors. Will forward to nurse and MD.

## 2022-05-28 NOTE — Telephone Encounter (Signed)
Patient is aware someone will call them to schedule with the A. FIB clinic. Referral has been ordered, and will also send a message to their nurse.

## 2022-05-28 NOTE — Telephone Encounter (Signed)
Pt c/o medication issue:  1. Name of Medication: amiodarone   2. How are you currently taking this medication (dosage and times per day)?   3. Are you having a reaction (difficulty breathing--STAT)?   4. What is your medication issue? Patient called wanting to know if she should stay off of this medication or go back on it. Please advise.

## 2022-06-07 ENCOUNTER — Other Ambulatory Visit (HOSPITAL_COMMUNITY): Payer: Self-pay | Admitting: Internal Medicine

## 2022-06-07 DIAGNOSIS — M810 Age-related osteoporosis without current pathological fracture: Secondary | ICD-10-CM

## 2022-06-08 LAB — LAB REPORT - SCANNED: EGFR: 41

## 2022-06-11 ENCOUNTER — Ambulatory Visit (HOSPITAL_COMMUNITY)
Admission: RE | Admit: 2022-06-11 | Discharge: 2022-06-11 | Disposition: A | Discharge status: Home / Self Care | Payer: Medicare HMO | Source: Ambulatory Visit | Attending: Physician Assistant | Admitting: Physician Assistant

## 2022-06-11 VITALS — BP 148/78 | HR 93 | Ht 61.5 in | Wt 96.8 lb

## 2022-06-11 DIAGNOSIS — I739 Peripheral vascular disease, unspecified: Secondary | ICD-10-CM | POA: Diagnosis not present

## 2022-06-11 DIAGNOSIS — I4819 Other persistent atrial fibrillation: Secondary | ICD-10-CM

## 2022-06-11 DIAGNOSIS — D6869 Other thrombophilia: Secondary | ICD-10-CM | POA: Insufficient documentation

## 2022-06-11 DIAGNOSIS — I484 Atypical atrial flutter: Secondary | ICD-10-CM | POA: Insufficient documentation

## 2022-06-11 DIAGNOSIS — I4811 Longstanding persistent atrial fibrillation: Secondary | ICD-10-CM | POA: Insufficient documentation

## 2022-06-11 DIAGNOSIS — I5020 Unspecified systolic (congestive) heart failure: Secondary | ICD-10-CM | POA: Insufficient documentation

## 2022-06-11 DIAGNOSIS — I110 Hypertensive heart disease with heart failure: Secondary | ICD-10-CM | POA: Insufficient documentation

## 2022-06-11 DIAGNOSIS — Z7901 Long term (current) use of anticoagulants: Secondary | ICD-10-CM | POA: Insufficient documentation

## 2022-06-11 DIAGNOSIS — Z8673 Personal history of transient ischemic attack (TIA), and cerebral infarction without residual deficits: Secondary | ICD-10-CM | POA: Diagnosis not present

## 2022-06-11 DIAGNOSIS — E785 Hyperlipidemia, unspecified: Secondary | ICD-10-CM | POA: Insufficient documentation

## 2022-06-11 NOTE — Progress Notes (Signed)
Primary Care Physician: Celene Squibb, MD Primary Cardiologist: [name] Primary Electrophysiologist: N/A Referring Physician: Dr. Allyn Kenner   [name] is a 82 y.o. female with a history of hypertension, hyperlipidemia, prior CVA, neuropathy, PAD s/p vascular surgery, history of COPD, GERD, moderate protein calorie malnutrition, atrial fibrillation, atrial flutter, s/p left first ray amputation 9/23 by Dr. Boneta Lucks, and recent hospital admission for acute HFrEF from 2/15-17. Echocardiogram on 2/15 showed EF 40-45% and moderately dilated LA. Amiodarone was started (low dose) in September 2023 but recently discontinued due to worsening tremors.  She states she does not feel her Afib or atrial flutter and since recent hospital admission feels well overall. She is compliant with her eliquis BID and metroprolol TID.  Today, she denies symptoms of palpitations, chest pain, shortness of breath, orthopnea, PND, lower extremity edema, dizziness, presyncope, syncope, snoring, daytime somnolence, bleeding, or neurologic sequela. The patient is tolerating medications without difficulties and is otherwise without complaint today.    Atrial Fibrillation Risk Factors:  she does not have symptoms or diagnosis of sleep apnea. she does not have a history of rheumatic fever. she does not have a history of alcohol use. The patient does not have a history of early familial atrial fibrillation or other arrhythmias.  she has a BMI of Body mass index is 17.99 kg/m.Marland Kitchen Filed Weights   06/11/22 1018  Weight: 43.9 kg    Family History  Problem Relation Age of Onset   Alzheimer's disease Mother    Dementia Mother    Hypertension Mother    Hyperlipidemia Mother    Parkinsonism Father      Atrial Fibrillation Management history:  Previous antiarrhythmic drugs: amiodarone Previous cardioversions: N/A Previous ablations: N/A CHADS2VASC score: 6 Anticoagulation history: eliquis 2.5 mg BID     Past Medical History:  Diagnosis Date   Anemia 05/2014.   Atrial fibrillation ([place])    x 3 yrs   Breast cancer ([place])    S/P left mastectomy and chemotherapy 1989 remained in remission   Carotid artery occlusion    Carotid bruit    LICA [id number] (duplex [id number])   Degenerative arthritis of spine 2015   Chronic back pain   Hyperlipidemia    Hypertension    Meningioma ([place])    Osteoporosis    Pneumonia May 19, 2014   Stroke [facility])    CVA 2008   Subclavian steal syndrome    Ulcers of both lower extremities ([place]) 2015   Past Surgical History:  Procedure Laterality Date   ABDOMINAL AORTOGRAM W/LOWER EXTREMITY N/A 12/19/2021   Procedure: ABDOMINAL AORTOGRAM W/LOWER EXTREMITY;  Surgeon: Serafina Mitchell, MD;  Location: [place] [facility];  Service: Cardiovascular;  Laterality: N/A;   AMPUTATION TOE Left 12/29/2021   Procedure: Incisional DEBRIDEMENT Left Foot with Amputation Left Great Toe;  Surgeon: Felipa Furnace, DPM;  Location: [place];  Service: Podiatry;  Laterality: Left;   CARDIAC CATHETERIZATION     CATARACT EXTRACTION Bilateral 2013   COLONOSCOPY  11/17/2010   Procedure: COLONOSCOPY;  Surgeon: [name], MD;  [facility];  Service: Endoscopy;  Laterality: N/A;  10:45 am   ESOPHAGOGASTRODUODENOSCOPY  11/17/2010   Procedure: ESOPHAGOGASTRODUODENOSCOPY (EGD);  Surgeon: [name], MD;  [facility];  Service: Endoscopy;  Laterality: N/A;   ESOPHAGOGASTRODUODENOSCOPY N/A 02/16/2015   Procedure: ESOPHAGOGASTRODUODENOSCOPY (EGD);  Surgeon: [name], MD;  Location: [place];  Service: Gastroenterology;  Laterality: N/A;   EXPLORATORY LAPAROTOMY  1960s   For peritonitis of undetermined cause   EYE SURGERY     LUMBAR EPIDURAL INJECTION  06-2012--06-2013   pt. states she has had 5 epidurals in 06-2012----06-2013   MASTECTOMY Left 1990   PERIPHERAL VASCULAR INTERVENTION Left 12/19/2021   Procedure: PERIPHERAL VASCULAR INTERVENTION;   Surgeon: Serafina Mitchell, MD;  Location: [place] [facility];  Service: Cardiovascular;  Laterality: Left;  SFA and PTA of POP   YAG LASER APPLICATION Right [id number]   Procedure: YAG LASER APPLICATION;  Surgeon: [name], MD;  Location: [facility];  Service: Ophthalmology;  Laterality: Right;  right   YAG LASER APPLICATION Left [id number]   Procedure: YAG LASER APPLICATION;  Surgeon: [name], MD;  Location: [facility];  Service: Ophthalmology;  Laterality: Left;    Current Outpatient Medications  Medication Sig Dispense Refill   acetaminophen (TYLENOL) 325 MG tablet Take 2 tablets (650 mg total) by mouth every 6 (six) hours as needed for mild pain (or Fever >/= 101).     ascorbic acid (VITAMIN C) 500 MG tablet Take 500 mg by mouth daily.     CALCIUM PO Take 1,200 mg by mouth daily.     Cholecalciferol (VITAMIN D-3 PO) Take 3 tablets by mouth every morning. 3000 units total     clopidogrel (PLAVIX) 75 MG tablet TAKE 1 TABLET EVERY MORNING 90 tablet 3   denosumab (PROLIA) 60 MG/ML SOSY injection Inject 60 mg into the skin every 6 (six) months.     DENTA 5000 PLUS 1.1 % CREA dental cream SMARTSIG:Sparingly By Mouth Daily     ELIQUIS 2.5 MG TABS tablet TAKE 1 TABLET TWICE A DAY 180 tablet 1   Ferrous Sulfate (IRON PO) Take 65 mg by mouth daily at 6 (six) AM.     furosemide (LASIX) 20 MG tablet Take 1 tablet (20 mg total) by mouth daily. Only as needed PRN 30 tablet 2   MAGNESIUM OXIDE PO Take 500 mg by mouth every morning.     metoprolol tartrate (LOPRESSOR) 25 MG tablet TAKE 3 TABLETS TWICE A DAY 540 tablet 3   Multiple Vitamin (MULTIVITAMIN WITH MINERALS) TABS tablet Take 1 tablet by mouth every morning.     pantoprazole (PROTONIX) 40 MG tablet TAKE 1 TABLET DAILY 90 tablet 3   potassium chloride (KLOR-CON M) 10 MEQ tablet Take 1 tablet (10 mEq total) by mouth daily. 30 tablet 2   pravastatin (PRAVACHOL) 40 MG tablet Take 1 tablet (40 mg total) by mouth daily. 90 tablet 3   SANTYL 250 UNIT/GM  ointment APPLY TO AFFECTED AREAS ONCE DAILY. 30 g 0   umeclidinium bromide (INCRUSE ELLIPTA) 62.5 MCG/ACT AEPB Inhale 1 puff into the lungs daily.     No current facility-administered medications for this encounter.    Allergies  Allergen Reactions   Iodine Rash and Other (See Comments)    BETADINE Rash/burning, blisters on skin. Burns skin   Iohexol Rash and Other (See Comments)    Blisters; PT NEEDS 13-HOUR PREP    Sulfa Antibiotics Nausea And Vomiting   Petroleum Gauze Non-Woven 3x9" [Wound Dressings] Other (See Comments)    BURNING SENSATION, RED SKIN   Povidone-Iodine Other (See Comments)    Burns skin   Wound Dressing Adhesive Other (See Comments)    Burns skin   Tape Itching, Rash and Other (See Comments)    "DO NOT USE ADHESIVE TAPE" Not even a Band Aid" NO PAPER TAPE Burns skin Skin is very very thin  Social History   Socioeconomic History   Marital status: Married    Spouse name: Not on file   Number of children: 2   Years of education: Not on file   Highest education level: Not on file  Occupational History   Occupation: Retired    Fish farm manager: RETIRED  Tobacco Use   Smoking status: Former    Packs/day: 1.00    Years: 56.00    Total pack years: 56.00    Types: Cigarettes    Quit date: 08/14/2006    Years since quitting: 15.8    Passive exposure: Never   Smokeless tobacco: Never   Tobacco comments:    quit in 2008  Vaping Use   Vaping Use: Never used  Substance and Sexual Activity   Alcohol use: No    Alcohol/week: 0.0 standard drinks of alcohol   Drug use: No   Sexual activity: Never  Other Topics Concern   Not on file  Social History Narrative   Not on file   Social Determinants of Health   Financial Resource Strain: Not on file  Food Insecurity: No Food Insecurity (05/24/2022)   Hunger Vital Sign    Worried About Running Out of Food in the Last Year: Never true    [place] in the Last Year: Never true  Transportation Needs: No  Transportation Needs (05/24/2022)   PRAPARE - Hydrologist (Medical): No    Lack of Transportation (Non-Medical): No  Physical Activity: Not on file  Stress: Not on file  Social Connections: Not on file  Intimate Partner Violence: Not At Risk (05/24/2022)   Humiliation, Afraid, Rape, and Kick questionnaire    Fear of Current or Ex-Partner: No    Emotionally Abused: No    Physically Abused: No    Sexually Abused: No     ROS- All systems are reviewed and negative except as per the HPI above.  Physical Exam: Vitals:   06/11/22 1018  BP: (!) 148/78  Pulse: 93  Weight: 43.9 kg  Height: 5' 1.5" (1.562 m)    GEN- The patient is well appearing sitting in wheelchair, alert and oriented x 3 today.   Head- normocephalic, atraumatic Eyes-  Sclera clear, conjunctiva pink Ears- hearing intact Oropharynx- clear Neck- supple  Lungs- Clear to ausculation bilaterally, normal work of breathing Heart- Regular rate and rhythm, no murmurs, rubs or gallops  GI- soft, NT, ND, + BS Extremities- no clubbing, cyanosis, or edema MS- no significant deformity or atrophy Skin- no rash or lesion Psych- euthymic mood, full affect Neuro- strength and sensation are intact  Wt Readings from Last 3 Encounters:  06/11/22 43.9 kg  05/26/22 40.7 kg  04/27/22 44.1 kg    EKG today demonstrates  Atypical Atrial flutter with 2:1 AV block Vent. rate 93 BPM PR interval 232 ms QRS duration 74 ms QT/QTcB 402/499 ms  Echo 2/15 demonstrated: 1. Left ventricular ejection fraction, by estimation, is 40 to 45%. The  left ventricle has mildly decreased function. The left ventricle  demonstrates global hypokinesis. There is mild asymmetric left ventricular  hypertrophy of the basal segment. Left  ventricular diastolic parameters are indeterminate.   2. Right ventricular systolic function is normal. The right ventricular  size is normal. There is mildly elevated pulmonary artery  systolic  pressure. The estimated right ventricular systolic pressure is [id number] mmHg.   3. Left atrial size was moderately dilated.   4. The mitral valve is degenerative. Mild to  moderate mitral valve  regurgitation.   5. The aortic valve is tricuspid. Aortic valve regurgitation is mild.  Aortic valve sclerosis/calcification is present, without any evidence of  aortic stenosis.   6. The inferior vena cava is normal in size with greater than 50%  respiratory variability, suggesting right atrial pressure of 3 mmHg.   Epic records are reviewed at length today  CHA2DS2-VASc Score = 8  The patient's score is based upon: CHF History: 1 HTN History: 1 Diabetes History: 0 Stroke History: 2 Vascular Disease History: 1 Age Score: 2 Gender Score: 1       ASSESSMENT AND PLAN: Longstanding Persistent Atrial Fibrillation / Atypical atrial flutter The patient's CHA2DS2-VASc score is 8, indicating a 10.8% annual risk of stroke.   Review of previous ECGs demonstrate last ECG in SR was back in 2019. She appear to be in atrial flutter today.  Discussion with patient and husband regarding treatment options. Patient's age, history of COPD, and creatine clearance estimate of 22 mL/min do not make her a good candidate for ablation.  Medication therapy options include multaq, however with recent admission for acute HF this would not be a good option.  Due to history of HF and CAD, patient is not a good candidate for flecainide or propafenone.   Due to creatinine clearance of 22 mL/min, she would technically be a candidate for the lowest dose of tikosyn. However, at this low dose it would be a lower likelihood for the medicine to be effective.  Amiodarone could be an option, however patient declines due to hand tremors that made it very difficult for her to write.  After discussion of options, patient would prefer to continue to be rate controlled with current regimen and not start a new treatment at  this time. I am okay with this plan and will continue with no change.   2. Secondary Hypercoagulable State (ICD10:  D68.69) The patient is at significant risk for stroke/thromboembolism based upon her CHA2DS2-VASc Score of 8.  Continue Apixaban (Eliquis).  No bleeding issues at this time.   3. HFrEF Continue current regimen. Fluid status appears euvolemic today.   Follow up with [name] as scheduled. AF clinic PRN   [facility] [address] [place], [place] [address] [phone] 06/11/2022 11:40 AM

## 2022-06-12 ENCOUNTER — Encounter (HOSPITAL_COMMUNITY)
Admission: RE | Admit: 2022-06-12 | Discharge: 2022-06-12 | Disposition: A | Discharge status: Home / Self Care | Payer: Medicare HMO | Source: Ambulatory Visit | Attending: Internal Medicine | Admitting: Internal Medicine

## 2022-06-12 VITALS — BP 148/77 | HR 97 | Temp 97.6°F | Resp 16

## 2022-06-12 DIAGNOSIS — M810 Age-related osteoporosis without current pathological fracture: Secondary | ICD-10-CM | POA: Diagnosis not present

## 2022-06-12 MED ORDER — DENOSUMAB 60 MG/ML ~~LOC~~ SOSY
60.0000 mg | PREFILLED_SYRINGE | Freq: Once | SUBCUTANEOUS | Status: AC
  Administered 2022-06-12: 60 mg via SUBCUTANEOUS

## 2022-06-12 NOTE — Progress Notes (Deleted)
Assessment/Plan:   1.  Essential Tremor.  -She is now off of the amiodarone, so that is no longer playing a role.  -Unfortunately, she is still on Eliquis, so we cannot use primidone in her.  -She is already on a beta-blocker.  She is on metoprolol, 75 mg twice per day.   Subjective:   [name] was seen in consultation in the movement disorder clinic at the request of Josue Hector, MD.  The evaluation is for tremor.  I saw the patient 1 time back in early 2015 and again in September, 2019.  I have not seen the patient since.  When I saw the patient back in September, 2019 I felt the patient likely had essential tremor, exacerbated by amiodarone.  They had decreased the amiodarone, and patient had noted an improvement in tremor, but she was not able to get all the way off of it.  At that point in time, the patient was on Eliquis and was not able to take primidone and was not a candidate for beta-blockers because of bradycardia and lung function.  She returns today because of complaints of a bothersome tremor.  She is now on metoprolol, 75 mg twice per day.  She is still on Eliquis.  She is off of amiodarone because of tremor.  She was recently hospitalized and after the hospitalization they recommended going back on it.  Patient's husband felt that it worsened the tremor and wanted to stay off of it.  She was subsequently referred here as well as the A-fib clinic.  She did go to the A-fib clinic and saw the PA.  They felt that medication therapy in general would not be a good option because of her recent history of acute congestive heart failure.  I felt that she would only be a candidate for the lowest dose of Tikosyn and it would be too low for the medication to be effective.  Amiodarone caused her hand tremor.  Ultimately patient decided to just continue with what was her current regiment of metoprolol and Eliquis.  Tremor started approximately decades ago and involves the ***.  Tremor is most  noticeable when ***.   There is a family hx of tremor; son with tremor and father with Parkinson's disease.    Affected by caffeine:  No. Affected by alcohol:  Yes.  (But does not drink much) Affected by stress:  Yes.   Affected by fatigue:  Yes.   Spills soup if on spoon:  Yes.   Spills glass of liquid if full:  Yes.   Affects ADL's (tying shoes, brushing teeth, etc):  Yes.    Current/Previously tried tremor medications: ***  Current medications that may exacerbate tremor:  ***  Outside reports reviewed: {Outside review:15817}.  Allergies  Allergen Reactions   Iodine Rash and Other (See Comments)    BETADINE Rash/burning, blisters on skin. Burns skin   Iohexol Rash and Other (See Comments)    Blisters; PT NEEDS 13-HOUR PREP    Sulfa Antibiotics Nausea And Vomiting   Petroleum Gauze Non-Woven 3x9" [Wound Dressings] Other (See Comments)    BURNING SENSATION, RED SKIN   Povidone-Iodine Other (See Comments)    Burns skin   Wound Dressing Adhesive Other (See Comments)    Burns skin   Tape Itching, Rash and Other (See Comments)    "DO NOT USE ADHESIVE TAPE" Not even a Band Aid" NO PAPER TAPE Burns skin Skin is very very thin    No outpatient  medications have been marked as taking for the 06/18/22 encounter (Appointment) with Dinora Hemm, Eustace Quail, DO.      Objective:   VITALS:  There were no vitals filed for this visit. Gen:  Appears stated age and in NAD. HEENT:  Normocephalic, atraumatic. The mucous membranes are moist. The superficial temporal arteries are without ropiness or tenderness. Cardiovascular: Regular rate and rhythm. Lungs: Clear to auscultation bilaterally. Neck: There are no carotid bruits noted bilaterally.  NEUROLOGICAL:  Orientation:  The patient is alert and oriented x 3.   Cranial nerves: There is good facial symmetry. Extraocular muscles are intact and visual fields are full to confrontational testing. Speech is fluent and clear. Soft palate rises  symmetrically and there is no tongue deviation. Hearing is intact to conversational tone. Tone: Tone is good throughout. Sensation: Sensation is intact to light touch touch throughout (facial, trunk, extremities). Vibration is intact at the bilateral big toe. There is no extinction with double simultaneous stimulation. There is no sensory dermatomal level identified. Coordination:  The patient has no dysdiadichokinesia or dysmetria. Motor: Strength is 5/5 in the bilateral upper and lower extremities.  Shoulder shrug is equal bilaterally.  There is no pronator drift.  There are no fasciculations noted. DTR's: Deep tendon reflexes are 2/4 at the bilateral biceps, triceps, brachioradialis, patella and achilles.  Plantar responses are downgoing bilaterally. Gait and Station: The patient is able to ambulate without difficulty. The patient is able to heel toe walk without any difficulty. The patient is able to ambulate in a tandem fashion. The patient is able to stand in the Romberg position.   MOVEMENT EXAM: Tremor:  There is *** tremor in the UE, noted most significantly with action.  The patient is *** able to draw Archimedes spirals without significant difficulty.  There is *** tremor at rest.  The patient is *** able to pour water from one glass to another without spilling it.  I have reviewed and interpreted the following labs independently   Chemistry      Component Value Date/Time   NA 134 (L) 05/25/2022 0410   NA 136 (A) 08/11/2010 0000   K 3.6 05/25/2022 0410   CL 97 (L) 05/25/2022 0410   CO2 26 05/25/2022 0410   BUN 33 (H) 05/25/2022 0410   BUN 21 08/11/2010 0000   CREATININE 1.33 (H) 05/25/2022 0410   CREATININE 0.57 (L) 03/14/2015 1230   GLU 84 08/11/2010 0000      Component Value Date/Time   CALCIUM 9.1 05/25/2022 0410   ALKPHOS 49 05/24/2022 0605   AST 25 05/24/2022 0605   ALT 12 05/24/2022 0605   BILITOT 1.1 05/24/2022 0605      Lab Results  Component Value Date   WBC  8.6 05/25/2022   HGB 10.3 (L) 05/25/2022   HCT 32.6 (L) 05/25/2022   MCV 95.6 05/25/2022   PLT 263 05/25/2022   Lab Results  Component Value Date   TSH 5.333 (H) 03/06/2022      Total time spent on today's visit was ***60 minutes, including both face-to-face time and nonface-to-face time.  Time included that spent on review of records (prior notes available to me/labs/imaging if pertinent), discussing treatment and goals, answering patient's questions and coordinating care.  CC:  Celene Squibb, MD

## 2022-06-12 NOTE — Progress Notes (Signed)
Diagnosis: Osteoporosis  Provider:  Wende Neighbors MD  Procedure: Injection  Prolia (Denosumab), Dose: 60 mg, Site: subcutaneous, Number of injections: 1  Post Care: Patient declined observation  Discharge: Condition: Good, Destination: Home . AVS Provided  Performed by:  Baxter Hire, RN

## 2022-06-12 NOTE — Addendum Note (Signed)
Encounter addended by: Baxter Hire, RN on: 06/12/2022 11:43 AM  Actions taken: Therapy plan modified

## 2022-06-14 ENCOUNTER — Ambulatory Visit (INDEPENDENT_AMBULATORY_CARE_PROVIDER_SITE_OTHER): Payer: Medicare HMO | Admitting: Podiatry

## 2022-06-14 DIAGNOSIS — L97522 Non-pressure chronic ulcer of other part of left foot with fat layer exposed: Secondary | ICD-10-CM | POA: Diagnosis not present

## 2022-06-14 NOTE — Progress Notes (Signed)
Subjective:  Patient ID: [name], female    DOB: 09/24/1940,  MRN: [id number]  Chief Complaint  Patient presents with   Foot Ulcer    DOS: 12/29/2021 Procedure: Left partial first ray amputation with excisional debridement of wound with application of skin graft  82 y.o. female returns for post-op check.  Patient states that she is doing okay.  The top of the foot wound is doing much better.  She has a new wound to the left lateral ankle she wanted to get it evaluated.  Review of Systems: Negative except as noted in the HPI. Denies N/V/F/Ch.  Past Medical History:  Diagnosis Date   Anemia 05/2014.   Atrial fibrillation ([place])    x 3 yrs   Breast cancer ([place])    S/P left mastectomy and chemotherapy 1989 remained in remission   Carotid artery occlusion    Carotid bruit    LICA [id number] (duplex [id number])   Degenerative arthritis of spine 2015   Chronic back pain   Hyperlipidemia    Hypertension    Meningioma ([place])    Osteoporosis    Pneumonia May 19, 2014   Stroke [facility])    CVA 2008   Subclavian steal syndrome    Ulcers of both lower extremities ([place]) 2015   No current facility-administered medications for this visit. No current outpatient medications on file.  Facility-Administered Medications Ordered in Other Visits:    0.9 %  sodium chloride infusion, 250 mL, Intravenous, PRN, Tat, David, MD, Last Rate: 10 mL/hr at 06/19/22 1029, 250 mL at 06/19/22 1029   acetaminophen (TYLENOL) tablet 650 mg, 650 mg, Oral, Q4H PRN, Tat, David, MD, 650 mg at 06/18/22 2132   apixaban (ELIQUIS) tablet 2.5 mg, 2.5 mg, Oral, BID, Tat, David, MD, 2.5 mg at 06/19/22 [id number]   ascorbic acid (VITAMIN C) tablet 500 mg, 500 mg, Oral, Daily, Tat, David, MD, 500 mg at 06/19/22 0826   azithromycin (ZITHROMAX) 500 mg in sodium chloride 0.9 % 250 mL IVPB, 500 mg, Intravenous, Q24H, Tat, David, MD, Last Rate: 250 mL/hr at 06/19/22 1032, 500 mg at 06/19/22 1032   clopidogrel (PLAVIX) tablet 75 mg, 75 mg,  Oral, q morning, Tat, David, MD, 75 mg at 06/19/22 [id number]   collagenase (SANTYL) ointment, , Topical, Daily, Isla Pence, MD, Given at 06/19/22 1032   metoprolol tartrate (LOPRESSOR) tablet 75 mg, 75 mg, Oral, BID, Tat, Shanon Brow, MD, 75 mg at 06/18/22 2132   multivitamin with minerals tablet 1 tablet, 1 tablet, Oral, q morning, Tat, David, MD, 1 tablet at 06/19/22 0826   ondansetron (ZOFRAN) injection 4 mg, 4 mg, Intravenous, Q6H PRN, Tat, David, MD   pantoprazole (PROTONIX) EC tablet 40 mg, 40 mg, Oral, Daily, Tat, David, MD, 40 mg at 06/19/22 0826   piperacillin-tazobactam (ZOSYN) IVPB 3.375 g, 3.375 g, Intravenous, Q8H, Tat, Shanon Brow, MD, Last Rate: 12.5 mL/hr at 06/19/22 0610, 3.375 g at 06/19/22 0610   pravastatin (PRAVACHOL) tablet 40 mg, 40 mg, Oral, Daily, Tat, David, MD, 40 mg at 06/18/22 1313   sodium chloride flush (NS) 0.9 % injection 3 mL, 3 mL, Intravenous, Q12H, Tat, David, MD, 3 mL at 06/18/22 2133   sodium chloride flush (NS) 0.9 % injection 3 mL, 3 mL, Intravenous, PRN, Tat, David, MD  Social History   Tobacco Use  Smoking Status Former   Packs/day: 1.00   Years: 56.00   Total pack years: 56.00   Types: Cigarettes   Quit date: 08/14/2006   Years since quitting:  15.8   Passive exposure: Never  Smokeless Tobacco Never  Tobacco Comments   quit in 2008    Allergies  Allergen Reactions   Iodine Rash and Other (See Comments)    BETADINE Rash/burning, blisters on skin. Burns skin   Iohexol Rash and Other (See Comments)    Blisters; PT NEEDS 13-HOUR PREP    Sulfa Antibiotics Nausea And Vomiting   Petroleum Gauze Non-Woven 3x9" [Wound Dressings] Other (See Comments)    BURNING SENSATION, RED SKIN   Povidone-Iodine Other (See Comments)    Burns skin   Wound Dressing Adhesive Other (See Comments)    Burns skin   Tape Itching, Rash and Other (See Comments)    "DO NOT USE ADHESIVE TAPE" Not even a Band Aid" NO PAPER TAPE Burns skin Skin is very very thin   Objective:    There were no vitals filed for this visit.  There is no height or weight on file to calculate BMI. Constitutional Well developed. Well nourished.  Vascular Foot warm and well perfused. Capillary refill normal to all digits.   Neurologic Normal speech. Oriented to person, place, and time. Epicritic sensation to light touch grossly present bilaterally.  Dermatologic  Left lateral ankle with fat layer exposed.  No signs of infection noted.  No malodor present does not probe down to bone.  Orthopedic: No tenderness to palpation noted about the surgical site.   Radiographs: None Assessment:   No diagnosis found.   Plan:  Patient was evaluated and treated and all questions answered.  S/p foot surgery left -Clinic healed.  No signs of dehiscence noted.  No complications noted.  Left dorsal foot wound with fat layer exposed -Clinically healed  Left lateral ankle with fat layer exposed -Continue Santyl wet-to-dry dressing.  Appears to be slowly getting better.  Will continue monitor the progression of it.  Minimal debridement was carried out with mechanical debridement. - No follow-ups on file.    No follow-ups on file.

## 2022-06-18 ENCOUNTER — Inpatient Hospital Stay (HOSPITAL_COMMUNITY)
Admission: EM | Admit: 2022-06-18 | Discharge: 2022-06-24 | DRG: 871 | Disposition: A | Discharge status: Home Health | Payer: Medicare HMO | Attending: Family Medicine | Admitting: Family Medicine

## 2022-06-18 ENCOUNTER — Encounter (HOSPITAL_COMMUNITY): Payer: Self-pay | Admitting: Emergency Medicine

## 2022-06-18 ENCOUNTER — Other Ambulatory Visit: Payer: Self-pay

## 2022-06-18 ENCOUNTER — Emergency Department (HOSPITAL_COMMUNITY): Payer: Medicare HMO

## 2022-06-18 ENCOUNTER — Ambulatory Visit: Payer: Medicare HMO | Admitting: Neurology

## 2022-06-18 DIAGNOSIS — E782 Mixed hyperlipidemia: Secondary | ICD-10-CM | POA: Diagnosis not present

## 2022-06-18 DIAGNOSIS — J690 Pneumonitis due to inhalation of food and vomit: Secondary | ICD-10-CM | POA: Diagnosis present

## 2022-06-18 DIAGNOSIS — I130 Hypertensive heart and chronic kidney disease with heart failure and stage 1 through stage 4 chronic kidney disease, or unspecified chronic kidney disease: Secondary | ICD-10-CM | POA: Diagnosis present

## 2022-06-18 DIAGNOSIS — E876 Hypokalemia: Secondary | ICD-10-CM | POA: Diagnosis present

## 2022-06-18 DIAGNOSIS — Z87891 Personal history of nicotine dependence: Secondary | ICD-10-CM | POA: Diagnosis not present

## 2022-06-18 DIAGNOSIS — L03116 Cellulitis of left lower limb: Secondary | ICD-10-CM | POA: Diagnosis present

## 2022-06-18 DIAGNOSIS — I5023 Acute on chronic systolic (congestive) heart failure: Secondary | ICD-10-CM | POA: Insufficient documentation

## 2022-06-18 DIAGNOSIS — Z83438 Family history of other disorder of lipoprotein metabolism and other lipidemia: Secondary | ICD-10-CM

## 2022-06-18 DIAGNOSIS — I429 Cardiomyopathy, unspecified: Secondary | ICD-10-CM | POA: Diagnosis present

## 2022-06-18 DIAGNOSIS — S91002A Unspecified open wound, left ankle, initial encounter: Secondary | ICD-10-CM

## 2022-06-18 DIAGNOSIS — Z820 Family history of epilepsy and other diseases of the nervous system: Secondary | ICD-10-CM

## 2022-06-18 DIAGNOSIS — E785 Hyperlipidemia, unspecified: Secondary | ICD-10-CM | POA: Diagnosis present

## 2022-06-18 DIAGNOSIS — R6520 Severe sepsis without septic shock: Secondary | ICD-10-CM | POA: Diagnosis present

## 2022-06-18 DIAGNOSIS — Z8249 Family history of ischemic heart disease and other diseases of the circulatory system: Secondary | ICD-10-CM

## 2022-06-18 DIAGNOSIS — Z79899 Other long term (current) drug therapy: Secondary | ICD-10-CM

## 2022-06-18 DIAGNOSIS — I4819 Other persistent atrial fibrillation: Secondary | ICD-10-CM | POA: Diagnosis present

## 2022-06-18 DIAGNOSIS — R911 Solitary pulmonary nodule: Secondary | ICD-10-CM | POA: Diagnosis present

## 2022-06-18 DIAGNOSIS — Z1152 Encounter for screening for COVID-19: Secondary | ICD-10-CM

## 2022-06-18 DIAGNOSIS — Z7901 Long term (current) use of anticoagulants: Secondary | ICD-10-CM

## 2022-06-18 DIAGNOSIS — A419 Sepsis, unspecified organism: Secondary | ICD-10-CM | POA: Diagnosis present

## 2022-06-18 DIAGNOSIS — K8020 Calculus of gallbladder without cholecystitis without obstruction: Secondary | ICD-10-CM

## 2022-06-18 DIAGNOSIS — Z66 Do not resuscitate: Secondary | ICD-10-CM | POA: Diagnosis present

## 2022-06-18 DIAGNOSIS — I739 Peripheral vascular disease, unspecified: Secondary | ICD-10-CM | POA: Diagnosis present

## 2022-06-18 DIAGNOSIS — Z8673 Personal history of transient ischemic attack (TIA), and cerebral infarction without residual deficits: Secondary | ICD-10-CM | POA: Diagnosis not present

## 2022-06-18 DIAGNOSIS — I484 Atypical atrial flutter: Secondary | ICD-10-CM | POA: Diagnosis present

## 2022-06-18 DIAGNOSIS — I110 Hypertensive heart disease with heart failure: Secondary | ICD-10-CM | POA: Diagnosis present

## 2022-06-18 DIAGNOSIS — Z9013 Acquired absence of bilateral breasts and nipples: Secondary | ICD-10-CM

## 2022-06-18 DIAGNOSIS — Z882 Allergy status to sulfonamides status: Secondary | ICD-10-CM

## 2022-06-18 DIAGNOSIS — Z888 Allergy status to other drugs, medicaments and biological substances status: Secondary | ICD-10-CM

## 2022-06-18 DIAGNOSIS — J9601 Acute respiratory failure with hypoxia: Secondary | ICD-10-CM | POA: Diagnosis present

## 2022-06-18 DIAGNOSIS — Z89422 Acquired absence of other left toe(s): Secondary | ICD-10-CM

## 2022-06-18 DIAGNOSIS — N1831 Chronic kidney disease, stage 3a: Secondary | ICD-10-CM | POA: Diagnosis present

## 2022-06-18 DIAGNOSIS — I5041 Acute combined systolic (congestive) and diastolic (congestive) heart failure: Secondary | ICD-10-CM | POA: Diagnosis not present

## 2022-06-18 DIAGNOSIS — G250 Essential tremor: Secondary | ICD-10-CM | POA: Diagnosis present

## 2022-06-18 DIAGNOSIS — I509 Heart failure, unspecified: Secondary | ICD-10-CM

## 2022-06-18 DIAGNOSIS — M810 Age-related osteoporosis without current pathological fracture: Secondary | ICD-10-CM | POA: Diagnosis present

## 2022-06-18 DIAGNOSIS — Z7902 Long term (current) use of antithrombotics/antiplatelets: Secondary | ICD-10-CM

## 2022-06-18 DIAGNOSIS — J449 Chronic obstructive pulmonary disease, unspecified: Secondary | ICD-10-CM | POA: Diagnosis present

## 2022-06-18 DIAGNOSIS — Z923 Personal history of irradiation: Secondary | ICD-10-CM

## 2022-06-18 DIAGNOSIS — Z9221 Personal history of antineoplastic chemotherapy: Secondary | ICD-10-CM

## 2022-06-18 DIAGNOSIS — Z853 Personal history of malignant neoplasm of breast: Secondary | ICD-10-CM

## 2022-06-18 DIAGNOSIS — Z883 Allergy status to other anti-infective agents status: Secondary | ICD-10-CM

## 2022-06-18 DIAGNOSIS — R54 Age-related physical debility: Secondary | ICD-10-CM | POA: Diagnosis present

## 2022-06-18 DIAGNOSIS — J470 Bronchiectasis with acute lower respiratory infection: Secondary | ICD-10-CM | POA: Diagnosis present

## 2022-06-18 DIAGNOSIS — Z7951 Long term (current) use of inhaled steroids: Secondary | ICD-10-CM

## 2022-06-18 DIAGNOSIS — Z91041 Radiographic dye allergy status: Secondary | ICD-10-CM

## 2022-06-18 LAB — HEPATIC FUNCTION PANEL
ALT: 32 U/L (ref 0–44)
AST: 60 U/L — ABNORMAL HIGH (ref 15–41)
Albumin: 3.2 g/dL — ABNORMAL LOW (ref 3.5–5.0)
Alkaline Phosphatase: 78 U/L (ref 38–126)
Bilirubin, Direct: 0.2 mg/dL (ref 0.0–0.2)
Indirect Bilirubin: 0.7 mg/dL (ref 0.3–0.9)
Total Bilirubin: 0.9 mg/dL (ref 0.3–1.2)
Total Protein: 7.8 g/dL (ref 6.5–8.1)

## 2022-06-18 LAB — CBC WITH DIFFERENTIAL/PLATELET
Abs Immature Granulocytes: 0.14 10*3/uL — ABNORMAL HIGH (ref 0.00–0.07)
Basophils Absolute: 0.1 10*3/uL (ref 0.0–0.1)
Basophils Relative: 0 %
Eosinophils Absolute: 0.1 10*3/uL (ref 0.0–0.5)
Eosinophils Relative: 1 %
HCT: 36.3 % (ref 36.0–46.0)
Hemoglobin: 10.9 g/dL — ABNORMAL LOW (ref 12.0–15.0)
Immature Granulocytes: 1 %
Lymphocytes Relative: 5 %
Lymphs Abs: 1 10*3/uL (ref 0.7–4.0)
MCH: 31.8 pg (ref 26.0–34.0)
MCHC: 30 g/dL (ref 30.0–36.0)
MCV: 105.8 fL — ABNORMAL HIGH (ref 80.0–100.0)
Monocytes Absolute: 0.7 10*3/uL (ref 0.1–1.0)
Monocytes Relative: 3 %
Neutro Abs: 18 10*3/uL — ABNORMAL HIGH (ref 1.7–7.7)
Neutrophils Relative %: 90 %
Platelets: 271 10*3/uL (ref 150–400)
RBC: 3.43 MIL/uL — ABNORMAL LOW (ref 3.87–5.11)
RDW: 17.6 % — ABNORMAL HIGH (ref 11.5–15.5)
WBC: 20 10*3/uL — ABNORMAL HIGH (ref 4.0–10.5)
nRBC: 0 % (ref 0.0–0.2)

## 2022-06-18 LAB — LACTIC ACID, PLASMA
Lactic Acid, Venous: 1.1 mmol/L (ref 0.5–1.9)
Lactic Acid, Venous: 4.3 mmol/L (ref 0.5–1.9)

## 2022-06-18 LAB — RESP PANEL BY RT-PCR (RSV, FLU A&B, COVID)  RVPGX2
Influenza A by PCR: NEGATIVE
Influenza B by PCR: NEGATIVE
Resp Syncytial Virus by PCR: NEGATIVE
SARS Coronavirus 2 by RT PCR: NEGATIVE

## 2022-06-18 LAB — BASIC METABOLIC PANEL
Anion gap: 15 (ref 5–15)
BUN: 33 mg/dL — ABNORMAL HIGH (ref 8–23)
CO2: 16 mmol/L — ABNORMAL LOW (ref 22–32)
Calcium: 8.5 mg/dL — ABNORMAL LOW (ref 8.9–10.3)
Chloride: 107 mmol/L (ref 98–111)
Creatinine, Ser: 1.12 mg/dL — ABNORMAL HIGH (ref 0.44–1.00)
GFR, Estimated: 48 mL/min — ABNORMAL LOW (ref >60)
Glucose, Bld: 134 mg/dL — ABNORMAL HIGH (ref 70–99)
Potassium: 4.8 mmol/L (ref 3.5–5.1)
Sodium: 138 mmol/L (ref 135–145)

## 2022-06-18 LAB — TROPONIN I (HIGH SENSITIVITY)
Troponin I (High Sensitivity): 14 ng/L (ref <18)
Troponin I (High Sensitivity): 14 ng/L (ref <18)

## 2022-06-18 LAB — MRSA NEXT GEN BY PCR, NASAL: MRSA by PCR Next Gen: NOT DETECTED

## 2022-06-18 LAB — BRAIN NATRIURETIC PEPTIDE: B Natriuretic Peptide: 866 pg/mL — ABNORMAL HIGH (ref 0.0–100.0)

## 2022-06-18 LAB — PROCALCITONIN: Procalcitonin: 4.92 ng/mL

## 2022-06-18 MED ORDER — CLOPIDOGREL BISULFATE 75 MG PO TABS
75.0000 mg | ORAL_TABLET | Freq: Every morning | ORAL | Status: DC
  Administered 2022-06-19 – 2022-06-24 (×6): 75 mg via ORAL
  Filled 2022-06-18 (×6): qty 1

## 2022-06-18 MED ORDER — ACETAMINOPHEN 325 MG PO TABS
650.0000 mg | ORAL_TABLET | ORAL | Status: DC | PRN
  Administered 2022-06-18 – 2022-06-22 (×3): 650 mg via ORAL
  Filled 2022-06-18 (×3): qty 2

## 2022-06-18 MED ORDER — SODIUM CHLORIDE 0.9 % IV SOLN
1.0000 g | Freq: Once | INTRAVENOUS | Status: AC
  Administered 2022-06-18: 1 g via INTRAVENOUS
  Filled 2022-06-18: qty 10

## 2022-06-18 MED ORDER — PANTOPRAZOLE SODIUM 40 MG PO TBEC
40.0000 mg | DELAYED_RELEASE_TABLET | Freq: Every day | ORAL | Status: DC
  Administered 2022-06-19 – 2022-06-24 (×6): 40 mg via ORAL
  Filled 2022-06-18 (×6): qty 1

## 2022-06-18 MED ORDER — METOPROLOL TARTRATE 50 MG PO TABS
75.0000 mg | ORAL_TABLET | Freq: Two times a day (BID) | ORAL | Status: DC
  Administered 2022-06-18: 75 mg via ORAL
  Filled 2022-06-18 (×3): qty 1

## 2022-06-18 MED ORDER — ADULT MULTIVITAMIN W/MINERALS CH
1.0000 | ORAL_TABLET | Freq: Every morning | ORAL | Status: DC
  Administered 2022-06-19 – 2022-06-24 (×6): 1 via ORAL
  Filled 2022-06-18 (×7): qty 1

## 2022-06-18 MED ORDER — PRAVASTATIN SODIUM 40 MG PO TABS
40.0000 mg | ORAL_TABLET | Freq: Every day | ORAL | Status: DC
  Administered 2022-06-18 – 2022-06-23 (×6): 40 mg via ORAL
  Filled 2022-06-18 (×6): qty 1

## 2022-06-18 MED ORDER — APIXABAN 2.5 MG PO TABS
2.5000 mg | ORAL_TABLET | Freq: Two times a day (BID) | ORAL | Status: DC
  Administered 2022-06-18 – 2022-06-24 (×12): 2.5 mg via ORAL
  Filled 2022-06-18 (×12): qty 1

## 2022-06-18 MED ORDER — SODIUM CHLORIDE 0.9 % IV SOLN
250.0000 mL | INTRAVENOUS | Status: DC | PRN
  Administered 2022-06-19: 250 mL via INTRAVENOUS

## 2022-06-18 MED ORDER — PIPERACILLIN-TAZOBACTAM 3.375 G IVPB
3.3750 g | Freq: Three times a day (TID) | INTRAVENOUS | Status: DC
  Administered 2022-06-18 – 2022-06-24 (×18): 3.375 g via INTRAVENOUS
  Filled 2022-06-18 (×18): qty 50

## 2022-06-18 MED ORDER — VITAMIN C 500 MG PO TABS
500.0000 mg | ORAL_TABLET | Freq: Every day | ORAL | Status: DC
  Administered 2022-06-18 – 2022-06-24 (×7): 500 mg via ORAL
  Filled 2022-06-18 (×7): qty 1

## 2022-06-18 MED ORDER — SODIUM CHLORIDE 0.9 % IV BOLUS
1000.0000 mL | Freq: Once | INTRAVENOUS | Status: AC
  Administered 2022-06-18: 1000 mL via INTRAVENOUS

## 2022-06-18 MED ORDER — SODIUM CHLORIDE 0.9% FLUSH: 3.0000 mL | INTRAVENOUS | Status: DC | PRN

## 2022-06-18 MED ORDER — SODIUM CHLORIDE 0.9 % IV SOLN
500.0000 mg | INTRAVENOUS | Status: DC
  Administered 2022-06-18 – 2022-06-22 (×5): 500 mg via INTRAVENOUS
  Filled 2022-06-18 (×5): qty 5

## 2022-06-18 MED ORDER — ONDANSETRON HCL 4 MG/2ML IJ SOLN
4.0000 mg | Freq: Once | INTRAMUSCULAR | Status: AC
  Administered 2022-06-18: 4 mg via INTRAVENOUS
  Filled 2022-06-18: qty 2

## 2022-06-18 MED ORDER — COLLAGENASE 250 UNIT/GM EX OINT
TOPICAL_OINTMENT | Freq: Every day | CUTANEOUS | Status: DC
  Administered 2022-06-20: 1 via TOPICAL
  Filled 2022-06-18: qty 30

## 2022-06-18 MED ORDER — SODIUM CHLORIDE 0.9% FLUSH
3.0000 mL | Freq: Two times a day (BID) | INTRAVENOUS | Status: DC
  Administered 2022-06-18 – 2022-06-24 (×9): 3 mL via INTRAVENOUS

## 2022-06-18 MED ORDER — SODIUM CHLORIDE 0.9 % IV SOLN: 1.0000 g | INTRAVENOUS | Status: DC

## 2022-06-18 MED ORDER — ONDANSETRON HCL 4 MG/2ML IJ SOLN: 4.0000 mg | Freq: Four times a day (QID) | INTRAMUSCULAR | Status: DC | PRN

## 2022-06-18 MED ORDER — MORPHINE SULFATE (PF) 4 MG/ML IV SOLN
4.0000 mg | Freq: Once | INTRAVENOUS | Status: AC
  Administered 2022-06-18: 4 mg via INTRAVENOUS
  Filled 2022-06-18: qty 1

## 2022-06-18 NOTE — Evaluation (Addendum)
Clinical/Bedside Swallow Evaluation Patient Details  Name: [name] MRN: [id number] Date of Birth: Aug 31, 1940  Today's Date: 06/18/2022 Time: SLP Start Time (ACUTE ONLY): [id number] SLP Stop Time (ACUTE ONLY): 1718 SLP Time Calculation (min) (ACUTE ONLY): 29 min  Past Medical History:  Past Medical History:  Diagnosis Date   Anemia 05/2014.   Atrial fibrillation ([place])    x 3 yrs   Breast cancer ([place])    S/P left mastectomy and chemotherapy 1989 remained in remission   Carotid artery occlusion    Carotid bruit    LICA [id number] (duplex [id number])   Degenerative arthritis of spine 2015   Chronic back pain   Hyperlipidemia    Hypertension    Meningioma ([place])    Osteoporosis    Pneumonia May 19, 2014   Stroke [facility])    CVA 2008   Subclavian steal syndrome    Ulcers of both lower extremities ([place]) 2015   Past Surgical History:  Past Surgical History:  Procedure Laterality Date   ABDOMINAL AORTOGRAM W/LOWER EXTREMITY N/A 12/19/2021   Procedure: ABDOMINAL AORTOGRAM W/LOWER EXTREMITY;  Surgeon: Serafina Mitchell, MD;  Location: [facility];  Service: Cardiovascular;  Laterality: N/A;   AMPUTATION TOE Left 12/29/2021   Procedure: Incisional DEBRIDEMENT Left Foot with Amputation Left Great Toe;  Surgeon: Felipa Furnace, DPM;  Location: [facility];  Service: Podiatry;  Laterality: Left;   CARDIAC CATHETERIZATION     CATARACT EXTRACTION Bilateral 2013   COLONOSCOPY  11/17/2010   Procedure: COLONOSCOPY;  Surgeon: [name], MD;  [facility];  Service: Endoscopy;  Laterality: N/A;  10:45 am   ESOPHAGOGASTRODUODENOSCOPY  11/17/2010   Procedure: ESOPHAGOGASTRODUODENOSCOPY (EGD);  Surgeon: [name], MD;  [facility];  Service: Endoscopy;  Laterality: N/A;   ESOPHAGOGASTRODUODENOSCOPY N/A 02/16/2015   Procedure: ESOPHAGOGASTRODUODENOSCOPY (EGD);  Surgeon: [name], MD;  Location: [facility];  Service: Gastroenterology;  Laterality: N/A;    EXPLORATORY LAPAROTOMY  1960s   For peritonitis of undetermined cause   EYE SURGERY     LUMBAR EPIDURAL INJECTION  06-2012--06-2013   pt. states she has had 5 epidurals in 06-2012----06-2013   MASTECTOMY Left 1990   PERIPHERAL VASCULAR INTERVENTION Left 12/19/2021   Procedure: PERIPHERAL VASCULAR INTERVENTION;  Surgeon: Serafina Mitchell, MD;  Location: [facility];  Service: Cardiovascular;  Laterality: Left;  SFA and PTA of POP   YAG LASER APPLICATION Right [id number]   Procedure: YAG LASER APPLICATION;  Surgeon: [name], MD;  Location: [facility];  Service: Ophthalmology;  Laterality: Right;  right   YAG LASER APPLICATION Left [id number]   Procedure: YAG LASER APPLICATION;  Surgeon: [name], MD;  Location: [facility];  Service: Ophthalmology;  Laterality: Left;   HPI:  [name] is a 82 year old female with a history of persistent atrial fibrillation, COPD/bronchiectasis, hypertension, hyperlipidemia, peripheral arterial disease, HFrEF, and essential tremor presenting with shortness of breath that began in the morning of 06/18/2022.  The patient states that she woke up at her usual time around 430 and drink some coffee.  About an hour afterwards, she began having some shortness of breath that had gradually worsened with an episode of nausea and vomiting.  Over the past week, the patient has felt some fluttering sensation in her chest with associated shortness of breath.  She denies any frank orthopnea or worsening lower extremity edema.  She denies any chest pain, headache, neck pain, hematemesis, abdominal pain, diarrhea, dysuria, hematuria.  She has a nonproductive cough.  She has some subjective fevers and chills on the morning of admission with some worsening of her essential tremor.  The patient recently followed up that the A-fib clinic on 06/11/2022.  In general, she has not felt to be a good candidate for antiarrhythmic therapy due to her multiple comorbidities.  In addition, the  patient was initially admitted to the hospital from 03/05/2022 to 03/09/2022 due to Acute HFrEF.  She was discharged home with furosemide 20 mg daily and metoprolol heart rate.  In the ED, the patient was afebrile and hemodynamically stable with oxygen saturation in the upper 80s on room air.  She was placed on 3 L with saturation 93-94%.  WBC 20.0, hemoglobin 10.9, platelets 271,000.  Sodium 138, potassium 4.8, bicarbonate 16, serum creatinine 1.12.  The patient was started on ceftriaxone and azithromycin.  CT of the chest showed bilateral septal thickening with trace right and small left pleural effusion.  There was debris in the airways.  There is a cavitary LLL nodule.  This was adjacent to her previous established stable 3 mm LLL nodule.  There is indeterminate age T5 compression fracture.BSE requested. Pt known to this SLP from [place] completed in 2017.    MBSS 06/2015:  <<Mrs. Sweetland presents with mild pharyngeal phase dysphagia characterized by pooling in the pyriforms after the swallow with thin liquids, likely due to use of straw (residuals trickle down are not sensed well once in the pyriforms). Residuals in pyriforms are minimal, however could be considered a risk for aspiration. She had one episode where residuals started to spill towards arytenoids into laryngeal vestibule, however pt immediately cleared her throat and material did not reach vocal folds/aspiration not visualized. Given history of frequent, unexplained pneumonia episodes, SLP instructed pt and husband on aspiration precautions anc compensatory strategies to minimize risk of aspiration. Pt cued to swallow 2x for each sip and clear throat periodically with repeat swallow when consuming liquids to help clear pyriforms. Pt verbalized understanding and written handout provided. >>    Assessment / Plan / Recommendation  Clinical Impression  Clinical swallow evaluation completed at bedside with Pt's husband present. Pt and SLP were familiar  with each other from MBSS completed in March 2017. See results of that study above. Pt denies difficulty swallowing, but states that she does frequently eat meals in her bed (able to elevate HOB) and that sometimes she has a globus sensation in mid chest. Oral motor examination is WNL, no gross facial asymmetry. Pt assessed with dinner meal of regular textures and thin liquids and does not exhibit any signs or symptoms of aspiration. Recommend regular textures and thin liquids with standard aspiration and reflux precautions. OK for PO medication whole with water. SLP reviewed aspiration and reflux precautions; Pt encouraged to eat meals up in chair at home and remain upright for 30+ minutes after. No further SLP services indicated at this time. SLP will sign off. SLP Visit Diagnosis: Dysphagia, unspecified (R13.10)    Aspiration Risk  No limitations    Diet Recommendation Regular;Thin liquid   Liquid Administration via: Cup;Straw Medication Administration: Whole meds with liquid Supervision: Patient able to self feed Postural Changes: Seated upright at 90 degrees;Remain upright for at least 30 minutes after po intake    Other  Recommendations Oral Care Recommendations: Oral care BID    Recommendations for follow up therapy are one component of a multi-disciplinary discharge planning process, led by the attending physician.  Recommendations may be updated  based on patient status, additional functional criteria and insurance authorization.  Follow up Recommendations No SLP follow up      Assistance Recommended at Discharge    Functional Status Assessment Patient has not had a recent decline in their functional status  Frequency and Duration            Prognosis Prognosis for improved oropharyngeal function: Good      Swallow Study   General Date of Onset: 06/18/22 HPI: [name] is a 82 year old female with a history of persistent atrial fibrillation, COPD/bronchiectasis,  hypertension, hyperlipidemia, peripheral arterial disease, HFrEF, and essential tremor presenting with shortness of breath that began in the morning of 06/18/2022.  The patient states that she woke up at her usual time around 430 and drink some coffee.  About an hour afterwards, she began having some shortness of breath that had gradually worsened with an episode of nausea and vomiting.  Over the past week, the patient has felt some fluttering sensation in her chest with associated shortness of breath.  She denies any frank orthopnea or worsening lower extremity edema.  She denies any chest pain, headache, neck pain, hematemesis, abdominal pain, diarrhea, dysuria, hematuria.  She has a nonproductive cough.  She has some subjective fevers and chills on the morning of admission with some worsening of her essential tremor.  The patient recently followed up that the A-fib clinic on 06/11/2022.  In general, she has not felt to be a good candidate for antiarrhythmic therapy due to her multiple comorbidities.  In addition, the patient was initially admitted to the hospital from 03/05/2022 to 03/09/2022 due to Acute HFrEF.  She was discharged home with furosemide 20 mg daily and metoprolol heart rate.  In the ED, the patient was afebrile and hemodynamically stable with oxygen saturation in the upper 80s on room air.  She was placed on 3 L with saturation 93-94%.  WBC 20.0, hemoglobin 10.9, platelets 271,000.  Sodium 138, potassium 4.8, bicarbonate 16, serum creatinine 1.12.  The patient was started on ceftriaxone and azithromycin.  CT of the chest showed bilateral septal thickening with trace right and small left pleural effusion.  There was debris in the airways.  There is a cavitary LLL nodule.  This was adjacent to her previous established stable 3 mm LLL nodule.  There is indeterminate age T5 compression fracture.BSE requested. Pt known to this SLP from [place] completed in 2017. Type of Study: Bedside Swallow  Evaluation Previous Swallow Assessment: 06/09/2015 MBSS reg/thin Diet Prior to this Study: Regular;Thin liquids (Level 0) Temperature Spikes Noted: No Respiratory Status: Nasal cannula History of Recent Intubation: No Behavior/Cognition: Alert;Cooperative;Pleasant mood Oral Cavity Assessment: Within Functional Limits Oral Care Completed by SLP: Recent completion by staff Oral Cavity - Dentition: Adequate natural dentition Vision: Functional for self-feeding Self-Feeding Abilities: Able to feed self Patient Positioning: Upright in bed Baseline Vocal Quality: Normal Volitional Cough: Strong Volitional Swallow: Able to elicit    Oral/Motor/Sensory Function Overall Oral Motor/Sensory Function: Within functional limits   Ice Chips Ice chips: Within functional limits Presentation: Spoon   Thin Liquid Thin Liquid: Within functional limits Presentation: Cup;Self Fed;Straw    Nectar Thick Nectar Thick Liquid: Not tested   Honey Thick Honey Thick Liquid: Not tested   Puree Puree: Within functional limits Presentation: Spoon   Solid     Solid: Within functional limits Presentation: Self Fed     Thank you,  Genene Churn, [place]  Jordyne Poehlman 06/18/2022,5:30 PM

## 2022-06-18 NOTE — ED Triage Notes (Signed)
Pt reported she woke up around 0430, had a cup of coffee and around 1 hr later she began to be short of breath and nauseated. Pt reports a similar episode 1 month ago. Patient stopped smoking in 2008, but reports a smoking span of 50 years.

## 2022-06-18 NOTE — Consult Note (Signed)
[facility] Nurse Consult Note: Reason for Consult:Left lateral malleolus wounds. Patient recently seen in consult by my associate D. Engels (06/03/22). Seen and followed by Dr. Posey Pronto, Podiatric medicine. Last visit in office was 06/14/22. Wound type: venous insufficiency vs PAD Pressure Injury POA: N/A Measurement:To be obtained by Bedside RN and documented on Nursing Flow Sheet today with next dressing application  Wound PC:[id number] by yellow fibrinous material no red wound bed visible in either wound Drainage (amount, consistency, odor) thin yellow Periwound:mild erythema, closed wound edges Dressing procedure/placement/frequency: Collagenase (santyl) has been in use for autolytic debridement. Today I have added a calcium alginate for absorption and atraumatic wound care after cleansing. Return to autolytic debridement is advised after wound bed is not quite as moist as it is today. Guidance is provided for Nursing for daily care, floatation of heels and provision of a sacral silicone foam to prevent pressure injuries.  [facility] nursing team will not follow, but will remain available to this patient, the nursing and medical teams.  Please re-consult if needed.  Thank you for inviting [place] to participate in this patient's Plan of Care.  Maudie Flakes, MSN, RN, CNS, [place], Serita Grammes, [employer], [employer] phone:  [phone]

## 2022-06-18 NOTE — Progress Notes (Addendum)
Responded to nursing call:  elevated lactic acid 4.3 up from 1.1  I went to evaluate patient.  Pt sleeping as I entered.  Arouses easily to voice.  Subjective: Pt complains of headache.  Denies f/c, cp, n/v/d, abd pain.  States sob better than earlier this am  Vitals:   06/18/22 1115 06/18/22 1130 06/18/22 1205 06/18/22 1208  BP:  134/84 125/77   Pulse: (!) 105 (!) 105 (!) 113 97  Resp: 19 (!) 24    Temp:   (!) 97.5 F (36.4 C)   TempSrc:   Oral   SpO2: 94% (!) 85% 96%   Weight:   43.9 kg   Height:   '5\' 1"'$  (1.549 m)   A&O x 3 CV--RRR Lung--diminished BS, bibasilar rales Abd--soft+BS/NT   Assessment/Plan: Sepsis -I personally obtained VS--T98.2--HR 95--RR18--88/50--95% on 2L -give NS bolus 1L -PCT 4.92 -MRSA screen --neg -broaden abx coverage pending culture data -blood culture already obtained in ED     Orson Eva, DO [facility]

## 2022-06-18 NOTE — TOC Initial Note (Signed)
Transition of Care [facility]) - Initial/Assessment Note    Patient Details  Name: [name] MRN: [id number] Date of Birth: November 20, 1940  Transition of Care [facility]) CM/SW Contact:    Iona Beard, [place] Phone Number: 06/18/2022, 1:56 PM  Clinical Narrative:                 Pt high risk for readmission. Pt well known to TOC. Pt recently admitted and assessed. CSW spoke with pts husband who states pt is still active with [facility] for PT/RN services. Pt has a cane and transport chair to use when needed. Pt does not weigh daily. TOC to follow.   Expected Discharge Plan: [place] Barriers to Discharge: Continued Medical Work up   Patient Goals and CMS Choice Patient states their goals for this hospitalization and ongoing recovery are:: return home CMS Medicare.gov Compare Post Acute Care list provided to:: Patient Choice offered to / list presented to : Patient, Spouse      Expected Discharge Plan and Services In-house Referral: Clinical Social Work Discharge Planning Services: CM Consult Post Acute Care Choice: [place] arrangements for the past 2 months: [place]                                      Prior Living Arrangements/Services Living arrangements for the past 2 months: Single Family Home Lives with:: Spouse Patient language and need for interpreter reviewed:: Yes Do you feel safe going back to the place where you live?: Yes      Need for Family Participation in Patient Care: Yes (Comment) Care giver support system in place?: Yes (comment) Current home services: DME, Home PT, Home OT Criminal Activity/Legal Involvement Pertinent to Current Situation/Hospitalization: No - Comment as needed  Activities of Daily Living Home Assistive Devices/Equipment: Cane (specify quad or straight), Walker (specify type) ADL Screening (condition at time of admission) Patient's cognitive ability adequate to safely complete daily  activities?: Yes Is the patient deaf or have difficulty hearing?: No Does the patient have difficulty seeing, even when wearing glasses/contacts?: No Does the patient have difficulty concentrating, remembering, or making decisions?: No Patient able to express need for assistance with ADLs?: No Does the patient have difficulty dressing or bathing?: No Independently performs ADLs?: Yes (appropriate for developmental age) Does the patient have difficulty walking or climbing stairs?: No Weakness of Legs: None Weakness of Arms/Hands: None  Permission Sought/Granted                  Emotional Assessment Appearance:: Appears stated age       Alcohol / Substance Use: Not Applicable Psych Involvement: No (comment)  Admission diagnosis:  Lung nodule [R91.1] Calculus of gallbladder without cholecystitis without obstruction [K80.20] Acute respiratory failure with hypoxia ([place]) [J96.01] Ankle wound, left, initial encounter [S91.002A] Stage 3a chronic kidney disease ([place]) [N18.31] Acute on chronic congestive heart failure, unspecified heart failure type ([place]) [I50.9] Patient Active Problem List   Diagnosis Date Noted   Acute respiratory failure with hypoxia ([place]) 06/18/2022   Aspiration pneumonitis ([place]) 06/18/2022   Acute on chronic HFrEF (heart failure with reduced ejection fraction) ([place]) 06/18/2022   Hypercoagulable state due to persistent atrial fibrillation ([place]) 06/11/2022   Acute heart failure with preserved ejection fraction (HFpEF) ([place]) 05/24/2022   Cellulitis and abscess of toe of left foot 03/07/2022   GERD (gastroesophageal reflux disease) 03/06/2022  Atypical atrial flutter (HCC)    Sepsis due to undetermined organism ([place]) 12/25/2021   Diarrhea 12/25/2021   Hyperkalemia 12/25/2021   Osteomyelitis of great toe of left foot ([place]) 12/25/2021   PAD (peripheral artery disease) ([place]) 12/19/2021   Osteoporosis    Tibial plateau fracture, left approx. 05/10/17 06/27/2017    COPD, moderate ([place]) 07/12/2015   Multiple gastric ulcers    Coagulopathy (HCC)    Bleeding gastrointestinal    Acute GI bleeding    CAD in native artery    Supratherapeutic INR    Right lower lobe lung mass    Cavitary lesion of lung    AKI (acute kidney injury) ([place])    Chronic back pain    GI bleed 02/14/2015   Carotid artery disease ([place]) 02/14/2015   Leukocytosis 02/14/2015   Lung nodules 09/29/2014   Smoking history 09/29/2014   Pneumonia with cavity of lung 07/09/2014   Lung mass 07/09/2014   Thrombocytosis ([place]) 05/21/2014   Protein-calorie malnutrition, moderate (HCC) 05/21/2014   Anemia    Generalized weakness    Necrotic pneumonia ([place]) 05/20/2014   Pneumonia 05/20/2014   Elevated liver enzymes 12/16/2013   Tremor 06/24/2013   Encounter for therapeutic drug monitoring 05/18/2013   Dizziness 01/12/2013   Wound of ankle 10/23/2010   Long term current use of anticoagulant 07/05/2010   Carotid bruit 11/01/2009   Hyperlipidemia 10/26/2009   Essential hypertension 10/26/2009   Persistent atrial fibrillation ([place]) 10/26/2009   ACUTE ON CHRONIC DIASTOLIC HEART FAILURE [id number]   SYNCOPE AND COLLAPSE 10/26/2009   PCP:  Celene Squibb, MD Pharmacy:   [facility], [place] [id number] [place] [id number] Phone: [phone] Fax: [phone]  [facility] [place] [facility] [address] [place] [address] Phone: [phone] Fax: [phone]     Social Determinants of Health (SDOH) Social History: [place]: No Food Insecurity (05/24/2022)  Housing: Low Risk  (05/24/2022)  Transportation Needs: No Transportation Needs (05/24/2022)  Utilities: Not At Risk (05/24/2022)  Tobacco Use: Medium Risk (06/18/2022)   SDOH Interventions:     Readmission Risk Interventions    06/18/2022    1:55  PM 03/07/2022    8:09 AM  Readmission Risk Prevention Plan  Transportation Screening Complete Complete  HRI or Home Care Consult Complete Complete  Social Work Consult for [place] Planning/Counseling Complete Complete  Palliative Care Screening Not Applicable Not Applicable  Medication Review Press photographer) Complete Complete

## 2022-06-18 NOTE — H&P (Signed)
History and Physical    Patient: [name] [id number] DOB: 01-28-1941 DOA: 06/18/2022 DOS: the patient was seen and examined on 06/18/2022 PCP: Celene Squibb, MD  Patient coming from: Home  Chief Complaint:  Chief Complaint  Patient presents with   Shortness of Breath   HPI: [name] is a 82 year old female with a history of persistent atrial fibrillation, COPD/bronchiectasis, hypertension, hyperlipidemia, peripheral arterial disease, HFrEF, and essential tremor presenting with shortness of breath that began in the morning of 06/18/2022.  The patient states that she woke up at her usual time around 430 and drink some coffee.  About an hour afterwards, she began having some shortness of breath that had gradually worsened with an episode of nausea and vomiting.  Over the past week, the patient has felt some fluttering sensation in her chest with associated shortness of breath.  She denies any frank orthopnea or worsening lower extremity edema.  She denies any chest pain, headache, neck pain, hematemesis, abdominal pain, diarrhea, dysuria, hematuria.  She has a nonproductive cough.  She has some subjective fevers and chills on the morning of admission with some worsening of her essential tremor.  The patient recently followed up that the A-fib clinic on 06/11/2022.  In general, she has not felt to be a good candidate for antiarrhythmic therapy due to her multiple comorbidities.  In addition, the patient was initially admitted to the hospital from 03/05/2022 to 03/09/2022 due to Acute HFrEF.  She was discharged home with furosemide 20 mg daily and metoprolol heart rate. In the ED, the patient was afebrile and hemodynamically stable with oxygen saturation in the upper 80s on room air.  She was placed on 3 L with saturation 93-94%.  WBC 20.0, hemoglobin 10.9, platelets 271,000.  Sodium 138, potassium 4.8, bicarbonate 16, serum creatinine 1.12.  The patient was started on ceftriaxone and azithromycin.   CT of the chest showed bilateral septal thickening with trace right and small left pleural effusion.  There was debris in the airways.  There is a cavitary LLL nodule.  This was adjacent to her previous established stable 3 mm LLL nodule.  There is indeterminate age T5 compression fracture.  Review of Systems: As mentioned in the history of present illness. All other systems reviewed and are negative. Past Medical History:  Diagnosis Date   Anemia 05/2014.   Atrial fibrillation ([place])    x 3 yrs   Breast cancer ([place])    S/P left mastectomy and chemotherapy 1989 remained in remission   Carotid artery occlusion    Carotid bruit    LICA [id number] (duplex [id number])   Degenerative arthritis of spine 2015   Chronic back pain   Hyperlipidemia    Hypertension    Meningioma ([place])    Osteoporosis    Pneumonia May 19, 2014   Stroke [facility])    CVA 2008   Subclavian steal syndrome    Ulcers of both lower extremities ([place]) 2015   Past Surgical History:  Procedure Laterality Date   ABDOMINAL AORTOGRAM W/LOWER EXTREMITY N/A 12/19/2021   Procedure: ABDOMINAL AORTOGRAM W/LOWER EXTREMITY;  Surgeon: Serafina Mitchell, MD;  Location: [facility];  Service: Cardiovascular;  Laterality: N/A;   AMPUTATION TOE Left 12/29/2021   Procedure: Incisional DEBRIDEMENT Left Foot with Amputation Left Great Toe;  Surgeon: Felipa Furnace, DPM;  Location: [facility];  Service: Podiatry;  Laterality: Left;   CARDIAC CATHETERIZATION     CATARACT EXTRACTION Bilateral 2013   COLONOSCOPY  11/17/2010   Procedure: COLONOSCOPY;  Surgeon: [name], MD;  [facility];  Service: Endoscopy;  Laterality: N/A;  10:45 am   ESOPHAGOGASTRODUODENOSCOPY  11/17/2010   Procedure: ESOPHAGOGASTRODUODENOSCOPY (EGD);  Surgeon: [name], MD;  [facility];  Service: Endoscopy;  Laterality: N/A;   ESOPHAGOGASTRODUODENOSCOPY N/A 02/16/2015   Procedure: ESOPHAGOGASTRODUODENOSCOPY (EGD);  Surgeon: [name], MD;  Location: [facility];  Service: Gastroenterology;  Laterality: N/A;   EXPLORATORY LAPAROTOMY  1960s   For peritonitis of undetermined cause   EYE SURGERY     LUMBAR EPIDURAL INJECTION  06-2012--06-2013   pt. states she has had 5 epidurals in 06-2012----06-2013   MASTECTOMY Left 1990   PERIPHERAL VASCULAR INTERVENTION Left 12/19/2021   Procedure: PERIPHERAL VASCULAR INTERVENTION;  Surgeon: Serafina Mitchell, MD;  Location: [place];  Service: Cardiovascular;  Laterality: Left;  SFA and PTA of POP   YAG LASER APPLICATION Right [id number]   Procedure: YAG LASER APPLICATION;  Surgeon: [name], MD;  Location: [facility];  Service: Ophthalmology;  Laterality: Right;  right   YAG LASER APPLICATION Left [id number]   Procedure: YAG LASER APPLICATION;  Surgeon: [name], MD;  Location: [facility];  Service: Ophthalmology;  Laterality: Left;   Social History:  reports that she quit smoking about 15 years ago. Her smoking use included cigarettes. She has a 56.00 pack-year smoking history. She has never been exposed to tobacco smoke. She has never used smokeless tobacco. She reports that she does not drink alcohol and does not use drugs.  Allergies  Allergen Reactions   Iodine Rash and Other (See Comments)    BETADINE Rash/burning, blisters on skin. Burns skin   Iohexol Rash and Other (See Comments)    Blisters; PT NEEDS 13-HOUR PREP    Sulfa Antibiotics Nausea And Vomiting   Petroleum Gauze Non-Woven 3x9" [Wound Dressings] Other (See Comments)    BURNING SENSATION, RED SKIN   Povidone-Iodine Other (See Comments)    Burns skin   Wound Dressing Adhesive Other (See Comments)    Burns skin   Tape Itching, Rash and Other (See Comments)    "DO NOT USE ADHESIVE TAPE" Not even a Band Aid" NO PAPER TAPE Burns skin Skin is very very thin    Family History  Problem Relation Age of Onset   Alzheimer's disease Mother    Dementia Mother    Hypertension Mother     Hyperlipidemia Mother    Parkinsonism Father     Prior to Admission medications   Medication Sig Start Date End Date Taking? Authorizing Provider  acetaminophen (TYLENOL) 325 MG tablet Take 2 tablets (650 mg total) by mouth every 6 (six) hours as needed for mild pain (or Fever >/= 101). 01/02/22  Yes Darrick Meigs, Marge Duncans, MD  ascorbic acid (VITAMIN C) 500 MG tablet Take 500 mg by mouth daily.   Yes [provider]  CALCIUM PO Take 1,200 mg by mouth daily.   Yes [provider]  Cholecalciferol (VITAMIN D-3 PO) Take 3 tablets by mouth every morning. 3000 units total   Yes [provider]  clopidogrel (PLAVIX) 75 MG tablet TAKE 1 TABLET EVERY MORNING 08/31/21  Yes Josue Hector, MD  denosumab (PROLIA) 60 MG/ML SOSY injection Inject 60 mg into the skin every 6 (six) months.   Yes Celene Squibb, MD  DENTA 5000 PLUS 1.1 % CREA dental cream SMARTSIG:Sparingly By Mouth Daily 06/05/22  Yes [provider]  ELIQUIS 2.5  MG TABS tablet TAKE 1 TABLET TWICE A DAY 11/23/21  Yes Josue Hector, MD  Ferrous Sulfate (IRON PO) Take 65 mg by mouth daily at 6 (six) AM.   Yes [provider]  furosemide (LASIX) 20 MG tablet Take 1 tablet (20 mg total) by mouth daily. Only as needed PRN 05/27/22  Yes Johnson, Clanford L, MD  MAGNESIUM OXIDE PO Take 500 mg by mouth every morning.   Yes [provider]  metoprolol tartrate (LOPRESSOR) 25 MG tablet TAKE 3 TABLETS TWICE A DAY 10/06/21  Yes Josue Hector, MD  Multiple Vitamin (MULTIVITAMIN WITH MINERALS) TABS tablet Take 1 tablet by mouth every morning.   Yes [provider]  pantoprazole (PROTONIX) 40 MG tablet TAKE 1 TABLET DAILY 08/31/21  Yes Josue Hector, MD  potassium chloride (KLOR-CON M) 10 MEQ tablet Take 1 tablet (10 mEq total) by mouth daily. 05/26/22  Yes Johnson, Clanford L, MD  pravastatin (PRAVACHOL) 40 MG tablet Take 1 tablet (40 mg total) by mouth daily. 07/20/21  Yes Josue Hector, MD  umeclidinium  bromide (INCRUSE ELLIPTA) 62.5 MCG/ACT AEPB Inhale 1 puff into the lungs daily.   Yes [provider]  SANTYL 250 UNIT/GM ointment APPLY TO AFFECTED AREAS ONCE DAILY. 03/30/22   Felipa Furnace, DPM    Physical Exam: Vitals:   06/18/22 0742 06/18/22 1045 06/18/22 1100  BP:  121/81 133/82  Pulse:  (!) 113   Resp:  16 18  SpO2:  93%   Weight: 40.8 kg    Height: '5\' 1"'$  (1.549 m)    GENERAL:  A&O x 3, NAD, well developed, cooperative, follows commands HEENT: [place]/AT, No thrush, No icterus, No oral ulcers Neck:  No neck mass, No meningismus, soft, supple CV: RRR, no S3, no S4, no rub, no JVD Lungs:  bilateral crackles.  No wheeze Abd: soft/NT +BS, nondistended Ext: trace LE edema, no lymphangitis, no cyanosis, no rashes;  left ankle wound without pus or necrosis Neuro:  CN II-XII intact, strength 4/5 in RUE, RLE, strength 4/5 LUE, LLE; sensation intact bilateral; no dysmetria; babinski equivocal  Data Reviewed: Data reviewed above in history  Assessment and Plan: Sepsis -due to pneumonia -presented with leukocytosis and tachycardia -check PCT -continue ceftriaxone and azithro  Acute respiratory failure with hypoxia -presented with saturation in upper 80s on RA and tachypnea -due to pneumonia and pulmonary edema -stable on 3L -wean to RA -3/11 CT chest--bilateral septal thickening with trace right and small left pleural effusion  Acute on chronic HFrEF -05/24/2022 echo EF 40 to 45%, global HK, normal RVEF, PASP 39.7, mild to moderate MR -Give Lasix 40 mill IV x 1 and reevaluate.  12.4 -Continue metoprolol -accuratee I/Os  Aspiration pneumonitis -debris noted in airway on CT chest -speech therapy eval -continue IV abx  Persistent atrial fibrillation -Continue metoprolol -Continue apixaban -Patient was not felt to be a good candidate for antiarrhythmic therapy secondary to her multiple comorbidities  COPD/bronchiectasis -Quit smoking 30 years ago -Not on  supplemental oxygen at home -Start bronchodilators  Cavitary lung nodule -discussed with pt and spouse -they are not interested in any further work up  Essential hypertension -Continue metoprolol  PAD (peripheral artery disease) ([place]) --01/15/2022 ABIs--normal bilateral -Continue follow-up with [facility] -Wound care service consultation for recommendations --01/15/2022 ABIs--normal bilateral   Hyperlipidemia -Continue statin  Essential tremor -Outpatient follow-up with neurology -Hold mysoline with which pt agrees due to interaction with apixaban -Outpatient follow-up with Dr. Carles Collet  History of  breast cancer -Status post bilateral mastectomy, chemo and radiation -Appears to be currently in remission -Continue outpatient follow-up screening.   Severe osteopenia/osteoporosis -Continue outpatient treatment with Prolia after discharge.   Advance Care Planning: DNR--confirmed with pt and spouse  Consults: none  Family Communication: spouse 3/11  Severity of Illness: The appropriate patient status for this patient is INPATIENT. Inpatient status is judged to be reasonable and necessary in order to provide the required intensity of service to ensure the patient's safety. The patient's presenting symptoms, physical exam findings, and initial radiographic and laboratory data in the context of their chronic comorbidities is felt to place them at high risk for further clinical deterioration. Furthermore, it is not anticipated that the patient will be medically stable for discharge from the hospital within 2 midnights of admission.   * I certify that at the point of admission it is my clinical judgment that the patient will require inpatient hospital care spanning beyond 2 midnights from the point of admission due to high intensity of service, high risk for further deterioration and high frequency of surveillance required.*  Author: Orson Eva, MD 06/18/2022 11:22 AM  For on call review  [email or web address].

## 2022-06-18 NOTE — Hospital Course (Signed)
82 year old female with a history of persistent atrial fibrillation, COPD/bronchiectasis, hypertension, hyperlipidemia, peripheral arterial disease, HFrEF, and essential tremor presenting with shortness of breath that began in the morning of 06/18/2022.  The patient states that she woke up at her usual time around 430 and drink some coffee.  About an hour afterwards, she began having some shortness of breath that had gradually worsened with an episode of nausea and vomiting.  Over the past week, the patient has felt some fluttering sensation in her chest with associated shortness of breath.  She denies any frank orthopnea or worsening lower extremity edema.  She denies any chest pain, headache, neck pain, hematemesis, abdominal pain, diarrhea, dysuria, hematuria.  She has a nonproductive cough.  She has some subjective fevers and chills on the morning of admission with some worsening of her essential tremor.  The patient recently followed up that the A-fib clinic on 06/11/2022.  In general, she has not felt to be a good candidate for antiarrhythmic therapy due to her multiple comorbidities.  In addition, the patient was initially admitted to the hospital from 03/05/2022 to 03/09/2022 due to Acute HFrEF.  She was discharged home with furosemide 20 mg daily and metoprolol heart rate. In the ED, the patient was afebrile and hemodynamically stable with oxygen saturation in the upper 80s on room air.  She was placed on 3 L with saturation 93-94%.  WBC 20.0, hemoglobin 10.9, platelets 271,000.  Sodium 138, potassium 4.8, bicarbonate 16, serum creatinine 1.12.  The patient was started on ceftriaxone and azithromycin.  CT of the chest showed bilateral septal thickening with trace right and small left pleural effusion.  There was debris in the airways.  There is a cavitary LLL nodule.  This was adjacent to her previous established stable 3 mm LLL nodule.  There is indeterminate age T5 compression fracture.

## 2022-06-18 NOTE — ED Provider Notes (Signed)
[name] Provider Note   CSN: [id number] Arrival date & time: 06/18/22  [id number]     History  Chief Complaint  Patient presents with   Shortness of Breath    [name] is a 82 y.o. female.  Pt is a 82 yo with pmhx significant for afib (on Eliquis), HTN, HLD, CVA, neuropathy, PAD, COPD, GERD, malnutrition, tremors and CHF.  Pt woke up around 0430 fine.  She had a cup of coffee and then developed sob and n/v around 0530.  She called EMS who put her on a NRB (unclear why).  Pt feels very cold.  No pain.  No fevers.           Home Medications Prior to Admission medications   Medication Sig Start Date End Date Taking? Authorizing Provider  acetaminophen (TYLENOL) 325 MG tablet Take 2 tablets (650 mg total) by mouth every 6 (six) hours as needed for mild pain (or Fever >/= 101). 01/02/22  Yes Darrick Meigs, Marge Duncans, MD  ascorbic acid (VITAMIN C) 500 MG tablet Take 500 mg by mouth daily.   Yes [provider]  CALCIUM PO Take 1,200 mg by mouth daily.   Yes [provider]  Cholecalciferol (VITAMIN D-3 PO) Take 3 tablets by mouth every morning. 3000 units total   Yes [provider]  clopidogrel (PLAVIX) 75 MG tablet TAKE 1 TABLET EVERY MORNING 08/31/21  Yes Josue Hector, MD  denosumab (PROLIA) 60 MG/ML SOSY injection Inject 60 mg into the skin every 6 (six) months.   Yes Celene Squibb, MD  DENTA 5000 PLUS 1.1 % CREA dental cream SMARTSIG:Sparingly By Mouth Daily 06/05/22  Yes [provider]  ELIQUIS 2.5 MG TABS tablet TAKE 1 TABLET TWICE A DAY 11/23/21  Yes Josue Hector, MD  Ferrous Sulfate (IRON PO) Take 65 mg by mouth daily at 6 (six) AM.   Yes [provider]  furosemide (LASIX) 20 MG tablet Take 1 tablet (20 mg total) by mouth daily. Only as needed PRN 05/27/22  Yes Johnson, Clanford L, MD  MAGNESIUM OXIDE PO Take 500 mg by mouth every morning.   Yes [provider]  metoprolol tartrate  (LOPRESSOR) 25 MG tablet TAKE 3 TABLETS TWICE A DAY 10/06/21  Yes Josue Hector, MD  Multiple Vitamin (MULTIVITAMIN WITH MINERALS) TABS tablet Take 1 tablet by mouth every morning.   Yes [provider]  pantoprazole (PROTONIX) 40 MG tablet TAKE 1 TABLET DAILY 08/31/21  Yes Josue Hector, MD  potassium chloride (KLOR-CON M) 10 MEQ tablet Take 1 tablet (10 mEq total) by mouth daily. 05/26/22  Yes Johnson, Clanford L, MD  pravastatin (PRAVACHOL) 40 MG tablet Take 1 tablet (40 mg total) by mouth daily. 07/20/21  Yes Josue Hector, MD  umeclidinium bromide (INCRUSE ELLIPTA) 62.5 MCG/ACT AEPB Inhale 1 puff into the lungs daily.   Yes [provider]  SANTYL 250 UNIT/GM ointment APPLY TO AFFECTED AREAS ONCE DAILY. 03/30/22   Felipa Furnace, DPM      Allergies    Iodine, Iohexol, Sulfa antibiotics, Petroleum gauze non-woven 3x9" [wound dressings], Povidone-iodine, Wound dressing adhesive, and Tape    Review of Systems   Review of Systems  Respiratory:  Positive for shortness of breath.   Gastrointestinal:  Positive for nausea and vomiting.  All other systems reviewed and are negative.   Physical Exam Updated Vital Signs Ht '5\' 1"'$  (1.549 m)   Wt 40.8  kg   BMI 17.01 kg/m  Physical Exam Vitals and nursing note reviewed.  Constitutional:      Appearance: She is well-developed. She is ill-appearing.  HENT:     Head: Normocephalic and atraumatic.     Mouth/Throat:     Mouth: Mucous membranes are moist.     Pharynx: Oropharynx is clear.  Eyes:     Extraocular Movements: Extraocular movements intact.     Pupils: Pupils are equal, round, and reactive to light.  Cardiovascular:     Rate and Rhythm: Normal rate. Rhythm irregular.  Pulmonary:     Effort: Tachypnea present.     Breath sounds: Rhonchi present.  Abdominal:     General: Bowel sounds are normal.     Palpations: Abdomen is soft.  Musculoskeletal:        General: Normal range of motion.     Cervical back:  Normal range of motion and neck supple.  Skin:    General: Skin is warm.     Capillary Refill: Capillary refill takes less than 2 seconds.     Comments: Non-healing wound left ankle.  See picture.  Neurological:     General: No focal deficit present.     Mental Status: She is alert and oriented to person, place, and time.     Motor: Tremor present.  Psychiatric:        Mood and Affect: Mood normal.        Behavior: Behavior normal.     ED Results / Procedures / Treatments   Labs (all labs ordered are listed, but only abnormal results are displayed) Labs Reviewed  BASIC METABOLIC PANEL - Abnormal; Notable for the following components:      Result Value   CO2 16 (*)    Glucose, Bld 134 (*)    BUN 33 (*)    Creatinine, Ser 1.12 (*)    Calcium 8.5 (*)    GFR, Estimated 48 (*)    All other components within normal limits  BRAIN NATRIURETIC PEPTIDE - Abnormal; Notable for the following components:   B Natriuretic Peptide 866.0 (*)    All other components within normal limits  CBC WITH DIFFERENTIAL/PLATELET - Abnormal; Notable for the following components:   WBC 20.0 (*)    RBC 3.43 (*)    Hemoglobin 10.9 (*)    MCV 105.8 (*)    RDW 17.6 (*)    Neutro Abs 18.0 (*)    Abs Immature Granulocytes 0.14 (*)    All other components within normal limits  HEPATIC FUNCTION PANEL - Abnormal; Notable for the following components:   Albumin 3.2 (*)    AST 60 (*)    All other components within normal limits  RESP PANEL BY RT-PCR (RSV, FLU A&B, COVID)  [id number]  CULTURE, BLOOD (ROUTINE X 2)  CULTURE, BLOOD (ROUTINE X 2)  LACTIC ACID, PLASMA  LACTIC ACID, PLASMA  TROPONIN I (HIGH SENSITIVITY)    EKG EKG Interpretation  Date/Time:  Monday June 18 2022 08:03:10 EDT Ventricular Rate:  86 PR Interval:    QRS Duration: 80 QT Interval:  417 QTC Calculation: 499 R Axis:   84 Text Interpretation: Atrial flutter with predominant 4:1 AV block Borderline right axis deviation Anteroseptal  infarct, old Borderline repolarization abnormality Confirmed by Isla Pence [phone]) on 06/18/2022 8:28:41 AM  Radiology CT CHEST ABDOMEN PELVIS WO CONTRAST  Result Date: 06/18/2022 CLINICAL DATA:  Shortness of breath and nausea. EXAM: CT CHEST, ABDOMEN AND PELVIS WITHOUT CONTRAST TECHNIQUE: Multidetector  CT imaging of the chest, abdomen and pelvis was performed following the standard protocol without IV contrast. RADIATION DOSE REDUCTION: This exam was performed according to the departmental dose-optimization program which includes automated exposure control, adjustment of the mA and/or kV according to patient size and/or use of iterative reconstruction technique. COMPARISON:  CT chest 07/11/2015. FINDINGS: CT CHEST FINDINGS Cardiovascular: Atherosclerotic calcification of the aorta, aortic valve and coronary arteries. Heart is enlarged. No pericardial effusion. Mediastinum/Nodes: Mediastinal lymph nodes measure up to 10 mm in the low right paratracheal station, as before. Hilar regions are difficult to evaluate without IV contrast. Calcified subcarinal lymph node. Surgical clips in the left axilla. No axillary adenopathy. Esophagus is grossly unremarkable. Lungs/Pleura: Pleuroparenchymal scarring in the apical right upper lobe. Centrilobular emphysema. Additional pleuroparenchymal scarring the lower right hemithorax. Basilar septal thickening with trace right and small left pleural effusions. Small cavitary nodule in the left lower lobe measures 0.9 x 1.1 cm (3/106), new from 07/11/2015. Adjacent 3 mm lateral left lower lobe nodule (3/109), unchanged. Per Fleischner Society guidelines, no follow-up is necessary. Left lower lobe dependent atelectasis. Cylindrical bronchiectasis. Debris is seen in the airway. Musculoskeletal: Degenerative changes in the spine. Old T10 and T12 compression fractures. T5 superior endplate compression fracture, age indeterminate. No worrisome lytic or sclerotic lesions. CT  ABDOMEN PELVIS FINDINGS Hepatobiliary: Liver is unremarkable. Sludge or stones in the gallbladder. No biliary ductal dilatation. Pancreas: Negative. Spleen: Negative. Adrenals/Urinary Tract: Adrenal glands are unremarkable. Stones in the kidneys bilaterally. Low-attenuation lesion in the lower pole right kidney. No specific follow-up necessary. Kidneys are otherwise unremarkable. Ureters are decompressed. Bladder is grossly unremarkable. Stomach/Bowel: Moderate hiatal hernia. Stomach, small bowel, appendix and colon are unremarkable. Vascular/Lymphatic: Atherosclerotic calcification of the aorta. Left inguinal lymph nodes measure up to 1.3 cm. No additional pathologically enlarged lymph nodes. Reproductive: Calcified uterine fibroids.  No adnexal mass. Other: No free fluid. Small right inguinal hernia contains fat. Mesenteries and peritoneum are unremarkable. Musculoskeletal: Old right pubic rami fractures. Osteopenia. Degenerative changes in the spine. Severe dextroconvex scoliosis of the lumbar spine. Degenerative changes in the hips. No worrisome lytic or sclerotic lesions. IMPRESSION: 1. Cavitary left lower lobe nodule is new from 07/11/2015 and worrisome for malignancy. An infectious etiology could also have this appearance. Recommend short-term follow-up CT chest without contrast in 3-4 weeks in further initial evaluation. 2. Congestive heart failure. 3. Borderline left inguinal adenopathy, possibly reactive. 4. Cylindrical bronchiectasis. 5. Gallbladder sludge and/or stones. 6. Bilateral renal stones. 7. Moderate hiatal hernia. 8. Age-indeterminate T5 superior endplate compression fracture. 9. Aortic atherosclerosis (ICD10-I70.0). 10.  Emphysema (ICD10-J43.9). Electronically Signed   By: Lorin Picket M.D.   On: 06/18/2022 09:25   DG Chest Port 1 View  Result Date: 06/18/2022 CLINICAL DATA:  82 year old female who woke at 0400 hours with chills and shortness of breath. EXAM: PORTABLE CHEST 1 VIEW  COMPARISON:  Portable chest 05/25/2022 and earlier. FINDINGS: Portable AP upright view at 0735 hours. Chronic hyperinflation. Chronic right lung bronchiectasis and scarring demonstrated by CT in 2017. Some centrilobular emphysema also evident at that time. Mildly lower lung volumes today. Calcified aortic atherosclerosis. Stable cardiac size and mediastinal contours. Cardiac size within normal limits. No pneumothorax, pulmonary edema, pleural effusion or acute pulmonary opacity. Visualized tracheal air column is within normal limits. Osteopenia. No acute osseous abnormality identified. Left axillary no dissection clips are stable. IMPRESSION: Chronic lung disease with Bronchiectasis, Emphysema (ICD10-J43.9), scarring. No acute cardiopulmonary abnormality. Electronically Signed   By: Herminio Heads.D.  On: 06/18/2022 07:44    Procedures Procedures    Medications Ordered in ED Medications  collagenase (SANTYL) ointment (has no administration in time range)  cefTRIAXone (ROCEPHIN) 1 g in sodium chloride 0.9 % 100 mL IVPB (1 g Intravenous New Bag/Given 06/18/22 1049)  azithromycin (ZITHROMAX) 500 mg in sodium chloride 0.9 % 250 mL IVPB (has no administration in time range)  ondansetron (ZOFRAN) injection 4 mg (4 mg Intravenous Given 06/18/22 0816)  morphine (PF) 4 MG/ML injection 4 mg (4 mg Intravenous Given 06/18/22 [id number])    ED Course/ Medical Decision Making/ A&P                             Medical Decision Making Amount and/or Complexity of Data Reviewed Labs: ordered. Radiology: ordered.  Risk Prescription drug management.   This patient presents to the ED for concern of sob, this involves an extensive number of treatment options, and is a complaint that carries with it a high risk of complications and morbidity.  The differential diagnosis includes chf exac, copd exac, pna, covid/flu/rsv, afib   Co morbidities that complicate the patient evaluation  afib (on Eliquis), HTN, HLD, CVA,  neuropathy, PAD, COPD, GERD, malnutrition, tremors and CHF   Additional history obtained:  Additional history obtained from epic chart review External records from outside source obtained and reviewed including EMS report   Lab Tests:  I Ordered, and personally interpreted labs.  The pertinent results include:  wbc elevated at 20, hgb 10.9 (chronic anemia); bmp with CO2 low at 16, bun 33 and cr 1.12 (chronic ckd); covid/flu neg   Imaging Studies ordered:  I ordered imaging studies including cxr and ct chest/abd/pelvis I independently visualized and interpreted imaging which showed  CXR: Chronic lung disease with Bronchiectasis, Emphysema (ICD10-J43.9),  scarring. No acute cardiopulmonary abnormality.  CT chest/abd/pelvis: Cavitary left lower lobe nodule is new from 07/11/2015 and  worrisome for malignancy. An infectious etiology could also have  this appearance. Recommend short-term follow-up CT chest without  contrast in 3-4 weeks in further initial evaluation.  2. Congestive heart failure.  3. Borderline left inguinal adenopathy, possibly reactive.  4. Cylindrical bronchiectasis.  5. Gallbladder sludge and/or stones.  6. Bilateral renal stones.  7. Moderate hiatal hernia.  8. Age-indeterminate T5 superior endplate compression fracture.  9. Aortic atherosclerosis (ICD10-I70.0).  10.  Emphysema (ICD10-J43.9).   I agree with the radiologist interpretation   Cardiac Monitoring:  The patient was maintained on a cardiac monitor.  I personally viewed and interpreted the cardiac monitored which showed an underlying rhythm of: afib   Medicines ordered and prescription drug management:  I ordered medication including zofran and morphine  for nausea and pain  Reevaluation of the patient after these medicines showed that the patient improved I have reviewed the patients home medicines and have made adjustments as needed   Test Considered:  ct   Critical  Interventions:  oxygen   Consultations Obtained:  I requested consultation with the hospitalist (Dr. Carles Collet),  and discussed lab and imaging findings as well as pertinent plan - he will admit   Problem List / ED Course:  SOB:  possible pna.  Pt's wbc is elevated.  Pt started on rocephin and zithromax.  She is on 4L for comfort. Lung Nodule:  new from 2017.  CA vs PNA.  Ankle wound:  looks pretty good.  Husband has been changing dressings and applying Santyl cream. GB sludge and stones:  no pain in the RUQ.  LFTs nl.   Reevaluation:  After the interventions noted above, I reevaluated the patient and found that they have :improved   Social Determinants of Health:  Lives at home   Dispostion:  After consideration of the diagnostic results and the patients response to treatment, I feel that the patent would benefit from admission.    CRITICAL CARE Performed by: Isla Pence   Total critical care time: 30 minutes  Critical care time was exclusive of separately billable procedures and treating other patients.  Critical care was necessary to treat or prevent imminent or life-threatening deterioration.  Critical care was time spent personally by me on the following activities: development of treatment plan with patient and/or surrogate as well as nursing, discussions with consultants, evaluation of patient's response to treatment, examination of patient, obtaining history from patient or surrogate, ordering and performing treatments and interventions, ordering and review of laboratory studies, ordering and review of radiographic studies, pulse oximetry and re-evaluation of patient's condition.         Final Clinical Impression(s) / ED Diagnoses Final diagnoses:  Lung nodule  Acute on chronic congestive heart failure, unspecified heart failure type (HCC)  Ankle wound, left, initial encounter  Calculus of gallbladder without cholecystitis without obstruction  Stage 3a  chronic kidney disease ([place])    Rx / DC Orders ED Discharge Orders     None         Isla Pence, MD 06/18/22 1051

## 2022-06-18 NOTE — Progress Notes (Addendum)
Date and time results received: 06/18/22 1446  Test: lactic Acid Critical Value: 4.3  Name of Provider Notified: Tat  Orders Received? Or Actions Taken?: awaiting response

## 2022-06-19 DIAGNOSIS — J9601 Acute respiratory failure with hypoxia: Secondary | ICD-10-CM | POA: Diagnosis not present

## 2022-06-19 LAB — BASIC METABOLIC PANEL
Anion gap: 6 (ref 5–15)
BUN: 34 mg/dL — ABNORMAL HIGH (ref 8–23)
CO2: 21 mmol/L — ABNORMAL LOW (ref 22–32)
Calcium: 7.4 mg/dL — ABNORMAL LOW (ref 8.9–10.3)
Chloride: 104 mmol/L (ref 98–111)
Creatinine, Ser: 1.28 mg/dL — ABNORMAL HIGH (ref 0.44–1.00)
GFR, Estimated: 41 mL/min — ABNORMAL LOW (ref >60)
Glucose, Bld: 87 mg/dL (ref 70–99)
Potassium: 3.7 mmol/L (ref 3.5–5.1)
Sodium: 131 mmol/L — ABNORMAL LOW (ref 135–145)

## 2022-06-19 LAB — CBC
HCT: 26.7 % — ABNORMAL LOW (ref 36.0–46.0)
Hemoglobin: 8.3 g/dL — ABNORMAL LOW (ref 12.0–15.0)
MCH: 31.2 pg (ref 26.0–34.0)
MCHC: 31.1 g/dL (ref 30.0–36.0)
MCV: 100.4 fL — ABNORMAL HIGH (ref 80.0–100.0)
Platelets: 189 10*3/uL (ref 150–400)
RBC: 2.66 MIL/uL — ABNORMAL LOW (ref 3.87–5.11)
RDW: 17.5 % — ABNORMAL HIGH (ref 11.5–15.5)
WBC: 15.1 10*3/uL — ABNORMAL HIGH (ref 4.0–10.5)
nRBC: 0 % (ref 0.0–0.2)

## 2022-06-19 LAB — MAGNESIUM: Magnesium: 1.9 mg/dL (ref 1.7–2.4)

## 2022-06-19 MED ORDER — FUROSEMIDE 10 MG/ML IJ SOLN
40.0000 mg | Freq: Every day | INTRAMUSCULAR | Status: DC
  Administered 2022-06-19: 40 mg via INTRAVENOUS
  Filled 2022-06-19: qty 4

## 2022-06-19 MED ORDER — SODIUM CHLORIDE 0.9 % IV BOLUS
500.0000 mL | Freq: Once | INTRAVENOUS | Status: AC
  Administered 2022-06-19: 500 mL via INTRAVENOUS

## 2022-06-19 NOTE — Progress Notes (Signed)
Mobility Specialist Progress Note:    06/19/22 1400  Mobility  Activity Ambulated with assistance in hallway  Level of Assistance Standby assist, set-up cues, supervision of patient - no hands on  Assistive Device Front wheel walker  Distance Ambulated (ft) 240 ft  Activity Response Tolerated well  Mobility Referral Yes  $Mobility charge 1 Mobility   Pt agreeable to mobility session, received in bed. Tolerated well,asx throughout. Returned pt to room, sitting up in chair, call bell and phone in reach, all needs met.   Royetta Crochet Mobility Specialist Please contact via Solicitor or  Rehab office at [phone]

## 2022-06-19 NOTE — Progress Notes (Signed)
Ambulated from chair to bathroom with walker and standby.  Now back in bed.

## 2022-06-19 NOTE — Progress Notes (Signed)
   06/19/22 0929  ReDS Vest / Clip  Station Marker A  Ruler Value 23  ReDS Value Range < 36  ReDS Actual Value 26

## 2022-06-19 NOTE — Progress Notes (Signed)
Dressing changed to left lower extremity, santyl, wet and then dry nonadherent and wrapped in gauze, per patient's home regimen.  Assisted to chair with standby.  No complaints of sob, continues on 2 liters

## 2022-06-19 NOTE — Progress Notes (Signed)
PROGRESS NOTE     [name], is a 82 y.o. female, DOB - [birth year], [id number]  Admit date - 06/18/2022   Admitting Physician Orson Eva, MD  Outpatient Primary MD for the patient is Celene Squibb, MD  LOS - 1  Chief Complaint  Patient presents with   Shortness of Breath        Brief Narrative:  82 year old female with a history of persistent atrial fibrillation, COPD/bronchiectasis, hypertension, hyperlipidemia, peripheral arterial disease, HFrEF, and essential tremor admitted on 06/18/22 with CHF exacerbation sepsis secondary to pneumonia with acute hypoxic respiratory failure    -Assessment and Plan: 1)Sepsis --due to pneumonia--POA WBC 20.0 >> 15.1 --continue Zosyn and azithro   Acute respiratory failure with hypoxia--POA -presented with saturation in upper 80s on RA and tachypnea -due to pneumonia and pulmonary edema -She has been weaned down to 2 L of oxygen via nasal cannula at this time   Acute on chronic HFrEF -05/24/2022 echo EF 40 to 45%, global HK, normal RVEF, PASP 39.7, mild to moderate MR -c/n Iv Lasix -Continue metoprolol -accuratee I/Os  -Left lower extremity cellulitis--improving slowly, WBC trending down, antibiotics as above #1 -See photos in epic   Aspiration pneumonitis -debris noted in airway on CT chest -speech therapy eval appreciated recommends regular solids and thin liquids -continue IV abx as above #1   Persistent atrial fibrillation -Continue metoprolol -Continue apixaban for stroke prophylaxis -Patient was not felt to be a good candidate for antiarrhythmic therapy secondary to her multiple comorbidities   COPD/bronchiectasis -Quit smoking 30 years ago -Not on supplemental oxygen at home -Start bronchodilators   Cavitary left lower lobe nodule -discussed with pt and spouse -they are not interested in any further work up -Outpatient CT recommended in about 3 to 4 weeks if they decide   Essential hypertension -Continue  metoprolol   PAD (peripheral artery disease) ([place]) --01/15/2022 ABIs--normal bilateral -Continue follow-up with [facility] -Wound care service consultation for recommendations --01/15/2022 ABIs--normal bilateral    Hyperlipidemia -Continue statin   Essential tremor -Outpatient follow-up with neurology -Hold mysoline with which pt agrees due to interaction with apixaban -Outpatient follow-up with Dr. Carles Collet   History of breast cancer -Status post bilateral mastectomy, chemo and radiation -Appears to be currently in remission -Continue outpatient follow-up screening.   Severe osteopenia/osteoporosis -Continue outpatient treatment with Prolia after discharge.   Generalized weakness and deconditioning--- physical therapy evaluation requested   Status is: Inpatient   Disposition: The patient is from: home              Anticipated d/c is to: Home              Anticipated d/c date is: 2 days              Patient currently is not medically stable to d/c. Barriers: Not Clinically Stable-   Code Status :  -  Code Status: DNR   Family Communication:    NA (patient is alert, awake and coherent)   DVT Prophylaxis  :   - SCDs \ apixaban (ELIQUIS) tablet 2.5 mg Start: 06/18/22 2000 apixaban (ELIQUIS) tablet 2.5 mg   Lab Results  Component Value Date   PLT 189 06/19/2022    Inpatient Medications  Scheduled Meds:  apixaban  2.5 mg Oral BID   ascorbic acid  500 mg Oral Daily   clopidogrel  75 mg Oral q morning   collagenase   Topical Daily   metoprolol tartrate  75 mg Oral BID  multivitamin with minerals  1 tablet Oral q morning   pantoprazole  40 mg Oral Daily   pravastatin  40 mg Oral Daily   sodium chloride flush  3 mL Intravenous Q12H   Continuous Infusions:  sodium chloride 250 mL (06/19/22 1029)   azithromycin 500 mg (06/19/22 1032)   piperacillin-tazobactam (ZOSYN)  IV 3.375 g (06/19/22 1601)   PRN Meds:.sodium chloride, acetaminophen, ondansetron (ZOFRAN) IV, sodium  chloride flush   Anti-infectives (From admission, onward)    Start     Dose/Rate Route Frequency Ordered Stop   06/19/22 1000  cefTRIAXone (ROCEPHIN) 1 g in sodium chloride 0.9 % 100 mL IVPB  Status:  Discontinued        1 g 200 mL/hr over 30 Minutes Intravenous Every 24 hours 06/18/22 1218 06/18/22 1514   06/18/22 1600  piperacillin-tazobactam (ZOSYN) IVPB 3.375 g        3.375 g 12.5 mL/hr over 240 Minutes Intravenous Every 8 hours 06/18/22 1514     06/18/22 0945  cefTRIAXone (ROCEPHIN) 1 g in sodium chloride 0.9 % 100 mL IVPB        1 g 200 mL/hr over 30 Minutes Intravenous  Once 06/18/22 0944 06/18/22 1100   06/18/22 0945  azithromycin (ZITHROMAX) 500 mg in sodium chloride 0.9 % 250 mL IVPB        500 mg 250 mL/hr over 60 Minutes Intravenous Every 24 hours 06/18/22 0944           Subjective: [name] today has no fevers, no emesis,  No chest pain,   -Cough and dyspnea improving slowly -Oxygen requirement down to 2 L per nasal cannula no conversational dyspnea the patient has dyspnea on exertion  Objective: Vitals:   06/19/22 0432 06/19/22 0821 06/19/22 1315 06/19/22 1346  BP: (!) 101/58 97/61 115/68 109/64  Pulse: 93 93 98 94  Resp: 18  (!) 22 17  Temp: 98.1 F (36.7 C)  98.1 F (36.7 C)   TempSrc: Oral  Oral   SpO2:   97% 99%  Weight:  45 kg    Height:  '5\' 1"'$  (1.549 m)      Intake/Output Summary (Last 24 hours) at 06/19/2022 1902 Last data filed at 06/19/2022 1730 Gross per 24 hour  Intake 1559.35 ml  Output --  Net 1559.35 ml   Filed Weights   06/18/22 0742 06/18/22 1205 06/19/22 0821  Weight: 40.8 kg 43.9 kg 45 kg    Physical Exam  Gen:- Awake Alert, no conversational dyspnea the patient has dyspnea on exertion HEENT:- [place].AT, No sclera icterus Nose- [place] 2L/min Neck-Supple Neck,No JVD,.  Lungs-diminished breath sounds, few scattered rhonchi  CV- S1, S2 normal, regular  Abd-  +ve B.Sounds, Abd Soft, No tenderness,    Extremity/Skin:- +ve  edema,  pedal pulses present , left lower extremity open wounds with cellulitis--See photos in epic Psych-affect is appropriate, oriented x3 Neuro-generalized weakness, no new focal deficits, no tremors  Data Reviewed: I have personally reviewed following labs and imaging studies  CBC: Recent Labs  Lab 06/18/22 0752 06/19/22 0451  WBC 20.0* 15.1*  NEUTROABS 18.0*  --   HGB 10.9* 8.3*  HCT 36.3 26.7*  MCV 105.8* 100.4*  PLT 271 [id number]   Basic Metabolic Panel: Recent Labs  Lab 06/18/22 0752 06/19/22 0451  NA 138 131*  K 4.8 3.7  CL 107 104  CO2 16* 21*  GLUCOSE 134* 87  BUN 33* 34*  CREATININE 1.12* 1.28*  CALCIUM 8.5* 7.4*  MG  --  1.9   GFR: Estimated Creatinine Clearance: 23.2 mL/min (A) (by C-G formula based on SCr of 1.28 mg/dL (H)). Liver Function Tests: Recent Labs  Lab 06/18/22 0752  AST 60*  ALT 32  ALKPHOS 78  BILITOT 0.9  PROT 7.8  ALBUMIN 3.2*   Recent Results (from the past 240 hour(s))  Resp panel by RT-PCR (RSV, Flu A&B, Covid) Anterior Nasal Swab     Status: None   Collection Time: 06/18/22  8:06 AM   Specimen: Anterior Nasal Swab  Result Value Ref Range Status   SARS Coronavirus 2 by RT PCR NEGATIVE NEGATIVE Final    Comment: (NOTE) SARS-CoV-2 target nucleic acids are NOT DETECTED.  The SARS-CoV-2 RNA is generally detectable in upper respiratory specimens during the acute phase of infection. The lowest concentration of SARS-CoV-2 viral copies this assay can detect is 138 copies/mL. A negative result does not preclude SARS-Cov-2 infection and should not be used as the sole basis for treatment or other patient management decisions. A negative result may occur with  improper specimen collection/handling, submission of specimen other than nasopharyngeal swab, presence of viral mutation(s) within the areas targeted by this assay, and inadequate number of viral copies(<138 copies/mL). A negative result must be combined with clinical observations, patient  history, and epidemiological information. The expected result is Negative.  Fact Sheet for Patients:  EntrepreneurPulse.com.au  Fact Sheet for Healthcare Providers:  IncredibleEmployment.be  This test is no t yet approved or cleared by the [place] FDA and  has been authorized for detection and/or diagnosis of SARS-CoV-2 by FDA under an Emergency Use Authorization (EUA). This EUA will remain  in effect (meaning this test can be used) for the duration of the COVID-19 declaration under Section 564(b)(1) of the Act, 21 U.S.C.section 360bbb-3(b)(1), unless the authorization is terminated  or revoked sooner.       Influenza A by PCR NEGATIVE NEGATIVE Final   Influenza B by PCR NEGATIVE NEGATIVE Final    Comment: (NOTE) The Xpert Xpress SARS-CoV-2/FLU/RSV plus assay is intended as an aid in the diagnosis of influenza from Nasopharyngeal swab specimens and should not be used as a sole basis for treatment. Nasal washings and aspirates are unacceptable for Xpert Xpress SARS-CoV-2/FLU/RSV testing.  Fact Sheet for Patients: EntrepreneurPulse.com.au  Fact Sheet for Healthcare Providers: IncredibleEmployment.be  This test is not yet approved or cleared by the [place] FDA and has been authorized for detection and/or diagnosis of SARS-CoV-2 by FDA under an Emergency Use Authorization (EUA). This EUA will remain in effect (meaning this test can be used) for the duration of the COVID-19 declaration under Section 564(b)(1) of the Act, 21 U.S.C. section 360bbb-3(b)(1), unless the authorization is terminated or revoked.     Resp Syncytial Virus by PCR NEGATIVE NEGATIVE Final    Comment: (NOTE) Fact Sheet for Patients: EntrepreneurPulse.com.au  Fact Sheet for Healthcare Providers: IncredibleEmployment.be  This test is not yet approved or cleared by the [place] FDA  and has been authorized for detection and/or diagnosis of SARS-CoV-2 by FDA under an Emergency Use Authorization (EUA). This EUA will remain in effect (meaning this test can be used) for the duration of the COVID-19 declaration under Section 564(b)(1) of the Act, 21 U.S.C. section 360bbb-3(b)(1), unless the authorization is terminated or revoked.  Performed at [facility], [address], [place], [place] [address]   MRSA Next Gen by PCR, Nasal     Status: None   Collection Time: 06/18/22 12:55 PM   Specimen: Nasal Mucosa;  Nasal Swab  Result Value Ref Range Status   MRSA by PCR Next Gen NOT DETECTED NOT DETECTED Final    Comment: (NOTE) The GeneXpert MRSA Assay (FDA approved for NASAL specimens only), is one component of a comprehensive MRSA colonization surveillance program. It is not intended to diagnose MRSA infection nor to guide or monitor treatment for MRSA infections. Test performance is not FDA approved in patients less than 52 years old. Performed at [facility], [address] [place], [place] [address]       Radiology Studies: CT CHEST ABDOMEN PELVIS WO CONTRAST  Result Date: 06/18/2022 CLINICAL DATA:  Shortness of breath and nausea. EXAM: CT CHEST, ABDOMEN AND PELVIS WITHOUT CONTRAST TECHNIQUE: Multidetector CT imaging of the chest, abdomen and pelvis was performed following the standard protocol without IV contrast. RADIATION DOSE REDUCTION: This exam was performed according to the departmental dose-optimization program which includes automated exposure control, adjustment of the mA and/or kV according to patient size and/or use of iterative reconstruction technique. COMPARISON:  CT chest 07/11/2015. FINDINGS: CT CHEST FINDINGS Cardiovascular: Atherosclerotic calcification of the aorta, aortic valve and coronary arteries. Heart is enlarged. No pericardial effusion. Mediastinum/Nodes: Mediastinal lymph nodes measure up to 10 mm in the low right paratracheal station, as  before. Hilar regions are difficult to evaluate without IV contrast. Calcified subcarinal lymph node. Surgical clips in the left axilla. No axillary adenopathy. Esophagus is grossly unremarkable. Lungs/Pleura: Pleuroparenchymal scarring in the apical right upper lobe. Centrilobular emphysema. Additional pleuroparenchymal scarring the lower right hemithorax. Basilar septal thickening with trace right and small left pleural effusions. Small cavitary nodule in the left lower lobe measures 0.9 x 1.1 cm (3/106), new from 07/11/2015. Adjacent 3 mm lateral left lower lobe nodule (3/109), unchanged. Per Fleischner Society guidelines, no follow-up is necessary. Left lower lobe dependent atelectasis. Cylindrical bronchiectasis. Debris is seen in the airway. Musculoskeletal: Degenerative changes in the spine. Old T10 and T12 compression fractures. T5 superior endplate compression fracture, age indeterminate. No worrisome lytic or sclerotic lesions. CT ABDOMEN PELVIS FINDINGS Hepatobiliary: Liver is unremarkable. Sludge or stones in the gallbladder. No biliary ductal dilatation. Pancreas: Negative. Spleen: Negative. Adrenals/Urinary Tract: Adrenal glands are unremarkable. Stones in the kidneys bilaterally. Low-attenuation lesion in the lower pole right kidney. No specific follow-up necessary. Kidneys are otherwise unremarkable. Ureters are decompressed. Bladder is grossly unremarkable. Stomach/Bowel: Moderate hiatal hernia. Stomach, small bowel, appendix and colon are unremarkable. Vascular/Lymphatic: Atherosclerotic calcification of the aorta. Left inguinal lymph nodes measure up to 1.3 cm. No additional pathologically enlarged lymph nodes. Reproductive: Calcified uterine fibroids.  No adnexal mass. Other: No free fluid. Small right inguinal hernia contains fat. Mesenteries and peritoneum are unremarkable. Musculoskeletal: Old right pubic rami fractures. Osteopenia. Degenerative changes in the spine. Severe dextroconvex  scoliosis of the lumbar spine. Degenerative changes in the hips. No worrisome lytic or sclerotic lesions. IMPRESSION: 1. Cavitary left lower lobe nodule is new from 07/11/2015 and worrisome for malignancy. An infectious etiology could also have this appearance. Recommend short-term follow-up CT chest without contrast in 3-4 weeks in further initial evaluation. 2. Congestive heart failure. 3. Borderline left inguinal adenopathy, possibly reactive. 4. Cylindrical bronchiectasis. 5. Gallbladder sludge and/or stones. 6. Bilateral renal stones. 7. Moderate hiatal hernia. 8. Age-indeterminate T5 superior endplate compression fracture. 9. Aortic atherosclerosis (ICD10-I70.0). 10.  Emphysema (ICD10-J43.9). Electronically Signed   By: Lorin Picket M.D.   On: 06/18/2022 09:25   DG Chest Port 1 View  Result Date: 06/18/2022 CLINICAL DATA:  82 year old female who woke at  0400 hours with chills and shortness of breath. EXAM: PORTABLE CHEST 1 VIEW COMPARISON:  Portable chest 05/25/2022 and earlier. FINDINGS: Portable AP upright view at 0735 hours. Chronic hyperinflation. Chronic right lung bronchiectasis and scarring demonstrated by CT in 2017. Some centrilobular emphysema also evident at that time. Mildly lower lung volumes today. Calcified aortic atherosclerosis. Stable cardiac size and mediastinal contours. Cardiac size within normal limits. No pneumothorax, pulmonary edema, pleural effusion or acute pulmonary opacity. Visualized tracheal air column is within normal limits. Osteopenia. No acute osseous abnormality identified. Left axillary no dissection clips are stable. IMPRESSION: Chronic lung disease with Bronchiectasis, Emphysema (ICD10-J43.9), scarring. No acute cardiopulmonary abnormality. Electronically Signed   By: Genevie Ann M.D.   On: 06/18/2022 07:44     Scheduled Meds:  apixaban  2.5 mg Oral BID   ascorbic acid  500 mg Oral Daily   clopidogrel  75 mg Oral q morning   collagenase   Topical Daily    metoprolol tartrate  75 mg Oral BID   multivitamin with minerals  1 tablet Oral q morning   pantoprazole  40 mg Oral Daily   pravastatin  40 mg Oral Daily   sodium chloride flush  3 mL Intravenous Q12H   Continuous Infusions:  sodium chloride 250 mL (06/19/22 1029)   azithromycin 500 mg (06/19/22 1032)   piperacillin-tazobactam (ZOSYN)  IV 3.375 g (06/19/22 1601)     LOS: 1 day    Roxan Hockey M.D on 06/19/2022 at 7:02 PM  Go to [email or web address] - for contact info  [facility] - Office  [phone]  If 7PM-7AM, please contact night-coverage [email or web address] 06/19/2022, 7:02 PM

## 2022-06-20 DIAGNOSIS — J9601 Acute respiratory failure with hypoxia: Secondary | ICD-10-CM | POA: Diagnosis not present

## 2022-06-20 LAB — CBC
HCT: 27.7 % — ABNORMAL LOW (ref 36.0–46.0)
Hemoglobin: 8.7 g/dL — ABNORMAL LOW (ref 12.0–15.0)
MCH: 31.4 pg (ref 26.0–34.0)
MCHC: 31.4 g/dL (ref 30.0–36.0)
MCV: 100 fL (ref 80.0–100.0)
Platelets: 209 10*3/uL (ref 150–400)
RBC: 2.77 MIL/uL — ABNORMAL LOW (ref 3.87–5.11)
RDW: 17.4 % — ABNORMAL HIGH (ref 11.5–15.5)
WBC: 8.6 10*3/uL (ref 4.0–10.5)
nRBC: 0 % (ref 0.0–0.2)

## 2022-06-20 LAB — BASIC METABOLIC PANEL
Anion gap: 10 (ref 5–15)
Anion gap: 9 (ref 5–15)
BUN: 32 mg/dL — ABNORMAL HIGH (ref 8–23)
BUN: 29 mg/dL — ABNORMAL HIGH (ref 8–23)
CO2: 21 mmol/L — ABNORMAL LOW (ref 22–32)
CO2: 21 mmol/L — ABNORMAL LOW (ref 22–32)
Calcium: 7.6 mg/dL — ABNORMAL LOW (ref 8.9–10.3)
Calcium: 7.7 mg/dL — ABNORMAL LOW (ref 8.9–10.3)
Chloride: 104 mmol/L (ref 98–111)
Chloride: 106 mmol/L (ref 98–111)
Creatinine, Ser: 1.36 mg/dL — ABNORMAL HIGH (ref 0.44–1.00)
Creatinine, Ser: 1.4 mg/dL — ABNORMAL HIGH (ref 0.44–1.00)
GFR, Estimated: 38 mL/min — ABNORMAL LOW (ref >60)
GFR, Estimated: 37 mL/min — ABNORMAL LOW (ref >60)
Glucose, Bld: 85 mg/dL (ref 70–99)
Glucose, Bld: 82 mg/dL (ref 70–99)
Potassium: 2.9 mmol/L — ABNORMAL LOW (ref 3.5–5.1)
Potassium: 3.9 mmol/L (ref 3.5–5.1)
Sodium: 135 mmol/L (ref 135–145)
Sodium: 136 mmol/L (ref 135–145)

## 2022-06-20 LAB — PROCALCITONIN: Procalcitonin: 4.17 ng/mL

## 2022-06-20 LAB — MAGNESIUM: Magnesium: 2.2 mg/dL (ref 1.7–2.4)

## 2022-06-20 MED ORDER — POTASSIUM CHLORIDE CRYS ER 20 MEQ PO TBCR
40.0000 meq | EXTENDED_RELEASE_TABLET | Freq: Once | ORAL | Status: AC
  Administered 2022-06-20: 40 meq via ORAL
  Filled 2022-06-20: qty 2

## 2022-06-20 NOTE — Plan of Care (Signed)
  Problem: Acute Rehab PT Goals(only PT should resolve) Goal: Pt Will Ambulate Flowsheets (Taken 06/20/2022 0932) Pt will Ambulate:  > 125 feet  with modified independence  with least restrictive assistive device Goal: Pt Will Go Up/Down Stairs Flowsheets (Taken 06/20/2022 0932) Pt will Go Up / Down Stairs:  3-5 stairs  with rail(s)  with supervision Goal: Pt/caregiver will Perform Home Exercise Program Flowsheets (Taken 06/20/2022 0932) Pt/caregiver will Perform Home Exercise Program:  For increased strengthening  Independently   9:33 AM, 06/20/22 Josue Hector PT DPT  Physical Therapist with [facility]  [phone]

## 2022-06-20 NOTE — Plan of Care (Signed)

## 2022-06-20 NOTE — Progress Notes (Signed)
Mobility Specialist Progress Note:    06/20/22 1232  Mobility  Activity Transferred from chair to bed;Ambulated with assistance to bathroom  Level of Assistance Standby assist, set-up cues, supervision of patient - no hands on  Assistive Device Front wheel walker  Distance Ambulated (ft) 30 ft  Activity Response Tolerated well  Mobility Referral Yes  $Mobility charge 1 Mobility   Pt requested assistance to transfer from C>B. While transferring pt requested to use bathroom, assisted in transfer. Returned pt to bed, alarm on, call bell in reach, all needs met.  Royetta Crochet Mobility Specialist Please contact via Solicitor or  Rehab office at [phone]

## 2022-06-20 NOTE — Progress Notes (Signed)
[facility] Progress Note Patient: [name] [id number] DOB: 10-Sep-1940 DOA: 06/18/2022  DOS: the patient was seen and examined on 06/20/2022  Brief hospital course: PMH of COPD, bronchiectasis, HTN, HLD, PAD, HFrEF, essential tremor, PAF present to the hospital with complaints of cough and shortness of breath. Found to have sepsis secondary to pneumonia. Assessment and Plan: Sepsis secondary to pneumonia. Had leukocytosis at the time of admission secondary to pneumonia with elevated procalcitonin level.  Was also tachypneic and tachycardic at the time of admission. This is secondary to pneumonia. Treated with IV azithromycin and Zosyn. Procalcitonin still elevated but improving. Blood cultures negative. Able to come off of oxygen, now room air.  Monitor.  Acute on chronic HFrEF. Treated with IV Lasix. Currently euvolemic. I do not think the patient actually had significant heart failure at the time of admission.  Acute respiratory failure with hypoxia secondary to pneumonia. Was requiring 5 L of oxygen at time of admission with saturation 88%. Currently improving. On room air.  Left lower extremity cellulitis. Does not appear to be acutely infected right now. Continue antibiotics.  Persistent A-fib. On metoprolol. Due to hypotension currently on hold. On Eliquis at home which I will continue.  History of COPD as well as bronchiectasis. Continue inhalers.  Left lower lobe cavitary nodule. Incidental finding. Patient does not want further workup. Outpatient CT recommended in 3 to 4 weeks if they decide.  HTN. Currently hypotensive. Blood pressure medications on hold.  PAD. Outpatient follow-up with vascular surgery recommended.  HLD. Continue statin.  Essential tremor. Patient is on Mysoline. Outpatient follow-up with neurology recommended.  History of breast cancer. History of bilateral mastectomy chemo and radiation. Currently in  remission. Outpatient follow-up with oncology.  Osteopenia as well as osteoporosis. Outpatient follow-up.  Subjective: Denies any acute complaint.  No nausea or vomiting.  Ambulated without oxygen.  Physical Exam: General: in Mild distress, No Rash Cardiovascular: S1 and S2 Present, No Murmur Respiratory: Good respiratory effort, Bilateral Air entry present.  Bilateral inspiratory crackles, No wheezes Abdomen: Bowel Sound present, No tenderness Extremities: No edema Neuro: Alert and oriented x3, no new focal deficit  Data Reviewed: I have Reviewed nursing notes, Vitals, and Lab results. Since last encounter, pertinent lab results CBC and BMP   . I have ordered test including CBC and BMP  .   Disposition: Status is: Inpatient Remains inpatient appropriate because: Need for close observation as patient is hypotensive  apixaban (ELIQUIS) tablet 2.5 mg Start: 06/18/22 2000 apixaban (ELIQUIS) tablet 2.5 mg   Family Communication: No one at bedside Level of care: Telemetry   Vitals:   06/20/22 0916 06/20/22 0918 06/20/22 1018 06/20/22 1302  BP:   97/66 132/76  Pulse:   79 89  Resp:      Temp:    98 F (36.7 C)  TempSrc:      SpO2: 91% 91%  93%  Weight:      Height:         Author: Berle Mull, MD 06/20/2022 5:48 PM  Please look on [email or web address] to find out who is on call.

## 2022-06-20 NOTE — Evaluation (Signed)
Physical Therapy Evaluation Patient Details Name: [name] MRN: [id number] DOB: 1940-06-06 Today's Date: 06/20/2022  History of Present Illness  82 year old female with a history of persistent atrial fibrillation, COPD/bronchiectasis, hypertension, hyperlipidemia, peripheral arterial disease, HFrEF, and essential tremor admitted on 06/18/22 with CHF exacerbation sepsis secondary to pneumonia with acute hypoxic respiratory failure   Clinical Impression  Patient presents supine in bed. Patient is awake, alert and very pleasant. Patient requires just set up assist for bed mobility and transferring to sitting at bedside. Seated balance is fair. Patient able to stand using RW for balance with supervision/ SBA. Patient able to walk in hallway using RW and CG. Patient demos no LOB but does show slight fatigue. Returned to room and monitored O2 which was at 91% following ambulation. Patient returned to siting upright in bed for breakfast. Patient with phone and call bell in reach and bed alarm set. Patient will benefit from continued physical therapy in hospital and recommended venue below to increase strength, balance, endurance for safe ADLs and gait.         Recommendations for follow up therapy are one component of a multi-disciplinary discharge planning process, led by the attending physician.  Recommendations may be updated based on patient status, additional functional criteria and insurance authorization.  Follow Up Recommendations No PT follow up      Assistance Recommended at Discharge Intermittent Supervision/Assistance  Patient can return home with the following  A little help with walking and/or transfers;Assist for transportation;A little help with bathing/dressing/bathroom    Equipment Recommendations None recommended by PT  Recommendations for Other Services       Functional Status Assessment Patient has had a recent decline in their functional status and demonstrates the ability  to make significant improvements in function in a reasonable and predictable amount of time.     Precautions / Restrictions Precautions Precautions: Fall Restrictions Weight Bearing Restrictions: No      Mobility  Bed Mobility Overal bed mobility: Modified Independent, Needs Assistance             General bed mobility comments: set up assist with blankets, IV lines    Transfers Overall transfer level: Needs assistance   Transfers: Sit to/from Stand Sit to Stand: Supervision           General transfer comment: SBA    Ambulation/Gait Ambulation/Gait assistance: Supervision Gait Distance (Feet): 150 Feet Assistive device: Rolling walker (2 wheels) Gait Pattern/deviations: Trunk flexed, Decreased stride length       General Gait Details: slow, decreased stride, slight fatigue  Stairs            Wheelchair Mobility    Modified Rankin (Stroke Patients Only)       Balance Overall balance assessment: Needs assistance Sitting-balance support: Bilateral upper extremity supported Sitting balance-Leahy Scale: Fair Sitting balance - Comments: Seated EOB   Standing balance support: Bilateral upper extremity supported Standing balance-Leahy Scale: Fair Standing balance comment: using RW at bedside                             Pertinent Vitals/Pain Pain Assessment Pain Assessment: No/denies pain    Home Living                          Prior Function  Hand Dominance        Extremity/Trunk Assessment   Upper Extremity Assessment Upper Extremity Assessment: Overall WFL for tasks assessed    Lower Extremity Assessment Lower Extremity Assessment: Generalized weakness    Cervical / Trunk Assessment Cervical / Trunk Assessment: Kyphotic  Communication      Cognition Arousal/Alertness: Awake/alert Behavior During Therapy: WFL for tasks assessed/performed Overall Cognitive Status: Within  Functional Limits for tasks assessed                                          General Comments      Exercises     Assessment/Plan    PT Assessment Patient needs continued PT services  PT Problem List Decreased strength;Decreased mobility;Decreased activity tolerance;Cardiopulmonary status limiting activity       PT Treatment Interventions DME instruction;Gait training;Therapeutic exercise;Patient/family education;Therapeutic activities;Stair training;Balance training;Functional mobility training    PT Goals (Current goals can be found in the Care Plan section)  Acute Rehab PT Goals Patient Stated Goal: Return home PT Goal Formulation: With patient Time For Goal Achievement: 07/04/22 Potential to Achieve Goals: Good    Frequency Min 3X/week     Co-evaluation               AM-PAC PT "6 Clicks" Mobility  Outcome Measure Help needed turning from your back to your side while in a flat bed without using bedrails?: None Help needed moving from lying on your back to sitting on the side of a flat bed without using bedrails?: A Little Help needed moving to and from a bed to a chair (including a wheelchair)?: A Little Help needed standing up from a chair using your arms (e.g., wheelchair or bedside chair)?: A Little Help needed to walk in hospital room?: A Little Help needed climbing 3-5 steps with a railing? : A Little 6 Click Score: 19    End of Session Equipment Utilized During Treatment: Gait belt Activity Tolerance: Patient tolerated treatment well;Patient limited by fatigue Patient left: in bed;with call bell/phone within reach;with bed alarm set Nurse Communication: Mobility status PT Visit Diagnosis: Other abnormalities of gait and mobility (R26.89);Muscle weakness (generalized) (M62.81);Difficulty in walking, not elsewhere classified (R26.2)    Time: [id number] PT Time Calculation (min) (ACUTE ONLY): 36 min   Charges:   PT Evaluation $PT Eval  Low Complexity: 1 Low PT Treatments $Therapeutic Activity: 8-22 mins    9:31 AM, 06/20/22 Josue Hector PT DPT  Physical Therapist with [facility]  [phone]

## 2022-06-20 NOTE — TOC Transition Note (Signed)
Transition of Care [facility]) - CM/SW Discharge Note   Patient Details  Name: [name] MRN: [id number] Date of Birth: Jun 28, 1940  Transition of Care [facility]) CM/SW Contact:  Boneta Lucks, RN Phone Number: 06/20/2022, 1:29 PM   Clinical Narrative:   Patient is active with [facility] for PT/RN. MD aware to order.    Final next level of care: Home w Home Health Services Barriers to Discharge: Barriers Resolved   Patient Goals and CMS Choice CMS Medicare.gov Compare Post Acute Care list provided to:: Patient Choice offered to / list presented to : Patient, Spouse  Discharge Placement                     Patient and family notified of of transfer: 06/20/22  Discharge Plan and Services Additional resources added to the After Visit Summary for   In-house Referral: Clinical Social Work Discharge Planning Services: CM Consult Post Acute Care Choice: Home Health               Social Determinants of Health (SDOH) Interventions SDOH Screenings   Food Insecurity: No Food Insecurity (06/18/2022)  Housing: [place]  (06/18/2022)  Transportation Needs: No Transportation Needs (06/18/2022)  Utilities: Not At Risk (06/18/2022)  Tobacco Use: Medium Risk (06/18/2022)     Readmission Risk Interventions    06/18/2022    1:55 PM 03/07/2022    8:09 AM  Readmission Risk Prevention Plan  Transportation Screening Complete Complete  HRI or Home Care Consult Complete Complete  Social Work Consult for [place] Planning/Counseling Complete Complete  Palliative Care Screening Not Applicable Not Applicable  Medication Review Press photographer) Complete Complete

## 2022-06-21 DIAGNOSIS — J9601 Acute respiratory failure with hypoxia: Secondary | ICD-10-CM | POA: Diagnosis not present

## 2022-06-21 LAB — COMPREHENSIVE METABOLIC PANEL
ALT: 22 U/L (ref 0–44)
AST: 23 U/L (ref 15–41)
Albumin: 2.5 g/dL — ABNORMAL LOW (ref 3.5–5.0)
Alkaline Phosphatase: 61 U/L (ref 38–126)
Anion gap: 8 (ref 5–15)
BUN: 24 mg/dL — ABNORMAL HIGH (ref 8–23)
CO2: 22 mmol/L (ref 22–32)
Calcium: 7.6 mg/dL — ABNORMAL LOW (ref 8.9–10.3)
Chloride: 105 mmol/L (ref 98–111)
Creatinine, Ser: 1.17 mg/dL — ABNORMAL HIGH (ref 0.44–1.00)
GFR, Estimated: 46 mL/min — ABNORMAL LOW (ref >60)
Glucose, Bld: 96 mg/dL (ref 70–99)
Potassium: 3.6 mmol/L (ref 3.5–5.1)
Sodium: 135 mmol/L (ref 135–145)
Total Bilirubin: 0.5 mg/dL (ref 0.3–1.2)
Total Protein: 6.4 g/dL — ABNORMAL LOW (ref 6.5–8.1)

## 2022-06-21 LAB — CBC WITH DIFFERENTIAL/PLATELET
Abs Immature Granulocytes: 0.03 10*3/uL (ref 0.00–0.07)
Basophils Absolute: 0 10*3/uL (ref 0.0–0.1)
Basophils Relative: 0 %
Eosinophils Absolute: 0.2 10*3/uL (ref 0.0–0.5)
Eosinophils Relative: 3 %
HCT: 26.9 % — ABNORMAL LOW (ref 36.0–46.0)
Hemoglobin: 8.6 g/dL — ABNORMAL LOW (ref 12.0–15.0)
Immature Granulocytes: 0 %
Lymphocytes Relative: 13 %
Lymphs Abs: 1 10*3/uL (ref 0.7–4.0)
MCH: 32.2 pg (ref 26.0–34.0)
MCHC: 32 g/dL (ref 30.0–36.0)
MCV: 100.7 fL — ABNORMAL HIGH (ref 80.0–100.0)
Monocytes Absolute: 0.7 10*3/uL (ref 0.1–1.0)
Monocytes Relative: 9 %
Neutro Abs: 5.8 10*3/uL (ref 1.7–7.7)
Neutrophils Relative %: 75 %
Platelets: 224 10*3/uL (ref 150–400)
RBC: 2.67 MIL/uL — ABNORMAL LOW (ref 3.87–5.11)
RDW: 17.4 % — ABNORMAL HIGH (ref 11.5–15.5)
WBC: 7.8 10*3/uL (ref 4.0–10.5)
nRBC: 0 % (ref 0.0–0.2)

## 2022-06-21 LAB — MAGNESIUM: Magnesium: 2.2 mg/dL (ref 1.7–2.4)

## 2022-06-21 MED ORDER — LEVALBUTEROL HCL 0.63 MG/3ML IN NEBU
0.6300 mg | INHALATION_SOLUTION | Freq: Four times a day (QID) | RESPIRATORY_TRACT | Status: DC | PRN
  Administered 2022-06-22: 0.63 mg via RESPIRATORY_TRACT
  Filled 2022-06-21: qty 3

## 2022-06-21 MED ORDER — METOPROLOL TARTRATE 50 MG PO TABS
75.0000 mg | ORAL_TABLET | Freq: Two times a day (BID) | ORAL | Status: DC
  Administered 2022-06-21 – 2022-06-24 (×7): 75 mg via ORAL
  Filled 2022-06-21 (×7): qty 1

## 2022-06-21 NOTE — Plan of Care (Signed)

## 2022-06-21 NOTE — Progress Notes (Signed)
[facility] PROGRESS NOTE  [name] (DOB: 1940-07-29) [id number] PCP: Celene Squibb, MD  Brief Narrative: PMH of COPD, bronchiectasis, HTN, HLD, PAD, HFrEF, essential tremor, PAF present to the hospital with complaints of cough and shortness of breath. Found to have sepsis secondary to pneumonia.  Subjective: Had palpitations associated with rapid AFib this AM. Dyspnea recurred and so far improved with metoprolol reinitiation.  Objective: BP 116/78 (BP Location: Left Arm)   Pulse 97   Temp 97.6 F (36.4 C) (Oral)   Resp 18   Ht '5\' 1"'$  (1.549 m)   Wt 45.5 kg   SpO2 93%   BMI 18.95 kg/m   Gen: Elderly frail female in no distress Pulm: Nonlabored, tachypneic at rest without crackles or wheezes  CV: Irreg tachycardia, no MRG GI: Soft, NT, ND, +BS  Neuro: Alert and oriented. No new focal deficits. Ext: Warm, L great toe amputation noted Skin: Left anterolateraly ankle/foot with wounds with mild slough and heaped edges, tender erythema mildly without exudate or fluctuance.    Assessment & Plan: Principal Problem:   Acute respiratory failure with hypoxia (HCC) Active Problems:   Hyperlipidemia   Persistent atrial fibrillation (HCC)   Sepsis due to undetermined organism ([place])   Aspiration pneumonitis (HCC)   Acute on chronic HFrEF (heart failure with reduced ejection fraction) ([place])  Sepsis secondary to pneumonia:  Had leukocytosis at the time of admission secondary to pneumonia with elevated procalcitonin level.  Was also tachypneic and tachycardic at the time of admission. This is secondary to pneumonia. Treated with IV azithromycin and Zosyn. Procalcitonin still elevated but improving. Blood cultures negative. Able to come off of oxygen, now room air.  Monitor.   Acute on chronic HFrEF. Treated with IV Lasix. Currently euvolemic. I do not think the patient actually had significant heart failure at the time of admission.   Acute respiratory failure with  hypoxia secondary to pneumonia: Improved.  - Ambulatory pulse oximetry.    Left lower extremity wound, cellulitis: Chronic.  - On abx as above - Continue local wound management, HH-RN to resume at DC.    Persistent A-fib: Had episode of RVR this AM.  - Restart metoprolol. Consider IV if needed to control rate.  - Continue eliquis.     History of COPD as well as bronchiectasis: No exacerbation currently. Continue inhalers.   Left lower lobe cavitary nodule. Incidental finding. Patient does not want further workup. Outpatient CT recommended in 3 to 4 weeks, shared w/patient.   HTN: BP meds held due to soft BP, now elevated - Restart home metoprolol as above   PAD s/p revascularization by Dr. Trula Slade.  Outpatient follow-up with vascular surgery recommended. Pulses palpable.   HLD. Continue statin.   Essential tremor. Patient is on [place]. Outpatient follow-up with neurology recommended.   History of breast cancer: History of bilateral mastectomy chemo and radiation. Currently in remission. - Outpatient follow-up with oncology.   Osteopenia as well as osteoporosis. - Outpatient follow-up.   Patrecia Pour, MD [facility] [email or web address] 06/21/2022, 3:52 PM

## 2022-06-22 ENCOUNTER — Inpatient Hospital Stay (HOSPITAL_COMMUNITY): Payer: Medicare HMO

## 2022-06-22 ENCOUNTER — Other Ambulatory Visit (HOSPITAL_COMMUNITY): Payer: Medicare HMO

## 2022-06-22 DIAGNOSIS — J9601 Acute respiratory failure with hypoxia: Secondary | ICD-10-CM | POA: Diagnosis not present

## 2022-06-22 DIAGNOSIS — I5041 Acute combined systolic (congestive) and diastolic (congestive) heart failure: Secondary | ICD-10-CM

## 2022-06-22 DIAGNOSIS — Z8673 Personal history of transient ischemic attack (TIA), and cerebral infarction without residual deficits: Secondary | ICD-10-CM

## 2022-06-22 DIAGNOSIS — I484 Atypical atrial flutter: Secondary | ICD-10-CM

## 2022-06-22 DIAGNOSIS — I429 Cardiomyopathy, unspecified: Secondary | ICD-10-CM

## 2022-06-22 LAB — BASIC METABOLIC PANEL
Anion gap: 12 (ref 5–15)
BUN: 24 mg/dL — ABNORMAL HIGH (ref 8–23)
CO2: 22 mmol/L (ref 22–32)
Calcium: 8.5 mg/dL — ABNORMAL LOW (ref 8.9–10.3)
Chloride: 104 mmol/L (ref 98–111)
Creatinine, Ser: 1.2 mg/dL — ABNORMAL HIGH (ref 0.44–1.00)
GFR, Estimated: 45 mL/min — ABNORMAL LOW (ref >60)
Glucose, Bld: 118 mg/dL — ABNORMAL HIGH (ref 70–99)
Potassium: 4.3 mmol/L (ref 3.5–5.1)
Sodium: 138 mmol/L (ref 135–145)

## 2022-06-22 LAB — CBC
HCT: 31.9 % — ABNORMAL LOW (ref 36.0–46.0)
Hemoglobin: 10.1 g/dL — ABNORMAL LOW (ref 12.0–15.0)
MCH: 31.5 pg (ref 26.0–34.0)
MCHC: 31.7 g/dL (ref 30.0–36.0)
MCV: 99.4 fL (ref 80.0–100.0)
Platelets: 318 10*3/uL (ref 150–400)
RBC: 3.21 MIL/uL — ABNORMAL LOW (ref 3.87–5.11)
RDW: 17.4 % — ABNORMAL HIGH (ref 11.5–15.5)
WBC: 10.4 10*3/uL (ref 4.0–10.5)
nRBC: 0.2 % (ref 0.0–0.2)

## 2022-06-22 LAB — PROCALCITONIN: Procalcitonin: 1.24 ng/mL

## 2022-06-22 LAB — BRAIN NATRIURETIC PEPTIDE: B Natriuretic Peptide: 2405 pg/mL — ABNORMAL HIGH (ref 0.0–100.0)

## 2022-06-22 LAB — GLUCOSE, CAPILLARY: Glucose-Capillary: 116 mg/dL — ABNORMAL HIGH (ref 70–99)

## 2022-06-22 LAB — TROPONIN I (HIGH SENSITIVITY): Troponin I (High Sensitivity): 14 ng/L (ref <18)

## 2022-06-22 MED ORDER — FUROSEMIDE 10 MG/ML IJ SOLN
40.0000 mg | Freq: Two times a day (BID) | INTRAMUSCULAR | Status: DC
  Administered 2022-06-22: 40 mg via INTRAVENOUS
  Filled 2022-06-22 (×2): qty 4

## 2022-06-22 MED ORDER — UMECLIDINIUM BROMIDE 62.5 MCG/ACT IN AEPB
1.0000 | INHALATION_SPRAY | Freq: Every day | RESPIRATORY_TRACT | Status: DC
  Administered 2022-06-22 – 2022-06-24 (×3): 1 via RESPIRATORY_TRACT
  Filled 2022-06-22: qty 7

## 2022-06-22 MED ORDER — FUROSEMIDE 10 MG/ML IJ SOLN
40.0000 mg | Freq: Once | INTRAMUSCULAR | Status: AC
  Administered 2022-06-22: 40 mg via INTRAVENOUS
  Filled 2022-06-22: qty 4

## 2022-06-22 NOTE — Plan of Care (Signed)
  Problem: Education: Goal: Knowledge of General Education information will improve Description Including pain rating scale, medication(s)/side effects and non-pharmacologic comfort measures Outcome: Progressing   Problem: Health Behavior/Discharge Planning: Goal: Ability to manage health-related needs will improve Outcome: Progressing   

## 2022-06-22 NOTE — Progress Notes (Signed)
Was called to patient's room.  RN explained that patient was stating that she did not feel well and was having some SOB.  RN had placed patient on 2L [place] when she complained earlier of SOB, despite sats being WNL.  Listened to BS, patient has expiratory wheezing, gave Xopenex treatment (patient has hx of Afib).  Will continue to monitor.

## 2022-06-22 NOTE — Progress Notes (Signed)
   06/22/22 1250  Assess: MEWS Score  Temp (!) 97.4 F (36.3 C)  BP (!) 137/92  MAP (mmHg) 115  Pulse Rate (!) 103  Resp (!) 24  Level of Consciousness Alert  SpO2 95 %  O2 Device Nasal Cannula  O2 Flow Rate (L/min) 2 L/min  Assess: MEWS Score  MEWS Temp 0  MEWS Systolic 0  MEWS Pulse 1  MEWS RR 1  MEWS LOC 0  MEWS Score 2  MEWS Score Color Yellow  Assess: if the MEWS score is Yellow or Red  Were vital signs taken at a resting state? Yes  Focused Assessment No change from prior assessment  Does the patient meet 2 or more of the SIRS criteria? No  MEWS guidelines implemented  No, previously yellow, continue vital signs every 4 hours  Assess: SIRS CRITERIA  SIRS Temperature  0  SIRS Pulse 1  SIRS Respirations  1  SIRS WBC 0  SIRS Score Sum  2

## 2022-06-22 NOTE — Progress Notes (Addendum)
Around 9:00pm: Patient moving around, trying to get comfortable in bed and became very dyspenic, educated pt. On pursed lip breathing. Pt. recovered rapidly. Called respiratory, they said she didn't have any orders and recommended applying O2 for comfort, if dyspnea occurs again.   Around 11:00p-12:00am: Pt. Went to the bathroom and came back again very dyspneic, Assessed vitals, all within normal limits, applied oxygen for comfort. Pt. Recovered within approx. 5 minutes.   Around 1:00a-2:00am: Pt. Went to bathroom again, and on the way back to bed became dyspneic, vitals normal, O2 97 on 2 L.   At 3:15am: Came to assess patient for rounding and saw pt. Sitting on the side of the bed, patient states, "I just don't feel good, I don't feel good at all." Pt. Appears very SOB with no exertion. Vitals 136/87 HR 95 RR 25 O2 91 @2L , on a scale of 0-10, no pain; lungs sound wheezy and seems to be struggling getting a full breath. RRT called. Came to bedside to assess, treatment is being given. MD made aware also. Orders in chart.   At 4:00am: pt. Lying in bed with eyes closed resting. No observation of SOB.  At 5:30am: Pt. Lying in bed awake. Dyspnea has decreased. Pt. States she feels a little better. Changed wound dressing per order.

## 2022-06-22 NOTE — Care Management Important Message (Signed)
Important Message  Patient Details  Name: [name] MRN: [id number] Date of Birth: [birth year]   Medicare Important Message Given:  Yes     Tommy Medal 06/22/2022, 2:00 PM

## 2022-06-22 NOTE — Progress Notes (Signed)
   06/22/22 0801  Assess: MEWS Score  Temp (!) 97.5 F (36.4 C)  BP (!) 145/89  MAP (mmHg) 117  Pulse Rate (!) 105  Resp (!) 22  SpO2 98 %  O2 Device Nasal Cannula  O2 Flow Rate (L/min) 2 L/min  Assess: MEWS Score  MEWS Temp 0  MEWS Systolic 0  MEWS Pulse 1  MEWS RR 1  MEWS LOC 0  MEWS Score 2  MEWS Score Color Yellow  Assess: if the MEWS score is Yellow or Red  Were vital signs taken at a resting state? Yes  Focused Assessment No change from prior assessment  Does the patient meet 2 or more of the SIRS criteria? No  MEWS guidelines implemented  Yes, yellow  Treat  MEWS Interventions Considered administering scheduled or prn medications/treatments as ordered  Take Vital Signs  Increase Vital Sign Frequency  Yellow: Q2hr x1, continue Q4hrs until patient remains green for 12hrs  Escalate  MEWS: Escalate Yellow: Discuss with charge nurse and consider notifying provider and/or RRT  Notify: Charge Nurse/RN  Name of Charge Nurse/RN Notified Brandie RN  Assess: SIRS CRITERIA  SIRS Temperature  0  SIRS Pulse 1  SIRS Respirations  1  SIRS WBC 0  SIRS Score Sum  2

## 2022-06-22 NOTE — Progress Notes (Signed)
Physical Therapy Treatment Patient Details Name: [name] MRN: [id number] DOB: November 28, 1940 Today's Date: 06/22/2022   History of Present Illness 82 year old female with a history of persistent atrial fibrillation, COPD/bronchiectasis, hypertension, hyperlipidemia, peripheral arterial disease, HFrEF, and essential tremor admitted on 06/18/22 with CHF exacerbation sepsis secondary to pneumonia with acute hypoxic respiratory failure    PT Comments    Patient with fatigue and increased SOB today but is agreeable to participate. She is able to transfer to standing without loss of balance with unilateral HHA and ambulate a few steps to chair. She performs seated exercises with increased fatigue and some SOB. Patient ends session seated in chair. Patient will benefit from continued skilled physical therapy in hospital and recommended venue below to increase strength, balance, endurance for safe ADLs and gait.    Recommendations for follow up therapy are one component of a multi-disciplinary discharge planning process, led by the attending physician.  Recommendations may be updated based on patient status, additional functional criteria and insurance authorization.  Follow Up Recommendations  No PT follow up     Assistance Recommended at Discharge Intermittent Supervision/Assistance  Patient can return home with the following A little help with walking and/or transfers;Assist for transportation;A little help with bathing/dressing/bathroom   Equipment Recommendations  None recommended by PT    Recommendations for Other Services       Precautions / Restrictions Precautions Precautions: Fall Restrictions Weight Bearing Restrictions: No     Mobility  Bed Mobility Overal bed mobility: Modified Independent             General bed mobility comments: set up assist with blankets, IV lines    Transfers Overall transfer level: Needs assistance Equipment used: 1 person hand held  assist Transfers: Sit to/from Stand Sit to Stand: Supervision           General transfer comment: SBA    Ambulation/Gait Ambulation/Gait assistance: Supervision Gait Distance (Feet): 5 Feet Assistive device: 1 person hand held assist Gait Pattern/deviations: Trunk flexed, Decreased stride length Gait velocity: decreased     General Gait Details: slow, decreased stride, slight fatigue   Stairs             Wheelchair Mobility    Modified Rankin (Stroke Patients Only)       Balance Overall balance assessment: Needs assistance Sitting-balance support: Bilateral upper extremity supported Sitting balance-Leahy Scale: Fair Sitting balance - Comments: Seated EOB   Standing balance support: Bilateral upper extremity supported Standing balance-Leahy Scale: Fair                              Cognition Arousal/Alertness: Awake/alert Behavior During Therapy: WFL for tasks assessed/performed Overall Cognitive Status: Within Functional Limits for tasks assessed                                          Exercises General Exercises - Lower Extremity Long Arc Quad: AROM, Both, 15 reps, Seated Hip Flexion/Marching: AROM, Both, 15 reps, Seated Toe Raises: AROM, Both, 15 reps, Seated Heel Raises: AROM, Both, 15 reps, Seated    General Comments        Pertinent Vitals/Pain Pain Assessment Pain Assessment: No/denies pain    Home Living  Prior Function            PT Goals (current goals can now be found in the care plan section) Acute Rehab PT Goals Patient Stated Goal: Return home PT Goal Formulation: With patient Time For Goal Achievement: 07/04/22 Potential to Achieve Goals: Good Progress towards PT goals: Progressing toward goals    Frequency    Min 3X/week      PT Plan Current plan remains appropriate    Co-evaluation              AM-PAC PT "6 Clicks" Mobility   Outcome  Measure  Help needed turning from your back to your side while in a flat bed without using bedrails?: None Help needed moving from lying on your back to sitting on the side of a flat bed without using bedrails?: A Little Help needed moving to and from a bed to a chair (including a wheelchair)?: A Little Help needed standing up from a chair using your arms (e.g., wheelchair or bedside chair)?: A Little Help needed to walk in hospital room?: A Little Help needed climbing 3-5 steps with a railing? : A Little 6 Click Score: 19    End of Session Equipment Utilized During Treatment: Oxygen Activity Tolerance: Patient tolerated treatment well;Patient limited by fatigue Patient left: in chair;with call bell/phone within reach Nurse Communication: Mobility status PT Visit Diagnosis: Other abnormalities of gait and mobility (R26.89);Muscle weakness (generalized) (M62.81);Difficulty in walking, not elsewhere classified (R26.2)     Time: [id number] PT Time Calculation (min) (ACUTE ONLY): 12 min  Charges:  $Therapeutic Exercise: 8-22 mins                     10:21 AM, 06/22/22 Mearl Latin PT, DPT Physical Therapist at [facility]

## 2022-06-22 NOTE — Consult Note (Signed)
CARDIOLOGY CONSULT NOTE    Patient ID: [name]; [id number]; December 14, 1940   Admit date: 06/18/2022 Date of Consult: 06/22/2022  Primary Care Provider: Celene Squibb, MD Primary Cardiologist:  Primary Electrophysiologist:     Patient Profile:   [name] is a 82 y.o. female with a hx of persistent A-fib/atrial flutter, new onset cardiomyopathy LVEF 40 to 45% on 05/24/2022, PAD, history of CVA who is being seen today for the evaluation of shortness of breath/volume overload at the request of Dr. Bonner Puna.  History of Present Illness:   [name] is a 82 year old F known to have persistent A-fib/atrial flutter with new onset cardiomyopathy LVEF 40 to 45% on 05/24/2022, PAD, history of CVA presented to the ER with SOB.  She is currently admitted to hospitalist team for the management of acute hypoxic respiratory failure secondary to sepsis from pneumonia and acute systolic heart failure exacerbation.  EKG showed atypical atrial flutter.  Telemetry reviewed, atypical atrial flutter with rates between 80 and 100.  Past Medical History:  Diagnosis Date   Anemia 05/2014.   Atrial fibrillation ([place])    x 3 yrs   Breast cancer ([place])    S/P left mastectomy and chemotherapy 1989 remained in remission   Carotid artery occlusion    Carotid bruit    LICA [id number] (duplex [id number])   Degenerative arthritis of spine 2015   Chronic back pain   Hyperlipidemia    Hypertension    Meningioma ([place])    Osteoporosis    Pneumonia May 19, 2014   Stroke [facility])    CVA 2008   Subclavian steal syndrome    Ulcers of both lower extremities ([place]) 2015    Past Surgical History:  Procedure Laterality Date   ABDOMINAL AORTOGRAM W/LOWER EXTREMITY N/A 12/19/2021   Procedure: ABDOMINAL AORTOGRAM W/LOWER EXTREMITY;  Surgeon: Serafina Mitchell, MD;  Location: [place] [facility];  Service: Cardiovascular;  Laterality: N/A;   AMPUTATION TOE Left 12/29/2021   Procedure: Incisional DEBRIDEMENT Left Foot with Amputation  Left Great Toe;  Surgeon: Felipa Furnace, DPM;  Location: [place];  Service: Podiatry;  Laterality: Left;   CARDIAC CATHETERIZATION     CATARACT EXTRACTION Bilateral 2013   COLONOSCOPY  11/17/2010   Procedure: COLONOSCOPY;  Surgeon: [name], MD;  [facility];  Service: Endoscopy;  Laterality: N/A;  10:45 am   ESOPHAGOGASTRODUODENOSCOPY  11/17/2010   Procedure: ESOPHAGOGASTRODUODENOSCOPY (EGD);  Surgeon: [name], MD;  [facility];  Service: Endoscopy;  Laterality: N/A;   ESOPHAGOGASTRODUODENOSCOPY N/A 02/16/2015   Procedure: ESOPHAGOGASTRODUODENOSCOPY (EGD);  Surgeon: [name], MD;  Location: [place];  Service: Gastroenterology;  Laterality: N/A;   EXPLORATORY LAPAROTOMY  1960s   For peritonitis of undetermined cause   EYE SURGERY     LUMBAR EPIDURAL INJECTION  06-2012--06-2013   pt. states she has had 5 epidurals in 06-2012----06-2013   MASTECTOMY Left 1990   PERIPHERAL VASCULAR INTERVENTION Left 12/19/2021   Procedure: PERIPHERAL VASCULAR INTERVENTION;  Surgeon: Serafina Mitchell, MD;  Location: [place] [facility];  Service: Cardiovascular;  Laterality: Left;  SFA and PTA of POP   YAG LASER APPLICATION Right [id number]   Procedure: YAG LASER APPLICATION;  Surgeon: [name], MD;  Location: [facility];  Service: Ophthalmology;  Laterality: Right;  right   YAG LASER APPLICATION Left [id number]   Procedure: YAG LASER APPLICATION;  Surgeon: [name], MD;  Location: [facility];  Service: Ophthalmology;  Laterality: Left;  Inpatient Medications: Scheduled Meds:  apixaban  2.5 mg Oral BID   ascorbic acid  500 mg Oral Daily   clopidogrel  75 mg Oral q morning   collagenase   Topical Daily   metoprolol tartrate  75 mg Oral BID   multivitamin with minerals  1 tablet Oral q morning   pantoprazole  40 mg Oral Daily   pravastatin  40 mg Oral Daily   sodium chloride flush  3 mL Intravenous Q12H   umeclidinium bromide  1 puff Inhalation Daily    Continuous Infusions:  sodium chloride 250 mL (06/19/22 1029)   azithromycin 500 mg (06/22/22 1050)   piperacillin-tazobactam (ZOSYN)  IV 3.375 g (06/22/22 1435)   PRN Meds: sodium chloride, acetaminophen, levalbuterol, ondansetron (ZOFRAN) IV, sodium chloride flush  Allergies:    Allergies  Allergen Reactions   Iodine Rash and Other (See Comments)    BETADINE Rash/burning, blisters on skin. Burns skin   Iohexol Rash and Other (See Comments)    Blisters; PT NEEDS 13-HOUR PREP    Sulfa Antibiotics Nausea And Vomiting   Petroleum Gauze Non-Woven 3x9" [Wound Dressings] Other (See Comments)    BURNING SENSATION, RED SKIN   Povidone-Iodine Other (See Comments)    Burns skin   Wound Dressing Adhesive Other (See Comments)    Burns skin   Tape Itching, Rash and Other (See Comments)    "DO NOT USE ADHESIVE TAPE" Not even a Band Aid" NO PAPER TAPE Burns skin Skin is very very thin    Social History:   Social History   Socioeconomic History   Marital status: Married    Spouse name: Not on file   Number of children: 2   Years of education: Not on file   Highest education level: Not on file  Occupational History   Occupation: Retired    Fish farm manager: RETIRED  Tobacco Use   Smoking status: Former    Packs/day: 1.00    Years: 56.00    Additional pack years: 0.00    Total pack years: 56.00    Types: Cigarettes    Quit date: 08/14/2006    Years since quitting: 15.8    Passive exposure: Never   Smokeless tobacco: Never   Tobacco comments:    quit in 2008  Vaping Use   Vaping Use: Never used  Substance and Sexual Activity   Alcohol use: No    Alcohol/week: 0.0 standard drinks of alcohol   Drug use: No   Sexual activity: Never  Other Topics Concern   Not on file  Social History Narrative   Not on file   Social Determinants of Health   Financial Resource Strain: Not on file  Food Insecurity: No Food Insecurity (06/18/2022)   Hunger Vital Sign    Worried About Running  Out of Food in the Last Year: Never true    Ran Out of Food in the Last Year: Never true  Transportation Needs: No Transportation Needs (06/18/2022)   PRAPARE - Hydrologist (Medical): No    Lack of Transportation (Non-Medical): No  Physical Activity: Not on file  Stress: Not on file  Social Connections: Not on file  Intimate Partner Violence: Not At Risk (06/18/2022)   Humiliation, Afraid, Rape, and Kick questionnaire    Fear of Current or Ex-Partner: No    Emotionally Abused: No    Physically Abused: No    Sexually Abused: No    Family History:    Family History  Problem Relation Age of Onset   Alzheimer's disease Mother    Dementia Mother    Hypertension Mother    Hyperlipidemia Mother    Parkinsonism Father      ROS:  Please see the history of present illness.  ROS  All other ROS reviewed and negative.     Physical Exam/Data:   Vitals:   06/22/22 0801 06/22/22 1002 06/22/22 1250 06/22/22 1600  BP: (!) 145/89  (!) 137/92 (!) 140/95  Pulse: (!) 105  (!) 103 (!) 107  Resp: (!) 22  (!) 24 20  Temp: (!) 97.5 F (36.4 C)  (!) 97.4 F (36.3 C) 97.9 F (36.6 C)  TempSrc: Oral  Oral   SpO2: 98% 98% 95% 96%  Weight:      Height:        Intake/Output Summary (Last 24 hours) at 06/22/2022 1638 Last data filed at 06/22/2022 1500 Gross per 24 hour  Intake 960 ml  Output 1950 ml  Net -990 ml   Filed Weights   06/20/22 0520 06/21/22 0500 06/22/22 0534  Weight: 45 kg 45.5 kg 46.3 kg   Body mass index is 19.27 kg/m.  General:  Well nourished, well developed, in no acute distress HEENT: normal Lymph: no adenopathy Neck: JVD elevated till the angle of mandible Endocrine:  No thryomegaly Vascular: No carotid bruits; FA pulses 2+ bilaterally without bruits  Cardiac:  normal S1, S2; RRR; no murmur  Lungs:  clear to auscultation bilaterally, no wheezing, rhonchi or rales  Abd: soft, nontender, no hepatomegaly  Ext: no edema Musculoskeletal:   No deformities, BUE and BLE strength normal and equal Skin: warm and dry  Neuro:  CNs 2-12 intact, no focal abnormalities noted Psych:  Normal affect   EKG:  The EKG was personally reviewed and demonstrates: Atypical atrial flutter Telemetry:  Telemetry was personally reviewed and demonstrates: Atypical atrial daughter, rates between 80-100s  Relevant CV Studies:   Laboratory Data:  Chemistry Recent Labs  Lab 06/20/22 1517 06/21/22 0436 06/22/22 0742  NA 136 135 138  K 3.9 3.6 4.3  CL 106 105 104  CO2 21* 22 22  GLUCOSE 82 96 118*  BUN 29* 24* 24*  CREATININE 1.40* 1.17* 1.20*  CALCIUM 7.7* 7.6* 8.5*  GFRNONAA 37* 46* 45*  ANIONGAP 9 8 12     Recent Labs  Lab 06/18/22 0752 06/21/22 0436  PROT 7.8 6.4*  ALBUMIN 3.2* 2.5*  AST 60* 23  ALT 32 22  ALKPHOS 78 61  BILITOT 0.9 0.5   Hematology Recent Labs  Lab 06/20/22 1517 06/21/22 0436 06/22/22 0742  WBC 8.6 7.8 10.4  RBC 2.77* 2.67* 3.21*  HGB 8.7* 8.6* 10.1*  HCT 27.7* 26.9* 31.9*  MCV 100.0 100.7* 99.4  MCH 31.4 32.2 31.5  MCHC 31.4 32.0 31.7  RDW 17.4* 17.4* 17.4*  PLT 209 224 318   Cardiac EnzymesNo results for input(s): "TROPONINI" in the last 168 hours. No results for input(s): "TROPIPOC" in the last 168 hours.  BNP Recent Labs  Lab 06/18/22 0752 06/22/22 0742  BNP 866.0* 2,405.0*    DDimer No results for input(s): "DDIMER" in the last 168 hours.  Radiology/Studies:  DG CHEST PORT 1 VIEW  Result Date: 06/22/2022 CLINICAL DATA:  Dyspnea EXAM: PORTABLE CHEST 1 VIEW COMPARISON:  Radiograph and CT from 4 days ago FINDINGS: Diffuse interstitial opacity with Kerley lines and small bilateral pleural effusion. Bilateral fissure thickening. Cardiomegaly and aortic tortuosity. Pulmonary opacification and pleural fluid has progressed. No pneumothorax. Postoperative  left breast and axilla. IMPRESSION: Worsening pulmonary edema and pleural effusion. Electronically Signed   By: Jorje Guild M.D.   On:  06/22/2022 04:09    Assessment and Plan:   Patient is 82 year old F known to have persistent A-fib/atrial flutter with new onset cardiomyopathy LVEF 40 to 45% on 05/24/2022, PAD, history of CVA is currently admitted to hospitalist team for the management of acute hypoxic respiratory failure secondary to pneumonia and acute systolic heart failure exacerbation.  # Acute hypoxic respiratory failure secondary to acute systolic heart failure exacerbation and pneumonia -Patient has symptoms of SOB but better since admission with IV antibiotics and JVD elevated till the angle of mandible. Received IV Lasix 40 mg 1 dose this a.m., recommend to start IV Lasix 40 mg twice daily. -IV antibiotics per primary team.  # New onset cardiomyopathy LVEF 40 to 45% likely secondary to atypical atrial flutter with RVR but CAD cannot be ruled out -EKGs from 12/2021 showed atypical atrial flutter with elevated heart rates, 80-100s. Likely might have led to cardiomyopathy with LVEF 40 to 45%.  Echocardiogram from 05/2022 showed moderate LA dilation and normal right atrial size. She was seen in the A-fib clinic recently and was deemed not a candidate for antiarrhythmic therapy initiation. Does not prefer Amio due to tremors.  She might benefit from DCCV due to atrial flutter with cardiomyopathy, will defer to primary cardiologist. -Continue Eliquis 2.5 mg twice daily  # History of CVA # History of subclavian steal syndrome -Not on aspirin due to Eliquis use, on Plavix home dose.  Continue Plavix. -Continue pravastatin 40 mg nightly  I have spent a total of 65 minutes with patient reviewing chart , telemetry, EKGs, labs and examining patient as well as establishing an assessment and plan that was discussed with the patient.  > 50% of time was spent in direct patient care.      For questions or updates, please contact [place] Please consult [email or web address] for contact info under Cardiology/STEMI.   Signed, Vangie Bicker, MD 06/22/2022 4:38 PM

## 2022-06-22 NOTE — Progress Notes (Signed)
[facility] PROGRESS NOTE  [name] (DOB: Oct 25, 1940) [id number] PCP: Celene Squibb, MD  Brief Narrative: PMH of COPD, bronchiectasis, HTN, HLD, PAD, HFrEF, essential tremor, atrial flutter present to the hospital with complaints of cough and shortness of breath. Found to have sepsis secondary to pneumonia and acute pulmonary edema.  Subjective: Abrupt worsening dyspnea with some hypoxia overnight, exertional. Later after lasix this has improved, she's feeling better. No chest pain.   Objective: BP (!) 140/95   Pulse (!) 107   Temp 97.9 F (36.6 C)   Resp 20   Ht 5\' 1"  (1.549 m)   Wt 46.3 kg   SpO2 96%   BMI 19.27 kg/m   Gen: No distress, frail, thin  Pulm: Crackles at bases, no wheezes, nonlabored with supplemental oxygen  CV: Irreg, rate in 90's, ++JVD, no change to edema GI: Soft, NT, ND, +BS  Neuro: Alert and oriented. No new focal deficits. Ext: Warm, no deformities. Skin: No new rashes, lesions or ulcers on visualized skin   Assessment & Plan: Principal Problem:   Acute respiratory failure with hypoxia (HCC) Active Problems:   Hyperlipidemia   Persistent atrial fibrillation (HCC)   Sepsis due to undetermined organism ([place])   Aspiration pneumonitis (HCC)   Acute on chronic HFrEF (heart failure with reduced ejection fraction) ([place])  Sepsis secondary to pneumonia:  Had leukocytosis at the time of admission secondary to pneumonia with elevated procalcitonin level.  Was also tachypneic and tachycardic at the time of admission. This is secondary to pneumonia. Treated with IV azithromycin and Zosyn. Will conclude azithromycin at 5 days, continue zosyn x7. Procalcitonin continues improving, hypoxic respiratory failure now primarily driven by acute CHF.     Acute on chronic HFrEF: Treated with IV Lasix which was stopped. Pt's echo demonstrates LVEF 40-45% may be related to atrial flutter chronically and acutely with elevated rates.  - Control atrial rate,  consult cardiology for recommendations.  - CXR showed worsening edema associated with dyspnea, improving with IV lasix, will give 2nd dose this PM if pt will allow and monitor I/O, weights, BMP in AM   Acute respiratory failure with hypoxia secondary to pneumonia: Improved but recurrent.  - Wean oxygen today as tolerated.   Left lower extremity wound, cellulitis: Chronic.  - On abx as above - Continue local wound management, HH-RN to resume at DC.    Persistent A-fib: Had episode of RVR this AM.  - Restart metoprolol. Consider IV if needed to control rate.  - Continue eliquis.     History of COPD as well as bronchiectasis: No exacerbation currently. Continue inhalers.   Left lower lobe cavitary nodule. Incidental finding. Patient does not want further workup. Outpatient CT recommended in 3 to 4 weeks, shared w/patient.   HTN: BP meds held due to soft BP, now elevated - Restart home metoprolol as above   PAD s/p revascularization by Dr. Trula Slade.  Outpatient follow-up with vascular surgery recommended. Pulses palpable.   HLD. Continue statin.   Essential tremor. Patient is on [place]. Outpatient follow-up with neurology recommended.   History of breast cancer: History of bilateral mastectomy chemo and radiation. Currently in remission. - Outpatient follow-up with oncology.   Osteopenia as well as osteoporosis. - Outpatient follow-up.   Patrecia Pour, MD [facility] [email or web address] 06/22/2022, 5:15 PM

## 2022-06-23 DIAGNOSIS — J9601 Acute respiratory failure with hypoxia: Secondary | ICD-10-CM | POA: Diagnosis not present

## 2022-06-23 LAB — BASIC METABOLIC PANEL
Anion gap: 11 (ref 5–15)
BUN: 24 mg/dL — ABNORMAL HIGH (ref 8–23)
CO2: 23 mmol/L (ref 22–32)
Calcium: 7.7 mg/dL — ABNORMAL LOW (ref 8.9–10.3)
Chloride: 102 mmol/L (ref 98–111)
Creatinine, Ser: 1.43 mg/dL — ABNORMAL HIGH (ref 0.44–1.00)
GFR, Estimated: 36 mL/min — ABNORMAL LOW (ref >60)
Glucose, Bld: 78 mg/dL (ref 70–99)
Potassium: 3.3 mmol/L — ABNORMAL LOW (ref 3.5–5.1)
Sodium: 136 mmol/L (ref 135–145)

## 2022-06-23 LAB — CULTURE, BLOOD (ROUTINE X 2)
Culture: NO GROWTH
Culture: NO GROWTH
Special Requests: ADEQUATE

## 2022-06-23 MED ORDER — POTASSIUM CHLORIDE CRYS ER 20 MEQ PO TBCR
40.0000 meq | EXTENDED_RELEASE_TABLET | Freq: Once | ORAL | Status: AC
  Administered 2022-06-23: 40 meq via ORAL
  Filled 2022-06-23: qty 2

## 2022-06-23 MED ORDER — FUROSEMIDE 10 MG/ML IJ SOLN
40.0000 mg | Freq: Every day | INTRAMUSCULAR | Status: DC
  Administered 2022-06-23 – 2022-06-24 (×2): 40 mg via INTRAVENOUS
  Filled 2022-06-23: qty 4

## 2022-06-23 NOTE — Plan of Care (Signed)
  Problem: Education: Goal: Knowledge of General Education information will improve Description: Including pain rating scale, medication(s)/side effects and non-pharmacologic comfort measures 06/23/2022 0803 by Santa Lighter, RN Outcome: Progressing 06/22/2022 1858 by Santa Lighter, RN Outcome: Progressing   Problem: Health Behavior/Discharge Planning: Goal: Ability to manage health-related needs will improve 06/23/2022 0803 by Santa Lighter, RN Outcome: Progressing 06/22/2022 1858 by Santa Lighter, RN Outcome: Progressing

## 2022-06-23 NOTE — Progress Notes (Signed)
SATURATION QUALIFICATIONS: (This note is used to comply with regulatory documentation for home oxygen)  Patient Saturations on Room Air at Rest = 97%  Patient Saturations on Room Air while Ambulating = 95%  Patient Saturations on 0 Liters of oxygen while Ambulating = 0%  Please briefly explain why patient needs home oxygen: 

## 2022-06-23 NOTE — Progress Notes (Signed)
[facility] PROGRESS NOTE  [name] (DOB: 1940-09-14) [id number] PCP: Celene Squibb, MD  Brief Narrative: PMH of COPD, bronchiectasis, HTN, HLD, PAD, HFrEF, essential tremor, atrial flutter present to the hospital with complaints of cough and shortness of breath. Found to have sepsis secondary to pneumonia and acute pulmonary edema.  Subjective: Feeling better and better, no episodes of dyspnea in past 24 hours, but has been on oxygen constantly. No chest pain.   Objective: BP (!) 145/58 (BP Location: Left Arm)   Pulse 69   Temp 98.2 F (36.8 C) (Oral)   Resp 18   Ht 5\' 1"  (1.549 m)   Wt 44.2 kg   SpO2 98%   BMI 18.41 kg/m   Gen: Frail pleasant female in no distress Pulm: Crackles scant at bases, no wheezes, nonlabored with supplemental oxygen at rest  CV: No MRG, improved JVD, no edema GI: Soft, NT, ND, +BS  Neuro: Alert and oriented. No new focal deficits. Ext: Warm, no deformities. Skin: No rashes, lesions or ulcers on visualized skin   Assessment & Plan: Principal Problem:   Acute respiratory failure with hypoxia (HCC) Active Problems:   Hyperlipidemia   Persistent atrial fibrillation (HCC)   Sepsis due to undetermined organism ([place])   Aspiration pneumonitis (HCC)   Acute on chronic HFrEF (heart failure with reduced ejection fraction) ([place])  Sepsis secondary to pneumonia:  Had leukocytosis at the time of admission secondary to pneumonia with elevated procalcitonin level.  Was also tachypneic and tachycardic at the time of admission. This is secondary to pneumonia. Treated with IV azithromycin and Zosyn.  - Completed azithromycin at 5 days - Continue zosyn x7 days Procalcitonin continues improving, hypoxic respiratory failure now primarily driven by acute CHF.     Acute on chronic HFrEF: Treated with IV Lasix which was stopped. Pt's echo demonstrates LVEF 40-45% may be related to atrial flutter chronically and acutely with elevated rates.  - Control  atrial rate, consulted cardiology for recommendations.  - Improving on IV lasix. JVD improved but remains, will give additional lasix 40mg  IV today and monitor closely. Cr slightly up. Check REDS vest if possible, d/w RN.  - Monitor I/O (-~2L yesterday), weights (43.9 >> 46.3kg now trending back down), BMP in AM   Acute respiratory failure with hypoxia secondary to pneumonia: Improved but recurrent.  - Will check ambulatory pulse oximetry   Left lower extremity wound, cellulitis: Chronic.  - On abx as above - Continue local wound management, HH-RN to resume at DC.    Persistent A-flutter: Rate is controlled. Rhythm control strategies considered per cardiology though pt wishes to avoid cardioversion, etc. and poor candidate for many antiarrhythmics.  - Continue metoprolol.   - Continue eliquis.     History of COPD as well as bronchiectasis: No exacerbation currently. Continue inhalers.   Left lower lobe cavitary nodule: Incidental finding. - Patient does not want further workup. Outpatient CT recommended in 3 to 4 weeks, shared w/patient.   HTN:  - Continue metoprolol   PAD s/p revascularization by Dr. Trula Slade.  - Outpatient follow-up with vascular surgery recommended. Pulses palpable.   HLD:  - Continue statin.   Hypokalemia:  - supplement and monitor  Essential tremor:  - Outpatient follow-up with neurology recommended.   History of breast cancer: History of bilateral mastectomy chemo and radiation. Currently in remission. - Outpatient follow-up with oncology.   Osteopenia as well as osteoporosis. - Outpatient follow-up.   Patrecia Pour, MD [facility] [email or web address]  06/23/2022, 11:51 AM

## 2022-06-23 NOTE — Progress Notes (Signed)
Assisted patient with walking to the restroom with rolling walker. Tolerated well. Dressing changed on left lower leg. Patient slept most of the night. No complaints of pain or discomfort.  Plan of care ongoing.

## 2022-06-23 NOTE — Progress Notes (Signed)
   06/23/22 1000  ReDS Vest / Clip  Station Marker A  Ruler Value 27  ReDS Value Range < 36

## 2022-06-24 DIAGNOSIS — J9601 Acute respiratory failure with hypoxia: Secondary | ICD-10-CM | POA: Diagnosis not present

## 2022-06-24 LAB — BASIC METABOLIC PANEL
Anion gap: 11 (ref 5–15)
BUN: 25 mg/dL — ABNORMAL HIGH (ref 8–23)
CO2: 23 mmol/L (ref 22–32)
Calcium: 8.4 mg/dL — ABNORMAL LOW (ref 8.9–10.3)
Chloride: 102 mmol/L (ref 98–111)
Creatinine, Ser: 1.28 mg/dL — ABNORMAL HIGH (ref 0.44–1.00)
GFR, Estimated: 41 mL/min — ABNORMAL LOW (ref >60)
Glucose, Bld: 87 mg/dL (ref 70–99)
Potassium: 3.4 mmol/L — ABNORMAL LOW (ref 3.5–5.1)
Sodium: 136 mmol/L (ref 135–145)

## 2022-06-24 MED ORDER — FUROSEMIDE 20 MG PO TABS: 20.0000 mg | ORAL_TABLET | Freq: Every day | ORAL | 0 refills | Status: DC

## 2022-06-24 MED ORDER — POTASSIUM CHLORIDE CRYS ER 20 MEQ PO TBCR
20.0000 meq | EXTENDED_RELEASE_TABLET | Freq: Once | ORAL | Status: AC
  Administered 2022-06-24: 20 meq via ORAL
  Filled 2022-06-24: qty 1

## 2022-06-24 MED ORDER — CEPHALEXIN 500 MG PO CAPS: 500.0000 mg | ORAL_CAPSULE | Freq: Two times a day (BID) | ORAL | 0 refills | Status: AC

## 2022-06-24 NOTE — Progress Notes (Signed)
Dressing changed to left lower leg.  Patient tolerated well.

## 2022-06-24 NOTE — Discharge Summary (Signed)
Physician Discharge Summary   Patient: [name] MRN: [id number] DOB: June 15, 1940  Admit date:     06/18/2022  Discharge date: 06/24/22  Discharge Physician: Patrecia Pour   PCP: Celene Squibb, MD   Recommendations at discharge:  Follow up with PCP in next 1-2 weeks with repeat labs and monitoring LLE wound.  Follow up with cardiology for chronic HFrEF, atrial flutter. Consider repeat chest CT for cavitary pulmonary nodule on imaging here. The patient is aware of this recommendation.   Discharge Diagnoses: Principal Problem:   Acute respiratory failure with hypoxia (HCC) Active Problems:   Hyperlipidemia   Persistent atrial fibrillation (HCC)   Sepsis due to undetermined organism ([place])   Aspiration pneumonitis (HCC)   Acute on chronic HFrEF (heart failure with reduced ejection fraction) [facility])   Hospital Course: [name] is an 82 y.o. female with a history of COPD, bronchiectasis, HTN, HLD, PAD, HFrEF, essential tremor, atrial flutter, and breast CA s/p bilateral mastectomy, chemo, radiation who presented to the hospital with complaints of cough and shortness of breath. Found to have sepsis secondary to pneumonia and acute pulmonary edema. With diuresis and antibiotics, hypoxia has resolved, respiratory effort normalized, and volume status is felt to be optimized.  Assessment and Plan: Sepsis secondary to pneumonia:  Had leukocytosis at the time of admission secondary to pneumonia with elevated procalcitonin level.  Was also tachypneic and tachycardic at the time of admission. This is secondary to pneumonia. Treated with IV azithromycin and zosyn.  - Completed azithromycin at 5 days - Completed zosyn x7 days Procalcitonin continues improving    Acute on chronic HFrEF: Treated with IV Lasix which was stopped. Pt's echo demonstrates LVEF 40-45%, may be related to atrial flutter chronically and acutely with elevated rates as she experienced return of pulmonary edema several days  into admission requiring IV diuresis.   - Control atrial rate, consulted cardiology for recommendations. Will discharge on standing lasix and recommend return precautions, cardiology follow up.   - Monitor weights (43.9 >> 46.3kg >> 43.4kg at discharge)   Acute respiratory failure with hypoxia secondary to pneumonia: Resolved. 95% on room air with ambulation around the unit yesterday.   Left lower extremity wound, cellulitis: Chronic.  - On abx as above, will continue keflex x5 days - Continue local wound management, HH-RN to resume at DC.    Persistent A-flutter: Rate is controlled. Rhythm control strategies considered per cardiology though pt wishes to avoid cardioversion, etc. and poor candidate for many antiarrhythmics.  - Continue metoprolol 75mg  BID.   - Continue eliquis.     History of COPD as well as bronchiectasis: No exacerbation currently. - Continue inhalers.   Left lower lobe cavitary nodule: Incidental finding. - Patient does not want further workup. Outpatient CT recommended in 3 to 4 weeks, shared w/patient.   HTN:  - Continue metoprolol   PAD s/p revascularization by Dr. Trula Slade.  - Outpatient follow-up with vascular surgery recommended. Pulses palpable.   HLD:  - Continue statin.   Hypokalemia:  - Continue standing home supplement and recommend monitoring at follow up   Essential tremor:  - Outpatient follow-up with neurology recommended.   History of breast cancer: History of bilateral mastectomy chemo and radiation. Currently in remission. - Outpatient follow-up with oncology.   Osteopenia as well as osteoporosis. - Outpatient follow-up.   Consultants: Cardiology, Dr. Dellia Cloud Procedures performed: None  Disposition: Home Diet recommendation:  Cardiac diet DISCHARGE MEDICATION: Allergies as of 06/24/2022  Reactions   Iodine Rash, Other (See Comments)   BETADINE Rash/burning, blisters on skin. Burns skin   Iohexol Rash, Other (See  Comments)   Blisters; PT NEEDS 13-HOUR PREP   Sulfa Antibiotics Nausea And Vomiting   Petroleum Gauze Non-woven 3x9" [wound Dressings] Other (See Comments)   BURNING SENSATION, RED SKIN   Povidone-iodine Other (See Comments)   Burns skin   Wound Dressing Adhesive Other (See Comments)   Burns skin   Tape Itching, Rash, Other (See Comments)   "DO NOT USE ADHESIVE TAPE" Not even a Band Aid" NO PAPER TAPE Burns skin Skin is very very thin        Medication List     TAKE these medications    acetaminophen 325 MG tablet Commonly known as: TYLENOL Take 2 tablets (650 mg total) by mouth every 6 (six) hours as needed for mild pain (or Fever >/= 101).   ascorbic acid 500 MG tablet Commonly known as: VITAMIN C Take 500 mg by mouth daily.   CALCIUM PO Take 1,200 mg by mouth daily.   cephALEXin 500 MG capsule Commonly known as: KEFLEX Take 1 capsule (500 mg total) by mouth 2 (two) times daily for 5 days.   clopidogrel 75 MG tablet Commonly known as: PLAVIX TAKE 1 TABLET EVERY MORNING   denosumab 60 MG/ML Sosy injection Commonly known as: PROLIA Inject 60 mg into the skin every 6 (six) months.   Denta 5000 Plus 1.1 % Crea dental cream Generic drug: sodium fluoride SMARTSIG:Sparingly By Mouth Daily   Eliquis 2.5 MG Tabs tablet Generic drug: apixaban TAKE 1 TABLET TWICE A DAY   furosemide 20 MG tablet Commonly known as: LASIX Take 1 tablet (20 mg total) by mouth daily. What changed: additional instructions   Incruse Ellipta 62.5 MCG/ACT Aepb Generic drug: umeclidinium bromide Inhale 1 puff into the lungs daily.   IRON PO Take 65 mg by mouth daily at 6 (six) AM.   MAGNESIUM OXIDE PO Take 500 mg by mouth every morning.   metoprolol tartrate 25 MG tablet Commonly known as: LOPRESSOR TAKE 3 TABLETS TWICE A DAY   multivitamin with minerals Tabs tablet Take 1 tablet by mouth every morning.   pantoprazole 40 MG tablet Commonly known as: PROTONIX TAKE 1 TABLET  DAILY   potassium chloride 10 MEQ tablet Commonly known as: KLOR-CON M Take 1 tablet (10 mEq total) by mouth daily.   pravastatin 40 MG tablet Commonly known as: PRAVACHOL Take 1 tablet (40 mg total) by mouth daily.   Santyl 250 UNIT/GM ointment Generic drug: collagenase APPLY TO AFFECTED AREAS ONCE DAILY.   VITAMIN D-3 PO Take 3 tablets by mouth every morning. 3000 units total        Follow-up Information     Celene Squibb, MD Follow up.   Specialty: Internal Medicine Contact information: [address] [facility] [address] [phone]         Josue Hector, MD Follow up.   Specialty: Cardiology Contact information: [id number] [place] [address] [phone]                Discharge Exam: Danley Danker Weights   06/22/22 0534 06/23/22 0552 06/24/22 0500  Weight: 46.3 kg 44.2 kg 43.4 kg  BP (!) 140/79 (BP Location: Left Arm)   Pulse 66   Temp 98.1 F (36.7 C) (Oral)   Resp 18   Ht 5\' 1"  (1.549 m)   Wt 43.4 kg  SpO2 97%   BMI 18.08 kg/m   No distress, pleasant, interactive No MRG, minimal LLE edema, none on right. JVD is resolved, negative HJR.  Good aeration at bases, no wheezes, nonlabored  Condition at discharge: stable  The results of significant diagnostics from this hospitalization (including imaging, microbiology, ancillary and laboratory) are listed below for reference.   Imaging Studies: DG CHEST PORT 1 VIEW  Result Date: 06/22/2022 CLINICAL DATA:  Dyspnea EXAM: PORTABLE CHEST 1 VIEW COMPARISON:  Radiograph and CT from 4 days ago FINDINGS: Diffuse interstitial opacity with Kerley lines and small bilateral pleural effusion. Bilateral fissure thickening. Cardiomegaly and aortic tortuosity. Pulmonary opacification and pleural fluid has progressed. No pneumothorax. Postoperative left breast and axilla. IMPRESSION: Worsening pulmonary edema and pleural effusion. Electronically Signed   By: Jorje Guild M.D.   On:  06/22/2022 04:09   CT CHEST ABDOMEN PELVIS WO CONTRAST  Result Date: 06/18/2022 CLINICAL DATA:  Shortness of breath and nausea. EXAM: CT CHEST, ABDOMEN AND PELVIS WITHOUT CONTRAST TECHNIQUE: Multidetector CT imaging of the chest, abdomen and pelvis was performed following the standard protocol without IV contrast. RADIATION DOSE REDUCTION: This exam was performed according to the departmental dose-optimization program which includes automated exposure control, adjustment of the mA and/or kV according to patient size and/or use of iterative reconstruction technique. COMPARISON:  CT chest 07/11/2015. FINDINGS: CT CHEST FINDINGS Cardiovascular: Atherosclerotic calcification of the aorta, aortic valve and coronary arteries. Heart is enlarged. No pericardial effusion. Mediastinum/Nodes: Mediastinal lymph nodes measure up to 10 mm in the low right paratracheal station, as before. Hilar regions are difficult to evaluate without IV contrast. Calcified subcarinal lymph node. Surgical clips in the left axilla. No axillary adenopathy. Esophagus is grossly unremarkable. Lungs/Pleura: Pleuroparenchymal scarring in the apical right upper lobe. Centrilobular emphysema. Additional pleuroparenchymal scarring the lower right hemithorax. Basilar septal thickening with trace right and small left pleural effusions. Small cavitary nodule in the left lower lobe measures 0.9 x 1.1 cm (3/106), new from 07/11/2015. Adjacent 3 mm lateral left lower lobe nodule (3/109), unchanged. Per Fleischner Society guidelines, no follow-up is necessary. Left lower lobe dependent atelectasis. Cylindrical bronchiectasis. Debris is seen in the airway. Musculoskeletal: Degenerative changes in the spine. Old T10 and T12 compression fractures. T5 superior endplate compression fracture, age indeterminate. No worrisome lytic or sclerotic lesions. CT ABDOMEN PELVIS FINDINGS Hepatobiliary: Liver is unremarkable. Sludge or stones in the gallbladder. No biliary  ductal dilatation. Pancreas: Negative. Spleen: Negative. Adrenals/Urinary Tract: Adrenal glands are unremarkable. Stones in the kidneys bilaterally. Low-attenuation lesion in the lower pole right kidney. No specific follow-up necessary. Kidneys are otherwise unremarkable. Ureters are decompressed. Bladder is grossly unremarkable. Stomach/Bowel: Moderate hiatal hernia. Stomach, small bowel, appendix and colon are unremarkable. Vascular/Lymphatic: Atherosclerotic calcification of the aorta. Left inguinal lymph nodes measure up to 1.3 cm. No additional pathologically enlarged lymph nodes. Reproductive: Calcified uterine fibroids.  No adnexal mass. Other: No free fluid. Small right inguinal hernia contains fat. Mesenteries and peritoneum are unremarkable. Musculoskeletal: Old right pubic rami fractures. Osteopenia. Degenerative changes in the spine. Severe dextroconvex scoliosis of the lumbar spine. Degenerative changes in the hips. No worrisome lytic or sclerotic lesions. IMPRESSION: 1. Cavitary left lower lobe nodule is new from 07/11/2015 and worrisome for malignancy. An infectious etiology could also have this appearance. Recommend short-term follow-up CT chest without contrast in 3-4 weeks in further initial evaluation. 2. Congestive heart failure. 3. Borderline left inguinal adenopathy, possibly reactive. 4. Cylindrical bronchiectasis. 5. Gallbladder sludge and/or stones. 6. Bilateral renal stones.  7. Moderate hiatal hernia. 8. Age-indeterminate T5 superior endplate compression fracture. 9. Aortic atherosclerosis (ICD10-I70.0). 10.  Emphysema (ICD10-J43.9). Electronically Signed   By: Lorin Picket M.D.   On: 06/18/2022 09:25   DG Chest Port 1 View  Result Date: 06/18/2022 CLINICAL DATA:  82 year old female who woke at 0400 hours with chills and shortness of breath. EXAM: PORTABLE CHEST 1 VIEW COMPARISON:  Portable chest 05/25/2022 and earlier. FINDINGS: Portable AP upright view at 0735 hours. Chronic  hyperinflation. Chronic right lung bronchiectasis and scarring demonstrated by CT in 2017. Some centrilobular emphysema also evident at that time. Mildly lower lung volumes today. Calcified aortic atherosclerosis. Stable cardiac size and mediastinal contours. Cardiac size within normal limits. No pneumothorax, pulmonary edema, pleural effusion or acute pulmonary opacity. Visualized tracheal air column is within normal limits. Osteopenia. No acute osseous abnormality identified. Left axillary no dissection clips are stable. IMPRESSION: Chronic lung disease with Bronchiectasis, Emphysema (ICD10-J43.9), scarring. No acute cardiopulmonary abnormality. Electronically Signed   By: Genevie Ann M.D.   On: 06/18/2022 07:44    Microbiology: Results for orders placed or performed during the hospital encounter of 06/18/22  Resp panel by RT-PCR (RSV, Flu A&B, Covid) Anterior Nasal Swab     Status: None   Collection Time: 06/18/22  8:06 AM   Specimen: Anterior Nasal Swab  Result Value Ref Range Status   SARS Coronavirus 2 by RT PCR NEGATIVE NEGATIVE Final    Comment: (NOTE) SARS-CoV-2 target nucleic acids are NOT DETECTED.  The SARS-CoV-2 RNA is generally detectable in upper respiratory specimens during the acute phase of infection. The lowest concentration of SARS-CoV-2 viral copies this assay can detect is 138 copies/mL. A negative result does not preclude SARS-Cov-2 infection and should not be used as the sole basis for treatment or other patient management decisions. A negative result may occur with  improper specimen collection/handling, submission of specimen other than nasopharyngeal swab, presence of viral mutation(s) within the areas targeted by this assay, and inadequate number of viral copies(<138 copies/mL). A negative result must be combined with clinical observations, patient history, and epidemiological information. The expected result is Negative.  Fact Sheet for Patients:   EntrepreneurPulse.com.au  Fact Sheet for Healthcare Providers:  IncredibleEmployment.be  This test is no t yet approved or cleared by the [place] FDA and  has been authorized for detection and/or diagnosis of SARS-CoV-2 by FDA under an Emergency Use Authorization (EUA). This EUA will remain  in effect (meaning this test can be used) for the duration of the COVID-19 declaration under Section 564(b)(1) of the Act, 21 U.S.C.section 360bbb-3(b)(1), unless the authorization is terminated  or revoked sooner.       Influenza A by PCR NEGATIVE NEGATIVE Final   Influenza B by PCR NEGATIVE NEGATIVE Final    Comment: (NOTE) The Xpert Xpress SARS-CoV-2/FLU/RSV plus assay is intended as an aid in the diagnosis of influenza from Nasopharyngeal swab specimens and should not be used as a sole basis for treatment. Nasal washings and aspirates are unacceptable for Xpert Xpress SARS-CoV-2/FLU/RSV testing.  Fact Sheet for Patients: EntrepreneurPulse.com.au  Fact Sheet for Healthcare Providers: IncredibleEmployment.be  This test is not yet approved or cleared by the [place] FDA and has been authorized for detection and/or diagnosis of SARS-CoV-2 by FDA under an Emergency Use Authorization (EUA). This EUA will remain in effect (meaning this test can be used) for the duration of the COVID-19 declaration under Section 564(b)(1) of the Act, 21 U.S.C. section 360bbb-3(b)(1), unless the authorization is  terminated or revoked.     Resp Syncytial Virus by PCR NEGATIVE NEGATIVE Final    Comment: (NOTE) Fact Sheet for Patients: EntrepreneurPulse.com.au  Fact Sheet for Healthcare Providers: IncredibleEmployment.be  This test is not yet approved or cleared by the [place] FDA and has been authorized for detection and/or diagnosis of SARS-CoV-2 by FDA under an Emergency Use  Authorization (EUA). This EUA will remain in effect (meaning this test can be used) for the duration of the COVID-19 declaration under Section 564(b)(1) of the Act, 21 U.S.C. section 360bbb-3(b)(1), unless the authorization is terminated or revoked.  Performed at [facility], [address] [place], [place] [address]   Culture, blood (routine x 2)     Status: None   Collection Time: 06/18/22 10:36 AM   Specimen: BLOOD  Result Value Ref Range Status   Specimen Description BLOOD RAC  Final   Special Requests   Final    BOTTLES DRAWN AEROBIC AND ANAEROBIC Blood Culture results may not be optimal due to an excessive volume of blood received in culture bottles   Culture   Final    NO GROWTH 5 DAYS Performed at [facility], [address] [place], [place] [address]    Report Status 06/23/2022 FINAL  Final  Culture, blood (routine x 2)     Status: None   Collection Time: 06/18/22 10:36 AM   Specimen: BLOOD  Result Value Ref Range Status   Specimen Description BLOOD RT Forearm  Final   Special Requests   Final    BOTTLES DRAWN AEROBIC AND ANAEROBIC Blood Culture adequate volume   Culture   Final    NO GROWTH 5 DAYS Performed at [facility], [address] [place], [place] [address]    Report Status 06/23/2022 FINAL  Final  MRSA Next Gen by PCR, Nasal     Status: None   Collection Time: 06/18/22 12:55 PM   Specimen: Nasal Mucosa; Nasal Swab  Result Value Ref Range Status   MRSA by PCR Next Gen NOT DETECTED NOT DETECTED Final    Comment: (NOTE) The GeneXpert MRSA Assay (FDA approved for NASAL specimens only), is one component of a comprehensive MRSA colonization surveillance program. It is not intended to diagnose MRSA infection nor to guide or monitor treatment for MRSA infections. Test performance is not FDA approved in patients less than 60 years old. Performed at [facility], [address] [place], [place] [address]     Labs: CBC: Recent Labs  Lab 06/18/22 [phone]  06/19/22 0451 06/20/22 1517 06/21/22 0436 06/22/22 0742  WBC 20.0* 15.1* 8.6 7.8 10.4  NEUTROABS 18.0*  --   --  5.8  --   HGB 10.9* 8.3* 8.7* 8.6* 10.1*  HCT 36.3 26.7* 27.7* 26.9* 31.9*  MCV 105.8* 100.4* 100.0 100.7* 99.4  PLT 271 189 209 224 0000000   Basic Metabolic Panel: Recent Labs  Lab 06/19/22 0451 06/20/22 0456 06/20/22 1517 06/21/22 0436 06/22/22 0742 06/23/22 0609 06/24/22 0455  NA 131*   < > 136 135 138 136 136  K 3.7   < > 3.9 3.6 4.3 3.3* 3.4*  CL 104   < > 106 105 104 102 102  CO2 21*   < > 21* 22 22 23 23   GLUCOSE 87   < > 82 96 118* 78 87  BUN 34*   < > 29* 24* 24* 24* 25*  CREATININE 1.28*   < > 1.40* 1.17* 1.20* 1.43* 1.28*  CALCIUM 7.4*   < >  7.7* 7.6* 8.5* 7.7* 8.4*  MG 1.9  --  2.2 2.2  --   --   --    < > = values in this interval not displayed.   Liver Function Tests: Recent Labs  Lab 06/18/22 0752 06/21/22 0436  AST 60* 23  ALT 32 22  ALKPHOS 78 61  BILITOT 0.9 0.5  PROT 7.8 6.4*  ALBUMIN 3.2* 2.5*   CBG: Recent Labs  Lab 06/22/22 0730  GLUCAP 116*    Discharge time spent: greater than 30 minutes.  Signed: Patrecia Pour, MD [facility] 06/24/2022

## 2022-06-25 ENCOUNTER — Other Ambulatory Visit: Payer: Self-pay | Admitting: *Deleted

## 2022-06-25 MED ORDER — INCRUSE ELLIPTA 62.5 MCG/ACT IN AEPB: 1.0000 | INHALATION_SPRAY | Freq: Every day | RESPIRATORY_TRACT | 3 refills | Status: DC

## 2022-06-27 NOTE — H&P (View-Only) (Signed)
Patient ID: [name], female   DOB: 06/06/1940, 82 y.o.   MRN: [id number]     82 y.o. 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 She has been asymptomatic and last saw him 05/01/21 LICA 50-69% duplex 03/16/21 GI bleed 02/20/15 transfused 4 units INR over 10 coumadin changed to eliquis. Had gastric and duodenal ulcers. Chronic lung disease with necrotizing pneumonia moderate COPD sees Ramaschawmy from pulmonary   Still issues with falling Using rolling wheel chair now  Feels her UE tremors are worse  Seen by Dr Tat 12/12/17 ? Decrease dose of amiodarone furtherOr change eliquis to pradaxa and try primidone Also noted left foot drop ? From lumbar radiculopathy  Told not to start Evinity for osteoporosis due to increased risk of MI/Stroke Dr Hall suggested Getting Prolia now   Avapro d/c 05/31/20 for elevated K/Cr  1.23/54. Normalized on labs 10/24/20 Primary wanted to start Lisinopril for proteinuria but not a great idea given above and age   Hospitalized for sepsis 9/17-26/2023 left foot cellulitis with amputation of left great toe She Had some atrial flutter and volume overload VVS stent to Left SFA to improve inflow with popliteal angioplasty Finished Augmentin and Doxycycline 01/09/22   ABI 01/15/22 right 1.08 and left 0.99 TTE 05/24/22 EF 40-45% mild/mod MR mild AR AV sclerosis nl RV  Hospitalized AP 06/18/22 with sepsis from pneumonia and volume overload with CHF and EF newly decreased 40-45% by TTE see above ECG with atypical flutter Diuresed and no attempts at DCC On low dose eliquis due to age, weight and CRF Cr 1.43 BNP 2405  She continues to be very tenuous. Flutter rapid today She is willing to try amiodarone again as she has had worsening tremors even when not taking it Has f/u with Dr Tat soon Discussed loading with amiodarone and doing DCC in 3-4 weeks She is at higher risk of CHF while in flutter and EF has gone done    She understands risk of TMP and intubation with short acting anesthetic like propofol No missed doses of eliquis     ROS: Denies fever, malais, weight loss, blurry vision, decreased visual acuity, cough, sputum, SOB, hemoptysis, pleuritic pain, palpitaitons, heartburn, abdominal pain, melena, lower extremity edema, claudication, or rash.  All other systems reviewed and negative   General: There were no vitals taken for this visit. Affect appropriate Frail thin chronically ill white female  HEENT: normal Neck supple with no adenopathy JVP normal bilateral carotid and subclavian  bruits no thyromegaly Lungs clear with no wheezing and good diaphragmatic motion Heart:  S1/S2 no murmur, no rub, gallop or click PMI normal post left mastectomy  Abdomen: benighn, BS positve, no tenderness, no AAA no bruit.  No HSM or HJR Decreased pulse and BP in right arm  No edema Neuro UE tremors left foot drop  Skin bruising over arms  Post recent left great toe amputation      Current Outpatient Medications  Medication Sig Dispense Refill   acetaminophen (TYLENOL) 325 MG tablet Take 2 tablets (650 mg total) by mouth every 6 (six) hours as needed for mild pain (or Fever >/= 101).     ascorbic acid (VITAMIN C) 500 MG tablet Take 500 mg by mouth daily.     CALCIUM PO Take 1,200 mg by mouth daily.     cephALEXin (KEFLEX) 500 MG capsule Take 1 capsule (500 mg total) by mouth 2 (two) times daily for 5 days.   10 capsule 0   Cholecalciferol (VITAMIN D-3 PO) Take 3 tablets by mouth every morning. 3000 units total     clopidogrel (PLAVIX) 75 MG tablet TAKE 1 TABLET EVERY MORNING 90 tablet 3   denosumab (PROLIA) 60 MG/ML SOSY injection Inject 60 mg into the skin every 6 (six) months.     DENTA 5000 PLUS 1.1 % CREA dental cream SMARTSIG:Sparingly By Mouth Daily     ELIQUIS 2.5 MG TABS tablet TAKE 1 TABLET TWICE A DAY 180 tablet 1   Ferrous Sulfate (IRON PO) Take 65 mg by mouth daily at 6 (six) AM.      furosemide (LASIX) 20 MG tablet Take 1 tablet (20 mg total) by mouth daily. 30 tablet 0   MAGNESIUM OXIDE PO Take 500 mg by mouth every morning.     metoprolol tartrate (LOPRESSOR) 25 MG tablet TAKE 3 TABLETS TWICE A DAY 540 tablet 3   Multiple Vitamin (MULTIVITAMIN WITH MINERALS) TABS tablet Take 1 tablet by mouth every morning.     pantoprazole (PROTONIX) 40 MG tablet TAKE 1 TABLET DAILY 90 tablet 3   potassium chloride (KLOR-CON M) 10 MEQ tablet Take 1 tablet (10 mEq total) by mouth daily. 30 tablet 2   pravastatin (PRAVACHOL) 40 MG tablet Take 1 tablet (40 mg total) by mouth daily. 90 tablet 3   SANTYL 250 UNIT/GM ointment APPLY TO AFFECTED AREAS ONCE DAILY. 30 g 0   umeclidinium bromide (INCRUSE ELLIPTA) 62.5 MCG/ACT AEPB Inhale 1 puff into the lungs daily. 90 each 3   No current facility-administered medications for this visit.    Allergies  Iodine, Iohexol, Sulfa antibiotics, Petroleum gauze non-woven 3x9" [wound dressings], Povidone-iodine, Wound dressing adhesive, and Tape  Electrocardiogram:  11/18/19 Afib rate 100 nonspecific ST changes   Assessment and Plan Vascular: right innominate occlusion with moderate LICA stenosis f/u duplex ordered  f/u [facility]   PAF:  Continue eliquis lower to 2.5 bid if any further bleeding issues She has had more recent refractory flutter She is not a candidate for Multaq due to CHF and decreased EF and CAD not a candidate for flecainide or propofonone. Renal failure precludes any reasonable dose of Tikosyn Reload with amiodarone 200 mg bid and then decrease to 200 mg daily post DCC Scheduled for 08/03/22 Orders written lab called Risks including stroke , TMP, intubation discussed willing to proceed No missed doses of DOAC   Chol:  On statin. mevacor increased last visit LDL 90 10/24/20    HTN:  stable off avapro   Pulmonary:  Quit smoking 12 years ago Gold Class 2 COPD f/u pulmonary   Edema:  No DVT by duplex norvasc decreased stable  Anemia :   History of gastric ulcers continue protonix f/u GI  Lab Results  Component Value Date   HCT 31.9 (L) 06/22/2022   CAD:  No chest pain extensive by CT last cath 2011 with no hemodynamically significant disease Continue plavix not on ASA due to GI bleed and need for eliquis for PAF  Tremor:  Discussed changing from eliquis to pradaxa and starting primidone Defers Amiodarone d/c makes tremors worse   Vascular:  f/u [facility] left great toe amputation with SFA stenting and Popliteal balloon. Post Rx ABI"s in normal range   DCM:  new onset decreased EF 40-45% by TTE 05/24/22 likely worse due to atypical flutter with limited no good options for AAT. Continue lopressor and current dose of lasix  See above regarding attempt at DCC  Carotid duplex     F/u with me in 6 months     Bernis Schreur 

## 2022-06-27 NOTE — Progress Notes (Signed)
Patient ID: [name], female   DOB: 08/20/1940, 82 y.o.   MRN: [id number]     82 y.o. 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 She has been asymptomatic and last saw him [id number] LICA 0000000 duplex [id number] GI bleed 02/20/15 transfused 4 units INR over 10 coumadin changed to eliquis. Had gastric and duodenal ulcers. Chronic lung disease with necrotizing pneumonia moderate COPD sees Ramaschawmy from pulmonary   Still issues with falling Using rolling wheel chair now  Feels her UE tremors are worse  Seen by Dr Tat 12/12/17 ? Decrease dose of amiodarone furtherOr change eliquis to pradaxa and try primidone Also noted left foot drop ? From lumbar radiculopathy  Told not to start Evinity for osteoporosis due to increased risk of MI/Stroke Dr [place] Crane suggested Getting Prolia now   Avapro d/c 05/31/20 for elevated K/Cr  1.23/54. Normalized on labs 10/24/20 Primary wanted to start Lisinopril for proteinuria but not a great idea given above and age   Hospitalized for sepsis 9/17-26/2023 left foot cellulitis with amputation of left great toe She Had some atrial flutter and volume overload VVS stent to Left SFA to improve inflow with popliteal angioplasty Finished Augmentin and Doxycycline 01/09/22   ABI 01/15/22 right 1.08 and left 0.99 TTE 05/24/22 EF 40-45% mild/mod MR mild AR AV sclerosis nl RV  Hospitalized AP 06/18/22 with sepsis from pneumonia and volume overload with CHF and EF newly decreased 40-45% by TTE see above ECG with atypical flutter Diuresed and no attempts at [facility] On low dose eliquis due to age, weight and CRF Cr 1.43 BNP 2405  She continues to be very tenuous. Flutter rapid today She is willing to try amiodarone again as she has had worsening tremors even when not taking it Has f/u with Dr Tat soon Discussed loading with amiodarone and doing [facility] in 3-4 weeks She is at higher risk of CHF while in flutter and EF has gone done    She understands risk of TMP and intubation with short acting anesthetic like propofol No missed doses of eliquis     ROS: Denies fever, malais, weight loss, blurry vision, decreased visual acuity, cough, sputum, SOB, hemoptysis, pleuritic pain, palpitaitons, heartburn, abdominal pain, melena, lower extremity edema, claudication, or rash.  All other systems reviewed and negative   General: There were no vitals taken for this visit. Affect appropriate Frail thin chronically ill white female  HEENT: normal Neck supple with no adenopathy JVP normal bilateral carotid and subclavian  bruits no thyromegaly Lungs clear with no wheezing and good diaphragmatic motion Heart:  S1/S2 no murmur, no rub, gallop or click PMI normal post left mastectomy  Abdomen: benighn, BS positve, no tenderness, no AAA no bruit.  No HSM or HJR Decreased pulse and BP in right arm  No edema Neuro UE tremors left foot drop  Skin bruising over arms  Post recent left great toe amputation      Current Outpatient Medications  Medication Sig Dispense Refill   acetaminophen (TYLENOL) 325 MG tablet Take 2 tablets (650 mg total) by mouth every 6 (six) hours as needed for mild pain (or Fever >/= 101).     ascorbic acid (VITAMIN C) 500 MG tablet Take 500 mg by mouth daily.     CALCIUM PO Take 1,200 mg by mouth daily.     cephALEXin (KEFLEX) 500 MG capsule Take 1 capsule (500 mg total) by mouth 2 (two) times daily for 5 days.  10 capsule 0   Cholecalciferol (VITAMIN D-3 PO) Take 3 tablets by mouth every morning. 3000 units total     clopidogrel (PLAVIX) 75 MG tablet TAKE 1 TABLET EVERY MORNING 90 tablet 3   denosumab (PROLIA) 60 MG/ML SOSY injection Inject 60 mg into the skin every 6 (six) months.     DENTA 5000 PLUS 1.1 % CREA dental cream SMARTSIG:Sparingly By Mouth Daily     ELIQUIS 2.5 MG TABS tablet TAKE 1 TABLET TWICE A DAY 180 tablet 1   Ferrous Sulfate (IRON PO) Take 65 mg by mouth daily at 6 (six) AM.      furosemide (LASIX) 20 MG tablet Take 1 tablet (20 mg total) by mouth daily. 30 tablet 0   MAGNESIUM OXIDE PO Take 500 mg by mouth every morning.     metoprolol tartrate (LOPRESSOR) 25 MG tablet TAKE 3 TABLETS TWICE A DAY 540 tablet 3   Multiple Vitamin (MULTIVITAMIN WITH MINERALS) TABS tablet Take 1 tablet by mouth every morning.     pantoprazole (PROTONIX) 40 MG tablet TAKE 1 TABLET DAILY 90 tablet 3   potassium chloride (KLOR-CON M) 10 MEQ tablet Take 1 tablet (10 mEq total) by mouth daily. 30 tablet 2   pravastatin (PRAVACHOL) 40 MG tablet Take 1 tablet (40 mg total) by mouth daily. 90 tablet 3   SANTYL 250 UNIT/GM ointment APPLY TO AFFECTED AREAS ONCE DAILY. 30 g 0   umeclidinium bromide (INCRUSE ELLIPTA) 62.5 MCG/ACT AEPB Inhale 1 puff into the lungs daily. 90 each 3   No current facility-administered medications for this visit.    Allergies  Iodine, Iohexol, Sulfa antibiotics, Petroleum gauze non-woven 3x9" [wound dressings], Povidone-iodine, Wound dressing adhesive, and Tape  Electrocardiogram:  11/18/19 Afib rate 100 nonspecific ST changes   Assessment and Plan Vascular: right innominate occlusion with moderate LICA stenosis f/u duplex ordered  f/u [facility]   PAF:  Continue eliquis lower to 2.5 bid if any further bleeding issues She has had more recent refractory flutter She is not a candidate for Multaq due to CHF and decreased EF and CAD not a candidate for flecainide or propofonone. Renal failure precludes any reasonable dose of Tikosyn Reload with amiodarone 200 mg bid and then decrease to 200 mg daily post [facility] Scheduled for 08/03/22 Orders written lab called Risks including stroke , TMP, intubation discussed willing to proceed No missed doses of DOAC   Chol:  On statin. mevacor increased last visit LDL 90 10/24/20    HTN:  stable off avapro   Pulmonary:  Quit smoking 12 years ago Gold Class 2 COPD f/u pulmonary   Edema:  No DVT by duplex norvasc decreased stable  Anemia :   History of gastric ulcers continue protonix f/u GI  Lab Results  Component Value Date   HCT 31.9 (L) 06/22/2022   CAD:  No chest pain extensive by CT last cath 2011 with no hemodynamically significant disease Continue plavix not on ASA due to GI bleed and need for eliquis for PAF  Tremor:  Discussed changing from eliquis to pradaxa and starting primidone Defers Amiodarone d/c makes tremors worse   Vascular:  f/u [facility] left great toe amputation with SFA stenting and Popliteal balloon. Post Rx ABI"s in normal range   DCM:  new onset decreased EF 40-45% by TTE 05/24/22 likely worse due to atypical flutter with limited no good options for AAT. Continue lopressor and current dose of lasix  See above regarding attempt at [facility]  Carotid duplex  F/u with me in 6 months     Jenkins Rouge

## 2022-07-02 ENCOUNTER — Encounter: Payer: Self-pay | Admitting: Neurology

## 2022-07-03 ENCOUNTER — Other Ambulatory Visit (HOSPITAL_COMMUNITY): Payer: Self-pay | Admitting: Family Medicine

## 2022-07-03 DIAGNOSIS — R918 Other nonspecific abnormal finding of lung field: Secondary | ICD-10-CM

## 2022-07-04 ENCOUNTER — Other Ambulatory Visit: Payer: Self-pay | Admitting: Cardiovascular Disease

## 2022-07-04 ENCOUNTER — Encounter: Payer: Self-pay | Admitting: Neurology

## 2022-07-04 ENCOUNTER — Encounter: Payer: Self-pay | Admitting: Cardiovascular Disease

## 2022-07-04 ENCOUNTER — Ambulatory Visit: Payer: Medicare HMO | Attending: Cardiovascular Disease | Admitting: Cardiovascular Disease

## 2022-07-04 VITALS — BP 156/98 | HR 116 | Ht 62.0 in | Wt 95.8 lb

## 2022-07-04 DIAGNOSIS — I480 Paroxysmal atrial fibrillation: Secondary | ICD-10-CM

## 2022-07-04 DIAGNOSIS — R0989 Other specified symptoms and signs involving the circulatory and respiratory systems: Secondary | ICD-10-CM

## 2022-07-04 DIAGNOSIS — I10 Essential (primary) hypertension: Secondary | ICD-10-CM

## 2022-07-04 DIAGNOSIS — I484 Atypical atrial flutter: Secondary | ICD-10-CM

## 2022-07-04 DIAGNOSIS — R251 Tremor, unspecified: Secondary | ICD-10-CM

## 2022-07-04 DIAGNOSIS — I779 Disorder of arteries and arterioles, unspecified: Secondary | ICD-10-CM

## 2022-07-04 LAB — LAB REPORT - SCANNED: EGFR: 47

## 2022-07-04 MED ORDER — AMIODARONE HCL 200 MG PO TABS: 200.0000 mg | ORAL_TABLET | Freq: Two times a day (BID) | ORAL | 3 refills | Status: DC

## 2022-07-04 NOTE — Patient Instructions (Addendum)
Medication Instructions:  Your physician has recommended you make the following change in your medication:  1-START Amiodarone 200 mg by mouth twice daily.  *If you need a refill on your cardiac medications before your next appointment, please call your pharmacy*  Lab Work: If you have labs (blood work) drawn today and your tests are completely normal, you will receive your results only by: [place] (if you have MyChart) OR A paper copy in the mail If you have any lab test that is abnormal or we need to change your treatment, we will call you to review the results.  Testing/Procedures: Your physician has recommended that you have a Cardioversion (DCCV). Electrical Cardioversion uses a jolt of electricity to your heart either through paddles or wired patches attached to your chest. This is a controlled, usually prescheduled, procedure. Defibrillation is done under light anesthesia in the hospital, and you usually go home the day of the procedure. This is done to get your heart back into a normal rhythm. You are not awake for the procedure. Please see the instruction sheet given to you today.   Follow-Up: At [facility], you and your health needs are our priority.  As part of our continuing mission to provide you with exceptional heart care, we have created designated Provider Care Teams.  These Care Teams include your primary Cardiologist (physician) and Advanced Practice Providers (APPs -  Physician Assistants and Nurse Practitioners) who all work together to provide you with the care you need, when you need it.  We recommend signing up for the patient portal called "MyChart".  Sign up information is provided on this After Visit Summary.  MyChart is used to connect with patients for Virtual Visits (Telemedicine).  Patients are able to view lab/test results, encounter notes, upcoming appointments, etc.  Non-urgent messages can be sent to your provider as well.   To learn more about  what you can do with MyChart, go to [email or web address].    Your next appointment:   6 month(s)  Provider:   Jenkins Rouge, MD         Dear [name]  You are scheduled for a Cardioversion on Friday, April 26 with [name].  Please arrive at the [facility] (Main Entrance A) at [facility]: [address] [place], [place] [address] at 10:00 AM.   DIET:  Nothing to eat or drink after midnight except a sip of water with medications (see medication instructions below)  MEDICATION INSTRUCTIONS:  Continue taking your anticoagulant (blood thinner): Apixaban (Eliquis).  You will need to continue this after your procedure until you are told by your provider that it is safe to stop.    LABS:   Please go to [facility] for lab work on Monday April 22nd for BMET and CBC.  FYI:  For your safety, and to allow [place] to monitor your vital signs accurately during the surgery/procedure we request: If you have artificial nails, gel coating, SNS etc, please have those removed prior to your surgery/procedure. Not having the nail coverings /polish removed may result in cancellation or delay of your surgery/procedure.  You must have a responsible person to drive you home and stay in the waiting area during your procedure. Failure to do so could result in cancellation.  Bring your insurance cards.  *Special Note: Every effort is made to have your procedure done on time. Occasionally there are emergencies that occur at the hospital that may cause delays. Please be patient if  a delay does occur.

## 2022-07-09 ENCOUNTER — Emergency Department (HOSPITAL_COMMUNITY): Payer: Medicare HMO

## 2022-07-09 ENCOUNTER — Observation Stay (HOSPITAL_COMMUNITY)
Admission: EM | Admit: 2022-07-09 | Discharge: 2022-07-10 | Disposition: A | Discharge status: Home / Self Care | Payer: Medicare HMO | Attending: Family Medicine | Admitting: Family Medicine

## 2022-07-09 ENCOUNTER — Other Ambulatory Visit: Payer: Self-pay

## 2022-07-09 ENCOUNTER — Telehealth: Payer: Self-pay | Admitting: Cardiovascular Disease

## 2022-07-09 DIAGNOSIS — I5023 Acute on chronic systolic (congestive) heart failure: Principal | ICD-10-CM | POA: Diagnosis present

## 2022-07-09 DIAGNOSIS — Z1152 Encounter for screening for COVID-19: Secondary | ICD-10-CM | POA: Insufficient documentation

## 2022-07-09 DIAGNOSIS — S91002D Unspecified open wound, left ankle, subsequent encounter: Secondary | ICD-10-CM

## 2022-07-09 DIAGNOSIS — I110 Hypertensive heart disease with heart failure: Secondary | ICD-10-CM | POA: Diagnosis not present

## 2022-07-09 DIAGNOSIS — R0602 Shortness of breath: Secondary | ICD-10-CM | POA: Diagnosis present

## 2022-07-09 DIAGNOSIS — Z8673 Personal history of transient ischemic attack (TIA), and cerebral infarction without residual deficits: Secondary | ICD-10-CM | POA: Insufficient documentation

## 2022-07-09 DIAGNOSIS — K922 Gastrointestinal hemorrhage, unspecified: Secondary | ICD-10-CM | POA: Diagnosis not present

## 2022-07-09 DIAGNOSIS — I4819 Other persistent atrial fibrillation: Secondary | ICD-10-CM | POA: Diagnosis not present

## 2022-07-09 DIAGNOSIS — Z89412 Acquired absence of left great toe: Secondary | ICD-10-CM | POA: Diagnosis not present

## 2022-07-09 DIAGNOSIS — Z7902 Long term (current) use of antithrombotics/antiplatelets: Secondary | ICD-10-CM | POA: Insufficient documentation

## 2022-07-09 DIAGNOSIS — Z87891 Personal history of nicotine dependence: Secondary | ICD-10-CM | POA: Insufficient documentation

## 2022-07-09 DIAGNOSIS — S91009A Unspecified open wound, unspecified ankle, initial encounter: Secondary | ICD-10-CM | POA: Diagnosis present

## 2022-07-09 DIAGNOSIS — Z853 Personal history of malignant neoplasm of breast: Secondary | ICD-10-CM | POA: Diagnosis not present

## 2022-07-09 DIAGNOSIS — Z79899 Other long term (current) drug therapy: Secondary | ICD-10-CM | POA: Diagnosis not present

## 2022-07-09 DIAGNOSIS — Z7901 Long term (current) use of anticoagulants: Secondary | ICD-10-CM | POA: Diagnosis not present

## 2022-07-09 DIAGNOSIS — J449 Chronic obstructive pulmonary disease, unspecified: Secondary | ICD-10-CM | POA: Diagnosis present

## 2022-07-09 DIAGNOSIS — I509 Heart failure, unspecified: Secondary | ICD-10-CM

## 2022-07-09 LAB — CBC WITH DIFFERENTIAL/PLATELET
Abs Immature Granulocytes: 0.03 10*3/uL (ref 0.00–0.07)
Basophils Absolute: 0.1 10*3/uL (ref 0.0–0.1)
Basophils Relative: 1 %
Eosinophils Absolute: 0 10*3/uL (ref 0.0–0.5)
Eosinophils Relative: 0 %
HCT: 39.2 % (ref 36.0–46.0)
Hemoglobin: 12.4 g/dL (ref 12.0–15.0)
Immature Granulocytes: 0 %
Lymphocytes Relative: 9 %
Lymphs Abs: 0.9 10*3/uL (ref 0.7–4.0)
MCH: 32 pg (ref 26.0–34.0)
MCHC: 31.6 g/dL (ref 30.0–36.0)
MCV: 101.3 fL — ABNORMAL HIGH (ref 80.0–100.0)
Monocytes Absolute: 1.2 10*3/uL — ABNORMAL HIGH (ref 0.1–1.0)
Monocytes Relative: 12 %
Neutro Abs: 7.7 10*3/uL (ref 1.7–7.7)
Neutrophils Relative %: 78 %
Platelets: 268 10*3/uL (ref 150–400)
RBC: 3.87 MIL/uL (ref 3.87–5.11)
RDW: 19.7 % — ABNORMAL HIGH (ref 11.5–15.5)
WBC: 9.9 10*3/uL (ref 4.0–10.5)
nRBC: 0 % (ref 0.0–0.2)

## 2022-07-09 LAB — COMPREHENSIVE METABOLIC PANEL WITH GFR
ALT: 16 U/L (ref 0–44)
AST: 23 U/L (ref 15–41)
Albumin: 3.3 g/dL — ABNORMAL LOW (ref 3.5–5.0)
Alkaline Phosphatase: 66 U/L (ref 38–126)
Anion gap: 9 (ref 5–15)
BUN: 38 mg/dL — ABNORMAL HIGH (ref 8–23)
CO2: 24 mmol/L (ref 22–32)
Calcium: 9 mg/dL (ref 8.9–10.3)
Chloride: 104 mmol/L (ref 98–111)
Creatinine, Ser: 1.25 mg/dL — ABNORMAL HIGH (ref 0.44–1.00)
GFR, Estimated: 43 mL/min — ABNORMAL LOW
Glucose, Bld: 117 mg/dL — ABNORMAL HIGH (ref 70–99)
Potassium: 4 mmol/L (ref 3.5–5.1)
Sodium: 137 mmol/L (ref 135–145)
Total Bilirubin: 1.1 mg/dL (ref 0.3–1.2)
Total Protein: 8 g/dL (ref 6.5–8.1)

## 2022-07-09 LAB — TROPONIN I (HIGH SENSITIVITY)
Troponin I (High Sensitivity): 16 ng/L
Troponin I (High Sensitivity): 15 ng/L (ref <18)

## 2022-07-09 LAB — RESP PANEL BY RT-PCR (RSV, FLU A&B, COVID)  RVPGX2
Influenza A by PCR: NEGATIVE
Influenza B by PCR: NEGATIVE
Resp Syncytial Virus by PCR: NEGATIVE
SARS Coronavirus 2 by RT PCR: NEGATIVE

## 2022-07-09 LAB — BRAIN NATRIURETIC PEPTIDE: B Natriuretic Peptide: 1992 pg/mL — ABNORMAL HIGH (ref 0.0–100.0)

## 2022-07-09 MED ORDER — PRAVASTATIN SODIUM 40 MG PO TABS
40.0000 mg | ORAL_TABLET | Freq: Every day | ORAL | Status: DC
  Administered 2022-07-10: 40 mg via ORAL
  Filled 2022-07-09: qty 1

## 2022-07-09 MED ORDER — UMECLIDINIUM BROMIDE 62.5 MCG/ACT IN AEPB
1.0000 | INHALATION_SPRAY | Freq: Every day | RESPIRATORY_TRACT | Status: DC
  Administered 2022-07-10: 1 via RESPIRATORY_TRACT
  Filled 2022-07-09: qty 7

## 2022-07-09 MED ORDER — NITROGLYCERIN 2 % TD OINT
0.5000 [in_us] | TOPICAL_OINTMENT | Freq: Once | TRANSDERMAL | Status: AC
  Administered 2022-07-09: 0.5 [in_us] via TOPICAL
  Filled 2022-07-09: qty 1

## 2022-07-09 MED ORDER — CHLORHEXIDINE GLUCONATE CLOTH 2 % EX PADS
6.0000 | MEDICATED_PAD | Freq: Every day | CUTANEOUS | Status: DC
  Administered 2022-07-10: 6 via TOPICAL

## 2022-07-09 MED ORDER — ONDANSETRON HCL 4 MG PO TABS: 4.0000 mg | ORAL_TABLET | Freq: Four times a day (QID) | ORAL | Status: DC | PRN

## 2022-07-09 MED ORDER — AMIODARONE HCL 200 MG PO TABS
200.0000 mg | ORAL_TABLET | Freq: Two times a day (BID) | ORAL | Status: DC
  Administered 2022-07-09 – 2022-07-10 (×2): 200 mg via ORAL
  Filled 2022-07-09 (×2): qty 1

## 2022-07-09 MED ORDER — FUROSEMIDE 10 MG/ML IJ SOLN
40.0000 mg | Freq: Once | INTRAMUSCULAR | Status: AC
  Administered 2022-07-09: 40 mg via INTRAVENOUS
  Filled 2022-07-09: qty 4

## 2022-07-09 MED ORDER — ACETAMINOPHEN 650 MG RE SUPP: 650.0000 mg | Freq: Four times a day (QID) | RECTAL | Status: DC | PRN

## 2022-07-09 MED ORDER — PANTOPRAZOLE SODIUM 40 MG PO TBEC
40.0000 mg | DELAYED_RELEASE_TABLET | Freq: Every day | ORAL | Status: DC
  Administered 2022-07-10: 40 mg via ORAL
  Filled 2022-07-09: qty 1

## 2022-07-09 MED ORDER — FUROSEMIDE 10 MG/ML IJ SOLN
40.0000 mg | Freq: Two times a day (BID) | INTRAMUSCULAR | Status: DC
  Administered 2022-07-10: 40 mg via INTRAVENOUS
  Filled 2022-07-09: qty 4

## 2022-07-09 MED ORDER — APIXABAN 2.5 MG PO TABS
2.5000 mg | ORAL_TABLET | Freq: Two times a day (BID) | ORAL | Status: DC
  Administered 2022-07-09 – 2022-07-10 (×2): 2.5 mg via ORAL
  Filled 2022-07-09 (×2): qty 1

## 2022-07-09 MED ORDER — POLYETHYLENE GLYCOL 3350 17 G PO PACK: 17.0000 g | PACK | Freq: Every day | ORAL | Status: DC | PRN

## 2022-07-09 MED ORDER — METOPROLOL TARTRATE 50 MG PO TABS
75.0000 mg | ORAL_TABLET | Freq: Two times a day (BID) | ORAL | Status: DC
  Administered 2022-07-09: 75 mg via ORAL
  Filled 2022-07-09: qty 1

## 2022-07-09 MED ORDER — CLOPIDOGREL BISULFATE 75 MG PO TABS
75.0000 mg | ORAL_TABLET | Freq: Every morning | ORAL | Status: DC
  Administered 2022-07-10: 75 mg via ORAL
  Filled 2022-07-09: qty 1

## 2022-07-09 MED ORDER — ONDANSETRON HCL 4 MG/2ML IJ SOLN: 4.0000 mg | Freq: Four times a day (QID) | INTRAMUSCULAR | Status: DC | PRN

## 2022-07-09 MED ORDER — COLLAGENASE 250 UNIT/GM EX OINT
TOPICAL_OINTMENT | Freq: Every day | CUTANEOUS | Status: DC
  Filled 2022-07-09: qty 30

## 2022-07-09 MED ORDER — ACETAMINOPHEN 325 MG PO TABS: 650.0000 mg | ORAL_TABLET | Freq: Four times a day (QID) | ORAL | Status: DC | PRN

## 2022-07-09 NOTE — Telephone Encounter (Signed)
Pt c/o Shortness Of Breath: STAT if SOB developed within the last 24 hours or pt is noticeably SOB on the phone  1. Are you currently SOB (can you hear that pt is SOB on the phone)?  On the phone with husband. SOB occurs when patient lays down or when moving. Patient's husband assumes patient may have excess fluid around her lungs   2. How long have you been experiencing SOB?  Since Saturday 3/30  3. Are you SOB when sitting or when up moving around?  When moving around   4. Are you currently experiencing any other symptoms?  Flutters, elevated BP (170's/80's)

## 2022-07-09 NOTE — ED Notes (Signed)
ED TO INPATIENT HANDOFF REPORT  ED Nurse Name and Phone #: Benjamine Mola [phone]  S Name/Age/Gender [name] 82 y.o. female Room/Bed: APA08/APA08  Code Status   Code Status: Prior  Home/SNF/Other Home Patient oriented to: self, place, time, and situation Is this baseline? Yes   Triage Complete: Triage complete  Chief Complaint Acute on chronic systolic (congestive) heart failure [I50.23]  Triage Note Pt states she gets SOB when laying down. Feels like she is Afib again.    Also BP has been running high.  Stated cardio changed her meds last week.   She is scheduled  for cardioversion on the 26th   Allergies Allergies  Allergen Reactions   Iodine Rash and Other (See Comments)    BETADINE Rash/burning, blisters on skin. Burns skin   Iohexol Rash and Other (See Comments)    Blisters; PT NEEDS 13-HOUR PREP    Sulfa Antibiotics Nausea And Vomiting   Petroleum Gauze Non-Woven 3x9" [Wound Dressings] Other (See Comments)    BURNING SENSATION, RED SKIN   Povidone-Iodine Other (See Comments)    Burns skin   Wound Dressing Adhesive Other (See Comments)    Burns skin   Tape Itching, Rash and Other (See Comments)    "DO NOT USE ADHESIVE TAPE" Not even a Band Aid" NO PAPER TAPE Burns skin Skin is very very thin    Level of Care/Admitting Diagnosis ED Disposition     ED Disposition  Admit   Condition  --   [place]: [facility] [id number]  Level of Care: Telemetry [5]  Covid Evaluation: Asymptomatic - no recent exposure (last 10 days) testing not required  Diagnosis: Acute on chronic systolic (congestive) heart failure [id number]  Admitting Physician: Kara Pacer  Attending Physician: Bethena Roys Nessa.Cuff          B Medical/Surgery History Past Medical History:  Diagnosis Date   Anemia 05/2014.   Atrial fibrillation ([place])    x 3 yrs   Breast cancer ([place])    S/P left mastectomy and chemotherapy 1989 remained in  remission   Carotid artery occlusion    Carotid bruit    LICA [id number] (duplex [id number])   Degenerative arthritis of spine 2015   Chronic back pain   Hyperlipidemia    Hypertension    Meningioma ([place])    Osteoporosis    Pneumonia May 19, 2014   Stroke [facility])    CVA 2008   Subclavian steal syndrome    Ulcers of both lower extremities ([place]) 2015   Past Surgical History:  Procedure Laterality Date   ABDOMINAL AORTOGRAM W/LOWER EXTREMITY N/A 12/19/2021   Procedure: ABDOMINAL AORTOGRAM W/LOWER EXTREMITY;  Surgeon: Serafina Mitchell, MD;  Location: [place] [facility];  Service: Cardiovascular;  Laterality: N/A;   AMPUTATION TOE Left 12/29/2021   Procedure: Incisional DEBRIDEMENT Left Foot with Amputation Left Great Toe;  Surgeon: Felipa Furnace, DPM;  Location: [place];  Service: Podiatry;  Laterality: Left;   CARDIAC CATHETERIZATION     CATARACT EXTRACTION Bilateral 2013   COLONOSCOPY  11/17/2010   Procedure: COLONOSCOPY;  Surgeon: [name], MD;  [facility];  Service: Endoscopy;  Laterality: N/A;  10:45 am   ESOPHAGOGASTRODUODENOSCOPY  11/17/2010   Procedure: ESOPHAGOGASTRODUODENOSCOPY (EGD);  Surgeon: [name], MD;  [facility];  Service: Endoscopy;  Laterality: N/A;   ESOPHAGOGASTRODUODENOSCOPY N/A 02/16/2015   Procedure: ESOPHAGOGASTRODUODENOSCOPY (EGD);  Surgeon: [name], MD;  Location: [place];  Service: Gastroenterology;  Laterality: N/A;   EXPLORATORY LAPAROTOMY  1960s   For peritonitis of undetermined cause   EYE SURGERY     LUMBAR EPIDURAL INJECTION  06-2012--06-2013   pt. states she has had 5 epidurals in 06-2012----06-2013   MASTECTOMY Left 1990   PERIPHERAL VASCULAR INTERVENTION Left 12/19/2021   Procedure: PERIPHERAL VASCULAR INTERVENTION;  Surgeon: Serafina Mitchell, MD;  Location: [place] [facility];  Service: Cardiovascular;  Laterality: Left;  SFA and PTA of POP   YAG LASER APPLICATION Right [id number]   Procedure: YAG  LASER APPLICATION;  Surgeon: [name], MD;  Location: [facility];  Service: Ophthalmology;  Laterality: Right;  right   YAG LASER APPLICATION Left [id number]   Procedure: YAG LASER APPLICATION;  Surgeon: [name], MD;  Location: [facility];  Service: Ophthalmology;  Laterality: Left;     A IV Location/Drains/Wounds Patient Lines/Drains/Airways Status     Active Line/Drains/Airways     Name Placement date Placement time Site Days   Peripheral IV 07/09/22 22 G Anterior;Right Forearm 07/09/22  1500  Forearm  less than 1   External Urinary Catheter 06/21/22  1018  --  18   Wound / Incision (Open or Dehisced) 05/24/22 Other (Comment) Foot Left;Anterior 05/24/22  --  Foot  46   Wound / Incision (Open or Dehisced) 05/24/22 Venous stasis ulcer Foot Anterior;Left;Distal Healed pink area.  No drainage or open skin 05/24/22  1432  Foot  46   Wound / Incision (Open or Dehisced) 06/18/22 Ankle Left;Lateral 06/18/22  1219  Ankle  21            Intake/Output Last 24 hours No intake or output data in the 24 hours ending 07/09/22 1927  Labs/Imaging Results for orders placed or performed during the hospital encounter of 07/09/22 (from the past 48 hour(s))  Resp panel by RT-PCR (RSV, Flu A&B, Covid) Anterior Nasal Swab     Status: None   Collection Time: 07/09/22 12:56 PM   Specimen: Anterior Nasal Swab  Result Value Ref Range   SARS Coronavirus 2 by RT PCR NEGATIVE NEGATIVE    Comment: (NOTE) SARS-CoV-2 target nucleic acids are NOT DETECTED.  The SARS-CoV-2 RNA is generally detectable in upper respiratory specimens during the acute phase of infection. The lowest concentration of SARS-CoV-2 viral copies this assay can detect is 138 copies/mL. A negative result does not preclude SARS-Cov-2 infection and should not be used as the sole basis for treatment or other patient management decisions. A negative result may occur with  improper specimen collection/handling, submission of specimen  other than nasopharyngeal swab, presence of viral mutation(s) within the areas targeted by this assay, and inadequate number of viral copies(<138 copies/mL). A negative result must be combined with clinical observations, patient history, and epidemiological information. The expected result is Negative.  Fact Sheet for Patients:  EntrepreneurPulse.com.au  Fact Sheet for Healthcare Providers:  IncredibleEmployment.be  This test is no t yet approved or cleared by the [place] FDA and  has been authorized for detection and/or diagnosis of SARS-CoV-2 by FDA under an Emergency Use Authorization (EUA). This EUA will remain  in effect (meaning this test can be used) for the duration of the COVID-19 declaration under Section 564(b)(1) of the Act, 21 U.S.C.section 360bbb-3(b)(1), unless the authorization is terminated  or revoked sooner.       Influenza A by PCR NEGATIVE NEGATIVE   Influenza B by PCR NEGATIVE NEGATIVE    Comment: (NOTE) The Xpert Xpress SARS-CoV-2/FLU/RSV plus assay  is intended as an aid in the diagnosis of influenza from Nasopharyngeal swab specimens and should not be used as a sole basis for treatment. Nasal washings and aspirates are unacceptable for Xpert Xpress SARS-CoV-2/FLU/RSV testing.  Fact Sheet for Patients: EntrepreneurPulse.com.au  Fact Sheet for Healthcare Providers: IncredibleEmployment.be  This test is not yet approved or cleared by the [place] FDA and has been authorized for detection and/or diagnosis of SARS-CoV-2 by FDA under an Emergency Use Authorization (EUA). This EUA will remain in effect (meaning this test can be used) for the duration of the COVID-19 declaration under Section 564(b)(1) of the Act, 21 U.S.C. section 360bbb-3(b)(1), unless the authorization is terminated or revoked.     Resp Syncytial Virus by PCR NEGATIVE NEGATIVE    Comment: (NOTE) Fact  Sheet for Patients: EntrepreneurPulse.com.au  Fact Sheet for Healthcare Providers: IncredibleEmployment.be  This test is not yet approved or cleared by the [place] FDA and has been authorized for detection and/or diagnosis of SARS-CoV-2 by FDA under an Emergency Use Authorization (EUA). This EUA will remain in effect (meaning this test can be used) for the duration of the COVID-19 declaration under Section 564(b)(1) of the Act, 21 U.S.C. section 360bbb-3(b)(1), unless the authorization is terminated or revoked.  Performed at [facility], [address] [place], [place] [address]   CBC with Differential     Status: Abnormal   Collection Time: 07/09/22  2:32 PM  Result Value Ref Range   WBC 9.9 4.0 - 10.5 K/uL   RBC 3.87 3.87 - 5.11 MIL/uL   Hemoglobin 12.4 12.0 - 15.0 g/dL   HCT 39.2 36.0 - 46.0 %   MCV 101.3 (H) 80.0 - 100.0 fL   MCH 32.0 26.0 - 34.0 pg   MCHC 31.6 30.0 - 36.0 g/dL   RDW 19.7 (H) 11.5 - 15.5 %   Platelets 268 150 - 400 K/uL   nRBC 0.0 0.0 - 0.2 %   Neutrophils Relative % 78 %   Neutro Abs 7.7 1.7 - 7.7 K/uL   Lymphocytes Relative 9 %   Lymphs Abs 0.9 0.7 - 4.0 K/uL   Monocytes Relative 12 %   Monocytes Absolute 1.2 (H) 0.1 - 1.0 K/uL   Eosinophils Relative 0 %   Eosinophils Absolute 0.0 0.0 - 0.5 K/uL   Basophils Relative 1 %   Basophils Absolute 0.1 0.0 - 0.1 K/uL   Immature Granulocytes 0 %   Abs Immature Granulocytes 0.03 0.00 - 0.07 K/uL    Comment: Performed at [facility], [address], [place], [place] [address]  Brain natriuretic peptide     Status: Abnormal   Collection Time: 07/09/22  2:32 PM  Result Value Ref Range   B Natriuretic Peptide 1,992.0 (H) 0.0 - 100.0 pg/mL    Comment: Performed at [facility], [address], [place], [place] [address]  Comprehensive metabolic panel     Status: Abnormal   Collection Time: 07/09/22  3:26 PM  Result Value Ref Range   Sodium 137 135 - 145 mmol/L    Potassium 4.0 3.5 - 5.1 mmol/L   Chloride 104 98 - 111 mmol/L   CO2 24 22 - 32 mmol/L   Glucose, Bld 117 (H) 70 - 99 mg/dL    Comment: Glucose reference range applies only to samples taken after fasting for at least 8 hours.   BUN 38 (H) 8 - 23 mg/dL   Creatinine, Ser 1.25 (H) 0.44 - 1.00 mg/dL   Calcium 9.0 8.9 - 10.3  mg/dL   Total Protein 8.0 6.5 - 8.1 g/dL   Albumin 3.3 (L) 3.5 - 5.0 g/dL   AST 23 15 - 41 U/L   ALT 16 0 - 44 U/L   Alkaline Phosphatase 66 38 - 126 U/L   Total Bilirubin 1.1 0.3 - 1.2 mg/dL   GFR, Estimated 43 (L) >60 mL/min    Comment: (NOTE) Calculated using the CKD-EPI Creatinine Equation (2021)    Anion gap 9 5 - 15    Comment: Performed at [facility], [address] [place], [place] [address]  Troponin I (High Sensitivity)     Status: None   Collection Time: 07/09/22  5:22 PM  Result Value Ref Range   Troponin I (High Sensitivity) 16 <18 ng/L    Comment: (NOTE) Elevated high sensitivity troponin I (hsTnI) values and significant  changes across serial measurements may suggest ACS but many other  chronic and acute conditions are known to elevate hsTnI results.  Refer to the "Links" section for chest pain algorithms and additional  guidance. Performed at [facility], [address] [place], [place] [address]    DG Chest 2 View  Result Date: 07/09/2022 CLINICAL DATA:  Shortness of breath for 3 weeks EXAM: CHEST - 2 VIEW COMPARISON:  06/22/2022 x-ray and older FINDINGS: Calcified and tortuous aorta. Normal cardiopericardial silhouette. Tiny pleural effusions are seen, left-greater-than-right. The left effusion is slightly increased in the right decreased from the prior x-ray. Persistent interstitial changes bilaterally, slightly decreased from previous previous. No pneumothorax. Surgical clips along the left upper chest. Overlapping cardiac leads. Curvature and degenerative changes of the spine IMPRESSION: Changing pleural effusions, increased left and decreasing  right. These still small. Decreasing interstitial changes. Electronically Signed   By: Jill Side M.D.   On: 07/09/2022 15:02    Pending Labs Unresulted Labs (From admission, onward)    None       Vitals/Pain Today's Vitals   07/09/22 1750 07/09/22 1800 07/09/22 1830 07/09/22 1900  BP:  (!) 163/97 (!) 177/80 (!) 190/85  Pulse:  62 (!) 119 68  Resp:  (!) 24 16 19   Temp: 97.7 F (36.5 C)     TempSrc: Oral     SpO2:  94% 90% 97%  PainSc:        Isolation Precautions No active isolations  Medications Medications  nitroGLYCERIN (NITROGLYN) 2 % ointment 0.5 inch (0.5 inches Topical Given 07/09/22 1623)  furosemide (LASIX) injection 40 mg (40 mg Intravenous Given 07/09/22 1626)    Mobility walks with device     Focused Assessments Cardiac Assessment Handoff:    Lab Results  Component Value Date   CKTOTAL 456 (H) 10/12/2009   CKMB (HH) 10/12/2009    6.6 CRITICAL VALUE NOTED.  VALUE IS CONSISTENT WITH PREVIOUSLY REPORTED AND CALLED VALUE.   TROPONINI <0.03 05/20/2014   Lab Results  Component Value Date   DDIMER (H) 10/10/2009    0.49        AT THE INHOUSE ESTABLISHED CUTOFF VALUE OF 0.48 ug/mL FEU, THIS ASSAY HAS BEEN DOCUMENTED IN THE LITERATURE TO HAVE A SENSITIVITY AND NEGATIVE PREDICTIVE VALUE OF AT LEAST 98 TO 99%.  THE TEST RESULT SHOULD BE CORRELATED WITH AN ASSESSMENT OF THE CLINICAL PROBABILITY OF DVT / VTE.   Does the Patient currently have chest pain? No    R Recommendations: See Admitting Provider Note  Report given to:   Additional Notes: a&o x 4. Uses wc at home as a walker to get around.  Very pleasant.

## 2022-07-09 NOTE — Assessment & Plan Note (Addendum)
EKG showing sinus rhythm.  Follows with cardiologist [name], she is planned for cardioversion 4/26.  She was started on amiodarone 3/27. -Resume amiodarone, metoprolol, Eliquis

## 2022-07-09 NOTE — ED Provider Notes (Signed)
[facility] Provider Note   CSN: [id number] Arrival date & time: 07/09/22  1256     History Chief Complaint  Patient presents with   Hypertension    [name] is a 82 y.o. female. Patient with past history significant for CHF, thrombocytosis, lung nodule, and carotid artery disease presents to the ED for hypertension.  Patient reports been experiencing increasing shortness of breath when she lays down. Recently admitted on 05/24/2022 for acute congestive heart failure and acute respiratory failure. She feels like she may be in A-fib again as she feels some occasional flutters in her chest.  Reports that her cardiologist recently changed her medication last week and restarted her on her amiodarone.  Has cardiac procedure, likely ablation scheduled for April 26. [name] is patient's cardiologist. Patient reports taking furosemide 20mg  daily without issues.    Hypertension Associated symptoms include shortness of breath.       Home Medications Prior to Admission medications   Medication Sig Start Date End Date Taking? Authorizing Provider  amiodarone (PACERONE) 200 MG tablet Take 1 tablet (200 mg total) by mouth 2 (two) times daily. 07/04/22  Yes Josue Hector, MD  ascorbic acid (VITAMIN C) 500 MG tablet Take 500 mg by mouth daily.   Yes [provider]  CALCIUM PO Take 1,200 mg by mouth 2 (two) times daily.   Yes [provider]  Cholecalciferol (VITAMIN D-3 PO) Take 3 tablets by mouth every morning. 3000 units total   Yes [provider]  clopidogrel (PLAVIX) 75 MG tablet TAKE 1 TABLET EVERY MORNING 08/31/21  Yes Josue Hector, MD  denosumab (PROLIA) 60 MG/ML SOSY injection Inject 60 mg into the skin every 6 (six) months.   Yes Celene Squibb, MD  DENTA 5000 PLUS 1.1 % CREA dental cream Place 1 Application onto teeth in the morning. 06/05/22  Yes [provider]  ELIQUIS 2.5 MG TABS tablet TAKE 1  TABLET TWICE A DAY 11/23/21  Yes Josue Hector, MD  Ferrous Sulfate (IRON PO) Take 65 mg by mouth daily at 6 (six) AM.   Yes [provider]  furosemide (LASIX) 20 MG tablet Take 1 tablet (20 mg total) by mouth daily. 06/24/22  Yes Patrecia Pour, MD  MAGNESIUM OXIDE PO Take 500 mg by mouth every morning.   Yes [provider]  metoprolol tartrate (LOPRESSOR) 25 MG tablet TAKE 3 TABLETS TWICE A DAY 10/06/21  Yes Josue Hector, MD  Multiple Vitamin (MULTIVITAMIN WITH MINERALS) TABS tablet Take 1 tablet by mouth every morning.   Yes [provider]  pantoprazole (PROTONIX) 40 MG tablet TAKE 1 TABLET DAILY 08/31/21  Yes Josue Hector, MD  potassium chloride (KLOR-CON M) 10 MEQ tablet Take 1 tablet (10 mEq total) by mouth daily. 05/26/22  Yes Johnson, Clanford L, MD  pravastatin (PRAVACHOL) 40 MG tablet Take 1 tablet (40 mg total) by mouth daily. 07/20/21  Yes Josue Hector, MD  SANTYL 250 UNIT/GM ointment APPLY TO AFFECTED AREAS ONCE DAILY. Patient taking differently: 1 Application daily. 03/30/22  Yes Felipa Furnace, DPM  umeclidinium bromide (INCRUSE ELLIPTA) 62.5 MCG/ACT AEPB Inhale 1 puff into the lungs daily. 06/25/22  Yes Brand Males, MD  acetaminophen (TYLENOL) 325 MG tablet Take 2 tablets (650 mg total) by mouth every 6 (six) hours as needed for mild pain (or Fever >/= 101). 01/02/22   Oswald Hillock, MD  Allergies    Iodine, Iohexol, Sulfa antibiotics, Petroleum gauze non-woven 3x9" [wound dressings], Povidone-iodine, Wound dressing adhesive, and Tape    Review of Systems   Review of Systems  Respiratory:  Positive for shortness of breath.   All other systems reviewed and are negative.   Physical Exam Updated Vital Signs BP (!) 179/94   Pulse 61   Temp 97.7 F (36.5 C) (Oral)   Resp (!) 21   SpO2 96%  Physical Exam Vitals and nursing note reviewed.  Constitutional:      General: She is not in acute distress.    Appearance: She is  well-developed.  HENT:     Head: Normocephalic and atraumatic.  Eyes:     Conjunctiva/sclera: Conjunctivae normal.  Cardiovascular:     Rate and Rhythm: Normal rate and regular rhythm.     Heart sounds: No murmur heard. Pulmonary:     Effort: Pulmonary effort is normal. No respiratory distress.     Breath sounds: Normal breath sounds. No wheezing.     Comments: Crackles noted on auscultation Abdominal:     Palpations: Abdomen is soft.     Tenderness: There is no abdominal tenderness.  Musculoskeletal:        General: No swelling.     Cervical back: Neck supple.     Right lower leg: 2+ Edema present.     Left lower leg: 2+ Edema present.  Skin:    General: Skin is warm and dry.     Capillary Refill: Capillary refill takes less than 2 seconds.  Neurological:     Mental Status: She is alert.  Psychiatric:        Mood and Affect: Mood normal.     ED Results / Procedures / Treatments   Labs (all labs ordered are listed, but only abnormal results are displayed) Labs Reviewed  CBC WITH DIFFERENTIAL/PLATELET - Abnormal; Notable for the following components:      Result Value   MCV 101.3 (*)    RDW 19.7 (*)    Monocytes Absolute 1.2 (*)    All other components within normal limits  BRAIN NATRIURETIC PEPTIDE - Abnormal; Notable for the following components:   B Natriuretic Peptide 1,992.0 (*)    All other components within normal limits  COMPREHENSIVE METABOLIC PANEL - Abnormal; Notable for the following components:   Glucose, Bld 117 (*)    BUN 38 (*)    Creatinine, Ser 1.25 (*)    Albumin 3.3 (*)    GFR, Estimated 43 (*)    All other components within normal limits  RESP PANEL BY RT-PCR (RSV, FLU A&B, COVID)  [id number]  TROPONIN I (HIGH SENSITIVITY)    EKG EKG Interpretation  Date/Time:  Monday July 09 2022 14:23:05 EDT Ventricular Rate:  64 PR Interval:  297 QRS Duration: 87 QT Interval:  503 QTC Calculation: 519 R Axis:   48 Text Interpretation: Sinus or  ectopic atrial rhythm Prolonged PR interval Probable LVH with secondary repol abnrm Anterior Q waves, possibly due to LVH Prolonged QT interval No significant change since last tracing Confirmed by Aletta Edouard [phone]) on 07/09/2022 2:24:29 PM  Radiology DG Chest 2 View  Result Date: 07/09/2022 CLINICAL DATA:  Shortness of breath for 3 weeks EXAM: CHEST - 2 VIEW COMPARISON:  06/22/2022 x-ray and older FINDINGS: Calcified and tortuous aorta. Normal cardiopericardial silhouette. Tiny pleural effusions are seen, left-greater-than-right. The left effusion is slightly increased in the right decreased from the prior x-ray. Persistent interstitial changes bilaterally,  slightly decreased from previous previous. No pneumothorax. Surgical clips along the left upper chest. Overlapping cardiac leads. Curvature and degenerative changes of the spine IMPRESSION: Changing pleural effusions, increased left and decreasing right. These still small. Decreasing interstitial changes. Electronically Signed   By: Jill Side M.D.   On: 07/09/2022 15:02    Procedures Procedures   Medications Ordered in ED Medications  nitroGLYCERIN (NITROGLYN) 2 % ointment 0.5 inch (0.5 inches Topical Given 07/09/22 1623)  furosemide (LASIX) injection 40 mg (40 mg Intravenous Given 07/09/22 1626)    ED Course/ Medical Decision Making/ A&P                           Medical Decision Making Amount and/or Complexity of Data Reviewed Labs: ordered. Radiology: ordered.   This patient presents to the ED for concern of hypertension and shortness of breath. Differential diagnosis includes CHF exacerbation, COVID-19, viral URI, pneumonia, pleural effusion   Lab Tests:  I Ordered, and personally interpreted labs.  The pertinent results include: Some renal impairment elevated BUN, elevated creatinine at 1.25, decreased GFR 43.  CBC largely normal.  BNP significantly elevated close to 2000.   Imaging Studies ordered:  I ordered imaging  studies including chest xray  I independently visualized and interpreted imaging which showed some changes and pleural effusions noted in prior chest x-rays but no significant abnormality I agree with the radiologist interpretation   Medicines ordered and prescription drug management:  I ordered medication including Lasix, nitrolgycerin for fluid exacerbation  Reevaluation of the patient after these medicines showed that the patient improved I have reviewed the patients home medicines and have made adjustments as needed   Problem List / ED Course:  Patient presents to the ED for shortness of breath. Patient reports that she began experiencing shortness of breath come on on Saturday and feels that SOB typically worsens when lying down. She was initially concerned that this was due to afib as cardiology recently changed some medications for her but her EKG was normal today. This appears to be more fluid overload related given patient has some swelling noted in her lower extremities and crackles heard on auscultation. BNP significantly elevated close to 2000 and will likely warrant admission for diuresis. Will consult to hospitalist for admission.  Spoke with Dr. Denton Brick who is agreeable to admission for patient given acute CHF exacerbation with borderline hypoxia. Informed patient that she will be admitted for monitoring and continued treatment and she is agreeable with this plan. In the meantime, patient given dose of Lasix and nitroglycerin for CHF exacerbation. I would suspect that patient likely needs a higher dose of Lasix at home given that she has now had a repeat episode of CHF exacerbation with the 20mg  dose she currently takes.   Final Clinical Impression(s) / ED Diagnoses Final diagnoses:  Acute congestive heart failure, unspecified heart failure type    Rx / DC Orders ED Discharge Orders     None         Luvenia Heller, PA-C 07/09/22 1759    Hayden Rasmussen,  MD 07/09/22 [phone]

## 2022-07-09 NOTE — Assessment & Plan Note (Signed)
Wounds to left ankle around lateral malleoli.  1 wound with minimal purulent drainage but no surrounding signs of infection, second wound appears to be healing.  Follows with wound care as outpatient. -Care consult

## 2022-07-09 NOTE — Assessment & Plan Note (Signed)
Presenting with orthopnea and dyspnea on exertion, has bilateral lower extremity edema.  BNP elevated at 1992 (baseline appears to be 500s to 800s), chest x-ray-pleural effusion increased left and decreased on right, noted as small.  Borderline hypoxia O2 sats 90% on room air, on 2 L. Reports compliance with Lasix 20 mg daily and Eliquis.  Recent echo 05/2022, EF of 40 to 45%. -IV Lasix 40 twice daily -Strict input output, daily weights, daily BMP

## 2022-07-09 NOTE — Telephone Encounter (Signed)
I spoke with patient's husband.  He reports patient's BP today is 170's/80's. Was not checking prior to today.  Having increased fluttering feeling. Husband reports patient "started feeling bad" on Saturday.  Was short of breath with any activity.  Felt worse on Sunday.  Today husband feels patient is worse than yesterday.  Unable to lie flat.  Short of breath with any exertion. Does not weigh daily.  Is taking furosemide as ordered.  No swelling but does have wound on lower leg that is being treated.  Due to increasing shortness of breath I advised husband that patient needs to be evaluated in ED.  Recommended they call 911 but husband feels he can transport her.  Husband will make patient aware of recommendations but he reports patient does not want to go to hospital.

## 2022-07-09 NOTE — H&P (Signed)
History and Physical    [name] [id number] DOB: 02/22/1941 DOA: 07/09/2022  PCP: Celene Squibb, MD   Patient coming from: Home  I have personally briefly reviewed patient's old medical records in [facility]  Chief Complaint: Difficulty breathing  HPI: [name] is a 82 y.o. female with medical history significant for COPD, CHF, hypertension, atrial fibrillation on chronic anticoagulation. Patient presented to the ED with complaints of difficulty breathing.  Patient has chronic difficulty breathing, but this worsened 2 days ago.  Difficulty breathing is worse when she is lying flat or with exertion.  She noted mild swelling to her lower extremities today.  No abdominal bloating.  She does not check her weight regularly.  She has been compliant with her Lasix 20 mg daily and Eliquis.  No chest pain.  Recent hospitalization 3/11 to 3/17 for acute respiratory failure secondary to pneumonia and pulmonary edema.  Treated with IV Lasix and antibiotics.  ED Course: Temperature 97.9.  Heart rate 50s to 119.  Respiratory rate 16-24.  Blood pressure systolic [id number] to [id number].  O2 sats 90% on room air, she was placed on 2 L. BNP elevated at 1992.  EKG shows sinus rhythm. Troponin 16. IV Lasix 2 mg x 1 given.  Hospitalist to admit for acute on chronic CHF, with borderline hypoxia.  Review of Systems: As per HPI all other systems reviewed and negative.  Past Medical History:  Diagnosis Date   Anemia 05/2014.   Atrial fibrillation ([place])    x 3 yrs   Breast cancer ([place])    S/P left mastectomy and chemotherapy 1989 remained in remission   Carotid artery occlusion    Carotid bruit    LICA [id number] (duplex [id number])   Degenerative arthritis of spine 2015   Chronic back pain   Hyperlipidemia    Hypertension    Meningioma ([place])    Osteoporosis    Pneumonia May 19, 2014   Stroke [facility])    CVA 2008   Subclavian steal syndrome    Ulcers of both lower extremities ([place]) 2015    Past  Surgical History:  Procedure Laterality Date   ABDOMINAL AORTOGRAM W/LOWER EXTREMITY N/A 12/19/2021   Procedure: ABDOMINAL AORTOGRAM W/LOWER EXTREMITY;  Surgeon: Serafina Mitchell, MD;  Location: [facility];  Service: Cardiovascular;  Laterality: N/A;   AMPUTATION TOE Left 12/29/2021   Procedure: Incisional DEBRIDEMENT Left Foot with Amputation Left Great Toe;  Surgeon: Felipa Furnace, DPM;  Location: [facility];  Service: Podiatry;  Laterality: Left;   CARDIAC CATHETERIZATION     CATARACT EXTRACTION Bilateral 2013   COLONOSCOPY  11/17/2010   Procedure: COLONOSCOPY;  Surgeon: [name], MD;  [facility];  Service: Endoscopy;  Laterality: N/A;  10:45 am   ESOPHAGOGASTRODUODENOSCOPY  11/17/2010   Procedure: ESOPHAGOGASTRODUODENOSCOPY (EGD);  Surgeon: [name], MD;  [facility];  Service: Endoscopy;  Laterality: N/A;   ESOPHAGOGASTRODUODENOSCOPY N/A 02/16/2015   Procedure: ESOPHAGOGASTRODUODENOSCOPY (EGD);  Surgeon: [name], MD;  Location: [facility];  Service: Gastroenterology;  Laterality: N/A;   EXPLORATORY LAPAROTOMY  1960s   For peritonitis of undetermined cause   EYE SURGERY     LUMBAR EPIDURAL INJECTION  06-2012--06-2013   pt. states she has had 5 epidurals in 06-2012----06-2013   MASTECTOMY Left 1990   PERIPHERAL VASCULAR INTERVENTION Left 12/19/2021   Procedure: PERIPHERAL VASCULAR INTERVENTION;  Surgeon: Serafina Mitchell, MD;  Location: [facility];  Service: Cardiovascular;  Laterality: Left;  SFA and PTA of POP   YAG LASER APPLICATION Right [id number]   Procedure: YAG LASER APPLICATION;  Surgeon: [name], MD;  Location: [facility];  Service: Ophthalmology;  Laterality: Right;  right   YAG LASER APPLICATION Left [id number]   Procedure: YAG LASER APPLICATION;  Surgeon: [name], MD;  Location: [facility];  Service: Ophthalmology;  Laterality: Left;     reports that she quit smoking about 15 years ago. Her smoking use included  cigarettes. She has a 56.00 pack-year smoking history. She has never been exposed to tobacco smoke. She has never used smokeless tobacco. She reports that she does not drink alcohol and does not use drugs.  Allergies  Allergen Reactions   Iodine Rash and Other (See Comments)    BETADINE Rash/burning, blisters on skin. Burns skin   Iohexol Rash and Other (See Comments)    Blisters; PT NEEDS 13-HOUR PREP    Sulfa Antibiotics Nausea And Vomiting   Petroleum Gauze Non-Woven 3x9" [Wound Dressings] Other (See Comments)    BURNING SENSATION, RED SKIN   Povidone-Iodine Other (See Comments)    Burns skin   Wound Dressing Adhesive Other (See Comments)    Burns skin   Tape Itching, Rash and Other (See Comments)    "DO NOT USE ADHESIVE TAPE" Not even a Band Aid" NO PAPER TAPE Burns skin Skin is very very thin    Family History  Problem Relation Age of Onset   Alzheimer's disease Mother    Dementia Mother    Hypertension Mother    Hyperlipidemia Mother    Parkinsonism Father     Prior to Admission medications   Medication Sig Start Date End Date Taking? Authorizing Provider  acetaminophen (TYLENOL) 325 MG tablet Take 2 tablets (650 mg total) by mouth every 6 (six) hours as needed for mild pain (or Fever >/= 101). 01/02/22   Oswald Hillock, MD  amiodarone (PACERONE) 200 MG tablet Take 1 tablet (200 mg total) by mouth 2 (two) times daily. 07/04/22   Josue Hector, MD  ascorbic acid (VITAMIN C) 500 MG tablet Take 500 mg by mouth daily.    [provider]  CALCIUM PO Take 1,200 mg by mouth daily.    [provider]  Cholecalciferol (VITAMIN D-3 PO) Take 3 tablets by mouth every morning. 3000 units total    [provider]  clopidogrel (PLAVIX) 75 MG tablet TAKE 1 TABLET EVERY MORNING 08/31/21   Josue Hector, MD  denosumab (PROLIA) 60 MG/ML SOSY injection Inject 60 mg into the skin every 6 (six) months.    Celene Squibb, MD  DENTA 5000 PLUS 1.1 % CREA dental cream  SMARTSIG:Sparingly By Mouth Daily 06/05/22   [provider]  ELIQUIS 2.5 MG TABS tablet TAKE 1 TABLET TWICE A DAY 11/23/21   Josue Hector, MD  Ferrous Sulfate (IRON PO) Take 65 mg by mouth daily at 6 (six) AM.    [provider]  furosemide (LASIX) 20 MG tablet Take 1 tablet (20 mg total) by mouth daily. 06/24/22   Patrecia Pour, MD  MAGNESIUM OXIDE PO Take 500 mg by mouth every morning.    [provider]  metoprolol tartrate (LOPRESSOR) 25 MG tablet TAKE 3 TABLETS TWICE A DAY 10/06/21   Josue Hector, MD  Multiple Vitamin (MULTIVITAMIN WITH MINERALS) TABS tablet Take 1 tablet by mouth every morning.    [provider]  pantoprazole (PROTONIX) 40 MG tablet TAKE 1  TABLET DAILY 08/31/21   Josue Hector, MD  potassium chloride (KLOR-CON M) 10 MEQ tablet Take 1 tablet (10 mEq total) by mouth daily. 05/26/22   Johnson, Clanford L, MD  pravastatin (PRAVACHOL) 40 MG tablet Take 1 tablet (40 mg total) by mouth daily. 07/20/21   Josue Hector, MD  SANTYL 250 UNIT/GM ointment APPLY TO AFFECTED AREAS ONCE DAILY. 03/30/22   Felipa Furnace, DPM  umeclidinium bromide (INCRUSE ELLIPTA) 62.5 MCG/ACT AEPB Inhale 1 puff into the lungs daily. 06/25/22   Brand Males, MD    Physical Exam: Vitals:   07/09/22 1500 07/09/22 1600 07/09/22 1630 07/09/22 1700  BP: (!) 184/77 (!) 185/81 (!) 176/95 (!) 179/94  Pulse: (!) 59 60 64 61  Resp: (!) 24 18 16  (!) 21  Temp:      TempSrc:      SpO2: 97% 100% 96% 96%    Constitutional:  thin, , calm, comfortable Vitals:   07/09/22 1500 07/09/22 1600 07/09/22 1630 07/09/22 1700  BP: (!) 184/77 (!) 185/81 (!) 176/95 (!) 179/94  Pulse: (!) 59 60 64 61  Resp: (!) 24 18 16  (!) 21  Temp:      TempSrc:      SpO2: 97% 100% 96% 96%   Eyes: PERRL, lids and conjunctivae normal ENMT: Mucous membranes are moist.   Neck: normal, supple, no masses, no thyromegaly Respiratory: Crackles bilateral lung bases, no wheezing,  Normal  respiratory effort. No accessory muscle use.  Cardiovascular: Regular rate and rhythm, no murmurs / rubs / gallops.  Trace pitting bilateral lower extremity edema.  Abdomen: no tenderness, no masses palpated. No hepatosplenomegaly. Bowel sounds positive.  Musculoskeletal: no clubbing / cyanosis. No joint deformity upper and lower extremities.  Skin: 2 Wounds to left lower extremity just superior to lateral malleoli, first wound which is small superior has minimal purulent drainage, but 3 cm longitudinally across, without surrounding signs of infection, second wound which is inferior to the first, appears to be healing, about 1 cm across longitudinally, without drainage.   Neurologic: Facial asymmetry, speech fluent without aphasia, moving all extremities spontaneously.   Psychiatric: Normal judgment and insight. Alert and oriented x 3. Normal mood.   Labs on Admission: I have personally reviewed following labs and imaging studies  CBC: Recent Labs  Lab 07/09/22 1432  WBC 9.9  NEUTROABS 7.7  HGB 12.4  HCT 39.2  MCV 101.3*  PLT [id number]   Basic Metabolic Panel: Recent Labs  Lab 07/09/22 1526  NA 137  K 4.0  CL 104  CO2 24  GLUCOSE 117*  BUN 38*  CREATININE 1.25*  CALCIUM 9.0   GFR: Estimated Creatinine Clearance: 23 mL/min (A) (by C-G formula based on SCr of 1.25 mg/dL (H)). Liver Function Tests: Recent Labs  Lab 07/09/22 1526  AST 23  ALT 16  ALKPHOS 66  BILITOT 1.1  PROT 8.0  ALBUMIN 3.3*    Radiological Exams on Admission: DG Chest 2 View  Result Date: 07/09/2022 CLINICAL DATA:  Shortness of breath for 3 weeks EXAM: CHEST - 2 VIEW COMPARISON:  06/22/2022 x-ray and older FINDINGS: Calcified and tortuous aorta. Normal cardiopericardial silhouette. Tiny pleural effusions are seen, left-greater-than-right. The left effusion is slightly increased in the right decreased from the prior x-ray. Persistent interstitial changes bilaterally, slightly decreased from previous  previous. No pneumothorax. Surgical clips along the left upper chest. Overlapping cardiac leads. Curvature and degenerative changes of the spine IMPRESSION: Changing pleural effusions, increased left and decreasing right. These  still small. Decreasing interstitial changes. Electronically Signed   By: Jill Side M.D.   On: 07/09/2022 15:02    EKG: Independently reviewed.  Sinus rhythm, rate 64, QTc 519.  No significant ST or T wave changes from prior.  Assessment/Plan Principal Problem:   Acute on chronic HFrEF (heart failure with reduced ejection fraction) Active Problems:   Persistent atrial fibrillation   GI bleed   COPD, moderate   Wound of ankle   Long term current use of anticoagulant    Assessment and Plan: * Acute on chronic HFrEF (heart failure with reduced ejection fraction) Presenting with orthopnea and dyspnea on exertion, has bilateral lower extremity edema.  BNP elevated at 1992 (baseline appears to be 500s to 800s), chest x-ray-pleural effusion increased left and decreased on right, noted as small.  Borderline hypoxia O2 sats 90% on room air, on 2 L. Reports compliance with Lasix 20 mg daily and Eliquis.  Recent echo 05/2022, EF of 40 to 45%. -IV Lasix 40 twice daily -Strict input output, daily weights, daily BMP  Persistent atrial fibrillation EKG showing sinus rhythm.  Follows with cardiologist [name], she is planned for cardioversion 4/26.  She was started on amiodarone 3/27. -Resume amiodarone, metoprolol, Eliquis  COPD, moderate Stable. - resume home regimen  GI bleed History of GI bleed.  Hemoglobin stable at 12.4.  She is on Eliquis and Plavix.  Wound of ankle Wounds to left ankle around lateral malleoli.  1 wound with minimal purulent drainage but no surrounding signs of infection, second wound appears to be healing.  Follows with wound care as outpatient. -Care consult   DVT prophylaxis: Eliquis Code Status: DNR, confirmed with patient and spouse at  bedside. Family Communication: Spouse at bedside Disposition Plan: ~ 2 days Consults called: NOne Admission status:  Obs tele    Author: Bethena Roys, MD 07/09/2022 8:37 PM  For on call review [email or web address].

## 2022-07-09 NOTE — Assessment & Plan Note (Signed)
History of GI bleed.  Hemoglobin stable at 12.4.  She is on Eliquis and Plavix.

## 2022-07-09 NOTE — Assessment & Plan Note (Signed)
Stable. - resume home regimen

## 2022-07-09 NOTE — Consult Note (Signed)
[facility] Nurse Consult Note: Reason for Consult:Left lateral malleolus chronic, nonhealing wound. Patient is followed in the community by Podiatry, Dr. Posey Pronto. Last seen in his office on 06/14/22 for this wound. I will continue the POC provided by Dr. Posey Pronto. Wound type:full thickness Pressure Injury POA: N/A Measurement:Bedside RN to measure and document measurements on Nursing flow sheet with next dressing change Wound bed:\yellow, nonviable fibrinous tissue obscures wound bed Drainage (amount, consistency, odor) small yellow Periwound:intact Dressing procedure/placement/frequency: I will continue the POC for once daily collagenase (Santyl) ointment applied after cleansing. This is to be topped with a saline dampened gauze and followed by a dry gauze, secured with a few turns of Kerlix roll gauze and paper tape. No tape is to be applied to the skin. While in bed, pressure redistribution heel boots are to be used. A sacral foam is to be placed for pI prevention to the sacrum.  Patient should follow up with Dr. Posey Pronto at her regularly scheduled appointment for continuing oversight of this wound.  [facility] nursing team will not follow, but will remain available to this patient, the nursing and medical teams.  Please re-consult if needed.  Thank you for inviting [place] to participate in this patient's Plan of Care.  Maudie Flakes, MSN, RN, CNS, [place], Serita Grammes, [employer], [employer] phone:  [phone]

## 2022-07-09 NOTE — ED Triage Notes (Signed)
Pt states she gets SOB when laying down. Feels like she is Afib again.    Also BP has been running high.  Stated cardio changed her meds last week.   She is scheduled  for cardioversion on the 26th

## 2022-07-10 ENCOUNTER — Encounter (HOSPITAL_COMMUNITY): Payer: Self-pay | Admitting: Internal Medicine

## 2022-07-10 DIAGNOSIS — I5023 Acute on chronic systolic (congestive) heart failure: Secondary | ICD-10-CM | POA: Diagnosis not present

## 2022-07-10 DIAGNOSIS — I4819 Other persistent atrial fibrillation: Secondary | ICD-10-CM | POA: Diagnosis not present

## 2022-07-10 DIAGNOSIS — Z89412 Acquired absence of left great toe: Secondary | ICD-10-CM | POA: Diagnosis not present

## 2022-07-10 DIAGNOSIS — Z8673 Personal history of transient ischemic attack (TIA), and cerebral infarction without residual deficits: Secondary | ICD-10-CM | POA: Diagnosis not present

## 2022-07-10 DIAGNOSIS — Z853 Personal history of malignant neoplasm of breast: Secondary | ICD-10-CM | POA: Diagnosis not present

## 2022-07-10 DIAGNOSIS — Z7902 Long term (current) use of antithrombotics/antiplatelets: Secondary | ICD-10-CM | POA: Diagnosis not present

## 2022-07-10 DIAGNOSIS — Z87891 Personal history of nicotine dependence: Secondary | ICD-10-CM | POA: Diagnosis not present

## 2022-07-10 DIAGNOSIS — K922 Gastrointestinal hemorrhage, unspecified: Secondary | ICD-10-CM | POA: Diagnosis not present

## 2022-07-10 DIAGNOSIS — I110 Hypertensive heart disease with heart failure: Secondary | ICD-10-CM | POA: Diagnosis not present

## 2022-07-10 DIAGNOSIS — Z79899 Other long term (current) drug therapy: Secondary | ICD-10-CM | POA: Diagnosis not present

## 2022-07-10 DIAGNOSIS — R0602 Shortness of breath: Secondary | ICD-10-CM | POA: Diagnosis present

## 2022-07-10 DIAGNOSIS — Z7901 Long term (current) use of anticoagulants: Secondary | ICD-10-CM | POA: Diagnosis not present

## 2022-07-10 DIAGNOSIS — J449 Chronic obstructive pulmonary disease, unspecified: Secondary | ICD-10-CM | POA: Diagnosis not present

## 2022-07-10 DIAGNOSIS — Z1152 Encounter for screening for COVID-19: Secondary | ICD-10-CM | POA: Diagnosis not present

## 2022-07-10 LAB — BASIC METABOLIC PANEL
Anion gap: 8 (ref 5–15)
BUN: 34 mg/dL — ABNORMAL HIGH (ref 8–23)
CO2: 26 mmol/L (ref 22–32)
Calcium: 8.6 mg/dL — ABNORMAL LOW (ref 8.9–10.3)
Chloride: 103 mmol/L (ref 98–111)
Creatinine, Ser: 1.27 mg/dL — ABNORMAL HIGH (ref 0.44–1.00)
GFR, Estimated: 42 mL/min — ABNORMAL LOW (ref >60)
Glucose, Bld: 120 mg/dL — ABNORMAL HIGH (ref 70–99)
Potassium: 3.3 mmol/L — ABNORMAL LOW (ref 3.5–5.1)
Sodium: 137 mmol/L (ref 135–145)

## 2022-07-10 MED ORDER — POTASSIUM CHLORIDE CRYS ER 20 MEQ PO TBCR
40.0000 meq | EXTENDED_RELEASE_TABLET | Freq: Once | ORAL | Status: AC
  Administered 2022-07-10: 40 meq via ORAL
  Filled 2022-07-10: qty 2

## 2022-07-10 MED ORDER — LIVING BETTER WITH HEART FAILURE BOOK: Freq: Once | Status: DC

## 2022-07-10 MED ORDER — METOPROLOL TARTRATE 50 MG PO TABS
50.0000 mg | ORAL_TABLET | Freq: Two times a day (BID) | ORAL | Status: DC
  Administered 2022-07-10: 50 mg via ORAL
  Filled 2022-07-10: qty 1

## 2022-07-10 MED ORDER — FUROSEMIDE 20 MG PO TABS: 20.0000 mg | ORAL_TABLET | Freq: Two times a day (BID) | ORAL | 0 refills | Status: DC

## 2022-07-10 NOTE — TOC Transition Note (Signed)
Transition of Care [facility]) - CM/SW Discharge Note   Patient Details  Name: [name] MRN: [id number] Date of Birth: 05/01/1940  [facility] CM/SW Contact:  Boneta Lucks, RN Phone Number: 07/10/2022, 12:00 PM   Clinical Narrative:   Patient is discharging home. She is active with [facility] home RN for wound care. TOC updated [name] with discharge plan and asked if RN will do CHF education. Husband is agreeable. Living better with CHF book ordered. RN will deliver.   Discharge Placement     Discharge Plan and Services Additional resources added to the After Visit Summary for                  Social Determinants of Health (SDOH) Interventions SDOH Screenings   Food Insecurity: No Food Insecurity (07/10/2022)  Housing: Low Risk  (07/10/2022)  Transportation Needs: No Transportation Needs (07/10/2022)  Utilities: Not At Risk (07/10/2022)  Tobacco Use: Medium Risk (07/10/2022)     Readmission Risk Interventions    06/18/2022    1:55 PM 03/07/2022    8:09 AM  Readmission Risk Prevention Plan  Transportation Screening Complete Complete  HRI or Home Care Consult Complete Complete  Social Work Consult for [place] Planning/Counseling Complete Complete  Palliative Care Screening Not Applicable Not Applicable  Medication Review [employer]) Complete Complete

## 2022-07-10 NOTE — Progress Notes (Signed)
SATURATION QUALIFICATIONS: (This note is used to comply with regulatory documentation for home oxygen)  Patient Saturations on Room Air at Rest = 94%  Patient Saturations on Room Air while Ambulating = 88%  Patient Saturations on 0 Liters of oxygen while Ambulating = 88%  Please briefly explain why patient needs home oxygen: 

## 2022-07-10 NOTE — Discharge Summary (Signed)
Physician Discharge Summary  [name] [id number] DOB: [birth year] DOA: 07/09/2022  PCP: Celene Squibb, MD  Admit date: 07/09/2022 Discharge date: 07/10/2022 30 Day Unplanned Readmission Risk Score    Flowsheet Row ED to Hosp-Admission (Discharged) from 06/18/2022 in [facility]  30 Day Unplanned Readmission Risk Score (%) 32.2 Filed at 06/24/2022 1200       This score is the patient's risk of an unplanned readmission within 30 days of being discharged (0 -100%). The score is based on dignosis, age, lab data, medications, orders, and past utilization.   Low:  0-14.9   Medium: 15-21.9   High: 22-29.9   Extreme: 30 and above          Admitted From: Home Disposition: Home  Recommendations for Outpatient Follow-up:  Follow up with PCP in 1-2 weeks Please obtain BMP/CBC in one week Follow-up with cardiology in 1 to 2 weeks Please follow up with your PCP on the following pending results: Unresulted Labs (From admission, onward)     Start     Ordered   07/10/22 [id number]  Basic metabolic panel  Daily,   R      07/09/22 2136              Home Health: None Equipment/Devices: None  Discharge Condition: Stable CODE STATUS: DNR Diet recommendation: Cardiac low-sodium  Following HPI and ED course is copied from admitting hospitalist H&P. HPI: [name] is a 82 y.o. female with medical history significant for COPD, CHF, hypertension, atrial fibrillation on chronic anticoagulation. Patient presented to the ED with complaints of difficulty breathing.  Patient has chronic difficulty breathing, but this worsened 2 days ago.  Difficulty breathing is worse when she is lying flat or with exertion.  She noted mild swelling to her lower extremities today.  No abdominal bloating.  She does not check her weight regularly.  She has been compliant with her Lasix 20 mg daily and Eliquis.  No chest pain.   Recent hospitalization 3/11 to 3/17 for acute respiratory failure  secondary to pneumonia and pulmonary edema.  Treated with IV Lasix and antibiotics.   ED Course: Temperature 97.9.  Heart rate 50s to 119.  Respiratory rate 16-24.  Blood pressure systolic [id number] to [id number].  O2 sats 90% on room air, she was placed on 2 L. BNP elevated at 1992.  EKG shows sinus rhythm. Troponin 16. IV Lasix 2 mg x 1 given.  Hospitalist to admit for acute on chronic CHF, with borderline hypoxia.  Subjective: Seen and examined.  Feels better.  No shortness of breath or any other complaint.  Feels comfortable going home.  Brief/Interim Summary: Patient was admitted with acute on chronic heart failure with reduced ejection fraction.  She was started on IV Lasix.  Interestingly, chest x-ray did not show overly pulmonary edema or CHF findings.  Patient was not hypoxic either.  Patient's proBNP was also not much elevated than her baseline.  She had probably very mild edema but I did not appreciate any edema this morning when I saw her either.  Patient did walk in the hall without requiring any clinical oxygen and saturated over 88% during the whole time with walking.  Did not feel shortness of breath.  Patient tells me that she has been in and out of the hospital couple of times lately and she does not think that 20 mg of p.o. Lasix is working for her and she is requesting to increase to 40 mg.  I think that is very reasonable request, I have increased her dosage to 20 mg p.o. twice daily and have advised her to see her cardiologist in 1 to 2 weeks to further adjust medications.  She is being discharged in stable condition.  She was also seen by wound care nurse for her wound to the left ankle.  She follows with Dr. Minette Brine care physician as outpatient, she will continue to follow with them.  Her persistent atrial fibrillation remained rate controlled.  She has follow-up appointment with her cardiologist [name] on 08/03/2022 for cardioversion.  Resume amiodarone, metoprolol and  Eliquis.  Discharge plan was discussed with patient and/or family member and they verbalized understanding and agreed with it.  Discharge Diagnoses:  Principal Problem:   Acute on chronic HFrEF (heart failure with reduced ejection fraction) Active Problems:   Persistent atrial fibrillation   GI bleed   COPD, moderate   Wound of ankle   Long term current use of anticoagulant    Discharge Instructions   Allergies as of 07/10/2022       Reactions   Iodine Rash, Other (See Comments)   BETADINE Rash/burning, blisters on skin. Burns skin   Iohexol Rash, Other (See Comments)   Blisters; PT NEEDS 13-HOUR PREP   Sulfa Antibiotics Nausea And Vomiting   Petroleum Gauze Non-woven 3x9" [wound Dressings] Other (See Comments)   BURNING SENSATION, RED SKIN   Povidone-iodine Other (See Comments)   Burns skin   Wound Dressing Adhesive Other (See Comments)   Burns skin   Tape Itching, Rash, Other (See Comments)   "DO NOT USE ADHESIVE TAPE" Not even a Band Aid" NO PAPER TAPE Burns skin Skin is very very thin        Medication List     TAKE these medications    acetaminophen 325 MG tablet Commonly known as: TYLENOL Take 2 tablets (650 mg total) by mouth every 6 (six) hours as needed for mild pain (or Fever >/= 101).   amiodarone 200 MG tablet Commonly known as: PACERONE Take 1 tablet (200 mg total) by mouth 2 (two) times daily.   ascorbic acid 500 MG tablet Commonly known as: VITAMIN C Take 500 mg by mouth daily.   CALCIUM PO Take 1,200 mg by mouth 2 (two) times daily.   clopidogrel 75 MG tablet Commonly known as: PLAVIX TAKE 1 TABLET EVERY MORNING   denosumab 60 MG/ML Sosy injection Commonly known as: PROLIA Inject 60 mg into the skin every 6 (six) months.   Denta 5000 Plus 1.1 % Crea dental cream Generic drug: sodium fluoride Place 1 Application onto teeth in the morning.   Eliquis 2.5 MG Tabs tablet Generic drug: apixaban TAKE 1 TABLET TWICE A DAY    furosemide 20 MG tablet Commonly known as: LASIX Take 1 tablet (20 mg total) by mouth 2 (two) times daily. What changed: when to take this   Incruse Ellipta 62.5 MCG/ACT Aepb Generic drug: umeclidinium bromide Inhale 1 puff into the lungs daily.   IRON PO Take 65 mg by mouth daily at 6 (six) AM.   MAGNESIUM OXIDE PO Take 500 mg by mouth every morning.   metoprolol tartrate 25 MG tablet Commonly known as: LOPRESSOR TAKE 3 TABLETS TWICE A DAY   multivitamin with minerals Tabs tablet Take 1 tablet by mouth every morning.   pantoprazole 40 MG tablet Commonly known as: PROTONIX TAKE 1 TABLET DAILY   potassium chloride 10 MEQ tablet Commonly known as: KLOR-CON M Take 1  tablet (10 mEq total) by mouth daily.   pravastatin 40 MG tablet Commonly known as: PRAVACHOL Take 1 tablet (40 mg total) by mouth daily.   Santyl 250 UNIT/GM ointment Generic drug: collagenase APPLY TO AFFECTED AREAS ONCE DAILY. What changed: See the new instructions.   VITAMIN D-3 PO Take 3 tablets by mouth every morning. 3000 units total        Follow-up Information     Celene Squibb, MD Follow up in 1 week(s).   Specialty: Internal Medicine Contact information: [facility] [address] [phone]         Josue Hector, MD Follow up in 1 week(s).   Specialty: Cardiology Contact information: [place] [address] [phone]                Allergies  Allergen Reactions   Iodine Rash and Other (See Comments)    BETADINE Rash/burning, blisters on skin. Burns skin   Iohexol Rash and Other (See Comments)    Blisters; PT NEEDS 13-HOUR PREP    Sulfa Antibiotics Nausea And Vomiting   Petroleum Gauze Non-Woven 3x9" [Wound Dressings] Other (See Comments)    BURNING SENSATION, RED SKIN   Povidone-Iodine Other (See Comments)    Burns skin   Wound Dressing Adhesive Other (See Comments)    Burns skin   Tape Itching, Rash and Other (See Comments)     "DO NOT USE ADHESIVE TAPE" Not even a Band Aid" NO PAPER TAPE Burns skin Skin is very very thin    Consultations: None   Procedures/Studies: DG Chest 2 View  Result Date: 07/09/2022 CLINICAL DATA:  Shortness of breath for 3 weeks EXAM: CHEST - 2 VIEW COMPARISON:  06/22/2022 x-ray and older FINDINGS: Calcified and tortuous aorta. Normal cardiopericardial silhouette. Tiny pleural effusions are seen, left-greater-than-right. The left effusion is slightly increased in the right decreased from the prior x-ray. Persistent interstitial changes bilaterally, slightly decreased from previous previous. No pneumothorax. Surgical clips along the left upper chest. Overlapping cardiac leads. Curvature and degenerative changes of the spine IMPRESSION: Changing pleural effusions, increased left and decreasing right. These still small. Decreasing interstitial changes. Electronically Signed   By: Jill Side M.D.   On: 07/09/2022 15:02   DG CHEST PORT 1 VIEW  Result Date: 06/22/2022 CLINICAL DATA:  Dyspnea EXAM: PORTABLE CHEST 1 VIEW COMPARISON:  Radiograph and CT from 4 days ago FINDINGS: Diffuse interstitial opacity with Kerley lines and small bilateral pleural effusion. Bilateral fissure thickening. Cardiomegaly and aortic tortuosity. Pulmonary opacification and pleural fluid has progressed. No pneumothorax. Postoperative left breast and axilla. IMPRESSION: Worsening pulmonary edema and pleural effusion. Electronically Signed   By: Jorje Guild M.D.   On: 06/22/2022 04:09   CT CHEST ABDOMEN PELVIS WO CONTRAST  Result Date: 06/18/2022 CLINICAL DATA:  Shortness of breath and nausea. EXAM: CT CHEST, ABDOMEN AND PELVIS WITHOUT CONTRAST TECHNIQUE: Multidetector CT imaging of the chest, abdomen and pelvis was performed following the standard protocol without IV contrast. RADIATION DOSE REDUCTION: This exam was performed according to the departmental dose-optimization program which includes automated exposure  control, adjustment of the mA and/or kV according to patient size and/or use of iterative reconstruction technique. COMPARISON:  CT chest 07/11/2015. FINDINGS: CT CHEST FINDINGS Cardiovascular: Atherosclerotic calcification of the aorta, aortic valve and coronary arteries. Heart is enlarged. No pericardial effusion. Mediastinum/Nodes: Mediastinal lymph nodes measure up to 10 mm in the low right paratracheal station, as before. Hilar regions are difficult to  evaluate without IV contrast. Calcified subcarinal lymph node. Surgical clips in the left axilla. No axillary adenopathy. Esophagus is grossly unremarkable. Lungs/Pleura: Pleuroparenchymal scarring in the apical right upper lobe. Centrilobular emphysema. Additional pleuroparenchymal scarring the lower right hemithorax. Basilar septal thickening with trace right and small left pleural effusions. Small cavitary nodule in the left lower lobe measures 0.9 x 1.1 cm (3/106), new from 07/11/2015. Adjacent 3 mm lateral left lower lobe nodule (3/109), unchanged. Per Fleischner Society guidelines, no follow-up is necessary. Left lower lobe dependent atelectasis. Cylindrical bronchiectasis. Debris is seen in the airway. Musculoskeletal: Degenerative changes in the spine. Old T10 and T12 compression fractures. T5 superior endplate compression fracture, age indeterminate. No worrisome lytic or sclerotic lesions. CT ABDOMEN PELVIS FINDINGS Hepatobiliary: Liver is unremarkable. Sludge or stones in the gallbladder. No biliary ductal dilatation. Pancreas: Negative. Spleen: Negative. Adrenals/Urinary Tract: Adrenal glands are unremarkable. Stones in the kidneys bilaterally. Low-attenuation lesion in the lower pole right kidney. No specific follow-up necessary. Kidneys are otherwise unremarkable. Ureters are decompressed. Bladder is grossly unremarkable. Stomach/Bowel: Moderate hiatal hernia. Stomach, small bowel, appendix and colon are unremarkable. Vascular/Lymphatic:  Atherosclerotic calcification of the aorta. Left inguinal lymph nodes measure up to 1.3 cm. No additional pathologically enlarged lymph nodes. Reproductive: Calcified uterine fibroids.  No adnexal mass. Other: No free fluid. Small right inguinal hernia contains fat. Mesenteries and peritoneum are unremarkable. Musculoskeletal: Old right pubic rami fractures. Osteopenia. Degenerative changes in the spine. Severe dextroconvex scoliosis of the lumbar spine. Degenerative changes in the hips. No worrisome lytic or sclerotic lesions. IMPRESSION: 1. Cavitary left lower lobe nodule is new from 07/11/2015 and worrisome for malignancy. An infectious etiology could also have this appearance. Recommend short-term follow-up CT chest without contrast in 3-4 weeks in further initial evaluation. 2. Congestive heart failure. 3. Borderline left inguinal adenopathy, possibly reactive. 4. Cylindrical bronchiectasis. 5. Gallbladder sludge and/or stones. 6. Bilateral renal stones. 7. Moderate hiatal hernia. 8. Age-indeterminate T5 superior endplate compression fracture. 9. Aortic atherosclerosis (ICD10-I70.0). 10.  Emphysema (ICD10-J43.9). Electronically Signed   By: Lorin Picket M.D.   On: 06/18/2022 09:25   DG Chest Port 1 View  Result Date: 06/18/2022 CLINICAL DATA:  82 year old female who woke at 0400 hours with chills and shortness of breath. EXAM: PORTABLE CHEST 1 VIEW COMPARISON:  Portable chest 05/25/2022 and earlier. FINDINGS: Portable AP upright view at 0735 hours. Chronic hyperinflation. Chronic right lung bronchiectasis and scarring demonstrated by CT in 2017. Some centrilobular emphysema also evident at that time. Mildly lower lung volumes today. Calcified aortic atherosclerosis. Stable cardiac size and mediastinal contours. Cardiac size within normal limits. No pneumothorax, pulmonary edema, pleural effusion or acute pulmonary opacity. Visualized tracheal air column is within normal limits. Osteopenia. No acute  osseous abnormality identified. Left axillary no dissection clips are stable. IMPRESSION: Chronic lung disease with Bronchiectasis, Emphysema (ICD10-J43.9), scarring. No acute cardiopulmonary abnormality. Electronically Signed   By: Genevie Ann M.D.   On: 06/18/2022 07:44     Discharge Exam: Vitals:   07/10/22 0800 07/10/22 0819  BP:  (!) 157/73  Pulse:  (!) 58  Resp: 13 16  Temp:  98.1 F (36.7 C)  SpO2: 97% 97%   Vitals:   07/10/22 0600 07/10/22 0754 07/10/22 0800 07/10/22 0819  BP:    (!) 157/73  Pulse:    (!) 58  Resp: 12  13 16   Temp:    98.1 F (36.7 C)  TempSrc:    Oral  SpO2:  94% 97% 97%  Weight:  Height:        General: Pt is alert, awake, not in acute distress Cardiovascular: RRR, S1/S2 +, no rubs, no gallops Respiratory: Crackles at bases bilaterally. Abdominal: Soft, NT, ND, bowel sounds + Extremities: no edema, no cyanosis    The results of significant diagnostics from this hospitalization (including imaging, microbiology, ancillary and laboratory) are listed below for reference.     Microbiology: Recent Results (from the past 240 hour(s))  Resp panel by RT-PCR (RSV, Flu A&B, Covid) Anterior Nasal Swab     Status: None   Collection Time: 07/09/22 12:56 PM   Specimen: Anterior Nasal Swab  Result Value Ref Range Status   SARS Coronavirus 2 by RT PCR NEGATIVE NEGATIVE Final    Comment: (NOTE) SARS-CoV-2 target nucleic acids are NOT DETECTED.  The SARS-CoV-2 RNA is generally detectable in upper respiratory specimens during the acute phase of infection. The lowest concentration of SARS-CoV-2 viral copies this assay can detect is 138 copies/mL. A negative result does not preclude SARS-Cov-2 infection and should not be used as the sole basis for treatment or other patient management decisions. A negative result may occur with  improper specimen collection/handling, submission of specimen other than nasopharyngeal swab, presence of viral mutation(s) within  the areas targeted by this assay, and inadequate number of viral copies(<138 copies/mL). A negative result must be combined with clinical observations, patient history, and epidemiological information. The expected result is Negative.  Fact Sheet for Patients:  EntrepreneurPulse.com.au  Fact Sheet for Healthcare Providers:  IncredibleEmployment.be  This test is no t yet approved or cleared by the [place] FDA and  has been authorized for detection and/or diagnosis of SARS-CoV-2 by FDA under an Emergency Use Authorization (EUA). This EUA will remain  in effect (meaning this test can be used) for the duration of the COVID-19 declaration under Section 564(b)(1) of the Act, 21 U.S.C.section 360bbb-3(b)(1), unless the authorization is terminated  or revoked sooner.       Influenza A by PCR NEGATIVE NEGATIVE Final   Influenza B by PCR NEGATIVE NEGATIVE Final    Comment: (NOTE) The Xpert Xpress SARS-CoV-2/FLU/RSV plus assay is intended as an aid in the diagnosis of influenza from Nasopharyngeal swab specimens and should not be used as a sole basis for treatment. Nasal washings and aspirates are unacceptable for Xpert Xpress SARS-CoV-2/FLU/RSV testing.  Fact Sheet for Patients: EntrepreneurPulse.com.au  Fact Sheet for Healthcare Providers: IncredibleEmployment.be  This test is not yet approved or cleared by the [place] FDA and has been authorized for detection and/or diagnosis of SARS-CoV-2 by FDA under an Emergency Use Authorization (EUA). This EUA will remain in effect (meaning this test can be used) for the duration of the COVID-19 declaration under Section 564(b)(1) of the Act, 21 U.S.C. section 360bbb-3(b)(1), unless the authorization is terminated or revoked.     Resp Syncytial Virus by PCR NEGATIVE NEGATIVE Final    Comment: (NOTE) Fact Sheet for  Patients: EntrepreneurPulse.com.au  Fact Sheet for Healthcare Providers: IncredibleEmployment.be  This test is not yet approved or cleared by the [place] FDA and has been authorized for detection and/or diagnosis of SARS-CoV-2 by FDA under an Emergency Use Authorization (EUA). This EUA will remain in effect (meaning this test can be used) for the duration of the COVID-19 declaration under Section 564(b)(1) of the Act, 21 U.S.C. section 360bbb-3(b)(1), unless the authorization is terminated or revoked.  Performed at [facility], [address] [place], [place] [address]      Labs: BNP (  last 3 results) Recent Labs    06/18/22 0752 06/22/22 0742 07/09/22 1432  BNP 866.0* 2,405.0* 99991111*   Basic Metabolic Panel: Recent Labs  Lab 07/09/22 1526 07/10/22 0342  NA 137 137  K 4.0 3.3*  CL 104 103  CO2 24 26  GLUCOSE 117* 120*  BUN 38* 34*  CREATININE 1.25* 1.27*  CALCIUM 9.0 8.6*   Liver Function Tests: Recent Labs  Lab 07/09/22 1526  AST 23  ALT 16  ALKPHOS 66  BILITOT 1.1  PROT 8.0  ALBUMIN 3.3*   No results for input(s): "LIPASE", "AMYLASE" in the last 168 hours. No results for input(s): "AMMONIA" in the last 168 hours. CBC: Recent Labs  Lab 07/09/22 1432  WBC 9.9  NEUTROABS 7.7  HGB 12.4  HCT 39.2  MCV 101.3*  PLT 268   Cardiac Enzymes: No results for input(s): "CKTOTAL", "CKMB", "CKMBINDEX", "TROPONINI" in the last 168 hours. BNP: Invalid input(s): "POCBNP" CBG: No results for input(s): "GLUCAP" in the last 168 hours. D-Dimer No results for input(s): "DDIMER" in the last 72 hours. Hgb A1c No results for input(s): "HGBA1C" in the last 72 hours. Lipid Profile No results for input(s): "CHOL", "HDL", "LDLCALC", "TRIG", "CHOLHDL", "LDLDIRECT" in the last 72 hours. Thyroid function studies No results for input(s): "TSH", "T4TOTAL", "T3FREE", "THYROIDAB" in the last 72 hours.  Invalid input(s):  "FREET3" Anemia work up No results for input(s): "VITAMINB12", "FOLATE", "FERRITIN", "TIBC", "IRON", "RETICCTPCT" in the last 72 hours. Urinalysis    Component Value Date/Time   COLORURINE AMBER (A) 03/06/2022 0520   APPEARANCEUR CLEAR 03/06/2022 0520   LABSPEC 1.025 03/06/2022 0520   PHURINE 6.0 03/06/2022 0520   GLUCOSEU NEGATIVE 03/06/2022 0520   HGBUR NEGATIVE 03/06/2022 0520   BILIRUBINUR NEGATIVE 03/06/2022 0520   KETONESUR 5 (A) 03/06/2022 0520   PROTEINUR 100 (A) 03/06/2022 0520   UROBILINOGEN 0.2 02/15/2015 0417   NITRITE NEGATIVE 03/06/2022 0520   LEUKOCYTESUR NEGATIVE 03/06/2022 0520   Sepsis Labs Recent Labs  Lab 07/09/22 1432  WBC 9.9   Microbiology Recent Results (from the past 240 hour(s))  Resp panel by RT-PCR (RSV, Flu A&B, Covid) Anterior Nasal Swab     Status: None   Collection Time: 07/09/22 12:56 PM   Specimen: Anterior Nasal Swab  Result Value Ref Range Status   SARS Coronavirus 2 by RT PCR NEGATIVE NEGATIVE Final    Comment: (NOTE) SARS-CoV-2 target nucleic acids are NOT DETECTED.  The SARS-CoV-2 RNA is generally detectable in upper respiratory specimens during the acute phase of infection. The lowest concentration of SARS-CoV-2 viral copies this assay can detect is 138 copies/mL. A negative result does not preclude SARS-Cov-2 infection and should not be used as the sole basis for treatment or other patient management decisions. A negative result may occur with  improper specimen collection/handling, submission of specimen other than nasopharyngeal swab, presence of viral mutation(s) within the areas targeted by this assay, and inadequate number of viral copies(<138 copies/mL). A negative result must be combined with clinical observations, patient history, and epidemiological information. The expected result is Negative.  Fact Sheet for Patients:  EntrepreneurPulse.com.au  Fact Sheet for Healthcare Providers:   IncredibleEmployment.be  This test is no t yet approved or cleared by the [place] FDA and  has been authorized for detection and/or diagnosis of SARS-CoV-2 by FDA under an Emergency Use Authorization (EUA). This EUA will remain  in effect (meaning this test can be used) for the duration of the COVID-19 declaration under Section 564(b)(1) of  the Act, 21 U.S.C.section 360bbb-3(b)(1), unless the authorization is terminated  or revoked sooner.       Influenza A by PCR NEGATIVE NEGATIVE Final   Influenza B by PCR NEGATIVE NEGATIVE Final    Comment: (NOTE) The Xpert Xpress SARS-CoV-2/FLU/RSV plus assay is intended as an aid in the diagnosis of influenza from Nasopharyngeal swab specimens and should not be used as a sole basis for treatment. Nasal washings and aspirates are unacceptable for Xpert Xpress SARS-CoV-2/FLU/RSV testing.  Fact Sheet for Patients: EntrepreneurPulse.com.au  Fact Sheet for Healthcare Providers: IncredibleEmployment.be  This test is not yet approved or cleared by the [place] FDA and has been authorized for detection and/or diagnosis of SARS-CoV-2 by FDA under an Emergency Use Authorization (EUA). This EUA will remain in effect (meaning this test can be used) for the duration of the COVID-19 declaration under Section 564(b)(1) of the Act, 21 U.S.C. section 360bbb-3(b)(1), unless the authorization is terminated or revoked.     Resp Syncytial Virus by PCR NEGATIVE NEGATIVE Final    Comment: (NOTE) Fact Sheet for Patients: EntrepreneurPulse.com.au  Fact Sheet for Healthcare Providers: IncredibleEmployment.be  This test is not yet approved or cleared by the [place] FDA and has been authorized for detection and/or diagnosis of SARS-CoV-2 by FDA under an Emergency Use Authorization (EUA). This EUA will remain in effect (meaning this test can be used) for  the duration of the COVID-19 declaration under Section 564(b)(1) of the Act, 21 U.S.C. section 360bbb-3(b)(1), unless the authorization is terminated or revoked.  Performed at [facility], [address], [place], [place] [address]      Time coordinating discharge: Over 30 minutes  SIGNED:   Darliss Cheney, MD  [facility] 07/10/2022, 11:47 AM *Please note that this is a verbal dictation therefore any spelling or grammatical errors are due to the "[place] [facility]" system interpretation. If 7PM-7AM, please contact night-coverage [email or web address]

## 2022-07-11 ENCOUNTER — Ambulatory Visit (HOSPITAL_COMMUNITY)
Admission: RE | Admit: 2022-07-11 | Discharge: 2022-07-11 | Disposition: A | Discharge status: Home / Self Care | Payer: Medicare HMO | Source: Ambulatory Visit | Attending: Internal Medicine | Admitting: Internal Medicine

## 2022-07-11 DIAGNOSIS — M810 Age-related osteoporosis without current pathological fracture: Secondary | ICD-10-CM | POA: Diagnosis not present

## 2022-07-12 ENCOUNTER — Encounter (HOSPITAL_COMMUNITY): Payer: Medicare HMO

## 2022-07-12 ENCOUNTER — Telehealth: Payer: Self-pay | Admitting: Cardiovascular Disease

## 2022-07-12 ENCOUNTER — Other Ambulatory Visit: Payer: Self-pay | Admitting: *Deleted

## 2022-07-12 DIAGNOSIS — I480 Paroxysmal atrial fibrillation: Secondary | ICD-10-CM

## 2022-07-12 DIAGNOSIS — M79604 Pain in right leg: Secondary | ICD-10-CM

## 2022-07-12 MED ORDER — APIXABAN 2.5 MG PO TABS
2.5000 mg | ORAL_TABLET | Freq: Two times a day (BID) | ORAL | 0 refills | Status: DC
Start: 2022-07-12 — End: 2022-07-12

## 2022-07-12 MED ORDER — APIXABAN 2.5 MG PO TABS
2.5000 mg | ORAL_TABLET | Freq: Two times a day (BID) | ORAL | 3 refills | Status: DC
Start: 2022-07-12 — End: 2022-07-23

## 2022-07-12 NOTE — Telephone Encounter (Signed)
*  STAT* If patient is at the pharmacy, call can be transferred to refill team.   1. Which medications need to be refilled? (please list name of each medication and dose if known) ELIQUIS 2.5 MG TABS tablet   2. Which pharmacy/location (including street and city if local pharmacy) is medication to be sent to? [place], [place], [place] to [facility]   3. Do they need a 30 day or 90 day supply? 2 week supply sent to  her local pharmacy and a 90 day supply sent to [facility], because she is completely out.

## 2022-07-12 NOTE — Telephone Encounter (Signed)
Prescription refill request for Eliquis received. Indication: Afib  Last office visit: 07/04/22 Johnsie Cancel)  Scr: 1.27 (07/10/22)  Age: 82 Weight: 41.7kg  Appropriate dose. Refill sent.

## 2022-07-13 ENCOUNTER — Ambulatory Visit: Payer: Medicare HMO | Attending: Student | Admitting: Student

## 2022-07-13 ENCOUNTER — Encounter: Payer: Self-pay | Admitting: Student

## 2022-07-13 VITALS — BP 136/74 | HR 57 | Ht 62.0 in | Wt 90.0 lb

## 2022-07-13 DIAGNOSIS — I2510 Atherosclerotic heart disease of native coronary artery without angina pectoris: Secondary | ICD-10-CM

## 2022-07-13 DIAGNOSIS — I480 Paroxysmal atrial fibrillation: Secondary | ICD-10-CM

## 2022-07-13 DIAGNOSIS — I484 Atypical atrial flutter: Secondary | ICD-10-CM

## 2022-07-13 DIAGNOSIS — E876 Hypokalemia: Secondary | ICD-10-CM | POA: Diagnosis not present

## 2022-07-13 DIAGNOSIS — E875 Hyperkalemia: Secondary | ICD-10-CM

## 2022-07-13 DIAGNOSIS — I5022 Chronic systolic (congestive) heart failure: Secondary | ICD-10-CM | POA: Diagnosis not present

## 2022-07-13 DIAGNOSIS — I10 Essential (primary) hypertension: Secondary | ICD-10-CM

## 2022-07-13 DIAGNOSIS — E785 Hyperlipidemia, unspecified: Secondary | ICD-10-CM

## 2022-07-13 NOTE — Progress Notes (Signed)
Cardiology Clinic Note   Date: 07/13/2022 ID: [name], [name] July 27, 1940, MRN [id number]  Primary Cardiologist:  Charlton Haws, MD  Patient Profile    [name] is a 82 y.o. female who presents to the clinic today for hospital follow-up.  Past medical history significant for: CAD. LHC 10/14/2009 (ACS): Nonobstructive coronary artery disease. Chronic systolic heart failure. Echo 05/24/2022: EF 40 to 45%.  Global hypokinesis.  Mild asymmetric LVH of the septal segment.  LV diastolic parameters indeterminate.  Mildly elevated pulmonary artery systolic pressure.  Moderate LAE.  Mild to moderate MR.  Mild AI.  Aortic valve sclerosis/calcification without stenosis. PAF/a-flutter. PAD. Abdominal aortogram with lower extremities 12/19/2021: Stent to left superficial femoral artery.  Drug-coated balloon angioplasty left popliteal artery.  Angioplasty left peroneal artery. S/p left great toe amputation 12/29/2021. Arterial ultrasound lower extremity and ABI 01/15/2022: Left: Patent stent with no stenosis.  30 to 49% mid popliteal artery stenosis.  Normal ABI/TBI on the right.  Normal ABI on the left. Carotid artery disease. Followed by Dr. Cory Roughen and vascular surgery for right subclavian steal with occluded right innominate-failed to previous stenting. Carotid ultrasound 03/16/2021: Right carotid artery: Similar tardus parvus waveforms throughout the right carotid system suggestive of severe stenosis of the carotid inflow such as right brachiocephalic disease centrally.  Left ICA stenosis remains 50 to 69%.  Unchanged retrograde flow in the right vertebral artery.  Normal antegrade flow in the left vertebral artery. Hypertension. Hyperlipidemia. Lipid panel 12/20/2021: LDL 66, HDL 34, TG 46, total 109. COPD. GERD. CVA.   History of Present Illness    [name] is a longtime patient of Dr. Edition followed for the above outlined history.  Patient was last seen at the office by Dr. Eden Emms on  07/04/2022 following hospitalization for sepsis from pneumonia and volume overload with HFrEF (newly decreased EF).  She was found to be in atypical flutter.  She underwent diuresis but no attempts made at [facility].  She was found to be in rapid flutter in the office and was willing to try amiodarone as she had worsening tremors even when it was stopped.  Plan for amiodarone load and DCCV.  Most recently, patient presented to the ER on 07/09/2022 with complaints of orthopnea, hypertension, and feeling like she is in A-fib again.  Patient was admitted for observation and diuresed with IV Lasix.  Patient expressed she did not feel current dose of Lasix was working and requested increase to 40 mg.  She was discharged with increased dose of Lasix 20 mg twice a day.  She was rate controlled A-fib while in the ER.  Today, patient is accompanied by her husband.  She is feeling greatly improved from Monday. She reports she no longer has orthopnea or palpitations since increasing Lasix.  She does not have consistent cardiac awareness of A-fib.  She denies chest pain, DOE, lower extremity edema, abdominal bloating/fullness, or early satiety.  She is tolerating her medications.  No bleeding concerns today.    ROS: All other systems reviewed and are otherwise negative except as noted in History of Present Illness.  Studies Reviewed    ECG is not ordered today.  Risk Assessment/Calculations     CHA2DS2-VASc Score = 8   This indicates a 10.8% annual risk of stroke. The patient's score is based upon: CHF History: 1 HTN History: 1 Diabetes History: 0 Stroke History: 2 Vascular Disease History: 1 Age Score: 2 Gender Score: 1  Physical Exam    VS:  BP 136/74   Pulse (!) 57   Ht 5\' 2"  (1.575 m)   Wt 90 lb (40.8 kg)   SpO2 94%   BMI 16.46 kg/m  , BMI Body mass index is 16.46 kg/m.  GEN: Well nourished, well developed, in no acute distress. Neck: No JVD or carotid bruits. Cardiac:  Irregular rhythm, controlled rate. No murmurs. No rubs or gallops.   Respiratory:  Respirations regular and unlabored. Clear to auscultation without rales, wheezing or rhonchi. GI: Soft, nontender, nondistended. Extremities: Radials/DP/PT 2+ and equal bilaterally. No clubbing or cyanosis. No edema.  Skin: Warm and dry, no rash. Neuro: Strength intact.  Assessment & Plan   Chronic systolic heart failure.  Hospital admission 06/18/2022 to 06/24/2022 for volume overload and HFrEF.  Echo February March 2024 showed EF 40 to 45% mild to moderate MR, mild AI.  Patient returns to the ER 07/09/2022 with complaints of orthopnea.  She was diuresed with IV Lasix.  She requested an increase to her p.o. Lasix and was instructed to take 20 mg twice daily.  Patient reports resolution of orthopnea since increasing Lasix.  She denies lower extremity edema, abdominal bloating/fullness, or early satiety.  Euvolemic and well compensated on exam.  Continue Lasix at increased dose and metoprolol. Hypokalemia.  Potassium on 07/10/2022 was 3.3.  Patient denies muscle cramping or fatigue.  Will get a BMP today.  Patient is instructed to take an extra dose of potassium x 2 days.  Will see results of BMP before making any further adjustments. PAF/A-flutter.  Patient was recently loaded on amiodarone and is awaiting DCCV 08/03/2022.  She reports no further palpitations since Monday.  She has a regular rhythm on auscultation today.  No  missed doses of Eliquis.  She denies spontaneous bleeding concerns today.  Continue amiodarone, metoprolol, Eliquis. Appropriate Eliquis dose.  Cardioversion planned 08/03/2022. CAD.  LHC July 2011 showed nonobstructive CAD.  Patient denies chest pain, pressure, tightness.  Continue Eliquis, Plavix, metoprolol, pravastatin. Hypertension.  BP today 136/74.  Patient denies headaches or dizziness.  Continue metoprolol. Hyperlipidemia.  LDL September 2023 66, goal.  Continue pravastatin.  Disposition: BMP  today.  Take an extra dose of potassium x 2.  Will make further recommendations based on results of BMP.  Patient is scheduled for cardioversion 08/03/2022.         Signed, Etta Grandchild. Durga Saldarriaga, DNP, NP-C

## 2022-07-13 NOTE — Patient Instructions (Signed)
Medication Instructions:  Your physician recommends that you continue on your current medications as directed. Please refer to the Current Medication list given to you today.   Please take an extra dosage of potassium Saturday and Sunday. Go back to your normal dosage Monday! We will reach out to you with further instructions on Monday.   *If you need a refill on your cardiac medications before your next appointment, please call your pharmacy*   Lab Work: Your physician recommends that you have the following lab drawn today: BMET  If you have labs (blood work) drawn today and your tests are completely normal, you will receive your results only by: MyChart Message (if you have MyChart) OR A paper copy in the mail If you have any lab test that is abnormal or we need to change your treatment, we will call you to review the results.   Testing/Procedures: NONE   Follow-Up: At [facility], you and your health needs are our priority.  As part of our continuing mission to provide you with exceptional heart care, we have created designated Provider Care Teams.  These Care Teams include your primary Cardiologist (physician) and Advanced Practice Providers (APPs -  Physician Assistants and Nurse Practitioners) who all work together to provide you with the care you need, when you need it.  We recommend signing up for the patient portal called "MyChart".  Sign up information is provided on this After Visit Summary.  MyChart is used to connect with patients for Virtual Visits (Telemedicine).  Patients are able to view lab/test results, encounter notes, upcoming appointments, etc.  Non-urgent messages can be sent to your provider as well.   To learn more about what you can do with MyChart, go to [email or web address].

## 2022-07-14 LAB — BASIC METABOLIC PANEL
BUN/Creatinine Ratio: 23 (ref 12–28)
BUN: 34 mg/dL — ABNORMAL HIGH (ref 8–27)
CO2: 24 mmol/L (ref 20–29)
Calcium: 9.8 mg/dL (ref 8.7–10.3)
Chloride: 100 mmol/L (ref 96–106)
Creatinine, Ser: 1.49 mg/dL — ABNORMAL HIGH (ref 0.57–1.00)
Glucose: 86 mg/dL (ref 70–99)
Potassium: 4.6 mmol/L (ref 3.5–5.2)
Sodium: 141 mmol/L (ref 134–144)
eGFR: 34 mL/min/{1.73_m2} — ABNORMAL LOW (ref >59)

## 2022-07-18 ENCOUNTER — Ambulatory Visit (HOSPITAL_COMMUNITY)
Admission: RE | Admit: 2022-07-18 | Discharge: 2022-07-18 | Disposition: A | Discharge status: Home / Self Care | Payer: Medicare HMO | Source: Ambulatory Visit | Attending: Cardiovascular Disease | Admitting: Cardiovascular Disease

## 2022-07-18 DIAGNOSIS — R0989 Other specified symptoms and signs involving the circulatory and respiratory systems: Secondary | ICD-10-CM

## 2022-07-22 NOTE — Progress Notes (Unsigned)
HISTORY AND PHYSICAL     CC:  follow up. Requesting Provider:  Benita Stabile, MD  HPI: This is a 82 y.o. female who is here today for follow up for PAD.  Pt has hx of   Pt has hx of carotid artery stenosis that is followed by Dr. Eden Emms.  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     The patient has a history of breast cancer, status post left mastectomy.  She is on Eliquis for atrial fibrillation.    She came to the office 12/15/2021 with her husband of 53 years. She had wounds on the left foot and was found to have critical limb ischemia and was scheduled for angiogram. On 12/19/2021 she underwent angiography with stent to the left SFA and drug coated balloon angioplasty of the left popliteal artery by Dr. Myra Gianotti. She was kept overnight and eliquis was restarted the next day and plavix continued. She was discharged home. She was admitted to the hospital later in the month with leukocytosis. She was having diarrhea and it was not felt her foot was the source of the leukocytosis. Her leukocytosis continued to improved and podiatry was consulted and she underwent debridement of left foot and amputation of left great toe with application of biologic by Dr. Allena Katz on 12/29/2021.   Pt was last seen 01/15/2022 and at that time, her foot was feeling better and she was not having any pain.  She was following with podiatry for her toe wound.    The pt returns today for follow up.  She states that her toe amputation site and left foot have healed.  She states that she has a wound on the lateral aspect of the left leg.  She states she is using Santyl and this is slowly improving.  She states that she has gotten them for many years and they always heal.  It is not painful.  She denies any pain in the feet or claudication.  She states she is getting a cardioversion with Dr. Eden Emms next week.  She has been started on her amiodarone and this has  made her tremor worse.    She recently had carotid duplex that revealed no stenosis on the right and 40-59% left ICA stenosis.   The pt is on a statin for cholesterol management.    The pt is not on an aspirin.    Other AC:  Plavix/Eliquis The pt is on BB, diuretic for hypertension.  The pt is not on medication for diabetes. Tobacco hx:  former   Past Medical History:  Diagnosis Date   Anemia 05/2014.   Atrial fibrillation    x 3 yrs   Breast cancer    S/P left mastectomy and chemotherapy 1989 remained in remission   Carotid artery occlusion    Carotid bruit    LICA 60-79% (duplex 9/11)   Degenerative arthritis of spine 2015   Chronic back pain   Hyperlipidemia    Hypertension    Meningioma    Osteoporosis    Pneumonia May 19, 2014   Stroke    CVA 2008   Subclavian steal syndrome    Ulcers of both lower extremities 2015    Past Surgical History:  Procedure Laterality Date   ABDOMINAL AORTOGRAM W/LOWER EXTREMITY N/A 12/19/2021   Procedure: ABDOMINAL AORTOGRAM W/LOWER EXTREMITY;  Surgeon: Nada Libman, MD;  Location: [facility];  Service: Cardiovascular;  Laterality: N/A;  AMPUTATION TOE Left 12/29/2021   Procedure: Incisional DEBRIDEMENT Left Foot with Amputation Left Great Toe;  Surgeon: Candelaria Stagers, DPM;  Location: [facility] OR;  Service: Podiatry;  Laterality: Left;   CARDIAC CATHETERIZATION     CATARACT EXTRACTION Bilateral 2013   COLONOSCOPY  11/17/2010   Procedure: COLONOSCOPY;  Surgeon: Malissa Hippo, MD;  Location: [facility];  Service: Endoscopy;  Laterality: N/A;  10:45 am   ESOPHAGOGASTRODUODENOSCOPY  11/17/2010   Procedure: ESOPHAGOGASTRODUODENOSCOPY (EGD);  Surgeon: Malissa Hippo, MD;  Location: [facility];  Service: Endoscopy;  Laterality: N/A;   ESOPHAGOGASTRODUODENOSCOPY N/A 02/16/2015   Procedure: ESOPHAGOGASTRODUODENOSCOPY (EGD);  Surgeon: Ruffin Frederick, MD;  Location: [facility] ENDOSCOPY;  Service: Gastroenterology;  Laterality:  N/A;   EXPLORATORY LAPAROTOMY  1960s   For peritonitis of undetermined cause   EYE SURGERY     LUMBAR EPIDURAL INJECTION  06-2012--06-2013   pt. states she has had 5 epidurals in 06-2012----06-2013   MASTECTOMY Left 1990   PERIPHERAL VASCULAR INTERVENTION Left 12/19/2021   Procedure: PERIPHERAL VASCULAR INTERVENTION;  Surgeon: Nada Libman, MD;  Location: [facility];  Service: Cardiovascular;  Laterality: Left;  SFA and PTA of POP   YAG LASER APPLICATION Right 05/03/2015   Procedure: YAG LASER APPLICATION;  Surgeon: Jethro Bolus, MD;  Location: [facility];  Service: Ophthalmology;  Laterality: Right;  right   YAG LASER APPLICATION Left 05/24/2015   Procedure: YAG LASER APPLICATION;  Surgeon: Jethro Bolus, MD;  Location: [facility];  Service: Ophthalmology;  Laterality: Left;    Allergies  Allergen Reactions   Iodine Rash and Other (See Comments)    BETADINE Rash/burning, blisters on skin. Burns skin   Iohexol Rash and Other (See Comments)    Blisters; PT NEEDS 13-HOUR PREP    Sulfa Antibiotics Nausea And Vomiting   Petroleum Gauze Non-Woven 3x9" [Wound Dressings] Other (See Comments)    BURNING SENSATION, RED SKIN   Povidone-Iodine Other (See Comments)    Burns skin   Wound Dressing Adhesive Other (See Comments)    Burns skin   Tape Itching, Rash and Other (See Comments)    "DO NOT USE ADHESIVE TAPE" Not even a Band Aid" NO PAPER TAPE Burns skin Skin is very very thin    Current Outpatient Medications  Medication Sig Dispense Refill   acetaminophen (TYLENOL) 325 MG tablet Take 2 tablets (650 mg total) by mouth every 6 (six) hours as needed for mild pain (or Fever >/= 101).     amiodarone (PACERONE) 200 MG tablet Take 1 tablet (200 mg total) by mouth 2 (two) times daily. 180 tablet 3   apixaban (ELIQUIS) 2.5 MG TABS tablet Take 1 tablet (2.5 mg total) by mouth 2 (two) times daily. 180 tablet 3   ascorbic acid (VITAMIN C) 500 MG tablet Take 500 mg by mouth daily.      CALCIUM PO Take 1,200 mg by mouth 2 (two) times daily.     Cholecalciferol (VITAMIN D-3 PO) Take 3 tablets by mouth every morning. 3000 units total     clopidogrel (PLAVIX) 75 MG tablet TAKE 1 TABLET EVERY MORNING 90 tablet 3   denosumab (PROLIA) 60 MG/ML SOSY injection Inject 60 mg into the skin every 6 (six) months.     DENTA 5000 PLUS 1.1 % CREA dental cream Place 1 Application onto teeth in the morning.     Ferrous Sulfate (IRON PO) Take 65 mg by mouth daily at 6 (six) AM.     furosemide (  LASIX) 20 MG tablet Take 1 tablet (20 mg total) by mouth 2 (two) times daily. 30 tablet 0   MAGNESIUM OXIDE PO Take 500 mg by mouth every morning.     metoprolol tartrate (LOPRESSOR) 25 MG tablet TAKE 3 TABLETS TWICE A DAY 540 tablet 3   Multiple Vitamin (MULTIVITAMIN WITH MINERALS) TABS tablet Take 1 tablet by mouth every morning.     pantoprazole (PROTONIX) 40 MG tablet TAKE 1 TABLET DAILY 90 tablet 3   potassium chloride (KLOR-CON M) 10 MEQ tablet Take 1 tablet (10 mEq total) by mouth daily. 30 tablet 2   pravastatin (PRAVACHOL) 40 MG tablet Take 1 tablet (40 mg total) by mouth daily. 90 tablet 3   SANTYL 250 UNIT/GM ointment APPLY TO AFFECTED AREAS ONCE DAILY. (Patient taking differently: 1 Application daily.) 30 g 0   umeclidinium bromide (INCRUSE ELLIPTA) 62.5 MCG/ACT AEPB Inhale 1 puff into the lungs daily. 90 each 3   No current facility-administered medications for this visit.    Family History  Problem Relation Age of Onset   Alzheimer's disease Mother    Dementia Mother    Hypertension Mother    Hyperlipidemia Mother    Parkinsonism Father     Social History   Socioeconomic History   Marital status: Married    Spouse name: Not on file   Number of children: 2   Years of education: Not on file   Highest education level: Not on file  Occupational History   Occupation: Retired    Associate Professor: RETIRED  Tobacco Use   Smoking status: Former    Packs/day: 1.00    Years: 56.00     Additional pack years: 0.00    Total pack years: 56.00    Types: Cigarettes    Quit date: 08/14/2006    Years since quitting: 15.9    Passive exposure: Never   Smokeless tobacco: Never   Tobacco comments:    quit in 2008  Vaping Use   Vaping Use: Never used  Substance and Sexual Activity   Alcohol use: No    Alcohol/week: 0.0 standard drinks of alcohol   Drug use: No   Sexual activity: Never  Other Topics Concern   Not on file  Social History Narrative   Not on file   Social Determinants of Health   Financial Resource Strain: Not on file  Food Insecurity: No Food Insecurity (07/10/2022)   Hunger Vital Sign    Worried About Running Out of Food in the Last Year: Never true    Ran Out of Food in the Last Year: Never true  Transportation Needs: No Transportation Needs (07/10/2022)   PRAPARE - Administrator, Civil Service (Medical): No    Lack of Transportation (Non-Medical): No  Physical Activity: Not on file  Stress: Not on file  Social Connections: Not on file  Intimate Partner Violence: Not At Risk (07/10/2022)   Humiliation, Afraid, Rape, and Kick questionnaire    Fear of Current or Ex-Partner: No    Emotionally Abused: No    Physically Abused: No    Sexually Abused: No     REVIEW OF SYSTEMS:    denotes positive finding,  denotes negative finding Cardiac  Comments:  Chest pain or chest pressure:    Shortness of breath upon exertion:    Short of breath when lying flat:    Irregular heart rhythm:        Vascular    Pain in calf, thigh, or  hip brought on by ambulation:    Pain in feet at night that wakes you up from your sleep:     Blood clot in your veins:    Leg swelling:         Pulmonary    Oxygen at home:    Productive cough:     Wheezing:         Neurologic    Sudden weakness in arms or legs:     Sudden numbness in arms or legs:     Sudden onset of difficulty speaking or slurred speech:    Temporary loss of vision in one eye:      Problems with dizziness:         Gastrointestinal    Blood in stool:     Vomited blood:         Genitourinary    Burning when urinating:     Blood in urine:        Psychiatric    Major depression:         Hematologic    Bleeding problems:    Problems with blood clotting too easily:        Skin    Rashes or ulcers:        Constitutional    Fever or chills:      PHYSICAL EXAMINATION:  Today's Vitals   07/23/22 1112  BP: (!) 172/73  Pulse: 60  Resp: 14  Temp: (!) 97.4 F (36.3 C)  TempSrc: Temporal  SpO2: 100%  Weight: 90 lb (40.8 kg)  Height:  (1.575 m)   Body mass index is 16.46 kg/m.   General:  WDWN in NAD; vital signs documented above Gait: Not observed HENT: WNL, normocephalic Pulmonary: normal non-labored breathing , without wheezing Cardiac: irregular HR, without carotid bruits Abdomen: soft, NT; aortic pulse is not palpable Skin: without rashes Vascular Exam/Pulses:  Right Left  Radial 2+ (normal) 2+ (normal)  Femoral 2+ (normal) 2+ (normal)  DP monophasic Brisk biphasic  PT Unable to doppler Brisk biphasic  Peroneal Brisk monophasic Brisk monophasic   Extremities: left great toe amputation and dorsum of foot has healed.  She has a small wound on the lateral aspect   Musculoskeletal: no muscle wasting or atrophy  Neurologic: A&O X 3 Psychiatric:  The pt has Normal affect.   Non-Invasive Vascular Imaging:   ABI's/TBI's on 07/23/2022: Right:  0.73/0.82 - Great toe pressure: 159 Left:  1.01/0.99 - Great toe pressure: amputation  Arterial duplex on 07/23/2022: +-----------+--------+-----+---------------+---------+--------+  LEFT      PSV cm/sRatioStenosis       Waveform Comments  +-----------+--------+-----+---------------+---------+--------+  CFA Distal 123                         biphasic           +-----------+--------+-----+---------------+---------+--------+  DFA       141                         triphasic           +-----------+--------+-----+---------------+---------+--------+  SFA Prox                                        Stent     +-----------+--------+-----+---------------+---------+--------+  SFA Mid  Stent     +-----------+--------+-----+---------------+---------+--------+  SFA Distal                                      Stent     +-----------+--------+-----+---------------+---------+--------+  POP Prox   51                          biphasic           +-----------+--------+-----+---------------+---------+--------+  POP Distal 117                         biphasic           +-----------+--------+-----+---------------+---------+--------+  TP Trunk   318          50-74% stenosistriphasic          +-----------+--------+-----+---------------+---------+--------+  ATA Prox   37                          biphasic           +-----------+--------+-----+---------------+---------+--------+  ATA Mid    47                          biphasic           +-----------+--------+-----+---------------+---------+--------+  ATA Distal 40                          biphasic           +-----------+--------+-----+---------------+---------+--------+  PTA Prox   26                          biphasic           +-----------+--------+-----+---------------+---------+--------+  PTA Mid                 occluded                          +-----------+--------+-----+---------------+---------+--------+  PTA Distal 28                          biphasic           +-----------+--------+-----+---------------+---------+--------+  PERO Prox  301          50-74% stenosisbiphasic           +-----------+--------+-----+---------------+---------+--------+  PERO Mid   58                          biphasic           +-----------+--------+-----+---------------+---------+--------+  PERO Distal45                           biphasic           +-----------+--------+-----+---------------+---------+--------+     Left Stent(s):  +---------------+--------+--------+--------+--------+  SFA           PSV cm/sStenosisWaveformComments  +---------------+--------+--------+--------+--------+  Prox to Stent  113             biphasic          +---------------+--------+--------+--------+--------+  Proximal Stent 79  biphasic          +---------------+--------+--------+--------+--------+  Mid Stent      48              biphasic          +---------------+--------+--------+--------+--------+  Distal Stent   32              biphasic          +---------------+--------+--------+--------+--------+  Distal to Stent48              biphasic          +---------------+--------+--------+--------+--------+    Summary:  Left: 50-74% stenosis noted in the peroneal artery ostium. 50-74% stenosis noted in the tibioperoneal trunk. Patent stent with no evidence of stenosis in the superficial femoral artery.   Previous ABI's/TBI's on 01/15/2022: Right:  1.08/0.86 - Great toe pressure: 98 Left:  0.99/not done - Great toe pressure:  bandage  Previous arterial duplex on 01/15/2022: +-----------+--------+-----+--------+-------------------+-------------+  LEFT      PSV cm/sRatioStenosisWaveform           Comments       +-----------+--------+-----+--------+-------------------+-------------+  CFA Distal 135                  triphasic          stent inflow   +-----------+--------+-----+--------+-------------------+-------------+  DFA       132                  triphasic                         +-----------+--------+-----+--------+-------------------+-------------+  SFA Prox                                           stent          +-----------+--------+-----+--------+-------------------+-------------+  SFA Mid                                             stent          +-----------+--------+-----+--------+-------------------+-------------+  SFA Distal 58                                      stent outflow  +-----------+--------+-----+--------+-------------------+-------------+  POP Prox   56                   monophasic                        +-----------+--------+-----+--------+-------------------+-------------+  POP Mid    153                  triphasic                         +-----------+--------+-----+--------+-------------------+-------------+  POP Distal 105                  monophasic                        +-----------+--------+-----+--------+-------------------+-------------+  ATA Distal 76  monophasic                        +-----------+--------+-----+--------+-------------------+-------------+  PTA Distal 17                   dampened monophasic               +-----------+--------+-----+--------+-------------------+-------------+  PERO Distal73                   triphasic                         +-----------+--------+-----+--------+-------------------+-------------+   A focal velocity elevation of 153 cm/s was obtained at mid SFA with post  stenotic turbulence with a VR of 2.6. Findings are characteristic of 30-49% stenosis. By velocity ratio, stenosis is 50-74%    Left Stent(s):  +------------------------+--------+--------+----------+--------+  origin SFA to distal SFAPSV cm/sStenosisWaveform  Comments  +------------------------+--------+--------+----------+--------+  Prox to Stent           135             triphasic           +------------------------+--------+--------+----------+--------+  Proximal Stent          60              triphasic           +------------------------+--------+--------+----------+--------+  Mid Stent               48              monophasic           +------------------------+--------+--------+----------+--------+  Distal Stent            43              monophasic          +------------------------+--------+--------+----------+--------+  Distal to Stent         58              monophasic          +------------------------+--------+--------+----------+--------+   Summary:  Left: Patent stent with no stenosis.  30-49% mid popliteal artery stenosis by velocity, 50-74% by velocity ratio     ASSESSMENT/PLAN:: 82 y.o. female here for follow up for PAD with hx of angiography with stent to the left SFA and drug coated balloon angioplasty of the left popliteal artery 12/19/2021 by Dr. Myra Gianotti.   -pt with brisk left DP/PT/peroneal doppler flow with ABI of 99% on the left.  She does not have any rest pain or claudication.  She does have some areas of stenosis.  She also has a wound on the lateral left ankle that is certainly improved from photos taken in March but still slow to heal.  Discussed with pt and her husband that we will bring her back in 6 weeks and repeat her studies and check her wounds and see Dr. Myra Gianotti and determine if she needs repeat angiography.  Hopefully they will continue to improve.  Discussed with her and her husband that if the wounds start to worsen, then we need to see her back sooner.  They expressed understanding.  They will continue with Santyl to the wounds on the left lateral ankle.  -continue plavix/eliquis    Doreatha Massed, [facility] [phone]  Clinic MD:   Myra Gianotti

## 2022-07-23 ENCOUNTER — Other Ambulatory Visit: Payer: Self-pay | Admitting: *Deleted

## 2022-07-23 ENCOUNTER — Ambulatory Visit (INDEPENDENT_AMBULATORY_CARE_PROVIDER_SITE_OTHER): Payer: Medicare HMO | Admitting: Physician Assistant

## 2022-07-23 ENCOUNTER — Ambulatory Visit (INDEPENDENT_AMBULATORY_CARE_PROVIDER_SITE_OTHER)
Admission: RE | Admit: 2022-07-23 | Discharge: 2022-07-23 | Disposition: A | Discharge status: Home / Self Care | Payer: Medicare HMO | Source: Ambulatory Visit | Attending: Surgery | Admitting: Surgery

## 2022-07-23 ENCOUNTER — Ambulatory Visit (HOSPITAL_COMMUNITY)
Admission: RE | Admit: 2022-07-23 | Discharge: 2022-07-23 | Disposition: A | Discharge status: Home / Self Care | Payer: Medicare HMO | Source: Ambulatory Visit | Attending: Surgery | Admitting: Surgery

## 2022-07-23 VITALS — BP 172/73 | HR 60 | Temp 97.4°F | Resp 14 | Ht 62.0 in | Wt 90.0 lb

## 2022-07-23 DIAGNOSIS — I739 Peripheral vascular disease, unspecified: Secondary | ICD-10-CM | POA: Diagnosis not present

## 2022-07-23 DIAGNOSIS — I70223 Atherosclerosis of native arteries of extremities with rest pain, bilateral legs: Secondary | ICD-10-CM | POA: Insufficient documentation

## 2022-07-23 DIAGNOSIS — I480 Paroxysmal atrial fibrillation: Secondary | ICD-10-CM

## 2022-07-23 LAB — VAS US ABI WITH/WO TBI
Left ABI: 1.01
Right ABI: 0.73

## 2022-07-23 MED ORDER — APIXABAN 2.5 MG PO TABS
2.5000 mg | ORAL_TABLET | Freq: Two times a day (BID) | ORAL | 1 refills | Status: DC
Start: 2022-07-23 — End: 2023-08-08

## 2022-07-23 NOTE — Telephone Encounter (Signed)
Eliquis 2.5mg  refill request received. Patient is 82 years old, weight-40.8kg, Crea-1.49 on 07/13/22, Diagnosis-Aflutter, and last seen by Caleb Popp on 07/13/22. Dose is appropriate based on dosing criteria. Will send in refill to requested pharmacy.

## 2022-07-30 LAB — CBC WITH DIFFERENTIAL/PLATELET
Basophils Absolute: 0.1 10*3/uL (ref 0.0–0.2)
Basos: 1 %
EOS (ABSOLUTE): 0.2 10*3/uL (ref 0.0–0.4)
Eos: 2 %
Hematocrit: 40.4 % (ref 34.0–46.6)
Hemoglobin: 13.3 g/dL (ref 11.1–15.9)
Immature Grans (Abs): 0 10*3/uL (ref 0.0–0.1)
Immature Granulocytes: 0 %
Lymphocytes Absolute: 1.1 10*3/uL (ref 0.7–3.1)
Lymphs: 12 %
MCH: 31.7 pg (ref 26.6–33.0)
MCHC: 32.9 g/dL (ref 31.5–35.7)
MCV: 96 fL (ref 79–97)
Monocytes Absolute: 0.9 10*3/uL (ref 0.1–0.9)
Monocytes: 9 %
Neutrophils Absolute: 6.9 10*3/uL (ref 1.4–7.0)
Neutrophils: 76 %
Platelets: 289 10*3/uL (ref 150–450)
RBC: 4.19 x10E6/uL (ref 3.77–5.28)
RDW: 14.9 % (ref 11.7–15.4)
WBC: 9.1 10*3/uL (ref 3.4–10.8)

## 2022-07-31 ENCOUNTER — Other Ambulatory Visit: Payer: Self-pay

## 2022-07-31 DIAGNOSIS — I739 Peripheral vascular disease, unspecified: Secondary | ICD-10-CM

## 2022-07-31 DIAGNOSIS — I70223 Atherosclerosis of native arteries of extremities with rest pain, bilateral legs: Secondary | ICD-10-CM

## 2022-07-31 LAB — BASIC METABOLIC PANEL
BUN/Creatinine Ratio: 23 (ref 12–28)
BUN: 27 mg/dL (ref 8–27)
CO2: 22 mmol/L (ref 20–29)
Calcium: 9.8 mg/dL (ref 8.7–10.3)
Chloride: 99 mmol/L (ref 96–106)
Creatinine, Ser: 1.15 mg/dL — ABNORMAL HIGH (ref 0.57–1.00)
Glucose: 92 mg/dL (ref 70–99)
Potassium: 4.7 mmol/L (ref 3.5–5.2)
Sodium: 139 mmol/L (ref 134–144)
eGFR: 47 mL/min/{1.73_m2} — ABNORMAL LOW (ref >59)

## 2022-08-02 NOTE — Progress Notes (Signed)
Spoke with pt's husband, pt unavailable, CV scheduled for 1100, please arrive at hospital by 1000, NPO after after MN, except meds with sips of water in the AM, must have transportation home post procedure and someone to stay with pt for approximately 24 hours

## 2022-08-03 ENCOUNTER — Ambulatory Visit (HOSPITAL_COMMUNITY)
Admission: RE | Admit: 2022-08-03 | Discharge: 2022-08-03 | Disposition: A | Discharge status: Home / Self Care | Payer: Medicare HMO | Source: Ambulatory Visit | Attending: Cardiovascular Disease | Admitting: Cardiovascular Disease

## 2022-08-03 ENCOUNTER — Telehealth: Payer: Self-pay | Admitting: Cardiovascular Disease

## 2022-08-03 ENCOUNTER — Encounter (HOSPITAL_COMMUNITY)
Admission: RE | Disposition: A | Discharge status: Home / Self Care | Payer: Self-pay | Source: Ambulatory Visit | Attending: Cardiovascular Disease

## 2022-08-03 DIAGNOSIS — Z01818 Encounter for other preprocedural examination: Secondary | ICD-10-CM

## 2022-08-03 SURGERY — INVASIVE LAB ABORTED CASE

## 2022-08-03 SURGICAL SUPPLY — 1 items: ELECT DEFIB PAD ADLT CADENCE (PAD) ×2 IMPLANT

## 2022-08-03 NOTE — Progress Notes (Signed)
Pt in sinus rhythm on monitor. EKG obtained. Dr. Lloyd Huger notified. OK to be d/c per Dr. Lloyd Huger

## 2022-08-03 NOTE — Telephone Encounter (Signed)
Left message for patient informing her of lab results. Per Dr. Eden Emms: Labs ok.  Provided office number for callback if any questions.

## 2022-08-03 NOTE — Telephone Encounter (Signed)
Pt was returning nurse call and would like a callback. Please advise.  

## 2022-08-03 NOTE — Interval H&P Note (Signed)
History and Physical Interval Note:  08/03/2022 10:11 AM  [name]  has presented today for surgery, with the diagnosis of Afib.  The various methods of treatment have been discussed with the patient and family. After consideration of risks, benefits and other options for treatment, the patient has consented to  Procedure(s): INVASIVE LAB ABORTED CASE as a surgical intervention.  The patient's history has been reviewed, patient examined, no change in status, stable for surgery.  I have reviewed the patient's chart and labs.  Questions were answered to the patient's satisfaction.     Charlton Haws

## 2022-08-10 ENCOUNTER — Ambulatory Visit (INDEPENDENT_AMBULATORY_CARE_PROVIDER_SITE_OTHER): Payer: Medicare HMO | Admitting: Podiatry

## 2022-08-10 ENCOUNTER — Telehealth: Payer: Self-pay | Admitting: Podiatry

## 2022-08-10 DIAGNOSIS — L97322 Non-pressure chronic ulcer of left ankle with fat layer exposed: Secondary | ICD-10-CM | POA: Diagnosis not present

## 2022-08-10 NOTE — Progress Notes (Signed)
Subjective:  Patient ID: [name], female    DOB: 1940-04-14,  MRN: [id number]  Chief Complaint  Patient presents with   Follow-up    Follow up on left foot amputation, patient has minimal drainage     82 y.o. female returns for post-op check.  Patient states that she is doing okay.  She states that the top of the foot is still doing better.  Ankle wound is still present.  Wound care nurses helping  Review of Systems: Negative except as noted in the HPI. Denies N/V/F/Ch.  Past Medical History:  Diagnosis Date   Anemia 05/2014.   Atrial fibrillation (HCC)    x 3 yrs   Breast cancer (HCC)    S/P left mastectomy and chemotherapy 1989 remained in remission   Carotid artery occlusion    Carotid bruit    LICA 60-79% (duplex 9/11)   Degenerative arthritis of spine 2015   Chronic back pain   Hyperlipidemia    Hypertension    Meningioma (HCC)    Osteoporosis    Pneumonia May 19, 2014   Stroke [facility])    CVA 2008   Subclavian steal syndrome    Ulcers of both lower extremities (HCC) 2015   No current facility-administered medications for this visit. No current outpatient medications on file.  Facility-Administered Medications Ordered in Other Visits:    0.9 %  sodium chloride infusion, , Intravenous, Continuous, Nada Libman, MD, Last Rate: 100 mL/hr at 08/28/22 0555, New Bag at 08/28/22 0555   fentaNYL (SUBLIMAZE) injection, , , PRN, Nada Libman, MD, 25 mcg at 08/28/22 0801   Heparin (Porcine) in NaCl 1000-0.9 UT/500ML-% SOLN, , , PRN, Nada Libman, MD, 500 mL at 08/28/22 0734   lidocaine (PF) (XYLOCAINE) 1 % injection, , , PRN, Nada Libman, MD, 10 mL at 08/28/22 0810   midazolam (VERSED) injection, , , PRN, Nada Libman, MD, 1 mg at 08/28/22 6295  Social History   Tobacco Use  Smoking Status Former   Packs/day: 1.00   Years: 56.00   Additional pack years: 0.00   Total pack years: 56.00   Types: Cigarettes   Quit date: 08/14/2006   Years since  quitting: 16.0   Passive exposure: Never  Smokeless Tobacco Never  Tobacco Comments   quit in 2008    Allergies  Allergen Reactions   Iodine Rash and Other (See Comments)    BETADINE Rash/burning, blisters on skin. Burns skin   Iohexol Rash and Other (See Comments)    Blisters; PT NEEDS 13-HOUR PREP    Sulfa Antibiotics Nausea And Vomiting   Petroleum Gauze Non-Woven 3x9" [Wound Dressings] Other (See Comments)    BURNING SENSATION, RED SKIN   Povidone-Iodine Other (See Comments)    Burns skin   Wound Dressing Adhesive Other (See Comments)    Burns skin   Tape Itching, Rash and Other (See Comments)    "DO NOT USE ADHESIVE TAPE" Not even a Band Aid" NO PAPER TAPE Burns skin Skin is very very thin   Objective:   There were no vitals filed for this visit.  There is no height or weight on file to calculate BMI. Constitutional Well developed. Well nourished.  Vascular Foot warm and well perfused. Capillary refill normal to all digits.   Neurologic Normal speech. Oriented to person, place, and time. Epicritic sensation to light touch grossly present bilaterally.  Dermatologic  Left lateral ankle with fat layer exposed.  No signs of infection noted.  No malodor present does not probe down to bone.  Orthopedic: No tenderness to palpation noted about the surgical site.   Radiographs: None Assessment:   1. Ankle ulcer, left, with fat layer exposed (HCC)      Plan:  Patient was evaluated and treated and all questions answered.  S/p foot surgery left -Clinic healed.  No signs of dehiscence noted.  No complications noted.  Left dorsal foot wound with fat layer exposed -Clinically healed  Left lateral ankle with fat layer exposed -Continue Santyl wet-to-dry dressing.  Appears to be slowly getting better.  Will continue monitor the progression of it.  Minimal debridement was carried out with mechanical debridement. -Patient will benefit from Unna boot compression  dressing to help heal the wound.  She states understand will have home health wound care nurse to begin Unna boot compression No follow-ups on file.    No follow-ups on file.

## 2022-08-10 NOTE — Telephone Encounter (Signed)
Patients home health nurse called she needs orders for the [facility] boot dressing and how often ? Do you want zinc or calamin? Do you want her to continue with the Santyl and wet to dry dressing?     Fax : [phone]

## 2022-08-14 ENCOUNTER — Ambulatory Visit: Payer: Medicare HMO | Admitting: Neurology

## 2022-08-15 NOTE — Progress Notes (Unsigned)
Assessment/Plan:   1.  Essential Tremor.             -She is back on the amiodarone.  They tried her off of it, but she did not do well from a cardiac standpoint.  Unfortunately, we noted that amiodarone worsens her tremor.             -Unfortunately, she is still on Eliquis, so we cannot use primidone in her.             -She is already on a beta-blocker.  She is on metoprolol, 75 mg twice per day.   Subjective:   [name] was seen in consultation in the movement disorder clinic at the request of Wendall Stade, MD.  The evaluation is for tremor.  I saw the patient 1 time back in early 2015 and again in September, 2019.  I have not seen the patient since.  When I saw the patient back in September, 2019 I felt the patient likely had essential tremor, exacerbated by amiodarone.  They had decreased the amiodarone, and patient had noted an improvement in tremor, but she was not able to get all the way off of it.  At that point in time, the patient was on Eliquis and was not able to take primidone and was not a candidate for beta-blockers because of bradycardia and lung function.  She returns today because of complaints of a bothersome tremor.  She is now on metoprolol, 75 mg twice per day.  She is still on Eliquis.  She is off of amiodarone because of tremor.  She was recently hospitalized and after the hospitalization they recommended going back on it.  Patient's husband felt that it worsened the tremor and wanted to stay off of it.  She was subsequently referred here as well as the A-fib clinic.  She did go to the A-fib clinic and saw the PA.  They felt that medication therapy in general would not be a good option because of her recent history of acute congestive heart failure.  They felt that she would only be a candidate for the lowest dose of Tikosyn and it would be too low for the medication to be effective.  Amiodarone previously worsened her hand tremor.  Ultimately patient decided to just  continue with what was her current regiment of metoprolol and Eliquis.  Unfortunately, following that first hospitalization, she was hospitalized again for congestive heart failure and rapid A-fib.  When she followed up with Dr. Admission, amiodarone was reinitiated.    Tremor started approximately decades ago and involves the ***.  Tremor is most noticeable when ***.   There is a family hx of tremor; son with tremor and father with Parkinson's disease.     Affected by caffeine:  No. Affected by alcohol:  Yes.  (But does not drink much) Affected by stress:  Yes.   Affected by fatigue:  Yes.   Spills soup if on spoon:  Yes.   Spills glass of liquid if full:  Yes.   Affects ADL's (tying shoes, brushing teeth, etc):  Yes.    Current medications that may exacerbate tremor:  ***  Outside reports reviewed: {Outside review:15817}.  Allergies  Allergen Reactions   Iodine Rash and Other (See Comments)    BETADINE Rash/burning, blisters on skin. Burns skin   Iohexol Rash and Other (See Comments)    Blisters; PT NEEDS 13-HOUR PREP    Sulfa Antibiotics Nausea And Vomiting   Petroleum  Gauze Non-Woven 3x9" [Wound Dressings] Other (See Comments)    BURNING SENSATION, RED SKIN   Povidone-Iodine Other (See Comments)    Burns skin   Wound Dressing Adhesive Other (See Comments)    Burns skin   Tape Itching, Rash and Other (See Comments)    "DO NOT USE ADHESIVE TAPE" Not even a Band Aid" NO PAPER TAPE Burns skin Skin is very very thin    No outpatient medications have been marked as taking for the 08/16/22 encounter (Appointment) with [place], Octaviano Batty, DO.      Objective:   VITALS:  There were no vitals filed for this visit. Gen:  Appears stated age and in NAD. HEENT:  Normocephalic, atraumatic. The mucous membranes are moist. The superficial temporal arteries are without ropiness or tenderness. Cardiovascular: Regular rate and rhythm. Lungs: Clear to auscultation bilaterally. Neck: There  are no carotid bruits noted bilaterally.  NEUROLOGICAL:  Orientation:  The patient is alert and oriented x 3.  Recent and remote memory are intact.  Attention span and concentration are normal.  Able to name objects and repeat without trouble.  Fund of knowledge is appropriate Cranial nerves: There is good facial symmetry. Fundoscopic exam is attempted but the disc margins are not well visualized bilaterally. Extraocular muscles are intact and visual fields are full to confrontational testing. Speech is fluent and clear. Soft palate rises symmetrically and there is no tongue deviation. Hearing is intact to conversational tone. Tone: Tone is good throughout. Sensation: Sensation is intact to light touch throughout (facial, trunk, extremities). Vibration is intact at the bilateral big toe. There is no extinction with double simultaneous stimulation. There is no sensory dermatomal level identified. Coordination:  The patient has no difficulty with RAM's or FNF bilaterally. Motor: Strength is 5/5 in the bilateral upper and lower extremities.  Shoulder shrug is equal bilaterally.  There is no pronator drift.  There are no fasciculations noted. DTR's: Deep tendon reflexes are 2/4 at the bilateral biceps, triceps, brachioradialis, patella.  Plantar responses are downgoing bilaterally. Gait and Station: The patient uses a transport chair to ambulate.  When she lets go of it, she is very unstable.  She has L foot drop (states from her back).     I have reviewed and interpreted the following labs independently   Chemistry      Component Value Date/Time   NA 139 07/30/2022 0939   K 4.7 07/30/2022 0939   CL 99 07/30/2022 0939   CO2 22 07/30/2022 0939   BUN 27 07/30/2022 0939   CREATININE 1.15 (H) 07/30/2022 0939   CREATININE 0.57 (L) 03/14/2015 1230   GLU 84 08/11/2010 0000      Component Value Date/Time   CALCIUM 9.8 07/30/2022 0939   ALKPHOS 66 07/09/2022 1526   AST 23 07/09/2022 1526   ALT 16  07/09/2022 1526   BILITOT 1.1 07/09/2022 1526      Lab Results  Component Value Date   WBC 9.1 07/30/2022   HGB 13.3 07/30/2022   HCT 40.4 07/30/2022   MCV 96 07/30/2022   PLT 289 07/30/2022   Lab Results  Component Value Date   TSH 5.333 (H) 03/06/2022      Total time spent on today's visit was ***60 minutes, including both face-to-face time and nonface-to-face time.  Time included that spent on review of records (prior notes available to me/labs/imaging if pertinent), discussing treatment and goals, answering patient's questions and coordinating care.  CC:  Benita Stabile, MD

## 2022-08-16 ENCOUNTER — Ambulatory Visit (INDEPENDENT_AMBULATORY_CARE_PROVIDER_SITE_OTHER): Payer: Medicare HMO | Admitting: Neurology

## 2022-08-16 ENCOUNTER — Encounter: Payer: Self-pay | Admitting: Neurology

## 2022-08-16 ENCOUNTER — Telehealth: Payer: Self-pay

## 2022-08-16 ENCOUNTER — Ambulatory Visit (HOSPITAL_COMMUNITY)
Admission: RE | Admit: 2022-08-16 | Discharge: 2022-08-16 | Disposition: A | Discharge status: Home / Self Care | Payer: Medicare HMO | Source: Ambulatory Visit | Attending: Family Medicine | Admitting: Family Medicine

## 2022-08-16 VITALS — BP 170/70 | HR 59 | Ht 62.0 in | Wt 95.6 lb

## 2022-08-16 DIAGNOSIS — R270 Ataxia, unspecified: Secondary | ICD-10-CM | POA: Diagnosis present

## 2022-08-16 DIAGNOSIS — W19XXXA Unspecified fall, initial encounter: Secondary | ICD-10-CM

## 2022-08-16 DIAGNOSIS — S0990XA Unspecified injury of head, initial encounter: Secondary | ICD-10-CM | POA: Diagnosis not present

## 2022-08-16 DIAGNOSIS — R918 Other nonspecific abnormal finding of lung field: Secondary | ICD-10-CM | POA: Diagnosis not present

## 2022-08-16 DIAGNOSIS — I2510 Atherosclerotic heart disease of native coronary artery without angina pectoris: Secondary | ICD-10-CM | POA: Diagnosis not present

## 2022-08-16 DIAGNOSIS — J439 Emphysema, unspecified: Secondary | ICD-10-CM | POA: Diagnosis not present

## 2022-08-16 DIAGNOSIS — J811 Chronic pulmonary edema: Secondary | ICD-10-CM | POA: Insufficient documentation

## 2022-08-16 DIAGNOSIS — T887XXA Unspecified adverse effect of drug or medicament, initial encounter: Secondary | ICD-10-CM

## 2022-08-16 DIAGNOSIS — D320 Benign neoplasm of cerebral meninges: Secondary | ICD-10-CM | POA: Insufficient documentation

## 2022-08-16 DIAGNOSIS — K449 Diaphragmatic hernia without obstruction or gangrene: Secondary | ICD-10-CM | POA: Insufficient documentation

## 2022-08-16 DIAGNOSIS — Y92009 Unspecified place in unspecified non-institutional (private) residence as the place of occurrence of the external cause: Secondary | ICD-10-CM | POA: Diagnosis not present

## 2022-08-16 DIAGNOSIS — I700 Atherosclerosis of aorta: Secondary | ICD-10-CM | POA: Diagnosis not present

## 2022-08-16 DIAGNOSIS — S0003XA Contusion of scalp, initial encounter: Secondary | ICD-10-CM | POA: Insufficient documentation

## 2022-08-16 DIAGNOSIS — J90 Pleural effusion, not elsewhere classified: Secondary | ICD-10-CM | POA: Insufficient documentation

## 2022-08-16 DIAGNOSIS — G250 Essential tremor: Secondary | ICD-10-CM

## 2022-08-16 DIAGNOSIS — I517 Cardiomegaly: Secondary | ICD-10-CM | POA: Diagnosis not present

## 2022-08-16 NOTE — Telephone Encounter (Signed)
Approval Auth # [id number] Valid Aug 16 2022-Nov 2024

## 2022-08-17 ENCOUNTER — Telehealth: Payer: Self-pay | Admitting: Neurology

## 2022-08-17 ENCOUNTER — Other Ambulatory Visit: Payer: Self-pay

## 2022-08-17 DIAGNOSIS — D320 Benign neoplasm of cerebral meninges: Secondary | ICD-10-CM

## 2022-08-17 NOTE — Telephone Encounter (Signed)
Pt called in and left a message. She is wanting to get her CAT Scan results

## 2022-08-17 NOTE — Telephone Encounter (Signed)
Called patient and she would prefer the [facility] location for [place] [phone]

## 2022-08-20 ENCOUNTER — Telehealth: Payer: Self-pay | Admitting: Anesthesiology

## 2022-08-20 ENCOUNTER — Ambulatory Visit (HOSPITAL_COMMUNITY)
Admission: RE | Admit: 2022-08-20 | Discharge: 2022-08-20 | Disposition: A | Discharge status: Home / Self Care | Payer: Medicare HMO | Source: Ambulatory Visit | Attending: Surgery | Admitting: Surgery

## 2022-08-20 ENCOUNTER — Encounter: Payer: Self-pay | Admitting: Surgery

## 2022-08-20 ENCOUNTER — Ambulatory Visit (INDEPENDENT_AMBULATORY_CARE_PROVIDER_SITE_OTHER)
Admission: RE | Admit: 2022-08-20 | Discharge: 2022-08-20 | Disposition: A | Discharge status: Home / Self Care | Payer: Medicare HMO | Source: Ambulatory Visit | Attending: Surgery | Admitting: Surgery

## 2022-08-20 ENCOUNTER — Ambulatory Visit (INDEPENDENT_AMBULATORY_CARE_PROVIDER_SITE_OTHER): Payer: Medicare HMO | Admitting: Surgery

## 2022-08-20 VITALS — BP 197/81 | HR 62 | Temp 97.9°F | Resp 20 | Ht 62.0 in | Wt 95.0 lb

## 2022-08-20 DIAGNOSIS — I70223 Atherosclerosis of native arteries of extremities with rest pain, bilateral legs: Secondary | ICD-10-CM | POA: Insufficient documentation

## 2022-08-20 DIAGNOSIS — I739 Peripheral vascular disease, unspecified: Secondary | ICD-10-CM

## 2022-08-20 DIAGNOSIS — I7025 Atherosclerosis of native arteries of other extremities with ulceration: Secondary | ICD-10-CM | POA: Diagnosis not present

## 2022-08-20 LAB — VAS US ABI WITH/WO TBI
Left ABI: 1.01
Right ABI: 1.02

## 2022-08-20 NOTE — H&P (View-Only) (Signed)
 [facility] [place]  Patient name: [name] MRN: [id number] DOB: 08/14/1940 Sex: female   REASON FOR VISIT:    Follow up  HISOTRY OF PRESENT ILLNESS:    [name] is a 82 y.o. female who underwent left superficial femoral artery stenting and drug-coated balloon angioplasty of the left popliteal artery and angioplasty of the left peroneal artery on 12/19/2021 for left foot ulcer.  12/29/2021, Dr. Patel performed left great toe amputation for osteomyelitis, as well as foot debridement she has had a persistent wound on the left lateral malleolus.  This does appear to be getting better but still persists.    Patient has a history of carotid stenosis, followed by Dr. Nishan.  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  She is asymptomatic.  She takes a statin for hypercholesterolemia.  She is on Eliquis and Plavix as well.  She is medically managed for hypertension.    She recently had CT scans that showed a calcified meningioma on the left occipital lobe as well as a cavitary left lower lobe nodule.  PAST MEDICAL HISTORY:   Past Medical History:  Diagnosis Date   Anemia 05/2014.   Atrial fibrillation (HCC)    x 3 yrs   Breast cancer (HCC)    S/P left mastectomy and chemotherapy 1989 remained in remission   Carotid artery occlusion    Carotid bruit    LICA 60-79% (duplex 9/11)   Degenerative arthritis of spine 2015   Chronic back pain   Hyperlipidemia    Hypertension    Meningioma (HCC)    Osteoporosis    Pneumonia May 19, 2014   Stroke (HCC)    CVA 2008   Subclavian steal syndrome    Ulcers of both lower extremities (HCC) 2015     FAMILY HISTORY:   Family History  Problem Relation Age of Onset   Alzheimer's disease Mother    Dementia Mother    Hypertension Mother    Hyperlipidemia Mother    Parkinsonism Father     SOCIAL HISTORY:   Social History   Tobacco Use    Smoking status: Former    Packs/day: 1.00    Years: 56.00    Additional pack years: 0.00    Total pack years: 56.00    Types: Cigarettes    Quit date: 08/14/2006    Years since quitting: 16.0    Passive exposure: Never   Smokeless tobacco: Never   Tobacco comments:    quit in 2008  Substance Use Topics   Alcohol use: No    Alcohol/week: 0.0 standard drinks of alcohol     ALLERGIES:   Allergies  Allergen Reactions   Iodine Rash and Other (See Comments)    BETADINE Rash/burning, blisters on skin. Burns skin   Iohexol Rash and Other (See Comments)    Blisters; PT NEEDS 13-HOUR PREP    Sulfa Antibiotics Nausea And Vomiting   Petroleum Gauze Non-Woven 3x9" [Wound Dressings] Other (See Comments)    BURNING SENSATION, RED SKIN   Povidone-Iodine Other (See Comments)    Burns skin   Wound Dressing Adhesive Other (See Comments)    Burns skin   Tape Itching, Rash and Other (See Comments)    "DO NOT USE ADHESIVE TAPE" Not even a Band Aid" NO PAPER TAPE Burns skin Skin is very very thin     CURRENT MEDICATIONS:   Current Outpatient Medications  Medication Sig Dispense Refill   amiodarone (PACERONE)   200 MG tablet Take 1 tablet (200 mg total) by mouth 2 (two) times daily. 180 tablet 3   apixaban (ELIQUIS) 2.5 MG TABS tablet Take 1 tablet (2.5 mg total) by mouth 2 (two) times daily. 180 tablet 1   ascorbic acid (VITAMIN C) 500 MG tablet Take 500 mg by mouth in the morning.     CALCIUM PO Take 1,200 mg by mouth 2 (two) times daily.     Camphor-Menthol-Methyl Sal (SALONPAS) 3.04-14-08 % PTCH Place 1 patch onto the skin daily as needed (pain.).     Cholecalciferol (VITAMIN D-3 PO) Take 1-2 tablets by mouth See admin instructions. Take 1 tablet by mouth in the morning & take 2 tablets by mouth at night.     clopidogrel (PLAVIX) 75 MG tablet TAKE 1 TABLET EVERY MORNING 90 tablet 3   denosumab (PROLIA) 60 MG/ML SOSY injection Inject 60 mg into the skin every 6 (six) months.     Ferrous  Sulfate (IRON PO) Take 325 mg by mouth at bedtime.     furosemide (LASIX) 20 MG tablet Take 1 tablet (20 mg total) by mouth 2 (two) times daily. (Patient taking differently: Take 20 mg by mouth in the morning.) 30 tablet 0   MAGNESIUM OXIDE PO Take 500 mg by mouth every morning.     metoprolol tartrate (LOPRESSOR) 25 MG tablet TAKE 3 TABLETS TWICE A DAY 540 tablet 3   Multiple Vitamin (MULTIVITAMIN WITH MINERALS) TABS tablet Take 1 tablet by mouth every morning.     pantoprazole (PROTONIX) 40 MG tablet TAKE 1 TABLET DAILY 90 tablet 3   potassium chloride (KLOR-CON M) 10 MEQ tablet Take 1 tablet (10 mEq total) by mouth daily. 30 tablet 2   pravastatin (PRAVACHOL) 40 MG tablet Take 1 tablet (40 mg total) by mouth daily. (Patient taking differently: Take 40 mg by mouth at bedtime.) 90 tablet 3   SANTYL 250 UNIT/GM ointment APPLY TO AFFECTED AREAS ONCE DAILY. (Patient taking differently: 1 Application daily.) 30 g 0   umeclidinium bromide (INCRUSE ELLIPTA) 62.5 MCG/ACT AEPB Inhale 1 puff into the lungs daily. 90 each 3   No current facility-administered medications for this visit.    REVIEW OF SYSTEMS:   [X] denotes positive finding, [ ] denotes negative finding Cardiac  Comments:  Chest pain or chest pressure:    Shortness of breath upon exertion:    Short of breath when lying flat:    Irregular heart rhythm:        Vascular    Pain in calf, thigh, or hip brought on by ambulation:    Pain in feet at night that wakes you up from your sleep:     Blood clot in your veins:    Leg swelling:         Pulmonary    Oxygen at home:    Productive cough:     Wheezing:         Neurologic    Sudden weakness in arms or legs:     Sudden numbness in arms or legs:     Sudden onset of difficulty speaking or slurred speech:    Temporary loss of vision in one eye:     Problems with dizziness:         Gastrointestinal    Blood in stool:     Vomited blood:         Genitourinary    Burning when  urinating:     Blood in urine:          Psychiatric    Major depression:         Hematologic    Bleeding problems:    Problems with blood clotting too easily:        Skin    Rashes or ulcers:        Constitutional    Fever or chills:      PHYSICAL EXAM:   Vitals:   08/20/22 1138  BP: (!) 197/81  Pulse: 62  Resp: 20  Temp: 97.9 F (36.6 C)  SpO2: 92%  Weight: 95 lb (43.1 kg)  Height: 5' 2" (1.575 m)    GENERAL: The patient is a well-nourished female, in no acute distress. The vital signs are documented above. CARDIAC: There is a regular rate and rhythm.  PULMONARY: Non-labored respirations MUSCULOSKELETAL: There are no major deformities or cyanosis. NEUROLOGIC: No focal weakness or paresthesias are detected. SKIN: There are no ulcers or rashes noted. PSYCHIATRIC: The patient has a normal affect.  STUDIES:   I have reviewed the following: ABI/TBIToday's ABIToday's TBIPrevious ABIPrevious TBI  +-------+-----------+-----------+------------+------------+  Right 1.02       0.57       0.73        0.82          +-------+-----------+-----------+------------+------------+  Left  1.01       amputated  1.01        Amputated     +-------+-----------+-----------+------------+------------+  Right toe pressure: 104 Left toe pressure:  Left: 75 - 99% TP Trunk stenosis.  Velocity, 553 cm/s Patent SFA stent with no visualized stenosis.    MEDICAL ISSUES:   Atherosclerotic lower extremity arterial vascular disease with ulceration on the left lateral malleolus.  The patient is previously undergone intervention.  She has a persistent wound.  Ultrasound today showed a high-grade stenosis with velocities of 553 cm/s and the tibioperoneal trunk which has previously been treated.  I discussed that I would plan on proceeding with intervention via right femoral approach.  I would likely perform atherectomy of the tibioperoneal trunk and subsequent angioplasty to improve  her chances of having this heal.  I am scheduling this on Tuesday, May 21    Wells Thomos Domine, IV, MD, FACS [facility]  [place] [phone] Pager [phone]  

## 2022-08-20 NOTE — Telephone Encounter (Signed)
Called pateint and left voicemail  

## 2022-08-20 NOTE — Patient Instructions (Signed)
ICD-10-CM   1. COPD, moderate (HCC)  J44.9   2. Vaccine counseling  Z71.89      COPD, moderate (HCC)  -Stable COPD  Plan -Continue Spiriva Respimat daily with albuterol as needed  -Take 2 samples of Spiriva Respimat from our office  -In the future if having financial difficulties with Spiriva please call us and we can try to get you a sample temporarily or we can try to change you to DuoNeb through Medicare part B  Vaccine counseling  -Recommend high-dose flu shot when possible [we do not have stock in office] -Also recommend COVID-19 booster third shot with caveat full FDA review pending but emerging evidence suggests elderly high risk most to benefit from covid booster  Follow-up -12 months or sooner if needed follow-up moderate COPD.   

## 2022-08-20 NOTE — Telephone Encounter (Signed)
Patient will also need a something to calm her down even with the open MRI patient extremely Claustrophic

## 2022-08-20 NOTE — Progress Notes (Signed)
[facility]   Patient name: [name] MRN: [id number] DOB: [birth year] Sex: female   REASON FOR VISIT:    Follow up  HISOTRY OF PRESENT ILLNESS:    [name] is a 82 y.o. female who underwent left superficial femoral artery stenting and drug-coated balloon angioplasty of the left popliteal artery and angioplasty of the left peroneal artery on 12/19/2021 for left foot ulcer.  12/29/2021, Dr. Allena Katz performed left great toe amputation for osteomyelitis, as well as foot debridement she has had a persistent wound on the left lateral malleolus.  This does appear to be getting better but still persists.    Patient has a history of carotid stenosis, followed by Dr. Eden Emms.  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  She is asymptomatic.  She takes a statin for hypercholesterolemia.  She is on Eliquis and Plavix as well.  She is medically managed for hypertension.    She recently had CT scans that showed a calcified meningioma on the left occipital lobe as well as a cavitary left lower lobe nodule.  PAST MEDICAL HISTORY:   Past Medical History:  Diagnosis Date   Anemia 05/2014.   Atrial fibrillation (HCC)    x 3 yrs   Breast cancer (HCC)    S/P left mastectomy and chemotherapy 1989 remained in remission   Carotid artery occlusion    Carotid bruit    LICA 60-79% (duplex 9/11)   Degenerative arthritis of spine 2015   Chronic back pain   Hyperlipidemia    Hypertension    Meningioma (HCC)    Osteoporosis    Pneumonia May 19, 2014   Stroke [facility])    CVA 2008   Subclavian steal syndrome    Ulcers of both lower extremities (HCC) 2015     FAMILY HISTORY:   Family History  Problem Relation Age of Onset   Alzheimer's disease Mother    Dementia Mother    Hypertension Mother    Hyperlipidemia Mother    Parkinsonism Father     SOCIAL HISTORY:   Social History   Tobacco Use    Smoking status: Former    Packs/day: 1.00    Years: 56.00    Additional pack years: 0.00    Total pack years: 56.00    Types: Cigarettes    Quit date: 08/14/2006    Years since quitting: 16.0    Passive exposure: Never   Smokeless tobacco: Never   Tobacco comments:    quit in 2008  Substance Use Topics   Alcohol use: No    Alcohol/week: 0.0 standard drinks of alcohol     ALLERGIES:   Allergies  Allergen Reactions   Iodine Rash and Other (See Comments)    BETADINE Rash/burning, blisters on skin. Burns skin   Iohexol Rash and Other (See Comments)    Blisters; PT NEEDS 13-HOUR PREP    Sulfa Antibiotics Nausea And Vomiting   Petroleum Gauze Non-Woven 3x9" [Wound Dressings] Other (See Comments)    BURNING SENSATION, RED SKIN   Povidone-Iodine Other (See Comments)    Burns skin   Wound Dressing Adhesive Other (See Comments)    Burns skin   Tape Itching, Rash and Other (See Comments)    "DO NOT USE ADHESIVE TAPE" Not even a Band Aid" NO PAPER TAPE Burns skin Skin is very very thin     CURRENT MEDICATIONS:   Current Outpatient Medications  Medication Sig Dispense Refill   amiodarone (PACERONE)  200 MG tablet Take 1 tablet (200 mg total) by mouth 2 (two) times daily. 180 tablet 3   apixaban (ELIQUIS) 2.5 MG TABS tablet Take 1 tablet (2.5 mg total) by mouth 2 (two) times daily. 180 tablet 1   ascorbic acid (VITAMIN C) 500 MG tablet Take 500 mg by mouth in the morning.     CALCIUM PO Take 1,200 mg by mouth 2 (two) times daily.     Camphor-Menthol-Methyl Sal (SALONPAS) 3.04-14-08 % PTCH Place 1 patch onto the skin daily as needed (pain.).     Cholecalciferol (VITAMIN D-3 PO) Take 1-2 tablets by mouth See admin instructions. Take 1 tablet by mouth in the morning & take 2 tablets by mouth at night.     clopidogrel (PLAVIX) 75 MG tablet TAKE 1 TABLET EVERY MORNING 90 tablet 3   denosumab (PROLIA) 60 MG/ML SOSY injection Inject 60 mg into the skin every 6 (six) months.     Ferrous  Sulfate (IRON PO) Take 325 mg by mouth at bedtime.     furosemide (LASIX) 20 MG tablet Take 1 tablet (20 mg total) by mouth 2 (two) times daily. (Patient taking differently: Take 20 mg by mouth in the morning.) 30 tablet 0   MAGNESIUM OXIDE PO Take 500 mg by mouth every morning.     metoprolol tartrate (LOPRESSOR) 25 MG tablet TAKE 3 TABLETS TWICE A DAY 540 tablet 3   Multiple Vitamin (MULTIVITAMIN WITH MINERALS) TABS tablet Take 1 tablet by mouth every morning.     pantoprazole (PROTONIX) 40 MG tablet TAKE 1 TABLET DAILY 90 tablet 3   potassium chloride (KLOR-CON M) 10 MEQ tablet Take 1 tablet (10 mEq total) by mouth daily. 30 tablet 2   pravastatin (PRAVACHOL) 40 MG tablet Take 1 tablet (40 mg total) by mouth daily. (Patient taking differently: Take 40 mg by mouth at bedtime.) 90 tablet 3   SANTYL 250 UNIT/GM ointment APPLY TO AFFECTED AREAS ONCE DAILY. (Patient taking differently: 1 Application daily.) 30 g 0   umeclidinium bromide (INCRUSE ELLIPTA) 62.5 MCG/ACT AEPB Inhale 1 puff into the lungs daily. 90 each 3   No current facility-administered medications for this visit.    REVIEW OF SYSTEMS:   [X]  denotes positive finding, [ ]  denotes negative finding Cardiac  Comments:  Chest pain or chest pressure:    Shortness of breath upon exertion:    Short of breath when lying flat:    Irregular heart rhythm:        Vascular    Pain in calf, thigh, or hip brought on by ambulation:    Pain in feet at night that wakes you up from your sleep:     Blood clot in your veins:    Leg swelling:         Pulmonary    Oxygen at home:    Productive cough:     Wheezing:         Neurologic    Sudden weakness in arms or legs:     Sudden numbness in arms or legs:     Sudden onset of difficulty speaking or slurred speech:    Temporary loss of vision in one eye:     Problems with dizziness:         Gastrointestinal    Blood in stool:     Vomited blood:         Genitourinary    Burning when  urinating:     Blood in urine:  Psychiatric    Major depression:         Hematologic    Bleeding problems:    Problems with blood clotting too easily:        Skin    Rashes or ulcers:        Constitutional    Fever or chills:      PHYSICAL EXAM:   Vitals:   08/20/22 1138  BP: (!) 197/81  Pulse: 62  Resp: 20  Temp: 97.9 F (36.6 C)  SpO2: 92%  Weight: 95 lb (43.1 kg)  Height: 5\' 2"  (1.575 m)    GENERAL: The patient is a well-nourished female, in no acute distress. The vital signs are documented above. CARDIAC: There is a regular rate and rhythm.  PULMONARY: Non-labored respirations MUSCULOSKELETAL: There are no major deformities or cyanosis. NEUROLOGIC: No focal weakness or paresthesias are detected. SKIN: There are no ulcers or rashes noted. PSYCHIATRIC: The patient has a normal affect.  STUDIES:   I have reviewed the following: ABI/TBIToday's ABIToday's TBIPrevious ABIPrevious TBI  +-------+-----------+-----------+------------+------------+  Right 1.02       0.57       0.73        0.82          +-------+-----------+-----------+------------+------------+  Left  1.01       amputated  1.01        Amputated     +-------+-----------+-----------+------------+------------+  Right toe pressure: 104 Left toe pressure:  Left: 75 - 99% TP Trunk stenosis.  Velocity, 553 cm/s Patent SFA stent with no visualized stenosis.    MEDICAL ISSUES:   Atherosclerotic lower extremity arterial vascular disease with ulceration on the left lateral malleolus.  The patient is previously undergone intervention.  She has a persistent wound.  Ultrasound today showed a high-grade stenosis with velocities of 553 cm/s and the tibioperoneal trunk which has previously been treated.  I discussed that I would plan on proceeding with intervention via right femoral approach.  I would likely perform atherectomy of the tibioperoneal trunk and subsequent angioplasty to improve  her chances of having this heal.  I am scheduling this on Tuesday, May 21    Charlena Cross, MD, FACS [facility] [phone] Pager [phone]

## 2022-08-20 NOTE — Progress Notes (Unsigned)
12/22/2019: OV with Dr. Marchelle Gearing.  Doing well on Spiriva; however concerned that it can be quite expensive when she runs into the donut hole.  She was provided with 2 samples and advised to call in the future if she had any issues with affordability.  Discussed getting third COVID booster, which she was encouraged to do so given her long history.  10/25/2021: OV with Cobb NP for overdue follow-up.  She reports that she has been feeling well.  She has no concerns or complaints regarding her breathing.  She really does not notice any significant shortness of breath.  No limits in her daily activities.  Her husband reports that she walks laps around him at the grocery store.  She is on Spiriva daily, which works well for her.  She never uses her albuterol inhaler.  She has not required prednisone or antibiotics for acute exacerbations of her breathing.  04/27/2022: Today - follow up Patient presents today for 45-month follow-up.  She was recently admitted 03/05/2022 to the hospital for sepsis secondary to left foot cellulitis.  She was treated with IV vancomycin and ceftriaxone and transition to doxycycline and Augmentin to complete a 10-day course.  She was discharged home on 03/09/2022 after clinically improving.  Today, she tells me that she is feeling much better.  Feels like her breathing is at her baseline.  No significant concerns or complaints.  She does not have any cough, associated wheezing, or chest congestion.  Her insurance notified her that they will no longer be covering her Spiriva.  She needs an alternative inhaler for this.  Rare use of albuterol.    OV 08/20/2022  Subjective:  Patient ID: [name], female , DOB: [birth year] , age 82 y.o. , MRN: [id number] , ADDRESS: [id number] [place] [address] PCP Benita Stabile, MD Patient Care Team: Benita Stabile, MD as PCP - General (Internal Medicine) Wendall Stade, MD as PCP - Cardiology (Cardiology) Wendall Stade, MD  (Cardiology) Darreld Mclean, MD as Consulting Physician (Orthopedic Surgery) Kellie Shropshire, PT (Inactive) as Physical Therapist (Physical Therapy)  This Provider for this visit: Treatment Team:  Attending Provider: Kalman Shan, MD    08/20/2022 -  No chief complaint on file.    HPI [name] 82 y.o. -    CT Chest data  VAS [place] ABI WITH/WO TBI  Result Date: 08/20/2022  LOWER EXTREMITY DOPPLER STUDY Patient Name:  [name]  Date of Exam:   08/20/2022 Medical Rec #: [id number]     Accession #:    [id number] Date of Birth: 1940-10-11     Patient Gender: F Patient Age:   82 years Exam Location:  [facility] Procedure:      VAS [place] ABI WITH/WO TBI Referring Phys: Coral Else --------------------------------------------------------------------------------  Indications: Peripheral artery disease. High Risk Factors: Hypertension, hyperlipidemia, coronary artery disease.  Vascular Interventions: 12/08/2021 Stent left SFA. Drug coated balloon                         antioplasty, left popliteal and left peroneal artery. Performing Technologist: [name] RVT  Examination Guidelines: A complete evaluation includes at minimum, Doppler waveform signals and systolic blood pressure reading at the level of bilateral brachial, anterior tibial, and posterior tibial arteries, when vessel segments are accessible. Bilateral testing is considered an integral part of a complete examination. Photoelectric Plethysmograph (PPG) waveforms and toe systolic pressure readings are included  as required and additional duplex testing as needed. Limited examinations for reoccurring indications may be performed as noted.  ABI Findings: +---------+------------------+-----+----------+--------+ Right    Rt Pressure (mmHg)IndexWaveform  Comment  +---------+------------------+-----+----------+--------+ Brachial 156                                        +---------+------------------+-----+----------+--------+ PTA      185               1.02 monophasic         +---------+------------------+-----+----------+--------+ DP       115               0.64 biphasic           +---------+------------------+-----+----------+--------+ Great Toe104               0.57 Abnormal           +---------+------------------+-----+----------+--------+ +---------+------------------+-----+-------------------+----------+ Left     Lt Pressure (mmHg)IndexWaveform           Comment    +---------+------------------+-----+-------------------+----------+ Brachial 181                                                  +---------+------------------+-----+-------------------+----------+ PTA      182               1.01 dampened monophasic           +---------+------------------+-----+-------------------+----------+ DP       174               0.96 biphasic                      +---------+------------------+-----+-------------------+----------+ Great Toe                                          Ammputated +---------+------------------+-----+-------------------+----------+ +-------+-----------+-----------+------------+------------+ ABI/TBIToday's ABIToday's TBIPrevious ABIPrevious TBI +-------+-----------+-----------+------------+------------+ Right  1.02       0.57       0.73        0.82         +-------+-----------+-----------+------------+------------+ Left   1.01       amputated  1.01        Amputated    +-------+-----------+-----------+------------+------------+  Right ABIs appear increased. Left ABIs appear essentially unchanged. Right TBI decreased.  Summary: Right: Resting right ankle-brachial index is within normal range. The right toe-brachial index is abnormal. Left: Resting left ankle-brachial index is within normal range. *See table(s) above for measurements and observations.  Electronically signed by Coral Else MD on  08/20/2022 at 12:19:03 PM.    Final    VAS [place] LOWER EXTREMITY ARTERIAL DUPLEX  Result Date: 08/20/2022 LOWER EXTREMITY ARTERIAL DUPLEX STUDY Patient Name:  [name]  Date of Exam:   08/20/2022 Medical Rec #: [id number]     Accession #:    [id number] Date of Birth: 1940-04-24     Patient Gender: F Patient Age:   34 years Exam Location:  [facility] Procedure:      VAS [place] LOWER EXTREMITY ARTERIAL DUPLEX Referring Phys: Coral Else --------------------------------------------------------------------------------  Indications: Peripheral artery disease. High Risk Factors: Hypertension, hyperlipidemia, past history  of smoking,                    coronary artery disease.  Vascular Interventions: 12/19/21: Stent, left superficial femoral artery.                         Drug-coated balloon angioplasty, left popliteal artery.                         Angioplasty, left peroneal artery. Current ABI:            Right ABI 1.02 Left 1.01 Comparison       Increased velocity Tibialperoneal trunk since last exam Study:           07/23/2022. Performing Technologist: [name] RVT  Examination Guidelines: A complete evaluation includes B-mode imaging, spectral Doppler, color Doppler, and power Doppler as needed of all accessible portions of each vessel. Bilateral testing is considered an integral part of a complete examination. Limited examinations for reoccurring indications may be performed as noted.   +-----------+--------+-----+---------------+----------+--------+ LEFT       PSV cm/sRatioStenosis       Waveform  Comments +-----------+--------+-----+---------------+----------+--------+ EIA Distal 105                         biphasic           +-----------+--------+-----+---------------+----------+--------+ CFA Prox   126                         biphasic           +-----------+--------+-----+---------------+----------+--------+ CFA Mid                                          Stent     +-----------+--------+-----+---------------+----------+--------+ CFA Distal                                       stent    +-----------+--------+-----+---------------+----------+--------+ DFA                                              stent    +-----------+--------+-----+---------------+----------+--------+ SFA Prox                                         stent    +-----------+--------+-----+---------------+----------+--------+ SFA Mid                                          Stent    +-----------+--------+-----+---------------+----------+--------+ SFA Distal                                       Stent    +-----------+--------+-----+---------------+----------+--------+ POP Prox   89  triphasic          +-----------+--------+-----+---------------+----------+--------+ POP Mid    117                         biphasic           +-----------+--------+-----+---------------+----------+--------+ POP Distal 91                          triphasic stenotic +-----------+--------+-----+---------------+----------+--------+ TP Trunk   553          75-99% stenosis                   +-----------+--------+-----+---------------+----------+--------+ ATA Distal 31                          monophasic         +-----------+--------+-----+---------------+----------+--------+ PTA Distal 36                          monophasic         +-----------+--------+-----+---------------+----------+--------+ PERO Distal39                          monophasic         +-----------+--------+-----+---------------+----------+--------+  Left Stent(s): SFA +---------------+--++--------++ Prox to Stent  50biphasic +---------------+--++--------++ Proximal Stent 84biphasic +---------------+--++--------++ Mid Stent      52biphasic +---------------+--++--------++ Distal Stent   52biphasic +---------------+--++--------++ Distal to  Stent97biphasic +---------------+--++--------++    Summary: Left: 75 - 99% TP Trunk stenosis. Patent SFA stent with no visualized stenosis.  See table(s) above for measurements and observations. Electronically signed by Coral Else MD on 08/20/2022 at 12:18:11 PM.    Final       PFT     Latest Ref Rng & Units 07/08/2015    7:53 AM  PFT Results  FVC-Pre L 2.09   FVC-Predicted Pre % 76   FVC-Post L 1.96   FVC-Predicted Post % 71   Pre FEV1/FVC % % 69   Post FEV1/FCV % % 67   FEV1-Pre L 1.44   FEV1-Predicted Pre % 70   FEV1-Post L 1.31   DLCO uncorrected ml/min/mmHg 11.33   DLCO UNC% % 46   DLVA Predicted % 73   TLC L 4.96   TLC % Predicted % 98   RV % Predicted % 135        has a past medical history of Anemia (05/2014.), Atrial fibrillation (HCC), Breast cancer (HCC), Carotid artery occlusion, Carotid bruit, Degenerative arthritis of spine (2015), Hyperlipidemia, Hypertension, Meningioma (HCC), Osteoporosis, Pneumonia (May 19, 2014), Stroke [facility]), Subclavian steal syndrome, and Ulcers of both lower extremities (HCC) (2015).   reports that she quit smoking about 16 years ago. Her smoking use included cigarettes. She has a 56.00 pack-year smoking history. She has never been exposed to tobacco smoke. She has never used smokeless tobacco.  Past Surgical History:  Procedure Laterality Date   ABDOMINAL AORTOGRAM W/LOWER EXTREMITY N/A 12/19/2021   Procedure: ABDOMINAL AORTOGRAM W/LOWER EXTREMITY;  Surgeon: Nada Libman, MD;  Location: [facility];  Service: Cardiovascular;  Laterality: N/A;   AMPUTATION TOE Left 12/29/2021   Procedure: Incisional DEBRIDEMENT Left Foot with Amputation Left Great Toe;  Surgeon: Candelaria Stagers, DPM;  Location: [facility] OR;  Service: Podiatry;  Laterality: Left;   CARDIAC CATHETERIZATION     CATARACT EXTRACTION Bilateral 2013  COLONOSCOPY  11/17/2010   Procedure: COLONOSCOPY;  Surgeon: Malissa Hippo, MD;  Location: [facility];  Service:  Endoscopy;  Laterality: N/A;  10:45 am   ESOPHAGOGASTRODUODENOSCOPY  11/17/2010   Procedure: ESOPHAGOGASTRODUODENOSCOPY (EGD);  Surgeon: Malissa Hippo, MD;  Location: [facility];  Service: Endoscopy;  Laterality: N/A;   ESOPHAGOGASTRODUODENOSCOPY N/A 02/16/2015   Procedure: ESOPHAGOGASTRODUODENOSCOPY (EGD);  Surgeon: Ruffin Frederick, MD;  [facility];  Service: Gastroenterology;  Laterality: N/A;   EXPLORATORY LAPAROTOMY  1960s   For peritonitis of undetermined cause   EYE SURGERY     LUMBAR EPIDURAL INJECTION  06-2012--06-2013   pt. states she has had 5 epidurals in 06-2012----06-2013   MASTECTOMY Left 1990   PERIPHERAL VASCULAR INTERVENTION Left 12/19/2021   Procedure: PERIPHERAL VASCULAR INTERVENTION;  Surgeon: Nada Libman, MD;  Location: [facility];  Service: Cardiovascular;  Laterality: Left;  SFA and PTA of POP   YAG LASER APPLICATION Right 05/03/2015   Procedure: YAG LASER APPLICATION;  Surgeon: Jethro Bolus, MD;  Location: [facility];  Service: Ophthalmology;  Laterality: Right;  right   YAG LASER APPLICATION Left 05/24/2015   Procedure: YAG LASER APPLICATION;  Surgeon: Jethro Bolus, MD;  Location: [facility];  Service: Ophthalmology;  Laterality: Left;    Allergies  Allergen Reactions   Iodine Rash and Other (See Comments)    BETADINE Rash/burning, blisters on skin. Burns skin   Iohexol Rash and Other (See Comments)    Blisters; PT NEEDS 13-HOUR PREP    Sulfa Antibiotics Nausea And Vomiting   Petroleum Gauze Non-Woven 3x9" [Wound Dressings] Other (See Comments)    BURNING SENSATION, RED SKIN   Povidone-Iodine Other (See Comments)    Burns skin   Wound Dressing Adhesive Other (See Comments)    Burns skin   Tape Itching, Rash and Other (See Comments)    "DO NOT USE ADHESIVE TAPE" Not even a Band Aid" NO PAPER TAPE Burns skin Skin is very very thin    Immunization History  Administered Date(s) Administered   Influenza Split 01/07/2014    Influenza, High Dose Seasonal PF 01/28/2016, 01/07/2017, 12/17/2018, 01/16/2022   Influenza,inj,Quad PF,6+ Mos 01/12/2015   Influenza,inj,quad, With Preservative 12/16/2020   Influenza-Unspecified 01/07/2013, 01/01/2022   Moderna Sars-Covid-2 Vaccination 05/02/2019, 06/01/2019   Pneumococcal Conjugate-13 01/07/2014   Pneumococcal Polysaccharide-23 01/07/2017   Rsv, Bivalent, Protein Subunit Rsvpref,pf Verdis Frederickson) 03/23/2022   Tdap 08/01/2011   Zoster Recombinat (Shingrix) 01/10/2017    Family History  Problem Relation Age of Onset   Alzheimer's disease Mother    Dementia Mother    Hypertension Mother    Hyperlipidemia Mother    Parkinsonism Father      Current Outpatient Medications:    amiodarone (PACERONE) 200 MG tablet, Take 1 tablet (200 mg total) by mouth 2 (two) times daily., Disp: 180 tablet, Rfl: 3   apixaban (ELIQUIS) 2.5 MG TABS tablet, Take 1 tablet (2.5 mg total) by mouth 2 (two) times daily., Disp: 180 tablet, Rfl: 1   ascorbic acid (VITAMIN C) 500 MG tablet, Take 500 mg by mouth in the morning., Disp: , Rfl:    CALCIUM PO, Take 1,200 mg by mouth 2 (two) times daily., Disp: , Rfl:    Camphor-Menthol-Methyl Sal (SALONPAS) 3.04-14-08 % PTCH, Place 1 patch onto the skin daily as needed (pain.)., Disp: , Rfl:    Cholecalciferol (VITAMIN D-3 PO), Take 1-2 tablets by mouth See admin instructions. Take 1 tablet by mouth in the morning & take 2 tablets  by mouth at night., Disp: , Rfl:    clopidogrel (PLAVIX) 75 MG tablet, TAKE 1 TABLET EVERY MORNING, Disp: 90 tablet, Rfl: 3   denosumab (PROLIA) 60 MG/ML SOSY injection, Inject 60 mg into the skin every 6 (six) months., Disp: , Rfl:    Ferrous Sulfate (IRON PO), Take 325 mg by mouth at bedtime., Disp: , Rfl:    furosemide (LASIX) 20 MG tablet, Take 1 tablet (20 mg total) by mouth 2 (two) times daily. (Patient taking differently: Take 20 mg by mouth in the morning.), Disp: 30 tablet, Rfl: 0   MAGNESIUM OXIDE PO, Take 500 mg by mouth  every morning., Disp: , Rfl:    metoprolol tartrate (LOPRESSOR) 25 MG tablet, TAKE 3 TABLETS TWICE A DAY, Disp: 540 tablet, Rfl: 3   Multiple Vitamin (MULTIVITAMIN WITH MINERALS) TABS tablet, Take 1 tablet by mouth every morning., Disp: , Rfl:    pantoprazole (PROTONIX) 40 MG tablet, TAKE 1 TABLET DAILY, Disp: 90 tablet, Rfl: 3   potassium chloride (KLOR-CON M) 10 MEQ tablet, Take 1 tablet (10 mEq total) by mouth daily., Disp: 30 tablet, Rfl: 2   pravastatin (PRAVACHOL) 40 MG tablet, Take 1 tablet (40 mg total) by mouth daily. (Patient taking differently: Take 40 mg by mouth at bedtime.), Disp: 90 tablet, Rfl: 3   SANTYL 250 UNIT/GM ointment, APPLY TO AFFECTED AREAS ONCE DAILY. (Patient taking differently: 1 Application daily.), Disp: 30 g, Rfl: 0   umeclidinium bromide (INCRUSE ELLIPTA) 62.5 MCG/ACT AEPB, Inhale 1 puff into the lungs daily., Disp: 90 each, Rfl: 3      Objective:   There were no vitals filed for this visit.  Estimated body mass index is 17.38 kg/m as calculated from the following:   Height as of 08/20/22: 5\' 2"  (1.575 m).   Weight as of 08/20/22: 43.1 kg.  @WEIGHTCHANGE @  There were no vitals filed for this visit.   Physical Exam  General Appearance:    Alert, cooperative, no distress, appears stated age - *** , Deconditioned looking - *** , OBESE  - ***, Sitting on Wheelchair -  ***  Head:    Normocephalic, without obvious abnormality, atraumatic  Eyes:    PERRL, conjunctiva/corneas clear,  Ears:    Normal TM's and external ear canals, both ears  Nose:   Nares normal, septum midline, mucosa normal, no drainage    or sinus tenderness. OXYGEN ON  - *** . Patient is @ ***   Throat:   Lips, mucosa, and tongue normal; teeth and gums normal. Cyanosis on lips - ***  Neck:   Supple, symmetrical, trachea midline, no adenopathy;    thyroid:  no enlargement/tenderness/nodules; no carotid   bruit or JVD  Back:     Symmetric, no curvature, ROM normal, no CVA tenderness  Lungs:      Distress - *** , Wheeze ***, Barrell Chest - ***, Purse lip breathing - ***, Crackles - ***   Chest Wall:    No tenderness or deformity.    Heart:    Regular rate and rhythm, S1 and S2 normal, no rub   or gallop, Murmur - ***  Breast Exam:    NOT DONE  Abdomen:     Soft, non-tender, bowel sounds active all four quadrants,    no masses, no organomegaly. Visceral obesity - ***  Genitalia:   NOT DONE  Rectal:   NOT DONE  Extremities:   Extremities - normal, Has Cane - ***, Clubbing - ***, Edema - ***  Pulses:   2+ and symmetric all extremities  Skin:   Stigmata of Connective Tissue Disease - ***  Lymph nodes:   Cervical, supraclavicular, and axillary nodes normal  Psychiatric:  Neurologic:   Pleasant - ***, Anxious - ***, Flat affect - ***  CAm-ICU - neg, Alert and Oriented x 3 - yes, Moves all 4s - yes, Speech - normal, Cognition - intact    General: No distress. *** Neuro: Alert and Oriented x 3. GCS 15. Speech normal Psych: Pleasant Resp:  Barrel Chest - ***.  Wheeze - ***, Crackles - ***, No overt respiratory distress CVS: Normal heart sounds. Murmurs - *** Ext: Stigmata of Connective Tissue Disease - *** HEENT: Normal upper airway. PEERL +. No post nasal drip        Assessment:     No diagnosis found.     Plan:     Patient Instructions     ICD-10-CM   1. COPD, moderate (HCC)  J44.9   2. Vaccine counseling  Z71.89      COPD, moderate (HCC)  -Stable COPD  Plan -Continue Spiriva Respimat daily with albuterol as needed  -Take 2 samples of Spiriva Respimat from our office  -In the future if having financial difficulties with Spiriva please call us and we can try to get you a sample temporarily or we can try to change you to DuoNeb through Medicare part B  Vaccine counseling  -Recommend high-dose flu shot when possible [we do not have stock in office] -Also recommend COVID-19 booster third shot with caveat full FDA review pending but emerging evidence suggests  elderly high risk most to benefit from covid booster  Follow-up -12 months or sooner if needed follow-up moderate COPD.      SIGNATURE    Dr. Kalman Shan, M.D., F.C.C.P,  Pulmonary and Critical Care Medicine Staff Physician, [facility] Center Director - Interstitial Lung Disease  Program  P[facility] at [facility] [place], [place], [address]  Pager: [phone], If no answer or between  15:00h - 7:00h: call [phone] Telephone: [phone]  10:22 PM 08/20/2022

## 2022-08-20 NOTE — Telephone Encounter (Signed)
Pt left message stating she would like to have an open MRI, states can a Rx to help her calm her nerves be provided as well.

## 2022-08-21 ENCOUNTER — Encounter: Payer: Self-pay | Admitting: Internal Medicine

## 2022-08-21 ENCOUNTER — Other Ambulatory Visit: Payer: Self-pay

## 2022-08-21 ENCOUNTER — Ambulatory Visit (INDEPENDENT_AMBULATORY_CARE_PROVIDER_SITE_OTHER): Payer: Medicare HMO | Admitting: Internal Medicine

## 2022-08-21 VITALS — BP 100/60 | HR 79 | Ht 62.0 in | Wt 96.2 lb

## 2022-08-21 DIAGNOSIS — R911 Solitary pulmonary nodule: Secondary | ICD-10-CM | POA: Diagnosis not present

## 2022-08-21 DIAGNOSIS — Z01811 Encounter for preprocedural respiratory examination: Secondary | ICD-10-CM | POA: Diagnosis not present

## 2022-08-21 DIAGNOSIS — I7025 Atherosclerosis of native arteries of other extremities with ulceration: Secondary | ICD-10-CM

## 2022-08-21 DIAGNOSIS — J449 Chronic obstructive pulmonary disease, unspecified: Secondary | ICD-10-CM

## 2022-08-21 NOTE — Telephone Encounter (Signed)
Pt called back in and left a message. She is returning [name]'s call

## 2022-08-21 NOTE — Telephone Encounter (Signed)
Gave patient Dr. Iona Beard recommendations regarding the valium and decreased lung function. Patient on her way to meet with the pulmonologist now

## 2022-08-22 ENCOUNTER — Telehealth: Payer: Self-pay | Admitting: Internal Medicine

## 2022-08-22 NOTE — Telephone Encounter (Signed)
Called to let Ms Branigan know she is ineligible for the NIGHTINGALE study and will not need to come in for the screening visit. Mr Finnicum is planning on calling  [facility] to proceed with scheduling her blood testing.

## 2022-08-23 MED ORDER — ALPRAZOLAM 0.25 MG PO TABS: 0.2500 mg | ORAL_TABLET | Freq: Every evening | ORAL | 0 refills | Status: DC | PRN

## 2022-08-23 NOTE — Telephone Encounter (Signed)
Pt left message stating Dr Marchelle Gearing told her she can take Xanax .25 mg. States she would like a Rx for it.

## 2022-08-23 NOTE — Telephone Encounter (Signed)
I spoke to patient before her appointment with the pulmonary specialist and this is the recommendation he gave her regarding her upcoming MRI and her COPD

## 2022-08-23 NOTE — Telephone Encounter (Signed)
Ok thanks 

## 2022-08-25 ENCOUNTER — Other Ambulatory Visit: Payer: Self-pay | Admitting: Cardiovascular Disease

## 2022-08-27 ENCOUNTER — Telehealth: Payer: Self-pay

## 2022-08-27 NOTE — Telephone Encounter (Signed)
Spoke with patient to update on arrival time of 0530 am for procedure on tomorrow, 5/21 at [facility]. She voiced understanding.

## 2022-08-27 NOTE — Telephone Encounter (Signed)
Patient verbalized understanding about pending auth status for an angiogram scheduled tomorrow 08/27/22.  She would like to proceed but says she will also have her husband call her insurance company.

## 2022-08-28 ENCOUNTER — Ambulatory Visit (HOSPITAL_COMMUNITY)
Admission: RE | Admit: 2022-08-28 | Discharge: 2022-08-28 | Disposition: A | Discharge status: Home / Self Care | Payer: Medicare HMO | Source: Ambulatory Visit | Attending: Surgery | Admitting: Surgery

## 2022-08-28 ENCOUNTER — Encounter (HOSPITAL_COMMUNITY): Payer: Self-pay | Admitting: Surgery

## 2022-08-28 ENCOUNTER — Encounter (HOSPITAL_COMMUNITY)
Admission: RE | Disposition: A | Discharge status: Home / Self Care | Payer: Self-pay | Source: Ambulatory Visit | Attending: Surgery

## 2022-08-28 DIAGNOSIS — Z7901 Long term (current) use of anticoagulants: Secondary | ICD-10-CM | POA: Diagnosis not present

## 2022-08-28 DIAGNOSIS — L97329 Non-pressure chronic ulcer of left ankle with unspecified severity: Secondary | ICD-10-CM | POA: Diagnosis not present

## 2022-08-28 DIAGNOSIS — I70245 Atherosclerosis of native arteries of left leg with ulceration of other part of foot: Secondary | ICD-10-CM | POA: Diagnosis not present

## 2022-08-28 DIAGNOSIS — Z87891 Personal history of nicotine dependence: Secondary | ICD-10-CM | POA: Diagnosis not present

## 2022-08-28 DIAGNOSIS — I70243 Atherosclerosis of native arteries of left leg with ulceration of ankle: Secondary | ICD-10-CM | POA: Insufficient documentation

## 2022-08-28 DIAGNOSIS — Z7902 Long term (current) use of antithrombotics/antiplatelets: Secondary | ICD-10-CM | POA: Insufficient documentation

## 2022-08-28 DIAGNOSIS — Z8619 Personal history of other infectious and parasitic diseases: Secondary | ICD-10-CM | POA: Insufficient documentation

## 2022-08-28 DIAGNOSIS — Z89412 Acquired absence of left great toe: Secondary | ICD-10-CM | POA: Insufficient documentation

## 2022-08-28 DIAGNOSIS — Z8249 Family history of ischemic heart disease and other diseases of the circulatory system: Secondary | ICD-10-CM | POA: Diagnosis not present

## 2022-08-28 DIAGNOSIS — I7025 Atherosclerosis of native arteries of other extremities with ulceration: Secondary | ICD-10-CM

## 2022-08-28 HISTORY — PX: PERIPHERAL VASCULAR BALLOON ANGIOPLASTY: CATH118281

## 2022-08-28 HISTORY — PX: ABDOMINAL AORTOGRAM W/LOWER EXTREMITY: CATH118223

## 2022-08-28 HISTORY — PX: PERIPHERAL VASCULAR ATHERECTOMY: CATH118256

## 2022-08-28 LAB — POCT I-STAT, CHEM 8
BUN: 35 mg/dL — ABNORMAL HIGH (ref 8–23)
Calcium, Ion: 1.27 mmol/L (ref 1.15–1.40)
Chloride: 100 mmol/L (ref 98–111)
Creatinine, Ser: 1.3 mg/dL — ABNORMAL HIGH (ref 0.44–1.00)
Glucose, Bld: 89 mg/dL (ref 70–99)
HCT: 36 % (ref 36.0–46.0)
Hemoglobin: 12.2 g/dL (ref 12.0–15.0)
Potassium: 4.5 mmol/L (ref 3.5–5.1)
Sodium: 138 mmol/L (ref 135–145)
TCO2: 26 mmol/L (ref 22–32)

## 2022-08-28 SURGERY — ABDOMINAL AORTOGRAM W/LOWER EXTREMITY
Anesthesia: LOCAL | Laterality: Bilateral

## 2022-08-28 MED ORDER — SODIUM CHLORIDE 0.9 % IV SOLN: 250.0000 mL | INTRAVENOUS | Status: DC | PRN

## 2022-08-28 MED ORDER — FENTANYL CITRATE (PF) 100 MCG/2ML IJ SOLN
INTRAMUSCULAR | Status: DC | PRN
  Administered 2022-08-28 (×2): 25 ug via INTRAVENOUS

## 2022-08-28 MED ORDER — MIDAZOLAM HCL 2 MG/2ML IJ SOLN
INTRAMUSCULAR | Status: AC
  Filled 2022-08-28: qty 2

## 2022-08-28 MED ORDER — HEPARIN (PORCINE) IN NACL 1000-0.9 UT/500ML-% IV SOLN
INTRAVENOUS | Status: DC | PRN
  Administered 2022-08-28 (×2): 500 mL

## 2022-08-28 MED ORDER — SODIUM CHLORIDE 0.9 % IV SOLN: INTRAVENOUS | Status: DC

## 2022-08-28 MED ORDER — ACETAMINOPHEN 325 MG PO TABS: 650.0000 mg | ORAL_TABLET | ORAL | Status: DC | PRN

## 2022-08-28 MED ORDER — ONDANSETRON HCL 4 MG/2ML IJ SOLN: 4.0000 mg | Freq: Four times a day (QID) | INTRAMUSCULAR | Status: DC | PRN

## 2022-08-28 MED ORDER — DIPHENHYDRAMINE HCL 50 MG/ML IJ SOLN
25.0000 mg | INTRAMUSCULAR | Status: AC
  Administered 2022-08-28: 25 mg via INTRAVENOUS
  Filled 2022-08-28: qty 1

## 2022-08-28 MED ORDER — MIDAZOLAM HCL 2 MG/2ML IJ SOLN
INTRAMUSCULAR | Status: DC | PRN
  Administered 2022-08-28 (×3): 1 mg via INTRAVENOUS

## 2022-08-28 MED ORDER — FENTANYL CITRATE (PF) 100 MCG/2ML IJ SOLN
INTRAMUSCULAR | Status: AC
  Filled 2022-08-28: qty 2

## 2022-08-28 MED ORDER — LIDOCAINE HCL (PF) 1 % IJ SOLN
INTRAMUSCULAR | Status: AC
  Filled 2022-08-28: qty 30

## 2022-08-28 MED ORDER — SODIUM CHLORIDE 0.9% FLUSH: 3.0000 mL | INTRAVENOUS | Status: DC | PRN

## 2022-08-28 MED ORDER — HEPARIN SODIUM (PORCINE) 1000 UNIT/ML IJ SOLN
INTRAMUSCULAR | Status: DC | PRN
  Administered 2022-08-28: 4500 [IU] via INTRAVENOUS

## 2022-08-28 MED ORDER — SODIUM CHLORIDE 0.9% FLUSH: 3.0000 mL | Freq: Two times a day (BID) | INTRAVENOUS | Status: DC

## 2022-08-28 MED ORDER — HYDRALAZINE HCL 20 MG/ML IJ SOLN
5.0000 mg | INTRAMUSCULAR | Status: DC | PRN
  Administered 2022-08-28: 5 mg via INTRAVENOUS
  Filled 2022-08-28: qty 1

## 2022-08-28 MED ORDER — IODIXANOL 320 MG/ML IV SOLN
INTRAVENOUS | Status: DC | PRN
  Administered 2022-08-28: 80 mL

## 2022-08-28 MED ORDER — METHYLPREDNISOLONE SODIUM SUCC 125 MG IJ SOLR
125.0000 mg | INTRAMUSCULAR | Status: AC
  Administered 2022-08-28: 125 mg via INTRAVENOUS
  Filled 2022-08-28: qty 2

## 2022-08-28 MED ORDER — LIDOCAINE HCL (PF) 1 % IJ SOLN
INTRAMUSCULAR | Status: DC | PRN
  Administered 2022-08-28: 10 mL

## 2022-08-28 SURGICAL SUPPLY — 17 items
BALLN STERLING OTW 3X40X150 (BALLOONS) ×2
BALLOON STERLING OTW 3X40X150 (BALLOONS) IMPLANT
CATH AURYON ATHERECTOMY 0.9 (CATHETERS) IMPLANT
CATH OMNI FLUSH 5F 65CM (CATHETERS) IMPLANT
DEVICE VASC CLSR CELT ART 5 (Vascular Products) IMPLANT
KIT MICROPUNCTURE NIT STIFF (SHEATH) IMPLANT
KIT PV (KITS) ×2 IMPLANT
SHEATH CATAPULT 5FR 60 (SHEATH) IMPLANT
SHEATH PINNACLE 5F 10CM (SHEATH) IMPLANT
SHEATH PROBE COVER 6X72 (BAG) IMPLANT
STOPCOCK MORSE 400PSI 3WAY (MISCELLANEOUS) IMPLANT
SYR MEDRAD MARK 7 150ML (SYRINGE) ×2 IMPLANT
TRANSDUCER W/STOPCOCK (MISCELLANEOUS) ×2 IMPLANT
TRAY PV CATH (CUSTOM PROCEDURE TRAY) ×2 IMPLANT
TUBING CIL FLEX 10 FLL-RA (TUBING) IMPLANT
WIRE SPARTACORE .014X300CM (WIRE) IMPLANT
WIRE STARTER BENTSON 035X150 (WIRE) IMPLANT

## 2022-08-28 NOTE — Discharge Instructions (Signed)
Restart eliquis tomorrow

## 2022-08-28 NOTE — Op Note (Signed)
Patient name: [name] MRN: [id number] DOB: [birth year] Sex: female  08/28/2022 Pre-operative Diagnosis: Left leg ulcer Post-operative diagnosis:  Same Surgeon:  [name] Procedure Performed:  1.  Ultrasound-guided access, right femoral artery  2.  Abdominal aortogram  3.  Bilateral lower extremity runoff  4.  Laser atherectomy with angioplasty, left popliteal artery  5.  Laser atherectomy with angioplasty, left anterior tibial artery  6.  Conscious sedation, 49 minutes  7.  Closure device, Celt   Indications: This is an 82 year old female who has previously undergone intervention and subsequent toe amputation.  She has residual ulcers on the left foot.  She comes in today for angiographic evaluation and intervention  Procedure:  The patient was identified in the holding area and taken to room 8.  The patient was then placed supine on the table and prepped and draped in the usual sterile fashion.  A time out was called.  Conscious sedation was administered with the use of IV fentanyl and Versed under continuous physician and nurse monitoring.  Heart rate, blood pressure, and oxygen saturation were continuously monitored.  Total sedation time was 49 minutes.  Ultrasound was used to evaluate the right common femoral artery.  It was patent .  A digital ultrasound image was acquired.  A micropuncture needle was used to access the right common femoral artery under ultrasound guidance.  An 018 wire was advanced without resistance and a micropuncture sheath was placed.  The 018 wire was removed and a benson wire was placed.  The micropuncture sheath was exchanged for a 5 french sheath.  An omniflush catheter was advanced over the wire to the level of L-1.  An abdominal angiogram was obtained.  Next, using the omniflush catheter and a benson wire, the aortic bifurcation was crossed and the catheter was placed into theleft external iliac artery and left runoff was obtained.  right runoff was  performed via retrograde sheath injections.  Findings:   Aortogram: No significant renal artery stenosis was visualized.  The infrarenal abdominal aorta is widely patent.  Bilateral common and external iliac arteries widely patent.  The visualized portions of the common femoral arteries are without significant stenosis.  There is heavily calcified plaque in the right common femoral artery  Right Lower Extremity: The right common femoral artery is calcified without significant stenosis.  The profunda and superficial femoral artery are patent with mild disease with no significant stenosis.  There is two-vessel runoff via the anterior tibial and peroneal artery.  There were several greater than 80% lesions within the anterior tibial artery  Left Lower Extremity: The left common femoral and profundofemoral artery are widely patent.  The stent within the superficial femoral and above-knee popliteal artery are widely patent without significant stenosis.  There is a high-grade, greater than 80% stenosis at the distal left popliteal artery.  There is two-vessel runoff via the anterior tibial and peroneal artery.  There is a 70% stenosis at the origin of the anterior tibial artery.  Intervention: After above images were acquired the decision made to proceed with intervention.  A 5 French 60 cm catapulted sheath was then inserted.  The patient was fully heparinized.  The sheath was in the superficial femoral artery for additional injections to better define the lesion.  I then used a 014 [place] core to cross the stenosis within the anterior tibial and popliteal artery.  I selected a 0.9 mm laser catheter to perform atherectomy of the popliteal artery and proximal anterior  tibial artery.  3 passes were made.  I then treated this with a 3 x 40 Sterling balloon for primary balloon angioplasty.  The balloon was taken to 12 atm for 2 minutes.  Completion imaging showed resolution of the stenosis with no change in the  runoff.  The long sheath was exchanged out for short 5 French sheath in the groin was closed with a Celt  Impression:  #1  Successful laser atherectomy and angioplasty using a 3 mm Sterling balloon of a 80% distal popliteal stenosis and 70% proximal anterior tibial artery stenosis on the left with no residual stenosis  #2  Several high-grade lesions within the right anterior tibial artery that are amenable to intervention.  #3  Patient will resume Eliquis tomorrow and continue her Plavix.  She has a aspirin allergy and so this was not ordered.   Juleen China, M.D., [facility] [place] Office: [phone] Pager:  [phone]

## 2022-08-28 NOTE — Interval H&P Note (Signed)
History and Physical Interval Note:  08/28/2022 7:58 AM  [name]  has presented today for surgery, with the diagnosis of atherosclerosis of native arteries of the extremities with ulceration.  The various methods of treatment have been discussed with the patient and family. After consideration of risks, benefits and other options for treatment, the patient has consented to  Procedure(s): ABDOMINAL AORTOGRAM W/LOWER EXTREMITY (N/A) as a surgical intervention.  The patient's history has been reviewed, patient examined, no change in status, stable for surgery.  I have reviewed the patient's chart and labs.  Questions were answered to the patient's satisfaction.     [name]

## 2022-08-29 ENCOUNTER — Telehealth: Payer: Self-pay | Admitting: Neurology

## 2022-08-29 LAB — POCT ACTIVATED CLOTTING TIME: Activated Clotting Time: 244 seconds

## 2022-08-29 NOTE — Telephone Encounter (Signed)
From her last visit  Essential Tremor.             -She is back on the amiodarone.  They tried her off of it, but she did not do well from a cardiac standpoint.  Unfortunately, she noted that amiodarone worsens her tremor.             -Unfortunately, she is still on Eliquis, so we cannot use primidone in her.             -She is already on a beta-blocker.  She is on metoprolol, 75 mg twice per day.             -I don't think that the 2nd line drugs are going to be impactful for her degree of tremor, and likely would increase risk for falls, confusion and hallucinations

## 2022-08-29 NOTE — Telephone Encounter (Signed)
New message    Patient has a questions asking for a prescription for wrist for tremor.

## 2022-08-29 NOTE — Telephone Encounter (Signed)
Patient was calling about the weighted gloves. I gave her the [employer] site to look for them at 25.00$ She also let me know that she had not heard from [place] about her MRI. Called [facility] they had not received that order and I have resent it

## 2022-08-30 ENCOUNTER — Telehealth: Payer: Self-pay | Admitting: Neurology

## 2022-08-30 ENCOUNTER — Telehealth: Payer: Self-pay | Admitting: Internal Medicine

## 2022-08-30 NOTE — Telephone Encounter (Signed)
Dr. Marchelle Gearing, please advise if you are okay sending Xanax to the pharmacy for pt to take prior to the PET to help calm her nerves.

## 2022-08-30 NOTE — Telephone Encounter (Signed)
   She is having a PET Scan on May 30th, ordered by Dr. Elvera Lennox and wonders if Dr. Elvera Lennox can give her for 2 Zanax because she is anxious about PET.  Sending back [facility]  for time sensitive and poss controlled substance.  Please call PT @ [phone] advise action taken.   Pharm is [facility] in [place].

## 2022-08-30 NOTE — Telephone Encounter (Signed)
Pt called in and left a message. She stated she just scheduled her MRI for 09/11/22. She also wants a refill of her prescription sent to [facility] in [place]. She did not specify the name of the medication

## 2022-08-30 NOTE — Telephone Encounter (Signed)
Patient is asking for a xanax for her MRI appointment. She was given one for the PET scan but also needs it for the MRI. Please send to [facility] pharmacy in [place].

## 2022-08-31 NOTE — Telephone Encounter (Signed)
Called and spoke with pt. Pt is scheduled for an MRI 6/4 which she has had an Rx for Xanax 0.25mg  sent to the pharmacy for her to take prior to that.   Pt will need Rx for Xanax sent to the pharmacy by Dr. Marchelle Gearing on 5/29 when he returns back to the office for pt to take prior to having the PET scan 5/30.  Pt has been made aware that this will be done by Dr. Marchelle Gearing when he returns back to the office.  Routing back to Dr. Marchelle Gearing so he can send Rx for Xanax to the pharmacy for pt to take prior to the PET.

## 2022-08-31 NOTE — Telephone Encounter (Signed)
Results have been relayed to the patient/authorized caretaker. The patient/authorized caretaker verbalized understanding. No questions at this time.   

## 2022-08-31 NOTE — Telephone Encounter (Signed)
Yes I am fine but if controlled Rx I cannot do finger print prescriptin till next week

## 2022-09-04 ENCOUNTER — Other Ambulatory Visit (HOSPITAL_COMMUNITY): Payer: Self-pay | Admitting: Cardiovascular Disease

## 2022-09-04 DIAGNOSIS — I6523 Occlusion and stenosis of bilateral carotid arteries: Secondary | ICD-10-CM

## 2022-09-05 ENCOUNTER — Other Ambulatory Visit: Payer: Self-pay | Admitting: Acute Care

## 2022-09-05 ENCOUNTER — Telehealth: Payer: Self-pay | Admitting: Neurology

## 2022-09-05 ENCOUNTER — Telehealth: Payer: Self-pay | Admitting: Internal Medicine

## 2022-09-05 ENCOUNTER — Other Ambulatory Visit: Payer: Self-pay | Admitting: Neurology

## 2022-09-05 ENCOUNTER — Telehealth: Payer: Self-pay | Admitting: Acute Care

## 2022-09-05 DIAGNOSIS — F419 Anxiety disorder, unspecified: Secondary | ICD-10-CM

## 2022-09-05 MED ORDER — LORAZEPAM 0.5 MG PO TABS
ORAL_TABLET | ORAL | 0 refills | Status: DC
Start: 2022-09-05 — End: 2022-09-05

## 2022-09-05 MED ORDER — LORAZEPAM 0.5 MG PO TABS
ORAL_TABLET | ORAL | 0 refills | Status: DC
Start: 2022-09-05 — End: 2022-11-02

## 2022-09-05 MED ORDER — LORAZEPAM 0.5 MG PO TABS: ORAL_TABLET | ORAL | 0 refills | Status: DC

## 2022-09-05 NOTE — Telephone Encounter (Signed)
Called and verified patient is scheduled at [facility] on [facility] [facility] were she had asked to go and they are ready for that

## 2022-09-05 NOTE — Telephone Encounter (Signed)
Called and spoke with patient. She verbalized understanding and repeated the instructions back to me twice. She was very Adult nurse.   Nothing further needed at time of call.

## 2022-09-05 NOTE — Telephone Encounter (Signed)
Pt husband is calling in bc the pharmacy stated they have not rcvd a prescription from [place] for LORazepam (ATIVAN) 0.5 MG tablet

## 2022-09-05 NOTE — Telephone Encounter (Signed)
Patient needs to know where she is to go to have her MRI

## 2022-09-05 NOTE — Telephone Encounter (Signed)
Spoke with the pt  She states that she is needing xanax called to pharm for her PET tomorrow  She has two tablets already that Dr Tat prescribed, but she needs these for the MRI that Dr Tat ordered.   Maralyn Sago- can you please help with this. MR out of the office today, and her scan is tomorrow,thanks!

## 2022-09-05 NOTE — Telephone Encounter (Signed)
PT calling again about these pills for a procedure to be done tomorrow. Her Pharm closes early.

## 2022-09-05 NOTE — Telephone Encounter (Signed)
I have called the patient as she is requesting a Xanax for her PET scan tomorrow.  She is a patient of Dr. Marchelle Gearing . I am APP of the day and have never cared nor seen the patient in the past.  As I am unfamiliar with her, and there is a crossover allergy noted between her povidone iodine allergy and Xanax I am not going to send in Xanax. I have consulted with a pharmacist through [facility] health, Jeannine Boga  the third Pharm.D., and he recommended substituting with Ativan 0.25 mg based on her weight of 95 pounds.  I have sent in that request to her pharmacy of choice. Patient may be dispensed a 0.5 mg tablet that I have instructed be cut in half and only 0.25 mg to be taken 30 to 45 minutes prior to her PET scan. I have asked them to dispense only 1 tablet. Patient states that she has been prescribed Xanax prior to her MRI in a few weeks.  I told her she needs to get in touch with the provider who prescribed that to make sure they are comfortable with her using the Xanax versus Ativan as I have ordered.  She verbalized understanding and stated she would do that.  Triage please call the patient and make sure you instruct the patient that she cannot drive, she must have a family member with her at all times while she is taking this medication and that she is only to take one half of the 0.5 mg tablet prior to her PET scan.  Thank you

## 2022-09-05 NOTE — Telephone Encounter (Signed)
PT calling saying she has a PET scan tomorrow and would like Zanax for this procedure as well. Please call in to Pharm is [facility] in [place].

## 2022-09-06 ENCOUNTER — Encounter (HOSPITAL_COMMUNITY)
Admission: RE | Admit: 2022-09-06 | Discharge: 2022-09-06 | Disposition: A | Discharge status: Home / Self Care | Payer: Medicare HMO | Source: Ambulatory Visit | Attending: Internal Medicine | Admitting: Internal Medicine

## 2022-09-06 DIAGNOSIS — R911 Solitary pulmonary nodule: Secondary | ICD-10-CM | POA: Diagnosis present

## 2022-09-06 MED ORDER — FLUDEOXYGLUCOSE F - 18 (FDG) INJECTION
5.6300 | Freq: Once | INTRAVENOUS | Status: AC | PRN
  Administered 2022-09-06: 5.63 via INTRAVENOUS

## 2022-09-06 NOTE — Telephone Encounter (Signed)
I spoke with the patient. She did get the prescription yesterday and had her PET scan done today.  Nothing further needed.

## 2022-09-19 ENCOUNTER — Telehealth: Payer: Self-pay | Admitting: Neurology

## 2022-09-19 NOTE — Telephone Encounter (Signed)
Let pt know that her MRI from [facility] still shows the meningioma (benign brain mass) but it is close/invading the transverse sinus.  This sinus drains venous blood from the brain.  I would like neurosx to look at her because of the proximity to this sinus.  If agreeable, refer to [facility].

## 2022-09-19 NOTE — Telephone Encounter (Signed)
Patient has refused at this time to go to Neuro [facility] she did say she would think about it but at this time with her age she is not sure she is up for a surgery

## 2022-09-20 ENCOUNTER — Other Ambulatory Visit: Payer: Self-pay

## 2022-09-20 ENCOUNTER — Telehealth: Payer: Self-pay | Admitting: Internal Medicine

## 2022-09-20 DIAGNOSIS — R9389 Abnormal findings on diagnostic imaging of other specified body structures: Secondary | ICD-10-CM

## 2022-09-20 DIAGNOSIS — D320 Benign neoplasm of cerebral meninges: Secondary | ICD-10-CM

## 2022-09-20 DIAGNOSIS — R930 Abnormal findings on diagnostic imaging of skull and head, not elsewhere classified: Secondary | ICD-10-CM

## 2022-09-20 MED ORDER — AMIODARONE HCL 200 MG PO TABS: 200.0000 mg | ORAL_TABLET | Freq: Two times a day (BID) | ORAL | 3 refills | Status: DC

## 2022-09-20 NOTE — Telephone Encounter (Signed)
Patient called to inform MR that she did not want to see a thoracic surgery specialist at this time. Patient is going to call Dr. Dorris Fetch to cancel appt. Patient also wanted her PET scan results sent to her in [place]. Please advise.

## 2022-09-20 NOTE — Telephone Encounter (Signed)
Talked with patient this morning and gave additional information from Dr. Arbutus Leas about her MRI and patient has agreed to have this monitored by [facility] sending referral today. Patient also wanting a RX sent to [facility] to be fitted for weighted glove. She did not want the ones on [employer]

## 2022-09-23 NOTE — Telephone Encounter (Signed)
  Ok if she does not want to see CT surgery -> she can cancer that but do a CT chest without contrast in 3 months and have aoffice visit. She can move the July 2024 office visit to early sept 2024 but she can keep the July visit if there are things she likes to discss   xxxxxxxxxxxxxxxxxxxxxxxxxxxxxxxxx Narrative & Impression  CLINICAL DATA:  Initial treatment strategy for left lower lobe nodule.   EXAM: NUCLEAR MEDICINE PET SKULL BASE TO THIGH   TECHNIQUE: 5.6 mCi F-18 FDG was injected intravenously. Full-ring PET imaging was performed from the skull base to thigh after the radiotracer. CT data was obtained and used for attenuation correction and anatomic localization.   Fasting blood glucose: 114 mg/dl   COMPARISON:  CT chest dated 08/16/2022. CT chest abdomen pelvis dated 06/18/2022.   FINDINGS: Mediastinal blood pool activity: SUV max 2.4   Liver activity: SUV max NA   NECK: No hypermetabolic cervical lymphadenopathy.   Incidental CT findings: None.   CHEST: No hypermetabolic thoracic lymphadenopathy. Status post left axillary lymph node dissection.   11 mm cavitary lesion in the left lower lobe with a mildly thickened wall (series 7/image 81), but without hypermetabolism on PET.   Incidental CT findings: Atherosclerotic calcifications of the aortic arch. Moderate three-vessel coronary atherosclerosis. Mild linear/patchy right lower lobe opacity, likely scarring/atelectasis. Mild centrilobular and paraseptal emphysematous changes, upper lung predominant. Right apical pleural-parenchymal scarring.   ABDOMEN/PELVIS: No abnormal hypermetabolism in the liver, spleen, pancreas, or adrenal glands.   No hypermetabolic abdominopelvic lymphadenopathy.   Incidental CT findings: Moderate hiatal hernia. Atherosclerotic calcifications of the abdominal aorta and branch vessels.   SKELETON: No focal hypermetabolic activity to suggest skeletal metastasis.   Incidental CT  findings: Degenerative changes of the visualized thoracolumbar spine. Lumbar dextroscoliosis.   IMPRESSION: 11 mm cavitary lesion in the left lower lobe, non FDG avid. While this may be infectious/inflammatory, a low-grade neoplasm is not excluded. Follow-up CT chest is suggested in 3-6 months.   No findings suspicious for metastatic disease.     Electronically Signed   By: Charline Bills M.D.   On: 09/12/2022 00:05

## 2022-09-25 ENCOUNTER — Other Ambulatory Visit: Payer: Self-pay

## 2022-09-25 DIAGNOSIS — R270 Ataxia, unspecified: Secondary | ICD-10-CM

## 2022-09-25 DIAGNOSIS — S0990XA Unspecified injury of head, initial encounter: Secondary | ICD-10-CM

## 2022-09-25 DIAGNOSIS — G250 Essential tremor: Secondary | ICD-10-CM

## 2022-09-25 MED ORDER — AMBULATORY NON FORMULARY MEDICATION
0 refills | Status: AC
Start: 2022-09-25 — End: ?

## 2022-09-25 NOTE — Telephone Encounter (Signed)
Packet has been sent to [facility]

## 2022-09-25 NOTE — Telephone Encounter (Signed)
Pt. Calling back to speak to nurse

## 2022-09-25 NOTE — Telephone Encounter (Signed)
Called the pt and she was sleeping- LMTCB with her spouse

## 2022-09-26 NOTE — Telephone Encounter (Signed)
Patient is returning phone call. Patient phone number is [phone]. 

## 2022-09-26 NOTE — Telephone Encounter (Signed)
ATC LVMTCB x 1  

## 2022-09-27 ENCOUNTER — Other Ambulatory Visit: Payer: Self-pay | Admitting: *Deleted

## 2022-09-27 DIAGNOSIS — I7025 Atherosclerosis of native arteries of other extremities with ulceration: Secondary | ICD-10-CM

## 2022-09-27 DIAGNOSIS — I70222 Atherosclerosis of native arteries of extremities with rest pain, left leg: Secondary | ICD-10-CM

## 2022-09-27 DIAGNOSIS — I739 Peripheral vascular disease, unspecified: Secondary | ICD-10-CM

## 2022-09-27 DIAGNOSIS — I70223 Atherosclerosis of native arteries of extremities with rest pain, bilateral legs: Secondary | ICD-10-CM

## 2022-09-27 DIAGNOSIS — M79604 Pain in right leg: Secondary | ICD-10-CM

## 2022-09-28 NOTE — Telephone Encounter (Signed)
Called and spoke with the pt and notified of results/recs per MR S She verbalized understanding  She states that she will keep her appt for July 2024  She does not however, want [place] to order any further scans  She states that she will discuss this further with MR on upcoming visit  Nothing further needed

## 2022-10-02 ENCOUNTER — Encounter: Payer: Medicare HMO | Admitting: Thoracic Surgery (Cardiothoracic Vascular Surgery)

## 2022-10-03 ENCOUNTER — Ambulatory Visit (INDEPENDENT_AMBULATORY_CARE_PROVIDER_SITE_OTHER): Payer: Medicare HMO | Admitting: Podiatry

## 2022-10-03 DIAGNOSIS — L97322 Non-pressure chronic ulcer of left ankle with fat layer exposed: Secondary | ICD-10-CM

## 2022-10-03 NOTE — Progress Notes (Signed)
Subjective:  Patient ID: [name], female    DOB: 01-May-1940,  MRN: [id number]  Chief Complaint  Patient presents with   Wound Check     82 y.o. female returns for post-op check.  Patient states that she is doing okay.  She states that the top of the foot is still doing better.  Ankle wound is still present.  Wound care nurses helping  Review of Systems: Negative except as noted in the HPI. Denies N/V/F/Ch.  Past Medical History:  Diagnosis Date   Anemia 05/2014.   Atrial fibrillation (HCC)    x 3 yrs   Breast cancer (HCC)    S/P left mastectomy and chemotherapy 1989 remained in remission   Carotid artery occlusion    Carotid bruit    LICA 60-79% (duplex 9/11)   Degenerative arthritis of spine 2015   Chronic back pain   Hyperlipidemia    Hypertension    Meningioma (HCC)    Osteoporosis    Pneumonia May 19, 2014   Stroke [facility])    CVA 2008   Subclavian steal syndrome    Ulcers of both lower extremities (HCC) 2015    Current Outpatient Medications:    ALPRAZolam (XANAX) 0.25 MG tablet, Take 1 tablet (0.25 mg total) by mouth at bedtime as needed for anxiety. 1 tablet 30-45 min prior to MRI, Disp: 2 tablet, Rfl: 0   AMBULATORY NON FORMULARY MEDICATION, Dispense one Readi Steadi glove, Disp: 1 Units, Rfl: 0   amiodarone (PACERONE) 200 MG tablet, Take 1 tablet (200 mg total) by mouth 2 (two) times daily., Disp: 180 tablet, Rfl: 3   apixaban (ELIQUIS) 2.5 MG TABS tablet, Take 1 tablet (2.5 mg total) by mouth 2 (two) times daily., Disp: 180 tablet, Rfl: 1   ascorbic acid (VITAMIN C) 500 MG tablet, Take 500 mg by mouth in the morning., Disp: , Rfl:    CALCIUM PO, Take 1,200 mg by mouth 2 (two) times daily., Disp: , Rfl:    Camphor-Menthol-Methyl Sal (SALONPAS) 3.04-14-08 % PTCH, Place 1 patch onto the skin daily as needed (pain.)., Disp: , Rfl:    Cholecalciferol (VITAMIN D-3) 25 MCG (1000 UT) CAPS, Take 1,000-2,000 tablets by mouth See admin instructions. Take 1000 units  tablet by mouth in the morning & take 40102 units tablets by mouth at night., Disp: , Rfl:    clopidogrel (PLAVIX) 75 MG tablet, TAKE 1 TABLET EVERY MORNING, Disp: 90 tablet, Rfl: 3   denosumab (PROLIA) 60 MG/ML SOSY injection, Inject 60 mg into the skin every 6 (six) months., Disp: , Rfl:    Ferrous Sulfate (IRON PO), Take 65 mg by mouth at bedtime., Disp: , Rfl:    furosemide (LASIX) 20 MG tablet, Take 1 tablet (20 mg total) by mouth 2 (two) times daily. (Patient taking differently: Take 20 mg by mouth in the morning.), Disp: 30 tablet, Rfl: 0   LORazepam (ATIVAN) 0.5 MG tablet, Take 0.25 mg ( one half tablet of a 0.5 mg ) 30 - 45 minutes before PET scan. Do not drive if sleepy. Family member must stay with patient while taking this medication., Disp: 1 tablet, Rfl: 0   MAGNESIUM OXIDE PO, Take 500 mg by mouth every morning., Disp: , Rfl:    metoprolol tartrate (LOPRESSOR) 25 MG tablet, TAKE 3 TABLETS TWICE A DAY, Disp: 540 tablet, Rfl: 3   Multiple Vitamin (MULTIVITAMIN WITH MINERALS) TABS tablet, Take 1 tablet by mouth every morning., Disp: , Rfl:    pantoprazole (PROTONIX)  40 MG tablet, TAKE 1 TABLET DAILY, Disp: 90 tablet, Rfl: 3   potassium chloride (KLOR-CON M) 10 MEQ tablet, Take 1 tablet (10 mEq total) by mouth daily. (Patient taking differently: Take 10 mEq by mouth in the morning.), Disp: 30 tablet, Rfl: 2   pravastatin (PRAVACHOL) 40 MG tablet, TAKE 1 TABLET DAILY, Disp: 90 tablet, Rfl: 3   SANTYL 250 UNIT/GM ointment, APPLY TO AFFECTED AREAS ONCE DAILY. (Patient taking differently: Apply 1 Application topically daily.), Disp: 30 g, Rfl: 0   umeclidinium bromide (INCRUSE ELLIPTA) 62.5 MCG/ACT AEPB, Inhale 1 puff into the lungs daily., Disp: 90 each, Rfl: 3  Social History   Tobacco Use  Smoking Status Former   Packs/day: 1.00   Years: 56.00   Additional pack years: 0.00   Total pack years: 56.00   Types: Cigarettes   Quit date: 08/14/2006   Years since quitting: 16.1   Passive  exposure: Never  Smokeless Tobacco Never  Tobacco Comments   quit in 2008    Allergies  Allergen Reactions   Iodine Rash and Other (See Comments)    BETADINE Rash/burning, blisters on skin. Burns skin   Iohexol Rash and Other (See Comments)    Blisters; PT NEEDS 13-HOUR PREP    Sulfa Antibiotics Nausea And Vomiting   Petroleum Gauze Non-Woven 3x9" [Wound Dressings] Other (See Comments)    BURNING SENSATION, RED SKIN   Povidone-Iodine Other (See Comments)    Burns skin   Wound Dressing Adhesive Other (See Comments)    Burns skin   Tape Itching, Rash and Other (See Comments)    "DO NOT USE ADHESIVE TAPE" Not even a Band Aid" NO PAPER TAPE Burns skin Skin is very very thin   Objective:   There were no vitals filed for this visit.  There is no height or weight on file to calculate BMI. Constitutional Well developed. Well nourished.  Vascular Foot warm and well perfused. Capillary refill normal to all digits.   Neurologic Normal speech. Oriented to person, place, and time. Epicritic sensation to light touch grossly present bilaterally.  Dermatologic  Left lateral ankle with fat layer exposed.  No signs of infection noted.  No malodor present does not probe down to bone.  Orthopedic: No tenderness to palpation noted about the surgical site.   Radiographs: None Assessment:   No diagnosis found.    Plan:  Patient was evaluated and treated and all questions answered.  S/p foot surgery left -Clinic healed.  No signs of dehiscence noted.  No complications noted.  Left dorsal foot wound with fat layer exposed -Clinically healed  Left lateral ankle with fat layer exposed -Continue Santyl wet-to-dry dressing.  Appears to be slowly getting better.  Will continue monitor the progression of it.  Minimal debridement was carried out with mechanical debridement. -Patient will benefit from Unna boot compression dressing to help heal the wound.  She states understand will have  home health wound care nurse to begin Unna boot compression No follow-ups on file.    No follow-ups on file.

## 2022-10-05 ENCOUNTER — Ambulatory Visit: Payer: Medicare HMO | Admitting: Podiatry

## 2022-10-08 ENCOUNTER — Encounter: Payer: Self-pay | Admitting: Vascular Surgery

## 2022-10-08 ENCOUNTER — Ambulatory Visit (INDEPENDENT_AMBULATORY_CARE_PROVIDER_SITE_OTHER)
Admission: RE | Admit: 2022-10-08 | Discharge: 2022-10-08 | Disposition: A | Discharge status: Home / Self Care | Payer: Medicare HMO | Source: Ambulatory Visit | Attending: Surgery | Admitting: Surgery

## 2022-10-08 ENCOUNTER — Ambulatory Visit (INDEPENDENT_AMBULATORY_CARE_PROVIDER_SITE_OTHER): Payer: Medicare HMO | Admitting: Physician Assistant

## 2022-10-08 ENCOUNTER — Ambulatory Visit (HOSPITAL_COMMUNITY)
Admission: RE | Admit: 2022-10-08 | Discharge: 2022-10-08 | Disposition: A | Discharge status: Home / Self Care | Payer: Medicare HMO | Source: Ambulatory Visit | Attending: Surgery | Admitting: Surgery

## 2022-10-08 ENCOUNTER — Other Ambulatory Visit: Payer: Self-pay

## 2022-10-08 VITALS — BP 198/77 | HR 55 | Temp 97.5°F | Resp 14 | Ht 63.0 in | Wt 93.0 lb

## 2022-10-08 DIAGNOSIS — I7025 Atherosclerosis of native arteries of other extremities with ulceration: Secondary | ICD-10-CM

## 2022-10-08 DIAGNOSIS — I70223 Atherosclerosis of native arteries of extremities with rest pain, bilateral legs: Secondary | ICD-10-CM | POA: Diagnosis not present

## 2022-10-08 DIAGNOSIS — M79605 Pain in left leg: Secondary | ICD-10-CM

## 2022-10-08 DIAGNOSIS — M79604 Pain in right leg: Secondary | ICD-10-CM | POA: Diagnosis not present

## 2022-10-08 DIAGNOSIS — I739 Peripheral vascular disease, unspecified: Secondary | ICD-10-CM | POA: Insufficient documentation

## 2022-10-08 DIAGNOSIS — I70233 Atherosclerosis of native arteries of right leg with ulceration of ankle: Secondary | ICD-10-CM

## 2022-10-08 DIAGNOSIS — I70222 Atherosclerosis of native arteries of extremities with rest pain, left leg: Secondary | ICD-10-CM

## 2022-10-09 LAB — VAS US ABI WITH/WO TBI
Left ABI: 1.11
Right ABI: 0.65

## 2022-10-10 NOTE — Progress Notes (Signed)
Office Note   History of Present Illness   [name] is a 82 y.o. (11-Sep-1940) female who presents as a follow-up.  She recently underwent laser atherectomy with angioplasty of the left popliteal and anterior tibial arteries on 08/28/2022 by Dr. Myra Gianotti.  This was done to improve blood flow with hopes to heal a chronic left ankle wound.  She has previously underwent left SFA stenting and angioplasty of the left popliteal and peroneal arteries on 12/19/2021 by Dr. Myra Gianotti.  She subsequently underwent left great toe amputation by Dr. Allena Katz on 12/29/2021.  The patient returns today for follow up.  Her left ankle wound is slowly getting better.  She has home health care nurses that help with Santyl wet-to-dry dressings of the wound.  Unfortunately since her angiogram she has developed a similar ankle wound on the right side.  This has been present for about 2 weeks.  It has not gotten worse or better since appearing.  She denies any fevers, erythema, or drainage from the area.  She denies any rest pain.  Current Outpatient Medications  Medication Sig Dispense Refill   ALPRAZolam (XANAX) 0.25 MG tablet Take 1 tablet (0.25 mg total) by mouth at bedtime as needed for anxiety. 1 tablet 30-45 min prior to MRI 2 tablet 0   AMBULATORY NON FORMULARY MEDICATION Dispense one Readi Steadi glove 1 Units 0   amiodarone (PACERONE) 200 MG tablet Take 1 tablet (200 mg total) by mouth 2 (two) times daily. 180 tablet 3   apixaban (ELIQUIS) 2.5 MG TABS tablet Take 1 tablet (2.5 mg total) by mouth 2 (two) times daily. 180 tablet 1   ascorbic acid (VITAMIN C) 500 MG tablet Take 500 mg by mouth in the morning.     CALCIUM PO Take 1,200 mg by mouth 2 (two) times daily.     Camphor-Menthol-Methyl Sal (SALONPAS) 3.04-14-08 % PTCH Place 1 patch onto the skin daily as needed (pain.).     Cholecalciferol (VITAMIN D-3) 25 MCG (1000 UT) CAPS Take 1,000-2,000 tablets by mouth See admin instructions. Take 1000 units tablet by  mouth in the morning & take 16109 units tablets by mouth at night.     clopidogrel (PLAVIX) 75 MG tablet TAKE 1 TABLET EVERY MORNING 90 tablet 3   denosumab (PROLIA) 60 MG/ML SOSY injection Inject 60 mg into the skin every 6 (six) months.     Ferrous Sulfate (IRON PO) Take 65 mg by mouth at bedtime.     furosemide (LASIX) 20 MG tablet Take 1 tablet (20 mg total) by mouth 2 (two) times daily. (Patient taking differently: Take 20 mg by mouth in the morning.) 30 tablet 0   LORazepam (ATIVAN) 0.5 MG tablet Take 0.25 mg ( one half tablet of a 0.5 mg ) 30 - 45 minutes before PET scan. Do not drive if sleepy. Family member must stay with patient while taking this medication. 1 tablet 0   MAGNESIUM OXIDE PO Take 500 mg by mouth every morning.     metoprolol tartrate (LOPRESSOR) 25 MG tablet TAKE 3 TABLETS TWICE A DAY 540 tablet 3   Multiple Vitamin (MULTIVITAMIN WITH MINERALS) TABS tablet Take 1 tablet by mouth every morning.     pantoprazole (PROTONIX) 40 MG tablet TAKE 1 TABLET DAILY 90 tablet 3   potassium chloride (KLOR-CON M) 10 MEQ tablet Take 1 tablet (10 mEq total) by mouth daily. (Patient taking differently: Take 10 mEq by mouth in the morning.) 30 tablet 2  pravastatin (PRAVACHOL) 40 MG tablet TAKE 1 TABLET DAILY 90 tablet 3   SANTYL 250 UNIT/GM ointment APPLY TO AFFECTED AREAS ONCE DAILY. (Patient taking differently: Apply 1 Application topically daily.) 30 g 0   umeclidinium bromide (INCRUSE ELLIPTA) 62.5 MCG/ACT AEPB Inhale 1 puff into the lungs daily. 90 each 3   No current facility-administered medications for this visit.    REVIEW OF SYSTEMS (negative unless checked):   Cardiac:  []  Chest pain or chest pressure? []  Shortness of breath upon activity? []  Shortness of breath when lying flat? []  Irregular heart rhythm?  Vascular:  []  Pain in calf, thigh, or hip brought on by walking? []  Pain in feet at night that wakes you up from your sleep? []  Blood clot in your veins? []  Leg  swelling?  Pulmonary:  []  Oxygen at home? []  Productive cough? []  Wheezing?  Neurologic:  []  Sudden weakness in arms or legs? []  Sudden numbness in arms or legs? []  Sudden onset of difficult speaking or slurred speech? []  Temporary loss of vision in one eye? []  Problems with dizziness?  Gastrointestinal:  []  Blood in stool? []  Vomited blood?  Genitourinary:  []  Burning when urinating? []  Blood in urine?  Psychiatric:  []  Major depression  Hematologic:  []  Bleeding problems? []  Problems with blood clotting?  Dermatologic:  []  Rashes or ulcers?  Constitutional:  []  Fever or chills?  Ear/Nose/Throat:  []  Change in hearing? []  Nose bleeds? []  Sore throat?  Musculoskeletal:  []  Back pain? []  Joint pain? []  Muscle pain?   Physical Examination   Vitals:   10/08/22 1051 10/08/22 1054  BP: (!) 191/75 (!) 198/77  Pulse: (!) 55 (!) 55  Resp: 14   Temp: (!) 97.5 F (36.4 C)   TempSrc: Temporal   SpO2: 95%   Weight: 93 lb (42.2 kg)   Height: 5\' 3"  (1.6 m)    Body mass index is 16.47 kg/m.  General:  WDWN in NAD; vital signs documented above Gait: Not observed HENT: WNL, normocephalic Pulmonary: normal non-labored breathing , without rales, rhonchi,  wheezing Cardiac: regular Abdomen: soft, NT, no masses Skin: without rashes Vascular Exam/Pulses: Palpable femoral pulses bilaterally.  Biphasic right DP Doppler signal.  Triphasic left DP/PT Doppler signals left Extremities: Healing wound of left lateral malleolus.  Stable appearing but new wound of right lateral malleolus Musculoskeletal: no muscle wasting or atrophy  Neurologic: A&O X 3;  No focal weakness or paresthesias are detected Psychiatric:  The pt has Normal affect.  Non-Invasive Vascular imaging   ABI (10/08/2022) R:  ABI: 0.65 (1.02),  PT: none DP: bi TBI:  0.41 L:  ABI: 1.11 (1.01),  PT: tri DP: tri TBI: amp  LLE Arterial Duplex  (10/08/2022) +-----------+--------+-----+--------+----------+---------------------------  ----+  LEFT      PSV cm/sRatioStenosisWaveform  Comments                          +-----------+--------+-----+--------+----------+---------------------------  ----+  CFA Distal 128                  biphasic                                    +-----------+--------+-----+--------+----------+---------------------------  ----+  DFA       86                   monophasic                                  +-----------+--------+-----+--------+----------+---------------------------  ----+  TP Trunk   91                   biphasic                                    +-----------+--------+-----+--------+----------+---------------------------  ----+  ATA Distal 78                   biphasic                                    +-----------+--------+-----+--------+----------+---------------------------  ----+  PTA Prox   54                   biphasic                                    +-----------+--------+-----+--------+----------+---------------------------  ----+  PTA Distal 83                   biphasic  Occluded distally,                                                          reconstitutes at foot             +-----------+--------+-----+--------+----------+---------------------------  ----+  PERO Prox  107                  biphasic                                    +-----------+--------+-----+--------+----------+---------------------------  ----+  PERO Distal29                   biphasic                                    +-----------+--------+-----+--------+----------+---------------------------  ----+     Left Stent(s):  +---------------+--------+--------+--------+--------+  SFA           PSV cm/sStenosisWaveformComments  +---------------+--------+--------+--------+--------+  Prox to Stent  104              biphasic          +---------------+--------+--------+--------+--------+  Proximal Stent 90              biphasic          +---------------+--------+--------+--------+--------+  Mid Stent      52              biphasic          +---------------+--------+--------+--------+--------+  Distal Stent   69              biphasic          +---------------+--------+--------+--------+--------+  Distal to Stent51              biphasic           Medical Decision Making   [name] is a 82 y.o. female who presents for a post op  visit  Based on the patient's vascular studies, her ABIs on the left are improved.  Her left ABI is 1.11 and she has triphasic DP/PT flow Arterial duplex of the left lower extremity demonstrates a patent left SFA stent without stenosis.  There is patent AT/peroneal runoff to the foot The patient's left lateral malleolus wound is slowly improving.  Unfortunately she has developed a new wound on her right lateral malleolus.  This has been present for about a week.  There are no signs of infection currently On exam she has a palpable right femoral pulse.  She has nonpalpable pedal pulses on the right.  Her ABI on the right is 0.65.  She will likely require endovascular intervention to improve chances of wound healing. We will schedule her for abdominal aortogram with right lower extremity runoff and likely tibial intervention in the next 1 to 2 weeks with Dr. Bjorn Pippin PA-C [facility] Office: [phone]  Clinic MD: Myra Gianotti

## 2022-10-10 NOTE — H&P (View-Only) (Signed)
  Office Note   History of Present Illness   [name] is a 82 y.o. (05/04/1940) female who presents as a follow-up.  She recently underwent laser atherectomy with angioplasty of the left popliteal and anterior tibial arteries on 08/28/2022 by Dr. Brabham.  This was done to improve blood flow with hopes to heal a chronic left ankle wound.  She has previously underwent left SFA stenting and angioplasty of the left popliteal and peroneal arteries on 12/19/2021 by Dr. Brabham.  She subsequently underwent left great toe amputation by Dr. Patel on 12/29/2021.  The patient returns today for follow up.  Her left ankle wound is slowly getting better.  She has home health care nurses that help with Santyl wet-to-dry dressings of the wound.  Unfortunately since her angiogram she has developed a similar ankle wound on the right side.  This has been present for about 2 weeks.  It has not gotten worse or better since appearing.  She denies any fevers, erythema, or drainage from the area.  She denies any rest pain.  Current Outpatient Medications  Medication Sig Dispense Refill   ALPRAZolam (XANAX) 0.25 MG tablet Take 1 tablet (0.25 mg total) by mouth at bedtime as needed for anxiety. 1 tablet 30-45 min prior to MRI 2 tablet 0   AMBULATORY NON FORMULARY MEDICATION Dispense one Readi Steadi glove 1 Units 0   amiodarone (PACERONE) 200 MG tablet Take 1 tablet (200 mg total) by mouth 2 (two) times daily. 180 tablet 3   apixaban (ELIQUIS) 2.5 MG TABS tablet Take 1 tablet (2.5 mg total) by mouth 2 (two) times daily. 180 tablet 1   ascorbic acid (VITAMIN C) 500 MG tablet Take 500 mg by mouth in the morning.     CALCIUM PO Take 1,200 mg by mouth 2 (two) times daily.     Camphor-Menthol-Methyl Sal (SALONPAS) 3.04-14-08 % PTCH Place 1 patch onto the skin daily as needed (pain.).     Cholecalciferol (VITAMIN D-3) 25 MCG (1000 UT) CAPS Take 1,000-2,000 tablets by mouth See admin instructions. Take 1000 units tablet by  mouth in the morning & take 22000 units tablets by mouth at night.     clopidogrel (PLAVIX) 75 MG tablet TAKE 1 TABLET EVERY MORNING 90 tablet 3   denosumab (PROLIA) 60 MG/ML SOSY injection Inject 60 mg into the skin every 6 (six) months.     Ferrous Sulfate (IRON PO) Take 65 mg by mouth at bedtime.     furosemide (LASIX) 20 MG tablet Take 1 tablet (20 mg total) by mouth 2 (two) times daily. (Patient taking differently: Take 20 mg by mouth in the morning.) 30 tablet 0   LORazepam (ATIVAN) 0.5 MG tablet Take 0.25 mg ( one half tablet of a 0.5 mg ) 30 - 45 minutes before PET scan. Do not drive if sleepy. Family member must stay with patient while taking this medication. 1 tablet 0   MAGNESIUM OXIDE PO Take 500 mg by mouth every morning.     metoprolol tartrate (LOPRESSOR) 25 MG tablet TAKE 3 TABLETS TWICE A DAY 540 tablet 3   Multiple Vitamin (MULTIVITAMIN WITH MINERALS) TABS tablet Take 1 tablet by mouth every morning.     pantoprazole (PROTONIX) 40 MG tablet TAKE 1 TABLET DAILY 90 tablet 3   potassium chloride (KLOR-CON M) 10 MEQ tablet Take 1 tablet (10 mEq total) by mouth daily. (Patient taking differently: Take 10 mEq by mouth in the morning.) 30 tablet 2     pravastatin (PRAVACHOL) 40 MG tablet TAKE 1 TABLET DAILY 90 tablet 3   SANTYL 250 UNIT/GM ointment APPLY TO AFFECTED AREAS ONCE DAILY. (Patient taking differently: Apply 1 Application topically daily.) 30 g 0   umeclidinium bromide (INCRUSE ELLIPTA) 62.5 MCG/ACT AEPB Inhale 1 puff into the lungs daily. 90 each 3   No current facility-administered medications for this visit.    REVIEW OF SYSTEMS (negative unless checked):   Cardiac:  [] Chest pain or chest pressure? [] Shortness of breath upon activity? [] Shortness of breath when lying flat? [] Irregular heart rhythm?  Vascular:  [] Pain in calf, thigh, or hip brought on by walking? [] Pain in feet at night that wakes you up from your sleep? [] Blood clot in your veins? [] Leg  swelling?  Pulmonary:  [] Oxygen at home? [] Productive cough? [] Wheezing?  Neurologic:  [] Sudden weakness in arms or legs? [] Sudden numbness in arms or legs? [] Sudden onset of difficult speaking or slurred speech? [] Temporary loss of vision in one eye? [] Problems with dizziness?  Gastrointestinal:  [] Blood in stool? [] Vomited blood?  Genitourinary:  [] Burning when urinating? [] Blood in urine?  Psychiatric:  [] Major depression  Hematologic:  [] Bleeding problems? [] Problems with blood clotting?  Dermatologic:  [] Rashes or ulcers?  Constitutional:  [] Fever or chills?  Ear/Nose/Throat:  [] Change in hearing? [] Nose bleeds? [] Sore throat?  Musculoskeletal:  [] Back pain? [] Joint pain? [] Muscle pain?   Physical Examination   Vitals:   10/08/22 1051 10/08/22 1054  BP: (!) 191/75 (!) 198/77  Pulse: (!) 55 (!) 55  Resp: 14   Temp: (!) 97.5 F (36.4 C)   TempSrc: Temporal   SpO2: 95%   Weight: 93 lb (42.2 kg)   Height: 5' 3" (1.6 m)    Body mass index is 16.47 kg/m.  General:  WDWN in NAD; vital signs documented above Gait: Not observed HENT: WNL, normocephalic Pulmonary: normal non-labored breathing , without rales, rhonchi,  wheezing Cardiac: regular Abdomen: soft, NT, no masses Skin: without rashes Vascular Exam/Pulses: Palpable femoral pulses bilaterally.  Biphasic right DP Doppler signal.  Triphasic left DP/PT Doppler signals left Extremities: Healing wound of left lateral malleolus.  Stable appearing but new wound of right lateral malleolus Musculoskeletal: no muscle wasting or atrophy  Neurologic: A&O X 3;  No focal weakness or paresthesias are detected Psychiatric:  The pt has Normal affect.  Non-Invasive Vascular imaging   ABI (10/08/2022) R:  ABI: 0.65 (1.02),  PT: none DP: bi TBI:  0.41 L:  ABI: 1.11 (1.01),  PT: tri DP: tri TBI: amp  LLE Arterial Duplex  (10/08/2022) +-----------+--------+-----+--------+----------+---------------------------  ----+  LEFT      PSV cm/sRatioStenosisWaveform  Comments                          +-----------+--------+-----+--------+----------+---------------------------  ----+  CFA Distal 128                  biphasic                                    +-----------+--------+-----+--------+----------+---------------------------  ----+  DFA       86                   monophasic                                  +-----------+--------+-----+--------+----------+---------------------------  ----+    TP Trunk   91                   biphasic                                    +-----------+--------+-----+--------+----------+---------------------------  ----+  ATA Distal 78                   biphasic                                    +-----------+--------+-----+--------+----------+---------------------------  ----+  PTA Prox   54                   biphasic                                    +-----------+--------+-----+--------+----------+---------------------------  ----+  PTA Distal 83                   biphasic  Occluded distally,                                                          reconstitutes at foot             +-----------+--------+-----+--------+----------+---------------------------  ----+  PERO Prox  107                  biphasic                                    +-----------+--------+-----+--------+----------+---------------------------  ----+  PERO Distal29                   biphasic                                    +-----------+--------+-----+--------+----------+---------------------------  ----+     Left Stent(s):  +---------------+--------+--------+--------+--------+  SFA           PSV cm/sStenosisWaveformComments  +---------------+--------+--------+--------+--------+  Prox to Stent  104              biphasic          +---------------+--------+--------+--------+--------+  Proximal Stent 90              biphasic          +---------------+--------+--------+--------+--------+  Mid Stent      52              biphasic          +---------------+--------+--------+--------+--------+  Distal Stent   69              biphasic          +---------------+--------+--------+--------+--------+  Distal to Stent51              biphasic           Medical Decision Making   [name] is a 82 y.o. female who presents for a post op   visit  Based on the patient's vascular studies, her ABIs on the left are improved.  Her left ABI is 1.11 and she has triphasic DP/PT flow Arterial duplex of the left lower extremity demonstrates a patent left SFA stent without stenosis.  There is patent AT/peroneal runoff to the foot The patient's left lateral malleolus wound is slowly improving.  Unfortunately she has developed a new wound on her right lateral malleolus.  This has been present for about a week.  There are no signs of infection currently On exam she has a palpable right femoral pulse.  She has nonpalpable pedal pulses on the right.  Her ABI on the right is 0.65.  She will likely require endovascular intervention to improve chances of wound healing. We will schedule her for abdominal aortogram with right lower extremity runoff and likely tibial intervention in the next 1 to 2 weeks with Dr. Brabham   Randale Carvalho PA-C [facility] Office: [phone]  Clinic MD: Brabham 

## 2022-10-12 ENCOUNTER — Other Ambulatory Visit: Payer: Self-pay

## 2022-10-12 MED ORDER — PANTOPRAZOLE SODIUM 40 MG PO TBEC: 40.0000 mg | DELAYED_RELEASE_TABLET | Freq: Every day | ORAL | 2 refills | Status: DC

## 2022-10-16 ENCOUNTER — Encounter (HOSPITAL_COMMUNITY)
Admission: RE | Disposition: A | Discharge status: Home / Self Care | Payer: Self-pay | Source: Ambulatory Visit | Attending: Surgery

## 2022-10-16 ENCOUNTER — Ambulatory Visit (HOSPITAL_COMMUNITY)
Admission: RE | Admit: 2022-10-16 | Discharge: 2022-10-16 | Disposition: A | Discharge status: Home / Self Care | Payer: Medicare HMO | Source: Ambulatory Visit | Attending: Surgery | Admitting: Surgery

## 2022-10-16 DIAGNOSIS — I70233 Atherosclerosis of native arteries of right leg with ulceration of ankle: Secondary | ICD-10-CM | POA: Diagnosis not present

## 2022-10-16 DIAGNOSIS — L97319 Non-pressure chronic ulcer of right ankle with unspecified severity: Secondary | ICD-10-CM | POA: Insufficient documentation

## 2022-10-16 HISTORY — PX: PERIPHERAL VASCULAR BALLOON ANGIOPLASTY: CATH118281

## 2022-10-16 HISTORY — PX: ABDOMINAL AORTOGRAM W/LOWER EXTREMITY: CATH118223

## 2022-10-16 LAB — POCT I-STAT, CHEM 8
BUN: 40 mg/dL — ABNORMAL HIGH (ref 8–23)
Calcium, Ion: 1.21 mmol/L (ref 1.15–1.40)
Chloride: 105 mmol/L (ref 98–111)
Creatinine, Ser: 1.5 mg/dL — ABNORMAL HIGH (ref 0.44–1.00)
Glucose, Bld: 92 mg/dL (ref 70–99)
HCT: 35 % — ABNORMAL LOW (ref 36.0–46.0)
Hemoglobin: 11.9 g/dL — ABNORMAL LOW (ref 12.0–15.0)
Potassium: 5.3 mmol/L — ABNORMAL HIGH (ref 3.5–5.1)
Sodium: 140 mmol/L (ref 135–145)
TCO2: 32 mmol/L (ref 22–32)

## 2022-10-16 LAB — POCT ACTIVATED CLOTTING TIME: Activated Clotting Time: 250 seconds

## 2022-10-16 SURGERY — ABDOMINAL AORTOGRAM W/LOWER EXTREMITY
Anesthesia: LOCAL

## 2022-10-16 MED ORDER — IODIXANOL 320 MG/ML IV SOLN
INTRAVENOUS | Status: DC | PRN
  Administered 2022-10-16: 85 mL via INTRA_ARTERIAL

## 2022-10-16 MED ORDER — NITROGLYCERIN 1 MG/10 ML FOR IR/CATH LAB
INTRA_ARTERIAL | Status: DC | PRN
  Administered 2022-10-16 (×3): 200 ug via INTRA_ARTERIAL

## 2022-10-16 MED ORDER — SODIUM CHLORIDE 0.9% FLUSH: 3.0000 mL | Freq: Two times a day (BID) | INTRAVENOUS | Status: DC

## 2022-10-16 MED ORDER — MIDAZOLAM HCL 2 MG/2ML IJ SOLN
INTRAMUSCULAR | Status: AC
  Filled 2022-10-16: qty 2

## 2022-10-16 MED ORDER — HEPARIN SODIUM (PORCINE) 1000 UNIT/ML IJ SOLN
INTRAMUSCULAR | Status: AC
  Filled 2022-10-16: qty 10

## 2022-10-16 MED ORDER — SODIUM CHLORIDE 0.9% FLUSH: 3.0000 mL | INTRAVENOUS | Status: DC | PRN

## 2022-10-16 MED ORDER — HYDRALAZINE HCL 20 MG/ML IJ SOLN
10.0000 mg | Freq: Once | INTRAMUSCULAR | Status: AC
  Administered 2022-10-16: 10 mg via INTRAVENOUS
  Filled 2022-10-16: qty 1

## 2022-10-16 MED ORDER — NITROGLYCERIN 1 MG/10 ML FOR IR/CATH LAB
INTRA_ARTERIAL | Status: AC
  Filled 2022-10-16: qty 10

## 2022-10-16 MED ORDER — CLOPIDOGREL BISULFATE 75 MG PO TABS
75.0000 mg | ORAL_TABLET | Freq: Every day | ORAL | Status: DC
Start: 2022-10-17 — End: 2022-10-17

## 2022-10-16 MED ORDER — SODIUM CHLORIDE 0.9 % IV SOLN: INTRAVENOUS | Status: DC

## 2022-10-16 MED ORDER — SODIUM CHLORIDE 0.9 % IV SOLN: 250.0000 mL | INTRAVENOUS | Status: DC | PRN

## 2022-10-16 MED ORDER — FENTANYL CITRATE (PF) 100 MCG/2ML IJ SOLN
INTRAMUSCULAR | Status: AC
  Filled 2022-10-16: qty 2

## 2022-10-16 MED ORDER — MIDAZOLAM HCL 2 MG/2ML IJ SOLN
INTRAMUSCULAR | Status: DC | PRN
  Administered 2022-10-16 (×2): 1 mg via INTRAVENOUS

## 2022-10-16 MED ORDER — HEPARIN SODIUM (PORCINE) 1000 UNIT/ML IJ SOLN
INTRAMUSCULAR | Status: DC | PRN
  Administered 2022-10-16: 1000 [IU] via INTRAVENOUS
  Administered 2022-10-16: 4500 [IU] via INTRAVENOUS

## 2022-10-16 MED ORDER — HEPARIN (PORCINE) IN NACL 1000-0.9 UT/500ML-% IV SOLN
INTRAVENOUS | Status: DC | PRN
  Administered 2022-10-16 (×2): 500 mL

## 2022-10-16 MED ORDER — ONDANSETRON HCL 4 MG/2ML IJ SOLN: 4.0000 mg | Freq: Four times a day (QID) | INTRAMUSCULAR | Status: DC | PRN

## 2022-10-16 MED ORDER — LIDOCAINE HCL (PF) 1 % IJ SOLN
INTRAMUSCULAR | Status: AC
  Filled 2022-10-16: qty 30

## 2022-10-16 MED ORDER — ACETAMINOPHEN 325 MG PO TABS: 650.0000 mg | ORAL_TABLET | ORAL | Status: DC | PRN

## 2022-10-16 MED ORDER — SODIUM CHLORIDE 0.9 % WEIGHT BASED INFUSION: 1.0000 mL/kg/h | INTRAVENOUS | Status: DC

## 2022-10-16 MED ORDER — LIDOCAINE HCL (PF) 1 % IJ SOLN
INTRAMUSCULAR | Status: DC | PRN
  Administered 2022-10-16: 20 mL

## 2022-10-16 MED ORDER — FENTANYL CITRATE (PF) 100 MCG/2ML IJ SOLN
INTRAMUSCULAR | Status: DC | PRN
  Administered 2022-10-16: 50 ug via INTRAVENOUS
  Administered 2022-10-16: 25 ug via INTRAVENOUS

## 2022-10-16 MED ORDER — DIPHENHYDRAMINE HCL 50 MG/ML IJ SOLN
25.0000 mg | INTRAMUSCULAR | Status: AC
  Administered 2022-10-16: 25 mg via INTRAVENOUS
  Filled 2022-10-16: qty 1

## 2022-10-16 MED ORDER — METHYLPREDNISOLONE SODIUM SUCC 125 MG IJ SOLR
125.0000 mg | INTRAMUSCULAR | Status: AC
  Administered 2022-10-16: 125 mg via INTRAVENOUS
  Filled 2022-10-16: qty 2

## 2022-10-16 MED ORDER — HYDRALAZINE HCL 20 MG/ML IJ SOLN: 5.0000 mg | INTRAMUSCULAR | Status: DC | PRN

## 2022-10-16 SURGICAL SUPPLY — 25 items
BALLN COYOTE ES OTW 3X40X145 (BALLOONS) ×2
BALLN COYOTE OTW 2.5X100X150 (BALLOONS) ×2
BALLOON COYOTE ES OTW 3X40X145 (BALLOONS) IMPLANT
BALLOON COYOTE OTW 2.5X100X150 (BALLOONS) IMPLANT
CATH AURYON ATHERECTOMY 0.9 (CATHETERS) IMPLANT
CATH NAVICROSS ANG 65CM (CATHETERS) IMPLANT
CATH OMNI FLUSH 5F 65CM (CATHETERS) IMPLANT
CATH RUBICON 014 150 (CATHETERS) IMPLANT
CATHETER NAVICROSS ANG 65CM (CATHETERS) ×2
DEVICE TORQUE SEADRAGON GRN (MISCELLANEOUS) IMPLANT
DEVICE VASC CLSR CELT ART 5 (Vascular Products) IMPLANT
GUIDEWIRE ANGLED .035X150CM (WIRE) IMPLANT
KIT ENCORE 26 ADVANTAGE (KITS) IMPLANT
KIT MICROPUNCTURE NIT STIFF (SHEATH) IMPLANT
KIT PV (KITS) ×2 IMPLANT
SHEATH CATAPULT 5FR 60 (SHEATH) IMPLANT
SHEATH PINNACLE 5F 10CM (SHEATH) IMPLANT
SHEATH PROBE COVER 6X72 (BAG) IMPLANT
STOPCOCK MORSE 400PSI 3WAY (MISCELLANEOUS) IMPLANT
SYR MEDRAD MARK 7 150ML (SYRINGE) ×2 IMPLANT
TRANSDUCER W/STOPCOCK (MISCELLANEOUS) ×2 IMPLANT
TRAY PV CATH (CUSTOM PROCEDURE TRAY) ×2 IMPLANT
TUBING CIL FLEX 10 FLL-RA (TUBING) IMPLANT
WIRE BENTSON .035X145CM (WIRE) IMPLANT
WIRE SPARTACORE .014X300CM (WIRE) IMPLANT

## 2022-10-16 NOTE — Progress Notes (Signed)
Pt ambulated to and from bathroom to void with no signs of oozing from left groin site  

## 2022-10-16 NOTE — Progress Notes (Signed)
Report received from Vivian,RN.  Care assumed at this time

## 2022-10-16 NOTE — Progress Notes (Signed)
Patient and husband was given discharge instructions. Both verbalized understanding. 

## 2022-10-16 NOTE — Op Note (Signed)
Patient name: [name] MRN: [id number] DOB: 24-Nov-1940 Sex: female  10/16/2022 Pre-operative Diagnosis: Right foot ulcer Post-operative diagnosis:  Same Surgeon:  [name] Procedure Performed:  1.  Ultrasound-guided access, left femoral artery  2.  Abdominal aortogram  3.  Right leg angiogram  4.  Contrast injections with catheter in the right superficial femoral artery and right anterior tibial artery  5.  Laser atherectomy, right anterior tibial artery  6.  Angioplasty, right anterior tibial artery  7.  Intra-arterial injection of nitroglycerin into the anterior tibial artery  8.  Conscious sedation, 83 minutes  9.  Closure device, Celt   Indications: This is an 82 year old female with nonhealing right leg wound who comes in today for angiography and possible intervention  Procedure:  The patient was identified in the holding area and taken to room 8.  The patient was then placed supine on the table and prepped and draped in the usual sterile fashion.  A time out was called.  Conscious sedation was administered with the use of IV fentanyl and Versed under continuous physician and nurse monitoring.  Heart rate, blood pressure, and oxygen saturation were continuously monitored.  Total sedation time was 83 minutes.  Ultrasound was used to evaluate the left common femoral artery.  It was patent .  A digital ultrasound image was acquired.  A micropuncture needle was used to access the left common femoral artery under ultrasound guidance.  An 018 wire was advanced without resistance and a micropuncture sheath was placed.  The 018 wire was removed and a benson wire was placed.  The micropuncture sheath was exchanged for a 5 french sheath.  An omniflush catheter was advanced over the wire to the level of L-1.  An abdominal angiogram was obtained.  Next, using the omniflush catheter and a benson wire, the aortic bifurcation was crossed and the catheter was placed into theright external iliac  artery and right runoff was obtained.    Findings:   Aortogram: No significant renal artery stenosis.  Patent common and external carotids patent.  Bilateral common femoral arteries widely patent.  Right Lower Extremity: Right common femoral and profundofemoral artery are widely patent.  The superficial femoral artery is diffusely diseased with multiple areas of 50% or less stenosis.  The anterior tibial artery is the dominant runoff vessel.  There are 2 tandem greater than 80% stenosis in the midportion and a ostial lesion.  There is also a short segment greater than 50% stenosis in the peroneal artery  Left Lower Extremity: Not evaluated  Intervention: After the above images were acquired, the decision made to proceed with intervention.  A 5 French 60 cm sheath was placed into the right superficial femoral artery.  The patient was fully heparinized.  Next using a angled Rubicon catheter, the anterior tibial artery was selected and a 014 [place] core was advanced across the lesions.  I selected a 0.9 mm laser catheter and perform laser atherectomy of the anterior tibial artery making 2 passes down the back.  I then used a 2.5 x 100 coyote balloon for balloon angioplasty.  Follow-up imaging did not opacified to the anterior tibial artery.  I suspected that this was likely secondary to spasm.  Therefore I injected 400 mcg of nitroglycerin directly into the anterior tibial artery.  I also felt that there was ostial lesions that needed to be addressed and so these were treated with a 3 x 40 balloon.  Final imaging showed no residual stenosis.  Because of her propensity for spasm, I elected not to address the peroneal artery at this time.  I elected to close the groin with a Celt  Impression:  #1  Successful laser atherectomy angioplasty using a 2.5 mm balloon to tandem greater than 80% right anterior tibial artery lesions  #2  Tibial vessels are small and were quick to spasm requiring nitroglycerin.  Because  of this I elected to observe for a focal short segment peroneal lesion.  #3  Diffuse disease throughout the superficial femoral artery with multiple lesions in the 40-50% category.  If the patient continues to have difficulty with wound healing, a decision will need to be made regarding whether or not to treat all of these lesions, some of which are below the knee    V. [name], M.D., [facility] [place] Office: [phone] Pager:  [phone]

## 2022-10-16 NOTE — Progress Notes (Signed)
Report ginen to Lindsey,RN who will assume care at this time

## 2022-10-16 NOTE — Progress Notes (Signed)
Assumed care of pt at this time.

## 2022-10-16 NOTE — Discharge Instructions (Signed)
Restart Eliquis tomorrow am.

## 2022-10-16 NOTE — Interval H&P Note (Signed)
History and Physical Interval Note:  10/16/2022 1:16 PM  [name]  has presented today for surgery, with the diagnosis of ischemia of right lower extremity with ulceration of ankle.  The various methods of treatment have been discussed with the patient and family. After consideration of risks, benefits and other options for treatment, the patient has consented to  Procedure(s): ABDOMINAL AORTOGRAM W/LOWER EXTREMITY (N/A) as a surgical intervention.  The patient's history has been reviewed, patient examined, no change in status, stable for surgery.  I have reviewed the patient's chart and labs.  Questions were answered to the patient's satisfaction.     [name]

## 2022-10-17 ENCOUNTER — Encounter (HOSPITAL_COMMUNITY): Payer: Self-pay | Admitting: Surgery

## 2022-10-18 ENCOUNTER — Telehealth: Payer: Self-pay | Admitting: Neurology

## 2022-10-18 NOTE — Telephone Encounter (Signed)
Pts spouse is calling in to see if we can tell them where they can get weighted gloves for persons with tremors.  Pt spouse would like to have a call back.

## 2022-10-19 ENCOUNTER — Telehealth: Payer: Self-pay | Admitting: Cardiovascular Disease

## 2022-10-19 NOTE — Telephone Encounter (Signed)
This can be a side effect of amio, not ideal if pt cannot feed herself due to tremors - would defer to MD for potential change in antiarrhythmic med.

## 2022-10-19 NOTE — Telephone Encounter (Signed)
Patient's husband called about patient's amiodarone causing patient's tremors to worsen, were patient is unable to feed herself due to the tremors. They have talked to Dr. Arbutus Leas and she stated in recent phone encounter -  "[name], Colleen Batty, DO  to [name], [place]    10/19/22 11:27 AM This is a discussion with her an Dr. Eden Emms.  He tried to d/c once and it wasn't possible.  Sometimes lowering the dosage does help with tremor and sometimes not without d/c it (which isn't an option per cardiology)"    Will send to Dr. Eden Emms and our PharmD to see if they have any suggestions.

## 2022-10-19 NOTE — Progress Notes (Signed)
Results will be discussed more in November 02, 2022 visit.  Xxxxxx  IMPRESSION: 11 mm cavitary lesion in the left lower lobe, non FDG avid. While this may be infectious/inflammatory, a low-grade neoplasm is not excluded. Follow-up CT chest is suggested in 3-6 months.  No findings suspicious for metastatic disease.   Electronically Signed   By: Charline Bills M.D.   On: 09/12/2022 00:05

## 2022-10-19 NOTE — Telephone Encounter (Signed)
Patient wanted know if Dr. Joya Salm cut back on patient Amiodarone if they will be something to help with her tremors. I had sent in the RX for [employer] and insurance did not cover and gloves were 700$ I told patients husband again about [place] he wants somewhere local and I recommended contacting  medical supply store like [facility] to see what they have

## 2022-10-19 NOTE — Telephone Encounter (Signed)
Called patient and left message on voice mail.

## 2022-10-19 NOTE — Telephone Encounter (Signed)
Patient is returning a call to chelsea 

## 2022-10-19 NOTE — Telephone Encounter (Signed)
Pt c/o medication issue:  1. Name of Medication: amiodarone (PACERONE) 200 MG tablet  2. How are you currently taking this medication (dosage and times per day)?  Take 1 tablet (200 mg total) by mouth 2 (two) times daily.       3. Are you having a reaction (difficulty breathing--STAT)? No  4. What is your medication issue? Pt's spouse is requesting a callback regarding this medication causing pt to have very bad tremors and she can barely feed herself. Please advise.

## 2022-10-22 MED ORDER — AMIODARONE HCL 200 MG PO TABS: 100.0000 mg | ORAL_TABLET | ORAL | Status: DC

## 2022-10-22 NOTE — Telephone Encounter (Addendum)
Called patient's husband back about message. Informed him that Dr. Eden Emms recommended amiodarone 100 mg by mouth every other day. Patient's husband stated he would try that and see if that works.

## 2022-10-22 NOTE — Addendum Note (Signed)
Addended by: Virl Axe, Rufus Cypert L on: 10/22/2022 01:59 PM   Modules accepted: Orders

## 2022-10-24 ENCOUNTER — Other Ambulatory Visit: Payer: Self-pay

## 2022-10-29 ENCOUNTER — Other Ambulatory Visit: Payer: Self-pay | Admitting: *Deleted

## 2022-10-29 ENCOUNTER — Other Ambulatory Visit: Payer: Self-pay | Admitting: Cardiovascular Disease

## 2022-10-29 DIAGNOSIS — I70233 Atherosclerosis of native arteries of right leg with ulceration of ankle: Secondary | ICD-10-CM

## 2022-11-02 ENCOUNTER — Encounter: Payer: Self-pay | Admitting: Internal Medicine

## 2022-11-02 ENCOUNTER — Ambulatory Visit (INDEPENDENT_AMBULATORY_CARE_PROVIDER_SITE_OTHER): Payer: Medicare HMO | Admitting: Internal Medicine

## 2022-11-02 VITALS — BP 110/80 | HR 60 | Ht 62.0 in | Wt 97.2 lb

## 2022-11-02 DIAGNOSIS — J449 Chronic obstructive pulmonary disease, unspecified: Secondary | ICD-10-CM

## 2022-11-02 DIAGNOSIS — R911 Solitary pulmonary nodule: Secondary | ICD-10-CM | POA: Diagnosis not present

## 2022-11-02 NOTE — Progress Notes (Signed)
OV 11/22/2015  Chief Complaint  Patient presents with   Follow-up    Pt states that she tries to take the Sprivia Resp daily - sometimes she forgets. Denies current breathing issues. Pt states that she does not have to use the Albuterol HFA.    82 year old female former smoker with significant neuromuscular disability due to prior stroke, spinal issues.    STUDIES:   CT chest (05/2014) Masslike area of consolidation RLL, r hilar and mediastinal LAN CT chest (06/2014) Significant improvement of RLL opacification, cavitary lesion persists suggestive of resolving PNA with necrosis.   CT chest (09/2014) Near complete collapse of the right lower lobe cavitary lesion with residual pleural-parenchymal thickening. New fine ground-glass nodules in the right middle lobe consistent with pulmonary infection. CT chest (02/15/2015) Recurrent to rounded masslike area of consolidation in the right lower lobe. Given that this area was present previously then resolved and has recurrent quickly, this is most compatible with rounded pneumonia. Small right pleural effusion with adjacent right lower lobe atelectasis or consolidation. Mildly enlarged precarinal lymph node, likely reactive. CT chest 04/13/15 >Interval improvement in masslike consolidation in the right lower lobe, without complete resolution, favoring resolving pneumonia. 2. Locule of air at the lateral base of the right hemi thorax may be within necrotic lung. Bronchopleural fistula cannot be definitively excluded. 3. Small, partially loculated right pleural effusion. 4. 4 mm left lower lobe nodule, stable.    PCP Colleen Pizza, MD REferrd by dr Eden Emms HPI  IOV 07/09/2014  Chief Complaint  Patient presents with   Pulmonary Consult    Pt referred by Dr. Eden Emms fpr abnormal CT. Pt denies SOB, cough, and CP/tightness. Pt stated she went to hospital for pna on 05/20/14.     82 year old female with significant neuromuscular disability due to  prior stroke, spinal issues. Previous greater than 50 pack smoker. Was admitted around 05/20/2014 with significant right lower lobe pneumonia/huge masslike appearance on CT scan of the chest. Apparently she was extensively asymptomatic at this point in time. Treated with antibiotics. She had follow-up CT scan of the chest which shows significant improvement in the right lower lobe opacity but with a residual 5.2 cm thick-walled cavitary lesion. There for she's here for follow-up. At baseline she reports no dyspnea but then she is extremely disabled and is only able to move a little bit with pushing a wheelchair and this is because of a spinal issues. She only rarely feels short of breath and manages with pro-air when necessary. She's never had lung function test in the past. She denies any cough. Currently she feels better     OV 09/29/2014  Chief Complaint  Patient presents with   Follow-up    Pt here after CT scan. Pt states her breathing is unchanged since last OV. Pt has left ankle edema dt recent sprain, pt is seeing PCP for the issue.     Follow-up   - Right lower lobe lung cavity following pneumonia February 2016: She had CT scan of the chest June 2016. This showed near resolution of the cavity and it is down to a scar tissue. I personally visualized image dated 09/08/2014  - New issue: CT scan of the chest 09/08/2014 compared to CT scans of the chest March 2016 show some new right middle lobe groundglass nodules that are extremely small. She denies she was sick at that time  - Other issue: History of smoking with shortness of breath: She denies any shortness  of breath or cough but on deeper questioning she does admit to occasional shortness of breath that is very rare. She uses pro-air may be once every 2 weeks. She feels good. She's never had pulmonary function testing. She has a history of 56 pack smoking history but is quit. She does not want to pulmonary function test now but will do  it in the future   06/03/2015 Acute OV : Cavitary PNA  Pt presents for work in visit. Was seen 1 month ago . She is recovering from recurrent cavitary PNA on the right.  Previous episode in 05/2014 with right sided mass like consolidation. She was treated with abx. Serial CT showed near complete resolution on CT chest 09/2014.  Admitted in Nov 2016 with acute GI bleed and cavitary lung lesion. EGD showed a normal esophagus and gastritis with numerous clean, linear ulcers of the stomach and a few ulcers of the duodenal bulb. She did stabilize and was transitioned back on Eliquis 5mg  Twice daily   Prior to discharge CT chest showed a recurrent rounded masslike area of consolidation on the right lower lobe measuring 9.5 x 6.7 cm.  She was treated with aggressive ABX . Follow up CT chest in Jan showed interval improvement in RLL consolidation. Clinically she has been slowly improving.  She is very frail and has previous stroke years ago. She is on chronic pain meds as well for back issues.  She denies any swallow issues but does admit she eats in bed often. Says this week has not felt as good  Says that her right side was hurting when she lied down in bed and not as much energy. Feels much better today. CXR today shows further improvement in RLL opacity . No new areas noted.  She denies any bleeding , n/v/d , fever or chest pain. , orthopnea, hemoptysis , or edema.  Has ov with PCP next week for labs.  Last labs last month w/ improved H/H .    OV 07/12/2015  Chief Complaint  Patient presents with   Follow-up    Pt last seen on 06/03/15 for pna. Pt states she is feeling well now. Pt deines SOB, cough, wheezing, CP/tightness, f/c/s.     Follow-up recurrent pneumonia. Most recently 6 weeks ago she saw nurse practitioner. She says she is now recovered from respiratory infection. She and her husband state that she gets recurrent respiratory infection and is looking at ways to prevent it. Pulmonary  function test today shows gold stage II COPD. CT scan chest as a follow-up from pneumonia 2 years ago shows that things have resolved to scar tissue. There is no lung mass. Overall she's asymptomatic from a respiratory standpoint at baseline but then she is largely disabled because of spinal issues and does not exert much.  CT and PFT were personally visualized  Pulmonary function test 07/08/2015 FEV1 1.44 L/70%. No post bronchodilator response. FVC 2.1 L/76% and a ratio 69 consistent with Gold stage II COPD. Total lung capacity is 98%. DLCO is reduced at 11.33/46%  Ct Chest Wo Contrast  07/11/2015  CLINICAL DATA:  Lung nodules. EXAM: CT CHEST WITHOUT CONTRAST TECHNIQUE: Multidetector CT imaging of the chest was performed following the standard protocol without IV contrast. COMPARISON:  CT chest exams dating back to 01/15/2008. FINDINGS: Mediastinum/Nodes: Sub cm low-attenuation lesion in the right lobe of the thyroid is incidentally noted. Mediastinal lymph nodes are not enlarged by CT size criteria. Hilar regions are difficult to definitively evaluate without IV  contrast. No axillary adenopathy. Surgical clips in the left axilla. Atherosclerotic calcification of the arterial vasculature. Dense calcification and narrowing of the right brachiocephalic artery. Three-vessel coronary artery calcification. Heart size normal. No pericardial effusion. Small hiatal hernia. Calcified subcarinal lymph node. Lungs/Pleura: Right apical pleural parenchymal scarring. Mild centrilobular emphysema. Post infectious scarring in the right middle and right lower lobes. A few scattered pulmonary nodules measure 4 mm or less in size, unchanged. Some are calcified. No pleural fluid. Airway is unremarkable. Upper abdomen: Visualized portions of the liver, adrenal glands unremarkable. Probable renal vascular calcifications bilaterally. Difficult to exclude renal stones as well. Visualized portions of the spleen, pancreas and stomach  are grossly unremarkable. Musculoskeletal: Degenerative changes are seen in the spine. No worrisome lytic or sclerotic lesions. Lower thoracic compression fractures, as before. IMPRESSION: 1. Post infectious scarring in the right middle and right lower lobes. 2. Three-vessel coronary artery calcification. 3. Advanced atherosclerotic calcification and narrowing of the proximal right brachiocephalic artery. Electronically Signed   By: Leanna Battles M.D.   On: 07/11/2015 11:25    OV 11/22/2015  Chief Complaint  Patient presents with   Follow-up    Pt states that she tries to take the Sprivia Resp daily - sometimes she forgets. Denies current breathing issues. Pt states that she does not have to use the Albuterol HFA.     Follow-up Gold stage II COPD in the setting of neuromuscular issues due to spine and stroke. Currently on Spiriva.she is here with her husband. Overall she's doing well. She never uses albuterol for rescue since her last visit. No interim exacerbations or urgent care visits. Sleeps well. She's not interested in pulmonary rehabilitation. She continues to Spiriva. She takes flu shot in the fall and is willing to do so again.    has a past medical history of Anemia (05/2014.); Atrial fibrillation (HCC); Breast cancer (HCC); Carotid artery occlusion; Carotid bruit; Degenerative arthritis of spine (2015); Hyperlipidemia; Hypertension; Meningioma (HCC); Osteoporosis; Pneumonia (May 19, 2014); Stroke [facility]); Subclavian steal syndrome; and Ulcers of both lower extremities (HCC) (2015).   reports that she quit smoking about 9 years ago. Her smoking use included Cigarettes. She has a 56.00 pack-year smoking history. She has never used smokeless tobacco.   OV 06/27/2016  Chief Complaint  Patient presents with   Follow-up    pt states she is doing well at this time, denies any breathing complaints.     Follow-up moderate COPD on single agent Spiriva. Last seen August 2017. She is here  with her husband. In the interim no COPD exacerbations of prednisone use or antibody use or admissions to the hospital emergency room visits for any reason. She is followed up with the gastroenterologist and neurologist for previous histories of atherosclerosis and GI bleeds. She is here with her husband. She did admit that she is not fully compliant with the Spiriva but she stable.   OV 07/11/2017  Chief Complaint  Patient presents with   Follow-up    1-year follow up.  Pt stated she fell 05/10/17 and was off of her feet x6 weeks. Pt has SOB with exertion. States she has been noncompliant when it comes to using her inhalers or neb machine.     Follow-up moderate COPD on single agent Spiriva and a wheelchair-bound female  There is a 1 year follow-up.  Since her last visit overall stable but earlier this year had hairline fracture in her left tibia.  And was then bedbound and also wheelchair-bound.  She has a brace.  She has had routine cardiology follow-up.  In terms of her respiratory status she is stable.  However she says she missed taking her Spiriva inhalers for many months.  She does get some exertional dyspnea but only when there is too much exertion but for because she is wheelchair-bound and bedbound she does not feel it.  But she does recognize the need to take Spiriva on a regular basis.  She is up-to-date with her vaccines.   Marland Kitchen CAT COPD Symptom & Quality of Life Score (GSK trademark) 0 is no burden. 5 is highest burden 07/11/2017   Never Cough -> Cough all the time 0  No phlegm in chest -> Chest is full of phlegm 0  No chest tightness -> Chest feels very tight 0  No dyspnea for 1 flight stairs/hill -> Very dyspneic for 1 flight of stairs 3  No limitations for ADL at home -> Very limited with ADL at home 5  Confident leaving home -> Not at all confident leaving home 0  Sleep soundly -> Do not sleep soundly because of lung condition 0  Lots of Energy -> No energy at all 3  TOTAL  Score (max 40)  11      OV 12/22/2019  Subjective:  Patient ID: [name], female , DOB: 1940-10-14 , age 82 y.o. , MRN: [id number] , ADDRESS: [address] [place] [facility] [address]   12/22/2019 -   Chief Complaint  Patient presents with   Follow-up    breathing about the same     HPI [name] 82 y.o. -Personally not seen her in 2 years.  She is on Spiriva.  Last visit was in 2019 with me.  Then in 2020 she saw nurse practitioner.  She continues to do well on Spiriva.  COPD CAT score is 3 and improved.  Her husband is here with her.  They both expressed that Spiriva sometimes can be very expensive when she runs into the donut hole and the cost is few $100.  They asked advice about how to handle this.  They also have questions about whether they should get a COVID-19 booster third shot.  She got vaccine second dose in February 2021 mRNA vaccine.  She also plans to have the flu shot with her primary care physician.  She knows that she needs to get the high-dose flu shot.  ROS - per HPI  IMPRESSION: CT  1. Post infectious scarring in the right middle and right lower lobes. 2. Three-vessel coronary artery calcification. 3. Advanced atherosclerotic calcification and narrowing of the proximal right brachiocephalic artery.     Electronically Signed   By: Leanna Battles M.D.   On: 07/11/2015 11:25    12/22/2019: OV with Dr. Marchelle Gearing.  Doing well on Spiriva; however concerned that it can be quite expensive when she runs into the donut hole.  She was provided with 2 samples and advised to call in the future if she had any issues with affordability.  Discussed getting third COVID booster, which she was encouraged to do so given her long history.  10/25/2021: OV with Cobb NP for overdue follow-up.  She reports that she has been feeling well.  She has no concerns or complaints regarding her breathing.  She really does not notice any significant shortness of breath.  No limits in her  daily activities.  Her husband reports that she walks laps around him at the grocery store.  She is on  Spiriva daily, which works well for her.  She never uses her albuterol inhaler.  She has not required prednisone or antibiotics for acute exacerbations of her breathing.  04/27/2022: Today - follow up Patient presents today for 15-month follow-up.  She was recently admitted 03/05/2022 to the hospital for sepsis secondary to left foot cellulitis.  She was treated with IV vancomycin and ceftriaxone and transition to doxycycline and Augmentin to complete a 10-day course.  She was discharged home on 03/09/2022 after clinically improving.  Today, she tells me that she is feeling much better.  Feels like her breathing is at her baseline.  No significant concerns or complaints.  She does not have any cough, associated wheezing, or chest congestion.  Her insurance notified her that they will no longer be covering her Spiriva.  She needs an alternative inhaler for this.  Rare use of albuterol.    OV 08/21/2022  Subjective:  Patient ID: [name], female , DOB: [birth year] , age 61 y.o. , MRN: [id number] , ADDRESS: [address] [place] [facility] [address] PCP Benita Stabile, MD Patient Care Team: Benita Stabile, MD as PCP - General (Internal Medicine) Wendall Stade, MD as PCP - Cardiology (Cardiology) Wendall Stade, MD (Cardiology) Darreld Mclean, MD as Consulting Physician (Orthopedic Surgery) Kellie Shropshire, PT (Inactive) as Physical Therapist (Physical Therapy)  This Provider for this visit: Treatment Team:  Attending Provider: Kalman Shan, MD    08/21/2022 -   Chief Complaint  Patient presents with   Follow-up    F/up on CT scan    HPI [name] 82 y.o. -personally not seen her in some years.  After that earlier this year she saw a nurse practitioner.  However after that she has had 3 admissions in 2024.  The first 1 was in February 2024 for acute heart failure with preserved  ejection fraction exacerbation.  Then the second 1 mid March 2024 for sepsis and acute respiratory failure with hypoxemia.  During this admission the CT scan of the chest that I personally visualized did show bilateral effusions and also consolidation.  However she did have a left lower lobe cavity that was new compared to 2017.  Then in April 2024 again another admission for acute on chronic heart failure with reduced ejection fraction.  Currently she is euvolemic.  However she is on Eliquis and amiodarone for atrial fibrillation and she fell last week and bruised her entire left face and even the left chest.  She is here with her husband.  He is an independent historian.  They tell me right now respiratory status is stable but the dealing with vascular ischemia of the lower extremities.  She had a CT scan of the chest Aug 16, 2022 that I personally visualized.  The effusions have resolved but there is a left lower lobe cavity that is somewhat thick-walled.  There is the same cavity that was seen in March 2024 and is unchanged.  It is definitely new from years prior.  She is here to discuss this.  COPD itself she feels is stable.  Her husband is hard of hearing.  Both of them needed repeated explanation.  She did indicate to me that she is absolutely not interested in lung biopsy..  I did indicate to them these nodules very likely early stage lung cancer but other differential diagnosis could be involved.  She does not want lung biopsy.  She also does not want lobectomy.  We discussed radiation therapy  for lung cancer she is willing to have this.  We talked about doing additional investigations that are noninvasive is way to enhance the pretest probability for lung cancer.  This involves blood test for QuantiFERON gold vasculitis and PET scan and also research protocol involving a nasal swab for genomic classifier for lung cancer.  She is interested in all this.  Gave her the copy of the consent form.  Other than  that she did indicate that she has anxiety about an upcoming MRI.  She wanted to know if she could take an anxiolytic.  I advised her that she can take a low-dose Xanax.  Her FEV1 a few years ago was acceptable.    Narrative & Impression  CLINICAL DATA:  Follow-up abnormal chest x-ray   EXAM: CT CHEST WITHOUT CONTRAST   TECHNIQUE: Multidetector CT imaging of the chest was performed following the standard protocol without IV contrast.   RADIATION DOSE REDUCTION: This exam was performed according to the departmental dose-optimization program which includes automated exposure control, adjustment of the mA and/or kV according to patient size and/or use of iterative reconstruction technique.   COMPARISON:  CT chest, abdomen and pelvis dated June 18, 2022; chest x-ray dated July 09, 2022   FINDINGS: Cardiovascular: Cardiomegaly. No pericardial effusion. Normal caliber thoracic aorta with severe calcified plaque. Severe left main and three-vessel coronary artery calcifications.   Mediastinum/Nodes: Moderate hiatal hernia. Esophagus is unremarkable. No enlarged lymph nodes seen in the chest. Mildly enlarged right lower paratracheal lymph node measuring 12 mm on series 2, image 60, unchanged when compared with the prior exam.   Lungs/Pleura: Central airways are patent. Linear opacity of the right lung apex, consistent with pleural-parenchymal scarring. Moderate centrilobular emphysema. Mild diffuse ground-glass opacities and septal thickening. Solid cavitary nodule of the left lower lobe measuring 12 mm on series 4, image 109, unchanged when compared with the prior exam. Additional small scattered solid pulmonary nodules are seen, example 5 mm nodule of the superior portion of the right lower lobe located on image 79, unchanged when compared with the prior. Scattered basilar linear opacities which are likely due to scarring or atelectasis. Trace left pleural effusion.   Upper  Abdomen: No acute abnormality.   Musculoskeletal: Severe compression deformities of T10 and T12 with retropulsion, unchanged when compared with the prior exam. No acute osseous abnormality.   IMPRESSION: 1. Persistent cavitary nodule of the left lower lobe, concerning for primary lung malignancy. Additional imaging evaluation or consultation with Pulmonology or Thoracic Surgery is recommended. 2. Mild pulmonary edema and trace left pleural effusion. 3. Cardiomegaly with severe left main and three-vessel coronary artery calcifications. 4. Moderate hiatal hernia. 5. Aortic Atherosclerosis (ICD10-I70.0) and Emphysema (ICD10-J43.9).     Electronically Signed   By: Allegra Lai M.D.   On: 08/17/2022 10:29    OV 11/02/2022  Subjective:  Patient ID: [name], female , DOB: 1940-11-22 , age 39 y.o. , MRN: [id number] , ADDRESS: [address] [place] [facility] [address] PCP Benita Stabile, MD Patient Care Team: Benita Stabile, MD as PCP - General (Internal Medicine) Wendall Stade, MD as PCP - Cardiology (Cardiology) Wendall Stade, MD (Cardiology) Darreld Mclean, MD as Consulting Physician (Orthopedic Surgery) Kellie Shropshire, PT (Inactive) as Physical Therapist (Physical Therapy)  This Provider for this visit: Treatment Team:  Attending Provider: Kalman Shan, MD    11/02/2022 -   Chief Complaint  Patient presents with   Follow-up    F/up, no new complaints.     #  Former smoker #Moderate COPD  #Frail with lean BMI. #11 mm left lower lobe cavitary nodule -new March 2024 and persistent but PET - June 2024 #Admission April 2024 with CODE STATUS DNR.  HPI BETHA WIGREN 82 y.o. -presents for follow-up.  Presents with her husband.  #COPD: This is stable she is on Incruse no complaints  #Left lower lobe cavitary nodule: This appears new in March 2024.  PET scan in June 2024 showed it was persistent with very low uptake.  Follow-up CT scan is recommended but she has  refused.  She does not want to know what the diagnosis is.  She knows it might be early stage lung cancer.  She does not want radiation or chemo even if the side effects are low.  She says she has had a good life and she is thankful she has a great husband.  Therefore she does not even want follow-up CT.  Other issues - Nonhealing wound right ankle:'s been going on and off for 3 months.  She has wound home care. -She had admission in April 2024 for acute on chronic heart failure with reduced ejection fraction. -Also July 2024 critical limb ischemia addressed by Dr. Myra Gianotti    NM PET Scan June 2024   IMPRESSION: 11 mm cavitary lesion in the left lower lobe, non FDG avid. While this may be infectious/inflammatory, a low-grade neoplasm is not excluded. Follow-up CT chest is suggested in 3-6 months.   No findings suspicious for metastatic disease.     Electronically Signed   By: Charline Bills M.D.   On: 09/12/2022 00:05  PFT     Latest Ref Rng & Units 07/08/2015    7:53 AM  ILD indicators  FVC-Pre L 2.09   FVC-Predicted Pre % 76   FVC-Post L 1.96   FVC-Predicted Post % 71   TLC L 4.96   TLC Predicted % 98   DLCO uncorrected ml/min/mmHg 11.33   DLCO UNC %Pred % 46       LAB RESULTS last 96 hours No results found.  LAB RESULTS last 90 days Recent Results (from the past 2160 hour(s))  VAS [place] ABI WITH/WO TBI     Status: None   Collection Time: 08/20/22 11:32 AM  Result Value Ref Range   Right ABI 1.02    Left ABI 1.01   I-STAT, chem 8     Status: Abnormal   Collection Time: 08/28/22  5:52 AM  Result Value Ref Range   Sodium 138 135 - 145 mmol/L   Potassium 4.5 3.5 - 5.1 mmol/L   Chloride 100 98 - 111 mmol/L   BUN 35 (H) 8 - 23 mg/dL   Creatinine, Ser 7.82 (H) 0.44 - 1.00 mg/dL   Glucose, Bld 89 70 - 99 mg/dL    Comment: Glucose reference range applies only to samples taken after fasting for at least 8 hours.   Calcium, Ion 1.27 1.15 - 1.40 mmol/L   TCO2 26 22  - 32 mmol/L   Hemoglobin 12.2 12.0 - 15.0 g/dL   HCT 95.6 21.3 - 08.6 %  POCT Activated clotting time     Status: None   Collection Time: 08/28/22  8:35 AM  Result Value Ref Range   Activated Clotting Time 244 seconds    Comment: Reference range 74-137 seconds for patients not on anticoagulant therapy.  VAS [place] ABI WITH/WO TBI     Status: None   Collection Time: 10/08/22 10:41 AM  Result Value Ref Range  Right ABI 0.65    Left ABI 1.11   I-STAT, chem 8     Status: Abnormal   Collection Time: 10/16/22 12:03 PM  Result Value Ref Range   Sodium 140 135 - 145 mmol/L   Potassium 5.3 (H) 3.5 - 5.1 mmol/L   Chloride 105 98 - 111 mmol/L   BUN 40 (H) 8 - 23 mg/dL   Creatinine, Ser 0.27 (H) 0.44 - 1.00 mg/dL   Glucose, Bld 92 70 - 99 mg/dL    Comment: Glucose reference range applies only to samples taken after fasting for at least 8 hours.   Calcium, Ion 1.21 1.15 - 1.40 mmol/L   TCO2 32 22 - 32 mmol/L   Hemoglobin 11.9 (L) 12.0 - 15.0 g/dL   HCT 25.3 (L) 66.4 - 40.3 %  POCT Activated clotting time     Status: None   Collection Time: 10/16/22  2:20 PM  Result Value Ref Range   Activated Clotting Time 250 seconds    Comment: Reference range 74-137 seconds for patients not on anticoagulant therapy.         has a past medical history of Anemia (05/2014.), Atrial fibrillation (HCC), Breast cancer [facility]), Carotid artery occlusion, Carotid bruit, Degenerative arthritis of spine (2015), Hyperlipidemia, Hypertension, Meningioma (HCC), Osteoporosis, Pneumonia (May 19, 2014), Stroke [facility]), Subclavian steal syndrome, and Ulcers of both lower extremities (HCC) (2015).   reports that she quit smoking about 16 years ago. Her smoking use included cigarettes. She started smoking about 72 years ago. She has a 56 pack-year smoking history. She has never been exposed to tobacco smoke. She has never used smokeless tobacco.  Past Surgical History:  Procedure Laterality Date   ABDOMINAL AORTOGRAM  W/LOWER EXTREMITY N/A 12/19/2021   Procedure: ABDOMINAL AORTOGRAM W/LOWER EXTREMITY;  Surgeon: Nada Libman, MD;  [facility];  Service: Cardiovascular;  Laterality: N/A;   ABDOMINAL AORTOGRAM W/LOWER EXTREMITY Bilateral 08/28/2022   Procedure: ABDOMINAL AORTOGRAM W/LOWER EXTREMITY;  Surgeon: Nada Libman, MD;  Location: [facility];  Service: Cardiovascular;  Laterality: Bilateral;   ABDOMINAL AORTOGRAM W/LOWER EXTREMITY N/A 10/16/2022   Procedure: ABDOMINAL AORTOGRAM W/LOWER EXTREMITY;  Surgeon: Nada Libman, MD;  [facility]: [facility];  Service: Cardiovascular;  Laterality: N/A;   AMPUTATION TOE Left 12/29/2021   Procedure: Incisional DEBRIDEMENT Left Foot with Amputation Left Great Toe;  Surgeon: Candelaria Stagers, DPM;  Location: [facility] OR;  Service: Podiatry;  Laterality: Left;   CARDIAC CATHETERIZATION     CATARACT EXTRACTION Bilateral 2013   COLONOSCOPY  11/17/2010   Procedure: COLONOSCOPY;  Surgeon: Malissa Hippo, MD;  Location: [facility];  Service: Endoscopy;  Laterality: N/A;  10:45 am   ESOPHAGOGASTRODUODENOSCOPY  11/17/2010   Procedure: ESOPHAGOGASTRODUODENOSCOPY (EGD);  Surgeon: Malissa Hippo, MD;  Location: [facility];  Service: Endoscopy;  Laterality: N/A;   ESOPHAGOGASTRODUODENOSCOPY N/A 02/16/2015   Procedure: ESOPHAGOGASTRODUODENOSCOPY (EGD);  Surgeon: Ruffin Frederick, MD;  Location: [facility] ENDOSCOPY;  Service: Gastroenterology;  Laterality: N/A;   EXPLORATORY LAPAROTOMY  1960s   For peritonitis of undetermined cause   EYE SURGERY     LUMBAR EPIDURAL INJECTION  06-2012--06-2013   pt. states she has had 5 epidurals in 06-2012----06-2013   MASTECTOMY Left 1990   PERIPHERAL VASCULAR ATHERECTOMY  08/28/2022   Procedure: PERIPHERAL VASCULAR ATHERECTOMY;  Surgeon: Nada Libman, MD;  [facility];  Service: Cardiovascular;;  Lt Popliteal and AT   PERIPHERAL VASCULAR BALLOON ANGIOPLASTY  08/28/2022   Procedure: PERIPHERAL  VASCULAR BALLOON ANGIOPLASTY;  Surgeon: Nada Libman, MD;  [facility];  Service: Cardiovascular;;  Lt Popliteal and AT   PERIPHERAL VASCULAR BALLOON ANGIOPLASTY  10/16/2022   Procedure: PERIPHERAL VASCULAR BALLOON ANGIOPLASTY;  Surgeon: Nada Libman, MD;  [facility];  Service: Cardiovascular;;  Rt AT   PERIPHERAL VASCULAR INTERVENTION Left 12/19/2021   Procedure: PERIPHERAL VASCULAR INTERVENTION;  Surgeon: Nada Libman, MD;  [facility];  Service: Cardiovascular;  Laterality: Left;  SFA and PTA of POP   YAG LASER APPLICATION Right 05/03/2015   Procedure: YAG LASER APPLICATION;  Surgeon: Jethro Bolus, MD;  Location: [facility];  Service: Ophthalmology;  Laterality: Right;  right   YAG LASER APPLICATION Left 05/24/2015   Procedure: YAG LASER APPLICATION;  Surgeon: Jethro Bolus, MD;  Location: [facility];  Service: Ophthalmology;  Laterality: Left;    Allergies  Allergen Reactions   Iodine Rash and Other (See Comments)    BETADINE Rash/burning, blisters on skin. Burns skin   Iohexol Rash and Other (See Comments)    Blisters; PT NEEDS 13-HOUR PREP    Sulfa Antibiotics Nausea And Vomiting   Petroleum Gauze Non-Woven 3x9" [Wound Dressings] Other (See Comments)    BURNING SENSATION, RED SKIN   Povidone-Iodine Other (See Comments)    Burns skin   Wound Dressing Adhesive Other (See Comments)    Burns skin   Tape Itching, Rash and Other (See Comments)    "DO NOT USE ADHESIVE TAPE" Not even a Band Aid" NO PAPER TAPE Burns skin Skin is very very thin    Immunization History  Administered Date(s) Administered   Influenza Split 01/07/2014   Influenza, High Dose Seasonal PF 01/28/2016, 01/07/2017, 12/17/2018, 01/16/2022   Influenza,inj,Quad PF,6+ Mos 01/12/2015   Influenza,inj,quad, With Preservative 12/16/2020   Influenza-Unspecified 01/07/2013, 01/01/2022, 01/16/2022   Moderna Sars-Covid-2 Vaccination 05/02/2019, 06/01/2019    Pneumococcal Conjugate-13 01/07/2014   Pneumococcal Polysaccharide-23 01/07/2017   Rsv, Bivalent, Protein Subunit Rsvpref,pf Verdis Frederickson) 03/23/2022   Tdap 08/01/2011   Zoster Recombinant(Shingrix) 01/10/2017    Family History  Problem Relation Age of Onset   Alzheimer's disease Mother    Dementia Mother    Hypertension Mother    Hyperlipidemia Mother    Parkinsonism Father      Current Outpatient Medications:    AMBULATORY NON FORMULARY MEDICATION, Dispense one Readi Steadi glove, Disp: 1 Units, Rfl: 0   amiodarone (PACERONE) 200 MG tablet, Take 0.5 tablets (100 mg total) by mouth every other day., Disp: , Rfl:    apixaban (ELIQUIS) 2.5 MG TABS tablet, Take 1 tablet (2.5 mg total) by mouth 2 (two) times daily., Disp: 180 tablet, Rfl: 1   ascorbic acid (VITAMIN C) 500 MG tablet, Take 500 mg by mouth in the morning., Disp: , Rfl:    CALCIUM PO, Take 1,200 mg by mouth 2 (two) times daily., Disp: , Rfl:    Camphor-Menthol-Methyl Sal (SALONPAS) 3.04-14-08 % PTCH, Place 1 patch onto the skin daily as needed (pain.)., Disp: , Rfl:    Cholecalciferol (VITAMIN D-3) 25 MCG (1000 UT) CAPS, Take 1,000-2,000 tablets by mouth See admin instructions. Take 1000 units tablet by mouth in the morning & take 86578 units tablets by mouth at night., Disp: , Rfl:    clopidogrel (PLAVIX) 75 MG tablet, TAKE 1 TABLET EVERY MORNING, Disp: 90 tablet, Rfl: 2   denosumab (PROLIA) 60 MG/ML SOSY injection, Inject 60 mg into the skin every 6 (  six) months., Disp: , Rfl:    doxycycline (VIBRA-TABS) 100 MG tablet, Take 100 mg by mouth 2 (two) times daily., Disp: , Rfl:    Ferrous Sulfate (IRON PO), Take 65 mg by mouth at bedtime., Disp: , Rfl:    furosemide (LASIX) 20 MG tablet, Take 1 tablet (20 mg total) by mouth 2 (two) times daily. (Patient taking differently: Take 20 mg by mouth in the morning.), Disp: 30 tablet, Rfl: 0   MAGNESIUM OXIDE PO, Take 500 mg by mouth every morning., Disp: , Rfl:    metoprolol tartrate  (LOPRESSOR) 25 MG tablet, TAKE 3 TABLETS TWICE A DAY, Disp: 540 tablet, Rfl: 3   Multiple Vitamin (MULTIVITAMIN WITH MINERALS) TABS tablet, Take 1 tablet by mouth every morning., Disp: , Rfl:    pantoprazole (PROTONIX) 40 MG tablet, Take 1 tablet (40 mg total) by mouth daily., Disp: 90 tablet, Rfl: 2   potassium chloride (KLOR-CON M) 10 MEQ tablet, Take 1 tablet (10 mEq total) by mouth daily. (Patient taking differently: Take 10 mEq by mouth in the morning.), Disp: 30 tablet, Rfl: 2   pravastatin (PRAVACHOL) 40 MG tablet, TAKE 1 TABLET DAILY, Disp: 90 tablet, Rfl: 3   SANTYL 250 UNIT/GM ointment, APPLY TO AFFECTED AREAS ONCE DAILY. (Patient taking differently: Apply 1 Application topically daily.), Disp: 30 g, Rfl: 0   umeclidinium bromide (INCRUSE ELLIPTA) 62.5 MCG/ACT AEPB, Inhale 1 puff into the lungs daily., Disp: 90 each, Rfl: 3      Objective:   Vitals:   11/02/22 0929  BP: 110/80  Pulse: 60  SpO2: 97%  Weight: 97 lb 3.2 oz (44.1 kg)  Height: 5\' 2"  (1.575 m)    Estimated body mass index is 17.78 kg/m as calculated from the following:   Height as of this encounter: 5\' 2"  (1.575 m).   Weight as of this encounter: 97 lb 3.2 oz (44.1 kg).  @WEIGHTCHANGE @  [employer]   11/02/22 0929  Weight: 97 lb 3.2 oz (44.1 kg)     Physical Exam   General: No distress. frail O2 at rest: no Cane present: no Sitting in wheel chair: HAS one but now sitting in chair Frail: YES Obese: no Neuro: Alert and Oriented x 3. GCS 15. Speech normal Psych: Pleasant Resp:  Barrel Chest - no.  Wheeze - no, Crackles - YES, No overt respiratory distress CVS: Normal heart sounds. Murmurs - no Ext: Stigmata of Connective Tissue Disease - no HEENT: Normal upper airway. PEERL +. No post nasal drip        Assessment:       ICD-10-CM   1. COPD, moderate (HCC)  J44.9     2. Nodule of lower lobe of left lung  R91.1          Plan:     Patient Instructions     ICD-10-CM   1. COPD,  moderate (HCC)  J44.9     2. Nodule of lower lobe of left lung  R91.1           COPD, moderate (HCC)  -Stable COPD  Plan -Continue INCRUSE daily with albuterol as needed - high dose flu shot in fall  Lung nodule left lower lobe - cavitary  - new in mrch 2024 and persistent in May 2024. PET scan COLD in Jun2024. Lung risk is lower but not ruled out  - respect 0% desire for lung biopsy, radiation, chemo and even followup CT chest   Plan - expectant followup respecting your wishes -  Follow-up - Dr. Marchelle Gearing or nurse practitioner in the next 6 months   FOLLOWUP Return in about 6 months (around 05/05/2023) for 15 min visit, copd, with Dr Marchelle Gearing, Face to Face OR Video Visit.    SIGNATURE    Dr. Kalman Shan, M.D., F.C.C.P,  Pulmonary and Critical Care Medicine Staff Physician, [facility] Director - Interstitial Lung Disease  Program  Pulmonary Fibrosis [facility] [place], [place], [address]  Pager: [phone], If no answer or between  15:00h - 7:00h: call [phone] Telephone: [phone]  10:23 AM 11/02/2022

## 2022-11-02 NOTE — Patient Instructions (Addendum)
ICD-10-CM   1. COPD, moderate (HCC)  J44.9     2. Nodule of lower lobe of left lung  R91.1           COPD, moderate (HCC)  -Stable COPD  Plan -Continue INCRUSE daily with albuterol as needed - high dose flu shot in fall  Lung nodule left lower lobe - cavitary  - new in mrch 2024 and persistent in May 2024. PET scan COLD in Jun2024. Lung risk is lower but not ruled out  - respect 0% desire for lung biopsy, radiation, chemo and even followup CT chest   Plan - expectant followup respecting your wishes -   Follow-up - Dr. Marchelle Gearing or nurse practitioner in the next 6 months

## 2022-11-12 ENCOUNTER — Telehealth: Payer: Self-pay | Admitting: Pharmacy Technician

## 2022-11-12 NOTE — Telephone Encounter (Signed)
Auth Submission: APPROVED Site of care: Site of care: [facility] Payer: [employer] Medication & CPT/J Code(s) submitted: Prolia (Denosumab) [id number] Route of submission (phone, fax, portal): PORTAL Phone # Fax # Auth type: Buy/Bill PB Units/visits requested: X2 DOSES Reference number: [id number] Approval from: 11/11/22 to 11/11/23

## 2022-11-14 ENCOUNTER — Ambulatory Visit (INDEPENDENT_AMBULATORY_CARE_PROVIDER_SITE_OTHER): Payer: Medicare HMO | Admitting: Podiatry

## 2022-11-14 DIAGNOSIS — R600 Localized edema: Secondary | ICD-10-CM

## 2022-11-14 DIAGNOSIS — L97322 Non-pressure chronic ulcer of left ankle with fat layer exposed: Secondary | ICD-10-CM | POA: Diagnosis not present

## 2022-11-14 MED ORDER — AMOXICILLIN-POT CLAVULANATE 875-125 MG PO TABS
1.0000 | ORAL_TABLET | Freq: Two times a day (BID) | ORAL | 0 refills | Status: DC
Start: 2022-11-14 — End: 2022-11-18

## 2022-11-14 NOTE — Progress Notes (Signed)
Subjective:  Patient ID: [name], female    DOB: [birth year],  MRN: [id number]  Chief Complaint  Patient presents with   Wound Check     82 y.o. female returns for post-op check.  Patient states that she is doing okay.  She states that the top of the foot is still doing better.  Ankle wound is still present.  Wound care nurses helping  Review of Systems: Negative except as noted in the HPI. Denies N/V/F/Ch.  Past Medical History:  Diagnosis Date   Anemia 05/2014.   Atrial fibrillation (HCC)    x 3 yrs   Breast cancer (HCC)    S/P left mastectomy and chemotherapy 1989 remained in remission   Carotid artery occlusion    Carotid bruit    LICA 60-79% (duplex 9/11)   Degenerative arthritis of spine 2015   Chronic back pain   Hyperlipidemia    Hypertension    Meningioma (HCC)    Osteoporosis    Pneumonia May 19, 2014   Stroke [facility])    CVA 2008   Subclavian steal syndrome    Ulcers of both lower extremities (HCC) 2015    Current Outpatient Medications:    amoxicillin-clavulanate (AUGMENTIN) 875-125 MG tablet, Take 1 tablet by mouth 2 (two) times daily., Disp: 60 tablet, Rfl: 0   AMBULATORY NON FORMULARY MEDICATION, Dispense one Readi Steadi glove, Disp: 1 Units, Rfl: 0   amiodarone (PACERONE) 200 MG tablet, Take 0.5 tablets (100 mg total) by mouth every other day., Disp: , Rfl:    apixaban (ELIQUIS) 2.5 MG TABS tablet, Take 1 tablet (2.5 mg total) by mouth 2 (two) times daily., Disp: 180 tablet, Rfl: 1   ascorbic acid (VITAMIN C) 500 MG tablet, Take 500 mg by mouth in the morning., Disp: , Rfl:    CALCIUM PO, Take 1,200 mg by mouth 2 (two) times daily., Disp: , Rfl:    Camphor-Menthol-Methyl Sal (SALONPAS) 3.04-14-08 % PTCH, Place 1 patch onto the skin daily as needed (pain.)., Disp: , Rfl:    Cholecalciferol (VITAMIN D-3) 25 MCG (1000 UT) CAPS, Take 1,000-2,000 tablets by mouth See admin instructions. Take 1000 units tablet by mouth in the morning & take 10272 units  tablets by mouth at night., Disp: , Rfl:    clopidogrel (PLAVIX) 75 MG tablet, TAKE 1 TABLET EVERY MORNING, Disp: 90 tablet, Rfl: 2   denosumab (PROLIA) 60 MG/ML SOSY injection, Inject 60 mg into the skin every 6 (six) months., Disp: , Rfl:    doxycycline (VIBRA-TABS) 100 MG tablet, Take 100 mg by mouth 2 (two) times daily., Disp: , Rfl:    Ferrous Sulfate (IRON PO), Take 65 mg by mouth at bedtime., Disp: , Rfl:    furosemide (LASIX) 20 MG tablet, Take 1 tablet (20 mg total) by mouth 2 (two) times daily. (Patient taking differently: Take 20 mg by mouth in the morning.), Disp: 30 tablet, Rfl: 0   MAGNESIUM OXIDE PO, Take 500 mg by mouth every morning., Disp: , Rfl:    metoprolol tartrate (LOPRESSOR) 25 MG tablet, TAKE 3 TABLETS TWICE A DAY, Disp: 540 tablet, Rfl: 3   Multiple Vitamin (MULTIVITAMIN WITH MINERALS) TABS tablet, Take 1 tablet by mouth every morning., Disp: , Rfl:    pantoprazole (PROTONIX) 40 MG tablet, Take 1 tablet (40 mg total) by mouth daily., Disp: 90 tablet, Rfl: 2   potassium chloride (KLOR-CON M) 10 MEQ tablet, Take 1 tablet (10 mEq total) by mouth daily. (Patient taking differently: Take 10  mEq by mouth in the morning.), Disp: 30 tablet, Rfl: 2   pravastatin (PRAVACHOL) 40 MG tablet, TAKE 1 TABLET DAILY, Disp: 90 tablet, Rfl: 3   SANTYL 250 UNIT/GM ointment, APPLY TO AFFECTED AREAS ONCE DAILY. (Patient taking differently: Apply 1 Application topically daily.), Disp: 30 g, Rfl: 0   umeclidinium bromide (INCRUSE ELLIPTA) 62.5 MCG/ACT AEPB, Inhale 1 puff into the lungs daily., Disp: 90 each, Rfl: 3  Social History   Tobacco Use  Smoking Status Former   Current packs/day: 0.00   Average packs/day: 1 pack/day for 56.0 years (56.0 ttl pk-yrs)   Types: Cigarettes   Start date: 08/14/1950   Quit date: 08/14/2006   Years since quitting: 16.2   Passive exposure: Never  Smokeless Tobacco Never  Tobacco Comments   quit in 2008    Allergies  Allergen Reactions   Iodine Rash and  Other (See Comments)    BETADINE Rash/burning, blisters on skin. Burns skin   Iohexol Rash and Other (See Comments)    Blisters; PT NEEDS 13-HOUR PREP    Sulfa Antibiotics Nausea And Vomiting   Petroleum Gauze Non-Woven 3x9" [Wound Dressings] Other (See Comments)    BURNING SENSATION, RED SKIN   Povidone-Iodine Other (See Comments)    Burns skin   Wound Dressing Adhesive Other (See Comments)    Burns skin   Tape Itching, Rash and Other (See Comments)    "DO NOT USE ADHESIVE TAPE" Not even a Band Aid" NO PAPER TAPE Burns skin Skin is very very thin   Objective:   There were no vitals filed for this visit.  There is no height or weight on file to calculate BMI. Constitutional Well developed. Well nourished.  Vascular Foot warm and well perfused. Capillary refill normal to all digits.   Neurologic Normal speech. Oriented to person, place, and time. Epicritic sensation to light touch grossly present bilaterally.  Dermatologic  Left lateral ankle with fat layer exposed.  No signs of infection noted.  No malodor present does not probe down to bone.  Orthopedic: No tenderness to palpation noted about the surgical site.   Radiographs: None Assessment:   1. Ankle ulcer, left, with fat layer exposed (HCC)   2. Edema of left lower leg       Plan:  Patient was evaluated and treated and all questions answered.  S/p foot surgery left -Clinic healed.  No signs of dehiscence noted.  No complications noted.  Left dorsal foot wound with fat layer exposed -Clinically healed  Left lateral ankle with fat layer exposed -Continue Santyl wet-to-dry dressing.  Appears to be slowly getting better.  Will continue monitor the progression of it.  Minimal debridement was carried out with mechanical debridement. -Unna boot was applied in standard technique today -Patient has nursing coming to do Unna boots from now on.  Left generalized edema -Unna boot compression was applied in  standard technique No follow-ups on file.    No follow-ups on file.

## 2022-11-17 ENCOUNTER — Encounter (HOSPITAL_COMMUNITY): Payer: Self-pay

## 2022-11-17 ENCOUNTER — Observation Stay (HOSPITAL_COMMUNITY)
Admission: EM | Admit: 2022-11-17 | Discharge: 2022-11-18 | Disposition: A | Discharge status: Home / Self Care | Payer: Medicare HMO | Attending: Emergency Medicine | Admitting: Emergency Medicine

## 2022-11-17 ENCOUNTER — Emergency Department (HOSPITAL_COMMUNITY): Payer: Medicare HMO

## 2022-11-17 ENCOUNTER — Other Ambulatory Visit: Payer: Self-pay

## 2022-11-17 DIAGNOSIS — R7989 Other specified abnormal findings of blood chemistry: Secondary | ICD-10-CM | POA: Diagnosis present

## 2022-11-17 DIAGNOSIS — Z87891 Personal history of nicotine dependence: Secondary | ICD-10-CM | POA: Diagnosis not present

## 2022-11-17 DIAGNOSIS — R55 Syncope and collapse: Secondary | ICD-10-CM | POA: Diagnosis present

## 2022-11-17 DIAGNOSIS — N1832 Chronic kidney disease, stage 3b: Secondary | ICD-10-CM | POA: Diagnosis present

## 2022-11-17 DIAGNOSIS — Z79899 Other long term (current) drug therapy: Secondary | ICD-10-CM | POA: Insufficient documentation

## 2022-11-17 DIAGNOSIS — Z8673 Personal history of transient ischemic attack (TIA), and cerebral infarction without residual deficits: Secondary | ICD-10-CM | POA: Insufficient documentation

## 2022-11-17 DIAGNOSIS — J449 Chronic obstructive pulmonary disease, unspecified: Secondary | ICD-10-CM | POA: Diagnosis not present

## 2022-11-17 DIAGNOSIS — I5043 Acute on chronic combined systolic (congestive) and diastolic (congestive) heart failure: Principal | ICD-10-CM | POA: Insufficient documentation

## 2022-11-17 DIAGNOSIS — Z853 Personal history of malignant neoplasm of breast: Secondary | ICD-10-CM | POA: Insufficient documentation

## 2022-11-17 DIAGNOSIS — Z7901 Long term (current) use of anticoagulants: Secondary | ICD-10-CM | POA: Insufficient documentation

## 2022-11-17 DIAGNOSIS — Z7902 Long term (current) use of antithrombotics/antiplatelets: Secondary | ICD-10-CM | POA: Diagnosis not present

## 2022-11-17 DIAGNOSIS — I509 Heart failure, unspecified: Secondary | ICD-10-CM

## 2022-11-17 DIAGNOSIS — I130 Hypertensive heart and chronic kidney disease with heart failure and stage 1 through stage 4 chronic kidney disease, or unspecified chronic kidney disease: Secondary | ICD-10-CM | POA: Diagnosis not present

## 2022-11-17 DIAGNOSIS — I5023 Acute on chronic systolic (congestive) heart failure: Secondary | ICD-10-CM | POA: Diagnosis not present

## 2022-11-17 DIAGNOSIS — S91009A Unspecified open wound, unspecified ankle, initial encounter: Secondary | ICD-10-CM | POA: Diagnosis present

## 2022-11-17 DIAGNOSIS — I4819 Other persistent atrial fibrillation: Secondary | ICD-10-CM | POA: Diagnosis not present

## 2022-11-17 DIAGNOSIS — I739 Peripheral vascular disease, unspecified: Secondary | ICD-10-CM | POA: Diagnosis present

## 2022-11-17 DIAGNOSIS — R791 Abnormal coagulation profile: Secondary | ICD-10-CM | POA: Diagnosis not present

## 2022-11-17 LAB — CBC
HCT: 31.6 % — ABNORMAL LOW (ref 36.0–46.0)
Hemoglobin: 10.4 g/dL — ABNORMAL LOW (ref 12.0–15.0)
MCH: 33.4 pg (ref 26.0–34.0)
MCHC: 32.9 g/dL (ref 30.0–36.0)
MCV: 101.6 fL — ABNORMAL HIGH (ref 80.0–100.0)
Platelets: 250 10*3/uL (ref 150–400)
RBC: 3.11 MIL/uL — ABNORMAL LOW (ref 3.87–5.11)
RDW: 15.7 % — ABNORMAL HIGH (ref 11.5–15.5)
WBC: 10.3 10*3/uL (ref 4.0–10.5)
nRBC: 0 % (ref 0.0–0.2)

## 2022-11-17 LAB — BRAIN NATRIURETIC PEPTIDE: B Natriuretic Peptide: 2137 pg/mL — ABNORMAL HIGH (ref 0.0–100.0)

## 2022-11-17 LAB — BASIC METABOLIC PANEL
Anion gap: 10 (ref 5–15)
BUN: 32 mg/dL — ABNORMAL HIGH (ref 8–23)
CO2: 25 mmol/L (ref 22–32)
Calcium: 9.2 mg/dL (ref 8.9–10.3)
Chloride: 106 mmol/L (ref 98–111)
Creatinine, Ser: 1.32 mg/dL — ABNORMAL HIGH (ref 0.44–1.00)
GFR, Estimated: 40 mL/min — ABNORMAL LOW (ref >60)
Glucose, Bld: 89 mg/dL (ref 70–99)
Potassium: 3.9 mmol/L (ref 3.5–5.1)
Sodium: 141 mmol/L (ref 135–145)

## 2022-11-17 LAB — MAGNESIUM: Magnesium: 2.1 mg/dL (ref 1.7–2.4)

## 2022-11-17 LAB — D-DIMER, QUANTITATIVE: D-Dimer, Quant: 0.98 ug/mL-FEU — ABNORMAL HIGH (ref 0.00–0.50)

## 2022-11-17 MED ORDER — CLOPIDOGREL BISULFATE 75 MG PO TABS
75.0000 mg | ORAL_TABLET | Freq: Every morning | ORAL | Status: DC
  Administered 2022-11-18: 75 mg via ORAL
  Filled 2022-11-17: qty 1

## 2022-11-17 MED ORDER — FUROSEMIDE 10 MG/ML IJ SOLN
40.0000 mg | Freq: Two times a day (BID) | INTRAMUSCULAR | Status: DC
  Administered 2022-11-18: 40 mg via INTRAVENOUS
  Filled 2022-11-17: qty 4

## 2022-11-17 MED ORDER — FUROSEMIDE 10 MG/ML IJ SOLN
40.0000 mg | Freq: Once | INTRAMUSCULAR | Status: AC
  Administered 2022-11-17: 40 mg via INTRAVENOUS
  Filled 2022-11-17: qty 4

## 2022-11-17 MED ORDER — HYDRALAZINE HCL 25 MG PO TABS: 25.0000 mg | ORAL_TABLET | Freq: Four times a day (QID) | ORAL | Status: DC | PRN

## 2022-11-17 MED ORDER — ACETAMINOPHEN 325 MG PO TABS: 650.0000 mg | ORAL_TABLET | Freq: Four times a day (QID) | ORAL | Status: DC | PRN

## 2022-11-17 MED ORDER — PANTOPRAZOLE SODIUM 40 MG PO TBEC
40.0000 mg | DELAYED_RELEASE_TABLET | Freq: Every day | ORAL | Status: DC
  Administered 2022-11-18: 40 mg via ORAL
  Filled 2022-11-17: qty 1

## 2022-11-17 MED ORDER — POTASSIUM CHLORIDE CRYS ER 20 MEQ PO TBCR
20.0000 meq | EXTENDED_RELEASE_TABLET | Freq: Once | ORAL | Status: AC
  Administered 2022-11-17: 20 meq via ORAL
  Filled 2022-11-17: qty 1

## 2022-11-17 MED ORDER — AMIODARONE HCL 200 MG PO TABS
100.0000 mg | ORAL_TABLET | ORAL | Status: DC
  Administered 2022-11-18: 100 mg via ORAL
  Filled 2022-11-17: qty 1

## 2022-11-17 MED ORDER — SODIUM CHLORIDE 0.9% FLUSH
3.0000 mL | Freq: Two times a day (BID) | INTRAVENOUS | Status: DC
  Administered 2022-11-17 – 2022-11-18 (×2): 3 mL via INTRAVENOUS

## 2022-11-17 MED ORDER — APIXABAN 2.5 MG PO TABS
2.5000 mg | ORAL_TABLET | Freq: Two times a day (BID) | ORAL | Status: DC
  Administered 2022-11-17 – 2022-11-18 (×2): 2.5 mg via ORAL
  Filled 2022-11-17 (×2): qty 1

## 2022-11-17 MED ORDER — ACETAMINOPHEN 650 MG RE SUPP: 650.0000 mg | Freq: Four times a day (QID) | RECTAL | Status: DC | PRN

## 2022-11-17 MED ORDER — UMECLIDINIUM BROMIDE 62.5 MCG/ACT IN AEPB
1.0000 | INHALATION_SPRAY | Freq: Every day | RESPIRATORY_TRACT | Status: DC
  Administered 2022-11-18: 1 via RESPIRATORY_TRACT
  Filled 2022-11-17: qty 7

## 2022-11-17 MED ORDER — PRAVASTATIN SODIUM 40 MG PO TABS
40.0000 mg | ORAL_TABLET | Freq: Every day | ORAL | Status: DC
  Administered 2022-11-18: 40 mg via ORAL
  Filled 2022-11-17: qty 1

## 2022-11-17 MED ORDER — METOPROLOL TARTRATE 50 MG PO TABS
75.0000 mg | ORAL_TABLET | Freq: Two times a day (BID) | ORAL | Status: DC
  Administered 2022-11-17 – 2022-11-18 (×2): 75 mg via ORAL
  Filled 2022-11-17 (×2): qty 1

## 2022-11-17 NOTE — H&P (Signed)
History and Physical    [name] GMW:[id number] DOB: 05-17-1940 DOA: 11/17/2022  PCP: Benita Stabile, MD   Patient coming from: Home   Chief Complaint: SOB   HPI: [name] is a pleasant 82 y.o. female with medical history significant for COPD, CKD 3B, atrial fibrillation on Eliquis, left lung nodule followed by pulmonology, PAD status post left lower extremity atherectomy and bilateral lower extremity balloon angioplasty, and ankle ulcer who presents to the emergency department with worsening shortness of breath.  Patient reports feeling short of breath this morning and was only able to find relief when sitting up.  Symptoms continued to worsen throughout the course of the day.  She denies any fevers, chest pain, or cough.  ED Course: Upon arrival to the ED, patient is found to be afebrile and saturating mid 80s on room air with mild tachypnea and elevated blood pressure.  Chest x-ray is notable for cardiomegaly with pulmonary vascular congestion and small bilateral pleural effusions.  Labs are most notable for creatinine 1.32, BNP 2137, and D-dimer 0.98.  Patient was treated with 40 mg IV Lasix and 20 mEq oral potassium in the ED.  Review of Systems:  All other systems reviewed and apart from HPI, are negative.  Past Medical History:  Diagnosis Date   Anemia 05/2014.   Atrial fibrillation (HCC)    x 3 yrs   Breast cancer (HCC)    S/P left mastectomy and chemotherapy 1989 remained in remission   Carotid artery occlusion    Carotid bruit    LICA 60-79% (duplex 9/11)   Degenerative arthritis of spine 2015   Chronic back pain   Hyperlipidemia    Hypertension    Meningioma (HCC)    Osteoporosis    Pneumonia May 19, 2014   Stroke [facility])    CVA 2008   Subclavian steal syndrome    Ulcers of both lower extremities (HCC) 2015    Past Surgical History:  Procedure Laterality Date   ABDOMINAL AORTOGRAM W/LOWER EXTREMITY N/A 12/19/2021   Procedure: ABDOMINAL AORTOGRAM  W/LOWER EXTREMITY;  Surgeon: Nada Libman, MD;  Location: [facility];  Service: Cardiovascular;  Laterality: N/A;   ABDOMINAL AORTOGRAM W/LOWER EXTREMITY Bilateral 08/28/2022   Procedure: ABDOMINAL AORTOGRAM W/LOWER EXTREMITY;  Surgeon: Nada Libman, MD;  Location: [facility];  Service: Cardiovascular;  Laterality: Bilateral;   ABDOMINAL AORTOGRAM W/LOWER EXTREMITY N/A 10/16/2022   Procedure: ABDOMINAL AORTOGRAM W/LOWER EXTREMITY;  Surgeon: Nada Libman, MD;  Location: [facility];  Service: Cardiovascular;  Laterality: N/A;   AMPUTATION TOE Left 12/29/2021   Procedure: Incisional DEBRIDEMENT Left Foot with Amputation Left Great Toe;  Surgeon: Candelaria Stagers, DPM;  Location: [facility] OR;  Service: Podiatry;  Laterality: Left;   CARDIAC CATHETERIZATION     CATARACT EXTRACTION Bilateral 2013   COLONOSCOPY  11/17/2010   Procedure: COLONOSCOPY;  Surgeon: Malissa Hippo, MD;  Location: [facility];  Service: Endoscopy;  Laterality: N/A;  10:45 am   ESOPHAGOGASTRODUODENOSCOPY  11/17/2010   Procedure: ESOPHAGOGASTRODUODENOSCOPY (EGD);  Surgeon: Malissa Hippo, MD;  Location: [facility];  Service: Endoscopy;  Laterality: N/A;   ESOPHAGOGASTRODUODENOSCOPY N/A 02/16/2015   Procedure: ESOPHAGOGASTRODUODENOSCOPY (EGD);  Surgeon: Ruffin Frederick, MD;  Location: [facility] ENDOSCOPY;  Service: Gastroenterology;  Laterality: N/A;   EXPLORATORY LAPAROTOMY  1960s   For peritonitis of undetermined cause   EYE SURGERY     LUMBAR EPIDURAL INJECTION  06-2012--06-2013   pt. states she  has had 5 epidurals in 06-2012----06-2013   MASTECTOMY Left 1990   PERIPHERAL VASCULAR ATHERECTOMY  08/28/2022   Procedure: PERIPHERAL VASCULAR ATHERECTOMY;  Surgeon: Nada Libman, MD;  [facility]: [facility];  Service: Cardiovascular;;  Lt Popliteal and AT   PERIPHERAL VASCULAR BALLOON ANGIOPLASTY  08/28/2022   Procedure: PERIPHERAL VASCULAR BALLOON ANGIOPLASTY;  Surgeon: Nada Libman, MD;   [facility]: [facility];  Service: Cardiovascular;;  Lt Popliteal and AT   PERIPHERAL VASCULAR BALLOON ANGIOPLASTY  10/16/2022   Procedure: PERIPHERAL VASCULAR BALLOON ANGIOPLASTY;  Surgeon: Nada Libman, MD;  [facility]: [facility];  Service: Cardiovascular;;  Rt AT   PERIPHERAL VASCULAR INTERVENTION Left 12/19/2021   Procedure: PERIPHERAL VASCULAR INTERVENTION;  Surgeon: Nada Libman, MD;  [facility]: [facility];  Service: Cardiovascular;  Laterality: Left;  SFA and PTA of POP   YAG LASER APPLICATION Right 05/03/2015   Procedure: YAG LASER APPLICATION;  Surgeon: Jethro Bolus, MD;  Location: [facility];  Service: Ophthalmology;  Laterality: Right;  right   YAG LASER APPLICATION Left 05/24/2015   Procedure: YAG LASER APPLICATION;  Surgeon: Jethro Bolus, MD;  Location: [facility];  Service: Ophthalmology;  Laterality: Left;    Social History:   reports that she quit smoking about 16 years ago. Her smoking use included cigarettes. She started smoking about 72 years ago. She has a 56 pack-year smoking history. She has never been exposed to tobacco smoke. She has never used smokeless tobacco. She reports that she does not drink alcohol and does not use drugs.  Allergies  Allergen Reactions   Iodine Rash and Other (See Comments)    BETADINE Rash/burning, blisters on skin. Burns skin   Iohexol Rash and Other (See Comments)    Blisters; PT NEEDS 13-HOUR PREP    Sulfa Antibiotics Nausea And Vomiting   Petroleum Gauze Non-Woven 3x9" [Wound Dressings] Other (See Comments)    BURNING SENSATION, RED SKIN   Povidone-Iodine Other (See Comments)    Burns skin   Wound Dressing Adhesive Other (See Comments)    Burns skin   Tape Itching, Rash and Other (See Comments)    "DO NOT USE ADHESIVE TAPE" Not even a Band Aid" NO PAPER TAPE Burns skin Skin is very very thin    Family History  Problem Relation Age of Onset   Alzheimer's disease Mother    Dementia Mother    Hypertension Mother     Hyperlipidemia Mother    Parkinsonism Father      Prior to Admission medications   Medication Sig Start Date End Date Taking? Authorizing Provider  AMBULATORY NON FORMULARY MEDICATION Dispense one Readi Steadi glove 09/25/22   Tat, Octaviano Batty, DO  amiodarone (PACERONE) 200 MG tablet Take 0.5 tablets (100 mg total) by mouth every other day. 10/22/22   Wendall Stade, MD  amoxicillin-clavulanate (AUGMENTIN) 875-125 MG tablet Take 1 tablet by mouth 2 (two) times daily. 11/14/22   Candelaria Stagers, DPM  apixaban (ELIQUIS) 2.5 MG TABS tablet Take 1 tablet (2.5 mg total) by mouth 2 (two) times daily. 07/23/22   Wendall Stade, MD  ascorbic acid (VITAMIN C) 500 MG tablet Take 500 mg by mouth in the morning.    [provider]  CALCIUM PO Take 1,200 mg by mouth 2 (two) times daily.    [provider]  Camphor-Menthol-Methyl Sal (SALONPAS) 3.04-14-08 % PTCH Place 1 patch onto the skin daily as needed (pain.).    [provider]  Cholecalciferol (VITAMIN D-3) 25 MCG (1000 UT) CAPS Take 1,000-2,000 tablets by mouth See admin instructions. Take 1000 units tablet by mouth in the morning & take 09811 units tablets by mouth at night.    [provider]  clopidogrel (PLAVIX) 75 MG tablet TAKE 1 TABLET EVERY MORNING 10/30/22   Wendall Stade, MD  denosumab (PROLIA) 60 MG/ML SOSY injection Inject 60 mg into the skin every 6 (six) months.    Benita Stabile, MD  doxycycline (VIBRA-TABS) 100 MG tablet Take 100 mg by mouth 2 (two) times daily. 11/01/22   [provider]  Ferrous Sulfate (IRON PO) Take 65 mg by mouth at bedtime.    [provider]  furosemide (LASIX) 20 MG tablet Take 1 tablet (20 mg total) by mouth 2 (two) times daily. Patient taking differently: Take 20 mg by mouth in the morning. 07/10/22   Hughie Closs, MD  MAGNESIUM OXIDE PO Take 500 mg by mouth every morning.    [provider]  metoprolol tartrate (LOPRESSOR) 25 MG tablet TAKE 3 TABLETS  TWICE A DAY 10/06/21   Wendall Stade, MD  Multiple Vitamin (MULTIVITAMIN WITH MINERALS) TABS tablet Take 1 tablet by mouth every morning.    [provider]  pantoprazole (PROTONIX) 40 MG tablet Take 1 tablet (40 mg total) by mouth daily. 10/12/22   Wendall Stade, MD  potassium chloride (KLOR-CON M) 10 MEQ tablet Take 1 tablet (10 mEq total) by mouth daily. Patient taking differently: Take 10 mEq by mouth in the morning. 05/26/22   Johnson, Clanford L, MD  pravastatin (PRAVACHOL) 40 MG tablet TAKE 1 TABLET DAILY 08/27/22   Wendall Stade, MD  SANTYL 250 UNIT/GM ointment APPLY TO AFFECTED AREAS ONCE DAILY. Patient taking differently: Apply 1 Application topically daily. 03/30/22   Candelaria Stagers, DPM  umeclidinium bromide (INCRUSE ELLIPTA) 62.5 MCG/ACT AEPB Inhale 1 puff into the lungs daily. 06/25/22   Kalman Shan, MD    Physical Exam: Vitals:   11/17/22 1900 11/17/22 2000 11/17/22 2015 11/17/22 2100  BP: (!) 161/77 (!) 148/71 (!) 172/69 (!) 171/78  Pulse: 62 62 64 63  Resp: (!) 23 (!) 29 20 17   Temp:      TempSrc:      SpO2: 90% 90% (!) 85% 96%  Weight:      Height:        Constitutional: NAD, calm, frail-appearing   Eyes: PERTLA, lids and conjunctivae normal ENMT: Mucous membranes are moist. Posterior pharynx clear of any exudate or lesions.   Neck: supple, no masses  Respiratory: no wheezing. No accessory muscle use.  Cardiovascular: S1 & S2 heard, regular rate and rhythm. RLE edema.  Abdomen: No distension, no tenderness, soft. Bowel sounds active.  Musculoskeletal: no clubbing / cyanosis. No joint deformity upper and lower extremities.   Skin: no significant rashes, lesions, ulcers. Warm, dry, well-perfused. Neurologic: CN 2-12 grossly intact. Moving all extremities. Alert and oriented.  Psychiatric: Pleasant. Cooperative.    Labs and Imaging on Admission: I have personally reviewed following labs and imaging studies  CBC: Recent Labs  Lab 11/17/22 1809   WBC 10.3  HGB 10.4*  HCT 31.6*  MCV 101.6*  PLT 250   Basic Metabolic Panel: Recent Labs  Lab 11/17/22 1809  NA 141  K 3.9  CL 106  CO2 25  GLUCOSE 89  BUN 32*  CREATININE 1.32*  CALCIUM 9.2  MG 2.1   GFR: Estimated Creatinine Clearance: 22.1 mL/min (A) (by C-G  formula based on SCr of 1.32 mg/dL (H)). Liver Function Tests: No results for input(s): "AST", "ALT", "ALKPHOS", "BILITOT", "PROT", "ALBUMIN" in the last 168 hours. No results for input(s): "LIPASE", "AMYLASE" in the last 168 hours. No results for input(s): "AMMONIA" in the last 168 hours. Coagulation Profile: No results for input(s): "INR", "PROTIME" in the last 168 hours. Cardiac Enzymes: No results for input(s): "CKTOTAL", "CKMB", "CKMBINDEX", "TROPONINI" in the last 168 hours. BNP (last 3 results) No results for input(s): "PROBNP" in the last 8760 hours. HbA1C: No results for input(s): "HGBA1C" in the last 72 hours. CBG: No results for input(s): "GLUCAP" in the last 168 hours. Lipid Profile: No results for input(s): "CHOL", "HDL", "LDLCALC", "TRIG", "CHOLHDL", "LDLDIRECT" in the last 72 hours. Thyroid Function Tests: No results for input(s): "TSH", "T4TOTAL", "FREET4", "T3FREE", "THYROIDAB" in the last 72 hours. Anemia Panel: No results for input(s): "VITAMINB12", "FOLATE", "FERRITIN", "TIBC", "IRON", "RETICCTPCT" in the last 72 hours. Urine analysis:    Component Value Date/Time   COLORURINE AMBER (A) 03/06/2022 0520   APPEARANCEUR CLEAR 03/06/2022 0520   LABSPEC 1.025 03/06/2022 0520   PHURINE 6.0 03/06/2022 0520   GLUCOSEU NEGATIVE 03/06/2022 0520   HGBUR NEGATIVE 03/06/2022 0520   BILIRUBINUR NEGATIVE 03/06/2022 0520   KETONESUR 5 (A) 03/06/2022 0520   PROTEINUR 100 (A) 03/06/2022 0520   UROBILINOGEN 0.2 02/15/2015 0417   NITRITE NEGATIVE 03/06/2022 0520   LEUKOCYTESUR NEGATIVE 03/06/2022 0520   Sepsis Labs: @LABRCNTIP (procalcitonin:4,lacticidven:4) )No results found for this or any  previous visit (from the past 240 hour(s)).   Radiological Exams on Admission: DG Chest Portable 1 View  Result Date: 11/17/2022 CLINICAL DATA:  Shortness of breath. EXAM: PORTABLE CHEST 1 VIEW COMPARISON:  Two-view chest x-ray 07/09/2022 FINDINGS: THE: FINDINGS: THE The heart is mildly enlarged. Changes of COPD are again noted. Scarring present. Small bilateral pleural effusions present. Pulmonary vascular congestion is present without frank edema. IMPRESSION: 1. Cardiomegaly and pulmonary vascular congestion without frank edema. 2. Small bilateral pleural effusions. 3. COPD. Electronically Signed   By: Marin Roberts M.D.   On: 11/17/2022 17:42    EKG: Independently reviewed. Sinus rhythm, LVH.   Assessment/Plan   1. Acute on chronic HFmrEF; acute hypoxic respiratory failure  - EF was 40-45% on TTE from February 2024  - Continue diuresis with IV Lasix, monitor weight and I/Os, monitor renal function and electrolytes    2. RLE swelling; elevated D-dimer  - D-dimer mildly elevated  - Check LLE venous doppler, continue Eliquis    3. COPD  - Not in exacerbation on admission  - Continue inhalers    4. CKD 3B  - Appears close to baseline on admission  - Renally-dose medications and monitor daily chem panel while diuresing    5. PAD  - No acute ischemia  - Continue Plavix and statin     DVT prophylaxis: Eliquis  Code Status: DNR  Level of Care: Level of care: Telemetry Family Communication: Husband updated in ED  Disposition Plan:  Patient is from: home  Anticipated d/c is to: TBD Anticipated d/c date is: 11/20/22  Patient currently: Pending improved volume status  Consults called: None  Admission status: Inpatient     Briscoe Deutscher, MD [facility]  11/17/2022, 9:47 PM

## 2022-11-17 NOTE — ED Triage Notes (Signed)
Pt from home with hx of CHF, reports SOB, started this morning and has gotten worse throughout day, pt says she takes Lasix once daily, med list in epic says supposed to be taking 2 times day, spouse says no one has ever told him this. Pt able to speak full sentences. Pt 93-94% on room air, not on oxygen at home

## 2022-11-17 NOTE — ED Notes (Addendum)
Pt placed on O2 @ 3L to maintain sats above 90%.

## 2022-11-17 NOTE — ED Provider Notes (Signed)
[place] EMERGENCY DEPARTMENT AT [facility] Provider Note   CSN: [id number] Arrival date & time: 11/17/22  1523     History  Chief Complaint  Patient presents with   Shortness of Breath    [name] is a 82 y.o. female with a history including hypertension, history of diastolic CHF, hyperlipidemia, atrial fibrillation on Eliquis and Plavix also on Lasix 40 mg daily which she has taken today, but woke this morning with mild shortness of breath which has progressively worsened.  Her shortness of breath is worse when supine better when sitting up, she denies cough, fever, wheezing, chest pain.  She has recently gone vascular atherectomy of her right lower leg secondary to a nonhealing ankle wound, under the care of of Dr. Allena Katz who placed her in a Unna boot of this right lower leg yesterday.  Prior to the [employer] she had increased swelling of the leg but denies pain.  She also states she has recently had a decrease in her amiodarone to help minimize tremor in her extremities.  She believes that cutting back on this medicine has caused her blood pressure to be not well-controlled.  The history is provided by the patient.       Home Medications Prior to Admission medications   Medication Sig Start Date End Date Taking? Authorizing Provider  AMBULATORY NON FORMULARY MEDICATION Dispense one Readi Steadi glove 09/25/22   Tat, Octaviano Batty, DO  amiodarone (PACERONE) 200 MG tablet Take 0.5 tablets (100 mg total) by mouth every other day. 10/22/22   Wendall Stade, MD  amoxicillin-clavulanate (AUGMENTIN) 875-125 MG tablet Take 1 tablet by mouth 2 (two) times daily. 11/14/22   Candelaria Stagers, DPM  apixaban (ELIQUIS) 2.5 MG TABS tablet Take 1 tablet (2.5 mg total) by mouth 2 (two) times daily. 07/23/22   Wendall Stade, MD  ascorbic acid (VITAMIN C) 500 MG tablet Take 500 mg by mouth in the morning.    [provider]  CALCIUM PO Take 1,200 mg by mouth 2 (two) times daily.     [provider]  Camphor-Menthol-Methyl Sal (SALONPAS) 3.04-14-08 % PTCH Place 1 patch onto the skin daily as needed (pain.).    [provider]  Cholecalciferol (VITAMIN D-3) 25 MCG (1000 UT) CAPS Take 1,000-2,000 tablets by mouth See admin instructions. Take 1000 units tablet by mouth in the morning & take 29518 units tablets by mouth at night.    [provider]  clopidogrel (PLAVIX) 75 MG tablet TAKE 1 TABLET EVERY MORNING 10/30/22   Wendall Stade, MD  denosumab (PROLIA) 60 MG/ML SOSY injection Inject 60 mg into the skin every 6 (six) months.    Benita Stabile, MD  doxycycline (VIBRA-TABS) 100 MG tablet Take 100 mg by mouth 2 (two) times daily. 11/01/22   [provider]  Ferrous Sulfate (IRON PO) Take 65 mg by mouth at bedtime.    [provider]  furosemide (LASIX) 20 MG tablet Take 1 tablet (20 mg total) by mouth 2 (two) times daily. Patient taking differently: Take 20 mg by mouth in the morning. 07/10/22   Hughie Closs, MD  MAGNESIUM OXIDE PO Take 500 mg by mouth every morning.    [provider]  metoprolol tartrate (LOPRESSOR) 25 MG tablet TAKE 3 TABLETS TWICE A DAY 10/06/21   Wendall Stade, MD  Multiple Vitamin (MULTIVITAMIN WITH MINERALS) TABS tablet Take 1 tablet by mouth every morning.    [provider]  pantoprazole (PROTONIX) 40 MG tablet Take 1 tablet (40 mg total) by mouth daily. 10/12/22   Wendall Stade, MD  potassium chloride (KLOR-CON M) 10 MEQ tablet Take 1 tablet (10 mEq total) by mouth daily. Patient taking differently: Take 10 mEq by mouth in the morning. 05/26/22   Johnson, Clanford L, MD  pravastatin (PRAVACHOL) 40 MG tablet TAKE 1 TABLET DAILY 08/27/22   Wendall Stade, MD  SANTYL 250 UNIT/GM ointment APPLY TO AFFECTED AREAS ONCE DAILY. Patient taking differently: Apply 1 Application topically daily. 03/30/22   Candelaria Stagers, DPM  umeclidinium bromide (INCRUSE ELLIPTA) 62.5 MCG/ACT AEPB Inhale 1 puff into the  lungs daily. 06/25/22   Kalman Shan, MD      Allergies    Iodine, Iohexol, Sulfa antibiotics, Petroleum gauze non-woven 3x9" [wound dressings], Povidone-iodine, Wound dressing adhesive, and Tape    Review of Systems   Review of Systems  Constitutional:  Positive for fatigue. Negative for chills and fever.  HENT:  Negative for congestion.   Eyes: Negative.   Respiratory:  Positive for shortness of breath. Negative for cough.   Cardiovascular:  Negative for chest pain and palpitations.  Gastrointestinal: Negative.   Genitourinary: Negative.   Musculoskeletal:  Negative for neck pain.  Skin: Negative.  Negative for wound.  Neurological:  Negative for dizziness, weakness, light-headedness, numbness and headaches.  Psychiatric/Behavioral: Negative.      Physical Exam Updated Vital Signs BP (!) 189/83   Pulse 70   Temp 98 F (36.7 C) (Oral)   Resp 18   Ht 5\' 2"  (1.575 m)   Wt 44.1 kg   SpO2 98%   BMI 17.78 kg/m  Physical Exam Vitals and nursing note reviewed.  Constitutional:      Appearance: She is well-developed.  HENT:     Head: Normocephalic and atraumatic.  Eyes:     Conjunctiva/sclera: Conjunctivae normal.  Cardiovascular:     Rate and Rhythm: Normal rate and regular rhythm.     Heart sounds: Normal heart sounds.  Pulmonary:     Effort: Pulmonary effort is normal.     Breath sounds: Examination of the right-middle field reveals rales. Examination of the right-lower field reveals rales. Examination of the left-lower field reveals rales. Decreased breath sounds and rales present. No wheezing.  Abdominal:     General: Bowel sounds are normal.     Palpations: Abdomen is soft.     Tenderness: There is no abdominal tenderness.  Musculoskeletal:        General: Normal range of motion.     Cervical back: Normal range of motion.     Right lower leg: No edema.     Left lower leg: No edema.     Comments: Unna boot in place right lower leg  Skin:    General: Skin is  warm and dry.  Neurological:     Mental Status: She is alert.     ED Results / Procedures / Treatments   Labs (all labs ordered are listed, but only abnormal results are displayed) Labs Reviewed  BASIC METABOLIC PANEL - Abnormal; Notable for the following components:      Result Value   BUN 32 (*)    Creatinine, Ser 1.32 (*)    GFR, Estimated 40 (*)    All other components within normal limits  BRAIN NATRIURETIC PEPTIDE - Abnormal; Notable for the following components:   B Natriuretic Peptide 2,137.0 (*)    All other components within normal limits  CBC -  Abnormal; Notable for the following components:   RBC 3.11 (*)    Hemoglobin 10.4 (*)    HCT 31.6 (*)    MCV 101.6 (*)    RDW 15.7 (*)    All other components within normal limits  D-DIMER, QUANTITATIVE - Abnormal; Notable for the following components:   D-Dimer, Quant 0.98 (*)    All other components within normal limits  MAGNESIUM  BASIC METABOLIC PANEL  CBC    EKG EKG Interpretation Date/Time:  Saturday November 17 2022 16:04:12 EDT Ventricular Rate:  69 PR Interval:    QRS Duration:  88 QT Interval:  492 QTC Calculation: 528 R Axis:   66  Text Interpretation: Atrial fibrillation Left ventricular hypertrophy Nonspecific T abnormalities, diffuse leads Prolonged QT interval since last tracing no significant change Confirmed by Eber Hong ([id number]) on 11/17/2022 4:06:08 PM  Radiology DG Chest Portable 1 View  Result Date: 11/17/2022 CLINICAL DATA:  Shortness of breath. EXAM: PORTABLE CHEST 1 VIEW COMPARISON:  Two-view chest x-ray 07/09/2022 FINDINGS: THE: FINDINGS: THE The heart is mildly enlarged. Changes of COPD are again noted. Scarring present. Small bilateral pleural effusions present. Pulmonary vascular congestion is present without frank edema. IMPRESSION: 1. Cardiomegaly and pulmonary vascular congestion without frank edema. 2. Small bilateral pleural effusions. 3. COPD. Electronically Signed   By: Marin Roberts M.D.   On: 11/17/2022 17:42    Procedures Procedures    Medications Ordered in ED Medications  amiodarone (PACERONE) tablet 100 mg (has no administration in time range)  metoprolol tartrate (LOPRESSOR) tablet 75 mg (has no administration in time range)  pravastatin (PRAVACHOL) tablet 40 mg (has no administration in time range)  pantoprazole (PROTONIX) EC tablet 40 mg (has no administration in time range)  apixaban (ELIQUIS) tablet 2.5 mg (has no administration in time range)  clopidogrel (PLAVIX) tablet 75 mg (has no administration in time range)  umeclidinium bromide (INCRUSE ELLIPTA) 62.5 MCG/ACT 1 puff (has no administration in time range)  furosemide (LASIX) injection 40 mg (has no administration in time range)  sodium chloride flush (NS) 0.9 % injection 3 mL (has no administration in time range)  acetaminophen (TYLENOL) tablet 650 mg (has no administration in time range)    Or  acetaminophen (TYLENOL) suppository 650 mg (has no administration in time range)  furosemide (LASIX) injection 40 mg (40 mg Intravenous Given 11/17/22 1734)  potassium chloride SA (KLOR-CON M) CR tablet 20 mEq (20 mEq Oral Given 11/17/22 1732)    ED Course/ Medical Decision Making/ A&P                                 Medical Decision Making Patient presenting with increased shortness of breath today with orthopnea, generalized fatigue and worsening shortness of breath with exertion.  No chest pain.  Had a recent vascular procedure on her right lower leg secondary to claudication, nonhealing wound at her right lateral ankle presenting an Radio broadcast assistant.  She has a history of CHF, she denies pleuritic symptoms, no chest pain.  With orthopnea, differential diagnosis with acute on chronic CHF as probable diagnosis, although she has had some recent edema in her right leg prior to the Unna boot, probably related to her wound in her recent procedure, but could also represent PE.  Felt less likely as she is on  Eliquis for treatment of persistent A-fib.  She was given Lasix per IV 40 mg after which she diuresed,  urinated almost 1 L but still hypoxic to the low 80s when supine, however when she sits upright her oxygen saturation is 90 to 91% on room air.  Amount and/or Complexity of Data Reviewed Labs: ordered.    Details: Significant labs including a BNP of 2137, her creatinine is 1.32, she has a D-dimer of 0.98, hemoglobin 10.4.  Please labs are relatively stable, although hemoglobin is a little bit reduced today it was 11.9 34-month ago.  Her creatinine is stable. Radiology: ordered.    Details: Chest x-ray reviewed and agree with interpretation, pulmonary vascular congestion with small bilateral pleural effusions. Discussion of management or test interpretation with external provider(s): Reviewed, rate 69, A-fib, LVH  Risk Prescription drug management. Decision regarding hospitalization. Risk Details: Discussed with Dr. Antionette Char who accepts patient for admission.           Final Clinical Impression(s) / ED Diagnoses Final diagnoses:  Acute on chronic congestive heart failure, unspecified heart failure type [facility])    Rx / DC Orders ED Discharge Orders     None         Victoriano Lain 11/17/22 2239    Eber Hong, MD 11/18/22 1512

## 2022-11-18 ENCOUNTER — Inpatient Hospital Stay (HOSPITAL_COMMUNITY): Payer: Medicare HMO

## 2022-11-18 DIAGNOSIS — I5043 Acute on chronic combined systolic (congestive) and diastolic (congestive) heart failure: Secondary | ICD-10-CM

## 2022-11-18 LAB — BASIC METABOLIC PANEL WITH GFR
Anion gap: 8 (ref 5–15)
BUN: 31 mg/dL — ABNORMAL HIGH (ref 8–23)
CO2: 25 mmol/L (ref 22–32)
Calcium: 8.8 mg/dL — ABNORMAL LOW (ref 8.9–10.3)
Chloride: 105 mmol/L (ref 98–111)
Creatinine, Ser: 1.35 mg/dL — ABNORMAL HIGH (ref 0.44–1.00)
GFR, Estimated: 39 mL/min — ABNORMAL LOW (ref >60)
Glucose, Bld: 117 mg/dL — ABNORMAL HIGH (ref 70–99)
Potassium: 4.1 mmol/L (ref 3.5–5.1)
Sodium: 138 mmol/L (ref 135–145)

## 2022-11-18 LAB — CBC
HCT: 31.3 % — ABNORMAL LOW (ref 36.0–46.0)
Hemoglobin: 9.8 g/dL — ABNORMAL LOW (ref 12.0–15.0)
MCH: 32.7 pg (ref 26.0–34.0)
MCHC: 31.3 g/dL (ref 30.0–36.0)
MCV: 104.3 fL — ABNORMAL HIGH (ref 80.0–100.0)
Platelets: 254 10*3/uL (ref 150–400)
RBC: 3 MIL/uL — ABNORMAL LOW (ref 3.87–5.11)
RDW: 15.6 % — ABNORMAL HIGH (ref 11.5–15.5)
WBC: 10.3 10*3/uL (ref 4.0–10.5)
nRBC: 0 % (ref 0.0–0.2)

## 2022-11-18 NOTE — Care Management CC44 (Signed)
Condition Code 44 Documentation Completed  Patient Details  Name: [name] MRN: [id number] Date of Birth: 06-27-1940   Condition Code 44 given:  Yes Patient signature on Condition Code 44 notice:  Yes Documentation of 2 MD's agreement:  Yes Code 44 added to claim:  Yes    Catalina Gravel, LCSW 11/18/2022, 12:19 PM

## 2022-11-18 NOTE — Discharge Summary (Signed)
Physician Discharge Summary  [name] ZOX:[id number] DOB: 12/10/1940 DOA: 11/17/2022  PCP: Benita Stabile, MD  Admit date: 11/17/2022 Discharge date: 11/18/2022  Admitted From: Home Disposition: Home  Recommendations for Outpatient Follow-up:  Follow up with PCP in 1-2 weeks Please obtain BMP/CBC in one week Please follow up on the following pending results:  Home Health: N/A Equipment/Devices: N/A  Discharge Condition: Stable CODE STATUS: DNR Diet recommendation: Low-salt diet, fluid restriction 1500 mL in 24 hours.    Discharge summary: 82 year old with history of COPD, CKD stage IIIb, A-fib on Eliquis, peripheral arterial disease, left lower extremity chronic wound and ankle ulcer presented with 1 day of shortness of breath with history of chronic shortness of breath.  She does have history of chronic systolic heart failure with known ejection fraction of 40 to 45%.  In the emergency room, afebrile.  88% on room air and mildly tachypneic and elevated blood pressure.  Chest x-ray notable for cardiomegaly with pulmonary vascular congestion and small bilateral pleural effusions.  Creatinine 1.32 at about baseline.  D-dimer 0.98.  She was diagnosed with acute on chronic congestive heart failure and admitted with IV diuresis.  Acute on chronic combined systolic and diastolic congestive heart failure: Symptomatically better with IV diuresis overnight.  Currently euvolemic on clinical exam.  Mobilizing around on room air and symptoms have improved. Plan On Lasix 20 mg daily at home, she will take 20 mg twice daily for next 3 days then go back on 20 mg daily.  Discussed about salt and liquid intake restrictions. Patient on beta-blockers that she will continue.  Not on ACE or ARB due to renal dysfunction.  Chronic medical issues including Chronic leg wounds, peripheral vascular disease.  Is stable.  D-dimer mildly elevated.  Already therapeutic on Eliquis. COPD, stable.  Not in  exacerbation. CKD stage IIIb, renal functions at about baseline. Peripheral arterial disease, on Plavix and statin.  Followed by vascular surgery.  Stable for discharge.  Patient was admitted with significant findings and anticipated more than 2 midnights of hospital stay, however with aggressive initial treatment and IV diuretics her symptoms are improved so she was able to go home before 2 midnights.    Discharge Diagnoses:  Principal Problem:   CHF, acute on chronic (HCC) Active Problems:   Persistent atrial fibrillation (HCC)   COPD, moderate (HCC)   Wound of ankle   PAD (peripheral artery disease) (HCC)   CKD stage 3b, GFR 30-44 ml/min (HCC)   D-dimer, elevated    Discharge Instructions  Discharge Instructions     Diet - low sodium heart healthy   Complete by: As directed    Discharge instructions   Complete by: As directed    Take Lasix tables 20 mg two times daily for next 3 days and go back to usual daily doses   Increase activity slowly   Complete by: As directed       Allergies as of 11/18/2022       Reactions   Iodine Rash, Other (See Comments)   BETADINE Rash/burning, blisters on skin. Burns skin   Iohexol Rash, Other (See Comments)   Blisters; PT NEEDS 13-HOUR PREP   Sulfa Antibiotics Nausea And Vomiting   Petroleum Gauze Non-woven 3x9" [wound Dressings] Other (See Comments)   BURNING SENSATION, RED SKIN   Povidone-iodine Other (See Comments)   Burns skin   Wound Dressing Adhesive Other (See Comments)   Burns skin   Tape Itching, Rash, Other (See Comments)   "  DO NOT USE ADHESIVE TAPE" Not even a Band Aid" NO PAPER TAPE Burns skin Skin is very very thin        Medication List     STOP taking these medications    amoxicillin-clavulanate 875-125 MG tablet Commonly known as: AUGMENTIN   doxycycline 100 MG tablet Commonly known as: VIBRA-TABS       TAKE these medications    AMBULATORY NON FORMULARY MEDICATION Dispense one Readi  Steadi glove   amiodarone 200 MG tablet Commonly known as: PACERONE Take 0.5 tablets (100 mg total) by mouth every other day.   apixaban 2.5 MG Tabs tablet Commonly known as: Eliquis Take 1 tablet (2.5 mg total) by mouth 2 (two) times daily.   ascorbic acid 500 MG tablet Commonly known as: VITAMIN C Take 500 mg by mouth in the morning.   CALCIUM PO Take 1,200 mg by mouth 2 (two) times daily.   clopidogrel 75 MG tablet Commonly known as: PLAVIX TAKE 1 TABLET EVERY MORNING   denosumab 60 MG/ML Sosy injection Commonly known as: PROLIA Inject 60 mg into the skin every 6 (six) months.   furosemide 20 MG tablet Commonly known as: LASIX Take 1 tablet (20 mg total) by mouth 2 (two) times daily. What changed: when to take this   Incruse Ellipta 62.5 MCG/ACT Aepb Generic drug: umeclidinium bromide Inhale 1 puff into the lungs daily.   IRON PO Take 65 mg by mouth at bedtime.   MAGNESIUM OXIDE PO Take 500 mg by mouth every morning.   metoprolol tartrate 25 MG tablet Commonly known as: LOPRESSOR TAKE 3 TABLETS TWICE A DAY   multivitamin with minerals Tabs tablet Take 1 tablet by mouth every morning.   pantoprazole 40 MG tablet Commonly known as: PROTONIX Take 1 tablet (40 mg total) by mouth daily.   potassium chloride 10 MEQ tablet Commonly known as: KLOR-CON M Take 1 tablet (10 mEq total) by mouth daily. What changed: when to take this   pravastatin 40 MG tablet Commonly known as: PRAVACHOL TAKE 1 TABLET DAILY   Salonpas 3.04-14-08 % Ptch Generic drug: Camphor-Menthol-Methyl Sal Place 1 patch onto the skin daily as needed (pain.).   Santyl 250 UNIT/GM ointment Generic drug: collagenase APPLY TO AFFECTED AREAS ONCE DAILY. What changed: See the new instructions.   Vitamin D-3 25 MCG (1000 UT) Caps Take 1,000-2,000 tablets by mouth See admin instructions. Take 1000 units tablet by mouth in the morning & take 95188 units tablets by mouth at night.         Allergies  Allergen Reactions   Iodine Rash and Other (See Comments)    BETADINE Rash/burning, blisters on skin. Burns skin   Iohexol Rash and Other (See Comments)    Blisters; PT NEEDS 13-HOUR PREP    Sulfa Antibiotics Nausea And Vomiting   Petroleum Gauze Non-Woven 3x9" [Wound Dressings] Other (See Comments)    BURNING SENSATION, RED SKIN   Povidone-Iodine Other (See Comments)    Burns skin   Wound Dressing Adhesive Other (See Comments)    Burns skin   Tape Itching, Rash and Other (See Comments)    "DO NOT USE ADHESIVE TAPE" Not even a Band Aid" NO PAPER TAPE Burns skin Skin is very very thin    Consultations: None   Procedures/Studies: DG Chest Portable 1 View  Result Date: 11/17/2022 CLINICAL DATA:  Shortness of breath. EXAM: PORTABLE CHEST 1 VIEW COMPARISON:  Two-view chest x-ray 07/09/2022 FINDINGS: THE: FINDINGS: THE The heart is mildly enlarged.  Changes of COPD are again noted. Scarring present. Small bilateral pleural effusions present. Pulmonary vascular congestion is present without frank edema. IMPRESSION: 1. Cardiomegaly and pulmonary vascular congestion without frank edema. 2. Small bilateral pleural effusions. 3. COPD. Electronically Signed   By: Marin Roberts M.D.   On: 11/17/2022 17:42   (Echo, Carotid, EGD, Colonoscopy, ERCP)    Subjective: Seen and examined in the morning rounds.  Patient denied any complaints.  She is feels better after overnight stay in the hospital.  Denies any chest pain or shortness of breath.  She walked around in the hallway and oxygen saturation were 92%.   Discharge Exam: Vitals:   11/18/22 1034 11/18/22 1056  BP:    Pulse:    Resp:    Temp:    SpO2: 91% 92%   Vitals:   11/18/22 0809 11/18/22 0840 11/18/22 1034 11/18/22 1056  BP: (!) 150/80     Pulse: 69     Resp:      Temp:      TempSrc:      SpO2:  (!) 89% 91% 92%  Weight:      Height:        General: Pt is alert, awake, not in acute distress Thin  and frail but comfortable. Cardiovascular: RRR, S1/S2 +, no rubs, no gallops Respiratory: CTA bilaterally, no wheezing, no rhonchi Abdominal: Soft, NT, ND, bowel sounds + Extremities: Left leg on Unna boot, some dorsal edema present.  Mostly chronic changes.    The results of significant diagnostics from this hospitalization (including imaging, microbiology, ancillary and laboratory) are listed below for reference.     Microbiology: No results found for this or any previous visit (from the past 240 hour(s)).   Labs: BNP (last 3 results) Recent Labs    06/22/22 0742 07/09/22 1432 11/17/22 1809  BNP 2,405.0* 1,992.0* 2,137.0*   Basic Metabolic Panel: Recent Labs  Lab 11/17/22 1809 11/18/22 0341  NA 141 138  K 3.9 4.1  CL 106 105  CO2 25 25  GLUCOSE 89 117*  BUN 32* 31*  CREATININE 1.32* 1.35*  CALCIUM 9.2 8.8*  MG 2.1  --    Liver Function Tests: No results for input(s): "AST", "ALT", "ALKPHOS", "BILITOT", "PROT", "ALBUMIN" in the last 168 hours. No results for input(s): "LIPASE", "AMYLASE" in the last 168 hours. No results for input(s): "AMMONIA" in the last 168 hours. CBC: Recent Labs  Lab 11/17/22 1809 11/18/22 0341  WBC 10.3 10.3  HGB 10.4* 9.8*  HCT 31.6* 31.3*  MCV 101.6* 104.3*  PLT 250 254   Cardiac Enzymes: No results for input(s): "CKTOTAL", "CKMB", "CKMBINDEX", "TROPONINI" in the last 168 hours. BNP: Invalid input(s): "POCBNP" CBG: No results for input(s): "GLUCAP" in the last 168 hours. D-Dimer Recent Labs    11/17/22 1809  DDIMER 0.98*   Hgb A1c No results for input(s): "HGBA1C" in the last 72 hours. Lipid Profile No results for input(s): "CHOL", "HDL", "LDLCALC", "TRIG", "CHOLHDL", "LDLDIRECT" in the last 72 hours. Thyroid function studies No results for input(s): "TSH", "T4TOTAL", "T3FREE", "THYROIDAB" in the last 72 hours.  Invalid input(s): "FREET3" Anemia work up No results for input(s): "VITAMINB12", "FOLATE", "FERRITIN",  "TIBC", "IRON", "RETICCTPCT" in the last 72 hours. Urinalysis    Component Value Date/Time   COLORURINE AMBER (A) 03/06/2022 0520   APPEARANCEUR CLEAR 03/06/2022 0520   LABSPEC 1.025 03/06/2022 0520   PHURINE 6.0 03/06/2022 0520   GLUCOSEU NEGATIVE 03/06/2022 0520   HGBUR NEGATIVE 03/06/2022 0520   BILIRUBINUR  NEGATIVE 03/06/2022 0520   KETONESUR 5 (A) 03/06/2022 0520   PROTEINUR 100 (A) 03/06/2022 0520   UROBILINOGEN 0.2 02/15/2015 0417   NITRITE NEGATIVE 03/06/2022 0520   LEUKOCYTESUR NEGATIVE 03/06/2022 0520   Sepsis Labs Recent Labs  Lab 11/17/22 1809 11/18/22 0341  WBC 10.3 10.3   Microbiology No results found for this or any previous visit (from the past 240 hour(s)).   Time coordinating discharge: 32 minutes  SIGNED:   Dorcas Carrow, MD  [facility] 11/18/2022, 11:14 AM

## 2022-11-18 NOTE — Care Management CC44 (Signed)
Condition Code 44 Documentation Completed  Patient Details  Name: [name] MRN: [id number] Date of Birth: Mar 03, 1941   Condition Code 44 given:  Yes Patient signature on Condition Code 44 notice:  Yes Documentation of 2 MD's agreement:  Yes Code 44 added to claim:  Yes    Catalina Gravel, LCSW 11/18/2022, 12:23 PM

## 2022-11-18 NOTE — TOC Progression Note (Signed)
Transition of Care [facility]) - Progression Note    Patient Details  Name: [name] MRN: [id number] Date of Birth: 02-Jun-1940  Transition of [facility]) CM/SW Contact  Catalina Gravel, [place] Phone Number: 11/18/2022, 12:44 PM  Clinical Narrative:     CSW met with pt and spouse at bedside. Reviewed C44 and MOON.  Spouse upset and said more than observed happened , test were taken etc.  He was later apologetic to CSW but not agreeing to process. He did sign C44 document understanding the change.  No further TOC needs.     Barriers to Discharge: No Barriers Identified  Expected Discharge Plan and Services         Expected Discharge Date: 11/18/22                                     Social Determinants of Health (SDOH) Interventions SDOH Screenings   Food Insecurity: No Food Insecurity (11/17/2022)  Housing: Low Risk  (11/17/2022)  Transportation Needs: No Transportation Needs (11/17/2022)  Utilities: Not At Risk (11/17/2022)  Social Connections: Unknown (08/29/2022)   Received from [facility]  Tobacco Use: Medium Risk (11/17/2022)    Readmission Risk Interventions    06/18/2022    1:55 PM 03/07/2022    8:09 AM  Readmission Risk Prevention Plan  Transportation Screening Complete Complete  HRI or Home Care Consult Complete Complete  Social Work Consult for Recovery Care Planning/Counseling Complete Complete  Palliative Care Screening Not Applicable Not Applicable  Medication Review Ocean[employer]) Complete Complete

## 2022-11-18 NOTE — Care Management Obs Status (Signed)
MEDICARE OBSERVATION STATUS NOTIFICATION   Patient Details  Name: [name] MRN: [id number] Date of Birth: 1940/08/07   Medicare Observation Status Notification Given:  Yes     Marsh Dolly, LCSW 11/18/2022, 12:19 PM

## 2022-11-18 NOTE — Progress Notes (Addendum)
Patient BP 183/106, MAP 111. Scheduled Metoprolol given and Dr. Antionette Char notified through secure chat. New PRN orders given.   BP recheck after scheduled Metoprolol 167/72.

## 2022-11-19 ENCOUNTER — Ambulatory Visit (INDEPENDENT_AMBULATORY_CARE_PROVIDER_SITE_OTHER)
Admission: RE | Admit: 2022-11-19 | Discharge: 2022-11-19 | Disposition: A | Discharge status: Home / Self Care | Payer: Medicare HMO | Source: Ambulatory Visit | Attending: Surgery | Admitting: Surgery

## 2022-11-19 ENCOUNTER — Ambulatory Visit (HOSPITAL_COMMUNITY)
Admission: RE | Admit: 2022-11-19 | Discharge: 2022-11-19 | Disposition: A | Discharge status: Home / Self Care | Payer: Medicare HMO | Source: Ambulatory Visit | Attending: Surgery | Admitting: Surgery

## 2022-11-19 ENCOUNTER — Ambulatory Visit (INDEPENDENT_AMBULATORY_CARE_PROVIDER_SITE_OTHER): Payer: Medicare HMO | Admitting: Surgery

## 2022-11-19 ENCOUNTER — Encounter: Payer: Self-pay | Admitting: Surgery

## 2022-11-19 VITALS — BP 164/73 | HR 66 | Temp 98.2°F | Wt 90.3 lb

## 2022-11-19 DIAGNOSIS — I7025 Atherosclerosis of native arteries of other extremities with ulceration: Secondary | ICD-10-CM | POA: Diagnosis not present

## 2022-11-19 DIAGNOSIS — I70233 Atherosclerosis of native arteries of right leg with ulceration of ankle: Secondary | ICD-10-CM

## 2022-11-19 LAB — VAS US ABI WITH/WO TBI
Left ABI: 1.08
Right ABI: 1.16

## 2022-11-19 NOTE — Progress Notes (Signed)
[facility] [place]  Patient name: [name] MRN: [id number] DOB: January 05, 1941 Sex: female   REASON FOR VISIT:    Follow up  HISOTRY OF PRESENT ILLNESS:    [name] is a 82 y.o. female who has undergone the following procedures:  12/19/2021: Left superficial femoral artery stent, DCB, left popliteal artery, angioplasty, left peroneal artery (critical limb ischemia) 08/28/2022: Laser atherectomy with angioplasty, left popliteal and left anterior tibial artery (left leg ulcer) 10/16/2022: Laser atherectomy with angioplasty, right anterior tibial artery (ulcer)  She has undergone left great toe amputation by Dr. Allena Katz on 12/29/2021.  Her left leg wound has been slowly getting better.  She developed a right leg wound in June 2024.  This is now being treated with Santyl and compression which is being followed by podiatry PAST MEDICAL HISTORY:   Past Medical History:  Diagnosis Date   Anemia 05/2014.   Atrial fibrillation (HCC)    x 3 yrs   Breast cancer (HCC)    S/P left mastectomy and chemotherapy 1989 remained in remission   Carotid artery occlusion    Carotid bruit    LICA 60-79% (duplex 9/11)   Degenerative arthritis of spine 2015   Chronic back pain   Hyperlipidemia    Hypertension    Meningioma (HCC)    Osteoporosis    Pneumonia May 19, 2014   Stroke [facility])    CVA 2008   Subclavian steal syndrome    Ulcers of both lower extremities (HCC) 2015     FAMILY HISTORY:   Family History  Problem Relation Age of Onset   Alzheimer's disease Mother    Dementia Mother    Hypertension Mother    Hyperlipidemia Mother    Parkinsonism Father     SOCIAL HISTORY:   Social History   Tobacco Use   Smoking status: Former    Current packs/day: 0.00    Average packs/day: 1 pack/day for 56.0 years (56.0 ttl pk-yrs)    Types: Cigarettes    Start date: 08/14/1950    Quit date: 08/14/2006    Years since quitting: 16.2     Passive exposure: Never   Smokeless tobacco: Never   Tobacco comments:    quit in 2008  Substance Use Topics   Alcohol use: No    Alcohol/week: 0.0 standard drinks of alcohol     ALLERGIES:   Allergies  Allergen Reactions   Iodine Rash and Other (See Comments)    BETADINE Rash/burning, blisters on skin. Burns skin   Iohexol Rash and Other (See Comments)    Blisters; PT NEEDS 13-HOUR PREP    Sulfa Antibiotics Nausea And Vomiting   Petroleum Gauze Non-Woven 3x9" [Wound Dressings] Other (See Comments)    BURNING SENSATION, RED SKIN   Povidone-Iodine Other (See Comments)    Burns skin   Wound Dressing Adhesive Other (See Comments)    Burns skin   Tape Itching, Rash and Other (See Comments)    "DO NOT USE ADHESIVE TAPE" Not even a Band Aid" NO PAPER TAPE Burns skin Skin is very very thin     CURRENT MEDICATIONS:   Current Outpatient Medications  Medication Sig Dispense Refill   AMBULATORY NON FORMULARY MEDICATION Dispense one Readi Steadi glove 1 Units 0   amiodarone (PACERONE) 200 MG tablet Take 0.5 tablets (100 mg total) by mouth every other day.     apixaban (ELIQUIS) 2.5 MG TABS tablet Take 1 tablet (2.5 mg total) by mouth 2 (two)  times daily. 180 tablet 1   ascorbic acid (VITAMIN C) 500 MG tablet Take 500 mg by mouth in the morning.     CALCIUM PO Take 1,200 mg by mouth 2 (two) times daily.     Camphor-Menthol-Methyl Sal (SALONPAS) 3.04-14-08 % PTCH Place 1 patch onto the skin daily as needed (pain.).     Cholecalciferol (VITAMIN D-3) 25 MCG (1000 UT) CAPS Take 1,000-2,000 tablets by mouth See admin instructions. Take 1000 units tablet by mouth in the morning & take 96295 units tablets by mouth at night.     clopidogrel (PLAVIX) 75 MG tablet TAKE 1 TABLET EVERY MORNING 90 tablet 2   denosumab (PROLIA) 60 MG/ML SOSY injection Inject 60 mg into the skin every 6 (six) months.     Ferrous Sulfate (IRON PO) Take 65 mg by mouth at bedtime.     furosemide (LASIX) 20 MG  tablet Take 1 tablet (20 mg total) by mouth 2 (two) times daily. (Patient taking differently: Take 20 mg by mouth in the morning.) 30 tablet 0   MAGNESIUM OXIDE PO Take 500 mg by mouth every morning.     metoprolol tartrate (LOPRESSOR) 25 MG tablet TAKE 3 TABLETS TWICE A DAY 540 tablet 3   Multiple Vitamin (MULTIVITAMIN WITH MINERALS) TABS tablet Take 1 tablet by mouth every morning.     pantoprazole (PROTONIX) 40 MG tablet Take 1 tablet (40 mg total) by mouth daily. 90 tablet 2   potassium chloride (KLOR-CON M) 10 MEQ tablet Take 1 tablet (10 mEq total) by mouth daily. (Patient taking differently: Take 10 mEq by mouth in the morning.) 30 tablet 2   pravastatin (PRAVACHOL) 40 MG tablet TAKE 1 TABLET DAILY 90 tablet 3   SANTYL 250 UNIT/GM ointment APPLY TO AFFECTED AREAS ONCE DAILY. (Patient taking differently: Apply 1 Application topically daily.) 30 g 0   umeclidinium bromide (INCRUSE ELLIPTA) 62.5 MCG/ACT AEPB Inhale 1 puff into the lungs daily. 90 each 3   No current facility-administered medications for this visit.    REVIEW OF SYSTEMS:   [X]  denotes positive finding, [ ]  denotes negative finding Cardiac  Comments:  Chest pain or chest pressure:    Shortness of breath upon exertion:    Short of breath when lying flat:    Irregular heart rhythm:        Vascular    Pain in calf, thigh, or hip brought on by ambulation:    Pain in feet at night that wakes you up from your sleep:     Blood clot in your veins:    Leg swelling:         Pulmonary    Oxygen at home:    Productive cough:     Wheezing:         Neurologic    Sudden weakness in arms or legs:     Sudden numbness in arms or legs:     Sudden onset of difficulty speaking or slurred speech:    Temporary loss of vision in one eye:     Problems with dizziness:         Gastrointestinal    Blood in stool:     Vomited blood:         Genitourinary    Burning when urinating:     Blood in urine:        Psychiatric    Major  depression:         Hematologic    Bleeding problems:  Problems with blood clotting too easily:        Skin    Rashes or ulcers:        Constitutional    Fever or chills:      PHYSICAL EXAM:   Vitals:   11/19/22 1126  BP: (!) 164/73  Pulse: 66  Temp: 98.2 F (36.8 C)  TempSrc: Temporal  SpO2: 90%  Weight: 90 lb 4.8 oz (41 kg)    GENERAL: The patient is a well-nourished female, in no acute distress. The vital signs are documented above. CARDIAC: There is a regular rate and rhythm.  PULMONARY: Non-labored respirations MUSCULOSKELETAL: There are no major deformities or cyanosis. NEUROLOGIC: No focal weakness or paresthesias are detected. SKIN: There are no ulcers or rashes noted. PSYCHIATRIC: The patient has a normal affect.  STUDIES:   I have reviewed the following: +-------+-----------+-----------+------------+------------+  ABI/TBIToday's ABIToday's TBIPrevious ABIPrevious TBI  +-------+-----------+-----------+------------+------------+  Right 1.16       0.75       0.65        0.41          +-------+-----------+-----------+------------+------------+  Left  1.08       amputation 1.11        amputation    +-------+-----------+-----------+------------+------------+  Right toe pressure: 129, triphasic waveforms  Right: 50-74% stenosis noted in the superficial femoral artery. 50-74%  stenosis noted in the superficial femoral artery and/or popliteal artery.  Probable tibial artery occlusive disease.   MEDICAL ISSUES:   Right leg wound: She has a Radio broadcast assistant in place today that was not removed.  Vascular lab studies suggest adequate blood flow for healing.  Her toe pressures are 129.  She will continue to get wound care.  I will see her back in 3 months for repeat ultrasound.  She does have mild SFA and popliteal disease that could be stented however this would be a long segment to treat and I doubt would have great long-term patency, and so I would like  to avoid intervening and less absolutely necessary    [name], IV, MD, FACS [facility] [phone] Pager [phone]

## 2022-11-20 ENCOUNTER — Other Ambulatory Visit: Payer: Self-pay

## 2022-11-20 ENCOUNTER — Telehealth: Payer: Self-pay | Admitting: Cardiovascular Disease

## 2022-11-20 MED ORDER — METOPROLOL TARTRATE 25 MG PO TABS: 75.0000 mg | ORAL_TABLET | Freq: Two times a day (BID) | ORAL | 2 refills | Status: DC

## 2022-11-20 NOTE — Telephone Encounter (Signed)
Called patient's husband back about message. He was wondering if patient had to take lasix BID, that she cannot handle taking lasix twice a day. Patient was just in ED due to SOB. Informed patient's husband if patient gets SOB she might benefit from taking the lasix BID. Patient's husband was wondering if patient should go back to amiodarone 200 mg daily, instead of 100 mg. Informed patient's husband that it would be best to have patient come in, and see a PA/NP to evaluate and make medication adjustments if needed. Patient's husband was agreeable to plan.

## 2022-11-20 NOTE — Telephone Encounter (Signed)
Patient's husband called stating patient was discharge from the hospital on Sunday and his questions about her medications and would like to speak to nurse about it.

## 2022-11-22 ENCOUNTER — Other Ambulatory Visit: Payer: Self-pay

## 2022-11-22 DIAGNOSIS — I739 Peripheral vascular disease, unspecified: Secondary | ICD-10-CM

## 2022-11-23 ENCOUNTER — Telehealth: Payer: Self-pay | Admitting: Internal Medicine

## 2022-11-23 NOTE — Telephone Encounter (Signed)
Last  AVS says the following:  Continue INCRUSE daily with albuterol as needed   We never called in the Albuterol, she said.  Pharm is [facility] in [place]

## 2022-11-26 ENCOUNTER — Encounter: Payer: Self-pay | Admitting: Nurse Practitioner

## 2022-11-26 ENCOUNTER — Other Ambulatory Visit: Payer: Self-pay | Admitting: *Deleted

## 2022-11-26 ENCOUNTER — Ambulatory Visit: Payer: Medicare HMO | Admitting: Nurse Practitioner

## 2022-11-26 ENCOUNTER — Ambulatory Visit: Payer: Medicare HMO | Admitting: Physician Assistant

## 2022-11-26 VITALS — BP 140/70 | HR 68 | Ht 61.0 in | Wt 92.2 lb

## 2022-11-26 DIAGNOSIS — I7025 Atherosclerosis of native arteries of other extremities with ulceration: Secondary | ICD-10-CM

## 2022-11-26 DIAGNOSIS — R0989 Other specified symptoms and signs involving the circulatory and respiratory systems: Secondary | ICD-10-CM

## 2022-11-26 DIAGNOSIS — Z79899 Other long term (current) drug therapy: Secondary | ICD-10-CM | POA: Diagnosis not present

## 2022-11-26 DIAGNOSIS — I480 Paroxysmal atrial fibrillation: Secondary | ICD-10-CM

## 2022-11-26 DIAGNOSIS — I10 Essential (primary) hypertension: Secondary | ICD-10-CM

## 2022-11-26 DIAGNOSIS — I739 Peripheral vascular disease, unspecified: Secondary | ICD-10-CM

## 2022-11-26 DIAGNOSIS — I5023 Acute on chronic systolic (congestive) heart failure: Secondary | ICD-10-CM | POA: Diagnosis not present

## 2022-11-26 DIAGNOSIS — J449 Chronic obstructive pulmonary disease, unspecified: Secondary | ICD-10-CM

## 2022-11-26 DIAGNOSIS — Z7901 Long term (current) use of anticoagulants: Secondary | ICD-10-CM

## 2022-11-26 MED ORDER — FUROSEMIDE 20 MG PO TABS: 40.0000 mg | ORAL_TABLET | Freq: Every day | ORAL | 3 refills | Status: DC

## 2022-11-26 MED ORDER — POTASSIUM CHLORIDE CRYS ER 10 MEQ PO TBCR: 20.0000 meq | EXTENDED_RELEASE_TABLET | Freq: Every day | ORAL | 3 refills | Status: DC

## 2022-11-26 NOTE — Progress Notes (Signed)
Cardiology Office Note:  .   Date:  11/26/2022  ID:  [name], DOB 04-22-1940, MRN [id number] PCP: Benita Stabile, MD  [facility] Providers Cardiologist:  Charlton Haws, MD    Patient Profile: .      PMH CAD LHC 10/14/2009: Nonobstructive CAD Chronic HFrEF: Echo 05/24/22 EF 40 to 45%, global HK, mild asymmetric LVH of the septal segment, indeterminate diastolic parameters, mildly elevated pulmonary artery systolic pressure, moderate LAE, mild to moderate MR, mild AI, aortic valve sclerosis without stenosis COPD CKD Stage 3b Hypertension CVA Essential tremors Atrial fibrillation on chronic anticoagulation GI bleed 03/16/2021 on coumadin, changed to Eliquis PVD Chronic leg wounds Abdominal aortic gram with lower extremities 12/19/2021 Stent to left superficial femoral artery >> drug coated balloon angioplasty left popliteal artery Angioplasty left peroneal artery S/p left great to amputation 12/29/21 Arterial ultrasound LE and ABI 01/15/2022 Left patent stent with no stenosis 30 to 49% mid popliteal artery stenosis Normal ABI/TBI on the right Normal ABI on the left Carotid artery disease Right subclavian steal with occluded right innominate failed to previous stenting Carotid ultrasound 03/16/2021 Rt carotid: Similar tardus parvus waveforms throughout the right carotid system suggestive of severe stenosis Lt ICA stenosis remains 50-69%, unchanged retrograde flow in the right vertebral artery, normal antegrade flow in the left vertebral artery  She is a longtime patient of Dr. Eden Emms followed for history as outlined above.  She had volume overload with newly reduced EF during admission for sepsis pneumonia and volume overload 06/2022.  She was found to be in atypical flutter and underwent diuresis but no attempts made at DCCV.  She was found to be in rapid atrial flutter in the office 07/04/2022 and was willing to retry amiodarone as she had worsening tremors even when it was  stopped. Plan for amiodarone load and DCCV.  Admission 8/10-8/11/24 for shortness of breath. She presented mildly tachypneic with O2 sat 88% on room air, elevated BP.  CXR notable for cardiomegaly with pulmonary vascular congestion and small bilateral pleural effusions.  Creatinine 1.32 at baseline.  D-dimer 0.98.  She was diagnosed with acute on chronic congestive heart failure and admitted with IV diuresis.  She was discharged on Lasix 20 mg twice daily for 3 days then 20 mg daily.        History of Present Illness: .   [name] is a very pleasant 82 y.o. female who is here with her husband for follow-up. She is in a wheelchair. She reports she is frustrated by how quickly her breathing worsens after being hospitalized. She reports that within a few days of hospital discharge, she starts to have increased shortness of breath and orthopnea. Unsure if she has PND because she wakes up when she turns on her side. She feels it is worsened by lower dose of amiodarone. Has been off and on amiodarone for a fib for years. Amiodarone thought to make tremors worse, so she tried a lower dose and then being off altogether. Reports she would rather have the tremor than be fluid overloaded. Now has gloves that help steady her hands so that she can eat.  Reports weight has been stable at home.  She denies edema, chest pain, presyncope, and syncope. She admits to high sodium diet although she eats small portions.    ROS: See HPI       Studies Reviewed: .        Risk Assessment/Calculations:    CHA2DS2-VASc Score = 8  This indicates a 10.8% annual risk of stroke. The patient's score is based upon: CHF History: 1 HTN History: 1 Diabetes History: 0 Stroke History: 2 Vascular Disease History: 1 Age Score: 2 Gender Score: 1    HYPERTENSION CONTROL Vitals:   11/26/22 1435 11/26/22 1518  BP: (!) 160/60 (!) 140/70    The patient's blood pressure is elevated above target today.  In order to address  the patient's elevated BP: A current anti-hypertensive medication was adjusted today.          Physical Exam:   VS:  BP (!) 140/70   Pulse 68   Ht 5\' 1"  (1.549 m)   Wt 92 lb 3.2 oz (41.8 kg)   SpO2 92%   BMI 17.42 kg/m    Wt Readings from Last 3 Encounters:  11/26/22 92 lb 3.2 oz (41.8 kg)  11/19/22 90 lb 4.8 oz (41 kg)  11/18/22 92 lb (41.7 kg)    GEN: Well nourished, well developed in no acute distress NECK: No JVD; No carotid bruits CARDIAC: RRR, no murmurs, rubs, gallops RESPIRATORY:  Clear to auscultation without rales, wheezing or rhonchi  ABDOMEN: Soft, non-tender, non-distended EXTREMITIES:  No edema; No deformity     ASSESSMENT AND PLAN: .    Acute on chronic HFrEF: LVEF 40-45% on echo 05/24/22. Recent admission with volume overload with pulmonary congestion and small bilateral pleural effusions on CXR. She had improvement with IV Lasix and was discharged on Lasix 20 mg BID. She has been hesitant to take the 2nd dose of Lasix 20 mg because she thought she had to take it late in the day.  She is having orthopnea and possibly PND. Weight is stable. I have advised her to take Lasix 40 mg in the mornings. We will also increase her potassium supplement.  Advised her to weigh daily at the same time and in the same clothing and to notify us if she has weight increase > 2 lbs in one day. We will check a BMP in one week.  She admits to high sodium diet although she eats small amounts.  I have encouraged her to limit sodium intake to < 2000 mg daily. Continue metoprolol, furosemide.   PAF on chronic anticoagulation: HR is well controlled. Clinically appears to be maintaining sinus rhythm. She feels symptoms of CHF worsen when she is off amiodarone. She would like to resume amiodarone at 200 mg daily.  I reviewed recent liver panel lab work from PCP 11/15/22 and liver function is stable. We briefly discussed the long term impact of amiodarone on lung function. I advised her to notify us for  further dose instructions if this causes significant tremors. She thinks she would rather deal with the tremors than be off amiodarone.   COPD: No increased WOB presently. She feels that her COPD has been stable. As noted above, SOB, orthopnea felt to be exacerbated by CHF. Management per pulmonology.   Hypertension: BP elevated initially, slightly improved on my recheck. Higher dose of Lasix will likely cause slight decrease/improvement in BP. No additional changes today.   PVD: She has extensive peripheral vascular disease as noted above. No acute concerns today.  Management per Dr. Myra Gianotti.        Dispo: Keep your November appointment with Dr. Eden Emms  Signed, Eligha Bridegroom, NP-C

## 2022-11-26 NOTE — Patient Instructions (Signed)
Medication Instructions:   INCREASE Lasix two (2) tablets by mouth ( 40 mg ) daily.  INCREASE K-dur tow(2) tablets by mouth ( 20 mEq) daily.   *If you need a refill on your cardiac medications before your next appointment, please call your pharmacy*   Lab Work:  Your physician recommends that you return for lab work in one week in [place].    If you have labs (blood work) drawn today and your tests are completely normal, you will receive your results only by: MyChart Message (if you have MyChart) OR A paper copy in the mail If you have any lab test that is abnormal or we need to change your treatment, we will call you to review the results.   Testing/Procedures:  Keep all upcoming test appointments.    Follow-Up: At [facility], you and your health needs are our priority.  As part of our continuing mission to provide you with exceptional heart care, we have created designated Provider Care Teams.  These Care Teams include your primary Cardiologist (physician) and Advanced Practice Providers (APPs -  Physician Assistants and Nurse Practitioners) who all work together to provide you with the care you need, when you need it.  We recommend signing up for the patient portal called "MyChart".  Sign up information is provided on this After Visit Summary.  MyChart is used to connect with patients for Virtual Visits (Telemedicine).  Patients are able to view lab/test results, encounter notes, upcoming appointments, etc.  Non-urgent messages can be sent to your provider as well.   To learn more about what you can do with MyChart, go to [email or web address].    Your next appointment:   3 month(s)  Provider:   Charlton Haws, MD

## 2022-11-28 ENCOUNTER — Telehealth: Payer: Self-pay | Admitting: Cardiovascular Disease

## 2022-11-28 ENCOUNTER — Other Ambulatory Visit: Payer: Self-pay

## 2022-11-28 MED ORDER — ALBUTEROL SULFATE HFA 108 (90 BASE) MCG/ACT IN AERS: 2.0000 | INHALATION_SPRAY | Freq: Four times a day (QID) | RESPIRATORY_TRACT | 1 refills | Status: DC | PRN

## 2022-11-28 NOTE — Telephone Encounter (Signed)
Script sent  

## 2022-11-28 NOTE — Telephone Encounter (Signed)
Patient is calling for her prescription Albuterol. She says she was in on the 26th of July and have not received it yet.

## 2022-11-28 NOTE — Telephone Encounter (Signed)
New Message:      Please call patient about an appointment

## 2022-11-28 NOTE — Telephone Encounter (Signed)
Patient states she was supposed to follow-up with Dr. Eden Emms in September for her 6 month follow-up visit. The schedule was not open at the time in March and she was instructed to call back in July. She states she did this and no openings were available until November. She is not pleased with this and would like to see Dr. Eden Emms in September, she was instructed by someone at our office to speak with Pam I.  Patient placed on waitlist.  She saw Eligha Bridegroom, NP 11/26/22, she states this was to straighten out her medication issues.  Will forward to Dr. Fabio Bering nurse to follow-up on.

## 2022-11-29 NOTE — Telephone Encounter (Signed)
Called patient's husband and informed him that patient is fine to follow-up in November on the date she is already scheduled. Patient's husband verbalized understanding.

## 2022-12-03 ENCOUNTER — Ambulatory Visit: Payer: Medicare HMO | Admitting: Cardiology

## 2022-12-04 ENCOUNTER — Telehealth: Payer: Self-pay | Admitting: Nurse Practitioner

## 2022-12-04 LAB — BASIC METABOLIC PANEL
BUN/Creatinine Ratio: 22 (ref 12–28)
BUN: 32 mg/dL — ABNORMAL HIGH (ref 8–27)
CO2: 19 mmol/L — ABNORMAL LOW (ref 20–29)
Calcium: 9.8 mg/dL (ref 8.7–10.3)
Chloride: 101 mmol/L (ref 96–106)
Creatinine, Ser: 1.46 mg/dL — ABNORMAL HIGH (ref 0.57–1.00)
Glucose: 101 mg/dL — ABNORMAL HIGH (ref 70–99)
Potassium: 4.7 mmol/L (ref 3.5–5.2)
Sodium: 140 mmol/L (ref 134–144)
eGFR: 35 mL/min/{1.73_m2} — ABNORMAL LOW (ref >59)

## 2022-12-04 NOTE — Telephone Encounter (Signed)
-----   Message from Levi Aland sent at 12/04/2022  5:13 AM EDT ----- Creatinine is elevated on higher dose of Lasix but stable. Electrolytes are stable. Please ask how she is feeling, can she tell an improvement with taking Lasix 40 mg daily rather then 20 mg twice daily? If feeling well, plan is to continue current medications.

## 2022-12-04 NOTE — Telephone Encounter (Signed)
I spoke with patient and her husband and reviewed lab results with them.  They report patient did not make change to lasix dosing because they were never told to do so and no changes were made at recent office visit. They state no changes were made to prescription.  Last office note recommendations reviewed with patient and her husband.  They thought patient was to continue twice daily but just change the times.  Patient has been taking lasix 20 mg at 5 AM and 2 PM.  Patient is doing well on this dose.  I told them to continue this dose and office would call her back if any changes needed.

## 2022-12-04 NOTE — Telephone Encounter (Signed)
Pt was returning nurse call and is now requesting a callback regarding her results. Please advise

## 2022-12-05 NOTE — Telephone Encounter (Signed)
No additional changes needed, thank you.

## 2022-12-11 ENCOUNTER — Encounter (HOSPITAL_COMMUNITY): Admission: RE | Admit: 2022-12-11 | Payer: Medicare HMO | Source: Ambulatory Visit

## 2022-12-11 ENCOUNTER — Encounter: Payer: Medicare HMO | Attending: Internal Medicine | Admitting: Emergency Medicine

## 2022-12-11 VITALS — BP 167/99 | HR 58 | Temp 97.6°F | Resp 18

## 2022-12-11 DIAGNOSIS — M810 Age-related osteoporosis without current pathological fracture: Secondary | ICD-10-CM

## 2022-12-11 MED ORDER — DENOSUMAB 60 MG/ML ~~LOC~~ SOSY
60.0000 mg | PREFILLED_SYRINGE | Freq: Once | SUBCUTANEOUS | Status: AC
  Administered 2022-12-11: 60 mg via SUBCUTANEOUS

## 2022-12-11 NOTE — Progress Notes (Signed)
 Diagnosis: Osteoporosis  Provider:  Dwana Melena MD  Procedure: Injection  Prolia (Denosumab), Dose: 60 mg, Site: subcutaneous, Number of injections: 1  Post Care: Patient declined observation  Discharge: Condition: Good, Destination: Home . AVS Provided  Performed by:  Arrie Senate, RN

## 2022-12-18 NOTE — Telephone Encounter (Signed)
[name], you put paperwork on my desk regarding readi steadi and patient's husband wrote a letter about the interested in it.  While I really do not have any objection to that, I am not sure it is going to help.  Nonetheless, he asked if we can assist.  They would need an appointment, as we would need to measure her hand and trace the hand and do some measurements.  However, we cannot do the video for them because there are some HIPAA requirements that we have regarding video uploads.  We can help them to video her, but we are not able to upload it anywhere for them.  If he has a smart phone, we can certainly help him get the video onto her phone during an appointment.  Nonetheless, they would need an appointment to do all of these things.

## 2022-12-19 NOTE — Telephone Encounter (Signed)
Called patients husband about the appointment needed for the redi steadi unable to reach left detailed message REGARDING THE NEED FOR AN APPOINTMENT

## 2022-12-26 ENCOUNTER — Ambulatory Visit (INDEPENDENT_AMBULATORY_CARE_PROVIDER_SITE_OTHER): Payer: Medicare HMO | Admitting: Podiatry

## 2022-12-26 DIAGNOSIS — L97322 Non-pressure chronic ulcer of left ankle with fat layer exposed: Secondary | ICD-10-CM | POA: Diagnosis not present

## 2022-12-26 NOTE — Progress Notes (Signed)
Subjective:  Patient ID: [name], female    DOB: 1940/06/17,  MRN: [id number]  Chief Complaint  Patient presents with   Nail Problem     82 y.o. female returns for post-op check.  Patient states that she is doing okay.  She states that the top of the foot is still doing better.  Ankle wound is still present.  Wound care nurses helping  Review of Systems: Negative except as noted in the HPI. Denies N/V/F/Ch.  Past Medical History:  Diagnosis Date   Anemia 05/2014.   Atrial fibrillation (HCC)    x 3 yrs   Breast cancer (HCC)    S/P left mastectomy and chemotherapy 1989 remained in remission   Carotid artery occlusion    Carotid bruit    LICA 60-79% (duplex 9/11)   Degenerative arthritis of spine 2015   Chronic back pain   Hyperlipidemia    Hypertension    Meningioma (HCC)    Osteoporosis    Pneumonia May 19, 2014   Stroke [facility])    CVA 2008   Subclavian steal syndrome    Ulcers of both lower extremities (HCC) 2015    Current Outpatient Medications:    albuterol (VENTOLIN HFA) 108 (90 Base) MCG/ACT inhaler, Inhale 2 puffs into the lungs every 6 (six) hours as needed for wheezing or shortness of breath., Disp: 18 g, Rfl: 1   AMBULATORY NON FORMULARY MEDICATION, Dispense one Readi Steadi glove, Disp: 1 Units, Rfl: 0   amiodarone (PACERONE) 200 MG tablet, Take 0.5 tablets (100 mg total) by mouth every other day., Disp: , Rfl:    apixaban (ELIQUIS) 2.5 MG TABS tablet, Take 1 tablet (2.5 mg total) by mouth 2 (two) times daily., Disp: 180 tablet, Rfl: 1   ascorbic acid (VITAMIN C) 500 MG tablet, Take 500 mg by mouth in the morning., Disp: , Rfl:    CALCIUM PO, Take 1,200 mg by mouth 2 (two) times daily., Disp: , Rfl:    Camphor-Menthol-Methyl Sal (SALONPAS) 3.04-14-08 % PTCH, Place 1 patch onto the skin daily as needed (pain.)., Disp: , Rfl:    Cholecalciferol (VITAMIN D-3) 25 MCG (1000 UT) CAPS, Take 1,000-2,000 tablets by mouth See admin instructions. Take 1000 units  tablet by mouth in the morning & take 78295 units tablets by mouth at night., Disp: , Rfl:    clopidogrel (PLAVIX) 75 MG tablet, TAKE 1 TABLET EVERY MORNING, Disp: 90 tablet, Rfl: 2   denosumab (PROLIA) 60 MG/ML SOSY injection, Inject 60 mg into the skin every 6 (six) months., Disp: , Rfl:    Ferrous Sulfate (IRON PO), Take 65 mg by mouth at bedtime., Disp: , Rfl:    furosemide (LASIX) 20 MG tablet, Take 2 tablets (40 mg total) by mouth daily., Disp: 120 tablet, Rfl: 3   MAGNESIUM OXIDE PO, Take 500 mg by mouth every morning., Disp: , Rfl:    metoprolol tartrate (LOPRESSOR) 25 MG tablet, Take 3 tablets (75 mg total) by mouth 2 (two) times daily., Disp: 540 tablet, Rfl: 2   Multiple Vitamin (MULTIVITAMIN WITH MINERALS) TABS tablet, Take 1 tablet by mouth every morning., Disp: , Rfl:    pantoprazole (PROTONIX) 40 MG tablet, Take 1 tablet (40 mg total) by mouth daily., Disp: 90 tablet, Rfl: 2   potassium chloride (KLOR-CON M) 10 MEQ tablet, Take 2 tablets (20 mEq total) by mouth daily., Disp: 120 tablet, Rfl: 3   pravastatin (PRAVACHOL) 40 MG tablet, TAKE 1 TABLET DAILY, Disp: 90 tablet, Rfl: 3  SANTYL 250 UNIT/GM ointment, APPLY TO AFFECTED AREAS ONCE DAILY., Disp: 30 g, Rfl: 0   umeclidinium bromide (INCRUSE ELLIPTA) 62.5 MCG/ACT AEPB, Inhale 1 puff into the lungs daily., Disp: 90 each, Rfl: 3  Social History   Tobacco Use  Smoking Status Former   Current packs/day: 0.00   Average packs/day: 1 pack/day for 56.0 years (56.0 ttl pk-yrs)   Types: Cigarettes   Start date: 08/14/1950   Quit date: 08/14/2006   Years since quitting: 16.3   Passive exposure: Never  Smokeless Tobacco Never  Tobacco Comments   quit in 2008    Allergies  Allergen Reactions   Iodine Rash and Other (See Comments)    BETADINE Rash/burning, blisters on skin. Burns skin   Iohexol Rash and Other (See Comments)    Blisters; PT NEEDS 13-HOUR PREP    Sulfa Antibiotics Nausea And Vomiting   Petroleum Gauze Non-Woven  3x9" [Wound Dressings] Other (See Comments)    BURNING SENSATION, RED SKIN   Povidone-Iodine Other (See Comments)    Burns skin   Wound Dressing Adhesive Other (See Comments)    Burns skin   Tape Itching, Rash and Other (See Comments)    "DO NOT USE ADHESIVE TAPE" Not even a Band Aid" NO PAPER TAPE Burns skin Skin is very very thin   Objective:   There were no vitals filed for this visit.  There is no height or weight on file to calculate BMI. Constitutional Well developed. Well nourished.  Vascular Foot warm and well perfused. Capillary refill normal to all digits.   Neurologic Normal speech. Oriented to person, place, and time. Epicritic sensation to light touch grossly present bilaterally.  Dermatologic  Left lateral ankle with fat layer exposed.  No signs of infection noted.  No malodor present does not probe down to bone.  Orthopedic: No tenderness to palpation noted about the surgical site.   Radiographs: None Assessment:   No diagnosis found.     Plan:  Patient was evaluated and treated and all questions answered.  S/p foot surgery left -Clinic healed.  No signs of dehiscence noted.  No complications noted.  Left dorsal foot wound with fat layer exposed -Clinically healed  Left lateral ankle with fat layer exposed -Continue Santyl wet-to-dry dressing.  Appears to be slowly getting better.  Will continue monitor the progression of it.  Minimal debridement was carried out with mechanical debridement. -Will hold off on Unna boot compression therapy continue center wet-to-dry  Left generalized edema -Unna boot compression was applied in standard technique No follow-ups on file.    No follow-ups on file.

## 2023-02-06 ENCOUNTER — Ambulatory Visit (INDEPENDENT_AMBULATORY_CARE_PROVIDER_SITE_OTHER): Payer: Medicare HMO | Admitting: Podiatry

## 2023-02-06 DIAGNOSIS — L97322 Non-pressure chronic ulcer of left ankle with fat layer exposed: Secondary | ICD-10-CM

## 2023-02-06 DIAGNOSIS — I999 Unspecified disorder of circulatory system: Secondary | ICD-10-CM

## 2023-02-06 NOTE — Progress Notes (Signed)
Subjective:  Patient ID: [name], female    DOB: [birth year],  MRN: [id number]  Chief Complaint  Patient presents with   Foot Ulcer    Follow up on left foot ulcer patient has swelling states not doing well.     82 y.o. female returns for post-op check.  Patient states that she is doing okay.  She states that the top of the foot is still doing better.  Ankle wound is still present.  Wound care nurses helping  Review of Systems: Negative except as noted in the HPI. Denies N/V/F/Ch.  Past Medical History:  Diagnosis Date   Anemia 05/2014.   Atrial fibrillation (HCC)    x 3 yrs   Breast cancer (HCC)    S/P left mastectomy and chemotherapy 1989 remained in remission   Carotid artery occlusion    Carotid bruit    LICA 60-79% (duplex 9/11)   Degenerative arthritis of spine 2015   Chronic back pain   Hyperlipidemia    Hypertension    Meningioma (HCC)    Osteoporosis    Pneumonia May 19, 2014   Stroke [facility])    CVA 2008   Subclavian steal syndrome    Ulcers of both lower extremities (HCC) 2015    Current Outpatient Medications:    albuterol (VENTOLIN HFA) 108 (90 Base) MCG/ACT inhaler, Inhale 2 puffs into the lungs every 6 (six) hours as needed for wheezing or shortness of breath., Disp: 18 g, Rfl: 1   AMBULATORY NON FORMULARY MEDICATION, Dispense one Readi Steadi glove, Disp: 1 Units, Rfl: 0   amiodarone (PACERONE) 200 MG tablet, Take 0.5 tablets (100 mg total) by mouth every other day. (Patient taking differently: Take 200 mg by mouth daily.), Disp: , Rfl:    apixaban (ELIQUIS) 2.5 MG TABS tablet, Take 1 tablet (2.5 mg total) by mouth 2 (two) times daily., Disp: 180 tablet, Rfl: 1   ascorbic acid (VITAMIN C) 500 MG tablet, Take 500 mg by mouth in the morning., Disp: , Rfl:    CALCIUM PO, Take 1,200 mg by mouth 2 (two) times daily., Disp: , Rfl:    Camphor-Menthol-Methyl Sal (SALONPAS) 3.04-14-08 % PTCH, Place 1 patch onto the skin daily as needed (pain.)., Disp: , Rfl:     Cholecalciferol (VITAMIN D-3) 25 MCG (1000 UT) CAPS, Take 1,000 Units by mouth 2 (two) times daily., Disp: , Rfl:    clopidogrel (PLAVIX) 75 MG tablet, TAKE 1 TABLET EVERY MORNING, Disp: 90 tablet, Rfl: 2   denosumab (PROLIA) 60 MG/ML SOSY injection, Inject 60 mg into the skin every 6 (six) months., Disp: , Rfl:    Ferrous Sulfate (IRON PO), Take 65 mg by mouth at bedtime., Disp: , Rfl:    furosemide (LASIX) 20 MG tablet, Take 2 tablets (40 mg total) by mouth daily. (Patient taking differently: Take 20 mg by mouth 2 (two) times daily.), Disp: 120 tablet, Rfl: 3   MAGNESIUM OXIDE PO, Take 500 mg by mouth every morning., Disp: , Rfl:    metoprolol tartrate (LOPRESSOR) 25 MG tablet, Take 3 tablets (75 mg total) by mouth 2 (two) times daily., Disp: 540 tablet, Rfl: 2   Multiple Vitamin (MULTIVITAMIN WITH MINERALS) TABS tablet, Take 1 tablet by mouth every morning., Disp: , Rfl:    pantoprazole (PROTONIX) 40 MG tablet, Take 1 tablet (40 mg total) by mouth daily., Disp: 90 tablet, Rfl: 2   potassium chloride (KLOR-CON M) 10 MEQ tablet, Take 2 tablets (20 mEq total) by mouth daily., Disp:  120 tablet, Rfl: 3   pravastatin (PRAVACHOL) 40 MG tablet, TAKE 1 TABLET DAILY, Disp: 90 tablet, Rfl: 3   SANTYL 250 UNIT/GM ointment, APPLY TO AFFECTED AREAS ONCE DAILY. (Patient taking differently: Apply 1 Application topically 2 (two) times daily.), Disp: 30 g, Rfl: 0   umeclidinium bromide (INCRUSE ELLIPTA) 62.5 MCG/ACT AEPB, Inhale 1 puff into the lungs daily., Disp: 90 each, Rfl: 3  Social History   Tobacco Use  Smoking Status Former   Current packs/day: 0.00   Average packs/day: 1 pack/day for 56.0 years (56.0 ttl pk-yrs)   Types: Cigarettes   Start date: 08/14/1950   Quit date: 08/14/2006   Years since quitting: 16.5   Passive exposure: Never  Smokeless Tobacco Never  Tobacco Comments   quit in 2008    Allergies  Allergen Reactions   Iodine Rash and Other (See Comments)    BETADINE Rash/burning,  blisters on skin. Burns skin   Iohexol Rash and Other (See Comments)    Blisters; PT NEEDS 13-HOUR PREP    Sulfa Antibiotics Nausea And Vomiting   Petroleum Gauze Non-Woven 3x9" [Wound Dressings] Other (See Comments)    BURNING SENSATION, RED SKIN   Povidone-Iodine Other (See Comments)    Burns skin   Wound Dressing Adhesive Other (See Comments)    Burns skin   Tape Itching, Rash and Other (See Comments)    "DO NOT USE ADHESIVE TAPE" Not even a Band Aid" NO PAPER TAPE Burns skin Skin is very very thin   Objective:   There were no vitals filed for this visit.  There is no height or weight on file to calculate BMI. Constitutional Well developed. Well nourished.  Vascular Foot warm and well perfused. Capillary refill normal to all digits.   Neurologic Normal speech. Oriented to person, place, and time. Epicritic sensation to light touch grossly present bilaterally.  Dermatologic  Left lateral ankle with fat layer exposed.  No signs of infection noted.  No malodor present does not probe down to bone.  Orthopedic: No tenderness to palpation noted about the surgical site.   Radiographs: None Assessment:   No diagnosis found.     Plan:  Patient was evaluated and treated and all questions answered.  S/p foot surgery left -Clinic healed.  No signs of dehiscence noted.  No complications noted.  Left dorsal foot wound with fat layer exposed -Clinically healed  Left lateral ankle with fat layer exposed -Continue Santyl wet-to-dry dressing.  Appears to be slowly getting better.  Will continue monitor the progression of it.  Minimal debridement was carried out with mechanical debridement. -Continue wet-to-dry therapy the wound appears to be worsening -Patient may benefit from vascular consult to see if there is any further intervention that could be done to the right leg.  She has a history of vascular surgery intervention to the right foot -Upon reviewing chart patient  scheduled to undergo angiogram on 02/26/2023  Left generalized edema -Unna boot compression was applied in standard technique No follow-ups on file.    No follow-ups on file.

## 2023-02-06 NOTE — H&P (View-Only) (Signed)
Subjective:  Patient ID: [name], female    DOB: July 17, 1940,  MRN: [id number]  Chief Complaint  Patient presents with   Foot Ulcer    Follow up on left foot ulcer patient has swelling states not doing well.     82 y.o. female returns for post-op check.  Patient states that she is doing okay.  She states that the top of the foot is still doing better.  Ankle wound is still present.  Wound care nurses helping  Review of Systems: Negative except as noted in the HPI. Denies N/V/F/Ch.  Past Medical History:  Diagnosis Date   Anemia 05/2014.   Atrial fibrillation (HCC)    x 3 yrs   Breast cancer (HCC)    S/P left mastectomy and chemotherapy 1989 remained in remission   Carotid artery occlusion    Carotid bruit    LICA 60-79% (duplex 9/11)   Degenerative arthritis of spine 2015   Chronic back pain   Hyperlipidemia    Hypertension    Meningioma (HCC)    Osteoporosis    Pneumonia May 19, 2014   Stroke [facility])    CVA 2008   Subclavian steal syndrome    Ulcers of both lower extremities (HCC) 2015    Current Outpatient Medications:    albuterol (VENTOLIN HFA) 108 (90 Base) MCG/ACT inhaler, Inhale 2 puffs into the lungs every 6 (six) hours as needed for wheezing or shortness of breath., Disp: 18 g, Rfl: 1   AMBULATORY NON FORMULARY MEDICATION, Dispense one Readi Steadi glove, Disp: 1 Units, Rfl: 0   amiodarone (PACERONE) 200 MG tablet, Take 0.5 tablets (100 mg total) by mouth every other day. (Patient taking differently: Take 200 mg by mouth daily.), Disp: , Rfl:    apixaban (ELIQUIS) 2.5 MG TABS tablet, Take 1 tablet (2.5 mg total) by mouth 2 (two) times daily., Disp: 180 tablet, Rfl: 1   ascorbic acid (VITAMIN C) 500 MG tablet, Take 500 mg by mouth in the morning., Disp: , Rfl:    CALCIUM PO, Take 1,200 mg by mouth 2 (two) times daily., Disp: , Rfl:    Camphor-Menthol-Methyl Sal (SALONPAS) 3.04-14-08 % PTCH, Place 1 patch onto the skin daily as needed (pain.)., Disp: , Rfl:     Cholecalciferol (VITAMIN D-3) 25 MCG (1000 UT) CAPS, Take 1,000 Units by mouth 2 (two) times daily., Disp: , Rfl:    clopidogrel (PLAVIX) 75 MG tablet, TAKE 1 TABLET EVERY MORNING, Disp: 90 tablet, Rfl: 2   denosumab (PROLIA) 60 MG/ML SOSY injection, Inject 60 mg into the skin every 6 (six) months., Disp: , Rfl:    Ferrous Sulfate (IRON PO), Take 65 mg by mouth at bedtime., Disp: , Rfl:    furosemide (LASIX) 20 MG tablet, Take 2 tablets (40 mg total) by mouth daily. (Patient taking differently: Take 20 mg by mouth 2 (two) times daily.), Disp: 120 tablet, Rfl: 3   MAGNESIUM OXIDE PO, Take 500 mg by mouth every morning., Disp: , Rfl:    metoprolol tartrate (LOPRESSOR) 25 MG tablet, Take 3 tablets (75 mg total) by mouth 2 (two) times daily., Disp: 540 tablet, Rfl: 2   Multiple Vitamin (MULTIVITAMIN WITH MINERALS) TABS tablet, Take 1 tablet by mouth every morning., Disp: , Rfl:    pantoprazole (PROTONIX) 40 MG tablet, Take 1 tablet (40 mg total) by mouth daily., Disp: 90 tablet, Rfl: 2   potassium chloride (KLOR-CON M) 10 MEQ tablet, Take 2 tablets (20 mEq total) by mouth daily., Disp:  120 tablet, Rfl: 3   pravastatin (PRAVACHOL) 40 MG tablet, TAKE 1 TABLET DAILY, Disp: 90 tablet, Rfl: 3   SANTYL 250 UNIT/GM ointment, APPLY TO AFFECTED AREAS ONCE DAILY. (Patient taking differently: Apply 1 Application topically 2 (two) times daily.), Disp: 30 g, Rfl: 0   umeclidinium bromide (INCRUSE ELLIPTA) 62.5 MCG/ACT AEPB, Inhale 1 puff into the lungs daily., Disp: 90 each, Rfl: 3  Social History   Tobacco Use  Smoking Status Former   Current packs/day: 0.00   Average packs/day: 1 pack/day for 56.0 years (56.0 ttl pk-yrs)   Types: Cigarettes   Start date: 08/14/1950   Quit date: 08/14/2006   Years since quitting: 16.5   Passive exposure: Never  Smokeless Tobacco Never  Tobacco Comments   quit in 2008    Allergies  Allergen Reactions   Iodine Rash and Other (See Comments)    BETADINE Rash/burning,  blisters on skin. Burns skin   Iohexol Rash and Other (See Comments)    Blisters; PT NEEDS 13-HOUR PREP    Sulfa Antibiotics Nausea And Vomiting   Petroleum Gauze Non-Woven 3x9" [Wound Dressings] Other (See Comments)    BURNING SENSATION, RED SKIN   Povidone-Iodine Other (See Comments)    Burns skin   Wound Dressing Adhesive Other (See Comments)    Burns skin   Tape Itching, Rash and Other (See Comments)    "DO NOT USE ADHESIVE TAPE" Not even a Band Aid" NO PAPER TAPE Burns skin Skin is very very thin   Objective:   There were no vitals filed for this visit.  There is no height or weight on file to calculate BMI. Constitutional Well developed. Well nourished.  Vascular Foot warm and well perfused. Capillary refill normal to all digits.   Neurologic Normal speech. Oriented to person, place, and time. Epicritic sensation to light touch grossly present bilaterally.  Dermatologic  Left lateral ankle with fat layer exposed.  No signs of infection noted.  No malodor present does not probe down to bone.  Orthopedic: No tenderness to palpation noted about the surgical site.   Radiographs: None Assessment:   No diagnosis found.     Plan:  Patient was evaluated and treated and all questions answered.  S/p foot surgery left -Clinic healed.  No signs of dehiscence noted.  No complications noted.  Left dorsal foot wound with fat layer exposed -Clinically healed  Left lateral ankle with fat layer exposed -Continue Santyl wet-to-dry dressing.  Appears to be slowly getting better.  Will continue monitor the progression of it.  Minimal debridement was carried out with mechanical debridement. -Continue wet-to-dry therapy the wound appears to be worsening -Patient may benefit from vascular consult to see if there is any further intervention that could be done to the right leg.  She has a history of vascular surgery intervention to the right foot -Upon reviewing chart patient  scheduled to undergo angiogram on 02/26/2023  Left generalized edema -Unna boot compression was applied in standard technique No follow-ups on file.    No follow-ups on file.

## 2023-02-12 ENCOUNTER — Ambulatory Visit (INDEPENDENT_AMBULATORY_CARE_PROVIDER_SITE_OTHER): Payer: Medicare HMO

## 2023-02-12 ENCOUNTER — Ambulatory Visit (INDEPENDENT_AMBULATORY_CARE_PROVIDER_SITE_OTHER): Payer: Medicare HMO | Admitting: Vascular Surgery

## 2023-02-12 ENCOUNTER — Encounter: Payer: Self-pay | Admitting: Vascular Surgery

## 2023-02-12 VITALS — BP 192/83 | HR 65 | Temp 97.6°F | Ht 61.0 in | Wt 92.2 lb

## 2023-02-12 DIAGNOSIS — I739 Peripheral vascular disease, unspecified: Secondary | ICD-10-CM

## 2023-02-12 NOTE — Progress Notes (Signed)
Patient name: MAEGHAN CANNY MRN: [id number] DOB: 03-06-1941 Sex: female  REASON FOR CONSULT: Non-healing right leg ulcer  HPI: KEYONTA MADRID is a 82 y.o. female, with history of atrial fibrillation, hypertension, hyperlipidemia that presents for follow-up of a worsening right ankle ulcer.  She has had a wound to her right lower extremity for at least the last year.  This is followed by Dr. Allena Katz with podiatry.  She is putting Santyl on the wound.  This has gotten worse recently according to the husband who is doing most of the wound care.  She is well-known to our practice and previously followed by Dr. Myra Gianotti.  On 12/19/2021 she had a left SFA stent as well as a left popliteal DCB and angioplasty of the peroneal.  On 08/28/2022 she had a laser arthrectomy of the left popliteal and anterior tibial by Dr. Myra Gianotti.  As recently as 10/16/2022 she had laser atherectomy of her right anterior tibial for right foot ulcer with known SFA disease and no intervention was performed on her SFA disease.  Past Medical History:  Diagnosis Date   Anemia 05/2014.   Atrial fibrillation (HCC)    x 3 yrs   Breast cancer (HCC)    S/P left mastectomy and chemotherapy 1989 remained in remission   Carotid artery occlusion    Carotid bruit    LICA 60-79% (duplex 9/11)   Degenerative arthritis of spine 2015   Chronic back pain   Hyperlipidemia    Hypertension    Meningioma (HCC)    Osteoporosis    Pneumonia May 19, 2014   Stroke [facility])    CVA 2008   Subclavian steal syndrome    Ulcers of both lower extremities (HCC) 2015    Past Surgical History:  Procedure Laterality Date   ABDOMINAL AORTOGRAM W/LOWER EXTREMITY N/A 12/19/2021   Procedure: ABDOMINAL AORTOGRAM W/LOWER EXTREMITY;  Surgeon: Nada Libman, MD;  [facility] IN[facility];  Service: Cardiovascular;  Laterality: N/A;   ABDOMINAL AORTOGRAM W/LOWER EXTREMITY Bilateral 08/28/2022   Procedure: ABDOMINAL AORTOGRAM W/LOWER EXTREMITY;  Surgeon:  Nada Libman, MD;  [facility]: [facility];  Service: Cardiovascular;  Laterality: Bilateral;   ABDOMINAL AORTOGRAM W/LOWER EXTREMITY N/A 10/16/2022   Procedure: ABDOMINAL AORTOGRAM W/LOWER EXTREMITY;  Surgeon: Nada Libman, MD;  [facility];  Service: Cardiovascular;  Laterality: N/A;   AMPUTATION TOE Left 12/29/2021   Procedure: Incisional DEBRIDEMENT Left Foot with Amputation Left Great Toe;  Surgeon: Candelaria Stagers, DPM;  Location: [facility] OR;  Service: Podiatry;  Laterality: Left;   CARDIAC CATHETERIZATION     CATARACT EXTRACTION Bilateral 2013   COLONOSCOPY  11/17/2010   Procedure: COLONOSCOPY;  Surgeon: Malissa Hippo, MD;  [facility]: [facility];  Service: Endoscopy;  Laterality: N/A;  10:45 am   ESOPHAGOGASTRODUODENOSCOPY  11/17/2010   Procedure: ESOPHAGOGASTRODUODENOSCOPY (EGD);  Surgeon: Malissa Hippo, MD;  [facility]: [facility];  Service: Endoscopy;  Laterality: N/A;   ESOPHAGOGASTRODUODENOSCOPY N/A 02/16/2015   Procedure: ESOPHAGOGASTRODUODENOSCOPY (EGD);  Surgeon: Ruffin Frederick, MD;  Location: [facility] ENDOSCOPY;  Service: Gastroenterology;  Laterality: N/A;   EXPLORATORY LAPAROTOMY  1960s   For peritonitis of undetermined cause   EYE SURGERY     LUMBAR EPIDURAL INJECTION  06-2012--06-2013   pt. states she has had 5 epidurals in 06-2012----06-2013   MASTECTOMY Left 1990   PERIPHERAL VASCULAR ATHERECTOMY  08/28/2022   Procedure: PERIPHERAL VASCULAR ATHERECTOMY;  Surgeon: Nada Libman, MD;  Location: [facility]  INVASIVE CV LAB;  Service: Cardiovascular;;  Lt Popliteal and AT   PERIPHERAL VASCULAR BALLOON ANGIOPLASTY  08/28/2022   Procedure: PERIPHERAL VASCULAR BALLOON ANGIOPLASTY;  Surgeon: Nada Libman, MD;  [facility];  Service: Cardiovascular;;  Lt Popliteal and AT   PERIPHERAL VASCULAR BALLOON ANGIOPLASTY  10/16/2022   Procedure: PERIPHERAL VASCULAR BALLOON ANGIOPLASTY;  Surgeon: Nada Libman, MD;  [facility];   Service: Cardiovascular;;  Rt AT   PERIPHERAL VASCULAR INTERVENTION Left 12/19/2021   Procedure: PERIPHERAL VASCULAR INTERVENTION;  Surgeon: Nada Libman, MD;  [facility];  Service: Cardiovascular;  Laterality: Left;  SFA and PTA of POP   YAG LASER APPLICATION Right 05/03/2015   Procedure: YAG LASER APPLICATION;  Surgeon: Jethro Bolus, MD;  Location: [facility] ORS;  Service: Ophthalmology;  Laterality: Right;  right   YAG LASER APPLICATION Left 05/24/2015   Procedure: YAG LASER APPLICATION;  Surgeon: Jethro Bolus, MD;  Location: [facility] ORS;  Service: Ophthalmology;  Laterality: Left;    Family History  Problem Relation Age of Onset   Alzheimer's disease Mother    Dementia Mother    Hypertension Mother    Hyperlipidemia Mother    Parkinsonism Father     SOCIAL HISTORY: Social History   Socioeconomic History   Marital status: Married    Spouse name: Not on file   Number of children: 2   Years of education: Not on file   Highest education level: Not on file  Occupational History   Occupation: Retired    Associate Professor: RETIRED  Tobacco Use   Smoking status: Former    Current packs/day: 0.00    Average packs/day: 1 pack/day for 56.0 years (56.0 ttl pk-yrs)    Types: Cigarettes    Start date: 08/14/1950    Quit date: 08/14/2006    Years since quitting: 16.5    Passive exposure: Never   Smokeless tobacco: Never   Tobacco comments:    quit in 2008  Vaping Use   Vaping status: Never Used  Substance and Sexual Activity   Alcohol use: No    Alcohol/week: 0.0 standard drinks of alcohol   Drug use: No   Sexual activity: Never  Other Topics Concern   Not on file  Social History Narrative   Right hnaded   Social Determinants of Health   Financial Resource Strain: Not on file  Food Insecurity: No Food Insecurity (11/17/2022)   Hunger Vital Sign    Worried About Running Out of Food in the Last Year: Never true    Ran Out of Food in the Last Year: Never true  Transportation  Needs: No Transportation Needs (11/17/2022)   PRAPARE - Administrator, Civil Service (Medical): No    Lack of Transportation (Non-Medical): No  Physical Activity: Not on file  Stress: Not on file  Social Connections: Unknown (08/29/2022)   Received from [facility]   Social Network    Social Network: Not on file  Intimate Partner Violence: Not At Risk (11/17/2022)   Humiliation, Afraid, Rape, and Kick questionnaire    Fear of Current or Ex-Partner: No    Emotionally Abused: No    Physically Abused: No    Sexually Abused: No    Allergies  Allergen Reactions   Iodine Rash and Other (See Comments)    BETADINE Rash/burning, blisters on skin. Burns skin   Iohexol Rash and Other (See Comments)    Blisters; PT NEEDS 13-HOUR PREP  Sulfa Antibiotics Nausea And Vomiting   Petroleum Gauze Non-Woven 3x9" [Wound Dressings] Other (See Comments)    BURNING SENSATION, RED SKIN   Povidone-Iodine Other (See Comments)    Burns skin   Wound Dressing Adhesive Other (See Comments)    Burns skin   Tape Itching, Rash and Other (See Comments)    "DO NOT USE ADHESIVE TAPE" Not even a Band Aid" NO PAPER TAPE Burns skin Skin is very very thin    Current Outpatient Medications  Medication Sig Dispense Refill   albuterol (VENTOLIN HFA) 108 (90 Base) MCG/ACT inhaler Inhale 2 puffs into the lungs every 6 (six) hours as needed for wheezing or shortness of breath. 18 g 1   AMBULATORY NON FORMULARY MEDICATION Dispense one Readi Steadi glove 1 Units 0   amiodarone (PACERONE) 200 MG tablet Take 0.5 tablets (100 mg total) by mouth every other day.     apixaban (ELIQUIS) 2.5 MG TABS tablet Take 1 tablet (2.5 mg total) by mouth 2 (two) times daily. 180 tablet 1   ascorbic acid (VITAMIN C) 500 MG tablet Take 500 mg by mouth in the morning.     CALCIUM PO Take 1,200 mg by mouth 2 (two) times daily.     Camphor-Menthol-Methyl Sal (SALONPAS) 3.04-14-08 % PTCH Place 1 patch onto the  skin daily as needed (pain.).     Cholecalciferol (VITAMIN D-3) 25 MCG (1000 UT) CAPS Take 1,000-2,000 tablets by mouth See admin instructions. Take 1000 units tablet by mouth in the morning & take 54270 units tablets by mouth at night.     clopidogrel (PLAVIX) 75 MG tablet TAKE 1 TABLET EVERY MORNING 90 tablet 2   denosumab (PROLIA) 60 MG/ML SOSY injection Inject 60 mg into the skin every 6 (six) months.     Ferrous Sulfate (IRON PO) Take 65 mg by mouth at bedtime.     furosemide (LASIX) 20 MG tablet Take 2 tablets (40 mg total) by mouth daily. 120 tablet 3   MAGNESIUM OXIDE PO Take 500 mg by mouth every morning.     metoprolol tartrate (LOPRESSOR) 25 MG tablet Take 3 tablets (75 mg total) by mouth 2 (two) times daily. 540 tablet 2   Multiple Vitamin (MULTIVITAMIN WITH MINERALS) TABS tablet Take 1 tablet by mouth every morning.     pantoprazole (PROTONIX) 40 MG tablet Take 1 tablet (40 mg total) by mouth daily. 90 tablet 2   potassium chloride (KLOR-CON M) 10 MEQ tablet Take 2 tablets (20 mEq total) by mouth daily. 120 tablet 3   pravastatin (PRAVACHOL) 40 MG tablet TAKE 1 TABLET DAILY 90 tablet 3   SANTYL 250 UNIT/GM ointment APPLY TO AFFECTED AREAS ONCE DAILY. 30 g 0   umeclidinium bromide (INCRUSE ELLIPTA) 62.5 MCG/ACT AEPB Inhale 1 puff into the lungs daily. 90 each 3   No current facility-administered medications for this visit.    REVIEW OF SYSTEMS:  [X]  denotes positive finding, [ ]  denotes negative finding Cardiac  Comments:  Chest pain or chest pressure:    Shortness of breath upon exertion:    Short of breath when lying flat:    Irregular heart rhythm:        Vascular    Pain in calf, thigh, or hip brought on by ambulation:    Pain in feet at night that wakes you up from your sleep:     Blood clot in your veins:    Leg swelling:         Pulmonary  Oxygen at home:    Productive cough:     Wheezing:         Neurologic    Sudden weakness in arms or legs:     Sudden  numbness in arms or legs:     Sudden onset of difficulty speaking or slurred speech:    Temporary loss of vision in one eye:     Problems with dizziness:         Gastrointestinal    Blood in stool:     Vomited blood:         Genitourinary    Burning when urinating:     Blood in urine:        Psychiatric    Major depression:         Hematologic    Bleeding problems:    Problems with blood clotting too easily:        Skin    Rashes or ulcers:        Constitutional    Fever or chills:      PHYSICAL EXAM: There were no vitals filed for this visit.  GENERAL: The patient is a well-nourished female, in no acute distress. The vital signs are documented above. CARDIAC: There is a regular rate and rhythm.  VASCULAR:  Palpable femoral pulses bilaterally No palpable pedal pulses Right ankle ulcer as pictured PULMONARY: No respiratory distress. ABDOMEN: Soft and non-tender.  MUSCULOSKELETAL: There are no major deformities or cyanosis. NEUROLOGIC: No focal weakness or paresthesias are detected. PSYCHIATRIC: The patient has a normal affect.    DATA:   Right leg arterial duplex shows moderate 50 to 70% stenosis in the mid and distal SFA.  ABIs on the right are 0.87 with blunted monophasic abnormal waveform.  Toe tracings are flat.   Assessment/Plan:  82 y.o. female, with history of atrial fibrillation, hypertension, hyperlipidemia that presents for follow-up of a worsening right ankle ulcer.  She has known SFA disease based on her previous angiogram and no intervention was performed as this was felt to be less than 50%.  She now has worsening of her right lower extremity wound.  Duplex today shows 50 to 74% stenosis of both the mid and distal right SFA.  She has monophasic waveforms at the ankle and I think her ABIs are falsely elevated with pretty flat toe tracings.  I have recommended aortogram with lower extremity arteriogram with focus on the right leg with likely SFA  intervention that could include angioplasty and/or stenting.  Will get scheduled at [facility] in the Cath Lab.  I discussed either myself or Dr. Myra Gianotti will perform pending who is first available.  Risks and benefits discussed.   Cephus Shelling, MD [facility] Office: [phone]

## 2023-02-12 NOTE — H&P (View-Only) (Signed)
Patient name: [name] MRN: [id number] DOB: 1940-07-30 Sex: female  REASON FOR CONSULT: Non-healing right leg ulcer  HPI: [name] is a 82 y.o. female, with history of atrial fibrillation, hypertension, hyperlipidemia that presents for follow-up of a worsening right ankle ulcer.  She has had a wound to her right lower extremity for at least the last year.  This is followed by Dr. Allena Katz with podiatry.  She is putting Santyl on the wound.  This has gotten worse recently according to the husband who is doing most of the wound care.  She is well-known to our practice and previously followed by Dr. Myra Gianotti.  On 12/19/2021 she had a left SFA stent as well as a left popliteal DCB and angioplasty of the peroneal.  On 08/28/2022 she had a laser arthrectomy of the left popliteal and anterior tibial by Dr. Myra Gianotti.  As recently as 10/16/2022 she had laser atherectomy of her right anterior tibial for right foot ulcer with known SFA disease and no intervention was performed on her SFA disease.  Past Medical History:  Diagnosis Date   Anemia 05/2014.   Atrial fibrillation (HCC)    x 3 yrs   Breast cancer (HCC)    S/P left mastectomy and chemotherapy 1989 remained in remission   Carotid artery occlusion    Carotid bruit    LICA 60-79% (duplex 9/11)   Degenerative arthritis of spine 2015   Chronic back pain   Hyperlipidemia    Hypertension    Meningioma (HCC)    Osteoporosis    Pneumonia May 19, 2014   Stroke [facility])    CVA 2008   Subclavian steal syndrome    Ulcers of both lower extremities (HCC) 2015    Past Surgical History:  Procedure Laterality Date   ABDOMINAL AORTOGRAM W/LOWER EXTREMITY N/A 12/19/2021   Procedure: ABDOMINAL AORTOGRAM W/LOWER EXTREMITY;  Surgeon: Nada Libman, MD;  [facility];  Service: Cardiovascular;  Laterality: N/A;   ABDOMINAL AORTOGRAM W/LOWER EXTREMITY Bilateral 08/28/2022   Procedure: ABDOMINAL AORTOGRAM W/LOWER EXTREMITY;  Surgeon:  Nada Libman, MD;  [facility];  Service: Cardiovascular;  Laterality: Bilateral;   ABDOMINAL AORTOGRAM W/LOWER EXTREMITY N/A 10/16/2022   Procedure: ABDOMINAL AORTOGRAM W/LOWER EXTREMITY;  Surgeon: Nada Libman, MD;  [facility];  Service: Cardiovascular;  Laterality: N/A;   AMPUTATION TOE Left 12/29/2021   Procedure: Incisional DEBRIDEMENT Left Foot with Amputation Left Great Toe;  Surgeon: Candelaria Stagers, DPM;  Location: [facility] OR;  Service: Podiatry;  Laterality: Left;   CARDIAC CATHETERIZATION     CATARACT EXTRACTION Bilateral 2013   COLONOSCOPY  11/17/2010   Procedure: COLONOSCOPY;  Surgeon: Malissa Hippo, MD;  [facility];  Service: Endoscopy;  Laterality: N/A;  10:45 am   ESOPHAGOGASTRODUODENOSCOPY  11/17/2010   Procedure: ESOPHAGOGASTRODUODENOSCOPY (EGD);  Surgeon: Malissa Hippo, MD;  [facility];  Service: Endoscopy;  Laterality: N/A;   ESOPHAGOGASTRODUODENOSCOPY N/A 02/16/2015   Procedure: ESOPHAGOGASTRODUODENOSCOPY (EGD);  Surgeon: Ruffin Frederick, MD;  Location: [facility] ENDOSCOPY;  Service: Gastroenterology;  Laterality: N/A;   EXPLORATORY LAPAROTOMY  1960s   For peritonitis of undetermined cause   EYE SURGERY     LUMBAR EPIDURAL INJECTION  06-2012--06-2013   pt. states she has had 5 epidurals in 06-2012----06-2013   MASTECTOMY Left 1990   PERIPHERAL VASCULAR ATHERECTOMY  08/28/2022   Procedure: PERIPHERAL VASCULAR ATHERECTOMY;  Surgeon: Nada Libman, MD;  Location: [facility]  INVASIVE CV LAB;  Service: Cardiovascular;;  Lt Popliteal and AT   PERIPHERAL VASCULAR BALLOON ANGIOPLASTY  08/28/2022   Procedure: PERIPHERAL VASCULAR BALLOON ANGIOPLASTY;  Surgeon: Nada Libman, MD;  [facility];  Service: Cardiovascular;;  Lt Popliteal and AT   PERIPHERAL VASCULAR BALLOON ANGIOPLASTY  10/16/2022   Procedure: PERIPHERAL VASCULAR BALLOON ANGIOPLASTY;  Surgeon: Nada Libman, MD;  [facility];   Service: Cardiovascular;;  Rt AT   PERIPHERAL VASCULAR INTERVENTION Left 12/19/2021   Procedure: PERIPHERAL VASCULAR INTERVENTION;  Surgeon: Nada Libman, MD;  [facility];  Service: Cardiovascular;  Laterality: Left;  SFA and PTA of POP   YAG LASER APPLICATION Right 05/03/2015   Procedure: YAG LASER APPLICATION;  Surgeon: Jethro Bolus, MD;  Location: [facility];  Service: Ophthalmology;  Laterality: Right;  right   YAG LASER APPLICATION Left 05/24/2015   Procedure: YAG LASER APPLICATION;  Surgeon: Jethro Bolus, MD;  Location: [facility] ORS;  Service: Ophthalmology;  Laterality: Left;    Family History  Problem Relation Age of Onset   Alzheimer's disease Mother    Dementia Mother    Hypertension Mother    Hyperlipidemia Mother    Parkinsonism Father     SOCIAL HISTORY: Social History   Socioeconomic History   Marital status: Married    Spouse name: Not on file   Number of children: 2   Years of education: Not on file   Highest education level: Not on file  Occupational History   Occupation: Retired    Associate Professor: RETIRED  Tobacco Use   Smoking status: Former    Current packs/day: 0.00    Average packs/day: 1 pack/day for 56.0 years (56.0 ttl pk-yrs)    Types: Cigarettes    Start date: 08/14/1950    Quit date: 08/14/2006    Years since quitting: 16.5    Passive exposure: Never   Smokeless tobacco: Never   Tobacco comments:    quit in 2008  Vaping Use   Vaping status: Never Used  Substance and Sexual Activity   Alcohol use: No    Alcohol/week: 0.0 standard drinks of alcohol   Drug use: No   Sexual activity: Never  Other Topics Concern   Not on file  Social History Narrative   Right hnaded   Social Determinants of Health   Financial Resource Strain: Not on file  Food Insecurity: No Food Insecurity (11/17/2022)   Hunger Vital Sign    Worried About Running Out of Food in the Last Year: Never true    Ran Out of Food in the Last Year: Never true  Transportation  Needs: No Transportation Needs (11/17/2022)   PRAPARE - Administrator, Civil Service (Medical): No    Lack of Transportation (Non-Medical): No  Physical Activity: Not on file  Stress: Not on file  Social Connections: Unknown (08/29/2022)   [facility]   Social Network    Social Network: Not on file  Intimate Partner Violence: Not At Risk (11/17/2022)   Humiliation, Afraid, Rape, and Kick questionnaire    Fear of Current or Ex-Partner: No    Emotionally Abused: No    Physically Abused: No    Sexually Abused: No    Allergies  Allergen Reactions   Iodine Rash and Other (See Comments)    BETADINE Rash/burning, blisters on skin. Burns skin   Iohexol Rash and Other (See Comments)    Blisters; PT NEEDS 13-HOUR PREP  Sulfa Antibiotics Nausea And Vomiting   Petroleum Gauze Non-Woven 3x9" [Wound Dressings] Other (See Comments)    BURNING SENSATION, RED SKIN   Povidone-Iodine Other (See Comments)    Burns skin   Wound Dressing Adhesive Other (See Comments)    Burns skin   Tape Itching, Rash and Other (See Comments)    "DO NOT USE ADHESIVE TAPE" Not even a Band Aid" NO PAPER TAPE Burns skin Skin is very very thin    Current Outpatient Medications  Medication Sig Dispense Refill   albuterol (VENTOLIN HFA) 108 (90 Base) MCG/ACT inhaler Inhale 2 puffs into the lungs every 6 (six) hours as needed for wheezing or shortness of breath. 18 g 1   AMBULATORY NON FORMULARY MEDICATION Dispense one Readi Steadi glove 1 Units 0   amiodarone (PACERONE) 200 MG tablet Take 0.5 tablets (100 mg total) by mouth every other day.     apixaban (ELIQUIS) 2.5 MG TABS tablet Take 1 tablet (2.5 mg total) by mouth 2 (two) times daily. 180 tablet 1   ascorbic acid (VITAMIN C) 500 MG tablet Take 500 mg by mouth in the morning.     CALCIUM PO Take 1,200 mg by mouth 2 (two) times daily.     Camphor-Menthol-Methyl Sal (SALONPAS) 3.04-14-08 % PTCH Place 1 patch onto the  skin daily as needed (pain.).     Cholecalciferol (VITAMIN D-3) 25 MCG (1000 UT) CAPS Take 1,000-2,000 tablets by mouth See admin instructions. Take 1000 units tablet by mouth in the morning & take 16109 units tablets by mouth at night.     clopidogrel (PLAVIX) 75 MG tablet TAKE 1 TABLET EVERY MORNING 90 tablet 2   denosumab (PROLIA) 60 MG/ML SOSY injection Inject 60 mg into the skin every 6 (six) months.     Ferrous Sulfate (IRON PO) Take 65 mg by mouth at bedtime.     furosemide (LASIX) 20 MG tablet Take 2 tablets (40 mg total) by mouth daily. 120 tablet 3   MAGNESIUM OXIDE PO Take 500 mg by mouth every morning.     metoprolol tartrate (LOPRESSOR) 25 MG tablet Take 3 tablets (75 mg total) by mouth 2 (two) times daily. 540 tablet 2   Multiple Vitamin (MULTIVITAMIN WITH MINERALS) TABS tablet Take 1 tablet by mouth every morning.     pantoprazole (PROTONIX) 40 MG tablet Take 1 tablet (40 mg total) by mouth daily. 90 tablet 2   potassium chloride (KLOR-CON M) 10 MEQ tablet Take 2 tablets (20 mEq total) by mouth daily. 120 tablet 3   pravastatin (PRAVACHOL) 40 MG tablet TAKE 1 TABLET DAILY 90 tablet 3   SANTYL 250 UNIT/GM ointment APPLY TO AFFECTED AREAS ONCE DAILY. 30 g 0   umeclidinium bromide (INCRUSE ELLIPTA) 62.5 MCG/ACT AEPB Inhale 1 puff into the lungs daily. 90 each 3   No current facility-administered medications for this visit.    REVIEW OF SYSTEMS:  [X]  denotes positive finding, [ ]  denotes negative finding Cardiac  Comments:  Chest pain or chest pressure:    Shortness of breath upon exertion:    Short of breath when lying flat:    Irregular heart rhythm:        Vascular    Pain in calf, thigh, or hip brought on by ambulation:    Pain in feet at night that wakes you up from your sleep:     Blood clot in your veins:    Leg swelling:         Pulmonary  Oxygen at home:    Productive cough:     Wheezing:         Neurologic    Sudden weakness in arms or legs:     Sudden  numbness in arms or legs:     Sudden onset of difficulty speaking or slurred speech:    Temporary loss of vision in one eye:     Problems with dizziness:         Gastrointestinal    Blood in stool:     Vomited blood:         Genitourinary    Burning when urinating:     Blood in urine:        Psychiatric    Major depression:         Hematologic    Bleeding problems:    Problems with blood clotting too easily:        Skin    Rashes or ulcers:        Constitutional    Fever or chills:      PHYSICAL EXAM: There were no vitals filed for this visit.  GENERAL: The patient is a well-nourished female, in no acute distress. The vital signs are documented above. CARDIAC: There is a regular rate and rhythm.  VASCULAR:  Palpable femoral pulses bilaterally No palpable pedal pulses Right ankle ulcer as pictured PULMONARY: No respiratory distress. ABDOMEN: Soft and non-tender.  MUSCULOSKELETAL: There are no major deformities or cyanosis. NEUROLOGIC: No focal weakness or paresthesias are detected. PSYCHIATRIC: The patient has a normal affect.    DATA:   Right leg arterial duplex shows moderate 50 to 70% stenosis in the mid and distal SFA.  ABIs on the right are 0.87 with blunted monophasic abnormal waveform.  Toe tracings are flat.   Assessment/Plan:  82 y.o. female, with history of atrial fibrillation, hypertension, hyperlipidemia that presents for follow-up of a worsening right ankle ulcer.  She has known SFA disease based on her previous angiogram and no intervention was performed as this was felt to be less than 50%.  She now has worsening of her right lower extremity wound.  Duplex today shows 50 to 74% stenosis of both the mid and distal right SFA.  She has monophasic waveforms at the ankle and I think her ABIs are falsely elevated with pretty flat toe tracings.  I have recommended aortogram with lower extremity arteriogram with focus on the right leg with likely SFA  intervention that could include angioplasty and/or stenting.  Will get scheduled at [facility] in the Cath Lab.  I discussed either myself or Dr. Myra Gianotti will perform pending who is first available.  Risks and benefits discussed.   Cephus Shelling, MD [facility] [place] Office: [phone]

## 2023-02-15 ENCOUNTER — Other Ambulatory Visit: Payer: Self-pay

## 2023-02-15 DIAGNOSIS — L97519 Non-pressure chronic ulcer of other part of right foot with unspecified severity: Secondary | ICD-10-CM

## 2023-02-15 DIAGNOSIS — I739 Peripheral vascular disease, unspecified: Secondary | ICD-10-CM

## 2023-02-15 DIAGNOSIS — I7025 Atherosclerosis of native arteries of other extremities with ulceration: Secondary | ICD-10-CM

## 2023-02-18 ENCOUNTER — Ambulatory Visit: Payer: Medicare HMO

## 2023-02-18 ENCOUNTER — Encounter (HOSPITAL_COMMUNITY): Payer: Medicare HMO

## 2023-02-19 LAB — VAS US ABI WITH/WO TBI
Left ABI: 1.03
Right ABI: 0.87

## 2023-02-19 NOTE — Progress Notes (Signed)
Patient ID: [name], female   DOB: 1940/12/22, 82 y.o.   MRN: [id number]     82 y.o. 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 She has been asymptomatic and last saw him 05/01/21 LICA 50-69% duplex 03/16/21 GI bleed 02/20/15 transfused 4 units INR over 10 coumadin changed to eliquis. Had gastric and duodenal ulcers. Chronic lung disease with necrotizing pneumonia moderate COPD sees Ramaschawmy from pulmonary   Still issues with falling Using rolling wheel chair now  Feels her UE tremors are worse  Seen by Dr Tat 12/12/17 ? Decrease dose of amiodarone furtherOr change eliquis to pradaxa and try primidone Also noted left foot drop ? From lumbar radiculopathy  Told not to start Evinity for osteoporosis due to increased risk of MI/Stroke Dr Margo Aye suggested Getting Prolia now   Avapro d/c 05/31/20 for elevated K/Cr  1.23/54. Normalized on labs 10/24/20 Primary wanted to start Lisinopril for proteinuria but not a great idea given above and age   Hospitalized for sepsis 9/17-26/2023 left foot cellulitis with amputation of left great toe She sees Clark/Brabham  VVS stent to Left SFA to improve inflow with popliteal angioplasty  May 2024 had lser atherectomy of right anterior tibial for non healing right ulcer ABI 0.87 Due to have angiogram with ? Right SFA intervention   TTE 05/24/22 EF 40-45% mild/mod MR mild AR AV sclerosis nl RV  Hospitalized [facility] 06/18/22 with sepsis from pneumonia and volume overload with CHF and EF newly decreased 40-45% by TTE see above ECG with atypical flutter Diuresed and no attempts at [facility] On low dose eliquis due to age, weight and CRF Cr 1.43 BNP 2405  She continues to be very tenuous. BAck on amiodarone for flutter  Has f/u with Dr Arbutus Leas  Ardelia Mems to use primidone due to DOaC   Legs appear stable     ROS: Denies fever, malais, weight loss, blurry vision, decreased visual acuity, cough, sputum, SOB, hemoptysis,  pleuritic pain, palpitaitons, heartburn, abdominal pain, melena, lower extremity edema, claudication, or rash.  All other systems reviewed and negative   General: BP (!) 174/74   Pulse 67   Ht 5\' 3"  (1.6 m)   Wt 91 lb (41.3 kg)   SpO2 95%   BMI 16.12 kg/m  Affect appropriate Frail thin chronically ill white female  HEENT: normal Neck supple with no adenopathy JVP normal bilateral carotid and subclavian  bruits no thyromegaly Lungs clear with no wheezing and good diaphragmatic motion Heart:  S1/S2 no murmur, no rub, gallop or click PMI normal post left mastectomy  Abdomen: benighn, BS positve, no tenderness, no AAA no bruit.  No HSM or HJR Decreased pulse and BP in right arm  No edema Neuro UE tremors left foot drop  Skin bruising over arms  Post recent left great toe amputation non healing ulcer right lateral malleolus with chronic ischemic changes right foot      Current Outpatient Medications  Medication Sig Dispense Refill   albuterol (VENTOLIN HFA) 108 (90 Base) MCG/ACT inhaler Inhale 2 puffs into the lungs every 6 (six) hours as needed for wheezing or shortness of breath. 18 g 1   AMBULATORY NON FORMULARY MEDICATION Dispense one Readi Steadi glove 1 Units 0   amiodarone (PACERONE) 200 MG tablet Take 0.5 tablets (100 mg total) by mouth every other day. (Patient taking differently: Take 200 mg by mouth daily.)     apixaban (ELIQUIS) 2.5 MG TABS tablet Take 1  tablet (2.5 mg total) by mouth 2 (two) times daily. 180 tablet 1   ascorbic acid (VITAMIN C) 500 MG tablet Take 500 mg by mouth in the morning.     CALCIUM PO Take 1,200 mg by mouth 2 (two) times daily.     Camphor-Menthol-Methyl Sal (SALONPAS) 3.04-14-08 % PTCH Place 1 patch onto the skin daily as needed (pain.).     Cholecalciferol (VITAMIN D-3) 25 MCG (1000 UT) CAPS Take 1,000 Units by mouth 2 (two) times daily.     clopidogrel (PLAVIX) 75 MG tablet TAKE 1 TABLET EVERY MORNING 90 tablet 2   denosumab (PROLIA) 60 MG/ML  SOSY injection Inject 60 mg into the skin every 6 (six) months.     doxycycline (VIBRA-TABS) 100 MG tablet Take 1 tablet (100 mg total) by mouth 2 (two) times daily. 20 tablet 0   Ferrous Sulfate (IRON PO) Take 65 mg by mouth at bedtime.     furosemide (LASIX) 20 MG tablet Take 2 tablets (40 mg total) by mouth daily. (Patient taking differently: Take 20 mg by mouth 2 (two) times daily.) 120 tablet 3   MAGNESIUM OXIDE PO Take 500 mg by mouth every morning.     metoprolol tartrate (LOPRESSOR) 25 MG tablet Take 3 tablets (75 mg total) by mouth 2 (two) times daily. 540 tablet 2   Multiple Vitamin (MULTIVITAMIN WITH MINERALS) TABS tablet Take 1 tablet by mouth every morning.     pantoprazole (PROTONIX) 40 MG tablet Take 1 tablet (40 mg total) by mouth daily. 90 tablet 2   potassium chloride (KLOR-CON M) 10 MEQ tablet Take 2 tablets (20 mEq total) by mouth daily. 120 tablet 3   pravastatin (PRAVACHOL) 40 MG tablet TAKE 1 TABLET DAILY 90 tablet 3   SANTYL 250 UNIT/GM ointment APPLY TO AFFECTED AREAS ONCE DAILY. (Patient taking differently: Apply 1 Application topically 2 (two) times daily.) 30 g 0   umeclidinium bromide (INCRUSE ELLIPTA) 62.5 MCG/ACT AEPB Inhale 1 puff into the lungs daily. 90 each 3   No current facility-administered medications for this visit.    Allergies  Iodine, Iohexol, Sulfa antibiotics, Petroleum gauze non-woven 3x9" [wound dressings], Povidone-iodine, Wound dressing adhesive, and Tape  Electrocardiogram:  11/18/19 Afib rate 100 nonspecific ST changes   Assessment and Plan Vascular: right innominate occlusion with moderate LICA stenosis f/u duplex ordered  f/u Brabham 0/86/57 40-59% LICA stenosis   PAF:  Continue eliquis lower to 2.5 bid if any further bleeding issues She has had more recent refractory flutter She is not a candidate for Multaq due to CHF and decreased EF and CAD not a candidate for flecainide or propofonone. Renal failure precludes any reasonable dose of  Tikosyn On amiodarone  lower dose due to tremors   Chol:  On statin. mevacor increased last visit LDL 90 10/24/20    HTN:  stable off avapro   Pulmonary:  Quit smoking 12 years ago Gold Class 2 COPD f/u pulmonary   Edema:  No DVT by duplex norvasc decreased stable  Anemia :  History of gastric ulcers continue protonix f/u GI  Lab Results  Component Value Date   HCT 39.0 02/26/2023   CAD:  No chest pain extensive by CT last cath 2011 with no hemodynamically significant disease Continue plavix not on ASA due to GI bleed and need for eliquis for PAF  Tremor:  worse on amiodarone Sees Tat Neurology unable to use primidone due to eliquis   Vascular:  f/u Brabham left great toe amputation  with SFA stenting and Popliteal balloon. Non healing right ulcer for intervention to right SFA 11/19 with Dr Chestine Spore   DCM:  new onset decreased EF 40-45% by TTE 05/24/22 likely worse due to atypical flutter with limited no good options for AAT. Continue lopressor and current dose of lasix  See above regarding amiodarone    F/u with me in 6 months     Charlton Haws

## 2023-02-21 ENCOUNTER — Telehealth: Payer: Self-pay | Admitting: Podiatry

## 2023-02-21 MED ORDER — DOXYCYCLINE HYCLATE 100 MG PO TABS: 100.0000 mg | ORAL_TABLET | Freq: Two times a day (BID) | ORAL | 0 refills | Status: DC

## 2023-02-21 NOTE — Telephone Encounter (Signed)
Notified pts husband antibiotics were sent in. He said thank you

## 2023-02-21 NOTE — Telephone Encounter (Signed)
Pts husband called and said the home health nurse left message yesterday stating pt needed an antibiotic called in.   I did not see anything in chart. Pt uses [facility] in [place]. If you let me know I can call pts husband and let him know it was called in.

## 2023-02-26 ENCOUNTER — Encounter (HOSPITAL_COMMUNITY)
Admission: RE | Disposition: A | Discharge status: Home / Self Care | Payer: Self-pay | Source: Ambulatory Visit | Attending: Surgery

## 2023-02-26 ENCOUNTER — Other Ambulatory Visit: Payer: Self-pay

## 2023-02-26 ENCOUNTER — Ambulatory Visit (HOSPITAL_COMMUNITY)
Admission: RE | Admit: 2023-02-26 | Discharge: 2023-02-26 | Disposition: A | Discharge status: Home / Self Care | Payer: Medicare HMO | Source: Ambulatory Visit | Attending: Surgery | Admitting: Surgery

## 2023-02-26 DIAGNOSIS — Z87891 Personal history of nicotine dependence: Secondary | ICD-10-CM | POA: Diagnosis not present

## 2023-02-26 DIAGNOSIS — L97512 Non-pressure chronic ulcer of other part of right foot with fat layer exposed: Secondary | ICD-10-CM | POA: Insufficient documentation

## 2023-02-26 DIAGNOSIS — Z8673 Personal history of transient ischemic attack (TIA), and cerebral infarction without residual deficits: Secondary | ICD-10-CM | POA: Insufficient documentation

## 2023-02-26 DIAGNOSIS — R601 Generalized edema: Secondary | ICD-10-CM | POA: Diagnosis not present

## 2023-02-26 DIAGNOSIS — I70235 Atherosclerosis of native arteries of right leg with ulceration of other part of foot: Secondary | ICD-10-CM | POA: Diagnosis not present

## 2023-02-26 DIAGNOSIS — I70201 Unspecified atherosclerosis of native arteries of extremities, right leg: Secondary | ICD-10-CM | POA: Diagnosis not present

## 2023-02-26 DIAGNOSIS — Z9889 Other specified postprocedural states: Secondary | ICD-10-CM

## 2023-02-26 DIAGNOSIS — L97519 Non-pressure chronic ulcer of other part of right foot with unspecified severity: Secondary | ICD-10-CM | POA: Diagnosis not present

## 2023-02-26 DIAGNOSIS — I739 Peripheral vascular disease, unspecified: Secondary | ICD-10-CM

## 2023-02-26 HISTORY — PX: PERIPHERAL VASCULAR BALLOON ANGIOPLASTY: CATH118281

## 2023-02-26 HISTORY — PX: PERIPHERAL VASCULAR ATHERECTOMY: CATH118256

## 2023-02-26 HISTORY — PX: ABDOMINAL AORTOGRAM W/LOWER EXTREMITY: CATH118223

## 2023-02-26 LAB — POCT I-STAT, CHEM 8
BUN: 33 mg/dL — ABNORMAL HIGH (ref 8–23)
Calcium, Ion: 1.19 mmol/L (ref 1.15–1.40)
Chloride: 102 mmol/L (ref 98–111)
Creatinine, Ser: 1.7 mg/dL — ABNORMAL HIGH (ref 0.44–1.00)
Glucose, Bld: 96 mg/dL (ref 70–99)
HCT: 39 % (ref 36.0–46.0)
Hemoglobin: 13.3 g/dL (ref 12.0–15.0)
Potassium: 5.3 mmol/L — ABNORMAL HIGH (ref 3.5–5.1)
Sodium: 140 mmol/L (ref 135–145)
TCO2: 28 mmol/L (ref 22–32)

## 2023-02-26 LAB — POCT ACTIVATED CLOTTING TIME: Activated Clotting Time: 273 s

## 2023-02-26 SURGERY — ABDOMINAL AORTOGRAM W/LOWER EXTREMITY
Anesthesia: LOCAL

## 2023-02-26 MED ORDER — MIDAZOLAM HCL 2 MG/2ML IJ SOLN
INTRAMUSCULAR | Status: AC
  Filled 2023-02-26: qty 2

## 2023-02-26 MED ORDER — MIDAZOLAM HCL 2 MG/2ML IJ SOLN
INTRAMUSCULAR | Status: DC | PRN
  Administered 2023-02-26 (×2): 1 mg via INTRAVENOUS

## 2023-02-26 MED ORDER — HEPARIN SODIUM (PORCINE) 1000 UNIT/ML IJ SOLN
INTRAMUSCULAR | Status: DC | PRN
  Administered 2023-02-26: 4500 [IU] via INTRAVENOUS

## 2023-02-26 MED ORDER — SODIUM CHLORIDE 0.9% FLUSH: 3.0000 mL | Freq: Two times a day (BID) | INTRAVENOUS | Status: DC

## 2023-02-26 MED ORDER — SODIUM CHLORIDE 0.9 % WEIGHT BASED INFUSION: 1.0000 mL/kg/h | INTRAVENOUS | Status: DC

## 2023-02-26 MED ORDER — LABETALOL HCL 5 MG/ML IV SOLN: 10.0000 mg | INTRAVENOUS | Status: DC | PRN

## 2023-02-26 MED ORDER — IODIXANOL 320 MG/ML IV SOLN
INTRAVENOUS | Status: DC | PRN
  Administered 2023-02-26: 75 mL

## 2023-02-26 MED ORDER — HYDRALAZINE HCL 20 MG/ML IJ SOLN
INTRAMUSCULAR | Status: DC | PRN
  Administered 2023-02-26: 10 mg via INTRAVENOUS

## 2023-02-26 MED ORDER — ONDANSETRON HCL 4 MG/2ML IJ SOLN: 4.0000 mg | Freq: Four times a day (QID) | INTRAMUSCULAR | Status: DC | PRN

## 2023-02-26 MED ORDER — SODIUM CHLORIDE 0.9 % IV SOLN: 250.0000 mL | INTRAVENOUS | Status: DC | PRN

## 2023-02-26 MED ORDER — LIDOCAINE HCL (PF) 1 % IJ SOLN
INTRAMUSCULAR | Status: DC | PRN
  Administered 2023-02-26: 12 mL

## 2023-02-26 MED ORDER — HEPARIN SODIUM (PORCINE) 1000 UNIT/ML IJ SOLN
INTRAMUSCULAR | Status: AC
  Filled 2023-02-26: qty 10

## 2023-02-26 MED ORDER — SODIUM CHLORIDE 0.9 % IV SOLN: INTRAVENOUS | Status: DC

## 2023-02-26 MED ORDER — MORPHINE SULFATE (PF) 2 MG/ML IV SOLN: 2.0000 mg | INTRAVENOUS | Status: DC | PRN

## 2023-02-26 MED ORDER — NITROGLYCERIN 1 MG/10 ML FOR IR/CATH LAB
INTRA_ARTERIAL | Status: DC | PRN
  Administered 2023-02-26: 600 ug via INTRA_ARTERIAL

## 2023-02-26 MED ORDER — FAMOTIDINE IN NACL 20-0.9 MG/50ML-% IV SOLN
20.0000 mg | INTRAVENOUS | Status: AC
  Administered 2023-02-26: 20 mg via INTRAVENOUS
  Filled 2023-02-26: qty 50

## 2023-02-26 MED ORDER — OXYCODONE HCL 5 MG PO TABS: 5.0000 mg | ORAL_TABLET | ORAL | Status: DC | PRN

## 2023-02-26 MED ORDER — FENTANYL CITRATE (PF) 100 MCG/2ML IJ SOLN
INTRAMUSCULAR | Status: AC
  Filled 2023-02-26: qty 2

## 2023-02-26 MED ORDER — ACETAMINOPHEN 325 MG PO TABS
650.0000 mg | ORAL_TABLET | ORAL | Status: DC | PRN
Start: 2023-02-26 — End: 2023-02-26

## 2023-02-26 MED ORDER — SODIUM CHLORIDE 0.9% FLUSH: 3.0000 mL | INTRAVENOUS | Status: DC | PRN

## 2023-02-26 MED ORDER — CLOPIDOGREL BISULFATE 75 MG PO TABS: 75.0000 mg | ORAL_TABLET | Freq: Every day | ORAL | Status: DC

## 2023-02-26 MED ORDER — FENTANYL CITRATE (PF) 100 MCG/2ML IJ SOLN
INTRAMUSCULAR | Status: DC | PRN
  Administered 2023-02-26: 50 ug via INTRAVENOUS
  Administered 2023-02-26: 25 ug via INTRAVENOUS

## 2023-02-26 MED ORDER — LIDOCAINE HCL (PF) 1 % IJ SOLN
INTRAMUSCULAR | Status: AC
  Filled 2023-02-26: qty 30

## 2023-02-26 MED ORDER — HYDRALAZINE HCL 20 MG/ML IJ SOLN: 5.0000 mg | INTRAMUSCULAR | Status: DC | PRN

## 2023-02-26 MED ORDER — METHYLPREDNISOLONE SODIUM SUCC 125 MG IJ SOLR
125.0000 mg | INTRAMUSCULAR | Status: AC
  Administered 2023-02-26: 125 mg via INTRAVENOUS
  Filled 2023-02-26: qty 2

## 2023-02-26 MED ORDER — HEPARIN (PORCINE) IN NACL 1000-0.9 UT/500ML-% IV SOLN
INTRAVENOUS | Status: DC | PRN
  Administered 2023-02-26 (×2): 500 mL

## 2023-02-26 MED ORDER — DIPHENHYDRAMINE HCL 50 MG/ML IJ SOLN
25.0000 mg | INTRAMUSCULAR | Status: AC
  Administered 2023-02-26: 25 mg via INTRAVENOUS
  Filled 2023-02-26: qty 1

## 2023-02-26 MED ORDER — HYDRALAZINE HCL 20 MG/ML IJ SOLN
INTRAMUSCULAR | Status: AC
  Filled 2023-02-26: qty 1

## 2023-02-26 MED ORDER — NITROGLYCERIN 1 MG/10 ML FOR IR/CATH LAB
INTRA_ARTERIAL | Status: AC
  Filled 2023-02-26: qty 10

## 2023-02-26 SURGICAL SUPPLY — 26 items
BALLN STERLI SL OTW 2.5X80X150 (BALLOONS) ×2
BALLN STERLING OTW 2.5X150X150 (BALLOONS) ×2
BALLOON STRLNG OTW 2.5X150X150 (BALLOONS) IMPLANT
BALLOON STRLNG OTW 2.5X80X150 (BALLOONS) IMPLANT
CATH AURYON ATHREC 1.7 (CATHETERS) IMPLANT
CATH OMNI FLUSH 5F 65CM (CATHETERS) IMPLANT
CLOSURE MYNX CONTROL 6F/7F (Vascular Products) IMPLANT
COVER DOME SNAP 22 D (MISCELLANEOUS) IMPLANT
DCB RANGER 5.0X150 150 (BALLOONS) IMPLANT
DCB RANGER 5.0X40 135 (BALLOONS) IMPLANT
DEVICE TORQUE SEADRAGON GRN (MISCELLANEOUS) IMPLANT
GUIDEWIRE ANGLED .035X150CM (WIRE) IMPLANT
KIT ENCORE 26 ADVANTAGE (KITS) IMPLANT
KIT MICROPUNCTURE NIT STIFF (SHEATH) IMPLANT
KIT PV (KITS) ×2 IMPLANT
RANGER DCB 5.0X150 150 (BALLOONS) ×2
RANGER DCB 5.0X40 135 (BALLOONS) ×2
SET ATX-X65L (MISCELLANEOUS) IMPLANT
SHEATH CATAPULT 6FR 45 (SHEATH) IMPLANT
SHEATH PINNACLE 5F 10CM (SHEATH) IMPLANT
SHEATH PINNACLE 6F 10CM (SHEATH) IMPLANT
SHEATH PROBE COVER 6X72 (BAG) IMPLANT
TRANSDUCER W/STOPCOCK (MISCELLANEOUS) ×2 IMPLANT
TRAY PV CATH (CUSTOM PROCEDURE TRAY) ×2 IMPLANT
WIRE G V18X300CM (WIRE) IMPLANT
WIRE STARTER BENTSON 035X150 (WIRE) IMPLANT

## 2023-02-26 NOTE — Op Note (Signed)
Patient name: [name] MRN: [id number] DOB: 1940/05/27 Sex: female  02/26/2023 Pre-operative Diagnosis: Right foot ulcer Post-operative diagnosis:  Same Surgeon:  [name] Procedure Performed:  1.  Ultrasound-guided access, left femoral artery  2.  Abdominal aortogram  3.  Right lower extremity runoff  4.  Selective injection with catheter in right superficial femoral artery  5.  Laser atherectomy and drug-coated balloon angioplasty, right superficial femoral and popliteal artery  6.  Angioplasty, right anterior tibial artery  7.  Angioplasty, right peroneal artery  8.  Conscious sedation, 75 minutes  9.  Closure device, Mynx   Indications: This is an 82 year old female who was undergone multiple attempts at revascularization for limb salvage.  She has had a deterioration in the wound on her right leg and comes in today for angiographic evaluation  Procedure:  The patient was identified in the holding area and taken to room 8.  The patient was then placed supine on the table and prepped and draped in the usual sterile fashion.  A time out was called.  Conscious sedation was administered with the use of IV fentanyl and Versed under continuous physician and nurse monitoring.  Heart rate, blood pressure, and oxygen saturation were continuously monitored.  Total sedation time was 75 minutes.  Ultrasound was used to evaluate the left common femoral artery.  It was patent .  A digital ultrasound image was acquired.  A micropuncture needle was used to access the left common femoral artery under ultrasound guidance.  An 018 wire was advanced without resistance and a micropuncture sheath was placed.  The 018 wire was removed and a benson wire was placed.  The micropuncture sheath was exchanged for a 5 french sheath.  An omniflush catheter was advanced over the wire to the level of L-1.  An abdominal angiogram was obtained.  Next, using the omniflush catheter and a benson wire, the aortic  bifurcation was crossed and the catheter was placed into theright external iliac artery and right runoff was obtained.   Findings:   Aortogram: No significant renal artery stenosis was visualized.  The infrarenal abdominal aorta is widely patent.  Bilateral common and external iliac arteries are widely patent.  Bilateral femoral arteries are widely patent.  Right Lower Extremity: The right common femoral and profundofemoral artery are widely patent.  The superficial femoral artery is diffusely diseased with at least 50% stenosis at the adductor canal and greater than 50% stenosis behind the knee and the joint space.  The anterior tibial artery has a short segment subtotal occlusion.  There is stenosis within the peroneal artery greater than 70%.   Intervention: After the above images were acquired the decision was made to proceed with intervention.  A 6 French 45 cm sheath was advanced into the right superficial femoral artery.  The patient was fully heparinized additional contrast injections were performed with the catheter tip in the superficial femoral artery for better imaging.  There was calcified stenosis within the adductor canal that appeared to be hemodynamically significant as well as in the popliteal artery behind the joint space.  I really did not want to stent the popliteal artery behind the joint space and therefore felt that laser atherectomy was indicated to minimize the risk of dissection and subsequent stent placement.  I selected a 1.7 laser catheter and perform laser atherectomy of the superficial femoral and popliteal artery.  I used a 5 mm Ranger drug-coated balloon to treat the stenosis.  Completion imaging showed  no significant residual disease.  Attention was then turned towards the anterior tibial artery.  This was engaged with a V-18 wire.  I used a 2.5 x 150 Sterling balloon to treat the entire length of the anterior tibial artery.  A second inflation was performed with the  balloon into the popliteal artery to treat the ostial stenosis.  Completion imaging showed significant improvement with near resolution of the stenosis within the anterior tibial artery.  There was still disease at the ostium but I was reluctant to be more aggressive.  I did place a V-18 wire into the peroneal artery to protect the peroneal while I was treating the ostial lesion.  I then treated the peroneal artery using a 2.5 x 80 Sterling balloon.  Completion imaging showed resolution of the stenosis.  I was satisfied with these results.  The groin was closed with a Mynx  Impression:  #1  Multiple heavily calcified lesions within the right superficial femoral artery at the adductor canal as well as in the joint space behind the knee.  This was treated with laser atherectomy using a 1.7 laser and a 5 mm drug-coated Ranger balloon with resolution of the stenosis  #2  Subtotal occlusion of the right anterior tibial artery treated with a 2.5 mm balloon.  Ostial stenosis as well was treated with a 2.5 mm balloon.  There was residual stenosis at the ostium however I did not want to be more aggressive due to concerns over the risk of complication  #3  Greater than 50% tandem stenosis within the peroneal artery successfully treated with a 2.5 mm balloon    V. [name], M.D., [facility] [place] Office: [phone] Pager:  [phone]

## 2023-02-26 NOTE — Progress Notes (Signed)
Patient and husband was given discharge instructions. Both verbalized understanding. 

## 2023-02-27 ENCOUNTER — Encounter (HOSPITAL_COMMUNITY): Payer: Self-pay | Admitting: Surgery

## 2023-03-04 ENCOUNTER — Encounter: Payer: Self-pay | Admitting: Cardiovascular Disease

## 2023-03-04 ENCOUNTER — Ambulatory Visit: Payer: Medicare HMO | Attending: Cardiovascular Disease | Admitting: Cardiovascular Disease

## 2023-03-04 VITALS — BP 174/74 | HR 67 | Ht 63.0 in | Wt 91.0 lb

## 2023-03-04 DIAGNOSIS — I480 Paroxysmal atrial fibrillation: Secondary | ICD-10-CM | POA: Diagnosis not present

## 2023-03-04 DIAGNOSIS — I70223 Atherosclerosis of native arteries of extremities with rest pain, bilateral legs: Secondary | ICD-10-CM | POA: Diagnosis not present

## 2023-03-04 DIAGNOSIS — E782 Mixed hyperlipidemia: Secondary | ICD-10-CM

## 2023-03-04 DIAGNOSIS — Z7901 Long term (current) use of anticoagulants: Secondary | ICD-10-CM | POA: Diagnosis not present

## 2023-03-04 DIAGNOSIS — I2510 Atherosclerotic heart disease of native coronary artery without angina pectoris: Secondary | ICD-10-CM

## 2023-03-04 DIAGNOSIS — I10 Essential (primary) hypertension: Secondary | ICD-10-CM

## 2023-03-04 NOTE — Patient Instructions (Signed)
Medication Instructions:  Your physician recommends that you continue on your current medications as directed. Please refer to the Current Medication list given to you today.  *If you need a refill on your cardiac medications before your next appointment, please call your pharmacy*   Lab Work: NONE If you have labs (blood work) drawn today and your tests are completely normal, you will receive your results only by: MyChart Message (if you have MyChart) OR A paper copy in the mail If you have any lab test that is abnormal or we need to change your treatment, we will call you to review the results.   Testing/Procedures: NONE   Follow-Up: At [facility], you and your health needs are our priority.  As part of our continuing mission to provide you with exceptional heart care, we have created designated Provider Care Teams.  These Care Teams include your primary Cardiologist (physician) and Advanced Practice Providers (APPs -  Physician Assistants and Nurse Practitioners) who all work together to provide you with the care you need, when you need it.   Your next appointment:   6 month(s)  Provider:   Charlton Haws, MD

## 2023-03-06 NOTE — Interval H&P Note (Signed)
History and Physical Interval Note:  03/06/2023 6:19 PM  [name]  has presented today for surgery, with the diagnosis of nonhealing right foot wound.  The various methods of treatment have been discussed with the patient and family. After consideration of risks, benefits and other options for treatment, the patient has consented to  Procedure(s): ABDOMINAL AORTOGRAM W/LOWER EXTREMITY (N/A) PERIPHERAL VASCULAR ATHERECTOMY PERIPHERAL VASCULAR BALLOON ANGIOPLASTY as a surgical intervention.  The patient's history has been reviewed, patient examined, no change in status, stable for surgery.  I have reviewed the patient's chart and labs.  Questions were answered to the patient's satisfaction.     [name]

## 2023-03-06 NOTE — Interval H&P Note (Signed)
History and Physical Interval Note:  03/06/2023 6:17 PM  [name]  has presented today for surgery, with the diagnosis of nonhealing right foot wound.  The various methods of treatment have been discussed with the patient and family. After consideration of risks, benefits and other options for treatment, the patient has consented to  Procedure(s): ABDOMINAL AORTOGRAM W/LOWER EXTREMITY (N/A) PERIPHERAL VASCULAR ATHERECTOMY PERIPHERAL VASCULAR BALLOON ANGIOPLASTY as a surgical intervention.  The patient's history has been reviewed, patient examined, no change in status, stable for surgery.  I have reviewed the patient's chart and labs.  Questions were answered to the patient's satisfaction.     [name]

## 2023-03-06 NOTE — Interval H&P Note (Signed)
History and Physical Interval Note:  03/06/2023 6:16 PM  [name]  has presented today for surgery, with the diagnosis of nonhealing right foot wound.  The various methods of treatment have been discussed with the patient and family. After consideration of risks, benefits and other options for treatment, the patient has consented to  Procedure(s): ABDOMINAL AORTOGRAM W/LOWER EXTREMITY (N/A) PERIPHERAL VASCULAR ATHERECTOMY PERIPHERAL VASCULAR BALLOON ANGIOPLASTY as a surgical intervention.  The patient's history has been reviewed, patient examined, no change in status, stable for surgery.  I have reviewed the patient's chart and labs.  Questions were answered to the patient's satisfaction.     [name]

## 2023-03-13 ENCOUNTER — Inpatient Hospital Stay (HOSPITAL_COMMUNITY)
Admission: EM | Admit: 2023-03-13 | Discharge: 2023-03-20 | DRG: 854 | Disposition: A | Discharge status: Home Health | Payer: Medicare HMO | Attending: Internal Medicine | Admitting: Internal Medicine

## 2023-03-13 ENCOUNTER — Other Ambulatory Visit: Payer: Self-pay

## 2023-03-13 ENCOUNTER — Emergency Department (HOSPITAL_COMMUNITY): Payer: Medicare HMO

## 2023-03-13 ENCOUNTER — Inpatient Hospital Stay (HOSPITAL_COMMUNITY): Payer: Medicare HMO

## 2023-03-13 ENCOUNTER — Encounter (HOSPITAL_COMMUNITY): Payer: Self-pay | Admitting: Internal Medicine

## 2023-03-13 DIAGNOSIS — R636 Underweight: Secondary | ICD-10-CM | POA: Diagnosis present

## 2023-03-13 DIAGNOSIS — Z8701 Personal history of pneumonia (recurrent): Secondary | ICD-10-CM

## 2023-03-13 DIAGNOSIS — Z888 Allergy status to other drugs, medicaments and biological substances status: Secondary | ICD-10-CM

## 2023-03-13 DIAGNOSIS — I4821 Permanent atrial fibrillation: Secondary | ICD-10-CM | POA: Diagnosis present

## 2023-03-13 DIAGNOSIS — M810 Age-related osteoporosis without current pathological fracture: Secondary | ICD-10-CM | POA: Diagnosis present

## 2023-03-13 DIAGNOSIS — Z91041 Radiographic dye allergy status: Secondary | ICD-10-CM | POA: Diagnosis not present

## 2023-03-13 DIAGNOSIS — I739 Peripheral vascular disease, unspecified: Secondary | ICD-10-CM | POA: Diagnosis present

## 2023-03-13 DIAGNOSIS — L98492 Non-pressure chronic ulcer of skin of other sites with fat layer exposed: Secondary | ICD-10-CM | POA: Diagnosis not present

## 2023-03-13 DIAGNOSIS — K5900 Constipation, unspecified: Secondary | ICD-10-CM | POA: Diagnosis present

## 2023-03-13 DIAGNOSIS — Z681 Body mass index (BMI) 19 or less, adult: Secondary | ICD-10-CM

## 2023-03-13 DIAGNOSIS — A400 Sepsis due to streptococcus, group A: Principal | ICD-10-CM | POA: Diagnosis present

## 2023-03-13 DIAGNOSIS — Z87891 Personal history of nicotine dependence: Secondary | ICD-10-CM

## 2023-03-13 DIAGNOSIS — M86171 Other acute osteomyelitis, right ankle and foot: Secondary | ICD-10-CM | POA: Diagnosis present

## 2023-03-13 DIAGNOSIS — I130 Hypertensive heart and chronic kidney disease with heart failure and stage 1 through stage 4 chronic kidney disease, or unspecified chronic kidney disease: Secondary | ICD-10-CM | POA: Diagnosis present

## 2023-03-13 DIAGNOSIS — M79661 Pain in right lower leg: Secondary | ICD-10-CM | POA: Diagnosis not present

## 2023-03-13 DIAGNOSIS — Z22322 Carrier or suspected carrier of Methicillin resistant Staphylococcus aureus: Secondary | ICD-10-CM

## 2023-03-13 DIAGNOSIS — I5022 Chronic systolic (congestive) heart failure: Secondary | ICD-10-CM | POA: Diagnosis present

## 2023-03-13 DIAGNOSIS — Z9012 Acquired absence of left breast and nipple: Secondary | ICD-10-CM | POA: Diagnosis not present

## 2023-03-13 DIAGNOSIS — N179 Acute kidney failure, unspecified: Secondary | ICD-10-CM | POA: Diagnosis not present

## 2023-03-13 DIAGNOSIS — N184 Chronic kidney disease, stage 4 (severe): Secondary | ICD-10-CM | POA: Diagnosis present

## 2023-03-13 DIAGNOSIS — J449 Chronic obstructive pulmonary disease, unspecified: Secondary | ICD-10-CM | POA: Diagnosis present

## 2023-03-13 DIAGNOSIS — I2510 Atherosclerotic heart disease of native coronary artery without angina pectoris: Secondary | ICD-10-CM | POA: Diagnosis present

## 2023-03-13 DIAGNOSIS — Z7901 Long term (current) use of anticoagulants: Secondary | ICD-10-CM

## 2023-03-13 DIAGNOSIS — K2100 Gastro-esophageal reflux disease with esophagitis, without bleeding: Secondary | ICD-10-CM | POA: Diagnosis not present

## 2023-03-13 DIAGNOSIS — Z8249 Family history of ischemic heart disease and other diseases of the circulatory system: Secondary | ICD-10-CM

## 2023-03-13 DIAGNOSIS — Z820 Family history of epilepsy and other diseases of the nervous system: Secondary | ICD-10-CM

## 2023-03-13 DIAGNOSIS — Z853 Personal history of malignant neoplasm of breast: Secondary | ICD-10-CM

## 2023-03-13 DIAGNOSIS — A419 Sepsis, unspecified organism: Secondary | ICD-10-CM

## 2023-03-13 DIAGNOSIS — Z882 Allergy status to sulfonamides status: Secondary | ICD-10-CM

## 2023-03-13 DIAGNOSIS — E785 Hyperlipidemia, unspecified: Secondary | ICD-10-CM | POA: Diagnosis present

## 2023-03-13 DIAGNOSIS — E872 Acidosis, unspecified: Secondary | ICD-10-CM | POA: Diagnosis present

## 2023-03-13 DIAGNOSIS — M479 Spondylosis, unspecified: Secondary | ICD-10-CM | POA: Diagnosis present

## 2023-03-13 DIAGNOSIS — L089 Local infection of the skin and subcutaneous tissue, unspecified: Principal | ICD-10-CM

## 2023-03-13 DIAGNOSIS — L97312 Non-pressure chronic ulcer of right ankle with fat layer exposed: Principal | ICD-10-CM

## 2023-03-13 DIAGNOSIS — K219 Gastro-esophageal reflux disease without esophagitis: Secondary | ICD-10-CM | POA: Diagnosis present

## 2023-03-13 DIAGNOSIS — I4819 Other persistent atrial fibrillation: Secondary | ICD-10-CM | POA: Diagnosis not present

## 2023-03-13 DIAGNOSIS — T148XXA Other injury of unspecified body region, initial encounter: Secondary | ICD-10-CM | POA: Diagnosis not present

## 2023-03-13 DIAGNOSIS — M549 Dorsalgia, unspecified: Secondary | ICD-10-CM | POA: Diagnosis present

## 2023-03-13 DIAGNOSIS — Z79899 Other long term (current) drug therapy: Secondary | ICD-10-CM | POA: Diagnosis not present

## 2023-03-13 DIAGNOSIS — R54 Age-related physical debility: Secondary | ICD-10-CM | POA: Diagnosis present

## 2023-03-13 DIAGNOSIS — M79604 Pain in right leg: Secondary | ICD-10-CM | POA: Diagnosis present

## 2023-03-13 DIAGNOSIS — Z89422 Acquired absence of other left toe(s): Secondary | ICD-10-CM

## 2023-03-13 DIAGNOSIS — L03115 Cellulitis of right lower limb: Secondary | ICD-10-CM

## 2023-03-13 DIAGNOSIS — Z79620 Long term (current) use of immunosuppressive biologic: Secondary | ICD-10-CM

## 2023-03-13 DIAGNOSIS — Z9841 Cataract extraction status, right eye: Secondary | ICD-10-CM

## 2023-03-13 DIAGNOSIS — Z9221 Personal history of antineoplastic chemotherapy: Secondary | ICD-10-CM

## 2023-03-13 DIAGNOSIS — Z9842 Cataract extraction status, left eye: Secondary | ICD-10-CM

## 2023-03-13 DIAGNOSIS — Z86011 Personal history of benign neoplasm of the brain: Secondary | ICD-10-CM

## 2023-03-13 DIAGNOSIS — Z8673 Personal history of transient ischemic attack (TIA), and cerebral infarction without residual deficits: Secondary | ICD-10-CM

## 2023-03-13 DIAGNOSIS — G8929 Other chronic pain: Secondary | ICD-10-CM | POA: Diagnosis present

## 2023-03-13 DIAGNOSIS — Z8349 Family history of other endocrine, nutritional and metabolic diseases: Secondary | ICD-10-CM

## 2023-03-13 DIAGNOSIS — Z7902 Long term (current) use of antithrombotics/antiplatelets: Secondary | ICD-10-CM

## 2023-03-13 LAB — CBC WITH DIFFERENTIAL/PLATELET
Abs Immature Granulocytes: 0 10*3/uL (ref 0.00–0.07)
Basophils Absolute: 0.3 10*3/uL — ABNORMAL HIGH (ref 0.0–0.1)
Basophils Relative: 1 %
Eosinophils Absolute: 0 10*3/uL (ref 0.0–0.5)
Eosinophils Relative: 0 %
HCT: 34.8 % — ABNORMAL LOW (ref 36.0–46.0)
Hemoglobin: 11 g/dL — ABNORMAL LOW (ref 12.0–15.0)
Lymphocytes Relative: 4 %
Lymphs Abs: 1 10*3/uL (ref 0.7–4.0)
MCH: 31.7 pg (ref 26.0–34.0)
MCHC: 31.6 g/dL (ref 30.0–36.0)
MCV: 100.3 fL — ABNORMAL HIGH (ref 80.0–100.0)
Monocytes Absolute: 0.8 10*3/uL (ref 0.1–1.0)
Monocytes Relative: 3 %
Neutro Abs: 23.3 10*3/uL — ABNORMAL HIGH (ref 1.7–7.7)
Neutrophils Relative %: 92 %
Platelets: 338 10*3/uL (ref 150–400)
RBC: 3.47 MIL/uL — ABNORMAL LOW (ref 3.87–5.11)
RDW: 17.1 % — ABNORMAL HIGH (ref 11.5–15.5)
WBC: 25.3 10*3/uL — ABNORMAL HIGH (ref 4.0–10.5)
nRBC: 0 % (ref 0.0–0.2)
nRBC: 0 /100{WBCs}

## 2023-03-13 LAB — COMPREHENSIVE METABOLIC PANEL
ALT: 23 U/L (ref 0–44)
AST: 30 U/L (ref 15–41)
Albumin: 3.1 g/dL — ABNORMAL LOW (ref 3.5–5.0)
Alkaline Phosphatase: 63 U/L (ref 38–126)
Anion gap: 11 (ref 5–15)
BUN: 36 mg/dL — ABNORMAL HIGH (ref 8–23)
CO2: 22 mmol/L (ref 22–32)
Calcium: 8.9 mg/dL (ref 8.9–10.3)
Chloride: 104 mmol/L (ref 98–111)
Creatinine, Ser: 1.7 mg/dL — ABNORMAL HIGH (ref 0.44–1.00)
GFR, Estimated: 29 mL/min — ABNORMAL LOW (ref >60)
Glucose, Bld: 91 mg/dL (ref 70–99)
Potassium: 4.1 mmol/L (ref 3.5–5.1)
Sodium: 137 mmol/L (ref 135–145)
Total Bilirubin: 0.8 mg/dL (ref <1.2)
Total Protein: 7.2 g/dL (ref 6.5–8.1)

## 2023-03-13 LAB — MAGNESIUM: Magnesium: 2 mg/dL (ref 1.7–2.4)

## 2023-03-13 LAB — URINALYSIS, W/ REFLEX TO CULTURE (INFECTION SUSPECTED)
Bacteria, UA: NONE SEEN
Bilirubin Urine: NEGATIVE
Glucose, UA: NEGATIVE mg/dL
Hgb urine dipstick: NEGATIVE
Ketones, ur: NEGATIVE mg/dL
Leukocytes,Ua: NEGATIVE
Nitrite: NEGATIVE
Protein, ur: 30 mg/dL — AB
Specific Gravity, Urine: 1.012 (ref 1.005–1.030)
pH: 5 (ref 5.0–8.0)

## 2023-03-13 LAB — I-STAT CG4 LACTIC ACID, ED
Lactic Acid, Venous: 2.2 mmol/L (ref 0.5–1.9)
Lactic Acid, Venous: 1.8 mmol/L (ref 0.5–1.9)

## 2023-03-13 MED ORDER — HEPARIN SODIUM (PORCINE) 5000 UNIT/ML IJ SOLN
5000.0000 [IU] | Freq: Two times a day (BID) | INTRAMUSCULAR | Status: DC
  Administered 2023-03-13 – 2023-03-16 (×5): 5000 [IU] via SUBCUTANEOUS
  Filled 2023-03-13 (×5): qty 1

## 2023-03-13 MED ORDER — ACETAMINOPHEN 325 MG PO TABS: 650.0000 mg | ORAL_TABLET | Freq: Four times a day (QID) | ORAL | Status: DC | PRN

## 2023-03-13 MED ORDER — PRAVASTATIN SODIUM 40 MG PO TABS
40.0000 mg | ORAL_TABLET | Freq: Every day | ORAL | Status: DC
  Administered 2023-03-13 – 2023-03-20 (×8): 40 mg via ORAL
  Filled 2023-03-13 (×9): qty 1

## 2023-03-13 MED ORDER — UMECLIDINIUM BROMIDE 62.5 MCG/ACT IN AEPB
1.0000 | INHALATION_SPRAY | Freq: Every day | RESPIRATORY_TRACT | Status: DC
  Administered 2023-03-14 – 2023-03-20 (×7): 1 via RESPIRATORY_TRACT
  Filled 2023-03-13: qty 7

## 2023-03-13 MED ORDER — OXYCODONE HCL 5 MG PO TABS
5.0000 mg | ORAL_TABLET | Freq: Four times a day (QID) | ORAL | Status: DC | PRN
  Administered 2023-03-13 – 2023-03-20 (×6): 5 mg via ORAL
  Filled 2023-03-13 (×6): qty 1

## 2023-03-13 MED ORDER — AMIODARONE HCL 200 MG PO TABS
100.0000 mg | ORAL_TABLET | ORAL | Status: DC
  Administered 2023-03-15 – 2023-03-19 (×3): 100 mg via ORAL
  Filled 2023-03-13 (×3): qty 1

## 2023-03-13 MED ORDER — METOPROLOL TARTRATE 50 MG PO TABS
75.0000 mg | ORAL_TABLET | Freq: Two times a day (BID) | ORAL | Status: DC
  Administered 2023-03-13 – 2023-03-20 (×13): 75 mg via ORAL
  Filled 2023-03-13 (×13): qty 1

## 2023-03-13 MED ORDER — PANTOPRAZOLE SODIUM 40 MG PO TBEC
40.0000 mg | DELAYED_RELEASE_TABLET | Freq: Every day | ORAL | Status: DC
  Administered 2023-03-14 – 2023-03-20 (×7): 40 mg via ORAL
  Filled 2023-03-13 (×8): qty 1

## 2023-03-13 MED ORDER — SODIUM CHLORIDE 0.9 % IV SOLN: INTRAVENOUS | Status: AC

## 2023-03-13 MED ORDER — HYDROMORPHONE HCL 1 MG/ML IJ SOLN
0.5000 mg | INTRAMUSCULAR | Status: DC | PRN
  Administered 2023-03-16: 0.5 mg via INTRAVENOUS
  Filled 2023-03-13: qty 0.5

## 2023-03-13 MED ORDER — SODIUM CHLORIDE 0.9 % IV SOLN
2.0000 g | Freq: Once | INTRAVENOUS | Status: AC
  Administered 2023-03-13: 2 g via INTRAVENOUS
  Filled 2023-03-13: qty 20

## 2023-03-13 MED ORDER — ACETAMINOPHEN 650 MG RE SUPP: 650.0000 mg | Freq: Four times a day (QID) | RECTAL | Status: DC | PRN

## 2023-03-13 MED ORDER — HYDRALAZINE HCL 20 MG/ML IJ SOLN: 10.0000 mg | Freq: Four times a day (QID) | INTRAMUSCULAR | Status: DC | PRN

## 2023-03-13 MED ORDER — SODIUM CHLORIDE 0.9 % IV SOLN
2.0000 g | INTRAVENOUS | Status: DC
  Administered 2023-03-14 – 2023-03-17 (×4): 2 g via INTRAVENOUS
  Filled 2023-03-13 (×4): qty 20

## 2023-03-13 MED ORDER — VANCOMYCIN HCL 500 MG/100ML IV SOLN
500.0000 mg | INTRAVENOUS | Status: DC
  Administered 2023-03-15: 500 mg via INTRAVENOUS
  Filled 2023-03-13: qty 100

## 2023-03-13 MED ORDER — ALBUTEROL SULFATE (2.5 MG/3ML) 0.083% IN NEBU: 2.5000 mg | INHALATION_SOLUTION | Freq: Four times a day (QID) | RESPIRATORY_TRACT | Status: DC | PRN

## 2023-03-13 MED ORDER — VANCOMYCIN HCL IN DEXTROSE 1-5 GM/200ML-% IV SOLN
1000.0000 mg | Freq: Once | INTRAVENOUS | Status: AC
  Administered 2023-03-13: 1000 mg via INTRAVENOUS
  Filled 2023-03-13: qty 200

## 2023-03-13 NOTE — Progress Notes (Signed)
Pharmacy Antibiotic Note  [name] is a 82 y.o. female admitted on 03/13/2023 with cellulitis.  Pharmacy has been consulted for Ceftriaxone and Vancomycin dosing.  Patient received Ceftriaxone 2g IV x1 and Vancomycin 1000mg  IV x1 ~1300 in ED.  WBC 25.3, Tmax 99.7, LA 1.8 SCr 1.7 (CKD IV)  Plan: Ceftriaxone 2g IV q24h Vancomycin 500mg  IV q48h (eAUC ~474)    > Goal AUC 400-550    > Check vancomycin levels at steady state  Monitor daily CBC, temp, SCr, and for clinical signs of improvement  F/u cultures and de-escalate antibiotics as able      Temp (24hrs), Avg:99.3 F (37.4 C), Min:98.2 F (36.8 C), Max:100.6 F (38.1 C)  Recent Labs  Lab 03/13/23 1115 03/13/23 1145 03/13/23 1341  WBC 25.3*  --   --   CREATININE 1.70*  --   --   LATICACIDVEN  --  2.2* 1.8    Estimated Creatinine Clearance: 16.1 mL/min (A) (by C-G formula based on SCr of 1.7 mg/dL (H)).    Allergies  Allergen Reactions   Iodine Rash and Other (See Comments)    BETADINE, blistering rash/burns   Iohexol Rash and Other (See Comments)    Blisters; PT NEEDS 13-HOUR PREP    Petroleum Gauze Non-Woven 3x9" [Wound Dressings] Other (See Comments)    BURNING SENSATION, RED SKIN   Sulfa Antibiotics Nausea And Vomiting   Wound Dressing Adhesive Other (See Comments)    Burns skin   Tape Itching, Rash and Other (See Comments)    "DO NOT USE ADHESIVE TAPE" Not even a Band Aid" NO PAPER TAPE Burns skin Skin is very very thin    Antimicrobials this admission: Ceftriaxone 12/4 >>  Vancomycin 12/4 >>   Dose adjustments this admission: N/A  Microbiology results: 12/4 BCx: sent  Thank you for allowing pharmacy to be a part of this patient's care.  Wilburn Cornelia, PharmD, BCPS Clinical Pharmacist 03/13/2023 6:45 PM   Please refer to AMION for pharmacy phone number

## 2023-03-13 NOTE — ED Triage Notes (Addendum)
Pt. Stated, The Home Health Nurse came this morning and said I need to come have to look at the wound, maybe something came lose. I had a balloon in.  The wound needs to be checked and see how its circulating. Seems like when Im off the antibiotics it gets worse, Ive been off for 4 days now.

## 2023-03-13 NOTE — ED Notes (Signed)
Husbands Cell: [phone]

## 2023-03-13 NOTE — ED Notes (Signed)
Pt given turkey sandwich and drink 

## 2023-03-13 NOTE — ED Notes (Signed)
ED TO INPATIENT HANDOFF REPORT  ED Nurse Name and Phone #: Delice Bison, RN  S Name/Age/Gender [name] 82 y.o. female Room/Bed: 008C/008C  Code Status   Code Status: Full Code  Home/SNF/Other Home Patient oriented to: self, place, time, and situation Is this baseline? Yes   Triage Complete: Triage complete  Chief Complaint Sepsis [facility]) [A41.9]  Triage Note Pt. Stated, The Home Health Nurse came this morning and said I need to come have to look at the wound, maybe something came lose. I had a balloon in.  The wound needs to be checked and see how its circulating. Seems like when Im off the antibiotics it gets worse, Ive been off for 4 days now.   Allergies Allergies  Allergen Reactions   Iodine Rash and Other (See Comments)    BETADINE, blistering rash/burns   Iohexol Rash and Other (See Comments)    Blisters; PT NEEDS 13-HOUR PREP    Petroleum Gauze Non-Woven 3x9" [Wound Dressings] Other (See Comments)    BURNING SENSATION, RED SKIN   Sulfa Antibiotics Nausea And Vomiting   Wound Dressing Adhesive Other (See Comments)    Burns skin   Tape Itching, Rash and Other (See Comments)    "DO NOT USE ADHESIVE TAPE" Not even a Band Aid" NO PAPER TAPE Burns skin Skin is very very thin    Level of Care/Admitting Diagnosis ED Disposition     ED Disposition  Admit   Condition  --   Comment  Hospital Area: [facility] [[id number]]  Level of Care: Telemetry Medical [104]  May admit patient to [facility] or [facility] if equivalent level of care is available:: No  Covid Evaluation: Asymptomatic - no recent exposure (last 10 days) testing not required  Diagnosis: Sepsis [facility]) [[id number]]  Admitting Physician: Lorin Glass [[id number]]  Attending Physician: Lorin Glass [[id number]]  Certification:: I certify this patient will need inpatient services for at least 2 midnights  Expected Medical Readiness: 03/16/2023          B Medical/Surgery History Past Medical  History:  Diagnosis Date   Anemia 05/2014.   Atrial fibrillation (HCC)    x 3 yrs   Breast cancer (HCC)    S/P left mastectomy and chemotherapy 1989 remained in remission   Carotid artery occlusion    Carotid bruit    LICA 60-79% (duplex 9/11)   Degenerative arthritis of spine 2015   Chronic back pain   Hyperlipidemia    Hypertension    Meningioma (HCC)    Osteoporosis    Pneumonia May 19, 2014   Stroke [facility])    CVA 2008   Subclavian steal syndrome    Ulcers of both lower extremities (HCC) 2015   Past Surgical History:  Procedure Laterality Date   ABDOMINAL AORTOGRAM W/LOWER EXTREMITY N/A 12/19/2021   Procedure: ABDOMINAL AORTOGRAM W/LOWER EXTREMITY;  Surgeon: Nada Libman, MD;  Location: [facility] INVASIVE CV LAB;  Service: Cardiovascular;  Laterality: N/A;   ABDOMINAL AORTOGRAM W/LOWER EXTREMITY Bilateral 08/28/2022   Procedure: ABDOMINAL AORTOGRAM W/LOWER EXTREMITY;  Surgeon: Nada Libman, MD;  Location: [facility] INVASIVE CV LAB;  Service: Cardiovascular;  Laterality: Bilateral;   ABDOMINAL AORTOGRAM W/LOWER EXTREMITY N/A 10/16/2022   Procedure: ABDOMINAL AORTOGRAM W/LOWER EXTREMITY;  Surgeon: Nada Libman, MD;  Location: [facility] INVASIVE CV LAB;  Service: Cardiovascular;  Laterality: N/A;   ABDOMINAL AORTOGRAM W/LOWER EXTREMITY N/A 02/26/2023   Procedure: ABDOMINAL AORTOGRAM W/LOWER EXTREMITY;  Surgeon: Nada Libman, MD;  Location: [facility] INVASIVE  CV LAB;  Service: Cardiovascular;  Laterality: N/A;   AMPUTATION TOE Left 12/29/2021   Procedure: Incisional DEBRIDEMENT Left Foot with Amputation Left Great Toe;  Surgeon: Candelaria Stagers, DPM;  Location: [facility] OR;  Service: Podiatry;  Laterality: Left;   CARDIAC CATHETERIZATION     CATARACT EXTRACTION Bilateral 2013   COLONOSCOPY  11/17/2010   Procedure: COLONOSCOPY;  Surgeon: Malissa Hippo, MD;  Location: [facility] ENDO SUITE;  Service: Endoscopy;  Laterality: N/A;  10:45 am   ESOPHAGOGASTRODUODENOSCOPY  11/17/2010   Procedure:  ESOPHAGOGASTRODUODENOSCOPY (EGD);  Surgeon: Malissa Hippo, MD;  Location: [facility] ENDO SUITE;  Service: Endoscopy;  Laterality: N/A;   ESOPHAGOGASTRODUODENOSCOPY N/A 02/16/2015   Procedure: ESOPHAGOGASTRODUODENOSCOPY (EGD);  Surgeon: Ruffin Frederick, MD;  Location: [facility] ENDOSCOPY;  Service: Gastroenterology;  Laterality: N/A;   EXPLORATORY LAPAROTOMY  1960s   For peritonitis of undetermined cause   EYE SURGERY     LUMBAR EPIDURAL INJECTION  06-2012--06-2013   pt. states she has had 5 epidurals in 06-2012----06-2013   MASTECTOMY Left 1990   PERIPHERAL VASCULAR ATHERECTOMY  08/28/2022   Procedure: PERIPHERAL VASCULAR ATHERECTOMY;  Surgeon: Nada Libman, MD;  [facility]VAS[facility] CV LAB;  Service: Cardiovascular;;  Lt Popliteal and AT   PERIPHERAL VASCULAR ATHERECTOMY  02/26/2023   Procedure: PERIPHERAL VASCULAR ATHERECTOMY;  Surgeon: Nada Libman, MD;  [facility] INV[facility] CV LAB;  Service: Cardiovascular;;   PERIPHERAL VASCULAR BALLOON ANGIOPLASTY  08/28/2022   Procedure: PERIPHERAL VASCULAR BALLOON ANGIOPLASTY;  Surgeon: Nada Libman, MD;  [facility];  Service: Cardiovascular;;  Lt Popliteal and AT   PERIPHERAL VASCULAR BALLOON ANGIOPLASTY  10/16/2022   Procedure: PERIPHERAL VASCULAR BALLOON ANGIOPLASTY;  Surgeon: Nada Libman, MD;  [facility];  Service: Cardiovascular;;  Rt AT   PERIPHERAL VASCULAR BALLOON ANGIOPLASTY  02/26/2023   Procedure: PERIPHERAL VASCULAR BALLOON ANGIOPLASTY;  Surgeon: Nada Libman, MD;  [facility];  Service: Cardiovascular;;   PERIPHERAL VASCULAR INTERVENTION Left 12/19/2021   Procedure: PERIPHERAL VASCULAR INTERVENTION;  Surgeon: Nada Libman, MD;  [facility] CV LAB;  Service: Cardiovascular;  Laterality: Left;  SFA and PTA of POP   YAG LASER APPLICATION Right 05/03/2015   Procedure: YAG LASER APPLICATION;  Surgeon: Jethro Bolus, MD;  Location: [facility] ORS;  Service: Ophthalmology;   Laterality: Right;  right   YAG LASER APPLICATION Left 05/24/2015   Procedure: YAG LASER APPLICATION;  Surgeon: Jethro Bolus, MD;  Location: [facility] ORS;  Service: Ophthalmology;  Laterality: Left;     A IV Location/Drains/Wounds Patient Lines/Drains/Airways Status     Active Line/Drains/Airways     Name Placement date Placement time Site Days   Peripheral IV 03/13/23 20 G 1" Anterior;Right;Upper Arm 03/13/23  1119  Arm  less than 1   Wound / Incision (Open or Dehisced) 05/24/22 Other (Comment) Foot Left;Anterior 05/24/22  --  Foot  293   Wound / Incision (Open or Dehisced) 05/24/22 Venous stasis ulcer Foot Anterior;Left;Distal Healed pink area.  No drainage or open skin 05/24/22  1432  Foot  293   Wound / Incision (Open or Dehisced) 06/18/22 Ankle Left;Lateral 06/18/22  1219  Ankle  268            Intake/Output Last 24 hours  Intake/Output Summary (Last 24 hours) at 03/13/2023 1641 Last data filed at 03/13/2023 1341 Gross per 24 hour  Intake 100 ml  Output --  Net 100 ml    Labs/Imaging Results for orders  placed or performed during the hospital encounter of 03/13/23 (from the past 48 hour(s))  Comprehensive metabolic panel     Status: Abnormal   Collection Time: 03/13/23 11:15 AM  Result Value Ref Range   Sodium 137 135 - 145 mmol/L   Potassium 4.1 3.5 - 5.1 mmol/L   Chloride 104 98 - 111 mmol/L   CO2 22 22 - 32 mmol/L   Glucose, Bld 91 70 - 99 mg/dL    Comment: Glucose reference range applies only to samples taken after fasting for at least 8 hours.   BUN 36 (H) 8 - 23 mg/dL   Creatinine, Ser 0.98 (H) 0.44 - 1.00 mg/dL   Calcium 8.9 8.9 - 11.9 mg/dL   Total Protein 7.2 6.5 - 8.1 g/dL   Albumin 3.1 (L) 3.5 - 5.0 g/dL   AST 30 15 - 41 U/L   ALT 23 0 - 44 U/L   Alkaline Phosphatase 63 38 - 126 U/L   Total Bilirubin 0.8 <1.2 mg/dL   GFR, Estimated 29 (L) >60 mL/min    Comment: (NOTE) Calculated using the CKD-EPI Creatinine Equation (2021)    Anion gap 11 5 - 15     Comment: Performed at [facility], [address], [place], [place] [address]  CBC with Differential     Status: Abnormal   Collection Time: 03/13/23 11:15 AM  Result Value Ref Range   WBC 25.3 (H) 4.0 - 10.5 K/uL   RBC 3.47 (L) 3.87 - 5.11 MIL/uL   Hemoglobin 11.0 (L) 12.0 - 15.0 g/dL   HCT 95.6 (L) 21.3 - 08.6 %   MCV 100.3 (H) 80.0 - 100.0 fL   MCH 31.7 26.0 - 34.0 pg   MCHC 31.6 30.0 - 36.0 g/dL   RDW 57.8 (H) 46.9 - 62.9 %   Platelets 338 150 - 400 K/uL   nRBC 0.0 0.0 - 0.2 %   Neutrophils Relative % 92 %   Neutro Abs 23.3 (H) 1.7 - 7.7 K/uL   Lymphocytes Relative 4 %   Lymphs Abs 1.0 0.7 - 4.0 K/uL   Monocytes Relative 3 %   Monocytes Absolute 0.8 0.1 - 1.0 K/uL   Eosinophils Relative 0 %   Eosinophils Absolute 0.0 0.0 - 0.5 K/uL   Basophils Relative 1 %   Basophils Absolute 0.3 (H) 0.0 - 0.1 K/uL   nRBC 0 0 /100 WBC   Abs Immature Granulocytes 0.00 0.00 - 0.07 K/uL    Comment: Performed at [facility], [address] [place], [place] [address]  I-Stat Lactic Acid, ED     Status: Abnormal   Collection Time: 03/13/23 11:45 AM  Result Value Ref Range   Lactic Acid, Venous 2.2 (HH) 0.5 - 1.9 mmol/L   Comment NOTIFIED PHYSICIAN   I-Stat Lactic Acid, ED     Status: None   Collection Time: 03/13/23  1:41 PM  Result Value Ref Range   Lactic Acid, Venous 1.8 0.5 - 1.9 mmol/L  Urinalysis, w/ Reflex to Culture (Infection Suspected) -Urine, Clean Catch     Status: Abnormal   Collection Time: 03/13/23  3:11 PM  Result Value Ref Range   Specimen Source URINE, CLEAN CATCH    Color, Urine YELLOW YELLOW   APPearance CLEAR CLEAR   Specific Gravity, Urine 1.012 1.005 - 1.030   pH 5.0 5.0 - 8.0   Glucose, UA NEGATIVE NEGATIVE mg/dL   Hgb urine dipstick NEGATIVE NEGATIVE   Bilirubin Urine NEGATIVE NEGATIVE  Ketones, ur NEGATIVE NEGATIVE mg/dL   Protein, ur 30 (A) NEGATIVE mg/dL   Nitrite NEGATIVE NEGATIVE   Leukocytes,Ua NEGATIVE NEGATIVE   RBC / HPF 0-5 0 - 5  RBC/hpf   WBC, UA 0-5 0 - 5 WBC/hpf    Comment:        Reflex urine culture not performed if WBC <=10, OR if Squamous epithelial cells >5. If Squamous epithelial cells >5 suggest recollection.    Bacteria, UA NONE SEEN NONE SEEN   Squamous Epithelial / HPF 0-5 0 - 5 /HPF    Comment: Performed at [facility], [address] [place], [place] [address]   VAS [place] LOWER EXTREMITY VENOUS (DVT)  Result Date: 03/13/2023  Lower Venous DVT Study Patient Name:  [name]  Date of Exam:   03/13/2023 Medical Rec #: [id number]     Accession #:    [id number] Date of Birth: July 08, 1940     Patient Gender: F Patient Age:   49 years Exam Location:  [facility] Procedure:      VAS [place] LOWER EXTREMITY VENOUS (DVT) Referring Phys: DAN FLOYD --------------------------------------------------------------------------------  Indications: Pain, and ulceration.  Limitations: Open wound. Comparison Study: No prior exam. Performing Technologist: Fernande Bras  Examination Guidelines: A complete evaluation includes B-mode imaging, spectral Doppler, color Doppler, and power Doppler as needed of all accessible portions of each vessel. Bilateral testing is considered an integral part of a complete examination. Limited examinations for reoccurring indications may be performed as noted. The reflux portion of the exam is performed with the patient in reverse Trendelenburg.  +---------+---------------+---------+-----------+----------+--------------+ RIGHT    CompressibilityPhasicitySpontaneityPropertiesThrombus Aging +---------+---------------+---------+-----------+----------+--------------+ CFV      Full           Yes      Yes                                 +---------+---------------+---------+-----------+----------+--------------+ SFJ      Full                                                        +---------+---------------+---------+-----------+----------+--------------+ FV Prox  Full                                                         +---------+---------------+---------+-----------+----------+--------------+ FV Mid   Full                                                        +---------+---------------+---------+-----------+----------+--------------+ FV DistalFull                                                        +---------+---------------+---------+-----------+----------+--------------+ PFV      Full                                                        +---------+---------------+---------+-----------+----------+--------------+  POP      Full           Yes      Yes                                 +---------+---------------+---------+-----------+----------+--------------+ PTV      Full                                                        +---------+---------------+---------+-----------+----------+--------------+ PERO     Full                                                        +---------+---------------+---------+-----------+----------+--------------+ Edema noted in right calf. Enlarged lymph node noted in right groin measuing 2 x 1.1 x 1.6 cm, with vascularity.  +----+---------------+---------+-----------+----------+--------------+ LEFTCompressibilityPhasicitySpontaneityPropertiesThrombus Aging +----+---------------+---------+-----------+----------+--------------+ CFV Full           Yes      Yes                                 +----+---------------+---------+-----------+----------+--------------+    Summary: RIGHT: - There is no evidence of deep vein thrombosis in the lower extremity.  - No cystic structure found in the popliteal fossa.  LEFT: - No evidence of common femoral vein obstruction.   *See table(s) above for measurements and observations.    Preliminary     Pending Labs Unresulted Labs (From admission, onward)     Start     Ordered   03/14/23 0500  Basic metabolic panel  Tomorrow morning,   R        03/13/23  1446   03/14/23 0500  CBC  Tomorrow morning,   R        03/13/23 1446   03/13/23 1132  Blood culture (routine x 2)  BLOOD CULTURE X 2,   R (with STAT occurrences)      03/13/23 1131            Vitals/Pain Today's Vitals   03/13/23 1303 03/13/23 1330 03/13/23 1344 03/13/23 1600  BP:  121/70  118/70  Pulse:  81  (!) 53  Resp:    20  Temp: (!) 100.6 F (38.1 C)  98.8 F (37.1 C)   TempSrc: Oral  Oral   SpO2:  93%  100%  PainSc:        Isolation Precautions No active isolations  Medications Medications  acetaminophen (TYLENOL) tablet 650 mg (has no administration in time range)    Or  acetaminophen (TYLENOL) suppository 650 mg (has no administration in time range)  albuterol (PROVENTIL) (2.5 MG/3ML) 0.083% nebulizer solution 2.5 mg (has no administration in time range)  hydrALAZINE (APRESOLINE) injection 10 mg (has no administration in time range)  0.9 %  sodium chloride infusion ( Intravenous New Bag/Given 03/13/23 1452)  HYDROmorphone (DILAUDID) injection 0.5 mg (has no administration in time range)  vancomycin (VANCOCIN) IVPB 1000 mg/200 mL premix (0 mg Intravenous Stopped 03/13/23 1447)  cefTRIAXone (ROCEPHIN) 2 g in sodium chloride 0.9 % 100 mL IVPB (0 g  Intravenous Stopped 03/13/23 1341)    Mobility walks with device     Focused Assessments Cardiac Assessment Handoff:    Lab Results  Component Value Date   CKTOTAL 456 (H) 10/12/2009   CKMB (HH) 10/12/2009    6.6 CRITICAL VALUE NOTED.  VALUE IS CONSISTENT WITH PREVIOUSLY REPORTED AND CALLED VALUE.   TROPONINI <0.03 05/20/2014   Lab Results  Component Value Date   DDIMER 0.98 (H) 11/17/2022   Does the Patient currently have chest pain? No    R Recommendations: See Admitting Provider Note  Report given to:   Additional Notes:

## 2023-03-13 NOTE — ED Provider Notes (Signed)
EMERGENCY DEPARTMENT AT [facility] Provider Note   CSN: [id number] Arrival date & time: 03/13/23  1324     History  Chief Complaint  Patient presents with   Wound Check   Leg Pain    [name] is a 82 y.o. female.  82 yo F with a chief complaints of leg swelling.  The patient has had issues with her right lower extremity she has had wounds off and on.  Has had recent procedure to improve her blood supply to that foot.  She is seen by her home health aide today and there was concern for a DVT and she was sent here for evaluation.  She does take Eliquis and Plavix and denies any missed dosages of either medication.  She feels like the wounds have gotten worse on the right side.  No reported fevers.   Wound Check  Leg Pain      Home Medications Prior to Admission medications   Medication Sig Start Date End Date Taking? Authorizing Provider  albuterol (VENTOLIN HFA) 108 (90 Base) MCG/ACT inhaler Inhale 2 puffs into the lungs every 6 (six) hours as needed for wheezing or shortness of breath. 11/28/22   Kalman Shan, MD  AMBULATORY NON FORMULARY MEDICATION Dispense one Readi Steadi glove 09/25/22   Tat, Octaviano Batty, DO  amiodarone (PACERONE) 200 MG tablet Take 0.5 tablets (100 mg total) by mouth every other day. Patient taking differently: Take 200 mg by mouth daily. 10/22/22   Wendall Stade, MD  apixaban (ELIQUIS) 2.5 MG TABS tablet Take 1 tablet (2.5 mg total) by mouth 2 (two) times daily. 07/23/22   Wendall Stade, MD  ascorbic acid (VITAMIN C) 500 MG tablet Take 500 mg by mouth in the morning.    [provider]  CALCIUM PO Take 1,200 mg by mouth 2 (two) times daily.    [provider]  Camphor-Menthol-Methyl Sal (SALONPAS) 3.04-14-08 % PTCH Place 1 patch onto the skin daily as needed (pain.).    [provider]  cephALEXin (KEFLEX) 500 MG capsule Take 500 mg by mouth 2 (two) times daily. 03/12/23   [provider]   Cholecalciferol (VITAMIN D-3) 25 MCG (1000 UT) CAPS Take 1,000 Units by mouth 2 (two) times daily.    [provider]  clopidogrel (PLAVIX) 75 MG tablet TAKE 1 TABLET EVERY MORNING 10/30/22   Wendall Stade, MD  denosumab (PROLIA) 60 MG/ML SOSY injection Inject 60 mg into the skin every 6 (six) months.    Benita Stabile, MD  doxycycline (VIBRA-TABS) 100 MG tablet Take 1 tablet (100 mg total) by mouth 2 (two) times daily. 02/21/23   Candelaria Stagers, DPM  Ferrous Sulfate (IRON PO) Take 65 mg by mouth at bedtime.    [provider]  furosemide (LASIX) 20 MG tablet Take 2 tablets (40 mg total) by mouth daily. Patient taking differently: Take 20 mg by mouth 2 (two) times daily. 11/26/22   Swinyer, Zachary George, NP  MAGNESIUM OXIDE PO Take 500 mg by mouth every morning.    [provider]  metoprolol tartrate (LOPRESSOR) 25 MG tablet Take 3 tablets (75 mg total) by mouth 2 (two) times daily. 11/20/22   Wendall Stade, MD  Multiple Vitamin (MULTIVITAMIN WITH MINERALS) TABS tablet Take 1 tablet by mouth every morning.    [provider]  pantoprazole (PROTONIX) 40 MG tablet Take 1 tablet (40 mg total) by mouth daily. 10/12/22   Charlton Haws  C, MD  potassium chloride (KLOR-CON M) 10 MEQ tablet Take 2 tablets (20 mEq total) by mouth daily. 11/26/22   Swinyer, Zachary George, NP  pravastatin (PRAVACHOL) 40 MG tablet TAKE 1 TABLET DAILY 08/27/22   Wendall Stade, MD  SANTYL 250 UNIT/GM ointment APPLY TO AFFECTED AREAS ONCE DAILY. Patient taking differently: Apply 1 Application topically 2 (two) times daily. 03/30/22   Candelaria Stagers, DPM  umeclidinium bromide (INCRUSE ELLIPTA) 62.5 MCG/ACT AEPB Inhale 1 puff into the lungs daily. 06/25/22   Kalman Shan, MD      Allergies    Iodine, Iohexol, Sulfa antibiotics, Petroleum gauze non-woven 3x9" [wound dressings], Povidone-iodine, Wound dressing adhesive, and Tape    Review of Systems   Review of Systems  Physical Exam Updated  Vital Signs BP 121/70   Pulse 81   Temp 98.8 F (37.1 C) (Oral)   Resp 12   SpO2 93%  Physical Exam Vitals and nursing note reviewed.  Constitutional:      General: She is not in acute distress.    Appearance: She is well-developed. She is not diaphoretic.  HENT:     Head: Normocephalic and atraumatic.  Eyes:     Pupils: Pupils are equal, round, and reactive to light.  Cardiovascular:     Rate and Rhythm: Normal rate and regular rhythm.     Heart sounds: No murmur heard.    No friction rub. No gallop.  Pulmonary:     Effort: Pulmonary effort is normal.     Breath sounds: No wheezing or rales.  Abdominal:     General: There is no distension.     Palpations: Abdomen is soft.     Tenderness: There is no abdominal tenderness.  Musculoskeletal:        General: No tenderness.     Cervical back: Normal range of motion and neck supple.     Comments: The right lower extremity seems warm and well-perfused.  She has intact dorsalis pedis pulse.  The wounds are with some granulation tissue both on the medial and lateral aspect of the ankle.  The leg is quite swollen compared to the other side.  Warm to touch.  There is no obvious erythema.  Skin:    General: Skin is warm and dry.  Neurological:     Mental Status: She is alert and oriented to person, place, and time.  Psychiatric:        Behavior: Behavior normal.     ED Results / Procedures / Treatments   Labs (all labs ordered are listed, but only abnormal results are displayed) Labs Reviewed  COMPREHENSIVE METABOLIC PANEL - Abnormal; Notable for the following components:      Result Value   BUN 36 (*)    Creatinine, Ser 1.70 (*)    Albumin 3.1 (*)    GFR, Estimated 29 (*)    All other components within normal limits  CBC WITH DIFFERENTIAL/PLATELET - Abnormal; Notable for the following components:   WBC 25.3 (*)    RBC 3.47 (*)    Hemoglobin 11.0 (*)    HCT 34.8 (*)    MCV 100.3 (*)    RDW 17.1 (*)    Neutro Abs 23.3  (*)    Basophils Absolute 0.3 (*)    All other components within normal limits  I-STAT CG4 LACTIC ACID, ED - Abnormal; Notable for the following components:   Lactic Acid, Venous 2.2 (*)    All other components within normal limits  CULTURE, BLOOD (ROUTINE X 2)  CULTURE, BLOOD (ROUTINE X 2)  URINALYSIS, W/ REFLEX TO CULTURE (INFECTION SUSPECTED)  I-STAT CG4 LACTIC ACID, ED    EKG None  Radiology No results found.  Procedures Procedures    Medications Ordered in ED Medications  acetaminophen (TYLENOL) tablet 650 mg (has no administration in time range)    Or  acetaminophen (TYLENOL) suppository 650 mg (has no administration in time range)  albuterol (PROVENTIL) (2.5 MG/3ML) 0.083% nebulizer solution 2.5 mg (has no administration in time range)  hydrALAZINE (APRESOLINE) injection 10 mg (has no administration in time range)  0.9 %  sodium chloride infusion (has no administration in time range)  vancomycin (VANCOCIN) IVPB 1000 mg/200 mL premix (1,000 mg Intravenous New Bag/Given 03/13/23 1340)  cefTRIAXone (ROCEPHIN) 2 g in sodium chloride 0.9 % 100 mL IVPB (0 g Intravenous Stopped 03/13/23 1341)    ED Course/ Medical Decision Making/ A&P                                 Medical Decision Making Amount and/or Complexity of Data Reviewed Labs: ordered.  Risk Prescription drug management.   82 yo F with a chief complaints of right lower extremity swelling.  Her home health aide saw this morning was concerned for DVT and she was sent here for evaluation.  I think based on history this is much more likely to be worsening wound infection with surrounding cellulitis.  She has some chronic skin changes I think that make it hard to tell if it is erythematous or not.  It is quite warm compared to the other side.  She has a leukocytosis of 25,000.  Will start her on IV antibiotics.  Her creatinine is 1.7 which is the same as 2 weeks ago.  But seems to be slightly higher than what her  baseline was previously.  Patient's lactate is cleared.  She was febrile here.  Still has not yet had her venous ultrasound.  Will discuss with medicine for admission.  I discussed case with hospitalist.  Recommending podiatry involvement.  Discussed case with Dr. Annamary Rummage will see the patient here in the hospital.  The patients results and plan were reviewed and discussed.   Any x-rays performed were independently reviewed by myself.   Differential diagnosis were considered with the presenting HPI.  Medications  acetaminophen (TYLENOL) tablet 650 mg (has no administration in time range)    Or  acetaminophen (TYLENOL) suppository 650 mg (has no administration in time range)  albuterol (PROVENTIL) (2.5 MG/3ML) 0.083% nebulizer solution 2.5 mg (has no administration in time range)  hydrALAZINE (APRESOLINE) injection 10 mg (has no administration in time range)  0.9 %  sodium chloride infusion (has no administration in time range)  vancomycin (VANCOCIN) IVPB 1000 mg/200 mL premix (1,000 mg Intravenous New Bag/Given 03/13/23 1340)  cefTRIAXone (ROCEPHIN) 2 g in sodium chloride 0.9 % 100 mL IVPB (0 g Intravenous Stopped 03/13/23 1341)    Vitals:   03/13/23 1219 03/13/23 1303 03/13/23 1330 03/13/23 1344  BP: 123/77  121/70   Pulse: (!) 108  81   Resp: 12     Temp:  (!) 100.6 F (38.1 C)  98.8 F (37.1 C)  TempSrc:  Oral  Oral  SpO2: 95%  93%     Final diagnoses:  Wound infection  Cellulitis of right lower extremity    Admission/ observation were discussed with the admitting physician, patient and/or  family and they are comfortable with the plan.          Final Clinical Impression(s) / ED Diagnoses Final diagnoses:  Wound infection  Cellulitis of right lower extremity    Rx / DC Orders ED Discharge Orders     None         Melene Plan, DO 03/13/23 1446

## 2023-03-13 NOTE — Plan of Care (Signed)

## 2023-03-13 NOTE — H&P (Signed)
[facility] History and Physical  [name]:[id number] DOB: July 18, 1940 DOA: 03/13/2023 PCP: Benita Stabile, MD  Presented from: Home Chief Complaint: Worsening right ankle wound  History of Present Illness: [name] is a 82 y.o. female with PMH significant for HTN, HLD, stroke, PAD, A-fib on Eliquis, chronic anemia, arterial wounds of bilateral lower extremity, breast cancer s/p left mastectomy and chemo. Patient presented to the ED today with complaint of nonhealing wound of the right lower extremity.  Patient follows up with podiatrist Dr. Allena Katz.  She has been dealing with ankle wound for about a year, not responding to local wound care.  Last note from Dr. Allena Katz from 10/30 reviewed.  He recommended vascular evaluation.   11/5, patient was seen by vascular surgery Dr. Chestine Spore.  In the past, she had had angioplasty, stenting and atherectomy done by vascular surgery. 11/19, she had atherectomy, angioplasty of right lower extremity arteries. Following that, she was kept on antibiotics and was wound care at home and also being seen by home health nurse.  Recently completed the course of antibiotics. This morning, home with nurse saw the wound getting worse and hence sent her to the ED.  In the ED, patient had fever of 100.6, heart rate in 100s, blood pressure maintained, breathing on room air Labs with WC count 25.3, hemoglobin 11, platelet 338, lactic acid initially elevated 2.2 later down to 1.8 Sodium 137, BUN/creatinine 36/1.7 EKG with rate controlled A-fib at 67 bpm, QTc 529 ms Blood culture sent. Patient was empirically started on broad-spectrum IV antibiotics EDP discussed with podiatrist Dr. Annamary Rummage Hospitalist service was consulted for further evaluation and management  At the time of my evaluation, patient was lying on bed.  She has open right ankle ulcer on the lateral aspect and a wide area of cellulitis around it.  Right leg feels warm.  Alert, awake, oriented x  3.  Husband at bedside. Both of them feel frustrated with the progression of wound. History reviewed and detailed as above   Review of Systems:  All systems were reviewed and were negative unless otherwise mentioned in the HPI   Past medical history: Past Medical History:  Diagnosis Date   Anemia 05/2014.   Atrial fibrillation (HCC)    x 3 yrs   Breast cancer (HCC)    S/P left mastectomy and chemotherapy 1989 remained in remission   Carotid artery occlusion    Carotid bruit    LICA 60-79% (duplex 9/11)   Degenerative arthritis of spine 2015   Chronic back pain   Hyperlipidemia    Hypertension    Meningioma (HCC)    Osteoporosis    Pneumonia May 19, 2014   Stroke [facility])    CVA 2008   Subclavian steal syndrome    Ulcers of both lower extremities (HCC) 2015    Past surgical history: Past Surgical History:  Procedure Laterality Date   ABDOMINAL AORTOGRAM W/LOWER EXTREMITY N/A 12/19/2021   Procedure: ABDOMINAL AORTOGRAM W/LOWER EXTREMITY;  Surgeon: Nada Libman, MD;  [facility]: [facility];  Service: Cardiovascular;  Laterality: N/A;   ABDOMINAL AORTOGRAM W/LOWER EXTREMITY Bilateral 08/28/2022   Procedure: ABDOMINAL AORTOGRAM W/LOWER EXTREMITY;  Surgeon: Nada Libman, MD;  Location: [facility];  Service: Cardiovascular;  Laterality: Bilateral;   ABDOMINAL AORTOGRAM W/LOWER EXTREMITY N/A 10/16/2022   Procedure: ABDOMINAL AORTOGRAM W/LOWER EXTREMITY;  Surgeon: Nada Libman, MD;  Location: [facility];  Service: Cardiovascular;  Laterality: N/A;   ABDOMINAL  AORTOGRAM W/LOWER EXTREMITY N/A 02/26/2023   Procedure: ABDOMINAL AORTOGRAM W/LOWER EXTREMITY;  Surgeon: Nada Libman, MD;  Location: [facility];  Service: Cardiovascular;  Laterality: N/A;   AMPUTATION TOE Left 12/29/2021   Procedure: Incisional DEBRIDEMENT Left Foot with Amputation Left Great Toe;  Surgeon: Candelaria Stagers, DPM;  Location: [facility] OR;  Service: Podiatry;  Laterality:  Left;   CARDIAC CATHETERIZATION     CATARACT EXTRACTION Bilateral 2013   COLONOSCOPY  11/17/2010   Procedure: COLONOSCOPY;  Surgeon: Malissa Hippo, MD;  Location: [facility];  Service: Endoscopy;  Laterality: N/A;  10:45 am   ESOPHAGOGASTRODUODENOSCOPY  11/17/2010   Procedure: ESOPHAGOGASTRODUODENOSCOPY (EGD);  Surgeon: Malissa Hippo, MD;  Location: [facility];  Service: Endoscopy;  Laterality: N/A;   ESOPHAGOGASTRODUODENOSCOPY N/A 02/16/2015   Procedure: ESOPHAGOGASTRODUODENOSCOPY (EGD);  Surgeon: Ruffin Frederick, MD;  Location: [facility] ENDOSCOPY;  Service: Gastroenterology;  Laterality: N/A;   EXPLORATORY LAPAROTOMY  1960s   For peritonitis of undetermined cause   EYE SURGERY     LUMBAR EPIDURAL INJECTION  06-2012--06-2013   pt. states she has had 5 epidurals in 06-2012----06-2013   MASTECTOMY Left 1990   PERIPHERAL VASCULAR ATHERECTOMY  08/28/2022   Procedure: PERIPHERAL VASCULAR ATHERECTOMY;  Surgeon: Nada Libman, MD;  [facility];  Service: Cardiovascular;;  Lt Popliteal and AT   PERIPHERAL VASCULAR ATHERECTOMY  02/26/2023   Procedure: PERIPHERAL VASCULAR ATHERECTOMY;  Surgeon: Nada Libman, MD;  [facility];  Service: Cardiovascular;;   PERIPHERAL VASCULAR BALLOON ANGIOPLASTY  08/28/2022   Procedure: PERIPHERAL VASCULAR BALLOON ANGIOPLASTY;  Surgeon: Nada Libman, MD;  [facility];  Service: Cardiovascular;;  Lt Popliteal and AT   PERIPHERAL VASCULAR BALLOON ANGIOPLASTY  10/16/2022   Procedure: PERIPHERAL VASCULAR BALLOON ANGIOPLASTY;  Surgeon: Nada Libman, MD;  [facility];  Service: Cardiovascular;;  Rt AT   PERIPHERAL VASCULAR BALLOON ANGIOPLASTY  02/26/2023   Procedure: PERIPHERAL VASCULAR BALLOON ANGIOPLASTY;  Surgeon: Nada Libman, MD;  [facility];  Service: Cardiovascular;;   PERIPHERAL VASCULAR INTERVENTION Left 12/19/2021   Procedure: PERIPHERAL VASCULAR  INTERVENTION;  Surgeon: Nada Libman, MD;  [facility];  Service: Cardiovascular;  Laterality: Left;  SFA and PTA of POP   YAG LASER APPLICATION Right 05/03/2015   Procedure: YAG LASER APPLICATION;  Surgeon: Jethro Bolus, MD;  Location: [facility];  Service: Ophthalmology;  Laterality: Right;  right   YAG LASER APPLICATION Left 05/24/2015   Procedure: YAG LASER APPLICATION;  Surgeon: Jethro Bolus, MD;  Location: [facility];  Service: Ophthalmology;  Laterality: Left;    Social History:  reports that she quit smoking about 16 years ago. Her smoking use included cigarettes. She started smoking about 72 years ago. She has a 56 pack-year smoking history. She has never been exposed to tobacco smoke. She has never used smokeless tobacco. She reports that she does not drink alcohol and does not use drugs.  Allergies:  Allergies  Allergen Reactions   Iodine Rash and Other (See Comments)    BETADINE, blistering rash/burns   Iohexol Rash and Other (See Comments)    Blisters; PT NEEDS 13-HOUR PREP    Petroleum Gauze Non-Woven 3x9" [Wound Dressings] Other (See Comments)    BURNING SENSATION, RED SKIN   Sulfa Antibiotics Nausea And Vomiting   Wound Dressing Adhesive Other (See Comments)    Burns skin   Tape Itching, Rash and Other (  See Comments)    "DO NOT USE ADHESIVE TAPE" Not even a Band Aid" NO PAPER TAPE Burns skin Skin is very very thin   Iodine, Iohexol, Petroleum gauze non-woven 3x9" [wound dressings], Sulfa antibiotics, Wound dressing adhesive, and Tape   Family history:  Family History  Problem Relation Age of Onset   Alzheimer's disease Mother    Dementia Mother    Hypertension Mother    Hyperlipidemia Mother    Parkinsonism Father      Physical Exam: Vitals:   03/13/23 1303 03/13/23 1330 03/13/23 1344 03/13/23 1600  BP:  121/70  118/70  Pulse:  81  (!) 53  Resp:    20  Temp: (!) 100.6 F (38.1 C)  98.8 F (37.1 C)   TempSrc: Oral  Oral   SpO2:  93%  100%    Wt Readings from Last 3 Encounters:  03/04/23 41.3 kg  02/26/23 40.8 kg  02/12/23 41.8 kg   There is no height or weight on file to calculate BMI.  General exam: Pleasant, elderly Caucasian female.  Skin: No rashes, lesions or ulcers. HEENT: Atraumatic, normocephalic, no obvious bleeding Lungs: Clear to auscultation bilaterally CVS: Rate controlled A-fib, no murmur GI/Abd soft, nontender, nondistended, bowel sound present CNS: Alert, awake, oriented x 3 Psychiatry: Sad affect and frustrated because of nonhealing wound Extremities: Right ankle with nonhealing wound and surrounding cellulitis.  Right foot feels warm.   ------------------------------------------------------------------------------------------------------ Assessment/Plan: Principal Problem:   Sepsis (HCC) Active Problems:   Persistent atrial fibrillation (HCC)  Sepsis Infected right ankle arterial ulcer Right leg cellulitis Presented with nonhealing right ankle ulcer and in area of cellulitis around it Had fever of 100.6, WBC count elevated to 25.3, lactic acid initially was elevated to 2.2 Met sepsis criteria Started on IV antibiotics in the ED Blood pressure seems stable and platelets are already improved.  I would not bolus her fluid because of coexisting CHF. Started on maintenance IV fluid Blood culture sent. Started on IV Rocephin and IV vancomycin in the ED.  Continue the same.  Pharmacy consulted. ED with discussed with podiatrist Dr. Annamary Rummage.  He will see the patient this afternoon or tomorrow. Continue to monitor For pain control, I have started her on as needed IV Dilaudid and oral oxycodone Recent Labs  Lab 03/13/23 1115 03/13/23 1145 03/13/23 1341  WBC 25.3*  --   --   LATICACIDVEN  --  2.2* 1.8   Peripheral artery disease  s/p recent angioplasty and stenting of multiple right lower extremity arteries - Dr. Myra Gianotti PTA meds-Eliquis, Plavix Keep both on hold for now till decision of  surgery is made  Chronic systolic CHF Hypertension PTA meds- metoprolol, Lasix Continue metoprolol.  Keep Lasix on hold for now Most recent echo from February 2024 with EF 40 to 45%, global hypokinesis  Hyperlipidemia Pravastatin  A-fib  PTA meds- amiodarone, metoprolol Chronically anticoagulated with Eliquis.  Eliquis plan as above  Prolonged Qtc EKG on admission with QTc 529 ms Chronically on amiodarone.  Avoid other QTc prolonging medications Potassium level normal.  Obtain magnesium level Recent Labs  Lab 03/13/23 1115  K 4.1   CKD 4 Renal function is poor at baseline with most recent creatinine 1.7.  Currently remains at baseline. Recent Labs    07/10/22 0342 07/13/22 1523 07/30/22 0939 08/28/22 0552 10/16/22 1203 11/17/22 1809 11/18/22 0341 12/03/22 0803 02/26/23 1004 03/13/23 1115  BUN 34* 34* 27 35* 40* 32* 31* 32* 33* 36*  CREATININE 1.27* 1.49* 1.15* 1.30*  1.50* 1.32* 1.35* 1.46* 1.70* 1.70*   breast cancer  s/p left mastectomy and chemo  Mobility: Encourage ambulation  Goals of care   Code Status: Full Code    DVT prophylaxis: Heparin subcu while Eliquis on hold heparin injection 5,000 Units Start: 03/13/23 2200   Antimicrobials: Broad-spectrum IV antibiotic coverage with Rocephin and vancomycin Fluid: NS at 75 mL/h Consultants: Podiatry Family Communication: Husband at bedside  Dispo: The patient is from: Home              Anticipated d/c is to: Pending clinical course  Diet: Diet Order             Diet Heart Room service appropriate? Yes; Fluid consistency: Thin  Diet effective now                    ------------------------------------------------------------------------------------- Severity of Illness: The appropriate patient status for this patient is INPATIENT. Inpatient status is judged to be reasonable and necessary in order to provide the required intensity of service to ensure the patient's safety. The patient's  presenting symptoms, physical exam findings, and initial radiographic and laboratory data in the context of their chronic comorbidities is felt to place them at high risk for further clinical deterioration. Furthermore, it is not anticipated that the patient will be medically stable for discharge from the hospital within 2 midnights of admission.   * I certify that at the point of admission it is my clinical judgment that the patient will require inpatient hospital care spanning beyond 2 midnights from the point of admission due to high intensity of service, high risk for further deterioration and high frequency of surveillance required.* -------------------------------------------------------------------------------------  Home Meds: Prior to Admission medications   Medication Sig Start Date End Date Taking? Authorizing Provider  albuterol (VENTOLIN HFA) 108 (90 Base) MCG/ACT inhaler Inhale 2 puffs into the lungs every 6 (six) hours as needed for wheezing or shortness of breath. 11/28/22   Kalman Shan, MD  AMBULATORY NON FORMULARY MEDICATION Dispense one Readi Steadi glove 09/25/22   Tat, Octaviano Batty, DO  amiodarone (PACERONE) 200 MG tablet Take 0.5 tablets (100 mg total) by mouth every other day. Patient taking differently: Take 200 mg by mouth daily. 10/22/22   Wendall Stade, MD  apixaban (ELIQUIS) 2.5 MG TABS tablet Take 1 tablet (2.5 mg total) by mouth 2 (two) times daily. 07/23/22   Wendall Stade, MD  ascorbic acid (VITAMIN C) 500 MG tablet Take 500 mg by mouth in the morning.    [provider]  CALCIUM PO Take 1,200 mg by mouth 2 (two) times daily.    [provider]  Camphor-Menthol-Methyl Sal (SALONPAS) 3.04-14-08 % PTCH Place 1 patch onto the skin daily as needed (pain.).    [provider]  cephALEXin (KEFLEX) 500 MG capsule Take 500 mg by mouth 2 (two) times daily. 03/12/23   [provider]  Cholecalciferol (VITAMIN D-3) 25 MCG (1000 UT) CAPS Take  1,000 Units by mouth 2 (two) times daily.    [provider]  clopidogrel (PLAVIX) 75 MG tablet TAKE 1 TABLET EVERY MORNING 10/30/22   Wendall Stade, MD  denosumab (PROLIA) 60 MG/ML SOSY injection Inject 60 mg into the skin every 6 (six) months.    Benita Stabile, MD  doxycycline (VIBRA-TABS) 100 MG tablet Take 1 tablet (100 mg total) by mouth 2 (two) times daily. 02/21/23   Candelaria Stagers, DPM  Ferrous Sulfate (IRON PO) Take 65 mg by  mouth at bedtime.    [provider]  furosemide (LASIX) 20 MG tablet Take 2 tablets (40 mg total) by mouth daily. Patient taking differently: Take 20 mg by mouth 2 (two) times daily. 11/26/22   Swinyer, Zachary George, NP  MAGNESIUM OXIDE PO Take 500 mg by mouth every morning.    [provider]  metoprolol tartrate (LOPRESSOR) 25 MG tablet Take 3 tablets (75 mg total) by mouth 2 (two) times daily. 11/20/22   Wendall Stade, MD  Multiple Vitamin (MULTIVITAMIN WITH MINERALS) TABS tablet Take 1 tablet by mouth every morning.    [provider]  pantoprazole (PROTONIX) 40 MG tablet Take 1 tablet (40 mg total) by mouth daily. 10/12/22   Wendall Stade, MD  potassium chloride (KLOR-CON M) 10 MEQ tablet Take 2 tablets (20 mEq total) by mouth daily. 11/26/22   Swinyer, Zachary George, NP  pravastatin (PRAVACHOL) 40 MG tablet TAKE 1 TABLET DAILY 08/27/22   Wendall Stade, MD  SANTYL 250 UNIT/GM ointment APPLY TO AFFECTED AREAS ONCE DAILY. Patient taking differently: Apply 1 Application topically 2 (two) times daily. 03/30/22   Candelaria Stagers, DPM  umeclidinium bromide (INCRUSE ELLIPTA) 62.5 MCG/ACT AEPB Inhale 1 puff into the lungs daily. 06/25/22   Kalman Shan, MD    Labs on Admission:   CBC: Recent Labs  Lab 03/13/23 1115  WBC 25.3*  NEUTROABS 23.3*  HGB 11.0*  HCT 34.8*  MCV 100.3*  PLT 338    Basic Metabolic Panel: Recent Labs  Lab 03/13/23 1115  NA 137  K 4.1  CL 104  CO2 22  GLUCOSE 91  BUN 36*  CREATININE 1.70*   CALCIUM 8.9    Liver Function Tests: Recent Labs  Lab 03/13/23 1115  AST 30  ALT 23  ALKPHOS 63  BILITOT 0.8  PROT 7.2  ALBUMIN 3.1*   No results for input(s): "LIPASE", "AMYLASE" in the last 168 hours. No results for input(s): "AMMONIA" in the last 168 hours.  Cardiac Enzymes: No results for input(s): "CKTOTAL", "CKMB", "CKMBINDEX", "TROPONINI" in the last 168 hours.  BNP (last 3 results) Recent Labs    06/22/22 0742 07/09/22 1432 11/17/22 1809  BNP 2,405.0* 1,992.0* 2,137.0*    ProBNP (last 3 results) No results for input(s): "PROBNP" in the last 8760 hours.  CBG: No results for input(s): "GLUCAP" in the last 168 hours.  Lipase     Component Value Date/Time   LIPASE 36 12/25/2021 0000     Urinalysis    Component Value Date/Time   COLORURINE YELLOW 03/13/2023 1511   APPEARANCEUR CLEAR 03/13/2023 1511   LABSPEC 1.012 03/13/2023 1511   PHURINE 5.0 03/13/2023 1511   GLUCOSEU NEGATIVE 03/13/2023 1511   HGBUR NEGATIVE 03/13/2023 1511   BILIRUBINUR NEGATIVE 03/13/2023 1511   KETONESUR NEGATIVE 03/13/2023 1511   PROTEINUR 30 (A) 03/13/2023 1511   UROBILINOGEN 0.2 02/15/2015 0417   NITRITE NEGATIVE 03/13/2023 1511   LEUKOCYTESUR NEGATIVE 03/13/2023 1511     Drugs of Abuse  No results found for: "LABOPIA", "COCAINSCRNUR", "LABBENZ", "AMPHETMU", "THCU", "LABBARB"    Radiological Exams on Admission: VAS [place] LOWER EXTREMITY VENOUS (DVT)  Result Date: 03/13/2023  Lower Venous DVT Study Patient Name:  NATASH FARLESS  Date of Exam:   03/13/2023 Medical Rec #: [id number]     Accession #:    [id number] Date of Birth: 06-06-1940     Patient Gender: F Patient Age:   29 years Exam Location:  [facility] Procedure:  VAS [place] LOWER EXTREMITY VENOUS (DVT) Referring Phys: DAN FLOYD --------------------------------------------------------------------------------  Indications: Pain, and ulceration.  Limitations: Open wound. Comparison Study: No prior exam. Performing  Technologist: Fernande Bras  Examination Guidelines: A complete evaluation includes B-mode imaging, spectral Doppler, color Doppler, and power Doppler as needed of all accessible portions of each vessel. Bilateral testing is considered an integral part of a complete examination. Limited examinations for reoccurring indications may be performed as noted. The reflux portion of the exam is performed with the patient in reverse Trendelenburg.  +---------+---------------+---------+-----------+----------+--------------+ RIGHT    CompressibilityPhasicitySpontaneityPropertiesThrombus Aging +---------+---------------+---------+-----------+----------+--------------+ CFV      Full           Yes      Yes                                 +---------+---------------+---------+-----------+----------+--------------+ SFJ      Full                                                        +---------+---------------+---------+-----------+----------+--------------+ FV Prox  Full                                                        +---------+---------------+---------+-----------+----------+--------------+ FV Mid   Full                                                        +---------+---------------+---------+-----------+----------+--------------+ FV DistalFull                                                        +---------+---------------+---------+-----------+----------+--------------+ PFV      Full                                                        +---------+---------------+---------+-----------+----------+--------------+ POP      Full           Yes      Yes                                 +---------+---------------+---------+-----------+----------+--------------+ PTV      Full                                                        +---------+---------------+---------+-----------+----------+--------------+ PERO     Full                                                         +---------+---------------+---------+-----------+----------+--------------+  Edema noted in right calf. Enlarged lymph node noted in right groin measuing 2 x 1.1 x 1.6 cm, with vascularity.  +----+---------------+---------+-----------+----------+--------------+ LEFTCompressibilityPhasicitySpontaneityPropertiesThrombus Aging +----+---------------+---------+-----------+----------+--------------+ CFV Full           Yes      Yes                                 +----+---------------+---------+-----------+----------+--------------+    Summary: RIGHT: - There is no evidence of deep vein thrombosis in the lower extremity.  - No cystic structure found in the popliteal fossa.  LEFT: - No evidence of common femoral vein obstruction.   *See table(s) above for measurements and observations.    Preliminary      Signed, Lorin Glass, MD [facility] 03/13/2023

## 2023-03-13 NOTE — ED Notes (Signed)
Right leg wound bandaged and wrapped.

## 2023-03-14 ENCOUNTER — Inpatient Hospital Stay (HOSPITAL_COMMUNITY): Payer: Medicare HMO

## 2023-03-14 DIAGNOSIS — L97312 Non-pressure chronic ulcer of right ankle with fat layer exposed: Secondary | ICD-10-CM

## 2023-03-14 DIAGNOSIS — L089 Local infection of the skin and subcutaneous tissue, unspecified: Secondary | ICD-10-CM

## 2023-03-14 DIAGNOSIS — K2100 Gastro-esophageal reflux disease with esophagitis, without bleeding: Secondary | ICD-10-CM

## 2023-03-14 DIAGNOSIS — I4819 Other persistent atrial fibrillation: Secondary | ICD-10-CM

## 2023-03-14 DIAGNOSIS — T148XXA Other injury of unspecified body region, initial encounter: Secondary | ICD-10-CM

## 2023-03-14 DIAGNOSIS — L03115 Cellulitis of right lower limb: Secondary | ICD-10-CM

## 2023-03-14 LAB — BASIC METABOLIC PANEL
Anion gap: 11 (ref 5–15)
BUN: 33 mg/dL — ABNORMAL HIGH (ref 8–23)
CO2: 22 mmol/L (ref 22–32)
Calcium: 7.8 mg/dL — ABNORMAL LOW (ref 8.9–10.3)
Chloride: 101 mmol/L (ref 98–111)
Creatinine, Ser: 1.62 mg/dL — ABNORMAL HIGH (ref 0.44–1.00)
GFR, Estimated: 31 mL/min — ABNORMAL LOW (ref >60)
Glucose, Bld: 72 mg/dL (ref 70–99)
Potassium: 4 mmol/L (ref 3.5–5.1)
Sodium: 134 mmol/L — ABNORMAL LOW (ref 135–145)

## 2023-03-14 LAB — CBC
HCT: 27.3 % — ABNORMAL LOW (ref 36.0–46.0)
Hemoglobin: 8.8 g/dL — ABNORMAL LOW (ref 12.0–15.0)
MCH: 31.4 pg (ref 26.0–34.0)
MCHC: 32.2 g/dL (ref 30.0–36.0)
MCV: 97.5 fL (ref 80.0–100.0)
Platelets: 270 10*3/uL (ref 150–400)
RBC: 2.8 MIL/uL — ABNORMAL LOW (ref 3.87–5.11)
RDW: 17.4 % — ABNORMAL HIGH (ref 11.5–15.5)
WBC: 17.1 10*3/uL — ABNORMAL HIGH (ref 4.0–10.5)
nRBC: 0 % (ref 0.0–0.2)

## 2023-03-14 LAB — SURGICAL PCR SCREEN
MRSA, PCR: POSITIVE — AB
Staphylococcus aureus: POSITIVE — AB

## 2023-03-14 MED ORDER — MUPIROCIN 2 % EX OINT
1.0000 | TOPICAL_OINTMENT | Freq: Two times a day (BID) | CUTANEOUS | Status: AC
  Administered 2023-03-14 – 2023-03-19 (×10): 1 via NASAL
  Filled 2023-03-14 (×2): qty 22

## 2023-03-14 MED ORDER — CHLORHEXIDINE GLUCONATE CLOTH 2 % EX PADS
6.0000 | MEDICATED_PAD | Freq: Every day | CUTANEOUS | Status: AC
  Administered 2023-03-14 – 2023-03-19 (×5): 6 via TOPICAL

## 2023-03-14 MED ORDER — LORAZEPAM 2 MG/ML IJ SOLN
1.0000 mg | Freq: Once | INTRAMUSCULAR | Status: AC
  Administered 2023-03-14: 1 mg via INTRAVENOUS
  Filled 2023-03-14: qty 1

## 2023-03-14 NOTE — Consult Note (Signed)
PODIATRY CONSULTATION  [name] MRN [id number] DOB 07-06-1940 DOA 03/13/2023   Reason for consult:  Chief Complaint  Patient presents with   Wound Check   Leg Pain    Attending/Consulting physician: Rod Can MD  History of present illness: 82 y.o. female with PMH significant for HTN, HLD, stroke, PAD, A-fib on Eliquis, chronic anemia, arterial wounds of bilateral lower extremity, breast cancer s/p left mastectomy and chemo. Patient presented to the ED yesterday with complaint of nonhealing wound of the right lower extremity. Admitted due to cellulitis and wound infection right ankle.  She states she has been on an off abx for her right ankle wound which has been present for about a year. Not improving. When she comes off abx seems to get a worsening infection in right ankle. Has some pain on and off. Found to have temp 100.6, WBC 25.3. She is s/p RLE angiogram 11/19 with Dr. Myra Gianotti. At least 2 vessel runoff via AT and Peroneal arteries at coclusion of case.   Past Medical History:  Diagnosis Date   Anemia 05/2014.   Atrial fibrillation (HCC)    x 3 yrs   Breast cancer (HCC)    S/P left mastectomy and chemotherapy 1989 remained in remission   Carotid artery occlusion    Carotid bruit    LICA 60-79% (duplex 9/11)   Degenerative arthritis of spine 2015   Chronic back pain   Hyperlipidemia    Hypertension    Meningioma (HCC)    Osteoporosis    Pneumonia May 19, 2014   Stroke [facility])    CVA 2008   Subclavian steal syndrome    Ulcers of both lower extremities (HCC) 2015       Latest Ref Rng & Units 03/14/2023    4:58 AM 03/13/2023   11:15 AM 02/26/2023   10:04 AM  CBC  WBC 4.0 - 10.5 K/uL 17.1  25.3    Hemoglobin 12.0 - 15.0 g/dL 8.8  84.6  96.2   Hematocrit 36.0 - 46.0 % 27.3  34.8  39.0   Platelets 150 - 400 K/uL 270  338         Latest Ref Rng & Units 03/14/2023    4:58 AM 03/13/2023   11:15 AM 02/26/2023   10:04 AM  BMP  Glucose 70 - 99 mg/dL 72  91   96   BUN 8 - 23 mg/dL 33  36  33   Creatinine 0.44 - 1.00 mg/dL 9.52  8.41  3.24   Sodium 135 - 145 mmol/L 134  137  140   Potassium 3.5 - 5.1 mmol/L 4.0  4.1  5.3   Chloride 98 - 111 mmol/L 101  104  102   CO2 22 - 32 mmol/L 22  22    Calcium 8.9 - 10.3 mg/dL 7.8  8.9        Physical Exam: Lower Extremity Exam Vasc: R - PT 1/4 palpable, DP 1/4 palpable. Cap refill < 3 sec to digits  L - PT palpable, DP palpable. Cap refill <3 sec to digits  Derm: R - R ankle ulceration present lateral ankle to level of subcutaneous fat tissue but given depth over fibula appears like it could be to periosteum of distal fibula. No active drainage. Significant erythema. Also with medial ankle ucleration circular less than 1 cm.         L - Normal temp/texture/turgor with no open lesion or clinical signs of infection  MSK:  R -  No gross deformities. Compartments soft, non-tender, compressible  L - s/p prior L hallux amputation  Neuro: R - Gross sensation diminished. Gross motor function intact   L - Gross sensation diminished. Gross motor function intact    ASSESSMENT/PLAN OF CARE 82 y.o. female with PMHx significant for  HTN, HLD, stroke, PAD, A-fib on Eliquis, chronic anemia, arterial wounds of bilateral lower extremity, breast cancer s/p left mastectomy and chemo with sepsis 2/2 infected chronic right lateral ankle ulceration to the level of subcutaneous fat tissue with cellulitis of the ankle. Concern for possible OM of the distal fibula given depth and chronicty of wound.  XR - No acute osseous abnormality WBC improved today 17 from 25   - Recommend MRI of the right ankle to assess possibility of OM distal fibula.  - Plan for OR tomorrow AM as OR schedule allows. Plan for R ankle ulcer debridement, possible bone biopsy, graft application.  MRI to assist with surgical plan. Will obtain cultures intra op. NPO MN tonight.  - Likely vascularly optimized given recent RLE angio. - Continue IV  abx broad spectrum pending further culture data - Anticoagulation: Hold pending OR - Wound care: Leave dressing I placed today clean until OR - WB status: WBAT in RLE on post op shoe - Will continue to follow   Thank you for the consult.  Please contact me directly with any questions or concerns.           Corinna Gab, DPM [facility] / [facility]    [address] [place], [place] [address]                Office [phone]  Fax [phone]

## 2023-03-14 NOTE — Progress Notes (Signed)
Presents with nonhealing wound of the right lower extremity.    03/14/23 1543  TOC Brief Assessment  Insurance and Status Reviewed  Patient has primary care physician Yes  Home environment has been reviewed From home with husband.  Prior level of function: PTA  independent with ADL's. Own RW, rollator.  Prior/Current Home Services No current home services  Social Determinants of Health Reivew SDOH reviewed no interventions necessary  Readmission risk has been reviewed No  Transition of care needs no transition of care needs at this time   Plan: R ankle ulcer debridement, possible bone biopsy, graft application tomorrow.    TOC team following and will assist with needs. Gae Gallop RN,BSN,CM

## 2023-03-14 NOTE — Progress Notes (Signed)
[facility]                                                                              [name], is a 82 y.o. female, DOB - [birth year], [id number]:[id number] Admit date - 03/13/2023    Outpatient Primary MD for the patient is Margo Aye, Kathleene Hazel, MD  LOS - 1  days  Chief Complaint  Patient presents with   Wound Check   Leg Pain       Brief summary   Patient is a 82 year old female with HTN, HLD, stroke, A-fib on Eliquis, chronic anemia, PAD with arterial wounds of bilateral lower extremity, breast CA status post left mastectomy and chemotherapy presented to ED with nonhealing wound of the right lower extremity. Patient follows with podiatry, Dr. Allena Katz and has been dealing with the ankle wound for about a year. On 11/5, patient was seen by vascular surgery, Dr. Chestine Spore On 11/19, she had arthrectomy, angioplasty of the right lower extremity arteries.  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  She has completed the course of antibiotics. On the morning of admission, she was seen by the home health nurse and noted to have worsening wound and was sent to ED.  Patient noted to have open right ankle ulcer on the lateral aspect with wide area of cellulitis around it. In ED, temp 100.6 F, HR 100, BP stable, on room air. CBC showed WBC 25.3, hemoglobin 11, platelets 338, lactic acid 2.2.  Creatinine 1.7. Podiatry was consulted, admitted for further workup.   Assessment & Plan    Principal Problem:   Sepsis (HCC), POA secondary to infected right ankle arterial ulcer with cellulitis -Met sepsis criteria with fever, tachycardia, leukocytosis, lactic acidosis, source secondary to right ankle ulcer -Continue IV Rocephin, vancomycin -Follow blood cultures -Podiatry consulted, will obtain MRI of the right ankle -Per Dr. Annamary Rummage, plan for OR tomorrow a.m. 03/15/2023, n.p.o. after midnight -Continue pain control  Active problems Peripheral  artery disease  s/p recent angioplasty and stenting of multiple right lower extremity arteries - Dr. Myra Gianotti -Hold Eliquis, Plavix for OR in a.m. -Will resume once cleared by podiatry after surgery     Chronic systolic CHF, Hypertension -Continue metoprolol - Lasix currently on hold (n.p.o. after midnight) -Recent echo 05/2022 with EF of 40 to 45%, global hypokinesis    Hyperlipidemia Continue statin    Permanent atrial fibrillation -Rate controlled, continue metoprolol, amiodarone -Eliquis currently on hold due to surgery in a.m.    Prolonged Qtc EKG on admission with QTc 529 ms -Avoid QTc prolonging meds, -Maintain K ~ 4, Mg >2  Chronic kidney disease stage 4 - Renal function is poor at baseline with most recent creatinine 1.7. -Creatinine 1.6, at baseline   breast cancer  s/p left mastectomy and chemo   Underweight, Estimated body mass index is 16.12 kg/m as calculated from the following:   Height as of this encounter: 5\' 3"  (1.6 m).   Weight as of this encounter: 41.3 kg.  Code Status: Full code DVT Prophylaxis:  heparin injection 5,000 Units Start: 03/13/23 2200   Level of Care:  Level of care: Telemetry Medical Family Communication: Updated patient Disposition Plan:      Remains inpatient appropriate: OR tomorrow   Procedures:  None  Consultants:   Podiatry  Antimicrobials:   Anti-infectives (From admission, onward)    Start     Dose/Rate Route Frequency Ordered Stop   03/15/23 1300  vancomycin (VANCOREADY) IVPB 500 mg/100 mL        500 mg 100 mL/hr over 60 Minutes Intravenous Every 48 hours 03/13/23 1842     03/14/23 1300  cefTRIAXone (ROCEPHIN) 2 g in sodium chloride 0.9 % 100 mL IVPB        2 g 200 mL/hr over 30 Minutes Intravenous Every 24 hours 03/13/23 1839     03/13/23 1245  vancomycin (VANCOCIN) IVPB 1000 mg/200 mL premix        1,000 mg 200 mL/hr over 60 Minutes Intravenous  Once 03/13/23 1241 03/13/23 1447   03/13/23 1245  cefTRIAXone  (ROCEPHIN) 2 g in sodium chloride 0.9 % 100 mL IVPB        2 g 200 mL/hr over 30 Minutes Intravenous  Once 03/13/23 1241 03/13/23 1341          Medications  [START ON 03/15/2023] amiodarone  100 mg Oral QODAY   heparin injection (subcutaneous)  5,000 Units Subcutaneous Q12H   LORazepam  1 mg Intravenous Once   metoprolol tartrate  75 mg Oral BID   pantoprazole  40 mg Oral Daily   pravastatin  40 mg Oral Daily   umeclidinium bromide  1 puff Inhalation Daily      Subjective:   [name] was seen and examined today.  No acute complaints, waiting for surgery tomorrow.  No nausea vomiting, chest pain or shortness of breath.  Patient denies dizziness,  N/V/D/C, new weakness, numbess, tingling. No acute events overnight.    Objective:   Vitals:   03/13/23 2056 03/13/23 2057 03/14/23 0526 03/14/23 0806  BP: (!) 120/57 (!) 120/57 (!) 101/51 (!) 135/58  Pulse: (!) 108 (!) 108 60 (!) 56  Resp: 18 18 18 18   Temp: 100.1 F (37.8 C) 100.1 F (37.8 C) 99.2 F (37.3 C) 98.7 F (37.1 C)  TempSrc: Oral Oral Oral Oral  SpO2:   90% 94%  Weight:  41.3 kg    Height:  5\' 3"  (1.6 m)      Intake/Output Summary (Last 24 hours) at 03/14/2023 0957 Last data filed at 03/14/2023 0537 Gross per 24 hour  Intake 1104.22 ml  Output --  Net 1104.22 ml     Wt Readings from Last 3 Encounters:  03/13/23 41.3 kg  03/04/23 41.3 kg  02/26/23 40.8 kg     Exam General: Alert and oriented x 3, NAD Cardiovascular: Irregular Respiratory: Clear to auscultation bilaterally, no wheezing, rales or rhonchi Gastrointestinal: Soft, nontender, nondistended, + bowel sounds Ext: no pedal edema bilaterally Neuro: no new deficits Skin:  Psych: Normal affect     Data Reviewed:  I have personally reviewed following labs    CBC Lab Results  Component Value Date   WBC 17.1 (H) 03/14/2023   RBC 2.80 (L) 03/14/2023   HGB 8.8 (L) 03/14/2023   HCT 27.3 (L) 03/14/2023   MCV 97.5 03/14/2023   MCH 31.4  03/14/2023   PLT 270 03/14/2023   MCHC 32.2 03/14/2023   RDW 17.4 (H) 03/14/2023   LYMPHSABS 1.0 03/13/2023   MONOABS 0.8 03/13/2023   EOSABS 0.0 03/13/2023   BASOSABS 0.3 (H) 03/13/2023  Last metabolic panel Lab Results  Component Value Date   NA 134 (L) 03/14/2023   K 4.0 03/14/2023   CL 101 03/14/2023   CO2 22 03/14/2023   BUN 33 (H) 03/14/2023   CREATININE 1.62 (H) 03/14/2023   GLUCOSE 72 03/14/2023   GFRNONAA 31 (L) 03/14/2023   GFRAA >60 02/20/2015   CALCIUM 7.8 (L) 03/14/2023   PHOS 2.2 (L) 03/06/2022   PROT 7.2 03/13/2023   ALBUMIN 3.1 (L) 03/13/2023   BILITOT 0.8 03/13/2023   ALKPHOS 63 03/13/2023   AST 30 03/13/2023   ALT 23 03/13/2023   ANIONGAP 11 03/14/2023    CBG (last 3)  No results for input(s): "GLUCAP" in the last 72 hours.    Coagulation Profile: No results for input(s): "INR", "PROTIME" in the last 168 hours.   Radiology Studies: I have personally reviewed the imaging studies  DG Ankle Complete Right  Result Date: 03/13/2023 CLINICAL DATA:  Cellulitis. EXAM: RIGHT ANKLE - COMPLETE 3+ VIEW COMPARISON:  None Available. FINDINGS: There is no acute fracture or dislocation. The bones are osteopenic. The ankle mortise is intact. Skin wound over the lateral malleolus. Scattered soft tissue calcification likely sequela chronic venous stasis. IMPRESSION: 1. No acute fracture or dislocation. 2. Skin wound over the lateral malleolus. Electronically Signed   By: Elgie Collard M.D.   On: 03/13/2023 19:43   VAS [place] LOWER EXTREMITY VENOUS (DVT)  Result Date: 03/13/2023  Lower Venous DVT Study Patient Name:  [name]  Date of Exam:   03/13/2023 Medical Rec #: [id number]     Accession #:    [id number] Date of Birth: [birth year]     Patient Gender: F Patient Age:   1 years Exam Location:  [facility] Procedure:      VAS [place] LOWER EXTREMITY VENOUS (DVT) Referring Phys: DAN FLOYD  --------------------------------------------------------------------------------  Indications: Pain, and ulceration.  Limitations: Open wound. Comparison Study: No prior exam. Performing Technologist: Fernande Bras  Examination Guidelines: A complete evaluation includes B-mode imaging, spectral Doppler, color Doppler, and power Doppler as needed of all accessible portions of each vessel. Bilateral testing is considered an integral part of a complete examination. Limited examinations for reoccurring indications may be performed as noted. The reflux portion of the exam is performed with the patient in reverse Trendelenburg.  +---------+---------------+---------+-----------+----------+--------------+ RIGHT    CompressibilityPhasicitySpontaneityPropertiesThrombus Aging +---------+---------------+---------+-----------+----------+--------------+ CFV      Full           Yes      Yes                                 +---------+---------------+---------+-----------+----------+--------------+ SFJ      Full                                                        +---------+---------------+---------+-----------+----------+--------------+ FV Prox  Full                                                        +---------+---------------+---------+-----------+----------+--------------+ FV Mid   Full                                                        +---------+---------------+---------+-----------+----------+--------------+  FV DistalFull                                                        +---------+---------------+---------+-----------+----------+--------------+ PFV      Full                                                        +---------+---------------+---------+-----------+----------+--------------+ POP      Full           Yes      Yes                                 +---------+---------------+---------+-----------+----------+--------------+ PTV      Full                                                         +---------+---------------+---------+-----------+----------+--------------+ PERO     Full                                                        +---------+---------------+---------+-----------+----------+--------------+ Edema noted in right calf. Enlarged lymph node noted in right groin measuing 2 x 1.1 x 1.6 cm, with vascularity.  +----+---------------+---------+-----------+----------+--------------+ LEFTCompressibilityPhasicitySpontaneityPropertiesThrombus Aging +----+---------------+---------+-----------+----------+--------------+ CFV Full           Yes      Yes                                 +----+---------------+---------+-----------+----------+--------------+     Summary: RIGHT: - There is no evidence of deep vein thrombosis in the lower extremity.  - No cystic structure found in the popliteal fossa.  LEFT: - No evidence of common femoral vein obstruction.   *See table(s) above for measurements and observations. Electronically signed by Coral Else MD on 03/13/2023 at 7:10:28 PM.    Final        Thad Ranger M.D. [facility] 03/14/2023, 9:57 AM  Available via Epic secure chat 7am-7pm After 7 pm, please refer to night coverage provider listed on amion.

## 2023-03-15 ENCOUNTER — Inpatient Hospital Stay (HOSPITAL_COMMUNITY): Payer: Medicare HMO

## 2023-03-15 ENCOUNTER — Other Ambulatory Visit: Payer: Self-pay

## 2023-03-15 ENCOUNTER — Encounter (HOSPITAL_COMMUNITY)
Admission: EM | Disposition: A | Discharge status: Home Health | Payer: Self-pay | Source: Home / Self Care | Attending: Internal Medicine

## 2023-03-15 ENCOUNTER — Encounter (HOSPITAL_COMMUNITY): Payer: Self-pay | Admitting: Internal Medicine

## 2023-03-15 DIAGNOSIS — K2100 Gastro-esophageal reflux disease with esophagitis, without bleeding: Secondary | ICD-10-CM | POA: Diagnosis not present

## 2023-03-15 DIAGNOSIS — J449 Chronic obstructive pulmonary disease, unspecified: Secondary | ICD-10-CM | POA: Diagnosis not present

## 2023-03-15 DIAGNOSIS — I739 Peripheral vascular disease, unspecified: Secondary | ICD-10-CM

## 2023-03-15 DIAGNOSIS — T148XXA Other injury of unspecified body region, initial encounter: Secondary | ICD-10-CM | POA: Diagnosis not present

## 2023-03-15 DIAGNOSIS — L03115 Cellulitis of right lower limb: Secondary | ICD-10-CM | POA: Diagnosis not present

## 2023-03-15 DIAGNOSIS — L97312 Non-pressure chronic ulcer of right ankle with fat layer exposed: Secondary | ICD-10-CM | POA: Diagnosis not present

## 2023-03-15 HISTORY — PX: INCISION AND DRAINAGE OF WOUND: SHX1803

## 2023-03-15 LAB — CBC
HCT: 26.8 % — ABNORMAL LOW (ref 36.0–46.0)
Hemoglobin: 8.7 g/dL — ABNORMAL LOW (ref 12.0–15.0)
MCH: 31.5 pg (ref 26.0–34.0)
MCHC: 32.5 g/dL (ref 30.0–36.0)
MCV: 97.1 fL (ref 80.0–100.0)
Platelets: 265 10*3/uL (ref 150–400)
RBC: 2.76 MIL/uL — ABNORMAL LOW (ref 3.87–5.11)
RDW: 17.6 % — ABNORMAL HIGH (ref 11.5–15.5)
WBC: 9.7 10*3/uL (ref 4.0–10.5)
nRBC: 0 % (ref 0.0–0.2)

## 2023-03-15 LAB — BASIC METABOLIC PANEL
Anion gap: 8 (ref 5–15)
BUN: 35 mg/dL — ABNORMAL HIGH (ref 8–23)
CO2: 21 mmol/L — ABNORMAL LOW (ref 22–32)
Calcium: 8.2 mg/dL — ABNORMAL LOW (ref 8.9–10.3)
Chloride: 106 mmol/L (ref 98–111)
Creatinine, Ser: 1.51 mg/dL — ABNORMAL HIGH (ref 0.44–1.00)
GFR, Estimated: 34 mL/min — ABNORMAL LOW (ref >60)
Glucose, Bld: 156 mg/dL — ABNORMAL HIGH (ref 70–99)
Potassium: 3.9 mmol/L (ref 3.5–5.1)
Sodium: 135 mmol/L (ref 135–145)

## 2023-03-15 SURGERY — IRRIGATION AND DEBRIDEMENT WOUND
Anesthesia: Monitor Anesthesia Care | Site: Ankle | Laterality: Right

## 2023-03-15 MED ORDER — PROPOFOL 500 MG/50ML IV EMUL
INTRAVENOUS | Status: DC | PRN
  Administered 2023-03-15: 50 ug/kg/min via INTRAVENOUS

## 2023-03-15 MED ORDER — VANCOMYCIN HCL IN DEXTROSE 1-5 GM/200ML-% IV SOLN
INTRAVENOUS | Status: AC
  Filled 2023-03-15: qty 200

## 2023-03-15 MED ORDER — LACTATED RINGERS IV SOLN: INTRAVENOUS | Status: DC

## 2023-03-15 MED ORDER — ONDANSETRON HCL 4 MG/2ML IJ SOLN
INTRAMUSCULAR | Status: DC | PRN
  Administered 2023-03-15: 4 mg via INTRAVENOUS

## 2023-03-15 MED ORDER — FENTANYL CITRATE (PF) 100 MCG/2ML IJ SOLN: 25.0000 ug | INTRAMUSCULAR | Status: DC | PRN

## 2023-03-15 MED ORDER — PROPOFOL 10 MG/ML IV BOLUS
INTRAVENOUS | Status: AC
  Filled 2023-03-15: qty 20

## 2023-03-15 MED ORDER — SODIUM CHLORIDE 0.9 % IR SOLN
Status: DC | PRN
  Administered 2023-03-15: 1

## 2023-03-15 MED ORDER — LIDOCAINE 2% (20 MG/ML) 5 ML SYRINGE
INTRAMUSCULAR | Status: AC
  Filled 2023-03-15: qty 5

## 2023-03-15 MED ORDER — PROPOFOL 10 MG/ML IV BOLUS
INTRAVENOUS | Status: DC | PRN
  Administered 2023-03-15: 50 mg via INTRAVENOUS

## 2023-03-15 MED ORDER — CHLORHEXIDINE GLUCONATE 0.12 % MT SOLN: 15.0000 mL | Freq: Once | OROMUCOSAL | Status: AC

## 2023-03-15 MED ORDER — POLYETHYLENE GLYCOL 3350 17 G PO PACK
17.0000 g | PACK | Freq: Every day | ORAL | Status: DC
  Administered 2023-03-15 – 2023-03-19 (×4): 17 g via ORAL
  Filled 2023-03-15 (×5): qty 1

## 2023-03-15 MED ORDER — PROPOFOL 1000 MG/100ML IV EMUL
INTRAVENOUS | Status: AC
  Filled 2023-03-15: qty 200

## 2023-03-15 MED ORDER — ONDANSETRON HCL 4 MG/2ML IJ SOLN
INTRAMUSCULAR | Status: AC
  Filled 2023-03-15: qty 2

## 2023-03-15 MED ORDER — CHLORHEXIDINE GLUCONATE 0.12 % MT SOLN
OROMUCOSAL | Status: AC
  Administered 2023-03-15: 15 mL via OROMUCOSAL
  Filled 2023-03-15: qty 15

## 2023-03-15 MED ORDER — BUPIVACAINE HCL (PF) 0.5 % IJ SOLN
INTRAMUSCULAR | Status: AC
  Filled 2023-03-15: qty 30

## 2023-03-15 MED ORDER — BUPIVACAINE HCL (PF) 0.5 % IJ SOLN
INTRAMUSCULAR | Status: DC | PRN
  Administered 2023-03-15: 20 mL

## 2023-03-15 MED ORDER — SENNOSIDES-DOCUSATE SODIUM 8.6-50 MG PO TABS
1.0000 | ORAL_TABLET | Freq: Two times a day (BID) | ORAL | Status: DC
  Administered 2023-03-15 – 2023-03-19 (×5): 1 via ORAL
  Filled 2023-03-15 (×9): qty 1

## 2023-03-15 MED ORDER — ORAL CARE MOUTH RINSE: 15.0000 mL | Freq: Once | OROMUCOSAL | Status: AC

## 2023-03-15 MED ORDER — ONDANSETRON HCL 4 MG/2ML IJ SOLN: 4.0000 mg | Freq: Once | INTRAMUSCULAR | Status: DC | PRN

## 2023-03-15 MED ORDER — BISACODYL 10 MG RE SUPP: 10.0000 mg | Freq: Every day | RECTAL | Status: DC | PRN

## 2023-03-15 MED ORDER — AMISULPRIDE (ANTIEMETIC) 5 MG/2ML IV SOLN: 10.0000 mg | Freq: Once | INTRAVENOUS | Status: DC | PRN

## 2023-03-15 MED ORDER — DEXAMETHASONE SODIUM PHOSPHATE 10 MG/ML IJ SOLN
INTRAMUSCULAR | Status: DC | PRN
  Administered 2023-03-15: 5 mg via INTRAVENOUS

## 2023-03-15 SURGICAL SUPPLY — 57 items
BAG COUNTER SPONGE SURGICOUNT (BAG) ×1 IMPLANT
BLADE SAW SGTL 83.5X18.5 (BLADE) ×1 IMPLANT
BLADE SURG 15 STRL LF DISP TIS (BLADE) ×3 IMPLANT
BNDG COHESIVE 6X5 TAN ST LF (GAUZE/BANDAGES/DRESSINGS) IMPLANT
BNDG ELASTIC 4INX 5YD STR LF (GAUZE/BANDAGES/DRESSINGS) IMPLANT
BNDG ELASTIC 4X5.8 VLCR STR LF (GAUZE/BANDAGES/DRESSINGS) IMPLANT
BNDG ESMARK 4X9 LF (GAUZE/BANDAGES/DRESSINGS) ×1 IMPLANT
BNDG GAUZE DERMACEA FLUFF 4 (GAUZE/BANDAGES/DRESSINGS) ×2 IMPLANT
BOWL SMART MIX CTS (DISPOSABLE) IMPLANT
CEMENT BONE R 1X40 (Cement) IMPLANT
CHLORAPREP W/TINT 26 (MISCELLANEOUS) ×1 IMPLANT
CNTNR URN SCR LID CUP LEK RST (MISCELLANEOUS) IMPLANT
COVER SURGICAL LIGHT HANDLE (MISCELLANEOUS) ×1 IMPLANT
CUFF TOURN SGL QUICK 18X4 (TOURNIQUET CUFF) ×1 IMPLANT
CUFF TRNQT CYL 34X4.125X (TOURNIQUET CUFF) ×1 IMPLANT
DRAPE OEC MINIVIEW 54X84 (DRAPES) ×1 IMPLANT
DRAPE U-SHAPE 47X51 STRL (DRAPES) ×1 IMPLANT
DRSG ADAPTIC 3X8 NADH LF (GAUZE/BANDAGES/DRESSINGS) IMPLANT
DRSG XEROFORM 1X8 (GAUZE/BANDAGES/DRESSINGS) IMPLANT
ELECT CAUTERY BLADE 6.4 (BLADE) ×1 IMPLANT
ELECT REM PT RETURN 9FT ADLT (ELECTROSURGICAL)
ELECTRODE REM PT RTRN 9FT ADLT (ELECTROSURGICAL) IMPLANT
GAUZE PAD ABD 8X10 STRL (GAUZE/BANDAGES/DRESSINGS) ×1 IMPLANT
GAUZE SPONGE 4X4 12PLY STRL (GAUZE/BANDAGES/DRESSINGS) ×1 IMPLANT
GAUZE XEROFORM 1X8 LF (GAUZE/BANDAGES/DRESSINGS) ×1 IMPLANT
GLOVE BIO SURGEON STRL SZ8 (GLOVE) ×1 IMPLANT
GLOVE BIOGEL PI IND STRL 8 (GLOVE) ×1 IMPLANT
GOWN STRL REUS W/ TWL LRG LVL3 (GOWN DISPOSABLE) ×2 IMPLANT
GRAFT TISS 20X25 1 THK DERM (Tissue) IMPLANT
HANDPIECE VERSAJET 45 DEG 8 (MISCELLANEOUS) IMPLANT
KIT BASIN OR (CUSTOM PROCEDURE TRAY) ×1 IMPLANT
KIT STIMULAN RAPID CURE 10CC (Orthopedic Implant) ×1 IMPLANT
KIT TURNOVER KIT B (KITS) ×1 IMPLANT
MANIFOLD NEPTUNE II (INSTRUMENTS) ×1 IMPLANT
NDL HYPO 25GX1X1/2 BEV (NEEDLE) ×1 IMPLANT
NDLE BIOPSY JAMSHIDI OT 8X11 (NEEDLE) IMPLANT
NEEDLE HYPO 25GX1X1/2 BEV (NEEDLE) ×1
NS IRRIG 1000ML POUR BTL (IV SOLUTION) ×1 IMPLANT
PACK ORTHO EXTREMITY (CUSTOM PROCEDURE TRAY) ×1 IMPLANT
PAD ARMBOARD 7.5X6 YLW CONV (MISCELLANEOUS) ×2 IMPLANT
PAD CAST 4YDX4 CTTN HI CHSV (CAST SUPPLIES) ×1 IMPLANT
PADDING CAST COTTON 6X4 STRL (CAST SUPPLIES) ×1 IMPLANT
SET HNDPC FAN SPRY TIP SCT (DISPOSABLE) IMPLANT
SOL PREP POV-IOD 4OZ 10% (MISCELLANEOUS) ×2 IMPLANT
STAPLER VISISTAT (STAPLE) ×1 IMPLANT
STAPLER VISISTAT 35W (STAPLE) ×1 IMPLANT
STOCKINETTE IMPERVIOUS 9X36 MD (GAUZE/BANDAGES/DRESSINGS) ×1 IMPLANT
SUT PROLENE 3 0 PS 2 (SUTURE) ×1 IMPLANT
SWAB CULTURE ESWAB REG 1ML (MISCELLANEOUS) IMPLANT
SWAB CULTURE LIQ STUART DBL (MISCELLANEOUS) ×1 IMPLANT
SWAB CULTURE LIQUID MINI MALE (MISCELLANEOUS) ×1 IMPLANT
SYR CONTROL 10ML LL (SYRINGE) ×1 IMPLANT
TISSUE ARTHROFLEX THICK 4 (Tissue) ×3 IMPLANT
TOWEL GREEN STERILE (TOWEL DISPOSABLE) ×1 IMPLANT
TOWEL GREEN STERILE FF (TOWEL DISPOSABLE) ×1 IMPLANT
TUBE CONNECTING 12X1/4 (SUCTIONS) ×1 IMPLANT
YANKAUER SUCT BULB TIP NO VENT (SUCTIONS) ×1 IMPLANT

## 2023-03-15 NOTE — Transfer of Care (Signed)
Immediate Anesthesia Transfer of Care Note  Patient: [name]  Procedure(s) Performed: IRRIGATION AND DEBRIDEMENT WOUND (Right)  Patient Location: PACU  Anesthesia Type:MAC  Level of Consciousness: awake, alert , and oriented  Airway & Oxygen Therapy: Patient Spontanous Breathing  Post-op Assessment: Report given to RN and Post -op Vital signs reviewed and stable  Post vital signs: Reviewed and stable  Last Vitals:  Vitals Value Taken Time  BP 123/65 03/15/23 1250  Temp    Pulse 87 03/15/23 1251  Resp 12 03/15/23 1251  SpO2 89 % 03/15/23 1251  Vitals shown include unfiled device data.  Last Pain:  Vitals:   03/15/23 1028  TempSrc:   PainSc: 3          Complications: No notable events documented.

## 2023-03-15 NOTE — Progress Notes (Signed)
Orthopedic Tech Progress Note Patient Details:  [name] 1940/05/08 [id number]  Ortho Devices Type of Ortho Device: Postop shoe/boot Ortho Device/Splint Location: RLE Ortho Device/Splint Interventions: Ordered, Application, Adjustment   Post Interventions Patient Tolerated: Well  Tonye Pearson 03/15/2023, 1:38 PM

## 2023-03-15 NOTE — Op Note (Signed)
Full Operative Report  Date of Operation: 12:38 PM, 03/15/2023   Patient: [name] - 82 y.o. female  Surgeon: Pilar Plate, DPM   Assistant: None  Diagnosis: Ulceration to fat level, lateral right ankle, cellulitis right ankle  Procedure:  1.  Irrigation and excisional debridement of ulceration to subcutaneous fat level with prep for graft, 6 x 3 x 0.5 cm, right lateral ankle 2.  Bone biopsy via cannulated needle, distal fibula right ankle 3.  Application dermal allograft-Arthroflex -6.5 x 3.5 x 0.5 cm, right lateral ankle    Anesthesia: Monitor Anesthesia Care  Ellender, Catheryn Bacon, MD  Anesthesiologist: Leonides Grills, MD CRNA: April Holding, CRNA; [place], Psychiatrist, Scientist, clinical (histocompatibility and immunogenetics)   Estimated Blood Loss: 10 mL  Hemostasis: 1) Anatomical dissection, mechanical compression, electrocautery 2) no tourniquet was used during the procedure  Implants: * No implants in log *  Materials: Prolene suture and skin staples  Injectables: 1) Pre-operatively: 20 cc of 0.5% marcaine plain 2) Post-operatively: None   Specimens: Pathology: Distal fibula bone biopsy, ulceration biopsy right lateral ankle microbiology: Tissue right lateral ankle ulcer for culture and ulceration swab culture post lavage   Antibiotics: IV antibiotics given per schedule on the floor -IV vancomycin  Drains: None  Complications: Patient tolerated the procedure well without complication.   Operative findings: As below in detailed report  Indications for Procedure: [name] presents to [facility], [place] with a chief complaint of chronic ulceration right lateral ankle.  Concern for recurrent wound infections and nonhealing of the wound.  Questionable osteomyelitis in the underlying distal fibula the patient has failed conservative treatments of various modalities. At this time the patient has elected to proceed with surgical correction. All alternatives, risks, and complications of the  procedures were thoroughly explained to the patient. Patient exhibits appropriate understanding of all discussion points and informed consent was signed and obtained in the chart with no guarantees to surgical outcome given or implied.  Description of Procedure: Patient was brought to the operating room. Patient remained on their hospital bed in the supine position. A surgical timeout was performed and all members of the operating room, the procedure, and the surgical site were identified. anesthesia occurred as per anesthesia record. Local anesthetic as previously described was then injected about the operative field in a local infiltrative block.  The operative lower extremity as noted above was then prepped and draped in the usual sterile manner. The following procedure then began.  The wound at the lateral aspect of the right ankle was completely inspected.  The wound measured 6 x 3 x 0.5 cm predebridement the wound base had hypergranular and fibrotic tissue.  There is no evidence of deep probing ulceration or sinus tract. The wound was then prepped for graft coverage. The wound margins were sharply curretaged.  Rongeur was used to obtain a deep tissue culture from the wound base this was sent for culture.  A circular biopsy of the skin ulcerated tissue was excised using 15 blade and sent for pathology.  The entire wound base thoroughly debrided with Versajet Hydro debridement system to the level of healthy viable tissue. The wound was then irrigated with 3L of saline with pulse lavage. The wound measured 6.5 x 3.5 x 0.5 cm postdebridement.  Post lavage cultures swab were taken.  Next due to concern for possible underlying osteomyelitis of distal fibula, a bone biopsy was obtained.  Using a Jamshidi needle a circular core of the bone from the distal  fibula x 1 was obtained.  This was passed the back table and sent for pathology.  Hemostasis was achieved with electrocautery. The acellular graft was then  placed within the wound bed, covering the entire wound measuring 6.5 x 3.5 x 0.5 c. This was then affixed with surgical staples.    The surgical site was then dressed with Adaptic Xeroform 4 x 4 ABD Kerlix Ace. The patient tolerated both the procedure and anesthesia well with vital signs stable throughout. The patient was transferred in good condition and all vital signs stable  from the OR to recovery under the discretion of anesthesia.  Condition: Vital signs stable, neurovascular status unchanged from preoperative   Surgical plan:  No evidence of underlying infection or probing ulceration no sinus tract to distal fibula noted.  Ulceration very clean and healthy postdebridement.  Applied normal skin substitute allograft cadaver skin.  No further surgical plans this admission.  Await culture data for antibiotic therapy recommend long duration antibiotics  The patient will be weightbearing as tolerated in a postop shoe to the operative limb until further instructed. The dressing is to remain clean, dry, and intact. Will continue to follow unless noted elsewhere.   [name], DPM [facility]

## 2023-03-15 NOTE — Progress Notes (Signed)
[facility]                                                                              [name], is a 82 y.o. female, DOB - [birth year], VOZ:[id number] Admit date - 03/13/2023    Outpatient Primary MD for the patient is Benita Stabile, MD  LOS - 2  days  Chief Complaint  Patient presents with   Wound Check   Leg Pain       Brief summary   Patient is a 82 year old female with HTN, HLD, stroke, A-fib on Eliquis, chronic anemia, PAD with arterial wounds of bilateral lower extremity, breast CA status post left mastectomy and chemotherapy presented to ED with nonhealing wound of the right lower extremity. Patient follows with podiatry, Dr. Allena Katz and has been dealing with the ankle wound for about a year. On 11/5, patient was seen by vascular surgery, Dr. Chestine Spore On 11/19, she had arthrectomy, angioplasty of the right lower extremity arteries.  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  She has completed the course of antibiotics. On the morning of admission, she was seen by the home health nurse and noted to have worsening wound and was sent to ED.  Patient noted to have open right ankle ulcer on the lateral aspect with wide area of cellulitis around it. In ED, temp 100.6 F, HR 100, BP stable, on room air. CBC showed WBC 25.3, hemoglobin 11, platelets 338, lactic acid 2.2.  Creatinine 1.7. Podiatry was consulted, admitted for further workup.   Assessment & Plan    Principal Problem:   Sepsis (HCC), POA secondary to infected right ankle arterial ulcer with cellulitis -Met sepsis criteria with fever, tachycardia, leukocytosis, lactic acidosis, source secondary to right ankle ulcer -MRI right foot shows mild to moderate edema and soft tissue swelling of the medial greater than lateral aspect of the ankle and the dorsal lateral midfoot, acute osteomyelitis distal fibular diaphysis and metaphysis -Podiatry following underwent  I&D in OR today with bone biopsy and application of dermal allograft -MRSA PCR positive, blood cultures negative so far -Continue IV Rocephin, vancomycin   Active problems Peripheral artery disease  s/p recent angioplasty and stenting of multiple right lower extremity arteries - Dr. Myra Gianotti -Continue to hold Plavix, eliquis -Will resume once cleared by podiatry after surgery     Chronic systolic CHF, Hypertension -Continue metoprolol - Lasix currently on hold  -Recent echo 05/2022 with EF of 40 to 45%, global hypokinesis    Hyperlipidemia Continue statin    Permanent atrial fibrillation -Rate controlled, continue metoprolol, amiodarone -Eliquis currently on hold    Prolonged Qtc EKG on admission with QTc 529 ms -Avoid QTc prolonging meds, -Maintain K ~ 4, Mg >2  Chronic kidney disease stage 4 - Renal function is poor at baseline with most recent creatinine 1.7. -Creatinine 1.5, baseline   breast cancer  s/p left mastectomy and chemo   Underweight, Estimated body mass index is 15.94 kg/m as calculated from the following:   Height as of this encounter: 5\' 3"  (1.6 m).   Weight as of this encounter: 40.8  kg.  Code Status: Full code DVT Prophylaxis:  heparin injection 5,000 Units Start: 03/13/23 2200   Level of Care: Level of care: Telemetry Medical Family Communication: Updated patient Disposition Plan:      Remains inpatient appropriate: OR today   Procedures:  None  Consultants:   Podiatry  Antimicrobials:   Anti-infectives (From admission, onward)    Start     Dose/Rate Route Frequency Ordered Stop   03/15/23 1300  vancomycin (VANCOREADY) IVPB 500 mg/100 mL        500 mg 100 mL/hr over 60 Minutes Intravenous Every 48 hours 03/13/23 1842     03/15/23 1138  vancomycin (VANCOCIN) 1-5 GM/200ML-% IVPB       Note to Pharmacy: [name] E: cabinet override      03/15/23 1138 03/15/23 2344   03/14/23 1300  cefTRIAXone (ROCEPHIN) 2 g in sodium  chloride 0.9 % 100 mL IVPB        2 g 200 mL/hr over 30 Minutes Intravenous Every 24 hours 03/13/23 1839     03/13/23 1245  vancomycin (VANCOCIN) IVPB 1000 mg/200 mL premix        1,000 mg 200 mL/hr over 60 Minutes Intravenous  Once 03/13/23 1241 03/13/23 1447   03/13/23 1245  cefTRIAXone (ROCEPHIN) 2 g in sodium chloride 0.9 % 100 mL IVPB        2 g 200 mL/hr over 30 Minutes Intravenous  Once 03/13/23 1241 03/13/23 1341          Medications  amiodarone  100 mg Oral QODAY   Chlorhexidine Gluconate Cloth  6 each Topical Q0600   heparin injection (subcutaneous)  5,000 Units Subcutaneous Q12H   metoprolol tartrate  75 mg Oral BID   mupirocin ointment  1 Application Nasal BID   pantoprazole  40 mg Oral Daily   polyethylene glycol  17 g Oral Daily   pravastatin  40 mg Oral Daily   senna-docusate  1 tablet Oral BID   umeclidinium bromide  1 puff Inhalation Daily      Subjective:   [name] was seen and examined today.  Pleasant, no acute complaints, seen this morning, waiting for OR.  No nausea vomiting, chest pain or shortness of breath.    Objective:   Vitals:   03/15/23 1021 03/15/23 1250 03/15/23 1300 03/15/23 1325  BP: (!) 163/86 123/65 (!) 148/66 (!) 152/80  Pulse: 88 87 81 88  Resp: 18 12 19 16   Temp: 97.9 F (36.6 C) 98 F (36.7 C)  97.9 F (36.6 C)  TempSrc:    Oral  SpO2: 90% 93% 94% 94%  Weight: 40.8 kg     Height: 5\' 3"  (1.6 m)       Intake/Output Summary (Last 24 hours) at 03/15/2023 1523 Last data filed at 03/15/2023 1238 Gross per 24 hour  Intake 300 ml  Output 5 ml  Net 295 ml     Wt Readings from Last 3 Encounters:  03/15/23 40.8 kg  03/04/23 41.3 kg  02/26/23 40.8 kg   Physical Exam General: Alert and oriented x 3, NAD Cardiovascular: S1 S2 clear, RRR.  Respiratory: CTAB Gastrointestinal: Soft, nontender, nondistended, NBS Ext: no pedal edema bilaterally Neuro: no new deficits Skin: Dressing intact right lower extremity Psych:  Normal affect     Data Reviewed:  I have personally reviewed following labs    CBC Lab Results  Component Value Date   WBC 9.7 03/15/2023   RBC 2.76 (L) 03/15/2023   HGB 8.7 (  L) 03/15/2023   HCT 26.8 (L) 03/15/2023   MCV 97.1 03/15/2023   MCH 31.5 03/15/2023   PLT 265 03/15/2023   MCHC 32.5 03/15/2023   RDW 17.6 (H) 03/15/2023   LYMPHSABS 1.0 03/13/2023   MONOABS 0.8 03/13/2023   EOSABS 0.0 03/13/2023   BASOSABS 0.3 (H) 03/13/2023     Last metabolic panel Lab Results  Component Value Date   NA 135 03/15/2023   K 3.9 03/15/2023   CL 106 03/15/2023   CO2 21 (L) 03/15/2023   BUN 35 (H) 03/15/2023   CREATININE 1.51 (H) 03/15/2023   GLUCOSE 156 (H) 03/15/2023   GFRNONAA 34 (L) 03/15/2023   GFRAA >60 02/20/2015   CALCIUM 8.2 (L) 03/15/2023   PHOS 2.2 (L) 03/06/2022   PROT 7.2 03/13/2023   ALBUMIN 3.1 (L) 03/13/2023   BILITOT 0.8 03/13/2023   ALKPHOS 63 03/13/2023   AST 30 03/13/2023   ALT 23 03/13/2023   ANIONGAP 8 03/15/2023    CBG (last 3)  No results for input(s): "GLUCAP" in the last 72 hours.    Coagulation Profile: No results for input(s): "INR", "PROTIME" in the last 168 hours.   Radiology Studies: I have personally reviewed the imaging studies  MR ANKLE RIGHT WO CONTRAST  Result Date: 03/14/2023 CLINICAL DATA:  Distal fibular osteomyelitis suspected. EXAM: MRI OF THE RIGHT ANKLE WITHOUT CONTRAST TECHNIQUE: Multiplanar, multisequence MR imaging of the ankle was performed. No intravenous contrast was administered. COMPARISON:  Right ankle radiographs 03/13/2023 FINDINGS: Despite efforts by the technologist and patient, mild to moderate motion artifact is present on today's exam and could not be eliminated. This reduces exam sensitivity and specificity. TENDONS Peroneal: The peroneus longus and brevis tendons are intact. Posteromedial: Intact tibialis posterior, flexor digitorum longus, and flexor hallucis longus tendons. Anterior: The tibialis anterior,  extensor hallucis longus, and extensor digitorum longus tendons are intact. Achilles: Intact. Plantar Fascia: Intact. LIGAMENTS Lateral: The anterior and posterior talofibular, anterior and posterior tibiofibular, and calcaneofibular ligaments are intact. Medial: The tibiotalar deep deltoid and tibial spring ligaments are intact. CARTILAGE Ankle Joint: Within the limitations of patient motion artifact, there appears to be mild-to-moderate tibiotalar cartilage thinning. Subtalar Joints/Sinus Tarsi: The subtalar cartilage is intact.Fat is preserved within the sinus tarsi. Bones: Mild superior calcaneocuboid, talonavicular, navicular-cuneiform, and tarsometatarsal cartilage thinning. Minimal subchondral marrow edema at the medial aspect of the first tarsometatarsal joint (axial series 4, image 21), degenerative. Other: There is mild-to-moderate edema and soft tissue swelling of the medial greater than lateral aspects of the ankle and of the dorsal lateral midfoot. This is greatest within the dorsolateral midfoot at the level of the metatarsals. Mild edema within the visualized flexor hallucis longus muscle at the posterosuperior ankle/distal calf. There is thinning of the skin and subcutaneous fat from a wound at the lateral aspect of the distal fibular diaphysis and metaphysis, measuring up to approximately 3.1 cm in craniocaudal dimension and approximately 0.3 cm in transverse depth. Within the limitations of patient motion artifact, no adjacent distal fibular cortical erosion is seen. No definite marrow edema to indicate acute osteomyelitis. IMPRESSION: 1. Mild-to-moderate edema and soft tissue swelling of the medial greater than lateral aspects of the ankle and of the dorsal lateral midfoot. 2. Wound just lateral to the distal fibular diaphysis and metaphysis. Within the limitations of patient motion artifact, no adjacent distal fibular cortical erosion or marrow edema is to indicate acute osteomyelitis. 3.  Mild-to-moderate tibiotalar cartilage thinning. 4. Mild midfoot osteoarthritis. Electronically Signed   By:  Neita Garnet M.D.   On: 03/14/2023 12:29   DG Ankle Complete Right  Result Date: 03/13/2023 CLINICAL DATA:  Cellulitis. EXAM: RIGHT ANKLE - COMPLETE 3+ VIEW COMPARISON:  None Available. FINDINGS: There is no acute fracture or dislocation. The bones are osteopenic. The ankle mortise is intact. Skin wound over the lateral malleolus. Scattered soft tissue calcification likely sequela chronic venous stasis. IMPRESSION: 1. No acute fracture or dislocation. 2. Skin wound over the lateral malleolus. Electronically Signed   By: Elgie Collard M.D.   On: 03/13/2023 19:43   VAS [place] LOWER EXTREMITY VENOUS (DVT)  Result Date: 03/13/2023  Lower Venous DVT Study Patient Name:  [name]  Date of Exam:   03/13/2023 Medical Rec #: [id number]     Accession #:    [id number] Date of Birth: 03/26/1941     Patient Gender: F Patient Age:   36 years Exam Location:  [facility] Procedure:      VAS [place] LOWER EXTREMITY VENOUS (DVT) Referring Phys: DAN FLOYD --------------------------------------------------------------------------------  Indications: Pain, and ulceration.  Limitations: Open wound. Comparison Study: No prior exam. Performing Technologist: Fernande Bras  Examination Guidelines: A complete evaluation includes B-mode imaging, spectral Doppler, color Doppler, and power Doppler as needed of all accessible portions of each vessel. Bilateral testing is considered an integral part of a complete examination. Limited examinations for reoccurring indications may be performed as noted. The reflux portion of the exam is performed with the patient in reverse Trendelenburg.  +---------+---------------+---------+-----------+----------+--------------+ RIGHT    CompressibilityPhasicitySpontaneityPropertiesThrombus Aging +---------+---------------+---------+-----------+----------+--------------+ CFV       Full           Yes      Yes                                 +---------+---------------+---------+-----------+----------+--------------+ SFJ      Full                                                        +---------+---------------+---------+-----------+----------+--------------+ FV Prox  Full                                                        +---------+---------------+---------+-----------+----------+--------------+ FV Mid   Full                                                        +---------+---------------+---------+-----------+----------+--------------+ FV DistalFull                                                        +---------+---------------+---------+-----------+----------+--------------+ PFV      Full                                                        +---------+---------------+---------+-----------+----------+--------------+  POP      Full           Yes      Yes                                 +---------+---------------+---------+-----------+----------+--------------+ PTV      Full                                                        +---------+---------------+---------+-----------+----------+--------------+ PERO     Full                                                        +---------+---------------+---------+-----------+----------+--------------+ Edema noted in right calf. Enlarged lymph node noted in right groin measuing 2 x 1.1 x 1.6 cm, with vascularity.  +----+---------------+---------+-----------+----------+--------------+ LEFTCompressibilityPhasicitySpontaneityPropertiesThrombus Aging +----+---------------+---------+-----------+----------+--------------+ CFV Full           Yes      Yes                                 +----+---------------+---------+-----------+----------+--------------+     Summary: RIGHT: - There is no evidence of deep vein thrombosis in the lower extremity.  - No cystic structure found in  the popliteal fossa.  LEFT: - No evidence of common femoral vein obstruction.   *See table(s) above for measurements and observations. Electronically signed by Coral Else MD on 03/13/2023 at 7:10:28 PM.    Final        Thad Ranger M.D. [facility] 03/15/2023, 3:23 PM  Available via Epic secure chat 7am-7pm After 7 pm, please refer to night coverage provider listed on amion.

## 2023-03-15 NOTE — Anesthesia Preprocedure Evaluation (Addendum)
Anesthesia Evaluation  Patient identified by MRN, date of birth, ID band Patient awake    Reviewed: Allergy & Precautions, NPO status , Patient's Chart, lab work & pertinent test results  Airway Mallampati: II  TM Distance: >3 FB Neck ROM: Full    Dental  (+) Missing   Pulmonary COPD,  COPD inhaler, former smoker   Pulmonary exam normal        Cardiovascular hypertension, Pt. on medications and Pt. on home beta blockers + Peripheral Vascular Disease and +CHF  Normal cardiovascular exam+ dysrhythmias Atrial Fibrillation   ECHO: 1. Left ventricular ejection fraction, by estimation, is 40 to 45%. The  left ventricle has mildly decreased function. The left ventricle  demonstrates global hypokinesis. There is mild asymmetric left ventricular  hypertrophy of the basal segment. Left  ventricular diastolic parameters are indeterminate.   2. Right ventricular systolic function is normal. The right ventricular  size is normal. There is mildly elevated pulmonary artery systolic  pressure. The estimated right ventricular systolic pressure is 39.7 mmHg.   3. Left atrial size was moderately dilated.   4. The mitral valve is degenerative. Mild to moderate mitral valve  regurgitation.   5. The aortic valve is tricuspid. Aortic valve regurgitation is mild.  Aortic valve sclerosis/calcification is present, without any evidence of  aortic stenosis.   6. The inferior vena cava is normal in size with greater than 50%  respiratory variability, suggesting right atrial pressure of 3 mmHg.      Neuro/Psych CVA  negative psych ROS   GI/Hepatic Neg liver ROS, PUD,GERD  Medicated and Controlled,,  Endo/Other  negative endocrine ROS    Renal/GU Renal InsufficiencyRenal disease     Musculoskeletal  (+) Arthritis ,    Abdominal   Peds  Hematology  (+) Blood dyscrasia (Eliquis, Plavix), anemia   Anesthesia Other Findings Cellulitis right  ankle  Reproductive/Obstetrics                             Anesthesia Physical Anesthesia Plan  ASA: 3  Anesthesia Plan: MAC   Post-op Pain Management:    Induction:   PONV Risk Score and Plan: 2 and Ondansetron, Dexamethasone, Propofol infusion and Treatment may vary due to age or medical condition  Airway Management Planned: Simple Face Mask  Additional Equipment:   Intra-op Plan:   Post-operative Plan:   Informed Consent: I have reviewed the patients History and Physical, chart, labs and discussed the procedure including the risks, benefits and alternatives for the proposed anesthesia with the patient or authorized representative who has indicated his/her understanding and acceptance.     Dental advisory given  Plan Discussed with: CRNA  Anesthesia Plan Comments:        Anesthesia Quick Evaluation

## 2023-03-15 NOTE — Progress Notes (Signed)
History and Physical Interval Note:  03/15/2023 11:19 AM  [name]  has presented today for surgery, with the diagnosis of chronic right ankle ulceration.  The various methods of treatment have been discussed with the patient and family. After consideration of risks, benefits and other options for treatment, the patient has consented to   Procedure(s) with comments: IRRIGATION AND DEBRIDEMENT WOUND (Right) - Right ankle ulcer debridement, bone biopsy, graft application as a surgical intervention.  The patient's history has been reviewed, patient examined, no change in status, stable for surgery.  I have reviewed the patient's chart and labs.  Questions were answered to the patient's satisfaction.     Jenelle Mages Katera Rybka

## 2023-03-16 DIAGNOSIS — L03115 Cellulitis of right lower limb: Secondary | ICD-10-CM | POA: Diagnosis not present

## 2023-03-16 DIAGNOSIS — J449 Chronic obstructive pulmonary disease, unspecified: Secondary | ICD-10-CM | POA: Diagnosis not present

## 2023-03-16 DIAGNOSIS — L97312 Non-pressure chronic ulcer of right ankle with fat layer exposed: Secondary | ICD-10-CM | POA: Diagnosis not present

## 2023-03-16 DIAGNOSIS — I4819 Other persistent atrial fibrillation: Secondary | ICD-10-CM | POA: Diagnosis not present

## 2023-03-16 DIAGNOSIS — T148XXA Other injury of unspecified body region, initial encounter: Secondary | ICD-10-CM | POA: Diagnosis not present

## 2023-03-16 LAB — CBC
HCT: 28.5 % — ABNORMAL LOW (ref 36.0–46.0)
Hemoglobin: 9.2 g/dL — ABNORMAL LOW (ref 12.0–15.0)
MCH: 31.7 pg (ref 26.0–34.0)
MCHC: 32.3 g/dL (ref 30.0–36.0)
MCV: 98.3 fL (ref 80.0–100.0)
Platelets: 270 10*3/uL (ref 150–400)
RBC: 2.9 MIL/uL — ABNORMAL LOW (ref 3.87–5.11)
RDW: 17.2 % — ABNORMAL HIGH (ref 11.5–15.5)
WBC: 10 10*3/uL (ref 4.0–10.5)
nRBC: 0 % (ref 0.0–0.2)

## 2023-03-16 LAB — BASIC METABOLIC PANEL
Anion gap: 9 (ref 5–15)
BUN: 27 mg/dL — ABNORMAL HIGH (ref 8–23)
CO2: 20 mmol/L — ABNORMAL LOW (ref 22–32)
Calcium: 7.9 mg/dL — ABNORMAL LOW (ref 8.9–10.3)
Chloride: 107 mmol/L (ref 98–111)
Creatinine, Ser: 1.43 mg/dL — ABNORMAL HIGH (ref 0.44–1.00)
GFR, Estimated: 36 mL/min — ABNORMAL LOW (ref >60)
Glucose, Bld: 104 mg/dL — ABNORMAL HIGH (ref 70–99)
Potassium: 4 mmol/L (ref 3.5–5.1)
Sodium: 136 mmol/L (ref 135–145)

## 2023-03-16 LAB — CK: Total CK: 72 U/L (ref 38–234)

## 2023-03-16 MED ORDER — ONDANSETRON HCL 4 MG/2ML IJ SOLN
4.0000 mg | Freq: Four times a day (QID) | INTRAMUSCULAR | Status: DC | PRN
  Administered 2023-03-16 (×2): 4 mg via INTRAVENOUS
  Filled 2023-03-16 (×3): qty 2

## 2023-03-16 MED ORDER — DAPTOMYCIN 500 MG IV SOLR
8.0000 mg/kg | INTRAVENOUS | Status: DC
  Administered 2023-03-16: 350 mg via INTRAVENOUS
  Filled 2023-03-16 (×2): qty 7

## 2023-03-16 MED ORDER — SMOG ENEMA
960.0000 mL | Freq: Once | RECTAL | Status: AC
  Administered 2023-03-16: 960 mL via RECTAL
  Filled 2023-03-16: qty 960

## 2023-03-16 MED ORDER — CLOPIDOGREL BISULFATE 75 MG PO TABS
75.0000 mg | ORAL_TABLET | Freq: Every morning | ORAL | Status: DC
  Administered 2023-03-17 – 2023-03-20 (×4): 75 mg via ORAL
  Filled 2023-03-16 (×4): qty 1

## 2023-03-16 MED ORDER — APIXABAN 2.5 MG PO TABS
2.5000 mg | ORAL_TABLET | Freq: Two times a day (BID) | ORAL | Status: DC
  Administered 2023-03-16 – 2023-03-20 (×8): 2.5 mg via ORAL
  Filled 2023-03-16 (×8): qty 1

## 2023-03-16 MED ORDER — LACTULOSE 10 GM/15ML PO SOLN
30.0000 g | Freq: Once | ORAL | Status: AC
  Administered 2023-03-16: 30 g via ORAL
  Filled 2023-03-16: qty 45

## 2023-03-16 NOTE — Progress Notes (Addendum)
[facility]                                                                              [name], is a 82 y.o. female, DOB - 1941/03/17, ONG:[id number] Admit date - 03/13/2023    Outpatient Primary MD for the patient is Margo Aye, Kathleene Hazel, MD  LOS - 3  days  Chief Complaint  Patient presents with   Wound Check   Leg Pain       Brief summary   Patient is a 82 year old female with HTN, HLD, stroke, A-fib on Eliquis, chronic anemia, PAD with arterial wounds of bilateral lower extremity, breast CA status post left mastectomy and chemotherapy presented to ED with nonhealing wound of the right lower extremity. Patient follows with podiatry, Dr. Allena Katz and has been dealing with the ankle wound for about a year. On 11/5, patient was seen by vascular surgery, Dr. Chestine Spore On 11/19, she had arthrectomy, angioplasty of the right lower extremity arteries.  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  She has completed the course of antibiotics. On the morning of admission, she was seen by the home health nurse and noted to have worsening wound and was sent to ED.  Patient noted to have open right ankle ulcer on the lateral aspect with wide area of cellulitis around it. In ED, temp 100.6 F, HR 100, BP stable, on room air. CBC showed WBC 25.3, hemoglobin 11, platelets 338, lactic acid 2.2.  Creatinine 1.7. Podiatry was consulted, admitted for further workup.   Assessment & Plan    Principal Problem:   Sepsis (HCC), POA secondary to infected right ankle arterial ulcer with cellulitis -Met sepsis criteria with fever, tachycardia, leukocytosis, lactic acidosis, source secondary to right ankle ulcer -MRI right foot shows mild to moderate edema and soft tissue swelling of the medial greater than lateral aspect of the ankle and the dorsal lateral midfoot, acute osteomyelitis distal fibular diaphysis and metaphysis -Blood cultures negative so  far -Continue IV Rocephin, vancomycin -Podiatry following, underwent I&D of ulceration to the subcutaneous fat level, right lateral ankle, bone biopsy application of dermal allograft -Await wound cultures, will consult ID   Active problems Peripheral artery disease  s/p recent angioplasty and stenting of multiple right lower extremity arteries - Dr. Myra Gianotti -Hold Plavix, eliquis until confirmed from podiatry that patient will not need repeat OR this admission Addendum: D/w Dr Annamary Rummage, no further debridement or surgery planned so ok to resume eliquis and plavix     Chronic systolic CHF, Hypertension -Continue metoprolol - Lasix currently on hold  -Recent echo 05/2022 with EF of 40 to 45%, global hypokinesis    Hyperlipidemia Continue statin    Permanent atrial fibrillation -Rate controlled, continue metoprolol, amiodarone -Eliquis currently on hold    Prolonged Qtc EKG on admission with QTc 529 ms -Avoid QTc prolonging meds, -Maintain K ~ 4, Mg >2  Chronic kidney disease stage 4 - Renal function is poor at baseline with most recent creatinine 1.7. -Creatinine 1.5   breast cancer  s/p left mastectomy and chemo  Constipation -States no BM since admission -  Continue Senokot, MiraLAX 17 g twice daily - smog enema x 1  Underweight, Estimated body mass index is 15.94 kg/m as calculated from the following:   Height as of this encounter: 5\' 3"  (1.6 m).   Weight as of this encounter: 40.8 kg.  Code Status: Full code DVT Prophylaxis:  heparin injection 5,000 Units Start: 03/13/23 2200   Level of Care: Level of care: Telemetry Medical Family Communication: Updated patient Disposition Plan:      Remains inpatient appropriate: Follow wound cultures   Procedures:  1.  Irrigation and excisional debridement of ulceration to subcutaneous fat level with prep for graft, 6 x 3 x 0.5 cm, right lateral ankle 2.  Bone biopsy via cannulated needle, distal fibula right ankle 3.   Application dermal allograft-Arthroflex -6.5 x 3.5 x 0.5 cm, right lateral ankle  Consultants:   Podiatry  Antimicrobials:   Anti-infectives (From admission, onward)    Start     Dose/Rate Route Frequency Ordered Stop   03/15/23 1300  vancomycin (VANCOREADY) IVPB 500 mg/100 mL        500 mg 100 mL/hr over 60 Minutes Intravenous Every 48 hours 03/13/23 1842     03/15/23 1138  vancomycin (VANCOCIN) 1-5 GM/200ML-% IVPB       Note to Pharmacy: [name] E: cabinet override      03/15/23 1138 03/15/23 2344   03/14/23 1300  cefTRIAXone (ROCEPHIN) 2 g in sodium chloride 0.9 % 100 mL IVPB        2 g 200 mL/hr over 30 Minutes Intravenous Every 24 hours 03/13/23 1839     03/13/23 1245  vancomycin (VANCOCIN) IVPB 1000 mg/200 mL premix        1,000 mg 200 mL/hr over 60 Minutes Intravenous  Once 03/13/23 1241 03/13/23 1447   03/13/23 1245  cefTRIAXone (ROCEPHIN) 2 g in sodium chloride 0.9 % 100 mL IVPB        2 g 200 mL/hr over 30 Minutes Intravenous  Once 03/13/23 1241 03/13/23 1341          Medications  amiodarone  100 mg Oral QODAY   Chlorhexidine Gluconate Cloth  6 each Topical Q0600   heparin injection (subcutaneous)  5,000 Units Subcutaneous Q12H   metoprolol tartrate  75 mg Oral BID   mupirocin ointment  1 Application Nasal BID   pantoprazole  40 mg Oral Daily   polyethylene glycol  17 g Oral Daily   pravastatin  40 mg Oral Daily   senna-docusate  1 tablet Oral BID   umeclidinium bromide  1 puff Inhalation Daily      Subjective:   [name] was seen and examined today.  No acute complaints, questions about her surgery yesterday.  Still constipated.  No nausea vomiting, chest pain or shortness of breath.    Objective:   Vitals:   03/15/23 2006 03/16/23 0610 03/16/23 0841 03/16/23 0846  BP: 130/85 (!) 154/83  (!) 144/84  Pulse: 86 88  89  Resp: 17 17  16   Temp: 98.1 F (36.7 C)   97.7 F (36.5 C)  TempSrc: Oral   Oral  SpO2: 94% 95% 95% 94%  Weight:       Height:        Intake/Output Summary (Last 24 hours) at 03/16/2023 1242 Last data filed at 03/16/2023 0800 Gross per 24 hour  Intake 240 ml  Output 450 ml  Net -210 ml     Wt Readings from Last 3 Encounters:  03/15/23 40.8 kg  03/04/23 41.3 kg  02/26/23 40.8 kg   Physical Exam General: Alert and oriented x 3, NAD Cardiovascular: S1 S2 clear, RRR.  Respiratory: CTAB Gastrointestinal: Soft, NT, ND, NBS Ext: no pedal edema bilaterally Neuro: no new deficits Skin: Right lower extremity dressing intact Psych: Normal affect    Data Reviewed:  I have personally reviewed following labs    CBC Lab Results  Component Value Date   WBC 10.0 03/16/2023   RBC 2.90 (L) 03/16/2023   HGB 9.2 (L) 03/16/2023   HCT 28.5 (L) 03/16/2023   MCV 98.3 03/16/2023   MCH 31.7 03/16/2023   PLT 270 03/16/2023   MCHC 32.3 03/16/2023   RDW 17.2 (H) 03/16/2023   LYMPHSABS 1.0 03/13/2023   MONOABS 0.8 03/13/2023   EOSABS 0.0 03/13/2023   BASOSABS 0.3 (H) 03/13/2023     Last metabolic panel Lab Results  Component Value Date   NA 136 03/16/2023   K 4.0 03/16/2023   CL 107 03/16/2023   CO2 20 (L) 03/16/2023   BUN 27 (H) 03/16/2023   CREATININE 1.43 (H) 03/16/2023   GLUCOSE 104 (H) 03/16/2023   GFRNONAA 36 (L) 03/16/2023   GFRAA >60 02/20/2015   CALCIUM 7.9 (L) 03/16/2023   PHOS 2.2 (L) 03/06/2022   PROT 7.2 03/13/2023   ALBUMIN 3.1 (L) 03/13/2023   BILITOT 0.8 03/13/2023   ALKPHOS 63 03/13/2023   AST 30 03/13/2023   ALT 23 03/13/2023   ANIONGAP 9 03/16/2023    CBG (last 3)  No results for input(s): "GLUCAP" in the last 72 hours.    Coagulation Profile: No results for input(s): "INR", "PROTIME" in the last 168 hours.   Radiology Studies: I have personally reviewed the imaging studies  No results found.     Thad Ranger M.D. [facility] 03/16/2023, 12:42 PM  Available via Epic secure chat 7am-7pm After 7 pm, please refer to night coverage provider listed on  amion.

## 2023-03-16 NOTE — Progress Notes (Signed)
RN found a 2.5mg  oxycodone tablet in the patient's bed. Tablet was in a clear medication bag and split inside of original medication packaging. Unable to locate the origin of the tablet. Wasting with charge nurse Clayburn Pert, RN.

## 2023-03-16 NOTE — Consult Note (Signed)
[facility]  Total days of antibiotics 4 ceftriaxone Reason for Consult:left distal fibula osteomyelitis   Referring Physician: Isidoro Donning  Principal Problem:   Sepsis (HCC) Active Problems:   Persistent atrial fibrillation (HCC)   CAD in native artery   COPD, moderate (HCC)   PAD (peripheral artery disease) (HCC)   GERD (gastroesophageal reflux disease)   Cellulitis of right lower extremity   Skin ulcer of right ankle with fat layer exposed (HCC)    HPI: [name] is a 82 y.o. female  with hx of HTN, HLD, CVA, PAD, afib on anticoagulation, hx of breat ca s/p mastecomty and chemo who has had ongoing right ankle non-healing wound followed by podiatry. She has undergone angioplasty on 11/5 and 11/19. However, her right ankle wound continued to worsening including having fevers up to 100.69F with tachycardia. LA 2.2 WBC of 25K. Mild aki. She was started on empiric abtx after blood cx taken. Her wound is described as having Ulceration to fat level, lateral right ankle, cellulitis right ankle. Podiatry took her to the OR on 12/6 for    1.  Irrigation and excisional debridement of ulceration to subcutaneous fat level with prep for graft, 6 x 3 x 0.5 cm, right lateral ankle 2.  Bone biopsy via cannulated needle, distal fibula right ankle 3.  Application dermal allograft-Arthroflex -6.5 x 3.5 x 0.5 cm, right lateral ankle  Cultures are showing GPC in pairs on gram stain. She is colonized with MRSA Past Medical History:  Diagnosis Date   Anemia 05/2014.   Atrial fibrillation (HCC)    x 3 yrs   Breast cancer (HCC)    S/P left mastectomy and chemotherapy 1989 remained in remission   Carotid artery occlusion    Carotid bruit    LICA 60-79% (duplex 9/11)   Degenerative arthritis of spine 2015   Chronic back pain   Hyperlipidemia    Hypertension    Meningioma (HCC)    Osteoporosis    Pneumonia May 19, 2014   Stroke [facility])    CVA 2008   Subclavian steal  syndrome    Ulcers of both lower extremities (HCC) 2015    Allergies:  Allergies  Allergen Reactions   Iodine Rash and Other (See Comments)    BETADINE, blistering rash/burns   Iohexol Rash and Other (See Comments)    Blisters; PT NEEDS 13-HOUR PREP    Petroleum Gauze Non-Woven 3x9" [Wound Dressings] Other (See Comments)    BURNING SENSATION, RED SKIN   Sulfa Antibiotics Nausea And Vomiting   Wound Dressing Adhesive Other (See Comments)    Burns skin   Tape Itching, Rash and Other (See Comments)    "DO NOT USE ADHESIVE TAPE" Not even a Band Aid" NO PAPER TAPE Burns skin Skin is very very thin    MEDICATIONS:  amiodarone  100 mg Oral QODAY   Chlorhexidine Gluconate Cloth  6 each Topical Q0600   heparin injection (subcutaneous)  5,000 Units Subcutaneous Q12H   metoprolol tartrate  75 mg Oral BID   mupirocin ointment  1 Application Nasal BID   pantoprazole  40 mg Oral Daily   polyethylene glycol  17 g Oral Daily   pravastatin  40 mg Oral Daily   senna-docusate  1 tablet Oral BID   umeclidinium bromide  1 puff Inhalation Daily    Social History   Tobacco Use   Smoking status: Former    Current packs/day: 0.00    Average packs/day:  1 pack/day for 56.0 years (56.0 ttl pk-yrs)    Types: Cigarettes    Start date: 08/14/1950    Quit date: 08/14/2006    Years since quitting: 16.5    Passive exposure: Never   Smokeless tobacco: Never   Tobacco comments:    quit in 2008  Vaping Use   Vaping status: Never Used  Substance Use Topics   Alcohol use: No    Alcohol/week: 0.0 standard drinks of alcohol   Drug use: No    Family History  Problem Relation Age of Onset   Alzheimer's disease Mother    Dementia Mother    Hypertension Mother    Hyperlipidemia Mother    Parkinsonism Father     Review of Systems - 12 point ros is otherwise negative.    OBJECTIVE: Temp:  [97.4 F (36.3 C)-98.1 F (36.7 C)] 97.4 F (36.3 C) (12/07 1347) Pulse Rate:  [86-89] 89 (12/07  1347) Resp:  [16-17] 16 (12/07 1347) BP: (130-155)/(83-87) 155/87 (12/07 1347) SpO2:  [94 %-97 %] 97 % (12/07 1347) Physical Exam  Constitutional:  oriented to person, place, and time. appears well-developed and well-nourished. No distress.  HENT: [place]/AT, PERRLA, no scleral icterus Mouth/Throat: Oropharynx is clear and moist. No oropharyngeal exudate.  Cardiovascular: Normal rate, regular rhythm and normal heart sounds. Exam reveals no gallop and no friction rub.  No murmur heard.  Pulmonary/Chest: Effort normal and breath sounds normal. No respiratory distress.  has no wheezes.  Neck = supple, no nuchal rigidity Abdominal: Soft. Bowel sounds are normal.  exhibits no distension. There is no tenderness.  Lymphadenopathy: no cervical adenopathy. No axillary adenopathy Neurological: alert and oriented to person, place, and time.  Skin: Skin is warm and dry. No rash noted. No erythema. Right leg wrapped Psychiatric: a normal mood and affect.  behavior is normal.    LABS: Results for orders placed or performed during the hospital encounter of 03/13/23 (from the past 48 hour(s))  Surgical pcr screen     Status: Abnormal   Collection Time: 03/14/23  3:09 PM   Specimen: Nasal Mucosa; Nasal Swab  Result Value Ref Range   MRSA, PCR POSITIVE (A) NEGATIVE    Comment: RESULT CALLED TO, READ BACK BY AND VERIFIED WITH: RN JUN GIRAL ON 03/14/23 @ 1800 BY DRT    Staphylococcus aureus POSITIVE (A) NEGATIVE    Comment: (NOTE) The Xpert SA Assay (FDA approved for NASAL specimens in patients 57 years of age and older), is one component of a comprehensive surveillance program. It is not intended to diagnose infection nor to guide or monitor treatment. Performed at [facility], [address], [place], [place] [address]   CBC     Status: Abnormal   Collection Time: 03/15/23  5:28 AM  Result Value Ref Range   WBC 9.7 4.0 - 10.5 K/uL   RBC 2.76 (L) 3.87 - 5.11 MIL/uL   Hemoglobin 8.7 (L) 12.0  - 15.0 g/dL   HCT 72.5 (L) 36.6 - 44.0 %   MCV 97.1 80.0 - 100.0 fL   MCH 31.5 26.0 - 34.0 pg   MCHC 32.5 30.0 - 36.0 g/dL   RDW 34.7 (H) 42.5 - 95.6 %   Platelets 265 150 - 400 K/uL   nRBC 0.0 0.0 - 0.2 %    Comment: Performed at [facility], [address] [place], [place] [address]  Basic metabolic panel     Status: Abnormal   Collection Time: 03/15/23  5:28  AM  Result Value Ref Range   Sodium 135 135 - 145 mmol/L   Potassium 3.9 3.5 - 5.1 mmol/L   Chloride 106 98 - 111 mmol/L   CO2 21 (L) 22 - 32 mmol/L   Glucose, Bld 156 (H) 70 - 99 mg/dL    Comment: Glucose reference range applies only to samples taken after fasting for at least 8 hours.   BUN 35 (H) 8 - 23 mg/dL   Creatinine, Ser 1.61 (H) 0.44 - 1.00 mg/dL   Calcium 8.2 (L) 8.9 - 10.3 mg/dL   GFR, Estimated 34 (L) >60 mL/min    Comment: (NOTE) Calculated using the CKD-EPI Creatinine Equation (2021)    Anion gap 8 5 - 15    Comment: Performed at [facility], [address], [place], [place] [address]  Aerobic/Anaerobic Culture w Gram Stain (surgical/deep wound)     Status: None (Preliminary result)   Collection Time: 03/15/23 12:22 PM   Specimen: Soft Tissue, Other  Result Value Ref Range   Specimen Description TISSUE    Special Requests RIGHT LATERAL ANKLE    Gram Stain      RARE WBC PRESENT, PREDOMINANTLY PMN NO ORGANISMS SEEN    Culture      CULTURE REINCUBATED FOR BETTER GROWTH Performed at [facility], [address], [place], [place] [address]    Report Status PENDING   Aerobic/Anaerobic Culture w Gram Stain (surgical/deep wound)     Status: None (Preliminary result)   Collection Time: 03/15/23 12:32 PM   Specimen: Soft Tissue, Other  Result Value Ref Range   Specimen Description WOUND    Special Requests SWAB OF LEFT ANKLE TISSUE POST LAVAGE    Gram Stain NO WBC SEEN RARE GRAM POSITIVE COCCI IN PAIRS     Culture      CULTURE REINCUBATED FOR BETTER GROWTH Performed at [facility], [address] [place], [place] [address]    Report Status PENDING   CBC     Status: Abnormal   Collection Time: 03/16/23  4:01 AM  Result Value Ref Range   WBC 10.0 4.0 - 10.5 K/uL   RBC 2.90 (L) 3.87 - 5.11 MIL/uL   Hemoglobin 9.2 (L) 12.0 - 15.0 g/dL   HCT 78.2 (L) 95.6 - 21.3 %   MCV 98.3 80.0 - 100.0 fL   MCH 31.7 26.0 - 34.0 pg   MCHC 32.3 30.0 - 36.0 g/dL   RDW 08.6 (H) 57.8 - 46.9 %   Platelets 270 150 - 400 K/uL   nRBC 0.0 0.0 - 0.2 %    Comment: Performed at [facility], [address] [place], [place] [address]  Basic metabolic panel     Status: Abnormal   Collection Time: 03/16/23  4:01 AM  Result Value Ref Range   Sodium 136 135 - 145 mmol/L   Potassium 4.0 3.5 - 5.1 mmol/L   Chloride 107 98 - 111 mmol/L   CO2 20 (L) 22 - 32 mmol/L   Glucose, Bld 104 (H) 70 - 99 mg/dL    Comment: Glucose reference range applies only to samples taken after fasting for at least 8 hours.   BUN 27 (H) 8 - 23 mg/dL   Creatinine, Ser 8.41 (H) 0.44 - 1.00 mg/dL   Calcium 7.9 (L) 8.9 - 10.3 mg/dL   GFR, Estimated 36 (L) >60 mL/min    Comment: (NOTE) Calculated using the CKD-EPI Creatinine Equation (2021)  Anion gap 9 5 - 15    Comment: Performed at [facility], [address], [place], [place] [address]      MICRO: 12/6 - tissue cx pending IMAGING: No results found.   Assessment/Plan:  82 yo F with chronic right ankle wound, underwent I x D and bone biopsy to help with targeting abtx therapy. - fro the time being, we will switch her off of vancomycin and change to daptomycin and continue on ceftriaxone - will check sed rate and crp - will follow up on cx results to see how to narrow her treatment regimen  Amada Hallisey B. Drue Second MD MPH [facility] [phone]

## 2023-03-16 NOTE — Progress Notes (Addendum)
Pharmacy Antibiotic Note  [name] is a 82 y.o. female admitted on 03/13/2023 with osteomyelitis.  Pharmacy has been consulted for Ceftriaxone and Vancomycin dosing.  Patient on Vancomycin 500 mg IV q48h and Ceftriaxone 2 g IV daily for OM of R foot. Went to OR 12/06, with cultures pending. Laboratory markers improving.   PM update: Transitioning to Ceftriaxone + Daptomycin per ID, Dr. Drue Second.  WBC 10, Tmax (24h) 98.1 SCr 1.7 >> 1.43  Plan: Continue Ceftriaxone 2g IV q24h Stop Vancomycin Start Daptomycin 350 mg (8 mg/kg) IV q48h Monitor Weekly CK, temp, SCr, and for clinical signs of improvement  F/u cultures and de-escalate antibiotics as able   Height: 5\' 3"  (160 cm) Weight: 40.8 kg (90 lb) IBW/kg (Calculated) : 52.4  Temp (24hrs), Avg:97.7 F (36.5 C), Min:97.4 F (36.3 C), Max:98.1 F (36.7 C)  Recent Labs  Lab 03/13/23 1115 03/13/23 1145 03/13/23 1341 03/14/23 0458 03/15/23 0528 03/16/23 0401  WBC 25.3*  --   --  17.1* 9.7 10.0  CREATININE 1.70*  --   --  1.62* 1.51* 1.43*  LATICACIDVEN  --  2.2* 1.8  --   --   --     Estimated Creatinine Clearance: 18.9 mL/min (A) (by C-G formula based on SCr of 1.43 mg/dL (H)).    Allergies  Allergen Reactions   Iodine Rash and Other (See Comments)    BETADINE, blistering rash/burns   Iohexol Rash and Other (See Comments)    Blisters; PT NEEDS 13-HOUR PREP    Petroleum Gauze Non-Woven 3x9" [Wound Dressings] Other (See Comments)    BURNING SENSATION, RED SKIN   Sulfa Antibiotics Nausea And Vomiting   Wound Dressing Adhesive Other (See Comments)    Burns skin   Tape Itching, Rash and Other (See Comments)    "DO NOT USE ADHESIVE TAPE" Not even a Band Aid" NO PAPER TAPE Burns skin Skin is very very thin    Antimicrobials this admission: Ceftriaxone 12/4 >>  Vancomycin 12/4 >> 12/07 Daptomycin 12/07 >>  Dose adjustments this admission: N/A  Microbiology results: 12/4 BCx: sent 12/5 MRSA swab: positive 12/6  Tissue cx: No organisms on stain, culture pending 12/6 Wound cx: Stain with GPCs in pairs, culture pending   Thank you for allowing pharmacy to be a part of this patient's care.  Lora Paula, PharmD PGY-2 Infectious Diseases Pharmacy Resident 03/16/2023 3:10 PM  Please refer to [facility] for pharmacy phone number

## 2023-03-17 DIAGNOSIS — T148XXA Other injury of unspecified body region, initial encounter: Secondary | ICD-10-CM | POA: Diagnosis not present

## 2023-03-17 DIAGNOSIS — L03115 Cellulitis of right lower limb: Secondary | ICD-10-CM | POA: Diagnosis not present

## 2023-03-17 DIAGNOSIS — I4819 Other persistent atrial fibrillation: Secondary | ICD-10-CM | POA: Diagnosis not present

## 2023-03-17 DIAGNOSIS — J449 Chronic obstructive pulmonary disease, unspecified: Secondary | ICD-10-CM | POA: Diagnosis not present

## 2023-03-17 LAB — CBC
HCT: 29.8 % — ABNORMAL LOW (ref 36.0–46.0)
Hemoglobin: 9.6 g/dL — ABNORMAL LOW (ref 12.0–15.0)
MCH: 32.1 pg (ref 26.0–34.0)
MCHC: 32.2 g/dL (ref 30.0–36.0)
MCV: 99.7 fL (ref 80.0–100.0)
Platelets: 306 10*3/uL (ref 150–400)
RBC: 2.99 MIL/uL — ABNORMAL LOW (ref 3.87–5.11)
RDW: 17 % — ABNORMAL HIGH (ref 11.5–15.5)
WBC: 10.3 10*3/uL (ref 4.0–10.5)
nRBC: 0.2 % (ref 0.0–0.2)

## 2023-03-17 LAB — BASIC METABOLIC PANEL
Anion gap: 11 (ref 5–15)
BUN: 32 mg/dL — ABNORMAL HIGH (ref 8–23)
CO2: 21 mmol/L — ABNORMAL LOW (ref 22–32)
Calcium: 8.3 mg/dL — ABNORMAL LOW (ref 8.9–10.3)
Chloride: 105 mmol/L (ref 98–111)
Creatinine, Ser: 1.47 mg/dL — ABNORMAL HIGH (ref 0.44–1.00)
GFR, Estimated: 35 mL/min — ABNORMAL LOW (ref >60)
Glucose, Bld: 95 mg/dL (ref 70–99)
Potassium: 4.7 mmol/L (ref 3.5–5.1)
Sodium: 137 mmol/L (ref 135–145)

## 2023-03-17 LAB — SEDIMENTATION RATE: Sed Rate: 92 mm/h — ABNORMAL HIGH (ref 0–22)

## 2023-03-17 LAB — C-REACTIVE PROTEIN: CRP: 13.5 mg/dL — ABNORMAL HIGH (ref <1.0)

## 2023-03-17 MED ORDER — FUROSEMIDE 40 MG PO TABS: 40.0000 mg | ORAL_TABLET | Freq: Every day | ORAL | Status: DC

## 2023-03-17 MED ORDER — FUROSEMIDE 20 MG PO TABS
20.0000 mg | ORAL_TABLET | Freq: Every day | ORAL | Status: DC
  Administered 2023-03-17 – 2023-03-19 (×3): 20 mg via ORAL
  Filled 2023-03-17 (×3): qty 1

## 2023-03-17 NOTE — Progress Notes (Signed)
[facility]                                                                              [name], is a 82 y.o. female, DOB - [birth year], RUE:[id number] Admit date - 03/13/2023    Outpatient Primary MD for the patient is Margo Aye, Kathleene Hazel, MD  LOS - 4  days  Chief Complaint  Patient presents with   Wound Check   Leg Pain       Brief summary   Patient is a 82 year old female with HTN, HLD, stroke, A-fib on Eliquis, chronic anemia, PAD with arterial wounds of bilateral lower extremity, breast CA status post left mastectomy and chemotherapy presented to ED with nonhealing wound of the right lower extremity. Patient follows with podiatry, Dr. Allena Katz and has been dealing with the ankle wound for about a year. On 11/5, patient was seen by vascular surgery, Dr. Chestine Spore On 11/19, she had arthrectomy, angioplasty of the right lower extremity arteries.  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  She has completed the course of antibiotics. On the morning of admission, she was seen by the home health nurse and noted to have worsening wound and was sent to ED.  Patient noted to have open right ankle ulcer on the lateral aspect with wide area of cellulitis around it. In ED, temp 100.6 F, HR 100, BP stable, on room air. CBC showed WBC 25.3, hemoglobin 11, platelets 338, lactic acid 2.2.  Creatinine 1.7. Podiatry was consulted, admitted for further workup.   Assessment & Plan    Principal Problem:   Sepsis (HCC), POA secondary to infected right ankle arterial ulcer with cellulitis -Met sepsis criteria with fever, tachycardia, leukocytosis, lactic acidosis, source secondary to right ankle ulcer -MRI right foot shows mild to moderate edema and soft tissue swelling of the medial greater than lateral aspect of the ankle and the dorsal lateral midfoot, acute osteomyelitis distal fibular diaphysis and metaphysis -Blood cultures negative so  far -Podiatry following, underwent I&D of ulceration to the subcutaneous fat level, right lateral ankle, bone biopsy application of dermal allograft on 03/15/23 -Wound cultures showing Streptococcus pyogenous, Corynebacterium -Bone biopsy results pending.  ESR 92, CRP 13.5 -ID following, placed on daptomycin and continue IV Rocephin  Active problems Peripheral artery disease  s/p recent angioplasty and stenting of multiple right lower extremity arteries - Dr. Myra Gianotti -D/w Dr Annamary Rummage, okay to resume Eliquis and Plavix, no further debridement or surgery planned  -Resumed Eliquis and Plavix   Chronic systolic CHF, Hypertension -Continue metoprolol -Will resume Lasix 20 mg daily (outpatient on 40 mg daily) -Recent echo 05/2022 with EF of 40 to 45%, global hypokinesis    Hyperlipidemia Continue statin    Permanent atrial fibrillation -Rate controlled, continue metoprolol, amiodarone -Eliquis resumed    Prolonged Qtc EKG on admission with QTc 529 ms -Avoid QTc prolonging meds, -Maintain K ~ 4, Mg >2  Chronic kidney disease stage 4 - Renal function is poor at baseline with most recent creatinine 1.7. -Creatinine improving, 1.4, close to baseline   breast cancer  s/p left mastectomy and chemo  Constipation -Continue Senokot, MiraLAX  Underweight, Estimated body mass index is 15.94 kg/m as calculated from the following:   Height as of this encounter: 5\' 3"  (1.6 m).   Weight as of this encounter: 40.8 kg.  Code Status: Full code DVT Prophylaxis:  apixaban (ELIQUIS) tablet 2.5 mg Start: 03/16/23 1745 apixaban (ELIQUIS) tablet 2.5 mg   Level of Care: Level of care: Telemetry Medical Family Communication: Updated patient Disposition Plan:      Remains inpatient appropriate: Follow wound cultures   Procedures:  1.  Irrigation and excisional debridement of ulceration to subcutaneous fat level with prep for graft, 6 x 3 x 0.5 cm, right lateral ankle 2.  Bone biopsy via  cannulated needle, distal fibula right ankle 3.  Application dermal allograft-Arthroflex -6.5 x 3.5 x 0.5 cm, right lateral ankle  Consultants:   Podiatry  Antimicrobials:   Anti-infectives (From admission, onward)    Start     Dose/Rate Route Frequency Ordered Stop   03/16/23 1600  DAPTOmycin (CUBICIN) 350 mg in sodium chloride 0.9 % IVPB        8 mg/kg  40.8 kg 114 mL/hr over 30 Minutes Intravenous Every 48 hours 03/16/23 1512     03/15/23 1300  vancomycin (VANCOREADY) IVPB 500 mg/100 mL  Status:  Discontinued        500 mg 100 mL/hr over 60 Minutes Intravenous Every 48 hours 03/13/23 1842 03/16/23 1501   03/15/23 1138  vancomycin (VANCOCIN) 1-5 GM/200ML-% IVPB       Note to Pharmacy: [name] E: cabinet override      03/15/23 1138 03/15/23 2344   03/14/23 1300  cefTRIAXone (ROCEPHIN) 2 g in sodium chloride 0.9 % 100 mL IVPB        2 g 200 mL/hr over 30 Minutes Intravenous Every 24 hours 03/13/23 1839     03/13/23 1245  vancomycin (VANCOCIN) IVPB 1000 mg/200 mL premix        1,000 mg 200 mL/hr over 60 Minutes Intravenous  Once 03/13/23 1241 03/13/23 1447   03/13/23 1245  cefTRIAXone (ROCEPHIN) 2 g in sodium chloride 0.9 % 100 mL IVPB        2 g 200 mL/hr over 30 Minutes Intravenous  Once 03/13/23 1241 03/13/23 1341          Medications  amiodarone  100 mg Oral QODAY   apixaban  2.5 mg Oral BID   Chlorhexidine Gluconate Cloth  6 each Topical Q0600   clopidogrel  75 mg Oral q morning   metoprolol tartrate  75 mg Oral BID   mupirocin ointment  1 Application Nasal BID   pantoprazole  40 mg Oral Daily   polyethylene glycol  17 g Oral Daily   pravastatin  40 mg Oral Daily   senna-docusate  1 tablet Oral BID   umeclidinium bromide  1 puff Inhalation Daily      Subjective:   [name] was seen and examined today.  No acute complaints, no fevers.  No nausea vomiting, chest pain or shortness of breath.    Objective:   Vitals:   03/17/23 0544 03/17/23 0755  03/17/23 0824 03/17/23 0846  BP: 130/79 126/77  126/77  Pulse: 89 84  84  Resp: 16 15    Temp: 98.3 F (36.8 C) 98.8 F (37.1 C)    TempSrc: Oral Oral    SpO2: 95% 95% 95%   Weight:      Height:        Intake/Output Summary (Last  24 hours) at 03/17/2023 1319 Last data filed at 03/17/2023 0800 Gross per 24 hour  Intake 840.98 ml  Output --  Net 840.98 ml     Wt Readings from Last 3 Encounters:  03/15/23 40.8 kg  03/04/23 41.3 kg  02/26/23 40.8 kg   Physical Exam General: Alert and oriented x 3, NAD Cardiovascular: S1 S2 clear, RRR.  Respiratory: CTAB Gastrointestinal: Soft, NT, ND, NBS Ext: no pedal edema bilaterally Neuro: no new deficits Skin: RLE dressing intact Psych: Normal affect   Data Reviewed:  I have personally reviewed following labs    CBC Lab Results  Component Value Date   WBC 10.3 03/17/2023   RBC 2.99 (L) 03/17/2023   HGB 9.6 (L) 03/17/2023   HCT 29.8 (L) 03/17/2023   MCV 99.7 03/17/2023   MCH 32.1 03/17/2023   PLT 306 03/17/2023   MCHC 32.2 03/17/2023   RDW 17.0 (H) 03/17/2023   LYMPHSABS 1.0 03/13/2023   MONOABS 0.8 03/13/2023   EOSABS 0.0 03/13/2023   BASOSABS 0.3 (H) 03/13/2023     Last metabolic panel Lab Results  Component Value Date   NA 137 03/17/2023   K 4.7 03/17/2023   CL 105 03/17/2023   CO2 21 (L) 03/17/2023   BUN 32 (H) 03/17/2023   CREATININE 1.47 (H) 03/17/2023   GLUCOSE 95 03/17/2023   GFRNONAA 35 (L) 03/17/2023   GFRAA >60 02/20/2015   CALCIUM 8.3 (L) 03/17/2023   PHOS 2.2 (L) 03/06/2022   PROT 7.2 03/13/2023   ALBUMIN 3.1 (L) 03/13/2023   BILITOT 0.8 03/13/2023   ALKPHOS 63 03/13/2023   AST 30 03/13/2023   ALT 23 03/13/2023   ANIONGAP 11 03/17/2023    CBG (last 3)  No results for input(s): "GLUCAP" in the last 72 hours.    Coagulation Profile: No results for input(s): "INR", "PROTIME" in the last 168 hours.   Radiology Studies: I have personally reviewed the imaging studies  No results  found.     Thad Ranger M.D. [facility] 03/17/2023, 1:19 PM  Available via Epic secure chat 7am-7pm After 7 pm, please refer to night coverage provider listed on amion.

## 2023-03-17 NOTE — Plan of Care (Signed)
  Problem: Activity: Goal: Risk for activity intolerance will decrease Outcome: Progressing   

## 2023-03-18 DIAGNOSIS — I2510 Atherosclerotic heart disease of native coronary artery without angina pectoris: Secondary | ICD-10-CM

## 2023-03-18 DIAGNOSIS — L98492 Non-pressure chronic ulcer of skin of other sites with fat layer exposed: Secondary | ICD-10-CM

## 2023-03-18 DIAGNOSIS — L089 Local infection of the skin and subcutaneous tissue, unspecified: Principal | ICD-10-CM

## 2023-03-18 DIAGNOSIS — L03115 Cellulitis of right lower limb: Secondary | ICD-10-CM | POA: Diagnosis not present

## 2023-03-18 DIAGNOSIS — I4819 Other persistent atrial fibrillation: Secondary | ICD-10-CM | POA: Diagnosis not present

## 2023-03-18 DIAGNOSIS — T148XXA Other injury of unspecified body region, initial encounter: Secondary | ICD-10-CM | POA: Diagnosis not present

## 2023-03-18 DIAGNOSIS — J449 Chronic obstructive pulmonary disease, unspecified: Secondary | ICD-10-CM | POA: Diagnosis not present

## 2023-03-18 LAB — CULTURE, BLOOD (ROUTINE X 2)
Culture: NO GROWTH
Culture: NO GROWTH

## 2023-03-18 LAB — VANCOMYCIN, PEAK: Vancomycin Pk: 10 ug/mL — ABNORMAL LOW (ref 30–40)

## 2023-03-18 LAB — SURGICAL PATHOLOGY

## 2023-03-18 MED ORDER — VANCOMYCIN HCL 500 MG/100ML IV SOLN
500.0000 mg | Freq: Once | INTRAVENOUS | Status: AC
  Administered 2023-03-18: 500 mg via INTRAVENOUS
  Filled 2023-03-18: qty 100

## 2023-03-18 MED ORDER — VANCOMYCIN HCL 500 MG/100ML IV SOLN
500.0000 mg | Freq: Once | INTRAVENOUS | Status: AC
  Administered 2023-03-19: 500 mg via INTRAVENOUS
  Filled 2023-03-18: qty 100

## 2023-03-18 NOTE — Plan of Care (Signed)

## 2023-03-18 NOTE — Care Management Important Message (Signed)
Important Message  Patient Details  Name: [name] MRN: [id number] Date of Birth: [birth year]   Important Message Given:  Yes - Medicare IM     Sherilyn Banker 03/18/2023, 4:15 PM

## 2023-03-18 NOTE — Progress Notes (Signed)
  Subjective:  Patient ID: [name], female    DOB: 1940/05/02,  MRN: [id number]  Chief Complaint  Patient presents with   Wound Check   Leg Pain    DOS: 03/15/23 Procedure: Right ankle ulceration debridement with prep for graft, bone biopsy with needle superficial, application dermal allograft.   82 y.o. female seen for postop appointment postop day #3 status post above procedures.  She says that the right ankle is feeling much better from prior.  Denies any nausea vomiting fever chills overall feeling well.  Wants to make sure that the antibiotics are working and that she will not have recurrent issues on the right side prior to discharge from the hospital  Review of Systems: Negative except as noted in the HPI. Denies N/V/F/Ch.   Objective:   Vitals:   03/18/23 1000 03/18/23 1309  BP: (!) 146/81 130/73  Pulse: 85 86  Resp:  17  Temp:  98 F (36.7 C)  SpO2:  91%   Body mass index is 15.94 kg/m. Constitutional Well developed. Well nourished.  Vascular Foot warm and well perfused. Capillary refill normal to all digits.  Calf is soft and supple, no posterior calf or knee pain, negative Homans' sign  Neurologic Normal speech. Oriented to person, place, and time. Epicritic sensation to light touch grossly present bilaterally.  Dermatologic Skin graft well adhered to the wound bed at the lateral ankle there is eschar overlying.  No evidence of residual infection there is decreased erythema about the wound much improved from preop.      Orthopedic: Tenderness to palpation noted about the surgical site.  Decreased edema right lower extremity   Pathology distal fibula: Pending  Wound cultures  RARE CORYNEBACTERIUM STRIATUM Standardized susceptibility testing for this organism is not available. RARE GROUP A STREP (S.PYOGENES) ISOLATED Beta hemolytic streptococci are predictably susceptible to penicillin and other beta lactams. Susceptibility testing not routinely  performed. NO ANAEROBES ISOLATED; CULTURE IN PROGRESS FOR 5 DAYS    Assessment:   Ulceration of right lateral ankle to level subcutaneous fat tissue  1. Wound infection   2. Cellulitis of right lower extremity     Plan:  Patient was evaluated and treated and all questions answered.  S/p right ankle ulcer debridement bone biopsy distal fibula and graft application -Progressing as expected post-operatively.  Decreased erythema and much reduced cellulitis in the right lower extremity -Pathology distal fibula bone biopsy pending -XR: Deferred -WB Status: Weightbearing as tolerated in postop shoe -Sutures: Staples to remain intact holding the graft in place. -Medications: Antibiotics per ID appreciate recommendations, continue to monitor micro and path results -Foot redressed.  Leave dressing clean dry and intact until follow-up in the office next week Tuesday, December 17 -patient aware to call to schedule -Patient stable for discharge from podiatry standpoint when cleared by infectious disease with antibiotic plan in place         Corinna Gab, DPM [facility] / [facility]

## 2023-03-18 NOTE — Progress Notes (Incomplete)
Pharmacy Antibiotic Note  [name] is a 82 y.o. female admitted on 03/13/2023 with R-ankle wound. Pharmacy has been consulted for Vancomycin dosing.  Restarting Vancomycin to cover for isolated pathogens on culture. Given the patient's petite size - will plan for two levels to help optimize dosing strategy for her.   Plan: - Vancomycin 500 mg IV x 1 dose now followed by levels for patient-specific dosing - No standing Vancomycin for now - Will continue to follow renal function, culture results, LOT, and antibiotic de-escalation plans   Height: 5\' 3"  (160 cm) Weight: 40.8 kg (90 lb) IBW/kg (Calculated) : 52.4  Temp (24hrs), Avg:97.8 F (36.6 C), Min:97.6 F (36.4 C), Max:98 F (36.7 C)  Recent Labs  Lab 03/13/23 1115 03/13/23 1145 03/13/23 1341 03/14/23 0458 03/15/23 0528 03/16/23 0401 03/17/23 0556  WBC 25.3*  --   --  17.1* 9.7 10.0 10.3  CREATININE 1.70*  --   --  1.62* 1.51* 1.43* 1.47*  LATICACIDVEN  --  2.2* 1.8  --   --   --   --     Estimated Creatinine Clearance: 18.3 mL/min (A) (by C-G formula based on SCr of 1.47 mg/dL (H)).    Allergies  Allergen Reactions   Iodine Rash and Other (See Comments)    BETADINE, blistering rash/burns   Iohexol Rash and Other (See Comments)    Blisters; PT NEEDS 13-HOUR PREP    Petroleum Gauze Non-Woven 3x9" [Wound Dressings] Other (See Comments)    BURNING SENSATION, RED SKIN   Sulfa Antibiotics Nausea And Vomiting   Wound Dressing Adhesive Other (See Comments)    Burns skin   Tape Itching, Rash and Other (See Comments)    "DO NOT USE ADHESIVE TAPE" Not even a Band Aid" NO PAPER TAPE Burns skin Skin is very very thin    Antimicrobials this admission: Vancomycin 12/4 >> 12/6; restart 12/9 CRO 12/4 >> 12/8 Daptomycin 12/7 x 1  Dose adjustments this admission:   Microbiology results: 12/6 ankle tissue >> rare GAS 12/6 ankle tissue << rare GAS + corynebacterium striatum  Thank you for allowing pharmacy to be a  part of this patient's care.  Georgina Pillion, PharmD, BCPS, BCIDP Infectious Diseases Clinical Pharmacist 03/18/2023 4:00 PM   **Pharmacist phone directory can now be found on [email or web address] ([email or web address]).  Listed under [facility].

## 2023-03-18 NOTE — Progress Notes (Signed)
[facility]  Date of Admission:  03/13/2023     Total days of antibiotics 6         ASSESSMENT:  [name] is POD #3 from right ankle debridement, bone biopsy and application of allograft in the setting of chronic right lateral ankle ulceration with cellulitis. Surgical cultures are growing Group A Streptococcus and Corynebacterium striatum. Will change daptomycin to vancomycin secondary to better coverage reliability with vancomycin. Discussed plan of care for 2 weeks of IV vancomycin followed by 2 weeks of linezolid with possible longer duration pending surgical pathology results. Therapeutic drug monitoring of renal function and vancomycin levels. Pharmacy to adjust vancomycin levels prior to discharge. Will need PICC line placement prior to discharge. Continue post-operative wound care per Podiatry with remaining medical and supportive care per Internal Medicine.   PLAN:  Change antibiotic to vancomycin Therapeutic drug monitoring of renal function and vancomycin levels.  PICC line prior to discharge. Pharmacy to dose vancomycin prior to discharge. Post-operative wound care per Podiatry. Remaining medical and supportive care per Internal Medicine.    Principal Problem:   Sepsis (HCC) Active Problems:   Persistent atrial fibrillation (HCC)   CAD in native artery   COPD, moderate (HCC)   PAD (peripheral artery disease) (HCC)   GERD (gastroesophageal reflux disease)   Cellulitis of right lower extremity   Skin ulcer of right ankle with fat layer exposed (HCC)   Wound infection    amiodarone  100 mg Oral QODAY   apixaban  2.5 mg Oral BID   Chlorhexidine Gluconate Cloth  6 each Topical Q0600   clopidogrel  75 mg Oral q morning   furosemide  20 mg Oral Daily   metoprolol tartrate  75 mg Oral BID   mupirocin ointment  1 Application Nasal BID   pantoprazole  40 mg Oral Daily   polyethylene glycol  17 g Oral Daily   pravastatin  40 mg Oral Daily    senna-docusate  1 tablet Oral BID   umeclidinium bromide  1 puff Inhalation Daily    SUBJECTIVE:  Afebrile overnight with no acute events. Husband at bedside.   Allergies  Allergen Reactions   Iodine Rash and Other (See Comments)    BETADINE, blistering rash/burns   Iohexol Rash and Other (See Comments)    Blisters; PT NEEDS 13-HOUR PREP    Petroleum Gauze Non-Woven 3x9" [Wound Dressings] Other (See Comments)    BURNING SENSATION, RED SKIN   Sulfa Antibiotics Nausea And Vomiting   Wound Dressing Adhesive Other (See Comments)    Burns skin   Tape Itching, Rash and Other (See Comments)    "DO NOT USE ADHESIVE TAPE" Not even a Band Aid" NO PAPER TAPE Burns skin Skin is very very thin     Review of Systems: Review of Systems  Constitutional:  Negative for chills, fever and weight loss.  Respiratory:  Negative for cough, shortness of breath and wheezing.   Cardiovascular:  Negative for chest pain and leg swelling.  Gastrointestinal:  Negative for abdominal pain, constipation, diarrhea, nausea and vomiting.  Skin:  Negative for rash.      OBJECTIVE: Vitals:   03/18/23 0833 03/18/23 1000 03/18/23 1000 03/18/23 1309  BP: 129/79 (!) 146/81 (!) 146/81 130/73  Pulse: 81 80 85 86  Resp: 17   17  Temp: 97.6 F (36.4 C)   98 F (36.7 C)  TempSrc:    Oral  SpO2: 92%   91%  Weight:  Height:       Body mass index is 15.94 kg/m.  Physical Exam Constitutional:      General: She is not in acute distress.    Appearance: She is well-developed.  Cardiovascular:     Rate and Rhythm: Normal rate and regular rhythm.     Heart sounds: Normal heart sounds.  Pulmonary:     Effort: Pulmonary effort is normal.     Breath sounds: Normal breath sounds.  Musculoskeletal:     Comments: Surgical dressing in place.   Skin:    General: Skin is warm and dry.  Neurological:     Mental Status: She is alert and oriented to person, place, and time.     Lab Results Lab Results   Component Value Date   WBC 10.3 03/17/2023   HGB 9.6 (L) 03/17/2023   HCT 29.8 (L) 03/17/2023   MCV 99.7 03/17/2023   PLT 306 03/17/2023    Lab Results  Component Value Date   CREATININE 1.47 (H) 03/17/2023   BUN 32 (H) 03/17/2023   NA 137 03/17/2023   K 4.7 03/17/2023   CL 105 03/17/2023   CO2 21 (L) 03/17/2023    Lab Results  Component Value Date   ALT 23 03/13/2023   AST 30 03/13/2023   ALKPHOS 63 03/13/2023   BILITOT 0.8 03/13/2023     Microbiology: Recent Results (from the past 240 hour(s))  Blood culture (routine x 2)     Status: None   Collection Time: 03/13/23 11:32 AM   Specimen: BLOOD  Result Value Ref Range Status   Specimen Description BLOOD RIGHT ANTECUBITAL  Final   Special Requests   Final    BOTTLES DRAWN AEROBIC AND ANAEROBIC Blood Culture results may not be optimal due to an inadequate volume of blood received in culture bottles   Culture   Final    NO GROWTH 5 DAYS Performed at [facility], [address], [place], [place] [address]    Report Status 03/18/2023 FINAL  Final  Blood culture (routine x 2)     Status: None   Collection Time: 03/13/23 11:37 AM   Specimen: BLOOD  Result Value Ref Range Status   Specimen Description BLOOD LEFT ANTECUBITAL  Final   Special Requests BOTTLES DRAWN AEROBIC ONLY BACTEROIDES CACCAE  Final   Culture   Final    NO GROWTH 5 DAYS Performed at [facility], [address], [place], [place] [address]    Report Status 03/18/2023 FINAL  Final  Surgical pcr screen     Status: Abnormal   Collection Time: 03/14/23  3:09 PM   Specimen: Nasal Mucosa; Nasal Swab  Result Value Ref Range Status   MRSA, PCR POSITIVE (A) NEGATIVE Final    Comment: RESULT CALLED TO, READ BACK BY AND VERIFIED WITH: RN JUN GIRAL ON 03/14/23 @ 1800 BY DRT    Staphylococcus aureus POSITIVE (A) NEGATIVE Final    Comment: (NOTE) The Xpert SA Assay (FDA approved for NASAL specimens in patients 16 years of age and older), is one  component of a comprehensive surveillance program. It is not intended to diagnose infection nor to guide or monitor treatment. Performed at [facility], [address] [place], [place] [address]   Aerobic/Anaerobic Culture w Gram Stain (surgical/deep wound)     Status: None (Preliminary result)   Collection Time: 03/15/23 12:22 PM   Specimen: Soft Tissue, Other  Result Value Ref Range Status   Specimen  Description TISSUE  Final   Special Requests RIGHT LATERAL ANKLE  Final   Gram Stain   Final    RARE WBC PRESENT, PREDOMINANTLY PMN NO ORGANISMS SEEN Performed at [facility], [address] [place], [place] [address]    Culture   Final    RARE GROUP A STREP (S.PYOGENES) ISOLATED Beta hemolytic streptococci are predictably susceptible to penicillin and other beta lactams. Susceptibility testing not routinely performed. NO ANAEROBES ISOLATED; CULTURE IN PROGRESS FOR 5 DAYS    Report Status PENDING  Incomplete  Aerobic/Anaerobic Culture w Gram Stain (surgical/deep wound)     Status: None (Preliminary result)   Collection Time: 03/15/23 12:32 PM   Specimen: Soft Tissue, Other  Result Value Ref Range Status   Specimen Description WOUND  Final   Special Requests SWAB OF LEFT ANKLE TISSUE POST LAVAGE  Final   Gram Stain   Final    NO WBC SEEN RARE GRAM POSITIVE COCCI IN PAIRS Performed at [facility], [address] [place], [place] [address]    Culture   Final    RARE CORYNEBACTERIUM STRIATUM Standardized susceptibility testing for this organism is not available. RARE GROUP A STREP (S.PYOGENES) ISOLATED Beta hemolytic streptococci are predictably susceptible to penicillin and other beta lactams. Susceptibility testing not routinely performed. NO ANAEROBES ISOLATED; CULTURE IN PROGRESS FOR 5 DAYS    Report Status PENDING  Incomplete     Marcos Eke, NP [facility]  03/18/2023  3:41 PM

## 2023-03-18 NOTE — Plan of Care (Signed)

## 2023-03-18 NOTE — Progress Notes (Signed)
Pharmacy Antibiotic Note  [name] is a 82 y.o. female admitted on 03/13/2023 with R-ankle wound. Pharmacy has been consulted for Vancomycin dosing.  Restarting Vancomycin to cover for isolated pathogens on culture. Given the patient's petite size - will plan for two levels to help optimize dosing strategy for her.   Plan: - Vancomycin 500 mg IV x 1 dose now followed by levels for patient-specific dosing - No standing Vancomycin for now - Will continue to follow renal function, culture results, LOT, and antibiotic de-escalation plans   Height: 5\' 3"  (160 cm) Weight: 40.8 kg (90 lb) IBW/kg (Calculated) : 52.4  Temp (24hrs), Avg:97.8 F (36.6 C), Min:97.6 F (36.4 C), Max:98 F (36.7 C)  Recent Labs  Lab 03/13/23 1115 03/13/23 1145 03/13/23 1341 03/14/23 0458 03/15/23 0528 03/16/23 0401 03/17/23 0556  WBC 25.3*  --   --  17.1* 9.7 10.0 10.3  CREATININE 1.70*  --   --  1.62* 1.51* 1.43* 1.47*  LATICACIDVEN  --  2.2* 1.8  --   --   --   --     Estimated Creatinine Clearance: 18.3 mL/min (A) (by C-G formula based on SCr of 1.47 mg/dL (H)).    Allergies  Allergen Reactions   Iodine Rash and Other (See Comments)    BETADINE, blistering rash/burns   Iohexol Rash and Other (See Comments)    Blisters; PT NEEDS 13-HOUR PREP    Petroleum Gauze Non-Woven 3x9" [Wound Dressings] Other (See Comments)    BURNING SENSATION, RED SKIN   Sulfa Antibiotics Nausea And Vomiting   Wound Dressing Adhesive Other (See Comments)    Burns skin   Tape Itching, Rash and Other (See Comments)    "DO NOT USE ADHESIVE TAPE" Not even a Band Aid" NO PAPER TAPE Burns skin Skin is very very thin    Antimicrobials this admission: Vancomycin 12/4 >> 12/6; restart 12/9 CRO 12/4 >> 12/8 Daptomycin 12/7 x 1  Dose adjustments this admission:   Microbiology results: 12/6 ankle tissue >> rare GAS 12/6 ankle tissue << rare GAS + corynebacterium striatum  Thank you for allowing pharmacy to be a  part of this patient's care.  Georgina Pillion, PharmD, BCPS, BCIDP Infectious Diseases Clinical Pharmacist 03/18/2023 3:44 PM   **Pharmacist phone directory can now be found on [email or web address] ([email or web address]).  Listed under [facility].

## 2023-03-18 NOTE — Progress Notes (Signed)
[facility]                                                                              [name], is a 82 y.o. female, DOB - [birth year], WUJ:[id number] Admit date - 03/13/2023    Outpatient Primary MD for the patient is Margo Aye, Kathleene Hazel, MD  LOS - 5  days  Chief Complaint  Patient presents with   Wound Check   Leg Pain       Brief summary   Patient is a 82 year old female with HTN, HLD, stroke, A-fib on Eliquis, chronic anemia, PAD with arterial wounds of bilateral lower extremity, breast CA status post left mastectomy and chemotherapy presented to ED with nonhealing wound of the right lower extremity. Patient follows with podiatry, Dr. Allena Katz and has been dealing with the ankle wound for about a year. On 11/5, patient was seen by vascular surgery, Dr. Chestine Spore On 11/19, she had arthrectomy, angioplasty of the right lower extremity arteries.  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  She has completed the course of antibiotics. On the morning of admission, she was seen by the home health nurse and noted to have worsening wound and was sent to ED.  Patient noted to have open right ankle ulcer on the lateral aspect with wide area of cellulitis around it. In ED, temp 100.6 F, HR 100, BP stable, on room air. CBC showed WBC 25.3, hemoglobin 11, platelets 338, lactic acid 2.2.  Creatinine 1.7. Podiatry was consulted, admitted for further workup.   Assessment & Plan    Principal Problem:   Sepsis (HCC), POA secondary to infected right ankle arterial ulcer with cellulitis -Met sepsis criteria with fever, tachycardia, leukocytosis, lactic acidosis, source secondary to right ankle ulcer -MRI right foot shows mild to moderate edema and soft tissue swelling of the medial greater than lateral aspect of the ankle and the dorsal lateral midfoot, acute osteomyelitis distal fibular diaphysis and metaphysis -Blood cultures negative so  far -Podiatry following, underwent I&D of ulceration to the subcutaneous fat level, right lateral ankle, bone biopsy application of dermal allograft on 03/15/23 -Wound cultures showing Streptococcus pyogenous, Corynebacterium -Bone biopsy results pending.  ESR 92, CRP 13.5 -ID following, placed on daptomycin and continue IV Rocephin  Active problems Peripheral artery disease  s/p recent angioplasty and stenting of multiple right lower extremity arteries - Dr. Myra Gianotti -Resumed Eliquis and Plavix   Chronic systolic CHF, Hypertension -Continue metoprolol -Will resume Lasix 20 mg daily (outpatient on 40 mg daily) -Recent echo 05/2022 with EF of 40 to 45%, global hypokinesis    Hyperlipidemia Continue statin    Permanent atrial fibrillation -Rate controlled, continue metoprolol, amiodarone -Eliquis resumed    Prolonged Qtc EKG on admission with QTc 529 ms -Avoid QTc prolonging meds, -Maintain K ~ 4, Mg >2  Chronic kidney disease stage 4 - Renal function is poor at baseline with most recent creatinine 1.7. -Cr 1.4, close to baseline   breast cancer  s/p left mastectomy and chemo  Constipation -Continue Senokot, MiraLAX  Underweight, Estimated body mass index is 15.94 kg/m as calculated from  the following:   Height as of this encounter: 5\' 3"  (1.6 m).   Weight as of this encounter: 40.8 kg.  Code Status: Full code DVT Prophylaxis:  apixaban (ELIQUIS) tablet 2.5 mg Start: 03/16/23 1745 apixaban (ELIQUIS) tablet 2.5 mg   Level of Care: Level of care: Telemetry Medical Family Communication: Updated patient Disposition Plan:      Remains inpatient appropriate: Pending PT OT evaluation, ID antibiotics recommendations for disposition   Procedures:  1.  Irrigation and excisional debridement of ulceration to subcutaneous fat level with prep for graft, 6 x 3 x 0.5 cm, right lateral ankle 2.  Bone biopsy via cannulated needle, distal fibula right ankle 3.  Application dermal  allograft-Arthroflex -6.5 x 3.5 x 0.5 cm, right lateral ankle  Consultants:   Podiatry  Antimicrobials:   Anti-infectives (From admission, onward)    Start     Dose/Rate Route Frequency Ordered Stop   03/18/23 1315  vancomycin (VANCOREADY) IVPB 500 mg/100 mL        500 mg 100 mL/hr over 60 Minutes Intravenous  Once 03/18/23 1218     03/16/23 1600  DAPTOmycin (CUBICIN) 350 mg in sodium chloride 0.9 % IVPB  Status:  Discontinued        8 mg/kg  40.8 kg 114 mL/hr over 30 Minutes Intravenous Every 48 hours 03/16/23 1512 03/18/23 1209   03/15/23 1300  vancomycin (VANCOREADY) IVPB 500 mg/100 mL  Status:  Discontinued        500 mg 100 mL/hr over 60 Minutes Intravenous Every 48 hours 03/13/23 1842 03/16/23 1501   03/15/23 1138  vancomycin (VANCOCIN) 1-5 GM/200ML-% IVPB       Note to Pharmacy: [name] E: cabinet override      03/15/23 1138 03/15/23 2344   03/14/23 1300  cefTRIAXone (ROCEPHIN) 2 g in sodium chloride 0.9 % 100 mL IVPB  Status:  Discontinued        2 g 200 mL/hr over 30 Minutes Intravenous Every 24 hours 03/13/23 1839 03/18/23 1209   03/13/23 1245  vancomycin (VANCOCIN) IVPB 1000 mg/200 mL premix        1,000 mg 200 mL/hr over 60 Minutes Intravenous  Once 03/13/23 1241 03/13/23 1447   03/13/23 1245  cefTRIAXone (ROCEPHIN) 2 g in sodium chloride 0.9 % 100 mL IVPB        2 g 200 mL/hr over 30 Minutes Intravenous  Once 03/13/23 1241 03/13/23 1341          Medications  amiodarone  100 mg Oral QODAY   apixaban  2.5 mg Oral BID   Chlorhexidine Gluconate Cloth  6 each Topical Q0600   clopidogrel  75 mg Oral q morning   furosemide  20 mg Oral Daily   metoprolol tartrate  75 mg Oral BID   mupirocin ointment  1 Application Nasal BID   pantoprazole  40 mg Oral Daily   polyethylene glycol  17 g Oral Daily   pravastatin  40 mg Oral Daily   senna-docusate  1 tablet Oral BID   umeclidinium bromide  1 puff Inhalation Daily      Subjective:   [name] was seen  and examined today.  No acute complaints.  No nausea vomiting, chest pain or shortness of breath.    Objective:   Vitals:   03/18/23 0833 03/18/23 1000 03/18/23 1000 03/18/23 1309  BP: 129/79 (!) 146/81 (!) 146/81 130/73  Pulse: 81 80 85 86  Resp: 17   17  Temp: 97.6 F (  36.4 C)   98 F (36.7 C)  TempSrc:    Oral  SpO2: 92%   91%  Weight:      Height:        Intake/Output Summary (Last 24 hours) at 03/18/2023 1335 Last data filed at 03/17/2023 1700 Gross per 24 hour  Intake 240 ml  Output --  Net 240 ml     Wt Readings from Last 3 Encounters:  03/15/23 40.8 kg  03/04/23 41.3 kg  02/26/23 40.8 kg   Physical Exam General: Alert and oriented x 3, NAD Cardiovascular: S1 S2 clear, RRR.  Respiratory: CTAB Gastrointestinal: Soft, nontender, nondistended, NBS Ext: no pedal edema bilaterally Neuro: no new deficits Skin: Dressing intact RLE Psych: Normal affect, pleasant  Data Reviewed:  I have personally reviewed following labs    CBC Lab Results  Component Value Date   WBC 10.3 03/17/2023   RBC 2.99 (L) 03/17/2023   HGB 9.6 (L) 03/17/2023   HCT 29.8 (L) 03/17/2023   MCV 99.7 03/17/2023   MCH 32.1 03/17/2023   PLT 306 03/17/2023   MCHC 32.2 03/17/2023   RDW 17.0 (H) 03/17/2023   LYMPHSABS 1.0 03/13/2023   MONOABS 0.8 03/13/2023   EOSABS 0.0 03/13/2023   BASOSABS 0.3 (H) 03/13/2023     Last metabolic panel Lab Results  Component Value Date   NA 137 03/17/2023   K 4.7 03/17/2023   CL 105 03/17/2023   CO2 21 (L) 03/17/2023   BUN 32 (H) 03/17/2023   CREATININE 1.47 (H) 03/17/2023   GLUCOSE 95 03/17/2023   GFRNONAA 35 (L) 03/17/2023   GFRAA >60 02/20/2015   CALCIUM 8.3 (L) 03/17/2023   PHOS 2.2 (L) 03/06/2022   PROT 7.2 03/13/2023   ALBUMIN 3.1 (L) 03/13/2023   BILITOT 0.8 03/13/2023   ALKPHOS 63 03/13/2023   AST 30 03/13/2023   ALT 23 03/13/2023   ANIONGAP 11 03/17/2023    CBG (last 3)  No results for input(s): "GLUCAP" in the last 72 hours.     Coagulation Profile: No results for input(s): "INR", "PROTIME" in the last 168 hours.   Radiology Studies: I have personally reviewed the imaging studies  No results found.     Thad Ranger M.D. [facility] 03/18/2023, 1:35 PM  Available via Epic secure chat 7am-7pm After 7 pm, please refer to night coverage provider listed on amion.

## 2023-03-18 NOTE — Anesthesia Postprocedure Evaluation (Signed)
Anesthesia Post Note  Patient: FANTAZIA CLURE  Procedure(s) Performed: IRRIGATION AND DEBRIDEMENT WOUND (Right: Ankle)     Patient location during evaluation: PACU Anesthesia Type: MAC Level of consciousness: awake Pain management: pain level controlled Vital Signs Assessment: post-procedure vital signs reviewed and stable Respiratory status: spontaneous breathing, nonlabored ventilation and respiratory function stable Cardiovascular status: blood pressure returned to baseline and stable Postop Assessment: no apparent nausea or vomiting Anesthetic complications: no   No notable events documented.  Last Vitals:  Vitals:   03/18/23 1000 03/18/23 1000  BP: (!) 146/81 (!) 146/81  Pulse: 80 85  Resp:    Temp:    SpO2:      Last Pain:  Vitals:   03/17/23 2213  TempSrc:   PainSc: 0-No pain                 Kaedynce Tapp P Payeton Germani

## 2023-03-19 ENCOUNTER — Encounter (HOSPITAL_COMMUNITY): Payer: Self-pay | Admitting: Podiatry

## 2023-03-19 ENCOUNTER — Other Ambulatory Visit: Payer: Self-pay

## 2023-03-19 DIAGNOSIS — T148XXA Other injury of unspecified body region, initial encounter: Secondary | ICD-10-CM | POA: Diagnosis not present

## 2023-03-19 DIAGNOSIS — L03115 Cellulitis of right lower limb: Secondary | ICD-10-CM | POA: Diagnosis not present

## 2023-03-19 DIAGNOSIS — J449 Chronic obstructive pulmonary disease, unspecified: Secondary | ICD-10-CM | POA: Diagnosis not present

## 2023-03-19 DIAGNOSIS — I2510 Atherosclerotic heart disease of native coronary artery without angina pectoris: Secondary | ICD-10-CM | POA: Diagnosis not present

## 2023-03-19 LAB — BASIC METABOLIC PANEL
Anion gap: 8 (ref 5–15)
BUN: 30 mg/dL — ABNORMAL HIGH (ref 8–23)
CO2: 24 mmol/L (ref 22–32)
Calcium: 8.4 mg/dL — ABNORMAL LOW (ref 8.9–10.3)
Chloride: 104 mmol/L (ref 98–111)
Creatinine, Ser: 1.39 mg/dL — ABNORMAL HIGH (ref 0.44–1.00)
GFR, Estimated: 37 mL/min — ABNORMAL LOW (ref >60)
Glucose, Bld: 101 mg/dL — ABNORMAL HIGH (ref 70–99)
Potassium: 4.4 mmol/L (ref 3.5–5.1)
Sodium: 136 mmol/L (ref 135–145)

## 2023-03-19 LAB — CBC
HCT: 30.5 % — ABNORMAL LOW (ref 36.0–46.0)
Hemoglobin: 9.8 g/dL — ABNORMAL LOW (ref 12.0–15.0)
MCH: 32.1 pg (ref 26.0–34.0)
MCHC: 32.1 g/dL (ref 30.0–36.0)
MCV: 100 fL (ref 80.0–100.0)
Platelets: 342 10*3/uL (ref 150–400)
RBC: 3.05 MIL/uL — ABNORMAL LOW (ref 3.87–5.11)
RDW: 17 % — ABNORMAL HIGH (ref 11.5–15.5)
WBC: 12 10*3/uL — ABNORMAL HIGH (ref 4.0–10.5)
nRBC: 0.7 % — ABNORMAL HIGH (ref 0.0–0.2)

## 2023-03-19 MED ORDER — FUROSEMIDE 40 MG PO TABS
40.0000 mg | ORAL_TABLET | Freq: Every day | ORAL | Status: DC
  Administered 2023-03-20: 40 mg via ORAL
  Filled 2023-03-19: qty 1

## 2023-03-19 MED ORDER — LACTULOSE 10 GM/15ML PO SOLN
30.0000 g | Freq: Once | ORAL | Status: AC
  Administered 2023-03-19: 30 g via ORAL
  Filled 2023-03-19: qty 45

## 2023-03-19 MED ORDER — VANCOMYCIN VARIABLE DOSE PER UNSTABLE RENAL FUNCTION (PHARMACIST DOSING): Status: DC

## 2023-03-19 MED ORDER — VANCOMYCIN HCL IN DEXTROSE 1-5 GM/200ML-% IV SOLN
1000.0000 mg | Freq: Once | INTRAVENOUS | Status: AC
  Administered 2023-03-19: 1000 mg via INTRAVENOUS
  Filled 2023-03-19: qty 200

## 2023-03-19 NOTE — Progress Notes (Signed)
Pharmacy Antibiotic Note  [name] is a 82 y.o. female admitted on 03/13/2023 with R-ankle wound. Pharmacy has been consulted for Vancomycin dosing.  The patient had a Vancomycin peak drawn 4 hours after the dose yesterday evening which was low at 10 mcg/ml (typical peak 25-35 mcg/ml). A repeat dose of 500 mg x 1 was given this AM.   Will give an additional 1g dose this evening and check two levels for PK estimations to determine what dosing she will need.   Plan: - Vancomycin 1g IV x 1 dose at 1800 tonight, VP at 2100, VR with AM labs on 12/11 - No standing Vancomycin for now - Will continue to follow renal function, culture results, LOT, and antibiotic de-escalation plans   Height: 5\' 3"  (160 cm) Weight: 40.8 kg (90 lb) IBW/kg (Calculated) : 52.4  Temp (24hrs), Avg:98 F (36.7 C), Min:97.6 F (36.4 C), Max:98.3 F (36.8 C)  Recent Labs  Lab 03/13/23 1145 03/13/23 1341 03/14/23 0458 03/15/23 0528 03/16/23 0401 03/17/23 0556 03/18/23 2031 03/19/23 0533  WBC  --   --  17.1* 9.7 10.0 10.3  --  12.0*  CREATININE  --   --  1.62* 1.51* 1.43* 1.47*  --  1.39*  LATICACIDVEN 2.2* 1.8  --   --   --   --   --   --   VANCOPEAK  --   --   --   --   --   --  10*  --     Estimated Creatinine Clearance: 19.4 mL/min (A) (by C-G formula based on SCr of 1.39 mg/dL (H)).    Allergies  Allergen Reactions   Iodine Rash and Other (See Comments)    BETADINE, blistering rash/burns   Iohexol Rash and Other (See Comments)    Blisters; PT NEEDS 13-HOUR PREP    Petroleum Gauze Non-Woven 3x9" [Wound Dressings] Other (See Comments)    BURNING SENSATION, RED SKIN   Sulfa Antibiotics Nausea And Vomiting   Wound Dressing Adhesive Other (See Comments)    Burns skin   Tape Itching, Rash and Other (See Comments)    "DO NOT USE ADHESIVE TAPE" Not even a Band Aid" NO PAPER TAPE Burns skin Skin is very very thin    Antimicrobials this admission: Vancomycin 12/4 >> 12/6; restart 12/9 CRO 12/4  >> 12/8 Daptomycin 12/7 x 1  Dose adjustments this admission:   Microbiology results: 12/6 ankle tissue >> rare GAS 12/6 ankle tissue << rare GAS + corynebacterium striatum  Thank you for allowing pharmacy to be a part of this patient's care.  Georgina Pillion, PharmD, BCPS, BCIDP Infectious Diseases Clinical Pharmacist 03/19/2023 8:23 AM   **Pharmacist phone directory can now be found on [email or web address] ([email or web address]).  Listed under [facility].

## 2023-03-19 NOTE — TOC Initial Note (Signed)
Transition of Care [facility]) - Initial/Assessment Note    Patient Details  Name: [name] MRN: [id number] Date of Birth: 09-Sep-1940  [facility]) CM/SW Contact:    Epifanio Lesches, RN Phone Number: 03/19/2023, 11:45 AM  Clinical Narrative:    Presents with chronic right ankle ulceration with cellulitis.     - s/p R ankle debridement, bone bx and application of allograft.  Per ID cultures are growing Group A Streptococcus and Corynebacterium striatum. Plan for 2 weeks of IV  ABX therapy. Pt agreeable to home health services. PTA active with [facility] and would like to continues with their services. Marylene Land  (admission liaison [phone]) with [facility] made aware. Resumption orders will be needed by MD for home health services. Referral made with Pam/ [facility]  [phone]) for IV ABX therapy, acceptance               From home with husband. Pt states once home husband to assist with care.  TOC team following for needs....  Expected Discharge Plan: Home w Home Health Services     Patient Goals and CMS Choice     Choice offered to / list presented to : Patient      Expected Discharge Plan and Services   Discharge Planning Services: CM Consult   Living arrangements for the past 2 months: Single Family Home                 DME Arranged: Other see comment DME Agency: Other - Comment ([facility]) Date DME Agency Contacted: 03/19/23 Time DME Agency Contacted: 1142 Representative spoke with at DME Agency: Pam HH Arranged: RN HH Agency: [facility]) Date HH Agency Contacted: 03/19/23 Time HH Agency Contacted: 1142 Representative spoke with at [facility] Agency: Marylene Land  Prior Living Arrangements/Services Living arrangements for the past 2 months: Single Family Home   Patient language and need for interpreter reviewed:: Yes Do you feel safe going back to the place where you live?: Yes      Need for Family  Participation in Patient Care: Yes (Comment) Care giver support system in place?: Yes (comment) Current home services: DME, Home RN (RW, transport chair) Criminal Activity/Legal Involvement Pertinent to Current Situation/Hospitalization: Yes - Comment as needed  Activities of Daily Living   ADL Screening (condition at time of admission) Independently performs ADLs?: Yes (appropriate for developmental age) Is the patient deaf or have difficulty hearing?: No Does the patient have difficulty seeing, even when wearing glasses/contacts?: No Does the patient have difficulty concentrating, remembering, or making decisions?: No  Permission Sought/Granted                  Emotional Assessment       Orientation: : Oriented to Self, Oriented to Place, Oriented to  Time, Oriented to Situation Alcohol / Substance Use: Not Applicable Psych Involvement: No (comment)  Admission diagnosis:  Wound infection [T14.8XXA, L08.9] Cellulitis of right lower extremity [L03.115] Sepsis (HCC) [A41.9] Patient Active Problem List   Diagnosis Date Noted   Wound infection 03/18/2023   Cellulitis of right lower extremity 03/14/2023   Skin ulcer of right ankle with fat layer exposed (HCC) 03/14/2023   Sepsis (HCC) 03/13/2023   CHF, acute on chronic (HCC) 11/17/2022   CKD stage 3b, GFR 30-44 ml/min (HCC) 11/17/2022   D-dimer, elevated 11/17/2022   Acute respiratory failure with hypoxia (HCC) 06/18/2022   Aspiration pneumonitis (HCC) 06/18/2022   Acute on chronic HFrEF (  heart failure with reduced ejection fraction) (HCC) 06/18/2022   Hypercoagulable state due to persistent atrial fibrillation (HCC) 06/11/2022   Acute heart failure with preserved ejection fraction (HFpEF) (HCC) 05/24/2022   Cellulitis and abscess of toe of left foot 03/07/2022   GERD (gastroesophageal reflux disease) 03/06/2022   Atypical atrial flutter (HCC)    Sepsis due to undetermined organism (HCC) 12/25/2021   Diarrhea 12/25/2021    Hyperkalemia 12/25/2021   Osteomyelitis of great toe of left foot (HCC) 12/25/2021   PAD (peripheral artery disease) (HCC) 12/19/2021   Osteoporosis    Tibial plateau fracture, left approx. 05/10/17 06/27/2017   COPD, moderate (HCC) 07/12/2015   Multiple gastric ulcers    Coagulopathy (HCC)    Bleeding gastrointestinal    Acute GI bleeding    CAD in native artery    Supratherapeutic INR    Right lower lobe lung mass    Cavitary lesion of lung    AKI (acute kidney injury) (HCC)    Chronic back pain    GI bleed 02/14/2015   Carotid artery disease (HCC) 02/14/2015   Leukocytosis 02/14/2015   Lung nodules 09/29/2014   Smoking history 09/29/2014   Pneumonia with cavity of lung 07/09/2014   Lung mass 07/09/2014   Thrombocytosis (HCC) 05/21/2014   Protein-calorie malnutrition, moderate (HCC) 05/21/2014   Anemia    Generalized weakness    Necrotic pneumonia (HCC) 05/20/2014   Pneumonia 05/20/2014   Elevated liver enzymes 12/16/2013   Tremor 06/24/2013   Encounter for therapeutic drug monitoring 05/18/2013   Dizziness 01/12/2013   Wound of ankle 10/23/2010   Long term current use of anticoagulant 07/05/2010   Carotid bruit 11/01/2009   Hyperlipidemia 10/26/2009   Essential hypertension 10/26/2009   Persistent atrial fibrillation (HCC) 10/26/2009   ACUTE ON CHRONIC DIASTOLIC HEART FAILURE 10/26/2009   SYNCOPE AND COLLAPSE 10/26/2009   PCP:  Benita Stabile, MD Pharmacy:   [facility] [place], [place] - [address] [place] [address] Phone: [phone] Fax: [phone]  [facility] [place], [place] - [address] [facility] AT Portal to Registered [facility] [address] [place] [address] Phone: [phone] Fax: [phone]     Social Determinants of Health (SDOH) Social History: SDOH Screenings   Food Insecurity: No Food Insecurity (11/17/2022)  Housing: Patient Declined (11/17/2022)   Transportation Needs: No Transportation Needs (11/17/2022)  Utilities: Not At Risk (11/17/2022)  Social Connections: Unknown (08/29/2022)   Received from [facility]  Tobacco Use: Medium Risk (03/15/2023)   SDOH Interventions:     Readmission Risk Interventions    06/18/2022    1:55 PM 03/07/2022    8:09 AM  Readmission Risk Prevention Plan  Transportation Screening Complete Complete  HRI or Home Care Consult Complete Complete  Social Work Consult for Recovery Care Planning/Counseling Complete Complete  Palliative Care Screening Not Applicable Not Applicable  Medication Review Oceanographer) Complete Complete

## 2023-03-19 NOTE — Evaluation (Signed)
Occupational Therapy Evaluation Patient Details Name: [name] MRN: [id number] DOB: 08-27-1940 Today's Date: 03/19/2023   History of Present Illness Pt is a 82 y/o female presenting on 12/4 with non healing wound of R LE. Admitted with sepsis from infected R ankle with cellulitis. S/P I&D 12/6, WBAT R LE in post op shoe. PMH includes: anemia, afib, breast cancer s/p mastectomy and chemo, HTN, meningioma, pneumonia, CVA.   Clinical Impression   PTA patient independent with ADLs, ambulates pushing transport chair short distances.  Admitted for above and presents with problem list below.  Today, she requires min assist for transfers, up to min assist for Adls and min guard to min assist for mobility using RW.  Pt requires cueing for safety and RW mgmt, but typically pushes w/c.  Spouse is present and is able to assist at home as needed. Based on performance today, recommend continued OT services acutely and after dc at [facility] level to optimize independence, safety and return to PLOF. Will follow acutely.       If plan is discharge home, recommend the following: A little help with walking and/or transfers;A little help with bathing/dressing/bathroom;Assistance with cooking/housework;Assist for transportation;Help with stairs or ramp for entrance    Functional Status Assessment  Patient has had a recent decline in their functional status and demonstrates the ability to make significant improvements in function in a reasonable and predictable amount of time.  Equipment Recommendations  None recommended by OT    Recommendations for Other Services       Precautions / Restrictions Precautions Precautions: Fall Required Braces or Orthoses: Other Brace Other Brace: post op shoe R LE Restrictions Weight Bearing Restrictions: Yes RLE Weight Bearing: Weight bearing as tolerated      Mobility Bed Mobility Overal bed mobility: Modified Independent             General bed mobility  comments: HOB elevated but no assist    Transfers Overall transfer level: Needs assistance Equipment used: Rolling walker (2 wheels) Transfers: Sit to/from Stand Sit to Stand: Min assist, Contact guard assist           General transfer comment: min guard from EOB but min assist from commode, cueing for hand placement and RW safety      Balance Overall balance assessment: Needs assistance Sitting-balance support: No upper extremity supported, Feet supported Sitting balance-Leahy Scale: Good     Standing balance support: Bilateral upper extremity supported, During functional activity Standing balance-Leahy Scale: Poor Standing balance comment: relies on BUE support dyamically                           ADL either performed or assessed with clinical judgement   ADL Overall ADL's : Needs assistance/impaired     Grooming: Contact guard assist;Standing           Upper Body Dressing : Set up;Sitting   Lower Body Dressing: Minimal assistance;Sit to/from stand   Toilet Transfer: Minimal assistance;Ambulation;Rolling walker (2 wheels);Grab bars;Regular Toilet   Toileting- Clothing Manipulation and Hygiene: Minimal assistance;Sit to/from stand       Functional mobility during ADLs: Minimal assistance;Rolling walker (2 wheels);Cueing for sequencing;Cueing for safety General ADL Comments: continuous cues for RW mgmt and safety     Vision   Vision Assessment?: No apparent visual deficits     Perception         Praxis         Pertinent Vitals/Pain Pain  Assessment Pain Assessment: Faces Faces Pain Scale: No hurt Pain Intervention(s): Monitored during session     Extremity/Trunk Assessment Upper Extremity Assessment Upper Extremity Assessment: Overall WFL for tasks assessed   Lower Extremity Assessment Lower Extremity Assessment: Defer to PT evaluation       Communication Communication Communication: No apparent difficulties   Cognition  Arousal: Alert Behavior During Therapy: WFL for tasks assessed/performed Overall Cognitive Status: Within Functional Limits for tasks assessed                                       General Comments  spouse at side and supportive    Exercises     Shoulder Instructions      Home Living Family/patient expects to be discharged to:: Private residence Living Arrangements: Spouse/significant other Available Help at Discharge: Family;Available 24 hours/day Type of Home: House Home Access: Stairs to enter [employer] of Steps: 4 Entrance Stairs-Rails: Right;Left Home Layout: One level     Bathroom Shower/Tub: Producer, television/film/video: Standard     Home Equipment: Transport chair;Grab bars - tub/shower;Rollator (4 wheels);Shower seat;Toilet riser;Adaptive equipment;Shower seat - built in [employer]: Reacher        Prior Functioning/Environment Prior Level of Function : Needs assist             Mobility Comments: ambulates pushing transport chair prior to admission; short household distances ADLs Comments: does not drive, managing ADLs, spouse assists with IADLs (med mgmt), light cooking        OT Problem List: Decreased strength;Decreased activity tolerance;Impaired balance (sitting and/or standing);Decreased knowledge of use of DME or AE;Decreased knowledge of precautions;Decreased safety awareness      OT Treatment/Interventions: Self-care/ADL training;DME and/or AE instruction;Therapeutic exercise;Therapeutic activities;Patient/family education;Balance training    OT Goals(Current goals can be found in the care plan section) Acute Rehab OT Goals Patient Stated Goal: home OT Goal Formulation: With patient Time For Goal Achievement: 04/02/23 Potential to Achieve Goals: Good  OT Frequency: Min 1X/week    Co-evaluation              AM-PAC OT "6 Clicks" Daily Activity     Outcome Measure Help from another person  eating meals?: None Help from another person taking care of personal grooming?: A Little Help from another person toileting, which includes using toliet, bedpan, or urinal?: A Little Help from another person bathing (including washing, rinsing, drying)?: A Little Help from another person to put on and taking off regular upper body clothing?: None Help from another person to put on and taking off regular lower body clothing?: A Little 6 Click Score: 20   End of Session Equipment Utilized During Treatment: Gait belt;Rolling walker (2 wheels);Other (comment) (post op shoe) Nurse Communication: Mobility status  Activity Tolerance: Patient tolerated treatment well Patient left: with call bell/phone within reach;with family/visitor present;Other (comment) (sitting EOB)  OT Visit Diagnosis: Other abnormalities of gait and mobility (R26.89);Muscle weakness (generalized) (M62.81)                Time: 8657-8469 OT Time Calculation (min): 27 min Charges:  OT General Charges $OT Visit: 1 Visit OT Evaluation $OT Eval Moderate Complexity: 1 Mod OT Treatments $Self Care/Home Management : 8-22 mins  Barry Brunner, OT Acute Rehabilitation Services Office [phone]   Chancy Milroy 03/19/2023, 1:42 PM

## 2023-03-19 NOTE — Progress Notes (Signed)
[facility]                                                                              [name], is a 82 y.o. female, DOB - Jul 28, 1940, [id number]:[id number] Admit date - 03/13/2023    Outpatient Primary MD for the patient is Benita Stabile, MD  LOS - 6  days  Chief Complaint  Patient presents with   Wound Check   Leg Pain       Brief summary   Patient is a 82 year old female with HTN, HLD, stroke, A-fib on Eliquis, chronic anemia, PAD with arterial wounds of bilateral lower extremity, breast CA status post left mastectomy and chemotherapy presented to ED with nonhealing wound of the right lower extremity. Patient follows with podiatry, Dr. Allena Katz and has been dealing with the ankle wound for about a year. On 11/5, patient was seen by vascular surgery, Dr. Chestine Spore On 11/19, she had arthrectomy, angioplasty of the right lower extremity arteries.  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  She has completed the course of antibiotics. On the morning of admission, she was seen by the home health nurse and noted to have worsening wound and was sent to ED.  Patient noted to have open right ankle ulcer on the lateral aspect with wide area of cellulitis around it. In ED, temp 100.6 F, HR 100, BP stable, on room air. CBC showed WBC 25.3, hemoglobin 11, platelets 338, lactic acid 2.2.  Creatinine 1.7. Podiatry was consulted, admitted for further workup.   Assessment & Plan    Principal Problem:   Sepsis (HCC), POA secondary to infected right ankle arterial ulcer with cellulitis -Met sepsis criteria with fever, tachycardia, leukocytosis, lactic acidosis, source secondary to right ankle ulcer -MRI right foot shows mild to moderate edema and soft tissue swelling of the medial greater than lateral aspect of the ankle and the dorsal lateral midfoot, acute osteomyelitis distal fibular diaphysis and metaphysis -Blood cultures negative so  far -Podiatry following, underwent I&D of ulceration to the subcutaneous fat level, right lateral ankle, bone biopsy application of dermal allograft on 03/15/23 -Wound cultures showing Streptococcus pyogenous, Corynebacterium -Bone biopsy did not show inflammatory changes.  ESR 92, CRP 13.5 ID following, recommended vancomycin which will provide optimal coverage, follow creatinine and levels -Will consider linezolid after 2 weeks of treatment  Active problems Peripheral artery disease  s/p recent angioplasty and stenting of multiple right lower extremity arteries - Dr. Myra Gianotti -Resumed Eliquis and Plavix   Chronic systolic CHF, Hypertension -Continue metoprolol -Resumed Lasix 40 mg daily, outpatient dose -Recent echo 05/2022 with EF of 40 to 45%, global hypokinesis    Hyperlipidemia Continue statin    Permanent atrial fibrillation -Rate controlled, continue metoprolol, amiodarone -Eliquis resumed    Prolonged Qtc EKG on admission with QTc 529 ms -Avoid QTc prolonging meds, -Maintain K ~ 4, Mg >2  Chronic kidney disease stage 4 - Renal function is poor at baseline with most recent creatinine 1.7. -Creatinine improved to 1.3   breast cancer  s/p left mastectomy and chemo  Constipation -Continue Senokot, MiraLAX  Underweight,  Estimated body mass index is 15.94 kg/m as calculated from the following:   Height as of this encounter: 5\' 3"  (1.6 m).   Weight as of this encounter: 40.8 kg.  Code Status: Full code DVT Prophylaxis:  apixaban (ELIQUIS) tablet 2.5 mg Start: 03/16/23 1745 apixaban (ELIQUIS) tablet 2.5 mg   Level of Care: Level of care: Telemetry Medical Family Communication: Updated patient Disposition Plan:      Remains inpatient appropriate: ID antibiotics recommendations for disposition   Procedures:  1.  Irrigation and excisional debridement of ulceration to subcutaneous fat level with prep for graft, 6 x 3 x 0.5 cm, right lateral ankle 2.  Bone biopsy  via cannulated needle, distal fibula right ankle 3.  Application dermal allograft-Arthroflex -6.5 x 3.5 x 0.5 cm, right lateral ankle  Consultants:   Podiatry  Antimicrobials:   Anti-infectives (From admission, onward)    Start     Dose/Rate Route Frequency Ordered Stop   03/19/23 1800  vancomycin (VANCOCIN) IVPB 1000 mg/200 mL premix        1,000 mg 200 mL/hr over 60 Minutes Intravenous  Once 03/19/23 0827     03/19/23 0724  vancomycin variable dose per unstable renal function (pharmacist dosing)         Does not apply See admin instructions 03/19/23 0724     03/18/23 2230  vancomycin (VANCOREADY) IVPB 500 mg/100 mL        500 mg 100 mL/hr over 60 Minutes Intravenous  Once 03/18/23 2135 03/19/23 0608   03/18/23 1315  vancomycin (VANCOREADY) IVPB 500 mg/100 mL        500 mg 100 mL/hr over 60 Minutes Intravenous  Once 03/18/23 1218 03/18/23 1704   03/16/23 1600  DAPTOmycin (CUBICIN) 350 mg in sodium chloride 0.9 % IVPB  Status:  Discontinued        8 mg/kg  40.8 kg 114 mL/hr over 30 Minutes Intravenous Every 48 hours 03/16/23 1512 03/18/23 1209   03/15/23 1300  vancomycin (VANCOREADY) IVPB 500 mg/100 mL  Status:  Discontinued        500 mg 100 mL/hr over 60 Minutes Intravenous Every 48 hours 03/13/23 1842 03/16/23 1501   03/15/23 1138  vancomycin (VANCOCIN) 1-5 GM/200ML-% IVPB       Note to Pharmacy: [name] E: cabinet override      03/15/23 1138 03/15/23 2344   03/14/23 1300  cefTRIAXone (ROCEPHIN) 2 g in sodium chloride 0.9 % 100 mL IVPB  Status:  Discontinued        2 g 200 mL/hr over 30 Minutes Intravenous Every 24 hours 03/13/23 1839 03/18/23 1209   03/13/23 1245  vancomycin (VANCOCIN) IVPB 1000 mg/200 mL premix        1,000 mg 200 mL/hr over 60 Minutes Intravenous  Once 03/13/23 1241 03/13/23 1447   03/13/23 1245  cefTRIAXone (ROCEPHIN) 2 g in sodium chloride 0.9 % 100 mL IVPB        2 g 200 mL/hr over 30 Minutes Intravenous  Once 03/13/23 1241 03/13/23 1341           Medications  amiodarone  100 mg Oral QODAY   apixaban  2.5 mg Oral BID   clopidogrel  75 mg Oral q morning   furosemide  20 mg Oral Daily   metoprolol tartrate  75 mg Oral BID   pantoprazole  40 mg Oral Daily   polyethylene glycol  17 g Oral Daily   pravastatin  40 mg Oral Daily   senna-docusate  1 tablet Oral BID   umeclidinium bromide  1 puff Inhalation Daily   vancomycin variable dose per unstable renal function (pharmacist dosing)   Does not apply See admin instructions      Subjective:   [name] was seen and examined today.  No acute complaints, nausea or vomiting, chest pain or shortness of breath.  No fevers  Objective:   Vitals:   03/18/23 1000 03/18/23 1309 03/18/23 2013 03/19/23 0433  BP: (!) 146/81 130/73 (!) 145/71 (!) 145/73  Pulse: 85 86 82 63  Resp:  17 16 19   Temp:  98 F (36.7 C) 97.9 F (36.6 C) 98.3 F (36.8 C)  TempSrc:  Oral Oral Oral  SpO2:  91% 94% 97%  Weight:      Height:        Intake/Output Summary (Last 24 hours) at 03/19/2023 1422 Last data filed at 03/18/2023 2033 Gross per 24 hour  Intake 332.26 ml  Output --  Net 332.26 ml     Wt Readings from Last 3 Encounters:  03/15/23 40.8 kg  03/04/23 41.3 kg  02/26/23 40.8 kg    Physical Exam General: Alert and oriented x 3, NAD Cardiovascular: S1 S2 clear, RRR.  Respiratory: CTAB Gastrointestinal: Soft, nontender, nondistended, NBS Ext: no pedal edema bilaterally Neuro: no new deficits Skin: Dressing intact RLE  Psych: Normal affect  Data Reviewed:  I have personally reviewed following labs    CBC Lab Results  Component Value Date   WBC 12.0 (H) 03/19/2023   RBC 3.05 (L) 03/19/2023   HGB 9.8 (L) 03/19/2023   HCT 30.5 (L) 03/19/2023   MCV 100.0 03/19/2023   MCH 32.1 03/19/2023   PLT 342 03/19/2023   MCHC 32.1 03/19/2023   RDW 17.0 (H) 03/19/2023   LYMPHSABS 1.0 03/13/2023   MONOABS 0.8 03/13/2023   EOSABS 0.0 03/13/2023   BASOSABS 0.3 (H) 03/13/2023      Last metabolic panel Lab Results  Component Value Date   NA 136 03/19/2023   K 4.4 03/19/2023   CL 104 03/19/2023   CO2 24 03/19/2023   BUN 30 (H) 03/19/2023   CREATININE 1.39 (H) 03/19/2023   GLUCOSE 101 (H) 03/19/2023   GFRNONAA 37 (L) 03/19/2023   GFRAA >60 02/20/2015   CALCIUM 8.4 (L) 03/19/2023   PHOS 2.2 (L) 03/06/2022   PROT 7.2 03/13/2023   ALBUMIN 3.1 (L) 03/13/2023   BILITOT 0.8 03/13/2023   ALKPHOS 63 03/13/2023   AST 30 03/13/2023   ALT 23 03/13/2023   ANIONGAP 8 03/19/2023    CBG (last 3)  No results for input(s): "GLUCAP" in the last 72 hours.    Coagulation Profile: No results for input(s): "INR", "PROTIME" in the last 168 hours.   Radiology Studies: I have personally reviewed the imaging studies  No results found.     Thad Ranger M.D. [facility] 03/19/2023, 2:22 PM  Available via Epic secure chat 7am-7pm After 7 pm, please refer to night coverage provider listed on amion.

## 2023-03-19 NOTE — Plan of Care (Signed)
  Problem: Education: Goal: Knowledge of General Education information will improve Description: Including pain rating scale, medication(s)/side effects and non-pharmacologic comfort measures 03/19/2023 0534 by Debbrah Alar, RN Outcome: Not Progressing 03/18/2023 2036 by Debbrah Alar, RN Outcome: Not Progressing   Problem: Health Behavior/Discharge Planning: Goal: Ability to manage health-related needs will improve 03/19/2023 0534 by Debbrah Alar, RN Outcome: Not Progressing 03/18/2023 2036 by Debbrah Alar, RN Outcome: Not Progressing   Problem: Clinical Measurements: Goal: Ability to maintain clinical measurements within normal limits will improve 03/19/2023 0534 by Debbrah Alar, RN Outcome: Not Progressing 03/18/2023 2036 by Debbrah Alar, RN Outcome: Not Progressing Goal: Will remain free from infection 03/19/2023 0534 by Debbrah Alar, RN Outcome: Not Progressing 03/18/2023 2036 by Debbrah Alar, RN Outcome: Not Progressing Goal: Diagnostic test results will improve 03/19/2023 0534 by Debbrah Alar, RN Outcome: Not Progressing 03/18/2023 2036 by Velia Meyer D, RN Outcome: Not Progressing Goal: Respiratory complications will improve 03/19/2023 0534 by Debbrah Alar, RN Outcome: Not Progressing 03/18/2023 2036 by Velia Meyer D, RN Outcome: Not Progressing Goal: Cardiovascular complication will be avoided 03/19/2023 0534 by Debbrah Alar, RN Outcome: Not Progressing 03/18/2023 2036 by Debbrah Alar, RN Outcome: Not Progressing   Problem: Activity: Goal: Risk for activity intolerance will decrease 03/19/2023 0534 by Debbrah Alar, RN Outcome: Not Progressing 03/18/2023 2036 by Debbrah Alar, RN Outcome: Not Progressing   Problem: Nutrition: Goal: Adequate nutrition will be maintained 03/19/2023 0534 by Debbrah Alar, RN Outcome: Not Progressing 03/18/2023 2036 by Velia Meyer D,  RN Outcome: Not Progressing   Problem: Coping: Goal: Level of anxiety will decrease 03/19/2023 0534 by Debbrah Alar, RN Outcome: Not Progressing 03/18/2023 2036 by Velia Meyer D, RN Outcome: Not Progressing   Problem: Elimination: Goal: Will not experience complications related to bowel motility 03/19/2023 0534 by Debbrah Alar, RN Outcome: Not Progressing 03/18/2023 2036 by Debbrah Alar, RN Outcome: Not Progressing Goal: Will not experience complications related to urinary retention 03/19/2023 0534 by Debbrah Alar, RN Outcome: Not Progressing 03/18/2023 2036 by Debbrah Alar, RN Outcome: Not Progressing   Problem: Pain Management: Goal: General experience of comfort will improve 03/19/2023 0534 by Debbrah Alar, RN Outcome: Not Progressing 03/18/2023 2036 by Debbrah Alar, RN Outcome: Not Progressing   Problem: Safety: Goal: Ability to remain free from injury will improve 03/19/2023 0534 by Debbrah Alar, RN Outcome: Not Progressing 03/18/2023 2036 by Velia Meyer D, RN Outcome: Not Progressing   Problem: Skin Integrity: Goal: Risk for impaired skin integrity will decrease 03/19/2023 0534 by Debbrah Alar, RN Outcome: Not Progressing 03/18/2023 2036 by Debbrah Alar, RN Outcome: Not Progressing

## 2023-03-19 NOTE — Evaluation (Signed)
Physical Therapy Evaluation Patient Details Name: [name] MRN: [id number] DOB: 12-21-1940 Today's Date: 03/19/2023  History of Present Illness  Pt is a 82 y/o female presenting on 12/4 with non healing wound of R LE. Admitted with sepsis from infected R ankle with cellulitis. S/P I&D 12/6, WBAT R LE in post op shoe. PMH includes: anemia, afib, breast cancer s/p mastectomy and chemo, HTN, meningioma, pneumonia, CVA.  Clinical Impression  PTA, pt lives with her spouse, is independent with ADL's and is a limited household ambulator pushing a transport chair. Pt overall is likely fairly close to her baseline, but does present with generalized weakness, debility, frailty and impaired standing balance.Pt ambulating room distances with a walker and CGA for stability. Would benefit from HHPT to address deficits and maximize functional mobility.        If plan is discharge home, recommend the following: A little help with walking and/or transfers;A little help with bathing/dressing/bathroom;Assistance with cooking/housework;Assist for transportation;Help with stairs or ramp for entrance   Can travel by private vehicle        Equipment Recommendations None recommended by PT  Recommendations for Other Services       Functional Status Assessment Patient has had a recent decline in their functional status and demonstrates the ability to make significant improvements in function in a reasonable and predictable amount of time.     Precautions / Restrictions Precautions Precautions: Fall Required Braces or Orthoses: Other Brace Other Brace: post op shoe R LE Restrictions Weight Bearing Restrictions: Yes RLE Weight Bearing: Weight bearing as tolerated      Mobility  Bed Mobility Overal bed mobility: Modified Independent             General bed mobility comments: HOB elevated but no assist    Transfers Overall transfer level: Needs assistance Equipment used: Rolling walker (2  wheels) Transfers: Sit to/from Stand Sit to Stand: Contact guard assist                Ambulation/Gait Ambulation/Gait assistance: Contact guard assist Gait Distance (Feet): 30 Feet Assistive device: Rolling walker (2 wheels) Gait Pattern/deviations: Step-through pattern, Decreased stride length Gait velocity: decreased Gait velocity interpretation: <1.31 ft/sec, indicative of household ambulator   General Gait Details: instruction for walker use, pt with tendency to push too far anteriorly and pick up off the ground, not typical AD  Stairs            Wheelchair Mobility     Tilt Bed    Modified Rankin (Stroke Patients Only)       Balance Overall balance assessment: Needs assistance Sitting-balance support: No upper extremity supported, Feet supported Sitting balance-Leahy Scale: Good     Standing balance support: Bilateral upper extremity supported, During functional activity Standing balance-Leahy Scale: Poor Standing balance comment: relies on BUE support dyamically                             Pertinent Vitals/Pain Pain Assessment Pain Assessment: Faces Faces Pain Scale: No hurt Pain Intervention(s): Monitored during session    Home Living Family/patient expects to be discharged to:: Private residence Living Arrangements: Spouse/significant other Available Help at Discharge: Family;Available 24 hours/day Type of Home: House Home Access: Stairs to enter Entrance Stairs-Rails: Doctor, general practice of Steps: 4   Home Layout: One level Home Equipment: Transport chair;Grab bars - tub/shower;Rollator (4 wheels);Shower seat;Toilet riser;Adaptive equipment;Shower seat - built in  Prior Function Prior Level of Function : Needs assist             Mobility Comments: ambulates pushing transport chair prior to admission; short household distances ADLs Comments: does not drive, managing ADLs, spouse assists with IADLs (med  mgmt), light cooking     Extremity/Trunk Assessment   Upper Extremity Assessment Upper Extremity Assessment: Defer to OT evaluation    Lower Extremity Assessment Lower Extremity Assessment: Generalized weakness       Communication   Communication Communication: No apparent difficulties  Cognition Arousal: Alert Behavior During Therapy: WFL for tasks assessed/performed Overall Cognitive Status: Within Functional Limits for tasks assessed                                          General Comments      Exercises     Assessment/Plan    PT Assessment Patient needs continued PT services  PT Problem List Decreased strength;Decreased activity tolerance;Decreased balance;Decreased mobility       PT Treatment Interventions DME instruction;Gait training;Functional mobility training;Therapeutic activities;Therapeutic exercise;Balance training;Patient/family education    PT Goals (Current goals can be found in the Care Plan section)  Acute Rehab PT Goals Patient Stated Goal: go home PT Goal Formulation: With patient Time For Goal Achievement: 04/02/23 Potential to Achieve Goals: Good    Frequency Min 1X/week     Co-evaluation               AM-PAC PT "6 Clicks" Mobility  Outcome Measure Help needed turning from your back to your side while in a flat bed without using bedrails?: None Help needed moving from lying on your back to sitting on the side of a flat bed without using bedrails?: None Help needed moving to and from a bed to a chair (including a wheelchair)?: A Little Help needed standing up from a chair using your arms (e.g., wheelchair or bedside chair)?: A Little Help needed to walk in hospital room?: A Little Help needed climbing 3-5 steps with a railing? : A Lot 6 Click Score: 19    End of Session Equipment Utilized During Treatment: Gait belt;Other (comment) (post op shoe) Activity Tolerance: Patient tolerated treatment well Patient  left: in bed;with call bell/phone within reach;with bed alarm set Nurse Communication: Mobility status PT Visit Diagnosis: Unsteadiness on feet (R26.81);History of falling (Z91.81);Muscle weakness (generalized) (M62.81)    Time: 1610-9604 PT Time Calculation (min) (ACUTE ONLY): 24 min   Charges:   PT Evaluation $PT Eval Low Complexity: 1 Low PT Treatments $Therapeutic Activity: 8-22 mins PT General Charges $$ ACUTE PT VISIT: 1 Visit         Lillia Pauls, PT, DPT Acute Rehabilitation Services Office [phone]   Norval Morton 03/19/2023, 4:31 PM

## 2023-03-19 NOTE — Progress Notes (Signed)
Pharmacy Antibiotic Note  [name] is a 82 y.o. female admitted on 03/13/2023 with R-ankle wound. Pharmacy has been consulted for Vancomycin dosing.  The patient had two levels drawn - peak and random - over 12/10 + 12/11 that resulted at 31 and 16 mcg/ml respectively for an estimated patient specific ke of 0.0735  Plan: - Start Vancomycin 1250 mg IV every 24 hours (eAUC 488 using patient specific kinetics) - Will plan to recheck levels at ss - either inpatient or outpatient with home health - Will continue to follow renal function, culture results, LOT, and antibiotic de-escalation plans   Height: 5\' 3"  (160 cm) Weight: 40.8 kg (90 lb) IBW/kg (Calculated) : 52.4  Temp (24hrs), Avg:98.2 F (36.8 C), Min:97.9 F (36.6 C), Max:98.3 F (36.8 C)  Recent Labs  Lab 03/13/23 1145 03/13/23 1341 03/14/23 0458 03/15/23 0528 03/16/23 0401 03/17/23 0556 03/18/23 2031 03/19/23 0533  WBC  --   --  17.1* 9.7 10.0 10.3  --  12.0*  CREATININE  --   --  1.62* 1.51* 1.43* 1.47*  --  1.39*  LATICACIDVEN 2.2* 1.8  --   --   --   --   --   --   VANCOPEAK  --   --   --   --   --   --  10*  --     Estimated Creatinine Clearance: 19.4 mL/min (A) (by C-G formula based on SCr of 1.39 mg/dL (H)).    Allergies  Allergen Reactions   Iodine Rash and Other (See Comments)    BETADINE, blistering rash/burns   Iohexol Rash and Other (See Comments)    Blisters; PT NEEDS 13-HOUR PREP    Petroleum Gauze Non-Woven 3x9" [Wound Dressings] Other (See Comments)    BURNING SENSATION, RED SKIN   Sulfa Antibiotics Nausea And Vomiting   Wound Dressing Adhesive Other (See Comments)    Burns skin   Tape Itching, Rash and Other (See Comments)    "DO NOT USE ADHESIVE TAPE" Not even a Band Aid" NO PAPER TAPE Burns skin Skin is very very thin    Antimicrobials this admission: Vancomycin 12/4 >> 12/6; restart 12/9 CRO 12/4 >> 12/8 Daptomycin 12/7 x 1  Dose adjustments this admission:   Microbiology  results: 12/6 ankle tissue >> rare GAS  12/6 ankle tissue << rare GAS + corynebacterium striatum  Thank you for allowing pharmacy to be a part of this patient's care.  Georgina Pillion, PharmD, BCPS, BCIDP Infectious Diseases Clinical Pharmacist 03/20/2023 12:30 PM   **Pharmacist phone directory can now be found on [email or web address] ([email or web address]).  Listed under [facility].

## 2023-03-20 ENCOUNTER — Inpatient Hospital Stay (HOSPITAL_COMMUNITY): Payer: Medicare HMO

## 2023-03-20 ENCOUNTER — Ambulatory Visit: Payer: Medicare HMO | Admitting: Podiatry

## 2023-03-20 DIAGNOSIS — L03115 Cellulitis of right lower limb: Secondary | ICD-10-CM | POA: Diagnosis not present

## 2023-03-20 DIAGNOSIS — K219 Gastro-esophageal reflux disease without esophagitis: Secondary | ICD-10-CM

## 2023-03-20 DIAGNOSIS — L97312 Non-pressure chronic ulcer of right ankle with fat layer exposed: Secondary | ICD-10-CM | POA: Diagnosis not present

## 2023-03-20 DIAGNOSIS — A419 Sepsis, unspecified organism: Secondary | ICD-10-CM | POA: Diagnosis not present

## 2023-03-20 DIAGNOSIS — I4819 Other persistent atrial fibrillation: Secondary | ICD-10-CM | POA: Diagnosis not present

## 2023-03-20 LAB — CBC
HCT: 32.2 % — ABNORMAL LOW (ref 36.0–46.0)
Hemoglobin: 10.2 g/dL — ABNORMAL LOW (ref 12.0–15.0)
MCH: 32.3 pg (ref 26.0–34.0)
MCHC: 31.7 g/dL (ref 30.0–36.0)
MCV: 101.9 fL — ABNORMAL HIGH (ref 80.0–100.0)
Platelets: 383 10*3/uL (ref 150–400)
RBC: 3.16 MIL/uL — ABNORMAL LOW (ref 3.87–5.11)
RDW: 17.5 % — ABNORMAL HIGH (ref 11.5–15.5)
WBC: 16.6 10*3/uL — ABNORMAL HIGH (ref 4.0–10.5)
nRBC: 0.2 % (ref 0.0–0.2)

## 2023-03-20 LAB — BASIC METABOLIC PANEL
Anion gap: 12 (ref 5–15)
BUN: 27 mg/dL — ABNORMAL HIGH (ref 8–23)
CO2: 20 mmol/L — ABNORMAL LOW (ref 22–32)
Calcium: 8.7 mg/dL — ABNORMAL LOW (ref 8.9–10.3)
Chloride: 105 mmol/L (ref 98–111)
Creatinine, Ser: 1.43 mg/dL — ABNORMAL HIGH (ref 0.44–1.00)
GFR, Estimated: 36 mL/min — ABNORMAL LOW (ref >60)
Glucose, Bld: 118 mg/dL — ABNORMAL HIGH (ref 70–99)
Potassium: 3.9 mmol/L (ref 3.5–5.1)
Sodium: 137 mmol/L (ref 135–145)

## 2023-03-20 LAB — AEROBIC/ANAEROBIC CULTURE W GRAM STAIN (SURGICAL/DEEP WOUND): Gram Stain: NONE SEEN

## 2023-03-20 LAB — VANCOMYCIN, PEAK: Vancomycin Pk: 31 ug/mL (ref 30–40)

## 2023-03-20 LAB — VANCOMYCIN, RANDOM: Vancomycin Rm: 16 ug/mL

## 2023-03-20 MED ORDER — CHLORHEXIDINE GLUCONATE CLOTH 2 % EX PADS: 6.0000 | MEDICATED_PAD | Freq: Every day | CUTANEOUS | Status: DC

## 2023-03-20 MED ORDER — SODIUM CHLORIDE 0.9% FLUSH: 10.0000 mL | INTRAVENOUS | Status: DC | PRN

## 2023-03-20 MED ORDER — OXYCODONE-ACETAMINOPHEN 5-325 MG PO TABS: 1.0000 | ORAL_TABLET | ORAL | 0 refills | Status: DC | PRN

## 2023-03-20 MED ORDER — SODIUM CHLORIDE 0.9% FLUSH
10.0000 mL | Freq: Two times a day (BID) | INTRAVENOUS | Status: DC
Start: 2023-03-20 — End: 2023-03-20

## 2023-03-20 MED ORDER — VANCOMYCIN IV (FOR PTA / DISCHARGE USE ONLY): 1250.0000 mg | INTRAVENOUS | 0 refills | Status: AC

## 2023-03-20 MED ORDER — ACETAMINOPHEN 325 MG PO TABS: 650.0000 mg | ORAL_TABLET | Freq: Four times a day (QID) | ORAL | 0 refills | Status: AC | PRN

## 2023-03-20 MED ORDER — VANCOMYCIN HCL 1250 MG/250ML IV SOLN
1250.0000 mg | INTRAVENOUS | Status: DC
  Administered 2023-03-20: 1250 mg via INTRAVENOUS
  Filled 2023-03-20: qty 250

## 2023-03-20 NOTE — TOC Transition Note (Signed)
Transition of Care [facility]) - CM/SW Discharge Note   Patient Details  Name: [name] MRN: [id number] Date of Birth: [birth year]  Transition of Care [facility]) CM/SW Contact:  Epifanio Lesches, RN Phone Number: 03/20/2023, 3:05 PM   Clinical Narrative:    Patient will DC OZ:HYQM Anticipated DC date: 03/20/2023 Family notified: yes Transport by: car   Per MD patient ready for DC today pending PICC line placement. Home infusion teaching completed by Pam with [facility]. RN, patient, patient's family and [facility] notified of DC.   Post hospital f/u noted on AVS. Husband to provide transportation home. Pt without RX med concerns.  RNCM will sign off for now as intervention is no longer needed. Please consult [place] again if new needs arise.    Final next level of care: Home w Home Health Services Barriers to Discharge: No Barriers Identified   Patient Goals and CMS Choice   Choice offered to / list presented to : Patient  Discharge Placement                         Discharge Plan and Services Additional resources added to the After Visit Summary for     Discharge Planning Services: CM Consult            DME Arranged: Other see comment DME Agency: Other - Comment ([facility]) Date DME Agency Contacted: 03/19/23 Time DME Agency Contacted: 1142 Representative spoke with at DME Agency: Pam HH Arranged: PT, OT, RN HH Agency: [facility] Date [facility] Agency Contacted: 03/20/23 Time HH Agency Contacted: [phone] Representative spoke with at [facility] Agency: Marylene Land  Social Determinants of Health (SDOH) Interventions SDOH Screenings   Food Insecurity: No Food Insecurity (11/17/2022)  Housing: Patient Declined (11/17/2022)  Transportation Needs: No Transportation Needs (11/17/2022)  Utilities: Not At Risk (11/17/2022)  Social Connections: Unknown (08/29/2022)   [facility]  Tobacco Use: Medium Risk (03/15/2023)      Readmission Risk Interventions    06/18/2022    1:55 PM 03/07/2022    8:09 AM  Readmission Risk Prevention Plan  Transportation Screening Complete Complete  HRI or Home Care Consult Complete Complete  Social Work Consult for Recovery Care Planning/Counseling Complete Complete  Palliative Care Screening Not Applicable Not Applicable  Medication Review [employer]) Complete Complete

## 2023-03-20 NOTE — Progress Notes (Signed)
Occupational Therapy Treatment Patient Details Name: [name] MRN: [id number] DOB: Oct 03, 1940 Today's Date: 03/20/2023   History of present illness Pt is a 82 y/o female presenting on 12/4 with non healing wound of R LE. Admitted with sepsis from infected R ankle with cellulitis. S/P I&D 12/6, WBAT R LE in post op shoe. PMH includes: anemia, afib, breast cancer s/p mastectomy and chemo, HTN, meningioma, pneumonia, CVA.   OT comments  Patient declined going to bathroom or standing for ADLs due to did not want to donn post op shoe. Patient able to perform UB/LB bathing with setup seated on EOB. Patient instructed in BUE HEP with yellow therapy band. Patient making good progress and will continue to be followed by acute OT to focus on OOB ADLs.       If plan is discharge home, recommend the following:  A little help with walking and/or transfers;A little help with bathing/dressing/bathroom;Assistance with cooking/housework;Assist for transportation;Help with stairs or ramp for entrance   Equipment Recommendations  None recommended by OT    Recommendations for Other Services      Precautions / Restrictions Precautions Precautions: Fall Required Braces or Orthoses: Other Brace Other Brace: post op shoe R LE Restrictions Weight Bearing Restrictions: Yes RLE Weight Bearing: Weight bearing as tolerated       Mobility Bed Mobility Overal bed mobility: Modified Independent             General bed mobility comments: HOB elevated    Transfers Overall transfer level: Needs assistance                 General transfer comment: declined OOB     Balance Overall balance assessment: Needs assistance Sitting-balance support: No upper extremity supported, Feet supported Sitting balance-Leahy Scale: Good                                     ADL either performed or assessed with clinical judgement   ADL Overall ADL's : Needs assistance/impaired      Grooming: Wash/dry hands;Wash/dry face;Oral care;Set up;Sitting   Upper Body Bathing: Set up;Sitting   Lower Body Bathing: Set up;Sitting/lateral leans                         General ADL Comments: Patient declined going to bathroom for self care or standing for peri care    Extremity/Trunk Assessment              Vision       Perception     Praxis      Cognition Arousal: Alert Behavior During Therapy: WFL for tasks assessed/performed Overall Cognitive Status: Within Functional Limits for tasks assessed                                          Exercises Exercises: General Upper Extremity General Exercises - Upper Extremity Shoulder Flexion: Strengthening, 10 reps, Both, Seated, Theraband Theraband Level (Shoulder Flexion): Level 1 (Yellow) Shoulder Horizontal ABduction: Strengthening, Both, 15 reps, Seated, Theraband Theraband Level (Shoulder Horizontal Abduction): Level 1 (Yellow) Elbow Flexion: Strengthening, Both, 15 reps, Seated, Theraband Theraband Level (Elbow Flexion): Level 1 (Yellow) Elbow Extension: Strengthening, Both, 15 reps, Seated, Theraband Theraband Level (Elbow Extension): Level 1 (Yellow)    Shoulder Instructions  General Comments      Pertinent Vitals/ Pain       Pain Assessment Pain Assessment: Faces Faces Pain Scale: No hurt Pain Intervention(s): Monitored during session  Home Living                                          Prior Functioning/Environment              Frequency  Min 1X/week        Progress Toward Goals  OT Goals(current goals can now be found in the care plan section)  Progress towards OT goals: Progressing toward goals  Acute Rehab OT Goals Patient Stated Goal: go home when medically ready OT Goal Formulation: With patient Time For Goal Achievement: 04/02/23 Potential to Achieve Goals: Good ADL Goals Pt Will Perform Grooming: with modified  independence;standing;sitting Pt Will Perform Lower Body Dressing: with modified independence;sit to/from stand;sitting/lateral leans Pt Will Transfer to Toilet: with modified independence;ambulating;bedside commode Pt Will Perform Toileting - Clothing Manipulation and hygiene: with modified independence;sitting/lateral leans;sit to/from stand Pt/caregiver will Perform Home Exercise Program: Increased strength;Both right and left upper extremity;With written HEP provided;With theraband;Independently  Plan      Co-evaluation                 AM-PAC OT "6 Clicks" Daily Activity     Outcome Measure   Help from another person eating meals?: None Help from another person taking care of personal grooming?: A Little Help from another person toileting, which includes using toliet, bedpan, or urinal?: A Little Help from another person bathing (including washing, rinsing, drying)?: A Little Help from another person to put on and taking off regular upper body clothing?: None Help from another person to put on and taking off regular lower body clothing?: A Little 6 Click Score: 20    End of Session    OT Visit Diagnosis: Other abnormalities of gait and mobility (R26.89);Muscle weakness (generalized) (M62.81)   Activity Tolerance Patient tolerated treatment well   Patient Left in bed;with call bell/phone within reach;with bed alarm set   Nurse Communication Mobility status        Time: 0722-0748 OT Time Calculation (min): 26 min  Charges: OT General Charges $OT Visit: 1 Visit OT Treatments $Self Care/Home Management : 8-22 mins $Therapeutic Exercise: 8-22 mins  Alfonse Flavors, OTA Acute Rehabilitation Services  Office [phone]   Dewain Penning 03/20/2023, 9:32 AM

## 2023-03-20 NOTE — Discharge Summary (Signed)
Physician Discharge Summary  LOUIZA HUWE QIH:[id number] DOB: [birth year] DOA: 03/13/2023  PCP: Benita Stabile, MD  Admit date: 03/13/2023 Discharge date: 03/20/2023  Admitted From: Home Disposition: Home  Recommendations for Outpatient Follow-up:  Follow up with PCP in 1-2 weeks follow-up with podiatry as scheduled Follow-up with infectious disease as scheduled  Home Health: None Equipment/Devices: No new equipment  Discharge Condition: Stable CODE STATUS: Full Diet recommendation: As tolerated  Brief/Interim Summary: Patient is a 82 year old female with HTN, HLD, stroke, A-fib on Eliquis, chronic anemia, PAD with arterial wounds of bilateral lower extremity, breast CA status post left mastectomy and chemotherapy presented to ED with nonhealing wound of the right lower extremity. Patient follows with podiatry, Dr. Allena Katz and has been dealing with the ankle wound for about a year.  On 11/5, patient was seen by vascular surgery, Dr. Chestine Spore On 11/19, she had arthrectomy, angioplasty of the right lower extremity arteries.  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  She has completed the course of antibiotics. On the morning of admission - 12/04 - she was seen by the home health nurse and noted to have worsening wound and was sent to ED.  Patient noted to have open right ankle ulcer on the lateral aspect with wide area of cellulitis around it.  Patient completed I&D 12/6 without complication, cultures show multiple species including strep pyogenes and corynebacterium -ID recommending ongoing vancomycin, transition to linezolid after 2 weeks of IV treatment to ensure improvement in symptoms and infection.  At this time patient has had PICC line placed and is stable and agreeable for discharge home with ongoing home health care therapy at her previous living facility.  Discharge Diagnoses:  Principal Problem:   Sepsis (HCC) Active Problems:    Persistent atrial fibrillation (HCC)   COPD, moderate (HCC)   CAD in native artery   PAD (peripheral artery disease) (HCC)   GERD (gastroesophageal reflux disease)   Cellulitis of right lower extremity   Skin ulcer of right ankle with fat layer exposed (HCC)   Wound infection    Discharge Instructions  Discharge Instructions     [facility] pharmacist to adjust dose for Vancomycin, Aminoglycosides and other anti-infective therapies as requested by physician.   Complete by: As directed    [facility] to provide Cath Flo 2mg    Complete by: As directed    Administer for PICC line occlusion and as ordered by physician for other access device issues.   Anaphylaxis Kit: Provided to treat any anaphylactic reaction to the medication being provided to the patient if First Dose or when requested by physician   Complete by: As directed    Epinephrine 1mg /ml vial / amp: Administer 0.3mg  (0.66ml) subcutaneously once for moderate to severe anaphylaxis, nurse to call physician and pharmacy when reaction occurs and call 911 if needed for immediate care   Diphenhydramine 50mg /ml IV vial: Administer 25-50mg  IV/IM PRN for first dose reaction, rash, itching, mild reaction, nurse to call physician and pharmacy when reaction occurs   Sodium Chloride 0.9% NS IV: Administer if needed for hypovolemic blood pressure drop or as ordered by physician after call to physician with anaphylactic reaction   Change dressing on IV access line weekly and PRN   Complete by: As directed    Flush IV access with Sodium Chloride 0.9% and Heparin 10 units/ml or 100 units/ml   Complete by: As directed    Home infusion instructions -  [facility]   Complete by: As directed    Instructions: Flush IV access with Sodium Chloride 0.9% and Heparin 10units/ml or 100units/ml   Change dressing on IV access line: Weekly and PRN   Instructions Cath Flo 2mg : Administer for PICC Line occlusion and as  ordered by physician for other access device   [facility] pharmacist to adjust dose for: Vancomycin, Aminoglycosides and other anti-infective therapies as requested by physician   Method of administration may be changed at the discretion of home infusion pharmacist based upon assessment of the patient and/or caregiver's ability to self-administer the medication ordered   Complete by: As directed       Allergies as of 03/20/2023       Reactions   Iodine Rash, Other (See Comments)   BETADINE, blistering rash/burns   Iohexol Rash, Other (See Comments)   Blisters; PT NEEDS 13-HOUR PREP   Petroleum Gauze Non-woven 3x9" [wound Dressings] Other (See Comments)   BURNING SENSATION, RED SKIN   Sulfa Antibiotics Nausea And Vomiting   Wound Dressing Adhesive Other (See Comments)   Burns skin   Tape Itching, Rash, Other (See Comments)   "DO NOT USE ADHESIVE TAPE" Not even a Band Aid" NO PAPER TAPE Burns skin Skin is very very thin        Medication List     STOP taking these medications    cephALEXin 500 MG capsule Commonly known as: KEFLEX   doxycycline 100 MG tablet Commonly known as: VIBRA-TABS       TAKE these medications    acetaminophen 325 MG tablet Commonly known as: TYLENOL Take 2 tablets (650 mg total) by mouth every 6 (six) hours as needed for mild pain (pain score 1-3) (or Fever >/= 101).   albuterol 108 (90 Base) MCG/ACT inhaler Commonly known as: VENTOLIN HFA Inhale 2 puffs into the lungs every 6 (six) hours as needed for wheezing or shortness of breath.   AMBULATORY NON FORMULARY MEDICATION Dispense one Readi Steadi glove   amiodarone 200 MG tablet Commonly known as: PACERONE Take 0.5 tablets (100 mg total) by mouth every other day. What changed:  how much to take when to take this   apixaban 2.5 MG Tabs tablet Commonly known as: Eliquis Take 1 tablet (2.5 mg total) by mouth 2 (two) times daily.   ascorbic acid 500 MG tablet Commonly  known as: VITAMIN C Take 500 mg by mouth in the morning.   CALCIUM PO Take 1,200 mg by mouth 2 (two) times daily.   clopidogrel 75 MG tablet Commonly known as: PLAVIX TAKE 1 TABLET EVERY MORNING What changed: when to take this   denosumab 60 MG/ML Sosy injection Commonly known as: PROLIA Inject 60 mg into the skin every 6 (six) months.   furosemide 20 MG tablet Commonly known as: LASIX Take 2 tablets (40 mg total) by mouth daily.   Incruse Ellipta 62.5 MCG/ACT Aepb Generic drug: umeclidinium bromide Inhale 1 puff into the lungs daily.   IRON PO Take 65 mg by mouth at bedtime.   MAGNESIUM OXIDE PO Take 500 mg by mouth every morning.   metoprolol tartrate 25 MG tablet Commonly known as: LOPRESSOR Take 3 tablets (75 mg total) by mouth 2 (two) times daily.   multivitamin with minerals Tabs tablet Take 1 tablet by mouth every morning.   pantoprazole 40 MG tablet Commonly known as: PROTONIX Take 1 tablet (40 mg total) by mouth daily.   potassium chloride 10 MEQ tablet Commonly  known as: KLOR-CON M Take 2 tablets (20 mEq total) by mouth daily. What changed: how much to take   pravastatin 40 MG tablet Commonly known as: PRAVACHOL TAKE 1 TABLET DAILY What changed: when to take this   Salonpas 3.04-14-08 % Ptch Generic drug: Camphor-Menthol-Methyl Sal Place 1 patch onto the skin daily as needed (pain.).   Santyl 250 UNIT/GM ointment Generic drug: collagenase APPLY TO AFFECTED AREAS ONCE DAILY. What changed: See the new instructions.   vancomycin IVPB Inject 1,250 mg into the vein daily for 12 days. Indication:  R-ankle infection First Dose: Yes Last Day of Therapy:  04/01/23 Labs - "Sunday/Monday:  CBC/D, BMP, and vancomycin trough. Labs - Thursday:  BMP and vancomycin trough Labs - Once weekly: ESR and CRP Method of administration:Elastomeric Method of administration may be changed at the discretion of the patient and/or caregiver's ability to self-administer  the medication ordered.   Vitamin D-3 25 MCG (1000 UT) Caps Take 1,000 Units by mouth 2 (two) times daily.               Discharge Care Instructions  (From admission, onward)           Start     Ordered   03/20/23 0000  Change dressing on IV access line weekly and PRN  (Home infusion instructions - [facility] )        12" /11/24 1404            Allergies  Allergen Reactions   Iodine Rash and Other (See Comments)    BETADINE, blistering rash/burns   Iohexol Rash and Other (See Comments)    Blisters; PT NEEDS 13-HOUR PREP    Petroleum Gauze Non-Woven 3x9" [Wound Dressings] Other (See Comments)    BURNING SENSATION, RED SKIN   Sulfa Antibiotics Nausea And Vomiting   Wound Dressing Adhesive Other (See Comments)    Burns skin   Tape Itching, Rash and Other (See Comments)    "DO NOT USE ADHESIVE TAPE" Not even a Band Aid" NO PAPER TAPE Burns skin Skin is very very thin    Consultations: ID, podiatry   Procedures/Studies: DG Chest Port 1 View  Result Date: 03/20/2023 CLINICAL DATA:  PICC line placement. EXAM: PORTABLE CHEST 1 VIEW COMPARISON:  Chest x-ray from same day 1008 hours. FINDINGS: The left PICC line tip remains looped on itself overlying the mid SVC and may be in the azygous arch. The heart size and mediastinal contours are within normal limits. Allowing for differences in technique, small cavitary lesion in the left lower lobe is not significantly changed since May. Unchanged scarring in the right lung. No focal consolidation, pleural effusion, or pneumothorax. No acute osseous abnormality. IMPRESSION: 1. Left PICC line tip remains looped on itself overlying the mid SVC and may be in the azygous arch. Recommend repositioning. Electronically Signed   By: Obie Dredge M.D.   On: 03/20/2023 12:54   DG Chest Port 1 View  Result Date: 03/20/2023 CLINICAL DATA:  [id number] Status post peripherally inserted central catheter (PICC) central line  placement [id number] EXAM: PORTABLE CHEST 1 VIEW COMPARISON:  11/17/2022 FINDINGS: Left-sided PICC line is in place with the distal tip coiled upon itself at the level of the distal SVC. Mild cardiomegaly. Aortic atherosclerosis. Areas of linear scarring in the right lung. No new airspace consolidation. No pleural effusion or pneumothorax. IMPRESSION: 1. Left-sided PICC line is in place with the distal tip coiled upon itself at the level of the distal SVC. Recommend  repositioning. 2. No pneumothorax. Electronically Signed   By: Duanne Guess D.O.   On: 03/20/2023 10:51   [place] EKG SITE RITE  Result Date: 03/19/2023 If Site Rite image not attached, placement could not be confirmed due to current cardiac rhythm.  MR ANKLE RIGHT WO CONTRAST  Result Date: 03/14/2023 CLINICAL DATA:  Distal fibular osteomyelitis suspected. EXAM: MRI OF THE RIGHT ANKLE WITHOUT CONTRAST TECHNIQUE: Multiplanar, multisequence MR imaging of the ankle was performed. No intravenous contrast was administered. COMPARISON:  Right ankle radiographs 03/13/2023 FINDINGS: Despite efforts by the technologist and patient, mild to moderate motion artifact is present on today's exam and could not be eliminated. This reduces exam sensitivity and specificity. TENDONS Peroneal: The peroneus longus and brevis tendons are intact. Posteromedial: Intact tibialis posterior, flexor digitorum longus, and flexor hallucis longus tendons. Anterior: The tibialis anterior, extensor hallucis longus, and extensor digitorum longus tendons are intact. Achilles: Intact. Plantar Fascia: Intact. LIGAMENTS Lateral: The anterior and posterior talofibular, anterior and posterior tibiofibular, and calcaneofibular ligaments are intact. Medial: The tibiotalar deep deltoid and tibial spring ligaments are intact. CARTILAGE Ankle Joint: Within the limitations of patient motion artifact, there appears to be mild-to-moderate tibiotalar cartilage thinning. Subtalar Joints/Sinus Tarsi:  The subtalar cartilage is intact.Fat is preserved within the sinus tarsi. Bones: Mild superior calcaneocuboid, talonavicular, navicular-cuneiform, and tarsometatarsal cartilage thinning. Minimal subchondral marrow edema at the medial aspect of the first tarsometatarsal joint (axial series 4, image 21), degenerative. Other: There is mild-to-moderate edema and soft tissue swelling of the medial greater than lateral aspects of the ankle and of the dorsal lateral midfoot. This is greatest within the dorsolateral midfoot at the level of the metatarsals. Mild edema within the visualized flexor hallucis longus muscle at the posterosuperior ankle/distal calf. There is thinning of the skin and subcutaneous fat from a wound at the lateral aspect of the distal fibular diaphysis and metaphysis, measuring up to approximately 3.1 cm in craniocaudal dimension and approximately 0.3 cm in transverse depth. Within the limitations of patient motion artifact, no adjacent distal fibular cortical erosion is seen. No definite marrow edema to indicate acute osteomyelitis. IMPRESSION: 1. Mild-to-moderate edema and soft tissue swelling of the medial greater than lateral aspects of the ankle and of the dorsal lateral midfoot. 2. Wound just lateral to the distal fibular diaphysis and metaphysis. Within the limitations of patient motion artifact, no adjacent distal fibular cortical erosion or marrow edema is to indicate acute osteomyelitis. 3. Mild-to-moderate tibiotalar cartilage thinning. 4. Mild midfoot osteoarthritis. Electronically Signed   By: Neita Garnet M.D.   On: 03/14/2023 12:29   DG Ankle Complete Right  Result Date: 03/13/2023 CLINICAL DATA:  Cellulitis. EXAM: RIGHT ANKLE - COMPLETE 3+ VIEW COMPARISON:  None Available. FINDINGS: There is no acute fracture or dislocation. The bones are osteopenic. The ankle mortise is intact. Skin wound over the lateral malleolus. Scattered soft tissue calcification likely sequela chronic  venous stasis. IMPRESSION: 1. No acute fracture or dislocation. 2. Skin wound over the lateral malleolus. Electronically Signed   By: Elgie Collard M.D.   On: 03/13/2023 19:43   VAS [place] LOWER EXTREMITY VENOUS (DVT)  Result Date: 03/13/2023  Lower Venous DVT Study Patient Name:  LESSLEY SHUTE  Date of Exam:   03/13/2023 Medical Rec #: [id number]     Accession #:    [id number] Date of Birth: 1941/03/19     Patient Gender: F Patient Age:   82 years Exam Location:  [facility] Procedure:  VAS [place] LOWER EXTREMITY VENOUS (DVT) Referring Phys: DAN FLOYD --------------------------------------------------------------------------------  Indications: Pain, and ulceration.  Limitations: Open wound. Comparison Study: No prior exam. Performing Technologist: Fernande Bras  Examination Guidelines: A complete evaluation includes B-mode imaging, spectral Doppler, color Doppler, and power Doppler as needed of all accessible portions of each vessel. Bilateral testing is considered an integral part of a complete examination. Limited examinations for reoccurring indications may be performed as noted. The reflux portion of the exam is performed with the patient in reverse Trendelenburg.  +---------+---------------+---------+-----------+----------+--------------+ RIGHT    CompressibilityPhasicitySpontaneityPropertiesThrombus Aging +---------+---------------+---------+-----------+----------+--------------+ CFV      Full           Yes      Yes                                 +---------+---------------+---------+-----------+----------+--------------+ SFJ      Full                                                        +---------+---------------+---------+-----------+----------+--------------+ FV Prox  Full                                                        +---------+---------------+---------+-----------+----------+--------------+ FV Mid   Full                                                         +---------+---------------+---------+-----------+----------+--------------+ FV DistalFull                                                        +---------+---------------+---------+-----------+----------+--------------+ PFV      Full                                                        +---------+---------------+---------+-----------+----------+--------------+ POP      Full           Yes      Yes                                 +---------+---------------+---------+-----------+----------+--------------+ PTV      Full                                                        +---------+---------------+---------+-----------+----------+--------------+ PERO     Full                                                        +---------+---------------+---------+-----------+----------+--------------+  Edema noted in right calf. Enlarged lymph node noted in right groin measuing 2 x 1.1 x 1.6 cm, with vascularity.  +----+---------------+---------+-----------+----------+--------------+ LEFTCompressibilityPhasicitySpontaneityPropertiesThrombus Aging +----+---------------+---------+-----------+----------+--------------+ CFV Full           Yes      Yes                                 +----+---------------+---------+-----------+----------+--------------+     Summary: RIGHT: - There is no evidence of deep vein thrombosis in the lower extremity.  - No cystic structure found in the popliteal fossa.  LEFT: - No evidence of common femoral vein obstruction.   *See table(s) above for measurements and observations. Electronically signed by Coral Else MD on 03/13/2023 at 7:10:28 PM.    Final    PERIPHERAL VASCULAR CATHETERIZATION  Result Date: 02/26/2023 Images from the original result were not included. Patient name: ALAYZIAH GOTSCH MRN: [id number] DOB: 06-18-1940 Sex: female 02/26/2023 Pre-operative Diagnosis: Right foot ulcer Post-operative diagnosis:  Same Surgeon:  [name] Procedure Performed:  1.  Ultrasound-guided access, left femoral artery  2.  Abdominal aortogram  3.  Right lower extremity runoff  4.  Selective injection with catheter in right superficial femoral artery  5.  Laser atherectomy and drug-coated balloon angioplasty, right superficial femoral and popliteal artery  6.  Angioplasty, right anterior tibial artery  7.  Angioplasty, right peroneal artery  8.  Conscious sedation, 75 minutes  9.  Closure device, Mynx Indications: This is an 82 year old female who was undergone multiple attempts at revascularization for limb salvage.  She has had a deterioration in the wound on her right leg and comes in today for angiographic evaluation Procedure:  The patient was identified in the holding area and taken to room 8.  The patient was then placed supine on the table and prepped and draped in the usual sterile fashion.  A time out was called.  Conscious sedation was administered with the use of IV fentanyl and Versed under continuous physician and nurse monitoring.  Heart rate, blood pressure, and oxygen saturation were continuously monitored.  Total sedation time was 75 minutes.  Ultrasound was used to evaluate the left common femoral artery.  It was patent .  A digital ultrasound image was acquired.  A micropuncture needle was used to access the left common femoral artery under ultrasound guidance.  An 018 wire was advanced without resistance and a micropuncture sheath was placed.  The 018 wire was removed and a benson wire was placed.  The micropuncture sheath was exchanged for a 5 french sheath.  An omniflush catheter was advanced over the wire to the level of L-1.  An abdominal angiogram was obtained.  Next, using the omniflush catheter and a benson wire, the aortic bifurcation was crossed and the catheter was placed into theright external iliac artery and right runoff was obtained. Findings:  Aortogram: No significant renal artery stenosis was visualized.  The  infrarenal abdominal aorta is widely patent.  Bilateral common and external iliac arteries are widely patent.  Bilateral femoral arteries are widely patent.  Right Lower Extremity: The right common femoral and profundofemoral artery are widely patent.  The superficial femoral artery is diffusely diseased with at least 50% stenosis at the adductor canal and greater than 50% stenosis behind the knee and the joint space.  The anterior tibial artery has a short segment subtotal occlusion.  There is stenosis within the peroneal artery greater  than 70%. Intervention: After the above images were acquired the decision was made to proceed with intervention.  A 6 French 45 cm sheath was advanced into the right superficial femoral artery.  The patient was fully heparinized additional contrast injections were performed with the catheter tip in the superficial femoral artery for better imaging.  There was calcified stenosis within the adductor canal that appeared to be hemodynamically significant as well as in the popliteal artery behind the joint space.  I really did not want to stent the popliteal artery behind the joint space and therefore felt that laser atherectomy was indicated to minimize the risk of dissection and subsequent stent placement.  I selected a 1.7 laser catheter and perform laser atherectomy of the superficial femoral and popliteal artery.  I used a 5 mm Ranger drug-coated balloon to treat the stenosis.  Completion imaging showed no significant residual disease. Attention was then turned towards the anterior tibial artery.  This was engaged with a V-18 wire.  I used a 2.5 x 150 Sterling balloon to treat the entire length of the anterior tibial artery.  A second inflation was performed with the balloon into the popliteal artery to treat the ostial stenosis.  Completion imaging showed significant improvement with near resolution of the stenosis within the anterior tibial artery.  There was still disease at the  ostium but I was reluctant to be more aggressive.  I did place a V-18 wire into the peroneal artery to protect the peroneal while I was treating the ostial lesion.  I then treated the peroneal artery using a 2.5 x 80 Sterling balloon.  Completion imaging showed resolution of the stenosis.  I was satisfied with these results.  The groin was closed with a Mynx Impression:  #1  Multiple heavily calcified lesions within the right superficial femoral artery at the adductor canal as well as in the joint space behind the knee.  This was treated with laser atherectomy using a 1.7 laser and a 5 mm drug-coated Ranger balloon with resolution of the stenosis  #2  Subtotal occlusion of the right anterior tibial artery treated with a 2.5 mm balloon.  Ostial stenosis as well was treated with a 2.5 mm balloon.  There was residual stenosis at the ostium however I did not want to be more aggressive due to concerns over the risk of complication  #3  Greater than 50% tandem stenosis within the peroneal artery successfully treated with a 2.5 mm balloon  V. [name], M.D., FACS [facility] Office: [phone] Pager:  [phone]     Subjective: No acute issue/events overnight   Discharge Exam: Vitals:   03/20/23 0815 03/20/23 0838  BP:  (!) 128/59  Pulse: (!) 59 60  Resp: 17   Temp:    SpO2: 95%    Vitals:   03/20/23 0502 03/20/23 0800 03/20/23 0815 03/20/23 0838  BP: (!) 108/59 94/65  (!) 128/59  Pulse:  (!) 59 (!) 59 60  Resp: 14 17 17    Temp: 98.4 F (36.9 C) 97.8 F (36.6 C)    TempSrc:  Oral    SpO2: 96% 95% 95%   Weight:      Height:        General: Pt is alert, awake, not in acute distress Cardiovascular: RRR, S1/S2 +, no rubs, no gallops Respiratory: CTA bilaterally, no wheezing, no rhonchi Abdominal: Soft, NT, ND, bowel sounds + Extremities: no edema, no cyanosis    The results of significant  diagnostics from this hospitalization (including imaging,  microbiology, ancillary and laboratory) are listed below for reference.     Microbiology: Recent Results (from the past 240 hour(s))  Blood culture (routine x 2)     Status: None   Collection Time: 03/13/23 11:32 AM   Specimen: BLOOD  Result Value Ref Range Status   Specimen Description BLOOD RIGHT ANTECUBITAL  Final   Special Requests   Final    BOTTLES DRAWN AEROBIC AND ANAEROBIC Blood Culture results may not be optimal due to an inadequate volume of blood received in culture bottles   Culture   Final    NO GROWTH 5 DAYS Performed at [facility], [address], [place], [place] [address]    Report Status 03/18/2023 FINAL  Final  Blood culture (routine x 2)     Status: None   Collection Time: 03/13/23 11:37 AM   Specimen: BLOOD  Result Value Ref Range Status   Specimen Description BLOOD LEFT ANTECUBITAL  Final   Special Requests BOTTLES DRAWN AEROBIC ONLY BACTEROIDES CACCAE  Final   Culture   Final    NO GROWTH 5 DAYS Performed at [facility], [address], [place], [place] [address]    Report Status 03/18/2023 FINAL  Final  Surgical pcr screen     Status: Abnormal   Collection Time: 03/14/23  3:09 PM   Specimen: Nasal Mucosa; Nasal Swab  Result Value Ref Range Status   MRSA, PCR POSITIVE (A) NEGATIVE Final    Comment: RESULT CALLED TO, READ BACK BY AND VERIFIED WITH: RN JUN GIRAL ON 03/14/23 @ 1800 BY DRT    Staphylococcus aureus POSITIVE (A) NEGATIVE Final    Comment: (NOTE) The Xpert SA Assay (FDA approved for NASAL specimens in patients 50 years of age and older), is one component of a comprehensive surveillance program. It is not intended to diagnose infection nor to guide or monitor treatment. Performed at [facility], [address] [place], [place] [address]   Aerobic/Anaerobic Culture w Gram Stain (surgical/deep wound)     Status: None (Preliminary result)   Collection Time: 03/15/23 12:22 PM   Specimen: Soft Tissue, Other  Result  Value Ref Range Status   Specimen Description TISSUE  Final   Special Requests RIGHT LATERAL ANKLE  Final   Gram Stain   Final    RARE WBC PRESENT, PREDOMINANTLY PMN NO ORGANISMS SEEN Performed at [facility], [address] [place], [place] [address]    Culture   Final    RARE GROUP A STREP (S.PYOGENES) ISOLATED Beta hemolytic streptococci are predictably susceptible to penicillin and other beta lactams. Susceptibility testing not routinely performed. NO ANAEROBES ISOLATED; CULTURE IN PROGRESS FOR 5 DAYS    Report Status PENDING  Incomplete  Aerobic/Anaerobic Culture w Gram Stain (surgical/deep wound)     Status: None (Preliminary result)   Collection Time: 03/15/23 12:32 PM   Specimen: Soft Tissue, Other  Result Value Ref Range Status   Specimen Description WOUND  Final   Special Requests SWAB OF LEFT ANKLE TISSUE POST LAVAGE  Final   Gram Stain   Final    NO WBC SEEN RARE GRAM POSITIVE COCCI IN PAIRS Performed at [facility], [address], [place], [place] [address]    Culture   Final    RARE CORYNEBACTERIUM STRIATUM Standardized susceptibility testing for this organism is not available. RARE GROUP A STREP (S.PYOGENES) ISOLATED Beta hemolytic streptococci are predictably susceptible  to penicillin and other beta lactams. Susceptibility testing not routinely performed. NO ANAEROBES ISOLATED; CULTURE IN PROGRESS FOR 5 DAYS    Report Status PENDING  Incomplete     Labs: BNP (last 3 results) Recent Labs    06/22/22 0742 07/09/22 1432 11/17/22 1809  BNP 2,405.0* 1,992.0* 2,137.0*   Basic Metabolic Panel: Recent Labs  Lab 03/15/23 0528 03/16/23 0401 03/17/23 0556 03/19/23 0533 03/20/23 0837  NA 135 136 137 136 137  K 3.9 4.0 4.7 4.4 3.9  CL 106 107 105 104 105  CO2 21* 20* 21* 24 20*  GLUCOSE 156* 104* 95 101* 118*  BUN 35* 27* 32* 30* 27*  CREATININE 1.51* 1.43* 1.47* 1.39* 1.43*  CALCIUM 8.2* 7.9* 8.3* 8.4* 8.7*   CBC: Recent Labs  Lab  03/15/23 0528 03/16/23 0401 03/17/23 0556 03/19/23 0533 03/20/23 0837  WBC 9.7 10.0 10.3 12.0* 16.6*  HGB 8.7* 9.2* 9.6* 9.8* 10.2*  HCT 26.8* 28.5* 29.8* 30.5* 32.2*  MCV 97.1 98.3 99.7 100.0 101.9*  PLT 265 270 306 342 383   Cardiac Enzymes: Recent Labs  Lab 03/16/23 1814  CKTOTAL 72   Urinalysis    Component Value Date/Time   COLORURINE YELLOW 03/13/2023 1511   APPEARANCEUR CLEAR 03/13/2023 1511   LABSPEC 1.012 03/13/2023 1511   PHURINE 5.0 03/13/2023 1511   GLUCOSEU NEGATIVE 03/13/2023 1511   HGBUR NEGATIVE 03/13/2023 1511   BILIRUBINUR NEGATIVE 03/13/2023 1511   KETONESUR NEGATIVE 03/13/2023 1511   PROTEINUR 30 (A) 03/13/2023 1511   UROBILINOGEN 0.2 02/15/2015 0417   NITRITE NEGATIVE 03/13/2023 1511   LEUKOCYTESUR NEGATIVE 03/13/2023 1511   Sepsis Labs Recent Labs  Lab 03/16/23 0401 03/17/23 0556 03/19/23 0533 03/20/23 0837  WBC 10.0 10.3 12.0* 16.6*   Microbiology Recent Results (from the past 240 hour(s))  Blood culture (routine x 2)     Status: None   Collection Time: 03/13/23 11:32 AM   Specimen: BLOOD  Result Value Ref Range Status   Specimen Description BLOOD RIGHT ANTECUBITAL  Final   Special Requests   Final    BOTTLES DRAWN AEROBIC AND ANAEROBIC Blood Culture results may not be optimal due to an inadequate volume of blood received in culture bottles   Culture   Final    NO GROWTH 5 DAYS Performed at [facility], [address] [place], [place] [address]    Report Status 03/18/2023 FINAL  Final  Blood culture (routine x 2)     Status: None   Collection Time: 03/13/23 11:37 AM   Specimen: BLOOD  Result Value Ref Range Status   Specimen Description BLOOD LEFT ANTECUBITAL  Final   Special Requests BOTTLES DRAWN AEROBIC ONLY BACTEROIDES CACCAE  Final   Culture   Final    NO GROWTH 5 DAYS Performed at [facility], [address], [place], [place] [address]    Report Status 03/18/2023 FINAL  Final  Surgical pcr screen      Status: Abnormal   Collection Time: 03/14/23  3:09 PM   Specimen: Nasal Mucosa; Nasal Swab  Result Value Ref Range Status   MRSA, PCR POSITIVE (A) NEGATIVE Final    Comment: RESULT CALLED TO, READ BACK BY AND VERIFIED WITH: RN JUN GIRAL ON 03/14/23 @ 1800 BY DRT    Staphylococcus aureus POSITIVE (A) NEGATIVE Final    Comment: (NOTE) The Xpert SA Assay (FDA approved for NASAL specimens in patients 19 years of age and older), is one component of a comprehensive surveillance program.  It is not intended to diagnose infection nor to guide or monitor treatment. Performed at [facility], [address], [place], [place] [address]   Aerobic/Anaerobic Culture w Gram Stain (surgical/deep wound)     Status: None (Preliminary result)   Collection Time: 03/15/23 12:22 PM   Specimen: Soft Tissue, Other  Result Value Ref Range Status   Specimen Description TISSUE  Final   Special Requests RIGHT LATERAL ANKLE  Final   Gram Stain   Final    RARE WBC PRESENT, PREDOMINANTLY PMN NO ORGANISMS SEEN Performed at [facility], [address], [place], [place] [address]    Culture   Final    RARE GROUP A STREP (S.PYOGENES) ISOLATED Beta hemolytic streptococci are predictably susceptible to penicillin and other beta lactams. Susceptibility testing not routinely performed. NO ANAEROBES ISOLATED; CULTURE IN PROGRESS FOR 5 DAYS    Report Status PENDING  Incomplete  Aerobic/Anaerobic Culture w Gram Stain (surgical/deep wound)     Status: None (Preliminary result)   Collection Time: 03/15/23 12:32 PM   Specimen: Soft Tissue, Other  Result Value Ref Range Status   Specimen Description WOUND  Final   Special Requests SWAB OF LEFT ANKLE TISSUE POST LAVAGE  Final   Gram Stain   Final    NO WBC SEEN RARE GRAM POSITIVE COCCI IN PAIRS Performed at [facility], [address] [place], [place] [address]    Culture   Final    RARE CORYNEBACTERIUM STRIATUM Standardized susceptibility testing  for this organism is not available. RARE GROUP A STREP (S.PYOGENES) ISOLATED Beta hemolytic streptococci are predictably susceptible to penicillin and other beta lactams. Susceptibility testing not routinely performed. NO ANAEROBES ISOLATED; CULTURE IN PROGRESS FOR 5 DAYS    Report Status PENDING  Incomplete     Time coordinating discharge: Over 30 minutes  SIGNED:   Azucena Fallen, DO [facility] 03/20/2023, 2:05 PM Pager   If 7PM-7AM, please contact night-coverage [email or web address]

## 2023-03-20 NOTE — Discharge Instructions (Signed)
Leave dressing clean dry and intact until follow-up in the office next week Tuesday, December 17. Please call for follow up appointment.

## 2023-03-20 NOTE — Progress Notes (Signed)
Mobility Specialist: Progress Note   03/20/23 1226  Mobility  Activity Ambulated with assistance in room  Level of Assistance Contact guard assist, steadying assist  Assistive Device Front wheel walker  Distance Ambulated (ft) 35 ft  RLE Weight Bearing WBAT  Activity Response Tolerated well  Mobility Referral Yes  Mobility visit 1 Mobility  Mobility Specialist Start Time (ACUTE ONLY) 1010  Mobility Specialist Stop Time (ACUTE ONLY) 1026  Mobility Specialist Time Calculation (min) (ACUTE ONLY) 16 min    Pt was agreeable to mobility session - received in bed. No complaints. Requested to use the bathroom; void complete and peri care done independently. Left in bed with all needs met, call bell in reach.   Maurene Capes Mobility Specialist Please contact via SecureChat or Rehab office at [phone]

## 2023-03-20 NOTE — Progress Notes (Signed)
Peripherally Inserted Central Catheter Placement  The IV Nurse has discussed with the patient and/or persons authorized to consent for the patient, the purpose of this procedure and the potential benefits and risks involved with this procedure.  The benefits include less needle sticks, lab draws from the catheter, and the patient may be discharged home with the catheter. Risks include, but not limited to, infection, bleeding, blood clot (thrombus formation), and puncture of an artery; nerve damage and irregular heartbeat and possibility to perform a PICC exchange if needed/ordered by physician.  Alternatives to this procedure were also discussed.  Bard Power PICC patient education guide, fact sheet on infection prevention and patient information card has been provided to patient /or left at bedside.    PICC Placement Documentation  PICC Single Lumen 03/20/23 Left Basilic 43 cm 0 cm (Active)  Indication for Insertion or Continuance of Line Home intravenous therapies (PICC only) 03/20/23 0937  Exposed Catheter (cm) 0 cm 03/20/23 0937  Site Assessment Clean, Dry, Intact 03/20/23 9811  Line Status Flushed;Saline locked;Blood return noted 03/20/23 0937  Dressing Type Transparent;Securing device 03/20/23 0937  Dressing Status Antimicrobial disc in place;Clean, Dry, Intact 03/20/23 0937  Line Care Connections checked and tightened 03/20/23 9147  Line Adjustment (NICU/IV Team Only) No 03/20/23 0937  Dressing Intervention New dressing;Adhesive placed at insertion site (IV team only);Other (Comment) 03/20/23 0937  Dressing Change Due 03/27/23 03/20/23 0937       Annett Fabian 03/20/2023, 9:38 AM

## 2023-03-20 NOTE — Progress Notes (Signed)
PHARMACY CONSULT NOTE FOR:  OUTPATIENT  PARENTERAL ANTIBIOTIC THERAPY (OPAT)  Indication: R-ankle infection Regimen: Vancomycin 1250 mg IV every 24 hours End date: 04/01/23  The patient will be seen in the clinic prior to end of OPAT to determine extension with Vancomycin vs transition to oral linezolid  IV antibiotic discharge orders are pended. To discharging provider:  please sign these orders via discharge navigator,  Select New Orders & click on the button choice - Manage This Unsigned Work.     Thank you for allowing pharmacy to be a part of this patient's care.  Georgina Pillion, PharmD, BCPS, BCIDP Infectious Diseases Clinical Pharmacist 03/20/2023 12:47 PM   **Pharmacist phone directory can now be found on [email or web address] ([email or web address]).  Listed under [facility].

## 2023-03-20 NOTE — Plan of Care (Signed)

## 2023-03-26 ENCOUNTER — Ambulatory Visit (INDEPENDENT_AMBULATORY_CARE_PROVIDER_SITE_OTHER): Payer: Medicare HMO | Admitting: Podiatry

## 2023-03-26 ENCOUNTER — Other Ambulatory Visit: Payer: Self-pay | Admitting: *Deleted

## 2023-03-26 DIAGNOSIS — L97322 Non-pressure chronic ulcer of left ankle with fat layer exposed: Secondary | ICD-10-CM | POA: Diagnosis not present

## 2023-03-26 DIAGNOSIS — Z9889 Other specified postprocedural states: Secondary | ICD-10-CM

## 2023-03-26 DIAGNOSIS — I70223 Atherosclerosis of native arteries of extremities with rest pain, bilateral legs: Secondary | ICD-10-CM

## 2023-03-26 DIAGNOSIS — I739 Peripheral vascular disease, unspecified: Secondary | ICD-10-CM

## 2023-03-26 NOTE — Progress Notes (Signed)
  Subjective:  Patient ID: CHIEKO PROSS, female    DOB: [birth year],  MRN: [id number]  Chief Complaint  Patient presents with   Wound Check    Patient is here for wound check of ankle with fatty layer exposed    DOS: 03/15/2023 Procedure: Right ankle ulcer debridement with prep for graft, bone biopsies distal fibula, application of arthroflex dermal allograft  82 y.o. female returns for post-op check.  Patient is approximately 10 days status post above procedures.  She reports she is doing well.  She has decreased pain and swelling in the right ankle.  Has kept dressing clean dry and intact.  Has been walking in a postop shoe.  Going to see infectious disease doctor tomorrow.  Review of Systems: Negative except as noted in the HPI. Denies N/V/F/Ch.   Objective:  There were no vitals filed for this visit. There is no height or weight on file to calculate BMI. Constitutional Well developed. Well nourished.  Vascular Foot warm and well perfused. Capillary refill normal to all digits.  Calf is soft and supple, no posterior calf or knee pain, negative Homans' sign  Neurologic Normal speech. Oriented to person, place, and time. Epicritic sensation to light touch grossly present bilaterally.  Dermatologic Grafts appear integrated though desiccated with eschar overlying.  Unclear if they are still viable however they are firmly adherent even after taking off staples.  Decreased edema of the right ankle and no erythema surrounding the right lateral ankle ulceration improved from prior    Orthopedic: Tenderness to palpation noted about the surgical site.   Multiple view plain film radiographs: Deferred   Path: Ulceration sent for pathology showed ulceration with acute inflammation and granulation tissue.  No specific diagnosis given as to any type of abnormal ulceration.  Bone biopsy right ankle distal fibula resulted with benign bone and connective tissue no inflammatory changes were  identified.  Culture: Corynebacterium stratum, group A strep Assessment:   1. Ankle ulcer, left, with fat layer exposed (HCC)   2. Post-operative state    Plan:  Patient was evaluated and treated and all questions answered.  S/p foot surgery right ankle ulcer debridement with prep for graft fibular bone biopsy and dermal allograft application -Progressing as expected post-operatively.  Overall wounds appear to be improving we will leave the dermal allograft in place for now eventually may need debridement in the office. -Continue local wound care with Santyl which patient has from prior to any residual wounds right ankle.  Cover with dry gauze and gauze roll -XR: Deferred -WB Status: Weightbearing as tolerated in surgical shoe -Sutures: Staples were removed from the dermal allograft today. -Medications: ABX per infectious disease appreciate recommendations patient has appoint with them tomorrow -Foot redressed.  Return in 2 weeks (on 04/09/2023) for  Follow-up right ankle ulcer status post surgery any provider.         Corinna Gab, DPM [facility]

## 2023-03-27 ENCOUNTER — Encounter: Payer: Self-pay | Admitting: Internal Medicine

## 2023-03-27 ENCOUNTER — Other Ambulatory Visit: Payer: Self-pay

## 2023-03-27 ENCOUNTER — Telehealth: Payer: Self-pay

## 2023-03-27 ENCOUNTER — Ambulatory Visit (INDEPENDENT_AMBULATORY_CARE_PROVIDER_SITE_OTHER): Payer: Medicare HMO | Admitting: Internal Medicine

## 2023-03-27 VITALS — BP 87/58 | HR 59 | Resp 16 | Ht 63.0 in | Wt 90.0 lb

## 2023-03-27 DIAGNOSIS — M86671 Other chronic osteomyelitis, right ankle and foot: Secondary | ICD-10-CM | POA: Diagnosis not present

## 2023-03-27 MED ORDER — LINEZOLID 600 MG PO TABS: 600.0000 mg | ORAL_TABLET | Freq: Two times a day (BID) | ORAL | 0 refills | Status: DC

## 2023-03-27 NOTE — Telephone Encounter (Signed)
Initiated PA for Linezolid 600 mg tablets through cover my meds.  KEY: [id number] Waiting on outcome. Juanita Laster, RMA

## 2023-03-27 NOTE — Progress Notes (Signed)
RFV: follow up for ankle wound  Patient ID: [name], female   DOB: 1940/07/10, 82 y.o.   MRN: [id number]  HPI [name] is POD #3 from right ankle debridement, bone biopsy and application of allograft in the setting of chronic right lateral ankle ulceration with cellulitis. Surgical cultures are growing Group A Streptococcus and Corynebacterium striatum on 12/6 OR cultures. Will change daptomycin to vancomycin secondary to better coverage reliability with vancomycin. Discussed plan of care for 2 weeks of IV vancomycin followed by 2 weeks of linezolid with possible longer duration pending surgical pathology results. Therapeutic drug monitoring of renal function and vancomycin levels. Pharmacy to adjust vancomycin levels prior to discharge. Will need PICC line placement prior to discharge. Continue post-operative wound care per Podiatry with remaining medical and supportive care per Internal Medicine.   Outpatient Encounter Medications as of 03/27/2023  Medication Sig   acetaminophen (TYLENOL) 325 MG tablet Take 2 tablets (650 mg total) by mouth every 6 (six) hours as needed for mild pain (pain score 1-3) (or Fever >/= 101).   albuterol (VENTOLIN HFA) 108 (90 Base) MCG/ACT inhaler Inhale 2 puffs into the lungs every 6 (six) hours as needed for wheezing or shortness of breath.   AMBULATORY NON FORMULARY MEDICATION Dispense one Readi Steadi glove   amiodarone (PACERONE) 200 MG tablet Take 0.5 tablets (100 mg total) by mouth every other day. (Patient taking differently: Take 200 mg by mouth 2 (two) times daily.)   apixaban (ELIQUIS) 2.5 MG TABS tablet Take 1 tablet (2.5 mg total) by mouth 2 (two) times daily.   ascorbic acid (VITAMIN C) 500 MG tablet Take 500 mg by mouth in the morning.   CALCIUM PO Take 1,200 mg by mouth 2 (two) times daily.   Camphor-Menthol-Methyl Sal (SALONPAS) 3.04-14-08 % PTCH Place 1 patch onto the skin daily as needed (pain.).   Cholecalciferol (VITAMIN D-3) 25 MCG (1000 UT)  CAPS Take 1,000 Units by mouth 2 (two) times daily.   clopidogrel (PLAVIX) 75 MG tablet TAKE 1 TABLET EVERY MORNING (Patient taking differently: Take 75 mg by mouth once.)   denosumab (PROLIA) 60 MG/ML SOSY injection Inject 60 mg into the skin every 6 (six) months.   Ferrous Sulfate (IRON PO) Take 65 mg by mouth at bedtime.   furosemide (LASIX) 20 MG tablet Take 2 tablets (40 mg total) by mouth daily.   MAGNESIUM OXIDE PO Take 500 mg by mouth every morning.   metoprolol tartrate (LOPRESSOR) 25 MG tablet Take 3 tablets (75 mg total) by mouth 2 (two) times daily.   Multiple Vitamin (MULTIVITAMIN WITH MINERALS) TABS tablet Take 1 tablet by mouth every morning.   oxyCODONE-acetaminophen (PERCOCET) 5-325 MG tablet Take 1 tablet by mouth every 4 (four) hours as needed for severe pain (pain score 7-10).   pantoprazole (PROTONIX) 40 MG tablet Take 1 tablet (40 mg total) by mouth daily.   potassium chloride (KLOR-CON M) 10 MEQ tablet Take 2 tablets (20 mEq total) by mouth daily. (Patient taking differently: Take 10 mEq by mouth daily.)   pravastatin (PRAVACHOL) 40 MG tablet TAKE 1 TABLET DAILY (Patient taking differently: Take 40 mg by mouth every evening.)   SANTYL 250 UNIT/GM ointment APPLY TO AFFECTED AREAS ONCE DAILY. (Patient taking differently: Apply 1 Application topically 2 (two) times daily. Apply as directed for bandage changes)   umeclidinium bromide (INCRUSE ELLIPTA) 62.5 MCG/ACT AEPB Inhale 1 puff into the lungs daily.   vancomycin IVPB Inject 1,250 mg into the vein daily  for 12 days. Indication:  R-ankle infection First Dose: Yes Last Day of Therapy:  04/01/23 Labs - Sunday/Monday:  CBC/D, BMP, and vancomycin trough. Labs - Thursday:  BMP and vancomycin trough Labs - Once weekly: ESR and CRP Method of administration:Elastomeric Method of administration may be changed at the discretion of the patient and/or caregiver's ability to self-administer the medication ordered. (Patient taking  differently: Inject 1,000 mg into the vein daily. Indication:  R-ankle infection First Dose: Yes Last Day of Therapy:  04/01/23 Labs - Sunday/Monday:  CBC/D, BMP, and vancomycin trough. Labs - Thursday:  BMP and vancomycin trough Labs - Once weekly: ESR and CRP Method of administration:Elastomeric Method of administration may be changed at the discretion of the patient and/or caregiver's ability to self-administer the medication ordered.)   cephALEXin (KEFLEX) 500 MG capsule TAKE ONE CAPSULE BY MOUTH TWICE DAILY FOR FIVE DAYS. (Patient not taking: Reported on 03/27/2023)   doxycycline (VIBRA-TABS) 100 MG tablet Take 1 tablet by mouth 2 (two) times daily. (Patient not taking: Reported on 03/27/2023)   No facility-administered encounter medications on file as of 03/27/2023.     Patient Active Problem List   Diagnosis Date Noted   Wound infection 03/18/2023   Cellulitis of right lower extremity 03/14/2023   Skin ulcer of right ankle with fat layer exposed (HCC) 03/14/2023   Sepsis (HCC) 03/13/2023   CHF, acute on chronic (HCC) 11/17/2022   CKD stage 3b, GFR 30-44 ml/min (HCC) 11/17/2022   D-dimer, elevated 11/17/2022   Acute respiratory failure with hypoxia (HCC) 06/18/2022   Aspiration pneumonitis (HCC) 06/18/2022   Acute on chronic HFrEF (heart failure with reduced ejection fraction) (HCC) 06/18/2022   Hypercoagulable state due to persistent atrial fibrillation (HCC) 06/11/2022   Acute heart failure with preserved ejection fraction (HFpEF) (HCC) 05/24/2022   Cellulitis and abscess of toe of left foot 03/07/2022   GERD (gastroesophageal reflux disease) 03/06/2022   Atypical atrial flutter (HCC)    Sepsis due to undetermined organism (HCC) 12/25/2021   Diarrhea 12/25/2021   Hyperkalemia 12/25/2021   Osteomyelitis of great toe of left foot (HCC) 12/25/2021   PAD (peripheral artery disease) (HCC) 12/19/2021   Osteoporosis    Tibial plateau fracture, left approx. 05/10/17 06/27/2017    COPD, moderate (HCC) 07/12/2015   Multiple gastric ulcers    Coagulopathy (HCC)    Bleeding gastrointestinal    Acute GI bleeding    CAD in native artery    Supratherapeutic INR    Right lower lobe lung mass    Cavitary lesion of lung    AKI (acute kidney injury) (HCC)    Chronic back pain    GI bleed 02/14/2015   Carotid artery disease (HCC) 02/14/2015   Leukocytosis 02/14/2015   Lung nodules 09/29/2014   Smoking history 09/29/2014   Pneumonia with cavity of lung 07/09/2014   Lung mass 07/09/2014   Thrombocytosis (HCC) 05/21/2014   Protein-calorie malnutrition, moderate (HCC) 05/21/2014   Anemia    Generalized weakness    Necrotic pneumonia (HCC) 05/20/2014   Pneumonia 05/20/2014   Elevated liver enzymes 12/16/2013   Tremor 06/24/2013   Encounter for therapeutic drug monitoring 05/18/2013   Dizziness 01/12/2013   Wound of ankle 10/23/2010   Long term current use of anticoagulant 07/05/2010   Carotid bruit 11/01/2009   Hyperlipidemia 10/26/2009   Essential hypertension 10/26/2009   Persistent atrial fibrillation (HCC) 10/26/2009   ACUTE ON CHRONIC DIASTOLIC HEART FAILURE 10/26/2009   SYNCOPE AND COLLAPSE 10/26/2009  Health Maintenance Due  Topic Date Due   Zoster Vaccines- Shingrix (2 of 2) 03/07/2017   Medicare Annual Wellness (AWV)  02/24/2020   DTaP/Tdap/Td (2 - Td or Tdap) 07/31/2021   COVID-19 Vaccine (4 - 2024-25 season) 03/19/2023     Review of Systems  Physical Exam   BP (!) 87/58   Pulse (!) 59   Resp 16   Ht 5\' 3"  (1.6 m)   Wt 90 lb (40.8 kg)   BMI 15.94 kg/m   Physical Exam  Constitutional:  oriented to person, place, and time. appears well-developed and well-nourished. No distress.  HENT: [place]/AT, PERRLA, no scleral icterus Mouth/Throat: Oropharynx is clear and moist. No oropharyngeal exudate.  Ext: right ankle wrapped.  Neurological: alert and oriented to person, place, and time.  Skin: Skin is warm and dry. No rash noted. No erythema.   Psychiatric: a normal mood and affect.  behavior is normal.    CBC Lab Results  Component Value Date   WBC 16.6 (H) 03/20/2023   RBC 3.16 (L) 03/20/2023   HGB 10.2 (L) 03/20/2023   HCT 32.2 (L) 03/20/2023   PLT 383 03/20/2023   MCV 101.9 (H) 03/20/2023   MCH 32.3 03/20/2023   MCHC 31.7 03/20/2023   RDW 17.5 (H) 03/20/2023   LYMPHSABS 1.0 03/13/2023   MONOABS 0.8 03/13/2023   EOSABS 0.0 03/13/2023    BMET Lab Results  Component Value Date   NA 137 03/20/2023   K 3.9 03/20/2023   CL 105 03/20/2023   CO2 20 (L) 03/20/2023   GLUCOSE 118 (H) 03/20/2023   BUN 27 (H) 03/20/2023   CREATININE 1.43 (H) 03/20/2023   CALCIUM 8.7 (L) 03/20/2023   GFRNONAA 36 (L) 03/20/2023   GFRAA >60 02/20/2015      Assessment and Plan Poly microbial malleolus osteomyelitis = Continue with original course of vancomycin then we will switch to  Linezolid 600mg  bid to finish out 4 wk of abtx . Will see back in 2-3 wk recheck labs to see that no thrombocytopenia from shorter course of linezolid. Continue with wound care.

## 2023-03-27 NOTE — Telephone Encounter (Signed)
  Per provider ok to PULL PICC after End Date.   Provider: Judyann Munson MD  End Date: 04/01/23 Start oral abx after 04/01/23.   Notified [facility] and [facility] to contact Home Health Nurse.

## 2023-03-28 ENCOUNTER — Other Ambulatory Visit (HOSPITAL_COMMUNITY): Payer: Self-pay

## 2023-03-28 ENCOUNTER — Encounter: Payer: Self-pay | Admitting: Internal Medicine

## 2023-03-28 NOTE — Telephone Encounter (Signed)
PA approved through 04/24/23. Will update pharmacy.  Juanita Laster, RMA

## 2023-04-01 ENCOUNTER — Ambulatory Visit (INDEPENDENT_AMBULATORY_CARE_PROVIDER_SITE_OTHER)
Admission: RE | Admit: 2023-04-01 | Discharge: 2023-04-01 | Disposition: A | Discharge status: Home / Self Care | Payer: Medicare HMO | Source: Ambulatory Visit | Attending: Surgery | Admitting: Surgery

## 2023-04-01 ENCOUNTER — Ambulatory Visit (INDEPENDENT_AMBULATORY_CARE_PROVIDER_SITE_OTHER): Payer: Medicare HMO | Admitting: Physician Assistant

## 2023-04-01 ENCOUNTER — Inpatient Hospital Stay: Payer: Medicare HMO | Admitting: Internal Medicine

## 2023-04-01 ENCOUNTER — Ambulatory Visit (HOSPITAL_COMMUNITY)
Admission: RE | Admit: 2023-04-01 | Discharge: 2023-04-01 | Disposition: A | Discharge status: Home / Self Care | Payer: Medicare HMO | Source: Ambulatory Visit | Attending: Surgery | Admitting: Surgery

## 2023-04-01 VITALS — BP 177/74 | HR 92 | Temp 96.8°F | Resp 20 | Ht 63.0 in | Wt 90.0 lb

## 2023-04-01 DIAGNOSIS — I739 Peripheral vascular disease, unspecified: Secondary | ICD-10-CM | POA: Insufficient documentation

## 2023-04-01 DIAGNOSIS — I70223 Atherosclerosis of native arteries of extremities with rest pain, bilateral legs: Secondary | ICD-10-CM | POA: Insufficient documentation

## 2023-04-01 LAB — VAS US ABI WITH/WO TBI
Left ABI: 0.65
Right ABI: 1.19

## 2023-04-01 NOTE — Progress Notes (Addendum)
HISTORY AND PHYSICAL     CC:  follow up. Requesting Provider:  Benita Stabile, MD  HPI: This is a 82 y.o. female who is here today for follow up for PAD.   She has carotid artery stenosis that is followed by Dr. Eden [name].  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  Her last carotid duplex 07/18/2022 revealed 40-59% left ICA stenosis.    Pt has hx of wounds on the left foot and was found to have critical limb ischemia and was scheduled for angiogram. On 12/19/2021 she underwent angiography with stent to the left SFA and drug coated balloon angioplasty of the left popliteal artery by Dr. Myra [name]. She was kept overnight and eliquis was restarted the next day and plavix continued. She was discharged home. She was admitted to the hospital later in the month with leukocytosis. She was having diarrhea and it was not felt her foot was the source of the leukocytosis. Her leukocytosis continued to improved and podiatry was consulted and she underwent debridement of left foot and amputation of left great toe with application of biologic by Dr. Allena [name] on 12/29/2021.  She had an angiogram with laser atherectomy and angioplasty of left popliteal and left ATA on 08/28/2022 by Dr. Myra [name] for left leg ulcer.  She underwent angiogram with laser atherectomy and drug coated balloon angioplasty of the right SFA and popliteal artery, angioplasty of the right ATA and peroneal arteries for right leg ulcer on 02/26/2023 by Dr. Myra [name].  The pt returns today for follow up and here with her husband of 54 years.  She states that she is doing well.  She recently was hospitalized with worsening right leg wound.  On 03/15/2023, she underwent irrigation and debridement of ulceration to subcu fat level, bone bx and application of dermal allograft by Dr. Annamary [name].  Surgical cultures grew Group A strep and corynebacterium and she required IV abx.  She finishes her IV abx today and starts po Zyvox  tomorrow.  She is continuing to follow with ID and podiatry.  She denies any pain in her feet.  She states she still has swelling in the right leg.  She had a DVT study on 03/15/2023 and this was negative for DVT.  She was seen by Dr. Annamary [name] on 03/26/2023 and felt she was progressing as expected.  Her husband is doing dressing changes.    She states that her BP runs in the 120's-130's at home.    She has hx of breast cancer, status post left mastectomy.  She is on Eliquis for atrial fibrillation.   She has carotid artery stenosis that is followed by Dr. Eden [name].  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   The pt is on a statin for cholesterol management.    The pt is not on an aspirin.    Other AC:  Plavix/Eliquis The pt is on diuretic, BB for hypertension.  The pt is not on medication for diabetes. Tobacco hx:  former    Past Medical History:  Diagnosis Date   Anemia 05/2014.   Atrial fibrillation (HCC)    x 3 yrs   Breast cancer (HCC)    S/P left mastectomy and chemotherapy 1989 remained in remission   Carotid artery occlusion    Carotid bruit    LICA 60-79% (duplex 9/11)   Degenerative arthritis of spine 2015   Chronic back pain   Hyperlipidemia    Hypertension  Meningioma [facility])    Osteoporosis    Pneumonia May 19, 2014   Stroke [facility])    CVA 2008   Subclavian steal syndrome    Ulcers of both lower extremities (HCC) 2015    Past Surgical History:  Procedure Laterality Date   ABDOMINAL AORTOGRAM W/LOWER EXTREMITY N/A 12/19/2021   Procedure: ABDOMINAL AORTOGRAM W/LOWER EXTREMITY;  Surgeon: Colleen Libman, MD;  [facility];  Service: Cardiovascular;  Laterality: N/A;   ABDOMINAL AORTOGRAM W/LOWER EXTREMITY Bilateral 08/28/2022   Procedure: ABDOMINAL AORTOGRAM W/LOWER EXTREMITY;  Surgeon: Colleen Libman, MD;  [facility]: [facility];  Service: Cardiovascular;  Laterality: Bilateral;   ABDOMINAL  AORTOGRAM W/LOWER EXTREMITY N/A 10/16/2022   Procedure: ABDOMINAL AORTOGRAM W/LOWER EXTREMITY;  Surgeon: Colleen Libman, MD;  [facility]: [facility];  Service: Cardiovascular;  Laterality: N/A;   ABDOMINAL AORTOGRAM W/LOWER EXTREMITY N/A 02/26/2023   Procedure: ABDOMINAL AORTOGRAM W/LOWER EXTREMITY;  Surgeon: Colleen Libman, MD;  [facility];  Service: Cardiovascular;  Laterality: N/A;   AMPUTATION TOE Left 12/29/2021   Procedure: Incisional DEBRIDEMENT Left Foot with Amputation Left Great Toe;  Surgeon: [name], DPM;  Location: [facility];  Service: Podiatry;  Laterality: Left;   CARDIAC CATHETERIZATION     CATARACT EXTRACTION Bilateral 2013   COLONOSCOPY  11/17/2010   Procedure: COLONOSCOPY;  Surgeon: Colleen Hippo, MD;  Location: [facility];  Service: Endoscopy;  Laterality: N/A;  10:45 am   ESOPHAGOGASTRODUODENOSCOPY  11/17/2010   Procedure: ESOPHAGOGASTRODUODENOSCOPY (EGD);  Surgeon: Colleen Hippo, MD;  Location: [facility];  Service: Endoscopy;  Laterality: N/A;   ESOPHAGOGASTRODUODENOSCOPY N/A 02/16/2015   Procedure: ESOPHAGOGASTRODUODENOSCOPY (EGD);  Surgeon: Colleen Frederick, MD;  Location: [facility] ENDOSCOPY;  Service: Gastroenterology;  Laterality: N/A;   EXPLORATORY LAPAROTOMY  1960s   For peritonitis of undetermined cause   EYE SURGERY     INCISION AND DRAINAGE OF WOUND Right 03/15/2023   Procedure: IRRIGATION AND DEBRIDEMENT WOUND;  Surgeon: [name], DPM;  Location: [facility];  Service: Orthopedics/Podiatry;  Laterality: Right;  Right ankle ulcer debridement, bone biopsy, graft application   LUMBAR EPIDURAL INJECTION  06-2012--06-2013   pt. states she has had 5 epidurals in 06-2012----06-2013   MASTECTOMY Left 1990   PERIPHERAL VASCULAR ATHERECTOMY  08/28/2022   Procedure: PERIPHERAL VASCULAR ATHERECTOMY;  Surgeon: Colleen Libman, MD;  [facility];  Service: Cardiovascular;;  Lt Popliteal and AT   PERIPHERAL VASCULAR  ATHERECTOMY  02/26/2023   Procedure: PERIPHERAL VASCULAR ATHERECTOMY;  Surgeon: Colleen Libman, MD;  [facility];  Service: Cardiovascular;;   PERIPHERAL VASCULAR BALLOON ANGIOPLASTY  08/28/2022   Procedure: PERIPHERAL VASCULAR BALLOON ANGIOPLASTY;  Surgeon: Colleen Libman, MD;  [facility];  Service: Cardiovascular;;  Lt Popliteal and AT   PERIPHERAL VASCULAR BALLOON ANGIOPLASTY  10/16/2022   Procedure: PERIPHERAL VASCULAR BALLOON ANGIOPLASTY;  Surgeon: Colleen Libman, MD;  [facility];  Service: Cardiovascular;;  Rt AT   PERIPHERAL VASCULAR BALLOON ANGIOPLASTY  02/26/2023   Procedure: PERIPHERAL VASCULAR BALLOON ANGIOPLASTY;  Surgeon: Colleen Libman, MD;  [facility];  Service: Cardiovascular;;   PERIPHERAL VASCULAR INTERVENTION Left 12/19/2021   Procedure: PERIPHERAL VASCULAR INTERVENTION;  Surgeon: Colleen Libman, MD;  [facility];  Service: Cardiovascular;  Laterality: Left;  SFA and PTA of POP   YAG LASER APPLICATION Right 05/03/2015   Procedure: YAG LASER  APPLICATION;  Surgeon: Jethro Bolus, MD;  Location: [facility];  Service: Ophthalmology;  Laterality: Right;  right   YAG LASER APPLICATION Left 05/24/2015   Procedure: YAG LASER APPLICATION;  Surgeon: Jethro Bolus, MD;  Location: [facility];  Service: Ophthalmology;  Laterality: Left;    Allergies  Allergen Reactions   Iodine Rash and Other (See Comments)    BETADINE, blistering rash/burns   Iohexol Rash and Other (See Comments)    Blisters; PT NEEDS 13-HOUR PREP    Petroleum Gauze Non-Woven 3x9" [Wound Dressings] Other (See Comments)    BURNING SENSATION, RED SKIN   Sulfa Antibiotics Nausea And Vomiting   Wound Dressing Adhesive Other (See Comments)    Burns skin   Tape Itching, Rash and Other (See Comments)    "DO NOT USE ADHESIVE TAPE" Not even a Band Aid" NO PAPER TAPE Burns skin Skin is very very thin    Current Outpatient Medications   Medication Sig Dispense Refill   acetaminophen (TYLENOL) 325 MG tablet Take 2 tablets (650 mg total) by mouth every 6 (six) hours as needed for mild pain (pain score 1-3) (or Fever >/= 101). 30 tablet 0   albuterol (VENTOLIN HFA) 108 (90 Base) MCG/ACT inhaler Inhale 2 puffs into the lungs every 6 (six) hours as needed for wheezing or shortness of breath. 18 g 1   AMBULATORY NON FORMULARY MEDICATION Dispense one Readi Steadi glove 1 Units 0   amiodarone (PACERONE) 200 MG tablet Take 0.5 tablets (100 mg total) by mouth every other day. (Patient taking differently: Take 200 mg by mouth 2 (two) times daily.)     apixaban (ELIQUIS) 2.5 MG TABS tablet Take 1 tablet (2.5 mg total) by mouth 2 (two) times daily. 180 tablet 1   ascorbic acid (VITAMIN C) 500 MG tablet Take 500 mg by mouth in the morning.     CALCIUM PO Take 1,200 mg by mouth 2 (two) times daily.     Camphor-Menthol-Methyl Sal (SALONPAS) 3.04-14-08 % PTCH Place 1 patch onto the skin daily as needed (pain.).     cephALEXin (KEFLEX) 500 MG capsule TAKE ONE CAPSULE BY MOUTH TWICE DAILY FOR FIVE DAYS. (Patient not taking: Reported on 03/27/2023)     Cholecalciferol (VITAMIN D-3) 25 MCG (1000 UT) CAPS Take 1,000 Units by mouth 2 (two) times daily.     clopidogrel (PLAVIX) 75 MG tablet TAKE 1 TABLET EVERY MORNING (Patient taking differently: Take 75 mg by mouth once.) 90 tablet 2   denosumab (PROLIA) 60 MG/ML SOSY injection Inject 60 mg into the skin every 6 (six) months.     doxycycline (VIBRA-TABS) 100 MG tablet Take 1 tablet by mouth 2 (two) times daily. (Patient not taking: Reported on 03/27/2023)     Ferrous Sulfate (IRON PO) Take 65 mg by mouth at bedtime.     furosemide (LASIX) 20 MG tablet Take 2 tablets (40 mg total) by mouth daily. 120 tablet 3   linezolid (ZYVOX) 600 MG tablet Take 1 tablet (600 mg total) by mouth 2 (two) times daily. 60 tablet 0   MAGNESIUM OXIDE PO Take 500 mg by mouth every morning.     metoprolol tartrate (LOPRESSOR)  25 MG tablet Take 3 tablets (75 mg total) by mouth 2 (two) times daily. 540 tablet 2   Multiple Vitamin (MULTIVITAMIN WITH MINERALS) TABS tablet Take 1 tablet by mouth every morning.     oxyCODONE-acetaminophen (PERCOCET) 5-325 MG tablet Take 1 tablet by mouth every 4 (four) hours as needed for severe  pain (pain score 7-10). 20 tablet 0   pantoprazole (PROTONIX) 40 MG tablet Take 1 tablet (40 mg total) by mouth daily. 90 tablet 2   potassium chloride (KLOR-CON M) 10 MEQ tablet Take 2 tablets (20 mEq total) by mouth daily. (Patient taking differently: Take 10 mEq by mouth daily.) 120 tablet 3   pravastatin (PRAVACHOL) 40 MG tablet TAKE 1 TABLET DAILY (Patient taking differently: Take 40 mg by mouth every evening.) 90 tablet 3   SANTYL 250 UNIT/GM ointment APPLY TO AFFECTED AREAS ONCE DAILY. (Patient taking differently: Apply 1 Application topically 2 (two) times daily. Apply as directed for bandage changes) 30 g 0   umeclidinium bromide (INCRUSE ELLIPTA) 62.5 MCG/ACT AEPB Inhale 1 puff into the lungs daily. 90 each 3   vancomycin IVPB Inject 1,250 mg into the vein daily for 12 days. Indication:  R-ankle infection First Dose: Yes Last Day of Therapy:  04/01/23 Labs - Sunday/Monday:  CBC/D, BMP, and vancomycin trough. Labs - Thursday:  BMP and vancomycin trough Labs - Once weekly: ESR and CRP Method of administration:Elastomeric Method of administration may be changed at the discretion of the patient and/or caregiver's ability to self-administer the medication ordered. (Patient taking differently: Inject 1,000 mg into the vein daily. Indication:  R-ankle infection First Dose: Yes Last Day of Therapy:  04/01/23 Labs - Sunday/Monday:  CBC/D, BMP, and vancomycin trough. Labs - Thursday:  BMP and vancomycin trough Labs - Once weekly: ESR and CRP Method of administration:Elastomeric Method of administration may be changed at the discretion of the patient and/or caregiver's ability to self-administer  the medication ordered.) 12 Units 0   No current facility-administered medications for this visit.    Family History  Problem Relation Age of Onset   Alzheimer's disease Mother    Dementia Mother    Hypertension Mother    Hyperlipidemia Mother    Parkinsonism Father     Social History   Socioeconomic History   Marital status: Married    Spouse name: Not on file   Number of children: 2   Years of education: Not on file   Highest education level: Not on file  Occupational History   Occupation: Retired    Associate Professor: RETIRED  Tobacco Use   Smoking status: Former    Current packs/day: 0.00    Average packs/day: 1 pack/day for 56.0 years (56.0 ttl pk-yrs)    Types: Cigarettes    Start date: 08/14/1950    Quit date: 08/14/2006    Years since quitting: 16.6    Passive exposure: Never   Smokeless tobacco: Never   Tobacco comments:    quit in 2008  Vaping Use   Vaping status: Never Used  Substance and Sexual Activity   Alcohol use: No    Alcohol/week: 0.0 standard drinks of alcohol   Drug use: No   Sexual activity: Never  Other Topics Concern   Not on file  Social History Narrative   Right hnaded   Social Drivers of Health   Financial Resource Strain: Not on file  Food Insecurity: No Food Insecurity (11/17/2022)   Hunger Vital Sign    Worried About Running Out of Food in the Last Year: Never true    Ran Out of Food in the Last Year: Never true  Transportation Needs: No Transportation Needs (11/17/2022)   PRAPARE - Administrator, Civil Service (Medical): No    Lack of Transportation (Non-Medical): No  Physical Activity: Not on file  Stress: Not on  file  Social Connections: Unknown (08/29/2022)   [facility] from [facility]   Social Network    Social Network: Not on file  Intimate Partner Violence: Not At Risk (11/17/2022)   Humiliation, Afraid, Rape, and Kick questionnaire    Fear of Current or Ex-Partner: No    Emotionally Abused: No     Physically Abused: No    Sexually Abused: No     REVIEW OF SYSTEMS:   [X]  denotes positive finding, [ ]  denotes negative finding Cardiac  Comments:  Chest pain or chest pressure:    Shortness of breath upon exertion:    Short of breath when lying flat:    Irregular heart rhythm:        Vascular    Pain in calf, thigh, or hip brought on by ambulation:    Pain in feet at night that wakes you up from your sleep:     Blood clot in your veins:    Leg swelling:  x       Pulmonary    Oxygen at home:    Productive cough:     Wheezing:         Neurologic    Sudden weakness in arms or legs:     Sudden numbness in arms or legs:     Sudden onset of difficulty speaking or slurred speech:    Temporary loss of vision in one eye:     Problems with dizziness:         Gastrointestinal    Blood in stool:     Vomited blood:         Genitourinary    Burning when urinating:     Blood in urine:        Psychiatric    Major depression:         Hematologic    Bleeding problems:    Problems with blood clotting too easily:        Skin    Rashes or ulcers:        Constitutional    Fever or chills:      PHYSICAL EXAMINATION:  Today's Vitals   04/01/23 1117  BP: (!) 177/74  Pulse: 92  Resp: 20  Temp: (!) 96.8 F (36 C)  TempSrc: Temporal  SpO2: 90%  Weight: 90 lb (40.8 kg)  Height: 5\' 3"  (1.6 m)   Body mass index is 15.94 kg/m.   General:  WDWN in NAD; vital signs documented above Gait: Not observed HENT: WNL, normocephalic Pulmonary: normal non-labored breathing , without wheezing Cardiac: regular HR, with carotid bruit on the left Abdomen: soft, NT Skin: without rashes Vascular Exam/Pulses: Right foot wrapped as dressing was changed this am.  Left foot with brisk doppler flow AT/peroneal; palpable radial pulses bilaterally Extremities: without ischemic changes, without Gangrene , without cellulitis; without open wounds left foot amputations well healed.    Musculoskeletal: no muscle wasting or atrophy  Neurologic: A&O X 3 Psychiatric:  The pt has Normal affect.   Non-Invasive Vascular Imaging:   ABI's/TBI's on 04/01/2023: Right:  1.19/1.10 - Great toe pressure: 195 Left:  0.65/0.51 - Great toe pressure: 91  Arterial duplex on 04/01/2023: +-----------+--------+-----+--------+--------+--------+  RIGHT     PSV cm/sRatioStenosisWaveformComments  +-----------+--------+-----+--------+--------+--------+  CFA Distal 208                  biphasic          +-----------+--------+-----+--------+--------+--------+  DFA       137  biphasic          +-----------+--------+-----+--------+--------+--------+  SFA Prox   218                  biphasic          +-----------+--------+-----+--------+--------+--------+  SFA Mid    101                  biphasic          +-----------+--------+-----+--------+--------+--------+  SFA Distal 40                   biphasic          +-----------+--------+-----+--------+--------+--------+  POP Prox   53                   biphasic          +-----------+--------+-----+--------+--------+--------+  POP Distal 49                   biphasic          +-----------+--------+-----+--------+--------+--------+  ATA Distal 119                  biphasic          +-----------+--------+-----+--------+--------+--------+  PTA Distal 0            occluded                  +-----------+--------+-----+--------+--------+--------+  PERO Distal50                   biphasic          +-----------+--------+-----+--------+--------+--------+       Right Stent(s):  +---------------+--------+--------+---------+--------+  Left SFA       PSV cm/sStenosisWaveform Comments  +---------------+--------+--------+---------+--------+  Prox to Stent  103             triphasic          +---------------+--------+--------+---------+--------+   Proximal Stent 84              biphasic           +---------------+--------+--------+---------+--------+  Mid Stent      44              biphasic           +---------------+--------+--------+---------+--------+  Distal Stent   25              biphasic           +---------------+--------+--------+---------+--------+  Distal to Stent51              biphasic           +---------------+--------+--------+---------+--------+   Patent stent         +-----------+--------+-----+--------+---------+--------+  LEFT      PSV cm/sRatioStenosisWaveform Comments  +-----------+--------+-----+--------+---------+--------+  CFA Distal 103                  triphasic          +-----------+--------+-----+--------+---------+--------+  DFA       167                  biphasic           +-----------+--------+-----+--------+---------+--------+  SFA Prox   84                   biphasic           +-----------+--------+-----+--------+---------+--------+  SFA Mid    44  biphasic           +-----------+--------+-----+--------+---------+--------+  SFA Distal 25                   biphasic           +-----------+--------+-----+--------+---------+--------+  POP Prox   77                   biphasic           +-----------+--------+-----+--------+---------+--------+  POP Distal 35                   biphasic           +-----------+--------+-----+--------+---------+--------+  ATA Distal 36                   biphasic           +-----------+--------+-----+--------+---------+--------+  PTA Distal 0            occluded                   +-----------+--------+-----+--------+---------+--------+  PERO Distal41                   biphasic           +-----------+--------+-----+--------+---------+--------+     Summary:  Right:  - Patent femoral-popliteal arteries with diffuse atherosclerotic disease  throughout and no focal stenosis.  - Occlusion of the posterior tibial artery.  Left:  - Patent superficial femoral artery stent and no focal stenosis.  - Occlusion of the posterior tibial artery.   Previous ABI's/TBI's on 02/12/2023: Right:  0.87/0.32 - Great toe pressure: 60 Left:  1.03 - Great toe pressure:  dampened   Previous arterial duplex on 02/12/2023: +----------+--------+-----+---------------+----------+--------+  RIGHT    PSV cm/sRatioStenosis       Waveform  Comments  +----------+--------+-----+---------------+----------+--------+  CFA Distal153                         triphasic           +----------+--------+-----+---------------+----------+--------+  DFA      129                         biphasic            +----------+--------+-----+---------------+----------+--------+  SFA Prox  56                          biphasic            +----------+--------+-----+---------------+----------+--------+  SFA Mid   304     3.7  50-74% stenosisbiphasic            +----------+--------+-----+---------------+----------+--------+  SFA Distal345     3.1  50-74% stenosismonophasic          +----------+--------+-----+---------------+----------+--------+  POP Distal47                          monophasic          +----------+--------+-----+---------------+----------+--------+  TP Trunk  199     4.4  50-74% stenosismonophasic          +----------+--------+-----+---------------+----------+--------+  PTA Prox  61                          monophasic          +----------+--------+-----+---------------+----------+--------+     ASSESSMENT/PLAN::  82 y.o. female here for follow up for PAD with hx of   -angiography with stent to the left SFA and drug coated balloon angioplasty of the left popliteal artery by Dr. Myra [name] on 12/19/2021.   -subsequent left great toe ampuation by Dr. Allena [name] on 12/29/2021.   -angiogram with laser atherectomy and  angioplasty of left popliteal and left ATA for left leg ulcer on 08/28/2022 Dr. Myra [name] -angiogram with laser atherectomy and drug coated balloon angioplasty of the right SFA and popliteal artery, angioplasty of the right ATA and peroneal arteries for right leg ulcer on 02/26/2023 by Dr. Myra [name] - irrigation and debridement of ulceration to subcu fat level, bone bx and application of dermal allograft on 03/15/2023 by Dr. Annamary [name]  -pt ABI & toe pressure on the right much improved and should be able to heal skin graft.  Given the swelling in the right leg, encouraged her husband to wrap up to the knee with mild to moderate compression and continue elevation.  ABI on the left has dropped but she is not having any issues.  She does not have any rest pain, claudication in either leg.   -she has 2 weeks left of po abx (she is finishing up her IV abx) -continue statin/plavix.  She is on Eliquis for afib.  -pt will f/u in 3 months with BLE arterial duplex and ABI.  She is also scheduled for carotid duplex at that time, which we will get when she comes for the above.     Doreatha Massed, [facility] [phone]  Clinic MD:   [name]

## 2023-04-15 ENCOUNTER — Other Ambulatory Visit: Payer: Self-pay | Admitting: Internal Medicine

## 2023-04-15 ENCOUNTER — Telehealth: Payer: Self-pay

## 2023-04-15 NOTE — Telephone Encounter (Signed)
 Auth Submission: APPROVED Site of care: Site of care: [facility] Payer: [employer] medicare Medication & CPT/J Code(s) submitted: Prolia  (Denosumab ) [id number] Route of submission (phone, fax, portal): portal Phone # Fax # Auth type: Buy/Bill PB Units/visits requested: 60mg , q37months Reference number: [id number] Approval from: 04/15/23 to 11/11/23

## 2023-04-16 ENCOUNTER — Ambulatory Visit (INDEPENDENT_AMBULATORY_CARE_PROVIDER_SITE_OTHER): Payer: Medicare HMO | Admitting: Podiatry

## 2023-04-16 ENCOUNTER — Encounter: Payer: Self-pay | Admitting: Podiatry

## 2023-04-16 ENCOUNTER — Other Ambulatory Visit: Payer: Self-pay

## 2023-04-16 VITALS — Ht 63.0 in | Wt 90.0 lb

## 2023-04-16 DIAGNOSIS — L97322 Non-pressure chronic ulcer of left ankle with fat layer exposed: Secondary | ICD-10-CM

## 2023-04-16 DIAGNOSIS — Z9889 Other specified postprocedural states: Secondary | ICD-10-CM | POA: Diagnosis not present

## 2023-04-16 DIAGNOSIS — I739 Peripheral vascular disease, unspecified: Secondary | ICD-10-CM

## 2023-04-16 DIAGNOSIS — I6523 Occlusion and stenosis of bilateral carotid arteries: Secondary | ICD-10-CM

## 2023-04-16 MED ORDER — SILVER SULFADIAZINE 1 % EX CREA: 1.0000 | TOPICAL_CREAM | Freq: Every day | CUTANEOUS | 0 refills | Status: DC

## 2023-04-16 NOTE — Progress Notes (Signed)
  Subjective:  Patient ID: [name], female    DOB: 1940-12-06,  MRN: [id number]  Chief Complaint  Patient presents with   Wound Check    Patient is here for right ankle ulcer inner and outer, looking better, still part oozing and scabbed up    DOS: 03/15/2023 Procedure: Right ankle ulcer debridement with prep for graft, bone biopsies distal fibula, application of arthroflex dermal allograft  83 y.o. female returns for post-op check.  Patient is approximately 1 month status post above procedures.  She reports she is doing well.  Denies any redness or swelling in the right ankle.  Has been doing dry dressing changes.  Has not seen any drainage from wounds.  Review of Systems: Negative except as noted in the HPI. Denies N/V/F/Ch.   Objective:  There were no vitals filed for this visit. Body mass index is 15.94 kg/m. Constitutional Well developed. Well nourished.  Vascular Foot warm and well perfused. Capillary refill normal to all digits.  Calf is soft and supple, no posterior calf or knee pain, negative Homans' sign  Neurologic Normal speech. Oriented to person, place, and time. Epicritic sensation to light touch grossly present bilaterally.  Dermatologic Extensive scabbing and desiccation of the grafts on the lateral ankle.  Upon removal there was healthy granular tissue underlying the graft.  No evidence of erythema surrounding.  Painful to touch.  Medial wound smaller with healthy granular tissue in the base.         Orthopedic: Tenderness to palpation noted about the surgical site.   Multiple view plain film radiographs: Deferred   Path: Ulceration sent for pathology showed ulceration with acute inflammation and granulation tissue.  No specific diagnosis given as to any type of abnormal ulceration.  Bone biopsy right ankle distal fibula resulted with benign bone and connective tissue no inflammatory changes were identified.  Culture: Corynebacterium stratum, group A  strep Assessment:   1. Ankle ulcer, left, with fat layer exposed (HCC)   2. Post-operative state    Plan:  Patient was evaluated and treated and all questions answered.  S/p foot surgery right ankle ulcer debridement with prep for graft fibular bone biopsy and dermal allograft application -Progressing as expected post-operatively.  Wound seems improved especially medially.  Removed the arthro-Flex graft from the right lateral ankle and there was healthy underlying tissue present however wound is still present. -Continue local wound care with Silvadene  cream to the right lateral ankle ulceration once daily and dressed with nonadherent gauze dry gauze and gauze roll -XR: Deferred -WB Status: Weightbearing as tolerated in surgical shoe -Sutures: Removed remaining dermal allograft from the right lateral ankle at this time -Medications: ABX per infectious disease appreciate recommendations patient has been on linezolid  -Foot redressed.  No follow-ups on file.         [name], DPM [facility] / [facility]

## 2023-04-17 ENCOUNTER — Ambulatory Visit: Payer: Medicare HMO | Admitting: Internal Medicine

## 2023-04-17 ENCOUNTER — Other Ambulatory Visit: Payer: Self-pay | Admitting: Podiatry

## 2023-04-17 ENCOUNTER — Other Ambulatory Visit: Payer: Self-pay

## 2023-04-17 ENCOUNTER — Encounter: Payer: Self-pay | Admitting: Internal Medicine

## 2023-04-17 ENCOUNTER — Telehealth: Payer: Self-pay | Admitting: Podiatry

## 2023-04-17 VITALS — BP 139/78 | HR 64 | Resp 16 | Ht 63.0 in | Wt 90.0 lb

## 2023-04-17 DIAGNOSIS — B370 Candidal stomatitis: Secondary | ICD-10-CM | POA: Diagnosis not present

## 2023-04-17 DIAGNOSIS — M86671 Other chronic osteomyelitis, right ankle and foot: Secondary | ICD-10-CM

## 2023-04-17 MED ORDER — NYSTATIN 100000 UNIT/ML MT SUSP: 5.0000 mL | Freq: Four times a day (QID) | OROMUCOSAL | 0 refills | Status: DC

## 2023-04-17 MED ORDER — MUPIROCIN 2 % EX OINT: 1.0000 | TOPICAL_OINTMENT | Freq: Every day | CUTANEOUS | 0 refills | Status: DC

## 2023-04-17 NOTE — Progress Notes (Signed)
 RFV: right chronic foot osteo Patient ID: [name], female   DOB: [birth year], 83 y.o.   MRN: [id number]  HPI [name] is POD #3 from right ankle debridement, bone biopsy and application of allograft in the setting of chronic right lateral ankle ulceration with cellulitis. Surgical cultures are growing Group A Streptococcus and Corynebacterium striatum on 12/6 OR cultures. Will change daptomycin  to vancomycin  secondary to better coverage reliability with vancomycin . Discussed plan of care for 2 weeks of IV vancomycin  followed by linezolid . Which she is tolerating  Outpatient Encounter Medications as of 04/17/2023  Medication Sig   acetaminophen  (TYLENOL ) 325 MG tablet Take 2 tablets (650 mg total) by mouth every 6 (six) hours as needed for mild pain (pain score 1-3) (or Fever >/= 101).   albuterol  (VENTOLIN  HFA) 108 (90 Base) MCG/ACT inhaler Inhale 2 puffs into the lungs every 6 (six) hours as needed for wheezing or shortness of breath.   AMBULATORY NON FORMULARY MEDICATION Dispense one Readi Steadi glove   amiodarone  (PACERONE ) 200 MG tablet Take 0.5 tablets (100 mg total) by mouth every other day. (Patient taking differently: Take 200 mg by mouth 2 (two) times daily.)   apixaban  (ELIQUIS ) 2.5 MG TABS tablet Take 1 tablet (2.5 mg total) by mouth 2 (two) times daily.   ascorbic acid  (VITAMIN C ) 500 MG tablet Take 500 mg by mouth in the morning.   CALCIUM  PO Take 1,200 mg by mouth 2 (two) times daily.   Camphor-Menthol-Methyl Sal (SALONPAS) 3.04-14-08 % PTCH Place 1 patch onto the skin daily as needed (pain.).   cephALEXin  (KEFLEX ) 500 MG capsule    Cholecalciferol (VITAMIN D-3) 25 MCG (1000 UT) CAPS Take 1,000 Units by mouth 2 (two) times daily.   clopidogrel  (PLAVIX ) 75 MG tablet TAKE 1 TABLET EVERY MORNING (Patient taking differently: Take 75 mg by mouth once.)   denosumab  (PROLIA ) 60 MG/ML SOSY injection Inject 60 mg into the skin every 6 (six) months.   doxycycline  (VIBRA -TABS) 100 MG tablet  Take 1 tablet by mouth 2 (two) times daily.   Ferrous Sulfate  (IRON PO) Take 65 mg by mouth at bedtime.   furosemide  (LASIX ) 20 MG tablet Take 2 tablets (40 mg total) by mouth daily.   linezolid  (ZYVOX ) 600 MG tablet Take 1 tablet (600 mg total) by mouth 2 (two) times daily.   MAGNESIUM  OXIDE PO Take 500 mg by mouth every morning.   metoprolol  tartrate (LOPRESSOR ) 25 MG tablet Take 3 tablets (75 mg total) by mouth 2 (two) times daily.   Multiple Vitamin (MULTIVITAMIN WITH MINERALS) TABS tablet Take 1 tablet by mouth every morning.   mupirocin  ointment (BACTROBAN ) 2 % Apply 1 Application topically daily.   oxyCODONE -acetaminophen  (PERCOCET) 5-325 MG tablet Take 1 tablet by mouth every 4 (four) hours as needed for severe pain (pain score 7-10).   pantoprazole  (PROTONIX ) 40 MG tablet Take 1 tablet (40 mg total) by mouth daily.   potassium chloride  (KLOR-CON  M) 10 MEQ tablet Take 2 tablets (20 mEq total) by mouth daily. (Patient taking differently: Take 10 mEq by mouth daily.)   pravastatin  (PRAVACHOL ) 40 MG tablet TAKE 1 TABLET DAILY (Patient taking differently: Take 40 mg by mouth every evening.)   SANTYL  250 UNIT/GM ointment APPLY TO AFFECTED AREAS ONCE DAILY. (Patient taking differently: Apply 1 Application topically 2 (two) times daily. Apply as directed for bandage changes)   silver  sulfADIAZINE  (SILVADENE ) 1 % cream Apply 1 Application topically daily.   umeclidinium bromide  (INCRUSE ELLIPTA ) 62.5 MCG/ACT AEPB  Inhale 1 puff into the lungs daily.   No facility-administered encounter medications on file as of 04/17/2023.     Patient Active Problem List   Diagnosis Date Noted   Wound infection 03/18/2023   Cellulitis of right lower extremity 03/14/2023   Skin ulcer of right ankle with fat layer exposed (HCC) 03/14/2023   Sepsis (HCC) 03/13/2023   CHF, acute on chronic (HCC) 11/17/2022   CKD stage 3b, GFR 30-44 ml/min (HCC) 11/17/2022   D-dimer, elevated 11/17/2022   Acute respiratory  failure with hypoxia (HCC) 06/18/2022   Aspiration pneumonitis (HCC) 06/18/2022   Acute on chronic HFrEF (heart failure with reduced ejection fraction) (HCC) 06/18/2022   Hypercoagulable state due to persistent atrial fibrillation (HCC) 06/11/2022   Acute heart failure with preserved ejection fraction (HFpEF) (HCC) 05/24/2022   Cellulitis and abscess of toe of left foot 03/07/2022   GERD (gastroesophageal reflux disease) 03/06/2022   Atypical atrial flutter (HCC)    Sepsis due to undetermined organism (HCC) 12/25/2021   Diarrhea 12/25/2021   Hyperkalemia 12/25/2021   Osteomyelitis of great toe of left foot (HCC) 12/25/2021   PAD (peripheral artery disease) (HCC) 12/19/2021   Osteoporosis    Tibial plateau fracture, left approx. 05/10/17 06/27/2017   COPD, moderate (HCC) 07/12/2015   Multiple gastric ulcers    Coagulopathy (HCC)    Bleeding gastrointestinal    Acute GI bleeding    CAD in native artery    Supratherapeutic INR    Right lower lobe lung mass    Cavitary lesion of lung    AKI (acute kidney injury) (HCC)    Chronic back pain    GI bleed 02/14/2015   Carotid artery disease (HCC) 02/14/2015   Leukocytosis 02/14/2015   Lung nodules 09/29/2014   Smoking history 09/29/2014   Pneumonia with cavity of lung 07/09/2014   Lung mass 07/09/2014   Thrombocytosis (HCC) 05/21/2014   Protein-calorie malnutrition, moderate (HCC) 05/21/2014   Anemia    Generalized weakness    Necrotic pneumonia (HCC) 05/20/2014   Pneumonia 05/20/2014   Elevated liver enzymes 12/16/2013   Tremor 06/24/2013   Encounter for therapeutic drug monitoring 05/18/2013   Dizziness 01/12/2013   Wound of ankle 10/23/2010   Long term current use of anticoagulant 07/05/2010   Carotid bruit 11/01/2009   Hyperlipidemia 10/26/2009   Essential hypertension 10/26/2009   Persistent atrial fibrillation (HCC) 10/26/2009   ACUTE ON CHRONIC DIASTOLIC HEART FAILURE 10/26/2009   SYNCOPE AND COLLAPSE 10/26/2009      Health Maintenance Due  Topic Date Due   Medicare Annual Wellness (AWV)  Never done   Zoster Vaccines- Shingrix (2 of 2) 03/07/2017   DTaP/Tdap/Td (2 - Td or Tdap) 07/31/2021   COVID-19 Vaccine (4 - 2024-25 season) 03/19/2023     Review of Systems 12 point ros is negative Physical Exam   BP 139/78   Pulse 64   Resp 16   Ht 5' 3 (1.6 m)   Wt 90 lb (40.8 kg)   BMI 15.94 kg/m    Physical Exam  Constitutional:  oriented to person, place, and time. appears well-developed and well-nourished. No distress.  HENT: [place]/AT, PERRLA, no scleral icterus Mouth/Throat: Oropharynx is clear and moist. No oropharyngeal exudate.  Zku:tnlwi unchanged to right ankle Skin: Skin is warm and dry. No rash noted. No erythema.  Psychiatric: a normal mood and affect.  behavior is normal.    CBC Lab Results  Component Value Date   WBC 16.6 (H) 03/20/2023   RBC  3.16 (L) 03/20/2023   HGB 10.2 (L) 03/20/2023   HCT 32.2 (L) 03/20/2023   PLT 383 03/20/2023   MCV 101.9 (H) 03/20/2023   MCH 32.3 03/20/2023   MCHC 31.7 03/20/2023   RDW 17.5 (H) 03/20/2023   LYMPHSABS 1.0 03/13/2023   MONOABS 0.8 03/13/2023   EOSABS 0.0 03/13/2023    BMET Lab Results  Component Value Date   NA 137 03/20/2023   K 3.9 03/20/2023   CL 105 03/20/2023   CO2 20 (L) 03/20/2023   GLUCOSE 118 (H) 03/20/2023   BUN 27 (H) 03/20/2023   CREATININE 1.43 (H) 03/20/2023   CALCIUM  8.7 (L) 03/20/2023   GFRNONAA 36 (L) 03/20/2023   GFRAA >60 02/20/2015     Assessment and Plan  Chronic osteomyelitis to right ankle = Will check sed rate and crp and cbc; continue on linezolid  for 2 more weeks  Right ankle wound=  Continue with wound care  Thrush- will do a trial of fluconazole

## 2023-04-17 NOTE — Telephone Encounter (Signed)
 This Patients Bulah) spouse Vilinda) called in regards to a medication you prescribed to the patient. He stated she is allergic to the medication because it is a sulfa  based drug and that it is noted throughout her chart. He would like it if the nurse or yourself would reach out or send in a different medication. Upon visiting this morning the [facility] nurse suggested you send hydrogel.

## 2023-04-17 NOTE — Progress Notes (Signed)
 Rx for mupriocin sent

## 2023-04-18 LAB — CBC WITH DIFFERENTIAL/PLATELET
Absolute Lymphocytes: 819 {cells}/uL — ABNORMAL LOW (ref 850–3900)
Absolute Monocytes: 119 {cells}/uL — ABNORMAL LOW (ref 200–950)
Basophils Absolute: 70 {cells}/uL (ref 0–200)
Basophils Relative: 1 %
Eosinophils Absolute: 91 {cells}/uL (ref 15–500)
Eosinophils Relative: 1.3 %
HCT: 31.1 % — ABNORMAL LOW (ref 35.0–45.0)
Hemoglobin: 10.2 g/dL — ABNORMAL LOW (ref 11.7–15.5)
MCH: 32.2 pg (ref 27.0–33.0)
MCHC: 32.8 g/dL (ref 32.0–36.0)
MCV: 98.1 fL (ref 80.0–100.0)
MPV: 10.9 fL (ref 7.5–12.5)
Monocytes Relative: 1.7 %
Neutro Abs: 5901 {cells}/uL (ref 1500–7800)
Neutrophils Relative %: 84.3 %
Platelets: 149 10*3/uL (ref 140–400)
RBC: 3.17 10*6/uL — ABNORMAL LOW (ref 3.80–5.10)
RDW: 14.5 % (ref 11.0–15.0)
Total Lymphocyte: 11.7 %
WBC: 7 10*3/uL (ref 3.8–10.8)

## 2023-04-18 LAB — SEDIMENTATION RATE: Sed Rate: 63 mm/h — ABNORMAL HIGH (ref 0–30)

## 2023-04-18 LAB — C-REACTIVE PROTEIN: CRP: 4.8 mg/L (ref <8.0)

## 2023-05-07 ENCOUNTER — Ambulatory Visit (INDEPENDENT_AMBULATORY_CARE_PROVIDER_SITE_OTHER): Payer: Medicare HMO | Admitting: Podiatry

## 2023-05-07 DIAGNOSIS — L97312 Non-pressure chronic ulcer of right ankle with fat layer exposed: Secondary | ICD-10-CM

## 2023-05-07 NOTE — Progress Notes (Signed)
  Subjective:  Patient ID: [name], female    DOB: 03/21/1941,  MRN: [id number]  Chief Complaint  Patient presents with   Wound Check    Return in about 3 weeks (around 05/07/2023) for f/u R lateral ankle ulcer.    DOS: 03/15/2023 Procedure: Right ankle ulcer debridement with prep for graft, bone biopsies distal fibula, application of arthroflex dermal allograft  83 y.o. female returns for post-op check.  Patient is approximately 1.5 month status post above procedures.   Patient was last seen 3 years ago.  They have noticed increased improvement in wound healing on the right lateral ankle ulceration.  Denies redness or swelling has had more mobility and more related to walk without pain.  Review of Systems: Negative except as noted in the HPI. Denies N/V/F/Ch.   Objective:  There were no vitals filed for this visit. There is no height or weight on file to calculate BMI. Constitutional Well developed. Well nourished.  Vascular Foot warm and well perfused. Capillary refill normal to all digits.  Calf is soft and supple, no posterior calf or knee pain, negative Homans' sign  Neurologic Normal speech. Oriented to person, place, and time. Epicritic sensation to light touch grossly present bilaterally.  Dermatologic Ulceration the right lateral ankle is shrinking with decreased depth as well as healing and side-to-side.  Much improved from prior.  No erythema or drainage noted.  There was noted to be some hyperkeratotic tissue/eschar surrounding the wound.   Orthopedic: Tenderness to palpation noted about the surgical site.   Multiple view plain film radiographs: Deferred   Path: Ulceration sent for pathology showed ulceration with acute inflammation and granulation tissue.  No specific diagnosis given as to any type of abnormal ulceration.  Bone biopsy right ankle distal fibula resulted with benign bone and connective tissue no inflammatory changes were identified.  Culture:  Corynebacterium stratum, group A strep Assessment:   1. Ankle ulcer, right, with fat layer exposed (HCC)     Plan:  Patient was evaluated and treated and all questions answered.  S/p foot surgery right ankle ulcer debridement with prep for graft fibular bone biopsy and dermal allograft application -Progressing as expected post-operatively wound is healing at this time -Continue local wound care with mupirocin ointment as this has been working. -XR: Deferred -WB Status: Weightbearing as tolerated in regular shoe -Sutures: None present -Medications: ABX per infectious disease.  Patient has finished course of linezolid.  No further antibiotics needed at this time -Foot redressed with antibiotic med dressing.  Return in about 3 weeks (around 05/28/2023).         Corinna Gab, DPM [facility] / [facility]

## 2023-05-08 ENCOUNTER — Encounter: Payer: Self-pay | Admitting: Internal Medicine

## 2023-05-08 ENCOUNTER — Other Ambulatory Visit: Payer: Self-pay

## 2023-05-08 ENCOUNTER — Ambulatory Visit: Payer: Medicare HMO | Admitting: Internal Medicine

## 2023-05-08 DIAGNOSIS — I10 Essential (primary) hypertension: Secondary | ICD-10-CM | POA: Diagnosis not present

## 2023-05-08 DIAGNOSIS — Z87891 Personal history of nicotine dependence: Secondary | ICD-10-CM | POA: Diagnosis not present

## 2023-05-08 DIAGNOSIS — M86671 Other chronic osteomyelitis, right ankle and foot: Secondary | ICD-10-CM

## 2023-05-08 NOTE — Progress Notes (Signed)
 Patient ID: [name], female   DOB: [birth year], 83 y.o.   MRN: [id number]  HPI Chronic osteo. Extended linezolid through 05/01/23. Now off of abtx. Wound care yesterday. Thought it was improving.  No longer having thrush -- had poor po intake for about a week. Since she didn't tolerate fluconazole? Vs. Linezolid due to worsening nausea. Poor po intake  Has lateral malleous lesion and medial small lesion - still healling. Not as deep  Outpatient Encounter Medications as of 05/08/2023  Medication Sig   acetaminophen (TYLENOL) 325 MG tablet Take 2 tablets (650 mg total) by mouth every 6 (six) hours as needed for mild pain (pain score 1-3) (or Fever >/= 101).   albuterol (VENTOLIN HFA) 108 (90 Base) MCG/ACT inhaler Inhale 2 puffs into the lungs every 6 (six) hours as needed for wheezing or shortness of breath.   AMBULATORY NON FORMULARY MEDICATION Dispense one Readi Steadi glove   amiodarone (PACERONE) 200 MG tablet Take 0.5 tablets (100 mg total) by mouth every other day. (Patient taking differently: Take 200 mg by mouth 2 (two) times daily.)   apixaban (ELIQUIS) 2.5 MG TABS tablet Take 1 tablet (2.5 mg total) by mouth 2 (two) times daily.   ascorbic acid (VITAMIN C) 500 MG tablet Take 500 mg by mouth in the morning.   CALCIUM PO Take 1,200 mg by mouth 2 (two) times daily.   Camphor-Menthol-Methyl Sal (SALONPAS) 3.04-14-08 % PTCH Place 1 patch onto the skin daily as needed (pain.).   Cholecalciferol (VITAMIN D-3) 25 MCG (1000 UT) CAPS Take 1,000 Units by mouth 2 (two) times daily.   clopidogrel (PLAVIX) 75 MG tablet TAKE 1 TABLET EVERY MORNING (Patient taking differently: Take 75 mg by mouth once.)   denosumab (PROLIA) 60 MG/ML SOSY injection Inject 60 mg into the skin every 6 (six) months.   doxycycline (VIBRA-TABS) 100 MG tablet Take 1 tablet by mouth 2 (two) times daily.   Ferrous Sulfate (IRON PO) Take 65 mg by mouth at bedtime.   furosemide (LASIX) 20 MG tablet Take 2 tablets (40 mg  total) by mouth daily.   MAGNESIUM OXIDE PO Take 500 mg by mouth every morning.   metoprolol tartrate (LOPRESSOR) 25 MG tablet Take 3 tablets (75 mg total) by mouth 2 (two) times daily.   Multiple Vitamin (MULTIVITAMIN WITH MINERALS) TABS tablet Take 1 tablet by mouth every morning.   mupirocin ointment (BACTROBAN) 2 % Apply 1 Application topically daily.   nystatin (MYCOSTATIN) 100000 UNIT/ML suspension Take 5 mLs (500,000 Units total) by mouth 4 (four) times daily.   oxyCODONE-acetaminophen (PERCOCET) 5-325 MG tablet Take 1 tablet by mouth every 4 (four) hours as needed for severe pain (pain score 7-10).   pantoprazole (PROTONIX) 40 MG tablet Take 1 tablet (40 mg total) by mouth daily.   potassium chloride (KLOR-CON M) 10 MEQ tablet Take 2 tablets (20 mEq total) by mouth daily. (Patient taking differently: Take 10 mEq by mouth daily.)   pravastatin (PRAVACHOL) 40 MG tablet TAKE 1 TABLET DAILY (Patient taking differently: Take 40 mg by mouth every evening.)   silver sulfADIAZINE (SILVADENE) 1 % cream Apply 1 Application topically daily.   umeclidinium bromide (INCRUSE ELLIPTA) 62.5 MCG/ACT AEPB Inhale 1 puff into the lungs daily.   cephALEXin (KEFLEX) 500 MG capsule  (Patient not taking: Reported on 05/08/2023)   linezolid (ZYVOX) 600 MG tablet Take 1 tablet (600 mg total) by mouth 2 (two) times daily. (Patient not taking: Reported on 05/08/2023)   SANTYL  250 UNIT/GM ointment APPLY TO AFFECTED AREAS ONCE DAILY. (Patient not taking: Reported on 05/08/2023)   No facility-administered encounter medications on file as of 05/08/2023.     Patient Active Problem List   Diagnosis Date Noted   Wound infection 03/18/2023   Cellulitis of right lower extremity 03/14/2023   Skin ulcer of right ankle with fat layer exposed (HCC) 03/14/2023   Sepsis (HCC) 03/13/2023   CHF, acute on chronic (HCC) 11/17/2022   CKD stage 3b, GFR 30-44 ml/min (HCC) 11/17/2022   D-dimer, elevated 11/17/2022   Acute respiratory  failure with hypoxia (HCC) 06/18/2022   Aspiration pneumonitis (HCC) 06/18/2022   Acute on chronic HFrEF (heart failure with reduced ejection fraction) (HCC) 06/18/2022   Hypercoagulable state due to persistent atrial fibrillation (HCC) 06/11/2022   Acute heart failure with preserved ejection fraction (HFpEF) (HCC) 05/24/2022   Cellulitis and abscess of toe of left foot 03/07/2022   GERD (gastroesophageal reflux disease) 03/06/2022   Atypical atrial flutter (HCC)    Sepsis due to undetermined organism (HCC) 12/25/2021   Diarrhea 12/25/2021   Hyperkalemia 12/25/2021   Osteomyelitis of great toe of left foot (HCC) 12/25/2021   PAD (peripheral artery disease) (HCC) 12/19/2021   Osteoporosis    Tibial plateau fracture, left approx. 05/10/17 06/27/2017   COPD, moderate (HCC) 07/12/2015   Multiple gastric ulcers    Coagulopathy (HCC)    Bleeding gastrointestinal    Acute GI bleeding    CAD in native artery    Supratherapeutic INR    Right lower lobe lung mass    Cavitary lesion of lung    AKI (acute kidney injury) (HCC)    Chronic back pain    GI bleed 02/14/2015   Carotid artery disease (HCC) 02/14/2015   Leukocytosis 02/14/2015   Lung nodules 09/29/2014   Smoking history 09/29/2014   Pneumonia with cavity of lung 07/09/2014   Lung mass 07/09/2014   Thrombocytosis (HCC) 05/21/2014   Protein-calorie malnutrition, moderate (HCC) 05/21/2014   Anemia    Generalized weakness    Necrotic pneumonia (HCC) 05/20/2014   Pneumonia 05/20/2014   Elevated liver enzymes 12/16/2013   Tremor 06/24/2013   Encounter for therapeutic drug monitoring 05/18/2013   Dizziness 01/12/2013   Wound of ankle 10/23/2010   Long term current use of anticoagulant 07/05/2010   Carotid bruit 11/01/2009   Hyperlipidemia 10/26/2009   Essential hypertension 10/26/2009   Persistent atrial fibrillation (HCC) 10/26/2009   ACUTE ON CHRONIC DIASTOLIC HEART FAILURE 10/26/2009   SYNCOPE AND COLLAPSE 10/26/2009      Health Maintenance Due  Topic Date Due   Medicare Annual Wellness (AWV)  Never done   Zoster Vaccines- Shingrix (2 of 2) 03/07/2017   DTaP/Tdap/Td (2 - Td or Tdap) 07/31/2021   COVID-19 Vaccine (4 - 2024-25 season) 03/19/2023      Physical Exam   Gen= a xo by 3 in nad Skin = shallow wound to anterior tibia see photo, no drainage or surrounding erythema CBC Lab Results  Component Value Date   WBC 7.0 04/17/2023   RBC 3.17 (L) 04/17/2023   HGB 10.2 (L) 04/17/2023   HCT 31.1 (L) 04/17/2023   PLT 149 04/17/2023   MCV 98.1 04/17/2023   MCH 32.2 04/17/2023   MCHC 32.8 04/17/2023   RDW 14.5 04/17/2023   LYMPHSABS 1.0 03/13/2023   MONOABS 0.8 03/13/2023   EOSABS 91 04/17/2023    BMET Lab Results  Component Value Date   NA 137 03/20/2023   K 3.9 03/20/2023  CL 105 03/20/2023   CO2 20 (L) 03/20/2023   GLUCOSE 118 (H) 03/20/2023   BUN 27 (H) 03/20/2023   CREATININE 1.43 (H) 03/20/2023   CALCIUM 8.7 (L) 03/20/2023   GFRNONAA 36 (L) 03/20/2023   GFRAA >60 02/20/2015      Assessment and Plan  Continue with wound care. Continue to follow [place].   Hypertension = stable  Chronic osteo/pressure non healing = continue with wound care. Has completed 6 wk of treatment. Now unclear if she is having intolerance to linezolid vs. Fluconazole ( for thrush) both stopped. If need to retreat, may need to do dalbavancin.  Rtc as needed

## 2023-05-18 LAB — LAB REPORT - SCANNED: EGFR: 32

## 2023-05-27 ENCOUNTER — Other Ambulatory Visit: Payer: Self-pay

## 2023-05-28 ENCOUNTER — Ambulatory Visit (INDEPENDENT_AMBULATORY_CARE_PROVIDER_SITE_OTHER): Payer: Medicare HMO | Admitting: Podiatry

## 2023-05-28 ENCOUNTER — Encounter: Payer: Self-pay | Admitting: Podiatry

## 2023-05-28 ENCOUNTER — Other Ambulatory Visit: Payer: Self-pay | Admitting: Internal Medicine

## 2023-05-28 DIAGNOSIS — Z9889 Other specified postprocedural states: Secondary | ICD-10-CM | POA: Diagnosis not present

## 2023-05-28 DIAGNOSIS — L97312 Non-pressure chronic ulcer of right ankle with fat layer exposed: Secondary | ICD-10-CM | POA: Diagnosis not present

## 2023-05-28 NOTE — Progress Notes (Signed)
  Subjective:  Patient ID: [name], female    DOB: [birth year],  MRN: [id number]  Chief Complaint  Patient presents with   Wound Check    Right ankle ulcer, healing well starting to close up    DOS: 03/15/2023 Procedure: Right ankle ulcer debridement with prep for graft, bone biopsies distal fibula, application of arthroflex dermal allograft  83 y.o. female returns for post-op check.  Patient is approximately 2.5 month status post above procedures.  She reports she is doing well wound seems to be healing and have been doing mupirocin and nonadherent gauze dressing changes daily since last appointment.  Review of Systems: Negative except as noted in the HPI. Denies N/V/F/Ch.   Objective:  There were no vitals filed for this visit. There is no height or weight on file to calculate BMI. Constitutional Well developed. Well nourished.  Vascular Foot warm and well perfused. Capillary refill normal to all digits.  Calf is soft and supple, no posterior calf or knee pain, negative Homans' sign  Neurologic Normal speech. Oriented to person, place, and time. Epicritic sensation to light touch grossly present bilaterally.  Dermatologic Ulceration the right lateral ankle is shrinking with decreased depth as well as healing and side-to-side.  Much improved from prior.  No erythema or drainage noted.  There was noted to be some hyperkeratotic tissue/eschar surrounding the wound.    Orthopedic: Tenderness to palpation noted about the surgical site.   Multiple view plain film radiographs: Deferred   Path: Ulceration sent for pathology showed ulceration with acute inflammation and granulation tissue.  No specific diagnosis given as to any type of abnormal ulceration.  Bone biopsy right ankle distal fibula resulted with benign bone and connective tissue no inflammatory changes were identified.  Culture: Corynebacterium stratum, group A strep Assessment:   1. Ankle ulcer, right, with fat layer  exposed (HCC)   2. Post-operative state      Plan:  Patient was evaluated and treated and all questions answered.  S/p foot surgery right ankle ulcer debridement with prep for graft fibular bone biopsy and dermal allograft application -Progressing as expected post-operatively wound is healing at this time,  -Continue local wound care-will switch to Santyl ointment which the patient already has from prior treatment at this time believe it would help to break down some of the fibrotic tissues present in the wound bed. -Apply every second or third day and cover with nonhearing gauze gauze wrap Ace wrap -XR: Deferred -WB Status: Weightbearing as tolerated in regular shoe -Sutures: None present -Medications: No indication for antibiotics at this time -Foot redressed with antibiotic med dressing.  Return in about 3 weeks (around 06/18/2023) for Wound check right ankle.         Corinna Gab, DPM [facility] / [facility]

## 2023-06-11 ENCOUNTER — Encounter: Payer: Medicare HMO | Attending: Internal Medicine | Admitting: Emergency Medicine

## 2023-06-11 VITALS — BP 161/61 | HR 57 | Resp 16

## 2023-06-11 DIAGNOSIS — M810 Age-related osteoporosis without current pathological fracture: Secondary | ICD-10-CM

## 2023-06-11 MED ORDER — DENOSUMAB 60 MG/ML ~~LOC~~ SOSY
60.0000 mg | PREFILLED_SYRINGE | Freq: Once | SUBCUTANEOUS | Status: AC
  Administered 2023-06-11: 60 mg via SUBCUTANEOUS

## 2023-06-11 NOTE — Progress Notes (Signed)
 Diagnosis: Osteoporosis  Provider:   Fidel Levy  Procedure: Injection  Prolia (Denosumab), Dose: 60 mg, Site: subcutaneous, Number of injections: 1  Injection Site(s): Left arm  Post Care: Patient declined observation  Discharge: Condition: Good, Destination: Home . AVS Provided  Performed by:  Arrie Senate, RN

## 2023-06-14 ENCOUNTER — Telehealth: Payer: Self-pay | Admitting: Cardiovascular Disease

## 2023-06-14 LAB — LAB REPORT - SCANNED: EGFR: 32

## 2023-06-14 NOTE — Telephone Encounter (Signed)
 Spouse calling bc pcp office wanting to start pt on Levothyroxine and pt is wanting to know if ok to take. Requesting cb to discuss due to lab results.

## 2023-06-14 NOTE — Telephone Encounter (Signed)
 Called patient back. Patient's husband wanted to let Dr. Eden Emms know that patient has been started on levothyroxine by her PCP, due to her recent lab work. Informed patient that this should be fine, but will let Dr. Eden Emms know.

## 2023-06-18 ENCOUNTER — Ambulatory Visit (INDEPENDENT_AMBULATORY_CARE_PROVIDER_SITE_OTHER): Payer: Medicare HMO | Admitting: Podiatry

## 2023-06-18 ENCOUNTER — Encounter: Payer: Self-pay | Admitting: Podiatry

## 2023-06-18 VITALS — Ht 63.0 in | Wt 90.0 lb

## 2023-06-18 DIAGNOSIS — Z9889 Other specified postprocedural states: Secondary | ICD-10-CM

## 2023-06-18 DIAGNOSIS — L97312 Non-pressure chronic ulcer of right ankle with fat layer exposed: Secondary | ICD-10-CM

## 2023-06-18 NOTE — Progress Notes (Signed)
  Subjective:  Patient ID: [name], female    DOB: February 28, 1941,  MRN: [id number]  Chief Complaint  Patient presents with   Wound Check    Pt is here to f/u on right ankle wound states she thinks its getting a lot better.    DOS: 03/15/2023 Procedure: Right ankle ulcer debridement with prep for graft, bone biopsies distal fibula, application of arthroflex dermal allograft  83 y.o. female returns for post-op check.  Patient is approximately 3 mo status post above procedures.  She reports she is doing well wound seems to be healing and have been doing mupirocin and nonadherent gauze dressing changes daily since last appointment.  Review of Systems: Negative except as noted in the HPI. Denies N/V/F/Ch.   Objective:  There were no vitals filed for this visit. Body mass index is 15.94 kg/m. Constitutional Well developed. Well nourished.  Vascular Foot warm and well perfused. Capillary refill normal to all digits.  Calf is soft and supple, no posterior calf or knee pain, negative Homans' sign  Neurologic Normal speech. Oriented to person, place, and time. Epicritic sensation to light touch grossly present bilaterally.  Dermatologic Ulceration the right lateral ankle is shrinking with decreased depth as well as healing and side-to-side.  Much improved from prior.  No erythema or drainage noted.  There was noted to be some hyperkeratotic tissue/eschar surrounding the wound.       Orthopedic: Tenderness to palpation noted about the surgical site.   Multiple view plain film radiographs: Deferred   Path: Ulceration sent for pathology showed ulceration with acute inflammation and granulation tissue.  No specific diagnosis given as to any type of abnormal ulceration.  Bone biopsy right ankle distal fibula resulted with benign bone and connective tissue no inflammatory changes were identified.  Culture: Corynebacterium stratum, group A strep Assessment:   1. Ankle ulcer, right, with  fat layer exposed (HCC)   2. Post-operative state       Plan:  Patient was evaluated and treated and all questions answered.  S/p foot surgery right ankle ulcer debridement with prep for graft fibular bone biopsy and dermal allograft application -Progressing as expected post-operatively wound is healing at this time,  -Continue local wound care-we will continue with piercing ointment to the wound bed and then cover with adhesive dressing -Apply every second or third day and cover with nonhearing gauze gauze wrap Ace wrap -XR: Deferred -WB Status: Weightbearing as tolerated in regular shoe -Sutures: None present -Medications: No indication for antibiotics at this time -Foot redressed with antibiotic ointment dressing.         Corinna Gab, DPM [facility] / [facility]

## 2023-07-01 ENCOUNTER — Ambulatory Visit (HOSPITAL_COMMUNITY)
Admission: RE | Admit: 2023-07-01 | Discharge: 2023-07-01 | Disposition: A | Discharge status: Home / Self Care | Payer: Medicare HMO | Source: Ambulatory Visit | Attending: Surgery | Admitting: Surgery

## 2023-07-01 ENCOUNTER — Ambulatory Visit (INDEPENDENT_AMBULATORY_CARE_PROVIDER_SITE_OTHER)
Admission: RE | Admit: 2023-07-01 | Discharge: 2023-07-01 | Disposition: A | Discharge status: Home / Self Care | Payer: Medicare HMO | Source: Ambulatory Visit | Attending: Surgery | Admitting: Surgery

## 2023-07-01 ENCOUNTER — Ambulatory Visit (INDEPENDENT_AMBULATORY_CARE_PROVIDER_SITE_OTHER): Payer: Medicare HMO | Admitting: Surgery

## 2023-07-01 ENCOUNTER — Encounter: Payer: Self-pay | Admitting: Surgery

## 2023-07-01 VITALS — BP 177/71 | HR 67 | Temp 97.9°F | Wt 89.0 lb

## 2023-07-01 DIAGNOSIS — I739 Peripheral vascular disease, unspecified: Secondary | ICD-10-CM

## 2023-07-01 DIAGNOSIS — I6523 Occlusion and stenosis of bilateral carotid arteries: Secondary | ICD-10-CM | POA: Diagnosis present

## 2023-07-01 DIAGNOSIS — I7025 Atherosclerosis of native arteries of other extremities with ulceration: Secondary | ICD-10-CM

## 2023-07-01 LAB — VAS US ABI WITH/WO TBI
Left ABI: 0.77
Right ABI: 0.86

## 2023-07-01 NOTE — H&P (View-Only) (Signed)
 [facility] [place]  Patient name: [name] MRN: [id number] DOB: Jun 19, 1940 Sex: female   REASON FOR VISIT:    Follow up  HISOTRY OF PRESENT ILLNESS:    [name] is a 83 y.o. female who has undergone the following procedures:   12/19/2021: Left superficial femoral artery stent, DCB, left popliteal artery, angioplasty, left peroneal artery (critical limb ischemia) 12/29/2021: Left great toe amputation, Dr. Allena Katz 08/28/2022: Laser atherectomy with angioplasty, left popliteal and left anterior tibial artery (left leg ulcer) 10/16/2022: Laser atherectomy with angioplasty, right anterior tibial artery (ulcer) 02/26/2023: Laser atherectomy and DCB to the right SFA.  Angioplasty, right anterior tibial and right peroneal artery 03/15/2023: I&D right ankle ulcer with allograft (Dr. Annamary Rummage)   She continues to get wound care for her right ankle ulcer.  She does not have any pain or new symptoms on either leg.   She has hx of breast cancer, status post left mastectomy.  She is on Eliquis for atrial fibrillation.   She has carotid artery stenosis that is followed by Dr. Eden Emms.  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  She takes a statin for hypercholesterolemia.  She is on Eliquis.  She is a former smoker  PAST MEDICAL HISTORY:   Past Medical History:  Diagnosis Date   Anemia 05/2014   Atrial fibrillation (HCC)    x 3 yrs   Breast cancer (HCC)    S/P left mastectomy and chemotherapy 1989 remained in remission   Carotid artery occlusion    Carotid bruit    LICA 60-79% (duplex 9/11)   Degenerative arthritis of spine 2015   Chronic back pain   Hyperlipidemia    Hypertension    Meningioma (HCC)    Osteoporosis    Peripheral arterial disease (HCC)    Pneumonia 05/19/2014   Stroke [facility])    CVA 2008   Subclavian steal syndrome    Ulcers of both lower extremities (HCC) 2015      FAMILY HISTORY:   Family History  Problem Relation Age of Onset   Alzheimer's disease Mother    Dementia Mother    Hypertension Mother    Hyperlipidemia Mother    Parkinsonism Father     SOCIAL HISTORY:   Social History   Tobacco Use   Smoking status: Former    Current packs/day: 0.00    Average packs/day: 1 pack/day for 56.0 years (56.0 ttl pk-yrs)    Types: Cigarettes    Start date: 08/14/1950    Quit date: 08/14/2006    Years since quitting: 16.8    Passive exposure: Never   Smokeless tobacco: Never   Tobacco comments:    quit in 2008  Substance Use Topics   Alcohol use: No    Alcohol/week: 0.0 standard drinks of alcohol     ALLERGIES:   Allergies  Allergen Reactions   Iodine Rash and Other (See Comments)    BETADINE, blistering rash/burns   Iohexol Rash and Other (See Comments)    Blisters; PT NEEDS 13-HOUR PREP    Petroleum Gauze Non-Woven 3x9" [Wound Dressings] Other (See Comments)    BURNING SENSATION, RED SKIN   Sulfa Antibiotics Nausea And Vomiting   Wound Dressing Adhesive Other (See Comments)    Burns skin   Tape Itching, Rash and Other (See Comments)    "DO NOT USE ADHESIVE TAPE" Not even a Band Aid" NO PAPER TAPE Burns skin Skin is very very thin  CURRENT MEDICATIONS:   Current Outpatient Medications  Medication Sig Dispense Refill   acetaminophen (TYLENOL) 325 MG tablet Take 2 tablets (650 mg total) by mouth every 6 (six) hours as needed for mild pain (pain score 1-3) (or Fever >/= 101). 30 tablet 0   albuterol (VENTOLIN HFA) 108 (90 Base) MCG/ACT inhaler Inhale 2 puffs into the lungs every 6 (six) hours as needed for wheezing or shortness of breath. 18 g 1   AMBULATORY NON FORMULARY MEDICATION Dispense one Readi Steadi glove 1 Units 0   amiodarone (PACERONE) 200 MG tablet Take 0.5 tablets (100 mg total) by mouth every other day. (Patient taking differently: Take 200 mg by mouth 2 (two) times daily.)     apixaban (ELIQUIS) 2.5 MG  TABS tablet Take 1 tablet (2.5 mg total) by mouth 2 (two) times daily. 180 tablet 1   ascorbic acid (VITAMIN C) 500 MG tablet Take 500 mg by mouth in the morning.     CALCIUM PO Take 1,200 mg by mouth 2 (two) times daily.     Camphor-Menthol-Methyl Sal (SALONPAS) 3.04-14-08 % PTCH Place 1 patch onto the skin daily as needed (pain.).     cephALEXin (KEFLEX) 500 MG capsule      Cholecalciferol (VITAMIN D-3) 25 MCG (1000 UT) CAPS Take 1,000 Units by mouth 2 (two) times daily.     clopidogrel (PLAVIX) 75 MG tablet TAKE 1 TABLET EVERY MORNING (Patient taking differently: Take 75 mg by mouth once.) 90 tablet 2   denosumab (PROLIA) 60 MG/ML SOSY injection Inject 60 mg into the skin every 6 (six) months.     doxycycline (VIBRA-TABS) 100 MG tablet Take 1 tablet by mouth 2 (two) times daily.     Ferrous Sulfate (IRON PO) Take 65 mg by mouth at bedtime.     furosemide (LASIX) 20 MG tablet Take 2 tablets (40 mg total) by mouth daily. 120 tablet 3   levothyroxine (SYNTHROID) 25 MCG tablet Take 25 mcg by mouth daily before breakfast.     linezolid (ZYVOX) 600 MG tablet Take 1 tablet (600 mg total) by mouth 2 (two) times daily. 60 tablet 0   MAGNESIUM OXIDE PO Take 500 mg by mouth every morning.     metoprolol tartrate (LOPRESSOR) 25 MG tablet Take 3 tablets (75 mg total) by mouth 2 (two) times daily. 540 tablet 2   Multiple Vitamin (MULTIVITAMIN WITH MINERALS) TABS tablet Take 1 tablet by mouth every morning.     mupirocin ointment (BACTROBAN) 2 % Apply 1 Application topically daily. 22 g 0   nystatin (MYCOSTATIN) 100000 UNIT/ML suspension Take 5 mLs (500,000 Units total) by mouth 4 (four) times daily. 60 mL 0   oxyCODONE-acetaminophen (PERCOCET) 5-325 MG tablet Take 1 tablet by mouth every 4 (four) hours as needed for severe pain (pain score 7-10). 20 tablet 0   pantoprazole (PROTONIX) 40 MG tablet Take 1 tablet (40 mg total) by mouth daily. 90 tablet 2   potassium chloride (KLOR-CON M) 10 MEQ tablet Take 2  tablets (20 mEq total) by mouth daily. (Patient taking differently: Take 10 mEq by mouth daily.) 120 tablet 3   pravastatin (PRAVACHOL) 40 MG tablet TAKE 1 TABLET DAILY (Patient taking differently: Take 40 mg by mouth every evening.) 90 tablet 3   SANTYL 250 UNIT/GM ointment APPLY TO AFFECTED AREAS ONCE DAILY. 30 g 0   silver sulfADIAZINE (SILVADENE) 1 % cream Apply 1 Application topically daily. 50 g 0   umeclidinium bromide (INCRUSE ELLIPTA) 62.5 MCG/ACT AEPB  Inhale 1 puff into the lungs daily. 90 each 3   No current facility-administered medications for this visit.    REVIEW OF SYSTEMS:   [X]  denotes positive finding, [ ]  denotes negative finding Cardiac  Comments:  Chest pain or chest pressure:    Shortness of breath upon exertion:    Short of breath when lying flat:    Irregular heart rhythm:        Vascular    Pain in calf, thigh, or hip brought on by ambulation:    Pain in feet at night that wakes you up from your sleep:     Blood clot in your veins:    Leg swelling:         Pulmonary    Oxygen at home:    Productive cough:     Wheezing:         Neurologic    Sudden weakness in arms or legs:     Sudden numbness in arms or legs:     Sudden onset of difficulty speaking or slurred speech:    Temporary loss of vision in one eye:     Problems with dizziness:         Gastrointestinal    Blood in stool:     Vomited blood:         Genitourinary    Burning when urinating:     Blood in urine:        Psychiatric    Major depression:         Hematologic    Bleeding problems:    Problems with blood clotting too easily:        Skin    Rashes or ulcers:        Constitutional    Fever or chills:      PHYSICAL EXAM:   Vitals:   07/01/23 1142  BP: (!) 177/71  Pulse: 67  Temp: 97.9 F (36.6 C)  SpO2: 92%  Weight: 89 lb (40.4 kg)    GENERAL: The patient is a well-nourished female, in no acute distress. The vital signs are documented above. CARDIAC: There is a  regular rate and rhythm.  PULMONARY: Non-labored respirations MUSCULOSKELETAL: There are no major deformities or cyanosis. NEUROLOGIC: No focal weakness or paresthesias are detected. SKIN: There are no ulcers or rashes noted. PSYCHIATRIC: The patient has a normal affect.  STUDIES:   I have reviewed the following: Carotid: Right Carotid: Velocities in the right ICA are consistent with a 1-39%  stenosis.   Left Carotid: Velocities in the left ICA are consistent with a 1-39%  stenosis               (high end of range).   Vertebrals:  Bilateral vertebral arteries demonstrate antegrade flow.  Subclavians: Right subclavian artery flow was disturbed. Normal flow               hemodynamics were seen in the left subclavian artery.    ABI/TBIToday's ABIToday's TBIPrevious ABIPrevious TBI  +-------+-----------+-----------+------------+------------+  Right 0.86       0.78       1.19        1.10          +-------+-----------+-----------+------------+------------+  Left  0.77       amp        0.65        0.51          +-------+-----------+-----------+------------+------------+   Lower extremity: Left Stent(s):  +---------------+--------+---------------+----------+--------+  SFA  PSV cm/sStenosis       Waveform  Comments  +---------------+--------+---------------+----------+--------+  Prox to Stent  63                     biphasic            +---------------+--------+---------------+----------+--------+  Proximal Stent 50                     biphasic            +---------------+--------+---------------+----------+--------+  Mid Stent      483     50-99% stenosisbiphasic            +---------------+--------+---------------+----------+--------+  Distal Stent   32                     monophasic          +---------------+--------+---------------+----------+--------+  Distal to Stent40                     biphasic             +---------------+--------+---------------+----------+--------+    Summary:  Right: 30-49% stenosis noted in the proximal superficial femoral artery.   Left: Stenosis is noted within the mid SFA stent. Velocities suggest  50-99%.  Left ABIs appear mildly improved from the last exam on 04/01/2023;  however, those ABIs reflect a significant drop from the exam prior to that  on 02/12/2023 from 1.03 to 0.65.   MEDICAL ISSUES:   Carotid: No significant carotid stenosis on today's ultrasound.  She will get surveillance imaging in 6 months  PAD: Left leg ultrasound shows markedly elevated velocities in the mid left SFA stent at 483 cm/s.  She has also had a drop in her ABIs from 1 down to 0.65.  I think this represents a threatened stent and needs to be further evaluated with angiography.  To be scheduled for Tuesday, April 8 via right femoral approach.  I will evaluate both legs since she still has an active ulcer on the right and plan on intervening on the left likely with laser atherectomy and DCB.  She will need to be off of her Eliquis prior to her procedure  She does live in [place] and has asked about follow-up in [place] which I will try to arrange.    Charlena Cross, MD, FACS [facility] [phone] Pager [phone]

## 2023-07-01 NOTE — Progress Notes (Signed)
 [facility] [place]  Patient name: [name] MRN: [id number] DOB: Jun 19, 1940 Sex: female   REASON FOR VISIT:    Follow up  HISOTRY OF PRESENT ILLNESS:    [name] is a 83 y.o. female who has undergone the following procedures:   12/19/2021: Left superficial femoral artery stent, DCB, left popliteal artery, angioplasty, left peroneal artery (critical limb ischemia) 12/29/2021: Left great toe amputation, Dr. Allena Katz 08/28/2022: Laser atherectomy with angioplasty, left popliteal and left anterior tibial artery (left leg ulcer) 10/16/2022: Laser atherectomy with angioplasty, right anterior tibial artery (ulcer) 02/26/2023: Laser atherectomy and DCB to the right SFA.  Angioplasty, right anterior tibial and right peroneal artery 03/15/2023: I&D right ankle ulcer with allograft (Dr. Annamary Rummage)   She continues to get wound care for her right ankle ulcer.  She does not have any pain or new symptoms on either leg.   She has hx of breast cancer, status post left mastectomy.  She is on Eliquis for atrial fibrillation.   She has carotid artery stenosis that is followed by Dr. Eden Emms.  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  She takes a statin for hypercholesterolemia.  She is on Eliquis.  She is a former smoker  PAST MEDICAL HISTORY:   Past Medical History:  Diagnosis Date   Anemia 05/2014   Atrial fibrillation (HCC)    x 3 yrs   Breast cancer (HCC)    S/P left mastectomy and chemotherapy 1989 remained in remission   Carotid artery occlusion    Carotid bruit    LICA 60-79% (duplex 9/11)   Degenerative arthritis of spine 2015   Chronic back pain   Hyperlipidemia    Hypertension    Meningioma (HCC)    Osteoporosis    Peripheral arterial disease (HCC)    Pneumonia 05/19/2014   Stroke [facility])    CVA 2008   Subclavian steal syndrome    Ulcers of both lower extremities (HCC) 2015      FAMILY HISTORY:   Family History  Problem Relation Age of Onset   Alzheimer's disease Mother    Dementia Mother    Hypertension Mother    Hyperlipidemia Mother    Parkinsonism Father     SOCIAL HISTORY:   Social History   Tobacco Use   Smoking status: Former    Current packs/day: 0.00    Average packs/day: 1 pack/day for 56.0 years (56.0 ttl pk-yrs)    Types: Cigarettes    Start date: 08/14/1950    Quit date: 08/14/2006    Years since quitting: 16.8    Passive exposure: Never   Smokeless tobacco: Never   Tobacco comments:    quit in 2008  Substance Use Topics   Alcohol use: No    Alcohol/week: 0.0 standard drinks of alcohol     ALLERGIES:   Allergies  Allergen Reactions   Iodine Rash and Other (See Comments)    BETADINE, blistering rash/burns   Iohexol Rash and Other (See Comments)    Blisters; PT NEEDS 13-HOUR PREP    Petroleum Gauze Non-Woven 3x9" [Wound Dressings] Other (See Comments)    BURNING SENSATION, RED SKIN   Sulfa Antibiotics Nausea And Vomiting   Wound Dressing Adhesive Other (See Comments)    Burns skin   Tape Itching, Rash and Other (See Comments)    "DO NOT USE ADHESIVE TAPE" Not even a Band Aid" NO PAPER TAPE Burns skin Skin is very very thin  CURRENT MEDICATIONS:   Current Outpatient Medications  Medication Sig Dispense Refill   acetaminophen (TYLENOL) 325 MG tablet Take 2 tablets (650 mg total) by mouth every 6 (six) hours as needed for mild pain (pain score 1-3) (or Fever >/= 101). 30 tablet 0   albuterol (VENTOLIN HFA) 108 (90 Base) MCG/ACT inhaler Inhale 2 puffs into the lungs every 6 (six) hours as needed for wheezing or shortness of breath. 18 g 1   AMBULATORY NON FORMULARY MEDICATION Dispense one Readi Steadi glove 1 Units 0   amiodarone (PACERONE) 200 MG tablet Take 0.5 tablets (100 mg total) by mouth every other day. (Patient taking differently: Take 200 mg by mouth 2 (two) times daily.)     apixaban (ELIQUIS) 2.5 MG  TABS tablet Take 1 tablet (2.5 mg total) by mouth 2 (two) times daily. 180 tablet 1   ascorbic acid (VITAMIN C) 500 MG tablet Take 500 mg by mouth in the morning.     CALCIUM PO Take 1,200 mg by mouth 2 (two) times daily.     Camphor-Menthol-Methyl Sal (SALONPAS) 3.04-14-08 % PTCH Place 1 patch onto the skin daily as needed (pain.).     cephALEXin (KEFLEX) 500 MG capsule      Cholecalciferol (VITAMIN D-3) 25 MCG (1000 UT) CAPS Take 1,000 Units by mouth 2 (two) times daily.     clopidogrel (PLAVIX) 75 MG tablet TAKE 1 TABLET EVERY MORNING (Patient taking differently: Take 75 mg by mouth once.) 90 tablet 2   denosumab (PROLIA) 60 MG/ML SOSY injection Inject 60 mg into the skin every 6 (six) months.     doxycycline (VIBRA-TABS) 100 MG tablet Take 1 tablet by mouth 2 (two) times daily.     Ferrous Sulfate (IRON PO) Take 65 mg by mouth at bedtime.     furosemide (LASIX) 20 MG tablet Take 2 tablets (40 mg total) by mouth daily. 120 tablet 3   levothyroxine (SYNTHROID) 25 MCG tablet Take 25 mcg by mouth daily before breakfast.     linezolid (ZYVOX) 600 MG tablet Take 1 tablet (600 mg total) by mouth 2 (two) times daily. 60 tablet 0   MAGNESIUM OXIDE PO Take 500 mg by mouth every morning.     metoprolol tartrate (LOPRESSOR) 25 MG tablet Take 3 tablets (75 mg total) by mouth 2 (two) times daily. 540 tablet 2   Multiple Vitamin (MULTIVITAMIN WITH MINERALS) TABS tablet Take 1 tablet by mouth every morning.     mupirocin ointment (BACTROBAN) 2 % Apply 1 Application topically daily. 22 g 0   nystatin (MYCOSTATIN) 100000 UNIT/ML suspension Take 5 mLs (500,000 Units total) by mouth 4 (four) times daily. 60 mL 0   oxyCODONE-acetaminophen (PERCOCET) 5-325 MG tablet Take 1 tablet by mouth every 4 (four) hours as needed for severe pain (pain score 7-10). 20 tablet 0   pantoprazole (PROTONIX) 40 MG tablet Take 1 tablet (40 mg total) by mouth daily. 90 tablet 2   potassium chloride (KLOR-CON M) 10 MEQ tablet Take 2  tablets (20 mEq total) by mouth daily. (Patient taking differently: Take 10 mEq by mouth daily.) 120 tablet 3   pravastatin (PRAVACHOL) 40 MG tablet TAKE 1 TABLET DAILY (Patient taking differently: Take 40 mg by mouth every evening.) 90 tablet 3   SANTYL 250 UNIT/GM ointment APPLY TO AFFECTED AREAS ONCE DAILY. 30 g 0   silver sulfADIAZINE (SILVADENE) 1 % cream Apply 1 Application topically daily. 50 g 0   umeclidinium bromide (INCRUSE ELLIPTA) 62.5 MCG/ACT AEPB  Inhale 1 puff into the lungs daily. 90 each 3   No current facility-administered medications for this visit.    REVIEW OF SYSTEMS:   [X]  denotes positive finding, [ ]  denotes negative finding Cardiac  Comments:  Chest pain or chest pressure:    Shortness of breath upon exertion:    Short of breath when lying flat:    Irregular heart rhythm:        Vascular    Pain in calf, thigh, or hip brought on by ambulation:    Pain in feet at night that wakes you up from your sleep:     Blood clot in your veins:    Leg swelling:         Pulmonary    Oxygen at home:    Productive cough:     Wheezing:         Neurologic    Sudden weakness in arms or legs:     Sudden numbness in arms or legs:     Sudden onset of difficulty speaking or slurred speech:    Temporary loss of vision in one eye:     Problems with dizziness:         Gastrointestinal    Blood in stool:     Vomited blood:         Genitourinary    Burning when urinating:     Blood in urine:        Psychiatric    Major depression:         Hematologic    Bleeding problems:    Problems with blood clotting too easily:        Skin    Rashes or ulcers:        Constitutional    Fever or chills:      PHYSICAL EXAM:   Vitals:   07/01/23 1142  BP: (!) 177/71  Pulse: 67  Temp: 97.9 F (36.6 C)  SpO2: 92%  Weight: 89 lb (40.4 kg)    GENERAL: The patient is a well-nourished female, in no acute distress. The vital signs are documented above. CARDIAC: There is a  regular rate and rhythm.  PULMONARY: Non-labored respirations MUSCULOSKELETAL: There are no major deformities or cyanosis. NEUROLOGIC: No focal weakness or paresthesias are detected. SKIN: There are no ulcers or rashes noted. PSYCHIATRIC: The patient has a normal affect.  STUDIES:   I have reviewed the following: Carotid: Right Carotid: Velocities in the right ICA are consistent with a 1-39%  stenosis.   Left Carotid: Velocities in the left ICA are consistent with a 1-39%  stenosis               (high end of range).   Vertebrals:  Bilateral vertebral arteries demonstrate antegrade flow.  Subclavians: Right subclavian artery flow was disturbed. Normal flow               hemodynamics were seen in the left subclavian artery.    ABI/TBIToday's ABIToday's TBIPrevious ABIPrevious TBI  +-------+-----------+-----------+------------+------------+  Right 0.86       0.78       1.19        1.10          +-------+-----------+-----------+------------+------------+  Left  0.77       amp        0.65        0.51          +-------+-----------+-----------+------------+------------+   Lower extremity: Left Stent(s):  +---------------+--------+---------------+----------+--------+  SFA  PSV cm/sStenosis       Waveform  Comments  +---------------+--------+---------------+----------+--------+  Prox to Stent  63                     biphasic            +---------------+--------+---------------+----------+--------+  Proximal Stent 50                     biphasic            +---------------+--------+---------------+----------+--------+  Mid Stent      483     50-99% stenosisbiphasic            +---------------+--------+---------------+----------+--------+  Distal Stent   32                     monophasic          +---------------+--------+---------------+----------+--------+  Distal to Stent40                     biphasic             +---------------+--------+---------------+----------+--------+    Summary:  Right: 30-49% stenosis noted in the proximal superficial femoral artery.   Left: Stenosis is noted within the mid SFA stent. Velocities suggest  50-99%.  Left ABIs appear mildly improved from the last exam on 04/01/2023;  however, those ABIs reflect a significant drop from the exam prior to that  on 02/12/2023 from 1.03 to 0.65.   MEDICAL ISSUES:   Carotid: No significant carotid stenosis on today's ultrasound.  She will get surveillance imaging in 6 months  PAD: Left leg ultrasound shows markedly elevated velocities in the mid left SFA stent at 483 cm/s.  She has also had a drop in her ABIs from 1 down to 0.65.  I think this represents a threatened stent and needs to be further evaluated with angiography.  To be scheduled for Tuesday, April 8 via right femoral approach.  I will evaluate both legs since she still has an active ulcer on the right and plan on intervening on the left likely with laser atherectomy and DCB.  She will need to be off of her Eliquis prior to her procedure  She does live in [place] and has asked about follow-up in [place] which I will try to arrange.    Charlena Cross, MD, FACS [facility] [phone] Pager [phone]

## 2023-07-02 ENCOUNTER — Other Ambulatory Visit: Payer: Self-pay

## 2023-07-02 DIAGNOSIS — I7025 Atherosclerosis of native arteries of other extremities with ulceration: Secondary | ICD-10-CM

## 2023-07-09 ENCOUNTER — Encounter: Payer: Self-pay | Admitting: Podiatry

## 2023-07-09 ENCOUNTER — Ambulatory Visit (INDEPENDENT_AMBULATORY_CARE_PROVIDER_SITE_OTHER): Admitting: Podiatry

## 2023-07-09 DIAGNOSIS — L97312 Non-pressure chronic ulcer of right ankle with fat layer exposed: Secondary | ICD-10-CM

## 2023-07-09 DIAGNOSIS — Z9889 Other specified postprocedural states: Secondary | ICD-10-CM

## 2023-07-09 NOTE — Progress Notes (Signed)
  Subjective:  Patient ID: [name], female    DOB: 02-22-1941,  MRN: [id number]  Chief Complaint  Patient presents with   Wound Check    F/U: Ankle ulcer, right, with fat layer exposed Looks better less red starting to close    DOS: 03/15/2023 Procedure: Right ankle ulcer debridement with prep for graft, bone biopsies distal fibula, application of arthroflex dermal allograft  83 y.o. female returns for post-op check.  Patient is approximately 4 mo status post above procedures.  She reports she is doing well wound seems to be healing and have been doing mupirocin and nonadherent gauze dressing changes daily since last appointment.  Reports wound is doing well no concerns  Review of Systems: Negative except as noted in the HPI. Denies N/V/F/Ch.   Objective:  There were no vitals filed for this visit. There is no height or weight on file to calculate BMI. Constitutional Well developed. Well nourished.  Vascular Foot warm and well perfused. Capillary refill normal to all digits.  Calf is soft and supple, no posterior calf or knee pain, negative Homans' sign  Neurologic Normal speech. Oriented to person, place, and time. Epicritic sensation to light touch grossly present bilaterally.  Dermatologic Ulceration the right lateral ankle is shrinking with decreased depth as well as healing and side-to-side.  Now with scattered ulcerations versus 1 large 1 much improved from prior    Orthopedic: Tenderness to palpation noted about the surgical site.   Multiple view plain film radiographs: Deferred   Path: Ulceration sent for pathology showed ulceration with acute inflammation and granulation tissue.  No specific diagnosis given as to any type of abnormal ulceration.  Bone biopsy right ankle distal fibula resulted with benign bone and connective tissue no inflammatory changes were identified.  Culture: Corynebacterium stratum, group A strep Assessment:   1. Ankle ulcer, right, with  fat layer exposed (HCC)   2. Post-operative state        Plan:  Patient was evaluated and treated and all questions answered.  S/p foot surgery right ankle ulcer debridement with prep for graft fibular bone biopsy and dermal allograft application -Progressing as expected post-operatively wound is healing at this time,  -Continue local wound care-we will continue with piercing ointment to the wound bed and then cover with adhesive dressing -Apply every second or third day and cover with nonhearing gauze gauze wrap Ace wrap -XR: Deferred -WB Status: Weightbearing as tolerated in regular shoe -Sutures: None present -Medications: No indication for antibiotics at this time -Foot redressed with antibiotic ointment dressing.         Corinna Gab, DPM [facility] / [facility]

## 2023-07-16 ENCOUNTER — Other Ambulatory Visit: Payer: Self-pay

## 2023-07-16 ENCOUNTER — Ambulatory Visit (HOSPITAL_COMMUNITY)
Admission: RE | Admit: 2023-07-16 | Discharge: 2023-07-16 | Disposition: A | Discharge status: Home / Self Care | Source: Ambulatory Visit | Attending: Surgery | Admitting: Surgery

## 2023-07-16 ENCOUNTER — Encounter (HOSPITAL_COMMUNITY)
Admission: RE | Disposition: A | Discharge status: Home / Self Care | Payer: Self-pay | Source: Ambulatory Visit | Attending: Surgery

## 2023-07-16 DIAGNOSIS — Z79899 Other long term (current) drug therapy: Secondary | ICD-10-CM | POA: Insufficient documentation

## 2023-07-16 DIAGNOSIS — I7025 Atherosclerosis of native arteries of other extremities with ulceration: Secondary | ICD-10-CM

## 2023-07-16 DIAGNOSIS — L97519 Non-pressure chronic ulcer of other part of right foot with unspecified severity: Secondary | ICD-10-CM | POA: Insufficient documentation

## 2023-07-16 DIAGNOSIS — Y831 Surgical operation with implant of artificial internal device as the cause of abnormal reaction of the patient, or of later complication, without mention of misadventure at the time of the procedure: Secondary | ICD-10-CM | POA: Insufficient documentation

## 2023-07-16 DIAGNOSIS — Z7901 Long term (current) use of anticoagulants: Secondary | ICD-10-CM | POA: Diagnosis not present

## 2023-07-16 DIAGNOSIS — Z89412 Acquired absence of left great toe: Secondary | ICD-10-CM | POA: Insufficient documentation

## 2023-07-16 DIAGNOSIS — Z9012 Acquired absence of left breast and nipple: Secondary | ICD-10-CM | POA: Insufficient documentation

## 2023-07-16 DIAGNOSIS — I6529 Occlusion and stenosis of unspecified carotid artery: Secondary | ICD-10-CM | POA: Insufficient documentation

## 2023-07-16 DIAGNOSIS — T82858A Stenosis of vascular prosthetic devices, implants and grafts, initial encounter: Secondary | ICD-10-CM | POA: Diagnosis not present

## 2023-07-16 DIAGNOSIS — Z87891 Personal history of nicotine dependence: Secondary | ICD-10-CM | POA: Insufficient documentation

## 2023-07-16 DIAGNOSIS — Z8249 Family history of ischemic heart disease and other diseases of the circulatory system: Secondary | ICD-10-CM | POA: Insufficient documentation

## 2023-07-16 DIAGNOSIS — E7800 Pure hypercholesterolemia, unspecified: Secondary | ICD-10-CM | POA: Insufficient documentation

## 2023-07-16 DIAGNOSIS — I4891 Unspecified atrial fibrillation: Secondary | ICD-10-CM | POA: Diagnosis not present

## 2023-07-16 DIAGNOSIS — T82856A Stenosis of peripheral vascular stent, initial encounter: Secondary | ICD-10-CM | POA: Diagnosis present

## 2023-07-16 DIAGNOSIS — I70235 Atherosclerosis of native arteries of right leg with ulceration of other part of foot: Secondary | ICD-10-CM | POA: Diagnosis not present

## 2023-07-16 HISTORY — PX: LOWER EXTREMITY INTERVENTION: CATH118252

## 2023-07-16 HISTORY — PX: ABDOMINAL AORTOGRAM W/LOWER EXTREMITY: CATH118223

## 2023-07-16 HISTORY — PX: LOWER EXTREMITY ANGIOGRAPHY: CATH118251

## 2023-07-16 LAB — POCT I-STAT, CHEM 8
BUN: 28 mg/dL — ABNORMAL HIGH (ref 8–23)
BUN: 25 mg/dL — ABNORMAL HIGH (ref 8–23)
Calcium, Ion: 1.19 mmol/L (ref 1.15–1.40)
Calcium, Ion: 1.13 mmol/L — ABNORMAL LOW (ref 1.15–1.40)
Chloride: 106 mmol/L (ref 98–111)
Chloride: 107 mmol/L (ref 98–111)
Creatinine, Ser: 1.5 mg/dL — ABNORMAL HIGH (ref 0.44–1.00)
Creatinine, Ser: 1.2 mg/dL — ABNORMAL HIGH (ref 0.44–1.00)
Glucose, Bld: 99 mg/dL (ref 70–99)
Glucose, Bld: 108 mg/dL — ABNORMAL HIGH (ref 70–99)
HCT: 33 % — ABNORMAL LOW (ref 36.0–46.0)
HCT: 30 % — ABNORMAL LOW (ref 36.0–46.0)
Hemoglobin: 11.2 g/dL — ABNORMAL LOW (ref 12.0–15.0)
Hemoglobin: 10.2 g/dL — ABNORMAL LOW (ref 12.0–15.0)
Potassium: 4.5 mmol/L (ref 3.5–5.1)
Potassium: 4.5 mmol/L (ref 3.5–5.1)
Sodium: 141 mmol/L (ref 135–145)
Sodium: 140 mmol/L (ref 135–145)
TCO2: 26 mmol/L (ref 22–32)
TCO2: 23 mmol/L (ref 22–32)

## 2023-07-16 LAB — POCT ACTIVATED CLOTTING TIME
Activated Clotting Time: 199 s
Activated Clotting Time: 176 s
Activated Clotting Time: 181 s

## 2023-07-16 SURGERY — ABDOMINAL AORTOGRAM W/LOWER EXTREMITY
Anesthesia: LOCAL

## 2023-07-16 MED ORDER — SODIUM CHLORIDE 0.9 % IV SOLN: 250.0000 mL | INTRAVENOUS | Status: DC | PRN

## 2023-07-16 MED ORDER — ONDANSETRON HCL 4 MG/2ML IJ SOLN: 4.0000 mg | Freq: Four times a day (QID) | INTRAMUSCULAR | Status: DC | PRN

## 2023-07-16 MED ORDER — OXYCODONE HCL 5 MG PO TABS: 5.0000 mg | ORAL_TABLET | ORAL | Status: DC | PRN

## 2023-07-16 MED ORDER — SODIUM CHLORIDE 0.9% FLUSH: 3.0000 mL | Freq: Two times a day (BID) | INTRAVENOUS | Status: DC

## 2023-07-16 MED ORDER — HEPARIN SODIUM (PORCINE) 1000 UNIT/ML IJ SOLN
INTRAMUSCULAR | Status: DC | PRN
  Administered 2023-07-16: 4000 [IU] via INTRAVENOUS

## 2023-07-16 MED ORDER — SODIUM CHLORIDE 0.9% FLUSH: 3.0000 mL | INTRAVENOUS | Status: DC | PRN

## 2023-07-16 MED ORDER — HYDRALAZINE HCL 20 MG/ML IJ SOLN
INTRAMUSCULAR | Status: AC
  Filled 2023-07-16: qty 1

## 2023-07-16 MED ORDER — SODIUM CHLORIDE 0.9 % IV SOLN: INTRAVENOUS | Status: DC

## 2023-07-16 MED ORDER — DIPHENHYDRAMINE HCL 50 MG/ML IJ SOLN
25.0000 mg | Freq: Once | INTRAMUSCULAR | Status: AC
  Administered 2023-07-16: 25 mg via INTRAVENOUS
  Filled 2023-07-16: qty 1

## 2023-07-16 MED ORDER — HYDRALAZINE HCL 20 MG/ML IJ SOLN
INTRAMUSCULAR | Status: DC | PRN
  Administered 2023-07-16: 10 mg via INTRAVENOUS

## 2023-07-16 MED ORDER — LABETALOL HCL 5 MG/ML IV SOLN: 10.0000 mg | INTRAVENOUS | Status: DC | PRN

## 2023-07-16 MED ORDER — METHYLPREDNISOLONE SODIUM SUCC 125 MG IJ SOLR
125.0000 mg | Freq: Once | INTRAMUSCULAR | Status: AC
  Administered 2023-07-16: 125 mg via INTRAVENOUS
  Filled 2023-07-16: qty 2

## 2023-07-16 MED ORDER — FENTANYL CITRATE (PF) 100 MCG/2ML IJ SOLN
INTRAMUSCULAR | Status: DC | PRN
  Administered 2023-07-16: 50 ug via INTRAVENOUS

## 2023-07-16 MED ORDER — ACETAMINOPHEN 325 MG PO TABS: 650.0000 mg | ORAL_TABLET | ORAL | Status: DC | PRN

## 2023-07-16 MED ORDER — HEPARIN SODIUM (PORCINE) 1000 UNIT/ML IJ SOLN
INTRAMUSCULAR | Status: AC
  Filled 2023-07-16: qty 10

## 2023-07-16 MED ORDER — MORPHINE SULFATE (PF) 2 MG/ML IV SOLN: 2.0000 mg | INTRAVENOUS | Status: DC | PRN

## 2023-07-16 MED ORDER — SODIUM CHLORIDE 0.9 % WEIGHT BASED INFUSION: 1.0000 mL/kg/h | INTRAVENOUS | Status: DC

## 2023-07-16 MED ORDER — FENTANYL CITRATE (PF) 100 MCG/2ML IJ SOLN
INTRAMUSCULAR | Status: AC
  Filled 2023-07-16: qty 2

## 2023-07-16 MED ORDER — HYDRALAZINE HCL 20 MG/ML IJ SOLN: 5.0000 mg | INTRAMUSCULAR | Status: DC | PRN

## 2023-07-16 MED ORDER — MIDAZOLAM HCL 2 MG/2ML IJ SOLN
INTRAMUSCULAR | Status: DC | PRN
  Administered 2023-07-16: 1 mg via INTRAVENOUS

## 2023-07-16 MED ORDER — LIDOCAINE HCL (PF) 1 % IJ SOLN
INTRAMUSCULAR | Status: AC
  Filled 2023-07-16: qty 30

## 2023-07-16 MED ORDER — LIDOCAINE HCL (PF) 1 % IJ SOLN
INTRAMUSCULAR | Status: DC | PRN
  Administered 2023-07-16: 15 mL via INTRADERMAL

## 2023-07-16 MED ORDER — IODIXANOL 320 MG/ML IV SOLN
INTRAVENOUS | Status: DC | PRN
Start: 2023-07-16 — End: 2023-07-16
  Administered 2023-07-16: 95 mL

## 2023-07-16 MED ORDER — MIDAZOLAM HCL 2 MG/2ML IJ SOLN
INTRAMUSCULAR | Status: AC
  Filled 2023-07-16: qty 2

## 2023-07-16 MED ORDER — HEPARIN (PORCINE) IN NACL 1000-0.9 UT/500ML-% IV SOLN
INTRAVENOUS | Status: DC | PRN
Start: 2023-07-16 — End: 2023-07-16
  Administered 2023-07-16 (×2): 500 mL

## 2023-07-16 SURGICAL SUPPLY — 19 items
BALLN STERLING OTW 6X80X135 (BALLOONS) ×2 IMPLANT
BALLOON STERLING OTW 6X80X135 (BALLOONS) IMPLANT
CATH AURYON ATHERECTOMY 2.0 (CATHETERS) IMPLANT
CATH OMNI FLUSH 5F 65CM (CATHETERS) IMPLANT
COVER DOME SNAP 22 D (MISCELLANEOUS) IMPLANT
DCB RANGER 5.0X200 150 (BALLOONS) IMPLANT
KIT ENCORE 40 (KITS) IMPLANT
KIT MICROPUNCTURE NIT STIFF (SHEATH) IMPLANT
KIT SINGLE USE MANIFOLD (KITS) IMPLANT
KIT SYRINGE INJ CVI SPIKEX1 (MISCELLANEOUS) IMPLANT
RANGER DCB 5.0X200 150 (BALLOONS) ×2 IMPLANT
SET ATX-X65L (MISCELLANEOUS) IMPLANT
SHEATH CATAPULT 6F 45 MP (SHEATH) IMPLANT
SHEATH PINNACLE 5F 10CM (SHEATH) IMPLANT
SHEATH PINNACLE 6F 10CM (SHEATH) IMPLANT
SHEATH PROBE COVER 6X72 (BAG) IMPLANT
TRAY PV CATH (CUSTOM PROCEDURE TRAY) ×2 IMPLANT
WIRE BENTSON .035X145CM (WIRE) IMPLANT
WIRE SPARTACORE .014X300CM (WIRE) IMPLANT

## 2023-07-16 NOTE — Progress Notes (Signed)
 19fr sheath removed intact from right femoral artery. Manual pressures applied x .  No bleeding or hematoma noted post sheath pull. 4x4 gauze dressing applied to site. Post activity and precautions explained; patient verbalized understanding. VSS. Will continue to monitor per orders and protocol.[name]

## 2023-07-16 NOTE — Op Note (Signed)
 Patient name: [name] MRN: [id number] DOB: 30-Nov-1940 Sex: female  07/16/2023 Pre-operative Diagnosis: IN-Stent stenosis Post-operative diagnosis:  Same Surgeon:  [name] Procedure Performed:  1.  Ultrasound-guided access, right femoral artery  2.  Abdominal aortogram  3.  Bilateral lower extremity runoff  4.  Small to injection with catheter in left SFA  5.  Laser atherectomy left superficial femoral artery  6.  Drug-coated balloon angioplasty, left superficial femoral artery  7.  Conscious sedation, 52 minutes   Indications: This is an 83 year old female with history of bilateral lower extremity interventions for wounds.  She currently has a right foot wound but has high-grade stenosis in her left leg so she comes in today for further evaluation.  Procedure:  The patient was identified in the holding area and taken to room 8.  The patient was then placed supine on the table and prepped and draped in the usual sterile fashion.  A time out was called.  Conscious sedation was administered with the use of IV fentanyl and Versed under continuous physician and nurse monitoring.  Heart rate, blood pressure, and oxygen saturation were continuously monitored.  Total sedation time was 52 minutes.  Ultrasound was used to evaluate the right common femoral artery.  It was patent .  A digital ultrasound image was acquired.  A micropuncture needle was used to access the right common femoral artery under ultrasound guidance.  An 018 wire was advanced without resistance and a micropuncture sheath was placed.  The 018 wire was removed and a benson wire was placed.  The micropuncture sheath was exchanged for a 5 french sheath.  An omniflush catheter was advanced over the wire to the level of L-1.  An abdominal angiogram was obtained.  Next, using the omniflush catheter and a benson wire, the aortic bifurcation was crossed and the catheter was placed into theleft external iliac artery and left runoff  was obtained.  right runoff was performed via retrograde sheath injections.  Findings:   Aortogram: No significant renal artery stenosis.  The infrarenal abdominal aorta is widely patent.  The bilateral common and external iliac arteries are widely patent.  Right Lower Extremity: Right common femoral profundofemoral artery are widely patent.  Superficial femoral artery has several areas of mild to moderate stenosis, all less than 50%.  There is two-vessel runoff via the anterior tibial and peroneal artery  Left Lower Extremity: Left common femoral profundofemoral artery widely patent.  There is a stent within the superficial femoral artery with an area of greater than 90% stenosis.  The popliteal artery is patent throughout its course.  The dominant runoff is the peroneal artery however a anterior tibial does appear to cross the ankle  Intervention: After the above images were acquired the decision made to proceed with intervention.  A 6 French 45 cm sheath was advanced over the bifurcation and placed into the proximal left superficial femoral artery.  Additional selective images were performed to better evaluate the anatomy.  The patient had a greater than 90% in-stent stenosis.  I felt laser atherectomy to treat the in-stent stenosis was most appropriate.  A [place] core wire was advanced across the lesion.  A 2.0 laser catheter was used to perform atherectomy of the in-stent stenosis.  2 passes were made and then I used a 5 x 200 drug-coated Ranger balloon to perform balloon angioplasty.  On completion imaging, there did appear to be some haziness around the area of high-grade stenosis and so I  inserted a 6 x 80 balloon and treated this.  Follow-up imaging showed widely patent superficial femoral-popliteal artery with no significant change in runoff.  The long sheath was exchanged out for a short 6 [place] sheath.  I did not use a closure device.  She will be taken to holding it for sheath  pull.  Impression:  #1  Successful laser atherectomy of in-stent stenosis in the left superficial femoral artery and subsequent treatment using a 5 x 200 Ranger balloon and a 6 x 80 [place] balloon  V. [name], M.D., [facility] [place] Office: [phone] Pager:  [phone]

## 2023-07-16 NOTE — Interval H&P Note (Signed)
 History and Physical Interval Note:  07/16/2023 7:32 AM  [name]  has presented today for surgery, with the diagnosis of atherosclerosis biltateral  lower extremity with ulceration.  The various methods of treatment have been discussed with the patient and family. After consideration of risks, benefits and other options for treatment, the patient has consented to  Procedure(s): ABDOMINAL AORTOGRAM W/LOWER EXTREMITY (N/A) Lower Extremity Angiography (N/A) LOWER EXTREMITY INTERVENTION (N/A) as a surgical intervention.  The patient's history has been reviewed, patient examined, no change in status, stable for surgery.  I have reviewed the patient's chart and labs.  Questions were answered to the patient's satisfaction.     [name]

## 2023-07-16 NOTE — Progress Notes (Signed)
 Up and walked and tolerated well; right groin stable, no bleeding or hematoma

## 2023-07-16 NOTE — Discharge Instructions (Signed)
 Start Eliquis in am

## 2023-07-17 ENCOUNTER — Encounter (HOSPITAL_COMMUNITY): Payer: Self-pay | Admitting: Surgery

## 2023-07-23 ENCOUNTER — Telehealth: Payer: Self-pay | Admitting: Cardiovascular Disease

## 2023-07-23 NOTE — Telephone Encounter (Signed)
 Spoke with pt's husband (per DPR) regarding her medication. Pt's husband stated that he had contacted the pt's PCP about the issue but had not heard back. He is concerned that there are a lot interactions indicated with the other medications the pt is taking. He is also concerned that it is making the pt feel sick to her stomach. Pt's husband stated he had her stop taking the medication on Sunday. Pt was told to follow up with pt's PCP but that his concerns would be forwarded to our pharmacist to review. Husband verbalized understanding. All questions, if any, were answered.

## 2023-07-23 NOTE — Telephone Encounter (Signed)
 Pt c/o medication issue:  1. Name of Medication: levothyroxine (SYNTHROID) 25 MCG tablet   2. How are you currently taking this medication (dosage and times per day)? N/A  3. Are you having a reaction (difficulty breathing--STAT)? N/A  4. What is your medication issue? PCP prescribed this medication and her husband would like a c/b due to the pt feeling sick to her stomach and her BP not staying consistent. Please advise

## 2023-07-24 ENCOUNTER — Encounter (HOSPITAL_COMMUNITY): Payer: Self-pay

## 2023-07-24 ENCOUNTER — Other Ambulatory Visit: Payer: Self-pay

## 2023-07-24 ENCOUNTER — Emergency Department (HOSPITAL_COMMUNITY)

## 2023-07-24 ENCOUNTER — Inpatient Hospital Stay (HOSPITAL_COMMUNITY)
Admission: EM | Admit: 2023-07-24 | Discharge: 2023-07-27 | DRG: 193 | Disposition: A | Discharge status: Home Health | Attending: Internal Medicine | Admitting: Internal Medicine

## 2023-07-24 DIAGNOSIS — J189 Pneumonia, unspecified organism: Secondary | ICD-10-CM

## 2023-07-24 DIAGNOSIS — Z79899 Other long term (current) drug therapy: Secondary | ICD-10-CM

## 2023-07-24 DIAGNOSIS — I509 Heart failure, unspecified: Principal | ICD-10-CM

## 2023-07-24 DIAGNOSIS — I4819 Other persistent atrial fibrillation: Secondary | ICD-10-CM | POA: Diagnosis present

## 2023-07-24 DIAGNOSIS — G250 Essential tremor: Secondary | ICD-10-CM | POA: Diagnosis present

## 2023-07-24 DIAGNOSIS — Z9013 Acquired absence of bilateral breasts and nipples: Secondary | ICD-10-CM

## 2023-07-24 DIAGNOSIS — M810 Age-related osteoporosis without current pathological fracture: Secondary | ICD-10-CM | POA: Diagnosis present

## 2023-07-24 DIAGNOSIS — Z89422 Acquired absence of other left toe(s): Secondary | ICD-10-CM

## 2023-07-24 DIAGNOSIS — I5023 Acute on chronic systolic (congestive) heart failure: Secondary | ICD-10-CM | POA: Diagnosis present

## 2023-07-24 DIAGNOSIS — Z87891 Personal history of nicotine dependence: Secondary | ICD-10-CM

## 2023-07-24 DIAGNOSIS — I7025 Atherosclerosis of native arteries of other extremities with ulceration: Secondary | ICD-10-CM

## 2023-07-24 DIAGNOSIS — Z8673 Personal history of transient ischemic attack (TIA), and cerebral infarction without residual deficits: Secondary | ICD-10-CM

## 2023-07-24 DIAGNOSIS — Z681 Body mass index (BMI) 19 or less, adult: Secondary | ICD-10-CM

## 2023-07-24 DIAGNOSIS — Z7901 Long term (current) use of anticoagulants: Secondary | ICD-10-CM

## 2023-07-24 DIAGNOSIS — Z9841 Cataract extraction status, right eye: Secondary | ICD-10-CM

## 2023-07-24 DIAGNOSIS — Z91048 Other nonmedicinal substance allergy status: Secondary | ICD-10-CM

## 2023-07-24 DIAGNOSIS — J181 Lobar pneumonia, unspecified organism: Secondary | ICD-10-CM | POA: Diagnosis not present

## 2023-07-24 DIAGNOSIS — Z79890 Hormone replacement therapy (postmenopausal): Secondary | ICD-10-CM

## 2023-07-24 DIAGNOSIS — I130 Hypertensive heart and chronic kidney disease with heart failure and stage 1 through stage 4 chronic kidney disease, or unspecified chronic kidney disease: Secondary | ICD-10-CM | POA: Diagnosis present

## 2023-07-24 DIAGNOSIS — M549 Dorsalgia, unspecified: Secondary | ICD-10-CM | POA: Diagnosis present

## 2023-07-24 DIAGNOSIS — Z888 Allergy status to other drugs, medicaments and biological substances status: Secondary | ICD-10-CM

## 2023-07-24 DIAGNOSIS — Z66 Do not resuscitate: Secondary | ICD-10-CM | POA: Diagnosis present

## 2023-07-24 DIAGNOSIS — Z9842 Cataract extraction status, left eye: Secondary | ICD-10-CM

## 2023-07-24 DIAGNOSIS — N1832 Chronic kidney disease, stage 3b: Secondary | ICD-10-CM | POA: Diagnosis present

## 2023-07-24 DIAGNOSIS — R636 Underweight: Secondary | ICD-10-CM | POA: Diagnosis present

## 2023-07-24 DIAGNOSIS — J470 Bronchiectasis with acute lower respiratory infection: Secondary | ICD-10-CM | POA: Diagnosis present

## 2023-07-24 DIAGNOSIS — Z8249 Family history of ischemic heart disease and other diseases of the circulatory system: Secondary | ICD-10-CM

## 2023-07-24 DIAGNOSIS — R0902 Hypoxemia: Secondary | ICD-10-CM

## 2023-07-24 DIAGNOSIS — I10 Essential (primary) hypertension: Secondary | ICD-10-CM | POA: Diagnosis present

## 2023-07-24 DIAGNOSIS — Z853 Personal history of malignant neoplasm of breast: Secondary | ICD-10-CM

## 2023-07-24 DIAGNOSIS — J440 Chronic obstructive pulmonary disease with acute lower respiratory infection: Secondary | ICD-10-CM | POA: Diagnosis present

## 2023-07-24 DIAGNOSIS — G8929 Other chronic pain: Secondary | ICD-10-CM | POA: Diagnosis present

## 2023-07-24 DIAGNOSIS — Z882 Allergy status to sulfonamides status: Secondary | ICD-10-CM

## 2023-07-24 DIAGNOSIS — E785 Hyperlipidemia, unspecified: Secondary | ICD-10-CM | POA: Diagnosis present

## 2023-07-24 DIAGNOSIS — I739 Peripheral vascular disease, unspecified: Secondary | ICD-10-CM | POA: Diagnosis present

## 2023-07-24 DIAGNOSIS — J9601 Acute respiratory failure with hypoxia: Secondary | ICD-10-CM | POA: Diagnosis present

## 2023-07-24 DIAGNOSIS — Z1152 Encounter for screening for COVID-19: Secondary | ICD-10-CM

## 2023-07-24 DIAGNOSIS — Z7902 Long term (current) use of antithrombotics/antiplatelets: Secondary | ICD-10-CM

## 2023-07-24 LAB — CBC WITH DIFFERENTIAL/PLATELET
Abs Immature Granulocytes: 0.06 10*3/uL (ref 0.00–0.07)
Basophils Absolute: 0 10*3/uL (ref 0.0–0.1)
Basophils Relative: 0 %
Eosinophils Absolute: 0.1 10*3/uL (ref 0.0–0.5)
Eosinophils Relative: 1 %
HCT: 30.6 % — ABNORMAL LOW (ref 36.0–46.0)
Hemoglobin: 9.5 g/dL — ABNORMAL LOW (ref 12.0–15.0)
Immature Granulocytes: 1 %
Lymphocytes Relative: 7 %
Lymphs Abs: 0.8 10*3/uL (ref 0.7–4.0)
MCH: 32.8 pg (ref 26.0–34.0)
MCHC: 31 g/dL (ref 30.0–36.0)
MCV: 105.5 fL — ABNORMAL HIGH (ref 80.0–100.0)
Monocytes Absolute: 1.4 10*3/uL — ABNORMAL HIGH (ref 0.1–1.0)
Monocytes Relative: 11 %
Neutro Abs: 9.8 10*3/uL — ABNORMAL HIGH (ref 1.7–7.7)
Neutrophils Relative %: 80 %
Platelets: 341 10*3/uL (ref 150–400)
RBC: 2.9 MIL/uL — ABNORMAL LOW (ref 3.87–5.11)
RDW: 15.9 % — ABNORMAL HIGH (ref 11.5–15.5)
WBC: 12.1 10*3/uL — ABNORMAL HIGH (ref 4.0–10.5)
nRBC: 0.2 % (ref 0.0–0.2)

## 2023-07-24 NOTE — ED Triage Notes (Signed)
 Pt reports:  "I'm out of breathe." "When I leave down in particular." Started today

## 2023-07-24 NOTE — ED Provider Notes (Signed)
 [place] [facility]PART[facility] AT [facility] Provider Note   CSN: [id number] Arrival date & time: 07/24/23  2128     History  Chief Complaint  Patient presents with   Shortness of Breath    [name] is a 83 y.o. female.  HPI 83 year old female presents with shortness of breath.  Seems to primarily be when lying flat over the last few days.  She denies cough, fever, chest pain.  She did recently have an abdominal aortogram and angioplasty to her femoral artery on 4/8.  That leg is doing better.  She was also started on levothyroxine and her dose was increased over the last couple weeks.  Husband is concerned this is contributing to her shortness of breath.  She denies any significant leg swelling.  Home Medications Prior to Admission medications   Medication Sig Start Date End Date Taking? Authorizing Provider  acetaminophen (TYLENOL) 325 MG tablet Take 2 tablets (650 mg total) by mouth every 6 (six) hours as needed for mild pain (pain score 1-3) (or Fever >/= 101). 03/20/23   Azucena Fallen, MD  albuterol (VENTOLIN HFA) 108 (90 Base) MCG/ACT inhaler Inhale 2 puffs into the lungs every 6 (six) hours as needed for wheezing or shortness of breath. 11/28/22   Kalman Shan, MD  AMBULATORY NON FORMULARY MEDICATION Dispense one Readi Steadi glove 09/25/22   Tat, Octaviano Batty, DO  amiodarone (PACERONE) 200 MG tablet Take 0.5 tablets (100 mg total) by mouth every other day. Patient taking differently: Take 200 mg by mouth daily. 10/22/22   Wendall Stade, MD  apixaban (ELIQUIS) 2.5 MG TABS tablet Take 1 tablet (2.5 mg total) by mouth 2 (two) times daily. 07/23/22   Wendall Stade, MD  ascorbic acid (VITAMIN C) 500 MG tablet Take 500 mg by mouth in the morning.    [provider]  CALCIUM PO Take 1,200 mg by mouth 2 (two) times daily. 600 mg each    [provider]  Camphor-Menthol-Methyl Sal (SALONPAS) 3.04-14-08 % PTCH Place 1 patch onto the skin daily as  needed (pain.).    [provider]  clopidogrel (PLAVIX) 75 MG tablet TAKE 1 TABLET EVERY MORNING 10/30/22   Wendall Stade, MD  denosumab (PROLIA) 60 MG/ML SOSY injection Inject 60 mg into the skin every 6 (six) months.    Benita Stabile, MD  Ferrous Sulfate (IRON PO) Take 65 mg by mouth at bedtime.    [provider]  furosemide (LASIX) 20 MG tablet Take 2 tablets (40 mg total) by mouth daily. Patient taking differently: Take 20 mg by mouth 2 (two) times daily. 11/26/22   Swinyer, Zachary George, NP  levothyroxine (SYNTHROID) 50 MCG tablet Take 50 mcg by mouth daily before breakfast. 07/12/23   [provider]  linezolid (ZYVOX) 600 MG tablet Take 1 tablet (600 mg total) by mouth 2 (two) times daily. Patient not taking: Reported on 07/10/2023 03/27/23   Judyann Munson, MD  MAGNESIUM OXIDE PO Take 500 mg by mouth every morning.    [provider]  metoprolol tartrate (LOPRESSOR) 25 MG tablet Take 3 tablets (75 mg total) by mouth 2 (two) times daily. 11/20/22   Wendall Stade, MD  Multiple Vitamin (MULTIVITAMIN WITH MINERALS) TABS tablet Take 1 tablet by mouth every morning.    [provider]  mupirocin ointment (BACTROBAN) 2 % Apply 1 Application topically daily. 04/17/23   Standiford, Jenelle Mages, DPM  nystatin (MYCOSTATIN) 100000 UNIT/ML suspension Take  5 mLs (500,000 Units total) by mouth 4 (four) times daily. Patient not taking: Reported on 07/10/2023 04/17/23   Judyann Munson, MD  pantoprazole (PROTONIX) 40 MG tablet Take 1 tablet (40 mg total) by mouth daily. 10/12/22   Wendall Stade, MD  potassium chloride (KLOR-CON M) 10 MEQ tablet Take 2 tablets (20 mEq total) by mouth daily. Patient taking differently: Take 10 mEq by mouth daily. 11/26/22   Swinyer, Zachary George, NP  pravastatin (PRAVACHOL) 40 MG tablet TAKE 1 TABLET DAILY Patient taking differently: Take 40 mg by mouth every evening. 08/27/22   Wendall Stade, MD  SANTYL 250 UNIT/GM ointment APPLY TO  AFFECTED AREAS ONCE DAILY. Patient not taking: Reported on 07/10/2023 03/30/22   Candelaria Stagers, DPM  silver sulfADIAZINE (SILVADENE) 1 % cream Apply 1 Application topically daily. Patient not taking: Reported on 07/10/2023 04/16/23   Standiford, Jenelle Mages, DPM  umeclidinium bromide (INCRUSE ELLIPTA) 62.5 MCG/ACT AEPB Inhale 1 puff into the lungs daily. 06/25/22   Kalman Shan, MD      Allergies    Iodine, Iohexol, Petroleum gauze non-woven 3x9" [wound dressings], Sulfa antibiotics, Wound dressing adhesive, and Tape    Review of Systems   Review of Systems  Respiratory:  Positive for shortness of breath. Negative for cough.   Cardiovascular:  Negative for chest pain and leg swelling.  Gastrointestinal:  Negative for abdominal distention.    Physical Exam Updated Vital Signs BP (!) 171/80   Pulse (!) 111   Temp 97.7 F (36.5 C) (Oral)   Resp 18   Ht 5\' 2"  (1.575 m)   Wt 36.3 kg   SpO2 (!) 83%   BMI 14.63 kg/m  Physical Exam Vitals and nursing note reviewed.  Constitutional:      Appearance: She is well-developed.  HENT:     Head: Normocephalic and atraumatic.  Neck:     Vascular: JVD present.  Cardiovascular:     Rate and Rhythm: Normal rate. Rhythm irregular.     Heart sounds: Normal heart sounds.  Pulmonary:     Effort: Pulmonary effort is normal. No tachypnea, accessory muscle usage or respiratory distress.     Breath sounds: Decreased breath sounds present.  Abdominal:     Palpations: Abdomen is soft.     Tenderness: There is no abdominal tenderness.  Musculoskeletal:     Comments: Trace edema in both lower extremities  Skin:    General: Skin is warm and dry.  Neurological:     Mental Status: She is alert.     ED Results / Procedures / Treatments   Labs (all labs ordered are listed, but only abnormal results are displayed) Labs Reviewed  COMPREHENSIVE METABOLIC PANEL WITH GFR  CBC WITH DIFFERENTIAL/PLATELET  BRAIN NATRIURETIC PEPTIDE  TROPONIN I (HIGH  SENSITIVITY)    EKG EKG Interpretation Date/Time:  Wednesday July 24 2023 22:54:36 EDT Ventricular Rate:  58 PR Interval:  148 QRS Duration:  108 QT Interval:  543 QTC Calculation: 534 R Axis:   52  Text Interpretation: Sinus rhythm Anteroseptal infarct, age indeterminate Prolonged QT interval similar to Aug 2024 Confirmed by Pricilla Loveless [phone]) on 07/24/2023 11:08:02 PM  Radiology No results found.  Procedures Procedures    Medications Ordered in ED Medications - No data to display  ED Course/ Medical Decision Making/ A&P  Medical Decision Making Amount and/or Complexity of Data Reviewed Labs: ordered. Radiology: ordered and independent interpretation performed.    Details: CHF ECG/medicine tests: ordered and independent interpretation performed.    Details: No change from baseline   I suspect patient is having a CHF exacerbation.  She has JVD and a little bit of lower extremity edema.  She became hypoxic into the 80s with just transferring from the wheelchair to the stretcher and was placed on oxygen.  Labs are currently pending.  Care transferred to Dr. Lula Sale        Final Clinical Impression(s) / ED Diagnoses Final diagnoses:  None    Rx / DC Orders ED Discharge Orders     None         Jerilynn Montenegro, MD 07/24/23 2328

## 2023-07-24 NOTE — Telephone Encounter (Signed)
 Spoke with husband.   He notes she did fine on the 25 mcg levothyroxine, but when the dose was doubled, she developed side effects.  Explained that the medication is replacing hormone that her body isn't making and encouraged him to go back on at least the 25 mcg dose until they hear from Dr. Del Favia.  Also encouraged that she take first thing in the morning then wait 30 minutes to eat and take other meds.  Also keep iron and calcium at least 4 hours apart from the levothyroxine.  Mr. Crothers voiced understanding, will restart levothyroxine and wait to hear from Dr. Del Favia.

## 2023-07-25 ENCOUNTER — Inpatient Hospital Stay (HOSPITAL_COMMUNITY)

## 2023-07-25 DIAGNOSIS — J181 Lobar pneumonia, unspecified organism: Secondary | ICD-10-CM | POA: Diagnosis present

## 2023-07-25 DIAGNOSIS — I130 Hypertensive heart and chronic kidney disease with heart failure and stage 1 through stage 4 chronic kidney disease, or unspecified chronic kidney disease: Secondary | ICD-10-CM | POA: Diagnosis present

## 2023-07-25 DIAGNOSIS — Z1152 Encounter for screening for COVID-19: Secondary | ICD-10-CM | POA: Diagnosis not present

## 2023-07-25 DIAGNOSIS — I739 Peripheral vascular disease, unspecified: Secondary | ICD-10-CM

## 2023-07-25 DIAGNOSIS — N1832 Chronic kidney disease, stage 3b: Secondary | ICD-10-CM | POA: Diagnosis present

## 2023-07-25 DIAGNOSIS — I5023 Acute on chronic systolic (congestive) heart failure: Secondary | ICD-10-CM

## 2023-07-25 DIAGNOSIS — R0902 Hypoxemia: Secondary | ICD-10-CM | POA: Diagnosis present

## 2023-07-25 DIAGNOSIS — R636 Underweight: Secondary | ICD-10-CM | POA: Diagnosis present

## 2023-07-25 DIAGNOSIS — E785 Hyperlipidemia, unspecified: Secondary | ICD-10-CM | POA: Diagnosis present

## 2023-07-25 DIAGNOSIS — M549 Dorsalgia, unspecified: Secondary | ICD-10-CM | POA: Diagnosis present

## 2023-07-25 DIAGNOSIS — Z66 Do not resuscitate: Secondary | ICD-10-CM | POA: Diagnosis present

## 2023-07-25 DIAGNOSIS — Z8673 Personal history of transient ischemic attack (TIA), and cerebral infarction without residual deficits: Secondary | ICD-10-CM | POA: Diagnosis not present

## 2023-07-25 DIAGNOSIS — G8929 Other chronic pain: Secondary | ICD-10-CM | POA: Diagnosis present

## 2023-07-25 DIAGNOSIS — I5033 Acute on chronic diastolic (congestive) heart failure: Secondary | ICD-10-CM | POA: Diagnosis not present

## 2023-07-25 DIAGNOSIS — Z853 Personal history of malignant neoplasm of breast: Secondary | ICD-10-CM | POA: Diagnosis not present

## 2023-07-25 DIAGNOSIS — Z79890 Hormone replacement therapy (postmenopausal): Secondary | ICD-10-CM | POA: Diagnosis not present

## 2023-07-25 DIAGNOSIS — Z8249 Family history of ischemic heart disease and other diseases of the circulatory system: Secondary | ICD-10-CM | POA: Diagnosis not present

## 2023-07-25 DIAGNOSIS — Z7901 Long term (current) use of anticoagulants: Secondary | ICD-10-CM | POA: Diagnosis not present

## 2023-07-25 DIAGNOSIS — Z681 Body mass index (BMI) 19 or less, adult: Secondary | ICD-10-CM | POA: Diagnosis not present

## 2023-07-25 DIAGNOSIS — G250 Essential tremor: Secondary | ICD-10-CM | POA: Diagnosis present

## 2023-07-25 DIAGNOSIS — J9601 Acute respiratory failure with hypoxia: Secondary | ICD-10-CM | POA: Diagnosis present

## 2023-07-25 DIAGNOSIS — I4819 Other persistent atrial fibrillation: Secondary | ICD-10-CM | POA: Diagnosis present

## 2023-07-25 DIAGNOSIS — M810 Age-related osteoporosis without current pathological fracture: Secondary | ICD-10-CM | POA: Diagnosis present

## 2023-07-25 DIAGNOSIS — I509 Heart failure, unspecified: Secondary | ICD-10-CM

## 2023-07-25 DIAGNOSIS — J470 Bronchiectasis with acute lower respiratory infection: Secondary | ICD-10-CM | POA: Diagnosis present

## 2023-07-25 DIAGNOSIS — J440 Chronic obstructive pulmonary disease with acute lower respiratory infection: Secondary | ICD-10-CM | POA: Diagnosis present

## 2023-07-25 DIAGNOSIS — Z87891 Personal history of nicotine dependence: Secondary | ICD-10-CM | POA: Diagnosis not present

## 2023-07-25 LAB — COMPREHENSIVE METABOLIC PANEL WITH GFR
ALT: 42 U/L (ref 0–44)
AST: 35 U/L (ref 15–41)
Albumin: 3 g/dL — ABNORMAL LOW (ref 3.5–5.0)
Alkaline Phosphatase: 139 U/L — ABNORMAL HIGH (ref 38–126)
Anion gap: 15 (ref 5–15)
BUN: 30 mg/dL — ABNORMAL HIGH (ref 8–23)
CO2: 22 mmol/L (ref 22–32)
Calcium: 9 mg/dL (ref 8.9–10.3)
Chloride: 103 mmol/L (ref 98–111)
Creatinine, Ser: 1.42 mg/dL — ABNORMAL HIGH (ref 0.44–1.00)
GFR, Estimated: 36 mL/min — ABNORMAL LOW (ref >60)
Glucose, Bld: 107 mg/dL — ABNORMAL HIGH (ref 70–99)
Potassium: 4.6 mmol/L (ref 3.5–5.1)
Sodium: 140 mmol/L (ref 135–145)
Total Bilirubin: 0.8 mg/dL (ref 0.0–1.2)
Total Protein: 6.8 g/dL (ref 6.5–8.1)

## 2023-07-25 LAB — RESP PANEL BY RT-PCR (RSV, FLU A&B, COVID)  RVPGX2
Influenza A by PCR: NEGATIVE
Influenza B by PCR: NEGATIVE
Resp Syncytial Virus by PCR: NEGATIVE
SARS Coronavirus 2 by RT PCR: NEGATIVE

## 2023-07-25 LAB — BLOOD GAS, VENOUS
Acid-Base Excess: 4.1 mmol/L — ABNORMAL HIGH (ref 0.0–2.0)
Bicarbonate: 28.5 mmol/L — ABNORMAL HIGH (ref 20.0–28.0)
Drawn by: 7012
O2 Saturation: 39.8 %
Patient temperature: 36.4
pCO2, Ven: 40 mmHg — ABNORMAL LOW (ref 44–60)
pH, Ven: 7.46 — ABNORMAL HIGH (ref 7.25–7.43)
pO2, Ven: <31 mmHg — CL (ref 32–45)

## 2023-07-25 LAB — RESPIRATORY PANEL BY PCR

## 2023-07-25 LAB — PROCALCITONIN: Procalcitonin: 0.1 ng/mL

## 2023-07-25 LAB — TROPONIN I (HIGH SENSITIVITY)
Troponin I (High Sensitivity): 18 ng/L — ABNORMAL HIGH (ref <18)
Troponin I (High Sensitivity): 17 ng/L (ref <18)

## 2023-07-25 LAB — BRAIN NATRIURETIC PEPTIDE: B Natriuretic Peptide: 3972 pg/mL — ABNORMAL HIGH (ref 0.0–100.0)

## 2023-07-25 MED ORDER — AZITHROMYCIN 250 MG PO TABS
500.0000 mg | ORAL_TABLET | Freq: Every day | ORAL | Status: DC
  Administered 2023-07-25 – 2023-07-26 (×2): 500 mg via ORAL
  Filled 2023-07-25 (×2): qty 2

## 2023-07-25 MED ORDER — MUPIROCIN 2 % EX OINT
TOPICAL_OINTMENT | CUTANEOUS | Status: DC
  Filled 2023-07-25: qty 22

## 2023-07-25 MED ORDER — ACETAMINOPHEN 325 MG PO TABS: 650.0000 mg | ORAL_TABLET | ORAL | Status: DC | PRN

## 2023-07-25 MED ORDER — PANTOPRAZOLE SODIUM 40 MG PO TBEC
40.0000 mg | DELAYED_RELEASE_TABLET | Freq: Every day | ORAL | Status: DC
  Administered 2023-07-25 – 2023-07-27 (×3): 40 mg via ORAL
  Filled 2023-07-25 (×3): qty 1

## 2023-07-25 MED ORDER — SODIUM CHLORIDE 0.9 % IV SOLN
250.0000 mL | INTRAVENOUS | Status: AC | PRN
Start: 2023-07-25 — End: 2023-07-26

## 2023-07-25 MED ORDER — PRAVASTATIN SODIUM 40 MG PO TABS
40.0000 mg | ORAL_TABLET | Freq: Every evening | ORAL | Status: DC
  Administered 2023-07-25 – 2023-07-26 (×2): 40 mg via ORAL
  Filled 2023-07-25 (×2): qty 1

## 2023-07-25 MED ORDER — SODIUM CHLORIDE 0.9% FLUSH
3.0000 mL | Freq: Two times a day (BID) | INTRAVENOUS | Status: DC
  Administered 2023-07-25 – 2023-07-27 (×4): 3 mL via INTRAVENOUS

## 2023-07-25 MED ORDER — FUROSEMIDE 10 MG/ML IJ SOLN
20.0000 mg | Freq: Once | INTRAMUSCULAR | Status: AC
  Administered 2023-07-25: 20 mg via INTRAVENOUS
  Filled 2023-07-25: qty 2

## 2023-07-25 MED ORDER — AMIODARONE HCL 200 MG PO TABS
200.0000 mg | ORAL_TABLET | Freq: Every day | ORAL | Status: DC
  Administered 2023-07-25 – 2023-07-27 (×3): 200 mg via ORAL
  Filled 2023-07-25 (×3): qty 1

## 2023-07-25 MED ORDER — METOPROLOL TARTRATE 50 MG PO TABS
75.0000 mg | ORAL_TABLET | Freq: Two times a day (BID) | ORAL | Status: DC
  Administered 2023-07-25 – 2023-07-27 (×4): 75 mg via ORAL
  Filled 2023-07-25 (×4): qty 1

## 2023-07-25 MED ORDER — SODIUM CHLORIDE 0.9 % IV SOLN
1.0000 g | INTRAVENOUS | Status: DC
  Administered 2023-07-25 – 2023-07-26 (×2): 1 g via INTRAVENOUS
  Filled 2023-07-25 (×2): qty 10

## 2023-07-25 MED ORDER — SODIUM CHLORIDE 0.9% FLUSH: 3.0000 mL | INTRAVENOUS | Status: DC | PRN

## 2023-07-25 MED ORDER — APIXABAN 2.5 MG PO TABS
2.5000 mg | ORAL_TABLET | Freq: Two times a day (BID) | ORAL | Status: DC
  Administered 2023-07-25 – 2023-07-27 (×4): 2.5 mg via ORAL
  Filled 2023-07-25 (×4): qty 1

## 2023-07-25 MED ORDER — SODIUM CHLORIDE 0.9 % IV SOLN
500.0000 mg | Freq: Once | INTRAVENOUS | Status: AC
  Administered 2023-07-25: 500 mg via INTRAVENOUS
  Filled 2023-07-25: qty 5

## 2023-07-25 MED ORDER — ADULT MULTIVITAMIN W/MINERALS CH
1.0000 | ORAL_TABLET | Freq: Every morning | ORAL | Status: DC
  Administered 2023-07-25 – 2023-07-27 (×3): 1 via ORAL
  Filled 2023-07-25 (×3): qty 1

## 2023-07-25 MED ORDER — IPRATROPIUM-ALBUTEROL 0.5-2.5 (3) MG/3ML IN SOLN
3.0000 mL | Freq: Three times a day (TID) | RESPIRATORY_TRACT | Status: DC
  Administered 2023-07-26 – 2023-07-27 (×4): 3 mL via RESPIRATORY_TRACT
  Filled 2023-07-25 (×4): qty 3

## 2023-07-25 MED ORDER — CLOPIDOGREL BISULFATE 75 MG PO TABS
75.0000 mg | ORAL_TABLET | Freq: Every morning | ORAL | Status: DC
  Administered 2023-07-25 – 2023-07-27 (×3): 75 mg via ORAL
  Filled 2023-07-25 (×3): qty 1

## 2023-07-25 MED ORDER — IPRATROPIUM-ALBUTEROL 0.5-2.5 (3) MG/3ML IN SOLN
3.0000 mL | Freq: Three times a day (TID) | RESPIRATORY_TRACT | Status: DC
  Administered 2023-07-25 (×2): 3 mL via RESPIRATORY_TRACT
  Filled 2023-07-25 (×2): qty 3

## 2023-07-25 MED ORDER — ONDANSETRON HCL 4 MG/2ML IJ SOLN
4.0000 mg | Freq: Four times a day (QID) | INTRAMUSCULAR | Status: DC | PRN
  Administered 2023-07-26: 4 mg via INTRAVENOUS
  Filled 2023-07-25: qty 2

## 2023-07-25 MED ORDER — SODIUM CHLORIDE 0.9 % IV SOLN
1.0000 g | Freq: Once | INTRAVENOUS | Status: AC
  Administered 2023-07-25: 1 g via INTRAVENOUS
  Filled 2023-07-25: qty 10

## 2023-07-25 MED ORDER — VITAMIN C 500 MG PO TABS
500.0000 mg | ORAL_TABLET | Freq: Every morning | ORAL | Status: DC
  Administered 2023-07-26 – 2023-07-27 (×2): 500 mg via ORAL
  Filled 2023-07-25 (×2): qty 1

## 2023-07-25 NOTE — ED Notes (Signed)
 Wound on R foot cleansed with normal saline per order, ointment applied, telfa bandage applied and foot bandaged with Keflex

## 2023-07-25 NOTE — Progress Notes (Signed)
 Tele called, pt had a 5 beat run of vtach. MD notified.

## 2023-07-25 NOTE — Consult Note (Signed)
 WOC Nurse Consult Note: patient underwent ankle debridement with graft placement by Dr. Rosemarie Conquest 03/2023; last seen in his office 07/09/2023 with orders for Mupirocin ointment to wound bed every other day and dry dressing  Reason for Consult: R lateral ankle wound  Wound type: full thickness post debridement and graft placement as above  Pressure Injury POA: NA, not pressure  Measurement: see nursing flowsheet  Wound bed: pink dry, some brown dry tissue  Drainage (amount, consistency, odor) appears dry  Periwound: intact  Dressing procedure/placement/frequency:  Cleanse L ankle wound with NS, apply Mupirocin ointment to wound bed every other day, cover with Telfa nonstick dressing and silicone foam or Kerlix roll gauze anchored around foot whichever patient prefers.   POC discussed with bedside nurse. WOC team will not follow. Re-consult if further needs arise.   Thank you,    Ronni Colace MSN, RN-BC, [employer] [phone]

## 2023-07-25 NOTE — Plan of Care (Signed)

## 2023-07-25 NOTE — ED Provider Notes (Signed)
  Physical Exam  BP (!) 147/71   Pulse 67   Temp 97.7 F (36.5 C) (Oral)   Resp (!) 28   Ht 5\' 2"  (1.575 m)   Wt 36.3 kg   SpO2 90%   BMI 14.63 kg/m   Physical Exam Vitals and nursing note reviewed.  Constitutional:      Appearance: She is well-developed.  HENT:     Head: Normocephalic and atraumatic.  Pulmonary:     Effort: Pulmonary effort is normal.  Skin:    General: Skin is warm and dry.  Neurological:     Mental Status: She is alert and oriented to person, place, and time.     Procedures  Procedures  ED Course / MDM    Medical Decision Making Amount and/or Complexity of Data Reviewed Labs: ordered. Radiology: ordered.  Risk Prescription drug management.   Care assumed from Dr. Aldean Amass at shift change.  Patient awaiting for results of laboratory studies.  Patient with history of congestive heart failure presenting with complaints of shortness of breath.  This started yesterday.  Her laboratory studies have returned showing a BNP of nearly 4000.  Chest x-ray shows an acute on chronic right upper lobe infiltrate.  I have ordered Lasix 20 mg IV along with Rocephin and Zithromax and have consulted the hospitalist for admission.  Patient came in initially with O2 sats of 83% and is saturating in the mid 90s on 2 L nasal cannula.  She is not on home oxygen and has a new oxygen requirement.       Orvilla Blander, MD 07/25/23 [phone]

## 2023-07-25 NOTE — ED Notes (Signed)
 Pt repositioned in bed.

## 2023-07-25 NOTE — Hospital Course (Addendum)
 83 year old female with a history of persistent atrial fibrillation, COPD/bronchiectasis, hypertension, hyperlipidemia, peripheral arterial disease, HFrEF, breast cancer, and essential tremor presenting with at least 2-day history of shortness of breath and orthopnea type symptoms.  The patient denies any fevers, chills, headache, neck pain, nausea, vomiting, diarrhea, abdominal pain.  There is no dysuria or hematuria.  She stated that she weighed about 85 pounds at home on 07/24/2023.  She is uncertain if she is gaining any weight or not.  She is not able to tell me whether her leg edema has been any worse than usual but thinks that is about the same.  She has an occasional nonproductive cough without any hemoptysis. Notably, the patient recently had a balloon angioplasty of her left SFA on 07/16/2023, Dr. Charlotte Cookey.   In addition, the patient had been hospitalized from 03/13/2023 to 03/20/2023 secondary to sepsis from infected right ankle wound and cellulitis.  The patient was treated with 2 weeks of IV vancomycin and approximately 4 weeks of linezolid.  She has been off antibiotics since the end of January 2025.  In the ED, the patient was afebrile hemodynamically stable.  Oxygen saturation was 83% on room air.  WBC 12.1, hemoglobin 9.5, platelet 341.  BMP showed sodium 140, potassium 4.6, bicarbonate 22, serum creatinine 1.42.  AST 35, ALT 42, alk phosphatase 139, total bilirubin 0.8.  BNP 3972.  Troponin 18>> 17.  EKG shows sinus rhythm with nonspecific T wave changes.  Chest x-ray showed increased right lower lobe opacity.  The patient was started on ceftriaxone and azithromycin.  Furosemide 20 mg IV was given.  The patient was admitted for further evaluation and treatment.

## 2023-07-25 NOTE — Progress Notes (Signed)
   07/25/23 1559  TOC Brief Assessment  Insurance and Status Reviewed  Patient has primary care physician Yes  Home environment has been reviewed From home c/husband  Prior level of function: Independent  Prior/Current Home Services Current home services [facility])  Social Drivers of Health Review SDOH reviewed no interventions necessary  Readmission risk has been reviewed Yes  Transition of care needs transition of care needs identified, TOC will continue to follow   Pt admitted c/pneumonia and acute exacerbation of CHF. Currently on O2 at 2 lpm. Pt active c/[facility] RN for wound care.

## 2023-07-25 NOTE — Plan of Care (Signed)
   Problem: Education: Goal: Knowledge of General Education information will improve Description Including pain rating scale, medication(s)/side effects and non-pharmacologic comfort measures Outcome: Progressing   Problem: Health Behavior/Discharge Planning: Goal: Ability to manage health-related needs will improve Outcome: Progressing

## 2023-07-25 NOTE — H&P (Signed)
 History and Physical    Patient: [name]:[id number] DOB: 04/20/1940 DOA: 07/24/2023 DOS: the patient was seen and examined on 07/25/2023 PCP: Benita Stabile, MD  Patient coming from: Home  Chief Complaint:  Chief Complaint  Patient presents with   Shortness of Breath   HPI: [name] is a 83 y.o. 83 year old female with a history of persistent atrial fibrillation, COPD/bronchiectasis, hypertension, hyperlipidemia, peripheral arterial disease, HFrEF, breast cancer, and essential tremor presenting with at least 2-day history of shortness of breath and orthopnea type symptoms.  The patient denies any fevers, chills, headache, neck pain, nausea, vomiting, diarrhea, abdominal pain.  There is no dysuria or hematuria.  She stated that she weighed about 85 pounds at home on 07/24/2023.  She is uncertain if she is gaining any weight or not.  She is not able to tell me whether her leg edema has been any worse than usual but thinks that is about the same.  She has an occasional nonproductive cough without any hemoptysis. Notably, the patient recently had a balloon angioplasty of her left SFA on 07/16/2023, Dr. Myra Gianotti.   In addition, the patient had been hospitalized from 03/13/2023 to 03/20/2023 secondary to sepsis from infected right ankle wound and cellulitis.  The patient was treated with 2 weeks of IV vancomycin and approximately 4 weeks of linezolid.  She has been off antibiotics since the end of January 2025.  In the ED, the patient was afebrile hemodynamically stable.  Oxygen saturation was 83% on room air.  WBC 12.1, hemoglobin 9.5, platelet 341.  BMP showed sodium 140, potassium 4.6, bicarbonate 22, serum creatinine 1.42.  AST 35, ALT 42, alk phosphatase 139, total bilirubin 0.8.  BNP 3972.  Troponin 18>> 17.  EKG shows sinus rhythm with nonspecific T wave changes.  Chest x-ray showed increased right lower lobe opacity.  The patient was started on ceftriaxone and azithromycin.  Furosemide 20 mg  IV was given.  The patient was admitted for further evaluation and treatment.  Review of Systems: As mentioned in the history of present illness. All other systems reviewed and are negative. Past Medical History:  Diagnosis Date   Anemia 05/2014   Atrial fibrillation (HCC)    x 3 yrs   Breast cancer (HCC)    S/P left mastectomy and chemotherapy 1989 remained in remission   Carotid artery occlusion    Carotid bruit    LICA 60-79% (duplex 9/11)   Degenerative arthritis of spine 2015   Chronic back pain   Hyperlipidemia    Hypertension    Meningioma (HCC)    Osteoporosis    Peripheral arterial disease (HCC)    Pneumonia 05/19/2014   Stroke (HCC)    CVA 2008   Subclavian steal syndrome    Ulcers of both lower extremities (HCC) 2015   Past Surgical History:  Procedure Laterality Date   ABDOMINAL AORTOGRAM W/LOWER EXTREMITY N/A 12/19/2021   Procedure: ABDOMINAL AORTOGRAM W/LOWER EXTREMITY;  Surgeon: Nada Libman, MD;  Location: [facility];  Service: Cardiovascular;  Laterality: N/A;   ABDOMINAL AORTOGRAM W/LOWER EXTREMITY Bilateral 08/28/2022   Procedure: ABDOMINAL AORTOGRAM W/LOWER EXTREMITY;  Surgeon: Nada Libman, MD;  Location: [facility];  Service: Cardiovascular;  Laterality: Bilateral;   ABDOMINAL AORTOGRAM W/LOWER EXTREMITY N/A 10/16/2022   Procedure: ABDOMINAL AORTOGRAM W/LOWER EXTREMITY;  Surgeon: Nada Libman, MD;  Location: [facility];  Service: Cardiovascular;  Laterality: N/A;   ABDOMINAL AORTOGRAM W/LOWER EXTREMITY N/A 02/26/2023  Procedure: ABDOMINAL AORTOGRAM W/LOWER EXTREMITY;  Surgeon: Margherita Shell, MD;  Location: [facility];  Service: Cardiovascular;  Laterality: N/A;   ABDOMINAL AORTOGRAM W/LOWER EXTREMITY N/A 07/16/2023   Procedure: ABDOMINAL AORTOGRAM W/LOWER EXTREMITY;  Surgeon: Margherita Shell, MD;  Location: [facility];  Service: Cardiovascular;  Laterality: N/A;   AMPUTATION TOE Left 12/29/2021   Procedure:  Incisional DEBRIDEMENT Left Foot with Amputation Left Great Toe;  Surgeon: Velma Ghazi, DPM;  Location: [facility];  Service: Podiatry;  Laterality: Left;   CARDIAC CATHETERIZATION     CATARACT EXTRACTION Bilateral 2013   COLONOSCOPY  11/17/2010   Procedure: COLONOSCOPY;  Surgeon: Ruby Corporal, MD;  Location: [facility];  Service: Endoscopy;  Laterality: N/A;  10:45 am   ESOPHAGOGASTRODUODENOSCOPY  11/17/2010   Procedure: ESOPHAGOGASTRODUODENOSCOPY (EGD);  Surgeon: Ruby Corporal, MD;  Location: [facility];  Service: Endoscopy;  Laterality: N/A;   ESOPHAGOGASTRODUODENOSCOPY N/A 02/16/2015   Procedure: ESOPHAGOGASTRODUODENOSCOPY (EGD);  Surgeon: Danette Duos, MD;  [facility];  Service: Gastroenterology;  Laterality: N/A;   EXPLORATORY LAPAROTOMY  1960s   For peritonitis of undetermined cause   EYE SURGERY     INCISION AND DRAINAGE OF WOUND Right 03/15/2023   Procedure: IRRIGATION AND DEBRIDEMENT WOUND;  Surgeon: Evertt Hoe, DPM;  Location: [facility];  Service: Orthopedics/Podiatry;  Laterality: Right;  Right ankle ulcer debridement, bone biopsy, graft application   LOWER EXTREMITY ANGIOGRAPHY N/A 07/16/2023   Procedure: Lower Extremity Angiography;  Surgeon: Margherita Shell, MD;  [facility]: [facility];  Service: Cardiovascular;  Laterality: N/A;   LOWER EXTREMITY INTERVENTION Left 07/16/2023   Procedure: LOWER EXTREMITY INTERVENTION;  Surgeon: Margherita Shell, MD;  [facility]: [facility];  Service: Cardiovascular;  Laterality: Left;  SFA laser   LUMBAR EPIDURAL INJECTION  06-2012--06-2013   pt. states she has had 5 epidurals in 06-2012----06-2013   MASTECTOMY Left 1990   PERIPHERAL VASCULAR ATHERECTOMY  08/28/2022   Procedure: PERIPHERAL VASCULAR ATHERECTOMY;  Surgeon: Margherita Shell, MD;  [facility]: [facility];  Service: Cardiovascular;;  Lt Popliteal and AT   PERIPHERAL VASCULAR ATHERECTOMY  02/26/2023   Procedure: PERIPHERAL VASCULAR  ATHERECTOMY;  Surgeon: Margherita Shell, MD;  [facility];  Service: Cardiovascular;;   PERIPHERAL VASCULAR BALLOON ANGIOPLASTY  08/28/2022   Procedure: PERIPHERAL VASCULAR BALLOON ANGIOPLASTY;  Surgeon: Margherita Shell, MD;  [facility];  Service: Cardiovascular;;  Lt Popliteal and AT   PERIPHERAL VASCULAR BALLOON ANGIOPLASTY  10/16/2022   Procedure: PERIPHERAL VASCULAR BALLOON ANGIOPLASTY;  Surgeon: Margherita Shell, MD;  [facility];  Service: Cardiovascular;;  Rt AT   PERIPHERAL VASCULAR BALLOON ANGIOPLASTY  02/26/2023   Procedure: PERIPHERAL VASCULAR BALLOON ANGIOPLASTY;  Surgeon: Margherita Shell, MD;  [facility];  Service: Cardiovascular;;   PERIPHERAL VASCULAR INTERVENTION Left 12/19/2021   Procedure: PERIPHERAL VASCULAR INTERVENTION;  Surgeon: Margherita Shell, MD;  [facility];  Service: Cardiovascular;  Laterality: Left;  SFA and PTA of POP   YAG LASER APPLICATION Right 05/03/2015   Procedure: YAG LASER APPLICATION;  Surgeon: Albert Huff, MD;  Location: [facility];  Service: Ophthalmology;  Laterality: Right;  right   YAG LASER APPLICATION Left 05/24/2015   Procedure: YAG LASER APPLICATION;  Surgeon: Albert Huff, MD;  Location: [facility];  Service: Ophthalmology;  Laterality: Left;   Social History:  reports that she quit smoking about 16  years ago. Her smoking use included cigarettes. She started smoking about 72 years ago. She has a 56 pack-year smoking history. She has never been exposed to tobacco smoke. She has never used smokeless tobacco. She reports that she does not drink alcohol and does not use drugs.  Allergies  Allergen Reactions   Iodine Rash and Other (See Comments)    BETADINE, blistering rash/burns   Iohexol Rash and Other (See Comments)    Blisters; PT NEEDS 13-HOUR PREP    Petroleum Gauze Non-Woven 3x9" [Wound Dressings] Other (See Comments)    BURNING SENSATION, RED SKIN   Sulfa  Antibiotics Nausea And Vomiting   Wound Dressing Adhesive Other (See Comments)    Burns skin   Tape Itching, Rash and Other (See Comments)    "DO NOT USE ADHESIVE TAPE" Not even a Band Aid" NO PAPER TAPE Burns skin Skin is very very thin    Family History  Problem Relation Age of Onset   Alzheimer's disease Mother    Dementia Mother    Hypertension Mother    Hyperlipidemia Mother    Parkinsonism Father     Prior to Admission medications   Medication Sig Start Date End Date Taking? Authorizing Provider  acetaminophen (TYLENOL) 325 MG tablet Take 2 tablets (650 mg total) by mouth every 6 (six) hours as needed for mild pain (pain score 1-3) (or Fever >/= 101). 03/20/23   Azucena Fallen, MD  albuterol (VENTOLIN HFA) 108 (90 Base) MCG/ACT inhaler Inhale 2 puffs into the lungs every 6 (six) hours as needed for wheezing or shortness of breath. 11/28/22   Kalman Shan, MD  AMBULATORY NON FORMULARY MEDICATION Dispense one Readi Steadi glove 09/25/22   Juanita Streight, Octaviano Batty, DO  amiodarone (PACERONE) 200 MG tablet Take 0.5 tablets (100 mg total) by mouth every other day. Patient taking differently: Take 200 mg by mouth daily. 10/22/22   Wendall Stade, MD  apixaban (ELIQUIS) 2.5 MG TABS tablet Take 1 tablet (2.5 mg total) by mouth 2 (two) times daily. 07/23/22   Wendall Stade, MD  ascorbic acid (VITAMIN C) 500 MG tablet Take 500 mg by mouth in the morning.    [provider]  CALCIUM PO Take 1,200 mg by mouth 2 (two) times daily. 600 mg each    [provider]  Camphor-Menthol-Methyl Sal (SALONPAS) 3.04-14-08 % PTCH Place 1 patch onto the skin daily as needed (pain.).    [provider]  clopidogrel (PLAVIX) 75 MG tablet TAKE 1 TABLET EVERY MORNING 10/30/22   Wendall Stade, MD  denosumab (PROLIA) 60 MG/ML SOSY injection Inject 60 mg into the skin every 6 (six) months.    Benita Stabile, MD  Ferrous Sulfate (IRON PO) Take 65 mg by mouth at bedtime.    [provider]  furosemide (LASIX) 20 MG tablet Take 2 tablets (40 mg total) by mouth daily. Patient taking differently: Take 20 mg by mouth 2 (two) times daily. 11/26/22   Swinyer, Zachary George, NP  levothyroxine (SYNTHROID) 50 MCG tablet Take 50 mcg by mouth daily before breakfast. 07/12/23   [provider]  linezolid (ZYVOX) 600 MG tablet Take 1 tablet (600 mg total) by mouth 2 (two) times daily. Patient not taking: Reported on 07/10/2023 03/27/23   Judyann Munson, MD  MAGNESIUM OXIDE PO Take 500 mg by mouth every morning.    [provider]  metoprolol tartrate (LOPRESSOR) 25 MG tablet Take 3 tablets (75 mg total) by mouth 2 (  two) times daily. 11/20/22   Nishan, Peter C, MD  Multiple Vitamin (MULTIVITAMIN WITH MINERALS) TABS tablet Take 1 tablet by mouth every morning.    [provider]  mupirocin ointment (BACTROBAN) 2 % Apply 1 Application topically daily. 04/17/23   Standiford, Karlene Overcast, DPM  nystatin (MYCOSTATIN) 100000 UNIT/ML suspension Take 5 mLs (500,000 Units total) by mouth 4 (four) times daily. Patient not taking: Reported on 07/10/2023 04/17/23   Liane Redman, MD  pantoprazole (PROTONIX) 40 MG tablet Take 1 tablet (40 mg total) by mouth daily. 10/12/22   Nishan, Peter C, MD  potassium chloride (KLOR-CON M) 10 MEQ tablet Take 2 tablets (20 mEq total) by mouth daily. Patient taking differently: Take 10 mEq by mouth daily. 11/26/22   Swinyer, Leilani Punter, NP  pravastatin (PRAVACHOL) 40 MG tablet TAKE 1 TABLET DAILY Patient taking differently: Take 40 mg by mouth every evening. 08/27/22   Nishan, Peter C, MD  SANTYL 250 UNIT/GM ointment APPLY TO AFFECTED AREAS ONCE DAILY. Patient not taking: Reported on 07/10/2023 03/30/22   Velma Ghazi, DPM  silver sulfADIAZINE (SILVADENE) 1 % cream Apply 1 Application topically daily. Patient not taking: Reported on 07/10/2023 04/16/23   Standiford, Karlene Overcast, DPM  umeclidinium bromide (INCRUSE ELLIPTA) 62.5 MCG/ACT AEPB Inhale 1  puff into the lungs daily. 06/25/22   Maire Scot, MD    Physical Exam: Vitals:   07/25/23 0030 07/25/23 0100 07/25/23 0200 07/25/23 0500  BP: (!) 147/71 (!) 154/59 123/76 (!) 164/69  Pulse: 67 62 81 (!) 52  Resp: (!) 28 18 (!) 23 17  Temp:  97.9 F (36.6 C)  97.9 F (36.6 C)  TempSrc:  Oral  Oral  SpO2: 90% 94% 98% 97%  Weight:      Height:       GENERAL:  A&O x 3, NAD, well developed, cooperative, follows commands HEENT: [place]/AT, No thrush, No icterus, No oral ulcers Neck:  No neck mass, No meningismus, soft, supple CV: RRR, no S3, no S4, no rub, no JVD Lungs:  bilateral crackles. No wheeze Abd: soft/NT +BS, nondistended Ext: 1 + LE edema, no lymphangitis, no cyanosis, no rashes Neuro:  CN II-XII intact, strength 4/5 in RUE, RLE, strength 4/5 LUE, LLE; sensation intact bilateral; no dysmetria; babinski equivocal  Data Reviewed: Data reviewed above in hsitory Assessment and Plan: Acute respiratory failure with hypoxia -Secondary to pneumonia and pulmonary edema - Stable on 2 L - Presented with saturation in the 80s and tachypnea - Obtain CT chest  Lobar pneumonia - Check PCT - MRSA screen - Continue ceftriaxone and azithromycin  Acute on chronic HFrEF -05/24/2022 echo EF 40 to 45%, global HK, normal RVEF, PASP 39.7, mild to moderate MR  - Continue IV furosemide - Accurate I's and O's - Presented with BNP 3972  Persistent atrial fibrillation - Continue metoprolol and amiodarone - Continue apixaban -Patient was not felt to be a good candidate for antiarrhythmic therapy secondary to her multiple comorbidities   COPD/bronchiectasis -Quit smoking 30 years ago -Not on supplemental oxygen at home -Start bronchodilators  Essential hypertension -Continue metoprolol   PAD (peripheral artery disease) (HCC) --01/15/2022 ABIs--normal bilateral -Continue follow-up with [facility] -Wound care service consultation for recommendations --01/15/2022 ABIs--normal bilateral    CKD stage IIIb -Baseline creatinine 1.2-1.5 -Monitor with diuresis   Hyperlipidemia -Continue statin   Essential tremor -Outpatient follow-up with neurology -Hold mysoline with which pt agrees due to interaction with apixaban -Outpatient follow-up with Dr. Winferd Hatter   History of breast  cancer -Status post bilateral mastectomy, chemo and radiation -Appears to be currently in remission -Continue outpatient follow-up screening.   Severe osteopenia/osteoporosis -Continue outpatient treatment with Prolia after discharge.  Underweight -BMI 14.63   Advance Care Planning: FULL  Consults: none  Family Communication: non present  Severity of Illness: The appropriate patient status for this patient is INPATIENT. Inpatient status is judged to be reasonable and necessary in order to provide the required intensity of service to ensure the patient's safety. The patient's presenting symptoms, physical exam findings, and initial radiographic and laboratory data in the context of their chronic comorbidities is felt to place them at high risk for further clinical deterioration. Furthermore, it is not anticipated that the patient will be medically stable for discharge from the hospital within 2 midnights of admission.   * I certify that at the point of admission it is my clinical judgment that the patient will require inpatient hospital care spanning beyond 2 midnights from the point of admission due to high intensity of service, high risk for further deterioration and high frequency of surveillance required.*  Author: Demaris Fillers, MD 07/25/2023 7:52 AM  For on call review [email or web address].

## 2023-07-26 ENCOUNTER — Inpatient Hospital Stay (HOSPITAL_COMMUNITY)

## 2023-07-26 DIAGNOSIS — I5033 Acute on chronic diastolic (congestive) heart failure: Secondary | ICD-10-CM | POA: Diagnosis not present

## 2023-07-26 DIAGNOSIS — I4819 Other persistent atrial fibrillation: Secondary | ICD-10-CM | POA: Diagnosis not present

## 2023-07-26 DIAGNOSIS — J181 Lobar pneumonia, unspecified organism: Secondary | ICD-10-CM | POA: Diagnosis not present

## 2023-07-26 DIAGNOSIS — I5023 Acute on chronic systolic (congestive) heart failure: Secondary | ICD-10-CM | POA: Diagnosis not present

## 2023-07-26 LAB — BASIC METABOLIC PANEL WITH GFR
Anion gap: 8 (ref 5–15)
BUN: 36 mg/dL — ABNORMAL HIGH (ref 8–23)
CO2: 22 mmol/L (ref 22–32)
Calcium: 7.8 mg/dL — ABNORMAL LOW (ref 8.9–10.3)
Chloride: 103 mmol/L (ref 98–111)
Creatinine, Ser: 1.37 mg/dL — ABNORMAL HIGH (ref 0.44–1.00)
GFR, Estimated: 38 mL/min — ABNORMAL LOW (ref >60)
Glucose, Bld: 108 mg/dL — ABNORMAL HIGH (ref 70–99)
Potassium: 3.9 mmol/L (ref 3.5–5.1)
Sodium: 133 mmol/L — ABNORMAL LOW (ref 135–145)

## 2023-07-26 LAB — CBC
HCT: 27.3 % — ABNORMAL LOW (ref 36.0–46.0)
Hemoglobin: 8.7 g/dL — ABNORMAL LOW (ref 12.0–15.0)
MCH: 32.8 pg (ref 26.0–34.0)
MCHC: 31.9 g/dL (ref 30.0–36.0)
MCV: 103 fL — ABNORMAL HIGH (ref 80.0–100.0)
Platelets: 324 10*3/uL (ref 150–400)
RBC: 2.65 MIL/uL — ABNORMAL LOW (ref 3.87–5.11)
RDW: 15.8 % — ABNORMAL HIGH (ref 11.5–15.5)
WBC: 13.7 10*3/uL — ABNORMAL HIGH (ref 4.0–10.5)
nRBC: 0.1 % (ref 0.0–0.2)

## 2023-07-26 LAB — ECHOCARDIOGRAM COMPLETE
AR max vel: 1.95 cm2
AV Area VTI: 1.76 cm2
AV Area mean vel: 1.54 cm2
AV Mean grad: 2 mmHg
AV Peak grad: 3.2 mmHg
Ao pk vel: 0.89 m/s
Area-P 1/2: 5.62 cm2
Calc EF: 50.7 %
Est EF: 45
Height: 62 in
MV VTI: 1.75 cm2
S' Lateral: 3.1 cm
Single Plane A2C EF: 45.2 %
Single Plane A4C EF: 58.2 %
Weight: 1481.49 [oz_av]

## 2023-07-26 LAB — MAGNESIUM: Magnesium: 2.2 mg/dL (ref 1.7–2.4)

## 2023-07-26 LAB — TSH: TSH: 15.43 u[IU]/mL — ABNORMAL HIGH (ref 0.350–4.500)

## 2023-07-26 LAB — MRSA NEXT GEN BY PCR, NASAL: MRSA by PCR Next Gen: NOT DETECTED

## 2023-07-26 MED ORDER — FUROSEMIDE 10 MG/ML IJ SOLN
40.0000 mg | Freq: Once | INTRAMUSCULAR | Status: AC
  Administered 2023-07-26: 40 mg via INTRAVENOUS
  Filled 2023-07-26: qty 4

## 2023-07-26 MED ORDER — SENNA 8.6 MG PO TABS
2.0000 | ORAL_TABLET | Freq: Every day | ORAL | Status: DC
  Administered 2023-07-26 – 2023-07-27 (×2): 17.2 mg via ORAL
  Filled 2023-07-26 (×2): qty 2

## 2023-07-26 MED ORDER — POLYETHYLENE GLYCOL 3350 17 G PO PACK
17.0000 g | PACK | Freq: Every day | ORAL | Status: DC
Start: 2023-07-26 — End: 2023-07-27
  Administered 2023-07-26: 17 g via ORAL
  Filled 2023-07-26 (×2): qty 1

## 2023-07-26 NOTE — Progress Notes (Addendum)
 PROGRESS NOTE  [name] FMW:[id number] DOB: 10/09/1940 DOA: 07/24/2023 PCP: [name], MD  Brief History:  83 year old female with a history of persistent atrial fibrillation, COPD/bronchiectasis, hypertension, hyperlipidemia, peripheral arterial disease, HFrEF, breast cancer, and essential tremor presenting with at least 2-day history of shortness of breath and orthopnea type symptoms.  The patient denies any fevers, chills, headache, neck pain, nausea, vomiting, diarrhea, abdominal pain.  There is no dysuria or hematuria.  She stated that she weighed about 85 pounds at home on 07/24/2023.  She is uncertain if she is gaining any weight or not.  She is not able to tell me whether her leg edema has been any worse than usual but thinks that is about the same.  She has an occasional nonproductive cough without any hemoptysis. Notably, the patient recently had a balloon angioplasty of her left SFA on 07/16/2023, Dr. Serene.   In addition, the patient had been hospitalized from 03/13/2023 to 03/20/2023 secondary to sepsis from infected right ankle wound and cellulitis.  The patient was treated with 2 weeks of IV vancomycin  and approximately 4 weeks of linezolid .  She has been off antibiotics since the end of January 2025.  In the ED, the patient was afebrile hemodynamically stable.  Oxygen saturation was 83% on room air.  WBC 12.1, hemoglobin 9.5, platelet 341.  BMP showed sodium 140, potassium 4.6, bicarbonate 22, serum creatinine 1.42.  AST 35, ALT 42, alk phosphatase 139, total bilirubin 0.8.  BNP 3972.  Troponin 18>> 17.  EKG shows sinus rhythm with nonspecific T wave changes.  Chest x-ray showed increased right lower lobe opacity.  The patient was started on ceftriaxone  and azithromycin .  Furosemide  20 mg IV was given.  The patient was admitted for further evaluation and treatment.   Assessment/Plan: Acute respiratory failure with hypoxia -Secondary to pneumonia and pulmonary edema -  Stable on 2 L - Presented with saturation in the 80s and tachypnea - 4/17 CT chest--RML alveolar opacity. Interval increase size of LLL cavitary nodule up tl 2.0 x 1.7 cm; slight increase mediastinal LN - Continue ceftriaxone  and azithromycin    Lobar pneumonia - Check PCT 0.10 - MRSA screen - 4/17 CT chest--RML alveolar opacity. Interval increase size of LLL cavitary nodule up tl 2.0 x 1.7 cm; slight increase mediastinal LN - Continue ceftriaxone  and azithromycin  -pt is aware of cavitary nodule increased size--does not want any further work up   Acute on chronic HFrEF -05/24/2022 echo EF 40 to 45%, global HK, normal RVEF, PASP 39.7, mild to moderate MR  - Continue IV furosemide  - Accurate I's and O's - Presented with BNP 3972 - repeat Echo   Persistent atrial fibrillation - Continue metoprolol  and amiodarone  - Continue apixaban  -Patient was not felt to be a good candidate for antiarrhythmic therapy secondary to her multiple comorbidities    COPD/bronchiectasis -Quit smoking 30 years ago -Not on supplemental oxygen at home -Started bronchodilators   Essential hypertension -Continue metoprolol    PAD (peripheral artery disease) (HCC) -Continue follow-up with [facility] -Wound care service consultation for recommendations -May 2024 had laser atherectomy of right anterior tibial for non healing right ulcer ABI 0.87  -07/16/23 balloon angioplasty of her left SFA    CKD stage IIIb -Baseline creatinine 1.2-1.5 -Monitor with diuresis   Hyperlipidemia -Continue statin   Essential tremor -Outpatient follow-up with neurology -Hold mysoline  with which pt agrees due to interaction with apixaban  -Outpatient follow-up with Dr. Evonnie  History of breast cancer -Status post bilateral mastectomy, chemo and radiation -Appears to be currently in remission -Continue outpatient follow-up screening.   Severe osteopenia/osteoporosis -Continue outpatient treatment with Prolia  after discharge.    Underweight -BMI 14.63  Goals of Care -pt confirms DNR            Family Communication:   spouse updated 4/18  Consultants:  none  Code Status:  DNR  DVT Prophylaxis:  apixaban    Procedures: As Listed in Progress Note Above  Antibiotics: Ceftriaxone  4/17>> Azithro 4/17>>     Subjective: Pt states breathing is improving.  Denies f/c, cp, n/v/d, abd pain  Objective: Vitals:   07/25/23 2149 07/26/23 0450 07/26/23 0751 07/26/23 0759  BP: (!) 153/66 (!) 140/98  (!) 145/87  Pulse: (!) 57   (!) 59  Resp: (!) 22 18    Temp: 98 F (36.7 C) 98.2 F (36.8 C)    TempSrc: Oral Oral    SpO2: 96% 98% 99%   Weight:      Height:        Intake/Output Summary (Last 24 hours) at 07/26/2023 1124 Last data filed at 07/26/2023 0941 Gross per 24 hour  Intake 720 ml  Output 250 ml  Net 470 ml   Weight change: 5.712 kg Exam:  General:  Pt is alert, follows commands appropriately, not in acute distress HEENT: No icterus, No thrush, No neck mass, [place]/AT Cardiovascular: IRRR, S1/S2, no rubs, no gallops Respiratory: bibasilar rales, R>L Abdomen: Soft/+BS, non tender, non distended, no guarding Extremities: trace LE edema, No lymphangitis, No petechiae, No rashes, no synovitis   Data Reviewed: I have personally reviewed following labs and imaging studies Basic Metabolic Panel: Recent Labs  Lab 07/24/23 2323 07/26/23 0516  NA 140 133*  K 4.6 3.9  CL 103 103  CO2 22 22  GLUCOSE 107* 108*  BUN 30* 36*  CREATININE 1.42* 1.37*  CALCIUM  9.0 7.8*  MG  --  2.2   Liver Function Tests: Recent Labs  Lab 07/24/23 2323  AST 35  ALT 42  ALKPHOS 139*  BILITOT 0.8  PROT 6.8  ALBUMIN  3.0*   No results for input(s): LIPASE, AMYLASE in the last 168 hours. No results for input(s): AMMONIA in the last 168 hours. Coagulation Profile: No results for input(s): INR, PROTIME in the last 168 hours. CBC: Recent Labs  Lab 07/24/23 2323 07/26/23 0516  WBC 12.1*  13.7*  NEUTROABS 9.8*  --   HGB 9.5* 8.7*  HCT 30.6* 27.3*  MCV 105.5* 103.0*  PLT 341 324   Cardiac Enzymes: No results for input(s): CKTOTAL, CKMB, CKMBINDEX, TROPONINI in the last 168 hours. BNP: Invalid input(s): POCBNP CBG: No results for input(s): GLUCAP in the last 168 hours. HbA1C: No results for input(s): HGBA1C in the last 72 hours. Urine analysis:    Component Value Date/Time   COLORURINE YELLOW 03/13/2023 1511   APPEARANCEUR CLEAR 03/13/2023 1511   LABSPEC 1.012 03/13/2023 1511   PHURINE 5.0 03/13/2023 1511   GLUCOSEU NEGATIVE 03/13/2023 1511   HGBUR NEGATIVE 03/13/2023 1511   BILIRUBINUR NEGATIVE 03/13/2023 1511   KETONESUR NEGATIVE 03/13/2023 1511   PROTEINUR 30 (A) 03/13/2023 1511   UROBILINOGEN 0.2 02/15/2015 0417   NITRITE NEGATIVE 03/13/2023 1511   LEUKOCYTESUR NEGATIVE 03/13/2023 1511   Sepsis Labs: @LABRCNTIP (procalcitonin:4,lacticidven:4) ) Recent Results (from the past 240 hours)  Resp panel by RT-PCR (RSV, Flu A&B, Covid) Anterior Nasal Swab     Status: None   Collection Time: 07/25/23  8:11  AM   Specimen: Anterior Nasal Swab  Result Value Ref Range Status   SARS Coronavirus 2 by RT PCR NEGATIVE NEGATIVE Final    Comment: (NOTE) SARS-CoV-2 target nucleic acids are NOT DETECTED.  The SARS-CoV-2 RNA is generally detectable in upper respiratory specimens during the acute phase of infection. The lowest concentration of SARS-CoV-2 viral copies this assay can detect is 138 copies/mL. A negative result does not preclude SARS-Cov-2 infection and should not be used as the sole basis for treatment or other patient management decisions. A negative result may occur with  improper specimen collection/handling, submission of specimen other than nasopharyngeal swab, presence of viral mutation(s) within the areas targeted by this assay, and inadequate number of viral copies(<138 copies/mL). A negative result must be combined with clinical  observations, patient history, and epidemiological information. The expected result is Negative.  Fact Sheet for Patients:  bloggercourse.com  Fact Sheet for Healthcare Providers:  seriousbroker.it  This test is no t yet approved or cleared by the [place]  FDA and  has been authorized for detection and/or diagnosis of SARS-CoV-2 by FDA under an Emergency Use Authorization (EUA). This EUA will remain  in effect (meaning this test can be used) for the duration of the COVID-19 declaration under Section 564(b)(1) of the Act, 21 U.S.C.section 360bbb-3(b)(1), unless the authorization is terminated  or revoked sooner.       Influenza A by PCR NEGATIVE NEGATIVE Final   Influenza B by PCR NEGATIVE NEGATIVE Final    Comment: (NOTE) The Xpert Xpress SARS-CoV-2/FLU/RSV plus assay is intended as an aid in the diagnosis of influenza from Nasopharyngeal swab specimens and should not be used as a sole basis for treatment. Nasal washings and aspirates are unacceptable for Xpert Xpress SARS-CoV-2/FLU/RSV testing.  Fact Sheet for Patients: bloggercourse.com  Fact Sheet for Healthcare Providers: seriousbroker.it  This test is not yet approved or cleared by the [place]  FDA and has been authorized for detection and/or diagnosis of SARS-CoV-2 by FDA under an Emergency Use Authorization (EUA). This EUA will remain in effect (meaning this test can be used) for the duration of the COVID-19 declaration under Section 564(b)(1) of the Act, 21 U.S.C. section 360bbb-3(b)(1), unless the authorization is terminated or revoked.     Resp Syncytial Virus by PCR NEGATIVE NEGATIVE Final    Comment: (NOTE) Fact Sheet for Patients: bloggercourse.com  Fact Sheet for Healthcare Providers: seriousbroker.it  This test is not yet approved or cleared by  the [place]  FDA and has been authorized for detection and/or diagnosis of SARS-CoV-2 by FDA under an Emergency Use Authorization (EUA). This EUA will remain in effect (meaning this test can be used) for the duration of the COVID-19 declaration under Section 564(b)(1) of the Act, 21 U.S.C. section 360bbb-3(b)(1), unless the authorization is terminated or revoked.  Performed at [facility], [address], [place], [place] [address]   Respiratory (~20 pathogens) panel by PCR     Status: None   Collection Time: 07/25/23  8:11 AM   Specimen: Nasopharyngeal Swab; Respiratory  Result Value Ref Range Status   Adenovirus NOT DETECTED NOT DETECTED Final   Coronavirus 229E NOT DETECTED NOT DETECTED Final    Comment: (NOTE) The Coronavirus on the Respiratory Panel, DOES NOT test for the novel  Coronavirus (2019 nCoV)    Coronavirus HKU1 NOT DETECTED NOT DETECTED Final   Coronavirus NL63 NOT DETECTED NOT DETECTED Final   Coronavirus OC43 NOT DETECTED NOT DETECTED Final   Metapneumovirus NOT DETECTED NOT DETECTED  Final   Rhinovirus / Enterovirus NOT DETECTED NOT DETECTED Final   Influenza A NOT DETECTED NOT DETECTED Final   Influenza B NOT DETECTED NOT DETECTED Final   Parainfluenza Virus 1 NOT DETECTED NOT DETECTED Final   Parainfluenza Virus 2 NOT DETECTED NOT DETECTED Final   Parainfluenza Virus 3 NOT DETECTED NOT DETECTED Final   Parainfluenza Virus 4 NOT DETECTED NOT DETECTED Final   Respiratory Syncytial Virus NOT DETECTED NOT DETECTED Final   Bordetella pertussis NOT DETECTED NOT DETECTED Final   Bordetella Parapertussis NOT DETECTED NOT DETECTED Final   Chlamydophila pneumoniae NOT DETECTED NOT DETECTED Final   Mycoplasma pneumoniae NOT DETECTED NOT DETECTED Final    Comment: Performed at [facility], [address] [place], [place] [address]     Scheduled Meds:  amiodarone   200 mg Oral Daily   apixaban   2.5 mg Oral BID   ascorbic acid   500 mg Oral q AM    azithromycin   500 mg Oral Q2200   clopidogrel   75 mg Oral q morning   furosemide   40 mg Intravenous Once   ipratropium-albuterol   3 mL Nebulization TID   metoprolol  tartrate  75 mg Oral BID   multivitamin with minerals  1 tablet Oral q morning   mupirocin  ointment   Topical QODAY   pantoprazole   40 mg Oral Daily   pravastatin   40 mg Oral QPM   sodium chloride  flush  3 mL Intravenous Q12H   Continuous Infusions:  sodium chloride      cefTRIAXone  (ROCEPHIN )  IV 1 g (07/25/23 2310)    Procedures/Studies: CT CHEST WO CONTRAST Result Date: 07/26/2023 CLINICAL DATA:  Respiratory illness.  Follow-up infiltrates. EXAM: CT CHEST WITHOUT CONTRAST TECHNIQUE: Multidetector CT imaging of the chest was performed following the standard protocol without IV contrast. RADIATION DOSE REDUCTION: This exam was performed according to the departmental dose-optimization program which includes automated exposure control, adjustment of the mA and/or kV according to patient size and/or use of iterative reconstruction technique. COMPARISON:  PET-CT 09/06/2022.  Chest CT 08/16/2022 FINDINGS: Cardiovascular: The heart is enlarged. No substantial pericardial effusion. Coronary artery calcification is evident. Moderate atherosclerotic calcification is noted in the wall of the thoracic aorta. Mediastinum/Nodes: Mediastinal lymphadenopathy again noted. Index precarinal lymph node measured previously at 12 mm short axis is 15 mm short axis today. 16 mm short axis subcarinal lymph node was 13 mm previously. No evidence for gross hilar lymphadenopathy although assessment is limited by the lack of intravenous contrast on the current study. The esophagus has normal imaging features. There is no axillary lymphadenopathy. Lungs/Pleura: Centrilobular and paraseptal emphysema evident. Interstitial an alveolar opacity in the right middle lobe is new in the interval with stable right apical scarring. Similar interstitial and airspace disease  in the right lower lobe is associated with volume loss in the lower lungs bilaterally. Left lower lobe cavitary nodule measures 2.0 x 1.7 cm today on image 103/3, increased from 1.2 x 1.2 cm previously. Small left and tiny right pleural effusions. Upper Abdomen: Large hiatal hernia again noted. Musculoskeletal: No worrisome lytic or sclerotic osseous abnormality. Compression fractures at T10 and T12 are similar to prior. IMPRESSION: 1. Interval increase in size of the left lower lobe cavitary nodule, now measuring 2.0 x 1.7 cm today, increased from 1.2 x 1.2 cm previously. Imaging features remain concerning for neoplasm. 2. Interstitial and alveolar opacity in the right middle lobe is new in the interval. Imaging features are compatible with pneumonia. Close follow-up recommended to  ensure resolution. 3. Similar interstitial and airspace disease in the right lower lobe is associated with volume loss in the lower lungs bilaterally. 4. Small left and tiny right pleural effusions. 5. Mediastinal lymphadenopathy, slightly increased in the interval. Metastatic disease not excluded. 6. Large hiatal hernia. 7.  Emphysema (ICD10-J43.9) and Aortic Atherosclerosis (ICD10-170.0) Electronically Signed   By: Camellia Candle M.D.   On: 07/26/2023 10:06   DG Chest Port 1 View Result Date: 07/24/2023 CLINICAL DATA:  Dyspnea EXAM: PORTABLE CHEST 1 VIEW COMPARISON:  03/20/2023 FINDINGS: Cardiac shadow is mildly prominent but stable. Aortic calcifications are noted. Lungs are well aerated bilaterally. Patchy airspace opacity is noted in the right lung particularly in the right upper lobe along the minor fissure. Underlying chronic interstitial changes are seen. No effusion is noted. No bony abnormality is seen. IMPRESSION: Airspace opacity on the right particularly in the right upper lobe consistent with acute on chronic infiltrate. Electronically Signed   By: Oneil Devonshire M.D.   On: 07/24/2023 23:34   PERIPHERAL VASCULAR  CATHETERIZATION Result Date: 07/16/2023 Images from the original result were not included.   Patient name: [name]    MRN: [id number]        DOB: Jun 02, 1940          Sex: female  07/16/2023 Pre-operative Diagnosis: IN-Stent stenosis Post-operative diagnosis:  Same Surgeon:  Malvina New Procedure Performed:            1.  Ultrasound-guided access, right femoral artery            2.  Abdominal aortogram            3.  Bilateral lower extremity runoff            4.  Small to injection with catheter in left SFA            5.  Laser atherectomy left superficial femoral artery            6.  Drug-coated balloon angioplasty, left superficial femoral artery            7.  Conscious sedation, 52 minutes   Indications: This is an 83 year old female with history of bilateral lower extremity interventions for wounds.  She currently has a right foot wound but has high-grade stenosis in her left leg so she comes in today for further evaluation.  Procedure:  The patient was identified in the holding area and taken to room 8.  The patient was then placed supine on the table and prepped and draped in the usual sterile fashion.  A time out was called.  Conscious sedation was administered with the use of IV fentanyl  and Versed  under continuous physician and nurse monitoring.  Heart rate, blood pressure, and oxygen saturation were continuously monitored.  Total sedation time was 52 minutes.  Ultrasound was used to evaluate the right common femoral artery.  It was patent .  A digital ultrasound image was acquired.  A micropuncture needle was used to access the right common femoral artery under ultrasound guidance.  An 018 wire was advanced without resistance and a micropuncture sheath was placed.  The 018 wire was removed and a benson wire was placed.  The micropuncture sheath was exchanged for a 5 french sheath.  An omniflush catheter was advanced over the wire to the level of L-1.  An abdominal angiogram was obtained.  Next, using  the omniflush catheter and a benson wire, the aortic bifurcation was crossed and the catheter  was placed into theleft external iliac artery and left runoff was obtained.  right runoff was performed via retrograde sheath injections.  Findings:            Aortogram: No significant renal artery stenosis.  The infrarenal abdominal aorta is widely patent.  The bilateral common and external iliac arteries are widely patent.            Right Lower Extremity: Right common femoral profundofemoral artery are widely patent.  Superficial femoral artery has several areas of mild to moderate stenosis, all less than 50%.  There is two-vessel runoff via the anterior tibial and peroneal artery             Left Lower Extremity: Left common femoral profundofemoral artery widely patent.  There is a stent within the superficial femoral artery with an area of greater than 90% stenosis.  The popliteal artery is patent throughout its course.  The dominant runoff is the peroneal artery however a anterior tibial does appear to cross the ankle  Intervention: After the above images were acquired the decision made to proceed with intervention.  A 6 French 45 cm sheath was advanced over the bifurcation and placed into the proximal left superficial femoral artery.  Additional selective images were performed to better evaluate the anatomy.  The patient had a greater than 90% in-stent stenosis.  I felt laser atherectomy to treat the in-stent stenosis was most appropriate.  A [place] core wire was advanced across the lesion.  A 2.0 laser catheter was used to perform atherectomy of the in-stent stenosis.  2 passes were made and then I used a 5 x 200 drug-coated Ranger balloon to perform balloon angioplasty.  On completion imaging, there did appear to be some haziness around the area of high-grade stenosis and so I inserted a 6 x 80 balloon and treated this.  Follow-up imaging showed widely patent superficial femoral-popliteal artery with no  significant change in runoff.  The long sheath was exchanged out for a short 6 French sheath.  I did not use a closure device.  She will be taken to holding it for sheath pull.  Impression:             #1  Successful laser atherectomy of in-stent stenosis in the left superficial femoral artery and subsequent treatment using a 5 x 200 Ranger balloon and a 6 x 80 Sterling balloon  V. Malvina New, M.D., [facility] [place] Office: [phone] Pager:  [phone]   VAS US  LOWER EXTREMITY ARTERIAL DUPLEX Result Date: 07/01/2023 LOWER EXTREMITY ARTERIAL DUPLEX STUDY Patient Name:  [name]  Date of Exam:   07/01/2023 Medical Rec #: [id number]     Accession #:    [id number] Date of Birth: 1941-01-04     Patient Gender: F Patient Age:   65 years Exam Location:  [facility] Procedure:      VAS US  LOWER EXTREMITY ARTERIAL DUPLEX Referring Phys: GAILE NEW --------------------------------------------------------------------------------  Indications: Ulceration, and peripheral artery disease. High Risk Factors: Hypertension, hyperlipidemia.  Vascular Interventions: Procedure Performed: 1. Ultrasound-guided access, left                         femoral artery 2. Abdominal aortogram 3. Right lower                         extremity runoff 4. Selective injection with catheter in  right superficial femoral artery 5. Laser atherectomy                         and drug-coated balloon angioplasty, right superficial                         femoral and popliteal artery 6. Angioplasty, right                         anterior tibial artery 7. Angioplasty, right peroneal                         artery 8. Conscious sedation, 75 minutes 9. Closure                         device, Mynx. Current ABI:            R=0.86, L=0.77 Performing Technologist: Geni Lodge RVS, RCS  Examination Guidelines: A complete evaluation includes B-mode imaging, spectral Doppler, color  Doppler, and power Doppler as needed of all accessible portions of each vessel. Bilateral testing is considered an integral part of a complete examination. Limited examinations for reoccurring indications may be performed as noted.  +-----------+--------+-----+---------------+----------+--------+ RIGHT      PSV cm/sRatioStenosis       Waveform  Comments +-----------+--------+-----+---------------+----------+--------+ CFA Distal 175                         triphasic          +-----------+--------+-----+---------------+----------+--------+ DFA        142                         biphasic           +-----------+--------+-----+---------------+----------+--------+ SFA Prox   160          30-49% stenosisbiphasic           +-----------+--------+-----+---------------+----------+--------+ SFA Mid    104                         biphasic           +-----------+--------+-----+---------------+----------+--------+ SFA Distal 83                          biphasic           +-----------+--------+-----+---------------+----------+--------+ POP Prox   46                          monophasic         +-----------+--------+-----+---------------+----------+--------+ POP Distal 55                          monophasic         +-----------+--------+-----+---------------+----------+--------+ PTA Mid    111                         triphasic          +-----------+--------+-----+---------------+----------+--------+ PTA Distal 0                                              +-----------+--------+-----+---------------+----------+--------+  PERO Distal28                          monophasic         +-----------+--------+-----+---------------+----------+--------+  +----------+--------+-----+--------+----------+--------+ LEFT      PSV cm/sRatioStenosisWaveform  Comments +----------+--------+-----+--------+----------+--------+ CFA Distal63                   biphasic            +----------+--------+-----+--------+----------+--------+ DFA       51                   monophasic         +----------+--------+-----+--------+----------+--------+ POP Distal29                   monophasic         +----------+--------+-----+--------+----------+--------+  Left Stent(s): +---------------+--------+---------------+----------+--------+ SFA            PSV cm/sStenosis       Waveform  Comments +---------------+--------+---------------+----------+--------+ Prox to Stent  63                     biphasic           +---------------+--------+---------------+----------+--------+ Proximal Stent 50                     biphasic           +---------------+--------+---------------+----------+--------+ Mid Stent      483     50-99% stenosisbiphasic           +---------------+--------+---------------+----------+--------+ Distal Stent   32                     monophasic         +---------------+--------+---------------+----------+--------+ Distal to Stent40                     biphasic           +---------------+--------+---------------+----------+--------+    Summary: Right: 30-49% stenosis noted in the proximal superficial femoral artery. Left: Stenosis is noted within the mid SFA stent. Velocities suggest 50-99%. Left ABIs appear mildly improved from the last exam on 04/01/2023; however, those ABIs reflect a significant drop from the exam prior to that on 02/12/2023 from 1.03 to 0.65.  See table(s) above for measurements and observations. Electronically signed by Gaile New MD on 07/01/2023 at 1:48:30 PM.    Final    VAS US  CAROTID Result Date: 07/01/2023 Carotid Arterial Duplex Study Patient Name:  [name]  Date of Exam:   07/01/2023 Medical Rec #: [id number]     Accession #:    [id number] Date of Birth: 19-May-1940     Patient Gender: F Patient Age:   54 years Exam Location:  [facility] Procedure:      VAS US  CAROTID Referring Phys:  GAILE NEW --------------------------------------------------------------------------------  Indications:  Carotid artery disease.               Known right innominate occlusion. Risk Factors: Hypertension, past history of smoking, coronary artery disease. Performing Technologist: Geni Lodge RVS, RCS  Examination Guidelines: A complete evaluation includes B-mode imaging, spectral Doppler, color Doppler, and power Doppler as needed of all accessible portions of each vessel. Bilateral testing is considered an integral part of a complete examination. Limited examinations for reoccurring indications may be performed as noted.  Right Carotid Findings: +----------+--------+--------+--------+------------------+-------------+  PSV cm/sEDV cm/sStenosisPlaque DescriptionComments      +----------+--------+--------+--------+------------------+-------------+ CCA Prox  15      4                                 dampened flow +----------+--------+--------+--------+------------------+-------------+ CCA Mid   20      0                                 dampened flow +----------+--------+--------+--------+------------------+-------------+ CCA Distal18      1                                 dampened flow +----------+--------+--------+--------+------------------+-------------+ ICA Prox  35      0                                               +----------+--------+--------+--------+------------------+-------------+ ICA Mid   28      0                                               +----------+--------+--------+--------+------------------+-------------+ ICA Distal23      0                                               +----------+--------+--------+--------+------------------+-------------+ ECA       29      6                                               +----------+--------+--------+--------+------------------+-------------+  +----------+--------+-------+-------------+-------------------+           PSV cm/sEDV cmsDescribe     Arm Pressure (mmHG) +----------+--------+-------+-------------+-------------------+ Subclavian               dampened flow130                 +----------+--------+-------+-------------+-------------------+ +---------+--------+--+--------+--+---------+ VertebralPSV cm/s37EDV cm/s21Antegrade +---------+--------+--+--------+--+---------+  Left Carotid Findings: +----------+--------+--------+--------+------------------+-----------------+           PSV cm/sEDV cm/sStenosisPlaque DescriptionComments          +----------+--------+--------+--------+------------------+-----------------+ CCA Prox  71      12                                                  +----------+--------+--------+--------+------------------+-----------------+ CCA Mid   75      17                                                  +----------+--------+--------+--------+------------------+-----------------+ CCA Distal73      17                                                  +----------+--------+--------+--------+------------------+-----------------+  ICA Prox  163     35      1-39%   heterogenous      high end of range +----------+--------+--------+--------+------------------+-----------------+ ICA Mid   168     31                                                  +----------+--------+--------+--------+------------------+-----------------+ ICA Distal96      20                                                  +----------+--------+--------+--------+------------------+-----------------+ ECA                               heterogenous                        +----------+--------+--------+--------+------------------+-----------------+ +----------+--------+--------+----------------+-------------------+           PSV cm/sEDV cm/sDescribe        Arm Pressure (mmHG)  +----------+--------+--------+----------------+-------------------+ Subclavian                Multiphasic, [id number]                 +----------+--------+--------+----------------+-------------------+ +---------+--------+--------+---------+ VertebralPSV cm/sEDV cm/sAntegrade +---------+--------+--------+---------+   Summary: Right Carotid: Velocities in the right ICA are consistent with a 1-39% stenosis. Left Carotid: Velocities in the left ICA are consistent with a 1-39% stenosis               (high end of range). Vertebrals:  Bilateral vertebral arteries demonstrate antegrade flow. Subclavians: Right subclavian artery flow was disturbed. Normal flow              hemodynamics were seen in the left subclavian artery. *See table(s) above for measurements and observations.  Electronically signed by Gaile New MD on 07/01/2023 at 1:48:22 PM.    Final    VAS US  ABI WITH/WO TBI Result Date: 07/01/2023  LOWER EXTREMITY DOPPLER STUDY Patient Name:  [name]  Date of Exam:   07/01/2023 Medical Rec #: [id number]     Accession #:    [id number] Date of Birth: October 03, 1940     Patient Gender: F Patient Age:   63 years Exam Location:  [facility] Procedure:      VAS US  ABI WITH/WO TBI Referring Phys: --------------------------------------------------------------------------------  Indications: Ulceration, and peripheral artery disease. High Risk Factors: Hypertension, hyperlipidemia.  Performing Technologist: Geni Lodge RVS, RCS  Examination Guidelines: A complete evaluation includes at minimum, Doppler waveform signals and systolic blood pressure reading at the level of bilateral brachial, anterior tibial, and posterior tibial arteries, when vessel segments are accessible. Bilateral testing is considered an integral part of a complete examination. Photoelectric Plethysmograph (PPG) waveforms and toe systolic pressure readings are included as required and additional duplex testing as needed.  Limited examinations for reoccurring indications may be performed as noted.  ABI Findings: +---------+------------------+-----+--------+--------+ Right    Rt Pressure (mmHg)IndexWaveformComment  +---------+------------------+-----+--------+--------+ Brachial 133                                     +---------+------------------+-----+--------+--------+  PTA                             absent           +---------+------------------+-----+--------+--------+ DP       140               0.86 biphasic         +---------+------------------+-----+--------+--------+ Great Toe126               0.78                  +---------+------------------+-----+--------+--------+ +--------+------------------+-----+----------+-------+ Left    Lt Pressure (mmHg)IndexWaveform  Comment +--------+------------------+-----+----------+-------+ [id number]                                      +--------+------------------+-----+----------+-------+ PTA     125               0.77 monophasic        +--------+------------------+-----+----------+-------+ DP      104               0.64 monophasic        +--------+------------------+-----+----------+-------+ +-------+-----------+-----------+------------+------------+ ABI/TBIToday's ABIToday's TBIPrevious ABIPrevious TBI +-------+-----------+-----------+------------+------------+ Right  0.86       0.78       1.19        1.10         +-------+-----------+-----------+------------+------------+ Left   0.77       amp        0.65        0.51         +-------+-----------+-----------+------------+------------+  Right ABIs appear decreased. Left ABIs appear increased compared to prior study on 04/01/2023.  Summary: Right: Resting right ankle-brachial index indicates mild right lower extremity arterial disease. The right toe-brachial index is normal. Left: Resting left ankle-brachial index indicates moderate left lower extremity arterial  disease. *See table(s) above for measurements and observations.  Electronically signed by Gaile New MD on 07/01/2023 at 1:48:08 PM.    Final     Alm Schneider, DO  [facility]  If 7PM-7AM, please contact night-coverage [email or web address] Password [email or web address] 07/26/2023, 11:24 AM   LOS: 1 day

## 2023-07-26 NOTE — Progress Notes (Signed)
*  PRELIMINARY RESULTS* Echocardiogram 2D Echocardiogram has been performed.  [name] 07/26/2023, 4:11 PM

## 2023-07-26 NOTE — TOC Progression Note (Signed)
 Transition of Care [facility]) - Progression Note    Patient Details  Name: [name] MRN: [id number] Date of Birth: 10/26/1940  Transition of Care [facility]) CM/SW Contact  Katrine Parody, [place] Phone Number: 07/26/2023, 11:34 AM  Clinical Narrative:    Pt now orange, high risk for readmission. CSW completed assessment with pt at bedside. Pt resides in a single family home with spose and 85 yo [place] dog. She  is mostly independent in completing her ADLs. Has a walk in shower and a chair.  She uses a transport wc to navigate the home., the walker is more difficult but she does have one.   Pts spouse is able to provide transportation to appointments when needed, she decided that she was not "sharp" enough so did not renew her DL. CSW provided HF home screen tracking information, pt was defensive and said I am probably not going to do that. We talked about monitoring weight gain for fluid and she said yes that is why I am here- so she made the connection.  Pt has a HH RN to monitor wound as well. Pt said [facility] today, record says [facility].  TOC to continue to follow.     Expected Discharge Plan: Home w Home Health Services Barriers to Discharge: Continued Medical Work up  Expected Discharge Plan and Services In-house Referral: Clinical Social Work     Living arrangements for the past 2 months: Single Family Home                                       Social Determinants of Health (SDOH) Interventions SDOH Screenings   Food Insecurity: No Food Insecurity (07/25/2023)  Housing: Unknown (07/25/2023)  Transportation Needs: No Transportation Needs (07/25/2023)  Utilities: Not At Risk (07/25/2023)  Depression (PHQ2-9): Low Risk  (06/11/2023)  Social Connections: Unknown (07/25/2023)  Tobacco Use: Medium Risk (07/24/2023)    Readmission Risk Interventions    07/25/2023    3:58 PM 06/18/2022    1:55 PM 03/07/2022    8:09 AM  Readmission Risk Prevention Plan  Transportation  Screening Complete Complete Complete  PCP or Specialist Appt within 5-7 Days Complete    Home Care Screening Complete    Medication Review (RN CM) Complete    HRI or Home Care Consult  Complete Complete  Social Work Consult for Recovery Care Planning/Counseling  Complete Complete  Palliative Care Screening  Not Applicable Not Applicable  Medication Review Oceanographer)  Complete Complete

## 2023-07-26 NOTE — Progress Notes (Signed)
 Mobility Specialist Progress Note:    07/26/23 1110  Mobility  Activity Ambulated with assistance in room  Level of Assistance Standby assist, set-up cues, supervision of patient - no hands on  Assistive Device Other (Comment) (uses wheelchair as walker)  Distance Ambulated (ft) 40 ft  Range of Motion/Exercises Active;All extremities  Activity Response Tolerated well  Mobility Referral Yes  Mobility visit 1 Mobility  Mobility Specialist Start Time (ACUTE ONLY) 1110  Mobility Specialist Stop Time (ACUTE ONLY) 1130  Mobility Specialist Time Calculation (min) (ACUTE ONLY) 20 min   Pt received in bed, husband in room. Agreeable to mobility, required SBA to stand and ambulate with wheelchair (uses as walker). Tolerated well,asx throughout. Returned pt supine, all needs met.  Yeilin Zweber Mobility Specialist Please contact via Special educational needs teacher or  Rehab office at [phone]

## 2023-07-26 NOTE — Care Management Important Message (Signed)
-  Important Message  Patient Details  Name: [name] MRN: [id number] Date of Birth: 08/18/1940   Important Message Given:  Yes - Medicare IM     Alianis Trimmer L Domenic Schoenberger 07/26/2023, 2:37 PM

## 2023-07-27 DIAGNOSIS — J181 Lobar pneumonia, unspecified organism: Secondary | ICD-10-CM | POA: Diagnosis not present

## 2023-07-27 DIAGNOSIS — I5023 Acute on chronic systolic (congestive) heart failure: Secondary | ICD-10-CM | POA: Diagnosis not present

## 2023-07-27 DIAGNOSIS — I4819 Other persistent atrial fibrillation: Secondary | ICD-10-CM | POA: Diagnosis not present

## 2023-07-27 DIAGNOSIS — I739 Peripheral vascular disease, unspecified: Secondary | ICD-10-CM | POA: Diagnosis not present

## 2023-07-27 LAB — CBC
HCT: 27.2 % — ABNORMAL LOW (ref 36.0–46.0)
Hemoglobin: 8.4 g/dL — ABNORMAL LOW (ref 12.0–15.0)
MCH: 31.9 pg (ref 26.0–34.0)
MCHC: 30.9 g/dL (ref 30.0–36.0)
MCV: 103.4 fL — ABNORMAL HIGH (ref 80.0–100.0)
Platelets: 341 10*3/uL (ref 150–400)
RBC: 2.63 MIL/uL — ABNORMAL LOW (ref 3.87–5.11)
RDW: 15.9 % — ABNORMAL HIGH (ref 11.5–15.5)
WBC: 14 10*3/uL — ABNORMAL HIGH (ref 4.0–10.5)
nRBC: 0 % (ref 0.0–0.2)

## 2023-07-27 LAB — BASIC METABOLIC PANEL WITH GFR
Anion gap: 13 (ref 5–15)
BUN: 34 mg/dL — ABNORMAL HIGH (ref 8–23)
CO2: 23 mmol/L (ref 22–32)
Calcium: 7.8 mg/dL — ABNORMAL LOW (ref 8.9–10.3)
Chloride: 100 mmol/L (ref 98–111)
Creatinine, Ser: 1.47 mg/dL — ABNORMAL HIGH (ref 0.44–1.00)
GFR, Estimated: 35 mL/min — ABNORMAL LOW (ref >60)
Glucose, Bld: 82 mg/dL (ref 70–99)
Potassium: 3.6 mmol/L (ref 3.5–5.1)
Sodium: 136 mmol/L (ref 135–145)

## 2023-07-27 LAB — MAGNESIUM: Magnesium: 2.4 mg/dL (ref 1.7–2.4)

## 2023-07-27 MED ORDER — POTASSIUM CHLORIDE CRYS ER 20 MEQ PO TBCR
20.0000 meq | EXTENDED_RELEASE_TABLET | Freq: Once | ORAL | Status: AC
  Administered 2023-07-27: 20 meq via ORAL
  Filled 2023-07-27: qty 1

## 2023-07-27 MED ORDER — TORSEMIDE 20 MG PO TABS: 40.0000 mg | ORAL_TABLET | Freq: Every day | ORAL | Status: DC

## 2023-07-27 MED ORDER — DOXYCYCLINE HYCLATE 100 MG PO TABS
100.0000 mg | ORAL_TABLET | Freq: Two times a day (BID) | ORAL | Status: DC
  Administered 2023-07-27: 100 mg via ORAL
  Filled 2023-07-27: qty 1

## 2023-07-27 MED ORDER — DOXYCYCLINE HYCLATE 100 MG PO TABS: 100.0000 mg | ORAL_TABLET | Freq: Two times a day (BID) | ORAL | 0 refills | Status: DC

## 2023-07-27 MED ORDER — TORSEMIDE 20 MG PO TABS: 40.0000 mg | ORAL_TABLET | Freq: Every day | ORAL | 1 refills | Status: DC

## 2023-07-27 MED ORDER — CEFDINIR 300 MG PO CAPS: 300.0000 mg | ORAL_CAPSULE | Freq: Two times a day (BID) | ORAL | 0 refills | Status: DC

## 2023-07-27 MED ORDER — FUROSEMIDE 10 MG/ML IJ SOLN
40.0000 mg | Freq: Once | INTRAMUSCULAR | Status: AC
  Administered 2023-07-27: 40 mg via INTRAVENOUS
  Filled 2023-07-27: qty 4

## 2023-07-27 NOTE — Discharge Summary (Signed)
 Physician Discharge Summary   Patient: [name] MRN: [id number] DOB: [birth year]  Admit date:     07/24/2023  Discharge date: 07/27/23  Discharge Physician: Myrtie Atkinson Stephanny Tsutsui   PCP: Omie Bickers, MD   Recommendations at discharge:   Please follow up with primary care provider within 1-2 weeks  Please repeat BMP and CBC in one week     Hospital Course: 83 year old female with a history of persistent atrial fibrillation, COPD/bronchiectasis, hypertension, hyperlipidemia, peripheral arterial disease, HFrEF, breast cancer, and essential tremor presenting with at least 2-day history of shortness of breath and orthopnea type symptoms.  The patient denies any fevers, chills, headache, neck pain, nausea, vomiting, diarrhea, abdominal pain.  There is no dysuria or hematuria.  She stated that she weighed about 85 pounds at home on 07/24/2023.  She is uncertain if she is gaining any weight or not.  She is not able to tell me whether her leg edema has been any worse than usual but thinks that is about the same.  She has an occasional nonproductive cough without any hemoptysis. Notably, the patient recently had a balloon angioplasty of her left SFA on 07/16/2023, Dr. Charlotte Cookey.   In addition, the patient had been hospitalized from 03/13/2023 to 03/20/2023 secondary to sepsis from infected right ankle wound and cellulitis.  The patient was treated with 2 weeks of IV vancomycin  and approximately 4 weeks of linezolid .  She has been off antibiotics since the end of January 2025.  In the ED, the patient was afebrile hemodynamically stable.  Oxygen saturation was 83% on room air.  WBC 12.1, hemoglobin 9.5, platelet 341.  BMP showed sodium 140, potassium 4.6, bicarbonate 22, serum creatinine 1.42.  AST 35, ALT 42, alk phosphatase 139, total bilirubin 0.8.  BNP 3972.  Troponin 18>> 17.  EKG shows sinus rhythm with nonspecific T wave changes.  Chest x-ray showed increased right lower lobe opacity.  The patient was started on  ceftriaxone  and azithromycin .  Furosemide  20 mg IV was given.  The patient was admitted for further evaluation and treatment.  Assessment and Plan: Acute respiratory failure with hypoxia -Secondary to pneumonia and pulmonary edema - Stable on 2 L - Presented with saturation in the 80s and tachypnea - 07/25/23 CT chest--RML alveolar opacity. Interval increase size of LLL cavitary nodule up to 2.0 x 1.7 cm; slight increase mediastinal LN; tiny bilateral pleural effusions - Continued ceftriaxone  and doxy -d/c home with 2 L [place]   Lobar pneumonia - Check PCT 0.10 - MRSA screen--neg - 4/17 CT chest--RML alveolar opacity. Interval increase size of LLL cavitary nodule up tl 2.0 x 1.7 cm; slight increase mediastinal LN - Continue ceftriaxone  and azithromycin  -pt is aware of cavitary nodule increased size--does not want any further work up -d/c home with 4 more days cefdinir  and doxy   Acute on chronic HFrEF -05/24/2022 echo EF 40 to 45%, global HK, normal RVEF, PASP 39.7, mild to moderate MR  - Continued IV furosemide  during hospitalization - Accurate I's and O's--incomplete - Presented with BNP 3972 - 07/26/23 Echo--EF 45%, no WMA, G3DD, normal RVF, mild Mr/TR - 07/25/23 CT chest--RML alveolar opacity. Interval increase size of LLL cavitary nodule up to 2.0 x 1.7 cm; slight increase mediastinal LN; tiny bilateral pleural effusions -d/c home with torsemide  40 mg po daily (previously on furosemide  40 mg po daily)   Persistent atrial fibrillation - Continue metoprolol  and amiodarone  - rate controlled - Continue apixaban  -Patient was not felt to be a good  candidate for antiarrhythmic therapy secondary to her multiple comorbidities    COPD/bronchiectasis -Quit smoking 30 years ago -Not on supplemental oxygen at home -Started bronchodilators   Essential hypertension -Continue metoprolol    PAD (peripheral artery disease) (HCC) -Continue follow-up with [facility] -Wound care service consultation for  recommendations -May 2024 had laser atherectomy of right anterior tibial for non healing right ulcer ABI 0.87  -07/16/23 balloon angioplasty of her left SFA    CKD stage IIIb -Baseline creatinine 1.2-1.5 -Monitor with diuresis   Hyperlipidemia -Continue statin   Essential tremor -Outpatient follow-up with neurology -Hold mysoline  with which pt agrees due to interaction with apixaban  -Outpatient follow-up with Dr. Winferd Hatter   History of breast cancer -Status post bilateral mastectomy, chemo and radiation -Appears to be currently in remission -Continue outpatient follow-up screening.   Severe osteopenia/osteoporosis -Continue outpatient treatment with Prolia  after discharge.   Underweight -BMI 14.63   Goals of Care -pt confirms DNR        Consultants: none Procedures performed: none  Disposition: Home Diet recommendation:  Cardiac diet DISCHARGE MEDICATION: Allergies as of 07/27/2023       Reactions   Iodine  Rash, Other (See Comments)   BETADINE, blistering rash/burns   Iohexol  Rash, Other (See Comments)   Blisters; PT NEEDS 13-HOUR PREP   Petroleum Gauze Non-woven 3x9" [wound Dressings] Other (See Comments)   BURNING SENSATION, RED SKIN   Sulfa  Antibiotics Nausea And Vomiting   Wound Dressing Adhesive Other (See Comments)   Burns skin   Tape Itching, Rash, Other (See Comments)   "DO NOT USE ADHESIVE TAPE" Not even a Band Aid" NO PAPER TAPE Burns/tears skin Skin is very very thin        Medication List     STOP taking these medications    furosemide  20 MG tablet Commonly known as: LASIX        TAKE these medications    acetaminophen  325 MG tablet Commonly known as: TYLENOL  Take 2 tablets (650 mg total) by mouth every 6 (six) hours as needed for mild pain (pain score 1-3) (or Fever >/= 101).   albuterol  108 (90 Base) MCG/ACT inhaler Commonly known as: VENTOLIN  HFA Inhale 2 puffs into the lungs every 6 (six) hours as needed for wheezing or shortness  of breath.   AMBULATORY NON FORMULARY MEDICATION Dispense one Readi Steadi glove   amiodarone  200 MG tablet Commonly known as: PACERONE  Take 0.5 tablets (100 mg total) by mouth every other day. What changed:  how much to take when to take this   apixaban  2.5 MG Tabs tablet Commonly known as: Eliquis  Take 1 tablet (2.5 mg total) by mouth 2 (two) times daily.   ascorbic acid  500 MG tablet Commonly known as: VITAMIN C  Take 500 mg by mouth in the morning.   CALCIUM PO Take 1,200 mg by mouth 2 (two) times daily. 600 mg each   cefdinir  300 MG capsule Commonly known as: OMNICEF  Take 1 capsule (300 mg total) by mouth 2 (two) times daily.   clopidogrel  75 MG tablet Commonly known as: PLAVIX  TAKE 1 TABLET EVERY MORNING   denosumab  60 MG/ML Sosy injection Commonly known as: PROLIA  Inject 60 mg into the skin every 6 (six) months.   doxycycline  100 MG tablet Commonly known as: VIBRA -TABS Take 1 tablet (100 mg total) by mouth every 12 (twelve) hours.   Incruse Ellipta  62.5 MCG/ACT Aepb Generic drug: umeclidinium bromide  Inhale 1 puff into the lungs daily.   IRON PO Take 65 mg by mouth  at bedtime.   levothyroxine 25 MCG tablet Commonly known as: SYNTHROID Take 25 mcg by mouth daily before breakfast.   MAGNESIUM  OXIDE PO Take 500 mg by mouth every morning.   metoprolol  tartrate 25 MG tablet Commonly known as: LOPRESSOR  Take 3 tablets (75 mg total) by mouth 2 (two) times daily.   multivitamin with minerals Tabs tablet Take 1 tablet by mouth every morning.   mupirocin  ointment 2 % Commonly known as: BACTROBAN  Apply 1 Application topically daily.   pantoprazole  40 MG tablet Commonly known as: PROTONIX  Take 1 tablet (40 mg total) by mouth daily.   potassium chloride  10 MEQ tablet Commonly known as: KLOR-CON  M Take 2 tablets (20 mEq total) by mouth daily. What changed: how much to take   pravastatin  40 MG tablet Commonly known as: PRAVACHOL  TAKE 1 TABLET  DAILY What changed: when to take this   Salonpas 3.04-14-08 % Ptch Generic drug: Camphor-Menthol-Methyl Sal Place 1 patch onto the skin daily as needed (pain.).   torsemide  20 MG tablet Commonly known as: DEMADEX  Take 2 tablets (40 mg total) by mouth daily. Start taking on: July 28, 2023               Durable Medical Equipment  (From admission, onward)           Start     Ordered   07/27/23 1122  For home use only DME oxygen  Once       Comments: POC for CHF  Question Answer Comment  Length of Need 6 Months   Mode or (Route) Nasal cannula   Liters per Minute 2   Frequency Continuous (stationary and portable oxygen unit needed)   Oxygen conserving device Yes   Oxygen delivery system Gas      07/27/23 1122            Discharge Exam: Filed Weights   07/24/23 2138 07/25/23 1401 07/27/23 0416  Weight: 36.3 kg 42 kg 42.3 kg   HEENT:  [place]/AT, No thrush, no icterus CV:  IRRR, no rub, no S3, no S4 Lung:  diminished BS.  RLL crackles. No wheeze Abd:  soft/+BS, NT Ext:  No edema, no lymphangitis, no synovitis, no rash   Condition at discharge: stable  The results of significant diagnostics from this hospitalization (including imaging, microbiology, ancillary and laboratory) are listed below for reference.   Imaging Studies: ECHOCARDIOGRAM COMPLETE Result Date: 07/26/2023    ECHOCARDIOGRAM REPORT   Patient Name:   [name] Date of Exam: 07/26/2023 Medical Rec #:  [id number]    Height:       62.0 in Accession #:    [id number]   Weight:       92.6 lb Date of Birth:  1941-02-23    BSA:          1.378 m Patient Age:    83 years     BP:           145/87 mmHg Patient Gender: F            HR:           60 bpm. Exam Location:  [facility] Procedure: 2D Echo, Cardiac Doppler and Color Doppler (Both Spectral and Color            Flow Doppler were utilized during procedure). Indications:    CHF  History:        Patient has prior history of Echocardiogram examinations, most  recent 05/24/2022. CHF, CAD, PAD and COPD, Arrythmias:Atrial                 Flutter, Signs/Symptoms:Dizziness/Lightheadedness and Syncope;                 Risk Factors:Hypertension, Dyslipidemia and Former Smoker. CKD.  Sonographer:    Clarke Crouch Referring Phys: [phone] Tyaira Heward  Sonographer Comments: Image acquisition challenging due to patient body habitus and Image acquisition challenging due to COPD. IMPRESSIONS  1. Left ventricular ejection fraction, by estimation, is 45%. The left ventricle has mildly decreased function. The left ventricle has no regional wall motion abnormalities. There is mild left ventricular hypertrophy. Left ventricular diastolic parameters are consistent with Grade III diastolic dysfunction (restrictive).  2. Right ventricular systolic function is normal. The right ventricular size is mildly enlarged. There is severely elevated pulmonary artery systolic pressure. The estimated right ventricular systolic pressure is 61.0 mmHg.  3. Left atrial size was severely dilated.  4. Right atrial size was moderately dilated.  5. The mitral valve is grossly normal. Mild mitral valve regurgitation. No evidence of mitral stenosis.  6. The tricuspid valve is abnormal. Tricuspid valve regurgitation is mild to moderate.  7. The aortic valve is tricuspid. Aortic valve regurgitation is mild. No aortic stenosis is present.  8. The inferior vena cava is dilated in size with <50% respiratory variability, suggesting right atrial pressure of 15 mmHg. Comparison(s): No significant change from prior study. FINDINGS  Left Ventricle: Left ventricular ejection fraction, by estimation, is 45%. The left ventricle has mildly decreased function. The left ventricle has no regional wall motion abnormalities. Strain was performed and the global longitudinal strain is indeterminate. The left ventricular internal cavity size was normal in size. There is mild left ventricular hypertrophy. Left ventricular  diastolic parameters are consistent with Grade III diastolic dysfunction (restrictive). Right Ventricle: The right ventricular size is mildly enlarged. No increase in right ventricular wall thickness. Right ventricular systolic function is normal. There is severely elevated pulmonary artery systolic pressure. The tricuspid regurgitant velocity is 3.39 m/s, and with an assumed right atrial pressure of 15 mmHg, the estimated right ventricular systolic pressure is 61.0 mmHg. Left Atrium: Left atrial size was severely dilated. Right Atrium: Right atrial size was moderately dilated. Pericardium: There is no evidence of pericardial effusion. Mitral Valve: The mitral valve is grossly normal. Mild mitral valve regurgitation. No evidence of mitral valve stenosis. MV peak gradient, 3.4 mmHg. The mean mitral valve gradient is 1.0 mmHg. Tricuspid Valve: The tricuspid valve is abnormal. Tricuspid valve regurgitation is mild to moderate. No evidence of tricuspid stenosis. Aortic Valve: The aortic valve is tricuspid. Aortic valve regurgitation is mild. No aortic stenosis is present. Aortic valve mean gradient measures 2.0 mmHg. Aortic valve peak gradient measures 3.2 mmHg. Aortic valve area, by VTI measures 1.76 cm. Pulmonic Valve: The pulmonic valve was normal in structure. Pulmonic valve regurgitation is mild. No evidence of pulmonic stenosis. Aorta: The aortic root is normal in size and structure. Venous: The inferior vena cava is dilated in size with less than 50% respiratory variability, suggesting right atrial pressure of 15 mmHg. IAS/Shunts: No atrial level shunt detected by color flow Doppler. Additional Comments: 3D was performed not requiring image post processing on an independent workstation and was indeterminate.  LEFT VENTRICLE PLAX 2D LVIDd:         4.00 cm     Diastology LVIDs:         3.10 cm  LV e' lateral:   11.40 cm/s LV PW:         1.30 cm     LV E/e' lateral: 7.6 LV IVS:        1.20 cm LVOT diam:     1.80  cm LV SV:         39 LV SV Index:   29 LVOT Area:     2.54 cm  LV Volumes (MOD) LV vol d, MOD A2C: 44.0 ml LV vol d, MOD A4C: 42.8 ml LV vol s, MOD A2C: 24.1 ml LV vol s, MOD A4C: 17.9 ml LV SV MOD A2C:     19.9 ml LV SV MOD A4C:     42.8 ml LV SV MOD BP:      22.3 ml RIGHT VENTRICLE RV Basal diam:  3.65 cm RV Mid diam:    2.80 cm RV S prime:     8.59 cm/s TAPSE (M-mode): 1.9 cm LEFT ATRIUM             Index        RIGHT ATRIUM           Index LA diam:        4.40 cm 3.19 cm/m   RA Area:     19.40 cm LA Vol (A2C):   79.7 ml 57.83 ml/m  RA Volume:   52.60 ml  38.17 ml/m LA Vol (A4C):   76.0 ml 55.15 ml/m LA Biplane Vol: 78.0 ml 56.60 ml/m  AORTIC VALVE                    PULMONIC VALVE AV Area (Vmax):    1.95 cm     PV Vmax:          0.74 m/s AV Area (Vmean):   1.54 cm     PV Peak grad:     2.2 mmHg AV Area (VTI):     1.76 cm     PR End Diast Vel: 6.97 msec AV Vmax:           89.40 cm/s AV Vmean:          62.400 cm/s AV VTI:            0.224 m AV Peak Grad:      3.2 mmHg AV Mean Grad:      2.0 mmHg LVOT Vmax:         68.50 cm/s LVOT Vmean:        37.800 cm/s LVOT VTI:          0.155 m LVOT/AV VTI ratio: 0.69  AORTA Ao Root diam: 3.00 cm Ao Asc diam:  3.70 cm MITRAL VALVE               TRICUSPID VALVE MV Area (PHT): 5.62 cm    TR Peak grad:   46.0 mmHg MV Area VTI:   1.75 cm    TR Vmax:        339.00 cm/s MV Peak grad:  3.4 mmHg MV Mean grad:  1.0 mmHg    SHUNTS MV Vmax:       0.92 m/s    Systemic VTI:  0.16 m MV Vmean:      36.9 cm/s   Systemic Diam: 1.80 cm MV Decel Time: 135 msec MV E velocity: 86.50 cm/s MV A velocity: 44.60 cm/s MV E/A ratio:  1.94 Vishnu Priya Mallipeddi Electronically signed by Lucetta Russel Mallipeddi Signature Date/Time: 07/26/2023/4:26:03 PM    Final  CT CHEST WO CONTRAST Result Date: 07/26/2023 CLINICAL DATA:  Respiratory illness.  Follow-up infiltrates. EXAM: CT CHEST WITHOUT CONTRAST TECHNIQUE: Multidetector CT imaging of the chest was performed following the standard  protocol without IV contrast. RADIATION DOSE REDUCTION: This exam was performed according to the departmental dose-optimization program which includes automated exposure control, adjustment of the mA and/or kV according to patient size and/or use of iterative reconstruction technique. COMPARISON:  PET-CT 09/06/2022.  Chest CT 08/16/2022 FINDINGS: Cardiovascular: The heart is enlarged. No substantial pericardial effusion. Coronary artery calcification is evident. Moderate atherosclerotic calcification is noted in the wall of the thoracic aorta. Mediastinum/Nodes: Mediastinal lymphadenopathy again noted. Index precarinal lymph node measured previously at 12 mm short axis is 15 mm short axis today. 16 mm short axis subcarinal lymph node was 13 mm previously. No evidence for gross hilar lymphadenopathy although assessment is limited by the lack of intravenous contrast on the current study. The esophagus has normal imaging features. There is no axillary lymphadenopathy. Lungs/Pleura: Centrilobular and paraseptal emphysema evident. Interstitial an alveolar opacity in the right middle lobe is new in the interval with stable right apical scarring. Similar interstitial and airspace disease in the right lower lobe is associated with volume loss in the lower lungs bilaterally. Left lower lobe cavitary nodule measures 2.0 x 1.7 cm today on image 103/3, increased from 1.2 x 1.2 cm previously. Small left and tiny right pleural effusions. Upper Abdomen: Large hiatal hernia again noted. Musculoskeletal: No worrisome lytic or sclerotic osseous abnormality. Compression fractures at T10 and T12 are similar to prior. IMPRESSION: 1. Interval increase in size of the left lower lobe cavitary nodule, now measuring 2.0 x 1.7 cm today, increased from 1.2 x 1.2 cm previously. Imaging features remain concerning for neoplasm. 2. Interstitial and alveolar opacity in the right middle lobe is new in the interval. Imaging features are compatible  with pneumonia. Close follow-up recommended to ensure resolution. 3. Similar interstitial and airspace disease in the right lower lobe is associated with volume loss in the lower lungs bilaterally. 4. Small left and tiny right pleural effusions. 5. Mediastinal lymphadenopathy, slightly increased in the interval. Metastatic disease not excluded. 6. Large hiatal hernia. 7.  Emphysema (ICD10-J43.9) and Aortic Atherosclerosis (ICD10-170.0) Electronically Signed   By: Donnal Fusi M.D.   On: 07/26/2023 10:06   DG Chest Port 1 View Result Date: 07/24/2023 CLINICAL DATA:  Dyspnea EXAM: PORTABLE CHEST 1 VIEW COMPARISON:  03/20/2023 FINDINGS: Cardiac shadow is mildly prominent but stable. Aortic calcifications are noted. Lungs are well aerated bilaterally. Patchy airspace opacity is noted in the right lung particularly in the right upper lobe along the minor fissure. Underlying chronic interstitial changes are seen. No effusion is noted. No bony abnormality is seen. IMPRESSION: Airspace opacity on the right particularly in the right upper lobe consistent with acute on chronic infiltrate. Electronically Signed   By: Violeta Grey M.D.   On: 07/24/2023 23:34   PERIPHERAL VASCULAR CATHETERIZATION Result Date: 07/16/2023 Images from the original result were not included.   Patient name: SULMA RUFFINO    MRN: [id number]        DOB: February 25, 1941          Sex: female  07/16/2023 Pre-operative Diagnosis: IN-Stent stenosis Post-operative diagnosis:  Same Surgeon:  Gareld June Procedure Performed:            1.  Ultrasound-guided access, right femoral artery            2.  Abdominal aortogram  3.  Bilateral lower extremity runoff            4.  Small to injection with catheter in left SFA            5.  Laser atherectomy left superficial femoral artery            6.  Drug-coated balloon angioplasty, left superficial femoral artery            7.  Conscious sedation, 52 minutes   Indications: This is an 83 year old female with  history of bilateral lower extremity interventions for wounds.  She currently has a right foot wound but has high-grade stenosis in her left leg so she comes in today for further evaluation.  Procedure:  The patient was identified in the holding area and taken to room 8.  The patient was then placed supine on the table and prepped and draped in the usual sterile fashion.  A time out was called.  Conscious sedation was administered with the use of IV fentanyl  and Versed  under continuous physician and nurse monitoring.  Heart rate, blood pressure, and oxygen saturation were continuously monitored.  Total sedation time was 52 minutes.  Ultrasound was used to evaluate the right common femoral artery.  It was patent .  A digital ultrasound image was acquired.  A micropuncture needle was used to access the right common femoral artery under ultrasound guidance.  An 018 wire was advanced without resistance and a micropuncture sheath was placed.  The 018 wire was removed and a benson wire was placed.  The micropuncture sheath was exchanged for a 5 french sheath.  An omniflush catheter was advanced over the wire to the level of L-1.  An abdominal angiogram was obtained.  Next, using the omniflush catheter and a benson wire, the aortic bifurcation was crossed and the catheter was placed into theleft external iliac artery and left runoff was obtained.  right runoff was performed via retrograde sheath injections.  Findings:            Aortogram: No significant renal artery stenosis.  The infrarenal abdominal aorta is widely patent.  The bilateral common and external iliac arteries are widely patent.            Right Lower Extremity: Right common femoral profundofemoral artery are widely patent.  Superficial femoral artery has several areas of mild to moderate stenosis, all less than 50%.  There is two-vessel runoff via the anterior tibial and peroneal artery             Left Lower Extremity: Left common femoral profundofemoral  artery widely patent.  There is a stent within the superficial femoral artery with an area of greater than 90% stenosis.  The popliteal artery is patent throughout its course.  The dominant runoff is the peroneal artery however a anterior tibial does appear to cross the ankle  Intervention: After the above images were acquired the decision made to proceed with intervention.  A 6 French 45 cm sheath was advanced over the bifurcation and placed into the proximal left superficial femoral artery.  Additional selective images were performed to better evaluate the anatomy.  The patient had a greater than 90% in-stent stenosis.  I felt laser atherectomy to treat the in-stent stenosis was most appropriate.  A [place] core wire was advanced across the lesion.  A 2.0 laser catheter was used to perform atherectomy of the in-stent stenosis.  2 passes were made and then I used a 5 x  200 drug-coated Ranger balloon to perform balloon angioplasty.  On completion imaging, there did appear to be some haziness around the area of high-grade stenosis and so I inserted a 6 x 80 balloon and treated this.  Follow-up imaging showed widely patent superficial femoral-popliteal artery with no significant change in runoff.  The long sheath was exchanged out for a short 6 [place] sheath.  I did not use a closure device.  She will be taken to holding it for sheath pull.  Impression:             #1  Successful laser atherectomy of in-stent stenosis in the left superficial femoral artery and subsequent treatment using a 5 x 200 Ranger balloon and a 6 x 80 [place] balloon  V. Gareld June, M.D., [facility] [place] Office: [phone] Pager:  [phone]   VAS US  LOWER EXTREMITY ARTERIAL DUPLEX Result Date: 07/01/2023 LOWER EXTREMITY ARTERIAL DUPLEX STUDY Patient Name:  SLOAN GALENTINE  Date of Exam:   07/01/2023 Medical Rec #: [id number]     Accession #:    [id number] Date of Birth: 10-15-1940     Patient Gender: F  Patient Age:   35 years Exam Location:  [facility] Procedure:      VAS US  LOWER EXTREMITY ARTERIAL DUPLEX Referring Phys: Genny Kid --------------------------------------------------------------------------------  Indications: Ulceration, and peripheral artery disease. High Risk Factors: Hypertension, hyperlipidemia.  Vascular Interventions: Procedure Performed: 1. Ultrasound-guided access, left                         femoral artery 2. Abdominal aortogram 3. Right lower                         extremity runoff 4. Selective injection with catheter in                         right superficial femoral artery 5. Laser atherectomy                         and drug-coated balloon angioplasty, right superficial                         femoral and popliteal artery 6. Angioplasty, right                         anterior tibial artery 7. Angioplasty, right peroneal                         artery 8. Conscious sedation, 75 minutes 9. Closure                         device, Mynx. Current ABI:            R=0.86, L=0.77 Performing Technologist: Parke Boll RVS, RCS  Examination Guidelines: A complete evaluation includes B-mode imaging, spectral Doppler, color Doppler, and power Doppler as needed of all accessible portions of each vessel. Bilateral testing is considered an integral part of a complete examination. Limited examinations for reoccurring indications may be performed as noted.  +-----------+--------+-----+---------------+----------+--------+ RIGHT      PSV cm/sRatioStenosis       Waveform  Comments +-----------+--------+-----+---------------+----------+--------+ CFA Distal 175  triphasic          +-----------+--------+-----+---------------+----------+--------+ DFA        142                         biphasic           +-----------+--------+-----+---------------+----------+--------+ SFA Prox   160          30-49% stenosisbiphasic            +-----------+--------+-----+---------------+----------+--------+ SFA Mid    104                         biphasic           +-----------+--------+-----+---------------+----------+--------+ SFA Distal 83                          biphasic           +-----------+--------+-----+---------------+----------+--------+ POP Prox   46                          monophasic         +-----------+--------+-----+---------------+----------+--------+ POP Distal 55                          monophasic         +-----------+--------+-----+---------------+----------+--------+ PTA Mid    111                         triphasic          +-----------+--------+-----+---------------+----------+--------+ PTA Distal 0                                              +-----------+--------+-----+---------------+----------+--------+ PERO Distal28                          monophasic         +-----------+--------+-----+---------------+----------+--------+  +----------+--------+-----+--------+----------+--------+ LEFT      PSV cm/sRatioStenosisWaveform  Comments +----------+--------+-----+--------+----------+--------+ CFA Distal63                   biphasic           +----------+--------+-----+--------+----------+--------+ DFA       51                   monophasic         +----------+--------+-----+--------+----------+--------+ POP Distal29                   monophasic         +----------+--------+-----+--------+----------+--------+  Left Stent(s): +---------------+--------+---------------+----------+--------+ SFA            PSV cm/sStenosis       Waveform  Comments +---------------+--------+---------------+----------+--------+ Prox to Stent  63                     biphasic           +---------------+--------+---------------+----------+--------+ Proximal Stent 50                     biphasic           +---------------+--------+---------------+----------+--------+  Mid Stent      483     50-99% stenosisbiphasic           +---------------+--------+---------------+----------+--------+  Distal Stent   32                     monophasic         +---------------+--------+---------------+----------+--------+ Distal to Stent40                     biphasic           +---------------+--------+---------------+----------+--------+    Summary: Right: 30-49% stenosis noted in the proximal superficial femoral artery. Left: Stenosis is noted within the mid SFA stent. Velocities suggest 50-99%. Left ABIs appear mildly improved from the last exam on 04/01/2023; however, those ABIs reflect a significant drop from the exam prior to that on 02/12/2023 from 1.03 to 0.65.  See table(s) above for measurements and observations. Electronically signed by Genny Kid MD on 07/01/2023 at 1:48:30 PM.    Final    VAS US  CAROTID Result Date: 07/01/2023 Carotid Arterial Duplex Study Patient Name:  MARTESHA NIEDERMEIER  Date of Exam:   07/01/2023 Medical Rec #: [id number]     Accession #:    [id number] Date of Birth: [birth year]     Patient Gender: F Patient Age:   1 years Exam Location:  [facility] Procedure:      VAS US  CAROTID Referring Phys: Genny Kid --------------------------------------------------------------------------------  Indications:  Carotid artery disease.               Known right innominate occlusion. Risk Factors: Hypertension, past history of smoking, coronary artery disease. Performing Technologist: Parke Boll RVS, RCS  Examination Guidelines: A complete evaluation includes B-mode imaging, spectral Doppler, color Doppler, and power Doppler as needed of all accessible portions of each vessel. Bilateral testing is considered an integral part of a complete examination. Limited examinations for reoccurring indications may be performed as noted.  Right Carotid Findings: +----------+--------+--------+--------+------------------+-------------+            PSV cm/sEDV cm/sStenosisPlaque DescriptionComments      +----------+--------+--------+--------+------------------+-------------+ CCA Prox  15      4                                 dampened flow +----------+--------+--------+--------+------------------+-------------+ CCA Mid   20      0                                 dampened flow +----------+--------+--------+--------+------------------+-------------+ CCA Distal18      1                                 dampened flow +----------+--------+--------+--------+------------------+-------------+ ICA Prox  35      0                                               +----------+--------+--------+--------+------------------+-------------+ ICA Mid   28      0                                               +----------+--------+--------+--------+------------------+-------------+ ICA Distal23      0                                               +----------+--------+--------+--------+------------------+-------------+  ECA       29      6                                               +----------+--------+--------+--------+------------------+-------------+ +----------+--------+-------+-------------+-------------------+           PSV cm/sEDV cmsDescribe     Arm Pressure (mmHG) +----------+--------+-------+-------------+-------------------+ Subclavian               dampened flow130                 +----------+--------+-------+-------------+-------------------+ +---------+--------+--+--------+--+---------+ VertebralPSV cm/s37EDV cm/s21Antegrade +---------+--------+--+--------+--+---------+  Left Carotid Findings: +----------+--------+--------+--------+------------------+-----------------+           PSV cm/sEDV cm/sStenosisPlaque DescriptionComments          +----------+--------+--------+--------+------------------+-----------------+ CCA Prox  71      12                                                   +----------+--------+--------+--------+------------------+-----------------+ CCA Mid   75      17                                                  +----------+--------+--------+--------+------------------+-----------------+ CCA Distal73      17                                                  +----------+--------+--------+--------+------------------+-----------------+ ICA Prox  163     35      1-39%   heterogenous      high end of range +----------+--------+--------+--------+------------------+-----------------+ ICA Mid   168     31                                                  +----------+--------+--------+--------+------------------+-----------------+ ICA Distal96      20                                                  +----------+--------+--------+--------+------------------+-----------------+ ECA                               heterogenous                        +----------+--------+--------+--------+------------------+-----------------+ +----------+--------+--------+----------------+-------------------+           PSV cm/sEDV cm/sDescribe        Arm Pressure (mmHG) +----------+--------+--------+----------------+-------------------+ Subclavian                Multiphasic, [id number]                 +----------+--------+--------+----------------+-------------------+ +---------+--------+--------+---------+ VertebralPSV cm/sEDV cm/sAntegrade +---------+--------+--------+---------+  Summary: Right Carotid: Velocities in the right ICA are consistent with a 1-39% stenosis. Left Carotid: Velocities in the left ICA are consistent with a 1-39% stenosis               (high end of range). Vertebrals:  Bilateral vertebral arteries demonstrate antegrade flow. Subclavians: Right subclavian artery flow was disturbed. Normal flow              hemodynamics were seen in the left subclavian artery. *See table(s) above for measurements and observations.  Electronically signed  by Genny Kid MD on 07/01/2023 at 1:48:22 PM.    Final    VAS US  ABI WITH/WO TBI Result Date: 07/01/2023  LOWER EXTREMITY DOPPLER STUDY Patient Name:  CALYSSA ZOBRIST  Date of Exam:   07/01/2023 Medical Rec #: [id number]     Accession #:    [id number] Date of Birth: May 15, 1940     Patient Gender: F Patient Age:   89 years Exam Location:  [facility] Procedure:      VAS US  ABI WITH/WO TBI Referring Phys: --------------------------------------------------------------------------------  Indications: Ulceration, and peripheral artery disease. High Risk Factors: Hypertension, hyperlipidemia.  Performing Technologist: Parke Boll RVS, RCS  Examination Guidelines: A complete evaluation includes at minimum, Doppler waveform signals and systolic blood pressure reading at the level of bilateral brachial, anterior tibial, and posterior tibial arteries, when vessel segments are accessible. Bilateral testing is considered an integral part of a complete examination. Photoelectric Plethysmograph (PPG) waveforms and toe systolic pressure readings are included as required and additional duplex testing as needed. Limited examinations for reoccurring indications may be performed as noted.  ABI Findings: +---------+------------------+-----+--------+--------+ Right    Rt Pressure (mmHg)IndexWaveformComment  +---------+------------------+-----+--------+--------+ Brachial 133                                     +---------+------------------+-----+--------+--------+ PTA                             absent           +---------+------------------+-----+--------+--------+ DP       140               0.86 biphasic         +---------+------------------+-----+--------+--------+ Great Toe126               0.78                  +---------+------------------+-----+--------+--------+ +--------+------------------+-----+----------+-------+ Left    Lt Pressure (mmHg)IndexWaveform  Comment  +--------+------------------+-----+----------+-------+ [id number]                                      +--------+------------------+-----+----------+-------+ PTA     125               0.77 monophasic        +--------+------------------+-----+----------+-------+ DP      104               0.64 monophasic        +--------+------------------+-----+----------+-------+ +-------+-----------+-----------+------------+------------+ ABI/TBIToday's ABIToday's TBIPrevious ABIPrevious TBI +-------+-----------+-----------+------------+------------+ Right  0.86       0.78       1.19        1.10         +-------+-----------+-----------+------------+------------+ Left   0.77  amp        0.65        0.51         +-------+-----------+-----------+------------+------------+  Right ABIs appear decreased. Left ABIs appear increased compared to prior study on 04/01/2023.  Summary: Right: Resting right ankle-brachial index indicates mild right lower extremity arterial disease. The right toe-brachial index is normal. Left: Resting left ankle-brachial index indicates moderate left lower extremity arterial disease. *See table(s) above for measurements and observations.  Electronically signed by Genny Kid MD on 07/01/2023 at 1:48:08 PM.    Final     Microbiology: Results for orders placed or performed during the hospital encounter of 07/24/23  Resp panel by RT-PCR (RSV, Flu A&B, Covid) Anterior Nasal Swab     Status: None   Collection Time: 07/25/23  8:11 AM   Specimen: Anterior Nasal Swab  Result Value Ref Range Status   SARS Coronavirus 2 by RT PCR NEGATIVE NEGATIVE Final    Comment: (NOTE) SARS-CoV-2 target nucleic acids are NOT DETECTED.  The SARS-CoV-2 RNA is generally detectable in upper respiratory specimens during the acute phase of infection. The lowest concentration of SARS-CoV-2 viral copies this assay can detect is 138 copies/mL. A negative result does not preclude  SARS-Cov-2 infection and should not be used as the sole basis for treatment or other patient management decisions. A negative result may occur with  improper specimen collection/handling, submission of specimen other than nasopharyngeal swab, presence of viral mutation(s) within the areas targeted by this assay, and inadequate number of viral copies(<138 copies/mL). A negative result must be combined with clinical observations, patient history, and epidemiological information. The expected result is Negative.  Fact Sheet for Patients:  BloggerCourse.com  Fact Sheet for Healthcare Providers:  SeriousBroker.it  This test is no t yet approved or cleared by the [place]  FDA and  has been authorized for detection and/or diagnosis of SARS-CoV-2 by FDA under an Emergency Use Authorization (EUA). This EUA will remain  in effect (meaning this test can be used) for the duration of the COVID-19 declaration under Section 564(b)(1) of the Act, 21 U.S.C.section 360bbb-3(b)(1), unless the authorization is terminated  or revoked sooner.       Influenza A by PCR NEGATIVE NEGATIVE Final   Influenza B by PCR NEGATIVE NEGATIVE Final    Comment: (NOTE) The Xpert Xpress SARS-CoV-2/FLU/RSV plus assay is intended as an aid in the diagnosis of influenza from Nasopharyngeal swab specimens and should not be used as a sole basis for treatment. Nasal washings and aspirates are unacceptable for Xpert Xpress SARS-CoV-2/FLU/RSV testing.  Fact Sheet for Patients: BloggerCourse.com  Fact Sheet for Healthcare Providers: SeriousBroker.it  This test is not yet approved or cleared by the [place]  FDA and has been authorized for detection and/or diagnosis of SARS-CoV-2 by FDA under an Emergency Use Authorization (EUA). This EUA will remain in effect (meaning this test can be used) for the duration of  the COVID-19 declaration under Section 564(b)(1) of the Act, 21 U.S.C. section 360bbb-3(b)(1), unless the authorization is terminated or revoked.     Resp Syncytial Virus by PCR NEGATIVE NEGATIVE Final    Comment: (NOTE) Fact Sheet for Patients: BloggerCourse.com  Fact Sheet for Healthcare Providers: SeriousBroker.it  This test is not yet approved or cleared by the [place]  FDA and has been authorized for detection and/or diagnosis of SARS-CoV-2 by FDA under an Emergency Use Authorization (EUA). This EUA will remain in effect (meaning this test can be used) for the duration of the  COVID-19 declaration under Section 564(b)(1) of the Act, 21 U.S.C. section 360bbb-3(b)(1), unless the authorization is terminated or revoked.  Performed at [facility], [address], [place], [place] [address]   Respiratory (~20 pathogens) panel by PCR     Status: None   Collection Time: 07/25/23  8:11 AM   Specimen: Nasopharyngeal Swab; Respiratory  Result Value Ref Range Status   Adenovirus NOT DETECTED NOT DETECTED Final   Coronavirus 229E NOT DETECTED NOT DETECTED Final    Comment: (NOTE) The Coronavirus on the Respiratory Panel, DOES NOT test for the novel  Coronavirus (2019 nCoV)    Coronavirus HKU1 NOT DETECTED NOT DETECTED Final   Coronavirus NL63 NOT DETECTED NOT DETECTED Final   Coronavirus OC43 NOT DETECTED NOT DETECTED Final   Metapneumovirus NOT DETECTED NOT DETECTED Final   Rhinovirus / Enterovirus NOT DETECTED NOT DETECTED Final   Influenza A NOT DETECTED NOT DETECTED Final   Influenza B NOT DETECTED NOT DETECTED Final   Parainfluenza Virus 1 NOT DETECTED NOT DETECTED Final   Parainfluenza Virus 2 NOT DETECTED NOT DETECTED Final   Parainfluenza Virus 3 NOT DETECTED NOT DETECTED Final   Parainfluenza Virus 4 NOT DETECTED NOT DETECTED Final   Respiratory Syncytial Virus NOT DETECTED NOT DETECTED Final   Bordetella pertussis  NOT DETECTED NOT DETECTED Final   Bordetella Parapertussis NOT DETECTED NOT DETECTED Final   Chlamydophila pneumoniae NOT DETECTED NOT DETECTED Final   Mycoplasma pneumoniae NOT DETECTED NOT DETECTED Final    Comment: Performed at [facility], [address], [place], [place] [address]  MRSA Next Gen by PCR, Nasal     Status: None   Collection Time: 07/26/23 11:23 AM   Specimen: Nasal Mucosa; Nasal Swab  Result Value Ref Range Status   MRSA by PCR Next Gen NOT DETECTED NOT DETECTED Final    Comment: (NOTE) The GeneXpert MRSA Assay (FDA approved for NASAL specimens only), is one component of a comprehensive MRSA colonization surveillance program. It is not intended to diagnose MRSA infection nor to guide or monitor treatment for MRSA infections. Test performance is not FDA approved in patients less than 8 years old. Performed at [facility], [address], [place], [place] [address]    *Note: Due to a large number of results and/or encounters for the requested time period, some results have not been displayed. A complete set of results can be found in Results Review.    Labs: CBC: Recent Labs  Lab 07/24/23 2323 07/26/23 0516 07/27/23 0451  WBC 12.1* 13.7* 14.0*  NEUTROABS 9.8*  --   --   HGB 9.5* 8.7* 8.4*  HCT 30.6* 27.3* 27.2*  MCV 105.5* 103.0* 103.4*  PLT 341 324 341   Basic Metabolic Panel: Recent Labs  Lab 07/24/23 2323 07/26/23 0516 07/27/23 0451  NA 140 133* 136  K 4.6 3.9 3.6  CL 103 103 100  CO2 22 22 23   GLUCOSE 107* 108* 82  BUN 30* 36* 34*  CREATININE 1.42* 1.37* 1.47*  CALCIUM 9.0 7.8* 7.8*  MG  --  2.2 2.4   Liver Function Tests: Recent Labs  Lab 07/24/23 2323  AST 35  ALT 42  ALKPHOS 139*  BILITOT 0.8  PROT 6.8  ALBUMIN  3.0*   CBG: No results for input(s): "GLUCAP" in the last 168 hours.  Discharge time spent: greater than 30 minutes.  Signed: Demaris Fillers, MD [facility] 07/27/2023

## 2023-07-27 NOTE — Plan of Care (Signed)
  Problem: Education: Goal: Knowledge of General Education information will improve Description: Including pain rating scale, medication(s)/side effects and non-pharmacologic comfort measures Outcome: Progressing   Problem: Health Behavior/Discharge Planning: Goal: Ability to manage health-related needs will improve Outcome: Progressing   Problem: Clinical Measurements: Goal: Ability to maintain clinical measurements within normal limits will improve Outcome: Progressing Goal: Will remain free from infection Outcome: Progressing Goal: Diagnostic test results will improve Outcome: Progressing Goal: Respiratory complications will improve Outcome: Progressing Goal: Cardiovascular complication will be avoided Outcome: Progressing   Problem: Activity: Goal: Risk for activity intolerance will decrease Outcome: Progressing   Problem: Pain Managment: Goal: General experience of comfort will improve and/or be controlled Outcome: Progressing   Problem: Safety: Goal: Ability to remain free from injury will improve Outcome: Progressing

## 2023-07-27 NOTE — Progress Notes (Signed)
   07/27/23 1143  TOC Discharge Assessment  Final next level of care Home w Home Health Services  Once discharged, how will the patient get to their discharge location? Family/Friend - Partnered Transport  Has discharge transport plan been identified? Yes  Barriers to Discharge No Barriers Identified  DME Arranged Oxygen  DME Agency [facility]  HH Arranged RN  [facility] Agency [facility]  Date [facility] Agency Contacted 07/27/23  [facility] Agency Contacted [phone]  Representative spoke with at [facility] Agency Active with [facility]   Patient discharge to home with HHRN and DME oxygen 2/[facility] is DME provider.

## 2023-07-27 NOTE — Progress Notes (Signed)
SATURATION QUALIFICATIONS: (This note is used to comply with regulatory documentation for home oxygen)   Patient Saturations on Room Air at Rest = 87   Patient Saturations on Room Air while Ambulating = N/A   Patient Saturations on 2 Liters of oxygen while Ambulating = 93   Please briefly explain why patient needs home oxygen: To maintain 02 sat at 90% or above during ambulation.   Orson Eva, DO

## 2023-07-30 ENCOUNTER — Encounter: Payer: Self-pay | Admitting: Podiatry

## 2023-07-30 ENCOUNTER — Ambulatory Visit (INDEPENDENT_AMBULATORY_CARE_PROVIDER_SITE_OTHER): Admitting: Podiatry

## 2023-07-30 DIAGNOSIS — L97312 Non-pressure chronic ulcer of right ankle with fat layer exposed: Secondary | ICD-10-CM

## 2023-07-30 DIAGNOSIS — Z9889 Other specified postprocedural states: Secondary | ICD-10-CM | POA: Diagnosis not present

## 2023-07-30 NOTE — Progress Notes (Signed)
  Subjective:  Patient ID: [name], female    DOB: 01/18/1941,  MRN: [id number]  Chief Complaint  Patient presents with   Wound Check    RM21: F/U: Ankle ulcer, right, with fat layer exposed Healing slow but well    DOS: 03/15/2023 Procedure: Right ankle ulcer debridement with prep for graft, bone biopsies distal fibula, application of arthroflex dermal allograft  83 y.o. female returns for post-op check.  She reports she is doing well wound seems to be healing and have been doing mupirocin  and nonadherent gauze dressing changes daily since last appointment.  Reports wound is doing well no concerns  Review of Systems: Negative except as noted in the HPI. Denies N/V/F/Ch.   Objective:  There were no vitals filed for this visit. There is no height or weight on file to calculate BMI. Constitutional Well developed. Well nourished.  Vascular Foot warm and well perfused. Capillary refill normal to all digits.  Calf is soft and supple, no posterior calf or knee pain, negative Homans' sign  Neurologic Normal speech. Oriented to person, place, and time. Epicritic sensation to light touch grossly present bilaterally.  Dermatologic Ulceration the right lateral ankle is shrinking with complete healing of the majority of the wound on the lateral side there is 1 small area that does probe slightly deeper and is circular at the proximal aspect of the ulceration.  Question if there is probe to bone/lateral fibula at this area.  The medial ulcers appear fully healed      Orthopedic: Tenderness to palpation noted about the surgical site.   Multiple view plain film radiographs: Deferred   Path: Ulceration sent for pathology showed ulceration with acute inflammation and granulation tissue.  No specific diagnosis given as to any type of abnormal ulceration.  Bone biopsy right ankle distal fibula resulted with benign bone and connective tissue no inflammatory changes were identified.  Culture:  Corynebacterium stratum, group A strep Assessment:   1. Ankle ulcer, right, with fat layer exposed (HCC)   2. Post-operative state         Plan:  Patient was evaluated and treated and all questions answered.  S/p foot surgery right ankle ulcer debridement with prep for graft fibular bone biopsy and dermal allograft application -Progressing as expected post-operatively wound is healing at this time, nearly fully healed laterally with 1 small area remaining.  The medial ulcers are fully healed -Continue local wound care-we will continue with piercing ointment to the wound bed and then cover with adhesive dressing -Apply every second or third day and cover with nonhearing gauze gauze wrap Ace wrap -XR: Deferred -WB Status: Weightbearing as tolerated in regular shoe -Sutures: None present -Medications: No indication for antibiotics at this time -Foot redressed with antibiotic ointment dressing.         Maridee Shoemaker, DPM [facility] / [facility]

## 2023-08-01 ENCOUNTER — Other Ambulatory Visit: Payer: Self-pay | Admitting: Cardiovascular Disease

## 2023-08-08 ENCOUNTER — Telehealth: Payer: Self-pay

## 2023-08-08 ENCOUNTER — Other Ambulatory Visit: Payer: Self-pay

## 2023-08-08 DIAGNOSIS — I480 Paroxysmal atrial fibrillation: Secondary | ICD-10-CM

## 2023-08-08 MED ORDER — APIXABAN 2.5 MG PO TABS: 2.5000 mg | ORAL_TABLET | Freq: Two times a day (BID) | ORAL | 1 refills | Status: DC

## 2023-08-08 MED ORDER — INCRUSE ELLIPTA 62.5 MCG/ACT IN AEPB: 1.0000 | INHALATION_SPRAY | Freq: Every day | RESPIRATORY_TRACT | 3 refills | Status: DC

## 2023-08-08 NOTE — Telephone Encounter (Signed)
 Copied from CRM [id number]. Topic: Clinical - Prescription Issue >> Aug 08, 2023  9:06 AM Margarette Shawl wrote: Reason for CRM:   Pt is calling to refill umeclidinium bromide  (INCRUSE ELLIPTA ) 62.5 MCG/ACT AEPB . She contacted [facility] and was advised they have no refills avail for the medication. Advised she has 3 refills according to her chart. Requested if [facility] could be contacted, or refills sent over. She is completley out of medication.   CB#  [phone]  Refill sent to pharmacy. Pt is aware. Nothing further needed

## 2023-08-08 NOTE — Telephone Encounter (Signed)
 Prescription refill request for Eliquis  received. Indication: Afib  Last office visit: 03/04/23 [name])  Scr: 1.47 (07/27/23)  Age: 83 Weight: 42.3kg  Appropriate dose. Refill sent.

## 2023-08-13 LAB — LAB REPORT - SCANNED: EGFR: 33

## 2023-08-14 ENCOUNTER — Telehealth: Payer: Self-pay | Admitting: Cardiovascular Disease

## 2023-08-14 DIAGNOSIS — I480 Paroxysmal atrial fibrillation: Secondary | ICD-10-CM

## 2023-08-14 MED ORDER — APIXABAN 2.5 MG PO TABS
2.5000 mg | ORAL_TABLET | Freq: Two times a day (BID) | ORAL | 1 refills | Status: AC
Start: 2023-08-14 — End: ?

## 2023-08-14 NOTE — Telephone Encounter (Signed)
*  STAT* If patient is at the pharmacy, call can be transferred to refill team.   1. Which medications need to be refilled? (please list name of each medication and dose if known) apixaban (ELIQUIS) 2.5 MG TABS tablet   2. Which pharmacy/location (including street and city if local pharmacy) is medication to be sent to? [facility] - [place], [place] - [address] AT Portal to Registered [facility] Sites   3. Do they need a 30 day or 90 day supply? 90  

## 2023-08-14 NOTE — Telephone Encounter (Signed)
 Prescription refill request for Eliquis  received. Indication: Afib  Last office visit: 03/04/23 [name])  Scr: 1.47 (07/27/23)  Age: 83 Weight: 42.3kg  Appropriate dose. Refill sent.

## 2023-08-15 ENCOUNTER — Encounter (HOSPITAL_COMMUNITY)

## 2023-08-19 ENCOUNTER — Encounter

## 2023-08-20 ENCOUNTER — Ambulatory Visit (INDEPENDENT_AMBULATORY_CARE_PROVIDER_SITE_OTHER): Admitting: Podiatry

## 2023-08-20 ENCOUNTER — Other Ambulatory Visit: Payer: Self-pay

## 2023-08-20 DIAGNOSIS — Z9889 Other specified postprocedural states: Secondary | ICD-10-CM | POA: Diagnosis not present

## 2023-08-20 DIAGNOSIS — L97312 Non-pressure chronic ulcer of right ankle with fat layer exposed: Secondary | ICD-10-CM

## 2023-08-20 MED ORDER — PRAVASTATIN SODIUM 40 MG PO TABS: 40.0000 mg | ORAL_TABLET | Freq: Every day | ORAL | 1 refills | Status: DC

## 2023-08-20 NOTE — Progress Notes (Signed)
  Subjective:  Patient ID: [name], female    DOB: [birth year],  MRN: [id number]  Chief Complaint  Patient presents with   Ankle Ulcer    Right ankle. Patient feels that she is doing wonderful. No pain, no drainage. Notices some healing. Her husband is with today.     DOS: 03/15/2023 Procedure: Right ankle ulcer debridement with prep for graft, bone biopsies distal fibula, application of arthroflex dermal allograft  83 y.o. female returns for post-op check.  Reports the wound continues to be improving with local wound care.  Has ongoing wound care with mupirocin  ointment for her husband.  Review of Systems: Negative except as noted in the HPI. Denies N/V/F/Ch.   Objective:  There were no vitals filed for this visit. There is no height or weight on file to calculate BMI. Constitutional Well developed. Well nourished.  Vascular Foot warm and well perfused. Capillary refill normal to all digits.  Calf is soft and supple, no posterior calf or knee pain, negative Homans' sign  Neurologic Normal speech. Oriented to person, place, and time. Epicritic sensation to light touch grossly present bilaterally.  Dermatologic Ulceration the right lateral ankle is shrinking with complete healing of the majority of the wound on the lateral side there is 1 small area that does probe slightly deeper and is circular at the proximal aspect of the ulceration.   The medial ulcers appear fully healed    Orthopedic: Tenderness to palpation noted about the surgical site.   Multiple view plain film radiographs: Deferred   Path: Ulceration sent for pathology showed ulceration with acute inflammation and granulation tissue.  No specific diagnosis given as to any type of abnormal ulceration.  Bone biopsy right ankle distal fibula resulted with benign bone and connective tissue no inflammatory changes were identified.  Culture: Corynebacterium stratum, group A strep Assessment:   1. Ankle ulcer, right,  with fat layer exposed (HCC)   2. Post-operative state     Plan:  Patient was evaluated and treated and all questions answered.  S/p foot surgery right ankle ulcer debridement with prep for graft fibular bone biopsy and dermal allograft application -Progressing as expected post-operatively wound is healing at this time, nearly fully healed laterally with 1 small area remaining.  The medial ulcers are fully healed -Continue local wound care-we will continue with piercing ointment to the wound bed and then cover with adhesive dressing -Apply every second or third day and cover with nonhearing gauze gauze wrap Ace wrap -XR: Deferred -WB Status: Weightbearing as tolerated in regular shoe -Sutures: None present -Medications: No indication for antibiotics at this time -Foot redressed with antibiotic ointment dressing.         Maridee Shoemaker, DPM [facility] / [facility]

## 2023-08-22 ENCOUNTER — Ambulatory Visit (HOSPITAL_COMMUNITY)
Admission: RE | Admit: 2023-08-22 | Discharge: 2023-08-22 | Disposition: A | Discharge status: Home / Self Care | Source: Ambulatory Visit | Attending: Surgery | Admitting: Surgery

## 2023-08-22 DIAGNOSIS — I7025 Atherosclerosis of native arteries of other extremities with ulceration: Secondary | ICD-10-CM

## 2023-08-23 NOTE — Progress Notes (Signed)
 Patient ID: [name], female   DOB: [birth year], 83 y.o.   MRN: [id number]     83 y.o. 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 She has been asymptomatic and last saw him 05/01/21 LICA 50-69% duplex 03/16/21 GI bleed 02/20/15 transfused 4 units INR over 10 coumadin  changed to eliquis . Had gastric and duodenal ulcers. Chronic lung disease with necrotizing pneumonia moderate COPD sees Ramaschawmy from pulmonary   Still issues with falling Using rolling wheel chair now  Feels her UE tremors are worse  Seen by Dr Tat 12/12/17 ? Decrease dose of amiodarone  furtherOr change eliquis  to pradaxa and try primidone  Also noted left foot drop ? From lumbar radiculopathy  Told not to start Evinity for osteoporosis due to increased risk of MI/Stroke Dr Del Favia suggested Getting Prolia  now   Avapro  d/c 05/31/20 for elevated K/Cr  1.23/54. Normalized on labs 10/24/20 Primary wanted to start Lisinopril  for proteinuria but not a great idea given above and age   Hospitalized for sepsis 9/17-26/2023 left foot cellulitis with amputation of left great toe She sees Clark/Brabham  VVS stent to Left SFA to improve inflow with popliteal angioplasty  May 2024 had lser atherectomy of right anterior tibial for non healing right ulcer ABI 0.87 Due to have angiogram with ? Right SFA intervention   TTE 05/24/22 EF 40-45% mild/mod MR mild AR AV sclerosis nl RV  Hospitalized [facility] 06/18/22 with sepsis from pneumonia and volume overload with CHF and EF newly decreased 40-45% by TTE see above ECG with atypical flutter Diuresed and no attempts at DCC On low dose eliquis  due to age, weight and CRF Cr 1.43 BNP 2405  She continues to be very tenuous. Back on amiodarone  for flutter  Has f/u with Dr Winferd Hatter  Dorris Gaul to use primidone  due to DOAC   She had intervention by Brabhm left SFA 07/16/23 Prior to that has sepsis with right ankle cellulitis RX with IV vancomycin  for 2 weeks and 4  weeks of linezolid . She was admitted with dyspnea 4/16-19. Hb 9.5 BNP 3972. CT with RML pneumonia and increased mediastinal LN. Rx with antibiotics , iv lasix  and d/c home on 2L's oxygen Echo with stable EF 45% 07/25/23 D/c with torsemide  40 mg daily instead of same dose Lasix   She is DNR    ROS: Denies fever, malais, weight loss, blurry vision, decreased visual acuity, cough, sputum, SOB, hemoptysis, pleuritic pain, palpitaitons, heartburn, abdominal pain, melena, lower extremity edema, claudication, or rash.  All other systems reviewed and negative   General: BP (!) 170/72   Pulse 79   Ht 5\' 2"  (1.575 m)   Wt 92 lb (41.7 kg)   SpO2 94%   BMI 16.83 kg/m  Affect appropriate Frail thin chronically ill white female  HEENT: normal Neck supple with no adenopathy JVP normal bilateral carotid and subclavian  bruits no thyromegaly Lungs clear with no wheezing and good diaphragmatic motion Heart:  S1/S2 no murmur, no rub, gallop or click PMI normal post left mastectomy  Abdomen: benighn, BS positve, no tenderness, no AAA no bruit.  No HSM or HJR Decreased pulse and BP in right arm  No edema Neuro UE tremors left foot drop  Skin bruising over arms  Post recent left great toe amputation non healing ulcer right lateral malleolus with chronic ischemic changes right foot      Current Outpatient Medications  Medication Sig Dispense Refill   acetaminophen  (TYLENOL ) 325 MG tablet Take  2 tablets (650 mg total) by mouth every 6 (six) hours as needed for mild pain (pain score 1-3) (or Fever >/= 101). 30 tablet 0   albuterol  (VENTOLIN  HFA) 108 (90 Base) MCG/ACT inhaler Inhale 2 puffs into the lungs every 6 (six) hours as needed for wheezing or shortness of breath. 18 g 1   AMBULATORY NON FORMULARY MEDICATION Dispense one Readi Steadi glove 1 Units 0   amiodarone  (PACERONE ) 200 MG tablet Take 0.5 tablets (100 mg total) by mouth every other day. (Patient taking differently: Take 200 mg by mouth  daily.)     apixaban  (ELIQUIS ) 2.5 MG TABS tablet Take 1 tablet (2.5 mg total) by mouth 2 (two) times daily. 180 tablet 1   ascorbic acid  (VITAMIN C ) 500 MG tablet Take 500 mg by mouth in the morning.     CALCIUM PO Take 1,200 mg by mouth 2 (two) times daily. 600 mg each     Camphor-Menthol-Methyl Sal (SALONPAS) 3.04-14-08 % PTCH Place 1 patch onto the skin daily as needed (pain.).     clopidogrel  (PLAVIX ) 75 MG tablet TAKE 1 TABLET EVERY MORNING 90 tablet 2   denosumab  (PROLIA ) 60 MG/ML SOSY injection Inject 60 mg into the skin every 6 (six) months.     Ferrous Sulfate  (IRON PO) Take 65 mg by mouth at bedtime.     levothyroxine (SYNTHROID) 50 MCG tablet Take 50 mcg by mouth daily before breakfast.     MAGNESIUM  OXIDE PO Take 500 mg by mouth every morning.     metoprolol  tartrate (LOPRESSOR ) 25 MG tablet Take 3 tablets (75 mg total) by mouth 2 (two) times daily. 540 tablet 1   Multiple Vitamin (MULTIVITAMIN WITH MINERALS) TABS tablet Take 1 tablet by mouth every morning.     mupirocin  ointment (BACTROBAN ) 2 % Apply 1 Application topically daily. 22 g 0   pantoprazole  (PROTONIX ) 40 MG tablet TAKE 1 TABLET DAILY 90 tablet 3   potassium chloride  (KLOR-CON  M) 10 MEQ tablet Take 2 tablets (20 mEq total) by mouth daily. (Patient taking differently: Take 10 mEq by mouth daily.) 120 tablet 3   pravastatin  (PRAVACHOL ) 40 MG tablet Take 1 tablet (40 mg total) by mouth daily. 90 tablet 1   torsemide  (DEMADEX ) 20 MG tablet Take 2 tablets (40 mg total) by mouth daily. 60 tablet 1   umeclidinium bromide  (INCRUSE ELLIPTA ) 62.5 MCG/ACT AEPB Inhale 1 puff into the lungs daily. 90 each 3   No current facility-administered medications for this visit.    Allergies  Iodine , Iohexol , Petroleum gauze non-woven 3x9" [wound dressings], Sulfa  antibiotics, Wound dressing adhesive, and Tape  Electrocardiogram:  11/18/19 Afib rate 100 nonspecific ST changes   Assessment and Plan Vascular: right innominate occlusion  with moderate LICA stenosis f/u duplex ordered  f/u Brabham 1/61/09 40-59% LICA stenosis   PAF:  Continue eliquis  lower to 2.5 bid if any further bleeding issues She has had more recent refractory flutter She is not a candidate for Multaq due to CHF and decreased EF and CAD not a candidate for flecainide or propofonone. Renal failure precludes any reasonable dose of Tikosyn On amiodarone   lower dose due to tremors   Chol:  On statin. mevacor increased last visit LDL 90 10/24/20    HTN:  stable off avapro    Pulmonary:  Quit smoking 12 years ago Gold Class 2 COPD f/u pulmonary Bronchiectasis and frequent pneumonia as well On oxygen 2L's by Dickinson  Edema:  improved continue diuretic   Anemia :  History of gastric ulcers continue protonix  f/u GI  Lab Results  Component Value Date   HCT 27.2 (L) 07/27/2023   CAD:  No chest pain extensive by CT last cath 2011 with no hemodynamically significant disease Continue plavix  not on ASA due to GI bleed and need for eliquis  for PAF  Tremor:  worse on amiodarone  Sees Tat Neurology unable to use primidone  due to eliquis    Vascular:  f/u Brabham left great toe amputation with SFA stenting and Popliteal balloon. Non healing right ulcer for intervention to right SFA 11/19 with Dr Fulton Job   DCM:  new onset decreased EF 40-45% by TTE 05/24/22 likely worse due to atypical flutter with limited no good options for AAT. Continue lopressor  with recent echo showing EF 45% and diuretic changed to demedex from lasix     F/u with me in 6 months     Janelle Mediate

## 2023-08-29 ENCOUNTER — Other Ambulatory Visit: Payer: Self-pay

## 2023-08-29 MED ORDER — METOPROLOL TARTRATE 25 MG PO TABS: 75.0000 mg | ORAL_TABLET | Freq: Two times a day (BID) | ORAL | 1 refills | Status: DC

## 2023-08-30 ENCOUNTER — Encounter: Payer: Self-pay | Admitting: Cardiovascular Disease

## 2023-08-30 ENCOUNTER — Ambulatory Visit: Payer: Medicare HMO | Attending: Cardiovascular Disease | Admitting: Cardiovascular Disease

## 2023-08-30 VITALS — BP 170/72 | HR 79 | Ht 62.0 in | Wt 92.0 lb

## 2023-08-30 DIAGNOSIS — I739 Peripheral vascular disease, unspecified: Secondary | ICD-10-CM

## 2023-08-30 DIAGNOSIS — I70222 Atherosclerosis of native arteries of extremities with rest pain, left leg: Secondary | ICD-10-CM

## 2023-08-30 DIAGNOSIS — I5022 Chronic systolic (congestive) heart failure: Secondary | ICD-10-CM

## 2023-08-30 DIAGNOSIS — J449 Chronic obstructive pulmonary disease, unspecified: Secondary | ICD-10-CM | POA: Diagnosis not present

## 2023-08-30 DIAGNOSIS — I480 Paroxysmal atrial fibrillation: Secondary | ICD-10-CM | POA: Diagnosis not present

## 2023-08-30 NOTE — Patient Instructions (Addendum)
 Medication Instructions:  Your physician recommends that you continue on your current medications as directed. Please refer to the Current Medication list given to you today.  *If you need a refill on your cardiac medications before your next appointment, please call your pharmacy*  Lab Work: If you have labs (blood work) drawn today and your tests are completely normal, you will receive your results only by: MyChart Message (if you have MyChart) OR A paper copy in the mail If you have any lab test that is abnormal or we need to change your treatment, we will call you to review the results.  Testing/Procedures: None ordered today.  Follow-Up: At [facility], you and your health needs are our priority.  As part of our continuing mission to provide you with exceptional heart care, our providers are all part of one team.  This team includes your primary Cardiologist (physician) and Advanced Practice Providers or APPs (Physician Assistants and Nurse Practitioners) who all work together to provide you with the care you need, when you need it.  Your next appointment:   3 month(s)  Provider:   Janelle Mediate, MD    We recommend signing up for the patient portal called "MyChart".  Sign up information is provided on this After Visit Summary.  MyChart is used to connect with patients for Virtual Visits (Telemedicine).  Patients are able to view lab/test results, encounter notes, upcoming appointments, etc.  Non-urgent messages can be sent to your provider as well.   To learn more about what you can do with MyChart, go to [email or web address].

## 2023-09-02 NOTE — Progress Notes (Unsigned)
 Office Note     CC:  follow up Requesting Provider:  Omie Bickers, MD  HPI: [name] is a 83 y.o. (1940/11/11) female who presents for surveillance of PAD.  She is status post aortogram with bilateral lower extremity runoff and laser atherectomy with drug-coated balloon angioplasty of left SFA in-stent stenosis on 07/16/2023 by Dr. Charlotte Cookey.  She denies any ongoing pain from the right groin access site.  She is well-known to [facility] with numerous interventions in the past including:   12/19/2021: Left superficial femoral artery stent, DCB, left popliteal artery, angioplasty, left peroneal artery (critical limb ischemia) 12/29/2021: Left great toe amputation, Dr. Lydia Sams 08/28/2022: Laser atherectomy with angioplasty, left popliteal and left anterior tibial artery (left leg ulcer) 10/16/2022: Laser atherectomy with angioplasty, right anterior tibial artery (ulcer) 02/26/2023: Laser atherectomy and DCB to the right SFA.  Angioplasty, right anterior tibial and right peroneal artery 03/15/2023: I&D right ankle ulcer with allograft (Dr. Rosemarie Conquest)  She continues to care for her chronic right ankle wound.  She had been see her Podiatrist every three weeks but believes he will "release" her soon because of how much progress she has made.  She denies any claudication, rest pain, or further tissue loss.  She is on Eliquis  for atrial fibrillation.  She is on a daily statin.  She is a former smoker.   Past Medical History:  Diagnosis Date   Anemia 05/2014   Atrial fibrillation (HCC)    x 3 yrs   Breast cancer (HCC)    S/P left mastectomy and chemotherapy 1989 remained in remission   Carotid artery occlusion    Carotid bruit    LICA 60-79% (duplex 9/11)   Degenerative arthritis of spine 2015   Chronic back pain   Hyperlipidemia    Hypertension    Meningioma (HCC)    Osteoporosis    Peripheral arterial disease (HCC)    Pneumonia 05/19/2014   Stroke (HCC)    CVA 2008   Subclavian steal syndrome     Ulcers of both lower extremities (HCC) 2015    Past Surgical History:  Procedure Laterality Date   ABDOMINAL AORTOGRAM W/LOWER EXTREMITY N/A 12/19/2021   Procedure: ABDOMINAL AORTOGRAM W/LOWER EXTREMITY;  Surgeon: Margherita Shell, MD;  Location: [facility];  Service: Cardiovascular;  Laterality: N/A;   ABDOMINAL AORTOGRAM W/LOWER EXTREMITY Bilateral 08/28/2022   Procedure: ABDOMINAL AORTOGRAM W/LOWER EXTREMITY;  Surgeon: Margherita Shell, MD;  Location: [facility];  Service: Cardiovascular;  Laterality: Bilateral;   ABDOMINAL AORTOGRAM W/LOWER EXTREMITY N/A 10/16/2022   Procedure: ABDOMINAL AORTOGRAM W/LOWER EXTREMITY;  Surgeon: Margherita Shell, MD;  [facility]: [facility];  Service: Cardiovascular;  Laterality: N/A;   ABDOMINAL AORTOGRAM W/LOWER EXTREMITY N/A 02/26/2023   Procedure: ABDOMINAL AORTOGRAM W/LOWER EXTREMITY;  Surgeon: Margherita Shell, MD;  [facility]: [facility];  Service: Cardiovascular;  Laterality: N/A;   ABDOMINAL AORTOGRAM W/LOWER EXTREMITY N/A 07/16/2023   Procedure: ABDOMINAL AORTOGRAM W/LOWER EXTREMITY;  Surgeon: Margherita Shell, MD;  [facility]: [facility];  Service: Cardiovascular;  Laterality: N/A;   AMPUTATION TOE Left 12/29/2021   Procedure: Incisional DEBRIDEMENT Left Foot with Amputation Left Great Toe;  Surgeon: Velma Ghazi, DPM;  Location: [facility];  Service: Podiatry;  Laterality: Left;   CARDIAC CATHETERIZATION     CATARACT EXTRACTION Bilateral 2013   COLONOSCOPY  11/17/2010   Procedure: COLONOSCOPY;  Surgeon: Ruby Corporal, MD;  Location: [facility];  Service: Endoscopy;  Laterality: N/A;  10:45 am   ESOPHAGOGASTRODUODENOSCOPY  11/17/2010   Procedure: ESOPHAGOGASTRODUODENOSCOPY (EGD);  Surgeon: Ruby Corporal, MD;  [facility]: [facility];  Service: Endoscopy;  Laterality: N/A;   ESOPHAGOGASTRODUODENOSCOPY N/A 02/16/2015   Procedure: ESOPHAGOGASTRODUODENOSCOPY (EGD);  Surgeon: Danette Duos, MD;  Location: [facility];  Service: Gastroenterology;  Laterality: N/A;   EXPLORATORY LAPAROTOMY  1960s   For peritonitis of undetermined cause   EYE SURGERY     INCISION AND DRAINAGE OF WOUND Right 03/15/2023   Procedure: IRRIGATION AND DEBRIDEMENT WOUND;  Surgeon: Evertt Hoe, DPM;  Location: [facility];  Service: Orthopedics/Podiatry;  Laterality: Right;  Right ankle ulcer debridement, bone biopsy, graft application   LOWER EXTREMITY ANGIOGRAPHY N/A 07/16/2023   Procedure: Lower Extremity Angiography;  Surgeon: Margherita Shell, MD;  [facility]: [facility];  Service: Cardiovascular;  Laterality: N/A;   LOWER EXTREMITY INTERVENTION Left 07/16/2023   Procedure: LOWER EXTREMITY INTERVENTION;  Surgeon: Margherita Shell, MD;  [facility];  Service: Cardiovascular;  Laterality: Left;  SFA laser   LUMBAR EPIDURAL INJECTION  06-2012--06-2013   pt. states she has had 5 epidurals in 06-2012----06-2013   MASTECTOMY Left 1990   PERIPHERAL VASCULAR ATHERECTOMY  08/28/2022   Procedure: PERIPHERAL VASCULAR ATHERECTOMY;  Surgeon: Margherita Shell, MD;  [facility];  Service: Cardiovascular;;  Lt Popliteal and AT   PERIPHERAL VASCULAR ATHERECTOMY  02/26/2023   Procedure: PERIPHERAL VASCULAR ATHERECTOMY;  Surgeon: Margherita Shell, MD;  [facility];  Service: Cardiovascular;;   PERIPHERAL VASCULAR BALLOON ANGIOPLASTY  08/28/2022   Procedure: PERIPHERAL VASCULAR BALLOON ANGIOPLASTY;  Surgeon: Margherita Shell, MD;  [facility];  Service: Cardiovascular;;  Lt Popliteal and AT   PERIPHERAL VASCULAR BALLOON ANGIOPLASTY  10/16/2022   Procedure: PERIPHERAL VASCULAR BALLOON ANGIOPLASTY;  Surgeon: Margherita Shell, MD;  [facility];  Service: Cardiovascular;;  Rt AT   PERIPHERAL VASCULAR BALLOON ANGIOPLASTY  02/26/2023   Procedure: PERIPHERAL VASCULAR BALLOON ANGIOPLASTY;  Surgeon: Margherita Shell, MD;  [facility];  Service:  Cardiovascular;;   PERIPHERAL VASCULAR INTERVENTION Left 12/19/2021   Procedure: PERIPHERAL VASCULAR INTERVENTION;  Surgeon: Margherita Shell, MD;  [facility];  Service: Cardiovascular;  Laterality: Left;  SFA and PTA of POP   YAG LASER APPLICATION Right 05/03/2015   Procedure: YAG LASER APPLICATION;  Surgeon: Albert Huff, MD;  Location: [facility];  Service: Ophthalmology;  Laterality: Right;  right   YAG LASER APPLICATION Left 05/24/2015   Procedure: YAG LASER APPLICATION;  Surgeon: Albert Huff, MD;  Location: [facility];  Service: Ophthalmology;  Laterality: Left;    Social History   Socioeconomic History   Marital status: Married    Spouse name: Not on file   Number of children: 2   Years of education: Not on file   Highest education level: Not on file  Occupational History   Occupation: Retired    Associate Professor: RETIRED  Tobacco Use   Smoking status: Former    Current packs/day: 0.00    Average packs/day: 1 pack/day for 56.0 years (56.0 ttl pk-yrs)    Types: Cigarettes    Start date: 08/14/1950    Quit date: 08/14/2006    Years since quitting: 17.0    Passive exposure: Never   Smokeless tobacco: Never   Tobacco comments:    quit in 2008  Vaping Use   Vaping status: Never Used  Substance and Sexual Activity   Alcohol use: No    Alcohol/week: 0.0 standard drinks of alcohol   Drug use: No   Sexual activity: Never  Other Topics Concern   Not on file  Social History Narrative   Right handed   Social Drivers of Health   Financial Resource Strain: Not on file  Food Insecurity: No Food Insecurity (07/25/2023)   Hunger Vital Sign    Worried About Running Out of Food in the Last Year: Never true    Ran Out of Food in the Last Year: Never true  Transportation Needs: No Transportation Needs (07/25/2023)   PRAPARE - Administrator, Civil Service (Medical): No    Lack of Transportation (Non-Medical): No  Physical Activity: Not on file  Stress: Not on file   Social Connections: Unknown (07/25/2023)   Social Connection and Isolation Panel [NHANES]    Frequency of Communication with Friends and Family: Patient declined    Frequency of Social Gatherings with Friends and Family: Patient declined    Attends Religious Services: Not on Insurance claims handler of Clubs or Organizations: Not on file    Attends Banker Meetings: Patient declined    Marital Status: Married  Catering manager Violence: Not At Risk (07/25/2023)   Humiliation, Afraid, Rape, and Kick questionnaire    Fear of Current or Ex-Partner: No    Emotionally Abused: No    Physically Abused: No    Sexually Abused: No    Family History  Problem Relation Age of Onset   Alzheimer's disease Mother    Dementia Mother    Hypertension Mother    Hyperlipidemia Mother    Parkinsonism Father     Current Outpatient Medications  Medication Sig Dispense Refill   acetaminophen  (TYLENOL ) 325 MG tablet Take 2 tablets (650 mg total) by mouth every 6 (six) hours as needed for mild pain (pain score 1-3) (or Fever >/= 101). 30 tablet 0   albuterol  (VENTOLIN  HFA) 108 (90 Base) MCG/ACT inhaler Inhale 2 puffs into the lungs every 6 (six) hours as needed for wheezing or shortness of breath. 18 g 1   AMBULATORY NON FORMULARY MEDICATION Dispense one Readi Steadi glove 1 Units 0   amiodarone  (PACERONE ) 200 MG tablet Take 0.5 tablets (100 mg total) by mouth every other day. (Patient taking differently: Take 200 mg by mouth daily.)     apixaban  (ELIQUIS ) 2.5 MG TABS tablet Take 1 tablet (2.5 mg total) by mouth 2 (two) times daily. 180 tablet 1   ascorbic acid  (VITAMIN C ) 500 MG tablet Take 500 mg by mouth in the morning.     CALCIUM PO Take 1,200 mg by mouth 2 (two) times daily. 600 mg each     Camphor-Menthol-Methyl Sal (SALONPAS) 3.04-14-08 % PTCH Place 1 patch onto the skin daily as needed (pain.).     clopidogrel  (PLAVIX ) 75 MG tablet TAKE 1 TABLET EVERY MORNING 90 tablet 2   denosumab   (PROLIA ) 60 MG/ML SOSY injection Inject 60 mg into the skin every 6 (six) months.     Ferrous Sulfate  (IRON PO) Take 65 mg by mouth at bedtime.     levothyroxine (SYNTHROID) 50 MCG tablet Take 50 mcg by mouth daily before breakfast.     MAGNESIUM  OXIDE PO Take 500 mg by mouth every morning.     metoprolol  tartrate (LOPRESSOR ) 25 MG tablet Take 3 tablets (75 mg total) by mouth 2 (two) times daily. 540 tablet 1  Multiple Vitamin (MULTIVITAMIN WITH MINERALS) TABS tablet Take 1 tablet by mouth every morning.     mupirocin  ointment (BACTROBAN ) 2 % Apply 1 Application topically daily. 22 g 0   pantoprazole  (PROTONIX ) 40 MG tablet TAKE 1 TABLET DAILY 90 tablet 3   potassium chloride  (KLOR-CON  M) 10 MEQ tablet Take 2 tablets (20 mEq total) by mouth daily. (Patient taking differently: Take 10 mEq by mouth daily.) 120 tablet 3   pravastatin  (PRAVACHOL ) 40 MG tablet Take 1 tablet (40 mg total) by mouth daily. 90 tablet 1   torsemide  (DEMADEX ) 20 MG tablet Take 2 tablets (40 mg total) by mouth daily. 60 tablet 1   umeclidinium bromide  (INCRUSE ELLIPTA ) 62.5 MCG/ACT AEPB Inhale 1 puff into the lungs daily. 90 each 3   No current facility-administered medications for this visit.    Allergies  Allergen Reactions   Iodine  Rash and Other (See Comments)    BETADINE, blistering rash/burns   Iohexol  Rash and Other (See Comments)    Blisters; PT NEEDS 13-HOUR PREP    Petroleum Gauze Non-Woven 3x9" [Wound Dressings] Other (See Comments)    BURNING SENSATION, RED SKIN   Sulfa  Antibiotics Nausea And Vomiting   Wound Dressing Adhesive Other (See Comments)    Burns skin   Tape Itching, Rash and Other (See Comments)    "DO NOT USE ADHESIVE TAPE" Not even a Band Aid" NO PAPER TAPE Burns/tears skin Skin is very very thin     REVIEW OF SYSTEMS:   [X]  denotes positive finding, [ ]  denotes negative finding Cardiac  Comments:  Chest pain or chest pressure:    Shortness of breath upon exertion:    Short of  breath when lying flat:    Irregular heart rhythm:        Vascular    Pain in calf, thigh, or hip brought on by ambulation:    Pain in feet at night that wakes you up from your sleep:     Blood clot in your veins:    Leg swelling:         Pulmonary    Oxygen at home:    Productive cough:     Wheezing:         Neurologic    Sudden weakness in arms or legs:     Sudden numbness in arms or legs:     Sudden onset of difficulty speaking or slurred speech:    Temporary loss of vision in one eye:     Problems with dizziness:         Gastrointestinal    Blood in stool:     Vomited blood:         Genitourinary    Burning when urinating:     Blood in urine:        Psychiatric    Major depression:         Hematologic    Bleeding problems:    Problems with blood clotting too easily:        Skin    Rashes or ulcers:        Constitutional    Fever or chills:      PHYSICAL EXAMINATION:  Vitals:   09/03/23 0920  BP: (!) 184/66  Pulse: 74  Temp: 98 F (36.7 C)  TempSrc: Oral  SpO2: 92%  Weight: 92 lb (41.7 kg)  Height: 5\' 2"  (1.575 m)    General:  WDWN in NAD; vital signs documented above Gait: Not observed HENT: WNL,  normocephalic Pulmonary: normal non-labored breathing Cardiac: regular HR Abdomen: soft, NT, no masses Skin: without rashes Vascular Exam/Pulses: brisk ATA LLE by doppler Extremities: without ischemic changes, without Gangrene , without cellulitis; without open wounds; superficial wound R ankle without surrounding erythema or drainage;  Musculoskeletal: no muscle wasting or atrophy  Neurologic: A&O X 3 Psychiatric:  The pt has Normal affect.   Non-Invasive Vascular Imaging:   Left lower extremity arterial duplex demonstrates multiphasic flow through the left SFA stent.  She also has multiphasic flow throughout the posterior tibial artery as well as a widely patent anterior tibial artery    ASSESSMENT/PLAN:: 83 y.o. female status post bilateral  lower extremity angiogram with treatment of left SFA stent stenosis with laser atherectomy and drug-coated balloon angioplasty   Ms. Kepple is overall doing well after recent angiography.  Left SFA stent is now widely patent.  Right groin access site without hematoma.  Aortogram also demonstrated mild stenosis of the right SFA and several areas all less than 50%.  She has two-vessel runoff via the anterior tibial and peroneal to the foot.  She is considered optimized from a vascular surgery standpoint in the right lower extremity.  It is encouraging that her right ankle wound continues to improve.  Continue follow-up with podiatry and current wound care.  We will check a bilateral lower extremity arterial duplex and ABI in another 6 months.  She also asked us  to recheck her carotid duplex at that time.  Cordie Deters, PA-C [facility] [phone]

## 2023-09-03 ENCOUNTER — Ambulatory Visit (INDEPENDENT_AMBULATORY_CARE_PROVIDER_SITE_OTHER): Admitting: Physician Assistant

## 2023-09-03 VITALS — BP 184/66 | HR 74 | Temp 98.0°F | Ht 62.0 in | Wt 92.0 lb

## 2023-09-03 DIAGNOSIS — I7025 Atherosclerosis of native arteries of other extremities with ulceration: Secondary | ICD-10-CM | POA: Diagnosis not present

## 2023-09-03 DIAGNOSIS — L97519 Non-pressure chronic ulcer of other part of right foot with unspecified severity: Secondary | ICD-10-CM | POA: Diagnosis not present

## 2023-09-03 DIAGNOSIS — I739 Peripheral vascular disease, unspecified: Secondary | ICD-10-CM

## 2023-09-17 ENCOUNTER — Ambulatory Visit: Admitting: Internal Medicine

## 2023-09-17 ENCOUNTER — Encounter: Payer: Self-pay | Admitting: Internal Medicine

## 2023-09-17 ENCOUNTER — Ambulatory Visit: Admitting: Podiatry

## 2023-09-17 ENCOUNTER — Ambulatory Visit (INDEPENDENT_AMBULATORY_CARE_PROVIDER_SITE_OTHER): Admitting: Internal Medicine

## 2023-09-17 VITALS — BP 132/70 | HR 103 | Ht 62.0 in | Wt 94.0 lb

## 2023-09-17 DIAGNOSIS — J449 Chronic obstructive pulmonary disease, unspecified: Secondary | ICD-10-CM | POA: Diagnosis not present

## 2023-09-17 DIAGNOSIS — Z87891 Personal history of nicotine dependence: Secondary | ICD-10-CM | POA: Diagnosis not present

## 2023-09-17 DIAGNOSIS — R911 Solitary pulmonary nodule: Secondary | ICD-10-CM

## 2023-09-17 NOTE — Patient Instructions (Addendum)
 ICD-10-CM   1. COPD, moderate (HCC)  J44.9     2. Nodule of lower lobe of left lung  R91.1           COPD, moderate (HCC)  -Stable COPD  Plan -Continue INCRUSE daily with albuterol  as needed - high dose flu shot in fall - continue night o2  Lung nodule left lower lobe - cavitary  - new in mrch 2024 and persistent in May 2024. PET scan COLD in Jun 2024  WORSE size in April 2025 with concern for  cancer and potential spread to mediastinal adenopathy  - respect 0% desire for lung biopsy, radiation, chemo and even followup CT chest   Plan - expectant followup respecting your wishes   Follow-up - Dr. Bertrum Brodie or nurse practitioner in the next 6 months

## 2023-09-17 NOTE — Progress Notes (Signed)
 OV 11/22/2015  Chief Complaint  Patient presents with   Follow-up    Pt states that she tries to take the Sprivia Resp daily - sometimes she forgets. Denies current breathing issues. Pt states that she does not have to use the Albuterol  HFA.    83 year old female former smoker with significant neuromuscular disability due to prior stroke, spinal issues.    STUDIES:   CT chest (05/2014) Masslike area of consolidation RLL, r hilar and mediastinal LAN CT chest (06/2014) Significant improvement of RLL opacification, cavitary lesion persists suggestive of resolving PNA with necrosis.   CT chest (09/2014) Near complete collapse of the right lower lobe cavitary lesion with residual pleural-parenchymal thickening. New fine ground-glass nodules in the right middle lobe consistent with pulmonary infection. CT chest (02/15/2015) Recurrent to rounded masslike area of consolidation in the right lower lobe. Given that this area was present previously then resolved and has recurrent quickly, this is most compatible with rounded pneumonia. Small right pleural effusion with adjacent right lower lobe atelectasis or consolidation. Mildly enlarged precarinal lymph node, likely reactive. CT chest 04/13/15 >Interval improvement in masslike consolidation in the right lower lobe, without complete resolution, favoring resolving pneumonia. 2. Locule of air at the lateral base of the right hemi thorax may be within necrotic lung. Bronchopleural fistula cannot be definitively excluded. 3. Small, partially loculated right pleural effusion. 4. 4 mm left lower lobe nodule, stable.    PCP Lucendia Rusk, MD REferrd by dr Stann Earnest HPI  IOV 07/09/2014  Chief Complaint  Patient presents with   Pulmonary Consult    Pt referred by Dr. Stann Earnest fpr abnormal CT. Pt denies SOB, cough, and CP/tightness. Pt stated she went to hospital for pna on 05/20/14.     83 year old female with significant neuromuscular disability due to  prior stroke, spinal issues. Previous greater than 50 pack smoker. Was admitted around 05/20/2014 with significant right lower lobe pneumonia/huge masslike appearance on CT scan of the chest. Apparently she was extensively asymptomatic at this point in time. Treated with antibiotics. She had follow-up CT scan of the chest which shows significant improvement in the right lower lobe opacity but with a residual 5.2 cm thick-walled cavitary lesion. There for she's here for follow-up. At baseline she reports no dyspnea but then she is extremely disabled and is only able to move a little bit with pushing a wheelchair and this is because of a spinal issues. She only rarely feels short of breath and manages with pro-air when necessary. She's never had lung function test in the past. She denies any cough. Currently she feels better     OV 09/29/2014  Chief Complaint  Patient presents with   Follow-up    Pt here after CT scan. Pt states her breathing is unchanged since last OV. Pt has left ankle edema dt recent sprain, pt is seeing PCP for the issue.     Follow-up   - Right lower lobe lung cavity following pneumonia February 2016: She had CT scan of the chest June 2016. This showed near resolution of the cavity and it is down to a scar tissue. I personally visualized image dated 09/08/2014  - New issue: CT scan of the chest 09/08/2014 compared to CT scans of the chest March 2016 show some new right middle lobe groundglass nodules that are extremely small. She denies she was sick at that time  - Other issue: History of smoking with shortness of breath: She denies any shortness  of breath or cough but on deeper questioning she does admit to occasional shortness of breath that is very rare. She uses pro-air may be once every 2 weeks. She feels good. She's never had pulmonary function testing. She has a history of 56 pack smoking history but is quit. She does not want to pulmonary function test now but will do  it in the future   06/03/2015 Acute OV : Cavitary PNA  Pt presents for work in visit. Was seen 1 month ago . She is recovering from recurrent cavitary PNA on the right.  Previous episode in 05/2014 with right sided mass like consolidation. She was treated with abx. Serial CT showed near complete resolution on CT chest 09/2014.  Admitted in Nov 2016 with acute GI bleed and cavitary lung lesion. EGD showed a normal esophagus and gastritis with numerous clean, linear ulcers of the stomach and a few ulcers of the duodenal bulb. She did stabilize and was transitioned back on Eliquis  5mg  Twice daily   Prior to discharge CT chest showed a recurrent rounded masslike area of consolidation on the right lower lobe measuring 9.5 x 6.7 cm.  She was treated with aggressive ABX . Follow up CT chest in Jan showed interval improvement in RLL consolidation. Clinically she has been slowly improving.  She is very frail and has previous stroke years ago. She is on chronic pain meds as well for back issues.  She denies any swallow issues but does admit she eats in bed often. Says this week has not felt as good  Says that her right side was hurting when she lied down in bed and not as much energy. Feels much better today. CXR today shows further improvement in RLL opacity . No new areas noted.  She denies any bleeding , n/v/d , fever or chest pain. , orthopnea, hemoptysis , or edema.  Has ov with PCP next week for labs.  Last labs last month w/ improved H/H .    OV 07/12/2015  Chief Complaint  Patient presents with   Follow-up    Pt last seen on 06/03/15 for pna. Pt states she is feeling well now. Pt deines SOB, cough, wheezing, CP/tightness, f/c/s.     Follow-up recurrent pneumonia. Most recently 6 weeks ago she saw nurse practitioner. She says she is now recovered from respiratory infection. She and her husband state that she gets recurrent respiratory infection and is looking at ways to prevent it. Pulmonary  function test today shows gold stage II COPD. CT scan chest as a follow-up from pneumonia 2 years ago shows that things have resolved to scar tissue. There is no lung mass. Overall she's asymptomatic from a respiratory standpoint at baseline but then she is largely disabled because of spinal issues and does not exert much.  CT and PFT were personally visualized  Pulmonary function test 07/08/2015 FEV1 1.44 L/70%. No post bronchodilator response. FVC 2.1 L/76% and a ratio 69 consistent with Gold stage II COPD. Total lung capacity is 98%. DLCO is reduced at 11.33/46%  Ct Chest Wo Contrast  07/11/2015  CLINICAL DATA:  Lung nodules. EXAM: CT CHEST WITHOUT CONTRAST TECHNIQUE: Multidetector CT imaging of the chest was performed following the standard protocol without IV contrast. COMPARISON:  CT chest exams dating back to 01/15/2008. FINDINGS: Mediastinum/Nodes: Sub cm low-attenuation lesion in the right lobe of the thyroid  is incidentally noted. Mediastinal lymph nodes are not enlarged by CT size criteria. Hilar regions are difficult to definitively evaluate without IV  contrast. No axillary adenopathy. Surgical clips in the left axilla. Atherosclerotic calcification of the arterial vasculature. Dense calcification and narrowing of the right brachiocephalic artery. Three-vessel coronary artery calcification. Heart size normal. No pericardial effusion. Small hiatal hernia. Calcified subcarinal lymph node. Lungs/Pleura: Right apical pleural parenchymal scarring. Mild centrilobular emphysema. Post infectious scarring in the right middle and right lower lobes. A few scattered pulmonary nodules measure 4 mm or less in size, unchanged. Some are calcified. No pleural fluid. Airway is unremarkable. Upper abdomen: Visualized portions of the liver, adrenal glands unremarkable. Probable renal vascular calcifications bilaterally. Difficult to exclude renal stones as well. Visualized portions of the spleen, pancreas and stomach  are grossly unremarkable. Musculoskeletal: Degenerative changes are seen in the spine. No worrisome lytic or sclerotic lesions. Lower thoracic compression fractures, as before. IMPRESSION: 1. Post infectious scarring in the right middle and right lower lobes. 2. Three-vessel coronary artery calcification. 3. Advanced atherosclerotic calcification and narrowing of the proximal right brachiocephalic artery. Electronically Signed   By: Shearon Denis M.D.   On: 07/11/2015 11:25    OV 11/22/2015  Chief Complaint  Patient presents with   Follow-up    Pt states that she tries to take the Sprivia Resp daily - sometimes she forgets. Denies current breathing issues. Pt states that she does not have to use the Albuterol  HFA.     Follow-up Gold stage II COPD in the setting of neuromuscular issues due to spine and stroke. Currently on Spiriva .she is here with her husband. Overall she's doing well. She never uses albuterol  for rescue since her last visit. No interim exacerbations or urgent care visits. Sleeps well. She's not interested in pulmonary rehabilitation. She continues to Spiriva . She takes flu shot in the fall and is willing to do so again.    has a past medical history of Anemia (05/2014.); Atrial fibrillation (HCC); Breast cancer (HCC); Carotid artery occlusion; Carotid bruit; Degenerative arthritis of spine (2015); Hyperlipidemia; Hypertension; Meningioma (HCC); Osteoporosis; Pneumonia (May 19, 2014); Stroke [facility]); Subclavian steal syndrome; and Ulcers of both lower extremities (HCC) (2015).   reports that she quit smoking about 9 years ago. Her smoking use included Cigarettes. She has a 56.00 pack-year smoking history. She has never used smokeless tobacco.   OV 06/27/2016  Chief Complaint  Patient presents with   Follow-up    pt states she is doing well at this time, denies any breathing complaints.     Follow-up moderate COPD on single agent Spiriva . Last seen August 2017. She is here  with her husband. In the interim no COPD exacerbations of prednisone use or antibody use or admissions to the hospital emergency room visits for any reason. She is followed up with the gastroenterologist and neurologist for previous histories of atherosclerosis and GI bleeds. She is here with her husband. She did admit that she is not fully compliant with the Spiriva  but she stable.   OV 07/11/2017  Chief Complaint  Patient presents with   Follow-up    1-year follow up.  Pt stated she fell 05/10/17 and was off of her feet x6 weeks. Pt has SOB with exertion. States she has been noncompliant when it comes to using her inhalers or neb machine.     Follow-up moderate COPD on single agent Spiriva  and a wheelchair-bound female  There is a 1 year follow-up.  Since her last visit overall stable but earlier this year had hairline fracture in her left tibia.  And was then bedbound and also wheelchair-bound.  She has a brace.  She has had routine cardiology follow-up.  In terms of her respiratory status she is stable.  However she says she missed taking her Spiriva  inhalers for many months.  She does get some exertional dyspnea but only when there is too much exertion but for because she is wheelchair-bound and bedbound she does not feel it.  But she does recognize the need to take Spiriva  on a regular basis.  She is up-to-date with her vaccines.   Aaron Aas CAT COPD Symptom & Quality of Life Score (GSK trademark) 0 is no burden. 5 is highest burden 07/11/2017   Never Cough -> Cough all the time 0  No phlegm in chest -> Chest is full of phlegm 0  No chest tightness -> Chest feels very tight 0  No dyspnea for 1 flight stairs/hill -> Very dyspneic for 1 flight of stairs 3  No limitations for ADL at home -> Very limited with ADL at home 5  Confident leaving home -> Not at all confident leaving home 0  Sleep soundly -> Do not sleep soundly because of lung condition 0  Lots of Energy -> No energy at all 3  TOTAL  Score (max 40)  11      OV 12/22/2019  Subjective:  Patient ID: [name], female , DOB: 03-21-1941 , age 63 y.o. , MRN: [id number] , ADDRESS: [address] [name] [facility] [address]   12/22/2019 -   Chief Complaint  Patient presents with   Follow-up    breathing about the same     HPI [name] 83 y.o. -Personally not seen her in 2 years.  She is on Spiriva .  Last visit was in 2019 with me.  Then in 2020 she saw nurse practitioner.  She continues to do well on Spiriva .  COPD CAT score is 3 and improved.  Her husband is here with her.  They both expressed that Spiriva  sometimes can be very expensive when she runs into the donut hole and the cost is few $100.  They asked advice about how to handle this.  They also have questions about whether they should get a COVID-19 booster third shot.  She got vaccine second dose in February 2021 mRNA vaccine.  She also plans to have the flu shot with her primary care physician.  She knows that she needs to get the high-dose flu shot.  ROS - per HPI  IMPRESSION: CT  1. Post infectious scarring in the right middle and right lower lobes. 2. Three-vessel coronary artery calcification. 3. Advanced atherosclerotic calcification and narrowing of the proximal right brachiocephalic artery.     Electronically Signed   By: Shearon Denis M.D.   On: 07/11/2015 11:25    12/22/2019: OV with Dr. Bertrum Brodie.  Doing well on Spiriva ; however concerned that it can be quite expensive when she runs into the donut hole.  She was provided with 2 samples and advised to call in the future if she had any issues with affordability.  Discussed getting third COVID booster, which she was encouraged to do so given her long history.  10/25/2021: OV with Cobb NP for overdue follow-up.  She reports that she has been feeling well.  She has no concerns or complaints regarding her breathing.  She really does not notice any significant shortness of breath.  No limits in her  daily activities.  Her husband reports that she walks laps around him at the grocery store.  She is on  Spiriva  daily, which works well for her.  She never uses her albuterol  inhaler.  She has not required prednisone or antibiotics for acute exacerbations of her breathing.  04/27/2022: Today - follow up Patient presents today for 45-month follow-up.  She was recently admitted 03/05/2022 to the hospital for sepsis secondary to left foot cellulitis.  She was treated with IV vancomycin  and ceftriaxone  and transition to doxycycline  and Augmentin  to complete a 10-day course.  She was discharged home on 03/09/2022 after clinically improving.  Today, she tells me that she is feeling much better.  Feels like her breathing is at her baseline.  No significant concerns or complaints.  She does not have any cough, associated wheezing, or chest congestion.  Her insurance notified her that they will no longer be covering her Spiriva .  She needs an alternative inhaler for this.  Rare use of albuterol .    OV 08/21/2022  Subjective:  Patient ID: [name], female , DOB: Aug 17, 1940 , age 29 y.o. , MRN: [id number] , ADDRESS: [address] [name] [facility] [address] PCP Omie Bickers, MD Patient Care Team: Omie Bickers, MD as PCP - General (Internal Medicine) Loyde Rule, MD as PCP - Cardiology (Cardiology) Loyde Rule, MD (Cardiology) Pleasant Brilliant, MD as Consulting Physician (Orthopedic Surgery) Brandon Cairo, PT (Inactive) as Physical Therapist (Physical Therapy)  This Provider for this visit: Treatment Team:  Attending Provider: Maire Scot, MD    08/21/2022 -   Chief Complaint  Patient presents with   Follow-up    F/up on CT scan    HPI [name] 83 y.o. -personally not seen her in some years.  After that earlier this year she saw a nurse practitioner.  However after that she has had 3 admissions in 2024.  The first 1 was in February 2024 for acute heart failure with preserved  ejection fraction exacerbation.  Then the second 1 mid March 2024 for sepsis and acute respiratory failure with hypoxemia.  During this admission the CT scan of the chest that I personally visualized did show bilateral effusions and also consolidation.  However she did have a left lower lobe cavity that was new compared to 2017.  Then in April 2024 again another admission for acute on chronic heart failure with reduced ejection fraction.  Currently she is euvolemic.  However she is on Eliquis  and amiodarone  for atrial fibrillation and she fell last week and bruised her entire left face and even the left chest.  She is here with her husband.  He is an independent historian.  They tell me right now respiratory status is stable but the dealing with vascular ischemia of the lower extremities.  She had a CT scan of the chest Aug 16, 2022 that I personally visualized.  The effusions have resolved but there is a left lower lobe cavity that is somewhat thick-walled.  There is the same cavity that was seen in March 2024 and is unchanged.  It is definitely new from years prior.  She is here to discuss this.  COPD itself she feels is stable.  Her husband is hard of hearing.  Both of them needed repeated explanation.  She did indicate to me that she is absolutely not interested in lung biopsy..  I did indicate to them these nodules very likely early stage lung cancer but other differential diagnosis could be involved.  She does not want lung biopsy.  She also does not want lobectomy.  We discussed radiation therapy  for lung cancer she is willing to have this.  We talked about doing additional investigations that are noninvasive is way to enhance the pretest probability for lung cancer.  This involves blood test for QuantiFERON gold vasculitis and PET scan and also research protocol involving a nasal swab for genomic classifier for lung cancer.  She is interested in all this.  Gave her the copy of the consent form.  Other than  that she did indicate that she has anxiety about an upcoming MRI.  She wanted to know if she could take an anxiolytic.  I advised her that she can take a low-dose Xanax .  Her FEV1 a few years ago was acceptable.    Narrative & Impression  CLINICAL DATA:  Follow-up abnormal chest x-ray   EXAM: CT CHEST WITHOUT CONTRAST   TECHNIQUE: Multidetector CT imaging of the chest was performed following the standard protocol without IV contrast.   RADIATION DOSE REDUCTION: This exam was performed according to the departmental dose-optimization program which includes automated exposure control, adjustment of the mA and/or kV according to patient size and/or use of iterative reconstruction technique.   COMPARISON:  CT chest, abdomen and pelvis dated June 18, 2022; chest x-ray dated July 09, 2022   FINDINGS: Cardiovascular: Cardiomegaly. No pericardial effusion. Normal caliber thoracic aorta with severe calcified plaque. Severe left main and three-vessel coronary artery calcifications.   Mediastinum/Nodes: Moderate hiatal hernia. Esophagus is unremarkable. No enlarged lymph nodes seen in the chest. Mildly enlarged right lower paratracheal lymph node measuring 12 mm on series 2, image 60, unchanged when compared with the prior exam.   Lungs/Pleura: Central airways are patent. Linear opacity of the right lung apex, consistent with pleural-parenchymal scarring. Moderate centrilobular emphysema. Mild diffuse ground-glass opacities and septal thickening. Solid cavitary nodule of the left lower lobe measuring 12 mm on series 4, image 109, unchanged when compared with the prior exam. Additional small scattered solid pulmonary nodules are seen, example 5 mm nodule of the superior portion of the right lower lobe located on image 79, unchanged when compared with the prior. Scattered basilar linear opacities which are likely due to scarring or atelectasis. Trace left pleural effusion.   Upper  Abdomen: No acute abnormality.   Musculoskeletal: Severe compression deformities of T10 and T12 with retropulsion, unchanged when compared with the prior exam. No acute osseous abnormality.   IMPRESSION: 1. Persistent cavitary nodule of the left lower lobe, concerning for primary lung malignancy. Additional imaging evaluation or consultation with Pulmonology or Thoracic Surgery is recommended. 2. Mild pulmonary edema and trace left pleural effusion. 3. Cardiomegaly with severe left main and three-vessel coronary artery calcifications. 4. Moderate hiatal hernia. 5. Aortic Atherosclerosis (ICD10-I70.0) and Emphysema (ICD10-J43.9).     Electronically Signed   By: Avelino Lek M.D.   On: 08/17/2022 10:29    OV 11/02/2022  Subjective:  Patient ID: [name], female , DOB: 1940-05-15 , age 60 y.o. , MRN: [id number] , ADDRESS: [address] [name] [facility] [address] PCP Omie Bickers, MD Patient Care Team: Omie Bickers, MD as PCP - General (Internal Medicine) Loyde Rule, MD as PCP - Cardiology (Cardiology) Loyde Rule, MD (Cardiology) Pleasant Brilliant, MD as Consulting Physician (Orthopedic Surgery) Brandon Cairo, PT (Inactive) as Physical Therapist (Physical Therapy)  This Provider for this visit: Treatment Team:  Attending Provider: Maire Scot, MD    11/02/2022 -   Chief Complaint  Patient presents with   Follow-up    F/up, no new complaints.  HPI [name] 83 y.o. -presents for follow-up.  Presents with her husband.  #COPD: This is stable she is on Incruse no complaints  #Left lower lobe cavitary nodule: This appears new in March 2024.  PET scan in June 2024 showed it was persistent with very low uptake.  Follow-up CT scan is recommended but she has refused.  She does not want to know what the diagnosis is.  She knows it might be early stage lung cancer.  She does not want radiation or chemo even if the side effects are low.  She says she  has had a good life and she is thankful she has a great husband.  Therefore she does not even want follow-up CT.  Other issues - Nonhealing wound right ankle:'s been going on and off for 3 months.  She has wound home care. -She had admission in April 2024 for acute on chronic heart failure with reduced ejection fraction. -Also July 2024 critical limb ischemia addressed by Dr. Charlotte Cookey    NM PET Scan June 2024   IMPRESSION: 11 mm cavitary lesion in the left lower lobe, non FDG avid. While this may be infectious/inflammatory, a low-grade neoplasm is not excluded. Follow-up CT chest is suggested in 3-6 months.   No findings suspicious for metastatic disease.     Electronically Signed   By: Zadie Herter M.D.   On: 09/12/2022 00:05   OV 09/17/2023  Subjective:  Patient ID: [name], female , DOB: 28-Nov-1940 , age 76 y.o. , MRN: [id number] , ADDRESS: [address] [name] [facility] [address] PCP Omie Bickers, MD Patient Care Team: Omie Bickers, MD as PCP - General (Internal Medicine) Loyde Rule, MD as PCP - Cardiology (Cardiology) Loyde Rule, MD (Cardiology) Pleasant Brilliant, MD as Consulting Physician (Orthopedic Surgery) Brandon Cairo, PT (Inactive) as Physical Therapist (Physical Therapy)  This Provider for this visit: Treatment Team:  Attending Provider: Maire Scot, MD   #Former smoker #Moderate COPD  #Frail with lean BMI. #11 mm left lower lobe cavitary nodule -new March 2024 and persistent but PET - June 2024  - April 2025 worsening of the nodule to greater than 2 cm associate with concern for mediastinal adenopathy. #Admission  - April 2024 with CODE STATUS DNR. -August 2024 for acute on chronic systolic and diastolic heart failure requiring IV diuresis. -December 2024 for sepsis - April 2025 for acute respiratory failure secondary to pneumonia and pulmonary edema.   09/17/2023 -   Chief Complaint  Patient presents with   Follow-up     Follow up 1 year  for copd . Pt stated that she is wear oxygen at night only 2L.     HPI [name] 83 y.o. -returns for follow-up.  Last seen in July 2024.  Since then she has had multiple admissions including August 2024 for heart failure December 2020 for sepsis and then in April 2025 for respiratory failure secondary to pneumonia and pulmonary edema.  In the most recent admission when they did a CT scan of the chest they found the lung nodule had enlarged along with concern for mediastinal adenopathy.  She again states that she is ready to pass away and she enjoying her quite current quality of life.  She does not want any follow-up CT scan or radiation treatment or biopsies.  She is content with her life.  Husband also agrees to this he is here with her today and is an independent historian.  She is  to have a nonhealing wound ulcer on the right ankle lateral aspect but this is now healing well.  Since Easter 2025 no hospitalizations no ER visits no surgeries.  She is now using oxygen at night at home.  She is on Incruse.    PFT     Latest Ref Rng & Units 07/08/2015    7:53 AM  PFT Results  FVC-Pre L 2.09   FVC-Predicted Pre % 76   FVC-Post L 1.96   FVC-Predicted Post % 71   Pre FEV1/FVC % % 69   Post FEV1/FCV % % 67   FEV1-Pre L 1.44   FEV1-Predicted Pre % 70   FEV1-Post L 1.31   DLCO uncorrected ml/min/mmHg 11.33   DLCO UNC% % 46   DLVA Predicted % 73   TLC L 4.96   TLC % Predicted % 98   RV % Predicted % 135        LAB RESULTS last 96 hours No results found.       has a past medical history of Anemia (05/2014), Atrial fibrillation (HCC), Breast cancer [facility]), Carotid artery occlusion, Carotid bruit, Degenerative arthritis of spine (2015), Hyperlipidemia, Hypertension, Meningioma (HCC), Osteoporosis, Peripheral arterial disease (HCC), Pneumonia (05/19/2014), Stroke [facility]), Subclavian steal syndrome, and Ulcers of both lower extremities (HCC) (2015).   reports that she  quit smoking about 17 years ago. Her smoking use included cigarettes. She started smoking about 73 years ago. She has a 56 pack-year smoking history. She has never been exposed to tobacco smoke. She has never used smokeless tobacco.  Past Surgical History:  Procedure Laterality Date   ABDOMINAL AORTOGRAM W/LOWER EXTREMITY N/A 12/19/2021   Procedure: ABDOMINAL AORTOGRAM W/LOWER EXTREMITY;  Surgeon: Margherita Shell, MD;  [facility];  Service: Cardiovascular;  Laterality: N/A;   ABDOMINAL AORTOGRAM W/LOWER EXTREMITY Bilateral 08/28/2022   Procedure: ABDOMINAL AORTOGRAM W/LOWER EXTREMITY;  Surgeon: Margherita Shell, MD;  [facility];  Service: Cardiovascular;  Laterality: Bilateral;   ABDOMINAL AORTOGRAM W/LOWER EXTREMITY N/A 10/16/2022   Procedure: ABDOMINAL AORTOGRAM W/LOWER EXTREMITY;  Surgeon: Margherita Shell, MD;  [facility];  Service: Cardiovascular;  Laterality: N/A;   ABDOMINAL AORTOGRAM W/LOWER EXTREMITY N/A 02/26/2023   Procedure: ABDOMINAL AORTOGRAM W/LOWER EXTREMITY;  Surgeon: Margherita Shell, MD;  [facility];  Service: Cardiovascular;  Laterality: N/A;   ABDOMINAL AORTOGRAM W/LOWER EXTREMITY N/A 07/16/2023   Procedure: ABDOMINAL AORTOGRAM W/LOWER EXTREMITY;  Surgeon: Margherita Shell, MD;  [facility];  Service: Cardiovascular;  Laterality: N/A;   AMPUTATION TOE Left 12/29/2021   Procedure: Incisional DEBRIDEMENT Left Foot with Amputation Left Great Toe;  Surgeon: Velma Ghazi, DPM;  [facility]: [facility];  Service: Podiatry;  Laterality: Left;   CARDIAC CATHETERIZATION     CATARACT EXTRACTION Bilateral 2013   COLONOSCOPY  11/17/2010   Procedure: COLONOSCOPY;  Surgeon: Ruby Corporal, MD;  [facility]: [facility];  Service: Endoscopy;  Laterality: N/A;  10:45 am   ESOPHAGOGASTRODUODENOSCOPY  11/17/2010   Procedure: ESOPHAGOGASTRODUODENOSCOPY (EGD);  Surgeon: Ruby Corporal, MD;  [facility];   Service: Endoscopy;  Laterality: N/A;   ESOPHAGOGASTRODUODENOSCOPY N/A 02/16/2015   Procedure: ESOPHAGOGASTRODUODENOSCOPY (EGD);  Surgeon: Danette Duos, MD;  Location: [facility];  Service: Gastroenterology;  Laterality: N/A;   EXPLORATORY LAPAROTOMY  1960s   For peritonitis of undetermined cause   EYE SURGERY     INCISION AND DRAINAGE OF WOUND Right 03/15/2023  Procedure: IRRIGATION AND DEBRIDEMENT WOUND;  Surgeon: Evertt Hoe, DPM;  [facility];  Service: Orthopedics/Podiatry;  Laterality: Right;  Right ankle ulcer debridement, bone biopsy, graft application   LOWER EXTREMITY ANGIOGRAPHY N/A 07/16/2023   Procedure: Lower Extremity Angiography;  Surgeon: Margherita Shell, MD;  [facility];  Service: Cardiovascular;  Laterality: N/A;   LOWER EXTREMITY INTERVENTION Left 07/16/2023   Procedure: LOWER EXTREMITY INTERVENTION;  Surgeon: Margherita Shell, MD;  [facility];  Service: Cardiovascular;  Laterality: Left;  SFA laser   LUMBAR EPIDURAL INJECTION  06-2012--06-2013   pt. states she has had 5 epidurals in 06-2012----06-2013   MASTECTOMY Left 1990   PERIPHERAL VASCULAR ATHERECTOMY  08/28/2022   Procedure: PERIPHERAL VASCULAR ATHERECTOMY;  Surgeon: Margherita Shell, MD;  [facility];  Service: Cardiovascular;;  Lt Popliteal and AT   PERIPHERAL VASCULAR ATHERECTOMY  02/26/2023   Procedure: PERIPHERAL VASCULAR ATHERECTOMY;  Surgeon: Margherita Shell, MD;  [facility];  Service: Cardiovascular;;   PERIPHERAL VASCULAR BALLOON ANGIOPLASTY  08/28/2022   Procedure: PERIPHERAL VASCULAR BALLOON ANGIOPLASTY;  Surgeon: Margherita Shell, MD;  [facility];  Service: Cardiovascular;;  Lt Popliteal and AT   PERIPHERAL VASCULAR BALLOON ANGIOPLASTY  10/16/2022   Procedure: PERIPHERAL VASCULAR BALLOON ANGIOPLASTY;  Surgeon: Margherita Shell, MD;  [facility];  Service: Cardiovascular;;  Rt AT    PERIPHERAL VASCULAR BALLOON ANGIOPLASTY  02/26/2023   Procedure: PERIPHERAL VASCULAR BALLOON ANGIOPLASTY;  Surgeon: Margherita Shell, MD;  [facility];  Service: Cardiovascular;;   PERIPHERAL VASCULAR INTERVENTION Left 12/19/2021   Procedure: PERIPHERAL VASCULAR INTERVENTION;  Surgeon: Margherita Shell, MD;  [facility];  Service: Cardiovascular;  Laterality: Left;  SFA and PTA of POP   YAG LASER APPLICATION Right 05/03/2015   Procedure: YAG LASER APPLICATION;  Surgeon: Albert Huff, MD;  Location: [facility];  Service: Ophthalmology;  Laterality: Right;  right   YAG LASER APPLICATION Left 05/24/2015   Procedure: YAG LASER APPLICATION;  Surgeon: Albert Huff, MD;  Location: [facility];  Service: Ophthalmology;  Laterality: Left;    Allergies  Allergen Reactions   Iodine  Rash and Other (See Comments)    BETADINE, blistering rash/burns   Iohexol  Rash and Other (See Comments)    Blisters; PT NEEDS 13-HOUR PREP    Petroleum Gauze Non-Woven 3x9" [Wound Dressings] Other (See Comments)    BURNING SENSATION, RED SKIN   Sulfa  Antibiotics Nausea And Vomiting   Wound Dressing Adhesive Other (See Comments)    Burns skin   Tape Itching, Rash and Other (See Comments)    "DO NOT USE ADHESIVE TAPE" Not even a Band Aid" NO PAPER TAPE Burns/tears skin Skin is very very thin    Immunization History  Administered Date(s) Administered   Influenza Split 01/07/2014   Influenza, High Dose Seasonal PF 01/28/2016, 01/07/2017, 12/17/2018, 01/16/2022, 01/02/2023   Influenza,inj,Quad PF,6+ Mos 01/12/2015   Influenza,inj,quad, With Preservative 12/16/2020   Influenza-Unspecified 01/07/2013, 01/01/2022, 01/16/2022   Moderna Sars-Covid-2 Vaccination 05/02/2019, 06/01/2019, 01/22/2023   PNEUMOCOCCAL CONJUGATE-20 02/01/2023   Pneumococcal Conjugate-13 01/07/2014   Pneumococcal Polysaccharide-23 01/07/2017   Rsv, Bivalent, Protein Subunit Rsvpref,pf Pattricia Bores) 03/23/2022, 01/09/2023    Tdap 08/01/2011   Zoster Recombinant(Shingrix) 01/10/2017    Family History  Problem Relation Age of Onset   Alzheimer's disease Mother    Dementia Mother    Hypertension Mother    Hyperlipidemia Mother  Parkinsonism Father      Current Outpatient Medications:    acetaminophen  (TYLENOL ) 325 MG tablet, Take 2 tablets (650 mg total) by mouth every 6 (six) hours as needed for mild pain (pain score 1-3) (or Fever >/= 101)., Disp: 30 tablet, Rfl: 0   AMBULATORY NON FORMULARY MEDICATION, Dispense one Readi Steadi glove, Disp: 1 Units, Rfl: 0   amiodarone  (PACERONE ) 200 MG tablet, Take 0.5 tablets (100 mg total) by mouth every other day. (Patient taking differently: Take 200 mg by mouth daily.), Disp: , Rfl:    apixaban  (ELIQUIS ) 2.5 MG TABS tablet, Take 1 tablet (2.5 mg total) by mouth 2 (two) times daily., Disp: 180 tablet, Rfl: 1   ascorbic acid  (VITAMIN C ) 500 MG tablet, Take 500 mg by mouth in the morning., Disp: , Rfl:    CALCIUM PO, Take 1,200 mg by mouth 2 (two) times daily. 600 mg each, Disp: , Rfl:    Camphor-Menthol-Methyl Sal (SALONPAS) 3.04-14-08 % PTCH, Place 1 patch onto the skin daily as needed (pain.)., Disp: , Rfl:    clopidogrel  (PLAVIX ) 75 MG tablet, TAKE 1 TABLET EVERY MORNING, Disp: 90 tablet, Rfl: 2   denosumab  (PROLIA ) 60 MG/ML SOSY injection, Inject 60 mg into the skin every 6 (six) months., Disp: , Rfl:    Ferrous Sulfate  (IRON PO), Take 65 mg by mouth at bedtime., Disp: , Rfl:    levothyroxine (SYNTHROID) 50 MCG tablet, Take 50 mcg by mouth daily before breakfast., Disp: , Rfl:    MAGNESIUM  OXIDE PO, Take 500 mg by mouth every morning., Disp: , Rfl:    metoprolol  tartrate (LOPRESSOR ) 25 MG tablet, Take 3 tablets (75 mg total) by mouth 2 (two) times daily., Disp: 540 tablet, Rfl: 1   Multiple Vitamin (MULTIVITAMIN WITH MINERALS) TABS tablet, Take 1 tablet by mouth every morning., Disp: , Rfl:    mupirocin  ointment (BACTROBAN ) 2 %, Apply 1 Application topically daily.,  Disp: 22 g, Rfl: 0   pantoprazole  (PROTONIX ) 40 MG tablet, TAKE 1 TABLET DAILY, Disp: 90 tablet, Rfl: 3   potassium chloride  (KLOR-CON  M) 10 MEQ tablet, Take 2 tablets (20 mEq total) by mouth daily. (Patient taking differently: Take 10 mEq by mouth daily.), Disp: 120 tablet, Rfl: 3   pravastatin  (PRAVACHOL ) 40 MG tablet, Take 1 tablet (40 mg total) by mouth daily., Disp: 90 tablet, Rfl: 1   torsemide  (DEMADEX ) 20 MG tablet, Take 2 tablets (40 mg total) by mouth daily., Disp: 60 tablet, Rfl: 1   umeclidinium bromide  (INCRUSE ELLIPTA ) 62.5 MCG/ACT AEPB, Inhale 1 puff into the lungs daily., Disp: 90 each, Rfl: 3   albuterol  (VENTOLIN  HFA) 108 (90 Base) MCG/ACT inhaler, Inhale 2 puffs into the lungs every 6 (six) hours as needed for wheezing or shortness of breath. (Patient not taking: Reported on 09/17/2023), Disp: 18 g, Rfl: 1      Objective:   Vitals:   09/17/23 1016  BP: 132/70  Pulse: (!) 103  SpO2: 91%  Weight: 94 lb (42.6 kg)  Height: 5\' 2"  (1.575 m)    Estimated body mass index is 17.19 kg/m as calculated from the following:   Height as of this encounter: 5\' 2"  (1.575 m).   Weight as of this encounter: 94 lb (42.6 kg).  @WEIGHTCHANGE @  [employer]   09/17/23 1016  Weight: 94 lb (42.6 kg)     Physical Exam   General: No distress. Frail. Sits on wheel chiar O2 at rest: no Cane present: no Sitting in wheel chair:  YES Frail: YS Obese: no Neuro: Alert and Oriented x 3. GCS 15. Speech normal Psych: Pleasant Resp:  Barrel Chest - no.  Wheeze - no, Crackles - no, No overt respiratory distress CVS: Normal heart sounds. Murmurs - no Ext: Stigmata of Connective Tissue Disease - no sKIN bruised baseline HEENT: Normal upper airway. PEERL +. No post nasal drip        Assessment:       ICD-10-CM   1. COPD, moderate (HCC)  J44.9     2. Nodule of lower lobe of left lung  R91.1          Plan:     Patient Instructions     ICD-10-CM   1. COPD, moderate (HCC)   J44.9     2. Nodule of lower lobe of left lung  R91.1           COPD, moderate (HCC)  -Stable COPD  Plan -Continue INCRUSE daily with albuterol  as needed - high dose flu shot in fall - continue night o2  Lung nodule left lower lobe - cavitary  - new in mrch 2024 and persistent in May 2024. PET scan COLD in Jun 2024  WORSE size in April 2025 with concern for  cancer and potential spread to mediastinal adenopathy  - respect 0% desire for lung biopsy, radiation, chemo and even followup CT chest   Plan - expectant followup respecting your wishes   Follow-up - Dr. Bertrum Brodie or nurse practitioner in the next 6 months   FOLLOWUP Return in about 6 months (around 03/18/2024) for Face to Face Visit, with any of the [facility], with Dr Bertrum Brodie.    SIGNATURE    Dr. Maire Scot, M.D., F.C.C.P,  Pulmonary and Critical Care Medicine Staff Physician, [facility] Center Director - Interstitial Lung Disease  Program  Pulmonary Fibrosis [facility] [place], [place], [address]  Pager: [phone], If no answer or between  15:00h - 7:00h: call [phone] Telephone: [phone]  10:53 AM 09/17/2023

## 2023-09-24 ENCOUNTER — Ambulatory Visit (INDEPENDENT_AMBULATORY_CARE_PROVIDER_SITE_OTHER): Admitting: Podiatry

## 2023-09-24 DIAGNOSIS — L97522 Non-pressure chronic ulcer of other part of left foot with fat layer exposed: Secondary | ICD-10-CM | POA: Diagnosis not present

## 2023-09-24 DIAGNOSIS — L97312 Non-pressure chronic ulcer of right ankle with fat layer exposed: Secondary | ICD-10-CM | POA: Diagnosis not present

## 2023-09-24 NOTE — Progress Notes (Signed)
  Subjective:  Patient ID: [name], female    DOB: 28-Dec-1940,  MRN: [id number]  DOS: 03/15/2023 Procedure: Right ankle ulcer debridement with prep for graft, bone biopsies distal fibula, application of arthroflex dermal allograft  83 y.o. female returns for post-op check.  Reports the wound continues to be improving with local wound care.  Has ongoing wound care with mupirocin  ointment for her husband.  Does report new wounds on the left second toe and outside the fifth toe.  Reports she pulled some scab off the distal aspect of the left second toe and it was red and swollen for a little while.  She has not been on any antibiotics but it seems to have improved.  No pain currently.  Review of Systems: Negative except as noted in the HPI. Denies N/V/F/Ch.   Objective:  There were no vitals filed for this visit. There is no height or weight on file to calculate BMI. Constitutional Well developed. Well nourished.  Vascular Foot warm and well perfused. Capillary refill normal to all digits.  Calf is soft and supple, no posterior calf or knee pain, negative Homans' sign  Neurologic Normal speech. Oriented to person, place, and time. Epicritic sensation to light touch grossly present bilaterally.  Dermatologic Ulceration the right lateral ankle is now fully healed.  There is small ulceration of the distal tuft of the left second toe.  No significant erythema or edema of the distal aspect of the second toe. There is also small superficial ulceration limited breakdown of skin at the lateral fifth metatarsal head that was seen after debridement of overlying hyperkeratotic tissue.       Orthopedic: Tenderness to palpation noted about the surgical site.   Multiple view plain film radiographs: Deferred   Path: Ulceration sent for pathology showed ulceration with acute inflammation and granulation tissue.  No specific diagnosis given as to any type of abnormal ulceration.  Bone biopsy right  ankle distal fibula resulted with benign bone and connective tissue no inflammatory changes were identified.  Culture: Corynebacterium stratum, group A strep Assessment:   1. Ankle ulcer, right, with fat layer exposed (HCC)   2. Ulcer of left foot with fat layer exposed (HCC)      Plan:  Patient was evaluated and treated and all questions answered.  # Superficial ulceration of the distal tuft of the left second toe as well as the lateral aspect fifth metatarsal head without evidence of deep infection - Recommend local wound care with mupirocin  ointment applied to the wounds daily and cover with Band-Aid or gauze dressing - Offloading with the second toe with gel toe cap which was dispensed to the patient - Weightbearing as tolerated in postop shoe to the left foot, patient has postop shoe from prior - No antibiotics currently indicated if she has worsening redness swelling or drainage she is to call and I will call some in for her - If no improvement or getting worse in the future would have to consider x-ray of the left foot. - Follow-up in 3 weeks for wound check to the left foot  S/p foot surgery right ankle ulcer debridement with prep for graft fibular bone biopsy and dermal allograft application - Right lateral ankle ulceration as well as the medial ulcers are now fully healed no further wound care is needed at this area          Maridee Shoemaker, DPM [facility] / [facility]

## 2023-10-15 ENCOUNTER — Ambulatory Visit (INDEPENDENT_AMBULATORY_CARE_PROVIDER_SITE_OTHER): Admitting: Podiatry

## 2023-10-15 ENCOUNTER — Encounter: Payer: Self-pay | Admitting: Podiatry

## 2023-10-15 VITALS — Ht 62.0 in | Wt 94.0 lb

## 2023-10-15 DIAGNOSIS — L97312 Non-pressure chronic ulcer of right ankle with fat layer exposed: Secondary | ICD-10-CM | POA: Diagnosis not present

## 2023-10-15 DIAGNOSIS — L97522 Non-pressure chronic ulcer of other part of left foot with fat layer exposed: Secondary | ICD-10-CM

## 2023-10-15 DIAGNOSIS — Z9889 Other specified postprocedural states: Secondary | ICD-10-CM | POA: Diagnosis not present

## 2023-10-15 NOTE — Progress Notes (Signed)
  Subjective:  Patient ID: [name], female    DOB: 06-21-1940,  MRN: [id number]  DOS: 03/15/2023 Procedure: Right ankle ulcer debridement with prep for graft, bone biopsies distal fibula, application of arthroflex dermal allograft  83 y.o. female returns for post-op check.  Following up for ulcers on the end of the second toe as well as the outside of the fifth metatarsal head.  I recommended mupirocin  ointment and gauze dressing or band aid style dressing daily.  She reports symptoms have been doing well no concerns  Review of Systems: Negative except as noted in the HPI. Denies N/V/F/Ch.   Objective:  There were no vitals filed for this visit. There is no height or weight on file to calculate BMI. Constitutional Well developed. Well nourished.  Vascular Foot warm and well perfused. Capillary refill normal to all digits.  Calf is soft and supple, no posterior calf or knee pain, negative Homans' sign  Neurologic Normal speech. Oriented to person, place, and time. Epicritic sensation to light touch grossly present bilaterally.  Dermatologic Ulceration the right lateral ankle is now fully healed.  Small ulcerations present on the distal tuft of the left second toe as well as the lateral aspect of the fifth metatarsal head are nearly fully healed much improved from prior         Orthopedic: Tenderness to palpation noted about the surgical site.   Multiple view plain film radiographs: Deferred   Path: Ulceration sent for pathology showed ulceration with acute inflammation and granulation tissue.  No specific diagnosis given as to any type of abnormal ulceration.  Bone biopsy right ankle distal fibula resulted with benign bone and connective tissue no inflammatory changes were identified.  Culture: Corynebacterium stratum, group A strep Assessment:   1. Ulcer of left foot with fat layer exposed (HCC)   2. Ankle ulcer, right, with fat layer exposed (HCC)   3. Post-operative  state       Plan:  Patient was evaluated and treated and all questions answered.  # Superficial ulceration of the distal tuft of the left second toe as well as the lateral aspect fifth metatarsal head without evidence of deep infection -Wounds improving significantly with wound care and debridement which was performed today - Recommend continue local wound care with mupirocin  ointment applied to the wounds daily and cover with Band-Aid or gauze dressing - Offloading with the second toe with gel toe cap   - Weightbearing as tolerated in regular shoes - No antibiotics currently indicated  - Follow-up in 3 weeks for wound check to the left foot  S/p foot surgery right ankle ulcer debridement with prep for graft fibular bone biopsy and dermal allograft application - Right lateral ankle ulceration as well as the medial ulcers are now fully healed no further wound care is needed at this area          [name], DPM [facility] / [facility]

## 2023-10-17 ENCOUNTER — Other Ambulatory Visit: Payer: Self-pay | Admitting: Cardiovascular Disease

## 2023-10-24 ENCOUNTER — Telehealth: Payer: Self-pay | Admitting: Cardiovascular Disease

## 2023-10-24 NOTE — Telephone Encounter (Signed)
 Pt c/o medication issue:  1. Name of Medication: Diazepam   2. How are you currently taking this medication (dosage and times per day)?   3. Are you having a reaction (difficulty breathing--STAT)?   4. What is your medication issue? Pt is have a MRI and her doctor called in this medication. She wants to make sure it is ok to take with her other medications

## 2023-10-24 NOTE — Telephone Encounter (Signed)
 Patient contacted and advised of Lonni Pavero's note. Patient advised to contact [facility] to confirm how to take dose of Valium  for MRI because they are prescribing physician.

## 2023-10-24 NOTE — Telephone Encounter (Signed)
 Please review and advise.

## 2023-10-31 ENCOUNTER — Other Ambulatory Visit: Payer: Self-pay

## 2023-10-31 MED ORDER — CLOPIDOGREL BISULFATE 75 MG PO TABS: 75.0000 mg | ORAL_TABLET | Freq: Every morning | ORAL | 3 refills | Status: AC

## 2023-11-05 ENCOUNTER — Ambulatory Visit (INDEPENDENT_AMBULATORY_CARE_PROVIDER_SITE_OTHER): Admitting: Podiatry

## 2023-11-05 ENCOUNTER — Encounter: Payer: Self-pay | Admitting: Podiatry

## 2023-11-05 VITALS — BP 185/74 | HR 65

## 2023-11-05 DIAGNOSIS — L97522 Non-pressure chronic ulcer of other part of left foot with fat layer exposed: Secondary | ICD-10-CM | POA: Diagnosis not present

## 2023-11-05 DIAGNOSIS — Z9889 Other specified postprocedural states: Secondary | ICD-10-CM | POA: Diagnosis not present

## 2023-11-05 DIAGNOSIS — L97312 Non-pressure chronic ulcer of right ankle with fat layer exposed: Secondary | ICD-10-CM | POA: Diagnosis not present

## 2023-11-05 NOTE — Progress Notes (Signed)
  Subjective:  Patient ID: [name], female    DOB: [birth year],  MRN: [id number]  DOS: 03/15/2023 Procedure: Right ankle ulcer debridement with prep for graft, bone biopsies distal fibula, application of arthroflex dermal allograft  83 y.o. female returns for post-op check.  Following up for ulcers on the end of the second toe as well as the outside of the fifth metatarsal head.  I recommended mupirocin  ointment and gauze dressing or band aid style dressing daily.  She reports symptoms have been doing well no concerns.  Reports the right ankle ulcer is fully healed no drainage.  Review of Systems: Negative except as noted in the HPI. Denies N/V/F/Ch.   Objective:   Vitals:   11/05/23 1439  BP: (!) 185/74  Pulse: 65   There is no height or weight on file to calculate BMI. Constitutional Well developed. Well nourished.  Vascular Foot warm and well perfused. Capillary refill normal to all digits.  Calf is soft and supple, no posterior calf or knee pain, negative Homans' sign  Neurologic Normal speech. Oriented to person, place, and time. Epicritic sensation to light touch grossly present bilaterally.  Dermatologic Ulceration the right lateral ankle is now fully healed.  Small ulcerations present limited to breakdown of skin at the lateral aspect of the fifth metatarsal head on the left foot.  Pinpoint very small in nature no active drainage or erythema surrounding  Prior wound at the left second toe is now fully healed.       Orthopedic: Tenderness to palpation noted about the surgical site.   Multiple view plain film radiographs: Deferred   Path: Ulceration sent for pathology showed ulceration with acute inflammation and granulation tissue.  No specific diagnosis given as to any type of abnormal ulceration.  Bone biopsy right ankle distal fibula resulted with benign bone and connective tissue no inflammatory changes were identified.  Culture: Corynebacterium stratum, group  A strep Assessment:   1. Ulcer of left foot with fat layer exposed (HCC)   2. Ankle ulcer, right, with fat layer exposed (HCC)   3. Post-operative state        Plan:  Patient was evaluated and treated and all questions answered.  # Superficial ulceration of the distal tuft of the left second toe as well as the lateral aspect fifth metatarsal head without evidence of deep infection -Wound at the distal tuft of the left second toe is now fully healed no wound care needed there -Very superficial pinpoint ulceration lateral aspect fifth metatarsal head continue wound care as below - Recommend continue local wound care with mupirocin  ointment applied to the wounds daily and cover with Band-Aid or gauze dressing - Weightbearing as tolerated in regular shoes - No antibiotics currently indicated  - Follow-up in 3 weeks for wound check to the left foot  S/p foot surgery right ankle ulcer debridement with prep for graft fibular bone biopsy and dermal allograft application - Right lateral ankle ulceration as well as the medial ulcers are now fully healed no further wound care is needed at this area          [name], DPM [facility] / [facility]

## 2023-11-06 ENCOUNTER — Telehealth: Payer: Self-pay | Admitting: Cardiovascular Disease

## 2023-11-06 NOTE — Telephone Encounter (Signed)
 Rx sent 7/24 to pharmacy

## 2023-11-06 NOTE — Telephone Encounter (Signed)
*  STAT* If patient is at the pharmacy, call can be transferred to refill team.   1. Which medications need to be refilled? (please list name of each medication and dose if known)   clopidogrel  (PLAVIX ) 75 MG tablet   2. Would you like to learn more about the convenience, safety, & potential cost savings by using the [facility]?   3. Are you open to using the [facility] (Type [facility]. ).  4. Which pharmacy/location (including street and city if local pharmacy) is medication to be sent to?  [facility] - [place], [place] - [address] [facility] AT Portal to Registered [facility] Sites   5. Do they need a 30 day or 90 day supply?   90 day  Husband (John) stated [facility] told him they had not received the refill prescription and wants it re-sent to them.  Husband noted patient still has some medication.

## 2023-11-18 ENCOUNTER — Telehealth: Payer: Self-pay

## 2023-11-18 NOTE — Telephone Encounter (Signed)
 Auth Submission: APPROVED Site of care: Site of care: [facility] Payer: [employer] medicare Medication & CPT/J Code(s) submitted: Prolia  (Denosumab ) [id number] Diagnosis Code:  Route of submission (phone, fax, portal): portal Phone # Fax # Auth type: Buy/Bill PB Units/visits requested: 60mg , q72months x 2doses Reference number: [id number] Approval from: 11/18/23 to 11/17/24

## 2023-11-26 ENCOUNTER — Telehealth: Payer: Self-pay | Admitting: Cardiovascular Disease

## 2023-11-26 ENCOUNTER — Encounter: Payer: Self-pay | Admitting: Podiatry

## 2023-11-26 ENCOUNTER — Ambulatory Visit (INDEPENDENT_AMBULATORY_CARE_PROVIDER_SITE_OTHER): Admitting: Podiatry

## 2023-11-26 VITALS — BP 186/73 | HR 76

## 2023-11-26 DIAGNOSIS — L97522 Non-pressure chronic ulcer of other part of left foot with fat layer exposed: Secondary | ICD-10-CM

## 2023-11-26 DIAGNOSIS — L97312 Non-pressure chronic ulcer of right ankle with fat layer exposed: Secondary | ICD-10-CM | POA: Diagnosis not present

## 2023-11-26 DIAGNOSIS — I999 Unspecified disorder of circulatory system: Secondary | ICD-10-CM

## 2023-11-26 MED ORDER — MUPIROCIN 2 % EX OINT: 1.0000 | TOPICAL_OINTMENT | Freq: Every day | CUTANEOUS | 0 refills | Status: AC

## 2023-11-26 NOTE — Telephone Encounter (Signed)
 [name] with [employer] [facility] called in stating Dr. Beverley Judges saw the pt today and said his cholesterol levels were elevated. She states pt is still taking pravastatin  but wants to know if Dr. Delford is okay with switching her to Crestor either 10mg  or 20mg . Please advise.

## 2023-11-26 NOTE — Telephone Encounter (Signed)
 Called Brook with [facility] and informed her of Dr. Claiborne advisement. She will have their provider change   Delford Maude BROCKS, MD to [facility] Triage  Drena Noel Males, RN   11/26/23 12:02 PM Don;t have those labs in Epic but ok to change to crestor 10 mg  order.

## 2023-11-26 NOTE — Progress Notes (Signed)
 Subjective:  Patient ID: [name], female    DOB: [birth year],  MRN: [id number]  DOS: 03/15/2023 Procedure: Right ankle ulcer debridement with prep for graft, bone biopsies distal fibula, application of arthroflex dermal allograft  83 y.o. female returns for post-op check.  Following up for ulcers on the left foot.  She reports she bumped the right lateral ankle on a wheelchair as well as having the dishwasher hit the area a separate time and is caused the wound to reopen.  Has been putting Santyl  that she had previously and dry gauze dressing on the area.  Review of Systems: Negative except as noted in the HPI. Denies N/V/F/Ch.   Objective:   Vitals:   11/26/23 1435  BP: (!) 186/73  Pulse: 76    There is no height or weight on file to calculate BMI. Constitutional Well developed. Well nourished.  Vascular Foot warm and well perfused. Capillary refill normal to all digits.  Calf is soft and supple, no posterior calf or knee pain, negative Homans' sign  Neurologic Normal speech. Oriented to person, place, and time. Epicritic sensation to light touch grossly present bilaterally.  Dermatologic Ulceration the right lateral ankle has reoccurred secondary to above trauma noted.  Shallow circular ulceration of the right lateral ankle with fibrotic tissue in wound bed.  Upon debridement of this fibrotic tissue there is healthy wound bed without any significant infection no erythema or malodor Wound probes to subcutaneous fat tissue.  Measures approximately 1 x 1.5 x 0.3 cm postdebridement   Small ulcerations present limited to breakdown of skin at the lateral aspect of the fifth metatarsal head on the left foot.  Pinpoint very small in nature no active drainage or erythema surrounding  Prior wound at the left second toe is now fully healed.     Orthopedic: Tenderness to palpation noted about the surgical site.   Multiple view plain film radiographs: Deferred   Path: Ulceration  sent for pathology showed ulceration with acute inflammation and granulation tissue.  No specific diagnosis given as to any type of abnormal ulceration.  Bone biopsy right ankle distal fibula resulted with benign bone and connective tissue no inflammatory changes were identified.  Culture: Corynebacterium stratum, group A strep Assessment:   1. Ankle ulcer, right, with fat layer exposed (HCC)   2. Ulcer of left foot with fat layer exposed (HCC)   3. Vascular abnormality       Plan:  Patient was evaluated and treated and all questions answered.  # Recurrence of ulceration right lateral ankle -Wound at the distal tuft of the left second toe is now fully healed no wound care needed there -Very superficial pinpoint ulceration lateral aspect fifth metatarsal head continue wound care as below - Recommend continue local wound care with mupirocin  ointment applied to the wounds daily and cover with Band-Aid or gauze dressing - Weightbearing as tolerated in regular shoes - No antibiotics currently indicated  - Follow-up in 3 weeks for wound check to the left foot  S/p foot surgery right ankle ulcer debridement with prep for graft fibular bone biopsy and dermal allograft application - Right lateral ankle has unfortunately reoccurred due to above trauma - Recommend we proceed with local wound care at this time as we had been previously which was working well. - Recommend daily mupirocin  ointment application cover with dry gauze dressing - Monitor off antibiotics at this time no obvious evidence of infection - There is some concern however that as the wound has  recurred so quickly could be deeper infection in the fibula we will continue to monitor  Procedure: Excisional Debridement of Wound Rationale: Removal of non-viable soft tissue from the wound to promote healing.  Anesthesia: None required Pre-Debridement Wound Measurements: 1 cm x 1.5 cm x 0.3 cm  Post-Debridement Wound Measurements: 1 cm  x 1.5 cm x 0.3 cm  Type of Debridement: Excisional Tissue Removed: Non-viable soft tissue Depth of Debridement: Subcutaneous fat tissue Instrumentation: 3-0 mm dermal curette / 15 blade scalpel  Technique: Sharp excisional debridement to bleeding, viable wound base.  Dressing: Dry, sterile, compression dressing. Disposition: Patient tolerated procedure well. Patient to return in 1-2 week for follow-up.           [name], DPM [facility] / [facility]

## 2023-11-29 ENCOUNTER — Telehealth: Payer: Self-pay | Admitting: Cardiovascular Disease

## 2023-11-29 NOTE — Telephone Encounter (Signed)
 Called patient and spouse. Appt requested to discuss lab results and medications changes done by PCP. Appt made and made patient aware that this will be forward to provider for advice.

## 2023-11-29 NOTE — Telephone Encounter (Signed)
 Pt c/o medication issue:  1. Name of Medication:   torsemide  (DEMADEX ) 20 MG tablet  pravastatin  (PRAVACHOL ) 40 MG tablet   2. How are you currently taking this medication (dosage and times per day)?   3. Are you having a reaction (difficulty breathing--STAT)?   4. What is your medication issue?    Husband Vilinda) stated he has questions regarding patient's medication changes and wants a call back to discuss medications and kidney test results.

## 2023-12-04 NOTE — Progress Notes (Signed)
 Patient ID: [name], female   DOB: 10/09/1940, 83 y.o.   MRN: [id number]     83 y.o. 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 She has been asymptomatic and last saw him 05/01/21 LICA 50-69% duplex 03/16/21 GI bleed 02/20/15 transfused 4 units INR over 10 coumadin  changed to eliquis . Had gastric and duodenal ulcers. Chronic lung disease with necrotizing pneumonia moderate COPD sees Ramaschawmy from pulmonary   Still issues with falling Using rolling wheel chair now  Feels her UE tremors are worse  Seen by Dr Tat 12/12/17 ? Decrease dose of amiodarone  furtherOr change eliquis  to pradaxa and try primidone  Also noted left foot drop ? From lumbar radiculopathy  Told not to start Evinity for osteoporosis due to increased risk of MI/Stroke Dr Shona suggested Getting Prolia  now   Avapro  d/c 05/31/20 for elevated K/Cr  1.23/54. Normalized on labs 10/24/20 Primary wanted to start Lisinopril  for proteinuria but not a great idea given above and age   Hospitalized for sepsis 9/17-26/2023 left foot cellulitis with amputation of left great toe She sees Clark/Brabham  VVS stent to Left SFA to improve inflow with popliteal angioplasty  May 2024 had lser atherectomy of right anterior tibial for non healing right ulcer ABI 0.87 Due to have angiogram with ? Right SFA intervention   TTE 05/24/22 EF 40-45% mild/mod MR mild AR AV sclerosis nl RV  Hospitalized [facility] 06/18/22 with sepsis from pneumonia and volume overload with CHF and EF newly decreased 40-45% by TTE see above ECG with atypical flutter Diuresed and no attempts at DCC On low dose eliquis  due to age, weight and CRF Cr 1.43 BNP 2405  She continues to be very tenuous. Back on amiodarone  for flutter  Has f/u with Dr Evonnie  Carmel to use primidone  due to DOAC   She had intervention by Brabhm left SFA 07/16/23 Prior to that has sepsis with right ankle cellulitis RX with IV vancomycin  for 2 weeks and 4  weeks of linezolid . She was admitted with dyspnea 4/16-19. Hb 9.5 BNP 3972. CT with RML pneumonia and increased mediastinal LN. Rx with antibiotics , iv lasix  and d/c home on 2L's oxygen Echo with stable EF 45% 07/25/23 D/c with torsemide  40 mg daily instead of same dose Lasix   She had questions about her demedex/pravastatin  doses changed by primary She appears dry Cr elevatged 2.4 discussed cutting demedex back to once/day   She is DNR    ROS: Denies fever, malais, weight loss, blurry vision, decreased visual acuity, cough, sputum, SOB, hemoptysis, pleuritic pain, palpitaitons, heartburn, abdominal pain, melena, lower extremity edema, claudication, or rash.  All other systems reviewed and negative   General: BP (!) 118/52 (BP Location: Left Arm, Patient Position: Sitting, Cuff Size: Normal)   Pulse (!) 52   Ht 5' 1.5 (1.562 m)   Wt 86 lb (39 kg)   SpO2 94%   BMI 15.99 kg/m  Affect appropriate Frail thin chronically ill white female  HEENT: normal Neck supple with no adenopathy JVP normal bilateral carotid and subclavian  bruits no thyromegaly Lungs clear with no wheezing and good diaphragmatic motion Heart:  S1/S2 no murmur, no rub, gallop or click PMI normal post left mastectomy  Abdomen: benighn, BS positve, no tenderness, no AAA no bruit.  No HSM or HJR Decreased pulse and BP in right arm  No edema Neuro UE tremors left foot drop  Skin bruising over arms  Post recent left great  toe amputation non healing ulcer right lateral malleolus with chronic ischemic changes right foot      Current Outpatient Medications  Medication Sig Dispense Refill   acetaminophen  (TYLENOL ) 325 MG tablet Take 2 tablets (650 mg total) by mouth every 6 (six) hours as needed for mild pain (pain score 1-3) (or Fever >/= 101). 30 tablet 0   AMBULATORY NON FORMULARY MEDICATION Dispense one Readi Steadi glove 1 Units 0   amiodarone  (PACERONE ) 200 MG tablet Take 200 mg by mouth daily.     apixaban   (ELIQUIS ) 2.5 MG TABS tablet Take 1 tablet (2.5 mg total) by mouth 2 (two) times daily. 180 tablet 1   ascorbic acid  (VITAMIN C ) 500 MG tablet Take 500 mg by mouth in the morning.     CALCIUM PO Take 1,200 mg by mouth 2 (two) times daily. 600 mg each     Camphor-Menthol-Methyl Sal (SALONPAS) 3.04-14-08 % PTCH Place 1 patch onto the skin daily as needed (pain.).     clopidogrel  (PLAVIX ) 75 MG tablet Take 1 tablet (75 mg total) by mouth every morning. 90 tablet 3   denosumab  (PROLIA ) 60 MG/ML SOSY injection Inject 60 mg into the skin every 6 (six) months.     Ferrous Sulfate  (IRON PO) Take 65 mg by mouth at bedtime.     levothyroxine (SYNTHROID) 75 MCG tablet Take 75 mcg by mouth daily before breakfast.     MAGNESIUM  OXIDE PO Take 500 mg by mouth every morning.     metoprolol  tartrate (LOPRESSOR ) 25 MG tablet Take 3 tablets (75 mg total) by mouth 2 (two) times daily. 540 tablet 1   Multiple Vitamin (MULTIVITAMIN WITH MINERALS) TABS tablet Take 1 tablet by mouth every morning.     mupirocin  ointment (BACTROBAN ) 2 % Apply 1 Application topically daily. 22 g 0   mupirocin  ointment (BACTROBAN ) 2 % Apply 1 Application topically daily. 22 g 0   pantoprazole  (PROTONIX ) 40 MG tablet TAKE 1 TABLET DAILY 90 tablet 3   potassium chloride  (MICRO-K ) 10 MEQ CR capsule Take 10 mEq by mouth 2 (two) times daily.     pravastatin  (PRAVACHOL ) 40 MG tablet Take 1 tablet (40 mg total) by mouth daily. 90 tablet 1   umeclidinium bromide  (INCRUSE ELLIPTA ) 62.5 MCG/ACT AEPB Inhale 1 puff into the lungs daily. 90 each 3   potassium chloride  (KLOR-CON  M) 10 MEQ tablet Take 2 tablets (20 mEq total) by mouth daily. 180 tablet 3   Torsemide  40 MG TABS Take 40 mg by mouth daily. 90 tablet 3   No current facility-administered medications for this visit.    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 Renal failure precludes any reasonable dose of Tikosyn On amiodarone   lower dose due to tremors   Chol:  On statin.  Pravastatin     HTN:  stable off avapro    Pulmonary:  Quit smoking 12 years ago Gold Class 2 COPD f/u pulmonary Bronchiectasis and frequent pneumonia as well On oxygen 2L's by  [place]  Edema:  improved continue diuretic She is very dry now with azotemia Lasix  previously did not help with her edema. Cut demedex back to 40 mg daily Update labs 9/29 on next office visit  Anemia :  History of gastric ulcers continue protonix  f/u GI  Lab Results  Component Value Date   HCT 27.2 (L) 07/27/2023   CAD:  No chest pain extensive by CT last cath 2011 with no hemodynamically significant disease Continue plavix  not on ASA due to GI bleed and need for eliquis  for PAF  Tremor:  worse on amiodarone  Sees Tat Neurology unable to use primidone  due to eliquis    Vascular:  f/u Brabham left great toe amputation with SFA stenting and Popliteal balloon. Non healing right ulcer for intervention to right SFA 11/19 with Dr Gretta   DCM:  new onset decreased EF 40-45% by TTE 05/24/22 likely worse due to atypical flutter with limited no good options for AAT. Continue lopressor  with 07/26/23 echo showing EF 45% and diuretic changed to demedex from lasix  Decrease dose due to azotemia  Decrease demedex to daily and Kdur to daily BNP/BMET 9/29  F/u with me latter in month as scheduled    [name]

## 2023-12-10 ENCOUNTER — Encounter (HOSPITAL_BASED_OUTPATIENT_CLINIC_OR_DEPARTMENT_OTHER): Payer: Self-pay

## 2023-12-12 ENCOUNTER — Ambulatory Visit: Attending: Cardiovascular Disease | Admitting: Cardiovascular Disease

## 2023-12-12 ENCOUNTER — Other Ambulatory Visit: Payer: Self-pay

## 2023-12-12 VITALS — BP 118/52 | HR 52 | Ht 61.5 in | Wt 86.0 lb

## 2023-12-12 DIAGNOSIS — L97519 Non-pressure chronic ulcer of other part of right foot with unspecified severity: Secondary | ICD-10-CM | POA: Diagnosis not present

## 2023-12-12 DIAGNOSIS — E785 Hyperlipidemia, unspecified: Secondary | ICD-10-CM | POA: Diagnosis not present

## 2023-12-12 DIAGNOSIS — I480 Paroxysmal atrial fibrillation: Secondary | ICD-10-CM | POA: Diagnosis not present

## 2023-12-12 DIAGNOSIS — I5022 Chronic systolic (congestive) heart failure: Secondary | ICD-10-CM

## 2023-12-12 DIAGNOSIS — Z79899 Other long term (current) drug therapy: Secondary | ICD-10-CM

## 2023-12-12 DIAGNOSIS — Z7901 Long term (current) use of anticoagulants: Secondary | ICD-10-CM

## 2023-12-12 MED ORDER — TORSEMIDE 40 MG PO TABS: 40.0000 mg | ORAL_TABLET | Freq: Every day | ORAL | 3 refills | Status: DC

## 2023-12-12 MED ORDER — POTASSIUM CHLORIDE CRYS ER 10 MEQ PO TBCR
20.0000 meq | EXTENDED_RELEASE_TABLET | Freq: Every day | ORAL | 3 refills | Status: AC
Start: 2023-12-12 — End: ?

## 2023-12-12 NOTE — Patient Instructions (Addendum)
 Medication Instructions:  Your physician has recommended you make the following change in your medication:  1-Decrease Demadex  40 mg by mouth daily. 2-Decrease Potassium 20 meq by mouth daily.  *If you need a refill on your cardiac medications before your next appointment, please call your pharmacy*  Lab Work: Your physician recommends that you return for lab work at next visit - BMET If you have labs (blood work) drawn today and your tests are completely normal, you will receive your results only by: MyChart Message (if you have MyChart) OR A paper copy in the mail If you have any lab test that is abnormal or we need to change your treatment, we will call you to review the results.  Testing/Procedures: None ordered today.  Follow-Up: At [facility], you and your health needs are our priority.  As part of our continuing mission to provide you with exceptional heart care, our providers are all part of one team.  This team includes your primary Cardiologist (physician) and Advanced Practice Providers or APPs (Physician Assistants and Nurse Practitioners) who all work together to provide you with the care you need, when you need it.  Your next appointment:   6 month(s)  Provider:   Maude Emmer, MD    We recommend signing up for the patient portal called MyChart.  Sign up information is provided on this After Visit Summary.  MyChart is used to connect with patients for Virtual Visits (Telemedicine).  Patients are able to view lab/test results, encounter notes, upcoming appointments, etc.  Non-urgent messages can be sent to your provider as well.   To learn more about what you can do with MyChart, go to [email or web address].

## 2023-12-13 ENCOUNTER — Encounter: Attending: Internal Medicine | Admitting: Emergency Medicine

## 2023-12-13 VITALS — BP 171/64 | HR 57 | Temp 97.3°F | Resp 16

## 2023-12-13 DIAGNOSIS — M810 Age-related osteoporosis without current pathological fracture: Secondary | ICD-10-CM | POA: Diagnosis not present

## 2023-12-13 MED ORDER — DENOSUMAB 60 MG/ML ~~LOC~~ SOSY
60.0000 mg | PREFILLED_SYRINGE | Freq: Once | SUBCUTANEOUS | Status: AC
  Administered 2023-12-13: 60 mg via SUBCUTANEOUS

## 2023-12-13 NOTE — Progress Notes (Signed)
 Diagnosis: Osteoporosis  Provider:  Izetta Marshall MD  Procedure: Injection  Prolia (Denosumab), Dose: 60 mg, Site: subcutaneous, Number of injections: 1  Injection Site(s): Right arm  Post Care: Patient declined observation  Discharge: Condition: Good, Destination: Home . AVS Provided  Performed by:  Arlina Benjamin, RN

## 2023-12-16 ENCOUNTER — Telehealth: Payer: Self-pay | Admitting: Cardiovascular Disease

## 2023-12-16 DIAGNOSIS — I5033 Acute on chronic diastolic (congestive) heart failure: Secondary | ICD-10-CM

## 2023-12-17 ENCOUNTER — Ambulatory Visit (INDEPENDENT_AMBULATORY_CARE_PROVIDER_SITE_OTHER): Admitting: Podiatry

## 2023-12-17 DIAGNOSIS — L97312 Non-pressure chronic ulcer of right ankle with fat layer exposed: Secondary | ICD-10-CM | POA: Diagnosis not present

## 2023-12-17 NOTE — Progress Notes (Signed)
  Subjective:  Patient ID: ARDINE IACOVELLI, female    DOB: February 26, 1941,  MRN: [id number]  DOS: 03/15/2023 Procedure: Right ankle ulcer debridement with prep for graft, bone biopsies distal fibula, application of arthroflex dermal allograft  83 y.o. female returns for post-op check.   Following up on right ankle ulceration.  Reports there was some redness around the ulceration was slightly improved after she was put on doxycycline  approximately 3 days ago.  Has been doing mupirocin  and dry gauze dressing changes.  Review of Systems: Negative except as noted in the HPI. Denies N/V/F/Ch.   Objective:   There were no vitals filed for this visit.   There is no height or weight on file to calculate BMI. Constitutional Well developed. Well nourished.  Vascular Foot warm and well perfused. Capillary refill normal to all digits.  Calf is soft and supple, no posterior calf or knee pain, negative Homans' sign  Neurologic Normal speech. Oriented to person, place, and time. Epicritic sensation to light touch grossly present bilaterally.  Dermatologic Ulceration the right lateral ankle has improved since prior borders have filled and slightly dried out. measures approximately 1 x 1.2 x 0.3 cm postdebridement      Orthopedic: Tenderness to palpation noted about the surgical site.   Multiple view plain film radiographs: Deferred   Path: Ulceration sent for pathology showed ulceration with acute inflammation and granulation tissue.  No specific diagnosis given as to any type of abnormal ulceration.  Bone biopsy right ankle distal fibula resulted with benign bone and connective tissue no inflammatory changes were identified.  Culture: Corynebacterium stratum, group A strep Assessment:   1. Ankle ulcer, right, with fat layer exposed (HCC)        Plan:  Patient was evaluated and treated and all questions answered.  # Recurrence of ulceration right lateral ankle S/p foot surgery right ankle  ulcer debridement with prep for graft fibular bone biopsy and dermal allograft application - Right lateral ankle has unfortunately reoccurred due to above trauma - Continue with local wound care at this time as we had been previously which was working well. - Recommend daily mupirocin  ointment application cover with dry gauze dressing - Finished her 7 days course of doxycycline  100 mg twice daily and then monitor off antibiotics until next appointment  Procedure: Excisional Debridement of Wound Rationale: Removal of non-viable soft tissue from the wound to promote healing.  Anesthesia: None required Pre-Debridement Wound Measurements: 1 cm x 1.2 cm x 0.3 cm  Post-Debridement Wound Measurements: 1 cm x 1.2 cm x 0.3 cm  Type of Debridement: Excisional Tissue Removed: Non-viable soft tissue Depth of Debridement: Subcutaneous fat tissue Instrumentation: 3-0 mm dermal curette / 15 blade scalpel  Technique: Sharp excisional debridement to bleeding, viable wound base.  Dressing: Dry, sterile, compression dressing. Disposition: Patient tolerated procedure well. Patient to return in 3 week for follow-up.           [name], DPM [facility] / [facility]

## 2023-12-18 ENCOUNTER — Other Ambulatory Visit (HOSPITAL_COMMUNITY): Payer: Self-pay

## 2023-12-18 ENCOUNTER — Telehealth: Payer: Self-pay | Admitting: Pharmacy Technician

## 2023-12-18 ENCOUNTER — Encounter: Payer: Self-pay | Admitting: Internal Medicine

## 2023-12-18 NOTE — Telephone Encounter (Signed)
 Per rx req  So the 40mg  only comes as brand -Soaanz  and that is not covered per insurance. The insurance will pay for 20mg  torsemide  at 2 tablets a day but that is saying it is too soon until 01/06/24 since she just got the 20mg  on 12/14/23 as 1 tablet twice a day    Sent message

## 2023-12-20 NOTE — Telephone Encounter (Signed)
 Called and spoke with patient's husband about changing torsemide  40mg  to 20mg  BID. Husband says that's what he is already giving her so this would not be a change.  Updated rx sent to pharmacy

## 2023-12-24 ENCOUNTER — Telehealth: Payer: Self-pay | Admitting: Podiatry

## 2023-12-24 NOTE — Telephone Encounter (Signed)
 Pt has an appt. is on 9/30 fu appt. Pt would like to come in sooner due to the fact that  two week ago  Dr. Malvin removed   a corn all of a sudden look like a half size of a bullseye perfect circle  Dr.Standiford squeeze it to get out he cord but now it is forming another circle in the middle of the circle Pt is afraid that it might be a abscess Pt concerns are should she continue to put the cream ointment  on it until she comes in on 9/30 or should she come in sooner Pt. scared because it is close to the bone below the little toe it starts with a bullseye and turns into a wound. [phone] This is the # to reach pt it ok to talk whit husband [name]

## 2023-12-24 NOTE — Progress Notes (Signed)
 Patient ID: [name], female   DOB: 11/24/1940, 83 y.o.   MRN: [id number]     83 y.o. 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 She has been asymptomatic and last saw him 05/01/21 LICA 50-69% duplex 03/16/21 GI bleed 02/20/15 transfused 4 units INR over 10 coumadin  changed to eliquis . Had gastric and duodenal ulcers. Chronic lung disease with necrotizing pneumonia moderate COPD sees Ramaschawmy from pulmonary   Still issues with falling Using rolling wheel chair now  Feels her UE tremors are worse  Seen by Dr Tat 12/12/17 ? Decrease dose of amiodarone  furtherOr change eliquis  to pradaxa and try primidone  Also noted left foot drop ? From lumbar radiculopathy  Told not to start Evinity for osteoporosis due to increased risk of MI/Stroke Dr Shona suggested Getting Prolia  now   Avapro  d/c 05/31/20 for elevated K/Cr  1.23/54. Normalized on labs 10/24/20 Primary wanted to start Lisinopril  for proteinuria but not a great idea given above and age   Hospitalized for sepsis 9/17-26/2023 left foot cellulitis with amputation of left great toe She sees Clark/Brabham  [facility]VS stent to Left SFA to improve inflow with popliteal angioplasty  May 2024 had lser atherectomy of right anterior tibial for non healing right ulcer ABI 0.87 Due to have angiogram with ? Right SFA intervention   TTE 05/24/22 EF 40-45% mild/mod MR mild AR AV sclerosis nl RV  Hospitalized [facility] 06/18/22 with sepsis from pneumonia and volume overload with CHF and EF newly decreased 40-45% by TTE see above ECG with atypical flutter Diuresed and no attempts at DCC On low dose eliquis  due to age, weight and CRF Cr 1.43 BNP 2405  She continues to be very tenuous. Back on amiodarone  for flutter  Has f/u with Dr Evonnie  Carmel to use primidone  due to DOAC   She had intervention by Brabhm left SFA 07/16/23 Prior to that has sepsis with right ankle cellulitis RX with IV vancomycin  for 2 weeks and 4  weeks of linezolid . She was admitted with dyspnea 4/16-19. Hb 9.5 BNP 3972. CT with RML pneumonia and increased mediastinal LN. Rx with antibiotics , iv lasix  and d/c home on 2L's oxygen Echo with stable EF 45% 07/25/23 D/c with torsemide  40 mg daily instead of same dose Lasix   She had questions about her demedex/pravastatin  doses changed by primary She appears dry Cr elevatged 2.4 discussed cutting demedex back to once/day on 12/12/23 She is due for updated BMET on lower dose  Having issues with right ankle ulcer post debridement and Rx with doxycyline by podiatry Recent corn removal.  On left and ulcer Rx wrapped over right ankle  She is DNR     ROS: Denies fever, malais, weight loss, blurry vision, decreased visual acuity, cough, sputum, SOB, hemoptysis, pleuritic pain, palpitaitons, heartburn, abdominal pain, melena, lower extremity edema, claudication, or rash.  All other systems reviewed and negative   General: There were no vitals taken for this visit. Affect appropriate Frail thin chronically ill white female  HEENT: normal Neck supple with no adenopathy JVP normal bilateral carotid and subclavian  bruits no thyromegaly Lungs clear with no wheezing and good diaphragmatic motion Heart:  S1/S2 no murmur, no rub, gallop or click PMI normal post left mastectomy  Abdomen: benighn, BS positve, no tenderness, no AAA no bruit.  No HSM or HJR Decreased pulse and BP in right arm  No edema Neuro UE tremors left foot drop  Skin bruising over arms  Post left great toe amputation non healing ulcer right lateral malleolus with chronic ischemic changes right foot      Current Outpatient Medications  Medication Sig Dispense Refill   acetaminophen  (TYLENOL ) 325 MG tablet Take 2 tablets (650 mg total) by mouth every 6 (six) hours as needed for mild pain (pain score 1-3) (or Fever >/= 101). 30 tablet 0   AMBULATORY NON FORMULARY MEDICATION Dispense one Readi Steadi glove 1 Units 0   amiodarone   (PACERONE ) 200 MG tablet Take 200 mg by mouth daily.     apixaban  (ELIQUIS ) 2.5 MG TABS tablet Take 1 tablet (2.5 mg total) by mouth 2 (two) times daily. 180 tablet 1   ascorbic acid  (VITAMIN C ) 500 MG tablet Take 500 mg by mouth in the morning.     CALCIUM PO Take 1,200 mg by mouth 2 (two) times daily. 600 mg each     Camphor-Menthol-Methyl Sal (SALONPAS) 3.04-14-08 % PTCH Place 1 patch onto the skin daily as needed (pain.).     clopidogrel  (PLAVIX ) 75 MG tablet Take 1 tablet (75 mg total) by mouth every morning. 90 tablet 3   denosumab  (PROLIA ) 60 MG/ML SOSY injection Inject 60 mg into the skin every 6 (six) months.     Ferrous Sulfate  (IRON PO) Take 65 mg by mouth at bedtime.     gabapentin  (NEURONTIN ) 100 MG capsule Take 1 capsule (100 mg total) by mouth 3 (three) times daily. 90 capsule 3   levothyroxine (SYNTHROID) 75 MCG tablet Take 75 mcg by mouth daily before breakfast.     MAGNESIUM  OXIDE PO Take 500 mg by mouth every morning.     metoprolol  tartrate (LOPRESSOR ) 25 MG tablet Take 3 tablets (75 mg total) by mouth 2 (two) times daily. 540 tablet 1   Multiple Vitamin (MULTIVITAMIN WITH MINERALS) TABS tablet Take 1 tablet by mouth every morning.     mupirocin  ointment (BACTROBAN ) 2 % Apply 1 Application topically daily. 22 g 0   mupirocin  ointment (BACTROBAN ) 2 % Apply 1 Application topically daily. 22 g 0   pantoprazole  (PROTONIX ) 40 MG tablet TAKE 1 TABLET DAILY 90 tablet 3   potassium chloride  (KLOR-CON  M) 10 MEQ tablet Take 2 tablets (20 mEq total) by mouth daily. 180 tablet 3   pravastatin  (PRAVACHOL ) 40 MG tablet Take 1 tablet (40 mg total) by mouth daily. 90 tablet 1   torsemide  (DEMADEX ) 20 MG tablet Take 1 tablet (20 mg total) by mouth 2 (two) times daily. 180 tablet 1   umeclidinium bromide  (INCRUSE ELLIPTA ) 62.5 MCG/ACT AEPB Inhale 1 puff into the lungs daily. 90 each 3   doxycycline  (VIBRA -TABS) 100 MG tablet Take 100 mg by mouth 2 (two) times daily. (Patient not taking: Reported  on 12/31/2023)     levothyroxine (SYNTHROID) 75 MCG tablet Take 75 mcg by mouth daily before breakfast.     potassium chloride  (KLOR-CON  M10) 10 MEQ tablet Take 10 mEq by mouth 2 (two) times daily.     potassium chloride  (MICRO-K ) 10 MEQ CR capsule Take 10 mEq by mouth 2 (two) times daily.     No current facility-administered medications for this visit.    Allergies  Iodine , Iohexol , Petroleum gauze non-woven 3x9 [wound dressings], Sulfa  antibiotics, Wound dressing adhesive, and Tape  Electrocardiogram:  11/18/19 Afib rate 100 nonspecific ST changes   Assessment and Plan Vascular: right innominate occlusion with moderate LICA stenosis f/u duplex ordered  f/u Brabham 5/89/75 40-59% LICA stenosis   PAF:  Continue eliquis  lower to  2.5 bid if any further bleeding issues She has had more recent refractory flutter She is not a candidate for Multaq due to CHF and decreased EF and CAD not a candidate for flecainide or propofonone. Renal failure precludes any reasonable dose of Tikosyn On amiodarone   lower dose due to tremors   Chol:  On statin.  Pravastatin     HTN:  stable off avapro    Pulmonary:  Quit smoking 12 years ago Gold Class 2 COPD f/u pulmonary Bronchiectasis and frequent pneumonia as well On oxygen 2L's by   Edema:  improved continue diuretic She is very dry now with azotemia Lasix  previously did not help with her edema. Cut demedex back to 40 mg daily Update labs 9/29 on next office visit  Anemia :  History of gastric ulcers continue protonix  f/u GI  Lab Results  Component Value Date   HCT 27.2 (L) 07/27/2023   CAD:  No chest pain extensive by CT last cath 2011 with no hemodynamically significant disease Continue plavix  not on ASA due to GI bleed and need for eliquis  for PAF  Tremor:  worse on amiodarone  Sees Tat Neurology unable to use primidone  due to eliquis    Vascular:  f/u Brabham left great toe amputation with SFA stenting and Popliteal balloon. Non healing right ulcer  for intervention to right SFA 11/19 with Dr Gretta Seeing podiatry as well Dr Malvin   DCM:  new onset decreased EF 40-45% by TTE 05/24/22 likely worse due to atypical flutter with limited no good options for AAT. Continue lopressor  with 07/26/23 echo showing EF 45% and diuretic changed to demedex from lasix  Decrease dose due to azotemia Given insurance issues use 20 mg tabs and can take together or bid  BNP/BMET 9/29  F/u with me latter in month as scheduled    Maude Emmer

## 2023-12-31 ENCOUNTER — Ambulatory Visit (INDEPENDENT_AMBULATORY_CARE_PROVIDER_SITE_OTHER): Admitting: Podiatry

## 2023-12-31 ENCOUNTER — Ambulatory Visit (INDEPENDENT_AMBULATORY_CARE_PROVIDER_SITE_OTHER)

## 2023-12-31 ENCOUNTER — Ambulatory Visit

## 2023-12-31 ENCOUNTER — Encounter: Payer: Self-pay | Admitting: Podiatry

## 2023-12-31 DIAGNOSIS — L97522 Non-pressure chronic ulcer of other part of left foot with fat layer exposed: Secondary | ICD-10-CM

## 2023-12-31 DIAGNOSIS — L97312 Non-pressure chronic ulcer of right ankle with fat layer exposed: Secondary | ICD-10-CM | POA: Diagnosis not present

## 2023-12-31 DIAGNOSIS — M792 Neuralgia and neuritis, unspecified: Secondary | ICD-10-CM | POA: Diagnosis not present

## 2023-12-31 MED ORDER — GABAPENTIN 100 MG PO CAPS: 100.0000 mg | ORAL_CAPSULE | Freq: Three times a day (TID) | ORAL | 3 refills | Status: DC

## 2023-12-31 NOTE — Progress Notes (Signed)
  Subjective:  Patient ID: [name], female    DOB: 1941/01/30,  MRN: [id number]  DOS: 03/15/2023 Procedure: Right ankle ulcer debridement with prep for graft, bone biopsies distal fibula, application of arthroflex dermal allograft  83 y.o. female returns for post-op check.   Following up on right ankle ulceration.  Also following up on left foot fifth metatarsal head ulceration laterally.  Reports some burning pain in both feet.  Review of Systems: Negative except as noted in the HPI. Denies N/V/F/Ch.   Objective:   There were no vitals filed for this visit.   There is no height or weight on file to calculate BMI. Constitutional Well developed. Well nourished.  Vascular Foot warm and well perfused. Capillary refill normal to all digits.  Calf is soft and supple, no posterior calf or knee pain, negative Homans' sign  Neurologic Normal speech. Oriented to person, place, and time. Epicritic sensation to light touch grossly present bilaterally.  Dermatologic Ulceration the right lateral ankle has improved with decreased maceration decrease erythema and healthy granular wound bed no drainage  Left foot lateral aspect fifth metatarsal head there is a small black eschar overlying ulceration unstageable depth of the lateral aspect of the fifth metatarsal head no drainage          Orthopedic: Tenderness to palpation noted about the surgical site.   Multiple view plain film radiographs:  XR 3 views AP lateral oblique left foot 12/31/2023: No evidence of osseous erosion of the lateral aspect of the fifth metatarsal head  XR 2 views AP and lateral right ankle 12/31/2023: No evidence of significant erosions of the distal fibula laterally   Path: Ulceration sent for pathology showed ulceration with acute inflammation and granulation tissue.  No specific diagnosis given as to any type of abnormal ulceration.  Bone biopsy right ankle distal fibula resulted with benign bone and  connective tissue no inflammatory changes were identified.  Culture: Corynebacterium stratum, group A strep Assessment:   1. Ankle ulcer, right, with fat layer exposed (HCC)   2. Ulcer of left foot with fat layer exposed (HCC)   3. Neuropathic pain         Plan:  Patient was evaluated and treated and all questions answered.  # Recurrence of ulceration right lateral ankle S/p foot surgery right ankle ulcer debridement with prep for graft fibular bone biopsy and dermal allograft application - Appears slightly improved today, no debridement indicated today - Continue with local wound care at this time as we had been previously which was working well. - Recommend daily mupirocin  ointment application cover with dry gauze dressing -X-ray obtained did not show evidence of erosion of the lateral aspect of the fibula on the right ankle - No indication for antibiotics   # Ulceration of the left lateral fifth metatarsal head - X-ray obtained did not show any evidence of the erosions of the fifth metatarsal head laterally - Continue to monitor with small amount of antibiotic ointment and Band-Aid applied daily  # Nerve pain - Discussed her burning pain in both feet is likely related to neuropathy - E Rx for gabapentin  100 mg 3 times daily for 90 days        [name], DPM [facility] / [facility]

## 2024-01-01 ENCOUNTER — Telehealth: Payer: Self-pay | Admitting: Pharmacy Technician

## 2024-01-01 NOTE — Telephone Encounter (Signed)
 Spoke to patients husband. He says that he just got off the phone with [employer]'s Prior Auth department and was told that they will approve the 40mg  if the doctor states that there is a need for it. He says that he thinks that Sonaaz is being used instead of Torsemide  because it does not affect the kidneys as much.

## 2024-01-01 NOTE — Telephone Encounter (Signed)
  I called the husband and he said he was told they were changing her to sooanz since that would not have any effects on her kidneys. He said he was told the torsemide  was having effect on her kidneys. If this is true, Can this be noted somewhere and then we can send to the insurance to see if this can be approved. Thank you

## 2024-01-03 NOTE — Telephone Encounter (Signed)
 Spoke with pt, her husband is not at home and she does not know of any issues with her medications. Left message for husband to call if he still has questions. She has an appointment Monday.

## 2024-01-06 ENCOUNTER — Encounter: Payer: Self-pay | Admitting: Cardiovascular Disease

## 2024-01-06 ENCOUNTER — Ambulatory Visit: Attending: Cardiovascular Disease | Admitting: Cardiovascular Disease

## 2024-01-06 VITALS — BP 183/75 | HR 56 | Wt 85.0 lb

## 2024-01-06 DIAGNOSIS — I70223 Atherosclerosis of native arteries of extremities with rest pain, bilateral legs: Secondary | ICD-10-CM

## 2024-01-06 DIAGNOSIS — I10 Essential (primary) hypertension: Secondary | ICD-10-CM

## 2024-01-06 DIAGNOSIS — R0989 Other specified symptoms and signs involving the circulatory and respiratory systems: Secondary | ICD-10-CM

## 2024-01-06 DIAGNOSIS — I484 Atypical atrial flutter: Secondary | ICD-10-CM

## 2024-01-06 DIAGNOSIS — I5022 Chronic systolic (congestive) heart failure: Secondary | ICD-10-CM

## 2024-01-06 DIAGNOSIS — R251 Tremor, unspecified: Secondary | ICD-10-CM

## 2024-01-06 LAB — BASIC METABOLIC PANEL WITH GFR
BUN/Creatinine Ratio: 28 (ref 12–28)
BUN: 49 mg/dL — ABNORMAL HIGH (ref 8–27)
CO2: 20 mmol/L (ref 20–29)
Calcium: 10 mg/dL (ref 8.7–10.3)
Chloride: 99 mmol/L (ref 96–106)
Creatinine, Ser: 1.77 mg/dL — ABNORMAL HIGH (ref 0.57–1.00)
Glucose: 85 mg/dL (ref 70–99)
Potassium: 4.6 mmol/L (ref 3.5–5.2)
Sodium: 139 mmol/L (ref 134–144)
eGFR: 28 mL/min/1.73 — ABNORMAL LOW (ref >59)

## 2024-01-06 NOTE — Patient Instructions (Signed)
 Medication Instructions:  The current medical regimen is effective;  continue present plan and medications. (Including Demadex  20 mg one tablet twice a day)  *If you need a refill on your cardiac medications before your next appointment, please call your pharmacy*  Lab Work: Please have blood work today as ordered (BMP)  If you have labs (blood work) drawn today and your tests are completely normal, you will receive your results only by: MyChart Message (if you have MyChart) OR A paper copy in the mail If you have any lab test that is abnormal or we need to change your treatment, we will call you to review the results.  Follow-Up: At [facility], you and your health needs are our priority.  As part of our continuing mission to provide you with exceptional heart care, our providers are all part of one team.  This team includes your primary Cardiologist (physician) and Advanced Practice Providers or APPs (Physician Assistants and Nurse Practitioners) who all work together to provide you with the care you need, when you need it.  Your next appointment:   6 month(s)  Provider:   Maude Emmer, MD    We recommend signing up for the patient portal called MyChart.  Sign up information is provided on this After Visit Summary.  MyChart is used to connect with patients for Virtual Visits (Telemedicine).  Patients are able to view lab/test results, encounter notes, upcoming appointments, etc.  Non-urgent messages can be sent to your provider as well.   To learn more about what you can do with MyChart, go to [email or web address].

## 2024-01-06 NOTE — Telephone Encounter (Signed)
 Per Dr Delford, 20mg  BID is fine. No need to pursue PA

## 2024-01-07 ENCOUNTER — Ambulatory Visit: Admitting: Podiatry

## 2024-01-07 ENCOUNTER — Ambulatory Visit: Payer: Self-pay | Admitting: Cardiovascular Disease

## 2024-01-14 ENCOUNTER — Ambulatory Visit: Admitting: Podiatry

## 2024-01-14 DIAGNOSIS — L97312 Non-pressure chronic ulcer of right ankle with fat layer exposed: Secondary | ICD-10-CM

## 2024-01-14 DIAGNOSIS — L97522 Non-pressure chronic ulcer of other part of left foot with fat layer exposed: Secondary | ICD-10-CM

## 2024-01-14 DIAGNOSIS — M792 Neuralgia and neuritis, unspecified: Secondary | ICD-10-CM

## 2024-01-14 NOTE — Progress Notes (Signed)
  Subjective:  Patient ID: [name], female    DOB: 1940/10/22,  MRN: [id number]  DOS: 03/15/2023 Procedure: Right ankle ulcer debridement with prep for graft, bone biopsies distal fibula, application of arthroflex dermal allograft  83 y.o. female returns for post-op check.   Following up on right ankle ulceration.  Also following up on left foot fifth metatarsal head ulceration laterally.  Reports some burning pain in both feet.  No changes since last visit overall doing well  Review of Systems: Negative except as noted in the HPI. Denies N/V/F/Ch.   Objective:   There were no vitals filed for this visit.   There is no height or weight on file to calculate BMI. Constitutional Well developed. Well nourished.  Vascular Foot warm and well perfused. Capillary refill normal to all digits.  Calf is soft and supple, no posterior calf or knee pain, negative Homans' sign  Neurologic Normal speech. Oriented to person, place, and time. Epicritic sensation to light touch grossly present bilaterally.  Dermatologic Ulceration the right lateral ankle has improved with decreased maceration decrease erythema and healthy granular wound bed no drainage    Left foot lateral aspect fifth metatarsal head there is a small black eschar overlying ulceration unstageable depth of the lateral aspect of the fifth metatarsal head no drainage     Orthopedic: Tenderness to palpation noted about the surgical site.   Multiple view plain film radiographs:  XR 3 views AP lateral oblique left foot 12/31/2023: No evidence of osseous erosion of the lateral aspect of the fifth metatarsal head  XR 2 views AP and lateral right ankle 12/31/2023: No evidence of significant erosions of the distal fibula laterally   Path: Ulceration sent for pathology showed ulceration with acute inflammation and granulation tissue.  No specific diagnosis given as to any type of abnormal ulceration.  Bone biopsy right ankle distal  fibula resulted with benign bone and connective tissue no inflammatory changes were identified.  Culture: Corynebacterium stratum, group A strep Assessment:   1. Ankle ulcer, right, with fat layer exposed (HCC)   2. Ulcer of left foot with fat layer exposed (HCC)   3. Neuropathic pain   4. Post-operative state          Plan:  Patient was evaluated and treated and all questions answered.  # Recurrence of ulceration right lateral ankle S/p foot surgery right ankle ulcer debridement with prep for graft fibular bone biopsy and dermal allograft application - Appears slightly improved today, no debridement indicated today - Continue with local wound care at this time as we had been previously which was working well. - Recommend daily mupirocin  ointment application cover with dry gauze dressing -X-ray obtained did not show evidence of erosion of the lateral aspect of the fibula on the right ankle - No indication for antibiotics   # Ulceration of the left lateral fifth metatarsal head - X-ray obtained did not show any evidence of the erosions of the fifth metatarsal head laterally - Continue to monitor with small amount of antibiotic ointment and Band-Aid applied daily  # Nerve pain - Discussed her burning pain in both feet is likely related to neuropathy - E Rx for gabapentin  100 mg 3 times daily for 90 days        [name], DPM [facility] / [facility]

## 2024-01-18 ENCOUNTER — Other Ambulatory Visit: Payer: Self-pay

## 2024-01-18 ENCOUNTER — Emergency Department (HOSPITAL_COMMUNITY)

## 2024-01-18 ENCOUNTER — Encounter (HOSPITAL_COMMUNITY): Payer: Self-pay

## 2024-01-18 ENCOUNTER — Inpatient Hospital Stay (HOSPITAL_COMMUNITY)
Admission: EM | Admit: 2024-01-18 | Discharge: 2024-01-22 | DRG: 279 | Disposition: A | Discharge status: Home Health | Attending: Hospitalist | Admitting: Hospitalist

## 2024-01-18 DIAGNOSIS — J449 Chronic obstructive pulmonary disease, unspecified: Secondary | ICD-10-CM | POA: Diagnosis present

## 2024-01-18 DIAGNOSIS — M79604 Pain in right leg: Principal | ICD-10-CM

## 2024-01-18 DIAGNOSIS — Z87891 Personal history of nicotine dependence: Secondary | ICD-10-CM

## 2024-01-18 DIAGNOSIS — Z7951 Long term (current) use of inhaled steroids: Secondary | ICD-10-CM

## 2024-01-18 DIAGNOSIS — E785 Hyperlipidemia, unspecified: Secondary | ICD-10-CM | POA: Diagnosis present

## 2024-01-18 DIAGNOSIS — I7092 Chronic total occlusion of artery of the extremities: Secondary | ICD-10-CM | POA: Diagnosis not present

## 2024-01-18 DIAGNOSIS — D631 Anemia in chronic kidney disease: Secondary | ICD-10-CM | POA: Diagnosis present

## 2024-01-18 DIAGNOSIS — I129 Hypertensive chronic kidney disease with stage 1 through stage 4 chronic kidney disease, or unspecified chronic kidney disease: Secondary | ICD-10-CM | POA: Diagnosis present

## 2024-01-18 DIAGNOSIS — I70222 Atherosclerosis of native arteries of extremities with rest pain, left leg: Secondary | ICD-10-CM | POA: Diagnosis present

## 2024-01-18 DIAGNOSIS — Z7901 Long term (current) use of anticoagulants: Secondary | ICD-10-CM

## 2024-01-18 DIAGNOSIS — S81802A Unspecified open wound, left lower leg, initial encounter: Secondary | ICD-10-CM | POA: Diagnosis not present

## 2024-01-18 DIAGNOSIS — E7800 Pure hypercholesterolemia, unspecified: Secondary | ICD-10-CM | POA: Diagnosis present

## 2024-01-18 DIAGNOSIS — Y832 Surgical operation with anastomosis, bypass or graft as the cause of abnormal reaction of the patient, or of later complication, without mention of misadventure at the time of the procedure: Secondary | ICD-10-CM | POA: Diagnosis present

## 2024-01-18 DIAGNOSIS — M810 Age-related osteoporosis without current pathological fracture: Secondary | ICD-10-CM | POA: Diagnosis present

## 2024-01-18 DIAGNOSIS — T82856A Stenosis of peripheral vascular stent, initial encounter: Principal | ICD-10-CM | POA: Diagnosis present

## 2024-01-18 DIAGNOSIS — Z8709 Personal history of other diseases of the respiratory system: Secondary | ICD-10-CM

## 2024-01-18 DIAGNOSIS — Z79890 Hormone replacement therapy (postmenopausal): Secondary | ICD-10-CM

## 2024-01-18 DIAGNOSIS — E875 Hyperkalemia: Secondary | ICD-10-CM | POA: Diagnosis present

## 2024-01-18 DIAGNOSIS — Z7401 Bed confinement status: Secondary | ICD-10-CM | POA: Diagnosis not present

## 2024-01-18 DIAGNOSIS — Z8249 Family history of ischemic heart disease and other diseases of the circulatory system: Secondary | ICD-10-CM | POA: Diagnosis not present

## 2024-01-18 DIAGNOSIS — R001 Bradycardia, unspecified: Secondary | ICD-10-CM | POA: Diagnosis not present

## 2024-01-18 DIAGNOSIS — Z820 Family history of epilepsy and other diseases of the nervous system: Secondary | ICD-10-CM

## 2024-01-18 DIAGNOSIS — Z83438 Family history of other disorder of lipoprotein metabolism and other lipidemia: Secondary | ICD-10-CM

## 2024-01-18 DIAGNOSIS — I10 Essential (primary) hypertension: Secondary | ICD-10-CM | POA: Diagnosis not present

## 2024-01-18 DIAGNOSIS — N179 Acute kidney failure, unspecified: Secondary | ICD-10-CM | POA: Diagnosis present

## 2024-01-18 DIAGNOSIS — Z91041 Radiographic dye allergy status: Secondary | ICD-10-CM

## 2024-01-18 DIAGNOSIS — I2510 Atherosclerotic heart disease of native coronary artery without angina pectoris: Secondary | ICD-10-CM | POA: Diagnosis present

## 2024-01-18 DIAGNOSIS — I4891 Unspecified atrial fibrillation: Secondary | ICD-10-CM | POA: Diagnosis not present

## 2024-01-18 DIAGNOSIS — I6529 Occlusion and stenosis of unspecified carotid artery: Secondary | ICD-10-CM | POA: Diagnosis not present

## 2024-01-18 DIAGNOSIS — Z853 Personal history of malignant neoplasm of breast: Secondary | ICD-10-CM | POA: Diagnosis not present

## 2024-01-18 DIAGNOSIS — N189 Chronic kidney disease, unspecified: Secondary | ICD-10-CM | POA: Diagnosis not present

## 2024-01-18 DIAGNOSIS — R0902 Hypoxemia: Secondary | ICD-10-CM | POA: Diagnosis not present

## 2024-01-18 DIAGNOSIS — Z89422 Acquired absence of other left toe(s): Secondary | ICD-10-CM

## 2024-01-18 DIAGNOSIS — Z79899 Other long term (current) drug therapy: Secondary | ICD-10-CM

## 2024-01-18 DIAGNOSIS — I160 Hypertensive urgency: Secondary | ICD-10-CM | POA: Diagnosis present

## 2024-01-18 DIAGNOSIS — R636 Underweight: Secondary | ICD-10-CM | POA: Diagnosis present

## 2024-01-18 DIAGNOSIS — Z7902 Long term (current) use of antithrombotics/antiplatelets: Secondary | ICD-10-CM

## 2024-01-18 DIAGNOSIS — E7849 Other hyperlipidemia: Secondary | ICD-10-CM | POA: Diagnosis not present

## 2024-01-18 DIAGNOSIS — I480 Paroxysmal atrial fibrillation: Secondary | ICD-10-CM | POA: Diagnosis present

## 2024-01-18 DIAGNOSIS — Z9221 Personal history of antineoplastic chemotherapy: Secondary | ICD-10-CM

## 2024-01-18 DIAGNOSIS — L97329 Non-pressure chronic ulcer of left ankle with unspecified severity: Secondary | ICD-10-CM | POA: Diagnosis not present

## 2024-01-18 DIAGNOSIS — M79605 Pain in left leg: Secondary | ICD-10-CM | POA: Diagnosis not present

## 2024-01-18 DIAGNOSIS — E039 Hypothyroidism, unspecified: Secondary | ICD-10-CM | POA: Diagnosis present

## 2024-01-18 DIAGNOSIS — I70243 Atherosclerosis of native arteries of left leg with ulceration of ankle: Secondary | ICD-10-CM | POA: Diagnosis not present

## 2024-01-18 DIAGNOSIS — Z9012 Acquired absence of left breast and nipple: Secondary | ICD-10-CM

## 2024-01-18 DIAGNOSIS — N1832 Chronic kidney disease, stage 3b: Secondary | ICD-10-CM | POA: Diagnosis present

## 2024-01-18 DIAGNOSIS — I70202 Unspecified atherosclerosis of native arteries of extremities, left leg: Secondary | ICD-10-CM | POA: Diagnosis not present

## 2024-01-18 DIAGNOSIS — Z91048 Other nonmedicinal substance allergy status: Secondary | ICD-10-CM

## 2024-01-18 DIAGNOSIS — Z8673 Personal history of transient ischemic attack (TIA), and cerebral infarction without residual deficits: Secondary | ICD-10-CM | POA: Diagnosis not present

## 2024-01-18 DIAGNOSIS — I739 Peripheral vascular disease, unspecified: Principal | ICD-10-CM | POA: Diagnosis present

## 2024-01-18 DIAGNOSIS — Z23 Encounter for immunization: Secondary | ICD-10-CM

## 2024-01-18 DIAGNOSIS — Z882 Allergy status to sulfonamides status: Secondary | ICD-10-CM

## 2024-01-18 DIAGNOSIS — Z48812 Encounter for surgical aftercare following surgery on the circulatory system: Secondary | ICD-10-CM | POA: Diagnosis not present

## 2024-01-18 DIAGNOSIS — D72829 Elevated white blood cell count, unspecified: Secondary | ICD-10-CM | POA: Diagnosis present

## 2024-01-18 DIAGNOSIS — Z95820 Peripheral vascular angioplasty status with implants and grafts: Secondary | ICD-10-CM | POA: Diagnosis not present

## 2024-01-18 LAB — CBC WITH DIFFERENTIAL/PLATELET
Abs Immature Granulocytes: 0.04 K/uL (ref 0.00–0.07)
Basophils Absolute: 0.1 K/uL (ref 0.0–0.1)
Basophils Relative: 0 %
Eosinophils Absolute: 0 K/uL (ref 0.0–0.5)
Eosinophils Relative: 0 %
HCT: 36.7 % (ref 36.0–46.0)
Hemoglobin: 12.1 g/dL (ref 12.0–15.0)
Immature Granulocytes: 0 %
Lymphocytes Relative: 6 %
Lymphs Abs: 0.8 K/uL (ref 0.7–4.0)
MCH: 32.8 pg (ref 26.0–34.0)
MCHC: 33 g/dL (ref 30.0–36.0)
MCV: 99.5 fL (ref 80.0–100.0)
Monocytes Absolute: 1.7 K/uL — ABNORMAL HIGH (ref 0.1–1.0)
Monocytes Relative: 13 %
Neutro Abs: 10.8 K/uL — ABNORMAL HIGH (ref 1.7–7.7)
Neutrophils Relative %: 81 %
Platelets: 357 K/uL (ref 150–400)
RBC: 3.69 MIL/uL — ABNORMAL LOW (ref 3.87–5.11)
RDW: 16.2 % — ABNORMAL HIGH (ref 11.5–15.5)
WBC: 13.5 K/uL — ABNORMAL HIGH (ref 4.0–10.5)
nRBC: 0 % (ref 0.0–0.2)

## 2024-01-18 LAB — BASIC METABOLIC PANEL WITH GFR
Anion gap: 14 (ref 5–15)
BUN: 51 mg/dL — ABNORMAL HIGH (ref 8–23)
CO2: 32 mmol/L (ref 22–32)
Calcium: 11.4 mg/dL — ABNORMAL HIGH (ref 8.9–10.3)
Chloride: 96 mmol/L — ABNORMAL LOW (ref 98–111)
Creatinine, Ser: 1.94 mg/dL — ABNORMAL HIGH (ref 0.44–1.00)
GFR, Estimated: 25 mL/min — ABNORMAL LOW (ref >60)
Glucose, Bld: 95 mg/dL (ref 70–99)
Potassium: 5.4 mmol/L — ABNORMAL HIGH (ref 3.5–5.1)
Sodium: 141 mmol/L (ref 135–145)

## 2024-01-18 MED ORDER — FENTANYL CITRATE (PF) 100 MCG/2ML IJ SOLN
25.0000 ug | Freq: Once | INTRAMUSCULAR | Status: AC
  Administered 2024-01-18: 25 ug via INTRAVENOUS
  Filled 2024-01-18: qty 2

## 2024-01-18 MED ORDER — TORSEMIDE 20 MG PO TABS: 20.0000 mg | ORAL_TABLET | Freq: Every day | ORAL | Status: DC

## 2024-01-18 MED ORDER — PANTOPRAZOLE SODIUM 40 MG PO TBEC
40.0000 mg | DELAYED_RELEASE_TABLET | Freq: Every day | ORAL | Status: DC
  Administered 2024-01-19 – 2024-01-22 (×4): 40 mg via ORAL
  Filled 2024-01-18 (×4): qty 1

## 2024-01-18 MED ORDER — ONDANSETRON HCL 4 MG/2ML IJ SOLN
4.0000 mg | Freq: Four times a day (QID) | INTRAMUSCULAR | Status: DC | PRN
  Administered 2024-01-19: 4 mg via INTRAVENOUS
  Filled 2024-01-18: qty 2

## 2024-01-18 MED ORDER — LACTATED RINGERS IV SOLN: INTRAVENOUS | Status: DC

## 2024-01-18 MED ORDER — DOCUSATE SODIUM 100 MG PO CAPS
100.0000 mg | ORAL_CAPSULE | Freq: Two times a day (BID) | ORAL | Status: DC
  Administered 2024-01-18 – 2024-01-22 (×8): 100 mg via ORAL
  Filled 2024-01-18 (×8): qty 1

## 2024-01-18 MED ORDER — DEXTROSE IN LACTATED RINGERS 5 % IV SOLN: INTRAVENOUS | Status: AC

## 2024-01-18 MED ORDER — METOPROLOL TARTRATE 25 MG PO TABS: 75.0000 mg | ORAL_TABLET | Freq: Two times a day (BID) | ORAL | Status: DC

## 2024-01-18 MED ORDER — UMECLIDINIUM BROMIDE 62.5 MCG/ACT IN AEPB
1.0000 | INHALATION_SPRAY | Freq: Every day | RESPIRATORY_TRACT | Status: DC
  Administered 2024-01-19 – 2024-01-22 (×3): 1 via RESPIRATORY_TRACT
  Filled 2024-01-18 (×2): qty 7

## 2024-01-18 MED ORDER — HEPARIN (PORCINE) 25000 UT/250ML-% IV SOLN
600.0000 [IU]/h | INTRAVENOUS | Status: DC
  Administered 2024-01-18 – 2024-01-20 (×2): 600 [IU]/h via INTRAVENOUS
  Filled 2024-01-18 (×2): qty 250

## 2024-01-18 MED ORDER — AMIODARONE HCL 200 MG PO TABS
200.0000 mg | ORAL_TABLET | Freq: Every morning | ORAL | Status: DC
  Administered 2024-01-19 – 2024-01-22 (×4): 200 mg via ORAL
  Filled 2024-01-18 (×4): qty 1

## 2024-01-18 MED ORDER — ACETAMINOPHEN 650 MG RE SUPP: 650.0000 mg | Freq: Four times a day (QID) | RECTAL | Status: DC | PRN

## 2024-01-18 MED ORDER — HYDROMORPHONE HCL 1 MG/ML IJ SOLN
0.5000 mg | INTRAMUSCULAR | Status: DC | PRN
  Administered 2024-01-18: 0.5 mg via INTRAVENOUS
  Administered 2024-01-20: 1 mg via INTRAVENOUS
  Filled 2024-01-18 (×2): qty 1

## 2024-01-18 MED ORDER — ONDANSETRON HCL 4 MG PO TABS: 4.0000 mg | ORAL_TABLET | Freq: Four times a day (QID) | ORAL | Status: DC | PRN

## 2024-01-18 MED ORDER — SODIUM CHLORIDE 0.9 % IV SOLN: 250.0000 mL | INTRAVENOUS | Status: AC | PRN

## 2024-01-18 MED ORDER — PRAVASTATIN SODIUM 40 MG PO TABS
40.0000 mg | ORAL_TABLET | Freq: Every day | ORAL | Status: DC
  Administered 2024-01-18 – 2024-01-21 (×4): 40 mg via ORAL
  Filled 2024-01-18 (×4): qty 1

## 2024-01-18 MED ORDER — HYDROMORPHONE HCL 1 MG/ML IJ SOLN: 1.0000 mg | INTRAMUSCULAR | Status: DC | PRN

## 2024-01-18 MED ORDER — FERROUS SULFATE 325 (65 FE) MG PO TABS
325.0000 mg | ORAL_TABLET | Freq: Every day | ORAL | Status: DC
  Administered 2024-01-18 – 2024-01-21 (×4): 325 mg via ORAL
  Filled 2024-01-18 (×4): qty 1

## 2024-01-18 MED ORDER — CALCIUM GLUCONATE-NACL 1-0.675 GM/50ML-% IV SOLN
1.0000 g | Freq: Once | INTRAVENOUS | Status: AC
  Administered 2024-01-18: 1000 mg via INTRAVENOUS
  Filled 2024-01-18: qty 50

## 2024-01-18 MED ORDER — ACETAMINOPHEN 325 MG PO TABS
650.0000 mg | ORAL_TABLET | Freq: Four times a day (QID) | ORAL | Status: DC | PRN
  Administered 2024-01-19: 650 mg via ORAL
  Filled 2024-01-18: qty 2

## 2024-01-18 MED ORDER — LEVOTHYROXINE SODIUM 75 MCG PO TABS
75.0000 ug | ORAL_TABLET | Freq: Every day | ORAL | Status: DC
Start: 2024-01-19 — End: 2024-01-22
  Administered 2024-01-19 – 2024-01-22 (×4): 75 ug via ORAL
  Filled 2024-01-18 (×4): qty 1

## 2024-01-18 MED ORDER — SODIUM ZIRCONIUM CYCLOSILICATE 5 G PO PACK
10.0000 g | PACK | Freq: Once | ORAL | Status: AC
  Administered 2024-01-18: 10 g via ORAL
  Filled 2024-01-18: qty 2

## 2024-01-18 MED ORDER — SODIUM CHLORIDE 0.9% FLUSH
3.0000 mL | Freq: Two times a day (BID) | INTRAVENOUS | Status: DC
  Administered 2024-01-19 – 2024-01-22 (×5): 3 mL via INTRAVENOUS

## 2024-01-18 MED ORDER — HYDRALAZINE HCL 20 MG/ML IJ SOLN
10.0000 mg | Freq: Four times a day (QID) | INTRAMUSCULAR | Status: DC | PRN
  Administered 2024-01-19: 10 mg via INTRAVENOUS
  Filled 2024-01-18: qty 1

## 2024-01-18 MED ORDER — CLOPIDOGREL BISULFATE 75 MG PO TABS
75.0000 mg | ORAL_TABLET | Freq: Every morning | ORAL | Status: DC
  Administered 2024-01-19 – 2024-01-22 (×4): 75 mg via ORAL
  Filled 2024-01-18 (×4): qty 1

## 2024-01-18 MED ORDER — SODIUM CHLORIDE 0.9% FLUSH: 3.0000 mL | INTRAVENOUS | Status: DC | PRN

## 2024-01-18 MED ORDER — SODIUM CHLORIDE 0.9% FLUSH
3.0000 mL | Freq: Two times a day (BID) | INTRAVENOUS | Status: DC
  Administered 2024-01-19 – 2024-01-20 (×2): 3 mL via INTRAVENOUS

## 2024-01-18 MED ORDER — LACTATED RINGERS IV BOLUS
500.0000 mL | Freq: Once | INTRAVENOUS | Status: AC
  Administered 2024-01-18: 500 mL via INTRAVENOUS

## 2024-01-18 NOTE — ED Triage Notes (Signed)
 Pt arrives with c/o lower left leg pain that this morning. Per pt, she has had stents put in that left leg due to arterial occlusions. Pt reports swelling also. Pt denies SOB or CP. Pt reports it is painful to bear weight.

## 2024-01-18 NOTE — ED Provider Notes (Signed)
 [place] EMERGENCY DEPARTMENT AT [facility] Provider Note   CSN: [id number] Arrival date & time: 01/18/24  1613     Patient presents with: Leg Pain   [name] is a 83 y.o. female.  She has a history of PAD, GERD, hyperlipidemia, hypertension, persistent A-fib.  She is on Eliquis  and Plavix .  She states she took her Eliquis  this morning.  Presents to the ER today complaining of left leg pain that started this morning upon waking up.  Pain is primarily of the left lateral leg, she denies injury or trauma.  Pain is rated 8 out of 10 when trying to bear weight or when palpating but is slightly less severe when at rest.  She denies fever or chills.  She notes that her leg does have some swelling and redness.  She has had a prior stent in her left superficial femoral artery and had an occlusion in the stent which she had balloon to angioplasty on in May of this year.    Leg Pain      Prior to Admission medications   Medication Sig Start Date End Date Taking? Authorizing Provider  acetaminophen  (TYLENOL ) 325 MG tablet Take 2 tablets (650 mg total) by mouth every 6 (six) hours as needed for mild pain (pain score 1-3) (or Fever >/= 101). 03/20/23   Lue Elsie BROCKS, MD  AMBULATORY NON FORMULARY MEDICATION Dispense one Readi Steadi glove 09/25/22   Tat, Asberry RAMAN, DO  amiodarone  (PACERONE ) 200 MG tablet Take 200 mg by mouth daily.    [provider]  apixaban  (ELIQUIS ) 2.5 MG TABS tablet Take 1 tablet (2.5 mg total) by mouth 2 (two) times daily. 08/14/23   Delford Maude BROCKS, MD  ascorbic acid  (VITAMIN C ) 500 MG tablet Take 500 mg by mouth in the morning.    [provider]  CALCIUM PO Take 1,200 mg by mouth 2 (two) times daily. 600 mg each    [provider]  Camphor-Menthol-Methyl Sal (SALONPAS) 3.04-14-08 % PTCH Place 1 patch onto the skin daily as needed (pain.).    [provider]  clopidogrel  (PLAVIX ) 75 MG tablet Take 1 tablet (75 mg  total) by mouth every morning. 10/31/23   Delford Maude BROCKS, MD  denosumab  (PROLIA ) 60 MG/ML SOSY injection Inject 60 mg into the skin every 6 (six) months.    [name], MD  doxycycline  (VIBRA -TABS) 100 MG tablet Take 100 mg by mouth 2 (two) times daily. Patient not taking: Reported on 01/14/2024 12/12/23   [provider]  Ferrous Sulfate  (IRON PO) Take 65 mg by mouth at bedtime.    [provider]  gabapentin  (NEURONTIN ) 100 MG capsule Take 1 capsule (100 mg total) by mouth 3 (three) times daily. 12/31/23   Standiford, Alexander F, DPM  levothyroxine (SYNTHROID) 75 MCG tablet Take 75 mcg by mouth daily before breakfast.    [provider]  levothyroxine (SYNTHROID) 75 MCG tablet Take 75 mcg by mouth daily before breakfast.    [provider]  MAGNESIUM  OXIDE PO Take 500 mg by mouth every morning.    [provider]  metoprolol  tartrate (LOPRESSOR ) 25 MG tablet Take 3 tablets (75 mg total) by mouth 2 (two) times daily. 08/29/23   Nishan, Peter C, MD  Multiple Vitamin (MULTIVITAMIN WITH MINERALS) TABS tablet Take 1 tablet by mouth every morning.    [provider]  mupirocin  ointment (BACTROBAN ) 2 % Apply 1 Application topically daily. 04/17/23   Standiford,  Alexander F, DPM  mupirocin  ointment (BACTROBAN ) 2 % Apply 1 Application topically daily. 11/26/23   Standiford, Marsa FALCON, DPM  pantoprazole  (PROTONIX ) 40 MG tablet TAKE 1 TABLET DAILY 08/01/23   Nishan, Peter C, MD  potassium chloride  (KLOR-CON  M) 10 MEQ tablet Take 2 tablets (20 mEq total) by mouth daily. 12/12/23   Delford Maude BROCKS, MD  potassium chloride  (KLOR-CON  M10) 10 MEQ tablet Take 10 mEq by mouth 2 (two) times daily.    [provider]  potassium chloride  (MICRO-K ) 10 MEQ CR capsule Take 10 mEq by mouth 2 (two) times daily.    [provider]  pravastatin  (PRAVACHOL ) 40 MG tablet Take 1 tablet (40 mg total) by mouth daily. 08/20/23   Delford Maude BROCKS, MD  torsemide   (DEMADEX ) 20 MG tablet Take 1 tablet (20 mg total) by mouth 2 (two) times daily. 12/20/23   Nishan, Peter C, MD  umeclidinium bromide  (INCRUSE ELLIPTA ) 62.5 MCG/ACT AEPB Inhale 1 puff into the lungs daily. 08/08/23   Geronimo Amel, MD    Allergies: Iodine , Iohexol , Petroleum gauze non-woven 3x9 [wound dressings], Sulfa  antibiotics, Wound dressing adhesive, and Tape    Review of Systems  Updated Vital Signs BP (!) 179/73   Pulse (!) 52   Temp 98.4 F (36.9 C)   Resp 19   Ht 5' 1 (1.549 m)   Wt 38.6 kg   SpO2 95%   BMI 16.06 kg/m   Physical Exam Vitals and nursing note reviewed.  Constitutional:      General: She is not in acute distress.    Appearance: She is well-developed.  HENT:     Head: Normocephalic and atraumatic.  Eyes:     Conjunctiva/sclera: Conjunctivae normal.  Cardiovascular:     Rate and Rhythm: Normal rate and regular rhythm.     Heart sounds: No murmur heard. Pulmonary:     Effort: Pulmonary effort is normal. No respiratory distress.     Breath sounds: Normal breath sounds.  Abdominal:     Palpations: Abdomen is soft.     Tenderness: There is no abdominal tenderness.  Musculoskeletal:        General: No swelling.     Cervical back: Neck supple.     Comments: Patient has soft tissue swelling and tenderness to the left lateral leg.  She has sensation intact to the left leg.  DP and PT pulse of the left foot are not palpable and are not able to be obtained by Doppler.  Left leg is cool from mid shin down compared to the opposite side, capillary fill is approximately 1 second.  Patient has normal sensation and movement in the left leg and foot.  She has tenderness to the lateral aspect of the left foot as well.  Skin:    General: Skin is warm and dry.     Capillary Refill: Capillary refill takes less than 2 seconds.  Neurological:     General: No focal deficit present.     Mental Status: She is alert and oriented to person, place, and time.  Psychiatric:         Mood and Affect: Mood normal.     (all labs ordered are listed, but only abnormal results are displayed) Labs Reviewed  CBC WITH DIFFERENTIAL/PLATELET - Abnormal; Notable for the following components:      Result Value   WBC 13.5 (*)    RBC 3.69 (*)    RDW 16.2 (*)    Neutro Abs 10.8 (*)  Monocytes Absolute 1.7 (*)    All other components within normal limits  BASIC METABOLIC PANEL WITH GFR - Abnormal; Notable for the following components:   Potassium 5.4 (*)    Chloride 96 (*)    BUN 51 (*)    Creatinine, Ser 1.94 (*)    Calcium 11.4 (*)    GFR, Estimated 25 (*)    All other components within normal limits    EKG: None  Radiology: No results found.   Procedures   Medications Ordered in the ED  fentaNYL  (SUBLIMAZE ) injection 25 mcg (25 mcg Intravenous Given 01/18/24 1749)                                    Medical Decision Making Differential diagnose includes but not limited to cellulitis, contusion, fracture, critical limb ischemia, other  ED course: Patient has a history of PAD.  Presenting today with left leg pain.  Most of the pain is in left lateral leg that she does have pain in her foot as well with some redness of the skin, not able to Doppler the pulses.  She has normal sensation and movement in the left foot,  however.  She did not have any injury or trauma.  Given her significant history of PAD with multiple procedures in the past and the fact that I am no longer able to obtain with Doppler which were documented to be obtainable in May of this year with a cool foot, I consulted vascular surgery.  Certainly with story of lateral leg pain that is reproducible is less concerning for critical limb ischemia but her exam is concerning, and she has history of severe PAD.  Given this I spoke with Dr. Serene who would like patient to be transferred ED to ED to [facility] for further evaluation by vascular and they can determine if she needs any further testing  there.  She took her Eliquis  this morning, so he did not recommend empiric heparin  at this time. Patient also seen by Dr. Cleotilde in the ED   Basic labs were ordered, she does have a mild AKI and potassium slightly low at 5.4, she also had mild leukocytosis with white blood cell count 13.5.  Patient also requested x-ray of tib-fib which was ordered as she states she has osteoporosis.  She had no trauma but is worried she could have a hairline fracture.  X-rays were reviewed and interpreted by myself, radiology read is pending but I do not see an obvious fracture of tibia or fibula.   On exam fentanyl  for pain with moderate relief.  She also had worsening CKD with potassium of 5.4, given IV fluids and Lokelma .  Patient verbalized understanding of these treatments.  Dr. Rolan Quale is accepting ED to ED.  They will see patient once notified after she arrives.  Amount and/or Complexity of Data Reviewed External Data Reviewed: labs and notes. Labs: ordered. Radiology: ordered.  Risk Prescription drug management.        Final diagnoses:  Right leg pain    ED Discharge Orders     None          [name] 01/18/24 1840    Cleotilde Rogue, MD 01/19/24 [name]

## 2024-01-18 NOTE — ED Notes (Signed)
 ED Provider at bedside.

## 2024-01-18 NOTE — ED Notes (Signed)
 Patient is coming by [facility] from [facility]. Patient is here for vascular surgeon consultation. EDP Emil, MD has assessed patient.

## 2024-01-18 NOTE — Consult Note (Signed)
 [facility] [place]  Patient name: [name] MRN: [id number] DOB: 12-Jun-1940 Sex: female   REQUESTING PROVIDER:   ER   REASON FOR CONSULT:    Left leg pain  HISTORY OF PRESENT ILLNESS:   [name] is a 83 y.o. female, who presented to the [facility] emergency department earlier today with acute onset of left leg pain that began this morning.  She is unable to put weight on her leg.  She denies any trauma.  This is a focal area of pain on the lateral side of her left leg.  She does not complain of pain or numbness in the left foot.  The patient is well-known to me having undergone the following procedures: 12/19/2021: Left superficial femoral artery stent, DCB, left popliteal artery, angioplasty, left peroneal artery (critical limb ischemia) 12/29/2021: Left great toe amputation, Dr. Tobie 08/28/2022: Laser atherectomy with angioplasty, left popliteal and left anterior tibial artery (left leg ulcer) 10/16/2022: Laser atherectomy with angioplasty, right anterior tibial artery (ulcer) 02/26/2023: Laser atherectomy and DCB to the right SFA.  Angioplasty, right anterior tibial and right peroneal artery 03/15/2023: I&D right ankle ulcer with allograft (Dr. Malvin) 07/16/2023: Laser atherectomy and DCB to left SFA   She has hx of breast cancer, status post left mastectomy.  She is on Eliquis  for atrial fibrillation.   She has carotid artery stenosis that is followed by Dr. Delford.  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  She takes a statin for hypercholesterolemia.  She is on Eliquis .  She is a former smoker  PAST MEDICAL HISTORY    Past Medical History:  Diagnosis Date   Anemia 05/2014   Atrial fibrillation (HCC)    x 3 yrs   Breast cancer (HCC)    S/P left mastectomy and chemotherapy 1989 remained in remission   Carotid artery occlusion    Carotid bruit    LICA 60-79% (duplex  9/11)   Degenerative arthritis of spine 2015   Chronic back pain   Hyperlipidemia    Hypertension    Meningioma (HCC)    Osteoporosis    Peripheral arterial disease    Pneumonia 05/19/2014   Stroke [facility])    CVA 2008   Subclavian steal syndrome    Ulcers of both lower extremities (HCC) 2015     FAMILY HISTORY   Family History  Problem Relation Age of Onset   Alzheimer's disease Mother    Dementia Mother    Hypertension Mother    Hyperlipidemia Mother    Parkinsonism Father     SOCIAL HISTORY:   Social History   Socioeconomic History   Marital status: Married    Spouse name: Not on file   Number of children: 2   Years of education: Not on file   Highest education level: Not on file  Occupational History   Occupation: Retired    Associate Professor: RETIRED  Tobacco Use   Smoking status: Former    Current packs/day: 0.00    Average packs/day: 1 pack/day for 56.0 years (56.0 ttl pk-yrs)    Types: Cigarettes    Start date: 08/14/1950    Quit date: 08/14/2006    Years since quitting: 17.4    Passive exposure: Never   Smokeless tobacco: Never   Tobacco comments:    quit in 2008  Vaping Use   Vaping status: Never Used  Substance and Sexual Activity   Alcohol use: No    Alcohol/week:  0.0 standard drinks of alcohol   Drug use: No   Sexual activity: Never  Other Topics Concern   Not on file  Social History Narrative   Right handed   Social Drivers of Health   Financial Resource Strain: Not on file  Food Insecurity: No Food Insecurity (07/25/2023)   Hunger Vital Sign    Worried About Running Out of Food in the Last Year: Never true    Ran Out of Food in the Last Year: Never true  Transportation Needs: No Transportation Needs (07/25/2023)   PRAPARE - Administrator, Civil Service (Medical): No    Lack of Transportation (Non-Medical): No  Physical Activity: Not on file  Stress: Not on file  Social Connections: Unknown (07/25/2023)   Social Connection and  Isolation Panel    Frequency of Communication with Friends and Family: Patient declined    Frequency of Social Gatherings with Friends and Family: Patient declined    Attends Religious Services: Not on Marketing executive or Organizations: Not on file    Attends Banker Meetings: Patient declined    Marital Status: Married  Catering manager Violence: Not At Risk (07/25/2023)   Humiliation, Afraid, Rape, and Kick questionnaire    Fear of Current or Ex-Partner: No    Emotionally Abused: No    Physically Abused: No    Sexually Abused: No    ALLERGIES:    Allergies  Allergen Reactions   Iodine  Rash and Other (See Comments)    BETADINE, blistering rash/burns   Iohexol  Rash and Other (See Comments)    Blisters; PT NEEDS 13-HOUR PREP    Petroleum Gauze Non-Woven 3x9 [Wound Dressings] Other (See Comments)    BURNING SENSATION, RED SKIN   Sulfa  Antibiotics Nausea And Vomiting   Wound Dressing Adhesive Other (See Comments)    Burns skin   Tape Itching, Rash and Other (See Comments)    DO NOT USE ADHESIVE TAPE Not even a Band Aid NO PAPER TAPE Burns/tears skin Skin is very very thin    CURRENT MEDICATIONS:    No current facility-administered medications for this encounter.   Current Outpatient Medications  Medication Sig Dispense Refill   acetaminophen  (TYLENOL ) 325 MG tablet Take 2 tablets (650 mg total) by mouth every 6 (six) hours as needed for mild pain (pain score 1-3) (or Fever >/= 101). 30 tablet 0   amiodarone  (PACERONE ) 200 MG tablet Take 200 mg by mouth every morning.     apixaban  (ELIQUIS ) 2.5 MG TABS tablet Take 1 tablet (2.5 mg total) by mouth 2 (two) times daily. 180 tablet 1   ascorbic acid  (VITAMIN C ) 500 MG tablet Take 500 mg by mouth in the morning.     CALCIUM PO Take 1,200 mg by mouth 2 (two) times daily. 600 mg each     Camphor-Menthol-Methyl Sal (SALONPAS) 3.04-14-08 % PTCH Place 1 patch onto the skin daily as needed (pain.).      clopidogrel  (PLAVIX ) 75 MG tablet Take 1 tablet (75 mg total) by mouth every morning. 90 tablet 3   denosumab  (PROLIA ) 60 MG/ML SOSY injection Inject 60 mg into the skin every 6 (six) months.     Ferrous Sulfate  (IRON PO) Take 65 mg by mouth at bedtime.     HYDROcodone -acetaminophen  (NORCO/VICODIN) 5-325 MG tablet Take 1 tablet by mouth every 8 (eight) hours as needed for moderate pain (pain score 4-6) or severe pain (pain score 7-10).  levothyroxine (SYNTHROID) 75 MCG tablet Take 75 mcg by mouth daily before breakfast.     MAGNESIUM  OXIDE PO Take 500 mg by mouth every morning.     metoprolol  tartrate (LOPRESSOR ) 25 MG tablet Take 3 tablets (75 mg total) by mouth 2 (two) times daily. 540 tablet 1   Multiple Vitamin (MULTIVITAMIN WITH MINERALS) TABS tablet Take 1 tablet by mouth every morning.     mupirocin  ointment (BACTROBAN ) 2 % Apply 1 Application topically daily. (Patient taking differently: Apply 1 Application topically daily. Applied to wound on lower right leg) 22 g 0   pantoprazole  (PROTONIX ) 40 MG tablet TAKE 1 TABLET DAILY 90 tablet 3   potassium chloride  (KLOR-CON  M) 10 MEQ tablet Take 2 tablets (20 mEq total) by mouth daily. (Patient taking differently: Take 10 mEq by mouth 2 (two) times daily.) 180 tablet 3   pravastatin  (PRAVACHOL ) 40 MG tablet Take 1 tablet (40 mg total) by mouth daily. 90 tablet 1   torsemide  (DEMADEX ) 20 MG tablet Take 1 tablet (20 mg total) by mouth 2 (two) times daily. (Patient taking differently: Take 20 mg by mouth daily.) 180 tablet 1   umeclidinium bromide  (INCRUSE ELLIPTA ) 62.5 MCG/ACT AEPB Inhale 1 puff into the lungs daily. 90 each 3   AMBULATORY NON FORMULARY MEDICATION Dispense one Readi Steadi glove (Patient not taking: Reported on 01/18/2024) 1 Units 0   gabapentin  (NEURONTIN ) 100 MG capsule Take 1 capsule (100 mg total) by mouth 3 (three) times daily. (Patient not taking: Reported on 01/18/2024) 90 capsule 3   predniSONE (DELTASONE) 5 MG tablet Take  5 mg by mouth daily at 6 (six) AM. X5 days for acute back pain (Patient not taking: Reported on 01/18/2024)      REVIEW OF SYSTEMS:   [X]  denotes positive finding, [ ]  denotes negative finding Cardiac  Comments:  Chest pain or chest pressure:    Shortness of breath upon exertion:    Short of breath when lying flat:    Irregular heart rhythm:        Vascular    Pain in calf, thigh, or hip brought on by ambulation:    Pain in feet at night that wakes you up from your sleep:     Blood clot in your veins:    Leg swelling:         Pulmonary    Oxygen at home:    Productive cough:     Wheezing:         Neurologic    Sudden weakness in arms or legs:     Sudden numbness in arms or legs:     Sudden onset of difficulty speaking or slurred speech:    Temporary loss of vision in one eye:     Problems with dizziness:         Gastrointestinal    Blood in stool:      Vomited blood:         Genitourinary    Burning when urinating:     Blood in urine:        Psychiatric    Major depression:         Hematologic    Bleeding problems:    Problems with blood clotting too easily:        Skin    Rashes or ulcers:        Constitutional    Fever or chills:     PHYSICAL EXAM:   Vitals:   01/18/24 1900 01/18/24 1915  01/18/24 1945 01/18/24 2123  BP: (!) 200/76 (!) 204/80 (!) 197/71 (!) 219/70  Pulse: (!) 57 (!) 52 (!) 50 60  Resp: 16   (!) 23  Temp:      TempSrc:      SpO2: 94% 95% 92% 98%  Weight:      Height:        GENERAL: The patient is a well-nourished female, in no acute distress. The vital signs are documented above. CARDIAC: There is a regular rate and rhythm.  VASCULAR: Biphasic left popliteal signal.  Biphasic left and right peroneal Doppler signals PULMONARY: Nonlabored respirations ABDOMEN: Soft and non-tender with normal pitched bowel sounds.  MUSCULOSKELETAL: There are no major deformities or cyanosis. NEUROLOGIC: Loss of sensation in bilateral feet.  She  does have sensation bilaterally beginning at the ankle.  At baseline motor function of the left and right foot. SKIN: Chronic bilateral lower extremity ulcer PSYCHIATRIC: The patient has a normal affect.  STUDIES:   Left tib-fib x-ray: No acute abnormality noted.   ASSESSMENT and PLAN   Left leg pain: The patient has an atypical presentation for acute ischemia to the left leg.  By my exam she has a biphasic left popliteal and peroneal signal.  She also has essentially normal neurologic exam of the left foot.  Therefore, I do not think that she has an acutely ischemic process.  In addition, her pain is localized to a roughly 3 cm area along the left fibula.  She denies any trauma.  Currently, she cannot bear weight on her left leg.  I have recommended admission to the hospital for further evaluation.  I would also check bilateral lower extremity arterial Doppler studies tomorrow just to rule out in-stent stenosis.  Her last dose of Eliquis  was this morning.  She can be placed on heparin .   Colleen Serene CLORE, MD, FACS [facility] [phone] Pager [phone]

## 2024-01-18 NOTE — Progress Notes (Signed)
 PHARMACY - ANTICOAGULATION CONSULT NOTE  Pharmacy Consult for heparin  Indication: Ischemic limb  Allergies  Allergen Reactions   Iodine  Rash and Other (See Comments)    BETADINE, blistering rash/burns   Iohexol  Rash and Other (See Comments)    Blisters; PT NEEDS 13-HOUR PREP    Petroleum Gauze Non-Woven 3x9 [Wound Dressings] Other (See Comments)    BURNING SENSATION, RED SKIN   Sulfa  Antibiotics Nausea And Vomiting   Wound Dressing Adhesive Other (See Comments)    Burns skin   Tape Itching, Rash and Other (See Comments)    DO NOT USE ADHESIVE TAPE Not even a Band Aid NO PAPER TAPE Burns/tears skin Skin is very very thin    Patient Measurements: Height: 5' 1 (154.9 cm) Weight: 38.6 kg (85 lb) IBW/kg (Calculated) : 47.8 HEPARIN  DW (KG): 38.6  Vital Signs: Temp: 98.4 F (36.9 C) (10/11 1842) Temp Source: Oral (10/11 1842) BP: 219/70 (10/11 2123) Pulse Rate: 60 (10/11 2123)  Labs: Recent Labs    01/18/24 1749  HGB 12.1  HCT 36.7  PLT 357  CREATININE 1.94*    Estimated Creatinine Clearance: 12.9 mL/min (A) (by C-G formula based on SCr of 1.94 mg/dL (H)).   Medical History: Past Medical History:  Diagnosis Date   Anemia 05/2014   Atrial fibrillation (HCC)    x 3 yrs   Breast cancer (HCC)    S/P left mastectomy and chemotherapy 1989 remained in remission   Carotid artery occlusion    Carotid bruit    LICA 60-79% (duplex 9/11)   Degenerative arthritis of spine 2015   Chronic back pain   Hyperlipidemia    Hypertension    Meningioma (HCC)    Osteoporosis    Peripheral arterial disease    Pneumonia 05/19/2014   Stroke [facility])    CVA 2008   Subclavian steal syndrome    Ulcers of both lower extremities (HCC) 2015     Assessment: 64 YOF presenting with ischemia of left leg, on Eliquis  PTA for afib with last dose 10/11 AM  Goal of Therapy:  Heparin  level 0.3-0.7 units/ml aPTT 66-102 seconds Monitor platelets by anticoagulation protocol: Yes    Plan:  Heparin  gtt at 600 units/hr, no bolus F/u 8 hour aPTT/HL   Dorn Poot, PharmD, [facility] Clinical Pharmacist ED Pharmacist Phone # [phone] 01/18/2024 9:59 PM

## 2024-01-18 NOTE — H&P (Signed)
 History and Physical    [name] FMW:[id number] DOB: Jun 07, 1940 DOA: 01/18/2024  PCP: [name], MD   Patient coming from: Home   Chief Complaint:  Chief Complaint  Patient presents with   Leg Pain   ED TRIAGE note:  HPI:  [name] is a 83 y.o. female with medical history significant of extensive peripheral vascular disease with multiple angioplasty and stent placement, breast cancer status post mastectomy, atrial fibrillation on Eliquis , CAD, CKD 3B, anemia of chronic disease, essential hypertension, hyperlipidemia, meningioma, history of necrotizing pneumonia, COPD, gastric ulcer and prior CVA presented to emergency department complaining of left-sided leg pain that started this morning and associated swelling.  Patient reported unable to bear weight of the left leg due to pain.  Initially presented to [facility] emergency department and later on transferred to [facility] for being evaluated by vascular surgery.    In the ED patient has been evaluated with vascular surgery and per vascular surgery note in chart review patient has following procedures: 12/19/2021: Left superficial femoral artery stent, DCB, left popliteal artery, angioplasty, left peroneal artery (critical limb ischemia) 12/29/2021: Left great toe amputation, Dr. Tobie 08/28/2022: Laser atherectomy with angioplasty, left popliteal and left anterior tibial artery (left leg ulcer) 10/16/2022: Laser atherectomy with angioplasty, right anterior tibial artery (ulcer) 02/26/2023: Laser atherectomy and DCB to the right SFA.  Angioplasty, right anterior tibial and right peroneal artery 03/15/2023: I&D right ankle ulcer with allograft (Dr. Malvin) 07/16/2023: Laser atherectomy and DCB to left SFA.  Dr. Serene has been evaluated patient in the ED for left-sided acute ischemia.  Per vascular surgery evaluation patient has left popliteal and left peroneal signal also has good neurological exam on left foot and  at this time there is no need for intervention.  Recommended to obtain bilateral lower extremity arterial Doppler study to rule out in-stent thrombosis.  Patient has last dose of Eliquis  today morning 10/11 and currently on heparin  drip.   At presentation to ED patient found borderline hypertensive blood pressure was up to 219/70 which improved to 173/72.  Bradycardic heart rate in between 48-60.  Otherwise hemodynamically stable.    Lab work, CBC showing leukocytosis 13.5 otherwise unremarkable.  BMP shows elevated potassium 5.4 elevated creatinine 1.94 and elevated calcium 11.4. X-ray of the tibia-fibula left showed no acute abnormality.  In the ED patient has been initiated on heparin  drip and 500 mL of LR bolus.  And fentanyl  25 mcg.  Hospitalist has been consulted for evaluation for concern for left lower extremity stent blockage, AKI, hypertensive urgency and hyperkalemia.  Significant labs in the ED: Lab Orders         CBC with Differential         Basic metabolic panel         Heparin  level (unfractionated)         APTT         CBC         Comprehensive metabolic panel         Sodium, urine, random         Creatinine, urine, random       Review of Systems:  ROS  Past Medical History:  Diagnosis Date   Anemia 05/2014   Atrial fibrillation (HCC)    x 3 yrs   Breast cancer (HCC)    S/P left mastectomy and chemotherapy 1989 remained in remission   Carotid artery occlusion    Carotid bruit  LICA 60-79% (duplex 9/11)   Degenerative arthritis of spine 2015   Chronic back pain   Hyperlipidemia    Hypertension    Meningioma (HCC)    Osteoporosis    Peripheral arterial disease    Pneumonia 05/19/2014   Stroke [facility])    CVA 2008   Subclavian steal syndrome    Ulcers of both lower extremities (HCC) 2015    Past Surgical History:  Procedure Laterality Date   ABDOMINAL AORTOGRAM W/LOWER EXTREMITY N/A 12/19/2021   Procedure: ABDOMINAL AORTOGRAM W/LOWER EXTREMITY;   Surgeon: Serene Gaile ORN, MD;  [facility] INVAS[facility] CV LAB;  Service: Cardiovascular;  Laterality: N/A;   ABDOMINAL AORTOGRAM W/LOWER EXTREMITY Bilateral 08/28/2022   Procedure: ABDOMINAL AORTOGRAM W/LOWER EXTREMITY;  Surgeon: Serene Gaile ORN, MD;  Location: [facility];  Service: Cardiovascular;  Laterality: Bilateral;   ABDOMINAL AORTOGRAM W/LOWER EXTREMITY N/A 10/16/2022   Procedure: ABDOMINAL AORTOGRAM W/LOWER EXTREMITY;  Surgeon: Serene Gaile ORN, MD;  [facility]: [facility];  Service: Cardiovascular;  Laterality: N/A;   ABDOMINAL AORTOGRAM W/LOWER EXTREMITY N/A 02/26/2023   Procedure: ABDOMINAL AORTOGRAM W/LOWER EXTREMITY;  Surgeon: Serene Gaile ORN, MD;  [facility]: [facility];  Service: Cardiovascular;  Laterality: N/A;   ABDOMINAL AORTOGRAM W/LOWER EXTREMITY N/A 07/16/2023   Procedure: ABDOMINAL AORTOGRAM W/LOWER EXTREMITY;  Surgeon: Serene Gaile ORN, MD;  [facility];  Service: Cardiovascular;  Laterality: N/A;   AMPUTATION TOE Left 12/29/2021   Procedure: Incisional DEBRIDEMENT Left Foot with Amputation Left Great Toe;  Surgeon: Tobie Franky SQUIBB, DPM;  Location: [facility] OR;  Service: Podiatry;  Laterality: Left;   CARDIAC CATHETERIZATION     CATARACT EXTRACTION Bilateral 2013   COLONOSCOPY  11/17/2010   Procedure: COLONOSCOPY;  Surgeon: Claudis RAYMOND Rivet, MD;  Location: [facility] ENDO [facility];  Service: Endoscopy;  Laterality: N/A;  10:45 am   ESOPHAGOGASTRODUODENOSCOPY  11/17/2010   Procedure: ESOPHAGOGASTRODUODENOSCOPY (EGD);  Surgeon: Claudis RAYMOND Rivet, MD;  Location: [facility];  Service: Endoscopy;  Laterality: N/A;   ESOPHAGOGASTRODUODENOSCOPY N/A 02/16/2015   Procedure: ESOPHAGOGASTRODUODENOSCOPY (EGD);  Surgeon: Elspeth Deward Naval, MD;  Location: [facility] ENDOSCOPY;  Service: Gastroenterology;  Laterality: N/A;   EXPLORATORY LAPAROTOMY  1960s   For peritonitis of undetermined cause   EYE SURGERY     INCISION AND DRAINAGE OF WOUND Right 03/15/2023   Procedure:  IRRIGATION AND DEBRIDEMENT WOUND;  Surgeon: Malvin Marsa FALCON, DPM;  Location: [facility] OR;  Service: Orthopedics/Podiatry;  Laterality: Right;  Right ankle ulcer debridement, bone biopsy, graft application   LOWER EXTREMITY ANGIOGRAPHY N/A 07/16/2023   Procedure: Lower Extremity Angiography;  Surgeon: Serene Gaile ORN, MD;  [facility];  Service: Cardiovascular;  Laterality: N/A;   LOWER EXTREMITY INTERVENTION Left 07/16/2023   Procedure: LOWER EXTREMITY INTERVENTION;  Surgeon: Serene Gaile ORN, MD;  [facility];  Service: Cardiovascular;  Laterality: Left;  SFA laser   LUMBAR EPIDURAL INJECTION  06-2012--06-2013   pt. states she has had 5 epidurals in 06-2012----06-2013   MASTECTOMY Left 1990   PERIPHERAL VASCULAR ATHERECTOMY  08/28/2022   Procedure: PERIPHERAL VASCULAR ATHERECTOMY;  Surgeon: Serene Gaile ORN, MD;  [facility];  Service: Cardiovascular;;  Lt Popliteal and AT   PERIPHERAL VASCULAR ATHERECTOMY  02/26/2023   Procedure: PERIPHERAL VASCULAR ATHERECTOMY;  Surgeon: Serene Gaile ORN, MD;  [facility];  Service: Cardiovascular;;   PERIPHERAL VASCULAR BALLOON ANGIOPLASTY  08/28/2022   Procedure: PERIPHERAL VASCULAR BALLOON ANGIOPLASTY;  Surgeon: Serene Gaile  W, MD;  [facility];  Service: Cardiovascular;;  Lt Popliteal and AT   PERIPHERAL VASCULAR BALLOON ANGIOPLASTY  10/16/2022   Procedure: PERIPHERAL VASCULAR BALLOON ANGIOPLASTY;  Surgeon: Serene Gaile ORN, MD;  [facility];  Service: Cardiovascular;;  Rt AT   PERIPHERAL VASCULAR BALLOON ANGIOPLASTY  02/26/2023   Procedure: PERIPHERAL VASCULAR BALLOON ANGIOPLASTY;  Surgeon: Serene Gaile ORN, MD;  [facility];  Service: Cardiovascular;;   PERIPHERAL VASCULAR INTERVENTION Left 12/19/2021   Procedure: PERIPHERAL VASCULAR INTERVENTION;  Surgeon: Serene Gaile ORN, MD;  [facility];  Service: Cardiovascular;   Laterality: Left;  SFA and PTA of POP   YAG LASER APPLICATION Right 05/03/2015   Procedure: YAG LASER APPLICATION;  Surgeon: Oneil Platts, MD;  Location: [facility] ORS;  Service: Ophthalmology;  Laterality: Right;  right   YAG LASER APPLICATION Left 05/24/2015   Procedure: YAG LASER APPLICATION;  Surgeon: Oneil Platts, MD;  Location: [facility] ORS;  Service: Ophthalmology;  Laterality: Left;     reports that she quit smoking about 17 years ago. Her smoking use included cigarettes. She started smoking about 73 years ago. She has a 56 pack-year smoking history. She has never been exposed to tobacco smoke. She has never used smokeless tobacco. She reports that she does not drink alcohol and does not use drugs.  Allergies  Allergen Reactions   Iodine  Rash and Other (See Comments)    BETADINE, blistering rash/burns   Iohexol  Rash and Other (See Comments)    Blisters; PT NEEDS 13-HOUR PREP    Petroleum Gauze Non-Woven 3x9 [Wound Dressings] Other (See Comments)    BURNING SENSATION, RED SKIN   Sulfa  Antibiotics Nausea And Vomiting   Wound Dressing Adhesive Other (See Comments)    Burns skin   Tape Itching, Rash and Other (See Comments)    DO NOT USE ADHESIVE TAPE Not even a Band Aid NO PAPER TAPE Burns/tears skin Skin is very very thin    Family History  Problem Relation Age of Onset   Alzheimer's disease Mother    Dementia Mother    Hypertension Mother    Hyperlipidemia Mother    Parkinsonism Father     Prior to Admission medications   Medication Sig Start Date End Date Taking? Authorizing Provider  acetaminophen  (TYLENOL ) 325 MG tablet Take 2 tablets (650 mg total) by mouth every 6 (six) hours as needed for mild pain (pain score 1-3) (or Fever >/= 101). 03/20/23  Yes Lue Elsie BROCKS, MD  amiodarone  (PACERONE ) 200 MG tablet Take 200 mg by mouth every morning.   Yes [provider]  apixaban  (ELIQUIS ) 2.5 MG TABS tablet Take 1 tablet (2.5 mg total) by mouth 2 (two) times daily.  08/14/23  Yes Delford Maude BROCKS, MD  ascorbic acid  (VITAMIN C ) 500 MG tablet Take 500 mg by mouth in the morning.   Yes [provider]  CALCIUM PO Take 1,200 mg by mouth 2 (two) times daily. 600 mg each   Yes [provider]  Camphor-Menthol-Methyl Sal (SALONPAS) 3.04-14-08 % PTCH Place 1 patch onto the skin daily as needed (pain.).   Yes [provider]  clopidogrel  (PLAVIX ) 75 MG tablet Take 1 tablet (75 mg total) by mouth every morning. 10/31/23  Yes Nishan, Peter C, MD  denosumab  (PROLIA ) 60 MG/ML SOSY injection Inject 60 mg into the skin every 6 (six) months.   Yes [name], MD  Ferrous Sulfate  (IRON PO) Take 65 mg by  mouth at bedtime.   Yes [provider]  HYDROcodone -acetaminophen  (NORCO/VICODIN) 5-325 MG tablet Take 1 tablet by mouth every 8 (eight) hours as needed for moderate pain (pain score 4-6) or severe pain (pain score 7-10). 01/13/24  Yes [provider]  levothyroxine (SYNTHROID) 75 MCG tablet Take 75 mcg by mouth daily before breakfast.   Yes [provider]  MAGNESIUM  OXIDE PO Take 500 mg by mouth every morning.   Yes [provider]  metoprolol  tartrate (LOPRESSOR ) 25 MG tablet Take 3 tablets (75 mg total) by mouth 2 (two) times daily. 08/29/23  Yes Nishan, Peter C, MD  Multiple Vitamin (MULTIVITAMIN WITH MINERALS) TABS tablet Take 1 tablet by mouth every morning.   Yes [provider]  mupirocin  ointment (BACTROBAN ) 2 % Apply 1 Application topically daily. Patient taking differently: Apply 1 Application topically daily. Applied to wound on lower right leg 11/26/23  Yes Standiford, Marsa FALCON, DPM  pantoprazole  (PROTONIX ) 40 MG tablet TAKE 1 TABLET DAILY 08/01/23  Yes Nishan, Peter C, MD  potassium chloride  (KLOR-CON  M) 10 MEQ tablet Take 2 tablets (20 mEq total) by mouth daily. Patient taking differently: Take 10 mEq by mouth 2 (two) times daily. 12/12/23  Yes Delford Maude BROCKS, MD  pravastatin  (PRAVACHOL ) 40 MG  tablet Take 1 tablet (40 mg total) by mouth daily. 08/20/23  Yes Delford Maude BROCKS, MD  torsemide  (DEMADEX ) 20 MG tablet Take 1 tablet (20 mg total) by mouth 2 (two) times daily. Patient taking differently: Take 20 mg by mouth daily. 12/20/23  Yes Delford Maude BROCKS, MD  umeclidinium bromide  (INCRUSE ELLIPTA ) 62.5 MCG/ACT AEPB Inhale 1 puff into the lungs daily. 08/08/23  Yes Geronimo Amel, MD  AMBULATORY NON FORMULARY MEDICATION Dispense one Readi Steadi glove Patient not taking: Reported on 01/18/2024 09/25/22   Tat, Asberry RAMAN, DO  gabapentin  (NEURONTIN ) 100 MG capsule Take 1 capsule (100 mg total) by mouth 3 (three) times daily. Patient not taking: Reported on 01/18/2024 12/31/23   Standiford, Marsa FALCON, DPM  predniSONE (DELTASONE) 5 MG tablet Take 5 mg by mouth daily at 6 (six) AM. X5 days for acute back pain Patient not taking: Reported on 01/18/2024 01/13/24   [provider]     Physical Exam: Vitals:   01/18/24 1945 01/18/24 2123 01/18/24 2145 01/18/24 2230  BP: (!) 197/71 (!) 219/70 (!) 173/72 (!) 156/61  Pulse: (!) 50 60 (!) 48 (!) 52  Resp:  (!) 23 19 13   Temp:      TempSrc:      SpO2: 92% 98% 95% 97%  Weight:      Height:        Physical Exam   Labs on Admission: I have personally reviewed following labs and imaging studies  CBC: Recent Labs  Lab 01/18/24 1749  WBC 13.5*  NEUTROABS 10.8*  HGB 12.1  HCT 36.7  MCV 99.5  PLT 357   Basic Metabolic Panel: Recent Labs  Lab 01/18/24 1749  NA 141  K 5.4*  CL 96*  CO2 32  GLUCOSE 95  BUN 51*  CREATININE 1.94*  CALCIUM 11.4*   GFR: Estimated Creatinine Clearance: 12.9 mL/min (A) (by C-G formula based on SCr of 1.94 mg/dL (H)). Liver Function Tests: No results for input(s): AST, ALT, ALKPHOS, BILITOT, PROT, ALBUMIN  in the last 168 hours. No results for input(s): LIPASE, AMYLASE in the last 168 hours. No results for input(s): AMMONIA in the last 168 hours. Coagulation Profile: No  results for input(s): INR, PROTIME  in the last 168 hours. Cardiac Enzymes: No results for input(s): CKTOTAL, CKMB, CKMBINDEX, TROPONINI, TROPONINIHS in the last 168 hours. BNP (last 3 results) Recent Labs    07/24/23 2323  BNP 3,972.0*   HbA1C: No results for input(s): HGBA1C in the last 72 hours. CBG: No results for input(s): GLUCAP in the last 168 hours. Lipid Profile: No results for input(s): CHOL, HDL, LDLCALC, TRIG, CHOLHDL, LDLDIRECT in the last 72 hours. Thyroid  Function Tests: No results for input(s): TSH, T4TOTAL, FREET4, T3FREE, THYROIDAB in the last 72 hours. Anemia Panel: No results for input(s): VITAMINB12, FOLATE, FERRITIN, TIBC, IRON, RETICCTPCT in the last 72 hours. Urine analysis:    Component Value Date/Time   COLORURINE YELLOW 03/13/2023 1511   APPEARANCEUR CLEAR 03/13/2023 1511   LABSPEC 1.012 03/13/2023 1511   PHURINE 5.0 03/13/2023 1511   GLUCOSEU NEGATIVE 03/13/2023 1511   HGBUR NEGATIVE 03/13/2023 1511   BILIRUBINUR NEGATIVE 03/13/2023 1511   KETONESUR NEGATIVE 03/13/2023 1511   PROTEINUR 30 (A) 03/13/2023 1511   UROBILINOGEN 0.2 02/15/2015 0417   NITRITE NEGATIVE 03/13/2023 1511   LEUKOCYTESUR NEGATIVE 03/13/2023 1511    Radiological Exams on Admission: I have personally reviewed images DG Tibia/Fibula Left Result Date: 01/18/2024 CLINICAL DATA:  Left lower leg pain for several hours, initial encounter EXAM: LEFT TIBIA AND FIBULA - 2 VIEW COMPARISON:  None Available. FINDINGS: No acute fracture or dislocation is noted. No soft tissue abnormality is seen. Diffuse vascular calcifications are noted. IMPRESSION: No acute abnormality noted. Electronically Signed   By: Oneil Devonshire M.D.   On: 01/18/2024 19:07     Assessment/Plan: Principal Problem:   Peripheral artery disease Active Problems:   PAD (peripheral artery disease) with history of multiple angioplasty and stent placement    Hyperlipidemia   Essential hypertension   Acute kidney injury superimposed on chronic kidney disease   Hyperkalemia   Hypertensive urgency   Sinus bradycardia   History of breast cancer   Paroxysmal atrial fibrillation (HCC)   History of COPD   History of CVA (cerebrovascular accident)   Hypothyroidism    Assessment and Plan: History of peripheral artery disease with extensive angioplasty and stent placement Left lower extremity PAD -Presented emergency department complaining of left-sided lateral aspect of pain and swelling that started over the course of last 24 hours.  Patient [facility] and transferred to [facility] ED for vascular evaluation - At presentation to ED patient found borderline hypertensive otherwise hemodynamically stable.  Blood pressure has been improved with pain medication. - Lab work showed mild leukocytosis.  BMP showing AKI and hyperkalemia. -In the ED patient has been evaluated by vascular surgery Dr.Brabham.  Given patient has biphasic left popliteal and peroneal signal and normal neurological exam at this time there is no surgical intervention required.  Vascular surgery will perform ABI index which has been ordered and if ABI index shows any stent blockage will do surgical intervention possibly tomorrow/daytime. -Last dose of Eliquis  today morning 10/11.  In the ED patient has been started on heparin  drip. -Plan to continue heparin  drip - Continue Plavix  and pravastatin .  Hypertensive urgency-improved Essential hypertension - At presentation to ED today patient found hypertensive however with pain management blood pressure has been improved. - In the setting of AKI holding torsemide .  Patient is not looking volume overloaded. - Continue IV hydralazine  as needed. - Holding metoprolol  in the setting of bradycardia heart rate around 50s to 60s range.  Hyperkalemia -Elevated potassium 5.4.  With calcium  gluconate and Lokelma .  Acute kidney  injury CKD stage IIIb -Elevated creatinine 1.94.  Baseline creatinine around 1.4-1.7.  Concern for prerenal acute kidney injury in the setting of hypertensive urgency and pain response - Continue low-dose maintenance fluid LR 75 cc/h.  Need to follow-up with renal function.  Checking UA, urine creatinine and sodium  Sinus bradycardia -Heart rate in between 48-59.  Holding Lopressor .  Continue amiodarone  for management of A-fib  History of paroxysmal atrial fibrillation on Eliquis  at home -Continue amiodarone .  Currently on heparin  drip and Eliquis  on hold  History of COPD -Stable  History of CVA -Continue Plavix  and pravastatin   Hypothyroidism -Continue thyroxine   DVT prophylaxis:  IV heparin  gtts Code Status:  Full Code Diet:  Family Communication:  *** Family was present at bedside, at the time of interview.  Opportunity was given to ask question and all questions were answered satisfactorily.  Disposition Plan:  ***  Consults:  ***  Admission status:   Inpatient, Step Down Unit  Severity of Illness: The appropriate patient status for this patient is INPATIENT. Inpatient status is judged to be reasonable and necessary in order to provide the required intensity of service to ensure the patient's safety. The patient's presenting symptoms, physical exam findings, and initial radiographic and laboratory data in the context of their chronic comorbidities is felt to place them at high risk for further clinical deterioration. Furthermore, it is not anticipated that the patient will be medically stable for discharge from the hospital within 2 midnights of admission.   * I certify that at the point of admission it is my clinical judgment that the patient will require inpatient hospital care spanning beyond 2 midnights from the point of admission due to high intensity of service, high risk for further deterioration and high frequency of surveillance required.DEWAINE    Ralph Benavidez,  MD [facility]  How to contact the [facility] Attending or Consulting provider 7A - 7P or covering provider during after hours 7P -7A, for this patient.  Check the care team in [facility] and look for a) attending/consulting [facility] provider listed and b) the [facility] team listed Log into [email or web address] and use [facility] universal password to access. If you do not have the password, please contact the hospital operator. Locate the [facility] provider you are looking for under [facility] and page to a number that you can be directly reached. If you still have difficulty reaching the provider, please page the [facility] (Director on Call) for the Hospitalists listed on amion for assistance.  01/18/2024, 10:50 PM

## 2024-01-18 NOTE — ED Notes (Signed)
 Pt to bathroom via wheelchair with NT. Pt in NAD.

## 2024-01-19 ENCOUNTER — Inpatient Hospital Stay (HOSPITAL_COMMUNITY)

## 2024-01-19 DIAGNOSIS — M79605 Pain in left leg: Secondary | ICD-10-CM | POA: Diagnosis not present

## 2024-01-19 LAB — COMPREHENSIVE METABOLIC PANEL WITH GFR
ALT: 16 U/L (ref 0–44)
AST: 23 U/L (ref 15–41)
Albumin: 2.8 g/dL — ABNORMAL LOW (ref 3.5–5.0)
Alkaline Phosphatase: 65 U/L (ref 38–126)
Anion gap: 12 (ref 5–15)
BUN: 42 mg/dL — ABNORMAL HIGH (ref 8–23)
CO2: 29 mmol/L (ref 22–32)
Calcium: 10.1 mg/dL (ref 8.9–10.3)
Chloride: 98 mmol/L (ref 98–111)
Creatinine, Ser: 1.77 mg/dL — ABNORMAL HIGH (ref 0.44–1.00)
GFR, Estimated: 28 mL/min — ABNORMAL LOW (ref >60)
Glucose, Bld: 101 mg/dL — ABNORMAL HIGH (ref 70–99)
Potassium: 3.9 mmol/L (ref 3.5–5.1)
Sodium: 139 mmol/L (ref 135–145)
Total Bilirubin: 0.7 mg/dL (ref 0.0–1.2)
Total Protein: 6.7 g/dL (ref 6.5–8.1)

## 2024-01-19 LAB — CBC
HCT: 34.3 % — ABNORMAL LOW (ref 36.0–46.0)
Hemoglobin: 11.3 g/dL — ABNORMAL LOW (ref 12.0–15.0)
MCH: 32.4 pg (ref 26.0–34.0)
MCHC: 32.9 g/dL (ref 30.0–36.0)
MCV: 98.3 fL (ref 80.0–100.0)
Platelets: 309 K/uL (ref 150–400)
RBC: 3.49 MIL/uL — ABNORMAL LOW (ref 3.87–5.11)
RDW: 16.1 % — ABNORMAL HIGH (ref 11.5–15.5)
WBC: 10.8 K/uL — ABNORMAL HIGH (ref 4.0–10.5)
nRBC: 0 % (ref 0.0–0.2)

## 2024-01-19 LAB — CREATININE, URINE, RANDOM: Creatinine, Urine: 49 mg/dL

## 2024-01-19 LAB — APTT
aPTT: 70 s — ABNORMAL HIGH (ref 24–36)
aPTT: 66 s — ABNORMAL HIGH (ref 24–36)

## 2024-01-19 LAB — SODIUM, URINE, RANDOM: Sodium, Ur: 32 mmol/L

## 2024-01-19 LAB — HEPARIN LEVEL (UNFRACTIONATED): Heparin Unfractionated: >1.1 [IU]/mL — ABNORMAL HIGH (ref 0.30–0.70)

## 2024-01-19 MED ORDER — HYDRALAZINE HCL 20 MG/ML IJ SOLN
10.0000 mg | Freq: Four times a day (QID) | INTRAMUSCULAR | Status: DC | PRN
  Administered 2024-01-22: 10 mg via INTRAVENOUS
  Filled 2024-01-19: qty 1

## 2024-01-19 MED ORDER — INFLUENZA VAC SPLIT HIGH-DOSE 0.5 ML IM SUSY
0.5000 mL | PREFILLED_SYRINGE | INTRAMUSCULAR | Status: AC
  Administered 2024-01-21: 0.5 mL via INTRAMUSCULAR
  Filled 2024-01-19: qty 0.5

## 2024-01-19 NOTE — Plan of Care (Signed)
  Problem: Clinical Measurements: Goal: Respiratory complications will improve Outcome: Completed/Met   Problem: Coping: Goal: Level of anxiety will decrease Outcome: Completed/Met   

## 2024-01-19 NOTE — Hospital Course (Signed)
 Patient is an 83 year old female with PMHx of PVD status post multiple angioplasties and stents, breast cancer status postmastectomy, A-fib on Eliquis , CAD, CKD stage IIIb, anemia of chronic disease, HTN, HLD, meningioma, history of necrotizing pneumonia, COPD, gastric ulcer, remote CVA who presented to [facility] ED on 01/18/2024 with complaints of left leg pain and swelling with inability to bear weight since the morning.  She was transferred to [facility] for evaluation by vascular surgery.

## 2024-01-19 NOTE — Progress Notes (Signed)
 PHARMACY - ANTICOAGULATION CONSULT NOTE  Pharmacy Consult for heparin  Indication: Ischemic limb  Allergies  Allergen Reactions   Iodine  Rash and Other (See Comments)    BETADINE, blistering rash/burns   Iohexol  Rash and Other (See Comments)    Blisters; PT NEEDS 13-HOUR PREP    Petroleum Gauze Non-Woven 3x9 [Wound Dressings] Other (See Comments)    BURNING SENSATION, RED SKIN   Sulfa  Antibiotics Nausea And Vomiting   Wound Dressing Adhesive Other (See Comments)    Burns skin   Tape Itching, Rash and Other (See Comments)    DO NOT USE ADHESIVE TAPE Not even a Band Aid NO PAPER TAPE Burns/tears skin Skin is very very thin    Patient Measurements: Height: 5' 3 (160 cm) Weight: 37.5 kg (82 lb 11.2 oz) IBW/kg (Calculated) : 52.4 HEPARIN  DW (KG): 37.5  Vital Signs: Temp: 98 F (36.7 C) (10/12 0705) Temp Source: Oral (10/12 0705) BP: 112/45 (10/12 0705) Pulse Rate: 61 (10/12 0705)  Labs: Recent Labs    01/18/24 1749 01/19/24 0723  HGB 12.1 11.3*  HCT 36.7 34.3*  PLT 357 309  APTT  --  70*  HEPARINUNFRC  --  >1.10*  CREATININE 1.94* 1.77*    Estimated Creatinine Clearance: 13.8 mL/min (A) (by C-G formula based on SCr of 1.77 mg/dL (H)).   Medical History: Past Medical History:  Diagnosis Date   Anemia 05/2014   Atrial fibrillation (HCC)    x 3 yrs   Breast cancer (HCC)    S/P left mastectomy and chemotherapy 1989 remained in remission   Carotid artery occlusion    Carotid bruit    LICA 60-79% (duplex 9/11)   Degenerative arthritis of spine 2015   Chronic back pain   Hyperlipidemia    Hypertension    Meningioma (HCC)    Osteoporosis    Peripheral arterial disease    Pneumonia 05/19/2014   Stroke [facility])    CVA 2008   Subclavian steal syndrome    Ulcers of both lower extremities (HCC) 2015     Assessment: 83 YOF presenting with ischemia of left leg, on Eliquis  PTA for afib with last dose 10/11 AM  10/12 AM: aPTT therapeutic at 70; heparin   level >1.1 at heparin  600 units/hr which is expected given recent dose of eliquis . CBC stable. No s/sx of bleeding noted per RN.   Goal of Therapy:  Heparin  level 0.3-0.7 units/ml aPTT 66-102 seconds Monitor platelets by anticoagulation protocol: Yes   Plan:  Continue Heparin  gtt at 600 units/hr F/u 8 hour aPTT Daily aPTT and heparin  level until levels are correlating Daily CBC for s/sx of bleeding    R. Samual Satterfield, PharmD PGY-1 Acute Care Pharmacy Resident [facility] Please refer to [facility] for [facility] Pharmacy numbers 01/19/2024 9:08 AM

## 2024-01-19 NOTE — Progress Notes (Signed)
 VASCULAR LAB    Bilateral lower extremity venous duplex has been performed.  See CV proc for preliminary results.   Jermale Crass, RVT 01/19/2024, 3:20 PM

## 2024-01-19 NOTE — Progress Notes (Signed)
 Progress Note   Patient: [name] FMW:[id number] DOB: 1941/02/25 DOA: 01/18/2024     1 DOS: the patient was seen and examined on 01/19/2024   Brief hospital course: Patient is an 83 year old female with PMHx of PVD status post multiple angioplasties and stents, breast cancer status postmastectomy, A-fib on Eliquis , CAD, CKD stage IIIb, anemia of chronic disease, HTN, HLD, meningioma, history of necrotizing pneumonia, COPD, gastric ulcer, remote CVA who presented to [facility] ED on 01/18/2024 with complaints of left leg pain and swelling with inability to bear weight since the morning.  She was transferred to [facility] for evaluation by vascular surgery.  Assessment and Plan:  History of peripheral artery disease with extensive angioplasty and stent placement Left lower extremity PAD -Presented emergency department complaining of left-sided lateral aspect of pain and swelling that started over the course of last 24 hours.   -X-ray of left tib-fib negative for acute abnormalities -Evaluated by vascular surgery Dr. Serene, found to have biphasic left popliteal and peroneal signal and normal neurological exam, indicated no emergent surgical intervention required. Recommended ABIs.  -ABIs pending -Last dose of Eliquis  was morning of 10/11.   -Heparin  drip started in anticipation of any intervention  - Continue Plavix  and pravastatin    Hypertensive urgency-improved Essential hypertension - Hypertensive on arrival to the ED.  Improved with pain management. -On torsemide  held in the setting of AKI.  Patient euvolemic - Continue IV hydralazine  as needed. - Holding metoprolol  in the setting of bradycardia heart rate around 50s to 60s range.   Hyperkalemia -- resolved -Elevated potassium 5.4.  Treated with calcium gluconate and Lokelma .   Acute kidney injury CKD stage IIIb -- resolved -Elevated creatinine 1.94.  Baseline creatinine around 1.4-1.7.   -Resolved with IVFs   Sinus  bradycardia -Heart rate in between 48-59.  Holding Lopressor .  Continue amiodarone  for management of A-fib   History of paroxysmal atrial fibrillation on Eliquis  at home -Continue amiodarone .  Currently on heparin  drip and Eliquis  on hold   History of COPD -Stable   History of CVA -Continue Plavix  and pravastatin    Hypothyroidism -Continue levothyroxine        Subjective: Patient seen at bedside today.  Patient's husband was also at bedside.  Patient reports doing well overall.  States the pain comes on when she moves her leg or bears weight on it.  Also very tender to touch focally over the mid lateral anterior shin.  Otherwise she is doing well and eating breakfast without any concerns.  She denies any chest pain, abdominal pain, nausea, vomiting, fevers, chills.  Physical Exam: Vitals:   01/19/24 0417 01/19/24 0635 01/19/24 0640 01/19/24 0705  BP: (!) 160/62  (!) 144/51 (!) 112/45  Pulse: (!) 57  (!) 51 61  Resp: 11  20 18   Temp:   97.9 F (36.6 C) 98 F (36.7 C)  TempSrc:   Oral Oral  SpO2: 96%  90% 92%  Weight:  37.5 kg    Height:  5' 3 (1.6 m)     Physical Exam Constitutional:      General: She is not in acute distress.    Appearance: She is underweight. She is not ill-appearing.  HENT:     Mouth/Throat:     Mouth: Mucous membranes are moist.  Eyes:     Pupils: Pupils are equal, round, and reactive to light.  Cardiovascular:     Rate and Rhythm: Normal rate and regular rhythm.     Heart  sounds: Normal heart sounds. No murmur heard. Pulmonary:     Effort: Pulmonary effort is normal. No respiratory distress.     Breath sounds: Normal breath sounds. No wheezing.  Abdominal:     General: Bowel sounds are normal. There is no distension.     Palpations: Abdomen is soft.     Tenderness: There is no abdominal tenderness. There is no guarding.  Musculoskeletal:     Right lower leg: No edema.     Left lower leg: No edema.     Comments: Focal tenderness over the  anterolateral aspect of the mid shin without erythema, warmth, or appreciable skin changes  Skin:    General: Skin is warm and dry.     Capillary Refill: Capillary refill takes less than 2 seconds.  Neurological:     Mental Status: She is alert and oriented to person, place, and time. Mental status is at baseline.      Family Communication: Discussed with patient and her husband at bedside  Disposition: Status is: Inpatient Remains inpatient appropriate because: Requiring further evaluation possible vascular surgery intervention  Planned Discharge Destination: Home    Time spent: 45 minutes  Author: Duffy Larch, MD 01/19/2024 10:55 AM  For on call review [email or web address].

## 2024-01-19 NOTE — Progress Notes (Signed)
 PHARMACY - ANTICOAGULATION CONSULT NOTE  Pharmacy Consult for heparin  Indication: Ischemic limb  Allergies  Allergen Reactions   Iodine  Rash and Other (See Comments)    BETADINE, blistering rash/burns   Iohexol  Rash and Other (See Comments)    Blisters; PT NEEDS 13-HOUR PREP    Petroleum Gauze Non-Woven 3x9 [Wound Dressings] Other (See Comments)    BURNING SENSATION, RED SKIN   Sulfa  Antibiotics Nausea And Vomiting   Wound Dressing Adhesive Other (See Comments)    Burns skin   Tape Itching, Rash and Other (See Comments)    DO NOT USE ADHESIVE TAPE Not even a Band Aid NO PAPER TAPE Burns/tears skin Skin is very very thin    Patient Measurements: Height: 5' 3 (160 cm) Weight: 37.5 kg (82 lb 11.2 oz) IBW/kg (Calculated) : 52.4 HEPARIN  DW (KG): 37.5  Vital Signs: Temp: 98 F (36.7 C) (10/12 1549) Temp Source: Oral (10/12 1549) BP: 134/47 (10/12 1549) Pulse Rate: 66 (10/12 1549)  Labs: Recent Labs    01/18/24 1749 01/19/24 0723 01/19/24 1644  HGB 12.1 11.3*  --   HCT 36.7 34.3*  --   PLT 357 309  --   APTT  --  70* 66*  HEPARINUNFRC  --  >1.10*  --   CREATININE 1.94* 1.77*  --     Estimated Creatinine Clearance: 13.8 mL/min (A) (by C-G formula based on SCr of 1.77 mg/dL (H)).   Medical History: Past Medical History:  Diagnosis Date   Anemia 05/2014   Atrial fibrillation (HCC)    x 3 yrs   Breast cancer (HCC)    S/P left mastectomy and chemotherapy 1989 remained in remission   Carotid artery occlusion    Carotid bruit    LICA 60-79% (duplex 9/11)   Degenerative arthritis of spine 2015   Chronic back pain   Hyperlipidemia    Hypertension    Meningioma (HCC)    Osteoporosis    Peripheral arterial disease    Pneumonia 05/19/2014   Stroke [facility])    CVA 2008   Subclavian steal syndrome    Ulcers of both lower extremities (HCC) 2015     Assessment: 83 YOF presenting with ischemia of left leg, on Eliquis  PTA for afib with last dose 10/11  AM  10/12 AM: aPTT therapeutic at 70; heparin  level >1.1 at heparin  600 units/hr which is expected given recent dose of eliquis . CBC stable. No s/sx of bleeding noted per RN  PM: aPTT 66 is at low end of therapeutic range. No issues with the heparin  infusion or bleeding reported.   Goal of Therapy:  Heparin  level 0.3-0.7 units/ml aPTT 66-102 seconds Monitor platelets by anticoagulation protocol: Yes   Plan:  Increase Heparin  gtt to 650 units/hr F/u 8 hour aPTT Daily aPTT and heparin  level until levels are correlating Daily CBC for s/sx of bleeding    Rocky Slade, PharmD, BCPS Please refer to [facility] for [facility] Pharmacy numbers 01/19/2024 6:03 PM

## 2024-01-20 ENCOUNTER — Encounter (HOSPITAL_COMMUNITY): Admission: EM | Disposition: A | Discharge status: Home Health | Payer: Self-pay | Source: Home / Self Care

## 2024-01-20 DIAGNOSIS — S81802A Unspecified open wound, left lower leg, initial encounter: Secondary | ICD-10-CM | POA: Diagnosis not present

## 2024-01-20 DIAGNOSIS — I70202 Unspecified atherosclerosis of native arteries of extremities, left leg: Secondary | ICD-10-CM | POA: Diagnosis not present

## 2024-01-20 HISTORY — PX: LOWER EXTREMITY ANGIOGRAPHY: CATH118251

## 2024-01-20 HISTORY — PX: LOWER EXTREMITY INTERVENTION: CATH118252

## 2024-01-20 HISTORY — PX: PERIPHERAL INTRAVASCULAR LITHOTRIPSY: CATH118324

## 2024-01-20 LAB — CBC
HCT: 30.4 % — ABNORMAL LOW (ref 36.0–46.0)
Hemoglobin: 10.1 g/dL — ABNORMAL LOW (ref 12.0–15.0)
MCH: 33.1 pg (ref 26.0–34.0)
MCHC: 33.2 g/dL (ref 30.0–36.0)
MCV: 99.7 fL (ref 80.0–100.0)
Platelets: 297 K/uL (ref 150–400)
RBC: 3.05 MIL/uL — ABNORMAL LOW (ref 3.87–5.11)
RDW: 16.1 % — ABNORMAL HIGH (ref 11.5–15.5)
WBC: 11 K/uL — ABNORMAL HIGH (ref 4.0–10.5)
nRBC: 0 % (ref 0.0–0.2)

## 2024-01-20 LAB — APTT
aPTT: 117 s — ABNORMAL HIGH (ref 24–36)
aPTT: 99 s — ABNORMAL HIGH (ref 24–36)

## 2024-01-20 LAB — POCT ACTIVATED CLOTTING TIME
Activated Clotting Time: 239 s
Activated Clotting Time: 216 s

## 2024-01-20 LAB — HEPARIN LEVEL (UNFRACTIONATED): Heparin Unfractionated: >1.1 [IU]/mL — ABNORMAL HIGH (ref 0.30–0.70)

## 2024-01-20 SURGERY — LOWER EXTREMITY ANGIOGRAPHY
Anesthesia: LOCAL

## 2024-01-20 MED ORDER — SODIUM CHLORIDE 0.9 % WEIGHT BASED INFUSION: 1.0000 mL/kg/h | INTRAVENOUS | Status: AC

## 2024-01-20 MED ORDER — METHYLPREDNISOLONE SODIUM SUCC 125 MG IJ SOLR
125.0000 mg | Freq: Once | INTRAMUSCULAR | Status: AC
  Administered 2024-01-20: 125 mg via INTRAVENOUS
  Filled 2024-01-20: qty 2

## 2024-01-20 MED ORDER — HEPARIN (PORCINE) IN NACL 1000-0.9 UT/500ML-% IV SOLN
INTRAVENOUS | Status: DC | PRN
  Administered 2024-01-20: 1000 mL

## 2024-01-20 MED ORDER — LIDOCAINE HCL (PF) 1 % IJ SOLN
INTRAMUSCULAR | Status: AC
  Filled 2024-01-20: qty 30

## 2024-01-20 MED ORDER — ATORVASTATIN CALCIUM 40 MG PO TABS: 40.0000 mg | ORAL_TABLET | Freq: Every day | ORAL | Status: DC

## 2024-01-20 MED ORDER — HYDRALAZINE HCL 20 MG/ML IJ SOLN: 5.0000 mg | INTRAMUSCULAR | Status: DC | PRN

## 2024-01-20 MED ORDER — HEPARIN SODIUM (PORCINE) 1000 UNIT/ML IJ SOLN
INTRAMUSCULAR | Status: DC | PRN
  Administered 2024-01-20: 4000 [IU] via INTRAVENOUS

## 2024-01-20 MED ORDER — LIDOCAINE HCL (PF) 1 % IJ SOLN
INTRAMUSCULAR | Status: DC | PRN
  Administered 2024-01-20: 5 mL

## 2024-01-20 MED ORDER — SODIUM CHLORIDE 0.9% FLUSH
3.0000 mL | Freq: Two times a day (BID) | INTRAVENOUS | Status: DC
  Administered 2024-01-21 – 2024-01-22 (×2): 3 mL via INTRAVENOUS

## 2024-01-20 MED ORDER — LABETALOL HCL 5 MG/ML IV SOLN
INTRAVENOUS | Status: AC
  Filled 2024-01-20: qty 4

## 2024-01-20 MED ORDER — SODIUM CHLORIDE 0.9 % IV SOLN: 250.0000 mL | INTRAVENOUS | Status: AC | PRN

## 2024-01-20 MED ORDER — HEPARIN (PORCINE) 25000 UT/250ML-% IV SOLN
600.0000 [IU]/h | INTRAVENOUS | Status: DC
  Administered 2024-01-21: 600 [IU]/h via INTRAVENOUS
  Filled 2024-01-20: qty 250

## 2024-01-20 MED ORDER — FENTANYL CITRATE (PF) 100 MCG/2ML IJ SOLN
INTRAMUSCULAR | Status: AC
  Filled 2024-01-20: qty 2

## 2024-01-20 MED ORDER — MIDAZOLAM HCL 2 MG/2ML IJ SOLN
INTRAMUSCULAR | Status: DC | PRN
  Administered 2024-01-20: 1 mg via INTRAVENOUS

## 2024-01-20 MED ORDER — FENTANYL CITRATE (PF) 100 MCG/2ML IJ SOLN
INTRAMUSCULAR | Status: DC | PRN
  Administered 2024-01-20: 50 ug via INTRAVENOUS

## 2024-01-20 MED ORDER — IODIXANOL 320 MG/ML IV SOLN
INTRAVENOUS | Status: DC | PRN
  Administered 2024-01-20: 60 mL

## 2024-01-20 MED ORDER — LABETALOL HCL 5 MG/ML IV SOLN
10.0000 mg | INTRAVENOUS | Status: DC | PRN
  Administered 2024-01-20: 10 mg via INTRAVENOUS

## 2024-01-20 MED ORDER — SODIUM CHLORIDE 0.9% FLUSH: 3.0000 mL | INTRAVENOUS | Status: DC | PRN

## 2024-01-20 MED ORDER — OXYCODONE HCL 5 MG PO TABS
5.0000 mg | ORAL_TABLET | ORAL | Status: DC | PRN
  Administered 2024-01-22 (×2): 5 mg via ORAL
  Filled 2024-01-20 (×2): qty 1

## 2024-01-20 MED ORDER — HEPARIN SODIUM (PORCINE) 1000 UNIT/ML IJ SOLN
INTRAMUSCULAR | Status: AC
  Filled 2024-01-20: qty 10

## 2024-01-20 MED ORDER — SODIUM CHLORIDE 0.9 % IV SOLN: INTRAVENOUS | Status: DC

## 2024-01-20 MED ORDER — DIPHENHYDRAMINE HCL 50 MG/ML IJ SOLN
25.0000 mg | Freq: Once | INTRAMUSCULAR | Status: AC
  Administered 2024-01-20: 25 mg via INTRAVENOUS
  Filled 2024-01-20: qty 1

## 2024-01-20 MED ORDER — MIDAZOLAM HCL 2 MG/2ML IJ SOLN
INTRAMUSCULAR | Status: AC
  Filled 2024-01-20: qty 2

## 2024-01-20 MED ORDER — ACETAMINOPHEN 325 MG PO TABS: 650.0000 mg | ORAL_TABLET | ORAL | Status: DC | PRN

## 2024-01-20 SURGICAL SUPPLY — 16 items
CATH OMNI FLUSH 5F 65CM (CATHETERS) IMPLANT
CATH QUICKCROSS SUPP .035X90CM (MICROCATHETER) IMPLANT
DCB RANGER 5.0X100 135 (BALLOONS) IMPLANT
DCB RANGER 6.0X80 135 (BALLOONS) IMPLANT
GLIDEWIRE ADV .035X260CM (WIRE) IMPLANT
KIT ENCORE 40 (KITS) IMPLANT
KIT MICROPUNCTURE NIT STIFF (SHEATH) IMPLANT
KIT SINGLE USE MANIFOLD (KITS) IMPLANT
PACK CARDIAC CATHETERIZATION (CUSTOM PROCEDURE TRAY) IMPLANT
SET ATX-X65L (MISCELLANEOUS) IMPLANT
SHEATH CATAPULT 6FR 45 (SHEATH) IMPLANT
SHEATH PINNACLE 5F 10CM (SHEATH) IMPLANT
SHEATH PINNACLE 6F 10CM (SHEATH) IMPLANT
SHEATH PROBE COVER 6X72 (BAG) IMPLANT
WIRE BENTSON .035X145CM (WIRE) IMPLANT
WIRE SPARTACORE .014X300CM (WIRE) IMPLANT

## 2024-01-20 NOTE — Progress Notes (Signed)
 Mobility Specialist Progress Note:    01/20/24 1042  Mobility  Activity Turned to back - supine (Ankle Pump, leg ext.)  Level of Assistance Standby assist, set-up cues, supervision of patient - no hands on  Assistive Device None  Range of Motion/Exercises Left leg;Right leg  Activity Response Tolerated fair (Pt stating left calf is in pain. RN in room)  Mobility Referral Yes  Mobility visit 1 Mobility  Mobility Specialist Start Time (ACUTE ONLY) 1042  Mobility Specialist Stop Time (ACUTE ONLY) 1050  Mobility Specialist Time Calculation (min) (ACUTE ONLY) 8 min   Received pt w/ husband and RN in room. Pt doesn't want to ambulate but was able to demonstrate some bed level exercises. Pt stating that spot on left calf hurts w/ pressure ambulating or to touch. RN in room notified. Left pt in bed w/ all needs met.   Venetia Keel Mobility Specialist Please Neurosurgeon or Rehab Office at [phone]

## 2024-01-20 NOTE — CV Procedure (Signed)
 Sheath removal note 2150 ACT 153   2157 PreVS- 158/77 SR 66 6 fr sheath right femoral artery Right DP- plus 1 very weak but present  2200 6 french sheath removed manual pressure held 20 min VS remain stable and unchanged  2225 VS 152/66 SR 61  Right femoral site level 0  Post procedure instructions given and patient verbally states understanding  Charge RN Powell present and assessed/confirmed vascular site level 0

## 2024-01-20 NOTE — Progress Notes (Signed)
 PHARMACY - ANTICOAGULATION CONSULT NOTE  Pharmacy Consult for heparin  Indication: Ischemic limb  Allergies  Allergen Reactions   Iodine  Rash and Other (See Comments)    BETADINE, blistering rash/burns   Iohexol  Rash and Other (See Comments)    Blisters; PT NEEDS 13-HOUR PREP    Petroleum Gauze Non-Woven 3x9 [Wound Dressings] Other (See Comments)    BURNING SENSATION, RED SKIN   Sulfa  Antibiotics Nausea And Vomiting   Wound Dressing Adhesive Other (See Comments)    Burns skin   Tape Itching, Rash and Other (See Comments)    DO NOT USE ADHESIVE TAPE Not even a Band Aid NO PAPER TAPE Burns/tears skin Skin is very very thin    Patient Measurements: Height: 5' 3 (160 cm) Weight: 37.5 kg (82 lb 11.2 oz) IBW/kg (Calculated) : 52.4 HEPARIN  DW (KG): 37.5  Vital Signs: Temp: 97.9 F (36.6 C) (10/13 0734) Temp Source: Oral (10/13 0734) BP: 173/67 (10/13 0734) Pulse Rate: 57 (10/13 0734)  Labs: Recent Labs    01/18/24 1749 01/18/24 1749 01/19/24 0723 01/19/24 1644 01/20/24 0317 01/20/24 1222  HGB 12.1  --  11.3*  --  10.1*  --   HCT 36.7  --  34.3*  --  30.4*  --   PLT 357  --  309  --  297  --   APTT  --    < > 70* 66* 117* 99*  HEPARINUNFRC  --   --  >1.10*  --  >1.10*  --   CREATININE 1.94*  --  1.77*  --   --   --    < > = values in this interval not displayed.    Estimated Creatinine Clearance: 13.8 mL/min (A) (by C-G formula based on SCr of 1.77 mg/dL (H)).   Medical History: Past Medical History:  Diagnosis Date   Anemia 05/2014   Atrial fibrillation (HCC)    x 3 yrs   Breast cancer (HCC)    S/P left mastectomy and chemotherapy 1989 remained in remission   Carotid artery occlusion    Carotid bruit    LICA 60-79% (duplex 9/11)   Degenerative arthritis of spine 2015   Chronic back pain   Hyperlipidemia    Hypertension    Meningioma (HCC)    Osteoporosis    Peripheral arterial disease    Pneumonia 05/19/2014   Stroke [facility])    CVA 2008    Subclavian steal syndrome    Ulcers of both lower extremities (HCC) 2015     Assessment: 75 YOF presenting with ischemia of left leg, on Eliquis  PTA for afib with last dose 10/11 AM  -aPTT 99 and at goal on 600 units/hr -plans noted for angiography  Goal of Therapy:  Heparin  level 0.3-0.7 units/ml aPTT 66-102 seconds Monitor platelets by anticoagulation protocol: Yes   Plan:  Continue Heparin  650 units/hr Daily aPTT and heparin  level until levels are correlating Daily CBC for s/sx of bleeding  Will follow plans post procedure  Prentice Poisson, PharmD Clinical Pharmacist **Pharmacist phone directory can now be found on [email or web address] ([email or web address]).  Listed under [facility].

## 2024-01-20 NOTE — Progress Notes (Signed)
    Subjective  -   Continued left leg pain   Physical Exam:  Palpable femoral pulses   Duplex: Right: Focal 75-99% stenosis noted in the mid superficial femoral artery.   Left: 75-99% stenosis noted at the distal superficial femoral  artery/proximal popliteal artery. Patent stent with no evidence of  stenosis in the superficial femoral artery.     Assessment/Plan:    Left leg pain:  I do not think that her left leg pain based on its location is arterial in nature, however her duplex yesterday suggested left SFA/POP stenosis.  Given her wound issues, I have recommended angio today   [name] 01/20/2024 8:55 AM --  Vitals:   01/20/24 0400 01/20/24 0734  BP: (!) 161/63 (!) 173/67  Pulse: 73 (!) 57  Resp: 14 17  Temp: (!) 97.1 F (36.2 C) 97.9 F (36.6 C)  SpO2: 93% 98%    Intake/Output Summary (Last 24 hours) at 01/20/2024 0855 Last data filed at 01/20/2024 0400 Gross per 24 hour  Intake 1739.85 ml  Output --  Net 1739.85 ml     Laboratory CBC    Component Value Date/Time   WBC 11.0 (H) 01/20/2024 0317   HGB 10.1 (L) 01/20/2024 0317   HGB 13.3 07/30/2022 0931   HCT 30.4 (L) 01/20/2024 0317   HCT 40.4 07/30/2022 0931   PLT 297 01/20/2024 0317   PLT 289 07/30/2022 0931    BMET    Component Value Date/Time   NA 139 01/19/2024 0723   NA 139 01/06/2024 1034   K 3.9 01/19/2024 0723   CL 98 01/19/2024 0723   CO2 29 01/19/2024 0723   GLUCOSE 101 (H) 01/19/2024 0723   BUN 42 (H) 01/19/2024 0723   BUN 49 (H) 01/06/2024 1034   CREATININE 1.77 (H) 01/19/2024 0723   CREATININE 0.57 (L) 03/14/2015 1230   CALCIUM 10.1 01/19/2024 0723   GFRNONAA 28 (L) 01/19/2024 0723   GFRNONAA 36 11/16/2022 1454   GFRAA >60 02/20/2015 0418    COAG Lab Results  Component Value Date   INR 1.4 (H) 12/25/2021   INR 1.44 02/17/2015   INR 1.43 02/16/2015   No results found for: PTT  Antibiotics Anti-infectives (From admission, onward)    None         V. Malvina Serene CLORE, M.D., [facility] [place] Office: [phone] Pager:  [phone]

## 2024-01-20 NOTE — Plan of Care (Signed)
  Problem: Education: Goal: Knowledge of General Education information will improve Description: Including pain rating scale, medication(s)/side effects and non-pharmacologic comfort measures Outcome: Progressing   Problem: Health Behavior/Discharge Planning: Goal: Ability to manage health-related needs will improve Outcome: Progressing   Problem: Clinical Measurements: Goal: Ability to maintain clinical measurements within normal limits will improve Outcome: Progressing Goal: Will remain free from infection Outcome: Progressing Goal: Diagnostic test results will improve Outcome: Progressing Goal: Cardiovascular complication will be avoided Outcome: Progressing   Problem: Activity: Goal: Risk for activity intolerance will decrease Outcome: Progressing   Problem: Nutrition: Goal: Adequate nutrition will be maintained Outcome: Progressing   Problem: Elimination: Goal: Will not experience complications related to bowel motility Outcome: Progressing Goal: Will not experience complications related to urinary retention Outcome: Progressing   Problem: Pain Managment: Goal: General experience of comfort will improve and/or be controlled Outcome: Progressing   Problem: Safety: Goal: Ability to remain free from injury will improve Outcome: Progressing   Problem: Skin Integrity: Goal: Risk for impaired skin integrity will decrease Outcome: Progressing

## 2024-01-20 NOTE — Progress Notes (Signed)
 PHARMACY - ANTICOAGULATION CONSULT NOTE  Pharmacy Consult for heparin  Indication: ischemic limb  Labs: Recent Labs    01/18/24 1749 01/19/24 0723 01/19/24 1644 01/20/24 0317  HGB 12.1 11.3*  --  10.1*  HCT 36.7 34.3*  --  30.4*  PLT 357 309  --  297  APTT  --  70* 66* 117*  HEPARINUNFRC  --  >1.10*  --  >1.10*  CREATININE 1.94* 1.77*  --   --    Assessment: 83yo female now supratherapeutic on heparin  after minimal rate change; no infusion issues or signs of bleeding per RN.  Goal of Therapy:  aPTT 66-102 seconds   Plan:  Decrease heparin  infusion back to 600 units/hr. Check level in 8 hours.   Marvetta Dauphin, PharmD, BCPS 01/20/2024 4:02 AM

## 2024-01-20 NOTE — Progress Notes (Signed)
 Progress Note   Patient: [name] FMW:[id number] DOB: 09-15-1940 DOA: 01/18/2024     2 DOS: the patient was seen and examined on 01/20/2024   Brief hospital course: Patient is an 83 year old female with PMHx of PVD status post multiple angioplasties and stents, breast cancer status postmastectomy, A-fib on Eliquis , CAD, CKD stage IIIb, anemia of chronic disease, HTN, HLD, meningioma, history of necrotizing pneumonia, COPD, gastric ulcer, remote CVA who presented to [facility] ED on 01/18/2024 with complaints of left leg pain and swelling with inability to bear weight since the morning.  She was transferred to [facility] for evaluation by vascular surgery.  Assessment and Plan:  History of peripheral artery disease with extensive angioplasty and stent placement Left lower extremity PAD -Presented emergency department complaining of left-sided lateral aspect of pain and swelling that started over the course of last 24 hours.   -X-ray of left tib-fib negative for acute abnormalities -Evaluated by vascular surgery Dr. Serene, found to have biphasic left popliteal and peroneal signal and normal neurological exam, indicated no emergent surgical intervention required. Recommended ABIs.  -Last dose of Eliquis  was morning of 10/11.   -Heparin  drip started in anticipation of any intervention  -ABIs showed left SFA/POP stenosis - Continue Plavix  and pravastatin  -Vascular surgery planning for angiography this afternoon   Hypertensive urgency-improved Essential hypertension - Hypertensive on arrival to the ED.  Improved with pain management. - On torsemide  held in the setting of AKI.  Patient euvolemic - Continue IV hydralazine  as needed. - Holding metoprolol  in the setting of bradycardia heart rate around 50s to 60s range.   Hyperkalemia -- resolved -Elevated potassium 5.4.  Treated with calcium gluconate and Lokelma .   Acute kidney injury CKD stage IIIb -- resolved -Elevated  creatinine 1.94.  Baseline creatinine around 1.4-1.7.   -Resolved with IVFs   Sinus bradycardia -Heart rate in between 48-59.  Holding Lopressor .  Continue amiodarone  for management of A-fib   History of paroxysmal atrial fibrillation on Eliquis  at home -Continue amiodarone .  Currently on heparin  drip and Eliquis  on hold   History of COPD -Stable   History of CVA -Continue Plavix  and pravastatin    Hypothyroidism -Continue levothyroxine        Subjective: Patient seen at bedside today.  Patient's husband was also at bedside.  Patient reports doing well overall.  She was informed by Dr. Serene about the arterial blockages in her left leg.  They are eager to know when her procedure will be today.  She is also concerned about the fact that her pain may not resolve after the procedure.  Discussed that we can address that after procedure if that is the case.  Physical Exam: Vitals:   01/20/24 1741 01/20/24 1800 01/20/24 1815 01/20/24 1854  BP: (!) 178/90 126/67 117/66 (!) 148/62  Pulse: 91 88 81 75  Resp: 16 (!) 22 16 17   Temp:    97.8 F (36.6 C)  TempSrc:    Oral  SpO2: 92% (!) 89% 91% 93%  Weight:      Height:       Physical Exam Constitutional:      General: She is not in acute distress.    Appearance: She is underweight. She is not ill-appearing.  HENT:     Mouth/Throat:     Mouth: Mucous membranes are moist.  Eyes:     Pupils: Pupils are equal, round, and reactive to light.  Cardiovascular:     Rate and Rhythm: Normal rate  and regular rhythm.     Heart sounds: Normal heart sounds. No murmur heard. Pulmonary:     Effort: Pulmonary effort is normal. No respiratory distress.     Breath sounds: Normal breath sounds. No wheezing.  Abdominal:     General: Bowel sounds are normal. There is no distension.     Palpations: Abdomen is soft.     Tenderness: There is no abdominal tenderness. There is no guarding.  Musculoskeletal:     Right lower leg: No edema.     Left  lower leg: No edema.     Comments: Focal tenderness over the anterolateral aspect of the mid shin without erythema, warmth, or appreciable skin changes  Skin:    General: Skin is warm and dry.     Capillary Refill: Capillary refill takes less than 2 seconds.  Neurological:     Mental Status: She is alert and oriented to person, place, and time. Mental status is at baseline.      Family Communication: Discussed with patient and her husband at bedside  Disposition: Status is: Inpatient Remains inpatient appropriate because: Will be undergoing angiography today, pending results  Planned Discharge Destination: Home    Time spent: 45 minutes  Author: Shawn Carattini Al-Sultani, MD 01/20/2024 7:43 PM  For on call review [email or web address].

## 2024-01-20 NOTE — Op Note (Signed)
 Patient name: [name] MRN: [id number] DOB: Feb 23, 1941 Sex: female  01/20/2024 Pre-operative Diagnosis: Chronic limb-threatening ischemia with tissue loss of the left lower extremity Post-operative diagnosis:  Same Surgeon:  Norman GORMAN Serve, MD Procedure Performed:  Ultrasound-guided access of right common femoral artery Aortogram left lower extremity angiogram Intravascular lithotripsy of left popliteal, 5 mm E8 balloon Drug-coated balloon angioplasty of left popliteal, 5 mm x 100 mm Ranger Drug-coated balloon angioplasty of in-stent restenosis of left popliteal, 6 mm x 80 mm Ranger 44 minutes of moderate sedation with fentanyl  Versed    Indications: [name] is an 83 year old female who has undergone multiple vascular interventions in the past and has a left SFA to popliteal stent.  She has a left ankle wound and tenderness in her calf.  Duplex demonstrated a significant stenosis in the left popliteal and therefore risks and benefits of angiogram with intervention were reviewed.  She elected to proceed.  Findings:  Patent aorta and bilateral renal arteries.  Patent iliac systems bilaterally. Left common femoral, profunda and SFA stents are patent.  At the distal stent edge and in the left popliteal artery behind the knee there are tandem lesions that appear to be severe near total occlusion.  There is two-vessel runoff via the peroneal and AT.   Procedure:  The patient was identified in the holding area and taken to the cath lab  The patient was then placed supine on the table and prepped and draped in the usual sterile fashion.  A time out was called.  Ultrasound was used to evaluate the right common femoral artery.  It was patent .  A digital ultrasound image was acquired.  A micropuncture needle was used to access the right common femoral artery under ultrasound guidance.  An 018 wire was advanced without resistance and a micropuncture sheath was placed.  The 018 wire was removed  and a benson wire was placed.  The micropuncture sheath was exchanged for a 5 french sheath.  An omniflush catheter was advanced over the wire to the level of L-1.  An abdominal angiogram was obtained.  Next, using the omniflush catheter and a Bentson wire, the aortic bifurcation was crossed and the catheter was placed into theleft external iliac artery and left runoff was obtained. This demonstrated the above findings.  A glide advantage wire was then placed through the catheter and into the left SFA stents.  The short 5 French sheath was then exchanged for a 6 [place] by 40 cm catapult sheath and the patient was systemically heparinized.  Using the glide advantage wire and a quick cross catheter the popliteal lesions were crossed.  An angiogram via the catheter demonstrated true lumen access distally.  An 014 wire was then placed through the catheter and the lesions were first treated with a 5 mm shockwave balloon for total of 6 pulses throughout the segment.  In the nonstented segment of the behind the knee popliteal a 5 mm x 100 mm Ranger balloon was used and for the in-stent restenosis a 6 mm x 80 mm Ranger balloon was used.  Completion angiogram demonstrated brisk flow through the stents and wide patency of the treated segments with a short segment dissection that was not flow-limiting.  There was preserved runoff via the peroneal which is innominate and diseased AT.  Given the right common femoral disease the patient was transferred to recovery with plan for a sheath pull and manual pressure will Contrast: 60 cc Sedation: 44 minutes  Impression:  Maximally revascularized on the left with inline flow through the SFA/popliteal stents with a dominant peroneal and diseased AT.   Norman GORMAN Serve MD [facility] [place] Office: [phone]

## 2024-01-20 NOTE — Progress Notes (Signed)
 PHARMACY - ANTICOAGULATION CONSULT NOTE  Pharmacy Consult for heparin  Indication: Ischemic limb  Allergies  Allergen Reactions   Iodine  Rash and Other (See Comments)    BETADINE, blistering rash/burns   Iohexol  Rash and Other (See Comments)    Blisters; PT NEEDS 13-HOUR PREP    Petroleum Gauze Non-Woven 3x9 [Wound Dressings] Other (See Comments)    BURNING SENSATION, RED SKIN   Sulfa  Antibiotics Nausea And Vomiting   Wound Dressing Adhesive Other (See Comments)    Burns skin   Tape Itching, Rash and Other (See Comments)    DO NOT USE ADHESIVE TAPE Not even a Band Aid NO PAPER TAPE Burns/tears skin Skin is very very thin    Patient Measurements: Height: 5' 3 (160 cm) Weight: 37.5 kg (82 lb 11.2 oz) IBW/kg (Calculated) : 52.4 HEPARIN  DW (KG): 37.5  Vital Signs: Temp: 97.8 F (36.6 C) (10/13 1854) Temp Source: Oral (10/13 1854) BP: 148/62 (10/13 1854) Pulse Rate: 75 (10/13 1854)  Labs: Recent Labs    01/18/24 1749 01/18/24 1749 01/19/24 0723 01/19/24 1644 01/20/24 0317 01/20/24 1222  HGB 12.1  --  11.3*  --  10.1*  --   HCT 36.7  --  34.3*  --  30.4*  --   PLT 357  --  309  --  297  --   APTT  --    < > 70* 66* 117* 99*  HEPARINUNFRC  --   --  >1.10*  --  >1.10*  --   CREATININE 1.94*  --  1.77*  --   --   --    < > = values in this interval not displayed.    Estimated Creatinine Clearance: 13.8 mL/min (A) (by C-G formula based on SCr of 1.77 mg/dL (H)).   Medical History: Past Medical History:  Diagnosis Date   Anemia 05/2014   Atrial fibrillation (HCC)    x 3 yrs   Breast cancer (HCC)    S/P left mastectomy and chemotherapy 1989 remained in remission   Carotid artery occlusion    Carotid bruit    LICA 60-79% (duplex 9/11)   Degenerative arthritis of spine 2015   Chronic back pain   Hyperlipidemia    Hypertension    Meningioma (HCC)    Osteoporosis    Peripheral arterial disease    Pneumonia 05/19/2014   Stroke [facility])    CVA 2008    Subclavian steal syndrome    Ulcers of both lower extremities (HCC) 2015     Assessment: 63 YOF presenting with ischemia of left leg, on Eliquis  PTA for afib with last dose 10/11 AM  Heparin  gtt therapeutic on 600 units/hr previously  Pt underwent revascularization with VVS on 10/13 PM.   Goal of Therapy:  Heparin  level 0.3-0.7 units/ml aPTT 66-102 seconds Monitor platelets by anticoagulation protocol: Yes   Plan:  Leave heparin  OFF 10/13 PM and restart per vascular at 0700 on 10/14 Start heparin  at 600 units/hr at 0700 Daily aPTT and heparin  level until levels are correlating Daily CBC for s/sx of bleeding  Will f/u restart eliquis    Sharyne Glatter, PharmD, BCCCP Critical Care Clinical Pharmacist 01/20/2024 7:26 PM

## 2024-01-20 NOTE — Plan of Care (Signed)
  Problem: Clinical Measurements: Goal: Ability to maintain clinical measurements within normal limits will improve Outcome: Progressing Goal: Will remain free from infection Outcome: Progressing Goal: Diagnostic test results will improve Outcome: Progressing Goal: Cardiovascular complication will be avoided Outcome: Progressing   Problem: Activity: Goal: Risk for activity intolerance will decrease Outcome: Progressing   Problem: Safety: Goal: Ability to remain free from injury will improve Outcome: Progressing   Problem: Pain Managment: Goal: General experience of comfort will improve and/or be controlled Outcome: Progressing

## 2024-01-21 ENCOUNTER — Encounter (HOSPITAL_COMMUNITY): Payer: Self-pay | Admitting: Vascular Surgery

## 2024-01-21 DIAGNOSIS — Z95820 Peripheral vascular angioplasty status with implants and grafts: Secondary | ICD-10-CM | POA: Diagnosis not present

## 2024-01-21 DIAGNOSIS — T82856A Stenosis of peripheral vascular stent, initial encounter: Secondary | ICD-10-CM | POA: Diagnosis not present

## 2024-01-21 DIAGNOSIS — I7092 Chronic total occlusion of artery of the extremities: Secondary | ICD-10-CM

## 2024-01-21 DIAGNOSIS — I70222 Atherosclerosis of native arteries of extremities with rest pain, left leg: Secondary | ICD-10-CM | POA: Diagnosis not present

## 2024-01-21 DIAGNOSIS — Z48812 Encounter for surgical aftercare following surgery on the circulatory system: Secondary | ICD-10-CM | POA: Diagnosis not present

## 2024-01-21 DIAGNOSIS — I70243 Atherosclerosis of native arteries of left leg with ulceration of ankle: Secondary | ICD-10-CM

## 2024-01-21 DIAGNOSIS — L97329 Non-pressure chronic ulcer of left ankle with unspecified severity: Secondary | ICD-10-CM

## 2024-01-21 LAB — BASIC METABOLIC PANEL WITH GFR
Anion gap: 12 (ref 5–15)
BUN: 44 mg/dL — ABNORMAL HIGH (ref 8–23)
CO2: 23 mmol/L (ref 22–32)
Calcium: 8.3 mg/dL — ABNORMAL LOW (ref 8.9–10.3)
Chloride: 103 mmol/L (ref 98–111)
Creatinine, Ser: 1.86 mg/dL — ABNORMAL HIGH (ref 0.44–1.00)
GFR, Estimated: 26 mL/min — ABNORMAL LOW (ref >60)
Glucose, Bld: 127 mg/dL — ABNORMAL HIGH (ref 70–99)
Potassium: 3.9 mmol/L (ref 3.5–5.1)
Sodium: 138 mmol/L (ref 135–145)

## 2024-01-21 LAB — APTT: aPTT: 72 s — ABNORMAL HIGH (ref 24–36)

## 2024-01-21 LAB — CBC
HCT: 28.5 % — ABNORMAL LOW (ref 36.0–46.0)
Hemoglobin: 9.3 g/dL — ABNORMAL LOW (ref 12.0–15.0)
MCH: 33 pg (ref 26.0–34.0)
MCHC: 32.6 g/dL (ref 30.0–36.0)
MCV: 101.1 fL — ABNORMAL HIGH (ref 80.0–100.0)
Platelets: 244 K/uL (ref 150–400)
RBC: 2.82 MIL/uL — ABNORMAL LOW (ref 3.87–5.11)
RDW: 15.8 % — ABNORMAL HIGH (ref 11.5–15.5)
WBC: 15.2 K/uL — ABNORMAL HIGH (ref 4.0–10.5)
nRBC: 0 % (ref 0.0–0.2)

## 2024-01-21 LAB — LIPID PANEL
Cholesterol: 121 mg/dL (ref 0–200)
HDL: 52 mg/dL (ref >40)
LDL Cholesterol: 61 mg/dL (ref 0–99)
Total CHOL/HDL Ratio: 2.3 ratio
Triglycerides: 39 mg/dL (ref <150)
VLDL: 8 mg/dL (ref 0–40)

## 2024-01-21 MED ORDER — APIXABAN 2.5 MG PO TABS
2.5000 mg | ORAL_TABLET | Freq: Two times a day (BID) | ORAL | Status: DC
  Administered 2024-01-21 – 2024-01-22 (×2): 2.5 mg via ORAL
  Filled 2024-01-21 (×2): qty 1

## 2024-01-21 MED ORDER — METOPROLOL TARTRATE 50 MG PO TABS
75.0000 mg | ORAL_TABLET | Freq: Two times a day (BID) | ORAL | Status: DC
  Administered 2024-01-21 – 2024-01-22 (×2): 75 mg via ORAL
  Filled 2024-01-21 (×2): qty 1

## 2024-01-21 NOTE — Progress Notes (Addendum)
 Progress Note    01/21/2024 6:48 AM 1 Day Post-Op  Subjective:  says her calf still hurts  afebrile  Vitals:   01/20/24 2325 01/21/24 0415  BP: 139/60 (!) 147/66  Pulse: 62 75  Resp: 16 17  Temp: 97.8 F (36.6 C) 97.6 F (36.4 C)  SpO2: 92% 93%    Physical Exam: General:  no distress Cardiac:  regular Lungs:  non labored Incisions:  right groin is soft Extremities:  brisk doppler flow left peroneal; left calf is tender to touch but is very soft.  Her anterior compartment is also soft.    CBC    Component Value Date/Time   WBC 15.2 (H) 01/21/2024 0338   RBC 2.82 (L) 01/21/2024 0338   HGB 9.3 (L) 01/21/2024 0338   HGB 13.3 07/30/2022 0931   HCT 28.5 (L) 01/21/2024 0338   HCT 40.4 07/30/2022 0931   PLT 244 01/21/2024 0338   PLT 289 07/30/2022 0931   MCV 101.1 (H) 01/21/2024 0338   MCV 96 07/30/2022 0931   MCH 33.0 01/21/2024 0338   MCHC 32.6 01/21/2024 0338   RDW 15.8 (H) 01/21/2024 0338   RDW 14.9 07/30/2022 0931   LYMPHSABS 0.8 01/18/2024 1749   LYMPHSABS 1.1 07/30/2022 0931   MONOABS 1.7 (H) 01/18/2024 1749   EOSABS 0.0 01/18/2024 1749   EOSABS 0.2 07/30/2022 0931   BASOSABS 0.1 01/18/2024 1749   BASOSABS 0.1 07/30/2022 0931    BMET    Component Value Date/Time   NA 138 01/21/2024 0338   NA 139 01/06/2024 1034   K 3.9 01/21/2024 0338   CL 103 01/21/2024 0338   CO2 23 01/21/2024 0338   GLUCOSE 127 (H) 01/21/2024 0338   BUN 44 (H) 01/21/2024 0338   BUN 49 (H) 01/06/2024 1034   CREATININE 1.86 (H) 01/21/2024 0338   CREATININE 0.57 (L) 03/14/2015 1230   CALCIUM 8.3 (L) 01/21/2024 0338   GFRNONAA 26 (L) 01/21/2024 0338   GFRNONAA 36 11/16/2022 1454   GFRAA >60 02/20/2015 0418    INR    Component Value Date/Time   INR 1.4 (H) 12/25/2021 1038   INR 3.2 06/14/2010 0840     Intake/Output Summary (Last 24 hours) at 01/21/2024 0648 Last data filed at 01/20/2024 0900 Gross per 24 hour  Intake --  Output 350 ml  Net -350 ml       Assessment/Plan:  83 y.o. female is s/p:  Angiogram with intravascular lithotripsy of left popliteal, DCBA of in stent restenosis of left popliteal via right CFA by Dr. Pearline for CLI with tissue loss of the LLE  1 Day Post-Op   -pt with brisk doppler flow left peroneal.  Right groin is soft. -she does have calf tenderness that she states is what brought her to the hospital and it is somewhat improved this am.  Her calf is very soft as well as the anterior compartment.   -maximally revascularized on the left with inline flow through the SFA/popliteal stents with a dominant peroneal and diseased AT.  She is maximally revascularized with inline flow through SFA/popliteal stents with dominant peroneal and diseased AT.  She can f/u with VVS in 3 months with LLE arterial duplex and ABI.  Please call with questions. -continue plavix [name]   Colleen Apt, PA-C [facility] [phone] 01/21/2024 6:48 AM  I agree with the above.  I have seen and evaluated the patient.  She underwent intervention of her left leg yesterday for high-grade lesion which has been corrected.  She has excellent Doppler signals today.  I do not think that her arterial insufficiency is the etiology of her left leg pain.  She is cleared for discharge from a vascular perspective.  [name]

## 2024-01-21 NOTE — Plan of Care (Signed)
  Problem: Education: Goal: Knowledge of General Education information will improve Description: Including pain rating scale, medication(s)/side effects and non-pharmacologic comfort measures Outcome: Progressing   Problem: Health Behavior/Discharge Planning: Goal: Ability to manage health-related needs will improve Outcome: Progressing   Problem: Clinical Measurements: Goal: Ability to maintain clinical measurements within normal limits will improve Outcome: Progressing   Problem: Clinical Measurements: Goal: Will remain free from infection Outcome: Progressing   Problem: Clinical Measurements: Goal: Diagnostic test results will improve Outcome: Progressing   Problem: Clinical Measurements: Goal: Cardiovascular complication will be avoided Outcome: Progressing   Problem: Nutrition: Goal: Adequate nutrition will be maintained Outcome: Progressing   Problem: Elimination: Goal: Will not experience complications related to bowel motility Outcome: Progressing   Problem: Elimination: Goal: Will not experience complications related to urinary retention Outcome: Progressing   Problem: Pain Managment: Goal: General experience of comfort will improve and/or be controlled Outcome: Progressing   Problem: Safety: Goal: Ability to remain free from injury will improve Outcome: Progressing   Problem: Skin Integrity: Goal: Risk for impaired skin integrity will decrease Outcome: Progressing   Problem: Education: Goal: Understanding of CV disease, CV risk reduction, and recovery process will improve Outcome: Progressing   Problem: Cardiovascular: Goal: Ability to achieve and maintain adequate cardiovascular perfusion will improve Outcome: Progressing   Problem: Cardiovascular: Goal: Vascular access site(s) Level 0-1 will be maintained Outcome: Progressing   Problem: Health Behavior/Discharge Planning: Goal: Ability to safely manage health-related needs after discharge will  improve Outcome: Progressing

## 2024-01-21 NOTE — Progress Notes (Signed)
 Progress Note   Patient: [name] FMW:[id number] DOB: 1940/07/11 DOA: 01/18/2024     3 DOS: the patient was seen and examined on 01/21/2024   Brief hospital course: Patient is an 83 year old female with PMHx of PVD status post multiple angioplasties and stents, breast cancer status postmastectomy, A-fib on Eliquis , CAD, CKD stage IIIb, anemia of chronic disease, HTN, HLD, meningioma, history of necrotizing pneumonia, COPD, gastric ulcer, remote CVA who presented to [facility] ED on 01/18/2024 with complaints of left leg pain and swelling with inability to bear weight since the morning.  She was transferred to [facility] for evaluation by vascular surgery.  Assessment and Plan:  History of peripheral artery disease with extensive angioplasty and stent placement Left lower extremity PAD s/p status post left popliteal artery intravascular lithotripsy and drug-coated balloon angioplasty - Presented emergency department complaining of acute onset pain and swelling of the lateral aspect of her left leg - X-ray of left tib-fib negative for acute abnormalities - Evaluated by vascular surgery Dr. Serene, found to have biphasic left popliteal and peroneal signal and normal neurological exam, indicated no emergent surgical intervention required. Recommended ABIs.  - ABIs showed left SFA/POP stenosis - Underwent left popliteal artery intervention with intravascular lithotripsy and drug-coated balloon angioplasty for in-stent restenosis via femoral access on 01/20/2024 - Heparin  drip discontinued - Home Eliquis  resumed - Continue Plavix  and pravastatin  - Vascular surgery cleared for discharge  Left lower extremity pain - Patient continues to report focal point tenderness and inability to bear weight on her left lower extremity despite her revascularization procedure.  - Based on initial discussions, there was an expectation that given the location and quality of her pain, it's unlikely to  be vascular related. She maintains that she would like to find out the etiology of her pain and if she has a stress fracture prior to being discharged - MRI left tib/fib ordered - PT/OT ordered to evaluate for safe discharge given inability to bear weight  Paroxysmal atrial fibrillation on Eliquis  - Continue amiodarone  - Home metoprolol  75 mg BID resumed given resolution of bradycardia - Heparin  drip discontinued - Home Eliquis  resumed  Acute kidney injury CKD stage IIIb  - Creatinine elevated to 1.94 on admission.  Baseline creatinine around 1.4-1.7.   - Slight elevation in creatinine to 1.86 today, possibly related to contrast exposure or simply variation within new baseline - Recheck on morning BMP   Hypertensive urgency-improved Primary hypertension - Hypertensive on arrival to the ED.  Improved with pain management. - On torsemide  held in the setting of AKI.  Patient euvolemic - Continue IV hydralazine  as needed   Hyperkalemia -- resolved - Elevated potassium 5.4.  Treated with calcium gluconate and Lokelma .    Sinus bradycardia -- resolved - Heart improved to 90s today  History of COPD - Stable   History of CVA - Continue Plavix  and pravastatin    Hypothyroidism - Continue levothyroxine        Subjective: Patient seen at bedside today.  The patient reports doing well after her revascularization yesterday.  She reports some improvement in left lower extremity pain.  However, she maintains that although vascular surgery cleared her for discharge, she would like to find out the cause of her left lower extremity pain prior to discharge.  We had previously discussed that given the location, acuity, and quality of tenderness of her left leg, the cause of the pain was unlikely to be simply vascular in nature.  She would like  to find out if there is a stress hairline fracture.  She has not attempted to bear weight on the leg since arriving to the hospital.  Physical  Exam: Vitals:   01/21/24 0854 01/21/24 1206 01/21/24 1701 01/21/24 2000  BP: (!) 140/60 127/69 (!) 152/74 (!) 155/82  Pulse: 98 93 93 71  Resp: 18 16 17    Temp: 98.5 F (36.9 C) 98.1 F (36.7 C) 97.8 F (36.6 C) 97.9 F (36.6 C)  TempSrc: Oral Oral Oral Oral  SpO2: 94% 99% 95% 97%  Weight:      Height:       Physical Exam Constitutional:      General: She is not in acute distress.    Appearance: She is underweight. She is not ill-appearing.  HENT:     Mouth/Throat:     Mouth: Mucous membranes are moist.  Eyes:     Pupils: Pupils are equal, round, and reactive to light.  Cardiovascular:     Rate and Rhythm: Normal rate and regular rhythm.     Heart sounds: Normal heart sounds. No murmur heard. Pulmonary:     Effort: Pulmonary effort is normal. No respiratory distress.     Breath sounds: Normal breath sounds. No wheezing.  Abdominal:     General: Bowel sounds are normal. There is no distension.     Palpations: Abdomen is soft.     Tenderness: There is no abdominal tenderness. There is no guarding.  Genitourinary:    Comments: Right groin access site soft without appreciable hematoma or swelling Musculoskeletal:     Right lower leg: No edema.     Left lower leg: No edema.     Comments: Focal tenderness over the anterolateral aspect of the mid shin without erythema, warmth, or appreciable skin changes  Skin:    General: Skin is warm and dry.     Capillary Refill: Capillary refill takes less than 2 seconds.  Neurological:     Mental Status: She is alert and oriented to person, place, and time. Mental status is at baseline.      Family Communication: Discussed with patient  Disposition: Status is: Inpatient Remains inpatient appropriate because: Pending MRI of the tib-fib and PT OT evaluation to ensure safe patient discharge  Planned Discharge Destination: Home    DVT Prophylaxis: apixaban  (ELIQUIS ) tablet 2.5 mg Start: 01/21/24 2330 SCDs Start: 01/18/24  2242 Place TED hose Start: 01/18/24 2242 apixaban  (ELIQUIS ) tablet 2.5 mg    Time spent: 45 minutes  Author: Maxemiliano Riel Al-Sultani, MD 01/21/2024 10:37 PM  For on call review [email or web address].

## 2024-01-21 NOTE — Progress Notes (Signed)
 PHARMACY - ANTICOAGULATION CONSULT NOTE  Pharmacy Consult for heparin  Indication: Ischemic limb  Allergies  Allergen Reactions   Iodine  Rash and Other (See Comments)    BETADINE, blistering rash/burns   Iohexol  Rash and Other (See Comments)    Blisters; PT NEEDS 13-HOUR PREP    Petroleum Gauze Non-Woven 3x9 [Wound Dressings] Other (See Comments)    BURNING SENSATION, RED SKIN   Sulfa  Antibiotics Nausea And Vomiting   Wound Dressing Adhesive Other (See Comments)    Burns skin   Tape Itching, Rash and Other (See Comments)    DO NOT USE ADHESIVE TAPE Not even a Band Aid NO PAPER TAPE Burns/tears skin Skin is very very thin    Patient Measurements: Height: 5' 3 (160 cm) Weight: 37.5 kg (82 lb 11.2 oz) IBW/kg (Calculated) : 52.4 HEPARIN  DW (KG): 37.5  Vital Signs: Temp: 98.1 F (36.7 C) (10/14 1206) Temp Source: Oral (10/14 1206) BP: 127/69 (10/14 1206) Pulse Rate: 93 (10/14 1206)  Labs: Recent Labs    01/18/24 1749 01/19/24 0723 01/19/24 1644 01/20/24 0317 01/20/24 1222 01/21/24 0338 01/21/24 1547  HGB 12.1 11.3*  --  10.1*  --  9.3*  --   HCT 36.7 34.3*  --  30.4*  --  28.5*  --   PLT 357 309  --  297  --  244  --   APTT  --  70*   < > 117* 99*  --  72*  HEPARINUNFRC  --  >1.10*  --  >1.10*  --   --   --   CREATININE 1.94* 1.77*  --   --   --  1.86*  --    < > = values in this interval not displayed.    Estimated Creatinine Clearance: 13.1 mL/min (A) (by C-G formula based on SCr of 1.86 mg/dL (H)).   Assessment: 29 YOF presenting with ischemia of left leg, on Eliquis  PTA for afib with last dose 10/11 AM  Pt underwent revascularization with VVS on 10/13 PM.   aPTT 72 sec (therapeutic) on heparin  infusion at 600 units/hr.  Goal of Therapy:  Heparin  level 0.3-0.7 units/ml aPTT 66-102 seconds Monitor platelets by anticoagulation protocol: Yes   Plan:  Continue heparin  600 units/hr Will f/u daily aPTT, heparin  level, and CBC Will f/u restart  eliquis    Vito Ralph, PharmD, BCPS Please see amion for complete clinical pharmacist phone list 01/21/2024 4:40 PM

## 2024-01-21 NOTE — Progress Notes (Signed)
 Mobility Specialist Progress Note;   01/21/24 0945  Mobility  Activity Pivoted/transferred to/from [facility]  Level of Assistance Minimal assist, patient does 75% or more  Assistive Device Other (Comment) (HHA)  Distance Ambulated (ft) 3 ft  Activity Response Tolerated fair  Mobility Referral Yes  Mobility visit 1 Mobility  Mobility Specialist Start Time (ACUTE ONLY) 0945  Mobility Specialist Stop Time (ACUTE ONLY) [id number]  Mobility Specialist Time Calculation (min) (ACUTE ONLY) 7 min   Answered pts call light requesting asisstance from [facility]. Required MinA via HHA to assist pt back to bed. States calf pain is feeling a bit better this AM. VSS on 2LO2. Pt left in bed with all needs met, alarm on.   Lauraine Erm Mobility Specialist Please contact via SecureChat or [employer] [phone]

## 2024-01-21 NOTE — TOC Initial Note (Signed)
 Transition of Care [facility]) - Initial/Assessment Note    Patient Details  Name: [name] MRN: [id number] Date of Birth: 1940/10/22  Transition of Care [facility]) CM/SW Contact:    Sudie Erminio Deems, RN Phone Number: 01/21/2024, 2:12 PM  Clinical Narrative:  Patient presented for leg pain-post lower extremity angiography. PTA patient was from home with spouse. Spouse was at the bedside during the visit. Patient has DME: shower chair, oxygen 2 L via [employer], and transport chair. Patient is currently active with [facility] for RN services. Patient will need resumption orders and F2F once stable for discharge. Inpatient Case Manager will continue to follow for additional disposition needs.                 Expected Discharge Plan: Home w Home Health Services Barriers to Discharge: No Barriers Identified   Patient Goals and CMS Choice Patient states their goals for this hospitalization and ongoing recovery are:: plans to return home with home health   Choice offered to / list presented to : Patient      Expected Discharge Plan and Services In-house Referral: NA Discharge Planning Services: CM Consult Post Acute Care Choice: Home Health, Resumption of Svcs/PTA Provider Living arrangements for the past 2 months: Single Family Home                   DME Agency: NA       HH Arranged: RN, Disease Management HH Agency:  [employer] Home Health) Date [facility] Agency Contacted: 01/21/24 Time HH Agency Contacted: 1412 Representative spoke with at [facility] Agency: angela  Prior Living Arrangements/Services Living arrangements for the past 2 months: Single Family Home Lives with:: Spouse Patient language and need for interpreter reviewed:: Yes Do you feel safe going back to the place where you live?: Yes      Need for Family Participation in Patient Care: Yes (Comment) Care giver support system in place?: Yes (comment) Current home services: DME (shower chair, oxygen via [employer],  transport chair.) Criminal Activity/Legal Involvement Pertinent to Current Situation/Hospitalization: No - Comment as needed  Activities of Daily Living   ADL Screening (condition at time of admission) Independently performs ADLs?: Yes (appropriate for developmental age) Is the patient deaf or have difficulty hearing?: No Does the patient have difficulty seeing, even when wearing glasses/contacts?: No Does the patient have difficulty concentrating, remembering, or making decisions?: No  Permission Sought/Granted Permission sought to share information with : Family Supports, Magazine features editor, Case Estate manager/land agent granted to share information with : Yes, Verbal Permission Granted     Permission granted to share info w AGENCY: [facility]        Emotional Assessment Appearance:: Appears stated age Attitude/Demeanor/Rapport: Engaged Affect (typically observed): Appropriate Orientation: : Oriented to Self, Oriented to Place, Oriented to  Time, Oriented to Situation Alcohol / Substance Use: Not Applicable Psych Involvement: No (comment)  Admission diagnosis:  Right leg pain [M79.604] Peripheral artery disease [I73.9] Patient Active Problem List   Diagnosis Date Noted   Hypertensive urgency 01/18/2024   Sinus bradycardia 01/18/2024   History of breast cancer 01/18/2024   Paroxysmal atrial fibrillation (HCC) 01/18/2024   History of COPD 01/18/2024   History of CVA (cerebrovascular accident) 01/18/2024   Hypothyroidism 01/18/2024   Peripheral artery disease 01/18/2024   Acute exacerbation of CHF (congestive heart failure) (HCC) 07/25/2023   Lobar pneumonia 07/25/2023   Wound infection 03/18/2023   Cellulitis of right lower extremity 03/14/2023   Skin ulcer of  right ankle with fat layer exposed (HCC) 03/14/2023   Sepsis (HCC) 03/13/2023   CHF, acute on chronic (HCC) 11/17/2022   CKD stage 3b, GFR 30-44 ml/min (HCC) 11/17/2022   D-dimer, elevated  11/17/2022   Acute respiratory failure with hypoxia (HCC) 06/18/2022   Aspiration pneumonitis (HCC) 06/18/2022   Acute on chronic HFrEF (heart failure with reduced ejection fraction) (HCC) 06/18/2022   Hypercoagulable state due to persistent atrial fibrillation (HCC) 06/11/2022   Acute heart failure with preserved ejection fraction (HFpEF) (HCC) 05/24/2022   Cellulitis and abscess of toe of left foot 03/07/2022   GERD (gastroesophageal reflux disease) 03/06/2022   Atypical atrial flutter (HCC)    Sepsis due to undetermined organism (HCC) 12/25/2021   Diarrhea 12/25/2021   Hyperkalemia 12/25/2021   Osteomyelitis of great toe of left foot (HCC) 12/25/2021   PAD (peripheral artery disease) with history of multiple angioplasty and stent placement 12/19/2021   Osteoporosis    Tibial plateau fracture, left approx. 05/10/17 06/27/2017   COPD, moderate (HCC) 07/12/2015   Multiple gastric ulcers    Coagulopathy    Bleeding gastrointestinal    Acute GI bleeding    CAD in native artery    Supratherapeutic INR    Right lower lobe lung mass    Cavitary lesion of lung    Acute kidney injury superimposed on chronic kidney disease    Chronic back pain    GI bleed 02/14/2015   Carotid artery disease 02/14/2015   Leukocytosis 02/14/2015   Lung nodules 09/29/2014   Smoking history 09/29/2014   Pneumonia with cavity of lung 07/09/2014   Lung mass 07/09/2014   Thrombocytosis (HCC) 05/21/2014   Protein-calorie malnutrition, moderate 05/21/2014   Anemia    Generalized weakness    Necrotic pneumonia (HCC) 05/20/2014   Pneumonia 05/20/2014   Elevated liver enzymes 12/16/2013   Tremor 06/24/2013   Encounter for therapeutic drug monitoring 05/18/2013   Dizziness 01/12/2013   Wound of ankle 10/23/2010   Long term current use of anticoagulant 07/05/2010   Carotid bruit 11/01/2009   Hyperlipidemia 10/26/2009   Essential hypertension 10/26/2009   Persistent atrial fibrillation (HCC) 10/26/2009    ACUTE ON CHRONIC DIASTOLIC HEART FAILURE 10/26/2009   SYNCOPE AND COLLAPSE 10/26/2009   PCP:  [name], MD Pharmacy:   [facility] - [place], [place] - [address] [facility] AT Portal to Registered [address] [place] [address] Phone: [phone] Fax: [phone]  [facility] - [place], [place] - [address] [place] [address] Phone: [phone] Fax: [phone]     Social Drivers of Health (SDOH) Social History: SDOH Screenings   Food Insecurity: No Food Insecurity (01/19/2024)  Housing: Low Risk  (01/19/2024)  Transportation Needs: No Transportation Needs (01/19/2024)  Utilities: Not At Risk (01/19/2024)  Depression (PHQ2-9): Low Risk  (12/13/2023)  Social Connections: Unknown (01/19/2024)  Tobacco Use: Medium Risk (01/18/2024)   SDOH Interventions:     Readmission Risk Interventions    07/25/2023    3:58 PM 06/18/2022    1:55 PM 03/07/2022    8:09 AM  Readmission Risk Prevention Plan  Transportation Screening Complete Complete Complete  PCP or Specialist Appt within 5-7 Days Complete    Home Care Screening Complete    Medication Review (RN CM) Complete    HRI or Home Care Consult  Complete Complete  Social Work Consult for Recovery Care Planning/Counseling  Complete Complete  Palliative Care Screening  Not Applicable Not Applicable  Medication Review Oceanographer)  Complete Complete

## 2024-01-22 ENCOUNTER — Inpatient Hospital Stay (HOSPITAL_COMMUNITY)

## 2024-01-22 DIAGNOSIS — I739 Peripheral vascular disease, unspecified: Secondary | ICD-10-CM | POA: Diagnosis not present

## 2024-01-22 LAB — CBC
HCT: 28.9 % — ABNORMAL LOW (ref 36.0–46.0)
Hemoglobin: 9.3 g/dL — ABNORMAL LOW (ref 12.0–15.0)
MCH: 32.6 pg (ref 26.0–34.0)
MCHC: 32.2 g/dL (ref 30.0–36.0)
MCV: 101.4 fL — ABNORMAL HIGH (ref 80.0–100.0)
Platelets: 292 K/uL (ref 150–400)
RBC: 2.85 MIL/uL — ABNORMAL LOW (ref 3.87–5.11)
RDW: 15.9 % — ABNORMAL HIGH (ref 11.5–15.5)
WBC: 15.5 K/uL — ABNORMAL HIGH (ref 4.0–10.5)
nRBC: 0 % (ref 0.0–0.2)

## 2024-01-22 LAB — BASIC METABOLIC PANEL WITH GFR
Anion gap: 7 (ref 5–15)
BUN: 40 mg/dL — ABNORMAL HIGH (ref 8–23)
CO2: 22 mmol/L (ref 22–32)
Calcium: 7.8 mg/dL — ABNORMAL LOW (ref 8.9–10.3)
Chloride: 110 mmol/L (ref 98–111)
Creatinine, Ser: 1.67 mg/dL — ABNORMAL HIGH (ref 0.44–1.00)
GFR, Estimated: 30 mL/min — ABNORMAL LOW (ref >60)
Glucose, Bld: 96 mg/dL (ref 70–99)
Potassium: 3.9 mmol/L (ref 3.5–5.1)
Sodium: 139 mmol/L (ref 135–145)

## 2024-01-22 LAB — POCT ACTIVATED CLOTTING TIME: Activated Clotting Time: 153 s

## 2024-01-22 MED ORDER — DOCUSATE SODIUM 100 MG PO CAPS: 100.0000 mg | ORAL_CAPSULE | Freq: Two times a day (BID) | ORAL | 0 refills | Status: AC

## 2024-01-22 NOTE — TOC Transition Note (Signed)
 Transition of Care [facility]) - Discharge Note   Patient Details  Name: [name] MRN: [id number] Date of Birth: [birth year]  [facility] of [facility] CM/SW Contact:  Sudie Erminio Deems, RN Phone Number: 01/22/2024, 2:57 PM   Clinical Narrative: Sherrod is aware that the patient will discharge today. No further needs identified at this time.       Final next level of care: Home w Home Health Services Barriers to Discharge: No Barriers Identified   Patient Goals and CMS Choice Patient states their goals for this hospitalization and ongoing recovery are:: plans to return home with home health   Choice offered to / list presented to : Patient      Discharge Placement                       Discharge Plan and Services Additional resources added to the After Visit Summary for   In-house Referral: NA Discharge Planning Services: CM Consult Post Acute Care Choice: Home Health, Resumption of Svcs/PTA Provider            DME Agency: NA       HH Arranged: RN, Disease Management HH Agency:  [employer] television[employer]film[employer]video Home Health) Date [facility] Agency Contacted: 01/21/24 Time HH Agency Contacted: 1412 Representative spoke with at [facility] Agency: angela  Social Drivers of Health (SDOH) Interventions SDOH Screenings   Food Insecurity: No Food Insecurity (01/19/2024)  Housing: Low Risk  (01/19/2024)  Transportation Needs: No Transportation Needs (01/19/2024)  Utilities: Not At Risk (01/19/2024)  Depression (PHQ2-9): Low Risk  (12/13/2023)  Social Connections: Unknown (01/19/2024)  Tobacco Use: Medium Risk (01/18/2024)     Readmission Risk Interventions    07/25/2023    3:58 PM 06/18/2022    1:55 PM 03/07/2022    8:09 AM  Readmission Risk Prevention Plan  Transportation Screening Complete Complete Complete  PCP or Specialist Appt within 5-7 Days Complete    Home Care Screening Complete    Medication Review (RN CM) Complete    HRI or Home Care Consult  Complete Complete  Social Work  Consult for Recovery Care Planning/Counseling  Complete Complete  Palliative Care Screening  Not Applicable Not Applicable  Medication Review Oceanographer)  Complete Complete

## 2024-01-22 NOTE — Progress Notes (Signed)
 PHARMACIST LIPID MONITORING   [name] is a 83 y.o. female admitted on 01/18/2024 with PVD.  Pharmacy has been consulted to optimize lipid-lowering therapy with the indication of secondary prevention for clinical ASCVD.  Recent Labs:  Lipid Panel (last 6 months):   Lab Results  Component Value Date   CHOL 121 01/21/2024   TRIG 39 01/21/2024   HDL 52 01/21/2024   CHOLHDL 2.3 01/21/2024   VLDL 8 01/21/2024   LDLCALC 61 01/21/2024    Hepatic function panel (last 6 months):   Lab Results  Component Value Date   AST 23 01/19/2024   ALT 16 01/19/2024   ALKPHOS 65 01/19/2024   BILITOT 0.7 01/19/2024    SCr (since admission):   Serum creatinine: 1.67 mg/dL (H) 89/84/74 9590 Estimated creatinine clearance: 14.6 mL/min (A)  Current therapy and lipid therapy tolerance Current lipid-lowering therapy: pravastatin  Previous lipid-lowering therapies (if applicable): n/a Documented or reported allergies or intolerances to lipid-lowering therapies (if applicable): n/a  Assessment:   Patient prefers no changes in lipid-lowering therapy at this time due to preference  Plan:    1.Statin intensity (high intensity recommended for all patients regardless of the LDL):  no statin changes  2.Add ezetimibe (if any one of the following):   Not indicated at this time.  3.Refer to lipid clinic:   No  4.Follow-up with:  Primary care provider - Colleen Norleen PEDLAR, MD  5.Follow-up labs after discharge:  No changes in lipid therapy, repeat a lipid panel in one year.       [name], PharmD 01/22/2024, 10:16 AM

## 2024-01-22 NOTE — Evaluation (Signed)
 Physical Therapy Evaluation Patient Details Name: [name] MRN: [id number] DOB: 04/21/1940 Today's Date: 01/22/2024  History of Present Illness  83 yo female presenting 10/11 with LLE pain. Admitted for further work up of PAD, found to be hypertensive and with AKI. S/p aortogram with LLE angiogram and angioplasty of L popliteal artery 10/13. PMH includes: PAD s/p L femoral artery stent 12/2021, L great toe amputation 12/2021, multiple laser atherectomies in LLE and RLE, and I&D of R ankle ulcer 03/2023, CVA in 2008, HLD, HTN, persistent afib on Eliquis , breast cancer s/p mastectomy.   Clinical Impression  Pt in bed upon arrival of PT, agreeable to evaluation at this time. Prior to admission the pt was mobilizing with use of a rollator or pushing a transport chair in the house, limited to short distances within the home. The pt presents today with deficits in LE strength, endurance, power, and stability, but was largely able to complete bed mobility, transfers and gait with CGA initially. She needed increased assist with gait when fatigued, up to modA to maintain balance and manage RW. The pt reports this is longer than she needs to walk at home, but will benefit from HHPT to progress strength and endurance towards her baseline mobility.  Will continue to follow acutely, but pt has appropriate family support to return home once medically stable.       If plan is discharge home, recommend the following: A little help with walking and/or transfers;A little help with bathing/dressing/bathroom;Direct supervision/assist for medications management;Direct supervision/assist for financial management;Assist for transportation;Help with stairs or ramp for entrance   Can travel by private vehicle        Equipment Recommendations None recommended by PT  Recommendations for Other Services       Functional Status Assessment Patient has had a recent decline in their functional status and demonstrates the  ability to make significant improvements in function in a reasonable and predictable amount of time.     Precautions / Restrictions Precautions Precautions: Fall Recall of Precautions/Restrictions: Intact Restrictions Weight Bearing Restrictions Per Provider Order: No      Mobility  Bed Mobility Overal bed mobility: Needs Assistance Bed Mobility: Rolling, Supine to Sit Rolling: Supervision   Supine to sit: Supervision, HOB elevated, Used rails     General bed mobility comments: exiting on L side    Transfers Overall transfer level: Needs assistance Equipment used: Rolling walker (2 wheels) Transfers: Sit to/from Stand, Bed to chair/wheelchair/BSC Sit to Stand: Contact guard assist   Step pivot transfers: Contact guard assist, Min assist       General transfer comment: CGA to rise with cues for hand placement on RW, when pt fatigued she becomes flustered and then needed min-modA to manage pivotal steps    Ambulation/Gait Ambulation/Gait assistance: Contact guard assist, Min assist, Mod assist Gait Distance (Feet): 45 Feet Assistive device: Rolling walker (2 wheels) Gait Pattern/deviations: Step-to pattern, Decreased stride length, Trunk flexed Gait velocity: decreased Gait velocity interpretation: <1.31 ft/sec, indicative of household ambulator   General Gait Details: pt with significant trunk flexion despite repeated cues. increased assist with fatigue up to modA     Balance Overall balance assessment: Needs assistance Sitting-balance support: No upper extremity supported, Feet supported Sitting balance-Leahy Scale: Fair     Standing balance support: Bilateral upper extremity supported, During functional activity, Reliant on assistive device for balance Standing balance-Leahy Scale: Poor Standing balance comment: cues for position in the RW for safety  Pertinent Vitals/Pain Pain Assessment Pain Assessment: No/denies  pain    Home Living Family/patient expects to be discharged to:: Private residence Living Arrangements: Spouse/significant other Available Help at Discharge: Family;Available 24 hours/day Type of Home: House Home Access: Stairs to enter Entrance Stairs-Rails: Doctor, general practice of Steps: 4   Home Layout: One level Home Equipment: Transport chair;Grab bars - tub/shower;Rollator (4 wheels);Shower seat;Toilet riser;Adaptive equipment;Shower seat - built in;Grab bars - toilet;Hand held shower head Additional Comments: doorways in the house are narrow so some doorways does not allow the rollator but the transport chair does fit    Prior Function Prior Level of Function : Needs assist       Physical Assist : ADLs (physical)     Mobility Comments: ambulates pushing transport chair prior to admission; short household distances ADLs Comments: spouse does adls and helps with light meal prep     Extremity/Trunk Assessment   Upper Extremity Assessment Upper Extremity Assessment: Defer to OT evaluation    Lower Extremity Assessment Lower Extremity Assessment: Defer to PT evaluation LLE Deficits / Details: wound noted with bed mobility RN aware and dressing applied to site    Cervical / Trunk Assessment Cervical / Trunk Assessment: Kyphotic  Communication   Communication Communication: No apparent difficulties    Cognition Arousal: Alert Behavior During Therapy: WFL for tasks assessed/performed   PT - Cognitive impairments: Problem solving, Safety/Judgement, Initiation                       PT - Cognition Comments: pt needing increased cues for safety, especially with fatigue Following commands: Intact       Cueing Cueing Techniques: Verbal cues     General Comments General comments (skin integrity, edema, etc.): VSS on RA    Exercises     Assessment/Plan    PT Assessment Patient needs continued PT services  PT Problem List Decreased  strength;Decreased activity tolerance;Decreased balance;Decreased mobility       PT Treatment Interventions DME instruction;Gait training;Stair training;Functional mobility training;Therapeutic activities;Therapeutic exercise;Balance training;Patient/family education    PT Goals (Current goals can be found in the Care Plan section)  Acute Rehab PT Goals Patient Stated Goal: return home PT Goal Formulation: With patient Time For Goal Achievement: 02/05/24 Potential to Achieve Goals: Good    Frequency Min 2X/week        AM-PAC PT 6 Clicks Mobility  Outcome Measure Help needed turning from your back to your side while in a flat bed without using bedrails?: A Little Help needed moving from lying on your back to sitting on the side of a flat bed without using bedrails?: A Little Help needed moving to and from a bed to a chair (including a wheelchair)?: A Little Help needed standing up from a chair using your arms (e.g., wheelchair or bedside chair)?: A Little Help needed to walk in hospital room?: A Little Help needed climbing 3-5 steps with a railing? : A Lot 6 Click Score: 17    End of Session Equipment Utilized During Treatment: Gait belt Activity Tolerance: Patient tolerated treatment well Patient left: in chair;with call bell/phone within reach;with chair alarm set Nurse Communication: Mobility status PT Visit Diagnosis: Unsteadiness on feet (R26.81);Other abnormalities of gait and mobility (R26.89);Muscle weakness (generalized) (M62.81);Pain    Time: 8679-8650 PT Time Calculation (min) (ACUTE ONLY): 29 min   Charges:   PT Evaluation $PT Eval Moderate Complexity: 1 Mod   PT General Charges $$ ACUTE PT VISIT: 1 Visit  Izetta Call, PT, DPT   Acute Rehabilitation Department Office [phone] Secure Chat Communication Preferred  Izetta JULIANNA Call 01/22/2024, 3:06 PM

## 2024-01-22 NOTE — Plan of Care (Signed)
  Problem: Education: Goal: Knowledge of General Education information will improve Description: Including pain rating scale, medication(s)/side effects and non-pharmacologic comfort measures Outcome: Adequate for Discharge   Problem: Health Behavior/Discharge Planning: Goal: Ability to manage health-related needs will improve Outcome: Adequate for Discharge   Problem: Clinical Measurements: Goal: Ability to maintain clinical measurements within normal limits will improve Outcome: Adequate for Discharge Goal: Will remain free from infection Outcome: Adequate for Discharge Goal: Diagnostic test results will improve Outcome: Adequate for Discharge Goal: Cardiovascular complication will be avoided Outcome: Adequate for Discharge   Problem: Activity: Goal: Risk for activity intolerance will decrease Outcome: Adequate for Discharge   Problem: Nutrition: Goal: Adequate nutrition will be maintained Outcome: Adequate for Discharge   Problem: Elimination: Goal: Will not experience complications related to bowel motility Outcome: Adequate for Discharge Goal: Will not experience complications related to urinary retention Outcome: Adequate for Discharge   Problem: Pain Managment: Goal: General experience of comfort will improve and/or be controlled Outcome: Adequate for Discharge   Problem: Safety: Goal: Ability to remain free from injury will improve Outcome: Adequate for Discharge   Problem: Skin Integrity: Goal: Risk for impaired skin integrity will decrease Outcome: Adequate for Discharge   Problem: Education: Goal: Understanding of CV disease, CV risk reduction, and recovery process will improve Outcome: Adequate for Discharge Goal: Individualized Educational Video(s) Outcome: Adequate for Discharge   Problem: Activity: Goal: Ability to return to baseline activity level will improve Outcome: Adequate for Discharge   Problem: Cardiovascular: Goal: Ability to achieve  and maintain adequate cardiovascular perfusion will improve Outcome: Adequate for Discharge Goal: Vascular access site(s) Level 0-1 will be maintained Outcome: Adequate for Discharge   Problem: Health Behavior/Discharge Planning: Goal: Ability to safely manage health-related needs after discharge will improve Outcome: Adequate for Discharge

## 2024-01-22 NOTE — Evaluation (Signed)
 Occupational Therapy Evaluation Patient Details Name: [name] MRN: [id number] DOB: 05/17/1940 Today's Date: 01/22/2024   History of Present Illness   83 yo female presenting 10/11 with LLE pain. Admitted for further work up of PAD, found to be hypertensive and with AKI. S/p aortogram with LLE angiogram and angioplasty of L popliteal artery 10/13. PMH includes: PAD s/p L femoral artery stent 12/2021, L great toe amputation 12/2021, multiple laser atherectomies in LLE and RLE, and I&D of R ankle ulcer 03/2023, CVA in 2008, HLD, HTN, persistent afib on Eliquis , breast cancer s/p mastectomy.     Clinical Impressions PT admitted with LLE angioplasty . Pt currently with functional limitiations due to the deficits listed below (see OT problem list). Pt expressed during session interest in learning more about palliative medicine consult. Pt states she is not wanting more surgeries and ready to talk to that program about how they can help her. Pt at this time demonstrates adequate level for home d/c with spouse (A) iadls. Pt will benefit from skilled OT to increase their independence and safety with adls and balance to allow discharge HHOT.      If plan is discharge home, recommend the following:   A little help with walking and/or transfers;A little help with bathing/dressing/bathroom     Functional Status Assessment   Patient has had a recent decline in their functional status and demonstrates the ability to make significant improvements in function in a reasonable and predictable amount of time.     Equipment Recommendations   None recommended by OT     Recommendations for Other Services   PT consult     Precautions/Restrictions   Precautions Precautions: Fall Recall of Precautions/Restrictions: Intact Restrictions Weight Bearing Restrictions Per Provider Order: No     Mobility Bed Mobility Overal bed mobility: Needs Assistance Bed Mobility: Rolling, Supine to  Sit Rolling: Supervision   Supine to sit: Supervision     General bed mobility comments: exiting on L side    Transfers Overall transfer level: Needs assistance Equipment used: Rolling walker (2 wheels) Transfers: Sit to/from Stand, Bed to chair/wheelchair/BSC Sit to Stand: Contact guard assist     Step pivot transfers: Contact guard assist            Balance Overall balance assessment: Needs assistance Sitting-balance support: No upper extremity supported, Feet supported Sitting balance-Leahy Scale: Fair     Standing balance support: Bilateral upper extremity supported, During functional activity, Reliant on assistive device for balance Standing balance-Leahy Scale: Poor Standing balance comment: cues for position in the RW for safety                           ADL either performed or assessed with clinical judgement   ADL Overall ADL's : Needs assistance/impaired Eating/Feeding: Independent   Grooming: Wash/dry hands;Supervision/safety;Sitting   Upper Body Bathing: Supervision/ safety;Sitting   Lower Body Bathing: Supervison/ safety;Sit to/from stand   Upper Body Dressing : Supervision/safety;Sitting       Toilet Transfer: Ambulation;BSC/3in1;Rolling walker (2 wheels);Contact guard assist           Functional mobility during ADLs: Rolling walker (2 wheels);Contact guard assist General ADL Comments: pt CGA (A) with RW and then Min (A) at the end of session attempting to sit prematurely     Vision Baseline Vision/History: 1 Wears glasses Ability to See in Adequate Light: 0 Adequate Patient Visual Report: No change from baseline  Perception         Praxis         Pertinent Vitals/Pain Pain Assessment Pain Assessment: No/denies pain     Extremity/Trunk Assessment Upper Extremity Assessment Upper Extremity Assessment: Overall WFL for tasks assessed   Lower Extremity Assessment Lower Extremity Assessment: Defer to PT  evaluation;LLE deficits/detail LLE Deficits / Details: wound noted with bed mobility RN aware and dressing applied to site   Cervical / Trunk Assessment Cervical / Trunk Assessment: Kyphotic   Communication Communication Communication: No apparent difficulties   Cognition Arousal: Alert Behavior During Therapy: WFL for tasks assessed/performed Cognition: No apparent impairments                               Following commands: Intact       Cueing  General Comments      HR 99 RA 98%   Exercises     Shoulder Instructions      Home Living Family/patient expects to be discharged to:: Private residence Living Arrangements: Spouse/significant other Available Help at Discharge: Family;Available 24 hours/day Type of Home: House Home Access: Stairs to enter [employer] of Steps: 4 Entrance Stairs-Rails: Right;Left Home Layout: One level     Bathroom Shower/Tub: Producer, television/film/video: Handicapped height Bathroom Accessibility: Yes   Home Equipment: Transport chair;Grab bars - tub/shower;Rollator (4 wheels);Shower seat;Toilet riser;Adaptive equipment;Shower seat - built in;Grab bars - toilet;Hand held Clinical biochemist: Educational psychologist Comments: doorways in the house are narrow so some doorways does not allow the rollator but the transport chair does fit      Prior Functioning/Environment Prior Level of Function : Needs assist       Physical Assist : ADLs (physical)     Mobility Comments: ambulates pushing transport chair prior to admission; short household distances ADLs Comments: spouse does adls and helps with light meal prep    OT Problem List: Decreased activity tolerance;Impaired balance (sitting and/or standing);Decreased safety awareness;Decreased knowledge of use of DME or AE;Decreased knowledge of precautions;Cardiopulmonary status limiting activity   OT Treatment/Interventions: Self-care/ADL  training;Therapeutic exercise;Energy conservation;DME and/or AE instruction;Therapeutic activities;Patient/family education;Balance training      OT Goals(Current goals can be found in the care plan section)   Acute Rehab OT Goals Patient Stated Goal: to go home with spouse OT Goal Formulation: With patient Time For Goal Achievement: 02/05/24 Potential to Achieve Goals: Good   OT Frequency:  Min 2X/week    Co-evaluation              AM-PAC OT 6 Clicks Daily Activity     Outcome Measure Help from another person eating meals?: None Help from another person taking care of personal grooming?: None Help from another person toileting, which includes using toliet, bedpan, or urinal?: A Little Help from another person bathing (including washing, rinsing, drying)?: A Little Help from another person to put on and taking off regular upper body clothing?: None Help from another person to put on and taking off regular lower body clothing?: A Little 6 Click Score: 21   End of Session Equipment Utilized During Treatment: Rolling walker (2 wheels);Gait belt Nurse Communication: Mobility status;Precautions  Activity Tolerance: Patient tolerated treatment well Patient left: in chair;with call bell/phone within reach;with chair alarm set;with nursing/sitter in room  OT Visit Diagnosis: Unsteadiness on feet (R26.81)                Time: 8679-8650 OT  Time Calculation (min): 29 min Charges:  OT General Charges $OT Visit: 1 Visit OT Evaluation $OT Eval Moderate Complexity: 1 Mod   Brynn, OTR/L  Acute Rehabilitation Services Office: [phone] .   Ely Molt 01/22/2024, 1:56 PM

## 2024-01-22 NOTE — Discharge Summary (Signed)
 Physician Discharge Summary   Patient: [name] MRN: [id number] DOB: Oct 16, 1940  Admit date:     01/18/2024  Discharge date: 01/22/24  Discharge Physician: Nena Rebel   PCP: [name], MD   Recommendations at discharge:   Continue home medications Follow-up with vascular as outpatient Resume home care  Discharge Diagnoses: Principal Problem:   Peripheral artery disease Active Problems:   PAD (peripheral artery disease) with history of multiple angioplasty and stent placement   Hyperlipidemia   Essential hypertension   Acute kidney injury superimposed on chronic kidney disease   Hyperkalemia   Hypertensive urgency   Sinus bradycardia   History of breast cancer   Paroxysmal atrial fibrillation (HCC)   History of COPD   History of CVA (cerebrovascular accident)   Hypothyroidism  Resolved Problems:   * No resolved hospital problems. *  Hospital Course: Patient is an 83 year old female with PMHx of PVD status post multiple angioplasties and stents, breast cancer status postmastectomy, A-fib on Eliquis , CAD, CKD stage IIIb, anemia of chronic disease, HTN, HLD, meningioma, history of necrotizing pneumonia, COPD, gastric ulcer, remote CVA who presented to [facility] ED on 01/18/2024 with complaints of left leg pain and swelling with inability to bear weight since the morning.  She was transferred to [facility] for evaluation by vascular surgery.  Assessment and Plan: History of peripheral artery disease with extensive angioplasty and stent placement Left lower extremity PAD s/p status post left popliteal artery intravascular lithotripsy and drug-coated balloon angioplasty - Presented emergency department complaining of acute onset pain and swelling of the lateral aspect of her left leg - X-ray of left tib-fib negative for acute abnormalities - Evaluated by vascular surgery Dr. Serene, found to have biphasic left popliteal and peroneal signal and normal  neurological exam, indicated no emergent surgical intervention required. Recommended ABIs.  - ABIs showed left SFA/POP stenosis - Underwent left popliteal artery intervention with intravascular lithotripsy and drug-coated balloon angioplasty for in-stent restenosis via femoral access on 01/20/2024 - Heparin  drip discontinued - Home Eliquis  resumed - Continue Plavix  and pravastatin  - Vascular surgery cleared for discharge   Left lower extremity pain - Patient continues to report focal point tenderness and inability to bear weight on her left lower extremity despite her revascularization procedure.  - Based on initial discussions, there was an expectation that given the location and quality of her pain, it's unlikely to be vascular related. She maintains that she would like to find out the etiology of her pain and if she has a stress fracture prior to being discharged - MRI left tib-fib done which did not show any bone abnormalities.  Has some soft tissue swelling. - PT/OT, recommended home with home care   Paroxysmal atrial fibrillation on Eliquis  - Continue amiodarone  - Home metoprolol  75 mg BID resumed given resolution of bradycardia - Heparin  drip discontinued - Home Eliquis  resumed   Acute kidney injury CKD stage IIIb  - Creatinine elevated to 1.94 on admission.  Baseline creatinine around 1.4-1.7.   - Slight elevation in creatinine to 1.86 today, possibly related to contrast exposure or simply variation within new baseline -Fattening 1.67, close to baseline, need to follow-up as outpatient   Hypertensive urgency-improved Primary hypertension - Hypertensive on arrival to the ED.  Improved with pain management. - On torsemide  held in the setting of AKI.  Patient euvolemic - Resume home medication at discharge   Hyperkalemia -- resolved - Elevated potassium 5.4.  Treated with calcium gluconate and Lokelma .  Sinus bradycardia -- resolved - Heart improved to 90s today   History  of COPD - Stable   History of CVA - Continue Plavix  and pravastatin    Hypothyroidism - Continue levothyroxine       Pain control - [place]  Controlled Substance Reporting System database was reviewed. and patient was instructed, not to drive, operate heavy machinery, perform activities at heights, swimming or participation in water  activities or provide baby-sitting services while on Pain, Sleep and Anxiety Medications; until their outpatient Physician has advised to do so again. Also recommended to not to take more than prescribed Pain, Sleep and Anxiety Medications.  Consultants: Vascular Procedures performed: Left popliteal artery intervention with intravascular lithotripsy and drug coated balloon angioplasty for in-stent restenosis via femoral access on 01/20/2024 Disposition: Home Home care Diet recommendation:  Cardiac diet DISCHARGE MEDICATION: Allergies as of 01/22/2024       Reactions   Iodine  Rash, Other (See Comments)   BETADINE, blistering rash/burns   Iohexol  Rash, Other (See Comments)   Blisters; PT NEEDS 13-HOUR PREP   Petroleum Gauze Non-woven 3x9 [wound Dressings] Other (See Comments)   BURNING SENSATION, RED SKIN   Sulfa  Antibiotics Nausea And Vomiting   Wound Dressing Adhesive Other (See Comments)   Burns skin   Tape Itching, Rash, Other (See Comments)   DO NOT USE ADHESIVE TAPE Not even a Band Aid NO PAPER TAPE Burns/tears skin Skin is very very thin        Medication List     TAKE these medications    acetaminophen  325 MG tablet Commonly known as: TYLENOL  Take 2 tablets (650 mg total) by mouth every 6 (six) hours as needed for mild pain (pain score 1-3) (or Fever >/= 101).   AMBULATORY NON FORMULARY MEDICATION Dispense one Readi Steadi glove   amiodarone  200 MG tablet Commonly known as: PACERONE  Take 200 mg by mouth every morning.   apixaban  2.5 MG Tabs tablet Commonly known as: Eliquis  Take 1 tablet (2.5 mg total) by mouth 2  (two) times daily.   ascorbic acid  500 MG tablet Commonly known as: VITAMIN C  Take 500 mg by mouth in the morning.   CALCIUM PO Take 1,200 mg by mouth 2 (two) times daily. 600 mg each   clopidogrel  75 MG tablet Commonly known as: PLAVIX  Take 1 tablet (75 mg total) by mouth every morning.   denosumab  60 MG/ML Sosy injection Commonly known as: PROLIA  Inject 60 mg into the skin every 6 (six) months.   docusate sodium  100 MG capsule Commonly known as: COLACE Take 1 capsule (100 mg total) by mouth 2 (two) times daily.   gabapentin  100 MG capsule Commonly known as: NEURONTIN  Take 1 capsule (100 mg total) by mouth 3 (three) times daily.   HYDROcodone -acetaminophen  5-325 MG tablet Commonly known as: NORCO/VICODIN Take 1 tablet by mouth every 8 (eight) hours as needed for moderate pain (pain score 4-6) or severe pain (pain score 7-10).   Incruse Ellipta  62.5 MCG/ACT Aepb Generic drug: umeclidinium bromide  Inhale 1 puff into the lungs daily.   IRON PO Take 65 mg by mouth at bedtime.   levothyroxine 75 MCG tablet Commonly known as: SYNTHROID Take 75 mcg by mouth daily before breakfast.   MAGNESIUM  OXIDE PO Take 500 mg by mouth every morning.   metoprolol  tartrate 25 MG tablet Commonly known as: LOPRESSOR  Take 3 tablets (75 mg total) by mouth 2 (two) times daily.   multivitamin with minerals Tabs tablet Take 1 tablet by mouth every morning.  mupirocin  ointment 2 % Commonly known as: BACTROBAN  Apply 1 Application topically daily. What changed: additional instructions   pantoprazole  40 MG tablet Commonly known as: PROTONIX  TAKE 1 TABLET DAILY   potassium chloride  10 MEQ tablet Commonly known as: KLOR-CON  M Take 2 tablets (20 mEq total) by mouth daily. What changed:  how much to take when to take this   pravastatin  40 MG tablet Commonly known as: PRAVACHOL  Take 1 tablet (40 mg total) by mouth daily.   predniSONE 5 MG tablet Commonly known as: DELTASONE Take 5  mg by mouth daily at 6 (six) AM. X5 days for acute back pain   Salonpas 3.04-14-08 % Ptch Generic drug: Camphor-Menthol-Methyl Sal Place 1 patch onto the skin daily as needed (pain.).   torsemide  20 MG tablet Commonly known as: DEMADEX  Take 1 tablet (20 mg total) by mouth 2 (two) times daily. What changed: when to take this        Follow-up Information     [facility] Follow up.   Specialty: Home Health Services Why: Registered Nurse for wound care-office to call with visit times. Contact information: [address] [place] [address] [facility] [place]  [phone]               Discharge Exam: Filed Weights   01/18/24 1619 01/19/24 0635  Weight: 38.6 kg 37.5 kg   Constitutional: Alert, awake, calm, comfortable HEENT: Neck supple Respiratory: clear to auscultation bilaterally, no wheezing, no crackles. Normal respiratory effort. No accessory muscle use.  Cardiovascular: Regular rate and rhythm, no murmurs / rubs / gallops. No extremity edema. 2+ pedal pulses. No carotid bruits.  Abdomen: no tenderness, no masses palpated. No hepatosplenomegaly. Bowel sounds positive.  Musculoskeletal: no clubbing / cyanosis. No joint deformity upper and lower extremities. Good ROM, no contractures. Normal muscle tone. Right groin access site soft without appreciable hematoma or swelling.  Focal tenderness over the anterolateral aspect of the mid shin without erythema, warmth, or appreciable skin changes  Skin: no rashes, lesions, ulcers. No induration Neurologic: CN 2-12 grossly intact. Sensation intact, DTR normal. Strength 5/5 x all 4 extremities.  Psychiatric: Normal judgment and insight. Alert and oriented x 3. Normal mood.    Condition at discharge: good  The results of significant diagnostics from this hospitalization (including imaging, microbiology, ancillary and laboratory) are listed below for reference.   Imaging Studies: MR TIBIA FIBULA LEFT WO  CONTRAST Result Date: 01/22/2024 CLINICAL DATA:  Left lower leg pain EXAM: MRI OF LOWER LEFT EXTREMITY WITHOUT CONTRAST TECHNIQUE: Multiplanar, multisequence MR imaging of the left tibia/fibula was performed. No intravenous contrast was administered. COMPARISON:  Radiographs 01/18/2024 FINDINGS: Bones/Joint/Cartilage No fracture or marrow edema identified. No compelling findings of medial tibial stress syndrome. Ligaments N/A Muscles and Tendons Low-level edema observed in the proximal to mid calf along the lateral soleus, tibialis anterior, proximal tibialis posterior, and to a lesser extent peroneus longus and brevis musculature potentially reflecting muscle strain, denervation edema, or myositis. Likewise there is moderate regional atrophy diffusely in the calf. Soft tissues Subcutaneous edema noted posterolaterally and posteromedially along the calf. This may be incidental but cellulitis is not excluded. IMPRESSION: 1. Low-level edema observed in the proximal to mid calf within along the lateral soleus, tibialis anterior, proximal tibialis posterior, and to a lesser extent peroneus longus and brevis musculature potentially reflecting muscle strain, low-grade denervation edema, or myositis. 2. Moderate regional atrophy in the calf. 3. Subcutaneous edema posterolaterally and posteromedially along the calf. This may  be incidental but cellulitis is not excluded. 4. No fracture or findings of medial tibial stress syndrome. Electronically Signed   By: Ryan Salvage M.D.   On: 01/22/2024 12:02   PERIPHERAL VASCULAR CATHETERIZATION Result Date: 01/20/2024 Images from the original result were not included. Patient name: [name] MRN: [id number] DOB: May 29, 1940 Sex: female 01/20/2024 Pre-operative Diagnosis: Chronic limb-threatening ischemia with tissue loss of the left lower extremity Post-operative diagnosis:  Same Surgeon:  Norman GORMAN Serve, MD Procedure Performed: Ultrasound-guided access of right common  femoral artery Aortogram left lower extremity angiogram Intravascular lithotripsy of left popliteal, 5 mm E8 balloon Drug-coated balloon angioplasty of left popliteal, 5 mm x 100 mm Ranger Drug-coated balloon angioplasty of in-stent restenosis of left popliteal, 6 mm x 80 mm Ranger 44 minutes of moderate sedation with fentanyl  Versed  Indications: Ms. Westerhold is an 83 year old female who has undergone multiple vascular interventions in the past and has a left SFA to popliteal stent.  She has a left ankle wound and tenderness in her calf.  Duplex demonstrated a significant stenosis in the left popliteal and therefore risks and benefits of angiogram with intervention were reviewed.  She elected to proceed. Findings: Patent aorta and bilateral renal arteries.  Patent iliac systems bilaterally. Left common femoral, profunda and SFA stents are patent.  At the distal stent edge and in the left popliteal artery behind the knee there are tandem lesions that appear to be severe near total occlusion.  There is two-vessel runoff via the peroneal and AT.  Procedure:  The patient was identified in the holding area and taken to the cath lab  The patient was then placed supine on the table and prepped and draped in the usual sterile fashion.  A time out was called.  Ultrasound was used to evaluate the right common femoral artery.  It was patent .  A digital ultrasound image was acquired.  A micropuncture needle was used to access the right common femoral artery under ultrasound guidance.  An 018 wire was advanced without resistance and a micropuncture sheath was placed.  The 018 wire was removed and a benson wire was placed.  The micropuncture sheath was exchanged for a 5 french sheath.  An omniflush catheter was advanced over the wire to the level of L-1.  An abdominal angiogram was obtained.  Next, using the omniflush catheter and a Bentson wire, the aortic bifurcation was crossed and the catheter was placed into theleft external  iliac artery and left runoff was obtained. This demonstrated the above findings.  A glide advantage wire was then placed through the catheter and into the left SFA stents.  The short 5 French sheath was then exchanged for a 6 [place] by 40 cm catapult sheath and the patient was systemically heparinized.  Using the glide advantage wire and a quick cross catheter the popliteal lesions were crossed.  An angiogram via the catheter demonstrated true lumen access distally.  An 014 wire was then placed through the catheter and the lesions were first treated with a 5 mm shockwave balloon for total of 6 pulses throughout the segment.  In the nonstented segment of the behind the knee popliteal a 5 mm x 100 mm Ranger balloon was used and for the in-stent restenosis a 6 mm x 80 mm Ranger balloon was used.  Completion angiogram demonstrated brisk flow through the stents and wide patency of the treated segments with a short segment dissection that was not flow-limiting.  There was preserved runoff via  the peroneal which is innominate and diseased AT.  Given the right common femoral disease the patient was transferred to recovery with plan for a sheath pull and manual pressure will Contrast: 60 cc Sedation: 44 minutes Impression: Maximally revascularized on the left with inline flow through the SFA/popliteal stents with a dominant peroneal and diseased AT. Norman GORMAN Serve MD [facility] Office: [phone]  VAS US  LOWER EXTREMITY ARTERIAL DUPLEX Result Date: 01/20/2024 LOWER EXTREMITY ARTERIAL DUPLEX STUDY Patient Name:  [name]  Date of Exam:   01/19/2024 Medical Rec #: [id number]     Accession #:    [id number] Date of Birth: 06-26-1940     Patient Gender: F Patient Age:   32 years Exam Location:  [facility] Procedure:      VAS US  LOWER EXTREMITY ARTERIAL DUPLEX Referring Phys: MICAELA SUNDIL --------------------------------------------------------------------------------   Indications: Acute onset of left leg pain (focal area of mid lateral calf) High Risk Factors: Hypertension, hyperlipidemia, past history of smoking. Atrial                    fibrillation, on Eliquis . Carotid artery disease. History of                    right inominate occlusion. Vascular Intervention: 12/19/2021 Stent, left superficial femoral artery,                        Drug-coated balloon angioplasty, left popliteal artery,                        and Angioplasty, left peroneal artery. 12/19/21 Left great                        toe amputation. 08/28/22 Laser atherectomy with                        angioplasty of left popliteal artery, laser atherectomy                        with angioplasty of left anterior tibial artery 10/16/22                        Laser atherectomy and angioplasty of right anterior                        tibial artery, and intra-arterial injection of                        nitroglycerin  into the anterior tibial artery. 02/26/23                        Laser atherectomy and drug-coated balloon angioplasty,                        right superficial femoral and popliteal artery, and                        angioplasty of the right anterior tibial and peroneal                        arteries. 07/16/2023 Laser atherectomy left superficial  femoral artery and drug-coated balloon angioplasty of the                        left superficial femoral artery.   Current ABI: N/A Limitations: Patient involuntary movement when scanning site of concern Comparison Study: Prior left LEA done 08/22/23 at [facility] indicating patent                   left SFA stent. Performing Technologist: Alberta Lis RVS  Examination Guidelines: A complete evaluation includes B-mode imaging, spectral Doppler, color Doppler, and power Doppler as needed of all accessible portions of each vessel. Bilateral testing is considered an integral part of a complete examination. Limited examinations for reoccurring  indications may be performed as noted.  +----------+--------+-----+---------------+-------------------+--------+ RIGHT     PSV cm/sRatioStenosis       Waveform           Comments +----------+--------+-----+---------------+-------------------+--------+ CFA Prox  256                         biphasic                    +----------+--------+-----+---------------+-------------------+--------+ DFA       244                                                     +----------+--------+-----+---------------+-------------------+--------+ SFA Prox  276                                                     +----------+--------+-----+---------------+-------------------+--------+ SFA Mid   758          75-99% stenosis                            +----------+--------+-----+---------------+-------------------+--------+ SFA Distal82                          monophasic                  +----------+--------+-----+---------------+-------------------+--------+ POP Prox  41                          monophasic                  +----------+--------+-----+---------------+-------------------+--------+ POP Mid   37                          monophasic                  +----------+--------+-----+---------------+-------------------+--------+ POP Distal18                          monophasic                  +----------+--------+-----+---------------+-------------------+--------+ ATA Prox  15                          dampened monophasic         +----------+--------+-----+---------------+-------------------+--------+  ATA Mid   13                          dampened monophasic         +----------+--------+-----+---------------+-------------------+--------+ ATA Distal14                          dampened monophasic         +----------+--------+-----+---------------+-------------------+--------+ PTA Prox  35                          monophasic                   +----------+--------+-----+---------------+-------------------+--------+ PTA Mid   43                          monophasic                  +----------+--------+-----+---------------+-------------------+--------+ PTA Distal28                          monophasic                  +----------+--------+-----+---------------+-------------------+--------+  +----------+-------+-----+--------------+----------+---------------------------+ LEFT      PSV    RatioStenosis      Waveform  Comments                              cm/s                                                            +----------+-------+-----+--------------+----------+---------------------------+ CFA Prox  156                       biphasic                              +----------+-------+-----+--------------+----------+---------------------------+ DFA       146                       monophasic                            +----------+-------+-----+--------------+----------+---------------------------+ POP Prox  465         75-99%                  Distal SFA/proximal                               stenosis                popliteal                   +----------+-------+-----+--------------+----------+---------------------------+ POP Mid   15                        monophasic                            +----------+-------+-----+--------------+----------+---------------------------+  POP Distal14                        monophasic                            +----------+-------+-----+--------------+----------+---------------------------+ ATA Prox  23                        monophasic                            +----------+-------+-----+--------------+----------+---------------------------+ ATA Mid   16                        monophasic                            +----------+-------+-----+--------------+----------+---------------------------+ ATA Distal16                         monophasic                            +----------+-------+-----+--------------+----------+---------------------------+  Left Stent(s): +---------------+--------+--------+-------------------+--------+ SFA            PSV cm/sStenosisWaveform           Comments +---------------+--------+--------+-------------------+--------+ Prox to Stent  92              monophasic                  +---------------+--------+--------+-------------------+--------+ Proximal Stent 57                                          +---------------+--------+--------+-------------------+--------+ Mid Stent      41                                          +---------------+--------+--------+-------------------+--------+ Distal Stent   38                                          +---------------+--------+--------+-------------------+--------+ Distal to Stent13              dampened monophasic         +---------------+--------+--------+-------------------+--------+    Summary: Right: Focal 75-99% stenosis noted in the mid superficial femoral artery. Left: 75-99% stenosis noted at the distal superficial femoral artery/proximal popliteal artery. Patent stent with no evidence of stenosis in the superficial femoral artery.  See table(s) above for measurements and observations. Electronically signed by Gaile New MD on 01/20/2024 at 9:02:19 AM.    Final    DG Tibia/Fibula Left Result Date: 01/18/2024 CLINICAL DATA:  Left lower leg pain for several hours, initial encounter EXAM: LEFT TIBIA AND FIBULA - 2 VIEW COMPARISON:  None Available. FINDINGS: No acute fracture or dislocation is noted. No soft tissue abnormality is seen. Diffuse vascular calcifications are noted. IMPRESSION: No acute abnormality noted. Electronically Signed   By: Oneil Devonshire M.D.   On: 01/18/2024 19:07   DG Foot Complete Left Result Date: 12/31/2023  Please see detailed radiograph report in office note.  DG Ankle 2 Views Right Result  Date: 12/31/2023 Please see detailed radiograph report in office note.   Microbiology: Results for orders placed or performed during the hospital encounter of 07/24/23  Resp panel by RT-PCR (RSV, Flu A&B, Covid) Anterior Nasal Swab     Status: None   Collection Time: 07/25/23  8:11 AM   Specimen: Anterior Nasal Swab  Result Value Ref Range Status   SARS Coronavirus 2 by RT PCR NEGATIVE NEGATIVE Final    Comment: (NOTE) SARS-CoV-2 target nucleic acids are NOT DETECTED.  The SARS-CoV-2 RNA is generally detectable in upper respiratory specimens during the acute phase of infection. The lowest concentration of SARS-CoV-2 viral copies this assay can detect is 138 copies/mL. A negative result does not preclude SARS-Cov-2 infection and should not be used as the sole basis for treatment or other patient management decisions. A negative result may occur with  improper specimen collection/handling, submission of specimen other than nasopharyngeal swab, presence of viral mutation(s) within the areas targeted by this assay, and inadequate number of viral copies(<138 copies/mL). A negative result must be combined with clinical observations, patient history, and epidemiological information. The expected result is Negative.  Fact Sheet for Patients:  BloggerCourse.com  Fact Sheet for Healthcare Providers:  SeriousBroker.it  This test is no t yet approved or cleared by the [place]  FDA and  has been authorized for detection and/or diagnosis of SARS-CoV-2 by FDA under an Emergency Use Authorization (EUA). This EUA will remain  in effect (meaning this test can be used) for the duration of the COVID-19 declaration under Section 564(b)(1) of the Act, 21 U.S.C.section 360bbb-3(b)(1), unless the authorization is terminated  or revoked sooner.       Influenza A by PCR NEGATIVE NEGATIVE Final   Influenza B by PCR NEGATIVE NEGATIVE Final     Comment: (NOTE) The Xpert Xpress SARS-CoV-2/FLU/RSV plus assay is intended as an aid in the diagnosis of influenza from Nasopharyngeal swab specimens and should not be used as a sole basis for treatment. Nasal washings and aspirates are unacceptable for Xpert Xpress SARS-CoV-2/FLU/RSV testing.  Fact Sheet for Patients: BloggerCourse.com  Fact Sheet for Healthcare Providers: SeriousBroker.it  This test is not yet approved or cleared by the [place]  FDA and has been authorized for detection and/or diagnosis of SARS-CoV-2 by FDA under an Emergency Use Authorization (EUA). This EUA will remain in effect (meaning this test can be used) for the duration of the COVID-19 declaration under Section 564(b)(1) of the Act, 21 U.S.C. section 360bbb-3(b)(1), unless the authorization is terminated or revoked.     Resp Syncytial Virus by PCR NEGATIVE NEGATIVE Final    Comment: (NOTE) Fact Sheet for Patients: BloggerCourse.com  Fact Sheet for Healthcare Providers: SeriousBroker.it  This test is not yet approved or cleared by the [place]  FDA and has been authorized for detection and/or diagnosis of SARS-CoV-2 by FDA under an Emergency Use Authorization (EUA). This EUA will remain in effect (meaning this test can be used) for the duration of the COVID-19 declaration under Section 564(b)(1) of the Act, 21 U.S.C. section 360bbb-3(b)(1), unless the authorization is terminated or revoked.  Performed at [facility], [address] [place], [place] [address]   Respiratory (~20 pathogens) panel by PCR     Status: None   Collection Time: 07/25/23  8:11 AM   Specimen: Nasopharyngeal Swab; Respiratory  Result Value Ref Range Status   Adenovirus NOT DETECTED NOT DETECTED Final  Coronavirus 229E NOT DETECTED NOT DETECTED Final    Comment: (NOTE) The Coronavirus on the Respiratory Panel, DOES  NOT test for the novel  Coronavirus (2019 nCoV)    Coronavirus HKU1 NOT DETECTED NOT DETECTED Final   Coronavirus NL63 NOT DETECTED NOT DETECTED Final   Coronavirus OC43 NOT DETECTED NOT DETECTED Final   Metapneumovirus NOT DETECTED NOT DETECTED Final   Rhinovirus / Enterovirus NOT DETECTED NOT DETECTED Final   Influenza A NOT DETECTED NOT DETECTED Final   Influenza B NOT DETECTED NOT DETECTED Final   Parainfluenza Virus 1 NOT DETECTED NOT DETECTED Final   Parainfluenza Virus 2 NOT DETECTED NOT DETECTED Final   Parainfluenza Virus 3 NOT DETECTED NOT DETECTED Final   Parainfluenza Virus 4 NOT DETECTED NOT DETECTED Final   Respiratory Syncytial Virus NOT DETECTED NOT DETECTED Final   Bordetella pertussis NOT DETECTED NOT DETECTED Final   Bordetella Parapertussis NOT DETECTED NOT DETECTED Final   Chlamydophila pneumoniae NOT DETECTED NOT DETECTED Final   Mycoplasma pneumoniae NOT DETECTED NOT DETECTED Final    Comment: Performed at [facility], [address] [place], [place] [address]  MRSA Next Gen by PCR, Nasal     Status: None   Collection Time: 07/26/23 11:23 AM   Specimen: Nasal Mucosa; Nasal Swab  Result Value Ref Range Status   MRSA by PCR Next Gen NOT DETECTED NOT DETECTED Final    Comment: (NOTE) The GeneXpert MRSA Assay (FDA approved for NASAL specimens only), is one component of a comprehensive MRSA colonization surveillance program. It is not intended to diagnose MRSA infection nor to guide or monitor treatment for MRSA infections. Test performance is not FDA approved in patients less than 50 years old. Performed at [facility], [address] [place], [place] [address]    *Note: Due to a large number of results and/or encounters for the requested time period, some results have not been displayed. A complete set of results can be found in Results Review.    Labs: CBC: Recent Labs  Lab 01/18/24 1749 01/19/24 0723 01/20/24 0317 01/21/24 0338 01/22/24 0409   WBC 13.5* 10.8* 11.0* 15.2* 15.5*  NEUTROABS 10.8*  --   --   --   --   HGB 12.1 11.3* 10.1* 9.3* 9.3*  HCT 36.7 34.3* 30.4* 28.5* 28.9*  MCV 99.5 98.3 99.7 101.1* 101.4*  PLT 357 309 297 244 292   Basic Metabolic Panel: Recent Labs  Lab 01/18/24 1749 01/19/24 0723 01/21/24 0338 01/22/24 0409  NA 141 139 138 139  K 5.4* 3.9 3.9 3.9  CL 96* 98 103 110  CO2 32 29 23 22   GLUCOSE 95 101* 127* 96  BUN 51* 42* 44* 40*  CREATININE 1.94* 1.77* 1.86* 1.67*  CALCIUM 11.4* 10.1 8.3* 7.8*   Liver Function Tests: Recent Labs  Lab 01/19/24 0723  AST 23  ALT 16  ALKPHOS 65  BILITOT 0.7  PROT 6.7  ALBUMIN  2.8*   CBG: No results for input(s): GLUCAP in the last 168 hours.  Discharge time spent: greater than 30 minutes.  Signed: Nena Rebel, MD [facility] 01/22/2024

## 2024-01-22 NOTE — Plan of Care (Signed)
  Problem: Education: Goal: Knowledge of General Education information will improve Description: Including pain rating scale, medication(s)/side effects and non-pharmacologic comfort measures Outcome: Progressing   Problem: Health Behavior/Discharge Planning: Goal: Ability to manage health-related needs will improve Outcome: Progressing   Problem: Clinical Measurements: Goal: Ability to maintain clinical measurements within normal limits will improve Outcome: Progressing Goal: Will remain free from infection Outcome: Progressing Goal: Diagnostic test results will improve Outcome: Progressing Goal: Cardiovascular complication will be avoided Outcome: Progressing   Problem: Activity: Goal: Risk for activity intolerance will decrease Outcome: Progressing   Problem: Nutrition: Goal: Adequate nutrition will be maintained Outcome: Progressing   Problem: Elimination: Goal: Will not experience complications related to bowel motility Outcome: Progressing Goal: Will not experience complications related to urinary retention Outcome: Progressing   Problem: Pain Managment: Goal: General experience of comfort will improve and/or be controlled Outcome: Progressing   Problem: Safety: Goal: Ability to remain free from injury will improve Outcome: Progressing   Problem: Skin Integrity: Goal: Risk for impaired skin integrity will decrease Outcome: Progressing   Problem: Education: Goal: Understanding of CV disease, CV risk reduction, and recovery process will improve Outcome: Progressing Goal: Individualized Educational Video(s) Outcome: Progressing   Problem: Activity: Goal: Ability to return to baseline activity level will improve Outcome: Progressing   Problem: Cardiovascular: Goal: Ability to achieve and maintain adequate cardiovascular perfusion will improve Outcome: Progressing Goal: Vascular access site(s) Level 0-1 will be maintained Outcome: Progressing   Problem:  Health Behavior/Discharge Planning: Goal: Ability to safely manage health-related needs after discharge will improve Outcome: Progressing

## 2024-01-28 ENCOUNTER — Encounter (HOSPITAL_COMMUNITY): Payer: Self-pay | Admitting: Internal Medicine

## 2024-01-28 ENCOUNTER — Encounter: Payer: Self-pay | Admitting: Podiatry

## 2024-01-28 ENCOUNTER — Other Ambulatory Visit: Payer: Self-pay

## 2024-01-28 ENCOUNTER — Inpatient Hospital Stay (HOSPITAL_COMMUNITY)
Admission: EM | Admit: 2024-01-28 | Discharge: 2024-02-01 | DRG: 474 | Disposition: A | Discharge status: Home Health | Attending: Internal Medicine | Admitting: Internal Medicine

## 2024-01-28 ENCOUNTER — Ambulatory Visit

## 2024-01-28 ENCOUNTER — Emergency Department (HOSPITAL_COMMUNITY)

## 2024-01-28 ENCOUNTER — Ambulatory Visit (INDEPENDENT_AMBULATORY_CARE_PROVIDER_SITE_OTHER): Admitting: Podiatry

## 2024-01-28 VITALS — BP 167/76 | HR 85 | Temp 94.5°F

## 2024-01-28 DIAGNOSIS — Z820 Family history of epilepsy and other diseases of the nervous system: Secondary | ICD-10-CM

## 2024-01-28 DIAGNOSIS — Z882 Allergy status to sulfonamides status: Secondary | ICD-10-CM

## 2024-01-28 DIAGNOSIS — I5043 Acute on chronic combined systolic (congestive) and diastolic (congestive) heart failure: Secondary | ICD-10-CM | POA: Diagnosis present

## 2024-01-28 DIAGNOSIS — I2510 Atherosclerotic heart disease of native coronary artery without angina pectoris: Secondary | ICD-10-CM | POA: Diagnosis present

## 2024-01-28 DIAGNOSIS — Z79890 Hormone replacement therapy (postmenopausal): Secondary | ICD-10-CM | POA: Diagnosis not present

## 2024-01-28 DIAGNOSIS — L97522 Non-pressure chronic ulcer of other part of left foot with fat layer exposed: Secondary | ICD-10-CM

## 2024-01-28 DIAGNOSIS — J440 Chronic obstructive pulmonary disease with acute lower respiratory infection: Secondary | ICD-10-CM | POA: Diagnosis present

## 2024-01-28 DIAGNOSIS — Z79899 Other long term (current) drug therapy: Secondary | ICD-10-CM

## 2024-01-28 DIAGNOSIS — E785 Hyperlipidemia, unspecified: Secondary | ICD-10-CM | POA: Diagnosis present

## 2024-01-28 DIAGNOSIS — Y95 Nosocomial condition: Secondary | ICD-10-CM | POA: Diagnosis present

## 2024-01-28 DIAGNOSIS — Z9013 Acquired absence of bilateral breasts and nipples: Secondary | ICD-10-CM

## 2024-01-28 DIAGNOSIS — Z1152 Encounter for screening for COVID-19: Secondary | ICD-10-CM | POA: Diagnosis not present

## 2024-01-28 DIAGNOSIS — E039 Hypothyroidism, unspecified: Secondary | ICD-10-CM | POA: Diagnosis present

## 2024-01-28 DIAGNOSIS — I10 Essential (primary) hypertension: Secondary | ICD-10-CM | POA: Diagnosis present

## 2024-01-28 DIAGNOSIS — Z853 Personal history of malignant neoplasm of breast: Secondary | ICD-10-CM

## 2024-01-28 DIAGNOSIS — T501X6A Underdosing of loop [high-ceiling] diuretics, initial encounter: Secondary | ICD-10-CM | POA: Diagnosis present

## 2024-01-28 DIAGNOSIS — D631 Anemia in chronic kidney disease: Secondary | ICD-10-CM | POA: Diagnosis present

## 2024-01-28 DIAGNOSIS — Z9842 Cataract extraction status, left eye: Secondary | ICD-10-CM

## 2024-01-28 DIAGNOSIS — Z87891 Personal history of nicotine dependence: Secondary | ICD-10-CM

## 2024-01-28 DIAGNOSIS — Z9841 Cataract extraction status, right eye: Secondary | ICD-10-CM

## 2024-01-28 DIAGNOSIS — L97312 Non-pressure chronic ulcer of right ankle with fat layer exposed: Secondary | ICD-10-CM

## 2024-01-28 DIAGNOSIS — I739 Peripheral vascular disease, unspecified: Secondary | ICD-10-CM

## 2024-01-28 DIAGNOSIS — Z91041 Radiographic dye allergy status: Secondary | ICD-10-CM

## 2024-01-28 DIAGNOSIS — Z888 Allergy status to other drugs, medicaments and biological substances status: Secondary | ICD-10-CM

## 2024-01-28 DIAGNOSIS — J189 Pneumonia, unspecified organism: Secondary | ICD-10-CM | POA: Diagnosis present

## 2024-01-28 DIAGNOSIS — E875 Hyperkalemia: Secondary | ICD-10-CM | POA: Diagnosis present

## 2024-01-28 DIAGNOSIS — Z83438 Family history of other disorder of lipoprotein metabolism and other lipidemia: Secondary | ICD-10-CM

## 2024-01-28 DIAGNOSIS — I4819 Other persistent atrial fibrillation: Secondary | ICD-10-CM | POA: Diagnosis present

## 2024-01-28 DIAGNOSIS — J441 Chronic obstructive pulmonary disease with (acute) exacerbation: Secondary | ICD-10-CM | POA: Diagnosis present

## 2024-01-28 DIAGNOSIS — N1832 Chronic kidney disease, stage 3b: Secondary | ICD-10-CM | POA: Diagnosis present

## 2024-01-28 DIAGNOSIS — I130 Hypertensive heart and chronic kidney disease with heart failure and stage 1 through stage 4 chronic kidney disease, or unspecified chronic kidney disease: Secondary | ICD-10-CM | POA: Diagnosis present

## 2024-01-28 DIAGNOSIS — Z66 Do not resuscitate: Secondary | ICD-10-CM | POA: Diagnosis present

## 2024-01-28 DIAGNOSIS — M869 Osteomyelitis, unspecified: Secondary | ICD-10-CM | POA: Diagnosis not present

## 2024-01-28 DIAGNOSIS — Z7902 Long term (current) use of antithrombotics/antiplatelets: Secondary | ICD-10-CM

## 2024-01-28 DIAGNOSIS — J439 Emphysema, unspecified: Secondary | ICD-10-CM | POA: Diagnosis present

## 2024-01-28 DIAGNOSIS — I5033 Acute on chronic diastolic (congestive) heart failure: Secondary | ICD-10-CM | POA: Diagnosis present

## 2024-01-28 DIAGNOSIS — L97529 Non-pressure chronic ulcer of other part of left foot with unspecified severity: Secondary | ICD-10-CM | POA: Diagnosis present

## 2024-01-28 DIAGNOSIS — J9621 Acute and chronic respiratory failure with hypoxia: Secondary | ICD-10-CM | POA: Diagnosis present

## 2024-01-28 DIAGNOSIS — Z7901 Long term (current) use of anticoagulants: Secondary | ICD-10-CM | POA: Diagnosis not present

## 2024-01-28 DIAGNOSIS — Z8249 Family history of ischemic heart disease and other diseases of the circulatory system: Secondary | ICD-10-CM | POA: Diagnosis not present

## 2024-01-28 DIAGNOSIS — Z8673 Personal history of transient ischemic attack (TIA), and cerebral infarction without residual deficits: Secondary | ICD-10-CM

## 2024-01-28 DIAGNOSIS — M810 Age-related osteoporosis without current pathological fracture: Secondary | ICD-10-CM | POA: Diagnosis present

## 2024-01-28 DIAGNOSIS — Z91148 Patient's other noncompliance with medication regimen for other reason: Secondary | ICD-10-CM

## 2024-01-28 DIAGNOSIS — M86172 Other acute osteomyelitis, left ankle and foot: Secondary | ICD-10-CM | POA: Diagnosis present

## 2024-01-28 DIAGNOSIS — M86672 Other chronic osteomyelitis, left ankle and foot: Secondary | ICD-10-CM | POA: Diagnosis not present

## 2024-01-28 DIAGNOSIS — Z91048 Other nonmedicinal substance allergy status: Secondary | ICD-10-CM

## 2024-01-28 LAB — COMPREHENSIVE METABOLIC PANEL WITH GFR
ALT: 41 U/L (ref 0–44)
AST: 43 U/L — ABNORMAL HIGH (ref 15–41)
Albumin: 2.2 g/dL — ABNORMAL LOW (ref 3.5–5.0)
Alkaline Phosphatase: 109 U/L (ref 38–126)
Anion gap: 10 (ref 5–15)
BUN: 23 mg/dL (ref 8–23)
CO2: 23 mmol/L (ref 22–32)
Calcium: 8.1 mg/dL — ABNORMAL LOW (ref 8.9–10.3)
Chloride: 104 mmol/L (ref 98–111)
Creatinine, Ser: 1.45 mg/dL — ABNORMAL HIGH (ref 0.44–1.00)
GFR, Estimated: 35 mL/min — ABNORMAL LOW (ref >60)
Glucose, Bld: 81 mg/dL (ref 70–99)
Potassium: 4.2 mmol/L (ref 3.5–5.1)
Sodium: 137 mmol/L (ref 135–145)
Total Bilirubin: 1.1 mg/dL (ref 0.0–1.2)
Total Protein: 6.4 g/dL — ABNORMAL LOW (ref 6.5–8.1)

## 2024-01-28 LAB — CBC WITH DIFFERENTIAL/PLATELET
Abs Immature Granulocytes: 0.1 K/uL — ABNORMAL HIGH (ref 0.00–0.07)
Basophils Absolute: 0 K/uL (ref 0.0–0.1)
Basophils Relative: 0 %
Eosinophils Absolute: 0.1 K/uL (ref 0.0–0.5)
Eosinophils Relative: 1 %
HCT: 28.2 % — ABNORMAL LOW (ref 36.0–46.0)
Hemoglobin: 8.9 g/dL — ABNORMAL LOW (ref 12.0–15.0)
Immature Granulocytes: 1 %
Lymphocytes Relative: 4 %
Lymphs Abs: 0.6 K/uL — ABNORMAL LOW (ref 0.7–4.0)
MCH: 32.6 pg (ref 26.0–34.0)
MCHC: 31.6 g/dL (ref 30.0–36.0)
MCV: 103.3 fL — ABNORMAL HIGH (ref 80.0–100.0)
Monocytes Absolute: 1.5 K/uL — ABNORMAL HIGH (ref 0.1–1.0)
Monocytes Relative: 10 %
Neutro Abs: 12.8 K/uL — ABNORMAL HIGH (ref 1.7–7.7)
Neutrophils Relative %: 84 %
Platelets: 340 K/uL (ref 150–400)
RBC: 2.73 MIL/uL — ABNORMAL LOW (ref 3.87–5.11)
RDW: 16 % — ABNORMAL HIGH (ref 11.5–15.5)
WBC: 15.1 K/uL — ABNORMAL HIGH (ref 4.0–10.5)
nRBC: 0.2 % (ref 0.0–0.2)

## 2024-01-28 LAB — TROPONIN I (HIGH SENSITIVITY): Troponin I (High Sensitivity): 17 ng/L (ref <18)

## 2024-01-28 LAB — I-STAT CG4 LACTIC ACID, ED: Lactic Acid, Venous: 1.5 mmol/L (ref 0.5–1.9)

## 2024-01-28 LAB — PROTIME-INR
INR: 1.9 — ABNORMAL HIGH (ref 0.8–1.2)
Prothrombin Time: 22.8 s — ABNORMAL HIGH (ref 11.4–15.2)

## 2024-01-28 LAB — RESP PANEL BY RT-PCR (RSV, FLU A&B, COVID)  RVPGX2
Influenza A by PCR: NEGATIVE
Influenza B by PCR: NEGATIVE
Resp Syncytial Virus by PCR: NEGATIVE
SARS Coronavirus 2 by RT PCR: NEGATIVE

## 2024-01-28 LAB — BRAIN NATRIURETIC PEPTIDE: B Natriuretic Peptide: 2107.2 pg/mL — ABNORMAL HIGH (ref 0.0–100.0)

## 2024-01-28 MED ORDER — ACETAMINOPHEN 650 MG RE SUPP: 650.0000 mg | Freq: Four times a day (QID) | RECTAL | Status: DC | PRN

## 2024-01-28 MED ORDER — POTASSIUM CHLORIDE CRYS ER 10 MEQ PO TBCR
10.0000 meq | EXTENDED_RELEASE_TABLET | Freq: Two times a day (BID) | ORAL | Status: DC
  Administered 2024-01-29 (×3): 10 meq via ORAL
  Filled 2024-01-28 (×4): qty 1

## 2024-01-28 MED ORDER — CLOPIDOGREL BISULFATE 75 MG PO TABS
75.0000 mg | ORAL_TABLET | Freq: Every morning | ORAL | Status: DC
  Filled 2024-01-28: qty 1

## 2024-01-28 MED ORDER — SODIUM CHLORIDE 0.9 % IV SOLN
100.0000 mg | Freq: Two times a day (BID) | INTRAVENOUS | Status: DC
  Administered 2024-01-29 – 2024-01-31 (×6): 100 mg via INTRAVENOUS
  Filled 2024-01-28 (×7): qty 100

## 2024-01-28 MED ORDER — PRAVASTATIN SODIUM 40 MG PO TABS
40.0000 mg | ORAL_TABLET | Freq: Every day | ORAL | Status: DC
  Administered 2024-01-29 – 2024-02-01 (×4): 40 mg via ORAL
  Filled 2024-01-28 (×4): qty 1

## 2024-01-28 MED ORDER — AMIODARONE HCL 200 MG PO TABS
200.0000 mg | ORAL_TABLET | Freq: Every morning | ORAL | Status: DC
  Administered 2024-01-29 – 2024-02-01 (×4): 200 mg via ORAL
  Filled 2024-01-28 (×4): qty 1

## 2024-01-28 MED ORDER — VANCOMYCIN HCL 750 MG/150ML IV SOLN
20.0000 mg/kg | Freq: Once | INTRAVENOUS | Status: AC
  Administered 2024-01-28: 750 mg via INTRAVENOUS
  Filled 2024-01-28: qty 150

## 2024-01-28 MED ORDER — TORSEMIDE 20 MG PO TABS
20.0000 mg | ORAL_TABLET | Freq: Every day | ORAL | Status: DC
  Administered 2024-01-29: 20 mg via ORAL
  Filled 2024-01-28 (×2): qty 1

## 2024-01-28 MED ORDER — IPRATROPIUM-ALBUTEROL 0.5-2.5 (3) MG/3ML IN SOLN
3.0000 mL | RESPIRATORY_TRACT | Status: AC
  Administered 2024-01-28 (×3): 3 mL via RESPIRATORY_TRACT
  Filled 2024-01-28 (×2): qty 3

## 2024-01-28 MED ORDER — SODIUM CHLORIDE 0.9 % IV SOLN
2.0000 g | INTRAVENOUS | Status: DC
  Administered 2024-01-29: 2 g via INTRAVENOUS
  Filled 2024-01-28: qty 12.5

## 2024-01-28 MED ORDER — MIDAZOLAM HCL (PF) 2 MG/2ML IJ SOLN
0.5000 mg | Freq: Once | INTRAMUSCULAR | Status: AC | PRN
Start: 2024-01-28 — End: 2024-01-28
  Administered 2024-01-28: 0.5 mg via INTRAVENOUS
  Filled 2024-01-28: qty 2

## 2024-01-28 MED ORDER — METHYLPREDNISOLONE SODIUM SUCC 125 MG IJ SOLR
125.0000 mg | Freq: Once | INTRAMUSCULAR | Status: AC
  Administered 2024-01-28: 125 mg via INTRAVENOUS
  Filled 2024-01-28: qty 2

## 2024-01-28 MED ORDER — SODIUM CHLORIDE 0.9 % IV SOLN
2.0000 g | Freq: Once | INTRAVENOUS | Status: AC
  Administered 2024-01-28: 2 g via INTRAVENOUS
  Filled 2024-01-28: qty 12.5

## 2024-01-28 MED ORDER — LEVOTHYROXINE SODIUM 75 MCG PO TABS
75.0000 ug | ORAL_TABLET | Freq: Every day | ORAL | Status: DC
  Administered 2024-01-29 – 2024-01-31 (×3): 75 ug via ORAL
  Filled 2024-01-28 (×4): qty 1

## 2024-01-28 MED ORDER — PANTOPRAZOLE SODIUM 40 MG PO TBEC
40.0000 mg | DELAYED_RELEASE_TABLET | Freq: Every day | ORAL | Status: DC
  Administered 2024-01-29 – 2024-02-01 (×4): 40 mg via ORAL
  Filled 2024-01-28 (×4): qty 1

## 2024-01-28 MED ORDER — METOPROLOL TARTRATE 50 MG PO TABS
75.0000 mg | ORAL_TABLET | Freq: Two times a day (BID) | ORAL | Status: DC
  Administered 2024-01-29 – 2024-02-01 (×7): 75 mg via ORAL
  Filled 2024-01-28 (×5): qty 1
  Filled 2024-01-28: qty 3
  Filled 2024-01-28: qty 1
  Filled 2024-01-28: qty 3
  Filled 2024-01-28: qty 1

## 2024-01-28 MED ORDER — ACETAMINOPHEN 325 MG PO TABS
650.0000 mg | ORAL_TABLET | Freq: Four times a day (QID) | ORAL | Status: DC | PRN
Start: 2024-01-28 — End: 2024-02-01

## 2024-01-28 NOTE — ED Notes (Signed)
Pt provided bag lunch 

## 2024-01-28 NOTE — ED Provider Notes (Signed)
 [place] EMERGENCY DEPARTMENT AT [facility] Provider Note   CSN: [id number] Arrival date & time: 01/28/24  1535     Patient presents with: No chief complaint on file.   [name] is a 83 y.o. female.   83 yo F with a chief complaints of shortness of breath.  Going on since she left the hospital.  Getting progressively worse.  Has oxygen at home but does not typically need it.  She denies chest pain denies pressure.  She went to see her podiatrist today and was noted to be hypoxic in clinic.  Concern also for possible osteomyelitis of the toe.  Plain film reportedly there without obvious signs of osteo.  Sent here for evaluation and possible admission.        Prior to Admission medications   Medication Sig Start Date End Date Taking? Authorizing Provider  acetaminophen  (TYLENOL ) 325 MG tablet Take 2 tablets (650 mg total) by mouth every 6 (six) hours as needed for mild pain (pain score 1-3) (or Fever >/= 101). 03/20/23   Lue Elsie BROCKS, MD  AMBULATORY NON FORMULARY MEDICATION Dispense one [facility] glove Patient not taking: Reported on 01/28/2024 09/25/22   Tat, Asberry RAMAN, DO  amiodarone  (PACERONE ) 200 MG tablet Take 200 mg by mouth every morning.    [provider]  apixaban  (ELIQUIS ) 2.5 MG TABS tablet Take 1 tablet (2.5 mg total) by mouth 2 (two) times daily. 08/14/23   Delford Maude BROCKS, MD  ascorbic acid  (VITAMIN C ) 500 MG tablet Take 500 mg by mouth in the morning.    [provider]  CALCIUM PO Take 1,200 mg by mouth 2 (two) times daily. 600 mg each    [provider]  Camphor-Menthol-Methyl Sal (SALONPAS) 3.04-14-08 % PTCH Place 1 patch onto the skin daily as needed (pain.).    [provider]  clopidogrel  (PLAVIX ) 75 MG tablet Take 1 tablet (75 mg total) by mouth every morning. 10/31/23   Delford Maude BROCKS, MD  denosumab  (PROLIA ) 60 MG/ML SOSY injection Inject 60 mg into the skin every 6 (six) months.    [name], MD   docusate sodium  (COLACE) 100 MG capsule Take 1 capsule (100 mg total) by mouth 2 (two) times daily. 01/22/24   Roann Gouty, MD  Ferrous Sulfate  (IRON PO) Take 65 mg by mouth at bedtime.    [provider]  gabapentin  (NEURONTIN ) 100 MG capsule Take 1 capsule (100 mg total) by mouth 3 (three) times daily. Patient not taking: Reported on 01/28/2024 12/31/23   Standiford, Marsa FALCON, DPM  HYDROcodone -acetaminophen  (NORCO/VICODIN) 5-325 MG tablet Take 1 tablet by mouth every 8 (eight) hours as needed for moderate pain (pain score 4-6) or severe pain (pain score 7-10). 01/13/24   [provider]  levothyroxine (SYNTHROID) 75 MCG tablet Take 75 mcg by mouth daily before breakfast.    [provider]  MAGNESIUM  OXIDE PO Take 500 mg by mouth every morning.    [provider]  metoprolol  tartrate (LOPRESSOR ) 25 MG tablet Take 3 tablets (75 mg total) by mouth 2 (two) times daily. 08/29/23   Nishan, Peter C, MD  Multiple Vitamin (MULTIVITAMIN WITH MINERALS) TABS tablet Take 1 tablet by mouth every morning.    [provider]  mupirocin  ointment (BACTROBAN ) 2 % Apply 1 Application topically daily. Patient taking differently: Apply 1 Application topically daily. Applied to wound on lower right leg 11/26/23   Standiford, Marsa FALCON, DPM  pantoprazole  (PROTONIX ) 40  MG tablet TAKE 1 TABLET DAILY 08/01/23   Nishan, Peter C, MD  potassium chloride  (KLOR-CON  M) 10 MEQ tablet Take 2 tablets (20 mEq total) by mouth daily. Patient taking differently: Take 10 mEq by mouth 2 (two) times daily. 12/12/23   Nishan, Peter C, MD  pravastatin  (PRAVACHOL ) 40 MG tablet Take 1 tablet (40 mg total) by mouth daily. 08/20/23   Nishan, Peter C, MD  predniSONE (DELTASONE) 5 MG tablet Take 5 mg by mouth daily at 6 (six) AM. X5 days for acute back pain Patient not taking: Reported on 01/28/2024 01/13/24   [provider]  torsemide  (DEMADEX ) 20 MG tablet Take 1 tablet (20 mg total) by  mouth 2 (two) times daily. Patient taking differently: Take 20 mg by mouth daily. 12/20/23   Nishan, Peter C, MD  umeclidinium bromide  (INCRUSE ELLIPTA ) 62.5 MCG/ACT AEPB Inhale 1 puff into the lungs daily. 08/08/23   Geronimo Amel, MD    Allergies: Iodine , Iohexol , Petroleum gauze non-woven 3x9 [wound dressings], Sulfa  antibiotics, Wound dressing adhesive, and Tape    Review of Systems  Updated Vital Signs BP (!) 149/66   Pulse 89   Temp 97.6 F (36.4 C) (Oral)   Resp (!) 27   SpO2 97%   Physical Exam Vitals and nursing note reviewed.  Constitutional:      General: She is not in acute distress.    Appearance: She is well-developed. She is not diaphoretic.  HENT:     Head: Normocephalic and atraumatic.  Eyes:     Pupils: Pupils are equal, round, and reactive to light.  Cardiovascular:     Rate and Rhythm: Normal rate and regular rhythm.     Heart sounds: No murmur heard.    No friction rub. No gallop.  Pulmonary:     Effort: Pulmonary effort is normal.     Breath sounds: No wheezing or rales.  Abdominal:     General: There is no distension.     Palpations: Abdomen is soft.     Tenderness: There is no abdominal tenderness.  Musculoskeletal:        General: No tenderness.     Cervical back: Normal range of motion and neck supple.     Comments: Patient has wounds to her left lower and right lower extremity.  There are pictures in her visit from her podiatrist today.  The wound on the left second digit does have a small amount of purulence.  Erythema to the toe.  I do not palpate any obvious pulses.  Cap refill less than 2 seconds.  Foot is warm.  Skin:    General: Skin is warm and dry.  Neurological:     Mental Status: She is alert and oriented to person, place, and time.  Psychiatric:        Behavior: Behavior normal.     (all labs ordered are listed, but only abnormal results are displayed) Labs Reviewed  CBC WITH DIFFERENTIAL/PLATELET - Abnormal; Notable for the  following components:      Result Value   WBC 15.1 (*)    RBC 2.73 (*)    Hemoglobin 8.9 (*)    HCT 28.2 (*)    MCV 103.3 (*)    RDW 16.0 (*)    Neutro Abs 12.8 (*)    Lymphs Abs 0.6 (*)    Monocytes Absolute 1.5 (*)    Abs Immature Granulocytes 0.10 (*)    All other components within normal limits  PROTIME-INR - Abnormal; Notable  for the following components:   Prothrombin Time 22.8 (*)    INR 1.9 (*)    All other components within normal limits  BRAIN NATRIURETIC PEPTIDE - Abnormal; Notable for the following components:   B Natriuretic Peptide 2,107.2 (*)    All other components within normal limits  COMPREHENSIVE METABOLIC PANEL WITH GFR - Abnormal; Notable for the following components:   Creatinine, Ser 1.45 (*)    Calcium 8.1 (*)    Total Protein 6.4 (*)    Albumin  2.2 (*)    AST 43 (*)    GFR, Estimated 35 (*)    All other components within normal limits  CULTURE, BLOOD (ROUTINE X 2)  CULTURE, BLOOD (ROUTINE X 2)  RESP PANEL BY RT-PCR (RSV, FLU A&B, COVID)  RVPGX2  URINALYSIS, W/ REFLEX TO CULTURE (INFECTION SUSPECTED)  I-STAT CG4 LACTIC ACID, ED  I-STAT CG4 LACTIC ACID, ED  TROPONIN I (HIGH SENSITIVITY)    EKG: EKG Interpretation Date/Time:  Tuesday January 28 2024 16:37:09 EDT Ventricular Rate:  84 PR Interval:  201 QRS Duration:  93 QT Interval:  437 QTC Calculation: 517 R Axis:   1  Text Interpretation: Sinus or ectopic atrial rhythm Abnormal R-wave progression, early transition LVH with secondary repolarization abnormality Prolonged QT interval No significant change since last tracing Confirmed by Emil Share [phone]) on 01/28/2024 7:49:49 PM  Radiology: [facility] Chest Port 1 View Result Date: 01/28/2024 CLINICAL DATA:  Questionable sepsis - evaluate for abnormality EXAM: PORTABLE CHEST - 1 VIEW COMPARISON:  07/24/2023 FINDINGS: Emphysema. Biapical pleural thickening. Hazy airspace opacities in the right upper lung zone. Mild cardiomegaly. Tortuous aorta with  aortic atherosclerosis. No acute fracture or destructive lesions. Multilevel thoracic osteophytosis. Diffuse osteopenia. Surgical clips in the superior left breast. IMPRESSION: Emphysema. Hazy airspace opacities in the right upper lung zone, worrisome for a developing bronchopneumonia. Electronically Signed   By: Rogelia Myers M.D.   On: 01/28/2024 19:16   DG Foot Complete Left Result Date: 01/28/2024 Please see detailed radiograph report in office note.    .Critical Care  Performed by: Emil Share, DO Authorized by: Emil Share, DO   Critical care provider statement:    Critical care time (minutes):  35   Critical care time was exclusive of:  Separately billable procedures and treating other patients   Critical care was time spent personally by me on the following activities:  Development of treatment plan with patient or surrogate, discussions with consultants, evaluation of patient's response to treatment, examination of patient, ordering and review of laboratory studies, ordering and review of radiographic studies, ordering and performing treatments and interventions, pulse oximetry, re-evaluation of patient's condition and review of old charts   Care discussed with: admitting provider      Medications Ordered in the ED  vancomycin  (VANCOREADY) IVPB 750 mg/150 mL (has no administration in time range)  ceFEPIme  (MAXIPIME ) 2 g in sodium chloride  0.9 % 100 mL IVPB (has no administration in time range)  ipratropium-albuterol  (DUONEB) 0.5-2.5 (3) MG/3ML nebulizer solution 3 mL (3 mLs Nebulization Given 01/28/24 1907)  methylPREDNISolone  sodium succinate (SOLU-MEDROL ) 125 mg/2 mL injection 125 mg (125 mg Intravenous Given 01/28/24 1836)                                    Medical Decision Making Amount and/or Complexity of Data Reviewed Labs: ordered. Radiology: ordered.  Risk Prescription drug management.   83 yo F  with a chief complaints of difficulty breathing and a wound to her  left foot.  Patient was recently in the hospital with what sounds like claudication and required angiography.  Patient has been home for about 6 days.  Since then progressive difficulty breathing.  Denies cough or congestion.  Her podiatrist today and was noted to be hypoxic with an oxygen saturation in the 70s.  Has a prescription for oxygen but does not typically require it per her and her husband.  Will obtain a laboratory evaluation.  Chest x-ray.  I independently interpreted the views of the left foot taken in the podiatry clinic no obvious osteo.  Patient with BNP likely at baseline.  Troponin negative.  No significant electrolyte abnormalities.  Chest x-ray with concerning right upper lobe pathology.  With hypoxia and fatigue will start on antibiotics.  With recent admission likely needs cover for HCAP.  Discussed with ED clinical pharmacist Vanco and cefepime  will also likely cover osteo.  I discussed with the podiatrist will see in the hospital.  Will discuss with medicine for admission.  The patients results and plan were reviewed and discussed.   Any x-rays performed were independently reviewed by myself.   Differential diagnosis were considered with the presenting HPI.  Medications  vancomycin  (VANCOREADY) IVPB 750 mg/150 mL (has no administration in time range)  ceFEPIme  (MAXIPIME ) 2 g in sodium chloride  0.9 % 100 mL IVPB (has no administration in time range)  ipratropium-albuterol  (DUONEB) 0.5-2.5 (3) MG/3ML nebulizer solution 3 mL (3 mLs Nebulization Given 01/28/24 1907)  methylPREDNISolone  sodium succinate (SOLU-MEDROL ) 125 mg/2 mL injection 125 mg (125 mg Intravenous Given 01/28/24 1836)    Vitals:   01/28/24 1540 01/28/24 1900 01/28/24 1930  BP: (!) 156/112 (!) 111/98 (!) 149/66  Pulse: 87 84 89  Resp: 20 (!) 26 (!) 27  Temp: 97.6 F (36.4 C)    TempSrc: Oral    SpO2: 94% 97% 97%    Final diagnoses:  HCAP (healthcare-associated pneumonia)  Acute on chronic  respiratory failure with hypoxia (HCC)    Admission/ observation were discussed with the admitting physician, patient and/or family and they are comfortable with the plan.       Final diagnoses:  HCAP (healthcare-associated pneumonia)  Acute on chronic respiratory failure with hypoxia [facility])    ED Discharge Orders     None          Emil Share, DO 01/28/24 2030

## 2024-01-28 NOTE — Progress Notes (Signed)
 ED Pharmacy Antibiotic Sign Off An antibiotic consult was received from an ED provider for cefepime  per pharmacy dosing for HCAP. A chart review was completed to assess appropriateness.   The following one time order(s) were placed:  Cefepime  2g IV  Further antibiotic and/or antibiotic pharmacy consults should be ordered by the admitting provider if indicated.   Thank you for allowing pharmacy to be a part of this patient's care.   Maurilio Patten, PharmD PGY1 Pharmacy Resident [facility] 01/28/2024 8:21 PM

## 2024-01-28 NOTE — ED Triage Notes (Signed)
 Patient in ED after PCP sent them for a evaluation. Patient states she is hypoxic, shob, and possibly has a infection in her left 2nd toe.

## 2024-01-28 NOTE — ED Notes (Signed)
 Phlebotomy asked to attempt for second set of blood cultures prior to abx start.

## 2024-01-28 NOTE — ED Notes (Signed)
 CCMD notified

## 2024-01-28 NOTE — Progress Notes (Signed)
 Pharmacy Antibiotic Note  [name] is a 83 y.o. female admitted on 01/28/2024 with rule out osteomyelitis, possible PNA.  Pharmacy has been consulted for Vancomycin  dosing. WBC is elevated. Noted renal dysfunction.   Plan: Vancomycin  500 mg IV q48h >>>Estimated AUC: 505 Cefepime  per MD Trend WBC, temp, renal function  F/U infectious work-up Drug levels as indicated   Temp (24hrs), Avg:96.7 F (35.9 C), Min:94.5 F (34.7 C), Max:98.1 F (36.7 C)  Recent Labs  Lab 01/22/24 0409 01/28/24 1652 01/28/24 1754 01/28/24 1850  WBC 15.5* 15.1*  --   --   CREATININE 1.67*  --   --  1.45*  LATICACIDVEN  --   --  1.5  --     Estimated Creatinine Clearance: 16.8 mL/min (A) (by C-G formula based on SCr of 1.45 mg/dL (H)).    Allergies  Allergen Reactions   Iodine  Rash and Other (See Comments)    BETADINE, blistering rash/burns   Iohexol  Rash and Other (See Comments)    Blisters; PT NEEDS 13-HOUR PREP    Petroleum Gauze Non-Woven 3x9 [Wound Dressings] Other (See Comments)    BURNING SENSATION, RED SKIN   Sulfa  Antibiotics Nausea And Vomiting   Wound Dressing Adhesive Other (See Comments)    Burns skin   Tape Itching, Rash and Other (See Comments)    DO NOT USE ADHESIVE TAPE Not even a Band Aid NO PAPER TAPE Burns/tears skin Skin is very very thin   Lynwood Mckusick, PharmD, BCPS Clinical Pharmacist Phone: [phone]

## 2024-01-28 NOTE — H&P (Signed)
 History and Physical    [name] FMW:[id number] DOB: [birth year] DOA: 01/28/2024  Patient coming from: Home.  Chief Complaint: Left foot infection and shortness of breath.  HPI: [name] is a 83 y.o. female with with history of COPD, chronic HFrEF last EF measured was 45% on April 2025 atrial fibrillation, CKD stage III, anemia of chronic disease, CAD, prior breast cancer, remote CVA, peripheral artery disease status post multiple angioplasties and stents who was recently admitted to the hospital and underwent left popliteal artery intervention with intravascular lithotripsy and drug-coated balloon angioplasty for in-stent restenosis via femoral access on January 20, 2024 had followed up with podiatrist Dr. Malvin today and patient was found to be having new lesions on the left foot 2nd and 5th toe and in addition patient also was hypoxic at the office with oxygen saturation in 70%.  Was referred to the ER.  Patient states that since her discharge on January 22, 2024 patient has been progressively getting more short of breath.  Denies any chest pain or productive cough.  Has been compliant with her medications.  ED Course: In the ER patient required 2 L oxygen.  Chest x-ray was showing which is concerning for pneumonia.BNP was 2100.  WBC was 15.1 hemoglobin 8.9.  Troponin was 17.  EKG was showing normal sinus rhythm.  Patient was started on empiric antibiotics and admitted for further management of shortness of breath could be a combination of CHF versus pneumonia versus COPD.  And also for further management of the left foot possible osteomyelitis.   Review of Systems: As per HPI, rest all negative.   Past Medical History:  Diagnosis Date   Anemia 05/2014   Atrial fibrillation (HCC)    x 3 yrs   Breast cancer (HCC)    S/P left mastectomy and chemotherapy 1989 remained in remission   Carotid artery occlusion    Carotid bruit    LICA 60-79% (duplex 9/11)   Degenerative  arthritis of spine 2015   Chronic back pain   Hyperlipidemia    Hypertension    Meningioma (HCC)    Osteoporosis    Peripheral arterial disease    Pneumonia 05/19/2014   Stroke [facility])    CVA 2008   Subclavian steal syndrome    Ulcers of both lower extremities (HCC) 2015    Past Surgical History:  Procedure Laterality Date   ABDOMINAL AORTOGRAM W/LOWER EXTREMITY N/A 12/19/2021   Procedure: ABDOMINAL AORTOGRAM W/LOWER EXTREMITY;  Surgeon: Serene Gaile ORN, MD;  [facility]: [facility];  Service: Cardiovascular;  Laterality: N/A;   ABDOMINAL AORTOGRAM W/LOWER EXTREMITY Bilateral 08/28/2022   Procedure: ABDOMINAL AORTOGRAM W/LOWER EXTREMITY;  Surgeon: Serene Gaile ORN, MD;  Location: [facility];  Service: Cardiovascular;  Laterality: Bilateral;   ABDOMINAL AORTOGRAM W/LOWER EXTREMITY N/A 10/16/2022   Procedure: ABDOMINAL AORTOGRAM W/LOWER EXTREMITY;  Surgeon: Serene Gaile ORN, MD;  [facility]: [facility];  Service: Cardiovascular;  Laterality: N/A;   ABDOMINAL AORTOGRAM W/LOWER EXTREMITY N/A 02/26/2023   Procedure: ABDOMINAL AORTOGRAM W/LOWER EXTREMITY;  Surgeon: Serene Gaile ORN, MD;  [facility]: [facility];  Service: Cardiovascular;  Laterality: N/A;   ABDOMINAL AORTOGRAM W/LOWER EXTREMITY N/A 07/16/2023   Procedure: ABDOMINAL AORTOGRAM W/LOWER EXTREMITY;  Surgeon: Serene Gaile ORN, MD;  [facility];  Service: Cardiovascular;  Laterality: N/A;   AMPUTATION TOE Left 12/29/2021   Procedure: Incisional DEBRIDEMENT Left Foot with Amputation Left Great Toe;  Surgeon: Tobie Franky SQUIBB, DPM;  Location: [facility] OR;  Service: Podiatry;  Laterality: Left;   CARDIAC CATHETERIZATION     CATARACT EXTRACTION Bilateral 2013   COLONOSCOPY  11/17/2010   Procedure: COLONOSCOPY;  Surgeon: Claudis RAYMOND Rivet, MD;  Location: [facility];  Service: Endoscopy;  Laterality: N/A;  10:45 am   ESOPHAGOGASTRODUODENOSCOPY  11/17/2010   Procedure: ESOPHAGOGASTRODUODENOSCOPY (EGD);  Surgeon:  Claudis RAYMOND Rivet, MD;  Location: [facility];  Service: Endoscopy;  Laterality: N/A;   ESOPHAGOGASTRODUODENOSCOPY N/A 02/16/2015   Procedure: ESOPHAGOGASTRODUODENOSCOPY (EGD);  Surgeon: Elspeth Deward Naval, MD;  Location: [facility] ENDOSCOPY;  Service: Gastroenterology;  Laterality: N/A;   EXPLORATORY LAPAROTOMY  1960s   For peritonitis of undetermined cause   EYE SURGERY     INCISION AND DRAINAGE OF WOUND Right 03/15/2023   Procedure: IRRIGATION AND DEBRIDEMENT WOUND;  Surgeon: Malvin Marsa FALCON, DPM;  Location: [facility];  Service: Orthopedics/Podiatry;  Laterality: Right;  Right ankle ulcer debridement, bone biopsy, graft application   LOWER EXTREMITY ANGIOGRAPHY N/A 07/16/2023   Procedure: Lower Extremity Angiography;  Surgeon: Serene Gaile ORN, MD;  [facility];  Service: Cardiovascular;  Laterality: N/A;   LOWER EXTREMITY ANGIOGRAPHY N/A 01/20/2024   Procedure: Lower Extremity Angiography;  Surgeon: Pearline Norman RAMAN, MD;  Location: [facility] INVASIVE CV LAB;  Service: Cardiovascular;  Laterality: N/A;   LOWER EXTREMITY INTERVENTION Left 07/16/2023   Procedure: LOWER EXTREMITY INTERVENTION;  Surgeon: Serene Gaile ORN, MD;  [facility];  Service: Cardiovascular;  Laterality: Left;  SFA laser   LOWER EXTREMITY INTERVENTION  01/20/2024   Procedure: LOWER EXTREMITY INTERVENTION;  Surgeon: Pearline Norman RAMAN, MD;  Location: [facility] INVASIVE CV LAB;  Service: Cardiovascular;;   LUMBAR EPIDURAL INJECTION  06-2012--06-2013   pt. states she has had 5 epidurals in 06-2012----06-2013   MASTECTOMY Left 1990   PERIPHERAL INTRAVASCULAR LITHOTRIPSY Left 01/20/2024   Procedure: PERIPHERAL INTRAVASCULAR LITHOTRIPSY;  Surgeon: Pearline Norman RAMAN, MD;  Location: [facility] INVASIVE CV LAB;  Service: Cardiovascular;  Laterality: Left;   PERIPHERAL VASCULAR ATHERECTOMY  08/28/2022   Procedure: PERIPHERAL VASCULAR ATHERECTOMY;  Surgeon: Serene Gaile ORN, MD;  [facility];  Service:  Cardiovascular;;  Lt Popliteal and AT   PERIPHERAL VASCULAR ATHERECTOMY  02/26/2023   Procedure: PERIPHERAL VASCULAR ATHERECTOMY;  Surgeon: Serene Gaile ORN, MD;  [facility];  Service: Cardiovascular;;   PERIPHERAL VASCULAR BALLOON ANGIOPLASTY  08/28/2022   Procedure: PERIPHERAL VASCULAR BALLOON ANGIOPLASTY;  Surgeon: Serene Gaile ORN, MD;  [facility];  Service: Cardiovascular;;  Lt Popliteal and AT   PERIPHERAL VASCULAR BALLOON ANGIOPLASTY  10/16/2022   Procedure: PERIPHERAL VASCULAR BALLOON ANGIOPLASTY;  Surgeon: Serene Gaile ORN, MD;  [facility];  Service: Cardiovascular;;  Rt AT   PERIPHERAL VASCULAR BALLOON ANGIOPLASTY  02/26/2023   Procedure: PERIPHERAL VASCULAR BALLOON ANGIOPLASTY;  Surgeon: Serene Gaile ORN, MD;  [facility];  Service: Cardiovascular;;   PERIPHERAL VASCULAR INTERVENTION Left 12/19/2021   Procedure: PERIPHERAL VASCULAR INTERVENTION;  Surgeon: Serene Gaile ORN, MD;  [facility];  Service: Cardiovascular;  Laterality: Left;  SFA and PTA of POP   YAG LASER APPLICATION Right 05/03/2015   Procedure: YAG LASER APPLICATION;  Surgeon: Oneil Platts, MD;  Location: [facility] OR[facility];  Service: Ophthalmology;  Laterality: Right;  right   YAG LASER APPLICATION Left 05/24/2015   Procedure: YAG LASER APPLICATION;  Surgeon: Oneil Platts, MD;  Location: [facility];  Service: Ophthalmology;  Laterality: Left;  reports that she quit smoking about 17 years ago. Her smoking use included cigarettes. She started smoking about 73 years ago. She has a 56 pack-year smoking history. She has never been exposed to tobacco smoke. She has never used smokeless tobacco. She reports that she does not drink alcohol and does not use drugs.  Allergies  Allergen Reactions   Iodine  Rash and Other (See Comments)    BETADINE, blistering rash/burns   Iohexol  Rash and Other (See Comments)    Blisters; PT NEEDS 13-HOUR PREP    Petroleum  Gauze Non-Woven 3x9 [Wound Dressings] Other (See Comments)    BURNING SENSATION, RED SKIN   Sulfa  Antibiotics Nausea And Vomiting   Wound Dressing Adhesive Other (See Comments)    Burns skin   Tape Itching, Rash and Other (See Comments)    DO NOT USE ADHESIVE TAPE Not even a Band Aid NO PAPER TAPE Burns/tears skin Skin is very very thin    Family History  Problem Relation Age of Onset   Alzheimer's disease Mother    Dementia Mother    Hypertension Mother    Hyperlipidemia Mother    Parkinsonism Father     Prior to Admission medications   Medication Sig Start Date End Date Taking? Authorizing Provider  acetaminophen  (TYLENOL ) 325 MG tablet Take 2 tablets (650 mg total) by mouth every 6 (six) hours as needed for mild pain (pain score 1-3) (or Fever >/= 101). 03/20/23   Lue Elsie BROCKS, MD  AMBULATORY NON FORMULARY MEDICATION Dispense one [facility]i [facility] glove Patient not taking: Reported on 01/28/2024 09/25/22   Tat, Asberry RAMAN, DO  amiodarone  (PACERONE ) 200 MG tablet Take 200 mg by mouth every morning.    [provider]  apixaban  (ELIQUIS ) 2.5 MG TABS tablet Take 1 tablet (2.5 mg total) by mouth 2 (two) times daily. 08/14/23   Delford Maude BROCKS, MD  ascorbic acid  (VITAMIN C ) 500 MG tablet Take 500 mg by mouth in the morning.    [provider]  CALCIUM PO Take 1,200 mg by mouth 2 (two) times daily. 600 mg each    [provider]  Camphor-Menthol-Methyl Sal (SALONPAS) 3.04-14-08 % PTCH Place 1 patch onto the skin daily as needed (pain.).    [provider]  clopidogrel  (PLAVIX ) 75 MG tablet Take 1 tablet (75 mg total) by mouth every morning. 10/31/23   Nishan, Peter C, MD  denosumab  (PROLIA ) 60 MG/ML SOSY injection Inject 60 mg into the skin every 6 (six) months.    [name], MD  docusate sodium  (COLACE) 100 MG capsule Take 1 capsule (100 mg total) by mouth 2 (two) times daily. 01/22/24   Roann Gouty, MD  Ferrous Sulfate  (IRON PO) Take 65 mg by  mouth at bedtime.    [provider]  gabapentin  (NEURONTIN ) 100 MG capsule Take 1 capsule (100 mg total) by mouth 3 (three) times daily. Patient not taking: Reported on 01/28/2024 12/31/23   Standiford, Marsa FALCON, DPM  HYDROcodone -acetaminophen  (NORCO/VICODIN) 5-325 MG tablet Take 1 tablet by mouth every 8 (eight) hours as needed for moderate pain (pain score 4-6) or severe pain (pain score 7-10). 01/13/24   [provider]  levothyroxine (SYNTHROID) 75 MCG tablet Take 75 mcg by mouth daily before breakfast.    [provider]  MAGNESIUM  OXIDE PO Take 500 mg by mouth every morning.    [provider]  metoprolol  tartrate (LOPRESSOR ) 25 MG tablet Take 3 tablets (75 mg total) by mouth 2 (two) times  daily. 08/29/23   Nishan, Peter C, MD  Multiple Vitamin (MULTIVITAMIN WITH MINERALS) TABS tablet Take 1 tablet by mouth every morning.    [provider]  mupirocin  ointment (BACTROBAN ) 2 % Apply 1 Application topically daily. Patient taking differently: Apply 1 Application topically daily. Applied to wound on lower right leg 11/26/23   Standiford, Marsa FALCON, DPM  pantoprazole  (PROTONIX ) 40 MG tablet TAKE 1 TABLET DAILY 08/01/23   Nishan, Peter C, MD  potassium chloride  (KLOR-CON  M) 10 MEQ tablet Take 2 tablets (20 mEq total) by mouth daily. Patient taking differently: Take 10 mEq by mouth 2 (two) times daily. 12/12/23   Delford Maude BROCKS, MD  pravastatin  (PRAVACHOL ) 40 MG tablet Take 1 tablet (40 mg total) by mouth daily. 08/20/23   Nishan, Peter C, MD  predniSONE (DELTASONE) 5 MG tablet Take 5 mg by mouth daily at 6 (six) AM. X5 days for acute back pain Patient not taking: Reported on 01/28/2024 01/13/24   [provider]  torsemide  (DEMADEX ) 20 MG tablet Take 1 tablet (20 mg total) by mouth 2 (two) times daily. Patient taking differently: Take 20 mg by mouth daily. 12/20/23   Nishan, Peter C, MD  umeclidinium bromide  (INCRUSE ELLIPTA ) 62.5 MCG/ACT AEPB  Inhale 1 puff into the lungs daily. 08/08/23   Geronimo Amel, MD    Physical Exam: Constitutional: Moderately built and nourished. Vitals:   01/28/24 2130 01/28/24 2145 01/28/24 2150 01/28/24 2300  BP: 133/63 125/67  133/78  Pulse: 89 89  90  Resp: (!) 23 (!) 22  (!) 22  Temp:   98.1 F (36.7 C)   TempSrc:   Oral   SpO2: 96% 94%  95%   Eyes: Anicteric no pallor. ENMT: No discharge from the ears eyes nose or mouth. Neck: No mass felt.  No neck rigidity. Respiratory: No rhonchi or crepitations. Cardiovascular: S1-S2 heard. Abdomen: Soft nontender bowel sound present. Musculoskeletal: Left foot has swelling on the second toe and the left fifth toe. Skin: Has ulcer on the right foot lateral malleolus and also discoloration of the left foot second toe and fifth toe. Neurologic: Alert awake oriented to time place and person.  Moves all extremities. Psychiatric: Appears normal.  Normal affect.   Labs on Admission: I have personally reviewed following labs and imaging studies  CBC: Recent Labs  Lab 01/22/24 0409 01/28/24 1652  WBC 15.5* 15.1*  NEUTROABS  --  12.8*  HGB 9.3* 8.9*  HCT 28.9* 28.2*  MCV 101.4* 103.3*  PLT 292 340   Basic Metabolic Panel: Recent Labs  Lab 01/22/24 0409 01/28/24 1850  NA 139 137  K 3.9 4.2  CL 110 104  CO2 22 23  GLUCOSE 96 81  BUN 40* 23  CREATININE 1.67* 1.45*  CALCIUM 7.8* 8.1*   GFR: Estimated Creatinine Clearance: 16.8 mL/min (A) (by C-G formula based on SCr of 1.45 mg/dL (H)). Liver Function Tests: Recent Labs  Lab 01/28/24 1850  AST 43*  ALT 41  ALKPHOS 109  BILITOT 1.1  PROT 6.4*  ALBUMIN  2.2*   No results for input(s): LIPASE, AMYLASE in the last 168 hours. No results for input(s): AMMONIA in the last 168 hours. Coagulation Profile: Recent Labs  Lab 01/28/24 1652  INR 1.9*   Cardiac Enzymes: No results for input(s): CKTOTAL, CKMB, CKMBINDEX, TROPONINI in the last 168 hours. BNP (last 3  results) No results for input(s): PROBNP in the last 8760 hours. HbA1C: No results for input(s): HGBA1C in the last 72 hours.  CBG: No results for input(s): GLUCAP in the last 168 hours. Lipid Profile: No results for input(s): CHOL, HDL, LDLCALC, TRIG, CHOLHDL, LDLDIRECT in the last 72 hours. Thyroid  Function Tests: No results for input(s): TSH, T4TOTAL, FREET4, T3FREE, THYROIDAB in the last 72 hours. Anemia Panel: No results for input(s): VITAMINB12, FOLATE, FERRITIN, TIBC, IRON, RETICCTPCT in the last 72 hours. Urine analysis:    Component Value Date/Time   COLORURINE YELLOW 03/13/2023 1511   APPEARANCEUR CLEAR 03/13/2023 1511   LABSPEC 1.012 03/13/2023 1511   PHURINE 5.0 03/13/2023 1511   GLUCOSEU NEGATIVE 03/13/2023 1511   HGBUR NEGATIVE 03/13/2023 1511   BILIRUBINUR NEGATIVE 03/13/2023 1511   KETONESUR NEGATIVE 03/13/2023 1511   PROTEINUR 30 (A) 03/13/2023 1511   UROBILINOGEN 0.2 02/15/2015 0417   NITRITE NEGATIVE 03/13/2023 1511   LEUKOCYTESUR NEGATIVE 03/13/2023 1511   Sepsis Labs: @LABRCNTIP (procalcitonin:4,lacticidven:4) ) Recent Results (from the past 240 hours)  Resp panel by RT-PCR (RSV, Flu A&B, Covid) Anterior Nasal Swab     Status: None   Collection Time: 01/28/24  6:36 PM   Specimen: Anterior Nasal Swab  Result Value Ref Range Status   SARS Coronavirus 2 by RT PCR NEGATIVE NEGATIVE Final   Influenza A by PCR NEGATIVE NEGATIVE Final   Influenza B by PCR NEGATIVE NEGATIVE Final    Comment: (NOTE) The Xpert Xpress SARS-CoV-2/FLU/RSV plus assay is intended as an aid in the diagnosis of influenza from Nasopharyngeal swab specimens and should not be used as a sole basis for treatment. Nasal washings and aspirates are unacceptable for Xpert Xpress SARS-CoV-2/FLU/RSV testing.  Fact Sheet for Patients: BloggerCourse.com  Fact Sheet for Healthcare  Providers: SeriousBroker.it  This test is not yet approved or cleared by the [place]  FDA and has been authorized for detection and/or diagnosis of SARS-CoV-2 by FDA under an Emergency Use Authorization (EUA). This EUA will remain in effect (meaning this test can be used) for the duration of the COVID-19 declaration under Section 564(b)(1) of the Act, 21 U.S.C. section 360bbb-3(b)(1), unless the authorization is terminated or revoked.     Resp Syncytial Virus by PCR NEGATIVE NEGATIVE Final    Comment: (NOTE) Fact Sheet for Patients: BloggerCourse.com  Fact Sheet for Healthcare Providers: SeriousBroker.it  This test is not yet approved or cleared by the [place]  FDA and has been authorized for detection and/or diagnosis of SARS-CoV-2 by FDA under an Emergency Use Authorization (EUA). This EUA will remain in effect (meaning this test can be used) for the duration of the COVID-19 declaration under Section 564(b)(1) of the Act, 21 U.S.C. section 360bbb-3(b)(1), unless the authorization is terminated or revoked.  Performed at [facility], [address] [place], [place] [address]      Radiological Exams on Admission: DG Chest Port 1 View Result Date: 01/28/2024 CLINICAL DATA:  Questionable sepsis - evaluate for abnormality EXAM: PORTABLE CHEST - 1 VIEW COMPARISON:  07/24/2023 FINDINGS: Emphysema. Biapical pleural thickening. Hazy airspace opacities in the right upper lung zone. Mild cardiomegaly. Tortuous aorta with aortic atherosclerosis. No acute fracture or destructive lesions. Multilevel thoracic osteophytosis. Diffuse osteopenia. Surgical clips in the superior left breast. IMPRESSION: Emphysema. Hazy airspace opacities in the right upper lung zone, worrisome for a developing bronchopneumonia. Electronically Signed   By: Rogelia Myers M.D.   On: 01/28/2024 19:16   DG Foot Complete  Left Result Date: 01/28/2024 Please see detailed radiograph report in office note.   EKG: Independently reviewed.  Normal sinus rhythm prolonged QT.  Assessment/Plan Principal Problem:  Pneumonia Active Problems:   Persistent atrial fibrillation (HCC)   ACUTE ON CHRONIC DIASTOLIC HEART FAILURE   COPD with acute exacerbation (HCC)    Shortness of breath could be a combination of possible developing pneumonia/COPD with CHF.  Last EF measured was 45% in April 2025.  Patient states she has not taken her torsemide  today which I have ordered.  Will continue with empiric antibiotics and also scheduled and as needed DuoNebs and Pulmicort.  Patient did receive One dose of IV Solu-Medrol  may consider further doses if patient continues to wheeze. Left foot 2nd and 5th toe concerning for osteomyelitis.  MRI is pending.  Podiatrist Dr. Pierce will be following. Peripheral artery disease status post multiple angioplasties last one was in January 20, 2024 when patient underwent procedure for left popliteal artery with intravascular lithotripsy and drug-coated balloon angioplasty and IntraStent renal stenosis.  Presently on antiplatelet agents statins and on heparin  infusion. History of A-fib takes Eliquis  but in anticipation of possible procedure for the left foot we will keep patient on heparin  infusion.  Continue amiodarone  and metoprolol . Chronic anemia follow CBC. Hypothyroidism on Synthroid. History of CVA on Eliquis  statins and antiplatelet agents. Chronic kidney disease stage III creatinine around baseline. CAD denies any chest pain.  Continue antiplatelet agents and statins.  Since patient has possible pneumonia with also concerning features for possible osteomyelitis will need close monitoring further workup and more than 2 midnight stay.   DVT prophylaxis: Heparin  infusion. Code Status: DNR. Family Communication: Discussed with patient. Disposition Plan: Monitored bed. Consults  called: Podiatrist. Admission status: Inpatient.

## 2024-01-28 NOTE — Progress Notes (Signed)
 Subjective:  Patient ID: [name], female    DOB: May 05, 1940,  MRN: [id number]  DOS: 03/15/2023 Procedure: Right ankle ulcer debridement with prep for graft, bone biopsies distal fibula, application of arthroflex dermal allograft  83 y.o. female returns for post-op check.   Following up on right lateral ankle ulceration as well as a new concern on the left foot.  Was recently hospitalized found to have left lower extremity PAD requiring vascular invention with angiogram.  She reports she has been feeling progressively weak and deconditioned since the admission.  Per history of the second toe on left foot appears to be worse than it was during the hospitalization.     Review of Systems: Negative except as noted in the HPI. Denies N/V/F/Ch.   Objective:   Vitals:   01/28/24 1506  BP: (!) 167/76  Pulse: 85  Temp: (!) 94.5 F (34.7 C)  SpO2: (!) 77%     There is no height or weight on file to calculate BMI. Constitutional Well developed. Well nourished.  Vascular Foot warm and well perfused. Capillary refill normal to all digits.  Calf is soft and supple, no posterior calf or knee pain, negative Homans' sign  Neurologic Normal speech. Oriented to person, place, and time. Epicritic sensation to light touch grossly present bilaterally.  Dermatologic Ulceration the right lateral ankle has improved with decreased maceration decrease erythema and healthy granular wound bed no drainage    Left foot lateral aspect fifth metatarsal head there is a small black eschar overlying ulceration unstageable depth of the lateral aspect of the fifth metatarsal head no drainage Concern for new redness and edema of the second toe on the left foot      Orthopedic: Tenderness to palpation noted about the surgical site.   Multiple view plain film radiographs:  XR 3 views AP lateral oblique left foot 01/28/2024: No obvious evidence of erosions in the left second toe or in the fifth metatarsal  head though I do question possible radiodensity in the fifth metatarsal head  XR 2 views AP and lateral right ankle 12/31/2023: No evidence of significant erosions of the distal fibula laterally   Path: Ulceration sent for pathology showed ulceration with acute inflammation and granulation tissue.  No specific diagnosis given as to any type of abnormal ulceration.  Bone biopsy right ankle distal fibula resulted with benign bone and connective tissue no inflammatory changes were identified.  Culture: Corynebacterium stratum, group A strep Assessment:   1. Ulcer of left foot with fat layer exposed (HCC)   2. Ankle ulcer, right, with fat layer exposed (HCC)   3. PAD (peripheral artery disease) with history of multiple angioplasty and stent placement      Plan:  Patient was evaluated and treated and all questions answered.  # Recurrence of ulceration right lateral ankle S/p foot surgery right ankle ulcer debridement with prep for graft fibular bone biopsy and dermal allograft application - Appears slightly improved today, lightly debrided - Continue with local wound care at this time as we had been previously with antibiotic ointment and gauze dressing - Recommend daily mupirocin  ointment application cover with dry gauze dressing - Previously obtained x-ray  did not show evidence of erosion of the lateral aspect of the fibula on the right ankle   # Ulceration of the left lateral fifth metatarsal head # Ulceration of the left second toe with erythema and edema -Repeat x-ray today due to concern over patient vitals.  Cannot rule out osteomyelitis in  the fifth metatarsal head.  No evidence of the second toe though clinically and concerned there - Given patient's abnormal vitals including abnormal oxygen saturation, hypertension, temp of 94.21F, I do recommend she proceed to the emergency department for further evaluation for possible sepsis.  She does appear visibly sick to me today and says  she is not feeling well.  This coupled with findings in the left foot have any concern for possible sepsis. - Recommend evaluation of labs, possible sepsis workup as indicated.  If she needs to be admitted would recommend MRI of the left foot to evaluate for possible osteomyelitis of the fifth metatarsal head and second toe - Will be available to consult as needed.  I called the emergency department charge nurse to inform her of the patient's planned arrival          [name], DPM [facility] / [facility]

## 2024-01-29 DIAGNOSIS — M86672 Other chronic osteomyelitis, left ankle and foot: Secondary | ICD-10-CM

## 2024-01-29 DIAGNOSIS — I739 Peripheral vascular disease, unspecified: Secondary | ICD-10-CM

## 2024-01-29 DIAGNOSIS — J189 Pneumonia, unspecified organism: Secondary | ICD-10-CM | POA: Diagnosis not present

## 2024-01-29 LAB — CBC
HCT: 27.4 % — ABNORMAL LOW (ref 36.0–46.0)
Hemoglobin: 8.6 g/dL — ABNORMAL LOW (ref 12.0–15.0)
MCH: 32.6 pg (ref 26.0–34.0)
MCHC: 31.4 g/dL (ref 30.0–36.0)
MCV: 103.8 fL — ABNORMAL HIGH (ref 80.0–100.0)
Platelets: 340 K/uL (ref 150–400)
RBC: 2.64 MIL/uL — ABNORMAL LOW (ref 3.87–5.11)
RDW: 15.9 % — ABNORMAL HIGH (ref 11.5–15.5)
WBC: 13.4 K/uL — ABNORMAL HIGH (ref 4.0–10.5)
nRBC: 0.2 % (ref 0.0–0.2)

## 2024-01-29 LAB — URINALYSIS, W/ REFLEX TO CULTURE (INFECTION SUSPECTED)
Bacteria, UA: NONE SEEN
Bilirubin Urine: NEGATIVE
Glucose, UA: NEGATIVE mg/dL
Hgb urine dipstick: NEGATIVE
Ketones, ur: NEGATIVE mg/dL
Leukocytes,Ua: NEGATIVE
Nitrite: NEGATIVE
Protein, ur: 30 mg/dL — AB
Specific Gravity, Urine: 1.016 (ref 1.005–1.030)
pH: 5 (ref 5.0–8.0)

## 2024-01-29 LAB — COMPREHENSIVE METABOLIC PANEL WITH GFR
ALT: 39 U/L (ref 0–44)
AST: 38 U/L (ref 15–41)
Albumin: 2.2 g/dL — ABNORMAL LOW (ref 3.5–5.0)
Alkaline Phosphatase: 102 U/L (ref 38–126)
Anion gap: 9 (ref 5–15)
BUN: 26 mg/dL — ABNORMAL HIGH (ref 8–23)
CO2: 20 mmol/L — ABNORMAL LOW (ref 22–32)
Calcium: 7.7 mg/dL — ABNORMAL LOW (ref 8.9–10.3)
Chloride: 108 mmol/L (ref 98–111)
Creatinine, Ser: 1.5 mg/dL — ABNORMAL HIGH (ref 0.44–1.00)
GFR, Estimated: 34 mL/min — ABNORMAL LOW (ref >60)
Glucose, Bld: 184 mg/dL — ABNORMAL HIGH (ref 70–99)
Potassium: 4.2 mmol/L (ref 3.5–5.1)
Sodium: 137 mmol/L (ref 135–145)
Total Bilirubin: 0.7 mg/dL (ref 0.0–1.2)
Total Protein: 6.4 g/dL — ABNORMAL LOW (ref 6.5–8.1)

## 2024-01-29 LAB — SEDIMENTATION RATE: Sed Rate: 100 mm/h — ABNORMAL HIGH (ref 0–22)

## 2024-01-29 LAB — APTT
aPTT: 49 s — ABNORMAL HIGH (ref 24–36)
aPTT: 59 s — ABNORMAL HIGH (ref 24–36)

## 2024-01-29 LAB — HEPARIN LEVEL (UNFRACTIONATED): Heparin Unfractionated: >1.1 [IU]/mL — ABNORMAL HIGH (ref 0.30–0.70)

## 2024-01-29 LAB — C-REACTIVE PROTEIN: CRP: 19.4 mg/dL — ABNORMAL HIGH (ref <1.0)

## 2024-01-29 MED ORDER — HEPARIN (PORCINE) 25000 UT/250ML-% IV SOLN
800.0000 [IU]/h | INTRAVENOUS | Status: DC
  Administered 2024-01-29: 600 [IU]/h via INTRAVENOUS
  Administered 2024-01-30: 800 [IU]/h via INTRAVENOUS
  Filled 2024-01-29 (×2): qty 250

## 2024-01-29 MED ORDER — BUDESONIDE 0.25 MG/2ML IN SUSP
0.2500 mg | Freq: Two times a day (BID) | RESPIRATORY_TRACT | Status: DC
  Administered 2024-01-29 – 2024-02-01 (×7): 0.25 mg via RESPIRATORY_TRACT
  Filled 2024-01-29 (×7): qty 2

## 2024-01-29 MED ORDER — VANCOMYCIN HCL 500 MG/100ML IV SOLN: 500.0000 mg | INTRAVENOUS | Status: DC

## 2024-01-29 MED ORDER — IPRATROPIUM-ALBUTEROL 0.5-2.5 (3) MG/3ML IN SOLN: 3.0000 mL | RESPIRATORY_TRACT | Status: DC | PRN

## 2024-01-29 MED ORDER — IPRATROPIUM-ALBUTEROL 0.5-2.5 (3) MG/3ML IN SOLN
3.0000 mL | RESPIRATORY_TRACT | Status: DC
  Administered 2024-01-29 – 2024-01-30 (×6): 3 mL via RESPIRATORY_TRACT
  Filled 2024-01-29 (×5): qty 3

## 2024-01-29 MED ORDER — MUPIROCIN 2 % EX OINT
1.0000 | TOPICAL_OINTMENT | Freq: Two times a day (BID) | CUTANEOUS | Status: DC
  Administered 2024-01-29 – 2024-02-01 (×6): 1 via NASAL
  Filled 2024-01-29 (×3): qty 22

## 2024-01-29 NOTE — ED Notes (Signed)
 RN attempted to collect labs without success. RN will notify phlebotomy.

## 2024-01-29 NOTE — Progress Notes (Addendum)
 Progress Note   Patient: [name] FMW:[id number] DOB: 1941/01/14 DOA: 01/28/2024     1 DOS: the patient was seen and examined on 01/29/2024   Brief hospital course:  83 y.o. female with with history of COPD, chronic HFrEF last EF measured was 45% on April 2025 atrial fibrillation, CKD stage III, anemia of chronic disease, CAD, prior breast cancer, remote CVA, peripheral artery disease status post multiple angioplasties and stents who was recently admitted to the hospital and underwent left popliteal artery intervention with intravascular lithotripsy and drug-coated balloon angioplasty for in-stent restenosis via femoral access on January 20, 2024 had followed up with podiatrist Dr. Malvin today and patient was found to be having new lesions on the left foot 2nd and 5th toe and in addition patient also was hypoxic at the office with oxygen saturation in 70%.  Was referred to the ER.  Patient states that since her discharge on January 22, 2024 patient has been progressively getting more short of breath.  Denies any chest pain or productive cough.  Has been compliant with her medications.  Podiatry on board.Plan for left second toe amputation and left partial versus total fifth metatarsal head resection possible wound closure to be done on 10/23.   Assessment and Plan:  Shortness of breath could be a combination of possible developing pneumonia/COPD with CHF. Last EF measured was 45% in April 2025.    Will continue with empiric antibiotics and also scheduled and as needed DuoNebs and Pulmicort.   Patient did receive One dose of IV Solu-Medrol  may consider further doses if patient continues to wheeze. She does have oxygen concentrator at home.  Left foot 2nd and 5th toe concerning for osteomyelitis.  MRI done.  Podiatrist Dr. Pierce on board. Plan for left second toe amputation and left partial versus total fifth metatarsal head resection possible wound closure to be done on 10/23.  H/o  left big toe amputation  Peripheral artery disease status post multiple angioplasties last one was in January 20, 2024 when patient underwent procedure for left popliteal artery with intravascular lithotripsy and drug-coated balloon angioplasty and IntraStent renal stenosis.  Presently on statin and heparin  infusion. Holding plavix .  History of A-fib takes Eliquis  but in anticipation of possible procedure for the left foot we will keep patient on heparin  infusion.  Continue amiodarone  and metoprolol .  Chronic anemia follow CBC.  Hypothyroidism on Synthroid.  History of CVA: holding eliquis . Continue with heparin  infusion, pending surgical intervention.  Chronic kidney disease stage IIIA: creatinine around baseline.  CAD denies any chest pain.  Continue medications.  Disposition: Home with husband.    Subjective: No acute events overnight. Dr Malvin on board. Plan for left second toe amputation to be done tomorrow.   Physical Exam: Vitals:   01/29/24 0630 01/29/24 0645 01/29/24 0700 01/29/24 1000  BP: 111/61 120/72 135/72 120/65  Pulse: 73 99 (!) 103 77  Resp: 17 18 19 20   Temp:    98.1 F (36.7 C)  TempSrc:    Oral  SpO2: 98% 99% 99% 97%   Constitutional: NAD, calm, comfortable Eyes: PERRL, lids and conjunctivae normal ENMT: Mucous membranes are moist. Posterior pharynx clear of any exudate or lesions.Normal dentition.  Neck: normal, supple, no masses, no thyromegaly Respiratory: clear to auscultation bilaterally, no wheezing, no crackles. Normal respiratory effort. No accessory muscle use.  Cardiovascular: Regular rate and rhythm, no murmurs / rubs / gallops. No extremity edema. 2+ pedal pulses. No carotid bruits.  Abdomen: no tenderness, no  masses palpated. No hepatosplenomegaly. Bowel sounds positive.  Musculoskeletal: no clubbing / cyanosis. S/p left big toe amputation Skin: mild erythema of left second toe, dorsal ulceration of PIPJ. Lateral aspect of left fifth  metatarsal ulceration, s/p left big toe amputation Neurologic: CN 2-12 grossly intact. Sensation intact, DTR normal. Strength 5/5 x all 4 extremities.   Data Reviewed:  There are no new results to review at this time.  Family Communication: None at the bedside  Disposition: Status is: Inpatient Remains inpatient appropriate because: osteomyelitis  Planned Discharge Destination: Home with Home Health    Time spent: 40 minutes  Author: Deliliah Room, MD 01/29/2024 12:57 PM  For on call review [email or web address].

## 2024-01-29 NOTE — Consult Note (Signed)
 PODIATRY CONSULTATION  NAME TAMICO MUNDO MRN [id number] DOB Apr 09, 1941 DOA 01/28/2024   Reason for consult: Osteomyelitis left foot  Attending/Consulting physician: PHEBE Room MD  History of present illness: [name] is a 83 y.o. female with with history of COPD, chronic HFrEF last EF measured was 45% on April 2025 atrial fibrillation, CKD stage III, anemia of chronic disease, CAD, prior breast cancer, remote CVA, peripheral artery disease status post multiple angioplasties and stents who was recently admitted to the hospital and underwent left popliteal artery intervention with intravascular lithotripsy and drug-coated balloon angioplasty for in-stent restenosis via femoral access on January 20, 2024 had followed up with podiatrist Dr. Malvin today and patient was found to be having new lesions on the left foot 2nd and 5th toe and in addition patient also was hypoxic at the office with oxygen saturation in 70%.  Was referred to the ER.  Patient states that since her discharge on January 22, 2024 patient has been progressively getting more short of breath.  Denies any chest pain or productive cough.  Has been compliant with her medications.'  Sent from office yesterday due to concern for shortness of breath and possible sepsis.  MRI  obtained and concern for possible osteomyelitis in the left second toe as well as left fifth metatarsal head with history of chronic wounds in the area.  Discussed treatment options as well as recovery.     Past Medical History:  Diagnosis Date   Anemia 05/2014   Atrial fibrillation (HCC)    x 3 yrs   Breast cancer (HCC)    S/P left mastectomy and chemotherapy 1989 remained in remission   Carotid artery occlusion    Carotid bruit    LICA 60-79% (duplex 9/11)   Degenerative arthritis of spine 2015   Chronic back pain   Hyperlipidemia    Hypertension    Meningioma (HCC)    Osteoporosis    Peripheral arterial disease    Pneumonia 05/19/2014    Stroke [facility])    CVA 2008   Subclavian steal syndrome    Ulcers of both lower extremities (HCC) 2015       Latest Ref Rng & Units 01/29/2024    8:01 AM 01/28/2024    4:52 PM 01/22/2024    4:09 AM  CBC  WBC 4.0 - 10.5 K/uL 13.4  15.1  15.5   Hemoglobin 12.0 - 15.0 g/dL 8.6  8.9  9.3   Hematocrit 36.0 - 46.0 % 27.4  28.2  28.9   Platelets 150 - 400 K/uL 340  340  292        Latest Ref Rng & Units 01/29/2024    8:01 AM 01/28/2024    6:50 PM 01/22/2024    4:09 AM  BMP  Glucose 70 - 99 mg/dL 815  81  96   BUN 8 - 23 mg/dL 26  23  40   Creatinine 0.44 - 1.00 mg/dL 8.49  8.54  8.32   Sodium 135 - 145 mmol/L 137  137  139   Potassium 3.5 - 5.1 mmol/L 4.2  4.2  3.9   Chloride 98 - 111 mmol/L 108  104  110   CO2 22 - 32 mmol/L 20  23  22    Calcium 8.9 - 10.3 mg/dL 7.7  8.1  7.8       Physical Exam: Lower Extremity Exam  Palpable DP PT pulses bilateral  Sensation diminished to light touch  Right lateral ankle with chronic  ulceration to the subcutaneous fat tissue level.  Appears stable from prior  Attention to the left second toe there is mild erythema and edema of the distal aspect the second toe.  Dorsal ulceration of the PIPJ.  Chronic ulceration with subcutaneous fat tissue unstageable depth lateral aspect fifth metatarsal head left foot    ASSESSMENT/PLAN OF CARE 83 y.o. female with PMHx significant for  COPD, chronic HFrEF last EF measured was 45% on April 2025 atrial fibrillation, CKD stage III, anemia of chronic disease, CAD, prior breast cancer, remote CVA, peripheral artery disease status post multiple angioplasties and stents  with concern for cellulitis and osteomyelitis left second toe as well as chronic osteomyelitis fifth metatarsal head.  MRI left foot: Marrow edema fifth metatarsal head concern for possible chronic osteomyelitis as well as marrow edema distal aspect second toe distal phalanx.  -Discussed with patient regarding option for ongoing wound care  and monitoring versus more aggressive option to be left second toe amputation and fifth metatarsal head resection partial versus total.  They would prefer to proceed with surgery.  Discussed risk benefits and possible complications. -N.p.o. past midnight for left second toe amputation and left partial versus total fifth metatarsal head resection possible wound closure. - Continue IV abx broad spectrum pending further culture data - Anticoagulation: Okay to continue per primary/vascular - Wound care: None required preoperatively - WB status: Will be weightbearing as tolerated postop shoe following surgery - Will continue to follow   Thank you for the consult.  Please contact me directly with any questions or concerns.           [name], DPM [facility] / [facility]    [address] [place], [place] [address]                Office [phone]  Fax [phone]

## 2024-01-29 NOTE — ED Notes (Signed)
 Floor ready for patient notified patient is coming up

## 2024-01-29 NOTE — Progress Notes (Signed)
 PHARMACY - ANTICOAGULATION CONSULT NOTE  Pharmacy Consult for Heparin  (holding Apixaban ) Indication: atrial fibrillation  Allergies  Allergen Reactions   Iodine  Rash and Other (See Comments)    BETADINE, blistering rash/burns   Iohexol  Rash and Other (See Comments)    Blisters; PT NEEDS 13-HOUR PREP    Petroleum Gauze Non-Woven 3x9 [Wound Dressings] Other (See Comments)    BURNING SENSATION, RED SKIN   Sulfa  Antibiotics Nausea And Vomiting   Wound Dressing Adhesive Other (See Comments)    Burns skin   Tape Itching, Rash and Other (See Comments)    DO NOT USE ADHESIVE TAPE Not even a Band Aid NO PAPER TAPE Burns/tears skin Skin is very very thin   Vital Signs: Temp: 97.4 F (36.3 C) (10/22 1424) Temp Source: Oral (10/22 1424) BP: 131/82 (10/22 1424) Pulse Rate: 99 (10/22 1424)  Labs: Recent Labs    01/28/24 1652 01/28/24 1850 01/29/24 0801 01/29/24 1200  HGB 8.9*  --  8.6*  --   HCT 28.2*  --  27.4*  --   PLT 340  --  340  --   APTT  --   --   --  49*  LABPROT 22.8*  --   --   --   INR 1.9*  --   --   --   HEPARINUNFRC  --   --   --  >1.10*  CREATININE  --  1.45* 1.50*  --   TROPONINIHS 17  --   --   --     Estimated Creatinine Clearance: 16.2 mL/min (A) (by C-G formula based on SCr of 1.5 mg/dL (H)).   Medical History: Past Medical History:  Diagnosis Date   Anemia 05/2014   Atrial fibrillation (HCC)    x 3 yrs   Breast cancer (HCC)    S/P left mastectomy and chemotherapy 1989 remained in remission   Carotid artery occlusion    Carotid bruit    LICA 60-79% (duplex 9/11)   Degenerative arthritis of spine 2015   Chronic back pain   Hyperlipidemia    Hypertension    Meningioma (HCC)    Osteoporosis    Peripheral arterial disease    Pneumonia 05/19/2014   Stroke [facility])    CVA 2008   Subclavian steal syndrome    Ulcers of both lower extremities (HCC) 2015    Assessment: 83 y/o F presents to the ED with shortness of breath and concern for  osteomyelitis. On apixaban  PTA for afib, holding apixaban  and starting heparin . It is has >12 hours since last apixaban  dose, will start heparin  now. Anticipate using aPTT to dose for now.   Heparin  level falsely elevated in setting of recent DOAC intake (HL >1.1). aPTT low at  49, no issues noted. HgB 8.6 downtrending and PLTs stable at 340.   Goal of Therapy:  Heparin  level 0.3-0.7 units/ml aPTT 66-102 seconds Monitor platelets by anticoagulation protocol: Yes   Plan:  Increase heparin  to 700u/hr (~16u/kg > 18u/kg)   aPTT in 8 hours Daily CBC/Heparin  level/aPTT Monitor for bleeding F/u swapping back to Eliquis  after OR for amputation on 10/23.  Powell Blush, PharmD, BCCCP  Clinical Pharmacist Phone: [phone]

## 2024-01-29 NOTE — Progress Notes (Signed)
 PHARMACY - ANTICOAGULATION CONSULT NOTE  Pharmacy Consult for Heparin  (holding Apixaban ) Indication: atrial fibrillation  Allergies  Allergen Reactions   Iodine  Rash and Other (See Comments)    BETADINE, blistering rash/burns   Iohexol  Rash and Other (See Comments)    Blisters; PT NEEDS 13-HOUR PREP    Petroleum Gauze Non-Woven 3x9 [Wound Dressings] Other (See Comments)    BURNING SENSATION, RED SKIN   Sulfa  Antibiotics Nausea And Vomiting   Wound Dressing Adhesive Other (See Comments)    Burns skin   Tape Itching, Rash and Other (See Comments)    DO NOT USE ADHESIVE TAPE Not even a Band Aid NO PAPER TAPE Burns/tears skin Skin is very very thin   Vital Signs: Temp: 97.8 F (36.6 C) (10/22 0149) Temp Source: Axillary (10/22 0149) BP: 131/63 (10/22 0215) Pulse Rate: 84 (10/22 0215)  Labs: Recent Labs    01/28/24 1652 01/28/24 1850  HGB 8.9*  --   HCT 28.2*  --   PLT 340  --   LABPROT 22.8*  --   INR 1.9*  --   CREATININE  --  1.45*  TROPONINIHS 17  --     Estimated Creatinine Clearance: 16.8 mL/min (A) (by C-G formula based on SCr of 1.45 mg/dL (H)).   Medical History: Past Medical History:  Diagnosis Date   Anemia 05/2014   Atrial fibrillation (HCC)    x 3 yrs   Breast cancer (HCC)    S/P left mastectomy and chemotherapy 1989 remained in remission   Carotid artery occlusion    Carotid bruit    LICA 60-79% (duplex 9/11)   Degenerative arthritis of spine 2015   Chronic back pain   Hyperlipidemia    Hypertension    Meningioma (HCC)    Osteoporosis    Peripheral arterial disease    Pneumonia 05/19/2014   Stroke [facility])    CVA 2008   Subclavian steal syndrome    Ulcers of both lower extremities (HCC) 2015    Assessment: 83 y/o F presents to the ED with shortness of breath and concern for osteomyelitis. On apixaban  PTA for afib, holding apixaban  and starting heparin . It is has >12 hours since last apixaban  dose, will start heparin  now. Anticipate  using aPTT to dose for now.   Goal of Therapy:  Heparin  level 0.3-0.7 units/ml aPTT 66-102 seconds Monitor platelets by anticoagulation protocol: Yes   Plan:  Start heparin  drip at 600 units/hr Heparin  level and aPTT in 8 hours Daily CBC/Heparin  level/aPTT Monitor for bleeding  Lynwood Mckusick, PharmD, BCPS Clinical Pharmacist Phone: [phone]

## 2024-01-29 NOTE — ED Notes (Signed)
 RN  assisted pt with using room phone

## 2024-01-29 NOTE — Progress Notes (Signed)
 PHARMACY - ANTICOAGULATION CONSULT NOTE  Pharmacy Consult for Heparin  (holding Apixaban ) Indication: atrial fibrillation  Allergies  Allergen Reactions   Iodine  Rash and Other (See Comments)    BETADINE, blistering rash/burns   Iohexol  Rash and Other (See Comments)    Blisters; PT NEEDS 13-HOUR PREP    Petroleum Gauze Non-Woven 3x9 [Wound Dressings] Other (See Comments)    BURNING SENSATION, RED SKIN   Sulfa  Antibiotics Nausea And Vomiting   Wound Dressing Adhesive Other (See Comments)    Burns skin   Tape Itching, Rash and Other (See Comments)    DO NOT USE ADHESIVE TAPE Not even a Band Aid NO PAPER TAPE Burns/tears skin Skin is very very thin   Vital Signs: Temp: 97.3 F (36.3 C) (10/22 2012) Temp Source: Oral (10/22 2012) BP: 134/58 (10/22 2012) Pulse Rate: 81 (10/22 1450)  Labs: Recent Labs    01/28/24 1652 01/28/24 1850 01/29/24 0801 01/29/24 1200 01/29/24 2319  HGB 8.9*  --  8.6*  --   --   HCT 28.2*  --  27.4*  --   --   PLT 340  --  340  --   --   APTT  --   --   --  49* 59*  LABPROT 22.8*  --   --   --   --   INR 1.9*  --   --   --   --   HEPARINUNFRC  --   --   --  >1.10*  --   CREATININE  --  1.45* 1.50*  --   --   TROPONINIHS 17  --   --   --   --     Estimated Creatinine Clearance: 20.1 mL/min (A) (by C-G formula based on SCr of 1.5 mg/dL (H)).   Medical History: Past Medical History:  Diagnosis Date   Anemia 05/2014   Atrial fibrillation (HCC)    x 3 yrs   Breast cancer (HCC)    S/P left mastectomy and chemotherapy 1989 remained in remission   Carotid artery occlusion    Carotid bruit    LICA 60-79% (duplex 9/11)   Degenerative arthritis of spine 2015   Chronic back pain   Hyperlipidemia    Hypertension    Meningioma (HCC)    Osteoporosis    Peripheral arterial disease    Pneumonia 05/19/2014   Stroke [facility])    CVA 2008   Subclavian steal syndrome    Ulcers of both lower extremities (HCC) 2015    Assessment: 83 y/o F  presents to the ED with shortness of breath and concern for osteomyelitis. On apixaban  PTA for afib, holding apixaban  and starting heparin . It is has >12 hours since last apixaban  dose, will start heparin  now. Anticipate using aPTT to dose for now.   PM: aPTT low at 59, no issues per RN; HgB 8.6 downtrending and PLTs stable at 340.   Goal of Therapy:  Heparin  level 0.3-0.7 units/ml aPTT 66-102 seconds Monitor platelets by anticoagulation protocol: Yes   Plan:  Increase heparin  to 800u/hr  aPTT in 8 hours Daily CBC/Heparin  level/aPTT Monitor for bleeding F/u swapping back to Eliquis  after OR for amputation on 10/23.  Lynwood Poplar, PharmD, BCPS Clinical Pharmacist 01/29/2024 11:56 PM

## 2024-01-29 NOTE — ED Notes (Signed)
 Report given to [facility] rn

## 2024-01-29 NOTE — Plan of Care (Signed)

## 2024-01-29 NOTE — ED Notes (Signed)
 Multiple attempts made to collect urine sample this AM. Diuretic given as per normal meds. Patient once again on bedpan at this time.

## 2024-01-30 ENCOUNTER — Inpatient Hospital Stay (HOSPITAL_COMMUNITY): Admitting: Anesthesiology

## 2024-01-30 ENCOUNTER — Encounter (HOSPITAL_COMMUNITY)
Admission: EM | Disposition: A | Discharge status: Home / Self Care | Payer: Self-pay | Source: Home / Self Care | Attending: Internal Medicine

## 2024-01-30 ENCOUNTER — Other Ambulatory Visit: Payer: Self-pay

## 2024-01-30 ENCOUNTER — Encounter (HOSPITAL_COMMUNITY): Payer: Self-pay | Admitting: Internal Medicine

## 2024-01-30 ENCOUNTER — Inpatient Hospital Stay (HOSPITAL_COMMUNITY)

## 2024-01-30 DIAGNOSIS — J9621 Acute and chronic respiratory failure with hypoxia: Secondary | ICD-10-CM | POA: Diagnosis not present

## 2024-01-30 DIAGNOSIS — Z87891 Personal history of nicotine dependence: Secondary | ICD-10-CM | POA: Diagnosis not present

## 2024-01-30 DIAGNOSIS — I2510 Atherosclerotic heart disease of native coronary artery without angina pectoris: Secondary | ICD-10-CM | POA: Diagnosis not present

## 2024-01-30 DIAGNOSIS — M86672 Other chronic osteomyelitis, left ankle and foot: Secondary | ICD-10-CM | POA: Diagnosis not present

## 2024-01-30 DIAGNOSIS — I4819 Other persistent atrial fibrillation: Secondary | ICD-10-CM | POA: Diagnosis not present

## 2024-01-30 DIAGNOSIS — M869 Osteomyelitis, unspecified: Secondary | ICD-10-CM | POA: Diagnosis not present

## 2024-01-30 DIAGNOSIS — J189 Pneumonia, unspecified organism: Secondary | ICD-10-CM | POA: Diagnosis not present

## 2024-01-30 DIAGNOSIS — I739 Peripheral vascular disease, unspecified: Secondary | ICD-10-CM | POA: Diagnosis not present

## 2024-01-30 DIAGNOSIS — I10 Essential (primary) hypertension: Secondary | ICD-10-CM

## 2024-01-30 HISTORY — PX: AMPUTATION TOE: SHX6595

## 2024-01-30 HISTORY — PX: METATARSAL HEAD EXCISION: SHX5027

## 2024-01-30 LAB — BASIC METABOLIC PANEL WITH GFR
Anion gap: 13 (ref 5–15)
BUN: 31 mg/dL — ABNORMAL HIGH (ref 8–23)
CO2: 21 mmol/L — ABNORMAL LOW (ref 22–32)
Calcium: 7.9 mg/dL — ABNORMAL LOW (ref 8.9–10.3)
Chloride: 105 mmol/L (ref 98–111)
Creatinine, Ser: 1.66 mg/dL — ABNORMAL HIGH (ref 0.44–1.00)
GFR, Estimated: 30 mL/min — ABNORMAL LOW (ref >60)
Glucose, Bld: 112 mg/dL — ABNORMAL HIGH (ref 70–99)
Potassium: 5.3 mmol/L — ABNORMAL HIGH (ref 3.5–5.1)
Sodium: 139 mmol/L (ref 135–145)

## 2024-01-30 LAB — CBC
HCT: 25.9 % — ABNORMAL LOW (ref 36.0–46.0)
Hemoglobin: 8.5 g/dL — ABNORMAL LOW (ref 12.0–15.0)
MCH: 33.2 pg (ref 26.0–34.0)
MCHC: 32.8 g/dL (ref 30.0–36.0)
MCV: 101.2 fL — ABNORMAL HIGH (ref 80.0–100.0)
Platelets: 434 K/uL — ABNORMAL HIGH (ref 150–400)
RBC: 2.56 MIL/uL — ABNORMAL LOW (ref 3.87–5.11)
RDW: 16.1 % — ABNORMAL HIGH (ref 11.5–15.5)
WBC: 29.9 K/uL — ABNORMAL HIGH (ref 4.0–10.5)
nRBC: 0.1 % (ref 0.0–0.2)

## 2024-01-30 LAB — APTT: aPTT: 78 s — ABNORMAL HIGH (ref 24–36)

## 2024-01-30 LAB — HEPARIN LEVEL (UNFRACTIONATED): Heparin Unfractionated: >1.1 [IU]/mL — ABNORMAL HIGH (ref 0.30–0.70)

## 2024-01-30 LAB — SURGICAL PCR SCREEN
MRSA, PCR: NEGATIVE
Staphylococcus aureus: NEGATIVE

## 2024-01-30 SURGERY — AMPUTATION, TOE
Anesthesia: Monitor Anesthesia Care | Site: Toe | Laterality: Left

## 2024-01-30 MED ORDER — SODIUM CHLORIDE 0.9 % IR SOLN: Status: DC | PRN

## 2024-01-30 MED ORDER — ORAL CARE MOUTH RINSE: 15.0000 mL | Freq: Once | OROMUCOSAL | Status: AC

## 2024-01-30 MED ORDER — OXYCODONE HCL 5 MG PO TABS
5.0000 mg | ORAL_TABLET | Freq: Once | ORAL | Status: AC | PRN
  Administered 2024-01-30: 5 mg via ORAL
  Filled 2024-01-30: qty 1

## 2024-01-30 MED ORDER — OXYCODONE HCL 5 MG PO TABS
5.0000 mg | ORAL_TABLET | Freq: Four times a day (QID) | ORAL | Status: DC | PRN
Start: 2024-01-30 — End: 2024-02-01

## 2024-01-30 MED ORDER — NALOXONE HCL 0.4 MG/ML IJ SOLN: 0.4000 mg | INTRAMUSCULAR | Status: DC | PRN

## 2024-01-30 MED ORDER — ONDANSETRON HCL 4 MG/2ML IJ SOLN
INTRAMUSCULAR | Status: DC | PRN
  Administered 2024-01-30: 4 mg via INTRAVENOUS

## 2024-01-30 MED ORDER — TORSEMIDE 20 MG PO TABS
20.0000 mg | ORAL_TABLET | Freq: Every day | ORAL | Status: DC
  Administered 2024-01-31 – 2024-02-01 (×2): 20 mg via ORAL
  Filled 2024-01-30 (×2): qty 1

## 2024-01-30 MED ORDER — CHLORHEXIDINE GLUCONATE 0.12 % MT SOLN: 15.0000 mL | Freq: Once | OROMUCOSAL | Status: AC

## 2024-01-30 MED ORDER — MORPHINE SULFATE (PF) 2 MG/ML IV SOLN
1.0000 mg | INTRAVENOUS | Status: DC | PRN
Start: 2024-01-30 — End: 2024-02-01

## 2024-01-30 MED ORDER — POTASSIUM CHLORIDE CRYS ER 10 MEQ PO TBCR
10.0000 meq | EXTENDED_RELEASE_TABLET | Freq: Two times a day (BID) | ORAL | Status: DC
  Administered 2024-01-31 – 2024-02-01 (×3): 10 meq via ORAL
  Filled 2024-01-30 (×4): qty 1

## 2024-01-30 MED ORDER — FUROSEMIDE 10 MG/ML IJ SOLN
40.0000 mg | Freq: Once | INTRAMUSCULAR | Status: AC
  Administered 2024-01-30: 40 mg via INTRAVENOUS
  Filled 2024-01-30: qty 4

## 2024-01-30 MED ORDER — LINEZOLID 600 MG/300ML IV SOLN
600.0000 mg | Freq: Two times a day (BID) | INTRAVENOUS | Status: DC
  Administered 2024-01-30 – 2024-01-31 (×2): 600 mg via INTRAVENOUS
  Filled 2024-01-30 (×4): qty 300

## 2024-01-30 MED ORDER — UMECLIDINIUM BROMIDE 62.5 MCG/ACT IN AEPB
1.0000 | INHALATION_SPRAY | Freq: Every day | RESPIRATORY_TRACT | Status: DC
  Administered 2024-01-31 – 2024-02-01 (×2): 1 via RESPIRATORY_TRACT
  Filled 2024-01-30 (×2): qty 7

## 2024-01-30 MED ORDER — IPRATROPIUM-ALBUTEROL 0.5-2.5 (3) MG/3ML IN SOLN
3.0000 mL | Freq: Three times a day (TID) | RESPIRATORY_TRACT | Status: DC
  Administered 2024-01-30 – 2024-02-01 (×6): 3 mL via RESPIRATORY_TRACT
  Filled 2024-01-30 (×6): qty 3

## 2024-01-30 MED ORDER — SODIUM CHLORIDE 0.9 % IV SOLN
3.0000 g | Freq: Two times a day (BID) | INTRAVENOUS | Status: DC
  Administered 2024-01-30 – 2024-01-31 (×4): 3 g via INTRAVENOUS
  Filled 2024-01-30 (×4): qty 8

## 2024-01-30 MED ORDER — 0.9 % SODIUM CHLORIDE (POUR BTL) OPTIME
TOPICAL | Status: DC | PRN
  Administered 2024-01-30: 1000 mL

## 2024-01-30 MED ORDER — PROPOFOL 500 MG/50ML IV EMUL
INTRAVENOUS | Status: AC
  Filled 2024-01-30: qty 100

## 2024-01-30 MED ORDER — PROPOFOL 10 MG/ML IV BOLUS
INTRAVENOUS | Status: DC | PRN
  Administered 2024-01-30: 100 ug/kg/min via INTRAVENOUS
  Administered 2024-01-30: 20 mg via INTRAVENOUS

## 2024-01-30 MED ORDER — LIDOCAINE HCL (PF) 1 % IJ SOLN
INTRAMUSCULAR | Status: DC | PRN
  Administered 2024-01-30: 10 mL

## 2024-01-30 MED ORDER — ALUM & MAG HYDROXIDE-SIMETH 200-200-20 MG/5ML PO SUSP
15.0000 mL | Freq: Once | ORAL | Status: AC | PRN
  Administered 2024-01-30: 15 mL via ORAL
  Filled 2024-01-30: qty 30

## 2024-01-30 MED ORDER — PROPOFOL 1000 MG/100ML IV EMUL
INTRAVENOUS | Status: AC
Start: 2024-01-30 — End: 2024-01-30
  Filled 2024-01-30: qty 100

## 2024-01-30 MED ORDER — ARFORMOTEROL TARTRATE 15 MCG/2ML IN NEBU
15.0000 ug | INHALATION_SOLUTION | Freq: Two times a day (BID) | RESPIRATORY_TRACT | Status: DC
  Administered 2024-01-30 – 2024-02-01 (×4): 15 ug via RESPIRATORY_TRACT
  Filled 2024-01-30 (×4): qty 2

## 2024-01-30 MED ORDER — FENTANYL CITRATE (PF) 100 MCG/2ML IJ SOLN
INTRAMUSCULAR | Status: AC
  Filled 2024-01-30: qty 2

## 2024-01-30 MED ORDER — LACTATED RINGERS IV SOLN: INTRAVENOUS | Status: DC

## 2024-01-30 MED ORDER — CHLORHEXIDINE GLUCONATE 0.12 % MT SOLN
OROMUCOSAL | Status: AC
  Administered 2024-01-30: 15 mL via OROMUCOSAL
  Filled 2024-01-30: qty 15

## 2024-01-30 MED ORDER — FENTANYL CITRATE (PF) 100 MCG/2ML IJ SOLN: 25.0000 ug | INTRAMUSCULAR | Status: DC | PRN

## 2024-01-30 MED ORDER — LIDOCAINE 5 % EX PTCH
1.0000 | MEDICATED_PATCH | CUTANEOUS | Status: DC
  Administered 2024-01-30: 1 via TRANSDERMAL
  Filled 2024-01-30 (×2): qty 1

## 2024-01-30 MED ORDER — BUPIVACAINE HCL (PF) 0.5 % IJ SOLN
INTRAMUSCULAR | Status: DC | PRN
  Administered 2024-01-30: 10 mL

## 2024-01-30 MED ORDER — FENTANYL CITRATE (PF) 100 MCG/2ML IJ SOLN
INTRAMUSCULAR | Status: DC | PRN
  Administered 2024-01-30: 25 ug via INTRAVENOUS
  Administered 2024-01-30: 50 ug via INTRAVENOUS
  Administered 2024-01-30: 25 ug via INTRAVENOUS

## 2024-01-30 MED ORDER — PHENYLEPHRINE 80 MCG/ML (10ML) SYRINGE FOR IV PUSH (FOR BLOOD PRESSURE SUPPORT)
PREFILLED_SYRINGE | INTRAVENOUS | Status: DC | PRN
  Administered 2024-01-30: 80 ug via INTRAVENOUS
  Administered 2024-01-30: 120 ug via INTRAVENOUS

## 2024-01-30 SURGICAL SUPPLY — 36 items
BLADE AVERAGE 25X9 (BLADE) IMPLANT
BLADE SURG 10 STRL SS (BLADE) ×1 IMPLANT
BLADE SURG 15 STRL LF DISP TIS (BLADE) ×1 IMPLANT
BNDG COHESIVE 3X5 TAN ST LF (GAUZE/BANDAGES/DRESSINGS) ×1 IMPLANT
BNDG COMPR ESMARK 4X3 LF (GAUZE/BANDAGES/DRESSINGS) ×1 IMPLANT
BNDG ELASTIC 3INX 5YD STR LF (GAUZE/BANDAGES/DRESSINGS) ×1 IMPLANT
BNDG ELASTIC 4INX 5YD STR LF (GAUZE/BANDAGES/DRESSINGS) IMPLANT
BNDG GAUZE DERMACEA FLUFF 4 (GAUZE/BANDAGES/DRESSINGS) IMPLANT
CHLORAPREP W/TINT 26 (MISCELLANEOUS) IMPLANT
DRSG ADAPTIC 3X8 NADH LF (GAUZE/BANDAGES/DRESSINGS) IMPLANT
DRSG XEROFORM 1X8 (GAUZE/BANDAGES/DRESSINGS) IMPLANT
ELECTRODE REM PT RTRN 9FT ADLT (ELECTROSURGICAL) ×1 IMPLANT
GAUZE PAD ABD 8X10 STRL (GAUZE/BANDAGES/DRESSINGS) IMPLANT
GAUZE SPONGE 2X2 STRL 8-PLY (GAUZE/BANDAGES/DRESSINGS) IMPLANT
GAUZE SPONGE 4X4 12PLY STRL (GAUZE/BANDAGES/DRESSINGS) ×1 IMPLANT
GAUZE STRETCH 2X75IN STRL (MISCELLANEOUS) ×1 IMPLANT
GAUZE XEROFORM 1X8 LF (GAUZE/BANDAGES/DRESSINGS) ×1 IMPLANT
GLOVE BIO SURGEON STRL SZ7.5 (GLOVE) ×1 IMPLANT
GLOVE BIOGEL PI IND STRL 7.5 (GLOVE) ×1 IMPLANT
GOWN STRL REUS W/ TWL LRG LVL3 (GOWN DISPOSABLE) ×2 IMPLANT
KIT BASIN OR (CUSTOM PROCEDURE TRAY) ×1 IMPLANT
NDL HYPO 25X1 1.5 SAFETY (NEEDLE) ×1 IMPLANT
NEEDLE HYPO 25X1 1.5 SAFETY (NEEDLE) ×1 IMPLANT
PACK ORTHO EXTREMITY (CUSTOM PROCEDURE TRAY) ×1 IMPLANT
SET HNDPC FAN SPRY TIP SCT (DISPOSABLE) IMPLANT
SOLN STERILE WATER BTL 1000 ML (IV SOLUTION) ×1 IMPLANT
SPIKE FLUID TRANSFER (MISCELLANEOUS) IMPLANT
STOCKINETTE 4X48 STRL (DRAPES) IMPLANT
SUT ETHILON 3 0 FSLX (SUTURE) IMPLANT
SUT PROLENE 3 0 PS 1 (SUTURE) IMPLANT
SUT PROLENE 3 0 PS 2 (SUTURE) IMPLANT
SUT PROLENE 4 0 PS 2 18 (SUTURE) IMPLANT
SYR CONTROL 10ML LL (SYRINGE) ×1 IMPLANT
TUBE CONNECTING 12X1/4 (SUCTIONS) IMPLANT
UNDERPAD 30X36 HEAVY ABSORB (UNDERPADS AND DIAPERS) ×1 IMPLANT
YANKAUER SUCT BULB TIP NO VENT (SUCTIONS) IMPLANT

## 2024-01-30 NOTE — Progress Notes (Signed)
 History and Physical Interval Note:  01/30/2024 12:30 PM  [name]  has presented today for surgery, with the diagnosis of osteomyelitis left 2nd toe and 5th metatarsal head.  The various methods of treatment have been discussed with the patient and family. After consideration of risks, benefits and other options for treatment, the patient has consented to   Procedure(s) with comments: AMPUTATION, TOE (Left) - L 2nd toe amputation EXCISION, METATARSAL BONE, HEAD (Left) - L 5th met head resection as a surgical intervention.  The patient's history has been reviewed, patient examined, no change in status, stable for surgery.  I have reviewed the patient's chart and labs.  Questions were answered to the patient's satisfaction.     Marsa FALCON [name]

## 2024-01-30 NOTE — Progress Notes (Signed)
 Orthopedic Tech Progress Note Patient Details:  [name] 1940/08/16 [id number]  Ortho Devices Type of Ortho Device: Postop shoe/boot Ortho Device/Splint Location: tubed to 5W Ortho Device/Splint Interventions: Ordered      Tinnie Ronal Brasil 01/30/2024, 4:34 PM

## 2024-01-30 NOTE — Transfer of Care (Signed)
 Immediate Anesthesia Transfer of Care Note  Patient: [name]  Procedure(s) Performed: AMPUTATION LEFT SECOND TOE (Left: Toe) EXCISION LEFT FIFTH METATARSAL HEAD (Left: Toe)  Patient Location: PACU  Anesthesia Type:MAC  Level of Consciousness: drowsy and responds to stimulation  Airway & Oxygen Therapy: Patient Spontanous Breathing  Post-op Assessment: Report given to RN  Post vital signs: Reviewed and stable  Last Vitals:  Vitals Value Taken Time  BP 110/70 01/30/24 13:45  Temp    Pulse 93 01/30/24 13:45  Resp 13 01/30/24 13:45  SpO2 95 % 01/30/24 13:45  Vitals shown include unfiled device data.  Last Pain:  Vitals:   01/30/24 1205  TempSrc: Oral  PainSc: 0-No pain         Complications: No notable events documented.

## 2024-01-30 NOTE — Anesthesia Preprocedure Evaluation (Addendum)
 Anesthesia Evaluation  Patient identified by MRN, date of birth, ID band Patient awake    Reviewed: Allergy & Precautions, H&P , NPO status , Patient's Chart, lab work & pertinent test results, reviewed documented beta blocker date and time   Airway Mallampati: II  TM Distance: >3 FB Neck ROM: Full    Dental no notable dental hx. (+) Poor Dentition, Dental Advisory Given   Pulmonary COPD, former smoker   Pulmonary exam normal breath sounds clear to auscultation       Cardiovascular hypertension, Pt. on medications and Pt. on home beta blockers + CAD, + Peripheral Vascular Disease and +CHF  + Valvular Problems/Murmurs  Rhythm:Regular Rate:Normal     Neuro/Psych CVA  negative psych ROS   GI/Hepatic Neg liver ROS, PUD,GERD  Medicated,,  Endo/Other  Hypothyroidism    Renal/GU Renal InsufficiencyRenal disease  negative genitourinary   Musculoskeletal  (+) Arthritis , Osteoarthritis,    Abdominal   Peds  Hematology  (+) Blood dyscrasia, anemia   Anesthesia Other Findings   Reproductive/Obstetrics negative OB ROS                              Anesthesia Physical Anesthesia Plan  ASA: 3  Anesthesia Plan: MAC   Post-op Pain Management: Tylenol  PO (pre-op)*   Induction: Intravenous  PONV Risk Score and Plan: 3 and Ondansetron , Propofol  infusion and Treatment may vary due to age or medical condition  Airway Management Planned: Natural Airway and Simple Face Mask  Additional Equipment:   Intra-op Plan:   Post-operative Plan:   Informed Consent: I have reviewed the patients History and Physical, chart, labs and discussed the procedure including the risks, benefits and alternatives for the proposed anesthesia with the patient or authorized representative who has indicated his/her understanding and acceptance.   Patient has DNR.  Discussed DNR with patient and Continue DNR.   Dental  advisory given  Plan Discussed with: CRNA  Anesthesia Plan Comments:          Anesthesia Quick Evaluation

## 2024-01-30 NOTE — Progress Notes (Signed)
 PROGRESS NOTE        PATIENT DETAILS Name: [name] Age: 83 y.o. Sex: female Date of Birth: 24-Aug-1940 Admit Date: 01/28/2024 Admitting Physician Redia LOISE Cleaver, MD ERE:Yjoo, Norleen PEDLAR, MD  Brief Summary: Patient is a 83 y.o.  female with history of chronic HFrEF, COPD, A-fib, CKD stage IIIa, PAD-who was sent from podiatry office for evaluation of shortness of breath and new finding of osteomyelitis left second toe and left fifth metatarsal head.  Significant events: 10/11-10/15>> hospitalization-left leg pain-evaluated by vascular surgery-underwent popliteal artery intervention on 10/13  Significant studies: 10/21>> CXR: Emphysema-possible pneumonia right upper lung. 10/21>> MRI left foot: Marrow edema with possible osteomyelitis of fifth metatarsal head, possible osteomyelitis of distal second toe  Significant microbiology data: 10/21>> COVID/influenza/RSV PCR: Negative 10/21>> blood culture: No growth  Procedures: None none  Consults: None  Subjective: Lying comfortably in bed-denies any chest pain or shortness of breath.  Currently on room air.  Objective: Vitals: Blood pressure (!) 160/84, pulse 92, temperature 97.9 F (36.6 C), temperature source Axillary, resp. rate 19, height 5' 3 (1.6 m), weight 46.5 kg, SpO2 96%.   Exam: Gen Exam:Alert awake-not in any distress HEENT:atraumatic, normocephalic Chest: B/L clear to auscultation anteriorly CVS:S1S2 regular Abdomen:soft non tender, non distended Extremities:no edema Neurology: Non focal Skin: no rash  Pertinent Labs/Radiology:    Latest Ref Rng & Units 01/30/2024    7:57 AM 01/29/2024    8:01 AM 01/28/2024    4:52 PM  CBC  WBC 4.0 - 10.5 K/uL 29.9  13.4  15.1   Hemoglobin 12.0 - 15.0 g/dL 8.5  8.6  8.9   Hematocrit 36.0 - 46.0 % 25.9  27.4  28.2   Platelets 150 - 400 K/uL 434  340  340     Lab Results  Component Value Date   NA 139 01/30/2024   K 5.3 (H) 01/30/2024    CL 105 01/30/2024   CO2 21 (L) 01/30/2024      Assessment/Plan: Left 2nd and 5th toe acute osteomyelitis Appears nontoxic-stable-afebrile-worsening leukocytosis but patient received Solu-Medrol  yesterday Switch vancomycin  to Zyvox  Switch cefepime  to Unasyn Podiatry planning toe amputation later today.  ?  Right upper lobe PNA Apparently hypoxic at podiatry's office yesterday but without any other symptoms Cultures negative so far See above regarding antibiotics  Acute hypoxic respiratory failure (hypoxic at podiatry's office to the 70s) Seems to have rapidly improved-with bronchodilators/diuretics-currently on room air Suspect this was probably more related to HFrEF-as patient was not taking her diuretics for the past several days Currently volume status stable-but will give 1 dose of IV Lasix  as she is going to the OR today Resume Demadex  starting tomorrow. Note-although she apparently has home O2-she is not using it.  Acute on chronic HFrEF Probably improved-see above  COPD Currently stable-no wheezing-doubt exacerbation Continue bronchodilators  PAF Rate controlled Continue amiodarone /metoprolol  Eliquis  on hold-currently on IV heparin   PAD-s/p balloon angioplasty of in-stent restenosis of left popliteal artery 10/13 Plavix  held for amputation scheduled for later today-resume when able Continue statin  CKD stage IIIa Close to baseline  Hyperkalemia Mild Getting IV Lasix  today Recheck left lites tomorrow  History of breast cancer-s/p B/L mastectomy/chemo/radiation Outpatient follow-up with oncology.  Code status:   Code Status: Limited: Do not attempt resuscitation (DNR) -DNR-LIMITED -Do Not Intubate/DNI    DVT Prophylaxis: IV heparin   Family Communication: None at bedside   Disposition Plan: Status is: Inpatient Remains inpatient appropriate because: Severity of illness   Planned Discharge Destination:Home health   Diet: Diet Order              Diet NPO time specified Except for: Sips with Meds  Diet effective midnight                     Antimicrobial agents: Anti-infectives (From admission, onward)    Start     Dose/Rate Route Frequency Ordered Stop   01/30/24 1800  vancomycin  (VANCOREADY) IVPB 500 mg/100 mL        500 mg 100 mL/hr over 60 Minutes Intravenous Every 48 hours 01/29/24 0002     01/29/24 1800  ceFEPIme  (MAXIPIME ) 2 g in sodium chloride  0.9 % 100 mL IVPB        2 g 200 mL/hr over 30 Minutes Intravenous Every 24 hours 01/28/24 2358     01/29/24 0000  doxycycline  (VIBRAMYCIN ) 100 mg in sodium chloride  0.9 % 250 mL IVPB        100 mg 125 mL/hr over 120 Minutes Intravenous 2 times daily 01/28/24 2350     01/28/24 2030  ceFEPIme  (MAXIPIME ) 2 g in sodium chloride  0.9 % 100 mL IVPB        2 g 200 mL/hr over 30 Minutes Intravenous  Once 01/28/24 2019 01/28/24 2342   01/28/24 2015  vancomycin  (VANCOREADY) IVPB 750 mg/150 mL        20 mg/kg  37.5 kg 150 mL/hr over 60 Minutes Intravenous  Once 01/28/24 2005 01/29/24 0042        MEDICATIONS: Scheduled Meds:  amiodarone   200 mg Oral q morning   budesonide (PULMICORT) nebulizer solution  0.25 mg Nebulization BID   ipratropium-albuterol   3 mL Nebulization Q4H   levothyroxine  75 mcg Oral Q0600   lidocaine   1 patch Transdermal Q24H   metoprolol  tartrate  75 mg Oral BID   mupirocin  ointment  1 Application Nasal BID   pantoprazole   40 mg Oral Daily   [START ON 01/31/2024] potassium chloride   10 mEq Oral BID   pravastatin   40 mg Oral Daily   [START ON 01/31/2024] torsemide   20 mg Oral Daily   Continuous Infusions:  ceFEPime  (MAXIPIME ) IV 2 g (01/29/24 1807)   doxycycline  (VIBRAMYCIN ) IV 100 mg (01/29/24 2228)   heparin  800 Units/hr (01/30/24 0001)   vancomycin      PRN Meds:.acetaminophen  **OR** acetaminophen , ipratropium-albuterol , morphine  injection, naLOXone (NARCAN)  injection, oxyCODONE    I have personally reviewed following labs and imaging  studies  LABORATORY DATA: CBC: Recent Labs  Lab 01/28/24 1652 01/29/24 0801 01/30/24 0757  WBC 15.1* 13.4* 29.9*  NEUTROABS 12.8*  --   --   HGB 8.9* 8.6* 8.5*  HCT 28.2* 27.4* 25.9*  MCV 103.3* 103.8* 101.2*  PLT 340 340 434*    Basic Metabolic Panel: Recent Labs  Lab 01/28/24 1850 01/29/24 0801 01/30/24 0757  NA 137 137 139  K 4.2 4.2 5.3*  CL 104 108 105  CO2 23 20* 21*  GLUCOSE 81 184* 112*  BUN 23 26* 31*  CREATININE 1.45* 1.50* 1.66*  CALCIUM 8.1* 7.7* 7.9*    GFR: Estimated Creatinine Clearance: 18.2 mL/min (A) (by C-G formula based on SCr of 1.66 mg/dL (H)).  Liver Function Tests: Recent Labs  Lab 01/28/24 1850 01/29/24 0801  AST 43* 38  ALT 41 39  ALKPHOS 109 102  BILITOT 1.1 0.7  PROT 6.4* 6.4*  ALBUMIN  2.2* 2.2*   No results for input(s): LIPASE, AMYLASE in the last 168 hours. No results for input(s): AMMONIA in the last 168 hours.  Coagulation Profile: Recent Labs  Lab 01/28/24 1652  INR 1.9*    Cardiac Enzymes: No results for input(s): CKTOTAL, CKMB, CKMBINDEX, TROPONINI in the last 168 hours.  BNP (last 3 results) No results for input(s): PROBNP in the last 8760 hours.  Lipid Profile: No results for input(s): CHOL, HDL, LDLCALC, TRIG, CHOLHDL, LDLDIRECT in the last 72 hours.  Thyroid  Function Tests: No results for input(s): TSH, T4TOTAL, FREET4, T3FREE, THYROIDAB in the last 72 hours.  Anemia Panel: No results for input(s): VITAMINB12, FOLATE, FERRITIN, TIBC, IRON, RETICCTPCT in the last 72 hours.  Urine analysis:    Component Value Date/Time   COLORURINE YELLOW 01/29/2024 1228   APPEARANCEUR CLEAR 01/29/2024 1228   LABSPEC 1.016 01/29/2024 1228   PHURINE 5.0 01/29/2024 1228   GLUCOSEU NEGATIVE 01/29/2024 1228   HGBUR NEGATIVE 01/29/2024 1228   BILIRUBINUR NEGATIVE 01/29/2024 1228   KETONESUR NEGATIVE 01/29/2024 1228   PROTEINUR 30 (A) 01/29/2024 1228   UROBILINOGEN 0.2  02/15/2015 0417   NITRITE NEGATIVE 01/29/2024 1228   LEUKOCYTESUR NEGATIVE 01/29/2024 1228    Sepsis Labs: Lactic Acid, Venous    Component Value Date/Time   LATICACIDVEN 1.5 01/28/2024 1754    MICROBIOLOGY: Recent Results (from the past 240 hours)  Blood Culture (routine x 2)     Status: None (Preliminary result)   Collection Time: 01/28/24  5:32 PM   Specimen: BLOOD  Result Value Ref Range Status   Specimen Description BLOOD SITE NOT SPECIFIED  Final   Special Requests   Final    BOTTLES DRAWN AEROBIC AND ANAEROBIC Blood Culture results may not be optimal due to an inadequate volume of blood received in culture bottles   Culture   Final    NO GROWTH 2 DAYS Performed at [facility], [address] [place], [place] [address]    Report Status PENDING  Incomplete  Resp panel by RT-PCR (RSV, Flu A&B, Covid) Anterior Nasal Swab     Status: None   Collection Time: 01/28/24  6:36 PM   Specimen: Anterior Nasal Swab  Result Value Ref Range Status   SARS Coronavirus 2 by RT PCR NEGATIVE NEGATIVE Final   Influenza A by PCR NEGATIVE NEGATIVE Final   Influenza B by PCR NEGATIVE NEGATIVE Final    Comment: (NOTE) The Xpert Xpress SARS-CoV-2/FLU/RSV plus assay is intended as an aid in the diagnosis of influenza from Nasopharyngeal swab specimens and should not be used as a sole basis for treatment. Nasal washings and aspirates are unacceptable for Xpert Xpress SARS-CoV-2/FLU/RSV testing.  Fact Sheet for Patients: BloggerCourse.com  Fact Sheet for Healthcare Providers: SeriousBroker.it  This test is not yet approved or cleared by the [place]  FDA and has been authorized for detection and/or diagnosis of SARS-CoV-2 by FDA under an Emergency Use Authorization (EUA). This EUA will remain in effect (meaning this test can be used) for the duration of the COVID-19 declaration under Section 564(b)(1) of the Act, 21  U.S.C. section 360bbb-3(b)(1), unless the authorization is terminated or revoked.     Resp Syncytial Virus by PCR NEGATIVE NEGATIVE Final    Comment: (NOTE) Fact Sheet for Patients: BloggerCourse.com  Fact Sheet for Healthcare Providers: SeriousBroker.it  This test is not yet approved or cleared by the [place]  FDA and has been authorized for detection and/or diagnosis of  SARS-CoV-2 by FDA under an Emergency Use Authorization (EUA). This EUA will remain in effect (meaning this test can be used) for the duration of the COVID-19 declaration under Section 564(b)(1) of the Act, 21 U.S.C. section 360bbb-3(b)(1), unless the authorization is terminated or revoked.  Performed at [facility], [address] [place], [place] [address]   Blood Culture (routine x 2)     Status: None (Preliminary result)   Collection Time: 01/28/24  9:52 PM   Specimen: BLOOD RIGHT HAND  Result Value Ref Range Status   Specimen Description BLOOD RIGHT HAND  Final   Special Requests   Final    BOTTLES DRAWN AEROBIC AND ANAEROBIC Blood Culture adequate volume   Culture   Final    NO GROWTH 2 DAYS Performed at [facility], [address] [place], [place] [address]    Report Status PENDING  Incomplete  Surgical PCR screen     Status: None   Collection Time: 01/29/24 10:30 PM   Specimen: Nasal Mucosa; Nasal Swab  Result Value Ref Range Status   MRSA, PCR NEGATIVE NEGATIVE Final   Staphylococcus aureus NEGATIVE NEGATIVE Final    Comment: (NOTE) The Xpert SA Assay (FDA approved for NASAL specimens in patients 23 years of age and older), is one component of a comprehensive surveillance program. It is not intended to diagnose infection nor to guide or monitor treatment. Performed at [facility], [address] [place], [place] [address]     RADIOLOGY STUDIES/RESULTS: MR FOOT LEFT WO CONTRAST Result Date: 01/29/2024 EXAM: MRI  of the left Foot without contrast. 01/28/2024 10:38:13 PM TECHNIQUE: Multiplanar multisequence MRI of the left foot was performed without the administration of intravenous contrast. COMPARISON: Left foot radiographs 01/28/2024. CLINICAL HISTORY: Concern also for possible osteomyelitis of the toe. FINDINGS: BONE MARROW: Status post amputation of the first ray to the level of the first metatarsal mid shaft. The transection margin appears intact. Second through fifth digit hammertoes. Evaluation of the distal phalanges is limited due to slice selection and field inhomogeneity. Sagittal STIR sequence demonstrates increased signal of the tuft of the second distal phalanx and the third distal phalanx. Curvilinear low signal of the third and fourth metatarsal head and neck junctions with corresponding increased linear T2 signal, suspicious for subacute versus chronic nondisplaced fractures. Marrow edema of the fifth metatarsal head most pronounced laterally without significant corresponding confluent T1 hypointensity. There is a region of subtle curvilinear low signal intensity noted medially at the fifth metatarsal head. These findings could reflect subacute versus chronic nondisplaced fracture of the fifth metatarsal base versus reactive marrow edema Osteomyelitis is felt less likely, however, cannot be excluded. Mild flattening of the second metatarsal head without appreciable marrow signal abnormality. Mild degenerative arthropathy of the midfoot and forefoot. No joint effusion. No dislocation. LISFRANC JOINT: Visualized Lisfranc ligament is intact. TENDONS: No significant tenosynovitis. SOFT TISSUES: Atrophy of the intrinsic musculature of the foot likely reflects chronic denervation changes. Cutaneous irregularity/ulceration along the lateral aspect of the fifth metatarsal head. Cutaneous irregularity of the distal second toe. No loculated fluid collection. Subcutaneous edema along the dorsal forefoot. IMPRESSION: 1.  Marrow edema of the fifth metatarsal head without significant corresponding confluent T1 hypointensity. There is a region of subtle curvilinear low signal intensity noted medially at the fifth metatarsal head. These findings could reflect subacute versus chronic nondisplaced fracture of the fifth metatarsal head versus reactive marrow edema. Osteomyelitis is felt less likely, however, cannot be excluded  given cutaneous irregularity/ulceration overlying the lateral fifth metatarsal head. 2. Limited evaluation of the distal phalanges. Increased STIR signal intensity of the tuft of the second distal phalanx with cutaneous irregularity of the distal second toe and increased STIR signal intensity of the third distal phalanx. Osteomyelitis cannot be excluded. 3. Findings suspicious for subacute versus chronic nondisplaced fractures of the third and fourth metatarsal head and neck junctions. 4. Status post first ray amputation to the mid shaft of the first metatarsal. 5. No loculated fluid collection. 6. Subcutaneous edema along the dorsal forefoot. Electronically signed by: Harrietta Sherry MD 01/29/2024 09:06 AM EDT RP Workstation: [id number]   DG Chest Port 1 View Result Date: 01/28/2024 CLINICAL DATA:  Questionable sepsis - evaluate for abnormality EXAM: PORTABLE CHEST - 1 VIEW COMPARISON:  07/24/2023 FINDINGS: Emphysema. Biapical pleural thickening. Hazy airspace opacities in the right upper lung zone. Mild cardiomegaly. Tortuous aorta with aortic atherosclerosis. No acute fracture or destructive lesions. Multilevel thoracic osteophytosis. Diffuse osteopenia. Surgical clips in the superior left breast. IMPRESSION: Emphysema. Hazy airspace opacities in the right upper lung zone, worrisome for a developing bronchopneumonia. Electronically Signed   By: Rogelia Myers M.D.   On: 01/28/2024 19:16   DG Foot Complete Left Result Date: 01/28/2024 Please see detailed radiograph report in office note.    LOS: 2 days    Donalda Applebaum, MD  [facility]    To contact the attending provider between 7A-7P or the covering provider during after hours 7P-7A, please log into the web site [email or web address] and access using universal [place] password for that web site. If you do not have the password, please call the hospital operator.  01/30/2024, 9:03 AM

## 2024-01-30 NOTE — Progress Notes (Signed)
 Dr. Malvin contacted, pt's heparin  drip will need to be turned off ON CALL to OR. Nursing communication order placed.

## 2024-01-30 NOTE — TOC CM/SW Note (Addendum)
 Transition of Care [facility]) - Inpatient Brief Assessment   Patient Details  Name: [name] MRN: [id number] Date of Birth: [birth year]  Transition of Care [facility]) CM/SW Contact:    Tom-Johnson, Harvest Muskrat, RN Phone Number: 01/30/2024, 11:58 AM   Clinical Narrative:  Patient presented to the ED from her Podiatrist with Shortness of Breath, Oxygen saturation in the 70% and new Lt Foot Lesions on 2nd and 5th Toe. Patient was recently admitted to the hospital and underwent Lt Popliteal Artery intervention with Intravascular Lithotripsy and Angioplasty for in-stent restenosis on 01/20/24. Patient was discharged home on 01/22/24 and f/u with Podiatry.  Chest X-ray concerning for Pneumonia, on IV abx and Heparin  gtt.    Podiatry following, plan for Lt 2nd Toe Amputation and 5th Metatarsal Head Resection, partial v/s total today 01/30/24.    CM spoke with patient at bedside. Patient is from home with her husband, has two supportive children who lives out of state. Modified independent, has all necessary DME's at home. Has home O2 from [employer]. Husband drives to and from appointments.  PCP is [name], MD and uses [facility] and [facility] on [address] in [place].   Home health recommended, patient states she is active with [facility]. CM called in resumption of care referral and Jon voiced acceptance, info on AVS. Patient not Medically ready for discharge.  CM will continue to follow as patient progresses with care towards discharge.            Transition of Care Asessment: Insurance and Status: Insurance coverage has been reviewed Patient has primary care physician: Yes Home environment has been reviewed: Yes Prior level of function:: Modified Independent Prior/Current Home Services: Current home services (HH with [facility]) Social Drivers of Health Review: SDOH reviewed no interventions necessary Readmission risk has been  reviewed: Yes Transition of care needs: transition of care needs identified, TOC will continue to follow

## 2024-01-30 NOTE — Op Note (Signed)
 Full Operative Report  Date of Operation: 12:52 PM, 01/30/2024   Patient: [name] - 83 y.o. female  Surgeon: Malvin Marsa FALCON, DPM   Assistant: None  Diagnosis: osteomyelitis 2nd toe and 5th metatarsal head left foot  Procedure:  1.  Amputation second toe at MPJ level, left foot 2. Fifth metatarsal head resection, left foot    Anesthesia: Monitor Anesthesia Care  No responsible provider has been recorded for the case.  Anesthesiologist: Epifanio Fallow, MD CRNA: Delores Duwaine JONELLE, CRNA   Estimated Blood Loss: Minimal   Hemostasis: 1) Anatomical dissection, mechanical compression, electrocautery 2) no tourniquet was used during procedure  Implants: * No implants in log *  Materials: Prolene 3-0  Injectables: 1) Pre-operatively: 20 cc of 50:50 mixture 1%lidocaine  plain and 0.5% marcaine  plain 2) Post-operatively: None   Specimens: - Pathology: Left second toe and separately fifth metatarsal head - Microbiology: Bone culture fifth metatarsal head   Antibiotics: IV antibiotics given per schedule on the floor  Drains: None  Complications: Patient tolerated the procedure well without complication.   Operative findings: As below in detailed report  Indications for Procedure: [name] presents to Malvin Marsa FALCON, DPM with a chief complaint of erythema and edema with ulceration left second toe as well as lateral aspect fifth metatarsal head with concern for underlying osteomyelitis of the distal phalanx second toe as well as the fifth metatarsal head.  The patient has failed conservative treatments of various modalities. At this time the patient has elected to proceed with surgical correction. All alternatives, risks, and complications of the procedures were thoroughly explained to the patient. Patient exhibits appropriate understanding of all discussion points and informed consent was signed and obtained in the chart with no guarantees to surgical  outcome given or implied.  Description of Procedure: Patient was brought to the operating room. Patient remained on their hospital bed in the supine position. A surgical timeout was performed and all members of the operating room, the procedure, and the surgical site were identified. anesthesia occurred as per anesthesia record. Local anesthetic as previously described was then injected about the operative field in a local infiltrative block.  The operative lower extremity as noted above was then prepped and draped in the usual sterile manner. The following procedure then began.  Attention was directed to the second digit on the left foot. A full-thickness incision encompassing the entire digit was made using a #15 blade. Dissection was carried down to bone. The toe was secured with a towel clamp, further dissected in its entirety, and disarticulated at the MPJ and passed to the back table as a gross specimen. This was then labled and sent to pathology. The bone was noted to be soft and eroded, and consistent with osteomyelitis. All remaining necrotic and devitalized soft tissue structures were visualized and dissected away using sharp and dull dissection. Care was taken to protect all neurovascular structures throughout the dissection. All bleeders were cauterized as necessary.. The area was then flushed with copious amounts of sterile saline. Then using the suture materials previously described, the site was closed in anatomic layers and the skin was well approximated under minimal tension.  Attention was then directed to the ulceration at the lateral aspect of the fifth metatarsal head on the left foot.  The wound was excised in elliptical fashion with extension distal and proximal.  The fifth metatarsal head was exposed.  Using a sagittal saw a transverse osteotomy was made at the fifth metatarsal neck  area.  The fifth metatarsal head was then resected in entirety.  This was sent for pathology.  The fifth  metatarsal head was noted to be soft and concern for necrosis. A bone culture was harvested from the fifth metatarsal head with rongeur and sent for micro.  Wound was then further debrided of any necrotic or fibrotic tissues.  It was irrigated thoroughly with sterile saline.  Wound was then closed with 3-0 prolene.  The surgical site was then dressed with Xeroform 4 x 4 gauze Kerlix Ace wrap. The patient tolerated both the procedure and anesthesia well with vital signs stable throughout. The patient was transferred in good condition and all vital signs stable  from the OR to recovery under the discretion of anesthesia.  Condition: Vital signs stable, neurovascular status unchanged from preoperative   Surgical plan:  Expect clean margin at the second toe amputation site as well as the fifth metatarsal head.  Weightbearing as tolerated in postop shoe.  Will be stable for discharge home from my standpoint likely tomorrow.  Leave surgical dressings clean dry and intact.  Recommend 7 days of Augmentin  on discharge.  The patient will be weightbearing as tolerated in a postop shoe to the operative limb until further instructed. The dressing is to remain clean, dry, and intact. Will continue to follow unless noted elsewhere.   Marsa Honour, DPM [facility]

## 2024-01-30 NOTE — Evaluation (Signed)
 Physical Therapy Evaluation Patient Details Name: [name] MRN: [id number] DOB: 02-Nov-1940 Today's Date: 01/30/2024  History of Present Illness  Pt is an 83 y/o F  sent from podiatry office for evaluation of shortness of breath and new finding of osteomyelitis left second toe and left fifth metatarsal head. PMH includes chronic HFrEF, COPD, A-fib, CKD stage IIIa, PAD  Clinical Impression  Received pt semi-reclined in bed with husband at bedside. Pt agreeable to PT evaluation with plan to go to OR today for amputation of L toe(s). Pt lives with husband in 1 level home with 4 STE and 2 handrail and husband available to provide assist as needed. Pt reports limited standing tolerance and pushes transport chair around home to walk. Pt performed bed mobility with supervision using bed features, then transport arrived to take pt to OR. Pt/husband requesting HHPT services and anticipate pt would benefit from HHPT to address current strength, balance, and endurance deficits. Acute PT to cont to follow.       If plan is discharge home, recommend the following: A little help with walking and/or transfers;A little help with bathing/dressing/bathroom;Direct supervision/assist for medications management;Direct supervision/assist for financial management;Assist for transportation;Help with stairs or ramp for entrance   Can travel by private vehicle        Equipment Recommendations None recommended by PT  Recommendations for Other Services       Functional Status Assessment Patient has had a recent decline in their functional status and demonstrates the ability to make significant improvements in function in a reasonable and predictable amount of time.     Precautions / Restrictions Precautions Precautions: Fall      Mobility  Bed Mobility Overal bed mobility: Needs Assistance Bed Mobility: Rolling, Supine to Sit, Sit to Supine Rolling: Supervision   Supine to sit: Supervision, HOB elevated,  Used rails Sit to supine: Supervision, Used rails, HOB elevated   General bed mobility comments: exiting on R side Patient Response: Cooperative  Transfers                   General transfer comment: unable - transport en route to take pt to OR    Ambulation/Gait                  Stairs            Wheelchair Mobility     Tilt Bed Tilt Bed Patient Response: Cooperative  Modified Rankin (Stroke Patients Only)       Balance Overall balance assessment: Needs assistance Sitting-balance support: Feet supported, Bilateral upper extremity supported Sitting balance-Leahy Scale: Fair Sitting balance - Comments: able to maintain sitting balance with close supervision                                     Pertinent Vitals/Pain Pain Assessment Pain Assessment: Faces Faces Pain Scale: Hurts a little bit Pain Location: low back and L foot Pain Descriptors / Indicators: Discomfort Pain Intervention(s): Limited activity within patient's tolerance, Monitored during session, Premedicated before session, Repositioned    Home Living Family/patient expects to be discharged to:: Private residence Living Arrangements: Spouse/significant other Available Help at Discharge: Family;Available 24 hours/day Type of Home: House Home Access: Stairs to enter Entrance Stairs-Rails: Right;Left;Can reach both Entrance Stairs-Number of Steps: 4   Home Layout: One level Home Equipment: Transport chair;Grab bars - tub/shower;Rollator (4 wheels);Shower seat;Toilet riser;Adaptive equipment;Shower seat - built  in;Grab bars - toilet;Hand held shower head Additional Comments: pt gets around by pushing transport chair. RW and rollator won't fit through doorways    Prior Function Prior Level of Function : Independent/Modified Independent;Needs assist       Physical Assist : ADLs (physical)   ADLs (physical): Dressing Mobility Comments: pt reports being unable to stand  for prolonged periods of time       Extremity/Trunk Assessment   Upper Extremity Assessment Upper Extremity Assessment: Defer to OT evaluation    Lower Extremity Assessment Lower Extremity Assessment: LLE deficits/detail    Cervical / Trunk Assessment Cervical / Trunk Assessment: Kyphotic  Communication   Communication Communication: No apparent difficulties    Cognition Arousal: Alert Behavior During Therapy: WFL for tasks assessed/performed   PT - Cognitive impairments: No apparent impairments                         Following commands: Intact       Cueing Cueing Techniques: Verbal cues     General Comments      Exercises     Assessment/Plan    PT Assessment Patient needs continued PT services  PT Problem List Decreased strength;Decreased activity tolerance;Decreased balance;Decreased mobility;Decreased coordination;Cardiopulmonary status limiting activity       PT Treatment Interventions DME instruction;Gait training;Stair training;Functional mobility training;Therapeutic activities;Therapeutic exercise;Balance training;Patient/family education;Neuromuscular re-education    PT Goals (Current goals can be found in the Care Plan section)  Acute Rehab PT Goals Patient Stated Goal: to return home with HHPT PT Goal Formulation: With patient/family Time For Goal Achievement: 02/13/24 Potential to Achieve Goals: Good    Frequency Min 2X/week     Co-evaluation               AM-PAC PT 6 Clicks Mobility  Outcome Measure Help needed turning from your back to your side while in a flat bed without using bedrails?: A Little           6 Click Score: 3    End of Session   Activity Tolerance: Other (comment) (limited by transport arrival to take pt to OR) Patient left: in bed;with call bell/phone within reach;with bed alarm set;with nursing/sitter in room Nurse Communication: Mobility status PT Visit Diagnosis: Unsteadiness on feet  (R26.81);Other abnormalities of gait and mobility (R26.89);Muscle weakness (generalized) (M62.81);Pain Pain - Right/Left: Left Pain - part of body: Ankle and joints of foot    Time: 8881-8867 PT Time Calculation (min) (ACUTE ONLY): 14 min   Charges:   PT Evaluation $PT Eval Low Complexity: 1 Low   PT General Charges $$ ACUTE PT VISIT: 1 Visit         Therisa Stains PT, DPT Therisa HERO Zaunegger 01/30/2024, 11:49 AM

## 2024-01-30 NOTE — Anesthesia Postprocedure Evaluation (Signed)
 Anesthesia Post Note  Patient: [name]  Procedure(s) Performed: AMPUTATION LEFT SECOND TOE (Left: Toe) EXCISION LEFT FIFTH METATARSAL HEAD (Left: Toe)     Patient location during evaluation: PACU Anesthesia Type: MAC Level of consciousness: awake and alert Pain management: pain level controlled Vital Signs Assessment: post-procedure vital signs reviewed and stable Respiratory status: spontaneous breathing, nonlabored ventilation and respiratory function stable Cardiovascular status: stable and blood pressure returned to baseline Postop Assessment: no apparent nausea or vomiting Anesthetic complications: no   No notable events documented.  Last Vitals:  Vitals:   01/30/24 1415 01/30/24 1430  BP: (!) 145/86 133/74  Pulse: 90 86  Resp: 13 18  Temp:    SpO2: 98% 98%    Last Pain:  Vitals:   01/30/24 1342  TempSrc:   PainSc: 0-No pain                 Tommi Crepeau,W. EDMOND

## 2024-01-30 NOTE — Plan of Care (Signed)

## 2024-01-31 ENCOUNTER — Encounter (HOSPITAL_COMMUNITY): Payer: Self-pay | Admitting: Podiatry

## 2024-01-31 DIAGNOSIS — J9621 Acute and chronic respiratory failure with hypoxia: Secondary | ICD-10-CM | POA: Diagnosis not present

## 2024-01-31 DIAGNOSIS — J189 Pneumonia, unspecified organism: Secondary | ICD-10-CM | POA: Diagnosis not present

## 2024-01-31 DIAGNOSIS — I4819 Other persistent atrial fibrillation: Secondary | ICD-10-CM | POA: Diagnosis not present

## 2024-01-31 DIAGNOSIS — I739 Peripheral vascular disease, unspecified: Secondary | ICD-10-CM | POA: Diagnosis not present

## 2024-01-31 LAB — CBC
HCT: 25.7 % — ABNORMAL LOW (ref 36.0–46.0)
Hemoglobin: 8.1 g/dL — ABNORMAL LOW (ref 12.0–15.0)
MCH: 32.5 pg (ref 26.0–34.0)
MCHC: 31.5 g/dL (ref 30.0–36.0)
MCV: 103.2 fL — ABNORMAL HIGH (ref 80.0–100.0)
Platelets: 397 K/uL (ref 150–400)
RBC: 2.49 MIL/uL — ABNORMAL LOW (ref 3.87–5.11)
RDW: 16 % — ABNORMAL HIGH (ref 11.5–15.5)
WBC: 17.1 K/uL — ABNORMAL HIGH (ref 4.0–10.5)
nRBC: 0.1 % (ref 0.0–0.2)

## 2024-01-31 LAB — APTT: aPTT: 78 s — ABNORMAL HIGH (ref 24–36)

## 2024-01-31 LAB — BASIC METABOLIC PANEL WITH GFR
Anion gap: 10 (ref 5–15)
BUN: 30 mg/dL — ABNORMAL HIGH (ref 8–23)
CO2: 21 mmol/L — ABNORMAL LOW (ref 22–32)
Calcium: 7.2 mg/dL — ABNORMAL LOW (ref 8.9–10.3)
Chloride: 108 mmol/L (ref 98–111)
Creatinine, Ser: 1.65 mg/dL — ABNORMAL HIGH (ref 0.44–1.00)
GFR, Estimated: 30 mL/min — ABNORMAL LOW (ref >60)
Glucose, Bld: 89 mg/dL (ref 70–99)
Potassium: 4.4 mmol/L (ref 3.5–5.1)
Sodium: 139 mmol/L (ref 135–145)

## 2024-01-31 LAB — HEPARIN LEVEL (UNFRACTIONATED): Heparin Unfractionated: >1.1 [IU]/mL — ABNORMAL HIGH (ref 0.30–0.70)

## 2024-01-31 LAB — GLUCOSE, CAPILLARY: Glucose-Capillary: 88 mg/dL (ref 70–99)

## 2024-01-31 MED ORDER — CLOPIDOGREL BISULFATE 75 MG PO TABS
75.0000 mg | ORAL_TABLET | Freq: Every morning | ORAL | Status: DC
  Administered 2024-01-31 – 2024-02-01 (×2): 75 mg via ORAL
  Filled 2024-01-31 (×2): qty 1

## 2024-01-31 MED ORDER — HYDRALAZINE HCL 20 MG/ML IJ SOLN
5.0000 mg | Freq: Once | INTRAMUSCULAR | Status: AC | PRN
  Administered 2024-01-31: 5 mg via INTRAVENOUS
  Filled 2024-01-31: qty 1

## 2024-01-31 MED ORDER — AMLODIPINE BESYLATE 5 MG PO TABS
5.0000 mg | ORAL_TABLET | Freq: Every day | ORAL | Status: DC
  Administered 2024-01-31 – 2024-02-01 (×2): 5 mg via ORAL
  Filled 2024-01-31 (×2): qty 1

## 2024-01-31 MED ORDER — APIXABAN 2.5 MG PO TABS
2.5000 mg | ORAL_TABLET | Freq: Two times a day (BID) | ORAL | Status: DC
  Administered 2024-01-31 – 2024-02-01 (×3): 2.5 mg via ORAL
  Filled 2024-01-31 (×4): qty 1

## 2024-01-31 NOTE — Progress Notes (Signed)
 Physical Therapy Treatment Patient Details Name: [name] MRN: [id number] DOB: 12-13-1940 Today's Date: 01/31/2024   History of Present Illness Pt is an 83 y/o F  sent from podiatry office for evaluation of shortness of breath and new finding of osteomyelitis left second toe and left fifth metatarsal head. PMH includes chronic HFrEF, COPD, A-fib, CKD stage IIIa, PAD    PT Comments  Pt tolerated treatment well today. Pt is now s/p 2nd and 5th toe amputation on LLE. Pt today was able to ambulate in room with CGA/Min A with RW. Pt required constant cues for proximity to and safety with RW as she is used to pushing a transport chair at home. Pt declined hallway ambulation. Educated on proper sequencing with steps using handrails at home. Pt expressed understanding and proper teachback. States that her husband will be present 24/7 and properly guarding on stairs. No change in DC/DME recs at this time. PT will continue to follow.    If plan is discharge home, recommend the following: A little help with walking and/or transfers;A little help with bathing/dressing/bathroom;Direct supervision/assist for medications management;Direct supervision/assist for financial management;Assist for transportation;Help with stairs or ramp for entrance   Can travel by private vehicle        Equipment Recommendations  None recommended by PT    Recommendations for Other Services       Precautions / Restrictions Precautions Precautions: Fall Recall of Precautions/Restrictions: Intact Precaution/Restrictions Comments: Resting tremor Required Braces or Orthoses: Other Brace (Post op shoe) Restrictions Weight Bearing Restrictions Per Provider Order: Yes LLE Weight Bearing Per Provider Order: Weight bearing as tolerated Other Position/Activity Restrictions: WBAT in postop shoe     Mobility  Bed Mobility               General bed mobility comments: Up in recliner    Transfers Overall transfer  level: Needs assistance Equipment used: Rolling walker (2 wheels) Transfers: Sit to/from Stand Sit to Stand: Contact guard assist           General transfer comment: Cues for hand placement. CGA for safety.    Ambulation/Gait Ambulation/Gait assistance: Contact guard assist, Min assist Gait Distance (Feet): 35 Feet Assistive device: Rolling walker (2 wheels) Gait Pattern/deviations: Step-to pattern, Decreased stride length, Trunk flexed Gait velocity: decreased     General Gait Details: pt with significant trunk flexion despite repeated cues. increased assist with fatigue up to min A. Constant cues for proximity and safety with RW. Pt declined hallway ambulation today due to fatigue.   Stairs Stairs: Yes       General stair comments: Pt declined hallway ambulation. Educated on proper sequencing with steps using handrails at home. Pt expressed understanding and proper teachback. States that her husband will be present 24/7 and properly guarding on stairs.   Wheelchair Mobility     Tilt Bed    Modified Rankin (Stroke Patients Only)       Balance Overall balance assessment: Needs assistance Sitting-balance support: Feet supported, Bilateral upper extremity supported Sitting balance-Leahy Scale: Fair Sitting balance - Comments: able to maintain sitting balance with close supervision   Standing balance support: Bilateral upper extremity supported, During functional activity, Reliant on assistive device for balance Standing balance-Leahy Scale: Poor Standing balance comment: cues for position in the RW for safety                            Communication Communication Communication: No apparent difficulties  Cognition Arousal: Alert Behavior During Therapy: WFL for tasks assessed/performed   PT - Cognitive impairments: No apparent impairments                         Following commands: Intact      Cueing Cueing Techniques: Verbal cues   Exercises      General Comments General comments (skin integrity, edema, etc.): VSS      Pertinent Vitals/Pain Pain Assessment Pain Assessment: No/denies pain    Home Living                          Prior Function            PT Goals (current goals can now be found in the care plan section) Progress towards PT goals: Progressing toward goals    Frequency    Min 2X/week      PT Plan      Co-evaluation              AM-PAC PT 6 Clicks Mobility   Outcome Measure  Help needed turning from your back to your side while in a flat bed without using bedrails?: A Little Help needed moving from lying on your back to sitting on the side of a flat bed without using bedrails?: A Little Help needed moving to and from a bed to a chair (including a wheelchair)?: A Little Help needed standing up from a chair using your arms (e.g., wheelchair or bedside chair)?: A Little Help needed to walk in hospital room?: A Little Help needed climbing 3-5 steps with a railing? : A Lot 6 Click Score: 17    End of Session Equipment Utilized During Treatment: Gait belt Activity Tolerance: Patient tolerated treatment well;Patient limited by fatigue Patient left: in chair;with call bell/phone within reach;with chair alarm set Nurse Communication: Mobility status PT Visit Diagnosis: Unsteadiness on feet (R26.81);Other abnormalities of gait and mobility (R26.89);Muscle weakness (generalized) (M62.81);Pain Pain - Right/Left: Left Pain - part of body: Ankle and joints of foot     Time: 9081-9062 PT Time Calculation (min) (ACUTE ONLY): 19 min  Charges:      PT General Charges $$ ACUTE PT VISIT: 1 Visit                     Kimani Hovis B, PT, DPT Acute Rehab Services [phone]    Danae Oland 01/31/2024, 10:12 AM

## 2024-01-31 NOTE — Plan of Care (Signed)
   Problem: Education: Goal: Knowledge of General Education information will improve Description Including pain rating scale, medication(s)/side effects and non-pharmacologic comfort measures Outcome: Progressing   Problem: Clinical Measurements: Goal: Will remain free from infection Outcome: Progressing   Problem: Activity: Goal: Risk for activity intolerance will decrease Outcome: Progressing   Problem: Nutrition: Goal: Adequate nutrition will be maintained Outcome: Progressing   Problem: Safety: Goal: Ability to remain free from injury will improve Outcome: Progressing

## 2024-01-31 NOTE — Care Management Important Message (Signed)
 Important Message  Patient Details  Name: [name] MRN: [id number] Date of Birth: 01/26/1941   Important Message Given:  Yes - Medicare IM     Claretta Deed 01/31/2024, 1:58 PM

## 2024-01-31 NOTE — Progress Notes (Signed)
  Subjective:  Patient ID: [name], female    DOB: Aug 25, 1940,  MRN: [id number]  No chief complaint on file.   DOS: 01/30/24 Procedure: 1.  Amputation second toe at MPJ level, left foot 2. Fifth metatarsal head resection, left foot  83 y.o. female seen for post op check. Pt reports doing well this am though didn't sleep well last night. Has some pain in left foot though overall controlled. Walked and did ok with pt this am.   Review of Systems: Negative except as noted in the HPI. Denies N/V/F/Ch.   Objective:   Constitutional Well developed. Well nourished.  Vascular Foot warm and well perfused. Capillary refill normal to all digits.   No calf pain with palpation  Neurologic Normal speech. Oriented to person, place, and time. Epicritic sensation diminished to left foot  Dermatologic Amputation site L 2nd toe and 5th met head resection well coapted no drainage no dehiscence or evidence necrosis/ residual infeciton      Orthopedic: S/p L 2nd toe amputation at MPJ level, s/p L 5th metatarsal head resection.   Radiographs: Interval resection of the distal fifth metatarsal and second toe. Expected postoperative changes in the soft tissues.  Pathology: pending  Micro: NGTD bone 5th met head  Assessment:   1. HCAP (healthcare-associated pneumonia)   2. Acute on chronic respiratory failure with hypoxia (HCC)   Osteomyelitis L 2nd toe and 5th metatarsal head S/p L 2nd toe amputation and resection 5th metatarsal head   Plan:  Patient was evaluated and treated and all questions answered.  POD # 1 s/p above procedures -Progressing as expected post op, amputation sites healing well no drainage or dehiscence  -XR: expected changes -WB Status: WBAT in post op shoe to left foot -Sutures: remain intact 2-3 weeks. -Medications/ABX: recommend 5 days doxycycline  on discharge -Dressing: Changed today, leave dressing c/d/I until follow up next week - F/u Plan: Stable for dc  home tomorrow per primary, plan for follow up next week Thursday office to call. Will sign off.         [name], DPM [facility] / [facility]

## 2024-01-31 NOTE — Progress Notes (Signed)
 PROGRESS NOTE        PATIENT DETAILS Name: [name] Age: 83 y.o. Sex: female Date of Birth: [birth year] Admit Date: 01/28/2024 Admitting Physician Editha Ram, MD ERE:Yjoo, Norleen PEDLAR, MD  Brief Summary: Patient is a 83 y.o.  female with history of chronic HFrEF, COPD, A-fib, CKD stage IIIa, PAD-who was sent from podiatry office for evaluation of shortness of breath and new finding of osteomyelitis left second toe and left fifth metatarsal head.  Significant events: 10/11-10/15>> hospitalization-left leg pain-evaluated by vascular surgery-underwent popliteal artery intervention on 10/13  Significant studies: 10/21>> CXR: Emphysema-possible pneumonia right upper lung. 10/21>> MRI left foot: Marrow edema with possible osteomyelitis of fifth metatarsal head, possible osteomyelitis of distal second toe  Significant microbiology data: 10/21>> COVID/influenza/RSV PCR: Negative 10/21>> blood culture: No growth 10/23>> left fifth metatarsal head tissue: No growth.  Procedures: 10/23>> left fifth toe metatarsal head resection, amputation second toe at MPJ level left foot.  Consults: None  Subjective: Do not feel good.  No nausea, vomiting or diarrhea.  Not keen on going home today.  Objective: Vitals: Blood pressure (!) 142/76, pulse 87, temperature (!) 97.5 F (36.4 C), temperature source Oral, resp. rate 19, height 5' 3 (1.6 m), weight 46.5 kg, SpO2 94%.   Exam: Gen Exam:Alert awake-not in any distress HEENT:atraumatic, normocephalic Chest: B/L clear to auscultation anteriorly CVS:S1S2 regular Abdomen:soft non tender, non distended Extremities:no edema Neurology: Non focal Skin: no rash  Pertinent Labs/Radiology:    Latest Ref Rng & Units 01/31/2024    3:20 AM 01/30/2024    7:57 AM 01/29/2024    8:01 AM  CBC  WBC 4.0 - 10.5 K/uL 17.1  29.9  13.4   Hemoglobin 12.0 - 15.0 g/dL 8.1  8.5  8.6   Hematocrit 36.0 - 46.0 % 25.7  25.9  27.4    Platelets 150 - 400 K/uL 397  434  340     Lab Results  Component Value Date   NA 139 01/31/2024   K 4.4 01/31/2024   CL 108 01/31/2024   CO2 21 (L) 01/31/2024      Assessment/Plan: Left 2nd and 5th toe acute osteomyelitis S/p amputation 10/23 Seems stable-however complains that she is not feeling good-very vague complaints. All cultures negative so far Stop Zyvox -continue Unasyn-Will plan to switch to Augmentin  on discharge. Given advanced age-Will monitor for 1 more day before considering discharge  ?  Right upper lobe PNA Apparently hypoxic at podiatry's office yesterday but without any other symptoms Cultures negative so far See above regarding antibiotics  Acute hypoxic respiratory failure (hypoxic at podiatry's office to the 70s) Likely secondary to mild HFrEF-was not taking her diuretics at home Rapidly improved-on room air this morning Continue oral diuretics.   Acute on chronic HFrEF Probably improved-see above  COPD Currently stable-no wheezing-doubt exacerbation Continue bronchodilators  PAF Rate controlled Continue amiodarone /metoprolol  No longer on IV heparin -has been transition to Eliquis .  PAD-s/p balloon angioplasty of in-stent restenosis of left popliteal artery 10/13 No further surgical procedures planned-resume Plavix . Continue statin.    CKD stage IIIa Close to baseline  Hyperkalemia Resolved.  History of breast cancer-s/p B/L mastectomy/chemo/radiation Outpatient follow-up with oncology.  Code status:   Code Status: Limited: Do not attempt resuscitation (DNR) -DNR-LIMITED -Do Not Intubate/DNI    DVT Prophylaxis: IV heparin  apixaban  (ELIQUIS ) tablet 2.5 mg Start: 01/31/24 0800  Family Communication: None at bedside   Disposition Plan: Status is: Inpatient Remains inpatient appropriate because: Severity of illness   Planned Discharge Destination:Home health   Diet: Diet Order             Diet Heart Room service  appropriate? Yes; Fluid consistency: Thin; Fluid restriction: 1200 mL Fluid  Diet effective now                     Antimicrobial agents: Anti-infectives (From admission, onward)    Start     Dose/Rate Route Frequency Ordered Stop   01/30/24 1800  vancomycin  (VANCOREADY) IVPB 500 mg/100 mL  Status:  Discontinued        500 mg 100 mL/hr over 60 Minutes Intravenous Every 48 hours 01/29/24 0002 01/30/24 0921   01/30/24 1015  linezolid  (ZYVOX ) IVPB 600 mg        600 mg 300 mL/hr over 60 Minutes Intravenous Every 12 hours 01/30/24 0921     01/30/24 1015  Ampicillin-Sulbactam (UNASYN) 3 g in sodium chloride  0.9 % 100 mL IVPB        3 g 200 mL/hr over 30 Minutes Intravenous Every 12 hours 01/30/24 0921     01/29/24 1800  ceFEPIme  (MAXIPIME ) 2 g in sodium chloride  0.9 % 100 mL IVPB  Status:  Discontinued        2 g 200 mL/hr over 30 Minutes Intravenous Every 24 hours 01/28/24 2358 01/30/24 0921   01/29/24 0000  doxycycline  (VIBRAMYCIN ) 100 mg in sodium chloride  0.9 % 250 mL IVPB        100 mg 125 mL/hr over 120 Minutes Intravenous 2 times daily 01/28/24 2350     01/28/24 2030  ceFEPIme  (MAXIPIME ) 2 g in sodium chloride  0.9 % 100 mL IVPB        2 g 200 mL/hr over 30 Minutes Intravenous  Once 01/28/24 2019 01/28/24 2342   01/28/24 2015  vancomycin  (VANCOREADY) IVPB 750 mg/150 mL        20 mg/kg  37.5 kg 150 mL/hr over 60 Minutes Intravenous  Once 01/28/24 2005 01/29/24 0042        MEDICATIONS: Scheduled Meds:  amiodarone   200 mg Oral q morning   amLODipine   5 mg Oral Daily   apixaban   2.5 mg Oral BID   arformoterol  15 mcg Nebulization BID   budesonide (PULMICORT) nebulizer solution  0.25 mg Nebulization BID   clopidogrel   75 mg Oral q morning   ipratropium-albuterol   3 mL Nebulization TID   levothyroxine  75 mcg Oral Q0600   lidocaine   1 patch Transdermal Q24H   metoprolol  tartrate  75 mg Oral BID   mupirocin  ointment  1 Application Nasal BID   pantoprazole   40 mg Oral  Daily   potassium chloride   10 mEq Oral BID   pravastatin   40 mg Oral Daily   torsemide   20 mg Oral Daily   umeclidinium bromide   1 puff Inhalation Daily   Continuous Infusions:  ampicillin-sulbactam (UNASYN) IV 3 g (01/31/24 0919)   doxycycline  (VIBRAMYCIN ) IV 100 mg (01/31/24 1020)   linezolid  (ZYVOX ) IV 600 mg (01/31/24 0533)   PRN Meds:.acetaminophen  **OR** acetaminophen , ipratropium-albuterol , morphine  injection, naLOXone (NARCAN)  injection, oxyCODONE    I have personally reviewed following labs and imaging studies  LABORATORY DATA: CBC: Recent Labs  Lab 01/28/24 1652 01/29/24 0801 01/30/24 0757 01/31/24 0320  WBC 15.1* 13.4* 29.9* 17.1*  NEUTROABS 12.8*  --   --   --  HGB 8.9* 8.6* 8.5* 8.1*  HCT 28.2* 27.4* 25.9* 25.7*  MCV 103.3* 103.8* 101.2* 103.2*  PLT 340 340 434* 397    Basic Metabolic Panel: Recent Labs  Lab 01/28/24 1850 01/29/24 0801 01/30/24 0757 01/31/24 0320  NA 137 137 139 139  K 4.2 4.2 5.3* 4.4  CL 104 108 105 108  CO2 23 20* 21* 21*  GLUCOSE 81 184* 112* 89  BUN 23 26* 31* 30*  CREATININE 1.45* 1.50* 1.66* 1.65*  CALCIUM 8.1* 7.7* 7.9* 7.2*    GFR: Estimated Creatinine Clearance: 18.3 mL/min (A) (by C-G formula based on SCr of 1.65 mg/dL (H)).  Liver Function Tests: Recent Labs  Lab 01/28/24 1850 01/29/24 0801  AST 43* 38  ALT 41 39  ALKPHOS 109 102  BILITOT 1.1 0.7  PROT 6.4* 6.4*  ALBUMIN  2.2* 2.2*   No results for input(s): LIPASE, AMYLASE in the last 168 hours. No results for input(s): AMMONIA in the last 168 hours.  Coagulation Profile: Recent Labs  Lab 01/28/24 1652  INR 1.9*    Cardiac Enzymes: No results for input(s): CKTOTAL, CKMB, CKMBINDEX, TROPONINI in the last 168 hours.  BNP (last 3 results) No results for input(s): PROBNP in the last 8760 hours.  Lipid Profile: No results for input(s): CHOL, HDL, LDLCALC, TRIG, CHOLHDL, LDLDIRECT in the last 72 hours.  Thyroid  Function  Tests: No results for input(s): TSH, T4TOTAL, FREET4, T3FREE, THYROIDAB in the last 72 hours.  Anemia Panel: No results for input(s): VITAMINB12, FOLATE, FERRITIN, TIBC, IRON, RETICCTPCT in the last 72 hours.  Urine analysis:    Component Value Date/Time   COLORURINE YELLOW 01/29/2024 1228   APPEARANCEUR CLEAR 01/29/2024 1228   LABSPEC 1.016 01/29/2024 1228   PHURINE 5.0 01/29/2024 1228   GLUCOSEU NEGATIVE 01/29/2024 1228   HGBUR NEGATIVE 01/29/2024 1228   BILIRUBINUR NEGATIVE 01/29/2024 1228   KETONESUR NEGATIVE 01/29/2024 1228   PROTEINUR 30 (A) 01/29/2024 1228   UROBILINOGEN 0.2 02/15/2015 0417   NITRITE NEGATIVE 01/29/2024 1228   LEUKOCYTESUR NEGATIVE 01/29/2024 1228    Sepsis Labs: Lactic Acid, Venous    Component Value Date/Time   LATICACIDVEN 1.5 01/28/2024 1754    MICROBIOLOGY: Recent Results (from the past 240 hours)  Blood Culture (routine x 2)     Status: None (Preliminary result)   Collection Time: 01/28/24  5:32 PM   Specimen: BLOOD  Result Value Ref Range Status   Specimen Description BLOOD SITE NOT SPECIFIED  Final   Special Requests   Final    BOTTLES DRAWN AEROBIC AND ANAEROBIC Blood Culture results may not be optimal due to an inadequate volume of blood received in culture bottles   Culture   Final    NO GROWTH 3 DAYS Performed at [facility] Lab, [address], [place], [place] [address]    Report Status PENDING  Incomplete  Resp panel by RT-PCR (RSV, Flu A&B, Covid) Anterior Nasal Swab     Status: None   Collection Time: 01/28/24  6:36 PM   Specimen: Anterior Nasal Swab  Result Value Ref Range Status   SARS Coronavirus 2 by RT PCR NEGATIVE NEGATIVE Final   Influenza A by PCR NEGATIVE NEGATIVE Final   Influenza B by PCR NEGATIVE NEGATIVE Final    Comment: (NOTE) The Xpert Xpress SARS-CoV-2/FLU/RSV plus assay is intended as an aid in the diagnosis of influenza from Nasopharyngeal swab specimens and should not be used as  a sole basis for treatment. Nasal washings and aspirates are unacceptable  for Xpert Xpress SARS-CoV-2/FLU/RSV testing.  Fact Sheet for Patients: BloggerCourse.com  Fact Sheet for Healthcare Providers: SeriousBroker.it  This test is not yet approved or cleared by the [place]  FDA and has been authorized for detection and/or diagnosis of SARS-CoV-2 by FDA under an Emergency Use Authorization (EUA). This EUA will remain in effect (meaning this test can be used) for the duration of the COVID-19 declaration under Section 564(b)(1) of the Act, 21 U.S.C. section 360bbb-3(b)(1), unless the authorization is terminated or revoked.     Resp Syncytial Virus by PCR NEGATIVE NEGATIVE Final    Comment: (NOTE) Fact Sheet for Patients: BloggerCourse.com  Fact Sheet for Healthcare Providers: SeriousBroker.it  This test is not yet approved or cleared by the [place]  FDA and has been authorized for detection and/or diagnosis of SARS-CoV-2 by FDA under an Emergency Use Authorization (EUA). This EUA will remain in effect (meaning this test can be used) for the duration of the COVID-19 declaration under Section 564(b)(1) of the Act, 21 U.S.C. section 360bbb-3(b)(1), unless the authorization is terminated or revoked.  Performed at [facility], [address], [place], [place] [address]   Blood Culture (routine x 2)     Status: None (Preliminary result)   Collection Time: 01/28/24  9:52 PM   Specimen: BLOOD RIGHT HAND  Result Value Ref Range Status   Specimen Description BLOOD RIGHT HAND  Final   Special Requests   Final    BOTTLES DRAWN AEROBIC AND ANAEROBIC Blood Culture adequate volume   Culture   Final    NO GROWTH 3 DAYS Performed at [facility], [address] [place], [place] [address]    Report Status PENDING  Incomplete  Surgical PCR screen     Status: None    Collection Time: 01/29/24 10:30 PM   Specimen: Nasal Mucosa; Nasal Swab  Result Value Ref Range Status   MRSA, PCR NEGATIVE NEGATIVE Final   Staphylococcus aureus NEGATIVE NEGATIVE Final    Comment: (NOTE) The Xpert SA Assay (FDA approved for NASAL specimens in patients 79 years of age and older), is one component of a comprehensive surveillance program. It is not intended to diagnose infection nor to guide or monitor treatment. Performed at [facility], [address], [place], [place] [address]   Aerobic/Anaerobic Culture w Gram Stain (surgical/deep wound)     Status: None (Preliminary result)   Collection Time: 01/30/24  1:22 PM   Specimen: Foot, Left; Amputation  Result Value Ref Range Status   Specimen Description TISSUE  Final   Special Requests LEFT FIFTH METATARSAL HEAD  Final   Gram Stain NO WBC SEEN NO ORGANISMS SEEN   Final   Culture   Final    NO GROWTH < 24 HOURS Performed at [facility], [address] [place], [place] [address]    Report Status PENDING  Incomplete    RADIOLOGY STUDIES/RESULTS: DG Foot 2 Views Left Result Date: 01/30/2024 CLINICAL DATA:  Postop. EXAM: LEFT FOOT - 2 VIEW COMPARISON:  Radiograph 01/28/2024 FINDINGS: Interval resection of the distal fifth metatarsal. The fifth toe remains in site 2. Interval resection of the second toe. Previous transmetatarsal amputation of the first ray. Expected postoperative changes in the soft tissues at the operative bed. The bones are subjectively under mineralized. IMPRESSION: Interval resection of the distal fifth metatarsal and second toe. Expected postoperative changes in the soft tissues. Electronically Signed   By: Andrea Gasman M.D.   On: 01/30/2024  16:34     LOS: 3 days   Donalda Applebaum, MD  [facility]    To contact the attending provider between 7A-7P or the covering provider during after hours 7P-7A, please log into the web site [email or web address] and access using universal  [place] password for that web site. If you do not have the password, please call the hospital operator.  01/31/2024, 10:32 AM

## 2024-01-31 NOTE — Evaluation (Addendum)
 Occupational Therapy Evaluation Patient Details Name: [name] MRN: [id number] DOB: [birth year] Today's Date: 01/31/2024   History of Present Illness   Pt is an 83 y/o F  sent from podiatry office for evaluation of shortness of breath and new finding of osteomyelitis left second toe and left fifth metatarsal head. PMH includes chronic HFrEF, COPD, A-fib, CKD stage IIIa, PAD     Clinical Impressions Patient admitted for the diagnosis above.  PTA  she lives at home with her spouse, who was providing physical assist due to L foot injury.  Patient was receiving [facility] RN and PT per the patient.  Acute OT can defer to [facility] PT if [facility] OT is needed.  Sounds as if the patient and her spouse have a good routine with respect to ADL completion.  OT will follow in the acute setting to address deficits.       If plan is discharge home, recommend the following:   A little help with walking and/or transfers;A little help with bathing/dressing/bathroom     Functional Status Assessment   Patient has had a recent decline in their functional status and demonstrates the ability to make significant improvements in function in a reasonable and predictable amount of time.     Equipment Recommendations   None recommended by OT     Recommendations for Other Services         Precautions/Restrictions   Precautions Precautions: None Recall of Precautions/Restrictions: Intact Restrictions Weight Bearing Restrictions Per Provider Order: Yes LLE Weight Bearing Per Provider Order: Weight bearing as tolerated Other Position/Activity Restrictions: WBAT in postop shoe     Mobility Bed Mobility               General bed mobility comments: Up in recliner    Transfers Overall transfer level: Needs assistance Equipment used: Rolling walker (2 wheels) Transfers: Sit to/from Stand Sit to Stand: Supervision                  Balance Overall balance assessment: Needs  assistance Sitting-balance support: Feet supported Sitting balance-Leahy Scale: Good     Standing balance support: Reliant on assistive device for balance Standing balance-Leahy Scale: Fair                             ADL either performed or assessed with clinical judgement   ADL   Eating/Feeding: Independent;Sitting   Grooming: Wash/dry hands;Supervision/safety;Standing   Upper Body Bathing: Supervision/ safety;Sitting   Lower Body Bathing: Supervison/ safety;Sit to/from stand   Upper Body Dressing : Supervision/safety;Sitting       Toilet Transfer: Ambulation;Rolling walker (2 wheels);Supervision/safety;Regular Toilet                   Vision Patient Visual Report: No change from baseline       Perception Perception: Not tested       Praxis Praxis: Not tested       Pertinent Vitals/Pain Pain Assessment Pain Assessment: No/denies pain Pain Intervention(s): Monitored during session     Extremity/Trunk Assessment Upper Extremity Assessment Upper Extremity Assessment: Overall WFL for tasks assessed   Lower Extremity Assessment Lower Extremity Assessment: Defer to PT evaluation   Cervical / Trunk Assessment Cervical / Trunk Assessment: Kyphotic   Communication Communication Communication: No apparent difficulties   Cognition Arousal: Alert Behavior During Therapy: WFL for tasks assessed/performed Cognition: No apparent impairments  Following commands: Intact       Cueing  General Comments   Cueing Techniques: Verbal cues  VSS   Exercises     Shoulder Instructions      Home Living Family/patient expects to be discharged to:: Private residence Living Arrangements: Spouse/significant other Available Help at Discharge: Family;Available 24 hours/day Type of Home: House Home Access: Stairs to enter [employer] of Steps: 4 Entrance Stairs-Rails: Right;Left;Can reach  both Home Layout: One level     Bathroom Shower/Tub: Producer, television/film/video: Handicapped height Bathroom Accessibility: No   Home Equipment: Transport chair;Grab bars - tub/shower;Rollator (4 wheels);Shower seat;Toilet riser;Adaptive equipment;Shower seat - built in;Grab bars - toilet;Hand held Clinical biochemist: Reacher Additional Comments: pt gets around by pushing transport chair. RW and rollator won't fit through doorways      Prior Functioning/Environment Prior Level of Function : Independent/Modified Independent;Needs assist               ADLs Comments: spouse assists with lower body adl, light meal prep, iADL and community mobility    OT Problem List: Decreased activity tolerance;Impaired balance (sitting and/or standing);Decreased safety awareness;Decreased knowledge of use of DME or AE;Decreased knowledge of precautions;Cardiopulmonary status limiting activity   OT Treatment/Interventions: Self-care/ADL training;Therapeutic exercise;Energy conservation;DME and/or AE instruction;Therapeutic activities;Patient/family education;Balance training      OT Goals(Current goals can be found in the care plan section)   Acute Rehab OT Goals Patient Stated Goal: Return home OT Goal Formulation: With patient Time For Goal Achievement: 02/14/24 Potential to Achieve Goals: Good ADL Goals Pt Will Perform Grooming: with modified independence;standing Pt Will Perform Lower Body Dressing: with modified independence;sit to/from stand Pt Will Transfer to Toilet: with modified independence;ambulating;regular height toilet   OT Frequency:  Min 2X/week    Co-evaluation              AM-PAC OT 6 Clicks Daily Activity     Outcome Measure Help from another person eating meals?: None Help from another person taking care of personal grooming?: A Little Help from another person toileting, which includes using toliet, bedpan, or urinal?: A Little Help from  another person bathing (including washing, rinsing, drying)?: A Little Help from another person to put on and taking off regular upper body clothing?: None Help from another person to put on and taking off regular lower body clothing?: A Little 6 Click Score: 20   End of Session Equipment Utilized During Treatment: Rolling walker (2 wheels) Nurse Communication: Mobility status  Activity Tolerance: Patient tolerated treatment well Patient left: in chair;with call bell/phone within reach;with chair alarm set  OT Visit Diagnosis: Unsteadiness on feet (R26.81)                Time: 8972-8953 OT Time Calculation (min): 19 min Charges:  OT General Charges $OT Visit: 1 Visit OT Evaluation $OT Eval Moderate Complexity: 1 Mod  01/31/2024  RP, OTR/L  Acute Rehabilitation Services  Office:  [phone]   Charlie JONETTA Halsted 01/31/2024, 10:51 AM

## 2024-02-01 ENCOUNTER — Other Ambulatory Visit (HOSPITAL_COMMUNITY): Payer: Self-pay

## 2024-02-01 DIAGNOSIS — I5033 Acute on chronic diastolic (congestive) heart failure: Secondary | ICD-10-CM | POA: Diagnosis not present

## 2024-02-01 DIAGNOSIS — I4819 Other persistent atrial fibrillation: Secondary | ICD-10-CM | POA: Diagnosis not present

## 2024-02-01 DIAGNOSIS — I739 Peripheral vascular disease, unspecified: Secondary | ICD-10-CM | POA: Diagnosis not present

## 2024-02-01 DIAGNOSIS — J189 Pneumonia, unspecified organism: Secondary | ICD-10-CM | POA: Diagnosis not present

## 2024-02-01 LAB — CBC
HCT: 25.2 % — ABNORMAL LOW (ref 36.0–46.0)
Hemoglobin: 8.2 g/dL — ABNORMAL LOW (ref 12.0–15.0)
MCH: 32.5 pg (ref 26.0–34.0)
MCHC: 32.5 g/dL (ref 30.0–36.0)
MCV: 100 fL (ref 80.0–100.0)
Platelets: 386 K/uL (ref 150–400)
RBC: 2.52 MIL/uL — ABNORMAL LOW (ref 3.87–5.11)
RDW: 16.3 % — ABNORMAL HIGH (ref 11.5–15.5)
WBC: 14.8 K/uL — ABNORMAL HIGH (ref 4.0–10.5)
nRBC: 0 % (ref 0.0–0.2)

## 2024-02-01 MED ORDER — AMLODIPINE BESYLATE 5 MG PO TABS
5.0000 mg | ORAL_TABLET | Freq: Every day | ORAL | 1 refills | Status: DC
  Filled 2024-02-01: qty 30, 30d supply, fill #0

## 2024-02-01 MED ORDER — AMOXICILLIN-POT CLAVULANATE 500-125 MG PO TABS
1.0000 | ORAL_TABLET | Freq: Two times a day (BID) | ORAL | 0 refills | Status: AC
  Filled 2024-02-01: qty 4, 2d supply, fill #0

## 2024-02-01 NOTE — Plan of Care (Signed)
  Problem: Clinical Measurements: Goal: Will remain free from infection Outcome: Progressing Goal: Respiratory complications will improve Outcome: Progressing   Problem: Activity: Goal: Risk for activity intolerance will decrease Outcome: Progressing   Problem: Nutrition: Goal: Adequate nutrition will be maintained Outcome: Progressing   Problem: Safety: Goal: Ability to remain free from injury will improve Outcome: Progressing   Problem: Skin Integrity: Goal: Risk for impaired skin integrity will decrease Outcome: Progressing

## 2024-02-01 NOTE — Evaluation (Signed)
 RT Evaluate and Treat Note  02/01/2024   Breathing is (select one): Better than normal   The following was found on auscultation (select multiple):  Bilateral Breath Sounds: Clear (02/01/24 0808)  R Upper  Breath Sounds: Clear (02/01/24 0808) L Upper Breath Sounds: Clear (02/01/24 0808) R Lower Breath Sounds: Diminished (02/01/24 0808) L Lower Breath Sounds: Diminished (02/01/24 0808)    Cough Assessment:      Most Recent Chest Xray:... (No results found.    The following medications and/or interventions were ordered/changed/discontinued as part of the Respiratory Treatment protocol:   Medication Changes: IncruseOrdered   Airway Clearance Changes: Cough & Deep Breath  Ordered   Oxygen Therapy Changes:Nasal Discontinued

## 2024-02-01 NOTE — Discharge Summary (Addendum)
 PATIENT DETAILS Name: [name] Age: 83 y.o. Sex: female Date of Birth: 1941/01/22 MRN: [id number]. Admitting Physician: Editha Ram, MD ERE:Yjoo, Norleen PEDLAR, MD  Admit Date: 01/28/2024 Discharge date: 02/01/2024  Recommendations for Outpatient Follow-up:  Follow up with PCP in 1-2 weeks Please obtain CMP/CBC in one week Please ensure follow up with podiatry Follow intraoperative cultures from left fifth toe until final (negative at the time of discharge)  Admitted From:  Home  Disposition: Home health   Discharge Condition: good  CODE STATUS:   Code Status: Limited: Do not attempt resuscitation (DNR) -DNR-LIMITED -Do Not Intubate/DNI    Diet recommendation:  Diet Order             Diet - low sodium heart healthy           Diet Heart Room service appropriate? Yes; Fluid consistency: Thin; Fluid restriction: 1200 mL Fluid  Diet effective now                    Brief Summary: Patient is a 83 y.o.  female with history of chronic HFrEF, COPD, A-fib, CKD stage IIIa, PAD-who was sent from podiatry office for evaluation of shortness of breath and new finding of osteomyelitis left second toe and left fifth metatarsal head.   Significant events: 10/11-10/15>> hospitalization-left leg pain-evaluated by vascular surgery-underwent popliteal artery intervention on 10/13   Significant studies: 10/21>> CXR: Emphysema-possible pneumonia right upper lung. 10/21>> MRI left foot: Marrow edema with possible osteomyelitis of fifth metatarsal head, possible osteomyelitis of distal second toe   Significant microbiology data: 10/21>> COVID/influenza/RSV PCR: Negative 10/21>> blood culture: No growth 10/23>> left fifth metatarsal head tissue: No growth.   Procedures: 10/23>> left fifth toe metatarsal head resection, amputation second toe at MPJ level left foot.  Brief Hospital Course: Left 2nd and 5th toe acute osteomyelitis S/p amputation 10/23 Initially on  broad-spectrum antibiotics-Will be switched to Augmentin  on discharge All culture data negative so far Ensure outpatient follow-up with podiatry.   Per podiatry-weightbearing as tolerated in postop shoe to left foot, sutures remain intact for 2-3 weeks-leave dressing in place-until follow-up next week at podiatry office.   ?  Right upper lobe PNA Apparently hypoxic at podiatry's office yesterday but without any other symptoms Cultures negative so far See above regarding antibiotics   Acute hypoxic respiratory failure (hypoxic at podiatry's office to the 70s) Likely secondary to mild HFrEF-was not taking her diuretics at home Rapidly improved-on room air this morning Continue oral diuretics.    Acute on chronic HFrEF Probably improved-see above   COPD Currently stable-no wheezing-doubt exacerbation Continue bronchodilators   PAF Rate controlled Continue amiodarone /metoprolol  No longer on IV heparin -has been transition to Eliquis .   PAD-s/p balloon angioplasty of in-stent restenosis of left popliteal artery 10/13 No further surgical procedures planned-resume Plavix . Continue statin.     CKD stage IIIa Close to baseline   Hyperkalemia Resolved.   History of breast cancer-s/p B/L mastectomy/chemo/radiation Outpatient follow-up with oncology.  Discharge Diagnoses:  Principal Problem:   Pneumonia Active Problems:   Persistent atrial fibrillation (HCC)   PAD (peripheral artery disease) with history of multiple angioplasty and stent placement   Essential hypertension   ACUTE ON CHRONIC DIASTOLIC HEART FAILURE   CAD in native artery   Osteomyelitis of second toe of left foot (HCC)   CKD stage 3b, GFR 30-44 ml/min (HCC)   Hypothyroidism   COPD with acute exacerbation (HCC)   Osteomyelitis (HCC)   Discharge Instructions:  Activity:  As tolerated   Discharge Instructions     Call MD for:  difficulty breathing, headache or visual disturbances   Complete by: As  directed    Call MD for:  redness, tenderness, or signs of infection (pain, swelling, redness, odor or green/yellow discharge around incision site)   Complete by: As directed    Diet - low sodium heart healthy   Complete by: As directed    Increase activity slowly   Complete by: As directed    Weightbearing status: Weightbearing as tolerated in post op shoe to left foot   No wound care   Complete by: As directed    Dressing: leave dressing c/d/I until follow up next week      Allergies as of 02/01/2024       Reactions   Iodine  Rash, Other (See Comments)   BETADINE, blistering rash/burns   Iohexol  Rash, Other (See Comments)   Blisters; PT NEEDS 13-HOUR PREP   Petroleum Gauze Non-woven 3x9 [wound Dressings] Other (See Comments)   BURNING SENSATION, RED SKIN   Sulfa  Antibiotics Nausea And Vomiting   Wound Dressing Adhesive Other (See Comments)   Burns skin   Tape Itching, Rash, Other (See Comments)   DO NOT USE ADHESIVE TAPE Not even a Band Aid NO PAPER TAPE Burns/tears skin Skin is very very thin        Medication List     TAKE these medications    acetaminophen  325 MG tablet Commonly known as: TYLENOL  Take 2 tablets (650 mg total) by mouth every 6 (six) hours as needed for mild pain (pain score 1-3) (or Fever >/= 101).   AMBULATORY NON FORMULARY MEDICATION Dispense one Readi Steadi glove   amiodarone  200 MG tablet Commonly known as: PACERONE  Take 200 mg by mouth every morning.   amLODipine  5 MG tablet Commonly known as: NORVASC  Take 1 tablet (5 mg total) by mouth daily.   amoxicillin -clavulanate 875-125 MG tablet Commonly known as: AUGMENTIN  Take 1 tablet by mouth 2 (two) times daily for 2 days.   apixaban  2.5 MG Tabs tablet Commonly known as: Eliquis  Take 1 tablet (2.5 mg total) by mouth 2 (two) times daily.   ascorbic acid  500 MG tablet Commonly known as: VITAMIN C  Take 500 mg by mouth in the morning.   CALCIUM PO Take 1,200 mg by mouth 2  (two) times daily. 600 mg each   clopidogrel  75 MG tablet Commonly known as: PLAVIX  Take 1 tablet (75 mg total) by mouth every morning.   denosumab  60 MG/ML Sosy injection Commonly known as: PROLIA  Inject 60 mg into the skin every 6 (six) months.   docusate sodium  100 MG capsule Commonly known as: COLACE Take 1 capsule (100 mg total) by mouth 2 (two) times daily.   HYDROcodone -acetaminophen  5-325 MG tablet Commonly known as: NORCO/VICODIN Take 1 tablet by mouth every 8 (eight) hours as needed for moderate pain (pain score 4-6) or severe pain (pain score 7-10).   Incruse Ellipta  62.5 MCG/ACT Aepb Generic drug: umeclidinium bromide  Inhale 1 puff into the lungs daily.   IRON PO Take 65 mg by mouth at bedtime.   levothyroxine 75 MCG tablet Commonly known as: SYNTHROID Take 75 mcg by mouth daily before breakfast.   MAGNESIUM  OXIDE PO Take 500 mg by mouth every morning.   metoprolol  tartrate 25 MG tablet Commonly known as: LOPRESSOR  Take 3 tablets (75 mg total) by mouth 2 (two) times daily.   multivitamin with minerals Tabs tablet Take 1 tablet  by mouth every morning.   mupirocin  ointment 2 % Commonly known as: BACTROBAN  Apply 1 Application topically daily. What changed: additional instructions   pantoprazole  40 MG tablet Commonly known as: PROTONIX  TAKE 1 TABLET DAILY   potassium chloride  10 MEQ tablet Commonly known as: KLOR-CON  M Take 2 tablets (20 mEq total) by mouth daily. What changed:  how much to take when to take this   pravastatin  40 MG tablet Commonly known as: PRAVACHOL  Take 1 tablet (40 mg total) by mouth daily.   Salonpas 3.04-14-08 % Ptch Generic drug: Camphor-Menthol-Methyl Sal Place 1 patch onto the skin daily as needed (pain.).   torsemide  20 MG tablet Commonly known as: DEMADEX  Take 1 tablet (20 mg total) by mouth 2 (two) times daily. What changed: when to take this        Follow-up Information     [facility]  Follow up.   Specialty: Home Health Services Why: Someone will call you to schedule resumption of care visit. Contact information: [address] [place] [address] [phone]         [name], MD. Schedule an appointment as soon as possible for a visit in 1 week(s).   Specialty: Internal Medicine Contact information: [address] [place] [facility] [address] [phone]         Malvin Marsa J[place]NA, DPM Follow up on 02/06/2024.   Specialty: Podiatry Why: appointment at 8:30 am Contact information: [address] [place] [address] [phone]                Allergies  Allergen Reactions   Iodine  Rash and Other (See Comments)    BETADINE, blistering rash/burns   Iohexol  Rash and Other (See Comments)    Blisters; PT NEEDS 13-HOUR PREP    Petroleum Gauze Non-Woven 3x9 [Wound Dressings] Other (See Comments)    BURNING SENSATION, RED SKIN   Sulfa  Antibiotics Nausea And Vomiting   Wound Dressing Adhesive Other (See Comments)    Burns skin   Tape Itching, Rash and Other (See Comments)    DO NOT USE ADHESIVE TAPE Not even a Band Aid NO PAPER TAPE Burns/tears skin Skin is very very thin     Other Procedures/Studies: DG Foot 2 Views Left Result Date: 01/30/2024 CLINICAL DATA:  Postop. EXAM: LEFT FOOT - 2 VIEW COMPARISON:  Radiograph 01/28/2024 FINDINGS: Interval resection of the distal fifth metatarsal. The fifth toe remains in site 2. Interval resection of the second toe. Previous transmetatarsal amputation of the first ray. Expected postoperative changes in the soft tissues at the operative bed. The bones are subjectively under mineralized. IMPRESSION: Interval resection of the distal fifth metatarsal and second toe. Expected postoperative changes in the soft tissues. Electronically Signed   By: Andrea Gasman M.D.   On: 01/30/2024 16:34   MR FOOT LEFT WO CONTRAST Result Date: 01/29/2024 EXAM: MRI of the  left Foot without contrast. 01/28/2024 10:38:13 PM TECHNIQUE: Multiplanar multisequence MRI of the left foot was performed without the administration of intravenous contrast. COMPARISON: Left foot radiographs 01/28/2024. CLINICAL HISTORY: Concern also for possible osteomyelitis of the toe. FINDINGS: BONE MARROW: Status post amputation of the first ray to the level of the first metatarsal mid shaft. The transection margin appears intact. Second through fifth digit hammertoes. Evaluation of the distal phalanges is limited due to slice selection and field inhomogeneity. Sagittal STIR sequence demonstrates increased signal of the tuft of the second distal phalanx and the  third distal phalanx. Curvilinear low signal of the third and fourth metatarsal head and neck junctions with corresponding increased linear T2 signal, suspicious for subacute versus chronic nondisplaced fractures. Marrow edema of the fifth metatarsal head most pronounced laterally without significant corresponding confluent T1 hypointensity. There is a region of subtle curvilinear low signal intensity noted medially at the fifth metatarsal head. These findings could reflect subacute versus chronic nondisplaced fracture of the fifth metatarsal base versus reactive marrow edema Osteomyelitis is felt less likely, however, cannot be excluded. Mild flattening of the second metatarsal head without appreciable marrow signal abnormality. Mild degenerative arthropathy of the midfoot and forefoot. No joint effusion. No dislocation. LISFRANC JOINT: Visualized Lisfranc ligament is intact. TENDONS: No significant tenosynovitis. SOFT TISSUES: Atrophy of the intrinsic musculature of the foot likely reflects chronic denervation changes. Cutaneous irregularity/ulceration along the lateral aspect of the fifth metatarsal head. Cutaneous irregularity of the distal second toe. No loculated fluid collection. Subcutaneous edema along the dorsal forefoot. IMPRESSION: 1. Marrow  edema of the fifth metatarsal head without significant corresponding confluent T1 hypointensity. There is a region of subtle curvilinear low signal intensity noted medially at the fifth metatarsal head. These findings could reflect subacute versus chronic nondisplaced fracture of the fifth metatarsal head versus reactive marrow edema. Osteomyelitis is felt less likely, however, cannot be excluded given cutaneous irregularity/ulceration overlying the lateral fifth metatarsal head. 2. Limited evaluation of the distal phalanges. Increased STIR signal intensity of the tuft of the second distal phalanx with cutaneous irregularity of the distal second toe and increased STIR signal intensity of the third distal phalanx. Osteomyelitis cannot be excluded. 3. Findings suspicious for subacute versus chronic nondisplaced fractures of the third and fourth metatarsal head and neck junctions. 4. Status post first ray amputation to the mid shaft of the first metatarsal. 5. No loculated fluid collection. 6. Subcutaneous edema along the dorsal forefoot. Electronically signed by: Harrietta Sherry MD 01/29/2024 09:06 AM EDT RP Workstation: [id number]   DG Chest Port 1 View Result Date: 01/28/2024 CLINICAL DATA:  Questionable sepsis - evaluate for abnormality EXAM: PORTABLE CHEST - 1 VIEW COMPARISON:  07/24/2023 FINDINGS: Emphysema. Biapical pleural thickening. Hazy airspace opacities in the right upper lung zone. Mild cardiomegaly. Tortuous aorta with aortic atherosclerosis. No acute fracture or destructive lesions. Multilevel thoracic osteophytosis. Diffuse osteopenia. Surgical clips in the superior left breast. IMPRESSION: Emphysema. Hazy airspace opacities in the right upper lung zone, worrisome for a developing bronchopneumonia. Electronically Signed   By: Rogelia Myers M.D.   On: 01/28/2024 19:16   DG Foot Complete Left Result Date: 01/28/2024 Please see detailed radiograph report in office note.  MR TIBIA FIBULA LEFT  WO CONTRAST Result Date: 01/22/2024 CLINICAL DATA:  Left lower leg pain EXAM: MRI OF LOWER LEFT EXTREMITY WITHOUT CONTRAST TECHNIQUE: Multiplanar, multisequence MR imaging of the left tibia/fibula was performed. No intravenous contrast was administered. COMPARISON:  Radiographs 01/18/2024 FINDINGS: Bones/Joint/Cartilage No fracture or marrow edema identified. No compelling findings of medial tibial stress syndrome. Ligaments N/A Muscles and Tendons Low-level edema observed in the proximal to mid calf along the lateral soleus, tibialis anterior, proximal tibialis posterior, and to a lesser extent peroneus longus and brevis musculature potentially reflecting muscle strain, denervation edema, or myositis. Likewise there is moderate regional atrophy diffusely in the calf. Soft tissues Subcutaneous edema noted posterolaterally and posteromedially along the calf. This may be incidental but cellulitis is not excluded. IMPRESSION: 1. Low-level edema observed in the proximal to mid calf within along the lateral soleus, tibialis  anterior, proximal tibialis posterior, and to a lesser extent peroneus longus and brevis musculature potentially reflecting muscle strain, low-grade denervation edema, or myositis. 2. Moderate regional atrophy in the calf. 3. Subcutaneous edema posterolaterally and posteromedially along the calf. This may be incidental but cellulitis is not excluded. 4. No fracture or findings of medial tibial stress syndrome. Electronically Signed   By: Ryan Salvage M.D.   On: 01/22/2024 12:02   PERIPHERAL VASCULAR CATHETERIZATION Result Date: 01/20/2024 Images from the original result were not included. Patient name: [name] MRN: [id number] DOB: [birth year] Sex: female 01/20/2024 Pre-operative Diagnosis: Chronic limb-threatening ischemia with tissue loss of the left lower extremity Post-operative diagnosis:  Same Surgeon:  Norman GORMAN Serve, MD Procedure Performed: Ultrasound-guided access of right  common femoral artery Aortogram left lower extremity angiogram Intravascular lithotripsy of left popliteal, 5 mm E8 balloon Drug-coated balloon angioplasty of left popliteal, 5 mm x 100 mm Ranger Drug-coated balloon angioplasty of in-stent restenosis of left popliteal, 6 mm x 80 mm Ranger 44 minutes of moderate sedation with fentanyl  Versed  Indications: Ms. Defeo is an 83 year old female who has undergone multiple vascular interventions in the past and has a left SFA to popliteal stent.  She has a left ankle wound and tenderness in her calf.  Duplex demonstrated a significant stenosis in the left popliteal and therefore risks and benefits of angiogram with intervention were reviewed.  She elected to proceed. Findings: Patent aorta and bilateral renal arteries.  Patent iliac systems bilaterally. Left common femoral, profunda and SFA stents are patent.  At the distal stent edge and in the left popliteal artery behind the knee there are tandem lesions that appear to be severe near total occlusion.  There is two-vessel runoff via the peroneal and AT.  Procedure:  The patient was identified in the holding area and taken to the cath lab  The patient was then placed supine on the table and prepped and draped in the usual sterile fashion.  A time out was called.  Ultrasound was used to evaluate the right common femoral artery.  It was patent .  A digital ultrasound image was acquired.  A micropuncture needle was used to access the right common femoral artery under ultrasound guidance.  An 018 wire was advanced without resistance and a micropuncture sheath was placed.  The 018 wire was removed and a benson wire was placed.  The micropuncture sheath was exchanged for a 5 french sheath.  An omniflush catheter was advanced over the wire to the level of L-1.  An abdominal angiogram was obtained.  Next, using the omniflush catheter and a Bentson wire, the aortic bifurcation was crossed and the catheter was placed into theleft  external iliac artery and left runoff was obtained. This demonstrated the above findings.  A glide advantage wire was then placed through the catheter and into the left SFA stents.  The short 5 French sheath was then exchanged for a 6 French by 40 cm catapult sheath and the patient was systemically heparinized.  Using the glide advantage wire and a quick cross catheter the popliteal lesions were crossed.  An angiogram via the catheter demonstrated true lumen access distally.  An 014 wire was then placed through the catheter and the lesions were first treated with a 5 mm shockwave balloon for total of 6 pulses throughout the segment.  In the nonstented segment of the behind the knee popliteal a 5 mm x 100 mm Ranger balloon was used and for the in-stent restenosis  a 6 mm x 80 mm Ranger balloon was used.  Completion angiogram demonstrated brisk flow through the stents and wide patency of the treated segments with a short segment dissection that was not flow-limiting.  There was preserved runoff via the peroneal which is innominate and diseased AT.  Given the right common femoral disease the patient was transferred to recovery with plan for a sheath pull and manual pressure will Contrast: 60 cc Sedation: 44 minutes Impression: Maximally revascularized on the left with inline flow through the SFA/popliteal stents with a dominant peroneal and diseased AT. Norman GORMAN Serve MD [facility] Office: [phone]  VAS US  LOWER EXTREMITY ARTERIAL DUPLEX Result Date: 01/20/2024 LOWER EXTREMITY ARTERIAL DUPLEX STUDY Patient Name:  [name]  Date of Exam:   01/19/2024 Medical Rec #: [id number]     Accession #:    [id number] Date of Birth: July 07, 1940     Patient Gender: F Patient Age:   53 years Exam Location:  [facility] Procedure:      VAS US  LOWER EXTREMITY ARTERIAL DUPLEX Referring Phys: MICAELA SUNDIL --------------------------------------------------------------------------------   Indications: Acute onset of left leg pain (focal area of mid lateral calf) High Risk Factors: Hypertension, hyperlipidemia, past history of smoking. Atrial                    fibrillation, on Eliquis . Carotid artery disease. History of                    right inominate occlusion. Vascular Intervention: 12/19/2021 Stent, left superficial femoral artery,                        Drug-coated balloon angioplasty, left popliteal artery,                        and Angioplasty, left peroneal artery. 12/19/21 Left great                        toe amputation. 08/28/22 Laser atherectomy with                        angioplasty of left popliteal artery, laser atherectomy                        with angioplasty of left anterior tibial artery 10/16/22                        Laser atherectomy and angioplasty of right anterior                        tibial artery, and intra-arterial injection of                        nitroglycerin  into the anterior tibial artery. 02/26/23                        Laser atherectomy and drug-coated balloon angioplasty,                        right superficial femoral and popliteal artery, and                        angioplasty of the right anterior  tibial and peroneal                        arteries. 07/16/2023 Laser atherectomy left superficial                        femoral artery and drug-coated balloon angioplasty of the                        left superficial femoral artery.   Current ABI: N/A Limitations: Patient involuntary movement when scanning site of concern Comparison Study: Prior left LEA done 08/22/23 at [facility] indicating patent                   left SFA stent. Performing Technologist: Alberta Lis RVS  Examination Guidelines: A complete evaluation includes B-mode imaging, spectral Doppler, color Doppler, and power Doppler as needed of all accessible portions of each vessel. Bilateral testing is considered an integral part of a complete examination. Limited examinations for reoccurring  indications may be performed as noted.  +----------+--------+-----+---------------+-------------------+--------+ RIGHT     PSV cm/sRatioStenosis       Waveform           Comments +----------+--------+-----+---------------+-------------------+--------+ CFA Prox  256                         biphasic                    +----------+--------+-----+---------------+-------------------+--------+ DFA       244                                                     +----------+--------+-----+---------------+-------------------+--------+ SFA Prox  276                                                     +----------+--------+-----+---------------+-------------------+--------+ SFA Mid   758          75-99% stenosis                            +----------+--------+-----+---------------+-------------------+--------+ SFA Distal82                          monophasic                  +----------+--------+-----+---------------+-------------------+--------+ POP Prox  41                          monophasic                  +----------+--------+-----+---------------+-------------------+--------+ POP Mid   37                          monophasic                  +----------+--------+-----+---------------+-------------------+--------+ POP Distal18                          monophasic                  +----------+--------+-----+---------------+-------------------+--------+  ATA Prox  15                          dampened monophasic         +----------+--------+-----+---------------+-------------------+--------+ ATA Mid   13                          dampened monophasic         +----------+--------+-----+---------------+-------------------+--------+ ATA Distal14                          dampened monophasic         +----------+--------+-----+---------------+-------------------+--------+ PTA Prox  35                          monophasic                   +----------+--------+-----+---------------+-------------------+--------+ PTA Mid   43                          monophasic                  +----------+--------+-----+---------------+-------------------+--------+ PTA Distal28                          monophasic                  +----------+--------+-----+---------------+-------------------+--------+  +----------+-------+-----+--------------+----------+---------------------------+ LEFT      PSV    RatioStenosis      Waveform  Comments                              cm/s                                                            +----------+-------+-----+--------------+----------+---------------------------+ CFA Prox  156                       biphasic                              +----------+-------+-----+--------------+----------+---------------------------+ DFA       146                       monophasic                            +----------+-------+-----+--------------+----------+---------------------------+ POP Prox  465         75-99%                  Distal SFA/proximal                               stenosis                popliteal                   +----------+-------+-----+--------------+----------+---------------------------+ POP Mid   15  monophasic                            +----------+-------+-----+--------------+----------+---------------------------+ POP Distal14                        monophasic                            +----------+-------+-----+--------------+----------+---------------------------+ ATA Prox  23                        monophasic                            +----------+-------+-----+--------------+----------+---------------------------+ ATA Mid   16                        monophasic                            +----------+-------+-----+--------------+----------+---------------------------+ ATA Distal16                         monophasic                            +----------+-------+-----+--------------+----------+---------------------------+  Left Stent(s): +---------------+--------+--------+-------------------+--------+ SFA            PSV cm/sStenosisWaveform           Comments +---------------+--------+--------+-------------------+--------+ Prox to Stent  92              monophasic                  +---------------+--------+--------+-------------------+--------+ Proximal Stent 57                                          +---------------+--------+--------+-------------------+--------+ Mid Stent      41                                          +---------------+--------+--------+-------------------+--------+ Distal Stent   38                                          +---------------+--------+--------+-------------------+--------+ Distal to Stent13              dampened monophasic         +---------------+--------+--------+-------------------+--------+    Summary: Right: Focal 75-99% stenosis noted in the mid superficial femoral artery. Left: 75-99% stenosis noted at the distal superficial femoral artery/proximal popliteal artery. Patent stent with no evidence of stenosis in the superficial femoral artery.  See table(s) above for measurements and observations. Electronically signed by Gaile New MD on 01/20/2024 at 9:02:19 AM.    Final    DG Tibia/Fibula Left Result Date: 01/18/2024 CLINICAL DATA:  Left lower leg pain for several hours, initial encounter EXAM: LEFT TIBIA AND FIBULA - 2 VIEW COMPARISON:  None Available. FINDINGS: No acute fracture or dislocation is noted. No soft tissue abnormality is seen. Diffuse vascular calcifications are  noted. IMPRESSION: No acute abnormality noted. Electronically Signed   By: Oneil Devonshire M.D.   On: 01/18/2024 19:07     TODAY-DAY OF DISCHARGE:  Subjective:   [name] today has no headache,no chest abdominal pain,no new weakness tingling or  numbness, feels much better wants to go home today.   Objective:   Blood pressure 123/65, pulse (!) 59, temperature 98.3 F (36.8 C), temperature source Oral, resp. rate 12, height 5' 3 (1.6 m), weight 46.5 kg, SpO2 94%.  Intake/Output Summary (Last 24 hours) at 02/01/2024 0813 Last data filed at 01/31/2024 2259 Gross per 24 hour  Intake 295.67 ml  Output 1000 ml  Net -704.33 ml   Filed Weights   01/29/24 1447  Weight: 46.5 kg    Exam: Awake Alert, Oriented *3, No new F.N deficits, Normal affect Hurt.AT,PERRAL Supple Neck,No JVD, No cervical lymphadenopathy appriciated.  Symmetrical Chest wall movement, Good air movement bilaterally, CTAB RRR,No Gallops,Rubs or new Murmurs, No Parasternal Heave +ve B.Sounds, Abd Soft, Non tender, No organomegaly appriciated, No rebound -guarding or rigidity. No Cyanosis, Clubbing or edema, No new Rash or bruise   PERTINENT RADIOLOGIC STUDIES: DG Foot 2 Views Left Result Date: 01/30/2024 CLINICAL DATA:  Postop. EXAM: LEFT FOOT - 2 VIEW COMPARISON:  Radiograph 01/28/2024 FINDINGS: Interval resection of the distal fifth metatarsal. The fifth toe remains in site 2. Interval resection of the second toe. Previous transmetatarsal amputation of the first ray. Expected postoperative changes in the soft tissues at the operative bed. The bones are subjectively under mineralized. IMPRESSION: Interval resection of the distal fifth metatarsal and second toe. Expected postoperative changes in the soft tissues. Electronically Signed   By: Andrea Gasman M.D.   On: 01/30/2024 16:34     PERTINENT LAB RESULTS: CBC: Recent Labs    01/31/24 0320 02/01/24 0341  WBC 17.1* 14.8*  HGB 8.1* 8.2*  HCT 25.7* 25.2*  PLT 397 386   CMET CMP     Component Value Date/Time   NA 139 01/31/2024 0320   NA 139 01/06/2024 1034   K 4.4 01/31/2024 0320   CL 108 01/31/2024 0320   CO2 21 (L) 01/31/2024 0320   GLUCOSE 89 01/31/2024 0320   BUN 30 (H) 01/31/2024 0320    BUN 49 (H) 01/06/2024 1034   CREATININE 1.65 (H) 01/31/2024 0320   CREATININE 0.57 (L) 03/14/2015 1230   CALCIUM 7.2 (L) 01/31/2024 0320   PROT 6.4 (L) 01/29/2024 0801   ALBUMIN  2.2 (L) 01/29/2024 0801   AST 38 01/29/2024 0801   ALT 39 01/29/2024 0801   ALKPHOS 102 01/29/2024 0801   BILITOT 0.7 01/29/2024 0801   EGFR 28 (L) 01/06/2024 1034   GFRNONAA 30 (L) 01/31/2024 0320   GFRNONAA 36 11/16/2022 1454    GFR Estimated Creatinine Clearance: 18.3 mL/min (A) (by C-G formula based on SCr of 1.65 mg/dL (H)). No results for input(s): LIPASE, AMYLASE in the last 72 hours. No results for input(s): CKTOTAL, CKMB, CKMBINDEX, TROPONINI in the last 72 hours. Invalid input(s): POCBNP No results for input(s): DDIMER in the last 72 hours. No results for input(s): HGBA1C in the last 72 hours. No results for input(s): CHOL, HDL, LDLCALC, TRIG, CHOLHDL, LDLDIRECT in the last 72 hours. No results for input(s): TSH, T4TOTAL, T3FREE, THYROIDAB in the last 72 hours.  Invalid input(s): FREET3 No results for input(s): VITAMINB12, FOLATE, FERRITIN, TIBC, IRON, RETICCTPCT in the last 72 hours. Coags: No results for input(s): INR in the last 72 hours.  Invalid input(s):  PT Microbiology: Recent Results (from the past 240 hours)  Blood Culture (routine x 2)     Status: None (Preliminary result)   Collection Time: 01/28/24  5:32 PM   Specimen: BLOOD  Result Value Ref Range Status   Specimen Description BLOOD SITE NOT SPECIFIED  Final   Special Requests   Final    BOTTLES DRAWN AEROBIC AND ANAEROBIC Blood Culture results may not be optimal due to an inadequate volume of blood received in culture bottles   Culture   Final    NO GROWTH 3 DAYS Performed at [facility], [address], [place], [place] [address]    Report Status PENDING  Incomplete  Resp panel by RT-PCR (RSV, Flu A&B, Covid) Anterior Nasal Swab     Status: None   Collection  Time: 01/28/24  6:36 PM   Specimen: Anterior Nasal Swab  Result Value Ref Range Status   SARS Coronavirus 2 by RT PCR NEGATIVE NEGATIVE Final   Influenza A by PCR NEGATIVE NEGATIVE Final   Influenza B by PCR NEGATIVE NEGATIVE Final    Comment: (NOTE) The Xpert Xpress SARS-CoV-2/FLU/RSV plus assay is intended as an aid in the diagnosis of influenza from Nasopharyngeal swab specimens and should not be used as a sole basis for treatment. Nasal washings and aspirates are unacceptable for Xpert Xpress SARS-CoV-2/FLU/RSV testing.  Fact Sheet for Patients: bloggercourse.com  Fact Sheet for Healthcare Providers: seriousbroker.it  This test is not yet approved or cleared by the [place]  FDA and has been authorized for detection and/or diagnosis of SARS-CoV-2 by FDA under an Emergency Use Authorization (EUA). This EUA will remain in effect (meaning this test can be used) for the duration of the COVID-19 declaration under Section 564(b)(1) of the Act, 21 U.S.C. section 360bbb-3(b)(1), unless the authorization is terminated or revoked.     Resp Syncytial Virus by PCR NEGATIVE NEGATIVE Final    Comment: (NOTE) Fact Sheet for Patients: bloggercourse.com  Fact Sheet for Healthcare Providers: seriousbroker.it  This test is not yet approved or cleared by the [place]  FDA and has been authorized for detection and/or diagnosis of SARS-CoV-2 by FDA under an Emergency Use Authorization (EUA). This EUA will remain in effect (meaning this test can be used) for the duration of the COVID-19 declaration under Section 564(b)(1) of the Act, 21 U.S.C. section 360bbb-3(b)(1), unless the authorization is terminated or revoked.  Performed at [facility], [address] [place], [place] [address]   Blood Culture (routine x 2)     Status: None (Preliminary result)   Collection Time:  01/28/24  9:52 PM   Specimen: BLOOD RIGHT HAND  Result Value Ref Range Status   Specimen Description BLOOD RIGHT HAND  Final   Special Requests   Final    BOTTLES DRAWN AEROBIC AND ANAEROBIC Blood Culture adequate volume   Culture   Final    NO GROWTH 3 DAYS Performed at [facility], [address], [place], [place] [address]    Report Status PENDING  Incomplete  Surgical PCR screen     Status: None   Collection Time: 01/29/24 10:30 PM   Specimen: Nasal Mucosa; Nasal Swab  Result Value Ref Range Status   MRSA, PCR NEGATIVE NEGATIVE Final   Staphylococcus aureus NEGATIVE NEGATIVE Final    Comment: (NOTE) The Xpert SA Assay (FDA approved for NASAL specimens in patients 56 years of age and older), is one component of a comprehensive surveillance program. It is not  intended to diagnose infection nor to guide or monitor treatment. Performed at [facility], [address], [place], [place] [address]   Aerobic/Anaerobic Culture w Gram Stain (surgical/deep wound)     Status: None (Preliminary result)   Collection Time: 01/30/24  1:22 PM   Specimen: Foot, Left; Amputation  Result Value Ref Range Status   Specimen Description TISSUE  Final   Special Requests LEFT FIFTH METATARSAL HEAD  Final   Gram Stain NO WBC SEEN NO ORGANISMS SEEN   Final   Culture   Final    NO GROWTH < 24 HOURS Performed at [facility], [address] [place], [place] [address]    Report Status PENDING  Incomplete    FURTHER DISCHARGE INSTRUCTIONS:  Get Medicines reviewed and adjusted: Please take all your medications with you for your next visit with your Primary MD  Laboratory/radiological data: Please request your Primary MD to go over all hospital tests and procedure/radiological results at the follow up, please ask your Primary MD to get all Hospital records sent to his/her office.  In some cases, they will be blood work, cultures and biopsy results pending at the time of your  discharge. Please request that your primary care M.D. goes through all the records of your hospital data and follows up on these results.  Also Note the following: If you experience worsening of your admission symptoms, develop shortness of breath, life threatening emergency, suicidal or homicidal thoughts you must seek medical attention immediately by calling 911 or calling your MD immediately  if symptoms less severe.  You must read complete instructions/literature along with all the possible adverse reactions/side effects for all the Medicines you take and that have been prescribed to you. Take any new Medicines after you have completely understood and accpet all the possible adverse reactions/side effects.   Do not drive when taking Pain medications or sleeping medications (Benzodaizepines)  Do not take more than prescribed Pain, Sleep and Anxiety Medications. It is not advisable to combine anxiety,sleep and pain medications without talking with your primary care practitioner  Special Instructions: If you have smoked or chewed Tobacco  in the last 2 yrs please stop smoking, stop any regular Alcohol  and or any Recreational drug use.  Wear Seat belts while driving.  Please note: You were cared for by a hospitalist during your hospital stay. Once you are discharged, your primary care physician will handle any further medical issues. Please note that NO REFILLS for any discharge medications will be authorized once you are discharged, as it is imperative that you return to your primary care physician (or establish a relationship with a primary care physician if you do not have one) for your post hospital discharge needs so that they can reassess your need for medications and monitor your lab values.  Total Time spent coordinating discharge including counseling, education and face to face time equals greater than 30 minutes.  SignedBETHA Donalda Applebaum 02/01/2024 8:13 AM

## 2024-02-01 NOTE — TOC Transition Note (Signed)
 Transition of Care [facility]) - Discharge Note   Patient Details  Name: [name] MRN: [id number] Date of Birth: 04-08-1941  [facility]) CM/SW Contact:  Marval Gell, RN Phone Number: 02/01/2024, 8:19 AM   Clinical Narrative:      Notified HH agency of DC, no further needs identified        Patient Goals and CMS Choice            Discharge Placement                       Discharge Plan and Services Additional resources added to the After Visit Summary for                                       Social Drivers of Health (SDOH) Interventions SDOH Screenings   Food Insecurity: No Food Insecurity (01/29/2024)  Housing: Low Risk  (01/29/2024)  Transportation Needs: No Transportation Needs (01/29/2024)  Utilities: Not At Risk (01/29/2024)  Depression (PHQ2-9): Low Risk  (12/13/2023)  Social Connections: Socially Integrated (01/29/2024)  Tobacco Use: Medium Risk (01/30/2024)     Readmission Risk Interventions    01/30/2024   11:57 AM 07/25/2023    3:58 PM 06/18/2022    1:55 PM  Readmission Risk Prevention Plan  Transportation Screening Complete Complete Complete  PCP or Specialist Appt within 5-7 Days  Complete   Home Care Screening  Complete   Medication Review (RN CM)  Complete   HRI or Home Care Consult   Complete  Social Work Consult for Recovery Care Planning/Counseling   Complete  Palliative Care Screening   Not Applicable  Medication Review [facility]) Referral to Pharmacy  Complete  PCP or Specialist appointment within 3-5 days of discharge Complete    HRI or Home Care Consult Complete    SW Recovery Care/Counseling Consult Complete    Palliative Care Screening Not Applicable    Skilled Nursing Facility Not Applicable

## 2024-02-01 NOTE — Progress Notes (Signed)
 Discharge   Patient expressed verbal understanding of discharge POC.   Patient given time to ask any questions.  Additional education included in AVS.  Alert oriented in good spirits.   Tele (monitor) and PIV removed.  Pressure dressings intact.  VSS.  TOC pharm brought meds at bedside.   Pt is fall risk.  Spouse on the way.  Discharge lounge notified.

## 2024-02-02 LAB — CULTURE, BLOOD (ROUTINE X 2)
Culture: NO GROWTH
Culture: NO GROWTH
Special Requests: ADEQUATE

## 2024-02-03 LAB — SURGICAL PATHOLOGY

## 2024-02-04 ENCOUNTER — Ambulatory Visit

## 2024-02-04 ENCOUNTER — Other Ambulatory Visit: Payer: Self-pay

## 2024-02-04 LAB — AEROBIC/ANAEROBIC CULTURE W GRAM STAIN (SURGICAL/DEEP WOUND)
Culture: NO GROWTH
Gram Stain: NONE SEEN

## 2024-02-05 MED ORDER — AMLODIPINE BESYLATE 5 MG PO TABS: 5.0000 mg | ORAL_TABLET | Freq: Every day | ORAL | 3 refills | Status: AC

## 2024-02-06 ENCOUNTER — Encounter: Payer: Self-pay | Admitting: Podiatry

## 2024-02-06 ENCOUNTER — Ambulatory Visit (INDEPENDENT_AMBULATORY_CARE_PROVIDER_SITE_OTHER): Admitting: Podiatry

## 2024-02-06 DIAGNOSIS — L97312 Non-pressure chronic ulcer of right ankle with fat layer exposed: Secondary | ICD-10-CM

## 2024-02-06 DIAGNOSIS — I739 Peripheral vascular disease, unspecified: Secondary | ICD-10-CM

## 2024-02-06 DIAGNOSIS — M869 Osteomyelitis, unspecified: Secondary | ICD-10-CM

## 2024-02-06 DIAGNOSIS — Z9889 Other specified postprocedural states: Secondary | ICD-10-CM

## 2024-02-06 NOTE — Progress Notes (Signed)
  Subjective:  Patient ID: [name], female    DOB: 06/21/1940,  MRN: [id number]  Chief Complaint  Patient presents with   Wound Check    Amputation second toe at MPJ level, left foot,fifth metatarsal head resection, left foot. 0. pain. Right ankle wound.      DOS: 01/30/24 Procedure: 1.  Amputation second toe at MPJ level, left foot 2. Fifth metatarsal head resection, left foot  83 y.o. female seen for post op check.  Patient now approximately 1 week out from both procedures.  Has chronic right ankle wound that is stable.  Complaining of swelling bilateral lower extremities.  She states she is down on her fluid pill from 2-1.  She has had left second toe amputation and left fifth metatarsal head resection.  Those sites are doing well with no issues.  Has left dressing clean dry and intact as instructed.  Walking postop shoe to the left foot in regular shoe to the right  Review of Systems: Negative except as noted in the HPI. Denies N/V/F/Ch.   Objective:   Constitutional Well developed. Well nourished.  Vascular Foot warm and well perfused. Capillary refill normal to all digits.   No calf pain with palpation  Neurologic Normal speech. Oriented to person, place, and time. Epicritic sensation diminished to left foot  Dermatologic Amputation site L 2nd toe and 5th met head resection well coapted no drainage no dehiscence or evidence necrosis/ residual infeciton  Right lateral ankle wound appears stable with fibrotic tissue in the wound bed no evidence of infection          Orthopedic: S/p L 2nd toe amputation at MPJ level, s/p L 5th metatarsal head resection.   Radiographs: Interval resection of the distal fifth metatarsal and second toe. Expected postoperative changes in the soft tissues.  Pathology:  A. TOE, LEFT SECOND, AMPUTATION:  Ulcer with necrosis and inflammation with underlying osteomyelitis.  Proximal osseous margin negative for osteomyelitis.   B. BONE,  LEFT FIFTH METATARSAL HEAD, MARGIN:  Benign bone.  Negative for osteomyelitis.    Micro: NGTD bone 5th met head  Assessment:   1. Osteomyelitis of second toe of left foot (HCC)   2. Ankle ulcer, right, with fat layer exposed (HCC)   3. PAD (peripheral artery disease) with history of multiple angioplasty and stent placement   4. Post-operative state    Osteomyelitis L 2nd toe and 5th metatarsal head S/p L 2nd toe amputation and resection 5th metatarsal head   Plan:  Patient was evaluated and treated and all questions answered.  1 week s/p above procedures -Progressing as expected post op, amputation sites healing well no drainage or dehiscence, she is having trouble with bilateral lower extremity edema think this is more related to fluid overload -XR: expected changes -WB Status: WBAT in post op shoe to left foot -Sutures: remain intact 1-2 weeks. -Medications/ABX: Status post course of doxycycline  on discharge, monitor off antibiotics at this time -Dressing: Changed today, leave dressing c/d/I until follow up next week - F/u Plan: Follow-up in 1 week for wound check possible suture removal.  I recommended that she discuss her bilateral lower extremity edema with her primary care doctor may need to adjust her diuretic medications.  I recommend elevation and compression for edema control.        [name], DPM [facility] / [facility]

## 2024-02-10 ENCOUNTER — Other Ambulatory Visit (HOSPITAL_COMMUNITY): Payer: Self-pay | Admitting: Nurse Practitioner

## 2024-02-10 DIAGNOSIS — R918 Other nonspecific abnormal finding of lung field: Secondary | ICD-10-CM

## 2024-02-13 ENCOUNTER — Ambulatory Visit (INDEPENDENT_AMBULATORY_CARE_PROVIDER_SITE_OTHER): Admitting: Podiatry

## 2024-02-13 VITALS — BP 155/59 | HR 92 | Temp 97.4°F

## 2024-02-13 DIAGNOSIS — Z9889 Other specified postprocedural states: Secondary | ICD-10-CM

## 2024-02-13 DIAGNOSIS — L97312 Non-pressure chronic ulcer of right ankle with fat layer exposed: Secondary | ICD-10-CM

## 2024-02-13 DIAGNOSIS — M869 Osteomyelitis, unspecified: Secondary | ICD-10-CM

## 2024-02-13 NOTE — Progress Notes (Signed)
  Subjective:  Patient ID: [name], female    DOB: 02-17-1941,  MRN: [id number]  Chief Complaint  Patient presents with   Routine Post Op    Amputation second toe at MPJ level left foot, Fifth metatarsal head resection left foot. Left foot wound. Feeling nauseated.Taking antibiotics A1C 5.2. on oxygen.     DOS: 01/30/24 Procedure: 1.  Amputation second toe at MPJ level, left foot 2. Fifth metatarsal head resection, left foot  83 y.o. female seen for post op check.  Patient now approximately 2 weeks out from above procedure.  Patient feeling nauseous today.  Taking antibiotics still.  Has left dressing on the left foot clean dry intact instructed.  Doing antibiotic ointment and gauze dressing change on the right ankle wound which is looking better  Review of Systems: Negative except as noted in the HPI. Denies N/V/F/Ch.   Objective:   Constitutional Well developed. Well nourished.  Vascular Foot warm and well perfused. Capillary refill normal to all digits.   No calf pain with palpation  Neurologic Normal speech. Oriented to person, place, and time. Epicritic sensation diminished to left foot  Dermatologic Amputation site L 2nd toe and 5th met head resection well coapted no drainage no dehiscence or evidence necrosis/ residual infeciton   Right lateral ankle wound appears stable with fibrotic tissue in the wound bed no evidence of infection   Orthopedic: S/p L 2nd toe amputation at MPJ level, s/p L 5th metatarsal head resection.   Radiographs: Interval resection of the distal fifth metatarsal and second toe. Expected postoperative changes in the soft tissues.  Pathology:  A. TOE, LEFT SECOND, AMPUTATION:  Ulcer with necrosis and inflammation with underlying osteomyelitis.  Proximal osseous margin negative for osteomyelitis.   B. BONE, LEFT FIFTH METATARSAL HEAD, MARGIN:  Benign bone.  Negative for osteomyelitis.    Micro: NGTD bone 5th met head  Assessment:   1.  Osteomyelitis of second toe of left foot (HCC)   2. Ankle ulcer, right, with fat layer exposed (HCC)   3. Post-operative state     Osteomyelitis L 2nd toe and 5th metatarsal head S/p L 2nd toe amputation and resection 5th metatarsal head   Plan:  Patient was evaluated and treated and all questions answered.  2 week s/p above procedures -Progressing as expected post op, amputation sites on the left foot continuing to improve no evidence of dehiscence or drainage no evidence of residual infection -Right ankle wound continues to improve as well continue with antibiotic ointment and dry gauze dressing change. -XR: expected changes -WB Status: WBAT in post op shoe to left foot, okay to transition back to regular shoe gear -Sutures: Removed today -Medications/ABX: Monitor off antibiotics at this time -Dressing: Will call [facility] to give orders for left foot wound care they can apply dry gauze dressing as needed okay to wash left foot at this time right ankle continue with antibiotic ointment and gauze dressing daily - F/u Plan: Follow-up in 2-week for wound check bilateral foot        [name], DPM [facility] / [facility]

## 2024-02-16 ENCOUNTER — Ambulatory Visit (HOSPITAL_COMMUNITY)
Admission: RE | Admit: 2024-02-16 | Discharge: 2024-02-16 | Disposition: A | Source: Ambulatory Visit | Attending: Nurse Practitioner | Admitting: Nurse Practitioner

## 2024-02-16 DIAGNOSIS — R918 Other nonspecific abnormal finding of lung field: Secondary | ICD-10-CM | POA: Insufficient documentation

## 2024-02-24 ENCOUNTER — Encounter (HOSPITAL_COMMUNITY): Payer: Self-pay | Admitting: Family Medicine

## 2024-02-24 ENCOUNTER — Other Ambulatory Visit (HOSPITAL_COMMUNITY): Payer: Self-pay | Admitting: Internal Medicine

## 2024-02-24 DIAGNOSIS — R1031 Right lower quadrant pain: Secondary | ICD-10-CM

## 2024-02-24 DIAGNOSIS — R1903 Right lower quadrant abdominal swelling, mass and lump: Secondary | ICD-10-CM

## 2024-02-25 ENCOUNTER — Other Ambulatory Visit (HOSPITAL_COMMUNITY): Payer: Self-pay | Admitting: Internal Medicine

## 2024-02-25 ENCOUNTER — Ambulatory Visit (HOSPITAL_COMMUNITY)
Admission: RE | Admit: 2024-02-25 | Discharge: 2024-02-25 | Disposition: A | Discharge status: Home / Self Care | Source: Ambulatory Visit | Attending: Internal Medicine | Admitting: Internal Medicine

## 2024-02-25 DIAGNOSIS — R1031 Right lower quadrant pain: Secondary | ICD-10-CM | POA: Insufficient documentation

## 2024-02-25 DIAGNOSIS — R1903 Right lower quadrant abdominal swelling, mass and lump: Secondary | ICD-10-CM

## 2024-02-27 ENCOUNTER — Ambulatory Visit (INDEPENDENT_AMBULATORY_CARE_PROVIDER_SITE_OTHER): Admitting: Podiatry

## 2024-02-27 DIAGNOSIS — Z9889 Other specified postprocedural states: Secondary | ICD-10-CM

## 2024-02-27 DIAGNOSIS — L97312 Non-pressure chronic ulcer of right ankle with fat layer exposed: Secondary | ICD-10-CM

## 2024-02-27 DIAGNOSIS — M869 Osteomyelitis, unspecified: Secondary | ICD-10-CM

## 2024-02-27 NOTE — Progress Notes (Signed)
  Subjective:  Patient ID: [name], female    DOB: 14-Apr-1940,  MRN: [id number]  Chief Complaint  Patient presents with   Wound Check    Amputation second toe at MPJ level left foot. Fifth metatarsal head resection, left foot . Right foot lateral ankle wound. 0 pain.      DOS: 01/30/24 Procedure: 1.  Amputation second toe at MPJ level, left foot 2. Fifth metatarsal head resection, left foot  83 y.o. female seen for post op check.  Patient now approximately 4 weeks out from above procedure.  Patient is feeling better today.  Reports that she has had home health coming out for wound care and both sides are improving.  Review of Systems: Negative except as noted in the HPI. Denies N/V/F/Ch.   Objective:   Constitutional Well developed. Well nourished.  Vascular Foot warm and well perfused. Capillary refill normal to all digits.   No calf pain with palpation  Neurologic Normal speech. Oriented to person, place, and time. Epicritic sensation diminished to left foot  Dermatologic Amputation site L 2nd toe and 5th met head resection are now healed       Right lateral ankle wound appears improved from prior with increased in fill healthy tissue decreased erythema surrounding    Orthopedic: S/p L 2nd toe amputation at MPJ level, s/p L 5th metatarsal head resection.   Radiographs: Interval resection of the distal fifth metatarsal and second toe. Expected postoperative changes in the soft tissues.  Pathology:  A. TOE, LEFT SECOND, AMPUTATION:  Ulcer with necrosis and inflammation with underlying osteomyelitis.  Proximal osseous margin negative for osteomyelitis.   B. BONE, LEFT FIFTH METATARSAL HEAD, MARGIN:  Benign bone.  Negative for osteomyelitis.    Micro: NGTD bone 5th met head  Assessment:   1. Osteomyelitis of second toe of left foot (HCC)   2. Ankle ulcer, right, with fat layer exposed (HCC)   3. Post-operative state      Osteomyelitis L 2nd toe and 5th  metatarsal head S/p L 2nd toe amputation and resection 5th metatarsal head   Plan:  Patient was evaluated and treated and all questions answered.  4 week s/p above procedures -Progressing as expected post op, amputation sites on the left foot continuing to improve and are now nearly fully healed -Right ankle wound continues to improve as well continue with antibiotic ointment and dry gauze dressing change. -XR: expected changes -WB Status: WBAT in post op shoe to left foot, okay to transition back to regular shoe gear -Sutures: Removed today -Medications/ABX: Monitor off antibiotics at this time -Dressing: Continue with antibiotic ointment and dry gauze dressing to the right lateral ankle wound which is improving.  No further dressings required in the left foot okay to wash left foot and apply lotion. - F/u Plan: Follow-up in 3-week for wound check bilateral foot        [name], DPM [facility] / [facility]

## 2024-03-13 ENCOUNTER — Other Ambulatory Visit: Payer: Self-pay | Admitting: Cardiovascular Disease

## 2024-03-18 ENCOUNTER — Ambulatory Visit: Admitting: Internal Medicine

## 2024-03-18 ENCOUNTER — Encounter: Payer: Self-pay | Admitting: Internal Medicine

## 2024-03-18 VITALS — BP 106/64 | HR 83 | Temp 97.7°F | Ht 62.0 in | Wt 82.0 lb

## 2024-03-18 DIAGNOSIS — J449 Chronic obstructive pulmonary disease, unspecified: Secondary | ICD-10-CM | POA: Diagnosis not present

## 2024-03-18 DIAGNOSIS — R911 Solitary pulmonary nodule: Secondary | ICD-10-CM

## 2024-03-18 MED ORDER — ALBUTEROL SULFATE HFA 108 (90 BASE) MCG/ACT IN AERS: 2.0000 | INHALATION_SPRAY | Freq: Four times a day (QID) | RESPIRATORY_TRACT | 2 refills | Status: AC | PRN

## 2024-03-18 NOTE — Progress Notes (Signed)
 OV 11/22/2015  Chief Complaint  Patient presents with   Follow-up    Pt states that she tries to take the Sprivia Resp daily - sometimes she forgets. Denies current breathing issues. Pt states that she does not have to use the Albuterol  HFA.    83 year old female former smoker with significant neuromuscular disability due to prior stroke, spinal issues.    STUDIES:   CT chest (05/2014) Masslike area of consolidation RLL, r hilar and mediastinal LAN CT chest (06/2014) Significant improvement of RLL opacification, cavitary lesion persists suggestive of resolving PNA with necrosis.   CT chest (09/2014) Near complete collapse of the right lower lobe cavitary lesion with residual pleural-parenchymal thickening. New fine ground-glass nodules in the right middle lobe consistent with pulmonary infection. CT chest (02/15/2015) Recurrent to rounded masslike area of consolidation in the right lower lobe. Given that this area was present previously then resolved and has recurrent quickly, this is most compatible with rounded pneumonia. Small right pleural effusion with adjacent right lower lobe atelectasis or consolidation. Mildly enlarged precarinal lymph node, likely reactive. CT chest 04/13/15 >Interval improvement in masslike consolidation in the right lower lobe, without complete resolution, favoring resolving pneumonia. 2. Locule of air at the lateral base of the right hemi thorax may be within necrotic lung. Bronchopleural fistula cannot be definitively excluded. 3. Small, partially loculated right pleural effusion. 4. 4 mm left lower lobe nodule, stable.    PCP [name], MD REferrd by dr Delford HPI  IOV 07/09/2014  Chief Complaint  Patient presents with   Pulmonary Consult    Pt referred by Dr. Delford fpr abnormal CT. Pt denies SOB, cough, and CP/tightness. Pt stated she went to hospital for pna on 05/20/14.     83 year old female with significant neuromuscular disability due to  prior stroke, spinal issues. Previous greater than 50 pack smoker. Was admitted around 05/20/2014 with significant right lower lobe pneumonia/huge masslike appearance on CT scan of the chest. Apparently she was extensively asymptomatic at this point in time. Treated with antibiotics. She had follow-up CT scan of the chest which shows significant improvement in the right lower lobe opacity but with a residual 5.2 cm thick-walled cavitary lesion. There for she's here for follow-up. At baseline she reports no dyspnea but then she is extremely disabled and is only able to move a little bit with pushing a wheelchair and this is because of a spinal issues. She only rarely feels short of breath and manages with pro-air when necessary. She's never had lung function test in the past. She denies any cough. Currently she feels better     OV 09/29/2014  Chief Complaint  Patient presents with   Follow-up    Pt here after CT scan. Pt states her breathing is unchanged since last OV. Pt has left ankle edema dt recent sprain, pt is seeing PCP for the issue.     Follow-up   - Right lower lobe lung cavity following pneumonia February 2016: She had CT scan of the chest June 2016. This showed near resolution of the cavity and it is down to a scar tissue. I personally visualized image dated 09/08/2014  - New issue: CT scan of the chest 09/08/2014 compared to CT scans of the chest March 2016 show some new right middle lobe groundglass nodules that are extremely small. She denies she was sick at that time  - Other issue: History of smoking with shortness of breath: She denies any shortness  of breath or cough but on deeper questioning she does admit to occasional shortness of breath that is very rare. She uses pro-air may be once every 2 weeks. She feels good. She's never had pulmonary function testing. She has a history of 56 pack smoking history but is quit. She does not want to pulmonary function test now but will do  it in the future   06/03/2015 Acute OV : Cavitary PNA  Pt presents for work in visit. Was seen 1 month ago . She is recovering from recurrent cavitary PNA on the right.  Previous episode in 05/2014 with right sided mass like consolidation. She was treated with abx. Serial CT showed near complete resolution on CT chest 09/2014.  Admitted in Nov 2016 with acute GI bleed and cavitary lung lesion. EGD showed a normal esophagus and gastritis with numerous clean, linear ulcers of the stomach and a few ulcers of the duodenal bulb. She did stabilize and was transitioned back on Eliquis  5mg  Twice daily   Prior to discharge CT chest showed a recurrent rounded masslike area of consolidation on the right lower lobe measuring 9.5 x 6.7 cm.  She was treated with aggressive ABX . Follow up CT chest in Jan showed interval improvement in RLL consolidation. Clinically she has been slowly improving.  She is very frail and has previous stroke years ago. She is on chronic pain meds as well for back issues.  She denies any swallow issues but does admit she eats in bed often. Says this week has not felt as good  Says that her right side was hurting when she lied down in bed and not as much energy. Feels much better today. CXR today shows further improvement in RLL opacity . No new areas noted.  She denies any bleeding , n/v/d , fever or chest pain. , orthopnea, hemoptysis , or edema.  Has ov with PCP next week for labs.  Last labs last month w/ improved H/H .    OV 07/12/2015  Chief Complaint  Patient presents with   Follow-up    Pt last seen on 06/03/15 for pna. Pt states she is feeling well now. Pt deines SOB, cough, wheezing, CP/tightness, f/c/s.     Follow-up recurrent pneumonia. Most recently 6 weeks ago she saw nurse practitioner. She says she is now recovered from respiratory infection. She and her husband state that she gets recurrent respiratory infection and is looking at ways to prevent it. Pulmonary  function test today shows gold stage II COPD. CT scan chest as a follow-up from pneumonia 2 years ago shows that things have resolved to scar tissue. There is no lung mass. Overall she's asymptomatic from a respiratory standpoint at baseline but then she is largely disabled because of spinal issues and does not exert much.  CT and PFT were personally visualized  Pulmonary function test 07/08/2015 FEV1 1.44 L/70%. No post bronchodilator response. FVC 2.1 L/76% and a ratio 69 consistent with Gold stage II COPD. Total lung capacity is 98%. DLCO is reduced at 11.33/46%  Ct Chest Wo Contrast  07/11/2015  CLINICAL DATA:  Lung nodules. EXAM: CT CHEST WITHOUT CONTRAST TECHNIQUE: Multidetector CT imaging of the chest was performed following the standard protocol without IV contrast. COMPARISON:  CT chest exams dating back to 01/15/2008. FINDINGS: Mediastinum/Nodes: Sub cm low-attenuation lesion in the right lobe of the thyroid  is incidentally noted. Mediastinal lymph nodes are not enlarged by CT size criteria. Hilar regions are difficult to definitively evaluate without IV  contrast. No axillary adenopathy. Surgical clips in the left axilla. Atherosclerotic calcification of the arterial vasculature. Dense calcification and narrowing of the right brachiocephalic artery. Three-vessel coronary artery calcification. Heart size normal. No pericardial effusion. Small hiatal hernia. Calcified subcarinal lymph node. Lungs/Pleura: Right apical pleural parenchymal scarring. Mild centrilobular emphysema. Post infectious scarring in the right middle and right lower lobes. A few scattered pulmonary nodules measure 4 mm or less in size, unchanged. Some are calcified. No pleural fluid. Airway is unremarkable. Upper abdomen: Visualized portions of the liver, adrenal glands unremarkable. Probable renal vascular calcifications bilaterally. Difficult to exclude renal stones as well. Visualized portions of the spleen, pancreas and stomach  are grossly unremarkable. Musculoskeletal: Degenerative changes are seen in the spine. No worrisome lytic or sclerotic lesions. Lower thoracic compression fractures, as before. IMPRESSION: 1. Post infectious scarring in the right middle and right lower lobes. 2. Three-vessel coronary artery calcification. 3. Advanced atherosclerotic calcification and narrowing of the proximal right brachiocephalic artery. Electronically Signed   By: Newell Eke M.D.   On: 07/11/2015 11:25    OV 11/22/2015  Chief Complaint  Patient presents with   Follow-up    Pt states that she tries to take the Sprivia Resp daily - sometimes she forgets. Denies current breathing issues. Pt states that she does not have to use the Albuterol  HFA.     Follow-up Gold stage II COPD in the setting of neuromuscular issues due to spine and stroke. Currently on Spiriva .she is here with her husband. Overall she's doing well. She never uses albuterol  for rescue since her last visit. No interim exacerbations or urgent care visits. Sleeps well. She's not interested in pulmonary rehabilitation. She continues to Spiriva . She takes flu shot in the fall and is willing to do so again.    has a past medical history of Anemia (05/2014.); Atrial fibrillation (HCC); Breast cancer (HCC); Carotid artery occlusion; Carotid bruit; Degenerative arthritis of spine (2015); Hyperlipidemia; Hypertension; Meningioma (HCC); Osteoporosis; Pneumonia (May 19, 2014); Stroke [facility]); Subclavian steal syndrome; and Ulcers of both lower extremities (HCC) (2015).   reports that she quit smoking about 9 years ago. Her smoking use included Cigarettes. She has a 56.00 pack-year smoking history. She has never used smokeless tobacco.   OV 06/27/2016  Chief Complaint  Patient presents with   Follow-up    pt states she is doing well at this time, denies any breathing complaints.     Follow-up moderate COPD on single agent Spiriva . Last seen August 2017. She is here  with her husband. In the interim no COPD exacerbations of prednisone use or antibody use or admissions to the hospital emergency room visits for any reason. She is followed up with the gastroenterologist and neurologist for previous histories of atherosclerosis and GI bleeds. She is here with her husband. She did admit that she is not fully compliant with the Spiriva  but she stable.   OV 07/11/2017  Chief Complaint  Patient presents with   Follow-up    1-year follow up.  Pt stated she fell 05/10/17 and was off of her feet x6 weeks. Pt has SOB with exertion. States she has been noncompliant when it comes to using her inhalers or neb machine.     Follow-up moderate COPD on single agent Spiriva  and a wheelchair-bound female  There is a 1 year follow-up.  Since her last visit overall stable but earlier this year had hairline fracture in her left tibia.  And was then bedbound and also wheelchair-bound.  She has a brace.  She has had routine cardiology follow-up.  In terms of her respiratory status she is stable.  However she says she missed taking her Spiriva  inhalers for many months.  She does get some exertional dyspnea but only when there is too much exertion but for because she is wheelchair-bound and bedbound she does not feel it.  But she does recognize the need to take Spiriva  on a regular basis.  She is up-to-date with her vaccines.   .   OV 12/22/2019  Subjective:  Patient ID: [name], female , DOB: 01-29-1941 , age 83 y.o. , MRN: [id number] , ADDRESS: [address] [place] [facility] [id number]   12/22/2019 -   Chief Complaint  Patient presents with   Follow-up    breathing about the same     HPI [name] 83 y.o. -Personally not seen her in 2 years.  She is on Spiriva .  Last visit was in 2019 with me.  Then in 2020 she saw nurse practitioner.  She continues to do well on Spiriva .  COPD CAT score is 3 and improved.  Her husband is here with her.  They both expressed that Spiriva   sometimes can be very expensive when she runs into the donut hole and the cost is few $100.  They asked advice about how to handle this.  They also have questions about whether they should get a COVID-19 booster third shot.  She got vaccine second dose in February 2021 mRNA vaccine.  She also plans to have the flu shot with her primary care physician.  She knows that she needs to get the high-dose flu shot.    IMPRESSION: CT  1. Post infectious scarring in the right middle and right lower lobes. 2. Three-vessel coronary artery calcification. 3. Advanced atherosclerotic calcification and narrowing of the proximal right brachiocephalic artery.     Electronically Signed   By: Newell Eke M.D.   On: 07/11/2015 11:25    12/22/2019: OV with Dr. Geronimo.  Doing well on Spiriva ; however concerned that it can be quite expensive when she runs into the donut hole.  She was provided with 2 samples and advised to call in the future if she had any issues with affordability.  Discussed getting third COVID booster, which she was encouraged to do so given her long history.  10/25/2021: OV with Cobb NP for overdue follow-up.  She reports that she has been feeling well.  She has no concerns or complaints regarding her breathing.  She really does not notice any significant shortness of breath.  No limits in her daily activities.  Her husband reports that she walks laps around him at the grocery store.  She is on Spiriva  daily, which works well for her.  She never uses her albuterol  inhaler.  She has not required prednisone or antibiotics for acute exacerbations of her breathing.  04/27/2022: Today - follow up Patient presents today for 50-month follow-up.  She was recently admitted 03/05/2022 to the hospital for sepsis secondary to left foot cellulitis.  She was treated with IV vancomycin  and ceftriaxone  and transition to doxycycline  and Augmentin  to complete a 10-day course.  She was discharged home on  03/09/2022 after clinically improving.  Today, she tells me that she is feeling much better.  Feels like her breathing is at her baseline.  No significant concerns or complaints.  She does not have any cough, associated wheezing, or chest congestion.  Her insurance notified her that  they will no longer be covering her Spiriva .  She needs an alternative inhaler for this.  Rare use of albuterol .    OV 08/21/2022  Subjective:  Patient ID: [name], female , DOB: Jun 07, 1940 , age 19 y.o. , MRN: [id number] , ADDRESS: [address] [place] [facility] [id number] PCP [name], MD Patient Care Team: [name], MD as PCP - General (Internal Medicine) Delford Maude BROCKS, MD as PCP - Cardiology (Cardiology) Delford Maude BROCKS, MD (Cardiology) Brenna Lin, MD as Consulting Physician (Orthopedic Surgery) Rachelle Krabbe, PT (Inactive) as Physical Therapist (Physical Therapy)  This Provider for this visit: Treatment Team:  Attending Provider: Geronimo Amel, MD    08/21/2022 -   Chief Complaint  Patient presents with   Follow-up    F/up on CT scan    HPI [name] 83 y.o. -personally not seen her in some years.  After that earlier this year she saw a nurse practitioner.  However after that she has had 3 admissions in 2024.  The first 1 was in February 2024 for acute heart failure with preserved ejection fraction exacerbation.  Then the second 1 mid March 2024 for sepsis and acute respiratory failure with hypoxemia.  During this admission the CT scan of the chest that I personally visualized did show bilateral effusions and also consolidation.  However she did have a left lower lobe cavity that was new compared to 2017.  Then in April 2024 again another admission for acute on chronic heart failure with reduced ejection fraction.  Currently she is euvolemic.  However she is on Eliquis  and amiodarone  for atrial fibrillation and she fell last week and bruised her entire left face and even the  left chest.  She is here with her husband.  He is an independent historian.  They tell me right now respiratory status is stable but the dealing with vascular ischemia of the lower extremities.  She had a CT scan of the chest Aug 16, 2022 that I personally visualized.  The effusions have resolved but there is a left lower lobe cavity that is somewhat thick-walled.  There is the same cavity that was seen in March 2024 and is unchanged.  It is definitely new from years prior.  She is here to discuss this.  COPD itself she feels is stable.  Her husband is hard of hearing.  Both of them needed repeated explanation.  She did indicate to me that she is absolutely not interested in lung biopsy..  I did indicate to them these nodules very likely early stage lung cancer but other differential diagnosis could be involved.  She does not want lung biopsy.  She also does not want lobectomy.  We discussed radiation therapy for lung cancer she is willing to have this.  We talked about doing additional investigations that are noninvasive is way to enhance the pretest probability for lung cancer.  This involves blood test for QuantiFERON gold vasculitis and PET scan and also research protocol involving a nasal swab for genomic classifier for lung cancer.  She is interested in all this.  Gave her the copy of the consent form.  Other than that she did indicate that she has anxiety about an upcoming MRI.  She wanted to know if she could take an anxiolytic.  I advised her that she can take a low-dose Xanax .  Her FEV1 a few years ago was acceptable.    Narrative & Impression  CLINICAL DATA:  Follow-up abnormal chest  x-ray   EXAM: CT CHEST WITHOUT CONTRAST   TECHNIQUE: Multidetector CT imaging of the chest was performed following the standard protocol without IV contrast.   RADIATION DOSE REDUCTION: This exam was performed according to the departmental dose-optimization program which includes automated exposure control,  adjustment of the mA and/or kV according to patient size and/or use of iterative reconstruction technique.   COMPARISON:  CT chest, abdomen and pelvis dated June 18, 2022; chest x-ray dated July 09, 2022   FINDINGS: Cardiovascular: Cardiomegaly. No pericardial effusion. Normal caliber thoracic aorta with severe calcified plaque. Severe left main and three-vessel coronary artery calcifications.   Mediastinum/Nodes: Moderate hiatal hernia. Esophagus is unremarkable. No enlarged lymph nodes seen in the chest. Mildly enlarged right lower paratracheal lymph node measuring 12 mm on series 2, image 60, unchanged when compared with the prior exam.   Lungs/Pleura: Central airways are patent. Linear opacity of the right lung apex, consistent with pleural-parenchymal scarring. Moderate centrilobular emphysema. Mild diffuse ground-glass opacities and septal thickening. Solid cavitary nodule of the left lower lobe measuring 12 mm on series 4, image 109, unchanged when compared with the prior exam. Additional small scattered solid pulmonary nodules are seen, example 5 mm nodule of the superior portion of the right lower lobe located on image 79, unchanged when compared with the prior. Scattered basilar linear opacities which are likely due to scarring or atelectasis. Trace left pleural effusion.   Upper Abdomen: No acute abnormality.   Musculoskeletal: Severe compression deformities of T10 and T12 with retropulsion, unchanged when compared with the prior exam. No acute osseous abnormality.   IMPRESSION: 1. Persistent cavitary nodule of the left lower lobe, concerning for primary lung malignancy. Additional imaging evaluation or consultation with Pulmonology or Thoracic Surgery is recommended. 2. Mild pulmonary edema and trace left pleural effusion. 3. Cardiomegaly with severe left main and three-vessel coronary artery calcifications. 4. Moderate hiatal hernia. 5. Aortic Atherosclerosis  (ICD10-I70.0) and Emphysema (ICD10-J43.9).     Electronically Signed   By: Rea Marc M.D.   On: 08/17/2022 10:29    OV 11/02/2022  Subjective:  Patient ID: [name], female , DOB: 06-11-1940 , age 36 y.o. , MRN: [id number] , ADDRESS: [address] [place] [facility] [id number] PCP [name], MD Patient Care Team: [name], MD as PCP - General (Internal Medicine) Delford Maude BROCKS, MD as PCP - Cardiology (Cardiology) Delford Maude BROCKS, MD (Cardiology) Brenna Lin, MD as Consulting Physician (Orthopedic Surgery) Rachelle Krabbe, PT (Inactive) as Physical Therapist (Physical Therapy)  This Provider for this visit: Treatment Team:  Attending Provider: Geronimo Amel, MD    11/02/2022 -   Chief Complaint  Patient presents with   Follow-up    F/up, no new complaints.      HPI FATIMA FEDIE 83 y.o. -presents for follow-up.  Presents with her husband.  #COPD: This is stable she is on Incruse no complaints  #Left lower lobe cavitary nodule: This appears new in March 2024.  PET scan in June 2024 showed it was persistent with very low uptake.  Follow-up CT scan is recommended but she has refused.  She does not want to know what the diagnosis is.  She knows it might be early stage lung cancer.  She does not want radiation or chemo even if the side effects are low.  She says she has had a good life and she is thankful she has a great husband.  Therefore she does not even want follow-up CT.  Other issues - Nonhealing wound right ankle:'s been going on and off for 3 months.  She has wound home care. -She had admission in April 2024 for acute on chronic heart failure with reduced ejection fraction. -Also July 2024 critical limb ischemia addressed by Dr. Serene    NM PET Scan June 2024   IMPRESSION: 11 mm cavitary lesion in the left lower lobe, non FDG avid. While this may be infectious/inflammatory, a low-grade neoplasm is not excluded. Follow-up CT chest is  suggested in 3-6 months.   No findings suspicious for metastatic disease.     Electronically Signed   By: Pinkie Pebbles M.D.   On: 09/12/2022 00:05   OV 09/17/2023  Subjective:  Patient ID: [name], female , DOB: 1940/08/09 , age 67 y.o. , MRN: [id number] , ADDRESS: [address] [place] [facility] [id number] PCP [name], MD Patient Care Team: [name], MD as PCP - General (Internal Medicine) Delford Maude BROCKS, MD as PCP - Cardiology (Cardiology) Delford Maude BROCKS, MD (Cardiology) Brenna Lin, MD as Consulting Physician (Orthopedic Surgery) Rachelle Krabbe, PT (Inactive) as Physical Therapist (Physical Therapy)  This Provider for this visit: Treatment Team:  Attending Provider: Geronimo Amel, MD  09/17/2023 -   Chief Complaint  Patient presents with   Follow-up    Follow up 1 year  for copd . Pt stated that she is wear oxygen at night only 2L.     HPI [name] 83 y.o. -returns for follow-up.  Last seen in July 2024.  Since then she has had multiple admissions including August 2024 for heart failure December 2020 for sepsis and then in April 2025 for respiratory failure secondary to pneumonia and pulmonary edema.  In the most recent admission when they did a CT scan of the chest they found the lung nodule had enlarged along with concern for mediastinal adenopathy.  She again states that she is ready to pass away and she enjoying her quite current quality of life.  She does not want any follow-up CT scan or radiation treatment or biopsies.  She is content with her life.  Husband also agrees to this he is here with her today and is an independent historian.  She is to have a nonhealing wound ulcer on the right ankle lateral aspect but this is now healing well.  Since Easter 2025 no hospitalizations no ER visits no surgeries.  She is now using oxygen at night at home.  She is on Incruse.    OV 03/18/2024  Subjective:  Patient ID: [name], female , DOB:  [birth year] , age 45 y.o. , MRN: [id number] , ADDRESS: [address] [place] [facility] [id number] PCP [name], MD Patient Care Team: [name], MD as PCP - General (Internal Medicine) Delford Maude BROCKS, MD as PCP - Cardiology (Cardiology) Delford Maude BROCKS, MD (Cardiology) Brenna Lin, MD (Inactive) as Consulting Physician (Orthopedic Surgery) Rachelle Krabbe, PT (Inactive) as Physical Therapist (Physical Therapy)  This Provider for this visit: Treatment Team:  Attending Provider: Geronimo Amel, MD    03/18/2024 -   Chief Complaint  Patient presents with   COPD    Pt states since LOV breathing has been okay, states she fines her self SOB more than usual SOB occurs w/ any activity Dry cough occurs sometimes     #Former smoker #Moderate COPD  #Frail with lean BMI. #11 mm left lower lobe cavitary nodule -new March 2024 and persistent but PET -  June 2024  - April 2025 worsening of the nodule to greater than 2 cm associate with concern for mediastinal adenopathy.  - Nov 2025 WORSE #Admission  - April 2024 with CODE STATUS DNR. -August 2024 for acute on chronic systolic and diastolic heart failure requiring IV diuresis. -December 2024 for sepsis - April 2025 for acute respiratory failure secondary to pneumonia and pulmonary edema.   HPI [name] 83 y.o. -[name] is an 83 year old female who presents with for copd and cavityr lung nodule. She has frailty and deconditoning. Prsents with husband.   COPD: She has been experiencing shortness of breath, which led to two hospitalizations approximately three weeks ago. She uses oxygen at night and carries a portable oxygen tank when going out, although she rarely needs to use it. She uses albuterol  as needed for relief when experiencing shortness of breath or wheezing. She is currently taking Incruse and has questions about her medication regimen. She wants scale up  Lung nodule: She is aware of a potential cancer  diagnosis, noting that a scan from November showed growth. She refuses work up  Other issues - She is scheduled for a blood test to check her white blood cell count and hemoglobin levels, which were previously noted to be low.  - She describes feeling physically weak and has difficulty regaining her strength. She is currently receiving home physical therapy and wound care for her ankle.  - She mentions an incident where her ear started bleeding, which was assessed by a home care nurse who suggested it might be due to scratching. She is unable to hear out of her left ear.    CT Chest data from date: Nov 2025  - personally visualized and independently interpreted : no - my findings are: as below  IMPRESSION: 1. 2.1 cm thick-walled cavitary lesion in the left lower lobe, mildly progressive, concerning for primary bronchogenic carcinoma, specifically squamous cell lung cancer. 2. Small mediastinal nodes, unchanged. 3. Moderate left and small right pleural effusions, similar to prior.   Electronically signed by: Pinkie Pebbles MD 02/19/2024 02:05 AM EST RP Workstation: [id number]    PFT     Latest Ref Rng & Units 07/08/2015    7:53 AM  PFT Results  FVC-Pre L 2.09   FVC-Predicted Pre % 76   FVC-Post L 1.96   FVC-Predicted Post % 71   Pre FEV1/FVC % % 69   Post FEV1/FCV % % 67   FEV1-Pre L 1.44   FEV1-Predicted Pre % 70   FEV1-Post L 1.31   DLCO uncorrected ml/min/mmHg 11.33   DLCO UNC% % 46   DLVA Predicted % 73   TLC L 4.96   TLC % Predicted % 98   RV % Predicted % 135        LAB RESULTS last 96 hours No results found.       has a past medical history of Anemia (05/2014), Atrial fibrillation (HCC), Breast cancer [facility]), Carotid artery occlusion, Carotid bruit, Degenerative arthritis of spine (2015), Hyperlipidemia, Hypertension, Meningioma (HCC), Osteoporosis, Peripheral arterial disease, Pneumonia (05/19/2014), Stroke [facility]), Subclavian steal syndrome, and Ulcers  of both lower extremities (HCC) (2015).   reports that she quit smoking about 17 years ago. Her smoking use included cigarettes. She started smoking about 73 years ago. She has a 56 pack-year smoking history. She has never been exposed to tobacco smoke. She has never used smokeless tobacco.  Past Surgical History:  Procedure Laterality Date   ABDOMINAL  AORTOGRAM W/LOWER EXTREMITY N/A 12/19/2021   Procedure: ABDOMINAL AORTOGRAM W/LOWER EXTREMITY;  Surgeon: Serene Gaile ORN, MD;  [facility];  Service: Cardiovascular;  Laterality: N/A;   ABDOMINAL AORTOGRAM W/LOWER EXTREMITY Bilateral 08/28/2022   Procedure: ABDOMINAL AORTOGRAM W/LOWER EXTREMITY;  Surgeon: Serene Gaile ORN, MD;  [facility];  Service: Cardiovascular;  Laterality: Bilateral;   ABDOMINAL AORTOGRAM W/LOWER EXTREMITY N/A 10/16/2022   Procedure: ABDOMINAL AORTOGRAM W/LOWER EXTREMITY;  Surgeon: Serene Gaile ORN, MD;  [facility];  Service: Cardiovascular;  Laterality: N/A;   ABDOMINAL AORTOGRAM W/LOWER EXTREMITY N/A 02/26/2023   Procedure: ABDOMINAL AORTOGRAM W/LOWER EXTREMITY;  Surgeon: Serene Gaile ORN, MD;  [facility];  Service: Cardiovascular;  Laterality: N/A;   ABDOMINAL AORTOGRAM W/LOWER EXTREMITY N/A 07/16/2023   Procedure: ABDOMINAL AORTOGRAM W/LOWER EXTREMITY;  Surgeon: Serene Gaile ORN, MD;  [facility];  Service: Cardiovascular;  Laterality: N/A;   AMPUTATION TOE Left 12/29/2021   Procedure: Incisional DEBRIDEMENT Left Foot with Amputation Left Great Toe;  Surgeon: Tobie Franky SQUIBB, DPM;  [facility];  Service: Podiatry;  Laterality: Left;   AMPUTATION TOE Left 01/30/2024   Procedure: AMPUTATION LEFT SECOND TOE;  Surgeon: Malvin Marsa FALCON, DPM;  [facility];  Service: Orthopedics/Podiatry;  Laterality: Left;   CARDIAC CATHETERIZATION     CATARACT EXTRACTION Bilateral 2013   COLONOSCOPY  11/17/2010   Procedure: COLONOSCOPY;  Surgeon:  Claudis RAYMOND Rivet, MD;  [facility];  Service: Endoscopy;  Laterality: N/A;  10:45 am   ESOPHAGOGASTRODUODENOSCOPY  11/17/2010   Procedure: ESOPHAGOGASTRODUODENOSCOPY (EGD);  Surgeon: Claudis RAYMOND Rivet, MD;  [facility];  Service: Endoscopy;  Laterality: N/A;   ESOPHAGOGASTRODUODENOSCOPY N/A 02/16/2015   Procedure: ESOPHAGOGASTRODUODENOSCOPY (EGD);  Surgeon: Elspeth Deward Naval, MD;  Location: [facility] ENDOSCOPY;  Service: Gastroenterology;  Laterality: N/A;   EXPLORATORY LAPAROTOMY  1960s   For peritonitis of undetermined cause   EYE SURGERY     INCISION AND DRAINAGE OF WOUND Right 03/15/2023   Procedure: IRRIGATION AND DEBRIDEMENT WOUND;  Surgeon: Malvin Marsa FALCON, DPM;  [facility];  Service: Orthopedics/Podiatry;  Laterality: Right;  Right ankle ulcer debridement, bone biopsy, graft application   LOWER EXTREMITY ANGIOGRAPHY N/A 07/16/2023   Procedure: Lower Extremity Angiography;  Surgeon: Serene Gaile ORN, MD;  [facility];  Service: Cardiovascular;  Laterality: N/A;   LOWER EXTREMITY ANGIOGRAPHY N/A 01/20/2024   Procedure: Lower Extremity Angiography;  Surgeon: Pearline Norman RAMAN, MD;  Location: [facility] INVASIVE CV LAB;  Service: Cardiovascular;  Laterality: N/A;   LOWER EXTREMITY INTERVENTION Left 07/16/2023   Procedure: LOWER EXTREMITY INTERVENTION;  Surgeon: Serene Gaile ORN, MD;  [facility];  Service: Cardiovascular;  Laterality: Left;  SFA laser   LOWER EXTREMITY INTERVENTION  01/20/2024   Procedure: LOWER EXTREMITY INTERVENTION;  Surgeon: Pearline Norman RAMAN, MD;  Location: [facility] INVASIVE CV LAB;  Service: Cardiovascular;;   LUMBAR EPIDURAL INJECTION  06-2012--06-2013   pt. states she has had 5 epidurals in 06-2012----06-2013   MASTECTOMY Left 1990   METATARSAL HEAD EXCISION Left 01/30/2024   Procedure: EXCISION LEFT FIFTH METATARSAL HEAD;  Surgeon: Malvin Marsa FALCON, DPM;  [facility];  Service: Orthopedics/Podiatry;  Laterality:  Left;   PERIPHERAL INTRAVASCULAR LITHOTRIPSY Left 01/20/2024   Procedure: PERIPHERAL INTRAVASCULAR LITHOTRIPSY;  Surgeon: Pearline Norman RAMAN, MD;  Location: [facility] INVASIVE CV LAB;  Service: Cardiovascular;  Laterality: Left;   PERIPHERAL VASCULAR ATHERECTOMY  08/28/2022   Procedure:  PERIPHERAL VASCULAR ATHERECTOMY;  Surgeon: Serene Gaile ORN, MD;  [facility];  Service: Cardiovascular;;  Lt Popliteal and AT   PERIPHERAL VASCULAR ATHERECTOMY  02/26/2023   Procedure: PERIPHERAL VASCULAR ATHERECTOMY;  Surgeon: Serene Gaile ORN, MD;  [facility];  Service: Cardiovascular;;   PERIPHERAL VASCULAR BALLOON ANGIOPLASTY  08/28/2022   Procedure: PERIPHERAL VASCULAR BALLOON ANGIOPLASTY;  Surgeon: Serene Gaile ORN, MD;  [facility];  Service: Cardiovascular;;  Lt Popliteal and AT   PERIPHERAL VASCULAR BALLOON ANGIOPLASTY  10/16/2022   Procedure: PERIPHERAL VASCULAR BALLOON ANGIOPLASTY;  Surgeon: Serene Gaile ORN, MD;  [facility];  Service: Cardiovascular;;  Rt AT   PERIPHERAL VASCULAR BALLOON ANGIOPLASTY  02/26/2023   Procedure: PERIPHERAL VASCULAR BALLOON ANGIOPLASTY;  Surgeon: Serene Gaile ORN, MD;  [facility];  Service: Cardiovascular;;   PERIPHERAL VASCULAR INTERVENTION Left 12/19/2021   Procedure: PERIPHERAL VASCULAR INTERVENTION;  Surgeon: Serene Gaile ORN, MD;  [facility];  Service: Cardiovascular;  Laterality: Left;  SFA and PTA of POP   YAG LASER APPLICATION Right 05/03/2015   Procedure: YAG LASER APPLICATION;  Surgeon: Oneil Platts, MD;  Location: [facility] ORS;  Service: Ophthalmology;  Laterality: Right;  right   YAG LASER APPLICATION Left 05/24/2015   Procedure: YAG LASER APPLICATION;  Surgeon: Oneil Platts, MD;  Location: [facility];  Service: Ophthalmology;  Laterality: Left;    Allergies  Allergen Reactions   Iodine  Rash and Other (See Comments)    BETADINE, blistering rash/burns   Iohexol  Rash and Other  (See Comments)    Blisters; PT NEEDS 13-HOUR PREP    Petroleum Gauze Non-Woven 3x9 [Wound Dressings] Other (See Comments)    BURNING SENSATION, RED SKIN   Sulfa  Antibiotics Nausea And Vomiting   Wound Dressing Adhesive Other (See Comments)    Burns skin   Tape Itching, Rash and Other (See Comments)    DO NOT USE ADHESIVE TAPE Not even a Band Aid NO PAPER TAPE Burns/tears skin Skin is very very thin    Immunization History  Administered Date(s) Administered    sv, Bivalent, Protein Subunit Rsvpref,pf [place]) 03/23/2022, 01/09/2023   INFLUENZA, HIGH DOSE SEASONAL PF 01/28/2016, 01/07/2017, 12/17/2018, 01/16/2022, 01/02/2023, 01/21/2024   Influenza Split 01/07/2014   Influenza,inj,Quad PF,6+ Mos 01/12/2015   Influenza,inj,quad, With Preservative 12/16/2020   Influenza-Unspecified 01/07/2013, 01/01/2022, 01/16/2022   Moderna Sars-Covid-2 Vaccination 05/02/2019, 06/01/2019, 01/22/2023   PNEUMOCOCCAL CONJUGATE-20 02/01/2023   Pneumococcal Conjugate-13 01/07/2014   Pneumococcal Polysaccharide-23 01/07/2017   Respiratory Syncytial Virus Vaccine,Recomb Aduvanted(Arexvy) 03/23/2022   Tdap 08/01/2011   Zoster Recombinant(Shingrix) 01/10/2017    Family History  Problem Relation Age of Onset   Alzheimer's disease Mother    Dementia Mother    Hypertension Mother    Hyperlipidemia Mother    Parkinsonism Father      Current Outpatient Medications:    acetaminophen  (TYLENOL ) 325 MG tablet, Take 2 tablets (650 mg total) by mouth every 6 (six) hours as needed for mild pain (pain score 1-3) (or Fever >/= 101)., Disp: 30 tablet, Rfl: 0   albuterol  (VENTOLIN  HFA) 108 (90 Base) MCG/ACT inhaler, Inhale 2 puffs into the lungs every 6 (six) hours as needed for wheezing or shortness of breath., Disp: 8 g, Rfl: 2   AMBULATORY NON FORMULARY MEDICATION, Dispense one Readi Steadi glove, Disp: 1 Units, Rfl: 0   amiodarone  (PACERONE ) 200 MG tablet, Take 200 mg by mouth every morning., Disp: , Rfl:  amLODipine  (NORVASC ) 5 MG tablet, Take 1 tablet (5 mg total) by mouth daily., Disp: 90 tablet, Rfl: 3   apixaban  (ELIQUIS ) 2.5 MG TABS tablet, Take 1 tablet (2.5 mg total) by mouth 2 (two) times daily., Disp: 180 tablet, Rfl: 1   ascorbic acid  (VITAMIN C ) 500 MG tablet, Take 500 mg by mouth in the morning., Disp: , Rfl:    CALCIUM  PO, Take 1,200 mg by mouth 2 (two) times daily. 600 mg each, Disp: , Rfl:    clopidogrel  (PLAVIX ) 75 MG tablet, Take 1 tablet (75 mg total) by mouth every morning., Disp: 90 tablet, Rfl: 3   denosumab  (PROLIA ) 60 MG/ML SOSY injection, Inject 60 mg into the skin every 6 (six) months., Disp: , Rfl:    Ferrous Sulfate  (IRON PO), Take 65 mg by mouth at bedtime., Disp: , Rfl:    HYDROcodone -acetaminophen  (NORCO/VICODIN) 5-325 MG tablet, Take 1 tablet by mouth every 8 (eight) hours as needed for moderate pain (pain score 4-6) or severe pain (pain score 7-10)., Disp: , Rfl:    levothyroxine  (SYNTHROID ) 75 MCG tablet, Take 75 mcg by mouth daily before breakfast., Disp: , Rfl:    MAGNESIUM  OXIDE PO, Take 500 mg by mouth every morning., Disp: , Rfl:    metoprolol  tartrate (LOPRESSOR ) 25 MG tablet, TAKE 3 TABLETS TWICE A DAY, Disp: 540 tablet, Rfl: 2   Multiple Vitamin (MULTIVITAMIN WITH MINERALS) TABS tablet, Take 1 tablet by mouth every morning., Disp: , Rfl:    mupirocin  ointment (BACTROBAN ) 2 %, Apply 1 Application topically daily. (Patient taking differently: Apply 1 Application topically daily. Applied to wound on lower right leg), Disp: 22 g, Rfl: 0   pantoprazole  (PROTONIX ) 40 MG tablet, TAKE 1 TABLET DAILY, Disp: 90 tablet, Rfl: 3   pravastatin  (PRAVACHOL ) 40 MG tablet, Take 1 tablet (40 mg total) by mouth daily., Disp: 90 tablet, Rfl: 1   torsemide  (DEMADEX ) 20 MG tablet, Take 1 tablet (20 mg total) by mouth 2 (two) times daily. (Patient taking differently: Take 20 mg by mouth daily.), Disp: 180 tablet, Rfl: 1   umeclidinium bromide  (INCRUSE ELLIPTA ) 62.5 MCG/ACT AEPB,  Inhale 1 puff into the lungs daily., Disp: 90 each, Rfl: 3   Camphor-Menthol-Methyl Sal (SALONPAS) 3.04-14-08 % PTCH, Place 1 patch onto the skin daily as needed (pain.). (Patient not taking: Reported on 03/18/2024), Disp: , Rfl:    docusate sodium  (COLACE) 100 MG capsule, Take 1 capsule (100 mg total) by mouth 2 (two) times daily. (Patient not taking: Reported on 03/18/2024), Disp: 10 capsule, Rfl: 0   potassium chloride  (KLOR-CON  M) 10 MEQ tablet, Take 2 tablets (20 mEq total) by mouth daily. (Patient not taking: Reported on 03/18/2024), Disp: 180 tablet, Rfl: 3      Objective:   Vitals:   03/18/24 1011  BP: 106/64  Pulse: 83  Temp: 97.7 F (36.5 C)  TempSrc: Oral  SpO2: 92%  Weight: 82 lb (37.2 kg)  Height: 5' 2 (1.575 m)    Estimated body mass index is 15 kg/m as calculated from the following:   Height as of this encounter: 5' 2 (1.575 m).   Weight as of this encounter: 82 lb (37.2 kg).  @WEIGHTCHANGE @  [employer]   03/18/24 1011  Weight: 82 lb (37.2 kg)     Physical Exam   General: No distress.  O2 at rest: no Cane present: no Sitting in wheel chair: YES Frail: YES Obese: no Neuro: Alert and Oriented x 3. GCS 15. Speech normal Psych:  Pleasant Resp:  Barrel Chest - no.  Wheeze - no, Crackles - no, No overt respiratory distress CVS: Normal heart sounds. Murmurs - no Ext: Stigmata of Connective Tissue Disease - no HEENT: Normal upper airway. PEERL +. No post nasal drip        Assessment/     Assessment & Plan COPD, moderate (HCC)  Nodule of lower lobe of left lung    PLAN Patient Instructions     ICD-10-CM   1. COPD, moderate (HCC)  J44.9     2. Nodule of lower lobe of left lung  R91.1         COPD, moderate (HCC)  -Stable COPD without flare up but increased deconditioning from recent admits causing increased shortness of breath  Plan -Continue INCRUSE daily with albuterol  as needed  - can try TRELEGY Daily instead of incruse but we  do NOT have sample  - TRY BREZTRI 2 puff twice daily  - if this works we can send in [facility] prescription because trelegy is easier to handle  - I sent albuterol  prescription  - continue night o2  Lung nodule left lower lobe - cavitary  - new in mrch 2024 and persistent in May 2024. PET scan COLD in Jun 2024  WORSE size in April 2025  and Nov 2025 -with concern for  cancer and potential spread to mediastinal adenopathy  - respect 0% desire for lung biopsy, radiation, chemo and even followup CT chest   Plan - expectant followup respecting your wishes   Follow-up - Dr. Geronimo or nurse practitioner in the next 6 months    FOLLOWUP    Return for - Dr. Geronimo or nurse practitioner in the next 6 months.    SIGNATURE    Dr. Dorethia Geronimo, M.D., F.C.C.P,  Pulmonary and Critical Care Medicine Staff Physician, [facility] Center Director - Interstitial Lung Disease  Program  Pulmonary Fibrosis [facility] Network at [facility] [place], [place], [address]  Pager: [phone], If no answer or between  15:00h - 7:00h: call [phone] Telephone: [phone]  12:54 PM 03/18/2024

## 2024-03-18 NOTE — Patient Instructions (Addendum)
 ICD-10-CM   1. COPD, moderate (HCC)  J44.9     2. Nodule of lower lobe of left lung  R91.1         COPD, moderate (HCC)  -Stable COPD without flare up but increased deconditioning from recent admits causing increased shortness of breath  Plan -Continue INCRUSE daily with albuterol  as needed  - can try TRELEGY Daily instead of incruse but we do NOT have sample  - TRY BREZTRI 2 puff twice daily  - if this works we can send in [facility] prescription because trelegy is easier to handle  - I sent albuterol  prescription  - continue night o2  Lung nodule left lower lobe - cavitary  - new in mrch 2024 and persistent in May 2024. PET scan COLD in Jun 2024  WORSE size in April 2025  and Nov 2025 -with concern for  cancer and potential spread to mediastinal adenopathy  - respect 0% desire for lung biopsy, radiation, chemo and even followup CT chest   Plan - expectant followup respecting your wishes   Follow-up - Dr. Geronimo or nurse practitioner in the next 6 months

## 2024-03-26 ENCOUNTER — Ambulatory Visit: Admitting: Podiatry

## 2024-03-26 ENCOUNTER — Encounter: Payer: Self-pay | Admitting: Podiatry

## 2024-03-26 DIAGNOSIS — Z9889 Other specified postprocedural states: Secondary | ICD-10-CM

## 2024-03-26 DIAGNOSIS — M869 Osteomyelitis, unspecified: Secondary | ICD-10-CM

## 2024-03-26 DIAGNOSIS — L97312 Non-pressure chronic ulcer of right ankle with fat layer exposed: Secondary | ICD-10-CM

## 2024-03-26 NOTE — Progress Notes (Signed)
°  Subjective:  Patient ID: [name], female    DOB: April 06, 1941,  MRN: [id number]  Chief Complaint  Patient presents with   Wound Check     Amputation second toe at MPJ level left foot. Fifth metatarsal head resection, left foot . Right foot lateral ankle wound. 0 pain.         DOS: 01/30/24 Procedure: 1.  Amputation second toe at MPJ level, left foot 2. Fifth metatarsal head resection, left foot  83 y.o. female seen for post op check.  Patient now approximately 7 weeks out from above procedure.  Patient is doing better today fully healed on the left foot no drainage.  Right lateral ankle wound continues to improve they have been doing mupirocin  and Telfa style gauze dressing  Review of Systems: Negative except as noted in the HPI. Denies N/V/F/Ch.   Objective:   Constitutional Well developed. Well nourished.  Vascular Foot warm and well perfused. Capillary refill normal to all digits.   No calf pain with palpation  Neurologic Normal speech. Oriented to person, place, and time. Epicritic sensation diminished to left foot  Dermatologic Amputation site L 2nd toe and 5th met head resection are now healed   Right lateral ankle wound appears improved from prior with increased in fill healthy tissue , no erythema or edema surrounding measures approximately 1 x 0.8 x 0.2 cm    Orthopedic: S/p L 2nd toe amputation at MPJ level, s/p L 5th metatarsal head resection.   Radiographs: Interval resection of the distal fifth metatarsal and second toe. Expected postoperative changes in the soft tissues.  Pathology:  A. TOE, LEFT SECOND, AMPUTATION:  Ulcer with necrosis and inflammation with underlying osteomyelitis.  Proximal osseous margin negative for osteomyelitis.   B. BONE, LEFT FIFTH METATARSAL HEAD, MARGIN:  Benign bone.  Negative for osteomyelitis.    Micro: NGTD bone 5th met head  Assessment:   1. Ankle ulcer, right, with fat layer exposed (HCC)   2. Osteomyelitis  of second toe of left foot (HCC)   3. Post-operative state       Osteomyelitis L 2nd toe and 5th metatarsal head S/p L 2nd toe amputation and resection 5th metatarsal head   Plan:  Patient was evaluated and treated and all questions answered.  7 week s/p above procedures -Progressing as expected post op, amputation sites on the left foot are fully healed no further care needed for those -Right ankle wound continues to improve as well continue with antibiotic ointment and dry gauze dressing change. -Lightly debrided the right lateral ankle wound with a tissue nipper to a healthy tissue bed with debridement of some fibrotic slough appears to be continuing to improve now measures 1 x 0.8 cm -XR: expected changes -WB Status: WBAT in regular shoe gear bilaterally -Medications/ABX: No antibiotics indicated -Dressing: Continue with antibiotic ointment and dry gauze dressing to the right lateral ankle wound which is improving.  No further dressings required in the left foot okay to wash left foot and apply lotion. - F/u Plan: Follow-up in 3-week for wound check to right ankle lateral        [name], DPM [facility] / [facility]

## 2024-03-30 ENCOUNTER — Other Ambulatory Visit: Payer: Self-pay

## 2024-03-30 DIAGNOSIS — I7025 Atherosclerosis of native arteries of other extremities with ulceration: Secondary | ICD-10-CM

## 2024-04-01 ENCOUNTER — Telehealth: Payer: Self-pay | Admitting: *Deleted

## 2024-04-01 ENCOUNTER — Telehealth: Payer: Self-pay

## 2024-04-01 DIAGNOSIS — J449 Chronic obstructive pulmonary disease, unspecified: Secondary | ICD-10-CM

## 2024-04-01 MED ORDER — TRELEGY ELLIPTA 100-62.5-25 MCG/ACT IN AEPB: 1.0000 | INHALATION_SPRAY | Freq: Every day | RESPIRATORY_TRACT | 3 refills | Status: AC

## 2024-04-01 NOTE — Telephone Encounter (Signed)
Pt is aware, nothing further needed.

## 2024-04-01 NOTE — Telephone Encounter (Signed)
 Copied from CRM #[id number]. Topic: Clinical - Medication Question >> Apr 01, 2024  8:42 AM Essie A wrote: Reason for CRM: Patient wants to make sure that the Trellegy inhaler is being sent to this pharmacy: [facility] - [place], [place] - [address] AT Portal to Registered [facility] [address] [place] [address] Phone: [phone] Fax: [phone] Hours: Not open 24 hours  Duplicate

## 2024-04-01 NOTE — Telephone Encounter (Signed)
 Trelegy-100 sent

## 2024-04-01 NOTE — Telephone Encounter (Signed)
 Copied from CRM #[id number]. Topic: Clinical - Medication Question >> Mar 30, 2024 12:15 PM Devaughn RAMAN wrote: Reason for CRM: Pt called and stated  BREZTRI medication is working for her and she was advised to f/u with Dr.R for a full refill of Trelegy samples as he did not have trelegy samples at the time of her appt.   Called and spoke with the pt and advised we do not have any Trelegy samples. Pt has been working well for the pt. Pt is requesting rx for Treleg[facility].   Per lov Continue INCRUSE daily with albuterol  as needed             - can try TRELEGY Daily instead of incruse but we do NOT have sample             - TRY BREZTRI 2 puff twice daily  - if this works we can send in [facility] prescription because trelegy is easier to handle  Dr. Geronimo can you please dosing for Trelegy inhaler.

## 2024-04-06 ENCOUNTER — Telehealth: Payer: Self-pay

## 2024-04-06 ENCOUNTER — Other Ambulatory Visit: Payer: Self-pay | Admitting: Cardiovascular Disease

## 2024-04-06 NOTE — Telephone Encounter (Signed)
 Spoke with patient husband okay DPR- express concern  [employer] is going to  replace the POC and see if her O2 levels are better and if not he will look into something that is continuous because he believes it has a lot to do with the pulse I agreed gave him DME number to see if they can get one through the insurance.  PCP said she can go up to 4L but they are concern about co2.       Copied from CRM #[id number]. Topic: Clinical - Medical Advice >> Apr 06, 2024  9:14 AM Corean SAUNDERS wrote: Reason for CRM: Amy, a physical therapist with [facility] is requesting a call back to determine what steps she should take with the patient as her oxygen is sitting at 84-87 in 3 liters.   Please call Amy back at [phone]

## 2024-04-07 MED ORDER — PRAVASTATIN SODIUM 40 MG PO TABS: 40.0000 mg | ORAL_TABLET | Freq: Every day | ORAL | 2 refills | Status: AC

## 2024-04-16 ENCOUNTER — Other Ambulatory Visit: Payer: Self-pay | Admitting: Podiatry

## 2024-04-16 ENCOUNTER — Encounter: Payer: Self-pay | Admitting: Podiatry

## 2024-04-16 ENCOUNTER — Ambulatory Visit (INDEPENDENT_AMBULATORY_CARE_PROVIDER_SITE_OTHER): Admitting: Podiatry

## 2024-04-16 DIAGNOSIS — I739 Peripheral vascular disease, unspecified: Secondary | ICD-10-CM

## 2024-04-16 DIAGNOSIS — L97312 Non-pressure chronic ulcer of right ankle with fat layer exposed: Secondary | ICD-10-CM | POA: Diagnosis not present

## 2024-04-16 NOTE — Addendum Note (Signed)
 Addended by: GERRIT ANDREZ CROME on: 04/16/2024 10:05 AM   Modules accepted: Orders

## 2024-04-16 NOTE — Progress Notes (Signed)
 "  Subjective:  Patient ID: [name], female    DOB: 1941-01-06,  MRN: [id number]  Chief Complaint  Patient presents with   Post-op Problem    Right foot lateral ankle wound. 0 pain.        Wound Check    DOS: 01/30/24 Procedure: 1.  Amputation second toe at MPJ level, left foot 2. Fifth metatarsal head resection, left foot  Following up primarily on the right chronic lateral ankle ulceration centered over the distal fibula/lateral malleolus  84 y.o. female seen for wound check.  She states the wound is looking about the same as it was previously no worse maybe slightly better.  Has been doing the dressings as per prior recommendation including silver  alginate gauze and Kerlix roll.  Denies much drainage.  Not taking antibiotics currently.  She is currently on palliative care plan.  Does not want to do any aggressive surgical interventions.  The left foot is doing well no concerns no wounds no evidence of infection.  Review of Systems: Negative except as noted in the HPI. Denies N/V/F/Ch.   Objective:   Constitutional Well developed. Well nourished.  Vascular Foot warm and well perfused. Capillary refill normal to all digits.   No calf pain with palpation  Neurologic Normal speech. Oriented to person, place, and time. Epicritic sensation diminished to left foot  Dermatologic Left foot fully healed amputation sites no wounds no evidence of infection   Right lateral ankle wound appears improved from prior with increased in fill healthy tissue , no erythema or edema surrounding measures approximately 0.6 x 0.8 x 0.2 cm    Orthopedic: S/p L 2nd toe amputation at MPJ level, s/p L 5th metatarsal head resection.   Radiographs: Interval resection of the distal fifth metatarsal and second toe. Expected postoperative changes in the soft tissues.  Pathology:  A. TOE, LEFT SECOND, AMPUTATION:  Ulcer with necrosis and inflammation with underlying osteomyelitis.  Proximal osseous  margin negative for osteomyelitis.   B. BONE, LEFT FIFTH METATARSAL HEAD, MARGIN:  Benign bone.  Negative for osteomyelitis.    Micro: NGTD bone 5th met head  Assessment:   1. Ankle ulcer, right, with fat layer exposed (HCC)    Chronic ulceration right lateral ankle over the lateral malleolus-question low-grade chronic osteomyelitis underlying provide healing  Osteomyelitis L 2nd toe and 5th metatarsal head S/p L 2nd toe amputation and resection 5th metatarsal head   Plan:  Patient was evaluated and treated and all questions answered.  Left foot amputations -Progressing as expected post op, amputation sites on the left foot are fully healed no further care needed for those  Right lateral ankle wound: -Right ankle wound seems to be improving slightly though it is chronic in nature and relatively stalled out at this area last time. -I do question if there is some low-grade chronic osteomyelitis underlying the wound. -Lightly debrided the right lateral ankle wound with a tissue nipper to a healthy tissue bed with debridement of some fibrotic slough appears to be continuing to improve now measures 0.6 x 0.8 cm -Discussed taking wound culture which I did today. -Will refer to infectious disease Dr. Luiz has seen the patient in the past.  Appreciate infectious disease recommendations re: should the patient be on antibiotics and for how long if there is concern for underlying osteomyelitis.  Patient does not want resection surgically and therefore we can either continue status quo or do some combination of status quo with wound care and antibiotic therapy.  -  XR: expected changes -WB Status: WBAT in regular shoe gear bilaterally -Medications/ABX: Will defer to ID wound cultures taken today -Dressing: Continue with antibiotic ointment and dry gauze dressing to the right lateral ankle wound which is improving.  No further dressings required in the left foot okay to wash left foot and apply  lotion. - F/u Plan: Follow-up in 3-week for wound check to right ankle lateral        [name], DPM [facility]  "

## 2024-04-19 LAB — AEROBIC CULTURE
MICRO NUMBER:: 17445380
SPECIMEN QUALITY:: ADEQUATE

## 2024-04-19 LAB — HOUSE ACCOUNT TRACKING

## 2024-04-28 ENCOUNTER — Other Ambulatory Visit (HOSPITAL_COMMUNITY): Payer: Self-pay | Admitting: Family Medicine

## 2024-04-28 ENCOUNTER — Ambulatory Visit

## 2024-04-28 ENCOUNTER — Ambulatory Visit (HOSPITAL_COMMUNITY)
Admission: RE | Admit: 2024-04-28 | Discharge: 2024-04-28 | Disposition: A | Discharge status: Home / Self Care | Source: Ambulatory Visit | Attending: Family Medicine | Admitting: Family Medicine

## 2024-04-28 DIAGNOSIS — R0602 Shortness of breath: Secondary | ICD-10-CM | POA: Insufficient documentation

## 2024-04-30 ENCOUNTER — Ambulatory Visit: Admitting: Internal Medicine

## 2024-04-30 ENCOUNTER — Encounter: Payer: Self-pay | Admitting: Internal Medicine

## 2024-04-30 ENCOUNTER — Other Ambulatory Visit: Payer: Self-pay

## 2024-04-30 VITALS — BP 129/66 | HR 63

## 2024-04-30 DIAGNOSIS — S81801A Unspecified open wound, right lower leg, initial encounter: Secondary | ICD-10-CM | POA: Diagnosis not present

## 2024-04-30 DIAGNOSIS — T148XXA Other injury of unspecified body region, initial encounter: Secondary | ICD-10-CM

## 2024-04-30 DIAGNOSIS — L03115 Cellulitis of right lower limb: Secondary | ICD-10-CM | POA: Diagnosis not present

## 2024-04-30 MED ORDER — LINEZOLID 600 MG PO TABS: 600.0000 mg | ORAL_TABLET | Freq: Two times a day (BID) | ORAL | 0 refills | Status: AC

## 2024-04-30 NOTE — Progress Notes (Signed)
 "     RFV: chronic right malleolus wound to right leg  Patient ID: [name], female   DOB: June 15, 1940, 84 y.o.   MRN: [id number]  HPI [name] is a here with her husband. Has been followed by dr standiford regarding chronic right malleolus wound, and associated chronic osteo. Slow to heal. She is off abtx but recently injured hte area and wound opened again. Recent light debridement and culture shows just skin flora. Has some surrounding erythema to that area.  Has just been started on amox/clav x 7 days started on 1/21 for pneumonia per pcp  Outpatient Encounter Medications as of 04/30/2024  Medication Sig   acetaminophen  (TYLENOL ) 325 MG tablet Take 2 tablets (650 mg total) by mouth every 6 (six) hours as needed for mild pain (pain score 1-3) (or Fever >/= 101).   albuterol  (VENTOLIN  HFA) 108 (90 Base) MCG/ACT inhaler Inhale 2 puffs into the lungs every 6 (six) hours as needed for wheezing or shortness of breath.   AMBULATORY NON FORMULARY MEDICATION Dispense one Readi Steadi glove   amiodarone  (PACERONE ) 200 MG tablet Take 200 mg by mouth every morning.   amLODipine  (NORVASC ) 5 MG tablet Take 1 tablet (5 mg total) by mouth daily.   apixaban  (ELIQUIS ) 2.5 MG TABS tablet Take 1 tablet (2.5 mg total) by mouth 2 (two) times daily.   ascorbic acid  (VITAMIN C ) 500 MG tablet Take 500 mg by mouth in the morning.   CALCIUM  PO Take 1,200 mg by mouth 2 (two) times daily. 600 mg each   clopidogrel  (PLAVIX ) 75 MG tablet Take 1 tablet (75 mg total) by mouth every morning.   denosumab  (PROLIA ) 60 MG/ML SOSY injection Inject 60 mg into the skin every 6 (six) months.   Ferrous Sulfate  (IRON PO) Take 65 mg by mouth at bedtime.   Fluticasone-Umeclidin-Vilant (TRELEGY ELLIPTA ) 100-62.5-25 MCG/ACT AEPB Inhale 1 puff into the lungs daily.   HYDROcodone -acetaminophen  (NORCO/VICODIN) 5-325 MG tablet Take 1 tablet by mouth every 8 (eight) hours as needed for moderate pain (pain score 4-6) or severe pain (pain score  7-10).   levothyroxine  (SYNTHROID ) 75 MCG tablet Take 75 mcg by mouth daily before breakfast.   MAGNESIUM  OXIDE PO Take 500 mg by mouth every morning.   metoprolol  tartrate (LOPRESSOR ) 25 MG tablet TAKE 3 TABLETS TWICE A DAY   Multiple Vitamin (MULTIVITAMIN WITH MINERALS) TABS tablet Take 1 tablet by mouth every morning.   mupirocin  ointment (BACTROBAN ) 2 % Apply 1 Application topically daily. (Patient taking differently: Apply 1 Application topically daily. Applied to wound on lower right leg)   pantoprazole  (PROTONIX ) 40 MG tablet TAKE 1 TABLET DAILY   pravastatin  (PRAVACHOL ) 40 MG tablet Take 1 tablet (40 mg total) by mouth daily.   torsemide  (DEMADEX ) 20 MG tablet Take 1 tablet (20 mg total) by mouth 2 (two) times daily. (Patient taking differently: Take 20 mg by mouth daily.)   Camphor-Menthol-Methyl Sal (SALONPAS) 3.04-14-08 % PTCH Place 1 patch onto the skin daily as needed (pain.). (Patient not taking: Reported on 04/30/2024)   docusate sodium  (COLACE) 100 MG capsule Take 1 capsule (100 mg total) by mouth 2 (two) times daily. (Patient not taking: Reported on 04/30/2024)   potassium chloride  (KLOR-CON  M) 10 MEQ tablet Take 2 tablets (20 mEq total) by mouth daily. (Patient not taking: Reported on 04/30/2024)   No facility-administered encounter medications on file as of 04/30/2024.     Patient Active Problem List   Diagnosis Date Noted   COPD  with acute exacerbation (HCC) 01/28/2024   Osteomyelitis (HCC) 01/28/2024   Hypertensive urgency 01/18/2024   Sinus bradycardia 01/18/2024   History of breast cancer 01/18/2024   Paroxysmal atrial fibrillation (HCC) 01/18/2024   History of COPD 01/18/2024   History of CVA (cerebrovascular accident) 01/18/2024   Hypothyroidism 01/18/2024   Peripheral artery disease 01/18/2024   Acute exacerbation of CHF (congestive heart failure) (HCC) 07/25/2023   Lobar pneumonia 07/25/2023   Wound infection 03/18/2023   Cellulitis of right lower extremity  03/14/2023   Skin ulcer of right ankle with fat layer exposed (HCC) 03/14/2023   Sepsis (HCC) 03/13/2023   CHF, acute on chronic (HCC) 11/17/2022   CKD stage 3b, GFR 30-44 ml/min (HCC) 11/17/2022   D-dimer, elevated 11/17/2022   Acute respiratory failure with hypoxia (HCC) 06/18/2022   Aspiration pneumonitis (HCC) 06/18/2022   Acute on chronic HFrEF (heart failure with reduced ejection fraction) (HCC) 06/18/2022   Hypercoagulable state due to persistent atrial fibrillation (HCC) 06/11/2022   Acute heart failure with preserved ejection fraction (HFpEF) (HCC) 05/24/2022   Cellulitis and abscess of toe of left foot 03/07/2022   GERD (gastroesophageal reflux disease) 03/06/2022   Atypical atrial flutter (HCC)    Sepsis due to undetermined organism (HCC) 12/25/2021   Diarrhea 12/25/2021   Hyperkalemia 12/25/2021   Osteomyelitis of second toe of left foot (HCC) 12/25/2021   PAD (peripheral artery disease) with history of multiple angioplasty and stent placement 12/19/2021   Osteoporosis    Tibial plateau fracture, left approx. 05/10/17 06/27/2017   COPD, moderate (HCC) 07/12/2015   Multiple gastric ulcers    Coagulopathy    Bleeding gastrointestinal    Acute GI bleeding    CAD in native artery    Supratherapeutic INR    Right lower lobe lung mass    Cavitary lesion of lung    Acute kidney injury superimposed on chronic kidney disease    Chronic back pain    GI bleed 02/14/2015   Carotid artery disease 02/14/2015   Leukocytosis 02/14/2015   Lung nodules 09/29/2014   Smoking history 09/29/2014   Pneumonia with cavity of lung 07/09/2014   Lung mass 07/09/2014   Thrombocytosis (HCC) 05/21/2014   Protein-calorie malnutrition, moderate 05/21/2014   Anemia    Generalized weakness    Necrotic pneumonia (HCC) 05/20/2014   Pneumonia 05/20/2014   Elevated liver enzymes 12/16/2013   Tremor 06/24/2013   Encounter for therapeutic drug monitoring 05/18/2013   Dizziness 01/12/2013   Wound of  ankle 10/23/2010   Long term current use of anticoagulant 07/05/2010   Carotid bruit 11/01/2009   Hyperlipidemia 10/26/2009   Essential hypertension 10/26/2009   Persistent atrial fibrillation (HCC) 10/26/2009   ACUTE ON CHRONIC DIASTOLIC HEART FAILURE 10/26/2009   SYNCOPE AND COLLAPSE 10/26/2009     Health Maintenance Due  Topic Date Due   Medicare Annual Wellness (AWV)  Never done   Zoster Vaccines- Shingrix (2 of 2) 03/07/2017   DTaP/Tdap/Td (2 - Td or Tdap) 07/31/2021   COVID-19 Vaccine (4 - 2025-26 season) 12/09/2023     Review of Systems 12 point ros is otherwise negative Physical Exam   BP 129/66   Pulse 63   SpO2 90%   Physical Exam  Constitutional:oriented to person, place, and time. appears well-developed and well-nourished. No distress.  HENT:  Mouth/Throat: Oropharynx is clear and moist. No oropharyngeal exudate.  Cardiovascular: Normal rate, regular rhythm and normal heart sounds. Exam reveals no gallop and no friction rub.  No murmur heard.  Pulmonary/Chest: Effort normal and breath sounds normal. No respiratory distress. He has no wheezes.  Neurological: He is alert and oriented to person, place, and time.  Skin: Skin is warm and dry. Hyperpigmentation due to vascular insufficiency. Ulcer on atnerior tibia, mild surrounding erythema. Psychiatric: has a normal mood and affect. behavior is normal.   CBC Lab Results  Component Value Date   WBC 14.8 (H) 02/01/2024   RBC 2.52 (L) 02/01/2024   HGB 8.2 (L) 02/01/2024   HCT 25.2 (L) 02/01/2024   PLT 386 02/01/2024   MCV 100.0 02/01/2024   MCH 32.5 02/01/2024   MCHC 32.5 02/01/2024   RDW 16.3 (H) 02/01/2024   LYMPHSABS 0.6 (L) 01/28/2024   MONOABS 1.5 (H) 01/28/2024   EOSABS 0.1 01/28/2024    BMET Lab Results  Component Value Date   NA 139 01/31/2024   K 4.4 01/31/2024   CL 108 01/31/2024   CO2 21 (L) 01/31/2024   GLUCOSE 89 01/31/2024   BUN 30 (H) 01/31/2024   CREATININE 1.65 (H) 01/31/2024    CALCIUM  7.2 (L) 01/31/2024   GFRNONAA 30 (L) 01/31/2024   GFRAA >60 02/20/2015      Assessment and Plan Right leg anterior tibia wound = Will give 10 days of cefadroxil to take after she completes linezolid  course to finish out treatment course for her cellulitis/wound   "

## 2024-05-05 ENCOUNTER — Ambulatory Visit

## 2024-05-07 ENCOUNTER — Encounter: Payer: Self-pay | Admitting: Podiatry

## 2024-05-07 ENCOUNTER — Ambulatory Visit (INDEPENDENT_AMBULATORY_CARE_PROVIDER_SITE_OTHER): Admitting: Podiatry

## 2024-05-07 DIAGNOSIS — I739 Peripheral vascular disease, unspecified: Secondary | ICD-10-CM

## 2024-05-07 DIAGNOSIS — L97312 Non-pressure chronic ulcer of right ankle with fat layer exposed: Secondary | ICD-10-CM

## 2024-05-07 NOTE — Progress Notes (Signed)
 "  Subjective:  Patient ID: [name], female    DOB: 13-Apr-1940,  MRN: [id number]  Chief Complaint  Patient presents with   Wound Check    Right foot lateral ankle wound. 0 pain. Taking antibiotics.     DOS: 01/30/24 Procedure: 1.  Amputation second toe at MPJ level, left foot 2. Fifth metatarsal head resection, left foot  Following up primarily on the right chronic lateral ankle ulceration centered over the distal fibula/lateral malleolus.  84 y.o. female seen for wound check.   Patient recently saw Dr. Juli with infectious disease.  She put her on antibiotics.  She has follow-up with her in 3 weeks.  Dr. Luiz reportedly did not think there was osteomyelitis underlying the wound.  The patient reports the wound has been doing well she has been doing dressing changes routinely as per prior recommendations  Review of Systems: Negative except as noted in the HPI. Denies N/V/F/Ch.   Objective:   Constitutional Well developed. Well nourished.  Vascular Foot warm and well perfused. Capillary refill normal to all digits.   No calf pain with palpation  Neurologic Normal speech. Oriented to person, place, and time. Epicritic sensation diminished to left foot  Dermatologic Left foot fully healed amputation sites no wounds no evidence of infection  Right lateral ankle wound appears improved from prior with increased in fill healthy tissue , no erythema or edema surrounding measures approximately 0.5 x 0.8 x 0.2 cm    Orthopedic: S/p L 2nd toe amputation at MPJ level, s/p L 5th metatarsal head resection.   Radiographs: Interval resection of the distal fifth metatarsal and second toe. Expected postoperative changes in the soft tissues.  Pathology:  A. TOE, LEFT SECOND, AMPUTATION:  Ulcer with necrosis and inflammation with underlying osteomyelitis.  Proximal osseous margin negative for osteomyelitis.   B. BONE, LEFT FIFTH METATARSAL HEAD, MARGIN:  Benign bone.  Negative for  osteomyelitis.    Micro: NGTD bone 5th met head  Assessment:   1. Ankle ulcer, right, with fat layer exposed (HCC)   2. PAD (peripheral artery disease) with history of multiple angioplasty and stent placement     Chronic ulceration right lateral ankle over the lateral malleolus-question low-grade chronic osteomyelitis underlying provide healing  Osteomyelitis L 2nd toe and 5th metatarsal head S/p L 2nd toe amputation and resection 5th metatarsal head   Plan:  Patient was evaluated and treated and all questions answered.  Left foot amputations -Progressing as expected post op, amputation sites on the left foot are fully healed no further care needed for those  Right lateral ankle wound: -Right ankle wound seems to be improving slightly and slowly with time - Appreciate Dr. Juli evaluating the patient, she has been placed on Zyvox  for 10 days -Lightly debrided the right lateral ankle wound with a tissue nipper to a healthy tissue bed with debridement of some fibrotic slough appears to be continuing to improve now measures 0.5 x 0.8 cm - Prior wound culture taken did not grow anything - Continue with local wound care as we have been doing  -XR: expected changes -WB Status: WBAT in regular shoe gear bilaterally -Medications/ABX: Will defer to ID wound cultures taken today -Dressing: Continue with antibiotic ointment and dry gauze dressing to the right lateral ankle wound which is improving.  No further dressings required in the left foot okay to wash left foot and apply lotion. - F/u Plan: Follow-up in 3-week for wound check to right ankle lateral  [name], DPM [facility]  "

## 2024-05-15 ENCOUNTER — Other Ambulatory Visit: Payer: Self-pay | Admitting: Emergency Medicine

## 2024-05-28 ENCOUNTER — Ambulatory Visit: Admitting: Podiatry

## 2024-06-03 ENCOUNTER — Ambulatory Visit: Payer: Self-pay | Admitting: Internal Medicine

## 2024-06-15 ENCOUNTER — Ambulatory Visit

## 2024-06-30 ENCOUNTER — Ambulatory Visit

## 2024-06-30 ENCOUNTER — Encounter

## 2024-07-02 ENCOUNTER — Ambulatory Visit: Admitting: Cardiovascular Disease

## 2024-09-15 ENCOUNTER — Ambulatory Visit: Admitting: Internal Medicine
